# Patient Record
Sex: Female | Born: 1950 | Race: White | Hispanic: No | Smoking: Former smoker
Health system: Southern US, Community
[De-identification: ages and names within clinical notes are randomized; demographics above are authoritative.]

## PROBLEM LIST (undated history)

## (undated) DIAGNOSIS — I10 Essential (primary) hypertension: Secondary | ICD-10-CM

## (undated) DIAGNOSIS — S065XAA Traumatic subdural hemorrhage with loss of consciousness status unknown, initial encounter: Secondary | ICD-10-CM

## (undated) DIAGNOSIS — C3490 Malignant neoplasm of unspecified part of unspecified bronchus or lung: Secondary | ICD-10-CM

## (undated) DIAGNOSIS — F32A Depression, unspecified: Secondary | ICD-10-CM

## (undated) DIAGNOSIS — F0390 Unspecified dementia without behavioral disturbance: Secondary | ICD-10-CM

## (undated) DIAGNOSIS — K219 Gastro-esophageal reflux disease without esophagitis: Secondary | ICD-10-CM

## (undated) DIAGNOSIS — R569 Unspecified convulsions: Secondary | ICD-10-CM

## (undated) DIAGNOSIS — K7460 Unspecified cirrhosis of liver: Secondary | ICD-10-CM

## (undated) DIAGNOSIS — IMO0001 Reserved for inherently not codable concepts without codable children: Secondary | ICD-10-CM

## (undated) DIAGNOSIS — M1990 Unspecified osteoarthritis, unspecified site: Secondary | ICD-10-CM

## (undated) DIAGNOSIS — F329 Major depressive disorder, single episode, unspecified: Secondary | ICD-10-CM

## (undated) DIAGNOSIS — E785 Hyperlipidemia, unspecified: Secondary | ICD-10-CM

## (undated) DIAGNOSIS — C419 Malignant neoplasm of bone and articular cartilage, unspecified: Secondary | ICD-10-CM

## (undated) HISTORY — DX: Malignant neoplasm of bone and articular cartilage, unspecified: C41.9

## (undated) HISTORY — DX: Reserved for inherently not codable concepts without codable children: IMO0001

## (undated) HISTORY — DX: Unspecified convulsions: R56.9

## (undated) HISTORY — DX: Essential (primary) hypertension: I10

## (undated) HISTORY — DX: Malignant neoplasm of unspecified part of unspecified bronchus or lung: C34.90

## (undated) HISTORY — DX: Unspecified osteoarthritis, unspecified site: M19.90

## (undated) HISTORY — DX: Hyperlipidemia, unspecified: E78.5

## (undated) HISTORY — DX: Depression, unspecified: F32.A

## (undated) HISTORY — PX: TONSILLECTOMY: SUR1361

## (undated) HISTORY — DX: Major depressive disorder, single episode, unspecified: F32.9

## (undated) HISTORY — DX: Traumatic subdural hemorrhage with loss of consciousness status unknown, initial encounter: S06.5XAA

---
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
## 1969-02-05 HISTORY — PX: CHOLECYSTECTOMY: SHX55

## 1969-02-05 HISTORY — PX: APPENDECTOMY: SHX54

## 1997-09-17 ENCOUNTER — Ambulatory Visit (HOSPITAL_BASED_OUTPATIENT_CLINIC_OR_DEPARTMENT_OTHER): Admission: RE | Admit: 1997-09-17 | Discharge: 1997-09-17 | Payer: Self-pay | Admitting: Orthopedic Surgery

## 1997-12-23 ENCOUNTER — Inpatient Hospital Stay (HOSPITAL_COMMUNITY): Admission: EM | Admit: 1997-12-23 | Discharge: 1997-12-26 | Payer: Self-pay | Admitting: Emergency Medicine

## 1998-01-26 ENCOUNTER — Other Ambulatory Visit: Admission: RE | Admit: 1998-01-26 | Discharge: 1998-01-26 | Payer: Self-pay

## 1999-04-03 ENCOUNTER — Other Ambulatory Visit: Admission: RE | Admit: 1999-04-03 | Discharge: 1999-04-03 | Payer: Self-pay | Admitting: Internal Medicine

## 1999-05-05 ENCOUNTER — Encounter (INDEPENDENT_AMBULATORY_CARE_PROVIDER_SITE_OTHER): Payer: Self-pay | Admitting: Specialist

## 1999-05-05 ENCOUNTER — Ambulatory Visit (HOSPITAL_COMMUNITY): Admission: RE | Admit: 1999-05-05 | Discharge: 1999-05-05 | Payer: Self-pay | Admitting: *Deleted

## 2004-02-12 ENCOUNTER — Emergency Department: Payer: Self-pay | Admitting: Emergency Medicine

## 2004-06-21 ENCOUNTER — Ambulatory Visit: Payer: Self-pay | Admitting: Internal Medicine

## 2004-06-23 ENCOUNTER — Inpatient Hospital Stay: Payer: Self-pay | Admitting: Surgery

## 2004-06-29 ENCOUNTER — Other Ambulatory Visit: Payer: Self-pay

## 2008-08-18 ENCOUNTER — Emergency Department: Payer: Self-pay | Admitting: Emergency Medicine

## 2009-05-02 ENCOUNTER — Encounter: Payer: Self-pay | Admitting: Sports Medicine

## 2009-05-06 ENCOUNTER — Encounter: Payer: Self-pay | Admitting: Sports Medicine

## 2009-06-05 ENCOUNTER — Encounter: Payer: Self-pay | Admitting: Sports Medicine

## 2009-06-13 ENCOUNTER — Ambulatory Visit: Payer: Self-pay

## 2009-07-06 ENCOUNTER — Encounter: Payer: Self-pay | Admitting: Sports Medicine

## 2009-08-16 ENCOUNTER — Inpatient Hospital Stay (HOSPITAL_COMMUNITY): Admission: EM | Admit: 2009-08-16 | Discharge: 2009-08-17 | Payer: Self-pay | Admitting: Neurosurgery

## 2009-08-16 ENCOUNTER — Ambulatory Visit: Payer: Self-pay | Admitting: Internal Medicine

## 2009-10-07 ENCOUNTER — Encounter: Payer: Self-pay | Admitting: Neurosurgery

## 2009-11-14 ENCOUNTER — Ambulatory Visit: Payer: Self-pay | Admitting: Neurosurgery

## 2009-12-19 ENCOUNTER — Ambulatory Visit (HOSPITAL_COMMUNITY): Admission: RE | Admit: 2009-12-19 | Discharge: 2009-12-19 | Payer: Self-pay | Admitting: Neurosurgery

## 2010-04-18 LAB — SURGICAL PCR SCREEN
MRSA, PCR: NEGATIVE
Staphylococcus aureus: NEGATIVE

## 2010-04-18 LAB — DIFFERENTIAL
Basophils Absolute: 0.1 10*3/uL (ref 0.0–0.1)
Basophils Relative: 1 % (ref 0–1)
Lymphocytes Relative: 32 % (ref 12–46)
Monocytes Absolute: 0.7 10*3/uL (ref 0.1–1.0)
Neutro Abs: 6.4 10*3/uL (ref 1.7–7.7)
Neutrophils Relative %: 57 % (ref 43–77)

## 2010-04-18 LAB — BASIC METABOLIC PANEL
BUN: 11 mg/dL (ref 6–23)
Calcium: 10.1 mg/dL (ref 8.4–10.5)
GFR calc non Af Amer: 53 mL/min — ABNORMAL LOW (ref >60)
Glucose, Bld: 91 mg/dL (ref 70–99)
Potassium: 3.6 mEq/L (ref 3.5–5.1)
Sodium: 137 mEq/L (ref 135–145)

## 2010-04-18 LAB — CBC
HCT: 51.4 % — ABNORMAL HIGH (ref 36.0–46.0)
MCHC: 33.5 g/dL (ref 30.0–36.0)
RDW: 14.9 % (ref 11.5–15.5)
WBC: 11.2 10*3/uL — ABNORMAL HIGH (ref 4.0–10.5)

## 2010-04-23 LAB — BLOOD GAS, ARTERIAL
Acid-base deficit: 2.4 mmol/L — ABNORMAL HIGH (ref 0.0–2.0)
FIO2: 0.5 %
Mode: POSITIVE
O2 Saturation: 93.7 %
TCO2: 25 mmol/L (ref 0–100)
pCO2 arterial: 51.4 mmHg — ABNORMAL HIGH (ref 35.0–45.0)

## 2010-04-23 LAB — CARDIAC PANEL(CRET KIN+CKTOT+MB+TROPI)
Relative Index: INVALID (ref 0.0–2.5)
Troponin I: 0.21 ng/mL — ABNORMAL HIGH (ref 0.00–0.06)
Troponin I: 0.34 ng/mL — ABNORMAL HIGH (ref 0.00–0.06)

## 2010-04-23 LAB — GLUCOSE, CAPILLARY
Glucose-Capillary: 109 mg/dL — ABNORMAL HIGH (ref 70–99)
Glucose-Capillary: 135 mg/dL — ABNORMAL HIGH (ref 70–99)
Glucose-Capillary: 94 mg/dL (ref 70–99)

## 2010-04-23 LAB — CBC
HCT: 39.1 % (ref 36.0–46.0)
Hemoglobin: 13.1 g/dL (ref 12.0–15.0)
MCH: 30.7 pg (ref 26.0–34.0)
MCHC: 33.5 g/dL (ref 30.0–36.0)
MCV: 91.6 fL (ref 78.0–100.0)
RDW: 15.4 % (ref 11.5–15.5)

## 2010-04-23 LAB — COMPREHENSIVE METABOLIC PANEL
ALT: 66 U/L — ABNORMAL HIGH (ref 0–35)
BUN: 13 mg/dL (ref 6–23)
CO2: 26 mEq/L (ref 19–32)
Calcium: 8.4 mg/dL (ref 8.4–10.5)
Creatinine, Ser: 1.19 mg/dL (ref 0.4–1.2)
GFR calc non Af Amer: 46 mL/min — ABNORMAL LOW (ref >60)
Glucose, Bld: 94 mg/dL (ref 70–99)
Sodium: 139 mEq/L (ref 135–145)
Total Protein: 5.8 g/dL — ABNORMAL LOW (ref 6.0–8.3)

## 2010-04-23 LAB — MRSA PCR SCREENING: MRSA by PCR: NEGATIVE

## 2010-04-23 LAB — TSH: TSH: 2.721 u[IU]/mL (ref 0.350–4.500)

## 2010-06-09 ENCOUNTER — Ambulatory Visit: Payer: Self-pay | Admitting: Ophthalmology

## 2010-06-09 DIAGNOSIS — I119 Hypertensive heart disease without heart failure: Secondary | ICD-10-CM

## 2010-06-26 ENCOUNTER — Ambulatory Visit: Payer: Self-pay | Admitting: Ophthalmology

## 2010-07-11 ENCOUNTER — Ambulatory Visit: Payer: Self-pay | Admitting: Ophthalmology

## 2010-07-17 ENCOUNTER — Ambulatory Visit: Payer: Self-pay | Admitting: Ophthalmology

## 2011-07-20 ENCOUNTER — Emergency Department: Payer: Self-pay | Admitting: Emergency Medicine

## 2012-02-26 ENCOUNTER — Ambulatory Visit: Payer: Self-pay | Admitting: Neurosurgery

## 2012-02-26 LAB — CREATININE, SERUM
EGFR (African American): >60
EGFR (Non-African Amer.): 54 — ABNORMAL LOW

## 2013-10-11 ENCOUNTER — Emergency Department: Payer: Self-pay | Admitting: Emergency Medicine

## 2015-02-18 ENCOUNTER — Ambulatory Visit: Payer: Self-pay | Admitting: Nurse Practitioner

## 2015-02-18 ENCOUNTER — Ambulatory Visit (INDEPENDENT_AMBULATORY_CARE_PROVIDER_SITE_OTHER): Payer: PPO | Admitting: Nurse Practitioner

## 2015-02-18 ENCOUNTER — Encounter: Payer: Self-pay | Admitting: Nurse Practitioner

## 2015-02-18 VITALS — BP 116/68 | HR 89 | Temp 98.3°F | Resp 16 | Ht 65.0 in | Wt 206.2 lb

## 2015-02-18 DIAGNOSIS — Z13820 Encounter for screening for osteoporosis: Secondary | ICD-10-CM

## 2015-02-18 DIAGNOSIS — F32A Depression, unspecified: Secondary | ICD-10-CM

## 2015-02-18 DIAGNOSIS — Z1239 Encounter for other screening for malignant neoplasm of breast: Secondary | ICD-10-CM

## 2015-02-18 DIAGNOSIS — F329 Major depressive disorder, single episode, unspecified: Secondary | ICD-10-CM

## 2015-02-18 DIAGNOSIS — Z7189 Other specified counseling: Secondary | ICD-10-CM | POA: Diagnosis not present

## 2015-02-18 DIAGNOSIS — I10 Essential (primary) hypertension: Secondary | ICD-10-CM

## 2015-02-18 DIAGNOSIS — E785 Hyperlipidemia, unspecified: Secondary | ICD-10-CM

## 2015-02-18 DIAGNOSIS — Z7689 Persons encountering health services in other specified circumstances: Secondary | ICD-10-CM

## 2015-02-18 NOTE — Patient Instructions (Addendum)
Bone density (every 5 years)  Breast cancer screening every 1-2 years  Please call and set up your appointment- orders are in!  [facility] at [facility]  Address: [address] Madelaine Bhat [place], [place] [address]  Phone: [phone] Hours:  Monday - Thursday: 8 a.m. - 5 p.m. Friday: 8 a.m. - 3 p.m.

## 2015-02-18 NOTE — Progress Notes (Addendum)
Patient ID: Anne [name], female    DOB: [birth year]  Age: 65 y.o. MRN: [id number]  CC: Establish Care   HPI Moca presents for establishing care today.   1) New Pt Info:   Immunizations- Unknown   Mammogram- Will obtain   Bone Density- Will obtain  Colonoscopy- Never had  History Jonda has a past medical history of Hypertension; Hyperlipidemia; Depression; and Arthritis.   She has past surgical history that includes Cholecystectomy (1971); Appendectomy (1971); and Tonsilectomy, adenoidectomy, bilateral myringotomy and tubes (1955).   Her family history includes Cancer in her mother; Heart disease in her father; Hypertension in her mother; Stroke in her father.She reports that she quit smoking about 3 years ago. She does not have any smokeless tobacco history on file. She reports that she drinks alcohol. She reports that she does not use illicit drugs.  No outpatient prescriptions prior to visit.   No facility-administered medications prior to visit.    ROS Review of Systems  Constitutional: Negative for fever, chills, diaphoresis and fatigue.  Respiratory: Negative for chest tightness, shortness of breath and wheezing.   Cardiovascular: Negative for chest pain, palpitations and leg swelling.  Gastrointestinal: Negative for nausea, vomiting and diarrhea.  Skin: Negative for rash.  Neurological: Negative for dizziness, weakness, numbness and headaches.  Psychiatric/Behavioral: The patient is not nervous/anxious.     Objective:  BP 116/68 mmHg  Pulse 89  Temp(Src) 98.3 F (36.8 C) (Oral)  Resp 16  Ht '5\' 5"'$  (1.651 m)  Wt 206 lb 3.2 oz (93.532 kg)  BMI 34.31 kg/m2  SpO2 91%  Physical Exam  Constitutional: She is oriented to person, place, and time. She appears well-developed and well-nourished. No distress.  HENT:  Head: Normocephalic and atraumatic.  Right Ear: External ear normal.  Left Ear: External ear normal.  Cardiovascular: Normal rate, regular rhythm  and normal heart sounds.  Exam reveals no gallop and no friction rub.   No murmur heard. Pulmonary/Chest: Effort normal and breath sounds normal. No respiratory distress. She has no wheezes. She has no rales. She exhibits no tenderness.  Neurological: She is alert and oriented to person, place, and time. No cranial nerve deficit. She exhibits normal muscle tone. Coordination normal.  Skin: Skin is warm and dry. No rash noted. She is not diaphoretic.  Psychiatric: She has a normal mood and affect. Her behavior is normal. Judgment and thought content normal.      Assessment & Plan:   Anne [name] was seen today for establish care.  Diagnoses and all orders for this visit:  Screening for breast cancer -     MM Digital Screening; Future  Screening for osteoporosis -     DG Bone Density; Future  Encounter to establish care  Benign essential HTN  HLD (hyperlipidemia)  Depression   I have discontinued Anne [name]'s Vitamin D (Ergocalciferol). I am also having her maintain her lovastatin, DHA-Vitamin C-Lutein (EYE HEALTH FORMULA PO), triamterene-hydrochlorothiazide, PARoxetine, FISH OIL + D3, St Johns Wort, and Vitamin D3.  Meds ordered this encounter  Medications  . lovastatin (MEVACOR) 20 MG tablet    Sig: Take 20 mg by mouth at bedtime.  Marland Kitchen DHA-Vitamin C-Lutein (EYE HEALTH FORMULA PO)    Sig: Take by mouth.  . triamterene-hydrochlorothiazide (MAXZIDE-25) 37.5-25 MG tablet    Sig: Take 1 tablet by mouth daily.  Marland Kitchen PARoxetine (PAXIL) 20 MG tablet    Sig: Take 20 mg by mouth daily.  Marland Kitchen DISCONTD: Vitamin D, Ergocalciferol, (DRISDOL) 50000 units  CAPS capsule    Sig: Take 50,000 Units by mouth every 7 (seven) days.  . Fish Oil-Cholecalciferol (FISH OIL + D3) 1000-1000 MG-UNIT CAPS    Sig: Take by mouth.  Francella Solian Johns Wort 150 MG CAPS    Sig: Take 1 capsule by mouth daily.  . Cholecalciferol (VITAMIN D3) 1000 units CAPS    Sig: Take 1 capsule by mouth daily.     Follow-up: Return in  about 4 weeks (around 03/18/2015) for Follow up for health maintanence .

## 2015-02-22 ENCOUNTER — Encounter: Payer: Self-pay | Admitting: Nurse Practitioner

## 2015-02-22 DIAGNOSIS — F32A Depression, unspecified: Secondary | ICD-10-CM | POA: Insufficient documentation

## 2015-02-22 DIAGNOSIS — I10 Essential (primary) hypertension: Secondary | ICD-10-CM | POA: Insufficient documentation

## 2015-02-22 DIAGNOSIS — E785 Hyperlipidemia, unspecified: Secondary | ICD-10-CM | POA: Insufficient documentation

## 2015-02-22 DIAGNOSIS — Z13820 Encounter for screening for osteoporosis: Secondary | ICD-10-CM | POA: Insufficient documentation

## 2015-02-22 DIAGNOSIS — F329 Major depressive disorder, single episode, unspecified: Secondary | ICD-10-CM | POA: Insufficient documentation

## 2015-02-22 DIAGNOSIS — Z7689 Persons encountering health services in other specified circumstances: Secondary | ICD-10-CM | POA: Insufficient documentation

## 2015-02-22 DIAGNOSIS — Z1239 Encounter for other screening for malignant neoplasm of breast: Secondary | ICD-10-CM | POA: Insufficient documentation

## 2015-02-22 NOTE — Assessment & Plan Note (Signed)
Ordered Asked pt to call and set up See AVS

## 2015-02-22 NOTE — Assessment & Plan Note (Signed)
Discussed acute and chronic issues. Reviewed health maintenance measures, PFSHx, and immunizations. Obtain records from Dr. Lewie Loron office

## 2015-02-22 NOTE — Assessment & Plan Note (Signed)
Stable. On Mevacor 30 mg Will obtain last labs

## 2015-02-22 NOTE — Assessment & Plan Note (Signed)
BP Readings from Last 3 Encounters:  02/18/15 116/68   Stable. Continue current measures

## 2015-02-22 NOTE — Assessment & Plan Note (Signed)
Stable. On Paxil 20 mg and St. Johns wort Will obtain records

## 2015-02-22 NOTE — Assessment & Plan Note (Signed)
See AVS Ordered Asked pt to set up

## 2015-02-25 ENCOUNTER — Other Ambulatory Visit: Payer: Self-pay

## 2015-02-25 MED ORDER — LOVASTATIN 20 MG PO TABS: 20.0000 mg | ORAL_TABLET | Freq: Every day | ORAL | Status: DC

## 2015-03-03 ENCOUNTER — Ambulatory Visit: Payer: PPO

## 2015-03-09 ENCOUNTER — Ambulatory Visit
Admission: RE | Admit: 2015-03-09 | Discharge: 2015-03-09 | Disposition: A | Discharge status: Home / Self Care | Payer: PPO | Source: Ambulatory Visit | Attending: Nurse Practitioner | Admitting: Nurse Practitioner

## 2015-03-09 DIAGNOSIS — Z1231 Encounter for screening mammogram for malignant neoplasm of breast: Secondary | ICD-10-CM | POA: Insufficient documentation

## 2015-03-09 DIAGNOSIS — M810 Age-related osteoporosis without current pathological fracture: Secondary | ICD-10-CM | POA: Diagnosis not present

## 2015-03-09 DIAGNOSIS — Z1239 Encounter for other screening for malignant neoplasm of breast: Secondary | ICD-10-CM

## 2015-03-09 DIAGNOSIS — Z13820 Encounter for screening for osteoporosis: Secondary | ICD-10-CM | POA: Diagnosis not present

## 2015-03-16 ENCOUNTER — Other Ambulatory Visit: Payer: Self-pay

## 2015-03-16 MED ORDER — TRIAMTERENE-HCTZ 37.5-25 MG PO TABS: 1.0000 | ORAL_TABLET | Freq: Every day | ORAL | Status: DC

## 2015-03-16 NOTE — Telephone Encounter (Signed)
You have not prescribed this for the patient before, please advise.  Last OV was 02/18/2015

## 2015-03-17 ENCOUNTER — Ambulatory Visit (INDEPENDENT_AMBULATORY_CARE_PROVIDER_SITE_OTHER): Payer: PPO | Admitting: Nurse Practitioner

## 2015-03-17 ENCOUNTER — Encounter: Payer: Self-pay | Admitting: Nurse Practitioner

## 2015-03-17 VITALS — BP 138/70 | HR 81 | Temp 98.0°F | Resp 14 | Ht 65.0 in | Wt 210.8 lb

## 2015-03-17 DIAGNOSIS — E785 Hyperlipidemia, unspecified: Secondary | ICD-10-CM

## 2015-03-17 DIAGNOSIS — Z1239 Encounter for other screening for malignant neoplasm of breast: Secondary | ICD-10-CM | POA: Diagnosis not present

## 2015-03-17 DIAGNOSIS — Z13820 Encounter for screening for osteoporosis: Secondary | ICD-10-CM

## 2015-03-17 DIAGNOSIS — I10 Essential (primary) hypertension: Secondary | ICD-10-CM | POA: Diagnosis not present

## 2015-03-17 LAB — CBC WITH DIFFERENTIAL/PLATELET
BASOS ABS: 0 10*3/uL (ref 0.0–0.1)
BASOS PCT: 0.3 % (ref 0.0–3.0)
EOS ABS: 0.2 10*3/uL (ref 0.0–0.7)
Eosinophils Relative: 3 % (ref 0.0–5.0)
HEMATOCRIT: 41.2 % (ref 36.0–46.0)
HEMOGLOBIN: 13.3 g/dL (ref 12.0–15.0)
LYMPHS ABS: 2 10*3/uL (ref 0.7–4.0)
LYMPHS PCT: 33.1 % (ref 12.0–46.0)
MCHC: 32.3 g/dL (ref 30.0–36.0)
MCV: 85.3 fl (ref 78.0–100.0)
MONOS PCT: 5.2 % (ref 3.0–12.0)
Monocytes Absolute: 0.3 10*3/uL (ref 0.1–1.0)
NEUTROS ABS: 3.6 10*3/uL (ref 1.4–7.7)
Neutrophils Relative %: 58.4 % (ref 43.0–77.0)
Platelets: 187 10*3/uL (ref 150.0–400.0)
RBC: 4.83 Mil/uL (ref 3.87–5.11)
RDW: 15.5 % (ref 11.5–15.5)
WBC: 6.2 10*3/uL (ref 4.0–10.5)

## 2015-03-17 LAB — BASIC METABOLIC PANEL
BUN: 15 mg/dL (ref 6–23)
CHLORIDE: 102 meq/L (ref 96–112)
CO2: 29 mEq/L (ref 19–32)
Calcium: 9.8 mg/dL (ref 8.4–10.5)
Creatinine, Ser: 0.86 mg/dL (ref 0.40–1.20)
GFR: 69.95 mL/min (ref >60.00)
GLUCOSE: 98 mg/dL (ref 70–99)
POTASSIUM: 3.6 meq/L (ref 3.5–5.1)
Sodium: 139 mEq/L (ref 135–145)

## 2015-03-17 LAB — LIPID PANEL
CHOL/HDL RATIO: 4
Cholesterol: 205 mg/dL — ABNORMAL HIGH (ref 0–200)
HDL: 47.2 mg/dL (ref >39.00)
NONHDL: 157.66
Triglycerides: 244 mg/dL — ABNORMAL HIGH (ref 0.0–149.0)
VLDL: 48.8 mg/dL — ABNORMAL HIGH (ref 0.0–40.0)

## 2015-03-17 LAB — LDL CHOLESTEROL, DIRECT: LDL DIRECT: 118 mg/dL

## 2015-03-17 LAB — HEMOGLOBIN A1C: Hgb A1c MFr Bld: 6 % (ref 4.6–6.5)

## 2015-03-17 NOTE — Patient Instructions (Addendum)
Please visit the lab today.   Good to see you!   If you need a physical or wellness visit- please set this up before leaving.

## 2015-03-17 NOTE — Progress Notes (Signed)
Pre visit review using our clinic review tool, if applicable. No additional management support is needed unless otherwise documented below in the visit note. 

## 2015-03-17 NOTE — Progress Notes (Signed)
Patient ID: Anne [name], female    DOB: 1950/02/10  Age: 65 y.o. MRN: [id number]  CC: Follow-up   HPI Anne [name] presents for follow up of mammogram and bone density results.   1) Bone Density- normal  ASSESSMENT: The BMD measured at AP Spine L1-L3 is 1.167 g/cm2 with a T-score of -0.1. This patient is considered normal according to [facility]) criteria. L- 4was excluded due to degenerative changes.  AP Spine L1-L3 03/09/2015 66.9 Normal -0.1 1.167 g/cm2 DualFemur Neck Left 03/09/2015 66.9 Normal 0.0 1.035 g/cm2   2) Mammogram- normal results Bi-Rads 1 with fatty tissue  Pt has questions about what this means  3) HTN/HLD- stable on current regimens- needs lab work   History Anne [name] has a past medical history of Hypertension; Hyperlipidemia; Depression; and Arthritis.   Anne [name] has past surgical history that includes Cholecystectomy (1971); Appendectomy (1971); and Tonsilectomy, adenoidectomy, bilateral myringotomy and tubes (1955).   Her family history includes Cancer in her mother; Heart disease in her father; Hypertension in her mother; Ovarian cancer in her sister; Ovarian cancer (age of onset: 68) in her mother; Stroke in her father. There is no history of Breast cancer.Anne [name] reports that Anne [name] quit smoking about 3 years ago. Anne [name] does not have any smokeless tobacco history on file. Anne [name] reports that Anne [name] drinks alcohol. Anne [name] reports that Anne [name] does not use illicit drugs.  Outpatient Prescriptions Prior to Visit  Medication Sig Dispense Refill  . Cholecalciferol (VITAMIN D3) 1000 units CAPS Take 1 capsule by mouth daily.    Marland Kitchen DHA-Vitamin C-Lutein (EYE HEALTH FORMULA PO) Take by mouth.    . Fish Oil-Cholecalciferol (FISH OIL + D3) 1000-1000 MG-UNIT CAPS Take by mouth.    . lovastatin (MEVACOR) 20 MG tablet Take 1 tablet (20 mg total) by mouth at bedtime. 60 tablet 3  . PARoxetine (PAXIL) 20 MG tablet Take 20 mg by mouth daily.    Francella Solian Johns Wort 150 MG CAPS Take 1  capsule by mouth daily.    Marland Kitchen triamterene-hydrochlorothiazide (MAXZIDE-25) 37.5-25 MG tablet Take 1 tablet by mouth daily. 30 tablet 2   No facility-administered medications prior to visit.    ROS Review of Systems  Constitutional: Negative for fever, chills, diaphoresis and fatigue.  Musculoskeletal: Negative for myalgias, back pain, joint swelling, arthralgias, gait problem, neck pain and neck stiffness.    Objective:  BP 138/70 mmHg  Pulse 81  Temp(Src) 98 F (36.7 C) (Oral)  Resp 14  Ht '5\' 5"'$  (1.651 m)  Wt 210 lb 12.8 oz (95.618 kg)  BMI 35.08 kg/m2  SpO2 95%  Physical Exam  Constitutional: Anne [name] is oriented to person, place, and time. Anne [name] appears well-developed and well-nourished. No distress.  Cardiovascular: Normal rate and regular rhythm.   Pulmonary/Chest: Effort normal and breath sounds normal.  Neurological: Anne [name] is alert and oriented to person, place, and time. Coordination normal.  Skin: Skin is warm and dry. No rash noted. Anne [name] is not diaphoretic.  Psychiatric: Anne [name] has a normal mood and affect. Her behavior is normal. Judgment and thought content normal.   Assessment & Plan:   Anne [name] was seen today for follow-up.  Diagnoses and all orders for this visit:  Benign essential HTN -     CBC with Differential/Platelet -     Basic metabolic panel -     Hemoglobin A1c -     Lipid panel  HLD (hyperlipidemia) -     CBC with Differential/Platelet -  Basic metabolic panel -     Hemoglobin A1c -     Lipid panel  Screening for osteoporosis  Screening for breast cancer  Other orders -     LDL cholesterol, direct   I am having Anne [name] maintain her DHA-Vitamin C-Lutein (EYE HEALTH FORMULA PO), PARoxetine, FISH OIL + D3, St Johns Wort, Vitamin D3, lovastatin, triamterene-hydrochlorothiazide, and cyclobenzaprine.  Meds ordered this encounter  Medications  . cyclobenzaprine (FLEXERIL) 10 MG tablet    Sig: Take 10 mg by mouth at bedtime.     Follow-up:  Return if symptoms worsen or fail to improve.

## 2015-03-20 NOTE — Assessment & Plan Note (Addendum)
Discussed results Encouraged pt to continue her vit D and calcium supplementation.  Repeat in 5 yrs

## 2015-03-20 NOTE — Assessment & Plan Note (Signed)
Discussed results with pt She had questions about fatty breast tissue. All questions answered to satisfaction Repeat in 1-2 years per pt preference.

## 2015-03-20 NOTE — Assessment & Plan Note (Signed)
On Lovastatin currently Checking labs today

## 2015-03-20 NOTE — Assessment & Plan Note (Signed)
BP Readings from Last 3 Encounters:  03/17/15 138/70  02/18/15 116/68   Will check labs today

## 2015-04-11 ENCOUNTER — Encounter: Payer: Self-pay | Admitting: Nurse Practitioner

## 2015-04-11 ENCOUNTER — Ambulatory Visit (INDEPENDENT_AMBULATORY_CARE_PROVIDER_SITE_OTHER): Payer: PPO | Admitting: Nurse Practitioner

## 2015-04-11 VITALS — BP 116/70 | HR 78 | Temp 98.0°F | Resp 16 | Ht 65.0 in | Wt 211.4 lb

## 2015-04-11 DIAGNOSIS — E785 Hyperlipidemia, unspecified: Secondary | ICD-10-CM

## 2015-04-11 MED ORDER — LOVASTATIN 40 MG PO TABS: 40.0000 mg | ORAL_TABLET | Freq: Every day | ORAL | Status: DC

## 2015-04-11 NOTE — Progress Notes (Signed)
Patient ID: Anne [name], female    DOB: 1951/01/09  Age: 65 y.o. MRN: [id number]  CC: Follow-up   HPI Chrisoula Zegarra Vader presents for follow up of labs.   1) Pt had labs on 03/17/15  Taking 20 mg only of Lovastatin currently because it was sent in incorrectly. Pt was taken 2 tablets at night previously  History Simi has a past medical history of Hypertension; Hyperlipidemia; Depression; and Arthritis.   She has past surgical history that includes Cholecystectomy (1971); Appendectomy (1971); and Tonsilectomy, adenoidectomy, bilateral myringotomy and tubes (1955).   Her family history includes Cancer in her mother; Heart disease in her father; Hypertension in her mother; Ovarian cancer in her sister; Ovarian cancer (age of onset: 75) in her mother; Stroke in her father. There is no history of Breast cancer.She reports that she quit smoking about 3 years ago. She does not have any smokeless tobacco history on file. She reports that she drinks alcohol. She reports that she does not use illicit drugs.  Outpatient Prescriptions Prior to Visit  Medication Sig Dispense Refill  . Cholecalciferol (VITAMIN D3) 1000 units CAPS Take 1 capsule by mouth daily.    . cyclobenzaprine (FLEXERIL) 10 MG tablet Take 10 mg by mouth at bedtime.    Marland Kitchen DHA-Vitamin C-Lutein (EYE HEALTH FORMULA PO) Take by mouth.    . Fish Oil-Cholecalciferol (FISH OIL + D3) 1000-1000 MG-UNIT CAPS Take by mouth.    Marland Kitchen PARoxetine (PAXIL) 20 MG tablet Take 20 mg by mouth daily.    Francella Solian Johns Wort 150 MG CAPS Take 1 capsule by mouth daily.    Marland Kitchen triamterene-hydrochlorothiazide (MAXZIDE-25) 37.5-25 MG tablet Take 1 tablet by mouth daily. 30 tablet 2  . lovastatin (MEVACOR) 20 MG tablet Take 1 tablet (20 mg total) by mouth at bedtime. 60 tablet 3   No facility-administered medications prior to visit.    ROS Review of Systems  Constitutional: Negative for fever, chills, diaphoresis and fatigue.  Respiratory: Negative for chest tightness,  shortness of breath and wheezing.   Cardiovascular: Negative for chest pain, palpitations and leg swelling.  Gastrointestinal: Negative for nausea, vomiting and diarrhea.  Skin: Negative for rash.  Neurological: Negative for dizziness, weakness, numbness and headaches.  Psychiatric/Behavioral: The patient is not nervous/anxious.     Objective:  BP 116/70 mmHg  Pulse 78  Temp(Src) 98 F (36.7 C) (Oral)  Resp 16  Ht '5\' 5"'$  (1.651 m)  Wt 211 lb 6.4 oz (95.89 kg)  BMI 35.18 kg/m2  SpO2 95%  Physical Exam  Constitutional: She is oriented to person, place, and time. She appears well-developed and well-nourished. No distress.  HENT:  Head: Normocephalic and atraumatic.  Right Ear: External ear normal.  Left Ear: External ear normal.  Eyes: EOM are normal. Pupils are equal, round, and reactive to light. Right eye exhibits no discharge. Left eye exhibits no discharge. No scleral icterus.  Cardiovascular: Normal rate, regular rhythm and normal heart sounds.  Exam reveals no gallop and no friction rub.   No murmur heard. Pulmonary/Chest: Effort normal and breath sounds normal. No respiratory distress. She has no wheezes. She has no rales. She exhibits no tenderness.  Neurological: She is alert and oriented to person, place, and time. No cranial nerve deficit. She exhibits normal muscle tone. Coordination normal.  Skin: Skin is warm and dry. No rash noted. She is not diaphoretic.  Psychiatric: She has a normal mood and affect. Her behavior is normal. Judgment and thought content normal.  Assessment & Plan:   Kendel was seen today for follow-up.  Diagnoses and all orders for this visit:  HLD (hyperlipidemia)  Other orders -     lovastatin (MEVACOR) 40 MG tablet; Take 1 tablet (40 mg total) by mouth at bedtime.  I have discontinued Ms. Eppolito's lovastatin. I am also having her start on lovastatin. Additionally, I am having her maintain her DHA-Vitamin C-Lutein ([place]),  PARoxetine, FISH OIL + D3, St Johns Wort, Vitamin D3, triamterene-hydrochlorothiazide, and cyclobenzaprine.  Meds ordered this encounter  Medications  . lovastatin (MEVACOR) 40 MG tablet    Sig: Take 1 tablet (40 mg total) by mouth at bedtime.    Dispense:  30 tablet    Refill:  5    Order Specific Question:  Supervising Provider    Answer:  Crecencio Mc [[id number]]     Follow-up: Return if symptoms worsen or fail to improve.

## 2015-04-11 NOTE — Assessment & Plan Note (Signed)
Upped to Lovastatin 40 mg once at night  Encouraged healthy diet and exercise Continue Fish oil

## 2015-04-11 NOTE — Patient Instructions (Signed)
Your lovastatin was upped to 40 mg at night 1 tablet   See you when you need me.

## 2015-04-20 ENCOUNTER — Other Ambulatory Visit: Payer: Self-pay

## 2015-04-20 NOTE — Telephone Encounter (Signed)
Pt states the primary doctor retired and now she need a new script, Dr. Elsie Saas is the person who orginally worte this script and pt forgot to mention this at her visit. Please advise if okay to fill, thanks

## 2015-04-21 MED ORDER — MELOXICAM 15 MG PO TABS: 15.0000 mg | ORAL_TABLET | Freq: Every day | ORAL | Status: DC

## 2015-05-04 ENCOUNTER — Encounter: Payer: Self-pay | Admitting: Nurse Practitioner

## 2015-05-04 ENCOUNTER — Other Ambulatory Visit: Payer: Self-pay | Admitting: Nurse Practitioner

## 2015-05-04 MED ORDER — CYCLOBENZAPRINE HCL 10 MG PO TABS: 10.0000 mg | ORAL_TABLET | Freq: Every day | ORAL | Status: DC

## 2015-06-14 ENCOUNTER — Ambulatory Visit
Admission: RE | Admit: 2015-06-14 | Discharge: 2015-06-14 | Disposition: A | Discharge status: Home / Self Care | Payer: PPO | Source: Ambulatory Visit | Attending: Nurse Practitioner | Admitting: Nurse Practitioner

## 2015-06-14 ENCOUNTER — Encounter: Payer: Self-pay | Admitting: Nurse Practitioner

## 2015-06-14 ENCOUNTER — Ambulatory Visit (INDEPENDENT_AMBULATORY_CARE_PROVIDER_SITE_OTHER): Payer: PPO | Admitting: Nurse Practitioner

## 2015-06-14 VITALS — BP 130/80 | HR 81 | Temp 97.8°F | Ht 65.0 in | Wt 214.0 lb

## 2015-06-14 DIAGNOSIS — M25511 Pain in right shoulder: Secondary | ICD-10-CM

## 2015-06-14 NOTE — Patient Instructions (Signed)
Good to see you! Anne [name], [place] [id number] Hours of Operation Monday - Friday, 8 a.m. - 5 p.m. Main: [phone]   Walk- in any time during the hours above   We will see what needs to happen after we get results.

## 2015-06-14 NOTE — Assessment & Plan Note (Signed)
New onset x 2 weeks X-ray, discussed future MRI Referral to ortho since I will be leaving soon Pt husband sees Dr. Caryl Bis and pt will establish with him moving forward for follow up.  Meloxicam, ice, rest, and mild stretching.

## 2015-06-14 NOTE — Progress Notes (Signed)
Patient ID: Anne [name], female    DOB: 1951-01-29  Age: 65 y.o. MRN: [id number]  CC: Shoulder Pain   HPI Anne [name] presents for CC of right shoulder pain.   1) RIGHT shoulder pain  Onset- 2 weeks ago, noticed when she woke up  Location- bicep, trap, and deltoid- joint  Duration- constant  Characteristics- stabbing  Aggravating factors- "all the same"  Relieving factors- sleeping, and meloxicam  Severity- 9/10 worst, best when sleeping mild   Denies trauma, denies changes in position for sleep   Treatment to date: Mobic- 1-2 x a week Flexeril- twice a week  History Anne [name] has a past medical history of Hypertension; Hyperlipidemia; Depression; and Arthritis.   She has past surgical history that includes Cholecystectomy (1971); Appendectomy (1971); and Tonsilectomy, adenoidectomy, bilateral myringotomy and tubes (1955).   Her family history includes Cancer in her mother; Heart disease in her father; Hypertension in her mother; Ovarian cancer in her sister; Ovarian cancer (age of onset: 18) in her mother; Stroke in her father. There is no history of Breast cancer.She reports that she quit smoking about 3 years ago. She does not have any smokeless tobacco history on file. She reports that she drinks alcohol. She reports that she does not use illicit drugs.  Outpatient Prescriptions Prior to Visit  Medication Sig Dispense Refill  . Cholecalciferol (VITAMIN D3) 1000 units CAPS Take 1 capsule by mouth daily.    . cyclobenzaprine (FLEXERIL) 10 MG tablet Take 1 tablet (10 mg total) by mouth at bedtime. 30 tablet 1  . DHA-Vitamin C-Lutein (EYE HEALTH FORMULA PO) Take by mouth.    . Fish Oil-Cholecalciferol (FISH OIL + D3) 1000-1000 MG-UNIT CAPS Take by mouth.    . lovastatin (MEVACOR) 40 MG tablet Take 1 tablet (40 mg total) by mouth at bedtime. 30 tablet 5  . meloxicam (MOBIC) 15 MG tablet Take 1 tablet (15 mg total) by mouth daily. 30 tablet 1  . PARoxetine (PAXIL) 20 MG tablet Take  20 mg by mouth daily.    Anne [name] 150 MG CAPS Take 1 capsule by mouth daily.    Marland Kitchen triamterene-hydrochlorothiazide (MAXZIDE-25) 37.5-25 MG tablet Take 1 tablet by mouth daily. 30 tablet 2   No facility-administered medications prior to visit.    ROS Review of Systems  Constitutional: Negative for fever, chills, diaphoresis and fatigue.  Respiratory: Negative for chest tightness, shortness of breath and wheezing.   Cardiovascular: Negative for chest pain, palpitations and leg swelling.  Gastrointestinal: Negative for nausea, vomiting and diarrhea.  Musculoskeletal: Positive for arthralgias.       Right shoulder pain  Skin: Negative for rash.  Neurological: Negative for dizziness, weakness, numbness and headaches.  Psychiatric/Behavioral: The patient is not nervous/anxious.     Objective:  BP 130/80 mmHg  Pulse 81  Temp(Src) 97.8 F (36.6 C) (Oral)  Ht '5\' 5"'$  (1.651 m)  Wt 214 lb (97.07 kg)  BMI 35.61 kg/m2  SpO2 95%  Physical Exam  Constitutional: She is oriented to person, place, and time. She appears well-developed and well-nourished. No distress.  HENT:  Head: Normocephalic and atraumatic.  Right Ear: External ear normal.  Left Ear: External ear normal.  Cardiovascular: Normal rate, regular rhythm and normal heart sounds.   Pulmonary/Chest: Effort normal and breath sounds normal. No respiratory distress. She has no wheezes. She has no rales. She exhibits no tenderness.  Musculoskeletal: Normal range of motion. She exhibits tenderness. She exhibits no edema.  Arms: Red areas= tender spots Pt reported pain with external rotation against resistance. Full ROM bilaterally Neg. Impingement sign  Neurological: She is alert and oriented to person, place, and time. No cranial nerve deficit. She exhibits normal muscle tone. Coordination normal.  Skin: Skin is warm and dry. No rash noted. She is not diaphoretic.  Psychiatric: She has a normal mood and affect. Her  behavior is normal. Judgment and thought content normal.   Assessment & Plan:   Anne [name] was seen today for shoulder pain.  Diagnoses and all orders for this visit:  Right shoulder pain -     DG Shoulder Right; Future   I am having Anne [name] maintain her DHA-Vitamin C-Lutein (EYE HEALTH FORMULA PO), PARoxetine, FISH OIL + D3, [name], Vitamin D3, triamterene-hydrochlorothiazide, lovastatin, meloxicam, and cyclobenzaprine.  No orders of the defined types were placed in this encounter.     Follow-up: Return if symptoms worsen or fail to improve.

## 2015-06-14 NOTE — Progress Notes (Signed)
Pre visit review using our clinic review tool, if applicable. No additional management support is needed unless otherwise documented below in the visit note. 

## 2015-06-15 ENCOUNTER — Encounter: Payer: Self-pay | Admitting: Nurse Practitioner

## 2015-06-15 ENCOUNTER — Other Ambulatory Visit: Payer: Self-pay | Admitting: Family Medicine

## 2015-06-15 ENCOUNTER — Other Ambulatory Visit: Payer: Self-pay | Admitting: Nurse Practitioner

## 2015-06-15 MED ORDER — PAROXETINE HCL 20 MG PO TABS: 20.0000 mg | ORAL_TABLET | Freq: Every day | ORAL | Status: DC

## 2015-06-15 NOTE — Telephone Encounter (Signed)
You have not prescribed this medication. Last OV 06/14/2015 Please Advise

## 2015-06-17 DIAGNOSIS — M531 Cervicobrachial syndrome: Secondary | ICD-10-CM | POA: Diagnosis not present

## 2015-06-17 DIAGNOSIS — M9901 Segmental and somatic dysfunction of cervical region: Secondary | ICD-10-CM | POA: Diagnosis not present

## 2015-06-24 DIAGNOSIS — M9901 Segmental and somatic dysfunction of cervical region: Secondary | ICD-10-CM | POA: Diagnosis not present

## 2015-06-24 DIAGNOSIS — M531 Cervicobrachial syndrome: Secondary | ICD-10-CM | POA: Diagnosis not present

## 2015-07-08 DIAGNOSIS — M531 Cervicobrachial syndrome: Secondary | ICD-10-CM | POA: Diagnosis not present

## 2015-07-08 DIAGNOSIS — M9901 Segmental and somatic dysfunction of cervical region: Secondary | ICD-10-CM | POA: Diagnosis not present

## 2015-08-15 ENCOUNTER — Ambulatory Visit (INDEPENDENT_AMBULATORY_CARE_PROVIDER_SITE_OTHER): Payer: PPO | Admitting: Family Medicine

## 2015-08-15 ENCOUNTER — Encounter: Payer: Self-pay | Admitting: Family Medicine

## 2015-08-15 VITALS — BP 128/76 | HR 85 | Temp 98.1°F | Ht 65.0 in | Wt 214.8 lb

## 2015-08-15 DIAGNOSIS — F329 Major depressive disorder, single episode, unspecified: Secondary | ICD-10-CM | POA: Diagnosis not present

## 2015-08-15 DIAGNOSIS — M545 Low back pain, unspecified: Secondary | ICD-10-CM | POA: Insufficient documentation

## 2015-08-15 DIAGNOSIS — E785 Hyperlipidemia, unspecified: Secondary | ICD-10-CM

## 2015-08-15 DIAGNOSIS — I10 Essential (primary) hypertension: Secondary | ICD-10-CM | POA: Diagnosis not present

## 2015-08-15 DIAGNOSIS — F32A Depression, unspecified: Secondary | ICD-10-CM

## 2015-08-15 DIAGNOSIS — G8929 Other chronic pain: Secondary | ICD-10-CM | POA: Insufficient documentation

## 2015-08-15 LAB — COMPREHENSIVE METABOLIC PANEL
ALT: 27 U/L (ref 0–35)
AST: 35 U/L (ref 0–37)
Albumin: 4.2 g/dL (ref 3.5–5.2)
Alkaline Phosphatase: 120 U/L — ABNORMAL HIGH (ref 39–117)
BUN: 12 mg/dL (ref 6–23)
CHLORIDE: 101 meq/L (ref 96–112)
CO2: 28 meq/L (ref 19–32)
CREATININE: 0.91 mg/dL (ref 0.40–1.20)
Calcium: 10 mg/dL (ref 8.4–10.5)
GFR: 65.46 mL/min (ref >60.00)
GLUCOSE: 97 mg/dL (ref 70–99)
POTASSIUM: 3.8 meq/L (ref 3.5–5.1)
SODIUM: 138 meq/L (ref 135–145)
Total Bilirubin: 0.5 mg/dL (ref 0.2–1.2)
Total Protein: 7.8 g/dL (ref 6.0–8.3)

## 2015-08-15 LAB — LDL CHOLESTEROL, DIRECT: Direct LDL: 131 mg/dL

## 2015-08-15 MED ORDER — CYCLOBENZAPRINE HCL 10 MG PO TABS: 10.0000 mg | ORAL_TABLET | Freq: Every day | ORAL | Status: DC

## 2015-08-15 NOTE — Assessment & Plan Note (Signed)
At goal. Continue current medication. Check CMP today.

## 2015-08-15 NOTE — Assessment & Plan Note (Signed)
Patient with chronic low back pain with history of several lumbar surgeries. She notes positional numbness in her feet that resolves with change in position though no other neurological symptoms. Neurologically intact today. No red flags. We'll refill her Flexeril. She'll continue to monitor. Given return precautions.

## 2015-08-15 NOTE — Patient Instructions (Signed)
Nice to see you. I refilled your Flexeril. We will check liver function tests and your cholesterol today. If you develop worsening depression please let us know. If you develop numbness, weakness, loss of bowel or bladder function, numbness between her legs, thoughts of harming your self or others, or any new or changing symptoms please seek medical attention.

## 2015-08-15 NOTE — Assessment & Plan Note (Signed)
Well-controlled on Paxil. No SI. Continue Paxil.

## 2015-08-15 NOTE — Progress Notes (Signed)
Pre visit review using our clinic review tool, if applicable. No additional management support is needed unless otherwise documented below in the visit note. 

## 2015-08-15 NOTE — Progress Notes (Signed)
Patient ID: MYRKA SYLVA, female   DOB: 04-10-1950, 65 y.o.   MRN: [id number]  Tommi Rumps, MD Phone: [phone]  Anne [name] is a 65 y.o. female who presents today for f/u.  HYPERTENSION Disease Monitoring: Blood pressure range-130s over 70s Chest pain- no      Dyspnea- no Medications: Compliance- taking triamterene HCTZ   Edema- no  HYPERLIPIDEMIA Disease Monitoring: See symptoms for Hypertension Medications: Compliance- taking lovastatin Right upper quadrant pain- no  Muscle aches- no Does not exercise. Does not eat poorly though does not eat well. Lunch is typically a sandwich. Dinner typically consists of a meat and some vegetables. Hotdogs last night.  Depression: Patient notes this is stable. Has been on Paxil for 10-15 years. No SI. Did have a suicide attempt about 20 years ago. Depression screen PHQ 2/9 08/15/2015  Decreased Interest 1  Down, Depressed, Hopeless 0  PHQ - 2 Score 1  Altered sleeping 1  Tired, decreased energy 1  Change in appetite 1  Feeling bad or failure about yourself  0  Trouble concentrating 0  Moving slowly or fidgety/restless 0  Suicidal thoughts 0  PHQ-9 Score 4  Difficult doing work/chores Somewhat difficult    Low back pain: Patient notes this is a chronic issue. Had lumbar surgery twice for degenerative disc disease. Notes no pain at this time though does have this somewhat frequently. Depending on how she sits she can get some numbness in her feet though no other numbness. Notes this resolves with change in position. No weakness. No loss of bowel or bladder function, saddle anesthesia, fevers, or history of cancer. Taking Flexeril for this with good benefit at night.   PMH: Former smoker   ROS see history of present illness  Objective  Physical Exam Filed Vitals:   08/15/15 1024  BP: 128/76  Pulse: 85  Temp: 98.1 F (36.7 C)    BP Readings from Last 3 Encounters:  08/15/15 128/76  06/14/15 130/80  04/11/15 116/70     Wt Readings from Last 3 Encounters:  08/15/15 214 lb 12.8 oz (97.433 kg)  06/14/15 214 lb (97.07 kg)  04/11/15 211 lb 6.4 oz (95.89 kg)    Physical Exam  Constitutional: No distress.  HENT:  Head: Normocephalic and atraumatic.  Cardiovascular: Normal rate, regular rhythm and normal heart sounds.   Pulmonary/Chest: Effort normal and breath sounds normal.  Musculoskeletal: She exhibits no edema.  Neurological: She is alert. Gait normal.  5 out of 5 strength bilateral quads, hamstrings, plantar flexion, and dorsiflexion, sensation to light touch intact in bilateral lower extremities  Skin: Skin is warm and dry. She is not diaphoretic.  Psychiatric:  Mood depressed, affect normal     Assessment/Plan: Please see individual problem list.  Benign essential HTN At goal. Continue current medication. Check CMP today.  HLD (hyperlipidemia) Tolerating lovastatin. Check direct LDL and CMP today. Discussed diet and exercise.  Depression Well-controlled on Paxil. No SI. Continue Paxil.  Chronic low back pain Patient with chronic low back pain with history of several lumbar surgeries. She notes positional numbness in her feet that resolves with change in position though no other neurological symptoms. Neurologically intact today. No red flags. We'll refill her Flexeril. She'll continue to monitor. Given return precautions.    Orders Placed This Encounter  Procedures  . Comp Met (CMET)  . Direct LDL    Meds ordered this encounter  Medications  . cyclobenzaprine (FLEXERIL) 10 MG tablet    Sig: Take 1  tablet (10 mg total) by mouth at bedtime.    Dispense:  30 tablet    Refill:  3    , MD [facility] [place]   

## 2015-08-15 NOTE — Assessment & Plan Note (Signed)
Tolerating lovastatin. Check direct LDL and CMP today. Discussed diet and exercise.

## 2015-08-17 ENCOUNTER — Telehealth: Payer: Self-pay | Admitting: *Deleted

## 2015-08-17 NOTE — Telephone Encounter (Signed)
Spoke with patient, see result note with details. thanks

## 2015-08-17 NOTE — Telephone Encounter (Signed)
Patient requested lab results from 08/15/15 Pt contact [phone]

## 2015-08-19 ENCOUNTER — Other Ambulatory Visit: Payer: Self-pay | Admitting: Family Medicine

## 2015-08-19 ENCOUNTER — Encounter: Payer: Self-pay | Admitting: Family Medicine

## 2015-08-19 MED ORDER — LOVASTATIN 40 MG PO TABS: 80.0000 mg | ORAL_TABLET | Freq: Every day | ORAL | Status: DC

## 2015-10-07 ENCOUNTER — Encounter: Payer: Self-pay | Admitting: Family Medicine

## 2015-10-07 ENCOUNTER — Ambulatory Visit (INDEPENDENT_AMBULATORY_CARE_PROVIDER_SITE_OTHER): Payer: PPO | Admitting: Family Medicine

## 2015-10-07 ENCOUNTER — Ambulatory Visit: Payer: PPO | Admitting: Family Medicine

## 2015-10-07 VITALS — BP 138/80 | HR 82 | Temp 97.8°F | Resp 14 | Ht 65.0 in | Wt 215.2 lb

## 2015-10-07 DIAGNOSIS — R1110 Vomiting, unspecified: Secondary | ICD-10-CM | POA: Diagnosis not present

## 2015-10-07 DIAGNOSIS — R197 Diarrhea, unspecified: Secondary | ICD-10-CM | POA: Diagnosis not present

## 2015-10-07 LAB — BASIC METABOLIC PANEL
BUN: 9 mg/dL (ref 6–23)
CALCIUM: 9.5 mg/dL (ref 8.4–10.5)
CO2: 31 mEq/L (ref 19–32)
Chloride: 100 mEq/L (ref 96–112)
Creatinine, Ser: 0.86 mg/dL (ref 0.40–1.20)
GFR: 69.84 mL/min (ref >60.00)
Glucose, Bld: 102 mg/dL — ABNORMAL HIGH (ref 70–99)
Potassium: 3.6 mEq/L (ref 3.5–5.1)
SODIUM: 138 meq/L (ref 135–145)

## 2015-10-07 NOTE — Progress Notes (Signed)
Pre visit review using our clinic review tool, if applicable. No additional management support is needed unless otherwise documented below in the visit note. 

## 2015-10-07 NOTE — Patient Instructions (Signed)
Nice to see you. Your symptoms are likely related to a viral illness. We will check your kidney function and electrolytes to ensure that you have remained hydrated and maintained your electrolytes. If your symptoms do not continue to improve next week let us know we will obtain stool studies. If you develop blood in your vomit, blood in your diarrhea, abdominal pain, or fevers please seek medical attention.

## 2015-10-07 NOTE — Assessment & Plan Note (Signed)
Suspect viral illness as cause. Appears to be improving at this time. We will check a BMP given duration of vomiting and diarrhea. She'll continue to stay well hydrated. Discussed hydration strategies. If does not continue to improve with her diarrhea we will check stool studies. Given return precautions.

## 2015-10-07 NOTE — Progress Notes (Signed)
  Tommi Rumps, MD Phone: [phone]  Anne [name] is a 65 y.o. female who presents today for same-day visit.  Patient notes over the last 2 weeks she's had intermittent vomiting and diarrhea. Notes last vomited on Wednesday. Last had diarrhea this morning. Notes her stool is getting more solid. No blood in her vomit or stool. No abdominal pain. No fevers. No new foods. No sick contacts. Drinks city water. Has been trying to stay well hydrated and eats crackers.   ROS see history of present illness  Objective  Physical Exam Vitals:   10/07/15 1425  BP: 138/80  Pulse: 82  Resp: 14  Temp: 97.8 F (36.6 C)    BP Readings from Last 3 Encounters:  10/07/15 138/80  08/15/15 128/76  06/14/15 130/80   Wt Readings from Last 3 Encounters:  10/07/15 215 lb 3.2 oz (97.6 kg)  08/15/15 214 lb 12.8 oz (97.4 kg)  06/14/15 214 lb (97.1 kg)    Physical Exam  Constitutional: No distress.  HENT:  Mouth/Throat: Oropharynx is clear and moist.  Moist mucous membranes  Cardiovascular: Normal rate, regular rhythm and normal heart sounds.   Pulmonary/Chest: Effort normal and breath sounds normal.  Abdominal: Soft. Bowel sounds are normal. She exhibits no distension. There is no tenderness. There is no rebound and no guarding.  Neurological: She is alert. Gait normal.  Skin: Skin is warm and dry. She is not diaphoretic.     Assessment/Plan: Please see individual problem list.  Vomiting and diarrhea Suspect viral illness as cause. Appears to be improving at this time. We will check a BMP given duration of vomiting and diarrhea. She'll continue to stay well hydrated. Discussed hydration strategies. If does not continue to improve with her diarrhea we will check stool studies. Given return precautions.   Orders Placed This Encounter  Procedures  . Basic Metabolic Panel (BMET)    Tommi Rumps, MD [place]

## 2015-10-11 ENCOUNTER — Other Ambulatory Visit: Payer: Self-pay | Admitting: Nurse Practitioner

## 2015-11-18 ENCOUNTER — Ambulatory Visit (INDEPENDENT_AMBULATORY_CARE_PROVIDER_SITE_OTHER): Payer: PPO | Admitting: Family Medicine

## 2015-11-18 ENCOUNTER — Encounter: Payer: Self-pay | Admitting: Family Medicine

## 2015-11-18 DIAGNOSIS — I10 Essential (primary) hypertension: Secondary | ICD-10-CM | POA: Diagnosis not present

## 2015-11-18 DIAGNOSIS — M25552 Pain in left hip: Secondary | ICD-10-CM | POA: Insufficient documentation

## 2015-11-18 DIAGNOSIS — E785 Hyperlipidemia, unspecified: Secondary | ICD-10-CM

## 2015-11-18 NOTE — Assessment & Plan Note (Signed)
I suspect discomfort is related to bursitis though could be related to a tendinitis as well. No injury. Benign exam today. She was advised on ibuprofen use. Advised on stretching for this. Ice as well. She'll call if it does not improve.

## 2015-11-18 NOTE — Assessment & Plan Note (Signed)
Tolerating medication. Continue lovastatin.

## 2015-11-18 NOTE — Patient Instructions (Signed)
Nice to see you. Please continue your lovastatin and triamterene HCTZ. You likely have irritation of the bursa or tendons that insert to your issue him. You can use ibuprofen 600 mg every 8 hours to help with this. You can also do the stretches that we talked about. You could also by a doughnut cushion see if that would be beneficial.

## 2015-11-18 NOTE — Progress Notes (Signed)
  Tommi Rumps, MD Phone: [phone]  Anne [name] is a 65 y.o. female who presents today for follow-up.  HYPERTENSION Disease Monitoring: Blood pressure range-typically less than 130/90 Chest pain, palpitations- no      Dyspnea- no Medications: Compliance- taking triamterene HCTZ   Edema- no  HYPERLIPIDEMIA Disease Monitoring: See symptoms for Hypertension Medications: Compliance- taking lovastatin     Right upper quadrant pain- no  Muscle aches- no Continues to work on diet and exercise. Is down 4 pounds since her last visit.  Left upper hamstring discomfort: Patient notes his been going on for 3 months. Notes it feels like it just below her buttocks. No she cannot sit for a long time before it starts to hurt. Nothing else makes it hurt. No radiation. No numbness or weakness.   PMH: former smoker   ROS see history of present illness  Objective  Physical Exam Vitals:   11/18/15 0900  BP: 120/76  Pulse: 90  Temp: 98.3 F (36.8 C)    BP Readings from Last 3 Encounters:  11/18/15 120/76  10/07/15 138/80  08/15/15 128/76   Wt Readings from Last 3 Encounters:  11/18/15 211 lb 6.4 oz (95.9 kg)  10/07/15 215 lb 3.2 oz (97.6 kg)  08/15/15 214 lb 12.8 oz (97.4 kg)    Physical Exam  Constitutional: She is well-developed, well-nourished, and in no distress.  Cardiovascular: Normal rate, regular rhythm and normal heart sounds.   Pulmonary/Chest: Effort normal and breath sounds normal.  Musculoskeletal: She exhibits no edema.  Bilateral buttocks and ischium with no tenderness, swelling, or erythema, normal internal and external range of motion bilateral hips with no pain  Neurological: She is alert. Gait normal.     Assessment/Plan: Please see individual problem list.  Benign essential HTN At goal. Continue current medication.  HLD (hyperlipidemia) Tolerating medication. Continue lovastatin.  Ischial pain, left I suspect discomfort is related to bursitis  though could be related to a tendinitis as well. No injury. Benign exam today. She was advised on ibuprofen use. Advised on stretching for this. Ice as well. She'll call if it does not improve.   Tommi Rumps, MD [place]

## 2015-11-18 NOTE — Progress Notes (Signed)
Pre visit review using our clinic review tool, if applicable. No additional management support is needed unless otherwise documented below in the visit note. 

## 2015-11-18 NOTE — Assessment & Plan Note (Signed)
At goal. Continue current medication. 

## 2015-11-25 ENCOUNTER — Inpatient Hospital Stay
Admission: EM | Admit: 2015-11-25 | Discharge: 2015-11-27 | DRG: 192 | Disposition: A | Discharge status: Home / Self Care | Payer: PPO | Attending: Internal Medicine | Admitting: Internal Medicine

## 2015-11-25 ENCOUNTER — Emergency Department: Payer: PPO

## 2015-11-25 DIAGNOSIS — F329 Major depressive disorder, single episode, unspecified: Secondary | ICD-10-CM | POA: Diagnosis present

## 2015-11-25 DIAGNOSIS — M25552 Pain in left hip: Secondary | ICD-10-CM | POA: Diagnosis present

## 2015-11-25 DIAGNOSIS — E785 Hyperlipidemia, unspecified: Secondary | ICD-10-CM | POA: Diagnosis present

## 2015-11-25 DIAGNOSIS — J40 Bronchitis, not specified as acute or chronic: Secondary | ICD-10-CM | POA: Diagnosis present

## 2015-11-25 DIAGNOSIS — Z8249 Family history of ischemic heart disease and other diseases of the circulatory system: Secondary | ICD-10-CM

## 2015-11-25 DIAGNOSIS — J441 Chronic obstructive pulmonary disease with (acute) exacerbation: Principal | ICD-10-CM | POA: Diagnosis present

## 2015-11-25 DIAGNOSIS — F32A Depression, unspecified: Secondary | ICD-10-CM | POA: Diagnosis present

## 2015-11-25 DIAGNOSIS — Z87891 Personal history of nicotine dependence: Secondary | ICD-10-CM | POA: Diagnosis not present

## 2015-11-25 DIAGNOSIS — E876 Hypokalemia: Secondary | ICD-10-CM | POA: Diagnosis present

## 2015-11-25 DIAGNOSIS — I10 Essential (primary) hypertension: Secondary | ICD-10-CM | POA: Diagnosis not present

## 2015-11-25 DIAGNOSIS — R05 Cough: Secondary | ICD-10-CM | POA: Diagnosis not present

## 2015-11-25 DIAGNOSIS — Z79899 Other long term (current) drug therapy: Secondary | ICD-10-CM

## 2015-11-25 DIAGNOSIS — J9601 Acute respiratory failure with hypoxia: Secondary | ICD-10-CM | POA: Diagnosis not present

## 2015-11-25 DIAGNOSIS — R0602 Shortness of breath: Secondary | ICD-10-CM | POA: Diagnosis not present

## 2015-11-25 LAB — CBC WITH DIFFERENTIAL/PLATELET
BASOS PCT: 1 %
Basophils Absolute: 0.1 10*3/uL (ref 0–0.1)
EOS ABS: 0.6 10*3/uL (ref 0–0.7)
EOS PCT: 7 %
HCT: 40.3 % (ref 35.0–47.0)
HEMOGLOBIN: 13.4 g/dL (ref 12.0–16.0)
LYMPHS ABS: 1 10*3/uL (ref 1.0–3.6)
Lymphocytes Relative: 11 %
MCH: 28.5 pg (ref 26.0–34.0)
MCHC: 33.2 g/dL (ref 32.0–36.0)
MCV: 85.9 fL (ref 80.0–100.0)
Monocytes Absolute: 0.4 10*3/uL (ref 0.2–0.9)
Monocytes Relative: 5 %
NEUTROS PCT: 76 %
Neutro Abs: 6.7 10*3/uL — ABNORMAL HIGH (ref 1.4–6.5)
PLATELETS: 158 10*3/uL (ref 150–440)
RBC: 4.7 MIL/uL (ref 3.80–5.20)
RDW: 15.9 % — ABNORMAL HIGH (ref 11.5–14.5)
WBC: 8.9 10*3/uL (ref 3.6–11.0)

## 2015-11-25 LAB — BASIC METABOLIC PANEL
Anion gap: 10 (ref 5–15)
BUN: 12 mg/dL (ref 6–20)
CHLORIDE: 100 mmol/L — AB (ref 101–111)
CO2: 31 mmol/L (ref 22–32)
CREATININE: 1.16 mg/dL — AB (ref 0.44–1.00)
Calcium: 9.2 mg/dL (ref 8.9–10.3)
GFR calc Af Amer: 55 mL/min — ABNORMAL LOW (ref >60)
GFR, EST NON AFRICAN AMERICAN: 48 mL/min — AB (ref >60)
Glucose, Bld: 142 mg/dL — ABNORMAL HIGH (ref 65–99)
POTASSIUM: 2.8 mmol/L — AB (ref 3.5–5.1)
SODIUM: 141 mmol/L (ref 135–145)

## 2015-11-25 LAB — HEPATIC FUNCTION PANEL
ALBUMIN: 3.9 g/dL (ref 3.5–5.0)
ALT: 22 U/L (ref 14–54)
AST: 30 U/L (ref 15–41)
Alkaline Phosphatase: 153 U/L — ABNORMAL HIGH (ref 38–126)
Bilirubin, Direct: <0.1 mg/dL — ABNORMAL LOW (ref 0.1–0.5)
TOTAL PROTEIN: 8.6 g/dL — AB (ref 6.5–8.1)
Total Bilirubin: 0.9 mg/dL (ref 0.3–1.2)

## 2015-11-25 LAB — LACTIC ACID, PLASMA
LACTIC ACID, VENOUS: 2.5 mmol/L — AB (ref 0.5–1.9)
LACTIC ACID, VENOUS: 3.1 mmol/L — AB (ref 0.5–1.9)

## 2015-11-25 LAB — MAGNESIUM: Magnesium: 1.7 mg/dL (ref 1.7–2.4)

## 2015-11-25 LAB — TROPONIN I

## 2015-11-25 MED ORDER — SODIUM CHLORIDE 0.9 % IV BOLUS (SEPSIS)
1000.0000 mL | Freq: Once | INTRAVENOUS | Status: AC
  Administered 2015-11-25: 1000 mL via INTRAVENOUS

## 2015-11-25 MED ORDER — CEFTRIAXONE SODIUM-DEXTROSE 1-3.74 GM-% IV SOLR
1.0000 g | INTRAVENOUS | Status: DC
  Filled 2015-11-25 (×3): qty 50

## 2015-11-25 MED ORDER — DEXTROSE 5 % IV SOLN
500.0000 mg | INTRAVENOUS | Status: DC
  Filled 2015-11-25: qty 500

## 2015-11-25 MED ORDER — METHYLPREDNISOLONE SODIUM SUCC 125 MG IJ SOLR
60.0000 mg | Freq: Four times a day (QID) | INTRAMUSCULAR | Status: DC
  Administered 2015-11-26 (×2): 60 mg via INTRAVENOUS
  Filled 2015-11-25 (×2): qty 2

## 2015-11-25 MED ORDER — PRAVASTATIN SODIUM 40 MG PO TABS
40.0000 mg | ORAL_TABLET | Freq: Every day | ORAL | Status: DC
  Administered 2015-11-26: 40 mg via ORAL
  Filled 2015-11-25: qty 1

## 2015-11-25 MED ORDER — ONDANSETRON HCL 4 MG PO TABS: 4.0000 mg | ORAL_TABLET | Freq: Four times a day (QID) | ORAL | Status: DC | PRN

## 2015-11-25 MED ORDER — GUAIFENESIN-DM 100-10 MG/5ML PO SYRP
5.0000 mL | ORAL_SOLUTION | ORAL | Status: DC | PRN
  Administered 2015-11-26: 5 mL via ORAL
  Filled 2015-11-25: qty 5

## 2015-11-25 MED ORDER — MAGNESIUM SULFATE 2 GM/50ML IV SOLN
2.0000 g | Freq: Once | INTRAVENOUS | Status: AC
  Administered 2015-11-25: 2 g via INTRAVENOUS
  Filled 2015-11-25: qty 50

## 2015-11-25 MED ORDER — SODIUM CHLORIDE 0.9 % IV SOLN
INTRAVENOUS | Status: DC
  Administered 2015-11-25: 22:00:00 via INTRAVENOUS

## 2015-11-25 MED ORDER — DEXTROSE 5 % IV SOLN
500.0000 mg | Freq: Once | INTRAVENOUS | Status: AC
  Administered 2015-11-25: 500 mg via INTRAVENOUS
  Filled 2015-11-25: qty 500

## 2015-11-25 MED ORDER — SODIUM CHLORIDE 0.9 % IV BOLUS (SEPSIS)
1000.0000 mL | INTRAVENOUS | Status: DC | PRN
  Administered 2015-11-25: 1000 mL via INTRAVENOUS
  Filled 2015-11-25: qty 1000

## 2015-11-25 MED ORDER — POTASSIUM CHLORIDE CRYS ER 20 MEQ PO TBCR
40.0000 meq | EXTENDED_RELEASE_TABLET | Freq: Once | ORAL | Status: AC
Start: 2015-11-25 — End: 2015-11-25
  Administered 2015-11-25: 40 meq via ORAL
  Filled 2015-11-25: qty 2

## 2015-11-25 MED ORDER — ENOXAPARIN SODIUM 40 MG/0.4ML ~~LOC~~ SOLN
40.0000 mg | SUBCUTANEOUS | Status: DC
  Administered 2015-11-25 – 2015-11-26 (×2): 40 mg via SUBCUTANEOUS
  Filled 2015-11-25 (×2): qty 0.4

## 2015-11-25 MED ORDER — ALBUTEROL SULFATE (2.5 MG/3ML) 0.083% IN NEBU
5.0000 mg | INHALATION_SOLUTION | Freq: Once | RESPIRATORY_TRACT | Status: AC
  Administered 2015-11-25: 5 mg via RESPIRATORY_TRACT
  Filled 2015-11-25: qty 6

## 2015-11-25 MED ORDER — PAROXETINE HCL 20 MG PO TABS
20.0000 mg | ORAL_TABLET | Freq: Every day | ORAL | Status: DC
Start: 2015-11-25 — End: 2015-11-28
  Administered 2015-11-26 – 2015-11-27 (×2): 20 mg via ORAL
  Filled 2015-11-25 (×2): qty 1

## 2015-11-25 MED ORDER — METHYLPREDNISOLONE SODIUM SUCC 125 MG IJ SOLR
125.0000 mg | Freq: Once | INTRAMUSCULAR | Status: AC
  Administered 2015-11-25: 125 mg via INTRAVENOUS
  Filled 2015-11-25: qty 2

## 2015-11-25 MED ORDER — DEXTROSE 5 % IV SOLN: 1.0000 g | Freq: Once | INTRAVENOUS | Status: DC

## 2015-11-25 MED ORDER — ACETAMINOPHEN 325 MG PO TABS: 650.0000 mg | ORAL_TABLET | Freq: Four times a day (QID) | ORAL | Status: DC | PRN

## 2015-11-25 MED ORDER — SODIUM CHLORIDE 0.9% FLUSH
3.0000 mL | Freq: Two times a day (BID) | INTRAVENOUS | Status: DC
  Administered 2015-11-26: 3 mL via INTRAVENOUS

## 2015-11-25 MED ORDER — ACETAMINOPHEN 650 MG RE SUPP: 650.0000 mg | Freq: Four times a day (QID) | RECTAL | Status: DC | PRN

## 2015-11-25 MED ORDER — IPRATROPIUM-ALBUTEROL 0.5-2.5 (3) MG/3ML IN SOLN: 3.0000 mL | RESPIRATORY_TRACT | Status: DC | PRN

## 2015-11-25 MED ORDER — IPRATROPIUM-ALBUTEROL 0.5-2.5 (3) MG/3ML IN SOLN
3.0000 mL | Freq: Once | RESPIRATORY_TRACT | Status: AC
  Administered 2015-11-25: 3 mL via RESPIRATORY_TRACT
  Filled 2015-11-25: qty 3

## 2015-11-25 MED ORDER — ONDANSETRON HCL 4 MG/2ML IJ SOLN: 4.0000 mg | Freq: Four times a day (QID) | INTRAMUSCULAR | Status: DC | PRN

## 2015-11-25 NOTE — ED Provider Notes (Signed)
[facility] Emergency Department Provider Note    First MD Initiated Contact with Patient 11/25/15 1903     (approximate)  I have reviewed the triage vital signs and the nursing notes.   HISTORY  Chief Complaint Shortness of Breath    HPI Anne [name] is a 65 y.o. female with several days of worsening shortness of breath and cough. States that she first started feeling short of breath over the weekend but this is progress particularly the last 2 days. She is a extensive user of the cigarettes. Has never been diagnosed as COPD or asthma. Does not wear home oxygen. Denies any prolonged travel. No orthopnea. Denies any chest pain. Denies any pleuritic pain. No nausea. No jaw pain. No diaphoresis. No recent fevers or chills. Had sudden worsening shortness of breath today and was speaking in short phrases. Called EMS. EMS evaluated her and found her in moderate respiratory distress. They gave her an albuterol nebulizer with improvement in her symptoms. Patient arrives to the ER with acute hypoxic respiratory failure but again denies any chest pain or pressure.  States that she feels that there is phlegm caught in the back or throat.   Past Medical History:  Diagnosis Date  . Arthritis   . Depression   . Hyperlipidemia   . Hypertension     Patient Active Problem List   Diagnosis Date Noted  . Ischial pain, left 11/18/2015  . Chronic low back pain 08/15/2015  . Right shoulder pain 06/14/2015  . Benign essential HTN 02/22/2015  . HLD (hyperlipidemia) 02/22/2015  . Depression 02/22/2015    Past Surgical History:  Procedure Laterality Date  . APPENDECTOMY  1971  . CHOLECYSTECTOMY  1971  . TONSILECTOMY, ADENOIDECTOMY, BILATERAL MYRINGOTOMY AND TUBES  1955    Prior to Admission medications   Medication Sig Start Date End Date Taking? Authorizing Provider  Cholecalciferol (VITAMIN D3) 1000 units CAPS Take 1 capsule by mouth daily.    Historical Provider,  MD  cyclobenzaprine (FLEXERIL) 10 MG tablet Take 1 tablet (10 mg total) by mouth at bedtime. 08/15/15   Leone Haven, MD  DHA-Vitamin C-Lutein (EYE HEALTH FORMULA PO) Take by mouth.    Historical Provider, MD  Fish Oil-Cholecalciferol (FISH OIL + D3) 1000-1000 MG-UNIT CAPS Take by mouth.    Historical Provider, MD  lovastatin (MEVACOR) 40 MG tablet Take 2 tablets (80 mg total) by mouth at bedtime. 08/19/15   Leone Haven, MD  meloxicam (MOBIC) 15 MG tablet Take 1 tablet (15 mg total) by mouth daily. 04/21/15   Rubbie Battiest, NP  PARoxetine (PAXIL) 20 MG tablet TAKE ONE TABLET BY MOUTH ONCE DAILY 10/11/15   Leone Haven, MD  [facility] Wort 150 MG CAPS Take 1 capsule by mouth daily.    Historical Provider, MD  triamterene-hydrochlorothiazide (MAXZIDE-25) 37.5-25 MG tablet TAKE ONE TABLET BY MOUTH ONCE DAILY 06/15/15   Rubbie Battiest, NP    Allergies Review of patient's allergies indicates no known allergies.  Family History  Problem Relation Age of Onset  . Cancer Mother     ovarian  . Hypertension Mother   . Ovarian cancer Mother 49  . Heart disease Father   . Stroke Father   . Ovarian cancer Sister     ? dx cancer had hyst.  . Breast cancer Neg Hx     Social History Social History  Substance Use Topics  . Smoking status: Former Smoker    Quit date:  02/07/2012  . Smokeless tobacco: Never Used  . Alcohol use 0.0 oz/week    Review of Systems Patient denies headaches, rhinorrhea, blurry vision, numbness, shortness of breath, chest pain, edema, cough, abdominal pain, nausea, vomiting, diarrhea, dysuria, fevers, rashes or hallucinations unless otherwise stated above in HPI. ____________________________________________   PHYSICAL EXAM:  VITAL SIGNS: Vitals:   11/25/15 1901  BP: 140/78  Pulse: (!) 112  Temp: 97.7 F (36.5 C)    Constitutional: Alert and oriented. Ill appearing in moderate respiratory distress Eyes: Conjunctivae are normal. PERRL. EOMI. Head:  Atraumatic. Nose: No congestion/rhinnorhea. Mouth/Throat: Mucous membranes are moist.  Oropharynx non-erythematous. Neck: No stridor. Painless ROM. No cervical spine tenderness to palpation Hematological/Lymphatic/Immunilogical: No cervical lymphadenopathy. Cardiovascular: tachycardic, regular rhythm. Grossly normal heart sounds.  Good peripheral circulation. Respiratory: tachypnea, moderate respiratory distress.  No retractions. Lungs CTAB. Gastrointestinal: Soft and nontender. No distention. No abdominal bruits. No CVA tenderness. Musculoskeletal: No lower extremity tenderness nor edema.  No joint effusions. Neurologic:  Normal speech and language. No gross focal neurologic deficits are appreciated. No gait instability. Skin:  Skin is warm, dry and intact. No rash noted. Psychiatric: Mood and affect are normal. Speech and behavior are normal.  ____________________________________________   LABS (all labs ordered are listed, but only abnormal results are displayed)  Results for orders placed or performed during the hospital encounter of 11/25/15 (from the past 24 hour(s))  CBC with Differential/Platelet     Status: Abnormal   Collection Time: 11/25/15  7:05 PM  Result Value Ref Range   WBC 8.9 3.6 - 11.0 K/uL   RBC 4.70 3.80 - 5.20 MIL/uL   Hemoglobin 13.4 12.0 - 16.0 g/dL   HCT 40.3 35.0 - 47.0 %   MCV 85.9 80.0 - 100.0 fL   MCH 28.5 26.0 - 34.0 pg   MCHC 33.2 32.0 - 36.0 g/dL   RDW 15.9 (H) 11.5 - 14.5 %   Platelets 158 150 - 440 K/uL   Neutrophils Relative % 76 %   Neutro Abs 6.7 (H) 1.4 - 6.5 K/uL   Lymphocytes Relative 11 %   Lymphs Abs 1.0 1.0 - 3.6 K/uL   Monocytes Relative 5 %   Monocytes Absolute 0.4 0.2 - 0.9 K/uL   Eosinophils Relative 7 %   Eosinophils Absolute 0.6 0 - 0.7 K/uL   Basophils Relative 1 %   Basophils Absolute 0.1 0 - 0.1 K/uL  Basic metabolic panel     Status: Abnormal   Collection Time: 11/25/15  7:05 PM  Result Value Ref Range   Sodium 141 135  - 145 mmol/L   Potassium 2.8 (L) 3.5 - 5.1 mmol/L   Chloride 100 (L) 101 - 111 mmol/L   CO2 31 22 - 32 mmol/L   Glucose, Bld 142 (H) 65 - 99 mg/dL   BUN 12 6 - 20 mg/dL   Creatinine, Ser 1.16 (H) 0.44 - 1.00 mg/dL   Calcium 9.2 8.9 - 10.3 mg/dL   GFR calc non Af Amer 48 (L) >60 mL/min   GFR calc Af Amer 55 (L) >60 mL/min   Anion gap 10 5 - 15  Hepatic function panel     Status: Abnormal   Collection Time: 11/25/15  7:05 PM  Result Value Ref Range   Total Protein 8.6 (H) 6.5 - 8.1 g/dL   Albumin 3.9 3.5 - 5.0 g/dL   AST 30 15 - 41 U/L   ALT 22 14 - 54 U/L   Alkaline Phosphatase 153 (H) 38 -  126 U/L   Total Bilirubin 0.9 0.3 - 1.2 mg/dL   Bilirubin, Direct <0.1 (L) 0.1 - 0.5 mg/dL   Indirect Bilirubin NOT CALCULATED 0.3 - 0.9 mg/dL  Troponin I     Status: None   Collection Time: 11/25/15  7:05 PM  Result Value Ref Range   Troponin I <0.03 <0.03 ng/mL  Magnesium     Status: None   Collection Time: 11/25/15  7:05 PM  Result Value Ref Range   Magnesium 1.7 1.7 - 2.4 mg/dL  Lactic acid, plasma     Status: Abnormal   Collection Time: 11/25/15  7:06 PM  Result Value Ref Range   Lactic Acid, Venous 2.5 (HH) 0.5 - 1.9 mmol/L   ____________________________________________  EKG My review and personal interpretation at Time: 19:03   Indication: sob  Rate: 110  Rhythm: sinus Axis: noraml Other: nonspecific ST changes diffusely, <31m elevation in AVR ____________________________________________  RADIOLOGY  I personally reviewed all radiographic images ordered to evaluate for the above acute complaints and reviewed radiology reports and findings.  These findings were personally discussed with the patient.  Please see medical record for radiology report.  ____________________________________________   PROCEDURES  Procedure(s) performed: none    Critical Care performed: no ____________________________________________   INITIAL IMPRESSION / ASSESSMENT AND PLAN / ED  COURSE  Pertinent labs & imaging results that were available during my care of the patient were reviewed by me and considered in my medical decision making (see chart for details).  DDX: Asthma, copd, CHF, pna, ptx, malignancy, Pe, anemia   Anne [name] is a 65y.o. who presents to the ED with acute shortness of breath with evidence of acute respiratory failure with hypoxia. Patient is afebrile with mild tachycardia after albuterol treatment. Denies any chest pain or pressure. Does have EKG changes she has no chest pain and given her tachycardia EKG changes likely secondary to heart rate. Troponin is negative. Exam findings most consistent with COPD versus acute bronchitis. Patient treated with multiple nebulizer treatments, IV Solu-Medrol as well as IV magnesium. Patient has a mildly elevated lactic acid to 2.5. Given her productive cough, left shift and severe hypoxia will start a shunt on CA pneumonia treatment.  As patient still with O2 requirement and marked tachypnea patient will require admission for further evaluation management.  Have discussed with the patient and available family all diagnostics and treatments performed thus far and all questions were answered to the best of my ability. The patient demonstrates understanding and agreement with plan.   Clinical Course     ____________________________________________   FINAL CLINICAL IMPRESSION(S) / ED DIAGNOSES  Final diagnoses:  Acute respiratory failure with hypoxia ([place]      NEW MEDICATIONS STARTED DURING THIS VISIT:  New Prescriptions   No medications on file     Note:  This document was prepared using Dragon voice recognition software and may include unintentional dictation errors.    PMerlyn Lot MD 11/26/15 0005

## 2015-11-25 NOTE — H&P (Signed)
[facility] [place] NAME: Anne [name]    MR#:  [id number]  DATE OF BIRTH:  Jul 22, 1950  DATE OF ADMISSION:  11/25/2015  PRIMARY CARE PHYSICIAN: Tommi Rumps, MD   REQUESTING/REFERRING PHYSICIAN: Quentin Cornwall, MD  CHIEF COMPLAINT:   Chief Complaint  Patient presents with  . Shortness of Breath    HISTORY OF PRESENT ILLNESS:  Anne [name]  is a 65 y.o. female who presents with 24 hours of progressive shortness of breath. She states that she woke up "not feeling well." She began having progressive shortness of breath for the day, and came in very short of breath and mildly hypoxic to the ED. Chest x-ray shows bronchitis. She has no formal diagnosis of COPD, but has an extensive smoking history. COPD exacerbation treatment started in the ED, hospitals were called for admission.  PAST MEDICAL HISTORY:   Past Medical History:  Diagnosis Date  . Arthritis   . Depression   . Hyperlipidemia   . Hypertension     PAST SURGICAL HISTORY:   Past Surgical History:  Procedure Laterality Date  . APPENDECTOMY  1971  . CHOLECYSTECTOMY  1971  . TONSILECTOMY, ADENOIDECTOMY, BILATERAL MYRINGOTOMY AND TUBES  1955    SOCIAL HISTORY:   Social History  Substance Use Topics  . Smoking status: Former Smoker    Quit date: 02/07/2012  . Smokeless tobacco: Never Used  . Alcohol use 0.0 oz/week    FAMILY HISTORY:   Family History  Problem Relation Age of Onset  . Cancer Mother     ovarian  . Hypertension Mother   . Ovarian cancer Mother 12  . Heart disease Father   . Stroke Father   . Ovarian cancer Sister     ? dx cancer had hyst.  . Breast cancer Neg Hx     DRUG ALLERGIES:  No Known Allergies  MEDICATIONS AT HOME:   Prior to Admission medications   Medication Sig Start Date End Date Taking? Authorizing Provider  Cholecalciferol (VITAMIN D3) 1000 units CAPS Take 1 capsule by mouth daily.   Yes Historical Provider, MD   cyclobenzaprine (FLEXERIL) 10 MG tablet Take 1 tablet (10 mg total) by mouth at bedtime. 08/15/15  Yes Leone Haven, MD  DHA-Vitamin C-Lutein (EYE HEALTH FORMULA PO) Take by mouth.   Yes Historical Provider, MD  Fish Oil-Cholecalciferol (FISH OIL + D3) 1000-1000 MG-UNIT CAPS Take by mouth.   Yes Historical Provider, MD  lovastatin (MEVACOR) 40 MG tablet Take 2 tablets (80 mg total) by mouth at bedtime. 08/19/15  Yes Leone Haven, MD  meloxicam (MOBIC) 15 MG tablet Take 1 tablet (15 mg total) by mouth daily. 04/21/15  Yes Rubbie Battiest, NP  PARoxetine (PAXIL) 20 MG tablet TAKE ONE TABLET BY MOUTH ONCE DAILY 10/11/15  Yes Leone Haven, MD  [facility] Wort 150 MG CAPS Take 1 capsule by mouth daily.   Yes Historical Provider, MD  triamterene-hydrochlorothiazide (MAXZIDE-25) 37.5-25 MG tablet TAKE ONE TABLET BY MOUTH ONCE DAILY 06/15/15  Yes Rubbie Battiest, NP    REVIEW OF SYSTEMS:  Review of Systems  Constitutional: Negative for chills, fever, malaise/fatigue and weight loss.  HENT: Negative for ear pain, hearing loss and tinnitus.   Eyes: Negative for blurred vision, double vision, pain and redness.  Respiratory: Positive for cough and shortness of breath. Negative for hemoptysis.   Cardiovascular: Negative for chest pain, palpitations, orthopnea and leg swelling.  Gastrointestinal: Negative for abdominal pain,  constipation, diarrhea, nausea and vomiting.  Genitourinary: Negative for dysuria, frequency and hematuria.  Musculoskeletal: Negative for back pain, joint pain and neck pain.  Skin:       No acne, rash, or lesions  Neurological: Negative for dizziness, tremors, focal weakness and weakness.  Endo/Heme/Allergies: Negative for polydipsia. Does not bruise/bleed easily.  Psychiatric/Behavioral: Negative for depression. The patient is not nervous/anxious and does not have insomnia.      VITAL SIGNS:   Vitals:   11/25/15 1900 11/25/15 1901  BP:  140/78  Pulse:  (!) 112  Temp:   97.7 F (36.5 C)  TempSrc:  Oral  SpO2:  90%  Weight: 95.3 kg (210 lb)   Height: '5\' 6"'$  (1.676 m)    Wt Readings from Last 3 Encounters:  11/25/15 95.3 kg (210 lb)  11/18/15 95.9 kg (211 lb 6.4 oz)  10/07/15 97.6 kg (215 lb 3.2 oz)    PHYSICAL EXAMINATION:  Physical Exam  Vitals reviewed. Constitutional: She is oriented to person, place, and time. She appears well-developed and well-nourished. No distress.  HENT:  Head: Normocephalic and atraumatic.  Mouth/Throat: Oropharynx is clear and moist.  Eyes: Conjunctivae and EOM are normal. Pupils are equal, round, and reactive to light. No scleral icterus.  Neck: Normal range of motion. Neck supple. No JVD present. No thyromegaly present.  Cardiovascular: Normal rate, regular rhythm and intact distal pulses.  Exam reveals no gallop and no friction rub.   No murmur heard. Respiratory: Effort normal. No respiratory distress. She has wheezes. She has no rales.  GI: Soft. Bowel sounds are normal. She exhibits no distension. There is no tenderness.  Musculoskeletal: Normal range of motion. She exhibits no edema.  No arthritis, no gout  Lymphadenopathy:    She has no cervical adenopathy.  Neurological: She is alert and oriented to person, place, and time. No cranial nerve deficit.  No dysarthria, no aphasia  Skin: Skin is warm and dry. No rash noted. No erythema.  Psychiatric: She has a normal mood and affect. Her behavior is normal. Judgment and thought content normal.    LABORATORY PANEL:   CBC  Recent Labs Lab 11/25/15 1905  WBC 8.9  HGB 13.4  HCT 40.3  PLT 158   ------------------------------------------------------------------------------------------------------------------  Chemistries   Recent Labs Lab 11/25/15 1905  NA 141  K 2.8*  CL 100*  CO2 31  GLUCOSE 142*  BUN 12  CREATININE 1.16*  CALCIUM 9.2  MG 1.7  AST 30  ALT 22  ALKPHOS 153*  BILITOT 0.9    ------------------------------------------------------------------------------------------------------------------  Cardiac Enzymes  Recent Labs Lab 11/25/15 1905  TROPONINI <0.03   ------------------------------------------------------------------------------------------------------------------  RADIOLOGY:  Dg Chest 2 View  Result Date: 11/25/2015 CLINICAL DATA:  65 year old female with history of shortness of breath for the past 2-3 days after cleaning out her garage. Cough. EXAM: CHEST  2 VIEW COMPARISON:  Chest x-ray 12/15/2009. FINDINGS: Diffuse peribronchial cuffing and mild diffuse interstitial prominence. Lung volumes are normal. No consolidative airspace disease. No pleural effusions. No pneumothorax. No pulmonary nodule or mass noted. Pulmonary vasculature and the cardiomediastinal silhouette are within normal limits. Aortic atherosclerosis. IMPRESSION: 1. Diffuse peribronchial cuffing and interstitial prominence concerning for severe acute bronchitis. 2. Aortic atherosclerosis. Electronically Signed   By: Vinnie Langton M.D.   On: 11/25/2015 19:51    EKG:   Orders placed or performed during the hospital encounter of 11/25/15  . ED EKG  . ED EKG  . EKG 12-Lead  . EKG 12-Lead  IMPRESSION AND PLAN:  Principal Problem:   COPD exacerbation ([place]) - patient was given IV steroids, IV magnesium, duo nebs, and antibiotics in the ED. We will continue IV steroids, antibiotics, and when necessary nebs. Active Problems:   Benign essential HTN - stable, continue home meds   HLD (hyperlipidemia) - continue home meds   Depression - continue home antidepressants  All the records are reviewed and case discussed with ED provider. Management plans discussed with the patient and/or family.  DVT PROPHYLAXIS: SubQ lovenox  GI PROPHYLAXIS: None  ADMISSION STATUS: Inpatient  CODE STATUS: Full Code Status History    This patient does not have a recorded code status. Please  follow your organizational policy for patients in this situation.      TOTAL TIME TAKING CARE OF THIS PATIENT: 45 minutes.    Jenese Mischke FIELDING 11/25/2015, 8:48 PM  [facility]  Office  [phone]  CC: Primary care physician; Tommi Rumps, MD

## 2015-11-25 NOTE — ED Triage Notes (Signed)
Pt came to ED via EMS from home c/o sob for the past 2-3 days. Reports cleaning out garage Sunday and developed a cough a few days later. Received 1 albuterol treatment by EMS.

## 2015-11-25 NOTE — Progress Notes (Signed)
Patient lactic acid 3.7. MD notified. Orders received.

## 2015-11-26 DIAGNOSIS — I10 Essential (primary) hypertension: Secondary | ICD-10-CM | POA: Diagnosis not present

## 2015-11-26 DIAGNOSIS — E785 Hyperlipidemia, unspecified: Secondary | ICD-10-CM | POA: Diagnosis not present

## 2015-11-26 DIAGNOSIS — F329 Major depressive disorder, single episode, unspecified: Secondary | ICD-10-CM | POA: Diagnosis not present

## 2015-11-26 DIAGNOSIS — J441 Chronic obstructive pulmonary disease with (acute) exacerbation: Secondary | ICD-10-CM | POA: Diagnosis not present

## 2015-11-26 LAB — BASIC METABOLIC PANEL
Anion gap: 6 (ref 5–15)
BUN: 11 mg/dL (ref 6–20)
CALCIUM: 8.4 mg/dL — AB (ref 8.9–10.3)
CO2: 26 mmol/L (ref 22–32)
CREATININE: 0.98 mg/dL (ref 0.44–1.00)
Chloride: 107 mmol/L (ref 101–111)
GFR calc Af Amer: >60 mL/min (ref >60)
GFR, EST NON AFRICAN AMERICAN: 58 mL/min — AB (ref >60)
GLUCOSE: 162 mg/dL — AB (ref 65–99)
Potassium: 3 mmol/L — ABNORMAL LOW (ref 3.5–5.1)
Sodium: 139 mmol/L (ref 135–145)

## 2015-11-26 LAB — CBC
HCT: 35.6 % (ref 35.0–47.0)
HEMOGLOBIN: 12.1 g/dL (ref 12.0–16.0)
MCH: 28.4 pg (ref 26.0–34.0)
MCHC: 33.9 g/dL (ref 32.0–36.0)
MCV: 83.7 fL (ref 80.0–100.0)
PLATELETS: 144 10*3/uL — AB (ref 150–440)
RBC: 4.25 MIL/uL (ref 3.80–5.20)
RDW: 15.2 % — AB (ref 11.5–14.5)
WBC: 7.8 10*3/uL (ref 3.6–11.0)

## 2015-11-26 LAB — MAGNESIUM: MAGNESIUM: 2.1 mg/dL (ref 1.7–2.4)

## 2015-11-26 LAB — LACTIC ACID, PLASMA: LACTIC ACID, VENOUS: 2 mmol/L — AB (ref 0.5–1.9)

## 2015-11-26 MED ORDER — AZITHROMYCIN 500 MG PO TABS
500.0000 mg | ORAL_TABLET | Freq: Every day | ORAL | Status: DC
  Administered 2015-11-26 – 2015-11-27 (×2): 500 mg via ORAL
  Filled 2015-11-26 (×2): qty 1

## 2015-11-26 MED ORDER — POTASSIUM CHLORIDE CRYS ER 20 MEQ PO TBCR
40.0000 meq | EXTENDED_RELEASE_TABLET | ORAL | Status: AC
  Administered 2015-11-26 (×2): 40 meq via ORAL
  Filled 2015-11-26 (×2): qty 2

## 2015-11-26 MED ORDER — PREDNISONE 50 MG PO TABS
50.0000 mg | ORAL_TABLET | Freq: Every day | ORAL | Status: DC
Start: 2015-11-27 — End: 2015-11-28
  Administered 2015-11-27: 50 mg via ORAL
  Filled 2015-11-26: qty 1

## 2015-11-26 MED ORDER — POTASSIUM CHLORIDE CRYS ER 20 MEQ PO TBCR: 40.0000 meq | EXTENDED_RELEASE_TABLET | Freq: Two times a day (BID) | ORAL | Status: DC

## 2015-11-26 NOTE — Progress Notes (Signed)
Patient lactic acid 2.0. MD notified. Will place order in EPIC

## 2015-11-26 NOTE — Progress Notes (Signed)
Patient K+ 3.0. MD notified> Acknowledged. States he will put orders in [facility]

## 2015-11-26 NOTE — Progress Notes (Signed)
[place] [facility] [place] NAME: Anne [name]    MR#:  [id number]  DATE OF BIRTH:  [birth year]  SUBJECTIVE:  CHIEF COMPLAINT:   Chief Complaint  Patient presents with  . Shortness of Breath   Cough and SOB. REVIEW OF SYSTEMS:  Review of Systems  Constitutional: Negative for chills, fever and malaise/fatigue.  HENT: Negative for congestion and sore throat.   Eyes: Negative for blurred vision and double vision.  Respiratory: Positive for cough, sputum production, shortness of breath and wheezing. Negative for hemoptysis.   Cardiovascular: Negative for chest pain, palpitations and leg swelling.  Gastrointestinal: Negative for abdominal pain, blood in stool, melena, nausea and vomiting.  Genitourinary: Negative for dysuria, frequency and urgency.  Musculoskeletal: Negative for joint pain.  Skin: Negative for itching and rash.  Neurological: Negative for dizziness, tingling, focal weakness and loss of consciousness.  Psychiatric/Behavioral: Negative for depression. The patient is not nervous/anxious.     DRUG ALLERGIES:  No Known Allergies VITALS:  Blood pressure 103/88, pulse 96, temperature 97.5 F (36.4 C), temperature source Oral, resp. rate 18, height '5\' 6"'$  (1.676 m), weight 214 lb 14.4 oz (97.5 kg), SpO2 94 %. PHYSICAL EXAMINATION:  Physical Exam  Constitutional: She is oriented to person, place, and time and well-developed, well-nourished, and in no distress. No distress.  HENT:  Head: Normocephalic.  Mouth/Throat: Oropharynx is clear and moist.  Eyes: Conjunctivae are normal. No scleral icterus.  Neck: Normal range of motion. Neck supple. No JVD present. No tracheal deviation present. No thyromegaly present.  Cardiovascular: Normal rate, regular rhythm and normal heart sounds.  Exam reveals no gallop.   No murmur heard. Pulmonary/Chest: Effort normal. No respiratory distress. She has no wheezes. She has no rales.  Very diminished  lung sounds  Abdominal: Soft. Bowel sounds are normal. She exhibits no distension. There is no tenderness.  Musculoskeletal: Normal range of motion. She exhibits no edema or tenderness.  Neurological: She is alert and oriented to person, place, and time. No cranial nerve deficit.  Skin: Skin is dry.  Psychiatric: Affect and judgment normal.   LABORATORY PANEL:   CBC  Recent Labs Lab 11/26/15 0143  WBC 7.8  HGB 12.1  HCT 35.6  PLT 144*   ------------------------------------------------------------------------------------------------------------------ Chemistries   Recent Labs Lab 11/25/15 1905 11/26/15 0143 11/26/15 0856  NA 141 139  --   K 2.8* 3.0*  --   CL 100* 107  --   CO2 31 26  --   GLUCOSE 142* 162*  --   BUN 12 11  --   CREATININE 1.16* 0.98  --   CALCIUM 9.2 8.4*  --   MG 1.7  --  2.1  AST 30  --   --   ALT 22  --   --   ALKPHOS 153*  --   --   BILITOT 0.9  --   --    RADIOLOGY:  Dg Chest 2 View  Result Date: 11/25/2015 CLINICAL DATA:  65 year old female with history of shortness of breath for the past 2-3 days after cleaning out her garage. Cough. EXAM: CHEST  2 VIEW COMPARISON:  Chest x-ray 12/15/2009. FINDINGS: Diffuse peribronchial cuffing and mild diffuse interstitial prominence. Lung volumes are normal. No consolidative airspace disease. No pleural effusions. No pneumothorax. No pulmonary nodule or mass noted. Pulmonary vasculature and the cardiomediastinal silhouette are within normal limits. Aortic atherosclerosis. IMPRESSION: 1. Diffuse peribronchial cuffing and interstitial prominence concerning for severe acute bronchitis.  2. Aortic atherosclerosis. Electronically Signed   By: Vinnie Langton M.D.   On: 11/25/2015 19:51   ASSESSMENT AND PLAN:    COPD exacerbation  She has been treated with IV steroids, IV magnesium, duo nebs, and antibiotics. Try to wean off O2NC.  Hypokalemia. Given KCl, f/u K.    Benign essential HTN - stable, continue  home meds   HLD (hyperlipidemia) - continue home meds   Depression - continue home antidepressants   All the records are reviewed and case discussed with Care Management/Social Worker. Management plans discussed with the patient, family and they are in agreement.  CODE STATUS: full code.  TOTAL TIME TAKING CARE OF THIS PATIENT: 35 minutes.   More than 50% of the time was spent in counseling/coordination of care: YES  POSSIBLE D/C IN 1-2 DAYS, DEPENDING ON CLINICAL CONDITION.   Demetrios Loll M.D on 11/26/2015 at 2:23 PM  Between 7am to 6pm - Pager - [phone]  After 6pm go to [email or web address] - Proofreader  [facility]  Office  [phone]  CC: Primary care physician; Tommi Rumps, MD  Note: This dictation was prepared with Dragon dictation along with smaller phrase technology. Any transcriptional errors that result from this process are unintentional.

## 2015-11-27 DIAGNOSIS — F329 Major depressive disorder, single episode, unspecified: Secondary | ICD-10-CM | POA: Diagnosis not present

## 2015-11-27 DIAGNOSIS — E785 Hyperlipidemia, unspecified: Secondary | ICD-10-CM | POA: Diagnosis not present

## 2015-11-27 DIAGNOSIS — J441 Chronic obstructive pulmonary disease with (acute) exacerbation: Secondary | ICD-10-CM | POA: Diagnosis not present

## 2015-11-27 DIAGNOSIS — I10 Essential (primary) hypertension: Secondary | ICD-10-CM | POA: Diagnosis not present

## 2015-11-27 LAB — BASIC METABOLIC PANEL
ANION GAP: 7 (ref 5–15)
BUN: 21 mg/dL — AB (ref 6–20)
CO2: 26 mmol/L (ref 22–32)
Calcium: 9.2 mg/dL (ref 8.9–10.3)
Chloride: 110 mmol/L (ref 101–111)
Creatinine, Ser: 0.94 mg/dL (ref 0.44–1.00)
GFR calc Af Amer: >60 mL/min (ref >60)
GFR calc non Af Amer: >60 mL/min (ref >60)
GLUCOSE: 113 mg/dL — AB (ref 65–99)
POTASSIUM: 4 mmol/L (ref 3.5–5.1)
Sodium: 143 mmol/L (ref 135–145)

## 2015-11-27 MED ORDER — ALBUTEROL SULFATE HFA 108 (90 BASE) MCG/ACT IN AERS: 2.0000 | INHALATION_SPRAY | Freq: Four times a day (QID) | RESPIRATORY_TRACT | 2 refills | Status: DC | PRN

## 2015-11-27 NOTE — Progress Notes (Signed)
DISCHARGE NOTE:  Pt given discharge note and prescription. Pt verbalized understanding. Pt wheeled to car by staff.

## 2015-11-27 NOTE — Discharge Summary (Signed)
[place] at [place] NAME: Anne [name]    MR#:  [id number]  DATE OF BIRTH:  [birth year]  DATE OF ADMISSION:  11/25/2015   ADMITTING PHYSICIAN: Lance Coon, MD  DATE OF DISCHARGE: 11/27/2015 PRIMARY CARE PHYSICIAN: Tommi Rumps, MD   ADMISSION DIAGNOSIS:  Acute respiratory failure with hypoxia ([place]) [J96.01] DISCHARGE DIAGNOSIS:  Principal Problem:   COPD exacerbation ([place]) Active Problems:   Benign essential HTN   HLD (hyperlipidemia)   Depression  SECONDARY DIAGNOSIS:   Past Medical History:  Diagnosis Date  . Arthritis   . Depression   . Hyperlipidemia   . Hypertension    HOSPITAL COURSE:   COPD exacerbation  She has been treated with IV steroids, IV magnesium, duo nebs, and antibiotics. off O2NC. No symptoms. Give albuterol prn.  Hypokalemia. Given KCl, improved. Benign essential HTN - stable, continue home meds HLD (hyperlipidemia) - continue home meds Depression - continue home antidepressants  DISCHARGE CONDITIONS:  Stable, discharge to home today. CONSULTS OBTAINED:   DRUG ALLERGIES:  No Known Allergies DISCHARGE MEDICATIONS:     Medication List    TAKE these medications   albuterol 108 (90 Base) MCG/ACT inhaler Commonly known as:  PROVENTIL HFA;VENTOLIN HFA Inhale 2 puffs into the lungs every 6 (six) hours as needed for wheezing or shortness of breath.   cyclobenzaprine 10 MG tablet Commonly known as:  FLEXERIL Take 1 tablet (10 mg total) by mouth at bedtime.   EYE HEALTH FORMULA PO Take by mouth.   FISH OIL + D3 1000-1000 MG-UNIT Caps Take by mouth.   lovastatin 40 MG tablet Commonly known as:  MEVACOR Take 2 tablets (80 mg total) by mouth at bedtime.   meloxicam 15 MG tablet Commonly known as:  MOBIC Take 1 tablet (15 mg total) by mouth daily.   PARoxetine 20 MG tablet Commonly known as:  PAXIL TAKE ONE TABLET BY MOUTH ONCE DAILY   [facility] Wort 150 MG Caps Take 1  capsule by mouth daily.   triamterene-hydrochlorothiazide 37.5-25 MG tablet Commonly known as:  MAXZIDE-25 TAKE ONE TABLET BY MOUTH ONCE DAILY   Vitamin D3 1000 units Caps Take 1 capsule by mouth daily.        DISCHARGE INSTRUCTIONS:   DIET:  Heart healthy diet. DISCHARGE CONDITION:  Stable. ACTIVITY:  As tolerated.  If you experience worsening of your admission symptoms, develop shortness of breath, life threatening emergency, suicidal or homicidal thoughts you must seek medical attention immediately by calling 911 or calling your MD immediately  if symptoms less severe.  You Must read complete instructions/literature along with all the possible adverse reactions/side effects for all the Medicines you take and that have been prescribed to you. Take any new Medicines after you have completely understood and accpet all the possible adverse reactions/side effects.   Please note  You were cared for by a hospitalist during your hospital stay. If you have any questions about your discharge medications or the care you received while you were in the hospital after you are discharged, you can call the unit and asked to speak with the hospitalist on call if the hospitalist that took care of you is not available. Once you are discharged, your primary care physician will handle any further medical issues. Please note that NO REFILLS for any discharge medications will be authorized once you are discharged, as it is imperative that you return to your primary care physician (or establish a relationship with a  primary care physician if you do not have one) for your aftercare needs so that they can reassess your need for medications and monitor your lab values.    On the day of Discharge:  VITAL SIGNS:  Blood pressure 123/67, pulse 79, temperature 97.5 F (36.4 C), temperature source Oral, resp. rate 18, height '5\' 6"'$  (1.676 m), weight 214 lb 14.4 oz (97.5 kg), SpO2 94 %. PHYSICAL EXAMINATION:    GENERAL:  65 y.o.-year-old patient lying in the bed with no acute distress. Obese. EYES: Pupils equal, round, reactive to light and accommodation. No scleral icterus. Extraocular muscles intact.  HEENT: Head atraumatic, normocephalic. Oropharynx and nasopharynx clear.  NECK:  Supple, no jugular venous distention. No thyroid enlargement, no tenderness.  LUNGS: Normal breath sounds bilaterally, no wheezing, rales,rhonchi or crepitation. No use of accessory muscles of respiration.  CARDIOVASCULAR: S1, S2 normal. No murmurs, rubs, or gallops.  ABDOMEN: Soft, non-tender, non-distended. Bowel sounds present. No organomegaly or mass.  EXTREMITIES: No pedal edema, cyanosis, or clubbing.  NEUROLOGIC: Cranial nerves II through XII are intact. Muscle strength 5/5 in all extremities. Sensation intact. Gait not checked.  PSYCHIATRIC: The patient is alert and oriented x 3.  SKIN: No obvious rash, lesion, or ulcer.  DATA REVIEW:   CBC  Recent Labs Lab 11/26/15 0143  WBC 7.8  HGB 12.1  HCT 35.6  PLT 144*    Chemistries   Recent Labs Lab 11/25/15 1905  11/26/15 0856 11/27/15 0408  NA 141  < >  --  143  K 2.8*  < >  --  4.0  CL 100*  < >  --  110  CO2 31  < >  --  26  GLUCOSE 142*  < >  --  113*  BUN 12  < >  --  21*  CREATININE 1.16*  < >  --  0.94  CALCIUM 9.2  < >  --  9.2  MG 1.7  --  2.1  --   AST 30  --   --   --   ALT 22  --   --   --   ALKPHOS 153*  --   --   --   BILITOT 0.9  --   --   --   < > = values in this interval not displayed.   Microbiology Results  Results for orders placed or performed during the hospital encounter of 11/25/15  Blood culture (routine x 2)     Status: None (Preliminary result)   Collection Time: 11/25/15  7:12 PM  Result Value Ref Range Status   Specimen Description BLOOD LEFT ASSIST CONTROL  Final   Special Requests   Final    BOTTLES DRAWN AEROBIC AND ANAEROBIC 11CCAERO,9CCANA   Culture NO GROWTH 2 DAYS  Final   Report Status PENDING   Incomplete  Blood culture (routine x 2)     Status: None (Preliminary result)   Collection Time: 11/25/15  8:03 PM  Result Value Ref Range Status   Specimen Description BLOOD LEFT FOREARM  Final   Special Requests   Final    BOTTLES DRAWN AEROBIC AND ANAEROBIC 13CCAERO,9CCANA   Culture NO GROWTH 2 DAYS  Final   Report Status PENDING  Incomplete    RADIOLOGY:  No results found.   Management plans discussed with the patient, family and they are in agreement.  CODE STATUS:     Code Status Orders        Start  Ordered   11/25/15 2143  Full code  Continuous     11/25/15 2142    Code Status History    Date Active Date Inactive Code Status Order ID Comments User Context   This patient has a current code status but no historical code status.      TOTAL TIME TAKING CARE OF THIS PATIENT: 32 minutes.    Demetrios Loll M.D on 11/27/2015 at 12:30 PM  Between 7am to 6pm - Pager - [phone]  After 6pm go to [email or web address] - Proofreader  [facility]  Office  [phone]  CC: Primary care physician; Tommi Rumps, MD   Note: This dictation was prepared with Dragon dictation along with smaller phrase technology. Any transcriptional errors that result from this process are unintentional.

## 2015-11-27 NOTE — Discharge Instructions (Signed)
Heart healthy diet. °Activity as tolerated. °

## 2015-11-27 NOTE — Care Management Important Message (Signed)
Important Message  Patient Details  Name: Anne [name] MRN: [id number] Date of Birth: 12-16-1950   Medicare Important Message Given:  Yes    Carles Collet, RN 11/27/2015, 9:34 AM

## 2015-11-28 ENCOUNTER — Telehealth: Payer: Self-pay

## 2015-11-28 NOTE — Telephone Encounter (Signed)
Transition Care Management Follow-up Telephone Call  How have you been since you were released from the hospital? Tired, but feeling good  Do you understand why you were in the hospital? Yes, had a virus per the md and then the nurses told her that she had COPD.    Do you understand the discharge instrcutions?Yes,  No questions  Items Reviewed:  Medications reviewed: added ventolin  Allergies reviewed: no changes  Dietary changes reviewed: heart healthy diet  Referrals reviewed: no referral made   Functional Questionnaire:   Activities of Daily Living (ADLs):   She states they are independent in the following: taking it easy, but able to complete all ADL's States they require assistance with the following: none   Any transportation issues/concerns?: no issues    Any patient concerns? No concerns   Confirmed importance and date/time of follow-up visits scheduled: yes, Thursday the 26th at 1130am   Confirmed with patient if condition begins to worsen call PCP or go to the ER.  Patient was given the Call-a-Nurse line [phone]: yes

## 2015-11-30 LAB — CULTURE, BLOOD (ROUTINE X 2)
CULTURE: NO GROWTH
CULTURE: NO GROWTH

## 2015-12-01 ENCOUNTER — Encounter: Payer: Self-pay | Admitting: Family Medicine

## 2015-12-01 ENCOUNTER — Ambulatory Visit (INDEPENDENT_AMBULATORY_CARE_PROVIDER_SITE_OTHER): Payer: PPO | Admitting: Family Medicine

## 2015-12-01 DIAGNOSIS — Z8709 Personal history of other diseases of the respiratory system: Secondary | ICD-10-CM

## 2015-12-01 DIAGNOSIS — J40 Bronchitis, not specified as acute or chronic: Secondary | ICD-10-CM

## 2015-12-01 DIAGNOSIS — Z09 Encounter for follow-up examination after completed treatment for conditions other than malignant neoplasm: Secondary | ICD-10-CM | POA: Diagnosis not present

## 2015-12-01 NOTE — Patient Instructions (Signed)
Nice to see you. I'm glad you are feeling better. We'll get you set up for lung function testing to evaluate for COPD. You should continue your albuterol inhaler as needed. If you develop trouble breathing, wheezing, fevers, or any new or changing symptoms please seek medical attention medially.

## 2015-12-01 NOTE — Assessment & Plan Note (Signed)
Patient admitted for bronchitis to the hospital. Does have a smoking history and they treated her for presumed COPD exacerbation. She was not discharged on prednisone or antibiotics. She is not having any symptoms now other than feeling tired. I discussed that this may last for some time given her recent illness. We will order PFTs to evaluate for COPD. These will be performed in several weeks when she is feeling better. She will continue albuterol as needed. All of her medications were reviewed with her. She is given return precautions.

## 2015-12-01 NOTE — Progress Notes (Signed)
  Tommi Rumps, MD Phone: [phone]  Anne [name] is a 65 y.o. female who presents today for hospital follow-up.  Bronchitis: patient admitted to the hospital for bronchitis vs COPD exacerbation. No prior diagnosis of COPD. Notes she developed progressive shortness of breath over the period of a day and then went to the fire house to be evaluated. She was then transported to the emergency room. She was placed on IV steroids, IV magnesium, antibiotics (of which there is no documentation in any of the notes which one she was placed on), and duo nebs. She was discharged after several days in the hospital. Her breathing is at baseline now. No shortness of breath. She's not using the inhaler she was discharged on. She just feels tired. Notes no cough. No fevers. No chest pain. She used to smoke and quit 3 years ago. Has smoked for about 48 years. 2-2.5 packs per day at the most. No prior PFTs.  PMH: Former smoker   ROS see history of present illness  Objective  Physical Exam Vitals:   12/01/15 1126  BP: 130/82  Pulse: 87  Temp: 98.2 F (36.8 C)    BP Readings from Last 3 Encounters:  12/01/15 130/82  11/27/15 123/67  11/18/15 120/76   Wt Readings from Last 3 Encounters:  12/01/15 209 lb 6.4 oz (95 kg)  11/26/15 214 lb 14.4 oz (97.5 kg)  11/18/15 211 lb 6.4 oz (95.9 kg)    Physical Exam  Constitutional: She is well-developed, well-nourished, and in no distress.  HENT:  Head: Normocephalic and atraumatic.  Mouth/Throat: Oropharynx is clear and moist. No oropharyngeal exudate.  Eyes: Conjunctivae are normal. Pupils are equal, round, and reactive to light.  Cardiovascular: Normal rate, regular rhythm and normal heart sounds.   Pulmonary/Chest: Effort normal and breath sounds normal.  Musculoskeletal: She exhibits no edema.  Neurological: She is alert. Gait normal.  Skin: Skin is warm and dry.     Assessment/Plan: Please see individual problem  list.  Bronchitis Patient admitted for bronchitis to the hospital. Does have a smoking history and they treated her for presumed COPD exacerbation. She was not discharged on prednisone or antibiotics. She is not having any symptoms now other than feeling tired. I discussed that this may last for some time given her recent illness. We will order PFTs to evaluate for COPD. These will be performed in several weeks when she is feeling better. She will continue albuterol as needed. All of her medications were reviewed with her. She is given return precautions.   Orders Placed This Encounter  Procedures  . Pulmonary function test    Standing Status:   Future    Standing Expiration Date:   11/30/2016    Scheduling Instructions:     Please have a pulmonologist read this. Thanks.          Please schedule at least 2 weeks from now.    Order Specific Question:   Where should this test be performed?    Answer:   [facility]    Order Specific Question:   Full PFT: includes the following: basic spirometry, spirometry pre & post bronchodilator, diffusion capacity (DLCO), lung volumes    Answer:   Full PFT    Tommi Rumps, MD [place]

## 2015-12-01 NOTE — Progress Notes (Signed)
Pre visit review using our clinic review tool, if applicable. No additional management support is needed unless otherwise documented below in the visit note. 

## 2015-12-12 ENCOUNTER — Ambulatory Visit: Payer: PPO | Admitting: Family Medicine

## 2015-12-14 ENCOUNTER — Ambulatory Visit (INDEPENDENT_AMBULATORY_CARE_PROVIDER_SITE_OTHER): Payer: PPO | Admitting: *Deleted

## 2015-12-14 DIAGNOSIS — J40 Bronchitis, not specified as acute or chronic: Secondary | ICD-10-CM

## 2015-12-14 LAB — PULMONARY FUNCTION TEST
DL/VA % pred: 88 %
DL/VA: 4.48 ml/min/mmHg/L
DLCO UNC % PRED: 77 %
DLCO UNC: 20.87 ml/min/mmHg
FEF 25-75 Post: 1.5 L/sec
FEF 25-75 Pre: 1.12 L/sec
FEF2575-%CHANGE-POST: 33 %
FEF2575-%PRED-POST: 70 %
FEF2575-%Pred-Pre: 52 %
FEV1-%CHANGE-POST: 8 %
FEV1-%PRED-POST: 80 %
FEV1-%Pred-Pre: 74 %
FEV1-POST: 2.02 L
FEV1-Pre: 1.87 L
FEV1FVC-%Change-Post: 1 %
FEV1FVC-%Pred-Pre: 89 %
FEV6-%Change-Post: 6 %
FEV6-%PRED-POST: 91 %
FEV6-%PRED-PRE: 85 %
FEV6-PRE: 2.74 L
FEV6-Post: 2.91 L
FEV6FVC-%CHANGE-POST: 0 %
FEV6FVC-%PRED-PRE: 103 %
FEV6FVC-%Pred-Post: 103 %
FVC-%Change-Post: 6 %
FVC-%PRED-POST: 87 %
FVC-%Pred-Pre: 82 %
FVC-Post: 2.92 L
FVC-Pre: 2.74 L
POST FEV1/FVC RATIO: 69 %
PRE FEV6/FVC RATIO: 100 %
Post FEV6/FVC ratio: 100 %
Pre FEV1/FVC ratio: 68 %

## 2015-12-14 NOTE — Progress Notes (Signed)
PFT performed today with Nitrogen washout. 

## 2016-02-08 ENCOUNTER — Other Ambulatory Visit: Payer: Self-pay | Admitting: Family Medicine

## 2016-02-08 NOTE — Telephone Encounter (Signed)
Sent to pharmacy 

## 2016-02-08 NOTE — Telephone Encounter (Signed)
Last filled 10/11/15 30 2rf

## 2016-02-21 ENCOUNTER — Ambulatory Visit (INDEPENDENT_AMBULATORY_CARE_PROVIDER_SITE_OTHER): Payer: PPO | Admitting: Family Medicine

## 2016-02-21 ENCOUNTER — Encounter: Payer: Self-pay | Admitting: Family Medicine

## 2016-02-21 VITALS — BP 128/72 | HR 91 | Temp 98.2°F | Wt 215.2 lb

## 2016-02-21 DIAGNOSIS — E785 Hyperlipidemia, unspecified: Secondary | ICD-10-CM | POA: Diagnosis not present

## 2016-02-21 DIAGNOSIS — I739 Peripheral vascular disease, unspecified: Secondary | ICD-10-CM | POA: Diagnosis not present

## 2016-02-21 DIAGNOSIS — I10 Essential (primary) hypertension: Secondary | ICD-10-CM

## 2016-02-21 DIAGNOSIS — M5440 Lumbago with sciatica, unspecified side: Secondary | ICD-10-CM

## 2016-02-21 DIAGNOSIS — N3941 Urge incontinence: Secondary | ICD-10-CM

## 2016-02-21 DIAGNOSIS — G8929 Other chronic pain: Secondary | ICD-10-CM

## 2016-02-21 LAB — POCT URINALYSIS DIPSTICK
BILIRUBIN UA: NEGATIVE
Blood, UA: NEGATIVE
GLUCOSE UA: NEGATIVE
Ketones, UA: NEGATIVE
LEUKOCYTES UA: NEGATIVE
NITRITE UA: NEGATIVE
Protein, UA: NEGATIVE
Spec Grav, UA: >=1.03
Urobilinogen, UA: 0.2
pH, UA: 6

## 2016-02-21 MED ORDER — MIRABEGRON ER 25 MG PO TB24: 25.0000 mg | ORAL_TABLET | Freq: Every day | ORAL | 3 refills | Status: DC

## 2016-02-21 NOTE — Progress Notes (Signed)
Tommi Rumps, MD Phone: [phone]  Anne [name] is a 66 y.o. female who presents today for f/u.  HYPERTENSION  Disease Monitoring  Home BP Monitoring similar to today Chest pain- no    Dyspnea- no Medications  Compliance-  Taking triamterene/HCTZ.  Edema- no  HYPERLIPIDEMIA Symptoms Chest pain on exertion:  no   Leg claudication:   Yes, possibly in right leg Medications: Compliance-taking lovastatin Right upper quadrant pain- no  Muscle aches- no  Chronic back pain: Patient notes prior history of surgery on her lumbar spine. Does take Flexeril every other night. Notes no worsening of her pain. Occasionally does radiate to her right leg. This is chronic. Notes during the winters for several year she's had numbness in her posterior right leg near her heel and foot intermittently only when she sits down. Does not have this issue in the summer as she is more active. No weakness. No bowel incontinence. No saddle anesthesia.  Urge incontinence: Patient notes for about 4 months she had the urge to go to the bathroom and can't quite make it to the bathroom prior to leaking some urine. Had some dysuria initially though none in the last 3 months. No frequency or stress incontinence. Has not worsened or improved. Not associated with any worsening of her back pain   PMH: Former smoker   ROS see history of present illness  Objective  Physical Exam Vitals:   02/21/16 0909  BP: 128/72  Pulse: 91  Temp: 98.2 F (36.8 C)    BP Readings from Last 3 Encounters:  02/21/16 128/72  12/01/15 130/82  11/27/15 123/67   Wt Readings from Last 3 Encounters:  02/21/16 215 lb 3.2 oz (97.6 kg)  12/01/15 209 lb 6.4 oz (95 kg)  11/26/15 214 lb 14.4 oz (97.5 kg)    Physical Exam  Constitutional: No distress.  Cardiovascular: Normal rate, regular rhythm and normal heart sounds.   2+ DP and PT pulses bilaterally  Pulmonary/Chest: Effort normal and breath sounds normal.  Abdominal: Soft.  Bowel sounds are normal. She exhibits no distension. There is no tenderness.  Musculoskeletal: She exhibits no edema.  No midline spine tenderness, no midline spine step-off, no muscular back tenderness  Neurological: She is alert. Gait normal.  5 out of 5 strength bilateral quads, hamstrings, plantar flexion, and dorsiflexion, sensation to light touch intact bilateral lower extremities  Skin: Skin is warm and dry. She is not diaphoretic.     Assessment/Plan: Please see individual problem list.  Benign essential HTN At goal. Continue current medications.  HLD (hyperlipidemia) Tolerating lovastatin. Continue lovastatin. Patient does complain of some possible claudication symptoms in her right calf. We will obtain ABIs to evaluate this.  Chronic low back pain Chronic issue. Unchanged from previously. Occasional numbness she has could be due to nerve impingement or positionally related. Neurologically intact in her lower extremities at this time. Discussed continuing Flexeril as needed. Continue to monitor. If worsens she'll let us know. Given return precautions.  Urge incontinence of urine Patient with about 4 months of urinary urge incontinence. Suspect related to overactive bladder. I do not believe this is related to her back issues given that she has had stable back symptoms and no new neurological issues with this. Urine was checked today and was unremarkable. We will trial Myrbetriq to see if this is beneficial for her. If it is too expensive she will let us know so that we can trial an additional medication. If medication is not beneficial she  will let us know in the next month or so for reevaluation.   Orders Placed This Encounter  Procedures  . [place] ABI W/WO TBI    Standing Status:   Future    Standing Expiration Date:   04/20/2017    Order Specific Question:   Reason for Exam (SYMPTOM  OR DIAGNOSIS REQUIRED)    Answer:   possible claudication in right leg, intact pulses    Order  Specific Question:   Preferred imaging location?    Answer:   [facility]  . POCT Urinalysis Dipstick    Meds ordered this encounter  Medications  . mirabegron ER (MYRBETRIQ) 25 MG TB24 tablet    Sig: Take 1 tablet (25 mg total) by mouth daily.    Dispense:  30 tablet    Refill:  [place], MD [place]

## 2016-02-21 NOTE — Assessment & Plan Note (Signed)
Tolerating lovastatin. Continue lovastatin. Patient does complain of some possible claudication symptoms in her right calf. We will obtain ABIs to evaluate this.

## 2016-02-21 NOTE — Progress Notes (Signed)
Pre visit review using our clinic review tool, if applicable. No additional management support is needed unless otherwise documented below in the visit note. 

## 2016-02-21 NOTE — Patient Instructions (Signed)
Nice to see you.  We will try you on Myrbetriq to see if this will help with your urgency. Please see how much this costs and if it is too expensive please let us know. We will get set up for ABIs to evaluate your calf pain. If you develop numbness, weakness, change in bowel or bladder function, numbness between her legs, or any new or changing symptoms please seek medical attention immediately.

## 2016-02-21 NOTE — Assessment & Plan Note (Signed)
Patient with about 4 months of urinary urge incontinence. Suspect related to overactive bladder. I do not believe this is related to her back issues given that she has had stable back symptoms and no new neurological issues with this. Urine was checked today and was unremarkable. We will trial Myrbetriq to see if this is beneficial for her. If it is too expensive she will let us know so that we can trial an additional medication. If medication is not beneficial she will let us know in the next month or so for reevaluation.

## 2016-02-21 NOTE — Assessment & Plan Note (Signed)
At goal. Continue current medications. 

## 2016-02-21 NOTE — Assessment & Plan Note (Signed)
Chronic issue. Unchanged from previously. Occasional numbness she has could be due to nerve impingement or positionally related. Neurologically intact in her lower extremities at this time. Discussed continuing Flexeril as needed. Continue to monitor. If worsens she'll let us know. Given return precautions.

## 2016-03-09 ENCOUNTER — Encounter: Payer: Self-pay | Admitting: Family Medicine

## 2016-03-09 ENCOUNTER — Telehealth: Payer: Self-pay | Admitting: Family Medicine

## 2016-03-09 ENCOUNTER — Ambulatory Visit (INDEPENDENT_AMBULATORY_CARE_PROVIDER_SITE_OTHER): Payer: PPO | Admitting: Family Medicine

## 2016-03-09 ENCOUNTER — Other Ambulatory Visit: Payer: Self-pay | Admitting: Family Medicine

## 2016-03-09 DIAGNOSIS — A084 Viral intestinal infection, unspecified: Secondary | ICD-10-CM | POA: Diagnosis not present

## 2016-03-09 MED ORDER — ONDANSETRON 4 MG PO TBDP: 4.0000 mg | ORAL_TABLET | Freq: Three times a day (TID) | ORAL | 0 refills | Status: DC | PRN

## 2016-03-09 MED ORDER — PROMETHAZINE HCL 12.5 MG PO TABS: 12.5000 mg | ORAL_TABLET | Freq: Three times a day (TID) | ORAL | 0 refills | Status: DC | PRN

## 2016-03-09 NOTE — Patient Instructions (Signed)
Zofran as needed for nausea.  Aggressive fluids.  Slow increase in diet.  If you were unable to keep things down, he will need to go to the ER for IV fluids.  Take care  Dr. Lacinda Axon   Viral Gastroenteritis, Adult Viral gastroenteritis is also known as the stomach flu. This condition is caused by various viruses. These viruses can be passed from person to person very easily (are very contagious). This condition may affect your stomach, small intestine, and large intestine. It can cause sudden watery diarrhea, fever, and vomiting. Diarrhea and vomiting can make you feel weak and cause you to become dehydrated. You may not be able to keep fluids down. Dehydration can make you tired and thirsty, cause you to have a dry mouth, and decrease how often you urinate. Older adults and people with other diseases or a weak immune system are at higher risk for dehydration. It is important to replace the fluids that you lose from diarrhea and vomiting. If you become severely dehydrated, you may need to get fluids through an IV tube. What are the causes? Gastroenteritis is caused by various viruses, including rotavirus and norovirus. Norovirus is the most common cause in adults. You can get sick by eating food, drinking water, or touching a surface contaminated with one of these viruses. You can also get sick from sharing utensils or other personal items with an infected person. What increases the risk? This condition is more likely to develop in people:  Who have a weak defense system (immune system).  Who live with one or more children who are younger than 52 years old.  Who live in a nursing home.  Who go on cruise ships. What are the signs or symptoms? Symptoms of this condition start suddenly 1-2 days after exposure to a virus. Symptoms may last a few days or as long as a week. The most common symptoms are watery diarrhea and vomiting. Other symptoms  include:  Fever.  Headache.  Fatigue.  Pain in the abdomen.  Chills.  Weakness.  Nausea.  Muscle aches.  Loss of appetite. How is this diagnosed? This condition is diagnosed with a medical history and physical exam. You may also have a stool test to check for viruses or other infections. How is this treated? This condition typically goes away on its own. The focus of treatment is to restore lost fluids (rehydration). Your health care provider may recommend that you take an oral rehydration solution (ORS) to replace important salts and minerals (electrolytes) in your body. Severe cases of this condition may require giving fluids through an IV tube. Treatment may also include medicine to help with your symptoms. Follow these instructions at home: Follow instructions from your health care provider about how to care for yourself at home. Eating and drinking Follow these recommendations as told by your health care provider:  Take an ORS. This is a drink that is sold at pharmacies and retail stores.  Drink clear fluids in small amounts as you are able. Clear fluids include water, ice chips, diluted fruit juice, and low-calorie sports drinks.  Eat bland, easy-to-digest foods in small amounts as you are able. These foods include bananas, applesauce, rice, lean meats, toast, and crackers.  Avoid fluids that contain a lot of sugar or caffeine, such as energy drinks, sports drinks, and soda.  Avoid alcohol.  Avoid spicy or fatty foods. General instructions  Drink enough fluid to keep your urine clear or pale yellow.  Wash your hands often.  If soap and water are not available, use hand sanitizer.  Make sure that all people in your household wash their hands well and often.  Take over-the-counter and prescription medicines only as told by your health care provider.  Rest at home while you recover.  Watch your condition for any changes.  Take a warm bath to relieve any burning  or pain from frequent diarrhea episodes.  Keep all follow-up visits as told by your health care provider. This is important. Contact a health care provider if:  You cannot keep fluids down.  Your symptoms get worse.  You have new symptoms.  You feel light-headed or dizzy.  You have muscle cramps. Get help right away if:  You have chest pain.  You feel extremely weak or you faint.  You see blood in your vomit.  Your vomit looks like coffee grounds.  You have bloody or black stools or stools that look like tar.  You have a severe headache, a stiff neck, or both.  You have a rash.  You have severe pain, cramping, or bloating in your abdomen.  You have trouble breathing or you are breathing very quickly.  Your heart is beating very quickly.  Your skin feels cold and clammy.  You feel confused.  You have pain when you urinate.  You have signs of dehydration, such as:  Dark urine, very little urine, or no urine.  Cracked lips.  Dry mouth.  Sunken eyes.  Sleepiness.  Weakness. This information is not intended to replace advice given to you by your health care provider. Make sure you discuss any questions you have with your health care provider. Document Released: 01/22/2005 Document Revised: 07/06/2015 Document Reviewed: 09/28/2014 Elsevier Interactive Patient Education  2017 [place] Choices to Help Relieve Diarrhea, Adult When you have diarrhea, the foods you eat and your eating habits are very important. Choosing the right foods and drinks can help relieve diarrhea. Also, because diarrhea can last up to 7 days, you need to replace lost fluids and electrolytes (such as sodium, potassium, and chloride) in order to help prevent dehydration. What general guidelines do I need to follow?  Slowly drink 1 cup (8 oz) of fluid for each episode of diarrhea. If you are getting enough fluid, your urine will be clear or pale yellow.  Eat starchy foods.  Some good choices include white rice, white toast, pasta, low-fiber cereal, baked potatoes (without the skin), saltine crackers, and bagels.  Avoid large servings of any cooked vegetables.  Limit fruit to two servings per day. A serving is  cup or 1 small piece.  Choose foods with less than 2 g of fiber per serving.  Limit fats to less than 8 tsp (38 g) per day.  Avoid fried foods.  Eat foods that have probiotics in them. Probiotics can be found in certain dairy products.  Avoid foods and beverages that may increase the speed at which food moves through the stomach and intestines (gastrointestinal tract). Things to avoid include:  High-fiber foods, such as dried fruit, raw fruits and vegetables, nuts, seeds, and whole grain foods.  Spicy foods and high-fat foods.  Foods and beverages sweetened with high-fructose corn syrup, honey, or sugar alcohols such as xylitol, sorbitol, and mannitol. What foods are recommended? Grains  White rice. White, [place], or pita breads (fresh or toasted), including plain rolls, buns, or bagels. White pasta. Saltine, soda, or graham crackers. Pretzels. Low-fiber cereal. Cooked cereals made with water (such  as cornmeal, farina, or cream cereals). Plain muffins. Matzo. Melba toast. Zwieback. Vegetables  Potatoes (without the skin). Strained tomato and vegetable juices. Most well-cooked and canned vegetables without seeds. Tender lettuce. Fruits  Cooked or canned applesauce, apricots, cherries, fruit cocktail, grapefruit, peaches, pears, or plums. Fresh bananas, apples without skin, cherries, grapes, cantaloupe, grapefruit, peaches, oranges, or plums. Meat and Other Protein Products  Baked or boiled chicken. Eggs. Tofu. Fish. Seafood. Smooth peanut butter. Ground or well-cooked tender beef, ham, veal, lamb, pork, or poultry. Dairy  Plain yogurt, kefir, and unsweetened liquid yogurt. Lactose-free milk, buttermilk, or soy milk. Plain hard cheese. Beverages   Sport drinks. Clear broths. Diluted fruit juices (except prune). Regular, caffeine-free sodas such as ginger ale. Water. Decaffeinated teas. Oral rehydration solutions. Sugar-free beverages not sweetened with sugar alcohols. Other  Bouillon, broth, or soups made from recommended foods. The items listed above may not be a complete list of recommended foods or beverages. Contact your dietitian for more options.  What foods are not recommended? Grains  Whole grain, whole wheat, bran, or rye breads, rolls, pastas, crackers, and cereals. Wild or brown rice. Cereals that contain more than 2 g of fiber per serving. Corn tortillas or taco shells. Cooked or dry oatmeal. Granola. Popcorn. Vegetables  Raw vegetables. Cabbage, broccoli, Brussels sprouts, artichokes, baked beans, beet greens, corn, kale, legumes, peas, sweet potatoes, and yams. Potato skins. Cooked spinach and cabbage. Fruits  Dried fruit, including raisins and dates. Raw fruits. Stewed or dried prunes. Fresh apples with skin, apricots, mangoes, pears, raspberries, and strawberries. Meat and Other Protein Products  Chunky peanut butter. Nuts and seeds. Beans and lentils. Berniece Salines. Dairy  High-fat cheeses. Milk, chocolate milk, and beverages made with milk, such as milk shakes. Cream. Ice cream. Sweets and Desserts  Sweet rolls, doughnuts, and sweet breads. Pancakes and waffles. Fats and Oils  Butter. Cream sauces. Margarine. Salad oils. Plain salad dressings. Olives. Avocados. Beverages  Caffeinated beverages (such as coffee, tea, soda, or energy drinks). Alcoholic beverages. Fruit juices with pulp. Prune juice. Soft drinks sweetened with high-fructose corn syrup or sugar alcohols. Other  Coconut. Hot sauce. Chili powder. Mayonnaise. Gravy. Cream-based or milk-based soups. The items listed above may not be a complete list of foods and beverages to avoid. Contact your dietitian for more information.  What should I do if I become  dehydrated? Diarrhea can sometimes lead to dehydration. Signs of dehydration include dark urine and dry mouth and skin. If you think you are dehydrated, you should rehydrate with an oral rehydration solution. These solutions can be purchased at pharmacies, retail stores, or online. Drink -1 cup (120-240 mL) of oral rehydration solution each time you have an episode of diarrhea. If drinking this amount makes your diarrhea worse, try drinking smaller amounts more often. For example, drink 1-3 tsp (5-15 mL) every 5-10 minutes. A general rule for staying hydrated is to drink 1-2 L of fluid per day. Talk to your health care provider about the specific amount you should be drinking each day. Drink enough fluids to keep your urine clear or pale yellow. This information is not intended to replace advice given to you by your health care provider. Make sure you discuss any questions you have with your health care provider. Document Released: 04/14/2003 Document Revised: 06/30/2015 Document Reviewed: 12/15/2012 Elsevier Interactive Patient Education  2017 [employer].

## 2016-03-09 NOTE — Telephone Encounter (Signed)
Please advise 

## 2016-03-09 NOTE — Telephone Encounter (Signed)
Rx sent 

## 2016-03-09 NOTE — Telephone Encounter (Signed)
Pharmacy called and stated that the Zofran is not covered by insurance it will be $95.00. Can we send over promethazine? Please advise, thank you!  [facility] [address] [place]

## 2016-03-09 NOTE — Progress Notes (Addendum)
Subjective:  Patient ID: Anne [name], female    DOB: 1950/03/16  Age: 66 y.o. MRN: [id number]  CC: N/V/D  HPI:  66 year old femalePresents with complaints of nausea, vomiting, diarrhea.  Patient states she's been sick since Wednesday morning. Developed suddenly. She's had continued nausea, vomiting, diarrhea since that time. She's had marked decrease in her PO intake. She has been able to tolerate some sips of Coca[employer]a. No associated fever. She does report chills. No associated abdominal pain. No known exacerbating or relieving factors. No reported sick contacts. No other complaints or concerns at this time.  Social Hx   Social History   Social History  . Marital status: Married    Spouse name: N/A  . Number of children: N/A  . Years of education: N/A   Social History Main Topics  . Smoking status: Former Smoker    Quit date: 02/07/2012  . Smokeless tobacco: Never Used  . Alcohol use 0.0 oz/week  . Drug use: No  . Sexual activity: No   Other Topics Concern  . None   Social History Narrative   Married   Retired   Clinical cytogeneticist level of education    No children    1 cup of coffee   Review of Systems  Constitutional: Positive for chills.  Gastrointestinal: Positive for diarrhea, nausea and vomiting. Negative for abdominal pain.   Objective:  BP 140/84   Pulse 81   Temp 97.4 F (36.3 C) (Oral)   Ht '5\' 6"'$  (1.676 m)   Wt 214 lb 6.4 oz (97.3 kg)   SpO2 93%   BMI 34.61 kg/m   BP/Weight 03/09/2016 02/21/2016 38/25/0539  Systolic BP 767 341 937  Diastolic BP 84 72 82  Wt. (Lbs) 214.4 215.2 209.4  BMI 34.61 34.73 33.8   Physical Exam  Constitutional: She is oriented to person, place, and time. She appears well-developed. No distress.  HENT:  Slightly dry mucous membranes.  Cardiovascular: Normal rate and regular rhythm.   2/6 systolic murmur.  Pulmonary/Chest: Effort normal and breath sounds normal.  Abdominal: She exhibits no distension.  Neurological:  She is alert and oriented to person, place, and time.  Psychiatric: She has a normal mood and affect.  Vitals reviewed.  Lab Results  Component Value Date   WBC 7.8 11/26/2015   HGB 12.1 11/26/2015   HCT 35.6 11/26/2015   PLT 144 (L) 11/26/2015   GLUCOSE 113 (H) 11/27/2015   CHOL 205 (H) 03/17/2015   TRIG 244.0 (H) 03/17/2015   HDL 47.20 03/17/2015   LDLDIRECT 131.0 08/15/2015   ALT 22 11/25/2015   AST 30 11/25/2015   NA 143 11/27/2015   K 4.0 11/27/2015   CL 110 11/27/2015   CREATININE 0.94 11/27/2015   BUN 21 (H) 11/27/2015   CO2 26 11/27/2015   TSH 2.721 08/16/2009   HGBA1C 6.0 03/17/2015   Assessment & Plan:   Problem List Items Addressed This Visit    Viral gastroenteritis    New acute problem. History consistent with viral gastritis. Treating with Zofran. If unable to keep down liquids, will need to go to the ER for IV fluids.        Meds ordered this encounter  Medications  . ondansetron (ZOFRAN ODT) 4 MG disintegrating tablet    Sig: Take 1 tablet (4 mg total) by mouth every 8 (eight) hours as needed for nausea or vomiting.    Dispense:  20 tablet    Refill:  0    Follow-up:  PRN  [facility] [place]

## 2016-03-09 NOTE — Assessment & Plan Note (Signed)
New acute problem. History consistent with viral gastritis. Treating with Zofran. If unable to keep down liquids, will need to go to the ER for IV fluids.

## 2016-03-09 NOTE — Progress Notes (Signed)
Pre visit review using our clinic review tool, if applicable. No additional management support is needed unless otherwise documented below in the visit note. 

## 2016-03-09 NOTE — Telephone Encounter (Signed)
Please call pt once filled. Please advise, thank you!  Call pt @ [phone]

## 2016-03-19 ENCOUNTER — Ambulatory Visit: Payer: PPO | Admitting: Family Medicine

## 2016-03-22 ENCOUNTER — Other Ambulatory Visit: Payer: Self-pay | Admitting: Family Medicine

## 2016-03-23 NOTE — Telephone Encounter (Signed)
Last OV 02/21/16 last filled  Flexeril 08/15/15 30 3rf Lovastatin 08/19/15 60 5rf

## 2016-04-12 ENCOUNTER — Telehealth: Payer: Self-pay | Admitting: Family Medicine

## 2016-04-12 NOTE — Telephone Encounter (Signed)
Left pt message asking to call Allison back directly at [phone] to schedule AWV. Thanks! °

## 2016-04-20 ENCOUNTER — Ambulatory Visit (INDEPENDENT_AMBULATORY_CARE_PROVIDER_SITE_OTHER): Payer: PPO | Admitting: Family Medicine

## 2016-04-20 ENCOUNTER — Encounter: Payer: Self-pay | Admitting: Family Medicine

## 2016-04-20 DIAGNOSIS — N3941 Urge incontinence: Secondary | ICD-10-CM

## 2016-04-20 DIAGNOSIS — F3341 Major depressive disorder, recurrent, in partial remission: Secondary | ICD-10-CM

## 2016-04-20 DIAGNOSIS — M79661 Pain in right lower leg: Secondary | ICD-10-CM | POA: Diagnosis not present

## 2016-04-20 MED ORDER — OXYBUTYNIN CHLORIDE ER 5 MG PO TB24: 5.0000 mg | ORAL_TABLET | Freq: Every day | ORAL | 1 refills | Status: DC

## 2016-04-20 NOTE — Progress Notes (Signed)
Pre visit review using our clinic review tool, if applicable. No additional management support is needed unless otherwise documented below in the visit note. 

## 2016-04-20 NOTE — Progress Notes (Signed)
  Tommi Rumps, MD Phone: [phone]  Anne [name] is a 66 y.o. female who presents today for follow-up.  Depression: Patient's currently on Paxil. She notes this is about the same as usual. Does have more difficult time during the winter. No SI or HI.  Urinary urge incontinence: Patient has benefited by the Myrbetriq though it is unaffordable. She urinates 3-4 times a day. The urge to urinate has decreased significantly. She notes no dysuria. She would like to try oxybutynin as it will be more affordable.  Patient notes continued issues with her right calf cramping up and having discomfort when walking. She was supposed to have ABIs previously though these apparently were not scheduled.  PMH: Former smoker   ROS see history of present illness  Objective  Physical Exam Vitals:   04/20/16 0949  BP: 138/86  Pulse: 96  Temp: 97.9 F (36.6 C)    BP Readings from Last 3 Encounters:  04/20/16 138/86  03/09/16 140/84  02/21/16 128/72   Wt Readings from Last 3 Encounters:  04/20/16 214 lb (97.1 kg)  03/09/16 214 lb 6.4 oz (97.3 kg)  02/21/16 215 lb 3.2 oz (97.6 kg)    Physical Exam  Constitutional: No distress.  Cardiovascular: Normal rate, regular rhythm and normal heart sounds.   2+ DP pulses bilaterally, bilateral feet are warm and well-perfused  Pulmonary/Chest: Effort normal and breath sounds normal.  Musculoskeletal: She exhibits no edema.  No calf tenderness bilaterally  Neurological: She is alert. Gait normal.  Skin: She is not diaphoretic.  Psychiatric:  Mood normal today, affect normal     Assessment/Plan: Please see individual problem list.  Urge incontinence of urine Unable to afford Myrbetriq long-term. We'll switch to oxybutynin at her request. I did discuss the potential risks and side effects of this medication with her. She'll let us know if these occur.  Depression Stable. She would like to continue on Paxil. No changes made.  Right calf  pain Only occurs when walking. Concerning for claudication versus muscle cramps. ABIs previously ordered though did not get scheduled. We'll check with our referral coordinator to see if they can get them scheduled. She was advised to contact the office if she has not heard about the ABIs in the next week.  No orders of the defined types were placed in this encounter.   Meds ordered this encounter  Medications  . oxybutynin (DITROPAN-XL) 5 MG 24 hr tablet    Sig: Take 1 tablet (5 mg total) by mouth at bedtime.    Dispense:  90 tablet    Refill:  1   Tommi Rumps, MD [place]

## 2016-04-20 NOTE — Assessment & Plan Note (Signed)
Stable. She would like to continue on Paxil. No changes made.

## 2016-04-20 NOTE — Assessment & Plan Note (Signed)
Only occurs when walking. Concerning for claudication versus muscle cramps. ABIs previously ordered though did not get scheduled. We'll check with our referral coordinator to see if they can get them scheduled.

## 2016-04-20 NOTE — Patient Instructions (Signed)
Nice to see you. Please finish the bottle of Myrbetriq and then the next day started on the oxybutynin. Please monitor for dry mouth, constipation, stomach upset, urinary retention, or any new symptoms. We'll get you scheduled for ABIs as well.

## 2016-04-20 NOTE — Assessment & Plan Note (Signed)
Unable to afford Myrbetriq long-term. We'll switch to oxybutynin at her request. I did discuss the potential risks and side effects of this medication with her. She'll let us know if these occur.

## 2016-05-21 NOTE — Progress Notes (Signed)
The referral coordinator will set up.

## 2016-05-23 ENCOUNTER — Other Ambulatory Visit: Payer: Self-pay | Admitting: Family Medicine

## 2016-05-23 ENCOUNTER — Ambulatory Visit (INDEPENDENT_AMBULATORY_CARE_PROVIDER_SITE_OTHER): Payer: PPO

## 2016-05-23 DIAGNOSIS — M79661 Pain in right lower leg: Secondary | ICD-10-CM | POA: Diagnosis not present

## 2016-05-23 DIAGNOSIS — I739 Peripheral vascular disease, unspecified: Secondary | ICD-10-CM

## 2016-05-30 NOTE — Telephone Encounter (Signed)
Scheduled 06/01/16

## 2016-06-01 ENCOUNTER — Ambulatory Visit (INDEPENDENT_AMBULATORY_CARE_PROVIDER_SITE_OTHER): Payer: PPO

## 2016-06-01 VITALS — BP 130/78 | HR 76 | Temp 97.6°F | Resp 14 | Ht 66.0 in | Wt 214.0 lb

## 2016-06-01 DIAGNOSIS — Z0000 Encounter for general adult medical examination without abnormal findings: Secondary | ICD-10-CM

## 2016-06-01 NOTE — Progress Notes (Signed)
Subjective:   Anne [name] is a 66 y.o. female who presents for an Initial Medicare Annual Wellness Visit.  Review of Systems    No ROS.  Medicare Wellness Visit.  Cardiac Risk Factors include: advanced age (>59mn, >>83women);obesity (BMI >30kg/m2);sedentary lifestyle     Objective:    Today's Vitals   06/01/16 0853  BP: 130/78  Pulse: 76  Resp: 14  Temp: 97.6 F (36.4 C)  TempSrc: Oral  SpO2: 96%  Weight: 214 lb (97.1 kg)  Height: '5\' 6"'$  (1.676 m)   Body mass index is 34.54 kg/m.   Current Medications (verified) Outpatient Encounter Prescriptions as of 06/01/2016  Medication Sig  . Cholecalciferol (VITAMIN D3) 1000 units CAPS Take 1 capsule by mouth daily.  . cyclobenzaprine (FLEXERIL) 10 MG tablet TAKE ONE TABLET BY MOUTH AT BEDTIME  . DHA-Vitamin C-Lutein (EYE HEALTH FORMULA PO) Take by mouth.  . Fish Oil-Cholecalciferol (FISH OIL + D3) 1000-1000 MG-UNIT CAPS Take by mouth.  . lovastatin (MEVACOR) 40 MG tablet TAKE TWO TABLETS BY MOUTH AT BEDTIME  . meloxicam (MOBIC) 15 MG tablet Take 1 tablet (15 mg total) by mouth daily.  .Marland Kitchenoxybutynin (DITROPAN-XL) 5 MG 24 hr tablet Take 1 tablet (5 mg total) by mouth at bedtime.  .Marland KitchenPARoxetine (PAXIL) 20 MG tablet TAKE ONE TABLET BY MOUTH ONCE DAILY  . [place] Johns Wort 150 MG CAPS Take 1 capsule by mouth daily.  .Marland Kitchentriamterene-hydrochlorothiazide (MAXZIDE-25) 37.5-25 MG tablet TAKE ONE TABLET BY MOUTH ONCE DAILY  . albuterol (PROVENTIL HFA;VENTOLIN HFA) 108 (90 Base) MCG/ACT inhaler Inhale 2 puffs into the lungs every 6 (six) hours as needed for wheezing or shortness of breath. (Patient not taking: Reported on 06/01/2016)  . promethazine (PHENERGAN) 12.5 MG tablet Take 1 tablet (12.5 mg total) by mouth every 8 (eight) hours as needed for nausea or vomiting. (Patient not taking: Reported on 06/01/2016)   No facility-administered encounter medications on file as of 06/01/2016.     Allergies (verified) Patient has no known allergies.    History: Past Medical History:  Diagnosis Date  . Arthritis   . Depression   . Hyperlipidemia   . Hypertension    Past Surgical History:  Procedure Laterality Date  . APPENDECTOMY  1971  . CHOLECYSTECTOMY  1971  . TONSILECTOMY, ADENOIDECTOMY, BILATERAL MYRINGOTOMY AND TUBES  1955   Family History  Problem Relation Age of Onset  . Cancer Mother     ovarian  . Hypertension Mother   . Ovarian cancer Mother [phone] . Heart disease Father   . Stroke Father   . Ovarian cancer Sister     ? dx cancer had hyst.  . Breast cancer Neg Hx    Social History   Occupational History  . Not on file.   Social History Main Topics  . Smoking status: Former Smoker    Quit date: 02/07/2012  . Smokeless tobacco: Never Used  . Alcohol use No  . Drug use: No  . Sexual activity: Yes    Tobacco Counseling Counseling given: Not Answered   Activities of Daily Living In your present state of health, do you have any difficulty performing the following activities: 06/01/2016 11/25/2015  Hearing? Y N  Vision? N N  Difficulty concentrating or making decisions? N N  Walking or climbing stairs? Y N  Dressing or bathing? N N  Doing errands, shopping? N N  Preparing Food and eating ? N -  Using the Toilet? N -  In  the past six months, have you accidently leaked urine? N -  Do you have problems with loss of bowel control? N -  Managing your Medications? N -  Managing your Finances? N -  Housekeeping or managing your Housekeeping? N -  Some recent data might be hidden    Immunizations and Health Maintenance Immunization History  Administered Date(s) Administered  . Influenza-Unspecified 10/15/2014, 10/29/2015  . Pneumococcal Conjugate-13 11/13/2013  . Pneumococcal Polysaccharide-23 11/30/2014  . Zoster 11/15/2014   Health Maintenance Due  Topic Date Due  . Hepatitis C Screening  12-12-1950  . COLONOSCOPY  02/17/2010    Patient Care Team: Leone Haven, MD as PCP - General (Family  Medicine)  Indicate any recent Medical Services you may have received from other than [facility] providers in the past year (date may be approximate).     Assessment:   This is a routine wellness examination for Anne [name]. The goal of the wellness visit is to assist the patient how to close the gaps in care and create a preventative care plan for the patient.   Taking calcium VIT D as appropriate/Osteoporosis risk reviewed.  Medications reviewed; taking without issues or barriers.  Safety issues reviewed; smoke detectors in the home. No firearms in the home.  Wears seatbelts when driving or riding with others. Patient does wear sunscreen or protective clothing when in direct sunlight. No violence in the home.  Patient is alert, normal appearance, oriented to person/place/and time. Correctly identified the president of the Anne [name], recall of 3/3 words, and performing simple calculations.  Patient displays appropriate judgement and can read correct time from watch face.  No new identified risk were noted.  No failures at ADL's or IADL's.   BMI- discussed the importance of a healthy diet, water intake and exercise. Educational material provided.   HTN- followed by PCP.  Dental- Upper partial.  Eye- Visual acuity not assessed per patient preference.  Wears corrective lenses when reading.  Sleep patterns- Sleeps 8 hours at night.  Wakes feeling rested.   Hepatitis C Screening discussed, educational material provided.  Colonoscopy declined.  Cologuard discussed, deferred per patient preference.  Educational material provided.  Patient Concerns: None at this time. Follow up with PCP as needed.  Hearing/Vision screen Hearing Screening Comments: Followed by [facility] as needed Hearing aid, bilateral R ear worse than L Vision Screening Comments: Followed by [facility] Wears corrective lenses when reading Cataract extraction, bilateral Visual acuity not assessed per patient  preference  Dietary issues and exercise activities discussed: Current Exercise Habits: The patient does not participate in regular exercise at present  Goals    . Increase physical activity          Stay active with chair exercises and walking as tolerated.      Depression Screen PHQ 2/9 Scores 06/01/2016 02/21/2016 08/15/2015  PHQ - 2 Score 0 0 1  PHQ- 9 Score 0 - 4    Fall Risk Fall Risk  06/01/2016 02/21/2016  Falls in the past year? No No    Cognitive Function:     6CIT Screen 06/01/2016  What Year? 0 points  What month? 0 points  What time? 0 points  Count back from 20 0 points  Months in reverse 0 points  Repeat phrase 0 points  Total Score 0    Screening Tests Health Maintenance  Topic Date Due  . Hepatitis C Screening  09/20/1950  . COLONOSCOPY  02/17/2010  . INFLUENZA VACCINE  09/05/2016  .  MAMMOGRAM  03/08/2017  . TETANUS/TDAP  02/18/2019  . DEXA SCAN  Completed  . PNA vac Low Risk Adult  Completed      Plan:    End of life planning; Advance aging; Advanced directives discussed. Copy of current HCPOA/Living Will requested.    Medicare Attestation I have personally reviewed: The patient's medical and social history Their use of alcohol, tobacco or illicit drugs Their current medications and supplements The patient's functional ability including ADLs,fall risks, home safety risks, cognitive, and hearing and visual impairment Diet and physical activities Evidence for depression   The patient's weight, height, BMI, and visual acuity have been recorded in the chart.  I have made referrals and provided education to the patient based on review of the above and I have provided the patient with a written personalized care plan for preventive services.   I have personally reviewed and noted the following in the patient's chart:   . Medical and social history . Use of alcohol, tobacco or illicit drugs  . Current medications and supplements . Functional  ability and status . Nutritional status . Physical activity . Advanced directives . List of other physicians . Hospitalizations, surgeries, and ER visits in previous 12 months . Vitals . Screenings to include cognitive, depression, and falls . Referrals and appointments  In addition, I have reviewed and discussed with patient certain preventive protocols, quality metrics, and best practice recommendations. A written personalized care plan for preventive services as well as general preventive health recommendations were provided to patient.     Varney Biles, LPN   07/29/7626

## 2016-06-01 NOTE — Patient Instructions (Addendum)
  Ms. Purdon , Thank you for taking time to come for your Medicare Wellness Visit. I appreciate your ongoing commitment to your health goals. Please review the following plan we discussed and let me know if I can assist you in the future.   Follow up with Dr. Caryl Bis as needed.    Have a great day!  These are the goals we discussed: Goals    . Increase physical activity          Stay active with chair exercises and walking as tolerated.       This is a list of the screening recommended for you and due dates:  Health Maintenance  Topic Date Due  .  Hepatitis C: One time screening is recommended by Center for Disease Control  (CDC) for  adults born from [birth year] through 1967.   December 16, 1950  . Colon Cancer Screening  02/17/2010  . Flu Shot  09/05/2016  . Mammogram  03/08/2017  . Tetanus Vaccine  02/18/2019  . DEXA scan (bone density measurement)  Completed  . Pneumonia vaccines  Completed

## 2016-06-01 NOTE — Progress Notes (Signed)
I have reviewed the above note and agree.  Lion Fernandez, M.D.  

## 2016-06-07 ENCOUNTER — Other Ambulatory Visit: Payer: Self-pay | Admitting: Family Medicine

## 2016-06-07 DIAGNOSIS — I739 Peripheral vascular disease, unspecified: Secondary | ICD-10-CM

## 2016-07-06 ENCOUNTER — Other Ambulatory Visit: Payer: Self-pay | Admitting: Nurse Practitioner

## 2016-07-06 NOTE — Telephone Encounter (Signed)
Last filled by Lorane Gell 06/15/15 last OV 04/20/16

## 2016-07-18 ENCOUNTER — Ambulatory Visit: Payer: PPO

## 2016-07-27 ENCOUNTER — Encounter: Payer: Self-pay | Admitting: Family Medicine

## 2016-07-27 ENCOUNTER — Ambulatory Visit (INDEPENDENT_AMBULATORY_CARE_PROVIDER_SITE_OTHER): Payer: PPO | Admitting: Family Medicine

## 2016-07-27 VITALS — BP 160/90 | HR 84 | Temp 97.9°F | Wt 218.4 lb

## 2016-07-27 DIAGNOSIS — I10 Essential (primary) hypertension: Secondary | ICD-10-CM | POA: Diagnosis not present

## 2016-07-27 DIAGNOSIS — L84 Corns and callosities: Secondary | ICD-10-CM | POA: Diagnosis not present

## 2016-07-27 DIAGNOSIS — R011 Cardiac murmur, unspecified: Secondary | ICD-10-CM

## 2016-07-27 DIAGNOSIS — E785 Hyperlipidemia, unspecified: Secondary | ICD-10-CM

## 2016-07-27 MED ORDER — AMLODIPINE BESYLATE 5 MG PO TABS: 5.0000 mg | ORAL_TABLET | Freq: Every day | ORAL | 3 refills | Status: DC

## 2016-07-27 NOTE — Assessment & Plan Note (Signed)
None at goal. We'll start on amlodipine. We'll have her return in one month for recheck and obtain lab work at that time.

## 2016-07-27 NOTE — Assessment & Plan Note (Signed)
Callus noted. Suspect this is causing her discomfort. We'll refer to podiatry for evaluation and treatment.

## 2016-07-27 NOTE — Progress Notes (Signed)
  Tommi Rumps, MD Phone: [phone]  Anne [name] is a 66 y.o. female who presents today for f/u.  HYPERTENSION  Disease Monitoring  Home BP Monitoring elevated  Chest pain- no    Dyspnea- no Medications  Compliance-  Taking maxzide. Lightheadedness-  no  Edema- no  Hyperlipidemia: Taking lovastatin. No right upper quadrant pain or myalgias.  Does note left posterior heel pain. Has been bothering her most of the week. She feels as though it's a callus. She's not done anything for it. Pressing on it and walking hurts.  Notes no history of heart murmur. No lightheadedness.  PMH: former smoker   ROS see history of present illness  Objective  Physical Exam Vitals:   07/27/16 0905  BP: (!) 160/90  Pulse: 84  Temp: 97.9 F (36.6 C)    BP Readings from Last 3 Encounters:  07/27/16 (!) 160/90  06/01/16 130/78  04/20/16 138/86   Wt Readings from Last 3 Encounters:  07/27/16 218 lb 6.4 oz (99.1 kg)  06/01/16 214 lb (97.1 kg)  04/20/16 214 lb (97.1 kg)    Physical Exam  Constitutional: No distress.  Cardiovascular: Normal rate and regular rhythm.  Exam reveals no gallop and no friction rub.   Murmur (2/6 systolic) heard. Pulmonary/Chest: Effort normal and breath sounds normal.  Neurological: She is alert. Gait normal.  Skin: Skin is warm and dry. She is not diaphoretic.  Left posterior medial heel with callus that is tender, no signs of infection     Assessment/Plan: Please see individual problem list.  Benign essential HTN None at goal. We'll start on amlodipine. We'll have her return in one month for recheck and obtain lab work at that time.  HLD (hyperlipidemia) Tolerating lovastatin. We'll plan on checking fasting cholesterol panel in 1 month.  Callus of foot Callus noted. Suspect this is causing her discomfort. We'll refer to podiatry for evaluation and treatment.  Heart murmur Systolic heart murmur noted incidentally on exam. Asymptomatic.  Discussed monitoring for symptoms.   Orders Placed This Encounter  Procedures  . Ambulatory referral to Podiatry    Referral Priority:   Routine    Referral Type:   Consultation    Referral Reason:   Specialty Services Required    Requested Specialty:   Podiatry    Number of Visits Requested:   1    Meds ordered this encounter  Medications  . amLODipine (NORVASC) 5 MG tablet    Sig: Take 1 tablet (5 mg total) by mouth daily.    Dispense:  90 tablet    Refill:  [place], MD [place]

## 2016-07-27 NOTE — Assessment & Plan Note (Signed)
Tolerating lovastatin. We'll plan on checking fasting cholesterol panel in 1 month.

## 2016-07-27 NOTE — Assessment & Plan Note (Signed)
Systolic heart murmur noted incidentally on exam. Asymptomatic. Discussed monitoring for symptoms.

## 2016-07-27 NOTE — Patient Instructions (Addendum)
Nice to see you. We will refer you to podiatry for your foot. We'll start you on amlodipine for your blood pressure. We'll have you return in one month for recheck of blood pressure and to complete fasting lab work.

## 2016-08-02 ENCOUNTER — Other Ambulatory Visit: Payer: Self-pay

## 2016-08-02 MED ORDER — LOVASTATIN 40 MG PO TABS: 80.0000 mg | ORAL_TABLET | Freq: Every day | ORAL | 5 refills | Status: DC

## 2016-08-27 ENCOUNTER — Ambulatory Visit (INDEPENDENT_AMBULATORY_CARE_PROVIDER_SITE_OTHER): Payer: PPO | Admitting: Podiatry

## 2016-08-27 DIAGNOSIS — Q828 Other specified congenital malformations of skin: Secondary | ICD-10-CM | POA: Diagnosis not present

## 2016-08-27 NOTE — Progress Notes (Signed)
Subjective:     Patient ID: Anne [name], female   DOB: Jun 20, 1950, 66 y.o.   MRN: [id number]  HPI this patient presents the office with chief complaint of painful callus on her left heel.  She says the calluses are  painful, causing her to limp and is very painful to walk.. She has applied medicated pads to her heel, but she still experiences  pain.  She is presents the office today for an evaluation and treatment of her painful left heel    Review of Systems  Musculoskeletal: Positive for arthralgias, back pain and gait problem.  Hematological: Bruises/bleeds easily.  All other systems reviewed and are negative.      Objective:   Physical Exam GENERAL APPEARANCE: Alert, conversant. Appropriately groomed. No acute distress.  VASCULAR: Pedal pulses are  palpable at  [facility] and PT bilateral.  Capillary refill time is immediate to all digits,  Normal temperature gradient.  Digital hair growth is present bilateral  NEUROLOGIC: sensation is normal to 5.07 monofilament at 5/5 sites bilateral.  Light touch is intact bilateral, Muscle strength normal.  MUSCULOSKELETAL: acceptable muscle strength, tone and stability bilateral.  Intrinsic muscluature intact bilateral.  Rectus appearance of foot and digits noted bilateral.   DERMATOLOGIC: skin color, texture, and turgor are within normal limits.  No preulcerative lesions or ulcers  are seen, no interdigital maceration noted.  No open lesions present.  Digital nails are asymptomatic. No drainage noted. Multiple porokeratosis noted on the left heel with 1 enlarged callus in the center of the left heel      Assessment:     Porokeratosis  Left heel      Plan:     IE  Debride callus.  RTC 12 weeks. Discussed padding and lotions with patient.   Gardiner Barefoot DPM

## 2016-09-12 ENCOUNTER — Ambulatory Visit (INDEPENDENT_AMBULATORY_CARE_PROVIDER_SITE_OTHER): Payer: PPO | Admitting: Family Medicine

## 2016-09-12 ENCOUNTER — Encounter: Payer: Self-pay | Admitting: Family Medicine

## 2016-09-12 VITALS — BP 138/80 | HR 80 | Temp 97.4°F | Resp 16 | Wt 213.2 lb

## 2016-09-12 DIAGNOSIS — I10 Essential (primary) hypertension: Secondary | ICD-10-CM | POA: Diagnosis not present

## 2016-09-12 DIAGNOSIS — Z1211 Encounter for screening for malignant neoplasm of colon: Secondary | ICD-10-CM | POA: Diagnosis not present

## 2016-09-12 DIAGNOSIS — F3341 Major depressive disorder, recurrent, in partial remission: Secondary | ICD-10-CM

## 2016-09-12 DIAGNOSIS — Z1159 Encounter for screening for other viral diseases: Secondary | ICD-10-CM | POA: Diagnosis not present

## 2016-09-12 DIAGNOSIS — N3941 Urge incontinence: Secondary | ICD-10-CM

## 2016-09-12 LAB — LDL CHOLESTEROL, DIRECT: LDL DIRECT: 114 mg/dL

## 2016-09-12 LAB — COMPREHENSIVE METABOLIC PANEL
ALBUMIN: 4.3 g/dL (ref 3.5–5.2)
ALT: 19 U/L (ref 0–35)
AST: 26 U/L (ref 0–37)
Alkaline Phosphatase: 137 U/L — ABNORMAL HIGH (ref 39–117)
BILIRUBIN TOTAL: 0.6 mg/dL (ref 0.2–1.2)
BUN: 17 mg/dL (ref 6–23)
CALCIUM: 9.7 mg/dL (ref 8.4–10.5)
CO2: 30 meq/L (ref 19–32)
CREATININE: 0.98 mg/dL (ref 0.40–1.20)
Chloride: 99 mEq/L (ref 96–112)
GFR: 59.9 mL/min — ABNORMAL LOW (ref >60.00)
Glucose, Bld: 124 mg/dL — ABNORMAL HIGH (ref 70–99)
Potassium: 3.2 mEq/L — ABNORMAL LOW (ref 3.5–5.1)
SODIUM: 137 meq/L (ref 135–145)
Total Protein: 8.2 g/dL (ref 6.0–8.3)

## 2016-09-12 LAB — LIPID PANEL
CHOLESTEROL: 193 mg/dL (ref 0–200)
HDL: 39.6 mg/dL (ref >39.00)
NONHDL: 153.04
TRIGLYCERIDES: 267 mg/dL — AB (ref 0.0–149.0)
Total CHOL/HDL Ratio: 5
VLDL: 53.4 mg/dL — ABNORMAL HIGH (ref 0.0–40.0)

## 2016-09-12 LAB — HEMOGLOBIN A1C: Hgb A1c MFr Bld: 6.1 % (ref 4.6–6.5)

## 2016-09-12 MED ORDER — AMLODIPINE BESYLATE 10 MG PO TABS: 10.0000 mg | ORAL_TABLET | Freq: Every day | ORAL | 3 refills | Status: DC

## 2016-09-12 NOTE — Assessment & Plan Note (Addendum)
Patient with some depression. Does note some thoughts of being better off not being around though has no plan or intent to harm herself. We discussed that if she were ever to develop a plan or intent to harm herself she needs to go the emergency room. She voiced understanding and advised that she would do this if needed. She will continue Paxil as she does not want to make any changes.

## 2016-09-12 NOTE — Assessment & Plan Note (Addendum)
Not quite at goal. We will increase amlodipine. Continue Maxzide. Lab work as outlined below. She'll contact us in 3-4 weeks if her blood pressure is not consistently less than 130/80.

## 2016-09-12 NOTE — Progress Notes (Signed)
  Tommi Rumps, MD Phone: [phone]  Anne [name] is a 66 y.o. female who presents today for f/u.  HYPERTENSION  Disease Monitoring  Home BP Monitoring typically in the 130s over 80s Chest pain- no    Dyspnea- no Medications  Compliance-  taking amlodipine, Maxzide.   Edema- no  Urge incontinence: Notes this is really well controlled now on oxybutynin. She notes no urgency or leakage.  Depression: Notes some depression. Some days not good. Other days are fine. Is currently on Paxil. She does note there are some days where she feels as though she would be better off not being alive though she has no plan or intent to harm herself. She notes she has her husband to live for and her dogs. She's not interested in changing her medication.  PMH: Former smoker   ROS see history of present illness  Objective  Physical Exam Vitals:   09/12/16 0805  BP: 138/80  Pulse: 80  Resp: 16  Temp: (!) 97.4 F (36.3 C)    BP Readings from Last 3 Encounters:  09/12/16 138/80  07/27/16 (!) 160/90  06/01/16 130/78   Wt Readings from Last 3 Encounters:  09/12/16 213 lb 3.2 oz (96.7 kg)  07/27/16 218 lb 6.4 oz (99.1 kg)  06/01/16 214 lb (97.1 kg)    Physical Exam  Constitutional: No distress.  Cardiovascular: Normal rate, regular rhythm and normal heart sounds.   Pulmonary/Chest: Effort normal and breath sounds normal.  Abdominal: Soft. Bowel sounds are normal. She exhibits no distension. There is no tenderness.  Musculoskeletal: She exhibits no edema.  Neurological: She is alert. Gait normal.  Skin: She is not diaphoretic.     Assessment/Plan: Please see individual problem list.  Benign essential HTN Not quite at goal. We will increase amlodipine. Continue Maxzide. Lab work as outlined below. She'll contact us in 3-4 weeks if her blood pressure is not consistently less than 130/80.  Depression Patient with some depression. Does note some thoughts of being better off not  being around though has no plan or intent to harm herself. We discussed that if she were ever to develop a plan or intent to harm herself she needs to go the emergency room. She voiced understanding and advised that she would do this if needed. She will continue Paxil as she does not want to make any changes.  Urge incontinence of urine Doing well on oxybutynin.   Orders Placed This Encounter  Procedures  . Comp Met (CMET)  . Lipid panel  . HgB A1c  . Hepatitis C Antibody  . Ambulatory referral to Gastroenterology    Referral Priority:   Routine    Referral Type:   Consultation    Referral Reason:   Specialty Services Required    Number of Visits Requested:   1    Meds ordered this encounter  Medications  . amLODipine (NORVASC) 10 MG tablet    Sig: Take 1 tablet (10 mg total) by mouth daily.    Dispense:  90 tablet    Refill:  3    # Healthcare maintenance: Refer to GI for colonoscopy. Check hepatitis C antibody.   Tommi Rumps, MD [place]

## 2016-09-12 NOTE — Assessment & Plan Note (Signed)
Doing well on oxybutynin.

## 2016-09-12 NOTE — Patient Instructions (Signed)
Nice to see you. We are going to increase your amlodipine to 10 mg daily. You may take two 5 mg tablets until you run out of your current prescription. We'll check lab work today and contact you with results. We'll get you set up for colonoscopy.  Please call us in 3-4 weeks if your blood pressure is not consistently less than 130/80.

## 2016-09-13 ENCOUNTER — Other Ambulatory Visit: Payer: Self-pay | Admitting: Family Medicine

## 2016-09-13 LAB — HEPATITIS C ANTIBODY: HCV Ab: NONREACTIVE

## 2016-09-13 MED ORDER — POTASSIUM CHLORIDE CRYS ER 20 MEQ PO TBCR: 40.0000 meq | EXTENDED_RELEASE_TABLET | Freq: Every day | ORAL | 0 refills | Status: DC

## 2016-09-14 ENCOUNTER — Other Ambulatory Visit: Payer: Self-pay | Admitting: Family Medicine

## 2016-09-14 DIAGNOSIS — E785 Hyperlipidemia, unspecified: Secondary | ICD-10-CM

## 2016-09-14 MED ORDER — ROSUVASTATIN CALCIUM 20 MG PO TABS: 20.0000 mg | ORAL_TABLET | Freq: Every day | ORAL | 3 refills | Status: DC

## 2016-09-17 ENCOUNTER — Telehealth: Payer: Self-pay | Admitting: Family Medicine

## 2016-09-17 NOTE — Telephone Encounter (Signed)
Patient notified to only take the new rx for cholesterol and to discontinue the old

## 2016-09-17 NOTE — Telephone Encounter (Signed)
Pt called about medication rosuvastatin (CRESTOR) 20 MG tablet and lovastatin wants to know should she take both?  Call pt @ [phone]. Thank you!

## 2016-10-12 ENCOUNTER — Other Ambulatory Visit (INDEPENDENT_AMBULATORY_CARE_PROVIDER_SITE_OTHER): Payer: PPO

## 2016-10-12 DIAGNOSIS — E785 Hyperlipidemia, unspecified: Secondary | ICD-10-CM | POA: Diagnosis not present

## 2016-10-12 LAB — HEPATIC FUNCTION PANEL
ALBUMIN: 4.4 g/dL (ref 3.5–5.2)
ALT: 14 U/L (ref 0–35)
AST: 23 U/L (ref 0–37)
Alkaline Phosphatase: 145 U/L — ABNORMAL HIGH (ref 39–117)
BILIRUBIN TOTAL: 0.8 mg/dL (ref 0.2–1.2)
Bilirubin, Direct: 0.2 mg/dL (ref 0.0–0.3)
Total Protein: 8 g/dL (ref 6.0–8.3)

## 2016-10-12 LAB — LDL CHOLESTEROL, DIRECT: LDL DIRECT: 69 mg/dL

## 2016-10-16 ENCOUNTER — Other Ambulatory Visit: Payer: Self-pay | Admitting: Family Medicine

## 2016-10-16 DIAGNOSIS — R748 Abnormal levels of other serum enzymes: Secondary | ICD-10-CM

## 2016-10-17 ENCOUNTER — Telehealth: Payer: Self-pay | Admitting: Family

## 2016-10-17 ENCOUNTER — Ambulatory Visit (INDEPENDENT_AMBULATORY_CARE_PROVIDER_SITE_OTHER): Payer: PPO

## 2016-10-17 ENCOUNTER — Ambulatory Visit (INDEPENDENT_AMBULATORY_CARE_PROVIDER_SITE_OTHER): Payer: PPO | Admitting: Family

## 2016-10-17 ENCOUNTER — Other Ambulatory Visit (INDEPENDENT_AMBULATORY_CARE_PROVIDER_SITE_OTHER): Payer: PPO

## 2016-10-17 ENCOUNTER — Encounter: Payer: Self-pay | Admitting: Family

## 2016-10-17 VITALS — BP 144/66 | HR 86 | Temp 98.4°F | Ht 66.0 in | Wt 204.0 lb

## 2016-10-17 DIAGNOSIS — M25571 Pain in right ankle and joints of right foot: Secondary | ICD-10-CM | POA: Diagnosis not present

## 2016-10-17 DIAGNOSIS — M7989 Other specified soft tissue disorders: Secondary | ICD-10-CM | POA: Diagnosis not present

## 2016-10-17 DIAGNOSIS — M79671 Pain in right foot: Secondary | ICD-10-CM | POA: Diagnosis not present

## 2016-10-17 LAB — URIC ACID: Uric Acid, Serum: 7.4 mg/dL — ABNORMAL HIGH (ref 2.4–7.0)

## 2016-10-17 LAB — CBC WITH DIFFERENTIAL/PLATELET
BASOS PCT: 1 % (ref 0.0–3.0)
Basophils Absolute: 0.1 10*3/uL (ref 0.0–0.1)
EOS ABS: 0.2 10*3/uL (ref 0.0–0.7)
EOS PCT: 1.9 % (ref 0.0–5.0)
HEMATOCRIT: 40 % (ref 36.0–46.0)
HEMOGLOBIN: 13.3 g/dL (ref 12.0–15.0)
Lymphocytes Relative: 18.4 % (ref 12.0–46.0)
Lymphs Abs: 1.9 10*3/uL (ref 0.7–4.0)
MCHC: 33.2 g/dL (ref 30.0–36.0)
MCV: 84.7 fl (ref 78.0–100.0)
Monocytes Absolute: 0.7 10*3/uL (ref 0.1–1.0)
Monocytes Relative: 7.4 % (ref 3.0–12.0)
Neutro Abs: 7.2 10*3/uL (ref 1.4–7.7)
Neutrophils Relative %: 71.3 % (ref 43.0–77.0)
Platelets: 250 10*3/uL (ref 150.0–400.0)
RBC: 4.72 Mil/uL (ref 3.87–5.11)
RDW: 15.2 % (ref 11.5–15.5)
WBC: 10.1 10*3/uL (ref 4.0–10.5)

## 2016-10-17 LAB — SEDIMENTATION RATE: Sed Rate: 59 mm/hr — ABNORMAL HIGH (ref 0–30)

## 2016-10-17 LAB — C-REACTIVE PROTEIN: CRP: 5.3 mg/dL (ref 0.5–20.0)

## 2016-10-17 MED ORDER — NAPROXEN 500 MG PO TABS: 500.0000 mg | ORAL_TABLET | Freq: Two times a day (BID) | ORAL | 0 refills | Status: DC

## 2016-10-17 NOTE — Progress Notes (Signed)
Subjective:    Patient ID: Anne [name], female    DOB: [birth year], 66 y.o.   MRN: [id number]  CC: [name] is a 66 y.o. female who presents today for an acute visit.    HPI: Right ankle pain started 3 days ago, worsening.   No falls. No tick bites, insects.   Uses cane at home. Worse when stands. Okay when sitting.  Tried mobic without relief.   No fever, chills, N, v    No DM.  No ckd, no h/o gout.  HISTORY:  Past Medical History:  Diagnosis Date  . Arthritis   . Depression   . Hyperlipidemia   . Hypertension    Past Surgical History:  Procedure Laterality Date  . APPENDECTOMY  1971  . CHOLECYSTECTOMY  1971  . TONSILECTOMY, ADENOIDECTOMY, BILATERAL MYRINGOTOMY AND TUBES  1955   Family History  Problem Relation Age of Onset  . Cancer Mother        ovarian  . Hypertension Mother   . Ovarian cancer Mother 37  . Heart disease Father   . Stroke Father   . Ovarian cancer Sister        ? dx cancer had hyst.  . Breast cancer Neg Hx     Allergies: Patient has no known allergies. Current Outpatient Prescriptions on File Prior to Visit  Medication Sig Dispense Refill  . albuterol (PROVENTIL HFA;VENTOLIN HFA) 108 (90 Base) MCG/ACT inhaler Inhale 2 puffs into the lungs every 6 (six) hours as needed for wheezing or shortness of breath. 1 Inhaler 2  . amLODipine (NORVASC) 10 MG tablet Take 1 tablet (10 mg total) by mouth daily. 90 tablet 3  . Cholecalciferol (VITAMIN D3) 1000 units CAPS Take 1 capsule by mouth daily.    . cyclobenzaprine (FLEXERIL) 10 MG tablet TAKE ONE TABLET BY MOUTH AT BEDTIME 30 tablet 3  . DHA-Vitamin C-Lutein (EYE HEALTH FORMULA PO) Take by mouth.    . Fish Oil-Cholecalciferol (FISH OIL + D3) 1000-1000 MG-UNIT CAPS Take by mouth.    . meloxicam (MOBIC) 15 MG tablet Take 1 tablet (15 mg total) by mouth daily. 30 tablet 1  . oxybutynin (DITROPAN-XL) 5 MG 24 hr tablet Take 1 tablet (5 mg total) by mouth at bedtime. 90 tablet 1  . PARoxetine  (PAXIL) 20 MG tablet TAKE ONE TABLET BY MOUTH ONCE DAILY 90 tablet 2  . potassium chloride SA (K-DUR,KLOR-CON) 20 MEQ tablet Take 2 tablets (40 mEq total) by mouth daily. 6 tablet 0  . promethazine (PHENERGAN) 12.5 MG tablet Take 1 tablet (12.5 mg total) by mouth every 8 (eight) hours as needed for nausea or vomiting. 20 tablet 0  . rosuvastatin (CRESTOR) 20 MG tablet Take 1 tablet (20 mg total) by mouth daily. 90 tablet 3  . St Johns Wort 150 MG CAPS Take 1 capsule by mouth daily.    Marland Kitchen triamterene-hydrochlorothiazide (MAXZIDE-25) 37.5-25 MG tablet TAKE ONE TABLET BY MOUTH ONCE DAILY 30 tablet 11   No current facility-administered medications on file prior to visit.     Social History  Substance Use Topics  . Smoking status: Former Smoker    Quit date: 02/07/2012  . Smokeless tobacco: Never Used  . Alcohol use No    Review of Systems  Constitutional: Negative for chills and fever.  Respiratory: Negative for cough.   Cardiovascular: Negative for chest pain and palpitations.  Gastrointestinal: Negative for nausea and vomiting.  Skin: Negative for wound.      Objective:  BP (!) 144/66   Pulse 86   Temp 98.4 F (36.9 C) (Oral)   Ht 5\' 6"  (1.676 m)   Wt 204 lb (92.5 kg)   SpO2 94%   BMI 32.93 kg/m    Physical Exam  Constitutional: She appears well-developed and well-nourished.  Eyes: Conjunctivae are normal.  Cardiovascular: Normal rate, regular rhythm, normal heart sounds and normal pulses.   Pulmonary/Chest: Effort normal and breath sounds normal. She has no wheezes. She has no rhonchi. She has no rales.  Musculoskeletal:       Right ankle: She exhibits swelling. She exhibits normal range of motion and no ecchymosis. No tenderness. No lateral malleolus and no medial malleolus tenderness found.       Left foot: There is tenderness and swelling. There is normal range of motion, no bony tenderness, no deformity and no laceration.       Feet:  Edema is a per diagram.  Nonpitting. Mild erythema across dorsal aspect of right foot. No wound, laceration.Slightly increase in warmth. Diffuse tenderness. Mild podagra  Neurological: She is alert.  Skin: Skin is warm and dry.  Psychiatric: She has a normal mood and affect. Her speech is normal and behavior is normal. Thought content normal.  Vitals reviewed.      Assessment & Plan:  1. Acute right ankle pain Patient is well-appearing with no systemic features. Working diagnoses include cellulitis versus gouty arthritis. X-ray raises suspicion for gouty arthritis however due to diffuse ( v local) presentation of symptoms, will treat empirically for both infection, and gout. Patient will start doxycycline and will also take NSAID. Pending lab studies , including uric acid today and also when no having acute symptoms.    - C-reactive protein; Future - Sedimentation rate; Future - CBC with Differential/Platelet; Future - DG Ankle Complete Right - DG Foot Complete Right - C-reactive protein - Sedimentation rate - CBC with Differential/Platelet     I am having Ms. Lennon maintain her DHA-Vitamin C-Lutein (EYE HEALTH FORMULA PO), FISH OIL + D3, St Johns Wort, Vitamin D3, meloxicam, albuterol, PARoxetine, promethazine, cyclobenzaprine, oxybutynin, triamterene-hydrochlorothiazide, amLODipine, potassium chloride SA, and rosuvastatin.   No orders of the defined types were placed in this encounter.   Return precautions given.   Risks, benefits, and alternatives of the medications and treatment plan prescribed today were discussed, and patient expressed understanding.   Education regarding symptom management and diagnosis given to patient on AVS.  Continue to follow with Leone Haven, MD for routine health maintenance.   Vear Clock and I agreed with plan.   Mable Paris, FNP

## 2016-10-17 NOTE — Telephone Encounter (Signed)
Please call pt  Concern for gout based on xrays of right great toe and erosions seen. Also notes heel spurs.  Start naprosyn as prescribed to [facility]. STOP mobic at this time.  Lets continue doxycycline to cover for any skin infection. As symptoms hopefully improve, may be able to stop antibiotic. Have her call us and let us know how she is doing.  Ensure to take probiotics while on antibiotics and also for 2 weeks after completion. It is important to re-colonize the gut with good bacteria and also to prevent any diarrheal infections associated with antibiotic use.   Also ask her to come back in a couple of weeks for uric acid lab. Uric acid isnt really accurate during acute flare.

## 2016-10-17 NOTE — Patient Instructions (Signed)
Will treat empirically for infection. Please watch for any new or worsening signs including redness, fever, swelling, pain. may use ibuprofen as needed for pain. Elevate foot and apply ice.  as discussed, it also may be gout. Gout is treated with ibuprofen and sometimes prednisone. After an acute gout flare, you may come back and have labs drawn to look for uric acid;  if this elevated which may indicate that this flare was in fact gout.  X-rays today.  If there is no improvement in your symptoms, or if there is any worsening of symptoms, or if you have any additional concerns, please return for re-evaluation as infection may be worsening; or, if we are closed, consider going to the Emergency Room for evaluation if symptoms urgent.   Cellulitis, Adult Cellulitis is a skin infection. The infected area is usually red and sore. This condition occurs most often in the arms and lower legs. It is very important to get treated for this condition. Follow these instructions at home:  Take over-the-counter and prescription medicines only as told by your doctor.  If you were prescribed an antibiotic medicine, take it as told by your doctor. Do not stop taking the antibiotic even if you start to feel better.  Drink enough fluid to keep your pee (urine) clear or pale yellow.  Do not touch or rub the infected area.  Raise (elevate) the infected area above the level of your heart while you are sitting or lying down.  Place warm or cold wet cloths (warm or cold compresses) on the infected area. Do this as told by your doctor.  Keep all follow-up visits as told by your doctor. This is important. These visits let your doctor make sure your infection is not getting worse. Contact a doctor if:  You have a fever.  Your symptoms do not get better after 1-2 days of treatment.  Your bone or joint under the infected area starts to hurt after the skin has healed.  Your infection comes back. This can happen  in the same area or another area.  You have a swollen bump in the infected area.  You have new symptoms.  You feel ill and also have muscle aches and pains. Get help right away if:  Your symptoms get worse.  You feel very sleepy.  You throw up (vomit) or have watery poop (diarrhea) for a long time.  There are red streaks coming from the infected area.  Your red area gets larger.  Your red area turns darker. This information is not intended to replace advice given to you by your health care provider. Make sure you discuss any questions you have with your health care provider. Document Released: 07/11/2007 Document Revised: 06/30/2015 Document Reviewed: 12/01/2014 Elsevier Interactive Patient Education  2018 [employer].

## 2016-10-17 NOTE — Addendum Note (Signed)
Addended by: Leeanne Rio on: 10/17/2016 12:41 PM   Modules accepted: Orders

## 2016-10-17 NOTE — Telephone Encounter (Signed)
Patient was informed of results.  Patient understood and no questions, comments, or concerns at this time.  

## 2016-10-30 ENCOUNTER — Other Ambulatory Visit: Payer: Self-pay

## 2016-10-30 DIAGNOSIS — Z1211 Encounter for screening for malignant neoplasm of colon: Secondary | ICD-10-CM

## 2016-10-30 DIAGNOSIS — Z1212 Encounter for screening for malignant neoplasm of rectum: Principal | ICD-10-CM

## 2016-11-02 ENCOUNTER — Other Ambulatory Visit: Payer: Self-pay | Admitting: Family Medicine

## 2016-11-06 ENCOUNTER — Other Ambulatory Visit: Payer: Self-pay

## 2016-11-06 MED ORDER — PEG 3350-KCL-NA BICARB-NACL 420 G PO SOLR: 4000.0000 mL | Freq: Once | ORAL | 0 refills | Status: AC

## 2016-11-15 ENCOUNTER — Ambulatory Visit: Payer: PPO | Admitting: Anesthesiology

## 2016-11-15 ENCOUNTER — Encounter
Admission: RE | Disposition: A | Discharge status: Home / Self Care | Payer: Self-pay | Source: Ambulatory Visit | Attending: Gastroenterology

## 2016-11-15 ENCOUNTER — Ambulatory Visit
Admission: RE | Admit: 2016-11-15 | Discharge: 2016-11-15 | Disposition: A | Discharge status: Home / Self Care | Payer: PPO | Source: Ambulatory Visit | Attending: Gastroenterology | Admitting: Gastroenterology

## 2016-11-15 DIAGNOSIS — Z1211 Encounter for screening for malignant neoplasm of colon: Secondary | ICD-10-CM | POA: Insufficient documentation

## 2016-11-15 DIAGNOSIS — Z86010 Personal history of colonic polyps: Secondary | ICD-10-CM | POA: Insufficient documentation

## 2016-11-15 DIAGNOSIS — K640 First degree hemorrhoids: Secondary | ICD-10-CM | POA: Diagnosis not present

## 2016-11-15 DIAGNOSIS — Z1212 Encounter for screening for malignant neoplasm of rectum: Secondary | ICD-10-CM

## 2016-11-15 DIAGNOSIS — D122 Benign neoplasm of ascending colon: Secondary | ICD-10-CM | POA: Diagnosis not present

## 2016-11-15 DIAGNOSIS — F329 Major depressive disorder, single episode, unspecified: Secondary | ICD-10-CM | POA: Insufficient documentation

## 2016-11-15 DIAGNOSIS — I10 Essential (primary) hypertension: Secondary | ICD-10-CM | POA: Insufficient documentation

## 2016-11-15 DIAGNOSIS — Z79899 Other long term (current) drug therapy: Secondary | ICD-10-CM | POA: Insufficient documentation

## 2016-11-15 DIAGNOSIS — D123 Benign neoplasm of transverse colon: Secondary | ICD-10-CM | POA: Diagnosis not present

## 2016-11-15 DIAGNOSIS — E785 Hyperlipidemia, unspecified: Secondary | ICD-10-CM | POA: Diagnosis not present

## 2016-11-15 DIAGNOSIS — Z791 Long term (current) use of non-steroidal anti-inflammatories (NSAID): Secondary | ICD-10-CM | POA: Diagnosis not present

## 2016-11-15 DIAGNOSIS — K648 Other hemorrhoids: Secondary | ICD-10-CM | POA: Diagnosis not present

## 2016-11-15 DIAGNOSIS — Z87891 Personal history of nicotine dependence: Secondary | ICD-10-CM | POA: Diagnosis not present

## 2016-11-15 DIAGNOSIS — K635 Polyp of colon: Secondary | ICD-10-CM | POA: Diagnosis not present

## 2016-11-15 HISTORY — PX: COLONOSCOPY WITH PROPOFOL: SHX5780

## 2016-11-15 SURGERY — COLONOSCOPY WITH PROPOFOL
Anesthesia: General

## 2016-11-15 MED ORDER — PROPOFOL 500 MG/50ML IV EMUL
INTRAVENOUS | Status: DC | PRN
  Administered 2016-11-15: 200 ug/kg/min via INTRAVENOUS

## 2016-11-15 MED ORDER — PHENYLEPHRINE HCL 10 MG/ML IJ SOLN
INTRAMUSCULAR | Status: DC | PRN
  Administered 2016-11-15: 50 ug via INTRAVENOUS

## 2016-11-15 MED ORDER — PROPOFOL 10 MG/ML IV BOLUS
INTRAVENOUS | Status: DC | PRN
  Administered 2016-11-15: 70 mg via INTRAVENOUS
  Administered 2016-11-15: 30 mg via INTRAVENOUS

## 2016-11-15 MED ORDER — SODIUM CHLORIDE 0.9 % IV SOLN
INTRAVENOUS | Status: DC
  Administered 2016-11-15: 1000 mL via INTRAVENOUS

## 2016-11-15 MED ORDER — PROPOFOL 500 MG/50ML IV EMUL
INTRAVENOUS | Status: AC
  Filled 2016-11-15: qty 50

## 2016-11-15 MED ORDER — LIDOCAINE 2% (20 MG/ML) 5 ML SYRINGE
INTRAMUSCULAR | Status: DC | PRN
  Administered 2016-11-15: 50 mg via INTRAVENOUS

## 2016-11-15 NOTE — Op Note (Signed)
[facility] Patient Name: Anne [name] Procedure Date: 11/15/2016 9:57 AM MRN: [id number] Account #: [id number] Date of Birth: 1950-06-10 Admit Type: Outpatient Age: 66 Room: [facility] ENDO ROOM 1 Gender: Female Note Status: Finalized Procedure:            Colonoscopy Indications:          High risk colon cancer surveillance: Personal history                        of colonic polyps Providers:            Jonathon Bellows MD, MD Medicines:            Monitored Anesthesia Care Complications:        No immediate complications. Procedure:            Pre-Anesthesia Assessment:                       - Prior to the procedure, a History and Physical was                        performed, and patient medications, allergies and                        sensitivities were reviewed. The patient's tolerance of                        previous anesthesia was reviewed.                       - The risks and benefits of the procedure and the                        sedation options and risks were discussed with the                        patient. All questions were answered and informed                        consent was obtained.                       - ASA Grade Assessment: III - A patient with severe                        systemic disease.                       After obtaining informed consent, the colonoscope was                        passed under direct vision. Throughout the procedure,                        the patient's blood pressure, pulse, and oxygen                        saturations were monitored continuously. The                        Colonoscope was introduced through the anus and  advanced to the the cecum, identified by the                        appendiceal orifice, IC valve and transillumination.                        The colonoscopy was performed with ease. The                        colonoscopy was performed with moderate  difficulty due                        to a tortuous colon. Findings:      A 3 mm polyp was found in the ascending colon. The polyp was sessile.       The polyp was removed with a cold biopsy forceps. Resection and       retrieval were complete.      A 7 mm polyp was found in the transverse colon. The polyp was sessile.       The polyp was removed with a cold snare. Resection and retrieval were       complete.      Non-bleeding internal hemorrhoids were found during retroflexion. The       hemorrhoids were medium-sized and Grade I (internal hemorrhoids that do       not prolapse).      A 5 mm polyp was found in the descending colon. The polyp was sessile.       The polyp was removed with a cold biopsy forceps. Resection and       retrieval were complete. Impression:           - One 3 mm polyp in the ascending colon, removed with a                        cold biopsy forceps. Resected and retrieved.                       - One 7 mm polyp in the transverse colon, removed with                        a cold snare. Resected and retrieved.                       - Non-bleeding internal hemorrhoids.                       - One 5 mm polyp in the descending colon, removed with                        a cold biopsy forceps. Resected and retrieved. Recommendation:       - Discharge patient to home (with escort).                       - Resume previous diet.                       - Continue present medications.                       - Await pathology results.                       -  Repeat colonoscopy in 3 - 5 years for surveillance                        based on pathology results. Procedure Code(s):    --- Professional ---                       [id number], Colonoscopy, flexible; with removal of tumor(s),                        polyp(s), or other lesion(s) by snare technique                       45380, 1, Colonoscopy, flexible; with biopsy, single                        or multiple Diagnosis Code(s):     --- Professional ---                       Z86.010, Personal history of colonic polyps                       D12.2, Benign neoplasm of ascending colon                       D12.3, Benign neoplasm of transverse colon (hepatic                        flexure or splenic flexure)                       D12.4, Benign neoplasm of descending colon                       K64.0, First degree hemorrhoids CPT copyright 2016 American Medical Association. All rights reserved. The codes documented in this report are preliminary and upon coder review may  be revised to meet current compliance requirements. Jonathon Bellows, MD Jonathon Bellows MD, MD 11/15/2016 10:47:30 AM This report has been signed electronically. Number of Addenda: 0 Note Initiated On: 11/15/2016 9:57 AM Scope Withdrawal Time: 0 hours 11 minutes 48 seconds  Total Procedure Duration: 0 hours 37 minutes 12 seconds       [facility]

## 2016-11-15 NOTE — Transfer of Care (Signed)
Immediate Anesthesia Transfer of Care Note  Patient: Anne [name]  Procedure(s) Performed: COLONOSCOPY WITH PROPOFOL (N/A )  Patient Location: Endoscopy Unit  Anesthesia Type:General  Level of Consciousness: sedated  Airway & Oxygen Therapy: Patient connected to nasal cannula oxygen  Post-op Assessment: Post -op Vital signs reviewed and stable  Post vital signs: stable  Last Vitals:  Vitals:   11/15/16 0908 11/15/16 1048  BP: 122/70 110/60  Pulse: 73 65  Resp: 16   Temp: (!) 35.8 C (!) 36 C  SpO2: 100% 98%    Last Pain:  Vitals:   11/15/16 1048  TempSrc: Tympanic         Complications: No apparent anesthesia complications

## 2016-11-15 NOTE — Anesthesia Postprocedure Evaluation (Signed)
Anesthesia Post Note  Patient: Anne [name]  Procedure(s) Performed: COLONOSCOPY WITH PROPOFOL (N/A )  Patient location during evaluation: PACU Anesthesia Type: General Level of consciousness: awake and alert Pain management: pain level controlled Vital Signs Assessment: post-procedure vital signs reviewed and stable Respiratory status: spontaneous breathing, nonlabored ventilation, respiratory function stable and patient connected to nasal cannula oxygen Cardiovascular status: blood pressure returned to baseline and stable Postop Assessment: no apparent nausea or vomiting Anesthetic complications: no     Last Vitals:  Vitals:   11/15/16 1100 11/15/16 1110  BP: 110/72 110/72  Pulse: 66 65  Resp: 15 17  Temp:    SpO2: 100% 100%    Last Pain:  Vitals:   11/15/16 1048  TempSrc: Tympanic                 [name]

## 2016-11-15 NOTE — Anesthesia Preprocedure Evaluation (Signed)
Anesthesia Evaluation  Patient identified by MRN, date of birth, ID band Patient awake    Reviewed: Allergy & Precautions, H&P , NPO status , Patient's Chart, lab work & pertinent test results, reviewed documented beta blocker date and time   Airway Mallampati: II   Neck ROM: full    Dental  (+) Poor Dentition   Pulmonary neg pulmonary ROS, former smoker,    Pulmonary exam normal        Cardiovascular Exercise Tolerance: Poor hypertension, On Medications negative cardio ROS Normal cardiovascular exam+ Valvular Problems/Murmurs  Rhythm:regular Rate:Normal     Neuro/Psych PSYCHIATRIC DISORDERS negative neurological ROS  negative psych ROS   GI/Hepatic negative GI ROS, Neg liver ROS,   Endo/Other  negative endocrine ROS  Renal/GU negative Renal ROS  negative genitourinary   Musculoskeletal   Abdominal   Peds  Hematology negative hematology ROS (+)   Anesthesia Other Findings Past Medical History: No date: Arthritis No date: Depression No date: Hyperlipidemia No date: Hypertension Past Surgical History: 1971: APPENDECTOMY 1971: CHOLECYSTECTOMY 1955: TONSILECTOMY, ADENOIDECTOMY, BILATERAL MYRINGOTOMY AND TUBES BMI    Body Mass Index:  33.89 kg/m     Reproductive/Obstetrics negative OB ROS                             Anesthesia Physical Anesthesia Plan  ASA: III  Anesthesia Plan: General   Post-op Pain Management:    Induction:   PONV Risk Score and Plan:   Airway Management Planned:   Additional Equipment:   Intra-op Plan:   Post-operative Plan:   Informed Consent: I have reviewed the patients History and Physical, chart, labs and discussed the procedure including the risks, benefits and alternatives for the proposed anesthesia with the patient or authorized representative who has indicated his/her understanding and acceptance.   Dental Advisory Given  Plan  Discussed with: CRNA  Anesthesia Plan Comments:         Anesthesia Quick Evaluation

## 2016-11-15 NOTE — H&P (Signed)
Jonathon Bellows MD [address] [place], [place] [address] Phone: [phone] Fax : [phone]  Primary Care Physician:  Leone Haven, MD Primary Gastroenterologist:  Dr. Jonathon Bellows   Pre-Procedure History & Physical: HPI:  Anne [name] is a 66 y.o. female is here for an colonoscopy.   Past Medical History:  Diagnosis Date  . Arthritis   . Depression   . Hyperlipidemia   . Hypertension     Past Surgical History:  Procedure Laterality Date  . APPENDECTOMY  1971  . CHOLECYSTECTOMY  1971  . TONSILECTOMY, ADENOIDECTOMY, BILATERAL MYRINGOTOMY AND TUBES  1955    Prior to Admission medications   Medication Sig Start Date End Date Taking? Authorizing Provider  albuterol (PROVENTIL HFA;VENTOLIN HFA) 108 (90 Base) MCG/ACT inhaler Inhale 2 puffs into the lungs every 6 (six) hours as needed for wheezing or shortness of breath. 11/27/15  Yes Demetrios Loll, MD  amLODipine (NORVASC) 10 MG tablet Take 1 tablet (10 mg total) by mouth daily. 09/12/16  Yes Leone Haven, MD  cyclobenzaprine (FLEXERIL) 10 MG tablet TAKE ONE TABLET BY MOUTH AT BEDTIME 03/23/16  Yes Leone Haven, MD  DHA-Vitamin C-Lutein (EYE HEALTH FORMULA PO) Take by mouth.   Yes [provider]  Fish Oil-Cholecalciferol (FISH OIL + D3) 1000-1000 MG-UNIT CAPS Take by mouth.   Yes [provider]  KLOR-CON M20 20 MEQ tablet  09/13/16  Yes [provider]  meloxicam (MOBIC) 15 MG tablet Take 1 tablet (15 mg total) by mouth daily. 04/21/15  Yes Doss, Velora Heckler, RN  naproxen (NAPROSYN) 500 MG tablet Take 1 tablet (500 mg total) by mouth 2 (two) times daily with a meal. 10/17/16  Yes Burnard Hawthorne, FNP  oxybutynin (DITROPAN-XL) 5 MG 24 hr tablet TAKE 1 TABLET BY MOUTH AT BEDTIME 11/02/16  Yes Leone Haven, MD  PARoxetine (PAXIL) 20 MG tablet TAKE ONE TABLET BY MOUTH ONCE DAILY 02/08/16  Yes Leone Haven, MD  promethazine (PHENERGAN) 12.5 MG tablet Take 1 tablet (12.5 mg total)  by mouth every 8 (eight) hours as needed for nausea or vomiting. 03/09/16  Yes Cook, Jayce G, DO  rosuvastatin (CRESTOR) 20 MG tablet Take 1 tablet (20 mg total) by mouth daily. 09/14/16  Yes Leone Haven, MD  [facility] Wort 150 MG CAPS Take 1 capsule by mouth daily.   Yes [provider]  triamterene-hydrochlorothiazide (MAXZIDE-25) 37.5-25 MG tablet TAKE ONE TABLET BY MOUTH ONCE DAILY Patient not taking: Reported on 11/15/2016 07/06/16   Leone Haven, MD    Allergies as of 10/30/2016  . (No Known Allergies)    Family History  Problem Relation Age of Onset  . Cancer Mother        ovarian  . Hypertension Mother   . Ovarian cancer Mother 28  . Heart disease Father   . Stroke Father   . Ovarian cancer Sister        ? dx cancer had hyst.  . Breast cancer Neg Hx     Social History   Social History  . Marital status: Married    Spouse name: N/A  . Number of children: N/A  . Years of education: N/A   Occupational History  . Not on file.   Social History Main Topics  . Smoking status: Former Smoker    Quit date: 02/07/2012  . Smokeless tobacco: Never Used  . Alcohol use No  . Drug use: No  . Sexual activity: Yes  Other Topics Concern  . Not on file   Social History Narrative   Married   Retired   Clinical cytogeneticist level of education    No children    1 cup of coffee    Review of Systems: See HPI, otherwise negative ROS  Physical Exam: BP 122/70   Pulse 73   Temp (!) 96.4 F (35.8 C) (Tympanic)   Resp 16   Ht 5\' 6"  (1.676 m)   Wt 210 lb (95.3 kg)   SpO2 100%   BMI 33.89 kg/m  General:   Alert,  pleasant and cooperative in NAD Head:  Normocephalic and atraumatic. Neck:  Supple; no masses or thyromegaly. Lungs:  Clear throughout to auscultation.    Heart:  Regular rate and rhythm. Abdomen:  Soft, nontender and nondistended. Normal bowel sounds, without guarding, and without rebound.   Neurologic:  Alert and  oriented x4;  grossly normal  neurologically.  Impression/Plan: [name] is here for an colonoscopy to be performed for surveillance due to prior history of colon polyps   Risks, benefits, limitations, and alternatives regarding  colonoscopy have been reviewed with the patient.  Questions have been answered.  All parties agreeable.   Jonathon Bellows, MD  11/15/2016, 10:01 AM

## 2016-11-15 NOTE — Anesthesia Post-op Follow-up Note (Signed)
Anesthesia QCDR form completed.        

## 2016-11-16 ENCOUNTER — Encounter: Payer: Self-pay | Admitting: Gastroenterology

## 2016-11-16 LAB — SURGICAL PATHOLOGY

## 2016-11-20 ENCOUNTER — Encounter: Payer: Self-pay | Admitting: Gastroenterology

## 2016-11-26 ENCOUNTER — Ambulatory Visit: Payer: PPO | Admitting: Podiatry

## 2016-11-29 ENCOUNTER — Ambulatory Visit (INDEPENDENT_AMBULATORY_CARE_PROVIDER_SITE_OTHER): Payer: Self-pay | Admitting: Podiatry

## 2016-11-29 DIAGNOSIS — Q828 Other specified congenital malformations of skin: Secondary | ICD-10-CM

## 2016-11-29 NOTE — Progress Notes (Signed)
This patient presents the office for continued evaluation of callus on her left foot.  She says that she has experienced no pain or discomfort since her last visit.  She said  I treated her foot as well as recommended soft insoles be worn in her shoes.  She is very pleased not to have any pain or discomfort.  She presents the office today for continued evaluation and treatment of her painful left foot callus. Patient says that she worked on her nails herself last night.     General Appearance  Alert, conversant and in no acute stress.  Vascular  Dorsalis pedis and posterior pulses are palpable  bilaterally.  Capillary return is within normal limits  Bilaterally. Temperature is within normal limits  Bilaterally  Neurologic  Senn-Weinstein monofilament wire test within normal limits  bilaterally. Muscle power  Within normal limits bilaterally.  Nails Thick disfigured discolored nails with subungual debride bilaterally from hallux to fifth toes bilaterally. No evidence of bacterial infection or drainage bilaterally.  Orthopedic  No limitations of motion of motion feet bilaterally.  No crepitus or effusions noted.  No bony pathology or digital deformities noted.  Skin    No signs of infections or ulcers noted.  Porokeratosis noted on the medial and lateral aspect of the left heel.  Palpable callus noted sub-fifth metatarsal left foot.  Porokeratosis  Left foot  Debride porokeratosis left foot.   Gardiner Barefoot DPM

## 2016-12-02 ENCOUNTER — Other Ambulatory Visit: Payer: Self-pay | Admitting: Family Medicine

## 2016-12-13 ENCOUNTER — Telehealth: Payer: Self-pay | Admitting: Family Medicine

## 2016-12-13 ENCOUNTER — Ambulatory Visit: Payer: PPO | Admitting: Family Medicine

## 2016-12-13 ENCOUNTER — Encounter: Payer: Self-pay | Admitting: Family Medicine

## 2016-12-13 VITALS — BP 130/80 | Temp 97.6°F | Resp 12 | Wt 184.0 lb

## 2016-12-13 DIAGNOSIS — R634 Abnormal weight loss: Secondary | ICD-10-CM | POA: Insufficient documentation

## 2016-12-13 DIAGNOSIS — E876 Hypokalemia: Secondary | ICD-10-CM

## 2016-12-13 DIAGNOSIS — F3341 Major depressive disorder, recurrent, in partial remission: Secondary | ICD-10-CM

## 2016-12-13 LAB — COMPREHENSIVE METABOLIC PANEL
ALBUMIN: 4.4 g/dL (ref 3.5–5.2)
ALK PHOS: 98 U/L (ref 39–117)
ALT: 11 U/L (ref 0–35)
AST: 19 U/L (ref 0–37)
BILIRUBIN TOTAL: 1 mg/dL (ref 0.2–1.2)
BUN: 15 mg/dL (ref 6–23)
CALCIUM: 10.2 mg/dL (ref 8.4–10.5)
CO2: 30 mEq/L (ref 19–32)
CREATININE: 1.35 mg/dL — AB (ref 0.40–1.20)
Chloride: 92 mEq/L — ABNORMAL LOW (ref 96–112)
GFR: 41.36 mL/min — ABNORMAL LOW (ref >60.00)
Glucose, Bld: 125 mg/dL — ABNORMAL HIGH (ref 70–99)
Potassium: 2.5 mEq/L — CL (ref 3.5–5.1)
SODIUM: 134 meq/L — AB (ref 135–145)
TOTAL PROTEIN: 8.5 g/dL — AB (ref 6.0–8.3)

## 2016-12-13 LAB — CBC WITH DIFFERENTIAL/PLATELET
BASOS ABS: 0 10*3/uL (ref 0.0–0.1)
BASOS PCT: 0.4 % (ref 0.0–3.0)
Eosinophils Absolute: 0.1 10*3/uL (ref 0.0–0.7)
Eosinophils Relative: 1 % (ref 0.0–5.0)
HEMATOCRIT: 39.9 % (ref 36.0–46.0)
HEMOGLOBIN: 12.9 g/dL (ref 12.0–15.0)
LYMPHS PCT: 20.5 % (ref 12.0–46.0)
Lymphs Abs: 2.3 10*3/uL (ref 0.7–4.0)
MCHC: 32.4 g/dL (ref 30.0–36.0)
MCV: 83.1 fl (ref 78.0–100.0)
Monocytes Absolute: 0.7 10*3/uL (ref 0.1–1.0)
Monocytes Relative: 6 % (ref 3.0–12.0)
Neutro Abs: 8 10*3/uL — ABNORMAL HIGH (ref 1.4–7.7)
Neutrophils Relative %: 72.1 % (ref 43.0–77.0)
Platelets: 324 10*3/uL (ref 150.0–400.0)
RBC: 4.8 Mil/uL (ref 3.87–5.11)
RDW: 14.9 % (ref 11.5–15.5)
WBC: 11.1 10*3/uL — AB (ref 4.0–10.5)

## 2016-12-13 LAB — TSH: TSH: 2.42 u[IU]/mL (ref 0.35–4.50)

## 2016-12-13 LAB — SEDIMENTATION RATE: SED RATE: 70 mm/h — AB (ref 0–30)

## 2016-12-13 MED ORDER — POTASSIUM CHLORIDE 40 MEQ/15ML (20%) PO SOLN: 40.0000 meq | Freq: Two times a day (BID) | ORAL | 0 refills | Status: DC

## 2016-12-13 MED ORDER — POTASSIUM CHLORIDE CRYS ER 20 MEQ PO TBCR: 40.0000 meq | EXTENDED_RELEASE_TABLET | Freq: Two times a day (BID) | ORAL | 0 refills | Status: DC

## 2016-12-13 NOTE — Assessment & Plan Note (Signed)
Currently stable.  On Paxil.  No SI.  She will continue current regimen.

## 2016-12-13 NOTE — Telephone Encounter (Signed)
Critical lab Potassium of 2.5

## 2016-12-13 NOTE — Telephone Encounter (Signed)
Patient still refused evaluation.

## 2016-12-13 NOTE — Assessment & Plan Note (Signed)
Patient with fairly significant weight last month related to solid food dysphasia.  Intermittently feels as though it sticks in her throat.  Does not have much of an appetite.  Some early satiety.  No abdominal pain.  Her amount of weight loss over the last month is fairly concerning.  I suspect the patient needs an EGD to evaluate for stricture or other cause of symptoms.  I placed a call to [facility] to get their input.  Message was left and I am awaiting a call back.  We will check lab work as outlined below.  We will contact the patient once we hear back from GI.  She is given return precautions.

## 2016-12-13 NOTE — Telephone Encounter (Signed)
Noted.  Please encourage her to be evaluated given her palpitations.  Thanks.

## 2016-12-13 NOTE — Patient Instructions (Signed)
Nice to see you. We will get lab work today.  We will try to get you into see GI as well.  We will contact you later today regarding this. If your symptoms worsen or you develop any new symptoms please be evaluated again.

## 2016-12-13 NOTE — Progress Notes (Signed)
Patient advised of below and verbalized understanding.  

## 2016-12-13 NOTE — Telephone Encounter (Signed)
See phone note PCP aware and patient has received orders.

## 2016-12-13 NOTE — Progress Notes (Signed)
Tommi Rumps, MD Phone: [phone]  Anne [name] is a 66 y.o. female who presents today for follow-up.  Patient notes over the last month she has had intermittent sensation of food getting stuck in her lower throat.  She does note some early satiety as well when she is able to swallow easily.  The food does always go down.  She has no liquid dysphagia.  No regurgitation.  No blood in her stool.  Some nausea.  No abdominal pain.  No vomiting.  No sweats.  No reflux symptoms.  She just does not feel well overall.  She does note EGD in the past that revealed polyps though she is unable to tell me where.  She did recently have a colonoscopy that revealed polyps as well.  She does feel depressed though it is not any worse than usual.  She is current on Paxil.  She notes no SI.  She notes no other symptoms.  Social History   Tobacco Use  Smoking Status Former Smoker  . Last attempt to quit: 02/07/2012  . Years since quitting: 4.8  Smokeless Tobacco Never Used     ROS see history of present illness  Objective  Physical Exam Vitals:   12/13/16 0803  BP: 130/80  Resp: 12  Temp: 97.6 F (36.4 C)  SpO2: 97%    BP Readings from Last 3 Encounters:  12/13/16 130/80  11/15/16 110/72  10/17/16 (!) 144/66   Wt Readings from Last 3 Encounters:  12/13/16 184 lb (83.5 kg)  11/15/16 210 lb (95.3 kg)  10/17/16 204 lb (92.5 kg)    Physical Exam  Constitutional: No distress.  HENT:  Head: Normocephalic and atraumatic.  Mouth/Throat: Oropharynx is clear and moist. No oropharyngeal exudate.  Cardiovascular: Normal rate, regular rhythm and normal heart sounds.  Pulmonary/Chest: Effort normal and breath sounds normal.  Abdominal: Soft. Bowel sounds are normal. She exhibits no distension. There is no tenderness. There is no rebound and no guarding.  Musculoskeletal: She exhibits no edema.  Neurological: She is alert. Gait normal.  Skin: Skin is warm and dry. She is not diaphoretic.      Assessment/Plan: Please see individual problem list.  Weight loss Patient with fairly significant weight last month related to solid food dysphasia.  Intermittently feels as though it sticks in her throat.  Does not have much of an appetite.  Some early satiety.  No abdominal pain.  Her amount of weight loss over the last month is fairly concerning.  I suspect the patient needs an EGD to evaluate for stricture or other cause of symptoms.  I placed a call to [facility] to get their input.  Message was left and I am awaiting a call back.  We will check lab work as outlined below.  We will contact the patient once we hear back from GI.  She is given return precautions.  Depression Currently stable.  On Paxil.  No SI.  She will continue current regimen.   Anne [name] was seen today for follow-up.  Diagnoses and all orders for this visit:  Weight loss -     CBC w/Diff -     Comp Met (CMET) -     TSH -     Sedimentation rate  Recurrent major depressive disorder, in partial remission ([place])    Orders Placed This Encounter  Procedures  . CBC w/Diff  . Comp Met (CMET)  . TSH  . Sedimentation rate    No orders of the defined  types were placed in this encounter.    Tommi Rumps, MD [place]

## 2016-12-13 NOTE — Telephone Encounter (Signed)
Prescription for potassium sent to pharmacy.

## 2016-12-13 NOTE — Telephone Encounter (Signed)
Received a critical value Potassium 2.5 Critical value called to Kerin Salen LPN at [facility] at [facility]

## 2016-12-13 NOTE — Telephone Encounter (Signed)
Please let the patient know that her potassium is low.  She has mild dehydration.  If she has muscle weakness or palpitations she should be evaluated in the emergency room to consider IV potassium.  If she does not have these things we will need to replete her potassium with oral supplementation.  I have sent this to her pharmacy.  She will need recheck of her labs tomorrow.  She will need to discontinue her triamterene HCTZ as this is likely contributing.  If she is unable to tolerate the oral potassium she will need to be evaluated in the emergency room for IV potassium.

## 2016-12-13 NOTE — Telephone Encounter (Signed)
Copied from [facility] [id number]. Topic: Quick Communication - See Telephone Encounter >> Dec 13, 2016  1:13 PM Cleaster Corin, [place] wrote: CRM for notification. See Telephone encounter for:   12/13/16. Albina Billet from [facility] called and said they are out of potassium chloride 40 MEQ  )also called around to other pharmacies and they didn't have any) wanted to see if Dr. Caryl Bis would change the RX. To tablets 20MEQ please call  krystal back at [place] at [phone]

## 2016-12-13 NOTE — Progress Notes (Signed)
Received a call back from Dr Nolon Rod from [facility]. I relayed the patient history to her and the patient name and she advised that an EGD would be warranted. She noted she would discuss with Dr Vicente Males and review the patient chart and they would get in touch with the patient to get her set up for evaluation. I will have CMA contact the patient to let her know she should be receiving a call from GI soon to set this up.

## 2016-12-13 NOTE — Telephone Encounter (Signed)
Patient is picking p the oral refused ER due to having small children in the home she cannot leave alone . Patient staed she would pick up the potassium and start and come in for lab tomorrow , patient feels weak and having some palpitations at times , advised patient symptoms worsen she will have to go to ER. Lab scheduled.

## 2016-12-14 ENCOUNTER — Other Ambulatory Visit: Payer: Self-pay | Admitting: Family Medicine

## 2016-12-14 ENCOUNTER — Other Ambulatory Visit (INDEPENDENT_AMBULATORY_CARE_PROVIDER_SITE_OTHER): Payer: PPO

## 2016-12-14 ENCOUNTER — Telehealth: Payer: Self-pay | Admitting: Family Medicine

## 2016-12-14 DIAGNOSIS — E876 Hypokalemia: Secondary | ICD-10-CM

## 2016-12-14 LAB — BASIC METABOLIC PANEL
BUN: 15 mg/dL (ref 6–23)
CALCIUM: 10 mg/dL (ref 8.4–10.5)
CO2: 30 mEq/L (ref 19–32)
CREATININE: 1.27 mg/dL — AB (ref 0.40–1.20)
Chloride: 92 mEq/L — ABNORMAL LOW (ref 96–112)
GFR: 44.38 mL/min — AB (ref >60.00)
Glucose, Bld: 125 mg/dL — ABNORMAL HIGH (ref 70–99)
Potassium: 3.1 mEq/L — ABNORMAL LOW (ref 3.5–5.1)
Sodium: 134 mEq/L — ABNORMAL LOW (ref 135–145)

## 2016-12-14 MED ORDER — POTASSIUM CHLORIDE CRYS ER 20 MEQ PO TBCR: 40.0000 meq | EXTENDED_RELEASE_TABLET | Freq: Two times a day (BID) | ORAL | 0 refills | Status: DC

## 2016-12-14 NOTE — Telephone Encounter (Signed)
FYI

## 2016-12-14 NOTE — Telephone Encounter (Signed)
Pt given results. Pt informed to stop triamterene-HCTZ  as per result note. Pt has taken 6 potassium pills so far.

## 2016-12-14 NOTE — Telephone Encounter (Signed)
Patient notified and scheduled for lab please place order

## 2016-12-14 NOTE — Telephone Encounter (Signed)
Order placed

## 2016-12-14 NOTE — Telephone Encounter (Signed)
Additional potassium sent to pharmacy.  Patient needs BMP on Monday.

## 2016-12-14 NOTE — Telephone Encounter (Signed)
Please advise 

## 2016-12-17 ENCOUNTER — Other Ambulatory Visit (INDEPENDENT_AMBULATORY_CARE_PROVIDER_SITE_OTHER): Payer: PPO

## 2016-12-17 DIAGNOSIS — E876 Hypokalemia: Secondary | ICD-10-CM

## 2016-12-17 DIAGNOSIS — R748 Abnormal levels of other serum enzymes: Secondary | ICD-10-CM

## 2016-12-17 LAB — BASIC METABOLIC PANEL
BUN: 12 mg/dL (ref 6–23)
CALCIUM: 10.3 mg/dL (ref 8.4–10.5)
CO2: 29 mEq/L (ref 19–32)
Chloride: 93 mEq/L — ABNORMAL LOW (ref 96–112)
Creatinine, Ser: 1.02 mg/dL (ref 0.40–1.20)
GFR: 57.15 mL/min — AB (ref >60.00)
Glucose, Bld: 110 mg/dL — ABNORMAL HIGH (ref 70–99)
Potassium: 2.9 mEq/L — ABNORMAL LOW (ref 3.5–5.1)
Sodium: 134 mEq/L — ABNORMAL LOW (ref 135–145)

## 2016-12-17 LAB — GAMMA GT: GGT: 97 U/L — AB (ref 7–51)

## 2016-12-17 LAB — ALKALINE PHOSPHATASE: Alkaline Phosphatase: 96 U/L (ref 39–117)

## 2016-12-17 NOTE — Telephone Encounter (Signed)
Pt came in for repeat labs this morning. She states that she took 2 Potassium pills on Friday & 2 more on Sunday, but she refuses to take anymore due to the awful taste & other side effects such as sore throat & loss of appetite. I informed her that I sent another refill in this morning & she said that she will NOT pick it up.

## 2016-12-18 NOTE — Telephone Encounter (Signed)
Anne [name]- does patient have planned follow up for blood pressure given the change? Does she have a planned bmet follow up under hypokalemia? Or would that be scheduled at next visit?

## 2016-12-19 ENCOUNTER — Emergency Department
Admission: EM | Admit: 2016-12-19 | Discharge: 2016-12-19 | Disposition: A | Discharge status: Home / Self Care | Payer: PPO | Attending: Emergency Medicine | Admitting: Emergency Medicine

## 2016-12-19 ENCOUNTER — Encounter: Payer: Self-pay | Admitting: Intensive Care

## 2016-12-19 ENCOUNTER — Ambulatory Visit: Payer: Self-pay | Admitting: *Deleted

## 2016-12-19 DIAGNOSIS — Z5321 Procedure and treatment not carried out due to patient leaving prior to being seen by health care provider: Secondary | ICD-10-CM | POA: Diagnosis not present

## 2016-12-19 DIAGNOSIS — R1110 Vomiting, unspecified: Secondary | ICD-10-CM | POA: Insufficient documentation

## 2016-12-19 LAB — COMPREHENSIVE METABOLIC PANEL
ALBUMIN: 4.2 g/dL (ref 3.5–5.0)
ALT: 12 U/L — AB (ref 14–54)
AST: 25 U/L (ref 15–41)
Alkaline Phosphatase: 100 U/L (ref 38–126)
Anion gap: 15 (ref 5–15)
BUN: 14 mg/dL (ref 6–20)
CALCIUM: 10.1 mg/dL (ref 8.9–10.3)
CHLORIDE: 95 mmol/L — AB (ref 101–111)
CO2: 26 mmol/L (ref 22–32)
CREATININE: 1.05 mg/dL — AB (ref 0.44–1.00)
GFR calc Af Amer: >60 mL/min (ref >60)
GFR, EST NON AFRICAN AMERICAN: 53 mL/min — AB (ref >60)
GLUCOSE: 111 mg/dL — AB (ref 65–99)
Potassium: 3.1 mmol/L — ABNORMAL LOW (ref 3.5–5.1)
Sodium: 136 mmol/L (ref 135–145)
TOTAL PROTEIN: 8.9 g/dL — AB (ref 6.5–8.1)
Total Bilirubin: 1.5 mg/dL — ABNORMAL HIGH (ref 0.3–1.2)

## 2016-12-19 LAB — CBC
HCT: 40.4 % (ref 35.0–47.0)
Hemoglobin: 13.6 g/dL (ref 12.0–16.0)
MCH: 27.3 pg (ref 26.0–34.0)
MCHC: 33.6 g/dL (ref 32.0–36.0)
MCV: 81.4 fL (ref 80.0–100.0)
PLATELETS: 256 10*3/uL (ref 150–440)
RBC: 4.97 MIL/uL (ref 3.80–5.20)
RDW: 15 % — AB (ref 11.5–14.5)
WBC: 11 10*3/uL (ref 3.6–11.0)

## 2016-12-19 LAB — LIPASE, BLOOD: LIPASE: 48 U/L (ref 11–51)

## 2016-12-19 NOTE — Telephone Encounter (Signed)
Have her take potassium for next 2 days daily then follow up at [place] for discussion about repeat bmet and changes in potassium prescription  Route to [facility] basket please

## 2016-12-19 NOTE — ED Triage Notes (Signed)
Patient reports being placed on potassium pills X1 week ago for low levels. C/o emesis for a week which brought her into hospital today and reports she cannot eat or drink anything to keep it down. A&O x4. Ambulatory in triage with no problems

## 2016-12-19 NOTE — ED Notes (Signed)
Pt left after triage

## 2016-12-19 NOTE — Telephone Encounter (Signed)
Spoke with you about this earlier. Please advise

## 2016-12-19 NOTE — Telephone Encounter (Signed)
Patient states she will take the potassium prescription that was sent in for two days. Patient states if she need potassium more then two days then she would like an rx for the potassium powder instead of tablets. Does patient need to come in for recheck labs or office visit sooner then her follow up appointment that is scheduled 02/08/16? Patient is scheduled with GI as well on 12/24/16. Please advise

## 2016-12-19 NOTE — Telephone Encounter (Signed)
Please see if she can come in today for Anne [name] if possible

## 2016-12-19 NOTE — Telephone Encounter (Signed)
I am not able to get an appt with any of the providers for the next 2 weeks.  I routed a note to the nurse pool at [facility] to see if they could work her in or further advise her. I suggested she try taking the potassium tablets broken in half in apple sauce or pudding to see if that would help her take the pills.   I also suggested eating bananas and drinking OJ to get in some potassium.   She stated,  "She would try that"

## 2016-12-19 NOTE — Telephone Encounter (Signed)
Patient states she will just go to the ER she feels exhausted and has not been able to keep the potassium tablet down.

## 2016-12-19 NOTE — Telephone Encounter (Signed)
Called in regarding a low potassium level and not being able to take the Rx potassium that was prescribed.   It's so big and it tastes awful!  I'm not going to take it.

## 2016-12-20 ENCOUNTER — Other Ambulatory Visit: Payer: Self-pay | Admitting: Internal Medicine

## 2016-12-20 ENCOUNTER — Ambulatory Visit (INDEPENDENT_AMBULATORY_CARE_PROVIDER_SITE_OTHER)
Admission: RE | Admit: 2016-12-20 | Discharge: 2016-12-20 | Disposition: A | Discharge status: Home / Self Care | Payer: PPO | Source: Ambulatory Visit | Attending: Internal Medicine | Admitting: Internal Medicine

## 2016-12-20 ENCOUNTER — Encounter: Payer: Self-pay | Admitting: Internal Medicine

## 2016-12-20 ENCOUNTER — Ambulatory Visit: Payer: PPO | Admitting: Internal Medicine

## 2016-12-20 VITALS — BP 122/80 | HR 82 | Temp 97.8°F | Wt 181.0 lb

## 2016-12-20 DIAGNOSIS — R634 Abnormal weight loss: Secondary | ICD-10-CM | POA: Diagnosis not present

## 2016-12-20 DIAGNOSIS — R112 Nausea with vomiting, unspecified: Secondary | ICD-10-CM | POA: Diagnosis not present

## 2016-12-20 DIAGNOSIS — R5383 Other fatigue: Secondary | ICD-10-CM

## 2016-12-20 DIAGNOSIS — Z87891 Personal history of nicotine dependence: Secondary | ICD-10-CM

## 2016-12-20 DIAGNOSIS — R531 Weakness: Secondary | ICD-10-CM | POA: Diagnosis not present

## 2016-12-20 DIAGNOSIS — J9811 Atelectasis: Secondary | ICD-10-CM | POA: Diagnosis not present

## 2016-12-20 LAB — COMPREHENSIVE METABOLIC PANEL
ALT: 10 U/L (ref 0–35)
AST: 19 U/L (ref 0–37)
Albumin: 4.4 g/dL (ref 3.5–5.2)
Alkaline Phosphatase: 93 U/L (ref 39–117)
BUN: 15 mg/dL (ref 6–23)
CALCIUM: 10.4 mg/dL (ref 8.4–10.5)
CHLORIDE: 93 meq/L — AB (ref 96–112)
CO2: 31 meq/L (ref 19–32)
Creatinine, Ser: 1.05 mg/dL (ref 0.40–1.20)
GFR: 55.27 mL/min — AB (ref >60.00)
GLUCOSE: 109 mg/dL — AB (ref 70–99)
Potassium: 2.9 mEq/L — ABNORMAL LOW (ref 3.5–5.1)
Sodium: 136 mEq/L (ref 135–145)
Total Bilirubin: 1.2 mg/dL (ref 0.2–1.2)
Total Protein: 8.4 g/dL — ABNORMAL HIGH (ref 6.0–8.3)

## 2016-12-20 LAB — CBC
HCT: 40 % (ref 36.0–46.0)
Hemoglobin: 13 g/dL (ref 12.0–15.0)
MCHC: 32.6 g/dL (ref 30.0–36.0)
MCV: 83.9 fl (ref 78.0–100.0)
PLATELETS: 263 10*3/uL (ref 150.0–400.0)
RBC: 4.77 Mil/uL (ref 3.87–5.11)
RDW: 15.4 % (ref 11.5–15.5)
WBC: 10.8 10*3/uL — ABNORMAL HIGH (ref 4.0–10.5)

## 2016-12-20 MED ORDER — POTASSIUM CHLORIDE 20 MEQ PO PACK: 20.0000 meq | PACK | Freq: Two times a day (BID) | ORAL | 0 refills | Status: DC

## 2016-12-20 NOTE — Telephone Encounter (Signed)
Patient was seen today at [facility], no office visits available this week in our office

## 2016-12-20 NOTE — Telephone Encounter (Signed)
Would have been more ideal to have office visit- if you guys have opening see if she can come in

## 2016-12-20 NOTE — Progress Notes (Signed)
Subjective:    Patient ID: Anne [name], female    DOB: [birth year], 66 y.o.   MRN: [id number]  HPI  Pt presents to the clinic today with c/o "not feeling well", nausea, vomiting, poor appetite and 40 lb weight loss in the last 5 months. She reports she feels like her food gets stuck in her throat, but she denies choking. She reports she was also recently started on potassium supplements, but vomits up the pills right after she takes them. She denies abdominal pain, difficulty breathing, shortness of breath or blood in her stool. She reports she saw her PCP on 11/8 for the same. She had some labs drawn which were significant for hypokalemia. Dr. Caryl Bis stopped her Triamterene HCT. She reports she felt so bad, she went to the ER yesterday, but left after triage. They did recheck her potassium which was slightly higher at 3.1. Her creatinine was mildly elevated and sodium was marginally low. She reports she had a colonoscopy 11/2016, she did have polyps but none were cancerous. She is a former smoker. She has no family history of cancer that she is aware of.  Review of Systems  Past Medical History:  Diagnosis Date  . Arthritis   . Depression   . Hyperlipidemia   . Hypertension     Current Outpatient Medications  Medication Sig Dispense Refill  . amLODipine (NORVASC) 10 MG tablet Take 1 tablet (10 mg total) by mouth daily. 90 tablet 3  . cyclobenzaprine (FLEXERIL) 10 MG tablet TAKE 1 TABLET BY MOUTH AT BEDTIME 30 tablet 3  . DHA-Vitamin C-Lutein (EYE HEALTH FORMULA PO) Take by mouth.    . Fish Oil-Cholecalciferol (FISH OIL + D3) 1000-1000 MG-UNIT CAPS Take by mouth.    . meloxicam (MOBIC) 15 MG tablet Take 1 tablet (15 mg total) by mouth daily. 30 tablet 1  . naproxen (NAPROSYN) 500 MG tablet Take 1 tablet (500 mg total) by mouth 2 (two) times daily with a meal. 30 tablet 0  . oxybutynin (DITROPAN-XL) 5 MG 24 hr tablet TAKE 1 TABLET BY MOUTH AT BEDTIME 90 tablet 1  . PARoxetine  (PAXIL) 20 MG tablet TAKE ONE TABLET BY MOUTH ONCE DAILY 90 tablet 2  . promethazine (PHENERGAN) 12.5 MG tablet Take 1 tablet (12.5 mg total) by mouth every 8 (eight) hours as needed for nausea or vomiting. 20 tablet 0  . St Johns Wort 150 MG CAPS Take 1 capsule by mouth daily.    . rosuvastatin (CRESTOR) 20 MG tablet Take 1 tablet (20 mg total) by mouth daily. (Patient not taking: Reported on 12/20/2016) 90 tablet 3   No current facility-administered medications for this visit.     No Known Allergies  Family History  Problem Relation Age of Onset  . Cancer Mother        ovarian  . Hypertension Mother   . Ovarian cancer Mother 29  . Heart disease Father   . Stroke Father   . Ovarian cancer Sister        ? dx cancer had hyst.  . Breast cancer Neg Hx     Social History   Socioeconomic History  . Marital status: Married    Spouse name: Not on file  . Number of children: Not on file  . Years of education: Not on file  . Highest education level: Not on file  Social Needs  . Financial resource strain: Not on file  . Food insecurity - worry: Not on file  .  Food insecurity - inability: Not on file  . Transportation needs - medical: Not on file  . Transportation needs - non-medical: Not on file  Occupational History  . Not on file  Tobacco Use  . Smoking status: Former Smoker    Last attempt to quit: 02/07/2012    Years since quitting: 4.8  . Smokeless tobacco: Never Used  Substance and Sexual Activity  . Alcohol use: No    Alcohol/week: 0.0 oz  . Drug use: No  . Sexual activity: Yes  Other Topics Concern  . Not on file  Social History Narrative   Married   Retired   Clinical cytogeneticist level of education    No children    1 cup of coffee     Constitutional: Pt reports fatigue and weight loss. Denies fever, malaise, headache.  HEENT: Denies eye pain, eye redness, ear pain, ringing in the ears, wax buildup, runny nose, nasal congestion, bloody nose, or sore  throat. Respiratory: Denies difficulty breathing, shortness of breath, cough or sputum production.   Cardiovascular: Denies chest pain, chest tightness, palpitations or swelling in the hands or feet.  Gastrointestinal: Pt reports difficulty swallowing, poor appetite, nausea and vomiting. Denies abdominal pain, bloating, constipation, diarrhea or blood in the stool.  GU: Denies urgency, frequency, pain with urination, burning sensation, blood in urine, odor or discharge.  No other specific complaints in a complete review of systems (except as listed in HPI above).     Objective:   Physical Exam   BP 122/80   Pulse 82   Temp 97.8 F (36.6 C) (Oral)   Wt 181 lb (82.1 kg)   SpO2 98%   BMI 29.66 kg/m  Wt Readings from Last 3 Encounters:  12/20/16 181 lb (82.1 kg)  12/19/16 175 lb (79.4 kg)  12/13/16 184 lb (83.5 kg)    General: Appears her stated age, in NAD. Neck:  Neck supple, trachea midline. No masses, lumps or thyromegaly present.  Cardiovascular: Normal rate and rhythm. S1,S2 noted.  No murmur, rubs or gallops noted. Pulmonary/Chest: Normal effort and positive vesicular breath sounds. No respiratory distress. No wheezes, rales or ronchi noted.  Abdomen: Soft and nontender. Hypoactive bowel sounds. No distention or masses noted.  Neurological: Alert and oriented.    BMET    Component Value Date/Time   NA 136 12/19/2016 1513   K 3.1 (L) 12/19/2016 1513   CL 95 (L) 12/19/2016 1513   CO2 26 12/19/2016 1513   GLUCOSE 111 (H) 12/19/2016 1513   BUN 14 12/19/2016 1513   CREATININE 1.05 (H) 12/19/2016 1513   CREATININE 1.09 02/26/2012 0925   CALCIUM 10.1 12/19/2016 1513   GFRNONAA 53 (L) 12/19/2016 1513   GFRNONAA 54 (L) 02/26/2012 0925   GFRAA >60 12/19/2016 1513   GFRAA >60 02/26/2012 0925    Lipid Panel     Component Value Date/Time   CHOL 193 09/12/2016 0826   TRIG 267.0 (H) 09/12/2016 0826   HDL 39.60 09/12/2016 0826   CHOLHDL 5 09/12/2016 0826   VLDL 53.4 (H)  09/12/2016 0826    CBC    Component Value Date/Time   WBC 11.0 12/19/2016 1513   RBC 4.97 12/19/2016 1513   HGB 13.6 12/19/2016 1513   HCT 40.4 12/19/2016 1513   PLT 256 12/19/2016 1513   MCV 81.4 12/19/2016 1513   MCH 27.3 12/19/2016 1513   MCHC 33.6 12/19/2016 1513   RDW 15.0 (H) 12/19/2016 1513   LYMPHSABS 2.3 12/13/2016 0831   MONOABS  0.7 12/13/2016 0831   EOSABS 0.1 12/13/2016 0831   BASOSABS 0.0 12/13/2016 0831    Hgb A1C Lab Results  Component Value Date   HGBA1C 6.1 09/12/2016           Assessment & Plan:   Fatigue, Weakness, Weight Loss, Nausea and Vomiting:  Will repeat CBC and CMET today Will obtain chest xray as well given her smoking history and recent weight loss She may benefit from an upper GI and has an appt with GI on 12/24/16 She has promethazine to take for nausea as needed Encouraged her to consume a clear liquid diet, advancing to bland foods as tolerated Consider PPI but will defer to GI on this  Hypokalemia:  CMET today If potassium low, change tabs to powder  Will follow up after labs and xrays, return precautions discussed Webb Silversmith, NP

## 2016-12-20 NOTE — Patient Instructions (Signed)
Dysphagia Diet Level 2, Mechanically Altered °The dysphagia level 2 diet includes foods that are blended, chopped, ground, or mashed so they are easier to chew and swallow. The foods are soft, moist, and can be chopped into ¼-inch chunks (such as pancakes, pasta, and bananas). °In order to be on this diet, you must be able to chew. This diet helps you transition between the pureed textures of the dysphagia level 1 diet to more solid textures. This diet is helpful for people with mild to moderate swallowing difficulties. It reduces the risk of food getting caught in the windpipe, trachea, or lungs. °You may need help or supervision during meals while following this diet so that you eat safely. You will be on this diet until your health care provider advances the texture of your diet. °What do I need to know about this diet? °Foods °· You may eat foods that are soft and moist. °? You may need to use a blender, whisk, or masher to soften some of your foods. °? You can moisten foods with gravies, sauces, vegetable or fruit juice, milk, half and half, or water when blending, mashing, or grinding your foods to the right consistency. °· If you were on the dysphagia level 1 diet, you may still eat any of the foods included in that diet. °· Avoid foods that are dry, hard, sticky, chewy, coarse, and crunchy. Also avoid large cuts of food. °· Take small bites. Each bite should contain ¼ inch or less of food. °Liquids °· Avoid liquids with seeds and chunks. °· Thicken liquids, if instructed by your health care provider. Your health care provider will tell you the consistency to which you should thicken your liquids for safe swallowing. To thicken a liquid, use a commercial thickener or a thickening food (such as rice cereal or potato flakes). Ask your health care provider for specific recommendations on thickeners. °See your dietitian or health care provider regularly for help with your dietary changes. °What foods can I  eat? °Grains °Store-bought soft breads that do not have nuts or seeds. Pancakes, sweet rolls, Danish pastries, and French toast that have been moistened with syrup or sauce to form a slurry when blended. Well-cooked pasta, noodles, and bread dressing. Well-cooked noodles and pasta in sauce. Moist macaroni and cheese. Soft dumplings or spaetzle with gravy or butter. Cooked cereals (including oatmeal). Low-texture dry cereals, such as rice puff, corn, or wheat-flake cereals, with milk (if thin liquids are not allowed, make sure all of the milk is absorbed by the cereal before eating it). °Vegetables °Very soft, well-cooked vegetables in pieces less than ½ inch in size. Cooked potatoes that are moist, not crispy, and with sauce. °Fruits °Canned or cooked fruits that are soft or moist and do not have skin or seeds. Fresh, soft bananas. Fruit juices with a small amount of pulp (if thin liquids are allowed). Gelatin or plain gelatin with canned fruit, except pineapple. °Meat and Other Protein Sources °Tender, moist meats, poultry, or fish cooked with gravy or sauce and cubed to ¼-inch bites or smaller. Ground meat. Moist meatball or meatloaf. Fish without bones. Moist casseroles without rice. Tuna, egg, or meat salad without chunks or hard-to-chew vegetables, such as celery and onions. Smooth quiche without large chunks. Scrambled, poached, or soft-cooked eggs with butter, margarine, sauce, or gravy. Tofu. Well-cooked, moistened and mashed beans, peas, baked beans, and other legumes. Casseroles without rice (such as tuna noodle casserole or soft moist meat lasagna). °Dairy °Cream cheese. Yogurt. Cottage   cheese. Ask your health care provider if milk is allowed. °Sweets/Desserts °Pudding. Custard. Soft fruit pies with crust on the bottom only. Crisps and cobblers without seeds or nuts and with soft crusts. Soft, moist cakes. Icing. Pre-gelled cookies. Soft, moist cookies dunked in milk, coffee, or another liquid. Jelly.  Soft, smooth chocolate bars that are easily chewed. Jams and preserves without seeds. Ask your health care provider whether you can have frozen desserts. °Fats and Oils °Butter. Margarine. Cream for cereal, depending on liquid consistency allowed. Gravy. Cream sauces. Mayonnaise. Salad dressings. Cream cheese. Cheese spreads, plain or with soft fruits or vegetables added. Sour cream. Sour cream dips with soft fruits or vegetables added. Whipped toppings. °Other °Sauces and salsas that have soft chunks that are about ½ inch or smaller. °The items listed above may not be a complete list of recommended foods or beverages. Contact your dietitian for more options. °What foods are not recommended? °Grains °All breads not listed in the recommended list. Breads that are hard or have nuts or seeds. Coarse cereals. Cereals that have nuts, seeds, dried fruits, or coconut. Rice. Corn. °Vegetables °Whole, raw, frozen, or dried vegetables. Tough, fibrous, chewy, or stringy cooked vegetables, such as celery, peas, broccoli, cabbage, Brussels sprouts, and asparagus. Potato skins. Potato and other vegetable chips. Fried or French-fried potatoes. Cooked corn and peas. °Fruits °Whole raw, frozen, or dried fruits, including coconut. Pineapple. Fruits with seeds. °Meat and Other Protein Sources °Dry, tough meats, such as bacon, sausage, and hot dogs. Cheese slices and cubes. Peanut butter. Hard boiled or fried eggs. Nuts. Seeds. Pizza. Sandwiches. Dry casseroles or casseroles with rice or large chunks. °Dairy °Yogurt with nuts, seeds, or large chunks. °Sweets/Desserts °Coarse, hard, chewy, or sticky desserts. Any dessert with nuts, seeds, coconut, pineapple, or dried fruit. Ask your health care provider whether you can have frozen desserts. °Fats and Oils °Avoid fats with chunky, large textures, such as those with nuts or fruits. °Other °Soups and casseroles with large chunks. °The items listed above may not be a complete list of foods  and beverages to avoid. Contact your dietitian for more information. °This information is not intended to replace advice given to you by your health care provider. Make sure you discuss any questions you have with your health care provider. °Document Released: 01/22/2005 Document Revised: 06/30/2015 Document Reviewed: 01/05/2013 °Elsevier Interactive Patient Education © 2018 [employer] ° ° ° °

## 2016-12-24 ENCOUNTER — Other Ambulatory Visit
Admission: RE | Admit: 2016-12-24 | Discharge: 2016-12-24 | Disposition: A | Discharge status: Home / Self Care | Payer: PPO | Source: Ambulatory Visit | Attending: Gastroenterology | Admitting: Gastroenterology

## 2016-12-24 ENCOUNTER — Other Ambulatory Visit: Payer: Self-pay

## 2016-12-24 ENCOUNTER — Encounter: Payer: Self-pay | Admitting: Gastroenterology

## 2016-12-24 ENCOUNTER — Ambulatory Visit (INDEPENDENT_AMBULATORY_CARE_PROVIDER_SITE_OTHER): Payer: PPO | Admitting: Gastroenterology

## 2016-12-24 ENCOUNTER — Telehealth: Payer: Self-pay

## 2016-12-24 VITALS — BP 122/66 | HR 91 | Ht 66.0 in | Wt 177.2 lb

## 2016-12-24 DIAGNOSIS — R634 Abnormal weight loss: Secondary | ICD-10-CM

## 2016-12-24 DIAGNOSIS — E876 Hypokalemia: Secondary | ICD-10-CM

## 2016-12-24 LAB — COMPREHENSIVE METABOLIC PANEL
ALBUMIN: 4.1 g/dL (ref 3.5–5.0)
ALT: 13 U/L — ABNORMAL LOW (ref 14–54)
ANION GAP: 15 (ref 5–15)
AST: 28 U/L (ref 15–41)
Alkaline Phosphatase: 93 U/L (ref 38–126)
BILIRUBIN TOTAL: 1.4 mg/dL — AB (ref 0.3–1.2)
BUN: 16 mg/dL (ref 6–20)
CO2: 27 mmol/L (ref 22–32)
Calcium: 9.8 mg/dL (ref 8.9–10.3)
Chloride: 91 mmol/L — ABNORMAL LOW (ref 101–111)
Creatinine, Ser: 0.99 mg/dL (ref 0.44–1.00)
GFR calc Af Amer: >60 mL/min (ref >60)
GFR calc non Af Amer: 57 mL/min — ABNORMAL LOW (ref >60)
GLUCOSE: 114 mg/dL — AB (ref 65–99)
POTASSIUM: 2.8 mmol/L — AB (ref 3.5–5.1)
SODIUM: 133 mmol/L — AB (ref 135–145)
Total Protein: 8.3 g/dL — ABNORMAL HIGH (ref 6.5–8.1)

## 2016-12-24 LAB — MAGNESIUM: Magnesium: 1.8 mg/dL (ref 1.7–2.4)

## 2016-12-24 NOTE — Addendum Note (Signed)
Addended by: Vonda Antigua on: 12/24/2016 04:13 PM   Modules accepted: Orders

## 2016-12-24 NOTE — Telephone Encounter (Signed)
Pharmacy has been asked to fill rx for liquid potassium 50 MeQ today then 20 mEq daily enough for 2 weeks.  Pt  Has been advised to continue 50meq of potassium until we notify her to stop.  Go to lab on Monday for recheck.  Thanks [employer]

## 2016-12-24 NOTE — Patient Instructions (Addendum)
--  You are scheduled for a CT Chest and CT abdomen and Pelvis at [facility] outpatient imaging, [facility] location on Wednesday, Nov 28th at 9:30am. Please arrrive at 9:15am. You cannot have anything to eat or drink 4 hours prior. You will need to pick up a contrast prep kit prior to scan.   --If you need to reschedule this appointment for any reason, please contact central scheduling at [phone].  --Labs have been ordered today. Please go to [facility] and check in at the [facility] registration desk.   --If you have any questions or concerns, please contact our office at [phone].  --Purchase some Boost or Ensure to help with weight loss.

## 2016-12-24 NOTE — Progress Notes (Signed)
Vonda Antigua, MD [address] [place], [place], [place], [address] [address], [place], [place], [place], [address] Phone: [phone]  Fax: [phone]  Consultation  Referring Provider:     Leone Haven, MD Primary Care Physician:  Leone Haven, MD Primary Gastroenterologist:  Virgel Manifold, MD        Reason for Consultation:     Weight loss, dysphagia  Date of Admission:  Date of Consultation:  12/24/2016         HPI:   Anne [name] is a 66 y.o. female referred for dysphasia and weight loss.  Patient states symptoms of dysphagia began a year ago with solids and have worsened over the last month.  She is a documented weight loss of 40 pounds over the last year.  She is unable to swallow her pills, and has been avoiding taking them due to the dysphagia.  She is only in taking liquids at this time.  She denies any abdominal pain.  She also reports episodes of emesis, a few hours after in taking any liquids or solids.  She also reports altered bowel habits with constipation over the past year.  Goes 1 week without a bowel movement at times.  No family history of colon cancer.  Had a colonoscopy in October 2019 by Dr. Vicente Males with 3 polyps less than 10 mm removed with 2 showing adenoma in 1 showing food debris only.  No blood in bowel movements.  No previous EGDs.  No previous CT scans available.  Has a history of tobacco use.  Recent labs showed hypokalemia, patient is on potassium supplements but is not taking them due to dysphagia.  NSAIDs are also listed on her medication list but patient states she is not taking them due to dysphagia.   Past Medical History:  Diagnosis Date  . Arthritis   . Depression   . Hyperlipidemia   . Hypertension     Past Surgical History:  Procedure Laterality Date  . APPENDECTOMY  1971  . CHOLECYSTECTOMY  1971  . COLONOSCOPY WITH PROPOFOL N/A 11/15/2016   Performed by Jonathon Bellows, MD at [facility]  . TONSILECTOMY,  ADENOIDECTOMY, BILATERAL MYRINGOTOMY AND TUBES  1955    Prior to Admission medications   Medication Sig Start Date End Date Taking? Authorizing Provider  cyclobenzaprine (FLEXERIL) 10 MG tablet TAKE 1 TABLET BY MOUTH AT BEDTIME 12/03/16  Yes Leone Haven, MD  DHA-Vitamin C-Lutein (EYE HEALTH FORMULA PO) Take by mouth.   Yes [provider]  Fish Oil-Cholecalciferol (FISH OIL + D3) 1000-1000 MG-UNIT CAPS Take by mouth.   Yes [provider]  oxybutynin (DITROPAN-XL) 5 MG 24 hr tablet TAKE 1 TABLET BY MOUTH AT BEDTIME 11/02/16  Yes Leone Haven, MD  PARoxetine (PAXIL) 20 MG tablet TAKE ONE TABLET BY MOUTH ONCE DAILY 02/08/16  Yes Leone Haven, MD  rosuvastatin (CRESTOR) 20 MG tablet Take 1 tablet (20 mg total) by mouth daily. 09/14/16  Yes Leone Haven, MD  [facility] Wort 150 MG CAPS Take 1 capsule by mouth daily.   Yes [provider]  amLODipine (NORVASC) 10 MG tablet Take 1 tablet (10 mg total) by mouth daily. Patient not taking: Reported on 12/24/2016 09/12/16   Leone Haven, MD  FLUZONE HIGH-DOSE 0.5 ML injection  11/02/16   [provider]  KLOR-CON M20 20 MEQ tablet  12/19/16   [provider]  lovastatin (MEVACOR) 40 MG tablet  11/10/16  [provider]  meloxicam (MOBIC) 15 MG tablet Take 1 tablet (15 mg total) by mouth daily. Patient not taking: Reported on 12/24/2016 04/21/15   Rubbie Battiest, RN  naproxen (NAPROSYN) 500 MG tablet Take 1 tablet (500 mg total) by mouth 2 (two) times daily with a meal. Patient not taking: Reported on 12/24/2016 10/17/16   Burnard Hawthorne, FNP  potassium chloride (KLOR-CON) 20 MEQ packet Take 20 mEq 2 (two) times daily by mouth. Patient not taking: Reported on 12/24/2016 12/20/16   Jearld Fenton, NP  promethazine (PHENERGAN) 12.5 MG tablet Take 1 tablet (12.5 mg total) by mouth every 8 (eight) hours as needed for nausea or vomiting. Patient not taking: Reported on 12/24/2016  03/09/16   Coral Spikes, DO  triamterene-hydrochlorothiazide [facility]) 37.5-25 MG tablet  12/02/16   [provider]    Family History  Problem Relation Age of Onset  . Cancer Mother        ovarian  . Hypertension Mother   . Ovarian cancer Mother 59  . Heart disease Father   . Stroke Father   . Ovarian cancer Sister        ? dx cancer had hyst.  . Breast cancer Neg Hx      Social History   Tobacco Use  . Smoking status: Former Smoker    Last attempt to quit: 02/07/2012    Years since quitting: 4.8  . Smokeless tobacco: Never Used  Substance Use Topics  . Alcohol use: No    Alcohol/week: 0.0 oz  . Drug use: No    Allergies as of 12/24/2016  . (No Known Allergies)    Review of Systems:    All systems reviewed and negative except where noted in HPI.   Physical Exam:  Vital signs in last 24 hours: @VSRANGES @  Vitals reviewed  General:   Pleasant, cooperative in NAD Head:  Normocephalic and atraumatic. Eyes:   No icterus.   Conjunctiva pink. PERRLA. Ears:  Normal auditory acuity. Neck:  Supple; no masses or thyroidomegaly Lungs: Respirations even and unlabored. Lungs clear to auscultation bilaterally.   No wheezes, crackles, or rhonchi.  Heart:  Regular rate and rhythm;  Without murmur, clicks, rubs or gallops Abdomen:  Soft, nondistended, nontender. Normal bowel sounds. No appreciable masses or hepatomegaly.  No rebound or guarding.  Chest x-ray did not show any Neurologic:  Alert and oriented x3;  grossly normal neurologically. Skin:  Intact without significant lesions or rashes. Cervical Nodes:  No significant cervical adenopathy. Psych:  Alert and cooperative. Normal affect.  LAB RESULTS: No results for input(s): WBC, HGB, HCT, PLT in the last 72 hours. BMET No results for input(s): NA, K, CL, CO2, GLUCOSE, BUN, CREATININE, CALCIUM in the last 72 hours. LFT No results for input(s): PROT, ALBUMIN, AST, ALT, ALKPHOS, BILITOT, BILIDIR, IBILI in the  last 72 hours. PT/INR No results for input(s): LABPROT, INR in the last 72 hours. Recent labs reviewed from November 15 STUDIES: No results found. Chest x-ray does not show any masses.   Impression / Plan:   Anne [name] is a 66 y.o. y/o female with 40 pound weight loss over the last year with dysphagia and altered bowel habits  Patient symptoms are concerning for malignancy given her weight loss and dysphagia Constipation altered bowel habits and nausea vomiting are also concerning We will start with imaging of the chest and abdomen to evaluate her esophagus for any masses or thickening and evaluate for any gastric  outlet obstruction given her above symptoms. Depending on the results of the above studies can schedule EGD We will also obtain repeat blood work given her hypokalemia, will also check magnesium level If potassium is extremely low, we discussed with the patient that she might need to go to the ER for IV potassium supplementation.  As she is unable to take her pills Otherwise can talk to pharmacy about the availability of a crush form of potassium supplements. I encouraged patient to take Ensure or boost since she is not willing to take any solids or putting more mashed potato consistency foods No symptoms of food impaction at this time, would recommend soft diet until further evaluation with imaging and endoscopy  Thank you for involving me in the care of this patient.     Virgel Manifold, MD  12/24/2016, 9:49 AM

## 2016-12-25 ENCOUNTER — Telehealth: Payer: Self-pay

## 2016-12-25 NOTE — Telephone Encounter (Signed)
Received fax from pharmacy stating they will not cover the packets of potassium will cost pt over $100--please advise

## 2016-12-26 NOTE — Telephone Encounter (Signed)
Pt should follow up with her PCP to see what he would like to do since she is unable to tolerate the tablets.

## 2016-12-28 ENCOUNTER — Emergency Department
Admission: EM | Admit: 2016-12-28 | Discharge: 2016-12-28 | Disposition: A | Discharge status: Home / Self Care | Payer: PPO | Attending: Emergency Medicine | Admitting: Emergency Medicine

## 2016-12-28 ENCOUNTER — Other Ambulatory Visit: Payer: Self-pay

## 2016-12-28 ENCOUNTER — Encounter: Payer: Self-pay | Admitting: Emergency Medicine

## 2016-12-28 DIAGNOSIS — Z79899 Other long term (current) drug therapy: Secondary | ICD-10-CM | POA: Insufficient documentation

## 2016-12-28 DIAGNOSIS — K229 Disease of esophagus, unspecified: Secondary | ICD-10-CM | POA: Insufficient documentation

## 2016-12-28 DIAGNOSIS — R112 Nausea with vomiting, unspecified: Secondary | ICD-10-CM | POA: Diagnosis present

## 2016-12-28 DIAGNOSIS — I10 Essential (primary) hypertension: Secondary | ICD-10-CM | POA: Diagnosis not present

## 2016-12-28 DIAGNOSIS — Z87891 Personal history of nicotine dependence: Secondary | ICD-10-CM | POA: Insufficient documentation

## 2016-12-28 LAB — CBC
HEMATOCRIT: 40.1 % (ref 35.0–47.0)
HEMOGLOBIN: 13.3 g/dL (ref 12.0–16.0)
MCH: 26.8 pg (ref 26.0–34.0)
MCHC: 33.2 g/dL (ref 32.0–36.0)
MCV: 80.7 fL (ref 80.0–100.0)
Platelets: 227 10*3/uL (ref 150–440)
RBC: 4.97 MIL/uL (ref 3.80–5.20)
RDW: 15.3 % — ABNORMAL HIGH (ref 11.5–14.5)
WBC: 9 10*3/uL (ref 3.6–11.0)

## 2016-12-28 LAB — URINALYSIS, COMPLETE (UACMP) WITH MICROSCOPIC
BACTERIA UA: NONE SEEN
BILIRUBIN URINE: NEGATIVE
Glucose, UA: NEGATIVE mg/dL
HGB URINE DIPSTICK: NEGATIVE
Ketones, ur: NEGATIVE mg/dL
Nitrite: NEGATIVE
PROTEIN: 30 mg/dL — AB
SPECIFIC GRAVITY, URINE: 1.023 (ref 1.005–1.030)
pH: 5 (ref 5.0–8.0)

## 2016-12-28 LAB — COMPREHENSIVE METABOLIC PANEL
ALT: 17 U/L (ref 14–54)
AST: 31 U/L (ref 15–41)
Albumin: 4.1 g/dL (ref 3.5–5.0)
Alkaline Phosphatase: 93 U/L (ref 38–126)
Anion gap: 15 (ref 5–15)
BUN: 16 mg/dL (ref 6–20)
CHLORIDE: 90 mmol/L — AB (ref 101–111)
CO2: 27 mmol/L (ref 22–32)
Calcium: 10.2 mg/dL (ref 8.9–10.3)
Creatinine, Ser: 1.05 mg/dL — ABNORMAL HIGH (ref 0.44–1.00)
GFR, EST NON AFRICAN AMERICAN: 53 mL/min — AB (ref >60)
Glucose, Bld: 120 mg/dL — ABNORMAL HIGH (ref 65–99)
POTASSIUM: 2.9 mmol/L — AB (ref 3.5–5.1)
Sodium: 132 mmol/L — ABNORMAL LOW (ref 135–145)
Total Bilirubin: 1.4 mg/dL — ABNORMAL HIGH (ref 0.3–1.2)
Total Protein: 8.8 g/dL — ABNORMAL HIGH (ref 6.5–8.1)

## 2016-12-28 LAB — LIPASE, BLOOD: LIPASE: 52 U/L — AB (ref 11–51)

## 2016-12-28 MED ORDER — ONDANSETRON 4 MG PO TBDP
4.0000 mg | ORAL_TABLET | Freq: Once | ORAL | Status: AC | PRN
  Administered 2016-12-28: 4 mg via ORAL

## 2016-12-28 MED ORDER — ONDANSETRON 4 MG PO TBDP
ORAL_TABLET | ORAL | Status: AC
  Filled 2016-12-28: qty 1

## 2016-12-28 MED ORDER — SODIUM CHLORIDE 0.9 % IV SOLN
1000.0000 mL | Freq: Once | INTRAVENOUS | Status: AC
  Administered 2016-12-28: 1000 mL via INTRAVENOUS

## 2016-12-28 MED ORDER — POTASSIUM CHLORIDE 20 MEQ/15ML (10%) PO SOLN
20.0000 meq | Freq: Once | ORAL | Status: AC
  Administered 2016-12-28: 20 meq via ORAL
  Filled 2016-12-28: qty 15

## 2016-12-28 NOTE — ED Notes (Signed)
AAOx3.  Skin warm and dry.

## 2016-12-28 NOTE — ED Triage Notes (Addendum)
Arrives with c/o vomiting and diarrhea x 1 week.  Patient states she is only able to tolerate soda PO.  Patient states symptoms began about one month ago.  Is currently being seen by GI for same symptoms.  Has Upper GI scheduled for next Wednesday

## 2016-12-28 NOTE — ED Provider Notes (Signed)
[facility] Emergency Department Provider Note   ____________________________________________    I have reviewed the triage vital signs and the nursing notes.   HISTORY  Chief Complaint Emesis     HPI Anne [name] is a 66 y.o. female who presents with complaints of nausea and vomiting.  Patient reports over the last month she has felt a sensation of food getting "stuck "in the middle of her chest.  She is unable to get meats through, she has been able to tolerate some liquids.  She does have endoscopy scheduled for this upcoming week.  She presents today because she is frustrated at not being able to take in p.o.'s.  No significant diarrhea reported to me.  No abdominal pain.  Otherwise she feels well.   Past Medical History:  Diagnosis Date  . Arthritis   . Depression   . Hyperlipidemia   . Hypertension     Patient Active Problem List   Diagnosis Date Noted  . Weight loss 12/13/2016  . Callus of foot 07/27/2016  . Heart murmur 07/27/2016  . Right calf pain 04/20/2016  . Urge incontinence of urine 02/21/2016  . Chronic low back pain 08/15/2015  . Right shoulder pain 06/14/2015  . Benign essential HTN 02/22/2015  . HLD (hyperlipidemia) 02/22/2015  . Depression 02/22/2015    Past Surgical History:  Procedure Laterality Date  . APPENDECTOMY  1971  . CHOLECYSTECTOMY  1971  . COLONOSCOPY WITH PROPOFOL N/A 11/15/2016   Procedure: COLONOSCOPY WITH PROPOFOL;  Surgeon: Jonathon Bellows, MD;  Location: [facility] ENDOSCOPY;  Service: Gastroenterology;  Laterality: N/A;  . TONSILECTOMY, ADENOIDECTOMY, BILATERAL MYRINGOTOMY [place]    Prior to Admission medications   Medication Sig Start Date End Date Taking? Authorizing Provider  amLODipine (NORVASC) 10 MG tablet Take 1 tablet (10 mg total) by mouth daily. Patient not taking: Reported on 12/24/2016 09/12/16   Leone Haven, MD  cyclobenzaprine (FLEXERIL) 10 MG tablet TAKE 1 TABLET BY MOUTH  AT BEDTIME 12/03/16   Leone Haven, MD  DHA-Vitamin C-Lutein (EYE HEALTH FORMULA PO) Take by mouth.    [provider]  Fish Oil-Cholecalciferol (FISH OIL + D3) 1000-1000 MG-UNIT CAPS Take by mouth.    [provider]  FLUZONE HIGH-DOSE 0.5 ML injection  11/02/16   [provider]  KLOR-CON M20 20 MEQ tablet  12/19/16   [provider]  lovastatin (MEVACOR) 40 MG tablet  11/10/16   [provider]  meloxicam (MOBIC) 15 MG tablet Take 1 tablet (15 mg total) by mouth daily. Patient not taking: Reported on 12/24/2016 04/21/15   Rubbie Battiest, RN  naproxen (NAPROSYN) 500 MG tablet Take 1 tablet (500 mg total) by mouth 2 (two) times daily with a meal. Patient not taking: Reported on 12/24/2016 10/17/16   Burnard Hawthorne, FNP  oxybutynin (DITROPAN-XL) 5 MG 24 hr tablet TAKE 1 TABLET BY MOUTH AT BEDTIME 11/02/16   Leone Haven, MD  PARoxetine (PAXIL) 20 MG tablet TAKE ONE TABLET BY MOUTH ONCE DAILY 02/08/16   Leone Haven, MD  potassium chloride (KLOR-CON) 20 MEQ packet Take 20 mEq 2 (two) times daily by mouth. Patient not taking: Reported on 12/24/2016 12/20/16   Jearld Fenton, NP  promethazine (PHENERGAN) 12.5 MG tablet Take 1 tablet (12.5 mg total) by mouth every 8 (eight) hours as needed for nausea or vomiting. Patient not taking: Reported on 12/24/2016 03/09/16   Coral Spikes, DO  rosuvastatin (CRESTOR)  20 MG tablet Take 1 tablet (20 mg total) by mouth daily. 09/14/16   Leone Haven, MD  [facility] Wort 150 MG CAPS Take 1 capsule by mouth daily.    [provider]  triamterene-hydrochlorothiazide [facility]) 37.5-25 MG tablet  12/02/16   [provider]     Allergies Patient has no known allergies.  Family History  Problem Relation Age of Onset  . Cancer Mother        ovarian  . Hypertension Mother   . Ovarian cancer Mother 27  . Heart disease Father   . Stroke Father   . Ovarian cancer Sister        ?  dx cancer had hyst.  . Breast cancer Neg Hx     Social History Social History   Tobacco Use  . Smoking status: Former Smoker    Last attempt to quit: 02/07/2012    Years since quitting: 4.8  . Smokeless tobacco: Never Used  Substance Use Topics  . Alcohol use: No    Alcohol/week: 0.0 oz  . Drug use: No    Review of Systems  Constitutional: No fever/chills Eyes: No visual changes.  ENT: No sore throat. Cardiovascular: Denies chest pain. Respiratory: Denies shortness of breath. Gastrointestinal: As above Genitourinary: Negative for dysuria. Musculoskeletal: Negative for back pain. Skin: Negative for rash. Neurological: Negative for headaches   ____________________________________________   PHYSICAL EXAM:  VITAL SIGNS: ED Triage Vitals  Enc Vitals Group     BP 12/28/16 1238 118/69     Pulse Rate 12/28/16 1238 81     Resp 12/28/16 1238 16     Temp 12/28/16 1238 97.8 F (36.6 C)     Temp Source 12/28/16 1238 Oral     SpO2 12/28/16 1442 99 %     Weight 12/28/16 1236 80.3 kg (177 lb)     Height 12/28/16 1236 1.676 m (5\' 6" )     Head Circumference --      Peak Flow --      Pain Score 12/28/16 1234 0     Pain Loc --      Pain Edu? --      Excl. in [place]? --     Constitutional: Alert and oriented. No acute distress. Pleasant and interactive Eyes: Conjunctivae are normal.   Nose: No congestion/rhinnorhea. Mouth/Throat: Mucous membranes are moist.    Cardiovascular: Normal rate, regular rhythm.   Good peripheral circulation. Respiratory: Normal respiratory effort.  No retractions. . Gastrointestinal: Soft and nontender. No distention.   Genitourinary: deferred Musculoskeletal:  Warm and well perfused Neurologic:  Normal speech and language. No gross focal neurologic deficits are appreciated.  Skin:  Skin is warm, dry and intact. No rash noted. Psychiatric: Mood and affect are normal. Speech and behavior are normal.  ____________________________________________    LABS (all labs ordered are listed, but only abnormal results are displayed)  Labs Reviewed  LIPASE, BLOOD - Abnormal; Notable for the following components:      Result Value   Lipase 52 (*)    All other components within normal limits  COMPREHENSIVE METABOLIC PANEL - Abnormal; Notable for the following components:   Sodium 132 (*)    Potassium 2.9 (*)    Chloride 90 (*)    Glucose, Bld 120 (*)    Creatinine, Ser 1.05 (*)    Total Protein 8.8 (*)    Total Bilirubin 1.4 (*)    GFR calc non Af Amer 53 (*)    All other  components within normal limits  CBC - Abnormal; Notable for the following components:   RDW 15.3 (*)    All other components within normal limits  URINALYSIS, COMPLETE (UACMP) WITH MICROSCOPIC - Abnormal; Notable for the following components:   Color, Urine AMBER (*)    APPearance CLOUDY (*)    Protein, ur 30 (*)    Leukocytes, UA MODERATE (*)    Squamous Epithelial / LPF 6-30 (*)    All other components within normal limits   ____________________________________________  EKG  None ____________________________________________  RADIOLOGY  None ____________________________________________   PROCEDURES  Procedure(s) performed: No  Procedures   Critical Care performed: No ____________________________________________   INITIAL IMPRESSION / ASSESSMENT AND PLAN / ED COURSE  Pertinent labs & imaging results that were available during my care of the patient were reviewed by me and considered in my medical decision making (see chart for details).  Patient well-appearing and in no acute distress.  Description of symptoms is most consistent with likely esophageal ring.  Differential also includes achalasia or gastritis/GERD.  She has appropriate test scheduled as an outpatient this week.  She is mildly hyponatremic and hypokalemic, we will give IV fluids p.o. Potassium.  She is able to tolerate Ensure     ____________________________________________   FINAL CLINICAL IMPRESSION(S) / ED DIAGNOSES  Final diagnoses:  Esophageal abnormality        Note:  This document was prepared using Dragon voice recognition software and may include unintentional dictation errors.    Lavonia Drafts, MD 12/28/16 [phone]

## 2016-12-28 NOTE — ED Notes (Signed)
Says vomiting for about a month.  40 pound weight loss.  No abdominal pain.  Took zofran in lobby after vomiting, and has not vomited since but nausea continues.  Having runny bms when she goes.  No fever.

## 2017-01-01 NOTE — Telephone Encounter (Signed)
Please follow-up with patient to see how she is doing and see if she has been taking any potassium supplements since her ED visit last week.  Thanks.

## 2017-01-02 ENCOUNTER — Ambulatory Visit
Admission: RE | Admit: 2017-01-02 | Discharge: 2017-01-02 | Disposition: A | Discharge status: Home / Self Care | Payer: PPO | Source: Ambulatory Visit | Attending: Gastroenterology | Admitting: Gastroenterology

## 2017-01-02 ENCOUNTER — Ambulatory Visit: Admission: RE | Admit: 2017-01-02 | Payer: PPO | Source: Ambulatory Visit

## 2017-01-02 DIAGNOSIS — K2960 Other gastritis without bleeding: Secondary | ICD-10-CM | POA: Diagnosis not present

## 2017-01-02 DIAGNOSIS — K439 Ventral hernia without obstruction or gangrene: Secondary | ICD-10-CM | POA: Diagnosis present

## 2017-01-02 DIAGNOSIS — K222 Esophageal obstruction: Secondary | ICD-10-CM | POA: Diagnosis present

## 2017-01-02 DIAGNOSIS — Z9049 Acquired absence of other specified parts of digestive tract: Secondary | ICD-10-CM | POA: Diagnosis not present

## 2017-01-02 DIAGNOSIS — R634 Abnormal weight loss: Secondary | ICD-10-CM

## 2017-01-02 DIAGNOSIS — E860 Dehydration: Secondary | ICD-10-CM | POA: Diagnosis present

## 2017-01-02 DIAGNOSIS — K2970 Gastritis, unspecified, without bleeding: Secondary | ICD-10-CM | POA: Diagnosis not present

## 2017-01-02 DIAGNOSIS — K449 Diaphragmatic hernia without obstruction or gangrene: Secondary | ICD-10-CM | POA: Diagnosis present

## 2017-01-02 DIAGNOSIS — K7460 Unspecified cirrhosis of liver: Secondary | ICD-10-CM | POA: Diagnosis present

## 2017-01-02 DIAGNOSIS — R079 Chest pain, unspecified: Secondary | ICD-10-CM | POA: Diagnosis not present

## 2017-01-02 DIAGNOSIS — I700 Atherosclerosis of aorta: Secondary | ICD-10-CM | POA: Diagnosis present

## 2017-01-02 DIAGNOSIS — K209 Esophagitis, unspecified: Secondary | ICD-10-CM | POA: Diagnosis present

## 2017-01-02 DIAGNOSIS — I10 Essential (primary) hypertension: Secondary | ICD-10-CM | POA: Diagnosis present

## 2017-01-02 DIAGNOSIS — K469 Unspecified abdominal hernia without obstruction or gangrene: Secondary | ICD-10-CM | POA: Diagnosis present

## 2017-01-02 DIAGNOSIS — R197 Diarrhea, unspecified: Secondary | ICD-10-CM | POA: Diagnosis not present

## 2017-01-02 DIAGNOSIS — E785 Hyperlipidemia, unspecified: Secondary | ICD-10-CM | POA: Diagnosis present

## 2017-01-02 DIAGNOSIS — K2980 Duodenitis without bleeding: Secondary | ICD-10-CM | POA: Diagnosis present

## 2017-01-02 DIAGNOSIS — R1310 Dysphagia, unspecified: Secondary | ICD-10-CM | POA: Diagnosis not present

## 2017-01-02 DIAGNOSIS — Z87891 Personal history of nicotine dependence: Secondary | ICD-10-CM | POA: Diagnosis not present

## 2017-01-02 DIAGNOSIS — K2900 Acute gastritis without bleeding: Secondary | ICD-10-CM | POA: Diagnosis present

## 2017-01-02 DIAGNOSIS — R911 Solitary pulmonary nodule: Secondary | ICD-10-CM | POA: Diagnosis not present

## 2017-01-02 DIAGNOSIS — R918 Other nonspecific abnormal finding of lung field: Secondary | ICD-10-CM | POA: Diagnosis present

## 2017-01-02 DIAGNOSIS — R112 Nausea with vomiting, unspecified: Secondary | ICD-10-CM | POA: Diagnosis not present

## 2017-01-02 DIAGNOSIS — R1110 Vomiting, unspecified: Secondary | ICD-10-CM | POA: Diagnosis not present

## 2017-01-02 DIAGNOSIS — Z6828 Body mass index (BMI) 28.0-28.9, adult: Secondary | ICD-10-CM | POA: Diagnosis not present

## 2017-01-02 DIAGNOSIS — R4702 Dysphasia: Secondary | ICD-10-CM | POA: Diagnosis not present

## 2017-01-02 DIAGNOSIS — E43 Unspecified severe protein-calorie malnutrition: Secondary | ICD-10-CM | POA: Diagnosis present

## 2017-01-02 DIAGNOSIS — F329 Major depressive disorder, single episode, unspecified: Secondary | ICD-10-CM | POA: Diagnosis present

## 2017-01-02 DIAGNOSIS — E876 Hypokalemia: Secondary | ICD-10-CM | POA: Diagnosis present

## 2017-01-02 DIAGNOSIS — K2990 Gastroduodenitis, unspecified, without bleeding: Secondary | ICD-10-CM | POA: Diagnosis not present

## 2017-01-02 DIAGNOSIS — Z79899 Other long term (current) drug therapy: Secondary | ICD-10-CM | POA: Diagnosis not present

## 2017-01-02 MED ORDER — IOPAMIDOL (ISOVUE-300) INJECTION 61%
100.0000 mL | Freq: Once | INTRAVENOUS | Status: AC | PRN
  Administered 2017-01-02: 100 mL via INTRAVENOUS

## 2017-01-02 NOTE — Telephone Encounter (Signed)
Called pt. No answer. Did not leave message.

## 2017-01-02 NOTE — Telephone Encounter (Signed)
Left message to return call, ok for [facility] to speak to patient and see how she is doing

## 2017-01-03 ENCOUNTER — Inpatient Hospital Stay
Admission: EM | Admit: 2017-01-03 | Discharge: 2017-01-08 | DRG: 391 | Disposition: A | Discharge status: Home / Self Care | Payer: PPO | Attending: Internal Medicine | Admitting: Internal Medicine

## 2017-01-03 ENCOUNTER — Other Ambulatory Visit: Payer: Self-pay

## 2017-01-03 ENCOUNTER — Ambulatory Visit: Payer: PPO | Admitting: Family Medicine

## 2017-01-03 VITALS — Temp 97.4°F | Resp 20 | Wt 175.0 lb

## 2017-01-03 DIAGNOSIS — I10 Essential (primary) hypertension: Secondary | ICD-10-CM | POA: Diagnosis present

## 2017-01-03 DIAGNOSIS — Z87891 Personal history of nicotine dependence: Secondary | ICD-10-CM | POA: Diagnosis not present

## 2017-01-03 DIAGNOSIS — E43 Unspecified severe protein-calorie malnutrition: Secondary | ICD-10-CM | POA: Diagnosis present

## 2017-01-03 DIAGNOSIS — I700 Atherosclerosis of aorta: Secondary | ICD-10-CM | POA: Diagnosis present

## 2017-01-03 DIAGNOSIS — R112 Nausea with vomiting, unspecified: Secondary | ICD-10-CM | POA: Diagnosis not present

## 2017-01-03 DIAGNOSIS — E860 Dehydration: Secondary | ICD-10-CM | POA: Diagnosis present

## 2017-01-03 DIAGNOSIS — R4702 Dysphasia: Secondary | ICD-10-CM

## 2017-01-03 DIAGNOSIS — K2900 Acute gastritis without bleeding: Secondary | ICD-10-CM | POA: Diagnosis present

## 2017-01-03 DIAGNOSIS — Z79899 Other long term (current) drug therapy: Secondary | ICD-10-CM | POA: Diagnosis not present

## 2017-01-03 DIAGNOSIS — R918 Other nonspecific abnormal finding of lung field: Secondary | ICD-10-CM | POA: Diagnosis present

## 2017-01-03 DIAGNOSIS — K222 Esophageal obstruction: Secondary | ICD-10-CM | POA: Diagnosis present

## 2017-01-03 DIAGNOSIS — K2980 Duodenitis without bleeding: Secondary | ICD-10-CM | POA: Diagnosis present

## 2017-01-03 DIAGNOSIS — K469 Unspecified abdominal hernia without obstruction or gangrene: Secondary | ICD-10-CM | POA: Diagnosis present

## 2017-01-03 DIAGNOSIS — F329 Major depressive disorder, single episode, unspecified: Secondary | ICD-10-CM | POA: Diagnosis present

## 2017-01-03 DIAGNOSIS — K449 Diaphragmatic hernia without obstruction or gangrene: Secondary | ICD-10-CM | POA: Diagnosis present

## 2017-01-03 DIAGNOSIS — Z6828 Body mass index (BMI) 28.0-28.9, adult: Secondary | ICD-10-CM | POA: Diagnosis not present

## 2017-01-03 DIAGNOSIS — Z9049 Acquired absence of other specified parts of digestive tract: Secondary | ICD-10-CM | POA: Diagnosis not present

## 2017-01-03 DIAGNOSIS — R079 Chest pain, unspecified: Secondary | ICD-10-CM | POA: Insufficient documentation

## 2017-01-03 DIAGNOSIS — K2970 Gastritis, unspecified, without bleeding: Secondary | ICD-10-CM | POA: Diagnosis not present

## 2017-01-03 DIAGNOSIS — K439 Ventral hernia without obstruction or gangrene: Secondary | ICD-10-CM | POA: Diagnosis present

## 2017-01-03 DIAGNOSIS — K209 Esophagitis, unspecified: Secondary | ICD-10-CM | POA: Diagnosis present

## 2017-01-03 DIAGNOSIS — E785 Hyperlipidemia, unspecified: Secondary | ICD-10-CM | POA: Diagnosis present

## 2017-01-03 DIAGNOSIS — R634 Abnormal weight loss: Secondary | ICD-10-CM | POA: Diagnosis present

## 2017-01-03 DIAGNOSIS — K7460 Unspecified cirrhosis of liver: Secondary | ICD-10-CM | POA: Diagnosis present

## 2017-01-03 DIAGNOSIS — R1310 Dysphagia, unspecified: Secondary | ICD-10-CM | POA: Diagnosis not present

## 2017-01-03 DIAGNOSIS — E876 Hypokalemia: Secondary | ICD-10-CM

## 2017-01-03 DIAGNOSIS — K2990 Gastroduodenitis, unspecified, without bleeding: Secondary | ICD-10-CM | POA: Diagnosis not present

## 2017-01-03 DIAGNOSIS — R197 Diarrhea, unspecified: Secondary | ICD-10-CM | POA: Diagnosis not present

## 2017-01-03 LAB — CBC
HCT: 37.6 % (ref 35.0–47.0)
Hemoglobin: 12.7 g/dL (ref 12.0–16.0)
MCH: 27.2 pg (ref 26.0–34.0)
MCHC: 33.8 g/dL (ref 32.0–36.0)
MCV: 80.6 fL (ref 80.0–100.0)
PLATELETS: 225 10*3/uL (ref 150–440)
RBC: 4.66 MIL/uL (ref 3.80–5.20)
RDW: 15.2 % — AB (ref 11.5–14.5)
WBC: 8.5 10*3/uL (ref 3.6–11.0)

## 2017-01-03 LAB — COMPREHENSIVE METABOLIC PANEL
ALK PHOS: 81 U/L (ref 38–126)
ALT: 15 U/L (ref 14–54)
AST: 25 U/L (ref 15–41)
Albumin: 3.9 g/dL (ref 3.5–5.0)
Anion gap: 17 — ABNORMAL HIGH (ref 5–15)
BUN: 13 mg/dL (ref 6–20)
CHLORIDE: 90 mmol/L — AB (ref 101–111)
CO2: 27 mmol/L (ref 22–32)
CREATININE: 0.91 mg/dL (ref 0.44–1.00)
Calcium: 9.9 mg/dL (ref 8.9–10.3)
GFR calc Af Amer: >60 mL/min (ref >60)
Glucose, Bld: 103 mg/dL — ABNORMAL HIGH (ref 65–99)
Potassium: 2.4 mmol/L — CL (ref 3.5–5.1)
Sodium: 134 mmol/L — ABNORMAL LOW (ref 135–145)
Total Bilirubin: 1.2 mg/dL (ref 0.3–1.2)
Total Protein: 8 g/dL (ref 6.5–8.1)

## 2017-01-03 LAB — URINALYSIS, COMPLETE (UACMP) WITH MICROSCOPIC
Bacteria, UA: NONE SEEN
Bilirubin Urine: NEGATIVE
Glucose, UA: NEGATIVE mg/dL
Hgb urine dipstick: NEGATIVE
Ketones, ur: 5 mg/dL — AB
Nitrite: NEGATIVE
PH: 5 (ref 5.0–8.0)
Protein, ur: 100 mg/dL — AB
SPECIFIC GRAVITY, URINE: 1.039 — AB (ref 1.005–1.030)

## 2017-01-03 LAB — MAGNESIUM: MAGNESIUM: 1.7 mg/dL (ref 1.7–2.4)

## 2017-01-03 LAB — LIPASE, BLOOD: LIPASE: 44 U/L (ref 11–51)

## 2017-01-03 MED ORDER — DOCUSATE SODIUM 100 MG PO CAPS: 100.0000 mg | ORAL_CAPSULE | Freq: Two times a day (BID) | ORAL | Status: DC | PRN

## 2017-01-03 MED ORDER — ONDANSETRON HCL 4 MG/2ML IJ SOLN
4.0000 mg | Freq: Four times a day (QID) | INTRAMUSCULAR | Status: DC | PRN
  Administered 2017-01-03 – 2017-01-06 (×2): 4 mg via INTRAVENOUS
  Filled 2017-01-03 (×2): qty 2

## 2017-01-03 MED ORDER — ONDANSETRON HCL 4 MG/2ML IJ SOLN
4.0000 mg | Freq: Once | INTRAMUSCULAR | Status: AC
  Administered 2017-01-03: 4 mg via INTRAVENOUS
  Filled 2017-01-03: qty 2

## 2017-01-03 MED ORDER — AMLODIPINE BESYLATE 10 MG PO TABS
10.0000 mg | ORAL_TABLET | Freq: Every day | ORAL | Status: DC
  Administered 2017-01-03 – 2017-01-07 (×5): 10 mg via ORAL
  Filled 2017-01-03 (×6): qty 1

## 2017-01-03 MED ORDER — OXYBUTYNIN CHLORIDE ER 5 MG PO TB24
5.0000 mg | ORAL_TABLET | Freq: Every day | ORAL | Status: DC
  Administered 2017-01-04 – 2017-01-07 (×4): 5 mg via ORAL
  Filled 2017-01-03 (×5): qty 1

## 2017-01-03 MED ORDER — PAROXETINE HCL 20 MG PO TABS
20.0000 mg | ORAL_TABLET | Freq: Every day | ORAL | Status: DC
  Administered 2017-01-03 – 2017-01-08 (×6): 20 mg via ORAL
  Filled 2017-01-03 (×6): qty 1

## 2017-01-03 MED ORDER — ROSUVASTATIN CALCIUM 10 MG PO TABS
20.0000 mg | ORAL_TABLET | Freq: Every day | ORAL | Status: DC
  Administered 2017-01-03 – 2017-01-08 (×6): 20 mg via ORAL
  Filled 2017-01-03 (×6): qty 2

## 2017-01-03 MED ORDER — HEPARIN SODIUM (PORCINE) 5000 UNIT/ML IJ SOLN
5000.0000 [IU] | Freq: Three times a day (TID) | INTRAMUSCULAR | Status: DC
  Administered 2017-01-03 – 2017-01-08 (×12): 5000 [IU] via SUBCUTANEOUS
  Filled 2017-01-03 (×12): qty 1

## 2017-01-03 MED ORDER — SODIUM CHLORIDE 0.9 % IV SOLN
1000.0000 mL | Freq: Once | INTRAVENOUS | Status: AC
  Administered 2017-01-03: 1000 mL via INTRAVENOUS

## 2017-01-03 MED ORDER — POTASSIUM CHLORIDE 10 MEQ/100ML IV SOLN
10.0000 meq | INTRAVENOUS | Status: AC
  Administered 2017-01-03 (×2): 10 meq via INTRAVENOUS
  Filled 2017-01-03 (×2): qty 100

## 2017-01-03 MED ORDER — POTASSIUM CHLORIDE IN NACL 40-0.9 MEQ/L-% IV SOLN
INTRAVENOUS | Status: AC
  Administered 2017-01-03: 75 mL/h via INTRAVENOUS
  Filled 2017-01-03: qty 1000

## 2017-01-03 NOTE — Patient Instructions (Signed)
Please go to the emergency room for evaluation.

## 2017-01-03 NOTE — Progress Notes (Signed)
Tommi Rumps, MD Phone: [phone]  Anne [name] is a 66 y.o. female who presents today for a same-day visit.  Patient notes onset of vomiting yesterday.  She vomited 15 times.  Has vomited 3 times today.  She has had some centralized chest pressure following vomiting.  Emesis is nonbloody.  She notes no shortness of breath or radiation of the chest pressure.  No exertional component.  Describes it as feeling like somebody hit her in the chest.  She continues to have chest pain.  She does have some abdominal pain in the epigastric region.  She had CT imaging done yesterday that revealed a fairly large hernia in her right upper quadrant that contained part of her transverse colon though had no signs of incarceration or obstruction.  She feels ill.  She appears ill.  She has not been able to keep anything down.  She has not been able to take the potassium supplements previously prescribed.  Social History   Tobacco Use  Smoking Status Former Smoker  . Last attempt to quit: 02/07/2012  . Years since quitting: 4.9  Smokeless Tobacco Never Used     ROS see history of present illness  Objective  Physical Exam Vitals:   01/03/17 1211  Resp: 20  Temp: (!) 97.4 F (36.3 C)  SpO2: 99%    BP Readings from Last 3 Encounters:  12/28/16 128/89  12/24/16 122/66  12/20/16 122/80   Wt Readings from Last 3 Encounters:  01/03/17 175 lb (79.4 kg)  12/28/16 177 lb (80.3 kg)  12/24/16 177 lb 3.2 oz (80.4 kg)    Physical Exam  Constitutional: No distress.  Ill-appearing  HENT:  Head: Normocephalic and atraumatic.  Mucous membranes dry  Cardiovascular: Normal rate, regular rhythm and normal heart sounds.  Pulmonary/Chest: Effort normal and breath sounds normal.  Abdominal: Soft. She exhibits no distension. There is tenderness (epigastric tenderness). There is no rebound and no guarding.  Bowel sounds are difficult to appreciate  Neurological: She is alert. Gait normal.  Skin: Skin  is warm and dry. She is not diaphoretic.   EKG: Normal sinus rhythm, rate 77, no ischemic changes  Assessment/Plan: Please see individual problem list.  Chest pain Patient with relatively atypical chest pain.  Not exertional.  Potentially related to vomiting.  She has not been able to keep anything down recently.  Appears dehydrated.  She is orthostatic.  She had a CT scan yesterday that did reveal a hernia containing large bowel though at that time no signs of incarceration or obstruction.  Given the persistent emesis I am worried at this time more about possible obstruction.  EKG reassuring.  I am less concerned about cardiac cause.  Given chest pain, apparent dehydration, prior low potassium, and concern for obstruction we will have the patient evaluated in the emergency room for further fluids and lab work.  I did discuss EMS transport though patient wanted her husband to transport her.  LPN will call charge nurse and let them know the patient is on her way.   Diagnoses and all orders for this visit:  Chest pain, unspecified type -     EKG 12-Lead    Orders Placed This Encounter  Procedures  . EKG 12-Lead    Order Specific Question:   Where should this test be performed    Answer:   [facility]    No orders of the defined types were placed in this encounter.    Tommi Rumps, MD [facility] -  [employer]

## 2017-01-03 NOTE — Assessment & Plan Note (Addendum)
Patient with relatively atypical chest pain.  Not exertional.  Potentially related to vomiting.  She has not been able to keep anything down recently.  Appears dehydrated.  She is orthostatic.  She had a CT scan yesterday that did reveal a hernia containing large bowel though at that time no signs of incarceration or obstruction.  Given the persistent emesis I am worried at this time more about possible obstruction.  EKG reassuring.  I am less concerned about cardiac cause.  Given chest pain, apparent dehydration, prior low potassium, and concern for obstruction we will have the patient evaluated in the emergency room for further fluids and lab work.  I did discuss EMS transport though patient wanted her husband to transport her.  LPN will call charge nurse and let them know the patient is on her way.

## 2017-01-03 NOTE — Telephone Encounter (Signed)
Patient advised see office note dated 01/03/17.

## 2017-01-03 NOTE — Progress Notes (Signed)
Patient escorted to car by wheelchair/nurse/ notified ED charge patient vitals , statistics and orthostatic pressures.

## 2017-01-03 NOTE — H&P (Signed)
[facility] [place] NAME: Anne [name]    MR#:  [id number]  DATE OF BIRTH:  April 25, 1950  DATE OF ADMISSION:  01/03/2017  PRIMARY CARE PHYSICIAN: Leone Haven, MD   REQUESTING/REFERRING PHYSICIAN:   CHIEF COMPLAINT:   Chief Complaint  Patient presents with  . Emesis  . Chest Pain    HISTORY OF PRESENT ILLNESS: Anne [name]  is a 66 y.o. female with a known history of Arthritis, Htn, HLD- have persistent vomitign or 1 mth, multiple work up done by PMD, yesterday ordered CT abd- which showed a large hernea. Pt's K is also low. So PMD suggested to go to ER> She denies blood in vomit. No abd pain. No diarrhea. Not able to eat any or her meds for few weeks. Feels very weak.  PAST MEDICAL HISTORY:   Past Medical History:  Diagnosis Date  . Arthritis   . Depression   . Hyperlipidemia   . Hypertension     PAST SURGICAL HISTORY:  Past Surgical History:  Procedure Laterality Date  . APPENDECTOMY  1971  . CHOLECYSTECTOMY  1971  . COLONOSCOPY WITH PROPOFOL N/A 11/15/2016   Procedure: COLONOSCOPY WITH PROPOFOL;  Surgeon: Jonathon Bellows, MD;  Location: [facility] ENDOSCOPY;  Service: Gastroenterology;  Laterality: N/A;  . TONSILECTOMY, ADENOIDECTOMY, BILATERAL MYRINGOTOMY AND TUBES  1955    SOCIAL HISTORY:  Social History   Tobacco Use  . Smoking status: Former Smoker    Last attempt to quit: 02/07/2012    Years since quitting: 4.9  . Smokeless tobacco: Never Used  Substance Use Topics  . Alcohol use: No    Alcohol/week: 0.0 oz    FAMILY HISTORY:  Family History  Problem Relation Age of Onset  . Cancer Mother        ovarian  . Hypertension Mother   . Ovarian cancer Mother 80  . Heart disease Father   . Stroke Father   . Ovarian cancer Sister        ? dx cancer had hyst.  . Breast cancer Neg Hx     DRUG ALLERGIES: No Known Allergies  REVIEW OF SYSTEMS:   CONSTITUTIONAL: No fever, positive for fatigue or weakness.  EYES: No  blurred or double vision.  EARS, NOSE, AND THROAT: No tinnitus or ear pain.  RESPIRATORY: No cough, shortness of breath, wheezing or hemoptysis.  CARDIOVASCULAR: No chest pain, orthopnea, edema.  GASTROINTESTINAL: have nausea, vomiting,no diarrhea or abdominal pain.  GENITOURINARY: No dysuria, hematuria.  ENDOCRINE: No polyuria, nocturia,  HEMATOLOGY: No anemia, easy bruising or bleeding SKIN: No rash or lesion. MUSCULOSKELETAL: No joint pain or arthritis.   NEUROLOGIC: No tingling, numbness, weakness.  PSYCHIATRY: No anxiety or depression.   MEDICATIONS AT HOME:  Prior to Admission medications   Medication Sig Start Date End Date Taking? Authorizing Provider  amLODipine (NORVASC) 10 MG tablet Take 1 tablet (10 mg total) by mouth daily. 09/12/16  Yes Leone Haven, MD  DHA-Vitamin C-Lutein (EYE HEALTH FORMULA PO) Take by mouth.   Yes [provider]  Fish Oil-Cholecalciferol (FISH OIL + D3) 1000-1000 MG-UNIT CAPS Take by mouth.   Yes [provider]  meloxicam (MOBIC) 15 MG tablet Take 1 tablet (15 mg total) by mouth daily. 04/21/15  Yes Doss, Velora Heckler, RN  naproxen (NAPROSYN) 500 MG tablet Take 1 tablet (500 mg total) by mouth 2 (two) times daily with a meal. 10/17/16  Yes Arnett, Yvetta Coder, FNP  oxybutynin (DITROPAN-XL) 5 MG  24 hr tablet TAKE 1 TABLET BY MOUTH AT BEDTIME 11/02/16  Yes Leone Haven, MD  PARoxetine (PAXIL) 20 MG tablet TAKE ONE TABLET BY MOUTH ONCE DAILY 02/08/16  Yes Leone Haven, MD  potassium chloride (KLOR-CON) 20 MEQ packet Take 20 mEq 2 (two) times daily by mouth. 12/20/16  Yes Baity, Coralie Keens, NP  promethazine (PHENERGAN) 12.5 MG tablet Take 1 tablet (12.5 mg total) by mouth every 8 (eight) hours as needed for nausea or vomiting. 03/09/16  Yes Cook, Jayce G, DO  rosuvastatin (CRESTOR) 20 MG tablet Take 1 tablet (20 mg total) by mouth daily. 09/14/16  Yes Leone Haven, MD  [facility] Wort 150 MG CAPS Take 1 capsule by mouth daily.   Yes  [provider]  triamterene-hydrochlorothiazide (MAXZIDE-25) 37.5-25 MG tablet  12/02/16  Yes [provider]      PHYSICAL EXAMINATION:   VITAL SIGNS: Blood pressure 123/72, pulse 74, temperature 98.4 F (36.9 C), temperature source Oral, resp. rate 14, height 5\' 6"  (1.676 m), weight 79.4 kg (175 lb), SpO2 99 %.  GENERAL:  66 y.o.-year-old patient lying in the bed with no acute distress.  EYES: Pupils equal, round, reactive to light and accommodation. No scleral icterus. Extraocular muscles intact.  HEENT: Head atraumatic, normocephalic. Oropharynx and nasopharynx clear.  NECK:  Supple, no jugular venous distention. No thyroid enlargement, no tenderness.  LUNGS: Normal breath sounds bilaterally, no wheezing, rales,rhonchi or crepitation. No use of accessory muscles of respiration.  CARDIOVASCULAR: S1, S2 normal. No murmurs, rubs, or gallops.  ABDOMEN: Soft, nontender, nondistended. Bowel sounds present. No organomegaly or mass.  EXTREMITIES: No pedal edema, cyanosis, or clubbing.  NEUROLOGIC: Cranial nerves II through XII are intact. Muscle strength 5/5 in all extremities. Sensation intact. Gait not checked.  PSYCHIATRIC: The patient is alert and oriented x 3.  SKIN: No obvious rash, lesion, or ulcer.   LABORATORY PANEL:   CBC Recent Labs  Lab 12/28/16 1236 01/03/17 1242  WBC 9.0 8.5  HGB 13.3 12.7  HCT 40.1 37.6  PLT 227 225  MCV 80.7 80.6  MCH 26.8 27.2  MCHC 33.2 33.8  RDW 15.3* 15.2*   ------------------------------------------------------------------------------------------------------------------  Chemistries  Recent Labs  Lab 12/28/16 1236 01/03/17 1242  NA 132* 134*  K 2.9* 2.4*  CL 90* 90*  CO2 27 27  GLUCOSE 120* 103*  BUN 16 13  CREATININE 1.05* 0.91  CALCIUM 10.2 9.9  AST 31 25  ALT 17 15  ALKPHOS 93 81  BILITOT 1.4* 1.2    ------------------------------------------------------------------------------------------------------------------ estimated creatinine clearance is 62.9 mL/min (by C-G formula based on SCr of 0.91 mg/dL). ------------------------------------------------------------------------------------------------------------------ No results for input(s): TSH, T4TOTAL, T3FREE, THYROIDAB in the last 72 hours.  Invalid input(s): FREET3   Coagulation profile No results for input(s): INR, PROTIME in the last 168 hours. ------------------------------------------------------------------------------------------------------------------- No results for input(s): DDIMER in the last 72 hours. -------------------------------------------------------------------------------------------------------------------  Cardiac Enzymes No results for input(s): CKMB, TROPONINI, MYOGLOBIN in the last 168 hours.  Invalid input(s): CK ------------------------------------------------------------------------------------------------------------------ Invalid input(s): POCBNP  ---------------------------------------------------------------------------------------------------------------  Urinalysis    Component Value Date/Time   COLORURINE AMBER (A) 01/03/2017 1242   APPEARANCEUR CLOUDY (A) 01/03/2017 1242   LABSPEC 1.039 (H) 01/03/2017 1242   PHURINE 5.0 01/03/2017 1242   GLUCOSEU NEGATIVE 01/03/2017 1242   HGBUR NEGATIVE 01/03/2017 1242   BILIRUBINUR NEGATIVE 01/03/2017 1242   BILIRUBINUR negative 02/21/2016 0933   KETONESUR 5 (A) 01/03/2017 1242   PROTEINUR 100 (A) 01/03/2017 1242   UROBILINOGEN 0.2 02/21/2016  [place] 01/03/2017 1242   LEUKOCYTESUR MODERATE (A) 01/03/2017 1242     RADIOLOGY: Ct Chest W Contrast  Result Date: 01/02/2017 CLINICAL DATA:  Nausea, vomiting, weight loss. EXAM: CT CHEST, ABDOMEN, AND PELVIS WITH CONTRAST TECHNIQUE: Multidetector CT imaging of the chest, abdomen and  pelvis was performed following the standard protocol during bolus administration of intravenous contrast. CONTRAST:  159mL ISOVUE-300 IOPAMIDOL (ISOVUE-300) INJECTION 61% COMPARISON:  None. FINDINGS: CT CHEST FINDINGS Cardiovascular: Atherosclerosis of thoracic aorta is noted without aneurysm or dissection. No pericardial effusion is noted. Normal cardiac size. Coronary artery calcifications are noted. Mediastinum/Nodes: No enlarged mediastinal, hilar, or axillary lymph nodes. Thyroid gland, trachea, and esophagus demonstrate no significant findings. Lungs/Pleura: No pneumothorax or pleural effusion is noted. Right lung is clear. 5 mm nodule is noted in superior segment of left lower lobe best seen on image number 71 of series 4. Musculoskeletal: No chest wall mass or suspicious bone lesions identified. CT ABDOMEN PELVIS FINDINGS Hepatobiliary: Status post cholecystectomy. Mildly nodular hepatic margins are noted concerning for hepatic cirrhosis. No focal abnormality is noted. Pancreas: Unremarkable. No pancreatic ductal dilatation or surrounding inflammatory changes. Spleen: Normal in size without focal abnormality. Adrenals/Urinary Tract: Adrenal glands are unremarkable. Kidneys are normal, without renal calculi, focal lesion, or hydronephrosis. Bladder is unremarkable. Stomach/Bowel: The stomach appears normal. Status post appendectomy. There is no evidence of bowel obstruction or inflammation. There is noted a large hernia in the right upper quadrant of the abdomen which contains a loop of transverse colon, but does not result in incarceration or obstruction. Vascular/Lymphatic: Aortic atherosclerosis. No enlarged abdominal or pelvic lymph nodes. Reproductive: Uterus and bilateral adnexa are unremarkable. Other: No abnormal fluid collection is noted. Musculoskeletal: No acute or significant osseous findings. IMPRESSION: Atherosclerosis of thoracic and abdominal aorta is noted without aneurysm or dissection.  Large hernia is noted in right upper quadrant of abdomen which contains a loop of transverse colon, but does not result in incarceration or obstruction. 5 mm nodule is noted in superior segment of left lower lobe. No follow-up needed if patient is low-risk. Non-contrast chest CT can be considered in 12 months if patient is high-risk. This recommendation follows the consensus statement: Guidelines for Management of Incidental Pulmonary Nodules Detected on CT Images: From the Fleischner Society 2017; Radiology 2017; 284:228-243. Findings are concerning for hepatic cirrhosis. No other abnormality is noted. Electronically Signed   By: Marijo Conception, M.D.   On: 01/02/2017 14:54   Ct Abdomen Pelvis W Contrast  Result Date: 01/02/2017 CLINICAL DATA:  Nausea, vomiting, weight loss. EXAM: CT CHEST, ABDOMEN, AND PELVIS WITH CONTRAST TECHNIQUE: Multidetector CT imaging of the chest, abdomen and pelvis was performed following the standard protocol during bolus administration of intravenous contrast. CONTRAST:  142mL ISOVUE-300 IOPAMIDOL (ISOVUE-300) INJECTION 61% COMPARISON:  None. FINDINGS: CT CHEST FINDINGS Cardiovascular: Atherosclerosis of thoracic aorta is noted without aneurysm or dissection. No pericardial effusion is noted. Normal cardiac size. Coronary artery calcifications are noted. Mediastinum/Nodes: No enlarged mediastinal, hilar, or axillary lymph nodes. Thyroid gland, trachea, and esophagus demonstrate no significant findings. Lungs/Pleura: No pneumothorax or pleural effusion is noted. Right lung is clear. 5 mm nodule is noted in superior segment of left lower lobe best seen on image number 71 of series 4. Musculoskeletal: No chest wall mass or suspicious bone lesions identified. CT ABDOMEN PELVIS FINDINGS Hepatobiliary: Status post cholecystectomy. Mildly nodular hepatic margins are noted concerning for hepatic cirrhosis. No focal abnormality is noted. Pancreas: Unremarkable. No pancreatic ductal  dilatation or surrounding inflammatory changes. Spleen: Normal in size without focal abnormality. Adrenals/Urinary Tract: Adrenal glands are unremarkable. Kidneys are normal, without renal calculi, focal lesion, or hydronephrosis. Bladder is unremarkable. Stomach/Bowel: The stomach appears normal. Status post appendectomy. There is no evidence of bowel obstruction or inflammation. There is noted a large hernia in the right upper quadrant of the abdomen which contains a loop of transverse colon, but does not result in incarceration or obstruction. Vascular/Lymphatic: Aortic atherosclerosis. No enlarged abdominal or pelvic lymph nodes. Reproductive: Uterus and bilateral adnexa are unremarkable. Other: No abnormal fluid collection is noted. Musculoskeletal: No acute or significant osseous findings. IMPRESSION: Atherosclerosis of thoracic and abdominal aorta is noted without aneurysm or dissection. Large hernia is noted in right upper quadrant of abdomen which contains a loop of transverse colon, but does not result in incarceration or obstruction. 5 mm nodule is noted in superior segment of left lower lobe. No follow-up needed if patient is low-risk. Non-contrast chest CT can be considered in 12 months if patient is high-risk. This recommendation follows the consensus statement: Guidelines for Management of Incidental Pulmonary Nodules Detected on CT Images: From the Fleischner Society 2017; Radiology 2017; 284:228-243. Findings are concerning for hepatic cirrhosis. No other abnormality is noted. Electronically Signed   By: Marijo Conception, M.D.   On: 01/02/2017 14:54    EKG: Orders placed or performed in visit on 01/03/17  . EKG 12-Lead    IMPRESSION AND PLAN: * Severe hypokalemia   Due to GI loss, Vomiting   Check Mg   repalce IV   Monitor on tele.  * Intractable vomiting   GI consult.  * Large abd hernea   Surgery consult.  * Htn   Cont meds  * Hyperlipidemia    Cont statin.  All the  records are reviewed and case discussed with ED provider. Management plans discussed with the patient, family and they are in agreement.  CODE STATUS: Full. Code Status History    Date Active Date Inactive Code Status Order ID Comments User Context   11/25/2015 21:42 11/28/2015 09:32 Full Code [id number]  Lance Coon, MD Inpatient     Husband in room during my visit.  TOTAL TIME TAKING CARE OF THIS PATIENT: 45 minutes.    Vaughan Basta M.D on 01/03/2017   Between 7am to 6pm - Pager - [phone]  After 6pm go to [email or web address] - password [email or web address] [facility]  Office  [phone]  CC: Primary care physician; Leone Haven, MD   Note: This dictation was prepared with Dragon dictation along with smaller phrase technology. Any transcriptional errors that result from this process are unintentional.

## 2017-01-03 NOTE — Telephone Encounter (Signed)
Please advisek

## 2017-01-03 NOTE — ED Triage Notes (Signed)
Pt c/o vomiting for the past month , denies diarrhea, states she has not had a BM in 2 weeks. States she was seen here last week for same sx.. Pt was sent from PCP today for worsening sx, states she had vomiting x15 today.Marland Kitchen

## 2017-01-03 NOTE — Consult Note (Signed)
SURGICAL CONSULTATION NOTE (initial) - cpt: 99371  HISTORY OF PRESENT ILLNESS (HPI):  66 y.o. female presented to [facility] ED upon referral from her PMD for persistent hypokalemia associated with chronic dysphasia. She complains food she's been experiencing food ggetting stuck in her throat at the level of her mid-chest >several years that has worsened over the past 6 weeks to include liquids to the point she's been unable to tolerate much solid food or liquids by mouth. Patient reports she similarly presented to [facility] ED 1 week ago and was told she needed an endoscopy, but instead she underwent CT abdominal imaging, which demonstrated a large RUQ abdominal wall hernia containing transverse colon with no evidence of bowel obstruction. She denies any abdominal pain, though says she has not passed a BM or flatus x 1 month. Just prior to assessment, patient ate and tolerated some liquids and a flavored shaved ice, but now feels it stuck at her mid-chest. She otherwise denies fever/chills, CP, or SOB.  Surgery is consulted by medical physician Dr. Anselm Jungling in this context for evaluation and management of ventral hernia.  PAST MEDICAL HISTORY (PMH):  Past Medical History:  Diagnosis Date  . Arthritis   . Depression   . Hyperlipidemia   . Hypertension      PAST SURGICAL HISTORY ([place]):  Past Surgical History:  Procedure Laterality Date  . APPENDECTOMY  1971  . CHOLECYSTECTOMY  1971  . COLONOSCOPY WITH PROPOFOL N/A 11/15/2016   Procedure: COLONOSCOPY WITH PROPOFOL;  Surgeon: Jonathon Bellows, MD;  Location: [facility] ENDOSCOPY;  Service: Gastroenterology;  Laterality: N/A;  . TONSILECTOMY, ADENOIDECTOMY, BILATERAL MYRINGOTOMY AND TUBES  1955     MEDICATIONS:  Prior to Admission medications   Medication Sig Start Date End Date Taking? Authorizing Provider  amLODipine (NORVASC) 10 MG tablet Take 1 tablet (10 mg total) by mouth daily. 09/12/16  Yes Leone Haven, MD  DHA-Vitamin C-Lutein (EYE HEALTH FORMULA  PO) Take by mouth.   Yes [provider]  Fish Oil-Cholecalciferol (FISH OIL + D3) 1000-1000 MG-UNIT CAPS Take by mouth.   Yes [provider]  meloxicam (MOBIC) 15 MG tablet Take 1 tablet (15 mg total) by mouth daily. 04/21/15  Yes Doss, Velora Heckler, RN  naproxen (NAPROSYN) 500 MG tablet Take 1 tablet (500 mg total) by mouth 2 (two) times daily with a meal. 10/17/16  Yes Burnard Hawthorne, FNP  oxybutynin (DITROPAN-XL) 5 MG 24 hr tablet TAKE 1 TABLET BY MOUTH AT BEDTIME 11/02/16  Yes Leone Haven, MD  PARoxetine (PAXIL) 20 MG tablet TAKE ONE TABLET BY MOUTH ONCE DAILY 02/08/16  Yes Leone Haven, MD  potassium chloride (KLOR-CON) 20 MEQ packet Take 20 mEq 2 (two) times daily by mouth. 12/20/16  Yes Baity, Coralie Keens, NP  promethazine (PHENERGAN) 12.5 MG tablet Take 1 tablet (12.5 mg total) by mouth every 8 (eight) hours as needed for nausea or vomiting. 03/09/16  Yes Cook, Jayce G, DO  rosuvastatin (CRESTOR) 20 MG tablet Take 1 tablet (20 mg total) by mouth daily. 09/14/16  Yes Leone Haven, MD  [facility] Wort 150 MG CAPS Take 1 capsule by mouth daily.   Yes [provider]  triamterene-hydrochlorothiazide (MAXZIDE-25) 37.5-25 MG tablet  12/02/16  Yes [provider]     ALLERGIES:  No Known Allergies   SOCIAL HISTORY:  Social History   Socioeconomic History  . Marital status: Married    Spouse name: Not on file  . Number of children: Not on file  .  Years of education: Not on file  . Highest education level: Not on file  Social Needs  . Financial resource strain: Not on file  . Food insecurity - worry: Not on file  . Food insecurity - inability: Not on file  . Transportation needs - medical: Not on file  . Transportation needs - non-medical: Not on file  Occupational History  . Not on file  Tobacco Use  . Smoking status: Former Smoker    Last attempt to quit: 02/07/2012    Years since quitting: 4.9  . Smokeless tobacco: Never Used   Substance and Sexual Activity  . Alcohol use: No    Alcohol/week: 0.0 oz  . Drug use: No  . Sexual activity: Yes  Other Topics Concern  . Not on file  Social History Narrative   Married   Retired   Clinical cytogeneticist level of education    No children    1 cup of coffee    The patient currently resides (home / rehab facility / nursing home): Home The patient normally is (ambulatory / bedbound): Ambulatory   FAMILY HISTORY:  Family History  Problem Relation Age of Onset  . Cancer Mother        ovarian  . Hypertension Mother   . Ovarian cancer Mother 85  . Heart disease Father   . Stroke Father   . Ovarian cancer Sister        ? dx cancer had hyst.  . Breast cancer Neg Hx      REVIEW OF SYSTEMS:  Constitutional: denies weight loss, fever, chills, or sweats  Eyes: denies any other vision changes, history of eye injury  ENT: denies sore throat, hearing problems  Respiratory: denies shortness of breath, wheezing  Cardiovascular: denies chest pain, palpitations  Gastrointestinal: abdominal pain, N/V, and bowel function as per HPI Genitourinary: denies burning with urination or urinary frequency Musculoskeletal: denies any other joint pains or cramps  Skin: denies any other rashes or skin discolorations  Neurological: denies any other headache, dizziness, weakness  Psychiatric: denies any other depression, anxiety   All other review of systems were negative   VITAL SIGNS:  Temp:  [97.4 F (36.3 C)-98.4 F (36.9 C)] 97.5 F (36.4 C) (11/29 1700) Pulse Rate:  [73-88] 78 (11/29 1700) Resp:  [12-20] 16 (11/29 1700) BP: (119-128)/(71-79) 128/71 (11/29 1700) SpO2:  [97 %-100 %] 100 % (11/29 1700) Weight:  [175 lb (79.4 kg)] 175 lb (79.4 kg) (11/29 1239)     Height: 5\' 6"  (167.6 cm) Weight: 175 lb (79.4 kg) BMI (Calculated): 28.26   INTAKE/OUTPUT:  This shift: Total I/O In: 1000 [I.V.:1000] Out: -   Last 2 shifts: @IOLAST2SHIFTS @   PHYSICAL EXAM:  Constitutional:   -- Obese body habitus  -- Awake, alert, and oriented x3, no apparent distress Eyes:  -- Pupils equally round and reactive to light  -- No scleral icterus, B/L no occular discharge Ear, nose, throat: -- Neck is FROM WNL -- No jugular venous distension  Pulmonary:  -- No wheezes or rhales -- Equal breath sounds bilaterally -- Breathing non-labored at rest Cardiovascular:  -- S1, S2 present  -- No pericardial rubs  Gastrointestinal:  -- Abdomen obese, soft, completely nontender, and non-distended with no guarding or rebound tenderness -- Colon can be palpated subcutaneously over the patient's liver in the RUQ (also completely NT and ND) -- No abdominal masses appreciated, pulsatile or otherwise Musculoskeletal and Integumentary:  -- Wounds or skin discoloration: None appreciated -- Extremities: B/L  UE and LE FROM, hands and feet warm  Neurologic:  -- Motor function: Intact and symmetric -- Sensation: Intact and symmetric Psychiatric:  -- Mood and affect WNL  Labs:  CBC Latest Ref Rng & Units 01/03/2017 12/28/2016 12/20/2016  WBC 3.6 - 11.0 K/uL 8.5 9.0 10.8(H)  Hemoglobin 12.0 - 16.0 g/dL 12.7 13.3 13.0  Hematocrit 35.0 - 47.0 % 37.6 40.1 40.0  Platelets 150 - 440 K/uL 225 227 263.0   CMP Latest Ref Rng & Units 01/03/2017 12/28/2016 12/24/2016  Glucose 65 - 99 mg/dL 103(H) 120(H) 114(H)  BUN 6 - 20 mg/dL 13 16 16   Creatinine 0.44 - 1.00 mg/dL 0.91 1.05(H) 0.99  Sodium 135 - 145 mmol/L 134(L) 132(L) 133(L)  Potassium 3.5 - 5.1 mmol/L 2.4(LL) 2.9(L) 2.8(L)  Chloride 101 - 111 mmol/L 90(L) 90(L) 91(L)  CO2 22 - 32 mmol/L 27 27 27   Calcium 8.9 - 10.3 mg/dL 9.9 10.2 9.8  Total Protein 6.5 - 8.1 g/dL 8.0 8.8(H) 8.3(H)  Total Bilirubin 0.3 - 1.2 mg/dL 1.2 1.4(H) 1.4(H)  Alkaline Phos 38 - 126 U/L 81 93 93  AST 15 - 41 U/L 25 31 28   ALT 14 - 54 U/L 15 17 13(L)   Imaging studies:  CT Chest, Abdomen, and Pelvis with Contrast (01/02/2017) - personally reviewed at bedside with  patient Large hernia is noted in right upper quadrant of abdomen which contains a loop of transverse colon, but does not result in incarceration or obstruction.  5 mm nodule is noted in superior segment of left lower lobe. No follow-up needed if patient is low-risk. Non-contrast chest CT can be considered in 12 months if patient is high-risk. This recommendation follows the consensus statement: Guidelines for Management of Incidental Pulmonary Nodules Detected on CT Images: From the Fleischner Society 2017; Radiology 2017; 284:228-243.  Atherosclerosis of thoracic and abdominal aorta is noted without aneurysm or dissection.  Findings are concerning for hepatic cirrhosis.  No other abnormality is noted.  Assessment/Plan: (ICD-10's: K17.3) 66 y.o. female with dysphasia concerning for esophageal diverticulum vs achalasia and an asymptomatic large RUQ ventral hernia containing transverse colon without evidence of incarceration or obstruction, complicated by pertinent comorbidities including obesity, HTN, HLD, osteoarthritis, history of tobacco abuse (smoking), and major depression disorder.   - recommend GI constultation  - anticipate contrast esophogram and likely subsequent EGD   - outpatient surgical follow-up if ventral hernia becomes symptomatic  - medical management of comorbidities   - ambulation encouraged  - DVT prophylaxis  All of the above findings and recommendations were discussed with the patient and her husband, and all of patient's and her family's questions were answered to their expressed satisfaction.  Thank you for the opportunity to participate in this patient's care.   -- Marilynne Drivers Rosana Hoes, MD, [place]: [facility] - Partnering for exceptional care. Office: [phone]

## 2017-01-03 NOTE — ED Provider Notes (Signed)
[facility] Emergency Department Provider Note   ____________________________________________    I have reviewed the triage vital signs and the nursing notes.   HISTORY  Chief Complaint Emesis and Chest Pain     HPI Anne [name] is a 66 y.o. female who presents with nausea and vomiting.  Patient reports this has been going on for several weeks now.  She is unable to tolerate p.o.'s.  She rapidly gets nausea and vomiting if she tries to eat anything.  Saw her PCP today after a CT scan who referred her to the emergency department.  She denies abdominal pain to me.  I saw this patient in the emergency department approximately 1 week ago for similar complaints, I thought she was having an endoscopy afterwards but apparently was a CT scan.  Patient denies chest pain to me   Past Medical History:  Diagnosis Date  . Arthritis   . Depression   . Hyperlipidemia   . Hypertension     Patient Active Problem List   Diagnosis Date Noted  . Chest pain 01/03/2017  . Weight loss 12/13/2016  . Callus of foot 07/27/2016  . Heart murmur 07/27/2016  . Right calf pain 04/20/2016  . Urge incontinence of urine 02/21/2016  . Chronic low back pain 08/15/2015  . Right shoulder pain 06/14/2015  . Benign essential HTN 02/22/2015  . HLD (hyperlipidemia) 02/22/2015  . Depression 02/22/2015    Past Surgical History:  Procedure Laterality Date  . APPENDECTOMY  1971  . CHOLECYSTECTOMY  1971  . COLONOSCOPY WITH PROPOFOL N/A 11/15/2016   Procedure: COLONOSCOPY WITH PROPOFOL;  Surgeon: Jonathon Bellows, MD;  Location: [facility] ENDOSCOPY;  Service: Gastroenterology;  Laterality: N/A;  . TONSILECTOMY, ADENOIDECTOMY, BILATERAL MYRINGOTOMY [place]    Prior to Admission medications   Medication Sig Start Date End Date Taking? Authorizing Provider  amLODipine (NORVASC) 10 MG tablet Take 1 tablet (10 mg total) by mouth daily. 09/12/16  Yes Leone Haven, MD  DHA-Vitamin  C-Lutein (EYE HEALTH FORMULA PO) Take by mouth.   Yes [provider]  Fish Oil-Cholecalciferol (FISH OIL + D3) 1000-1000 MG-UNIT CAPS Take by mouth.   Yes [provider]  meloxicam (MOBIC) 15 MG tablet Take 1 tablet (15 mg total) by mouth daily. 04/21/15  Yes Doss, Velora Heckler, RN  naproxen (NAPROSYN) 500 MG tablet Take 1 tablet (500 mg total) by mouth 2 (two) times daily with a meal. 10/17/16  Yes Burnard Hawthorne, FNP  oxybutynin (DITROPAN-XL) 5 MG 24 hr tablet TAKE 1 TABLET BY MOUTH AT BEDTIME 11/02/16  Yes Leone Haven, MD  PARoxetine (PAXIL) 20 MG tablet TAKE ONE TABLET BY MOUTH ONCE DAILY 02/08/16  Yes Leone Haven, MD  potassium chloride (KLOR-CON) 20 MEQ packet Take 20 mEq 2 (two) times daily by mouth. 12/20/16  Yes Baity, Coralie Keens, NP  promethazine (PHENERGAN) 12.5 MG tablet Take 1 tablet (12.5 mg total) by mouth every 8 (eight) hours as needed for nausea or vomiting. 03/09/16  Yes Cook, Jayce G, DO  rosuvastatin (CRESTOR) 20 MG tablet Take 1 tablet (20 mg total) by mouth daily. 09/14/16  Yes Leone Haven, MD  [facility] Wort 150 MG CAPS Take 1 capsule by mouth daily.   Yes [provider]  triamterene-hydrochlorothiazide (MAXZIDE-25) 37.5-25 MG tablet  12/02/16  Yes [provider]     Allergies Patient has no known allergies.  Family History  Problem Relation Age of Onset  .  Cancer Mother        ovarian  . Hypertension Mother   . Ovarian cancer Mother 63  . Heart disease Father   . Stroke Father   . Ovarian cancer Sister        ? dx cancer had hyst.  . Breast cancer Neg Hx     Social History Social History   Tobacco Use  . Smoking status: Former Smoker    Last attempt to quit: 02/07/2012    Years since quitting: 4.9  . Smokeless tobacco: Never Used  Substance Use Topics  . Alcohol use: No    Alcohol/week: 0.0 oz  . Drug use: No    Review of Systems  Constitutional: No fever/chills Eyes: No visual changes.  ENT: No  sore throat. Cardiovascular: Denies chest pain. Respiratory: Denies shortness of breath. Gastrointestinal: No abdominal pain.  Nausea and vomiting as above Genitourinary: Negative for dysuria. Musculoskeletal: Negative for back pain. Skin: Negative for rash. Neurological: Negative for headaches or weakness   ____________________________________________   PHYSICAL EXAM:  VITAL SIGNS: ED Triage Vitals [01/03/17 1239]  Enc Vitals Group     BP 119/75     Pulse Rate 88     Resp 18     Temp 98.4 F (36.9 C)     Temp Source Oral     SpO2 100 %     Weight 79.4 kg (175 lb)     Height 1.676 m (5\' 6" )     Head Circumference      Peak Flow      Pain Score 0     Pain Loc      Pain Edu?      Excl. in [place]?     Constitutional: Alert and oriented. No acute distress. Pleasant and interactive Eyes: Conjunctivae are normal.   Nose: No congestion/rhinnorhea. Mouth/Throat: Mucous membranes are moist.    Cardiovascular: Normal rate, regular rhythm. Grossly normal heart sounds.  Good peripheral circulation. Respiratory: Normal respiratory effort.  No retractions. Lungs CTAB. Gastrointestinal: Soft and nontender. No distention.  No CVA tenderness.  Musculoskeletal:   Warm and well perfused Neurologic:  Normal speech and language. No gross focal neurologic deficits are appreciated.  Skin:  Skin is warm, dry and intact. No rash noted. Psychiatric: Mood and affect are normal. Speech and behavior are normal.  ____________________________________________   LABS (all labs ordered are listed, but only abnormal results are displayed)  Labs Reviewed  COMPREHENSIVE METABOLIC PANEL - Abnormal; Notable for the following components:      Result Value   Sodium 134 (*)    Potassium 2.4 (*)    Chloride 90 (*)    Glucose, Bld 103 (*)    Anion gap 17 (*)    All other components within normal limits  CBC - Abnormal; Notable for the following components:   RDW 15.2 (*)    All other components  within normal limits  URINALYSIS, COMPLETE (UACMP) WITH MICROSCOPIC - Abnormal; Notable for the following components:   Color, Urine AMBER (*)    APPearance CLOUDY (*)    Specific Gravity, Urine 1.039 (*)    Ketones, ur 5 (*)    Protein, ur 100 (*)    Leukocytes, UA MODERATE (*)    Squamous Epithelial / LPF 6-30 (*)    All other components within normal limits  LIPASE, BLOOD   ____________________________________________  EKG  None ____________________________________________  RADIOLOGY  Reviewed CT scan from several days ago ____________________________________________   PROCEDURES  Procedure(s)  performed: No  Procedures   Critical Care performed: No ____________________________________________   INITIAL IMPRESSION / ASSESSMENT AND PLAN / ED COURSE  Pertinent labs & imaging results that were available during my care of the patient were reviewed by me and considered in my medical decision making (see chart for details).  Patient with continued nausea and vomiting.  Will treat with IV fluids and IV Zofran and will replete potassium as she has Electra light abnormalities.  Given that she will require admission for further management.  Discussed with hospitalist for further treatment    ____________________________________________   FINAL CLINICAL IMPRESSION(S) / ED DIAGNOSES  Final diagnoses:  Dehydration  Acute hypokalemia  Acute gastritis without hemorrhage, unspecified gastritis type        Note:  This document was prepared using Dragon voice recognition software and may include unintentional dictation errors.    Lavonia Drafts, MD 01/03/17 [phone]

## 2017-01-04 DIAGNOSIS — R112 Nausea with vomiting, unspecified: Secondary | ICD-10-CM

## 2017-01-04 LAB — CBC
HCT: 33.9 % — ABNORMAL LOW (ref 35.0–47.0)
Hemoglobin: 11.2 g/dL — ABNORMAL LOW (ref 12.0–16.0)
MCH: 27 pg (ref 26.0–34.0)
MCHC: 33.1 g/dL (ref 32.0–36.0)
MCV: 81.6 fL (ref 80.0–100.0)
Platelets: 170 10*3/uL (ref 150–440)
RBC: 4.15 MIL/uL (ref 3.80–5.20)
RDW: 15.5 % — ABNORMAL HIGH (ref 11.5–14.5)
WBC: 6.5 10*3/uL (ref 3.6–11.0)

## 2017-01-04 LAB — FERRITIN: FERRITIN: 417 ng/mL — AB (ref 11–307)

## 2017-01-04 LAB — BASIC METABOLIC PANEL
Anion gap: 11 (ref 5–15)
BUN: 13 mg/dL (ref 6–20)
CO2: 25 mmol/L (ref 22–32)
Calcium: 8.8 mg/dL — ABNORMAL LOW (ref 8.9–10.3)
Chloride: 100 mmol/L — ABNORMAL LOW (ref 101–111)
Creatinine, Ser: 0.78 mg/dL (ref 0.44–1.00)
GFR calc Af Amer: >60 mL/min (ref >60)
GFR calc non Af Amer: >60 mL/min (ref >60)
Glucose, Bld: 105 mg/dL — ABNORMAL HIGH (ref 65–99)
Potassium: 2.6 mmol/L — CL (ref 3.5–5.1)
Sodium: 136 mmol/L (ref 135–145)

## 2017-01-04 LAB — IRON AND TIBC
Iron: 55 ug/dL (ref 28–170)
Saturation Ratios: 27 % (ref 10.4–31.8)
TIBC: 205 ug/dL — ABNORMAL LOW (ref 250–450)
UIBC: 150 ug/dL

## 2017-01-04 LAB — FOLATE: Folate: 3.5 ng/mL — ABNORMAL LOW (ref >5.9)

## 2017-01-04 LAB — VITAMIN B12: Vitamin B-12: 257 pg/mL (ref 180–914)

## 2017-01-04 MED ORDER — POTASSIUM CHLORIDE CRYS ER 20 MEQ PO TBCR
40.0000 meq | EXTENDED_RELEASE_TABLET | Freq: Once | ORAL | Status: AC
  Administered 2017-01-04: 40 meq via ORAL
  Filled 2017-01-04: qty 2

## 2017-01-04 MED ORDER — POTASSIUM CHLORIDE IN NACL 20-0.9 MEQ/L-% IV SOLN
INTRAVENOUS | Status: DC
  Administered 2017-01-04 – 2017-01-06 (×4): via INTRAVENOUS
  Filled 2017-01-04 (×6): qty 1000

## 2017-01-04 MED ORDER — PROCHLORPERAZINE EDISYLATE 5 MG/ML IJ SOLN
10.0000 mg | Freq: Four times a day (QID) | INTRAMUSCULAR | Status: DC | PRN
  Administered 2017-01-04: 10 mg via INTRAVENOUS
  Filled 2017-01-04 (×3): qty 2

## 2017-01-04 MED ORDER — MAGNESIUM SULFATE 2 GM/50ML IV SOLN
2.0000 g | Freq: Once | INTRAVENOUS | Status: AC
  Administered 2017-01-04: 2 g via INTRAVENOUS
  Filled 2017-01-04: qty 50

## 2017-01-04 NOTE — Progress Notes (Signed)
Patient ID: Anne [name], female   DOB: May 23, 1950, 66 y.o.   MRN: [id number]  [place] PROGRESS NOTE  Anne [name]:[id number] DOB: 11/07/1950 DOA: 01/03/2017 PCP: Leone Haven, MD  HPI/Subjective: Patient has had nausea vomiting for over a month.  Husband thinks it has worsened since the patient had a colonoscopy.  Patient not having any abdominal pain.  Feels weak.  Last night had diarrhea with the liquid diet.  Objective: Vitals:   01/04/17 0942 01/04/17 1207  BP: 114/68 120/67  Pulse:  78  Resp:    Temp:  (!) 97.4 F (36.3 C)  SpO2:  94%    Filed Weights   01/03/17 1239  Weight: 79.4 kg (175 lb)    ROS: Review of Systems  Constitutional: Negative for chills and fever.  Eyes: Negative for blurred vision.  Respiratory: Negative for cough and shortness of breath.   Cardiovascular: Negative for chest pain.  Gastrointestinal: Positive for diarrhea, nausea and vomiting. Negative for abdominal pain and constipation.  Genitourinary: Negative for dysuria.  Musculoskeletal: Negative for joint pain.  Neurological: Negative for dizziness and headaches.   Exam: Physical Exam  Constitutional: She is oriented to person, place, and time.  HENT:  Nose: No mucosal edema.  Mouth/Throat: No oropharyngeal exudate or posterior oropharyngeal edema.  Eyes: Conjunctivae, EOM and lids are normal. Pupils are equal, round, and reactive to light.  Neck: No JVD present. Carotid bruit is not present. No edema present. No thyroid mass and no thyromegaly present.  Cardiovascular: S1 normal and S2 normal. Exam reveals no gallop.  No murmur heard. Pulses:      Dorsalis pedis pulses are 2+ on the right side, and 2+ on the left side.  Respiratory: No respiratory distress. She has no wheezes. She has no rhonchi. She has no rales.  GI: Soft. Bowel sounds are normal. There is no tenderness. A hernia is present. Hernia confirmed positive in the ventral area.  Musculoskeletal:   Right ankle: She exhibits no swelling.       Left ankle: She exhibits no swelling.  Lymphadenopathy:    She has no cervical adenopathy.  Neurological: She is alert and oriented to person, place, and time. No cranial nerve deficit.  Skin: Skin is warm. No rash noted. Nails show no clubbing.  Psychiatric: She has a normal mood and affect.      Data Reviewed: Basic Metabolic Panel: Recent Labs  Lab 01/03/17 1242 01/04/17 0432  NA 134* 136  K 2.4* 2.6*  CL 90* 100*  CO2 27 25  GLUCOSE 103* 105*  BUN 13 13  CREATININE 0.91 0.78  CALCIUM 9.9 8.8*  MG 1.7  --    Liver Function Tests: Recent Labs  Lab 01/03/17 1242  AST 25  ALT 15  ALKPHOS 81  BILITOT 1.2  PROT 8.0  ALBUMIN 3.9   Recent Labs  Lab 01/03/17 1242  LIPASE 44   CBC: Recent Labs  Lab 01/03/17 1242 01/04/17 0432  WBC 8.5 6.5  HGB 12.7 11.2*  HCT 37.6 33.9*  MCV 80.6 81.6  PLT 225 170    Scheduled Meds: . amLODipine  10 mg Oral Daily  . heparin  5,000 Units Subcutaneous Q8H  . oxybutynin  5 mg Oral QHS  . PARoxetine  20 mg Oral Daily  . rosuvastatin  20 mg Oral Daily   Continuous Infusions: . 0.9 % NaCl with KCl 20 mEq / L 75 mL/hr at 01/04/17 0825    Assessment/Plan:  1.  Hypokalemia.  Patient still with low potassium today.  Patient takes Maxide at home which is the likely culprit along with vomiting over the past month.  Stop Maxide.  IV fluids with potassium. 2. Hypomagnesemia replace magnesium also. 3. Nausea vomiting.  Symptomatic management with nausea medications.  Stop medications that can irritate the stomach including Naprosyn and meloxicam.  Gastroenterology commented on endoscopy as outpatient 4. 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  Check hepatitis profiles. 5. Hyperlipidemia unspecified on Crestor 6. Essential hypertension on Norvasc 7. Depression on Paxil 8. Microcytic anemia GI ordered iron studies. 9. Right upper quadrant ventral hernia.  Seen by surgery.  Code Status:     Code  Status Orders  (From admission, onward)        Start     Ordered   01/03/17 1654  Full code  Continuous     01/03/17 1653    Code Status History    Date Active Date Inactive Code Status Order ID Comments User Context   11/25/2015 21:42 11/28/2015 09:32 Full Code [id number]  Lance Coon, MD Inpatient     Family Communication: Husband at the bedside Disposition Plan: Hopefully be able to go home soon  Consultants:  General surgery  Gastroenterology  Time spent: 28 minutes  [place]

## 2017-01-04 NOTE — Progress Notes (Signed)
Dr Vicente Males gave an order for a clear liquid diet that can be advanced as tolerated

## 2017-01-04 NOTE — Consult Note (Signed)
Jonathon Bellows , MD [address], [place], [place], [place], [address] [id number] [place], [place], [place], [address] Phone: [phone]  Fax: [phone]  Consultation  Referring Provider: Dr Leslye Peer  Primary Care Physician:  Leone Haven, MD Primary Gastroenterologist:  Dr. Bonna Gains          Reason for Consultation:     Nausea and vomiting   Date of Admission:  01/03/2017 Date of Consultation:  01/04/2017         HPI:   Anne [name] is a 66 y.o. female who is previously known to our practice and she was seen by my colleague Dr. Bonna Gains on 12/24/2016 for dysphagia and weight loss.  Dysphagia had began a year back and had worsened over the last month.  Weight loss of 40 pounds over the past 1 year.  Colonoscopy in October 2018 by myself showed that she had 3 polyps taken out less than 10 mm in size and 2 of them with adenomatous tissue.  On 01/02/2017 she underwent a CT scan of the abdomen chest and pelvis which showed a large  hernia that contained a loop of the transverse colon with no incarceration or obstruction.  5 mm nodule in the left lobe of the lung.  Features suggestive of liver cirrhosis.  She presented to the hospital yesterday at 3:30 PM with persistent vomiting of one month in duration and felt very weak.  Dr. Rosana Hoes from surgery was consulted for the antral hernia seen on the CT scan suggested further evaluation as an outpatient.  The patient had recently presented to the ER with dysphagia.  On admission she had a low potassium of 2.4 with a sodium of 134 and and a low chloride at 90.  This morning the potassium persistently still at 2.6.  The magnesium was 1.7 yesterday.  Hemoglobin is 11.2 g and check this morning  with MCV of81.6.   She says she has had issues with swallowing for 2 moths, gets stuck in her neck , does not affect liquids, lost weight , recurrent vomiting last two months, nonce since hospital admission.,   Past Medical History:  Diagnosis  Date  . Arthritis   . Depression   . Hyperlipidemia   . Hypertension     Past Surgical History:  Procedure Laterality Date  . APPENDECTOMY  1971  . CHOLECYSTECTOMY  1971  . COLONOSCOPY WITH PROPOFOL N/A 11/15/2016   Procedure: COLONOSCOPY WITH PROPOFOL;  Surgeon: Jonathon Bellows, MD;  Location: [facility] ENDOSCOPY;  Service: Gastroenterology;  Laterality: N/A;  . TONSILECTOMY, ADENOIDECTOMY, BILATERAL MYRINGOTOMY [place]    Prior to Admission medications   Medication Sig Start Date End Date Taking? Authorizing Provider  amLODipine (NORVASC) 10 MG tablet Take 1 tablet (10 mg total) by mouth daily. 09/12/16  Yes Leone Haven, MD  DHA-Vitamin C-Lutein (EYE HEALTH FORMULA PO) Take by mouth.   Yes [provider]  Fish Oil-Cholecalciferol (FISH OIL + D3) 1000-1000 MG-UNIT CAPS Take by mouth.   Yes [provider]  meloxicam (MOBIC) 15 MG tablet Take 1 tablet (15 mg total) by mouth daily. 04/21/15  Yes Doss, Velora Heckler, RN  naproxen (NAPROSYN) 500 MG tablet Take 1 tablet (500 mg total) by mouth 2 (two) times daily with a meal. 10/17/16  Yes Burnard Hawthorne, FNP  oxybutynin (DITROPAN-XL) 5 MG 24 hr tablet TAKE 1 TABLET BY MOUTH AT BEDTIME 11/02/16  Yes Leone Haven, MD  PARoxetine (PAXIL) 20 MG tablet  TAKE ONE TABLET BY MOUTH ONCE DAILY 02/08/16  Yes Leone Haven, MD  potassium chloride (KLOR-CON) 20 MEQ packet Take 20 mEq 2 (two) times daily by mouth. 12/20/16  Yes Baity, Coralie Keens, NP  promethazine (PHENERGAN) 12.5 MG tablet Take 1 tablet (12.5 mg total) by mouth every 8 (eight) hours as needed for nausea or vomiting. 03/09/16  Yes Cook, Jayce G, DO  rosuvastatin (CRESTOR) 20 MG tablet Take 1 tablet (20 mg total) by mouth daily. 09/14/16  Yes Leone Haven, MD  [facility] Wort 150 MG CAPS Take 1 capsule by mouth daily.   Yes [provider]  triamterene-hydrochlorothiazide (MAXZIDE-25) 37.5-25 MG tablet  12/02/16  Yes [provider]    Family  History  Problem Relation Age of Onset  . Cancer Mother        ovarian  . Hypertension Mother   . Ovarian cancer Mother 35  . Heart disease Father   . Stroke Father   . Ovarian cancer Sister        ? dx cancer had hyst.  . Breast cancer Neg Hx      Social History   Tobacco Use  . Smoking status: Former Smoker    Last attempt to quit: 02/07/2012    Years since quitting: 4.9  . Smokeless tobacco: Never Used  Substance Use Topics  . Alcohol use: No    Alcohol/week: 0.0 oz  . Drug use: No    Allergies as of 01/03/2017  . (No Known Allergies)    Review of Systems:    All systems reviewed and negative except where noted in HPI.   Physical Exam:  Vital signs in last 24 hours: Temp:  [97.4 F (36.3 C)-98.4 F (36.9 C)] 97.5 F (36.4 C) (11/30 0444) Pulse Rate:  [73-88] 82 (11/30 0444) Resp:  [12-20] 18 (11/30 0444) BP: (119-128)/(62-79) 122/62 (11/30 0444) SpO2:  [93 %-100 %] 93 % (11/30 0444) Weight:  [175 lb (79.4 kg)] 175 lb (79.4 kg) (11/29 1239) Last BM Date: 01/03/17 General:   Pleasant, cooperative in NAD Head:  Normocephalic and atraumatic. Eyes:   No icterus.   Conjunctiva pink. PERRLA. Ears:  Normal auditory acuity. Neck:  Supple; no masses or thyroidomegaly Lungs: Respirations even and unlabored. Lungs clear to auscultation bilaterally.   No wheezes, crackles, or rhonchi.  Heart:  Regular rate and rhythm;  Without murmur, clicks, rubs or gallops Abdomen:  Soft, nondistended, nontender. Normal bowel sounds. No appreciable masses or hepatomegaly.  No rebound or guarding.  Neurologic:  Alert and oriented x3;  grossly normal neurologically. Skin:  Intact without significant lesions or rashes. Cervical Nodes:  No significant cervical adenopathy. Psych:  Alert and cooperative. Normal affect.  LAB RESULTS: Recent Labs    01/03/17 1242 01/04/17 0432  WBC 8.5 6.5  HGB 12.7 11.2*  HCT 37.6 33.9*  PLT 225 170   BMET Recent Labs    01/03/17 1242  01/04/17 0432  NA 134* 136  K 2.4* 2.6*  CL 90* 100*  CO2 27 25  GLUCOSE 103* 105*  BUN 13 13  CREATININE 0.91 0.78  CALCIUM 9.9 8.8*   LFT Recent Labs    01/03/17 1242  PROT 8.0  ALBUMIN 3.9  AST 25  ALT 15  ALKPHOS 81  BILITOT 1.2   PT/INR No results for input(s): LABPROT, INR in the last 72 hours.  STUDIES: Ct Chest W Contrast  Result Date: 01/02/2017 CLINICAL DATA:  Nausea, vomiting, weight loss. EXAM: CT CHEST,  ABDOMEN, AND PELVIS WITH CONTRAST TECHNIQUE: Multidetector CT imaging of the chest, abdomen and pelvis was performed following the standard protocol during bolus administration of intravenous contrast. CONTRAST:  197mL ISOVUE-300 IOPAMIDOL (ISOVUE-300) INJECTION 61% COMPARISON:  None. FINDINGS: CT CHEST FINDINGS Cardiovascular: Atherosclerosis of thoracic aorta is noted without aneurysm or dissection. No pericardial effusion is noted. Normal cardiac size. Coronary artery calcifications are noted. Mediastinum/Nodes: No enlarged mediastinal, hilar, or axillary lymph nodes. Thyroid gland, trachea, and esophagus demonstrate no significant findings. Lungs/Pleura: No pneumothorax or pleural effusion is noted. Right lung is clear. 5 mm nodule is noted in superior segment of left lower lobe best seen on image number 71 of series 4. Musculoskeletal: No chest wall mass or suspicious bone lesions identified. CT ABDOMEN PELVIS FINDINGS Hepatobiliary: Status post cholecystectomy. Mildly nodular hepatic margins are noted concerning for hepatic cirrhosis. No focal abnormality is noted. Pancreas: Unremarkable. No pancreatic ductal dilatation or surrounding inflammatory changes. Spleen: Normal in size without focal abnormality. Adrenals/Urinary Tract: Adrenal glands are unremarkable. Kidneys are normal, without renal calculi, focal lesion, or hydronephrosis. Bladder is unremarkable. Stomach/Bowel: The stomach appears normal. Status post appendectomy. There is no evidence of bowel obstruction  or inflammation. There is noted a large hernia in the right upper quadrant of the abdomen which contains a loop of transverse colon, but does not result in incarceration or obstruction. Vascular/Lymphatic: Aortic atherosclerosis. No enlarged abdominal or pelvic lymph nodes. Reproductive: Uterus and bilateral adnexa are unremarkable. Other: No abnormal fluid collection is noted. Musculoskeletal: No acute or significant osseous findings. IMPRESSION: Atherosclerosis of thoracic and abdominal aorta is noted without aneurysm or dissection. Large hernia is noted in right upper quadrant of abdomen which contains a loop of transverse colon, but does not result in incarceration or obstruction. 5 mm nodule is noted in superior segment of left lower lobe. No follow-up needed if patient is low-risk. Non-contrast chest CT can be considered in 12 months if patient is high-risk. This recommendation follows the consensus statement: Guidelines for Management of Incidental Pulmonary Nodules Detected on CT Images: From the Fleischner Society 2017; Radiology 2017; 284:228-243. Findings are concerning for hepatic cirrhosis. No other abnormality is noted. Electronically Signed   By: Marijo Conception, M.D.   On: 01/02/2017 14:54   Ct Abdomen Pelvis W Contrast  Result Date: 01/02/2017 CLINICAL DATA:  Nausea, vomiting, weight loss. EXAM: CT CHEST, ABDOMEN, AND PELVIS WITH CONTRAST TECHNIQUE: Multidetector CT imaging of the chest, abdomen and pelvis was performed following the standard protocol during bolus administration of intravenous contrast. CONTRAST:  194mL ISOVUE-300 IOPAMIDOL (ISOVUE-300) INJECTION 61% COMPARISON:  None. FINDINGS: CT CHEST FINDINGS Cardiovascular: Atherosclerosis of thoracic aorta is noted without aneurysm or dissection. No pericardial effusion is noted. Normal cardiac size. Coronary artery calcifications are noted. Mediastinum/Nodes: No enlarged mediastinal, hilar, or axillary lymph nodes. Thyroid gland,  trachea, and esophagus demonstrate no significant findings. Lungs/Pleura: No pneumothorax or pleural effusion is noted. Right lung is clear. 5 mm nodule is noted in superior segment of left lower lobe best seen on image number 71 of series 4. Musculoskeletal: No chest wall mass or suspicious bone lesions identified. CT ABDOMEN PELVIS FINDINGS Hepatobiliary: Status post cholecystectomy. Mildly nodular hepatic margins are noted concerning for hepatic cirrhosis. No focal abnormality is noted. Pancreas: Unremarkable. No pancreatic ductal dilatation or surrounding inflammatory changes. Spleen: Normal in size without focal abnormality. Adrenals/Urinary Tract: Adrenal glands are unremarkable. Kidneys are normal, without renal calculi, focal lesion, or hydronephrosis. Bladder is unremarkable. Stomach/Bowel: The stomach appears normal. Status post  appendectomy. There is no evidence of bowel obstruction or inflammation. There is noted a large hernia in the right upper quadrant of the abdomen which contains a loop of transverse colon, but does not result in incarceration or obstruction. Vascular/Lymphatic: Aortic atherosclerosis. No enlarged abdominal or pelvic lymph nodes. Reproductive: Uterus and bilateral adnexa are unremarkable. Other: No abnormal fluid collection is noted. Musculoskeletal: No acute or significant osseous findings. IMPRESSION: Atherosclerosis of thoracic and abdominal aorta is noted without aneurysm or dissection. Large hernia is noted in right upper quadrant of abdomen which contains a loop of transverse colon, but does not result in incarceration or obstruction. 5 mm nodule is noted in superior segment of left lower lobe. No follow-up needed if patient is low-risk. Non-contrast chest CT can be considered in 12 months if patient is high-risk. This recommendation follows the consensus statement: Guidelines for Management of Incidental Pulmonary Nodules Detected on CT Images: From the Fleischner Society  2017; Radiology 2017; 284:228-243. Findings are concerning for hepatic cirrhosis. No other abnormality is noted. Electronically Signed   By: Marijo Conception, M.D.   On: 01/02/2017 14:54      Impression / Plan:   Anne [name] is a 66 y.o. y/o female with weight loss, dysphagia, recurrent vomiting ongoing for the past two months. Large hiatal hernia on imaging . Differentials include a peptic stricture from GERD vs a neoplasm .     Plan 1.  Replace potassium  2.  Since she has borderline microcytosis suggest checking B12 folate iron studies ferritin. 3.  Antiemetics as needed for nausea along with a PPI 4. Ideally she should have had an EGD tomorrow but the endoscopy unit is shut down for renovation  except for life threatening conditions . IF has no vomiting we can plan for an outpatient EGD Monday or Tuesday. If continues to vomit then will need to be done as inpatient on Monday .   Thank you for involving me in the care of this patient.      LOS: 1 day   Jonathon Bellows, MD  01/04/2017, 8:16 AM

## 2017-01-04 NOTE — Progress Notes (Signed)
Notified Dr. Marcille Blanco of critical lab value potassium of 2.6. Previous potassium 2.4. Awaiting new orders. Will continue to monitor patient.

## 2017-01-04 NOTE — Care Management Important Message (Signed)
Important Message  Patient Details  Name: Anne [name] MRN: [id number] Date of Birth: 06-30-1950   Medicare Important Message Given:  Yes    Beverly Sessions, RN 01/04/2017, 10:12 AM

## 2017-01-04 NOTE — Progress Notes (Signed)
Initial Nutrition Assessment  DOCUMENTATION CODES:   Severe malnutrition in context of acute illness/injury  INTERVENTION:   Pt at high refeeding risk; recommend monitor K, P, and Mg  RD will order Premier Protein (chocolate) when diet advanced  MVI daily  Recommend Omega 3 daily- pt may not be able to swallow this pill  NUTRITION DIAGNOSIS:   Severe Malnutrition related to acute illness, poor appetite(dysphagia ) as evidenced by 18 percent weight loss in < 4 months, moderate fat depletion, moderate muscle depletion.  GOAL:   Patient will meet greater than or equal to 90% of their needs  MONITOR:   PO intake, Supplement acceptance, Labs, Weight trends, Skin, I & O's  REASON FOR ASSESSMENT:   Malnutrition Screening Tool    ASSESSMENT:   66 y.o. female with dysphasia concerning for esophageal diverticulum vs achalasia and an asymptomatic large RUQ ventral hernia containing transverse colon without evidence of incarceration or obstruction, complicated by pertinent comorbidities including obesity, HTN, HLD, osteoarthritis, history of tobacco abuse (smoking), and major depression disorder.   Met with pt in room today. Pt reports poor appetite and oral intake for the past 4 months but reports it has been severely poor for the past 2 months. Pt also reports diarrhea today and intermittent nausea and vomiting for 1 week. Per chart, pt has lost 38lbs(18%) in < 4 months; this is severe. Pt reports dysphagia; describes food getting stuck in her throat (around her chest area). Pt has been able to drink mainly liquids for the past 6 weeks. Pt does not like Ensure; pt is willing to try Premier Protein. Pt noted to have dry, scaly skin; pt reports that her skin is always dry but it is much worse now. RD suspects this is related to dehydration but pt may benefit from Omega 3 daily as she has not likely been meeting her fatty acid requirements. Pt is currently drinking sips of her clear liquid  diet. GI consult pending; pt to possibly have EGD today. RD will order supplements when diet advanced. Pt at high refeeding risk; recommend monitor K, P, and Mg for at least 3 days.      Medications reviewed and include: heparin, NaCl w/ KCl @75ml/hr  Labs reviewed: K 2.6(L), Cl 100(L), Ca 8.8(L), Mg 1.7 wnl  Nutrition-Focused physical exam completed. Findings are moderate fat depletions in buccal and orbital regions, moderate muscle depletions in clavicles, temporal regions, BLE, and no edema. Pt noted to have dry, scaly skin. Pt reports dry mouth.   Diet Order:  Diet NPO time specified  EDUCATION NEEDS:   Education needs have been addressed  Skin:  Reviewed RN Assessment  Last BM:  11/30- type 7  Height:   Ht Readings from Last 1 Encounters:  01/03/17 5' 6" (1.676 m)    Weight:   Wt Readings from Last 1 Encounters:  01/03/17 175 lb (79.4 kg)    Ideal Body Weight:  59 kg  BMI:  Body mass index is 28.25 kg/m.  Estimated Nutritional Needs:   Kcal:  1600-1900kcal/day   Protein:  80-95g/day   Fluid:  >1.6L/day     MS, RD, LDN Pager #- [phone] After Hours Pager: [phone]  

## 2017-01-05 DIAGNOSIS — R197 Diarrhea, unspecified: Secondary | ICD-10-CM

## 2017-01-05 DIAGNOSIS — R1310 Dysphagia, unspecified: Secondary | ICD-10-CM

## 2017-01-05 LAB — BASIC METABOLIC PANEL
Anion gap: 9 (ref 5–15)
BUN: 11 mg/dL (ref 6–20)
CALCIUM: 8.6 mg/dL — AB (ref 8.9–10.3)
CHLORIDE: 104 mmol/L (ref 101–111)
CO2: 24 mmol/L (ref 22–32)
CREATININE: 0.75 mg/dL (ref 0.44–1.00)
GFR calc Af Amer: >60 mL/min (ref >60)
GFR calc non Af Amer: >60 mL/min (ref >60)
GLUCOSE: 94 mg/dL (ref 65–99)
Potassium: 3.1 mmol/L — ABNORMAL LOW (ref 3.5–5.1)
Sodium: 137 mmol/L (ref 135–145)

## 2017-01-05 LAB — MAGNESIUM: MAGNESIUM: 2 mg/dL (ref 1.7–2.4)

## 2017-01-05 LAB — PHOSPHORUS: Phosphorus: 2.3 mg/dL — ABNORMAL LOW (ref 2.5–4.6)

## 2017-01-05 MED ORDER — POTASSIUM CHLORIDE CRYS ER 20 MEQ PO TBCR
40.0000 meq | EXTENDED_RELEASE_TABLET | Freq: Once | ORAL | Status: AC
  Administered 2017-01-05: 40 meq via ORAL
  Filled 2017-01-05: qty 2

## 2017-01-05 NOTE — Progress Notes (Signed)
Jonathon Bellows , MD [address], [place], [place], [place], [address] [address], [place], [place], [place], [address] Phone: [phone]  Fax: [phone]   Anne [name] is being followed for nausea,vomiting and weight loss Day 1 of follow up   Subjective: Was doing better yesterday , when diet was advanced started throwing up yet again. Having diarrhea.  Objective: Vital signs in last 24 hours: Vitals:   01/04/17 0942 01/04/17 1207 01/04/17 1949 01/05/17 0453  BP: 114/68 120/67 123/66 129/71  Pulse:  78 71 81  Resp:   20 20  Temp:  (!) 97.4 F (36.3 C) 98.2 F (36.8 C) 97.8 F (36.6 C)  TempSrc:  Oral Oral Oral  SpO2:  94% 98% 95%  Weight:      Height:       Weight change:   Intake/Output Summary (Last 24 hours) at 01/05/2017 0945 Last data filed at 01/05/2017 0558 Gross per 24 hour  Intake 1837.25 ml  Output 350 ml  Net 1487.25 ml     Exam: Heart:: Regular rate and rhythm, S1S2 present or without murmur or extra heart sounds Lungs: normal, clear to auscultation and clear to auscultation and percussion Abdomen: soft, nontender, normal bowel sounds   Lab Results: CMP Latest Ref Rng & Units 01/05/2017 01/04/2017 01/03/2017  Glucose 65 - 99 mg/dL 94 105(H) 103(H)  BUN 6 - 20 mg/dL 11 13 13   Creatinine 0.44 - 1.00 mg/dL 0.75 0.78 0.91  Sodium 135 - 145 mmol/L 137 136 134(L)  Potassium 3.5 - 5.1 mmol/L 3.1(L) 2.6(LL) 2.4(LL)  Chloride 101 - 111 mmol/L 104 100(L) 90(L)  CO2 22 - 32 mmol/L 24 25 27   Calcium 8.9 - 10.3 mg/dL 8.6(L) 8.8(L) 9.9  Total Protein 6.5 - 8.1 g/dL - - 8.0  Total Bilirubin 0.3 - 1.2 mg/dL - - 1.2  Alkaline Phos 38 - 126 U/L - - 81  AST 15 - 41 U/L - - 25  ALT 14 - 54 U/L - - 15    Micro Results: No results found for this or any previous visit (from the past 240 hour(s)). Studies/Results: No results found. Medications: I have reviewed the patient's current medications. Scheduled Meds: . amLODipine  10 mg Oral Daily  .  heparin  5,000 Units Subcutaneous Q8H  . oxybutynin  5 mg Oral QHS  . PARoxetine  20 mg Oral Daily  . potassium chloride  40 mEq Oral Once  . rosuvastatin  20 mg Oral Daily   Continuous Infusions: . 0.9 % NaCl with KCl 20 mEq / L 75 mL/hr at 01/04/17 2117   PRN Meds:.docusate sodium, ondansetron (ZOFRAN) IV, prochlorperazine   Assessment: Principal Problem:   Hypokalemia  Anne [name] is a 66 y.o. y/o female with weight loss, dysphagia, recurrent vomiting ongoing for the past two months. Large hiatal hernia on imaging . Differentials include a peptic stricture from GERD vs a neoplasm .     Plan 1.  Low folate- suggest replacement  3.  Antiemetics as needed for nausea along with a PPI 4. EGD on Monday - would not discharge her tomorrow- high chance of readmission  5. If continues to have diarrhea check for C diff and stool PCR.   I have discussed alternative options, risks & benefits,  which include, but are not limited to, bleeding, infection, perforation,respiratory complication & drug reaction.  The patient agrees with this plan & written consent will be obtained.  LOS: 2 days   Jonathon Bellows, MD 01/05/2017, 9:45 AM

## 2017-01-05 NOTE — Progress Notes (Signed)
Patient had been cleared for discharge but began vomiting. Per Dr Anselm Jungling, discharge order discontinued. Patient will remain here until EGD is performed on Monday by Dr Vicente Males.

## 2017-01-05 NOTE — Progress Notes (Signed)
Patient ID: Anne [name], female   DOB: [birth year], 66 y.o.   MRN: [id number]  [place] PROGRESS NOTE  Anne [name] MLY:[id number] DOB: 01-May-1950 DOA: 01/03/2017 PCP: Leone Haven, MD  HPI/Subjective: Patient has had nausea vomiting for over a month. Felt little better here, but again now have vomiting,s o initial plan for discharge is cancled and cont monitor on IV fluids. Objective: Vitals:   01/04/17 1949 01/05/17 0453  BP: 123/66 129/71  Pulse: 71 81  Resp: 20 20  Temp: 98.2 F (36.8 C) 97.8 F (36.6 C)  SpO2: 98% 95%    Filed Weights   01/03/17 1239  Weight: 79.4 kg (175 lb)    ROS: Review of Systems  Constitutional: Negative for chills and fever.  Eyes: Negative for blurred vision.  Respiratory: Negative for cough and shortness of breath.   Cardiovascular: Negative for chest pain.  Gastrointestinal: Positive for diarrhea, nausea and vomiting. Negative for abdominal pain and constipation.  Genitourinary: Negative for dysuria.  Musculoskeletal: Negative for joint pain.  Neurological: Negative for dizziness and headaches.   Exam: Physical Exam  Constitutional: She is oriented to person, place, and time.  HENT:  Nose: No mucosal edema.  Mouth/Throat: No oropharyngeal exudate or posterior oropharyngeal edema.  Eyes: Conjunctivae, EOM and lids are normal. Pupils are equal, round, and reactive to light.  Neck: No JVD present. Carotid bruit is not present. No edema present. No thyroid mass and no thyromegaly present.  Cardiovascular: S1 normal and S2 normal. Exam reveals no gallop.  No murmur heard. Pulses:      Dorsalis pedis pulses are 2+ on the right side, and 2+ on the left side.  Respiratory: No respiratory distress. She has no wheezes. She has no rhonchi. She has no rales.  GI: Soft. Bowel sounds are normal. There is no tenderness. A hernia is present. Hernia confirmed positive in the ventral area.  Musculoskeletal:       Right ankle: She  exhibits no swelling.       Left ankle: She exhibits no swelling.  Lymphadenopathy:    She has no cervical adenopathy.  Neurological: She is alert and oriented to person, place, and time. No cranial nerve deficit.  Skin: Skin is warm. No rash noted. Nails show no clubbing.  Psychiatric: She has a normal mood and affect.      Data Reviewed: Basic Metabolic Panel: Recent Labs  Lab 01/03/17 1242 01/04/17 0432 01/05/17 0505  NA 134* 136 137  K 2.4* 2.6* 3.1*  CL 90* 100* 104  CO2 27 25 24   GLUCOSE 103* 105* 94  BUN 13 13 11   CREATININE 0.91 0.78 0.75  CALCIUM 9.9 8.8* 8.6*  MG 1.7  --  2.0  PHOS  --   --  2.3*   Liver Function Tests: Recent Labs  Lab 01/03/17 1242  AST 25  ALT 15  ALKPHOS 81  BILITOT 1.2  PROT 8.0  ALBUMIN 3.9   Recent Labs  Lab 01/03/17 1242  LIPASE 44   CBC: Recent Labs  Lab 01/03/17 1242 01/04/17 0432  WBC 8.5 6.5  HGB 12.7 11.2*  HCT 37.6 33.9*  MCV 80.6 81.6  PLT 225 170    Scheduled Meds: . amLODipine  10 mg Oral Daily  . heparin  5,000 Units Subcutaneous Q8H  . oxybutynin  5 mg Oral QHS  . PARoxetine  20 mg Oral Daily  . rosuvastatin  20 mg Oral Daily   Continuous Infusions: . 0.9 % NaCl  with KCl 20 mEq / L 75 mL/hr at 01/04/17 2117    Assessment/Plan:  1. Hypokalemia.  Patient still with low potassium today.  Patient takes Maxide at home which is the likely culprit along with vomiting over the past month.  Stop Maxide.  IV fluids with potassium. Improved some. 2. Hypomagnesemia replace magnesium also. 3. Nausea vomiting.  Symptomatic management with nausea medications.  Stop medications that can irritate the stomach including Naprosyn and meloxicam.  Gastroenterology commented on endoscopy as outpatient, if she does not improve, then as inpatient on Monday. 4. 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  Check hepatitis profiles. 5. Hyperlipidemia unspecified on Crestor 6. Essential hypertension on Norvasc 7. Depression on  Paxil 8. Microcytic anemia GI ordered iron studies. 9. Right upper quadrant ventral hernia.  Seen by surgery. No intervention this time.  Code Status:     Code Status Orders  (From admission, onward)        Start     Ordered   01/03/17 1654  Full code  Continuous     01/03/17 1653    Code Status History    Date Active Date Inactive Code Status Order ID Comments User Context   11/25/2015 21:42 11/28/2015 09:32 Full Code [id number]  Lance Coon, MD Inpatient     Family Communication: daughter at the bedside Disposition Plan: Hopefully be able to go home soon  Consultants:  General surgery  Gastroenterology  Time spent: 28 minutes  [place]

## 2017-01-06 LAB — GASTROINTESTINAL PANEL BY PCR, STOOL (REPLACES STOOL CULTURE)
Adenovirus F40/41: NOT DETECTED
Astrovirus: NOT DETECTED
CRYPTOSPORIDIUM: NOT DETECTED
CYCLOSPORA CAYETANENSIS: NOT DETECTED
Campylobacter species: NOT DETECTED
ENTAMOEBA HISTOLYTICA: NOT DETECTED
Enteroaggregative E coli (EAEC): NOT DETECTED
Enteropathogenic E coli (EPEC): NOT DETECTED
Enterotoxigenic E coli (ETEC): NOT DETECTED
Giardia lamblia: NOT DETECTED
Norovirus GI/GII: NOT DETECTED
Plesimonas shigelloides: NOT DETECTED
Rotavirus A: NOT DETECTED
SALMONELLA SPECIES: NOT DETECTED
SAPOVIRUS (I, II, IV, AND V): NOT DETECTED
SHIGA LIKE TOXIN PRODUCING E COLI (STEC): NOT DETECTED
SHIGELLA/ENTEROINVASIVE E COLI (EIEC): NOT DETECTED
VIBRIO CHOLERAE: NOT DETECTED
VIBRIO SPECIES: NOT DETECTED
YERSINIA ENTEROCOLITICA: NOT DETECTED

## 2017-01-06 LAB — C DIFFICILE QUICK SCREEN W PCR REFLEX
C Diff antigen: NEGATIVE
C Diff interpretation: NOT DETECTED
C Diff toxin: NEGATIVE

## 2017-01-06 LAB — PHOSPHORUS: Phosphorus: 2.2 mg/dL — ABNORMAL LOW (ref 2.5–4.6)

## 2017-01-06 LAB — MAGNESIUM: Magnesium: 1.8 mg/dL (ref 1.7–2.4)

## 2017-01-06 MED ORDER — FOLIC ACID 1 MG PO TABS
1.0000 mg | ORAL_TABLET | Freq: Every day | ORAL | Status: DC
  Administered 2017-01-06 – 2017-01-08 (×3): 1 mg via ORAL
  Filled 2017-01-06 (×3): qty 1

## 2017-01-06 NOTE — Care Management Note (Signed)
Case Management Note  Patient Details  Name: Anne [name] MRN: [id number] Date of Birth: 05-09-1950  Subjective/Objective:   Anne [name] was admitted with a large abdominal hernia, low potassium and vomiting. She resides at home with her husband. PCP=Dr Sonneberg. Pharmacy=[facility] on [address]. She reports a cane and a RW at home. No home oxygen, no home health providers. Anne [name] drives herself to appointments. Anne [name] was provided with a list of local home health providers, and this writer explained that IF home health services were ordered that she would be asked to choose a provider. She verbalized understanding. Case manager will follow for discharge planning.                 Action/Plan:   Expected Discharge Date:  01/05/17               Expected Discharge Plan:     In-House Referral:     Discharge planning Services     Post Acute Care Choice:    Choice offered to:     DME Arranged:    DME Agency:     HH Arranged:    HH Agency:     Status of Service:     If discussed at [employer], dates discussed:    Additional Comments:  Kinleigh Nault A, RN 01/06/2017, 3:32 PM

## 2017-01-06 NOTE — Progress Notes (Signed)
Patient ID: BRYAR RENNIE, female   DOB: 04-20-1950, 66 y.o.   MRN: [id number]  [place] PROGRESS NOTE  JAQUITA BESSIRE ATF:[id number] DOB: [birth year] DOA: 01/03/2017 PCP: Leone Haven, MD  HPI/Subjective: Patient has had nausea vomiting for over a month. Felt little better here, but again have vomiting and diarrhea, so GI suggest to keep inpatient and plan for EGD Monday.   Objective: Vitals:   01/06/17 0514 01/06/17 1223  BP: 126/68 114/69  Pulse: 78 79  Resp: 20 17  Temp: 97.8 F (36.6 C) 97.8 F (36.6 C)  SpO2: 96% 97%    Filed Weights   01/03/17 1239  Weight: 79.4 kg (175 lb)    ROS: Review of Systems  Constitutional: Negative for chills and fever.  Eyes: Negative for blurred vision.  Respiratory: Negative for cough and shortness of breath.   Cardiovascular: Negative for chest pain.  Gastrointestinal: Positive for diarrhea, nausea and vomiting. Negative for abdominal pain and constipation.  Genitourinary: Negative for dysuria.  Musculoskeletal: Negative for joint pain.  Neurological: Negative for dizziness and headaches.   Exam: Physical Exam  Constitutional: She is oriented to person, place, and time.  HENT:  Nose: No mucosal edema.  Mouth/Throat: No oropharyngeal exudate or posterior oropharyngeal edema.  Eyes: Conjunctivae, EOM and lids are normal. Pupils are equal, round, and reactive to light.  Neck: No JVD present. Carotid bruit is not present. No edema present. No thyroid mass and no thyromegaly present.  Cardiovascular: S1 normal and S2 normal. Exam reveals no gallop.  No murmur heard. Pulses:      Dorsalis pedis pulses are 2+ on the right side, and 2+ on the left side.  Respiratory: No respiratory distress. She has no wheezes. She has no rhonchi. She has no rales.  GI: Soft. Bowel sounds are normal. There is no tenderness. A hernia is present. Hernia confirmed positive in the ventral area.  Musculoskeletal:       Right ankle: She exhibits  no swelling.       Left ankle: She exhibits no swelling.  Lymphadenopathy:    She has no cervical adenopathy.  Neurological: She is alert and oriented to person, place, and time. No cranial nerve deficit.  Skin: Skin is warm. No rash noted. Nails show no clubbing.  Psychiatric: She has a normal mood and affect.      Data Reviewed: Basic Metabolic Panel: Recent Labs  Lab 01/03/17 1242 01/04/17 0432 01/05/17 0505 01/06/17 0430  NA 134* 136 137  --   K 2.4* 2.6* 3.1*  --   CL 90* 100* 104  --   CO2 27 25 24   --   GLUCOSE 103* 105* 94  --   BUN 13 13 11   --   CREATININE 0.91 0.78 0.75  --   CALCIUM 9.9 8.8* 8.6*  --   MG 1.7  --  2.0 1.8  PHOS  --   --  2.3* 2.2*   Liver Function Tests: Recent Labs  Lab 01/03/17 1242  AST 25  ALT 15  ALKPHOS 81  BILITOT 1.2  PROT 8.0  ALBUMIN 3.9   Recent Labs  Lab 01/03/17 1242  LIPASE 44   CBC: Recent Labs  Lab 01/03/17 1242 01/04/17 0432  WBC 8.5 6.5  HGB 12.7 11.2*  HCT 37.6 33.9*  MCV 80.6 81.6  PLT 225 170    Scheduled Meds: . amLODipine  10 mg Oral Daily  . folic acid  1 mg Oral Daily  .  heparin  5,000 Units Subcutaneous Q8H  . oxybutynin  5 mg Oral QHS  . PARoxetine  20 mg Oral Daily  . rosuvastatin  20 mg Oral Daily   Continuous Infusions:   Assessment/Plan:  1. Hypokalemia.  Patient still with low potassium today.  Patient takes Maxide at home which is the likely culprit along with vomiting over the past month.  Stop Maxide.  IV fluids with potassium. Improved some. 2. Hypomagnesemia replace magnesium also. 3. Nausea vomiting.  Symptomatic management with nausea medications.  Stop medications that can irritate the stomach including Naprosyn and meloxicam.  Plan for EGD on Monday. As have diarrhea - check for stool- c diff and GI panel. 4. 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  Check hepatitis profiles. 5. Hyperlipidemia unspecified on Crestor 6. Essential hypertension on Norvasc 7. Depression on  Paxil 8. Microcytic anemia GI ordered iron studies. 9. Right upper quadrant ventral hernia.  Seen by surgery. No intervention this time.  Code Status:     Code Status Orders  (From admission, onward)        Start     Ordered   01/03/17 1654  Full code  Continuous     01/03/17 1653    Code Status History    Date Active Date Inactive Code Status Order ID Comments User Context   11/25/2015 21:42 11/28/2015 09:32 Full Code [id number]  Lance Coon, MD Inpatient     Family Communication: daughter at the bedside Disposition Plan: Hopefully be able to go home soon  Consultants:  General surgery  Gastroenterology  Time spent: 28 minutes  [place]

## 2017-01-07 ENCOUNTER — Inpatient Hospital Stay: Payer: PPO | Admitting: Certified Registered Nurse Anesthetist

## 2017-01-07 ENCOUNTER — Encounter
Admission: EM | Disposition: A | Discharge status: Home / Self Care | Payer: Self-pay | Source: Home / Self Care | Attending: Internal Medicine

## 2017-01-07 ENCOUNTER — Encounter: Payer: Self-pay | Admitting: Certified Registered Nurse Anesthetist

## 2017-01-07 DIAGNOSIS — K2970 Gastritis, unspecified, without bleeding: Secondary | ICD-10-CM

## 2017-01-07 DIAGNOSIS — K449 Diaphragmatic hernia without obstruction or gangrene: Secondary | ICD-10-CM

## 2017-01-07 DIAGNOSIS — K2990 Gastroduodenitis, unspecified, without bleeding: Secondary | ICD-10-CM

## 2017-01-07 HISTORY — PX: ESOPHAGOGASTRODUODENOSCOPY (EGD) WITH PROPOFOL: SHX5813

## 2017-01-07 LAB — BASIC METABOLIC PANEL
ANION GAP: 8 (ref 5–15)
BUN: 9 mg/dL (ref 6–20)
CO2: 22 mmol/L (ref 22–32)
CREATININE: 0.62 mg/dL (ref 0.44–1.00)
Calcium: 8.6 mg/dL — ABNORMAL LOW (ref 8.9–10.3)
Chloride: 106 mmol/L (ref 101–111)
GFR calc non Af Amer: >60 mL/min (ref >60)
GLUCOSE: 83 mg/dL (ref 65–99)
Potassium: 3.2 mmol/L — ABNORMAL LOW (ref 3.5–5.1)
Sodium: 136 mmol/L (ref 135–145)

## 2017-01-07 LAB — HEPATITIS B SURFACE ANTIBODY, QUANTITATIVE: HEPATITIS B-POST: 40.8 m[IU]/mL

## 2017-01-07 LAB — PHOSPHORUS: Phosphorus: 2.2 mg/dL — ABNORMAL LOW (ref 2.5–4.6)

## 2017-01-07 LAB — HEPATITIS B CORE ANTIBODY, TOTAL: HEP B C TOTAL AB: NEGATIVE

## 2017-01-07 LAB — HEPATITIS C ANTIBODY

## 2017-01-07 LAB — MAGNESIUM: MAGNESIUM: 1.6 mg/dL — AB (ref 1.7–2.4)

## 2017-01-07 SURGERY — ESOPHAGOGASTRODUODENOSCOPY (EGD) WITH PROPOFOL
Anesthesia: General

## 2017-01-07 MED ORDER — SODIUM CHLORIDE 0.9 % IV SOLN
INTRAVENOUS | Status: DC | PRN
  Administered 2017-01-07: 11:00:00 via INTRAVENOUS

## 2017-01-07 MED ORDER — POTASSIUM CHLORIDE 10 MEQ/100ML IV SOLN
10.0000 meq | INTRAVENOUS | Status: AC
  Administered 2017-01-07 (×2): 10 meq via INTRAVENOUS
  Filled 2017-01-07 (×2): qty 100

## 2017-01-07 MED ORDER — PROPOFOL 500 MG/50ML IV EMUL
INTRAVENOUS | Status: AC
  Filled 2017-01-07: qty 50

## 2017-01-07 MED ORDER — POTASSIUM CHLORIDE CRYS ER 20 MEQ PO TBCR
40.0000 meq | EXTENDED_RELEASE_TABLET | Freq: Two times a day (BID) | ORAL | Status: DC
  Filled 2017-01-07: qty 2

## 2017-01-07 MED ORDER — LIDOCAINE HCL (PF) 2 % IJ SOLN
INTRAMUSCULAR | Status: AC
  Filled 2017-01-07: qty 10

## 2017-01-07 MED ORDER — LIDOCAINE HCL (CARDIAC) 20 MG/ML IV SOLN
INTRAVENOUS | Status: DC | PRN
  Administered 2017-01-07: 50 mg via INTRAVENOUS

## 2017-01-07 MED ORDER — SODIUM CHLORIDE 0.9 % IV SOLN: INTRAVENOUS | Status: DC

## 2017-01-07 MED ORDER — ADULT MULTIVITAMIN LIQUID CH
15.0000 mL | Freq: Every day | ORAL | Status: DC
  Administered 2017-01-07 – 2017-01-08 (×2): 15 mL via ORAL
  Filled 2017-01-07 (×3): qty 15

## 2017-01-07 MED ORDER — PREMIER PROTEIN SHAKE: 11.0000 [oz_av] | Freq: Two times a day (BID) | ORAL | Status: DC

## 2017-01-07 MED ORDER — SODIUM CHLORIDE 0.9 % IV SOLN: Freq: Once | INTRAVENOUS | Status: DC

## 2017-01-07 MED ORDER — PROPOFOL 500 MG/50ML IV EMUL
INTRAVENOUS | Status: DC | PRN
  Administered 2017-01-07: 140 ug/kg/min via INTRAVENOUS

## 2017-01-07 MED ORDER — PROPOFOL 10 MG/ML IV BOLUS
INTRAVENOUS | Status: DC | PRN
  Administered 2017-01-07 (×3): 30 mg via INTRAVENOUS
  Administered 2017-01-07: 10 mg via INTRAVENOUS

## 2017-01-07 MED ORDER — PANTOPRAZOLE SODIUM 40 MG PO TBEC
40.0000 mg | DELAYED_RELEASE_TABLET | Freq: Two times a day (BID) | ORAL | Status: DC
  Administered 2017-01-07 – 2017-01-08 (×2): 40 mg via ORAL
  Filled 2017-01-07 (×2): qty 1

## 2017-01-07 MED ORDER — MAGNESIUM SULFATE 2 GM/50ML IV SOLN
2.0000 g | Freq: Once | INTRAVENOUS | Status: AC
  Administered 2017-01-07: 2 g via INTRAVENOUS
  Filled 2017-01-07: qty 50

## 2017-01-07 NOTE — H&P (Signed)
Jonathon Bellows, MD [address], [place], [place], [place], [address] [address], [place], [place], [place], [address] Phone: [phone]  Fax: [phone]  Primary Care Physician:  Leone Haven, MD   Pre-Procedure History & Physical: HPI:  Anne [name] is a 66 y.o. female is here for an endoscopy    Past Medical History:  Diagnosis Date  . Arthritis   . Depression   . Hyperlipidemia   . Hypertension     Past Surgical History:  Procedure Laterality Date  . APPENDECTOMY  1971  . CHOLECYSTECTOMY  1971  . COLONOSCOPY WITH PROPOFOL N/A 11/15/2016   Procedure: COLONOSCOPY WITH PROPOFOL;  Surgeon: Jonathon Bellows, MD;  Location: [facility] ENDOSCOPY;  Service: Gastroenterology;  Laterality: N/A;  . TONSILECTOMY, ADENOIDECTOMY, BILATERAL MYRINGOTOMY [place]    Prior to Admission medications   Medication Sig Start Date End Date Taking? Authorizing Provider  amLODipine (NORVASC) 10 MG tablet Take 1 tablet (10 mg total) by mouth daily. 09/12/16  Yes Leone Haven, MD  DHA-Vitamin C-Lutein (EYE HEALTH FORMULA PO) Take by mouth.   Yes [provider]  Fish Oil-Cholecalciferol (FISH OIL + D3) 1000-1000 MG-UNIT CAPS Take by mouth.   Yes [provider]  meloxicam (MOBIC) 15 MG tablet Take 1 tablet (15 mg total) by mouth daily. 04/21/15  Yes Doss, Velora Heckler, RN  naproxen (NAPROSYN) 500 MG tablet Take 1 tablet (500 mg total) by mouth 2 (two) times daily with a meal. 10/17/16  Yes Burnard Hawthorne, FNP  oxybutynin (DITROPAN-XL) 5 MG 24 hr tablet TAKE 1 TABLET BY MOUTH AT BEDTIME 11/02/16  Yes Leone Haven, MD  PARoxetine (PAXIL) 20 MG tablet TAKE ONE TABLET BY MOUTH ONCE DAILY 02/08/16  Yes Leone Haven, MD  potassium chloride (KLOR-CON) 20 MEQ packet Take 20 mEq 2 (two) times daily by mouth. 12/20/16  Yes Baity, Coralie Keens, NP  promethazine (PHENERGAN) 12.5 MG tablet Take 1 tablet (12.5 mg total) by mouth every 8 (eight) hours as needed for nausea or  vomiting. 03/09/16  Yes Cook, Jayce G, DO  rosuvastatin (CRESTOR) 20 MG tablet Take 1 tablet (20 mg total) by mouth daily. 09/14/16  Yes Leone Haven, MD  [facility] Wort 150 MG CAPS Take 1 capsule by mouth daily.   Yes [provider]  triamterene-hydrochlorothiazide (MAXZIDE-25) 37.5-25 MG tablet  12/02/16  Yes [provider]    Allergies as of 01/03/2017  . (No Known Allergies)    Family History  Problem Relation Age of Onset  . Cancer Mother        ovarian  . Hypertension Mother   . Ovarian cancer Mother 54  . Heart disease Father   . Stroke Father   . Ovarian cancer Sister        ? dx cancer had hyst.  . Breast cancer Neg Hx     Social History   Socioeconomic History  . Marital status: Married    Spouse name: Not on file  . Number of children: Not on file  . Years of education: Not on file  . Highest education level: Not on file  Social Needs  . Financial resource strain: Not on file  . Food insecurity - worry: Not on file  . Food insecurity - inability: Not on file  . Transportation needs - medical: Not on file  . Transportation needs - non-medical: Not on file  Occupational History  . Not on file  Tobacco Use  . Smoking status: Former Smoker    Last attempt to quit: 02/07/2012    Years since quitting: 4.9  . Smokeless tobacco: Never Used  Substance and Sexual Activity  . Alcohol use: No    Alcohol/week: 0.0 oz  . Drug use: No  . Sexual activity: Yes  Other Topics Concern  . Not on file  Social History Narrative   Married   Retired   Clinical cytogeneticist level of education    No children    1 cup of coffee    Review of Systems: See HPI, otherwise negative ROS  Physical Exam: BP 128/64   Pulse 77   Temp 97.8 F (36.6 C) (Tympanic)   Resp 20   Ht 5\' 6"  (1.676 m)   Wt 175 lb (79.4 kg)   SpO2 98%   BMI 28.25 kg/m  General:   Alert,  pleasant and cooperative in NAD Head:  Normocephalic and atraumatic. Neck:  Supple; no  masses or thyromegaly. Lungs:  Clear throughout to auscultation, normal respiratory effort.    Heart:  +S1, +S2, Regular rate and rhythm, No edema. Abdomen:  Soft, nontender and nondistended. Normal bowel sounds, without guarding, and without rebound.   Neurologic:  Alert and  oriented x4;  grossly normal neurologically.  Impression/Plan: Anne [name] is here for an endoscopy+/- dilation   to be performed for  evaluation of nausea,vomiting,dysphagia    Risks, benefits, limitations, and alternatives regarding endoscopy have been reviewed with the patient.  Questions have been answered.  All parties agreeable.   Jonathon Bellows, MD  01/07/2017, 10:40 AM

## 2017-01-07 NOTE — Progress Notes (Signed)
See endoscopy note for recommendations.   I will sign off.  Please call me if any further GI concerns or questions.  We would like to thank you for the opportunity to participate in the care of [place].    Dr Jonathon Bellows MD,MRCP [facility]) Gastroenterology/Hepatology Pager: [phone]

## 2017-01-07 NOTE — Progress Notes (Signed)
Nutrition Follow Up Note   DOCUMENTATION CODES:   Severe malnutrition in context of acute illness/injury  INTERVENTION:   Pt at high refeeding risk; continue to monitor K, P, and Mg  Premier Protein BID, each supplement provides 160 kcal and 30 grams of protein.   MVI daily  Recommend Omega 3 daily  NUTRITION DIAGNOSIS:   Severe Malnutrition related to acute illness, poor appetite(dysphagia ) as evidenced by 18 percent weight loss in < 4 months, moderate fat depletion, moderate muscle depletion.  GOAL:   Patient will meet greater than or equal to 90% of their needs  MONITOR:   PO intake, Supplement acceptance, Labs, Weight trends, Skin, I & O's    ASSESSMENT:   66 y.o. female with dysphasia concerning for esophageal diverticulum vs achalasia and an asymptomatic large RUQ ventral hernia containing transverse colon without evidence of incarceration or obstruction, complicated by pertinent comorbidities including obesity, HTN, HLD, osteoarthritis, history of tobacco abuse (smoking), and major depression disorder.   Pt doing better; eating 25-100% of meals. RD will order Premier Protein and MVI. Pt s/p EGD with esophageal dilation today. 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 Pt continues to refeed; recommend monitor K, P, and Mg until labs stabilized. Per chart, pt weight stable since admit.   Medications reviewed and include: folic acid, heparin, protonix, zofran  Labs reviewed: K 3.2(L), Ca 8.6(L), P 2.2(L), Mg 1.6(L) Folate 3.5(L)- 11/30 B12- 257- 11/30  Diet Order:  Diet - low sodium heart healthy Diet Heart Room service appropriate? Yes; Fluid consistency: Thin  EDUCATION NEEDS:   Education needs have been addressed  Skin:  Reviewed RN Assessment  Last BM:  12/2- type 7  Height:   Ht Readings from Last 1 Encounters:  01/07/17 5\' 6"  (1.676 m)    Weight:   Wt Readings from Last 1 Encounters:  01/07/17 175 lb (79.4 kg)     Ideal Body Weight:  59 kg  BMI:  Body mass index is 28.25 kg/m.  Estimated Nutritional Needs:   Kcal:  1600-1900kcal/day   Protein:  80-95g/day   Fluid:  >1.6L/day   Koleen Distance MS, RD, LDN Pager #[phone] After Hours Pager: [phone]

## 2017-01-07 NOTE — Progress Notes (Signed)
Patient ID: Anne [name], female   DOB: 1950/11/17, 66 y.o.   MRN: [id number]  [place] PROGRESS NOTE  Anne [name] JIR:[id number] DOB: [birth year] DOA: 01/03/2017 PCP: Leone Haven, MD  HPI/Subjective: Patient has had nausea vomiting for over a month. Felt little better here, but again have vomiting and diarrhea, so GI suggest to keep inpatient and plan for EGD - which is done- showed gastritis. Pt felt nauseated after EGD still.   Objective: Vitals:   01/07/17 1135 01/07/17 1241  BP: (!) 110/59 131/74  Pulse: 73 74  Resp: 14 16  Temp:  97.9 F (36.6 C)  SpO2: 100% 100%    Filed Weights   01/03/17 1239 01/07/17 1036  Weight: 79.4 kg (175 lb) 79.4 kg (175 lb)    ROS: Review of Systems  Constitutional: Negative for chills and fever.  Eyes: Negative for blurred vision.  Respiratory: Negative for cough and shortness of breath.   Cardiovascular: Negative for chest pain.  Gastrointestinal: Positive for diarrhea, nausea and vomiting. Negative for abdominal pain and constipation.  Genitourinary: Negative for dysuria.  Musculoskeletal: Negative for joint pain.  Neurological: Negative for dizziness and headaches.   Exam: Physical Exam  Constitutional: She is oriented to person, place, and time.  HENT:  Nose: No mucosal edema.  Mouth/Throat: No oropharyngeal exudate or posterior oropharyngeal edema.  Eyes: Conjunctivae, EOM and lids are normal. Pupils are equal, round, and reactive to light.  Neck: No JVD present. Carotid bruit is not present. No edema present. No thyroid mass and no thyromegaly present.  Cardiovascular: S1 normal and S2 normal. Exam reveals no gallop.  No murmur heard. Pulses:      Dorsalis pedis pulses are 2+ on the right side, and 2+ on the left side.  Respiratory: No respiratory distress. She has no wheezes. She has no rhonchi. She has no rales.  GI: Soft. Bowel sounds are normal. There is no tenderness. A hernia is present. Hernia confirmed  positive in the ventral area.  Musculoskeletal:       Right ankle: She exhibits no swelling.       Left ankle: She exhibits no swelling.  Lymphadenopathy:    She has no cervical adenopathy.  Neurological: She is alert and oriented to person, place, and time. No cranial nerve deficit.  Skin: Skin is warm. No rash noted. Nails show no clubbing.  Psychiatric: She has a normal mood and affect.      Data Reviewed: Basic Metabolic Panel: Recent Labs  Lab 01/03/17 1242 01/04/17 0432 01/05/17 0505 01/06/17 0430 01/07/17 0413  NA 134* 136 137  --  136  K 2.4* 2.6* 3.1*  --  3.2*  CL 90* 100* 104  --  106  CO2 27 25 24   --  22  GLUCOSE 103* 105* 94  --  83  BUN 13 13 11   --  9  CREATININE 0.91 0.78 0.75  --  0.62  CALCIUM 9.9 8.8* 8.6*  --  8.6*  MG 1.7  --  2.0 1.8 1.6*  PHOS  --   --  2.3* 2.2* 2.2*   Liver Function Tests: Recent Labs  Lab 01/03/17 1242  AST 25  ALT 15  ALKPHOS 81  BILITOT 1.2  PROT 8.0  ALBUMIN 3.9   Recent Labs  Lab 01/03/17 1242  LIPASE 44   CBC: Recent Labs  Lab 01/03/17 1242 01/04/17 0432  WBC 8.5 6.5  HGB 12.7 11.2*  HCT 37.6 33.9*  MCV 80.6 81.6  PLT 225 170    Scheduled Meds: . amLODipine  10 mg Oral Daily  . folic acid  1 mg Oral Daily  . heparin  5,000 Units Subcutaneous Q8H  . multivitamin  15 mL Oral Daily  . oxybutynin  5 mg Oral QHS  . pantoprazole  40 mg Oral BID AC  . PARoxetine  20 mg Oral Daily  . protein supplement shake  11 oz Oral BID BM  . rosuvastatin  20 mg Oral Daily   Continuous Infusions:   Assessment/Plan:  1. Hypokalemia.  Patient still with low potassium today.  Patient takes Maxide at home which is the likely culprit along with vomiting over the past month.  Stop Maxide.  IV fluids with potassium. Improved some. Cont to check daily. 2. Hypomagnesemia replace magnesium also. 3. Nausea vomiting.  Symptomatic management with nausea medications.  Stop medications that can irritate the stomach including  Naprosyn and meloxicam.          As have diarrhea - checked for stool- c diff and GI panel- negative.      EGD done 01/07/17- have gastritis. GIve PPI BID. 4. 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  Checked hepatitis profiles- negative. 5. Hyperlipidemia unspecified on Crestor 6. Essential hypertension on Norvasc 7. Depression on Paxil 8. Microcytic anemia GI ordered iron studies. 9. Right upper quadrant ventral hernia.  Seen by surgery. No intervention this time.  Code Status:     Code Status Orders  (From admission, onward)        Start     Ordered   01/03/17 1654  Full code  Continuous     01/03/17 1653    Code Status History    Date Active Date Inactive Code Status Order ID Comments User Context   11/25/2015 21:42 11/28/2015 09:32 Full Code [id number]  Lance Coon, MD Inpatient     Family Communication: daughter at the bedside Disposition Plan: Hopefully be able to go home soon  Consultants:  General surgery  Gastroenterology  Time spent: 35 minutes  [place]

## 2017-01-07 NOTE — Transfer of Care (Signed)
Immediate Anesthesia Transfer of Care Note  Patient: Anne [name]  Procedure(s) Performed: ESOPHAGOGASTRODUODENOSCOPY (EGD) WITH PROPOFOL (N/A )  Patient Location: PACU  Anesthesia Type:General  Level of Consciousness: sedated  Airway & Oxygen Therapy: Patient Spontanous Breathing and Patient connected to nasal cannula oxygen  Post-op Assessment: Report given to RN and Post -op Vital signs reviewed and stable  Post vital signs: Reviewed and stable  Last Vitals:  Vitals:   01/07/17 0853 01/07/17 1036  BP: 122/62 128/64  Pulse: 73 77  Resp:  20  Temp:  36.6 C  SpO2:  98%    Last Pain:  Vitals:   01/07/17 1036  TempSrc: Tympanic  PainSc:       Patients Stated Pain Goal: 0 (16/07/37 1062)  Complications: No apparent anesthesia complications

## 2017-01-07 NOTE — Anesthesia Procedure Notes (Signed)
Date/Time: 01/07/2017 10:54 AM Performed by: Johnna Acosta, CRNA Pre-anesthesia Checklist: Patient identified, Emergency Drugs available, Suction available, Patient being monitored and Timeout performed Patient Re-evaluated:Patient Re-evaluated prior to induction Oxygen Delivery Method: Nasal cannula

## 2017-01-07 NOTE — Op Note (Signed)
[facility] Gastroenterology Patient Name: Anne [name] Procedure Date: 01/07/2017 10:50 AM MRN: [id number] Account #: [id number] Date of Birth: 1950/03/25 Admit Type: Inpatient Age: 66 Room: [facility] ENDO ROOM 4 Gender: Female Note Status: Finalized Procedure:            Upper GI endoscopy Indications:          Dysphagia Providers:            Jonathon Bellows MD, MD Referring MD:         Angela Adam. Caryl Bis (Referring MD) Medicines:            Monitored Anesthesia Care Complications:        No immediate complications. Procedure:            Pre-Anesthesia Assessment:                       - Prior to the procedure, a History and Physical was                        performed, and patient medications, allergies and                        sensitivities were reviewed. The patient's tolerance of                        previous anesthesia was reviewed.                       - The risks and benefits of the procedure and the                        sedation options and risks were discussed with the                        patient. All questions were answered and informed                        consent was obtained.                       - ASA Grade Assessment: III - A patient with severe                        systemic disease.                       After obtaining informed consent, the endoscope was                        passed under direct vision. Throughout the procedure,                        the patient's blood pressure, pulse, and oxygen                        saturations were monitored continuously. The Endoscope                        was introduced through the mouth, and advanced to the  third part of duodenum. The upper GI endoscopy was                        accomplished with ease. The patient tolerated the                        procedure well. Findings:      Localized moderate inflammation characterized by congestion (edema),       erosions and  erythema was found in the duodenal bulb. Biopsies were       taken with a cold forceps for histology.      Patchy mild inflammation characterized by congestion (edema), erosions       and erythema was found in the gastric antrum. Biopsies were taken with a       cold forceps for histology.      One mild benign-appearing, intrinsic stenosis was found at the       gastroesophageal junction. This measured 1.6 cm (inner diameter) x less       than one cm (in length) and was traversed. A TTS dilator was passed       through the scope. Dilation with a 15-16.5-18 mm balloon dilator was       performed to 18 mm. The dilation site was examined following endoscope       reinsertion and showed mild improvement in luminal narrowing.      A 6 cm hiatal hernia was present.      LA Grade A (one or more mucosal breaks less than 5 mm, not extending       between tops of 2 mucosal folds) esophagitis with no bleeding was found       in the lower third of the esophagus. Biopsies were taken with a cold       forceps for histology.      Normal mucosa was found in the upper third of the esophagus and in the       middle third of the esophagus. Biopsies were taken with a cold forceps       for histology. Impression:           - Duodenitis. Biopsied.                       - Gastritis. Biopsied.                       - Benign-appearing esophageal stenosis. Dilated.                       - 6 cm hiatal hernia.                       - LA Grade A esophagitis. Biopsied.                       - Normal mucosa was found in the upper third of the                        esophagus and in the middle third of the esophagus.                        Biopsied. Recommendation:       - Return patient to hospital ward for ongoing care.                       -  Advance diet as tolerated.                       - 1. F/u pathology                       2. Continue prilosec 40 mg BID                       3. F/u with Dr Bonna Gains as an  outpatient                       4. IF dysphagia persists obtain esophageal manometry                       5. Keep head end of the bed always at 45 degrees due to                        large hernia and reflux.                       6. Avoid NSAID's Procedure Code(s):    --- Professional ---                       [phone], Esophagogastroduodenoscopy, flexible, transoral;                        with transendoscopic balloon dilation of esophagus                        (less than 30 mm diameter)                       43239, Esophagogastroduodenoscopy, flexible, transoral;                        with biopsy, single or multiple Diagnosis Code(s):    --- Professional ---                       K29.80, Duodenitis without bleeding                       K29.70, Gastritis, unspecified, without bleeding                       K22.2, Esophageal obstruction                       K20.9, Esophagitis, unspecified                       K44.9, Diaphragmatic hernia without obstruction or                        gangrene                       R13.10, Dysphagia, unspecified CPT copyright 2016 [employer] Medical Association. All rights reserved. The codes documented in this report are preliminary and upon coder review may  be revised to meet current compliance requirements. Jonathon Bellows, MD Jonathon Bellows MD, MD 01/07/2017 11:14:52 AM This report has been signed electronically. Number of Addenda: 0 Note Initiated On: 01/07/2017 10:50 AM      [place]  [facility]

## 2017-01-07 NOTE — Anesthesia Post-op Follow-up Note (Signed)
Anesthesia QCDR form completed.        

## 2017-01-08 ENCOUNTER — Encounter: Payer: Self-pay | Admitting: Gastroenterology

## 2017-01-08 LAB — BASIC METABOLIC PANEL
Anion gap: 9 (ref 5–15)
BUN: 11 mg/dL (ref 6–20)
CHLORIDE: 104 mmol/L (ref 101–111)
CO2: 21 mmol/L — ABNORMAL LOW (ref 22–32)
Calcium: 8.5 mg/dL — ABNORMAL LOW (ref 8.9–10.3)
Creatinine, Ser: 0.78 mg/dL (ref 0.44–1.00)
GFR calc Af Amer: >60 mL/min (ref >60)
GFR calc non Af Amer: >60 mL/min (ref >60)
GLUCOSE: 78 mg/dL (ref 65–99)
POTASSIUM: 3.4 mmol/L — AB (ref 3.5–5.1)
SODIUM: 134 mmol/L — AB (ref 135–145)

## 2017-01-08 LAB — MAGNESIUM: MAGNESIUM: 1.9 mg/dL (ref 1.7–2.4)

## 2017-01-08 LAB — PHOSPHORUS: Phosphorus: 2.4 mg/dL — ABNORMAL LOW (ref 2.5–4.6)

## 2017-01-08 MED ORDER — FOLIC ACID 1 MG PO TABS: 1.0000 mg | ORAL_TABLET | Freq: Every day | ORAL | 0 refills | Status: DC

## 2017-01-08 MED ORDER — ONDANSETRON HCL 4 MG PO TABS: 4.0000 mg | ORAL_TABLET | Freq: Three times a day (TID) | ORAL | 1 refills | Status: DC | PRN

## 2017-01-08 MED ORDER — PANTOPRAZOLE SODIUM 40 MG PO TBEC: 40.0000 mg | DELAYED_RELEASE_TABLET | Freq: Two times a day (BID) | ORAL | 0 refills | Status: DC

## 2017-01-08 NOTE — Progress Notes (Signed)
Per MD okay for RN no to give amlopidine. BP 90/46

## 2017-01-08 NOTE — Progress Notes (Signed)
Anne Godfrey Areola  [name] and O x 4. VSS. Pt tolerating diet well. No complaints of pain or nausea. IV removed intact, prescriptions given. Pt voiced understanding of discharge instructions with no further questions. Pt discharged via wheelchair with axillary.    Allergies as of 01/08/2017   No Known Allergies     Medication List    STOP taking these medications   amLODipine 10 MG tablet Commonly known as:  NORVASC   triamterene-hydrochlorothiazide 37.5-25 MG tablet Commonly known as:  MAXZIDE-25     TAKE these medications   EYE HEALTH FORMULA PO Take by mouth.   FISH OIL + D3 1000-1000 MG-UNIT Caps Take by mouth.   folic acid 1 MG tablet Commonly known as:  FOLVITE Take 1 tablet (1 mg total) by mouth daily. Start taking on:  01/09/2017   meloxicam 15 MG tablet Commonly known as:  MOBIC Take 1 tablet (15 mg total) by mouth daily.   naproxen 500 MG tablet Commonly known as:  NAPROSYN Take 1 tablet (500 mg total) by mouth 2 (two) times daily with [name] meal.   ondansetron 4 MG tablet Commonly known as:  ZOFRAN Take 1 tablet (4 mg total) by mouth every 8 (eight) hours as needed for nausea or vomiting.   oxybutynin 5 MG 24 hr tablet Commonly known as:  DITROPAN-XL TAKE 1 TABLET BY MOUTH AT BEDTIME   pantoprazole 40 MG tablet Commonly known as:  PROTONIX Take 1 tablet (40 mg total) by mouth 2 (two) times daily before [name] meal.   PARoxetine 20 MG tablet Commonly known as:  PAXIL TAKE ONE TABLET BY MOUTH ONCE DAILY   potassium chloride 20 MEQ packet Commonly known as:  KLOR-CON Take 20 mEq 2 (two) times daily by mouth.   promethazine 12.5 MG tablet Commonly known as:  PHENERGAN Take 1 tablet (12.5 mg total) by mouth every 8 (eight) hours as needed for nausea or vomiting.   rosuvastatin 20 MG tablet Commonly known as:  CRESTOR Take 1 tablet (20 mg total) by mouth daily.   St Johns Wort 150 MG Caps Take 1 capsule by mouth daily.       Vitals:   01/08/17 1026 01/08/17  1239  BP: (!) 90/46 (!) 108/54  Pulse: 74 74  Resp:    Temp:  (!) 97.5 F (36.4 C)  SpO2:  100%    Francesco Sor

## 2017-01-08 NOTE — Discharge Summary (Signed)
Anne [name] at [place] NAME: Anne [name]    MR#:  [id number]  DATE OF BIRTH:  1950/02/28  DATE OF ADMISSION:  01/03/2017 ADMITTING PHYSICIAN: Vaughan Basta, MD  DATE OF DISCHARGE: 01/08/2017  PRIMARY CARE PHYSICIAN: Leone Haven, MD    ADMISSION DIAGNOSIS:  Dehydration [E86.0] Acute hypokalemia [E87.6] Acute gastritis without hemorrhage, unspecified gastritis type [K29.00]  DISCHARGE DIAGNOSIS:  Principal Problem:   Hypokalemia    Gastritis  SECONDARY DIAGNOSIS:   Past Medical History:  Diagnosis Date  . Arthritis   . Depression   . Hyperlipidemia   . Hypertension     HOSPITAL COURSE:   1. Hypokalemia.  Patient still with low potassium today.  Patient takes Maxide at home which is the likely culprit along with vomiting over the past month.  Stop Maxide.  IV fluids with potassium. Improved some. Cont to check daily. 2. Hypomagnesemia replace magnesium also. 3. Gastritis and duodenitis- Nausea vomiting.  Symptomatic management with nausea medications.  Stop medications that can irritate the stomach including Naprosyn and meloxicam.          As have diarrhea - checked for stool- c diff and GI panel- negative.      EGD done 01/07/17- have gastritis. GIve PPI BID. Follow in GI clinic. 4. 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  Checked hepatitis profiles- negative. 5. Hyperlipidemia unspecified on Crestor 6. Essential hypertension on Norvasc- hold on d/c as BP running low normal. 7. Depression on Paxil 8. Microcytic anemia GI ordered iron studies. 9. Right upper quadrant ventral hernia.  Seen by surgery. No intervention this time.    DISCHARGE CONDITIONS:   Stable.  CONSULTS OBTAINED:  Treatment Team:  Vickie Epley, MD Jules Husbands, MD Lucilla Lame, MD  DRUG ALLERGIES:  No Known Allergies  DISCHARGE MEDICATIONS:   Allergies as of 01/08/2017   No Known Allergies     Medication List    STOP taking  these medications   amLODipine 10 MG tablet Commonly known as:  NORVASC   triamterene-hydrochlorothiazide 37.5-25 MG tablet Commonly known as:  MAXZIDE-25     TAKE these medications   EYE HEALTH FORMULA PO Take by mouth.   FISH OIL + D3 1000-1000 MG-UNIT Caps Take by mouth.   folic acid 1 MG tablet Commonly known as:  FOLVITE Take 1 tablet (1 mg total) by mouth daily. Start taking on:  01/09/2017   meloxicam 15 MG tablet Commonly known as:  MOBIC Take 1 tablet (15 mg total) by mouth daily.   naproxen 500 MG tablet Commonly known as:  NAPROSYN Take 1 tablet (500 mg total) by mouth 2 (two) times daily with a meal.   ondansetron 4 MG tablet Commonly known as:  ZOFRAN Take 1 tablet (4 mg total) by mouth every 8 (eight) hours as needed for nausea or vomiting.   oxybutynin 5 MG 24 hr tablet Commonly known as:  DITROPAN-XL TAKE 1 TABLET BY MOUTH AT BEDTIME   pantoprazole 40 MG tablet Commonly known as:  PROTONIX Take 1 tablet (40 mg total) by mouth 2 (two) times daily before a meal.   PARoxetine 20 MG tablet Commonly known as:  PAXIL TAKE ONE TABLET BY MOUTH ONCE DAILY   potassium chloride 20 MEQ packet Commonly known as:  KLOR-CON Take 20 mEq 2 (two) times daily by mouth.   promethazine 12.5 MG tablet Commonly known as:  PHENERGAN Take 1 tablet (12.5 mg total) by mouth every 8 (eight) hours  as needed for nausea or vomiting.   rosuvastatin 20 MG tablet Commonly known as:  CRESTOR Take 1 tablet (20 mg total) by mouth daily.   St Johns Wort 150 MG Caps Take 1 capsule by mouth daily.        DISCHARGE INSTRUCTIONS:    Follow with GI clinic in 2 weeks.  If you experience worsening of your admission symptoms, develop shortness of breath, life threatening emergency, suicidal or homicidal thoughts you must seek medical attention immediately by calling 911 or calling your MD immediately  if symptoms less severe.  You Must read complete instructions/literature  along with all the possible adverse reactions/side effects for all the Medicines you take and that have been prescribed to you. Take any new Medicines after you have completely understood and accept all the possible adverse reactions/side effects.   Please note  You were cared for by a hospitalist during your hospital stay. If you have any questions about your discharge medications or the care you received while you were in the hospital after you are discharged, you can call the unit and asked to speak with the hospitalist on call if the hospitalist that took care of you is not available. Once you are discharged, your primary care physician will handle any further medical issues. Please note that NO REFILLS for any discharge medications will be authorized once you are discharged, as it is imperative that you return to your primary care physician (or establish a relationship with a primary care physician if you do not have one) for your aftercare needs so that they can reassess your need for medications and monitor your lab values.    Today   CHIEF COMPLAINT:   Chief Complaint  Patient presents with  . Emesis  . Chest Pain    HISTORY OF PRESENT ILLNESS:  Anne [name]  is a 66 y.o. female with a known history of Arthritis, Htn, HLD- have persistent vomitign or 1 mth, multiple work up done by PMD, yesterday ordered CT abd- which showed a large hernea. Pt's K is also low. So PMD suggested to go to ER> She denies blood in vomit. No abd pain. No diarrhea. Not able to eat any or her meds for few weeks. Feels very weak.   VITAL SIGNS:  Blood pressure (!) 90/46, pulse 74, temperature 97.9 F (36.6 C), temperature source Oral, resp. rate 19, height 5\' 6"  (1.676 m), weight 79.4 kg (175 lb), SpO2 97 %.  I/O:    Intake/Output Summary (Last 24 hours) at 01/08/2017 1058 Last data filed at 01/08/2017 0946 Gross per 24 hour  Intake 480 ml  Output 700 ml  Net -220 ml    PHYSICAL EXAMINATION:   GENERAL:  66 y.o.-year-old patient lying in the bed with no acute distress.  EYES: Pupils equal, round, reactive to light and accommodation. No scleral icterus. Extraocular muscles intact.  HEENT: Head atraumatic, normocephalic. Oropharynx and nasopharynx clear.  NECK:  Supple, no jugular venous distention. No thyroid enlargement, no tenderness.  LUNGS: Normal breath sounds bilaterally, no wheezing, rales,rhonchi or crepitation. No use of accessory muscles of respiration.  CARDIOVASCULAR: S1, S2 normal. No murmurs, rubs, or gallops.  ABDOMEN: Soft, non-tender, non-distended. Bowel sounds present. No organomegaly or mass.  EXTREMITIES: No pedal edema, cyanosis, or clubbing.  NEUROLOGIC: Cranial nerves II through XII are intact. Muscle strength 5/5 in all extremities. Sensation intact. Gait not checked.  PSYCHIATRIC: The patient is alert and oriented x 3.  SKIN: No obvious rash, lesion, or  ulcer.   DATA REVIEW:   CBC Recent Labs  Lab 01/04/17 0432  WBC 6.5  HGB 11.2*  HCT 33.9*  PLT 170    Chemistries  Recent Labs  Lab 01/03/17 1242  01/08/17 0516  NA 134*   < > 134*  K 2.4*   < > 3.4*  CL 90*   < > 104  CO2 27   < > 21*  GLUCOSE 103*   < > 78  BUN 13   < > 11  CREATININE 0.91   < > 0.78  CALCIUM 9.9   < > 8.5*  MG 1.7   < > 1.9  AST 25  --   --   ALT 15  --   --   ALKPHOS 81  --   --   BILITOT 1.2  --   --    < > = values in this interval not displayed.    Cardiac Enzymes No results for input(s): TROPONINI in the last 168 hours.  Microbiology Results  Results for orders placed or performed during the hospital encounter of 01/03/17  C difficile quick scan w PCR reflex     Status: None   Collection Time: 01/06/17  3:11 PM  Result Value Ref Range Status   C Diff antigen NEGATIVE NEGATIVE Final   C Diff toxin NEGATIVE NEGATIVE Final   C Diff interpretation No C. difficile detected.  Final  Gastrointestinal Panel by PCR , Stool     Status: None   Collection Time:  01/06/17  7:39 PM  Result Value Ref Range Status   Campylobacter species NOT DETECTED NOT DETECTED Final   Plesimonas shigelloides NOT DETECTED NOT DETECTED Final   Salmonella species NOT DETECTED NOT DETECTED Final   Yersinia enterocolitica NOT DETECTED NOT DETECTED Final   Vibrio species NOT DETECTED NOT DETECTED Final   Vibrio cholerae NOT DETECTED NOT DETECTED Final   Enteroaggregative E coli (EAEC) NOT DETECTED NOT DETECTED Final   Enteropathogenic E coli (EPEC) NOT DETECTED NOT DETECTED Final   Enterotoxigenic E coli (ETEC) NOT DETECTED NOT DETECTED Final   Shiga like toxin producing E coli (STEC) NOT DETECTED NOT DETECTED Final   Shigella/Enteroinvasive E coli (EIEC) NOT DETECTED NOT DETECTED Final   Cryptosporidium NOT DETECTED NOT DETECTED Final   Cyclospora cayetanensis NOT DETECTED NOT DETECTED Final   Entamoeba histolytica NOT DETECTED NOT DETECTED Final   Giardia lamblia NOT DETECTED NOT DETECTED Final   Adenovirus F40/41 NOT DETECTED NOT DETECTED Final   Astrovirus NOT DETECTED NOT DETECTED Final   Norovirus GI/GII NOT DETECTED NOT DETECTED Final   Rotavirus A NOT DETECTED NOT DETECTED Final   Sapovirus (I, II, IV, and V) NOT DETECTED NOT DETECTED Final    RADIOLOGY:  No results found.  EKG:   Orders placed or performed in visit on 01/03/17  . EKG 12-Lead      Management plans discussed with the patient, family and they are in agreement.  CODE STATUS:     Code Status Orders  (From admission, onward)        Start     Ordered   01/03/17 1654  Full code  Continuous     01/03/17 1653    Code Status History    Date Active Date Inactive Code Status Order ID Comments User Context   11/25/2015 21:42 11/28/2015 09:32 Full Code [id number]  Lance Coon, MD Inpatient      TOTAL TIME TAKING CARE OF THIS PATIENT: 35 minutes.  Vaughan Basta M.D on 01/08/2017 at 10:58 AM  Between 7am to 6pm - Pager - [phone]  After 6pm go to [email or web address] -  password [email or web address] [facility]  Office  [phone]  CC: Primary care physician; Leone Haven, MD   Note: This dictation was prepared with Dragon dictation along with smaller phrase technology. Any transcriptional errors that result from this process are unintentional.

## 2017-01-09 LAB — SURGICAL PATHOLOGY

## 2017-01-10 ENCOUNTER — Telehealth: Payer: Self-pay | Admitting: Family Medicine

## 2017-01-10 NOTE — Telephone Encounter (Signed)
Transition Care Management Follow-up Telephone Call How have you been since you were released from the hospital? Patient stated she is feeling much better and able to eat.  Do you understand why you were in the hospital? yes   Do you understand the discharge instrcutions? yes  Items Reviewed:  Medications reviewed: yes  Allergies reviewed: yes  Dietary changes reviewed: yes  Referrals reviewed: yes   Functional Questionnaire:   Activities of Daily Living (ADLs):   She states they are independent in the following: ambulation, bathing and hygiene, feeding, continence, grooming, toileting and dressing States they require assistance with the following:no assistance needed.   Any transportation issues/concerns?: no   Any patient concerns? no   Confirmed importance and date/time of follow-up visits scheduled: yes   Confirmed with patient if condition begins to worsen call PCP or go to the ER.  Patient was given the Call-a-Nurse line [phone]: yes

## 2017-01-11 ENCOUNTER — Telehealth: Payer: Self-pay | Admitting: Family Medicine

## 2017-01-11 NOTE — Telephone Encounter (Signed)
Pt is scheduled °

## 2017-01-11 NOTE — Telephone Encounter (Signed)
fyi

## 2017-01-11 NOTE — Telephone Encounter (Signed)
Please schedule

## 2017-01-11 NOTE — Anesthesia Preprocedure Evaluation (Signed)
Anesthesia Evaluation  Patient identified by MRN, date of birth, ID band Patient awake    Reviewed: Allergy & Precautions, H&P , NPO status , Patient's Chart, lab work & pertinent test results, reviewed documented beta blocker date and time   Airway Mallampati: II   Neck ROM: full    Dental  (+) Poor Dentition   Pulmonary neg pulmonary ROS, former smoker,    Pulmonary exam normal        Cardiovascular hypertension, negative cardio ROS Normal cardiovascular exam Rhythm:regular Rate:Normal     Neuro/Psych negative neurological ROS  negative psych ROS   GI/Hepatic negative GI ROS, Neg liver ROS,   Endo/Other  negative endocrine ROS  Renal/GU negative Renal ROS  negative genitourinary   Musculoskeletal   Abdominal   Peds  Hematology negative hematology ROS (+)   Anesthesia Other Findings Past Medical History: No date: Arthritis No date: Depression No date: Hyperlipidemia No date: Hypertension Past Surgical History: 1971: APPENDECTOMY 1971: CHOLECYSTECTOMY 11/15/2016: COLONOSCOPY WITH PROPOFOL; N/A     Comment:  Procedure: COLONOSCOPY WITH PROPOFOL;  Surgeon: Jonathon Bellows, MD;  Location: [facility] ENDOSCOPY;  Service:               Gastroenterology;  Laterality: N/A; 01/07/2017: ESOPHAGOGASTRODUODENOSCOPY (EGD) WITH PROPOFOL; N/A     Comment:  Procedure: ESOPHAGOGASTRODUODENOSCOPY (EGD) WITH               PROPOFOL;  Surgeon: Jonathon Bellows, MD;  Location: [facility]               ENDOSCOPY;  Service: Gastroenterology;  Laterality: N/A; 1955: TONSILECTOMY, ADENOIDECTOMY, BILATERAL MYRINGOTOMY AND TUBES BMI    Body Mass Index:  28.25 kg/m     Reproductive/Obstetrics negative OB ROS                             Anesthesia Physical Anesthesia Plan  ASA: II  Anesthesia Plan: General   Post-op Pain Management:    Induction:   PONV Risk Score and Plan:   Airway Management Planned:    Additional Equipment:   Intra-op Plan:   Post-operative Plan:   Informed Consent: I have reviewed the patients History and Physical, chart, labs and discussed the procedure including the risks, benefits and alternatives for the proposed anesthesia with the patient or authorized representative who has indicated his/her understanding and acceptance.   Dental Advisory Given  Plan Discussed with: CRNA  Anesthesia Plan Comments:         Anesthesia Quick Evaluation

## 2017-01-11 NOTE — Anesthesia Postprocedure Evaluation (Signed)
Anesthesia Post Note  Patient: Anne [name]  Procedure(s) Performed: ESOPHAGOGASTRODUODENOSCOPY (EGD) WITH PROPOFOL (N/A )  Patient location during evaluation: PACU Anesthesia Type: General Level of consciousness: awake and alert Pain management: pain level controlled Vital Signs Assessment: post-procedure vital signs reviewed and stable Respiratory status: spontaneous breathing, nonlabored ventilation, respiratory function stable and patient connected to nasal cannula oxygen Cardiovascular status: blood pressure returned to baseline and stable Postop Assessment: no apparent nausea or vomiting Anesthetic complications: no     Last Vitals:  Vitals:   01/08/17 1026 01/08/17 1239  BP: (!) 90/46 (!) 108/54  Pulse: 74 74  Resp:    Temp:  (!) 36.4 C  SpO2:  100%    Last Pain:  Vitals:   01/08/17 1239  TempSrc: Oral  PainSc:                  [name]

## 2017-01-11 NOTE — Telephone Encounter (Signed)
You could do next Thursday at 12:00 pm.

## 2017-01-11 NOTE — Telephone Encounter (Signed)
Pt HFU was resch to 02/07/2017 I know the appts have to be scheduled within 14 days. No appt avail to sch closer. Please advise. Thank you!

## 2017-01-14 ENCOUNTER — Ambulatory Visit: Payer: PPO | Admitting: Gastroenterology

## 2017-01-14 ENCOUNTER — Inpatient Hospital Stay: Payer: PPO | Admitting: Family Medicine

## 2017-01-17 ENCOUNTER — Ambulatory Visit (INDEPENDENT_AMBULATORY_CARE_PROVIDER_SITE_OTHER): Payer: PPO | Admitting: Gastroenterology

## 2017-01-17 ENCOUNTER — Encounter: Payer: Self-pay | Admitting: Gastroenterology

## 2017-01-17 VITALS — BP 112/65 | HR 82 | Temp 97.8°F | Ht 66.0 in | Wt 177.0 lb

## 2017-01-17 DIAGNOSIS — K219 Gastro-esophageal reflux disease without esophagitis: Secondary | ICD-10-CM | POA: Diagnosis not present

## 2017-01-17 DIAGNOSIS — R4702 Dysphasia: Secondary | ICD-10-CM

## 2017-01-17 DIAGNOSIS — K7460 Unspecified cirrhosis of liver: Secondary | ICD-10-CM

## 2017-01-17 DIAGNOSIS — K3189 Other diseases of stomach and duodenum: Secondary | ICD-10-CM | POA: Diagnosis not present

## 2017-01-17 DIAGNOSIS — K31A0 Gastric intestinal metaplasia, unspecified: Secondary | ICD-10-CM

## 2017-01-17 NOTE — Progress Notes (Signed)
Jonathon Bellows MD, MRCP(U.K) [address]  [place]  [place], [place] [address]  Main: [phone]  Fax: [phone]   Primary Care Physician: Leone Haven, MD  Primary Gastroenterologist:  Dr. Jonathon Bellows   No chief complaint on file.   HPI: Anne [name] is a 66 y.o. female   Summary of history :  She was seen by my colleague Dr. Bonna Gains on 12/24/2016 for dysphagia and weight loss.  Dysphagia had began a year back and had worsened over the last month.  Weight loss of 40 pounds over the past 1 year.  Colonoscopy in October 2018 by myself showed that she had 3 polyps taken out less than 10 mm in size and 2 of them with adenomatous tissue.  On 01/02/2017 she underwent a CT scan of the abdomen chest and pelvis which showed a large  hernia that contained a loop of the transverse colon with no incarceration or obstruction.  5 mm nodule in the left lobe of the lung.  Features suggestive of liver cirrhosis.  He presented to the hospital on 01/03/2017 with vomiting severely low electrolytes.  There was a history of dysphagia.  Evaluation was noted to have a low folate.  An upper endoscopy was performed on 01/07/2017 information was seen in the duodenal bulb as well as the gastric antrum.  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  Centimeter hiatal hernia was noted.  LA grade B for vaginitis was seen in the lower end of the esophagus and biopsies were taken for histology.  The normal mucosal seen in the upper and lower to middle third of the esophagus and biopsies were taken to rule out eosinophilic esophagitis.  Biopsies of the duodenum showed mild chronic active duodenitis.  Biopsies of the stomach showed moderate chronic gastritis which were negative H. pylori.  Biopsies of the GE junction showed moderate chronic active gastritis with focus in the time of metaplasia.  Random esophageal biopsies showed no eosinophils negative for fungus or yeast showed  nonspecific esophagitis.   Interval history 12/2016  to 01/17/2017  1.  Dysphagia- no issues with swallowing . Taking Prilosec 40 mg daily.  2.  Weight: stable since 1 month , weighed 230 lbs in the past. Did not drink much alcohol in the past  . Has a tatoo on her back .No military service.,    BP 112/65 (BP Location: Left Arm, Patient Position: Sitting, Cuff Size: Normal)   Pulse 82   Temp 97.8 F (36.6 C) (Oral)   Ht 5\' 6"  (1.676 m)   Wt 177 lb (80.3 kg)   BMI 28.57 kg/m    Current Outpatient Medications  Medication Sig Dispense Refill  . DHA-Vitamin C-Lutein (EYE HEALTH FORMULA PO) Take by mouth.    . Fish Oil-Cholecalciferol (FISH OIL + D3) 1000-1000 MG-UNIT CAPS Take by mouth.    . folic acid (FOLVITE) 1 MG tablet Take 1 tablet (1 mg total) by mouth daily. 30 tablet 0  . meloxicam (MOBIC) 15 MG tablet Take 1 tablet (15 mg total) by mouth daily. 30 tablet 1  . naproxen (NAPROSYN) 500 MG tablet Take 1 tablet (500 mg total) by mouth 2 (two) times daily with a meal. 30 tablet 0  . ondansetron (ZOFRAN) 4 MG tablet Take 1 tablet (4 mg total) by mouth every 8 (eight) hours as needed for nausea or vomiting. 30 tablet 1  . oxybutynin (DITROPAN-XL) 5 MG 24 hr tablet TAKE  1 TABLET BY MOUTH AT BEDTIME 90 tablet 1  . pantoprazole (PROTONIX) 40 MG tablet Take 1 tablet (40 mg total) by mouth 2 (two) times daily before a meal. 60 tablet 0  . PARoxetine (PAXIL) 20 MG tablet TAKE ONE TABLET BY MOUTH ONCE DAILY 90 tablet 2  . potassium chloride (KLOR-CON) 20 MEQ packet Take 20 mEq 2 (two) times daily by mouth. 14 packet 0  . promethazine (PHENERGAN) 12.5 MG tablet Take 1 tablet (12.5 mg total) by mouth every 8 (eight) hours as needed for nausea or vomiting. 20 tablet 0  . rosuvastatin (CRESTOR) 20 MG tablet Take 1 tablet (20 mg total) by mouth daily. 90 tablet 3  . St Johns Wort 150 MG CAPS Take 1 capsule by mouth daily.     No current facility-administered medications for this visit.      Allergies as of 01/17/2017  . (No Known Allergies)    ROS:  General: Negative for anorexia, weight loss, fever, chills, fatigue, weakness. ENT: Negative for hoarseness, difficulty swallowing , nasal congestion. CV: Negative for chest pain, angina, palpitations, dyspnea on exertion, peripheral edema.  Respiratory: Negative for dyspnea at rest, dyspnea on exertion, cough, sputum, wheezing.  GI: See history of present illness. GU:  Negative for dysuria, hematuria, urinary incontinence, urinary frequency, nocturnal urination.  Endo: Negative for unusual weight change.    Physical Examination:   There were no vitals taken for this visit.  General: Well-nourished, well-developed in no acute distress.  Eyes: No icterus. Conjunctivae pink. Mouth: Oropharyngeal mucosa moist and pink , no lesions erythema or exudate. Lungs: Clear to auscultation bilaterally. Non-labored. Heart: Regular rate and rhythm, no murmurs rubs or gallops.  Abdomen: Bowel sounds are normal, nontender, nondistended, no hepatosplenomegaly or masses, no abdominal bruits or hernia , no rebound or guarding.   Extremities: No lower extremity edema. No clubbing or deformities. Neuro: Alert and oriented x 3.  Grossly intact. Skin: Warm and dry, no jaundice.   Psych: Alert and cooperative, normal mood and affect.   Imaging Studies: Dg Chest 2 View  Result Date: 12/20/2016 CLINICAL DATA:  40 pound weight loss over 5 months. Fatigue. Former smoker. EXAM: CHEST  2 VIEW COMPARISON:  None. FINDINGS: Cardiomediastinal silhouette is normal. Calcified aortic knob. No pleural effusions or focal consolidations. Strandy densities LEFT lung base. Trachea projects midline and there is no pneumothorax. Soft tissue planes and included osseous structures are non-suspicious. Surgical clips in the included right abdomen compatible with cholecystectomy. IMPRESSION: LEFT lung base atelectasis/scarring. Electronically Signed   By: Elon Alas M.D.   On: 12/20/2016 15:48   Ct Chest W Contrast  Result Date: 01/02/2017 CLINICAL DATA:  Nausea, vomiting, weight loss. EXAM: CT CHEST, ABDOMEN, AND PELVIS WITH CONTRAST TECHNIQUE: Multidetector CT imaging of the chest, abdomen and pelvis was performed following the standard protocol during bolus administration of intravenous contrast. CONTRAST:  152mL ISOVUE-300 IOPAMIDOL (ISOVUE-300) INJECTION 61% COMPARISON:  None. FINDINGS: CT CHEST FINDINGS Cardiovascular: Atherosclerosis of thoracic aorta is noted without aneurysm or dissection. No pericardial effusion is noted. Normal cardiac size. Coronary artery calcifications are noted. Mediastinum/Nodes: No enlarged mediastinal, hilar, or axillary lymph nodes. Thyroid gland, trachea, and esophagus demonstrate no significant findings. Lungs/Pleura: No pneumothorax or pleural effusion is noted. Right lung is clear. 5 mm nodule is noted in superior segment of left lower lobe best seen on image number 71 of series 4. Musculoskeletal: No chest wall mass or suspicious bone lesions identified. CT ABDOMEN PELVIS FINDINGS Hepatobiliary: Status post  cholecystectomy. Mildly nodular hepatic margins are noted concerning for hepatic cirrhosis. No focal abnormality is noted. Pancreas: Unremarkable. No pancreatic ductal dilatation or surrounding inflammatory changes. Spleen: Normal in size without focal abnormality. Adrenals/Urinary Tract: Adrenal glands are unremarkable. Kidneys are normal, without renal calculi, focal lesion, or hydronephrosis. Bladder is unremarkable. Stomach/Bowel: The stomach appears normal. Status post appendectomy. There is no evidence of bowel obstruction or inflammation. There is noted a large hernia in the right upper quadrant of the abdomen which contains a loop of transverse colon, but does not result in incarceration or obstruction. Vascular/Lymphatic: Aortic atherosclerosis. No enlarged abdominal or pelvic lymph nodes. Reproductive: Uterus  and bilateral adnexa are unremarkable. Other: No abnormal fluid collection is noted. Musculoskeletal: No acute or significant osseous findings. IMPRESSION: Atherosclerosis of thoracic and abdominal aorta is noted without aneurysm or dissection. Large hernia is noted in right upper quadrant of abdomen which contains a loop of transverse colon, but does not result in incarceration or obstruction. 5 mm nodule is noted in superior segment of left lower lobe. No follow-up needed if patient is low-risk. Non-contrast chest CT can be considered in 12 months if patient is high-risk. This recommendation follows the consensus statement: Guidelines for Management of Incidental Pulmonary Nodules Detected on CT Images: From the Fleischner Society 2017; Radiology 2017; 284:228-243. Findings are concerning for hepatic cirrhosis. No other abnormality is noted. Electronically Signed   By: Marijo Conception, M.D.   On: 01/02/2017 14:54   Ct Abdomen Pelvis W Contrast  Result Date: 01/02/2017 CLINICAL DATA:  Nausea, vomiting, weight loss. EXAM: CT CHEST, ABDOMEN, AND PELVIS WITH CONTRAST TECHNIQUE: Multidetector CT imaging of the chest, abdomen and pelvis was performed following the standard protocol during bolus administration of intravenous contrast. CONTRAST:  18mL ISOVUE-300 IOPAMIDOL (ISOVUE-300) INJECTION 61% COMPARISON:  None. FINDINGS: CT CHEST FINDINGS Cardiovascular: Atherosclerosis of thoracic aorta is noted without aneurysm or dissection. No pericardial effusion is noted. Normal cardiac size. Coronary artery calcifications are noted. Mediastinum/Nodes: No enlarged mediastinal, hilar, or axillary lymph nodes. Thyroid gland, trachea, and esophagus demonstrate no significant findings. Lungs/Pleura: No pneumothorax or pleural effusion is noted. Right lung is clear. 5 mm nodule is noted in superior segment of left lower lobe best seen on image number 71 of series 4. Musculoskeletal: No chest wall mass or suspicious bone  lesions identified. CT ABDOMEN PELVIS FINDINGS Hepatobiliary: Status post cholecystectomy. Mildly nodular hepatic margins are noted concerning for hepatic cirrhosis. No focal abnormality is noted. Pancreas: Unremarkable. No pancreatic ductal dilatation or surrounding inflammatory changes. Spleen: Normal in size without focal abnormality. Adrenals/Urinary Tract: Adrenal glands are unremarkable. Kidneys are normal, without renal calculi, focal lesion, or hydronephrosis. Bladder is unremarkable. Stomach/Bowel: The stomach appears normal. Status post appendectomy. There is no evidence of bowel obstruction or inflammation. There is noted a large hernia in the right upper quadrant of the abdomen which contains a loop of transverse colon, but does not result in incarceration or obstruction. Vascular/Lymphatic: Aortic atherosclerosis. No enlarged abdominal or pelvic lymph nodes. Reproductive: Uterus and bilateral adnexa are unremarkable. Other: No abnormal fluid collection is noted. Musculoskeletal: No acute or significant osseous findings. IMPRESSION: Atherosclerosis of thoracic and abdominal aorta is noted without aneurysm or dissection. Large hernia is noted in right upper quadrant of abdomen which contains a loop of transverse colon, but does not result in incarceration or obstruction. 5 mm nodule is noted in superior segment of left lower lobe. No follow-up needed if patient is low-risk. Non-contrast chest CT can be considered in  12 months if patient is high-risk. This recommendation follows the consensus statement: Guidelines for Management of Incidental Pulmonary Nodules Detected on CT Images: From the Fleischner Society 2017; Radiology 2017; 284:228-243. Findings are concerning for hepatic cirrhosis. No other abnormality is noted. Electronically Signed   By: Marijo Conception, M.D.   On: 01/02/2017 14:54    Assessment and Plan:   Anne [name] is a 66 y.o. y/o female here to follow up for dysphagia likely  seocndary to stricture from GERD and incidental finding of liver cirrhosis on CT abdomen . He also has a large hiatal hernia.   Plan  1.  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  2.  For her reflux suggest to continue lifestyle changes, patient information provided, keep head end of the bed elevated at 45 degrees.  Continues to take proton pump inhibitor first thing in the morning on an empty stomach and 30 minutes after. 3.  For the incidental finding of cirrhosis, I would rule out viral hepatitis and autoimmune hepatitis .  She will need imaging of the liver every 6 months to screen for hepatocellular carcinoma.  Repeat upper endoscopy in 3 years to screen for esophageal varices.  I will check hepatitis a and B serology and advised on vaccinations subsequently.Presently labs suggest good liver function   I have discussed risks & benefits of the procedure  which include, but are not limited to, bleeding, infection, perforation,respiratory compromise & drug reaction.  The patient agrees with this plan & written consent will be obtained.     Dr Jonathon Bellows  MD,MRCP [facility]) Follow up in 6 months

## 2017-01-18 LAB — ANTI-MICROSOMAL ANTIBODY LIVER / KIDNEY: LKM1 AB: 0.4 U (ref 0.0–20.0)

## 2017-01-18 LAB — PROTIME-INR
INR: 1.1 (ref 0.8–1.2)
PROTHROMBIN TIME: 10.9 s (ref 9.1–12.0)

## 2017-01-20 LAB — ALPHA-1-ANTITRYPSIN: A-1 Antitrypsin: 198 mg/dL (ref 90–200)

## 2017-01-20 LAB — HEPATITIS A ANTIBODY, TOTAL: Hep A Total Ab: POSITIVE — AB

## 2017-01-20 LAB — MITOCHONDRIAL/SMOOTH MUSCLE AB PNL
Mitochondrial Ab: 7.7 Units (ref 0.0–20.0)
Smooth Muscle Ab: 15 Units (ref 0–19)

## 2017-01-20 LAB — IMMUNOGLOBULINS A/E/G/M, SERUM
IGG (IMMUNOGLOBIN G), SERUM: 1003 mg/dL (ref 700–1600)
IgA/Immunoglobulin A, Serum: 176 mg/dL (ref 87–352)
IgE (Immunoglobulin E), Serum: 385 IU/mL — ABNORMAL HIGH (ref 0–100)
IgM (Immunoglobulin M), Srm: 401 mg/dL — ABNORMAL HIGH (ref 26–217)

## 2017-01-20 LAB — HIV ANTIBODY (ROUTINE TESTING W REFLEX): HIV SCREEN 4TH GENERATION: NONREACTIVE

## 2017-01-20 LAB — ANTI-MICROSOMAL ANTIBODY LIVER / KIDNEY: LKM1 AB: 0.7 U (ref 0.0–20.0)

## 2017-01-20 LAB — CERULOPLASMIN: Ceruloplasmin: 21.7 mg/dL (ref 19.0–39.0)

## 2017-01-20 LAB — HEPATITIS B E ANTIGEN: HEP B E AG: NEGATIVE

## 2017-01-20 LAB — HEPATITIS B E ANTIBODY: HEP B E AB: NEGATIVE

## 2017-01-22 ENCOUNTER — Other Ambulatory Visit: Payer: Self-pay | Admitting: Family Medicine

## 2017-01-23 ENCOUNTER — Encounter: Payer: Self-pay | Admitting: Family Medicine

## 2017-01-23 ENCOUNTER — Telehealth: Payer: Self-pay | Admitting: Internal Medicine

## 2017-01-23 ENCOUNTER — Ambulatory Visit: Payer: PPO | Admitting: Family Medicine

## 2017-01-23 VITALS — BP 128/72 | HR 83 | Temp 97.8°F | Wt 171.4 lb

## 2017-01-23 DIAGNOSIS — D649 Anemia, unspecified: Secondary | ICD-10-CM

## 2017-01-23 DIAGNOSIS — K7460 Unspecified cirrhosis of liver: Secondary | ICD-10-CM | POA: Diagnosis not present

## 2017-01-23 DIAGNOSIS — F3341 Major depressive disorder, recurrent, in partial remission: Secondary | ICD-10-CM

## 2017-01-23 DIAGNOSIS — J069 Acute upper respiratory infection, unspecified: Secondary | ICD-10-CM | POA: Diagnosis not present

## 2017-01-23 DIAGNOSIS — I10 Essential (primary) hypertension: Secondary | ICD-10-CM

## 2017-01-23 DIAGNOSIS — E876 Hypokalemia: Secondary | ICD-10-CM

## 2017-01-23 DIAGNOSIS — K2970 Gastritis, unspecified, without bleeding: Secondary | ICD-10-CM | POA: Diagnosis not present

## 2017-01-23 LAB — CBC
HCT: 35.5 % — ABNORMAL LOW (ref 36.0–46.0)
HEMOGLOBIN: 11.4 g/dL — AB (ref 12.0–15.0)
MCHC: 32.1 g/dL (ref 30.0–36.0)
MCV: 83.6 fl (ref 78.0–100.0)
PLATELETS: 272 10*3/uL (ref 150.0–400.0)
RBC: 4.25 Mil/uL (ref 3.87–5.11)
RDW: 16.5 % — ABNORMAL HIGH (ref 11.5–15.5)
WBC: 9 10*3/uL (ref 4.0–10.5)

## 2017-01-23 LAB — BASIC METABOLIC PANEL
BUN: 8 mg/dL (ref 6–23)
CHLORIDE: 100 meq/L (ref 96–112)
CO2: 31 meq/L (ref 19–32)
CREATININE: 0.91 mg/dL (ref 0.40–1.20)
Calcium: 9.3 mg/dL (ref 8.4–10.5)
GFR: 65.18 mL/min (ref >60.00)
Glucose, Bld: 95 mg/dL (ref 70–99)
Potassium: 2.8 mEq/L — CL (ref 3.5–5.1)
Sodium: 139 mEq/L (ref 135–145)

## 2017-01-23 MED ORDER — POTASSIUM CHLORIDE ER 10 MEQ PO TBCR: EXTENDED_RELEASE_TABLET | ORAL | 0 refills | Status: DC

## 2017-01-23 NOTE — Telephone Encounter (Signed)
Spoke with patient regarding low potassium. She was previously low in setting of low oral intake and diuretic use. Oral intake has improved to some degree and she reported she discontinued the diuretic during her recent hospitalization. She previously had difficulty taking in 20 meq potassium tablets. She notes she will pick up the potassium supplement tomorrow as she does not drive after dark. I advised to start supplement and we would plan on checking on Friday to determine if she needs more supplementation through the weekend. Discussed reasons to seek medical care. Potassium ordered. Will have Jessica set the patient up for labs on Friday afternoon prior to 3 pm.

## 2017-01-23 NOTE — Patient Instructions (Signed)
Nice to see you. I am glad you are doing better. Please continue your Protonix. Please follow-up with GI as scheduled. Please avoid alcohol intake and Tylenol intake. Please continue to monitor your blood pressure and if this worsens please let us know. Please monitor your upper respiratory symptoms and if they do not improve over the next several days or they significantly worsen or you develop fevers, trouble swallowing, or any new or changing symptoms please be reevaluated.

## 2017-01-23 NOTE — Assessment & Plan Note (Signed)
Suspect upper respiratory symptoms likely related to viral illness.  She does have a small ulcer on her soft palate that appears to be an aphthous ulcer.  We will plan on rechecking this in 2 weeks and if not improving consider referral to ENT for possible biopsy.  She will monitor her upper respiratory symptoms and if not improving she will let us know.  Given return precautions.

## 2017-01-23 NOTE — Assessment & Plan Note (Signed)
Patient with gastritis and duodenitis found on EGD.  Currently on PPI.  Swallowing symptoms have improved significantly with dilatation of prior stricture.  She will continue her current regimen and follow-up with GI in 6 months for EGD.  If swallowing issues return she will contact us or GI.

## 2017-01-23 NOTE — Telephone Encounter (Signed)
Received call about critical low K 2.8, not on diuretic  I sent rx for kdur 10 - 2 per day for 3 days only  I suggested to call nurse to let pt be aware to recheck the K on Monday  I will forward to PCP, as I am not able to order the lab at the PCP's lab

## 2017-01-23 NOTE — Addendum Note (Signed)
Addended by: Leone Haven on: 01/23/2017 06:25 PM   Modules accepted: Orders

## 2017-01-23 NOTE — Progress Notes (Signed)
Anne Rumps, MD Phone: [phone]  Anne [name] is a 66 y.o. female who presents today for follow-up.  Patient follows up for hospital admission from 01/03/17-12/09/16 for dehydration, gastritis, and hypokalemia.  Patient was treated with IV fluids with potassium.  Maxide was discontinued.  Potassium improved to near normal by discharge.  Additionally noted to have gastritis and duodenitis.  She was started on Protonix and continues to take this.  She followed up with GI last week for this and for cirrhosis seen on CT scan.  Lab work was done through them.  Her blood pressure has remained well controlled since discontinuing her blood pressure medications while in the hospital.  She does note some intermittent depression and continues on Paxil.  Notes no SI.  She was noted to be anemic as well.  She is due for recheck.  Medications reviewed.  She has not been taking meloxicam or Naprosyn.  Discharge summary reviewed.  She does note she feels as though she might be getting a viral illness.  She notes some nasal congestion as well as postnasal drip and a slight discomfort in her throat.  This started in the last couple of days.  No fevers.  Social History   Tobacco Use  Smoking Status Former Smoker  . Last attempt to quit: 02/07/2012  . Years since quitting: 4.9  Smokeless Tobacco Never Used     ROS see history of present illness  Objective  Physical Exam Vitals:   01/23/17 1430  BP: 128/72  Pulse: 83  Temp: 97.8 F (36.6 C)  SpO2: 98%    BP Readings from Last 3 Encounters:  01/23/17 128/72  01/17/17 112/65  01/08/17 (!) 108/54   Wt Readings from Last 3 Encounters:  01/23/17 171 lb 6.4 oz (77.7 kg)  01/17/17 177 lb (80.3 kg)  01/07/17 175 lb (79.4 kg)    Physical Exam  Constitutional: No distress.  HENT:  Head: Normocephalic and atraumatic.  Mouth/Throat:    Normal TMs  Eyes: Conjunctivae are normal. Pupils are equal, round, and reactive to light.  Neck: Neck  supple.  Cardiovascular: Normal rate, regular rhythm and normal heart sounds.  Pulmonary/Chest: Effort normal and breath sounds normal.  Abdominal: Soft. Bowel sounds are normal. She exhibits no distension. There is no tenderness. There is no rebound and no guarding.  Musculoskeletal: She exhibits no edema.  Lymphadenopathy:    She has no cervical adenopathy.  Neurological: She is alert. Gait normal.  Skin: Skin is warm and dry. She is not diaphoretic.     Assessment/Plan: Please see individual problem list.  Benign essential HTN Well-controlled off medication.  She will continue to monitor at home and if it trends up she will let us know.  Anemia Noted on labs from the hospital.  We will recheck today.  Iron studies revealed an elevated ferritin.  Suspect related to chronic disease.  Depression Stable.  Continue Paxil.  I offered referral to therapist though she declined.  Hypokalemia Improved with improved oral intake as well as stopping Maxide.  We will recheck today.  Cirrhosis of liver ([place]) She is following with GI for this.  Recent lab work completed.  She is to follow-up with them for repeat imaging in 6 months.  Advised to avoid alcohol and Tylenol.  Gastritis Patient with gastritis and duodenitis found on EGD.  Currently on PPI.  Swallowing symptoms have improved significantly with dilatation of prior stricture.  She will continue her current regimen and follow-up with GI in  6 months for EGD.  If swallowing issues return she will contact us or GI.  Upper respiratory infection Suspect upper respiratory symptoms likely related to viral illness.  She does have a small ulcer on her soft palate that appears to be an aphthous ulcer.  We will plan on rechecking this in 2 weeks and if not improving consider referral to ENT for possible biopsy.  She will monitor her upper respiratory symptoms and if not improving she will let us know.  Given return precautions.   Anne [name] was seen  today for hospitalization follow-up.  Diagnoses and all orders for this visit:  Hypokalemia -     Basic Metabolic Panel (BMET)  Anemia, unspecified type -     CBC  Benign essential HTN  Recurrent major depressive disorder, in partial remission ([place])  Cirrhosis of liver without ascites, unspecified hepatic cirrhosis type ([place])  Gastritis without bleeding, unspecified chronicity, unspecified gastritis type  Viral upper respiratory tract infection    Orders Placed This Encounter  Procedures  . Basic Metabolic Panel (BMET)  . CBC    No orders of the defined types were placed in this encounter.    Anne Rumps, MD [place]

## 2017-01-23 NOTE — Assessment & Plan Note (Signed)
Improved with improved oral intake as well as stopping Maxide.  We will recheck today.

## 2017-01-23 NOTE — Assessment & Plan Note (Signed)
Well-controlled off medication.  She will continue to monitor at home and if it trends up she will let us know.

## 2017-01-23 NOTE — Assessment & Plan Note (Signed)
She is following with GI for this.  Recent lab work completed.  She is to follow-up with them for repeat imaging in 6 months.  Advised to avoid alcohol and Tylenol.

## 2017-01-23 NOTE — Assessment & Plan Note (Signed)
Noted on labs from the hospital.  We will recheck today.  Iron studies revealed an elevated ferritin.  Suspect related to chronic disease.

## 2017-01-23 NOTE — Assessment & Plan Note (Signed)
Stable.  Continue Paxil.  I offered referral to therapist though she declined.

## 2017-01-24 ENCOUNTER — Ambulatory Visit: Payer: PPO | Admitting: Family Medicine

## 2017-01-24 NOTE — Telephone Encounter (Signed)
Patient is scheduled   

## 2017-01-24 NOTE — Telephone Encounter (Signed)
Left message to return call to schedule lab appointment for Friday dec 21st before 3pm,ok for pec to schedule

## 2017-01-25 ENCOUNTER — Other Ambulatory Visit (INDEPENDENT_AMBULATORY_CARE_PROVIDER_SITE_OTHER): Payer: PPO

## 2017-01-25 ENCOUNTER — Other Ambulatory Visit: Payer: Self-pay | Admitting: Family Medicine

## 2017-01-25 DIAGNOSIS — E876 Hypokalemia: Secondary | ICD-10-CM

## 2017-01-25 LAB — POTASSIUM: POTASSIUM: 3.1 meq/L — AB (ref 3.5–5.1)

## 2017-01-25 MED ORDER — POTASSIUM CHLORIDE CRYS ER 20 MEQ PO TBCR
40.0000 meq | EXTENDED_RELEASE_TABLET | Freq: Every day | ORAL | 0 refills | Status: DC
Start: 2017-01-25 — End: 2017-02-12

## 2017-01-28 ENCOUNTER — Telehealth: Payer: Self-pay | Admitting: Radiology

## 2017-01-28 DIAGNOSIS — E876 Hypokalemia: Secondary | ICD-10-CM

## 2017-01-28 NOTE — Telephone Encounter (Signed)
PT coming in for labs Wednesday, please place future orders. Thank you.

## 2017-01-28 NOTE — Addendum Note (Signed)
Addended by: Caryl Bis, Shawntavia Saunders G on: 01/28/2017 10:03 AM   Modules accepted: Orders

## 2017-01-30 ENCOUNTER — Telehealth: Payer: Self-pay

## 2017-01-30 ENCOUNTER — Other Ambulatory Visit (INDEPENDENT_AMBULATORY_CARE_PROVIDER_SITE_OTHER): Payer: PPO

## 2017-01-30 ENCOUNTER — Other Ambulatory Visit: Payer: Self-pay | Admitting: Family Medicine

## 2017-01-30 DIAGNOSIS — E876 Hypokalemia: Secondary | ICD-10-CM

## 2017-01-30 LAB — POTASSIUM: POTASSIUM: 3.8 meq/L (ref 3.5–5.1)

## 2017-01-30 NOTE — Telephone Encounter (Signed)
Pt has been informed labs are normal except mildly elevated IGM and IGE- suggest repeating in 4 months.   Thanks [employer]

## 2017-01-30 NOTE — Telephone Encounter (Signed)
Pt has been informed she had gastroduodenitis and esophagitis.  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  Has been asked to follow up with Dr. Bonna Gains.  Thanks [employer]

## 2017-01-30 NOTE — Telephone Encounter (Signed)
-----   Message from Jonathon Bellows, MD sent at 01/29/2017  5:12 PM EST ----- Sharyn Lull inform patient had gastroduodenitis and esophagitis. 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

## 2017-01-30 NOTE — Telephone Encounter (Signed)
-----   Message from Jonathon Bellows, MD sent at 01/29/2017  5:14 PM EST ----- Inform labs are normal except mildly elevated IGM and IGE- suggest repeating in 4 months   C/c Caryl Bis, Angela Adam, MD

## 2017-01-31 ENCOUNTER — Telehealth: Payer: Self-pay | Admitting: Gastroenterology

## 2017-01-31 NOTE — Telephone Encounter (Signed)
Left voice message for patient to call and schedule a 2 week follow up with Dr. Bonna Gains

## 2017-02-07 ENCOUNTER — Ambulatory Visit: Payer: PPO | Admitting: Family Medicine

## 2017-02-08 ENCOUNTER — Ambulatory Visit: Payer: Self-pay | Admitting: *Deleted

## 2017-02-08 ENCOUNTER — Telehealth: Payer: Self-pay | Admitting: Family Medicine

## 2017-02-08 MED ORDER — PANTOPRAZOLE SODIUM 40 MG PO TBEC: 40.0000 mg | DELAYED_RELEASE_TABLET | Freq: Two times a day (BID) | ORAL | 2 refills | Status: DC

## 2017-02-08 NOTE — Telephone Encounter (Signed)
Pt states medication was last filled when she was in the hospital. When the pt was asked who filled the medication previously she states she does not know. Pt states she does go back to gastroenterologist until 6 months. Pt asking if Dr. Caryl Bis could fill Protonix.

## 2017-02-08 NOTE — Telephone Encounter (Signed)
Please advise 

## 2017-02-08 NOTE — Telephone Encounter (Signed)
Copied from [place] [id number]. Topic: Quick Communication - See Telephone Encounter >> Feb 08, 2017  3:44 PM Cleaster Corin, NT wrote: CRM for notification. See Telephone encounter for:   02/08/17. Pt calling to get refill on med. Protonix pt. Can be reached at [facility] [address], [place] [address] [place] [address] Phone: [phone] Fax: [phone]

## 2017-02-08 NOTE — Telephone Encounter (Signed)
Pt called with complaints vomiting for several days; she states that she was recently hospitalized for this; nurse triage initiated and recommendation made for pt to [facility] ED but she refuses; she would like to be seen by a physician in the office; conference call iinitiated with Larena Glassman, and pt encouraged to go to urgent care [facility]) for evaluation; pt verbalizes understanding and will proceed there.   Reason for Disposition . [1] SEVERE vomiting (e.g., 6 or more times/day) AND [2] present > 8 hours  Answer Assessment - Initial Assessment Questions 1. VOMITING SEVERITY: "How many times have you vomited in the past 24 hours?"     - MILD:  1 - 2 times/day    - MODERATE: 3 - 5 times/day, decreased oral intake without significant weight loss or symptoms of dehydration    - SEVERE: 6 or more times/day, vomits everything or nearly everything, with significant weight loss, symptoms of dehydration      severe 2. ONSET: "When did the vomiting begin?"      02/01/17 3. FLUIDS: "What fluids or food have you vomited up today?" "Have you been able to keep any fluids down?"     No episodes today but can not keep coke down  4. ABDOMINAL PAIN: "Are your having any abdominal pain?" If yes : "How bad is it and what does it feel like?" (e.g., crampy, dull, intermittent, constant)      No; pain only when throwing up 5. DIARRHEA: "Is there any diarrhea?" If so, ask: "How many times today?"      no 6. CONTACTS: "Is there anyone else in the family with the same symptoms?"      no 7. CAUSE: "What do you think is causing your vomiting?"     unsure 8. HYDRATION STATUS: "Any signs of dehydration?" (e.g., dry mouth [not only dry lips], too weak to stand) "When did you last urinate?"     Loosing weight, dry lips and mouth; urinated only a small amount yesterday 9. OTHER SYMPTOMS: "Do you have any other symptoms?" (e.g., fever, headache, vertigo, vomiting blood or coffee grounds, recent head injury)     no 10.  PREGNANCY: "Is there any chance you are pregnant?" "When was your last menstrual period?"       no  Protocols used: [facility]

## 2017-02-08 NOTE — Telephone Encounter (Signed)
Sent to pharmacy 

## 2017-02-12 ENCOUNTER — Ambulatory Visit (INDEPENDENT_AMBULATORY_CARE_PROVIDER_SITE_OTHER): Payer: PPO

## 2017-02-12 ENCOUNTER — Ambulatory Visit (INDEPENDENT_AMBULATORY_CARE_PROVIDER_SITE_OTHER): Payer: PPO | Admitting: Family Medicine

## 2017-02-12 ENCOUNTER — Encounter: Payer: Self-pay | Admitting: Family Medicine

## 2017-02-12 ENCOUNTER — Telehealth: Payer: Self-pay | Admitting: Family Medicine

## 2017-02-12 ENCOUNTER — Other Ambulatory Visit: Payer: Self-pay

## 2017-02-12 VITALS — BP 152/90 | HR 79 | Temp 97.6°F | Wt 161.2 lb

## 2017-02-12 DIAGNOSIS — K2970 Gastritis, unspecified, without bleeding: Secondary | ICD-10-CM | POA: Diagnosis not present

## 2017-02-12 DIAGNOSIS — K7460 Unspecified cirrhosis of liver: Secondary | ICD-10-CM

## 2017-02-12 DIAGNOSIS — F3341 Major depressive disorder, recurrent, in partial remission: Secondary | ICD-10-CM | POA: Diagnosis not present

## 2017-02-12 DIAGNOSIS — R634 Abnormal weight loss: Secondary | ICD-10-CM

## 2017-02-12 DIAGNOSIS — R911 Solitary pulmonary nodule: Secondary | ICD-10-CM

## 2017-02-12 DIAGNOSIS — K469 Unspecified abdominal hernia without obstruction or gangrene: Secondary | ICD-10-CM

## 2017-02-12 DIAGNOSIS — E876 Hypokalemia: Secondary | ICD-10-CM

## 2017-02-12 DIAGNOSIS — I10 Essential (primary) hypertension: Secondary | ICD-10-CM

## 2017-02-12 DIAGNOSIS — IMO0001 Reserved for inherently not codable concepts without codable children: Secondary | ICD-10-CM

## 2017-02-12 DIAGNOSIS — K5900 Constipation, unspecified: Secondary | ICD-10-CM

## 2017-02-12 DIAGNOSIS — R918 Other nonspecific abnormal finding of lung field: Secondary | ICD-10-CM | POA: Diagnosis not present

## 2017-02-12 LAB — BASIC METABOLIC PANEL
BUN: 12 mg/dL (ref 6–23)
CO2: 30 mEq/L (ref 19–32)
Calcium: 9.6 mg/dL (ref 8.4–10.5)
Chloride: 99 mEq/L (ref 96–112)
Creatinine, Ser: 0.82 mg/dL (ref 0.40–1.20)
GFR: 73.49 mL/min (ref >60.00)
Glucose, Bld: 95 mg/dL (ref 70–99)
POTASSIUM: 2.7 meq/L — AB (ref 3.5–5.1)
SODIUM: 138 meq/L (ref 135–145)

## 2017-02-12 MED ORDER — POTASSIUM CHLORIDE ER 10 MEQ PO TBCR: 10.0000 meq | EXTENDED_RELEASE_TABLET | Freq: Every day | ORAL | 1 refills | Status: DC

## 2017-02-12 NOTE — Telephone Encounter (Signed)
SPoke with the patient regarding lab results. K is low again.  A prescription for potassium was sent in by the will plan on rechecking on Friday.  Suspect patient will need daily potassium supplementation until she is eating better.  I will have the front office staff contact the patient to set up a lab appointment for Friday.

## 2017-02-12 NOTE — Progress Notes (Signed)
Tommi Rumps, MD Phone: [phone]  Anne [name] is a 67 y.o. female who presents today for f/u.  Patient continues to have issues with weight loss.  She notes anytime she swallows she vomits food up pretty quickly though it can occur up to 2 hours later.  She does not have much of an appetite.  No blood in her stool.  Notes every 2-3 days she will have a runny bowel movement.  Last was 3 days ago.  She notes no abdominal pain though does note her abdomen feels "weird."  No significant nausea.  She underwent an extensive evaluation with EGD revealing findings consistent with short segment Barrett's esophagus as well as gastritis.  She also had stenosis noted in her esophagus.  This was dilated.  She had CT lung, abdomen and pelvis that did reveal a lung nodule as well as cirrhosis.  She is following with GI for her cirrhosis.  There was also a hernia in the right upper quadrant which did contain a loop of transverse bowel though she has not noticed any bulges or discomfort in that area.  She has been off of blood pressure medication since discharge from the hospital.  She has not consistently been checking it at home.  She does feel somewhat more depressed than usual though no SI.  Notes most of her depression is surrounding the issues that she has currently.  She continues on Paxil.  Social History   Tobacco Use  Smoking Status Former Smoker  . Last attempt to quit: 02/07/2012  . Years since quitting: 5.0  Smokeless Tobacco Never Used     ROS see history of present illness  Objective  Physical Exam Vitals:   02/12/17 1315  BP: (!) 152/90  Pulse: 79  Temp: 97.6 F (36.4 C)  SpO2: 97%    BP Readings from Last 3 Encounters:  02/14/17 138/87  02/12/17 (!) 152/90  01/23/17 128/72   Wt Readings from Last 3 Encounters:  02/14/17 159 lb 12.8 oz (72.5 kg)  02/12/17 161 lb 3.2 oz (73.1 kg)  01/23/17 171 lb 6.4 oz (77.7 kg)    Physical Exam  Constitutional: No distress.    Cardiovascular: Normal rate, regular rhythm and normal heart sounds.  Pulmonary/Chest: Effort normal and breath sounds normal.  Abdominal: Soft. Bowel sounds are normal. She exhibits no distension. There is no tenderness. There is no rebound and no guarding.  No palpable hernia  Musculoskeletal: She exhibits no edema.  Neurological: She is alert. Gait normal.  Skin: Skin is warm and dry. She is not diaphoretic.     Assessment/Plan: Please see individual problem list.  Benign essential HTN Elevated today though had been well controlled off of blood pressure medication.  She will monitor it for the next several days and call us on Friday with her numbers.  If still elevated consider adding blood pressure medication.  Cirrhosis of liver ([place]) She will follow with GI.  Gastritis Swallowing improved though continues to have issues with vomiting and nausea.  She has follow-up with GI this week.  She will continue her PPI.  Given decreased food intake will check potassium again and renal function.  Check abdominal x-ray to evaluate bowel gas pattern and for constipation given infrequent bowel movements.  She is given return precautions.  Depression Continued issue.  Suspect worse related to her current health situation.  Offered additional treatment though deferred at this time.  Weight loss Continued weight loss.  Suspect related to GI issues given  poor p.o. intake.  She will follow-up with GI later this week.  Continue to monitor weight.  Hypokalemia Recheck.  Abdominal hernia without obstruction and without gangrene No obvious signs of a hernia on exam.  Benign abdominal exam.  Will refer to general surgery for outpatient follow-up of this.  Lung nodule < 6cm on CT CT scan ordered for follow-up in December 2019.    Orders Placed This Encounter  Procedures  . DG Abd 1 View    Standing Status:   Future    Number of Occurrences:   1    Standing Expiration Date:   04/13/2018     Order Specific Question:   Reason for Exam (SYMPTOM  OR DIAGNOSIS REQUIRED)    Answer:   constipation, intermittent BMs, no abdominal pain, intermittent vomiting    Order Specific Question:   Preferred imaging location?    Answer:   [facility] Specific Question:   Radiology Contrast Protocol - do NOT remove file path    Answer:   [email or web address]  . CT CHEST NODULE FOLLOW UP LOW DOSE W/O    Standing Status:   Future    Standing Expiration Date:   04/16/2018    Scheduling Instructions:     To be scheduled in early December 2019    Order Specific Question:   Preferred imaging location?    Answer:   [facility]    Order Specific Question:   Radiology Contrast Protocol - do NOT remove file path    Answer:   [email or web address].pdf  . Basic Metabolic Panel (BMET)  . Ambulatory referral to General Surgery    Referral Priority:   Routine    Referral Type:   Surgical    Referral Reason:   Specialty Services Required    Requested Specialty:   General Surgery    Number of Visits Requested:   1    No orders of the defined types were placed in this encounter.    Tommi Rumps, MD [place]

## 2017-02-12 NOTE — Assessment & Plan Note (Addendum)
Elevated today though had been well controlled off of blood pressure medication.  She will monitor it for the next several days and call us on Friday with her numbers.  If still elevated consider adding blood pressure medication.

## 2017-02-12 NOTE — Patient Instructions (Signed)
Nice to see you. We will contact you with the x-ray results.  Please see GI as planned.  If you develop abdominal pain, worsening vomiting, lack of BMs, or any new symptoms please seek medical attention.

## 2017-02-13 ENCOUNTER — Other Ambulatory Visit: Payer: PPO

## 2017-02-13 ENCOUNTER — Encounter: Payer: Self-pay | Admitting: *Deleted

## 2017-02-13 NOTE — Telephone Encounter (Signed)
Ok. It's done.

## 2017-02-14 ENCOUNTER — Ambulatory Visit (INDEPENDENT_AMBULATORY_CARE_PROVIDER_SITE_OTHER): Payer: PPO | Admitting: Gastroenterology

## 2017-02-14 ENCOUNTER — Encounter: Payer: Self-pay | Admitting: Gastroenterology

## 2017-02-14 VITALS — BP 138/87 | HR 84 | Temp 97.6°F | Ht 65.0 in | Wt 159.8 lb

## 2017-02-14 DIAGNOSIS — K7469 Other cirrhosis of liver: Secondary | ICD-10-CM | POA: Diagnosis not present

## 2017-02-14 DIAGNOSIS — R1310 Dysphagia, unspecified: Secondary | ICD-10-CM

## 2017-02-14 DIAGNOSIS — R1319 Other dysphagia: Secondary | ICD-10-CM

## 2017-02-14 NOTE — Progress Notes (Signed)
Vonda Antigua, MD [address]  [place]  [place], [place] [address]  Main: [phone]  Fax: [phone]   Primary Care Physician: Leone Haven, MD  Primary Gastroenterologist:  Dr. Vonda Antigua  Chief Complaint  Patient presents with  . Follow-up    GERD, Weight loss (down 40lbs in 4 months)    HPI: Anne [name] is a 67 y.o. female here for follow-up of dysphagia and weight loss.  Patient reports that since her last EGD, her dysphagia has resolved.  She ate a chicken sandwich yesterday without problems.  2 days prior to that she had a home-cooked meal with chicken and peas and corn without any trouble.  She states, at this time her main problem is fear of eating as she does not want to feel the dysphagia again.  She has had nausea vomiting when her symptoms first started, however does not have any at this time.  Reports 2-3 loose bowel movements a day without blood.  Denies abdominal pain.  Patient was initially seen for dysphagia and weight loss starting December 24, 2016.  She has not had a documented 40 pound weight loss over the last year.  She has had a colonoscopy in October 2018 by Dr. Vicente Males that showed 3 polyps, less than 10 mm, 2 of them showed adenoma, repeat recommended in 5 years.  She underwent EGD on January 07, 2017 by Dr. Vicente Males.  Inflammation was seen in the duodenal bulb and gastric antrum.  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  Hiatal hernia was noted.  Grade B esophagitis in the distal esophagus.  Biopsies of the duodenum showed mild chronic active duodenitis.  Stomach biopsies showed moderate chronic gastritis without H. pylori.  GE junction biopsies showed chronic active gastritis, focal intestinal metaplasia was reported, however squamous mucosa was not present.  Random esophageal biopsies did not show any eosinophils, fungus or yeast.  Nonspecific esophagitis was reported. CT chest abdomen pelvis was done on November  28 due to her weight loss.  It showed large hernia in the right upper quadrant containing loop of transverse colon without incarceration or obstruction.  5 mm nodule noted in the superior segment of left lower lobe of the lung.  Unremarkable pancreas.  Status post cholecystectomy.  Nodular hepatic margins concerning for hepatic cirrhosis.  No focal abnormality.    Normal platelets and INR. She underwent workup for the cirrhosis with viral hepatitis serology and autoimmune workup which is all been negative.  Hepatitis A total antibody positive showing immunity.  Hep B surface antibody is positive at 40 showing immunity.  Hep B core antibody is negative.  Hep B E antigen and E antibody were negative as well.  Hep C antibody negative.  Alpha-1 antitrypsin, antimitochondrial, anti-smooth muscle, anti-LK M antibody is negative.  HIV negative.  Ceruloplasmin normal.  Immunoglobulins show elevated IgM and.  IgG and IgA normal.  Ferritin mildly elevated to 417.  Iron level 55.  Patient denies any previous history of heavy alcohol use.  States most she has ever drank is 1 drink every 6 months.  Current Outpatient Medications  Medication Sig Dispense Refill  . DHA-Vitamin C-Lutein (EYE HEALTH FORMULA PO) Take by mouth.    . Fish Oil-Cholecalciferol (FISH OIL + D3) 1000-1000 MG-UNIT CAPS Take by mouth.    . folic acid (FOLVITE) 1 MG tablet Take 1 tablet (1 mg total) by mouth daily. 30 tablet 0  . ondansetron (ZOFRAN) 4 MG  tablet Take 1 tablet (4 mg total) by mouth every 8 (eight) hours as needed for nausea or vomiting. 30 tablet 1  . oxybutynin (DITROPAN-XL) 5 MG 24 hr tablet TAKE 1 TABLET BY MOUTH AT BEDTIME 90 tablet 1  . pantoprazole (PROTONIX) 40 MG tablet Take 1 tablet (40 mg total) by mouth 2 (two) times daily before a meal. 60 tablet 2  . PARoxetine (PAXIL) 20 MG tablet TAKE ONE TABLET BY MOUTH ONCE DAILY 90 tablet 2  . potassium chloride (K-DUR) 10 MEQ tablet Take 1 tablet (10 mEq total) by mouth daily.  Fill on 02/15/17. 30 tablet 1  . rosuvastatin (CRESTOR) 20 MG tablet Take 1 tablet (20 mg total) by mouth daily. 90 tablet 3  . St Johns Wort 150 MG CAPS Take 1 capsule by mouth daily.     No current facility-administered medications for this visit.     Allergies as of 02/14/2017  . (No Known Allergies)    ROS:  General: Negative for anorexia, weight loss, fever, chills, fatigue, weakness. ENT: Negative for hoarseness, difficulty swallowing , nasal congestion. CV: Negative for chest pain, angina, palpitations, dyspnea on exertion, peripheral edema.  Respiratory: Negative for dyspnea at rest, dyspnea on exertion, cough, sputum, wheezing.  GI: See history of present illness. GU:  Negative for dysuria, hematuria, urinary incontinence, urinary frequency, nocturnal urination.  Endo: Negative for unusual weight change.    Physical Examination:   BP 138/87   Pulse 84   Temp 97.6 F (36.4 C)   Ht 5\' 5"  (1.651 m)   Wt 159 lb 12.8 oz (72.5 kg)   BMI 26.59 kg/m   General: Well-nourished, well-developed in no acute distress.  Eyes: No icterus. Conjunctivae pink. Mouth: Oropharyngeal mucosa moist and pink , no lesions erythema or exudate. Lungs: Clear to auscultation bilaterally. Non-labored. Heart: Regular rate and rhythm, no murmurs rubs or gallops.  Abdomen: Bowel sounds are normal, nontender, nondistended, no hepatosplenomegaly or masses, no abdominal bruits or hernia , no rebound or guarding.   Extremities: No lower extremity edema. No clubbing or deformities. Neuro: Alert and oriented x 3.  Grossly intact. Skin: Warm and dry, no jaundice.   Psych: Alert and cooperative, normal mood and affect.   Labs: CMP     Component Value Date/Time   NA 138 02/12/2017 1345   K 2.7 (LL) 02/12/2017 1345   CL 99 02/12/2017 1345   CO2 30 02/12/2017 1345   GLUCOSE 95 02/12/2017 1345   BUN 12 02/12/2017 1345   CREATININE 0.82 02/12/2017 1345   CREATININE 1.09 02/26/2012 0925   CALCIUM 9.6  02/12/2017 1345   PROT 8.0 01/03/2017 1242   ALBUMIN 3.9 01/03/2017 1242   AST 25 01/03/2017 1242   ALT 15 01/03/2017 1242   ALKPHOS 81 01/03/2017 1242   BILITOT 1.2 01/03/2017 1242   GFRNONAA >60 01/08/2017 0516   GFRNONAA 54 (L) 02/26/2012 0925   GFRAA >60 01/08/2017 0516   GFRAA >60 02/26/2012 0925   Lab Results  Component Value Date   WBC 9.0 01/23/2017   HGB 11.4 (L) 01/23/2017   HCT 35.5 (L) 01/23/2017   MCV 83.6 01/23/2017   PLT 272.0 01/23/2017    Imaging Studies: Dg Abd 1 View  Result Date: 02/13/2017 CLINICAL DATA:  Constipation. EXAM: ABDOMEN - 1 VIEW COMPARISON:  CT of the abdomen and pelvis 01/03/2015. FINDINGS: The bowel gas pattern is normal. No radio-opaque calculi or other significant radiographic abnormality are seen in the abdomen. Moderate degenerative changes are again  noted in the lower lumbar spine. Cholecystectomy is noted. IMPRESSION: 1. Normal bowel gas pattern. 2. Moderate degenerative changes in the lower lumbar spine. Electronically Signed   By: San Morelle M.D.   On: 02/13/2017 07:41    Assessment and Plan:   BREELYNN BANKERT is a 67 y.o. y/o female with dysphagia and weight loss  Dysphagia Resolved.  Patient ate a chicken sandwich yesterday without any problems.  Prior to this she ate a whole meal of chicken and peas and corn without any problems.  Reports fear of eating however it is getting better as she is able to tolerate solid foods at this time. 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 Biopsies at the GE junction and random esophageal biopsies did not show any eosinophils or malignancy.  Grade B esophagitis was noted.   We will decrease Protonix to once a day as patient denies any heartburn or dysphagia, and goal is to have on the lowest dose possible of PPIs to prevent adverse events. Patient has to take this 30 minutes before breakfast as was recommended per Dr. Georgeann Oppenheim last  note. Patient asked to maintain a dysphagia diet and if symptoms remain resolved, patient can advance diet as tolerated If patient has any difficulty swallowing food, she was asked to contact us right away as repeat EGD may be needed for repeat dilation.  She verbalizes understanding. If she develops heartburn with the decrease in dose.  She was asked to call us as well.  Possible Barrett's Focal intestinal metaplasia was seen however it is unclear if these biopsies were obtained from the esophagus or the stomach, as squamous mucosa was not present on the biopsies. We will plan on repeat EGD in 6 months for further biopsies of the area as recommended and Dr. Georgeann Oppenheim last note We will continue Protonix for now (Risks of PPI use were discussed with patient including bone loss, C. Diff diarrhea, pneumonia, infections, CKD, electrolyte abnormalities. Pt. Verbalizes understanding and chooses to continue the medication. )  Electrolyte abnormalities Hypokalemia being monitored by primary care physician who is replacing this As she improves her diet but this is expected to improve as well I have encouraged her to eat a better diet and multiple small meals a day.  I have asked her to chop her food well, followed by water liquids as she is continue to eat better.  As this continues to improve she can advance her diet as tolerated as well.  Cirrhosis Reported on CT scan but no clinical evidence of such with normal platelets and INR.  Patient has undergone cirrhosis workup as listed in HPI including autoimmune and viral hepatitis testing Etiology of cirrhosis possibly Karlene Lineman However, in the absence of any clinical evidence of cirrhosis, we can obtain fibrosure with next labs We will repeat CMP for now along with AFP Repeat ultrasound every 6 months for hepatocellular carcinoma surveillance She has to avoid alcohol and hepatotoxic drugs  Loose stools Reports 2-3 loose stools during the day.  No bowel  movements at night Asked to start taking Metamucil to help form more bulk her stool We will hold Metamucil if it causes more loose stools or diarrhea. Colonoscopy up-to-date as listed in HPI.  Next one due in 5 years.  Dr Vonda Antigua

## 2017-02-15 ENCOUNTER — Telehealth: Payer: Self-pay | Admitting: Family Medicine

## 2017-02-15 ENCOUNTER — Other Ambulatory Visit (INDEPENDENT_AMBULATORY_CARE_PROVIDER_SITE_OTHER): Payer: PPO

## 2017-02-15 DIAGNOSIS — K439 Ventral hernia without obstruction or gangrene: Secondary | ICD-10-CM | POA: Insufficient documentation

## 2017-02-15 DIAGNOSIS — K7469 Other cirrhosis of liver: Secondary | ICD-10-CM | POA: Diagnosis not present

## 2017-02-15 DIAGNOSIS — E876 Hypokalemia: Secondary | ICD-10-CM

## 2017-02-15 DIAGNOSIS — R911 Solitary pulmonary nodule: Secondary | ICD-10-CM | POA: Insufficient documentation

## 2017-02-15 DIAGNOSIS — IMO0001 Reserved for inherently not codable concepts without codable children: Secondary | ICD-10-CM | POA: Insufficient documentation

## 2017-02-15 LAB — HEPATIC FUNCTION PANEL
ALT: 7 U/L (ref 0–35)
AST: 13 U/L (ref 0–37)
Albumin: 4.1 g/dL (ref 3.5–5.2)
Alkaline Phosphatase: 89 U/L (ref 39–117)
BILIRUBIN DIRECT: 0.2 mg/dL (ref 0.0–0.3)
BILIRUBIN TOTAL: 0.9 mg/dL (ref 0.2–1.2)
Total Protein: 7.8 g/dL (ref 6.0–8.3)

## 2017-02-15 LAB — POTASSIUM: Potassium: 2.8 mEq/L — CL (ref 3.5–5.1)

## 2017-02-15 MED ORDER — POTASSIUM CHLORIDE ER 10 MEQ PO TBCR: 20.0000 meq | EXTENDED_RELEASE_TABLET | Freq: Every day | ORAL | 1 refills | Status: DC

## 2017-02-15 NOTE — Assessment & Plan Note (Signed)
No obvious signs of a hernia on exam.  Benign abdominal exam.  Will refer to general surgery for outpatient follow-up of this.

## 2017-02-15 NOTE — Addendum Note (Signed)
Addended by: Leeanne Rio on: 02/15/2017 02:17 PM   Modules accepted: Orders

## 2017-02-15 NOTE — Assessment & Plan Note (Signed)
Continued issue.  Suspect worse related to her current health situation.  Offered additional treatment though deferred at this time.

## 2017-02-15 NOTE — Assessment & Plan Note (Signed)
Swallowing improved though continues to have issues with vomiting and nausea.  She has follow-up with GI this week.  She will continue her PPI.  Given decreased food intake will check potassium again and renal function.  Check abdominal x-ray to evaluate bowel gas pattern and for constipation given infrequent bowel movements.  She is given return precautions.

## 2017-02-15 NOTE — Assessment & Plan Note (Signed)
CT scan ordered for follow-up in December 2019.

## 2017-02-15 NOTE — Telephone Encounter (Signed)
Called and spoke with the patient regarding her potassium.  Advised that it was low again.  She has been taking supplementation though she has not picked up the supplement that was sent to her pharmacy previously.  We will have her continue daily potassium supplementation through the weekend and recheck next week.  She will take two 10 mEq tablets daily.  We will have somebody contact her to set up a lab appointment next week.

## 2017-02-15 NOTE — Addendum Note (Signed)
Addended by: Leone Haven on: 02/15/2017 06:18 PM   Modules accepted: Orders

## 2017-02-15 NOTE — Telephone Encounter (Signed)
Patient was given precautions with which to go to the emergency room for.

## 2017-02-15 NOTE — Telephone Encounter (Signed)
Received call from team health care regarding critical potassium level of 2.8.  Level is increased from her value earlier this week.  She has been started on potassium supplementation.  Advised that patient continue daily potassium supplementation for the weekend and return early next week to have level rechecked.  Algis Greenhouse. Jerline Pain, MD 02/15/2017 6:02 PM

## 2017-02-15 NOTE — Assessment & Plan Note (Signed)
Recheck.

## 2017-02-15 NOTE — Assessment & Plan Note (Signed)
Continued weight loss.  Suspect related to GI issues given poor p.o. intake.  She will follow-up with GI later this week.  Continue to monitor weight.

## 2017-02-15 NOTE — Assessment & Plan Note (Signed)
She will follow with GI.

## 2017-02-17 ENCOUNTER — Telehealth: Payer: Self-pay | Admitting: Family Medicine

## 2017-02-17 DIAGNOSIS — I10 Essential (primary) hypertension: Secondary | ICD-10-CM

## 2017-02-17 NOTE — Telephone Encounter (Signed)
Patient brought in blood pressures from home.  They are as follows: 153/94, 140/89, 148/88.  These are uncontrolled.  We will need to restart her on medication.  I would suggest a medication such as ramipril. She will need to have labs checked one week after starting this. I can send it to her pharmacy once you speak with her. It is on the $4 list at [place]. Thanks.

## 2017-02-18 MED ORDER — PROMETHAZINE HCL 12.5 MG PO TABS: 12.5000 mg | ORAL_TABLET | Freq: Three times a day (TID) | ORAL | 0 refills | Status: DC | PRN

## 2017-02-18 MED ORDER — RAMIPRIL 2.5 MG PO CAPS
2.5000 mg | ORAL_CAPSULE | Freq: Every day | ORAL | 3 refills | Status: DC
Start: 2017-02-18 — End: 2017-06-04

## 2017-02-18 NOTE — Telephone Encounter (Signed)
See other message

## 2017-02-18 NOTE — Progress Notes (Signed)
Patient notified

## 2017-02-18 NOTE — Telephone Encounter (Signed)
Left message to return call to schedule patient for a lab appointment today or tomorrow and also to notify her we received her blood pressure readings and they are uncontrolled. DR.Sonnenebrg would like to restart her on medication. He recommends ramipril, this is on the $4 list at [place] if she would like [place] to send it there or if we need to send this to a different pharmacy. She will need labs done 1 week after starting the blood pressure medication. [place] for pec to speak to patient and schedule both lab appointments non fasting.

## 2017-02-18 NOTE — Addendum Note (Signed)
Addended by: Leone Haven on: 02/18/2017 09:10 PM   Modules accepted: Orders

## 2017-02-18 NOTE — Telephone Encounter (Signed)
Ramipril sent to pharmacy.  Patient needs to come in on Tuesday for recheck of potassium.  She will need to return in 1 week for recheck of labs after starting the ramipril.  Labs have already been placed.

## 2017-02-18 NOTE — Telephone Encounter (Signed)
Patient notified and scheduled for both labs, patient would like ramipril sent to [place]. Patient states she is still vomiting and would like a refill on promethazine

## 2017-02-19 ENCOUNTER — Other Ambulatory Visit: Payer: PPO

## 2017-02-19 ENCOUNTER — Encounter: Payer: Self-pay | Admitting: General Surgery

## 2017-02-19 ENCOUNTER — Ambulatory Visit (INDEPENDENT_AMBULATORY_CARE_PROVIDER_SITE_OTHER): Payer: PPO | Admitting: General Surgery

## 2017-02-19 VITALS — BP 142/70 | HR 70 | Resp 16 | Ht 65.0 in | Wt 161.0 lb

## 2017-02-19 DIAGNOSIS — R634 Abnormal weight loss: Secondary | ICD-10-CM

## 2017-02-19 DIAGNOSIS — K439 Ventral hernia without obstruction or gangrene: Secondary | ICD-10-CM

## 2017-02-19 NOTE — Patient Instructions (Addendum)
Patient to have a x-ray done. The patient is aware to call back for any questions or concerns.  The patient is scheduled for a Small Bowel Follow Thru at [facility] on 02/22/17 at 11:00 am. She is to arrive by 10:45 am and have nothing to eat or drink after midnight the night prior. The patient is aware of date, time, and instructions.

## 2017-02-19 NOTE — Progress Notes (Signed)
Patient ID: Anne [name], female   DOB: 09/10/1950, 67 y.o.   MRN: [id number]  Chief Complaint  Patient presents with  . Other    HPI Anne [name] is a 67 y.o. female here today for a evaluation of an abdominal hernia. Patient did not noticed this area it showed up on her ct scan done 02/13/2017.She went to the ER for not eating. In the last six months she has lost her appetite.Moves her bowels daily. No pain in her groin area.  She states she can drink anything but when she eats may vomit. She has lost 45 pound in the last four months.   HPI  Past Medical History:  Diagnosis Date  . Arthritis   . Depression   . Hyperlipidemia   . Hypertension     Past Surgical History:  Procedure Laterality Date  . APPENDECTOMY  1971  . CHOLECYSTECTOMY  1971  . COLONOSCOPY WITH PROPOFOL N/A 11/15/2016   Procedure: COLONOSCOPY WITH PROPOFOL;  Surgeon: Jonathon Bellows, MD;  Location: [facility] ENDOSCOPY;  Service: Gastroenterology;  Laterality: N/A;  . ESOPHAGOGASTRODUODENOSCOPY (EGD) WITH PROPOFOL N/A 01/07/2017   Procedure: ESOPHAGOGASTRODUODENOSCOPY (EGD) WITH PROPOFOL;  Surgeon: Jonathon Bellows, MD;  Location: [facility] ENDOSCOPY;  Service: Gastroenterology;  Laterality: N/A;  . TONSILECTOMY, ADENOIDECTOMY, BILATERAL MYRINGOTOMY AND TUBES  1955    Family History  Problem Relation Age of Onset  . Cancer Mother        ovarian  . Hypertension Mother   . Ovarian cancer Mother 46  . Heart disease Father   . Stroke Father   . Ovarian cancer Sister        ? dx cancer had hyst.  . Breast cancer Neg Hx     Social History Social History   Tobacco Use  . Smoking status: Former Smoker    Last attempt to quit: 02/07/2012    Years since quitting: 5.0  . Smokeless tobacco: Never Used  Substance Use Topics  . Alcohol use: No    Alcohol/week: 0.0 oz  . Drug use: No    No Known Allergies  Current Outpatient Medications  Medication Sig Dispense Refill  . DHA-Vitamin C-Lutein (EYE HEALTH FORMULA PO) Take by  mouth.    . Fish Oil-Cholecalciferol (FISH OIL + D3) 1000-1000 MG-UNIT CAPS Take by mouth.    . folic acid (FOLVITE) 1 MG tablet Take 1 tablet (1 mg total) by mouth daily. 30 tablet 0  . ondansetron (ZOFRAN) 4 MG tablet Take 1 tablet (4 mg total) by mouth every 8 (eight) hours as needed for nausea or vomiting. 30 tablet 1  . oxybutynin (DITROPAN-XL) 5 MG 24 hr tablet TAKE 1 TABLET BY MOUTH AT BEDTIME 90 tablet 1  . pantoprazole (PROTONIX) 40 MG tablet Take 1 tablet (40 mg total) by mouth 2 (two) times daily before a meal. 60 tablet 2  . PARoxetine (PAXIL) 20 MG tablet TAKE ONE TABLET BY MOUTH ONCE DAILY 90 tablet 2  . potassium chloride (K-DUR) 10 MEQ tablet Take 2 tablets (20 mEq total) by mouth daily. Fill on 02/15/17. 30 tablet 1  . promethazine (PHENERGAN) 12.5 MG tablet Take 1 tablet (12.5 mg total) by mouth every 8 (eight) hours as needed for nausea or vomiting. 20 tablet 0  . ramipril (ALTACE) 2.5 MG capsule Take 1 capsule (2.5 mg total) by mouth daily. 90 capsule 3  . rosuvastatin (CRESTOR) 20 MG tablet Take 1 tablet (20 mg total) by mouth daily. 90 tablet 3  . [place]  150 MG CAPS Take 1 capsule by mouth daily.     No current facility-administered medications for this visit.     Review of Systems Review of Systems  Constitutional: Positive for unexpected weight change.  Respiratory: Negative.   Cardiovascular: Negative.   Gastrointestinal: Positive for vomiting ( after she eats).    Blood pressure (!) 142/70, pulse 70, resp. rate 16, height 5\' 5"  (1.651 m), weight 161 lb (73 kg).  Physical Exam  Physical Exam  Constitutional: She is oriented to person, place, and time. She appears well-developed and well-nourished.  Eyes: Conjunctivae are normal. No scleral icterus.  Neck: Neck supple.  Cardiovascular: Normal rate and regular rhythm.  Murmur heard.  Systolic murmur is present with a grade of 2/6. Pulmonary/Chest: Effort normal and breath sounds normal.    Abdominal:  Soft. Normal appearance and bowel sounds are normal. There is no tenderness. A hernia is present.  Lymphadenopathy:    She has no cervical adenopathy.  Neurological: She is alert and oriented to person, place, and time.  Skin: Skin is warm and dry.    Data Reviewed CT of the chest, abdomen and pelvis dated January 02, 2017 showed mild nodular hepatic margin suggestive of cirrhosis.  Pancreas normal.  Post cholecystectomy.  Large hernia in the right upper quadrant containing a nonobstructed loop of transverse colon. These films were reviewed.  These images have been personally reviewed.  Bowel is to the level of the pulmonary vessels on the right anterior chest wall.  Liver function studies dated February 15, 2017 were notable for an elevated GGT at 68 (0-60), other values were normal.  (Patient denies alcohol use).  Elevated serum haptoglobin level. Alpha-fetoprotein level is normal at 3.2.  Basic metabolic panel of February 12, 2017 is notable for a serum potassium of 2.7, creatinine 0.8, sodium 138.  CBC of January 23, 2017 is notable for a white blood cell count 9000, hemoglobin of 11.4 and MCV of 83.6.  Elevated RDW at 16.5.  Platelet count 272,000.  Upper endoscopy of January 07, 2017 showed some irritation and erosions in the duodenal bulb and gastric antrum.  Mild intrinsic stricture of the distal esophagus was dilated.  A. DUODENUM BULB; COLD BIOPSY:  - EDEMATOUS DUODENAL MUCOSA WITH MILD CHRONIC ACTIVE DUODENITIS.  - NEGATIVE FOR VIRUS CYTOPATHIC EFFECT, PARASITE, DYSPLASIA, AND  MALIGNANCY.   B. STOMACH, ANTRUM; COLD BIOPSY:  - MODERATE CHRONIC GASTRITIS.  - IMMUNOHISTOCHEMICAL STAIN FOR H. PYLORI IS NEGATIVE.  - NEGATIVE FOR DYSPLASIA AND MALIGNANCY.   C. GEJ; COLD BIOPSY:  - EDEMATOUS GASTRIC CARDIAC TYPE MUCOSA WITH MODERATE CHRONIC ACTIVE  GASTRITIS.  - FOCAL INTESTINAL METAPLASIA, SEE COMMENT.  - SQUAMOUS MUCOSA IS NOT PRESENT FOR EVALUATION.  - IMMUNOHISTOCHEMICAL  STAIN FOR H. PYLORI IS NEGATIVE.  - NEGATIVE FOR DYSPLASIA AND MALIGNANCY.   D. ESOPHAGUS, RANDOM; COLD BIOPSY:  - SQUAMOUS MUCOSA WITH MILD NONSPECIFIC ESOPHAGITIS.  - PAS F STAIN IS NEGATIVE FOR FUNGUS / YEAST.  - NEGATIVE FOR INTRAEPITHELIAL EOSINOPHILS, DYSPLASIA, AND MALIGNANCY.    Colonoscopy dated November 15, 2016 showed 3 small polyps A. COLON POLYP, ASCENDING; COLD BIOPSY:  - FOCAL ADENOMATOUS CHANGE.  - NEGATIVE FOR DYSPLASIA AND MALIGNANCY.   B. COLON POLYP, TRANSVERSE; COLD SNARE:  - TUBULAR ADENOMA.  - NEGATIVE FOR HIGH-GRADE DYSPLASIA AND MALIGNANCY.   C. COLON POLYP, DESCENDING; COLD SNARE:  - FOOD DEBRIS ONLY.  . February 14, 2017 GI progress note reviewed.  Negative evaluation for biochemical evidence of cirrhosis.  Assessment  Profound weight loss with negative evaluation to date, with the exception of a large hernia sac involving the right lower chest and including the transverse colon.    Plan    While there was no evidence of obstruction on the CT, the patient may have loops of small bowel that are entering this area when she is upright and with solid foods prompting postprandial pain, nausea and vomiting.  She will likely benefit from elective repair of this hernia.  This may require component separation.    Patient to have a small bowel follow-through x-ray done. The patient is aware to call back for any questions or concerns. HPI, Physical Exam, Assessment and Plan have been scribed under the direction and in the presence of Hervey Ard, MD.  Gaspar Cola, CMA  I have completed the exam and reviewed the above documentation for accuracy and completeness.  I agree with the above.  Haematologist has been used and any errors in dictation or transcription are unintentional.  Hervey Ard, M.D., F.A.C.S.  The patient is scheduled for a Small Bowel Follow Thru at [facility] on 02/22/17 at 11:00 am. She is to arrive by 10:45 am and have nothing to  eat or drink after midnight the night prior. The patient is aware of date, time, and instructions.  Documented by Caryl-Lyn Otis Brace LPN  Forest Gleason Byrnett 02/20/2017, 8:42 PM

## 2017-02-19 NOTE — Telephone Encounter (Signed)
Patient notiifed

## 2017-02-20 ENCOUNTER — Other Ambulatory Visit (INDEPENDENT_AMBULATORY_CARE_PROVIDER_SITE_OTHER): Payer: PPO

## 2017-02-20 DIAGNOSIS — I10 Essential (primary) hypertension: Secondary | ICD-10-CM | POA: Diagnosis not present

## 2017-02-20 LAB — BASIC METABOLIC PANEL
BUN: 7 mg/dL (ref 6–23)
CALCIUM: 9.6 mg/dL (ref 8.4–10.5)
CHLORIDE: 102 meq/L (ref 96–112)
CO2: 28 meq/L (ref 19–32)
Creatinine, Ser: 0.77 mg/dL (ref 0.40–1.20)
GFR: 79.01 mL/min (ref >60.00)
GLUCOSE: 102 mg/dL — AB (ref 70–99)
Potassium: 3.5 mEq/L (ref 3.5–5.1)
SODIUM: 139 meq/L (ref 135–145)

## 2017-02-21 ENCOUNTER — Other Ambulatory Visit: Payer: Self-pay | Admitting: Family Medicine

## 2017-02-21 DIAGNOSIS — E876 Hypokalemia: Secondary | ICD-10-CM

## 2017-02-22 ENCOUNTER — Ambulatory Visit
Admission: RE | Admit: 2017-02-22 | Discharge: 2017-02-22 | Disposition: A | Discharge status: Home / Self Care | Payer: PPO | Source: Ambulatory Visit | Attending: General Surgery | Admitting: General Surgery

## 2017-02-22 DIAGNOSIS — K439 Ventral hernia without obstruction or gangrene: Secondary | ICD-10-CM | POA: Diagnosis not present

## 2017-02-22 DIAGNOSIS — R1110 Vomiting, unspecified: Secondary | ICD-10-CM | POA: Diagnosis not present

## 2017-02-22 DIAGNOSIS — R634 Abnormal weight loss: Secondary | ICD-10-CM | POA: Insufficient documentation

## 2017-02-25 ENCOUNTER — Other Ambulatory Visit: Payer: Self-pay | Admitting: General Surgery

## 2017-02-25 ENCOUNTER — Telehealth: Payer: Self-pay | Admitting: *Deleted

## 2017-02-25 ENCOUNTER — Encounter
Admission: RE | Admit: 2017-02-25 | Discharge: 2017-02-25 | Disposition: A | Discharge status: Home / Self Care | Payer: PPO | Source: Ambulatory Visit | Attending: General Surgery | Admitting: General Surgery

## 2017-02-25 ENCOUNTER — Telehealth: Payer: Self-pay

## 2017-02-25 ENCOUNTER — Other Ambulatory Visit: Payer: Self-pay

## 2017-02-25 DIAGNOSIS — K439 Ventral hernia without obstruction or gangrene: Secondary | ICD-10-CM

## 2017-02-25 DIAGNOSIS — Z01818 Encounter for other preprocedural examination: Secondary | ICD-10-CM | POA: Insufficient documentation

## 2017-02-25 HISTORY — DX: Gastro-esophageal reflux disease without esophagitis: K21.9

## 2017-02-25 LAB — CBC
HEMATOCRIT: 37 % (ref 35.0–47.0)
Hemoglobin: 12.1 g/dL (ref 12.0–16.0)
MCH: 26.9 pg (ref 26.0–34.0)
MCHC: 32.7 g/dL (ref 32.0–36.0)
MCV: 82.2 fL (ref 80.0–100.0)
PLATELETS: 195 10*3/uL (ref 150–440)
RBC: 4.5 MIL/uL (ref 3.80–5.20)
RDW: 16.7 % — AB (ref 11.5–14.5)
WBC: 7.8 10*3/uL (ref 3.6–11.0)

## 2017-02-25 LAB — POTASSIUM: POTASSIUM: 2.5 mmol/L — AB (ref 3.5–5.1)

## 2017-02-25 NOTE — Telephone Encounter (Signed)
Fyi.

## 2017-02-25 NOTE — Telephone Encounter (Signed)
Per Judeen Hammans with Pre-admission Testing, patient's potassium was 2.5 mmol/L today.   Dr. Bary Castilla notfied.   Patient states she does not have any more potassium pills at home.   A prescription was called in to [facility] on [address] for potassium chloride 20 meq tablets take 4 times per day #40 no refills.   Patient aware to begin prescription today and that labs will be rechecked the morning of surgery.

## 2017-02-25 NOTE — Patient Instructions (Signed)
Your procedure is scheduled on: Friday 03/01/17 Report to [facility]. 2ND FLOOR MEDICAL MALL ENTRANCE. To find out your arrival time please call [phone] between 1PM - 3PM on Thursday 02/28/17.  Remember: Instructions that are not followed completely may result in serious medical risk, up to and including death, or upon the discretion of your surgeon and anesthesiologist your surgery may need to be rescheduled.    __X__ 1. Do not eat anything after midnight the night before your    procedure.  No gum chewing or hard candies.  You may drink clear   liquids up to 2 hours before you are scheduled to arrive at the   hospital for your procedure. Do not drink clear liquids within 2   hours of scheduled arrival to the hospital as this may lead to your   procedure being delayed or rescheduled.       Clear liquids include:   Water or Apple juice without pulp   Clear carbohydrate beverage such as Clearfast or Gatorade   Black coffee or Clear Tea (no milk, no creamer, do not add anything   to the coffee or tea)    Diabetics should only drink water   __X__ 2. No Alcohol for 24 hours before or after surgery.   ____ 3. Bring all medications with you on the day of surgery if instructed.    __X__ 4. Notify your doctor if there is any change in your medical condition     (cold, fever, infections).             __X___5. No smoking within 24 hours of your surgery.     Do not wear jewelry, make-up, hairpins, clips or nail polish.  Do not wear lotions, powders, or perfumes.   Do not shave 48 hours prior to surgery. Men may shave face and neck.  Do not bring valuables to the hospital.    [facility] [address] is not responsible for any belongings or valuables.               Contacts, dentures or bridgework may not be worn into surgery.  Leave your suitcase in the car. After surgery it may be brought to your room.  For patients admitted to the hospital, discharge time is determined by your                 treatment team.   Patients discharged the day of surgery will not be allowed to drive home.   Please read over the following fact sheets that you were given:   MRSA Information   __X__ Take these medicines the morning of surgery with A SIP OF WATER:    1. PANTOPRAZOLE/PROTONIX  2.   3.   4.  5.  6.  ____ Fleet Enema (as directed)   ____ Use CHG Soap/SAGE wipes as directed  __X__ Use inhalers on the day of surgery  ____ Stop metformin 2 days prior to surgery    ____ Take 1/2 of usual insulin dose the night before surgery and none on the morning of surgery.   ____ Stop Coumadin/Plavix/aspirin on   __X__ Stop Anti-inflammatories such as Advil, Aleve, Ibuprofen, Motrin, Naproxen, Naprosyn, Goodies,powder, or aspirin products.  OK to take Tylenol.   __X__ Stop supplements, Vitamin E, Fish Oil until after surgery.  (STOP [place] JOHNS WART, FISH OIL UNTIL AFTER SURGERY)  ____ Bring C-Pap to the hospital.

## 2017-02-25 NOTE — Pre-Procedure Instructions (Addendum)
Sharyn Lull at [facility] notified patient has K+ of 2.5. Copy of results faxed to [facility] and [facility] office. Will recheck DOS.

## 2017-02-26 ENCOUNTER — Ambulatory Visit (INDEPENDENT_AMBULATORY_CARE_PROVIDER_SITE_OTHER): Payer: PPO | Admitting: General Surgery

## 2017-02-26 ENCOUNTER — Inpatient Hospital Stay: Admission: RE | Admit: 2017-02-26 | Payer: PPO | Source: Ambulatory Visit

## 2017-02-26 VITALS — BP 142/88 | HR 68 | Resp 16 | Ht 65.0 in | Wt 156.0 lb

## 2017-02-26 DIAGNOSIS — K439 Ventral hernia without obstruction or gangrene: Secondary | ICD-10-CM

## 2017-02-26 DIAGNOSIS — H353131 Nonexudative age-related macular degeneration, bilateral, early dry stage: Secondary | ICD-10-CM | POA: Diagnosis not present

## 2017-02-26 NOTE — Patient Instructions (Addendum)
The patient is aware to call back for any questions or concerns. Stop taking the [place] drinking nutritional supplements this week Hernia, Adult A hernia is the bulging of an organ or tissue through a weak spot in the muscles of the abdomen (abdominal wall). Hernias develop most often near the navel or groin. There are many kinds of hernias. Common kinds include:  Femoral hernia. This kind of hernia develops under the groin in the upper thigh area.  Inguinal hernia. This kind of hernia develops in the groin or scrotum.  Umbilical hernia. This kind of hernia develops near the navel.  Hiatal hernia. This kind of hernia causes part of the stomach to be pushed up into the chest.  Incisional hernia. This kind of hernia bulges through a scar from an abdominal surgery.  What are the causes? This condition may be caused by:  Heavy lifting.  Coughing over a long period of time.  Straining to have a bowel movement.  An incision made during an abdominal surgery.  A birth defect (congenital defect).  Excess weight or obesity.  Smoking.  Poor nutrition.  Cystic fibrosis.  Excess fluid in the abdomen.  Undescended testicles.  What are the signs or symptoms? Symptoms of a hernia include:  A lump on the abdomen. This is the first sign of a hernia. The lump may become more obvious with standing, straining, or coughing. It may get bigger over time if it is not treated or if the condition causing it is not treated.  Pain. A hernia is usually painless, but it may become painful over time if treatment is delayed. The pain is usually dull and may get worse with standing or lifting heavy objects.  Sometimes a hernia gets tightly squeezed in the weak spot (strangulated) or stuck there (incarcerated) and causes additional symptoms. These symptoms may include:  Vomiting.  Nausea.  Constipation.  Irritability.  How is this diagnosed? A hernia may be diagnosed  with:  A physical exam. During the exam your health care provider may ask you to cough or to make a specific movement, because a hernia is usually more visible when you move.  Imaging tests. These can include: ? X-rays. ? Ultrasound. ? CT scan.  How is this treated? A hernia that is small and painless may not need to be treated. A hernia that is large or painful may be treated with surgery. Inguinal hernias may be treated with surgery to prevent incarceration or strangulation. Strangulated hernias are always treated with surgery, because lack of blood to the trapped organ or tissue can cause it to die. Surgery to treat a hernia involves pushing the bulge back into place and repairing the weak part of the abdomen. Follow these instructions at home:  Avoid straining.  Do not lift anything heavier than 10 lb (4.5 kg).  Lift with your leg muscles, not your back muscles. This helps avoid strain.  When coughing, try to cough gently.  Prevent constipation. Constipation leads to straining with bowel movements, which can make a hernia worse or cause a hernia repair to break down. You can prevent constipation by: ? Eating a high-fiber diet that includes plenty of fruits and vegetables. ? Drinking enough fluids to keep your urine clear or pale yellow. Aim to drink 6-8 glasses of water per day. ? Using a stool softener as directed by your health care provider.  Lose weight, if you are overweight.  Do not use any tobacco products, including cigarettes, chewing tobacco,  or electronic cigarettes. If you need help quitting, ask your health care provider.  Keep all follow-up visits as directed by your health care provider. This is important. Your health care provider may need to monitor your condition. Contact a health care provider if:  You have swelling, redness, and pain in the affected area.  Your bowel habits change. Get help right away if:  You have a fever.  You have abdominal pain  that is getting worse.  You feel nauseous or you vomit.  You cannot push the hernia back in place by gently pressing on it while you are lying down.  The hernia: ? Changes in shape or size. ? Is stuck outside the abdomen. ? Becomes discolored. ? Feels hard or tender. This information is not intended to replace advice given to you by your health care provider. Make sure you discuss any questions you have with your health care provider. Document Released: 01/22/2005 Document Revised: 06/22/2015 Document Reviewed: 12/02/2013 Elsevier Interactive Patient Education  2017 [employer].

## 2017-02-26 NOTE — Progress Notes (Signed)
Patient ID: Anne [name], female   DOB: 1950/10/19, 67 y.o.   MRN: [id number]  Chief Complaint  Patient presents with  . Pre-op Exam    HPI Anne [name] is a 67 y.o. female here today for her pre op ventral hernia repair scheduled on 03/01/2017. The patient reported that it has been so long since she was able to eat without discomfort that she is really lost her appetite. She has been at her eye appointment this morning. She states she has no appetite.  She is here with her husband, [name].   HPI  Past Medical History:  Diagnosis Date  . Arthritis   . Depression   . GERD (gastroesophageal reflux disease)   . Hyperlipidemia   . Hypertension     Past Surgical History:  Procedure Laterality Date  . APPENDECTOMY  1971  . CHOLECYSTECTOMY  1971  . COLONOSCOPY WITH PROPOFOL N/A 11/15/2016   Procedure: COLONOSCOPY WITH PROPOFOL;  Surgeon: Jonathon Bellows, MD;  Location: [facility] ENDOSCOPY;  Service: Gastroenterology;  Laterality: N/A;  . ESOPHAGOGASTRODUODENOSCOPY (EGD) WITH PROPOFOL N/A 01/07/2017   Procedure: ESOPHAGOGASTRODUODENOSCOPY (EGD) WITH PROPOFOL;  Surgeon: Jonathon Bellows, MD;  Location: [facility] ENDOSCOPY;  Service: Gastroenterology;  Laterality: N/A;  . TONSILECTOMY, ADENOIDECTOMY, BILATERAL MYRINGOTOMY AND TUBES  1955  . TONSILLECTOMY      Family History  Problem Relation Age of Onset  . Cancer Mother        ovarian  . Hypertension Mother   . Ovarian cancer Mother 29  . Heart disease Father   . Stroke Father   . Ovarian cancer Sister        ? dx cancer had hyst.  . Breast cancer Neg Hx     Social History Social History   Tobacco Use  . Smoking status: Former Smoker    Last attempt to quit: 02/07/2012    Years since quitting: 5.0  . Smokeless tobacco: Never Used  Substance Use Topics  . Alcohol use: No    Alcohol/week: 0.0 oz  . Drug use: No    No Known Allergies  Current Outpatient Medications  Medication Sig Dispense Refill  . cholecalciferol (VITAMIN D) 1000  units tablet Take 1,000 Units by mouth daily.    . cyclobenzaprine (FLEXERIL) 10 MG tablet Take 10 mg by mouth at bedtime as needed.     . folic acid (FOLVITE) 1 MG tablet Take 1 tablet (1 mg total) by mouth daily. 30 tablet 0  . ibuprofen (ADVIL,MOTRIN) 200 MG tablet Take 400 mg by mouth every 8 (eight) hours as needed (for headaches).    . Multiple Vitamins-Minerals (EQ VISION FORMULA 50+ PO) Take 1 capsule by mouth daily.    Marland Kitchen omega-3 fish oil (MAXEPA) 1000 MG CAPS capsule Take 1,000 mg by mouth daily.    . ondansetron (ZOFRAN) 4 MG tablet Take 1 tablet (4 mg total) by mouth every 8 (eight) hours as needed for nausea or vomiting. 30 tablet 1  . oxybutynin (DITROPAN-XL) 5 MG 24 hr tablet TAKE 1 TABLET BY MOUTH AT BEDTIME 90 tablet 1  . pantoprazole (PROTONIX) 40 MG tablet Take 1 tablet (40 mg total) by mouth 2 (two) times daily before a meal. (Patient taking differently: Take 40 mg by mouth at bedtime. ) 60 tablet 2  . PARoxetine (PAXIL) 20 MG tablet TAKE ONE TABLET BY MOUTH ONCE DAILY (Patient taking differently: TAKE ONE TABLET BY MOUTH ONCE DAILY AT NIGHT) 90 tablet 2  . potassium chloride (K-DUR) 10 MEQ tablet Take  2 tablets (20 mEq total) by mouth daily. Fill on 02/15/17. (Patient taking differently: Take 10 mEq by mouth daily. Fill on 02/15/17.) 30 tablet 1  . promethazine (PHENERGAN) 12.5 MG tablet Take 1 tablet (12.5 mg total) by mouth every 8 (eight) hours as needed for nausea or vomiting. 20 tablet 0  . ramipril (ALTACE) 2.5 MG capsule Take 1 capsule (2.5 mg total) by mouth daily. 90 capsule 3  . rosuvastatin (CRESTOR) 20 MG tablet Take 1 tablet (20 mg total) by mouth daily. (Patient taking differently: Take 20 mg by mouth at bedtime. ) 90 tablet 3  . [place] Wort 300 MG CAPS Take 300 mg by mouth daily.     No current facility-administered medications for this visit.     Review of Systems Review of Systems  Constitutional: Negative.   Respiratory: Negative.   Cardiovascular:  Negative.     Blood pressure (!) 142/88, pulse 68, resp. rate 16, height 5\' 5"  (1.651 m), weight 156 lb (70.8 kg).  Physical Exam Physical Exam  Constitutional: She is oriented to person, place, and time. She appears well-developed and well-nourished.  HENT:  Mouth/Throat: Oropharynx is clear and moist.  Eyes: Conjunctivae are normal. No scleral icterus.  Neck: Neck supple.  Cardiovascular: Normal rate, regular rhythm and normal heart sounds.  No lower leg edema  Pulmonary/Chest: Effort normal and breath sounds normal.  Abdominal: Soft. Normal appearance. A hernia is present. Hernia confirmed positive in the ventral area.    Lymphadenopathy:    She has no cervical adenopathy.  Neurological: She is alert and oriented to person, place, and time.  Skin: Skin is warm and dry.  Psychiatric: Her behavior is normal.    Data Reviewed The patient was unable to drink barium for the planned small bowel follow-through.  I spoke with her primary care physician who was unaware of any cardiac problems that would preclude safe general anesthesia.  Assessment    Ventral hernia at the site of her previous cholecystectomy incision with a large amount of contained colon.  Marked weight loss.    Plan    No cause for her inability to eat has been detected on her upper endoscopy, colonoscopy (remarkably completed in light of the amount of colon within the hernia sac) or other laboratory/imaging studies. The patient has been found to be profoundly hypokalemic on multiple determinations in spite of no potassium wasting medications.  She should stop taking the [facility].   Recommend drinking nutritional supplements this week to better prepare herself for surgery.  Plans for a diagnostic laparoscopy to assess for any other reasons for her weight loss and hopefully to be able to reduce and repair her hernia in this manner, both strong possibility for an open repair based on the duration of her  symptoms was reviewed.  The role of prosthetic mesh was discussed.  She may require overnight or longer hospitalization, especially if a open procedure is required.  Hernia precautions and incarceration were discussed with the patient. If they develop symptoms of an incarcerated hernia, they were encouraged to seek prompt medical attention.  I have recommended repair of the hernia using mesh on an outpatient basis in the near future. The risk of infection was reviewed. The role of prosthetic mesh to minimize the risk of recurrence was reviewed.  HPI, Physical Exam, Assessment and Plan have been scribed under the direction and in the presence of Robert Bellow, MD. Karie Fetch, RN  I have completed the exam  and reviewed the above documentation for accuracy and completeness.  I agree with the above.  Haematologist has been used and any errors in dictation or transcription are unintentional.  Hervey Ard, M.D., F.A.C.S.  Forest Gleason Issaac Shipper 02/28/2017, 6:24 AM

## 2017-02-27 ENCOUNTER — Telehealth: Payer: Self-pay

## 2017-02-27 ENCOUNTER — Other Ambulatory Visit: Payer: PPO

## 2017-02-27 NOTE — Telephone Encounter (Signed)
Called patient to verify if she is taking potassium, we have received a fax from [place] stating her potassium is low. Elizabeth for pec to speak with patient

## 2017-02-28 ENCOUNTER — Ambulatory Visit: Payer: PPO | Admitting: Podiatry

## 2017-02-28 MED ORDER — CEFAZOLIN SODIUM-DEXTROSE 2-4 GM/100ML-% IV SOLN
2.0000 g | INTRAVENOUS | Status: AC
  Administered 2017-03-01: 2 g via INTRAVENOUS

## 2017-03-01 ENCOUNTER — Encounter
Admission: RE | Disposition: A | Discharge status: Home / Self Care | Payer: Self-pay | Source: Ambulatory Visit | Attending: General Surgery

## 2017-03-01 ENCOUNTER — Ambulatory Visit: Payer: PPO | Admitting: Anesthesiology

## 2017-03-01 ENCOUNTER — Observation Stay
Admission: RE | Admit: 2017-03-01 | Discharge: 2017-03-02 | Disposition: A | Discharge status: Home / Self Care | Payer: PPO | Source: Ambulatory Visit | Attending: General Surgery | Admitting: General Surgery

## 2017-03-01 ENCOUNTER — Other Ambulatory Visit: Payer: Self-pay

## 2017-03-01 DIAGNOSIS — K219 Gastro-esophageal reflux disease without esophagitis: Secondary | ICD-10-CM | POA: Diagnosis not present

## 2017-03-01 DIAGNOSIS — K7460 Unspecified cirrhosis of liver: Secondary | ICD-10-CM | POA: Insufficient documentation

## 2017-03-01 DIAGNOSIS — Z87891 Personal history of nicotine dependence: Secondary | ICD-10-CM | POA: Diagnosis not present

## 2017-03-01 DIAGNOSIS — R634 Abnormal weight loss: Secondary | ICD-10-CM | POA: Diagnosis not present

## 2017-03-01 DIAGNOSIS — F329 Major depressive disorder, single episode, unspecified: Secondary | ICD-10-CM | POA: Diagnosis not present

## 2017-03-01 DIAGNOSIS — E785 Hyperlipidemia, unspecified: Secondary | ICD-10-CM | POA: Diagnosis not present

## 2017-03-01 DIAGNOSIS — I10 Essential (primary) hypertension: Secondary | ICD-10-CM | POA: Insufficient documentation

## 2017-03-01 DIAGNOSIS — K432 Incisional hernia without obstruction or gangrene: Secondary | ICD-10-CM | POA: Diagnosis not present

## 2017-03-01 DIAGNOSIS — K439 Ventral hernia without obstruction or gangrene: Secondary | ICD-10-CM | POA: Diagnosis not present

## 2017-03-01 DIAGNOSIS — M1990 Unspecified osteoarthritis, unspecified site: Secondary | ICD-10-CM | POA: Diagnosis not present

## 2017-03-01 DIAGNOSIS — Z79899 Other long term (current) drug therapy: Secondary | ICD-10-CM | POA: Diagnosis not present

## 2017-03-01 HISTORY — PX: VENTRAL HERNIA REPAIR: SHX424

## 2017-03-01 HISTORY — DX: Unspecified cirrhosis of liver: K74.60

## 2017-03-01 HISTORY — PX: LAPAROSCOPY: SHX197

## 2017-03-01 LAB — POCT I-STAT 4, (NA,K, GLUC, HGB,HCT)
Glucose, Bld: 91 mg/dL (ref 65–99)
HCT: 32 % — ABNORMAL LOW (ref 36.0–46.0)
Hemoglobin: 10.9 g/dL — ABNORMAL LOW (ref 12.0–15.0)
POTASSIUM: 4.1 mmol/L (ref 3.5–5.1)
SODIUM: 138 mmol/L (ref 135–145)

## 2017-03-01 LAB — NASH FIBROSURE
ALPHA 2-MACROGLOBULINS, QN: 230 mg/dL (ref 110–276)
ALT (SGPT) P5P: 10 IU/L (ref 0–40)
APOLIPOPROTEIN A I: 144 mg/dL (ref 116–209)
AST (SGOT) P5P: 26 IU/L (ref 0–40)
Bilirubin, Total: 0.7 mg/dL (ref 0.0–1.2)
Cholesterol, Total: 133 mg/dL (ref 100–199)
Fibrosis Score: 0.39 — ABNORMAL HIGH (ref 0.00–0.21)
GGT: 68 IU/L — ABNORMAL HIGH (ref 0–60)
Glucose: 93 mg/dL (ref 65–99)
HAPTOGLOBIN: 312 mg/dL — AB (ref 34–200)
Height: 65 in
NASH SCORE: 0.5 — AB
STEATOSIS SCORE: 0.53 — AB (ref 0.00–0.30)
TRIGLYCERIDES: 146 mg/dL (ref 0–149)
Weight: 156 [lb_av]

## 2017-03-01 LAB — AFP TUMOR MARKER: AFP, SERUM, TUMOR MARKER: 3.2 ng/mL (ref 0.0–8.3)

## 2017-03-01 SURGERY — LAPAROSCOPY, DIAGNOSTIC
Anesthesia: General | Wound class: Clean

## 2017-03-01 MED ORDER — LACTATED RINGERS IV SOLN
INTRAVENOUS | Status: DC
  Administered 2017-03-01: 13:00:00 via INTRAVENOUS

## 2017-03-01 MED ORDER — PANTOPRAZOLE SODIUM 40 MG PO TBEC
40.0000 mg | DELAYED_RELEASE_TABLET | Freq: Every day | ORAL | Status: DC
  Administered 2017-03-01: 40 mg via ORAL
  Filled 2017-03-01: qty 1

## 2017-03-01 MED ORDER — GABAPENTIN 300 MG PO CAPS
300.0000 mg | ORAL_CAPSULE | ORAL | Status: AC
  Administered 2017-03-01: 300 mg via ORAL

## 2017-03-01 MED ORDER — ACETAMINOPHEN 650 MG RE SUPP: 650.0000 mg | Freq: Four times a day (QID) | RECTAL | Status: DC | PRN

## 2017-03-01 MED ORDER — PROMETHAZINE HCL 25 MG/ML IJ SOLN: 6.2500 mg | INTRAMUSCULAR | Status: DC | PRN

## 2017-03-01 MED ORDER — GABAPENTIN 300 MG PO CAPS
ORAL_CAPSULE | ORAL | Status: AC
  Administered 2017-03-01: 300 mg via ORAL
  Filled 2017-03-01: qty 1

## 2017-03-01 MED ORDER — SUGAMMADEX SODIUM 200 MG/2ML IV SOLN
INTRAVENOUS | Status: DC | PRN
  Administered 2017-03-01: 150 mg via INTRAVENOUS

## 2017-03-01 MED ORDER — ONDANSETRON HCL 4 MG PO TABS: 4.0000 mg | ORAL_TABLET | Freq: Three times a day (TID) | ORAL | Status: DC | PRN

## 2017-03-01 MED ORDER — GLYCOPYRROLATE 0.2 MG/ML IJ SOLN
INTRAMUSCULAR | Status: AC
  Filled 2017-03-01: qty 1

## 2017-03-01 MED ORDER — PROMETHAZINE HCL 25 MG PO TABS: 12.5000 mg | ORAL_TABLET | Freq: Three times a day (TID) | ORAL | Status: DC | PRN

## 2017-03-01 MED ORDER — PHENYLEPHRINE HCL 10 MG/ML IJ SOLN
INTRAMUSCULAR | Status: AC
  Filled 2017-03-01: qty 1

## 2017-03-01 MED ORDER — HYDROCODONE-ACETAMINOPHEN 5-325 MG PO TABS: 1.0000 | ORAL_TABLET | ORAL | 0 refills | Status: DC | PRN

## 2017-03-01 MED ORDER — CEFAZOLIN SODIUM-DEXTROSE 2-4 GM/100ML-% IV SOLN
INTRAVENOUS | Status: AC
  Filled 2017-03-01: qty 100

## 2017-03-01 MED ORDER — ONDANSETRON HCL 4 MG/2ML IJ SOLN
INTRAMUSCULAR | Status: DC | PRN
  Administered 2017-03-01: 4 mg via INTRAVENOUS

## 2017-03-01 MED ORDER — PROPOFOL 10 MG/ML IV BOLUS
INTRAVENOUS | Status: DC | PRN
  Administered 2017-03-01: 120 mg via INTRAVENOUS

## 2017-03-01 MED ORDER — PHENYLEPHRINE HCL 10 MG/ML IJ SOLN
INTRAMUSCULAR | Status: DC | PRN
  Administered 2017-03-01 (×3): 200 ug via INTRAVENOUS

## 2017-03-01 MED ORDER — LACTATED RINGERS IV SOLN
INTRAVENOUS | Status: DC
  Administered 2017-03-01: 07:00:00 via INTRAVENOUS

## 2017-03-01 MED ORDER — MEPERIDINE HCL 50 MG/ML IJ SOLN: 6.2500 mg | INTRAMUSCULAR | Status: DC | PRN

## 2017-03-01 MED ORDER — OXYBUTYNIN CHLORIDE ER 5 MG PO TB24
5.0000 mg | ORAL_TABLET | Freq: Every day | ORAL | Status: DC
  Administered 2017-03-01: 5 mg via ORAL
  Filled 2017-03-01: qty 1

## 2017-03-01 MED ORDER — ROCURONIUM BROMIDE 100 MG/10ML IV SOLN
INTRAVENOUS | Status: DC | PRN
  Administered 2017-03-01: 40 mg via INTRAVENOUS
  Administered 2017-03-01: 30 mg via INTRAVENOUS
  Administered 2017-03-01: 10 mg via INTRAVENOUS

## 2017-03-01 MED ORDER — FENTANYL CITRATE (PF) 100 MCG/2ML IJ SOLN
25.0000 ug | INTRAMUSCULAR | Status: AC | PRN
  Administered 2017-03-01 (×6): 25 ug via INTRAVENOUS

## 2017-03-01 MED ORDER — CELECOXIB 200 MG PO CAPS
200.0000 mg | ORAL_CAPSULE | ORAL | Status: AC
  Administered 2017-03-01: 200 mg via ORAL

## 2017-03-01 MED ORDER — MORPHINE SULFATE (PF) 2 MG/ML IV SOLN: 2.0000 mg | INTRAVENOUS | Status: DC | PRN

## 2017-03-01 MED ORDER — SEVOFLURANE IN SOLN
RESPIRATORY_TRACT | Status: AC
  Filled 2017-03-01: qty 250

## 2017-03-01 MED ORDER — CELECOXIB 200 MG PO CAPS
ORAL_CAPSULE | ORAL | Status: AC
Start: 2017-03-01 — End: 2017-03-01
  Administered 2017-03-01: 200 mg via ORAL
  Filled 2017-03-01: qty 1

## 2017-03-01 MED ORDER — SUGAMMADEX SODIUM 200 MG/2ML IV SOLN
INTRAVENOUS | Status: AC
  Filled 2017-03-01: qty 2

## 2017-03-01 MED ORDER — LIDOCAINE HCL (CARDIAC) 20 MG/ML IV SOLN
INTRAVENOUS | Status: DC | PRN
  Administered 2017-03-01: 80 mg via INTRAVENOUS

## 2017-03-01 MED ORDER — ACETAMINOPHEN 10 MG/ML IV SOLN
INTRAVENOUS | Status: DC | PRN
  Administered 2017-03-01: 1000 mg via INTRAVENOUS

## 2017-03-01 MED ORDER — BUPIVACAINE-EPINEPHRINE (PF) 0.5% -1:200000 IJ SOLN
INTRAMUSCULAR | Status: AC
  Filled 2017-03-01: qty 30

## 2017-03-01 MED ORDER — ROCURONIUM BROMIDE 50 MG/5ML IV SOLN
INTRAVENOUS | Status: AC
  Filled 2017-03-01: qty 1

## 2017-03-01 MED ORDER — MIDAZOLAM HCL 2 MG/2ML IJ SOLN
INTRAMUSCULAR | Status: DC | PRN
  Administered 2017-03-01 (×2): 2 mg via INTRAVENOUS

## 2017-03-01 MED ORDER — DEXAMETHASONE SODIUM PHOSPHATE 10 MG/ML IJ SOLN
INTRAMUSCULAR | Status: DC | PRN
  Administered 2017-03-01: 10 mg via INTRAVENOUS

## 2017-03-01 MED ORDER — FENTANYL CITRATE (PF) 100 MCG/2ML IJ SOLN
INTRAMUSCULAR | Status: AC
  Filled 2017-03-01: qty 2

## 2017-03-01 MED ORDER — EPHEDRINE SULFATE 50 MG/ML IJ SOLN
INTRAMUSCULAR | Status: AC
  Filled 2017-03-01: qty 1

## 2017-03-01 MED ORDER — ACETAMINOPHEN 10 MG/ML IV SOLN
INTRAVENOUS | Status: AC
  Filled 2017-03-01: qty 100

## 2017-03-01 MED ORDER — PROPOFOL 10 MG/ML IV BOLUS
INTRAVENOUS | Status: AC
  Filled 2017-03-01: qty 20

## 2017-03-01 MED ORDER — OXYCODONE HCL 5 MG PO TABS: 5.0000 mg | ORAL_TABLET | Freq: Once | ORAL | Status: DC | PRN

## 2017-03-01 MED ORDER — MIDAZOLAM HCL 2 MG/2ML IJ SOLN
INTRAMUSCULAR | Status: AC
  Filled 2017-03-01: qty 2

## 2017-03-01 MED ORDER — ONDANSETRON HCL 4 MG/2ML IJ SOLN
INTRAMUSCULAR | Status: AC
  Filled 2017-03-01: qty 2

## 2017-03-01 MED ORDER — HYDROCODONE-ACETAMINOPHEN 5-325 MG PO TABS
1.0000 | ORAL_TABLET | ORAL | Status: DC | PRN
  Administered 2017-03-01: 1 via ORAL
  Filled 2017-03-01: qty 1

## 2017-03-01 MED ORDER — LIDOCAINE HCL (PF) 2 % IJ SOLN
INTRAMUSCULAR | Status: AC
  Filled 2017-03-01: qty 10

## 2017-03-01 MED ORDER — DEXAMETHASONE SODIUM PHOSPHATE 10 MG/ML IJ SOLN
INTRAMUSCULAR | Status: AC
  Filled 2017-03-01: qty 1

## 2017-03-01 MED ORDER — FENTANYL CITRATE (PF) 100 MCG/2ML IJ SOLN
INTRAMUSCULAR | Status: DC | PRN
  Administered 2017-03-01 (×4): 50 ug via INTRAVENOUS

## 2017-03-01 MED ORDER — IBUPROFEN 400 MG PO TABS: 400.0000 mg | ORAL_TABLET | Freq: Three times a day (TID) | ORAL | Status: DC | PRN

## 2017-03-01 MED ORDER — ACETAMINOPHEN 325 MG PO TABS: 650.0000 mg | ORAL_TABLET | Freq: Four times a day (QID) | ORAL | Status: DC | PRN

## 2017-03-01 MED ORDER — FENTANYL CITRATE (PF) 100 MCG/2ML IJ SOLN
INTRAMUSCULAR | Status: AC
  Administered 2017-03-01: 25 ug via INTRAVENOUS
  Filled 2017-03-01: qty 2

## 2017-03-01 MED ORDER — SUCCINYLCHOLINE CHLORIDE 20 MG/ML IJ SOLN
INTRAMUSCULAR | Status: AC
  Filled 2017-03-01: qty 1

## 2017-03-01 MED ORDER — OXYCODONE HCL 5 MG/5ML PO SOLN: 5.0000 mg | Freq: Once | ORAL | Status: DC | PRN

## 2017-03-01 MED ORDER — PAROXETINE HCL 20 MG PO TABS
20.0000 mg | ORAL_TABLET | Freq: Every day | ORAL | Status: DC
  Administered 2017-03-01 – 2017-03-02 (×2): 20 mg via ORAL
  Filled 2017-03-01 (×2): qty 1

## 2017-03-01 SURGICAL SUPPLY — 69 items
BAG COUNTER SPONGE EZ (MISCELLANEOUS) IMPLANT
BAG SPNG 4X4 CLR HAZ (MISCELLANEOUS)
BLADE SURG 10 STRL SS SAFETY (BLADE) ×2 IMPLANT
BLADE SURG 11 STRL SS SAFETY (MISCELLANEOUS) ×3 IMPLANT
BLADE SURG 15 STRL SS SAFETY (BLADE) ×3 IMPLANT
CANISTER SUCT 1200ML W/VALVE (MISCELLANEOUS) ×3 IMPLANT
CANNULA DILATOR  5MM W/SLV (CANNULA) ×1
CANNULA DILATOR 10 W/SLV (CANNULA) ×4 IMPLANT
CANNULA DILATOR 10MM W/SLV (CANNULA) ×2
CANNULA DILATOR 5 W/SLV (CANNULA) ×2 IMPLANT
CHLORAPREP W/TINT 26ML (MISCELLANEOUS) ×3 IMPLANT
CLOSURE WOUND 1/2 X4 (GAUZE/BANDAGES/DRESSINGS) ×1
COUNTER SPONGE BAG EZ (MISCELLANEOUS)
DEVICE SECURE STRAP 25 ABSORB (INSTRUMENTS) ×5 IMPLANT
DISSECTOR KITTNER STICK (MISCELLANEOUS) IMPLANT
DISSECTORS/KITTNER STICK (MISCELLANEOUS)
DRAIN CHANNEL JP 15F RND 16 (MISCELLANEOUS) ×6 IMPLANT
DRAPE CHEST BREAST 77X106 FENE (MISCELLANEOUS) ×3 IMPLANT
DRAPE LAPAROTOMY 100X77 ABD (DRAPES) ×3 IMPLANT
DRSG TEGADERM 2-3/8X2-3/4 SM (GAUZE/BANDAGES/DRESSINGS) ×9 IMPLANT
DRSG TEGADERM 4X4.75 (GAUZE/BANDAGES/DRESSINGS) ×6 IMPLANT
DRSG TELFA 3X8 NADH (GAUZE/BANDAGES/DRESSINGS) ×3 IMPLANT
DRSG TELFA 4X3 1S NADH ST (GAUZE/BANDAGES/DRESSINGS) ×3 IMPLANT
ELECT REM PT RETURN 9FT ADLT (ELECTROSURGICAL) ×3
ELECTRODE REM PT RTRN 9FT ADLT (ELECTROSURGICAL) ×1 IMPLANT
GAUZE SPONGE 4X4 12PLY STRL (GAUZE/BANDAGES/DRESSINGS) ×3 IMPLANT
GLOVE BIO SURGEON STRL SZ7.5 (GLOVE) ×3 IMPLANT
GLOVE INDICATOR 8.0 STRL GRN (GLOVE) ×3 IMPLANT
GOWN STRL REUS W/ TWL LRG LVL3 (GOWN DISPOSABLE) ×2 IMPLANT
GOWN STRL REUS W/TWL LRG LVL3 (GOWN DISPOSABLE) ×6
GRASPER SUT TROCAR 14GX15 (MISCELLANEOUS) ×3 IMPLANT
IRRIGATION STRYKERFLOW (MISCELLANEOUS) IMPLANT
IRRIGATOR STRYKERFLOW (MISCELLANEOUS)
IV LACTATED RINGERS 1000ML (IV SOLUTION) ×3 IMPLANT
KIT RM TURNOVER STRD PROC AR (KITS) ×3 IMPLANT
LABEL OR SOLS (LABEL) ×3 IMPLANT
LIGASURE LAP MARYLAND 5MM 37CM (ELECTROSURGICAL) ×2 IMPLANT
MESH VENT LT ST 10.2X15.2CM EL (Mesh General) ×2 IMPLANT
NDL HYPO 25X1 1.5 SAFETY (NEEDLE) ×1 IMPLANT
NDL INSUFF ACCESS 14 VERSASTEP (NEEDLE) ×3 IMPLANT
NEEDLE HYPO 22GX1.5 SAFETY (NEEDLE) ×3 IMPLANT
NEEDLE HYPO 25X1 1.5 SAFETY (NEEDLE) ×3 IMPLANT
NS IRRIG 500ML POUR BTL (IV SOLUTION) ×3 IMPLANT
PACK BASIN MINOR ARMC (MISCELLANEOUS) ×3 IMPLANT
PACK LAP CHOLECYSTECTOMY (MISCELLANEOUS) ×6 IMPLANT
PAD DRESSING TELFA 3X8 NADH (GAUZE/BANDAGES/DRESSINGS) ×1 IMPLANT
POUCH ENDO CATCH 10MM SPEC (MISCELLANEOUS) IMPLANT
SCISSORS METZENBAUM CVD 33 (INSTRUMENTS) ×3 IMPLANT
SEAL FOR SCOPE WARMER C3101 (MISCELLANEOUS) ×3 IMPLANT
SPONGE LAP 18X18 5 PK (GAUZE/BANDAGES/DRESSINGS) ×3 IMPLANT
STAPLER SKIN PROX 35W (STAPLE) ×3 IMPLANT
STRIP CLOSURE SKIN 1/2X4 (GAUZE/BANDAGES/DRESSINGS) ×2 IMPLANT
SUT MAXON 0 T 12 3 (SUTURE) ×4 IMPLANT
SUT PDS PLUS 0 (SUTURE)
SUT PDS PLUS AB 0 CT-2 (SUTURE) IMPLANT
SUT SURGILON 0 BLK (SUTURE) ×6 IMPLANT
SUT VIC AB 0 SH 27 (SUTURE) ×3 IMPLANT
SUT VIC AB 2-0 BRD 54 (SUTURE) ×3 IMPLANT
SUT VIC AB 3-0 SH 27 (SUTURE) ×3
SUT VIC AB 3-0 SH 27X BRD (SUTURE) ×1 IMPLANT
SUT VIC AB 4-0 FS2 27 (SUTURE) ×3 IMPLANT
SUT VICRYL+ 3-0 144IN (SUTURE) ×3 IMPLANT
SWABSTK COMLB BENZOIN TINCTURE (MISCELLANEOUS) ×3 IMPLANT
SYR 3ML LL SCALE MARK (SYRINGE) ×3 IMPLANT
SYR CONTROL 10ML (SYRINGE) ×3 IMPLANT
TRAY FOLEY W/METER SILVER 16FR (SET/KITS/TRAYS/PACK) ×3 IMPLANT
TROCAR ENDO BLADELESS 11MM (ENDOMECHANICALS) ×2 IMPLANT
TROCAR XCEL UNIV SLVE 11M 100M (ENDOMECHANICALS) ×5 IMPLANT
TUBING INSUFFLATION (TUBING) ×3 IMPLANT

## 2017-03-01 NOTE — Addendum Note (Signed)
Addendum  created 03/01/17 1043 by Doreen Salvage, CRNA   Intraprocedure Meds edited

## 2017-03-01 NOTE — Transfer of Care (Signed)
Immediate Anesthesia Transfer of Care Note  Patient: Anne [name]  Procedure(s) Performed: Procedure(s): LAPAROSCOPY DIAGNOSTIC (N/A) LAPAROSCOPIC VENTRAL HERNIA (N/A) HERNIA REPAIR VENTRAL ADULT (N/A)  Patient Location: PACU  Anesthesia Type:General  Level of Consciousness: sedated  Airway & Oxygen Therapy: Patient Spontanous Breathing and Patient connected to face mask oxygen  Post-op Assessment: Report given to RN and Post -op Vital signs reviewed and stable  Post vital signs: Reviewed and stable  Last Vitals:  Vitals:   03/01/17 0630 03/01/17 0936  BP: 131/83 (!) 141/55  Pulse: 94 (!) 109  Resp: 18   Temp: (!) 35.9 C 36.8 C  SpO2: 340% 37%    Complications: No apparent anesthesia complications

## 2017-03-01 NOTE — H&P (Signed)
No change in clinical condition or exam.

## 2017-03-01 NOTE — Progress Notes (Signed)
The patient has been very slow to wake up, and when awakened reporting significant pain.  Will place in observation to see her progress prior to discharge home.

## 2017-03-01 NOTE — Anesthesia Postprocedure Evaluation (Signed)
Anesthesia Post Note  Patient: Anne [name]  Procedure(s) Performed: LAPAROSCOPY DIAGNOSTIC (N/A ) LAPAROSCOPIC VENTRAL HERNIA (N/A ) HERNIA REPAIR VENTRAL ADULT (N/A )  Patient location during evaluation: PACU Anesthesia Type: General Level of consciousness: awake and alert and oriented Pain management: pain level controlled Vital Signs Assessment: post-procedure vital signs reviewed and stable Respiratory status: spontaneous breathing, nonlabored ventilation and respiratory function stable Cardiovascular status: blood pressure returned to baseline and stable Postop Assessment: no signs of nausea or vomiting Anesthetic complications: no     Last Vitals:  Vitals:   03/01/17 0949 03/01/17 1003  BP: (!) 143/77 (!) 153/90  Pulse: 100 (!) 102  Resp: 18 16  Temp:    SpO2: 100% 100%    Last Pain:  Vitals:   03/01/17 0630  TempSrc: Tympanic                 Isaish Alemu

## 2017-03-01 NOTE — Progress Notes (Signed)
Initial Nutrition Assessment  DOCUMENTATION CODES:   Severe malnutrition in context of acute illness/injury  INTERVENTION:   Recommend Premier Protein BID, each supplement provides 160 kcal and 30 grams of protein.   Recommend daily MVI   Recommend check Mg and P labs   NUTRITION DIAGNOSIS:   Severe Malnutrition related to acute illness as evidenced by 9 percent weight loss in 1 month, moderate fat depletion, moderate muscle depletion.  GOAL:   Patient will meet greater than or equal to 90% of their needs  MONITOR:   PO intake, Supplement acceptance, Weight trends, Labs, Skin, I & O's  REASON FOR ASSESSMENT:   Malnutrition Screening Tool    ASSESSMENT:   67 year old woman has had postprandial abdominal discomfort and loss of appetite. Upper endoscopy, lower endoscopy and diagnostic imaging has been notable only for a hernia in the medial aspect of her prior open cholecystectomy wound with the proximal transverse colon extending up to the lateral level of the heart on the lateral views.  Pt s/p diagnostic laparoscopy and repair of her ventral hernia today    RD familiar with this pt from previous admit. Pt with poor appetite and oral intake for the past 4-5 months r/t ventral hernia. Pt able to eat 25-50% of meals prior to her discharge in December. Per chart, pt has lost over 40lbs since October of last year. Most recently, pt has lost 15lbs(9%) over the past month; this is severe weight loss. Pt drinking Premier protein on last admit; recommend continue supplements to help pt meet her estimated protein needs. Also recommend MVI as pt with poor oral intake. Pt s/p hernia repair today. Recommend monitor K, Mg, and P labs as pt experienced severe refeeding on last admit and likely has not had a huge improvement in her oral intake given her continued weight loss.   Medications reviewed and include: protonix, paxil, LRS @50ml /hr  Labs reviewed: Na 138 wnl, K 4.1  wnl  Nutrition-Focused physical exam completed. Findings are moderate fat and muscle depletions over entire body, and no edema.   Diet Order:  Diet - low sodium heart healthy DIET SOFT Room service appropriate? Yes; Fluid consistency: Thin  EDUCATION NEEDS:   No education needs have been identified at this time  Skin:  Skin Assessment: (incision abdomen )  Last BM:  pta  Height:   Ht Readings from Last 1 Encounters:  02/26/17 5\' 5"  (1.651 m)    Weight:   Wt Readings from Last 1 Encounters:  02/26/17 156 lb (70.8 kg)    Ideal Body Weight:  56.8 kg  BMI:  There is no height or weight on file to calculate BMI.  Estimated Nutritional Needs:   Kcal:  1500-1700kcal/day   Protein:  78-92g/day   Fluid:  >1.5L/day   Koleen Distance MS, RD, LDN Pager #[phone] After Hours Pager: [phone]

## 2017-03-01 NOTE — Care Management Obs Status (Signed)
[place] NOTIFICATION   Patient Details  Name: Anne [name] MRN: [id number] Date of Birth: 1950-03-09   Medicare Observation Status Notification Given:  Yes    Beverly Sessions, RN 03/01/2017, 4:10 PM

## 2017-03-01 NOTE — Anesthesia Procedure Notes (Addendum)
Procedure Name: Intubation Date/Time: 03/01/2017 7:36 AM Performed by: Doreen Salvage, CRNA Pre-anesthesia Checklist: Patient identified, Patient being monitored, Timeout performed, Emergency Drugs available and Suction available Patient Re-evaluated:Patient Re-evaluated prior to induction Oxygen Delivery Method: Circle system utilized Preoxygenation: Pre-oxygenation with 100% oxygen Induction Type: IV induction Ventilation: Mask ventilation without difficulty Laryngoscope Size: Mac and 3 Grade View: Grade I Tube type: Oral Tube size: 7.0 mm Number of attempts: 2 Airway Equipment and Method: Stylet Placement Confirmation: ETT inserted through vocal cords under direct vision,  positive ETCO2 and breath sounds checked- equal and bilateral Secured at: 21 cm Tube secured with: Tape Dental Injury: Teeth and Oropharynx as per pre-operative assessment  Comments: Patient is a little anterior

## 2017-03-01 NOTE — Op Note (Signed)
Preoperative diagnosis: Ventral hernia.  Postoperative diagnosis: Same.  Operative procedure: Diagnostic laparoscopy, repair of speed Gaylene hernia at the prior open cholecystectomy site.  Operating Surgeon: Hervey Ard, MD.  Anesthesia: General endotracheal.  Estimated blood loss: Less than 5 cc.  Clinical note: This 67 year old woman has had postprandial abdominal discomfort and loss of appetite.  Upper endoscopy, lower endoscopy and diagnostic imaging is been notable only for a hernia in the medial aspect of her prior open cholecystectomy wound with the proximal transverse colon extending up to the lateral level of the heart on the lateral views.  She is felt to be a candidate for diagnostic laparoscopy and repair of her ventral hernia.  The patient received Kefzol prior to the procedure.  She received oral Celebrex and gabapentin.  Operative note: SCD stockings for DVT prevention.  After the induction of general endotracheal anesthesia the patient was placed in Trendelenburg position.  A Veress needle was placed through a trans-umbilical incision.  After assuring intra-abdominal location with a hanging drop test the abdomen was insufflated with CO2 of 10 mmHg pressure.  A 10 mm Step port was expanded.  Inspection showed no evidence of injury from initial port placement.  Inspection of the lower abdomen was unremarkable.  The right upper quadrant in the medial aspect of the left upper quadrant showed an extensive band of adhesions from her prior surgery.  An 11 mm XL port was placed into the left lateral mid abdomen and a 5 mm Step port was placed above this just below the costal margin.  Make use of the LigaSure device the adhesions were taken down.  The colon was visualized and protected.  The liver was noted to be markedly cirrhotic.  The hernia defect measured 4 x 7 cm.  After the colonic contents had been removed the scope was advanced up in the hernia sac with no obvious findings.   Inspection of the small bowel as permitted by the overlying omentum was unremarkable.  No peritoneal masses or free fluid.  It was elected to make use of a 10 x 14 cm Ventralight ST mesh with the Echo positioning device.  This was placed through the trans-umbilical incision and 4 corner sutures of 0 Maxon with fixated with trans-fascial the 4 corners of the mesh were fascial fixation with trans-fascial sutures of 0 Maxon wound completed in 4 quadrants.  The secure strap tach device was then used to fully anchor the mesh into position including into the overlying internal oblique muscle.  Inspection showed no evidence of injury of bowel injury.  The abdomen was desufflated.  The fascia at the umbilicus and the 11 mm port site laterally were closed with 2-0 Vicryl sutures.  Skin incisions were closed with 4-0 Vicryl subarticular sutures.  Benzoin Steri-Strips followed by Telfa and Tegaderm dressings were applied to the port sites.  Benzoin Steri-Strips applied to the trans-fascial suture sites.  Abdominal pad to the trans-fascial suture site.  The patient was extubated and taken to the recovery room in stable condition.

## 2017-03-01 NOTE — Anesthesia Preprocedure Evaluation (Signed)
Anesthesia Evaluation  Patient identified by MRN, date of birth, ID band Patient awake    Reviewed: Allergy & Precautions, NPO status , Patient's Chart, lab work & pertinent test results  History of Anesthesia Complications Negative for: history of anesthetic complications  Airway Mallampati: III  TM Distance: >3 FB Neck ROM: Full    Dental  (+) Partial Upper, Missing, Poor Dentition   Pulmonary neg sleep apnea, neg COPD, former smoker,    breath sounds clear to auscultation- rhonchi (-) wheezing      Cardiovascular hypertension, Pt. on medications (-) CAD, (-) Past MI, (-) Cardiac Stents and (-) CABG  Rhythm:Regular Rate:Normal - Systolic murmurs and - Diastolic murmurs    Neuro/Psych PSYCHIATRIC DISORDERS Depression negative neurological ROS     GI/Hepatic Neg liver ROS, GERD  ,  Endo/Other  negative endocrine ROSneg diabetes  Renal/GU negative Renal ROS     Musculoskeletal  (+) Arthritis ,   Abdominal (+) - obese,   Peds  Hematology  (+) anemia ,   Anesthesia Other Findings Past Medical History: No date: Arthritis No date: Depression No date: GERD (gastroesophageal reflux disease) No date: Hyperlipidemia No date: Hypertension   Reproductive/Obstetrics                             Anesthesia Physical Anesthesia Plan  ASA: II  Anesthesia Plan: General   Post-op Pain Management:    Induction: Intravenous  PONV Risk Score and Plan: 2 and Dexamethasone and Ondansetron  Airway Management Planned: Oral ETT  Additional Equipment:   Intra-op Plan:   Post-operative Plan: Extubation in OR  Informed Consent: I have reviewed the patients History and Physical, chart, labs and discussed the procedure including the risks, benefits and alternatives for the proposed anesthesia with the patient or authorized representative who has indicated his/her understanding and acceptance.    Dental advisory given  Plan Discussed with: CRNA and Anesthesiologist  Anesthesia Plan Comments:         Anesthesia Quick Evaluation

## 2017-03-01 NOTE — Discharge Instructions (Signed)

## 2017-03-01 NOTE — Anesthesia Post-op Follow-up Note (Signed)
Anesthesia QCDR form completed.        

## 2017-03-02 DIAGNOSIS — K432 Incisional hernia without obstruction or gangrene: Secondary | ICD-10-CM | POA: Diagnosis not present

## 2017-03-02 MED ORDER — HYDROCODONE-ACETAMINOPHEN 5-325 MG PO TABS: 1.0000 | ORAL_TABLET | ORAL | 0 refills | Status: DC | PRN

## 2017-03-02 NOTE — Final Progress Note (Signed)
AVSS.Feeling well. Minimal pain. Reports tolerating diet without nausea. Lungs: Clear. ABD: Scaphoid, soft. Dressings: Dry. Encouraged to use nutritional supplements at home. Follow up 1/31 as scheduled.

## 2017-03-04 NOTE — Telephone Encounter (Signed)
Yes.  She needs this rechecked.  Please get her set up for a lab check.  Thanks.

## 2017-03-04 NOTE — Telephone Encounter (Signed)
Does she need a lab appointment since she has already had the procedure?

## 2017-03-05 NOTE — Telephone Encounter (Signed)
scheduled

## 2017-03-07 ENCOUNTER — Other Ambulatory Visit (INDEPENDENT_AMBULATORY_CARE_PROVIDER_SITE_OTHER): Payer: PPO

## 2017-03-07 ENCOUNTER — Ambulatory Visit: Payer: PPO | Admitting: General Surgery

## 2017-03-07 ENCOUNTER — Ambulatory Visit (INDEPENDENT_AMBULATORY_CARE_PROVIDER_SITE_OTHER): Payer: PPO | Admitting: General Surgery

## 2017-03-07 ENCOUNTER — Other Ambulatory Visit: Payer: Self-pay | Admitting: Family Medicine

## 2017-03-07 VITALS — BP 140/80 | HR 97 | Resp 16 | Ht 62.0 in | Wt 155.0 lb

## 2017-03-07 DIAGNOSIS — E876 Hypokalemia: Secondary | ICD-10-CM

## 2017-03-07 DIAGNOSIS — K439 Ventral hernia without obstruction or gangrene: Secondary | ICD-10-CM

## 2017-03-07 LAB — BASIC METABOLIC PANEL
BUN: 14 mg/dL (ref 6–23)
CALCIUM: 9.4 mg/dL (ref 8.4–10.5)
CO2: 30 mEq/L (ref 19–32)
CREATININE: 0.73 mg/dL (ref 0.40–1.20)
Chloride: 100 mEq/L (ref 96–112)
GFR: 84.02 mL/min (ref >60.00)
GLUCOSE: 129 mg/dL — AB (ref 70–99)
Potassium: 3.6 mEq/L (ref 3.5–5.1)
Sodium: 138 mEq/L (ref 135–145)

## 2017-03-07 NOTE — Progress Notes (Signed)
Patient ID: Anne [name], female   DOB: 1950-02-26, 67 y.o.   MRN: [id number]  Chief Complaint  Patient presents with  . Routine Post Op    HPI Anne [name] is a 67 y.o. female here today for her post op ventral hernia repair done on 03/01/2017.Patient states she is having pain.Patient has not vomited since surgery. Daughter, Albina Billet is present at visit.  The patient was found to have a defect of the medial lateral suspect of her previous open cholecystectomy incision to contain the medial aspect of the transverse colon as suggested on CT.  No small bowel content.  There was evidence of cirrhosis of the liver.  The patient reports that she has had no vomiting since surgery.  Her appetite is ever so slowly getting better.  The importance of using nutritional supplements was discussed.  She asked about driving, this is discouraged until she is comfortable and confident with her strength. HPI  Past Medical History:  Diagnosis Date  . Arthritis   . Cirrhosis ([place])   . Depression   . GERD (gastroesophageal reflux disease)   . Hyperlipidemia   . Hypertension     Past Surgical History:  Procedure Laterality Date  . APPENDECTOMY  1971  . CHOLECYSTECTOMY  1971  . COLONOSCOPY WITH PROPOFOL N/A 11/15/2016   Procedure: COLONOSCOPY WITH PROPOFOL;  Surgeon: Jonathon Bellows, MD;  Location: [facility] ENDOSCOPY;  Service: Gastroenterology;  Laterality: N/A;  . ESOPHAGOGASTRODUODENOSCOPY (EGD) WITH PROPOFOL N/A 01/07/2017   Procedure: ESOPHAGOGASTRODUODENOSCOPY (EGD) WITH PROPOFOL;  Surgeon: Jonathon Bellows, MD;  Location: [facility] ENDOSCOPY;  Service: Gastroenterology;  Laterality: N/A;  . LAPAROSCOPY N/A 03/01/2017   Procedure: LAPAROSCOPY DIAGNOSTIC;  Surgeon: Robert Bellow, MD;  Location: [facility] ORS;  Service: General;  Laterality: N/A;  . TONSILECTOMY, ADENOIDECTOMY, BILATERAL MYRINGOTOMY [place]  . TONSILLECTOMY    . VENTRAL HERNIA REPAIR N/A 03/01/2017   Procedure: LAPAROSCOPIC VENTRAL HERNIA;   Surgeon: Robert Bellow, MD;  Location: [facility] ORS;  Service: General;  Laterality: N/A;  . VENTRAL HERNIA REPAIR N/A 03/01/2017   Procedure: HERNIA REPAIR VENTRAL ADULT;  Surgeon: Robert Bellow, MD;  Location: [facility] ORS;  Service: General;  Laterality: N/A;    Family History  Problem Relation Age of Onset  . Cancer Mother        ovarian  . Hypertension Mother   . Ovarian cancer Mother 58  . Heart disease Father   . Stroke Father   . Ovarian cancer Sister        ? dx cancer had hyst.  . Breast cancer Neg Hx     Social History Social History   Tobacco Use  . Smoking status: Former Smoker    Last attempt to quit: 02/07/2012    Years since quitting: 5.0  . Smokeless tobacco: Never Used  Substance Use Topics  . Alcohol use: No    Alcohol/week: 0.0 oz  . Drug use: No    Allergies  Allergen Reactions  . Bee Venom Swelling    Current Outpatient Medications  Medication Sig Dispense Refill  . cholecalciferol (VITAMIN D) 1000 units tablet Take 1,000 Units by mouth daily.    . cyclobenzaprine (FLEXERIL) 10 MG tablet Take 10 mg by mouth at bedtime as needed.     . folic acid (FOLVITE) 1 MG tablet Take 1 tablet (1 mg total) by mouth daily. 30 tablet 0  . HYDROcodone-acetaminophen (NORCO/VICODIN) 5-325 MG tablet Take 1 tablet by mouth every 4 (four) hours as needed  for moderate pain. 20 tablet 0  . HYDROcodone-acetaminophen (NORCO/VICODIN) 5-325 MG tablet Take 1 tablet by mouth every 4 (four) hours as needed for moderate pain. 20 tablet 0  . ibuprofen (ADVIL,MOTRIN) 200 MG tablet Take 400 mg by mouth every 8 (eight) hours as needed (for headaches).    . Multiple Vitamins-Minerals (EQ VISION FORMULA 50+ PO) Take 1 capsule by mouth daily.    Marland Kitchen omega-3 fish oil (MAXEPA) 1000 MG CAPS capsule Take 1,000 mg by mouth daily.    . ondansetron (ZOFRAN) 4 MG tablet Take 1 tablet (4 mg total) by mouth every 8 (eight) hours as needed for nausea or vomiting. 30 tablet 1  . oxybutynin  (DITROPAN-XL) 5 MG 24 hr tablet TAKE 1 TABLET BY MOUTH AT BEDTIME 90 tablet 1  . pantoprazole (PROTONIX) 40 MG tablet Take 1 tablet (40 mg total) by mouth 2 (two) times daily before a meal. (Patient taking differently: Take 40 mg by mouth at bedtime. ) 60 tablet 2  . PARoxetine (PAXIL) 20 MG tablet TAKE ONE TABLET BY MOUTH ONCE DAILY (Patient taking differently: TAKE ONE TABLET BY MOUTH ONCE DAILY AT NIGHT) 90 tablet 2  . potassium chloride (K-DUR) 10 MEQ tablet Take 2 tablets (20 mEq total) by mouth daily. Fill on 02/15/17. (Patient taking differently: Take 10 mEq by mouth daily. Fill on 02/15/17.) 30 tablet 1  . promethazine (PHENERGAN) 12.5 MG tablet Take 1 tablet (12.5 mg total) by mouth every 8 (eight) hours as needed for nausea or vomiting. 20 tablet 0  . ramipril (ALTACE) 2.5 MG capsule Take 1 capsule (2.5 mg total) by mouth daily. 90 capsule 3  . rosuvastatin (CRESTOR) 20 MG tablet Take 1 tablet (20 mg total) by mouth daily. (Patient taking differently: Take 20 mg by mouth at bedtime. ) 90 tablet 3   No current facility-administered medications for this visit.     Review of Systems Review of Systems  Constitutional: Negative.   Respiratory: Negative.   Cardiovascular: Negative.     Blood pressure 140/80, pulse 97, resp. rate 16, height 5\' 2"  (1.575 m), weight 155 lb (70.3 kg).  Physical Exam Physical Exam  Constitutional: She is oriented to person, place, and time. She appears well-developed and well-nourished.  Abdominal: Soft.  Port sites are clean and healing well.  Neurological: She is alert and oriented to person, place, and time.  Skin: Skin is warm and dry.      Assessment    Doing well post laparoscopic ventral hernia repair.    Plan  The patient is aware to use a heating pad as needed for comfort. Proper lifting techniques reviewed. Return in one month. The patient is aware to call back for any questions or concerns.  She is aware that she should make use of a  lower total daily dose of Tylenol than average based on her liver findings.  HPI, Physical Exam, Assessment and Plan have been scribed under the direction and in the presence of Hervey Ard, MD.  Gaspar Cola, CMA     I have completed the exam and reviewed the above documentation for accuracy and completeness.  I agree with the above.  Haematologist has been used and any errors in dictation or transcription are unintentional.  Hervey Ard, M.D., F.A.C.S.   Forest Gleason Jahna Liebert 03/07/2017, 10:20 AM

## 2017-03-07 NOTE — Patient Instructions (Addendum)
The patient is aware to use a heating pad as needed for comfort. Proper lifting techniques reviewed. Return in one month. The patient is aware to call back for any questions or concerns. Needs protein.

## 2017-03-10 NOTE — Discharge Summary (Signed)
Physician Discharge Summary  Patient ID: Anne [name] MRN: [id number] DOB/AGE: February 29, 1950 67 y.o.  Admit date: 03/01/2017 Discharge date: 03/10/2017  Admission Diagnoses:  Discharge Diagnoses:  Active Problems:   Ventral hernia   Discharged Condition: good  Hospital Course: Significant pain after surgery making overnight observation necessary   Consults: None  Significant Diagnostic Studies: None  Treatments: analgesia:    Discharge Exam: Blood pressure 115/79, pulse 91, temperature 98.2 F (36.8 C), temperature source Oral, resp. rate 16, SpO2 95 %. General appearance: alert and cooperative Resp: clear to auscultation bilaterally Cardio: regular rate and rhythm, S1, S2 normal, no murmur, click, rub or gallop GI: soft, non-tender; bowel sounds normal; no masses,  no organomegaly Incision/Wound: Port sites clean and dry.   Disposition: 01-Home or Self Care  Discharge Instructions    Diet - low sodium heart healthy   Complete by:  As directed    Diet - low sodium heart healthy   Complete by:  As directed    Discharge instructions   Complete by:  As directed    Heating pad to abdomen for comfort.  Tylenol: If needed for soreness.  Norco (hydrocodone): If needed for pain.  This medication may constipate.  Laxative of choice if needed.  Diet as tolerated.  You may shower starting tomorrow.  Outer plastic dressings may be removed in 3 days if desired.  No lifting over 10 pounds.  No driving until pain-free.   Discharge instructions   Complete by:  As directed    No lifting over 10 pounds.  No driving until pain free.  Diet as tolerated.  Tylenol: If needed for soreness.  Do not take more than 6 extra strength or 9 regular strength tablets daily.  Norco (hydrocodone): If needed for pain. This medication may constipate.  Laxative of choice if needed.  OK to shower at any time.   Increase activity slowly   Complete by:  As directed    Increase  activity slowly   Complete by:  As directed      Allergies as of 03/02/2017      Reactions   Bee Venom Swelling      Medication List    STOP taking these medications   [place] Wort 300 MG Caps     TAKE these medications   cholecalciferol 1000 units tablet Commonly known as:  VITAMIN D Take 1,000 Units by mouth daily.   cyclobenzaprine 10 MG tablet Commonly known as:  FLEXERIL Take 10 mg by mouth at bedtime as needed.   EQ VISION FORMULA 50+ PO Take 1 capsule by mouth daily.   folic acid 1 MG tablet Commonly known as:  FOLVITE Take 1 tablet (1 mg total) by mouth daily.   HYDROcodone-acetaminophen 5-325 MG tablet Commonly known as:  NORCO/VICODIN Take 1 tablet by mouth every 4 (four) hours as needed for moderate pain.   HYDROcodone-acetaminophen 5-325 MG tablet Commonly known as:  NORCO/VICODIN Take 1 tablet by mouth every 4 (four) hours as needed for moderate pain.   ibuprofen 200 MG tablet Commonly known as:  ADVIL,MOTRIN Take 400 mg by mouth every 8 (eight) hours as needed (for headaches).   omega-3 fish oil 1000 MG Caps capsule Commonly known as:  MAXEPA Take 1,000 mg by mouth daily.   ondansetron 4 MG tablet Commonly known as:  ZOFRAN Take 1 tablet (4 mg total) by mouth every 8 (eight) hours as needed for nausea or vomiting.   oxybutynin 5 MG 24 hr tablet  Commonly known as:  DITROPAN-XL TAKE 1 TABLET BY MOUTH AT BEDTIME   pantoprazole 40 MG tablet Commonly known as:  PROTONIX Take 1 tablet (40 mg total) by mouth 2 (two) times daily before a meal. What changed:  when to take this   PARoxetine 20 MG tablet Commonly known as:  PAXIL TAKE ONE TABLET BY MOUTH ONCE DAILY What changed:    how much to take  how to take this  when to take this   potassium chloride 10 MEQ tablet Commonly known as:  K-DUR Take 2 tablets (20 mEq total) by mouth daily. Fill on 02/15/17. What changed:    how much to take  additional instructions   promethazine 12.5  MG tablet Commonly known as:  PHENERGAN Take 1 tablet (12.5 mg total) by mouth every 8 (eight) hours as needed for nausea or vomiting.   ramipril 2.5 MG capsule Commonly known as:  ALTACE Take 1 capsule (2.5 mg total) by mouth daily.   rosuvastatin 20 MG tablet Commonly known as:  CRESTOR Take 1 tablet (20 mg total) by mouth daily. What changed:  when to take this      Follow-up Information     [facility]. Go on 03/07/2017.   Specialty:  General Surgery Why:  at 1:30 pm for post operative appointment Contact information: [place] [phone]          Signed: Robert Bellow 03/10/2017, 1:19 PM

## 2017-03-11 ENCOUNTER — Telehealth: Payer: Self-pay | Admitting: Gastroenterology

## 2017-03-11 NOTE — Telephone Encounter (Signed)
LVM for patient to call and reschedule. °

## 2017-03-13 ENCOUNTER — Encounter: Payer: Self-pay | Admitting: Gastroenterology

## 2017-03-13 ENCOUNTER — Telehealth: Payer: Self-pay | Admitting: Gastroenterology

## 2017-03-13 ENCOUNTER — Other Ambulatory Visit: Payer: Self-pay | Admitting: Ophthalmology

## 2017-03-13 DIAGNOSIS — H547 Unspecified visual loss: Secondary | ICD-10-CM | POA: Diagnosis not present

## 2017-03-13 NOTE — Telephone Encounter (Signed)
LVM for patient to call and reschedule. Letter sent °

## 2017-03-14 ENCOUNTER — Other Ambulatory Visit: Payer: PPO

## 2017-03-20 ENCOUNTER — Ambulatory Visit
Admission: RE | Admit: 2017-03-20 | Discharge: 2017-03-20 | Disposition: A | Discharge status: Home / Self Care | Payer: PPO | Source: Ambulatory Visit | Attending: Ophthalmology | Admitting: Ophthalmology

## 2017-03-20 DIAGNOSIS — H539 Unspecified visual disturbance: Secondary | ICD-10-CM | POA: Diagnosis not present

## 2017-03-20 DIAGNOSIS — H547 Unspecified visual loss: Secondary | ICD-10-CM | POA: Insufficient documentation

## 2017-03-20 MED ORDER — GADOBENATE DIMEGLUMINE 529 MG/ML IV SOLN
15.0000 mL | Freq: Once | INTRAVENOUS | Status: AC | PRN
  Administered 2017-03-20: 14 mL via INTRAVENOUS

## 2017-03-28 DIAGNOSIS — H547 Unspecified visual loss: Secondary | ICD-10-CM | POA: Diagnosis not present

## 2017-04-01 ENCOUNTER — Ambulatory Visit: Payer: PPO | Admitting: Family Medicine

## 2017-04-04 ENCOUNTER — Ambulatory Visit (INDEPENDENT_AMBULATORY_CARE_PROVIDER_SITE_OTHER): Payer: PPO | Admitting: General Surgery

## 2017-04-04 ENCOUNTER — Encounter: Payer: Self-pay | Admitting: General Surgery

## 2017-04-04 VITALS — BP 132/72 | HR 88 | Resp 14 | Ht 65.0 in | Wt 161.0 lb

## 2017-04-04 DIAGNOSIS — K439 Ventral hernia without obstruction or gangrene: Secondary | ICD-10-CM

## 2017-04-04 NOTE — Progress Notes (Signed)
Patient ID: Anne [name], female   DOB: Feb 05, 1951, 67 y.o.   MRN: [id number]  Chief Complaint  Patient presents with  . Routine Post Op    HPI Anne [name] is a 67 y.o. female.  Here for postoperative visit, ventral hernia on 03-01-17. She states she is doing well, occasionally using pain medication. Bowels area loose, which is her normal.  HPI  Past Medical History:  Diagnosis Date  . Arthritis   . Cirrhosis ([place])   . Depression   . GERD (gastroesophageal reflux disease)   . Hyperlipidemia   . Hypertension     Past Surgical History:  Procedure Laterality Date  . APPENDECTOMY  1971  . CHOLECYSTECTOMY  1971  . COLONOSCOPY WITH PROPOFOL N/A 11/15/2016   Procedure: COLONOSCOPY WITH PROPOFOL;  Surgeon: Jonathon Bellows, MD;  Location: [facility] ENDOSCOPY;  Service: Gastroenterology;  Laterality: N/A;  . ESOPHAGOGASTRODUODENOSCOPY (EGD) WITH PROPOFOL N/A 01/07/2017   Procedure: ESOPHAGOGASTRODUODENOSCOPY (EGD) WITH PROPOFOL;  Surgeon: Jonathon Bellows, MD;  Location: [facility] ENDOSCOPY;  Service: Gastroenterology;  Laterality: N/A;  . LAPAROSCOPY N/A 03/01/2017   Procedure: LAPAROSCOPY DIAGNOSTIC;  Surgeon: Robert Bellow, MD;  Location: [facility] ORS;  Service: General;  Laterality: N/A;  . TONSILECTOMY, ADENOIDECTOMY, BILATERAL MYRINGOTOMY [place]  . TONSILLECTOMY    . VENTRAL HERNIA REPAIR N/A 03/01/2017   Procedure: LAPAROSCOPIC VENTRAL HERNIA;  Surgeon: Robert Bellow, MD;  Location: [facility] ORS;  Service: General;  Laterality: N/A;  . VENTRAL HERNIA REPAIR N/A 03/01/2017   Procedure: HERNIA REPAIR VENTRAL ADULT;  Surgeon: Robert Bellow, MD;  Location: [facility] ORS;  Service: General;  Laterality: N/A;    Family History  Problem Relation Age of Onset  . Cancer Mother        ovarian  . Hypertension Mother   . Ovarian cancer Mother 32  . Heart disease Father   . Stroke Father   . Ovarian cancer Sister        ? dx cancer had hyst.  . Breast cancer Neg Hx     Social  History Social History   Tobacco Use  . Smoking status: Former Smoker    Last attempt to quit: 02/07/2012    Years since quitting: 5.1  . Smokeless tobacco: Never Used  Substance Use Topics  . Alcohol use: No    Alcohol/week: 0.0 oz  . Drug use: No    Allergies  Allergen Reactions  . Bee Venom Swelling    Current Outpatient Medications  Medication Sig Dispense Refill  . cholecalciferol (VITAMIN D) 1000 units tablet Take 1,000 Units by mouth daily.    . cyclobenzaprine (FLEXERIL) 10 MG tablet Take 10 mg by mouth at bedtime as needed.     . folic acid (FOLVITE) 1 MG tablet Take 1 tablet (1 mg total) by mouth daily. 30 tablet 0  . HYDROcodone-acetaminophen (NORCO/VICODIN) 5-325 MG tablet Take 1 tablet by mouth every 4 (four) hours as needed for moderate pain. 20 tablet 0  . ibuprofen (ADVIL,MOTRIN) 200 MG tablet Take 400 mg by mouth every 8 (eight) hours as needed (for headaches).    . Multiple Vitamins-Minerals (EQ VISION FORMULA 50+ PO) Take 1 capsule by mouth daily.    Marland Kitchen omega-3 fish oil (MAXEPA) 1000 MG CAPS capsule Take 1,000 mg by mouth daily.    . ondansetron (ZOFRAN) 4 MG tablet Take 1 tablet (4 mg total) by mouth every 8 (eight) hours as needed for nausea or vomiting. 30 tablet 1  . oxybutynin (DITROPAN-XL)  5 MG 24 hr tablet TAKE 1 TABLET BY MOUTH AT BEDTIME 90 tablet 1  . pantoprazole (PROTONIX) 40 MG tablet Take 1 tablet (40 mg total) by mouth 2 (two) times daily before a meal. (Patient taking differently: Take 40 mg by mouth at bedtime. ) 60 tablet 2  . PARoxetine (PAXIL) 20 MG tablet TAKE ONE TABLET BY MOUTH ONCE DAILY (Patient taking differently: TAKE ONE TABLET BY MOUTH ONCE DAILY AT NIGHT) 90 tablet 2  . potassium chloride (K-DUR) 10 MEQ tablet Take 2 tablets (20 mEq total) by mouth daily. Fill on 02/15/17. (Patient taking differently: Take 10 mEq by mouth daily. Fill on 02/15/17.) 30 tablet 1  . promethazine (PHENERGAN) 12.5 MG tablet Take 1 tablet (12.5 mg total) by mouth  every 8 (eight) hours as needed for nausea or vomiting. 20 tablet 0  . ramipril (ALTACE) 2.5 MG capsule Take 1 capsule (2.5 mg total) by mouth daily. 90 capsule 3  . rosuvastatin (CRESTOR) 20 MG tablet Take 1 tablet (20 mg total) by mouth daily. (Patient taking differently: Take 20 mg by mouth at bedtime. ) 90 tablet 3   No current facility-administered medications for this visit.     Review of Systems Review of Systems  Constitutional: Negative.   Respiratory: Negative.   Cardiovascular: Negative.   Gastrointestinal: Negative for constipation and diarrhea.    Blood pressure 132/72, pulse 88, resp. rate 14, height 5\' 5"  (1.651 m), weight 161 lb (73 kg), SpO2 97 %.  Physical Exam Physical Exam  Constitutional: She is oriented to person, place, and time. She appears well-developed and well-nourished.  Abdominal: Soft. There is no tenderness.    Neurological: She is alert and oriented to person, place, and time.  Skin: Skin is warm and dry.  Psychiatric: Her behavior is normal.     Assessment    Doing well post laparoscopic ventral hernia repair.    Plan    Proper lifting techniques reviewed. Resume activities as tolerated. Follow  up as needed.    HPI, Physical Exam, Assessment and Plan have been scribed under the direction and in the presence of Robert Bellow, MD. Karie Fetch, RN   I have completed the exam and reviewed the above documentation for accuracy and completeness.  I agree with the above.  Haematologist has been used and any errors in dictation or transcription are unintentional.  Hervey Ard, M.D., F.A.C.S.  Forest Gleason Zorah Backes 04/04/2017, 1:10 PM

## 2017-04-04 NOTE — Patient Instructions (Addendum)
The patient is aware to call back for any questions or new concerns. Proper lifting techniques reviewed. Resume activities as tolerated. Follow  up as needed.

## 2017-05-06 ENCOUNTER — Telehealth: Payer: Self-pay | Admitting: Family Medicine

## 2017-05-06 ENCOUNTER — Ambulatory Visit (INDEPENDENT_AMBULATORY_CARE_PROVIDER_SITE_OTHER): Payer: PPO | Admitting: Family Medicine

## 2017-05-06 ENCOUNTER — Other Ambulatory Visit: Payer: Self-pay

## 2017-05-06 ENCOUNTER — Encounter: Payer: Self-pay | Admitting: Family Medicine

## 2017-05-06 VITALS — BP 146/84 | HR 81 | Temp 98.3°F | Wt 169.8 lb

## 2017-05-06 DIAGNOSIS — E785 Hyperlipidemia, unspecified: Secondary | ICD-10-CM | POA: Diagnosis not present

## 2017-05-06 DIAGNOSIS — F331 Major depressive disorder, recurrent, moderate: Secondary | ICD-10-CM | POA: Diagnosis not present

## 2017-05-06 DIAGNOSIS — I10 Essential (primary) hypertension: Secondary | ICD-10-CM

## 2017-05-06 DIAGNOSIS — R634 Abnormal weight loss: Secondary | ICD-10-CM | POA: Diagnosis not present

## 2017-05-06 DIAGNOSIS — J069 Acute upper respiratory infection, unspecified: Secondary | ICD-10-CM | POA: Diagnosis not present

## 2017-05-06 DIAGNOSIS — K439 Ventral hernia without obstruction or gangrene: Secondary | ICD-10-CM

## 2017-05-06 LAB — COMPREHENSIVE METABOLIC PANEL
ALBUMIN: 3.5 g/dL (ref 3.5–5.2)
ALT: 15 U/L (ref 0–35)
AST: 23 U/L (ref 0–37)
Alkaline Phosphatase: 64 U/L (ref 39–117)
BILIRUBIN TOTAL: 0.4 mg/dL (ref 0.2–1.2)
BUN: 14 mg/dL (ref 6–23)
CHLORIDE: 104 meq/L (ref 96–112)
CO2: 31 meq/L (ref 19–32)
CREATININE: 0.75 mg/dL (ref 0.40–1.20)
Calcium: 9.1 mg/dL (ref 8.4–10.5)
GFR: 81.4 mL/min (ref >60.00)
Glucose, Bld: 94 mg/dL (ref 70–99)
Potassium: 4 mEq/L (ref 3.5–5.1)
SODIUM: 141 meq/L (ref 135–145)
Total Protein: 7.1 g/dL (ref 6.0–8.3)

## 2017-05-06 LAB — LIPID PANEL
CHOL/HDL RATIO: 3
CHOLESTEROL: 185 mg/dL (ref 0–200)
HDL: 54.9 mg/dL (ref >39.00)
LDL CALC: 106 mg/dL — AB (ref 0–99)
NonHDL: 130.07
Triglycerides: 120 mg/dL (ref 0.0–149.0)
VLDL: 24 mg/dL (ref 0.0–40.0)

## 2017-05-06 MED ORDER — ESCITALOPRAM OXALATE 10 MG PO TABS: 10.0000 mg | ORAL_TABLET | Freq: Every day | ORAL | 3 refills | Status: DC

## 2017-05-06 NOTE — Assessment & Plan Note (Signed)
Symptoms are consistent with viral URI.  She will monitor and if not improving in the next week follow-up with [place].

## 2017-05-06 NOTE — Assessment & Plan Note (Signed)
Has had more issues with this since coming off Paxil.  No SI.  Discussed changing to Lexapro given that she did not think the Paxil was beneficial.  She would like to do this and we will see her back in 3 months.  Given return precautions.

## 2017-05-06 NOTE — Assessment & Plan Note (Signed)
Above goal.  She is off medication currently.  We will check a CMP and then likely place her back on a ramipril.

## 2017-05-06 NOTE — Assessment & Plan Note (Signed)
Weight has improved after hernia repair.  I suspect there was some issue with bowel getting trapped intermittently causing her nausea which led to her weight loss.  She notes those symptoms have improved significantly.  We will continue to monitor her weight.

## 2017-05-06 NOTE — Progress Notes (Signed)
Tommi Rumps, MD Phone: [phone]  Anne [name] is a 67 y.o. female who presents today for f/u.  HYPERTENSION  Disease Monitoring  Home BP Monitoring [id number] systolically Chest pain- no    Dyspnea- no Medications  Compliance-  Stopped her meds 2 months ago.  Edema- no She also stopped her cholesterol medication.  Depression: No she has days where she just cries.  She came off Paxil about 2 months ago.  Depression worsened about a month ago.  Just feels depressed.  No anxiety.  No SI.  She is interested in going back on medication though she does not think the Paxil is helpful.  She underwent ventral hernia repair and notes her appetite has improved significantly.  Her nausea has resolved.  Her weight has started to increase.  She notes since the repair she has had a odd sensation in her abdomen and feels as though there is a hollow area.  Occasionally has some discomfort.  Does have bowel movements daily with no blood.  They are runny which is normal for her.  Describes the discomfort as somebody poking her in the right upper quadrant area.  Lasts for about 10 minutes and goes away on its own.  No exacerbating factors.  She reports a couple of days of nasal and sinus congestion with postnasal drip.  Small amount of cough.  No fevers or shortness of breath.  Social History   Tobacco Use  Smoking Status Former Smoker  . Last attempt to quit: 02/07/2012  . Years since quitting: 5.2  Smokeless Tobacco Never Used     ROS see history of present illness  Objective  Physical Exam Vitals:   05/06/17 0916  BP: (!) 146/84  Pulse: 81  Temp: 98.3 F (36.8 C)  SpO2: 96%    BP Readings from Last 3 Encounters:  05/06/17 (!) 146/84  04/04/17 132/72  03/07/17 140/80   Wt Readings from Last 3 Encounters:  05/06/17 169 lb 12.8 oz (77 kg)  04/04/17 161 lb (73 kg)  03/07/17 155 lb (70.3 kg)    Physical Exam  Constitutional: No distress.  HENT:  Head: Normocephalic and  atraumatic.  Mouth/Throat: Oropharynx is clear and moist. No oropharyngeal exudate.  Eyes: Pupils are equal, round, and reactive to light. Conjunctivae are normal.  Cardiovascular: Normal rate, regular rhythm and normal heart sounds.  Pulmonary/Chest: Effort normal and breath sounds normal.  Abdominal: Soft. Bowel sounds are normal. She exhibits no distension.  Very slight tenderness on palpation in the right upper quadrant, no rebound or guarding  Musculoskeletal: She exhibits no edema.  Neurological: She is alert. Gait normal.  Skin: Skin is warm and dry. She is not diaphoretic.     Assessment/Plan: Please see individual problem list.  Benign essential HTN Above goal.  She is off medication currently.  We will check a CMP and then likely place her back on a ramipril.  Upper respiratory infection Symptoms are consistent with viral URI.  She will monitor and if not improving in the next week follow-up with [place].  Weight loss Weight has improved after hernia repair.  I suspect there was some issue with bowel getting trapped intermittently causing her nausea which led to her weight loss.  She notes those symptoms have improved significantly.  We will continue to monitor her weight.  Ventral hernia Status post repair.  She is done relatively well since surgery though does note having an odd sensation with a hollow feeling and occasional poking type pain in  her right upper quadrant.  This may be postsurgical healing or related to the mesh.  We will send a message to her surgeon to get his input.  Discussed return precautions with the patient.  HLD (hyperlipidemia) We will check a lipid panel.  She will likely need to go back on a statin.  Depression Has had more issues with this since coming off Paxil.  No SI.  Discussed changing to Lexapro given that she did not think the Paxil was beneficial.  She would like to do this and we will see her back in 3 months.  Given return  precautions.  Orders Placed This Encounter  Procedures  . Comp Met (CMET)  . Lipid panel    Meds ordered this encounter  Medications  . escitalopram (LEXAPRO) 10 MG tablet    Sig: Take 1 tablet (10 mg total) by mouth daily.    Dispense:  30 tablet    Refill:  [facility], MD [place]

## 2017-05-06 NOTE — Assessment & Plan Note (Signed)
Status post repair.  She is done relatively well since surgery though does note having an odd sensation with a hollow feeling and occasional poking type pain in her right upper quadrant.  This may be postsurgical healing or related to the mesh.  We will send a message to her surgeon to get his input.  Discussed return precautions with the patient.

## 2017-05-06 NOTE — Assessment & Plan Note (Signed)
We will check a lipid panel.  She will likely need to go back on a statin.

## 2017-05-06 NOTE — Telephone Encounter (Signed)
Please let the patient know that I heard back from her surgeon.  He felt like her discomfort was likely due to healing and that the hollow sensation may be related to a small pocket of fluid covered by the mesh that can be uncomfortable.  He advised local heat, over-the-counter medications, and time as treatment.  These things should improve with time and if not she should let us know.  Thanks.

## 2017-05-06 NOTE — Telephone Encounter (Signed)
-----   Message from Robert Bellow, MD sent at 05/06/2017 10:41 AM EDT ----- Exactly correct. There is likely a little fluid pocket covered by the mesh that can be uncomfortable. Local heat, time and OTC meds are correct treatment.  ----- Message ----- From: Leone Haven, MD Sent: 05/06/2017   9:57 AM To: Robert Bellow, MD  Hey Dr Bary Castilla,   I saw Anne [name] in follow-up today. Thanks for repairing her hernia. She seems to have done quite well following this and her weight has trended up. She did mention a small amount of intermittent RUQ discomfort described as someone poking her lasting for 10 minutes at a time. It is not precipitated by anything and is not alleviated by anything. She also reports a "hollow" sensation in the area of the repair. I discussed with her that this could be continued healing and the hollow sensation may be because the contents of the hernia are no longer there. I wanted to see what you thought about these symptoms. Thanks for your help.  Randall Hiss

## 2017-05-06 NOTE — Patient Instructions (Signed)
Nice to see you. Your upper respiratory symptoms are likely related to a virus.  If they do not start to improve over the next week please let us know. We will start on Lexapro for your depression.  If you develop thoughts of harming yourself please go to the emergency department. We will get lab work today and contact you with the results. We will send a message to your surgeon to let him know about some of your symptoms.  If you do not hear anything in the next week or so you can call us or call them.

## 2017-05-07 NOTE — Telephone Encounter (Signed)
Tried calling, no voicemail. [place] for pec to speak to patient and give message below

## 2017-05-13 ENCOUNTER — Encounter: Payer: Self-pay | Admitting: Family Medicine

## 2017-05-13 DIAGNOSIS — E785 Hyperlipidemia, unspecified: Secondary | ICD-10-CM

## 2017-05-14 NOTE — Telephone Encounter (Addendum)
Spoke with pt. re: note/ recommendations given from surgeon to Dr. Caryl Bis.  Advised need to apply local heat, OTC medications for discomfort, and give more time to heal.  Verbalized understanding.      Scheduled the repeat lab work for Hepatic Panel and LDL Cholesterol recheck in 4-6 weeks, as recommended per Dr. Caryl Bis.

## 2017-05-14 NOTE — Telephone Encounter (Signed)
Please contact the patient to get her set up for a repeat LDL check in 4-6 weeks. Thanks.

## 2017-05-14 NOTE — Telephone Encounter (Signed)
Spoke with pt. Re: recommendations per Dr. Caryl Bis pt's surgeon.  Advised to apply local heat, OTC medications for discomfort, and give it time to heal.  Verb. Understanding.

## 2017-05-14 NOTE — Telephone Encounter (Signed)
Left message to return call, ok for pec to speak to patient to notify of message below and also schedule patient for 4-6 week lab appointment fasting

## 2017-05-14 NOTE — Telephone Encounter (Signed)
Tried calling, left voicemail

## 2017-05-15 ENCOUNTER — Ambulatory Visit: Payer: PPO | Admitting: Gastroenterology

## 2017-06-04 ENCOUNTER — Ambulatory Visit (INDEPENDENT_AMBULATORY_CARE_PROVIDER_SITE_OTHER): Payer: PPO

## 2017-06-04 VITALS — BP 108/68 | HR 70 | Temp 98.5°F | Resp 15 | Ht 65.0 in | Wt 174.0 lb

## 2017-06-04 DIAGNOSIS — Z0000 Encounter for general adult medical examination without abnormal findings: Secondary | ICD-10-CM

## 2017-06-04 NOTE — Patient Instructions (Addendum)
  Ms. Anne [name] , Thank you for taking time to come for your Medicare Wellness Visit. I appreciate your ongoing commitment to your health goals. Please review the following plan we discussed and let me know if I can assist you in the future.   These are the goals we discussed: Goals    . Increase physical activity       This is a list of the screening recommended for you and due dates:  Health Maintenance  Topic Date Due  . Mammogram  05/07/2018*  . Flu Shot  09/05/2017  . Tetanus Vaccine  02/18/2019  . Colon Cancer Screening  11/15/2021  . DEXA scan (bone density measurement)  Completed  .  Hepatitis C: One time screening is recommended by Center for Disease Control  (CDC) for  adults born from [birth year] through 1967.   Completed  . Pneumonia vaccines  Completed  *Topic was postponed. The date shown is not the original due date.

## 2017-06-04 NOTE — Progress Notes (Signed)
Subjective:   Anne [name] is a 67 y.o. female who presents for Medicare Annual (Subsequent) preventive examination.  Review of Systems:  No ROS.  Medicare Wellness Visit. Additional risk factors are reflected in the social history. Cardiac Risk Factors include: hypertension     Objective:     Vitals: BP 108/68 (BP Location: Left Arm, Patient Position: Sitting, Cuff Size: Normal)   Pulse 70   Temp 98.5 F (36.9 C) (Oral)   Resp 15   Ht 5\' 5"  (1.651 m)   Wt 174 lb (78.9 kg)   SpO2 97%   BMI 28.96 kg/m   Body mass index is 28.96 kg/m.  Advanced Directives 06/04/2017 03/01/2017 02/25/2017 01/07/2017 01/03/2017 01/03/2017 12/28/2016  Does Patient Have a Medical Advance Directive? No No No No - No No  Would patient like information on creating a medical advance directive? No - Patient declined No - Patient declined No - Patient declined - No - Patient declined No - Patient declined No - Patient declined    Tobacco Social History   Tobacco Use  Smoking Status Former Smoker  . Last attempt to quit: 02/07/2012  . Years since quitting: 5.3  Smokeless Tobacco Never Used     Counseling given: Not Answered   Clinical Intake:  Pre-visit preparation completed: Yes  Pain : No/denies pain     Nutritional Status: BMI 25 -29 Overweight Diabetes: No  How often do you need to have someone help you when you read instructions, pamphlets, or other written materials from your doctor or pharmacy?: 1 - Never  Interpreter Needed?: No     Past Medical History:  Diagnosis Date  . Arthritis   . Cirrhosis ([place])   . Depression   . GERD (gastroesophageal reflux disease)   . Hyperlipidemia   . Hypertension    Past Surgical History:  Procedure Laterality Date  . APPENDECTOMY  1971  . CHOLECYSTECTOMY  1971  . COLONOSCOPY WITH PROPOFOL N/A 11/15/2016   Procedure: COLONOSCOPY WITH PROPOFOL;  Surgeon: Jonathon Bellows, MD;  Location: [facility] ENDOSCOPY;  Service: Gastroenterology;   Laterality: N/A;  . ESOPHAGOGASTRODUODENOSCOPY (EGD) WITH PROPOFOL N/A 01/07/2017   Procedure: ESOPHAGOGASTRODUODENOSCOPY (EGD) WITH PROPOFOL;  Surgeon: Jonathon Bellows, MD;  Location: [facility] ENDOSCOPY;  Service: Gastroenterology;  Laterality: N/A;  . LAPAROSCOPY N/A 03/01/2017   Procedure: LAPAROSCOPY DIAGNOSTIC;  Surgeon: Robert Bellow, MD;  Location: [facility] ORS;  Service: General;  Laterality: N/A;  . TONSILECTOMY, ADENOIDECTOMY, BILATERAL MYRINGOTOMY [place]  . TONSILLECTOMY    . VENTRAL HERNIA REPAIR N/A 03/01/2017   10 x 14 CM Ventralight ST mesh, intraperitoneal location.   . VENTRAL HERNIA REPAIR N/A 03/01/2017   Procedure: HERNIA REPAIR VENTRAL ADULT;  Surgeon: Robert Bellow, MD;  Location: [facility] ORS;  Service: General;  Laterality: N/A;   Family History  Problem Relation Age of Onset  . Cancer Mother        ovarian  . Hypertension Mother   . Ovarian cancer Mother 32  . Heart disease Father   . Stroke Father   . Ovarian cancer Sister        ? dx cancer had hyst.  . Breast cancer Neg Hx    Social History   Socioeconomic History  . Marital status: Married    Spouse name: Not on file  . Number of children: Not on file  . Years of education: Not on file  . Highest education level: Not on file  Occupational History  . Not on  file  Social Needs  . Financial resource strain: Not hard at all  . Food insecurity:    Worry: Never true    Inability: Never true  . Transportation needs:    Medical: No    Non-medical: No  Tobacco Use  . Smoking status: Former Smoker    Last attempt to quit: 02/07/2012    Years since quitting: 5.3  . Smokeless tobacco: Never Used  Substance and Sexual Activity  . Alcohol use: No    Alcohol/week: 0.0 oz  . Drug use: No  . Sexual activity: Yes  Lifestyle  . Physical activity:    Days per week: Not on file    Minutes per session: Not on file  . Stress: Not at all  Relationships  . Social connections:    Talks on phone: Not on file      Gets together: Not on file    Attends religious service: Not on file    Active member of club or organization: Not on file    Attends meetings of clubs or organizations: Not on file    Relationship status: Not on file  Other Topics Concern  . Not on file  Social History Narrative   Married   Retired   Clinical cytogeneticist level of education    No children    1 cup of coffee    Outpatient Encounter Medications as of 06/04/2017  Medication Sig  . escitalopram (LEXAPRO) 10 MG tablet Take 1 tablet (10 mg total) by mouth daily.  Marland Kitchen ibuprofen (ADVIL,MOTRIN) 200 MG tablet Take 400 mg by mouth every 8 (eight) hours as needed (for headaches).  . rosuvastatin (CRESTOR) 20 MG tablet Take 1 tablet (20 mg total) by mouth daily.  . [DISCONTINUED] cyclobenzaprine (FLEXERIL) 10 MG tablet Take 10 mg by mouth at bedtime as needed.   . [DISCONTINUED] folic acid (FOLVITE) 1 MG tablet Take 1 tablet (1 mg total) by mouth daily. (Patient not taking: Reported on 05/06/2017)  . [DISCONTINUED] HYDROcodone-acetaminophen (NORCO/VICODIN) 5-325 MG tablet Take 1 tablet by mouth every 4 (four) hours as needed for moderate pain. (Patient not taking: Reported on 05/06/2017)  . [DISCONTINUED] Multiple Vitamins-Minerals (EQ VISION FORMULA 50+ PO) Take 1 capsule by mouth daily.  . [DISCONTINUED] omega-3 fish oil (MAXEPA) 1000 MG CAPS capsule Take 1,000 mg by mouth daily.  . [DISCONTINUED] ondansetron (ZOFRAN) 4 MG tablet Take 1 tablet (4 mg total) by mouth every 8 (eight) hours as needed for nausea or vomiting. (Patient not taking: Reported on 05/06/2017)  . [DISCONTINUED] oxybutynin (DITROPAN-XL) 5 MG 24 hr tablet TAKE 1 TABLET BY MOUTH AT BEDTIME (Patient not taking: Reported on 05/06/2017)  . [DISCONTINUED] pantoprazole (PROTONIX) 40 MG tablet Take 1 tablet (40 mg total) by mouth 2 (two) times daily before a meal. (Patient not taking: Reported on 05/06/2017)  . [DISCONTINUED] PARoxetine (PAXIL) 20 MG tablet TAKE ONE TABLET BY  MOUTH ONCE DAILY (Patient not taking: Reported on 05/06/2017)  . [DISCONTINUED] potassium chloride (K-DUR) 10 MEQ tablet Take 2 tablets (20 mEq total) by mouth daily. Fill on 02/15/17. (Patient not taking: Reported on 05/06/2017)  . [DISCONTINUED] promethazine (PHENERGAN) 12.5 MG tablet Take 1 tablet (12.5 mg total) by mouth every 8 (eight) hours as needed for nausea or vomiting. (Patient not taking: Reported on 05/06/2017)  . [DISCONTINUED] ramipril (ALTACE) 2.5 MG capsule Take 1 capsule (2.5 mg total) by mouth daily. (Patient not taking: Reported on 05/06/2017)   No facility-administered encounter medications on file as of 06/04/2017.  Activities of Daily Living In your present state of health, do you have any difficulty performing the following activities: 06/04/2017 03/01/2017  Hearing? N N  Vision? N N  Difficulty concentrating or making decisions? Y N  Walking or climbing stairs? Y N  Comment Unsteady gait -  Dressing or bathing? N N  Doing errands, shopping? N N  Preparing Food and eating ? N -  Using the Toilet? N -  In the past six months, have you accidently leaked urine? N -  Do you have problems with loss of bowel control? N -  Managing your Medications? N -  Managing your Finances? N -  Housekeeping or managing your Housekeeping? N -  Some recent data might be hidden    Patient Care Team: Leone Haven, MD as PCP - General (Family Medicine) Caryl Bis Angela Adam, MD as Consulting Physician (Family Medicine) Bary Castilla Forest Gleason, MD (General Surgery)    Assessment:   This is a routine wellness examination for [place].  The goal of the wellness visit is to assist the patient how to close the gaps in care and create a preventative care plan for the patient.   The roster of all physicians providing medical care to patient is listed in the Snapshot section of the chart.  Osteoporosis risk reviewed.    Safety issues reviewed; Smoke and carbon monoxide detectors in the home. No  firearms or firearms locked in a safe within the home. Wears seatbelts when driving or riding with others. No violence in the home.  They do not have excessive sun exposure.  Discussed the need for sun protection: hats, long sleeves and the use of sunscreen if there is significant sun exposure.  Patient is alert, normal appearance, oriented to person/place/and time.  Correctly identified the president of the [place] and recalls of 0/3 words. Performs simple calculations and can read correct time from watch face. Displays appropriate judgement.  No new identified risk were noted.  No failures at ADL's or IADL's.    BMI- discussed the importance of a healthy diet, water intake and the benefits of aerobic exercise. Educational material provided.   24 hour diet recall: Regular diet  Dental- 2 partials  Eye- Visual acuity not assessed per patient preference since they have regular follow up with the ophthalmologist.  Wears corrective lenses.  Sleep patterns- Sleeps through the night without issues.  Health maintenance gaps- closed.  Patient Concerns: None at this time. Follow up with PCP as needed.  Exercise Activities and Dietary recommendations Current Exercise Habits: The patient does not participate in regular exercise at present  Goals    . Increase physical activity       Fall Risk Fall Risk  06/04/2017 06/01/2016 02/21/2016  Falls in the past year? No No No   Depression Screen PHQ 2/9 Scores 06/04/2017 09/12/2016 06/01/2016 02/21/2016  PHQ - 2 Score 0 2 0 0  PHQ- 9 Score - 7 0 -     Cognitive Function MMSE - Mini Mental State Exam 06/04/2017  Orientation to time 5  Orientation to Place 5  Registration 3  Attention/ Calculation 5  Recall 0  Language- name 2 objects 2  Language- repeat 1  Language- follow 3 step command 3  Language- read & follow direction 1  Write a sentence 1  Copy design 1  Total score 27     6CIT Screen 06/01/2016  What Year? 0 points  What  month? 0 points  What time? 0 points  Count back from 20 0 points  Months in reverse 0 points  Repeat phrase 0 points  Total Score 0    Immunization History  Administered Date(s) Administered  . Influenza-Unspecified 10/15/2014, 10/29/2015, 11/01/2016  . Pneumococcal Conjugate-13 11/13/2013  . Pneumococcal Polysaccharide-23 11/30/2014  . Zoster 11/15/2014   Screening Tests Health Maintenance  Topic Date Due  . MAMMOGRAM  05/07/2018 (Originally 03/08/2017)  . INFLUENZA VACCINE  09/05/2017  . TETANUS/TDAP  02/18/2019  . COLONOSCOPY  11/15/2021  . DEXA SCAN  Completed  . Hepatitis C Screening  Completed  . PNA vac Low Risk Adult  Completed       Plan:   End of life planning; Advanced aging; Advanced directives discussed.  No HCPOA/Living Will.  Additional information declined at this time.  I have personally reviewed and noted the following in the patient's chart:   . Medical and social history . Use of alcohol, tobacco or illicit drugs  . Current medications and supplements . Functional ability and status . Nutritional status . Physical activity . Advanced directives . List of other physicians . Hospitalizations, surgeries, and ER visits in previous 12 months . Vitals . Screenings to include cognitive, depression, and falls . Referrals and appointments  In addition, I have reviewed and discussed with patient certain preventive protocols, quality metrics, and best practice recommendations. A written personalized care plan for preventive services as well as general preventive health recommendations were provided to patient.     Varney Biles, LPN  [id number]

## 2017-06-20 ENCOUNTER — Other Ambulatory Visit (INDEPENDENT_AMBULATORY_CARE_PROVIDER_SITE_OTHER): Payer: PPO

## 2017-06-20 DIAGNOSIS — E785 Hyperlipidemia, unspecified: Secondary | ICD-10-CM | POA: Diagnosis not present

## 2017-06-20 DIAGNOSIS — E876 Hypokalemia: Secondary | ICD-10-CM | POA: Diagnosis not present

## 2017-06-20 LAB — HEPATIC FUNCTION PANEL
ALBUMIN: 4.1 g/dL (ref 3.5–5.2)
ALT: 16 U/L (ref 0–35)
AST: 22 U/L (ref 0–37)
Alkaline Phosphatase: 69 U/L (ref 39–117)
Bilirubin, Direct: 0.1 mg/dL (ref 0.0–0.3)
Total Bilirubin: 0.5 mg/dL (ref 0.2–1.2)
Total Protein: 7.5 g/dL (ref 6.0–8.3)

## 2017-06-20 LAB — LDL CHOLESTEROL, DIRECT: Direct LDL: 78 mg/dL

## 2017-06-20 LAB — POTASSIUM: POTASSIUM: 4.2 meq/L (ref 3.5–5.1)

## 2017-07-31 DIAGNOSIS — H353131 Nonexudative age-related macular degeneration, bilateral, early dry stage: Secondary | ICD-10-CM | POA: Diagnosis not present

## 2017-08-05 ENCOUNTER — Encounter: Payer: Self-pay | Admitting: Family Medicine

## 2017-08-05 ENCOUNTER — Ambulatory Visit (INDEPENDENT_AMBULATORY_CARE_PROVIDER_SITE_OTHER): Payer: PPO | Admitting: Family Medicine

## 2017-08-05 DIAGNOSIS — K439 Ventral hernia without obstruction or gangrene: Secondary | ICD-10-CM | POA: Diagnosis not present

## 2017-08-05 DIAGNOSIS — I10 Essential (primary) hypertension: Secondary | ICD-10-CM | POA: Diagnosis not present

## 2017-08-05 DIAGNOSIS — E669 Obesity, unspecified: Secondary | ICD-10-CM | POA: Insufficient documentation

## 2017-08-05 DIAGNOSIS — F331 Major depressive disorder, recurrent, moderate: Secondary | ICD-10-CM

## 2017-08-05 DIAGNOSIS — E663 Overweight: Secondary | ICD-10-CM | POA: Insufficient documentation

## 2017-08-05 DIAGNOSIS — E66811 Obesity, class 1: Secondary | ICD-10-CM

## 2017-08-05 MED ORDER — BUPROPION HCL ER (XL) 150 MG PO TB24: 150.0000 mg | ORAL_TABLET | Freq: Every day | ORAL | 2 refills | Status: DC

## 2017-08-05 NOTE — Assessment & Plan Note (Signed)
Discussed dietary changes and exercise.  Weight gain possibly related to Lexapro.  This medication will be changed.

## 2017-08-05 NOTE — Assessment & Plan Note (Signed)
Patient has had continued discomfort following her hernia repair surgery 6 months ago.  The pain has been unchanged.  We will get her to see the surgeon again to discuss.  She is given return precautions.

## 2017-08-05 NOTE — Assessment & Plan Note (Signed)
Well-controlled on no medication.  She will continue to monitor.

## 2017-08-05 NOTE — Assessment & Plan Note (Signed)
This has been relatively well controlled until recently with her weight gain.  This has caused some increase in her depression.  She reports thoughts of harming herself only when she looks at the scale though has no plan or intent to harm herself.  Reports she has her significant other to live for.  We will have her stop the Lexapro given it can be associated with weight gain.  We will start her on Wellbutrin.  She will monitor her symptoms.  She is given return precautions.

## 2017-08-05 NOTE — Patient Instructions (Addendum)
Nice to see you. We will get you set up to see your surgeon again. We will switch you to Wellbutrin.  Please stop the Lexapro.  You can start on the Wellbutrin at this time. If you develop thoughts of harming yourself please go to the emergency room immediately.

## 2017-08-05 NOTE — Progress Notes (Signed)
Tommi Rumps, MD Phone: [phone]  Anne [name] is a 67 y.o. female who presents today for f/u.  CC: htn, abdominal pain, depression  Hypertension: Typically 118-125/70-75.  Over the last several months.  No chest pain, shortness breath, or edema.  Patient notes some weight gain peanuts and cracker jacks and popcorn.  Not eating terribly healthfully.  Not exercising much though does get on a bike for 10 to 15 minutes.  Depression: She notes she was doing well until recently with the noted weight gain.  She has been on Lexapro.  Some thoughts of harming herself very occasionally depending on what the scale says though she has no plan or intent to harm herself.  She verbalizes that she has her significant other to live for.  The patient has continued to have some discomfort in the area of her prior hernia repair.  Sometimes it is intense and sometimes it is mild.  Notes it is over her upper abdomen.  Its there all the time.  It has been there since the surgery and has not improved or worsened.  She has had no nausea or vomiting.  She has chronic loose stools though no liquid stools.  No blood in her stool.  Social History   Tobacco Use  Smoking Status Former Smoker  . Last attempt to quit: 02/07/2012  . Years since quitting: 5.4  Smokeless Tobacco Never Used     ROS see history of present illness  Objective  Physical Exam Vitals:   08/05/17 0942  BP: 138/78  Pulse: 76  Temp: 97.8 F (36.6 C)  SpO2: 93%    BP Readings from Last 3 Encounters:  08/05/17 138/78  06/04/17 108/68  05/06/17 (!) 146/84   Wt Readings from Last 3 Encounters:  08/05/17 199 lb 3.2 oz (90.4 kg)  06/04/17 174 lb (78.9 kg)  05/06/17 169 lb 12.8 oz (77 kg)    Physical Exam  Constitutional: No distress.  Cardiovascular: Normal rate, regular rhythm and normal heart sounds.  Pulmonary/Chest: Effort normal and breath sounds normal.  Abdominal: Soft. Bowel sounds are normal.  Slight tenderness  over mid upper and mid left abdomen, no guarding, no rebound, no palpable hernia  Musculoskeletal: She exhibits no edema.  Neurological: She is alert.  Skin: Skin is warm and dry. She is not diaphoretic.     Assessment/Plan: Please see individual problem list.  Benign essential HTN Well-controlled on no medication.  She will continue to monitor.  Depression This has been relatively well controlled until recently with her weight gain.  This has caused some increase in her depression.  She reports thoughts of harming herself only when she looks at the scale though has no plan or intent to harm herself.  Reports she has her significant other to live for.  We will have her stop the Lexapro given it can be associated with weight gain.  We will start her on Wellbutrin.  She will monitor her symptoms.  She is given return precautions.  Obesity (BMI 30.0-34.9) Discussed dietary changes and exercise.  Weight gain possibly related to Lexapro.  This medication will be changed.  Abdominal wall hernia Patient has had continued discomfort following her hernia repair surgery 6 months ago.  The pain has been unchanged.  We will get her to see the surgeon again to discuss.  She is given return precautions.   No orders of the defined types were placed in this encounter.   Meds ordered this encounter  Medications  .  buPROPion (WELLBUTRIN XL) 150 MG 24 hr tablet    Sig: Take 1 tablet (150 mg total) by mouth daily.    Dispense:  30 tablet    Refill:  2     Tommi Rumps, MD [place]

## 2017-09-05 ENCOUNTER — Ambulatory Visit: Payer: PPO | Admitting: General Surgery

## 2017-11-14 ENCOUNTER — Other Ambulatory Visit: Payer: Self-pay | Admitting: Family Medicine

## 2017-11-20 ENCOUNTER — Ambulatory Visit (INDEPENDENT_AMBULATORY_CARE_PROVIDER_SITE_OTHER): Payer: PPO | Admitting: Family Medicine

## 2017-11-20 ENCOUNTER — Encounter: Payer: Self-pay | Admitting: Family Medicine

## 2017-11-20 DIAGNOSIS — IMO0001 Reserved for inherently not codable concepts without codable children: Secondary | ICD-10-CM

## 2017-11-20 DIAGNOSIS — F331 Major depressive disorder, recurrent, moderate: Secondary | ICD-10-CM

## 2017-11-20 DIAGNOSIS — R911 Solitary pulmonary nodule: Secondary | ICD-10-CM | POA: Diagnosis not present

## 2017-11-20 DIAGNOSIS — I10 Essential (primary) hypertension: Secondary | ICD-10-CM

## 2017-11-20 MED ORDER — BUPROPION HCL ER (XL) 300 MG PO TB24: 300.0000 mg | ORAL_TABLET | Freq: Every day | ORAL | 1 refills | Status: DC

## 2017-11-20 NOTE — Assessment & Plan Note (Signed)
Has improved somewhat.  We will increase her Wellbutrin to 300 mg daily.  She will monitor her blood pressure with this.  She will monitor for worsening depression.  Given return precautions.

## 2017-11-20 NOTE — Assessment & Plan Note (Signed)
Patient needs follow-up imaging in December.  Order previously placed.  I have sent a message to our referral coordinator to get this scheduled.

## 2017-11-20 NOTE — Progress Notes (Signed)
  Tommi Rumps, MD Phone: [phone]  Anne [name] is a 67 y.o. female who presents today for f/u.  CC: depression, htn, lung nodule  Depression: Patient notes she still has some depression.  Notes the weather makes it worse when it has been outside.  She does feel improved on the Wellbutrin.  No SI.  No HI.  She declines seeing a therapist.  Hypertension: Not on medication.  Typically runs 665-993 at home systolically.  No chest pain or shortness of breath.  Lung nodule: Noted on prior imaging.  She is due for repeat in December.  She notes no hemoptysis.  She did smoke up to 2 packs/day since age 65.  Quit 5 years ago.  Social History   Tobacco Use  Smoking Status Former Smoker  . Last attempt to quit: 02/07/2012  . Years since quitting: 5.7  Smokeless Tobacco Never Used     ROS see history of present illness  Objective  Physical Exam Vitals:   11/20/17 1056  BP: 132/70  Pulse: 82  Temp: 97.7 F (36.5 C)  SpO2: 96%    BP Readings from Last 3 Encounters:  11/20/17 132/70  08/05/17 138/78  06/04/17 108/68   Wt Readings from Last 3 Encounters:  11/20/17 199 lb 6.4 oz (90.4 kg)  08/05/17 199 lb 3.2 oz (90.4 kg)  06/04/17 174 lb (78.9 kg)    Physical Exam  Constitutional: No distress.  Cardiovascular: Normal rate, regular rhythm and normal heart sounds.  Pulmonary/Chest: Effort normal and breath sounds normal.  Musculoskeletal: She exhibits no edema.  Neurological: She is alert.  Skin: Skin is warm and dry. She is not diaphoretic.     Assessment/Plan: Please see individual problem list.  Lung nodule < 6cm on CT Patient needs follow-up imaging in December.  Order previously placed.  I have sent a message to our referral coordinator to get this scheduled.  Benign essential HTN Well-controlled.  Not on any medications.  She will continue to monitor at home.  Depression Has improved somewhat.  We will increase her Wellbutrin to 300 mg daily.  She will  monitor her blood pressure with this.  She will monitor for worsening depression.  Given return precautions.    No orders of the defined types were placed in this encounter.   Meds ordered this encounter  Medications  . buPROPion (WELLBUTRIN XL) 300 MG 24 hr tablet    Sig: Take 1 tablet (300 mg total) by mouth daily.    Dispense:  90 tablet    Refill:  1    Please consider 90 day supplies to promote better adherence     Tommi Rumps, MD [name]

## 2017-11-20 NOTE — Patient Instructions (Signed)
Nice to see you. We will increase your Wellbutrin to 300 mg daily.  I sent a new prescription to your pharmacy.  Please monitor your depression and if it worsens please let us know.  If you develop thoughts of harming yourself or others please go to the emergency room. Please continue to periodically monitor your blood pressure.  If it starts to go up above 140/90 consistently please let us know. We will get you set up for CT scan to follow-up on your lung nodule.

## 2017-11-20 NOTE — Assessment & Plan Note (Signed)
Well-controlled.  Not on any medications.  She will continue to monitor at home.

## 2017-11-21 ENCOUNTER — Encounter: Payer: Self-pay | Admitting: Family Medicine

## 2017-11-21 DIAGNOSIS — R040 Epistaxis: Secondary | ICD-10-CM

## 2017-12-02 ENCOUNTER — Other Ambulatory Visit (INDEPENDENT_AMBULATORY_CARE_PROVIDER_SITE_OTHER): Payer: PPO

## 2017-12-02 DIAGNOSIS — R040 Epistaxis: Secondary | ICD-10-CM

## 2017-12-02 LAB — CBC WITH DIFFERENTIAL/PLATELET
BASOS ABS: 0 10*3/uL (ref 0.0–0.1)
Basophils Relative: 0.7 % (ref 0.0–3.0)
EOS ABS: 0.1 10*3/uL (ref 0.0–0.7)
Eosinophils Relative: 1.4 % (ref 0.0–5.0)
HEMATOCRIT: 39.4 % (ref 36.0–46.0)
Hemoglobin: 12.8 g/dL (ref 12.0–15.0)
Lymphocytes Relative: 26.3 % (ref 12.0–46.0)
Lymphs Abs: 1.8 10*3/uL (ref 0.7–4.0)
MCHC: 32.4 g/dL (ref 30.0–36.0)
MCV: 85.8 fl (ref 78.0–100.0)
Monocytes Absolute: 0.3 10*3/uL (ref 0.1–1.0)
Monocytes Relative: 4.6 % (ref 3.0–12.0)
NEUTROS ABS: 4.6 10*3/uL (ref 1.4–7.7)
NEUTROS PCT: 67 % (ref 43.0–77.0)
PLATELETS: 190 10*3/uL (ref 150.0–400.0)
RBC: 4.6 Mil/uL (ref 3.87–5.11)
RDW: 15.9 % — ABNORMAL HIGH (ref 11.5–15.5)
WBC: 6.9 10*3/uL (ref 4.0–10.5)

## 2017-12-06 ENCOUNTER — Encounter: Payer: Self-pay | Admitting: Family Medicine

## 2017-12-06 ENCOUNTER — Ambulatory Visit (INDEPENDENT_AMBULATORY_CARE_PROVIDER_SITE_OTHER): Payer: PPO | Admitting: Family Medicine

## 2017-12-06 VITALS — BP 120/80 | HR 71 | Temp 97.8°F | Ht 65.0 in

## 2017-12-06 DIAGNOSIS — Z23 Encounter for immunization: Secondary | ICD-10-CM | POA: Diagnosis not present

## 2017-12-06 DIAGNOSIS — T148XXA Other injury of unspecified body region, initial encounter: Secondary | ICD-10-CM | POA: Insufficient documentation

## 2017-12-06 DIAGNOSIS — S51819A Laceration without foreign body of unspecified forearm, initial encounter: Secondary | ICD-10-CM

## 2017-12-06 LAB — PROTIME-INR
INR: 1 ratio (ref 0.8–1.0)
PROTHROMBIN TIME: 11.6 s (ref 9.6–13.1)

## 2017-12-06 NOTE — Patient Instructions (Signed)
Nice to see you. We will update your tetanus shot today. Please monitor the area and cleanse with soap and water. If the area is not healing please let us know. If you develop erythema, drainage, swelling, pain, or any new symptoms please seek medical attention.

## 2017-12-06 NOTE — Progress Notes (Signed)
  Tommi Rumps, MD Phone: [phone]  Anne [name] is a 67 y.o. female who presents today for same-day visit.  CC: Skin tear, bruising  Patient notes the skin tear occurred 3 days ago when her dog got excited and ran towards her significant other and ended up scratching her right forearm.  She notes it has been oozing blood since then.  It was not a bite wound.  There has been no fever or pain.  No erythema.  Her last tetanus vaccination was in 2011.  She has cleansed it with soap and water.  She does note intermittent issues with bruising just on her arms.  No bruising on her legs or the rest of her body.  Recent CBC with normal platelet count.  Social History   Tobacco Use  Smoking Status Former Smoker  . Last attempt to quit: 02/07/2012  . Years since quitting: 5.8  Smokeless Tobacco Never Used     ROS see history of present illness  Objective  Physical Exam Vitals:   12/06/17 0838  BP: 120/80  Pulse: 71  Temp: 97.8 F (36.6 C)  SpO2: 96%    BP Readings from Last 3 Encounters:  12/06/17 120/80  11/20/17 132/70  08/05/17 138/78   Wt Readings from Last 3 Encounters:  11/20/17 199 lb 6.4 oz (90.4 kg)  08/05/17 199 lb 3.2 oz (90.4 kg)  06/04/17 174 lb (78.9 kg)    Physical Exam  Constitutional: She appears well-developed and well-nourished.  Musculoskeletal:       Arms: There are a couple of bruises noted on bilateral forearms   Assessment/Plan: Please see individual problem list.  Skin tear of forearm without complication, initial encounter Skin tear related to dog scratch.  There was no dog bite.  Patient's dog is vaccinated.  There are no signs of infection.  It was oozing blood initially though with pressure this stopped.  The area was cleansed with saline.  The area was dressed.  Discussed cleansing with soap and water.  If not healing in the next 1 to 2 weeks she will let us know.  If she develops signs of infection she will be evaluated.  Td  updated.  Bruising Just on forearms.  Suspect this is related to skin thinning out as she ages and bumping into things though we will check an INR to ensure that is not abnormal.  Recent CBC was acceptable.   Orders Placed This Encounter  Procedures  . Protime-INR    No orders of the defined types were placed in this encounter.    Tommi Rumps, MD [place]

## 2017-12-06 NOTE — Assessment & Plan Note (Signed)
Just on forearms.  Suspect this is related to skin thinning out as she ages and bumping into things though we will check an INR to ensure that is not abnormal.  Recent CBC was acceptable.

## 2017-12-06 NOTE — Addendum Note (Signed)
Addended by: Myriam Forehand on: 12/06/2017 10:08 AM   Modules accepted: Orders

## 2017-12-06 NOTE — Assessment & Plan Note (Addendum)
Skin tear related to dog scratch.  There was no dog bite.  Patient's dog is vaccinated.  There are no signs of infection.  It was oozing blood initially though with pressure this stopped.  The area was cleansed with saline.  The area was dressed.  Discussed cleansing with soap and water.  If not healing in the next 1 to 2 weeks she will let us know.  If she develops signs of infection she will be evaluated.  Td updated.

## 2017-12-11 ENCOUNTER — Other Ambulatory Visit: Payer: Self-pay | Admitting: Family Medicine

## 2018-01-09 ENCOUNTER — Ambulatory Visit: Admission: RE | Admit: 2018-01-09 | Payer: PPO | Source: Ambulatory Visit

## 2018-02-12 DIAGNOSIS — H353131 Nonexudative age-related macular degeneration, bilateral, early dry stage: Secondary | ICD-10-CM | POA: Diagnosis not present

## 2018-02-26 ENCOUNTER — Other Ambulatory Visit: Payer: Self-pay | Admitting: Family Medicine

## 2018-02-26 ENCOUNTER — Ambulatory Visit (INDEPENDENT_AMBULATORY_CARE_PROVIDER_SITE_OTHER): Payer: PPO | Admitting: Family Medicine

## 2018-02-26 ENCOUNTER — Encounter: Payer: Self-pay | Admitting: Family Medicine

## 2018-02-26 VITALS — BP 130/82 | HR 79 | Temp 98.1°F | Ht 65.0 in | Wt 195.4 lb

## 2018-02-26 DIAGNOSIS — T148XXA Other injury of unspecified body region, initial encounter: Secondary | ICD-10-CM

## 2018-02-26 DIAGNOSIS — IMO0001 Reserved for inherently not codable concepts without codable children: Secondary | ICD-10-CM

## 2018-02-26 DIAGNOSIS — H539 Unspecified visual disturbance: Secondary | ICD-10-CM | POA: Diagnosis not present

## 2018-02-26 DIAGNOSIS — R040 Epistaxis: Secondary | ICD-10-CM | POA: Diagnosis not present

## 2018-02-26 DIAGNOSIS — R911 Solitary pulmonary nodule: Secondary | ICD-10-CM | POA: Diagnosis not present

## 2018-02-26 DIAGNOSIS — E785 Hyperlipidemia, unspecified: Secondary | ICD-10-CM | POA: Diagnosis not present

## 2018-02-26 DIAGNOSIS — I10 Essential (primary) hypertension: Secondary | ICD-10-CM

## 2018-02-26 DIAGNOSIS — F331 Major depressive disorder, recurrent, moderate: Secondary | ICD-10-CM

## 2018-02-26 DIAGNOSIS — N179 Acute kidney failure, unspecified: Secondary | ICD-10-CM

## 2018-02-26 DIAGNOSIS — K7460 Unspecified cirrhosis of liver: Secondary | ICD-10-CM

## 2018-02-26 LAB — LDL CHOLESTEROL, DIRECT: LDL DIRECT: 48 mg/dL

## 2018-02-26 LAB — CBC
HCT: 39.8 % (ref 36.0–46.0)
HEMOGLOBIN: 12.7 g/dL (ref 12.0–15.0)
MCHC: 32 g/dL (ref 30.0–36.0)
MCV: 84.3 fl (ref 78.0–100.0)
PLATELETS: 199 10*3/uL (ref 150.0–400.0)
RBC: 4.72 Mil/uL (ref 3.87–5.11)
RDW: 15.6 % — AB (ref 11.5–15.5)
WBC: 6.3 10*3/uL (ref 4.0–10.5)

## 2018-02-26 LAB — COMPREHENSIVE METABOLIC PANEL
ALBUMIN: 4 g/dL (ref 3.5–5.2)
ALT: 11 U/L (ref 0–35)
AST: 17 U/L (ref 0–37)
Alkaline Phosphatase: 105 U/L (ref 39–117)
BUN: 11 mg/dL (ref 6–23)
CHLORIDE: 104 meq/L (ref 96–112)
CO2: 32 meq/L (ref 19–32)
Calcium: 9.3 mg/dL (ref 8.4–10.5)
Creatinine, Ser: 0.98 mg/dL (ref 0.40–1.20)
GFR: 56.11 mL/min — AB (ref >60.00)
GLUCOSE: 95 mg/dL (ref 70–99)
POTASSIUM: 3.5 meq/L (ref 3.5–5.1)
Sodium: 143 mEq/L (ref 135–145)
Total Bilirubin: 0.5 mg/dL (ref 0.2–1.2)
Total Protein: 6.8 g/dL (ref 6.0–8.3)

## 2018-02-26 MED ORDER — ESCITALOPRAM OXALATE 10 MG PO TABS: 10.0000 mg | ORAL_TABLET | Freq: Every day | ORAL | 1 refills | Status: DC

## 2018-02-26 NOTE — Assessment & Plan Note (Signed)
This has improved.  She will monitor.  I suspect this is related to trauma.

## 2018-02-26 NOTE — Assessment & Plan Note (Signed)
We will get this rescheduled.  I discussed this with our referral coordinator.  She will get the patient rescheduled.

## 2018-02-26 NOTE — Assessment & Plan Note (Addendum)
Slight worsening.  She does have thoughts that she would be better off dead though no active suicidal thoughts or intent or plan.  We will add Lexapro.  Discussed that it may take up to 2 months for this to be beneficial.  She will continue Wellbutrin.  Discussed therapy referral though she deferred.  She is given return precautions.  Follow-up in 6 to 8 weeks.

## 2018-02-26 NOTE — Patient Instructions (Addendum)
Nice to see you. We will start you on lexapro.  This may take up to 2 months to become beneficial.  You will continue your Wellbutrin.  If you develop thoughts of harming yourself or plan or intent to harm yourself please go to the emergency room or call 911. We will get you to see ENT and GI. If you develop a nosebleed that she cannot get to stop within 15 minutes please be evaluated at urgent care or the emergency room. We will get you set up for your CT scan of your chest. Please try using nasal saline gel to moisturize your nose.

## 2018-02-26 NOTE — Assessment & Plan Note (Signed)
She has been evaluated by ophthalmology.  No recent changes.  She will monitor and follow-up with ophthalmology if she notices any vision changes.

## 2018-02-26 NOTE — Assessment & Plan Note (Signed)
Well-controlled.  She will monitor.

## 2018-02-26 NOTE — Progress Notes (Signed)
Tommi Rumps, MD Phone: [phone]  Anne [name] is a 68 y.o. female who presents today for follow-up.  CC: Depression, hyperlipidemia, cirrhosis, lung nodule, vision difficulty, nosebleeds  Depression: Patient notes recently she has felt like this has worsened.  She notes the cold weather gets her down.  She is tired of being alone at home.  Her husband works as a Administrator.  She notes she feels like she is a burden to her husband.  She at times thinks that she would be better off dead though has no thoughts of harming herself or plan or intent to harm herself.  She has her husband to live for.  She is taking Wellbutrin.  Hyperlipidemia: Taking Crestor.  No chest pain, right upper quadrant pain, or myalgias.  Cirrhosis: She has not followed up with GI.  She would prefer to see a different GI physician.  She does not use Tylenol.  No alcohol use.  Lung nodule: She was supposed to have a follow-up CT scan.  This was not completed.  She does have a smoking history.  No hemoptysis.  Vision difficulty: She notes she has had to go back into glasses.  She is having issues with her distance vision.  She was just evaluated by ophthalmology and has not noticed any worsening since she saw them 3 weeks ago.  She denies history of glaucoma or macular degeneration.  Nosebleeds: She notes over the last month or so she has developed nosebleeds out of her right nostril that occurs when she bends over.  She is not bleeding from anywhere else.  She does note occasional bruises on her arms though this is related to bumping into things or her dogs jumping on her.  No bruising elsewhere.  Social History   Tobacco Use  Smoking Status Former Smoker  . Last attempt to quit: 02/07/2012  . Years since quitting: 6.0  Smokeless Tobacco Never Used     ROS see history of present illness  Objective  Physical Exam Vitals:   02/26/18 1257  BP: 130/82  Pulse: 79  Temp: 98.1 F (36.7 C)  SpO2: 94%     BP Readings from Last 3 Encounters:  02/26/18 130/82  12/06/17 120/80  11/20/17 132/70   Wt Readings from Last 3 Encounters:  02/26/18 195 lb 6.4 oz (88.6 kg)  11/20/17 199 lb 6.4 oz (90.4 kg)  08/05/17 199 lb 3.2 oz (90.4 kg)    Physical Exam Constitutional:      General: She is not in acute distress.    Appearance: She is not diaphoretic.  HENT:     Nose:     Comments: Bilateral nasal mucosa with erythema and slight edema, no obvious signs of bleeding Cardiovascular:     Rate and Rhythm: Normal rate and regular rhythm.     Heart sounds: Normal heart sounds.  Pulmonary:     Effort: Pulmonary effort is normal.     Breath sounds: Normal breath sounds.  Abdominal:     General: Bowel sounds are normal. There is no distension.     Palpations: Abdomen is soft.     Tenderness: There is no abdominal tenderness. There is no guarding or rebound.  Skin:    General: Skin is warm and dry.  Neurological:     Mental Status: She is alert.      Assessment/Plan: Please see individual problem list.  Benign essential HTN Well-controlled.  She will monitor.  Cirrhosis of liver ([place]) Discussed the importance of GI  follow-up.  We will refer to [facility].  Bruising This has improved.  She will monitor.  I suspect this is related to trauma.  Bleeding nose No active bleeding.  Discussed nasal saline gel.  Will refer to ENT.  Discussed that if she has no additional issues with nosebleeds prior to her ENT appointment she could cancel this.  I did discuss the importance of canceling it if she was not going to go given that she may be charged for a no-show.  Discussed return precautions.  Check CBC.  Depression Slight worsening.  She does have thoughts that she would be better off dead though no active suicidal thoughts or intent or plan.  We will add Lexapro.  Discussed that it may take up to 2 months for this to be beneficial.  She will continue Wellbutrin.  Discussed therapy referral  though she deferred.  She is given return precautions.  Follow-up in 6 to 8 weeks.  Lung nodule < 6cm on CT We will get this rescheduled.  I discussed this with our referral coordinator.  She will get the patient rescheduled.  Vision changes She has been evaluated by ophthalmology.  No recent changes.  She will monitor and follow-up with ophthalmology if she notices any vision changes.   Orders Placed This Encounter  Procedures  . CBC  . Comp Met (CMET)  . Direct LDL  . Ambulatory referral to Gastroenterology    Referral Priority:   Routine    Referral Type:   Consultation    Referral Reason:   Specialty Services Required    Number of Visits Requested:   1  . Ambulatory referral to ENT    Referral Priority:   Routine    Referral Type:   Consultation    Referral Reason:   Specialty Services Required    Requested Specialty:   Otolaryngology    Number of Visits Requested:   1    Meds ordered this encounter  Medications  . escitalopram (LEXAPRO) 10 MG tablet    Sig: Take 1 tablet (10 mg total) by mouth daily.    Dispense:  90 tablet    Refill:  1     Tommi Rumps, MD [place]

## 2018-02-26 NOTE — Assessment & Plan Note (Addendum)
No active bleeding.  Discussed nasal saline gel.  Will refer to ENT.  Discussed that if she has no additional issues with nosebleeds prior to her ENT appointment she could cancel this.  I did discuss the importance of canceling it if she was not going to go given that she may be charged for a no-show.  Discussed return precautions.  Check CBC.

## 2018-02-26 NOTE — Assessment & Plan Note (Signed)
Discussed the importance of GI follow-up.  We will refer to [facility].

## 2018-03-06 ENCOUNTER — Ambulatory Visit: Payer: PPO

## 2018-03-07 ENCOUNTER — Other Ambulatory Visit: Payer: PPO

## 2018-04-11 ENCOUNTER — Other Ambulatory Visit: Payer: Self-pay | Admitting: Family Medicine

## 2018-04-17 ENCOUNTER — Ambulatory Visit: Payer: PPO | Admitting: Family Medicine

## 2018-04-18 ENCOUNTER — Ambulatory Visit: Payer: PPO | Admitting: Family Medicine

## 2018-04-28 DIAGNOSIS — M9903 Segmental and somatic dysfunction of lumbar region: Secondary | ICD-10-CM | POA: Diagnosis not present

## 2018-04-28 DIAGNOSIS — M5136 Other intervertebral disc degeneration, lumbar region: Secondary | ICD-10-CM | POA: Diagnosis not present

## 2018-05-21 DIAGNOSIS — M9903 Segmental and somatic dysfunction of lumbar region: Secondary | ICD-10-CM | POA: Diagnosis not present

## 2018-05-21 DIAGNOSIS — M5136 Other intervertebral disc degeneration, lumbar region: Secondary | ICD-10-CM | POA: Diagnosis not present

## 2018-05-23 DIAGNOSIS — M5136 Other intervertebral disc degeneration, lumbar region: Secondary | ICD-10-CM | POA: Diagnosis not present

## 2018-05-23 DIAGNOSIS — M9903 Segmental and somatic dysfunction of lumbar region: Secondary | ICD-10-CM | POA: Diagnosis not present

## 2018-05-26 DIAGNOSIS — M5136 Other intervertebral disc degeneration, lumbar region: Secondary | ICD-10-CM | POA: Diagnosis not present

## 2018-05-26 DIAGNOSIS — M9903 Segmental and somatic dysfunction of lumbar region: Secondary | ICD-10-CM | POA: Diagnosis not present

## 2018-06-02 DIAGNOSIS — M5441 Lumbago with sciatica, right side: Secondary | ICD-10-CM | POA: Diagnosis not present

## 2018-06-02 DIAGNOSIS — M9903 Segmental and somatic dysfunction of lumbar region: Secondary | ICD-10-CM | POA: Diagnosis not present

## 2018-06-02 DIAGNOSIS — M7611 Psoas tendinitis, right hip: Secondary | ICD-10-CM | POA: Diagnosis not present

## 2018-06-02 DIAGNOSIS — M5136 Other intervertebral disc degeneration, lumbar region: Secondary | ICD-10-CM | POA: Diagnosis not present

## 2018-06-06 ENCOUNTER — Ambulatory Visit (INDEPENDENT_AMBULATORY_CARE_PROVIDER_SITE_OTHER): Payer: PPO | Admitting: Family Medicine

## 2018-06-06 ENCOUNTER — Other Ambulatory Visit: Payer: Self-pay

## 2018-06-06 ENCOUNTER — Telehealth: Payer: Self-pay | Admitting: Family Medicine

## 2018-06-06 DIAGNOSIS — M5441 Lumbago with sciatica, right side: Secondary | ICD-10-CM | POA: Diagnosis not present

## 2018-06-06 DIAGNOSIS — M7611 Psoas tendinitis, right hip: Secondary | ICD-10-CM | POA: Diagnosis not present

## 2018-06-06 DIAGNOSIS — M5136 Other intervertebral disc degeneration, lumbar region: Secondary | ICD-10-CM | POA: Diagnosis not present

## 2018-06-06 DIAGNOSIS — M25551 Pain in right hip: Secondary | ICD-10-CM

## 2018-06-06 DIAGNOSIS — M9903 Segmental and somatic dysfunction of lumbar region: Secondary | ICD-10-CM | POA: Diagnosis not present

## 2018-06-06 NOTE — Progress Notes (Addendum)
Patient ID: Anne [name], female   DOB: Aug 22, 1950, 68 y.o.   MRN: [id number]  Virtual Visit via phone Note  This visit type was conducted due to national recommendations for restrictions regarding the COVID-19 pandemic (e.g. social distancing).  This format is felt to be most appropriate for this patient at this time.  All issues noted in this document were discussed and addressed.  No physical exam was performed (except for noted visual exam findings with Video Visits).   I connected with Anne [name] on 06/06/18 at  1:00 PM EDT by a video enabled telemedicine application or telephone and verified that I am speaking with the correct person using two identifiers. Location patient: home Location provider: [place] Persons participating in the virtual visit: patient, provider  I discussed the limitations, risks, security and privacy concerns of performing an evaluation and management service by telephone and the availability of in person appointments. I also discussed with the patient that there may be a patient responsible charge related to this service. The patient expressed understanding and agreed to proceed.  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   HPI:  Patient and I connected via phone due to complaint of right hip pain.  Denies any fall or injury to the right hip.  States just begun to ache more and more over the past few weeks.  Patient does not take anything over-the-counter for the pain due to history of liver cirrhosis and kidney issues, so is unsure of what she could take.  Has not tried any over-the-counter rubs or heating pad on the area either.  Denies any past injuries to the hip.  Otherwise denies fever or chills.  Denies body aches.  Denies shortness of breath or wheezing.  Denies GI or GU  issues.   ROS: See pertinent positives and negatives per HPI.  Past Medical History:  Diagnosis Date  . Arthritis   . Cirrhosis ([place])   . Depression   . GERD (gastroesophageal reflux disease)   . Hyperlipidemia   . Hypertension     Past Surgical History:  Procedure Laterality Date  . APPENDECTOMY  1971  . CHOLECYSTECTOMY  1971  . COLONOSCOPY WITH PROPOFOL N/A 11/15/2016   Procedure: COLONOSCOPY WITH PROPOFOL;  Surgeon: Jonathon Bellows, MD;  [facility] ENDOSCOPY;  Service: Gastroenterology;  Laterality: N/A;  . ESOPHAGOGASTRODUODENOSCOPY (EGD) WITH PROPOFOL N/A 01/07/2017   Procedure: ESOPHAGOGASTRODUODENOSCOPY (EGD) WITH PROPOFOL;  Surgeon: Jonathon Bellows, MD;  Location: [facility] ENDOSCOPY;  Service: Gastroenterology;  Laterality: N/A;  . LAPAROSCOPY N/A 03/01/2017   Procedure: LAPAROSCOPY DIAGNOSTIC;  Surgeon: Robert Bellow, MD;  Location: [facility] ORS;  Service: General;  Laterality: N/A;  . TONSILECTOMY, ADENOIDECTOMY, BILATERAL MYRINGOTOMY [place]  . TONSILLECTOMY    . VENTRAL HERNIA REPAIR N/A 03/01/2017   10 x 14 CM Ventralight ST mesh, intraperitoneal location.   . VENTRAL HERNIA REPAIR N/A 03/01/2017   Procedure: HERNIA REPAIR VENTRAL ADULT;  Surgeon: Robert Bellow, MD;  Location: [facility] ORS;  Service: General;  Laterality: N/A;    Family History  Problem Relation Age of Onset  . Cancer Mother        ovarian  . Hypertension Mother   . Ovarian cancer Mother 36  . Heart disease Father   . Stroke Father   . Ovarian cancer Sister        ?  dx cancer had hyst.  . Breast cancer Neg Hx    Social History   Tobacco Use  . Smoking status: Former Smoker    Last attempt to quit: 02/07/2012    Years since quitting: 6.3  . Smokeless tobacco: Never Used  Substance Use Topics  . Alcohol use: No    Alcohol/week: 0.0 standard drinks      Current Outpatient Medications:  .  buPROPion (WELLBUTRIN XL) 300 MG 24 hr tablet, Take 1 tablet (300 mg total) by mouth daily.,  Disp: 90 tablet, Rfl: 1 .  escitalopram (LEXAPRO) 10 MG tablet, Take 1 tablet (10 mg total) by mouth daily., Disp: 90 tablet, Rfl: 1 .  ibuprofen (ADVIL,MOTRIN) 200 MG tablet, Take 400 mg by mouth every 8 (eight) hours as needed (for headaches)., Disp: , Rfl:  .  Multiple Vitamins-Minerals (PRESERVISION AREDS 2) CAPS, Take by mouth., Disp: , Rfl:  .  pantoprazole (PROTONIX) 40 MG tablet, TAKE 1 TABLET BY MOUTH TWICE DAILY BEFORE A MEAL, Disp: 180 tablet, Rfl: 0 .  rosuvastatin (CRESTOR) 20 MG tablet, TAKE 1 TABLET BY MOUTH ONCE DAILY, Disp: 90 tablet, Rfl: 3  EXAM:  GENERAL: alert, oriented, sounds well and in no acute distress  LUNGS: speaking in full sentences, no signs of respiratory distress, breathing rate appears normal, no gasping or wheezing  PSYCH/NEURO: pleasant and cooperative, no obvious depression or anxiety, speech and thought processing grossly intact  ASSESSMENT AND PLAN:  Discussed the following assessment and plan:  Right hip pain - Plan: DG HIP UNILAT WITH PELVIS 2-3 VIEWS RIGHT  We will do a hip x-ray to get a better idea of what the hip joint is looking like - she will come in next week for this.  Advised patient she can try low-dose Tylenol on occasion if needed, but I prefer she do something like a topical rub such as BenGay or Biofreeze on the hip joint or use a heating pad. I will send in some tramadol to try PRN more moderate pain if OTC options not helping.  Depending on what the x-ray shows, we can consider referring her to orthopedics for evaluation to see if something like a joint injection would be beneficial.   I discussed the assessment and treatment plan with the patient. The patient was provided an opportunity to ask questions and all were answered. The patient agreed with the plan and demonstrated an understanding of the instructions.   The patient was advised to call back or seek an in-person evaluation if the symptoms worsen or if the condition fails to  improve as anticipated.  Jodelle Green, FNP

## 2018-06-09 ENCOUNTER — Telehealth: Payer: Self-pay | Admitting: Family Medicine

## 2018-06-09 ENCOUNTER — Ambulatory Visit: Payer: PPO

## 2018-06-09 ENCOUNTER — Ambulatory Visit: Payer: PPO | Admitting: Family Medicine

## 2018-06-09 NOTE — Telephone Encounter (Signed)
Copied from [place] [id number]. Topic: Quick Communication - Rx Refill/Question >> Jun 09, 2018  4:17 PM Nils Flack, Marland Kitchen wrote: Medication: unknown  Has the patient contacted their pharmacy? Yes.   (Agent: If no, request that the patient contact the pharmacy for the refill.) (Agent: If yes, when and what did the pharmacy advise?)  Preferred Pharmacy (with phone number or street name): [facility] [place] - [phone] Pt told pharmacy that something was supposed to have been called in, and the pharm does not have it.  Pt had appt on 05/01  Agent: Please be advised that RX refills may take up to 3 business days. We ask that you follow-up with your pharmacy.

## 2018-06-10 ENCOUNTER — Encounter: Payer: Self-pay | Admitting: Family Medicine

## 2018-06-10 MED ORDER — TRAMADOL HCL 50 MG PO TABS
50.0000 mg | ORAL_TABLET | Freq: Three times a day (TID) | ORAL | 0 refills | Status: AC | PRN
Start: 2018-06-10 — End: 2018-06-15

## 2018-06-10 NOTE — Telephone Encounter (Signed)
Spoke with pt to let her know that the tramadol has been sent in. Pt stated that she is coming in tomorrow to have the xray done.

## 2018-06-10 NOTE — Addendum Note (Signed)
Addended by: Philis Nettle on: 06/10/2018 09:56 AM   Modules accepted: Orders

## 2018-06-10 NOTE — Telephone Encounter (Signed)
Yes, I meant to send in some tramadol. I will send in and she will come in for hip xray

## 2018-06-10 NOTE — Telephone Encounter (Signed)
Last OV was 06/06/2018 with Philis Nettle. Lauren was something suppose to be sent in for the patient?

## 2018-06-10 NOTE — Telephone Encounter (Signed)
Per your notes   We will do a hip x-ray to get a better idea of what the hip joint is looking like - she will come in next week for this.  Advised patient she can try low-dose Tylenol on occasion if needed, but I prefer she do something like a topical rub such as BenGay or Biofreeze on the hip joint or use a heating pad.  Depending on what the x-ray shows, we can consider referring her to orthopedics for evaluation to see if something like a joint injection would be beneficial.  I discussed the assessment and treatment plan with the patient. The patient was provided an opportunity to ask questions and all were answered. The patient agreed with the plan and demonstrated an understanding of the instructions.  The patient was advised to call back or seek an in-person evaluation if the symptoms worsen or if the condition fails to improve as anticipated.

## 2018-06-11 ENCOUNTER — Ambulatory Visit (INDEPENDENT_AMBULATORY_CARE_PROVIDER_SITE_OTHER): Payer: PPO

## 2018-06-11 ENCOUNTER — Other Ambulatory Visit: Payer: Self-pay

## 2018-06-11 DIAGNOSIS — M25551 Pain in right hip: Secondary | ICD-10-CM | POA: Diagnosis not present

## 2018-06-18 DIAGNOSIS — M533 Sacrococcygeal disorders, not elsewhere classified: Secondary | ICD-10-CM | POA: Diagnosis not present

## 2018-06-18 DIAGNOSIS — M47816 Spondylosis without myelopathy or radiculopathy, lumbar region: Secondary | ICD-10-CM | POA: Diagnosis not present

## 2018-06-18 DIAGNOSIS — M25551 Pain in right hip: Secondary | ICD-10-CM | POA: Diagnosis not present

## 2018-06-24 ENCOUNTER — Other Ambulatory Visit: Payer: Self-pay | Admitting: Sports Medicine

## 2018-06-24 DIAGNOSIS — M533 Sacrococcygeal disorders, not elsewhere classified: Secondary | ICD-10-CM

## 2018-06-24 DIAGNOSIS — M47816 Spondylosis without myelopathy or radiculopathy, lumbar region: Secondary | ICD-10-CM

## 2018-06-24 DIAGNOSIS — M545 Low back pain, unspecified: Secondary | ICD-10-CM

## 2018-06-24 DIAGNOSIS — G8929 Other chronic pain: Secondary | ICD-10-CM

## 2018-06-25 ENCOUNTER — Encounter: Payer: Self-pay | Admitting: Lab

## 2018-07-04 ENCOUNTER — Other Ambulatory Visit: Payer: Self-pay | Admitting: Sports Medicine

## 2018-07-04 ENCOUNTER — Ambulatory Visit
Admission: RE | Admit: 2018-07-04 | Discharge: 2018-07-04 | Disposition: A | Discharge status: Home / Self Care | Payer: PPO | Source: Ambulatory Visit | Attending: Sports Medicine | Admitting: Sports Medicine

## 2018-07-04 ENCOUNTER — Other Ambulatory Visit: Payer: Self-pay

## 2018-07-04 DIAGNOSIS — G8929 Other chronic pain: Secondary | ICD-10-CM | POA: Insufficient documentation

## 2018-07-04 DIAGNOSIS — M545 Low back pain, unspecified: Secondary | ICD-10-CM

## 2018-07-04 DIAGNOSIS — M47816 Spondylosis without myelopathy or radiculopathy, lumbar region: Secondary | ICD-10-CM

## 2018-07-04 DIAGNOSIS — M533 Sacrococcygeal disorders, not elsewhere classified: Secondary | ICD-10-CM | POA: Diagnosis not present

## 2018-07-04 DIAGNOSIS — M8448XA Pathological fracture, other site, initial encounter for fracture: Secondary | ICD-10-CM | POA: Diagnosis not present

## 2018-07-04 LAB — POCT I-STAT CREATININE: Creatinine, Ser: 0.8 mg/dL (ref 0.44–1.00)

## 2018-07-04 MED ORDER — GADOBUTROL 1 MMOL/ML IV SOLN
7.5000 mL | Freq: Once | INTRAVENOUS | Status: AC | PRN
  Administered 2018-07-04: 7.5 mL via INTRAVENOUS

## 2018-07-08 ENCOUNTER — Ambulatory Visit: Payer: PPO

## 2018-07-08 ENCOUNTER — Other Ambulatory Visit: Payer: Self-pay

## 2018-07-08 ENCOUNTER — Encounter: Payer: Self-pay | Admitting: Oncology

## 2018-07-08 ENCOUNTER — Inpatient Hospital Stay: Payer: PPO | Attending: Oncology | Admitting: Oncology

## 2018-07-08 ENCOUNTER — Other Ambulatory Visit: Payer: Self-pay | Admitting: *Deleted

## 2018-07-08 VITALS — BP 152/90 | HR 94 | Temp 96.2°F | Resp 18 | Ht 65.0 in | Wt 186.8 lb

## 2018-07-08 DIAGNOSIS — M545 Low back pain: Secondary | ICD-10-CM | POA: Insufficient documentation

## 2018-07-08 DIAGNOSIS — K5900 Constipation, unspecified: Secondary | ICD-10-CM | POA: Diagnosis not present

## 2018-07-08 DIAGNOSIS — F329 Major depressive disorder, single episode, unspecified: Secondary | ICD-10-CM | POA: Insufficient documentation

## 2018-07-08 DIAGNOSIS — D649 Anemia, unspecified: Secondary | ICD-10-CM | POA: Diagnosis not present

## 2018-07-08 DIAGNOSIS — I10 Essential (primary) hypertension: Secondary | ICD-10-CM | POA: Diagnosis not present

## 2018-07-08 DIAGNOSIS — J439 Emphysema, unspecified: Secondary | ICD-10-CM | POA: Diagnosis not present

## 2018-07-08 DIAGNOSIS — Z8041 Family history of malignant neoplasm of ovary: Secondary | ICD-10-CM | POA: Insufficient documentation

## 2018-07-08 DIAGNOSIS — Z87891 Personal history of nicotine dependence: Secondary | ICD-10-CM | POA: Diagnosis not present

## 2018-07-08 DIAGNOSIS — C3411 Malignant neoplasm of upper lobe, right bronchus or lung: Secondary | ICD-10-CM | POA: Insufficient documentation

## 2018-07-08 DIAGNOSIS — M47816 Spondylosis without myelopathy or radiculopathy, lumbar region: Secondary | ICD-10-CM | POA: Insufficient documentation

## 2018-07-08 DIAGNOSIS — E785 Hyperlipidemia, unspecified: Secondary | ICD-10-CM | POA: Diagnosis not present

## 2018-07-08 DIAGNOSIS — I700 Atherosclerosis of aorta: Secondary | ICD-10-CM | POA: Diagnosis not present

## 2018-07-08 DIAGNOSIS — Z791 Long term (current) use of non-steroidal anti-inflammatories (NSAID): Secondary | ICD-10-CM | POA: Diagnosis not present

## 2018-07-08 DIAGNOSIS — Z79899 Other long term (current) drug therapy: Secondary | ICD-10-CM | POA: Diagnosis not present

## 2018-07-08 DIAGNOSIS — M8448XA Pathological fracture, other site, initial encounter for fracture: Secondary | ICD-10-CM | POA: Insufficient documentation

## 2018-07-08 DIAGNOSIS — I2510 Atherosclerotic heart disease of native coronary artery without angina pectoris: Secondary | ICD-10-CM | POA: Insufficient documentation

## 2018-07-08 DIAGNOSIS — M25551 Pain in right hip: Secondary | ICD-10-CM | POA: Diagnosis not present

## 2018-07-08 DIAGNOSIS — R5383 Other fatigue: Secondary | ICD-10-CM | POA: Insufficient documentation

## 2018-07-08 DIAGNOSIS — M48061 Spinal stenosis, lumbar region without neurogenic claudication: Secondary | ICD-10-CM | POA: Diagnosis not present

## 2018-07-08 DIAGNOSIS — R590 Localized enlarged lymph nodes: Secondary | ICD-10-CM | POA: Diagnosis not present

## 2018-07-08 DIAGNOSIS — R911 Solitary pulmonary nodule: Secondary | ICD-10-CM | POA: Diagnosis not present

## 2018-07-08 DIAGNOSIS — K7460 Unspecified cirrhosis of liver: Secondary | ICD-10-CM

## 2018-07-08 DIAGNOSIS — Z8249 Family history of ischemic heart disease and other diseases of the circulatory system: Secondary | ICD-10-CM | POA: Insufficient documentation

## 2018-07-08 DIAGNOSIS — C7951 Secondary malignant neoplasm of bone: Secondary | ICD-10-CM

## 2018-07-08 DIAGNOSIS — M533 Sacrococcygeal disorders, not elsewhere classified: Secondary | ICD-10-CM | POA: Diagnosis not present

## 2018-07-08 DIAGNOSIS — Z823 Family history of stroke: Secondary | ICD-10-CM | POA: Insufficient documentation

## 2018-07-08 DIAGNOSIS — G893 Neoplasm related pain (acute) (chronic): Secondary | ICD-10-CM | POA: Diagnosis not present

## 2018-07-08 LAB — CBC WITH DIFFERENTIAL/PLATELET
Abs Immature Granulocytes: 0.02 10*3/uL (ref 0.00–0.07)
Basophils Absolute: 0.1 10*3/uL (ref 0.0–0.1)
Basophils Relative: 1 %
Eosinophils Absolute: 0.2 10*3/uL (ref 0.0–0.5)
Eosinophils Relative: 4 %
HCT: 36.3 % (ref 36.0–46.0)
Hemoglobin: 11.1 g/dL — ABNORMAL LOW (ref 12.0–15.0)
Immature Granulocytes: 0 %
Lymphocytes Relative: 23 %
Lymphs Abs: 1.2 10*3/uL (ref 0.7–4.0)
MCH: 26.1 pg (ref 26.0–34.0)
MCHC: 30.6 g/dL (ref 30.0–36.0)
MCV: 85.4 fL (ref 80.0–100.0)
Monocytes Absolute: 0.3 10*3/uL (ref 0.1–1.0)
Monocytes Relative: 5 %
Neutro Abs: 3.4 10*3/uL (ref 1.7–7.7)
Neutrophils Relative %: 67 %
Platelets: 180 10*3/uL (ref 150–400)
RBC: 4.25 MIL/uL (ref 3.87–5.11)
RDW: 15.7 % — ABNORMAL HIGH (ref 11.5–15.5)
WBC: 5.1 10*3/uL (ref 4.0–10.5)
nRBC: 0 % (ref 0.0–0.2)

## 2018-07-08 LAB — COMPREHENSIVE METABOLIC PANEL
ALT: 14 U/L (ref 0–44)
AST: 21 U/L (ref 15–41)
Albumin: 3.7 g/dL (ref 3.5–5.0)
Alkaline Phosphatase: 154 U/L — ABNORMAL HIGH (ref 38–126)
Anion gap: 10 (ref 5–15)
BUN: 20 mg/dL (ref 8–23)
CO2: 26 mmol/L (ref 22–32)
Calcium: 8.9 mg/dL (ref 8.9–10.3)
Chloride: 106 mmol/L (ref 98–111)
Creatinine, Ser: 0.96 mg/dL (ref 0.44–1.00)
GFR calc Af Amer: >60 mL/min (ref >60)
GFR calc non Af Amer: 60 mL/min — ABNORMAL LOW (ref >60)
Glucose, Bld: 91 mg/dL (ref 70–99)
Potassium: 3.6 mmol/L (ref 3.5–5.1)
Sodium: 142 mmol/L (ref 135–145)
Total Bilirubin: 0.7 mg/dL (ref 0.3–1.2)
Total Protein: 7.3 g/dL (ref 6.5–8.1)

## 2018-07-08 LAB — LACTATE DEHYDROGENASE: LDH: 138 U/L (ref 98–192)

## 2018-07-08 MED ORDER — OXYCODONE HCL 5 MG PO TABS: 5.0000 mg | ORAL_TABLET | Freq: Four times a day (QID) | ORAL | 0 refills | Status: DC | PRN

## 2018-07-08 NOTE — Progress Notes (Signed)
Hematology/Oncology Consult note [facility] Telephone:([phone] [phone]  Patient Care Team: Leone Haven, MD as PCP - General (Family Medicine) Leone Haven, MD as Consulting Physician (Family Medicine) Bary Castilla Forest Gleason, MD (General Surgery)   Name of the patient: Anne [name]  [id number]  1950/03/04    Reason for referral- bone metastases   Referring physician- Dorise Hiss PA  Date of visit: 07/08/18   History of presenting illness-patient is a 68 year old female with a past medical history significant for hypertension hyperlipidemia obesity and cirrhosis of the liver among other medical problems.  She has been referred to [place] for findings of bone metastases and her recent MRI.  She has a prior history of 3 packs/day day smoking for over 45 years and quit smoking 5 years ago.She had a CT chest abdomen pelvis in 2018 which showed a 5 mm lung nodule in the left lower lobe.  Recently over the last 2 months patient has been having worsening back pain and was referred to orthopedics.  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  Pathologic fracture of S1.  Patient lives with her husband and is independent of her ADLs and IADLs.  Appetite is fair and she denies any unintentional weight loss.  Denies any night sweats shortness of breath or fatigue.  Denies any bowel bladder incontinence although her bowel habits have been and she sometimes has diarrhea alternating with constipation.  Denies any numbness or weakness in her extremities.  She has been using ibuprofen for her back pain but it has not been helping her much.  Her last screening mammogram was back in 2017.  ECOG PS- 1  Pain scale- 3   Review of systems- Review of Systems  Constitutional: Negative for chills, fever, malaise/fatigue and weight  loss.  HENT: Negative for congestion, ear discharge and nosebleeds.   Eyes: Negative for blurred vision.  Respiratory: Negative for cough, hemoptysis, sputum production, shortness of breath and wheezing.   Cardiovascular: Negative for chest pain, palpitations, orthopnea and claudication.  Gastrointestinal: Negative for abdominal pain, blood in stool, constipation, diarrhea, heartburn, melena, nausea and vomiting.  Genitourinary: Negative for dysuria, flank pain, frequency, hematuria and urgency.  Musculoskeletal: Positive for back pain. Negative for joint pain and myalgias.  Skin: Negative for rash.  Neurological: Negative for dizziness, tingling, focal weakness, seizures, weakness and headaches.  Endo/Heme/Allergies: Does not bruise/bleed easily.  Psychiatric/Behavioral: Negative for depression and suicidal ideas. The patient does not have insomnia.     Allergies  Allergen Reactions  . Bee Venom Swelling    Patient Active Problem List   Diagnosis Date Noted  . Bleeding nose 02/26/2018  . Vision changes 02/26/2018  . Skin tear of forearm without complication, initial encounter 12/06/2017  . Bruising 12/06/2017  . Obesity (BMI 30.0-34.9) 08/05/2017  . Ventral hernia 03/01/2017  . Abdominal wall hernia 02/15/2017  . Lung nodule < 6cm on CT 02/15/2017  . Anemia 01/23/2017  . Cirrhosis of liver ([place]) 01/23/2017  . Gastritis 01/23/2017  . Chest pain 01/03/2017  . Hypokalemia 01/03/2017  . Weight loss 12/13/2016  . Callus of foot 07/27/2016  . Heart murmur 07/27/2016  . Right calf pain 04/20/2016  . Chronic low back pain 08/15/2015  . Right shoulder pain 06/14/2015  . Benign essential HTN 02/22/2015  . HLD (hyperlipidemia) 02/22/2015  . Depression 02/22/2015  Past Medical History:  Diagnosis Date  . Arthritis   . Cirrhosis ([employer])   . Depression   . GERD (gastroesophageal reflux disease)   . Hyperlipidemia   . Hypertension   . Metastatic bone cancer [facility])       Past Surgical History:  Procedure Laterality Date  . APPENDECTOMY  1971  . CHOLECYSTECTOMY  1971  . COLONOSCOPY WITH PROPOFOL N/A 11/15/2016   Procedure: COLONOSCOPY WITH PROPOFOL;  Surgeon: Jonathon Bellows, MD;  Location: [facility] ENDOSCOPY;  Service: Gastroenterology;  Laterality: N/A;  . ESOPHAGOGASTRODUODENOSCOPY (EGD) WITH PROPOFOL N/A 01/07/2017   Procedure: ESOPHAGOGASTRODUODENOSCOPY (EGD) WITH PROPOFOL;  Surgeon: Jonathon Bellows, MD;  Location: [facility] ENDOSCOPY;  Service: Gastroenterology;  Laterality: N/A;  . LAPAROSCOPY N/A 03/01/2017   Procedure: LAPAROSCOPY DIAGNOSTIC;  Surgeon: Robert Bellow, MD;  Location: [facility] ORS;  Service: General;  Laterality: N/A;  . TONSILECTOMY, ADENOIDECTOMY, BILATERAL MYRINGOTOMY [place]  . TONSILLECTOMY    . VENTRAL HERNIA REPAIR N/A 03/01/2017   10 x 14 CM Ventralight ST mesh, intraperitoneal location.   . VENTRAL HERNIA REPAIR N/A 03/01/2017   Procedure: HERNIA REPAIR VENTRAL ADULT;  Surgeon: Robert Bellow, MD;  Location: [facility] ORS;  Service: General;  Laterality: N/A;    Social History   Socioeconomic History  . Marital status: Married    Spouse name: Not on file  . Number of children: Not on file  . Years of education: Not on file  . Highest education level: Not on file  Occupational History  . Not on file  Social Needs  . Financial resource strain: Not hard at all  . Food insecurity:    Worry: Never true    Inability: Never true  . Transportation needs:    Medical: No    Non-medical: No  Tobacco Use  . Smoking status: Former Smoker    Last attempt to quit: 02/07/2012    Years since quitting: 6.4  . Smokeless tobacco: Never Used  Substance and Sexual Activity  . Alcohol use: No    Alcohol/week: 0.0 standard drinks  . Drug use: No  . Sexual activity: Yes  Lifestyle  . Physical activity:    Days per week: Not on file    Minutes per session: Not on file  . Stress: Not at all  Relationships  . Social connections:    Talks on  phone: Not on file    Gets together: Not on file    Attends religious service: Not on file    Active member of club or organization: Not on file    Attends meetings of clubs or organizations: Not on file    Relationship status: Not on file  . Intimate partner violence:    Fear of current or ex partner: No    Emotionally abused: No    Physically abused: No    Forced sexual activity: No  Other Topics Concern  . Not on file  Social History Narrative   Married   Retired   Clinical cytogeneticist level of education    No children    1 cup of coffee     Family History  Problem Relation Age of Onset  . Hypertension Mother   . Ovarian cancer Mother 46  . Heart disease Father   . Stroke Father   . Ovarian cancer Sister        ? dx cancer had hyst.  . Breast cancer Neg Hx      Current Outpatient Medications:  .  buPROPion (  WELLBUTRIN XL) 300 MG 24 hr tablet, Take 1 tablet (300 mg total) by mouth daily., Disp: 90 tablet, Rfl: 1 .  escitalopram (LEXAPRO) 10 MG tablet, Take 1 tablet (10 mg total) by mouth daily., Disp: 90 tablet, Rfl: 1 .  ibuprofen (ADVIL,MOTRIN) 200 MG tablet, Take 400 mg by mouth every 8 (eight) hours as needed (for headaches)., Disp: , Rfl:  .  Multiple Vitamins-Minerals (PRESERVISION AREDS 2) CAPS, Take by mouth., Disp: , Rfl:  .  pantoprazole (PROTONIX) 40 MG tablet, TAKE 1 TABLET BY MOUTH TWICE DAILY BEFORE A MEAL, Disp: 180 tablet, Rfl: 0 .  rosuvastatin (CRESTOR) 20 MG tablet, TAKE 1 TABLET BY MOUTH ONCE DAILY, Disp: 90 tablet, Rfl: 3 .  oxyCODONE (OXY IR/ROXICODONE) 5 MG immediate release tablet, Take 1 tablet (5 mg total) by mouth every 6 (six) hours as needed for severe pain., Disp: 120 tablet, Rfl: 0   Physical exam:  Vitals:   07/08/18 1001  BP: (!) 152/90  Pulse: 94  Resp: 18  Temp: (!) 96.2 F (35.7 C)  TempSrc: Tympanic  Weight: 186 lb 12.8 oz (84.7 kg)  Height: 5\' 5"  (1.651 m)   Physical Exam Constitutional:      General: She is not in acute  distress. HENT:     Head: Normocephalic and atraumatic.  Eyes:     Pupils: Pupils are equal, round, and reactive to light.  Neck:     Musculoskeletal: Normal range of motion.  Cardiovascular:     Rate and Rhythm: Normal rate and regular rhythm.     Heart sounds: Normal heart sounds.  Pulmonary:     Effort: Pulmonary effort is normal.     Breath sounds: Normal breath sounds.  Abdominal:     General: Bowel sounds are normal.     Palpations: Abdomen is soft.  Lymphadenopathy:     Comments: No palpable cervical, supraclavicular, axillary or inguinal adenopathy   Skin:    General: Skin is warm and dry.  Neurological:     General: No focal deficit present.     Mental Status: She is alert and oriented to person, place, and time.     Sensory: No sensory deficit.     Motor: No weakness.     No palpable bilateral axillary adenopathy.  No palpable bilateral breast masses   CMP Latest Ref Rng & Units 07/04/2018  Glucose 70 - 99 mg/dL -  BUN 6 - 23 mg/dL -  Creatinine 0.44 - 1.00 mg/dL 0.80  Sodium 135 - 145 mEq/L -  Potassium 3.5 - 5.1 mEq/L -  Chloride 96 - 112 mEq/L -  CO2 19 - 32 mEq/L -  Calcium 8.4 - 10.5 mg/dL -  Total Protein 6.0 - 8.3 g/dL -  Total Bilirubin 0.2 - 1.2 mg/dL -  Alkaline Phos 39 - 117 U/L -  AST 0 - 37 U/L -  ALT 0 - 35 U/L -   CBC Latest Ref Rng & Units 07/08/2018  WBC 4.0 - 10.5 K/uL 5.1  Hemoglobin 12.0 - 15.0 g/dL 11.1(L)  Hematocrit 36.0 - 46.0 % 36.3  Platelets 150 - 400 K/uL 180    No images are attached to the encounter.  Mr Lumbar Spine W Wo Contrast  Result Date: 07/04/2018 CLINICAL DATA:  Chronic right-sided low back pain without sciatica. Lumbar spondylosis. Pain right SI joint. No history of cancer. EXAM: MRI LUMBAR SPINE WITHOUT AND WITH CONTRAST TECHNIQUE: Multiplanar and multiecho pulse sequences of the lumbar spine were obtained without and  with intravenous contrast. CONTRAST:  7.5 mL Gadovist IV COMPARISON:  CT abdomen pelvis  01/02/2017, lumbar MRI 11/14/2009 FINDINGS: Segmentation:  Normal Alignment:  7 mm anterolisthesis L4-5.  Remaining alignment normal. Vertebrae: Multiple bone marrow lesions are seen compatible with metastatic disease. Pathologic fractures L2 and S1. Left pedicle lesion T11 Diffuse bone marrow lesion L1 with mild ventral epidural tumor Extensive tumor throughout the L2 vertebral body extending into the right pedicle. Moderate amount of ventral epidural tumor on the right with mild spinal stenosis. Tumor throughout the left L3 vertebral body without epidural tumor Tumor in the L4 vertebral body without epidural tumor Tumor throughout S1 vertebral body with pathologic fracture. No epidural tumor. Tumor in the iliac bone adjacent to the SI joint bilaterally. Conus medullaris and cauda equina: Conus extends to the L1-2 level. Conus and cauda equina appear normal. Paraspinal and other soft tissues: No paraspinous mass or adenopathy. Disc levels: L1-2: Ventral epidural tumor on the right with mild spinal stenosis. L2-3: Mild facet degeneration.  Negative for stenosis L3-4: Small central disc protrusion and facet degeneration. Negative for spinal stenosis L4-5: Postop laminectomy on the left. 7 mm anterolisthesis. Negative for stenosis L5-S1: Moderate facet degeneration bilaterally. Negative for stenosis. IMPRESSION: Widespread metastatic disease to bone. Pathologic fracture L2 with ventral epidural tumor on the right. Pathologic fracture S1 Lumbar degenerative changes above. Prior laminectomy on the left L4-5 with 7 mm anterolisthesis L4-5. These results were called by telephone at the time of interpretation on 07/04/2018 at 2:22 pm to [facility] PA , who verbally acknowledged these results. Electronically Signed   By: Franchot Gallo M.D.   On: 07/04/2018 14:22   Dg Hip Unilat With Pelvis 2-3 Views Right  Result Date: 06/11/2018 CLINICAL DATA:  RIGHT hip pain EXAM: DG HIP (WITH OR WITHOUT PELVIS) 2-3V RIGHT  COMPARISON:  None Correlation: CT chest abdomen and pelvis 01/02/2017 FINDINGS: Osseous mineralization normal. Hip joint spaces preserved. Minimal sclerosis adjacent to the SI joints bilaterally, unchanged from prior CT, SI joints preserved. No fracture, dislocation, or bone destruction. IMPRESSION: No acute RIGHT hip abnormalities. Electronically Signed   By: Lavonia Dana M.D.   On: 06/11/2018 18:13    Assessment and plan- Patient is a 68 y.o. female referred for abnormal MRI of the lumbar spine which shows diffuse bone metastases.  Unknown primary  I have reviewed MRI spine images independently and have also shown the images to the patient and discussed findings with the patient. Patient has evidence of metastatic disease especially involving the lumbar vertebra as well as S1 and the iliac bone.  I have discussed her case with Dr. Kathlene Cote and given that there is some concern involving the spinal cord at the level of L2 kyphoplasty would not be a good option.  I will therefore refer her to radiation oncology for palliative radiation to her spine.  Clinically she does not have any symptoms of cord compression.  Breast exam does not reveal any palpable breast mass.  She did have a left upper lobe lung nodule and given her strong history of smoking, we may be dealing with a lung primary.  I will proceed with a PET CT scan at this time to a certain the primary site as well as to determine what would be a feasible area to biopsy.  I will check a CBC with differential, CMP, myeloma panel and serum free light chains today.  I will tentatively see her back in 2 weeks time to discuss the results of the  biopsy and further management  Neoplasm related pain: I have prescribed her oxycodone 5 mg every 6 hours as needed for pain.  Patient knows to call us if her pain gets worse or if she develops any new signs and symptoms of bilateral lower extremity weakness or numbness.   Thank you for this kind referral and the  opportunity to participate in the care of this patient   Visit Diagnosis 1. Bone metastases ([place])   2. Neoplasm related pain     Dr. Randa Evens, MD, MPH [facility] [phone] 07/08/2018 1:45 PM

## 2018-07-08 NOTE — Progress Notes (Signed)
Pt here as new patient with bone met and no primary source. Patient is in no pain

## 2018-07-09 LAB — MULTIPLE MYELOMA PANEL, SERUM
Albumin SerPl Elph-Mcnc: 3.3 g/dL (ref 2.9–4.4)
Albumin/Glob SerPl: 1 (ref 0.7–1.7)
Alpha 1: 0.3 g/dL (ref 0.0–0.4)
Alpha2 Glob SerPl Elph-Mcnc: 1 g/dL (ref 0.4–1.0)
B-Globulin SerPl Elph-Mcnc: 0.9 g/dL (ref 0.7–1.3)
Gamma Glob SerPl Elph-Mcnc: 1.3 g/dL (ref 0.4–1.8)
Globulin, Total: 3.4 g/dL (ref 2.2–3.9)
IgA: 179 mg/dL (ref 87–352)
IgG (Immunoglobin G), Serum: 1138 mg/dL (ref 586–1602)
IgM (Immunoglobulin M), Srm: 470 mg/dL — ABNORMAL HIGH (ref 26–217)
Total Protein ELP: 6.7 g/dL (ref 6.0–8.5)

## 2018-07-09 LAB — KAPPA/LAMBDA LIGHT CHAINS
Kappa free light chain: 40.2 mg/L — ABNORMAL HIGH (ref 3.3–19.4)
Kappa, lambda light chain ratio: 1.27 (ref 0.26–1.65)
Lambda free light chains: 31.7 mg/L — ABNORMAL HIGH (ref 5.7–26.3)

## 2018-07-10 ENCOUNTER — Other Ambulatory Visit: Payer: Self-pay

## 2018-07-10 ENCOUNTER — Ambulatory Visit
Admission: RE | Admit: 2018-07-10 | Discharge: 2018-07-10 | Disposition: A | Discharge status: Home / Self Care | Payer: PPO | Source: Ambulatory Visit | Attending: Radiation Oncology | Admitting: Radiation Oncology

## 2018-07-10 ENCOUNTER — Encounter: Payer: Self-pay | Admitting: Radiation Oncology

## 2018-07-10 VITALS — Temp 97.4°F | Resp 16 | Wt 184.7 lb

## 2018-07-10 DIAGNOSIS — Z87891 Personal history of nicotine dependence: Secondary | ICD-10-CM | POA: Diagnosis not present

## 2018-07-10 DIAGNOSIS — F329 Major depressive disorder, single episode, unspecified: Secondary | ICD-10-CM | POA: Diagnosis not present

## 2018-07-10 DIAGNOSIS — E785 Hyperlipidemia, unspecified: Secondary | ICD-10-CM | POA: Insufficient documentation

## 2018-07-10 DIAGNOSIS — K219 Gastro-esophageal reflux disease without esophagitis: Secondary | ICD-10-CM | POA: Diagnosis not present

## 2018-07-10 DIAGNOSIS — C7951 Secondary malignant neoplasm of bone: Secondary | ICD-10-CM | POA: Diagnosis not present

## 2018-07-10 DIAGNOSIS — M4856XD Collapsed vertebra, not elsewhere classified, lumbar region, subsequent encounter for fracture with routine healing: Secondary | ICD-10-CM | POA: Diagnosis not present

## 2018-07-10 DIAGNOSIS — M129 Arthropathy, unspecified: Secondary | ICD-10-CM | POA: Diagnosis not present

## 2018-07-10 DIAGNOSIS — I10 Essential (primary) hypertension: Secondary | ICD-10-CM | POA: Diagnosis not present

## 2018-07-10 DIAGNOSIS — Z8041 Family history of malignant neoplasm of ovary: Secondary | ICD-10-CM | POA: Insufficient documentation

## 2018-07-10 DIAGNOSIS — C801 Malignant (primary) neoplasm, unspecified: Secondary | ICD-10-CM | POA: Insufficient documentation

## 2018-07-10 DIAGNOSIS — E669 Obesity, unspecified: Secondary | ICD-10-CM | POA: Diagnosis not present

## 2018-07-10 DIAGNOSIS — K7460 Unspecified cirrhosis of liver: Secondary | ICD-10-CM | POA: Insufficient documentation

## 2018-07-10 DIAGNOSIS — Z79899 Other long term (current) drug therapy: Secondary | ICD-10-CM | POA: Diagnosis not present

## 2018-07-10 NOTE — Consult Note (Deleted)
NEW PATIENT EVALUATION  Name: Anne [name]  MRN: [id number]  Date:   07/10/2018     DOB: Dec 12, 1950   This 68 y.o. female patient presents to the clinic for initial evaluation of palliative radiation therapy to lumbar spine and patient with known metastatic disease as yet no known primary probably lung cancer.  REFERRING PHYSICIAN: Leone Haven, MD  CHIEF COMPLAINT:  Chief Complaint  Patient presents with  . Cancer    Initial consult bone mets    DIAGNOSIS: The encounter diagnosis was Bone metastases ([place]).   PREVIOUS INVESTIGATIONS:  MRI scans reviewed Clinical notes reviewed PET CT scan ordered  HPI: Patient is a 68 year old female who has presented with a several month history of increasing lower back pain.  She underwent an MRI scan of her lumbar spine on May 29 showing widespread metastatic disease with a pathologic fracture of L2 and ventral epidural tumor on the right there is also a pathologic fracture of S1.  She is having considerable lower back pain some difficulty ambulating there is no focal neurologic deficit.  Patient has multiple medical comorbidities including hypertension hyperlipidemia obesity and cirrhosis of the liver.  She has 150-pack-year smoking history quit smoking 5 years prior.  A previous CT scan back in 2018 showed a 5 mm lung nodule in the left lower lobe.  Plan is to have a PET/CT to try to identify area of primary tumor or area viable for tissue biopsy.  She is seen today for radiation oncology opinion regarding palliation.  PLANNED TREATMENT REGIMEN: Palliative radiation therapy to lumbar spine down to including SI joints  PAST MEDICAL HISTORY:  has a past medical history of Arthritis, Cirrhosis ([place]), Depression, GERD (gastroesophageal reflux disease), Hyperlipidemia, Hypertension, and Metastatic bone cancer ([place]).    PAST SURGICAL HISTORY:  Past Surgical History:  Procedure Laterality Date  . APPENDECTOMY  1971  . CHOLECYSTECTOMY  1971  .  COLONOSCOPY WITH PROPOFOL N/A 11/15/2016   Procedure: COLONOSCOPY WITH PROPOFOL;  Surgeon: Jonathon Bellows, MD;  Location: [facility] ENDOSCOPY;  Service: Gastroenterology;  Laterality: N/A;  . ESOPHAGOGASTRODUODENOSCOPY (EGD) WITH PROPOFOL N/A 01/07/2017   Procedure: ESOPHAGOGASTRODUODENOSCOPY (EGD) WITH PROPOFOL;  Surgeon: Jonathon Bellows, MD;  Location: [facility] ENDOSCOPY;  Service: Gastroenterology;  Laterality: N/A;  . LAPAROSCOPY N/A 03/01/2017   Procedure: LAPAROSCOPY DIAGNOSTIC;  Surgeon: Robert Bellow, MD;  Location: [facility] ORS;  Service: General;  Laterality: N/A;  . TONSILECTOMY, ADENOIDECTOMY, BILATERAL MYRINGOTOMY [place]  . TONSILLECTOMY    . VENTRAL HERNIA REPAIR N/A 03/01/2017   10 x 14 CM Ventralight ST mesh, intraperitoneal location.   . VENTRAL HERNIA REPAIR N/A 03/01/2017   Procedure: HERNIA REPAIR VENTRAL ADULT;  Surgeon: Robert Bellow, MD;  Location: [facility] ORS;  Service: General;  Laterality: N/A;    FAMILY HISTORY: family history includes Heart disease in her father; Hypertension in her mother; Ovarian cancer in her sister; Ovarian cancer (age of onset: 34) in her mother; Stroke in her father.  SOCIAL HISTORY:  reports that she quit smoking about 6 years ago. She has never used smokeless tobacco. She reports that she does not drink alcohol or use drugs.  ALLERGIES: Bee venom  MEDICATIONS:  Current Outpatient Medications  Medication Sig Dispense Refill  . buPROPion (WELLBUTRIN XL) 300 MG 24 hr tablet Take 1 tablet (300 mg total) by mouth daily. 90 tablet 1  . escitalopram (LEXAPRO) 10 MG tablet Take 1 tablet (10 mg total) by mouth daily. 90 tablet 1  . ibuprofen (  ADVIL,MOTRIN) 200 MG tablet Take 400 mg by mouth every 8 (eight) hours as needed (for headaches).    . Multiple Vitamins-Minerals (PRESERVISION AREDS 2) CAPS Take by mouth.    . oxyCODONE (OXY IR/ROXICODONE) 5 MG immediate release tablet Take 1 tablet (5 mg total) by mouth every 6 (six) hours as needed for severe  pain. 120 tablet 0  . pantoprazole (PROTONIX) 40 MG tablet TAKE 1 TABLET BY MOUTH TWICE DAILY BEFORE A MEAL 180 tablet 0  . rosuvastatin (CRESTOR) 20 MG tablet TAKE 1 TABLET BY MOUTH ONCE DAILY 90 tablet 3   No current facility-administered medications for this encounter.     ECOG PERFORMANCE STATUS:  1 - Symptomatic but completely ambulatory  REVIEW OF SYSTEMS: Patient denies any weight loss, fatigue, weakness, fever, chills or night sweats. Patient denies any loss of vision, blurred vision. Patient denies any ringing  of the ears or hearing loss. No irregular heartbeat. Patient denies heart murmur or history of fainting. Patient denies any chest pain or pain radiating to her upper extremities. Patient denies any shortness of breath, difficulty breathing at night, cough or hemoptysis. Patient denies any swelling in the lower legs. Patient denies any nausea vomiting, vomiting of blood, or coffee ground material in the vomitus. Patient denies any stomach pain. Patient states has had normal bowel movements no significant constipation or diarrhea. Patient denies any dysuria, hematuria or significant nocturia. Patient denies any problems walking, swelling in the joints or loss of balance. Patient denies any skin changes, loss of hair or loss of weight. Patient denies any excessive worrying or anxiety or significant depression. Patient denies any problems with insomnia. Patient denies excessive thirst, polyuria, polydipsia. Patient denies any swollen glands, patient denies easy bruising or easy bleeding. Patient denies any recent infections, allergies or URI. Patient "s visual fields have not changed significantly in recent time.   PHYSICAL EXAM: Temp (!) 97.4 F (36.3 C) (Tympanic)   Resp 16   Wt 184 lb 11.9 oz (83.8 kg)   BMI 30.74 kg/m  Motor sensory and DTR levels are equal and symmetric in lower extremities palpation of the lumbar spine does elicit pain range of motion of her lower extremities  does not elicit pain.  Proprioception is intact.  Well-developed well-nourished patient in NAD. HEENT reveals PERLA, EOMI, discs not visualized.  Oral cavity is clear. No oral mucosal lesions are identified. Neck is clear without evidence of cervical or supraclavicular adenopathy. Lungs are clear to A&P. Cardiac examination is essentially unremarkable with regular rate and rhythm without murmur rub or thrill. Abdomen is benign with no organomegaly or masses noted. Motor sensory and DTR levels are equal and symmetric in the upper and lower extremities. Cranial nerves II through XII are grossly intact. Proprioception is intact. No peripheral adenopathy or edema is identified. No motor or sensory levels are noted. Crude visual fields are within normal range.  LABORATORY DATA: No current biopsy for review    RADIOLOGY RESULTS: MRI scan reviewed PET CT scan has been ordered   IMPRESSION: Widespread metastatic bone disease in patient of yet unknown primary probably primary bronchogenic carcinoma in 53-year-old female  PLAN: At this time like to go ahead with palliative radiation therapy to the lumbar spine.  I would plan on delivering 3000 cGy in 10 fractions including her lumbar spine and SI joints.  Based on the size of the field I might have to go to 15 fractions to avoid toxicity to her small bowel.  Risks and benefits  of treatment including diarrhea fatigue alteration of blood count skin reaction all were discussed in detail.  I have personally set up and ordered CT simulation.  Patient comprehends my treatment plan well.  I would like to take this opportunity to thank you for allowing me to participate in the care of your patient.Noreene Filbert, MD

## 2018-07-10 NOTE — Progress Notes (Addendum)
NEW PATIENT EVALUATION  Name: Anne [name]   MRN: [id number]        Date:   07/10/2018     DOB: [birth year]   This 68 y.o. female patient presents to the clinic for initial evaluation of palliative radiation therapy to lumbar spine and patient with known metastatic disease as yet no known primary probably lung cancer.  REFERRING PHYSICIAN: Leone Haven, MD  CHIEF COMPLAINT:      Chief Complaint  Patient presents with  . Cancer    Initial consult bone mets    DIAGNOSIS: The encounter diagnosis was Bone metastases ([place]).   PREVIOUS INVESTIGATIONS:  MRI scans reviewed Clinical notes reviewed PET CT scan ordered  HPI: Patient is a 68 year old female who has presented with a several month history of increasing lower back pain.  She underwent an MRI scan of her lumbar spine on May 29 showing widespread metastatic disease with a pathologic fracture of L2 and ventral epidural tumor on the right there is also a pathologic fracture of S1.  She is having considerable lower back pain some difficulty ambulating there is no focal neurologic deficit.  Patient has multiple medical comorbidities including hypertension hyperlipidemia obesity and cirrhosis of the liver.  She has 150-pack-year smoking history quit smoking 5 years prior.  A previous CT scan back in 2018 showed a 5 mm lung nodule in the left lower lobe.  Plan is to have a PET/CT to try to identify area of primary tumor or area viable for tissue biopsy.  She is seen today for radiation oncology opinion regarding palliation.  PLANNED TREATMENT REGIMEN: Palliative radiation therapy to lumbar spine down to including SI joints  PAST MEDICAL HISTORY:  has a past medical history of Arthritis, Cirrhosis ([place]), Depression, GERD (gastroesophageal reflux disease), Hyperlipidemia, Hypertension, and Metastatic bone cancer ([place]).    PAST SURGICAL HISTORY:       Past Surgical History:  Procedure Laterality Date  . APPENDECTOMY   1971  . CHOLECYSTECTOMY  1971  . COLONOSCOPY WITH PROPOFOL N/A 11/15/2016   Procedure: COLONOSCOPY WITH PROPOFOL;  Surgeon: Jonathon Bellows, MD;  Location: [facility] ENDOSCOPY;  Service: Gastroenterology;  Laterality: N/A;  . ESOPHAGOGASTRODUODENOSCOPY (EGD) WITH PROPOFOL N/A 01/07/2017   Procedure: ESOPHAGOGASTRODUODENOSCOPY (EGD) WITH PROPOFOL;  Surgeon: Jonathon Bellows, MD;  Location: [facility] ENDOSCOPY;  Service: Gastroenterology;  Laterality: N/A;  . LAPAROSCOPY N/A 03/01/2017   Procedure: LAPAROSCOPY DIAGNOSTIC;  Surgeon: Robert Bellow, MD;  Location: [facility] ORS;  Service: General;  Laterality: N/A;  . TONSILECTOMY, ADENOIDECTOMY, BILATERAL MYRINGOTOMY [place]  . TONSILLECTOMY    . VENTRAL HERNIA REPAIR N/A 03/01/2017   10 x 14 CM Ventralight ST mesh, intraperitoneal location.   . VENTRAL HERNIA REPAIR N/A 03/01/2017   Procedure: HERNIA REPAIR VENTRAL ADULT;  Surgeon: Robert Bellow, MD;  Location: [facility] ORS;  Service: General;  Laterality: N/A;    FAMILY HISTORY: family history includes Heart disease in her father; Hypertension in her mother; Ovarian cancer in her sister; Ovarian cancer (age of onset: 51) in her mother; Stroke in her father.  SOCIAL HISTORY:  reports that she quit smoking about 6 years ago. She has never used smokeless tobacco. She reports that she does not drink alcohol or use drugs.  ALLERGIES: Bee venom  MEDICATIONS:        Current Outpatient Medications  Medication Sig Dispense Refill  . buPROPion (WELLBUTRIN XL) 300 MG 24 hr tablet Take 1 tablet (300 mg total) by mouth daily. 90 tablet 1  .  escitalopram (LEXAPRO) 10 MG tablet Take 1 tablet (10 mg total) by mouth daily. 90 tablet 1  . ibuprofen (ADVIL,MOTRIN) 200 MG tablet Take 400 mg by mouth every 8 (eight) hours as needed (for headaches).    . Multiple Vitamins-Minerals (PRESERVISION AREDS 2) CAPS Take by mouth.    . oxyCODONE (OXY IR/ROXICODONE) 5 MG immediate release tablet Take 1 tablet (5 mg  total) by mouth every 6 (six) hours as needed for severe pain. 120 tablet 0  . pantoprazole (PROTONIX) 40 MG tablet TAKE 1 TABLET BY MOUTH TWICE DAILY BEFORE A MEAL 180 tablet 0  . rosuvastatin (CRESTOR) 20 MG tablet TAKE 1 TABLET BY MOUTH ONCE DAILY 90 tablet 3   No current facility-administered medications for this encounter.     ECOG PERFORMANCE STATUS:  1 - Symptomatic but completely ambulatory  REVIEW OF SYSTEMS: Patient denies any weight loss, fatigue, weakness, fever, chills or night sweats. Patient denies any loss of vision, blurred vision. Patient denies any ringing  of the ears or hearing loss. No irregular heartbeat. Patient denies heart murmur or history of fainting. Patient denies any chest pain or pain radiating to her upper extremities. Patient denies any shortness of breath, difficulty breathing at night, cough or hemoptysis. Patient denies any swelling in the lower legs. Patient denies any nausea vomiting, vomiting of blood, or coffee ground material in the vomitus. Patient denies any stomach pain. Patient states has had normal bowel movements no significant constipation or diarrhea. Patient denies any dysuria, hematuria or significant nocturia. Patient denies any problems walking, swelling in the joints or loss of balance. Patient denies any skin changes, loss of hair or loss of weight. Patient denies any excessive worrying or anxiety or significant depression. Patient denies any problems with insomnia. Patient denies excessive thirst, polyuria, polydipsia. Patient denies any swollen glands, patient denies easy bruising or easy bleeding. Patient denies any recent infections, allergies or URI. Patient "s visual fields have not changed significantly in recent time.   PHYSICAL EXAM: Temp (!) 97.4 F (36.3 C) (Tympanic)   Resp 16   Wt 184 lb 11.9 oz (83.8 kg)   BMI 30.74 kg/m  Motor sensory and DTR levels are equal and symmetric in lower extremities palpation of the lumbar spine  does elicit pain range of motion of her lower extremities does not elicit pain.  Proprioception is intact.  Well-developed well-nourished patient in NAD. HEENT reveals PERLA, EOMI, discs not visualized.  Oral cavity is clear. No oral mucosal lesions are identified. Neck is clear without evidence of cervical or supraclavicular adenopathy. Lungs are clear to A&P. Cardiac examination is essentially unremarkable with regular rate and rhythm without murmur rub or thrill. Abdomen is benign with no organomegaly or masses noted. Motor sensory and DTR levels are equal and symmetric in the upper and lower extremities. Cranial nerves II through XII are grossly intact. Proprioception is intact. No peripheral adenopathy or edema is identified. No motor or sensory levels are noted. Crude visual fields are within normal range.  LABORATORY DATA: No current biopsy for review    RADIOLOGY RESULTS: MRI scan reviewed PET CT scan has been ordered   IMPRESSION: Widespread metastatic bone disease in patient of yet unknown primary probably primary bronchogenic carcinoma in 43-year-old female  PLAN: At this time like to go ahead with palliative radiation therapy to the lumbar spine.  I would plan on delivering 3000 cGy in 10 fractions including her lumbar spine and SI joints.  Based on the size of the  field I might have to go to 15 fractions to avoid toxicity to her small bowel.  Risks and benefits of treatment including diarrhea fatigue alteration of blood count skin reaction all were discussed in detail.  I have personally set up and ordered CT simulation.  Patient comprehends my treatment plan well.  I would like to take this opportunity to thank you for allowing me to participate in the care of your patient.Noreene Filbert, MD     NEW PATIENT EVALUATION  Name: Anne [name]   MRN: [id number]        Date:   07/10/2018     DOB: 09-14-1950   This 68 y.o. female patient presents to the clinic for  initial evaluation of palliative radiation therapy to lumbar spine and patient with known metastatic disease as yet no known primary probably lung cancer.  REFERRING PHYSICIAN: Leone Haven, MD  CHIEF COMPLAINT:      Chief Complaint  Patient presents with  . Cancer    Initial consult bone mets    DIAGNOSIS: The encounter diagnosis was Bone metastases ([place]).   PREVIOUS INVESTIGATIONS:  MRI scans reviewed Clinical notes reviewed PET CT scan ordered  HPI: Patient is a 68 year old female who has presented with a several month history of increasing lower back pain.  She underwent an MRI scan of her lumbar spine on May 29 showing widespread metastatic disease with a pathologic fracture of L2 and ventral epidural tumor on the right there is also a pathologic fracture of S1.  She is having considerable lower back pain some difficulty ambulating there is no focal neurologic deficit.  Patient has multiple medical comorbidities including hypertension hyperlipidemia obesity and cirrhosis of the liver.  She has 150-pack-year smoking history quit smoking 5 years prior.  A previous CT scan back in 2018 showed a 5 mm lung nodule in the left lower lobe.  Plan is to have a PET/CT to try to identify area of primary tumor or area viable for tissue biopsy.  She is seen today for radiation oncology opinion regarding palliation.  PLANNED TREATMENT REGIMEN: Palliative radiation therapy to lumbar spine down to including SI joints  PAST MEDICAL HISTORY:  has a past medical history of Arthritis, Cirrhosis ([place]), Depression, GERD (gastroesophageal reflux disease), Hyperlipidemia, Hypertension, and Metastatic bone cancer ([place]).    PAST SURGICAL HISTORY:       Past Surgical History:  Procedure Laterality Date  . APPENDECTOMY  1971  . CHOLECYSTECTOMY  1971  . COLONOSCOPY WITH PROPOFOL N/A 11/15/2016   Procedure: COLONOSCOPY WITH PROPOFOL;  Surgeon: Jonathon Bellows, MD;  Location: [facility] ENDOSCOPY;   Service: Gastroenterology;  Laterality: N/A;  . ESOPHAGOGASTRODUODENOSCOPY (EGD) WITH PROPOFOL N/A 01/07/2017   Procedure: ESOPHAGOGASTRODUODENOSCOPY (EGD) WITH PROPOFOL;  Surgeon: Jonathon Bellows, MD;  Location: [facility] ENDOSCOPY;  Service: Gastroenterology;  Laterality: N/A;  . LAPAROSCOPY N/A 03/01/2017   Procedure: LAPAROSCOPY DIAGNOSTIC;  Surgeon: Robert Bellow, MD;  Location: [facility] ORS;  Service: General;  Laterality: N/A;  . TONSILECTOMY, ADENOIDECTOMY, BILATERAL MYRINGOTOMY [place]  . TONSILLECTOMY    . VENTRAL HERNIA REPAIR N/A 03/01/2017   10 x 14 CM Ventralight ST mesh, intraperitoneal location.   . VENTRAL HERNIA REPAIR N/A 03/01/2017   Procedure: HERNIA REPAIR VENTRAL ADULT;  Surgeon: Robert Bellow, MD;  Location: [facility] ORS;  Service: General;  Laterality: N/A;    FAMILY HISTORY: family history includes Heart disease in her father; Hypertension in her mother; Ovarian cancer in her sister; Ovarian cancer (  age of onset: 58) in her mother; Stroke in her father.  SOCIAL HISTORY:  reports that she quit smoking about 6 years ago. She has never used smokeless tobacco. She reports that she does not drink alcohol or use drugs.  ALLERGIES: Bee venom  MEDICATIONS:        Current Outpatient Medications  Medication Sig Dispense Refill  . buPROPion (WELLBUTRIN XL) 300 MG 24 hr tablet Take 1 tablet (300 mg total) by mouth daily. 90 tablet 1  . escitalopram (LEXAPRO) 10 MG tablet Take 1 tablet (10 mg total) by mouth daily. 90 tablet 1  . ibuprofen (ADVIL,MOTRIN) 200 MG tablet Take 400 mg by mouth every 8 (eight) hours as needed (for headaches).    . Multiple Vitamins-Minerals (PRESERVISION AREDS 2) CAPS Take by mouth.    . oxyCODONE (OXY IR/ROXICODONE) 5 MG immediate release tablet Take 1 tablet (5 mg total) by mouth every 6 (six) hours as needed for severe pain. 120 tablet 0  . pantoprazole (PROTONIX) 40 MG tablet TAKE 1 TABLET BY MOUTH TWICE DAILY BEFORE A MEAL 180  tablet 0  . rosuvastatin (CRESTOR) 20 MG tablet TAKE 1 TABLET BY MOUTH ONCE DAILY 90 tablet 3   No current facility-administered medications for this encounter.     ECOG PERFORMANCE STATUS:  1 - Symptomatic but completely ambulatory  REVIEW OF SYSTEMS: Patient denies any weight loss, fatigue, weakness, fever, chills or night sweats. Patient denies any loss of vision, blurred vision. Patient denies any ringing  of the ears or hearing loss. No irregular heartbeat. Patient denies heart murmur or history of fainting. Patient denies any chest pain or pain radiating to her upper extremities. Patient denies any shortness of breath, difficulty breathing at night, cough or hemoptysis. Patient denies any swelling in the lower legs. Patient denies any nausea vomiting, vomiting of blood, or coffee ground material in the vomitus. Patient denies any stomach pain. Patient states has had normal bowel movements no significant constipation or diarrhea. Patient denies any dysuria, hematuria or significant nocturia. Patient denies any problems walking, swelling in the joints or loss of balance. Patient denies any skin changes, loss of hair or loss of weight. Patient denies any excessive worrying or anxiety or significant depression. Patient denies any problems with insomnia. Patient denies excessive thirst, polyuria, polydipsia. Patient denies any swollen glands, patient denies easy bruising or easy bleeding. Patient denies any recent infections, allergies or URI. Patient "s visual fields have not changed significantly in recent time.   PHYSICAL EXAM: Temp (!) 97.4 F (36.3 C) (Tympanic)   Resp 16   Wt 184 lb 11.9 oz (83.8 kg)   BMI 30.74 kg/m  Motor sensory and DTR levels are equal and symmetric in lower extremities palpation of the lumbar spine does elicit pain range of motion of her lower extremities does not elicit pain.  Proprioception is intact.  Well-developed well-nourished patient in NAD. HEENT reveals  PERLA, EOMI, discs not visualized.  Oral cavity is clear. No oral mucosal lesions are identified. Neck is clear without evidence of cervical or supraclavicular adenopathy. Lungs are clear to A&P. Cardiac examination is essentially unremarkable with regular rate and rhythm without murmur rub or thrill. Abdomen is benign with no organomegaly or masses noted. Motor sensory and DTR levels are equal and symmetric in the upper and lower extremities. Cranial nerves II through XII are grossly intact. Proprioception is intact. No peripheral adenopathy or edema is identified. No motor or sensory levels are noted. Crude visual fields are within  normal range.  LABORATORY DATA: No current biopsy for review    RADIOLOGY RESULTS: MRI scan reviewed PET CT scan has been ordered   IMPRESSION: Widespread metastatic bone disease in patient of yet unknown primary probably primary bronchogenic carcinoma in 32-year-old female  PLAN: At this time like to go ahead with palliative radiation therapy to the lumbar spine.  I would plan on delivering 3000 cGy in 10 fractions including her lumbar spine and SI joints.  Based on the size of the field I might have to go to 15 fractions to avoid toxicity to her small bowel.  Risks and benefits of treatment including diarrhea fatigue alteration of blood count skin reaction all were discussed in detail.  I have personally set up and ordered CT simulation.  Patient comprehends my treatment plan well.  I would like to take this opportunity to thank you for allowing me to participate in the care of your patient.Noreene Filbert, MD

## 2018-07-10 NOTE — Consult Note (Signed)
NEW PATIENT EVALUATION  Name: Anne [name]   MRN: [id number]        Date:   07/10/2018     DOB: January 22, 1951   This 68 y.o. female patient presents to the clinic for initial evaluation of palliative radiation therapy to lumbar spine and patient with known metastatic disease as yet no known primary probably lung cancer.  REFERRING PHYSICIAN: Leone Haven, MD  CHIEF COMPLAINT:      Chief Complaint  Patient presents with  . Cancer    Initial consult bone mets    DIAGNOSIS: The encounter diagnosis was Bone metastases ([place]).   PREVIOUS INVESTIGATIONS:  MRI scans reviewed Clinical notes reviewed PET CT scan ordered  HPI: Patient is a 68 year old female who has presented with a several month history of increasing lower back pain.  She underwent an MRI scan of her lumbar spine on May 29 showing widespread metastatic disease with a pathologic fracture of L2 and ventral epidural tumor on the right there is also a pathologic fracture of S1.  She is having considerable lower back pain some difficulty ambulating there is no focal neurologic deficit.  Patient has multiple medical comorbidities including hypertension hyperlipidemia obesity and cirrhosis of the liver.  She has 150-pack-year smoking history quit smoking 5 years prior.  A previous CT scan back in 2018 showed a 5 mm lung nodule in the left lower lobe.  Plan is to have a PET/CT to try to identify area of primary tumor or area viable for tissue biopsy.  She is seen today for radiation oncology opinion regarding palliation.  PLANNED TREATMENT REGIMEN: Palliative radiation therapy to lumbar spine down to including SI joints  PAST MEDICAL HISTORY:  has a past medical history of Arthritis, Cirrhosis ([place]), Depression, GERD (gastroesophageal reflux disease), Hyperlipidemia, Hypertension, and Metastatic bone cancer ([place]).    PAST SURGICAL HISTORY:       Past Surgical History:  Procedure Laterality Date  . APPENDECTOMY  1971   . CHOLECYSTECTOMY  1971  . COLONOSCOPY WITH PROPOFOL N/A 11/15/2016   Procedure: COLONOSCOPY WITH PROPOFOL;  Surgeon: Jonathon Bellows, MD;  Location[facility] ENDOSCOPY;  Service: Gastroenterology;  Laterality: N/A;  . ESOPHAGOGASTRODUODENOSCOPY (EGD) WITH PROPOFOL N/A 01/07/2017   Procedure: ESOPHAGOGASTRODUODENOSCOPY (EGD) WITH PROPOFOL;  Surgeon: Jonathon Bellows, MD;  Location: [facility] ENDOSCOPY;  Service: Gastroenterology;  Laterality: N/A;  . LAPAROSCOPY N/A 03/01/2017   Procedure: LAPAROSCOPY DIAGNOSTIC;  Surgeon: Robert Bellow, MD;  Location: [facility] ORS;  Service: General;  Laterality: N/A;  . TONSILECTOMY, ADENOIDECTOMY, BILATERAL MYRINGOTOMY [place]  . TONSILLECTOMY    . VENTRAL HERNIA REPAIR N/A 03/01/2017   10 x 14 CM Ventralight ST mesh, intraperitoneal location.   . VENTRAL HERNIA REPAIR N/A 03/01/2017   Procedure: HERNIA REPAIR VENTRAL ADULT;  Surgeon: Robert Bellow, MD;  Location: [facility] ORS;  Service: General;  Laterality: N/A;    FAMILY HISTORY: family history includes Heart disease in her father; Hypertension in her mother; Ovarian cancer in her sister; Ovarian cancer (age of onset: 89) in her mother; Stroke in her father.  SOCIAL HISTORY:  reports that she quit smoking about 6 years ago. She has never used smokeless tobacco. She reports that she does not drink alcohol or use drugs.  ALLERGIES: Bee venom  MEDICATIONS:        Current Outpatient Medications  Medication Sig Dispense Refill  . buPROPion (WELLBUTRIN XL) 300 MG 24 hr tablet Take 1 tablet (300 mg total) by mouth daily. 90 tablet 1  .  escitalopram (LEXAPRO) 10 MG tablet Take 1 tablet (10 mg total) by mouth daily. 90 tablet 1  . ibuprofen (ADVIL,MOTRIN) 200 MG tablet Take 400 mg by mouth every 8 (eight) hours as needed (for headaches).    . Multiple Vitamins-Minerals (PRESERVISION AREDS 2) CAPS Take by mouth.    . oxyCODONE (OXY IR/ROXICODONE) 5 MG immediate release tablet Take 1 tablet (5 mg  total) by mouth every 6 (six) hours as needed for severe pain. 120 tablet 0  . pantoprazole (PROTONIX) 40 MG tablet TAKE 1 TABLET BY MOUTH TWICE DAILY BEFORE A MEAL 180 tablet 0  . rosuvastatin (CRESTOR) 20 MG tablet TAKE 1 TABLET BY MOUTH ONCE DAILY 90 tablet 3   No current facility-administered medications for this encounter.     ECOG PERFORMANCE STATUS:  1 - Symptomatic but completely ambulatory  REVIEW OF SYSTEMS: Patient denies any weight loss, fatigue, weakness, fever, chills or night sweats. Patient denies any loss of vision, blurred vision. Patient denies any ringing  of the ears or hearing loss. No irregular heartbeat. Patient denies heart murmur or history of fainting. Patient denies any chest pain or pain radiating to her upper extremities. Patient denies any shortness of breath, difficulty breathing at night, cough or hemoptysis. Patient denies any swelling in the lower legs. Patient denies any nausea vomiting, vomiting of blood, or coffee ground material in the vomitus. Patient denies any stomach pain. Patient states has had normal bowel movements no significant constipation or diarrhea. Patient denies any dysuria, hematuria or significant nocturia. Patient denies any problems walking, swelling in the joints or loss of balance. Patient denies any skin changes, loss of hair or loss of weight. Patient denies any excessive worrying or anxiety or significant depression. Patient denies any problems with insomnia. Patient denies excessive thirst, polyuria, polydipsia. Patient denies any swollen glands, patient denies easy bruising or easy bleeding. Patient denies any recent infections, allergies or URI. Patient "s visual fields have not changed significantly in recent time.   PHYSICAL EXAM: Temp (!) 97.4 F (36.3 C) (Tympanic)   Resp 16   Wt 184 lb 11.9 oz (83.8 kg)   BMI 30.74 kg/m  Motor sensory and DTR levels are equal and symmetric in lower extremities palpation of the lumbar spine  does elicit pain range of motion of her lower extremities does not elicit pain.  Proprioception is intact.  Well-developed well-nourished patient in NAD. HEENT reveals PERLA, EOMI, discs not visualized.  Oral cavity is clear. No oral mucosal lesions are identified. Neck is clear without evidence of cervical or supraclavicular adenopathy. Lungs are clear to A&P. Cardiac examination is essentially unremarkable with regular rate and rhythm without murmur rub or thrill. Abdomen is benign with no organomegaly or masses noted. Motor sensory and DTR levels are equal and symmetric in the upper and lower extremities. Cranial nerves II through XII are grossly intact. Proprioception is intact. No peripheral adenopathy or edema is identified. No motor or sensory levels are noted. Crude visual fields are within normal range.  LABORATORY DATA: No current biopsy for review    RADIOLOGY RESULTS: MRI scan reviewed PET CT scan has been ordered   IMPRESSION: Widespread metastatic bone disease in patient of yet unknown primary probably primary bronchogenic carcinoma in 52-year-old female  PLAN: At this time like to go ahead with palliative radiation therapy to the lumbar spine.  I would plan on delivering 3000 cGy in 10 fractions including her lumbar spine and SI joints.  Based on the size of the  field I might have to go to 15 fractions to avoid toxicity to her small bowel.  Risks and benefits of treatment including diarrhea fatigue alteration of blood count skin reaction all were discussed in detail.  I have personally set up and ordered CT simulation.  Patient comprehends my treatment plan well.  I would like to take this opportunity to thank you for allowing me to participate in the care of your patient.Noreene Filbert, MD     NEW PATIENT EVALUATION  Name: Anne [name]   MRN: [id number]        Date:   07/10/2018     DOB: 23-Feb-1950   This 68 y.o. female patient presents to the clinic for  initial evaluation of palliative radiation therapy to lumbar spine and patient with known metastatic disease as yet no known primary probably lung cancer.  REFERRING PHYSICIAN: Leone Haven, MD  CHIEF COMPLAINT:      Chief Complaint  Patient presents with  . Cancer    Initial consult bone mets    DIAGNOSIS: The encounter diagnosis was Bone metastases ([place]).   PREVIOUS INVESTIGATIONS:  MRI scans reviewed Clinical notes reviewed PET CT scan ordered  HPI: Patient is a 68 year old female who has presented with a several month history of increasing lower back pain.  She underwent an MRI scan of her lumbar spine on May 29 showing widespread metastatic disease with a pathologic fracture of L2 and ventral epidural tumor on the right there is also a pathologic fracture of S1.  She is having considerable lower back pain some difficulty ambulating there is no focal neurologic deficit.  Patient has multiple medical comorbidities including hypertension hyperlipidemia obesity and cirrhosis of the liver.  She has 150-pack-year smoking history quit smoking 5 years prior.  A previous CT scan back in 2018 showed a 5 mm lung nodule in the left lower lobe.  Plan is to have a PET/CT to try to identify area of primary tumor or area viable for tissue biopsy.  She is seen today for radiation oncology opinion regarding palliation.  PLANNED TREATMENT REGIMEN: Palliative radiation therapy to lumbar spine down to including SI joints  PAST MEDICAL HISTORY:  has a past medical history of Arthritis, Cirrhosis ([place]), Depression, GERD (gastroesophageal reflux disease), Hyperlipidemia, Hypertension, and Metastatic bone cancer ([place]).    PAST SURGICAL HISTORY:       Past Surgical History:  Procedure Laterality Date  . APPENDECTOMY  1971  . CHOLECYSTECTOMY  1971  . COLONOSCOPY WITH PROPOFOL N/A 11/15/2016   Procedure: COLONOSCOPY WITH PROPOFOL;  Surgeon: Jonathon Bellows, MD;  Location: [facility] ENDOSCOPY;   Service: Gastroenterology;  Laterality: N/A;  . ESOPHAGOGASTRODUODENOSCOPY (EGD) WITH PROPOFOL N/A 01/07/2017   Procedure: ESOPHAGOGASTRODUODENOSCOPY (EGD) WITH PROPOFOL;  Surgeon: Jonathon Bellows, MD;  Location: [facility] ENDOSCOPY;  Service: Gastroenterology;  Laterality: N/A;  . LAPAROSCOPY N/A 03/01/2017   Procedure: LAPAROSCOPY DIAGNOSTIC;  Surgeon: Robert Bellow, MD;  Location: [facility] ORS;  Service: General;  Laterality: N/A;  . TONSILECTOMY, ADENOIDECTOMY, BILATERAL MYRINGOTOMY [place]  . TONSILLECTOMY    . VENTRAL HERNIA REPAIR N/A 03/01/2017   10 x 14 CM Ventralight ST mesh, intraperitoneal location.   . VENTRAL HERNIA REPAIR N/A 03/01/2017   Procedure: HERNIA REPAIR VENTRAL ADULT;  Surgeon: Robert Bellow, MD;  Location: [facility] ORS;  Service: General;  Laterality: N/A;    FAMILY HISTORY: family history includes Heart disease in her father; Hypertension in her mother; Ovarian cancer in her sister; Ovarian cancer (  age of onset: 30) in her mother; Stroke in her father.  SOCIAL HISTORY:  reports that she quit smoking about 6 years ago. She has never used smokeless tobacco. She reports that she does not drink alcohol or use drugs.  ALLERGIES: Bee venom  MEDICATIONS:        Current Outpatient Medications  Medication Sig Dispense Refill  . buPROPion (WELLBUTRIN XL) 300 MG 24 hr tablet Take 1 tablet (300 mg total) by mouth daily. 90 tablet 1  . escitalopram (LEXAPRO) 10 MG tablet Take 1 tablet (10 mg total) by mouth daily. 90 tablet 1  . ibuprofen (ADVIL,MOTRIN) 200 MG tablet Take 400 mg by mouth every 8 (eight) hours as needed (for headaches).    . Multiple Vitamins-Minerals (PRESERVISION AREDS 2) CAPS Take by mouth.    . oxyCODONE (OXY IR/ROXICODONE) 5 MG immediate release tablet Take 1 tablet (5 mg total) by mouth every 6 (six) hours as needed for severe pain. 120 tablet 0  . pantoprazole (PROTONIX) 40 MG tablet TAKE 1 TABLET BY MOUTH TWICE DAILY BEFORE A MEAL 180  tablet 0  . rosuvastatin (CRESTOR) 20 MG tablet TAKE 1 TABLET BY MOUTH ONCE DAILY 90 tablet 3   No current facility-administered medications for this encounter.     ECOG PERFORMANCE STATUS:  1 - Symptomatic but completely ambulatory  REVIEW OF SYSTEMS: Patient denies any weight loss, fatigue, weakness, fever, chills or night sweats. Patient denies any loss of vision, blurred vision. Patient denies any ringing  of the ears or hearing loss. No irregular heartbeat. Patient denies heart murmur or history of fainting. Patient denies any chest pain or pain radiating to her upper extremities. Patient denies any shortness of breath, difficulty breathing at night, cough or hemoptysis. Patient denies any swelling in the lower legs. Patient denies any nausea vomiting, vomiting of blood, or coffee ground material in the vomitus. Patient denies any stomach pain. Patient states has had normal bowel movements no significant constipation or diarrhea. Patient denies any dysuria, hematuria or significant nocturia. Patient denies any problems walking, swelling in the joints or loss of balance. Patient denies any skin changes, loss of hair or loss of weight. Patient denies any excessive worrying or anxiety or significant depression. Patient denies any problems with insomnia. Patient denies excessive thirst, polyuria, polydipsia. Patient denies any swollen glands, patient denies easy bruising or easy bleeding. Patient denies any recent infections, allergies or URI. Patient "s visual fields have not changed significantly in recent time.   PHYSICAL EXAM: Temp (!) 97.4 F (36.3 C) (Tympanic)   Resp 16   Wt 184 lb 11.9 oz (83.8 kg)   BMI 30.74 kg/m  Motor sensory and DTR levels are equal and symmetric in lower extremities palpation of the lumbar spine does elicit pain range of motion of her lower extremities does not elicit pain.  Proprioception is intact.  Well-developed well-nourished patient in NAD. HEENT reveals  PERLA, EOMI, discs not visualized.  Oral cavity is clear. No oral mucosal lesions are identified. Neck is clear without evidence of cervical or supraclavicular adenopathy. Lungs are clear to A&P. Cardiac examination is essentially unremarkable with regular rate and rhythm without murmur rub or thrill. Abdomen is benign with no organomegaly or masses noted. Motor sensory and DTR levels are equal and symmetric in the upper and lower extremities. Cranial nerves II through XII are grossly intact. Proprioception is intact. No peripheral adenopathy or edema is identified. No motor or sensory levels are noted. Crude visual fields are within  normal range.  LABORATORY DATA: No current biopsy for review    RADIOLOGY RESULTS: MRI scan reviewed PET CT scan has been ordered   IMPRESSION: Widespread metastatic bone disease in patient of yet unknown primary probably primary bronchogenic carcinoma in 70-year-old female  PLAN: At this time like to go ahead with palliative radiation therapy to the lumbar spine.  I would plan on delivering 3000 cGy in 10 fractions including her lumbar spine and SI joints.  Based on the size of the field I might have to go to 15 fractions to avoid toxicity to her small bowel.  Risks and benefits of treatment including diarrhea fatigue alteration of blood count skin reaction all were discussed in detail.  I have personally set up and ordered CT simulation.  Patient comprehends my treatment plan well.  I would like to take this opportunity to thank you for allowing me to participate in the care of your patient.Noreene Filbert, MD                                Noreene Filbert, MD

## 2018-07-14 ENCOUNTER — Other Ambulatory Visit: Payer: Self-pay

## 2018-07-15 ENCOUNTER — Other Ambulatory Visit: Payer: Self-pay

## 2018-07-15 ENCOUNTER — Ambulatory Visit
Admission: RE | Admit: 2018-07-15 | Discharge: 2018-07-15 | Disposition: A | Discharge status: Home / Self Care | Payer: PPO | Source: Ambulatory Visit | Attending: Radiation Oncology | Admitting: Radiation Oncology

## 2018-07-15 ENCOUNTER — Ambulatory Visit
Admission: RE | Admit: 2018-07-15 | Discharge: 2018-07-15 | Disposition: A | Discharge status: Home / Self Care | Payer: PPO | Source: Ambulatory Visit | Attending: Oncology | Admitting: Oncology

## 2018-07-15 DIAGNOSIS — I700 Atherosclerosis of aorta: Secondary | ICD-10-CM | POA: Diagnosis not present

## 2018-07-15 DIAGNOSIS — I2510 Atherosclerotic heart disease of native coronary artery without angina pectoris: Secondary | ICD-10-CM | POA: Insufficient documentation

## 2018-07-15 DIAGNOSIS — Z87891 Personal history of nicotine dependence: Secondary | ICD-10-CM | POA: Insufficient documentation

## 2018-07-15 DIAGNOSIS — C801 Malignant (primary) neoplasm, unspecified: Secondary | ICD-10-CM | POA: Insufficient documentation

## 2018-07-15 DIAGNOSIS — Z510 Encounter for antineoplastic radiation therapy: Secondary | ICD-10-CM | POA: Insufficient documentation

## 2018-07-15 DIAGNOSIS — R590 Localized enlarged lymph nodes: Secondary | ICD-10-CM | POA: Insufficient documentation

## 2018-07-15 DIAGNOSIS — C7951 Secondary malignant neoplasm of bone: Secondary | ICD-10-CM | POA: Diagnosis not present

## 2018-07-15 DIAGNOSIS — K7460 Unspecified cirrhosis of liver: Secondary | ICD-10-CM | POA: Insufficient documentation

## 2018-07-15 DIAGNOSIS — J439 Emphysema, unspecified: Secondary | ICD-10-CM | POA: Insufficient documentation

## 2018-07-15 DIAGNOSIS — R918 Other nonspecific abnormal finding of lung field: Secondary | ICD-10-CM | POA: Insufficient documentation

## 2018-07-15 LAB — GLUCOSE, CAPILLARY: Glucose-Capillary: 85 mg/dL (ref 70–99)

## 2018-07-15 MED ORDER — FLUDEOXYGLUCOSE F - 18 (FDG) INJECTION
9.6000 | Freq: Once | INTRAVENOUS | Status: AC | PRN
  Administered 2018-07-15: 9.92 via INTRAVENOUS

## 2018-07-16 ENCOUNTER — Encounter: Payer: Self-pay | Admitting: *Deleted

## 2018-07-16 DIAGNOSIS — R911 Solitary pulmonary nodule: Secondary | ICD-10-CM

## 2018-07-16 NOTE — Progress Notes (Signed)
  Oncology Nurse Navigator Documentation  Navigator Location: [facility]Med [facility]c (07/16/18 1000) Referral date to RadOnc/MedOnc: 07/07/18 (07/16/18 1000) )Navigator Encounter Type: Other (07/16/18 1000)   Abnormal Finding Date: 07/04/18 (07/16/18 1000)                     Barriers/Navigation Needs: Coordination of Care (07/16/18 1000)   Interventions: Coordination of Care (07/16/18 1000)   Coordination of Care: Appts;Broncoscopy (07/16/18 1000)        Acuity: Level 2 (07/16/18 1000)   Acuity Level 2: Initial guidance, education and coordination as needed;Educational needs;Assistance expediting appointments (07/16/18 1000)  Referral to pulmonary for bronchoscopy. Pt will be notified with appt. Nothing further needed at this time. Will follow up at next clinic visit.    Time Spent with Patient: 30 (07/16/18 1000)

## 2018-07-16 NOTE — Addendum Note (Signed)
Addended by: Telford Nab on: 07/16/2018 10:43 AM   Modules accepted: Orders

## 2018-07-18 ENCOUNTER — Other Ambulatory Visit: Payer: Self-pay | Admitting: *Deleted

## 2018-07-18 ENCOUNTER — Telehealth: Payer: Self-pay | Admitting: Pulmonary Disease

## 2018-07-18 DIAGNOSIS — C7951 Secondary malignant neoplasm of bone: Secondary | ICD-10-CM

## 2018-07-18 NOTE — Telephone Encounter (Signed)
Called patient for COVID-19 pre-screening for in office visit.  Have you recently traveled any where out of the local area in the last 2 weeks? No Have you been in close contact with a person diagnosed with COVID-19 within the last 2 weeks? No  Do you currently have any of the following symptoms? If so, when did they start? Cough     Diarrhea  Joint Pain Fever      Muscle Pain  Red eyes Shortness of breath   Abdominal pain Vomiting Loss of smell    Rash   Sore Throat Headache    Weakness     Okay to proceed with visit 07/21/2018

## 2018-07-21 ENCOUNTER — Other Ambulatory Visit: Payer: Self-pay

## 2018-07-21 ENCOUNTER — Encounter: Payer: Self-pay | Admitting: *Deleted

## 2018-07-21 ENCOUNTER — Ambulatory Visit: Payer: PPO | Admitting: Pulmonary Disease

## 2018-07-21 ENCOUNTER — Encounter: Payer: Self-pay | Admitting: Pulmonary Disease

## 2018-07-21 ENCOUNTER — Telehealth: Payer: Self-pay | Admitting: Internal Medicine

## 2018-07-21 VITALS — BP 160/90 | HR 87 | Temp 97.8°F | Ht 66.0 in | Wt 184.6 lb

## 2018-07-21 DIAGNOSIS — R918 Other nonspecific abnormal finding of lung field: Secondary | ICD-10-CM

## 2018-07-21 DIAGNOSIS — Z87891 Personal history of nicotine dependence: Secondary | ICD-10-CM

## 2018-07-21 DIAGNOSIS — R590 Localized enlarged lymph nodes: Secondary | ICD-10-CM

## 2018-07-21 NOTE — Progress Notes (Signed)
PULMONARY CONSULT NOTE  Requesting MD/Service: Janese Banks Date of initial consultation: 07/21/18 Reason for consultation: Lung mass with evidence of metastatic disease  PT PROFILE: 68 y.o. female former smoker (3 PPD x 45 yrs, quit 2015) initially presented to orthopedic surgery with progressive back pain. MRI of spine revealed evidence of widespread metastatic disease. PET scan revealed paratracheal lung mass. Referred for diagnostic procedure  DATA: 01/02/17 CT chest: 5 mm nodule is noted in superior segment of left lower lobe. No follow-up needed if patient is low-risk. Non-contrast chest CT can be considered in 12 months if patient is high-risk 07/04/18 MRI spine: Widespread metastatic disease to bone. Pathologic fracture L2 with ventral epidural tumor on the right. Pathologic fracture S1 07/15/18 PET: Right upper lobe pulmonary nodule or nodules with hypermetabolic thoracic adenopathy. 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 Widespread osseous metastasis  INTERVAL:  HPI:  As described in the patient profile section.  She initially developed back pain approximately 2 weeks ago.  It has been progressive.  She underwent an MRI of her L-spine with results as documented above.  Findings raise concern for metastatic cancer.  Whole-body PET scan performed with findings as documented above.  She is therefore referred for diagnostic bronchoscopy.  She also notes anorexia and a 10 pound weight loss over the past several months.  She has no significant respiratory symptoms.  She denies chest pain, cough, sputum production, hemoptysis, orthopnea, paroxysmal nocturnal dyspnea, lower extremity edema, calf tenderness.  Past Medical History:  Diagnosis Date  . Arthritis   . Cirrhosis ([place])   . Depression   . GERD (gastroesophageal reflux disease)   . Hyperlipidemia   . Hypertension   . Metastatic bone cancer [facility])     Past Surgical History:  Procedure Laterality Date  . APPENDECTOMY   1971  . CHOLECYSTECTOMY  1971  . COLONOSCOPY WITH PROPOFOL N/A 11/15/2016   Procedure: COLONOSCOPY WITH PROPOFOL;  Surgeon: Jonathon Bellows, MD;  Location: [facility] ENDOSCOPY;  Service: Gastroenterology;  Laterality: N/A;  . ESOPHAGOGASTRODUODENOSCOPY (EGD) WITH PROPOFOL N/A 01/07/2017   Procedure: ESOPHAGOGASTRODUODENOSCOPY (EGD) WITH PROPOFOL;  Surgeon: Jonathon Bellows, MD;  [facility] ENDOSCOPY;  Service: Gastroenterology;  Laterality: N/A;  . LAPAROSCOPY N/A 03/01/2017   Procedure: LAPAROSCOPY DIAGNOSTIC;  Surgeon: Robert Bellow, MD;  Location: [facility] ORS;  Service: General;  Laterality: N/A;  . TONSILECTOMY, ADENOIDECTOMY, BILATERAL MYRINGOTOMY [place]  . TONSILLECTOMY    . VENTRAL HERNIA REPAIR N/A 03/01/2017   10 x 14 CM Ventralight ST mesh, intraperitoneal location.   . VENTRAL HERNIA REPAIR N/A 03/01/2017   Procedure: HERNIA REPAIR VENTRAL ADULT;  Surgeon: Robert Bellow, MD;  Location: [facility] ORS;  Service: General;  Laterality: N/A;    MEDICATIONS: I have reviewed all medications and confirmed regimen as documented  Social History   Socioeconomic History  . Marital status: Married    Spouse name: Not on file  . Number of children: Not on file  . Years of education: Not on file  . Highest education level: Not on file  Occupational History  . Not on file  Social Needs  . Financial resource strain: Not hard at all  . Food insecurity    Worry: Never true    Inability: Never true  . Transportation needs    Medical: No    Non-medical: No  Tobacco Use  . Smoking status: Former Smoker    Packs/day: 2.00    Years: 50.00    Pack years: 100.00    Types: Cigarettes  Quit date: 02/07/2012    Years since quitting: 6.4  . Smokeless tobacco: Never Used  . Tobacco comment: uses vape without nicotine, one capsule every 3 months.  Substance and Sexual Activity  . Alcohol use: No    Alcohol/week: 0.0 standard drinks  . Drug use: No  . Sexual activity: Yes  Lifestyle  .  Physical activity    Days per week: Not on file    Minutes per session: Not on file  . Stress: Not at all  Relationships  . Social Herbalist on phone: Not on file    Gets together: Not on file    Attends religious service: Not on file    Active member of club or organization: Not on file    Attends meetings of clubs or organizations: Not on file    Relationship status: Not on file  . Intimate partner violence    Fear of current or ex partner: No    Emotionally abused: No    Physically abused: No    Forced sexual activity: No  Other Topics Concern  . Not on file  Social History Narrative   Married   Retired   Clinical cytogeneticist level of education    No children    1 cup of coffee    Family History  Problem Relation Age of Onset  . Hypertension Mother   . Ovarian cancer Mother 70  . Heart disease Father   . Stroke Father   . Ovarian cancer Sister        ? dx cancer had hyst.  . Breast cancer Neg Hx     ROS: No fever, myalgias/arthralgias, unexplained weight loss or weight gain No new focal weakness or sensory deficits No otalgia, hearing loss, visual changes, nasal and sinus symptoms, mouth and throat problems No neck pain or adenopathy No abdominal pain, N/V/D, diarrhea, change in bowel pattern No dysuria, change in urinary pattern   Vitals:   07/21/18 0844 07/21/18 0847  BP:  (!) 160/90  Pulse:  87  Temp:  97.8 F (36.6 C)  TempSrc:  Temporal  SpO2:  92%  Weight: 184 lb 9.6 oz (83.7 kg)   Height: 5\' 6"  (1.676 m)      EXAM:  Gen: Centripetal obesity, No overt respiratory distress HEENT: NCAT, sclera white, oropharynx normal Neck: Supple without LAN, thyromegaly, JVD Lungs: breath sounds full without wheezes or other adventitious sounds Cardiovascular: RRR, no murmurs noted Abdomen: Soft, nontender, normal BS Ext: without clubbing, cyanosis, edema Neuro: CNs grossly intact, motor and sensory intact Skin: Limited exam, no lesions  noted  DATA:   BMP Latest Ref Rng & Units 07/08/2018 07/04/2018 02/26/2018  Glucose 70 - 99 mg/dL 91 - 95  BUN 8 - 23 mg/dL 20 - 11  Creatinine 0.44 - 1.00 mg/dL 0.96 0.80 0.98  Sodium 135 - 145 mmol/L 142 - 143  Potassium 3.5 - 5.1 mmol/L 3.6 - 3.5  Chloride 98 - 111 mmol/L 106 - 104  CO2 22 - 32 mmol/L 26 - 32  Calcium 8.9 - 10.3 mg/dL 8.9 - 9.3    CBC Latest Ref Rng & Units 07/08/2018 02/26/2018 12/02/2017  WBC 4.0 - 10.5 K/uL 5.1 6.3 6.9  Hemoglobin 12.0 - 15.0 g/dL 11.1(L) 12.7 12.8  Hematocrit 36.0 - 46.0 % 36.3 39.8 39.4  Platelets 150 - 400 K/uL 180 199.0 190.0    CXR:  No recent film available  I have personally reviewed all chest radiographs reported above  including CXRs and CT chest unless otherwise indicated  IMPRESSION:     ICD-10-CM   1. Lung mass  R91.8   2. Mediastinal lymphadenopathy  R59.0 lidocaine (XYLOCAINE) 2 % jelly 1 application    phenylephrine (NEO-SYNEPHRINE) 0.25 % nasal spray 1 spray    butamben-tetracaine-benzocaine (CETACAINE) spray 1 spray  3. Former smoker  Z87.891    We reviewed the PET scan findings together in the office.  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  She is already scheduled to initiate radiation therapy to her L-spine this week.  The best option for tissue diagnosis would be EBUS.  This would increase the diagnostic yield over a "blind" transtracheal aspirate.  PLAN:  Discussed with Dr. Ashby Dawes.  We have arranged for her to undergo EBUS next week.  The procedure was described in detail including its indication, risks and alternatives.  She was offered the opportunity to ask any questions and all questions were answered.  She knows to contact us if any further questions arise between now and the time of the procedure.  Assuming a diagnosis of malignancy is made, further management will be per Dr. Janese Banks and Dr. Donella Stade.  Follow-up in this office as needed.   Merton Border, MD PCCM service Mobile [phone] Pager [phone] 07/21/2018 11:38 AM

## 2018-07-21 NOTE — Progress Notes (Signed)
Entered in error

## 2018-07-21 NOTE — Telephone Encounter (Signed)
EBUS is scheduled for 07/30/2018 at 1:00. Pre op and covid testing is 07/25/2018 at 1:30. Pt is aware of date/time and location.  Dx code: R91.1  Rhonda please see bronch information.

## 2018-07-21 NOTE — H&P (View-Only) (Signed)
PULMONARY CONSULT NOTE  Requesting MD/Service: Anne [name] Date of initial consultation: 07/21/18 Reason for consultation: Lung mass with evidence of metastatic disease  PT PROFILE: 68 y.o. female former smoker (3 PPD x 45 yrs, quit 2015) initially presented to orthopedic surgery with progressive back pain. MRI of spine revealed evidence of widespread metastatic disease. PET scan revealed paratracheal lung mass. Referred for diagnostic procedure  DATA: 01/02/17 CT chest: 5 mm nodule is noted in superior segment of left lower lobe. No follow-up needed if patient is low-risk. Non-contrast chest CT can be considered in 12 months if patient is high-risk 07/04/18 MRI spine: Widespread metastatic disease to bone. Pathologic fracture L2 with ventral epidural tumor on the right. Pathologic fracture S1 07/15/18 PET: Right upper lobe pulmonary nodule or nodules with hypermetabolic thoracic adenopathy. 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 Widespread osseous metastasis  INTERVAL:  HPI:  As described in the patient profile section.  She initially developed back pain approximately 2 weeks ago.  It has been progressive.  She underwent an MRI of her L-spine with results as documented above.  Findings raise concern for metastatic cancer.  Whole-body PET scan performed with findings as documented above.  She is therefore referred for diagnostic bronchoscopy.  She also notes anorexia and a 10 pound weight loss over the past several months.  She has no significant respiratory symptoms.  She denies chest pain, cough, sputum production, hemoptysis, orthopnea, paroxysmal nocturnal dyspnea, lower extremity edema, calf tenderness.  Past Medical History:  Diagnosis Date  . Arthritis   . Cirrhosis ([place])   . Depression   . GERD (gastroesophageal reflux disease)   . Hyperlipidemia   . Hypertension   . Metastatic bone cancer [facility])     Past Surgical History:  Procedure Laterality Date  . APPENDECTOMY   1971  . CHOLECYSTECTOMY  1971  . COLONOSCOPY WITH PROPOFOL N/A 11/15/2016   Procedure: COLONOSCOPY WITH PROPOFOL;  Surgeon: Jonathon Bellows, MD;  Location[facility] ENDOSCOPY;  Service: Gastroenterology;  Laterality: N/A;  . ESOPHAGOGASTRODUODENOSCOPY (EGD) WITH PROPOFOL N/A 01/07/2017   Procedure: ESOPHAGOGASTRODUODENOSCOPY (EGD) WITH PROPOFOL;  Surgeon: Jonathon Bellows, MD;  Location: [facility] ENDOSCOPY;  Service: Gastroenterology;  Laterality: N/A;  . LAPAROSCOPY N/A 03/01/2017   Procedure: LAPAROSCOPY DIAGNOSTIC;  Surgeon: Robert Bellow, MD;  Location: [facility] ORS;  Service: General;  Laterality: N/A;  . TONSILECTOMY, ADENOIDECTOMY, BILATERAL MYRINGOTOMY [place]  . TONSILLECTOMY    . VENTRAL HERNIA REPAIR N/A 03/01/2017   10 x 14 CM Ventralight ST mesh, intraperitoneal location.   . VENTRAL HERNIA REPAIR N/A 03/01/2017   Procedure: HERNIA REPAIR VENTRAL ADULT;  Surgeon: Robert Bellow, MD;  Location: [facility] ORS;  Service: General;  Laterality: N/A;    MEDICATIONS: I have reviewed all medications and confirmed regimen as documented  Social History   Socioeconomic History  . Marital status: Married    Spouse name: Not on file  . Number of children: Not on file  . Years of education: Not on file  . Highest education level: Not on file  Occupational History  . Not on file  Social Needs  . Financial resource strain: Not hard at all  . Food insecurity    Worry: Never true    Inability: Never true  . Transportation needs    Medical: No    Non-medical: No  Tobacco Use  . Smoking status: Former Smoker    Packs/day: 2.00    Years: 50.00    Pack years: 100.00    Types: Cigarettes  Quit date: 02/07/2012    Years since quitting: 6.4  . Smokeless tobacco: Never Used  . Tobacco comment: uses vape without nicotine, one capsule every 3 months.  Substance and Sexual Activity  . Alcohol use: No    Alcohol/week: 0.0 standard drinks  . Drug use: No  . Sexual activity: Yes  Lifestyle  .  Physical activity    Days per week: Not on file    Minutes per session: Not on file  . Stress: Not at all  Relationships  . Social Herbalist on phone: Not on file    Gets together: Not on file    Attends religious service: Not on file    Active member of club or organization: Not on file    Attends meetings of clubs or organizations: Not on file    Relationship status: Not on file  . Intimate partner violence    Fear of current or ex partner: No    Emotionally abused: No    Physically abused: No    Forced sexual activity: No  Other Topics Concern  . Not on file  Social History Narrative   Married   Retired   Clinical cytogeneticist level of education    No children    1 cup of coffee    Family History  Problem Relation Age of Onset  . Hypertension Mother   . Ovarian cancer Mother 19  . Heart disease Father   . Stroke Father   . Ovarian cancer Sister        ? dx cancer had hyst.  . Breast cancer Neg Hx     ROS: No fever, myalgias/arthralgias, unexplained weight loss or weight gain No new focal weakness or sensory deficits No otalgia, hearing loss, visual changes, nasal and sinus symptoms, mouth and throat problems No neck pain or adenopathy No abdominal pain, N/V/D, diarrhea, change in bowel pattern No dysuria, change in urinary pattern   Vitals:   07/21/18 0844 07/21/18 0847  BP:  (!) 160/90  Pulse:  87  Temp:  97.8 F (36.6 C)  TempSrc:  Temporal  SpO2:  92%  Weight: 184 lb 9.6 oz (83.7 kg)   Height: 5\' 6"  (1.676 m)      EXAM:  Gen: Centripetal obesity, No overt respiratory distress HEENT: NCAT, sclera white, oropharynx normal Neck: Supple without LAN, thyromegaly, JVD Lungs: breath sounds full without wheezes or other adventitious sounds Cardiovascular: RRR, no murmurs noted Abdomen: Soft, nontender, normal BS Ext: without clubbing, cyanosis, edema Neuro: CNs grossly intact, motor and sensory intact Skin: Limited exam, no lesions  noted  DATA:   BMP Latest Ref Rng & Units 07/08/2018 07/04/2018 02/26/2018  Glucose 70 - 99 mg/dL 91 - 95  BUN 8 - 23 mg/dL 20 - 11  Creatinine 0.44 - 1.00 mg/dL 0.96 0.80 0.98  Sodium 135 - 145 mmol/L 142 - 143  Potassium 3.5 - 5.1 mmol/L 3.6 - 3.5  Chloride 98 - 111 mmol/L 106 - 104  CO2 22 - 32 mmol/L 26 - 32  Calcium 8.9 - 10.3 mg/dL 8.9 - 9.3    CBC Latest Ref Rng & Units 07/08/2018 02/26/2018 12/02/2017  WBC 4.0 - 10.5 K/uL 5.1 6.3 6.9  Hemoglobin 12.0 - 15.0 g/dL 11.1(L) 12.7 12.8  Hematocrit 36.0 - 46.0 % 36.3 39.8 39.4  Platelets 150 - 400 K/uL 180 199.0 190.0    CXR:  No recent film available  I have personally reviewed all chest radiographs reported above  including CXRs and CT chest unless otherwise indicated  IMPRESSION:     ICD-10-CM   1. Lung mass  R91.8   2. Mediastinal lymphadenopathy  R59.0 lidocaine (XYLOCAINE) 2 % jelly 1 application    phenylephrine (NEO-SYNEPHRINE) 0.25 % nasal spray 1 spray    butamben-tetracaine-benzocaine (CETACAINE) spray 1 spray  3. Former smoker  Z87.891    We reviewed the PET scan findings together in the office.  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  She is already scheduled to initiate radiation therapy to her L-spine this week.  The best option for tissue diagnosis would be EBUS.  This would increase the diagnostic yield over a "blind" transtracheal aspirate.  PLAN:  Discussed with Dr. Ashby Dawes.  We have arranged for her to undergo EBUS next week.  The procedure was described in detail including its indication, risks and alternatives.  She was offered the opportunity to ask any questions and all questions were answered.  She knows to contact us if any further questions arise between now and the time of the procedure.  Assuming a diagnosis of malignancy is made, further management will be per Dr. Janese [name] and Dr. Donella Stade.  Follow-up in this office as needed.   Merton Border, MD PCCM service Mobile [phone] Pager [phone] 07/21/2018 11:38 AM

## 2018-07-22 ENCOUNTER — Encounter: Payer: Self-pay | Admitting: *Deleted

## 2018-07-22 ENCOUNTER — Ambulatory Visit: Payer: PPO | Admitting: Oncology

## 2018-07-22 ENCOUNTER — Ambulatory Visit
Admission: RE | Admit: 2018-07-22 | Discharge: 2018-07-22 | Disposition: A | Discharge status: Home / Self Care | Payer: PPO | Source: Ambulatory Visit | Attending: Radiation Oncology | Admitting: Radiation Oncology

## 2018-07-22 ENCOUNTER — Other Ambulatory Visit: Payer: Self-pay

## 2018-07-22 DIAGNOSIS — C801 Malignant (primary) neoplasm, unspecified: Secondary | ICD-10-CM | POA: Diagnosis not present

## 2018-07-22 DIAGNOSIS — C7951 Secondary malignant neoplasm of bone: Secondary | ICD-10-CM | POA: Diagnosis not present

## 2018-07-22 DIAGNOSIS — Z510 Encounter for antineoplastic radiation therapy: Secondary | ICD-10-CM | POA: Diagnosis not present

## 2018-07-22 DIAGNOSIS — Z87891 Personal history of nicotine dependence: Secondary | ICD-10-CM | POA: Diagnosis not present

## 2018-07-22 NOTE — Progress Notes (Signed)
  Oncology Nurse Navigator Documentation  Navigator Location: [facility] (07/22/18 1100)   )Navigator Encounter Type: Lobby (07/22/18 1100)                     Patient Visit Type: QKMMNO (07/22/18 1100) Treatment Phase: First Radiation Tx (07/22/18 1100) Barriers/Navigation Needs: Coordination of Care (07/22/18 1100)   Interventions: Coordination of Care (07/22/18 1100)   Coordination of Care: Appts;Radiology (07/22/18 1100)       met with patient prior to radiation appointment today. Introduced to navigator services. Reviewed upcoming appts for brain MRI and follow up with Dr. Janese Banks after her bronchoscopy. All questions answered during visit. Contact info given and instructed to call with any further questions or needs. Pt verbalized understanding.           Time Spent with Patient: 30 (07/22/18 1100)

## 2018-07-23 ENCOUNTER — Ambulatory Visit
Admission: RE | Admit: 2018-07-23 | Discharge: 2018-07-23 | Disposition: A | Discharge status: Home / Self Care | Payer: PPO | Source: Ambulatory Visit | Attending: Radiation Oncology | Admitting: Radiation Oncology

## 2018-07-23 ENCOUNTER — Encounter (INDEPENDENT_AMBULATORY_CARE_PROVIDER_SITE_OTHER): Payer: Self-pay

## 2018-07-23 ENCOUNTER — Other Ambulatory Visit: Payer: Self-pay

## 2018-07-23 DIAGNOSIS — Z510 Encounter for antineoplastic radiation therapy: Secondary | ICD-10-CM | POA: Diagnosis not present

## 2018-07-24 ENCOUNTER — Institutional Professional Consult (permissible substitution): Payer: PPO | Admitting: Radiation Oncology

## 2018-07-24 ENCOUNTER — Ambulatory Visit
Admission: RE | Admit: 2018-07-24 | Discharge: 2018-07-24 | Disposition: A | Discharge status: Home / Self Care | Payer: PPO | Source: Ambulatory Visit | Attending: Radiation Oncology | Admitting: Radiation Oncology

## 2018-07-24 ENCOUNTER — Other Ambulatory Visit: Payer: Self-pay

## 2018-07-24 ENCOUNTER — Ambulatory Visit: Payer: PPO | Admitting: Oncology

## 2018-07-24 DIAGNOSIS — Z510 Encounter for antineoplastic radiation therapy: Secondary | ICD-10-CM | POA: Diagnosis not present

## 2018-07-24 NOTE — Telephone Encounter (Signed)
HTA form completed and sent to [phone] due to the web site not having our physicians loaded into system yet. Waiting on decision. Rhonda J Cobb

## 2018-07-25 ENCOUNTER — Ambulatory Visit
Admission: RE | Admit: 2018-07-25 | Discharge: 2018-07-25 | Disposition: A | Discharge status: Home / Self Care | Payer: PPO | Source: Ambulatory Visit | Attending: Radiation Oncology | Admitting: Radiation Oncology

## 2018-07-25 ENCOUNTER — Other Ambulatory Visit: Payer: Self-pay

## 2018-07-25 ENCOUNTER — Encounter
Admission: RE | Admit: 2018-07-25 | Discharge: 2018-07-25 | Disposition: A | Discharge status: Home / Self Care | Payer: PPO | Source: Ambulatory Visit | Attending: Internal Medicine | Admitting: Internal Medicine

## 2018-07-25 DIAGNOSIS — I10 Essential (primary) hypertension: Secondary | ICD-10-CM | POA: Insufficient documentation

## 2018-07-25 DIAGNOSIS — Z01818 Encounter for other preprocedural examination: Secondary | ICD-10-CM | POA: Insufficient documentation

## 2018-07-25 DIAGNOSIS — Z1159 Encounter for screening for other viral diseases: Secondary | ICD-10-CM | POA: Insufficient documentation

## 2018-07-25 DIAGNOSIS — Z510 Encounter for antineoplastic radiation therapy: Secondary | ICD-10-CM | POA: Diagnosis not present

## 2018-07-25 LAB — APTT: aPTT: 32 seconds (ref 24–36)

## 2018-07-25 LAB — PROTIME-INR
INR: 1 (ref 0.8–1.2)
Prothrombin Time: 13.3 s (ref 11.4–15.2)

## 2018-07-25 NOTE — Patient Instructions (Signed)
  Your procedure is scheduled on: Wednesday July 30, 2018 Report to Same Day Surgery 2nd floor [facility] Entrance-take elevator on left to 2nd floor.  Check in with surgery information desk.) To find out your arrival time, call [phone] 1:00-3:00 PM on Tuesday July 29, 2018  Remember: Instructions that are not followed completely may result in serious medical risk, up to and including death, or upon the discretion of your surgeon and anesthesiologist your surgery may need to be rescheduled.    __x__ 1. Do not eat food (including mints, candies, chewing gum) after midnight the night before your procedure. You may drink clear liquids up to 2 hours before you are scheduled to arrive at the hospital for your procedure.  Do not drink anything within 2 hours of your scheduled arrival to the hospital.  Approved clear liquids:  --Water or Apple juice without pulp  --Clear carbohydrate beverage such as Gatorade or Powerade  --Black Coffee or Clear Tea (No milk, no creamers, do not add anything to the coffee or tea)    __x__ 2. No Alcohol for 24 hours before or after surgery.   __x__ 3. No Smoking or e-cigarettes for 24 hours before surgery.  Do not use any chewable tobacco products for at least 6 hours before surgery.   __x__ 4. Notify your doctor if there is any change in your medical condition (cold, fever, infections).   __x__ 5. On the morning of surgery brush your teeth with toothpaste and water.  You may rinse your mouth with mouthwash if you wish.  Do not swallow any toothpaste or mouthwash.  Please read over the following fact sheets that you were given:   [facility] Preparing for Surgery and/or MRSA Information    __x__  Use antibacterial soap such as Dial to shower/bathe on the day of your procedure.   Do not wear jewelry, make-up, hairpins, clips or nail polish on the day of your procedure.  Do not wear lotions, powders, deodorant, or perfumes.   Do not shave  below the face/neck 48 hours prior to surgery.   Do not bring valuables to the hospital.    [facility] is not responsible for any belongings or valuables.               For patients admitted to the hospital, discharge time is determined by your treatment team.  For patients discharged on the day of surgery, you will NOT be permitted to drive yourself home.  You must have a responsible adult with you for 24 hours after surgery.  __x__ Take these medicines on the morning of surgery with a SMALL SIP OF WATER:  1. Escitalopram/Lexapro  2. Bupropion/Wellbutrin  3. Pantoprazole/Protonix  4. Oxycodone if needed for pain  ____ Follow recommendations from Cardiologist, Pulmonologist or PCP regarding stopping blood thinners such as Aspirin, Coumadin, Plavix, Eliquis, Effient, Pradaxa, and Pletal.  __x__ TODAY: Do not take any Anti-inflammatories such as Advil, Ibuprofen, Motrin, Aleve, Naproxen, Naprosyn, BC/Goodies powders or aspirin products. You may continue to take Tylenol and Celebrex.   __x__ TODAY: Stop supplements until after your procedure. You may continue to take Vitamin D, Vitamin B, and multivitamin.

## 2018-07-25 NOTE — Telephone Encounter (Signed)
HTA Authorization # [id number] Valid and Billable Code Valid from 07/30/2018 - 10/28/2018 for [place] and [id number]. Authorization sent to [facility] in the [place]. Rhonda J Cobb

## 2018-07-26 LAB — NOVEL CORONAVIRUS, NAA (HOSP ORDER, SEND-OUT TO REF LAB; TAT 18-24 HRS): SARS-CoV-2, NAA: NOT DETECTED

## 2018-07-28 ENCOUNTER — Other Ambulatory Visit: Payer: Self-pay

## 2018-07-28 ENCOUNTER — Ambulatory Visit
Admission: RE | Admit: 2018-07-28 | Discharge: 2018-07-28 | Disposition: A | Discharge status: Home / Self Care | Payer: PPO | Source: Ambulatory Visit | Attending: Radiation Oncology | Admitting: Radiation Oncology

## 2018-07-28 DIAGNOSIS — Z510 Encounter for antineoplastic radiation therapy: Secondary | ICD-10-CM | POA: Diagnosis not present

## 2018-07-29 ENCOUNTER — Other Ambulatory Visit: Payer: Self-pay

## 2018-07-29 ENCOUNTER — Ambulatory Visit
Admission: RE | Admit: 2018-07-29 | Discharge: 2018-07-29 | Disposition: A | Discharge status: Home / Self Care | Payer: PPO | Source: Ambulatory Visit | Attending: Oncology | Admitting: Oncology

## 2018-07-29 ENCOUNTER — Ambulatory Visit
Admission: RE | Admit: 2018-07-29 | Discharge: 2018-07-29 | Disposition: A | Discharge status: Home / Self Care | Payer: PPO | Source: Ambulatory Visit | Attending: Radiation Oncology | Admitting: Radiation Oncology

## 2018-07-29 ENCOUNTER — Ambulatory Visit: Payer: PPO

## 2018-07-29 DIAGNOSIS — Z87891 Personal history of nicotine dependence: Secondary | ICD-10-CM | POA: Diagnosis not present

## 2018-07-29 DIAGNOSIS — C7951 Secondary malignant neoplasm of bone: Secondary | ICD-10-CM | POA: Diagnosis not present

## 2018-07-29 DIAGNOSIS — R911 Solitary pulmonary nodule: Secondary | ICD-10-CM | POA: Diagnosis not present

## 2018-07-29 DIAGNOSIS — Z510 Encounter for antineoplastic radiation therapy: Secondary | ICD-10-CM | POA: Diagnosis not present

## 2018-07-29 DIAGNOSIS — R9082 White matter disease, unspecified: Secondary | ICD-10-CM | POA: Diagnosis not present

## 2018-07-29 DIAGNOSIS — C801 Malignant (primary) neoplasm, unspecified: Secondary | ICD-10-CM | POA: Diagnosis not present

## 2018-07-29 MED ORDER — GADOBUTROL 1 MMOL/ML IV SOLN
7.5000 mL | Freq: Once | INTRAVENOUS | Status: AC | PRN
  Administered 2018-07-29: 7.5 mL via INTRAVENOUS

## 2018-07-30 ENCOUNTER — Ambulatory Visit: Payer: PPO | Admitting: Anesthesiology

## 2018-07-30 ENCOUNTER — Encounter
Admission: RE | Disposition: A | Discharge status: Home / Self Care | Payer: Self-pay | Source: Home / Self Care | Attending: Internal Medicine

## 2018-07-30 ENCOUNTER — Other Ambulatory Visit: Payer: Self-pay

## 2018-07-30 ENCOUNTER — Encounter: Payer: Self-pay | Admitting: *Deleted

## 2018-07-30 ENCOUNTER — Ambulatory Visit
Admission: RE | Admit: 2018-07-30 | Discharge: 2018-07-30 | Disposition: A | Discharge status: Home / Self Care | Payer: PPO | Source: Ambulatory Visit | Attending: Radiation Oncology | Admitting: Radiation Oncology

## 2018-07-30 ENCOUNTER — Ambulatory Visit
Admission: RE | Admit: 2018-07-30 | Discharge: 2018-07-30 | Disposition: A | Discharge status: Home / Self Care | Payer: PPO | Attending: Internal Medicine | Admitting: Internal Medicine

## 2018-07-30 ENCOUNTER — Inpatient Hospital Stay: Payer: PPO

## 2018-07-30 ENCOUNTER — Ambulatory Visit: Payer: PPO

## 2018-07-30 DIAGNOSIS — K7460 Unspecified cirrhosis of liver: Secondary | ICD-10-CM | POA: Insufficient documentation

## 2018-07-30 DIAGNOSIS — Z8041 Family history of malignant neoplasm of ovary: Secondary | ICD-10-CM | POA: Insufficient documentation

## 2018-07-30 DIAGNOSIS — I10 Essential (primary) hypertension: Secondary | ICD-10-CM | POA: Insufficient documentation

## 2018-07-30 DIAGNOSIS — Z823 Family history of stroke: Secondary | ICD-10-CM | POA: Diagnosis not present

## 2018-07-30 DIAGNOSIS — R911 Solitary pulmonary nodule: Secondary | ICD-10-CM | POA: Insufficient documentation

## 2018-07-30 DIAGNOSIS — C801 Malignant (primary) neoplasm, unspecified: Secondary | ICD-10-CM | POA: Diagnosis not present

## 2018-07-30 DIAGNOSIS — C7951 Secondary malignant neoplasm of bone: Secondary | ICD-10-CM | POA: Insufficient documentation

## 2018-07-30 DIAGNOSIS — Z9049 Acquired absence of other specified parts of digestive tract: Secondary | ICD-10-CM | POA: Insufficient documentation

## 2018-07-30 DIAGNOSIS — F329 Major depressive disorder, single episode, unspecified: Secondary | ICD-10-CM | POA: Insufficient documentation

## 2018-07-30 DIAGNOSIS — C771 Secondary and unspecified malignant neoplasm of intrathoracic lymph nodes: Secondary | ICD-10-CM | POA: Diagnosis not present

## 2018-07-30 DIAGNOSIS — D649 Anemia, unspecified: Secondary | ICD-10-CM | POA: Insufficient documentation

## 2018-07-30 DIAGNOSIS — Z8249 Family history of ischemic heart disease and other diseases of the circulatory system: Secondary | ICD-10-CM | POA: Insufficient documentation

## 2018-07-30 DIAGNOSIS — R918 Other nonspecific abnormal finding of lung field: Secondary | ICD-10-CM

## 2018-07-30 DIAGNOSIS — K219 Gastro-esophageal reflux disease without esophagitis: Secondary | ICD-10-CM | POA: Diagnosis not present

## 2018-07-30 DIAGNOSIS — F17290 Nicotine dependence, other tobacco product, uncomplicated: Secondary | ICD-10-CM | POA: Insufficient documentation

## 2018-07-30 DIAGNOSIS — E785 Hyperlipidemia, unspecified: Secondary | ICD-10-CM | POA: Insufficient documentation

## 2018-07-30 DIAGNOSIS — R590 Localized enlarged lymph nodes: Secondary | ICD-10-CM

## 2018-07-30 DIAGNOSIS — Z9889 Other specified postprocedural states: Secondary | ICD-10-CM

## 2018-07-30 DIAGNOSIS — M1990 Unspecified osteoarthritis, unspecified site: Secondary | ICD-10-CM | POA: Diagnosis not present

## 2018-07-30 DIAGNOSIS — Z87891 Personal history of nicotine dependence: Secondary | ICD-10-CM | POA: Insufficient documentation

## 2018-07-30 HISTORY — PX: ENDOBRONCHIAL ULTRASOUND: SHX5096

## 2018-07-30 LAB — CBC
HCT: 37.2 % (ref 36.0–46.0)
Hemoglobin: 11.3 g/dL — ABNORMAL LOW (ref 12.0–15.0)
MCH: 26.3 pg (ref 26.0–34.0)
MCHC: 30.4 g/dL (ref 30.0–36.0)
MCV: 86.7 fL (ref 80.0–100.0)
Platelets: 161 10*3/uL (ref 150–400)
RBC: 4.29 MIL/uL (ref 3.87–5.11)
RDW: 16.5 % — ABNORMAL HIGH (ref 11.5–15.5)
WBC: 4.2 10*3/uL (ref 4.0–10.5)
nRBC: 0 % (ref 0.0–0.2)

## 2018-07-30 SURGERY — ENDOBRONCHIAL ULTRASOUND (EBUS)
Anesthesia: General | Laterality: Right

## 2018-07-30 MED ORDER — ONDANSETRON HCL 4 MG/2ML IJ SOLN
INTRAMUSCULAR | Status: DC | PRN
  Administered 2018-07-30: 4 mg via INTRAVENOUS

## 2018-07-30 MED ORDER — FENTANYL CITRATE (PF) 100 MCG/2ML IJ SOLN: 25.0000 ug | INTRAMUSCULAR | Status: DC | PRN

## 2018-07-30 MED ORDER — EPHEDRINE SULFATE 50 MG/ML IJ SOLN
INTRAMUSCULAR | Status: DC | PRN
  Administered 2018-07-30: 10 mg via INTRAVENOUS

## 2018-07-30 MED ORDER — PHENYLEPHRINE HCL (PRESSORS) 10 MG/ML IV SOLN: INTRAVENOUS | Status: DC | PRN

## 2018-07-30 MED ORDER — MIDAZOLAM HCL 2 MG/2ML IJ SOLN
INTRAMUSCULAR | Status: AC
  Filled 2018-07-30: qty 2

## 2018-07-30 MED ORDER — OXYCODONE HCL 5 MG/5ML PO SOLN: 5.0000 mg | Freq: Once | ORAL | Status: DC | PRN

## 2018-07-30 MED ORDER — FENTANYL CITRATE (PF) 100 MCG/2ML IJ SOLN
INTRAMUSCULAR | Status: DC | PRN
  Administered 2018-07-30: 50 ug via INTRAVENOUS

## 2018-07-30 MED ORDER — ROCURONIUM BROMIDE 100 MG/10ML IV SOLN
INTRAVENOUS | Status: DC | PRN
  Administered 2018-07-30: 30 mg via INTRAVENOUS

## 2018-07-30 MED ORDER — OXYCODONE HCL 5 MG PO TABS: 5.0000 mg | ORAL_TABLET | Freq: Once | ORAL | Status: DC | PRN

## 2018-07-30 MED ORDER — MIDAZOLAM HCL 2 MG/2ML IJ SOLN
INTRAMUSCULAR | Status: DC | PRN
  Administered 2018-07-30: 1 mg via INTRAVENOUS

## 2018-07-30 MED ORDER — LIDOCAINE HCL 2 % EX GEL
1.0000 "application " | Freq: Once | CUTANEOUS | Status: DC
  Filled 2018-07-30: qty 4250

## 2018-07-30 MED ORDER — FENTANYL CITRATE (PF) 100 MCG/2ML IJ SOLN
INTRAMUSCULAR | Status: AC
  Filled 2018-07-30: qty 2

## 2018-07-30 MED ORDER — PROPOFOL 500 MG/50ML IV EMUL
INTRAVENOUS | Status: AC
  Filled 2018-07-30: qty 50

## 2018-07-30 MED ORDER — SEVOFLURANE IN SOLN
RESPIRATORY_TRACT | Status: AC
  Filled 2018-07-30: qty 250

## 2018-07-30 MED ORDER — BUTAMBEN-TETRACAINE-BENZOCAINE 2-2-14 % EX AERO
1.0000 | INHALATION_SPRAY | Freq: Once | CUTANEOUS | Status: DC
  Filled 2018-07-30: qty 20

## 2018-07-30 MED ORDER — LACTATED RINGERS IV SOLN
INTRAVENOUS | Status: DC
  Administered 2018-07-30 (×2): via INTRAVENOUS

## 2018-07-30 MED ORDER — PHENYLEPHRINE HCL (PRESSORS) 10 MG/ML IV SOLN
INTRAVENOUS | Status: DC | PRN
  Administered 2018-07-30: 100 ug via INTRAVENOUS

## 2018-07-30 MED ORDER — PROPOFOL 10 MG/ML IV BOLUS
INTRAVENOUS | Status: DC | PRN
  Administered 2018-07-30 (×2): 30 mg via INTRAVENOUS
  Administered 2018-07-30: 120 mg via INTRAVENOUS
  Administered 2018-07-30: 50 mg via INTRAVENOUS

## 2018-07-30 MED ORDER — PROPOFOL 10 MG/ML IV BOLUS
INTRAVENOUS | Status: AC
  Filled 2018-07-30: qty 20

## 2018-07-30 MED ORDER — SUGAMMADEX SODIUM 200 MG/2ML IV SOLN
INTRAVENOUS | Status: DC | PRN
  Administered 2018-07-30: 200 mg via INTRAVENOUS

## 2018-07-30 MED ORDER — PHENYLEPHRINE HCL 0.25 % NA SOLN
1.0000 | Freq: Four times a day (QID) | NASAL | Status: DC | PRN
  Filled 2018-07-30: qty 15

## 2018-07-30 NOTE — Discharge Instructions (Signed)

## 2018-07-30 NOTE — Interval H&P Note (Signed)
History and Physical Interval Note:  07/30/2018 12:54 PM  Anne [name]  has presented today for surgery, with the diagnosis of RIGHT UPPER LOBE NODULE.  The various methods of treatment have been discussed with the patient and family. After consideration of risks, benefits and other options for treatment, the patient has consented to  Procedure(s): ENDOBRONCHIAL ULTRASOUND (Right) as a surgical intervention.  The patient's history has been reviewed, patient examined, no change in status, stable for surgery.  I have reviewed the patient's chart and labs.  Questions were answered to the patient's satisfaction.     Laverle Hobby

## 2018-07-30 NOTE — Transfer of Care (Signed)
Immediate Anesthesia Transfer of Care Note  Patient: Anne [name]  Procedure(s) Performed: ENDOBRONCHIAL ULTRASOUND (Right )  Patient Location: PACU  Anesthesia Type:General  Level of Consciousness: awake and alert   Airway & Oxygen Therapy: Patient Spontanous Breathing and Patient connected to face mask oxygen  Post-op Assessment: Report given to RN and Post -op Vital signs reviewed and stable  Post vital signs: Reviewed and stable  Last Vitals:  Vitals Value Taken Time  BP 143/71 07/30/18 1419  Temp 36.8 C 07/30/18 1418  Pulse 91 07/30/18 1422  Resp 19 07/30/18 1422  SpO2 100 % 07/30/18 1422  Vitals shown include unvalidated device data.  Last Pain:  Vitals:   07/30/18 1418  TempSrc:   PainSc: Asleep      Patients Stated Pain Goal: 0 (48/47/20 7218)  Complications: No apparent anesthesia complications

## 2018-07-30 NOTE — Anesthesia Preprocedure Evaluation (Addendum)
Anesthesia Evaluation  Patient identified by MRN, date of birth, ID band Patient awake    Reviewed: Allergy & Precautions, H&P , NPO status , Patient's Chart, lab work & pertinent test results  Airway Mallampati: II  TM Distance: >3 FB Neck ROM: full    Dental  (+) Partial Upper, Partial Lower   Pulmonary neg shortness of breath, neg COPD, neg recent URI, former smoker,  Lung cancer          Cardiovascular hypertension, (-) angina(-) Past MI, (-) Cardiac Stents, (-) CABG and (-) CHF (-) dysrhythmias      Neuro/Psych PSYCHIATRIC DISORDERS Depression negative neurological ROS     GI/Hepatic GERD  Controlled,(+) Cirrhosis  (per chart review)      ,   Endo/Other  negative endocrine ROS  Renal/GU      Musculoskeletal   Abdominal   Peds  Hematology  (+) Blood dyscrasia, anemia ,   Anesthesia Other Findings Past Medical History: No date: Arthritis No date: Cirrhosis (HCC) No date: Depression No date: GERD (gastroesophageal reflux disease) No date: Hyperlipidemia No date: Hypertension No date: Metastatic bone cancer [facility])  Past Surgical History: 1971: APPENDECTOMY 1971: CHOLECYSTECTOMY 11/15/2016: COLONOSCOPY WITH PROPOFOL; N/A     Comment:  Procedure: COLONOSCOPY WITH PROPOFOL;  Surgeon: Jonathon Bellows, MD;  Location: [facility] ENDOSCOPY;  Service:               Gastroenterology;  Laterality: N/A; 01/07/2017: ESOPHAGOGASTRODUODENOSCOPY (EGD) WITH PROPOFOL; N/A     Comment:  Procedure: ESOPHAGOGASTRODUODENOSCOPY (EGD) WITH               PROPOFOL;  Surgeon: Jonathon Bellows, MD;  Location: [facility]               ENDOSCOPY;  Service: Gastroenterology;  Laterality: N/A; 03/01/2017: LAPAROSCOPY; N/A     Comment:  Procedure: LAPAROSCOPY DIAGNOSTIC;  Surgeon: Robert Bellow, MD;  Location: [facility] ORS;  Service: General;                Laterality: N/A; 1955: TONSILECTOMY, ADENOIDECTOMY, BILATERAL  MYRINGOTOMY AND TUBES No date: TONSILLECTOMY 03/01/2017: VENTRAL HERNIA REPAIR; N/A     Comment:  10 x 14 CM Ventralight ST mesh, intraperitoneal               location.  03/01/2017: VENTRAL HERNIA REPAIR; N/A     Comment:  Procedure: HERNIA REPAIR VENTRAL ADULT;  Surgeon:               Robert Bellow, MD;  Location: [facility] ORS;  Service:               General;  Laterality: N/A;     Reproductive/Obstetrics negative OB ROS                            Anesthesia Physical Anesthesia Plan  ASA: III  Anesthesia Plan: General ETT   Post-op Pain Management:    Induction:   PONV Risk Score and Plan: Ondansetron, Dexamethasone, Midazolam and Treatment may vary due to age or medical condition  Airway Management Planned:   Additional Equipment:   Intra-op Plan:   Post-operative Plan:   Informed Consent: I have reviewed the patients History and Physical, chart, labs and discussed the procedure including the risks, benefits and alternatives for the  proposed anesthesia with the patient or authorized representative who has indicated his/her understanding and acceptance.     Dental Advisory Given  Plan Discussed with: Anesthesiologist and CRNA  Anesthesia Plan Comments:         Anesthesia Quick Evaluation

## 2018-07-30 NOTE — Op Note (Signed)
  [facility]            Bronchoscopy Note   FINDINGS/SUMMARY:   - Enlarged right paratracheal lymph node, EBUS guided lymph node sampling completed. - Enlarged right hilar lymph node with a narrow sampling window. EBUS guided lymph node sampling completed. - Transbronchial cytology brushing taken of the right upper lobe, bronchoalveolar lavage taken of the right upper lobe. -No endobronchial lesions noted.  Indication: Mediastinal lymphadenopathy.  Lung mass. The patient (or their representative) was informed of the risks (including but not limited to bleeding, infection, respiratory failure, lung injury, tooth/oral injury) and benefits of the procedure and gave consent, see chart.   Pre-op diagnosis: Mediastinal lymphadenopathy Post-op diagnosis: Same Estimated blood loss: 15 cc  Medications for procedure: Patient was sedated under general anesthesia for this procedure, please see anesthesia note for further details.  Procedure description: After obtaining informed consent, the time was called to confirm the patient and procedure.  Patient was intubated by anesthesia services, the EVIS bronchoscope was passed via the endotracheal tube to the right hilar node station.  This was a narrow node station window with a width of approximately 1 to 1.5 cm.  3 EBUS guided needle passes were made with the balloon inflated.  The balloon was then deflated, the EBUS scope was taken to the right paratracheal area, a large multi lobar lymph node was seen here, 5 passes were taken under EBUS guidance with good returns.  The scope was then removed. The white light bronchoscope was then advanced, an anatomical tour was undertaken, there was moderate mucosal secretions and edema throughout both lungs which were suctioned.  There were no endobronchial lesion seen. Bronchus was taken to the right upper lobe, transbronchial brushings were taken with cytology brush x3 for each of the segments  of the right upper lobe apical, anterior, posterior.  Bronchodilator lavage was then performed here of the right upper lobe with good returns, results sent for cytology and microbiology.    Condition post procedure: Stable   Complications: None noted   Deep Ashby Dawes, M.D., F.C.C.P. Board Certified in Internal Medicine, Pulmonary Medicine, [place], and Sleep Medicine.  [facility] Office Number: [phone]  07/30/2018

## 2018-07-30 NOTE — Anesthesia Post-op Follow-up Note (Signed)
Anesthesia QCDR form completed.        

## 2018-07-30 NOTE — Anesthesia Procedure Notes (Signed)
Procedure Name: Intubation Date/Time: 07/30/2018 1:25 PM Performed by: Allean Found, CRNA Pre-anesthesia Checklist: Patient identified, Patient being monitored, Timeout performed, Emergency Drugs available and Suction available Patient Re-evaluated:Patient Re-evaluated prior to induction Oxygen Delivery Method: Circle system utilized Preoxygenation: Pre-oxygenation with 100% oxygen Induction Type: IV induction Ventilation: Mask ventilation without difficulty Laryngoscope Size: Mac and 3 Grade View: Grade I Tube type: Oral Tube size: 8.0 mm Number of attempts: 1 Airway Equipment and Method: Stylet Placement Confirmation: ETT inserted through vocal cords under direct vision,  positive ETCO2 and breath sounds checked- equal and bilateral Secured at: 21 cm Tube secured with: Tape Dental Injury: Teeth and Oropharynx as per pre-operative assessment

## 2018-07-30 NOTE — Anesthesia Postprocedure Evaluation (Signed)
Anesthesia Post Note  Patient: Anne [name]  Procedure(s) Performed: ENDOBRONCHIAL ULTRASOUND (Right )  Patient location during evaluation: PACU Anesthesia Type: General Level of consciousness: awake and alert Pain management: pain level controlled Vital Signs Assessment: post-procedure vital signs reviewed and stable Respiratory status: spontaneous breathing, nonlabored ventilation, respiratory function stable and patient connected to nasal cannula oxygen Cardiovascular status: blood pressure returned to baseline and stable Postop Assessment: no apparent nausea or vomiting Anesthetic complications: no     Last Vitals:  Vitals:   07/30/18 1504 07/30/18 1513  BP: 136/72 140/66  Pulse: 81 82  Resp: 16 16  Temp: 36.4 C 36.4 C  SpO2: 99% 99%    Last Pain:  Vitals:   07/30/18 1513  TempSrc: Temporal  PainSc: 0-No pain                 Clorissa Gruenberg S

## 2018-07-31 ENCOUNTER — Other Ambulatory Visit: Payer: Self-pay

## 2018-07-31 ENCOUNTER — Ambulatory Visit
Admission: RE | Admit: 2018-07-31 | Discharge: 2018-07-31 | Disposition: A | Discharge status: Home / Self Care | Payer: PPO | Source: Ambulatory Visit | Attending: Radiation Oncology | Admitting: Radiation Oncology

## 2018-07-31 ENCOUNTER — Encounter: Payer: Self-pay | Admitting: Internal Medicine

## 2018-07-31 DIAGNOSIS — Z510 Encounter for antineoplastic radiation therapy: Secondary | ICD-10-CM | POA: Diagnosis not present

## 2018-07-31 LAB — ACID FAST SMEAR (AFB, MYCOBACTERIA): Acid Fast Smear: NEGATIVE

## 2018-08-01 ENCOUNTER — Ambulatory Visit: Payer: PPO | Admitting: Family Medicine

## 2018-08-01 ENCOUNTER — Other Ambulatory Visit: Payer: Self-pay

## 2018-08-01 ENCOUNTER — Ambulatory Visit
Admission: RE | Admit: 2018-08-01 | Discharge: 2018-08-01 | Disposition: A | Discharge status: Home / Self Care | Payer: PPO | Source: Ambulatory Visit | Attending: Radiation Oncology | Admitting: Radiation Oncology

## 2018-08-01 DIAGNOSIS — Z510 Encounter for antineoplastic radiation therapy: Secondary | ICD-10-CM | POA: Diagnosis not present

## 2018-08-01 LAB — CULTURE, BAL-QUANTITATIVE W GRAM STAIN
Culture: 5000 — AB
Special Requests: NORMAL

## 2018-08-01 LAB — CYTOLOGY - NON PAP

## 2018-08-03 ENCOUNTER — Other Ambulatory Visit: Payer: Self-pay | Admitting: Family Medicine

## 2018-08-04 ENCOUNTER — Ambulatory Visit
Admission: RE | Admit: 2018-08-04 | Discharge: 2018-08-04 | Disposition: A | Discharge status: Home / Self Care | Payer: PPO | Source: Ambulatory Visit | Attending: Radiation Oncology | Admitting: Radiation Oncology

## 2018-08-04 ENCOUNTER — Ambulatory Visit: Payer: PPO | Admitting: Oncology

## 2018-08-04 ENCOUNTER — Encounter: Payer: Self-pay | Admitting: *Deleted

## 2018-08-04 ENCOUNTER — Other Ambulatory Visit: Payer: Self-pay

## 2018-08-04 DIAGNOSIS — Z510 Encounter for antineoplastic radiation therapy: Secondary | ICD-10-CM | POA: Diagnosis not present

## 2018-08-04 NOTE — Progress Notes (Signed)
Oncology Nurse Navigator Documentation  Navigator Location: [facility] (08/04/18 0800)   )Navigator Encounter Type: Follow-up Appt (08/04/18 0800)     Confirmed Diagnosis Date: 08/01/18 (08/04/18 0800)               Patient Visit Type: MedOnc (08/04/18 0800) Treatment Phase: Pre-Tx/Tx Discussion (08/04/18 0800) Barriers/Navigation Needs: Coordination of Care;Education (08/04/18 0800) Education: Newly Diagnosed Cancer Education;Understanding Cancer/ Treatment Options (08/04/18 0800) Interventions: Coordination of Care;Education (08/04/18 0800)   Coordination of Care: Appts;Chemo (08/04/18 0800) Education Method: Verbal;Written (08/04/18 0800)       met with patient during follow up visit with Dr. Janese Banks to discuss biopsy results and treatment options. All questions answered during visit. Pt given resources regarding diagnosis and supportive services available. Pt left prior to reviewing upcoming appts stating that she will look on her MyChart for the appts. Will follow up with pt tomorrow morning before chemo class to further review and answer any further questions regarding her appts. Nothing further needed at this time.          Time Spent with Patient: 45 (08/04/18 0800)

## 2018-08-05 ENCOUNTER — Encounter: Payer: Self-pay | Admitting: Oncology

## 2018-08-05 ENCOUNTER — Other Ambulatory Visit: Payer: Self-pay

## 2018-08-05 ENCOUNTER — Inpatient Hospital Stay (HOSPITAL_BASED_OUTPATIENT_CLINIC_OR_DEPARTMENT_OTHER): Payer: PPO | Admitting: Oncology

## 2018-08-05 ENCOUNTER — Ambulatory Visit
Admission: RE | Admit: 2018-08-05 | Discharge: 2018-08-05 | Disposition: A | Discharge status: Home / Self Care | Payer: PPO | Source: Ambulatory Visit | Attending: Radiation Oncology | Admitting: Radiation Oncology

## 2018-08-05 VITALS — BP 161/93 | HR 94 | Temp 97.3°F | Resp 18 | Wt 180.1 lb

## 2018-08-05 DIAGNOSIS — R590 Localized enlarged lymph nodes: Secondary | ICD-10-CM

## 2018-08-05 DIAGNOSIS — Z8041 Family history of malignant neoplasm of ovary: Secondary | ICD-10-CM

## 2018-08-05 DIAGNOSIS — M545 Low back pain: Secondary | ICD-10-CM | POA: Diagnosis not present

## 2018-08-05 DIAGNOSIS — I700 Atherosclerosis of aorta: Secondary | ICD-10-CM

## 2018-08-05 DIAGNOSIS — Z87891 Personal history of nicotine dependence: Secondary | ICD-10-CM | POA: Diagnosis not present

## 2018-08-05 DIAGNOSIS — C801 Malignant (primary) neoplasm, unspecified: Secondary | ICD-10-CM | POA: Diagnosis not present

## 2018-08-05 DIAGNOSIS — M48061 Spinal stenosis, lumbar region without neurogenic claudication: Secondary | ICD-10-CM | POA: Diagnosis not present

## 2018-08-05 DIAGNOSIS — M533 Sacrococcygeal disorders, not elsewhere classified: Secondary | ICD-10-CM

## 2018-08-05 DIAGNOSIS — R5383 Other fatigue: Secondary | ICD-10-CM | POA: Diagnosis not present

## 2018-08-05 DIAGNOSIS — Z8249 Family history of ischemic heart disease and other diseases of the circulatory system: Secondary | ICD-10-CM

## 2018-08-05 DIAGNOSIS — E785 Hyperlipidemia, unspecified: Secondary | ICD-10-CM

## 2018-08-05 DIAGNOSIS — C7951 Secondary malignant neoplasm of bone: Secondary | ICD-10-CM | POA: Diagnosis not present

## 2018-08-05 DIAGNOSIS — M47816 Spondylosis without myelopathy or radiculopathy, lumbar region: Secondary | ICD-10-CM | POA: Diagnosis not present

## 2018-08-05 DIAGNOSIS — I2510 Atherosclerotic heart disease of native coronary artery without angina pectoris: Secondary | ICD-10-CM

## 2018-08-05 DIAGNOSIS — G893 Neoplasm related pain (acute) (chronic): Secondary | ICD-10-CM

## 2018-08-05 DIAGNOSIS — M8448XA Pathological fracture, other site, initial encounter for fracture: Secondary | ICD-10-CM

## 2018-08-05 DIAGNOSIS — K7460 Unspecified cirrhosis of liver: Secondary | ICD-10-CM

## 2018-08-05 DIAGNOSIS — M25551 Pain in right hip: Secondary | ICD-10-CM | POA: Diagnosis not present

## 2018-08-05 DIAGNOSIS — C3411 Malignant neoplasm of upper lobe, right bronchus or lung: Secondary | ICD-10-CM

## 2018-08-05 DIAGNOSIS — J439 Emphysema, unspecified: Secondary | ICD-10-CM

## 2018-08-05 DIAGNOSIS — Z7189 Other specified counseling: Secondary | ICD-10-CM

## 2018-08-05 DIAGNOSIS — F329 Major depressive disorder, single episode, unspecified: Secondary | ICD-10-CM

## 2018-08-05 DIAGNOSIS — Z510 Encounter for antineoplastic radiation therapy: Secondary | ICD-10-CM | POA: Diagnosis not present

## 2018-08-05 DIAGNOSIS — Z79899 Other long term (current) drug therapy: Secondary | ICD-10-CM

## 2018-08-05 DIAGNOSIS — D649 Anemia, unspecified: Secondary | ICD-10-CM

## 2018-08-05 DIAGNOSIS — K5900 Constipation, unspecified: Secondary | ICD-10-CM

## 2018-08-05 DIAGNOSIS — Z791 Long term (current) use of non-steroidal anti-inflammatories (NSAID): Secondary | ICD-10-CM

## 2018-08-05 DIAGNOSIS — I10 Essential (primary) hypertension: Secondary | ICD-10-CM

## 2018-08-05 DIAGNOSIS — R911 Solitary pulmonary nodule: Secondary | ICD-10-CM

## 2018-08-05 DIAGNOSIS — Z823 Family history of stroke: Secondary | ICD-10-CM

## 2018-08-05 NOTE — Progress Notes (Signed)
Patient here for follow up. Pt concerned about back pain, states its 5-6 when walking.

## 2018-08-06 ENCOUNTER — Other Ambulatory Visit: Payer: Self-pay | Admitting: Oncology

## 2018-08-06 ENCOUNTER — Inpatient Hospital Stay: Payer: PPO | Attending: Oncology

## 2018-08-06 ENCOUNTER — Other Ambulatory Visit: Payer: Self-pay

## 2018-08-06 ENCOUNTER — Telehealth (INDEPENDENT_AMBULATORY_CARE_PROVIDER_SITE_OTHER): Payer: Self-pay

## 2018-08-06 ENCOUNTER — Encounter (INDEPENDENT_AMBULATORY_CARE_PROVIDER_SITE_OTHER): Payer: Self-pay

## 2018-08-06 ENCOUNTER — Inpatient Hospital Stay (HOSPITAL_BASED_OUTPATIENT_CLINIC_OR_DEPARTMENT_OTHER): Payer: PPO | Admitting: Oncology

## 2018-08-06 ENCOUNTER — Inpatient Hospital Stay: Payer: PPO

## 2018-08-06 ENCOUNTER — Ambulatory Visit
Admission: RE | Admit: 2018-08-06 | Discharge: 2018-08-06 | Disposition: A | Discharge status: Home / Self Care | Payer: PPO | Source: Ambulatory Visit | Attending: Radiation Oncology | Admitting: Radiation Oncology

## 2018-08-06 DIAGNOSIS — R197 Diarrhea, unspecified: Secondary | ICD-10-CM | POA: Diagnosis not present

## 2018-08-06 DIAGNOSIS — C3490 Malignant neoplasm of unspecified part of unspecified bronchus or lung: Secondary | ICD-10-CM | POA: Insufficient documentation

## 2018-08-06 DIAGNOSIS — I700 Atherosclerosis of aorta: Secondary | ICD-10-CM

## 2018-08-06 DIAGNOSIS — Z79899 Other long term (current) drug therapy: Secondary | ICD-10-CM | POA: Insufficient documentation

## 2018-08-06 DIAGNOSIS — K7460 Unspecified cirrhosis of liver: Secondary | ICD-10-CM

## 2018-08-06 DIAGNOSIS — Z87891 Personal history of nicotine dependence: Secondary | ICD-10-CM | POA: Insufficient documentation

## 2018-08-06 DIAGNOSIS — R5383 Other fatigue: Secondary | ICD-10-CM | POA: Insufficient documentation

## 2018-08-06 DIAGNOSIS — D649 Anemia, unspecified: Secondary | ICD-10-CM

## 2018-08-06 DIAGNOSIS — J449 Chronic obstructive pulmonary disease, unspecified: Secondary | ICD-10-CM

## 2018-08-06 DIAGNOSIS — E876 Hypokalemia: Secondary | ICD-10-CM | POA: Diagnosis not present

## 2018-08-06 DIAGNOSIS — M549 Dorsalgia, unspecified: Secondary | ICD-10-CM | POA: Insufficient documentation

## 2018-08-06 DIAGNOSIS — I2510 Atherosclerotic heart disease of native coronary artery without angina pectoris: Secondary | ICD-10-CM | POA: Insufficient documentation

## 2018-08-06 DIAGNOSIS — Z7189 Other specified counseling: Secondary | ICD-10-CM | POA: Insufficient documentation

## 2018-08-06 DIAGNOSIS — C7951 Secondary malignant neoplasm of bone: Secondary | ICD-10-CM | POA: Diagnosis not present

## 2018-08-06 DIAGNOSIS — I6782 Cerebral ischemia: Secondary | ICD-10-CM

## 2018-08-06 DIAGNOSIS — Z8249 Family history of ischemic heart disease and other diseases of the circulatory system: Secondary | ICD-10-CM

## 2018-08-06 DIAGNOSIS — Z5111 Encounter for antineoplastic chemotherapy: Secondary | ICD-10-CM | POA: Diagnosis not present

## 2018-08-06 DIAGNOSIS — C801 Malignant (primary) neoplasm, unspecified: Secondary | ICD-10-CM | POA: Insufficient documentation

## 2018-08-06 DIAGNOSIS — Z923 Personal history of irradiation: Secondary | ICD-10-CM | POA: Diagnosis not present

## 2018-08-06 DIAGNOSIS — F329 Major depressive disorder, single episode, unspecified: Secondary | ICD-10-CM

## 2018-08-06 DIAGNOSIS — T451X5A Adverse effect of antineoplastic and immunosuppressive drugs, initial encounter: Secondary | ICD-10-CM | POA: Insufficient documentation

## 2018-08-06 DIAGNOSIS — E785 Hyperlipidemia, unspecified: Secondary | ICD-10-CM | POA: Diagnosis not present

## 2018-08-06 DIAGNOSIS — Z510 Encounter for antineoplastic radiation therapy: Secondary | ICD-10-CM | POA: Diagnosis not present

## 2018-08-06 DIAGNOSIS — Z8041 Family history of malignant neoplasm of ovary: Secondary | ICD-10-CM

## 2018-08-06 DIAGNOSIS — Z5189 Encounter for other specified aftercare: Secondary | ICD-10-CM | POA: Diagnosis not present

## 2018-08-06 DIAGNOSIS — G893 Neoplasm related pain (acute) (chronic): Secondary | ICD-10-CM | POA: Insufficient documentation

## 2018-08-06 DIAGNOSIS — C3411 Malignant neoplasm of upper lobe, right bronchus or lung: Secondary | ICD-10-CM | POA: Insufficient documentation

## 2018-08-06 DIAGNOSIS — Z823 Family history of stroke: Secondary | ICD-10-CM

## 2018-08-06 DIAGNOSIS — Z5112 Encounter for antineoplastic immunotherapy: Secondary | ICD-10-CM | POA: Diagnosis not present

## 2018-08-06 DIAGNOSIS — I10 Essential (primary) hypertension: Secondary | ICD-10-CM | POA: Insufficient documentation

## 2018-08-06 DIAGNOSIS — D696 Thrombocytopenia, unspecified: Secondary | ICD-10-CM | POA: Insufficient documentation

## 2018-08-06 HISTORY — DX: Malignant neoplasm of unspecified part of unspecified bronchus or lung: C34.90

## 2018-08-06 LAB — BASIC METABOLIC PANEL
Anion gap: 9 (ref 5–15)
BUN: 12 mg/dL (ref 8–23)
CO2: 30 mmol/L (ref 22–32)
Calcium: 8.7 mg/dL — ABNORMAL LOW (ref 8.9–10.3)
Chloride: 102 mmol/L (ref 98–111)
Creatinine, Ser: 0.64 mg/dL (ref 0.44–1.00)
GFR calc Af Amer: >60 mL/min (ref >60)
GFR calc non Af Amer: >60 mL/min (ref >60)
Glucose, Bld: 104 mg/dL — ABNORMAL HIGH (ref 70–99)
Potassium: 2.7 mmol/L — CL (ref 3.5–5.1)
Sodium: 141 mmol/L (ref 135–145)

## 2018-08-06 MED ORDER — PROCHLORPERAZINE MALEATE 10 MG PO TABS: 10.0000 mg | ORAL_TABLET | Freq: Four times a day (QID) | ORAL | 1 refills | Status: DC | PRN

## 2018-08-06 MED ORDER — LORAZEPAM 0.5 MG PO TABS: 0.5000 mg | ORAL_TABLET | Freq: Four times a day (QID) | ORAL | 0 refills | Status: DC | PRN

## 2018-08-06 MED ORDER — LIDOCAINE-PRILOCAINE 2.5-2.5 % EX CREA: TOPICAL_CREAM | CUTANEOUS | 3 refills | Status: DC

## 2018-08-06 MED ORDER — SODIUM CHLORIDE 0.9 % IV SOLN
20.0000 meq | Freq: Once | INTRAVENOUS | Status: AC
  Administered 2018-08-06: 13:00:00 20 meq via INTRAVENOUS
  Filled 2018-08-06: qty 10

## 2018-08-06 MED ORDER — ONDANSETRON HCL 8 MG PO TABS: 8.0000 mg | ORAL_TABLET | Freq: Two times a day (BID) | ORAL | 1 refills | Status: DC | PRN

## 2018-08-06 MED ORDER — POTASSIUM CHLORIDE CRYS ER 20 MEQ PO TBCR: 20.0000 meq | EXTENDED_RELEASE_TABLET | Freq: Every day | ORAL | 0 refills | Status: DC

## 2018-08-06 NOTE — Progress Notes (Signed)
START ON PATHWAY REGIMEN - Small Cell Lung     Cycles 1 through 4, every 21 days:     Atezolizumab      Carboplatin      Etoposide    Cycles 5 and beyond, every 21 days:     Atezolizumab   **Always confirm dose/schedule in your pharmacy ordering system**  Patient Characteristics: Newly Diagnosed, Preoperative or Nonsurgical Candidate (Clinical Staging), First Line, Extensive Stage Therapeutic Status: Newly Diagnosed, Preoperative or Nonsurgical Candidate (Clinical Staging) AJCC T Category: cT3 AJCC N Category: cN2 AJCC M Category: cM1c AJCC 8 Stage Grouping: IVB Stage Classification: Extensive Intent of Therapy: Non-Curative / Palliative Intent, Discussed with Patient

## 2018-08-06 NOTE — Telephone Encounter (Signed)
Patient is scheduled for a procedure with Dr. Lucky Cowboy for 08/14/2018 with an arrival time of 6:45 am at the [facility]. Patient will need be NPO after midnight and can take all medications with small sips of water. She will need to have Covid-19 testing on 08/08/2018 at the [facility] between 12:30-2:30 pm. It is drive up. This information will be mailed out to the patient as well. I was told she doesn't  remember well so I will just mail the information to her and not call to keep confusion down. If you have any questions please call our office at [phone] to speak with me Mickel Baas.     Thank you

## 2018-08-06 NOTE — Progress Notes (Signed)
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  She has been referred to [place] for findings of bone metastases and her recent MRI.  She has a prior history of 3 packs/day day smoking for over 45 years and quit smoking 5 years ago.She had a CT chest abdomen pelvis in 2018 which showed a 5 mm lung nodule in the left lower lobe.  Recently over the last 2 months patient has been having worsening back pain and was referred to orthopedics.  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  Pathologic fracture of S1.  PET scan showed 2 RUL lung nodules, hilar and mediastinal adenopathy and widespread bone mets. MRI brain negative.  Patient is receiving palliative RT to her spine. Bronchoscopy showed small cell lung cancer  Interval history- she will be completing RT next week. She still has some back pain which is relatively well controlled on oxycodone. Denies other complaints at this time  ECOG PS- 1 Pain scale- 4 Opioid associated constipation- no  Review of systems- Review of Systems  Constitutional: Positive for malaise/fatigue. Negative for chills,  fever and weight loss.  HENT: Negative for congestion, ear discharge and nosebleeds.   Eyes: Negative for blurred vision.  Respiratory: Negative for cough, hemoptysis, sputum production, shortness of breath and wheezing.   Cardiovascular: Negative for chest pain, palpitations, orthopnea and claudication.  Gastrointestinal: Negative for abdominal pain, blood in stool, constipation, diarrhea, heartburn, melena, nausea and vomiting.  Genitourinary: Negative for dysuria, flank pain, frequency, hematuria and urgency.  Musculoskeletal: Positive for back pain. Negative for joint pain and myalgias.  Skin: Negative for rash.  Neurological: Negative for dizziness, tingling, focal weakness, seizures, weakness and headaches.  Endo/Heme/Allergies: Does not bruise/bleed easily.  Psychiatric/Behavioral: Negative for depression and suicidal ideas. The patient does not have insomnia.        Allergies  Allergen Reactions   Bee Venom Swelling     Past Medical History:  Diagnosis Date   Arthritis    Cirrhosis ([place])    Depression    GERD (gastroesophageal reflux disease)    Hyperlipidemia    Hypertension    Metastatic bone cancer [facility])      Past Surgical History:  Procedure Laterality Date   APPENDECTOMY  1971   CHOLECYSTECTOMY  1971   COLONOSCOPY WITH PROPOFOL N/A 11/15/2016   Procedure: COLONOSCOPY WITH PROPOFOL;  Surgeon: Jonathon Bellows, MD;  Location: [facility] ENDOSCOPY;  Service: Gastroenterology;  Laterality: N/A;   ENDOBRONCHIAL ULTRASOUND Right 07/30/2018   Procedure: ENDOBRONCHIAL ULTRASOUND;  Surgeon: Laverle Hobby, MD;  Location: [facility] ORS;  Service: Pulmonary;  Laterality: Right;   ESOPHAGOGASTRODUODENOSCOPY (EGD) WITH PROPOFOL N/A 01/07/2017   Procedure: ESOPHAGOGASTRODUODENOSCOPY (EGD) WITH PROPOFOL;  Surgeon: Jonathon Bellows, MD;  Location: [facility] ENDOSCOPY;  Service: Gastroenterology;  Laterality: N/A;   LAPAROSCOPY N/A 03/01/2017   Procedure: LAPAROSCOPY DIAGNOSTIC;   Surgeon: Robert Bellow, MD;  Location: [facility] ORS;  Service: General;  Laterality: N/A;   TONSILECTOMY, ADENOIDECTOMY, BILATERAL MYRINGOTOMY AND [place] N/A 03/01/2017   10 x 14 CM Ventralight ST mesh, intraperitoneal location.    VENTRAL HERNIA REPAIR N/A 03/01/2017   Procedure: HERNIA REPAIR VENTRAL ADULT;  Surgeon: Robert Bellow, MD;  Location: [facility] ORS;  Service: General;  Laterality: N/A;    Social History   Socioeconomic History   Marital status: Married    Spouse name: Not on file   Number of children: Not on file   Years of education: Not on file   Highest education level: Not on file  Occupational History   Not on file  Social Needs   Financial resource strain: Not hard at all   Food insecurity    Worry: Never true    Inability: Never true   Transportation needs    Medical: No    Non-medical: No  Tobacco Use   Smoking status: Former Smoker    Packs/day: 2.00    Years: 50.00    Pack years: 100.00    Types: Cigarettes    Quit date: 02/07/2012    Years since quitting: 6.4   Smokeless tobacco: Never Used   Tobacco comment: uses vape without nicotine, one capsule every 3 months.  Substance and Sexual Activity   Alcohol use: No    Alcohol/week: 0.0 standard drinks   Drug use: No   Sexual activity: Yes  Lifestyle   Physical activity    Days per week: Not on file    Minutes per session: Not on file   Stress: Not at all  Relationships   Social connections    Talks on phone: Not on file    Gets together: Not on file    Attends religious service: Not on file    Active member of club or organization: Not on file    Attends meetings of clubs or organizations: Not on file    Relationship status: Not on file   Intimate partner violence    Fear of current or ex partner: No    Emotionally abused: No    Physically abused: No    Forced sexual activity: No  Other Topics Concern   Not on file    Social History Narrative   Married   Retired   Clinical cytogeneticist level of education    No children    1 cup of coffee    Family History  Problem Relation Age of Onset   Hypertension Mother    Ovarian cancer Mother 60   Heart disease Father    Stroke Father    Ovarian cancer Sister        ? dx cancer had hyst.   Breast cancer Neg Hx      Current Outpatient Medications:    atorvastatin (LIPITOR) 10 MG tablet, Take 10 mg by mouth daily., Disp: , Rfl:    buPROPion (WELLBUTRIN XL) 300 MG 24 hr tablet, Take 1 tablet (300 mg total) by mouth daily., Disp: 90 tablet, Rfl: 1   escitalopram (LEXAPRO) 10 MG tablet, Take 1 tablet (10 mg total) by mouth daily., Disp: 90 tablet, Rfl: 1   ibuprofen (ADVIL,MOTRIN) 200 MG tablet, Take 400 mg by mouth every 8 (eight) hours as needed for headache or moderate pain. ,  Disp: , Rfl:    Multiple Vitamins-Minerals (PRESERVISION AREDS 2) CAPS, Take 1 capsule by mouth 2 (two) times a day. , Disp: , Rfl:    oxyCODONE (OXY IR/ROXICODONE) 5 MG immediate release tablet, Take 1 tablet (5 mg total) by mouth every 6 (six) hours as needed for severe pain., Disp: 120 tablet, Rfl: 0   pantoprazole (PROTONIX) 40 MG tablet, TAKE 1 TABLET BY MOUTH TWICE DAILY BEFORE A MEAL (Patient taking differently: Take 40 mg by mouth 2 (two) times daily before a meal. ), Disp: 180 tablet, Rfl: 0   rosuvastatin (CRESTOR) 20 MG tablet, TAKE 1 TABLET BY MOUTH ONCE DAILY, Disp: 90 tablet, Rfl: 3   lidocaine-prilocaine (EMLA) cream, Apply to affected area once, Disp: 30 g, Rfl: 3   LORazepam (ATIVAN) 0.5 MG tablet, Take 1 tablet (0.5 mg total) by mouth every 6 (six) hours as needed (Nausea or vomiting)., Disp: 30 tablet, Rfl: 0   ondansetron (ZOFRAN) 8 MG tablet, Take 1 tablet (8 mg total) by mouth 2 (two) times daily as needed for refractory nausea / vomiting. Start on day 3 after carboplatin chemo., Disp: 30 tablet, Rfl: 1   potassium chloride SA (K-DUR) 20 MEQ tablet,  Take 1 tablet (20 mEq total) by mouth daily., Disp: 10 tablet, Rfl: 0   prochlorperazine (COMPAZINE) 10 MG tablet, Take 1 tablet (10 mg total) by mouth every 6 (six) hours as needed (Nausea or vomiting)., Disp: 30 tablet, Rfl: 1 No current facility-administered medications for this visit.   Facility-Administered Medications Ordered in Other Visits:    potassium chloride 20 mEq in sodium chloride 0.9 % 510 mL (0.0392 mEq/mL) infusion - Peripheral Line, 20 mEq, Intravenous, Once, Jacquelin Hawking, NP  Physical exam:  Vitals:   08/05/18 1409  BP: (!) 161/93  Pulse: 94  Resp: 18  Temp: (!) 97.3 F (36.3 C)  Weight: 180 lb 1.9 oz (81.7 kg)   Physical Exam HENT:     Head: Normocephalic and atraumatic.  Eyes:     Pupils: Pupils are equal, round, and reactive to light.  Neck:     Musculoskeletal: Normal range of motion.  Cardiovascular:     Rate and Rhythm: Normal rate and regular rhythm.     Heart sounds: Normal heart sounds.  Pulmonary:     Effort: Pulmonary effort is normal.     Breath sounds: Normal breath sounds.  Abdominal:     General: Bowel sounds are normal.     Palpations: Abdomen is soft.  Skin:    General: Skin is warm and dry.  Neurological:     Mental Status: She is alert and oriented to person, place, and time.      CMP Latest Ref Rng & Units 08/06/2018  Glucose 70 - 99 mg/dL 104(H)  BUN 8 - 23 mg/dL 12  Creatinine 0.44 - 1.00 mg/dL 0.64  Sodium 135 - 145 mmol/L 141  Potassium 3.5 - 5.1 mmol/L 2.7(LL)  Chloride 98 - 111 mmol/L 102  CO2 22 - 32 mmol/L 30  Calcium 8.9 - 10.3 mg/dL 8.7(L)  Total Protein 6.5 - 8.1 g/dL -  Total Bilirubin 0.3 - 1.2 mg/dL -  Alkaline Phos 38 - 126 U/L -  AST 15 - 41 U/L -  ALT 0 - 44 U/L -   CBC Latest Ref Rng & Units 07/30/2018  WBC 4.0 - 10.5 K/uL 4.2  Hemoglobin 12.0 - 15.0 g/dL 11.3(L)  Hematocrit 36.0 - 46.0 % 37.2  Platelets 150 - 400 K/uL  161       Dg Chest 1 View  Result Date: 07/30/2018 CLINICAL DATA:   Metastatic cancer. Status post bronchoscopy with biopsies. EXAM: CHEST  1 VIEW COMPARISON:  PET-CT dated 07/15/2018 FINDINGS: There is no evidence of pneumothorax or pulmonary hemorrhage or other acute abnormality. No infiltrates or effusions. Mediastinal adenopathy as noted on the prior PET scan. The pulmonary nodule in the right upper lobe posterior medially is obscured by the mediastinal enlargement. No discrete metastatic disease to the skeleton. Old deformity of the posterior aspect of the left seventh rib consistent with an old fracture present on the prior PET-CT. IMPRESSION: No pneumothorax or pulmonary hemorrhage after bronchoscopy with biopsies. Mediastinal adenopathy. Electronically Signed   By: Lorriane Shire M.D.   On: 07/30/2018 15:16   Mr Jeri Cos IO Contrast  Result Date: 07/29/2018 CLINICAL DATA:  Abnormal PET, lung mass. Presumed lung cancer. Staging EXAM: MRI HEAD WITHOUT AND WITH CONTRAST TECHNIQUE: Multiplanar, multiecho pulse sequences of the brain and surrounding structures were obtained without and with intravenous contrast. CONTRAST:  7.5 mL Gadovist IV COMPARISON:  MRI head 03/20/2017 FINDINGS: Brain: Ventricle size and cerebral volume normal. Negative for acute infarct. Scattered small white matter hyperintensities most consistent with chronic microvascular ischemia. Negative for hemorrhage mass or edema Normal enhancement.  No enhancing metastatic deposits identified. Vascular: Normal arterial flow voids. Skull and upper cervical spine: No worrisome skull lesions identified. Sinuses/Orbits: Paranasal sinuses clear.  Bilateral cataract surgery Other: None IMPRESSION: No acute abnormality and  negative for metastatic disease. Mild chronic microvascular ischemia in the white matter. Electronically Signed   By: Franchot Gallo M.D.   On: 07/29/2018 11:13   Nm Pet Image Initial (pi) Skull Base To Thigh  Result Date: 07/15/2018 CLINICAL DATA:  Initial treatment strategy for bone metastasis  on lumbar spine MRI. EXAM: NUCLEAR MEDICINE PET SKULL BASE TO THIGH TECHNIQUE: 9.9 mCi F-18 FDG was injected intravenously. Full-ring PET imaging was performed from the skull base to thigh after the radiotracer. CT data was obtained and used for attenuation correction and anatomic localization. Fasting blood glucose: 85 mg/dl COMPARISON:  Lumbar spine MRI of 07/04/2018. 01/02/2017 chest abdomen and pelvic CTs. FINDINGS: Mediastinal blood pool activity: SUV max 2.6 Liver activity: SUV max NA NECK: No areas of abnormal hypermetabolism. Incidental CT findings: No cervical adenopathy. CHEST: Prevascular nodal mass at 2.7 cm and a S.U.V. max of 12.5 on image 76/3. Right paratracheal nodal mass measures 3.6 cm and a S.U.V. max of 13.1 on image 82/3. Two adjacent versus 1 bilobed posterior right upper lobe pulmonary nodule/nodules. Example posteriorly at 1.5 cm and a S.U.V. max of 10.1 on image 79/3. Anteriorly and inferiorly at 1.5 cm and a S.U.V. max of 9.5 on image 83/3. Right hilar nodal hypermetabolism at a S.U.V. max of 11.7. Incidental CT findings: Aortic and multivessel coronary artery atherosclerosis. Pulmonary artery enlargement, including a 2.9 cm right pulmonary artery. Mild centrilobular emphysema. Superior segment left lower lobe pulmonary nodule of 5 mm on image 92/3, present on 01/02/2017. ABDOMEN/PELVIS: No abdominopelvic parenchymal or nodal hypermetabolism. Probable postsurgical hypermetabolism about the anterior abdominal wall, including at a S.U.V. max of 5.8 on image 167/3. Incidental CT findings: Moderate cirrhosis. Cholecystectomy. 6 mm complex lower pole right renal lesion is of doubtful clinical significance. Abdominal aortic atherosclerosis with nonaneurysmal dilatation of the infrarenal segment at 2.2 cm. Large colonic stool burden. Pelvic floor laxity. SKELETON: Multifocal osseous metastasis. L2 hypermetabolic metastasis with pathologic fracture. This measures a S.U.V. max of 12.3.  An index  right iliac lytic lesion measures 2.7 cm and a S.U.V. max of 14.1 on 205/3. Incidental CT findings: Healing posterolateral left seventh rib fracture. IMPRESSION: 1. Right upper lobe pulmonary nodule or nodules with hypermetabolic thoracic adenopathy. 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 2. Widespread osseous metastasis, as on prior MRI. 3. Aortic atherosclerosis (ICD10-I70.0), coronary artery atherosclerosis and emphysema (ICD10-J43.9). 4. Pulmonary artery enlargement suggests pulmonary arterial hypertension. 5. Cirrhosis. Electronically Signed   By: Abigail Miyamoto M.D.   On: 07/15/2018 16:47     Assessment and plan- Patient is a 68 y.o. female with new diagnosis of extensive stage small cell lung cancer  I discussed the results of pathology with the patient in detail as well as pet ct. I have reviewed pet ct images independently. Patient has 2 separate RUL lung nodules (T3), mediastinal adenopathy (N2) and multiple bone mets (M1). She therefore has stage IV extensive stage SCLC.  I recommend 4 cycles of carboplatin AUC 5 IV on day 1, Etoposide 100 mg/ meter square D1-D3 along with tecentriq IV D1 Q3 weeks for 4 cycles followed by maintenance tecntriq aftet scans until progression or toxicity.   I explained to the patient the risks and benefits of chemotherapy including all but not limited to nausea, vomiting, low blood counts and risk of infection and hospitalization. Risk of peripheral neuropathy associated with carboplatin. Patient understands and agrees to proceed as planned. Also discussed risks and benefits of immunotherapy including all but not limited to autoimmune colitis, pneumonitis, need to monitor kidney and liver functions. Treatment will be given with a palliative intent. Patient understands and agrees to proceed as planned.   She will need port placement, chemo teach and we will plan to start chemo on 08/18/18 and I will see her on that day  Consider wbrt down the  line if she has good response to Rx.  Will discuss bisphosphonate use at next visit for her bone mets.  Cancer Staging Small cell lung cancer, right upper lobe [facility]) Staging form: Lung, AJCC 8th Edition - Clinical stage from 08/05/2018: Stage IVB (cT3, cN2, cM1c) - Signed by Sindy Guadeloupe, MD on 08/06/2018     Visit Diagnosis 1. Small cell lung cancer, right upper lobe ([place])   2. Bone metastases ([place])   3. Goals of care, counseling/discussion      Dr. Randa Evens, MD, MPH [facility] [phone] 08/06/2018 12:48 PM

## 2018-08-06 NOTE — Progress Notes (Signed)
RX potassium 20 mEq daily X 10 days sent to pharmacy.  Patient coming to clinic this afternoon for IV potassium.  Patient does not have a port.   Patient notified.  Faythe Casa, NP 08/06/2018 12:03 PM

## 2018-08-06 NOTE — Progress Notes (Signed)
Symptom Management Consult note [facility]  [phone] [phone]) [phone]  Patient Care Team: Leone Haven, MD as PCP - General (Family Medicine) Leone Haven, MD as Consulting Physician (Family Medicine) Bary Castilla, Forest Gleason, MD (General Surgery) Telford Nab, RN as Registered Nurse   Name of the patient: Anne [name]  [id number]  08/26/1950   Date of visit: 08/06/2018   Diagnosis-new diagnosis of lung cancer with mets to the bone  Chief complaint/ Reason for visit-diarrhea  Heme/Onc history:  Oncology History  Small cell lung cancer, right upper lobe ([place])  08/05/2018 Cancer Staging   Staging form: Lung, AJCC 8th Edition - Clinical stage from 08/05/2018: Stage IVB (cT3, cN2, cM1c) - Signed by Sindy Guadeloupe, MD on 08/06/2018   08/06/2018 Initial Diagnosis   Small cell lung cancer, right upper lobe ([place])   08/18/2018 -  Chemotherapy   The patient had palonosetron (ALOXI) injection 0.25 mg, 0.25 mg, Intravenous,  Once, 0 of 4 cycles pegfilgrastim (NEULASTA ONPRO KIT) injection 6 mg, 6 mg, Subcutaneous, Once, 0 of 4 cycles CARBOplatin (PARAPLATIN) 460 mg in sodium chloride 0.9 % 250 mL chemo infusion, 460 mg (original dose ), Intravenous,  Once, 0 of 4 cycles Dose modification:   (Cycle 1) etoposide (VEPESID) 190 mg in sodium chloride 0.9 % 500 mL chemo infusion, 100 mg/m2, Intravenous,  Once, 0 of 4 cycles fosaprepitant (EMEND) 150 mg, dexamethasone (DECADRON) 12 mg in sodium chloride 0.9 % 145 mL IVPB, , Intravenous,  Once, 0 of 4 cycles atezolizumab (TECENTRIQ) 1,200 mg in sodium chloride 0.9 % 250 mL chemo infusion, 1,200 mg, Intravenous, Once, 0 of 8 cycles  for chemotherapy treatment.       Interval history-patient presents to [facility] for complaints of diarrhea.  Patient had lab work collected this morning which revealed a potassium of 2.7.  Diarrhea started approximately 3 days ago.  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 Janese Banks.  She is currently receiving palliative radiation to her lumbar spine.  Plan is for 4 cycles of carbo on day 1, etoposide days 1 through day 3 along with Tecentriq IV day 1 every 3 weeks for 4 cycles followed by maintenance Tecentriq until progression or toxicity beginning on 08/18/2018.  She denies any new medications. she denies any abdominal pain.  Currently having 4-5 loose bowel movements daily.  They are foul-smelling.  Patient states "I wonder if I ate something bad".  Otherwise denies fever or illness, bleeding or bruising, weight loss, chest pain, nausea, vomiting or urinary complaints.  She drinks plenty of water and does not feel dehydrated.   ECOG FS:1 - Symptomatic but completely ambulatory  Review of systems- Review of Systems  Constitutional: Positive for malaise/fatigue. Negative for chills, fever and weight loss.  HENT: Negative for congestion, ear pain and tinnitus.   Eyes: Negative.  Negative for blurred vision and double vision.  Respiratory: Negative.  Negative for cough, sputum production and shortness of breath.   Cardiovascular: Negative.  Negative for chest pain, palpitations and leg swelling.  Gastrointestinal: Positive for diarrhea. Negative for abdominal pain, constipation, nausea and vomiting.  Genitourinary: Negative for dysuria, frequency and urgency.  Musculoskeletal: Negative for back pain and falls.  Skin: Negative.  Negative for rash.  Neurological: Positive for weakness. Negative for headaches.  Endo/Heme/Allergies: Negative.  Does not bruise/bleed easily.  Psychiatric/Behavioral: Positive for depression. The patient is nervous/anxious. The patient  does not have insomnia.      Current treatment-palliative radiation to the spine.  Scheduled to begin carbo/Tecentriq/etoposide on 08/18/2018.  Allergies  Allergen Reactions   Bee Venom Swelling     Past Medical History:    Diagnosis Date   Arthritis    Cirrhosis ([place])    Depression    GERD (gastroesophageal reflux disease)    Hyperlipidemia    Hypertension    Metastatic bone cancer [facility])      Past Surgical History:  Procedure Laterality Date   APPENDECTOMY  1971   CHOLECYSTECTOMY  1971   COLONOSCOPY WITH PROPOFOL N/A 11/15/2016   Procedure: COLONOSCOPY WITH PROPOFOL;  Surgeon: Jonathon Bellows, MD;  Location: [facility] ENDOSCOPY;  Service: Gastroenterology;  Laterality: N/A;   ENDOBRONCHIAL ULTRASOUND Right 07/30/2018   Procedure: ENDOBRONCHIAL ULTRASOUND;  Surgeon: Laverle Hobby, MD;  Location: [facility] ORS;  Service: Pulmonary;  Laterality: Right;   ESOPHAGOGASTRODUODENOSCOPY (EGD) WITH PROPOFOL N/A 01/07/2017   Procedure: ESOPHAGOGASTRODUODENOSCOPY (EGD) WITH PROPOFOL;  Surgeon: Jonathon Bellows, MD;  Location: [facility] ENDOSCOPY;  Service: Gastroenterology;  Laterality: N/A;   LAPAROSCOPY N/A 03/01/2017   Procedure: LAPAROSCOPY DIAGNOSTIC;  Surgeon: Robert Bellow, MD;  Location: [facility] ORS;  Service: General;  Laterality: N/A;   TONSILECTOMY, ADENOIDECTOMY, BILATERAL MYRINGOTOMY AND [place] N/A 03/01/2017   10 x 14 CM Ventralight ST mesh, intraperitoneal location.    VENTRAL HERNIA REPAIR N/A 03/01/2017   Procedure: HERNIA REPAIR VENTRAL ADULT;  Surgeon: Robert Bellow, MD;  Location: [facility] ORS;  Service: General;  Laterality: N/A;    Social History   Socioeconomic History   Marital status: Married    Spouse name: Not on file   Number of children: Not on file   Years of education: Not on file   Highest education level: Not on file  Occupational History   Not on file  Social Needs   Financial resource strain: Not hard at all   Food insecurity    Worry: Never true    Inability: Never true   Transportation needs    Medical: No    Non-medical: No  Tobacco Use   Smoking status: Former Smoker    Packs/day: 2.00    Years: 50.00     Pack years: 100.00    Types: Cigarettes    Quit date: 02/07/2012    Years since quitting: 6.4   Smokeless tobacco: Never Used   Tobacco comment: uses vape without nicotine, one capsule every 3 months.  Substance and Sexual Activity   Alcohol use: No    Alcohol/week: 0.0 standard drinks   Drug use: No   Sexual activity: Yes  Lifestyle   Physical activity    Days per week: Not on file    Minutes per session: Not on file   Stress: Not at all  Relationships   Social connections    Talks on phone: Not on file    Gets together: Not on file    Attends religious service: Not on file    Active member of club or organization: Not on file    Attends meetings of clubs or organizations: Not on file    Relationship status: Not on file   Intimate partner violence    Fear of current or ex partner: No    Emotionally abused: No    Physically abused: No    Forced sexual activity: No  Other Topics Concern   Not on file  Social History  Narrative   Married   Retired   Clinical cytogeneticist level of education    No children    1 cup of coffee    Family History  Problem Relation Age of Onset   Hypertension Mother    Ovarian cancer Mother 54   Heart disease Father    Stroke Father    Ovarian cancer Sister        ? dx cancer had hyst.   Breast cancer Neg Hx      Current Outpatient Medications:    atorvastatin (LIPITOR) 10 MG tablet, Take 10 mg by mouth daily., Disp: , Rfl:    buPROPion (WELLBUTRIN XL) 300 MG 24 hr tablet, Take 1 tablet (300 mg total) by mouth daily., Disp: 90 tablet, Rfl: 1   escitalopram (LEXAPRO) 10 MG tablet, Take 1 tablet (10 mg total) by mouth daily., Disp: 90 tablet, Rfl: 1   ibuprofen (ADVIL,MOTRIN) 200 MG tablet, Take 400 mg by mouth every 8 (eight) hours as needed for headache or moderate pain. , Disp: , Rfl:    lidocaine-prilocaine (EMLA) cream, Apply to affected area once, Disp: 30 g, Rfl: 3   LORazepam (ATIVAN) 0.5 MG tablet, Take 1  tablet (0.5 mg total) by mouth every 6 (six) hours as needed (Nausea or vomiting)., Disp: 30 tablet, Rfl: 0   Multiple Vitamins-Minerals (PRESERVISION AREDS 2) CAPS, Take 1 capsule by mouth 2 (two) times a day. , Disp: , Rfl:    ondansetron (ZOFRAN) 8 MG tablet, Take 1 tablet (8 mg total) by mouth 2 (two) times daily as needed for refractory nausea / vomiting. Start on day 3 after carboplatin chemo., Disp: 30 tablet, Rfl: 1   oxyCODONE (OXY IR/ROXICODONE) 5 MG immediate release tablet, Take 1 tablet (5 mg total) by mouth every 6 (six) hours as needed for severe pain., Disp: 120 tablet, Rfl: 0   pantoprazole (PROTONIX) 40 MG tablet, TAKE 1 TABLET BY MOUTH TWICE DAILY BEFORE A MEAL (Patient taking differently: Take 40 mg by mouth 2 (two) times daily before a meal. ), Disp: 180 tablet, Rfl: 0   potassium chloride SA (K-DUR) 20 MEQ tablet, Take 1 tablet (20 mEq total) by mouth daily., Disp: 10 tablet, Rfl: 0   prochlorperazine (COMPAZINE) 10 MG tablet, Take 1 tablet (10 mg total) by mouth every 6 (six) hours as needed (Nausea or vomiting)., Disp: 30 tablet, Rfl: 1   rosuvastatin (CRESTOR) 20 MG tablet, TAKE 1 TABLET BY MOUTH ONCE DAILY, Disp: 90 tablet, Rfl: 3 No current facility-administered medications for this visit.   Facility-Administered Medications Ordered in Other Visits:    potassium chloride 20 mEq in sodium chloride 0.9 % 510 mL (0.0392 mEq/mL) infusion - Peripheral Line, 20 mEq, Intravenous, Once, Faythe Casa E, NP, Last Rate: 255 mL/hr at 08/06/18 1307, 20 mEq at 08/06/18 1307  Physical exam: There were no vitals filed for this visit. Physical Exam Constitutional:      Appearance: Normal appearance.  HENT:     Head: Normocephalic and atraumatic.  Eyes:     Pupils: Pupils are equal, round, and reactive to light.  Neck:     Musculoskeletal: Normal range of motion.  Cardiovascular:     Rate and Rhythm: Normal rate and regular rhythm.     Heart sounds: Normal heart sounds.  No murmur.  Pulmonary:     Effort: Pulmonary effort is normal.     Breath sounds: Normal breath sounds. No wheezing.  Abdominal:     General: Bowel sounds are normal.  There is no distension.     Palpations: Abdomen is soft.     Tenderness: There is no abdominal tenderness.  Musculoskeletal: Normal range of motion.  Skin:    General: Skin is warm and dry.     Findings: No rash.  Neurological:     Mental Status: She is alert and oriented to person, place, and time.  Psychiatric:        Judgment: Judgment normal.      CMP Latest Ref Rng & Units 08/06/2018  Glucose 70 - 99 mg/dL 104(H)  BUN 8 - 23 mg/dL 12  Creatinine 0.44 - 1.00 mg/dL 0.64  Sodium 135 - 145 mmol/L 141  Potassium 3.5 - 5.1 mmol/L 2.7(LL)  Chloride 98 - 111 mmol/L 102  CO2 22 - 32 mmol/L 30  Calcium 8.9 - 10.3 mg/dL 8.7(L)  Total Protein 6.5 - 8.1 g/dL -  Total Bilirubin 0.3 - 1.2 mg/dL -  Alkaline Phos 38 - 126 U/L -  AST 15 - 41 U/L -  ALT 0 - 44 U/L -   CBC Latest Ref Rng & Units 07/30/2018  WBC 4.0 - 10.5 K/uL 4.2  Hemoglobin 12.0 - 15.0 g/dL 11.3(L)  Hematocrit 36.0 - 46.0 % 37.2  Platelets 150 - 400 K/uL 161    No images are attached to the encounter.  Dg Chest 1 View  Result Date: 07/30/2018 CLINICAL DATA:  Metastatic cancer. Status post bronchoscopy with biopsies. EXAM: CHEST  1 VIEW COMPARISON:  PET-CT dated 07/15/2018 FINDINGS: There is no evidence of pneumothorax or pulmonary hemorrhage or other acute abnormality. No infiltrates or effusions. Mediastinal adenopathy as noted on the prior PET scan. The pulmonary nodule in the right upper lobe posterior medially is obscured by the mediastinal enlargement. No discrete metastatic disease to the skeleton. Old deformity of the posterior aspect of the left seventh rib consistent with an old fracture present on the prior PET-CT. IMPRESSION: No pneumothorax or pulmonary hemorrhage after bronchoscopy with biopsies. Mediastinal adenopathy. Electronically Signed    By: Lorriane Shire M.D.   On: 07/30/2018 15:16   Mr Jeri Cos [place] Contrast  Result Date: 07/29/2018 CLINICAL DATA:  Abnormal PET, lung mass. Presumed lung cancer. Staging EXAM: MRI HEAD WITHOUT AND WITH CONTRAST TECHNIQUE: Multiplanar, multiecho pulse sequences of the brain and surrounding structures were obtained without and with intravenous contrast. CONTRAST:  7.5 mL Gadovist IV COMPARISON:  MRI head 03/20/2017 FINDINGS: Brain: Ventricle size and cerebral volume normal. Negative for acute infarct. Scattered small white matter hyperintensities most consistent with chronic microvascular ischemia. Negative for hemorrhage mass or edema Normal enhancement.  No enhancing metastatic deposits identified. Vascular: Normal arterial flow voids. Skull and upper cervical spine: No worrisome skull lesions identified. Sinuses/Orbits: Paranasal sinuses clear.  Bilateral cataract surgery Other: None IMPRESSION: No acute abnormality and  negative for metastatic disease. Mild chronic microvascular ischemia in the white matter. Electronically Signed   By: Franchot Gallo M.D.   On: 07/29/2018 11:13   Nm Pet Image Initial (pi) Skull Base To Thigh  Result Date: 07/15/2018 CLINICAL DATA:  Initial treatment strategy for bone metastasis on lumbar spine MRI. EXAM: NUCLEAR MEDICINE PET SKULL BASE TO THIGH TECHNIQUE: 9.9 mCi F-18 FDG was injected intravenously. Full-ring PET imaging was performed from the skull base to thigh after the radiotracer. CT data was obtained and used for attenuation correction and anatomic localization. Fasting blood glucose: 85 mg/dl COMPARISON:  Lumbar spine MRI of 07/04/2018. 01/02/2017 chest abdomen and pelvic CTs. FINDINGS: Mediastinal blood pool  activity: SUV max 2.6 Liver activity: SUV max NA NECK: No areas of abnormal hypermetabolism. Incidental CT findings: No cervical adenopathy. CHEST: Prevascular nodal mass at 2.7 cm and a S.U.V. max of 12.5 on image 76/3. Right paratracheal nodal mass measures 3.6  cm and a S.U.V. max of 13.1 on image 82/3. Two adjacent versus 1 bilobed posterior right upper lobe pulmonary nodule/nodules. Example posteriorly at 1.5 cm and a S.U.V. max of 10.1 on image 79/3. Anteriorly and inferiorly at 1.5 cm and a S.U.V. max of 9.5 on image 83/3. Right hilar nodal hypermetabolism at a S.U.V. max of 11.7. Incidental CT findings: Aortic and multivessel coronary artery atherosclerosis. Pulmonary artery enlargement, including a 2.9 cm right pulmonary artery. Mild centrilobular emphysema. Superior segment left lower lobe pulmonary nodule of 5 mm on image 92/3, present on 01/02/2017. ABDOMEN/PELVIS: No abdominopelvic parenchymal or nodal hypermetabolism. Probable postsurgical hypermetabolism about the anterior abdominal wall, including at a S.U.V. max of 5.8 on image 167/3. Incidental CT findings: Moderate cirrhosis. Cholecystectomy. 6 mm complex lower pole right renal lesion is of doubtful clinical significance. Abdominal aortic atherosclerosis with nonaneurysmal dilatation of the infrarenal segment at 2.2 cm. Large colonic stool burden. Pelvic floor laxity. SKELETON: Multifocal osseous metastasis. L2 hypermetabolic metastasis with pathologic fracture. This measures a S.U.V. max of 12.3. An index right iliac lytic lesion measures 2.7 cm and a S.U.V. max of 14.1 on 205/3. Incidental CT findings: Healing posterolateral left seventh rib fracture. IMPRESSION: 1. Right upper lobe pulmonary nodule or nodules with hypermetabolic thoracic adenopathy. 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 2. Widespread osseous metastasis, as on prior MRI. 3. Aortic atherosclerosis (ICD10-I70.0), coronary artery atherosclerosis and emphysema (ICD10-J43.9). 4. Pulmonary artery enlargement suggests pulmonary arterial hypertension. 5. Cirrhosis. Electronically Signed   By: Abigail Miyamoto M.D.   On: 07/15/2018 16:47     Assessment and plan- Patient is a 68 y.o. female who presents to [facility] for diarrhea  resulting in hypokalemia.  Extensive small cell lung cancer with bone mets: Currently receiving palliative radiation to the spine.  Scheduled to start chemotherapy with carbo/etoposide/Tecentriq on 08/18/2018.   Hypokalemia: Due to diarrhea.  Likely related to radiation to spine.  She has not started chemo and or had a change in  medications.  Denies any suspicious foods/drinks that could have been contributory. No abdominal pain, nausea or vomiting.  Plan: 20 mEq potassium IV in clinic today Rx 20 mEq  potassium tablets daily X 10 days. Collect a stool sample in clinic to r/o infection.  Patient can start Imodium after we receive stool results.  Disposition: Return to clinic daily for radiation. Return to clinic on 08/07/2018 for chemo education and chemo care. Return to clinic on 08/18/2018 for md assessment and chemotherapy start.   Visit Diagnosis 1. Diarrhea, unspecified type     Patient expressed understanding and was in agreement with this plan. She also understands that She can call clinic at any time with any questions, concerns, or complaints.   Greater than 50% was spent in counseling and coordination of care with this patient including but not limited to discussion of the relevant topics above (See A&P) including, but not limited to diagnosis and management of acute and chronic medical conditions.   Thank you for allowing me to participate in the care of this very pleasant patient.   Jacquelin Hawking, NP [place] [facility] Cell - [phone] Pager- [phone] 08/06/2018 2:42 PM

## 2018-08-06 NOTE — Telephone Encounter (Signed)
ERROR

## 2018-08-07 ENCOUNTER — Inpatient Hospital Stay: Payer: PPO

## 2018-08-07 ENCOUNTER — Ambulatory Visit
Admission: RE | Admit: 2018-08-07 | Discharge: 2018-08-07 | Disposition: A | Discharge status: Home / Self Care | Payer: PPO | Source: Ambulatory Visit | Attending: Radiation Oncology | Admitting: Radiation Oncology

## 2018-08-07 ENCOUNTER — Other Ambulatory Visit: Payer: Self-pay

## 2018-08-07 ENCOUNTER — Inpatient Hospital Stay: Payer: PPO | Admitting: Oncology

## 2018-08-07 DIAGNOSIS — Z510 Encounter for antineoplastic radiation therapy: Secondary | ICD-10-CM | POA: Diagnosis not present

## 2018-08-07 NOTE — Progress Notes (Deleted)
[place]  [phone] [phone]  Patient Care Team: Leone Haven, MD as PCP - General (Family Medicine) Leone Haven, MD as Consulting Physician (Family Medicine) Bary Castilla, Forest Gleason, MD (General Surgery) Telford Nab, RN as Registered Nurse   Name of the patient: Anne [name]  [id number]  [birth year]   Date of visit: 08/07/18  Diagnosis- ***  Chief complaint/Reason for visit- Initial Meeting for [facility], preparing for starting chemotherapy  Heme/Onc history:  Oncology History  Small cell lung cancer, right upper lobe ([place])  08/05/2018 Cancer Staging   Staging form: Lung, AJCC 8th Edition - Clinical stage from 08/05/2018: Stage IVB (cT3, cN2, cM1c) - Signed by Sindy Guadeloupe, MD on 08/06/2018   08/06/2018 Initial Diagnosis   Small cell lung cancer, right upper lobe ([place])   08/18/2018 -  Chemotherapy   The patient had palonosetron (ALOXI) injection 0.25 mg, 0.25 mg, Intravenous,  Once, 0 of 4 cycles pegfilgrastim (NEULASTA ONPRO KIT) injection 6 mg, 6 mg, Subcutaneous, Once, 0 of 4 cycles CARBOplatin (PARAPLATIN) 460 mg in sodium chloride 0.9 % 250 mL chemo infusion, 460 mg (original dose ), Intravenous,  Once, 0 of 4 cycles Dose modification:   (Cycle 1) etoposide (VEPESID) 190 mg in sodium chloride 0.9 % 500 mL chemo infusion, 100 mg/m2, Intravenous,  Once, 0 of 4 cycles fosaprepitant (EMEND) 150 mg, dexamethasone (DECADRON) 12 mg in sodium chloride 0.9 % 145 mL IVPB, , Intravenous,  Once, 0 of 4 cycles atezolizumab (TECENTRIQ) 1,200 mg in sodium chloride 0.9 % 250 mL chemo infusion, 1,200 mg, Intravenous, Once, 0 of 8 cycles  for chemotherapy treatment.      Interval history-  *** who presents to chemo care clinic today for initial meeting in preparation for starting chemotherapy. I introduced the chemo care clinic and we discussed that the role of the clinic is to assist those who  are at an increased risk of emergency room visits and/or complications during the course of chemotherapy treatment. We discussed that the increased risk takes into account factors such as age, performance status, and co-morbidities. We also discussed that for some, this might include barriers to care such as not having a primary care provider, lack of insurance/transportation, or not being able to afford medications. We discussed that the goal of the program is to help prevent unplanned ER visits and help reduce complications during chemotherapy. We do this by discussing specific risk factors to each individual and identifying ways that we can help improve these risk factors and reduce barriers to care.   ECOG FS:{CHL ONC [id number]}  Review of systems- ROS   Current treatment- ***  Allergies  Allergen Reactions  . Bee Venom Swelling    Past Medical History:  Diagnosis Date  . Arthritis   . Cirrhosis ([place])   . Depression   . GERD (gastroesophageal reflux disease)   . Hyperlipidemia   . Hypertension   . Metastatic bone cancer [facility])     Past Surgical History:  Procedure Laterality Date  . APPENDECTOMY  1971  . CHOLECYSTECTOMY  1971  . COLONOSCOPY WITH PROPOFOL N/A 11/15/2016   Procedure: COLONOSCOPY WITH PROPOFOL;  Surgeon: Jonathon Bellows, MD;  Location: [facility] ENDOSCOPY;  Service: Gastroenterology;  Laterality: N/A;  . ENDOBRONCHIAL ULTRASOUND Right 07/30/2018   Procedure: ENDOBRONCHIAL ULTRASOUND;  Surgeon: Laverle Hobby, MD;  Location: [facility] ORS;  Service: Pulmonary;  Laterality: Right;  . ESOPHAGOGASTRODUODENOSCOPY (EGD) WITH PROPOFOL N/A 01/07/2017  Procedure: ESOPHAGOGASTRODUODENOSCOPY (EGD) WITH PROPOFOL;  Surgeon: Jonathon Bellows, MD;  Location: [facility] ENDOSCOPY;  Service: Gastroenterology;  Laterality: N/A;  . LAPAROSCOPY N/A 03/01/2017   Procedure: LAPAROSCOPY DIAGNOSTIC;  Surgeon: Robert Bellow, MD;  Location: [facility] ORS;  Service: General;  Laterality: N/A;  .  TONSILECTOMY, ADENOIDECTOMY, BILATERAL MYRINGOTOMY [place]  . TONSILLECTOMY    . VENTRAL HERNIA REPAIR N/A 03/01/2017   10 x 14 CM Ventralight ST mesh, intraperitoneal location.   . VENTRAL HERNIA REPAIR N/A 03/01/2017   Procedure: HERNIA REPAIR VENTRAL ADULT;  Surgeon: Robert Bellow, MD;  Location: [facility] ORS;  Service: General;  Laterality: N/A;    Social History   Socioeconomic History  . Marital status: Married    Spouse name: Not on file  . Number of children: Not on file  . Years of education: Not on file  . Highest education level: Not on file  Occupational History  . Not on file  Social Needs  . Financial resource strain: Not hard at all  . Food insecurity    Worry: Never true    Inability: Never true  . Transportation needs    Medical: No    Non-medical: No  Tobacco Use  . Smoking status: Former Smoker    Packs/day: 2.00    Years: 50.00    Pack years: 100.00    Types: Cigarettes    Quit date: 02/07/2012    Years since quitting: 6.5  . Smokeless tobacco: Never Used  . Tobacco comment: uses vape without nicotine, one capsule every 3 months.  Substance and Sexual Activity  . Alcohol use: No    Alcohol/week: 0.0 standard drinks  . Drug use: No  . Sexual activity: Yes  Lifestyle  . Physical activity    Days per week: Not on file    Minutes per session: Not on file  . Stress: Not at all  Relationships  . Social Herbalist on phone: Not on file    Gets together: Not on file    Attends religious service: Not on file    Active member of club or organization: Not on file    Attends meetings of clubs or organizations: Not on file    Relationship status: Not on file  . Intimate partner violence    Fear of current or ex partner: No    Emotionally abused: No    Physically abused: No    Forced sexual activity: No  Other Topics Concern  . Not on file  Social History Narrative   Married   Retired   Clinical cytogeneticist level of education    No  children    1 cup of coffee    Family History  Problem Relation Age of Onset  . Hypertension Mother   . Ovarian cancer Mother 48  . Heart disease Father   . Stroke Father   . Ovarian cancer Sister        ? dx cancer had hyst.  . Breast cancer Neg Hx      Current Outpatient Medications:  .  atorvastatin (LIPITOR) 10 MG tablet, Take 10 mg by mouth daily., Disp: , Rfl:  .  buPROPion (WELLBUTRIN XL) 300 MG 24 hr tablet, Take 1 tablet (300 mg total) by mouth daily., Disp: 90 tablet, Rfl: 1 .  escitalopram (LEXAPRO) 10 MG tablet, Take 1 tablet (10 mg total) by mouth daily., Disp: 90 tablet, Rfl: 1 .  ibuprofen (ADVIL,MOTRIN) 200 MG tablet, Take 400 mg  by mouth every 8 (eight) hours as needed for headache or moderate pain. , Disp: , Rfl:  .  lidocaine-prilocaine (EMLA) cream, Apply to affected area once, Disp: 30 g, Rfl: 3 .  LORazepam (ATIVAN) 0.5 MG tablet, Take 1 tablet (0.5 mg total) by mouth every 6 (six) hours as needed (Nausea or vomiting)., Disp: 30 tablet, Rfl: 0 .  Multiple Vitamins-Minerals (PRESERVISION AREDS 2) CAPS, Take 1 capsule by mouth 2 (two) times a day. , Disp: , Rfl:  .  ondansetron (ZOFRAN) 8 MG tablet, Take 1 tablet (8 mg total) by mouth 2 (two) times daily as needed for refractory nausea / vomiting. Start on day 3 after carboplatin chemo., Disp: 30 tablet, Rfl: 1 .  oxyCODONE (OXY IR/ROXICODONE) 5 MG immediate release tablet, Take 1 tablet (5 mg total) by mouth every 6 (six) hours as needed for severe pain., Disp: 120 tablet, Rfl: 0 .  pantoprazole (PROTONIX) 40 MG tablet, TAKE 1 TABLET BY MOUTH TWICE DAILY BEFORE A MEAL (Patient taking differently: Take 40 mg by mouth 2 (two) times daily before a meal. ), Disp: 180 tablet, Rfl: 0 .  potassium chloride SA (K-DUR) 20 MEQ tablet, Take 1 tablet (20 mEq total) by mouth daily., Disp: 10 tablet, Rfl: 0 .  prochlorperazine (COMPAZINE) 10 MG tablet, Take 1 tablet (10 mg total) by mouth every 6 (six) hours as needed (Nausea or  vomiting)., Disp: 30 tablet, Rfl: 1 .  rosuvastatin (CRESTOR) 20 MG tablet, TAKE 1 TABLET BY MOUTH ONCE DAILY, Disp: 90 tablet, Rfl: 3  Physical exam: There were no vitals filed for this visit. Physical Exam   CMP Latest Ref Rng & Units 08/06/2018  Glucose 70 - 99 mg/dL 104(H)  BUN 8 - 23 mg/dL 12  Creatinine 0.44 - 1.00 mg/dL 0.64  Sodium 135 - 145 mmol/L 141  Potassium 3.5 - 5.1 mmol/L 2.7(LL)  Chloride 98 - 111 mmol/L 102  CO2 22 - 32 mmol/L 30  Calcium 8.9 - 10.3 mg/dL 8.7(L)  Total Protein 6.5 - 8.1 g/dL -  Total Bilirubin 0.3 - 1.2 mg/dL -  Alkaline Phos 38 - 126 U/L -  AST 15 - 41 U/L -  ALT 0 - 44 U/L -   CBC Latest Ref Rng & Units 07/30/2018  WBC 4.0 - 10.5 K/uL 4.2  Hemoglobin 12.0 - 15.0 g/dL 11.3(L)  Hematocrit 36.0 - 46.0 % 37.2  Platelets 150 - 400 K/uL 161    No images are attached to the encounter.  Dg Chest 1 View  Result Date: 07/30/2018 CLINICAL DATA:  Metastatic cancer. Status post bronchoscopy with biopsies. EXAM: CHEST  1 VIEW COMPARISON:  PET-CT dated 07/15/2018 FINDINGS: There is no evidence of pneumothorax or pulmonary hemorrhage or other acute abnormality. No infiltrates or effusions. Mediastinal adenopathy as noted on the prior PET scan. The pulmonary nodule in the right upper lobe posterior medially is obscured by the mediastinal enlargement. No discrete metastatic disease to the skeleton. Old deformity of the posterior aspect of the left seventh rib consistent with an old fracture present on the prior PET-CT. IMPRESSION: No pneumothorax or pulmonary hemorrhage after bronchoscopy with biopsies. Mediastinal adenopathy. Electronically Signed   By: Lorriane Shire M.D.   On: 07/30/2018 15:16   Mr Jeri Cos PT Contrast  Result Date: 07/29/2018 CLINICAL DATA:  Abnormal PET, lung mass. Presumed lung cancer. Staging EXAM: MRI HEAD WITHOUT AND WITH CONTRAST TECHNIQUE: Multiplanar, multiecho pulse sequences of the brain and surrounding structures were obtained without  and with  intravenous contrast. CONTRAST:  7.5 mL Gadovist IV COMPARISON:  MRI head 03/20/2017 FINDINGS: Brain: Ventricle size and cerebral volume normal. Negative for acute infarct. Scattered small white matter hyperintensities most consistent with chronic microvascular ischemia. Negative for hemorrhage mass or edema Normal enhancement.  No enhancing metastatic deposits identified. Vascular: Normal arterial flow voids. Skull and upper cervical spine: No worrisome skull lesions identified. Sinuses/Orbits: Paranasal sinuses clear.  Bilateral cataract surgery Other: None IMPRESSION: No acute abnormality and  negative for metastatic disease. Mild chronic microvascular ischemia in the white matter. Electronically Signed   By: Franchot Gallo M.D.   On: 07/29/2018 11:13   Nm Pet Image Initial (pi) Skull Base To Thigh  Result Date: 07/15/2018 CLINICAL DATA:  Initial treatment strategy for bone metastasis on lumbar spine MRI. EXAM: NUCLEAR MEDICINE PET SKULL BASE TO THIGH TECHNIQUE: 9.9 mCi F-18 FDG was injected intravenously. Full-ring PET imaging was performed from the skull base to thigh after the radiotracer. CT data was obtained and used for attenuation correction and anatomic localization. Fasting blood glucose: 85 mg/dl COMPARISON:  Lumbar spine MRI of 07/04/2018. 01/02/2017 chest abdomen and pelvic CTs. FINDINGS: Mediastinal blood pool activity: SUV max 2.6 Liver activity: SUV max NA NECK: No areas of abnormal hypermetabolism. Incidental CT findings: No cervical adenopathy. CHEST: Prevascular nodal mass at 2.7 cm and a S.U.V. max of 12.5 on image 76/3. Right paratracheal nodal mass measures 3.6 cm and a S.U.V. max of 13.1 on image 82/3. Two adjacent versus 1 bilobed posterior right upper lobe pulmonary nodule/nodules. Example posteriorly at 1.5 cm and a S.U.V. max of 10.1 on image 79/3. Anteriorly and inferiorly at 1.5 cm and a S.U.V. max of 9.5 on image 83/3. Right hilar nodal hypermetabolism at a S.U.V. max of  11.7. Incidental CT findings: Aortic and multivessel coronary artery atherosclerosis. Pulmonary artery enlargement, including a 2.9 cm right pulmonary artery. Mild centrilobular emphysema. Superior segment left lower lobe pulmonary nodule of 5 mm on image 92/3, present on 01/02/2017. ABDOMEN/PELVIS: No abdominopelvic parenchymal or nodal hypermetabolism. Probable postsurgical hypermetabolism about the anterior abdominal wall, including at a S.U.V. max of 5.8 on image 167/3. Incidental CT findings: Moderate cirrhosis. Cholecystectomy. 6 mm complex lower pole right renal lesion is of doubtful clinical significance. Abdominal aortic atherosclerosis with nonaneurysmal dilatation of the infrarenal segment at 2.2 cm. Large colonic stool burden. Pelvic floor laxity. SKELETON: Multifocal osseous metastasis. L2 hypermetabolic metastasis with pathologic fracture. This measures a S.U.V. max of 12.3. An index right iliac lytic lesion measures 2.7 cm and a S.U.V. max of 14.1 on 205/3. Incidental CT findings: Healing posterolateral left seventh rib fracture. IMPRESSION: 1. Right upper lobe pulmonary nodule or nodules with hypermetabolic thoracic adenopathy. 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 2. Widespread osseous metastasis, as on prior MRI. 3. Aortic atherosclerosis (ICD10-I70.0), coronary artery atherosclerosis and emphysema (ICD10-J43.9). 4. Pulmonary artery enlargement suggests pulmonary arterial hypertension. 5. Cirrhosis. Electronically Signed   By: Abigail Miyamoto M.D.   On: 07/15/2018 16:47     Assessment and plan- Patient is a 68 y.o. female who presents to [facility] for initial meeting in preparation for starting chemotherapy for the treatment of ***.    1. Cancer-   2. Chemo Care Clinic/High Risk for ER/Hospitalization during chemotherapy- We discussed the role of the chemo care clinic and identified patient specific risk factors. I discussed that patient was identified as high risk  primarily based on: ***  Current PCP-  Hospital Admissions-  ED Visits-  Medicaid:  Medicare:  In relationship:  Has anemia: Has asthma:  Has atrial fibrillation Has cvd: Has ckd:  Has copd: Has chf: Has connective tissue disorder: Has depression:  Has diabetes:  Has liver disease:  Has PVD:   3. Social Determinants of Health- we discussed that social determinants of health may have significant impacts on health and outcomes for cancer patients.  Today we discussed specific social determinants of performance status, alcohol use, depression, financial needs, food insecurity, housing, interpersonal violence, social connections, stress, tobacco use, and transportation.  After lengthy discussion the following were identified as areas of need:   Based on performance status/activity level we discussed options including home based and outpatient services, DME, and CARE program. We discusssed that patients who participate in regular physical activity report fewer negative impacts of cancer and treatments and report less fatigue.   Based on depression: we discussed self-referral to [facility] for counseling services, psychiatry for medication management, or palliative care/symptom management as well as primary care providers.   Based on financial insecurity: We discussed that living with cancer can create tremendous financial burden.  We discussed options for assistance. I asked that if assistance is needed in affording medications or paying bills to please let us know so that we can provide assistance.   Based on food insecurity-we discussed options for food including social services.  We will also notify [facility] to see if cancer center can provide support.  Patient informed of food pantry at cancer center and was provided with care package today.  Please notify nursing if un-met needs.  Interpersonal Violence-   Based on alcohol use-   Based on housing insecurity: we discussed  referral to social work. Will also discuss with Elease Etienne to see if [facility] can provide support of utility bills, etc.   Based on lack of social connections-we discussed options for support groups at the cancer center. If interested, please notify nurse navigator to enroll.   Based on concern of stress-we discussed options for managing stress including healthy eating, exercise as well as participating in no charge counseling services at the cancer center and support groups.  If these are of interest, patient can notify either myself or primary nursing team.  Based on tobacco use-smoking cessation was encouraged.  We discussed options for management including medications and referral to quit Smart program  Based on transportation need: we discussed options for transportation including [employer], paratransit, bus routes, [employer] transit, taxi/uber/lyft, and cancer center van.  I have notified primary oncology team who will help assist with arranging Lucianne Lei transportation for appointments when needed. We also discussed options for transportation on short notice/acute visits.   4. Co-morbidities Complicating Care:   5. Palliative Care- based on stage of cancer and/or identified needs today, I will refer patient to palliative care for goals of care and advanced care planning.  We also discussed the role of the Symptom Management Clinic at [facility] for acute issues and methods of contacting clinic/provider. She denies needing specific assistance at this time and She will be followed by ***, RN (Nurse Navigator).    Visit Diagnosis No diagnosis found.   Patient expressed understanding and was in agreement with this plan. She also understands that She can call clinic at any time with any questions, concerns, or complaints.   A total of (25) minutes of face-to-face time was spent with this patient with greater than 50% of that time in counseling and care-coordination.  Beckey Rutter, DNP, AGNP-C Cancer  Center at [facility] [phone] (work cell) [phone] (office)  CC:

## 2018-08-10 ENCOUNTER — Other Ambulatory Visit (INDEPENDENT_AMBULATORY_CARE_PROVIDER_SITE_OTHER): Payer: Self-pay | Admitting: Nurse Practitioner

## 2018-08-11 ENCOUNTER — Ambulatory Visit
Admission: RE | Admit: 2018-08-11 | Discharge: 2018-08-11 | Disposition: A | Discharge status: Home / Self Care | Payer: PPO | Source: Ambulatory Visit | Attending: Radiation Oncology | Admitting: Radiation Oncology

## 2018-08-11 ENCOUNTER — Other Ambulatory Visit
Admission: RE | Admit: 2018-08-11 | Discharge: 2018-08-11 | Disposition: A | Discharge status: Home / Self Care | Payer: PPO | Source: Ambulatory Visit | Attending: Vascular Surgery | Admitting: Vascular Surgery

## 2018-08-11 ENCOUNTER — Other Ambulatory Visit: Payer: Self-pay

## 2018-08-11 DIAGNOSIS — Z510 Encounter for antineoplastic radiation therapy: Secondary | ICD-10-CM | POA: Diagnosis not present

## 2018-08-11 DIAGNOSIS — Z1159 Encounter for screening for other viral diseases: Secondary | ICD-10-CM | POA: Diagnosis not present

## 2018-08-11 DIAGNOSIS — Z01812 Encounter for preprocedural laboratory examination: Secondary | ICD-10-CM | POA: Diagnosis not present

## 2018-08-11 LAB — SARS CORONAVIRUS 2 (TAT 6-24 HRS): SARS Coronavirus 2: NEGATIVE

## 2018-08-11 NOTE — Progress Notes (Unsigned)
PSN spoke with patient today about financial concerns.  Patient not sure she wants to pursue any assistance at this time, but will call PSN if needs arise.

## 2018-08-12 ENCOUNTER — Ambulatory Visit
Admission: RE | Admit: 2018-08-12 | Discharge: 2018-08-12 | Disposition: A | Discharge status: Home / Self Care | Payer: PPO | Source: Ambulatory Visit | Attending: Radiation Oncology | Admitting: Radiation Oncology

## 2018-08-12 ENCOUNTER — Other Ambulatory Visit: Payer: Self-pay

## 2018-08-12 ENCOUNTER — Other Ambulatory Visit: Payer: PPO

## 2018-08-12 DIAGNOSIS — Z510 Encounter for antineoplastic radiation therapy: Secondary | ICD-10-CM | POA: Diagnosis not present

## 2018-08-13 ENCOUNTER — Ambulatory Visit
Admission: RE | Admit: 2018-08-13 | Discharge: 2018-08-13 | Disposition: A | Discharge status: Home / Self Care | Payer: PPO | Source: Ambulatory Visit | Attending: Radiation Oncology | Admitting: Radiation Oncology

## 2018-08-13 ENCOUNTER — Ambulatory Visit: Payer: PPO

## 2018-08-13 ENCOUNTER — Other Ambulatory Visit: Payer: Self-pay

## 2018-08-13 DIAGNOSIS — Z87891 Personal history of nicotine dependence: Secondary | ICD-10-CM | POA: Diagnosis not present

## 2018-08-13 DIAGNOSIS — Z510 Encounter for antineoplastic radiation therapy: Secondary | ICD-10-CM | POA: Diagnosis not present

## 2018-08-13 DIAGNOSIS — C7951 Secondary malignant neoplasm of bone: Secondary | ICD-10-CM | POA: Diagnosis not present

## 2018-08-13 DIAGNOSIS — C801 Malignant (primary) neoplasm, unspecified: Secondary | ICD-10-CM | POA: Diagnosis not present

## 2018-08-13 MED ORDER — CEFAZOLIN SODIUM-DEXTROSE 2-4 GM/100ML-% IV SOLN
2.0000 g | Freq: Once | INTRAVENOUS | Status: AC
  Administered 2018-08-14: 2 g via INTRAVENOUS

## 2018-08-14 ENCOUNTER — Ambulatory Visit: Payer: PPO

## 2018-08-14 ENCOUNTER — Encounter
Admission: RE | Disposition: A | Discharge status: Home / Self Care | Payer: Self-pay | Source: Home / Self Care | Attending: Vascular Surgery

## 2018-08-14 ENCOUNTER — Ambulatory Visit
Admission: RE | Admit: 2018-08-14 | Discharge: 2018-08-14 | Disposition: A | Discharge status: Home / Self Care | Payer: PPO | Attending: Vascular Surgery | Admitting: Vascular Surgery

## 2018-08-14 ENCOUNTER — Other Ambulatory Visit: Payer: Self-pay

## 2018-08-14 DIAGNOSIS — C7951 Secondary malignant neoplasm of bone: Secondary | ICD-10-CM | POA: Insufficient documentation

## 2018-08-14 DIAGNOSIS — I10 Essential (primary) hypertension: Secondary | ICD-10-CM | POA: Diagnosis not present

## 2018-08-14 DIAGNOSIS — E785 Hyperlipidemia, unspecified: Secondary | ICD-10-CM | POA: Diagnosis not present

## 2018-08-14 DIAGNOSIS — Z823 Family history of stroke: Secondary | ICD-10-CM | POA: Diagnosis not present

## 2018-08-14 DIAGNOSIS — Z8249 Family history of ischemic heart disease and other diseases of the circulatory system: Secondary | ICD-10-CM | POA: Insufficient documentation

## 2018-08-14 DIAGNOSIS — Z79899 Other long term (current) drug therapy: Secondary | ICD-10-CM | POA: Diagnosis not present

## 2018-08-14 DIAGNOSIS — K7460 Unspecified cirrhosis of liver: Secondary | ICD-10-CM | POA: Insufficient documentation

## 2018-08-14 DIAGNOSIS — Z87891 Personal history of nicotine dependence: Secondary | ICD-10-CM | POA: Insufficient documentation

## 2018-08-14 DIAGNOSIS — K219 Gastro-esophageal reflux disease without esophagitis: Secondary | ICD-10-CM | POA: Insufficient documentation

## 2018-08-14 DIAGNOSIS — Z8041 Family history of malignant neoplasm of ovary: Secondary | ICD-10-CM | POA: Diagnosis not present

## 2018-08-14 DIAGNOSIS — C3411 Malignant neoplasm of upper lobe, right bronchus or lung: Secondary | ICD-10-CM | POA: Diagnosis not present

## 2018-08-14 DIAGNOSIS — M1990 Unspecified osteoarthritis, unspecified site: Secondary | ICD-10-CM | POA: Diagnosis not present

## 2018-08-14 DIAGNOSIS — C3490 Malignant neoplasm of unspecified part of unspecified bronchus or lung: Secondary | ICD-10-CM

## 2018-08-14 HISTORY — PX: PORTA CATH INSERTION: CATH118285

## 2018-08-14 SURGERY — PORTA CATH INSERTION
Anesthesia: Moderate Sedation

## 2018-08-14 MED ORDER — FAMOTIDINE 20 MG PO TABS: 40.0000 mg | ORAL_TABLET | Freq: Once | ORAL | Status: DC | PRN

## 2018-08-14 MED ORDER — MIDAZOLAM HCL 2 MG/2ML IJ SOLN
INTRAMUSCULAR | Status: DC | PRN
  Administered 2018-08-14: 2 mg via INTRAVENOUS

## 2018-08-14 MED ORDER — SODIUM CHLORIDE 0.9 % IV SOLN
Freq: Once | INTRAVENOUS | Status: AC
  Administered 2018-08-14: 09:00:00
  Filled 2018-08-14: qty 80

## 2018-08-14 MED ORDER — ONDANSETRON HCL 4 MG/2ML IJ SOLN: 4.0000 mg | Freq: Four times a day (QID) | INTRAMUSCULAR | Status: DC | PRN

## 2018-08-14 MED ORDER — MIDAZOLAM HCL 5 MG/5ML IJ SOLN
INTRAMUSCULAR | Status: AC
  Filled 2018-08-14: qty 5

## 2018-08-14 MED ORDER — FENTANYL CITRATE (PF) 100 MCG/2ML IJ SOLN
INTRAMUSCULAR | Status: AC
  Filled 2018-08-14: qty 2

## 2018-08-14 MED ORDER — FENTANYL CITRATE (PF) 100 MCG/2ML IJ SOLN
INTRAMUSCULAR | Status: DC | PRN
  Administered 2018-08-14: 50 ug via INTRAVENOUS

## 2018-08-14 MED ORDER — MIDAZOLAM HCL 2 MG/ML PO SYRP: 8.0000 mg | ORAL_SOLUTION | Freq: Once | ORAL | Status: DC | PRN

## 2018-08-14 MED ORDER — HYDROMORPHONE HCL 1 MG/ML IJ SOLN: 1.0000 mg | Freq: Once | INTRAMUSCULAR | Status: DC | PRN

## 2018-08-14 MED ORDER — SODIUM CHLORIDE 0.9 % IV SOLN
INTRAVENOUS | Status: DC
  Administered 2018-08-14: 07:00:00 via INTRAVENOUS

## 2018-08-14 MED ORDER — METHYLPREDNISOLONE SODIUM SUCC 125 MG IJ SOLR: 125.0000 mg | Freq: Once | INTRAMUSCULAR | Status: DC | PRN

## 2018-08-14 MED ORDER — DIPHENHYDRAMINE HCL 50 MG/ML IJ SOLN: 50.0000 mg | Freq: Once | INTRAMUSCULAR | Status: DC | PRN

## 2018-08-14 SURGICAL SUPPLY — 15 items
ADH SKN CLS APL DERMABOND .7 (GAUZE/BANDAGES/DRESSINGS) ×1
CANNULA 5F STIFF (CANNULA) ×2 IMPLANT
COVER PROBE U/S 5X48 (MISCELLANEOUS) ×2 IMPLANT
DERMABOND ADVANCED (GAUZE/BANDAGES/DRESSINGS) ×2
DERMABOND ADVANCED .7 DNX12 (GAUZE/BANDAGES/DRESSINGS) IMPLANT
KIT PORT POWER 8FR ISP CVUE (Port) ×2 IMPLANT
PACK ANGIOGRAPHY (CUSTOM PROCEDURE TRAY) ×3 IMPLANT
PAD GROUND ADULT SPLIT (MISCELLANEOUS) ×2 IMPLANT
PENCIL ELECTRO HAND CTR (MISCELLANEOUS) ×3 IMPLANT
SPONGE XRAY 4X4 16PLY STRL (MISCELLANEOUS) ×2 IMPLANT
SUT MNCRL AB 4-0 PS2 18 (SUTURE) ×3 IMPLANT
SUT PROLENE 0 CT 1 30 (SUTURE) ×3 IMPLANT
SUT VIC AB 3-0 SH 27 (SUTURE) ×3
SUT VIC AB 3-0 SH 27X BRD (SUTURE) IMPLANT
WIRE NITINOL .018 (WIRE) ×2 IMPLANT

## 2018-08-14 NOTE — Progress Notes (Signed)
Patient clinically stable post port placement per Dr Lucky Cowboy. Denies complaints. Alert and oriented post procedure. Denies complaints. Vitals stable. Dr Lucky Cowboy out to speak with patient with questions answered. Discharge instructions given to Anne [name]/husband and patient over phone with questions answered.

## 2018-08-14 NOTE — Discharge Instructions (Signed)
Moderate Conscious Sedation, Adult, Care After °These instructions provide you with information about caring for yourself after your procedure. Your health care provider may also give you more specific instructions. Your treatment has been planned according to current medical practices, but problems sometimes occur. Call your health care provider if you have any problems or questions after your procedure. °What can I expect after the procedure? °After your procedure, it is common: °· To feel sleepy for several hours. °· To feel clumsy and have poor balance for several hours. °· To have poor judgment for several hours. °· To vomit if you eat too soon. °Follow these instructions at home: °For at least 24 hours after the procedure: ° °· Do not: °? Participate in activities where you could fall or become injured. °? Drive. °? Use heavy machinery. °? Drink alcohol. °? Take sleeping pills or medicines that cause drowsiness. °? Make important decisions or sign legal documents. °? Take care of children on your own. °· Rest. °Eating and drinking °· Follow the diet recommended by your health care provider. °· If you vomit: °? Drink water, juice, or soup when you can drink without vomiting. °? Make sure you have little or no nausea before eating solid foods. °General instructions °· Have a responsible adult stay with you until you are awake and alert. °· Take over-the-counter and prescription medicines only as told by your health care provider. °· If you smoke, do not smoke without supervision. °· Keep all follow-up visits as told by your health care provider. This is important. °Contact a health care provider if: °· You keep feeling nauseous or you keep vomiting. °· You feel light-headed. °· You develop a rash. °· You have a fever. °Get help right away if: °· You have trouble breathing. °This information is not intended to replace advice given to you by your health care provider. Make sure you discuss any questions you have  with your health care provider. °Document Released: 11/12/2012 Document Revised: 01/04/2017 Document Reviewed: 05/14/2015 °Elsevier Patient Education © 2020 [employer] °Implanted Port Home Guide °An implanted port is a device that is placed under the skin. It is usually placed in the chest. The device can be used to give IV medicine, to take blood, or for dialysis. You may have an implanted port if: °· You need IV medicine that would be irritating to the small veins in your hands or arms. °· You need IV medicines, such as antibiotics, for a long period of time. °· You need IV nutrition for a long period of time. °· You need dialysis. °Having a port means that your health care provider will not need to use the veins in your arms for these procedures. You may have fewer limitations when using a port than you would if you used other types of long-term IVs, and you will likely be able to return to normal activities after your incision heals. °An implanted port has two main parts: °· Reservoir. The reservoir is the part where a needle is inserted to give medicines or draw blood. The reservoir is round. After it is placed, it appears as a small, raised area under your skin. °· Catheter. The catheter is a thin, flexible tube that connects the reservoir to a vein. Medicine that is inserted into the reservoir goes into the catheter and then into the vein. °How is my port accessed? °To access your port: °· A numbing cream may be placed on the skin over the port site. °· Your   health care provider will put on a mask and sterile gloves. °· The skin over your port will be cleaned carefully with a germ-killing soap and allowed to dry. °· Your health care provider will gently pinch the port and insert a needle into it. °· Your health care provider will check for a blood return to make sure the port is in the vein and is not clogged. °· If your port needs to remain accessed to get medicine continuously (constant infusion), your  health care provider will place a clear bandage (dressing) over the needle site. The dressing and needle will need to be changed every week, or as told by your health care provider. °What is flushing? °Flushing helps keep the port from getting clogged. Follow instructions from your health care provider about how and when to flush the port. Ports are usually flushed with saline solution or a medicine called heparin. The need for flushing will depend on how the port is used: °· If the port is only used from time to time to give medicines or draw blood, the port may need to be flushed: °? Before and after medicines have been given. °? Before and after blood has been drawn. °? As part of routine maintenance. Flushing may be recommended every 4-6 weeks. °· If a constant infusion is running, the port may not need to be flushed. °· Throw away any syringes in a disposal container that is meant for sharp items (sharps container). You can buy a sharps container from a pharmacy, or you can make one by using an empty hard plastic bottle with a cover. °How long will my port stay implanted? °The port can stay in for as long as your health care provider thinks it is needed. When it is time for the port to come out, a surgery will be done to remove it. The surgery will be similar to the procedure that was done to put the port in. °Follow these instructions at home: ° °· Flush your port as told by your health care provider. °· If you need an infusion over several days, follow instructions from your health care provider about how to take care of your port site. Make sure you: °? Wash your hands with soap and water before you change your dressing. If soap and water are not available, use alcohol-based hand sanitizer. °? Change your dressing as told by your health care provider. °? Place any used dressings or infusion bags into a plastic bag. Throw that bag in the trash. °? Keep the dressing that covers the needle clean and dry. Do not  get it wet. °? Do not use scissors or sharp objects near the tube. °? Keep the tube clamped, unless it is being used. °· Check your port site every day for signs of infection. Check for: °? Redness, swelling, or pain. °? Fluid or blood. °? Pus or a bad smell. °· Protect the skin around the port site. °? Avoid wearing bra straps that rub or irritate the site. °? Protect the skin around your port from seat belts. Place a soft pad over your chest if needed. °· Bathe or shower as told by your health care provider. The site may get wet as long as you are not actively receiving an infusion. °· Return to your normal activities as told by your health care provider. Ask your health care provider what activities are safe for you. °· Carry a medical alert card or wear a medical alert bracelet at all   times. This will let health care providers know that you have an implanted port in case of an emergency. °Get help right away if: °· You have redness, swelling, or pain at the port site. °· You have fluid or blood coming from your port site. °· You have pus or a bad smell coming from the port site. °· You have a fever. °Summary °· Implanted ports are usually placed in the chest for long-term IV access. °· Follow instructions from your health care provider about flushing the port and changing bandages (dressings). °· Take care of the area around your port by avoiding clothing that puts pressure on the area, and by watching for signs of infection. °· Protect the skin around your port from seat belts. Place a soft pad over your chest if needed. °· Get help right away if you have a fever or you have redness, swelling, pain, drainage, or a bad smell at the port site. °This information is not intended to replace advice given to you by your health care provider. Make sure you discuss any questions you have with your health care provider. °Document Released: 01/22/2005 Document Revised: 05/16/2018 Document Reviewed: 02/25/2016 °Elsevier  Patient Education © 2020 [employer] ° °

## 2018-08-14 NOTE — H&P (Signed)
[facility] History & Physical Update  The patient was interviewed and re-examined.  The patient's previous History and Physical has been reviewed and is unchanged.  There is no change in the plan of care. We plan to proceed with the scheduled procedure.  Leotis Pain, MD  08/14/2018, 8:14 AM

## 2018-08-14 NOTE — Op Note (Signed)
      [facility]       Operative Note  Date: 08/14/2018  Preoperative diagnosis:  1. Lung cancer  Postoperative diagnosis:  Same as above  Procedures: #1. Ultrasound guidance for vascular access to the right internal jugular vein. #2. Fluoroscopic guidance for placement of catheter. #3. Placement of CT compatible Port-A-Cath, right internal jugular vein.  Surgeon: Leotis Pain, MD.   Anesthesia: Local with moderate conscious sedation for approximately 20  minutes using 2 mg of Versed and 50 mcg of Fentanyl  Fluoroscopy time: less than 1 minute  Contrast used: 0  Estimated blood loss: 5 cc  Indication for the procedure:  The patient is a 68 y.o.female with lung cancer.  The patient needs a Port-A-Cath for durable venous access, chemotherapy, lab draws, and CT scans. We are asked to place this. Risks and benefits were discussed and informed consent was obtained.  Description of procedure: The patient was brought to the vascular and interventional radiology suite.  Moderate conscious sedation was administered throughout the procedure during a face to face encounter with the patient with my supervision of the RN administering medicines and monitoring the patient's vital signs, pulse oximetry, telemetry and mental status throughout from the start of the procedure until the patient was taken to the recovery room. The right neck chest and shoulder were sterilely prepped and draped, and a sterile surgical field was created. Ultrasound was used to help visualize a patent right internal jugular vein. This was then accessed under direct ultrasound guidance without difficulty with the Seldinger needle and a permanent image was recorded. A J-wire was placed. After skin nick and dilatation, the peel-away sheath was then placed over the wire. I then anesthetized an area under the clavicle approximately 1-2 fingerbreadths. A transverse incision was created and an inferior pocket was  created with electrocautery and blunt dissection. The port was then brought onto the field, placed into the pocket and secured to the chest wall with 2 Prolene sutures. The catheter was connected to the port and tunneled from the subclavicular incision to the access site. Fluoroscopic guidance was then used to cut the catheter to an appropriate length. The catheter was then placed through the peel-away sheath and the peel-away sheath was removed. The catheter tip was parked in excellent location under fluorocoscopic guidance in the cavoatrial junction. The pocket was then irrigated with antibiotic impregnated saline and the wound was closed with a running 3-0 Vicryl and a 4-0 Monocryl. The access incision was closed with a single 4-0 Monocryl. The Huber needle was used to withdraw blood and flush the port with heparinized saline. Dermabond was then placed as a dressing. The patient tolerated the procedure well and was taken to the recovery room in stable condition.   Leotis Pain 08/14/2018 8:58 AM   This note was created with Dragon Medical transcription system. Any errors in dictation are purely unintentional.

## 2018-08-15 ENCOUNTER — Encounter: Payer: Self-pay | Admitting: Vascular Surgery

## 2018-08-18 ENCOUNTER — Inpatient Hospital Stay: Payer: PPO

## 2018-08-18 ENCOUNTER — Other Ambulatory Visit: Payer: Self-pay

## 2018-08-18 ENCOUNTER — Encounter: Payer: Self-pay | Admitting: *Deleted

## 2018-08-18 ENCOUNTER — Inpatient Hospital Stay (HOSPITAL_BASED_OUTPATIENT_CLINIC_OR_DEPARTMENT_OTHER): Payer: PPO | Admitting: Hospice and Palliative Medicine

## 2018-08-18 ENCOUNTER — Encounter: Payer: Self-pay | Admitting: Oncology

## 2018-08-18 ENCOUNTER — Other Ambulatory Visit: Payer: Self-pay | Admitting: Family Medicine

## 2018-08-18 ENCOUNTER — Inpatient Hospital Stay (HOSPITAL_BASED_OUTPATIENT_CLINIC_OR_DEPARTMENT_OTHER): Payer: PPO | Admitting: Oncology

## 2018-08-18 VITALS — BP 147/89 | Temp 96.1°F | Resp 18 | Wt 174.9 lb

## 2018-08-18 DIAGNOSIS — I10 Essential (primary) hypertension: Secondary | ICD-10-CM

## 2018-08-18 DIAGNOSIS — C7951 Secondary malignant neoplasm of bone: Secondary | ICD-10-CM | POA: Diagnosis not present

## 2018-08-18 DIAGNOSIS — Z823 Family history of stroke: Secondary | ICD-10-CM

## 2018-08-18 DIAGNOSIS — F329 Major depressive disorder, single episode, unspecified: Secondary | ICD-10-CM

## 2018-08-18 DIAGNOSIS — Z8249 Family history of ischemic heart disease and other diseases of the circulatory system: Secondary | ICD-10-CM

## 2018-08-18 DIAGNOSIS — I2510 Atherosclerotic heart disease of native coronary artery without angina pectoris: Secondary | ICD-10-CM

## 2018-08-18 DIAGNOSIS — K7460 Unspecified cirrhosis of liver: Secondary | ICD-10-CM

## 2018-08-18 DIAGNOSIS — Z923 Personal history of irradiation: Secondary | ICD-10-CM

## 2018-08-18 DIAGNOSIS — C3411 Malignant neoplasm of upper lobe, right bronchus or lung: Secondary | ICD-10-CM

## 2018-08-18 DIAGNOSIS — E785 Hyperlipidemia, unspecified: Secondary | ICD-10-CM

## 2018-08-18 DIAGNOSIS — M549 Dorsalgia, unspecified: Secondary | ICD-10-CM

## 2018-08-18 DIAGNOSIS — Z5112 Encounter for antineoplastic immunotherapy: Secondary | ICD-10-CM

## 2018-08-18 DIAGNOSIS — Z5111 Encounter for antineoplastic chemotherapy: Secondary | ICD-10-CM

## 2018-08-18 DIAGNOSIS — Z87891 Personal history of nicotine dependence: Secondary | ICD-10-CM

## 2018-08-18 DIAGNOSIS — I6782 Cerebral ischemia: Secondary | ICD-10-CM

## 2018-08-18 DIAGNOSIS — G893 Neoplasm related pain (acute) (chronic): Secondary | ICD-10-CM

## 2018-08-18 DIAGNOSIS — R197 Diarrhea, unspecified: Secondary | ICD-10-CM

## 2018-08-18 DIAGNOSIS — Z8041 Family history of malignant neoplasm of ovary: Secondary | ICD-10-CM

## 2018-08-18 DIAGNOSIS — Z79899 Other long term (current) drug therapy: Secondary | ICD-10-CM

## 2018-08-18 DIAGNOSIS — D649 Anemia, unspecified: Secondary | ICD-10-CM

## 2018-08-18 DIAGNOSIS — E876 Hypokalemia: Secondary | ICD-10-CM

## 2018-08-18 DIAGNOSIS — J449 Chronic obstructive pulmonary disease, unspecified: Secondary | ICD-10-CM

## 2018-08-18 DIAGNOSIS — R5383 Other fatigue: Secondary | ICD-10-CM

## 2018-08-18 DIAGNOSIS — I700 Atherosclerosis of aorta: Secondary | ICD-10-CM

## 2018-08-18 DIAGNOSIS — D638 Anemia in other chronic diseases classified elsewhere: Secondary | ICD-10-CM

## 2018-08-18 LAB — CBC WITH DIFFERENTIAL/PLATELET
Abs Immature Granulocytes: 0.02 10*3/uL (ref 0.00–0.07)
Basophils Absolute: 0 10*3/uL (ref 0.0–0.1)
Basophils Relative: 1 %
Eosinophils Absolute: 0.2 10*3/uL (ref 0.0–0.5)
Eosinophils Relative: 5 %
HCT: 34.8 % — ABNORMAL LOW (ref 36.0–46.0)
Hemoglobin: 10.7 g/dL — ABNORMAL LOW (ref 12.0–15.0)
Immature Granulocytes: 1 %
Lymphocytes Relative: 13 %
Lymphs Abs: 0.5 10*3/uL — ABNORMAL LOW (ref 0.7–4.0)
MCH: 26.3 pg (ref 26.0–34.0)
MCHC: 30.7 g/dL (ref 30.0–36.0)
MCV: 85.5 fL (ref 80.0–100.0)
Monocytes Absolute: 0.3 10*3/uL (ref 0.1–1.0)
Monocytes Relative: 8 %
Neutro Abs: 2.6 10*3/uL (ref 1.7–7.7)
Neutrophils Relative %: 72 %
Platelets: 110 10*3/uL — ABNORMAL LOW (ref 150–400)
RBC: 4.07 MIL/uL (ref 3.87–5.11)
RDW: 16.9 % — ABNORMAL HIGH (ref 11.5–15.5)
WBC: 3.6 10*3/uL — ABNORMAL LOW (ref 4.0–10.5)
nRBC: 0 % (ref 0.0–0.2)

## 2018-08-18 LAB — COMPREHENSIVE METABOLIC PANEL
ALT: 12 U/L (ref 0–44)
AST: 20 U/L (ref 15–41)
Albumin: 3.4 g/dL — ABNORMAL LOW (ref 3.5–5.0)
Alkaline Phosphatase: 147 U/L — ABNORMAL HIGH (ref 38–126)
Anion gap: 6 (ref 5–15)
BUN: 9 mg/dL (ref 8–23)
CO2: 29 mmol/L (ref 22–32)
Calcium: 8.6 mg/dL — ABNORMAL LOW (ref 8.9–10.3)
Chloride: 106 mmol/L (ref 98–111)
Creatinine, Ser: 0.67 mg/dL (ref 0.44–1.00)
GFR calc Af Amer: >60 mL/min (ref >60)
GFR calc non Af Amer: >60 mL/min (ref >60)
Glucose, Bld: 101 mg/dL — ABNORMAL HIGH (ref 70–99)
Potassium: 3 mmol/L — ABNORMAL LOW (ref 3.5–5.1)
Sodium: 141 mmol/L (ref 135–145)
Total Bilirubin: 0.6 mg/dL (ref 0.3–1.2)
Total Protein: 6.8 g/dL (ref 6.5–8.1)

## 2018-08-18 LAB — TSH: TSH: 3.236 u[IU]/mL (ref 0.350–4.500)

## 2018-08-18 MED ORDER — SODIUM CHLORIDE 0.9 % IV SOLN
1200.0000 mg | Freq: Once | INTRAVENOUS | Status: AC
  Administered 2018-08-18: 1200 mg via INTRAVENOUS
  Filled 2018-08-18: qty 20

## 2018-08-18 MED ORDER — POTASSIUM CHLORIDE CRYS ER 20 MEQ PO TBCR: 20.0000 meq | EXTENDED_RELEASE_TABLET | Freq: Every day | ORAL | 0 refills | Status: DC

## 2018-08-18 MED ORDER — SODIUM CHLORIDE 0.9 % IV SOLN
100.0000 mg/m2 | Freq: Once | INTRAVENOUS | Status: AC
  Administered 2018-08-18: 190 mg via INTRAVENOUS
  Filled 2018-08-18: qty 9.5

## 2018-08-18 MED ORDER — SODIUM CHLORIDE 0.9 % IV SOLN
Freq: Once | INTRAVENOUS | Status: AC
  Administered 2018-08-18: 10:00:00 via INTRAVENOUS
  Filled 2018-08-18: qty 5

## 2018-08-18 MED ORDER — SODIUM CHLORIDE 0.9 % IV SOLN
462.5000 mg | Freq: Once | INTRAVENOUS | Status: AC
  Administered 2018-08-18: 460 mg via INTRAVENOUS
  Filled 2018-08-18: qty 46

## 2018-08-18 MED ORDER — HEPARIN SOD (PORK) LOCK FLUSH 100 UNIT/ML IV SOLN
500.0000 [IU] | Freq: Once | INTRAVENOUS | Status: AC | PRN
  Administered 2018-08-18: 500 [IU]
  Filled 2018-08-18: qty 5

## 2018-08-18 MED ORDER — PALONOSETRON HCL INJECTION 0.25 MG/5ML
0.2500 mg | Freq: Once | INTRAVENOUS | Status: AC
  Administered 2018-08-18: 0.25 mg via INTRAVENOUS
  Filled 2018-08-18: qty 5

## 2018-08-18 MED ORDER — SODIUM CHLORIDE 0.9 % IV SOLN
Freq: Once | INTRAVENOUS | Status: AC
  Administered 2018-08-18: 10:00:00 via INTRAVENOUS
  Filled 2018-08-18: qty 250

## 2018-08-18 NOTE — Progress Notes (Signed)
Hematology/Oncology Consult note 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 She has been referred to [place] for findings of bone metastases and her recent MRI. She has a prior history of 3 packs/day day smoking for over 45 years and quit smoking 5 years ago.She had a CT chest abdomen pelvis in 2018 which showed a 5 mm lung nodule in the left lower lobe. Recently over the last 2 months patient has been having worsening back pain and was referred to orthopedics. 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 Pathologic fracture of S1.  PET scan showed 2 RUL lung nodules, hilar and mediastinal adenopathy and widespread bone mets. MRI brain negative.  Patient completed palliative RT to her spine. Bronchoscopy showed small cell lung cancer  Interval history-she still has ongoing back pain but she is hardly using her oxycodone and is only used 1 or 2 doses per week.  Reports no difficulty ambulating.  Denies any fevers chills cough or shortness of breath.  ECOG PS- 1 Pain scale- 0 Opioid associated constipation- no  Review of systems- Review of  Systems  Constitutional: Negative for chills, fever, malaise/fatigue and weight loss.  HENT: Negative for congestion, ear discharge and nosebleeds.   Eyes: Negative for blurred vision.  Respiratory: Negative for cough, hemoptysis, sputum production, shortness of breath and wheezing.   Cardiovascular: Negative for chest pain, palpitations, orthopnea and claudication.  Gastrointestinal: Negative for abdominal pain, blood in stool, constipation, diarrhea, heartburn, melena, nausea and vomiting.  Genitourinary: Negative for dysuria, flank pain, frequency, hematuria and urgency.  Musculoskeletal: Negative for back pain, joint pain and myalgias.  Skin: Negative for rash.  Neurological: Negative for dizziness, tingling, focal weakness, seizures, weakness and headaches.  Endo/Heme/Allergies: Does not bruise/bleed easily.  Psychiatric/Behavioral: Negative for depression and suicidal ideas. The patient does not have insomnia.       Allergies  Allergen Reactions   Bee Venom Swelling     Past Medical History:  Diagnosis Date   Arthritis    Cirrhosis ([place])    Depression    GERD (gastroesophageal reflux disease)    Hyperlipidemia    Hypertension    Metastatic bone cancer [facility])      Past Surgical History:  Procedure Laterality Date   APPENDECTOMY  1971   CHOLECYSTECTOMY  1971   COLONOSCOPY WITH PROPOFOL N/A 11/15/2016   Procedure: COLONOSCOPY WITH PROPOFOL;  Surgeon: Jonathon Bellows, MD;  Location: [facility] ENDOSCOPY;  Service: Gastroenterology;  Laterality: N/A;   ENDOBRONCHIAL ULTRASOUND Right 07/30/2018   Procedure: ENDOBRONCHIAL ULTRASOUND;  Surgeon: Laverle Hobby, MD;  Location: [facility] ORS;  Service: Pulmonary;  Laterality: Right;   ESOPHAGOGASTRODUODENOSCOPY (EGD) WITH PROPOFOL N/A 01/07/2017   Procedure: ESOPHAGOGASTRODUODENOSCOPY (EGD) WITH PROPOFOL;  Surgeon: Jonathon Bellows,  MD;  Location: [facility] ENDOSCOPY;  Service: Gastroenterology;  Laterality: N/A;   LAPAROSCOPY N/A  03/01/2017   Procedure: LAPAROSCOPY DIAGNOSTIC;  Surgeon: Robert Bellow, MD;  Location: [facility] ORS;  Service: General;  Laterality: N/A;   PORTA CATH INSERTION N/A 08/14/2018   Procedure: PORTA CATH INSERTION;  Surgeon: Algernon Huxley, MD;  Location: [place] [facility] CV LAB;  Service: Cardiovascular;  Laterality: N/A;   TONSILECTOMY, ADENOIDECTOMY, BILATERAL MYRINGOTOMY AND TUBES  1955   TONSILLECTOMY     VENTRAL HERNIA REPAIR N/A 03/01/2017   10 x 14 CM Ventralight ST mesh, intraperitoneal location.    VENTRAL HERNIA REPAIR N/A 03/01/2017   Procedure: HERNIA REPAIR VENTRAL ADULT;  Surgeon: Robert Bellow, MD;  Location: [facility] ORS;  Service: General;  Laterality: N/A;    Social History   Socioeconomic History   Marital status: Married    Spouse name: Johnny   Number of children: Not on file   Years of education: Not on file   Highest education level: Not on file  Occupational History   Not on file  Social Needs   Financial resource strain: Not hard at all   Food insecurity    Worry: Never true    Inability: Never true   Transportation needs    Medical: No    Non-medical: No  Tobacco Use   Smoking status: Former Smoker    Packs/day: 2.00    Years: 50.00    Pack years: 100.00    Types: Cigarettes    Quit date: 02/07/2012    Years since quitting: 6.5   Smokeless tobacco: Never Used   Tobacco comment: uses vape without nicotine, one capsule every 3 months.  Substance and Sexual Activity   Alcohol use: No    Alcohol/week: 0.0 standard drinks   Drug use: No   Sexual activity: Yes  Lifestyle   Physical activity    Days per week: Not on file    Minutes per session: Not on file   Stress: Not at all  Relationships   Social connections    Talks on phone: Not on file    Gets together: Not on file    Attends religious service: Not on file    Active member of club or organization: Not on file    Attends meetings of clubs or organizations: Not on file     Relationship status: Not on file   Intimate partner violence    Fear of current or ex partner: No    Emotionally abused: No    Physically abused: No    Forced sexual activity: No  Other Topics Concern   Not on file  Social History Narrative   Married   Retired   Clinical cytogeneticist level of education    No children    1 cup of coffee    Family History  Problem Relation Age of Onset   Hypertension Mother    Ovarian cancer Mother 24   Heart disease Father    Stroke Father    Ovarian cancer Sister        ? dx cancer had hyst.   Breast cancer Neg Hx      Current Outpatient Medications:    atorvastatin (LIPITOR) 10 MG tablet, Take 10 mg by mouth daily., Disp: , Rfl:    buPROPion (WELLBUTRIN XL) 300 MG 24 hr tablet, Take 1 tablet (300 mg total) by mouth daily., Disp: 90 tablet, Rfl: 1   escitalopram (LEXAPRO) 10 MG tablet, Take 1 tablet (10  mg total) by mouth daily., Disp: 90 tablet, Rfl: 1   ibuprofen (ADVIL,MOTRIN) 200 MG tablet, Take 400 mg by mouth every 8 (eight) hours as needed for headache or moderate pain. , Disp: , Rfl:    lidocaine-prilocaine (EMLA) cream, Apply to affected area once, Disp: 30 g, Rfl: 3   LORazepam (ATIVAN) 0.5 MG tablet, Take 1 tablet (0.5 mg total) by mouth every 6 (six) hours as needed (Nausea or vomiting)., Disp: 30 tablet, Rfl: 0   Multiple Vitamins-Minerals (PRESERVISION AREDS 2) CAPS, Take 1 capsule by mouth 2 (two) times a day. , Disp: , Rfl:    ondansetron (ZOFRAN) 8 MG tablet, Take 1 tablet (8 mg total) by mouth 2 (two) times daily as needed for refractory nausea / vomiting. Start on day 3 after carboplatin chemo., Disp: 30 tablet, Rfl: 1   oxyCODONE (OXY IR/ROXICODONE) 5 MG immediate release tablet, Take 1 tablet (5 mg total) by mouth every 6 (six) hours as needed for severe pain., Disp: 120 tablet, Rfl: 0   pantoprazole (PROTONIX) 40 MG tablet, TAKE 1 TABLET BY MOUTH TWICE DAILY BEFORE A MEAL (Patient taking differently: Take 40  mg by mouth 2 (two) times daily before a meal. ), Disp: 180 tablet, Rfl: 0   potassium chloride SA (K-DUR) 20 MEQ tablet, Take 1 tablet (20 mEq total) by mouth daily., Disp: 10 tablet, Rfl: 0   prochlorperazine (COMPAZINE) 10 MG tablet, Take 1 tablet (10 mg total) by mouth every 6 (six) hours as needed (Nausea or vomiting)., Disp: 30 tablet, Rfl: 1   rosuvastatin (CRESTOR) 20 MG tablet, TAKE 1 TABLET BY MOUTH ONCE DAILY, Disp: 90 tablet, Rfl: 3  Physical exam:  Vitals:   08/18/18 0856  BP: (!) 147/89  Resp: 18  Temp: (!) 96.1 F (35.6 C)  Weight: 174 lb 14.4 oz (79.3 kg)   Physical Exam Constitutional:      General: She is not in acute distress. HENT:     Head: Normocephalic and atraumatic.  Eyes:     Pupils: Pupils are equal, round, and reactive to light.  Neck:     Musculoskeletal: Normal range of motion.  Cardiovascular:     Rate and Rhythm: Normal rate and regular rhythm.     Heart sounds: Normal heart sounds.  Pulmonary:     Effort: Pulmonary effort is normal.     Breath sounds: Normal breath sounds.  Abdominal:     General: Bowel sounds are normal.     Palpations: Abdomen is soft.  Skin:    General: Skin is warm and dry.  Neurological:     Mental Status: She is alert and oriented to person, place, and time.      CMP Latest Ref Rng & Units 08/18/2018  Glucose 70 - 99 mg/dL 101(H)  BUN 8 - 23 mg/dL 9  Creatinine 0.44 - 1.00 mg/dL 0.67  Sodium 135 - 145 mmol/L 141  Potassium 3.5 - 5.1 mmol/L 3.0(L)  Chloride 98 - 111 mmol/L 106  CO2 22 - 32 mmol/L 29  Calcium 8.9 - 10.3 mg/dL 8.6(L)  Total Protein 6.5 - 8.1 g/dL 6.8  Total Bilirubin 0.3 - 1.2 mg/dL 0.6  Alkaline Phos 38 - 126 U/L 147(H)  AST 15 - 41 U/L 20  ALT 0 - 44 U/L 12   CBC Latest Ref Rng & Units 08/18/2018  WBC 4.0 - 10.5 K/uL 3.6(L)  Hemoglobin 12.0 - 15.0 g/dL 10.7(L)  Hematocrit 36.0 - 46.0 % 34.8(L)  Platelets 150 - 400  K/uL 110(L)    No images are attached to the encounter.  Dg Chest 1  View  Result Date: 07/30/2018 CLINICAL DATA:  Metastatic cancer. Status post bronchoscopy with biopsies. EXAM: CHEST  1 VIEW COMPARISON:  PET-CT dated 07/15/2018 FINDINGS: There is no evidence of pneumothorax or pulmonary hemorrhage or other acute abnormality. No infiltrates or effusions. Mediastinal adenopathy as noted on the prior PET scan. The pulmonary nodule in the right upper lobe posterior medially is obscured by the mediastinal enlargement. No discrete metastatic disease to the skeleton. Old deformity of the posterior aspect of the left seventh rib consistent with an old fracture present on the prior PET-CT. IMPRESSION: No pneumothorax or pulmonary hemorrhage after bronchoscopy with biopsies. Mediastinal adenopathy. Electronically Signed   By: Lorriane Shire M.D.   On: 07/30/2018 15:16   Mr Jeri Cos DU Contrast  Result Date: 07/29/2018 CLINICAL DATA:  Abnormal PET, lung mass. Presumed lung cancer. Staging EXAM: MRI HEAD WITHOUT AND WITH CONTRAST TECHNIQUE: Multiplanar, multiecho pulse sequences of the brain and surrounding structures were obtained without and with intravenous contrast. CONTRAST:  7.5 mL Gadovist IV COMPARISON:  MRI head 03/20/2017 FINDINGS: Brain: Ventricle size and cerebral volume normal. Negative for acute infarct. Scattered small white matter hyperintensities most consistent with chronic microvascular ischemia. Negative for hemorrhage mass or edema Normal enhancement.  No enhancing metastatic deposits identified. Vascular: Normal arterial flow voids. Skull and upper cervical spine: No worrisome skull lesions identified. Sinuses/Orbits: Paranasal sinuses clear.  Bilateral cataract surgery Other: None IMPRESSION: No acute abnormality and  negative for metastatic disease. Mild chronic microvascular ischemia in the white matter. Electronically Signed   By: Franchot Gallo M.D.   On: 07/29/2018 11:13     Assessment and plan- Patient is a 68 y.o. female with extensive stage small cell  lung cancer with bone metastases stage IV cT3 N2 M1.  She is here for on treatment assessment prior to cycle 1 of carboplatin etoposide and Tecentriq.   Counts okay to proceed with cycle 1 of carboplatin etoposide and Tecentriq today.  She will receive etoposide on day 2 and day 3 and receive on pro-Neulasta on day 3.  I will see her back in 10 days time to see how she tolerated her chemotherapy and if she needs any extra IV fluids.  Again discussed risks and benefits of chemotherapy including all but not limited to nausea, vomiting, low blood counts, risk of infections and hospitalizations.  Risk of autoimmune side effects associated with Tecentriq.  Patient understands and agrees to proceed as planned.  Hypokalemia: We will give her 20 mEq of oral potassium for the next 10 days and repeat BMP in 10 days.  Neoplasm related pain: Continue PRN oxycodone.  Anemia of chronic disease.  Continue to monitor.  I will add ferritin and iron studies as well as B12 and folate to the next set of labs.   Visit Diagnosis 1. Small cell lung cancer, right upper lobe ([place])   2. Encounter for antineoplastic chemotherapy and immunotherapy   3. Anemia of chronic disease   4. Hypokalemia      Dr. Randa Evens, MD, MPH [facility] [phone] 08/18/2018 9:23 AM

## 2018-08-18 NOTE — Progress Notes (Signed)
  Oncology Nurse Navigator Documentation  Navigator Location: [facility]Med Onc (08/18/18 1100)   )Navigator Encounter Type: Treatment (08/18/18 1100)                   Treatment Initiated Date: 08/18/18 (08/18/18 1100) Patient Visit Type: MedOnc (08/18/18 1100) Treatment Phase: First Chemo Tx (08/18/18 1100) Barriers/Navigation Needs: No Barriers At This Time (08/18/18 1100)   Interventions: None Required (08/18/18 1100)                      Time Spent with Patient: 30 (08/18/18 1100)

## 2018-08-18 NOTE — Addendum Note (Signed)
Addended by: Luella Cook on: 08/18/2018 12:12 PM   Modules accepted: Orders

## 2018-08-18 NOTE — Progress Notes (Signed)
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 female who presents to chemo care clinic today for initial meeting in preparation for starting chemotherapy. I introduced the chemo care clinic and we discussed that the role of the clinic is to assist those who are at an increased risk of emergency room visits and/or complications during the course of chemotherapy treatment. We discussed that the increased risk takes into account factors such as age, performance status, and co-morbidities. We also discussed that for some, this might include barriers to care such as not having a primary care provider, lack of insurance/transportation, or not being able to afford medications. We discussed that the goal of the program is to help prevent unplanned ER visits and help reduce complications during chemotherapy. We do this by discussing specific risk factors to each individual and identifying ways that we can help improve these risk factors and reduce barriers to care.   Hematology/Oncology History:  Oncology History  Small cell lung cancer, right upper lobe ([place])  08/05/2018 Cancer Staging   Staging form: Lung, AJCC 8th Edition - Clinical stage from 08/05/2018: Stage IVB (cT3, cN2, cM1c) - Signed by Sindy Guadeloupe, MD on 08/06/2018   08/06/2018 Initial Diagnosis   Small cell lung cancer, right upper lobe ([place])   08/18/2018 -  Chemotherapy   The patient had palonosetron (ALOXI)  injection 0.25 mg, 0.25 mg, Intravenous,  Once, 1 of 4 cycles pegfilgrastim (NEULASTA ONPRO KIT) injection 6 mg, 6 mg, Subcutaneous, Once, 1 of 4 cycles CARBOplatin (PARAPLATIN) 460 mg in sodium chloride 0.9 % 250 mL chemo infusion, 460 mg (100 % of original dose 462.5 mg), Intravenous,  Once, 1 of 4 cycles Dose modification:   (original dose 462.5 mg, Cycle 1) etoposide (VEPESID) 190 mg in sodium chloride 0.9 % 500 mL chemo infusion, 100 mg/m2 = 190 mg, Intravenous,  Once, 1 of 4 cycles fosaprepitant (EMEND) 150 mg, dexamethasone (DECADRON) 12 mg in sodium chloride 0.9 % 145 mL IVPB, , Intravenous,  Once, 1 of 4 cycles atezolizumab (TECENTRIQ) 1,200 mg in sodium chloride 0.9 % 250 mL chemo infusion, 1,200 mg, Intravenous, Once, 1 of 8 cycles  for chemotherapy treatment.       Allergies  Allergen Reactions  . Bee Venom Swelling     Past Medical History:  Diagnosis Date  . Arthritis   . Cirrhosis ([place])   . Depression   . GERD (gastroesophageal reflux disease)   . Hyperlipidemia   . Hypertension   . Metastatic bone cancer [facility])      Past Surgical History:  Procedure Laterality Date  . APPENDECTOMY  1971  . CHOLECYSTECTOMY  1971  . COLONOSCOPY WITH PROPOFOL N/A 11/15/2016   Procedure: COLONOSCOPY WITH PROPOFOL;  Surgeon: Jonathon Bellows, MD;  Location: [facility] ENDOSCOPY;  Service: Gastroenterology;  Laterality: N/A;  . ENDOBRONCHIAL ULTRASOUND Right 07/30/2018   Procedure: ENDOBRONCHIAL ULTRASOUND;  Surgeon: Laverle Hobby, MD;  Location: [facility] ORS;  Service: Pulmonary;  Laterality: Right;  . ESOPHAGOGASTRODUODENOSCOPY (EGD) WITH  PROPOFOL N/A 01/07/2017   Procedure: ESOPHAGOGASTRODUODENOSCOPY (EGD) WITH PROPOFOL;  Surgeon: Jonathon Bellows, MD;  Location: [facility] ENDOSCOPY;  Service: Gastroenterology;  Laterality: N/A;  . LAPAROSCOPY N/A 03/01/2017   Procedure: LAPAROSCOPY DIAGNOSTIC;  Surgeon: Robert Bellow, MD;  Location: [facility] ORS;  Service: General;  Laterality: N/A;  . PORTA CATH  INSERTION N/A 08/14/2018   Procedure: PORTA CATH INSERTION;  Surgeon: Algernon Huxley, MD;  Location: [place] CV LAB;  Service: Cardiovascular;  Laterality: N/A;  . TONSILECTOMY, ADENOIDECTOMY, BILATERAL MYRINGOTOMY [place]  . TONSILLECTOMY    . VENTRAL HERNIA REPAIR N/A 03/01/2017   10 x 14 CM Ventralight ST mesh, intraperitoneal location.   . VENTRAL HERNIA REPAIR N/A 03/01/2017   Procedure: HERNIA REPAIR VENTRAL ADULT;  Surgeon: Robert Bellow, MD;  Location: [facility] ORS;  Service: General;  Laterality: N/A;    Social History   Socioeconomic History  . Marital status: Married    Spouse name: [name]  . Number of children: Not on file  . Years of education: Not on file  . Highest education level: Not on file  Occupational History  . Not on file  Social Needs  . Financial resource strain: Not hard at all  . Food insecurity    Worry: Never true    Inability: Never true  . Transportation needs    Medical: No    Non-medical: No  Tobacco Use  . Smoking status: Former Smoker    Packs/day: 2.00    Years: 50.00    Pack years: 100.00    Types: Cigarettes    Quit date: 02/07/2012    Years since quitting: 6.5  . Smokeless tobacco: Never Used  . Tobacco comment: uses vape without nicotine, one capsule every 3 months.  Substance and Sexual Activity  . Alcohol use: No    Alcohol/week: 0.0 standard drinks  . Drug use: No  . Sexual activity: Yes  Lifestyle  . Physical activity    Days per week: Not on file    Minutes per session: Not on file  . Stress: Not at all  Relationships  . Social Herbalist on phone: Not on file    Gets together: Not on file    Attends religious service: Not on file    Active member of club or organization: Not on file    Attends meetings of clubs or organizations: Not on file    Relationship status: Not on file  . Intimate partner violence    Fear of current or ex partner: No    Emotionally abused: No    Physically abused: No     Forced sexual activity: No  Other Topics Concern  . Not on file  Social History Narrative   Married   Retired   Clinical cytogeneticist level of education    No children    1 cup of coffee    Family History  Problem Relation Age of Onset  . Hypertension Mother   . Ovarian cancer Mother 4  . Heart disease Father   . Stroke Father   . Ovarian cancer Sister        ? dx cancer had hyst.  . Breast cancer Neg Hx     Current Outpatient Medications  Medication Sig Dispense Refill  . atorvastatin (LIPITOR) 10 MG tablet Take 10 mg by mouth daily.    Marland Kitchen buPROPion (WELLBUTRIN XL) 300 MG 24 hr tablet Take 1 tablet (300 mg total) by mouth daily. [place]  tablet 1  . escitalopram (LEXAPRO) 10 MG tablet Take 1 tablet (10 mg total) by mouth daily. 90 tablet 1  . ibuprofen (ADVIL,MOTRIN) 200 MG tablet Take 400 mg by mouth every 8 (eight) hours as needed for headache or moderate pain.     Marland Kitchen lidocaine-prilocaine (EMLA) cream Apply to affected area once 30 g 3  . LORazepam (ATIVAN) 0.5 MG tablet Take 1 tablet (0.5 mg total) by mouth every 6 (six) hours as needed (Nausea or vomiting). 30 tablet 0  . Multiple Vitamins-Minerals (PRESERVISION AREDS 2) CAPS Take 1 capsule by mouth 2 (two) times a day.     . ondansetron (ZOFRAN) 8 MG tablet Take 1 tablet (8 mg total) by mouth 2 (two) times daily as needed for refractory nausea / vomiting. Start on day 3 after carboplatin chemo. 30 tablet 1  . oxyCODONE (OXY IR/ROXICODONE) 5 MG immediate release tablet Take 1 tablet (5 mg total) by mouth every 6 (six) hours as needed for severe pain. 120 tablet 0  . pantoprazole (PROTONIX) 40 MG tablet TAKE 1 TABLET BY MOUTH TWICE DAILY BEFORE A MEAL (Patient taking differently: Take 40 mg by mouth 2 (two) times daily before a meal. ) 180 tablet 0  . potassium chloride SA (K-DUR) 20 MEQ tablet Take 1 tablet (20 mEq total) by mouth daily. 10 tablet 0  . prochlorperazine (COMPAZINE) 10 MG tablet Take 1 tablet (10 mg total) by mouth every  6 (six) hours as needed (Nausea or vomiting). 30 tablet 1  . rosuvastatin (CRESTOR) 20 MG tablet TAKE 1 TABLET BY MOUTH ONCE DAILY 90 tablet 3   No current facility-administered medications for this visit.    Facility-Administered Medications Ordered in Other Visits  Medication Dose Route Frequency Provider Last Rate Last Dose  . etoposide (VEPESID) 190 mg in sodium chloride 0.9 % 500 mL chemo infusion  100 mg/m2 (Treatment Plan Recorded) Intravenous Once Sindy Guadeloupe, MD 510 mL/hr at 08/18/18 1239 190 mg at 08/18/18 1239  . heparin lock flush 100 unit/mL  500 Units Intracatheter Once PRN Sindy Guadeloupe, MD         PERFORMANCE STATUS (ECOG) : 1 - Symptomatic but completely ambulatory  Review of Systems As noted above. Otherwise, a complete review of systems is negative.  Physical Exam General: NAD, frail appearing, thin Pulmonary: unlabored Extremities: no edema Skin: no rashes Neurological: Weakness but otherwise nonfocal   Assessment and Plan:    1. 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  Followed by Dr. Janese Banks.  2. High Risk for ER/Hospitalization during Chemotherapy: We discussed the role of the chemo care clinic and identified patient specific risk factors. We also discussed the role of the Symptom Management and Palliative Care Clinics at [facility] and methods of contacting clinic/provider. She denies needing specific assistance at this time.  Current PCP: Leone Haven, MD  Hospital Admissions: 2   ED Visits: 0  Has Medicaid: No  Has Medicare: Yes  In relationship: Yes  Has Anemia: Yes  Has asthma: No  Has atrial fibrillation: No  Has CVD: No  Has chronic kidney disease: No  Has Chronic Obstructive Pulmonary Disease: Yes   Has Congestive Heart Failure: No  Has Connective Tissue Disorder: No  Has Depression: Yes  Has Diabetes: No  Has liver disease: Yes  Has  Peripheral Vascular Disease: Yes     3. Social Determinants of Health:   Housing -denies any  concerns.  Does have thousand dollar per month house payment and is considering moving into a smaller home.  Food -denies any concerns  Transportation -denies any concerns.  She still drives but her daughters have been transporting her to and from her medical appointments  Utilities -denies any concerns  Safety -denies any concerns  Financial Strain -patient has met with [facility] regarding concerns about paying healthcare costs  Employment -she is retired.  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  Family/Community Support -patient is married.  She has 2 daughters.  Patient feels she has good family support  Physical Activity -patient reports that she is functionally independent.  She is no longer able to do many household chores and relies on her husband to help with those.    Patient expressed understanding and was in agreement with this plan. She also understands that She can call clinic at any time with any questions, concerns, or complaints.   A total of (15) minutes of face-to-face time was spent with this patient with greater than 50% of that time in counseling and care-coordination.   Signed by: Altha Harm, PhD, DNP, NP-C, [facility] [phone] (Work Cell)

## 2018-08-18 NOTE — Progress Notes (Signed)
Patient is here today for follow up, she mentions back pain and its about a 5 1/2.

## 2018-08-19 ENCOUNTER — Encounter: Payer: Self-pay | Admitting: Oncology

## 2018-08-19 ENCOUNTER — Inpatient Hospital Stay: Payer: PPO

## 2018-08-19 ENCOUNTER — Other Ambulatory Visit: Payer: Self-pay

## 2018-08-19 VITALS — BP 149/78 | HR 90 | Temp 98.0°F | Resp 18

## 2018-08-19 DIAGNOSIS — C3411 Malignant neoplasm of upper lobe, right bronchus or lung: Secondary | ICD-10-CM

## 2018-08-19 DIAGNOSIS — Z5112 Encounter for antineoplastic immunotherapy: Secondary | ICD-10-CM | POA: Diagnosis not present

## 2018-08-19 LAB — T4: T4, Total: 9.2 ug/dL (ref 4.5–12.0)

## 2018-08-19 MED ORDER — DEXAMETHASONE SODIUM PHOSPHATE 10 MG/ML IJ SOLN
10.0000 mg | Freq: Once | INTRAMUSCULAR | Status: AC
  Administered 2018-08-19: 10 mg via INTRAVENOUS
  Filled 2018-08-19: qty 1

## 2018-08-19 MED ORDER — SODIUM CHLORIDE 0.9 % IV SOLN
Freq: Once | INTRAVENOUS | Status: AC
  Administered 2018-08-19: 09:00:00 via INTRAVENOUS
  Filled 2018-08-19: qty 250

## 2018-08-19 MED ORDER — HEPARIN SOD (PORK) LOCK FLUSH 100 UNIT/ML IV SOLN
500.0000 [IU] | Freq: Once | INTRAVENOUS | Status: AC | PRN
  Administered 2018-08-19: 500 [IU]
  Filled 2018-08-19: qty 5

## 2018-08-19 MED ORDER — SODIUM CHLORIDE 0.9 % IV SOLN
100.0000 mg/m2 | Freq: Once | INTRAVENOUS | Status: AC
  Administered 2018-08-19: 190 mg via INTRAVENOUS
  Filled 2018-08-19: qty 9.5

## 2018-08-19 MED ORDER — SODIUM CHLORIDE 0.9% FLUSH
10.0000 mL | INTRAVENOUS | Status: DC | PRN
  Filled 2018-08-19: qty 10

## 2018-08-20 ENCOUNTER — Other Ambulatory Visit: Payer: Self-pay

## 2018-08-20 ENCOUNTER — Inpatient Hospital Stay: Payer: PPO

## 2018-08-20 VITALS — BP 152/72 | HR 88 | Temp 97.0°F | Resp 18

## 2018-08-20 DIAGNOSIS — C3411 Malignant neoplasm of upper lobe, right bronchus or lung: Secondary | ICD-10-CM

## 2018-08-20 DIAGNOSIS — Z5112 Encounter for antineoplastic immunotherapy: Secondary | ICD-10-CM | POA: Diagnosis not present

## 2018-08-20 MED ORDER — DEXAMETHASONE SODIUM PHOSPHATE 10 MG/ML IJ SOLN
10.0000 mg | Freq: Once | INTRAMUSCULAR | Status: AC
  Administered 2018-08-20: 10 mg via INTRAVENOUS
  Filled 2018-08-20: qty 1

## 2018-08-20 MED ORDER — HEPARIN SOD (PORK) LOCK FLUSH 100 UNIT/ML IV SOLN
500.0000 [IU] | Freq: Once | INTRAVENOUS | Status: AC | PRN
  Administered 2018-08-20: 500 [IU]
  Filled 2018-08-20: qty 5

## 2018-08-20 MED ORDER — SODIUM CHLORIDE 0.9 % IV SOLN
Freq: Once | INTRAVENOUS | Status: AC
  Administered 2018-08-20: 09:00:00 via INTRAVENOUS
  Filled 2018-08-20: qty 250

## 2018-08-20 MED ORDER — PEGFILGRASTIM 6 MG/0.6ML ~~LOC~~ PSKT
6.0000 mg | PREFILLED_SYRINGE | Freq: Once | SUBCUTANEOUS | Status: AC
  Administered 2018-08-20: 6 mg via SUBCUTANEOUS
  Filled 2018-08-20: qty 0.6

## 2018-08-20 MED ORDER — SODIUM CHLORIDE 0.9 % IV SOLN
100.0000 mg/m2 | Freq: Once | INTRAVENOUS | Status: AC
  Administered 2018-08-20: 190 mg via INTRAVENOUS
  Filled 2018-08-20: qty 9.5

## 2018-08-27 ENCOUNTER — Other Ambulatory Visit: Payer: Self-pay

## 2018-08-28 ENCOUNTER — Inpatient Hospital Stay: Payer: PPO

## 2018-08-28 ENCOUNTER — Inpatient Hospital Stay (HOSPITAL_BASED_OUTPATIENT_CLINIC_OR_DEPARTMENT_OTHER): Payer: PPO | Admitting: Oncology

## 2018-08-28 ENCOUNTER — Other Ambulatory Visit: Payer: Self-pay

## 2018-08-28 ENCOUNTER — Encounter: Payer: Self-pay | Admitting: Oncology

## 2018-08-28 ENCOUNTER — Inpatient Hospital Stay (HOSPITAL_BASED_OUTPATIENT_CLINIC_OR_DEPARTMENT_OTHER): Payer: PPO | Admitting: Hospice and Palliative Medicine

## 2018-08-28 VITALS — BP 110/52 | HR 93 | Temp 97.4°F | Resp 14 | Wt 169.3 lb

## 2018-08-28 DIAGNOSIS — E876 Hypokalemia: Secondary | ICD-10-CM

## 2018-08-28 DIAGNOSIS — Z8041 Family history of malignant neoplasm of ovary: Secondary | ICD-10-CM

## 2018-08-28 DIAGNOSIS — D696 Thrombocytopenia, unspecified: Secondary | ICD-10-CM

## 2018-08-28 DIAGNOSIS — K7460 Unspecified cirrhosis of liver: Secondary | ICD-10-CM

## 2018-08-28 DIAGNOSIS — I2510 Atherosclerotic heart disease of native coronary artery without angina pectoris: Secondary | ICD-10-CM

## 2018-08-28 DIAGNOSIS — I10 Essential (primary) hypertension: Secondary | ICD-10-CM

## 2018-08-28 DIAGNOSIS — E785 Hyperlipidemia, unspecified: Secondary | ICD-10-CM | POA: Diagnosis not present

## 2018-08-28 DIAGNOSIS — Z95828 Presence of other vascular implants and grafts: Secondary | ICD-10-CM

## 2018-08-28 DIAGNOSIS — J449 Chronic obstructive pulmonary disease, unspecified: Secondary | ICD-10-CM

## 2018-08-28 DIAGNOSIS — Z823 Family history of stroke: Secondary | ICD-10-CM

## 2018-08-28 DIAGNOSIS — M549 Dorsalgia, unspecified: Secondary | ICD-10-CM

## 2018-08-28 DIAGNOSIS — R197 Diarrhea, unspecified: Secondary | ICD-10-CM | POA: Diagnosis not present

## 2018-08-28 DIAGNOSIS — C7951 Secondary malignant neoplasm of bone: Secondary | ICD-10-CM

## 2018-08-28 DIAGNOSIS — R5383 Other fatigue: Secondary | ICD-10-CM | POA: Diagnosis not present

## 2018-08-28 DIAGNOSIS — T451X5A Adverse effect of antineoplastic and immunosuppressive drugs, initial encounter: Secondary | ICD-10-CM

## 2018-08-28 DIAGNOSIS — Z79899 Other long term (current) drug therapy: Secondary | ICD-10-CM

## 2018-08-28 DIAGNOSIS — F329 Major depressive disorder, single episode, unspecified: Secondary | ICD-10-CM

## 2018-08-28 DIAGNOSIS — D701 Agranulocytosis secondary to cancer chemotherapy: Secondary | ICD-10-CM

## 2018-08-28 DIAGNOSIS — Z8249 Family history of ischemic heart disease and other diseases of the circulatory system: Secondary | ICD-10-CM

## 2018-08-28 DIAGNOSIS — Z923 Personal history of irradiation: Secondary | ICD-10-CM

## 2018-08-28 DIAGNOSIS — G893 Neoplasm related pain (acute) (chronic): Secondary | ICD-10-CM

## 2018-08-28 DIAGNOSIS — C3411 Malignant neoplasm of upper lobe, right bronchus or lung: Secondary | ICD-10-CM

## 2018-08-28 DIAGNOSIS — I700 Atherosclerosis of aorta: Secondary | ICD-10-CM

## 2018-08-28 DIAGNOSIS — Z5112 Encounter for antineoplastic immunotherapy: Secondary | ICD-10-CM | POA: Diagnosis not present

## 2018-08-28 DIAGNOSIS — Z87891 Personal history of nicotine dependence: Secondary | ICD-10-CM

## 2018-08-28 DIAGNOSIS — D649 Anemia, unspecified: Secondary | ICD-10-CM

## 2018-08-28 DIAGNOSIS — Z515 Encounter for palliative care: Secondary | ICD-10-CM

## 2018-08-28 DIAGNOSIS — I6782 Cerebral ischemia: Secondary | ICD-10-CM

## 2018-08-28 LAB — CBC WITH DIFFERENTIAL/PLATELET
Abs Immature Granulocytes: 0.02 10*3/uL (ref 0.00–0.07)
Basophils Absolute: 0 10*3/uL (ref 0.0–0.1)
Basophils Relative: 1 %
Eosinophils Absolute: 0.1 10*3/uL (ref 0.0–0.5)
Eosinophils Relative: 7 %
HCT: 30.7 % — ABNORMAL LOW (ref 36.0–46.0)
Hemoglobin: 9.6 g/dL — ABNORMAL LOW (ref 12.0–15.0)
Immature Granulocytes: 3 %
Lymphocytes Relative: 42 %
Lymphs Abs: 0.3 10*3/uL — ABNORMAL LOW (ref 0.7–4.0)
MCH: 26.2 pg (ref 26.0–34.0)
MCHC: 31.3 g/dL (ref 30.0–36.0)
MCV: 83.7 fL (ref 80.0–100.0)
Monocytes Absolute: 0.2 10*3/uL (ref 0.1–1.0)
Monocytes Relative: 22 %
Neutro Abs: 0.2 10*3/uL — ABNORMAL LOW (ref 1.7–7.7)
Neutrophils Relative %: 25 %
Platelets: 71 10*3/uL — ABNORMAL LOW (ref 150–400)
RBC: 3.67 MIL/uL — ABNORMAL LOW (ref 3.87–5.11)
RDW: 16.4 % — ABNORMAL HIGH (ref 11.5–15.5)
Smear Review: NORMAL
WBC: 0.7 10*3/uL — CL (ref 4.0–10.5)
nRBC: 0 % (ref 0.0–0.2)

## 2018-08-28 LAB — COMPREHENSIVE METABOLIC PANEL
ALT: 27 U/L (ref 0–44)
AST: 18 U/L (ref 15–41)
Albumin: 3.5 g/dL (ref 3.5–5.0)
Alkaline Phosphatase: 219 U/L — ABNORMAL HIGH (ref 38–126)
Anion gap: 8 (ref 5–15)
BUN: 14 mg/dL (ref 8–23)
CO2: 26 mmol/L (ref 22–32)
Calcium: 8.8 mg/dL — ABNORMAL LOW (ref 8.9–10.3)
Chloride: 103 mmol/L (ref 98–111)
Creatinine, Ser: 0.95 mg/dL (ref 0.44–1.00)
GFR calc Af Amer: >60 mL/min (ref >60)
GFR calc non Af Amer: >60 mL/min (ref >60)
Glucose, Bld: 107 mg/dL — ABNORMAL HIGH (ref 70–99)
Potassium: 3.6 mmol/L (ref 3.5–5.1)
Sodium: 137 mmol/L (ref 135–145)
Total Bilirubin: 0.8 mg/dL (ref 0.3–1.2)
Total Protein: 6.9 g/dL (ref 6.5–8.1)

## 2018-08-28 LAB — FUNGAL ORGANISM REFLEX

## 2018-08-28 LAB — FUNGUS CULTURE RESULT

## 2018-08-28 LAB — TSH: TSH: 1.955 u[IU]/mL (ref 0.350–4.500)

## 2018-08-28 LAB — FUNGUS CULTURE WITH STAIN

## 2018-08-28 MED ORDER — HEPARIN SOD (PORK) LOCK FLUSH 100 UNIT/ML IV SOLN
500.0000 [IU] | Freq: Once | INTRAVENOUS | Status: AC
  Administered 2018-08-28: 11:00:00 500 [IU] via INTRAVENOUS

## 2018-08-28 MED ORDER — SODIUM CHLORIDE 0.9% FLUSH
10.0000 mL | Freq: Once | INTRAVENOUS | Status: AC
  Administered 2018-08-28: 10:00:00 10 mL via INTRAVENOUS
  Filled 2018-08-28: qty 10

## 2018-08-28 MED ORDER — LOPERAMIDE HCL 2 MG PO CAPS: 2.0000 mg | ORAL_CAPSULE | ORAL | 1 refills | Status: DC | PRN

## 2018-08-28 NOTE — Progress Notes (Signed)
[place]  [phone] [phone]   Name: Anne [name] Date: 08/28/2018 MRN: [id number]  DOB: 1950-05-18  Patient Care Team: Leone Haven, MD as PCP - General (Family Medicine) Caryl Bis Angela Adam, MD as Consulting Physician (Family Medicine) Bary Castilla, Forest Gleason, MD (General Surgery) Telford Nab, RN as Registered Nurse    REASON FOR CONSULTATION: Palliative Care consult requested for this 68 y.o. 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  PMH also notable for hypertension, hyperlipidemia, obesity, cirrhosis and history of tobacco abuse.  MRI of the lumbar spine 07/04/2018 revealed widespread metastatic disease with pathologic fracture of L2 and S1 and a ventral epidural tumor.  Patient is status post palliative XRT to spine.  She is currently on treatment with carboplatin, etoposide, and Tecentriq. She was referred to palliative care to help address goals and manage ongoing symptoms.   SOCIAL HISTORY:     reports that she quit smoking about 6 years ago. Her smoking use included cigarettes. She has a 100.00 pack-year smoking history. She has never used smokeless tobacco. She reports that she does not drink alcohol or use drugs.   Patient is married and lives at home with her husband.  She has two daughters, one of whom lives in [place] and the other in [place], [place].  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  ADVANCE DIRECTIVES:  Does not have  CODE STATUS:   PAST MEDICAL HISTORY: Past Medical History:  Diagnosis Date  . Arthritis   . Cirrhosis ([place])   . Depression   . GERD (gastroesophageal reflux disease)   . Hyperlipidemia   . Hypertension   . Metastatic bone cancer ([place])     PAST SURGICAL HISTORY:  Past Surgical History:  Procedure Laterality Date  . APPENDECTOMY  1971  .  CHOLECYSTECTOMY  1971  . COLONOSCOPY WITH PROPOFOL N/A 11/15/2016   Procedure: COLONOSCOPY WITH PROPOFOL;  Surgeon: Jonathon Bellows, MD;  Location: [facility] ENDOSCOPY;  Service: Gastroenterology;  Laterality: N/A;  . ENDOBRONCHIAL ULTRASOUND Right 07/30/2018   Procedure: ENDOBRONCHIAL ULTRASOUND;  Surgeon: Laverle Hobby, MD;  Location: [facility] ORS;  Service: Pulmonary;  Laterality: Right;  . ESOPHAGOGASTRODUODENOSCOPY (EGD) WITH PROPOFOL N/A 01/07/2017   Procedure: ESOPHAGOGASTRODUODENOSCOPY (EGD) WITH PROPOFOL;  Surgeon: Jonathon Bellows, MD;  Location: [facility] ENDOSCOPY;  Service: Gastroenterology;  Laterality: N/A;  . LAPAROSCOPY N/A 03/01/2017   Procedure: LAPAROSCOPY DIAGNOSTIC;  Surgeon: Robert Bellow, MD;  Location: [facility] ORS;  Service: General;  Laterality: N/A;  . PORTA CATH INSERTION N/A 08/14/2018   Procedure: PORTA CATH INSERTION;  Surgeon: Algernon Huxley, MD;  Location: [place] CV LAB;  Service: Cardiovascular;  Laterality: N/A;  . TONSILECTOMY, ADENOIDECTOMY, BILATERAL MYRINGOTOMY [place]  . TONSILLECTOMY    . VENTRAL HERNIA REPAIR N/A 03/01/2017   10 x 14 CM Ventralight ST mesh, intraperitoneal location.   . VENTRAL HERNIA REPAIR N/A 03/01/2017   Procedure: HERNIA REPAIR VENTRAL ADULT;  Surgeon: Robert Bellow, MD;  Location: [facility] ORS;  Service: General;  Laterality: N/A;    HEMATOLOGY/ONCOLOGY HISTORY:  Oncology History  Small cell lung cancer, right upper lobe ([place])  08/05/2018 Cancer Staging   Staging form: Lung, AJCC 8th Edition - Clinical stage from 08/05/2018: Stage IVB (cT3, cN2, cM1c) - Signed by Sindy Guadeloupe, MD on 08/06/2018   08/06/2018 Initial Diagnosis   Small  cell lung cancer, right upper lobe ([place])   08/18/2018 -  Chemotherapy   The patient had palonosetron (ALOXI) injection 0.25 mg, 0.25 mg, Intravenous,  Once, 1 of 4 cycles Administration: 0.25 mg (08/18/2018) pegfilgrastim (NEULASTA ONPRO KIT) injection 6 mg, 6 mg, Subcutaneous, Once, 1 of 4 cycles  Administration: 6 mg (08/20/2018) CARBOplatin (PARAPLATIN) 460 mg in sodium chloride 0.9 % 250 mL chemo infusion, 460 mg (100 % of original dose 462.5 mg), Intravenous,  Once, 1 of 4 cycles Dose modification:   (original dose 462.5 mg, Cycle 1) Administration: 460 mg (08/18/2018) etoposide (VEPESID) 190 mg in sodium chloride 0.9 % 500 mL chemo infusion, 100 mg/m2 = 190 mg, Intravenous,  Once, 1 of 4 cycles Administration: 190 mg (08/18/2018), 190 mg (08/19/2018), 190 mg (08/20/2018) fosaprepitant (EMEND) 150 mg, dexamethasone (DECADRON) 12 mg in sodium chloride 0.9 % 145 mL IVPB, , Intravenous,  Once, 1 of 4 cycles Administration:  (08/18/2018) atezolizumab (TECENTRIQ) 1,200 mg in sodium chloride 0.9 % 250 mL chemo infusion, 1,200 mg, Intravenous, Once, 1 of 8 cycles Administration: 1,200 mg (08/18/2018)  for chemotherapy treatment.      ALLERGIES:  is allergic to bee venom.  MEDICATIONS:  Current Outpatient Medications  Medication Sig Dispense Refill  . atorvastatin (LIPITOR) 10 MG tablet Take 10 mg by mouth daily.    Marland Kitchen buPROPion (WELLBUTRIN XL) 300 MG 24 hr tablet Take 1 tablet (300 mg total) by mouth daily. 90 tablet 1  . escitalopram (LEXAPRO) 10 MG tablet Take 1 tablet (10 mg total) by mouth daily. 90 tablet 1  . ibuprofen (ADVIL,MOTRIN) 200 MG tablet Take 400 mg by mouth every 8 (eight) hours as needed for headache or moderate pain.     Marland Kitchen lidocaine-prilocaine (EMLA) cream Apply to affected area once 30 g 3  . loperamide (IMODIUM) 2 MG capsule Take 1 capsule (2 mg total) by mouth every 2 (two) hours as needed for diarrhea or loose stools. Do not exceed 67m daily. 30 capsule 1  . LORazepam (ATIVAN) 0.5 MG tablet Take 1 tablet (0.5 mg total) by mouth every 6 (six) hours as needed (Nausea or vomiting). 30 tablet 0  . Multiple Vitamins-Minerals (PRESERVISION AREDS 2) CAPS Take 1 capsule by mouth 2 (two) times a day.     . ondansetron (ZOFRAN) 8 MG tablet Take 1 tablet (8 mg total) by mouth 2  (two) times daily as needed for refractory nausea / vomiting. Start on day 3 after carboplatin chemo. 30 tablet 1  . oxyCODONE (OXY IR/ROXICODONE) 5 MG immediate release tablet Take 1 tablet (5 mg total) by mouth every 6 (six) hours as needed for severe pain. 120 tablet 0  . pantoprazole (PROTONIX) 40 MG tablet TAKE 1 TABLET BY MOUTH TWICE DAILY BEFORE A MEAL 180 tablet 0  . potassium chloride SA (K-DUR) 20 MEQ tablet Take 1 tablet (20 mEq total) by mouth daily. 10 tablet 0  . prochlorperazine (COMPAZINE) 10 MG tablet Take 1 tablet (10 mg total) by mouth every 6 (six) hours as needed (Nausea or vomiting). 30 tablet 1  . rosuvastatin (CRESTOR) 20 MG tablet TAKE 1 TABLET BY MOUTH ONCE DAILY 90 tablet 3   No current facility-administered medications for this visit.     VITAL SIGNS: There were no vitals taken for this visit. There were no vitals filed for this visit.  Estimated body mass index is 28.17 kg/m as calculated from the following:   Height as of 08/14/18: 5' 5"  (1.651 m).   Weight as  of an earlier encounter on 08/28/18: 169 lb 4.8 oz (76.8 kg).  LABS: CBC:    Component Value Date/Time   WBC 0.7 (LL) 08/28/2018 1011   HGB 9.6 (L) 08/28/2018 1011   HCT 30.7 (L) 08/28/2018 1011   PLT 71 (L) 08/28/2018 1011   MCV 83.7 08/28/2018 1011   NEUTROABS 0.2 (L) 08/28/2018 1011   LYMPHSABS 0.3 (L) 08/28/2018 1011   MONOABS 0.2 08/28/2018 1011   EOSABS 0.1 08/28/2018 1011   BASOSABS 0.0 08/28/2018 1011   Comprehensive Metabolic Panel:    Component Value Date/Time   NA 137 08/28/2018 1011   K 3.6 08/28/2018 1011   CL 103 08/28/2018 1011   CO2 26 08/28/2018 1011   BUN 14 08/28/2018 1011   CREATININE 0.95 08/28/2018 1011   CREATININE 1.09 02/26/2012 0925   GLUCOSE 107 (H) 08/28/2018 1011   CALCIUM 8.8 (L) 08/28/2018 1011   AST 18 08/28/2018 1011   ALT 27 08/28/2018 1011   ALKPHOS 219 (H) 08/28/2018 1011   BILITOT 0.8 08/28/2018 1011   PROT 6.9 08/28/2018 1011   ALBUMIN 3.5  08/28/2018 1011    RADIOGRAPHIC STUDIES: Dg Chest 1 View  Result Date: 07/30/2018 CLINICAL DATA:  Metastatic cancer. Status post bronchoscopy with biopsies. EXAM: CHEST  1 VIEW COMPARISON:  PET-CT dated 07/15/2018 FINDINGS: There is no evidence of pneumothorax or pulmonary hemorrhage or other acute abnormality. No infiltrates or effusions. Mediastinal adenopathy as noted on the prior PET scan. The pulmonary nodule in the right upper lobe posterior medially is obscured by the mediastinal enlargement. No discrete metastatic disease to the skeleton. Old deformity of the posterior aspect of the left seventh rib consistent with an old fracture present on the prior PET-CT. IMPRESSION: No pneumothorax or pulmonary hemorrhage after bronchoscopy with biopsies. Mediastinal adenopathy. Electronically Signed   By: Lorriane Shire M.D.   On: 07/30/2018 15:16    PERFORMANCE STATUS (ECOG) : 1 - Symptomatic but completely ambulatory  Review of Systems Unless otherwise noted, a complete review of systems is negative.  Physical Exam General: NAD, frail appearing Pulmonary: Unlabored Extremities: no edema Skin: no rashes Neurological: Weakness but otherwise nonfocal  IMPRESSION: I met with patient today to discuss goals.  I introduced palliative care services and attempted establish therapeutic rapport.  Symptomatically, patient reports doing reasonably well.  She denies pain or other distressing symptoms.  She has had some diarrhea but feels it is slowly improving.  Patient plans to bring a stool sample in for testing.  Patient recognizes that her cancer is advanced but remains optimistic regarding her treatment plan and her chances for positive outcome.  Patient describes having good support at home.  Her husband is involved and she has 2 daughters who help when needed.  Patient reports being functionally independent.  She has some difficulty with household chores relies on her husband to help when  needed.  We discussed her emotional coping.  She describes some periods of feeling down but say they are short-lived.  She enjoys engaging in arts and crafts to help cope.  She is currently working on [employer] presents for her family.  We discussed advance care plans.  Patient is thinking about establishing a financial will.  I reviewed with her ACP documents and a MOST Form today, which she took home to review with her husband.  PLAN: -Continue current scope of treatment -Discussed ACP/MOST form -RTC in 3 weeks   Patient expressed understanding and was in agreement with this plan. She also  understands that She can call the clinic at any time with any questions, concerns, or complaints.     Time Total: 30 minutes  Visit consisted of counseling and education dealing with the complex and emotionally intense issues of symptom management and palliative care in the setting of serious and potentially life-threatening illness.Greater than 50%  of this time was spent counseling and coordinating care related to the above assessment and plan.  Signed by: Altha Harm, PhD, NP-C [phone] (Work Cell)

## 2018-08-29 LAB — T4: T4, Total: 8.4 ug/dL (ref 4.5–12.0)

## 2018-08-29 NOTE — Progress Notes (Signed)
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 She has been referred to [place] for findings of bone metastases and her recent MRI. She has a prior history of 3 packs/day day smoking for over 45 years and quit smoking 5 years ago.She had a CT chest abdomen pelvis in 2018 which showed a 5 mm lung nodule in the left lower lobe. Recently over the last 2 months patient has been having worsening back pain and was referred to orthopedics. 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 Pathologic fracture of S1.  PET scan showed 2 RUL lung nodules, hilar and mediastinal adenopathy and widespread bone mets. MRI brain negative.  Patient completed palliative RT to her spine. Bronchoscopy showed small cell lung cancer.  Palliative carboplatin, etoposide and Tecentriq started on 08/18/2018  Interval history: Reports 4-5 episodes of watery diarrhea over the last couple of days.  Reports that she has been drinking plenty of decaffeinated tea and fluids.  Denies any fatigue nausea or vomiting.  Denies any back pain at this time  ECOG PS- 1  Pain scale- 0   Review of systems- Review of Systems  Constitutional: Negative for chills, fever, malaise/fatigue and weight loss.  HENT: Negative for congestion, ear discharge and nosebleeds.   Eyes: Negative for blurred vision.  Respiratory: Negative for cough, hemoptysis, sputum production, shortness of breath and wheezing.   Cardiovascular: Negative for chest pain, palpitations, orthopnea and claudication.  Gastrointestinal: Positive for diarrhea. Negative for abdominal pain, blood in stool, constipation, heartburn, melena, nausea and vomiting.  Genitourinary: Negative for dysuria, flank pain, frequency, hematuria and urgency.  Musculoskeletal: Negative for back pain, joint pain and myalgias.  Skin: Negative for rash.  Neurological: Negative for dizziness, tingling, focal weakness, seizures, weakness and headaches.  Endo/Heme/Allergies: Does not bruise/bleed easily.  Psychiatric/Behavioral: Negative for depression and suicidal ideas. The patient does not have insomnia.       Allergies  Allergen Reactions  . Bee Venom Swelling     Past Medical History:  Diagnosis Date  . Arthritis   . Cirrhosis ([place])   . Depression   . GERD (gastroesophageal reflux disease)   . Hyperlipidemia   . Hypertension   . Metastatic bone cancer [facility])      Past Surgical History:  Procedure Laterality Date  . APPENDECTOMY  1971  . CHOLECYSTECTOMY  1971  . COLONOSCOPY WITH PROPOFOL N/A 11/15/2016   Procedure: COLONOSCOPY WITH PROPOFOL;  Surgeon: Jonathon Bellows, MD;  Location: [facility] ENDOSCOPY;  Service: Gastroenterology;  Laterality: N/A;  . ENDOBRONCHIAL ULTRASOUND Right 07/30/2018   Procedure: ENDOBRONCHIAL ULTRASOUND;  Surgeon: Laverle Hobby, MD;  Location: [facility] ORS;  Service: Pulmonary;  Laterality: Right;  . ESOPHAGOGASTRODUODENOSCOPY (EGD) WITH PROPOFOL N/A 01/07/2017  Procedure: ESOPHAGOGASTRODUODENOSCOPY (EGD) WITH PROPOFOL;  Surgeon: Jonathon Bellows, MD;  Location: [facility] ENDOSCOPY;   Service: Gastroenterology;  Laterality: N/A;  . LAPAROSCOPY N/A 03/01/2017   Procedure: LAPAROSCOPY DIAGNOSTIC;  Surgeon: Robert Bellow, MD;  Location: [facility] ORS;  Service: General;  Laterality: N/A;  . PORTA CATH INSERTION N/A 08/14/2018   Procedure: PORTA CATH INSERTION;  Surgeon: Algernon Huxley, MD;  Location: [place] [facility] CV LAB;  Service: Cardiovascular;  Laterality: N/A;  . TONSILECTOMY, ADENOIDECTOMY, BILATERAL MYRINGOTOMY [place]  . TONSILLECTOMY    . VENTRAL HERNIA REPAIR N/A 03/01/2017   10 x 14 CM Ventralight ST mesh, intraperitoneal location.   . VENTRAL HERNIA REPAIR N/A 03/01/2017   Procedure: HERNIA REPAIR VENTRAL ADULT;  Surgeon: Robert Bellow, MD;  Location: [facility] ORS;  Service: General;  Laterality: N/A;    Social History   Socioeconomic History  . Marital status: Married    Spouse name: [name]  . Number of children: Not on file  . Years of education: Not on file  . Highest education level: Not on file  Occupational History  . Not on file  Social Needs  . Financial resource strain: Not hard at all  . Food insecurity    Worry: Never true    Inability: Never true  . Transportation needs    Medical: No    Non-medical: No  Tobacco Use  . Smoking status: Former Smoker    Packs/day: 2.00    Years: 50.00    Pack years: 100.00    Types: Cigarettes    Quit date: 02/07/2012    Years since quitting: 6.5  . Smokeless tobacco: Never Used  . Tobacco comment: uses vape without nicotine, one capsule every 3 months.  Substance and Sexual Activity  . Alcohol use: No    Alcohol/week: 0.0 standard drinks  . Drug use: No  . Sexual activity: Yes  Lifestyle  . Physical activity    Days per week: Not on file    Minutes per session: Not on file  . Stress: Not at all  Relationships  . Social Herbalist on phone: Not on file    Gets together: Not on file    Attends religious service: Not on file    Active member of club or organization: Not on file     Attends meetings of clubs or organizations: Not on file    Relationship status: Not on file  . Intimate partner violence    Fear of current or ex partner: No    Emotionally abused: No    Physically abused: No    Forced sexual activity: No  Other Topics Concern  . Not on file  Social History Narrative   Married   Retired   Clinical cytogeneticist level of education    No children    1 cup of coffee    Family History  Problem Relation Age of Onset  . Hypertension Mother   . Ovarian cancer Mother 37  . Heart disease Father   . Stroke Father   . Ovarian cancer Sister        ? dx cancer had hyst.  . Breast cancer Neg Hx      Current Outpatient Medications:  .  atorvastatin (LIPITOR) 10 MG tablet, Take 10 mg by mouth daily., Disp: , Rfl:  .  buPROPion (WELLBUTRIN XL) 300 MG 24 hr tablet, Take 1 tablet (300 mg total) by mouth daily., Disp: 90 tablet, Rfl: 1 .  escitalopram (LEXAPRO) 10 MG tablet, Take 1 tablet (10 mg total) by mouth daily., Disp: 90 tablet, Rfl: 1 .  ibuprofen (ADVIL,MOTRIN) 200 MG tablet, Take 400 mg by mouth every 8 (eight) hours as needed for headache or moderate pain. , Disp: , Rfl:  .  lidocaine-prilocaine (EMLA) cream, Apply to affected area once, Disp: 30 g, Rfl: 3 .  LORazepam (ATIVAN) 0.5 MG tablet, Take 1 tablet (0.5 mg total) by mouth every 6 (six) hours as needed (Nausea or vomiting)., Disp: 30 tablet, Rfl: 0 .  Multiple Vitamins-Minerals (PRESERVISION AREDS 2) CAPS, Take 1 capsule by mouth 2 (two) times a day. , Disp: , Rfl:  .  ondansetron (ZOFRAN) 8 MG tablet, Take 1 tablet (8 mg total) by mouth 2 (two) times daily as needed for refractory nausea / vomiting. Start on day 3 after carboplatin chemo., Disp: 30 tablet, Rfl: 1 .  oxyCODONE (OXY IR/ROXICODONE) 5 MG immediate release tablet, Take 1 tablet (5 mg total) by mouth every 6 (six) hours as needed for severe pain., Disp: 120 tablet, Rfl: 0 .  pantoprazole (PROTONIX) 40 MG tablet, TAKE 1 TABLET BY MOUTH  TWICE DAILY BEFORE A MEAL, Disp: 180 tablet, Rfl: 0 .  potassium chloride SA (K-DUR) 20 MEQ tablet, Take 1 tablet (20 mEq total) by mouth daily., Disp: 10 tablet, Rfl: 0 .  prochlorperazine (COMPAZINE) 10 MG tablet, Take 1 tablet (10 mg total) by mouth every 6 (six) hours as needed (Nausea or vomiting)., Disp: 30 tablet, Rfl: 1 .  rosuvastatin (CRESTOR) 20 MG tablet, TAKE 1 TABLET BY MOUTH ONCE DAILY, Disp: 90 tablet, Rfl: 3 .  loperamide (IMODIUM) 2 MG capsule, Take 1 capsule (2 mg total) by mouth every 2 (two) hours as needed for diarrhea or loose stools. Do not exceed 16mg  daily., Disp: 30 capsule, Rfl: 1  Physical exam:  Vitals:   08/28/18 1025  BP: (!) 110/52  Pulse: 93  Resp: 14  Temp: (!) 97.4 F (36.3 C)  TempSrc: Tympanic  Weight: 169 lb 4.8 oz (76.8 kg)   Physical Exam Constitutional:      General: She is not in acute distress. HENT:     Head: Normocephalic and atraumatic.  Eyes:     Pupils: Pupils are equal, round, and reactive to light.  Neck:     Musculoskeletal: Normal range of motion.  Cardiovascular:     Rate and Rhythm: Normal rate and regular rhythm.     Heart sounds: Normal heart sounds.  Pulmonary:     Effort: Pulmonary effort is normal.     Breath sounds: Normal breath sounds.  Abdominal:     General: Bowel sounds are normal.     Palpations: Abdomen is soft.  Skin:    General: Skin is warm and dry.  Neurological:     Mental Status: She is alert and oriented to person, place, and time.      CMP Latest Ref Rng & Units 08/28/2018  Glucose 70 - 99 mg/dL 107(H)  BUN 8 - 23 mg/dL 14  Creatinine 0.44 - 1.00 mg/dL 0.95  Sodium 135 - 145 mmol/L 137  Potassium 3.5 - 5.1 mmol/L 3.6  Chloride 98 - 111 mmol/L 103  CO2 22 - 32 mmol/L 26  Calcium 8.9 - 10.3 mg/dL 8.8(L)  Total Protein 6.5 - 8.1 g/dL 6.9  Total Bilirubin 0.3 - 1.2 mg/dL 0.8  Alkaline Phos 38 - 126 U/L 219(H)  AST 15 - 41 U/L 18  ALT 0 - 44 U/L 27  CBC Latest Ref Rng & Units 08/28/2018   WBC 4.0 - 10.5 K/uL 0.7(LL)  Hemoglobin 12.0 - 15.0 g/dL 9.6(L)  Hematocrit 36.0 - 46.0 % 30.7(L)  Platelets 150 - 400 K/uL 71(L)    No images are attached to the encounter.  Dg Chest 1 View  Result Date: 07/30/2018 CLINICAL DATA:  Metastatic cancer. Status post bronchoscopy with biopsies. EXAM: CHEST  1 VIEW COMPARISON:  PET-CT dated 07/15/2018 FINDINGS: There is no evidence of pneumothorax or pulmonary hemorrhage or other acute abnormality. No infiltrates or effusions. Mediastinal adenopathy as noted on the prior PET scan. The pulmonary nodule in the right upper lobe posterior medially is obscured by the mediastinal enlargement. No discrete metastatic disease to the skeleton. Old deformity of the posterior aspect of the left seventh rib consistent with an old fracture present on the prior PET-CT. IMPRESSION: No pneumothorax or pulmonary hemorrhage after bronchoscopy with biopsies. Mediastinal adenopathy. Electronically Signed   By: Lorriane Shire M.D.   On: 07/30/2018 15:16     Assessment and plan- Patient is a 68 y.o. 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  She is here for toxicity check status post 1 cycle of carboplatin etoposide and Tecentriq  Patient is roughly 10 days post chemotherapy.  She did receive Neulasta.  Her white count is 0.7 today with an ANC of 0.2 which is not unexpected [place] at this time and likely to improve over the next several days.  Thrombocytopenia also likely secondary to chemotherapy and likely to improve.  Continue to monitor  Overall patient seems to have tolerated chemotherapy well without any significant side effects.  We will hold off on giving her any IV fluids at this time.  She knows to call if she has any questions or concerns  Diarrhea: I will check stool for C. difficile and stool intestinal panel PCR  I will see him back in 10 days time with CBC with differential, CMP for cycle 2 of carbo  etoposide and Tecentriq  Will discuss bisphosphonate use at next visit   Visit Diagnosis 1. Diarrhea, unspecified type   2. Chemotherapy induced neutropenia (HCC)   3. Small cell lung cancer, right upper lobe ([place])      Dr. Randa Evens, MD, MPH [facility] [phone] 08/29/2018 10:01 AM

## 2018-09-01 ENCOUNTER — Other Ambulatory Visit: Payer: Self-pay | Admitting: Family Medicine

## 2018-09-05 ENCOUNTER — Ambulatory Visit (INDEPENDENT_AMBULATORY_CARE_PROVIDER_SITE_OTHER): Payer: PPO

## 2018-09-05 ENCOUNTER — Encounter: Payer: Self-pay | Admitting: Family Medicine

## 2018-09-05 ENCOUNTER — Other Ambulatory Visit: Payer: Self-pay

## 2018-09-05 ENCOUNTER — Ambulatory Visit (INDEPENDENT_AMBULATORY_CARE_PROVIDER_SITE_OTHER): Payer: PPO | Admitting: Family Medicine

## 2018-09-05 DIAGNOSIS — Z0000 Encounter for general adult medical examination without abnormal findings: Secondary | ICD-10-CM | POA: Diagnosis not present

## 2018-09-05 DIAGNOSIS — F331 Major depressive disorder, recurrent, moderate: Secondary | ICD-10-CM | POA: Diagnosis not present

## 2018-09-05 DIAGNOSIS — E785 Hyperlipidemia, unspecified: Secondary | ICD-10-CM | POA: Diagnosis not present

## 2018-09-05 DIAGNOSIS — C3411 Malignant neoplasm of upper lobe, right bronchus or lung: Secondary | ICD-10-CM

## 2018-09-05 NOTE — Progress Notes (Signed)
Subjective:   Anne [name] is a 67 y.o. female who presents for Medicare Annual (Subsequent) preventive examination.  Review of Systems:  No ROS.  Medicare Wellness Virtual Visit.  Visual/audio telehealth visit, UTA vital signs.   See social history for additional risk factors.   Cardiac Risk Factors include: advanced age (>70men, >70 women);hypertension     Objective:     Vitals: There were no vitals taken for this visit.  There is no height or weight on file to calculate BMI.  Advanced Directives 09/05/2018 08/28/2018 08/18/2018 08/14/2018 08/05/2018 07/30/2018 07/25/2018  Does Patient Have a Medical Advance Directive? No No No No No No No  Would patient like information on creating a medical advance directive? No - Patient declined - - No - Patient declined - No - Patient declined No - Patient declined    Tobacco Social History   Tobacco Use  Smoking Status Former Smoker  . Packs/day: 2.00  . Years: 50.00  . Pack years: 100.00  . Types: Cigarettes  . Quit date: 02/07/2012  . Years since quitting: 6.5  Smokeless Tobacco Never Used  Tobacco Comment   uses vape without nicotine, one capsule every 3 months.     Counseling given: Not Answered Comment: uses vape without nicotine, one capsule every 3 months.   Clinical Intake:  Pre-visit preparation completed: Yes        Diabetes: No  How often do you need to have someone help you when you read instructions, pamphlets, or other written materials from your doctor or pharmacy?: 1 - Never  Interpreter Needed?: No     Past Medical History:  Diagnosis Date  . Arthritis   . Cirrhosis ([place])   . Depression   . GERD (gastroesophageal reflux disease)   . Hyperlipidemia   . Hypertension   . Metastatic bone cancer [facility])    Past Surgical History:  Procedure Laterality Date  . APPENDECTOMY  1971  . CHOLECYSTECTOMY  1971  . COLONOSCOPY WITH PROPOFOL N/A 11/15/2016   Procedure: COLONOSCOPY WITH PROPOFOL;  Surgeon:  Jonathon Bellows, MD;  Location: [facility] ENDOSCOPY;  Service: Gastroenterology;  Laterality: N/A;  . ENDOBRONCHIAL ULTRASOUND Right 07/30/2018   Procedure: ENDOBRONCHIAL ULTRASOUND;  Surgeon: Laverle Hobby, MD;  Location: [facility] ORS;  Service: Pulmonary;  Laterality: Right;  . ESOPHAGOGASTRODUODENOSCOPY (EGD) WITH PROPOFOL N/A 01/07/2017   Procedure: ESOPHAGOGASTRODUODENOSCOPY (EGD) WITH PROPOFOL;  Surgeon: Jonathon Bellows, MD;  Location: [facility] ENDOSCOPY;  Service: Gastroenterology;  Laterality: N/A;  . LAPAROSCOPY N/A 03/01/2017   Procedure: LAPAROSCOPY DIAGNOSTIC;  Surgeon: Robert Bellow, MD;  Location: [facility] ORS;  Service: General;  Laterality: N/A;  . PORTA CATH INSERTION N/A 08/14/2018   Procedure: PORTA CATH INSERTION;  Surgeon: Algernon Huxley, MD;  Location: [place] CV LAB;  Service: Cardiovascular;  Laterality: N/A;  . TONSILECTOMY, ADENOIDECTOMY, BILATERAL MYRINGOTOMY [place]  . TONSILLECTOMY    . VENTRAL HERNIA REPAIR N/A 03/01/2017   10 x 14 CM Ventralight ST mesh, intraperitoneal location.   . VENTRAL HERNIA REPAIR N/A 03/01/2017   Procedure: HERNIA REPAIR VENTRAL ADULT;  Surgeon: Robert Bellow, MD;  Location: [facility] ORS;  Service: General;  Laterality: N/A;   Family History  Problem Relation Age of Onset  . Hypertension Mother   . Ovarian cancer Mother 65  . Heart disease Father   . Stroke Father   . Ovarian cancer Sister        ? dx cancer had hyst.  . Breast cancer Neg Hx  Social History   Socioeconomic History  . Marital status: Married    Spouse name: [name]  . Number of children: Not on file  . Years of education: Not on file  . Highest education level: Not on file  Occupational History  . Not on file  Social Needs  . Financial resource strain: Not hard at all  . Food insecurity    Worry: Never true    Inability: Never true  . Transportation needs    Medical: No    Non-medical: No  Tobacco Use  . Smoking status: Former Smoker    Packs/day: 2.00     Years: 50.00    Pack years: 100.00    Types: Cigarettes    Quit date: 02/07/2012    Years since quitting: 6.5  . Smokeless tobacco: Never Used  . Tobacco comment: uses vape without nicotine, one capsule every 3 months.  Substance and Sexual Activity  . Alcohol use: No    Alcohol/week: 0.0 standard drinks  . Drug use: No  . Sexual activity: Yes  Lifestyle  . Physical activity    Days per week: Not on file    Minutes per session: Not on file  . Stress: Not at all  Relationships  . Social Herbalist on phone: Not on file    Gets together: Not on file    Attends religious service: Not on file    Active member of club or organization: Not on file    Attends meetings of clubs or organizations: Not on file    Relationship status: Not on file  Other Topics Concern  . Not on file  Social History Narrative   Married   Retired   Clinical cytogeneticist level of education    No children    1 cup of coffee    Outpatient Encounter Medications as of 09/05/2018  Medication Sig  . buPROPion (WELLBUTRIN XL) 300 MG 24 hr tablet Take 1 tablet by mouth once daily  . escitalopram (LEXAPRO) 10 MG tablet Take 1 tablet (10 mg total) by mouth daily.  Marland Kitchen ibuprofen (ADVIL,MOTRIN) 200 MG tablet Take 400 mg by mouth every 8 (eight) hours as needed for headache or moderate pain.   Marland Kitchen lidocaine-prilocaine (EMLA) cream Apply to affected area once  . loperamide (IMODIUM) 2 MG capsule Take 1 capsule (2 mg total) by mouth every 2 (two) hours as needed for diarrhea or loose stools. Do not exceed 16mg  daily.  Marland Kitchen LORazepam (ATIVAN) 0.5 MG tablet Take 1 tablet (0.5 mg total) by mouth every 6 (six) hours as needed (Nausea or vomiting).  . Multiple Vitamins-Minerals (PRESERVISION AREDS 2) CAPS Take 1 capsule by mouth 2 (two) times a day.   . ondansetron (ZOFRAN) 8 MG tablet Take 1 tablet (8 mg total) by mouth 2 (two) times daily as needed for refractory nausea / vomiting. Start on day 3 after carboplatin chemo.   Marland Kitchen oxyCODONE (OXY IR/ROXICODONE) 5 MG immediate release tablet Take 1 tablet (5 mg total) by mouth every 6 (six) hours as needed for severe pain.  . pantoprazole (PROTONIX) 40 MG tablet TAKE 1 TABLET BY MOUTH TWICE DAILY BEFORE A MEAL  . potassium chloride SA (K-DUR) 20 MEQ tablet Take 1 tablet (20 mEq total) by mouth daily.  . prochlorperazine (COMPAZINE) 10 MG tablet Take 1 tablet (10 mg total) by mouth every 6 (six) hours as needed (Nausea or vomiting).  . rosuvastatin (CRESTOR) 20 MG tablet TAKE 1 TABLET BY MOUTH ONCE DAILY  No facility-administered encounter medications on file as of 09/05/2018.     Activities of Daily Living In your present state of health, do you have any difficulty performing the following activities: 09/05/2018 08/14/2018  Hearing? Y N  Comment Hearing aids -  Vision? N N  Difficulty concentrating or making decisions? N N  Walking or climbing stairs? Y N  Comment Unsteady gait. Walker in use. -  Dressing or bathing? N N  Doing errands, shopping? N -  Preparing Food and eating ? N -  Using the Toilet? N -  In the past six months, have you accidently leaked urine? N -  Do you have problems with loss of bowel control? N -  Managing your Medications? N -  Managing your Finances? N -  Housekeeping or managing your Housekeeping? Y -  Comment Husband assists as needed -  Some recent data might be hidden    Patient Care Team: Leone Haven, MD as PCP - General (Family Medicine) Caryl Bis, Angela Adam, MD as Consulting Physician (Family Medicine) Byrnett, Forest Gleason, MD (General Surgery) Telford Nab, RN as Registered Nurse    Assessment:   This is a routine wellness examination for [place].  I connected with patient 09/05/18 at 12:30 PM EDT by an audio enabled telemedicine application and verified that I am speaking with the correct person using two identifiers. Patient stated full name and DOB. Patient gave permission to continue with virtual visit. Patient's  location was at home and Nurse's location was at [place] office.   Health Screenings  Mammogram -03/2015. Discussed. Agreeable if directed by pcp.  Colonoscopy - 11/2016 Bone Density - 03/2015 Glaucoma -none Hearing - hearing aids Hepatitis C screening- 01/2017 Labs followed by pcp Dental- dentures Vision- visits within the last 12 months. Wears glasses.  Social  Alcohol intake - no          Smoking history- never   former   current Smokers in home? none Illicit drug use? none Exercise - no routine Diet - regular Sexually Active -not currently BMI- discussed the importance of a healthy diet, water intake and the benefits of aerobic exercise.  Educational material provided.   Safety  Patient feels safe at home- yes Patient does have smoke detectors at home- yes Patient does wear sunscreen or protective clothing when in direct sunlight -yes Patient does wear seat belt when in a moving vehicle -yes Patient drives- yes  [id number] precautions and sickness symptoms discussed.   Activities of Daily Living Patient denies needing assistance with: driving, household chores, feeding themselves, getting from bed to chair, getting to the toilet, bathing/showering, dressing, managing money, or preparing meals.  No new identified risk were noted.    Depression Screen Declined discussion. Discussed with her doctor in visit today.   Medication-taking as directed and without issues.   Fall Screen Patient denies being afraid of falling or falling in the last year. Ambulates with walker.   Memory Screen Patient is alert.  Correctly identified the president of the [place], season and recall. Patient likes to make crafts for brain stimulation.  Immunizations The following Immunizations were discussed: Influenza, shingles, pneumonia, and tetanus.   Other Providers Patient Care Team: Leone Haven, MD as PCP - General (Family Medicine) Leone Haven, MD as Consulting Physician  (Family Medicine) Bary Castilla, Forest Gleason, MD (General Surgery) Telford Nab, RN as Registered Nurse  Exercise Activities and Dietary recommendations Current Exercise Habits: The patient does not participate in regular exercise at present  Goals      Patient Stated   . DIET - Healthy diet (pt-stated)     Add Ensure/Boost supplemental drink as a meal or snack on days you are not hungry instead of skipping meals.       Fall Risk Fall Risk  09/05/2018 02/26/2018 11/20/2017 06/04/2017 06/01/2016  Falls in the past year? 0 0 No No No  Number falls in past yr: 0 0 - - -  Injury with Fall? - 0 - - -   Is the patient's home free of loose throw rugs in walkways, pet beds, electrical cords, etc? yes      Grab bars in the bathroom? yes      Handrails on the stairs? yes      Adequate lighting? yes  Depression Screen PHQ 2/9 Scores 09/05/2018 02/26/2018 02/26/2018 11/20/2017  PHQ - 2 Score 0 2 2 1   PHQ- 9 Score - 6 - -     Cognitive Function MMSE - Mini Mental State Exam 06/04/2017  Orientation to time 5  Orientation to Place 5  Registration 3  Attention/ Calculation 5  Recall 0  Language- name 2 objects 2  Language- repeat 1  Language- follow 3 step command 3  Language- read & follow direction 1  Write a sentence 1  Copy design 1  Total score 27     6CIT Screen 09/05/2018 06/01/2016  What Year? 0 points 0 points  What month? 0 points 0 points  What time? 0 points 0 points  Count back from 20 0 points 0 points  Months in reverse 0 points 0 points  Repeat phrase - 0 points  Total Score - 0    Immunization History  Administered Date(s) Administered  . Influenza, High Dose Seasonal PF 09/22/2017  . Influenza-Unspecified 10/15/2014, 10/29/2015, 11/01/2016  . Pneumococcal Conjugate-13 11/13/2013  . Pneumococcal Polysaccharide-23 11/30/2014  . Td 12/06/2017  . Zoster 11/15/2014   Screening Tests Health Maintenance  Topic Date Due  . MAMMOGRAM  03/08/2017  . INFLUENZA VACCINE   09/06/2018  . COLONOSCOPY  11/15/2021  . TETANUS/TDAP  12/07/2027  . DEXA SCAN  Completed  . Hepatitis C Screening  Completed  . PNA vac Low Risk Adult  Completed      Plan:   End of life planning; Advanced aging; Advanced directives discussed.  No HCPOA/Living Will.  Additional information declined at this time.  I have personally reviewed and noted the following in the patient's chart:   . Medical and social history . Use of alcohol, tobacco or illicit drugs  . Current medications and supplements . Functional ability and status . Nutritional status . Physical activity . Advanced directives . List of other physicians . Hospitalizations, surgeries, and ER visits in previous 12 months . Vitals . Screenings to include cognitive, depression, and falls . Referrals and appointments  In addition, I have reviewed and discussed with patient certain preventive protocols, quality metrics, and best practice recommendations. A written personalized care plan for preventive services as well as general preventive health recommendations were provided to patient.     Varney Biles, LPN  7/68/1157

## 2018-09-05 NOTE — Assessment & Plan Note (Signed)
Asymptomatic.  Continue current regimen.

## 2018-09-05 NOTE — Assessment & Plan Note (Signed)
Continue Crestor 

## 2018-09-05 NOTE — Patient Instructions (Addendum)
  Ms. Juste , Thank you for taking time to come for your Medicare Wellness Visit. I appreciate your ongoing commitment to your health goals. Please review the following plan we discussed and let me know if I can assist you in the future.   These are the goals we discussed: Goals      Patient Stated   . DIET - Healthy diet (pt-stated)     Add Ensure/Boost supplemental drink as a meal or snack on days you are not hungry instead of skipping meals.       This is a list of the screening recommended for you and due dates:  Health Maintenance  Topic Date Due  . Mammogram  03/08/2017  . Flu Shot  09/06/2018  . Colon Cancer Screening  11/15/2021  . Tetanus Vaccine  12/07/2027  . DEXA scan (bone density measurement)  Completed  .  Hepatitis C: One time screening is recommended by Center for Disease Control  (CDC) for  adults born from [birth year] through 1967.   Completed  . Pneumonia vaccines  Completed

## 2018-09-05 NOTE — Assessment & Plan Note (Signed)
The patient is doing quite well with regards to this and her chemotherapy.  She will continue to see oncology.

## 2018-09-05 NOTE — Progress Notes (Signed)
Virtual Visit via telephone Note  This visit type was conducted due to national recommendations for restrictions regarding the COVID-19 pandemic (e.g. social distancing).  This format is felt to be most appropriate for this patient at this time.  All issues noted in this document were discussed and addressed.  No physical exam was performed (except for noted visual exam findings with Video Visits).   I connected with Anne [name] today at 11:30 AM EDT by telephone and verified that I am speaking with the correct person using two identifiers. Location patient: home Location provider: work or home office Persons participating in the virtual visit: patient, provider  I discussed the limitations, risks, security and privacy concerns of performing an evaluation and management service by telephone and the availability of in person appointments. I also discussed with the patient that there may be a patient responsible charge related to this service. The patient expressed understanding and agreed to proceed.  Interactive audio and video telecommunications were attempted between this provider and patient, however failed, due to patient having technical difficulties OR patient did not have access to video capability.  We continued and completed visit with audio only.   Reason for visit: follow-up   HPI: HYPERLIPIDEMIA Symptoms Chest pain on exertion:  no   Dyspnea:  no Medications: Compliance- taking crestor Right upper quadrant pain- no  Muscle aches- no  Small cell lung cancer: Patient reports she is doing quite well after starting chemotherapy.  Her back pain has resolved completely after starting chemo.  She notes the radiation did not help much.  She has had no sickness related to the chemo.  She has had diarrhea off and on.  Oncology ordered a C. difficile stool studies and GI PCR panel though she has not completed these yet.  She denies blood in her stool or abdominal pain.  No nausea or  vomiting.  Depression: She denies depression.  She is taking Lexapro and Wellbutrin.  These have been beneficial.   ROS: See pertinent positives and negatives per HPI.  Past Medical History:  Diagnosis Date  . Arthritis   . Cirrhosis ([place])   . Depression   . GERD (gastroesophageal reflux disease)   . Hyperlipidemia   . Hypertension   . Metastatic bone cancer [facility])     Past Surgical History:  Procedure Laterality Date  . APPENDECTOMY  1971  . CHOLECYSTECTOMY  1971  . COLONOSCOPY WITH PROPOFOL N/A 11/15/2016   Procedure: COLONOSCOPY WITH PROPOFOL;  Surgeon: Jonathon Bellows, MD;  Location: [facility] ENDOSCOPY;  Service: Gastroenterology;  Laterality: N/A;  . ENDOBRONCHIAL ULTRASOUND Right 07/30/2018   Procedure: ENDOBRONCHIAL ULTRASOUND;  Surgeon: Laverle Hobby, MD;  Location: [facility] ORS;  Service: Pulmonary;  Laterality: Right;  . ESOPHAGOGASTRODUODENOSCOPY (EGD) WITH PROPOFOL N/A 01/07/2017   Procedure: ESOPHAGOGASTRODUODENOSCOPY (EGD) WITH PROPOFOL;  Surgeon: Jonathon Bellows, MD;  Location: [facility] ENDOSCOPY;  Service: Gastroenterology;  Laterality: N/A;  . LAPAROSCOPY N/A 03/01/2017   Procedure: LAPAROSCOPY DIAGNOSTIC;  Surgeon: Robert Bellow, MD;  Location: [facility] ORS;  Service: General;  Laterality: N/A;  . PORTA CATH INSERTION N/A 08/14/2018   Procedure: PORTA CATH INSERTION;  Surgeon: Algernon Huxley, MD;  Location: [place] CV LAB;  Service: Cardiovascular;  Laterality: N/A;  . TONSILECTOMY, ADENOIDECTOMY, BILATERAL MYRINGOTOMY [place]  . TONSILLECTOMY    . VENTRAL HERNIA REPAIR N/A 03/01/2017   10 x 14 CM Ventralight ST mesh, intraperitoneal location.   . VENTRAL HERNIA REPAIR N/A 03/01/2017   Procedure: HERNIA REPAIR VENTRAL  ADULT;  Surgeon: Robert Bellow, MD;  Location: [facility] ORS;  Service: General;  Laterality: N/A;    Family History  Problem Relation Age of Onset  . Hypertension Mother   . Ovarian cancer Mother 1  . Heart disease Father   . Stroke Father   .  Ovarian cancer Sister        ? dx cancer had hyst.  . Breast cancer Neg Hx     SOCIAL HX: Former smoker   Current Outpatient Medications:  .  buPROPion (WELLBUTRIN XL) 300 MG 24 hr tablet, Take 1 tablet by mouth once daily, Disp: 90 tablet, Rfl: 0 .  escitalopram (LEXAPRO) 10 MG tablet, Take 1 tablet (10 mg total) by mouth daily., Disp: 90 tablet, Rfl: 1 .  ibuprofen (ADVIL,MOTRIN) 200 MG tablet, Take 400 mg by mouth every 8 (eight) hours as needed for headache or moderate pain. , Disp: , Rfl:  .  lidocaine-prilocaine (EMLA) cream, Apply to affected area once, Disp: 30 g, Rfl: 3 .  loperamide (IMODIUM) 2 MG capsule, Take 1 capsule (2 mg total) by mouth every 2 (two) hours as needed for diarrhea or loose stools. Do not exceed 16mg  daily., Disp: 30 capsule, Rfl: 1 .  LORazepam (ATIVAN) 0.5 MG tablet, Take 1 tablet (0.5 mg total) by mouth every 6 (six) hours as needed (Nausea or vomiting)., Disp: 30 tablet, Rfl: 0 .  Multiple Vitamins-Minerals (PRESERVISION AREDS 2) CAPS, Take 1 capsule by mouth 2 (two) times a day. , Disp: , Rfl:  .  ondansetron (ZOFRAN) 8 MG tablet, Take 1 tablet (8 mg total) by mouth 2 (two) times daily as needed for refractory nausea / vomiting. Start on day 3 after carboplatin chemo., Disp: 30 tablet, Rfl: 1 .  oxyCODONE (OXY IR/ROXICODONE) 5 MG immediate release tablet, Take 1 tablet (5 mg total) by mouth every 6 (six) hours as needed for severe pain., Disp: 120 tablet, Rfl: 0 .  pantoprazole (PROTONIX) 40 MG tablet, TAKE 1 TABLET BY MOUTH TWICE DAILY BEFORE A MEAL, Disp: 180 tablet, Rfl: 0 .  potassium chloride SA (K-DUR) 20 MEQ tablet, Take 1 tablet (20 mEq total) by mouth daily., Disp: 10 tablet, Rfl: 0 .  prochlorperazine (COMPAZINE) 10 MG tablet, Take 1 tablet (10 mg total) by mouth every 6 (six) hours as needed (Nausea or vomiting)., Disp: 30 tablet, Rfl: 1 .  rosuvastatin (CRESTOR) 20 MG tablet, TAKE 1 TABLET BY MOUTH ONCE DAILY, Disp: 90 tablet, Rfl: 3  EXAM: This  was a telehealth telephone visit notes no physical exam was completed.  ASSESSMENT AND PLAN:  Discussed the following assessment and plan:  Small cell lung cancer, right upper lobe [facility]) The patient is doing quite well with regards to this and her chemotherapy.  She will continue to see oncology.  Depression Asymptomatic.  Continue current regimen.  HLD (hyperlipidemia) Continue Crestor.    Social distancing precautions and sick precautions given regarding COVID-19.  I discussed the assessment and treatment plan with the patient. The patient was provided an opportunity to ask questions and all were answered. The patient agreed with the plan and demonstrated an understanding of the instructions.   The patient was advised to call back or seek an in-person evaluation if the symptoms worsen or if the condition fails to improve as anticipated.  I provided 9 minutes of non-face-to-face time during this encounter.   Tommi Rumps, MD

## 2018-09-07 NOTE — Progress Notes (Signed)
I have reviewed the above note and agree. Message sent to patients oncologist to see if mammogram is indicated given extensive stage lung cancer.   Tommi Rumps, M.D.

## 2018-09-08 ENCOUNTER — Other Ambulatory Visit: Payer: Self-pay

## 2018-09-09 ENCOUNTER — Inpatient Hospital Stay: Payer: PPO

## 2018-09-09 ENCOUNTER — Inpatient Hospital Stay: Payer: PPO | Attending: Oncology

## 2018-09-09 ENCOUNTER — Inpatient Hospital Stay: Payer: PPO | Admitting: Nurse Practitioner

## 2018-09-09 ENCOUNTER — Other Ambulatory Visit: Payer: Self-pay

## 2018-09-09 ENCOUNTER — Inpatient Hospital Stay (HOSPITAL_BASED_OUTPATIENT_CLINIC_OR_DEPARTMENT_OTHER): Payer: PPO | Admitting: Oncology

## 2018-09-09 ENCOUNTER — Encounter: Payer: Self-pay | Admitting: Oncology

## 2018-09-09 VITALS — BP 131/80 | HR 96 | Temp 99.8°F | Resp 18 | Wt 171.8 lb

## 2018-09-09 DIAGNOSIS — C3411 Malignant neoplasm of upper lobe, right bronchus or lung: Secondary | ICD-10-CM

## 2018-09-09 DIAGNOSIS — K219 Gastro-esophageal reflux disease without esophagitis: Secondary | ICD-10-CM | POA: Diagnosis not present

## 2018-09-09 DIAGNOSIS — R748 Abnormal levels of other serum enzymes: Secondary | ICD-10-CM | POA: Insufficient documentation

## 2018-09-09 DIAGNOSIS — D6481 Anemia due to antineoplastic chemotherapy: Secondary | ICD-10-CM | POA: Diagnosis not present

## 2018-09-09 DIAGNOSIS — C7951 Secondary malignant neoplasm of bone: Secondary | ICD-10-CM | POA: Insufficient documentation

## 2018-09-09 DIAGNOSIS — Z5112 Encounter for antineoplastic immunotherapy: Secondary | ICD-10-CM | POA: Insufficient documentation

## 2018-09-09 DIAGNOSIS — Z87891 Personal history of nicotine dependence: Secondary | ICD-10-CM | POA: Insufficient documentation

## 2018-09-09 DIAGNOSIS — Z5111 Encounter for antineoplastic chemotherapy: Secondary | ICD-10-CM | POA: Insufficient documentation

## 2018-09-09 DIAGNOSIS — M549 Dorsalgia, unspecified: Secondary | ICD-10-CM | POA: Diagnosis not present

## 2018-09-09 DIAGNOSIS — Z79899 Other long term (current) drug therapy: Secondary | ICD-10-CM | POA: Diagnosis not present

## 2018-09-09 DIAGNOSIS — K7460 Unspecified cirrhosis of liver: Secondary | ICD-10-CM | POA: Diagnosis not present

## 2018-09-09 DIAGNOSIS — Z791 Long term (current) use of non-steroidal anti-inflammatories (NSAID): Secondary | ICD-10-CM | POA: Diagnosis not present

## 2018-09-09 DIAGNOSIS — Z95828 Presence of other vascular implants and grafts: Secondary | ICD-10-CM

## 2018-09-09 DIAGNOSIS — E785 Hyperlipidemia, unspecified: Secondary | ICD-10-CM | POA: Diagnosis not present

## 2018-09-09 DIAGNOSIS — I10 Essential (primary) hypertension: Secondary | ICD-10-CM | POA: Diagnosis not present

## 2018-09-09 DIAGNOSIS — Z515 Encounter for palliative care: Secondary | ICD-10-CM | POA: Diagnosis not present

## 2018-09-09 DIAGNOSIS — Z5189 Encounter for other specified aftercare: Secondary | ICD-10-CM | POA: Insufficient documentation

## 2018-09-09 DIAGNOSIS — T451X5A Adverse effect of antineoplastic and immunosuppressive drugs, initial encounter: Secondary | ICD-10-CM | POA: Diagnosis not present

## 2018-09-09 LAB — COMPREHENSIVE METABOLIC PANEL
ALT: 14 U/L (ref 0–44)
AST: 20 U/L (ref 15–41)
Albumin: 3.3 g/dL — ABNORMAL LOW (ref 3.5–5.0)
Alkaline Phosphatase: 418 U/L — ABNORMAL HIGH (ref 38–126)
Anion gap: 8 (ref 5–15)
BUN: 13 mg/dL (ref 8–23)
CO2: 27 mmol/L (ref 22–32)
Calcium: 8.2 mg/dL — ABNORMAL LOW (ref 8.9–10.3)
Chloride: 107 mmol/L (ref 98–111)
Creatinine, Ser: 0.79 mg/dL (ref 0.44–1.00)
GFR calc Af Amer: >60 mL/min (ref >60)
GFR calc non Af Amer: >60 mL/min (ref >60)
Glucose, Bld: 102 mg/dL — ABNORMAL HIGH (ref 70–99)
Potassium: 3.9 mmol/L (ref 3.5–5.1)
Sodium: 142 mmol/L (ref 135–145)
Total Bilirubin: 0.4 mg/dL (ref 0.3–1.2)
Total Protein: 6.5 g/dL (ref 6.5–8.1)

## 2018-09-09 LAB — CBC WITH DIFFERENTIAL/PLATELET
Abs Immature Granulocytes: 0.55 10*3/uL — ABNORMAL HIGH (ref 0.00–0.07)
Basophils Absolute: 0.1 10*3/uL (ref 0.0–0.1)
Basophils Relative: 1 %
Eosinophils Absolute: 0 10*3/uL (ref 0.0–0.5)
Eosinophils Relative: 0 %
HCT: 30.7 % — ABNORMAL LOW (ref 36.0–46.0)
Hemoglobin: 9.5 g/dL — ABNORMAL LOW (ref 12.0–15.0)
Immature Granulocytes: 6 %
Lymphocytes Relative: 7 %
Lymphs Abs: 0.7 10*3/uL (ref 0.7–4.0)
MCH: 26.5 pg (ref 26.0–34.0)
MCHC: 30.9 g/dL (ref 30.0–36.0)
MCV: 85.5 fL (ref 80.0–100.0)
Monocytes Absolute: 0.5 10*3/uL (ref 0.1–1.0)
Monocytes Relative: 5 %
Neutro Abs: 7.7 10*3/uL (ref 1.7–7.7)
Neutrophils Relative %: 81 %
Platelets: 240 10*3/uL (ref 150–400)
RBC: 3.59 MIL/uL — ABNORMAL LOW (ref 3.87–5.11)
RDW: 18.6 % — ABNORMAL HIGH (ref 11.5–15.5)
WBC: 9.6 10*3/uL (ref 4.0–10.5)
nRBC: 0.3 % — ABNORMAL HIGH (ref 0.0–0.2)

## 2018-09-09 MED ORDER — SODIUM CHLORIDE 0.9% FLUSH
10.0000 mL | Freq: Once | INTRAVENOUS | Status: AC
  Administered 2018-09-09: 10 mL via INTRAVENOUS
  Filled 2018-09-09: qty 10

## 2018-09-09 MED ORDER — SODIUM CHLORIDE 0.9 % IV SOLN
100.0000 mg/m2 | Freq: Once | INTRAVENOUS | Status: AC
  Administered 2018-09-09: 190 mg via INTRAVENOUS
  Filled 2018-09-09: qty 9.5

## 2018-09-09 MED ORDER — SODIUM CHLORIDE 0.9 % IV SOLN
462.5000 mg | Freq: Once | INTRAVENOUS | Status: AC
  Administered 2018-09-09: 460 mg via INTRAVENOUS
  Filled 2018-09-09: qty 46

## 2018-09-09 MED ORDER — SODIUM CHLORIDE 0.9 % IV SOLN
1200.0000 mg | Freq: Once | INTRAVENOUS | Status: AC
  Administered 2018-09-09: 1200 mg via INTRAVENOUS
  Filled 2018-09-09: qty 20

## 2018-09-09 MED ORDER — SODIUM CHLORIDE 0.9 % IV SOLN
Freq: Once | INTRAVENOUS | Status: AC
  Administered 2018-09-09: 09:00:00 via INTRAVENOUS
  Filled 2018-09-09: qty 250

## 2018-09-09 MED ORDER — PEGFILGRASTIM 6 MG/0.6ML ~~LOC~~ PSKT: 6.0000 mg | PREFILLED_SYRINGE | Freq: Once | SUBCUTANEOUS | Status: DC

## 2018-09-09 MED ORDER — PALONOSETRON HCL INJECTION 0.25 MG/5ML
0.2500 mg | Freq: Once | INTRAVENOUS | Status: AC
  Administered 2018-09-09: 0.25 mg via INTRAVENOUS
  Filled 2018-09-09: qty 5

## 2018-09-09 MED ORDER — SODIUM CHLORIDE 0.9 % IV SOLN
1200.0000 mg | Freq: Once | INTRAVENOUS | Status: DC
  Filled 2018-09-09: qty 20

## 2018-09-09 MED ORDER — HEPARIN SOD (PORK) LOCK FLUSH 100 UNIT/ML IV SOLN
500.0000 [IU] | Freq: Once | INTRAVENOUS | Status: AC | PRN
  Administered 2018-09-09: 500 [IU]
  Filled 2018-09-09: qty 5

## 2018-09-09 MED ORDER — SODIUM CHLORIDE 0.9 % IV SOLN
Freq: Once | INTRAVENOUS | Status: AC
  Administered 2018-09-09: 09:00:00 via INTRAVENOUS
  Filled 2018-09-09: qty 5

## 2018-09-09 NOTE — Progress Notes (Addendum)
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 She has been referred to [place] for findings of bone metastases and her recent MRI. She has a prior history of 3 packs/day day smoking for over 45 years and quit smoking 5 years ago.She had a CT chest abdomen pelvis in 2018 which showed a 5 mm lung nodule in the left lower lobe. Recently over the last 2 months patient has been having worsening back pain and was referred to orthopedics. 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 Pathologic fracture of S1.  PET scan showed 2 RUL lung nodules, hilar and mediastinal adenopathy and widespread bone mets. MRI brain negative.  Patientcompletedpalliative RT to her spine. Bronchoscopy showed small cell lung cancer.  Palliative carboplatin, etoposide and Tecentriq started on 08/18/2018   Interval history-she feels well today.  Reports her appetite is stable.  Denies any nausea or vomiting.  She does have mild low back pain which is intermittent and she does not use opioid pain meds as much.  ECOG PS- 1 Pain scale- 0  Opioid associated constipation- no  Review of systems- Review of Systems  Constitutional: Negative for chills, fever, malaise/fatigue and weight loss.  HENT: Negative for congestion, ear discharge and nosebleeds.   Eyes: Negative for blurred vision.  Respiratory: Negative for cough, hemoptysis, sputum production, shortness of breath and wheezing.   Cardiovascular: Negative for chest pain, palpitations, orthopnea and claudication.  Gastrointestinal: Negative for abdominal pain, blood in stool, constipation, diarrhea, heartburn, melena, nausea and vomiting.  Genitourinary: Negative for dysuria, flank pain, frequency, hematuria and urgency.  Musculoskeletal: Positive for back pain. Negative for joint pain and myalgias.  Skin: Negative for rash.  Neurological: Negative for dizziness, tingling, focal weakness, seizures, weakness and headaches.  Endo/Heme/Allergies: Does not bruise/bleed easily.  Psychiatric/Behavioral: Negative for depression and suicidal ideas. The patient does not have insomnia.       Allergies  Allergen Reactions  . Bee Venom Swelling     Past Medical History:  Diagnosis Date  . Arthritis   . Cirrhosis ([place])   . Depression   . GERD (gastroesophageal reflux disease)   . Hyperlipidemia   . Hypertension   . Metastatic bone cancer [facility])      Past Surgical History:  Procedure Laterality Date  . APPENDECTOMY  1971  . CHOLECYSTECTOMY  1971  . COLONOSCOPY WITH PROPOFOL N/A 11/15/2016   Procedure: COLONOSCOPY WITH PROPOFOL;  Surgeon: Jonathon Bellows, MD;  Location: [facility] ENDOSCOPY;  Service: Gastroenterology;  Laterality: N/A;  . ENDOBRONCHIAL ULTRASOUND Right 07/30/2018   Procedure: ENDOBRONCHIAL ULTRASOUND;  Surgeon: Laverle Hobby, MD;  Location: [facility] ORS;  Service: Pulmonary;  Laterality: Right;  . ESOPHAGOGASTRODUODENOSCOPY (EGD) WITH PROPOFOL N/A 01/07/2017  Procedure: ESOPHAGOGASTRODUODENOSCOPY (EGD) WITH PROPOFOL;  Surgeon: Jonathon Bellows, MD;  Location: [facility]  ENDOSCOPY;  Service: Gastroenterology;  Laterality: N/A;  . LAPAROSCOPY N/A 03/01/2017   Procedure: LAPAROSCOPY DIAGNOSTIC;  Surgeon: Robert Bellow, MD;  Location: [facility] ORS;  Service: General;  Laterality: N/A;  . PORTA CATH INSERTION N/A 08/14/2018   Procedure: PORTA CATH INSERTION;  Surgeon: Algernon Huxley, MD;  Location: [place] CV LAB;  Service: Cardiovascular;  Laterality: N/A;  . TONSILECTOMY, ADENOIDECTOMY, BILATERAL MYRINGOTOMY [place]  . TONSILLECTOMY    . VENTRAL HERNIA REPAIR N/A 03/01/2017   10 x 14 CM Ventralight ST mesh, intraperitoneal location.   . VENTRAL HERNIA REPAIR N/A 03/01/2017   Procedure: HERNIA REPAIR VENTRAL ADULT;  Surgeon: Robert Bellow, MD;  Location: [facility] ORS;  Service: General;  Laterality: N/A;    Social History   Socioeconomic History  . Marital status: Married    Spouse name: [name]  . Number of children: Not on file  . Years of education: Not on file  . Highest education level: Not on file  Occupational History  . Not on file  Social Needs  . Financial resource strain: Not hard at all  . Food insecurity    Worry: Never true    Inability: Never true  . Transportation needs    Medical: No    Non-medical: No  Tobacco Use  . Smoking status: Former Smoker    Packs/day: 2.00    Years: 50.00    Pack years: 100.00    Types: Cigarettes    Quit date: 02/07/2012    Years since quitting: 6.5  . Smokeless tobacco: Never Used  . Tobacco comment: uses vape without nicotine, one capsule every 3 months.  Substance and Sexual Activity  . Alcohol use: No    Alcohol/week: 0.0 standard drinks  . Drug use: No  . Sexual activity: Yes  Lifestyle  . Physical activity    Days per week: Not on file    Minutes per session: Not on file  . Stress: Not at all  Relationships  . Social Herbalist on phone: Not on file    Gets together: Not on file    Attends religious service: Not on file    Active member of club or organization: Not  on file    Attends meetings of clubs or organizations: Not on file    Relationship status: Not on file  . Intimate partner violence    Fear of current or ex partner: No    Emotionally abused: No    Physically abused: No    Forced sexual activity: No  Other Topics Concern  . Not on file  Social History Narrative   Married   Retired   Clinical cytogeneticist level of education    No children    1 cup of coffee    Family History  Problem Relation Age of Onset  . Hypertension Mother   . Ovarian cancer Mother 79  . Heart disease Father   . Stroke Father   . Ovarian cancer Sister        ? dx cancer had hyst.  . Breast cancer Neg Hx      Current Outpatient Medications:  .  buPROPion (WELLBUTRIN XL) 300 MG 24 hr tablet, Take 1 tablet by mouth once daily, Disp: 90 tablet, Rfl: 0 .  escitalopram (LEXAPRO) 10 MG tablet, Take 1 tablet (10 mg total) by mouth daily., Disp: 90 tablet, Rfl: 1 .  ibuprofen (ADVIL,MOTRIN) 200 MG tablet, Take 400 mg by mouth every 8 (eight) hours as needed for headache or moderate pain. , Disp: , Rfl:  .  lidocaine-prilocaine (EMLA) cream, Apply to affected area once, Disp: 30 g, Rfl: 3 .  loperamide (IMODIUM) 2 MG capsule, Take 1 capsule (2 mg total) by mouth every 2 (two) hours as needed for diarrhea or loose stools. Do not exceed 16mg  daily., Disp: 30 capsule, Rfl: 1 .  LORazepam (ATIVAN) 0.5 MG tablet, Take 1 tablet (0.5 mg total) by mouth every 6 (six) hours as needed (Nausea or vomiting)., Disp: 30 tablet, Rfl: 0 .  Multiple Vitamins-Minerals (PRESERVISION AREDS 2) CAPS, Take 1 capsule by mouth 2 (two) times a day. , Disp: , Rfl:  .  ondansetron (ZOFRAN) 8 MG tablet, Take 1 tablet (8 mg total) by mouth 2 (two) times daily as needed for refractory nausea / vomiting. Start on day 3 after carboplatin chemo., Disp: 30 tablet, Rfl: 1 .  oxyCODONE (OXY IR/ROXICODONE) 5 MG immediate release tablet, Take 1 tablet (5 mg total) by mouth every 6 (six) hours as needed for  severe pain., Disp: 120 tablet, Rfl: 0 .  pantoprazole (PROTONIX) 40 MG tablet, TAKE 1 TABLET BY MOUTH TWICE DAILY BEFORE A MEAL, Disp: 180 tablet, Rfl: 0 .  potassium chloride SA (K-DUR) 20 MEQ tablet, Take 1 tablet (20 mEq total) by mouth daily., Disp: 10 tablet, Rfl: 0 .  prochlorperazine (COMPAZINE) 10 MG tablet, Take 1 tablet (10 mg total) by mouth every 6 (six) hours as needed (Nausea or vomiting)., Disp: 30 tablet, Rfl: 1 .  rosuvastatin (CRESTOR) 20 MG tablet, TAKE 1 TABLET BY MOUTH ONCE DAILY, Disp: 90 tablet, Rfl: 3  Physical exam:  Vitals:   09/09/18 0831  BP: 131/80  Pulse: 96  Resp: 18  Temp: 99.8 F (37.7 C)  TempSrc: Tympanic  SpO2: 97%  Weight: 171 lb 12.8 oz (77.9 kg)   Physical Exam Constitutional:      General: She is not in acute distress. HENT:     Head: Normocephalic and atraumatic.  Eyes:     Pupils: Pupils are equal, round, and reactive to light.  Neck:     Musculoskeletal: Normal range of motion.  Cardiovascular:     Rate and Rhythm: Normal rate and regular rhythm.     Heart sounds: Normal heart sounds.  Pulmonary:     Effort: Pulmonary effort is normal.     Breath sounds: Normal breath sounds.  Abdominal:     General: Bowel sounds are normal.     Palpations: Abdomen is soft.  Skin:    General: Skin is warm and dry.  Neurological:     Mental Status: She is alert and oriented to person, place, and time.      CMP Latest Ref Rng & Units 09/09/2018  Glucose 70 - 99 mg/dL 102(H)  BUN 8 - 23 mg/dL 13  Creatinine 0.44 - 1.00 mg/dL 0.79  Sodium 135 - 145 mmol/L 142  Potassium 3.5 - 5.1 mmol/L 3.9  Chloride 98 - 111 mmol/L 107  CO2 22 - 32 mmol/L 27  Calcium 8.9 - 10.3 mg/dL 8.2(L)  Total Protein 6.5 - 8.1 g/dL 6.5  Total Bilirubin 0.3 - 1.2 mg/dL 0.4  Alkaline Phos 38 - 126 U/L 418(H)  AST 15 - 41 U/L 20  ALT 0 - 44 U/L 14   CBC Latest Ref Rng & Units 09/09/2018  WBC 4.0 - 10.5 K/uL 9.6  Hemoglobin 12.0 -  15.0 g/dL 9.5(L)  Hematocrit 36.0 -  46.0 % 30.7(L)  Platelets 150 - 400 K/uL 240     Assessment and plan- Patient is a 68 y.o. female with extensive stage small cell lung cancer with bone metastases stage IV cT3 N2 M1.    She is here for on treatment assessment prior to cycle 2-day 1 of carboplatin etoposide and Tecentriq  Counts okay to proceed with cycle 2 of carboplatin etoposide and Tecentriq today.  She will receive etoposide tomorrow and day after and receive onpro Neulasta on day 3.  Chemo-induced anemia: Stable continue to monitor  Plan to get repeat CT chest abdomen and pelvis with contrast and bone scan in 2 weeks time.  I will see her back in 3 weeks.  Port labs: CBC with differential, CMP for cycle 3 of carboplatin etoposide and Tecentriq with onpro neulasta support  Patient does have an elevated alkaline phosphatase level of 418. Probably secondary to bone metastases.  AST ALT and total bilirubin are normal.  I will obtain a GGT and fractionated alkaline phosphatase at next visit.   Visit Diagnosis 1. Small cell lung cancer, right upper lobe ([place])   2. Alkaline phosphatase elevation   3. Bone metastases ([place])   4. Encounter for antineoplastic chemotherapy      Dr. Randa Evens, MD, MPH [facility] [phone] 09/09/2018 1:12 PM

## 2018-09-09 NOTE — Progress Notes (Signed)
Patient is here for follow up, she is doing well no complaints.

## 2018-09-10 ENCOUNTER — Inpatient Hospital Stay: Payer: PPO | Admitting: Hospice and Palliative Medicine

## 2018-09-10 ENCOUNTER — Inpatient Hospital Stay: Payer: PPO

## 2018-09-10 ENCOUNTER — Other Ambulatory Visit: Payer: Self-pay

## 2018-09-10 VITALS — BP 154/81 | HR 94 | Temp 97.5°F | Resp 18

## 2018-09-10 DIAGNOSIS — Z5111 Encounter for antineoplastic chemotherapy: Secondary | ICD-10-CM | POA: Diagnosis not present

## 2018-09-10 DIAGNOSIS — C3411 Malignant neoplasm of upper lobe, right bronchus or lung: Secondary | ICD-10-CM

## 2018-09-10 MED ORDER — SODIUM CHLORIDE 0.9 % IV SOLN
100.0000 mg/m2 | Freq: Once | INTRAVENOUS | Status: AC
  Administered 2018-09-10: 190 mg via INTRAVENOUS
  Filled 2018-09-10: qty 9.5

## 2018-09-10 MED ORDER — SODIUM CHLORIDE 0.9% FLUSH
10.0000 mL | INTRAVENOUS | Status: DC | PRN
  Filled 2018-09-10: qty 10

## 2018-09-10 MED ORDER — SODIUM CHLORIDE 0.9 % IV SOLN
Freq: Once | INTRAVENOUS | Status: AC
  Administered 2018-09-10: 09:00:00 via INTRAVENOUS
  Filled 2018-09-10: qty 250

## 2018-09-10 MED ORDER — HEPARIN SOD (PORK) LOCK FLUSH 100 UNIT/ML IV SOLN
500.0000 [IU] | Freq: Once | INTRAVENOUS | Status: AC | PRN
  Administered 2018-09-10: 500 [IU]
  Filled 2018-09-10: qty 5

## 2018-09-10 MED ORDER — DEXAMETHASONE SODIUM PHOSPHATE 10 MG/ML IJ SOLN
10.0000 mg | Freq: Once | INTRAMUSCULAR | Status: AC
  Administered 2018-09-10: 10 mg via INTRAVENOUS
  Filled 2018-09-10: qty 1

## 2018-09-11 ENCOUNTER — Other Ambulatory Visit: Payer: Self-pay

## 2018-09-11 ENCOUNTER — Inpatient Hospital Stay: Payer: PPO

## 2018-09-11 ENCOUNTER — Other Ambulatory Visit: Payer: Self-pay | Admitting: Oncology

## 2018-09-11 VITALS — BP 142/68 | HR 87 | Temp 97.8°F | Resp 18

## 2018-09-11 DIAGNOSIS — Z5111 Encounter for antineoplastic chemotherapy: Secondary | ICD-10-CM | POA: Diagnosis not present

## 2018-09-11 DIAGNOSIS — C3411 Malignant neoplasm of upper lobe, right bronchus or lung: Secondary | ICD-10-CM

## 2018-09-11 LAB — ACID FAST CULTURE WITH REFLEXED SENSITIVITIES (MYCOBACTERIA): Acid Fast Culture: NEGATIVE

## 2018-09-11 MED ORDER — DEXAMETHASONE SODIUM PHOSPHATE 10 MG/ML IJ SOLN
10.0000 mg | Freq: Once | INTRAMUSCULAR | Status: AC
  Administered 2018-09-11: 10 mg via INTRAVENOUS
  Filled 2018-09-11: qty 1

## 2018-09-11 MED ORDER — HEPARIN SOD (PORK) LOCK FLUSH 100 UNIT/ML IV SOLN
500.0000 [IU] | Freq: Once | INTRAVENOUS | Status: DC | PRN
  Filled 2018-09-11: qty 5

## 2018-09-11 MED ORDER — SODIUM CHLORIDE 0.9 % IV SOLN
100.0000 mg/m2 | Freq: Once | INTRAVENOUS | Status: AC
  Administered 2018-09-11: 190 mg via INTRAVENOUS
  Filled 2018-09-11: qty 9.5

## 2018-09-11 MED ORDER — SODIUM CHLORIDE 0.9 % IV SOLN
Freq: Once | INTRAVENOUS | Status: AC
  Administered 2018-09-11: 09:00:00 via INTRAVENOUS
  Filled 2018-09-11: qty 250

## 2018-09-11 MED ORDER — PEGFILGRASTIM 6 MG/0.6ML ~~LOC~~ PSKT
6.0000 mg | PREFILLED_SYRINGE | Freq: Once | SUBCUTANEOUS | Status: AC
  Administered 2018-09-11: 6 mg via SUBCUTANEOUS
  Filled 2018-09-11: qty 0.6

## 2018-09-15 ENCOUNTER — Encounter: Payer: Self-pay | Admitting: Radiation Oncology

## 2018-09-15 ENCOUNTER — Ambulatory Visit
Admission: RE | Admit: 2018-09-15 | Discharge: 2018-09-15 | Disposition: A | Discharge status: Home / Self Care | Payer: PPO | Source: Ambulatory Visit | Attending: Radiation Oncology | Admitting: Radiation Oncology

## 2018-09-15 ENCOUNTER — Other Ambulatory Visit: Payer: Self-pay

## 2018-09-15 VITALS — BP 161/74 | HR 96 | Temp 97.8°F | Resp 18 | Wt 170.0 lb

## 2018-09-15 DIAGNOSIS — C3490 Malignant neoplasm of unspecified part of unspecified bronchus or lung: Secondary | ICD-10-CM | POA: Diagnosis not present

## 2018-09-15 DIAGNOSIS — C7951 Secondary malignant neoplasm of bone: Secondary | ICD-10-CM

## 2018-09-15 DIAGNOSIS — Z923 Personal history of irradiation: Secondary | ICD-10-CM | POA: Insufficient documentation

## 2018-09-15 NOTE — Progress Notes (Signed)
Radiation Oncology Follow up Note  Name: Anne [name]   Date:   09/15/2018 MRN:  [id number] DOB: February 21, 1950    This 68 y.o. female presents to the clinic today for 1 month follow-up status post palliative radiation therapy to her lumbar spine.  In patient with known extensive stage small cell lung cancer  REFERRING PROVIDER: Leone Haven, MD  HPI: Patient is a 68 year old female with known extensive stage small cell lung cancer now 1 month out from palliative radiation therapy to her lumbar spine for metastatic disease.  She is seen today in routine follow-up and is doing well.  She states the pain is markedly improved.  She is having no problems with ambulation.  She is having no focal neurologic deficits.  She currently is on.cycle 3 of carboplatin etoposide and Tecentriq with onpro neulasta support  COMPLICATIONS OF TREATMENT: none  FOLLOW UP COMPLIANCE: keeps appointments   PHYSICAL EXAM:  BP (!) 161/74 (BP Location: Left Arm, Patient Position: Sitting)   Pulse 96   Temp 97.8 F (36.6 C) (Tympanic)   Resp 18   Wt 170 lb (77.1 kg)   BMI 30.11 kg/m  The palpation of her spine does not elicit pain.  Motor or sensory and DTR levels are equal symmetric in the lower extremities bilaterally.  Well-developed well-nourished patient in NAD. HEENT reveals PERLA, EOMI, discs not visualized.  Oral cavity is clear. No oral mucosal lesions are identified. Neck is clear without evidence of cervical or supraclavicular adenopathy. Lungs are clear to A&P. Cardiac examination is essentially unremarkable with regular rate and rhythm without murmur rub or thrill. Abdomen is benign with no organomegaly or masses noted. Motor sensory and DTR levels are equal and symmetric in the upper and lower extremities. Cranial nerves II through XII are grossly intact. Proprioception is intact. No peripheral adenopathy or edema is identified. No motor or sensory levels are noted. Crude visual fields are within  normal range.  RADIOLOGY RESULTS: No current films for review  PLAN: Present time she is achieved excellent palliation from her lower back pain.  I am turning follow-up care over to medical oncology she continues on chemotherapy and immunotherapy as described above.  I would be happy to reevaluate the patient anytime should further palliative treatment be indicated.  I would like to take this opportunity to thank you for allowing me to participate in the care of your patient.Noreene Filbert, MD

## 2018-09-22 ENCOUNTER — Other Ambulatory Visit: Payer: Self-pay

## 2018-09-22 ENCOUNTER — Encounter
Admission: RE | Admit: 2018-09-22 | Discharge: 2018-09-22 | Disposition: A | Discharge status: Home / Self Care | Payer: PPO | Source: Ambulatory Visit | Attending: Oncology | Admitting: Oncology

## 2018-09-22 ENCOUNTER — Ambulatory Visit
Admission: RE | Admit: 2018-09-22 | Discharge: 2018-09-22 | Disposition: A | Discharge status: Home / Self Care | Payer: PPO | Source: Ambulatory Visit | Attending: Oncology | Admitting: Oncology

## 2018-09-22 DIAGNOSIS — C3411 Malignant neoplasm of upper lobe, right bronchus or lung: Secondary | ICD-10-CM | POA: Diagnosis not present

## 2018-09-22 DIAGNOSIS — C3490 Malignant neoplasm of unspecified part of unspecified bronchus or lung: Secondary | ICD-10-CM | POA: Diagnosis not present

## 2018-09-22 DIAGNOSIS — C7951 Secondary malignant neoplasm of bone: Secondary | ICD-10-CM | POA: Diagnosis not present

## 2018-09-22 DIAGNOSIS — C3491 Malignant neoplasm of unspecified part of right bronchus or lung: Secondary | ICD-10-CM | POA: Diagnosis not present

## 2018-09-22 DIAGNOSIS — R918 Other nonspecific abnormal finding of lung field: Secondary | ICD-10-CM | POA: Diagnosis not present

## 2018-09-22 DIAGNOSIS — J432 Centrilobular emphysema: Secondary | ICD-10-CM | POA: Diagnosis not present

## 2018-09-22 MED ORDER — IOHEXOL 300 MG/ML  SOLN
100.0000 mL | Freq: Once | INTRAMUSCULAR | Status: AC | PRN
  Administered 2018-09-22: 100 mL via INTRAVENOUS

## 2018-09-22 MED ORDER — TECHNETIUM TC 99M MEDRONATE IV KIT
20.0000 | PACK | Freq: Once | INTRAVENOUS | Status: AC | PRN
  Administered 2018-09-22: 23.45 via INTRAVENOUS

## 2018-09-29 ENCOUNTER — Other Ambulatory Visit: Payer: Self-pay

## 2018-09-30 ENCOUNTER — Inpatient Hospital Stay: Payer: PPO

## 2018-09-30 ENCOUNTER — Other Ambulatory Visit: Payer: Self-pay | Admitting: *Deleted

## 2018-09-30 ENCOUNTER — Inpatient Hospital Stay (HOSPITAL_BASED_OUTPATIENT_CLINIC_OR_DEPARTMENT_OTHER): Payer: PPO | Admitting: Hospice and Palliative Medicine

## 2018-09-30 ENCOUNTER — Other Ambulatory Visit: Payer: Self-pay

## 2018-09-30 ENCOUNTER — Inpatient Hospital Stay (HOSPITAL_BASED_OUTPATIENT_CLINIC_OR_DEPARTMENT_OTHER): Payer: PPO | Admitting: Oncology

## 2018-09-30 ENCOUNTER — Encounter: Payer: Self-pay | Admitting: Oncology

## 2018-09-30 VITALS — BP 122/83 | HR 84 | Temp 98.0°F | Resp 16 | Ht 63.0 in | Wt 167.7 lb

## 2018-09-30 DIAGNOSIS — Z515 Encounter for palliative care: Secondary | ICD-10-CM | POA: Diagnosis not present

## 2018-09-30 DIAGNOSIS — C3411 Malignant neoplasm of upper lobe, right bronchus or lung: Secondary | ICD-10-CM

## 2018-09-30 DIAGNOSIS — R748 Abnormal levels of other serum enzymes: Secondary | ICD-10-CM | POA: Diagnosis not present

## 2018-09-30 DIAGNOSIS — Z5112 Encounter for antineoplastic immunotherapy: Secondary | ICD-10-CM

## 2018-09-30 DIAGNOSIS — Z5111 Encounter for antineoplastic chemotherapy: Secondary | ICD-10-CM

## 2018-09-30 DIAGNOSIS — D6481 Anemia due to antineoplastic chemotherapy: Secondary | ICD-10-CM

## 2018-09-30 DIAGNOSIS — I10 Essential (primary) hypertension: Secondary | ICD-10-CM | POA: Diagnosis not present

## 2018-09-30 DIAGNOSIS — Z79899 Other long term (current) drug therapy: Secondary | ICD-10-CM

## 2018-09-30 DIAGNOSIS — K7460 Unspecified cirrhosis of liver: Secondary | ICD-10-CM | POA: Diagnosis not present

## 2018-09-30 DIAGNOSIS — Z87891 Personal history of nicotine dependence: Secondary | ICD-10-CM

## 2018-09-30 DIAGNOSIS — D649 Anemia, unspecified: Secondary | ICD-10-CM

## 2018-09-30 DIAGNOSIS — R634 Abnormal weight loss: Secondary | ICD-10-CM

## 2018-09-30 DIAGNOSIS — C7951 Secondary malignant neoplasm of bone: Secondary | ICD-10-CM

## 2018-09-30 DIAGNOSIS — T451X5A Adverse effect of antineoplastic and immunosuppressive drugs, initial encounter: Secondary | ICD-10-CM

## 2018-09-30 DIAGNOSIS — Z791 Long term (current) use of non-steroidal anti-inflammatories (NSAID): Secondary | ICD-10-CM | POA: Diagnosis not present

## 2018-09-30 DIAGNOSIS — E785 Hyperlipidemia, unspecified: Secondary | ICD-10-CM

## 2018-09-30 DIAGNOSIS — M549 Dorsalgia, unspecified: Secondary | ICD-10-CM

## 2018-09-30 DIAGNOSIS — K219 Gastro-esophageal reflux disease without esophagitis: Secondary | ICD-10-CM | POA: Diagnosis not present

## 2018-09-30 DIAGNOSIS — Z95828 Presence of other vascular implants and grafts: Secondary | ICD-10-CM

## 2018-09-30 LAB — COMPREHENSIVE METABOLIC PANEL
ALT: 18 U/L (ref 0–44)
AST: 20 U/L (ref 15–41)
Albumin: 3.6 g/dL (ref 3.5–5.0)
Alkaline Phosphatase: 229 U/L — ABNORMAL HIGH (ref 38–126)
Anion gap: 8 (ref 5–15)
BUN: 12 mg/dL (ref 8–23)
CO2: 26 mmol/L (ref 22–32)
Calcium: 8.5 mg/dL — ABNORMAL LOW (ref 8.9–10.3)
Chloride: 110 mmol/L (ref 98–111)
Creatinine, Ser: 0.87 mg/dL (ref 0.44–1.00)
GFR calc Af Amer: >60 mL/min (ref >60)
GFR calc non Af Amer: >60 mL/min (ref >60)
Glucose, Bld: 110 mg/dL — ABNORMAL HIGH (ref 70–99)
Potassium: 3.4 mmol/L — ABNORMAL LOW (ref 3.5–5.1)
Sodium: 144 mmol/L (ref 135–145)
Total Bilirubin: 0.6 mg/dL (ref 0.3–1.2)
Total Protein: 6.6 g/dL (ref 6.5–8.1)

## 2018-09-30 LAB — GAMMA GT: GGT: 60 U/L — ABNORMAL HIGH (ref 7–50)

## 2018-09-30 LAB — CBC WITH DIFFERENTIAL/PLATELET
Abs Immature Granulocytes: 0.9 10*3/uL — ABNORMAL HIGH (ref 0.00–0.07)
Basophils Absolute: 0.1 10*3/uL (ref 0.0–0.1)
Basophils Relative: 1 %
Eosinophils Absolute: 0 10*3/uL (ref 0.0–0.5)
Eosinophils Relative: 0 %
HCT: 27.7 % — ABNORMAL LOW (ref 36.0–46.0)
Hemoglobin: 8.6 g/dL — ABNORMAL LOW (ref 12.0–15.0)
Immature Granulocytes: 8 %
Lymphocytes Relative: 9 %
Lymphs Abs: 1 10*3/uL (ref 0.7–4.0)
MCH: 27.4 pg (ref 26.0–34.0)
MCHC: 31 g/dL (ref 30.0–36.0)
MCV: 88.2 fL (ref 80.0–100.0)
Monocytes Absolute: 0.9 10*3/uL (ref 0.1–1.0)
Monocytes Relative: 8 %
Neutro Abs: 8.7 10*3/uL — ABNORMAL HIGH (ref 1.7–7.7)
Neutrophils Relative %: 74 %
Platelets: 199 10*3/uL (ref 150–400)
RBC: 3.14 MIL/uL — ABNORMAL LOW (ref 3.87–5.11)
RDW: 21.4 % — ABNORMAL HIGH (ref 11.5–15.5)
WBC: 11.5 10*3/uL — ABNORMAL HIGH (ref 4.0–10.5)
nRBC: 1.1 % — ABNORMAL HIGH (ref 0.0–0.2)

## 2018-09-30 MED ORDER — SODIUM CHLORIDE 0.9% FLUSH
10.0000 mL | Freq: Once | INTRAVENOUS | Status: AC
  Administered 2018-09-30: 08:00:00 10 mL via INTRAVENOUS
  Filled 2018-09-30: qty 10

## 2018-09-30 MED ORDER — SODIUM CHLORIDE 0.9 % IV SOLN
1200.0000 mg | Freq: Once | INTRAVENOUS | Status: AC
  Administered 2018-09-30: 1200 mg via INTRAVENOUS
  Filled 2018-09-30: qty 20

## 2018-09-30 MED ORDER — HEPARIN SOD (PORK) LOCK FLUSH 100 UNIT/ML IV SOLN
500.0000 [IU] | Freq: Once | INTRAVENOUS | Status: AC | PRN
  Administered 2018-09-30: 500 [IU]
  Filled 2018-09-30: qty 5

## 2018-09-30 MED ORDER — SODIUM CHLORIDE 0.9 % IV SOLN
Freq: Once | INTRAVENOUS | Status: AC
  Administered 2018-09-30: 10:00:00 via INTRAVENOUS
  Filled 2018-09-30: qty 5

## 2018-09-30 MED ORDER — PALONOSETRON HCL INJECTION 0.25 MG/5ML
0.2500 mg | Freq: Once | INTRAVENOUS | Status: AC
  Administered 2018-09-30: 0.25 mg via INTRAVENOUS
  Filled 2018-09-30: qty 5

## 2018-09-30 MED ORDER — SODIUM CHLORIDE 0.9 % IV SOLN
Freq: Once | INTRAVENOUS | Status: AC
  Administered 2018-09-30: 09:00:00 via INTRAVENOUS
  Filled 2018-09-30: qty 250

## 2018-09-30 MED ORDER — SODIUM CHLORIDE 0.9 % IV SOLN
462.5000 mg | Freq: Once | INTRAVENOUS | Status: AC
  Administered 2018-09-30: 460 mg via INTRAVENOUS
  Filled 2018-09-30: qty 46

## 2018-09-30 MED ORDER — SODIUM CHLORIDE 0.9 % IV SOLN
100.0000 mg/m2 | Freq: Once | INTRAVENOUS | Status: AC
  Administered 2018-09-30: 190 mg via INTRAVENOUS
  Filled 2018-09-30: qty 9.5

## 2018-09-30 NOTE — Progress Notes (Signed)
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  Discuss CT scan results  Heme/Onc history: patient is a 68 year old female with a past medical history significant for hypertension hyperlipidemia obesity and cirrhosis of the liver among other medical problems. She has been referred to [place] for findings of bone metastases and her recent MRI. She has a prior history of 3 packs/day day smoking for over 45 years and quit smoking 5 years ago.She had a CT chest abdomen pelvis in 2018 which showed a 5 mm lung nodule in the left lower lobe. Recently over the last 2 months patient has been having worsening back pain and was referred to orthopedics. 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 Pathologic fracture of S1.  PET scan showed 2 RUL lung nodules, hilar and mediastinal adenopathy and widespread bone mets. MRI brain negative.  Patientcompletedpalliative RT to her spine. Bronchoscopy showed small cell lung cancer. Palliative carboplatin, etoposide and Tecentriq started on 08/18/2018  Interval history-he reports feeling well and denies any pain at this time.  Denies any nausea or vomiting.  She has mild chronic fatigue which has remained stable essentially  ECOG PS- 1 Pain scale- 0  Review  of systems- Review of Systems  Constitutional: Positive for malaise/fatigue. Negative for chills, fever and weight loss.  HENT: Negative for congestion, ear discharge and nosebleeds.   Eyes: Negative for blurred vision.  Respiratory: Negative for cough, hemoptysis, sputum production, shortness of breath and wheezing.   Cardiovascular: Negative for chest pain, palpitations, orthopnea and claudication.  Gastrointestinal: Negative for abdominal pain, blood in stool, constipation, diarrhea, heartburn, melena, nausea and vomiting.  Genitourinary: Negative for dysuria, flank pain, frequency, hematuria and urgency.  Musculoskeletal: Negative for back pain, joint pain and myalgias.  Skin: Negative for rash.  Neurological: Negative for dizziness, tingling, focal weakness, seizures, weakness and headaches.  Endo/Heme/Allergies: Does not bruise/bleed easily.  Psychiatric/Behavioral: Negative for depression and suicidal ideas. The patient does not have insomnia.       Allergies  Allergen Reactions   Bee Venom Swelling     Past Medical History:  Diagnosis Date   Arthritis    Cirrhosis ([place])    Depression    GERD (gastroesophageal reflux disease)    Hyperlipidemia    Hypertension    Lung cancer ([place])    Metastatic bone cancer ([place])      Past Surgical History:  Procedure Laterality Date   APPENDECTOMY  1971   CHOLECYSTECTOMY  1971   COLONOSCOPY WITH PROPOFOL N/A 11/15/2016   Procedure: COLONOSCOPY WITH PROPOFOL;  Surgeon: Jonathon Bellows, MD;  Location: [facility] ENDOSCOPY;  Service: Gastroenterology;  Laterality: N/A;   ENDOBRONCHIAL ULTRASOUND Right 07/30/2018   Procedure: ENDOBRONCHIAL ULTRASOUND;  Surgeon: Laverle Hobby, MD;  Location: [facility] ORS;  Service: Pulmonary;  Laterality: Right;   ESOPHAGOGASTRODUODENOSCOPY (EGD) WITH PROPOFOL N/A 01/07/2017   Procedure: ESOPHAGOGASTRODUODENOSCOPY (EGD)  WITH PROPOFOL;  Surgeon: Jonathon Bellows, MD;  Location: [facility] ENDOSCOPY;  Service:  Gastroenterology;  Laterality: N/A;   LAPAROSCOPY N/A 03/01/2017   Procedure: LAPAROSCOPY DIAGNOSTIC;  Surgeon: Robert Bellow, MD;  Location: [facility] ORS;  Service: General;  Laterality: N/A;   PORTA CATH INSERTION N/A 08/14/2018   Procedure: PORTA CATH INSERTION;  Surgeon: Algernon Huxley, MD;  Location: [place] [facility];  Service: Cardiovascular;  Laterality: N/A;   TONSILECTOMY, ADENOIDECTOMY, BILATERAL MYRINGOTOMY AND TUBES  1955   TONSILLECTOMY     VENTRAL HERNIA REPAIR N/A 03/01/2017   10 x 14 CM Ventralight ST mesh, intraperitoneal location.    VENTRAL HERNIA REPAIR N/A 03/01/2017   Procedure: HERNIA REPAIR VENTRAL ADULT;  Surgeon: Robert Bellow, MD;  Location: [facility] ORS;  Service: General;  Laterality: N/A;    Social History   Socioeconomic History   Marital status: Married    Spouse name: Johnny   Number of children: Not on file   Years of education: Not on file   Highest education level: Not on file  Occupational History   Not on file  Social Needs   Financial resource strain: Not hard at all   Food insecurity    Worry: Never true    Inability: Never true   Transportation needs    Medical: No    Non-medical: No  Tobacco Use   Smoking status: Former Smoker    Packs/day: 2.00    Years: 50.00    Pack years: 100.00    Types: Cigarettes    Quit date: 02/07/2012    Years since quitting: 6.6   Smokeless tobacco: Never Used   Tobacco comment: uses vape without nicotine, one capsule every 3 months.  Substance and Sexual Activity   Alcohol use: No    Alcohol/week: 0.0 standard drinks   Drug use: No   Sexual activity: Yes  Lifestyle   Physical activity    Days per week: Not on file    Minutes per session: Not on file   Stress: Not at all  Relationships   Social connections    Talks on phone: Not on file    Gets together: Not on file    Attends religious service: Not on file    Active member of club or organization: Not on file    Attends  meetings of clubs or organizations: Not on file    Relationship status: Not on file   Intimate partner violence    Fear of current or ex partner: No    Emotionally abused: No    Physically abused: No    Forced sexual activity: No  Other Topics Concern   Not on file  Social History Narrative   Married   Retired   Clinical cytogeneticist level of education    No children    1 cup of coffee    Family History  Problem Relation Age of Onset   Hypertension Mother    Ovarian cancer Mother 62   Heart disease Father    Stroke Father    Ovarian cancer Sister        ? dx cancer had hyst.   Breast cancer Neg Hx      Current Outpatient Medications:    buPROPion (WELLBUTRIN XL) 300 MG 24 hr tablet, Take 1 tablet by mouth once daily, Disp: 90 tablet, Rfl: 0   escitalopram (LEXAPRO) 10 MG tablet, Take 1 tablet (10 mg total) by mouth daily., Disp: 90 tablet, Rfl: 1   ibuprofen (  ADVIL,MOTRIN) 200 MG tablet, Take 400 mg by mouth every 8 (eight) hours as needed for headache or moderate pain. , Disp: , Rfl:    lidocaine-prilocaine (EMLA) cream, Apply to affected area once, Disp: 30 g, Rfl: 3   loperamide (IMODIUM) 2 MG capsule, Take 1 capsule (2 mg total) by mouth every 2 (two) hours as needed for diarrhea or loose stools. Do not exceed 16mg  daily., Disp: 30 capsule, Rfl: 1   LORazepam (ATIVAN) 0.5 MG tablet, Take 1 tablet (0.5 mg total) by mouth every 6 (six) hours as needed (Nausea or vomiting)., Disp: 30 tablet, Rfl: 0   Multiple Vitamins-Minerals (PRESERVISION AREDS 2) CAPS, Take 1 capsule by mouth 2 (two) times a day. , Disp: , Rfl:    ondansetron (ZOFRAN) 8 MG tablet, Take 1 tablet (8 mg total) by mouth 2 (two) times daily as needed for refractory nausea / vomiting. Start on day 3 after carboplatin chemo., Disp: 30 tablet, Rfl: 1   oxyCODONE (OXY IR/ROXICODONE) 5 MG immediate release tablet, Take 1 tablet (5 mg total) by mouth every 6 (six) hours as needed for severe pain., Disp:  120 tablet, Rfl: 0   pantoprazole (PROTONIX) 40 MG tablet, TAKE 1 TABLET BY MOUTH TWICE DAILY BEFORE A MEAL, Disp: 180 tablet, Rfl: 0   potassium chloride SA (K-DUR) 20 MEQ tablet, Take 1 tablet (20 mEq total) by mouth daily., Disp: 10 tablet, Rfl: 0   prochlorperazine (COMPAZINE) 10 MG tablet, Take 1 tablet (10 mg total) by mouth every 6 (six) hours as needed (Nausea or vomiting)., Disp: 30 tablet, Rfl: 1   rosuvastatin (CRESTOR) 20 MG tablet, TAKE 1 TABLET BY MOUTH ONCE DAILY, Disp: 90 tablet, Rfl: 3  Physical exam:  Vitals:   09/30/18 0846  BP: 122/83  Pulse: 84  Resp: 16  Temp: 98 F (36.7 C)  TempSrc: Tympanic  Weight: 167 lb 11.2 oz (76.1 kg)  Height: 5\' 3"  (1.6 m)   Physical Exam HENT:     Head: Normocephalic and atraumatic.     Mouth/Throat:     Comments: Missing teeth Eyes:     Pupils: Pupils are equal, round, and reactive to light.  Neck:     Musculoskeletal: Normal range of motion.  Cardiovascular:     Rate and Rhythm: Normal rate and regular rhythm.     Heart sounds: Normal heart sounds.  Pulmonary:     Effort: Pulmonary effort is normal.     Breath sounds: Normal breath sounds.  Abdominal:     General: Bowel sounds are normal.     Palpations: Abdomen is soft.  Skin:    General: Skin is warm and dry.  Neurological:     Mental Status: She is alert and oriented to person, place, and time.      CMP Latest Ref Rng & Units 09/30/2018  Glucose 70 - 99 mg/dL 110(H)  BUN 8 - 23 mg/dL 12  Creatinine 0.44 - 1.00 mg/dL 0.87  Sodium 135 - 145 mmol/L 144  Potassium 3.5 - 5.1 mmol/L 3.4(L)  Chloride 98 - 111 mmol/L 110  CO2 22 - 32 mmol/L 26  Calcium 8.9 - 10.3 mg/dL 8.5(L)  Total Protein 6.5 - 8.1 g/dL 6.6  Total Bilirubin 0.3 - 1.2 mg/dL 0.6  Alkaline Phos 38 - 126 U/L 229(H)  AST 15 - 41 U/L 20  ALT 0 - 44 U/L 18   CBC Latest Ref Rng & Units 09/30/2018  WBC 4.0 - 10.5 K/uL 11.5(H)  Hemoglobin 12.0 -  15.0 g/dL 8.6(L)  Hematocrit 36.0 - 46.0 % 27.7(L)    Platelets 150 - 400 K/uL 199    No images are attached to the encounter.  Ct Chest W Contrast  Result Date: 09/22/2018 CLINICAL DATA:  Metastatic lung cancer EXAM: CT CHEST, ABDOMEN, AND PELVIS WITH CONTRAST TECHNIQUE: Multidetector CT imaging of the chest, abdomen and pelvis was performed following the standard protocol during bolus administration of intravenous contrast. CONTRAST:  137mL OMNIPAQUE IOHEXOL 300 MG/ML SOLN, additional oral enteric contrast COMPARISON:  PET-CT, 07/15/2018, MR lumbar spine, 07/04/2018 FINDINGS: CT CHEST FINDINGS Cardiovascular: Right chest port catheter. Aortic atherosclerosis. Normal heart size. Three-vessel coronary artery calcifications. No pericardial effusion. Mediastinum/Nodes: There has been marked interval reduction in size of previously seen bulky paratracheal and AP window mediastinal lymph nodes, largest node now measuring 1.5 x 1.0 cm, previously 3.5 x 2.6 cm. Thyroid gland, trachea, and esophagus demonstrate no significant findings. Lungs/Pleura: Interval reduction in size of a primary right upper lobe lung mass, now with a residual measuring 6 mm in size and resolution of adjacent small soft tissue nodules (series 4, image 39). The dominant component on this examination previously measured 1.5 x 1.2 cm on prior PET-CT. Mild centrilobular emphysema. No pleural effusion or pneumothorax. Musculoskeletal: No chest wall mass. CT ABDOMEN PELVIS FINDINGS Hepatobiliary: Coarse contour of the liver. Status post cholecystectomy. Pancreas: Unremarkable. No pancreatic ductal dilatation or surrounding inflammatory changes. Spleen: Splenomegaly, maximum coronal span 14.2 cm. Adrenals/Urinary Tract: Adrenal glands are unremarkable. Kidneys are normal, without renal calculi, solid lesion, or hydronephrosis. Bladder is unremarkable. Stomach/Bowel: Stomach is within normal limits. Appendix is not clearly visualized. No evidence of bowel wall thickening, distention, or inflammatory  changes. Vascular/Lymphatic: Aortic atherosclerosis with focal ectasia of the infrarenal abdominal aorta measuring up to 2.4 cm. No enlarged abdominal or pelvic lymph nodes. Reproductive: No mass or other abnormality. Other: No abdominal wall hernia or abnormality. No abdominopelvic ascites. Musculoskeletal: Redemonstrated extensive sclerotic osseous metastatic disease including involvement of the majority of the vertebral bodies, notable for compression and endplate deformities at T2, L1, L2, and S1, these lesions not significantly changed compared to prior examination. There is new sclerosis of other vertebral bodies, likely reflecting post treatment change. There is extensive osseous metastatic disease elsewhere, including involvement of the bilateral acetabula and proximal femurs as seen on prior PET-CT. There is a new sclerotic lesion of the intratrochanteric left femoral neck (series 2, image 111). IMPRESSION: 1. Interval reduction in size of a primary right upper lobe lung mass, now with a residual measuring 6 mm in size and resolution of adjacent small soft tissue nodules (series 4, image 39). The dominant component on this examination previously measured 1.5 x 1.2 cm on prior PET-CT. There has been marked interval reduction in size of previously seen bulky paratracheal and AP window mediastinal lymph nodes, largest node now measuring 1.5 x 1.0 cm, previously 3.5 x 2.6 cm. Findings are consistent with treatment response. 2. Redemonstrated extensive sclerotic osseous metastatic disease including involvement of the majority of the vertebral bodies, notable for compression and endplate deformities at T2, L1, L2, and S1, these lesions not significantly changed compared to prior examination. There is new sclerosis of other vertebral bodies, likely reflecting post treatment change. There is extensive osseous metastatic disease elsewhere, including involvement of the bilateral acetabula and proximal femurs as seen  on prior PET-CT. There is a new sclerotic lesion of the intratrochanteric left femoral neck (series 2, image 111), which may reflect post treatment change of an  existing lesion although this location is notable for risk of pathologic fracture. Attention on follow-up. 3. Other chronic, incidental, and postoperative non oncologic findings as detailed above. Electronically Signed   By: Eddie Candle M.D.   On: 09/22/2018 08:44   Nm Bone Scan Whole Body  Result Date: 09/22/2018 CLINICAL DATA:  Advanced stage small cell lung cancer of RIGHT upper lobe with bone metastasis, fell 2 weeks ago, BILATERAL knee wounds, LEFT elbow wound and bruising, history cirrhosis, hypertension EXAM: NUCLEAR MEDICINE WHOLE BODY BONE SCAN TECHNIQUE: Whole body anterior and posterior images were obtained approximately 3 hours after intravenous injection of radiopharmaceutical. RADIOPHARMACEUTICALS:  23.454 mCi Technetium-79m MDP IV COMPARISON:  None Imaging correlation: PET-CT 07/15/2018 FINDINGS: Widespread osseous metastases identified. Numerous sites of abnormal tracer accumulation are seen throughout the calvaria, ribs, spine, pelvis, humeri, and femora consistent with osseous metastases. Paucity of urinary tract and soft tissue tracer suggesting super scan appearance. IMPRESSION: Widespread osseous metastatic disease. Sites of abnormal tracer accumulation/metastases are seen within the humeri and femora bilaterally; recommend radiographic correlation to exclude sites at risk for pathologic fracture. Electronically Signed   By: Lavonia Dana M.D.   On: 09/22/2018 12:52   Ct Abdomen Pelvis W Contrast  Result Date: 09/22/2018 CLINICAL DATA:  Metastatic lung cancer EXAM: CT CHEST, ABDOMEN, AND PELVIS WITH CONTRAST TECHNIQUE: Multidetector CT imaging of the chest, abdomen and pelvis was performed following the standard protocol during bolus administration of intravenous contrast. CONTRAST:  149mL OMNIPAQUE IOHEXOL 300 MG/ML SOLN,  additional oral enteric contrast COMPARISON:  PET-CT, 07/15/2018, MR lumbar spine, 07/04/2018 FINDINGS: CT CHEST FINDINGS Cardiovascular: Right chest port catheter. Aortic atherosclerosis. Normal heart size. Three-vessel coronary artery calcifications. No pericardial effusion. Mediastinum/Nodes: There has been marked interval reduction in size of previously seen bulky paratracheal and AP window mediastinal lymph nodes, largest node now measuring 1.5 x 1.0 cm, previously 3.5 x 2.6 cm. Thyroid gland, trachea, and esophagus demonstrate no significant findings. Lungs/Pleura: Interval reduction in size of a primary right upper lobe lung mass, now with a residual measuring 6 mm in size and resolution of adjacent small soft tissue nodules (series 4, image 39). The dominant component on this examination previously measured 1.5 x 1.2 cm on prior PET-CT. Mild centrilobular emphysema. No pleural effusion or pneumothorax. Musculoskeletal: No chest wall mass. CT ABDOMEN PELVIS FINDINGS Hepatobiliary: Coarse contour of the liver. Status post cholecystectomy. Pancreas: Unremarkable. No pancreatic ductal dilatation or surrounding inflammatory changes. Spleen: Splenomegaly, maximum coronal span 14.2 cm. Adrenals/Urinary Tract: Adrenal glands are unremarkable. Kidneys are normal, without renal calculi, solid lesion, or hydronephrosis. Bladder is unremarkable. Stomach/Bowel: Stomach is within normal limits. Appendix is not clearly visualized. No evidence of bowel wall thickening, distention, or inflammatory changes. Vascular/Lymphatic: Aortic atherosclerosis with focal ectasia of the infrarenal abdominal aorta measuring up to 2.4 cm. No enlarged abdominal or pelvic lymph nodes. Reproductive: No mass or other abnormality. Other: No abdominal wall hernia or abnormality. No abdominopelvic ascites. Musculoskeletal: Redemonstrated extensive sclerotic osseous metastatic disease including involvement of the majority of the vertebral bodies,  notable for compression and endplate deformities at T2, L1, L2, and S1, these lesions not significantly changed compared to prior examination. There is new sclerosis of other vertebral bodies, likely reflecting post treatment change. There is extensive osseous metastatic disease elsewhere, including involvement of the bilateral acetabula and proximal femurs as seen on prior PET-CT. There is a new sclerotic lesion of the intratrochanteric left femoral neck (series 2, image 111). IMPRESSION: 1. Interval reduction in size of a  primary right upper lobe lung mass, now with a residual measuring 6 mm in size and resolution of adjacent small soft tissue nodules (series 4, image 39). The dominant component on this examination previously measured 1.5 x 1.2 cm on prior PET-CT. There has been marked interval reduction in size of previously seen bulky paratracheal and AP window mediastinal lymph nodes, largest node now measuring 1.5 x 1.0 cm, previously 3.5 x 2.6 cm. Findings are consistent with treatment response. 2. Redemonstrated extensive sclerotic osseous metastatic disease including involvement of the majority of the vertebral bodies, notable for compression and endplate deformities at T2, L1, L2, and S1, these lesions not significantly changed compared to prior examination. There is new sclerosis of other vertebral bodies, likely reflecting post treatment change. There is extensive osseous metastatic disease elsewhere, including involvement of the bilateral acetabula and proximal femurs as seen on prior PET-CT. There is a new sclerotic lesion of the intratrochanteric left femoral neck (series 2, image 111), which may reflect post treatment change of an existing lesion although this location is notable for risk of pathologic fracture. Attention on follow-up. 3. Other chronic, incidental, and postoperative non oncologic findings as detailed above. Electronically Signed   By: Eddie Candle M.D.   On: 09/22/2018 08:44      Assessment and plan- Patient is a 68 y.o. 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  She will receive etoposide on day 2 and day 3 and on pro Neulasta support on day 3.  I have reviewed CT chest abdomen and pelvis images independently and discussed findings with the patient.  Overall there has been interval treatment response after 2 cycles and reduction in the size of her mediastinal adenopathy as well as Primary lung mass.  Bone lesions are essentially stable.  Plan is to give her 2 more treatments of carboplatin etoposide and Tecentriq followed by repeat scan  Bone metastases: Given involvement of her acetabulum as well as femur and bilateral humerus I will proceed with getting an x-ray of her pelvis/hip as well as bilateral femurs and bilateral humerus at this time.  I will refer her to radiation oncology for palliative radiation thereafter.  Discussed the role of Xgeva for bone metastases and possible risk of osteonecrosis of the jaw.  Patient has few remaining teeth and has not seen a dentist for several years.  He understands the risks versus benefits of proceeding with Delton See and wishes to forego dental evaluation at this time and proceed with Xgeva.  Once it is approved by her insurance we will proceed with Xgeva in the next day or so.  Normocytic anemia: Likely secondary to chemotherapy.  We will check iron studies B12 and folate today.  Repeat CBC with differential in 10 days and hold tube for possible transfusion   Visit Diagnosis 1. Normocytic anemia   2. Encounter for antineoplastic chemotherapy and immunotherapy   3. Small cell lung cancer, right upper lobe ([place])   4. Bone metastases ([place])   5. Anemia due to antineoplastic chemotherapy       Dr. Randa Evens, MD, MPH [facility] at [facility] [phone] 09/30/2018 9:27 AM

## 2018-09-30 NOTE — Progress Notes (Signed)
Appetite not good, things do not taste good, lost wt

## 2018-09-30 NOTE — Progress Notes (Signed)
[place]  Telephone:([phone] [phone]   Name: Anne [name] Date: 09/30/2018 MRN: [id number]  DOB: 12-05-1950  Patient Care Team: Leone Haven, MD as PCP - General (Family Medicine) Caryl Bis Angela Adam, MD as Consulting Physician (Family Medicine) Bary Castilla, Forest Gleason, MD (General Surgery) Telford Nab, RN as Registered Nurse    REASON FOR CONSULTATION: Palliative Care consult requested for this 68 y.o. 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  PMH also notable for hypertension, hyperlipidemia, obesity, cirrhosis and history of tobacco abuse.  MRI of the lumbar spine 07/04/2018 revealed widespread metastatic disease with pathologic fracture of L2 and S1 and a ventral epidural tumor.  Patient is status post palliative XRT to spine.  She is currently on treatment with carboplatin, etoposide, and Tecentriq. She was referred to palliative care to help address goals and manage ongoing symptoms.   SOCIAL HISTORY:     reports that she quit smoking about 6 years ago. Her smoking use included cigarettes. She has a 100.00 pack-year smoking history. She has never used smokeless tobacco. She reports that she does not drink alcohol or use drugs.   Patient is married and lives at home with her husband.  She has two daughters, one of whom lives in [place] and the other in [place], [place].  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  ADVANCE DIRECTIVES:  Does not have  CODE STATUS:   PAST MEDICAL HISTORY: Past Medical History:  Diagnosis Date   Arthritis    Cirrhosis ([place])    Depression    GERD (gastroesophageal reflux disease)    Hyperlipidemia    Hypertension    Lung cancer ([place])    Metastatic bone cancer ([place])     PAST SURGICAL HISTORY:  Past Surgical History:  Procedure Laterality Date    APPENDECTOMY  1971   CHOLECYSTECTOMY  1971   COLONOSCOPY WITH PROPOFOL N/A 11/15/2016   Procedure: COLONOSCOPY WITH PROPOFOL;  Surgeon: Jonathon Bellows, MD;  Location: [facility] ENDOSCOPY;  Service: Gastroenterology;  Laterality: N/A;   ENDOBRONCHIAL ULTRASOUND Right 07/30/2018   Procedure: ENDOBRONCHIAL ULTRASOUND;  Surgeon: Laverle Hobby, MD;  Location: [facility] ORS;  Service: Pulmonary;  Laterality: Right;   ESOPHAGOGASTRODUODENOSCOPY (EGD) WITH PROPOFOL N/A 01/07/2017   Procedure: ESOPHAGOGASTRODUODENOSCOPY (EGD) WITH PROPOFOL;  Surgeon: Jonathon Bellows, MD;  Location: [facility] ENDOSCOPY;  Service: Gastroenterology;  Laterality: N/A;   LAPAROSCOPY N/A 03/01/2017   Procedure: LAPAROSCOPY DIAGNOSTIC;  Surgeon: Robert Bellow, MD;  Location: [facility] ORS;  Service: General;  Laterality: N/A;   PORTA CATH INSERTION N/A 08/14/2018   Procedure: PORTA CATH INSERTION;  Surgeon: Algernon Huxley, MD;  Location: [place] CV LAB;  Service: Cardiovascular;  Laterality: N/A;   TONSILECTOMY, ADENOIDECTOMY, BILATERAL MYRINGOTOMY AND TUBES  1955   TONSILLECTOMY     VENTRAL HERNIA REPAIR N/A 03/01/2017   10 x 14 CM Ventralight ST mesh, intraperitoneal location.    VENTRAL HERNIA REPAIR N/A 03/01/2017   Procedure: HERNIA REPAIR VENTRAL ADULT;  Surgeon: Robert Bellow, MD;  Location: [facility] ORS;  Service: General;  Laterality: N/A;    HEMATOLOGY/ONCOLOGY HISTORY:  Oncology History  Small cell lung cancer, right upper lobe ([place])  08/05/2018 Cancer Staging   Staging form: Lung, AJCC 8th Edition - Clinical stage from 08/05/2018: Stage IVB (cT3, cN2, cM1c) - Signed by Sindy Guadeloupe, MD on 08/06/2018  08/06/2018 Initial Diagnosis   Small cell lung cancer, right upper lobe ([place])   08/18/2018 -  Chemotherapy   The patient had palonosetron (ALOXI) injection 0.25 mg, 0.25 mg, Intravenous,  Once, 3 of 4 cycles Administration: 0.25 mg (08/18/2018), 0.25 mg (09/09/2018) pegfilgrastim (NEULASTA ONPRO KIT) injection 6 mg, 6  mg, Subcutaneous, Once, 3 of 4 cycles Administration: 6 mg (08/20/2018), 6 mg (09/11/2018) CARBOplatin (PARAPLATIN) 460 mg in sodium chloride 0.9 % 250 mL chemo infusion, 460 mg (100 % of original dose 462.5 mg), Intravenous,  Once, 3 of 4 cycles Dose modification:   (original dose 462.5 mg, Cycle 1) Administration: 460 mg (08/18/2018), 460 mg (09/09/2018) etoposide (VEPESID) 190 mg in sodium chloride 0.9 % 500 mL chemo infusion, 100 mg/m2 = 190 mg, Intravenous,  Once, 3 of 4 cycles Administration: 190 mg (08/18/2018), 190 mg (08/19/2018), 190 mg (08/20/2018), 190 mg (09/09/2018), 190 mg (09/10/2018), 190 mg (09/11/2018) fosaprepitant (EMEND) 150 mg, dexamethasone (DECADRON) 12 mg in sodium chloride 0.9 % 145 mL IVPB, , Intravenous,  Once, 3 of 4 cycles Administration:  (08/18/2018),  (09/09/2018) atezolizumab (TECENTRIQ) 1,200 mg in sodium chloride 0.9 % 250 mL chemo infusion, 1,200 mg, Intravenous, Once, 3 of 8 cycles Administration: 1,200 mg (08/18/2018), 1,200 mg (09/09/2018)  for chemotherapy treatment.      ALLERGIES:  is allergic to bee venom.  MEDICATIONS:  Current Outpatient Medications  Medication Sig Dispense Refill   buPROPion (WELLBUTRIN XL) 300 MG 24 hr tablet Take 1 tablet by mouth once daily 90 tablet 0   escitalopram (LEXAPRO) 10 MG tablet Take 1 tablet (10 mg total) by mouth daily. 90 tablet 1   ibuprofen (ADVIL,MOTRIN) 200 MG tablet Take 400 mg by mouth every 8 (eight) hours as needed for headache or moderate pain.      lidocaine-prilocaine (EMLA) cream Apply to affected area once 30 g 3   loperamide (IMODIUM) 2 MG capsule Take 1 capsule (2 mg total) by mouth every 2 (two) hours as needed for diarrhea or loose stools. Do not exceed 56m daily. 30 capsule 1   LORazepam (ATIVAN) 0.5 MG tablet Take 1 tablet (0.5 mg total) by mouth every 6 (six) hours as needed (Nausea or vomiting). 30 tablet 0   Multiple Vitamins-Minerals (PRESERVISION AREDS 2) CAPS Take 1 capsule by mouth 2 (two) times a  day.      ondansetron (ZOFRAN) 8 MG tablet Take 1 tablet (8 mg total) by mouth 2 (two) times daily as needed for refractory nausea / vomiting. Start on day 3 after carboplatin chemo. 30 tablet 1   oxyCODONE (OXY IR/ROXICODONE) 5 MG immediate release tablet Take 1 tablet (5 mg total) by mouth every 6 (six) hours as needed for severe pain. 120 tablet 0   pantoprazole (PROTONIX) 40 MG tablet TAKE 1 TABLET BY MOUTH TWICE DAILY BEFORE A MEAL 180 tablet 0   potassium chloride SA (K-DUR) 20 MEQ tablet Take 1 tablet (20 mEq total) by mouth daily. 10 tablet 0   prochlorperazine (COMPAZINE) 10 MG tablet Take 1 tablet (10 mg total) by mouth every 6 (six) hours as needed (Nausea or vomiting). 30 tablet 1   rosuvastatin (CRESTOR) 20 MG tablet TAKE 1 TABLET BY MOUTH ONCE DAILY 90 tablet 3   No current facility-administered medications for this visit.    Facility-Administered Medications Ordered in Other Visits  Medication Dose Route Frequency Provider Last Rate Last Dose   atezolizumab (TECENTRIQ) 1,200 mg in sodium chloride 0.9 % 250 mL chemo infusion  1,200 mg  Intravenous Once Sindy Guadeloupe, MD 540 mL/hr at 09/30/18 1036 1,200 mg at 09/30/18 1036   CARBOplatin (PARAPLATIN) 460 mg in sodium chloride 0.9 % 250 mL chemo infusion  460 mg Intravenous Once Sindy Guadeloupe, MD       etoposide (VEPESID) 190 mg in sodium chloride 0.9 % 500 mL chemo infusion  100 mg/m2 (Treatment Plan Recorded) Intravenous Once Sindy Guadeloupe, MD       heparin lock flush 100 unit/mL  500 Units Intracatheter Once PRN Sindy Guadeloupe, MD        VITAL SIGNS: There were no vitals taken for this visit. There were no vitals filed for this visit.  Estimated body mass index is 29.71 kg/m as calculated from the following:   Height as of an earlier encounter on 09/30/18: _0  (1.6 m).   Weight as of an earlier encounter on 09/30/18: 167 lb 11.2 oz (76.1 kg).  LABS: CBC:    Component Value Date/Time   WBC 11.5 (H) 09/30/2018  0803   HGB 8.6 (L) 09/30/2018 0803   HCT 27.7 (L) 09/30/2018 0803   PLT 199 09/30/2018 0803   MCV 88.2 09/30/2018 0803   NEUTROABS 8.7 (H) 09/30/2018 0803   LYMPHSABS 1.0 09/30/2018 0803   MONOABS 0.9 09/30/2018 0803   EOSABS 0.0 09/30/2018 0803   BASOSABS 0.1 09/30/2018 0803   Comprehensive Metabolic Panel:    Component Value Date/Time   NA 144 09/30/2018 0803   K 3.4 (L) 09/30/2018 0803   CL 110 09/30/2018 0803   CO2 26 09/30/2018 0803   BUN 12 09/30/2018 0803   CREATININE 0.87 09/30/2018 0803   CREATININE 1.09 02/26/2012 0925   GLUCOSE 110 (H) 09/30/2018 0803   CALCIUM 8.5 (L) 09/30/2018 0803   AST 20 09/30/2018 0803   ALT 18 09/30/2018 0803   ALKPHOS 229 (H) 09/30/2018 0803   BILITOT 0.6 09/30/2018 0803   PROT 6.6 09/30/2018 0803   ALBUMIN 3.6 09/30/2018 0803    RADIOGRAPHIC STUDIES: Ct Chest W Contrast  Result Date: 09/22/2018 CLINICAL DATA:  Metastatic lung cancer EXAM: CT CHEST, ABDOMEN, AND PELVIS WITH CONTRAST TECHNIQUE: Multidetector CT imaging of the chest, abdomen and pelvis was performed following the standard protocol during bolus administration of intravenous contrast. CONTRAST:  168m OMNIPAQUE IOHEXOL 300 MG/ML SOLN, additional oral enteric contrast COMPARISON:  PET-CT, 07/15/2018, MR lumbar spine, 07/04/2018 FINDINGS: CT CHEST FINDINGS Cardiovascular: Right chest port catheter. Aortic atherosclerosis. Normal heart size. Three-vessel coronary artery calcifications. No pericardial effusion. Mediastinum/Nodes: There has been marked interval reduction in size of previously seen bulky paratracheal and AP window mediastinal lymph nodes, largest node now measuring 1.5 x 1.0 cm, previously 3.5 x 2.6 cm. Thyroid gland, trachea, and esophagus demonstrate no significant findings. Lungs/Pleura: Interval reduction in size of a primary right upper lobe lung mass, now with a residual measuring 6 mm in size and resolution of adjacent small soft tissue nodules (series 4, image 39).  The dominant component on this examination previously measured 1.5 x 1.2 cm on prior PET-CT. Mild centrilobular emphysema. No pleural effusion or pneumothorax. Musculoskeletal: No chest wall mass. CT ABDOMEN PELVIS FINDINGS Hepatobiliary: Coarse contour of the liver. Status post cholecystectomy. Pancreas: Unremarkable. No pancreatic ductal dilatation or surrounding inflammatory changes. Spleen: Splenomegaly, maximum coronal span 14.2 cm. Adrenals/Urinary Tract: Adrenal glands are unremarkable. Kidneys are normal, without renal calculi, solid lesion, or hydronephrosis. Bladder is unremarkable. Stomach/Bowel: Stomach is within normal limits. Appendix is not clearly visualized. No evidence of bowel wall thickening,  distention, or inflammatory changes. Vascular/Lymphatic: Aortic atherosclerosis with focal ectasia of the infrarenal abdominal aorta measuring up to 2.4 cm. No enlarged abdominal or pelvic lymph nodes. Reproductive: No mass or other abnormality. Other: No abdominal wall hernia or abnormality. No abdominopelvic ascites. Musculoskeletal: Redemonstrated extensive sclerotic osseous metastatic disease including involvement of the majority of the vertebral bodies, notable for compression and endplate deformities at T2, L1, L2, and S1, these lesions not significantly changed compared to prior examination. There is new sclerosis of other vertebral bodies, likely reflecting post treatment change. There is extensive osseous metastatic disease elsewhere, including involvement of the bilateral acetabula and proximal femurs as seen on prior PET-CT. There is a new sclerotic lesion of the intratrochanteric left femoral neck (series 2, image 111). IMPRESSION: 1. Interval reduction in size of a primary right upper lobe lung mass, now with a residual measuring 6 mm in size and resolution of adjacent small soft tissue nodules (series 4, image 39). The dominant component on this examination previously measured 1.5 x 1.2 cm on  prior PET-CT. There has been marked interval reduction in size of previously seen bulky paratracheal and AP window mediastinal lymph nodes, largest node now measuring 1.5 x 1.0 cm, previously 3.5 x 2.6 cm. Findings are consistent with treatment response. 2. Redemonstrated extensive sclerotic osseous metastatic disease including involvement of the majority of the vertebral bodies, notable for compression and endplate deformities at T2, L1, L2, and S1, these lesions not significantly changed compared to prior examination. There is new sclerosis of other vertebral bodies, likely reflecting post treatment change. There is extensive osseous metastatic disease elsewhere, including involvement of the bilateral acetabula and proximal femurs as seen on prior PET-CT. There is a new sclerotic lesion of the intratrochanteric left femoral neck (series 2, image 111), which may reflect post treatment change of an existing lesion although this location is notable for risk of pathologic fracture. Attention on follow-up. 3. Other chronic, incidental, and postoperative non oncologic findings as detailed above. Electronically Signed   By: Eddie Candle M.D.   On: 09/22/2018 08:44   Nm Bone Scan Whole Body  Result Date: 09/22/2018 CLINICAL DATA:  Advanced stage small cell lung cancer of RIGHT upper lobe with bone metastasis, fell 2 weeks ago, BILATERAL knee wounds, LEFT elbow wound and bruising, history cirrhosis, hypertension EXAM: NUCLEAR MEDICINE WHOLE BODY BONE SCAN TECHNIQUE: Whole body anterior and posterior images were obtained approximately 3 hours after intravenous injection of radiopharmaceutical. RADIOPHARMACEUTICALS:  23.454 mCi Technetium-52mMDP IV COMPARISON:  None Imaging correlation: PET-CT 07/15/2018 FINDINGS: Widespread osseous metastases identified. Numerous sites of abnormal tracer accumulation are seen throughout the calvaria, ribs, spine, pelvis, humeri, and femora consistent with osseous metastases. Paucity of  urinary tract and soft tissue tracer suggesting super scan appearance. IMPRESSION: Widespread osseous metastatic disease. Sites of abnormal tracer accumulation/metastases are seen within the humeri and femora bilaterally; recommend radiographic correlation to exclude sites at risk for pathologic fracture. Electronically Signed   By: MLavonia DanaM.D.   On: 09/22/2018 12:52   Ct Abdomen Pelvis W Contrast  Result Date: 09/22/2018 CLINICAL DATA:  Metastatic lung cancer EXAM: CT CHEST, ABDOMEN, AND PELVIS WITH CONTRAST TECHNIQUE: Multidetector CT imaging of the chest, abdomen and pelvis was performed following the standard protocol during bolus administration of intravenous contrast. CONTRAST:  1050mOMNIPAQUE IOHEXOL 300 MG/ML SOLN, additional oral enteric contrast COMPARISON:  PET-CT, 07/15/2018, MR lumbar spine, 07/04/2018 FINDINGS: CT CHEST FINDINGS Cardiovascular: Right chest port catheter. Aortic atherosclerosis. Normal heart size. Three-vessel coronary  artery calcifications. No pericardial effusion. Mediastinum/Nodes: There has been marked interval reduction in size of previously seen bulky paratracheal and AP window mediastinal lymph nodes, largest node now measuring 1.5 x 1.0 cm, previously 3.5 x 2.6 cm. Thyroid gland, trachea, and esophagus demonstrate no significant findings. Lungs/Pleura: Interval reduction in size of a primary right upper lobe lung mass, now with a residual measuring 6 mm in size and resolution of adjacent small soft tissue nodules (series 4, image 39). The dominant component on this examination previously measured 1.5 x 1.2 cm on prior PET-CT. Mild centrilobular emphysema. No pleural effusion or pneumothorax. Musculoskeletal: No chest wall mass. CT ABDOMEN PELVIS FINDINGS Hepatobiliary: Coarse contour of the liver. Status post cholecystectomy. Pancreas: Unremarkable. No pancreatic ductal dilatation or surrounding inflammatory changes. Spleen: Splenomegaly, maximum coronal span 14.2 cm.  Adrenals/Urinary Tract: Adrenal glands are unremarkable. Kidneys are normal, without renal calculi, solid lesion, or hydronephrosis. Bladder is unremarkable. Stomach/Bowel: Stomach is within normal limits. Appendix is not clearly visualized. No evidence of bowel wall thickening, distention, or inflammatory changes. Vascular/Lymphatic: Aortic atherosclerosis with focal ectasia of the infrarenal abdominal aorta measuring up to 2.4 cm. No enlarged abdominal or pelvic lymph nodes. Reproductive: No mass or other abnormality. Other: No abdominal wall hernia or abnormality. No abdominopelvic ascites. Musculoskeletal: Redemonstrated extensive sclerotic osseous metastatic disease including involvement of the majority of the vertebral bodies, notable for compression and endplate deformities at T2, L1, L2, and S1, these lesions not significantly changed compared to prior examination. There is new sclerosis of other vertebral bodies, likely reflecting post treatment change. There is extensive osseous metastatic disease elsewhere, including involvement of the bilateral acetabula and proximal femurs as seen on prior PET-CT. There is a new sclerotic lesion of the intratrochanteric left femoral neck (series 2, image 111). IMPRESSION: 1. Interval reduction in size of a primary right upper lobe lung mass, now with a residual measuring 6 mm in size and resolution of adjacent small soft tissue nodules (series 4, image 39). The dominant component on this examination previously measured 1.5 x 1.2 cm on prior PET-CT. There has been marked interval reduction in size of previously seen bulky paratracheal and AP window mediastinal lymph nodes, largest node now measuring 1.5 x 1.0 cm, previously 3.5 x 2.6 cm. Findings are consistent with treatment response. 2. Redemonstrated extensive sclerotic osseous metastatic disease including involvement of the majority of the vertebral bodies, notable for compression and endplate deformities at T2, L1,  L2, and S1, these lesions not significantly changed compared to prior examination. There is new sclerosis of other vertebral bodies, likely reflecting post treatment change. There is extensive osseous metastatic disease elsewhere, including involvement of the bilateral acetabula and proximal femurs as seen on prior PET-CT. There is a new sclerotic lesion of the intratrochanteric left femoral neck (series 2, image 111), which may reflect post treatment change of an existing lesion although this location is notable for risk of pathologic fracture. Attention on follow-up. 3. Other chronic, incidental, and postoperative non oncologic findings as detailed above. Electronically Signed   By: Eddie Candle M.D.   On: 09/22/2018 08:44    PERFORMANCE STATUS (ECOG) : 1 - Symptomatic but completely ambulatory  Review of Systems Unless otherwise noted, a complete review of systems is negative.  Physical Exam General: NAD, frail appearing Pulmonary: Unlabored Extremities: no edema Skin: no rashes Neurological: Weakness but otherwise nonfocal  IMPRESSION: Routine follow-up visit today.  Patient was seen in the infusion area.  Patient denies any distressing symptoms.  She  denies changes in performance status.  She says that her oral intake has been chronically poor.  Patient does have evidence of weight loss and is down to 167 pounds.  Discussed initiation of oral supplements.  Will refer to RD.  Patient has financial concerns and would like to speak to a Education officer, museum.  Will refer to Barnabas Lister, SW.  Patient says that she received "bad news" from Dr. Janese Banks today regarding the cancer but did not elucidate.  Patient says she does not feel like talking about it and has a lot on her mind.  Patient tells me she is "not done fighting" and says she needs to live for her grandchildren.  I had previously sent her home with a MOST Form and patient says that she has not yet had a chance to discuss it with her  husband.   PLAN: -Continue current scope of treatment -Referral RD -Referral FW -Discussed ACP/MOST form -RTC in 3 weeks   Patient expressed understanding and was in agreement with this plan. She also understands that She can call the clinic at any time with any questions, concerns, or complaints.     Time Total: 20 minutes  Visit consisted of counseling and education dealing with the complex and emotionally intense issues of symptom management and palliative care in the setting of serious and potentially life-threatening illness.Greater than 50%  of this time was spent counseling and coordinating care related to the above assessment and plan.  Signed by: Altha Harm, PhD, NP-C [phone] (Work Cell)

## 2018-10-01 ENCOUNTER — Inpatient Hospital Stay: Payer: PPO

## 2018-10-01 ENCOUNTER — Other Ambulatory Visit: Payer: Self-pay

## 2018-10-01 VITALS — BP 137/74 | HR 79 | Temp 97.9°F | Resp 18

## 2018-10-01 DIAGNOSIS — Z5111 Encounter for antineoplastic chemotherapy: Secondary | ICD-10-CM | POA: Diagnosis not present

## 2018-10-01 DIAGNOSIS — C3411 Malignant neoplasm of upper lobe, right bronchus or lung: Secondary | ICD-10-CM

## 2018-10-01 DIAGNOSIS — C7951 Secondary malignant neoplasm of bone: Secondary | ICD-10-CM

## 2018-10-01 DIAGNOSIS — D649 Anemia, unspecified: Secondary | ICD-10-CM

## 2018-10-01 LAB — IRON AND TIBC
Iron: 179 ug/dL — ABNORMAL HIGH (ref 28–170)
Saturation Ratios: 78 % — ABNORMAL HIGH (ref 10.4–31.8)
TIBC: 230 ug/dL — ABNORMAL LOW (ref 250–450)
UIBC: 51 ug/dL

## 2018-10-01 LAB — FERRITIN: Ferritin: 194 ng/mL (ref 11–307)

## 2018-10-01 LAB — VITAMIN B12: Vitamin B-12: 1235 pg/mL — ABNORMAL HIGH (ref 180–914)

## 2018-10-01 LAB — FOLATE: Folate: 3.4 ng/mL — ABNORMAL LOW (ref >5.9)

## 2018-10-01 MED ORDER — DENOSUMAB 120 MG/1.7ML ~~LOC~~ SOLN
120.0000 mg | SUBCUTANEOUS | Status: DC
  Administered 2018-10-01: 120 mg via SUBCUTANEOUS
  Filled 2018-10-01: qty 1.7

## 2018-10-01 MED ORDER — SODIUM CHLORIDE 0.9 % IV SOLN
100.0000 mg/m2 | Freq: Once | INTRAVENOUS | Status: AC
  Administered 2018-10-01: 190 mg via INTRAVENOUS
  Filled 2018-10-01: qty 9.5

## 2018-10-01 MED ORDER — HEPARIN SOD (PORK) LOCK FLUSH 100 UNIT/ML IV SOLN
500.0000 [IU] | Freq: Once | INTRAVENOUS | Status: AC | PRN
  Administered 2018-10-01: 500 [IU]
  Filled 2018-10-01 (×2): qty 5

## 2018-10-01 MED ORDER — DEXAMETHASONE SODIUM PHOSPHATE 10 MG/ML IJ SOLN
10.0000 mg | Freq: Once | INTRAMUSCULAR | Status: AC
  Administered 2018-10-01: 10 mg via INTRAVENOUS
  Filled 2018-10-01: qty 1

## 2018-10-01 MED ORDER — SODIUM CHLORIDE 0.9 % IV SOLN
Freq: Once | INTRAVENOUS | Status: AC
  Administered 2018-10-01: 13:00:00 via INTRAVENOUS
  Filled 2018-10-01: qty 250

## 2018-10-01 NOTE — Progress Notes (Signed)
PSN had spoken with patient about financial assistance on August 11, 2018.  At that time, PSN spoke with her about financial assistance through [facility] and assistance through the cancer center funds.  At that time, patient did not want any assistance.  PSN spoke with patient today and went over financial assistance options, again.  Patient stated that she would call PSN if needs arose.

## 2018-10-02 ENCOUNTER — Telehealth: Payer: Self-pay | Admitting: *Deleted

## 2018-10-02 ENCOUNTER — Inpatient Hospital Stay: Payer: PPO

## 2018-10-02 ENCOUNTER — Other Ambulatory Visit: Payer: Self-pay

## 2018-10-02 VITALS — BP 111/60 | HR 73 | Temp 97.8°F | Resp 20

## 2018-10-02 DIAGNOSIS — C3411 Malignant neoplasm of upper lobe, right bronchus or lung: Secondary | ICD-10-CM

## 2018-10-02 DIAGNOSIS — Z5111 Encounter for antineoplastic chemotherapy: Secondary | ICD-10-CM | POA: Diagnosis not present

## 2018-10-02 LAB — ALKALINE PHOSPHATASE, ISOENZYMES
Alk Phos Bone Fract: 74 % — ABNORMAL HIGH (ref 14–68)
Alk Phos Liver Fract: 20 % (ref 18–85)
Alk Phos: 257 IU/L — ABNORMAL HIGH (ref 39–117)
Intestinal %: 6 % (ref 0–18)

## 2018-10-02 MED ORDER — SODIUM CHLORIDE 0.9 % IV SOLN
100.0000 mg/m2 | Freq: Once | INTRAVENOUS | Status: AC
  Administered 2018-10-02: 190 mg via INTRAVENOUS
  Filled 2018-10-02: qty 9.5

## 2018-10-02 MED ORDER — HEPARIN SOD (PORK) LOCK FLUSH 100 UNIT/ML IV SOLN
500.0000 [IU] | Freq: Once | INTRAVENOUS | Status: AC | PRN
  Administered 2018-10-02: 500 [IU]
  Filled 2018-10-02: qty 5

## 2018-10-02 MED ORDER — DEXAMETHASONE SODIUM PHOSPHATE 10 MG/ML IJ SOLN
10.0000 mg | Freq: Once | INTRAMUSCULAR | Status: AC
  Administered 2018-10-02: 10 mg via INTRAVENOUS
  Filled 2018-10-02: qty 1

## 2018-10-02 MED ORDER — FOLIC ACID 1 MG PO TABS: 1.0000 mg | ORAL_TABLET | Freq: Every day | ORAL | 3 refills | Status: DC

## 2018-10-02 MED ORDER — SODIUM CHLORIDE 0.9 % IV SOLN
Freq: Once | INTRAVENOUS | Status: AC
  Administered 2018-10-02: 13:00:00 via INTRAVENOUS
  Filled 2018-10-02: qty 250

## 2018-10-02 MED ORDER — PEGFILGRASTIM 6 MG/0.6ML ~~LOC~~ PSKT
6.0000 mg | PREFILLED_SYRINGE | Freq: Once | SUBCUTANEOUS | Status: AC
  Administered 2018-10-02: 6 mg via SUBCUTANEOUS
  Filled 2018-10-02: qty 0.6

## 2018-10-02 MED ORDER — SODIUM CHLORIDE 0.9% FLUSH
10.0000 mL | INTRAVENOUS | Status: DC | PRN
  Administered 2018-10-02: 10 mL
  Filled 2018-10-02: qty 10

## 2018-10-02 NOTE — Telephone Encounter (Signed)
Called home and cell phone of pt and no answer and no voicemail to leave a message. I called her daughter.-Krystal. She gets her mom's meds because she works in the pharmacy. She saw it came through and she will let her mom know. She also states that I can leave my chart  Messages. She checks the messages and tells her mom

## 2018-10-02 NOTE — Telephone Encounter (Signed)
-----   Message from Sindy Guadeloupe, MD sent at 10/02/2018  8:19 AM EDT ----- Please send her a prescription for folate 1 mg daily. Thanks, Astrid Divine

## 2018-10-06 ENCOUNTER — Encounter: Payer: Self-pay | Admitting: Oncology

## 2018-10-10 ENCOUNTER — Other Ambulatory Visit: Payer: Self-pay

## 2018-10-10 ENCOUNTER — Inpatient Hospital Stay: Payer: PPO | Attending: Oncology

## 2018-10-10 ENCOUNTER — Inpatient Hospital Stay: Payer: PPO

## 2018-10-10 ENCOUNTER — Other Ambulatory Visit: Payer: Self-pay | Admitting: *Deleted

## 2018-10-10 ENCOUNTER — Encounter: Payer: Self-pay | Admitting: *Deleted

## 2018-10-10 DIAGNOSIS — D6481 Anemia due to antineoplastic chemotherapy: Secondary | ICD-10-CM | POA: Insufficient documentation

## 2018-10-10 DIAGNOSIS — C3411 Malignant neoplasm of upper lobe, right bronchus or lung: Secondary | ICD-10-CM | POA: Insufficient documentation

## 2018-10-10 DIAGNOSIS — Z5111 Encounter for antineoplastic chemotherapy: Secondary | ICD-10-CM | POA: Insufficient documentation

## 2018-10-10 DIAGNOSIS — Z79899 Other long term (current) drug therapy: Secondary | ICD-10-CM | POA: Insufficient documentation

## 2018-10-10 DIAGNOSIS — I10 Essential (primary) hypertension: Secondary | ICD-10-CM | POA: Diagnosis not present

## 2018-10-10 DIAGNOSIS — T451X5A Adverse effect of antineoplastic and immunosuppressive drugs, initial encounter: Secondary | ICD-10-CM | POA: Insufficient documentation

## 2018-10-10 DIAGNOSIS — R531 Weakness: Secondary | ICD-10-CM | POA: Insufficient documentation

## 2018-10-10 DIAGNOSIS — Z5112 Encounter for antineoplastic immunotherapy: Secondary | ICD-10-CM | POA: Insufficient documentation

## 2018-10-10 DIAGNOSIS — C7951 Secondary malignant neoplasm of bone: Secondary | ICD-10-CM | POA: Diagnosis not present

## 2018-10-10 DIAGNOSIS — R109 Unspecified abdominal pain: Secondary | ICD-10-CM | POA: Insufficient documentation

## 2018-10-10 DIAGNOSIS — R5383 Other fatigue: Secondary | ICD-10-CM | POA: Insufficient documentation

## 2018-10-10 DIAGNOSIS — Z87891 Personal history of nicotine dependence: Secondary | ICD-10-CM | POA: Diagnosis not present

## 2018-10-10 DIAGNOSIS — K219 Gastro-esophageal reflux disease without esophagitis: Secondary | ICD-10-CM | POA: Insufficient documentation

## 2018-10-10 DIAGNOSIS — E875 Hyperkalemia: Secondary | ICD-10-CM | POA: Diagnosis not present

## 2018-10-10 DIAGNOSIS — R7989 Other specified abnormal findings of blood chemistry: Secondary | ICD-10-CM | POA: Diagnosis not present

## 2018-10-10 DIAGNOSIS — R112 Nausea with vomiting, unspecified: Secondary | ICD-10-CM | POA: Diagnosis not present

## 2018-10-10 DIAGNOSIS — Z8249 Family history of ischemic heart disease and other diseases of the circulatory system: Secondary | ICD-10-CM | POA: Diagnosis not present

## 2018-10-10 DIAGNOSIS — R000 Tachycardia, unspecified: Secondary | ICD-10-CM | POA: Insufficient documentation

## 2018-10-10 DIAGNOSIS — Z452 Encounter for adjustment and management of vascular access device: Secondary | ICD-10-CM | POA: Insufficient documentation

## 2018-10-10 DIAGNOSIS — E785 Hyperlipidemia, unspecified: Secondary | ICD-10-CM | POA: Diagnosis not present

## 2018-10-10 DIAGNOSIS — Z5189 Encounter for other specified aftercare: Secondary | ICD-10-CM | POA: Diagnosis not present

## 2018-10-10 LAB — COMPREHENSIVE METABOLIC PANEL
ALT: 26 U/L (ref 0–44)
AST: 24 U/L (ref 15–41)
Albumin: 3.9 g/dL (ref 3.5–5.0)
Alkaline Phosphatase: 152 U/L — ABNORMAL HIGH (ref 38–126)
Anion gap: 8 (ref 5–15)
BUN: 7 mg/dL — ABNORMAL LOW (ref 8–23)
CO2: 24 mmol/L (ref 22–32)
Calcium: 6.6 mg/dL — ABNORMAL LOW (ref 8.9–10.3)
Chloride: 106 mmol/L (ref 98–111)
Creatinine, Ser: 0.57 mg/dL (ref 0.44–1.00)
GFR calc Af Amer: >60 mL/min (ref >60)
GFR calc non Af Amer: >60 mL/min (ref >60)
Glucose, Bld: 100 mg/dL — ABNORMAL HIGH (ref 70–99)
Potassium: 3.3 mmol/L — ABNORMAL LOW (ref 3.5–5.1)
Sodium: 138 mmol/L (ref 135–145)
Total Bilirubin: 0.9 mg/dL (ref 0.3–1.2)
Total Protein: 6.8 g/dL (ref 6.5–8.1)

## 2018-10-10 LAB — CBC WITH DIFFERENTIAL/PLATELET
Abs Immature Granulocytes: 0.06 10*3/uL (ref 0.00–0.07)
Basophils Absolute: 0 10*3/uL (ref 0.0–0.1)
Basophils Relative: 2 %
Eosinophils Absolute: 0 10*3/uL (ref 0.0–0.5)
Eosinophils Relative: 0 %
HCT: 23.6 % — ABNORMAL LOW (ref 36.0–46.0)
Hemoglobin: 7.4 g/dL — ABNORMAL LOW (ref 12.0–15.0)
Immature Granulocytes: 5 %
Lymphocytes Relative: 27 %
Lymphs Abs: 0.4 10*3/uL — ABNORMAL LOW (ref 0.7–4.0)
MCH: 26.9 pg (ref 26.0–34.0)
MCHC: 31.4 g/dL (ref 30.0–36.0)
MCV: 85.8 fL (ref 80.0–100.0)
Monocytes Absolute: 0.2 10*3/uL (ref 0.1–1.0)
Monocytes Relative: 12 %
Neutro Abs: 0.7 10*3/uL — ABNORMAL LOW (ref 1.7–7.7)
Neutrophils Relative %: 54 %
Platelets: 52 10*3/uL — ABNORMAL LOW (ref 150–400)
RBC: 2.75 MIL/uL — ABNORMAL LOW (ref 3.87–5.11)
RDW: 20.5 % — ABNORMAL HIGH (ref 11.5–15.5)
Smear Review: NORMAL
WBC: 1.3 10*3/uL — CL (ref 4.0–10.5)
nRBC: 0 % (ref 0.0–0.2)

## 2018-10-10 LAB — SAMPLE TO BLOOD BANK

## 2018-10-10 MED ORDER — POTASSIUM CHLORIDE CRYS ER 20 MEQ PO TBCR: 20.0000 meq | EXTENDED_RELEASE_TABLET | Freq: Every day | ORAL | 0 refills | Status: DC

## 2018-10-10 MED ORDER — HEPARIN SOD (PORK) LOCK FLUSH 100 UNIT/ML IV SOLN
500.0000 [IU] | Freq: Once | INTRAVENOUS | Status: AC
  Administered 2018-10-10: 10:00:00 500 [IU] via INTRAVENOUS
  Filled 2018-10-10: qty 5

## 2018-10-10 NOTE — Progress Notes (Signed)
Per Dr. Janese Banks no blood transfusion is needed today. Patient verbalizes understanding. Port deaccessed.

## 2018-10-15 ENCOUNTER — Other Ambulatory Visit: Payer: Self-pay

## 2018-10-15 ENCOUNTER — Emergency Department
Admission: EM | Admit: 2018-10-15 | Discharge: 2018-10-15 | Disposition: A | Discharge status: Home / Self Care | Payer: PPO | Attending: Emergency Medicine | Admitting: Emergency Medicine

## 2018-10-15 ENCOUNTER — Ambulatory Visit: Payer: Self-pay | Admitting: *Deleted

## 2018-10-15 ENCOUNTER — Emergency Department: Payer: PPO

## 2018-10-15 DIAGNOSIS — E876 Hypokalemia: Secondary | ICD-10-CM | POA: Insufficient documentation

## 2018-10-15 DIAGNOSIS — I10 Essential (primary) hypertension: Secondary | ICD-10-CM | POA: Diagnosis not present

## 2018-10-15 DIAGNOSIS — C7951 Secondary malignant neoplasm of bone: Secondary | ICD-10-CM | POA: Insufficient documentation

## 2018-10-15 DIAGNOSIS — C3400 Malignant neoplasm of unspecified main bronchus: Secondary | ICD-10-CM | POA: Diagnosis not present

## 2018-10-15 DIAGNOSIS — R531 Weakness: Secondary | ICD-10-CM | POA: Diagnosis not present

## 2018-10-15 DIAGNOSIS — R29810 Facial weakness: Secondary | ICD-10-CM | POA: Diagnosis not present

## 2018-10-15 DIAGNOSIS — Z87891 Personal history of nicotine dependence: Secondary | ICD-10-CM | POA: Diagnosis not present

## 2018-10-15 DIAGNOSIS — R4020 Unspecified coma: Secondary | ICD-10-CM | POA: Diagnosis not present

## 2018-10-15 LAB — URINALYSIS, COMPLETE (UACMP) WITH MICROSCOPIC
Bilirubin Urine: NEGATIVE
Glucose, UA: NEGATIVE mg/dL
Hgb urine dipstick: NEGATIVE
Ketones, ur: NEGATIVE mg/dL
Nitrite: NEGATIVE
Protein, ur: NEGATIVE mg/dL
Specific Gravity, Urine: 1.011 (ref 1.005–1.030)
pH: 8 (ref 5.0–8.0)

## 2018-10-15 LAB — TROPONIN I (HIGH SENSITIVITY): Troponin I (High Sensitivity): 5 ng/L

## 2018-10-15 LAB — CBC WITH DIFFERENTIAL/PLATELET
Abs Immature Granulocytes: 4.33 10*3/uL — ABNORMAL HIGH (ref 0.00–0.07)
Basophils Absolute: 0.2 10*3/uL — ABNORMAL HIGH (ref 0.0–0.1)
Basophils Relative: 1 %
Eosinophils Absolute: 0 10*3/uL (ref 0.0–0.5)
Eosinophils Relative: 0 %
HCT: 26.8 % — ABNORMAL LOW (ref 36.0–46.0)
Hemoglobin: 8.5 g/dL — ABNORMAL LOW (ref 12.0–15.0)
Immature Granulocytes: 26 %
Lymphocytes Relative: 6 %
Lymphs Abs: 1 10*3/uL (ref 0.7–4.0)
MCH: 27.6 pg (ref 26.0–34.0)
MCHC: 31.7 g/dL (ref 30.0–36.0)
MCV: 87 fL (ref 80.0–100.0)
Monocytes Absolute: 1.2 10*3/uL — ABNORMAL HIGH (ref 0.1–1.0)
Monocytes Relative: 7 %
Neutro Abs: 9.7 10*3/uL — ABNORMAL HIGH (ref 1.7–7.7)
Neutrophils Relative %: 60 %
Platelets: UNDETERMINED 10*3/uL (ref 150–400)
RBC: 3.08 MIL/uL — ABNORMAL LOW (ref 3.87–5.11)
RDW: 22.9 % — ABNORMAL HIGH (ref 11.5–15.5)
Smear Review: UNDETERMINED
WBC: 16.4 10*3/uL — ABNORMAL HIGH (ref 4.0–10.5)
nRBC: 0.8 % — ABNORMAL HIGH (ref 0.0–0.2)

## 2018-10-15 LAB — COMPREHENSIVE METABOLIC PANEL
ALT: 16 U/L (ref 0–44)
AST: 18 U/L (ref 15–41)
Albumin: 3.9 g/dL (ref 3.5–5.0)
Alkaline Phosphatase: 149 U/L — ABNORMAL HIGH (ref 38–126)
Anion gap: 13 (ref 5–15)
BUN: 8 mg/dL (ref 8–23)
CO2: 24 mmol/L (ref 22–32)
Calcium: 6.7 mg/dL — ABNORMAL LOW (ref 8.9–10.3)
Chloride: 106 mmol/L (ref 98–111)
Creatinine, Ser: 0.79 mg/dL (ref 0.44–1.00)
GFR calc Af Amer: >60 mL/min (ref >60)
GFR calc non Af Amer: >60 mL/min (ref >60)
Glucose, Bld: 95 mg/dL (ref 70–99)
Potassium: 3 mmol/L — ABNORMAL LOW (ref 3.5–5.1)
Sodium: 143 mmol/L (ref 135–145)
Total Bilirubin: 0.9 mg/dL (ref 0.3–1.2)
Total Protein: 7 g/dL (ref 6.5–8.1)

## 2018-10-15 LAB — PATHOLOGIST SMEAR REVIEW

## 2018-10-15 LAB — MAGNESIUM: Magnesium: 1.4 mg/dL — ABNORMAL LOW (ref 1.7–2.4)

## 2018-10-15 MED ORDER — KCL IN DEXTROSE-NACL 20-5-0.45 MEQ/L-%-% IV SOLN
INTRAVENOUS | Status: DC
  Administered 2018-10-15: 12:00:00 via INTRAVENOUS
  Filled 2018-10-15: qty 1000

## 2018-10-15 MED ORDER — SODIUM CHLORIDE 0.9 % IV SOLN
1.0000 g | Freq: Once | INTRAVENOUS | Status: AC
  Administered 2018-10-15: 12:00:00 1 g via INTRAVENOUS
  Filled 2018-10-15: qty 10

## 2018-10-15 MED ORDER — POTASSIUM CHLORIDE CRYS ER 20 MEQ PO TBCR
40.0000 meq | EXTENDED_RELEASE_TABLET | Freq: Once | ORAL | Status: AC
  Administered 2018-10-15: 12:00:00 40 meq via ORAL
  Filled 2018-10-15: qty 2

## 2018-10-15 MED ORDER — SODIUM CHLORIDE 0.9 % IV SOLN
1.0000 g | Freq: Once | INTRAVENOUS | Status: AC
  Administered 2018-10-15: 1 g via INTRAVENOUS
  Filled 2018-10-15: qty 10

## 2018-10-15 MED ORDER — MAGNESIUM SULFATE 2 GM/50ML IV SOLN
2.0000 g | Freq: Once | INTRAVENOUS | Status: AC
  Administered 2018-10-15: 14:00:00 2 g via INTRAVENOUS
  Filled 2018-10-15: qty 50

## 2018-10-15 NOTE — ED Notes (Signed)
Pt assisted to bathroom.  Pt missed hat with urine.  Had bowel movement.

## 2018-10-15 NOTE — Telephone Encounter (Signed)
Pt's daughter called stating that the pt is having numbness all over her body, her hands have palsy, and speech is mumbled and she can not move her lips; this started about 20 min ago; both sides of her mouth are drooping; recommendations made per nurse triage protocol; call transferred to [place], operator # [phone]; the pt sees Dr Caryl Bis, [facility]; will route to office for notification.  Reason for Disposition . [1] Numbness (i.e., loss of sensation) of the face, arm / hand, or leg / foot on one side of the body AND [2] sudden onset AND [3] present now  Answer Assessment - Initial Assessment Questions 1. SYMPTOM: "What is the main symptom you are concerned about?" (e.g., weakness, numbness)     Facial droop 2. ONSET: "When did this start?" (minutes, hours, days; while sleeping)     10/15/2018 at 0950 3. LAST NORMAL: "When was the last time you were normal (no symptoms)?"    0950 4. PATTERN "Does this come and go, or has it been constant since it started?"  "Is it present now?"    constant 5. CARDIAC SYMPTOMS: "Have you had any of the following symptoms: chest pain, difficulty breathing, palpitations?"    no 6. NEUROLOGIC SYMPTOMS: "Have you had any of the following symptoms: headache, dizziness, vision loss, double vision, changes in speech, unsteady on your feet?"     Tingling and numbness all over body 7. OTHER SYMPTOMS: "Do you have any other symptoms?"    no 8. PREGNANCY: "Is there any chance you are pregnant?" "When was your last menstrual period?"     no  Protocols used: NEUROLOGIC DEFICIT-A-AH

## 2018-10-15 NOTE — ED Notes (Signed)
Assisted pt to bathroom. NAD. Family remains at bedside.

## 2018-10-15 NOTE — ED Triage Notes (Signed)
Pt arrives via EMS, was driving and had to pull over due to weakness in both lower and upper extremities. Pt reports tingling in BUE now.

## 2018-10-15 NOTE — ED Notes (Addendum)
Pt aware of need for urine specimen. Pt given water, ok per dr Jimmye Norman

## 2018-10-15 NOTE — ED Notes (Signed)
Patient transported to CT 

## 2018-10-15 NOTE — Telephone Encounter (Signed)
Currently in the ED.

## 2018-10-15 NOTE — Telephone Encounter (Signed)
FYI

## 2018-10-15 NOTE — ED Provider Notes (Signed)
[facility] Emergency Department Provider Note       Time seen: ----------------------------------------- 10:45 AM on 10/15/2018 -----------------------------------------   I have reviewed the triage vital signs and the nursing notes.  HISTORY   Chief Complaint Weakness    HPI Anne [name] is a 68 y.o. female with a history of arthritis, cirrhosis, depression, GERD, hyperlipidemia, hypertension, lung cancer, metastatic bone cancer who presents to the ER for numbness and tingling in her hands and feet.  Patient reports she was driving and she had to pull over due to the weakness.  She was not having any pain.  She does have a history of metastatic cancer and is on chemotherapy.  Past Medical History:  Diagnosis Date  . Arthritis   . Cirrhosis ([place])   . Depression   . GERD (gastroesophageal reflux disease)   . Hyperlipidemia   . Hypertension   . Lung cancer ([place])   . Metastatic bone cancer [facility])     Patient Active Problem List   Diagnosis Date Noted  . Goals of care, counseling/discussion 08/06/2018  . Small cell lung cancer, right upper lobe ([place]) 08/06/2018  . Bone metastases ([place]) 08/06/2018  . Bleeding nose 02/26/2018  . Vision changes 02/26/2018  . Skin tear of forearm without complication, initial encounter 12/06/2017  . Bruising 12/06/2017  . Obesity (BMI 30.0-34.9) 08/05/2017  . Ventral hernia 03/01/2017  . Abdominal wall hernia 02/15/2017  . Lung nodule < 6cm on CT 02/15/2017  . Anemia 01/23/2017  . Cirrhosis of liver ([place]) 01/23/2017  . Gastritis 01/23/2017  . Chest pain 01/03/2017  . Hypokalemia 01/03/2017  . Weight loss 12/13/2016  . Callus of foot 07/27/2016  . Heart murmur 07/27/2016  . Right calf pain 04/20/2016  . Chronic low back pain 08/15/2015  . Right shoulder pain 06/14/2015  . Benign essential HTN 02/22/2015  . HLD (hyperlipidemia) 02/22/2015  . Depression 02/22/2015    Past Surgical History:  Procedure  Laterality Date  . APPENDECTOMY  1971  . CHOLECYSTECTOMY  1971  . COLONOSCOPY WITH PROPOFOL N/A 11/15/2016   Procedure: COLONOSCOPY WITH PROPOFOL;  Surgeon: Jonathon Bellows, MD;  Location: [facility] ENDOSCOPY;  Service: Gastroenterology;  Laterality: N/A;  . ENDOBRONCHIAL ULTRASOUND Right 07/30/2018   Procedure: ENDOBRONCHIAL ULTRASOUND;  Surgeon: Laverle Hobby, MD;  Location: [facility] ORS;  Service: Pulmonary;  Laterality: Right;  . ESOPHAGOGASTRODUODENOSCOPY (EGD) WITH PROPOFOL N/A 01/07/2017   Procedure: ESOPHAGOGASTRODUODENOSCOPY (EGD) WITH PROPOFOL;  Surgeon: Jonathon Bellows, MD;  Location: [facility] ENDOSCOPY;  Service: Gastroenterology;  Laterality: N/A;  . LAPAROSCOPY N/A 03/01/2017   Procedure: LAPAROSCOPY DIAGNOSTIC;  Surgeon: Robert Bellow, MD;  Location: [facility] ORS;  Service: General;  Laterality: N/A;  . PORTA CATH INSERTION N/A 08/14/2018   Procedure: PORTA CATH INSERTION;  Surgeon: Algernon Huxley, MD;  Location: [place] CV LAB;  Service: Cardiovascular;  Laterality: N/A;  . TONSILECTOMY, ADENOIDECTOMY, BILATERAL MYRINGOTOMY [place]  . TONSILLECTOMY    . VENTRAL HERNIA REPAIR N/A 03/01/2017   10 x 14 CM Ventralight ST mesh, intraperitoneal location.   . VENTRAL HERNIA REPAIR N/A 03/01/2017   Procedure: HERNIA REPAIR VENTRAL ADULT;  Surgeon: Robert Bellow, MD;  Location: [facility] ORS;  Service: General;  Laterality: N/A;    Allergies Bee venom  Social History Social History   Tobacco Use  . Smoking status: Former Smoker    Packs/day: 2.00    Years: 50.00    Pack years: 100.00    Types: Cigarettes    Quit  date: 02/07/2012    Years since quitting: 6.6  . Smokeless tobacco: Never Used  . Tobacco comment: uses vape without nicotine, one capsule every 3 months.  Substance Use Topics  . Alcohol use: No    Alcohol/week: 0.0 standard drinks  . Drug use: No   Review of Systems Constitutional: Negative for fever. Cardiovascular: Negative for chest pain. Respiratory: Negative  for shortness of breath. Gastrointestinal: Negative for abdominal pain, vomiting and diarrhea. Musculoskeletal: Negative for back pain. Skin: Negative for rash. Neurological: Positive for numbness and weakness  All systems negative/normal/unremarkable except as stated in the HPI  ____________________________________________   PHYSICAL EXAM:  VITAL SIGNS: ED Triage Vitals  Enc Vitals Group     BP 10/15/18 1042 123/70     Pulse Rate 10/15/18 1042 90     Resp 10/15/18 1042 15     Temp 10/15/18 1042 98.1 F (36.7 C)     Temp Source 10/15/18 1042 Oral     SpO2 10/15/18 1042 100 %     Weight 10/15/18 1040 162 lb (73.5 kg)     Height 10/15/18 1040 5\' 4"  (1.626 m)     Head Circumference --      Peak Flow --      Pain Score 10/15/18 1039 0     Pain Loc --      Pain Edu? --      Excl. in [place]? --    Constitutional: Alert and oriented. Well appearing and in no distress. Eyes: Conjunctivae are normal. Normal extraocular movements. ENT      Head: Normocephalic and atraumatic.      Nose: No congestion/rhinnorhea.      Mouth/Throat: Mucous membranes are moist.      Neck: No stridor. Cardiovascular: Normal rate, regular rhythm. No murmurs, rubs, or gallops. Respiratory: Normal respiratory effort without tachypnea nor retractions. Breath sounds are clear and equal bilaterally. No wheezes/rales/rhonchi. Gastrointestinal: Soft and nontender. Normal bowel sounds Musculoskeletal: Nontender with normal range of motion in extremities. No lower extremity tenderness nor edema. Neurologic:  Normal speech and language. No gross focal neurologic deficits are appreciated.  Paresthesias are noted in the hands and feet, otherwise neurologically intact Skin:  Skin is warm, dry and intact. No rash noted. Psychiatric: Mood and affect are normal. Speech and behavior are normal.  ____________________________________________  EKG: Interpreted by me.  Sinus rhythm with rate of 92 bpm, normal axis, long QT,  no ST elevation  ____________________________________________  ED COURSE:  As part of my medical decision making, I reviewed the following data within the [place] History obtained from family if available, nursing notes, old chart and ekg, as well as notes from prior ED visits. Patient presented for paresthesias and weakness, we will assess with labs and imaging as indicated at this time. Clinical Course as of Oct 15 1447  Wed Oct 15, 2018  1308 Potassium(!): 3.0 [JW]  1308 These will be repleted via IV.  Calcium(!): 6.7 [JW]  3474 NEUT#: PENDING [JW]    Clinical Course User Index [JW] Earleen Newport, MD   Procedures  Acelyn Basham Smaltz was evaluated in Emergency Department on 10/15/2018 for the symptoms described in the history of present illness. She was evaluated in the context of the global COVID-19 pandemic, which necessitated consideration that the patient might be at risk for infection with the SARS-CoV-2 virus that causes COVID-19. Institutional protocols and algorithms that pertain to the evaluation of patients at risk for COVID-19 are in a state  of rapid change based on information released by regulatory bodies including the CDC and federal and state organizations. These policies and algorithms were followed during the patient's care in the ED.  ____________________________________________   LABS (pertinent positives/negatives)  Labs Reviewed  COMPREHENSIVE METABOLIC PANEL - Abnormal; Notable for the following components:      Result Value   Potassium 3.0 (*)    Calcium 6.7 (*)    Alkaline Phosphatase 149 (*)    All other components within normal limits  CBC WITH DIFFERENTIAL/PLATELET - Abnormal; Notable for the following components:   WBC 16.4 (*)    RBC 3.08 (*)    Hemoglobin 8.5 (*)    HCT 26.8 (*)    RDW 22.9 (*)    nRBC 0.8 (*)    Neutro Abs 9.7 (*)    Monocytes Absolute 1.2 (*)    Basophils Absolute 0.2 (*)    Abs Immature Granulocytes  4.33 (*)    All other components within normal limits  MAGNESIUM - Abnormal; Notable for the following components:   Magnesium 1.4 (*)    All other components within normal limits  URINALYSIS, COMPLETE (UACMP) WITH MICROSCOPIC  PATHOLOGIST SMEAR REVIEW  TROPONIN I (HIGH SENSITIVITY)    RADIOLOGY Images were viewed by me  CT head IMPRESSION: No acute intracranial pathology. ____________________________________________   DIFFERENTIAL DIAGNOSIS   Dehydration, electrolyte abnormality, metastatic cancer, anxiety  FINAL ASSESSMENT AND PLAN  Paresthesias, hypocalcemia, hypokalemia, hypomagnesemia, thrombocytopenia   Plan: The patient had presented for paresthesias and weakness. Patient's labs revealed numerous electrolyte abnormalities including low potassium, low magnesium and low calcium.  These were all repleted mostly IV but some orally as well.  She was given 2 g of calcium, 2 g of magnesium and IV and oral potassium. Patient's imaging not reveal any acute process.  I have discussed with oncology who will see her this week for recheck.  Patient is agreeable to plan.   Laurence Aly, MD    Note: This note was generated in part or whole with voice recognition software. Voice recognition is usually quite accurate but there are transcription errors that can and very often do occur. I apologize for any typographical errors that were not detected and corrected.     Earleen Newport, MD 10/15/18 1450

## 2018-10-16 ENCOUNTER — Other Ambulatory Visit: Payer: Self-pay

## 2018-10-16 ENCOUNTER — Inpatient Hospital Stay: Payer: PPO

## 2018-10-16 ENCOUNTER — Other Ambulatory Visit: Payer: Self-pay | Admitting: *Deleted

## 2018-10-16 ENCOUNTER — Encounter: Payer: Self-pay | Admitting: Oncology

## 2018-10-16 NOTE — Progress Notes (Signed)
Called patient no answer unable to leave message 

## 2018-10-16 NOTE — Progress Notes (Signed)
Nutrition Assessment   Reason for Assessment:  Referral from [place], NP, weight loss   ASSESSMENT:  68 year old female with stage IV lung cancer with metastatic disease to bone. S/p palliative radiation to spine.  Past medical history of arthritis, cirrhosis, depression, GERD, HLD, HTN.  Patient currently receiving carboplatin, etoposide and tecentriq.  Noted visit to ED yesterday.   Spoke with patient via phone this am for nutrition assessment.  Patient reports that she has no appetite and taste alterations.  "Everything taste like cardboard." Reports that she can taste sweeter foods. Patient reports she has never liked breakfast or eaten breakfast. Sometimes snacks during the day, on chips per patient but does not even want those at this time.  Reports ate good supper last night (chicken sandwich and few waffle fries).  Can't really describe a typical day for RD.  Reports that she has tried boost/ensure shakes and is able to drink them (sometimes 1 a day).      Nutrition Focused Physical Exam: deferred   Medications: folic acid, ativan, MVI, zofran, protonix, KCL, compazine   Labs: K 3.0, calcium 6.7, Mag 1.4 (supplemented in ED)   Anthropometrics:   Height: 64 inches Weight: 162 lb 9/9 ED weight UBW: > 200 lb per patient before she found out she had cancer. (May 2020) Per chart weight 02/26/2018 195 lb BMI: 27  17% weight loss in the last 7 months   Estimated Energy Needs  Kcals: 2200-2500 Protein: 110-125 g Fluid: > 2.2L   NUTRITION DIAGNOSIS: Inadequate oral intake related to cancer related treatment side effects as evidenced by 17% weight loss in 7 months   INTERVENTION:  Discussed strategies to help with taste change and will mail handout.   Encouraged high calorie oral nutrition supplement shakes at least 2 times per day.   Encouraged small frequent snacks during the day. Patient may be a candidate for appetite stimulant Contact information mailed to patient as  well.     MONITORING, EVALUATION, GOAL: Patient will consume adequate calories to prevent weight from decreasing and maintain lean muscle mass   Next Visit: Sept 17 during infusion  Rhenda [place] B. Zenia Resides, [place], [place] Registered Dietitian [phone] (pager)

## 2018-10-17 ENCOUNTER — Encounter: Payer: Self-pay | Admitting: *Deleted

## 2018-10-17 ENCOUNTER — Inpatient Hospital Stay: Payer: PPO

## 2018-10-17 ENCOUNTER — Other Ambulatory Visit: Payer: Self-pay

## 2018-10-17 ENCOUNTER — Inpatient Hospital Stay (HOSPITAL_BASED_OUTPATIENT_CLINIC_OR_DEPARTMENT_OTHER): Payer: PPO | Admitting: Oncology

## 2018-10-17 DIAGNOSIS — E876 Hypokalemia: Secondary | ICD-10-CM

## 2018-10-17 DIAGNOSIS — Z5111 Encounter for antineoplastic chemotherapy: Secondary | ICD-10-CM | POA: Diagnosis not present

## 2018-10-17 DIAGNOSIS — R7989 Other specified abnormal findings of blood chemistry: Secondary | ICD-10-CM | POA: Diagnosis not present

## 2018-10-17 DIAGNOSIS — E785 Hyperlipidemia, unspecified: Secondary | ICD-10-CM | POA: Diagnosis not present

## 2018-10-17 DIAGNOSIS — Z452 Encounter for adjustment and management of vascular access device: Secondary | ICD-10-CM | POA: Diagnosis not present

## 2018-10-17 DIAGNOSIS — C3411 Malignant neoplasm of upper lobe, right bronchus or lung: Secondary | ICD-10-CM

## 2018-10-17 DIAGNOSIS — Z5112 Encounter for antineoplastic immunotherapy: Secondary | ICD-10-CM | POA: Diagnosis not present

## 2018-10-17 DIAGNOSIS — R000 Tachycardia, unspecified: Secondary | ICD-10-CM | POA: Diagnosis not present

## 2018-10-17 DIAGNOSIS — K219 Gastro-esophageal reflux disease without esophagitis: Secondary | ICD-10-CM | POA: Diagnosis not present

## 2018-10-17 DIAGNOSIS — E875 Hyperkalemia: Secondary | ICD-10-CM | POA: Diagnosis not present

## 2018-10-17 DIAGNOSIS — R531 Weakness: Secondary | ICD-10-CM | POA: Diagnosis not present

## 2018-10-17 DIAGNOSIS — D6481 Anemia due to antineoplastic chemotherapy: Secondary | ICD-10-CM | POA: Diagnosis not present

## 2018-10-17 DIAGNOSIS — Z8249 Family history of ischemic heart disease and other diseases of the circulatory system: Secondary | ICD-10-CM | POA: Diagnosis not present

## 2018-10-17 DIAGNOSIS — Z5189 Encounter for other specified aftercare: Secondary | ICD-10-CM | POA: Diagnosis not present

## 2018-10-17 DIAGNOSIS — R109 Unspecified abdominal pain: Secondary | ICD-10-CM | POA: Diagnosis not present

## 2018-10-17 DIAGNOSIS — Z79899 Other long term (current) drug therapy: Secondary | ICD-10-CM | POA: Diagnosis not present

## 2018-10-17 DIAGNOSIS — R5383 Other fatigue: Secondary | ICD-10-CM | POA: Diagnosis not present

## 2018-10-17 DIAGNOSIS — C7951 Secondary malignant neoplasm of bone: Secondary | ICD-10-CM | POA: Diagnosis not present

## 2018-10-17 DIAGNOSIS — Z87891 Personal history of nicotine dependence: Secondary | ICD-10-CM | POA: Diagnosis not present

## 2018-10-17 DIAGNOSIS — R112 Nausea with vomiting, unspecified: Secondary | ICD-10-CM | POA: Diagnosis not present

## 2018-10-17 DIAGNOSIS — I10 Essential (primary) hypertension: Secondary | ICD-10-CM | POA: Diagnosis not present

## 2018-10-17 DIAGNOSIS — T451X5A Adverse effect of antineoplastic and immunosuppressive drugs, initial encounter: Secondary | ICD-10-CM | POA: Diagnosis not present

## 2018-10-17 LAB — COMPREHENSIVE METABOLIC PANEL
ALT: 14 U/L (ref 0–44)
AST: 20 U/L (ref 15–41)
Albumin: 3.9 g/dL (ref 3.5–5.0)
Alkaline Phosphatase: 139 U/L — ABNORMAL HIGH (ref 38–126)
Anion gap: 10 (ref 5–15)
BUN: 8 mg/dL (ref 8–23)
CO2: 24 mmol/L (ref 22–32)
Calcium: 6.4 mg/dL — CL (ref 8.9–10.3)
Chloride: 104 mmol/L (ref 98–111)
Creatinine, Ser: 0.66 mg/dL (ref 0.44–1.00)
GFR calc Af Amer: >60 mL/min (ref >60)
GFR calc non Af Amer: >60 mL/min (ref >60)
Glucose, Bld: 126 mg/dL — ABNORMAL HIGH (ref 70–99)
Potassium: 2.8 mmol/L — ABNORMAL LOW (ref 3.5–5.1)
Sodium: 138 mmol/L (ref 135–145)
Total Bilirubin: 0.8 mg/dL (ref 0.3–1.2)
Total Protein: 6.5 g/dL (ref 6.5–8.1)

## 2018-10-17 LAB — URINE CULTURE
Culture: <10000 — AB
Special Requests: NORMAL

## 2018-10-17 LAB — MAGNESIUM: Magnesium: 1.4 mg/dL — ABNORMAL LOW (ref 1.7–2.4)

## 2018-10-17 MED ORDER — SODIUM CHLORIDE 0.9 % IV SOLN
INTRAVENOUS | Status: DC
  Administered 2018-10-17: 11:00:00 via INTRAVENOUS
  Filled 2018-10-17 (×2): qty 1000

## 2018-10-17 MED ORDER — SODIUM CHLORIDE 0.9 % IV SOLN: 10.0000 mg | Freq: Once | INTRAVENOUS | Status: DC

## 2018-10-17 MED ORDER — SODIUM CHLORIDE 0.9 % IV SOLN: Freq: Once | INTRAVENOUS | Status: DC

## 2018-10-17 MED ORDER — DEXAMETHASONE SODIUM PHOSPHATE 10 MG/ML IJ SOLN
10.0000 mg | Freq: Once | INTRAMUSCULAR | Status: AC
  Administered 2018-10-17: 10 mg via INTRAVENOUS
  Filled 2018-10-17: qty 1

## 2018-10-17 MED ORDER — POTASSIUM CHLORIDE CRYS ER 20 MEQ PO TBCR: 20.0000 meq | EXTENDED_RELEASE_TABLET | Freq: Two times a day (BID) | ORAL | 0 refills | Status: DC

## 2018-10-17 MED ORDER — SODIUM CHLORIDE 0.9 % IV SOLN
Freq: Once | INTRAVENOUS | Status: AC
  Administered 2018-10-17: 09:00:00 via INTRAVENOUS
  Filled 2018-10-17: qty 250

## 2018-10-17 MED ORDER — HEPARIN SOD (PORK) LOCK FLUSH 100 UNIT/ML IV SOLN
500.0000 [IU] | Freq: Once | INTRAVENOUS | Status: AC
  Administered 2018-10-17: 500 [IU]
  Filled 2018-10-17: qty 5

## 2018-10-17 NOTE — Progress Notes (Signed)
No appetite, eats a little something. Has a coke a day, then drinks 4-5 glasses of tea. She has not ate this morning.she says they gave her fluids in ER but did not make her feels better

## 2018-10-19 NOTE — Progress Notes (Signed)
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 She has been referred to [place] for findings of bone metastases and her recent MRI. She has a prior history of 3 packs/day day smoking for over 45 years and quit smoking 5 years ago.She had a CT chest abdomen pelvis in 2018 which showed a 5 mm lung nodule in the left lower lobe. Recently over the last 2 months patient has been having worsening back pain and was referred to orthopedics. 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 Pathologic fracture of S1.  PET scan showed 2 RUL lung nodules, hilar and mediastinal adenopathy and widespread bone mets. MRI brain negative.  Patientcompletedpalliative RT to her spine. Bronchoscopy showed small cell lung cancer. Palliative carboplatin, etoposide and Tecentriq started on 08/18/2018  Interval history-patient went to the ER for muscle cramps and was noted to have significant hypokalemia and hypocalcemia for which she received IV infusion and was discharged the same day.  Currently patient reports feeling well.  Her appetite is poor but denies any pain anywhere.  Denies any muscle cramps  at this time  ECOG PS- 1 Pain scale- 0  Review of systems- Review of Systems  Constitutional: Positive for malaise/fatigue. Negative for chills, fever and weight loss.       Lack of appetite  HENT: Negative for congestion, ear discharge and nosebleeds.   Eyes: Negative for blurred vision.  Respiratory: Negative for cough, hemoptysis, sputum production, shortness of breath and wheezing.   Cardiovascular: Negative for chest pain, palpitations, orthopnea and claudication.  Gastrointestinal: Negative for abdominal pain, blood in stool, constipation, diarrhea, heartburn, melena, nausea and vomiting.  Genitourinary: Negative for dysuria, flank pain, frequency, hematuria and urgency.  Musculoskeletal: Negative for back pain, joint pain and myalgias.  Skin: Negative for rash.  Neurological: Negative for dizziness, tingling, focal weakness, seizures, weakness and headaches.  Endo/Heme/Allergies: Does not bruise/bleed easily.  Psychiatric/Behavioral: Negative for depression and suicidal ideas. The patient does not have insomnia.       Allergies  Allergen Reactions   Bee Venom Swelling     Past Medical History:  Diagnosis Date   Arthritis    Cirrhosis ([place])    Depression    GERD (gastroesophageal reflux disease)    Hyperlipidemia    Hypertension    Lung cancer ([place])    Metastatic bone cancer ([place])      Past Surgical History:  Procedure Laterality Date   APPENDECTOMY  1971   CHOLECYSTECTOMY  1971   COLONOSCOPY WITH PROPOFOL N/A 11/15/2016   Procedure: COLONOSCOPY WITH PROPOFOL;  Surgeon: Jonathon Bellows, MD;  Location: [facility] ENDOSCOPY;  Service: Gastroenterology;  Laterality: N/A;   ENDOBRONCHIAL ULTRASOUND Right 07/30/2018   Procedure: ENDOBRONCHIAL ULTRASOUND;  Surgeon: Laverle Hobby, MD;  Location: [facility] ORS;  Service: Pulmonary;  Laterality: Right;   ESOPHAGOGASTRODUODENOSCOPY (EGD) WITH PROPOFOL N/A 01/07/2017   Procedure: ESOPHAGOGASTRODUODENOSCOPY (EGD) WITH  PROPOFOL;  Surgeon: Jonathon Bellows, MD;  Location: [facility] ENDOSCOPY;  Service: Gastroenterology;  Laterality: N/A;   LAPAROSCOPY N/A 03/01/2017   Procedure: LAPAROSCOPY DIAGNOSTIC;  Surgeon: Robert Bellow, MD;  Location: [facility] ORS;  Service: General;  Laterality: N/A;   PORTA CATH INSERTION N/A 08/14/2018   Procedure: PORTA CATH INSERTION;  Surgeon: Algernon Huxley, MD;  Location: [place] CV LAB;  Service: Cardiovascular;  Laterality: N/A;   TONSILECTOMY, ADENOIDECTOMY, BILATERAL MYRINGOTOMY AND TUBES  1955   TONSILLECTOMY     VENTRAL HERNIA REPAIR N/A 03/01/2017   10 x 14 CM Ventralight ST mesh, intraperitoneal location.    VENTRAL HERNIA REPAIR N/A 03/01/2017   Procedure: HERNIA REPAIR VENTRAL ADULT;  Surgeon: Robert Bellow, MD;  Location: [facility] ORS;  Service: General;  Laterality: N/A;    Social History   Socioeconomic History   Marital status: Married    Spouse name: Johnny   Number of children: Not on file   Years of education: Not on file   Highest education level: Not on file  Occupational History   Not on file  Social Needs   Financial resource strain: Not hard at all   Food insecurity    Worry: Never true    Inability: Never true   Transportation needs    Medical: No    Non-medical: No  Tobacco Use   Smoking status: Former Smoker    Packs/day: 2.00    Years: 50.00    Pack years: 100.00    Types: Cigarettes    Quit date: 02/07/2012    Years since quitting: 6.7   Smokeless tobacco: Never Used   Tobacco comment: uses vape without nicotine, one capsule every 3 months.  Substance and Sexual Activity   Alcohol use: No    Alcohol/week: 0.0 standard drinks   Drug use: No   Sexual activity: Yes  Lifestyle   Physical activity    Days per week: Not on file    Minutes per session: Not on file   Stress: Not at all  Relationships   Social connections    Talks on phone: Not on file    Gets together: Not on file    Attends religious service: Not  on file    Active member of club or organization: Not on file    Attends meetings of clubs or organizations: Not on file    Relationship status: Not on file   Intimate partner violence    Fear of current or ex partner: No    Emotionally abused: No    Physically abused: No    Forced sexual activity: No  Other Topics Concern   Not on file  Social History Narrative   Married   Retired   Clinical cytogeneticist level of education    No children    1 cup of coffee    Family History  Problem Relation Age of Onset   Hypertension Mother    Ovarian cancer Mother 13   Heart disease Father    Stroke Father    Ovarian cancer Sister        ? dx cancer had hyst.   Breast cancer Neg Hx      Current Outpatient Medications:    buPROPion (WELLBUTRIN XL) 300 MG 24 hr tablet, Take 1 tablet by mouth once daily, Disp: 90 tablet, Rfl: 0   escitalopram (LEXAPRO) 10 MG  tablet, Take 1 tablet (10 mg total) by mouth daily., Disp: 90 tablet, Rfl: 1   folic acid (FOLVITE) 1 MG tablet, Take 1 tablet (1 mg total) by mouth daily., Disp: 30 tablet, Rfl: 3   ibuprofen (ADVIL,MOTRIN) 200 MG tablet, Take 400 mg by mouth every 8 (eight) hours as needed for headache or moderate pain. , Disp: , Rfl:    lidocaine-prilocaine (EMLA) cream, Apply to affected area once, Disp: 30 g, Rfl: 3   loperamide (IMODIUM) 2 MG capsule, Take 1 capsule (2 mg total) by mouth every 2 (two) hours as needed for diarrhea or loose stools. Do not exceed 16mg  daily., Disp: 30 capsule, Rfl: 1   LORazepam (ATIVAN) 0.5 MG tablet, Take 1 tablet (0.5 mg total) by mouth every 6 (six) hours as needed (Nausea or vomiting)., Disp: 30 tablet, Rfl: 0   Multiple Vitamins-Minerals (PRESERVISION AREDS 2) CAPS, Take 1 capsule by mouth 2 (two) times a day. , Disp: , Rfl:    ondansetron (ZOFRAN) 8 MG tablet, Take 1 tablet (8 mg total) by mouth 2 (two) times daily as needed for refractory nausea / vomiting. Start on day 3 after carboplatin  chemo., Disp: 30 tablet, Rfl: 1   oxyCODONE (OXY IR/ROXICODONE) 5 MG immediate release tablet, Take 1 tablet (5 mg total) by mouth every 6 (six) hours as needed for severe pain., Disp: 120 tablet, Rfl: 0   pantoprazole (PROTONIX) 40 MG tablet, TAKE 1 TABLET BY MOUTH TWICE DAILY BEFORE A MEAL, Disp: 180 tablet, Rfl: 0   potassium chloride SA (K-DUR) 20 MEQ tablet, Take 1 tablet (20 mEq total) by mouth 2 (two) times daily., Disp: 20 tablet, Rfl: 0   prochlorperazine (COMPAZINE) 10 MG tablet, Take 1 tablet (10 mg total) by mouth every 6 (six) hours as needed (Nausea or vomiting)., Disp: 30 tablet, Rfl: 1   rosuvastatin (CRESTOR) 20 MG tablet, TAKE 1 TABLET BY MOUTH ONCE DAILY, Disp: 90 tablet, Rfl: 3 No current facility-administered medications for this visit.   Facility-Administered Medications Ordered in Other Visits:    sodium chloride 0.9 % 1,000 mL with potassium chloride 20 mEq, magnesium sulfate 2 g, calcium gluconate 2 g infusion, , Intravenous, Continuous, Verlon Au, NP, Stopped at 10/17/18 1201  Physical exam:  Vitals:   10/17/18 0837  BP: 104/60  Pulse: 91  Resp: 16  Temp: 99 F (37.2 C)  TempSrc: Tympanic  Weight: 164 lb (74.4 kg)  Height: 5\' 4"  (1.626 m)   Physical Exam HENT:     Head: Normocephalic and atraumatic.  Eyes:     Pupils: Pupils are equal, round, and reactive to light.  Neck:     Musculoskeletal: Normal range of motion.  Cardiovascular:     Rate and Rhythm: Normal rate and regular rhythm.     Heart sounds: Normal heart sounds.  Pulmonary:     Effort: Pulmonary effort is normal.     Breath sounds: Normal breath sounds.  Abdominal:     General: Bowel sounds are normal.     Palpations: Abdomen is soft.  Skin:    General: Skin is warm and dry.  Neurological:     Mental Status: She is alert and oriented to person, place, and time.      CMP Latest Ref Rng & Units 10/17/2018  Glucose 70 - 99 mg/dL 126(H)  BUN 8 - 23 mg/dL 8  Creatinine 0.44 -  1.00 mg/dL 0.66  Sodium 135 - 145 mmol/L 138  Potassium 3.5 - 5.1 mmol/L  2.8(L)  Chloride 98 - 111 mmol/L 104  CO2 22 - 32 mmol/L 24  Calcium 8.9 - 10.3 mg/dL 6.4(LL)  Total Protein 6.5 - 8.1 g/dL 6.5  Total Bilirubin 0.3 - 1.2 mg/dL 0.8  Alkaline Phos 38 - 126 U/L 139(H)  AST 15 - 41 U/L 20  ALT 0 - 44 U/L 14   CBC Latest Ref Rng & Units 10/15/2018  WBC 4.0 - 10.5 K/uL 16.4(H)  Hemoglobin 12.0 - 15.0 g/dL 8.5(L)  Hematocrit 36.0 - 46.0 % 26.8(L)  Platelets 150 - 400 K/uL PLATELET CLUMPS NOTED ON SMEAR, UNABLE TO ESTIMATE    No images are attached to the encounter.  Ct Head Wo Contrast  Result Date: 10/15/2018 CLINICAL DATA:  Altered level of consciousness EXAM: CT HEAD WITHOUT CONTRAST TECHNIQUE: Contiguous axial images were obtained from the base of the skull through the vertex without intravenous contrast. COMPARISON:  None. FINDINGS: Brain: No evidence of acute infarction, hemorrhage, extra-axial collection, ventriculomegaly, or mass effect. Generalized cerebral atrophy. Periventricular white matter low attenuation likely secondary to microangiopathy. Vascular: Cerebrovascular atherosclerotic calcifications are noted. Skull: Negative for fracture or focal lesion. Sinuses/Orbits: Visualized portions of the orbits are unremarkable. Visualized portions of the paranasal sinuses and mastoid air cells are unremarkable. Other: None. IMPRESSION: No acute intracranial pathology. Electronically Signed   By: Kathreen Devoid   On: 10/15/2018 11:28   Ct Chest W Contrast  Result Date: 09/22/2018 CLINICAL DATA:  Metastatic lung cancer EXAM: CT CHEST, ABDOMEN, AND PELVIS WITH CONTRAST TECHNIQUE: Multidetector CT imaging of the chest, abdomen and pelvis was performed following the standard protocol during bolus administration of intravenous contrast. CONTRAST:  17mL OMNIPAQUE IOHEXOL 300 MG/ML SOLN, additional oral enteric contrast COMPARISON:  PET-CT, 07/15/2018, MR lumbar spine, 07/04/2018 FINDINGS: CT  CHEST FINDINGS Cardiovascular: Right chest port catheter. Aortic atherosclerosis. Normal heart size. Three-vessel coronary artery calcifications. No pericardial effusion. Mediastinum/Nodes: There has been marked interval reduction in size of previously seen bulky paratracheal and AP window mediastinal lymph nodes, largest node now measuring 1.5 x 1.0 cm, previously 3.5 x 2.6 cm. Thyroid gland, trachea, and esophagus demonstrate no significant findings. Lungs/Pleura: Interval reduction in size of a primary right upper lobe lung mass, now with a residual measuring 6 mm in size and resolution of adjacent small soft tissue nodules (series 4, image 39). The dominant component on this examination previously measured 1.5 x 1.2 cm on prior PET-CT. Mild centrilobular emphysema. No pleural effusion or pneumothorax. Musculoskeletal: No chest wall mass. CT ABDOMEN PELVIS FINDINGS Hepatobiliary: Coarse contour of the liver. Status post cholecystectomy. Pancreas: Unremarkable. No pancreatic ductal dilatation or surrounding inflammatory changes. Spleen: Splenomegaly, maximum coronal span 14.2 cm. Adrenals/Urinary Tract: Adrenal glands are unremarkable. Kidneys are normal, without renal calculi, solid lesion, or hydronephrosis. Bladder is unremarkable. Stomach/Bowel: Stomach is within normal limits. Appendix is not clearly visualized. No evidence of bowel wall thickening, distention, or inflammatory changes. Vascular/Lymphatic: Aortic atherosclerosis with focal ectasia of the infrarenal abdominal aorta measuring up to 2.4 cm. No enlarged abdominal or pelvic lymph nodes. Reproductive: No mass or other abnormality. Other: No abdominal wall hernia or abnormality. No abdominopelvic ascites. Musculoskeletal: Redemonstrated extensive sclerotic osseous metastatic disease including involvement of the majority of the vertebral bodies, notable for compression and endplate deformities at T2, L1, L2, and S1, these lesions not significantly  changed compared to prior examination. There is new sclerosis of other vertebral bodies, likely reflecting post treatment change. There is extensive osseous metastatic disease elsewhere, including involvement of the bilateral acetabula  and proximal femurs as seen on prior PET-CT. There is a new sclerotic lesion of the intratrochanteric left femoral neck (series 2, image 111). IMPRESSION: 1. Interval reduction in size of a primary right upper lobe lung mass, now with a residual measuring 6 mm in size and resolution of adjacent small soft tissue nodules (series 4, image 39). The dominant component on this examination previously measured 1.5 x 1.2 cm on prior PET-CT. There has been marked interval reduction in size of previously seen bulky paratracheal and AP window mediastinal lymph nodes, largest node now measuring 1.5 x 1.0 cm, previously 3.5 x 2.6 cm. Findings are consistent with treatment response. 2. Redemonstrated extensive sclerotic osseous metastatic disease including involvement of the majority of the vertebral bodies, notable for compression and endplate deformities at T2, L1, L2, and S1, these lesions not significantly changed compared to prior examination. There is new sclerosis of other vertebral bodies, likely reflecting post treatment change. There is extensive osseous metastatic disease elsewhere, including involvement of the bilateral acetabula and proximal femurs as seen on prior PET-CT. There is a new sclerotic lesion of the intratrochanteric left femoral neck (series 2, image 111), which may reflect post treatment change of an existing lesion although this location is notable for risk of pathologic fracture. Attention on follow-up. 3. Other chronic, incidental, and postoperative non oncologic findings as detailed above. Electronically Signed   By: Eddie Candle M.D.   On: 09/22/2018 08:44   Nm Bone Scan Whole Body  Result Date: 09/22/2018 CLINICAL DATA:  Advanced stage small cell lung cancer of  RIGHT upper lobe with bone metastasis, fell 2 weeks ago, BILATERAL knee wounds, LEFT elbow wound and bruising, history cirrhosis, hypertension EXAM: NUCLEAR MEDICINE WHOLE BODY BONE SCAN TECHNIQUE: Whole body anterior and posterior images were obtained approximately 3 hours after intravenous injection of radiopharmaceutical. RADIOPHARMACEUTICALS:  23.454 mCi Technetium-55m MDP IV COMPARISON:  None Imaging correlation: PET-CT 07/15/2018 FINDINGS: Widespread osseous metastases identified. Numerous sites of abnormal tracer accumulation are seen throughout the calvaria, ribs, spine, pelvis, humeri, and femora consistent with osseous metastases. Paucity of urinary tract and soft tissue tracer suggesting super scan appearance. IMPRESSION: Widespread osseous metastatic disease. Sites of abnormal tracer accumulation/metastases are seen within the humeri and femora bilaterally; recommend radiographic correlation to exclude sites at risk for pathologic fracture. Electronically Signed   By: Lavonia Dana M.D.   On: 09/22/2018 12:52   Ct Abdomen Pelvis W Contrast  Result Date: 09/22/2018 CLINICAL DATA:  Metastatic lung cancer EXAM: CT CHEST, ABDOMEN, AND PELVIS WITH CONTRAST TECHNIQUE: Multidetector CT imaging of the chest, abdomen and pelvis was performed following the standard protocol during bolus administration of intravenous contrast. CONTRAST:  145mL OMNIPAQUE IOHEXOL 300 MG/ML SOLN, additional oral enteric contrast COMPARISON:  PET-CT, 07/15/2018, MR lumbar spine, 07/04/2018 FINDINGS: CT CHEST FINDINGS Cardiovascular: Right chest port catheter. Aortic atherosclerosis. Normal heart size. Three-vessel coronary artery calcifications. No pericardial effusion. Mediastinum/Nodes: There has been marked interval reduction in size of previously seen bulky paratracheal and AP window mediastinal lymph nodes, largest node now measuring 1.5 x 1.0 cm, previously 3.5 x 2.6 cm. Thyroid gland, trachea, and esophagus demonstrate no  significant findings. Lungs/Pleura: Interval reduction in size of a primary right upper lobe lung mass, now with a residual measuring 6 mm in size and resolution of adjacent small soft tissue nodules (series 4, image 39). The dominant component on this examination previously measured 1.5 x 1.2 cm on prior PET-CT. Mild centrilobular emphysema. No pleural effusion or pneumothorax. Musculoskeletal: No  chest wall mass. CT ABDOMEN PELVIS FINDINGS Hepatobiliary: Coarse contour of the liver. Status post cholecystectomy. Pancreas: Unremarkable. No pancreatic ductal dilatation or surrounding inflammatory changes. Spleen: Splenomegaly, maximum coronal span 14.2 cm. Adrenals/Urinary Tract: Adrenal glands are unremarkable. Kidneys are normal, without renal calculi, solid lesion, or hydronephrosis. Bladder is unremarkable. Stomach/Bowel: Stomach is within normal limits. Appendix is not clearly visualized. No evidence of bowel wall thickening, distention, or inflammatory changes. Vascular/Lymphatic: Aortic atherosclerosis with focal ectasia of the infrarenal abdominal aorta measuring up to 2.4 cm. No enlarged abdominal or pelvic lymph nodes. Reproductive: No mass or other abnormality. Other: No abdominal wall hernia or abnormality. No abdominopelvic ascites. Musculoskeletal: Redemonstrated extensive sclerotic osseous metastatic disease including involvement of the majority of the vertebral bodies, notable for compression and endplate deformities at T2, L1, L2, and S1, these lesions not significantly changed compared to prior examination. There is new sclerosis of other vertebral bodies, likely reflecting post treatment change. There is extensive osseous metastatic disease elsewhere, including involvement of the bilateral acetabula and proximal femurs as seen on prior PET-CT. There is a new sclerotic lesion of the intratrochanteric left femoral neck (series 2, image 111). IMPRESSION: 1. Interval reduction in size of a primary  right upper lobe lung mass, now with a residual measuring 6 mm in size and resolution of adjacent small soft tissue nodules (series 4, image 39). The dominant component on this examination previously measured 1.5 x 1.2 cm on prior PET-CT. There has been marked interval reduction in size of previously seen bulky paratracheal and AP window mediastinal lymph nodes, largest node now measuring 1.5 x 1.0 cm, previously 3.5 x 2.6 cm. Findings are consistent with treatment response. 2. Redemonstrated extensive sclerotic osseous metastatic disease including involvement of the majority of the vertebral bodies, notable for compression and endplate deformities at T2, L1, L2, and S1, these lesions not significantly changed compared to prior examination. There is new sclerosis of other vertebral bodies, likely reflecting post treatment change. There is extensive osseous metastatic disease elsewhere, including involvement of the bilateral acetabula and proximal femurs as seen on prior PET-CT. There is a new sclerotic lesion of the intratrochanteric left femoral neck (series 2, image 111), which may reflect post treatment change of an existing lesion although this location is notable for risk of pathologic fracture. Attention on follow-up. 3. Other chronic, incidental, and postoperative non oncologic findings as detailed above. Electronically Signed   By: Eddie Candle M.D.   On: 09/22/2018 08:44     Assessment and plan- Patient is a 68 y.o. female with extensive stage small cell lung cancer and bone metastases status post 3 cycles of carboplatin etoposide and Tecentriq here for a post ER visit for hypokalemia and hypocalcemia  1.  Hypokalemia: Her potassium is down to 2.8 again today and we will give her 40 mEq of IV potassium with IV fluids.  2.  Hypocalcemia: She did receive Xgeva on 10/01/2018 and this may be a delayed hypocalcemia secondary to that.  We will give her IV calcium replacement today as well.  She will  return to clinic in 5 days time on 10/21/2018.  She is scheduled for her fourth cycle of chemotherapy on that day and may require additional potassium and calcium replacements.   Visit Diagnosis 1. Hypocalcemia   2. Hypomagnesemia   3. Small cell lung cancer, right upper lobe ([place])      Dr. Randa Evens, MD, MPH [facility] [phone] 10/19/2018 6:25 PM

## 2018-10-20 ENCOUNTER — Other Ambulatory Visit: Payer: Self-pay | Admitting: *Deleted

## 2018-10-21 ENCOUNTER — Other Ambulatory Visit: Payer: Self-pay

## 2018-10-21 ENCOUNTER — Inpatient Hospital Stay: Payer: PPO

## 2018-10-21 ENCOUNTER — Encounter: Payer: Self-pay | Admitting: Oncology

## 2018-10-21 ENCOUNTER — Inpatient Hospital Stay (HOSPITAL_BASED_OUTPATIENT_CLINIC_OR_DEPARTMENT_OTHER): Payer: PPO | Admitting: Oncology

## 2018-10-21 ENCOUNTER — Inpatient Hospital Stay (HOSPITAL_BASED_OUTPATIENT_CLINIC_OR_DEPARTMENT_OTHER): Payer: PPO | Admitting: Hospice and Palliative Medicine

## 2018-10-21 VITALS — BP 126/72 | HR 79 | Temp 97.8°F | Resp 16 | Ht 64.0 in | Wt 163.4 lb

## 2018-10-21 DIAGNOSIS — K219 Gastro-esophageal reflux disease without esophagitis: Secondary | ICD-10-CM | POA: Diagnosis not present

## 2018-10-21 DIAGNOSIS — Z515 Encounter for palliative care: Secondary | ICD-10-CM

## 2018-10-21 DIAGNOSIS — C3411 Malignant neoplasm of upper lobe, right bronchus or lung: Secondary | ICD-10-CM

## 2018-10-21 DIAGNOSIS — C7951 Secondary malignant neoplasm of bone: Secondary | ICD-10-CM | POA: Diagnosis not present

## 2018-10-21 DIAGNOSIS — D6481 Anemia due to antineoplastic chemotherapy: Secondary | ICD-10-CM | POA: Diagnosis not present

## 2018-10-21 DIAGNOSIS — Z95828 Presence of other vascular implants and grafts: Secondary | ICD-10-CM

## 2018-10-21 DIAGNOSIS — E875 Hyperkalemia: Secondary | ICD-10-CM | POA: Diagnosis not present

## 2018-10-21 DIAGNOSIS — Z87891 Personal history of nicotine dependence: Secondary | ICD-10-CM | POA: Diagnosis not present

## 2018-10-21 DIAGNOSIS — R5383 Other fatigue: Secondary | ICD-10-CM

## 2018-10-21 DIAGNOSIS — T451X5A Adverse effect of antineoplastic and immunosuppressive drugs, initial encounter: Secondary | ICD-10-CM

## 2018-10-21 DIAGNOSIS — Z5112 Encounter for antineoplastic immunotherapy: Secondary | ICD-10-CM

## 2018-10-21 DIAGNOSIS — Z79899 Other long term (current) drug therapy: Secondary | ICD-10-CM

## 2018-10-21 DIAGNOSIS — Z8249 Family history of ischemic heart disease and other diseases of the circulatory system: Secondary | ICD-10-CM

## 2018-10-21 DIAGNOSIS — R112 Nausea with vomiting, unspecified: Secondary | ICD-10-CM | POA: Diagnosis not present

## 2018-10-21 DIAGNOSIS — Z5111 Encounter for antineoplastic chemotherapy: Secondary | ICD-10-CM

## 2018-10-21 LAB — COMPREHENSIVE METABOLIC PANEL
ALT: 13 U/L (ref 0–44)
AST: 16 U/L (ref 15–41)
Albumin: 3.8 g/dL (ref 3.5–5.0)
Alkaline Phosphatase: 132 U/L — ABNORMAL HIGH (ref 38–126)
Anion gap: 8 (ref 5–15)
BUN: 10 mg/dL (ref 8–23)
CO2: 26 mmol/L (ref 22–32)
Calcium: 6.9 mg/dL — ABNORMAL LOW (ref 8.9–10.3)
Chloride: 106 mmol/L (ref 98–111)
Creatinine, Ser: 0.55 mg/dL (ref 0.44–1.00)
GFR calc Af Amer: >60 mL/min (ref >60)
GFR calc non Af Amer: >60 mL/min (ref >60)
Glucose, Bld: 100 mg/dL — ABNORMAL HIGH (ref 70–99)
Potassium: 3.6 mmol/L (ref 3.5–5.1)
Sodium: 140 mmol/L (ref 135–145)
Total Bilirubin: 0.6 mg/dL (ref 0.3–1.2)
Total Protein: 6.8 g/dL (ref 6.5–8.1)

## 2018-10-21 LAB — CBC WITH DIFFERENTIAL/PLATELET
Abs Immature Granulocytes: 0.27 10*3/uL — ABNORMAL HIGH (ref 0.00–0.07)
Basophils Absolute: 0 10*3/uL (ref 0.0–0.1)
Basophils Relative: 0 %
Eosinophils Absolute: 0 10*3/uL (ref 0.0–0.5)
Eosinophils Relative: 0 %
HCT: 26.6 % — ABNORMAL LOW (ref 36.0–46.0)
Hemoglobin: 8.2 g/dL — ABNORMAL LOW (ref 12.0–15.0)
Immature Granulocytes: 4 %
Lymphocytes Relative: 12 %
Lymphs Abs: 0.7 10*3/uL (ref 0.7–4.0)
MCH: 28 pg (ref 26.0–34.0)
MCHC: 30.8 g/dL (ref 30.0–36.0)
MCV: 90.8 fL (ref 80.0–100.0)
Monocytes Absolute: 0.4 10*3/uL (ref 0.1–1.0)
Monocytes Relative: 7 %
Neutro Abs: 4.6 10*3/uL (ref 1.7–7.7)
Neutrophils Relative %: 77 %
Platelets: 143 10*3/uL — ABNORMAL LOW (ref 150–400)
RBC: 2.93 MIL/uL — ABNORMAL LOW (ref 3.87–5.11)
RDW: 24.2 % — ABNORMAL HIGH (ref 11.5–15.5)
WBC: 6.1 10*3/uL (ref 4.0–10.5)
nRBC: 0.7 % — ABNORMAL HIGH (ref 0.0–0.2)

## 2018-10-21 MED ORDER — PALONOSETRON HCL INJECTION 0.25 MG/5ML
0.2500 mg | Freq: Once | INTRAVENOUS | Status: AC
  Administered 2018-10-21: 11:00:00 0.25 mg via INTRAVENOUS
  Filled 2018-10-21: qty 5

## 2018-10-21 MED ORDER — SODIUM CHLORIDE 0.9 % IV SOLN
462.5000 mg | Freq: Once | INTRAVENOUS | Status: AC
  Administered 2018-10-21: 460 mg via INTRAVENOUS
  Filled 2018-10-21: qty 46

## 2018-10-21 MED ORDER — SODIUM CHLORIDE 0.9 % IV SOLN
Freq: Once | INTRAVENOUS | Status: AC
  Administered 2018-10-21: 09:00:00 via INTRAVENOUS
  Filled 2018-10-21: qty 250

## 2018-10-21 MED ORDER — SODIUM CHLORIDE 0.9% FLUSH
10.0000 mL | Freq: Once | INTRAVENOUS | Status: AC
  Administered 2018-10-21: 08:00:00 10 mL via INTRAVENOUS
  Filled 2018-10-21: qty 10

## 2018-10-21 MED ORDER — HEPARIN SOD (PORK) LOCK FLUSH 100 UNIT/ML IV SOLN
500.0000 [IU] | Freq: Once | INTRAVENOUS | Status: AC | PRN
  Administered 2018-10-21: 15:00:00 500 [IU]
  Filled 2018-10-21: qty 5

## 2018-10-21 MED ORDER — SODIUM CHLORIDE 0.9 % IV SOLN
Freq: Once | INTRAVENOUS | Status: AC
  Administered 2018-10-21: 10:00:00 via INTRAVENOUS
  Filled 2018-10-21: qty 1000

## 2018-10-21 MED ORDER — SODIUM CHLORIDE 0.9 % IV SOLN
1200.0000 mg | Freq: Once | INTRAVENOUS | Status: AC
  Administered 2018-10-21: 1200 mg via INTRAVENOUS
  Filled 2018-10-21: qty 20

## 2018-10-21 MED ORDER — SODIUM CHLORIDE 0.9 % IV SOLN
100.0000 mg/m2 | Freq: Once | INTRAVENOUS | Status: AC
  Administered 2018-10-21: 14:00:00 190 mg via INTRAVENOUS
  Filled 2018-10-21: qty 9.5

## 2018-10-21 MED ORDER — SODIUM CHLORIDE 0.9 % IV SOLN
Freq: Once | INTRAVENOUS | Status: AC
  Administered 2018-10-21: 12:00:00 via INTRAVENOUS
  Filled 2018-10-21: qty 5

## 2018-10-21 NOTE — Progress Notes (Signed)
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 She has been referred to [place] for findings of bone metastases and her recent MRI. She has a prior history of 3 packs/day day smoking for over 45 years and quit smoking 5 years ago.She had a CT chest abdomen pelvis in 2018 which showed a 5 mm lung nodule in the left lower lobe. Recently over the last 2 months patient has been having worsening back pain and was referred to orthopedics. 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 Pathologic fracture of S1.  PET scan showed 2 RUL lung nodules, hilar and mediastinal adenopathy and widespread bone mets. MRI brain negative.  Patientcompletedpalliative RT to her spine. Bronchoscopy showed small cell lung cancer. Palliative carboplatin, etoposide and Tecentriq started on 08/18/2018   Interval history-appetite is improving she states.  Denies any pain.  Felt better after she received IV fluids last week  ECOG PS- 1 Pain scale- 0   Review of systems- Review of Systems  Constitutional: Positive for malaise/fatigue.  Negative for chills, fever and weight loss.  HENT: Negative for congestion, ear discharge and nosebleeds.   Eyes: Negative for blurred vision.  Respiratory: Negative for cough, hemoptysis, sputum production, shortness of breath and wheezing.   Cardiovascular: Negative for chest pain, palpitations, orthopnea and claudication.  Gastrointestinal: Negative for abdominal pain, blood in stool, constipation, diarrhea, heartburn, melena, nausea and vomiting.  Genitourinary: Negative for dysuria, flank pain, frequency, hematuria and urgency.  Musculoskeletal: Negative for back pain, joint pain and myalgias.  Skin: Negative for rash.  Neurological: Negative for dizziness, tingling, focal weakness, seizures, weakness and headaches.  Endo/Heme/Allergies: Does not bruise/bleed easily.  Psychiatric/Behavioral: Negative for depression and suicidal ideas. The patient does not have insomnia.       Allergies  Allergen Reactions   Bee Venom Swelling     Past Medical History:  Diagnosis Date   Arthritis    Cirrhosis ([place])    Depression    GERD (gastroesophageal reflux disease)    Hyperlipidemia    Hypertension    Lung cancer ([place])    Metastatic bone cancer ([place])      Past Surgical History:  Procedure Laterality Date   APPENDECTOMY  1971   CHOLECYSTECTOMY  1971   COLONOSCOPY WITH PROPOFOL N/A 11/15/2016   Procedure: COLONOSCOPY WITH PROPOFOL;  Surgeon: Jonathon Bellows, MD;  Location: [facility] ENDOSCOPY;  Service: Gastroenterology;  Laterality: N/A;   ENDOBRONCHIAL ULTRASOUND Right 07/30/2018   Procedure: ENDOBRONCHIAL ULTRASOUND;  Surgeon: Laverle Hobby, MD;  Location: [facility] ORS;  Service: Pulmonary;  Laterality: Right;   ESOPHAGOGASTRODUODENOSCOPY (EGD) WITH PROPOFOL N/A 01/07/2017   Procedure: ESOPHAGOGASTRODUODENOSCOPY (EGD) WITH PROPOFOL;  Surgeon: Jonathon Bellows, MD;  Location: [facility] ENDOSCOPY;  Service:  Gastroenterology;  Laterality: N/A;   LAPAROSCOPY N/A 03/01/2017   Procedure:  LAPAROSCOPY DIAGNOSTIC;  Surgeon: Robert Bellow, MD;  Location: [facility] ORS;  Service: General;  Laterality: N/A;   PORTA CATH INSERTION N/A 08/14/2018   Procedure: PORTA CATH INSERTION;  Surgeon: Algernon Huxley, MD;  Location: [place] LAB;  Service: Cardiovascular;  Laterality: N/A;   TONSILECTOMY, ADENOIDECTOMY, BILATERAL MYRINGOTOMY AND TUBES  1955   TONSILLECTOMY     VENTRAL HERNIA REPAIR N/A 03/01/2017   10 x 14 CM Ventralight ST mesh, intraperitoneal location.    VENTRAL HERNIA REPAIR N/A 03/01/2017   Procedure: HERNIA REPAIR VENTRAL ADULT;  Surgeon: Robert Bellow, MD;  Location: [facility] ORS;  Service: General;  Laterality: N/A;    Social History   Socioeconomic History   Marital status: Married    Spouse name: Johnny   Number of children: Not on file   Years of education: Not on file   Highest education level: Not on file  Occupational History   Not on file  Social Needs   Financial resource strain: Not hard at all   Food insecurity    Worry: Never true    Inability: Never true   Transportation needs    Medical: No    Non-medical: No  Tobacco Use   Smoking status: Former Smoker    Packs/day: 2.00    Years: 50.00    Pack years: 100.00    Types: Cigarettes    Quit date: 02/07/2012    Years since quitting: 6.7   Smokeless tobacco: Never Used   Tobacco comment: uses vape without nicotine, one capsule every 3 months.  Substance and Sexual Activity   Alcohol use: No    Alcohol/week: 0.0 standard drinks   Drug use: No   Sexual activity: Yes  Lifestyle   Physical activity    Days per week: Not on file    Minutes per session: Not on file   Stress: Not at all  Relationships   Social connections    Talks on phone: Not on file    Gets together: Not on file    Attends religious service: Not on file    Active member of club or organization: Not on file    Attends meetings of clubs or organizations: Not on file    Relationship status: Not on  file   Intimate partner violence    Fear of current or ex partner: No    Emotionally abused: No    Physically abused: No    Forced sexual activity: No  Other Topics Concern   Not on file  Social History Narrative   Married   Retired   Clinical cytogeneticist level of education    No children    1 cup of coffee    Family History  Problem Relation Age of Onset   Hypertension Mother    Ovarian cancer Mother 71   Heart disease Father    Stroke Father    Ovarian cancer Sister        ? dx cancer had hyst.   Breast cancer Neg Hx      Current Outpatient Medications:    buPROPion (WELLBUTRIN XL) 300 MG 24 hr tablet, Take 1 tablet by mouth once daily, Disp: 90 tablet, Rfl: 0   escitalopram (LEXAPRO) 10 MG tablet, Take 1 tablet (10 mg total) by mouth daily., Disp: 90 tablet, Rfl: 1   folic acid (FOLVITE) 1 MG tablet, Take 1 tablet (1 mg total) by mouth  daily., Disp: 30 tablet, Rfl: 3   ibuprofen (ADVIL,MOTRIN) 200 MG tablet, Take 400 mg by mouth every 8 (eight) hours as needed for headache or moderate pain. , Disp: , Rfl:    lidocaine-prilocaine (EMLA) cream, Apply to affected area once, Disp: 30 g, Rfl: 3   loperamide (IMODIUM) 2 MG capsule, Take 1 capsule (2 mg total) by mouth every 2 (two) hours as needed for diarrhea or loose stools. Do not exceed 16mg  daily., Disp: 30 capsule, Rfl: 1   LORazepam (ATIVAN) 0.5 MG tablet, Take 1 tablet (0.5 mg total) by mouth every 6 (six) hours as needed (Nausea or vomiting)., Disp: 30 tablet, Rfl: 0   Multiple Vitamins-Minerals (PRESERVISION AREDS 2) CAPS, Take 1 capsule by mouth 2 (two) times a day. , Disp: , Rfl:    ondansetron (ZOFRAN) 8 MG tablet, Take 1 tablet (8 mg total) by mouth 2 (two) times daily as needed for refractory nausea / vomiting. Start on day 3 after carboplatin chemo., Disp: 30 tablet, Rfl: 1   oxyCODONE (OXY IR/ROXICODONE) 5 MG immediate release tablet, Take 1 tablet (5 mg total) by mouth every 6 (six) hours as  needed for severe pain., Disp: 120 tablet, Rfl: 0   pantoprazole (PROTONIX) 40 MG tablet, TAKE 1 TABLET BY MOUTH TWICE DAILY BEFORE A MEAL, Disp: 180 tablet, Rfl: 0   potassium chloride SA (K-DUR) 20 MEQ tablet, Take 1 tablet (20 mEq total) by mouth 2 (two) times daily., Disp: 20 tablet, Rfl: 0   prochlorperazine (COMPAZINE) 10 MG tablet, Take 1 tablet (10 mg total) by mouth every 6 (six) hours as needed (Nausea or vomiting)., Disp: 30 tablet, Rfl: 1   rosuvastatin (CRESTOR) 20 MG tablet, TAKE 1 TABLET BY MOUTH ONCE DAILY, Disp: 90 tablet, Rfl: 3 No current facility-administered medications for this visit.   Facility-Administered Medications Ordered in Other Visits:    sodium chloride 0.9 % 1,000 mL with potassium chloride 20 mEq, magnesium sulfate 2 g, calcium gluconate 2 g infusion, , Intravenous, Continuous, Verlon Au, NP, Stopped at 10/17/18 1201  Physical exam:  Vitals:   10/21/18 0841  BP: 126/72  Pulse: 79  Resp: 16  Temp: 97.8 F (36.6 C)  TempSrc: Tympanic  Weight: 163 lb 6.4 oz (74.1 kg)  Height: 5\' 4"  (1.626 m)   Physical Exam Constitutional:      General: She is not in acute distress. HENT:     Head: Normocephalic and atraumatic.  Eyes:     Pupils: Pupils are equal, round, and reactive to light.  Neck:     Musculoskeletal: Normal range of motion.  Cardiovascular:     Rate and Rhythm: Normal rate and regular rhythm.     Heart sounds: Normal heart sounds.  Pulmonary:     Effort: Pulmonary effort is normal.     Breath sounds: Normal breath sounds.  Abdominal:     General: Bowel sounds are normal.     Palpations: Abdomen is soft.  Skin:    General: Skin is warm and dry.  Neurological:     Mental Status: She is alert and oriented to person, place, and time.      CMP Latest Ref Rng & Units 10/21/2018  Glucose 70 - 99 mg/dL 100(H)  BUN 8 - 23 mg/dL 10  Creatinine 0.44 - 1.00 mg/dL 0.55  Sodium 135 - 145 mmol/L 140  Potassium 3.5 - 5.1 mmol/L 3.6    Chloride 98 - 111 mmol/L 106  CO2 22 - 32 mmol/L 26  Calcium 8.9 - 10.3 mg/dL 6.9(L)  Total Protein 6.5 - 8.1 g/dL 6.8  Total Bilirubin 0.3 - 1.2 mg/dL 0.6  Alkaline Phos 38 - 126 U/L 132(H)  AST 15 - 41 U/L 16  ALT 0 - 44 U/L 13   CBC Latest Ref Rng & Units 10/21/2018  WBC 4.0 - 10.5 K/uL 6.1  Hemoglobin 12.0 - 15.0 g/dL 8.2(L)  Hematocrit 36.0 - 46.0 % 26.6(L)  Platelets 150 - 400 K/uL 143(L)    No images are attached to the encounter.  Ct Head Wo Contrast  Result Date: 10/15/2018 CLINICAL DATA:  Altered level of consciousness EXAM: CT HEAD WITHOUT CONTRAST TECHNIQUE: Contiguous axial images were obtained from the base of the skull through the vertex without intravenous contrast. COMPARISON:  None. FINDINGS: Brain: No evidence of acute infarction, hemorrhage, extra-axial collection, ventriculomegaly, or mass effect. Generalized cerebral atrophy. Periventricular white matter low attenuation likely secondary to microangiopathy. Vascular: Cerebrovascular atherosclerotic calcifications are noted. Skull: Negative for fracture or focal lesion. Sinuses/Orbits: Visualized portions of the orbits are unremarkable. Visualized portions of the paranasal sinuses and mastoid air cells are unremarkable. Other: None. IMPRESSION: No acute intracranial pathology. Electronically Signed   By: Kathreen Devoid   On: 10/15/2018 11:28   Ct Chest W Contrast  Result Date: 09/22/2018 CLINICAL DATA:  Metastatic lung cancer EXAM: CT CHEST, ABDOMEN, AND PELVIS WITH CONTRAST TECHNIQUE: Multidetector CT imaging of the chest, abdomen and pelvis was performed following the standard protocol during bolus administration of intravenous contrast. CONTRAST:  133mL OMNIPAQUE IOHEXOL 300 MG/ML SOLN, additional oral enteric contrast COMPARISON:  PET-CT, 07/15/2018, MR lumbar spine, 07/04/2018 FINDINGS: CT CHEST FINDINGS Cardiovascular: Right chest port catheter. Aortic atherosclerosis. Normal heart size. Three-vessel coronary artery  calcifications. No pericardial effusion. Mediastinum/Nodes: There has been marked interval reduction in size of previously seen bulky paratracheal and AP window mediastinal lymph nodes, largest node now measuring 1.5 x 1.0 cm, previously 3.5 x 2.6 cm. Thyroid gland, trachea, and esophagus demonstrate no significant findings. Lungs/Pleura: Interval reduction in size of a primary right upper lobe lung mass, now with a residual measuring 6 mm in size and resolution of adjacent small soft tissue nodules (series 4, image 39). The dominant component on this examination previously measured 1.5 x 1.2 cm on prior PET-CT. Mild centrilobular emphysema. No pleural effusion or pneumothorax. Musculoskeletal: No chest wall mass. CT ABDOMEN PELVIS FINDINGS Hepatobiliary: Coarse contour of the liver. Status post cholecystectomy. Pancreas: Unremarkable. No pancreatic ductal dilatation or surrounding inflammatory changes. Spleen: Splenomegaly, maximum coronal span 14.2 cm. Adrenals/Urinary Tract: Adrenal glands are unremarkable. Kidneys are normal, without renal calculi, solid lesion, or hydronephrosis. Bladder is unremarkable. Stomach/Bowel: Stomach is within normal limits. Appendix is not clearly visualized. No evidence of bowel wall thickening, distention, or inflammatory changes. Vascular/Lymphatic: Aortic atherosclerosis with focal ectasia of the infrarenal abdominal aorta measuring up to 2.4 cm. No enlarged abdominal or pelvic lymph nodes. Reproductive: No mass or other abnormality. Other: No abdominal wall hernia or abnormality. No abdominopelvic ascites. Musculoskeletal: Redemonstrated extensive sclerotic osseous metastatic disease including involvement of the majority of the vertebral bodies, notable for compression and endplate deformities at T2, L1, L2, and S1, these lesions not significantly changed compared to prior examination. There is new sclerosis of other vertebral bodies, likely reflecting post treatment change.  There is extensive osseous metastatic disease elsewhere, including involvement of the bilateral acetabula and proximal femurs as seen on prior PET-CT. There is a new sclerotic lesion of the intratrochanteric left femoral neck (series 2, image  111). IMPRESSION: 1. Interval reduction in size of a primary right upper lobe lung mass, now with a residual measuring 6 mm in size and resolution of adjacent small soft tissue nodules (series 4, image 39). The dominant component on this examination previously measured 1.5 x 1.2 cm on prior PET-CT. There has been marked interval reduction in size of previously seen bulky paratracheal and AP window mediastinal lymph nodes, largest node now measuring 1.5 x 1.0 cm, previously 3.5 x 2.6 cm. Findings are consistent with treatment response. 2. Redemonstrated extensive sclerotic osseous metastatic disease including involvement of the majority of the vertebral bodies, notable for compression and endplate deformities at T2, L1, L2, and S1, these lesions not significantly changed compared to prior examination. There is new sclerosis of other vertebral bodies, likely reflecting post treatment change. There is extensive osseous metastatic disease elsewhere, including involvement of the bilateral acetabula and proximal femurs as seen on prior PET-CT. There is a new sclerotic lesion of the intratrochanteric left femoral neck (series 2, image 111), which may reflect post treatment change of an existing lesion although this location is notable for risk of pathologic fracture. Attention on follow-up. 3. Other chronic, incidental, and postoperative non oncologic findings as detailed above. Electronically Signed   By: Eddie Candle M.D.   On: 09/22/2018 08:44   Nm Bone Scan Whole Body  Result Date: 09/22/2018 CLINICAL DATA:  Advanced stage small cell lung cancer of RIGHT upper lobe with bone metastasis, fell 2 weeks ago, BILATERAL knee wounds, LEFT elbow wound and bruising, history cirrhosis,  hypertension EXAM: NUCLEAR MEDICINE WHOLE BODY BONE SCAN TECHNIQUE: Whole body anterior and posterior images were obtained approximately 3 hours after intravenous injection of radiopharmaceutical. RADIOPHARMACEUTICALS:  23.454 mCi Technetium-28m MDP IV COMPARISON:  None Imaging correlation: PET-CT 07/15/2018 FINDINGS: Widespread osseous metastases identified. Numerous sites of abnormal tracer accumulation are seen throughout the calvaria, ribs, spine, pelvis, humeri, and femora consistent with osseous metastases. Paucity of urinary tract and soft tissue tracer suggesting super scan appearance. IMPRESSION: Widespread osseous metastatic disease. Sites of abnormal tracer accumulation/metastases are seen within the humeri and femora bilaterally; recommend radiographic correlation to exclude sites at risk for pathologic fracture. Electronically Signed   By: Lavonia Dana M.D.   On: 09/22/2018 12:52   Ct Abdomen Pelvis W Contrast  Result Date: 09/22/2018 CLINICAL DATA:  Metastatic lung cancer EXAM: CT CHEST, ABDOMEN, AND PELVIS WITH CONTRAST TECHNIQUE: Multidetector CT imaging of the chest, abdomen and pelvis was performed following the standard protocol during bolus administration of intravenous contrast. CONTRAST:  165mL OMNIPAQUE IOHEXOL 300 MG/ML SOLN, additional oral enteric contrast COMPARISON:  PET-CT, 07/15/2018, MR lumbar spine, 07/04/2018 FINDINGS: CT CHEST FINDINGS Cardiovascular: Right chest port catheter. Aortic atherosclerosis. Normal heart size. Three-vessel coronary artery calcifications. No pericardial effusion. Mediastinum/Nodes: There has been marked interval reduction in size of previously seen bulky paratracheal and AP window mediastinal lymph nodes, largest node now measuring 1.5 x 1.0 cm, previously 3.5 x 2.6 cm. Thyroid gland, trachea, and esophagus demonstrate no significant findings. Lungs/Pleura: Interval reduction in size of a primary right upper lobe lung mass, now with a residual measuring  6 mm in size and resolution of adjacent small soft tissue nodules (series 4, image 39). The dominant component on this examination previously measured 1.5 x 1.2 cm on prior PET-CT. Mild centrilobular emphysema. No pleural effusion or pneumothorax. Musculoskeletal: No chest wall mass. CT ABDOMEN PELVIS FINDINGS Hepatobiliary: Coarse contour of the liver. Status post cholecystectomy. Pancreas: Unremarkable. No pancreatic ductal dilatation or  surrounding inflammatory changes. Spleen: Splenomegaly, maximum coronal span 14.2 cm. Adrenals/Urinary Tract: Adrenal glands are unremarkable. Kidneys are normal, without renal calculi, solid lesion, or hydronephrosis. Bladder is unremarkable. Stomach/Bowel: Stomach is within normal limits. Appendix is not clearly visualized. No evidence of bowel wall thickening, distention, or inflammatory changes. Vascular/Lymphatic: Aortic atherosclerosis with focal ectasia of the infrarenal abdominal aorta measuring up to 2.4 cm. No enlarged abdominal or pelvic lymph nodes. Reproductive: No mass or other abnormality. Other: No abdominal wall hernia or abnormality. No abdominopelvic ascites. Musculoskeletal: Redemonstrated extensive sclerotic osseous metastatic disease including involvement of the majority of the vertebral bodies, notable for compression and endplate deformities at T2, L1, L2, and S1, these lesions not significantly changed compared to prior examination. There is new sclerosis of other vertebral bodies, likely reflecting post treatment change. There is extensive osseous metastatic disease elsewhere, including involvement of the bilateral acetabula and proximal femurs as seen on prior PET-CT. There is a new sclerotic lesion of the intratrochanteric left femoral neck (series 2, image 111). IMPRESSION: 1. Interval reduction in size of a primary right upper lobe lung mass, now with a residual measuring 6 mm in size and resolution of adjacent small soft tissue nodules (series 4,  image 39). The dominant component on this examination previously measured 1.5 x 1.2 cm on prior PET-CT. There has been marked interval reduction in size of previously seen bulky paratracheal and AP window mediastinal lymph nodes, largest node now measuring 1.5 x 1.0 cm, previously 3.5 x 2.6 cm. Findings are consistent with treatment response. 2. Redemonstrated extensive sclerotic osseous metastatic disease including involvement of the majority of the vertebral bodies, notable for compression and endplate deformities at T2, L1, L2, and S1, these lesions not significantly changed compared to prior examination. There is new sclerosis of other vertebral bodies, likely reflecting post treatment change. There is extensive osseous metastatic disease elsewhere, including involvement of the bilateral acetabula and proximal femurs as seen on prior PET-CT. There is a new sclerotic lesion of the intratrochanteric left femoral neck (series 2, image 111), which may reflect post treatment change of an existing lesion although this location is notable for risk of pathologic fracture. Attention on follow-up. 3. Other chronic, incidental, and postoperative non oncologic findings as detailed above. Electronically Signed   By: Eddie Candle M.D.   On: 09/22/2018 08:44     Assessment and plan- Patient is a 68 y.o. female with extensive stage lung cancer and bone metastases here for on treatment assessment prior to cycle 4 of carboplatin etoposide and Tecentriq chemotherapy  1.  Patient has baseline anemia which is stable and counts are otherwise okay to proceed with cycle 4 of carboplatin etoposide and Tecentriq chemotherapy today.  I will plan to obtain CT chest abdomen pelvis in about 2 weeks.  2.  Hypocalcemia: She remains asymptomatic.  Probably related to side effect of Xgeva which she received on 10/01/2018.  She received IV calcium last week and she will receive 2 g of IV calcium today and we will repeat her calcium in 2  days to see if she needs more  3.  Anemia likely secondary to chemotherapy.  This is her last cycle of chemotherapy and hopefully should improve once we move onto maintenance Tecentriq only.  She remains requiring blood transfusion after cycle 4 of chemotherapy.  Plan as below  4. Calcium gluconate 2 g IV today.  Carboplatin and etoposide and tecentriq chemotherapy today.  Etoposide tomorrow and day after.  On proneulasta on day  3.  Check CMP on 10/23/2018 for possible IV calcium Return to clinic in 1 week: labs: CBC, hold tube for possible transfusion, CMP for possible IV calcium  Return to clinic in 2 weeks:labs: CBC, hold tube for possible transfusion, CMP for possible IV calcium.  See Dr. Janese Banks.  Needs CT chest abdomen pelvis with contrast and bone scan prior    Visit Diagnosis 1. Small cell lung cancer, right upper lobe ([place])   2. Bone metastases ([place])   3. Encounter for antineoplastic chemotherapy   4. Encounter for antineoplastic immunotherapy   5. Anemia due to antineoplastic chemotherapy   6. Hypocalcemia      Dr. Randa Evens, MD, MPH [facility] [phone] 10/21/2018 1:52 PM

## 2018-10-21 NOTE — Progress Notes (Signed)
Per MD order to infuse 2g of Calcium gluconate over 1 hr. RN verified ordered dose and administration with pharmacist and MD treatment team. MD order to continue with chemotherapy treatment plan today also. Pt updated and educated on the importance of making RN aware if she feels any different or is having any complications during  infusion. Pt verbalizes understanding. RN will continue to monitor pt closely during infusion. VSS.  At 1114 Calcium gluconate infusion was completed, pt shows no signs of distress and no signs of complications.   Sebastien Jackson [employer]

## 2018-10-21 NOTE — Progress Notes (Signed)
[facility]  [phone] [phone]   Name: Anne [name] Date: 10/21/2018 MRN: [id number]  DOB: [birth year]  Patient Care Team: Leone Haven, MD as PCP - General (Family Medicine) Caryl Bis Angela Adam, MD as Consulting Physician (Family Medicine) Bary Castilla, Forest Gleason, MD (General Surgery) Telford Nab, RN as Registered Nurse    REASON FOR CONSULTATION: Palliative Care consult requested for this 68 y.o. 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  PMH also notable for hypertension, hyperlipidemia, obesity, cirrhosis and history of tobacco abuse.  MRI of the lumbar spine 07/04/2018 revealed widespread metastatic disease with pathologic fracture of L2 and S1 and a ventral epidural tumor.  Patient is status post palliative XRT to spine.  She is currently on treatment with carboplatin, etoposide, and Tecentriq. She was referred to palliative care to help address goals and manage ongoing symptoms.   SOCIAL HISTORY:     reports that she quit smoking about 6 years ago. Her smoking use included cigarettes. She has a 100.00 pack-year smoking history. She has never used smokeless tobacco. She reports that she does not drink alcohol or use drugs.   Patient is married and lives at home with her husband.  She has two daughters, one of whom lives in [place] and the other in [place], [place].  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  ADVANCE DIRECTIVES:  Does not have  CODE STATUS:   PAST MEDICAL HISTORY: Past Medical History:  Diagnosis Date   Arthritis    Cirrhosis ([place])    Depression    GERD (gastroesophageal reflux disease)    Hyperlipidemia    Hypertension    Lung cancer ([place])    Metastatic bone cancer ([place])     PAST SURGICAL HISTORY:  Past Surgical History:  Procedure Laterality Date    APPENDECTOMY  1971   CHOLECYSTECTOMY  1971   COLONOSCOPY WITH PROPOFOL N/A 11/15/2016   Procedure: COLONOSCOPY WITH PROPOFOL;  Surgeon: Jonathon Bellows, MD;  Location: [facility] ENDOSCOPY;  Service: Gastroenterology;  Laterality: N/A;   ENDOBRONCHIAL ULTRASOUND Right 07/30/2018   Procedure: ENDOBRONCHIAL ULTRASOUND;  Surgeon: Laverle Hobby, MD;  Location: [facility] ORS;  Service: Pulmonary;  Laterality: Right;   ESOPHAGOGASTRODUODENOSCOPY (EGD) WITH PROPOFOL N/A 01/07/2017   Procedure: ESOPHAGOGASTRODUODENOSCOPY (EGD) WITH PROPOFOL;  Surgeon: Jonathon Bellows, MD;  Location: [facility] ENDOSCOPY;  Service: Gastroenterology;  Laterality: N/A;   LAPAROSCOPY N/A 03/01/2017   Procedure: LAPAROSCOPY DIAGNOSTIC;  Surgeon: Robert Bellow, MD;  Location: [facility] ORS;  Service: General;  Laterality: N/A;   PORTA CATH INSERTION N/A 08/14/2018   Procedure: PORTA CATH INSERTION;  Surgeon: Algernon Huxley, MD;  Location: [facility];  Service: Cardiovascular;  Laterality: N/A;   TONSILECTOMY, ADENOIDECTOMY, BILATERAL MYRINGOTOMY AND TUBES  1955   TONSILLECTOMY     VENTRAL HERNIA REPAIR N/A 03/01/2017   10 x 14 CM Ventralight ST mesh, intraperitoneal location.    VENTRAL HERNIA REPAIR N/A 03/01/2017   Procedure: HERNIA REPAIR VENTRAL ADULT;  Surgeon: Robert Bellow, MD;  Location: [facility] ORS;  Service: General;  Laterality: N/A;    HEMATOLOGY/ONCOLOGY HISTORY:  Oncology History  Small cell lung cancer, right upper lobe ([place])  08/05/2018 Cancer Staging   Staging form: Lung, AJCC 8th Edition - Clinical stage from 08/05/2018: Stage IVB (cT3, cN2, cM1c) - Signed by Sindy Guadeloupe, MD on 08/06/2018  08/06/2018 Initial Diagnosis   Small cell lung cancer, right upper lobe ([place])   08/18/2018 -  Chemotherapy   The patient had palonosetron (ALOXI) injection 0.25 mg, 0.25 mg, Intravenous,  Once, 4 of 4 cycles Administration: 0.25 mg (08/18/2018), 0.25 mg (09/09/2018), 0.25 mg (09/30/2018) pegfilgrastim (NEULASTA ONPRO KIT)  injection 6 mg, 6 mg, Subcutaneous, Once, 4 of 4 cycles Administration: 6 mg (08/20/2018), 6 mg (09/11/2018), 6 mg (10/02/2018) CARBOplatin (PARAPLATIN) 460 mg in sodium chloride 0.9 % 250 mL chemo infusion, 460 mg (100 % of original dose 462.5 mg), Intravenous,  Once, 4 of 4 cycles Dose modification:   (original dose 462.5 mg, Cycle 1) Administration: 460 mg (08/18/2018), 460 mg (09/09/2018), 460 mg (09/30/2018) etoposide (VEPESID) 190 mg in sodium chloride 0.9 % 500 mL chemo infusion, 100 mg/m2 = 190 mg, Intravenous,  Once, 4 of 4 cycles Administration: 190 mg (08/18/2018), 190 mg (08/19/2018), 190 mg (08/20/2018), 190 mg (09/09/2018), 190 mg (09/10/2018), 190 mg (09/11/2018), 190 mg (09/30/2018), 190 mg (10/01/2018), 190 mg (10/02/2018) fosaprepitant (EMEND) 150 mg, dexamethasone (DECADRON) 12 mg in sodium chloride 0.9 % 145 mL IVPB, , Intravenous,  Once, 4 of 4 cycles Administration:  (08/18/2018),  (09/09/2018),  (09/30/2018) atezolizumab (TECENTRIQ) 1,200 mg in sodium chloride 0.9 % 250 mL chemo infusion, 1,200 mg, Intravenous, Once, 4 of 8 cycles Administration: 1,200 mg (08/18/2018), 1,200 mg (09/09/2018), 1,200 mg (09/30/2018)  for chemotherapy treatment.      ALLERGIES:  is allergic to bee venom.  MEDICATIONS:  Current Outpatient Medications  Medication Sig Dispense Refill   buPROPion (WELLBUTRIN XL) 300 MG 24 hr tablet Take 1 tablet by mouth once daily 90 tablet 0   escitalopram (LEXAPRO) 10 MG tablet Take 1 tablet (10 mg total) by mouth daily. 90 tablet 1   folic acid (FOLVITE) 1 MG tablet Take 1 tablet (1 mg total) by mouth daily. 30 tablet 3   ibuprofen (ADVIL,MOTRIN) 200 MG tablet Take 400 mg by mouth every 8 (eight) hours as needed for headache or moderate pain.      lidocaine-prilocaine (EMLA) cream Apply to affected area once 30 g 3   loperamide (IMODIUM) 2 MG capsule Take 1 capsule (2 mg total) by mouth every 2 (two) hours as needed for diarrhea or loose stools. Do not exceed 32m daily. 30  capsule 1   LORazepam (ATIVAN) 0.5 MG tablet Take 1 tablet (0.5 mg total) by mouth every 6 (six) hours as needed (Nausea or vomiting). 30 tablet 0   Multiple Vitamins-Minerals (PRESERVISION AREDS 2) CAPS Take 1 capsule by mouth 2 (two) times a day.      ondansetron (ZOFRAN) 8 MG tablet Take 1 tablet (8 mg total) by mouth 2 (two) times daily as needed for refractory nausea / vomiting. Start on day 3 after carboplatin chemo. 30 tablet 1   oxyCODONE (OXY IR/ROXICODONE) 5 MG immediate release tablet Take 1 tablet (5 mg total) by mouth every 6 (six) hours as needed for severe pain. 120 tablet 0   pantoprazole (PROTONIX) 40 MG tablet TAKE 1 TABLET BY MOUTH TWICE DAILY BEFORE A MEAL 180 tablet 0   potassium chloride SA (K-DUR) 20 MEQ tablet Take 1 tablet (20 mEq total) by mouth 2 (two) times daily. 20 tablet 0   prochlorperazine (COMPAZINE) 10 MG tablet Take 1 tablet (10 mg total) by mouth every 6 (six) hours as needed (Nausea or vomiting). 30 tablet 1   rosuvastatin (CRESTOR) 20 MG tablet TAKE 1 TABLET BY MOUTH ONCE DAILY 90 tablet 3  No current facility-administered medications for this visit.    Facility-Administered Medications Ordered in Other Visits  Medication Dose Route Frequency Provider Last Rate Last Dose   atezolizumab (TECENTRIQ) 1,200 mg in sodium chloride 0.9 % 250 mL chemo infusion  1,200 mg Intravenous Once Sindy Guadeloupe, MD       CARBOplatin (PARAPLATIN) 460 mg in sodium chloride 0.9 % 250 mL chemo infusion  460 mg Intravenous Once Sindy Guadeloupe, MD       etoposide (VEPESID) 190 mg in sodium chloride 0.9 % 500 mL chemo infusion  100 mg/m2 (Treatment Plan Recorded) Intravenous Once Sindy Guadeloupe, MD       fosaprepitant (EMEND) 150 mg, dexamethasone (DECADRON) 12 mg in sodium chloride 0.9 % 145 mL IVPB   Intravenous Once Sindy Guadeloupe, MD 454 mL/hr at 10/21/18 1134      VITAL SIGNS: There were no vitals taken for this visit. There were no vitals filed for this visit.    Estimated body mass index is 28.05 kg/m as calculated from the following:   Height as of an earlier encounter on 10/21/18: 5' 4"  (1.626 m).   Weight as of an earlier encounter on 10/21/18: 163 lb 6.4 oz (74.1 kg).  LABS: CBC:    Component Value Date/Time   WBC 6.1 10/21/2018 0810   HGB 8.2 (L) 10/21/2018 0810   HCT 26.6 (L) 10/21/2018 0810   PLT 143 (L) 10/21/2018 0810   MCV 90.8 10/21/2018 0810   NEUTROABS 4.6 10/21/2018 0810   LYMPHSABS 0.7 10/21/2018 0810   MONOABS 0.4 10/21/2018 0810   EOSABS 0.0 10/21/2018 0810   BASOSABS 0.0 10/21/2018 0810   Comprehensive Metabolic Panel:    Component Value Date/Time   NA 140 10/21/2018 0810   K 3.6 10/21/2018 0810   CL 106 10/21/2018 0810   CO2 26 10/21/2018 0810   BUN 10 10/21/2018 0810   CREATININE 0.55 10/21/2018 0810   CREATININE 1.09 02/26/2012 0925   GLUCOSE 100 (H) 10/21/2018 0810   CALCIUM 6.9 (L) 10/21/2018 0810   AST 16 10/21/2018 0810   ALT 13 10/21/2018 0810   ALKPHOS 132 (H) 10/21/2018 0810   BILITOT 0.6 10/21/2018 0810   PROT 6.8 10/21/2018 0810   ALBUMIN 3.8 10/21/2018 0810    RADIOGRAPHIC STUDIES: Ct Head Wo Contrast  Result Date: 10/15/2018 CLINICAL DATA:  Altered level of consciousness EXAM: CT HEAD WITHOUT CONTRAST TECHNIQUE: Contiguous axial images were obtained from the base of the skull through the vertex without intravenous contrast. COMPARISON:  None. FINDINGS: Brain: No evidence of acute infarction, hemorrhage, extra-axial collection, ventriculomegaly, or mass effect. Generalized cerebral atrophy. Periventricular white matter low attenuation likely secondary to microangiopathy. Vascular: Cerebrovascular atherosclerotic calcifications are noted. Skull: Negative for fracture or focal lesion. Sinuses/Orbits: Visualized portions of the orbits are unremarkable. Visualized portions of the paranasal sinuses and mastoid air cells are unremarkable. Other: None. IMPRESSION: No acute intracranial pathology.  Electronically Signed   By: Kathreen Devoid   On: 10/15/2018 11:28   Ct Chest W Contrast  Result Date: 09/22/2018 CLINICAL DATA:  Metastatic lung cancer EXAM: CT CHEST, ABDOMEN, AND PELVIS WITH CONTRAST TECHNIQUE: Multidetector CT imaging of the chest, abdomen and pelvis was performed following the standard protocol during bolus administration of intravenous contrast. CONTRAST:  141m OMNIPAQUE IOHEXOL 300 MG/ML SOLN, additional oral enteric contrast COMPARISON:  PET-CT, 07/15/2018, MR lumbar spine, 07/04/2018 FINDINGS: CT CHEST FINDINGS Cardiovascular: Right chest port catheter. Aortic atherosclerosis. Normal heart size. Three-vessel coronary artery calcifications. No pericardial  effusion. Mediastinum/Nodes: There has been marked interval reduction in size of previously seen bulky paratracheal and AP window mediastinal lymph nodes, largest node now measuring 1.5 x 1.0 cm, previously 3.5 x 2.6 cm. Thyroid gland, trachea, and esophagus demonstrate no significant findings. Lungs/Pleura: Interval reduction in size of a primary right upper lobe lung mass, now with a residual measuring 6 mm in size and resolution of adjacent small soft tissue nodules (series 4, image 39). The dominant component on this examination previously measured 1.5 x 1.2 cm on prior PET-CT. Mild centrilobular emphysema. No pleural effusion or pneumothorax. Musculoskeletal: No chest wall mass. CT ABDOMEN PELVIS FINDINGS Hepatobiliary: Coarse contour of the liver. Status post cholecystectomy. Pancreas: Unremarkable. No pancreatic ductal dilatation or surrounding inflammatory changes. Spleen: Splenomegaly, maximum coronal span 14.2 cm. Adrenals/Urinary Tract: Adrenal glands are unremarkable. Kidneys are normal, without renal calculi, solid lesion, or hydronephrosis. Bladder is unremarkable. Stomach/Bowel: Stomach is within normal limits. Appendix is not clearly visualized. No evidence of bowel wall thickening, distention, or inflammatory changes.  Vascular/Lymphatic: Aortic atherosclerosis with focal ectasia of the infrarenal abdominal aorta measuring up to 2.4 cm. No enlarged abdominal or pelvic lymph nodes. Reproductive: No mass or other abnormality. Other: No abdominal wall hernia or abnormality. No abdominopelvic ascites. Musculoskeletal: Redemonstrated extensive sclerotic osseous metastatic disease including involvement of the majority of the vertebral bodies, notable for compression and endplate deformities at T2, L1, L2, and S1, these lesions not significantly changed compared to prior examination. There is new sclerosis of other vertebral bodies, likely reflecting post treatment change. There is extensive osseous metastatic disease elsewhere, including involvement of the bilateral acetabula and proximal femurs as seen on prior PET-CT. There is a new sclerotic lesion of the intratrochanteric left femoral neck (series 2, image 111). IMPRESSION: 1. Interval reduction in size of a primary right upper lobe lung mass, now with a residual measuring 6 mm in size and resolution of adjacent small soft tissue nodules (series 4, image 39). The dominant component on this examination previously measured 1.5 x 1.2 cm on prior PET-CT. There has been marked interval reduction in size of previously seen bulky paratracheal and AP window mediastinal lymph nodes, largest node now measuring 1.5 x 1.0 cm, previously 3.5 x 2.6 cm. Findings are consistent with treatment response. 2. Redemonstrated extensive sclerotic osseous metastatic disease including involvement of the majority of the vertebral bodies, notable for compression and endplate deformities at T2, L1, L2, and S1, these lesions not significantly changed compared to prior examination. There is new sclerosis of other vertebral bodies, likely reflecting post treatment change. There is extensive osseous metastatic disease elsewhere, including involvement of the bilateral acetabula and proximal femurs as seen on prior  PET-CT. There is a new sclerotic lesion of the intratrochanteric left femoral neck (series 2, image 111), which may reflect post treatment change of an existing lesion although this location is notable for risk of pathologic fracture. Attention on follow-up. 3. Other chronic, incidental, and postoperative non oncologic findings as detailed above. Electronically Signed   By: Eddie Candle M.D.   On: 09/22/2018 08:44   Nm Bone Scan Whole Body  Result Date: 09/22/2018 CLINICAL DATA:  Advanced stage small cell lung cancer of RIGHT upper lobe with bone metastasis, fell 2 weeks ago, BILATERAL knee wounds, LEFT elbow wound and bruising, history cirrhosis, hypertension EXAM: NUCLEAR MEDICINE WHOLE BODY BONE SCAN TECHNIQUE: Whole body anterior and posterior images were obtained approximately 3 hours after intravenous injection of radiopharmaceutical. RADIOPHARMACEUTICALS:  23.454 mCi Technetium-50mMDP IV  COMPARISON:  None Imaging correlation: PET-CT 07/15/2018 FINDINGS: Widespread osseous metastases identified. Numerous sites of abnormal tracer accumulation are seen throughout the calvaria, ribs, spine, pelvis, humeri, and femora consistent with osseous metastases. Paucity of urinary tract and soft tissue tracer suggesting super scan appearance. IMPRESSION: Widespread osseous metastatic disease. Sites of abnormal tracer accumulation/metastases are seen within the humeri and femora bilaterally; recommend radiographic correlation to exclude sites at risk for pathologic fracture. Electronically Signed   By: Lavonia Dana M.D.   On: 09/22/2018 12:52   Ct Abdomen Pelvis W Contrast  Result Date: 09/22/2018 CLINICAL DATA:  Metastatic lung cancer EXAM: CT CHEST, ABDOMEN, AND PELVIS WITH CONTRAST TECHNIQUE: Multidetector CT imaging of the chest, abdomen and pelvis was performed following the standard protocol during bolus administration of intravenous contrast. CONTRAST:  145m OMNIPAQUE IOHEXOL 300 MG/ML SOLN, additional oral  enteric contrast COMPARISON:  PET-CT, 07/15/2018, MR lumbar spine, 07/04/2018 FINDINGS: CT CHEST FINDINGS Cardiovascular: Right chest port catheter. Aortic atherosclerosis. Normal heart size. Three-vessel coronary artery calcifications. No pericardial effusion. Mediastinum/Nodes: There has been marked interval reduction in size of previously seen bulky paratracheal and AP window mediastinal lymph nodes, largest node now measuring 1.5 x 1.0 cm, previously 3.5 x 2.6 cm. Thyroid gland, trachea, and esophagus demonstrate no significant findings. Lungs/Pleura: Interval reduction in size of a primary right upper lobe lung mass, now with a residual measuring 6 mm in size and resolution of adjacent small soft tissue nodules (series 4, image 39). The dominant component on this examination previously measured 1.5 x 1.2 cm on prior PET-CT. Mild centrilobular emphysema. No pleural effusion or pneumothorax. Musculoskeletal: No chest wall mass. CT ABDOMEN PELVIS FINDINGS Hepatobiliary: Coarse contour of the liver. Status post cholecystectomy. Pancreas: Unremarkable. No pancreatic ductal dilatation or surrounding inflammatory changes. Spleen: Splenomegaly, maximum coronal span 14.2 cm. Adrenals/Urinary Tract: Adrenal glands are unremarkable. Kidneys are normal, without renal calculi, solid lesion, or hydronephrosis. Bladder is unremarkable. Stomach/Bowel: Stomach is within normal limits. Appendix is not clearly visualized. No evidence of bowel wall thickening, distention, or inflammatory changes. Vascular/Lymphatic: Aortic atherosclerosis with focal ectasia of the infrarenal abdominal aorta measuring up to 2.4 cm. No enlarged abdominal or pelvic lymph nodes. Reproductive: No mass or other abnormality. Other: No abdominal wall hernia or abnormality. No abdominopelvic ascites. Musculoskeletal: Redemonstrated extensive sclerotic osseous metastatic disease including involvement of the majority of the vertebral bodies, notable for  compression and endplate deformities at T2, L1, L2, and S1, these lesions not significantly changed compared to prior examination. There is new sclerosis of other vertebral bodies, likely reflecting post treatment change. There is extensive osseous metastatic disease elsewhere, including involvement of the bilateral acetabula and proximal femurs as seen on prior PET-CT. There is a new sclerotic lesion of the intratrochanteric left femoral neck (series 2, image 111). IMPRESSION: 1. Interval reduction in size of a primary right upper lobe lung mass, now with a residual measuring 6 mm in size and resolution of adjacent small soft tissue nodules (series 4, image 39). The dominant component on this examination previously measured 1.5 x 1.2 cm on prior PET-CT. There has been marked interval reduction in size of previously seen bulky paratracheal and AP window mediastinal lymph nodes, largest node now measuring 1.5 x 1.0 cm, previously 3.5 x 2.6 cm. Findings are consistent with treatment response. 2. Redemonstrated extensive sclerotic osseous metastatic disease including involvement of the majority of the vertebral bodies, notable for compression and endplate deformities at T2, L1, L2, and S1, these lesions not significantly changed compared  to prior examination. There is new sclerosis of other vertebral bodies, likely reflecting post treatment change. There is extensive osseous metastatic disease elsewhere, including involvement of the bilateral acetabula and proximal femurs as seen on prior PET-CT. There is a new sclerotic lesion of the intratrochanteric left femoral neck (series 2, image 111), which may reflect post treatment change of an existing lesion although this location is notable for risk of pathologic fracture. Attention on follow-up. 3. Other chronic, incidental, and postoperative non oncologic findings as detailed above. Electronically Signed   By: Eddie Candle M.D.   On: 09/22/2018 08:44    PERFORMANCE  STATUS (ECOG) : 1 - Symptomatic but completely ambulatory  Review of Systems Unless otherwise noted, a complete review of systems is negative.  Physical Exam General: NAD, frail appearing Pulmonary: Unlabored Extremities: no edema Skin: no rashes Neurological: Weakness but otherwise nonfocal  IMPRESSION: Routine follow-up visit today.  Patient was seen in the infusion area.  Patient reports that overall she feels she is doing better.  She denies any distressing symptoms today.  She feels like her oral intake has improved.  Her weight is down 4 pounds over the past month to current weight of 163 pounds.  She is being followed by our dietitian.  Patient says that she is emotionally coping better with her illness.  She spoke of her family as being incredibly supportive.  We will need to have a more in-depth conversation with her regarding ACP and I would like to complete a MOST Form when patient can be seen in a private exam room.   PLAN: -Continue current scope of treatment -Complete ACP/MOST form when patient can be seen in private -RTC in 3-4 weeks   Patient expressed understanding and was in agreement with this plan. She also understands that She can call the clinic at any time with any questions, concerns, or complaints.     Time Total: 15 minutes  Visit consisted of counseling and education dealing with the complex and emotionally intense issues of symptom management and palliative care in the setting of serious and potentially life-threatening illness.Greater than 50%  of this time was spent counseling and coordinating care related to the above assessment and plan.  Signed by: Altha Harm, PhD, NP-C [phone] (Work Cell)

## 2018-10-21 NOTE — Progress Notes (Signed)
Pt feels better from the fluids she was given last week

## 2018-10-22 ENCOUNTER — Other Ambulatory Visit: Payer: Self-pay

## 2018-10-22 ENCOUNTER — Other Ambulatory Visit: Payer: Self-pay | Admitting: *Deleted

## 2018-10-22 ENCOUNTER — Inpatient Hospital Stay: Payer: PPO

## 2018-10-22 VITALS — BP 118/71 | HR 83 | Temp 96.0°F | Resp 18

## 2018-10-22 DIAGNOSIS — R112 Nausea with vomiting, unspecified: Secondary | ICD-10-CM | POA: Diagnosis not present

## 2018-10-22 DIAGNOSIS — C3411 Malignant neoplasm of upper lobe, right bronchus or lung: Secondary | ICD-10-CM

## 2018-10-22 MED ORDER — SODIUM CHLORIDE 0.9 % IV SOLN
100.0000 mg/m2 | Freq: Once | INTRAVENOUS | Status: AC
  Administered 2018-10-22: 190 mg via INTRAVENOUS
  Filled 2018-10-22: qty 9.5

## 2018-10-22 MED ORDER — SODIUM CHLORIDE 0.9 % IV SOLN
Freq: Once | INTRAVENOUS | Status: AC
  Administered 2018-10-22: 14:00:00 via INTRAVENOUS
  Filled 2018-10-22: qty 250

## 2018-10-22 MED ORDER — HEPARIN SOD (PORK) LOCK FLUSH 100 UNIT/ML IV SOLN
500.0000 [IU] | Freq: Once | INTRAVENOUS | Status: AC
  Administered 2018-10-22: 500 [IU] via INTRAVENOUS

## 2018-10-22 MED ORDER — DEXAMETHASONE SODIUM PHOSPHATE 10 MG/ML IJ SOLN
10.0000 mg | Freq: Once | INTRAMUSCULAR | Status: AC
  Administered 2018-10-22: 10 mg via INTRAVENOUS
  Filled 2018-10-22: qty 1

## 2018-10-23 ENCOUNTER — Inpatient Hospital Stay: Payer: PPO

## 2018-10-23 ENCOUNTER — Other Ambulatory Visit: Payer: Self-pay

## 2018-10-23 VITALS — BP 120/69 | HR 73 | Temp 97.0°F | Resp 20

## 2018-10-23 DIAGNOSIS — R112 Nausea with vomiting, unspecified: Secondary | ICD-10-CM | POA: Diagnosis not present

## 2018-10-23 DIAGNOSIS — C3411 Malignant neoplasm of upper lobe, right bronchus or lung: Secondary | ICD-10-CM

## 2018-10-23 DIAGNOSIS — Z95828 Presence of other vascular implants and grafts: Secondary | ICD-10-CM

## 2018-10-23 LAB — COMPREHENSIVE METABOLIC PANEL
ALT: 13 U/L (ref 0–44)
AST: 16 U/L (ref 15–41)
Albumin: 3.6 g/dL (ref 3.5–5.0)
Alkaline Phosphatase: 105 U/L (ref 38–126)
Anion gap: 6 (ref 5–15)
BUN: 17 mg/dL (ref 8–23)
CO2: 24 mmol/L (ref 22–32)
Calcium: 7.5 mg/dL — ABNORMAL LOW (ref 8.9–10.3)
Chloride: 111 mmol/L (ref 98–111)
Creatinine, Ser: 0.62 mg/dL (ref 0.44–1.00)
GFR calc Af Amer: >60 mL/min (ref >60)
GFR calc non Af Amer: >60 mL/min (ref >60)
Glucose, Bld: 94 mg/dL (ref 70–99)
Potassium: 4.1 mmol/L (ref 3.5–5.1)
Sodium: 141 mmol/L (ref 135–145)
Total Bilirubin: 0.6 mg/dL (ref 0.3–1.2)
Total Protein: 6.7 g/dL (ref 6.5–8.1)

## 2018-10-23 MED ORDER — PEGFILGRASTIM 6 MG/0.6ML ~~LOC~~ PSKT
6.0000 mg | PREFILLED_SYRINGE | Freq: Once | SUBCUTANEOUS | Status: AC
  Administered 2018-10-23: 6 mg via SUBCUTANEOUS
  Filled 2018-10-23: qty 0.6

## 2018-10-23 MED ORDER — HEPARIN SOD (PORK) LOCK FLUSH 100 UNIT/ML IV SOLN
500.0000 [IU] | Freq: Once | INTRAVENOUS | Status: AC | PRN
  Administered 2018-10-23: 500 [IU]
  Filled 2018-10-23: qty 5

## 2018-10-23 MED ORDER — SODIUM CHLORIDE 0.9% FLUSH
10.0000 mL | Freq: Once | INTRAVENOUS | Status: AC
  Administered 2018-10-23: 13:00:00 10 mL via INTRAVENOUS
  Filled 2018-10-23: qty 10

## 2018-10-23 MED ORDER — DEXAMETHASONE SODIUM PHOSPHATE 10 MG/ML IJ SOLN
10.0000 mg | Freq: Once | INTRAMUSCULAR | Status: AC
  Administered 2018-10-23: 10 mg via INTRAVENOUS
  Filled 2018-10-23: qty 1

## 2018-10-23 MED ORDER — SODIUM CHLORIDE 0.9 % IV SOLN
100.0000 mg/m2 | Freq: Once | INTRAVENOUS | Status: AC
  Administered 2018-10-23: 190 mg via INTRAVENOUS
  Filled 2018-10-23: qty 9.5

## 2018-10-23 MED ORDER — SODIUM CHLORIDE 0.9 % IV SOLN
Freq: Once | INTRAVENOUS | Status: AC
  Administered 2018-10-23: 14:00:00 via INTRAVENOUS
  Filled 2018-10-23: qty 250

## 2018-10-23 MED ORDER — SODIUM CHLORIDE 0.9% FLUSH
10.0000 mL | INTRAVENOUS | Status: DC | PRN
  Administered 2018-10-23: 10 mL
  Filled 2018-10-23: qty 10

## 2018-10-23 NOTE — Progress Notes (Signed)
Nutrition Follow-up:  Patient with stage IV lung cancer with metastatic disease to bone.    Met with patient during infusion today.  Reports that her appetite is better and that she is eating more.  Reports husband recently brought home cookies from work luncheon and she has been enjoying those.  Loves peanut butter sandwiches.  Ate ham sandwich today for lunch before coming to treatment.    Denies any nutrition impact symptoms.   Medications: reviewed  Labs: reviewed  Anthropometrics:   Weight is 163 lb 6.4 oz noted on 9/15.  164 lb on 9/11 170 lb on 8/10 Patient reports that she does not want to gain any more weight.  "My clothes fit better at this weight."  NUTRITION DIAGNOSIS: Inadequate oral intake stable   INTERVENTION:  Discussed importance of good nutrition and weight maintenance.   Encouraged good sources of protein at every meal/snack.  Continue oral nutrition supplements  Contact information provided and patient will reach out to RD with further questions or concerns    MONITORING, EVALUATION, GOAL: Patient will consume adequate calories to maintain muscle mass and weight   NEXT VISIT: as needed  Anne [name], [place], [place] Registered Dietitian [phone] (pager)

## 2018-10-27 ENCOUNTER — Other Ambulatory Visit: Payer: Self-pay

## 2018-10-28 ENCOUNTER — Inpatient Hospital Stay: Payer: PPO

## 2018-10-28 ENCOUNTER — Other Ambulatory Visit: Payer: Self-pay | Admitting: Oncology

## 2018-10-28 ENCOUNTER — Other Ambulatory Visit: Payer: Self-pay

## 2018-10-28 DIAGNOSIS — R000 Tachycardia, unspecified: Secondary | ICD-10-CM | POA: Diagnosis not present

## 2018-10-28 DIAGNOSIS — I340 Nonrheumatic mitral (valve) insufficiency: Secondary | ICD-10-CM | POA: Diagnosis not present

## 2018-10-28 DIAGNOSIS — R5383 Other fatigue: Secondary | ICD-10-CM | POA: Diagnosis not present

## 2018-10-28 DIAGNOSIS — C7800 Secondary malignant neoplasm of unspecified lung: Secondary | ICD-10-CM | POA: Diagnosis present

## 2018-10-28 DIAGNOSIS — N764 Abscess of vulva: Secondary | ICD-10-CM | POA: Diagnosis present

## 2018-10-28 DIAGNOSIS — B955 Unspecified streptococcus as the cause of diseases classified elsewhere: Secondary | ICD-10-CM | POA: Diagnosis not present

## 2018-10-28 DIAGNOSIS — K0889 Other specified disorders of teeth and supporting structures: Secondary | ICD-10-CM | POA: Diagnosis not present

## 2018-10-28 DIAGNOSIS — C3411 Malignant neoplasm of upper lobe, right bronchus or lung: Secondary | ICD-10-CM

## 2018-10-28 DIAGNOSIS — M1990 Unspecified osteoarthritis, unspecified site: Secondary | ICD-10-CM | POA: Diagnosis present

## 2018-10-28 DIAGNOSIS — I10 Essential (primary) hypertension: Secondary | ICD-10-CM | POA: Diagnosis present

## 2018-10-28 DIAGNOSIS — T451X5A Adverse effect of antineoplastic and immunosuppressive drugs, initial encounter: Secondary | ICD-10-CM | POA: Diagnosis present

## 2018-10-28 DIAGNOSIS — K7460 Unspecified cirrhosis of liver: Secondary | ICD-10-CM | POA: Diagnosis present

## 2018-10-28 DIAGNOSIS — F329 Major depressive disorder, single episode, unspecified: Secondary | ICD-10-CM | POA: Diagnosis present

## 2018-10-28 DIAGNOSIS — D6481 Anemia due to antineoplastic chemotherapy: Secondary | ICD-10-CM | POA: Diagnosis not present

## 2018-10-28 DIAGNOSIS — R7881 Bacteremia: Secondary | ICD-10-CM | POA: Diagnosis not present

## 2018-10-28 DIAGNOSIS — C3490 Malignant neoplasm of unspecified part of unspecified bronchus or lung: Secondary | ICD-10-CM | POA: Diagnosis not present

## 2018-10-28 DIAGNOSIS — R109 Unspecified abdominal pain: Secondary | ICD-10-CM | POA: Diagnosis not present

## 2018-10-28 DIAGNOSIS — N159 Renal tubulo-interstitial disease, unspecified: Secondary | ICD-10-CM | POA: Diagnosis present

## 2018-10-28 DIAGNOSIS — D630 Anemia in neoplastic disease: Secondary | ICD-10-CM | POA: Diagnosis present

## 2018-10-28 DIAGNOSIS — L02215 Cutaneous abscess of perineum: Secondary | ICD-10-CM | POA: Diagnosis present

## 2018-10-28 DIAGNOSIS — D61818 Other pancytopenia: Secondary | ICD-10-CM | POA: Diagnosis not present

## 2018-10-28 DIAGNOSIS — Z79899 Other long term (current) drug therapy: Secondary | ICD-10-CM | POA: Diagnosis not present

## 2018-10-28 DIAGNOSIS — R112 Nausea with vomiting, unspecified: Secondary | ICD-10-CM | POA: Diagnosis not present

## 2018-10-28 DIAGNOSIS — D649 Anemia, unspecified: Secondary | ICD-10-CM

## 2018-10-28 DIAGNOSIS — R5081 Fever presenting with conditions classified elsewhere: Secondary | ICD-10-CM | POA: Diagnosis present

## 2018-10-28 DIAGNOSIS — Z87891 Personal history of nicotine dependence: Secondary | ICD-10-CM | POA: Diagnosis not present

## 2018-10-28 DIAGNOSIS — E785 Hyperlipidemia, unspecified: Secondary | ICD-10-CM | POA: Diagnosis not present

## 2018-10-28 DIAGNOSIS — C7951 Secondary malignant neoplasm of bone: Secondary | ICD-10-CM | POA: Diagnosis not present

## 2018-10-28 DIAGNOSIS — R748 Abnormal levels of other serum enzymes: Secondary | ICD-10-CM | POA: Diagnosis not present

## 2018-10-28 DIAGNOSIS — I361 Nonrheumatic tricuspid (valve) insufficiency: Secondary | ICD-10-CM | POA: Diagnosis not present

## 2018-10-28 DIAGNOSIS — A408 Other streptococcal sepsis: Secondary | ICD-10-CM | POA: Diagnosis present

## 2018-10-28 DIAGNOSIS — K219 Gastro-esophageal reflux disease without esophagitis: Secondary | ICD-10-CM | POA: Diagnosis present

## 2018-10-28 DIAGNOSIS — Z20828 Contact with and (suspected) exposure to other viral communicable diseases: Secondary | ICD-10-CM | POA: Diagnosis present

## 2018-10-28 DIAGNOSIS — E876 Hypokalemia: Secondary | ICD-10-CM | POA: Diagnosis not present

## 2018-10-28 DIAGNOSIS — R531 Weakness: Secondary | ICD-10-CM | POA: Diagnosis not present

## 2018-10-28 DIAGNOSIS — K521 Toxic gastroenteritis and colitis: Secondary | ICD-10-CM | POA: Diagnosis present

## 2018-10-28 DIAGNOSIS — L98419 Non-pressure chronic ulcer of buttock with unspecified severity: Secondary | ICD-10-CM | POA: Diagnosis present

## 2018-10-28 DIAGNOSIS — Z95828 Presence of other vascular implants and grafts: Secondary | ICD-10-CM

## 2018-10-28 DIAGNOSIS — A419 Sepsis, unspecified organism: Secondary | ICD-10-CM | POA: Diagnosis not present

## 2018-10-28 DIAGNOSIS — R7989 Other specified abnormal findings of blood chemistry: Secondary | ICD-10-CM | POA: Diagnosis not present

## 2018-10-28 DIAGNOSIS — D61810 Antineoplastic chemotherapy induced pancytopenia: Secondary | ICD-10-CM | POA: Diagnosis present

## 2018-10-28 DIAGNOSIS — D709 Neutropenia, unspecified: Secondary | ICD-10-CM | POA: Diagnosis not present

## 2018-10-28 LAB — COMPREHENSIVE METABOLIC PANEL
ALT: 14 U/L (ref 0–44)
AST: 14 U/L — ABNORMAL LOW (ref 15–41)
Albumin: 3.7 g/dL (ref 3.5–5.0)
Alkaline Phosphatase: 137 U/L — ABNORMAL HIGH (ref 38–126)
Anion gap: 8 (ref 5–15)
BUN: 17 mg/dL (ref 8–23)
CO2: 22 mmol/L (ref 22–32)
Calcium: 6.9 mg/dL — ABNORMAL LOW (ref 8.9–10.3)
Chloride: 108 mmol/L (ref 98–111)
Creatinine, Ser: 0.73 mg/dL (ref 0.44–1.00)
GFR calc Af Amer: >60 mL/min (ref >60)
GFR calc non Af Amer: >60 mL/min (ref >60)
Glucose, Bld: 120 mg/dL — ABNORMAL HIGH (ref 70–99)
Potassium: 3.6 mmol/L (ref 3.5–5.1)
Sodium: 138 mmol/L (ref 135–145)
Total Bilirubin: 0.8 mg/dL (ref 0.3–1.2)
Total Protein: 6.7 g/dL (ref 6.5–8.1)

## 2018-10-28 LAB — CBC WITH DIFFERENTIAL/PLATELET
Abs Immature Granulocytes: 0.1 10*3/uL — ABNORMAL HIGH (ref 0.00–0.07)
Band Neutrophils: 8 %
Basophils Absolute: 0 10*3/uL (ref 0.0–0.1)
Basophils Relative: 0 %
Eosinophils Absolute: 0 10*3/uL (ref 0.0–0.5)
Eosinophils Relative: 0 %
HCT: 22 % — ABNORMAL LOW (ref 36.0–46.0)
Hemoglobin: 7 g/dL — ABNORMAL LOW (ref 12.0–15.0)
Lymphocytes Relative: 9 %
Lymphs Abs: 0.9 10*3/uL (ref 0.7–4.0)
MCH: 28.9 pg (ref 26.0–34.0)
MCHC: 31.8 g/dL (ref 30.0–36.0)
MCV: 90.9 fL (ref 80.0–100.0)
Metamyelocytes Relative: 1 %
Monocytes Absolute: 0.2 10*3/uL (ref 0.1–1.0)
Monocytes Relative: 2 %
Neutro Abs: 9.2 10*3/uL — ABNORMAL HIGH (ref 1.7–7.7)
Neutrophils Relative %: 80 %
Platelets: 57 10*3/uL — ABNORMAL LOW (ref 150–400)
RBC: 2.42 MIL/uL — ABNORMAL LOW (ref 3.87–5.11)
RDW: 22.9 % — ABNORMAL HIGH (ref 11.5–15.5)
Smear Review: NORMAL
WBC: 10.4 10*3/uL (ref 4.0–10.5)
nRBC: 0 % (ref 0.0–0.2)

## 2018-10-28 LAB — ABO/RH: ABO/RH(D): A POS

## 2018-10-28 MED ORDER — SODIUM CHLORIDE 0.9% FLUSH
10.0000 mL | Freq: Once | INTRAVENOUS | Status: AC
  Administered 2018-10-28: 11:00:00 10 mL via INTRAVENOUS
  Filled 2018-10-28: qty 10

## 2018-10-28 MED ORDER — SODIUM CHLORIDE 0.9 % IV SOLN
Freq: Once | INTRAVENOUS | Status: AC
  Administered 2018-10-28: 12:00:00 via INTRAVENOUS
  Filled 2018-10-28: qty 250

## 2018-10-28 MED ORDER — HEPARIN SOD (PORK) LOCK FLUSH 100 UNIT/ML IV SOLN
500.0000 [IU] | Freq: Once | INTRAVENOUS | Status: AC
  Administered 2018-10-28: 500 [IU] via INTRAVENOUS

## 2018-10-28 MED ORDER — SODIUM CHLORIDE 0.9 % IV SOLN: Freq: Once | INTRAVENOUS | Status: DC

## 2018-10-28 MED ORDER — CALCIUM GLUCONATE-NACL 2-0.675 GM/100ML-% IV SOLN
2.0000 g | INTRAVENOUS | Status: AC
  Administered 2018-10-28 (×2): 2000 mg via INTRAVENOUS
  Filled 2018-10-28: qty 100

## 2018-10-28 NOTE — Progress Notes (Signed)
1211: Per Dr. Janese Banks pt to receive 4g IV Calcium today, and Pt to return to clinic for PRBCs 10/29/2018 (Pt aware and agrees to plan). Type and screen and ABO/RH ordered, collected, and sent to lab. Pt educated to keep blue band on for blood transfusion on 10/29/2018. Pt verbalizes understanding.   1538: Pt stable at discharge.

## 2018-10-29 ENCOUNTER — Other Ambulatory Visit: Payer: Self-pay

## 2018-10-29 ENCOUNTER — Inpatient Hospital Stay
Admission: EM | Admit: 2018-10-29 | Discharge: 2018-11-03 | DRG: 871 | Disposition: A | Discharge status: Home Health | Payer: PPO | Source: Ambulatory Visit | Attending: Internal Medicine | Admitting: Internal Medicine

## 2018-10-29 ENCOUNTER — Inpatient Hospital Stay: Payer: PPO

## 2018-10-29 ENCOUNTER — Emergency Department: Payer: PPO

## 2018-10-29 ENCOUNTER — Encounter: Payer: Self-pay | Admitting: Emergency Medicine

## 2018-10-29 ENCOUNTER — Inpatient Hospital Stay (HOSPITAL_BASED_OUTPATIENT_CLINIC_OR_DEPARTMENT_OTHER): Payer: PPO | Admitting: Oncology

## 2018-10-29 DIAGNOSIS — D649 Anemia, unspecified: Secondary | ICD-10-CM

## 2018-10-29 DIAGNOSIS — R7989 Other specified abnormal findings of blood chemistry: Secondary | ICD-10-CM | POA: Diagnosis not present

## 2018-10-29 DIAGNOSIS — L02215 Cutaneous abscess of perineum: Secondary | ICD-10-CM | POA: Diagnosis present

## 2018-10-29 DIAGNOSIS — R748 Abnormal levels of other serum enzymes: Secondary | ICD-10-CM | POA: Diagnosis not present

## 2018-10-29 DIAGNOSIS — A419 Sepsis, unspecified organism: Secondary | ICD-10-CM | POA: Diagnosis present

## 2018-10-29 DIAGNOSIS — Z87891 Personal history of nicotine dependence: Secondary | ICD-10-CM

## 2018-10-29 DIAGNOSIS — C3411 Malignant neoplasm of upper lobe, right bronchus or lung: Secondary | ICD-10-CM | POA: Diagnosis present

## 2018-10-29 DIAGNOSIS — D630 Anemia in neoplastic disease: Secondary | ICD-10-CM | POA: Diagnosis present

## 2018-10-29 DIAGNOSIS — K219 Gastro-esophageal reflux disease without esophagitis: Secondary | ICD-10-CM | POA: Diagnosis present

## 2018-10-29 DIAGNOSIS — Z823 Family history of stroke: Secondary | ICD-10-CM

## 2018-10-29 DIAGNOSIS — M1990 Unspecified osteoarthritis, unspecified site: Secondary | ICD-10-CM | POA: Diagnosis present

## 2018-10-29 DIAGNOSIS — R109 Unspecified abdominal pain: Secondary | ICD-10-CM

## 2018-10-29 DIAGNOSIS — D6481 Anemia due to antineoplastic chemotherapy: Secondary | ICD-10-CM

## 2018-10-29 DIAGNOSIS — R5383 Other fatigue: Secondary | ICD-10-CM | POA: Diagnosis not present

## 2018-10-29 DIAGNOSIS — E785 Hyperlipidemia, unspecified: Secondary | ICD-10-CM | POA: Diagnosis not present

## 2018-10-29 DIAGNOSIS — R5081 Fever presenting with conditions classified elsewhere: Secondary | ICD-10-CM | POA: Diagnosis present

## 2018-10-29 DIAGNOSIS — R000 Tachycardia, unspecified: Secondary | ICD-10-CM

## 2018-10-29 DIAGNOSIS — F329 Major depressive disorder, single episode, unspecified: Secondary | ICD-10-CM | POA: Diagnosis present

## 2018-10-29 DIAGNOSIS — Z20828 Contact with and (suspected) exposure to other viral communicable diseases: Secondary | ICD-10-CM | POA: Diagnosis present

## 2018-10-29 DIAGNOSIS — N764 Abscess of vulva: Secondary | ICD-10-CM | POA: Diagnosis present

## 2018-10-29 DIAGNOSIS — I10 Essential (primary) hypertension: Secondary | ICD-10-CM

## 2018-10-29 DIAGNOSIS — N159 Renal tubulo-interstitial disease, unspecified: Secondary | ICD-10-CM | POA: Diagnosis present

## 2018-10-29 DIAGNOSIS — T451X5A Adverse effect of antineoplastic and immunosuppressive drugs, initial encounter: Secondary | ICD-10-CM | POA: Diagnosis present

## 2018-10-29 DIAGNOSIS — D61810 Antineoplastic chemotherapy induced pancytopenia: Secondary | ICD-10-CM | POA: Diagnosis present

## 2018-10-29 DIAGNOSIS — D61818 Other pancytopenia: Secondary | ICD-10-CM | POA: Diagnosis not present

## 2018-10-29 DIAGNOSIS — K521 Toxic gastroenteritis and colitis: Secondary | ICD-10-CM | POA: Diagnosis present

## 2018-10-29 DIAGNOSIS — Z79899 Other long term (current) drug therapy: Secondary | ICD-10-CM

## 2018-10-29 DIAGNOSIS — Z8249 Family history of ischemic heart disease and other diseases of the circulatory system: Secondary | ICD-10-CM

## 2018-10-29 DIAGNOSIS — K7460 Unspecified cirrhosis of liver: Secondary | ICD-10-CM | POA: Diagnosis present

## 2018-10-29 DIAGNOSIS — K0889 Other specified disorders of teeth and supporting structures: Secondary | ICD-10-CM | POA: Diagnosis not present

## 2018-10-29 DIAGNOSIS — B952 Enterococcus as the cause of diseases classified elsewhere: Secondary | ICD-10-CM | POA: Diagnosis present

## 2018-10-29 DIAGNOSIS — C7951 Secondary malignant neoplasm of bone: Secondary | ICD-10-CM | POA: Diagnosis present

## 2018-10-29 DIAGNOSIS — E876 Hypokalemia: Secondary | ICD-10-CM | POA: Diagnosis not present

## 2018-10-29 DIAGNOSIS — B955 Unspecified streptococcus as the cause of diseases classified elsewhere: Secondary | ICD-10-CM | POA: Diagnosis not present

## 2018-10-29 DIAGNOSIS — R531 Weakness: Secondary | ICD-10-CM

## 2018-10-29 DIAGNOSIS — C7800 Secondary malignant neoplasm of unspecified lung: Secondary | ICD-10-CM | POA: Diagnosis present

## 2018-10-29 DIAGNOSIS — C3490 Malignant neoplasm of unspecified part of unspecified bronchus or lung: Secondary | ICD-10-CM | POA: Diagnosis not present

## 2018-10-29 DIAGNOSIS — Z9221 Personal history of antineoplastic chemotherapy: Secondary | ICD-10-CM

## 2018-10-29 DIAGNOSIS — Z9049 Acquired absence of other specified parts of digestive tract: Secondary | ICD-10-CM

## 2018-10-29 DIAGNOSIS — A408 Other streptococcal sepsis: Secondary | ICD-10-CM | POA: Diagnosis present

## 2018-10-29 DIAGNOSIS — R112 Nausea with vomiting, unspecified: Secondary | ICD-10-CM

## 2018-10-29 DIAGNOSIS — L98419 Non-pressure chronic ulcer of buttock with unspecified severity: Secondary | ICD-10-CM | POA: Diagnosis present

## 2018-10-29 DIAGNOSIS — D709 Neutropenia, unspecified: Secondary | ICD-10-CM | POA: Diagnosis not present

## 2018-10-29 DIAGNOSIS — I340 Nonrheumatic mitral (valve) insufficiency: Secondary | ICD-10-CM | POA: Diagnosis not present

## 2018-10-29 DIAGNOSIS — I361 Nonrheumatic tricuspid (valve) insufficiency: Secondary | ICD-10-CM | POA: Diagnosis not present

## 2018-10-29 DIAGNOSIS — Z8041 Family history of malignant neoplasm of ovary: Secondary | ICD-10-CM

## 2018-10-29 DIAGNOSIS — Z923 Personal history of irradiation: Secondary | ICD-10-CM

## 2018-10-29 DIAGNOSIS — R7881 Bacteremia: Secondary | ICD-10-CM | POA: Diagnosis not present

## 2018-10-29 LAB — CBC WITH DIFFERENTIAL/PLATELET
Abs Immature Granulocytes: 0.01 10*3/uL (ref 0.00–0.07)
Abs Immature Granulocytes: 0.16 10*3/uL — ABNORMAL HIGH (ref 0.00–0.07)
Basophils Absolute: 0 10*3/uL (ref 0.0–0.1)
Basophils Absolute: 0 10*3/uL (ref 0.0–0.1)
Basophils Relative: 3 %
Basophils Relative: 3 %
Eosinophils Absolute: 0 10*3/uL (ref 0.0–0.5)
Eosinophils Absolute: 0 10*3/uL (ref 0.0–0.5)
Eosinophils Relative: 0 %
Eosinophils Relative: 0 %
HCT: 21.4 % — ABNORMAL LOW (ref 36.0–46.0)
HCT: 19.6 % — ABNORMAL LOW (ref 36.0–46.0)
Hemoglobin: 6.6 g/dL — ABNORMAL LOW (ref 12.0–15.0)
Hemoglobin: 6.2 g/dL — ABNORMAL LOW (ref 12.0–15.0)
Immature Granulocytes: 2 %
Immature Granulocytes: 21 %
Lymphocytes Relative: 26 %
Lymphocytes Relative: 4 %
Lymphs Abs: 0.2 10*3/uL — ABNORMAL LOW (ref 0.7–4.0)
Lymphs Abs: 0 10*3/uL — ABNORMAL LOW (ref 0.7–4.0)
MCH: 28.6 pg (ref 26.0–34.0)
MCH: 28.7 pg (ref 26.0–34.0)
MCHC: 30.8 g/dL (ref 30.0–36.0)
MCHC: 31.6 g/dL (ref 30.0–36.0)
MCV: 92.6 fL (ref 80.0–100.0)
MCV: 90.7 fL (ref 80.0–100.0)
Monocytes Absolute: 0 10*3/uL — ABNORMAL LOW (ref 0.1–1.0)
Monocytes Absolute: 0 10*3/uL — ABNORMAL LOW (ref 0.1–1.0)
Monocytes Relative: 3 %
Monocytes Relative: 1 %
Neutro Abs: 0.4 10*3/uL — ABNORMAL LOW (ref 1.7–7.7)
Neutro Abs: 0.6 10*3/uL — ABNORMAL LOW (ref 1.7–7.7)
Neutrophils Relative %: 66 %
Neutrophils Relative %: 71 %
Platelets: 42 10*3/uL — ABNORMAL LOW (ref 150–400)
Platelets: 28 10*3/uL — CL (ref 150–400)
RBC: 2.31 MIL/uL — ABNORMAL LOW (ref 3.87–5.11)
RBC: 2.16 MIL/uL — ABNORMAL LOW (ref 3.87–5.11)
RDW: 22.6 % — ABNORMAL HIGH (ref 11.5–15.5)
RDW: 22.4 % — ABNORMAL HIGH (ref 11.5–15.5)
Smear Review: DECREASED
Smear Review: DECREASED
WBC: 0.7 10*3/uL — CL (ref 4.0–10.5)
WBC: 0.8 10*3/uL — CL (ref 4.0–10.5)
nRBC: 0 % (ref 0.0–0.2)
nRBC: 0 % (ref 0.0–0.2)

## 2018-10-29 LAB — URINALYSIS, COMPLETE (UACMP) WITH MICROSCOPIC
Bilirubin Urine: NEGATIVE
Glucose, UA: NEGATIVE mg/dL
Ketones, ur: NEGATIVE mg/dL
Nitrite: NEGATIVE
Protein, ur: 100 mg/dL — AB
RBC / HPF: >50 RBC/hpf — ABNORMAL HIGH (ref 0–5)
Specific Gravity, Urine: 1.018 (ref 1.005–1.030)
Squamous Epithelial / HPF: NONE SEEN (ref 0–5)
WBC, UA: >50 WBC/hpf — ABNORMAL HIGH (ref 0–5)
pH: 6 (ref 5.0–8.0)

## 2018-10-29 LAB — COMPREHENSIVE METABOLIC PANEL
ALT: 44 U/L (ref 0–44)
ALT: 47 U/L — ABNORMAL HIGH (ref 0–44)
AST: 61 U/L — ABNORMAL HIGH (ref 15–41)
AST: 60 U/L — ABNORMAL HIGH (ref 15–41)
Albumin: 3.7 g/dL (ref 3.5–5.0)
Albumin: 3.4 g/dL — ABNORMAL LOW (ref 3.5–5.0)
Alkaline Phosphatase: 174 U/L — ABNORMAL HIGH (ref 38–126)
Alkaline Phosphatase: 158 U/L — ABNORMAL HIGH (ref 38–126)
Anion gap: 15 (ref 5–15)
Anion gap: 9 (ref 5–15)
BUN: 17 mg/dL (ref 8–23)
BUN: 18 mg/dL (ref 8–23)
CO2: 16 mmol/L — ABNORMAL LOW (ref 22–32)
CO2: 20 mmol/L — ABNORMAL LOW (ref 22–32)
Calcium: 8 mg/dL — ABNORMAL LOW (ref 8.9–10.3)
Calcium: 7.8 mg/dL — ABNORMAL LOW (ref 8.9–10.3)
Chloride: 106 mmol/L (ref 98–111)
Chloride: 109 mmol/L (ref 98–111)
Creatinine, Ser: 0.81 mg/dL (ref 0.44–1.00)
Creatinine, Ser: 0.71 mg/dL (ref 0.44–1.00)
GFR calc Af Amer: >60 mL/min (ref >60)
GFR calc Af Amer: >60 mL/min (ref >60)
GFR calc non Af Amer: >60 mL/min (ref >60)
GFR calc non Af Amer: >60 mL/min (ref >60)
Glucose, Bld: 142 mg/dL — ABNORMAL HIGH (ref 70–99)
Glucose, Bld: 94 mg/dL (ref 70–99)
Potassium: 3.9 mmol/L (ref 3.5–5.1)
Potassium: 3.5 mmol/L (ref 3.5–5.1)
Sodium: 137 mmol/L (ref 135–145)
Sodium: 138 mmol/L (ref 135–145)
Total Bilirubin: 2.1 mg/dL — ABNORMAL HIGH (ref 0.3–1.2)
Total Bilirubin: 2.3 mg/dL — ABNORMAL HIGH (ref 0.3–1.2)
Total Protein: 6.9 g/dL (ref 6.5–8.1)
Total Protein: 6.4 g/dL — ABNORMAL LOW (ref 6.5–8.1)

## 2018-10-29 LAB — TYPE AND SCREEN
ABO/RH(D): A POS
Antibody Screen: NEGATIVE
Unit division: 0
Unit division: 0

## 2018-10-29 LAB — PROTIME-INR
INR: 1.3 — ABNORMAL HIGH (ref 0.8–1.2)
Prothrombin Time: 15.7 seconds — ABNORMAL HIGH (ref 11.4–15.2)

## 2018-10-29 LAB — BPAM RBC
Blood Product Expiration Date: 202010012359
Blood Product Expiration Date: 202010202359
Unit Type and Rh: 600
Unit Type and Rh: 6200

## 2018-10-29 LAB — PATHOLOGIST SMEAR REVIEW

## 2018-10-29 LAB — LACTIC ACID, PLASMA
Lactic Acid, Venous: 3 mmol/L (ref 0.5–1.9)
Lactic Acid, Venous: 4.1 mmol/L (ref 0.5–1.9)
Lactic Acid, Venous: 1.3 mmol/L (ref 0.5–1.9)

## 2018-10-29 LAB — SARS CORONAVIRUS 2 BY RT PCR (HOSPITAL ORDER, PERFORMED IN ~~LOC~~ HOSPITAL LAB): SARS Coronavirus 2: NEGATIVE

## 2018-10-29 LAB — PROCALCITONIN: Procalcitonin: 23.07 ng/mL

## 2018-10-29 LAB — LACTATE DEHYDROGENASE: LDH: 102 U/L (ref 98–192)

## 2018-10-29 LAB — BILIRUBIN, FRACTIONATED(TOT/DIR/INDIR)
Bilirubin, Direct: 0.5 mg/dL — ABNORMAL HIGH (ref 0.0–0.2)
Indirect Bilirubin: 1 mg/dL — ABNORMAL HIGH (ref 0.3–0.9)
Total Bilirubin: 1.5 mg/dL — ABNORMAL HIGH (ref 0.3–1.2)

## 2018-10-29 LAB — PREPARE RBC (CROSSMATCH)

## 2018-10-29 LAB — APTT: aPTT: 29 seconds (ref 24–36)

## 2018-10-29 LAB — LIPASE, BLOOD: Lipase: 29 U/L (ref 11–51)

## 2018-10-29 MED ORDER — FOLIC ACID 1 MG PO TABS
1.0000 mg | ORAL_TABLET | Freq: Every day | ORAL | Status: DC
  Administered 2018-10-30 – 2018-11-03 (×5): 1 mg via ORAL
  Filled 2018-10-29 (×5): qty 1

## 2018-10-29 MED ORDER — ONDANSETRON HCL 4 MG/2ML IJ SOLN
8.0000 mg | Freq: Once | INTRAMUSCULAR | Status: AC
  Administered 2018-10-29: 8 mg via INTRAVENOUS
  Filled 2018-10-29: qty 4

## 2018-10-29 MED ORDER — SODIUM CHLORIDE 0.9 % IV BOLUS
1000.0000 mL | Freq: Once | INTRAVENOUS | Status: AC
  Administered 2018-10-29: 1000 mL via INTRAVENOUS

## 2018-10-29 MED ORDER — OXYCODONE HCL 5 MG PO TABS: 5.0000 mg | ORAL_TABLET | ORAL | Status: DC | PRN

## 2018-10-29 MED ORDER — ONDANSETRON HCL 4 MG/2ML IJ SOLN
INTRAMUSCULAR | Status: AC
  Administered 2018-10-29: 4 mg
  Filled 2018-10-29: qty 2

## 2018-10-29 MED ORDER — DEXAMETHASONE SODIUM PHOSPHATE 10 MG/ML IJ SOLN
INTRAMUSCULAR | Status: AC
  Filled 2018-10-29: qty 1

## 2018-10-29 MED ORDER — SODIUM CHLORIDE 0.9% IV SOLUTION
250.0000 mL | Freq: Once | INTRAVENOUS | Status: AC
  Administered 2018-10-29: 250 mL via INTRAVENOUS
  Filled 2018-10-29: qty 250

## 2018-10-29 MED ORDER — SODIUM CHLORIDE 0.9 % IV SOLN
2.0000 g | Freq: Once | INTRAVENOUS | Status: AC
  Administered 2018-10-29: 2 g via INTRAVENOUS
  Filled 2018-10-29: qty 2

## 2018-10-29 MED ORDER — LOPERAMIDE HCL 2 MG PO CAPS
2.0000 mg | ORAL_CAPSULE | ORAL | Status: DC | PRN
  Administered 2018-11-02: 09:00:00 2 mg via ORAL
  Filled 2018-10-29: qty 1

## 2018-10-29 MED ORDER — DEXAMETHASONE SODIUM PHOSPHATE 10 MG/ML IJ SOLN
10.0000 mg | Freq: Once | INTRAMUSCULAR | Status: AC
  Administered 2018-10-29: 10 mg via INTRAVENOUS
  Filled 2018-10-29: qty 1

## 2018-10-29 MED ORDER — POLYETHYLENE GLYCOL 3350 17 G PO PACK: 17.0000 g | PACK | Freq: Every day | ORAL | Status: DC | PRN

## 2018-10-29 MED ORDER — CHLORHEXIDINE GLUCONATE CLOTH 2 % EX PADS
6.0000 | MEDICATED_PAD | Freq: Every day | CUTANEOUS | Status: DC
  Administered 2018-10-30 – 2018-11-03 (×4): 6 via TOPICAL

## 2018-10-29 MED ORDER — VANCOMYCIN HCL 1.5 G IV SOLR
1500.0000 mg | Freq: Once | INTRAVENOUS | Status: AC
  Administered 2018-10-29: 1500 mg via INTRAVENOUS
  Filled 2018-10-29: qty 1500

## 2018-10-29 MED ORDER — VANCOMYCIN HCL IN DEXTROSE 1-5 GM/200ML-% IV SOLN: 1000.0000 mg | Freq: Once | INTRAVENOUS | Status: DC

## 2018-10-29 MED ORDER — SODIUM CHLORIDE 0.9 % IV SOLN
2.0000 g | Freq: Three times a day (TID) | INTRAVENOUS | Status: DC
  Administered 2018-10-29: 2 g via INTRAVENOUS
  Filled 2018-10-29 (×3): qty 2

## 2018-10-29 MED ORDER — OCUVITE-LUTEIN PO CAPS
1.0000 | ORAL_CAPSULE | Freq: Every day | ORAL | Status: DC
  Administered 2018-10-30 – 2018-11-03 (×5): 1 via ORAL
  Filled 2018-10-29 (×6): qty 1

## 2018-10-29 MED ORDER — VANCOMYCIN HCL 1.5 G IV SOLR
1500.0000 mg | INTRAVENOUS | Status: DC
  Filled 2018-10-29: qty 1500

## 2018-10-29 MED ORDER — SODIUM CHLORIDE 0.9 % IV SOLN: 10.0000 mL/h | Freq: Once | INTRAVENOUS | Status: DC

## 2018-10-29 MED ORDER — ONDANSETRON HCL 4 MG PO TABS
4.0000 mg | ORAL_TABLET | Freq: Four times a day (QID) | ORAL | Status: DC | PRN
  Administered 2018-10-30: 4 mg via ORAL
  Filled 2018-10-29: qty 1

## 2018-10-29 MED ORDER — SODIUM CHLORIDE 0.9 % IV SOLN
INTRAVENOUS | Status: DC
  Administered 2018-10-29 – 2018-11-01 (×5): via INTRAVENOUS

## 2018-10-29 MED ORDER — BUPROPION HCL ER (XL) 150 MG PO TB24
300.0000 mg | ORAL_TABLET | Freq: Every day | ORAL | Status: DC
  Administered 2018-10-30 – 2018-11-03 (×5): 300 mg via ORAL
  Filled 2018-10-29 (×6): qty 2

## 2018-10-29 MED ORDER — PANTOPRAZOLE SODIUM 40 MG PO TBEC
40.0000 mg | DELAYED_RELEASE_TABLET | Freq: Two times a day (BID) | ORAL | Status: DC
  Administered 2018-10-30 – 2018-11-03 (×8): 40 mg via ORAL
  Filled 2018-10-29 (×8): qty 1

## 2018-10-29 MED ORDER — ONDANSETRON HCL 4 MG/2ML IJ SOLN: 4.0000 mg | Freq: Four times a day (QID) | INTRAMUSCULAR | Status: DC | PRN

## 2018-10-29 MED ORDER — ROSUVASTATIN CALCIUM 20 MG PO TABS
20.0000 mg | ORAL_TABLET | Freq: Every day | ORAL | Status: DC
  Administered 2018-10-29 – 2018-11-03 (×6): 20 mg via ORAL
  Filled 2018-10-29 (×6): qty 1

## 2018-10-29 MED ORDER — ACETAMINOPHEN 650 MG RE SUPP: 650.0000 mg | Freq: Four times a day (QID) | RECTAL | Status: DC | PRN

## 2018-10-29 MED ORDER — METRONIDAZOLE IN NACL 5-0.79 MG/ML-% IV SOLN
500.0000 mg | Freq: Three times a day (TID) | INTRAVENOUS | Status: DC
  Administered 2018-10-29 – 2018-10-30 (×2): 500 mg via INTRAVENOUS
  Filled 2018-10-29 (×3): qty 100

## 2018-10-29 MED ORDER — SODIUM CHLORIDE 0.9% FLUSH
3.0000 mL | INTRAVENOUS | Status: DC | PRN
  Filled 2018-10-29: qty 3

## 2018-10-29 MED ORDER — SODIUM CHLORIDE 0.9 % IV BOLUS
1000.0000 mL | Freq: Once | INTRAVENOUS | Status: AC
  Administered 2018-10-29: 22:00:00 1000 mL via INTRAVENOUS

## 2018-10-29 MED ORDER — HEPARIN SOD (PORK) LOCK FLUSH 100 UNIT/ML IV SOLN
500.0000 [IU] | Freq: Every day | INTRAVENOUS | Status: DC | PRN
  Filled 2018-10-29: qty 5

## 2018-10-29 MED ORDER — ONDANSETRON HCL 4 MG/2ML IJ SOLN
INTRAMUSCULAR | Status: AC
  Filled 2018-10-29: qty 4

## 2018-10-29 MED ORDER — METRONIDAZOLE IN NACL 5-0.79 MG/ML-% IV SOLN
500.0000 mg | Freq: Once | INTRAVENOUS | Status: AC
  Administered 2018-10-29: 500 mg via INTRAVENOUS
  Filled 2018-10-29: qty 100

## 2018-10-29 MED ORDER — LORAZEPAM 0.5 MG PO TABS: 0.5000 mg | ORAL_TABLET | Freq: Four times a day (QID) | ORAL | Status: DC | PRN

## 2018-10-29 MED ORDER — ACETAMINOPHEN 325 MG PO TABS
650.0000 mg | ORAL_TABLET | Freq: Four times a day (QID) | ORAL | Status: DC | PRN
  Administered 2018-10-30: 650 mg via ORAL
  Filled 2018-10-29: qty 2

## 2018-10-29 NOTE — H&P (Addendum)
[facility] [place] NAME: Anne [name]    MR#:  [id number]  DATE OF BIRTH:  1950/05/21  DATE OF ADMISSION:  10/29/2018  PRIMARY CARE PHYSICIAN: Leone Haven, MD   REQUESTING/REFERRING PHYSICIAN: Carrie Mew, MD  CHIEF COMPLAINT:   Chief Complaint  Patient presents with  . Emesis    HISTORY OF PRESENT ILLNESS:  Anne [name]  is a 68 y.o. 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  Patient has had a couple episodes of vomiting.  No hematemesis.  She was just received chemotherapy yesterday, but her calcium was too low.  She received 4 g IV calcium and was told to return to the cancer center today for a blood transfusion.  When she arrived at the cancer center today, she was feeling sick and shaky.  She was also vomiting and said she was not feeling well, so she was sent to the ED for further evaluation.  Patient denies any abdominal pain, diarrhea, or constipation.  She denies any fevers or chills.  She denies any dysuria, urinary frequency, or urinary urgency.  She does state that she has a left labial boil that has been there for the last 3 days.  The boil is decreasing in size.  She denies any shortness of breath or cough.  In the ED, patient was meeting sepsis criteria with tachycardia, tachypnea, leukopenia, and lactic acidosis. Labs were significant for alkaline phosphatase 158, AST 60, ALT 47, total bilirubin 2.3, lactic acodi 3.0, procalcitonin 23, WBC 0.8, hemoglobin 6.2, platelets 28. UA with moderate LE, negative nitrites, many bacteria. Blood and urine cultures were drawn. Patient was started on broad spectrum antibiotics. Hospitalists were called for admission.  PAST MEDICAL HISTORY:   Past Medical History:  Diagnosis Date  . Arthritis   . Cirrhosis ([place])   .  Depression   . GERD (gastroesophageal reflux disease)   . Hyperlipidemia   . Hypertension   . Lung cancer ([place])   . Metastatic bone cancer ([place])     PAST SURGICAL HISTORY:   Past Surgical History:  Procedure Laterality Date  . APPENDECTOMY  1971  . CHOLECYSTECTOMY  1971  . COLONOSCOPY WITH PROPOFOL N/A 11/15/2016   Procedure: COLONOSCOPY WITH PROPOFOL;  Surgeon: Jonathon Bellows, MD;  Location[facility] ENDOSCOPY;  Service: Gastroenterology;  Laterality: N/A;  . ENDOBRONCHIAL ULTRASOUND Right 07/30/2018   Procedure: ENDOBRONCHIAL ULTRASOUND;  Surgeon: Laverle Hobby, MD;  Location: [facility] ORS;  Service: Pulmonary;  Laterality: Right;  . ESOPHAGOGASTRODUODENOSCOPY (EGD) WITH PROPOFOL N/A 01/07/2017   Procedure: ESOPHAGOGASTRODUODENOSCOPY (EGD) WITH PROPOFOL;  Surgeon: Jonathon Bellows, MD;  Location: [facility] ENDOSCOPY;  Service: Gastroenterology;  Laterality: N/A;  . LAPAROSCOPY N/A 03/01/2017   Procedure: LAPAROSCOPY DIAGNOSTIC;  Surgeon: Robert Bellow, MD;  Location: [facility] ORS;  Service: General;  Laterality: N/A;  . PORTA CATH INSERTION N/A 08/14/2018   Procedure: PORTA CATH INSERTION;  Surgeon: Algernon Huxley, MD;  Location: [place] [facility] LAB;  Service: Cardiovascular;  Laterality: N/A;  . TONSILECTOMY, ADENOIDECTOMY, BILATERAL MYRINGOTOMY [place]  . TONSILLECTOMY    . VENTRAL HERNIA REPAIR N/A 03/01/2017   10 x 14 CM Ventralight ST mesh, intraperitoneal location.   . VENTRAL HERNIA REPAIR N/A 03/01/2017   Procedure: HERNIA REPAIR VENTRAL ADULT;  Surgeon: Robert Bellow, MD;  Location: [facility] ORS;  Service:  General;  Laterality: N/A;    SOCIAL HISTORY:   Social History   Tobacco Use  . Smoking status: Former Smoker    Packs/day: 2.00    Years: 50.00    Pack years: 100.00    Types: Cigarettes, E-cigarettes    Quit date: 02/07/2012    Years since quitting: 6.7  . Smokeless tobacco: Never Used  Substance Use Topics  . Alcohol use: No    Alcohol/week: 0.0 standard drinks     FAMILY HISTORY:   Family History  Problem Relation Age of Onset  . Hypertension Mother   . Ovarian cancer Mother 58  . Heart disease Father   . Stroke Father   . Ovarian cancer Sister        ? dx cancer had hyst.  . Breast cancer Neg Hx     DRUG ALLERGIES:   Allergies  Allergen Reactions  . Bee Venom Swelling    REVIEW OF SYSTEMS:   Review of Systems  Constitutional: Negative for chills and fever.  HENT: Negative for congestion and sore throat.   Eyes: Negative for blurred vision and double vision.  Respiratory: Negative for cough and shortness of breath.   Cardiovascular: Negative for chest pain and palpitations.  Gastrointestinal: Positive for nausea and vomiting. Negative for abdominal pain, blood in stool, constipation, diarrhea and melena.  Genitourinary: Negative for dysuria, frequency and urgency.  Musculoskeletal: Negative for back pain and neck pain.  Neurological: Negative for dizziness and headaches.  Psychiatric/Behavioral: The patient is not nervous/anxious.     MEDICATIONS AT HOME:   Prior to Admission medications   Medication Sig Start Date End Date Taking? Authorizing Provider  buPROPion (WELLBUTRIN XL) 300 MG 24 hr tablet Take 1 tablet by mouth once daily Patient taking differently: Take 300 mg by mouth daily.  09/01/18  Yes Leone Haven, MD  folic acid (FOLVITE) 1 MG tablet Take 1 tablet (1 mg total) by mouth daily. 10/02/18  Yes Sindy Guadeloupe, MD  Multiple Vitamins-Minerals (PRESERVISION AREDS 2) CAPS Take 1 capsule by mouth 2 (two) times a day.    Yes [provider]  pantoprazole (PROTONIX) 40 MG tablet TAKE 1 TABLET BY MOUTH TWICE DAILY BEFORE A MEAL Patient taking differently: Take 40 mg by mouth 2 (two) times daily before a meal.  08/20/18  Yes Leone Haven, MD  potassium chloride SA (K-DUR) 20 MEQ tablet Take 1 tablet (20 mEq total) by mouth 2 (two) times daily. 10/17/18  Yes Sindy Guadeloupe, MD  rosuvastatin (CRESTOR) 20 MG  tablet TAKE 1 TABLET BY MOUTH ONCE DAILY Patient taking differently: Take 20 mg by mouth daily.  12/11/17  Yes Leone Haven, MD  escitalopram (LEXAPRO) 10 MG tablet Take 1 tablet (10 mg total) by mouth daily. Patient not taking: Reported on 10/29/2018 02/26/18   Leone Haven, MD  ibuprofen (ADVIL,MOTRIN) 200 MG tablet Take 400 mg by mouth every 8 (eight) hours as needed for headache or moderate pain.     [provider]  lidocaine-prilocaine (EMLA) cream Apply to affected area once 08/06/18   Sindy Guadeloupe, MD  loperamide (IMODIUM) 2 MG capsule Take 1 capsule (2 mg total) by mouth every 2 (two) hours as needed for diarrhea or loose stools. Do not exceed 16mg  daily. 08/28/18   Sindy Guadeloupe, MD  LORazepam (ATIVAN) 0.5 MG tablet Take 1 tablet (0.5 mg total) by mouth every 6 (six) hours as needed (Nausea or vomiting). 08/06/18   Randa Evens  C, MD  ondansetron (ZOFRAN) 8 MG tablet Take 1 tablet (8 mg total) by mouth 2 (two) times daily as needed for refractory nausea / vomiting. Start on day 3 after carboplatin chemo. 08/06/18   Sindy Guadeloupe, MD  oxyCODONE (OXY IR/ROXICODONE) 5 MG immediate release tablet Take 1 tablet (5 mg total) by mouth every 6 (six) hours as needed for severe pain. Patient not taking: Reported on 10/29/2018 07/08/18   Sindy Guadeloupe, MD  prochlorperazine (COMPAZINE) 10 MG tablet Take 1 tablet (10 mg total) by mouth every 6 (six) hours as needed (Nausea or vomiting). 08/06/18   Sindy Guadeloupe, MD      VITAL SIGNS:  Blood pressure 103/74, pulse 83, temperature 100 F (37.8 C), temperature source Oral, resp. rate 15, height 5\' 5"  (1.651 m), weight 73.5 kg, SpO2 (!) 64 %.  PHYSICAL EXAMINATION:  Physical Exam  GENERAL:  68 y.o.-year-old patient lying in the bed with no acute distress. + Chronically ill-appearing. EYES: Pupils equal, round, reactive to light and accommodation. No scleral icterus. Extraocular muscles intact.  HEENT: Head atraumatic, normocephalic.  Oropharynx and nasopharynx clear.  NECK:  Supple, no jugular venous distention. No thyroid enlargement, no tenderness.  LUNGS: Normal breath sounds bilaterally, no wheezing, rales,rhonchi or crepitation. No use of accessory muscles of respiration.  Nasal cannula in place.  Able to speak in full sentences. CARDIOVASCULAR: RRR, S1, S2 normal. No murmurs, rubs, or gallops.  ABDOMEN: Soft, nontender, nondistended. Bowel sounds present. No organomegaly or mass.  GENITOURINARY: +small abscess present on the left inner labia, no drainage. Abscess is mildly tender to palpation. EXTREMITIES: No pedal edema, cyanosis, or clubbing.  NEUROLOGIC: Cranial nerves II through XII are intact. + Global weakness. Sensation intact. Gait not checked.  PSYCHIATRIC: The patient is alert and oriented x 3.  SKIN: No obvious rash, lesion, or ulcer.   LABORATORY PANEL:   CBC Recent Labs  Lab 10/29/18 1056  WBC 0.8*  HGB 6.2*  HCT 19.6*  PLT 28*   ------------------------------------------------------------------------------------------------------------------  Chemistries  Recent Labs  Lab 10/29/18 1056  NA 138  K 3.5  CL 109  CO2 20*  GLUCOSE 94  BUN 18  CREATININE 0.71  CALCIUM 7.8*  AST 60*  ALT 47*  ALKPHOS 158*  BILITOT 2.3*   ------------------------------------------------------------------------------------------------------------------  Cardiac Enzymes No results for input(s): TROPONINI in the last 168 hours. ------------------------------------------------------------------------------------------------------------------  RADIOLOGY:  No results found.    IMPRESSION AND PLAN:   Sepsis- possibly due to UTI (although patient denies any urinary symptoms) versus left labial abscess (although abscess seems to be resolving).  Meeting sepsis criteria on admission with tachycardia, tachypnea, leukopenia, and lactic acidosis. -COVID test was negative -Continue broad-spectrum antibiotics  -Follow-up blood and urine cultures -CXR is pending, but patient denies respiratory symptoms -Check RVP -Will check a RUQ [place], given her elevated liver enzymes -Continue IVFs -Trend lactic acid  Pancytopenia- likely due to chemotherapy -1 unit PRBC ordered -Oncology consulted- Dr. 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 Janese Banks as an outpatient -Oncology consult  Elevated liver enzymes / hyperbilirubinemia- may be due to sepsis -Check RUQ [place] -Check fractionated bilirubin, LDH, haptoglobin  Hyperlipidemia-stable -Continue home Crestor  Depression-stable -Continue home Wellbutrin  GERD- stable -Continue home PPI  DVT prophylaxis- SCDs due to thrombocytopenia  All the records are reviewed and case discussed with ED provider. Management plans discussed with the patient, family and they are in  agreement.  CODE STATUS: Full  TOTAL TIME TAKING CARE OF THIS PATIENT: 45 minutes.    Berna Spare Mayo M.D on 10/29/2018 at 3:12 PM  Between 7am to 6pm - Pager [phone]  After 6pm go to [email or web address] - [employer] [facility]  Office  [phone]  CC: Primary care physician; Leone Haven, MD   Note: This dictation was prepared with Dragon dictation along with smaller phrase technology. Any transcriptional errors that result from this process are unintentional.

## 2018-10-29 NOTE — ED Provider Notes (Signed)
[facility] Emergency Department Provider Note  ____________________________________________  Time seen: Approximately 11:59 AM  I have reviewed the triage vital signs and the nursing notes.   HISTORY  Chief Complaint Emesis    HPI TYSHAWN KEEL is a 68 y.o. female with a history of cirrhosis, GERD, hypertension, lung cancer who comes to the ED due to vomiting that started while over in the infusion center.  Review of electronic medical record shows that she had been scheduled to come for a red blood cell transfusion due to anemia.  She reports that since yesterday she has been having malaise and chills.  Denies chest pain shortness of breath body aches abdominal pain.  No fevers, no sweats, no dizziness.  Symptoms are waxing and waning no aggravating or alleviating factors.      Past Medical History:  Diagnosis Date  . Arthritis   . Cirrhosis ([place])   . Depression   . GERD (gastroesophageal reflux disease)   . Hyperlipidemia   . Hypertension   . Lung cancer ([place])   . Metastatic bone cancer [facility])      Patient Active Problem List   Diagnosis Date Noted  . Goals of care, counseling/discussion 08/06/2018  . Small cell lung cancer, right upper lobe ([place]) 08/06/2018  . Bone metastases ([place]) 08/06/2018  . Bleeding nose 02/26/2018  . Vision changes 02/26/2018  . Skin tear of forearm without complication, initial encounter 12/06/2017  . Bruising 12/06/2017  . Obesity (BMI 30.0-34.9) 08/05/2017  . Ventral hernia 03/01/2017  . Abdominal wall hernia 02/15/2017  . Lung nodule < 6cm on CT 02/15/2017  . Anemia 01/23/2017  . Cirrhosis of liver ([place]) 01/23/2017  . Gastritis 01/23/2017  . Chest pain 01/03/2017  . Hypokalemia 01/03/2017  . Weight loss 12/13/2016  . Callus of foot 07/27/2016  . Heart murmur 07/27/2016  . Right calf pain 04/20/2016  . Chronic low back pain 08/15/2015  . Right shoulder pain 06/14/2015  . Benign essential HTN 02/22/2015  .  HLD (hyperlipidemia) 02/22/2015  . Depression 02/22/2015     Past Surgical History:  Procedure Laterality Date  . APPENDECTOMY  1971  . CHOLECYSTECTOMY  1971  . COLONOSCOPY WITH PROPOFOL N/A 11/15/2016   Procedure: COLONOSCOPY WITH PROPOFOL;  Surgeon: Jonathon Bellows, MD;  Location: [facility] ENDOSCOPY;  Service: Gastroenterology;  Laterality: N/A;  . ENDOBRONCHIAL ULTRASOUND Right 07/30/2018   Procedure: ENDOBRONCHIAL ULTRASOUND;  Surgeon: Laverle Hobby, MD;  Location: [facility] ORS;  Service: Pulmonary;  Laterality: Right;  . ESOPHAGOGASTRODUODENOSCOPY (EGD) WITH PROPOFOL N/A 01/07/2017   Procedure: ESOPHAGOGASTRODUODENOSCOPY (EGD) WITH PROPOFOL;  Surgeon: Jonathon Bellows, MD;  Location: [facility] ENDOSCOPY;  Service: Gastroenterology;  Laterality: N/A;  . LAPAROSCOPY N/A 03/01/2017   Procedure: LAPAROSCOPY DIAGNOSTIC;  Surgeon: Robert Bellow, MD;  Location: [facility] ORS;  Service: General;  Laterality: N/A;  . PORTA CATH INSERTION N/A 08/14/2018   Procedure: PORTA CATH INSERTION;  Surgeon: Algernon Huxley, MD;  Location: [place] CV LAB;  Service: Cardiovascular;  Laterality: N/A;  . TONSILECTOMY, ADENOIDECTOMY, BILATERAL MYRINGOTOMY [place]  . TONSILLECTOMY    . VENTRAL HERNIA REPAIR N/A 03/01/2017   10 x 14 CM Ventralight ST mesh, intraperitoneal location.   . VENTRAL HERNIA REPAIR N/A 03/01/2017   Procedure: HERNIA REPAIR VENTRAL ADULT;  Surgeon: Robert Bellow, MD;  Location: [facility] ORS;  Service: General;  Laterality: N/A;     Prior to Admission medications   Medication Sig Start Date End Date Taking? Authorizing Provider  buPROPion [facility]  XL) 300 MG 24 hr tablet Take 1 tablet by mouth once daily Patient taking differently: Take 300 mg by mouth daily.  09/01/18  Yes Leone Haven, MD  folic acid (FOLVITE) 1 MG tablet Take 1 tablet (1 mg total) by mouth daily. 10/02/18  Yes Sindy Guadeloupe, MD  Multiple Vitamins-Minerals (PRESERVISION AREDS 2) CAPS Take 1 capsule by mouth 2  (two) times a day.    Yes [provider]  pantoprazole (PROTONIX) 40 MG tablet TAKE 1 TABLET BY MOUTH TWICE DAILY BEFORE A MEAL Patient taking differently: Take 40 mg by mouth 2 (two) times daily before a meal.  08/20/18  Yes Leone Haven, MD  potassium chloride SA (K-DUR) 20 MEQ tablet Take 1 tablet (20 mEq total) by mouth 2 (two) times daily. 10/17/18  Yes Sindy Guadeloupe, MD  rosuvastatin (CRESTOR) 20 MG tablet TAKE 1 TABLET BY MOUTH ONCE DAILY Patient taking differently: Take 20 mg by mouth daily.  12/11/17  Yes Leone Haven, MD  escitalopram (LEXAPRO) 10 MG tablet Take 1 tablet (10 mg total) by mouth daily. Patient not taking: Reported on 10/29/2018 02/26/18   Leone Haven, MD  ibuprofen (ADVIL,MOTRIN) 200 MG tablet Take 400 mg by mouth every 8 (eight) hours as needed for headache or moderate pain.     [provider]  lidocaine-prilocaine (EMLA) cream Apply to affected area once 08/06/18   Sindy Guadeloupe, MD  loperamide (IMODIUM) 2 MG capsule Take 1 capsule (2 mg total) by mouth every 2 (two) hours as needed for diarrhea or loose stools. Do not exceed 16mg  daily. 08/28/18   Sindy Guadeloupe, MD  LORazepam (ATIVAN) 0.5 MG tablet Take 1 tablet (0.5 mg total) by mouth every 6 (six) hours as needed (Nausea or vomiting). 08/06/18   Sindy Guadeloupe, MD  ondansetron (ZOFRAN) 8 MG tablet Take 1 tablet (8 mg total) by mouth 2 (two) times daily as needed for refractory nausea / vomiting. Start on day 3 after carboplatin chemo. 08/06/18   Sindy Guadeloupe, MD  oxyCODONE (OXY IR/ROXICODONE) 5 MG immediate release tablet Take 1 tablet (5 mg total) by mouth every 6 (six) hours as needed for severe pain. Patient not taking: Reported on 10/29/2018 07/08/18   Sindy Guadeloupe, MD  prochlorperazine (COMPAZINE) 10 MG tablet Take 1 tablet (10 mg total) by mouth every 6 (six) hours as needed (Nausea or vomiting). 08/06/18   Sindy Guadeloupe, MD     Allergies Bee venom   Family History  Problem  Relation Age of Onset  . Hypertension Mother   . Ovarian cancer Mother 81  . Heart disease Father   . Stroke Father   . Ovarian cancer Sister        ? dx cancer had hyst.  . Breast cancer Neg Hx     Social History Social History   Tobacco Use  . Smoking status: Former Smoker    Packs/day: 2.00    Years: 50.00    Pack years: 100.00    Types: Cigarettes, E-cigarettes    Quit date: 02/07/2012    Years since quitting: 6.7  . Smokeless tobacco: Never Used  Substance Use Topics  . Alcohol use: No    Alcohol/week: 0.0 standard drinks  . Drug use: No    Review of Systems  Constitutional:   No fever positive chills.  ENT:   No sore throat. No rhinorrhea. Cardiovascular:   No chest pain or syncope. Respiratory:   No  dyspnea or cough. Gastrointestinal:   Negative for abdominal pain, positive vomiting Musculoskeletal:   Negative for focal pain or swelling All other systems reviewed and are negative except as documented above in ROS and HPI.  ____________________________________________   PHYSICAL EXAM:  VITAL SIGNS: ED Triage Vitals  Enc Vitals Group     BP 10/29/18 1015 (!) 112/52     Pulse Rate 10/29/18 1015 (!) 126     Resp 10/29/18 1015 (!) 24     Temp 10/29/18 1015 100 F (37.8 C)     Temp Source 10/29/18 1015 Oral     SpO2 10/29/18 1015 96 %     Weight 10/29/18 1017 162 lb (73.5 kg)     Height 10/29/18 1017 5\' 5"  (1.651 m)     Head Circumference --      Peak Flow --      Pain Score 10/29/18 1016 0     Pain Loc --      Pain Edu? --      Excl. in [place]? --     Vital signs reviewed, nursing assessments reviewed.   Constitutional:   Alert and oriented.  Ill-appearing. Eyes:   Conjunctivae are pale. EOMI. PERRL. ENT      Head:   Normocephalic and atraumatic.      Nose:   Wearing a mask.      Mouth/Throat:   Dry mucous membranes.  Wearing a mask.      Neck:   No meningismus. Full ROM. Hematological/Lymphatic/Immunilogical:   No cervical  lymphadenopathy. Cardiovascular:   Tachycardia heart rate 120. Symmetric bilateral radial and DP pulses.  No murmurs. Cap refill less than 2 seconds. Respiratory:   Tachypnea, normal work of breathing.  No focal wheezing or crackles Gastrointestinal:   Soft and nontender. Non distended. There is no CVA tenderness.  No rebound, rigidity, or guarding.  Musculoskeletal:   Normal range of motion in all extremities. No joint effusions.  No lower extremity tenderness.  No edema. Neurologic:   Normal speech and language.  Motor grossly intact. No acute focal neurologic deficits are appreciated.  Skin:    Skin is warm, dry and intact. No rash noted.  No petechiae, purpura, or bullae.  ____________________________________________    LABS (pertinent positives/negatives) (all labs ordered are listed, but only abnormal results are displayed) Labs Reviewed  LACTIC ACID, PLASMA - Abnormal; Notable for the following components:      Result Value   Lactic Acid, Venous 3.0 (*)    All other components within normal limits  COMPREHENSIVE METABOLIC PANEL - Abnormal; Notable for the following components:   CO2 20 (*)    Calcium 7.8 (*)    Total Protein 6.4 (*)    Albumin 3.4 (*)    AST 60 (*)    ALT 47 (*)    Alkaline Phosphatase 158 (*)    Total Bilirubin 2.3 (*)    All other components within normal limits  CBC WITH DIFFERENTIAL/PLATELET - Abnormal; Notable for the following components:   WBC 0.8 (*)    RBC 2.16 (*)    Hemoglobin 6.2 (*)    HCT 19.6 (*)    RDW 22.4 (*)    Platelets 28 (*)    Neutro Abs 0.6 (*)    Lymphs Abs 0.0 (*)    Monocytes Absolute 0.0 (*)    Abs Immature Granulocytes 0.16 (*)    All other components within normal limits  PROTIME-INR - Abnormal; Notable for the following components:  Prothrombin Time 15.7 (*)    INR 1.3 (*)    All other components within normal limits  SARS CORONAVIRUS 2 (HOSPITAL ORDER, [place] LAB)  CULTURE, BLOOD  (ROUTINE X 2)  CULTURE, BLOOD (ROUTINE X 2)  URINE CULTURE  LIPASE, BLOOD  APTT  PROCALCITONIN  URINALYSIS, COMPLETE (UACMP) WITH MICROSCOPIC  LACTIC ACID, PLASMA  PATHOLOGIST SMEAR REVIEW  PREPARE RBC (CROSSMATCH)  TYPE AND SCREEN   ____________________________________________   EKG  Interpreted by me Sinus tachycardia rate 129, normal axis intervals QRS ST segments and T waves.  No evidence of right heart strain.  ____________________________________________    RADIOLOGY  No results found.  ____________________________________________   PROCEDURES .Critical Care Performed by: Carrie Mew, MD Authorized by: Carrie Mew, MD   Critical care provider statement:    Critical care time (minutes):  35   Critical care time was exclusive of:  Separately billable procedures and treating other patients   Critical care was necessary to treat or prevent imminent or life-threatening deterioration of the following conditions:  Circulatory failure, sepsis, shock and dehydration   Critical care was time spent personally by me on the following activities:  Development of treatment plan with patient or surrogate, discussions with consultants, evaluation of patient's response to treatment, examination of patient, obtaining history from patient or surrogate, ordering and performing treatments and interventions, ordering and review of laboratory studies, ordering and review of radiographic studies, pulse oximetry, re-evaluation of patient's condition and review of old charts    ____________________________________________  DIFFERENTIAL DIAGNOSIS   Symptomatic anemia, intra-abdominal infectious process, pneumonia, UTI, sepsis, COVID-19  CLINICAL IMPRESSION / ASSESSMENT AND PLAN / ED COURSE  Medications ordered in the ED: Medications  0.9 %  sodium chloride infusion (has no administration in time range)  ceFEPIme (MAXIPIME) 2 g in sodium chloride 0.9 % 100 mL IVPB (0 g  Intravenous Stopped 10/29/18 1206)  metroNIDAZOLE (FLAGYL) IVPB 500 mg (0 mg Intravenous Stopped 10/29/18 1216)  vancomycin (VANCOCIN) 1,500 mg in sodium chloride 0.9 % 500 mL IVPB (0 mg Intravenous Stopped 10/29/18 1424)  ondansetron (ZOFRAN) 4 MG/2ML injection (4 mg  Given 10/29/18 1055)  sodium chloride 0.9 % bolus 1,000 mL (0 mLs Intravenous Stopped 10/29/18 1206)    Pertinent labs & imaging results that were available during my care of the patient were reviewed by me and considered in my medical decision making (see chart for details).  Acelynn Dejonge Essner was evaluated in Emergency Department on 10/29/2018 for the symptoms described in the history of present illness. She was evaluated in the context of the global COVID-19 pandemic, which necessitated consideration that the patient might be at risk for infection with the SARS-CoV-2 virus that causes COVID-19. Institutional protocols and algorithms that pertain to the evaluation of patients at risk for COVID-19 are in a state of rapid change based on information released by regulatory bodies including the CDC and federal and state organizations. These policies and algorithms were followed during the patient's care in the ED.   Patient presents with low-grade fever, tachycardia, tachypnea.  Code sepsis initiated on arrival.  Will check labs, give empiric cefepime and vancomycin, IV fluid bolus for resuscitation.  COVID test.  Clinical Course as of Oct 28 1433  Wed Oct 29, 2018  1156 Labs revealed pancytopenia, elevated lactic acid.  Patient will need to be hospitalized for further management.  I will start a blood transfusion for hemodynamic support.   [PS]    Clinical Course User Index [  PS] Carrie Mew, MD     ----------------------------------------- 2:34 PM on 10/29/2018 -----------------------------------------  Tachycardia resolved after IVF.  D/w hospitalist for further management. covid  negative  ____________________________________________   FINAL CLINICAL IMPRESSION(S) / ED DIAGNOSES    Final diagnoses:  Sepsis, due to unspecified organism, unspecified whether acute organ dysfunction present ([place])  Pancytopenia ([place])  Symptomatic anemia     ED Discharge Orders    None      Portions of this note were generated with dragon dictation software. Dictation errors may occur despite best attempts at proofreading.   Carrie Mew, MD 10/29/18 1435

## 2018-10-29 NOTE — Progress Notes (Signed)
CODE SEPSIS - PHARMACY COMMUNICATION  **Broad Spectrum Antibiotics should be administered within 1 hour of Sepsis diagnosis**  Time Code Sepsis Called/Page Received: @ 3546  Antibiotics Ordered: Vancomycin, Cefepime, Flagyl   Time of 1st antibiotic administration: 1121  Additional action taken by pharmacy: Spoke with RN @ 1105 about time left to start antibiotics for Code Sepsis   If necessary, Name of Provider/Nurse Contacted: Mickel Baas, RN   Pernell Dupre, PharmD, BCPS Clinical Pharmacist 10/29/2018 11:10 AM

## 2018-10-29 NOTE — Consult Note (Signed)
PHARMACY -  BRIEF ANTIBIOTIC NOTE   Pharmacy has received consult(s) for Cefepime and Vancomycin from an ED provider.  The patient's profile has been reviewed for ht/wt/allergies/indication/available labs.    One time order(s) placed for Vancomycin 1500mg  and Cefepime 2g IV   Further antibiotics/pharmacy consults should be ordered by admitting physician if indicated.                       Thank you, Pernell Dupre, PharmD, BCPS Clinical Pharmacist 10/29/2018 10:36 AM

## 2018-10-29 NOTE — ED Notes (Signed)
Kim RN, aware of bed assigned

## 2018-10-29 NOTE — ED Notes (Signed)
Portacath is accessed upon arrival.

## 2018-10-29 NOTE — Consult Note (Signed)
Pharmacy Antibiotic Note  Anne [name] is a 68 y.o. female admitted on 10/29/2018 with sepsis.  Pharmacy has been consulted for Vancomycin and Cefepime dosing.  Plan: 1) Vancomycin 1500 mg IV Q 24 hrs. Goal AUC 400-550. Expected AUC: 532.1 Expected Css: 11.8 SCr used: 0.8(actual 0.71)  2) Cefepime 2g Q8 hours   Height: 5\' 5"  (165.1 cm) Weight: 162 lb (73.5 kg) IBW/kg (Calculated) : 57  Temp (24hrs), Avg:99.1 F (37.3 C), Min:98.5 F (36.9 C), Max:100 F (37.8 C)  Recent Labs  Lab 10/23/18 1323 10/28/18 1045 10/29/18 0931 10/29/18 1056 10/29/18 1354  WBC  --  10.4 0.7* 0.8*  --   CREATININE 0.62 0.73 0.81 0.71  --   LATICACIDVEN  --   --   --  3.0* 4.1*    Estimated Creatinine Clearance: 65.7 mL/min (by C-G formula based on SCr of 0.71 mg/dL).    Allergies  Allergen Reactions  . Bee Venom Swelling    Antimicrobials this admission: Vancomycin 9/23 >>  Cefepime 9/23 >>    Microbiology results: 9/23 BCx: pending 9/23 UCx: pending    Thank you for allowing pharmacy to be a part of this patient's care.  Lanessa Shill A Jerrion Tabbert 10/29/2018 5:04 PM

## 2018-10-29 NOTE — ED Triage Notes (Signed)
Pt in via POV, sent over from cancer center, states she was here for chemo and got sick.  Reports she was sent here due to emesis.  Pt denies being sick prior to today.  Denies any other complaints.

## 2018-10-29 NOTE — Progress Notes (Signed)
Family Meeting Note  Advance Directive:no  Today a meeting took place with the Patient.  Patient is uable to participate.  The following clinical team members were present during this meeting:MD  The following were discussed:Patient's diagnosis: sepsis, Patient's progosis: Unable to determine and Goals for treatment: Full Code  Additional follow-up to be provided: prn  Time spent during discussion:20 minutes  Evette Doffing, MD

## 2018-10-29 NOTE — Progress Notes (Signed)
10:05 a.m.: Patient here at the [facility] for a unit of blood this a.m., feeling sick on arrival, shaking, cold, vomiting, said she woke up not feeling well.  Patient taken to the ER per Dr. Norberta Keens, NP, Sonia Baller to call report.  Patient handed off to ER staff.  Patient's port still accessed on arrival to the ER.

## 2018-10-29 NOTE — ED Notes (Signed)
Per Lab type and screen delayed due to coagulation. Type and Screen recollected by this RN,.

## 2018-10-29 NOTE — Progress Notes (Signed)
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  Chief complaint/ Reason for visit-vomiting/weakness  Heme/Onc history:  Oncology History  Small cell lung cancer, right upper lobe ([place])  08/05/2018 Cancer Staging   Staging form: Lung, AJCC 8th Edition - Clinical stage from 08/05/2018: Stage IVB (cT3, cN2, cM1c) - Signed by Sindy Guadeloupe, MD on 08/06/2018   08/06/2018 Initial Diagnosis   Small cell lung cancer, right upper lobe ([place])   08/18/2018 -  Chemotherapy   The patient had palonosetron (ALOXI) injection 0.25 mg, 0.25 mg, Intravenous,  Once, 4 of 4 cycles Administration: 0.25 mg (08/18/2018), 0.25 mg (09/09/2018), 0.25 mg (09/30/2018), 0.25 mg (10/21/2018) pegfilgrastim (NEULASTA ONPRO KIT) injection 6 mg, 6 mg, Subcutaneous, Once, 4 of 4 cycles Administration: 6 mg (08/20/2018), 6 mg (09/11/2018), 6 mg (10/02/2018), 6 mg (10/23/2018) CARBOplatin (PARAPLATIN) 460 mg in sodium chloride 0.9 % 250 mL chemo infusion, 460 mg (100 % of original dose 462.5 mg), Intravenous,  Once, 4 of 4 cycles Dose modification:   (original dose 462.5 mg, Cycle 1) Administration: 460 mg (08/18/2018), 460 mg (09/09/2018), 460 mg (09/30/2018), 460 mg (10/21/2018) etoposide (VEPESID) 190 mg in sodium chloride 0.9 % 500 mL chemo infusion, 100 mg/m2 = 190 mg, Intravenous,  Once, 4 of 4 cycles Administration: 190 mg (08/18/2018), 190 mg (08/19/2018), 190 mg (08/20/2018), 190 mg (09/09/2018), 190 mg (09/10/2018), 190 mg (09/11/2018), 190 mg (09/30/2018), 190 mg (10/01/2018), 190 mg (10/02/2018), 190 mg  (10/21/2018), 190 mg (10/22/2018), 190 mg (10/23/2018) fosaprepitant (EMEND) 150 mg, dexamethasone (DECADRON) 12 mg in sodium chloride 0.9 % 145 mL IVPB, , Intravenous,  Once, 4 of 4 cycles Administration:  (08/18/2018),  (09/09/2018),  (09/30/2018),  (10/21/2018) atezolizumab (TECENTRIQ) 1,200 mg in sodium chloride 0.9 % 250 mL chemo infusion, 1,200 mg, Intravenous, Once, 4 of 8 cycles Administration: 1,200 mg (08/18/2018), 1,200 mg (09/09/2018), 1,200 mg (09/30/2018), 1,200 mg (10/21/2018)  for chemotherapy treatment.      Interval history-patient evaluated in infusion prior to blood transfusion.  Infusion RN stated as soon as they brought her back to infusion she began profusely vomiting and had several bowel/bladder incontinent episodes.  She appeared really weak and pale.  Infusion RN called NP for evaluation.  She is currently being treated for small cell lung cancer with brain metastasis.  She is status post 4 cycles of carbo etoposide and Tecentriq chemotherapy.  She has asymptomatic hypocalcemia and she is receiving IV calcium weekly.  She received 4 g of IV calcium yesterday.  She is receiving Neulasta with treatments.  Requiring blood transfusions for chemotherapy-induced anemia.  Scheduled for 1 unit today.  Today, patient states she woke up feeling poor.  She had severe abdominal pain and was extremely nauseated but did not vomit.  She was able to make it to our clinic but as soon as she was wheeled back to infusion she began to have non- intractable vomiting.  She did not have anything to eat or drink this morning.  She was able to consume dinner last night.  She is afebrile.  She continues to feel weak and  fatigued.  She denies any chest pain or shortness of breath.  She just feels "bad".  ECOG FS:2 - Symptomatic, <50% confined to bed  Review of systems- Review of Systems  Constitutional: Positive for diaphoresis and malaise/fatigue.  Gastrointestinal: Positive for abdominal pain, diarrhea,  nausea and vomiting.  Neurological: Positive for dizziness, weakness and headaches.     Current treatment- s/p cycle 4 of carbo etoposide and Tecentriq.  Last given on 10/23/2018.  Allergies  Allergen Reactions  . Bee Venom Swelling     Past Medical History:  Diagnosis Date  . Arthritis   . Cirrhosis ([place])   . Depression   . GERD (gastroesophageal reflux disease)   . Hyperlipidemia   . Hypertension   . Lung cancer ([place])   . Metastatic bone cancer [facility])      Past Surgical History:  Procedure Laterality Date  . APPENDECTOMY  1971  . CHOLECYSTECTOMY  1971  . COLONOSCOPY WITH PROPOFOL N/A 11/15/2016   Procedure: COLONOSCOPY WITH PROPOFOL;  Surgeon: Jonathon Bellows, MD;  Location: [facility] ENDOSCOPY;  Service: Gastroenterology;  Laterality: N/A;  . ENDOBRONCHIAL ULTRASOUND Right 07/30/2018   Procedure: ENDOBRONCHIAL ULTRASOUND;  Surgeon: Laverle Hobby, MD;  Location: [facility] ORS;  Service: Pulmonary;  Laterality: Right;  . ESOPHAGOGASTRODUODENOSCOPY (EGD) WITH PROPOFOL N/A 01/07/2017   Procedure: ESOPHAGOGASTRODUODENOSCOPY (EGD) WITH PROPOFOL;  Surgeon: Jonathon Bellows, MD;  Location: [facility] ENDOSCOPY;  Service: Gastroenterology;  Laterality: N/A;  . LAPAROSCOPY N/A 03/01/2017   Procedure: LAPAROSCOPY DIAGNOSTIC;  Surgeon: Robert Bellow, MD;  Location: [facility] ORS;  Service: General;  Laterality: N/A;  . PORTA CATH INSERTION N/A 08/14/2018   Procedure: PORTA CATH INSERTION;  Surgeon: Algernon Huxley, MD;  Location: [place] CV LAB;  Service: Cardiovascular;  Laterality: N/A;  . TONSILECTOMY, ADENOIDECTOMY, BILATERAL MYRINGOTOMY [place]  . TONSILLECTOMY    . VENTRAL HERNIA REPAIR N/A 03/01/2017   10 x 14 CM Ventralight ST mesh, intraperitoneal location.   . VENTRAL HERNIA REPAIR N/A 03/01/2017   Procedure: HERNIA REPAIR VENTRAL ADULT;  Surgeon: Robert Bellow, MD;  Location: [facility] ORS;  Service: General;  Laterality: N/A;    Social History   Socioeconomic History  . Marital  status: Married    Spouse name: [name]  . Number of children: Not on file  . Years of education: Not on file  . Highest education level: Not on file  Occupational History  . Not on file  Social Needs  . Financial resource strain: Not hard at all  . Food insecurity    Worry: Never true    Inability: Never true  . Transportation needs    Medical: No    Non-medical: No  Tobacco Use  . Smoking status: Former Smoker    Packs/day: 2.00    Years: 50.00    Pack years: 100.00    Types: Cigarettes, E-cigarettes    Quit date: 02/07/2012    Years since quitting: 6.7  . Smokeless tobacco: Never Used  Substance and Sexual Activity  . Alcohol use: No    Alcohol/week: 0.0 standard drinks  . Drug use: No  . Sexual activity: Yes  Lifestyle  . Physical activity    Days per week: Patient refused    Minutes per session: Patient refused  . Stress: Not at all  Relationships  . Social connections    Talks on phone: Patient refused    Gets together: Patient refused    Attends religious service: Patient refused    Active member of club  or organization: Patient refused    Attends meetings of clubs or organizations: Patient refused    Relationship status: Patient refused  . Intimate partner violence    Fear of current or ex partner: No    Emotionally abused: No    Physically abused: No    Forced sexual activity: No  Other Topics Concern  . Not on file  Social History Narrative   Married   Retired   Clinical cytogeneticist level of education    No children    1 cup of coffee    Family History  Problem Relation Age of Onset  . Hypertension Mother   . Ovarian cancer Mother 45  . Heart disease Father   . Stroke Father   . Ovarian cancer Sister        ? dx cancer had hyst.  . Breast cancer Neg Hx     No current facility-administered medications for this visit.  No current outpatient medications on file.  Facility-Administered Medications Ordered in Other Visits:  .  0.9 %  sodium  chloride infusion, , Intravenous, Continuous, Mayo, Pete Pelt, MD, Last Rate: 75 mL/hr at 10/29/18 2211 .  acetaminophen (TYLENOL) tablet 650 mg, 650 mg, Oral, Q6H PRN **OR** acetaminophen (TYLENOL) suppository 650 mg, 650 mg, Rectal, Q6H PRN, Mayo, Pete Pelt, MD .  buPROPion (WELLBUTRIN XL) 24 hr tablet 300 mg, 300 mg, Oral, Daily, Mayo, Pete Pelt, MD, 300 mg at 10/30/18 0916 .  cefTRIAXone (ROCEPHIN) 2 g in sodium chloride 0.9 % 100 mL IVPB, 2 g, Intravenous, Q24H, [place], Bing Neighbors, NP, Last Rate: 200 mL/hr at 10/30/18 0453, 2 g at 10/30/18 0453 .  Chlorhexidine Gluconate Cloth 2 % PADS 6 each, 6 each, Topical, Daily, Demetrios Loll, MD, 6 each at 10/30/18 1525 .  feeding supplement (ENSURE ENLIVE) (ENSURE ENLIVE) liquid 237 mL, 237 mL, Oral, BID BM, Demetrios Loll, MD .  folic acid (FOLVITE) tablet 1 mg, 1 mg, Oral, Daily, Mayo, Pete Pelt, MD, 1 mg at 10/30/18 0916 .  loperamide (IMODIUM) capsule 2 mg, 2 mg, Oral, Q2H PRN, Mayo, Pete Pelt, MD .  LORazepam (ATIVAN) tablet 0.5 mg, 0.5 mg, Oral, Q6H PRN, Mayo, Pete Pelt, MD .  magnesium sulfate IVPB 4 g 100 mL, 4 g, Intravenous, Once, Demetrios Loll, MD .  Derrill Memo ON 10/31/2018] multivitamin with minerals tablet 1 tablet, 1 tablet, Oral, Daily, Demetrios Loll, MD .  multivitamin-lutein (OCUVITE-LUTEIN) capsule 1 capsule, 1 capsule, Oral, Daily, Mayo, Pete Pelt, MD, 1 capsule at 10/30/18 [phone] .  ondansetron (ZOFRAN) tablet 4 mg, 4 mg, Oral, Q6H PRN, 4 mg at 10/30/18 1252 **OR** ondansetron (ZOFRAN) injection 4 mg, 4 mg, Intravenous, Q6H PRN, Mayo, Pete Pelt, MD .  oxyCODONE (Oxy IR/ROXICODONE) immediate release tablet 5 mg, 5 mg, Oral, Q4H PRN, Mayo, Pete Pelt, MD .  pantoprazole (PROTONIX) EC tablet 40 mg, 40 mg, Oral, BID AC, Mayo, Pete Pelt, MD, 40 mg at 10/30/18 0916 .  polyethylene glycol (MIRALAX / GLYCOLAX) packet 17 g, 17 g, Oral, Daily PRN, Mayo, Pete Pelt, MD .  rosuvastatin (CRESTOR) tablet 20 mg, 20 mg, Oral, Daily, Mayo, Pete Pelt, MD, 20 mg at  10/30/18 0916 .  vancomycin (VANCOCIN) 1,500 mg in sodium chloride 0.9 % 500 mL IVPB, 1,500 mg, Intravenous, Q24H, Demetrios Loll, MD  Physical exam: There were no vitals filed for this visit. Physical Exam Constitutional:      General: She is in acute distress.     Appearance: She is ill-appearing.  Skin:  Coloration: Skin is pale.  Neurological:     Mental Status: She is lethargic.     Motor: Weakness present.      CMP Latest Ref Rng & Units 10/30/2018  Glucose 70 - 99 mg/dL 92  BUN 8 - 23 mg/dL 15  Creatinine 0.44 - 1.00 mg/dL 0.69  Sodium 135 - 145 mmol/L 136  Potassium 3.5 - 5.1 mmol/L 3.4(L)  Chloride 98 - 111 mmol/L 103  CO2 22 - 32 mmol/L 20(L)  Calcium 8.9 - 10.3 mg/dL 6.6(L)  Total Protein 6.5 - 8.1 g/dL 5.4(L)  Total Bilirubin 0.3 - 1.2 mg/dL 1.0  Alkaline Phos 38 - 126 U/L 120  AST 15 - 41 U/L 25  ALT 0 - 44 U/L 31   CBC Latest Ref Rng & Units 10/30/2018  WBC 4.0 - 10.5 K/uL 0.4(LL)  Hemoglobin 12.0 - 15.0 g/dL 5.1(L)  Hematocrit 36.0 - 46.0 % 15.6(L)  Platelets 150 - 400 K/uL 20(LL)    No images are attached to the encounter.  Ct Head Wo Contrast  Result Date: 10/15/2018 CLINICAL DATA:  Altered level of consciousness EXAM: CT HEAD WITHOUT CONTRAST TECHNIQUE: Contiguous axial images were obtained from the base of the skull through the vertex without intravenous contrast. COMPARISON:  None. FINDINGS: Brain: No evidence of acute infarction, hemorrhage, extra-axial collection, ventriculomegaly, or mass effect. Generalized cerebral atrophy. Periventricular white matter low attenuation likely secondary to microangiopathy. Vascular: Cerebrovascular atherosclerotic calcifications are noted. Skull: Negative for fracture or focal lesion. Sinuses/Orbits: Visualized portions of the orbits are unremarkable. Visualized portions of the paranasal sinuses and mastoid air cells are unremarkable. Other: None. IMPRESSION: No acute intracranial pathology. Electronically Signed   By:  Kathreen Devoid   On: 10/15/2018 11:28   US Abdomen Limited Ruq  Result Date: 10/30/2018 CLINICAL DATA:  Elevated LFTs EXAM: ULTRASOUND ABDOMEN LIMITED RIGHT UPPER QUADRANT COMPARISON:  CT abdomen and pelvis 09/22/2018 FINDINGS: Gallbladder: Surgically absent Common bile duct: Diameter: 4 mm diameter, normal Liver: Mildly heterogeneous upper normal echogenicity of liver with nodular contours consistent with cirrhosis. No discrete hepatic mass lesion identified, though assessment of intrahepatic detail is slightly limited due to sound attenuation. Portal vein is patent on color Doppler imaging with normal direction of blood flow towards the liver. Other: No RIGHT upper quadrant free fluid. IMPRESSION: Post cholecystectomy. Cirrhotic appearing liver without definite mass. Electronically Signed   By: Lavonia Dana M.D.   On: 10/30/2018 09:57     Assessment and plan- Patient is a 68 y.o. female who is evaluated in infusion for non intractable nausea and vomiting, weakness and tachycardia.  Stage IV lung cancer with bony metastasis: s/p 4 cycles of carbo etoposide and Tecentriq.  Tolerating fairly well except for chemotherapy-induced anemia and asymptomatic hypocalcemia.  Plan is for repeat CT chest abdomen in the next couple weeks.  She was scheduled to return to clinic on Friday for imaging and on Tuesday for MD assessment and lab work.  Tachycardia: Likely due to anemia and dehydration but cannot rule out infection.  Unfortunately, she continues to vomit uncontrollably even after receiving antiemetics and steroids.  Acuity too high for infusion.  Patient will likely need emergency room evaluation.  Nausea/vomiting: Ordered IV Zofran and dexamethasone.   Plan: Vital signs: Tachycardia. Give 10 mg Decadron. Give 8 mg Zofran. EKG Labs.  Elevated bili, hemoglobin 6.6, ANC 0.4, elevated liver enzymes  Disposition: Patient taken directly to the emergency room for further work-up and evaluation.   Visit  Diagnosis 1. Non-intractable vomiting  with nausea, unspecified vomiting type   2. Small cell lung cancer, right upper lobe ([place])   3. Anemia due to antineoplastic chemotherapy     Patient expressed understanding and was in agreement with this plan. She also understands that She can call clinic at any time with any questions, concerns, or complaints.   Greater than 50% was spent in counseling and coordination of care with this patient including but not limited to discussion of the relevant topics above (See A&P) including, but not limited to diagnosis and management of acute and chronic medical conditions.   Thank you for allowing me to participate in the care of this very pleasant patient.    Jacquelin Hawking, NP [place] [facility] Cell - [phone] Pager- [phone] 10/30/2018 3:47 PM

## 2018-10-30 ENCOUNTER — Inpatient Hospital Stay: Payer: PPO

## 2018-10-30 ENCOUNTER — Encounter: Payer: Self-pay | Admitting: Obstetrics and Gynecology

## 2018-10-30 DIAGNOSIS — R748 Abnormal levels of other serum enzymes: Secondary | ICD-10-CM

## 2018-10-30 DIAGNOSIS — Z87891 Personal history of nicotine dependence: Secondary | ICD-10-CM

## 2018-10-30 DIAGNOSIS — D61818 Other pancytopenia: Secondary | ICD-10-CM

## 2018-10-30 DIAGNOSIS — N764 Abscess of vulva: Secondary | ICD-10-CM

## 2018-10-30 DIAGNOSIS — D649 Anemia, unspecified: Secondary | ICD-10-CM

## 2018-10-30 DIAGNOSIS — C3490 Malignant neoplasm of unspecified part of unspecified bronchus or lung: Secondary | ICD-10-CM

## 2018-10-30 DIAGNOSIS — C7951 Secondary malignant neoplasm of bone: Secondary | ICD-10-CM

## 2018-10-30 DIAGNOSIS — A419 Sepsis, unspecified organism: Secondary | ICD-10-CM

## 2018-10-30 DIAGNOSIS — B955 Unspecified streptococcus as the cause of diseases classified elsewhere: Secondary | ICD-10-CM

## 2018-10-30 DIAGNOSIS — C3411 Malignant neoplasm of upper lobe, right bronchus or lung: Secondary | ICD-10-CM

## 2018-10-30 DIAGNOSIS — K0889 Other specified disorders of teeth and supporting structures: Secondary | ICD-10-CM

## 2018-10-30 DIAGNOSIS — I10 Essential (primary) hypertension: Secondary | ICD-10-CM

## 2018-10-30 DIAGNOSIS — E785 Hyperlipidemia, unspecified: Secondary | ICD-10-CM

## 2018-10-30 DIAGNOSIS — R7881 Bacteremia: Secondary | ICD-10-CM

## 2018-10-30 DIAGNOSIS — Z91030 Bee allergy status: Secondary | ICD-10-CM

## 2018-10-30 DIAGNOSIS — K7460 Unspecified cirrhosis of liver: Secondary | ICD-10-CM

## 2018-10-30 LAB — RESPIRATORY PANEL BY PCR

## 2018-10-30 LAB — BLOOD CULTURE ID PANEL (REFLEXED)

## 2018-10-30 LAB — PREPARE RBC (CROSSMATCH)

## 2018-10-30 LAB — COMPREHENSIVE METABOLIC PANEL
ALT: 31 U/L (ref 0–44)
AST: 25 U/L (ref 15–41)
Albumin: 2.9 g/dL — ABNORMAL LOW (ref 3.5–5.0)
Alkaline Phosphatase: 120 U/L (ref 38–126)
Anion gap: 13 (ref 5–15)
BUN: 15 mg/dL (ref 8–23)
CO2: 20 mmol/L — ABNORMAL LOW (ref 22–32)
Calcium: 6.6 mg/dL — ABNORMAL LOW (ref 8.9–10.3)
Chloride: 103 mmol/L (ref 98–111)
Creatinine, Ser: 0.69 mg/dL (ref 0.44–1.00)
GFR calc Af Amer: >60 mL/min (ref >60)
GFR calc non Af Amer: >60 mL/min (ref >60)
Glucose, Bld: 92 mg/dL (ref 70–99)
Potassium: 3.4 mmol/L — ABNORMAL LOW (ref 3.5–5.1)
Sodium: 136 mmol/L (ref 135–145)
Total Bilirubin: 1 mg/dL (ref 0.3–1.2)
Total Protein: 5.4 g/dL — ABNORMAL LOW (ref 6.5–8.1)

## 2018-10-30 LAB — CBC
HCT: 15.6 % — ABNORMAL LOW (ref 36.0–46.0)
Hemoglobin: 5.1 g/dL — ABNORMAL LOW (ref 12.0–15.0)
MCH: 28.8 pg (ref 26.0–34.0)
MCHC: 32.7 g/dL (ref 30.0–36.0)
MCV: 88.1 fL (ref 80.0–100.0)
Platelets: 20 10*3/uL — CL (ref 150–400)
RBC: 1.77 MIL/uL — ABNORMAL LOW (ref 3.87–5.11)
RDW: 20.8 % — ABNORMAL HIGH (ref 11.5–15.5)
WBC: 0.4 10*3/uL — CL (ref 4.0–10.5)
nRBC: 0 % (ref 0.0–0.2)

## 2018-10-30 LAB — MAGNESIUM: Magnesium: 1.3 mg/dL — ABNORMAL LOW (ref 1.7–2.4)

## 2018-10-30 LAB — URINE CULTURE: Culture: NO GROWTH

## 2018-10-30 MED ORDER — ENSURE ENLIVE PO LIQD
237.0000 mL | Freq: Two times a day (BID) | ORAL | Status: DC
  Administered 2018-10-31 – 2018-11-03 (×6): 237 mL via ORAL

## 2018-10-30 MED ORDER — POTASSIUM CHLORIDE CRYS ER 20 MEQ PO TBCR
40.0000 meq | EXTENDED_RELEASE_TABLET | Freq: Once | ORAL | Status: AC
  Administered 2018-10-30: 40 meq via ORAL
  Filled 2018-10-30: qty 2

## 2018-10-30 MED ORDER — VANCOMYCIN HCL 1.5 G IV SOLR
1500.0000 mg | INTRAVENOUS | Status: DC
  Administered 2018-10-30: 17:00:00 1500 mg via INTRAVENOUS
  Filled 2018-10-30: qty 1500

## 2018-10-30 MED ORDER — SODIUM CHLORIDE 0.9% IV SOLUTION
Freq: Once | INTRAVENOUS | Status: AC
  Administered 2018-10-30: 15:00:00 via INTRAVENOUS

## 2018-10-30 MED ORDER — SODIUM CHLORIDE 0.9 % IV SOLN
2.0000 g | INTRAVENOUS | Status: DC
  Administered 2018-10-30 – 2018-11-03 (×5): 2 g via INTRAVENOUS
  Filled 2018-10-30 (×2): qty 2
  Filled 2018-10-30: qty 20
  Filled 2018-10-30 (×2): qty 2

## 2018-10-30 MED ORDER — MAGNESIUM SULFATE 4 GM/100ML IV SOLN
4.0000 g | Freq: Once | INTRAVENOUS | Status: AC
  Administered 2018-10-30: 4 g via INTRAVENOUS
  Filled 2018-10-30: qty 100

## 2018-10-30 MED ORDER — SILVER SULFADIAZINE 1 % EX CREA
TOPICAL_CREAM | Freq: Two times a day (BID) | CUTANEOUS | Status: DC
  Administered 2018-10-30 – 2018-11-01 (×5): via TOPICAL
  Administered 2018-11-02: 1 via TOPICAL
  Administered 2018-11-02 – 2018-11-03 (×2): via TOPICAL
  Filled 2018-10-30: qty 85
  Filled 2018-10-30: qty 20

## 2018-10-30 MED ORDER — ADULT MULTIVITAMIN W/MINERALS CH
1.0000 | ORAL_TABLET | Freq: Every day | ORAL | Status: DC
  Administered 2018-10-31 – 2018-11-03 (×4): 1 via ORAL
  Filled 2018-10-30 (×4): qty 1

## 2018-10-30 MED ORDER — ACETAMINOPHEN 325 MG PO TABS
650.0000 mg | ORAL_TABLET | Freq: Once | ORAL | Status: AC
  Administered 2018-10-30: 12:00:00 650 mg via ORAL
  Filled 2018-10-30: qty 2

## 2018-10-30 NOTE — Consult Note (Signed)
Reason for Consult:Labial abscess Referring Physian: Anne [name] is an 68 y.o. female.  HPI: She reports that for the last week she has had a bump on her bottom. It was painful and made sitting uncomfortable. She says that since she started antibiotics it has been feeling better. She denis vaginal discharge.  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  She denies any postmenopausal vaginal bleeding She reports she never had abnormal pap smear in the past.  She denies a history of vulvar or vaginal lesions or wart.   Obstetrical History She reports a history of 2 vaginal deliveries 10 years apart. She can not recall the years.  The EMR indicates she also had a miscarriage, but she did not report this to me.    Past Medical History:  Diagnosis Date  . Arthritis   . Cirrhosis ([place])   . Depression   . GERD (gastroesophageal reflux disease)   . Hyperlipidemia   . Hypertension   . Lung cancer ([place])   . Metastatic bone cancer [facility])     Past Surgical History:  Procedure Laterality Date  . APPENDECTOMY  1971  . CHOLECYSTECTOMY  1971  . COLONOSCOPY WITH PROPOFOL N/A 11/15/2016   Procedure: COLONOSCOPY WITH PROPOFOL;  Surgeon: Jonathon Bellows, MD;  Location: [facility] ENDOSCOPY;  Service: Gastroenterology;  Laterality: N/A;  . ENDOBRONCHIAL ULTRASOUND Right 07/30/2018   Procedure: ENDOBRONCHIAL ULTRASOUND;  Surgeon: Laverle Hobby, MD;  Location: [facility] ORS;  Service: Pulmonary;  Laterality: Right;  . ESOPHAGOGASTRODUODENOSCOPY (EGD) WITH PROPOFOL N/A 01/07/2017   Procedure: ESOPHAGOGASTRODUODENOSCOPY (EGD) WITH PROPOFOL;  Surgeon: Jonathon Bellows, MD;  Location: [facility] ENDOSCOPY;  Service: Gastroenterology;  Laterality: N/A;  . LAPAROSCOPY N/A 03/01/2017   Procedure: LAPAROSCOPY DIAGNOSTIC;  Surgeon: Robert Bellow, MD;  Location: [facility] ORS;  Service: General;  Laterality:  N/A;  . PORTA CATH INSERTION N/A 08/14/2018   Procedure: PORTA CATH INSERTION;  Surgeon: Algernon Huxley, MD;  Location: [facility] LAB;  Service: Cardiovascular;  Laterality: N/A;  . TONSILECTOMY, ADENOIDECTOMY, BILATERAL MYRINGOTOMY [place]  . TONSILLECTOMY    . VENTRAL HERNIA REPAIR N/A 03/01/2017   10 x 14 CM Ventralight ST mesh, intraperitoneal location.   . VENTRAL HERNIA REPAIR N/A 03/01/2017   Procedure: HERNIA REPAIR VENTRAL ADULT;  Surgeon: Robert Bellow, MD;  Location: [facility] ORS;  Service: General;  Laterality: N/A;    Family History  Problem Relation Age of Onset  . Hypertension Mother   . Ovarian cancer Mother 17  . Heart disease Father   . Stroke Father   . Ovarian cancer Sister        ? dx cancer had hyst.  . Breast cancer Neg Hx     Social History:  reports that she quit smoking about 6 years ago. Her smoking use included cigarettes and e-cigarettes. She has a 100.00 pack-year smoking history. She has never used smokeless tobacco. She reports that she does not drink alcohol or use drugs.  Allergies:  Allergies  Allergen Reactions  . Bee Venom Swelling    Medications: I have reviewed the patient's current medications.  Results for orders placed or performed during the hospital encounter of 10/29/18 (from the past 48 hour(s))  Respiratory Panel by PCR     Status: None   Collection Time: 10/29/18  6:43 AM   Specimen: Nasopharyngeal Swab; Respiratory  Result Value Ref Range  Adenovirus NOT DETECTED NOT DETECTED   Coronavirus 229E NOT DETECTED NOT DETECTED    Comment: (NOTE) The Coronavirus on the Respiratory Panel, DOES NOT test for the novel  Coronavirus (2019 nCoV)    Coronavirus HKU1 NOT DETECTED NOT DETECTED   Coronavirus NL63 NOT DETECTED NOT DETECTED   Coronavirus OC43 NOT DETECTED NOT DETECTED   Metapneumovirus NOT DETECTED NOT DETECTED   Rhinovirus / Enterovirus NOT DETECTED NOT DETECTED   Influenza A NOT DETECTED NOT DETECTED    Influenza B NOT DETECTED NOT DETECTED   Parainfluenza Virus 1 NOT DETECTED NOT DETECTED   Parainfluenza Virus 2 NOT DETECTED NOT DETECTED   Parainfluenza Virus 3 NOT DETECTED NOT DETECTED   Parainfluenza Virus 4 NOT DETECTED NOT DETECTED   Respiratory Syncytial Virus NOT DETECTED NOT DETECTED   Bordetella pertussis NOT DETECTED NOT DETECTED   Chlamydophila pneumoniae NOT DETECTED NOT DETECTED   Mycoplasma pneumoniae NOT DETECTED NOT DETECTED    Comment: Performed at [facility], [place] [address], [place], [place] [address]  Lipase, blood     Status: None   Collection Time: 10/29/18 10:56 AM  Result Value Ref Range   Lipase 29 11 - 51 U/L    Comment: Performed at [facility] [address] [place], [place] [address]  Lactic acid, plasma     Status: Abnormal   Collection Time: 10/29/18 10:56 AM  Result Value Ref Range   Lactic Acid, Venous 3.0 (HH) 0.5 - 1.9 mmol/L    Comment: CRITICAL RESULT CALLED TO, READ BACK BY AND VERIFIED WITH KIM MAIN AT 1133 ON 10/29/2018 JJB Performed at [facility], [address] [place], [place] [address]   Comprehensive metabolic panel     Status: Abnormal   Collection Time: 10/29/18 10:56 AM  Result Value Ref Range   Sodium 138 135 - 145 mmol/L   Potassium 3.5 3.5 - 5.1 mmol/L   Chloride 109 98 - 111 mmol/L   CO2 20 (L) 22 - 32 mmol/L   Glucose, Bld 94 70 - 99 mg/dL   BUN 18 8 - 23 mg/dL   Creatinine, Ser 0.71 0.44 - 1.00 mg/dL   Calcium 7.8 (L) 8.9 - 10.3 mg/dL   Total Protein 6.4 (L) 6.5 - 8.1 g/dL   Albumin 3.4 (L) 3.5 - 5.0 g/dL   AST 60 (H) 15 - 41 U/L   ALT 47 (H) 0 - 44 U/L   Alkaline Phosphatase 158 (H) 38 - 126 U/L   Total Bilirubin 2.3 (H) 0.3 - 1.2 mg/dL   GFR calc non Af Amer >60 >60 mL/min   GFR calc Af Amer >60 >60 mL/min   Anion gap 9 5 - 15    Comment: Performed at [facility], [address] [place],  [address]  CBC WITH DIFFERENTIAL     Status: Abnormal   Collection Time:  10/29/18 10:56 AM  Result Value Ref Range   WBC 0.8 (LL) 4.0 - 10.5 K/uL    Comment: CRITICAL VALUE NOTED.  VALUE IS CONSISTENT WITH PREVIOUSLY REPORTED AND CALLED VALUE. THIS CRITICAL RESULT HAS VERIFIED AND BEEN CALLED TO LAURA CATES BY HINA PATEL ON 09 23 2020 AT 1146, AND HAS BEEN READ BACK.     RBC 2.16 (L) 3.87 - 5.11 MIL/uL   Hemoglobin 6.2 (L) 12.0 - 15.0 g/dL   HCT 19.6 (L) 36.0 - 46.0 %   MCV 90.7 80.0 - 100.0 fL   MCH 28.7 26.0 - 34.0 pg  MCHC 31.6 30.0 - 36.0 g/dL   RDW 22.4 (H) 11.5 - 15.5 %   Platelets 28 (LL) 150 - 400 K/uL    Comment: PLATELET COUNT CONFIRMED BY SMEAR Immature Platelet Fraction may be clinically indicated, consider ordering this additional test [id number] THIS CRITICAL RESULT HAS VERIFIED AND BEEN CALLED TO LAURA CATES BY HINA PATEL ON 09 23 2020 AT 1146, AND HAS BEEN READ BACK.     nRBC 0.0 0.0 - 0.2 %   Neutrophils Relative % 71 %   Neutro Abs 0.6 (L) 1.7 - 7.7 K/uL   Lymphocytes Relative 4 %   Lymphs Abs 0.0 (L) 0.7 - 4.0 K/uL   Monocytes Relative 1 %   Monocytes Absolute 0.0 (L) 0.1 - 1.0 K/uL   Eosinophils Relative 0 %   Eosinophils Absolute 0.0 0.0 - 0.5 K/uL   Basophils Relative 3 %   Basophils Absolute 0.0 0.0 - 0.1 K/uL   Smear Review PLATELETS APPEAR DECREASED    Immature Granulocytes 21 %   Abs Immature Granulocytes 0.16 (H) 0.00 - 0.07 K/uL   Rouleaux PRESENT     Comment: Performed at [facility], [address] [place], [place] [address]  APTT     Status: None   Collection Time: 10/29/18 10:56 AM  Result Value Ref Range   aPTT 29 24 - 36 seconds    Comment: Performed at [facility], [address] [place], [place] [address]  Protime-INR     Status: Abnormal   Collection Time: 10/29/18 10:56 AM  Result Value Ref Range   Prothrombin Time 15.7 (H) 11.4 - 15.2 seconds   INR 1.3 (H) 0.8 - 1.2    Comment: (NOTE) INR goal varies based on device and disease states. Performed at [facility], [address] [place], [place] [address]   Blood Culture (routine x 2)     Status: None (Preliminary result)   Collection Time: 10/29/18 10:56 AM   Specimen: BLOOD  Result Value Ref Range   Specimen Description BLOOD BLOOD LEFT FOREARM    Special Requests      BOTTLES DRAWN AEROBIC AND ANAEROBIC Blood Culture adequate volume   Culture  Setup Time      Organism ID to follow GRAM POSITIVE COCCI IN BOTH AEROBIC AND ANAEROBIC BOTTLES CRITICAL RESULT CALLED TO, READ BACK BY AND VERIFIED WITH: [place] ON 10/30/2018 SNG Performed at [facility], [address], [place], [place] [address]    Culture GRAM POSITIVE COCCI    Report Status PENDING   Procalcitonin     Status: None   Collection Time: 10/29/18 10:56 AM  Result Value Ref Range   Procalcitonin 23.07 ng/mL    Comment:        Interpretation: PCT >= 10 ng/mL: Important systemic inflammatory response, almost exclusively due to severe bacterial sepsis or septic shock. (NOTE)       Sepsis PCT Algorithm           Lower Respiratory Tract                                      Infection PCT Algorithm    ----------------------------     ----------------------------         PCT < 0.25 ng/mL                PCT < 0.10 ng/mL  Strongly encourage             Strongly discourage   discontinuation of antibiotics    initiation of antibiotics    ----------------------------     -----------------------------       PCT 0.25 - 0.50 ng/mL            PCT 0.10 - 0.25 ng/mL               OR       >80% decrease in PCT            Discourage initiation of                                            antibiotics      Encourage discontinuation           of antibiotics    ----------------------------     -----------------------------         PCT >= 0.50 ng/mL              PCT 0.26 - 0.50 ng/mL                AND       <80% decrease in PCT             Encourage initiation of                                              antibiotics       Encourage continuation           of antibiotics    ----------------------------     -----------------------------        PCT >= 0.50 ng/mL                  PCT > 0.50 ng/mL               AND         increase in PCT                  Strongly encourage                                      initiation of antibiotics    Strongly encourage escalation           of antibiotics                                     -----------------------------                                           PCT <= 0.25 ng/mL                                                 OR                                        >  80% decrease in PCT                                     Discontinue / Do not initiate                                             antibiotics Performed at [facility], [address] [place], [place] [address]   Pathologist smear review     Status: None   Collection Time: 10/29/18 10:56 AM  Result Value Ref Range   Path Review Peripheral blood smear is reviewed.     Comment: Pancytopenia. Absolute leukopenia with neutropenia and ANC of 600/uL. No circulating blasts. Severe normocytic anemia with moderate anisocytosis. Thombocytopenia, without platelet clumping. The cause of the patient's pancytopenia is not entirely clear from morphologic evalution. Cytopenias are likely secondary to the patient's history of lung malignancy and recent chemotherapy are noted. Close clinical followup recommended. Reviewed by Kathi Simpers, M.D. Performed at [facility], [address] [place], [place] [address]   Blood Culture ID Panel (Reflexed)     Status: Abnormal   Collection Time: 10/29/18 10:56 AM  Result Value Ref Range   Enterococcus species NOT DETECTED NOT DETECTED   Listeria monocytogenes NOT DETECTED NOT DETECTED   Staphylococcus species NOT DETECTED NOT DETECTED   Staphylococcus aureus (BCID) NOT DETECTED NOT DETECTED   Streptococcus species DETECTED (A) NOT  DETECTED    Comment: Not Enterococcus species, Streptococcus agalactiae, Streptococcus pyogenes, or Streptococcus pneumoniae. CRITICAL RESULT CALLED TO, READ BACK BY AND VERIFIED WITH: DAVID BESANTI AT 0207 ON 10/30/2018 SNG    Streptococcus agalactiae NOT DETECTED NOT DETECTED   Streptococcus pneumoniae NOT DETECTED NOT DETECTED   Streptococcus pyogenes NOT DETECTED NOT DETECTED   Acinetobacter baumannii NOT DETECTED NOT DETECTED   Enterobacteriaceae species NOT DETECTED NOT DETECTED   Enterobacter cloacae complex NOT DETECTED NOT DETECTED   Escherichia coli NOT DETECTED NOT DETECTED   Klebsiella oxytoca NOT DETECTED NOT DETECTED   Klebsiella pneumoniae NOT DETECTED NOT DETECTED   Proteus species NOT DETECTED NOT DETECTED   Serratia marcescens NOT DETECTED NOT DETECTED   Haemophilus influenzae NOT DETECTED NOT DETECTED   Neisseria meningitidis NOT DETECTED NOT DETECTED   Pseudomonas aeruginosa NOT DETECTED NOT DETECTED   Candida albicans NOT DETECTED NOT DETECTED   Candida glabrata NOT DETECTED NOT DETECTED   Candida krusei NOT DETECTED NOT DETECTED   Candida parapsilosis NOT DETECTED NOT DETECTED   Candida tropicalis NOT DETECTED NOT DETECTED    Comment: Performed at [facility], [address] [place], [place] [address]  Blood Culture (routine x 2)     Status: None (Preliminary result)   Collection Time: 10/29/18 10:59 AM   Specimen: BLOOD  Result Value Ref Range   Specimen Description BLOOD LEFT ANTECUBITAL    Special Requests      BOTTLES DRAWN AEROBIC AND ANAEROBIC Blood Culture adequate volume   Culture  Setup Time      GRAM POSITIVE COCCI IN BOTH AEROBIC AND ANAEROBIC BOTTLES CRITICAL VALUE NOTED.  VALUE IS CONSISTENT WITH PREVIOUSLY REPORTED AND CALLED VALUE. Performed at [facility], [address], [place],  [address]    Culture GRAM POSITIVE COCCI    Report Status PENDING   Prepare RBC  Status: None   Collection Time: 10/29/18 12:04  PM  Result Value Ref Range   Order Confirmation      ORDER PROCESSED BY BLOOD BANK Performed at [facility], [address] [place], [place] [address]   SARS Coronavirus 2 [facility] order, Performed in [facility] hospital lab) Nasopharyngeal Nasopharyngeal Swab     Status: None   Collection Time: 10/29/18 12:08 PM   Specimen: Nasopharyngeal Swab  Result Value Ref Range   SARS Coronavirus 2 NEGATIVE NEGATIVE    Comment: (NOTE) If result is NEGATIVE SARS-CoV-2 target nucleic acids are NOT DETECTED. The SARS-CoV-2 RNA is generally detectable in upper and lower  respiratory specimens during the acute phase of infection. The lowest  concentration of SARS-CoV-2 viral copies this assay can detect is 250  copies / mL. A negative result does not preclude SARS-CoV-2 infection  and should not be used as the sole basis for treatment or other  patient management decisions.  A negative result may occur with  improper specimen collection / handling, submission of specimen other  than nasopharyngeal swab, presence of viral mutation(s) within the  areas targeted by this assay, and inadequate number of viral copies  (<250 copies / mL). A negative result must be combined with clinical  observations, patient history, and epidemiological information. If result is POSITIVE SARS-CoV-2 target nucleic acids are DETECTED. The SARS-CoV-2 RNA is generally detectable in upper and lower  respiratory specimens dur ing the acute phase of infection.  Positive  results are indicative of active infection with SARS-CoV-2.  Clinical  correlation with patient history and other diagnostic information is  necessary to determine patient infection status.  Positive results do  not rule out bacterial infection or co-infection with other viruses. If result is PRESUMPTIVE POSTIVE SARS-CoV-2 nucleic acids MAY BE PRESENT.   A presumptive positive result was obtained on the submitted specimen  and confirmed on  repeat testing.  While 2019 novel coronavirus  (SARS-CoV-2) nucleic acids may be present in the submitted sample  additional confirmatory testing may be necessary for epidemiological  and / or clinical management purposes  to differentiate between  SARS-CoV-2 and other Sarbecovirus currently known to infect humans.  If clinically indicated additional testing with an alternate test  methodology [id number]) is advised. The SARS-CoV-2 RNA is generally  detectable in upper and lower respiratory sp ecimens during the acute  phase of infection. The expected result is Negative. Fact Sheet for Patients:  StrictlyIdeas.no Fact Sheet for Healthcare Providers: BankingDealers.co.za This test is not yet approved or cleared by the [place] FDA and has been authorized for detection and/or diagnosis of SARS-CoV-2 by FDA under an Emergency Use Authorization (EUA).  This EUA will remain in effect (meaning this test can be used) for the duration of the COVID-19 declaration under Section 564(b)(1) of the Act, 21 U.S.C. section 360bbb-3(b)(1), unless the authorization is terminated or revoked sooner. Performed at [facility] [address] [place], [place] [address]   Urinalysis, Complete w Microscopic     Status: Abnormal   Collection Time: 10/29/18  1:54 PM  Result Value Ref Range   Color, Urine AMBER (A) YELLOW    Comment: BIOCHEMICALS MAY BE AFFECTED BY COLOR   APPearance CLOUDY (A) CLEAR   Specific Gravity, Urine 1.018 1.005 - 1.030   pH 6.0 5.0 - 8.0   Glucose, UA NEGATIVE NEGATIVE mg/dL   Hgb urine dipstick MODERATE (A) NEGATIVE   Bilirubin Urine NEGATIVE NEGATIVE   Ketones, ur NEGATIVE  NEGATIVE mg/dL   Protein, ur 100 (A) NEGATIVE mg/dL   Nitrite NEGATIVE NEGATIVE   Leukocytes,Ua MODERATE (A) NEGATIVE   RBC / HPF >50 (H) 0 - 5 RBC/hpf   WBC, UA >50 (H) 0 - 5 WBC/hpf   Bacteria, UA MANY (A) NONE SEEN   Squamous Epithelial / LPF  NONE SEEN 0 - 5   WBC Clumps PRESENT    Mucus PRESENT    Hyaline Casts, UA PRESENT    Non Squamous Epithelial PRESENT (A) NONE SEEN    Comment: Performed at [facility], [address] [place], [place] [address]  Lactic acid, plasma     Status: Abnormal   Collection Time: 10/29/18  1:54 PM  Result Value Ref Range   Lactic Acid, Venous 4.1 (HH) 0.5 - 1.9 mmol/L    Comment: CRITICAL RESULT CALLED TO, READ BACK BY AND VERIFIED WITH KIM MAIN AT 1444 10/29/2018 KMP/MLK Performed at [facility], [address] [place], [place] [address]   Type and screen Ordered by PROVIDER DEFAULT     Status: None (Preliminary result)   Collection Time: 10/29/18  2:43 PM  Result Value Ref Range   ABO/RH(D) A POS    Antibody Screen NEG    Sample Expiration 11/01/2018,2359    Unit Number [id number]    Blood Component Type RBC, LR IRR    Unit division 00    Status of Unit ISSUED,FINAL    Transfusion Status OK TO TRANSFUSE    Crossmatch Result Compatible    Unit Number [id number]    Blood Component Type RBC, LR IRR    Unit division 00    Status of Unit ISSUED    Unit tag comment IRRADIATED PRODUCT    Transfusion Status OK TO TRANSFUSE    Crossmatch Result      Compatible Performed at [facility], [address] [place], [place] [address]   Lactic acid, plasma     Status: None   Collection Time: 10/29/18  8:38 PM  Result Value Ref Range   Lactic Acid, Venous 1.3 0.5 - 1.9 mmol/L    Comment: Performed at [facility], [address] [place], [place] [address]  Lactate dehydrogenase     Status: None   Collection Time: 10/29/18  8:38 PM  Result Value Ref Range   LDH 102 98 - 192 U/L    Comment: Performed at [facility], [address] [place], [place] [address]  Bilirubin, fractionated(tot/dir/indir)     Status: Abnormal   Collection Time: 10/29/18  8:38 PM  Result Value Ref Range   Total Bilirubin 1.5 (H) 0.3 - 1.2 mg/dL    Bilirubin, Direct 0.5 (H) 0.0 - 0.2 mg/dL   Indirect Bilirubin 1.0 (H) 0.3 - 0.9 mg/dL    Comment: Performed at [facility], [address] [place], [place] [address]  CBC     Status: Abnormal   Collection Time: 10/30/18  5:48 AM  Result Value Ref Range   WBC 0.4 (LL) 4.0 - 10.5 K/uL    Comment: CRITICAL VALUE NOTED.  VALUE IS CONSISTENT WITH PREVIOUSLY REPORTED AND CALLED VALUE.   RBC 1.77 (L) 3.87 - 5.11 MIL/uL   Hemoglobin 5.1 (L) 12.0 - 15.0 g/dL   HCT 15.6 (L) 36.0 - 46.0 %   MCV 88.1 80.0 - 100.0 fL   MCH 28.8 26.0 - 34.0 pg   MCHC 32.7 30.0 - 36.0 g/dL   RDW 20.8 (H) 11.5 - 15.5 %  Platelets 20 (LL) 150 - 400 K/uL    Comment: Immature Platelet Fraction may be clinically indicated, consider ordering this additional test [id number] CRITICAL VALUE NOTED.  VALUE IS CONSISTENT WITH PREVIOUSLY REPORTED AND CALLED VALUE.    nRBC 0.0 0.0 - 0.2 %    Comment: Performed at [facility], [address] [place], [place] [address]  Comprehensive metabolic panel     Status: Abnormal   Collection Time: 10/30/18  5:48 AM  Result Value Ref Range   Sodium 136 135 - 145 mmol/L   Potassium 3.4 (L) 3.5 - 5.1 mmol/L   Chloride 103 98 - 111 mmol/L   CO2 20 (L) 22 - 32 mmol/L   Glucose, Bld 92 70 - 99 mg/dL   BUN 15 8 - 23 mg/dL   Creatinine, Ser 0.69 0.44 - 1.00 mg/dL   Calcium 6.6 (L) 8.9 - 10.3 mg/dL   Total Protein 5.4 (L) 6.5 - 8.1 g/dL   Albumin 2.9 (L) 3.5 - 5.0 g/dL   AST 25 15 - 41 U/L   ALT 31 0 - 44 U/L   Alkaline Phosphatase 120 38 - 126 U/L   Total Bilirubin 1.0 0.3 - 1.2 mg/dL   GFR calc non Af Amer >60 >60 mL/min   GFR calc Af Amer >60 >60 mL/min   Anion gap 13 5 - 15    Comment: Performed at [facility], [address] [place], [place] [address]  Magnesium     Status: Abnormal   Collection Time: 10/30/18  5:48 AM  Result Value Ref Range   Magnesium 1.3 (L) 1.7 - 2.4 mg/dL    Comment: Performed at [facility], [address] [place], [place] [address]  Prepare RBC     Status: None   Collection Time: 10/30/18  7:39 AM  Result Value Ref Range   Order Confirmation      ORDER PROCESSED BY BLOOD BANK Performed at [facility], [address], [place], [place] [address]     US Abdomen Limited Ruq  Result Date: 10/30/2018 CLINICAL DATA:  Elevated LFTs EXAM: ULTRASOUND ABDOMEN LIMITED RIGHT UPPER QUADRANT COMPARISON:  CT abdomen and pelvis 09/22/2018 FINDINGS: Gallbladder: Surgically absent Common bile duct: Diameter: 4 mm diameter, normal Liver: Mildly heterogeneous upper normal echogenicity of liver with nodular contours consistent with cirrhosis. No discrete hepatic mass lesion identified, though assessment of intrahepatic detail is slightly limited due to sound attenuation. Portal vein is patent on color Doppler imaging with normal direction of blood flow towards the liver. Other: No RIGHT upper quadrant free fluid. IMPRESSION: Post cholecystectomy. Cirrhotic appearing liver without definite mass. Electronically Signed   By: Lavonia Dana M.D.   On: 10/30/2018 09:57    Review of Systems  Constitutional: Positive for fever and malaise/fatigue. Negative for chills and weight loss.  HENT: Negative for congestion, hearing loss and sinus pain.   Eyes: Negative for blurred vision and double vision.  Respiratory: Negative for cough, sputum production, shortness of breath and wheezing.   Cardiovascular: Negative for chest pain, palpitations, orthopnea and leg swelling.  Gastrointestinal: Negative for abdominal pain, constipation, diarrhea, nausea and vomiting.  Genitourinary: Negative for dysuria, flank pain, frequency, hematuria and urgency.  Musculoskeletal: Negative for back pain, falls and joint pain.  Skin: Negative for itching and rash.  Neurological: Positive for headaches. Negative for dizziness.  Psychiatric/Behavioral: Negative for depression, substance abuse and suicidal ideas. The patient is not  nervous/anxious.    Blood pressure 135/71, pulse  99, temperature 98 F (36.7 C), resp. rate 18, height 5\' 5"  (1.651 m), weight 73.5 kg, SpO2 100 %. Physical Exam  Nursing note and vitals reviewed. Constitutional: She is oriented to person, place, and time. She appears well-developed and well-nourished.  HENT:  Head: Normocephalic and atraumatic.  Cardiovascular: Normal rate and regular rhythm.  Respiratory: Effort normal and breath sounds normal.  GI: Soft. Bowel sounds are normal.  Genitourinary:       Genitourinary Comments: 3 cm x 2 cm ulcerated, superficial wound, red base. Small area of possible necrosis at the center of ulcer. Appears to of been a boil which ruptured. Area of surrounding induration represented by dotted red line on drawling. No purulent drainage present.   Musculoskeletal: Normal range of motion.  Neurological: She is alert and oriented to person, place, and time.  Skin: Skin is warm and dry.  Psychiatric: She has a normal mood and affect. Her behavior is normal. Judgment and thought content normal.    Assessment/Plan: 68 yo with perineal abscess.  Mostly indurated skin with an open superficial ulcerated patch of skin. Would recommend twice daily application of silvadene cream. Continue antibiotics until resolution. Infectious disease is following and will make recommendations. No surgical drainage needed. Would not be possible at this time given her low platelet count.   Thank you for this consultation. Please call with any questions or if further care is needed.  Efrata Brunner R Brenan Modesto 10/30/2018, 4:32 PM

## 2018-10-30 NOTE — Progress Notes (Signed)
[employer] and stated patient is to be NPO starting now 0015 due to Abdominal US in the morning. Patient made aware. Will continue to monitor.

## 2018-10-30 NOTE — Consult Note (Signed)
Hematology/Oncology Consult note [facility] Telephone:([phone] [phone]  Patient Care Team: Leone Haven, MD as PCP - General (Family Medicine) Leone Haven, MD as Consulting Physician (Family Medicine) Bary Castilla, Forest Gleason, MD (General Surgery) Telford Nab, RN as Registered Nurse   Name of the patient: Anne [name]  [id number]  [birth year]    Reason for consult: extensive stage small cell lung cancer   Requesting physician: Dr. 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 She is s/p 4 cycles of carbo/etoposide/tecentriq. Last cycle on 9/15 with onpro neulasta support. She has been admitted for severe sepsis possibly due to UTI and pancytopenia. She reports having possible labial abscess which was making it difficult for her to sit but feels like it is improving. She feels better since her admission. Denies any sob or diarrhea  ECOG PS- 2  Pain scale- 0   Review of systems- Review of Systems  Constitutional: Negative for chills, fever, malaise/fatigue and weight loss.  HENT: Negative for congestion, ear discharge and nosebleeds.   Eyes: Negative for blurred vision.  Respiratory: Negative for cough, hemoptysis, sputum production, shortness of breath and wheezing.   Cardiovascular: Negative for chest pain, palpitations, orthopnea and claudication.  Gastrointestinal: Negative for abdominal pain, blood in stool, constipation, diarrhea, heartburn, melena, nausea and vomiting.  Genitourinary: Negative for dysuria, flank pain, frequency, hematuria and urgency.  Musculoskeletal: Negative for back pain, joint pain and myalgias.  Skin: Negative for rash.  Neurological: Negative for dizziness, tingling, focal weakness, seizures, weakness and headaches.  Endo/Heme/Allergies: Does not bruise/bleed easily.    Psychiatric/Behavioral: Negative for depression and suicidal ideas. The patient does not have insomnia.     Allergies  Allergen Reactions   Bee Venom Swelling    Patient Active Problem List   Diagnosis Date Noted   Sepsis ([place]) 10/29/2018   Goals of care, counseling/discussion 08/06/2018   Small cell lung cancer, right upper lobe ([place]) 08/06/2018   Bone metastases ([place]) 08/06/2018   Bleeding nose 02/26/2018   Vision changes 02/26/2018   Skin tear of forearm without complication, initial encounter 12/06/2017   Bruising 12/06/2017   Obesity (BMI 30.0-34.9) 08/05/2017   Ventral hernia 03/01/2017   Abdominal wall hernia 02/15/2017   Lung nodule < 6cm on CT 02/15/2017   Anemia 01/23/2017   Cirrhosis of liver ([place]) 01/23/2017   Gastritis 01/23/2017   Chest pain 01/03/2017   Hypokalemia 01/03/2017   Weight loss 12/13/2016   Callus of foot 07/27/2016   Heart murmur 07/27/2016   Right calf pain 04/20/2016   Chronic low back pain 08/15/2015   Right shoulder pain 06/14/2015   Benign essential HTN 02/22/2015   HLD (hyperlipidemia) 02/22/2015   Depression 02/22/2015     Past Medical History:  Diagnosis Date   Arthritis    Cirrhosis ([place])    Depression    GERD (gastroesophageal reflux disease)    Hyperlipidemia    Hypertension    Lung cancer ([place])    Metastatic bone cancer ([place])      Past Surgical History:  Procedure Laterality Date   APPENDECTOMY  1971   CHOLECYSTECTOMY  1971   COLONOSCOPY WITH PROPOFOL N/A 11/15/2016   Procedure: COLONOSCOPY WITH PROPOFOL;  Surgeon: Jonathon Bellows, MD;  Location: [facility] ENDOSCOPY;  Service: Gastroenterology;  Laterality: N/A;   ENDOBRONCHIAL ULTRASOUND Right 07/30/2018   Procedure: ENDOBRONCHIAL ULTRASOUND;  Surgeon: Laverle Hobby, MD;  Location: [facility] ORS;  Service: Pulmonary;  Laterality: Right;   ESOPHAGOGASTRODUODENOSCOPY (EGD) WITH PROPOFOL N/A 01/07/2017   Procedure:  ESOPHAGOGASTRODUODENOSCOPY (EGD) WITH PROPOFOL;  Surgeon: Jonathon Bellows, MD;  Location: [facility] ENDOSCOPY;  Service: Gastroenterology;  Laterality: N/A;   LAPAROSCOPY N/A 03/01/2017   Procedure: LAPAROSCOPY DIAGNOSTIC;  Surgeon: Robert Bellow, MD;  Location: [facility] ORS;  Service: General;  Laterality: N/A;   PORTA CATH INSERTION N/A 08/14/2018   Procedure: PORTA CATH INSERTION;  Surgeon: Algernon Huxley, MD;  Location: [facility];  Service: Cardiovascular;  Laterality: N/A;   TONSILECTOMY, ADENOIDECTOMY, BILATERAL MYRINGOTOMY AND TUBES  1955   TONSILLECTOMY     VENTRAL HERNIA REPAIR N/A 03/01/2017   10 x 14 CM Ventralight ST mesh, intraperitoneal location.    VENTRAL HERNIA REPAIR N/A 03/01/2017   Procedure: HERNIA REPAIR VENTRAL ADULT;  Surgeon: Robert Bellow, MD;  Location: [facility] ORS;  Service: General;  Laterality: N/A;    Social History   Socioeconomic History   Marital status: Married    Spouse name: Johnny   Number of children: Not on file   Years of education: Not on file   Highest education level: Not on file  Occupational History   Not on file  Social Needs   Financial resource strain: Not hard at all   Food insecurity    Worry: Never true    Inability: Never true   Transportation needs    Medical: No    Non-medical: No  Tobacco Use   Smoking status: Former Smoker    Packs/day: 2.00    Years: 50.00    Pack years: 100.00    Types: Cigarettes, E-cigarettes    Quit date: 02/07/2012    Years since quitting: 6.7   Smokeless tobacco: Never Used  Substance and Sexual Activity   Alcohol use: No    Alcohol/week: 0.0 standard drinks   Drug use: No   Sexual activity: Yes  Lifestyle   Physical activity    Days per week: Patient refused    Minutes per session: Patient refused   Stress: Not at all  Relationships   Social connections    Talks on phone: Patient refused    Gets together: Patient refused    Attends religious service: Patient  refused    Active member of club or organization: Patient refused    Attends meetings of clubs or organizations: Patient refused    Relationship status: Patient refused   Intimate partner violence    Fear of current or ex partner: No    Emotionally abused: No    Physically abused: No    Forced sexual activity: No  Other Topics Concern   Not on file  Social History Narrative   Married   Retired   Clinical cytogeneticist level of education    No children    1 cup of coffee     Family History  Problem Relation Age of Onset   Hypertension Mother    Ovarian cancer Mother 69   Heart disease Father    Stroke Father    Ovarian cancer Sister        ? dx cancer had hyst.   Breast cancer Neg Hx      Current Facility-Administered Medications:    0.9 %  sodium chloride infusion (Manually program via Guardrails IV Fluids), , Intravenous, Once, Demetrios Loll, MD   0.9 %  sodium chloride infusion, , Intravenous, Continuous, Mayo, Pete Pelt, MD, Last Rate: 75  mL/hr at 10/29/18 2211   acetaminophen (TYLENOL) tablet 650 mg, 650 mg, Oral, Q6H PRN **OR** acetaminophen (TYLENOL) suppository 650 mg, 650 mg, Rectal, Q6H PRN, Mayo, Pete Pelt, MD   buPROPion (WELLBUTRIN XL) 24 hr tablet 300 mg, 300 mg, Oral, Daily, Mayo, Pete Pelt, MD, 300 mg at 10/30/18 0916   cefTRIAXone (ROCEPHIN) 2 g in sodium chloride 0.9 % 100 mL IVPB, 2 g, Intravenous, Q24H, [place], Bing Neighbors, NP, Last Rate: 200 mL/hr at 10/30/18 0453, 2 g at 10/30/18 0453   Chlorhexidine Gluconate Cloth 2 % PADS 6 each, 6 each, Topical, Daily, Demetrios Loll, MD   folic acid (FOLVITE) tablet 1 mg, 1 mg, Oral, Daily, Mayo, Pete Pelt, MD, 1 mg at 10/30/18 9702   loperamide (IMODIUM) capsule 2 mg, 2 mg, Oral, Q2H PRN, Mayo, Pete Pelt, MD   LORazepam (ATIVAN) tablet 0.5 mg, 0.5 mg, Oral, Q6H PRN, Mayo, Pete Pelt, MD   multivitamin-lutein (OCUVITE-LUTEIN) capsule 1 capsule, 1 capsule, Oral, Daily, Mayo, Pete Pelt, MD, 1 capsule at  10/30/18 0917   ondansetron (ZOFRAN) tablet 4 mg, 4 mg, Oral, Q6H PRN, 4 mg at 10/30/18 1252 **OR** ondansetron (ZOFRAN) injection 4 mg, 4 mg, Intravenous, Q6H PRN, Mayo, Pete Pelt, MD   oxyCODONE (Oxy IR/ROXICODONE) immediate release tablet 5 mg, 5 mg, Oral, Q4H PRN, Mayo, Pete Pelt, MD   pantoprazole (PROTONIX) EC tablet 40 mg, 40 mg, Oral, BID AC, Mayo, Pete Pelt, MD, 40 mg at 10/30/18 0916   polyethylene glycol (MIRALAX / GLYCOLAX) packet 17 g, 17 g, Oral, Daily PRN, Mayo, Pete Pelt, MD   rosuvastatin (CRESTOR) tablet 20 mg, 20 mg, Oral, Daily, Mayo, Pete Pelt, MD, 20 mg at 10/30/18 0916   Physical exam:  Vitals:   10/29/18 1953 10/29/18 2044 10/30/18 0414 10/30/18 0745  BP: 108/67 101/62 117/60 111/63  Pulse: 97 89 (!) 101 90  Resp: 17 (!) 22 18 20   Temp: 98.2 F (36.8 C) 99.2 F (37.3 C) 99.2 F (37.3 C) 99 F (37.2 C)  TempSrc: Oral Oral Oral Oral  SpO2: 100% 100% 100% 95%  Weight:      Height:       Physical Exam Constitutional:      General: She is not in acute distress.    Comments: Appears pale.  HENT:     Head: Normocephalic and atraumatic.  Eyes:     Pupils: Pupils are equal, round, and reactive to light.  Neck:     Musculoskeletal: Normal range of motion.  Cardiovascular:     Rate and Rhythm: Normal rate and regular rhythm.     Heart sounds: Normal heart sounds.     Comments: Port site does not appear infected Pulmonary:     Effort: Pulmonary effort is normal.     Breath sounds: Normal breath sounds.  Abdominal:     General: Bowel sounds are normal.     Palpations: Abdomen is soft.  Skin:    General: Skin is warm and dry.  Neurological:     Mental Status: She is alert and oriented to person, place, and time.        CMP Latest Ref Rng & Units 10/30/2018  Glucose 70 - 99 mg/dL 92  BUN 8 - 23 mg/dL 15  Creatinine 0.44 - 1.00 mg/dL 0.69  Sodium 135 - 145 mmol/L 136  Potassium 3.5 - 5.1 mmol/L 3.4(L)  Chloride 98 - 111 mmol/L 103  CO2 22 - 32  mmol/L 20(L)  Calcium 8.9 - 10.3 mg/dL 6.6(L)  Total Protein 6.5 - 8.1 g/dL 5.4(L)  Total Bilirubin 0.3 - 1.2 mg/dL 1.0  Alkaline Phos 38 - 126 U/L 120  AST 15 - 41 U/L 25  ALT 0 - 44 U/L 31   CBC Latest Ref Rng & Units 10/30/2018  WBC 4.0 - 10.5 K/uL 0.4(LL)  Hemoglobin 12.0 - 15.0 g/dL 5.1(L)  Hematocrit 36.0 - 46.0 % 15.6(L)  Platelets 150 - 400 K/uL 20(LL)    @IMAGES @  Ct Head Wo Contrast  Result Date: 10/15/2018 CLINICAL DATA:  Altered level of consciousness EXAM: CT HEAD WITHOUT CONTRAST TECHNIQUE: Contiguous axial images were obtained from the base of the skull through the vertex without intravenous contrast. COMPARISON:  None. FINDINGS: Brain: No evidence of acute infarction, hemorrhage, extra-axial collection, ventriculomegaly, or mass effect. Generalized cerebral atrophy. Periventricular white matter low attenuation likely secondary to microangiopathy. Vascular: Cerebrovascular atherosclerotic calcifications are noted. Skull: Negative for fracture or focal lesion. Sinuses/Orbits: Visualized portions of the orbits are unremarkable. Visualized portions of the paranasal sinuses and mastoid air cells are unremarkable. Other: None. IMPRESSION: No acute intracranial pathology. Electronically Signed   By: Kathreen Devoid   On: 10/15/2018 11:28   US Abdomen Limited Ruq  Result Date: 10/30/2018 CLINICAL DATA:  Elevated LFTs EXAM: ULTRASOUND ABDOMEN LIMITED RIGHT UPPER QUADRANT COMPARISON:  CT abdomen and pelvis 09/22/2018 FINDINGS: Gallbladder: Surgically absent Common bile duct: Diameter: 4 mm diameter, normal Liver: Mildly heterogeneous upper normal echogenicity of liver with nodular contours consistent with cirrhosis. No discrete hepatic mass lesion identified, though assessment of intrahepatic detail is slightly limited due to sound attenuation. Portal vein is patent on color Doppler imaging with normal direction of blood flow towards the liver. Other: No RIGHT upper quadrant free fluid.  IMPRESSION: Post cholecystectomy. Cirrhotic appearing liver without definite mass. Electronically Signed   By: Lavonia Dana M.D.   On: 10/30/2018 09:57    Assessment and plan- Patient is a 68 y.o. female with extensive stage small cell lung cancer and bone metastases admitted for severe sepsis secondary to gram positive bacteremia and possible UTI  1. Pancytopenia: likely combination of chemotherapy and ongoing severe sepsis. 2/2 blood cx shows streptococcus. UA positive. Urine cx pending. covid testing was unremarkable. She is hemodynamically stable and afebrile. She had elevated LFT's on admission which have normalized. On ceftriaxone and vancomycin. Consider TEE given 2/2 blood cx positive for gram positive strep. Consider ID consult for antibiotic recommendations. Prefer that port is not taken out unless deemed necessary. She needs venous access for outpatient treatment  She already received neulasta with 4th cycle of chemo. No need to give neupogen Transfuse irradiated blood products to keep hb >7 and platelets >10.  2. She does have baseline hypocalcemia- please monitor serum calcium as an inpatient.   3. Left labial abscess- does not appear significant on external exam. No pus pointing or local tenderness. GYN to see the patient  Will follow. Treatment on hold until acute issues resolve     Visit Diagnosis 1. Sepsis, due to unspecified organism, unspecified whether acute organ dysfunction present ([place])   2. Pancytopenia ([place])   3. Symptomatic anemia   4. Elevated liver enzymes     Dr. Randa Evens, MD, MPH [facility] [phone] 10/30/2018 3:05 PM

## 2018-10-30 NOTE — Care Management Important Message (Signed)
Important Message  Patient Details  Name: KIERRIA FEIGENBAUM MRN: [id number] Date of Birth: [birth year]   Medicare Important Message Given:  Yes  Initial Medicare IM given by Patient Access Associate on 10/30/2018 at 10:54am.    Dannette Barbara 10/30/2018, 12:43 PM

## 2018-10-30 NOTE — Consult Note (Signed)
Pharmacy Antibiotic Note  Anne [name] is a 68 y.o. female admitted on 10/29/2018 with sepsis/bacteremia/labial abscess.  Pharmacy has been consulted for Vancomycin  Dosing. Patient also on Ceftriaxone 2 gm q24h eval for Endocarditis  Plan: 1) Vancomycin 1500 mg IV Q 24 hrs. Goal AUC 400-550. Expected AUC: 532.1 Expected Css: 11.8 SCr used: 0.8(actual 0.69)  F/u Bcx   Height: 5\' 5"  (165.1 cm) Weight: 162 lb (73.5 kg) IBW/kg (Calculated) : 57  Temp (24hrs), Avg:98.7 F (37.1 C), Min:98.2 F (36.8 C), Max:99.2 F (37.3 C)  Recent Labs  Lab 10/28/18 1045 10/29/18 0931 10/29/18 1056 10/29/18 1354 10/29/18 2038 10/30/18 0548  WBC 10.4 0.7* 0.8*  --   --  0.4*  CREATININE 0.73 0.81 0.71  --   --  0.69  LATICACIDVEN  --   --  3.0* 4.1* 1.3  --     Estimated Creatinine Clearance: 65.7 mL/min (by C-G formula based on SCr of 0.69 mg/dL).    Allergies  Allergen Reactions  . Bee Venom Swelling    Antimicrobials this admission: Vancomycin 9/23 >>  Cefepime 9/23 >> 9/24 CTX 9/24 >>   Microbiology results: 9/23 BCx: [facility]PC in 4/4 9/23 UCx: pending  REsp panel: neg   Thank you for allowing pharmacy to be a part of this patient's care.  Rosemary Mossbarger A 10/30/2018 2:24 PM

## 2018-10-30 NOTE — Progress Notes (Signed)
PHARMACY - PHYSICIAN COMMUNICATION CRITICAL VALUE ALERT - BLOOD CULTURE IDENTIFICATION (BCID)  Anne [name] is an 68 y.o. female who presented to [facility] on 10/29/2018 with a chief complaint of Emesis w/ h/o lung CA w/ mets to bone on chemo and neutropenic.  Assessment:  HR 100 - 120's, RR 22- 26, WBC 0.7 >> 0.8, LA 3.0 >> 4.1 >> 1.3, UA leuk +, many bacteria, WBC UA > 50, patient also has a labial abscess that is resolving, and an implanted port on R chest placed 07/09, 3/4 GPC BCID Streptococcus species.  Name of physician (or Provider) Contacted: Rufina Falco; Jan Mansy  Current antibiotics: Vanc/cefepime/flagyl  Changes to prescribed antibiotics recommended:  Recommendations accepted by provider -- Will discontinue all abx and will start ceftriaxone 2g IV daily starting now for strep bacteremia, will continue to monitor s/sx infx.  Results for orders placed or performed during the hospital encounter of 10/29/18  Blood Culture ID Panel (Reflexed) (Collected: 10/29/2018 10:56 AM)  Result Value Ref Range   Enterococcus species NOT DETECTED NOT DETECTED   Listeria monocytogenes NOT DETECTED NOT DETECTED   Staphylococcus species NOT DETECTED NOT DETECTED   Staphylococcus aureus (BCID) NOT DETECTED NOT DETECTED   Streptococcus species DETECTED (A) NOT DETECTED   Streptococcus agalactiae NOT DETECTED NOT DETECTED   Streptococcus pneumoniae NOT DETECTED NOT DETECTED   Streptococcus pyogenes NOT DETECTED NOT DETECTED   Acinetobacter baumannii NOT DETECTED NOT DETECTED   Enterobacteriaceae species NOT DETECTED NOT DETECTED   Enterobacter cloacae complex NOT DETECTED NOT DETECTED   Escherichia coli NOT DETECTED NOT DETECTED   Klebsiella oxytoca NOT DETECTED NOT DETECTED   Klebsiella pneumoniae NOT DETECTED NOT DETECTED   Proteus species NOT DETECTED NOT DETECTED   Serratia marcescens NOT DETECTED NOT DETECTED   Haemophilus influenzae NOT DETECTED NOT DETECTED   Neisseria meningitidis  NOT DETECTED NOT DETECTED   Pseudomonas aeruginosa NOT DETECTED NOT DETECTED   Candida albicans NOT DETECTED NOT DETECTED   Candida glabrata NOT DETECTED NOT DETECTED   Candida krusei NOT DETECTED NOT DETECTED   Candida parapsilosis NOT DETECTED NOT DETECTED   Candida tropicalis NOT DETECTED NOT DETECTED   Tobie Lords, PharmD, BCPS Clinical Pharmacist 10/30/2018  4:22 AM

## 2018-10-30 NOTE — Progress Notes (Signed)
[place] [facility] [place] NAME: Anne [name]    MR#:  [id number]  DATE OF BIRTH:  1950-03-01  SUBJECTIVE:  CHIEF COMPLAINT:   Chief Complaint  Patient presents with   Emesis   The patient has no complaints. REVIEW OF SYSTEMS:  Review of Systems  Constitutional: Negative for chills, fever and malaise/fatigue.  HENT: Negative for sore throat.   Eyes: Negative for blurred vision and double vision.  Respiratory: Negative for cough, hemoptysis, shortness of breath, wheezing and stridor.   Cardiovascular: Negative for chest pain, palpitations, orthopnea and leg swelling.  Gastrointestinal: Negative for abdominal pain, blood in stool, diarrhea, melena, nausea and vomiting.  Genitourinary: Negative for dysuria, flank pain and hematuria.  Musculoskeletal: Negative for back pain and joint pain.  Skin: Negative for rash.  Neurological: Negative for dizziness, sensory change, focal weakness, seizures, loss of consciousness, weakness and headaches.  Endo/Heme/Allergies: Negative for polydipsia.  Psychiatric/Behavioral: Negative for depression. The patient is not nervous/anxious.     DRUG ALLERGIES:   Allergies  Allergen Reactions   Bee Venom Swelling   VITALS:  Blood pressure 111/63, pulse 90, temperature 99 F (37.2 C), temperature source Oral, resp. rate 20, height 5\' 5"  (1.651 m), weight 73.5 kg, SpO2 95 %. PHYSICAL EXAMINATION:  Physical Exam Constitutional:      General: She is not in acute distress.    Appearance: Normal appearance.  HENT:     Head: Normocephalic.     Mouth/Throat:     Mouth: Mucous membranes are moist.  Eyes:     General: No scleral icterus.    Conjunctiva/sclera: Conjunctivae normal.     Pupils: Pupils are equal, round, and reactive to light.  Neck:     Musculoskeletal: Normal range of motion and neck supple.     Vascular: No JVD.     Trachea: No tracheal deviation.  Cardiovascular:     Rate and Rhythm:  Normal rate and regular rhythm.     Heart sounds: Normal heart sounds. No murmur. No gallop.   Pulmonary:     Effort: Pulmonary effort is normal. No respiratory distress.     Breath sounds: Normal breath sounds. No wheezing or rales.  Abdominal:     General: Bowel sounds are normal. There is no distension.     Palpations: Abdomen is soft.     Tenderness: There is no abdominal tenderness. There is no rebound.  Musculoskeletal: Normal range of motion.        General: No tenderness.     Right lower leg: No edema.     Left lower leg: No edema.  Skin:    Findings: No erythema or rash.  Neurological:     General: No focal deficit present.     Mental Status: She is alert and oriented to person, place, and time.     Cranial Nerves: No cranial nerve deficit.  Psychiatric:        Mood and Affect: Mood normal.    LABORATORY PANEL:  Female CBC Recent Labs  Lab 10/30/18 0548  WBC 0.4*  HGB 5.1*  HCT 15.6*  PLT 20*   ------------------------------------------------------------------------------------------------------------------ Chemistries  Recent Labs  Lab 10/30/18 0548  NA 136  K 3.4*  CL 103  CO2 20*  GLUCOSE 92  BUN 15  CREATININE 0.69  CALCIUM 6.6*  MG 1.3*  AST 25  ALT 31  ALKPHOS 120  BILITOT 1.0   RADIOLOGY:  US Abdomen Limited Ruq  Result Date:  10/30/2018 CLINICAL DATA:  Elevated LFTs EXAM: ULTRASOUND ABDOMEN LIMITED RIGHT UPPER QUADRANT COMPARISON:  CT abdomen and pelvis 09/22/2018 FINDINGS: Gallbladder: Surgically absent Common bile duct: Diameter: 4 mm diameter, normal Liver: Mildly heterogeneous upper normal echogenicity of liver with nodular contours consistent with cirrhosis. No discrete hepatic mass lesion identified, though assessment of intrahepatic detail is slightly limited due to sound attenuation. Portal vein is patent on color Doppler imaging with normal direction of blood flow towards the liver. Other: No RIGHT upper quadrant free fluid. IMPRESSION:  Post cholecystectomy. Cirrhotic appearing liver without definite mass. Electronically Signed   By: Lavonia Dana M.D.   On: 10/30/2018 09:57   ASSESSMENT AND PLAN:   Sepsis- possibly due to UTI (although patient denies any urinary symptoms) versus left labial abscess, gram-positive cocci bacteremia. -Continue Rocephin and vancomycin pharmacy to dose. -Follow-up blood and urine cultures -CXR is is unremarkable. RUQ [place]: Post cholecystectomy. ID consult and OB/GYN consult.  Lactic acidosis.  Improved.  Pancytopenia- likely due to chemotherapy -1 unit PRBC given.  Worsening neutropenia. Hemoglobin decreased to 5.1.  1 more unit irradiated PRBC transfusion, follow-up CBC.  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 Janese Banks as an outpatient -Oncology consult  Elevated liver enzymes / hyperbilirubinemia- may be due to sepsis, improved.  Hyperlipidemia-stable -Continue home Crestor  Depression-stable -Continue home Wellbutrin  GERD- stable -Continue home PPI  Hypokalemia.  Potassium supplement. Hypomagnesemia.  IV magnesium.  Discussed with Dr. Janese Banks. I called the patient daughter, nobody answered the phone. All the records are reviewed and case discussed with Care Management/Social Worker. Management plans discussed with the patient, family and they are in agreement.  CODE STATUS: Full Code  TOTAL TIME TAKING CARE OF THIS PATIENT: 38 minutes.   More than 50% of the time was spent in counseling/coordination of care: YES  POSSIBLE D/C IN 3 DAYS, DEPENDING ON CLINICAL CONDITION.   Demetrios Loll M.D on 10/30/2018 at 1:24 PM  Between 7am to 6pm - Pager - [phone]  After 6pm go to [email or web address] - [facility]

## 2018-10-30 NOTE — Consult Note (Signed)
NAME: Anne [name]  DOB: 07/08/1950  MRN: [id number]  Date/Time: 10/30/2018 7:58 PM  REQUESTING PROVIDER: Dr. Bridgett Larsson Subjective:  REASON FOR CONSULT: Streptococcus bacteremia ? Anne [name] is a 68 y.o. female with a history of metastatic lung cancer, cirrhosis of the liver, hyperlipidemia and hypertension presented to the ED on 10/29/2018 after being sent from the cancer center because of feeling sick chills cold and vomiting.  Patient went to the cancer center on 10/29/2018 to get blood transfusion but on arrival was found to be unwell and was taken to the emergency department.  In the ED her blood pressure was 103/74 , heart rate of 83, temperature of 100,  Labs revealed WBC of 0.7, hemoglobin of 6.6, platelet of 42, pro calcitonin of 23, creatinine of 0.81, total bilirubin of 2.1, and lactate of 3.  Blood cultures were sent.  Urine showed more than 50 RBCs and 50 WBC.  She fits the criteria of sepsis and she was started on vancomycin and cefepime.  She also was noted to have a tender nodule on the left labia. I am asked to see the patient as blood culture is positive for Streptococcus. Patient says she is doing better today. She had fever and chills. She had nausea and vomiting but not anymore. She has no diarrhea She has no abdominal pain She does not have dysuria. She has no shortness of breath or chest pain.  Has baseline cough   .  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  Also has a ventral epidural tumor.  She received palliative radiation therapy to the spine.  And she is currently on treatment with carboplatin, etoposide and Tecentriq.  She also has unexplained hypocalcemia and is getting IV calcium every week. Past Medical History:  Diagnosis Date  . Arthritis   . Cirrhosis ([place])   . Depression   . GERD (gastroesophageal reflux disease)   . Hyperlipidemia   . Hypertension   . Lung cancer ([place])   . Metastatic bone cancer [facility])      Past Surgical History:  Procedure Laterality Date  . APPENDECTOMY  1971  . CHOLECYSTECTOMY  1971  . COLONOSCOPY WITH PROPOFOL N/A 11/15/2016   Procedure: COLONOSCOPY WITH PROPOFOL;  Surgeon: Jonathon Bellows, MD;  Location: [facility] ENDOSCOPY;  Service: Gastroenterology;  Laterality: N/A;  . ENDOBRONCHIAL ULTRASOUND Right 07/30/2018   Procedure: ENDOBRONCHIAL ULTRASOUND;  Surgeon: Laverle Hobby, MD;  Location: [facility] ORS;  Service: Pulmonary;  Laterality: Right;  . ESOPHAGOGASTRODUODENOSCOPY (EGD) WITH PROPOFOL N/A 01/07/2017   Procedure: ESOPHAGOGASTRODUODENOSCOPY (EGD) WITH PROPOFOL;  Surgeon: Jonathon Bellows, MD;  Location: [facility] ENDOSCOPY;  Service: Gastroenterology;  Laterality: N/A;  . LAPAROSCOPY N/A 03/01/2017   Procedure: LAPAROSCOPY DIAGNOSTIC;  Surgeon: Robert Bellow, MD;  Location: [facility] ORS;  Service: General;  Laterality: N/A;  . PORTA CATH INSERTION N/A 08/14/2018   Procedure: PORTA CATH INSERTION;  Surgeon: Algernon Huxley, MD;  Location: [facility] [facility] LAB;  Service: Cardiovascular;  Laterality: N/A;  . TONSILECTOMY, ADENOIDECTOMY, BILATERAL MYRINGOTOMY [place]  . TONSILLECTOMY    . VENTRAL HERNIA REPAIR N/A 03/01/2017   10 x 14 CM Ventralight ST mesh, intraperitoneal location.   . VENTRAL HERNIA REPAIR N/A 03/01/2017   Procedure: HERNIA REPAIR VENTRAL ADULT;  Surgeon: Robert Bellow, MD;  Location: [facility] ORS;  Service: General;  Laterality: N/A;    Social History   Socioeconomic History  . Marital status: Married    Spouse name:  Johnny  . Number of children: Not on file  . Years of education: Not on file  . Highest education level: Not on file  Occupational History  . Not on file  Social Needs  . Financial resource strain: Not hard at all  . Food insecurity    Worry: Never true    Inability: Never true  . Transportation needs    Medical: No    Non-medical: No  Tobacco Use  . Smoking status: Former Smoker    Packs/day: 2.00    Years: 50.00    Pack years:  100.00    Types: Cigarettes, E-cigarettes    Quit date: 02/07/2012    Years since quitting: 6.7  . Smokeless tobacco: Never Used  Substance and Sexual Activity  . Alcohol use: No    Alcohol/week: 0.0 standard drinks  . Drug use: No  . Sexual activity: Yes  Lifestyle  . Physical activity    Days per week: Patient refused    Minutes per session: Patient refused  . Stress: Not at all  Relationships  . Social Herbalist on phone: Patient refused    Gets together: Patient refused    Attends religious service: Patient refused    Active member of club or organization: Patient refused    Attends meetings of clubs or organizations: Patient refused    Relationship status: Patient refused  . Intimate partner violence    Fear of current or ex partner: No    Emotionally abused: No    Physically abused: No    Forced sexual activity: No  Other Topics Concern  . Not on file  Social History Narrative   Married   Retired   Clinical cytogeneticist level of education    No children    1 cup of coffee    Family History  Problem Relation Age of Onset  . Hypertension Mother   . Ovarian cancer Mother 75  . Heart disease Father   . Stroke Father   . Ovarian cancer Sister        ? dx cancer had hyst.  . Breast cancer Neg Hx    Allergies  Allergen Reactions  . Bee Venom Swelling    ? Current Facility-Administered Medications  Medication Dose Route Frequency Provider Last Rate Last Dose  . 0.9 %  sodium chloride infusion   Intravenous Continuous Mayo, Pete Pelt, MD 75 mL/hr at 10/30/18 1630    . acetaminophen (TYLENOL) tablet 650 mg  650 mg Oral Q6H PRN Mayo, Pete Pelt, MD       Or  . acetaminophen (TYLENOL) suppository 650 mg  650 mg Rectal Q6H PRN Mayo, Pete Pelt, MD      . buPROPion (WELLBUTRIN XL) 24 hr tablet 300 mg  300 mg Oral Daily Mayo, Pete Pelt, MD   300 mg at 10/30/18 0916  . cefTRIAXone (ROCEPHIN) 2 g in sodium chloride 0.9 % 100 mL IVPB  2 g Intravenous Q24H Lang Snow, NP 200 mL/hr at 10/30/18 0453 2 g at 10/30/18 0453  . Chlorhexidine Gluconate Cloth 2 % PADS 6 each  6 each Topical Daily Demetrios Loll, MD   6 each at 10/30/18 1525  . feeding supplement (ENSURE ENLIVE) (ENSURE ENLIVE) liquid 237 mL  237 mL Oral BID BM Demetrios Loll, MD      . folic acid (FOLVITE) tablet 1 mg  1 mg Oral Daily Mayo, Pete Pelt, MD   1 mg at 10/30/18 0916  . loperamide (  IMODIUM) capsule 2 mg  2 mg Oral Q2H PRN Mayo, Pete Pelt, MD      . LORazepam (ATIVAN) tablet 0.5 mg  0.5 mg Oral Q6H PRN Mayo, Pete Pelt, MD      . Derrill Memo ON 10/31/2018] multivitamin with minerals tablet 1 tablet  1 tablet Oral Daily Demetrios Loll, MD      . multivitamin-lutein (OCUVITE-LUTEIN) capsule 1 capsule  1 capsule Oral Daily Mayo, Pete Pelt, MD   1 capsule at 10/30/18 0917  . ondansetron (ZOFRAN) tablet 4 mg  4 mg Oral Q6H PRN Mayo, Pete Pelt, MD   4 mg at 10/30/18 1252   Or  . ondansetron (ZOFRAN) injection 4 mg  4 mg Intravenous Q6H PRN Mayo, Pete Pelt, MD      . oxyCODONE (Oxy IR/ROXICODONE) immediate release tablet 5 mg  5 mg Oral Q4H PRN Mayo, Pete Pelt, MD      . pantoprazole (PROTONIX) EC tablet 40 mg  40 mg Oral BID AC Mayo, Pete Pelt, MD   40 mg at 10/30/18 0916  . polyethylene glycol (MIRALAX / GLYCOLAX) packet 17 g  17 g Oral Daily PRN Mayo, Pete Pelt, MD      . rosuvastatin (CRESTOR) tablet 20 mg  20 mg Oral Daily Mayo, Pete Pelt, MD   20 mg at 10/30/18 0916  . silver sulfADIAZINE (SILVADENE) 1 % cream   Topical BID Schuman, Christanna R, MD      . vancomycin (VANCOCIN) 1,500 mg in sodium chloride 0.9 % 500 mL IVPB  1,500 mg Intravenous Q24H Demetrios Loll, MD 250 mL/hr at 10/30/18 1643 1,500 mg at 10/30/18 1643     Abtx:  Anti-infectives (From admission, onward)   Start     Dose/Rate Route Frequency Ordered Stop   10/30/18 1500  vancomycin (VANCOCIN) 1,500 mg in sodium chloride 0.9 % 500 mL IVPB     1,500 mg 250 mL/hr over 120 Minutes Intravenous Every 24 hours 10/30/18 1423      10/30/18 1200  vancomycin (VANCOCIN) 1,500 mg in sodium chloride 0.9 % 500 mL IVPB  Status:  Discontinued     1,500 mg 250 mL/hr over 120 Minutes Intravenous Every 24 hours 10/29/18 1702 10/30/18 0422   10/30/18 0430  cefTRIAXone (ROCEPHIN) 2 g in sodium chloride 0.9 % 100 mL IVPB     2 g 200 mL/hr over 30 Minutes Intravenous Every 24 hours 10/30/18 0422     10/29/18 2000  metroNIDAZOLE (FLAGYL) IVPB 500 mg  Status:  Discontinued     500 mg 100 mL/hr over 60 Minutes Intravenous Every 8 hours 10/29/18 1959 10/30/18 0422   10/29/18 1800  ceFEPIme (MAXIPIME) 2 g in sodium chloride 0.9 % 100 mL IVPB  Status:  Discontinued     2 g 200 mL/hr over 30 Minutes Intravenous Every 8 hours 10/29/18 1702 10/30/18 0422   10/29/18 1045  ceFEPIme (MAXIPIME) 2 g in sodium chloride 0.9 % 100 mL IVPB     2 g 200 mL/hr over 30 Minutes Intravenous  Once 10/29/18 1033 10/29/18 1206   10/29/18 1045  metroNIDAZOLE (FLAGYL) IVPB 500 mg     500 mg 100 mL/hr over 60 Minutes Intravenous  Once 10/29/18 1033 10/29/18 1216   10/29/18 1045  vancomycin (VANCOCIN) IVPB 1000 mg/200 mL premix  Status:  Discontinued     1,000 mg 200 mL/hr over 60 Minutes Intravenous  Once 10/29/18 1033 10/29/18 1034   10/29/18 1045  vancomycin (VANCOCIN) 1,500 mg in sodium chloride 0.9 %  500 mL IVPB     1,500 mg 250 mL/hr over 120 Minutes Intravenous  Once 10/29/18 1034 10/29/18 1424      REVIEW OF SYSTEMS:  Const:  fever,  chills, negative weight loss Eyes: negative diplopia or visual changes, negative eye pain ENT: negative coryza, negative sore throat Resp: negative cough, hemoptysis, dyspnea Cards: negative for chest pain, palpitations, lower extremity edema GU: negative for frequency, dysuria and hematuria GI: Negative for abdominal pain, one episode of diarrhea no bleeding, constipation Skin: negative for rash and pruritus Heme: negative for easy bruising and gum/nose bleeding MS: negative for myalgias, arthralgias, back pain  and muscle weakness Neurolo:negative for headaches, dizziness, vertigo, memory problems  Psych: negative for feelings of anxiety, depression  Endocrine: No polyuria or polydipsia Allergy/Immunology- negative for any medication or food allergies ? : Objective:  VITALS:  BP (!) 116/56 (BP Location: Left Arm)   Pulse (!) 106   Temp (!) 101.9 F (38.8 C)   Resp 19   Ht 5\' 5"  (1.651 m)   Wt 73.5 kg   SpO2 100%   BMI 26.96 kg/m  PHYSICAL EXAM:  General: Alert, cooperative, no distress, appears stated age.  Head: Normocephalic, without obvious abnormality, atraumatic. Eyes: Conjunctivae clear, anicteric sclerae. Pupils are equal ENT Nares normal. No drainage or sinus tenderness. Poor dentition Neck: Supple, symmetrical, no adenopathy, thyroid: non tender For chest wall no carotid bruit and no JVD. Back: No CVA tenderness. Lungs: Bilateral air entry Heart: Regular rate and rhythm, no murmur, rub or gallop. Abdomen: Soft, non-tender,not distended. Bowel sounds normal. No masses Left labia tender nodule on the exterior Extremities: atraumatic, no cyanosis. No edema. No clubbing Skin: No rashes or lesions. Or bruising Lymph: Cervical, supraclavicular normal. Neurologic: Grossly non-focal Pertinent Labs Lab Results CBC    Component Value Date/Time   WBC 0.4 (LL) 10/30/2018 0548   RBC 1.77 (L) 10/30/2018 0548   HGB 5.1 (L) 10/30/2018 0548   HCT 15.6 (L) 10/30/2018 0548   PLT 20 (LL) 10/30/2018 0548   MCV 88.1 10/30/2018 0548   MCH 28.8 10/30/2018 0548   MCHC 32.7 10/30/2018 0548   RDW 20.8 (H) 10/30/2018 0548   LYMPHSABS 0.0 (L) 10/29/2018 1056   MONOABS 0.0 (L) 10/29/2018 1056   EOSABS 0.0 10/29/2018 1056   BASOSABS 0.0 10/29/2018 1056    CMP Latest Ref Rng & Units 10/30/2018 10/29/2018 10/29/2018  Glucose 70 - 99 mg/dL 92 - 94  BUN 8 - 23 mg/dL 15 - 18  Creatinine 0.44 - 1.00 mg/dL 0.69 - 0.71  Sodium 135 - 145 mmol/L 136 - 138  Potassium 3.5 - 5.1 mmol/L 3.4(L) - 3.5   Chloride 98 - 111 mmol/L 103 - 109  CO2 22 - 32 mmol/L 20(L) - 20(L)  Calcium 8.9 - 10.3 mg/dL 6.6(L) - 7.8(L)  Total Protein 6.5 - 8.1 g/dL 5.4(L) - 6.4(L)  Total Bilirubin 0.3 - 1.2 mg/dL 1.0 1.5(H) 2.3(H)  Alkaline Phos 38 - 126 U/L 120 - 158(H)  AST 15 - 41 U/L 25 - 60(H)  ALT 0 - 44 U/L 31 - 47(H)      Microbiology: Recent Results (from the past 240 hour(s))  Respiratory Panel by PCR     Status: None   Collection Time: 10/29/18  6:43 AM   Specimen: Nasopharyngeal Swab; Respiratory  Result Value Ref Range Status   Adenovirus NOT DETECTED NOT DETECTED Final   Coronavirus 229E NOT DETECTED NOT DETECTED Final    Comment: (NOTE) The Coronavirus on  the Respiratory Panel, DOES NOT test for the novel  Coronavirus (2019 nCoV)    Coronavirus HKU1 NOT DETECTED NOT DETECTED Final   Coronavirus NL63 NOT DETECTED NOT DETECTED Final   Coronavirus OC43 NOT DETECTED NOT DETECTED Final   Metapneumovirus NOT DETECTED NOT DETECTED Final   Rhinovirus / Enterovirus NOT DETECTED NOT DETECTED Final   Influenza A NOT DETECTED NOT DETECTED Final   Influenza B NOT DETECTED NOT DETECTED Final   Parainfluenza Virus 1 NOT DETECTED NOT DETECTED Final   Parainfluenza Virus 2 NOT DETECTED NOT DETECTED Final   Parainfluenza Virus 3 NOT DETECTED NOT DETECTED Final   Parainfluenza Virus 4 NOT DETECTED NOT DETECTED Final   Respiratory Syncytial Virus NOT DETECTED NOT DETECTED Final   Bordetella pertussis NOT DETECTED NOT DETECTED Final   Chlamydophila pneumoniae NOT DETECTED NOT DETECTED Final   Mycoplasma pneumoniae NOT DETECTED NOT DETECTED Final    Comment: Performed at [facility], [place] [address] [place], [place] [address]  Blood Culture (routine x 2)     Status: None (Preliminary result)   Collection Time: 10/29/18 10:56 AM   Specimen: BLOOD  Result Value Ref Range Status   Specimen Description BLOOD BLOOD LEFT FOREARM  Final   Special Requests   Final    BOTTLES DRAWN AEROBIC AND  ANAEROBIC Blood Culture adequate volume   Culture  Setup Time   Final    Organism ID to follow GRAM POSITIVE COCCI IN BOTH AEROBIC AND ANAEROBIC BOTTLES CRITICAL RESULT CALLED TO, READ BACK BY AND VERIFIED WITH: DAVID BESANTI AT 0207 ON 10/30/2018 SNG Performed at [facility], [address], [place], [place] [address]    Culture GRAM POSITIVE COCCI  Final   Report Status PENDING  Incomplete  Blood Culture ID Panel (Reflexed)     Status: Abnormal   Collection Time: 10/29/18 10:56 AM  Result Value Ref Range Status   Enterococcus species NOT DETECTED NOT DETECTED Final   Listeria monocytogenes NOT DETECTED NOT DETECTED Final   Staphylococcus species NOT DETECTED NOT DETECTED Final   Staphylococcus aureus (BCID) NOT DETECTED NOT DETECTED Final   Streptococcus species DETECTED (A) NOT DETECTED Final    Comment: Not Enterococcus species, Streptococcus agalactiae, Streptococcus pyogenes, or Streptococcus pneumoniae. CRITICAL RESULT CALLED TO, READ BACK BY AND VERIFIED WITH: DAVID BESANTI AT 0207 ON 10/30/2018 SNG    Streptococcus agalactiae NOT DETECTED NOT DETECTED Final   Streptococcus pneumoniae NOT DETECTED NOT DETECTED Final   Streptococcus pyogenes NOT DETECTED NOT DETECTED Final   Acinetobacter baumannii NOT DETECTED NOT DETECTED Final   Enterobacteriaceae species NOT DETECTED NOT DETECTED Final   Enterobacter cloacae complex NOT DETECTED NOT DETECTED Final   Escherichia coli NOT DETECTED NOT DETECTED Final   Klebsiella oxytoca NOT DETECTED NOT DETECTED Final   Klebsiella pneumoniae NOT DETECTED NOT DETECTED Final   Proteus species NOT DETECTED NOT DETECTED Final   Serratia marcescens NOT DETECTED NOT DETECTED Final   Haemophilus influenzae NOT DETECTED NOT DETECTED Final   Neisseria meningitidis NOT DETECTED NOT DETECTED Final   Pseudomonas aeruginosa NOT DETECTED NOT DETECTED Final   Candida albicans NOT DETECTED NOT DETECTED Final   Candida glabrata NOT DETECTED NOT  DETECTED Final   Candida krusei NOT DETECTED NOT DETECTED Final   Candida parapsilosis NOT DETECTED NOT DETECTED Final   Candida tropicalis NOT DETECTED NOT DETECTED Final    Comment: Performed at [facility], [address], [place],  [address]  Blood Culture (routine x 2)  Status: None (Preliminary result)   Collection Time: 10/29/18 10:59 AM   Specimen: BLOOD  Result Value Ref Range Status   Specimen Description BLOOD LEFT ANTECUBITAL  Final   Special Requests   Final    BOTTLES DRAWN AEROBIC AND ANAEROBIC Blood Culture adequate volume   Culture  Setup Time   Final    GRAM POSITIVE COCCI IN BOTH AEROBIC AND ANAEROBIC BOTTLES CRITICAL VALUE NOTED.  VALUE IS CONSISTENT WITH PREVIOUSLY REPORTED AND CALLED VALUE. Performed at [facility], [address], [place], [place] [address]    Culture [facility] POSITIVE COCCI  Final   Report Status PENDING  Incomplete  SARS Coronavirus 2 [facility] order, Performed in [facility] hospital lab) Nasopharyngeal Nasopharyngeal Swab     Status: None   Collection Time: 10/29/18 12:08 PM   Specimen: Nasopharyngeal Swab  Result Value Ref Range Status   SARS Coronavirus 2 NEGATIVE NEGATIVE Final    Comment: (NOTE) If result is NEGATIVE SARS-CoV-2 target nucleic acids are NOT DETECTED. The SARS-CoV-2 RNA is generally detectable in upper and lower  respiratory specimens during the acute phase of infection. The lowest  concentration of SARS-CoV-2 viral copies this assay can detect is 250  copies / mL. A negative result does not preclude SARS-CoV-2 infection  and should not be used as the sole basis for treatment or other  patient management decisions.  A negative result may occur with  improper specimen collection / handling, submission of specimen other  than nasopharyngeal swab, presence of viral mutation(s) within the  areas targeted by this assay, and inadequate number of viral copies  (<250 copies / mL). A negative result  must be combined with clinical  observations, patient history, and epidemiological information. If result is POSITIVE SARS-CoV-2 target nucleic acids are DETECTED. The SARS-CoV-2 RNA is generally detectable in upper and lower  respiratory specimens dur ing the acute phase of infection.  Positive  results are indicative of active infection with SARS-CoV-2.  Clinical  correlation with patient history and other diagnostic information is  necessary to determine patient infection status.  Positive results do  not rule out bacterial infection or co-infection with other viruses. If result is PRESUMPTIVE POSTIVE SARS-CoV-2 nucleic acids MAY BE PRESENT.   A presumptive positive result was obtained on the submitted specimen  and confirmed on repeat testing.  While 2019 novel coronavirus  (SARS-CoV-2) nucleic acids may be present in the submitted sample  additional confirmatory testing may be necessary for epidemiological  and / or clinical management purposes  to differentiate between  SARS-CoV-2 and other Sarbecovirus currently known to infect humans.  If clinically indicated additional testing with an alternate test  methodology [phone]) is advised. The SARS-CoV-2 RNA is generally  detectable in upper and lower respiratory sp ecimens during the acute  phase of infection. The expected result is Negative. Fact Sheet for Patients:  StrictlyIdeas.no Fact Sheet for Healthcare Providers: BankingDealers.co.za This test is not yet approved or cleared by the [place] FDA and has been authorized for detection and/or diagnosis of SARS-CoV-2 by FDA under an Emergency Use Authorization (EUA).  This EUA will remain in effect (meaning this test can be used) for the duration of the COVID-19 declaration under Section 564(b)(1) of the Act, 21 U.S.C. section 360bbb-3(b)(1), unless the authorization is terminated or revoked sooner. Performed at [facility], [address] [place], [place] [address]     IMAGING RESULTS: Chest x-ray no obvious infiltrate I have personally reviewed the films ?  Impression/Recommendation ? ?Streptococcus bacteremia 4 out of 4.  Source could be oral cavity or GI tract.  Or genital area.  She does have a painful nodule on the left labial area. We will await speciation.  We will get 2D echo. Currently on Vanco and ceftriaxone.  Will DC vancomycin.  Currently there is no need to remove the port.  Poor dentition  Left labial tender nodule an evolving abscess.  Observe  Pancytopenia following chemotherapy.  Metastatic lung carcinoma on palliative chemotherapy status post palliative radiation to the spine.  Hypercalcemia get supplement regularly ? ___________________________________________________ Discussed the management with the patient. Note:  This document was prepared using Dragon voice recognition software and may include unintentional dictation errors.

## 2018-10-30 NOTE — Progress Notes (Signed)
Initial Nutrition Assessment  DOCUMENTATION CODES:   Not applicable  INTERVENTION:   Ensure Enlive po BID, each supplement provides 350 kcal and 20 grams of protein  MVI daily   Liberalize diet   NUTRITION DIAGNOSIS:   Increased nutrient needs related to cancer and cancer related treatments as evidenced by increased estimated needs.  GOAL:   Patient will meet greater than or equal to 90% of their needs  MONITOR:   PO intake, Supplement acceptance, Labs, Weight trends, Skin, I & O's  REASON FOR ASSESSMENT:   Malnutrition Screening Tool    ASSESSMENT:   68 y.o. female with a known history of cirrhosis, hypertension, hyperlipidemia, lung cancer with metastasis to bone, liver cirrhosis who presented to the ED or vomiting and found to have UTI with sepsis  RD working remotely.  RD familiar with this patent from multiple previous admits. Pt with intermittent poor appetite and oral intake but also eats well at times. Pt reports poor appetite and oral intake for 1 day pta r/t nausea and vomiting. Pt met with RD at cancer center on 9/17 and reported appetite and oral intake was improving and pt's had had some weight gain. Pt currently eating ~60% of meals in hospital. RD will add supplements and MVI to help pt meet her estimated needs. Pt does like Ensure/Boost supplements. RD will also liberalize pt's diet.   Per chart, pt with 37lb(19%) weight loss over the past year and 13lb(7%) weight loss over the past 2 months; this is not significant given the time frames.   Medications reviewed and include: ocuvite, protonix, NaCl @75ml /hr, ceftriaxone, Mg sulfate   Labs reviewed: K 3.4(L), Mg 1.3(L) Wbc- 0.4(L), Hgb 5.1(L), Hct 15.6(L)  Unable to complete Nutrition-Focused physical exam at this time.   Diet Order:   Diet Order            Diet regular Room service appropriate? Yes; Fluid consistency: Thin  Diet effective now             EDUCATION NEEDS:   Education needs have  been addressed  Skin:  Skin Assessment: Reviewed RN Assessment(ecchymosis)  Last BM:  9/23  Height:   Ht Readings from Last 1 Encounters:  10/29/18 5' 5"  (1.651 m)    Weight:   Wt Readings from Last 1 Encounters:  10/29/18 73.5 kg    Ideal Body Weight:  56.8 kg  BMI:  Body mass index is 26.96 kg/m.  Estimated Nutritional Needs:   Kcal:  1700-1900kcal/day  Protein:  85-95g/day  Fluid:  >1.5L/day  Koleen Distance MS, RD, LDN Pager #- [phone] Office#- [phone] After Hours Pager: [phone]

## 2018-10-31 ENCOUNTER — Other Ambulatory Visit: Payer: PPO

## 2018-10-31 DIAGNOSIS — Z79899 Other long term (current) drug therapy: Secondary | ICD-10-CM

## 2018-10-31 DIAGNOSIS — R5081 Fever presenting with conditions classified elsewhere: Secondary | ICD-10-CM

## 2018-10-31 DIAGNOSIS — D709 Neutropenia, unspecified: Secondary | ICD-10-CM

## 2018-10-31 LAB — BASIC METABOLIC PANEL
Anion gap: 5 (ref 5–15)
BUN: 9 mg/dL (ref 8–23)
CO2: 22 mmol/L (ref 22–32)
Calcium: 6.8 mg/dL — ABNORMAL LOW (ref 8.9–10.3)
Chloride: 109 mmol/L (ref 98–111)
Creatinine, Ser: 0.58 mg/dL (ref 0.44–1.00)
GFR calc Af Amer: >60 mL/min (ref >60)
GFR calc non Af Amer: >60 mL/min (ref >60)
Glucose, Bld: 103 mg/dL — ABNORMAL HIGH (ref 70–99)
Potassium: 3.6 mmol/L (ref 3.5–5.1)
Sodium: 136 mmol/L (ref 135–145)

## 2018-10-31 LAB — HAPTOGLOBIN: Haptoglobin: 279 mg/dL (ref 37–355)

## 2018-10-31 LAB — CBC
HCT: 18.7 % — ABNORMAL LOW (ref 36.0–46.0)
Hemoglobin: 6.2 g/dL — ABNORMAL LOW (ref 12.0–15.0)
MCH: 28.8 pg (ref 26.0–34.0)
MCHC: 33.2 g/dL (ref 30.0–36.0)
MCV: 87 fL (ref 80.0–100.0)
Platelets: 16 10*3/uL — CL (ref 150–400)
RBC: 2.15 MIL/uL — ABNORMAL LOW (ref 3.87–5.11)
RDW: 19.9 % — ABNORMAL HIGH (ref 11.5–15.5)
WBC: 0.2 10*3/uL — CL (ref 4.0–10.5)
nRBC: 0 % (ref 0.0–0.2)

## 2018-10-31 LAB — PREPARE RBC (CROSSMATCH)

## 2018-10-31 LAB — MAGNESIUM: Magnesium: 2.1 mg/dL (ref 1.7–2.4)

## 2018-10-31 LAB — HIV ANTIBODY (ROUTINE TESTING W REFLEX): HIV Screen 4th Generation wRfx: NONREACTIVE

## 2018-10-31 MED ORDER — CLINDAMYCIN PHOSPHATE 600 MG/50ML IV SOLN
600.0000 mg | Freq: Three times a day (TID) | INTRAVENOUS | Status: DC
  Administered 2018-10-31 – 2018-11-01 (×2): 600 mg via INTRAVENOUS
  Filled 2018-10-31 (×5): qty 50

## 2018-10-31 MED ORDER — SODIUM CHLORIDE 0.9% IV SOLUTION
Freq: Once | INTRAVENOUS | Status: AC
  Administered 2018-10-31: 14:00:00 via INTRAVENOUS

## 2018-10-31 MED ORDER — ACETAMINOPHEN 325 MG PO TABS
650.0000 mg | ORAL_TABLET | Freq: Once | ORAL | Status: AC
  Administered 2018-10-31: 11:00:00 650 mg via ORAL

## 2018-10-31 NOTE — Progress Notes (Signed)
ID Pt says she feels better Last temp yesterday No nausea or vomiting  Patient Vitals for the past 24 hrs:  BP Temp Temp src Pulse Resp SpO2  10/31/18 1656 118/60 98.6 F (37 C) Oral 90 18 96 %  10/31/18 1411 111/64 98.3 F (36.8 C) Oral 98 18 96 %  10/31/18 1346 107/63 98.2 F (36.8 C) - 91 16 99 %  10/31/18 1000 118/63 98.4 F (36.9 C) Oral 98 18 98 %  10/31/18 0549 (!) 113/59 98.6 F (37 C) - (!) 102 16 96 %  10/30/18 2147 - 98.9 F (37.2 C) Oral - - -  10/30/18 1932 (!) 116/56 (!) 101.9 F (38.8 C) - (!) 106 19 100 %  10/30/18 1859 - 100.1 F (37.8 C) Oral - - -   Chest CTA HS s1s2 abd soft  Left labial /perineal area- ulcerating nodule with surrounding erythema    CBC Latest Ref Rng & Units 10/31/2018 10/30/2018 10/29/2018  WBC 4.0 - 10.5 K/uL 0.2(LL) 0.4(LL) 0.8(LL)  Hemoglobin 12.0 - 15.0 g/dL 6.2(L) 5.1(L) 6.2(L)  Hematocrit 36.0 - 46.0 % 18.7(L) 15.6(L) 19.6(L)  Platelets 150 - 400 K/uL 16(LL) 20(LL) 28(LL)    CMP Latest Ref Rng & Units 10/31/2018 10/30/2018 10/29/2018  Glucose 70 - 99 mg/dL 103(H) 92 -  BUN 8 - 23 mg/dL 9 15 -  Creatinine 0.44 - 1.00 mg/dL 0.58 0.69 -  Sodium 135 - 145 mmol/L 136 136 -  Potassium 3.5 - 5.1 mmol/L 3.6 3.4(L) -  Chloride 98 - 111 mmol/L 109 103 -  CO2 22 - 32 mmol/L 22 20(L) -  Calcium 8.9 - 10.3 mg/dL 6.8(L) 6.6(L) -  Total Protein 6.5 - 8.1 g/dL - 5.4(L) -  Total Bilirubin 0.3 - 1.2 mg/dL - 1.0 1.5(H)  Alkaline Phos 38 - 126 U/L - 120 -  AST 15 - 41 U/L - 25 -  ALT 0 - 44 U/L - 31 -    Impression/recommendation Skin and soft tissue infection over the perineum/left labial area- send culture from the wound-  On ceftriaxone- will add clindamycin  Febrile neutropenia  Strep bacteremia- source could be from the soft tissue infection VS mucositis from chemo On ceftriaxone   Pancytopenia - worse today Chemo related  Metastatic lung carcinoma on palliative chemo Carboplatin/etoposide and anti PDl1 monoclonal antibody  Hypocalcemia -gets calcium infusion  Discussed the management with patient and her nurse

## 2018-10-31 NOTE — Progress Notes (Signed)
[facility] [place] NAME: Anne [name]    MR#:  [id number]  DATE OF BIRTH:  1950/03/07  SUBJECTIVE:  CHIEF COMPLAINT:   Chief Complaint  Patient presents with  . Emesis   The patient has no complaints. REVIEW OF SYSTEMS:  Review of Systems  Constitutional: Negative for chills, fever and malaise/fatigue.  HENT: Negative for sore throat.   Eyes: Negative for blurred vision and double vision.  Respiratory: Negative for cough, hemoptysis, shortness of breath, wheezing and stridor.   Cardiovascular: Negative for chest pain, palpitations, orthopnea and leg swelling.  Gastrointestinal: Negative for abdominal pain, blood in stool, diarrhea, melena, nausea and vomiting.  Genitourinary: Negative for dysuria, flank pain and hematuria.  Musculoskeletal: Negative for back pain and joint pain.  Skin: Negative for rash.  Neurological: Negative for dizziness, sensory change, focal weakness, seizures, loss of consciousness, weakness and headaches.  Endo/Heme/Allergies: Negative for polydipsia.  Psychiatric/Behavioral: Negative for depression. The patient is not nervous/anxious.     DRUG ALLERGIES:   Allergies  Allergen Reactions  . Bee Venom Swelling   VITALS:  Blood pressure 118/63, pulse 98, temperature 98.4 F (36.9 C), temperature source Oral, resp. rate 18, height 5\' 5"  (1.651 m), weight 73.5 kg, SpO2 98 %. PHYSICAL EXAMINATION:  Physical Exam Constitutional:      General: She is not in acute distress.    Appearance: Normal appearance.  HENT:     Head: Normocephalic.     Mouth/Throat:     Mouth: Mucous membranes are moist.  Eyes:     General: No scleral icterus.    Conjunctiva/sclera: Conjunctivae normal.     Pupils: Pupils are equal, round, and reactive to light.  Neck:     Musculoskeletal: Normal range of motion and neck supple.     Vascular: No JVD.     Trachea: No tracheal deviation.  Cardiovascular:     Rate and Rhythm:  Normal rate and regular rhythm.     Heart sounds: Normal heart sounds. No murmur. No gallop.   Pulmonary:     Effort: Pulmonary effort is normal. No respiratory distress.     Breath sounds: Normal breath sounds. No wheezing or rales.  Abdominal:     General: Bowel sounds are normal. There is no distension.     Palpations: Abdomen is soft.     Tenderness: There is no abdominal tenderness. There is no rebound.  Musculoskeletal: Normal range of motion.        General: No tenderness.     Right lower leg: No edema.     Left lower leg: No edema.  Skin:    Findings: No erythema or rash.  Neurological:     General: No focal deficit present.     Mental Status: She is alert and oriented to person, place, and time.     Cranial Nerves: No cranial nerve deficit.  Psychiatric:        Mood and Affect: Mood normal.    LABORATORY PANEL:  Female CBC Recent Labs  Lab 10/31/18 0411  WBC 0.2*  HGB 6.2*  HCT 18.7*  PLT 16*   ------------------------------------------------------------------------------------------------------------------ Chemistries  Recent Labs  Lab 10/30/18 0548 10/31/18 0411  NA 136 136  K 3.4* 3.6  CL 103 109  CO2 20* 22  GLUCOSE 92 103*  BUN 15 9  CREATININE 0.69 0.58  CALCIUM 6.6* 6.8*  MG 1.3* 2.1  AST 25  --   ALT 31  --  ALKPHOS 120  --   BILITOT 1.0  --    RADIOLOGY:  No results found. ASSESSMENT AND PLAN:   Sepsis- possibly due to UTI (although patient denies any urinary symptoms) versus left labial abscess, gram-positive cocci bacteremia. -Continue Rocephin and vancomycin pharmacy to dose. -Follow-up blood and urine cultures -CXR is is unremarkable. RUQ [place]: Post cholecystectomy. OB/GYN physician recommend twice daily application of silvadene cream. Continue antibiotics until resolution Per Dr. Delaine Lame, continue Rocephin, discontinue vancomycin, echocardiograph.  Lactic acidosis.  Improved.  Pancytopenia- likely due to chemotherapy -1 unit  PRBC given.  Worsening neutropenia.  The patient already received Neulasta as outpatient. Hemoglobin decreased to 5.1. Gien 1 more unit irradiated PRBC transfusion, Hb up to 6.2, will give 1 more unit irradiated PRBC transfusion today, follow-up CBC in a.m.   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 Janese Banks as an outpatient -Oncology consult  Elevated liver enzymes / hyperbilirubinemia- may be due to sepsis, improved.  Hyperlipidemia-stable -Continue home Crestor  Depression-stable -Continue home Wellbutrin  GERD- stable -Continue home PPI  Hypokalemia.  Improved with potassium supplement. Hypomagnesemia.  Improved with IV magnesium.  I called the patient daughter, nobody answered the phone. All the records are reviewed and case discussed with Care Management/Social Worker. Management plans discussed with the patient, family and they are in agreement.  CODE STATUS: Full Code  TOTAL TIME TAKING CARE OF THIS PATIENT: 36 minutes.   More than 50% of the time was spent in counseling/coordination of care: YES  POSSIBLE D/C IN 3 DAYS, DEPENDING ON CLINICAL CONDITION.   Demetrios Loll M.D on 10/31/2018 at 1:02 PM  Between 7am to 6pm - Pager - [phone]  After 6pm go to [email or web address] - [facility]

## 2018-10-31 NOTE — Plan of Care (Signed)

## 2018-10-31 NOTE — Progress Notes (Signed)
Hematology/Oncology Consult note [facility]  [phone] [phone]  Patient Care Team: Leone Haven, MD as PCP - General (Family Medicine) Leone Haven, MD as Consulting Physician (Family Medicine) Bary Castilla, Forest Gleason, MD (General Surgery) Telford Nab, RN as Registered Nurse   Name of the patient: Anne [name]  [id number]  01-09-1951   Date of visit: 10/31/2018   Interval history- feels well. Denies any complaints  ECOG PS- 1 Pain scale- 0   Review of systems- Review of Systems  Constitutional: Positive for malaise/fatigue. Negative for chills, fever and weight loss.  HENT: Negative for congestion, ear discharge and nosebleeds.   Eyes: Negative for blurred vision.  Respiratory: Negative for cough, hemoptysis, sputum production, shortness of breath and wheezing.   Cardiovascular: Negative for chest pain, palpitations, orthopnea and claudication.  Gastrointestinal: Negative for abdominal pain, blood in stool, constipation, diarrhea, heartburn, melena, nausea and vomiting.  Genitourinary: Negative for dysuria, flank pain, frequency, hematuria and urgency.  Musculoskeletal: Negative for back pain, joint pain and myalgias.  Skin: Negative for rash.  Neurological: Negative for dizziness, tingling, focal weakness, seizures, weakness and headaches.  Endo/Heme/Allergies: Does not bruise/bleed easily.  Psychiatric/Behavioral: Negative for depression and suicidal ideas. The patient does not have insomnia.       Allergies  Allergen Reactions  . Bee Venom Swelling     Past Medical History:  Diagnosis Date  . Arthritis   . Cirrhosis ([place])   . Depression   . GERD (gastroesophageal reflux disease)   . Hyperlipidemia   . Hypertension   . Lung cancer ([place])   . Metastatic bone cancer [facility])      Past Surgical History:  Procedure Laterality Date  . APPENDECTOMY  1971  . CHOLECYSTECTOMY  1971  . COLONOSCOPY WITH  PROPOFOL N/A 11/15/2016   Procedure: COLONOSCOPY WITH PROPOFOL;  Surgeon: Jonathon Bellows, MD;  Location: [facility] ENDOSCOPY;  Service: Gastroenterology;  Laterality: N/A;  . ENDOBRONCHIAL ULTRASOUND Right 07/30/2018   Procedure: ENDOBRONCHIAL ULTRASOUND;  Surgeon: Laverle Hobby, MD;  Location: [facility] ORS;  Service: Pulmonary;  Laterality: Right;  . ESOPHAGOGASTRODUODENOSCOPY (EGD) WITH PROPOFOL N/A 01/07/2017   Procedure: ESOPHAGOGASTRODUODENOSCOPY (EGD) WITH PROPOFOL;  Surgeon: Jonathon Bellows, MD;  [facility] END[facility];  Service: Gastroenterology;  Laterality: N/A;  . LAPAROSCOPY N/A 03/01/2017   Procedure: LAPAROSCOPY DIAGNOSTIC;  Surgeon: Robert Bellow, MD;  Location: [facility] ORS;  Service: General;  Laterality: N/A;  . PORTA CATH INSERTION N/A 08/14/2018   Procedure: PORTA CATH INSERTION;  Surgeon: Algernon Huxley, MD;  Location: [place] CV LAB;  Service: Cardiovascular;  Laterality: N/A;  . TONSILECTOMY, ADENOIDECTOMY, BILATERAL MYRINGOTOMY [place]  . TONSILLECTOMY    . VENTRAL HERNIA REPAIR N/A 03/01/2017   10 x 14 CM Ventralight ST mesh, intraperitoneal location.   . VENTRAL HERNIA REPAIR N/A 03/01/2017   Procedure: HERNIA REPAIR VENTRAL ADULT;  Surgeon: Robert Bellow, MD;  Location: [facility] ORS;  Service: General;  Laterality: N/A;    Social History   Socioeconomic History  . Marital status: Married    Spouse name: [name]  . Number of children: Not on file  . Years of education: Not on file  . Highest education level: Not on file  Occupational History  . Not on file  Social Needs  . Financial resource strain: Not hard at all  . Food insecurity    Worry: Never true    Inability: Never true  . Transportation needs    Medical: No  Non-medical: No  Tobacco Use  . Smoking status: Former Smoker    Packs/day: 2.00    Years: 50.00    Pack years: 100.00    Types: Cigarettes, E-cigarettes    Quit date: 02/07/2012    Years since quitting: 6.7  . Smokeless tobacco:  Never Used  Substance and Sexual Activity  . Alcohol use: No    Alcohol/week: 0.0 standard drinks  . Drug use: No  . Sexual activity: Yes  Lifestyle  . Physical activity    Days per week: Patient refused    Minutes per session: Patient refused  . Stress: Not at all  Relationships  . Social Herbalist on phone: Patient refused    Gets together: Patient refused    Attends religious service: Patient refused    Active member of club or organization: Patient refused    Attends meetings of clubs or organizations: Patient refused    Relationship status: Patient refused  . Intimate partner violence    Fear of current or ex partner: No    Emotionally abused: No    Physically abused: No    Forced sexual activity: No  Other Topics Concern  . Not on file  Social History Narrative   Married   Retired   Clinical cytogeneticist level of education    No children    1 cup of coffee    Family History  Problem Relation Age of Onset  . Hypertension Mother   . Ovarian cancer Mother 89  . Heart disease Father   . Stroke Father   . Ovarian cancer Sister        ? dx cancer had hyst.  . Breast cancer Neg Hx      Current Facility-Administered Medications:  .  0.9 %  sodium chloride infusion, , Intravenous, Continuous, Mayo, Pete Pelt, MD, Last Rate: 75 mL/hr at 10/30/18 1630 .  acetaminophen (TYLENOL) tablet 650 mg, 650 mg, Oral, Q6H PRN, 650 mg at 10/30/18 2020 **OR** acetaminophen (TYLENOL) suppository 650 mg, 650 mg, Rectal, Q6H PRN, Mayo, Pete Pelt, MD .  buPROPion (WELLBUTRIN XL) 24 hr tablet 300 mg, 300 mg, Oral, Daily, Mayo, Pete Pelt, MD, 300 mg at 10/31/18 0945 .  cefTRIAXone (ROCEPHIN) 2 g in sodium chloride 0.9 % 100 mL IVPB, 2 g, Intravenous, Q24H, [place], Bing Neighbors, NP, Last Rate: 200 mL/hr at 10/31/18 0420, 2 g at 10/31/18 0420 .  Chlorhexidine Gluconate Cloth 2 % PADS 6 each, 6 each, Topical, Daily, Demetrios Loll, MD, 6 each at 10/30/18 1525 .  feeding supplement  (ENSURE ENLIVE) (ENSURE ENLIVE) liquid 237 mL, 237 mL, Oral, BID BM, Demetrios Loll, MD, 237 mL at 10/31/18 0945 .  folic acid (FOLVITE) tablet 1 mg, 1 mg, Oral, Daily, Mayo, Pete Pelt, MD, 1 mg at 10/31/18 0944 .  loperamide (IMODIUM) capsule 2 mg, 2 mg, Oral, Q2H PRN, Mayo, Pete Pelt, MD .  LORazepam (ATIVAN) tablet 0.5 mg, 0.5 mg, Oral, Q6H PRN, Mayo, Pete Pelt, MD .  multivitamin with minerals tablet 1 tablet, 1 tablet, Oral, Daily, Demetrios Loll, MD, 1 tablet at 10/31/18 0945 .  multivitamin-lutein (OCUVITE-LUTEIN) capsule 1 capsule, 1 capsule, Oral, Daily, Mayo, Pete Pelt, MD, 1 capsule at 10/31/18 0944 .  ondansetron (ZOFRAN) tablet 4 mg, 4 mg, Oral, Q6H PRN, 4 mg at 10/30/18 1252 **OR** ondansetron (ZOFRAN) injection 4 mg, 4 mg, Intravenous, Q6H PRN, Mayo, Pete Pelt, MD .  oxyCODONE (Oxy IR/ROXICODONE) immediate release tablet 5 mg, 5 mg, Oral, Q4H  PRN, Mayo, Pete Pelt, MD .  pantoprazole (PROTONIX) EC tablet 40 mg, 40 mg, Oral, BID AC, Mayo, Pete Pelt, MD, 40 mg at 10/31/18 0944 .  polyethylene glycol (MIRALAX / GLYCOLAX) packet 17 g, 17 g, Oral, Daily PRN, Mayo, Pete Pelt, MD .  rosuvastatin (CRESTOR) tablet 20 mg, 20 mg, Oral, Daily, Mayo, Pete Pelt, MD, 20 mg at 10/31/18 0945 .  silver sulfADIAZINE (SILVADENE) 1 % cream, , Topical, BID, Homero Fellers, MD  Physical exam:  Vitals:   10/31/18 0549 10/31/18 1000 10/31/18 1346 10/31/18 1411  BP: (!) 113/59 118/63 107/63 111/64  Pulse: (!) 102 98 91 98  Resp: 16 18 16 18   Temp: 98.6 F (37 C) 98.4 F (36.9 C) 98.2 F (36.8 C) 98.3 F (36.8 C)  TempSrc:  Oral  Oral  SpO2: 96% 98% 99% 96%  Weight:      Height:       Physical Exam Constitutional:      General: She is not in acute distress. HENT:     Head: Normocephalic and atraumatic.  Eyes:     Pupils: Pupils are equal, round, and reactive to light.  Neck:     Musculoskeletal: Normal range of motion.  Cardiovascular:     Rate and Rhythm: Normal rate and regular rhythm.      Heart sounds: Normal heart sounds.  Pulmonary:     Effort: Pulmonary effort is normal.     Breath sounds: Normal breath sounds.  Abdominal:     General: Bowel sounds are normal.     Palpations: Abdomen is soft.  Skin:    General: Skin is warm and dry.  Neurological:     Mental Status: She is alert and oriented to person, place, and time.      CMP Latest Ref Rng & Units 10/31/2018  Glucose 70 - 99 mg/dL 103(H)  BUN 8 - 23 mg/dL 9  Creatinine 0.44 - 1.00 mg/dL 0.58  Sodium 135 - 145 mmol/L 136  Potassium 3.5 - 5.1 mmol/L 3.6  Chloride 98 - 111 mmol/L 109  CO2 22 - 32 mmol/L 22  Calcium 8.9 - 10.3 mg/dL 6.8(L)  Total Protein 6.5 - 8.1 g/dL -  Total Bilirubin 0.3 - 1.2 mg/dL -  Alkaline Phos 38 - 126 U/L -  AST 15 - 41 U/L -  ALT 0 - 44 U/L -   CBC Latest Ref Rng & Units 10/31/2018  WBC 4.0 - 10.5 K/uL 0.2(LL)  Hemoglobin 12.0 - 15.0 g/dL 6.2(L)  Hematocrit 36.0 - 46.0 % 18.7(L)  Platelets 150 - 400 K/uL 16(LL)    @IMAGES @  Ct Head Wo Contrast  Result Date: 10/15/2018 CLINICAL DATA:  Altered level of consciousness EXAM: CT HEAD WITHOUT CONTRAST TECHNIQUE: Contiguous axial images were obtained from the base of the skull through the vertex without intravenous contrast. COMPARISON:  None. FINDINGS: Brain: No evidence of acute infarction, hemorrhage, extra-axial collection, ventriculomegaly, or mass effect. Generalized cerebral atrophy. Periventricular white matter low attenuation likely secondary to microangiopathy. Vascular: Cerebrovascular atherosclerotic calcifications are noted. Skull: Negative for fracture or focal lesion. Sinuses/Orbits: Visualized portions of the orbits are unremarkable. Visualized portions of the paranasal sinuses and mastoid air cells are unremarkable. Other: None. IMPRESSION: No acute intracranial pathology. Electronically Signed   By: Kathreen Devoid   On: 10/15/2018 11:28   US Abdomen Limited Ruq  Result Date: 10/30/2018 CLINICAL DATA:  Elevated LFTs EXAM:  ULTRASOUND ABDOMEN LIMITED RIGHT UPPER QUADRANT COMPARISON:  CT abdomen and pelvis 09/22/2018  FINDINGS: Gallbladder: Surgically absent Common bile duct: Diameter: 4 mm diameter, normal Liver: Mildly heterogeneous upper normal echogenicity of liver with nodular contours consistent with cirrhosis. No discrete hepatic mass lesion identified, though assessment of intrahepatic detail is slightly limited due to sound attenuation. Portal vein is patent on color Doppler imaging with normal direction of blood flow towards the liver. Other: No RIGHT upper quadrant free fluid. IMPRESSION: Post cholecystectomy. Cirrhotic appearing liver without definite mass. Electronically Signed   By: Lavonia Dana M.D.   On: 10/30/2018 09:57     Assessment and plan- Patient is a 68 y.o. female with extensive stage small cell lung cancer and bone metastases status post 4 cycles of carboplatin etoposide and Tecentriq admitted for pancytopenia and streptococcal bacteremia  1.  4 out of 4 blood cultures are growing Streptococcus.  Further characterization is pending.  She is currently on IV ceftriaxone and ID is on board.  UA was positive but urine culture showed no growth.  COVID testing was negative.  Echocardiogram was done today.  Results are pending.  2.  Pancytopenia: Secondary to chemotherapy likely made worse due to ongoing bacteremia.` Transfuse irradiated PRBC for hemoglobin less than 7 and platelets less than 10.  He is likely going to have prolonged bone marrow suppression but would recover her counts over the next couple of weeks.    3.  Hypocalcemia/hypomagnesemia: Please replace  4.  Left labial abscess: Continue conservative management.  GYN has seen the patient as well.   Visit Diagnosis 1. Sepsis, due to unspecified organism, unspecified whether acute organ dysfunction present ([place])   2. Pancytopenia ([place])   3. Symptomatic anemia   4. Elevated liver enzymes      Dr. Randa Evens, MD, MPH [facility] [phone] 10/31/2018 2:21 PM

## 2018-11-01 ENCOUNTER — Inpatient Hospital Stay (HOSPITAL_COMMUNITY)
Admit: 2018-11-01 | Discharge: 2018-11-01 | Disposition: A | Discharge status: Home / Self Care | Payer: PPO | Attending: Internal Medicine | Admitting: Internal Medicine

## 2018-11-01 DIAGNOSIS — I361 Nonrheumatic tricuspid (valve) insufficiency: Secondary | ICD-10-CM

## 2018-11-01 DIAGNOSIS — I340 Nonrheumatic mitral (valve) insufficiency: Secondary | ICD-10-CM

## 2018-11-01 LAB — BPAM RBC
Blood Product Expiration Date: 202010012359
Blood Product Expiration Date: 202010062359
Blood Product Expiration Date: 202010142359
Blood Product Expiration Date: 202010202359
ISSUE DATE / TIME: 202009231626
ISSUE DATE / TIME: 202009241340
ISSUE DATE / TIME: 202009251347
Unit Type and Rh: 600
Unit Type and Rh: 6200
Unit Type and Rh: 6200
Unit Type and Rh: 9500

## 2018-11-01 LAB — TYPE AND SCREEN
ABO/RH(D): A POS
Antibody Screen: NEGATIVE
Unit division: 0
Unit division: 0
Unit division: 0
Unit division: 0

## 2018-11-01 LAB — HEMOGLOBIN AND HEMATOCRIT, BLOOD
HCT: 21.3 % — ABNORMAL LOW (ref 36.0–46.0)
HCT: 25.1 % — ABNORMAL LOW (ref 36.0–46.0)
Hemoglobin: 7 g/dL — ABNORMAL LOW (ref 12.0–15.0)
Hemoglobin: 8.4 g/dL — ABNORMAL LOW (ref 12.0–15.0)

## 2018-11-01 LAB — CULTURE, BLOOD (ROUTINE X 2)
Special Requests: ADEQUATE
Special Requests: ADEQUATE

## 2018-11-01 LAB — CBC
HCT: 20.2 % — ABNORMAL LOW (ref 36.0–46.0)
Hemoglobin: 6.7 g/dL — ABNORMAL LOW (ref 12.0–15.0)
MCH: 28.9 pg (ref 26.0–34.0)
MCHC: 33.2 g/dL (ref 30.0–36.0)
MCV: 87.1 fL (ref 80.0–100.0)
Platelets: 14 10*3/uL — CL (ref 150–400)
RBC: 2.32 MIL/uL — ABNORMAL LOW (ref 3.87–5.11)
RDW: 19 % — ABNORMAL HIGH (ref 11.5–15.5)
WBC: 0.4 10*3/uL — CL (ref 4.0–10.5)
nRBC: 0 % (ref 0.0–0.2)

## 2018-11-01 LAB — PREPARE RBC (CROSSMATCH)

## 2018-11-01 MED ORDER — CLINDAMYCIN PHOSPHATE 600 MG/50ML IV SOLN
600.0000 mg | Freq: Three times a day (TID) | INTRAVENOUS | Status: DC
  Administered 2018-11-01 – 2018-11-02 (×3): 600 mg via INTRAVENOUS
  Filled 2018-11-01 (×5): qty 50

## 2018-11-01 MED ORDER — SODIUM CHLORIDE 0.9% IV SOLUTION
Freq: Once | INTRAVENOUS | Status: AC
  Administered 2018-11-01: 14:00:00 via INTRAVENOUS

## 2018-11-01 NOTE — Progress Notes (Signed)
Pt just reported she has had 4 diarrhea stools today- she relates to her chemo with the same time frame that she has before after chemo. Advised pt to notify staff and let check each stool and will consult with provider where testing is indicated. She agreed with this plan. Pt and family member updated on current HGB after PRBC's.

## 2018-11-01 NOTE — Progress Notes (Signed)
Hgb 7.0 Gm prior to PRBC transfusion with one unit transfusing readily with pt tolerating well. No sign/symptom bleeding. Clindamycin IV continued. Has red, firm, elevated skin lesion on left labial area with no active drainage. VSS. Denies co's.

## 2018-11-01 NOTE — Progress Notes (Signed)
[name] NAME: Anne [name]    MR#:  [id number]  DATE OF BIRTH:  12-Oct-1950  SUBJECTIVE: Patient denies any complaints, received 1 unit of blood transfusion yesterday hemoglobin is still 6.7 today.  Patient denies any complaints, aerobic cultures are still pending from left labial wound.  CHIEF COMPLAINT:   Chief Complaint  Patient presents with  . Emesis   The patient has no complaints. REVIEW OF SYSTEMS:  Review of Systems  Constitutional: Negative for chills, fever and malaise/fatigue.  HENT: Negative for sore throat.   Eyes: Negative for blurred vision and double vision.  Respiratory: Negative for cough, hemoptysis, shortness of breath, wheezing and stridor.   Cardiovascular: Negative for chest pain, palpitations, orthopnea and leg swelling.  Gastrointestinal: Negative for abdominal pain, blood in stool, diarrhea, melena, nausea and vomiting.  Genitourinary: Negative for dysuria, flank pain and hematuria.  Musculoskeletal: Negative for back pain and joint pain.  Skin: Negative for rash.  Neurological: Negative for dizziness, sensory change, focal weakness, seizures, loss of consciousness, weakness and headaches.  Endo/Heme/Allergies: Negative for polydipsia.  Psychiatric/Behavioral: Negative for depression. The patient is not nervous/anxious.     DRUG ALLERGIES:   Allergies  Allergen Reactions  . Bee Venom Swelling   VITALS:  Blood pressure 95/79, pulse 80, temperature 97.7 F (36.5 C), temperature source Oral, resp. rate 20, height 5\' 5"  (1.651 m), weight 73.5 kg, SpO2 99 %. PHYSICAL EXAMINATION:  Physical Exam Constitutional:      General: She is not in acute distress.    Appearance: Normal appearance.  HENT:     Head: Normocephalic.     Mouth/Throat:     Mouth: Mucous membranes are moist.  Eyes:     General: No scleral icterus.    Conjunctiva/sclera: Conjunctivae normal.     Pupils: Pupils are equal,  round, and reactive to light.  Neck:     Musculoskeletal: Normal range of motion and neck supple.     Vascular: No JVD.     Trachea: No tracheal deviation.  Cardiovascular:     Rate and Rhythm: Normal rate and regular rhythm.     Heart sounds: Normal heart sounds. No murmur. No gallop.   Pulmonary:     Effort: Pulmonary effort is normal. No respiratory distress.     Breath sounds: Normal breath sounds. No wheezing or rales.  Abdominal:     General: Bowel sounds are normal. There is no distension.     Palpations: Abdomen is soft.     Tenderness: There is no abdominal tenderness. There is no rebound.  Musculoskeletal: Normal range of motion.        General: No tenderness.     Right lower leg: No edema.     Left lower leg: No edema.  Skin:    Findings: No erythema or rash.  Neurological:     General: No focal deficit present.     Mental Status: She is alert and oriented to person, place, and time.     Cranial Nerves: No cranial nerve deficit.  Psychiatric:        Mood and Affect: Mood normal.    LABORATORY PANEL:  Female CBC Recent Labs  Lab 11/01/18 0518  WBC 0.4*  HGB 6.7*  HCT 20.2*  PLT 14*   ------------------------------------------------------------------------------------------------------------------ Chemistries  Recent Labs  Lab 10/30/18 0548 10/31/18 0411  NA 136 136  K 3.4* 3.6  CL 103 109  CO2 20* 22  GLUCOSE  92 103*  BUN 15 9  CREATININE 0.69 0.58  CALCIUM 6.6* 6.8*  MG 1.3* 2.1  AST 25  --   ALT 31  --   ALKPHOS 120  --   BILITOT 1.0  --    RADIOLOGY:  No results found. ASSESSMENT AND PLAN:  Skin and soft tissue infection over perineum, left lower leg area, cultures were sent from the wound, waiting for the results, on ceftriaxone, added clindamycin by ID yesterday.  Patient does have febrile neutropenia, strep bacteremia, on Rocephin at this time.  Wait for full cultures.  Afebrile for the past 24 hours. .  Lactic acidosis.  Improved.   Pancytopenia- likely due to chemotherapy -1 unit PRBC given.  Worsening neutropenia.  The patient already received Neulasta as outpatient. Received 2 units of packed RBC so far, still anemic, will order 1 more unit of packed RBC to keep hemoglobin more than 7, seen by Dr. Janese Banks from oncology.  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 Janese Banks as an outpatient -Oncology consult  Elevated liver enzymes / hyperbilirubinemia- may be due to sepsis, improved.  Hyperlipidemia-stable -Continue home Crestor  Depression-stable -Continue home Wellbutrin  GERD- stable -Continue home PPI  Hypokalemia.  Improved with potassium supplement. Hypomagnesemia.  Improved with IV magnesium.   All the records are reviewed and case discussed with Care Management/Social Worker. Management plans discussed with the patient, family and they are in agreement.  CODE STATUS: Full Code  TOTAL TIME TAKING CARE OF THIS PATIENT: 36 minutes.   More than 50% of the time was spent in counseling/coordination of care: YES  POSSIBLE D/C IN 3 DAYS, DEPENDING ON CLINICAL CONDITION.   Epifanio Lesches M.D on 11/01/2018 at 10:12 AM  Between 7am to 6pm - Pager - [phone]  After 6pm go to [email or web address] - [facility]

## 2018-11-02 LAB — TYPE AND SCREEN
ABO/RH(D): A POS
Antibody Screen: NEGATIVE
Unit division: 0

## 2018-11-02 LAB — BPAM RBC
Blood Product Expiration Date: 202010202359
ISSUE DATE / TIME: 202009261345
Unit Type and Rh: 6200

## 2018-11-02 LAB — ECHOCARDIOGRAM COMPLETE
Height: 65 in
Weight: 2592 oz

## 2018-11-02 MED ORDER — HEPARIN SOD (PORK) LOCK FLUSH 100 UNIT/ML IV SOLN: 500.0000 [IU] | INTRAVENOUS | Status: DC | PRN

## 2018-11-02 MED ORDER — SODIUM CHLORIDE 0.9% FLUSH
10.0000 mL | INTRAVENOUS | Status: AC | PRN
  Administered 2018-11-02: 10 mL

## 2018-11-02 MED ORDER — SODIUM CHLORIDE 0.9% FLUSH
10.0000 mL | Freq: Every day | INTRAVENOUS | Status: DC
  Administered 2018-11-02 – 2018-11-03 (×2): 10 mL via INTRAVENOUS

## 2018-11-02 NOTE — Progress Notes (Addendum)
[facility] at [place] NAME: Anne [name]    MR#:  [id number]  DATE OF BIRTH:  23-Nov-1950  Sitting in the chair, doing sudoku puzzles, says that she feels better and wants to go home.  Because of positive blood cultures, immunosuppressed state patient needs IV antibiotics, discussed with Dr. Levester Fresh.  CHIEF COMPLAINT:   Chief Complaint  Patient presents with  . Emesis   Hemoglobin up at 8.4.. REVIEW OF SYSTEMS:  Review of Systems  Constitutional: Negative for chills, fever and malaise/fatigue.  HENT: Negative for sore throat.   Eyes: Negative for blurred vision and double vision.  Respiratory: Negative for cough, hemoptysis, shortness of breath, wheezing and stridor.   Cardiovascular: Negative for chest pain, palpitations, orthopnea and leg swelling.  Gastrointestinal: Negative for abdominal pain, blood in stool, diarrhea, melena, nausea and vomiting.  Genitourinary: Negative for dysuria, flank pain and hematuria.  Musculoskeletal: Negative for back pain and joint pain.  Skin: Negative for rash.       Left labial infection  Neurological: Negative for dizziness, sensory change, focal weakness, seizures, loss of consciousness, weakness and headaches.  Endo/Heme/Allergies: Negative for polydipsia.  Psychiatric/Behavioral: Negative for depression. The patient is not nervous/anxious.     DRUG ALLERGIES:   Allergies  Allergen Reactions  . Bee Venom Swelling   VITALS:  Blood pressure 135/64, pulse 85, temperature (!) 97.4 F (36.3 C), resp. rate 20, height 5\' 5"  (1.651 m), weight 73.5 kg, SpO2 100 %. PHYSICAL EXAMINATION:  Physical Exam Constitutional:      General: She is not in acute distress.    Appearance: Normal appearance.  HENT:     Head: Normocephalic.     Mouth/Throat:     Mouth: Mucous membranes are moist.  Eyes:     General: No scleral icterus.    Conjunctiva/sclera: Conjunctivae normal.     Pupils: Pupils are  equal, round, and reactive to light.  Neck:     Musculoskeletal: Normal range of motion and neck supple.     Vascular: No JVD.     Trachea: No tracheal deviation.  Cardiovascular:     Rate and Rhythm: Normal rate and regular rhythm.     Heart sounds: Normal heart sounds. No murmur. No gallop.   Pulmonary:     Effort: Pulmonary effort is normal. No respiratory distress.     Breath sounds: Normal breath sounds. No wheezing or rales.  Abdominal:     General: Bowel sounds are normal. There is no distension.     Palpations: Abdomen is soft.     Tenderness: There is no abdominal tenderness. There is no rebound.  Genitourinary:    Comments: Left perineal area head area of ulcerated, erythematous, indurated area. Musculoskeletal: Normal range of motion.        General: No tenderness.     Right lower leg: No edema.     Left lower leg: No edema.  Skin:    Findings: No erythema or rash.  Neurological:     General: No focal deficit present.     Mental Status: She is alert and oriented to person, place, and time.     Cranial Nerves: No cranial nerve deficit.  Psychiatric:        Mood and Affect: Mood normal.    LABORATORY PANEL:  Female CBC Recent Labs  Lab 11/01/18 0518  11/01/18 1801  WBC 0.4*  --   --   HGB 6.7*   < >  8.4*  HCT 20.2*   < > 25.1*  PLT 14*  --   --    < > = values in this interval not displayed.   ------------------------------------------------------------------------------------------------------------------ Chemistries  Recent Labs  Lab 10/30/18 0548 10/31/18 0411  NA 136 136  K 3.4* 3.6  CL 103 109  CO2 20* 22  GLUCOSE 92 103*  BUN 15 9  CREATININE 0.69 0.58  CALCIUM 6.6* 6.8*  MG 1.3* 2.1  AST 25  --   ALT 31  --   ALKPHOS 120  --   BILITOT 1.0  --    RADIOLOGY:  No results found. ASSESSMENT AND PLAN:  Skin and soft tissue infection over perineum, left labia infection cultures were sent from the wound, waiting for the results, on ceftriaxone,  added clindamycin by ID yesterday.  Patient does have febrile neutropenia, strep bacteremia, on Rocephin at this time.  Spoke with ID Dr. Levester Fresh, because of strep bacteremia, pancytopenia and immunosuppressed state patient is to be on IV Rocephin, wait for repeat blood cultures.  Pancytopenia- likely due to chemotherapy  Strep bacteremia likely source is skin infection, on IV Rocephin, spoke with Dr. Levester Fresh today.  Echocardiogram is done but results are pending.  Afebrile for 24 hours. 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 Janese Banks as an outpatient -Oncology consult  Elevated liver enzymes / hyperbilirubinemia- may be due to sepsis, improved.  Hyperlipidemia-stable -Continue home Crestor  Depression-stable -Continue home Wellbutrin  GERD- stable -Continue home PPI  Hypokalemia.  Improved with potassium supplement. Hypomagnesemia.  Improved with IV magnesium.  Diarrhea secondary to chemotherapy, stable, patient states that she always has diarrhea with chemo and it goes away with time.  She seems to be stable and not bothered about it. All the records are reviewed and case discussed with Care Management/Social Worker. Management plans discussed with the patient, family and they are in agreement.  CODE STATUS: Full Code  TOTAL TIME TAKING CARE OF THIS PATIENT: 36 minutes.   More than 50% of the time was spent in counseling/coordination of care: YES  POSSIBLE D/C IN 3 DAYS, DEPENDING ON CLINICAL CONDITION.   Epifanio Lesches M.D on 11/02/2018 at 9:53 AM  Between 7am to 6pm - Pager - [phone]  After 6pm go to [email or web address] - [facility]

## 2018-11-02 NOTE — Progress Notes (Addendum)
Afebrile;VSS. Rocephin IV continued. Staffed with Dr. Daria Pastures regarding lesion on left labia- care provided with MD to followup. Imodium effective for diarrhea this a.m.  Blood cultures obtained and pending. Pt and husband upset over not being discharged today as hoped; educated on needs including blood counts and protective isolation with chemotherapy; recent infection with appropriate treatment. Husband and pt became upset when clarified COVID visitation with oral and written education done with husband per his clarification-not able to come in and out during the day multiple times. Garth Bigness, charge nurse, in to speak to pt and husband to discuss concerns.

## 2018-11-02 NOTE — Progress Notes (Signed)
Spoke with Dr. Levester Fresh over the phone, recommends repeat blood cultures, continuing the Rocephin, stopping the clindamycin, will wait for the echocardiogram results to see the need for TEE.  So patient is to stay today to get IV Rocephin.

## 2018-11-02 NOTE — Progress Notes (Signed)
Spoke to patient, patient need to stay for IV antibiotic, repeat blood cultures ordered, echocardiogram results pending, very angry that she is not going home, went to take pictures of the buttock wound but patient is very angry at this time and even not looking at me when I am talking.  We will take pictures of the left buttock from tomorrow.

## 2018-11-03 ENCOUNTER — Other Ambulatory Visit: Payer: Self-pay | Admitting: *Deleted

## 2018-11-03 LAB — AEROBIC CULTURE W GRAM STAIN (SUPERFICIAL SPECIMEN): Gram Stain: NONE SEEN

## 2018-11-03 MED ORDER — OCUVITE-LUTEIN PO CAPS: 1.0000 | ORAL_CAPSULE | Freq: Every day | ORAL | 0 refills | Status: DC

## 2018-11-03 MED ORDER — SILVER SULFADIAZINE 1 % EX CREA: TOPICAL_CREAM | Freq: Two times a day (BID) | CUTANEOUS | 0 refills | Status: DC

## 2018-11-03 MED ORDER — METRONIDAZOLE 500 MG PO TABS
500.0000 mg | ORAL_TABLET | Freq: Two times a day (BID) | ORAL | Status: DC
  Administered 2018-11-03: 500 mg via ORAL
  Filled 2018-11-03: qty 1

## 2018-11-03 MED ORDER — POLYETHYLENE GLYCOL 3350 17 G PO PACK: 17.0000 g | PACK | Freq: Every day | ORAL | 0 refills | Status: DC | PRN

## 2018-11-03 MED ORDER — METRONIDAZOLE 500 MG PO TABS: 500.0000 mg | ORAL_TABLET | Freq: Three times a day (TID) | ORAL | 0 refills | Status: DC

## 2018-11-03 MED ORDER — CEFTRIAXONE IV (FOR PTA / DISCHARGE USE ONLY): 2.0000 g | INTRAVENOUS | 0 refills | Status: DC

## 2018-11-03 MED ORDER — SODIUM CHLORIDE 0.9 % IV SOLN: 2.0000 g | INTRAVENOUS | 0 refills | Status: AC

## 2018-11-03 MED ORDER — SODIUM CHLORIDE 0.9 % IV SOLN: 2.0000 g | INTRAVENOUS | 0 refills | Status: DC

## 2018-11-03 MED ORDER — METRONIDAZOLE 500 MG PO TABS: 500.0000 mg | ORAL_TABLET | Freq: Two times a day (BID) | ORAL | Status: DC

## 2018-11-03 MED ORDER — METRONIDAZOLE 500 MG PO TABS: 500.0000 mg | ORAL_TABLET | Freq: Two times a day (BID) | ORAL | 0 refills | Status: AC

## 2018-11-03 MED ORDER — ENSURE ENLIVE PO LIQD: 237.0000 mL | Freq: Two times a day (BID) | ORAL | 12 refills | Status: DC

## 2018-11-03 NOTE — Progress Notes (Signed)
Dr. Janese Banks wants CBC before discharging to monitor her platelet count.

## 2018-11-03 NOTE — Consult Note (Signed)
PHARMACY CONSULT NOTE FOR:  OUTPATIENT  PARENTERAL ANTIBIOTIC THERAPY (OPAT)  Indication:Strep anginosus bacteremia  Regimen: Ceftriaxone 2 grams Iv every 24 hours until 11/12/18 End date: 11/12/18   IV antibiotic discharge orders are pended. To discharging provider:  please sign these orders via discharge navigator,  Select New Orders & click on the button choice - Manage This Unsigned Work.     Thank you for allowing pharmacy to be a part of this patient's care.  Anne [name] 11/03/2018, 12:07 PM

## 2018-11-03 NOTE — Care Management Important Message (Signed)
Important Message  Patient Details  Name: Anne [name] MRN: [id number] Date of Birth: 09/30/1950   Medicare Important Message Given:  Yes     Dannette Barbara 11/03/2018, 10:16 AM

## 2018-11-03 NOTE — Progress Notes (Signed)
Pt d/c'd home with husband..Pt talked with [facility] and verbalized understanding of care at home. Pt reviewed meds including Flagyl. Taken via WC to POV.

## 2018-11-03 NOTE — TOC Initial Note (Addendum)
Transition of Care [facility]) - Initial/Assessment Note    Patient Details  Name: Anne [name] MRN: [id number] Date of Birth: 1950/07/10  Transition of Care [facility]) CM/SW Contact:    Candie Chroman, LCSW Phone Number: 11/03/2018, 10:52 AM  Clinical Narrative: CSW met with patient, introduced role, and explained that discharge planning would be discussed. Patient will discharge on IV abx until 10/7. Referral made to Carolynn Sayers with [facility] for infusion management. Patient will also need home health RN. She does not feel like she needs an aide at this time. [facility], [facility], [facility], and [facility] have declined. Waiting on response from [facility] before moving on to other agencies as these are the highest rated. Patient's husband will take her home today.       11:30 am: [facility] and [facility] declined. [facility] accepted referral. Left message for Carolynn Sayers to notify.        Expected Discharge Plan: [place] Barriers to Discharge: Barriers Resolved   Patient Goals and CMS Choice   CMS Medicare.gov Compare Post Acute Care list provided to:: Patient    Expected Discharge Plan and Services Expected Discharge Plan: [place]     Post Acute Care Choice: Home Health   Expected Discharge Date: 11/03/18                         HH Arranged: IV Antibiotics, RN          Prior Living Arrangements/Services   Lives with:: Spouse Patient language and need for interpreter reviewed:: Yes Do you feel safe going back to the place where you live?: Yes      Need for Family Participation in Patient Care: Yes (Comment) Care giver support system in place?: Yes (comment)   Criminal Activity/Legal Involvement Pertinent to Current Situation/Hospitalization: No - Comment as needed  Activities of Daily Living Home Assistive Devices/Equipment: None ADL Screening (condition at time of admission) Patient's cognitive ability adequate to  safely complete daily activities?: Yes Is the patient deaf or have difficulty hearing?: No Does the patient have difficulty seeing, even when wearing glasses/contacts?: No Does the patient have difficulty concentrating, remembering, or making decisions?: No Patient able to express need for assistance with ADLs?: Yes Does the patient have difficulty dressing or bathing?: No Independently performs ADLs?: Yes (appropriate for developmental age) Does the patient have difficulty walking or climbing stairs?: No Weakness of Legs: None Weakness of Arms/Hands: None  Permission Sought/Granted Permission sought to share information with : Facility Art therapist granted to share information with : Yes, Verbal Permission Granted     Permission granted to share info w AGENCY: Home Health Agencies        Emotional Assessment Appearance:: Appears stated age Attitude/Demeanor/Rapport: Engaged, Gracious Affect (typically observed): Accepting, Appropriate, Calm, Pleasant Orientation: : Oriented to Self, Oriented to Place, Oriented to  Time, Oriented to Situation Alcohol / Substance Use: Never Used Psych Involvement: No (comment)  Admission diagnosis:  Elevated liver enzymes [R74.8] Pancytopenia (HCC) [D61.818] Symptomatic anemia [D64.9] Sepsis, due to unspecified organism, unspecified whether acute organ dysfunction present [facility]) [A41.9] Patient Active Problem List   Diagnosis Date Noted  . Sepsis ([place]) 10/29/2018  . Goals of care, counseling/discussion 08/06/2018  . Small cell lung cancer, right upper lobe ([place]) 08/06/2018  . Bone metastases ([place]) 08/06/2018  . Bleeding nose 02/26/2018  . Vision changes 02/26/2018  . Skin tear of forearm without complication, initial encounter  12/06/2017  . Bruising 12/06/2017  . Obesity (BMI 30.0-34.9) 08/05/2017  . Ventral hernia 03/01/2017  . Abdominal wall hernia 02/15/2017  . Lung nodule < 6cm on CT 02/15/2017  . Anemia 01/23/2017   . Cirrhosis of liver ([place]) 01/23/2017  . Gastritis 01/23/2017  . Chest pain 01/03/2017  . Hypokalemia 01/03/2017  . Weight loss 12/13/2016  . Callus of foot 07/27/2016  . Heart murmur 07/27/2016  . Right calf pain 04/20/2016  . Chronic low back pain 08/15/2015  . Right shoulder pain 06/14/2015  . Benign essential HTN 02/22/2015  . HLD (hyperlipidemia) 02/22/2015  . Depression 02/22/2015   PCP:  Leone Haven, MD Pharmacy:   [facility] [address], [place] - [place] [address] [place] [address] Phone: [phone] Fax: [phone]     Social Determinants of Health (SDOH) Interventions    Readmission Risk Interventions No flowsheet data found.

## 2018-11-03 NOTE — Progress Notes (Signed)
ID Pt doing well Says the ball on her bottom is better  Patient Vitals for the past 24 hrs:  BP Temp Temp src Pulse Resp SpO2  11/03/18 0341 127/79 97.8 F (36.6 C) Oral 78 16 98 %  11/02/18 1910 127/70 97.7 F (36.5 C) Oral 84 15 100 %  11/02/18 1409 137/70 (!) 97.4 F (36.3 C) - 87 17 98 %   Awake and alert Chest b/l air entry HS s1s2 Abd soft Perineum- left side near the laba a 3cm indurated area with superfical ulceration discharging purulent fluid Induration and erythema is better Port chest wall 11/03/18    Last week    CBC Latest Ref Rng & Units 11/01/2018 11/01/2018 11/01/2018  WBC 4.0 - 10.5 K/uL - - 0.4(LL)  Hemoglobin 12.0 - 15.0 g/dL 8.4(L) 7.0(L) 6.7(L)  Hematocrit 36.0 - 46.0 % 25.1(L) 21.3(L) 20.2(L)  Platelets 150 - 400 K/uL - - 14(LL)   CMP Latest Ref Rng & Units 10/31/2018 10/30/2018 10/29/2018  Glucose 70 - 99 mg/dL 103(H) 92 -  BUN 8 - 23 mg/dL 9 15 -  Creatinine 0.44 - 1.00 mg/dL 0.58 0.69 -  Sodium 135 - 145 mmol/L 136 136 -  Potassium 3.5 - 5.1 mmol/L 3.6 3.4(L) -  Chloride 98 - 111 mmol/L 109 103 -  CO2 22 - 32 mmol/L 22 20(L) -  Calcium 8.9 - 10.3 mg/dL 6.8(L) 6.6(L) -  Total Protein 6.5 - 8.1 g/dL - 5.4(L) -  Total Bilirubin 0.3 - 1.2 mg/dL - 1.0 1.5(H)  Alkaline Phos 38 - 126 U/L - 120 -  AST 15 - 41 U/L - 25 -  ALT 0 - 44 U/L - 31 -    Micro BC from 9/23 strep anginosus WC from 9/25 efecium-  9/27 BC -NG  ECHO no vegetation on heart valve  Impression/Recommendation  Soft tissue infection with abscess over the perineum- Is improving, draining as well Seen by GYN- no surgery was recommended because of low platelet and now the area is improving efecium is a stool contaminant Will give flagyl 500mg  PO BID for 7 days along with IV ceftriaxone ( the last date for Iv ceftriaxone is 10/7)  Febrile neutropenia- fever has resolved Neutropenia persisit- no recent wbc  Strep anginosis bacteremia a GI pathogen Could be from mucositis or  abscess 2 d echo no vegetation- will avoid TEE because of severe thrombocytopenia . Will give 2 weeks of IV and then repeat a blood culture a week after antibiotic  Metastatic lung carcinoma on palliative chemo- hold chemo until the abscess has completely resolved and she has completed IV antibiotic  Explained in detail the plan with Patient. Discussed with Dr.Konidena Will follow her in 1 week

## 2018-11-03 NOTE — TOC Transition Note (Signed)
Transition of Care [facility]) - CM/SW Discharge Note   Patient Details  Name: Anne [name] MRN: [id number] Date of Birth: 01/21/1951  Transition of Care [facility]) CM/SW Contact:  Candie Chroman, LCSW Phone Number: 11/03/2018, 11:53 AM   Clinical Narrative: [facility]ynn Sayers with [facility] will call patient in the room to explain their services before patient leaves. Husband is at bedside to take her home. Patient is aware that [facility] will follow up with her tomorrow to start home health RN services. No further concerns. CSW signing off.    Final next level of care: Home w Home Health Services Barriers to Discharge: Barriers Resolved   Patient Goals and CMS Choice   CMS Medicare.gov Compare Post Acute Care list provided to:: Patient Choice offered to / list presented to : Patient, Spouse  Discharge Placement                Patient to be transferred to facility by: Husband will transport Name of family member notified: [name] Chrisley Patient and family notified of of transfer: 11/03/18  Discharge Plan and Services     Post Acute Care Choice: Home Health          DME Arranged: N/A         HH Arranged: IV Antibiotics, RN HH Agency: [facility] Date [place]: 11/03/18   Representative spoke with at [place]: [place] ([place]) Interventions     Readmission Risk Interventions No flowsheet data found.

## 2018-11-03 NOTE — Progress Notes (Signed)
Diagnosis: Strep anginosus bacteremia Baseline Creatinine <1    Allergies  Allergen Reactions  . Bee Venom Swelling    OPAT Orders Discharge antibiotics: Ceftriaxone 2 grams Iv every 24 hours until 11/12/18 PORT Care Per Protocol:  Labs weekly while on IV antibiotics: _X_ CBC with differential  X__ CMP    Fax weekly labs to [phone]   Call  Dr.Giulianna Rocha [phone] with any questions

## 2018-11-03 NOTE — Discharge Summary (Addendum)
Leimomi Zervas Joens, is a 68 y.o. female  DOB 1950/03/19  MRN [id number].  Admission date:  10/29/2018  Admitting Physician  Sela Hua, MD  Discharge Date:  11/03/2018   Primary MD  Leone Haven, MD  Recommendations for primary care physician for things to follow:   Follow with PCP in 1 week Follow-up with Dr. Levester Fresh in 1 week Follow-up with Dr Randa Evens from cancer center as a scheduled.   Admission Diagnosis  Elevated liver enzymes [R74.8] Pancytopenia (HCC) [D61.818] Symptomatic anemia [D64.9] Sepsis, due to unspecified organism, unspecified whether acute organ dysfunction present [facility]) [A41.9]   Discharge Diagnosis  Elevated liver enzymes [R74.8] Pancytopenia ([place]) [D61.818] Symptomatic anemia [D64.9] Sepsis, due to unspecified organism, unspecified whether acute organ dysfunction present [facility]) [A41.9]    Active Problems:   Sepsis [facility])      Past Medical History:  Diagnosis Date  . Arthritis   . Cirrhosis ([place])   . Depression   . GERD (gastroesophageal reflux disease)   . Hyperlipidemia   . Hypertension   . Lung cancer ([place])   . Metastatic bone cancer [facility])     Past Surgical History:  Procedure Laterality Date  . APPENDECTOMY  1971  . CHOLECYSTECTOMY  1971  . COLONOSCOPY WITH PROPOFOL N/A 11/15/2016   Procedure: COLONOSCOPY WITH PROPOFOL;  Surgeon: Jonathon Bellows, MD;  Location: [facility] ENDOSCOPY;  Service: Gastroenterology;  Laterality: N/A;  . ENDOBRONCHIAL ULTRASOUND Right 07/30/2018   Procedure: ENDOBRONCHIAL ULTRASOUND;  Surgeon: Laverle Hobby, MD;  Location: [facility] ORS;  Service: Pulmonary;  Laterality: Right;  . ESOPHAGOGASTRODUODENOSCOPY (EGD) WITH PROPOFOL N/A 01/07/2017   Procedure: ESOPHAGOGASTRODUODENOSCOPY (EGD) WITH PROPOFOL;  Surgeon: Jonathon Bellows, MD;  Location: [facility]  ENDOSCOPY;  Service: Gastroenterology;  Laterality: N/A;  . LAPAROSCOPY N/A 03/01/2017   Procedure: LAPAROSCOPY DIAGNOSTIC;  Surgeon: Robert Bellow, MD;  Location: [facility] ORS;  Service: General;  Laterality: N/A;  . PORTA CATH INSERTION N/A 08/14/2018   Procedure: PORTA CATH INSERTION;  Surgeon: Algernon Huxley, MD;  Location: [place] CV LAB;  Service: Cardiovascular;  Laterality: N/A;  . TONSILECTOMY, ADENOIDECTOMY, BILATERAL MYRINGOTOMY [place]  . TONSILLECTOMY    . VENTRAL HERNIA REPAIR N/A 03/01/2017   10 x 14 CM Ventralight ST mesh, intraperitoneal location.   . VENTRAL HERNIA REPAIR N/A 03/01/2017   Procedure: HERNIA REPAIR VENTRAL ADULT;  Surgeon: Robert Bellow, MD;  Location: [facility] ORS;  Service: General;  Laterality: N/A;       History of present illness and  Hospital Course:     Kindly see H&P for history of present illness and admission details, please review complete Labs, Consult reports and Test reports for all details in brief  HPI  from the history and physical done on the day of admission    Hospital Course  Sepsis present on admission but resolved, secondary to strep Anginosis bacteremia source is kidney infection from the left buttock, seen by Dr. Levester Fresh, recommended discharging today with IV Rocephin 2 g every 24 hours daily till October 7.  Echocardiogram was done and they did not show any vegetations, patient may need TEE if patient continues to have recurrent symptoms.  At this time patient is very adamant she wants to go home.  Enterococci from the left buttock wound likely still contaminant .id recommends to give Flagyl 500 mg p.o. twice daily for 7 days. 2.  Febrile neutropenia secondary to skin and soft tissue infection, improved, afebrile for the last 3  days, received IV Rocephin, clindamycin. 3.  Pancytopenia, secondary to cancer, anemia secondary to cancer as well, required 3 units of blood transfusion, usually irradiated PRBC.  4.  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 Bridgett Larsson neurology outpatient. Elevated liver enzymes secondary to sepsis improving, 5.  Depression, continue Wellbutrin  Neutropenia secondary to lung cancer, chemotherapy, received Neupogen.  Seen by Dr. Randa Evens in the hospital.  Discharge Condition: Stable   Follow UP Follow-up with PCP in 1 week Follow-up with Dr. Tama High in 1 week Follow-up with Dr. Randa Evens as scheduled.Marland Kitchen     Discharge Instructions  and  Discharge Medications   Patient needs IV antibiotics with Rocephin 2 g IV every 24 hours until October 7, supportive care as per protocol, labs weekly while on IV antibiotics with CBC With differential, CMP and fax weekly labs to [phone]. Fall precautions due to thrombocytopenia  Allergies as of 11/03/2018      Reactions   Bee Venom Swelling      Medication List    STOP taking these medications   oxyCODONE 5 MG immediate release tablet Commonly known as: Oxy IR/ROXICODONE     TAKE these medications   buPROPion 300 MG 24 hr tablet Commonly known as: WELLBUTRIN XL Take 1 tablet by mouth once daily   cefTRIAXone 2 g in sodium chloride 0.9 % 100 mL Inject 2 g into the vein daily. Start taking on: November 04, 2018   escitalopram 10 MG tablet Commonly known as: Lexapro Take 1 tablet (10 mg total) by mouth daily.   feeding supplement (ENSURE ENLIVE) Liqd Take 237 mLs by mouth 2 (two) times daily between meals.   folic acid 1 MG tablet Commonly known as: FOLVITE Take 1 tablet (1 mg total) by mouth daily.   ibuprofen 200 MG tablet Commonly known as: ADVIL Take 400 mg by mouth every 8 (eight) hours as needed for headache or moderate pain.   lidocaine-prilocaine cream Commonly known as: EMLA Apply to affected area once   loperamide 2 MG capsule Commonly known as: IMODIUM Take 1 capsule (2 mg total) by mouth every 2 (two) hours as needed for diarrhea or loose stools. Do not exceed 16mg  daily.    LORazepam 0.5 MG tablet Commonly known as: Ativan Take 1 tablet (0.5 mg total) by mouth every 6 (six) hours as needed (Nausea or vomiting).   metroNIDAZOLE 500 MG tablet Commonly known as: Flagyl Take 1 tablet (500 mg total) by mouth 3 (three) times daily for 7 days.   ondansetron 8 MG tablet Commonly known as: Zofran Take 1 tablet (8 mg total) by mouth 2 (two) times daily as needed for refractory nausea / vomiting. Start on day 3 after carboplatin chemo.   pantoprazole 40 MG tablet Commonly known as: PROTONIX TAKE 1 TABLET BY MOUTH TWICE DAILY BEFORE A MEAL What changed: See the new instructions.   polyethylene glycol 17 g packet Commonly known as: MIRALAX / GLYCOLAX Take 17 g by mouth daily as needed for mild constipation.   potassium chloride SA 20 MEQ tablet Commonly known as: K-DUR Take 1 tablet (20 mEq total) by mouth 2 (two) times daily.   PreserVision AREDS 2 Caps Take 1 capsule by mouth 2 (two) times a day. What changed: Another medication with the same name was added. Make sure you understand how and when to take each.   multivitamin-lutein Caps capsule Take 1 capsule by mouth daily. Start taking on: November 04, 2018 What  changed: You were already taking a medication with the same name, and this prescription was added. Make sure you understand how and when to take each.   prochlorperazine 10 MG tablet Commonly known as: COMPAZINE Take 1 tablet (10 mg total) by mouth every 6 (six) hours as needed (Nausea or vomiting).   rosuvastatin 20 MG tablet Commonly known as: CRESTOR TAKE 1 TABLET BY MOUTH ONCE DAILY   silver sulfADIAZINE 1 % cream Commonly known as: SILVADENE Apply topically 2 (two) times daily.         Diet and Activity recommendation: See Discharge Instructions above   Consults obtained -oncology, ID   Major procedures and Radiology Reports - PLEASE review detailed and final reports for all details, in brief -     Ct Head Wo  Contrast  Result Date: 10/15/2018 CLINICAL DATA:  Altered level of consciousness EXAM: CT HEAD WITHOUT CONTRAST TECHNIQUE: Contiguous axial images were obtained from the base of the skull through the vertex without intravenous contrast. COMPARISON:  None. FINDINGS: Brain: No evidence of acute infarction, hemorrhage, extra-axial collection, ventriculomegaly, or mass effect. Generalized cerebral atrophy. Periventricular white matter low attenuation likely secondary to microangiopathy. Vascular: Cerebrovascular atherosclerotic calcifications are noted. Skull: Negative for fracture or focal lesion. Sinuses/Orbits: Visualized portions of the orbits are unremarkable. Visualized portions of the paranasal sinuses and mastoid air cells are unremarkable. Other: None. IMPRESSION: No acute intracranial pathology. Electronically Signed   By: Kathreen Devoid   On: 10/15/2018 11:28   US Abdomen Limited Ruq  Result Date: 10/30/2018 CLINICAL DATA:  Elevated LFTs EXAM: ULTRASOUND ABDOMEN LIMITED RIGHT UPPER QUADRANT COMPARISON:  CT abdomen and pelvis 09/22/2018 FINDINGS: Gallbladder: Surgically absent Common bile duct: Diameter: 4 mm diameter, normal Liver: Mildly heterogeneous upper normal echogenicity of liver with nodular contours consistent with cirrhosis. No discrete hepatic mass lesion identified, though assessment of intrahepatic detail is slightly limited due to sound attenuation. Portal vein is patent on color Doppler imaging with normal direction of blood flow towards the liver. Other: No RIGHT upper quadrant free fluid. IMPRESSION: Post cholecystectomy. Cirrhotic appearing liver without definite mass. Electronically Signed   By: Lavonia Dana M.D.   On: 10/30/2018 09:57    Micro Results    Recent Results (from the past 240 hour(s))  Respiratory Panel by PCR     Status: None   Collection Time: 10/29/18  6:43 AM   Specimen: Nasopharyngeal Swab; Respiratory  Result Value Ref Range Status   Adenovirus NOT DETECTED  NOT DETECTED Final   Coronavirus 229E NOT DETECTED NOT DETECTED Final    Comment: (NOTE) The Coronavirus on the Respiratory Panel, DOES NOT test for the novel  Coronavirus (2019 nCoV)    Coronavirus HKU1 NOT DETECTED NOT DETECTED Final   Coronavirus NL63 NOT DETECTED NOT DETECTED Final   Coronavirus OC43 NOT DETECTED NOT DETECTED Final   Metapneumovirus NOT DETECTED NOT DETECTED Final   Rhinovirus / Enterovirus NOT DETECTED NOT DETECTED Final   Influenza A NOT DETECTED NOT DETECTED Final   Influenza B NOT DETECTED NOT DETECTED Final   Parainfluenza Virus 1 NOT DETECTED NOT DETECTED Final   Parainfluenza Virus 2 NOT DETECTED NOT DETECTED Final   Parainfluenza Virus 3 NOT DETECTED NOT DETECTED Final   Parainfluenza Virus 4 NOT DETECTED NOT DETECTED Final   Respiratory Syncytial Virus NOT DETECTED NOT DETECTED Final   Bordetella pertussis NOT DETECTED NOT DETECTED Final   Chlamydophila pneumoniae NOT DETECTED NOT DETECTED Final   Mycoplasma pneumoniae NOT DETECTED NOT DETECTED Final  Comment: Performed at [facility], [place] [address], [place], [place] [address]  Blood Culture (routine x 2)     Status: Abnormal   Collection Time: 10/29/18 10:56 AM   Specimen: BLOOD  Result Value Ref Range Status   Specimen Description   Final    BLOOD BLOOD LEFT FOREARM Performed at [facility], [address] [place], [place] [address]    Special Requests   Final    BOTTLES DRAWN AEROBIC AND ANAEROBIC Blood Culture adequate volume Performed at [facility], [address], [place], [place] [address]    Culture  Setup Time   Final    GRAM POSITIVE COCCI IN BOTH AEROBIC AND ANAEROBIC BOTTLES CRITICAL RESULT CALLED TO, READ BACK BY AND VERIFIED WITH: [place] ON 10/30/2018 SNG Performed at [facility], [place] [address], [place], [place] [address]    Culture STREPTOCOCCUS ANGINOSIS (A)  Final   Report Status 11/01/2018 FINAL  Final   Organism ID,  Bacteria STREPTOCOCCUS ANGINOSIS  Final      Susceptibility   Streptococcus anginosis - MIC*    PENICILLIN 0.12 SENSITIVE Sensitive     CEFTRIAXONE 1 SENSITIVE Sensitive     ERYTHROMYCIN <=0.12 SENSITIVE Sensitive     LEVOFLOXACIN 0.5 SENSITIVE Sensitive     VANCOMYCIN 0.5 SENSITIVE Sensitive     * STREPTOCOCCUS ANGINOSIS  Blood Culture ID Panel (Reflexed)     Status: Abnormal   Collection Time: 10/29/18 10:56 AM  Result Value Ref Range Status   Enterococcus species NOT DETECTED NOT DETECTED Final   Listeria monocytogenes NOT DETECTED NOT DETECTED Final   Staphylococcus species NOT DETECTED NOT DETECTED Final   Staphylococcus aureus (BCID) NOT DETECTED NOT DETECTED Final   Streptococcus species DETECTED (A) NOT DETECTED Final    Comment: Not Enterococcus species, Streptococcus agalactiae, Streptococcus pyogenes, or Streptococcus pneumoniae. CRITICAL RESULT CALLED TO, READ BACK BY AND VERIFIED WITH: DAVID BESANTI AT 0207 ON 10/30/2018 SNG    Streptococcus agalactiae NOT DETECTED NOT DETECTED Final   Streptococcus pneumoniae NOT DETECTED NOT DETECTED Final   Streptococcus pyogenes NOT DETECTED NOT DETECTED Final   Acinetobacter baumannii NOT DETECTED NOT DETECTED Final   Enterobacteriaceae species NOT DETECTED NOT DETECTED Final   Enterobacter cloacae complex NOT DETECTED NOT DETECTED Final   Escherichia coli NOT DETECTED NOT DETECTED Final   Klebsiella oxytoca NOT DETECTED NOT DETECTED Final   Klebsiella pneumoniae NOT DETECTED NOT DETECTED Final   Proteus species NOT DETECTED NOT DETECTED Final   Serratia marcescens NOT DETECTED NOT DETECTED Final   Haemophilus influenzae NOT DETECTED NOT DETECTED Final   Neisseria meningitidis NOT DETECTED NOT DETECTED Final   Pseudomonas aeruginosa NOT DETECTED NOT DETECTED Final   Candida albicans NOT DETECTED NOT DETECTED Final   Candida glabrata NOT DETECTED NOT DETECTED Final   Candida krusei NOT DETECTED NOT DETECTED Final   Candida  parapsilosis NOT DETECTED NOT DETECTED Final   Candida tropicalis NOT DETECTED NOT DETECTED Final    Comment: Performed at [facility], [address] [place], [place] [address]  Blood Culture (routine x 2)     Status: Abnormal   Collection Time: 10/29/18 10:59 AM   Specimen: BLOOD  Result Value Ref Range Status   Specimen Description   Final    BLOOD LEFT ANTECUBITAL Performed at [facility], [address] [place], [place] [address]    Special Requests   Final    BOTTLES DRAWN AEROBIC AND ANAEROBIC  Blood Culture adequate volume Performed at [facility], [address] [place], [place] [address]    Culture  Setup Time   Final    GRAM POSITIVE COCCI IN BOTH AEROBIC AND ANAEROBIC BOTTLES CRITICAL VALUE NOTED.  VALUE IS CONSISTENT WITH PREVIOUSLY REPORTED AND CALLED VALUE. Performed at [facility], [address] [place], [place] [address]    Culture (A)  Final    STREPTOCOCCUS ANGINOSIS SUSCEPTIBILITIES PERFORMED ON PREVIOUS CULTURE WITHIN THE LAST 5 DAYS. Performed at [facility], [place] [address] [place], [place] [address]    Report Status 11/01/2018 FINAL  Final  SARS Coronavirus 2 [facility] order, Performed in [facility] hospital lab) Nasopharyngeal Nasopharyngeal Swab     Status: None   Collection Time: 10/29/18 12:08 PM   Specimen: Nasopharyngeal Swab  Result Value Ref Range Status   SARS Coronavirus 2 NEGATIVE NEGATIVE Final    Comment: (NOTE) If result is NEGATIVE SARS-CoV-2 target nucleic acids are NOT DETECTED. The SARS-CoV-2 RNA is generally detectable in upper and lower  respiratory specimens during the acute phase of infection. The lowest  concentration of SARS-CoV-2 viral copies this assay can detect is 250  copies / mL. A negative result does not preclude SARS-CoV-2 infection  and should not be used as the sole basis for treatment or other  patient management decisions.  A negative result may occur with   improper specimen collection / handling, submission of specimen other  than nasopharyngeal swab, presence of viral mutation(s) within the  areas targeted by this assay, and inadequate number of viral copies  (<250 copies / mL). A negative result must be combined with clinical  observations, patient history, and epidemiological information. If result is POSITIVE SARS-CoV-2 target nucleic acids are DETECTED. The SARS-CoV-2 RNA is generally detectable in upper and lower  respiratory specimens dur ing the acute phase of infection.  Positive  results are indicative of active infection with SARS-CoV-2.  Clinical  correlation with patient history and other diagnostic information is  necessary to determine patient infection status.  Positive results do  not rule out bacterial infection or co-infection with other viruses. If result is PRESUMPTIVE POSTIVE SARS-CoV-2 nucleic acids MAY BE PRESENT.   A presumptive positive result was obtained on the submitted specimen  and confirmed on repeat testing.  While 2019 novel coronavirus  (SARS-CoV-2) nucleic acids may be present in the submitted sample  additional confirmatory testing may be necessary for epidemiological  and / or clinical management purposes  to differentiate between  SARS-CoV-2 and other Sarbecovirus currently known to infect humans.  If clinically indicated additional testing with an alternate test  methodology [phone]) is advised. The SARS-CoV-2 RNA is generally  detectable in upper and lower respiratory sp ecimens during the acute  phase of infection. The expected result is Negative. Fact Sheet for Patients:  StrictlyIdeas.no Fact Sheet for Healthcare Providers: BankingDealers.co.za This test is not yet approved or cleared by the [place] FDA and has been authorized for detection and/or diagnosis of SARS-CoV-2 by FDA under an Emergency Use Authorization (EUA).  This EUA will  remain in effect (meaning this test can be used) for the duration of the COVID-19 declaration under Section 564(b)(1) of the Act, 21 U.S.C. section 360bbb-3(b)(1), unless the authorization is terminated or revoked sooner. Performed at [facility], [address], [place], [place] [address]   Urine culture     Status: None   Collection Time: 10/29/18  1:54 PM   Specimen: In/Out  Cath Urine  Result Value Ref Range Status   Specimen Description   Final    IN/OUT CATH URINE Performed at [facility], [address] [place], [place] [address]    Special Requests   Final    NONE Performed at [facility], [address], [place], [place] [address]    Culture   Final    NO GROWTH Performed at [facility], [place] [address], [place], [place] [address]    Report Status 10/30/2018 FINAL  Final  Aerobic Culture (superficial specimen)     Status: None (Preliminary result)   Collection Time: 10/31/18  6:34 PM   Specimen: Wound  Result Value Ref Range Status   Specimen Description   Final    WOUND LEFT PERINEAL Performed at [facility], [place] [address], [place], [place] [address]    Special Requests   Final    NONE Performed at [facility], [place], [place] [address]    Gram Stain NO WBC SEEN NO ORGANISMS SEEN   Final   Culture   Final    FEW ENTEROCOCCUS FAECIUM SUSCEPTIBILITIES TO FOLLOW Performed at [facility], [place] [address] [place], [place] [address]    Report Status PENDING  Incomplete  CULTURE, BLOOD (ROUTINE X 2) w Reflex to ID Panel     Status: None (Preliminary result)   Collection Time: 11/02/18 11:00 AM   Specimen: BLOOD  Result Value Ref Range Status   Specimen Description BLOOD LEFT ANTECUBITAL  Final   Special Requests   Final    BOTTLES DRAWN AEROBIC AND ANAEROBIC Blood Culture adequate volume   Culture   Final    NO GROWTH < 24 HOURS Performed at [facility], [address] [place], [place] [address]    Report Status PENDING  Incomplete  CULTURE, BLOOD (ROUTINE X 2) w Reflex to ID Panel     Status: None (Preliminary result)   Collection Time: 11/02/18 11:00 AM   Specimen: BLOOD  Result Value Ref Range Status   Specimen Description BLOOD RIGHT ANTECUBITAL  Final   Special Requests   Final    BOTTLES DRAWN AEROBIC AND ANAEROBIC Blood Culture results may not be optimal due to an excessive volume of blood received in culture bottles   Culture   Final    NO GROWTH < 24 HOURS Performed at [facility], [address] [place], [place] [address]    Report Status PENDING  Incomplete       Today   Subjective:   Desirae Mancusi today has no headache,no chest abdominal pain,no new weakness tingling or numbness, feels much better wants to go home today.  Objective:   Blood pressure 127/79, pulse 78, temperature 97.8 F (36.6 C), temperature source Oral, resp. rate 16, height 5\' 5"  (1.651 m), weight 73.5 kg, SpO2 98 %.   Intake/Output Summary (Last 24 hours) at 11/03/2018 1040 Last data filed at 11/02/2018 1700 Gross per 24 hour  Intake 498.75 ml  Output -  Net 498.75 ml    Exam Awake Alert, Oriented x 3, No new F.N deficits, Normal affect .AT,PERRAL Supple Neck,No JVD, No cervical lymphadenopathy appriciated.  Symmetrical Chest wall movement, Good air movement bilaterally, CTAB RRR,No Gallops,Rubs or new Murmurs, No Parasternal Heave +ve B.Sounds, Abd Soft, Non tender, No organomegaly appriciated, No rebound -guarding or rigidity. Patient has left buttock ulcer, less erythematous today.  Data Review   CBC w Diff:  Lab Results  Component Value Date   WBC 0.4 (LL) 11/01/2018   HGB 8.4 (L) 11/01/2018   HCT 25.1 (L) 11/01/2018   PLT 14 (LL) 11/01/2018   LYMPHOPCT 4 10/29/2018   BANDSPCT 8 10/28/2018   MONOPCT 1 10/29/2018   EOSPCT 0 10/29/2018   BASOPCT 3 10/29/2018    CMP:  Lab Results  Component Value Date   NA 136  10/31/2018   K 3.6 10/31/2018   CL 109 10/31/2018   CO2 22 10/31/2018   BUN 9 10/31/2018   CREATININE 0.58 10/31/2018   CREATININE 1.09 02/26/2012   GLU 93 02/15/2017   PROT 5.4 (L) 10/30/2018   ALBUMIN 2.9 (L) 10/30/2018   BILITOT 1.0 10/30/2018   ALKPHOS 120 10/30/2018   AST 25 10/30/2018   ALT 31 10/30/2018  .   Total Time in preparing paper work, data evaluation and todays exam - 35 minutes  Epifanio Lesches M.D on 11/03/2018 at 10:40 AM    Note: This dictation was prepared with Dragon dictation along with smaller phrase technology. Any transcriptional errors that result from this process are unintentional.

## 2018-11-04 ENCOUNTER — Inpatient Hospital Stay: Payer: PPO

## 2018-11-04 ENCOUNTER — Inpatient Hospital Stay: Payer: PPO | Admitting: Hospice and Palliative Medicine

## 2018-11-04 ENCOUNTER — Inpatient Hospital Stay: Payer: PPO | Admitting: Oncology

## 2018-11-04 ENCOUNTER — Telehealth: Payer: Self-pay

## 2018-11-04 DIAGNOSIS — E785 Hyperlipidemia, unspecified: Secondary | ICD-10-CM | POA: Diagnosis not present

## 2018-11-04 DIAGNOSIS — E669 Obesity, unspecified: Secondary | ICD-10-CM | POA: Diagnosis not present

## 2018-11-04 DIAGNOSIS — Z9181 History of falling: Secondary | ICD-10-CM | POA: Diagnosis not present

## 2018-11-04 DIAGNOSIS — M545 Low back pain: Secondary | ICD-10-CM | POA: Diagnosis not present

## 2018-11-04 DIAGNOSIS — G8929 Other chronic pain: Secondary | ICD-10-CM | POA: Diagnosis not present

## 2018-11-04 DIAGNOSIS — A419 Sepsis, unspecified organism: Secondary | ICD-10-CM | POA: Diagnosis not present

## 2018-11-04 DIAGNOSIS — Z452 Encounter for adjustment and management of vascular access device: Secondary | ICD-10-CM | POA: Diagnosis not present

## 2018-11-04 DIAGNOSIS — D61818 Other pancytopenia: Secondary | ICD-10-CM | POA: Diagnosis not present

## 2018-11-04 DIAGNOSIS — T451X5D Adverse effect of antineoplastic and immunosuppressive drugs, subsequent encounter: Secondary | ICD-10-CM | POA: Diagnosis not present

## 2018-11-04 DIAGNOSIS — A408 Other streptococcal sepsis: Secondary | ICD-10-CM | POA: Diagnosis not present

## 2018-11-04 DIAGNOSIS — C3411 Malignant neoplasm of upper lobe, right bronchus or lung: Secondary | ICD-10-CM | POA: Diagnosis not present

## 2018-11-04 DIAGNOSIS — Z87891 Personal history of nicotine dependence: Secondary | ICD-10-CM | POA: Diagnosis not present

## 2018-11-04 DIAGNOSIS — K7460 Unspecified cirrhosis of liver: Secondary | ICD-10-CM | POA: Diagnosis not present

## 2018-11-04 DIAGNOSIS — M1990 Unspecified osteoarthritis, unspecified site: Secondary | ICD-10-CM | POA: Diagnosis not present

## 2018-11-04 DIAGNOSIS — R6889 Other general symptoms and signs: Secondary | ICD-10-CM | POA: Diagnosis not present

## 2018-11-04 DIAGNOSIS — B952 Enterococcus as the cause of diseases classified elsewhere: Secondary | ICD-10-CM | POA: Diagnosis not present

## 2018-11-04 DIAGNOSIS — L02215 Cutaneous abscess of perineum: Secondary | ICD-10-CM | POA: Diagnosis not present

## 2018-11-04 DIAGNOSIS — F329 Major depressive disorder, single episode, unspecified: Secondary | ICD-10-CM | POA: Diagnosis not present

## 2018-11-04 DIAGNOSIS — Z792 Long term (current) use of antibiotics: Secondary | ICD-10-CM | POA: Diagnosis not present

## 2018-11-04 DIAGNOSIS — D6481 Anemia due to antineoplastic chemotherapy: Secondary | ICD-10-CM | POA: Diagnosis not present

## 2018-11-04 DIAGNOSIS — K439 Ventral hernia without obstruction or gangrene: Secondary | ICD-10-CM | POA: Diagnosis not present

## 2018-11-04 DIAGNOSIS — I10 Essential (primary) hypertension: Secondary | ICD-10-CM | POA: Diagnosis not present

## 2018-11-04 DIAGNOSIS — C7951 Secondary malignant neoplasm of bone: Secondary | ICD-10-CM | POA: Diagnosis not present

## 2018-11-04 NOTE — Telephone Encounter (Signed)
Unable to reach patient for transitional care management. No answer. Will follow as appropriate.

## 2018-11-05 ENCOUNTER — Other Ambulatory Visit: Payer: Self-pay

## 2018-11-05 NOTE — Telephone Encounter (Signed)
Contacted patient for transitional care management. States she is doing well overall and has declined hospital follow up with primary care provider. She plans to follow up with Dr. Evalee Mutton (infection diseases) in 1 week post discharge and cancer center as scheduled.

## 2018-11-06 ENCOUNTER — Inpatient Hospital Stay: Payer: PPO | Attending: Oncology

## 2018-11-06 ENCOUNTER — Inpatient Hospital Stay (HOSPITAL_BASED_OUTPATIENT_CLINIC_OR_DEPARTMENT_OTHER): Payer: PPO | Admitting: Nurse Practitioner

## 2018-11-06 ENCOUNTER — Other Ambulatory Visit: Payer: Self-pay

## 2018-11-06 ENCOUNTER — Encounter: Payer: Self-pay | Admitting: *Deleted

## 2018-11-06 ENCOUNTER — Inpatient Hospital Stay: Payer: PPO

## 2018-11-06 VITALS — BP 149/79 | HR 83 | Temp 96.0°F | Resp 20 | Wt 164.7 lb

## 2018-11-06 DIAGNOSIS — D61818 Other pancytopenia: Secondary | ICD-10-CM

## 2018-11-06 DIAGNOSIS — C7951 Secondary malignant neoplasm of bone: Secondary | ICD-10-CM | POA: Diagnosis not present

## 2018-11-06 DIAGNOSIS — R7881 Bacteremia: Secondary | ICD-10-CM | POA: Diagnosis not present

## 2018-11-06 DIAGNOSIS — Z79899 Other long term (current) drug therapy: Secondary | ICD-10-CM | POA: Insufficient documentation

## 2018-11-06 DIAGNOSIS — K7460 Unspecified cirrhosis of liver: Secondary | ICD-10-CM | POA: Insufficient documentation

## 2018-11-06 DIAGNOSIS — Z95828 Presence of other vascular implants and grafts: Secondary | ICD-10-CM

## 2018-11-06 DIAGNOSIS — Z5112 Encounter for antineoplastic immunotherapy: Secondary | ICD-10-CM | POA: Diagnosis not present

## 2018-11-06 DIAGNOSIS — Z8249 Family history of ischemic heart disease and other diseases of the circulatory system: Secondary | ICD-10-CM | POA: Insufficient documentation

## 2018-11-06 DIAGNOSIS — E876 Hypokalemia: Secondary | ICD-10-CM | POA: Diagnosis not present

## 2018-11-06 DIAGNOSIS — C3411 Malignant neoplasm of upper lobe, right bronchus or lung: Secondary | ICD-10-CM | POA: Insufficient documentation

## 2018-11-06 DIAGNOSIS — Z87891 Personal history of nicotine dependence: Secondary | ICD-10-CM | POA: Diagnosis not present

## 2018-11-06 DIAGNOSIS — I10 Essential (primary) hypertension: Secondary | ICD-10-CM | POA: Insufficient documentation

## 2018-11-06 DIAGNOSIS — D6481 Anemia due to antineoplastic chemotherapy: Secondary | ICD-10-CM

## 2018-11-06 DIAGNOSIS — B955 Unspecified streptococcus as the cause of diseases classified elsewhere: Secondary | ICD-10-CM

## 2018-11-06 DIAGNOSIS — E785 Hyperlipidemia, unspecified: Secondary | ICD-10-CM | POA: Insufficient documentation

## 2018-11-06 DIAGNOSIS — Z791 Long term (current) use of non-steroidal anti-inflammatories (NSAID): Secondary | ICD-10-CM | POA: Insufficient documentation

## 2018-11-06 DIAGNOSIS — E669 Obesity, unspecified: Secondary | ICD-10-CM | POA: Diagnosis not present

## 2018-11-06 DIAGNOSIS — K219 Gastro-esophageal reflux disease without esophagitis: Secondary | ICD-10-CM | POA: Insufficient documentation

## 2018-11-06 DIAGNOSIS — F329 Major depressive disorder, single episode, unspecified: Secondary | ICD-10-CM | POA: Insufficient documentation

## 2018-11-06 LAB — COMPREHENSIVE METABOLIC PANEL
ALT: 19 U/L (ref 0–44)
AST: 19 U/L (ref 15–41)
Albumin: 3.6 g/dL (ref 3.5–5.0)
Alkaline Phosphatase: 126 U/L (ref 38–126)
Anion gap: 8 (ref 5–15)
BUN: 6 mg/dL — ABNORMAL LOW (ref 8–23)
CO2: 25 mmol/L (ref 22–32)
Calcium: 7.2 mg/dL — ABNORMAL LOW (ref 8.9–10.3)
Chloride: 109 mmol/L (ref 98–111)
Creatinine, Ser: 0.44 mg/dL (ref 0.44–1.00)
GFR calc Af Amer: >60 mL/min (ref >60)
GFR calc non Af Amer: >60 mL/min (ref >60)
Glucose, Bld: 97 mg/dL (ref 70–99)
Potassium: 3.1 mmol/L — ABNORMAL LOW (ref 3.5–5.1)
Sodium: 142 mmol/L (ref 135–145)
Total Bilirubin: 0.6 mg/dL (ref 0.3–1.2)
Total Protein: 6.9 g/dL (ref 6.5–8.1)

## 2018-11-06 LAB — CBC WITH DIFFERENTIAL/PLATELET
Abs Immature Granulocytes: 0.92 10*3/uL — ABNORMAL HIGH (ref 0.00–0.07)
Basophils Absolute: 0 10*3/uL (ref 0.0–0.1)
Basophils Relative: 1 %
Eosinophils Absolute: 0 10*3/uL (ref 0.0–0.5)
Eosinophils Relative: 0 %
HCT: 29.6 % — ABNORMAL LOW (ref 36.0–46.0)
Hemoglobin: 9.6 g/dL — ABNORMAL LOW (ref 12.0–15.0)
Immature Granulocytes: 15 %
Lymphocytes Relative: 10 %
Lymphs Abs: 0.6 10*3/uL — ABNORMAL LOW (ref 0.7–4.0)
MCH: 29.5 pg (ref 26.0–34.0)
MCHC: 32.4 g/dL (ref 30.0–36.0)
MCV: 91.1 fL (ref 80.0–100.0)
Monocytes Absolute: 0.7 10*3/uL (ref 0.1–1.0)
Monocytes Relative: 11 %
Neutro Abs: 4 10*3/uL (ref 1.7–7.7)
Neutrophils Relative %: 63 %
Platelets: 95 10*3/uL — ABNORMAL LOW (ref 150–400)
RBC: 3.25 MIL/uL — ABNORMAL LOW (ref 3.87–5.11)
RDW: 20.3 % — ABNORMAL HIGH (ref 11.5–15.5)
Smear Review: NORMAL
WBC: 6.3 10*3/uL (ref 4.0–10.5)
nRBC: 0.5 % — ABNORMAL HIGH (ref 0.0–0.2)

## 2018-11-06 LAB — MAGNESIUM: Magnesium: 1.5 mg/dL — ABNORMAL LOW (ref 1.7–2.4)

## 2018-11-06 LAB — SAMPLE TO BLOOD BANK

## 2018-11-06 MED ORDER — HEPARIN SOD (PORK) LOCK FLUSH 100 UNIT/ML IV SOLN
INTRAVENOUS | Status: AC
  Filled 2018-11-06: qty 5

## 2018-11-06 MED ORDER — HEPARIN SOD (PORK) LOCK FLUSH 100 UNIT/ML IV SOLN: 500.0000 [IU] | Freq: Once | INTRAVENOUS | Status: DC

## 2018-11-06 MED ORDER — SODIUM CHLORIDE 0.9 % IV SOLN
INTRAVENOUS | Status: DC
  Administered 2018-11-06: 11:00:00 via INTRAVENOUS
  Filled 2018-11-06: qty 250

## 2018-11-06 MED ORDER — SODIUM CHLORIDE 0.9% FLUSH
10.0000 mL | Freq: Once | INTRAVENOUS | Status: AC
  Administered 2018-11-06: 10 mL via INTRAVENOUS
  Filled 2018-11-06: qty 10

## 2018-11-06 MED ORDER — SODIUM CHLORIDE 0.9 % IV SOLN
Freq: Once | INTRAVENOUS | Status: AC
  Administered 2018-11-06: 11:00:00 via INTRAVENOUS
  Filled 2018-11-06: qty 1000

## 2018-11-06 MED ORDER — MAGNESIUM SULFATE 2 GM/50ML IV SOLN
2.0000 g | Freq: Once | INTRAVENOUS | Status: AC
  Administered 2018-11-06: 12:00:00 2 g via INTRAVENOUS
  Filled 2018-11-06: qty 50

## 2018-11-06 MED ORDER — SODIUM CHLORIDE 0.9 % IV SOLN: 2.0000 g | Freq: Once | INTRAVENOUS | Status: DC

## 2018-11-06 NOTE — Progress Notes (Signed)
Symptom Management [place]  [phone] [phone]  Patient Care Team: Leone Haven, MD as PCP - General (Family Medicine) Leone Haven, MD as Consulting Physician (Family Medicine) Bary Castilla, Forest Gleason, MD (General Surgery) Telford Nab, RN as Registered Nurse   Name of the patient: Anne [name]  [id number]  1950-06-02   Date of visit: 11/06/18  Diagnosis- small cell lung cancer  Chief complaint/ Reason for visit- hospital f/u for streptococcal bacteremia  Heme/Onc history:  Oncology History  Small cell lung cancer, right upper lobe ([place])  08/05/2018 Cancer Staging   Staging form: Lung, AJCC 8th Edition - Clinical stage from 08/05/2018: Stage IVB (cT3, cN2, cM1c) - Signed by Sindy Guadeloupe, MD on 08/06/2018   08/06/2018 Initial Diagnosis   Small cell lung cancer, right upper lobe ([place])   08/18/2018 -  Chemotherapy   The patient had palonosetron (ALOXI) injection 0.25 mg, 0.25 mg, Intravenous,  Once, 4 of 4 cycles Administration: 0.25 mg (08/18/2018), 0.25 mg (09/09/2018), 0.25 mg (09/30/2018), 0.25 mg (10/21/2018) pegfilgrastim (NEULASTA ONPRO KIT) injection 6 mg, 6 mg, Subcutaneous, Once, 4 of 4 cycles Administration: 6 mg (08/20/2018), 6 mg (09/11/2018), 6 mg (10/02/2018), 6 mg (10/23/2018) CARBOplatin (PARAPLATIN) 460 mg in sodium chloride 0.9 % 250 mL chemo infusion, 460 mg (100 % of original dose 462.5 mg), Intravenous,  Once, 4 of 4 cycles Dose modification:   (original dose 462.5 mg, Cycle 1) Administration: 460 mg (08/18/2018), 460 mg (09/09/2018), 460 mg (09/30/2018), 460 mg (10/21/2018) etoposide (VEPESID) 190 mg in sodium chloride 0.9 % 500 mL chemo infusion, 100 mg/m2 = 190 mg, Intravenous,  Once, 4 of 4 cycles Administration: 190 mg (08/18/2018), 190 mg (08/19/2018), 190 mg (08/20/2018), 190 mg (09/09/2018), 190 mg (09/10/2018), 190 mg (09/11/2018), 190 mg (09/30/2018), 190 mg (10/01/2018), 190 mg (10/02/2018), 190 mg (10/21/2018),  190 mg (10/22/2018), 190 mg (10/23/2018) fosaprepitant (EMEND) 150 mg, dexamethasone (DECADRON) 12 mg in sodium chloride 0.9 % 145 mL IVPB, , Intravenous,  Once, 4 of 4 cycles Administration:  (08/18/2018),  (09/09/2018),  (09/30/2018),  (10/21/2018) atezolizumab (TECENTRIQ) 1,200 mg in sodium chloride 0.9 % 250 mL chemo infusion, 1,200 mg, Intravenous, Once, 4 of 8 cycles Administration: 1,200 mg (08/18/2018), 1,200 mg (09/09/2018), 1,200 mg (09/30/2018), 1,200 mg (10/21/2018)  for chemotherapy treatment.      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  She was found to have a left perineal abscess.  That was thought to be source of infection.  In ER she met sepsis criteria with tachycardia, tachypnea, leukopenia, and lactic acidosis.  Labs were significant for alkaline phosphatase 158, AST 60, ALT 47, total bilirubin 2.3, lactic acid 3.0, procalcitonin 23, WBC 0.8, hemoglobin 6.2, platelets 28.  UA with moderate LE, negative nitrites, many bacteria.  She was started on vancomycin and cefepime.  Blood culture positive for Streptococcus. She was started on Silvadene cream to wound. She was discharged on 11/03/2018.    Today, she says that the wound feels better and is less painful. She feels less weak than previously and no nausea or vomiting. She is receiving home health services with [facility] and [facility] for IV cefin.  She is scheduled to receive daily infusions through 11/12/2018.  She continues oral Flagyl 500 mg p.o. twice daily for 7  days.  She says she was asked to give the antibiotics to herself yesterday which she was not comfortable doing but she did administer the infusion.  Says overall she is feeling better since her hospitalization.  No  dizziness or weakness.  No fever or chills.  Appetite is good and denies weight loss.  No chest pain.  No constipation, diarrhea.  No urinary complaints.    ECOG FS:1 - Symptomatic but completely ambulatory  Review of systems- Review of Systems  Constitutional: Negative for chills, fever, malaise/fatigue and weight loss.  HENT: Negative for hearing loss, nosebleeds, sore throat and tinnitus.   Eyes: Negative for blurred vision and double vision.  Respiratory: Negative for cough, hemoptysis, shortness of breath and wheezing.   Cardiovascular: Negative for chest pain, palpitations and leg swelling.  Gastrointestinal: Negative for abdominal pain, blood in stool, constipation, diarrhea, melena, nausea and vomiting.  Genitourinary: Negative for dysuria and urgency.  Musculoskeletal: Negative for back pain, falls, joint pain and myalgias.  Skin: Negative for itching and rash.  Neurological: Negative for dizziness, tingling, sensory change, loss of consciousness, weakness and headaches.  Endo/Heme/Allergies: Negative for environmental allergies. Does not bruise/bleed easily.  Psychiatric/Behavioral: Negative for depression. The patient is not nervous/anxious and does not have insomnia.      Current treatment- carbo-etoposide-tecentriq (last 10/21/2018- 10/23/2018)  Allergies  Allergen Reactions   Bee Venom Swelling    Past Medical History:  Diagnosis Date   Arthritis    Cirrhosis ([place])    Depression    GERD (gastroesophageal reflux disease)    Hyperlipidemia    Hypertension    Lung cancer ([place])    Metastatic bone cancer ([place])     Past Surgical History:  Procedure Laterality Date   APPENDECTOMY  1971   CHOLECYSTECTOMY  1971   COLONOSCOPY WITH PROPOFOL N/A 11/15/2016   Procedure: COLONOSCOPY WITH PROPOFOL;  Surgeon: Jonathon Bellows, MD;  Location: [facility] ENDOSCOPY;  Service: Gastroenterology;  Laterality: N/A;   ENDOBRONCHIAL ULTRASOUND Right 07/30/2018   Procedure:  ENDOBRONCHIAL ULTRASOUND;  Surgeon: Laverle Hobby, MD;  Location: [facility] ORS;  Service: Pulmonary;  Laterality: Right;   ESOPHAGOGASTRODUODENOSCOPY (EGD) WITH PROPOFOL N/A 01/07/2017   Procedure: ESOPHAGOGASTRODUODENOSCOPY (EGD) WITH PROPOFOL;  Surgeon: Jonathon Bellows, MD;  Location: [facility] ENDOSCOPY;  Service: Gastroenterology;  Laterality: N/A;   LAPAROSCOPY N/A 03/01/2017   Procedure: LAPAROSCOPY DIAGNOSTIC;  Surgeon: Robert Bellow, MD;  Location: [facility] ORS;  Service: General;  Laterality: N/A;   PORTA CATH INSERTION N/A 08/14/2018   Procedure: PORTA CATH INSERTION;  Surgeon: Algernon Huxley, MD;  Location: [facility] LAB;  Service: Cardiovascular;  Laterality: N/A;   TONSILECTOMY, ADENOIDECTOMY, BILATERAL MYRINGOTOMY AND TUBES  1955   TONSILLECTOMY     VENTRAL HERNIA REPAIR N/A 03/01/2017   10 x 14 CM Ventralight ST mesh, intraperitoneal location.    VENTRAL HERNIA REPAIR N/A 03/01/2017   Procedure: HERNIA REPAIR VENTRAL ADULT;  Surgeon: Robert Bellow, MD;  Location: [facility] ORS;  Service: General;  Laterality: N/A;    Social History   Socioeconomic History   Marital status: Married    Spouse name: Johnny   Number of children: Not on file   Years of education: Not on file   Highest education level: Not on file  Occupational History   Not on file  Social Needs   Financial resource strain: Not hard at all   Food insecurity    Worry: Never true    Inability: Never true   Transportation needs  Medical: No    Non-medical: No  Tobacco Use   Smoking status: Former Smoker    Packs/day: 2.00    Years: 50.00    Pack years: 100.00    Types: Cigarettes, E-cigarettes    Quit date: 02/07/2012    Years since quitting: 6.7   Smokeless tobacco: Never Used  Substance and Sexual Activity   Alcohol use: No    Alcohol/week: 0.0 standard drinks   Drug use: No   Sexual activity: Yes  Lifestyle   Physical activity    Days per week: Patient refused    Minutes per  session: Patient refused   Stress: Not at all  Relationships   Social connections    Talks on phone: Patient refused    Gets together: Patient refused    Attends religious service: Patient refused    Active member of club or organization: Patient refused    Attends meetings of clubs or organizations: Patient refused    Relationship status: Patient refused   Intimate partner violence    Fear of current or ex partner: No    Emotionally abused: No    Physically abused: No    Forced sexual activity: No  Other Topics Concern   Not on file  Social History Narrative   Married   Retired   Clinical cytogeneticist level of education    No children    1 cup of coffee    Family History  Problem Relation Age of Onset   Hypertension Mother    Ovarian cancer Mother 58   Heart disease Father    Stroke Father    Ovarian cancer Sister        ? dx cancer had hyst.   Breast cancer Neg Hx      Current Outpatient Medications:    buPROPion (WELLBUTRIN XL) 300 MG 24 hr tablet, Take 1 tablet by mouth once daily (Patient taking differently: Take 300 mg by mouth daily. ), Disp: 90 tablet, Rfl: 0   cefTRIAXone 2 g in sodium chloride 0.9 % 100 mL, Inject 2 g into the vein daily for 9 days., Disp: 9 Doses/Fill, Rfl: 0   escitalopram (LEXAPRO) 10 MG tablet, Take 1 tablet (10 mg total) by mouth daily., Disp: 90 tablet, Rfl: 1   feeding supplement, ENSURE ENLIVE, (ENSURE ENLIVE) LIQD, Take 237 mLs by mouth 2 (two) times daily between meals., Disp: 237 mL, Rfl: 12   folic acid (FOLVITE) 1 MG tablet, Take 1 tablet (1 mg total) by mouth daily., Disp: 30 tablet, Rfl: 3   ibuprofen (ADVIL,MOTRIN) 200 MG tablet, Take 400 mg by mouth every 8 (eight) hours as needed for headache or moderate pain. , Disp: , Rfl:    lidocaine-prilocaine (EMLA) cream, Apply to affected area once, Disp: 30 g, Rfl: 3   loperamide (IMODIUM) 2 MG capsule, Take 1 capsule (2 mg total) by mouth every 2 (two) hours as needed  for diarrhea or loose stools. Do not exceed 29m daily., Disp: 30 capsule, Rfl: 1   LORazepam (ATIVAN) 0.5 MG tablet, Take 1 tablet (0.5 mg total) by mouth every 6 (six) hours as needed (Nausea or vomiting)., Disp: 30 tablet, Rfl: 0   metroNIDAZOLE (FLAGYL) 500 MG tablet, Take 1 tablet (500 mg total) by mouth every 12 (twelve) hours for 14 doses., Disp: 14 tablet, Rfl: 0   Multiple Vitamins-Minerals (PRESERVISION AREDS 2) CAPS, Take 1 capsule by mouth 2 (two) times a day. , Disp: , Rfl:    multivitamin-lutein (OCUVITE-LUTEIN) CAPS capsule,  Take 1 capsule by mouth daily., Disp: 30 capsule, Rfl: 0   ondansetron (ZOFRAN) 8 MG tablet, Take 1 tablet (8 mg total) by mouth 2 (two) times daily as needed for refractory nausea / vomiting. Start on day 3 after carboplatin chemo., Disp: 30 tablet, Rfl: 1   pantoprazole (PROTONIX) 40 MG tablet, TAKE 1 TABLET BY MOUTH TWICE DAILY BEFORE A MEAL (Patient taking differently: Take 40 mg by mouth 2 (two) times daily before a meal. ), Disp: 180 tablet, Rfl: 0   polyethylene glycol (MIRALAX / GLYCOLAX) 17 g packet, Take 17 g by mouth daily as needed for mild constipation., Disp: 14 each, Rfl: 0   potassium chloride SA (K-DUR) 20 MEQ tablet, Take 1 tablet (20 mEq total) by mouth 2 (two) times daily., Disp: 20 tablet, Rfl: 0   prochlorperazine (COMPAZINE) 10 MG tablet, Take 1 tablet (10 mg total) by mouth every 6 (six) hours as needed (Nausea or vomiting)., Disp: 30 tablet, Rfl: 1   rosuvastatin (CRESTOR) 20 MG tablet, TAKE 1 TABLET BY MOUTH ONCE DAILY (Patient taking differently: Take 20 mg by mouth daily. ), Disp: 90 tablet, Rfl: 3   silver sulfADIAZINE (SILVADENE) 1 % cream, Apply topically 2 (two) times daily., Disp: 50 g, Rfl: 0  Physical exam:  Vitals:   11/06/18 0849  BP: (!) 149/79  Pulse: 83  Resp: 20  Temp: (!) 96 F (35.6 C)  TempSrc: Tympanic  SpO2: 100%  Weight: 164 lb 11.2 oz (74.7 kg)   Physical Exam Constitutional:      General: She is  not in acute distress.    Appearance: She is well-developed.  HENT:     Head: Atraumatic.     Nose: Nose normal.     Mouth/Throat:     Pharynx: No oropharyngeal exudate.  Eyes:     General: No scleral icterus.    Conjunctiva/sclera: Conjunctivae normal.     Pupils: Pupils are equal, round, and reactive to light.  Neck:     Musculoskeletal: Normal range of motion and neck supple.  Cardiovascular:     Rate and Rhythm: Normal rate and regular rhythm.  Pulmonary:     Effort: Pulmonary effort is normal.     Breath sounds: Normal breath sounds.  Abdominal:     General: Bowel sounds are normal. There is no distension.     Palpations: Abdomen is soft.  Musculoskeletal: Normal range of motion.  Skin:    General: Skin is warm and dry.  Neurological:     Mental Status: She is alert and oriented to person, place, and time.  Psychiatric:        Mood and Affect: Mood normal.        Behavior: Behavior normal.      CMP Latest Ref Rng & Units 11/06/2018  Glucose 70 - 99 mg/dL 97  BUN 8 - 23 mg/dL 6(L)  Creatinine 0.44 - 1.00 mg/dL 0.44  Sodium 135 - 145 mmol/L 142  Potassium 3.5 - 5.1 mmol/L 3.1(L)  Chloride 98 - 111 mmol/L 109  CO2 22 - 32 mmol/L 25  Calcium 8.9 - 10.3 mg/dL 7.2(L)  Total Protein 6.5 - 8.1 g/dL 6.9  Total Bilirubin 0.3 - 1.2 mg/dL 0.6  Alkaline Phos 38 - 126 U/L 126  AST 15 - 41 U/L 19  ALT 0 - 44 U/L 19   CBC Latest Ref Rng & Units 11/06/2018  WBC 4.0 - 10.5 K/uL 6.3  Hemoglobin 12.0 - 15.0 g/dL 9.6(L)  Hematocrit 36.0 -  46.0 % 29.6(L)  Platelets 150 - 400 K/uL 95(L)    No images are attached to the encounter.  Ct Head Wo Contrast  Result Date: 10/15/2018 CLINICAL DATA:  Altered level of consciousness EXAM: CT HEAD WITHOUT CONTRAST TECHNIQUE: Contiguous axial images were obtained from the base of the skull through the vertex without intravenous contrast. COMPARISON:  None. FINDINGS: Brain: No evidence of acute infarction, hemorrhage, extra-axial collection,  ventriculomegaly, or mass effect. Generalized cerebral atrophy. Periventricular white matter low attenuation likely secondary to microangiopathy. Vascular: Cerebrovascular atherosclerotic calcifications are noted. Skull: Negative for fracture or focal lesion. Sinuses/Orbits: Visualized portions of the orbits are unremarkable. Visualized portions of the paranasal sinuses and mastoid air cells are unremarkable. Other: None. IMPRESSION: No acute intracranial pathology. Electronically Signed   By: Kathreen Devoid   On: 10/15/2018 11:28   US Abdomen Limited Ruq  Result Date: 10/30/2018 CLINICAL DATA:  Elevated LFTs EXAM: ULTRASOUND ABDOMEN LIMITED RIGHT UPPER QUADRANT COMPARISON:  CT abdomen and pelvis 09/22/2018 FINDINGS: Gallbladder: Surgically absent Common bile duct: Diameter: 4 mm diameter, normal Liver: Mildly heterogeneous upper normal echogenicity of liver with nodular contours consistent with cirrhosis. No discrete hepatic mass lesion identified, though assessment of intrahepatic detail is slightly limited due to sound attenuation. Portal vein is patent on color Doppler imaging with normal direction of blood flow towards the liver. Other: No RIGHT upper quadrant free fluid. IMPRESSION: Post cholecystectomy. Cirrhotic appearing liver without definite mass. Electronically Signed   By: Lavonia Dana M.D.   On: 10/30/2018 09:57    Assessment and plan- Patient is a 68 y.o. female with extensive stage lung cancer and bone metastasis who presents to symptom management clinic for follow-up for hospitalization for bacteremia.  1. Streptococcal bacteremia with enterococci contaminant-4 out of 4 blood cultures grew Streptococcus.  Source is wound on left buttock/labia s/p hospitalization, discharged on 11/03/2018. Plan to continue IV rochephin till 11/12/2018 and flagyl 500 mg PO BID x 7 days. Improving.  Afebrile.  Labs in clinic today reviewed with patient.  Mild left shift CBC today.  IV fluids in clinic today  continue antibiotics as prescribed.  2. Central Line infusions- I spoke to home health nurse who says they typically provide education to patient and allow patient to self administer medications. Patient was not comfortable with teaching she had received. Provided 40 minutes of education regarding proper administration and safety of infusing ceftin in clinic today. Discussed with home health nurse, Brittany/[facility] who will reach out to patient.   3.  Pancytopenia-hemoglobin 9.6, platelets 95, WBCs 6.3, ANC 4.0.  Likely secondary to chemotherapy worsened by bacteremia..  Received blood transfusions while hospitalized.   4. Extensive stage lung cancer with bone metastases-status post 4 cycles of carboplatin-etoposide-Tecentriq.  Managed by Dr. Janese Banks  5. Hypomagnesemia-low magnesium today likely secondary to diarrhea.  2 g IV magnesium sulfate in clinic today.  6.  Hypocalcemia-calcium 7.2 today.  IV supplementation in clinic today.  7.  Hypokalemia-potassium 3.1 today. IV supplementation in clinic today.   Disposition: Follow-up with Dr. Janese Banks on 11/17/2018 for labs, further evaluation with Dr. Janese Banks, and consideration of IV fluids & electrolytes Infusions as prescribed. Return to clinic as needed or if symptoms worsen or do not improve.  Visit Diagnosis 1. Streptococcal bacteremia   2. S/P PICC central line placement   3. Other pancytopenia ([place])   4. Hypomagnesemia   5. Hypocalcemia   6. Hypokalemia     Patient expressed understanding and was in agreement with this plan. She  also understands that She can call clinic at any time with any questions, concerns, or complaints.   Thank you for allowing me to participate in the care of this very pleasant patient.   Beckey Rutter, DNP, AGNP-C [place] at [facility] [phone] (work cell) [phone] (office)

## 2018-11-07 LAB — CULTURE, BLOOD (ROUTINE X 2)
Culture: NO GROWTH
Culture: NO GROWTH
Special Requests: ADEQUATE

## 2018-11-10 ENCOUNTER — Encounter
Admission: RE | Admit: 2018-11-10 | Discharge: 2018-11-10 | Disposition: A | Discharge status: Home / Self Care | Payer: PPO | Source: Ambulatory Visit | Attending: Oncology | Admitting: Oncology

## 2018-11-10 ENCOUNTER — Ambulatory Visit
Admission: RE | Admit: 2018-11-10 | Discharge: 2018-11-10 | Disposition: A | Discharge status: Home / Self Care | Payer: PPO | Source: Ambulatory Visit | Attending: Oncology | Admitting: Oncology

## 2018-11-10 ENCOUNTER — Other Ambulatory Visit: Payer: Self-pay

## 2018-11-10 DIAGNOSIS — C3411 Malignant neoplasm of upper lobe, right bronchus or lung: Secondary | ICD-10-CM

## 2018-11-10 DIAGNOSIS — C3490 Malignant neoplasm of unspecified part of unspecified bronchus or lung: Secondary | ICD-10-CM | POA: Diagnosis not present

## 2018-11-10 DIAGNOSIS — C7951 Secondary malignant neoplasm of bone: Secondary | ICD-10-CM | POA: Diagnosis not present

## 2018-11-10 DIAGNOSIS — C7931 Secondary malignant neoplasm of brain: Secondary | ICD-10-CM | POA: Diagnosis not present

## 2018-11-10 MED ORDER — TECHNETIUM TC 99M MEDRONATE IV KIT
20.0000 | PACK | Freq: Once | INTRAVENOUS | Status: AC | PRN
  Administered 2018-11-10: 23.733 via INTRAVENOUS

## 2018-11-10 MED ORDER — IOHEXOL 300 MG/ML  SOLN
100.0000 mL | Freq: Once | INTRAMUSCULAR | Status: AC | PRN
  Administered 2018-11-10: 11:00:00 100 mL via INTRAVENOUS

## 2018-11-11 DIAGNOSIS — T451X5D Adverse effect of antineoplastic and immunosuppressive drugs, subsequent encounter: Secondary | ICD-10-CM | POA: Diagnosis not present

## 2018-11-11 DIAGNOSIS — Z792 Long term (current) use of antibiotics: Secondary | ICD-10-CM | POA: Diagnosis not present

## 2018-11-11 DIAGNOSIS — E785 Hyperlipidemia, unspecified: Secondary | ICD-10-CM | POA: Diagnosis not present

## 2018-11-11 DIAGNOSIS — Z9181 History of falling: Secondary | ICD-10-CM | POA: Diagnosis not present

## 2018-11-11 DIAGNOSIS — I10 Essential (primary) hypertension: Secondary | ICD-10-CM | POA: Diagnosis not present

## 2018-11-11 DIAGNOSIS — M1990 Unspecified osteoarthritis, unspecified site: Secondary | ICD-10-CM | POA: Diagnosis not present

## 2018-11-11 DIAGNOSIS — F329 Major depressive disorder, single episode, unspecified: Secondary | ICD-10-CM | POA: Diagnosis not present

## 2018-11-11 DIAGNOSIS — K7460 Unspecified cirrhosis of liver: Secondary | ICD-10-CM | POA: Diagnosis not present

## 2018-11-11 DIAGNOSIS — M545 Low back pain: Secondary | ICD-10-CM | POA: Diagnosis not present

## 2018-11-11 DIAGNOSIS — L02215 Cutaneous abscess of perineum: Secondary | ICD-10-CM | POA: Diagnosis not present

## 2018-11-11 DIAGNOSIS — B952 Enterococcus as the cause of diseases classified elsewhere: Secondary | ICD-10-CM | POA: Diagnosis not present

## 2018-11-11 DIAGNOSIS — K439 Ventral hernia without obstruction or gangrene: Secondary | ICD-10-CM | POA: Diagnosis not present

## 2018-11-11 DIAGNOSIS — A408 Other streptococcal sepsis: Secondary | ICD-10-CM | POA: Diagnosis not present

## 2018-11-11 DIAGNOSIS — D61818 Other pancytopenia: Secondary | ICD-10-CM | POA: Diagnosis not present

## 2018-11-11 DIAGNOSIS — Z87891 Personal history of nicotine dependence: Secondary | ICD-10-CM | POA: Diagnosis not present

## 2018-11-11 DIAGNOSIS — E669 Obesity, unspecified: Secondary | ICD-10-CM | POA: Diagnosis not present

## 2018-11-11 DIAGNOSIS — C3411 Malignant neoplasm of upper lobe, right bronchus or lung: Secondary | ICD-10-CM | POA: Diagnosis not present

## 2018-11-11 DIAGNOSIS — D6481 Anemia due to antineoplastic chemotherapy: Secondary | ICD-10-CM | POA: Diagnosis not present

## 2018-11-11 DIAGNOSIS — Z452 Encounter for adjustment and management of vascular access device: Secondary | ICD-10-CM | POA: Diagnosis not present

## 2018-11-11 DIAGNOSIS — C7951 Secondary malignant neoplasm of bone: Secondary | ICD-10-CM | POA: Diagnosis not present

## 2018-11-11 DIAGNOSIS — G8929 Other chronic pain: Secondary | ICD-10-CM | POA: Diagnosis not present

## 2018-11-13 ENCOUNTER — Other Ambulatory Visit: Payer: Self-pay | Admitting: Infectious Diseases

## 2018-11-14 ENCOUNTER — Telehealth: Payer: Self-pay | Admitting: Licensed Clinical Social Worker

## 2018-11-14 ENCOUNTER — Encounter: Payer: Self-pay | Admitting: Oncology

## 2018-11-14 NOTE — Telephone Encounter (Signed)
I spoke with the patient and she states that she is feeling "wonderful" and that she is done with her antibiotics. She was getting antibiotics through her port that she already had. She did not schedule a follow up for next week because she feels that she does not need one at this time. She says she has a lot of appointments at the [facility] and would like a phone call instead of a follow up visit in the office.

## 2018-11-14 NOTE — Progress Notes (Signed)
Patient reports 1-2 episodes of diarrhea a day.

## 2018-11-17 ENCOUNTER — Inpatient Hospital Stay: Payer: PPO

## 2018-11-17 ENCOUNTER — Other Ambulatory Visit: Payer: Self-pay

## 2018-11-17 ENCOUNTER — Encounter: Payer: Self-pay | Admitting: *Deleted

## 2018-11-17 ENCOUNTER — Inpatient Hospital Stay (HOSPITAL_BASED_OUTPATIENT_CLINIC_OR_DEPARTMENT_OTHER): Payer: PPO | Admitting: Oncology

## 2018-11-17 VITALS — BP 115/78 | HR 89 | Temp 98.7°F | Wt 162.0 lb

## 2018-11-17 DIAGNOSIS — Z5112 Encounter for antineoplastic immunotherapy: Secondary | ICD-10-CM

## 2018-11-17 DIAGNOSIS — C7951 Secondary malignant neoplasm of bone: Secondary | ICD-10-CM | POA: Diagnosis not present

## 2018-11-17 DIAGNOSIS — C3411 Malignant neoplasm of upper lobe, right bronchus or lung: Secondary | ICD-10-CM | POA: Diagnosis not present

## 2018-11-17 LAB — COMPREHENSIVE METABOLIC PANEL
ALT: 10 U/L (ref 0–44)
AST: 18 U/L (ref 15–41)
Albumin: 3.8 g/dL (ref 3.5–5.0)
Alkaline Phosphatase: 122 U/L (ref 38–126)
Anion gap: 6 (ref 5–15)
BUN: 7 mg/dL — ABNORMAL LOW (ref 8–23)
CO2: 24 mmol/L (ref 22–32)
Calcium: 8 mg/dL — ABNORMAL LOW (ref 8.9–10.3)
Chloride: 107 mmol/L (ref 98–111)
Creatinine, Ser: 0.63 mg/dL (ref 0.44–1.00)
GFR calc Af Amer: >60 mL/min (ref >60)
GFR calc non Af Amer: >60 mL/min (ref >60)
Glucose, Bld: 93 mg/dL (ref 70–99)
Potassium: 4.2 mmol/L (ref 3.5–5.1)
Sodium: 137 mmol/L (ref 135–145)
Total Bilirubin: 0.6 mg/dL (ref 0.3–1.2)
Total Protein: 7.1 g/dL (ref 6.5–8.1)

## 2018-11-17 LAB — CBC WITH DIFFERENTIAL/PLATELET
Abs Immature Granulocytes: 0.02 10*3/uL (ref 0.00–0.07)
Basophils Absolute: 0.1 10*3/uL (ref 0.0–0.1)
Basophils Relative: 2 %
Eosinophils Absolute: 0 10*3/uL (ref 0.0–0.5)
Eosinophils Relative: 0 %
HCT: 31.9 % — ABNORMAL LOW (ref 36.0–46.0)
Hemoglobin: 9.9 g/dL — ABNORMAL LOW (ref 12.0–15.0)
Immature Granulocytes: 1 %
Lymphocytes Relative: 20 %
Lymphs Abs: 0.7 10*3/uL (ref 0.7–4.0)
MCH: 29.9 pg (ref 26.0–34.0)
MCHC: 31 g/dL (ref 30.0–36.0)
MCV: 96.4 fL (ref 80.0–100.0)
Monocytes Absolute: 0.3 10*3/uL (ref 0.1–1.0)
Monocytes Relative: 10 %
Neutro Abs: 2.4 10*3/uL (ref 1.7–7.7)
Neutrophils Relative %: 67 %
Platelets: 166 10*3/uL (ref 150–400)
RBC: 3.31 MIL/uL — ABNORMAL LOW (ref 3.87–5.11)
RDW: 21.3 % — ABNORMAL HIGH (ref 11.5–15.5)
WBC: 3.5 10*3/uL — ABNORMAL LOW (ref 4.0–10.5)
nRBC: 0 % (ref 0.0–0.2)

## 2018-11-17 MED ORDER — SODIUM CHLORIDE 0.9 % IV SOLN
Freq: Once | INTRAVENOUS | Status: AC
  Administered 2018-11-17: 10:00:00 via INTRAVENOUS
  Filled 2018-11-17: qty 250

## 2018-11-17 MED ORDER — HEPARIN SOD (PORK) LOCK FLUSH 100 UNIT/ML IV SOLN
500.0000 [IU] | Freq: Once | INTRAVENOUS | Status: AC
  Administered 2018-11-17: 500 [IU] via INTRAVENOUS
  Filled 2018-11-17: qty 5

## 2018-11-17 MED ORDER — SODIUM CHLORIDE 0.9% FLUSH
10.0000 mL | Freq: Once | INTRAVENOUS | Status: AC
  Administered 2018-11-17: 10 mL via INTRAVENOUS
  Filled 2018-11-17: qty 10

## 2018-11-17 MED ORDER — SODIUM CHLORIDE 0.9 % IV SOLN
1200.0000 mg | Freq: Once | INTRAVENOUS | Status: AC
  Administered 2018-11-17: 1200 mg via INTRAVENOUS
  Filled 2018-11-17: qty 20

## 2018-11-17 NOTE — Progress Notes (Signed)
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 She has been referred to [place] for findings of bone metastases and her recent MRI. She has a prior history of 3 packs/day day smoking for over 45 years and quit smoking 5 years ago.She had a CT chest abdomen pelvis in 2018 which showed a 5 mm lung nodule in the left lower lobe. Recently over the last 2 months patient has been having worsening back pain and was referred to orthopedics. 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 Pathologic fracture of S1.  PET scan showed 2 RUL lung nodules, hilar and mediastinal adenopathy and widespread bone mets. MRI brain negative.  Patientcompletedpalliative RT to her spine. Bronchoscopy showed small cell lung cancer. Palliative carboplatin, etoposide and Tecentriq started on 08/18/2018   Interval history-overall patient feels much better since her hospitalization.  She has completed her course of IV antibiotics.  Appetite is fair and weight is overall stable.  She denies any particular complaints at this time  ECOG PS- 1 Pain scale-  0   Review of systems- Review of Systems  Constitutional: Negative for chills, fever, malaise/fatigue and weight loss.  HENT: Negative for congestion, ear discharge and nosebleeds.   Eyes: Negative for blurred vision.  Respiratory: Negative for cough, hemoptysis, sputum production, shortness of breath and wheezing.   Cardiovascular: Negative for chest pain, palpitations, orthopnea and claudication.  Gastrointestinal: Negative for abdominal pain, blood in stool, constipation, diarrhea, heartburn, melena, nausea and vomiting.  Genitourinary: Negative for dysuria, flank pain, frequency, hematuria and urgency.  Musculoskeletal: Negative for back pain, joint pain and myalgias.  Skin: Negative for rash.  Neurological: Negative for dizziness, tingling, focal weakness, seizures, weakness and headaches.  Endo/Heme/Allergies: Does not bruise/bleed easily.  Psychiatric/Behavioral: Negative for depression and suicidal ideas. The patient does not have insomnia.       Allergies  Allergen Reactions   Bee Venom Swelling     Past Medical History:  Diagnosis Date   Arthritis    Cirrhosis ([place])    Depression    GERD (gastroesophageal reflux disease)    Hyperlipidemia    Hypertension    Lung cancer ([place])    Metastatic bone cancer ([place])      Past Surgical History:  Procedure Laterality Date   APPENDECTOMY  1971   CHOLECYSTECTOMY  1971   COLONOSCOPY WITH PROPOFOL N/A 11/15/2016   Procedure: COLONOSCOPY WITH PROPOFOL;  Surgeon: Jonathon Bellows, MD;  Location: [facility] ENDOSCOPY;  Service: Gastroenterology;  Laterality: N/A;   ENDOBRONCHIAL ULTRASOUND Right 07/30/2018   Procedure: ENDOBRONCHIAL ULTRASOUND;  Surgeon: Laverle Hobby, MD;  Location: [facility] ORS;  Service: Pulmonary;  Laterality: Right;   ESOPHAGOGASTRODUODENOSCOPY (EGD) WITH PROPOFOL N/A 01/07/2017   Procedure: ESOPHAGOGASTRODUODENOSCOPY (EGD)  WITH PROPOFOL;  Surgeon: Jonathon Bellows, MD;  Location: [facility] ENDOSCOPY;  Service:  Gastroenterology;  Laterality: N/A;   LAPAROSCOPY N/A 03/01/2017   Procedure: LAPAROSCOPY DIAGNOSTIC;  Surgeon: Robert Bellow, MD;  Location: [facility] ORS;  Service: General;  Laterality: N/A;   PORTA CATH INSERTION N/A 08/14/2018   Procedure: PORTA CATH INSERTION;  Surgeon: Algernon Huxley, MD;  Location: [place] [facility] LAB;  Service: Cardiovascular;  Laterality: N/A;   TONSILECTOMY, ADENOIDECTOMY, BILATERAL MYRINGOTOMY AND TUBES  1955   TONSILLECTOMY     VENTRAL HERNIA REPAIR N/A 03/01/2017   10 x 14 CM Ventralight ST mesh, intraperitoneal location.    VENTRAL HERNIA REPAIR N/A 03/01/2017   Procedure: HERNIA REPAIR VENTRAL ADULT;  Surgeon: Robert Bellow, MD;  Location: [facility] ORS;  Service: General;  Laterality: N/A;    Social History   Socioeconomic History   Marital status: Married    Spouse name: Johnny   Number of children: Not on file   Years of education: Not on file   Highest education level: Not on file  Occupational History   Not on file  Social Needs   Financial resource strain: Not hard at all   Food insecurity    Worry: Never true    Inability: Never true   Transportation needs    Medical: No    Non-medical: No  Tobacco Use   Smoking status: Former Smoker    Packs/day: 2.00    Years: 50.00    Pack years: 100.00    Types: Cigarettes, E-cigarettes    Quit date: 02/07/2012    Years since quitting: 6.7   Smokeless tobacco: Never Used  Substance and Sexual Activity   Alcohol use: No    Alcohol/week: 0.0 standard drinks   Drug use: No   Sexual activity: Yes  Lifestyle   Physical activity    Days per week: Patient refused    Minutes per session: Patient refused   Stress: Not at all  Relationships   Social connections    Talks on phone: Patient refused    Gets together: Patient refused    Attends religious service: Patient refused    Active member of club or organization: Patient refused    Attends meetings of clubs or organizations:  Patient refused    Relationship status: Patient refused   Intimate partner violence    Fear of current or ex partner: No    Emotionally abused: No    Physically abused: No    Forced sexual activity: No  Other Topics Concern   Not on file  Social History Narrative   Married   Retired   Clinical cytogeneticist level of education    No children    1 cup of coffee    Family History  Problem Relation Age of Onset   Hypertension Mother    Ovarian cancer Mother 57   Heart disease Father    Stroke Father    Ovarian cancer Sister        ? dx cancer had hyst.   Breast cancer Neg Hx      Current Outpatient Medications:    buPROPion (WELLBUTRIN XL) 300 MG 24 hr tablet, Take 1 tablet by mouth once daily (Patient taking differently: Take 300 mg by mouth daily. ), Disp: 90 tablet, Rfl: 0   feeding supplement, ENSURE ENLIVE, (ENSURE ENLIVE) LIQD, Take 237 mLs by mouth 2 (two) times daily between meals., Disp: 237 mL, Rfl: 12   folic acid (FOLVITE) 1 MG tablet, Take  1 tablet (1 mg total) by mouth daily., Disp: 30 tablet, Rfl: 3   ibuprofen (ADVIL,MOTRIN) 200 MG tablet, Take 400 mg by mouth every 8 (eight) hours as needed for headache or moderate pain. , Disp: , Rfl:    lidocaine-prilocaine (EMLA) cream, Apply to affected area once, Disp: 30 g, Rfl: 3   loperamide (IMODIUM) 2 MG capsule, Take 1 capsule (2 mg total) by mouth every 2 (two) hours as needed for diarrhea or loose stools. Do not exceed 16mg  daily., Disp: 30 capsule, Rfl: 1   LORazepam (ATIVAN) 0.5 MG tablet, Take 1 tablet (0.5 mg total) by mouth every 6 (six) hours as needed (Nausea or vomiting)., Disp: 30 tablet, Rfl: 0   Multiple Vitamins-Minerals (PRESERVISION AREDS 2) CAPS, Take 1 capsule by mouth 2 (two) times a day. , Disp: , Rfl:    multivitamin-lutein (OCUVITE-LUTEIN) CAPS capsule, Take 1 capsule by mouth daily., Disp: 30 capsule, Rfl: 0   ondansetron (ZOFRAN) 8 MG tablet, Take 1 tablet (8 mg total) by mouth 2  (two) times daily as needed for refractory nausea / vomiting. Start on day 3 after carboplatin chemo., Disp: 30 tablet, Rfl: 1   pantoprazole (PROTONIX) 40 MG tablet, TAKE 1 TABLET BY MOUTH TWICE DAILY BEFORE A MEAL (Patient taking differently: Take 40 mg by mouth 2 (two) times daily before a meal. ), Disp: 180 tablet, Rfl: 0   polyethylene glycol (MIRALAX / GLYCOLAX) 17 g packet, Take 17 g by mouth daily as needed for mild constipation., Disp: 14 each, Rfl: 0   potassium chloride SA (K-DUR) 20 MEQ tablet, Take 1 tablet (20 mEq total) by mouth 2 (two) times daily., Disp: 20 tablet, Rfl: 0   prochlorperazine (COMPAZINE) 10 MG tablet, Take 1 tablet (10 mg total) by mouth every 6 (six) hours as needed (Nausea or vomiting)., Disp: 30 tablet, Rfl: 1   rosuvastatin (CRESTOR) 20 MG tablet, TAKE 1 TABLET BY MOUTH ONCE DAILY (Patient taking differently: Take 20 mg by mouth daily. ), Disp: 90 tablet, Rfl: 3   silver sulfADIAZINE (SILVADENE) 1 % cream, Apply topically 2 (two) times daily., Disp: 50 g, Rfl: 0   escitalopram (LEXAPRO) 10 MG tablet, Take 1 tablet (10 mg total) by mouth daily. (Patient not taking: Reported on 11/14/2018), Disp: 90 tablet, Rfl: 1  Physical exam:  Vitals:   11/17/18 0857  BP: 115/78  Pulse: 89  Temp: 98.7 F (37.1 C)  TempSrc: Tympanic  Weight: 162 lb (73.5 kg)   Physical Exam HENT:     Head: Normocephalic and atraumatic.  Eyes:     Pupils: Pupils are equal, round, and reactive to light.  Neck:     Musculoskeletal: Normal range of motion.  Cardiovascular:     Rate and Rhythm: Normal rate and regular rhythm.     Heart sounds: Normal heart sounds.  Pulmonary:     Effort: Pulmonary effort is normal.     Breath sounds: Normal breath sounds.  Abdominal:     General: Bowel sounds are normal.     Palpations: Abdomen is soft.  Skin:    General: Skin is warm and dry.  Neurological:     Mental Status: She is alert and oriented to person, place, and time.       CMP Latest Ref Rng & Units 11/06/2018  Glucose 70 - 99 mg/dL 97  BUN 8 - 23 mg/dL 6(L)  Creatinine 0.44 - 1.00 mg/dL 0.44  Sodium 135 - 145 mmol/L 142  Potassium 3.5 - 5.1  mmol/L 3.1(L)  Chloride 98 - 111 mmol/L 109  CO2 22 - 32 mmol/L 25  Calcium 8.9 - 10.3 mg/dL 7.2(L)  Total Protein 6.5 - 8.1 g/dL 6.9  Total Bilirubin 0.3 - 1.2 mg/dL 0.6  Alkaline Phos 38 - 126 U/L 126  AST 15 - 41 U/L 19  ALT 0 - 44 U/L 19   CBC Latest Ref Rng & Units 11/06/2018  WBC 4.0 - 10.5 K/uL 6.3  Hemoglobin 12.0 - 15.0 g/dL 9.6(L)  Hematocrit 36.0 - 46.0 % 29.6(L)  Platelets 150 - 400 K/uL 95(L)    No images are attached to the encounter.  Ct Chest W Contrast  Result Date: 11/10/2018 CLINICAL DATA:  Restaging of small-cell lung cancer with brain metastasis. History of cirrhosis. Appendectomy. Known bone metastasis. EXAM: CT CHEST, ABDOMEN, AND PELVIS WITH CONTRAST TECHNIQUE: Multidetector CT imaging of the chest, abdomen and pelvis was performed following the standard protocol during bolus administration of intravenous contrast. CONTRAST:  130mL OMNIPAQUE IOHEXOL 300 MG/ML  SOLN COMPARISON:  09/22/2018 FINDINGS: CT CHEST FINDINGS Cardiovascular: Aortic atherosclerosis. Normal heart size, without pericardial effusion. Lipomatous hypertrophy of the interatrial septum. Multivessel coronary artery atherosclerosis. No central pulmonary embolism, on this non-dedicated study. Mediastinum/Nodes: No supraclavicular adenopathy. Right paratracheal node is decreased at 8 mm on 20/2 versus 10 mm on the prior exam. No hilar adenopathy. Lungs/Pleura: No pleural fluid.  Mild centrilobular emphysema. Posterior right upper lobe pulmonary nodule measures 4 mm on 42/4 and is similar to on the prior exam (when remeasured). Musculoskeletal: Redemonstration of sclerotic and less so lytic osseous metastasis. Healing seventh posterolateral left rib fracture. Compression deformity at T2 is mild-to-moderate and not significantly changed  CT ABDOMEN PELVIS FINDINGS Hepatobiliary: Moderate cirrhosis. No focal liver lesion. Cholecystectomy, without biliary ductal dilatation. Pancreas: Normal, without mass or ductal dilatation. Spleen: Mild splenomegaly, 13.2 cm craniocaudal. Adrenals/Urinary Tract: Mild bilateral adrenal nodularity is unchanged. Interpolar left renal too small to characterize lesion. Normal right kidney, without hydronephrosis. Normal urinary bladder. Stomach/Bowel: Normal stomach, without wall thickening. Normal colon and terminal ileum. Normal small bowel. Vascular/Lymphatic: Nonaneurysmal dilatation of the infrarenal aorta at 2.1 cm. Aortic and branch vessel atherosclerosis. Patent portal and splenic veins. Reproductive: Normal uterus and adnexa. Other: No significant free fluid. Musculoskeletal: Similar widespread sclerotic and less so lytic osseous metastasis. Moderate L2 compression deformity is not significantly changed. A mild superior endplate L1 compression deformity is also similar. Compression fracture at the superior aspect of the sacrum is also unchanged. IMPRESSION: 1. Similar size of a right upper lobe pulmonary nodule. Resolution of previously described borderline mediastinal adenopathy. 2. Widespread osseous metastasis, similar. 3. Cirrhosis and splenomegaly. 4. Aortic atherosclerosis (ICD10-I70.0), coronary artery atherosclerosis and emphysema (ICD10-J43.9). Electronically Signed   By: Abigail Miyamoto M.D.   On: 11/10/2018 13:08   Nm Bone Scan Whole Body  Result Date: 11/10/2018 CLINICAL DATA:  Small cell lung cancer RIGHT upper lobe, osseous metastasis EXAM: NUCLEAR MEDICINE WHOLE BODY BONE SCAN TECHNIQUE: Whole body anterior and posterior images were obtained approximately 3 hours after intravenous injection of radiopharmaceutical. RADIOPHARMACEUTICALS:  23.773 mCi Technetium-74m MDP IV COMPARISON:  09/22/2018 FINDINGS: Numerous sites of abnormal osseous tracer accumulation consistent with widespread osseous  metastatic disease. These include calvaria, ribs, thoracic and lumbar spine, pelvis, femora, humeri. Many of these observed sites demonstrate less uptake of tracer than on the previous exam. Single new focus of tracer accumulation is identified at the mid LEFT femoral diaphysis, subtle. Paucity of soft tissue and urinary tract distribution of tracer indicating  super scan appearance. IMPRESSION: Scattered osseous metastases, with the majority of the lesions demonstrating a lower degree of tracer accumulation on the current exam than was seen previously. Single tiny focus of uptake at the mid LEFT femoral diaphysis likely represents a new metastasis since the prior exam. Electronically Signed   By: Lavonia Dana M.D.   On: 11/10/2018 17:03   Ct Abdomen Pelvis W Contrast  Result Date: 11/10/2018 CLINICAL DATA:  Restaging of small-cell lung cancer with brain metastasis. History of cirrhosis. Appendectomy. Known bone metastasis. EXAM: CT CHEST, ABDOMEN, AND PELVIS WITH CONTRAST TECHNIQUE: Multidetector CT imaging of the chest, abdomen and pelvis was performed following the standard protocol during bolus administration of intravenous contrast. CONTRAST:  189mL OMNIPAQUE IOHEXOL 300 MG/ML  SOLN COMPARISON:  09/22/2018 FINDINGS: CT CHEST FINDINGS Cardiovascular: Aortic atherosclerosis. Normal heart size, without pericardial effusion. Lipomatous hypertrophy of the interatrial septum. Multivessel coronary artery atherosclerosis. No central pulmonary embolism, on this non-dedicated study. Mediastinum/Nodes: No supraclavicular adenopathy. Right paratracheal node is decreased at 8 mm on 20/2 versus 10 mm on the prior exam. No hilar adenopathy. Lungs/Pleura: No pleural fluid.  Mild centrilobular emphysema. Posterior right upper lobe pulmonary nodule measures 4 mm on 42/4 and is similar to on the prior exam (when remeasured). Musculoskeletal: Redemonstration of sclerotic and less so lytic osseous metastasis. Healing seventh  posterolateral left rib fracture. Compression deformity at T2 is mild-to-moderate and not significantly changed CT ABDOMEN PELVIS FINDINGS Hepatobiliary: Moderate cirrhosis. No focal liver lesion. Cholecystectomy, without biliary ductal dilatation. Pancreas: Normal, without mass or ductal dilatation. Spleen: Mild splenomegaly, 13.2 cm craniocaudal. Adrenals/Urinary Tract: Mild bilateral adrenal nodularity is unchanged. Interpolar left renal too small to characterize lesion. Normal right kidney, without hydronephrosis. Normal urinary bladder. Stomach/Bowel: Normal stomach, without wall thickening. Normal colon and terminal ileum. Normal small bowel. Vascular/Lymphatic: Nonaneurysmal dilatation of the infrarenal aorta at 2.1 cm. Aortic and branch vessel atherosclerosis. Patent portal and splenic veins. Reproductive: Normal uterus and adnexa. Other: No significant free fluid. Musculoskeletal: Similar widespread sclerotic and less so lytic osseous metastasis. Moderate L2 compression deformity is not significantly changed. A mild superior endplate L1 compression deformity is also similar. Compression fracture at the superior aspect of the sacrum is also unchanged. IMPRESSION: 1. Similar size of a right upper lobe pulmonary nodule. Resolution of previously described borderline mediastinal adenopathy. 2. Widespread osseous metastasis, similar. 3. Cirrhosis and splenomegaly. 4. Aortic atherosclerosis (ICD10-I70.0), coronary artery atherosclerosis and emphysema (ICD10-J43.9). Electronically Signed   By: Abigail Miyamoto M.D.   On: 11/10/2018 13:08   US Abdomen Limited Ruq  Result Date: 10/30/2018 CLINICAL DATA:  Elevated LFTs EXAM: ULTRASOUND ABDOMEN LIMITED RIGHT UPPER QUADRANT COMPARISON:  CT abdomen and pelvis 09/22/2018 FINDINGS: Gallbladder: Surgically absent Common bile duct: Diameter: 4 mm diameter, normal Liver: Mildly heterogeneous upper normal echogenicity of liver with nodular contours consistent with cirrhosis.  No discrete hepatic mass lesion identified, though assessment of intrahepatic detail is slightly limited due to sound attenuation. Portal vein is patent on color Doppler imaging with normal direction of blood flow towards the liver. Other: No RIGHT upper quadrant free fluid. IMPRESSION: Post cholecystectomy. Cirrhotic appearing liver without definite mass. Electronically Signed   By: Lavonia Dana M.D.   On: 10/30/2018 09:57     Assessment and plan- Patient is a 68 y.o. female with extensive stage lung cancer and bone metastases. He is here for on treatment assessment prior to cycle 1 of maintenance tecentriq  Symptomatically patient feels better today.  She has completed her IV  antibiotics and her counts have improved.  She will proceed with cycle 1 of maintenance Tecentriq today.  Repeat CT chest abdomen pelvis as well as bone scan after 4 cycles of treatment showed improvement in mediastinal adenopathy and overall uptake in the bone areas was also lesser.  She has therefore achieved partial response to chemoimmunotherapy and will remain on maintenance immunotherapy until progression or toxicity.  I will see her back in 3 weeks time with CBC with differential, CMP for cycle 2 of Tecentriq.  I will see her back in 1 week for possible fluids and repeat her blood culture that time as well per ID recommendations.  Bone metastases: Patient was given 1 dose of Xgeva but developed severe hypocalcemia following it.  I will hold off on giving her Xgeva at this time.   Visit Diagnosis 1. Bone metastases ([place])   2. Small cell lung cancer, right upper lobe ([place])   3. Encounter for antineoplastic immunotherapy      Dr. Randa Evens, MD, MPH [facility] at [facility] [phone] 11/17/2018 12:02 PM

## 2018-11-18 ENCOUNTER — Encounter: Payer: PPO | Admitting: Hospice and Palliative Medicine

## 2018-11-19 ENCOUNTER — Encounter: Payer: Self-pay | Admitting: Oncology

## 2018-11-20 NOTE — Telephone Encounter (Signed)
I called patients daughter

## 2018-11-24 ENCOUNTER — Ambulatory Visit: Payer: PPO | Admitting: Radiation Oncology

## 2018-11-24 NOTE — Progress Notes (Signed)
Patient is coming in for follow up, she is doing ok, she fell twice in the last week.

## 2018-11-25 ENCOUNTER — Encounter: Payer: Self-pay | Admitting: Radiation Oncology

## 2018-11-25 ENCOUNTER — Ambulatory Visit
Admission: RE | Admit: 2018-11-25 | Discharge: 2018-11-25 | Disposition: A | Discharge status: Home / Self Care | Payer: PPO | Source: Ambulatory Visit | Attending: Radiation Oncology | Admitting: Radiation Oncology

## 2018-11-25 ENCOUNTER — Inpatient Hospital Stay: Payer: PPO

## 2018-11-25 ENCOUNTER — Other Ambulatory Visit: Payer: Self-pay

## 2018-11-25 ENCOUNTER — Inpatient Hospital Stay (HOSPITAL_BASED_OUTPATIENT_CLINIC_OR_DEPARTMENT_OTHER): Payer: PPO | Admitting: Oncology

## 2018-11-25 VITALS — BP 143/79 | HR 91 | Temp 96.8°F | Resp 16 | Wt 161.6 lb

## 2018-11-25 DIAGNOSIS — Z79899 Other long term (current) drug therapy: Secondary | ICD-10-CM | POA: Insufficient documentation

## 2018-11-25 DIAGNOSIS — Z87891 Personal history of nicotine dependence: Secondary | ICD-10-CM | POA: Insufficient documentation

## 2018-11-25 DIAGNOSIS — C3490 Malignant neoplasm of unspecified part of unspecified bronchus or lung: Secondary | ICD-10-CM | POA: Insufficient documentation

## 2018-11-25 DIAGNOSIS — C7951 Secondary malignant neoplasm of bone: Secondary | ICD-10-CM | POA: Diagnosis not present

## 2018-11-25 DIAGNOSIS — C3411 Malignant neoplasm of upper lobe, right bronchus or lung: Secondary | ICD-10-CM

## 2018-11-25 LAB — CBC WITH DIFFERENTIAL/PLATELET
Abs Immature Granulocytes: 0.02 10*3/uL (ref 0.00–0.07)
Basophils Absolute: 0.1 10*3/uL (ref 0.0–0.1)
Basophils Relative: 2 %
Eosinophils Absolute: 0.1 10*3/uL (ref 0.0–0.5)
Eosinophils Relative: 2 %
HCT: 34 % — ABNORMAL LOW (ref 36.0–46.0)
Hemoglobin: 10.6 g/dL — ABNORMAL LOW (ref 12.0–15.0)
Immature Granulocytes: 1 %
Lymphocytes Relative: 17 %
Lymphs Abs: 0.6 10*3/uL — ABNORMAL LOW (ref 0.7–4.0)
MCH: 30.6 pg (ref 26.0–34.0)
MCHC: 31.2 g/dL (ref 30.0–36.0)
MCV: 98.3 fL (ref 80.0–100.0)
Monocytes Absolute: 0.3 10*3/uL (ref 0.1–1.0)
Monocytes Relative: 8 %
Neutro Abs: 2.3 10*3/uL (ref 1.7–7.7)
Neutrophils Relative %: 70 %
Platelets: 136 10*3/uL — ABNORMAL LOW (ref 150–400)
RBC: 3.46 MIL/uL — ABNORMAL LOW (ref 3.87–5.11)
RDW: 19.7 % — ABNORMAL HIGH (ref 11.5–15.5)
WBC: 3.2 10*3/uL — ABNORMAL LOW (ref 4.0–10.5)
nRBC: 0 % (ref 0.0–0.2)

## 2018-11-25 LAB — COMPREHENSIVE METABOLIC PANEL
ALT: 13 U/L (ref 0–44)
AST: 19 U/L (ref 15–41)
Albumin: 4 g/dL (ref 3.5–5.0)
Alkaline Phosphatase: 148 U/L — ABNORMAL HIGH (ref 38–126)
Anion gap: 7 (ref 5–15)
BUN: 15 mg/dL (ref 8–23)
CO2: 26 mmol/L (ref 22–32)
Calcium: 8.7 mg/dL — ABNORMAL LOW (ref 8.9–10.3)
Chloride: 107 mmol/L (ref 98–111)
Creatinine, Ser: 0.66 mg/dL (ref 0.44–1.00)
GFR calc Af Amer: >60 mL/min (ref >60)
GFR calc non Af Amer: >60 mL/min (ref >60)
Glucose, Bld: 96 mg/dL (ref 70–99)
Potassium: 3.7 mmol/L (ref 3.5–5.1)
Sodium: 140 mmol/L (ref 135–145)
Total Bilirubin: 0.9 mg/dL (ref 0.3–1.2)
Total Protein: 7.2 g/dL (ref 6.5–8.1)

## 2018-11-25 NOTE — Progress Notes (Signed)
Radiation Oncology Follow up Note old patient new area left femoral bone met  Name: Anne [name]   Date:   11/25/2018 MRN:  [id number] DOB: 1950-07-11    This 68 y.o. female presents to the clinic today for reevaluation of bone metastasis and patient with known.  Extensive stage small cell lung cancer  REFERRING PROVIDER: Leone Haven, MD  HPI: Patient is a 68 year old female well-known to our department having previously been treated to her lumbar spine for metastatic involvement of known extensive stage small cell lung cancer.  She has done well with good palliation of pain in that region..  Patient has been treated with palliative carboplatin etoposide and Tecentriq which started in July 2020.  She was recently seen for continuation of maintenance Tecentriq.  Recent repeat CT scans of chest abdomen pelvis showed stable right upper lobe pulmonary nodule with resolution of previous described borderline mediastinal adenopathy.  She does have a new area of uptake on bone scan in the left mid femoral shaft.  She also has a bone scan increased uptake in the left left acetabular region.  I have been asked to evaluate the patient for palliative radiation therapy to that area.  She is fairly asymptomatic specifically denies any problems with ambulation any motor or sensory levels.  COMPLICATIONS OF TREATMENT: none  FOLLOW UP COMPLIANCE: keeps appointments   PHYSICAL EXAM:  BP (!) 143/79 (BP Location: Left Arm, Patient Position: Sitting)   Pulse 91   Temp (!) 96.8 F (36 C) (Tympanic)   Resp 16   Wt 161 lb 9.6 oz (73.3 kg)   BMI 26.89 kg/m  Range of motion of her lower extremities does not elicit pain to palpation of the lumbar spine does not elicit pain.  Motor or sensory and DTR levels are equal and symmetric in the upper lower extremities.  Well-developed well-nourished patient in NAD. HEENT reveals PERLA, EOMI, discs not visualized.  Oral cavity is clear. No oral mucosal lesions are  identified. Neck is clear without evidence of cervical or supraclavicular adenopathy. Lungs are clear to A&P. Cardiac examination is essentially unremarkable with regular rate and rhythm without murmur rub or thrill. Abdomen is benign with no organomegaly or masses noted. Motor sensory and DTR levels are equal and symmetric in the upper and lower extremities. Cranial nerves II through XII are grossly intact. Proprioception is intact. No peripheral adenopathy or edema is identified. No motor or sensory levels are noted. Crude visual fields are within normal range.  RADIOLOGY RESULTS: Bone scan and CT scans of abdomen chest and pelvis all reviewed and compatible with above-stated findings  PLAN: This time like to go ahead with a single fraction of 800 cGy to her left femoral shaft.  I will include also the left acetabular area since this is hot on bone scan and can be easily included in my treatment field.  Risks and benefits of treatment including possible fatigue skin reaction all were discussed in detail with the patient.  I personally set up and ordered CT simulation for later this week.  Patient comprehends my treatment plan well.  I would like to take this opportunity to thank you for allowing me to participate in the care of your patient.Noreene Filbert, MD

## 2018-11-26 ENCOUNTER — Other Ambulatory Visit: Payer: Self-pay

## 2018-11-26 NOTE — Progress Notes (Signed)
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 She has been referred to [place] for findings of bone metastases and her recent MRI. She has a prior history of 3 packs/day day smoking for over 45 years and quit smoking 5 years ago.She had a CT chest abdomen pelvis in 2018 which showed a 5 mm lung nodule in the left lower lobe. Recently over the last 2 months patient has been having worsening back pain and was referred to orthopedics. 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 Pathologic fracture of S1.  PET scan showed 2 RUL lung nodules, hilar and mediastinal adenopathy and widespread bone mets. MRI brain negative.  Patientcompletedpalliative RT to her spine. Bronchoscopy showed small cell lung cancer. Palliative carboplatin, etoposide and Tecentriq started on 08/18/2018   Interval history-patient currently feels well and denies any complaints at this time.  She has completed her IV antibiotics and reports no rectal pain.  Denies any fever.  Her oral intake has been good  ECOG PS- 1 Pain scale- 0   Review of systems- Review  of Systems  Constitutional: Negative for chills, fever, malaise/fatigue and weight loss.  HENT: Negative for congestion, ear discharge and nosebleeds.   Eyes: Negative for blurred vision.  Respiratory: Negative for cough, hemoptysis, sputum production, shortness of breath and wheezing.   Cardiovascular: Negative for chest pain, palpitations, orthopnea and claudication.  Gastrointestinal: Negative for abdominal pain, blood in stool, constipation, diarrhea, heartburn, melena, nausea and vomiting.  Genitourinary: Negative for dysuria, flank pain, frequency, hematuria and urgency.  Musculoskeletal: Negative for back pain, joint pain and myalgias.  Skin: Negative for rash.  Neurological: Negative for dizziness, tingling, focal weakness, seizures, weakness and headaches.  Endo/Heme/Allergies: Does not bruise/bleed easily.  Psychiatric/Behavioral: Negative for depression and suicidal ideas. The patient does not have insomnia.       Allergies  Allergen Reactions   Bee Venom Swelling     Past Medical History:  Diagnosis Date   Arthritis    Cirrhosis ([place])    Depression    GERD (gastroesophageal reflux disease)    Hyperlipidemia    Hypertension    Lung cancer ([place])    Metastatic bone cancer ([place])      Past Surgical History:  Procedure Laterality Date   APPENDECTOMY  1971   CHOLECYSTECTOMY  1971   COLONOSCOPY WITH PROPOFOL N/A 11/15/2016   Procedure: COLONOSCOPY WITH PROPOFOL;  Surgeon: Anne Bellows, MD;  Location: [facility] ENDOSCOPY;  Service: Gastroenterology;  Laterality: N/A;   ENDOBRONCHIAL ULTRASOUND Right 07/30/2018   Procedure: ENDOBRONCHIAL ULTRASOUND;  Surgeon: Anne Hobby, MD;  Location: [facility] ORS;  Service: Pulmonary;  Laterality: Right;   ESOPHAGOGASTRODUODENOSCOPY (EGD) WITH PROPOFOL N/A 01/07/2017   Procedure: ESOPHAGOGASTRODUODENOSCOPY (EGD) WITH PROPOFOL;  Surgeon: Anne Bellows, MD;  Location: [facility] ENDOSCOPY;  Service: Gastroenterology;  Laterality:  N/A;   LAPAROSCOPY N/A 03/01/2017   Procedure: LAPAROSCOPY DIAGNOSTIC;  Surgeon: Anne Bellow, MD;  Location: [facility] ORS;  Service: General;  Laterality: N/A;   PORTA CATH INSERTION N/A 08/14/2018   Procedure: PORTA CATH INSERTION;  Surgeon: Anne Huxley, MD;  Location: [facility];  Service: Cardiovascular;  Laterality: N/A;   TONSILECTOMY, ADENOIDECTOMY, BILATERAL MYRINGOTOMY AND TUBES  1955   TONSILLECTOMY     VENTRAL HERNIA REPAIR N/A 03/01/2017   10 x 14 CM Ventralight ST mesh, intraperitoneal location.    VENTRAL HERNIA REPAIR N/A 03/01/2017   Procedure: HERNIA REPAIR VENTRAL ADULT;  Surgeon: Anne Bellow, MD;  Location: [facility] ORS;  Service: General;  Laterality: N/A;    Social History   Socioeconomic History   Marital status: Married    Spouse name: Johnny   Number of children: Not on file   Years of education: Not on file   Highest education level: Not on file  Occupational History   Not on file  Social Needs   Financial resource strain: Not hard at all   Food insecurity    Worry: Never true    Inability: Never true   Transportation needs    Medical: No    Non-medical: No  Tobacco Use   Smoking status: Former Smoker    Packs/day: 2.00    Years: 50.00    Pack years: 100.00    Types: Cigarettes, E-cigarettes    Quit date: 02/07/2012    Years since quitting: 6.8   Smokeless tobacco: Never Used  Substance and Sexual Activity   Alcohol use: No    Alcohol/week: 0.0 standard drinks   Drug use: No   Sexual activity: Yes  Lifestyle   Physical activity    Days per week: Patient refused    Minutes per session: Patient refused   Stress: Not at all  Relationships   Social connections    Talks on phone: Patient refused    Gets together: Patient refused    Attends religious service: Patient refused    Active member of club or organization: Patient refused    Attends meetings of clubs or organizations: Patient refused    Relationship  status: Patient refused   Intimate partner violence    Fear of current or ex partner: No    Emotionally abused: No    Physically abused: No    Forced sexual activity: No  Other Topics Concern   Not on file  Social History Narrative   Married   Retired   Clinical cytogeneticist level of education    No children    1 cup of coffee    Family History  Problem Relation Age of Onset   Hypertension Mother    Ovarian cancer Mother 2   Heart disease Father    Stroke Father    Ovarian cancer Sister        ? dx cancer had hyst.   Breast cancer Neg Hx      Current Outpatient Medications:    buPROPion (WELLBUTRIN XL) 300 MG 24 hr tablet, Take 1 tablet by mouth once daily (Patient taking differently: Take 300 mg by mouth daily. ), Disp: 90 tablet, Rfl: 0   escitalopram (LEXAPRO) 10 MG tablet, Take 1 tablet (10 mg total) by mouth daily., Disp: 90 tablet, Rfl: 1   feeding supplement, ENSURE ENLIVE, (ENSURE ENLIVE) LIQD, Take 237 mLs by mouth 2 (two)  times daily between meals., Disp: 237 mL, Rfl: 12   folic acid (FOLVITE) 1 MG tablet, Take 1 tablet (1 mg total) by mouth daily., Disp: 30 tablet, Rfl: 3   ibuprofen (ADVIL,MOTRIN) 200 MG tablet, Take 400 mg by mouth every 8 (eight) hours as needed for headache or moderate pain. , Disp: , Rfl:    lidocaine-prilocaine (EMLA) cream, Apply to affected area once, Disp: 30 g, Rfl: 3   loperamide (IMODIUM) 2 MG capsule, Take 1 capsule (2 mg total) by mouth every 2 (two) hours as needed for diarrhea or loose stools. Do not exceed 16mg  daily., Disp: 30 capsule, Rfl: 1   LORazepam (ATIVAN) 0.5 MG tablet, Take 1 tablet (0.5 mg total) by mouth every 6 (six) hours as needed (Nausea or vomiting)., Disp: 30 tablet, Rfl: 0   Multiple Vitamins-Minerals (PRESERVISION AREDS 2) CAPS, Take 1 capsule by mouth 2 (two) times a day. , Disp: , Rfl:    multivitamin-lutein (OCUVITE-LUTEIN) CAPS capsule, Take 1 capsule by mouth daily., Disp: 30 capsule, Rfl:  0   ondansetron (ZOFRAN) 8 MG tablet, Take 1 tablet (8 mg total) by mouth 2 (two) times daily as needed for refractory nausea / vomiting. Start on day 3 after carboplatin chemo., Disp: 30 tablet, Rfl: 1   pantoprazole (PROTONIX) 40 MG tablet, TAKE 1 TABLET BY MOUTH TWICE DAILY BEFORE A MEAL (Patient taking differently: Take 40 mg by mouth 2 (two) times daily before a meal. ), Disp: 180 tablet, Rfl: 0   polyethylene glycol (MIRALAX / GLYCOLAX) 17 g packet, Take 17 g by mouth daily as needed for mild constipation., Disp: 14 each, Rfl: 0   potassium chloride SA (K-DUR) 20 MEQ tablet, Take 1 tablet (20 mEq total) by mouth 2 (two) times daily., Disp: 20 tablet, Rfl: 0   prochlorperazine (COMPAZINE) 10 MG tablet, Take 1 tablet (10 mg total) by mouth every 6 (six) hours as needed (Nausea or vomiting)., Disp: 30 tablet, Rfl: 1   rosuvastatin (CRESTOR) 20 MG tablet, TAKE 1 TABLET BY MOUTH ONCE DAILY (Patient taking differently: Take 20 mg by mouth daily. ), Disp: 90 tablet, Rfl: 3   silver sulfADIAZINE (SILVADENE) 1 % cream, Apply topically 2 (two) times daily., Disp: 50 g, Rfl: 0  Physical exam: There were no vitals filed for this visit. Physical Exam Constitutional:      General: She is not in acute distress. HENT:     Head: Normocephalic and atraumatic.  Eyes:     Pupils: Pupils are equal, round, and reactive to light.  Neck:     Musculoskeletal: Normal range of motion.  Cardiovascular:     Rate and Rhythm: Normal rate and regular rhythm.     Heart sounds: Normal heart sounds.  Pulmonary:     Effort: Pulmonary effort is normal.     Breath sounds: Normal breath sounds.  Abdominal:     General: Bowel sounds are normal.     Palpations: Abdomen is soft.     Comments: External sore which was seen in the perianal region previously has healed well.  No evidence of active infection  Skin:    General: Skin is warm and dry.  Neurological:     Mental Status: She is alert and oriented to person,  place, and time.      CMP Latest Ref Rng & Units 11/25/2018  Glucose 70 - 99 mg/dL 96  BUN 8 - 23 mg/dL 15  Creatinine 0.44 - 1.00 mg/dL 0.66  Sodium 135 - 145  mmol/L 140  Potassium 3.5 - 5.1 mmol/L 3.7  Chloride 98 - 111 mmol/L 107  CO2 22 - 32 mmol/L 26  Calcium 8.9 - 10.3 mg/dL 8.7(L)  Total Protein 6.5 - 8.1 g/dL 7.2  Total Bilirubin 0.3 - 1.2 mg/dL 0.9  Alkaline Phos 38 - 126 U/L 148(H)  AST 15 - 41 U/L 19  ALT 0 - 44 U/L 13   CBC Latest Ref Rng & Units 11/25/2018  WBC 4.0 - 10.5 K/uL 3.2(L)  Hemoglobin 12.0 - 15.0 g/dL 10.6(L)  Hematocrit 36.0 - 46.0 % 34.0(L)  Platelets 150 - 400 K/uL 136(L)    No images are attached to the encounter.  Ct Chest W Contrast  Result Date: 11/10/2018 CLINICAL DATA:  Restaging of small-cell lung cancer with brain metastasis. History of cirrhosis. Appendectomy. Known bone metastasis. EXAM: CT CHEST, ABDOMEN, AND PELVIS WITH CONTRAST TECHNIQUE: Multidetector CT imaging of the chest, abdomen and pelvis was performed following the standard protocol during bolus administration of intravenous contrast. CONTRAST:  158mL OMNIPAQUE IOHEXOL 300 MG/ML  SOLN COMPARISON:  09/22/2018 FINDINGS: CT CHEST FINDINGS Cardiovascular: Aortic atherosclerosis. Normal heart size, without pericardial effusion. Lipomatous hypertrophy of the interatrial septum. Multivessel coronary artery atherosclerosis. No central pulmonary embolism, on this non-dedicated study. Mediastinum/Nodes: No supraclavicular adenopathy. Right paratracheal node is decreased at 8 mm on 20/2 versus 10 mm on the prior exam. No hilar adenopathy. Lungs/Pleura: No pleural fluid.  Mild centrilobular emphysema. Posterior right upper lobe pulmonary nodule measures 4 mm on 42/4 and is similar to on the prior exam (when remeasured). Musculoskeletal: Redemonstration of sclerotic and less so lytic osseous metastasis. Healing seventh posterolateral left rib fracture. Compression deformity at T2 is mild-to-moderate  and not significantly changed CT ABDOMEN PELVIS FINDINGS Hepatobiliary: Moderate cirrhosis. No focal liver lesion. Cholecystectomy, without biliary ductal dilatation. Pancreas: Normal, without mass or ductal dilatation. Spleen: Mild splenomegaly, 13.2 cm craniocaudal. Adrenals/Urinary Tract: Mild bilateral adrenal nodularity is unchanged. Interpolar left renal too small to characterize lesion. Normal right kidney, without hydronephrosis. Normal urinary bladder. Stomach/Bowel: Normal stomach, without wall thickening. Normal colon and terminal ileum. Normal small bowel. Vascular/Lymphatic: Nonaneurysmal dilatation of the infrarenal aorta at 2.1 cm. Aortic and branch vessel atherosclerosis. Patent portal and splenic veins. Reproductive: Normal uterus and adnexa. Other: No significant free fluid. Musculoskeletal: Similar widespread sclerotic and less so lytic osseous metastasis. Moderate L2 compression deformity is not significantly changed. A mild superior endplate L1 compression deformity is also similar. Compression fracture at the superior aspect of the sacrum is also unchanged. IMPRESSION: 1. Similar size of a right upper lobe pulmonary nodule. Resolution of previously described borderline mediastinal adenopathy. 2. Widespread osseous metastasis, similar. 3. Cirrhosis and splenomegaly. 4. Aortic atherosclerosis (ICD10-I70.0), coronary artery atherosclerosis and emphysema (ICD10-J43.9). Electronically Signed   By: Abigail Miyamoto M.D.   On: 11/10/2018 13:08   Nm Bone Scan Whole Body  Result Date: 11/10/2018 CLINICAL DATA:  Small cell lung cancer RIGHT upper lobe, osseous metastasis EXAM: NUCLEAR MEDICINE WHOLE BODY BONE SCAN TECHNIQUE: Whole body anterior and posterior images were obtained approximately 3 hours after intravenous injection of radiopharmaceutical. RADIOPHARMACEUTICALS:  23.773 mCi Technetium-16m MDP IV COMPARISON:  09/22/2018 FINDINGS: Numerous sites of abnormal osseous tracer accumulation consistent  with widespread osseous metastatic disease. These include calvaria, ribs, thoracic and lumbar spine, pelvis, femora, humeri. Many of these observed sites demonstrate less uptake of tracer than on the previous exam. Single new focus of tracer accumulation is identified at the mid LEFT femoral diaphysis, subtle. Paucity of soft tissue  and urinary tract distribution of tracer indicating super scan appearance. IMPRESSION: Scattered osseous metastases, with the majority of the lesions demonstrating a lower degree of tracer accumulation on the current exam than was seen previously. Single tiny focus of uptake at the mid LEFT femoral diaphysis likely represents a new metastasis since the prior exam. Electronically Signed   By: Lavonia Dana M.D.   On: 11/10/2018 17:03   Ct Abdomen Pelvis W Contrast  Result Date: 11/10/2018 CLINICAL DATA:  Restaging of small-cell lung cancer with brain metastasis. History of cirrhosis. Appendectomy. Known bone metastasis. EXAM: CT CHEST, ABDOMEN, AND PELVIS WITH CONTRAST TECHNIQUE: Multidetector CT imaging of the chest, abdomen and pelvis was performed following the standard protocol during bolus administration of intravenous contrast. CONTRAST:  138mL OMNIPAQUE IOHEXOL 300 MG/ML  SOLN COMPARISON:  09/22/2018 FINDINGS: CT CHEST FINDINGS Cardiovascular: Aortic atherosclerosis. Normal heart size, without pericardial effusion. Lipomatous hypertrophy of the interatrial septum. Multivessel coronary artery atherosclerosis. No central pulmonary embolism, on this non-dedicated study. Mediastinum/Nodes: No supraclavicular adenopathy. Right paratracheal node is decreased at 8 mm on 20/2 versus 10 mm on the prior exam. No hilar adenopathy. Lungs/Pleura: No pleural fluid.  Mild centrilobular emphysema. Posterior right upper lobe pulmonary nodule measures 4 mm on 42/4 and is similar to on the prior exam (when remeasured). Musculoskeletal: Redemonstration of sclerotic and less so lytic osseous  metastasis. Healing seventh posterolateral left rib fracture. Compression deformity at T2 is mild-to-moderate and not significantly changed CT ABDOMEN PELVIS FINDINGS Hepatobiliary: Moderate cirrhosis. No focal liver lesion. Cholecystectomy, without biliary ductal dilatation. Pancreas: Normal, without mass or ductal dilatation. Spleen: Mild splenomegaly, 13.2 cm craniocaudal. Adrenals/Urinary Tract: Mild bilateral adrenal nodularity is unchanged. Interpolar left renal too small to characterize lesion. Normal right kidney, without hydronephrosis. Normal urinary bladder. Stomach/Bowel: Normal stomach, without wall thickening. Normal colon and terminal ileum. Normal small bowel. Vascular/Lymphatic: Nonaneurysmal dilatation of the infrarenal aorta at 2.1 cm. Aortic and branch vessel atherosclerosis. Patent portal and splenic veins. Reproductive: Normal uterus and adnexa. Other: No significant free fluid. Musculoskeletal: Similar widespread sclerotic and less so lytic osseous metastasis. Moderate L2 compression deformity is not significantly changed. A mild superior endplate L1 compression deformity is also similar. Compression fracture at the superior aspect of the sacrum is also unchanged. IMPRESSION: 1. Similar size of a right upper lobe pulmonary nodule. Resolution of previously described borderline mediastinal adenopathy. 2. Widespread osseous metastasis, similar. 3. Cirrhosis and splenomegaly. 4. Aortic atherosclerosis (ICD10-I70.0), coronary artery atherosclerosis and emphysema (ICD10-J43.9). Electronically Signed   By: Abigail Miyamoto M.D.   On: 11/10/2018 13:08   US Abdomen Limited Ruq  Result Date: 10/30/2018 CLINICAL DATA:  Elevated LFTs EXAM: ULTRASOUND ABDOMEN LIMITED RIGHT UPPER QUADRANT COMPARISON:  CT abdomen and pelvis 09/22/2018 FINDINGS: Gallbladder: Surgically absent Common bile duct: Diameter: 4 mm diameter, normal Liver: Mildly heterogeneous upper normal echogenicity of liver with nodular contours  consistent with cirrhosis. No discrete hepatic mass lesion identified, though assessment of intrahepatic detail is slightly limited due to sound attenuation. Portal vein is patent on color Doppler imaging with normal direction of blood flow towards the liver. Other: No RIGHT upper quadrant free fluid. IMPRESSION: Post cholecystectomy. Cirrhotic appearing liver without definite mass. Electronically Signed   By: Lavonia Dana M.D.   On: 10/30/2018 09:57     Assessment and plan- Patient is a 68 y.o. female with extensive stage small cell lung cancer and bone metastases.  This is a follow-up visit after first cycle of maintenance Tecentriq  Patient has tolerated maintenance  Tecentriq 1 well.  She was recently hospitalized for streptococcal bacteremia after 4 cycles of chemotherapy and is doing well since her hospitalization.  Her oral intake is good and she does not require any IV fluids at this time.  She has also completed her IV antibiotics and her repeat blood cultures which were drawn today have not shown any growth so far.  Perianal solitary ulcer has healed well.  Patient will also be seen by Dr. Donella Stade for possible uptake in the left humeral diaphysis for palliative radiation.  I will see her back in 2 weeks time with CBC with differential, CMP for next cycle of maintenance Tecentriq   Visit Diagnosis 1. Small cell lung cancer, right upper lobe ([place])      Dr. Randa Evens, MD, MPH [facility] [phone] 11/26/2018 11:42 AM

## 2018-11-27 ENCOUNTER — Other Ambulatory Visit: Payer: Self-pay

## 2018-11-27 ENCOUNTER — Ambulatory Visit
Admission: RE | Admit: 2018-11-27 | Discharge: 2018-11-27 | Disposition: A | Discharge status: Home / Self Care | Payer: PPO | Source: Ambulatory Visit | Attending: Radiation Oncology | Admitting: Radiation Oncology

## 2018-11-27 DIAGNOSIS — Z87891 Personal history of nicotine dependence: Secondary | ICD-10-CM | POA: Diagnosis not present

## 2018-11-27 DIAGNOSIS — C3490 Malignant neoplasm of unspecified part of unspecified bronchus or lung: Secondary | ICD-10-CM | POA: Diagnosis not present

## 2018-11-27 DIAGNOSIS — C7951 Secondary malignant neoplasm of bone: Secondary | ICD-10-CM | POA: Insufficient documentation

## 2018-11-27 DIAGNOSIS — Z510 Encounter for antineoplastic radiation therapy: Secondary | ICD-10-CM | POA: Insufficient documentation

## 2018-11-30 LAB — CULTURE, BLOOD (ROUTINE X 2)
Culture: NO GROWTH
Culture: NO GROWTH

## 2018-12-01 DIAGNOSIS — Z510 Encounter for antineoplastic radiation therapy: Secondary | ICD-10-CM | POA: Diagnosis not present

## 2018-12-01 DIAGNOSIS — C7951 Secondary malignant neoplasm of bone: Secondary | ICD-10-CM | POA: Diagnosis not present

## 2018-12-01 DIAGNOSIS — C3490 Malignant neoplasm of unspecified part of unspecified bronchus or lung: Secondary | ICD-10-CM | POA: Diagnosis not present

## 2018-12-01 DIAGNOSIS — Z87891 Personal history of nicotine dependence: Secondary | ICD-10-CM | POA: Diagnosis not present

## 2018-12-02 ENCOUNTER — Other Ambulatory Visit: Payer: Self-pay

## 2018-12-02 ENCOUNTER — Ambulatory Visit
Admission: RE | Admit: 2018-12-02 | Discharge: 2018-12-02 | Disposition: A | Discharge status: Home / Self Care | Payer: PPO | Source: Ambulatory Visit | Attending: Radiation Oncology | Admitting: Radiation Oncology

## 2018-12-02 DIAGNOSIS — Z510 Encounter for antineoplastic radiation therapy: Secondary | ICD-10-CM | POA: Diagnosis not present

## 2018-12-02 DIAGNOSIS — C7951 Secondary malignant neoplasm of bone: Secondary | ICD-10-CM | POA: Diagnosis not present

## 2018-12-02 DIAGNOSIS — C3490 Malignant neoplasm of unspecified part of unspecified bronchus or lung: Secondary | ICD-10-CM | POA: Diagnosis not present

## 2018-12-02 DIAGNOSIS — Z87891 Personal history of nicotine dependence: Secondary | ICD-10-CM | POA: Diagnosis not present

## 2018-12-05 ENCOUNTER — Other Ambulatory Visit: Payer: Self-pay

## 2018-12-05 ENCOUNTER — Encounter: Payer: Self-pay | Admitting: Oncology

## 2018-12-05 NOTE — Progress Notes (Signed)
Patient stated that she had been constipated but took Miralax and helped her out. Patient stated that she had been doing well after that episode.

## 2018-12-08 ENCOUNTER — Other Ambulatory Visit: Payer: Self-pay

## 2018-12-08 ENCOUNTER — Ambulatory Visit: Payer: PPO | Admitting: Oncology

## 2018-12-08 ENCOUNTER — Inpatient Hospital Stay: Payer: PPO | Attending: Oncology

## 2018-12-08 ENCOUNTER — Ambulatory Visit: Payer: PPO

## 2018-12-08 ENCOUNTER — Inpatient Hospital Stay: Payer: PPO

## 2018-12-08 ENCOUNTER — Other Ambulatory Visit: Payer: PPO

## 2018-12-08 ENCOUNTER — Inpatient Hospital Stay (HOSPITAL_BASED_OUTPATIENT_CLINIC_OR_DEPARTMENT_OTHER): Payer: PPO | Admitting: Oncology

## 2018-12-08 VITALS — BP 127/77 | HR 97 | Temp 96.5°F | Resp 16 | Wt 162.9 lb

## 2018-12-08 DIAGNOSIS — C3411 Malignant neoplasm of upper lobe, right bronchus or lung: Secondary | ICD-10-CM

## 2018-12-08 DIAGNOSIS — D649 Anemia, unspecified: Secondary | ICD-10-CM | POA: Diagnosis not present

## 2018-12-08 DIAGNOSIS — M545 Low back pain: Secondary | ICD-10-CM | POA: Diagnosis not present

## 2018-12-08 DIAGNOSIS — Z5112 Encounter for antineoplastic immunotherapy: Secondary | ICD-10-CM | POA: Diagnosis not present

## 2018-12-08 DIAGNOSIS — K7460 Unspecified cirrhosis of liver: Secondary | ICD-10-CM | POA: Insufficient documentation

## 2018-12-08 DIAGNOSIS — C7931 Secondary malignant neoplasm of brain: Secondary | ICD-10-CM | POA: Insufficient documentation

## 2018-12-08 DIAGNOSIS — Z791 Long term (current) use of non-steroidal anti-inflammatories (NSAID): Secondary | ICD-10-CM | POA: Insufficient documentation

## 2018-12-08 DIAGNOSIS — E785 Hyperlipidemia, unspecified: Secondary | ICD-10-CM | POA: Diagnosis not present

## 2018-12-08 DIAGNOSIS — C7951 Secondary malignant neoplasm of bone: Secondary | ICD-10-CM | POA: Diagnosis not present

## 2018-12-08 DIAGNOSIS — D696 Thrombocytopenia, unspecified: Secondary | ICD-10-CM | POA: Diagnosis not present

## 2018-12-08 DIAGNOSIS — Z8249 Family history of ischemic heart disease and other diseases of the circulatory system: Secondary | ICD-10-CM | POA: Diagnosis not present

## 2018-12-08 DIAGNOSIS — K219 Gastro-esophageal reflux disease without esophagitis: Secondary | ICD-10-CM | POA: Diagnosis not present

## 2018-12-08 DIAGNOSIS — Z87891 Personal history of nicotine dependence: Secondary | ICD-10-CM | POA: Insufficient documentation

## 2018-12-08 DIAGNOSIS — I10 Essential (primary) hypertension: Secondary | ICD-10-CM | POA: Diagnosis not present

## 2018-12-08 DIAGNOSIS — F329 Major depressive disorder, single episode, unspecified: Secondary | ICD-10-CM | POA: Diagnosis not present

## 2018-12-08 DIAGNOSIS — D72819 Decreased white blood cell count, unspecified: Secondary | ICD-10-CM | POA: Diagnosis not present

## 2018-12-08 DIAGNOSIS — Z79899 Other long term (current) drug therapy: Secondary | ICD-10-CM | POA: Insufficient documentation

## 2018-12-08 DIAGNOSIS — Z95828 Presence of other vascular implants and grafts: Secondary | ICD-10-CM

## 2018-12-08 LAB — COMPREHENSIVE METABOLIC PANEL
ALT: 16 U/L (ref 0–44)
AST: 19 U/L (ref 15–41)
Albumin: 3.8 g/dL (ref 3.5–5.0)
Alkaline Phosphatase: 142 U/L — ABNORMAL HIGH (ref 38–126)
Anion gap: 10 (ref 5–15)
BUN: 14 mg/dL (ref 8–23)
CO2: 24 mmol/L (ref 22–32)
Calcium: 8.4 mg/dL — ABNORMAL LOW (ref 8.9–10.3)
Chloride: 107 mmol/L (ref 98–111)
Creatinine, Ser: 0.6 mg/dL (ref 0.44–1.00)
GFR calc Af Amer: >60 mL/min (ref >60)
GFR calc non Af Amer: >60 mL/min (ref >60)
Glucose, Bld: 94 mg/dL (ref 70–99)
Potassium: 3.6 mmol/L (ref 3.5–5.1)
Sodium: 141 mmol/L (ref 135–145)
Total Bilirubin: 0.4 mg/dL (ref 0.3–1.2)
Total Protein: 6.7 g/dL (ref 6.5–8.1)

## 2018-12-08 LAB — CBC WITH DIFFERENTIAL/PLATELET
Abs Immature Granulocytes: 0.01 10*3/uL (ref 0.00–0.07)
Basophils Absolute: 0 10*3/uL (ref 0.0–0.1)
Basophils Relative: 1 %
Eosinophils Absolute: 0.1 10*3/uL (ref 0.0–0.5)
Eosinophils Relative: 3 %
HCT: 33.8 % — ABNORMAL LOW (ref 36.0–46.0)
Hemoglobin: 10.5 g/dL — ABNORMAL LOW (ref 12.0–15.0)
Immature Granulocytes: 0 %
Lymphocytes Relative: 19 %
Lymphs Abs: 0.6 10*3/uL — ABNORMAL LOW (ref 0.7–4.0)
MCH: 30.6 pg (ref 26.0–34.0)
MCHC: 31.1 g/dL (ref 30.0–36.0)
MCV: 98.5 fL (ref 80.0–100.0)
Monocytes Absolute: 0.2 10*3/uL (ref 0.1–1.0)
Monocytes Relative: 8 %
Neutro Abs: 2.3 10*3/uL (ref 1.7–7.7)
Neutrophils Relative %: 69 %
Platelets: 109 10*3/uL — ABNORMAL LOW (ref 150–400)
RBC: 3.43 MIL/uL — ABNORMAL LOW (ref 3.87–5.11)
RDW: 15.9 % — ABNORMAL HIGH (ref 11.5–15.5)
WBC: 3.2 10*3/uL — ABNORMAL LOW (ref 4.0–10.5)
nRBC: 0 % (ref 0.0–0.2)

## 2018-12-08 MED ORDER — SODIUM CHLORIDE 0.9 % IV SOLN
1200.0000 mg | Freq: Once | INTRAVENOUS | Status: AC
  Administered 2018-12-08: 1200 mg via INTRAVENOUS
  Filled 2018-12-08: qty 20

## 2018-12-08 MED ORDER — HEPARIN SOD (PORK) LOCK FLUSH 100 UNIT/ML IV SOLN
500.0000 [IU] | Freq: Once | INTRAVENOUS | Status: AC | PRN
  Administered 2018-12-08: 500 [IU]
  Filled 2018-12-08: qty 5

## 2018-12-08 MED ORDER — SODIUM CHLORIDE 0.9 % IV SOLN
Freq: Once | INTRAVENOUS | Status: AC
  Administered 2018-12-08: 11:00:00 via INTRAVENOUS
  Filled 2018-12-08: qty 250

## 2018-12-08 MED ORDER — SODIUM CHLORIDE 0.9% FLUSH
10.0000 mL | Freq: Once | INTRAVENOUS | Status: AC
  Administered 2018-12-08: 10 mL via INTRAVENOUS
  Filled 2018-12-08: qty 10

## 2018-12-09 NOTE — Progress Notes (Signed)
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 She has been referred to [place] for findings of bone metastases and her recent MRI. She has a prior history of 3 packs/day day smoking for over 45 years and quit smoking 5 years ago.She had a CT chest abdomen pelvis in 2018 which showed a 5 mm lung nodule in the left lower lobe. Recently over the last 2 months patient has been having worsening back pain and was referred to orthopedics. 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 Pathologic fracture of S1.  PET scan showed 2 RUL lung nodules, hilar and mediastinal adenopathy and widespread bone mets. MRI brain negative.  Patientcompletedpalliative RT to her spine. Bronchoscopy showed small cell lung cancer. Palliative carboplatin, etoposide and Tecentriq started on 08/18/2018   Interval history-she feels well and denies any complaints at this time.  Back pain is well controlled.  ECOG PS- 1 Pain scale- 0 Opioid associated constipation- no  Review of systems- Review of Systems  Constitutional: Negative for chills, fever,  malaise/fatigue and weight loss.  HENT: Negative for congestion, ear discharge and nosebleeds.   Eyes: Negative for blurred vision.  Respiratory: Negative for cough, hemoptysis, sputum production, shortness of breath and wheezing.   Cardiovascular: Negative for chest pain, palpitations, orthopnea and claudication.  Gastrointestinal: Negative for abdominal pain, blood in stool, constipation, diarrhea, heartburn, melena, nausea and vomiting.  Genitourinary: Negative for dysuria, flank pain, frequency, hematuria and urgency.  Musculoskeletal: Negative for back pain, joint pain and myalgias.  Skin: Negative for rash.  Neurological: Negative for dizziness, tingling, focal weakness, seizures, weakness and headaches.  Endo/Heme/Allergies: Does not bruise/bleed easily.  Psychiatric/Behavioral: Negative for depression and suicidal ideas. The patient does not have insomnia.        Allergies  Allergen Reactions   Bee Venom Swelling     Past Medical History:  Diagnosis Date   Arthritis    Cirrhosis ([place])    Depression    GERD (gastroesophageal reflux disease)    Hyperlipidemia    Hypertension    Lung cancer ([place])    Metastatic bone cancer ([place])      Past Surgical History:  Procedure Laterality Date   APPENDECTOMY  1971   CHOLECYSTECTOMY  1971   COLONOSCOPY WITH PROPOFOL N/A 11/15/2016   Procedure: COLONOSCOPY WITH PROPOFOL;  Surgeon: Jonathon Bellows, MD;  Location: [facility] ENDOSCOPY;  Service: Gastroenterology;  Laterality: N/A;   ENDOBRONCHIAL ULTRASOUND Right 07/30/2018   Procedure: ENDOBRONCHIAL ULTRASOUND;  Surgeon: Laverle Hobby, MD;  Location: [facility] ORS;  Service: Pulmonary;  Laterality: Right;   ESOPHAGOGASTRODUODENOSCOPY (EGD) WITH PROPOFOL N/A 01/07/2017   Procedure: ESOPHAGOGASTRODUODENOSCOPY (EGD) WITH PROPOFOL;  Surgeon: Jonathon Bellows, MD;  Location: [facility]COPY;  Service: Gastroenterology;  Laterality:  N/A;   LAPAROSCOPY N/A 03/01/2017   Procedure:  LAPAROSCOPY DIAGNOSTIC;  Surgeon: Robert Bellow, MD;  Location: [facility] ORS;  Service: General;  Laterality: N/A;   PORTA CATH INSERTION N/A 08/14/2018   Procedure: PORTA CATH INSERTION;  Surgeon: Algernon Huxley, MD;  Location: [place] CV LAB;  Service: Cardiovascular;  Laterality: N/A;   TONSILECTOMY, ADENOIDECTOMY, BILATERAL MYRINGOTOMY AND TUBES  1955   TONSILLECTOMY     VENTRAL HERNIA REPAIR N/A 03/01/2017   10 x 14 CM Ventralight ST mesh, intraperitoneal location.    VENTRAL HERNIA REPAIR N/A 03/01/2017   Procedure: HERNIA REPAIR VENTRAL ADULT;  Surgeon: Robert Bellow, MD;  Location: [facility] ORS;  Service: General;  Laterality: N/A;    Social History   Socioeconomic History   Marital status: Married    Spouse name: Johnny   Number of children: Not on file   Years of education: Not on file   Highest education level: Not on file  Occupational History   Not on file  Social Needs   Financial resource strain: Not hard at all   Food insecurity    Worry: Never true    Inability: Never true   Transportation needs    Medical: No    Non-medical: No  Tobacco Use   Smoking status: Former Smoker    Packs/day: 2.00    Years: 50.00    Pack years: 100.00    Types: Cigarettes, E-cigarettes    Quit date: 02/07/2012    Years since quitting: 6.8   Smokeless tobacco: Never Used  Substance and Sexual Activity   Alcohol use: No    Alcohol/week: 0.0 standard drinks   Drug use: No   Sexual activity: Yes  Lifestyle   Physical activity    Days per week: Patient refused    Minutes per session: Patient refused   Stress: Not at all  Relationships   Social connections    Talks on phone: Patient refused    Gets together: Patient refused    Attends religious service: Patient refused    Active member of club or organization: Patient refused    Attends meetings of clubs or organizations: Patient refused    Relationship status: Patient refused   Intimate partner  violence    Fear of current or ex partner: No    Emotionally abused: No    Physically abused: No    Forced sexual activity: No  Other Topics Concern   Not on file  Social History Narrative   Married   Retired   Clinical cytogeneticist level of education    No children    1 cup of coffee    Family History  Problem Relation Age of Onset   Hypertension Mother    Ovarian cancer Mother 72   Heart disease Father    Stroke Father    Ovarian cancer Sister        ? dx cancer had hyst.   Breast cancer Neg Hx      Current Outpatient Medications:    escitalopram (LEXAPRO) 10 MG tablet, Take 1 tablet (10 mg total) by mouth daily., Disp: 90 tablet, Rfl: 1   feeding supplement, ENSURE ENLIVE, (ENSURE ENLIVE) LIQD, Take 237 mLs by mouth 2 (two) times daily between meals., Disp: 237 mL, Rfl: 12   folic acid (FOLVITE) 1 MG tablet, Take 1 tablet (1 mg total) by mouth daily., Disp: 30 tablet, Rfl: 3   ibuprofen (ADVIL,MOTRIN) 200 MG tablet, Take 400 mg by mouth every 8 (  eight) hours as needed for headache or moderate pain. , Disp: , Rfl:    lidocaine-prilocaine (EMLA) cream, Apply to affected area once, Disp: 30 g, Rfl: 3   loperamide (IMODIUM) 2 MG capsule, Take 1 capsule (2 mg total) by mouth every 2 (two) hours as needed for diarrhea or loose stools. Do not exceed 16mg  daily., Disp: 30 capsule, Rfl: 1   Multiple Vitamins-Minerals (PRESERVISION AREDS 2) CAPS, Take 1 capsule by mouth 2 (two) times a day. , Disp: , Rfl:    ondansetron (ZOFRAN) 8 MG tablet, Take 1 tablet (8 mg total) by mouth 2 (two) times daily as needed for refractory nausea / vomiting. Start on day 3 after carboplatin chemo., Disp: 30 tablet, Rfl: 1   polyethylene glycol (MIRALAX / GLYCOLAX) 17 g packet, Take 17 g by mouth daily as needed for mild constipation., Disp: 14 each, Rfl: 0   rosuvastatin (CRESTOR) 20 MG tablet, TAKE 1 TABLET BY MOUTH ONCE DAILY (Patient taking differently: Take 20 mg by mouth daily. ),  Disp: 90 tablet, Rfl: 3   silver sulfADIAZINE (SILVADENE) 1 % cream, Apply topically 2 (two) times daily., Disp: 50 g, Rfl: 0   prochlorperazine (COMPAZINE) 10 MG tablet, Take 1 tablet (10 mg total) by mouth every 6 (six) hours as needed (Nausea or vomiting). (Patient not taking: Reported on 12/05/2018), Disp: 30 tablet, Rfl: 1  Physical exam:  Vitals:   12/08/18 1006 12/08/18 1033  BP: 127/77   Pulse: 82 97  Resp: 16   Temp:  (!) 96.5 F (35.8 C)  TempSrc:  Tympanic  Weight: 162 lb 14.4 oz (73.9 kg)    Physical Exam HENT:     Head: Normocephalic and atraumatic.  Eyes:     Pupils: Pupils are equal, round, and reactive to light.  Neck:     Musculoskeletal: Normal range of motion.  Cardiovascular:     Rate and Rhythm: Normal rate and regular rhythm.     Heart sounds: Normal heart sounds.  Pulmonary:     Effort: Pulmonary effort is normal.     Breath sounds: Normal breath sounds.  Abdominal:     General: Bowel sounds are normal.     Palpations: Abdomen is soft.  Skin:    General: Skin is warm and dry.  Neurological:     Mental Status: She is alert and oriented to person, place, and time.      CMP Latest Ref Rng & Units 12/08/2018  Glucose 70 - 99 mg/dL 94  BUN 8 - 23 mg/dL 14  Creatinine 0.44 - 1.00 mg/dL 0.60  Sodium 135 - 145 mmol/L 141  Potassium 3.5 - 5.1 mmol/L 3.6  Chloride 98 - 111 mmol/L 107  CO2 22 - 32 mmol/L 24  Calcium 8.9 - 10.3 mg/dL 8.4(L)  Total Protein 6.5 - 8.1 g/dL 6.7  Total Bilirubin 0.3 - 1.2 mg/dL 0.4  Alkaline Phos 38 - 126 U/L 142(H)  AST 15 - 41 U/L 19  ALT 0 - 44 U/L 16   CBC Latest Ref Rng & Units 12/08/2018  WBC 4.0 - 10.5 K/uL 3.2(L)  Hemoglobin 12.0 - 15.0 g/dL 10.5(L)  Hematocrit 36.0 - 46.0 % 33.8(L)  Platelets 150 - 400 K/uL 109(L)    No images are attached to the encounter.  Ct Chest W Contrast  Result Date: 11/10/2018 CLINICAL DATA:  Restaging of small-cell lung cancer with brain metastasis. History of cirrhosis.  Appendectomy. Known bone metastasis. EXAM: CT CHEST, ABDOMEN, AND PELVIS WITH CONTRAST TECHNIQUE:  Multidetector CT imaging of the chest, abdomen and pelvis was performed following the standard protocol during bolus administration of intravenous contrast. CONTRAST:  164mL OMNIPAQUE IOHEXOL 300 MG/ML  SOLN COMPARISON:  09/22/2018 FINDINGS: CT CHEST FINDINGS Cardiovascular: Aortic atherosclerosis. Normal heart size, without pericardial effusion. Lipomatous hypertrophy of the interatrial septum. Multivessel coronary artery atherosclerosis. No central pulmonary embolism, on this non-dedicated study. Mediastinum/Nodes: No supraclavicular adenopathy. Right paratracheal node is decreased at 8 mm on 20/2 versus 10 mm on the prior exam. No hilar adenopathy. Lungs/Pleura: No pleural fluid.  Mild centrilobular emphysema. Posterior right upper lobe pulmonary nodule measures 4 mm on 42/4 and is similar to on the prior exam (when remeasured). Musculoskeletal: Redemonstration of sclerotic and less so lytic osseous metastasis. Healing seventh posterolateral left rib fracture. Compression deformity at T2 is mild-to-moderate and not significantly changed CT ABDOMEN PELVIS FINDINGS Hepatobiliary: Moderate cirrhosis. No focal liver lesion. Cholecystectomy, without biliary ductal dilatation. Pancreas: Normal, without mass or ductal dilatation. Spleen: Mild splenomegaly, 13.2 cm craniocaudal. Adrenals/Urinary Tract: Mild bilateral adrenal nodularity is unchanged. Interpolar left renal too small to characterize lesion. Normal right kidney, without hydronephrosis. Normal urinary bladder. Stomach/Bowel: Normal stomach, without wall thickening. Normal colon and terminal ileum. Normal small bowel. Vascular/Lymphatic: Nonaneurysmal dilatation of the infrarenal aorta at 2.1 cm. Aortic and branch vessel atherosclerosis. Patent portal and splenic veins. Reproductive: Normal uterus and adnexa. Other: No significant free fluid. Musculoskeletal:  Similar widespread sclerotic and less so lytic osseous metastasis. Moderate L2 compression deformity is not significantly changed. A mild superior endplate L1 compression deformity is also similar. Compression fracture at the superior aspect of the sacrum is also unchanged. IMPRESSION: 1. Similar size of a right upper lobe pulmonary nodule. Resolution of previously described borderline mediastinal adenopathy. 2. Widespread osseous metastasis, similar. 3. Cirrhosis and splenomegaly. 4. Aortic atherosclerosis (ICD10-I70.0), coronary artery atherosclerosis and emphysema (ICD10-J43.9). Electronically Signed   By: Abigail Miyamoto M.D.   On: 11/10/2018 13:08   Nm Bone Scan Whole Body  Result Date: 11/10/2018 CLINICAL DATA:  Small cell lung cancer RIGHT upper lobe, osseous metastasis EXAM: NUCLEAR MEDICINE WHOLE BODY BONE SCAN TECHNIQUE: Whole body anterior and posterior images were obtained approximately 3 hours after intravenous injection of radiopharmaceutical. RADIOPHARMACEUTICALS:  23.773 mCi Technetium-66m MDP IV COMPARISON:  09/22/2018 FINDINGS: Numerous sites of abnormal osseous tracer accumulation consistent with widespread osseous metastatic disease. These include calvaria, ribs, thoracic and lumbar spine, pelvis, femora, humeri. Many of these observed sites demonstrate less uptake of tracer than on the previous exam. Single new focus of tracer accumulation is identified at the mid LEFT femoral diaphysis, subtle. Paucity of soft tissue and urinary tract distribution of tracer indicating super scan appearance. IMPRESSION: Scattered osseous metastases, with the majority of the lesions demonstrating a lower degree of tracer accumulation on the current exam than was seen previously. Single tiny focus of uptake at the mid LEFT femoral diaphysis likely represents a new metastasis since the prior exam. Electronically Signed   By: Lavonia Dana M.D.   On: 11/10/2018 17:03   Ct Abdomen Pelvis W Contrast  Result Date:  11/10/2018 CLINICAL DATA:  Restaging of small-cell lung cancer with brain metastasis. History of cirrhosis. Appendectomy. Known bone metastasis. EXAM: CT CHEST, ABDOMEN, AND PELVIS WITH CONTRAST TECHNIQUE: Multidetector CT imaging of the chest, abdomen and pelvis was performed following the standard protocol during bolus administration of intravenous contrast. CONTRAST:  177mL OMNIPAQUE IOHEXOL 300 MG/ML  SOLN COMPARISON:  09/22/2018 FINDINGS: CT CHEST FINDINGS Cardiovascular: Aortic atherosclerosis. Normal heart size, without  pericardial effusion. Lipomatous hypertrophy of the interatrial septum. Multivessel coronary artery atherosclerosis. No central pulmonary embolism, on this non-dedicated study. Mediastinum/Nodes: No supraclavicular adenopathy. Right paratracheal node is decreased at 8 mm on 20/2 versus 10 mm on the prior exam. No hilar adenopathy. Lungs/Pleura: No pleural fluid.  Mild centrilobular emphysema. Posterior right upper lobe pulmonary nodule measures 4 mm on 42/4 and is similar to on the prior exam (when remeasured). Musculoskeletal: Redemonstration of sclerotic and less so lytic osseous metastasis. Healing seventh posterolateral left rib fracture. Compression deformity at T2 is mild-to-moderate and not significantly changed CT ABDOMEN PELVIS FINDINGS Hepatobiliary: Moderate cirrhosis. No focal liver lesion. Cholecystectomy, without biliary ductal dilatation. Pancreas: Normal, without mass or ductal dilatation. Spleen: Mild splenomegaly, 13.2 cm craniocaudal. Adrenals/Urinary Tract: Mild bilateral adrenal nodularity is unchanged. Interpolar left renal too small to characterize lesion. Normal right kidney, without hydronephrosis. Normal urinary bladder. Stomach/Bowel: Normal stomach, without wall thickening. Normal colon and terminal ileum. Normal small bowel. Vascular/Lymphatic: Nonaneurysmal dilatation of the infrarenal aorta at 2.1 cm. Aortic and branch vessel atherosclerosis. Patent portal and  splenic veins. Reproductive: Normal uterus and adnexa. Other: No significant free fluid. Musculoskeletal: Similar widespread sclerotic and less so lytic osseous metastasis. Moderate L2 compression deformity is not significantly changed. A mild superior endplate L1 compression deformity is also similar. Compression fracture at the superior aspect of the sacrum is also unchanged. IMPRESSION: 1. Similar size of a right upper lobe pulmonary nodule. Resolution of previously described borderline mediastinal adenopathy. 2. Widespread osseous metastasis, similar. 3. Cirrhosis and splenomegaly. 4. Aortic atherosclerosis (ICD10-I70.0), coronary artery atherosclerosis and emphysema (ICD10-J43.9). Electronically Signed   By: Abigail Miyamoto M.D.   On: 11/10/2018 13:08     Assessment and plan- Patient is a 68 y.o. female with extensive stage small cell lung cancer and bone metastases here for on treatment assessment prior to cycle 2 of maintenance Tecentriq  Comes to me to proceed with cycle 2 of maintenance Tecentriq today.  She does have moderate anemia and thrombocytopenia likely secondary to prior chemotherapy..  Continue to monitor  I will see her back in 3 weeks time for cycle 3 with CBC with differential, CMP  Bone metastases: Xgeva on hold since she had significant hypocalcemia for several weeks following that   Visit Diagnosis 1. Encounter for antineoplastic immunotherapy   2. Small cell lung cancer, right upper lobe ([place])      Dr. Randa Evens, MD, MPH [facility] [phone] 12/09/2018 10:58 AM

## 2018-12-26 ENCOUNTER — Other Ambulatory Visit: Payer: Self-pay

## 2018-12-26 NOTE — Progress Notes (Signed)
Patient pre screened for office appointment, no questions or concerns today. Patient reminded of upcoming appointment time and date. 

## 2018-12-29 ENCOUNTER — Other Ambulatory Visit: Payer: Self-pay

## 2018-12-29 ENCOUNTER — Inpatient Hospital Stay: Payer: PPO

## 2018-12-29 ENCOUNTER — Inpatient Hospital Stay (HOSPITAL_BASED_OUTPATIENT_CLINIC_OR_DEPARTMENT_OTHER): Payer: PPO | Admitting: Oncology

## 2018-12-29 VITALS — BP 146/72 | HR 78 | Temp 98.1°F | Wt 166.0 lb

## 2018-12-29 DIAGNOSIS — C3411 Malignant neoplasm of upper lobe, right bronchus or lung: Secondary | ICD-10-CM

## 2018-12-29 DIAGNOSIS — C7951 Secondary malignant neoplasm of bone: Secondary | ICD-10-CM | POA: Diagnosis not present

## 2018-12-29 DIAGNOSIS — Z95828 Presence of other vascular implants and grafts: Secondary | ICD-10-CM

## 2018-12-29 DIAGNOSIS — Z5112 Encounter for antineoplastic immunotherapy: Secondary | ICD-10-CM

## 2018-12-29 LAB — COMPREHENSIVE METABOLIC PANEL
ALT: 14 U/L (ref 0–44)
AST: 17 U/L (ref 15–41)
Albumin: 3.7 g/dL (ref 3.5–5.0)
Alkaline Phosphatase: 108 U/L (ref 38–126)
Anion gap: 5 (ref 5–15)
BUN: 10 mg/dL (ref 8–23)
CO2: 27 mmol/L (ref 22–32)
Calcium: 7.9 mg/dL — ABNORMAL LOW (ref 8.9–10.3)
Chloride: 107 mmol/L (ref 98–111)
Creatinine, Ser: 0.56 mg/dL (ref 0.44–1.00)
GFR calc Af Amer: >60 mL/min (ref >60)
GFR calc non Af Amer: >60 mL/min (ref >60)
Glucose, Bld: 86 mg/dL (ref 70–99)
Potassium: 3.7 mmol/L (ref 3.5–5.1)
Sodium: 139 mmol/L (ref 135–145)
Total Bilirubin: 0.6 mg/dL (ref 0.3–1.2)
Total Protein: 6.3 g/dL — ABNORMAL LOW (ref 6.5–8.1)

## 2018-12-29 LAB — CBC WITH DIFFERENTIAL/PLATELET
Abs Immature Granulocytes: 0.01 10*3/uL (ref 0.00–0.07)
Basophils Absolute: 0 10*3/uL (ref 0.0–0.1)
Basophils Relative: 1 %
Eosinophils Absolute: 0.1 10*3/uL (ref 0.0–0.5)
Eosinophils Relative: 2 %
HCT: 36.1 % (ref 36.0–46.0)
Hemoglobin: 11.2 g/dL — ABNORMAL LOW (ref 12.0–15.0)
Immature Granulocytes: 0 %
Lymphocytes Relative: 18 %
Lymphs Abs: 0.6 10*3/uL — ABNORMAL LOW (ref 0.7–4.0)
MCH: 30.2 pg (ref 26.0–34.0)
MCHC: 31 g/dL (ref 30.0–36.0)
MCV: 97.3 fL (ref 80.0–100.0)
Monocytes Absolute: 0.2 10*3/uL (ref 0.1–1.0)
Monocytes Relative: 6 %
Neutro Abs: 2.4 10*3/uL (ref 1.7–7.7)
Neutrophils Relative %: 73 %
Platelets: 110 10*3/uL — ABNORMAL LOW (ref 150–400)
RBC: 3.71 MIL/uL — ABNORMAL LOW (ref 3.87–5.11)
RDW: 14.3 % (ref 11.5–15.5)
WBC: 3.3 10*3/uL — ABNORMAL LOW (ref 4.0–10.5)
nRBC: 0 % (ref 0.0–0.2)

## 2018-12-29 MED ORDER — HEPARIN SOD (PORK) LOCK FLUSH 100 UNIT/ML IV SOLN
500.0000 [IU] | Freq: Once | INTRAVENOUS | Status: AC | PRN
  Administered 2018-12-29: 500 [IU]
  Filled 2018-12-29: qty 5

## 2018-12-29 MED ORDER — SODIUM CHLORIDE 0.9 % IV SOLN
Freq: Once | INTRAVENOUS | Status: AC
  Administered 2018-12-29: 09:00:00 via INTRAVENOUS
  Filled 2018-12-29: qty 250

## 2018-12-29 MED ORDER — SODIUM CHLORIDE 0.9 % IV SOLN
1200.0000 mg | Freq: Once | INTRAVENOUS | Status: AC
  Administered 2018-12-29: 1200 mg via INTRAVENOUS
  Filled 2018-12-29: qty 20

## 2018-12-29 MED ORDER — SODIUM CHLORIDE 0.9% FLUSH
10.0000 mL | Freq: Once | INTRAVENOUS | Status: AC
  Administered 2018-12-29: 10 mL via INTRAVENOUS
  Filled 2018-12-29: qty 10

## 2018-12-29 NOTE — Progress Notes (Signed)
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 She has been referred to [place] for findings of bone metastases and her recent MRI. She has a prior history of 3 packs/day day smoking for over 45 years and quit smoking 5 years ago.She had a CT chest abdomen pelvis in 2018 which showed a 5 mm lung nodule in the left lower lobe. Recently over the last 2 months patient has been having worsening back pain and was referred to orthopedics. 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 Pathologic fracture of S1.  PET scan showed 2 RUL lung nodules, hilar and mediastinal adenopathy and widespread bone mets. MRI brain negative.  Patientcompletedpalliative RT to her spine. Bronchoscopy showed small cell lung cancer. Palliative carboplatin, etoposide and Tecentriq started on 08/18/2018   Interval history-feels fatigued but denies other complaints.  She still has ongoing low back pain for which she takes as needed oxycodone.  ECOG PS- 1 Pain scale- 0 Opioid associated constipation- no  Review of systems- Review of Systems   Constitutional: Positive for malaise/fatigue. Negative for chills, fever and weight loss.  HENT: Negative for congestion, ear discharge and nosebleeds.   Eyes: Negative for blurred vision.  Respiratory: Negative for cough, hemoptysis, sputum production, shortness of breath and wheezing.   Cardiovascular: Negative for chest pain, palpitations, orthopnea and claudication.  Gastrointestinal: Negative for abdominal pain, blood in stool, constipation, diarrhea, heartburn, melena, nausea and vomiting.  Genitourinary: Negative for dysuria, flank pain, frequency, hematuria and urgency.  Musculoskeletal: Negative for back pain, joint pain and myalgias.  Skin: Negative for rash.  Neurological: Negative for dizziness, tingling, focal weakness, seizures, weakness and headaches.  Endo/Heme/Allergies: Does not bruise/bleed easily.  Psychiatric/Behavioral: Negative for depression and suicidal ideas. The patient does not have insomnia.       Allergies  Allergen Reactions  . Bee Venom Swelling     Past Medical History:  Diagnosis Date  . Arthritis   . Cirrhosis ([place])   . Depression   . GERD (gastroesophageal reflux disease)   . Hyperlipidemia   . Hypertension   . Lung cancer ([place])   . Metastatic bone cancer [facility])      Past Surgical History:  Procedure Laterality Date  . APPENDECTOMY  1971  . CHOLECYSTECTOMY  1971  . COLONOSCOPY WITH PROPOFOL N/A 11/15/2016   Procedure: COLONOSCOPY WITH PROPOFOL;  Surgeon: Jonathon Bellows, MD;  Location[facility] ENDOSCOPY;  Service: Gastroenterology;  Laterality: N/A;  . ENDOBRONCHIAL ULTRASOUND Right 07/30/2018   Procedure: ENDOBRONCHIAL ULTRASOUND;  Surgeon: Laverle Hobby, MD;  Location: [facility] ORS;  Service: Pulmonary;  Laterality: Right;  . ESOPHAGOGASTRODUODENOSCOPY (EGD) WITH PROPOFOL N/A 01/07/2017   Procedure: ESOPHAGOGASTRODUODENOSCOPY (EGD) WITH PROPOFOL;  Surgeon: Jonathon Bellows, MD;  Location: [facility] ENDOSCOPY;  Service: Gastroenterology;  Laterality:  N/A;  . LAPAROSCOPY N/A 03/01/2017   Procedure: LAPAROSCOPY DIAGNOSTIC;  Surgeon: Robert Bellow, MD;  Location: [facility] ORS;  Service: General;  Laterality: N/A;  . PORTA CATH INSERTION N/A 08/14/2018   Procedure: PORTA CATH INSERTION;  Surgeon: Algernon Huxley, MD;  Location: [place] CV LAB;  Service: Cardiovascular;  Laterality: N/A;  . TONSILECTOMY, ADENOIDECTOMY, BILATERAL MYRINGOTOMY [place]  . TONSILLECTOMY    . VENTRAL HERNIA REPAIR N/A 03/01/2017   10 x 14 CM Ventralight ST mesh, intraperitoneal location.   . VENTRAL HERNIA REPAIR N/A 03/01/2017   Procedure: HERNIA REPAIR VENTRAL ADULT;  Surgeon: Robert Bellow, MD;  Location: [facility] ORS;  Service: General;  Laterality: N/A;    Social History   Socioeconomic History  . Marital status: Married    Spouse name: [name]  . Number of children: Not on file  . Years of education: Not on file  . Highest education level: Not on file  Occupational History  . Not on file  Social Needs  . Financial resource strain: Not hard at all  . Food insecurity    Worry: Never true    Inability: Never true  . Transportation needs    Medical: No    Non-medical: No  Tobacco Use  . Smoking status: Former Smoker    Packs/day: 2.00    Years: 50.00    Pack years: 100.00    Types: Cigarettes, E-cigarettes    Quit date: 02/07/2012    Years since quitting: 6.8  . Smokeless tobacco: Never Used  Substance and Sexual Activity  . Alcohol use: No    Alcohol/week: 0.0 standard drinks  . Drug use: No  . Sexual activity: Yes  Lifestyle  . Physical activity    Days per week: Patient refused    Minutes per session: Patient refused  . Stress: Not at all  Relationships  . Social Herbalist on phone: Patient refused    Gets together: Patient refused    Attends religious service: Patient refused    Active member of club or organization: Patient refused    Attends meetings of clubs or organizations: Patient refused    Relationship  status: Patient refused  . Intimate partner violence    Fear of current or ex partner: No    Emotionally abused: No    Physically abused: No    Forced sexual activity: No  Other Topics Concern  . Not on file  Social History Narrative   Married   Retired   Clinical cytogeneticist level of education    No children    1 cup of coffee    Family History  Problem Relation Age of Onset  . Hypertension Mother   . Ovarian cancer Mother 55  . Heart disease Father   . Stroke Father   . Ovarian cancer Sister        ? dx cancer had hyst.  . Breast cancer Neg Hx      Current Outpatient Medications:  .  escitalopram (LEXAPRO) 10 MG tablet, Take 1 tablet (10 mg total) by mouth daily., Disp: 90 tablet, Rfl: 1 .  feeding supplement, ENSURE ENLIVE, (ENSURE ENLIVE) LIQD, Take 237 mLs by mouth 2 (two) times daily between meals., Disp: 237 mL, Rfl: 12 .  folic acid (FOLVITE) 1 MG tablet, Take 1 tablet (1 mg total) by mouth daily., Disp: 30 tablet, Rfl: 3 .  ibuprofen (ADVIL,MOTRIN) 200 MG tablet, Take 400 mg  by mouth every 8 (eight) hours as needed for headache or moderate pain. , Disp: , Rfl:  .  lidocaine-prilocaine (EMLA) cream, Apply to affected area once, Disp: 30 g, Rfl: 3 .  loperamide (IMODIUM) 2 MG capsule, Take 1 capsule (2 mg total) by mouth every 2 (two) hours as needed for diarrhea or loose stools. Do not exceed 16mg  daily., Disp: 30 capsule, Rfl: 1 .  Multiple Vitamins-Minerals (PRESERVISION AREDS 2) CAPS, Take 1 capsule by mouth 2 (two) times a day. , Disp: , Rfl:  .  ondansetron (ZOFRAN) 8 MG tablet, Take 1 tablet (8 mg total) by mouth 2 (two) times daily as needed for refractory nausea / vomiting. Start on day 3 after carboplatin chemo., Disp: 30 tablet, Rfl: 1 .  polyethylene glycol (MIRALAX / GLYCOLAX) 17 g packet, Take 17 g by mouth daily as needed for mild constipation., Disp: 14 each, Rfl: 0 .  prochlorperazine (COMPAZINE) 10 MG tablet, Take 1 tablet (10 mg total) by mouth every 6  (six) hours as needed (Nausea or vomiting). (Patient not taking: Reported on 12/05/2018), Disp: 30 tablet, Rfl: 1 .  rosuvastatin (CRESTOR) 20 MG tablet, TAKE 1 TABLET BY MOUTH ONCE DAILY (Patient taking differently: Take 20 mg by mouth daily. ), Disp: 90 tablet, Rfl: 3 .  silver sulfADIAZINE (SILVADENE) 1 % cream, Apply topically 2 (two) times daily., Disp: 50 g, Rfl: 0  Physical exam:  Vitals:   12/29/18 0847  BP: (!) 146/72  Pulse: 78  Temp: 98.1 F (36.7 C)  TempSrc: Tympanic  SpO2: 98%  Weight: 166 lb (75.3 kg)   Physical Exam Constitutional:      General: She is not in acute distress. HENT:     Head: Normocephalic and atraumatic.  Eyes:     Pupils: Pupils are equal, round, and reactive to light.  Neck:     Musculoskeletal: Normal range of motion.  Cardiovascular:     Rate and Rhythm: Normal rate and regular rhythm.     Heart sounds: Normal heart sounds.  Pulmonary:     Effort: Pulmonary effort is normal.     Breath sounds: Normal breath sounds.  Abdominal:     General: Bowel sounds are normal.     Palpations: Abdomen is soft.  Skin:    General: Skin is warm and dry.  Neurological:     Mental Status: She is alert and oriented to person, place, and time.      CMP Latest Ref Rng & Units 12/29/2018  Glucose 70 - 99 mg/dL 86  BUN 8 - 23 mg/dL 10  Creatinine 0.44 - 1.00 mg/dL 0.56  Sodium 135 - 145 mmol/L 139  Potassium 3.5 - 5.1 mmol/L 3.7  Chloride 98 - 111 mmol/L 107  CO2 22 - 32 mmol/L 27  Calcium 8.9 - 10.3 mg/dL 7.9(L)  Total Protein 6.5 - 8.1 g/dL 6.3(L)  Total Bilirubin 0.3 - 1.2 mg/dL 0.6  Alkaline Phos 38 - 126 U/L 108  AST 15 - 41 U/L 17  ALT 0 - 44 U/L 14   CBC Latest Ref Rng & Units 12/29/2018  WBC 4.0 - 10.5 K/uL 3.3(L)  Hemoglobin 12.0 - 15.0 g/dL 11.2(L)  Hematocrit 36.0 - 46.0 % 36.1  Platelets 150 - 400 K/uL 110(L)      Assessment and plan- Patient is a 68 y.o. female with extensive stage small cell lung cancer and bone metastases here  for on treatment assessment prior to cycle 3 of maintenance Tecentriq  Patient has  mild persistent leukopenia and thrombocytopenia likely secondary to prior chemotherapy which is overall stable.  Counts are otherwise okay to proceed with cycle 3 of maintenance Tecentriq today.  I will see him back in 3 weeks time with CBC with differential, CMP for cycle 4.  Plan to get scans after cycle 4  Bone metastases: Xgeva on hold as patient developed severe hypocalcemia for several weeks following that   Visit Diagnosis 1. Small cell lung cancer, right upper lobe ([place])   2. Bone metastases ([place])   3. Encounter for antineoplastic immunotherapy      Dr. Randa Evens, MD, MPH [facility] [phone] 12/30/2018 8:27 AM

## 2018-12-29 NOTE — Progress Notes (Signed)
Patient stated that she had been doing well with no complaints. 

## 2018-12-30 ENCOUNTER — Encounter: Payer: Self-pay | Admitting: Oncology

## 2019-01-06 ENCOUNTER — Other Ambulatory Visit: Payer: Self-pay

## 2019-01-07 ENCOUNTER — Encounter: Payer: Self-pay | Admitting: Radiation Oncology

## 2019-01-07 ENCOUNTER — Ambulatory Visit
Admission: RE | Admit: 2019-01-07 | Discharge: 2019-01-07 | Disposition: A | Discharge status: Home / Self Care | Payer: PPO | Source: Ambulatory Visit | Attending: Radiation Oncology | Admitting: Radiation Oncology

## 2019-01-07 ENCOUNTER — Other Ambulatory Visit: Payer: Self-pay

## 2019-01-07 VITALS — BP 134/83 | HR 75 | Temp 95.5°F | Resp 16 | Wt 170.7 lb

## 2019-01-07 DIAGNOSIS — Z87891 Personal history of nicotine dependence: Secondary | ICD-10-CM | POA: Insufficient documentation

## 2019-01-07 DIAGNOSIS — C3491 Malignant neoplasm of unspecified part of right bronchus or lung: Secondary | ICD-10-CM | POA: Insufficient documentation

## 2019-01-07 DIAGNOSIS — C7951 Secondary malignant neoplasm of bone: Secondary | ICD-10-CM

## 2019-01-07 NOTE — Progress Notes (Signed)
Radiation Oncology Follow up Note  Name: Anne [name]   Date:   01/07/2019 MRN:  [id number] DOB: [birth year]    This 68 y.o. female presents to the clinic today for 1 month follow-up status post palliative radiation therapy to.  Left hip for stage IV small cell lung cancer  REFERRING PROVIDER: Leone Haven, MD  HPI: Patient is a 68 year old female now seen out 1 month having completed single fraction palliative radiation therapy to her life left femur for metastatic involvement of extensive stage small cell lung cancer.  Seen today in routine follow-up she is doing well she specifically denies any evidence of hip pain.  She is ambulating well without assistance.  She is having no other areas of bony pain..  She is currently on cycle 3 of maintenance Tecentriq which she is tolerating well.  COMPLICATIONS OF TREATMENT: none  FOLLOW UP COMPLIANCE: keeps appointments   PHYSICAL EXAM:  BP 134/83 (BP Location: Left Arm, Patient Position: Sitting)   Pulse 75   Temp (!) 95.5 F (35.3 C) (Tympanic)   Resp 16   Wt 170 lb 11.2 oz (77.4 kg)   BMI 28.41 kg/m  Range of motion of her lower extremities does not elicit pain.  Motor or sensory and DTR levels are equal symmetric in lower extremities.  Proprioception is intact.  Well-developed well-nourished patient in NAD. HEENT reveals PERLA, EOMI, discs not visualized.  Oral cavity is clear. No oral mucosal lesions are identified. Neck is clear without evidence of cervical or supraclavicular adenopathy. Lungs are clear to A&P. Cardiac examination is essentially unremarkable with regular rate and rhythm without murmur rub or thrill. Abdomen is benign with no organomegaly or masses noted. Motor sensory and DTR levels are equal and symmetric in the upper and lower extremities. Cranial nerves II through XII are grossly intact. Proprioception is intact. No peripheral adenopathy or edema is identified. No motor or sensory levels are noted. Crude visual  fields are within normal range. RADIOLOGY RESULTS: No current films for review  PLAN: Present time she is achieved good palliation with single fraction palliative treatment to her left femur.  At this time I am going to turn follow-up care over to medical oncology.  I be happy to reevaluate the patient anytime should further palliative treatment be indicated.  I would like to take this opportunity to thank you for allowing me to participate in the care of your patient.Noreene Filbert, MD

## 2019-01-14 ENCOUNTER — Ambulatory Visit: Payer: PPO | Admitting: Family Medicine

## 2019-01-16 ENCOUNTER — Encounter: Payer: Self-pay | Admitting: Oncology

## 2019-01-16 ENCOUNTER — Other Ambulatory Visit: Payer: Self-pay

## 2019-01-16 NOTE — Progress Notes (Signed)
Patient stated that she had been doing well with no concerns. 

## 2019-01-18 ENCOUNTER — Other Ambulatory Visit: Payer: Self-pay | Admitting: *Deleted

## 2019-01-18 DIAGNOSIS — Z2989 Encounter for other specified prophylactic measures: Secondary | ICD-10-CM

## 2019-01-18 DIAGNOSIS — C3411 Malignant neoplasm of upper lobe, right bronchus or lung: Secondary | ICD-10-CM

## 2019-01-18 DIAGNOSIS — Z298 Encounter for other specified prophylactic measures: Secondary | ICD-10-CM

## 2019-01-19 ENCOUNTER — Encounter: Payer: Self-pay | Admitting: *Deleted

## 2019-01-19 ENCOUNTER — Other Ambulatory Visit: Payer: Self-pay

## 2019-01-19 ENCOUNTER — Other Ambulatory Visit: Payer: Self-pay | Admitting: *Deleted

## 2019-01-19 ENCOUNTER — Inpatient Hospital Stay (HOSPITAL_BASED_OUTPATIENT_CLINIC_OR_DEPARTMENT_OTHER): Payer: PPO | Admitting: Oncology

## 2019-01-19 ENCOUNTER — Inpatient Hospital Stay: Payer: PPO

## 2019-01-19 ENCOUNTER — Other Ambulatory Visit: Payer: Self-pay | Admitting: Family Medicine

## 2019-01-19 ENCOUNTER — Inpatient Hospital Stay: Payer: PPO | Attending: Oncology

## 2019-01-19 VITALS — BP 147/95 | HR 74 | Temp 97.8°F | Wt 170.0 lb

## 2019-01-19 DIAGNOSIS — Z8249 Family history of ischemic heart disease and other diseases of the circulatory system: Secondary | ICD-10-CM | POA: Diagnosis not present

## 2019-01-19 DIAGNOSIS — Z791 Long term (current) use of non-steroidal anti-inflammatories (NSAID): Secondary | ICD-10-CM | POA: Insufficient documentation

## 2019-01-19 DIAGNOSIS — E785 Hyperlipidemia, unspecified: Secondary | ICD-10-CM | POA: Insufficient documentation

## 2019-01-19 DIAGNOSIS — E063 Autoimmune thyroiditis: Secondary | ICD-10-CM

## 2019-01-19 DIAGNOSIS — M549 Dorsalgia, unspecified: Secondary | ICD-10-CM | POA: Diagnosis not present

## 2019-01-19 DIAGNOSIS — C3411 Malignant neoplasm of upper lobe, right bronchus or lung: Secondary | ICD-10-CM

## 2019-01-19 DIAGNOSIS — Z298 Encounter for other specified prophylactic measures: Secondary | ICD-10-CM

## 2019-01-19 DIAGNOSIS — E039 Hypothyroidism, unspecified: Secondary | ICD-10-CM | POA: Insufficient documentation

## 2019-01-19 DIAGNOSIS — Z87891 Personal history of nicotine dependence: Secondary | ICD-10-CM | POA: Diagnosis not present

## 2019-01-19 DIAGNOSIS — K219 Gastro-esophageal reflux disease without esophagitis: Secondary | ICD-10-CM | POA: Insufficient documentation

## 2019-01-19 DIAGNOSIS — K7460 Unspecified cirrhosis of liver: Secondary | ICD-10-CM | POA: Diagnosis not present

## 2019-01-19 DIAGNOSIS — Z5112 Encounter for antineoplastic immunotherapy: Secondary | ICD-10-CM | POA: Insufficient documentation

## 2019-01-19 DIAGNOSIS — Z79899 Other long term (current) drug therapy: Secondary | ICD-10-CM | POA: Diagnosis not present

## 2019-01-19 DIAGNOSIS — I10 Essential (primary) hypertension: Secondary | ICD-10-CM | POA: Diagnosis not present

## 2019-01-19 DIAGNOSIS — R7989 Other specified abnormal findings of blood chemistry: Secondary | ICD-10-CM

## 2019-01-19 DIAGNOSIS — Z923 Personal history of irradiation: Secondary | ICD-10-CM | POA: Insufficient documentation

## 2019-01-19 DIAGNOSIS — Z95828 Presence of other vascular implants and grafts: Secondary | ICD-10-CM

## 2019-01-19 DIAGNOSIS — F329 Major depressive disorder, single episode, unspecified: Secondary | ICD-10-CM | POA: Insufficient documentation

## 2019-01-19 DIAGNOSIS — C7951 Secondary malignant neoplasm of bone: Secondary | ICD-10-CM | POA: Diagnosis not present

## 2019-01-19 LAB — CBC WITH DIFFERENTIAL/PLATELET
Abs Immature Granulocytes: 0.01 10*3/uL (ref 0.00–0.07)
Basophils Absolute: 0 10*3/uL (ref 0.0–0.1)
Basophils Relative: 1 %
Eosinophils Absolute: 0.1 10*3/uL (ref 0.0–0.5)
Eosinophils Relative: 3 %
HCT: 38.9 % (ref 36.0–46.0)
Hemoglobin: 11.9 g/dL — ABNORMAL LOW (ref 12.0–15.0)
Immature Granulocytes: 0 %
Lymphocytes Relative: 22 %
Lymphs Abs: 0.7 10*3/uL (ref 0.7–4.0)
MCH: 29.5 pg (ref 26.0–34.0)
MCHC: 30.6 g/dL (ref 30.0–36.0)
MCV: 96.3 fL (ref 80.0–100.0)
Monocytes Absolute: 0.2 10*3/uL (ref 0.1–1.0)
Monocytes Relative: 5 %
Neutro Abs: 2.2 10*3/uL (ref 1.7–7.7)
Neutrophils Relative %: 69 %
Platelets: 118 10*3/uL — ABNORMAL LOW (ref 150–400)
RBC: 4.04 MIL/uL (ref 3.87–5.11)
RDW: 14.4 % (ref 11.5–15.5)
WBC: 3.2 10*3/uL — ABNORMAL LOW (ref 4.0–10.5)
nRBC: 0 % (ref 0.0–0.2)

## 2019-01-19 LAB — COMPREHENSIVE METABOLIC PANEL
ALT: 21 U/L (ref 0–44)
AST: 25 U/L (ref 15–41)
Albumin: 4 g/dL (ref 3.5–5.0)
Alkaline Phosphatase: 110 U/L (ref 38–126)
Anion gap: 8 (ref 5–15)
BUN: 11 mg/dL (ref 8–23)
CO2: 27 mmol/L (ref 22–32)
Calcium: 8.6 mg/dL — ABNORMAL LOW (ref 8.9–10.3)
Chloride: 107 mmol/L (ref 98–111)
Creatinine, Ser: 0.71 mg/dL (ref 0.44–1.00)
GFR calc Af Amer: >60 mL/min (ref >60)
GFR calc non Af Amer: >60 mL/min (ref >60)
Glucose, Bld: 90 mg/dL (ref 70–99)
Potassium: 3.6 mmol/L (ref 3.5–5.1)
Sodium: 142 mmol/L (ref 135–145)
Total Bilirubin: 0.6 mg/dL (ref 0.3–1.2)
Total Protein: 7.2 g/dL (ref 6.5–8.1)

## 2019-01-19 LAB — T4, FREE: Free T4: 0.32 ng/dL — ABNORMAL LOW (ref 0.61–1.12)

## 2019-01-19 LAB — TSH: TSH: 40.35 u[IU]/mL — ABNORMAL HIGH (ref 0.350–4.500)

## 2019-01-19 MED ORDER — HEPARIN SOD (PORK) LOCK FLUSH 100 UNIT/ML IV SOLN
500.0000 [IU] | Freq: Once | INTRAVENOUS | Status: AC | PRN
  Administered 2019-01-19: 500 [IU]

## 2019-01-19 MED ORDER — LEVOTHYROXINE SODIUM 50 MCG PO TABS: 50.0000 ug | ORAL_TABLET | Freq: Every day | ORAL | 1 refills | Status: DC

## 2019-01-19 MED ORDER — LEVOTHYROXINE SODIUM 25 MCG PO TABS: 25.0000 ug | ORAL_TABLET | Freq: Every day | ORAL | 1 refills | Status: DC

## 2019-01-19 MED ORDER — SODIUM CHLORIDE 0.9 % IV SOLN
Freq: Once | INTRAVENOUS | Status: AC
  Administered 2019-01-19: 10:00:00 via INTRAVENOUS
  Filled 2019-01-19: qty 250

## 2019-01-19 MED ORDER — SODIUM CHLORIDE 0.9 % IV SOLN
1200.0000 mg | Freq: Once | INTRAVENOUS | Status: AC
  Administered 2019-01-19: 10:00:00 1200 mg via INTRAVENOUS
  Filled 2019-01-19: qty 20

## 2019-01-19 MED ORDER — SODIUM CHLORIDE 0.9% FLUSH
10.0000 mL | Freq: Once | INTRAVENOUS | Status: AC
  Administered 2019-01-19: 10 mL via INTRAVENOUS
  Filled 2019-01-19: qty 10

## 2019-01-19 NOTE — Progress Notes (Signed)
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 She has been referred to [place] for findings of bone metastases and her recent MRI. She has a prior history of 3 packs/day day smoking for over 45 years and quit smoking 5 years ago.She had a CT chest abdomen pelvis in 2018 which showed a 5 mm lung nodule in the left lower lobe. Recently over the last 2 months patient has been having worsening back pain and was referred to orthopedics. 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 Pathologic fracture of S1.  PET scan showed 2 RUL lung nodules, hilar and mediastinal adenopathy and widespread bone mets. MRI brain negative.  Patientcompletedpalliative RT to her spine. Bronchoscopy showed small cell lung cancer. Palliative carboplatin, etoposide and Tecentriq started on 08/18/2018   Interval history-patient feels well and denies any complaints overall.  Back pain has remained stable.  Bowel movements are regular.  ECOG PS- 1 Pain scale- 0   Review of systems- Review of Systems  Constitutional: Negative for chills, fever,  malaise/fatigue and weight loss.  HENT: Negative for congestion, ear discharge and nosebleeds.   Eyes: Negative for blurred vision.  Respiratory: Negative for cough, hemoptysis, sputum production, shortness of breath and wheezing.   Cardiovascular: Negative for chest pain, palpitations, orthopnea and claudication.  Gastrointestinal: Negative for abdominal pain, blood in stool, constipation, diarrhea, heartburn, melena, nausea and vomiting.  Genitourinary: Negative for dysuria, flank pain, frequency, hematuria and urgency.  Musculoskeletal: Negative for back pain, joint pain and myalgias.  Skin: Negative for rash.  Neurological: Negative for dizziness, tingling, focal weakness, seizures, weakness and headaches.  Endo/Heme/Allergies: Does not bruise/bleed easily.  Psychiatric/Behavioral: Negative for depression and suicidal ideas. The patient does not have insomnia.       Allergies  Allergen Reactions  . Bee Venom Swelling     Past Medical History:  Diagnosis Date  . Arthritis   . Cirrhosis ([place])   . Depression   . GERD (gastroesophageal reflux disease)   . Hyperlipidemia   . Hypertension   . Lung cancer ([place])   . Metastatic bone cancer [facility])      Past Surgical History:  Procedure Laterality Date  . APPENDECTOMY  1971  . CHOLECYSTECTOMY  1971  . COLONOSCOPY WITH PROPOFOL N/A 11/15/2016   Procedure: COLONOSCOPY WITH PROPOFOL;  Surgeon: Jonathon Bellows, MD;  Location: [facility] ENDOSCOPY;  Service: Gastroenterology;  Laterality: N/A;  . ENDOBRONCHIAL ULTRASOUND Right 07/30/2018   Procedure: ENDOBRONCHIAL ULTRASOUND;  Surgeon: Laverle Hobby, MD;  Location: [facility] ORS;  Service: Pulmonary;  Laterality: Right;  . ESOPHAGOGASTRODUODENOSCOPY (EGD) WITH PROPOFOL N/A 01/07/2017   Procedure: ESOPHAGOGASTRODUODENOSCOPY (EGD) WITH PROPOFOL;  Surgeon: Jonathon Bellows, MD;  Location: [facility] ENDOSCOPY;  Service: Gastroenterology;  Laterality: N/A;  .  LAPAROSCOPY N/A 03/01/2017   Procedure:  LAPAROSCOPY DIAGNOSTIC;  Surgeon: Robert Bellow, MD;  Location: [facility] ORS;  Service: General;  Laterality: N/A;  . PORTA CATH INSERTION N/A 08/14/2018   Procedure: PORTA CATH INSERTION;  Surgeon: Algernon Huxley, MD;  Location: [place] CV LAB;  Service: Cardiovascular;  Laterality: N/A;  . TONSILECTOMY, ADENOIDECTOMY, BILATERAL MYRINGOTOMY [place]  . TONSILLECTOMY    . VENTRAL HERNIA REPAIR N/A 03/01/2017   10 x 14 CM Ventralight ST mesh, intraperitoneal location.   . VENTRAL HERNIA REPAIR N/A 03/01/2017   Procedure: HERNIA REPAIR VENTRAL ADULT;  Surgeon: Robert Bellow, MD;  Location: [facility] ORS;  Service: General;  Laterality: N/A;    Social History   Socioeconomic History  . Marital status: Married    Spouse name: [name]  . Number of children: Not on file  . Years of education: Not on file  . Highest education level: Not on file  Occupational History  . Not on file  Tobacco Use  . Smoking status: Former Smoker    Packs/day: 2.00    Years: 50.00    Pack years: 100.00    Types: Cigarettes, E-cigarettes    Quit date: 02/07/2012    Years since quitting: 6.9  . Smokeless tobacco: Never Used  Substance and Sexual Activity  . Alcohol use: No    Alcohol/week: 0.0 standard drinks  . Drug use: No  . Sexual activity: Yes  Other Topics Concern  . Not on file  Social History Narrative   Married   Retired   Clinical cytogeneticist level of education    No children    1 cup of coffee   Social Determinants of Radio broadcast assistant Strain:   . Difficulty of Paying Living Expenses: Not on file  Food Insecurity:   . Worried About Charity fundraiser in the Last Year: Not on file  . Ran Out of Food in the Last Year: Not on file  Transportation Needs:   . Lack of Transportation (Medical): Not on file  . Lack of Transportation (Non-Medical): Not on file  Physical Activity: Unknown  . Days of Exercise per Week: Patient refused  . Minutes of Exercise per Session:  Patient refused  Stress:   . Feeling of Stress : Not on file  Social Connections: Unknown  . Frequency of Communication with Friends and Family: Patient refused  . Frequency of Social Gatherings with Friends and Family: Patient refused  . Attends Religious Services: Patient refused  . Active Member of Clubs or Organizations: Patient refused  . Attends Archivist Meetings: Patient refused  . Marital Status: Patient refused  Intimate Partner Violence:   . Fear of Current or Ex-Partner: Not on file  . Emotionally Abused: Not on file  . Physically Abused: Not on file  . Sexually Abused: Not on file    Family History  Problem Relation Age of Onset  . Hypertension Mother   . Ovarian cancer Mother 31  . Heart disease Father   . Stroke Father   . Ovarian cancer Sister        ? dx cancer had hyst.  . Breast cancer Neg Hx      Current Outpatient Medications:  .  escitalopram (LEXAPRO) 10 MG tablet, Take 1 tablet (10 mg total) by mouth daily., Disp: 90 tablet, Rfl: 1 .  feeding supplement, ENSURE ENLIVE, (ENSURE ENLIVE) LIQD, Take 237 mLs by mouth 2 (two) times daily between meals., Disp:  237 mL, Rfl: 12 .  folic acid (FOLVITE) 1 MG tablet, Take 1 tablet (1 mg total) by mouth daily., Disp: 30 tablet, Rfl: 3 .  ibuprofen (ADVIL,MOTRIN) 200 MG tablet, Take 400 mg by mouth every 8 (eight) hours as needed for headache or moderate pain. , Disp: , Rfl:  .  lidocaine-prilocaine (EMLA) cream, Apply to affected area once, Disp: 30 g, Rfl: 3 .  loperamide (IMODIUM) 2 MG capsule, Take 1 capsule (2 mg total) by mouth every 2 (two) hours as needed for diarrhea or loose stools. Do not exceed 16mg  daily., Disp: 30 capsule, Rfl: 1 .  Multiple Vitamins-Minerals (PRESERVISION AREDS 2) CAPS, Take 1 capsule by mouth 2 (two) times a day. , Disp: , Rfl:  .  polyethylene glycol (MIRALAX / GLYCOLAX) 17 g packet, Take 17 g by mouth daily as needed for mild constipation., Disp: 14 each, Rfl: 0 .   levothyroxine (SYNTHROID) 50 MCG tablet, Take 1 tablet (50 mcg total) by mouth daily before breakfast., Disp: 30 tablet, Rfl: 1 .  ondansetron (ZOFRAN) 8 MG tablet, Take 1 tablet (8 mg total) by mouth 2 (two) times daily as needed for refractory nausea / vomiting. Start on day 3 after carboplatin chemo. (Patient not taking: Reported on 01/16/2019), Disp: 30 tablet, Rfl: 1 .  prochlorperazine (COMPAZINE) 10 MG tablet, Take 1 tablet (10 mg total) by mouth every 6 (six) hours as needed (Nausea or vomiting). (Patient not taking: Reported on 12/05/2018), Disp: 30 tablet, Rfl: 1 .  rosuvastatin (CRESTOR) 20 MG tablet, Take 1 tablet by mouth once daily, Disp: 90 tablet, Rfl: 0 .  silver sulfADIAZINE (SILVADENE) 1 % cream, Apply topically 2 (two) times daily. (Patient not taking: Reported on 01/16/2019), Disp: 50 g, Rfl: 0  Physical exam:  Vitals:   01/19/19 0929  BP: (!) 147/95  Pulse: 74  Temp: 97.8 F (36.6 C)  TempSrc: Tympanic  Weight: 170 lb (77.1 kg)   Physical Exam Constitutional:      General: She is not in acute distress. HENT:     Head: Normocephalic and atraumatic.  Eyes:     Pupils: Pupils are equal, round, and reactive to light.  Cardiovascular:     Rate and Rhythm: Normal rate and regular rhythm.     Heart sounds: Normal heart sounds.  Pulmonary:     Effort: Pulmonary effort is normal.     Breath sounds: Normal breath sounds.  Abdominal:     General: Bowel sounds are normal.     Palpations: Abdomen is soft.  Musculoskeletal:     Cervical back: Normal range of motion.  Skin:    General: Skin is warm and dry.  Neurological:     Mental Status: She is alert and oriented to person, place, and time.      CMP Latest Ref Rng & Units 01/19/2019  Glucose 70 - 99 mg/dL 90  BUN 8 - 23 mg/dL 11  Creatinine 0.44 - 1.00 mg/dL 0.71  Sodium 135 - 145 mmol/L 142  Potassium 3.5 - 5.1 mmol/L 3.6  Chloride 98 - 111 mmol/L 107  CO2 22 - 32 mmol/L 27  Calcium 8.9 - 10.3 mg/dL 8.6(L)    Total Protein 6.5 - 8.1 g/dL 7.2  Total Bilirubin 0.3 - 1.2 mg/dL 0.6  Alkaline Phos 38 - 126 U/L 110  AST 15 - 41 U/L 25  ALT 0 - 44 U/L 21   CBC Latest Ref Rng & Units 01/19/2019  WBC 4.0 - 10.5 K/uL 3.2(L)  Hemoglobin 12.0 - 15.0 g/dL 11.9(L)  Hematocrit 36.0 - 46.0 % 38.9  Platelets 150 - 400 K/uL 118(L)     Assessment and plan- Patient is a 68 y.o. female with extensive stage small cell lung cancer and bone metastases.  She is here for on treatment assessment prior to cycle 4 of maintenance Tecentriq  Counts okay to proceed with cycle 4 of maintenance Tecentriq today.  I will obtain repeat CT chest abdomen pelvis with contrast and bone scan in 2 weeks and see her back in 3 weeks time for possible Tecentriq.  After patient received her infusion today her TSH came back elevated at 40.  Her prior TSH was normal.  Free T3 is pending but free T4 was low. 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  I will start her on 50 mcg of levothyroxine today and repeat her TSH in 6 weeks time.  Immunotherapy associated hypothyroidism is usually permanent and does not necessarily warrant cessation of treatment.   Visit Diagnosis 1. Small cell lung cancer, right upper lobe ([place])   2. Encounter for antineoplastic immunotherapy   3. Autoimmune hypothyroidism      Dr. Randa Evens, MD, MPH [facility] at [facility] [phone] 01/19/2019 1:13 PM

## 2019-01-20 LAB — T3, FREE: T3, Free: 1.1 pg/mL — ABNORMAL LOW (ref 2.0–4.4)

## 2019-02-09 ENCOUNTER — Ambulatory Visit: Payer: PPO

## 2019-02-09 ENCOUNTER — Other Ambulatory Visit: Payer: PPO

## 2019-02-09 ENCOUNTER — Ambulatory Visit: Payer: PPO | Admitting: Oncology

## 2019-02-10 ENCOUNTER — Ambulatory Visit
Admission: RE | Admit: 2019-02-10 | Discharge: 2019-02-10 | Disposition: A | Discharge status: Home / Self Care | Payer: PPO | Source: Ambulatory Visit | Attending: Oncology | Admitting: Oncology

## 2019-02-10 ENCOUNTER — Other Ambulatory Visit: Payer: Self-pay

## 2019-02-10 ENCOUNTER — Encounter (HOSPITAL_BASED_OUTPATIENT_CLINIC_OR_DEPARTMENT_OTHER)
Admission: RE | Admit: 2019-02-10 | Discharge: 2019-02-10 | Disposition: A | Discharge status: Home / Self Care | Payer: PPO | Source: Ambulatory Visit | Attending: Oncology | Admitting: Oncology

## 2019-02-10 DIAGNOSIS — C7951 Secondary malignant neoplasm of bone: Secondary | ICD-10-CM | POA: Diagnosis not present

## 2019-02-10 DIAGNOSIS — C3490 Malignant neoplasm of unspecified part of unspecified bronchus or lung: Secondary | ICD-10-CM | POA: Diagnosis not present

## 2019-02-10 DIAGNOSIS — C3411 Malignant neoplasm of upper lobe, right bronchus or lung: Secondary | ICD-10-CM | POA: Diagnosis not present

## 2019-02-10 MED ORDER — IOHEXOL 300 MG/ML  SOLN
100.0000 mL | Freq: Once | INTRAMUSCULAR | Status: AC | PRN
  Administered 2019-02-10: 100 mL via INTRAVENOUS

## 2019-02-10 MED ORDER — TECHNETIUM TC 99M MEDRONATE IV KIT: 20.0000 | PACK | Freq: Once | INTRAVENOUS | Status: DC | PRN

## 2019-02-10 MED ORDER — TECHNETIUM TC 99M MEDRONATE IV KIT
22.0000 | PACK | Freq: Once | INTRAVENOUS | Status: AC | PRN
  Administered 2019-02-10: 09:00:00 24.184 via INTRAVENOUS

## 2019-02-11 ENCOUNTER — Other Ambulatory Visit: Payer: Self-pay

## 2019-02-11 ENCOUNTER — Encounter: Payer: Self-pay | Admitting: Oncology

## 2019-02-11 NOTE — Progress Notes (Signed)
Patient stated that she had been doing well with no complaints. Patient is anxious to know her CT Scans and Bone Scan results.

## 2019-02-12 ENCOUNTER — Inpatient Hospital Stay: Payer: PPO | Attending: Oncology

## 2019-02-12 ENCOUNTER — Other Ambulatory Visit: Payer: Self-pay

## 2019-02-12 ENCOUNTER — Inpatient Hospital Stay: Payer: PPO

## 2019-02-12 ENCOUNTER — Inpatient Hospital Stay (HOSPITAL_BASED_OUTPATIENT_CLINIC_OR_DEPARTMENT_OTHER): Payer: PPO | Admitting: Oncology

## 2019-02-12 VITALS — BP 145/90 | HR 73 | Temp 95.9°F | Ht 66.0 in | Wt 180.0 lb

## 2019-02-12 DIAGNOSIS — Z791 Long term (current) use of non-steroidal anti-inflammatories (NSAID): Secondary | ICD-10-CM | POA: Diagnosis not present

## 2019-02-12 DIAGNOSIS — R5383 Other fatigue: Secondary | ICD-10-CM | POA: Diagnosis not present

## 2019-02-12 DIAGNOSIS — E063 Autoimmune thyroiditis: Secondary | ICD-10-CM

## 2019-02-12 DIAGNOSIS — Z8249 Family history of ischemic heart disease and other diseases of the circulatory system: Secondary | ICD-10-CM | POA: Insufficient documentation

## 2019-02-12 DIAGNOSIS — Z79899 Other long term (current) drug therapy: Secondary | ICD-10-CM | POA: Diagnosis not present

## 2019-02-12 DIAGNOSIS — Z5112 Encounter for antineoplastic immunotherapy: Secondary | ICD-10-CM | POA: Insufficient documentation

## 2019-02-12 DIAGNOSIS — F329 Major depressive disorder, single episode, unspecified: Secondary | ICD-10-CM | POA: Insufficient documentation

## 2019-02-12 DIAGNOSIS — E785 Hyperlipidemia, unspecified: Secondary | ICD-10-CM | POA: Diagnosis not present

## 2019-02-12 DIAGNOSIS — C7951 Secondary malignant neoplasm of bone: Secondary | ICD-10-CM | POA: Insufficient documentation

## 2019-02-12 DIAGNOSIS — M549 Dorsalgia, unspecified: Secondary | ICD-10-CM | POA: Diagnosis not present

## 2019-02-12 DIAGNOSIS — I10 Essential (primary) hypertension: Secondary | ICD-10-CM | POA: Insufficient documentation

## 2019-02-12 DIAGNOSIS — K219 Gastro-esophageal reflux disease without esophagitis: Secondary | ICD-10-CM | POA: Diagnosis not present

## 2019-02-12 DIAGNOSIS — C3411 Malignant neoplasm of upper lobe, right bronchus or lung: Secondary | ICD-10-CM

## 2019-02-12 DIAGNOSIS — Z95828 Presence of other vascular implants and grafts: Secondary | ICD-10-CM

## 2019-02-12 DIAGNOSIS — J439 Emphysema, unspecified: Secondary | ICD-10-CM | POA: Diagnosis not present

## 2019-02-12 DIAGNOSIS — R590 Localized enlarged lymph nodes: Secondary | ICD-10-CM | POA: Diagnosis not present

## 2019-02-12 DIAGNOSIS — Z87891 Personal history of nicotine dependence: Secondary | ICD-10-CM | POA: Diagnosis not present

## 2019-02-12 LAB — CBC WITH DIFFERENTIAL/PLATELET
Abs Immature Granulocytes: 0.01 10*3/uL (ref 0.00–0.07)
Basophils Absolute: 0 10*3/uL (ref 0.0–0.1)
Basophils Relative: 1 %
Eosinophils Absolute: 0.2 10*3/uL (ref 0.0–0.5)
Eosinophils Relative: 4 %
HCT: 39.8 % (ref 36.0–46.0)
Hemoglobin: 12.2 g/dL (ref 12.0–15.0)
Immature Granulocytes: 0 %
Lymphocytes Relative: 20 %
Lymphs Abs: 0.8 10*3/uL (ref 0.7–4.0)
MCH: 29.5 pg (ref 26.0–34.0)
MCHC: 30.7 g/dL (ref 30.0–36.0)
MCV: 96.1 fL (ref 80.0–100.0)
Monocytes Absolute: 0.3 10*3/uL (ref 0.1–1.0)
Monocytes Relative: 7 %
Neutro Abs: 2.8 10*3/uL (ref 1.7–7.7)
Neutrophils Relative %: 68 %
Platelets: 137 10*3/uL — ABNORMAL LOW (ref 150–400)
RBC: 4.14 MIL/uL (ref 3.87–5.11)
RDW: 14.9 % (ref 11.5–15.5)
WBC: 4.2 10*3/uL (ref 4.0–10.5)
nRBC: 0 % (ref 0.0–0.2)

## 2019-02-12 LAB — COMPREHENSIVE METABOLIC PANEL
ALT: 26 U/L (ref 0–44)
AST: 35 U/L (ref 15–41)
Albumin: 4.3 g/dL (ref 3.5–5.0)
Alkaline Phosphatase: 112 U/L (ref 38–126)
Anion gap: 9 (ref 5–15)
BUN: 21 mg/dL (ref 8–23)
CO2: 29 mmol/L (ref 22–32)
Calcium: 9.3 mg/dL (ref 8.9–10.3)
Chloride: 102 mmol/L (ref 98–111)
Creatinine, Ser: 0.84 mg/dL (ref 0.44–1.00)
GFR calc Af Amer: >60 mL/min (ref >60)
GFR calc non Af Amer: >60 mL/min (ref >60)
Glucose, Bld: 87 mg/dL (ref 70–99)
Potassium: 4.5 mmol/L (ref 3.5–5.1)
Sodium: 140 mmol/L (ref 135–145)
Total Bilirubin: 0.5 mg/dL (ref 0.3–1.2)
Total Protein: 7.4 g/dL (ref 6.5–8.1)

## 2019-02-12 MED ORDER — SODIUM CHLORIDE 0.9% FLUSH
10.0000 mL | Freq: Once | INTRAVENOUS | Status: AC
  Administered 2019-02-12: 09:00:00 10 mL via INTRAVENOUS
  Filled 2019-02-12: qty 10

## 2019-02-12 MED ORDER — SODIUM CHLORIDE 0.9 % IV SOLN
1200.0000 mg | Freq: Once | INTRAVENOUS | Status: AC
  Administered 2019-02-12: 1200 mg via INTRAVENOUS
  Filled 2019-02-12: qty 20

## 2019-02-12 MED ORDER — SODIUM CHLORIDE 0.9 % IV SOLN
Freq: Once | INTRAVENOUS | Status: AC
  Filled 2019-02-12: qty 250

## 2019-02-12 MED ORDER — HEPARIN SOD (PORK) LOCK FLUSH 100 UNIT/ML IV SOLN
500.0000 [IU] | Freq: Once | INTRAVENOUS | Status: AC | PRN
  Administered 2019-02-12: 500 [IU]
  Filled 2019-02-12: qty 5

## 2019-02-12 MED ORDER — HEPARIN SOD (PORK) LOCK FLUSH 100 UNIT/ML IV SOLN
INTRAVENOUS | Status: AC
  Filled 2019-02-12: qty 5

## 2019-02-13 ENCOUNTER — Telehealth: Payer: Self-pay | Admitting: *Deleted

## 2019-02-13 NOTE — Telephone Encounter (Signed)
Called the daughter to talk to her about xgeva for her mom. Dr. Janese Banks talked to her about xgeva and she knows what happened that sent her to hospital . Because she had been getting calicum before she got so sick it could be chemo side effects  Or  xgeva or combination. Dr. Janese Banks feels that the benefits of xgeva outweighs the risk to keep her bones safe as can be.  Dr. Janese Banks felt that pt was not sure about it. She wanted me to call daughter. Crystal said that she would like her to start it and she will get her calcium and make her start taking it. I will arrange injections next week . Will send it on my chart per daughter request

## 2019-02-13 NOTE — Progress Notes (Signed)
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 She has been referred to [place] for findings of bone metastases and her recent MRI. She has a prior history of 3 packs/day day smoking for over 45 years and quit smoking 5 years ago.She had a CT chest abdomen pelvis in 2018 which showed a 5 mm lung nodule in the left lower lobe. Recently over the last 2 months patient has been having worsening back pain and was referred to orthopedics. 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 Pathologic fracture of S1.  PET scan showed 2 RUL lung nodules, hilar and mediastinal adenopathy and widespread bone mets. MRI brain negative.  Patientcompletedpalliative RT to her spine. Bronchoscopy showed small cell lung cancer. Palliative carboplatin, etoposide and Tecentriq started on 08/18/2018. Scans after 4 cycles showed stable disease. She is on maintenance tecentriq   Interval history- she feels well. Denies any pain. Has mild fatigue.   ECOG PS- 1 Pain scale- 0   Review of systems- Review of Systems  Constitutional: Negative for chills,  fever, malaise/fatigue and weight loss.  HENT: Negative for congestion, ear discharge and nosebleeds.   Eyes: Negative for blurred vision.  Respiratory: Negative for cough, hemoptysis, sputum production, shortness of breath and wheezing.   Cardiovascular: Negative for chest pain, palpitations, orthopnea and claudication.  Gastrointestinal: Negative for abdominal pain, blood in stool, constipation, diarrhea, heartburn, melena, nausea and vomiting.  Genitourinary: Negative for dysuria, flank pain, frequency, hematuria and urgency.  Musculoskeletal: Negative for back pain, joint pain and myalgias.  Skin: Negative for rash.  Neurological: Negative for dizziness, tingling, focal weakness, seizures, weakness and headaches.  Endo/Heme/Allergies: Does not bruise/bleed easily.  Psychiatric/Behavioral: Negative for depression and suicidal ideas. The patient does not have insomnia.       Allergies  Allergen Reactions  . Bee Venom Swelling     Past Medical History:  Diagnosis Date  . Arthritis   . Cirrhosis ([place])   . Depression   . GERD (gastroesophageal reflux disease)   . Hyperlipidemia   . Hypertension   . Lung cancer ([place])   . Metastatic bone cancer [facility])      Past Surgical History:  Procedure Laterality Date  . APPENDECTOMY  1971  . CHOLECYSTECTOMY  1971  . COLONOSCOPY WITH PROPOFOL N/A 11/15/2016   Procedure: COLONOSCOPY WITH PROPOFOL;  Surgeon: Jonathon Bellows, MD;  Location: [facility] ENDOSCOPY;  Service: Gastroenterology;  Laterality: N/A;  . ENDOBRONCHIAL ULTRASOUND Right 07/30/2018   Procedure: ENDOBRONCHIAL ULTRASOUND;  Surgeon: Laverle Hobby, MD;  Location: [facility] ORS;  Service: Pulmonary;  Laterality: Right;  . ESOPHAGOGASTRODUODENOSCOPY (EGD) WITH PROPOFOL N/A 01/07/2017   Procedure: ESOPHAGOGASTRODUODENOSCOPY (EGD) WITH PROPOFOL;  Surgeon: Jonathon Bellows, MD;  Location: [facility] ENDOSCOPY;  Service:  Gastroenterology;  Laterality: N/A;  . LAPAROSCOPY N/A 03/01/2017   Procedure:  LAPAROSCOPY DIAGNOSTIC;  Surgeon: Robert Bellow, MD;  Location: [facility] ORS;  Service: General;  Laterality: N/A;  . PORTA CATH INSERTION N/A 08/14/2018   Procedure: PORTA CATH INSERTION;  Surgeon: Algernon Huxley, MD;  Location: [facility];  Service: Cardiovascular;  Laterality: N/A;  . TONSILECTOMY, ADENOIDECTOMY, BILATERAL MYRINGOTOMY [place]  . TONSILLECTOMY    . VENTRAL HERNIA REPAIR N/A 03/01/2017   10 x 14 CM Ventralight ST mesh, intraperitoneal location.   . VENTRAL HERNIA REPAIR N/A 03/01/2017   Procedure: HERNIA REPAIR VENTRAL ADULT;  Surgeon: Robert Bellow, MD;  Location: [facility] ORS;  Service: General;  Laterality: N/A;    Social History   Socioeconomic History  . Marital status: Married    Spouse name: [name]  . Number of children: Not on file  . Years of education: Not on file  . Highest education level: Not on file  Occupational History  . Not on file  Tobacco Use  . Smoking status: Former Smoker    Packs/day: 2.00    Years: 50.00    Pack years: 100.00    Types: Cigarettes, E-cigarettes    Quit date: 02/07/2012    Years since quitting: 7.0  . Smokeless tobacco: Never Used  Substance and Sexual Activity  . Alcohol use: No    Alcohol/week: 0.0 standard drinks  . Drug use: No  . Sexual activity: Yes  Other Topics Concern  . Not on file  Social History Narrative   Married   Retired   Clinical cytogeneticist level of education    No children    1 cup of coffee   Social Determinants of Radio broadcast assistant Strain:   . Difficulty of Paying Living Expenses: Not on file  Food Insecurity:   . Worried About Charity fundraiser in the Last Year: Not on file  . Ran Out of Food in the Last Year: Not on file  Transportation Needs:   . Lack of Transportation (Medical): Not on file  . Lack of Transportation (Non-Medical): Not on file  Physical Activity: Unknown  . Days of Exercise per Week: Patient refused  . Minutes of Exercise per Session:  Patient refused  Stress:   . Feeling of Stress : Not on file  Social Connections: Unknown  . Frequency of Communication with Friends and Family: Patient refused  . Frequency of Social Gatherings with Friends and Family: Patient refused  . Attends Religious Services: Patient refused  . Active Member of Clubs or Organizations: Patient refused  . Attends Archivist Meetings: Patient refused  . Marital Status: Patient refused  Intimate Partner Violence:   . Fear of Current or Ex-Partner: Not on file  . Emotionally Abused: Not on file  . Physically Abused: Not on file  . Sexually Abused: Not on file    Family History  Problem Relation Age of Onset  . Hypertension Mother   . Ovarian cancer Mother 50  . Heart disease Father   . Stroke Father   . Ovarian cancer Sister        ? dx cancer had hyst.  . Breast cancer Neg Hx      Current Outpatient Medications:  .  escitalopram (LEXAPRO) 10 MG tablet, Take 1 tablet (10 mg total) by mouth daily., Disp: 90 tablet, Rfl: 1 .  feeding supplement, ENSURE ENLIVE, (ENSURE ENLIVE) LIQD, Take 237 mLs by mouth 2 (  two) times daily between meals., Disp: 237 mL, Rfl: 12 .  folic acid (FOLVITE) 1 MG tablet, Take 1 tablet (1 mg total) by mouth daily., Disp: 30 tablet, Rfl: 3 .  ibuprofen (ADVIL,MOTRIN) 200 MG tablet, Take 400 mg by mouth every 8 (eight) hours as needed for headache or moderate pain. , Disp: , Rfl:  .  levothyroxine (SYNTHROID) 50 MCG tablet, Take 1 tablet (50 mcg total) by mouth daily before breakfast., Disp: 30 tablet, Rfl: 1 .  lidocaine-prilocaine (EMLA) cream, Apply to affected area once, Disp: 30 g, Rfl: 3 .  loperamide (IMODIUM) 2 MG capsule, Take 1 capsule (2 mg total) by mouth every 2 (two) hours as needed for diarrhea or loose stools. Do not exceed 16mg  daily., Disp: 30 capsule, Rfl: 1 .  Multiple Vitamins-Minerals (PRESERVISION AREDS 2) CAPS, Take 1 capsule by mouth 2 (two) times a day. , Disp: , Rfl:  .  rosuvastatin  (CRESTOR) 20 MG tablet, Take 1 tablet by mouth once daily, Disp: 90 tablet, Rfl: 0 .  ondansetron (ZOFRAN) 8 MG tablet, Take 1 tablet (8 mg total) by mouth 2 (two) times daily as needed for refractory nausea / vomiting. Start on day 3 after carboplatin chemo. (Patient not taking: Reported on 01/16/2019), Disp: 30 tablet, Rfl: 1 .  polyethylene glycol (MIRALAX / GLYCOLAX) 17 g packet, Take 17 g by mouth daily as needed for mild constipation. (Patient not taking: Reported on 02/11/2019), Disp: 14 each, Rfl: 0 .  prochlorperazine (COMPAZINE) 10 MG tablet, Take 1 tablet (10 mg total) by mouth every 6 (six) hours as needed (Nausea or vomiting). (Patient not taking: Reported on 12/05/2018), Disp: 30 tablet, Rfl: 1 .  silver sulfADIAZINE (SILVADENE) 1 % cream, Apply topically 2 (two) times daily. (Patient not taking: Reported on 01/16/2019), Disp: 50 g, Rfl: 0  Physical exam:  Vitals:   02/12/19 0916  BP: (!) 145/90  Pulse: 73  Temp: (!) 95.9 F (35.5 C)  TempSrc: Tympanic  Weight: 180 lb (81.6 kg)  Height: 5\' 6"  (1.676 m)   Physical Exam Constitutional:      General: She is not in acute distress. HENT:     Head: Normocephalic and atraumatic.  Eyes:     Pupils: Pupils are equal, round, and reactive to light.  Cardiovascular:     Rate and Rhythm: Normal rate and regular rhythm.     Heart sounds: Normal heart sounds.  Pulmonary:     Effort: Pulmonary effort is normal.     Breath sounds: Normal breath sounds.  Abdominal:     General: Bowel sounds are normal.     Palpations: Abdomen is soft.  Musculoskeletal:     Cervical back: Normal range of motion.  Skin:    General: Skin is warm and dry.  Neurological:     Mental Status: She is alert and oriented to person, place, and time.      CMP Latest Ref Rng & Units 02/12/2019  Glucose 70 - 99 mg/dL 87  BUN 8 - 23 mg/dL 21  Creatinine 0.44 - 1.00 mg/dL 0.84  Sodium 135 - 145 mmol/L 140  Potassium 3.5 - 5.1 mmol/L 4.5  Chloride 98 - 111  mmol/L 102  CO2 22 - 32 mmol/L 29  Calcium 8.9 - 10.3 mg/dL 9.3  Total Protein 6.5 - 8.1 g/dL 7.4  Total Bilirubin 0.3 - 1.2 mg/dL 0.5  Alkaline Phos 38 - 126 U/L 112  AST 15 - 41 U/L 35  ALT 0 - 44  U/L 26   CBC Latest Ref Rng & Units 02/12/2019  WBC 4.0 - 10.5 K/uL 4.2  Hemoglobin 12.0 - 15.0 g/dL 12.2  Hematocrit 36.0 - 46.0 % 39.8  Platelets 150 - 400 K/uL 137(L)    No images are attached to the encounter.  CT Chest W Contrast  Result Date: 02/10/2019 CLINICAL DATA:  Small-cell lung cancer with osseous metastases. EXAM: CT CHEST, ABDOMEN, AND PELVIS WITH CONTRAST TECHNIQUE: Multidetector CT imaging of the chest, abdomen and pelvis was performed following the standard protocol during bolus administration of intravenous contrast. CONTRAST:  155mL OMNIPAQUE IOHEXOL 300 MG/ML  SOLN COMPARISON:  11/10/2018 FINDINGS: CT CHEST FINDINGS Cardiovascular: The heart size is normal. No substantial pericardial effusion. Coronary artery calcification is evident. Atherosclerotic calcification is noted in the wall of the thoracic aorta. Right Port-A-Cath tip is positioned in the distal SVC. Mediastinum/Nodes: No mediastinal lymphadenopathy. There is no hilar lymphadenopathy. The esophagus has normal imaging features. There is no axillary lymphadenopathy. Lungs/Pleura: Centrilobular emphsyema noted. 4-5 mm paraspinal right upper lobe pulmonary nodule is stable in the interval. No focal airspace consolidation. No pleural effusion. Musculoskeletal: Similar appearance of the relatively diffuse sclerotic bone metastases. Nonacute posterior left rib fracture again noted. Stable compression deformity at T12. CT ABDOMEN PELVIS FINDINGS Hepatobiliary: Liver morphology compatible with cirrhosis. No focal abnormality within the liver parenchyma. Gallbladder is surgically absent. No intrahepatic or extrahepatic biliary dilation. Pancreas: No focal mass lesion. No dilatation of the main duct. No intraparenchymal cyst. No  peripancreatic edema. Spleen: No splenomegaly. No focal mass lesion. Adrenals/Urinary Tract: No adrenal nodule or mass. Tiny hypodense cortical lesions in both kidneys are too small to characterize but similar to prior. No evidence for hydroureter. Bladder is decompressed. Stomach/Bowel: Stomach is unremarkable. No gastric wall thickening. No evidence of outlet obstruction. Duodenum is normally positioned as is the ligament of Treitz. Prominent duodenal diverticulum noted. No small bowel wall thickening. No small bowel dilatation. The terminal ileum is normal. The appendix is not visualized, but there is no edema or inflammation in the region of the cecum. No gross colonic mass. No colonic wall thickening. Vascular/Lymphatic: There is abdominal aortic atherosclerosis without aneurysm. The uterus is unremarkable. There is no adnexal mass. There is no gastrohepatic or hepatoduodenal ligament lymphadenopathy. No retroperitoneal or mesenteric lymphadenopathy. No pelvic sidewall lymphadenopathy. Reproductive: The uterus is unremarkable.  There is no adnexal mass. Other: No intraperitoneal free fluid. Musculoskeletal: Diffuse sclerotic bone metastases again noted. Stable appearance L2 compression fracture and L1 superior endplate compression deformity. IMPRESSION: 1. Stable exam.  No new or progressive interval findings. 2. Right upper lobe pulmonary nodule is stable. 3. Similar appearance of the diffuse bony metastatic involvement. 4. Cirrhotic liver morphology. 5.  Aortic Atherosclerois (ICD10-170.0) 6.  Emphysema. ([id number]) Electronically Signed   By: Misty Stanley M.D.   On: 02/10/2019 09:21   NM Bone Scan Whole Body  Result Date: 02/11/2019 CLINICAL DATA:  69 year old female with small cell lung cancer, osseous metastases. Restaging. EXAM: NUCLEAR MEDICINE WHOLE BODY BONE SCAN TECHNIQUE: Whole body anterior and posterior images were obtained approximately 3 hours after intravenous injection of  radiopharmaceutical. RADIOPHARMACEUTICALS:  24.2 mCi Technetium-82m MDP IV COMPARISON:  CT Chest, Abdomen, and Pelvis from the same day reported separately. Whole-body bone scan 11/10/2018. FINDINGS: Radiotracer activity visible in both kidneys and diminutive urinary bladder. Interval increased heterogeneity of radiotracer activity throughout the thoracolumbar spine, correlating with mixed lytic and sclerotic osseous metastases on the contemporary CT. Stable heterogeneous radiotracer activity in the  pelvis. Stable scattered abnormal foci in the ribs. Stable heterogeneous radiotracer activity in the bilateral humeri and femurs. Stable appearance of the skull, no definite abnormal radiotracer activity in the skull or cervical spine. IMPRESSION: 1. Increased heterogeneous radiotracer activity throughout the thoracolumbar spine compared to October. 2. Otherwise stable scintigraphic appearance of widely scattered osseous metastatic disease. Electronically Signed   By: Genevie Ann M.D.   On: 02/11/2019 07:03   CT ABDOMEN PELVIS W CONTRAST  Result Date: 02/10/2019 CLINICAL DATA:  Small-cell lung cancer with osseous metastases. EXAM: CT CHEST, ABDOMEN, AND PELVIS WITH CONTRAST TECHNIQUE: Multidetector CT imaging of the chest, abdomen and pelvis was performed following the standard protocol during bolus administration of intravenous contrast. CONTRAST:  177mL OMNIPAQUE IOHEXOL 300 MG/ML  SOLN COMPARISON:  11/10/2018 FINDINGS: CT CHEST FINDINGS Cardiovascular: The heart size is normal. No substantial pericardial effusion. Coronary artery calcification is evident. Atherosclerotic calcification is noted in the wall of the thoracic aorta. Right Port-A-Cath tip is positioned in the distal SVC. Mediastinum/Nodes: No mediastinal lymphadenopathy. There is no hilar lymphadenopathy. The esophagus has normal imaging features. There is no axillary lymphadenopathy. Lungs/Pleura: Centrilobular emphsyema noted. 4-5 mm paraspinal right upper  lobe pulmonary nodule is stable in the interval. No focal airspace consolidation. No pleural effusion. Musculoskeletal: Similar appearance of the relatively diffuse sclerotic bone metastases. Nonacute posterior left rib fracture again noted. Stable compression deformity at T12. CT ABDOMEN PELVIS FINDINGS Hepatobiliary: Liver morphology compatible with cirrhosis. No focal abnormality within the liver parenchyma. Gallbladder is surgically absent. No intrahepatic or extrahepatic biliary dilation. Pancreas: No focal mass lesion. No dilatation of the main duct. No intraparenchymal cyst. No peripancreatic edema. Spleen: No splenomegaly. No focal mass lesion. Adrenals/Urinary Tract: No adrenal nodule or mass. Tiny hypodense cortical lesions in both kidneys are too small to characterize but similar to prior. No evidence for hydroureter. Bladder is decompressed. Stomach/Bowel: Stomach is unremarkable. No gastric wall thickening. No evidence of outlet obstruction. Duodenum is normally positioned as is the ligament of Treitz. Prominent duodenal diverticulum noted. No small bowel wall thickening. No small bowel dilatation. The terminal ileum is normal. The appendix is not visualized, but there is no edema or inflammation in the region of the cecum. No gross colonic mass. No colonic wall thickening. Vascular/Lymphatic: There is abdominal aortic atherosclerosis without aneurysm. The uterus is unremarkable. There is no adnexal mass. There is no gastrohepatic or hepatoduodenal ligament lymphadenopathy. No retroperitoneal or mesenteric lymphadenopathy. No pelvic sidewall lymphadenopathy. Reproductive: The uterus is unremarkable.  There is no adnexal mass. Other: No intraperitoneal free fluid. Musculoskeletal: Diffuse sclerotic bone metastases again noted. Stable appearance L2 compression fracture and L1 superior endplate compression deformity. IMPRESSION: 1. Stable exam.  No new or progressive interval findings. 2. Right upper lobe  pulmonary nodule is stable. 3. Similar appearance of the diffuse bony metastatic involvement. 4. Cirrhotic liver morphology. 5.  Aortic Atherosclerois (ICD10-170.0) 6.  Emphysema. ([id number]) Electronically Signed   By: Misty Stanley M.D.   On: 02/10/2019 09:21     Assessment and plan- Patient is a 69 y.o. female with extensive stage small cell lung cancer and bone metastases.   He is here for on treatment assessment prior to cycle 5 of maintenance Tecentriq  I have personally reviewed CT chest abdomen and pelvis images independently and discussed findings with the patient. Bone scan shows increased radio tracer activity throughout the thoracolumbar spine however no new bone lesions are noted.  CT abdomen and pelvis also did not show any findings of new  or progressive disease.  Patient will continue maintenance Tecentriq at this time and counts are otherwise okay to proceed with cycle 5 today.  I personally reviewed labs from today as well.  Patient did have Tecentriq induced hypothyroidism and currently on 50 mcg of levothyroxine.  I will repeat TSH free T3 and free T4 in 3 weeks time.  She will likely need further titration of her levothyroxine dose.  Bone mets: Patient received 1 dose of Xgeva following which she had significant hypocalcemia and since then Delton See has been.  Her calcium levels have improved to 9.3 and I will restart Xgeva at her next visit.     Visit Diagnosis 1. Encounter for antineoplastic immunotherapy   2. Autoimmune hypothyroidism   3. Small cell lung cancer, right upper lobe ([place])      Dr. Randa Evens, MD, MPH [facility] at [facility] [phone] 02/13/2019 7:19 PM

## 2019-02-16 ENCOUNTER — Ambulatory Visit (INDEPENDENT_AMBULATORY_CARE_PROVIDER_SITE_OTHER): Payer: PPO | Admitting: Family Medicine

## 2019-02-16 ENCOUNTER — Other Ambulatory Visit: Payer: Self-pay

## 2019-02-16 ENCOUNTER — Encounter: Payer: Self-pay | Admitting: Family Medicine

## 2019-02-16 DIAGNOSIS — F331 Major depressive disorder, recurrent, moderate: Secondary | ICD-10-CM | POA: Diagnosis not present

## 2019-02-16 DIAGNOSIS — C3411 Malignant neoplasm of upper lobe, right bronchus or lung: Secondary | ICD-10-CM | POA: Diagnosis not present

## 2019-02-16 DIAGNOSIS — E785 Hyperlipidemia, unspecified: Secondary | ICD-10-CM | POA: Diagnosis not present

## 2019-02-16 DIAGNOSIS — E032 Hypothyroidism due to medicaments and other exogenous substances: Secondary | ICD-10-CM | POA: Diagnosis not present

## 2019-02-16 DIAGNOSIS — E039 Hypothyroidism, unspecified: Secondary | ICD-10-CM | POA: Insufficient documentation

## 2019-02-16 MED ORDER — ESCITALOPRAM OXALATE 10 MG PO TABS: 10.0000 mg | ORAL_TABLET | Freq: Every day | ORAL | 1 refills | Status: DC

## 2019-02-16 MED ORDER — LEVOTHYROXINE SODIUM 50 MCG PO TABS: 50.0000 ug | ORAL_TABLET | Freq: Every day | ORAL | 1 refills | Status: DC

## 2019-02-16 NOTE — Assessment & Plan Note (Signed)
Likely related to medication.  I sent her Synthroid in.  She will have lab work rechecked through oncology.

## 2019-02-16 NOTE — Progress Notes (Signed)
Virtual Visit via telephone Note  This visit type was conducted due to national recommendations for restrictions regarding the COVID-19 pandemic (e.g. social distancing).  This format is felt to be most appropriate for this patient at this time.  All issues noted in this document were discussed and addressed.  No physical exam was performed (except for noted visual exam findings with Video Visits).   I connected with Anne [name] today at  3:30 PM EST by telephone and verified that I am speaking with the correct person using two identifiers. Location patient: home Location provider: work Persons participating in the virtual visit: patient, provider  I discussed the limitations, risks, security and privacy concerns of performing an evaluation and management service by telephone and the availability of in person appointments. I also discussed with the patient that there may be a patient responsible charge related to this service. The patient expressed understanding and agreed to proceed.  Interactive audio and video telecommunications were attempted between this provider and patient, however failed, due to patient having technical difficulties OR patient did not have access to video capability.  We continued and completed visit with audio only.   Reason for visit: follow-up  HPI: Small cell lung cancer: Patient reports she is doing well with this.  She reports this is dormant currently and she is on a preventative medication to help with this.  She is following with oncology.  Doing well overall.  Hypothyroidism: Diagnosed through oncology.  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  They had planned to start her on 50 mcg of levothyroxine and repeat her TSH in 6 weeks though the patient notes she has not started on the levothyroxine.  She does note very dry skin.  No heat or cold intolerance.  Hyperlipidemia: Taking Crestor.  No  chest pain, right upper quadrant pain, or myalgias.  Depression: Patient notes some days are better than others.  She is not taking Lexapro currently.  No SI.   ROS: See pertinent positives and negatives per HPI.  Past Medical History:  Diagnosis Date  . Arthritis   . Cirrhosis ([place])   . Depression   . GERD (gastroesophageal reflux disease)   . Hyperlipidemia   . Hypertension   . Lung cancer ([name])   . Metastatic bone cancer [facility])     Past Surgical History:  Procedure Laterality Date  . APPENDECTOMY  1971  . CHOLECYSTECTOMY  1971  . COLONOSCOPY WITH PROPOFOL N/A 11/15/2016   Procedure: COLONOSCOPY WITH PROPOFOL;  Surgeon: Jonathon Bellows, MD;  Location: [facility] ENDOSCOPY;  Service: Gastroenterology;  Laterality: N/A;  . ENDOBRONCHIAL ULTRASOUND Right 07/30/2018   Procedure: ENDOBRONCHIAL ULTRASOUND;  Surgeon: Laverle Hobby, MD;  Location: [facility] ORS;  Service: Pulmonary;  Laterality: Right;  . ESOPHAGOGASTRODUODENOSCOPY (EGD) WITH PROPOFOL N/A 01/07/2017   Procedure: ESOPHAGOGASTRODUODENOSCOPY (EGD) WITH PROPOFOL;  Surgeon: Jonathon Bellows, MD;  Location: [facility] ENDOSCOPY;  Service: Gastroenterology;  Laterality: N/A;  . LAPAROSCOPY N/A 03/01/2017   Procedure: LAPAROSCOPY DIAGNOSTIC;  Surgeon: Robert Bellow, MD;  Location: [facility] ORS;  Service: General;  Laterality: N/A;  . PORTA CATH INSERTION N/A 08/14/2018   Procedure: PORTA CATH INSERTION;  Surgeon: Algernon Huxley, MD;  Location: [place] CV LAB;  Service: Cardiovascular;  Laterality: N/A;  . TONSILECTOMY, ADENOIDECTOMY, BILATERAL MYRINGOTOMY [place]  . TONSILLECTOMY    . VENTRAL HERNIA REPAIR N/A 03/01/2017   10 x 14 CM Ventralight ST mesh, intraperitoneal location.   Marland Kitchen  VENTRAL HERNIA REPAIR N/A 03/01/2017   Procedure: HERNIA REPAIR VENTRAL ADULT;  Surgeon: Robert Bellow, MD;  Location: [facility] ORS;  Service: General;  Laterality: N/A;    Family History  Problem Relation Age of Onset  . Hypertension Mother   .  Ovarian cancer Mother 63  . Heart disease Father   . Stroke Father   . Ovarian cancer Sister        ? dx cancer had hyst.  . Breast cancer Neg Hx     SOCIAL HX: Former smoker   Current Outpatient Medications:  .  escitalopram (LEXAPRO) 10 MG tablet, Take 1 tablet (10 mg total) by mouth daily., Disp: 90 tablet, Rfl: 1 .  feeding supplement, ENSURE ENLIVE, (ENSURE ENLIVE) LIQD, Take 237 mLs by mouth 2 (two) times daily between meals., Disp: 237 mL, Rfl: 12 .  folic acid (FOLVITE) 1 MG tablet, Take 1 tablet (1 mg total) by mouth daily., Disp: 30 tablet, Rfl: 3 .  ibuprofen (ADVIL,MOTRIN) 200 MG tablet, Take 400 mg by mouth every 8 (eight) hours as needed for headache or moderate pain. , Disp: , Rfl:  .  levothyroxine (SYNTHROID) 50 MCG tablet, Take 1 tablet (50 mcg total) by mouth daily before breakfast., Disp: 30 tablet, Rfl: 1 .  lidocaine-prilocaine (EMLA) cream, Apply to affected area once, Disp: 30 g, Rfl: 3 .  loperamide (IMODIUM) 2 MG capsule, Take 1 capsule (2 mg total) by mouth every 2 (two) hours as needed for diarrhea or loose stools. Do not exceed 16mg  daily., Disp: 30 capsule, Rfl: 1 .  Multiple Vitamins-Minerals (PRESERVISION AREDS 2) CAPS, Take 1 capsule by mouth 2 (two) times a day. , Disp: , Rfl:  .  ondansetron (ZOFRAN) 8 MG tablet, Take 1 tablet (8 mg total) by mouth 2 (two) times daily as needed for refractory nausea / vomiting. Start on day 3 after carboplatin chemo., Disp: 30 tablet, Rfl: 1 .  polyethylene glycol (MIRALAX / GLYCOLAX) 17 g packet, Take 17 g by mouth daily as needed for mild constipation., Disp: 14 each, Rfl: 0 .  prochlorperazine (COMPAZINE) 10 MG tablet, Take 1 tablet (10 mg total) by mouth every 6 (six) hours as needed (Nausea or vomiting)., Disp: 30 tablet, Rfl: 1 .  rosuvastatin (CRESTOR) 20 MG tablet, Take 1 tablet by mouth once daily, Disp: 90 tablet, Rfl: 0 .  silver sulfADIAZINE (SILVADENE) 1 % cream, Apply topically 2 (two) times daily., Disp: 50 g,  Rfl: 0  EXAM: This is a telehealth telephone visit and thus no physical exam was completed.  ASSESSMENT AND PLAN:  Discussed the following assessment and plan:  No problem-specific Assessment & Plan notes found for this encounter.   No orders of the defined types were placed in this encounter.   No orders of the defined types were placed in this encounter.    I discussed the assessment and treatment plan with the patient. The patient was provided an opportunity to ask questions and all were answered. The patient agreed with the plan and demonstrated an understanding of the instructions.   The patient was advised to call back or seek an in-person evaluation if the symptoms worsen or if the condition fails to improve as anticipated.  I provided 14 minutes of non-face-to-face time during this encounter.   Tommi Rumps, MD

## 2019-02-16 NOTE — Assessment & Plan Note (Signed)
Intermittent symptoms.  We will restart Lexapro.

## 2019-02-16 NOTE — Assessment & Plan Note (Signed)
-  Continue Lipitor °

## 2019-02-16 NOTE — Assessment & Plan Note (Signed)
Overall physically seems to be doing well.  She will continue to see oncology.

## 2019-02-23 ENCOUNTER — Other Ambulatory Visit: Payer: Self-pay | Admitting: Family Medicine

## 2019-02-23 DIAGNOSIS — F331 Major depressive disorder, recurrent, moderate: Secondary | ICD-10-CM

## 2019-02-25 ENCOUNTER — Other Ambulatory Visit: Payer: Self-pay | Admitting: Family Medicine

## 2019-03-04 ENCOUNTER — Other Ambulatory Visit: Payer: Self-pay

## 2019-03-04 ENCOUNTER — Encounter: Payer: Self-pay | Admitting: Oncology

## 2019-03-04 NOTE — Progress Notes (Signed)
Patient stated that she always has back pain but nothing new. Patient stated that she does not get SOB.

## 2019-03-05 ENCOUNTER — Inpatient Hospital Stay (HOSPITAL_BASED_OUTPATIENT_CLINIC_OR_DEPARTMENT_OTHER): Payer: PPO | Admitting: Oncology

## 2019-03-05 ENCOUNTER — Other Ambulatory Visit: Payer: Self-pay

## 2019-03-05 ENCOUNTER — Inpatient Hospital Stay: Payer: PPO

## 2019-03-05 VITALS — BP 146/82 | HR 76 | Temp 96.5°F | Ht 66.0 in | Wt 181.0 lb

## 2019-03-05 DIAGNOSIS — Z5112 Encounter for antineoplastic immunotherapy: Secondary | ICD-10-CM

## 2019-03-05 DIAGNOSIS — C3411 Malignant neoplasm of upper lobe, right bronchus or lung: Secondary | ICD-10-CM | POA: Diagnosis not present

## 2019-03-05 DIAGNOSIS — Z95828 Presence of other vascular implants and grafts: Secondary | ICD-10-CM

## 2019-03-05 DIAGNOSIS — E063 Autoimmune thyroiditis: Secondary | ICD-10-CM

## 2019-03-05 DIAGNOSIS — C7951 Secondary malignant neoplasm of bone: Secondary | ICD-10-CM

## 2019-03-05 LAB — CBC WITH DIFFERENTIAL/PLATELET
Abs Immature Granulocytes: 0.01 10*3/uL (ref 0.00–0.07)
Basophils Absolute: 0.1 10*3/uL (ref 0.0–0.1)
Basophils Relative: 2 %
Eosinophils Absolute: 0.2 10*3/uL (ref 0.0–0.5)
Eosinophils Relative: 4 %
HCT: 38.9 % (ref 36.0–46.0)
Hemoglobin: 11.9 g/dL — ABNORMAL LOW (ref 12.0–15.0)
Immature Granulocytes: 0 %
Lymphocytes Relative: 20 %
Lymphs Abs: 0.8 10*3/uL (ref 0.7–4.0)
MCH: 29.2 pg (ref 26.0–34.0)
MCHC: 30.6 g/dL (ref 30.0–36.0)
MCV: 95.3 fL (ref 80.0–100.0)
Monocytes Absolute: 0.3 10*3/uL (ref 0.1–1.0)
Monocytes Relative: 7 %
Neutro Abs: 2.8 10*3/uL (ref 1.7–7.7)
Neutrophils Relative %: 67 %
Platelets: 125 10*3/uL — ABNORMAL LOW (ref 150–400)
RBC: 4.08 MIL/uL (ref 3.87–5.11)
RDW: 15.4 % (ref 11.5–15.5)
WBC: 4.1 10*3/uL (ref 4.0–10.5)
nRBC: 0 % (ref 0.0–0.2)

## 2019-03-05 LAB — COMPREHENSIVE METABOLIC PANEL
ALT: 27 U/L (ref 0–44)
AST: 36 U/L (ref 15–41)
Albumin: 4.3 g/dL (ref 3.5–5.0)
Alkaline Phosphatase: 93 U/L (ref 38–126)
Anion gap: 7 (ref 5–15)
BUN: 27 mg/dL — ABNORMAL HIGH (ref 8–23)
CO2: 29 mmol/L (ref 22–32)
Calcium: 9 mg/dL (ref 8.9–10.3)
Chloride: 102 mmol/L (ref 98–111)
Creatinine, Ser: 0.99 mg/dL (ref 0.44–1.00)
GFR calc Af Amer: >60 mL/min (ref >60)
GFR calc non Af Amer: 58 mL/min — ABNORMAL LOW (ref >60)
Glucose, Bld: 86 mg/dL (ref 70–99)
Potassium: 3.9 mmol/L (ref 3.5–5.1)
Sodium: 138 mmol/L (ref 135–145)
Total Bilirubin: 0.6 mg/dL (ref 0.3–1.2)
Total Protein: 7.5 g/dL (ref 6.5–8.1)

## 2019-03-05 LAB — TSH: TSH: 55 u[IU]/mL — ABNORMAL HIGH (ref 0.350–4.500)

## 2019-03-05 MED ORDER — HEPARIN SOD (PORK) LOCK FLUSH 100 UNIT/ML IV SOLN
500.0000 [IU] | Freq: Once | INTRAVENOUS | Status: AC | PRN
  Administered 2019-03-05: 500 [IU]
  Filled 2019-03-05: qty 5

## 2019-03-05 MED ORDER — SODIUM CHLORIDE 0.9% FLUSH
10.0000 mL | Freq: Once | INTRAVENOUS | Status: AC
  Administered 2019-03-05: 10 mL via INTRAVENOUS
  Filled 2019-03-05: qty 10

## 2019-03-05 MED ORDER — SODIUM CHLORIDE 0.9 % IV SOLN
1200.0000 mg | Freq: Once | INTRAVENOUS | Status: AC
  Administered 2019-03-05: 15:00:00 1200 mg via INTRAVENOUS
  Filled 2019-03-05: qty 20

## 2019-03-05 MED ORDER — SODIUM CHLORIDE 0.9 % IV SOLN
Freq: Once | INTRAVENOUS | Status: AC
  Filled 2019-03-05: qty 250

## 2019-03-05 MED ORDER — DENOSUMAB 120 MG/1.7ML ~~LOC~~ SOLN
120.0000 mg | SUBCUTANEOUS | Status: AC
  Administered 2019-03-05: 15:00:00 120 mg via SUBCUTANEOUS
  Filled 2019-03-05: qty 1.7

## 2019-03-06 MED ORDER — LEVOTHYROXINE SODIUM 100 MCG PO TABS: 100.0000 ug | ORAL_TABLET | Freq: Every day | ORAL | 3 refills | Status: DC

## 2019-03-06 NOTE — Progress Notes (Signed)
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 She has been referred to [place] for findings of bone metastases and her recent MRI. She has a prior history of 3 packs/day day smoking for over 45 years and quit smoking 5 years ago.She had a CT chest abdomen pelvis in 2018 which showed a 5 mm lung nodule in the left lower lobe. Recently over the last 2 months patient has been having worsening back pain and was referred to orthopedics. 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 Pathologic fracture of S1.  PET scan showed 2 RUL lung nodules, hilar and mediastinal adenopathy and widespread bone mets. MRI brain negative.  Patientcompletedpalliative RT to her spine. Bronchoscopy showed small cell lung cancer. Palliative carboplatin, etoposide and Tecentriq started on 08/18/2018. Scans after 4 cycles showed stable disease. She is on maintenance tecentriq    Interval history-reports ongoing weight gain but denies other complaints  ECOG PS- 1 Pain scale- 0  Review of systems- Review of Systems  Constitutional: Positive for  malaise/fatigue. Negative for chills, fever and weight loss.       Weight gain  HENT: Negative for congestion, ear discharge and nosebleeds.   Eyes: Negative for blurred vision.  Respiratory: Negative for cough, hemoptysis, sputum production, shortness of breath and wheezing.   Cardiovascular: Negative for chest pain, palpitations, orthopnea and claudication.  Gastrointestinal: Negative for abdominal pain, blood in stool, constipation, diarrhea, heartburn, melena, nausea and vomiting.  Genitourinary: Negative for dysuria, flank pain, frequency, hematuria and urgency.  Musculoskeletal: Negative for back pain, joint pain and myalgias.  Skin: Negative for rash.  Neurological: Negative for dizziness, tingling, focal weakness, seizures, weakness and headaches.  Endo/Heme/Allergies: Does not bruise/bleed easily.  Psychiatric/Behavioral: Negative for depression and suicidal ideas. The patient does not have insomnia.      Allergies  Allergen Reactions  . Bee Venom Swelling     Past Medical History:  Diagnosis Date  . Arthritis   . Cirrhosis ([place])   . Depression   . GERD (gastroesophageal reflux disease)   . Hyperlipidemia   . Hypertension   . Lung cancer ([place])   . Metastatic bone cancer [facility])      Past Surgical History:  Procedure Laterality Date  . APPENDECTOMY  1971  . CHOLECYSTECTOMY  1971  . COLONOSCOPY WITH PROPOFOL N/A 11/15/2016   Procedure: COLONOSCOPY WITH PROPOFOL;  Surgeon: Jonathon Bellows, MD;  Location: [facility] ENDOSCOPY;  Service: Gastroenterology;  Laterality: N/A;  . ENDOBRONCHIAL ULTRASOUND Right 07/30/2018   Procedure: ENDOBRONCHIAL ULTRASOUND;  Surgeon: Laverle Hobby, MD;  Location: [facility] ORS;  Service: Pulmonary;  Laterality: Right;  . ESOPHAGOGASTRODUODENOSCOPY (EGD) WITH PROPOFOL N/A 01/07/2017   Procedure: ESOPHAGOGASTRODUODENOSCOPY (EGD) WITH PROPOFOL;  Surgeon: Jonathon Bellows, MD;  Location: [facility] ENDOSCOPY;  Service: Gastroenterology;  Laterality: N/A;  .  LAPAROSCOPY N/A 03/01/2017   Procedure: LAPAROSCOPY DIAGNOSTIC;  Surgeon: Robert Bellow, MD;  Location: [facility] ORS;  Service: General;  Laterality: N/A;  . PORTA CATH INSERTION N/A 08/14/2018   Procedure: PORTA CATH INSERTION;  Surgeon: Algernon Huxley, MD;  Location: [place] CV LAB;  Service: Cardiovascular;  Laterality: N/A;  . TONSILECTOMY, ADENOIDECTOMY, BILATERAL MYRINGOTOMY [place]  . TONSILLECTOMY    . VENTRAL HERNIA REPAIR N/A 03/01/2017   10 x 14 CM Ventralight ST mesh, intraperitoneal location.   . VENTRAL HERNIA REPAIR N/A 03/01/2017   Procedure: HERNIA REPAIR VENTRAL ADULT;  Surgeon: Robert Bellow, MD;  Location: [facility] ORS;  Service: General;  Laterality: N/A;    Social History   Socioeconomic History  . Marital status: Married    Spouse name: [name]  . Number of children: Not on file  . Years of education: Not on file  . Highest education level: Not on file  Occupational History  . Not on file  Tobacco Use  . Smoking status: Former Smoker    Packs/day: 2.00    Years: 50.00    Pack years: 100.00    Types: Cigarettes, E-cigarettes    Quit date: 02/07/2012    Years since quitting: 7.0  . Smokeless tobacco: Never Used  Substance and Sexual Activity  . Alcohol use: No    Alcohol/week: 0.0 standard drinks  . Drug use: No  . Sexual activity: Yes  Other Topics Concern  . Not on file  Social History Narrative   Married   Retired   Clinical cytogeneticist level of education    No children    1 cup of coffee   Social Determinants of Radio broadcast assistant Strain:   . Difficulty of Paying Living Expenses: Not on file  Food Insecurity:   . Worried About Charity fundraiser in the Last Year: Not on file  . Ran Out of Food in the Last Year: Not on file  Transportation Needs:   . Lack of Transportation (Medical): Not on file  . Lack of Transportation (Non-Medical): Not on file  Physical Activity: Unknown  . Days of Exercise per Week: Patient refused    . Minutes of Exercise per Session: Patient refused  Stress:   . Feeling of Stress : Not on file  Social Connections: Unknown  . Frequency of Communication with Friends and Family: Patient refused  . Frequency of Social Gatherings with Friends and Family: Patient refused  . Attends Religious Services: Patient refused  . Active Member of Clubs or Organizations: Patient refused  . Attends Archivist Meetings: Patient refused  . Marital Status: Patient refused  Intimate Partner Violence:   . Fear of Current or Ex-Partner: Not on file  . Emotionally Abused: Not on file  . Physically Abused: Not on file  . Sexually Abused: Not on file    Family History  Problem Relation Age of Onset  . Hypertension Mother   . Ovarian cancer Mother 73  . Heart disease Father   . Stroke Father   . Ovarian cancer Sister        ? dx cancer had hyst.  . Breast cancer Neg Hx      Current Outpatient Medications:  .  escitalopram (LEXAPRO) 10 MG tablet, Take 1 tablet by mouth once daily, Disp: 90 tablet, Rfl: 0 .  feeding supplement, ENSURE ENLIVE, (ENSURE ENLIVE) LIQD, Take 237  mLs by mouth 2 (two) times daily between meals., Disp: 237 mL, Rfl: 12 .  folic acid (FOLVITE) 1 MG tablet, Take 1 tablet (1 mg total) by mouth daily., Disp: 30 tablet, Rfl: 3 .  ibuprofen (ADVIL,MOTRIN) 200 MG tablet, Take 400 mg by mouth every 8 (eight) hours as needed for headache or moderate pain. , Disp: , Rfl:  .  lidocaine-prilocaine (EMLA) cream, Apply to affected area once, Disp: 30 g, Rfl: 3 .  loperamide (IMODIUM) 2 MG capsule, Take 1 capsule (2 mg total) by mouth every 2 (two) hours as needed for diarrhea or loose stools. Do not exceed 16mg  daily., Disp: 30 capsule, Rfl: 1 .  Multiple Vitamins-Minerals (PRESERVISION AREDS 2) CAPS, Take 1 capsule by mouth 2 (two) times a day. , Disp: , Rfl:  .  pantoprazole (PROTONIX) 40 MG tablet, TAKE 1 TABLET BY MOUTH TWICE DAILY BEFORE A MEAL, Disp: 180 tablet, Rfl: 0 .   polyethylene glycol (MIRALAX / GLYCOLAX) 17 g packet, Take 17 g by mouth daily as needed for mild constipation., Disp: 14 each, Rfl: 0 .  rosuvastatin (CRESTOR) 20 MG tablet, Take 1 tablet by mouth once daily, Disp: 90 tablet, Rfl: 0 .  levothyroxine (SYNTHROID) 100 MCG tablet, Take 1 tablet (100 mcg total) by mouth daily before breakfast., Disp: 30 tablet, Rfl: 3 .  ondansetron (ZOFRAN) 8 MG tablet, Take 1 tablet (8 mg total) by mouth 2 (two) times daily as needed for refractory nausea / vomiting. Start on day 3 after carboplatin chemo. (Patient not taking: Reported on 03/04/2019), Disp: 30 tablet, Rfl: 1 .  prochlorperazine (COMPAZINE) 10 MG tablet, Take 1 tablet (10 mg total) by mouth every 6 (six) hours as needed (Nausea or vomiting). (Patient not taking: Reported on 03/04/2019), Disp: 30 tablet, Rfl: 1 No current facility-administered medications for this visit.  Facility-Administered Medications Ordered in Other Visits:  .  denosumab (XGEVA) injection 120 mg, 120 mg, Subcutaneous, Q30 days, Sindy Guadeloupe, MD, 120 mg at 03/05/19 1525  Physical exam:  Vitals:   03/05/19 1326  BP: (!) 146/82  Pulse: 76  Temp: (!) 96.5 F (35.8 C)  TempSrc: Tympanic  Weight: 181 lb (82.1 kg)  Height: 5\' 6"  (1.676 m)   Physical Exam Constitutional:      General: She is not in acute distress. HENT:     Head: Normocephalic and atraumatic.  Eyes:     Pupils: Pupils are equal, round, and reactive to light.  Cardiovascular:     Rate and Rhythm: Normal rate and regular rhythm.     Heart sounds: Normal heart sounds.  Pulmonary:     Effort: Pulmonary effort is normal.     Breath sounds: Normal breath sounds.  Abdominal:     General: Bowel sounds are normal.     Palpations: Abdomen is soft.  Musculoskeletal:     Cervical back: Normal range of motion.  Skin:    General: Skin is warm and dry.  Neurological:     Mental Status: She is alert and oriented to person, place, and time.      CMP Latest Ref  Rng & Units 03/05/2019  Glucose 70 - 99 mg/dL 86  BUN 8 - 23 mg/dL 27(H)  Creatinine 0.44 - 1.00 mg/dL 0.99  Sodium 135 - 145 mmol/L 138  Potassium 3.5 - 5.1 mmol/L 3.9  Chloride 98 - 111 mmol/L 102  CO2 22 - 32 mmol/L 29  Calcium 8.9 - 10.3 mg/dL 9.0  Total Protein 6.5 -  8.1 g/dL 7.5  Total Bilirubin 0.3 - 1.2 mg/dL 0.6  Alkaline Phos 38 - 126 U/L 93  AST 15 - 41 U/L 36  ALT 0 - 44 U/L 27   CBC Latest Ref Rng & Units 03/05/2019  WBC 4.0 - 10.5 K/uL 4.1  Hemoglobin 12.0 - 15.0 g/dL 11.9(L)  Hematocrit 36.0 - 46.0 % 38.9  Platelets 150 - 400 K/uL 125(L)    No images are attached to the encounter.  CT Chest W Contrast  Result Date: 02/10/2019 CLINICAL DATA:  Small-cell lung cancer with osseous metastases. EXAM: CT CHEST, ABDOMEN, AND PELVIS WITH CONTRAST TECHNIQUE: Multidetector CT imaging of the chest, abdomen and pelvis was performed following the standard protocol during bolus administration of intravenous contrast. CONTRAST:  141mL OMNIPAQUE IOHEXOL 300 MG/ML  SOLN COMPARISON:  11/10/2018 FINDINGS: CT CHEST FINDINGS Cardiovascular: The heart size is normal. No substantial pericardial effusion. Coronary artery calcification is evident. Atherosclerotic calcification is noted in the wall of the thoracic aorta. Right Port-A-Cath tip is positioned in the distal SVC. Mediastinum/Nodes: No mediastinal lymphadenopathy. There is no hilar lymphadenopathy. The esophagus has normal imaging features. There is no axillary lymphadenopathy. Lungs/Pleura: Centrilobular emphsyema noted. 4-5 mm paraspinal right upper lobe pulmonary nodule is stable in the interval. No focal airspace consolidation. No pleural effusion. Musculoskeletal: Similar appearance of the relatively diffuse sclerotic bone metastases. Nonacute posterior left rib fracture again noted. Stable compression deformity at T12. CT ABDOMEN PELVIS FINDINGS Hepatobiliary: Liver morphology compatible with cirrhosis. No focal abnormality within the  liver parenchyma. Gallbladder is surgically absent. No intrahepatic or extrahepatic biliary dilation. Pancreas: No focal mass lesion. No dilatation of the main duct. No intraparenchymal cyst. No peripancreatic edema. Spleen: No splenomegaly. No focal mass lesion. Adrenals/Urinary Tract: No adrenal nodule or mass. Tiny hypodense cortical lesions in both kidneys are too small to characterize but similar to prior. No evidence for hydroureter. Bladder is decompressed. Stomach/Bowel: Stomach is unremarkable. No gastric wall thickening. No evidence of outlet obstruction. Duodenum is normally positioned as is the ligament of Treitz. Prominent duodenal diverticulum noted. No small bowel wall thickening. No small bowel dilatation. The terminal ileum is normal. The appendix is not visualized, but there is no edema or inflammation in the region of the cecum. No gross colonic mass. No colonic wall thickening. Vascular/Lymphatic: There is abdominal aortic atherosclerosis without aneurysm. The uterus is unremarkable. There is no adnexal mass. There is no gastrohepatic or hepatoduodenal ligament lymphadenopathy. No retroperitoneal or mesenteric lymphadenopathy. No pelvic sidewall lymphadenopathy. Reproductive: The uterus is unremarkable.  There is no adnexal mass. Other: No intraperitoneal free fluid. Musculoskeletal: Diffuse sclerotic bone metastases again noted. Stable appearance L2 compression fracture and L1 superior endplate compression deformity. IMPRESSION: 1. Stable exam.  No new or progressive interval findings. 2. Right upper lobe pulmonary nodule is stable. 3. Similar appearance of the diffuse bony metastatic involvement. 4. Cirrhotic liver morphology. 5.  Aortic Atherosclerois (ICD10-170.0) 6.  Emphysema. ([id number]) Electronically Signed   By: Misty Stanley M.D.   On: 02/10/2019 09:21   NM Bone Scan Whole Body  Result Date: 02/11/2019 CLINICAL DATA:  69 year old female with small cell lung cancer, osseous  metastases. Restaging. EXAM: NUCLEAR MEDICINE WHOLE BODY BONE SCAN TECHNIQUE: Whole body anterior and posterior images were obtained approximately 3 hours after intravenous injection of radiopharmaceutical. RADIOPHARMACEUTICALS:  24.2 mCi Technetium-19m MDP IV COMPARISON:  CT Chest, Abdomen, and Pelvis from the same day reported separately. Whole-body bone scan 11/10/2018. FINDINGS: Radiotracer activity visible in both kidneys and  diminutive urinary bladder. Interval increased heterogeneity of radiotracer activity throughout the thoracolumbar spine, correlating with mixed lytic and sclerotic osseous metastases on the contemporary CT. Stable heterogeneous radiotracer activity in the pelvis. Stable scattered abnormal foci in the ribs. Stable heterogeneous radiotracer activity in the bilateral humeri and femurs. Stable appearance of the skull, no definite abnormal radiotracer activity in the skull or cervical spine. IMPRESSION: 1. Increased heterogeneous radiotracer activity throughout the thoracolumbar spine compared to October. 2. Otherwise stable scintigraphic appearance of widely scattered osseous metastatic disease. Electronically Signed   By: Genevie Ann M.D.   On: 02/11/2019 07:03   CT ABDOMEN PELVIS W CONTRAST  Result Date: 02/10/2019 CLINICAL DATA:  Small-cell lung cancer with osseous metastases. EXAM: CT CHEST, ABDOMEN, AND PELVIS WITH CONTRAST TECHNIQUE: Multidetector CT imaging of the chest, abdomen and pelvis was performed following the standard protocol during bolus administration of intravenous contrast. CONTRAST:  137mL OMNIPAQUE IOHEXOL 300 MG/ML  SOLN COMPARISON:  11/10/2018 FINDINGS: CT CHEST FINDINGS Cardiovascular: The heart size is normal. No substantial pericardial effusion. Coronary artery calcification is evident. Atherosclerotic calcification is noted in the wall of the thoracic aorta. Right Port-A-Cath tip is positioned in the distal SVC. Mediastinum/Nodes: No mediastinal lymphadenopathy.  There is no hilar lymphadenopathy. The esophagus has normal imaging features. There is no axillary lymphadenopathy. Lungs/Pleura: Centrilobular emphsyema noted. 4-5 mm paraspinal right upper lobe pulmonary nodule is stable in the interval. No focal airspace consolidation. No pleural effusion. Musculoskeletal: Similar appearance of the relatively diffuse sclerotic bone metastases. Nonacute posterior left rib fracture again noted. Stable compression deformity at T12. CT ABDOMEN PELVIS FINDINGS Hepatobiliary: Liver morphology compatible with cirrhosis. No focal abnormality within the liver parenchyma. Gallbladder is surgically absent. No intrahepatic or extrahepatic biliary dilation. Pancreas: No focal mass lesion. No dilatation of the main duct. No intraparenchymal cyst. No peripancreatic edema. Spleen: No splenomegaly. No focal mass lesion. Adrenals/Urinary Tract: No adrenal nodule or mass. Tiny hypodense cortical lesions in both kidneys are too small to characterize but similar to prior. No evidence for hydroureter. Bladder is decompressed. Stomach/Bowel: Stomach is unremarkable. No gastric wall thickening. No evidence of outlet obstruction. Duodenum is normally positioned as is the ligament of Treitz. Prominent duodenal diverticulum noted. No small bowel wall thickening. No small bowel dilatation. The terminal ileum is normal. The appendix is not visualized, but there is no edema or inflammation in the region of the cecum. No gross colonic mass. No colonic wall thickening. Vascular/Lymphatic: There is abdominal aortic atherosclerosis without aneurysm. The uterus is unremarkable. There is no adnexal mass. There is no gastrohepatic or hepatoduodenal ligament lymphadenopathy. No retroperitoneal or mesenteric lymphadenopathy. No pelvic sidewall lymphadenopathy. Reproductive: The uterus is unremarkable.  There is no adnexal mass. Other: No intraperitoneal free fluid. Musculoskeletal: Diffuse sclerotic bone metastases  again noted. Stable appearance L2 compression fracture and L1 superior endplate compression deformity. IMPRESSION: 1. Stable exam.  No new or progressive interval findings. 2. Right upper lobe pulmonary nodule is stable. 3. Similar appearance of the diffuse bony metastatic involvement. 4. Cirrhotic liver morphology. 5.  Aortic Atherosclerois (ICD10-170.0) 6.  Emphysema. ([id number]) Electronically Signed   By: Misty Stanley M.D.   On: 02/10/2019 09:21     Assessment and plan- Patient is a 69 y.o. female with extensive stage small cell lung cancer and bone metastases.   She is here for on treatment assessment prior to cycle 6 of maintenance Tecentriq  Counts okay to proceed with cycle 6 of maintenance Tecentriq today.  I will see her back  in 3 weeks for cycle 7.  Plan is to continue Tecentriq until progression or toxicity.  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  TSH is higher at 55 today.  I will increase her dose of levothyroxine from 50 to 100 mcg and repeat thyroid function tests in 6 weeks.  If she continues to remain significantly hypothyroid despite that I will refer her to endocrinology at that time  Bone metastases: We are restarting Xgeva at this time.  We had given her 1 dose of Xgeva in the past and following that she had significant hypocalcemia requiring IV calcium.  Currently her calcium levels have improved to 9.  I will repeat her calcium levels in 1 week to see if she needs possible IV calcium.  I have asked her to start taking oral calcium 1200 mg daily   Visit Diagnosis 1. Small cell lung cancer, right upper lobe ([place])   2. Encounter for antineoplastic immunotherapy   3. Autoimmune hypothyroidism      Dr. Randa Evens, MD, MPH [facility] [phone] 03/06/2019 12:30 PM

## 2019-03-10 LAB — T3, FREE: T3, Free: UNDETERMINED pg/mL

## 2019-03-10 LAB — T4: T4, Total: UNDETERMINED ug/dL

## 2019-03-11 ENCOUNTER — Other Ambulatory Visit: Payer: Self-pay

## 2019-03-12 ENCOUNTER — Other Ambulatory Visit: Payer: Self-pay

## 2019-03-12 ENCOUNTER — Inpatient Hospital Stay: Payer: PPO

## 2019-03-12 ENCOUNTER — Inpatient Hospital Stay: Payer: PPO | Attending: Oncology | Admitting: *Deleted

## 2019-03-12 DIAGNOSIS — R946 Abnormal results of thyroid function studies: Secondary | ICD-10-CM | POA: Insufficient documentation

## 2019-03-12 DIAGNOSIS — C7951 Secondary malignant neoplasm of bone: Secondary | ICD-10-CM | POA: Insufficient documentation

## 2019-03-12 DIAGNOSIS — Z791 Long term (current) use of non-steroidal anti-inflammatories (NSAID): Secondary | ICD-10-CM | POA: Insufficient documentation

## 2019-03-12 DIAGNOSIS — K219 Gastro-esophageal reflux disease without esophagitis: Secondary | ICD-10-CM | POA: Diagnosis not present

## 2019-03-12 DIAGNOSIS — E063 Autoimmune thyroiditis: Secondary | ICD-10-CM | POA: Diagnosis not present

## 2019-03-12 DIAGNOSIS — E785 Hyperlipidemia, unspecified: Secondary | ICD-10-CM | POA: Insufficient documentation

## 2019-03-12 DIAGNOSIS — Z87891 Personal history of nicotine dependence: Secondary | ICD-10-CM | POA: Diagnosis not present

## 2019-03-12 DIAGNOSIS — Z452 Encounter for adjustment and management of vascular access device: Secondary | ICD-10-CM | POA: Diagnosis not present

## 2019-03-12 DIAGNOSIS — C3411 Malignant neoplasm of upper lobe, right bronchus or lung: Secondary | ICD-10-CM | POA: Diagnosis not present

## 2019-03-12 DIAGNOSIS — Z79899 Other long term (current) drug therapy: Secondary | ICD-10-CM | POA: Insufficient documentation

## 2019-03-12 DIAGNOSIS — I10 Essential (primary) hypertension: Secondary | ICD-10-CM | POA: Diagnosis not present

## 2019-03-12 DIAGNOSIS — Z5112 Encounter for antineoplastic immunotherapy: Secondary | ICD-10-CM | POA: Diagnosis not present

## 2019-03-12 DIAGNOSIS — F329 Major depressive disorder, single episode, unspecified: Secondary | ICD-10-CM | POA: Insufficient documentation

## 2019-03-12 DIAGNOSIS — Z8249 Family history of ischemic heart disease and other diseases of the circulatory system: Secondary | ICD-10-CM | POA: Diagnosis not present

## 2019-03-12 DIAGNOSIS — Z803 Family history of malignant neoplasm of breast: Secondary | ICD-10-CM | POA: Insufficient documentation

## 2019-03-12 DIAGNOSIS — Z95828 Presence of other vascular implants and grafts: Secondary | ICD-10-CM

## 2019-03-12 DIAGNOSIS — M549 Dorsalgia, unspecified: Secondary | ICD-10-CM | POA: Insufficient documentation

## 2019-03-12 LAB — COMPREHENSIVE METABOLIC PANEL
ALT: 29 U/L (ref 0–44)
AST: 37 U/L (ref 15–41)
Albumin: 4.3 g/dL (ref 3.5–5.0)
Alkaline Phosphatase: 85 U/L (ref 38–126)
Anion gap: 9 (ref 5–15)
BUN: 17 mg/dL (ref 8–23)
CO2: 25 mmol/L (ref 22–32)
Calcium: 8.2 mg/dL — ABNORMAL LOW (ref 8.9–10.3)
Chloride: 104 mmol/L (ref 98–111)
Creatinine, Ser: 1.01 mg/dL — ABNORMAL HIGH (ref 0.44–1.00)
GFR calc Af Amer: >60 mL/min (ref >60)
GFR calc non Af Amer: 56 mL/min — ABNORMAL LOW (ref >60)
Glucose, Bld: 104 mg/dL — ABNORMAL HIGH (ref 70–99)
Potassium: 3.8 mmol/L (ref 3.5–5.1)
Sodium: 138 mmol/L (ref 135–145)
Total Bilirubin: 0.8 mg/dL (ref 0.3–1.2)
Total Protein: 7.4 g/dL (ref 6.5–8.1)

## 2019-03-12 MED ORDER — HEPARIN SOD (PORK) LOCK FLUSH 100 UNIT/ML IV SOLN
500.0000 [IU] | Freq: Once | INTRAVENOUS | Status: AC
  Administered 2019-03-12: 500 [IU] via INTRAVENOUS
  Filled 2019-03-12: qty 5

## 2019-03-12 MED ORDER — HEPARIN SOD (PORK) LOCK FLUSH 100 UNIT/ML IV SOLN
INTRAVENOUS | Status: AC
  Filled 2019-03-12: qty 5

## 2019-03-12 MED ORDER — SODIUM CHLORIDE 0.9% FLUSH
10.0000 mL | Freq: Once | INTRAVENOUS | Status: AC
  Administered 2019-03-12: 10 mL via INTRAVENOUS
  Filled 2019-03-12: qty 10

## 2019-03-12 NOTE — Progress Notes (Signed)
Patient scheduled for possible IV calcium.  Calcium level today 8.2.  Per Dr. Janese Banks no IV needed today, continue po calcium.

## 2019-03-13 ENCOUNTER — Other Ambulatory Visit: Payer: Self-pay | Admitting: Family Medicine

## 2019-03-23 ENCOUNTER — Other Ambulatory Visit: Payer: Self-pay | Admitting: Family Medicine

## 2019-03-25 ENCOUNTER — Telehealth: Payer: Self-pay | Admitting: Oncology

## 2019-03-25 NOTE — Telephone Encounter (Signed)
Sorrel will be operating on a 2 hour delay on 03-26-19. Writer phoned patient and rescheduled appts for 04-02-19.

## 2019-03-26 ENCOUNTER — Inpatient Hospital Stay: Payer: PPO | Admitting: Oncology

## 2019-03-26 ENCOUNTER — Inpatient Hospital Stay: Payer: PPO

## 2019-04-01 ENCOUNTER — Other Ambulatory Visit: Payer: Self-pay

## 2019-04-01 ENCOUNTER — Encounter: Payer: Self-pay | Admitting: Oncology

## 2019-04-01 NOTE — Progress Notes (Signed)
Patient stated that she had been doing well with no concerns. 

## 2019-04-02 ENCOUNTER — Other Ambulatory Visit: Payer: Self-pay

## 2019-04-02 ENCOUNTER — Inpatient Hospital Stay: Payer: PPO

## 2019-04-02 ENCOUNTER — Encounter: Payer: Self-pay | Admitting: Oncology

## 2019-04-02 ENCOUNTER — Telehealth: Payer: Self-pay | Admitting: *Deleted

## 2019-04-02 ENCOUNTER — Inpatient Hospital Stay (HOSPITAL_BASED_OUTPATIENT_CLINIC_OR_DEPARTMENT_OTHER): Payer: PPO | Admitting: Oncology

## 2019-04-02 VITALS — BP 129/83 | HR 77 | Temp 95.9°F | Ht 66.0 in | Wt 182.0 lb

## 2019-04-02 DIAGNOSIS — R7989 Other specified abnormal findings of blood chemistry: Secondary | ICD-10-CM | POA: Diagnosis not present

## 2019-04-02 DIAGNOSIS — C3411 Malignant neoplasm of upper lobe, right bronchus or lung: Secondary | ICD-10-CM

## 2019-04-02 DIAGNOSIS — E063 Autoimmune thyroiditis: Secondary | ICD-10-CM

## 2019-04-02 DIAGNOSIS — C7951 Secondary malignant neoplasm of bone: Secondary | ICD-10-CM

## 2019-04-02 DIAGNOSIS — Z7983 Long term (current) use of bisphosphonates: Secondary | ICD-10-CM

## 2019-04-02 DIAGNOSIS — Z5112 Encounter for antineoplastic immunotherapy: Secondary | ICD-10-CM

## 2019-04-02 LAB — CBC WITH DIFFERENTIAL/PLATELET
Abs Immature Granulocytes: 0.01 10*3/uL (ref 0.00–0.07)
Basophils Absolute: 0.1 10*3/uL (ref 0.0–0.1)
Basophils Relative: 2 %
Eosinophils Absolute: 0.2 10*3/uL (ref 0.0–0.5)
Eosinophils Relative: 4 %
HCT: 38.1 % (ref 36.0–46.0)
Hemoglobin: 12 g/dL (ref 12.0–15.0)
Immature Granulocytes: 0 %
Lymphocytes Relative: 21 %
Lymphs Abs: 0.9 10*3/uL (ref 0.7–4.0)
MCH: 30.2 pg (ref 26.0–34.0)
MCHC: 31.5 g/dL (ref 30.0–36.0)
MCV: 96 fL (ref 80.0–100.0)
Monocytes Absolute: 0.3 10*3/uL (ref 0.1–1.0)
Monocytes Relative: 6 %
Neutro Abs: 2.7 10*3/uL (ref 1.7–7.7)
Neutrophils Relative %: 67 %
Platelets: 138 10*3/uL — ABNORMAL LOW (ref 150–400)
RBC: 3.97 MIL/uL (ref 3.87–5.11)
RDW: 16.8 % — ABNORMAL HIGH (ref 11.5–15.5)
WBC: 4 10*3/uL (ref 4.0–10.5)
nRBC: 0 % (ref 0.0–0.2)

## 2019-04-02 LAB — COMPREHENSIVE METABOLIC PANEL
ALT: 34 U/L (ref 0–44)
AST: 41 U/L (ref 15–41)
Albumin: 4.5 g/dL (ref 3.5–5.0)
Alkaline Phosphatase: 82 U/L (ref 38–126)
Anion gap: 8 (ref 5–15)
BUN: 22 mg/dL (ref 8–23)
CO2: 28 mmol/L (ref 22–32)
Calcium: 8.2 mg/dL — ABNORMAL LOW (ref 8.9–10.3)
Chloride: 106 mmol/L (ref 98–111)
Creatinine, Ser: 0.87 mg/dL (ref 0.44–1.00)
GFR calc Af Amer: >60 mL/min (ref >60)
GFR calc non Af Amer: >60 mL/min (ref >60)
Glucose, Bld: 95 mg/dL (ref 70–99)
Potassium: 4 mmol/L (ref 3.5–5.1)
Sodium: 142 mmol/L (ref 135–145)
Total Bilirubin: 0.8 mg/dL (ref 0.3–1.2)
Total Protein: 7.8 g/dL (ref 6.5–8.1)

## 2019-04-02 MED ORDER — HEPARIN SOD (PORK) LOCK FLUSH 100 UNIT/ML IV SOLN
500.0000 [IU] | Freq: Once | INTRAVENOUS | Status: DC
  Filled 2019-04-02: qty 5

## 2019-04-02 MED ORDER — HEPARIN SOD (PORK) LOCK FLUSH 100 UNIT/ML IV SOLN
500.0000 [IU] | Freq: Once | INTRAVENOUS | Status: AC | PRN
  Administered 2019-04-02: 500 [IU]
  Filled 2019-04-02: qty 5

## 2019-04-02 MED ORDER — SODIUM CHLORIDE 0.9% FLUSH
10.0000 mL | Freq: Once | INTRAVENOUS | Status: AC
  Administered 2019-04-02: 10 mL via INTRAVENOUS
  Filled 2019-04-02: qty 10

## 2019-04-02 MED ORDER — SODIUM CHLORIDE 0.9 % IV SOLN
Freq: Once | INTRAVENOUS | Status: AC
  Filled 2019-04-02: qty 250

## 2019-04-02 MED ORDER — SODIUM CHLORIDE 0.9 % IV SOLN
1200.0000 mg | Freq: Once | INTRAVENOUS | Status: AC
  Administered 2019-04-02: 1200 mg via INTRAVENOUS
  Filled 2019-04-02: qty 20

## 2019-04-04 NOTE — Progress Notes (Signed)
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 She has been referred to [place] for findings of bone metastases and her recent MRI. She has a prior history of 3 packs/day day smoking for over 45 years and quit smoking 5 years ago.She had a CT chest abdomen pelvis in 2018 which showed a 5 mm lung nodule in the left lower lobe. Recently over the last 2 months patient has been having worsening back pain and was referred to orthopedics. 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 Pathologic fracture of S1.  PET scan showed 2 RUL lung nodules, hilar and mediastinal adenopathy and widespread bone mets. MRI brain negative.  Patientcompletedpalliative RT to her spine. Bronchoscopy showed small cell lung cancer. Palliative carboplatin, etoposide and Tecentriq started on 08/18/2018. Scans after 4 cycles showed stable disease. She is on maintenance tecentriq  Interval history-overall patient feels well and denies any complaints at this time  ECOG PS- 1 Pain scale- 0   Review of systems- Review of Systems  Constitutional:  Negative for chills, fever, malaise/fatigue and weight loss.  HENT: Negative for congestion, ear discharge and nosebleeds.   Eyes: Negative for blurred vision.  Respiratory: Negative for cough, hemoptysis, sputum production, shortness of breath and wheezing.   Cardiovascular: Negative for chest pain, palpitations, orthopnea and claudication.  Gastrointestinal: Negative for abdominal pain, blood in stool, constipation, diarrhea, heartburn, melena, nausea and vomiting.  Genitourinary: Negative for dysuria, flank pain, frequency, hematuria and urgency.  Musculoskeletal: Negative for back pain, joint pain and myalgias.  Skin: Negative for rash.  Neurological: Negative for dizziness, tingling, focal weakness, seizures, weakness and headaches.  Endo/Heme/Allergies: Does not bruise/bleed easily.  Psychiatric/Behavioral: Negative for depression and suicidal ideas. The patient does not have insomnia.       Allergies  Allergen Reactions  . Bee Venom Swelling     Past Medical History:  Diagnosis Date  . Arthritis   . Cirrhosis ([place])   . Depression   . GERD (gastroesophageal reflux disease)   . Hyperlipidemia   . Hypertension   . Lung cancer ([place])   . Metastatic bone cancer [facility])      Past Surgical History:  Procedure Laterality Date  . APPENDECTOMY  1971  . CHOLECYSTECTOMY  1971  . COLONOSCOPY WITH PROPOFOL N/A 11/15/2016   Procedure: COLONOSCOPY WITH PROPOFOL;  Surgeon: Jonathon Bellows, MD;  Location: [facility] ENDOSCOPY;  Service: Gastroenterology;  Laterality: N/A;  . ENDOBRONCHIAL ULTRASOUND Right 07/30/2018   Procedure: ENDOBRONCHIAL ULTRASOUND;  Surgeon: Laverle Hobby, MD;  Location: [facility] ORS;  Service: Pulmonary;  Laterality: Right;  . ESOPHAGOGASTRODUODENOSCOPY (EGD) WITH PROPOFOL N/A 01/07/2017   Procedure: ESOPHAGOGASTRODUODENOSCOPY (EGD) WITH PROPOFOL;  Surgeon: Jonathon Bellows, MD;  Location: [facility] ENDOSCOPY;  Service: Gastroenterology;  Laterality: N/A;  . LAPAROSCOPY N/A  03/01/2017   Procedure: LAPAROSCOPY DIAGNOSTIC;  Surgeon: Robert Bellow, MD;  Location: [facility] ORS;  Service: General;  Laterality: N/A;  . PORTA CATH INSERTION N/A 08/14/2018   Procedure: PORTA CATH INSERTION;  Surgeon: Algernon Huxley, MD;  Location: [facility] CV LAB;  Service: Cardiovascular;  Laterality: N/A;  . TONSILECTOMY, ADENOIDECTOMY, BILATERAL MYRINGOTOMY [place]  . TONSILLECTOMY    . VENTRAL HERNIA REPAIR N/A 03/01/2017   10 x 14 CM Ventralight ST mesh, intraperitoneal location.   . VENTRAL HERNIA REPAIR N/A 03/01/2017   Procedure: HERNIA REPAIR VENTRAL ADULT;  Surgeon: Robert Bellow, MD;  Location: [facility] ORS;  Service: General;  Laterality: N/A;    Social History   Socioeconomic History  . Marital status: Married    Spouse name: [name]  . Number of children: Not on file  . Years of education: Not on file  . Highest education level: Not on file  Occupational History  . Not on file  Tobacco Use  . Smoking status: Former Smoker    Packs/day: 2.00    Years: 50.00    Pack years: 100.00    Types: Cigarettes, E-cigarettes    Quit date: 02/07/2012    Years since quitting: 7.1  . Smokeless tobacco: Never Used  Substance and Sexual Activity  . Alcohol use: No    Alcohol/week: 0.0 standard drinks  . Drug use: No  . Sexual activity: Yes  Other Topics Concern  . Not on file  Social History Narrative   Married   Retired   Clinical cytogeneticist level of education    No children    1 cup of coffee   Social Determinants of Radio broadcast assistant Strain:   . Difficulty of Paying Living Expenses: Not on file  Food Insecurity:   . Worried About Charity fundraiser in the Last Year: Not on file  . Ran Out of Food in the Last Year: Not on file  Transportation Needs:   . Lack of Transportation (Medical): Not on file  . Lack of Transportation (Non-Medical): Not on file  Physical Activity: Unknown  . Days of Exercise per Week: Patient refused  . Minutes of  Exercise per Session: Patient refused  Stress:   . Feeling of Stress : Not on file  Social Connections: Unknown  . Frequency of Communication with Friends and Family: Patient refused  . Frequency of Social Gatherings with Friends and Family: Patient refused  . Attends Religious Services: Patient refused  . Active Member of Clubs or Organizations: Patient refused  . Attends Archivist Meetings: Patient refused  . Marital Status: Patient refused  Intimate Partner Violence:   . Fear of Current or Ex-Partner: Not on file  . Emotionally Abused: Not on file  . Physically Abused: Not on file  . Sexually Abused: Not on file    Family History  Problem Relation Age of Onset  . Hypertension Mother   . Ovarian cancer Mother 4  . Heart disease Father   . Stroke Father   . Ovarian cancer Sister        ? dx cancer had hyst.  . Breast cancer Neg Hx      Current Outpatient Medications:  .  buPROPion (WELLBUTRIN XL) 300 MG 24 hr tablet, Take 1 tablet by mouth once daily, Disp: 90 tablet, Rfl: 0 .  escitalopram (LEXAPRO) 10 MG tablet, Take 1 tablet by mouth once daily, Disp: 90  tablet, Rfl: 0 .  folic acid (FOLVITE) 1 MG tablet, Take 1 tablet (1 mg total) by mouth daily., Disp: 30 tablet, Rfl: 3 .  Multiple Vitamins-Minerals (PRESERVISION AREDS 2) CAPS, Take 1 capsule by mouth 2 (two) times a day. , Disp: , Rfl:  .  polyethylene glycol (MIRALAX / GLYCOLAX) 17 g packet, Take 17 g by mouth daily as needed for mild constipation., Disp: 14 each, Rfl: 0 .  rosuvastatin (CRESTOR) 20 MG tablet, Take 1 tablet by mouth once daily, Disp: 90 tablet, Rfl: 0 .  ibuprofen (ADVIL,MOTRIN) 200 MG tablet, Take 400 mg by mouth every 8 (eight) hours as needed for headache or moderate pain. , Disp: , Rfl:  .  levothyroxine (SYNTHROID) 100 MCG tablet, Take 1 tablet (100 mcg total) by mouth daily before breakfast. (Patient not taking: Reported on 04/01/2019), Disp: 30 tablet, Rfl: 3 .  lidocaine-prilocaine  (EMLA) cream, Apply to affected area once (Patient not taking: Reported on 04/01/2019), Disp: 30 g, Rfl: 3 .  loperamide (IMODIUM) 2 MG capsule, Take 1 capsule (2 mg total) by mouth every 2 (two) hours as needed for diarrhea or loose stools. Do not exceed 16mg  daily. (Patient not taking: Reported on 04/01/2019), Disp: 30 capsule, Rfl: 1 No current facility-administered medications for this visit.  Facility-Administered Medications Ordered in Other Visits:  .  denosumab (XGEVA) injection 120 mg, 120 mg, Subcutaneous, Q30 days, Sindy Guadeloupe, MD, 120 mg at 03/05/19 1525  Physical exam:  Vitals:   04/02/19 0937  BP: 129/83  Pulse: 77  Temp: (!) 95.9 F (35.5 C)  TempSrc: Tympanic  Weight: 182 lb (82.6 kg)  Height: 5\' 6"  (1.676 m)   Physical Exam Constitutional:      General: She is not in acute distress. HENT:     Head: Normocephalic and atraumatic.  Eyes:     Pupils: Pupils are equal, round, and reactive to light.  Cardiovascular:     Rate and Rhythm: Normal rate and regular rhythm.     Heart sounds: Normal heart sounds.  Pulmonary:     Effort: Pulmonary effort is normal.     Breath sounds: Normal breath sounds.  Abdominal:     General: Bowel sounds are normal.     Palpations: Abdomen is soft.  Musculoskeletal:     Cervical back: Normal range of motion.  Skin:    General: Skin is warm and dry.  Neurological:     Mental Status: She is alert and oriented to person, place, and time.      CMP Latest Ref Rng & Units 04/02/2019  Glucose 70 - 99 mg/dL 95  BUN 8 - 23 mg/dL 22  Creatinine 0.44 - 1.00 mg/dL 0.87  Sodium 135 - 145 mmol/L 142  Potassium 3.5 - 5.1 mmol/L 4.0  Chloride 98 - 111 mmol/L 106  CO2 22 - 32 mmol/L 28  Calcium 8.9 - 10.3 mg/dL 8.2(L)  Total Protein 6.5 - 8.1 g/dL 7.8  Total Bilirubin 0.3 - 1.2 mg/dL 0.8  Alkaline Phos 38 - 126 U/L 82  AST 15 - 41 U/L 41  ALT 0 - 44 U/L 34   CBC Latest Ref Rng & Units 04/02/2019  WBC 4.0 - 10.5 K/uL 4.0  Hemoglobin  12.0 - 15.0 g/dL 12.0  Hematocrit 36.0 - 46.0 % 38.1  Platelets 150 - 400 K/uL 138(L)    Assessment and plan- Patient is a 69 y.o. female with extensive stage small cell lung cancer and bone metastases.   She  is here for on treatment assessment prior to cycle seven of maintenance Tecentriq  Counts okay to proceed with cycle seven of maintenance Tecentriq today.  I will see her back in 3 weeks for cycle eight.  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  Repeat TSH free T3 and T4 in 3 weeks time which would be 6 weeks from her last check.  If TSH remains significantly elevated I will refer her to endocrinology.  Bone metastases: Patient's calcium is 8.2 today.  I will therefore hold her Delton See as she has developed significant hypocalcemia requiring IV calcium in the past.  Reconsider starting Xgeva when calcium is back up to nine.  Continue oral calcium at home 1200 mg daily.   Visit Diagnosis 1. Abnormal TSH   2. Small cell lung cancer, right upper lobe ([place])   3. Encounter for antineoplastic immunotherapy   4. Bone metastases ([place])   5. Long term (current) use of bisphosphonates      Dr. Randa Evens, MD, MPH [facility] [phone] 04/04/2019 1:18 PM

## 2019-04-08 ENCOUNTER — Other Ambulatory Visit: Payer: Self-pay | Admitting: *Deleted

## 2019-04-08 MED ORDER — FOLIC ACID 1 MG PO TABS: 1.0000 mg | ORAL_TABLET | Freq: Every day | ORAL | 3 refills | Status: DC

## 2019-04-20 ENCOUNTER — Ambulatory Visit (INDEPENDENT_AMBULATORY_CARE_PROVIDER_SITE_OTHER): Payer: PPO | Admitting: Family Medicine

## 2019-04-20 ENCOUNTER — Encounter: Payer: Self-pay | Admitting: Family Medicine

## 2019-04-20 ENCOUNTER — Other Ambulatory Visit: Payer: Self-pay

## 2019-04-20 DIAGNOSIS — C3411 Malignant neoplasm of upper lobe, right bronchus or lung: Secondary | ICD-10-CM

## 2019-04-20 DIAGNOSIS — C7951 Secondary malignant neoplasm of bone: Secondary | ICD-10-CM

## 2019-04-20 DIAGNOSIS — E032 Hypothyroidism due to medicaments and other exogenous substances: Secondary | ICD-10-CM | POA: Diagnosis not present

## 2019-04-20 DIAGNOSIS — F331 Major depressive disorder, recurrent, moderate: Secondary | ICD-10-CM | POA: Diagnosis not present

## 2019-04-20 NOTE — Assessment & Plan Note (Signed)
She will continue to see oncology.

## 2019-04-20 NOTE — Assessment & Plan Note (Signed)
Improved.  She will continue her current regimen.  Monitor for worsening.

## 2019-04-20 NOTE — Progress Notes (Signed)
Virtual Visit via telephone Note  This visit type was conducted due to national recommendations for restrictions regarding the COVID-19 pandemic (e.g. social distancing).  This format is felt to be most appropriate for this patient at this time.  All issues noted in this document were discussed and addressed.  No physical exam was performed (except for noted visual exam findings with Video Visits).   I connected with Anne [name] today at  1:15 PM EDT by telephone and verified that I am speaking with the correct person using two identifiers. Location patient: home Location provider: home office Persons participating in the virtual visit: patient, provider  I discussed the limitations, risks, security and privacy concerns of performing an evaluation and management service by telephone and the availability of in person appointments. I also discussed with the patient that there may be a patient responsible charge related to this service. The patient expressed understanding and agreed to proceed.  Interactive audio and video telecommunications were attempted between this provider and patient, however failed, due to patient having technical difficulties OR patient did not have access to video capability.  We continued and completed visit with audio only.   Reason for visit: follow-up  HPI: Depression: Doing well.  She is trying not to let anything bother her.  She focuses on her puzzles.  She feels this is adequately controlled.  She is on Wellbutrin and Lexapro.  No SI.  HYPOTHYROIDISM Disease Monitoring Weight changes: Some weight gain.  She reports that she was advised this was related to her Tecentriq for her small cell lung cancer.  Skin Changes: No. Heat/Cold intolerance: No.  Medication Monitoring Compliance: On Synthroid 100 mcg once daily.  This was recently increased to this dose. Last TSH:   Lab Results  Component Value Date   TSH 55.000 (H) 03/05/2019   Small cell lung cancer:  She is doing fairly well.  She is currently on maintenance treatment with Tecentriq.  She is tolerating this well with no adverse effects.  No shortness of breath.   ROS: See pertinent positives and negatives per HPI.  Past Medical History:  Diagnosis Date  . Arthritis   . Cirrhosis ([place])   . Depression   . GERD (gastroesophageal reflux disease)   . Hyperlipidemia   . Hypertension   . Lung cancer ([place])   . Metastatic bone cancer [facility])     Past Surgical History:  Procedure Laterality Date  . APPENDECTOMY  1971  . CHOLECYSTECTOMY  1971  . COLONOSCOPY WITH PROPOFOL N/A 11/15/2016   Procedure: COLONOSCOPY WITH PROPOFOL;  Surgeon: Jonathon Bellows, MD;  Location: [facility] ENDOSCOPY;  Service: Gastroenterology;  Laterality: N/A;  . ENDOBRONCHIAL ULTRASOUND Right 07/30/2018   Procedure: ENDOBRONCHIAL ULTRASOUND;  Surgeon: Laverle Hobby, MD;  Location: [facility] ORS;  Service: Pulmonary;  Laterality: Right;  . ESOPHAGOGASTRODUODENOSCOPY (EGD) WITH PROPOFOL N/A 01/07/2017   Procedure: ESOPHAGOGASTRODUODENOSCOPY (EGD) WITH PROPOFOL;  Surgeon: Jonathon Bellows, MD;  Location: [facility] ENDOSCOPY;  Service: Gastroenterology;  Laterality: N/A;  . LAPAROSCOPY N/A 03/01/2017   Procedure: LAPAROSCOPY DIAGNOSTIC;  Surgeon: Robert Bellow, MD;  Location: [facility] ORS;  Service: General;  Laterality: N/A;  . PORTA CATH INSERTION N/A 08/14/2018   Procedure: PORTA CATH INSERTION;  Surgeon: Algernon Huxley, MD;  Location: [facility];  Service: Cardiovascular;  Laterality: N/A;  . TONSILECTOMY, ADENOIDECTOMY, BILATERAL MYRINGOTOMY [place]  . TONSILLECTOMY    . VENTRAL HERNIA REPAIR N/A 03/01/2017   10 x 14 CM Ventralight ST mesh, intraperitoneal  location.   . VENTRAL HERNIA REPAIR N/A 03/01/2017   Procedure: HERNIA REPAIR VENTRAL ADULT;  Surgeon: Robert Bellow, MD;  Location: [facility] ORS;  Service: General;  Laterality: N/A;    Family History  Problem Relation Age of Onset  . Hypertension Mother   .  Ovarian cancer Mother 92  . Heart disease Father   . Stroke Father   . Ovarian cancer Sister        ? dx cancer had hyst.  . Breast cancer Neg Hx     SOCIAL HX: Former smoker   Current Outpatient Medications:  .  buPROPion (WELLBUTRIN XL) 300 MG 24 hr tablet, Take 1 tablet by mouth once daily, Disp: 90 tablet, Rfl: 0 .  escitalopram (LEXAPRO) 10 MG tablet, Take 1 tablet by mouth once daily, Disp: 90 tablet, Rfl: 0 .  folic acid (FOLVITE) 1 MG tablet, Take 1 tablet (1 mg total) by mouth daily., Disp: 30 tablet, Rfl: 3 .  ibuprofen (ADVIL,MOTRIN) 200 MG tablet, Take 400 mg by mouth every 8 (eight) hours as needed for headache or moderate pain. , Disp: , Rfl:  .  levothyroxine (SYNTHROID) 100 MCG tablet, Take 1 tablet (100 mcg total) by mouth daily before breakfast., Disp: 30 tablet, Rfl: 3 .  lidocaine-prilocaine (EMLA) cream, Apply to affected area once, Disp: 30 g, Rfl: 3 .  loperamide (IMODIUM) 2 MG capsule, Take 1 capsule (2 mg total) by mouth every 2 (two) hours as needed for diarrhea or loose stools. Do not exceed 16mg  daily., Disp: 30 capsule, Rfl: 1 .  Multiple Vitamins-Minerals (PRESERVISION AREDS 2) CAPS, Take 1 capsule by mouth 2 (two) times a day. , Disp: , Rfl:  .  polyethylene glycol (MIRALAX / GLYCOLAX) 17 g packet, Take 17 g by mouth daily as needed for mild constipation., Disp: 14 each, Rfl: 0 .  rosuvastatin (CRESTOR) 20 MG tablet, Take 1 tablet by mouth once daily, Disp: 90 tablet, Rfl: 0 No current facility-administered medications for this visit.  Facility-Administered Medications Ordered in Other Visits:  .  denosumab (XGEVA) injection 120 mg, 120 mg, Subcutaneous, Q30 days, Sindy Guadeloupe, MD, 120 mg at 03/05/19 1525  EXAM: This was a telehealth telephone visit and thus no physical exam was completed.  ASSESSMENT AND PLAN:  Discussed the following assessment and plan:  Small cell lung cancer, right upper lobe ([place]) Overall physically doing well.  She will  continue to see oncology.  I did not see weight gain as a side effect of the Tecentriq.  I suspect that is more likely related to the hypothyroidism.  Hypothyroidism She will continue with her current regimen.  She has labs to be completed with her oncology visit.  I agree with their assessment that she should see endocrinology if her TSH has not trended down significantly.  Bone metastases ([place]) She will continue to see oncology.  Depression Improved.  She will continue her current regimen.  Monitor for worsening.   No orders of the defined types were placed in this encounter.   No orders of the defined types were placed in this encounter.    I discussed the assessment and treatment plan with the patient. The patient was provided an opportunity to ask questions and all were answered. The patient agreed with the plan and demonstrated an understanding of the instructions.   The patient was advised to call back or seek an in-person evaluation if the symptoms worsen or if the condition fails to improve as anticipated.  I provided 8 minutes of non-face-to-face time during this encounter.   Tommi Rumps, MD

## 2019-04-20 NOTE — Assessment & Plan Note (Signed)
She will continue with her current regimen.  She has labs to be completed with her oncology visit.  I agree with their assessment that she should see endocrinology if her TSH has not trended down significantly.

## 2019-04-20 NOTE — Assessment & Plan Note (Signed)
Overall physically doing well.  She will continue to see oncology.  I did not see weight gain as a side effect of the Tecentriq.  I suspect that is more likely related to the hypothyroidism.

## 2019-04-22 ENCOUNTER — Other Ambulatory Visit: Payer: Self-pay

## 2019-04-22 ENCOUNTER — Encounter: Payer: Self-pay | Admitting: Oncology

## 2019-04-22 NOTE — Progress Notes (Signed)
Patient stated that she had been doing well with no complaints. 

## 2019-04-23 ENCOUNTER — Inpatient Hospital Stay: Payer: PPO

## 2019-04-23 ENCOUNTER — Telehealth: Payer: Self-pay | Admitting: *Deleted

## 2019-04-23 ENCOUNTER — Inpatient Hospital Stay (HOSPITAL_BASED_OUTPATIENT_CLINIC_OR_DEPARTMENT_OTHER): Payer: PPO | Admitting: Oncology

## 2019-04-23 ENCOUNTER — Inpatient Hospital Stay: Payer: PPO | Attending: Oncology

## 2019-04-23 ENCOUNTER — Other Ambulatory Visit: Payer: Self-pay | Admitting: *Deleted

## 2019-04-23 VITALS — BP 155/84 | HR 68 | Temp 98.0°F | Resp 20 | Wt 189.0 lb

## 2019-04-23 DIAGNOSIS — F329 Major depressive disorder, single episode, unspecified: Secondary | ICD-10-CM | POA: Insufficient documentation

## 2019-04-23 DIAGNOSIS — E669 Obesity, unspecified: Secondary | ICD-10-CM | POA: Diagnosis not present

## 2019-04-23 DIAGNOSIS — Z87891 Personal history of nicotine dependence: Secondary | ICD-10-CM | POA: Diagnosis not present

## 2019-04-23 DIAGNOSIS — I10 Essential (primary) hypertension: Secondary | ICD-10-CM | POA: Diagnosis not present

## 2019-04-23 DIAGNOSIS — E785 Hyperlipidemia, unspecified: Secondary | ICD-10-CM | POA: Diagnosis not present

## 2019-04-23 DIAGNOSIS — C3411 Malignant neoplasm of upper lobe, right bronchus or lung: Secondary | ICD-10-CM | POA: Diagnosis not present

## 2019-04-23 DIAGNOSIS — Z79899 Other long term (current) drug therapy: Secondary | ICD-10-CM | POA: Diagnosis not present

## 2019-04-23 DIAGNOSIS — Z791 Long term (current) use of non-steroidal anti-inflammatories (NSAID): Secondary | ICD-10-CM | POA: Insufficient documentation

## 2019-04-23 DIAGNOSIS — M1990 Unspecified osteoarthritis, unspecified site: Secondary | ICD-10-CM | POA: Insufficient documentation

## 2019-04-23 DIAGNOSIS — M549 Dorsalgia, unspecified: Secondary | ICD-10-CM | POA: Insufficient documentation

## 2019-04-23 DIAGNOSIS — E063 Autoimmune thyroiditis: Secondary | ICD-10-CM | POA: Insufficient documentation

## 2019-04-23 DIAGNOSIS — Z8249 Family history of ischemic heart disease and other diseases of the circulatory system: Secondary | ICD-10-CM | POA: Insufficient documentation

## 2019-04-23 DIAGNOSIS — R7989 Other specified abnormal findings of blood chemistry: Secondary | ICD-10-CM

## 2019-04-23 DIAGNOSIS — K7460 Unspecified cirrhosis of liver: Secondary | ICD-10-CM | POA: Insufficient documentation

## 2019-04-23 DIAGNOSIS — Z5112 Encounter for antineoplastic immunotherapy: Secondary | ICD-10-CM

## 2019-04-23 DIAGNOSIS — C7951 Secondary malignant neoplasm of bone: Secondary | ICD-10-CM | POA: Diagnosis not present

## 2019-04-23 DIAGNOSIS — K219 Gastro-esophageal reflux disease without esophagitis: Secondary | ICD-10-CM | POA: Diagnosis not present

## 2019-04-23 LAB — CBC WITH DIFFERENTIAL/PLATELET
Abs Immature Granulocytes: 0.01 10*3/uL (ref 0.00–0.07)
Basophils Absolute: 0 10*3/uL (ref 0.0–0.1)
Basophils Relative: 1 %
Eosinophils Absolute: 0.1 10*3/uL (ref 0.0–0.5)
Eosinophils Relative: 3 %
HCT: 35.8 % — ABNORMAL LOW (ref 36.0–46.0)
Hemoglobin: 11.3 g/dL — ABNORMAL LOW (ref 12.0–15.0)
Immature Granulocytes: 0 %
Lymphocytes Relative: 23 %
Lymphs Abs: 0.9 10*3/uL (ref 0.7–4.0)
MCH: 30.1 pg (ref 26.0–34.0)
MCHC: 31.6 g/dL (ref 30.0–36.0)
MCV: 95.2 fL (ref 80.0–100.0)
Monocytes Absolute: 0.3 10*3/uL (ref 0.1–1.0)
Monocytes Relative: 7 %
Neutro Abs: 2.5 10*3/uL (ref 1.7–7.7)
Neutrophils Relative %: 66 %
Platelets: 129 10*3/uL — ABNORMAL LOW (ref 150–400)
RBC: 3.76 MIL/uL — ABNORMAL LOW (ref 3.87–5.11)
RDW: 16.4 % — ABNORMAL HIGH (ref 11.5–15.5)
WBC: 3.9 10*3/uL — ABNORMAL LOW (ref 4.0–10.5)
nRBC: 0 % (ref 0.0–0.2)

## 2019-04-23 LAB — TSH: TSH: 54 u[IU]/mL — ABNORMAL HIGH (ref 0.350–4.500)

## 2019-04-23 LAB — COMPREHENSIVE METABOLIC PANEL
ALT: 34 U/L (ref 0–44)
AST: 36 U/L (ref 15–41)
Albumin: 4.3 g/dL (ref 3.5–5.0)
Alkaline Phosphatase: 67 U/L (ref 38–126)
Anion gap: 9 (ref 5–15)
BUN: 16 mg/dL (ref 8–23)
CO2: 26 mmol/L (ref 22–32)
Calcium: 8.4 mg/dL — ABNORMAL LOW (ref 8.9–10.3)
Chloride: 108 mmol/L (ref 98–111)
Creatinine, Ser: 0.84 mg/dL (ref 0.44–1.00)
GFR calc Af Amer: >60 mL/min (ref >60)
GFR calc non Af Amer: >60 mL/min (ref >60)
Glucose, Bld: 90 mg/dL (ref 70–99)
Potassium: 3.9 mmol/L (ref 3.5–5.1)
Sodium: 143 mmol/L (ref 135–145)
Total Bilirubin: 0.6 mg/dL (ref 0.3–1.2)
Total Protein: 7.2 g/dL (ref 6.5–8.1)

## 2019-04-23 MED ORDER — SODIUM CHLORIDE 0.9% FLUSH
10.0000 mL | Freq: Once | INTRAVENOUS | Status: AC
  Administered 2019-04-23: 10 mL via INTRAVENOUS
  Filled 2019-04-23: qty 10

## 2019-04-23 MED ORDER — HEPARIN SOD (PORK) LOCK FLUSH 100 UNIT/ML IV SOLN
500.0000 [IU] | Freq: Once | INTRAVENOUS | Status: AC | PRN
  Administered 2019-04-23: 500 [IU]
  Filled 2019-04-23: qty 5

## 2019-04-23 MED ORDER — LEVOTHYROXINE SODIUM 150 MCG PO TABS: 150.0000 ug | ORAL_TABLET | Freq: Every day | ORAL | 3 refills | Status: DC

## 2019-04-23 MED ORDER — SODIUM CHLORIDE 0.9 % IV SOLN
1200.0000 mg | Freq: Once | INTRAVENOUS | Status: AC
  Administered 2019-04-23: 1200 mg via INTRAVENOUS
  Filled 2019-04-23: qty 20

## 2019-04-23 MED ORDER — SODIUM CHLORIDE 0.9 % IV SOLN
Freq: Once | INTRAVENOUS | Status: AC
  Filled 2019-04-23: qty 250

## 2019-04-23 NOTE — Telephone Encounter (Signed)
Called pt and let her know that her tsh was elevated and we need to inc. The levothyroxine to 150 mcg. I called the pharmacy and she can break the med in 1/2 so she can take 1 and 1 /2 tablets a day. I called back to pt and told her she can break the tablets in 1/2. She states that she has 100 mcg and 50 mcg. So I told her she can take 1 of each every day so she will have a total of 150 mcg a day. I also spoke to her daughter who works at pharmacy that pt gets her prescriptions at and told her the above info. I will send referral to endocrinology but I will wait til I get an appt and call pt.

## 2019-04-23 NOTE — Telephone Encounter (Signed)
-----   Message from Wayna Chalet, [place] sent at 04/23/2019 11:53 AM EDT -----  ----- Message ----- From: Sindy Guadeloupe, MD Sent: 04/23/2019  11:41 AM EDT To: Leone Haven, MD, Luella Cook, RN, #  [place]/ [place]- please increase her levothyroxine to 150 mcg. And refer her to endcrinology please.  Dr. Caryl Bis- Jul[place]

## 2019-04-24 LAB — T3, FREE: T3, Free: 1.1 pg/mL — ABNORMAL LOW (ref 2.0–4.4)

## 2019-04-24 LAB — T4: T4, Total: 2.1 ug/dL — ABNORMAL LOW (ref 4.5–12.0)

## 2019-04-25 NOTE — Progress Notes (Signed)
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 She has been referred to [place] for findings of bone metastases and her recent MRI. She has a prior history of 3 packs/day day smoking for over 45 years and quit smoking 5 years ago.She had a CT chest abdomen pelvis in 2018 which showed a 5 mm lung nodule in the left lower lobe. Recently over the last 2 months patient has been having worsening back pain and was referred to orthopedics. 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 Pathologic fracture of S1.  PET scan showed 2 RUL lung nodules, hilar and mediastinal adenopathy and widespread bone mets. MRI brain negative.  Patientcompletedpalliative RT to her spine. Bronchoscopy showed small cell lung cancer. Palliative carboplatin, etoposide and Tecentriq started on 08/18/2018. Scans after 4 cycles showed stable disease. She is on maintenance tecentriq  Interval history-patient feels well denies any complaints at this time.  Reports that she is taking her levothyroxine regularly.  ECOG PS- 1 Pain scale- 0   Review of systems-  Review of Systems  Constitutional: Negative for chills, fever, malaise/fatigue and weight loss.  HENT: Negative for congestion, ear discharge and nosebleeds.   Eyes: Negative for blurred vision.  Respiratory: Negative for cough, hemoptysis, sputum production, shortness of breath and wheezing.   Cardiovascular: Negative for chest pain, palpitations, orthopnea and claudication.  Gastrointestinal: Negative for abdominal pain, blood in stool, constipation, diarrhea, heartburn, melena, nausea and vomiting.  Genitourinary: Negative for dysuria, flank pain, frequency, hematuria and urgency.  Musculoskeletal: Negative for back pain, joint pain and myalgias.  Skin: Negative for rash.  Neurological: Negative for dizziness, tingling, focal weakness, seizures, weakness and headaches.  Endo/Heme/Allergies: Does not bruise/bleed easily.  Psychiatric/Behavioral: Negative for depression and suicidal ideas. The patient does not have insomnia.       Allergies  Allergen Reactions  . Bee Venom Swelling     Past Medical History:  Diagnosis Date  . Arthritis   . Cirrhosis ([facility])   . Depression   . GERD (gastroesophageal reflux disease)   . Hyperlipidemia   . Hypertension   . Lung cancer ([place])   . Metastatic bone cancer [facility])      Past Surgical History:  Procedure Laterality Date  . APPENDECTOMY  1971  . CHOLECYSTECTOMY  1971  . COLONOSCOPY WITH PROPOFOL N/A 11/15/2016   Procedure: COLONOSCOPY WITH PROPOFOL;  Surgeon: Jonathon Bellows, MD;  Location: [facility] ENDOSCOPY;  Service: Gastroenterology;  Laterality: N/A;  . ENDOBRONCHIAL ULTRASOUND Right 07/30/2018   Procedure: ENDOBRONCHIAL ULTRASOUND;  Surgeon: Laverle Hobby, MD;  Location: [facility] ORS;  Service: Pulmonary;  Laterality: Right;  . ESOPHAGOGASTRODUODENOSCOPY (EGD) WITH PROPOFOL N/A 01/07/2017   Procedure: ESOPHAGOGASTRODUODENOSCOPY (EGD) WITH PROPOFOL;  Surgeon: Jonathon Bellows, MD;  Location: [facility] ENDOSCOPY;  Service: Gastroenterology;   Laterality: N/A;  . LAPAROSCOPY N/A 03/01/2017   Procedure: LAPAROSCOPY DIAGNOSTIC;  Surgeon: Robert Bellow, MD;  Location: [facility] ORS;  Service: General;  Laterality: N/A;  . PORTA CATH INSERTION N/A 08/14/2018   Procedure: PORTA CATH INSERTION;  Surgeon: Algernon Huxley, MD;  Location: [place] CV LAB;  Service: Cardiovascular;  Laterality: N/A;  . TONSILECTOMY, ADENOIDECTOMY, BILATERAL MYRINGOTOMY [place]  . TONSILLECTOMY    . VENTRAL HERNIA REPAIR N/A 03/01/2017   10 x 14 CM Ventralight ST mesh, intraperitoneal location.   . VENTRAL HERNIA REPAIR N/A 03/01/2017   Procedure: HERNIA REPAIR VENTRAL ADULT;  Surgeon: Robert Bellow, MD;  Location: [facility] ORS;  Service: General;  Laterality: N/A;    Social History   Socioeconomic History  . Marital status: Married    Spouse name: [name]  . Number of children: Not on file  . Years of education: Not on file  . Highest education level: Not on file  Occupational History  . Not on file  Tobacco Use  . Smoking status: Former Smoker    Packs/day: 2.00    Years: 50.00    Pack years: 100.00    Types: Cigarettes, E-cigarettes    Quit date: 02/07/2012    Years since quitting: 7.2  . Smokeless tobacco: Never Used  Substance and Sexual Activity  . Alcohol use: No    Alcohol/week: 0.0 standard drinks  . Drug use: No  . Sexual activity: Yes  Other Topics Concern  . Not on file  Social History Narrative   Married   Retired   Clinical cytogeneticist level of education    No children    1 cup of coffee   Social Determinants of Radio broadcast assistant Strain:   . Difficulty of Paying Living Expenses:   Food Insecurity:   . Worried About Charity fundraiser in the Last Year:   . Arboriculturist in the Last Year:   Transportation Needs:   . Film/video editor (Medical):   Marland Kitchen Lack of Transportation (Non-Medical):   Physical Activity: Unknown  . Days of Exercise per Week: Patient refused  . Minutes of Exercise per Session:  Patient refused  Stress:   . Feeling of Stress :   Social Connections: Unknown  . Frequency of Communication with Friends and Family: Patient refused  . Frequency of Social Gatherings with Friends and Family: Patient refused  . Attends Religious Services: Patient refused  . Active Member of Clubs or Organizations: Patient refused  . Attends Archivist Meetings: Patient refused  . Marital Status: Patient refused  Intimate Partner Violence:   . Fear of Current or Ex-Partner:   . Emotionally Abused:   Marland Kitchen Physically Abused:   . Sexually Abused:     Family History  Problem Relation Age of Onset  . Hypertension Mother   . Ovarian cancer Mother 56  . Heart disease Father   . Stroke Father   . Ovarian cancer Sister        ? dx cancer had hyst.  . Breast cancer Neg Hx      Current Outpatient Medications:  .  escitalopram (LEXAPRO) 10 MG tablet, Take 1 tablet by mouth once daily, Disp: 90 tablet, Rfl: 0 .  loperamide (IMODIUM) 2 MG capsule, Take 1 capsule (2 mg total) by mouth every 2 (two) hours as needed for diarrhea or loose stools. Do not exceed 16mg  daily., Disp:  30 capsule, Rfl: 1 .  Multiple Vitamins-Minerals (PRESERVISION AREDS 2) CAPS, Take 1 capsule by mouth 2 (two) times a day. , Disp: , Rfl:  .  polyethylene glycol (MIRALAX / GLYCOLAX) 17 g packet, Take 17 g by mouth daily as needed for mild constipation., Disp: 14 each, Rfl: 0 .  rosuvastatin (CRESTOR) 20 MG tablet, Take 1 tablet by mouth once daily, Disp: 90 tablet, Rfl: 0 .  buPROPion (WELLBUTRIN XL) 300 MG 24 hr tablet, Take 1 tablet by mouth once daily (Patient not taking: Reported on 04/22/2019), Disp: 90 tablet, Rfl: 0 .  folic acid (FOLVITE) 1 MG tablet, Take 1 tablet (1 mg total) by mouth daily. (Patient not taking: Reported on 04/22/2019), Disp: 30 tablet, Rfl: 3 .  ibuprofen (ADVIL,MOTRIN) 200 MG tablet, Take 400 mg by mouth every 8 (eight) hours as needed for headache or moderate pain. , Disp: , Rfl:  .   levothyroxine (SYNTHROID) 150 MCG tablet, Take 1 tablet (150 mcg total) by mouth daily before breakfast., Disp: 30 tablet, Rfl: 3 .  lidocaine-prilocaine (EMLA) cream, Apply to affected area once (Patient not taking: Reported on 04/22/2019), Disp: 30 g, Rfl: 3 No current facility-administered medications for this visit.  Facility-Administered Medications Ordered in Other Visits:  .  denosumab (XGEVA) injection 120 mg, 120 mg, Subcutaneous, Q30 days, Sindy Guadeloupe, MD, 120 mg at 03/05/19 1525  Physical exam:  Vitals:   04/23/19 0851  BP: (!) 155/84  Pulse: 68  Resp: 20  Temp: 98 F (36.7 C)  TempSrc: Tympanic  SpO2: 100%  Weight: 189 lb (85.7 kg)   Physical Exam HENT:     Head: Normocephalic and atraumatic.  Eyes:     Pupils: Pupils are equal, round, and reactive to light.  Cardiovascular:     Rate and Rhythm: Normal rate and regular rhythm.     Heart sounds: Normal heart sounds.  Pulmonary:     Effort: Pulmonary effort is normal.     Breath sounds: Normal breath sounds.  Abdominal:     General: Bowel sounds are normal.     Palpations: Abdomen is soft.  Musculoskeletal:     Cervical back: Normal range of motion.  Skin:    General: Skin is warm and dry.  Neurological:     Mental Status: She is alert and oriented to person, place, and time.      CMP Latest Ref Rng & Units 04/23/2019  Glucose 70 - 99 mg/dL 90  BUN 8 - 23 mg/dL 16  Creatinine 0.44 - 1.00 mg/dL 0.84  Sodium 135 - 145 mmol/L 143  Potassium 3.5 - 5.1 mmol/L 3.9  Chloride 98 - 111 mmol/L 108  CO2 22 - 32 mmol/L 26  Calcium 8.9 - 10.3 mg/dL 8.4(L)  Total Protein 6.5 - 8.1 g/dL 7.2  Total Bilirubin 0.3 - 1.2 mg/dL 0.6  Alkaline Phos 38 - 126 U/L 67  AST 15 - 41 U/L 36  ALT 0 - 44 U/L 34   CBC Latest Ref Rng & Units 04/23/2019  WBC 4.0 - 10.5 K/uL 3.9(L)  Hemoglobin 12.0 - 15.0 g/dL 11.3(L)  Hematocrit 36.0 - 46.0 % 35.8(L)  Platelets 150 - 400 K/uL 129(L)      Assessment and plan- Patient is a 69  y.o. female with extensive stage small cell lung cancer and bone metastases.  She is here for on treatment assessment prior to cycle 8 of maintenance Tecentriq  Counts okay to proceed with cycle 8 of maintenance Tecentriq today.  I will see her back in 3 weeks for cycle 9.  She will be due for repeat scan after 9 cycles.  Autoimmune hypothyroidism: I have been consistently going up on the dose of her levothyroxine and yet her TSH remains elevated at 54.  We will reach out to patient's daughter as well to make sure that she is taking levothyroxine daily and not combining it with other medications.  I will refer her to endocrinology at this time  Bone metastases: She still has mild hypocalcemia and has had issues with severe hypocalcemia following Zometa in the past.  We will hold her Zometa dose today   Visit Diagnosis 1. Encounter for antineoplastic immunotherapy   2. Autoimmune hypothyroidism   3. Small cell lung cancer, right upper lobe ([place])      Dr. Randa Evens, MD, MPH [facility] [phone] 04/25/2019 11:54 AM

## 2019-05-06 ENCOUNTER — Telehealth: Payer: Self-pay | Admitting: Family Medicine

## 2019-05-06 NOTE — Progress Notes (Signed)
Pharmacist Chemotherapy Monitoring - Follow Up Assessment    I verify that I have reviewed each item in the below checklist:  . Regimen for the patient is scheduled for the appropriate day and plan matches scheduled date. Marland Kitchen Appropriate non-routine labs are ordered dependent on drug ordered. . If applicable, additional medications reviewed and ordered per protocol based on lifetime cumulative doses and/or treatment regimen.   Plan for follow-up and/or issues identified: No . I-vent associated with next due treatment: No . MD and/or nursing notified: No  Payal Stanforth K 05/06/2019 8:01 AM

## 2019-05-06 NOTE — Telephone Encounter (Signed)
Patient dropped off application for a disability license plate renewal. Form is up front in Dr. Ellen Henri color folder.

## 2019-05-09 ENCOUNTER — Telehealth: Payer: Self-pay | Admitting: Family Medicine

## 2019-05-09 NOTE — Telephone Encounter (Signed)
The patient brought in a handicap placard form.  Please contact her and see what exactly she needs this for.  Is she able to walk 200 feet without stopping to rest?  Does she walk with a cane or walker?  Is she wearing oxygen?

## 2019-05-12 ENCOUNTER — Telehealth: Payer: Self-pay

## 2019-05-12 NOTE — Telephone Encounter (Signed)
Picked up the form and placed in signed basket.  Jaymon Dudek,cma

## 2019-05-12 NOTE — Telephone Encounter (Signed)
Called Dr. Vicente Males Solum's office to make sure that patient had an appointment. They stated that they had not received a referral from [place]. However, I told her that we had faxed the referral on 05/06/19 and we had received a confirmation. I told them that I would fax again the referral and confirmed the fax number and it was correct what we had. Now awaiting on appointment. I will check back again later to make sure it was scheduled.

## 2019-05-12 NOTE — Telephone Encounter (Signed)
I called the patient and she stated she cannot walk 200 ft without resting, she is walking with a cane and no she is not on oxygen.  Hodges Treiber,cma

## 2019-05-13 ENCOUNTER — Telehealth: Payer: Self-pay

## 2019-05-13 ENCOUNTER — Encounter: Payer: Self-pay | Admitting: Oncology

## 2019-05-13 NOTE — Telephone Encounter (Signed)
Completed. Given to Gae Bon to provide to the patient.

## 2019-05-13 NOTE — Telephone Encounter (Signed)
I called the patient and informed her the form was ready for pickup and it is at the front desk, she understood.  Anne [name],cma

## 2019-05-13 NOTE — Telephone Encounter (Signed)
I called the patient and informed her that her license form was ready to be picked up at the front desk.  Rian Koon,cma

## 2019-05-13 NOTE — Progress Notes (Signed)
Patient is coming in for follow up she is doing well no complaints  

## 2019-05-14 ENCOUNTER — Other Ambulatory Visit: Payer: Self-pay

## 2019-05-14 ENCOUNTER — Inpatient Hospital Stay: Payer: PPO

## 2019-05-14 ENCOUNTER — Inpatient Hospital Stay (HOSPITAL_BASED_OUTPATIENT_CLINIC_OR_DEPARTMENT_OTHER): Payer: PPO | Admitting: Oncology

## 2019-05-14 ENCOUNTER — Encounter: Payer: Self-pay | Admitting: Oncology

## 2019-05-14 ENCOUNTER — Inpatient Hospital Stay: Payer: PPO | Attending: Oncology

## 2019-05-14 VITALS — BP 140/80 | HR 74 | Temp 95.9°F | Resp 20 | Wt 189.0 lb

## 2019-05-14 DIAGNOSIS — Z5112 Encounter for antineoplastic immunotherapy: Secondary | ICD-10-CM | POA: Insufficient documentation

## 2019-05-14 DIAGNOSIS — Z79899 Other long term (current) drug therapy: Secondary | ICD-10-CM | POA: Insufficient documentation

## 2019-05-14 DIAGNOSIS — C7951 Secondary malignant neoplasm of bone: Secondary | ICD-10-CM

## 2019-05-14 DIAGNOSIS — Z9221 Personal history of antineoplastic chemotherapy: Secondary | ICD-10-CM | POA: Diagnosis not present

## 2019-05-14 DIAGNOSIS — E063 Autoimmune thyroiditis: Secondary | ICD-10-CM | POA: Diagnosis not present

## 2019-05-14 DIAGNOSIS — C3411 Malignant neoplasm of upper lobe, right bronchus or lung: Secondary | ICD-10-CM | POA: Diagnosis not present

## 2019-05-14 DIAGNOSIS — Z923 Personal history of irradiation: Secondary | ICD-10-CM | POA: Insufficient documentation

## 2019-05-14 LAB — CBC WITH DIFFERENTIAL/PLATELET
Abs Immature Granulocytes: 0.02 10*3/uL (ref 0.00–0.07)
Basophils Absolute: 0 10*3/uL (ref 0.0–0.1)
Basophils Relative: 1 %
Eosinophils Absolute: 0.2 10*3/uL (ref 0.0–0.5)
Eosinophils Relative: 5 %
HCT: 35.9 % — ABNORMAL LOW (ref 36.0–46.0)
Hemoglobin: 11.2 g/dL — ABNORMAL LOW (ref 12.0–15.0)
Immature Granulocytes: 1 %
Lymphocytes Relative: 20 %
Lymphs Abs: 0.7 10*3/uL (ref 0.7–4.0)
MCH: 30.4 pg (ref 26.0–34.0)
MCHC: 31.2 g/dL (ref 30.0–36.0)
MCV: 97.3 fL (ref 80.0–100.0)
Monocytes Absolute: 0.2 10*3/uL (ref 0.1–1.0)
Monocytes Relative: 7 %
Neutro Abs: 2.4 10*3/uL (ref 1.7–7.7)
Neutrophils Relative %: 66 %
Platelets: 118 10*3/uL — ABNORMAL LOW (ref 150–400)
RBC: 3.69 MIL/uL — ABNORMAL LOW (ref 3.87–5.11)
RDW: 15.5 % (ref 11.5–15.5)
WBC: 3.6 10*3/uL — ABNORMAL LOW (ref 4.0–10.5)
nRBC: 0 % (ref 0.0–0.2)

## 2019-05-14 LAB — COMPREHENSIVE METABOLIC PANEL
ALT: 21 U/L (ref 0–44)
AST: 30 U/L (ref 15–41)
Albumin: 3.9 g/dL (ref 3.5–5.0)
Alkaline Phosphatase: 57 U/L (ref 38–126)
Anion gap: 7 (ref 5–15)
BUN: 19 mg/dL (ref 8–23)
CO2: 29 mmol/L (ref 22–32)
Calcium: 8.3 mg/dL — ABNORMAL LOW (ref 8.9–10.3)
Chloride: 106 mmol/L (ref 98–111)
Creatinine, Ser: 0.9 mg/dL (ref 0.44–1.00)
GFR calc Af Amer: >60 mL/min (ref >60)
GFR calc non Af Amer: >60 mL/min (ref >60)
Glucose, Bld: 85 mg/dL (ref 70–99)
Potassium: 4.1 mmol/L (ref 3.5–5.1)
Sodium: 142 mmol/L (ref 135–145)
Total Bilirubin: 0.7 mg/dL (ref 0.3–1.2)
Total Protein: 7.3 g/dL (ref 6.5–8.1)

## 2019-05-14 MED ORDER — SODIUM CHLORIDE 0.9% FLUSH
10.0000 mL | Freq: Once | INTRAVENOUS | Status: AC
  Administered 2019-05-14: 10 mL via INTRAVENOUS
  Filled 2019-05-14: qty 10

## 2019-05-14 MED ORDER — SODIUM CHLORIDE 0.9 % IV SOLN
1200.0000 mg | Freq: Once | INTRAVENOUS | Status: AC
  Administered 2019-05-14: 1200 mg via INTRAVENOUS
  Filled 2019-05-14: qty 20

## 2019-05-14 MED ORDER — HEPARIN SOD (PORK) LOCK FLUSH 100 UNIT/ML IV SOLN
500.0000 [IU] | Freq: Once | INTRAVENOUS | Status: DC | PRN
  Filled 2019-05-14: qty 5

## 2019-05-14 MED ORDER — SODIUM CHLORIDE 0.9 % IV SOLN
Freq: Once | INTRAVENOUS | Status: AC
  Filled 2019-05-14: qty 250

## 2019-05-14 MED ORDER — HEPARIN SOD (PORK) LOCK FLUSH 100 UNIT/ML IV SOLN
500.0000 [IU] | Freq: Once | INTRAVENOUS | Status: AC
  Administered 2019-05-14: 500 [IU] via INTRAVENOUS
  Filled 2019-05-14: qty 5

## 2019-05-14 NOTE — Telephone Encounter (Signed)
[facility] again in reference to referral and they stated that they did receive the referral and it is under review with the provider and once they have the okay to schedule, they will contact the patient to schedule appointment.

## 2019-05-14 NOTE — Progress Notes (Signed)
Per Dr. Janese Banks pt to receive Tecentriq only today, no Zometa.

## 2019-05-15 NOTE — Progress Notes (Signed)
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 She has been referred to [place] for findings of bone metastases and her recent MRI. She has a prior history of 3 packs/day day smoking for over 45 years and quit smoking 5 years ago.She had a CT chest abdomen pelvis in 2018 which showed a 5 mm lung nodule in the left lower lobe. Recently over the last 2 months patient has been having worsening back pain and was referred to orthopedics. 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 Pathologic fracture of S1.  PET scan showed 2 RUL lung nodules, hilar and mediastinal adenopathy and widespread bone mets. MRI brain negative.  Patientcompletedpalliative RT to her spine. Bronchoscopy showed small cell lung cancer. Palliative carboplatin, etoposide and Tecentriq started on 08/18/2018. Scans after 4 cycles showed stable disease. She is on maintenance tecentriq   Interval history-patient is doing well and denies any complaints at this time.  Denies any back pain.  Reports taking her levothyroxine every morning on an empty stomach and  does not mix it with other medications.  ECOG PS- 1 Pain scale- 0   Review of systems- Review of Systems  Constitutional: Negative for chills, fever, malaise/fatigue and weight loss.  HENT: Negative for congestion, ear discharge and nosebleeds.   Eyes: Negative for blurred vision.  Respiratory: Negative for cough, hemoptysis, sputum production, shortness of breath and wheezing.   Cardiovascular: Negative for chest pain, palpitations, orthopnea and claudication.  Gastrointestinal: Negative for abdominal pain, blood in stool, constipation, diarrhea, heartburn, melena, nausea and vomiting.  Genitourinary: Negative for dysuria, flank pain, frequency, hematuria and urgency.  Musculoskeletal: Negative for back pain, joint pain and myalgias.  Skin: Negative for rash.  Neurological: Negative for dizziness, tingling, focal weakness, seizures, weakness and headaches.  Endo/Heme/Allergies: Does not bruise/bleed easily.  Psychiatric/Behavioral: Negative for depression and suicidal ideas. The patient does not have insomnia.       Allergies  Allergen Reactions  . Bee Venom Swelling     Past Medical History:  Diagnosis Date  . Arthritis   . Cirrhosis ([place])   . Depression   . GERD (gastroesophageal reflux disease)   . Hyperlipidemia   . Hypertension   . Lung cancer ([place])   . Metastatic bone cancer [facility])      Past Surgical History:  Procedure Laterality Date  . APPENDECTOMY  1971  . CHOLECYSTECTOMY  1971  . COLONOSCOPY WITH PROPOFOL N/A 11/15/2016   Procedure: COLONOSCOPY WITH PROPOFOL;  Surgeon: Jonathon Bellows, MD;  Location: [facility] ENDOSCOPY;  Service: Gastroenterology;  Laterality: N/A;  . ENDOBRONCHIAL ULTRASOUND Right 07/30/2018   Procedure: ENDOBRONCHIAL ULTRASOUND;  Surgeon: Laverle Hobby, MD;  Location: [facility] ORS;  Service: Pulmonary;  Laterality: Right;  .  ESOPHAGOGASTRODUODENOSCOPY (EGD) WITH PROPOFOL N/A 01/07/2017   Procedure: ESOPHAGOGASTRODUODENOSCOPY (EGD) WITH  PROPOFOL;  Surgeon: Jonathon Bellows, MD;  Location: [facility] ENDOSCOPY;  Service: Gastroenterology;  Laterality: N/A;  . LAPAROSCOPY N/A 03/01/2017   Procedure: LAPAROSCOPY DIAGNOSTIC;  Surgeon: Robert Bellow, MD;  Location: [facility] ORS;  Service: General;  Laterality: N/A;  . PORTA CATH INSERTION N/A 08/14/2018   Procedure: PORTA CATH INSERTION;  Surgeon: Algernon Huxley, MD;  Location: [facility];  Service: Cardiovascular;  Laterality: N/A;  . TONSILECTOMY, ADENOIDECTOMY, BILATERAL MYRINGOTOMY Max  . TONSILLECTOMY    . VENTRAL HERNIA REPAIR N/A 03/01/2017   10 x 14 CM Ventralight ST mesh, intraperitoneal location.   . VENTRAL HERNIA REPAIR N/A 03/01/2017   Procedure: HERNIA REPAIR VENTRAL ADULT;  Surgeon: Robert Bellow, MD;  Location: [facility] ORS;  Service: General;  Laterality: N/A;    Social History   Socioeconomic History  . Marital status: Married    Spouse name: [name]  . Number of children: Not on file  . Years of education: Not on file  . Highest education level: Not on file  Occupational History  . Not on file  Tobacco Use  . Smoking status: Former Smoker    Packs/day: 2.00    Years: 50.00    Pack years: 100.00    Types: Cigarettes, E-cigarettes    Quit date: 02/07/2012    Years since quitting: 7.2  . Smokeless tobacco: Never Used  Substance and Sexual Activity  . Alcohol use: No    Alcohol/week: 0.0 standard drinks  . Drug use: No  . Sexual activity: Yes  Other Topics Concern  . Not on file  Social History Narrative   Married   Retired   Clinical cytogeneticist level of education    No children    1 cup of coffee   Social Determinants of Radio broadcast assistant Strain:   . Difficulty of Paying Living Expenses:   Food Insecurity:   . Worried About Charity fundraiser in the Last Year:   . Arboriculturist in the Last Year:   Transportation Needs:   . Film/video editor (Medical):   Marland Kitchen Lack of Transportation (Non-Medical):   Physical  Activity: Unknown  . Days of Exercise per Week: Patient refused  . Minutes of Exercise per Session: Patient refused  Stress:   . Feeling of Stress :   Social Connections: Unknown  . Frequency of Communication with Friends and Family: Patient refused  . Frequency of Social Gatherings with Friends and Family: Patient refused  . Attends Religious Services: Patient refused  . Active Member of Clubs or Organizations: Patient refused  . Attends Archivist Meetings: Patient refused  . Marital Status: Patient refused  Intimate Partner Violence:   . Fear of Current or Ex-Partner:   . Emotionally Abused:   Marland Kitchen Physically Abused:   . Sexually Abused:     Family History  Problem Relation Age of Onset  . Hypertension Mother   . Ovarian cancer Mother 101  . Heart disease Father   . Stroke Father   . Ovarian cancer Sister        ? dx cancer had hyst.  . Breast cancer Neg Hx      Current Outpatient Medications:  .  escitalopram (LEXAPRO) 10 MG tablet, Take 1 tablet by mouth once daily, Disp: 90 tablet, Rfl: 0 .  ibuprofen (ADVIL,MOTRIN) 200 MG tablet, Take 400 mg by mouth every  8 (eight) hours as needed for headache or moderate pain. , Disp: , Rfl:  .  levothyroxine (SYNTHROID) 150 MCG tablet, Take 1 tablet (150 mcg total) by mouth daily before breakfast., Disp: 30 tablet, Rfl: 3 .  lidocaine-prilocaine (EMLA) cream, Apply to affected area once, Disp: 30 g, Rfl: 3 .  loperamide (IMODIUM) 2 MG capsule, Take 1 capsule (2 mg total) by mouth every 2 (two) hours as needed for diarrhea or loose stools. Do not exceed 16mg  daily., Disp: 30 capsule, Rfl: 1 .  Multiple Vitamins-Minerals (PRESERVISION AREDS 2) CAPS, Take 1 capsule by mouth 2 (two) times a day. , Disp: , Rfl:  .  polyethylene glycol (MIRALAX / GLYCOLAX) 17 g packet, Take 17 g by mouth daily as needed for mild constipation., Disp: 14 each, Rfl: 0 .  rosuvastatin (CRESTOR) 20 MG tablet, Take 1 tablet by mouth once daily, Disp: 90  tablet, Rfl: 0 .  buPROPion (WELLBUTRIN XL) 300 MG 24 hr tablet, Take 1 tablet by mouth once daily (Patient not taking: Reported on 04/22/2019), Disp: 90 tablet, Rfl: 0 .  folic acid (FOLVITE) 1 MG tablet, Take 1 tablet (1 mg total) by mouth daily. (Patient not taking: Reported on 04/22/2019), Disp: 30 tablet, Rfl: 3 No current facility-administered medications for this visit.  Facility-Administered Medications Ordered in Other Visits:  .  denosumab (XGEVA) injection 120 mg, 120 mg, Subcutaneous, Q30 days, Sindy Guadeloupe, MD, 120 mg at 03/05/19 1525  Physical exam:  Vitals:   05/14/19 0855  BP: 140/80  Pulse: 74  Resp: 20  Temp: (!) 95.9 F (35.5 C)  TempSrc: Tympanic  SpO2: 97%  Weight: 189 lb (85.7 kg)   Physical Exam Cardiovascular:     Rate and Rhythm: Normal rate and regular rhythm.     Heart sounds: Normal heart sounds.  Pulmonary:     Effort: Pulmonary effort is normal.     Breath sounds: Normal breath sounds.  Abdominal:     General: Bowel sounds are normal.     Palpations: Abdomen is soft.  Skin:    General: Skin is warm and dry.  Neurological:     Mental Status: She is alert and oriented to person, place, and time.      CMP Latest Ref Rng & Units 05/14/2019  Glucose 70 - 99 mg/dL 85  BUN 8 - 23 mg/dL 19  Creatinine 0.44 - 1.00 mg/dL 0.90  Sodium 135 - 145 mmol/L 142  Potassium 3.5 - 5.1 mmol/L 4.1  Chloride 98 - 111 mmol/L 106  CO2 22 - 32 mmol/L 29  Calcium 8.9 - 10.3 mg/dL 8.3(L)  Total Protein 6.5 - 8.1 g/dL 7.3  Total Bilirubin 0.3 - 1.2 mg/dL 0.7  Alkaline Phos 38 - 126 U/L 57  AST 15 - 41 U/L 30  ALT 0 - 44 U/L 21   CBC Latest Ref Rng & Units 05/14/2019  WBC 4.0 - 10.5 K/uL 3.6(L)  Hemoglobin 12.0 - 15.0 g/dL 11.2(L)  Hematocrit 36.0 - 46.0 % 35.9(L)  Platelets 150 - 400 K/uL 118(L)      Assessment and plan- Patient is a 69 y.o. female with extensive stage small cell lung cancer and bone metastases.    She is here for on treatment assessment  prior to cycle 9 of maintenance Tecentriq  Counts okay to proceed with cycle 9 of maintenance Tecentriq today.  I will see her back in 3 weeks for cycle 10.  Plan to get repeat CT chest abdomen and pelvis  with contrast and bone scan within the next 3 weeks.  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  Reports taking it every day without missing any doses.  We have sent endocrinology referral and we are still waiting to hear back from them  Bone metastases: She has not been able to get Xgeva due to her low calcium.  In the past when we have given her Zometa with a calcium level of more than 8 she had developed significant hypocalcemia requiring IV calcium infusions.   Visit Diagnosis 1. Small cell lung cancer, right upper lobe ([place])   2. Encounter for antineoplastic immunotherapy   3. Bone metastases ([place])   4. Autoimmune hypothyroidism      Dr. Randa Evens, MD, MPH [facility] [phone] 05/15/2019 12:28 PM

## 2019-05-18 DIAGNOSIS — H353131 Nonexudative age-related macular degeneration, bilateral, early dry stage: Secondary | ICD-10-CM | POA: Diagnosis not present

## 2019-05-19 NOTE — Telephone Encounter (Signed)
Patient was seen by Dr. Mee Hives today at [facility].

## 2019-05-27 ENCOUNTER — Ambulatory Visit: Admission: RE | Admit: 2019-05-27 | Payer: PPO | Source: Ambulatory Visit

## 2019-06-01 ENCOUNTER — Ambulatory Visit
Admission: RE | Admit: 2019-06-01 | Discharge: 2019-06-01 | Disposition: A | Discharge status: Home / Self Care | Payer: PPO | Source: Ambulatory Visit | Attending: Oncology | Admitting: Oncology

## 2019-06-01 ENCOUNTER — Other Ambulatory Visit: Payer: Self-pay

## 2019-06-01 DIAGNOSIS — C3411 Malignant neoplasm of upper lobe, right bronchus or lung: Secondary | ICD-10-CM | POA: Insufficient documentation

## 2019-06-01 DIAGNOSIS — C7951 Secondary malignant neoplasm of bone: Secondary | ICD-10-CM | POA: Diagnosis not present

## 2019-06-01 MED ORDER — IOHEXOL 300 MG/ML  SOLN
100.0000 mL | Freq: Once | INTRAMUSCULAR | Status: AC | PRN
  Administered 2019-06-01: 11:00:00 100 mL via INTRAVENOUS

## 2019-06-03 ENCOUNTER — Other Ambulatory Visit: Payer: Self-pay

## 2019-06-03 ENCOUNTER — Encounter
Admission: RE | Admit: 2019-06-03 | Discharge: 2019-06-03 | Disposition: A | Discharge status: Home / Self Care | Payer: PPO | Source: Ambulatory Visit | Attending: Oncology | Admitting: Oncology

## 2019-06-03 DIAGNOSIS — C3411 Malignant neoplasm of upper lobe, right bronchus or lung: Secondary | ICD-10-CM

## 2019-06-03 DIAGNOSIS — C3490 Malignant neoplasm of unspecified part of unspecified bronchus or lung: Secondary | ICD-10-CM | POA: Diagnosis not present

## 2019-06-03 MED ORDER — TECHNETIUM TC 99M MEDRONATE IV KIT
20.0000 | PACK | Freq: Once | INTRAVENOUS | Status: AC | PRN
  Administered 2019-06-03: 09:00:00 20.19 via INTRAVENOUS

## 2019-06-04 ENCOUNTER — Ambulatory Visit: Payer: PPO

## 2019-06-04 ENCOUNTER — Inpatient Hospital Stay: Payer: PPO

## 2019-06-04 ENCOUNTER — Encounter: Payer: Self-pay | Admitting: Oncology

## 2019-06-04 ENCOUNTER — Inpatient Hospital Stay (HOSPITAL_BASED_OUTPATIENT_CLINIC_OR_DEPARTMENT_OTHER): Payer: PPO | Admitting: Oncology

## 2019-06-04 ENCOUNTER — Telehealth: Payer: Self-pay

## 2019-06-04 VITALS — BP 122/77 | HR 80 | Temp 95.5°F | Resp 16 | Wt 184.3 lb

## 2019-06-04 DIAGNOSIS — C3411 Malignant neoplasm of upper lobe, right bronchus or lung: Secondary | ICD-10-CM | POA: Diagnosis not present

## 2019-06-04 DIAGNOSIS — C7951 Secondary malignant neoplasm of bone: Secondary | ICD-10-CM

## 2019-06-04 DIAGNOSIS — E063 Autoimmune thyroiditis: Secondary | ICD-10-CM

## 2019-06-04 DIAGNOSIS — Z5112 Encounter for antineoplastic immunotherapy: Secondary | ICD-10-CM

## 2019-06-04 LAB — CBC WITH DIFFERENTIAL/PLATELET
Abs Immature Granulocytes: 0.02 10*3/uL (ref 0.00–0.07)
Basophils Absolute: 0.1 10*3/uL (ref 0.0–0.1)
Basophils Relative: 1 %
Eosinophils Absolute: 0.3 10*3/uL (ref 0.0–0.5)
Eosinophils Relative: 8 %
HCT: 38 % (ref 36.0–46.0)
Hemoglobin: 12 g/dL (ref 12.0–15.0)
Immature Granulocytes: 1 %
Lymphocytes Relative: 21 %
Lymphs Abs: 0.8 10*3/uL (ref 0.7–4.0)
MCH: 29.9 pg (ref 26.0–34.0)
MCHC: 31.6 g/dL (ref 30.0–36.0)
MCV: 94.8 fL (ref 80.0–100.0)
Monocytes Absolute: 0.3 10*3/uL (ref 0.1–1.0)
Monocytes Relative: 8 %
Neutro Abs: 2.5 10*3/uL (ref 1.7–7.7)
Neutrophils Relative %: 61 %
Platelets: 136 10*3/uL — ABNORMAL LOW (ref 150–400)
RBC: 4.01 MIL/uL (ref 3.87–5.11)
RDW: 14.3 % (ref 11.5–15.5)
WBC: 4 10*3/uL (ref 4.0–10.5)
nRBC: 0 % (ref 0.0–0.2)

## 2019-06-04 LAB — COMPREHENSIVE METABOLIC PANEL
ALT: 22 U/L (ref 0–44)
AST: 25 U/L (ref 15–41)
Albumin: 3.9 g/dL (ref 3.5–5.0)
Alkaline Phosphatase: 60 U/L (ref 38–126)
Anion gap: 9 (ref 5–15)
BUN: 19 mg/dL (ref 8–23)
CO2: 28 mmol/L (ref 22–32)
Calcium: 8.7 mg/dL — ABNORMAL LOW (ref 8.9–10.3)
Chloride: 104 mmol/L (ref 98–111)
Creatinine, Ser: 0.73 mg/dL (ref 0.44–1.00)
GFR calc Af Amer: >60 mL/min (ref >60)
GFR calc non Af Amer: >60 mL/min (ref >60)
Glucose, Bld: 103 mg/dL — ABNORMAL HIGH (ref 70–99)
Potassium: 4 mmol/L (ref 3.5–5.1)
Sodium: 141 mmol/L (ref 135–145)
Total Bilirubin: 0.6 mg/dL (ref 0.3–1.2)
Total Protein: 7.2 g/dL (ref 6.5–8.1)

## 2019-06-04 LAB — TSH: TSH: 5.333 u[IU]/mL — ABNORMAL HIGH (ref 0.350–4.500)

## 2019-06-04 MED ORDER — SODIUM CHLORIDE 0.9 % IV SOLN
1200.0000 mg | Freq: Once | INTRAVENOUS | Status: AC
  Administered 2019-06-04: 10:00:00 1200 mg via INTRAVENOUS
  Filled 2019-06-04: qty 20

## 2019-06-04 MED ORDER — SODIUM CHLORIDE 0.9 % IV SOLN
Freq: Once | INTRAVENOUS | Status: AC
  Filled 2019-06-04: qty 250

## 2019-06-04 MED ORDER — HEPARIN SOD (PORK) LOCK FLUSH 100 UNIT/ML IV SOLN
500.0000 [IU] | Freq: Once | INTRAVENOUS | Status: AC | PRN
  Administered 2019-06-04: 11:00:00 500 [IU]
  Filled 2019-06-04: qty 5

## 2019-06-04 MED ORDER — SODIUM CHLORIDE 0.9% FLUSH
10.0000 mL | Freq: Once | INTRAVENOUS | Status: AC
  Administered 2019-06-04: 09:00:00 10 mL via INTRAVENOUS
  Filled 2019-06-04: qty 10

## 2019-06-04 NOTE — Progress Notes (Signed)
Patient here for oncology follow-up appointment, expresses no complaints or concerns at this time.    

## 2019-06-06 NOTE — Progress Notes (Signed)
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 She has been referred to [place] for findings of bone metastases and her recent MRI. She has a prior history of 3 packs/day day smoking for over 45 years and quit smoking 5 years ago.She had a CT chest abdomen pelvis in 2018 which showed a 5 mm lung nodule in the left lower lobe. Recently over the last 2 months patient has been having worsening back pain and was referred to orthopedics. 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 Pathologic fracture of S1.  PET scan showed 2 RUL lung nodules, hilar and mediastinal adenopathy and widespread bone mets. MRI brain negative.  Patientcompletedpalliative RT to her spine. Bronchoscopy showed small cell lung cancer. Palliative carboplatin, etoposide and Tecentriq started on 08/18/2018. Scans after 4 cycles showed stable disease. She is on maintenance tecentriq  Interval history-patient reports doing well and currently denies any complaints.  Denies anyBack pain.  She has lost 5 pounds in the last few weeks.  ECOG PS- 1 Pain scale-  0   Review of systems- Review of Systems  Constitutional: Negative for chills, fever, malaise/fatigue and weight loss.  HENT: Negative for congestion, ear discharge and nosebleeds.   Eyes: Negative for blurred vision.  Respiratory: Negative for cough, hemoptysis, sputum production, shortness of breath and wheezing.   Cardiovascular: Negative for chest pain, palpitations, orthopnea and claudication.  Gastrointestinal: Negative for abdominal pain, blood in stool, constipation, diarrhea, heartburn, melena, nausea and vomiting.  Genitourinary: Negative for dysuria, flank pain, frequency, hematuria and urgency.  Musculoskeletal: Negative for back pain, joint pain and myalgias.  Skin: Negative for rash.  Neurological: Negative for dizziness, tingling, focal weakness, seizures, weakness and headaches.  Endo/Heme/Allergies: Does not bruise/bleed easily.  Psychiatric/Behavioral: Negative for depression and suicidal ideas. The patient does not have insomnia.       Allergies  Allergen Reactions  . Bee Venom Swelling     Past Medical History:  Diagnosis Date  . Arthritis   . Cirrhosis ([place])   . Depression   . GERD (gastroesophageal reflux disease)   . Hyperlipidemia   . Hypertension   . Lung cancer ([place])   . Metastatic bone cancer [facility])      Past Surgical History:  Procedure Laterality Date  . APPENDECTOMY  1971  . CHOLECYSTECTOMY  1971  . COLONOSCOPY WITH PROPOFOL N/A 11/15/2016   Procedure: COLONOSCOPY WITH PROPOFOL;  Surgeon: Jonathon Bellows, MD;  Location: [facility] ENDOSCOPY;  Service: Gastroenterology;  Laterality: N/A;  . ENDOBRONCHIAL ULTRASOUND Right 07/30/2018   Procedure: ENDOBRONCHIAL ULTRASOUND;  Surgeon: Laverle Hobby, MD;  Location: [facility] ORS;  Service: Pulmonary;  Laterality: Right;  . ESOPHAGOGASTRODUODENOSCOPY (EGD) WITH PROPOFOL N/A 01/07/2017   Procedure: ESOPHAGOGASTRODUODENOSCOPY (EGD)  WITH PROPOFOL;  Surgeon: Jonathon Bellows, MD;  Location: [facility] ENDOSCOPY;  Service:  Gastroenterology;  Laterality: N/A;  . LAPAROSCOPY N/A 03/01/2017   Procedure: LAPAROSCOPY DIAGNOSTIC;  Surgeon: Robert Bellow, MD;  Location: [facility] ORS;  Service: General;  Laterality: N/A;  . PORTA CATH INSERTION N/A 08/14/2018   Procedure: PORTA CATH INSERTION;  Surgeon: Algernon Huxley, MD;  Location: [facility] CV LAB;  Service: Cardiovascular;  Laterality: N/A;  . TONSILECTOMY, ADENOIDECTOMY, BILATERAL MYRINGOTOMY [place]  . TONSILLECTOMY    . VENTRAL HERNIA REPAIR N/A 03/01/2017   10 x 14 CM Ventralight ST mesh, intraperitoneal location.   . VENTRAL HERNIA REPAIR N/A 03/01/2017   Procedure: HERNIA REPAIR VENTRAL ADULT;  Surgeon: Robert Bellow, MD;  Location: [facility] ORS;  Service: General;  Laterality: N/A;    Social History   Socioeconomic History  . Marital status: Married    Spouse name: [name]  . Number of children: Not on file  . Years of education: Not on file  . Highest education level: Not on file  Occupational History  . Not on file  Tobacco Use  . Smoking status: Former Smoker    Packs/day: 2.00    Years: 50.00    Pack years: 100.00    Types: Cigarettes, E-cigarettes    Quit date: 02/07/2012    Years since quitting: 7.3  . Smokeless tobacco: Never Used  Substance and Sexual Activity  . Alcohol use: No    Alcohol/week: 0.0 standard drinks  . Drug use: No  . Sexual activity: Yes  Other Topics Concern  . Not on file  Social History Narrative   Married   Retired   Clinical cytogeneticist level of education    No children    1 cup of coffee   Social Determinants of Radio broadcast assistant Strain:   . Difficulty of Paying Living Expenses:   Food Insecurity:   . Worried About Charity fundraiser in the Last Year:   . Arboriculturist in the Last Year:   Transportation Needs:   . Film/video editor (Medical):   Marland Kitchen Lack of Transportation (Non-Medical):   Physical Activity: Unknown  . Days of Exercise per Week: Patient refused  . Minutes of  Exercise per Session: Patient refused  Stress:   . Feeling of Stress :   Social Connections: Unknown  . Frequency of Communication with Friends and Family: Patient refused  . Frequency of Social Gatherings with Friends and Family: Patient refused  . Attends Religious Services: Patient refused  . Active Member of Clubs or Organizations: Patient refused  . Attends Archivist Meetings: Patient refused  . Marital Status: Patient refused  Intimate Partner Violence:   . Fear of Current or Ex-Partner:   . Emotionally Abused:   Marland Kitchen Physically Abused:   . Sexually Abused:     Family History  Problem Relation Age of Onset  . Hypertension Mother   . Ovarian cancer Mother 85  . Heart disease Father   . Stroke Father   . Ovarian cancer Sister        ? dx cancer had hyst.  . Breast cancer Neg Hx      Current Outpatient Medications:  .  escitalopram (LEXAPRO) 10 MG tablet, Take 1 tablet by mouth once daily, Disp: 90 tablet, Rfl: 0 .  ibuprofen (ADVIL,MOTRIN) 200 MG tablet, Take 400 mg by mouth every 8 (eight) hours as needed for headache or moderate pain. ,  Disp: , Rfl:  .  levothyroxine (SYNTHROID) 150 MCG tablet, Take 1 tablet (150 mcg total) by mouth daily before breakfast., Disp: 30 tablet, Rfl: 3 .  lidocaine-prilocaine (EMLA) cream, Apply to affected area once, Disp: 30 g, Rfl: 3 .  loperamide (IMODIUM) 2 MG capsule, Take 1 capsule (2 mg total) by mouth every 2 (two) hours as needed for diarrhea or loose stools. Do not exceed 16mg  daily., Disp: 30 capsule, Rfl: 1 .  Multiple Vitamins-Minerals (PRESERVISION AREDS 2) CAPS, Take 1 capsule by mouth 2 (two) times a day. , Disp: , Rfl:  .  rosuvastatin (CRESTOR) 20 MG tablet, Take 1 tablet by mouth once daily, Disp: 90 tablet, Rfl: 0 .  buPROPion (WELLBUTRIN XL) 300 MG 24 hr tablet, Take 1 tablet by mouth once daily (Patient not taking: Reported on 04/22/2019), Disp: 90 tablet, Rfl: 0 .  folic acid (FOLVITE) 1 MG tablet, Take 1 tablet  (1 mg total) by mouth daily. (Patient not taking: Reported on 04/22/2019), Disp: 30 tablet, Rfl: 3 .  polyethylene glycol (MIRALAX / GLYCOLAX) 17 g packet, Take 17 g by mouth daily as needed for mild constipation. (Patient not taking: Reported on 06/04/2019), Disp: 14 each, Rfl: 0 No current facility-administered medications for this visit.  Facility-Administered Medications Ordered in Other Visits:  .  denosumab (XGEVA) injection 120 mg, 120 mg, Subcutaneous, Q30 days, Sindy Guadeloupe, MD, 120 mg at 03/05/19 1525  Physical exam:  Vitals:   06/04/19 0914  BP: 122/77  Pulse: 80  Resp: 16  Temp: (!) 95.5 F (35.3 C)  TempSrc: Tympanic  SpO2: 95%  Weight: 184 lb 4.8 oz (83.6 kg)   Physical Exam Constitutional:      General: She is not in acute distress. Cardiovascular:     Rate and Rhythm: Normal rate and regular rhythm.     Heart sounds: Normal heart sounds.  Pulmonary:     Effort: Pulmonary effort is normal.     Breath sounds: Normal breath sounds.  Abdominal:     General: Bowel sounds are normal.     Palpations: Abdomen is soft.  Skin:    General: Skin is warm and dry.  Neurological:     Mental Status: She is alert and oriented to person, place, and time.      CMP Latest Ref Rng & Units 06/04/2019  Glucose 70 - 99 mg/dL 103(H)  BUN 8 - 23 mg/dL 19  Creatinine 0.44 - 1.00 mg/dL 0.73  Sodium 135 - 145 mmol/L 141  Potassium 3.5 - 5.1 mmol/L 4.0  Chloride 98 - 111 mmol/L 104  CO2 22 - 32 mmol/L 28  Calcium 8.9 - 10.3 mg/dL 8.7(L)  Total Protein 6.5 - 8.1 g/dL 7.2  Total Bilirubin 0.3 - 1.2 mg/dL 0.6  Alkaline Phos 38 - 126 U/L 60  AST 15 - 41 U/L 25  ALT 0 - 44 U/L 22   CBC Latest Ref Rng & Units 06/04/2019  WBC 4.0 - 10.5 K/uL 4.0  Hemoglobin 12.0 - 15.0 g/dL 12.0  Hematocrit 36.0 - 46.0 % 38.0  Platelets 150 - 400 K/uL 136(L)    No images are attached to the encounter.  CT Chest W Contrast  Result Date: 06/01/2019 CLINICAL DATA:  Metastatic right upper lobe  small cell lung cancer restaging EXAM: CT CHEST, ABDOMEN, AND PELVIS WITH CONTRAST TECHNIQUE: Multidetector CT imaging of the chest, abdomen and pelvis was performed following the standard protocol during bolus administration of intravenous contrast. CONTRAST:  157mL OMNIPAQUE  IOHEXOL 300 MG/ML  SOLN COMPARISON:  02/10/2019 FINDINGS: CT CHEST FINDINGS Cardiovascular: Right Port-A-Cath tip: SVC. Coronary, aortic arch, and branch vessel atherosclerotic vascular disease. Mediastinum/Nodes: Unremarkable Lungs/Pleura: Right upper lobe nodule posteriorly measures 0.5 by 0.3 cm on image 42/4, stable by my measurements. Minimal adjacent subpleural nodularity. Musculoskeletal: Stable distribution of osseous metastatic disease in the thorax, notable in the ribs and thoracic spine. Stable superior and inferior endplate compressions at the T2 level. CT ABDOMEN PELVIS FINDINGS Hepatobiliary: Cirrhosis. Cholecystectomy. No significant focal liver lesion identified. Pancreas: Unremarkable Spleen: Unremarkable Adrenals/Urinary Tract: Small hypodense left renal lesions are technically too small to characterize but statistically likely to be cysts, and unchanged from prior. The adrenal glands appear normal. Stomach/Bowel: Transverse duodenal diverticulum. Otherwise unremarkable. Vascular/Lymphatic: Aortoiliac atherosclerotic vascular disease. 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 No pathologic adenopathy. Reproductive: Unremarkable Other: No supplemental non-categorized findings. Musculoskeletal: Stable sclerotic lesions in the bony pelvis and lumbar spine, with similar compressions noted at L1, L2, L3, L4, and S1. Stable grade 1 anterolisthesis at L4-5 with stable grade 1 retrolisthesis at L2-3 and L3-4. Stable posterior bony retropulsion related to the compression fracture at L2. IMPRESSION: 1. Stable distribution of osseous metastatic disease in the thorax, abdomen, and pelvis. 2. Stable 5 by 3 mm right  upper lobe pulmonary nodule. 3. Other imaging findings of potential clinical significance: Coronary atherosclerosis. Cirrhosis. 4. 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 Ectatic abdominal aorta at risk for aneurysm development. Recommend followup by ultrasound in 5 years. This recommendation follows ACR consensus guidelines: White Paper of the ACR Incidental Findings Committee II on Vascular Findings. J Am Coll Radiol 2013; 86:578-469. 5. Aortic atherosclerosis. Aortic Atherosclerosis (ICD10-I70.0). Electronically Signed   By: Van Clines M.D.   On: 06/01/2019 14:06   NM Bone Scan Whole Body  Result Date: 06/03/2019 CLINICAL DATA:  Small cell lung cancer. EXAM: NUCLEAR MEDICINE WHOLE BODY BONE SCAN TECHNIQUE: Whole body anterior and posterior images were obtained approximately 3 hours after intravenous injection of radiopharmaceutical. RADIOPHARMACEUTICALS:  20.19 mCi Technetium-52m MDP IV COMPARISON:  February 10, 2019 FINDINGS: Multiple small foci of increased tracer uptake are seen throughout the thoracolumbar spine. The degree of tracer uptake seen within these regions is decreased in severity when compared to the prior study. Multiple small focal areas of increased uptake are again seen involving the bilateral humerus, bilateral femurs and multiple bilateral ribs. These areas are predominantly stable in appearance. Increased tracer uptake within the regions near the bilateral sacroiliac joints is again seen. Physiologic tracer uptake is seen within the bilateral kidneys with suspected tracer activity noted within the urinary bladder. IMPRESSION: Predominant stable scintigraphic appearance consistent with diffuse osseous metastasis. Electronically Signed   By: Virgina Norfolk M.D.   On: 06/03/2019 20:23   CT ABDOMEN PELVIS W CONTRAST  Result Date: 06/01/2019 CLINICAL DATA:  Metastatic right upper lobe small cell lung cancer restaging EXAM: CT CHEST, ABDOMEN, AND PELVIS WITH  CONTRAST TECHNIQUE: Multidetector CT imaging of the chest, abdomen and pelvis was performed following the standard protocol during bolus administration of intravenous contrast. CONTRAST:  169mL OMNIPAQUE IOHEXOL 300 MG/ML  SOLN COMPARISON:  02/10/2019 FINDINGS: CT CHEST FINDINGS Cardiovascular: Right Port-A-Cath tip: SVC. Coronary, aortic arch, and branch vessel atherosclerotic vascular disease. Mediastinum/Nodes: Unremarkable Lungs/Pleura: Right upper lobe nodule posteriorly measures 0.5 by 0.3 cm on image 42/4, stable by my measurements. Minimal adjacent subpleural nodularity. Musculoskeletal: Stable distribution of osseous metastatic disease in the thorax, notable in the ribs  and thoracic spine. Stable superior and inferior endplate compressions at the T2 level. CT ABDOMEN PELVIS FINDINGS Hepatobiliary: Cirrhosis. Cholecystectomy. No significant focal liver lesion identified. Pancreas: Unremarkable Spleen: Unremarkable Adrenals/Urinary Tract: Small hypodense left renal lesions are technically too small to characterize but statistically likely to be cysts, and unchanged from prior. The adrenal glands appear normal. Stomach/Bowel: Transverse duodenal diverticulum. Otherwise unremarkable. Vascular/Lymphatic: Aortoiliac atherosclerotic vascular disease. 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 No pathologic adenopathy. Reproductive: Unremarkable Other: No supplemental non-categorized findings. Musculoskeletal: Stable sclerotic lesions in the bony pelvis and lumbar spine, with similar compressions noted at L1, L2, L3, L4, and S1. Stable grade 1 anterolisthesis at L4-5 with stable grade 1 retrolisthesis at L2-3 and L3-4. Stable posterior bony retropulsion related to the compression fracture at L2. IMPRESSION: 1. Stable distribution of osseous metastatic disease in the thorax, abdomen, and pelvis. 2. Stable 5 by 3 mm right upper lobe pulmonary nodule. 3. Other imaging findings of potential clinical  significance: Coronary atherosclerosis. Cirrhosis. 4. 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 Ectatic abdominal aorta at risk for aneurysm development. Recommend followup by ultrasound in 5 years. This recommendation follows ACR consensus guidelines: White Paper of the ACR Incidental Findings Committee II on Vascular Findings. J Am Coll Radiol 2013; 15:176-160. 5. Aortic atherosclerosis. Aortic Atherosclerosis (ICD10-I70.0). Electronically Signed   By: Van Clines M.D.   On: 06/01/2019 14:06     Assessment and plan- Patient is a 69 y.o. female with extensive stage small cell lung cancer and bone metastases.  She is here for on treatment assessment prior to cycle 10 of maintenance Tecentriq.   Counts okay to proceed with cycle 10 of maintenance Tecentriq today.  I have reviewed her CT chest abdomen and pelvis with contrast images as well as bone scan independently and discussed findings with her.  Overall her bone metastases appear stable.  Right upper lobe lung nodule is also stable.  No mediastinal adenopathy or new areas of metastatic disease to suggest disease progression.  She will therefore continue Tecentriq until progression or toxicity.  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  Her TSH was consistently going up despite increasing her dose of levothyroxine when the value steadily rose from 40 January 24, 2053 in March 2021.  Finally has come down to 5.3 today.  She is currently taking 150 mcg of levothyroxine and I will defer further dose adjustment to endocrinology.  I will continue to monitor TSH every 6 weeks.  Bone metastases: Corrected calcium today is 8.7 I will continue to hold her Zometa as she has significant apnea after she receives bisphosphonates.  We will consider restarting it when calcium levels are 9 or higher. Visit Diagnosis 1. Encounter for antineoplastic immunotherapy   2. Autoimmune hypothyroidism   3.  Small cell lung cancer, right upper lobe ([place])   4. Bone metastases ([place])      Dr. Randa Evens, MD, MPH [facility] [phone] 06/06/2019 4:07 PM

## 2019-06-18 NOTE — Progress Notes (Signed)
Pharmacist Chemotherapy Monitoring - Follow Up Assessment    I verify that I have reviewed each item in the below checklist:  . Regimen for the patient is scheduled for the appropriate day and plan matches scheduled date. Marland Kitchen Appropriate non-routine labs are ordered dependent on drug ordered. . If applicable, additional medications reviewed and ordered per protocol based on lifetime cumulative doses and/or treatment regimen.   Plan for follow-up and/or issues identified: Yes -need orders . I-vent associated with next due treatment: No . MD and/or nursing notified: No  Adelina Mings 06/18/2019 8:38 AM

## 2019-06-25 ENCOUNTER — Inpatient Hospital Stay: Payer: PPO

## 2019-06-25 ENCOUNTER — Other Ambulatory Visit: Payer: Self-pay

## 2019-06-25 ENCOUNTER — Inpatient Hospital Stay (HOSPITAL_BASED_OUTPATIENT_CLINIC_OR_DEPARTMENT_OTHER): Payer: PPO | Admitting: Oncology

## 2019-06-25 ENCOUNTER — Encounter: Payer: Self-pay | Admitting: Oncology

## 2019-06-25 ENCOUNTER — Inpatient Hospital Stay: Payer: PPO | Attending: Oncology

## 2019-06-25 VITALS — BP 134/74 | HR 75 | Temp 98.2°F | Resp 16 | Ht 66.0 in | Wt 189.0 lb

## 2019-06-25 VITALS — BP 129/77 | HR 68 | Resp 16

## 2019-06-25 DIAGNOSIS — D696 Thrombocytopenia, unspecified: Secondary | ICD-10-CM

## 2019-06-25 DIAGNOSIS — C3411 Malignant neoplasm of upper lobe, right bronchus or lung: Secondary | ICD-10-CM | POA: Diagnosis not present

## 2019-06-25 DIAGNOSIS — R918 Other nonspecific abnormal finding of lung field: Secondary | ICD-10-CM | POA: Insufficient documentation

## 2019-06-25 DIAGNOSIS — C3432 Malignant neoplasm of lower lobe, left bronchus or lung: Secondary | ICD-10-CM | POA: Insufficient documentation

## 2019-06-25 DIAGNOSIS — Z87891 Personal history of nicotine dependence: Secondary | ICD-10-CM | POA: Diagnosis not present

## 2019-06-25 DIAGNOSIS — C7951 Secondary malignant neoplasm of bone: Secondary | ICD-10-CM | POA: Diagnosis not present

## 2019-06-25 DIAGNOSIS — Z5112 Encounter for antineoplastic immunotherapy: Secondary | ICD-10-CM | POA: Insufficient documentation

## 2019-06-25 DIAGNOSIS — Z8041 Family history of malignant neoplasm of ovary: Secondary | ICD-10-CM | POA: Diagnosis not present

## 2019-06-25 DIAGNOSIS — E063 Autoimmune thyroiditis: Secondary | ICD-10-CM | POA: Diagnosis not present

## 2019-06-25 DIAGNOSIS — Z923 Personal history of irradiation: Secondary | ICD-10-CM | POA: Insufficient documentation

## 2019-06-25 DIAGNOSIS — Z95828 Presence of other vascular implants and grafts: Secondary | ICD-10-CM

## 2019-06-25 LAB — CBC WITH DIFFERENTIAL/PLATELET
Abs Immature Granulocytes: 0.01 10*3/uL (ref 0.00–0.07)
Basophils Absolute: 0 10*3/uL (ref 0.0–0.1)
Basophils Relative: 1 %
Eosinophils Absolute: 0.2 10*3/uL (ref 0.0–0.5)
Eosinophils Relative: 5 %
HCT: 34.2 % — ABNORMAL LOW (ref 36.0–46.0)
Hemoglobin: 11 g/dL — ABNORMAL LOW (ref 12.0–15.0)
Immature Granulocytes: 0 %
Lymphocytes Relative: 16 %
Lymphs Abs: 0.7 10*3/uL (ref 0.7–4.0)
MCH: 29.9 pg (ref 26.0–34.0)
MCHC: 32.2 g/dL (ref 30.0–36.0)
MCV: 92.9 fL (ref 80.0–100.0)
Monocytes Absolute: 0.4 10*3/uL (ref 0.1–1.0)
Monocytes Relative: 8 %
Neutro Abs: 2.9 10*3/uL (ref 1.7–7.7)
Neutrophils Relative %: 70 %
Platelets: 106 10*3/uL — ABNORMAL LOW (ref 150–400)
RBC: 3.68 MIL/uL — ABNORMAL LOW (ref 3.87–5.11)
RDW: 13.9 % (ref 11.5–15.5)
WBC: 4.2 10*3/uL (ref 4.0–10.5)
nRBC: 0 % (ref 0.0–0.2)

## 2019-06-25 LAB — COMPREHENSIVE METABOLIC PANEL
ALT: 18 U/L (ref 0–44)
AST: 27 U/L (ref 15–41)
Albumin: 3.7 g/dL (ref 3.5–5.0)
Alkaline Phosphatase: 57 U/L (ref 38–126)
Anion gap: 8 (ref 5–15)
BUN: 15 mg/dL (ref 8–23)
CO2: 30 mmol/L (ref 22–32)
Calcium: 8.5 mg/dL — ABNORMAL LOW (ref 8.9–10.3)
Chloride: 104 mmol/L (ref 98–111)
Creatinine, Ser: 0.87 mg/dL (ref 0.44–1.00)
GFR calc Af Amer: >60 mL/min (ref >60)
GFR calc non Af Amer: >60 mL/min (ref >60)
Glucose, Bld: 96 mg/dL (ref 70–99)
Potassium: 3.5 mmol/L (ref 3.5–5.1)
Sodium: 142 mmol/L (ref 135–145)
Total Bilirubin: 0.5 mg/dL (ref 0.3–1.2)
Total Protein: 6.8 g/dL (ref 6.5–8.1)

## 2019-06-25 MED ORDER — SODIUM CHLORIDE 0.9% FLUSH
10.0000 mL | INTRAVENOUS | Status: DC | PRN
  Administered 2019-06-25: 10 mL via INTRAVENOUS
  Filled 2019-06-25: qty 10

## 2019-06-25 MED ORDER — SODIUM CHLORIDE 0.9% FLUSH
10.0000 mL | INTRAVENOUS | Status: DC | PRN
  Filled 2019-06-25: qty 10

## 2019-06-25 MED ORDER — SODIUM CHLORIDE 0.9 % IV SOLN
1200.0000 mg | Freq: Once | INTRAVENOUS | Status: AC
  Administered 2019-06-25: 1200 mg via INTRAVENOUS
  Filled 2019-06-25: qty 20

## 2019-06-25 MED ORDER — HEPARIN SOD (PORK) LOCK FLUSH 100 UNIT/ML IV SOLN
INTRAVENOUS | Status: AC
  Filled 2019-06-25: qty 5

## 2019-06-25 MED ORDER — HEPARIN SOD (PORK) LOCK FLUSH 100 UNIT/ML IV SOLN
500.0000 [IU] | Freq: Once | INTRAVENOUS | Status: AC | PRN
  Administered 2019-06-25: 500 [IU]
  Filled 2019-06-25: qty 5

## 2019-06-25 MED ORDER — SODIUM CHLORIDE 0.9 % IV SOLN
Freq: Once | INTRAVENOUS | Status: AC
  Filled 2019-06-25: qty 250

## 2019-06-25 NOTE — Progress Notes (Signed)
Pt doing ok, her concerns are dry mouth and dry and thin skin.

## 2019-06-25 NOTE — Progress Notes (Signed)
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 She has been referred to [place] for findings of bone metastases and her recent MRI. She has a prior history of 3 packs/day day smoking for over 45 years and quit smoking 5 years ago.She had a CT chest abdomen pelvis in 2018 which showed a 5 mm lung nodule in the left lower lobe. Recently over the last 2 months patient has been having worsening back pain and was referred to orthopedics. 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 Pathologic fracture of S1.  PET scan showed 2 RUL lung nodules, hilar and mediastinal adenopathy and widespread bone mets. MRI brain negative.  Patientcompletedpalliative RT to her spine. Bronchoscopy showed small cell lung cancer. Palliative carboplatin, etoposide and Tecentriq started on 08/18/2018. Scans after 4 cycles showed stable disease. She is on maintenance tecentriq   Interval history-patient reports some symptoms of dry skin and dry mouth.  Denies other complaints at this time.  She has not mentioned about this before but states that this  has been going on for many months  ECOG PS- 1 Pain scale- 0   Review of systems- Review of Systems  Constitutional: Negative for chills, fever, malaise/fatigue and weight loss.  HENT: Negative for congestion, ear discharge and nosebleeds.   Eyes: Negative for blurred vision.  Respiratory: Negative for cough, hemoptysis, sputum production, shortness of breath and wheezing.   Cardiovascular: Negative for chest pain, palpitations, orthopnea and claudication.  Gastrointestinal: Negative for abdominal pain, blood in stool, constipation, diarrhea, heartburn, melena, nausea and vomiting.  Genitourinary: Negative for dysuria, flank pain, frequency, hematuria and urgency.  Musculoskeletal: Negative for back pain, joint pain and myalgias.  Skin: Negative for rash.  Neurological: Negative for dizziness, tingling, focal weakness, seizures, weakness and headaches.  Endo/Heme/Allergies: Does not bruise/bleed easily.  Psychiatric/Behavioral: Negative for depression and suicidal ideas. The patient does not have insomnia.       Allergies  Allergen Reactions  . Bee Venom Swelling     Past Medical History:  Diagnosis Date  . Arthritis   . Cirrhosis ([place])   . Depression   . GERD (gastroesophageal reflux disease)   . Hyperlipidemia   . Hypertension   . Lung cancer ([place])   . Metastatic bone cancer [facility])      Past Surgical History:  Procedure Laterality Date  . APPENDECTOMY  1971  . CHOLECYSTECTOMY  1971  . COLONOSCOPY WITH PROPOFOL N/A 11/15/2016   Procedure: COLONOSCOPY WITH PROPOFOL;  Surgeon: Jonathon Bellows, MD;  Location: [facility] ENDOSCOPY;  Service: Gastroenterology;  Laterality: N/A;  . ENDOBRONCHIAL ULTRASOUND Right 07/30/2018   Procedure: ENDOBRONCHIAL ULTRASOUND;  Surgeon: Laverle Hobby, MD;  Location: [facility] ORS;  Service: Pulmonary;  Laterality: Right;  .  ESOPHAGOGASTRODUODENOSCOPY (EGD) WITH PROPOFOL N/A 01/07/2017   Procedure: ESOPHAGOGASTRODUODENOSCOPY (EGD) WITH PROPOFOL;   Surgeon: Jonathon Bellows, MD;  Location: [facility] ENDOSCOPY;  Service: Gastroenterology;  Laterality: N/A;  . LAPAROSCOPY N/A 03/01/2017   Procedure: LAPAROSCOPY DIAGNOSTIC;  Surgeon: Robert Bellow, MD;  Location: [facility] ORS;  Service: General;  Laterality: N/A;  . PORTA CATH INSERTION N/A 08/14/2018   Procedure: PORTA CATH INSERTION;  Surgeon: Algernon Huxley, MD;  Location: [place] CV LAB;  Service: Cardiovascular;  Laterality: N/A;  . TONSILECTOMY, ADENOIDECTOMY, BILATERAL MYRINGOTOMY [place]  . TONSILLECTOMY    . VENTRAL HERNIA REPAIR N/A 03/01/2017   10 x 14 CM Ventralight ST mesh, intraperitoneal location.   . VENTRAL HERNIA REPAIR N/A 03/01/2017   Procedure: HERNIA REPAIR VENTRAL ADULT;  Surgeon: Robert Bellow, MD;  Location: [facility] ORS;  Service: General;  Laterality: N/A;    Social History   Socioeconomic History  . Marital status: Married    Spouse name: [name]  . Number of children: Not on file  . Years of education: Not on file  . Highest education level: Not on file  Occupational History  . Not on file  Tobacco Use  . Smoking status: Former Smoker    Packs/day: 2.00    Years: 50.00    Pack years: 100.00    Types: Cigarettes, E-cigarettes    Quit date: 02/07/2012    Years since quitting: 7.3  . Smokeless tobacco: Never Used  Substance and Sexual Activity  . Alcohol use: No    Alcohol/week: 0.0 standard drinks  . Drug use: No  . Sexual activity: Yes  Other Topics Concern  . Not on file  Social History Narrative   Married   Retired   Clinical cytogeneticist level of education    No children    1 cup of coffee   Social Determinants of Radio broadcast assistant Strain:   . Difficulty of Paying Living Expenses:   Food Insecurity:   . Worried About Charity fundraiser in the Last Year:   . Arboriculturist in the Last Year:   Transportation Needs:   . Film/video editor (Medical):   Marland Kitchen Lack of Transportation (Non-Medical):   Physical Activity: Unknown    . Days of Exercise per Week: Patient refused  . Minutes of Exercise per Session: Patient refused  Stress:   . Feeling of Stress :   Social Connections: Unknown  . Frequency of Communication with Friends and Family: Patient refused  . Frequency of Social Gatherings with Friends and Family: Patient refused  . Attends Religious Services: Patient refused  . Active Member of Clubs or Organizations: Patient refused  . Attends Archivist Meetings: Patient refused  . Marital Status: Patient refused  Intimate Partner Violence:   . Fear of Current or Ex-Partner:   . Emotionally Abused:   Marland Kitchen Physically Abused:   . Sexually Abused:     Family History  Problem Relation Age of Onset  . Hypertension Mother   . Ovarian cancer Mother 53  . Heart disease Father   . Stroke Father   . Ovarian cancer Sister        ? dx cancer had hyst.  . Breast cancer Neg Hx      Current Outpatient Medications:  .  escitalopram (LEXAPRO) 10 MG tablet, Take 1 tablet by mouth once daily, Disp: 90 tablet, Rfl: 0 .  folic acid (FOLVITE) 1 MG tablet, Take 1 tablet (1  mg total) by mouth daily., Disp: 30 tablet, Rfl: 3 .  ibuprofen (ADVIL,MOTRIN) 200 MG tablet, Take 400 mg by mouth every 8 (eight) hours as needed for headache or moderate pain. , Disp: , Rfl:  .  levothyroxine (SYNTHROID) 150 MCG tablet, Take 1 tablet (150 mcg total) by mouth daily before breakfast., Disp: 30 tablet, Rfl: 3 .  loperamide (IMODIUM) 2 MG capsule, Take 1 capsule (2 mg total) by mouth every 2 (two) hours as needed for diarrhea or loose stools. Do not exceed 16mg  daily., Disp: 30 capsule, Rfl: 1 .  Multiple Vitamins-Minerals (PRESERVISION AREDS 2) CAPS, Take 1 capsule by mouth 2 (two) times a day. , Disp: , Rfl:  .  polyethylene glycol (MIRALAX / GLYCOLAX) 17 g packet, Take 17 g by mouth daily as needed for mild constipation., Disp: 14 each, Rfl: 0 .  rosuvastatin (CRESTOR) 20 MG tablet, Take 1 tablet by mouth once daily, Disp: 90  tablet, Rfl: 0 .  buPROPion (WELLBUTRIN XL) 300 MG 24 hr tablet, Take 1 tablet by mouth once daily (Patient not taking: Reported on 04/22/2019), Disp: 90 tablet, Rfl: 0 .  lidocaine-prilocaine (EMLA) cream, Apply to affected area once (Patient not taking: Reported on 06/25/2019), Disp: 30 g, Rfl: 3 No current facility-administered medications for this visit.  Facility-Administered Medications Ordered in Other Visits:  .  denosumab (XGEVA) injection 120 mg, 120 mg, Subcutaneous, Q30 days, Sindy Guadeloupe, MD, 120 mg at 03/05/19 1525 .  sodium chloride flush (NS) 0.9 % injection 10 mL, 10 mL, Intracatheter, PRN, Sindy Guadeloupe, MD  Physical exam:  Vitals:   06/25/19 0926  BP: 134/74  Pulse: 75  Resp: 16  Temp: 98.2 F (36.8 C)  TempSrc: Oral  Weight: 189 lb (85.7 kg)  Height: 5\' 6"  (1.676 m)   Physical Exam Constitutional:      General: She is not in acute distress. Cardiovascular:     Rate and Rhythm: Normal rate and regular rhythm.     Heart sounds: Normal heart sounds.  Pulmonary:     Effort: Pulmonary effort is normal.     Breath sounds: Normal breath sounds.  Abdominal:     General: Bowel sounds are normal.     Palpations: Abdomen is soft.  Skin:    General: Skin is warm and dry.  Neurological:     Mental Status: She is alert and oriented to person, place, and time.      CMP Latest Ref Rng & Units 06/25/2019  Glucose 70 - 99 mg/dL 96  BUN 8 - 23 mg/dL 15  Creatinine 0.44 - 1.00 mg/dL 0.87  Sodium 135 - 145 mmol/L 142  Potassium 3.5 - 5.1 mmol/L 3.5  Chloride 98 - 111 mmol/L 104  CO2 22 - 32 mmol/L 30  Calcium 8.9 - 10.3 mg/dL 8.5(L)  Total Protein 6.5 - 8.1 g/dL 6.8  Total Bilirubin 0.3 - 1.2 mg/dL 0.5  Alkaline Phos 38 - 126 U/L 57  AST 15 - 41 U/L 27  ALT 0 - 44 U/L 18   CBC Latest Ref Rng & Units 06/25/2019  WBC 4.0 - 10.5 K/uL 4.2  Hemoglobin 12.0 - 15.0 g/dL 11.0(L)  Hematocrit 36.0 - 46.0 % 34.2(L)  Platelets 150 - 400 K/uL 106(L)    No images are  attached to the encounter.  CT Chest W Contrast  Result Date: 06/01/2019 CLINICAL DATA:  Metastatic right upper lobe small cell lung cancer restaging EXAM: CT CHEST, ABDOMEN, AND PELVIS WITH CONTRAST TECHNIQUE:  Multidetector CT imaging of the chest, abdomen and pelvis was performed following the standard protocol during bolus administration of intravenous contrast. CONTRAST:  139mL OMNIPAQUE IOHEXOL 300 MG/ML  SOLN COMPARISON:  02/10/2019 FINDINGS: CT CHEST FINDINGS Cardiovascular: Right Port-A-Cath tip: SVC. Coronary, aortic arch, and branch vessel atherosclerotic vascular disease. Mediastinum/Nodes: Unremarkable Lungs/Pleura: Right upper lobe nodule posteriorly measures 0.5 by 0.3 cm on image 42/4, stable by my measurements. Minimal adjacent subpleural nodularity. Musculoskeletal: Stable distribution of osseous metastatic disease in the thorax, notable in the ribs and thoracic spine. Stable superior and inferior endplate compressions at the T2 level. CT ABDOMEN PELVIS FINDINGS Hepatobiliary: Cirrhosis. Cholecystectomy. No significant focal liver lesion identified. Pancreas: Unremarkable Spleen: Unremarkable Adrenals/Urinary Tract: Small hypodense left renal lesions are technically too small to characterize but statistically likely to be cysts, and unchanged from prior. The adrenal glands appear normal. Stomach/Bowel: Transverse duodenal diverticulum. Otherwise unremarkable. Vascular/Lymphatic: Aortoiliac atherosclerotic vascular disease. 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 No pathologic adenopathy. Reproductive: Unremarkable Other: No supplemental non-categorized findings. Musculoskeletal: Stable sclerotic lesions in the bony pelvis and lumbar spine, with similar compressions noted at L1, L2, L3, L4, and S1. Stable grade 1 anterolisthesis at L4-5 with stable grade 1 retrolisthesis at L2-3 and L3-4. Stable posterior bony retropulsion related to the compression fracture at L2.  IMPRESSION: 1. Stable distribution of osseous metastatic disease in the thorax, abdomen, and pelvis. 2. Stable 5 by 3 mm right upper lobe pulmonary nodule. 3. Other imaging findings of potential clinical significance: Coronary atherosclerosis. Cirrhosis. 4. 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 Ectatic abdominal aorta at risk for aneurysm development. Recommend followup by ultrasound in 5 years. This recommendation follows ACR consensus guidelines: White Paper of the ACR Incidental Findings Committee II on Vascular Findings. J Am Coll Radiol 2013; 48:546-270. 5. Aortic atherosclerosis. Aortic Atherosclerosis (ICD10-I70.0). Electronically Signed   By: Van Clines M.D.   On: 06/01/2019 14:06   NM Bone Scan Whole Body  Result Date: 06/03/2019 CLINICAL DATA:  Small cell lung cancer. EXAM: NUCLEAR MEDICINE WHOLE BODY BONE SCAN TECHNIQUE: Whole body anterior and posterior images were obtained approximately 3 hours after intravenous injection of radiopharmaceutical. RADIOPHARMACEUTICALS:  20.19 mCi Technetium-54m MDP IV COMPARISON:  February 10, 2019 FINDINGS: Multiple small foci of increased tracer uptake are seen throughout the thoracolumbar spine. The degree of tracer uptake seen within these regions is decreased in severity when compared to the prior study. Multiple small focal areas of increased uptake are again seen involving the bilateral humerus, bilateral femurs and multiple bilateral ribs. These areas are predominantly stable in appearance. Increased tracer uptake within the regions near the bilateral sacroiliac joints is again seen. Physiologic tracer uptake is seen within the bilateral kidneys with suspected tracer activity noted within the urinary bladder. IMPRESSION: Predominant stable scintigraphic appearance consistent with diffuse osseous metastasis. Electronically Signed   By: Virgina Norfolk M.D.   On: 06/03/2019 20:23   CT ABDOMEN PELVIS W CONTRAST  Result Date:  06/01/2019 CLINICAL DATA:  Metastatic right upper lobe small cell lung cancer restaging EXAM: CT CHEST, ABDOMEN, AND PELVIS WITH CONTRAST TECHNIQUE: Multidetector CT imaging of the chest, abdomen and pelvis was performed following the standard protocol during bolus administration of intravenous contrast. CONTRAST:  12mL OMNIPAQUE IOHEXOL 300 MG/ML  SOLN COMPARISON:  02/10/2019 FINDINGS: CT CHEST FINDINGS Cardiovascular: Right Port-A-Cath tip: SVC. Coronary, aortic arch, and branch vessel atherosclerotic vascular disease. Mediastinum/Nodes: Unremarkable Lungs/Pleura: Right upper lobe nodule posteriorly measures 0.5 by 0.3 cm  on image 42/4, stable by my measurements. Minimal adjacent subpleural nodularity. Musculoskeletal: Stable distribution of osseous metastatic disease in the thorax, notable in the ribs and thoracic spine. Stable superior and inferior endplate compressions at the T2 level. CT ABDOMEN PELVIS FINDINGS Hepatobiliary: Cirrhosis. Cholecystectomy. No significant focal liver lesion identified. Pancreas: Unremarkable Spleen: Unremarkable Adrenals/Urinary Tract: Small hypodense left renal lesions are technically too small to characterize but statistically likely to be cysts, and unchanged from prior. The adrenal glands appear normal. Stomach/Bowel: Transverse duodenal diverticulum. Otherwise unremarkable. Vascular/Lymphatic: Aortoiliac atherosclerotic vascular disease. 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 No pathologic adenopathy. Reproductive: Unremarkable Other: No supplemental non-categorized findings. Musculoskeletal: Stable sclerotic lesions in the bony pelvis and lumbar spine, with similar compressions noted at L1, L2, L3, L4, and S1. Stable grade 1 anterolisthesis at L4-5 with stable grade 1 retrolisthesis at L2-3 and L3-4. Stable posterior bony retropulsion related to the compression fracture at L2. IMPRESSION: 1. Stable distribution of osseous metastatic disease in the  thorax, abdomen, and pelvis. 2. Stable 5 by 3 mm right upper lobe pulmonary nodule. 3. Other imaging findings of potential clinical significance: Coronary atherosclerosis. Cirrhosis. 4. 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 Ectatic abdominal aorta at risk for aneurysm development. Recommend followup by ultrasound in 5 years. This recommendation follows ACR consensus guidelines: White Paper of the ACR Incidental Findings Committee II on Vascular Findings. J Am Coll Radiol 2013; 84:696-295. 5. Aortic atherosclerosis. Aortic Atherosclerosis (ICD10-I70.0). Electronically Signed   By: Van Clines M.D.   On: 06/01/2019 14:06     Assessment and plan- Patient is a 69 y.o. female with extensive stage small cell lung cancer and bone metastases.    She is here for on treatment assessment prior to cycle 11 of maintenance Tecentriq    Counts okay to proceed with cycle 11 of maintenance Tecentriq today.  I will see her back in 3 weeks for cycle 12.  Plans to continue Tecentriq until progression or toxicity.   She has had ongoing thrombocytopenia at least since October 2020.  It is possible to get autoimmune thrombocytopenia with Tecentriq.  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  She is currently taking 150 mcg of levothyroxine and her last TSH Was improved at 5 was compared to a prior value of 54.  Bone metastases: Xgeva/Zometa is on hold.  She did develop significant hypocalcemia after receiving Xgeva in the past.  .Visit Diagnosis 1. Encounter for antineoplastic immunotherapy   2. Small cell lung cancer, right upper lobe ([place])   3. Bone metastases ([place])   4. Thrombocytopenia ([place])      Dr. Randa Evens, MD, MPH [facility] [phone] 06/25/2019 2:59 PM

## 2019-07-09 NOTE — Progress Notes (Signed)

## 2019-07-15 ENCOUNTER — Other Ambulatory Visit: Payer: Self-pay

## 2019-07-15 DIAGNOSIS — E039 Hypothyroidism, unspecified: Secondary | ICD-10-CM | POA: Diagnosis not present

## 2019-07-16 ENCOUNTER — Inpatient Hospital Stay: Payer: PPO | Attending: Oncology

## 2019-07-16 ENCOUNTER — Inpatient Hospital Stay: Payer: PPO

## 2019-07-16 ENCOUNTER — Inpatient Hospital Stay (HOSPITAL_BASED_OUTPATIENT_CLINIC_OR_DEPARTMENT_OTHER): Payer: PPO | Admitting: Oncology

## 2019-07-16 ENCOUNTER — Other Ambulatory Visit: Payer: Self-pay

## 2019-07-16 ENCOUNTER — Encounter: Payer: Self-pay | Admitting: Oncology

## 2019-07-16 VITALS — BP 153/93 | HR 78 | Temp 97.0°F | Wt 186.4 lb

## 2019-07-16 VITALS — BP 141/72 | HR 72 | Resp 16

## 2019-07-16 DIAGNOSIS — Z5112 Encounter for antineoplastic immunotherapy: Secondary | ICD-10-CM | POA: Diagnosis not present

## 2019-07-16 DIAGNOSIS — E063 Autoimmune thyroiditis: Secondary | ICD-10-CM | POA: Diagnosis not present

## 2019-07-16 DIAGNOSIS — Z803 Family history of malignant neoplasm of breast: Secondary | ICD-10-CM | POA: Diagnosis not present

## 2019-07-16 DIAGNOSIS — C3411 Malignant neoplasm of upper lobe, right bronchus or lung: Secondary | ICD-10-CM

## 2019-07-16 DIAGNOSIS — Z87891 Personal history of nicotine dependence: Secondary | ICD-10-CM | POA: Diagnosis not present

## 2019-07-16 DIAGNOSIS — C3432 Malignant neoplasm of lower lobe, left bronchus or lung: Secondary | ICD-10-CM | POA: Diagnosis not present

## 2019-07-16 DIAGNOSIS — Z8041 Family history of malignant neoplasm of ovary: Secondary | ICD-10-CM | POA: Insufficient documentation

## 2019-07-16 DIAGNOSIS — C7951 Secondary malignant neoplasm of bone: Secondary | ICD-10-CM

## 2019-07-16 DIAGNOSIS — Z923 Personal history of irradiation: Secondary | ICD-10-CM | POA: Diagnosis not present

## 2019-07-16 LAB — COMPREHENSIVE METABOLIC PANEL
ALT: 19 U/L (ref 0–44)
AST: 26 U/L (ref 15–41)
Albumin: 3.9 g/dL (ref 3.5–5.0)
Alkaline Phosphatase: 53 U/L (ref 38–126)
Anion gap: 10 (ref 5–15)
BUN: 20 mg/dL (ref 8–23)
CO2: 29 mmol/L (ref 22–32)
Calcium: 8.9 mg/dL (ref 8.9–10.3)
Chloride: 102 mmol/L (ref 98–111)
Creatinine, Ser: 0.85 mg/dL (ref 0.44–1.00)
GFR calc Af Amer: >60 mL/min (ref >60)
GFR calc non Af Amer: >60 mL/min (ref >60)
Glucose, Bld: 92 mg/dL (ref 70–99)
Potassium: 3.8 mmol/L (ref 3.5–5.1)
Sodium: 141 mmol/L (ref 135–145)
Total Bilirubin: 0.6 mg/dL (ref 0.3–1.2)
Total Protein: 7.3 g/dL (ref 6.5–8.1)

## 2019-07-16 LAB — CBC WITH DIFFERENTIAL/PLATELET
Abs Immature Granulocytes: 0.01 10*3/uL (ref 0.00–0.07)
Basophils Absolute: 0 10*3/uL (ref 0.0–0.1)
Basophils Relative: 1 %
Eosinophils Absolute: 0.2 10*3/uL (ref 0.0–0.5)
Eosinophils Relative: 5 %
HCT: 37.2 % (ref 36.0–46.0)
Hemoglobin: 11.9 g/dL — ABNORMAL LOW (ref 12.0–15.0)
Immature Granulocytes: 0 %
Lymphocytes Relative: 18 %
Lymphs Abs: 0.8 10*3/uL (ref 0.7–4.0)
MCH: 29.1 pg (ref 26.0–34.0)
MCHC: 32 g/dL (ref 30.0–36.0)
MCV: 91 fL (ref 80.0–100.0)
Monocytes Absolute: 0.4 10*3/uL (ref 0.1–1.0)
Monocytes Relative: 8 %
Neutro Abs: 3.1 10*3/uL (ref 1.7–7.7)
Neutrophils Relative %: 68 %
Platelets: 135 10*3/uL — ABNORMAL LOW (ref 150–400)
RBC: 4.09 MIL/uL (ref 3.87–5.11)
RDW: 14.4 % (ref 11.5–15.5)
WBC: 4.5 10*3/uL (ref 4.0–10.5)
nRBC: 0 % (ref 0.0–0.2)

## 2019-07-16 MED ORDER — SODIUM CHLORIDE 0.9 % IV SOLN
1200.0000 mg | Freq: Once | INTRAVENOUS | Status: AC
  Administered 2019-07-16: 1200 mg via INTRAVENOUS
  Filled 2019-07-16: qty 20

## 2019-07-16 MED ORDER — HEPARIN SOD (PORK) LOCK FLUSH 100 UNIT/ML IV SOLN
500.0000 [IU] | Freq: Once | INTRAVENOUS | Status: AC
  Administered 2019-07-16: 500 [IU] via INTRAVENOUS
  Filled 2019-07-16: qty 5

## 2019-07-16 MED ORDER — SODIUM CHLORIDE 0.9% FLUSH
10.0000 mL | Freq: Once | INTRAVENOUS | Status: AC
  Administered 2019-07-16: 10 mL via INTRAVENOUS
  Filled 2019-07-16: qty 10

## 2019-07-16 MED ORDER — SODIUM CHLORIDE 0.9 % IV SOLN
Freq: Once | INTRAVENOUS | Status: AC
  Filled 2019-07-16: qty 250

## 2019-07-16 MED ORDER — HEPARIN SOD (PORK) LOCK FLUSH 100 UNIT/ML IV SOLN
INTRAVENOUS | Status: AC
  Filled 2019-07-16: qty 5

## 2019-07-16 NOTE — Progress Notes (Signed)
Patient denies any concerns today. States she is having a little pain in left arm. Rates pain at 2

## 2019-07-17 NOTE — Progress Notes (Signed)
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 She has been referred to [place] for findings of bone metastases and her recent MRI. She has a prior history of 3 packs/day day smoking for over 45 years and quit smoking 5 years ago.She had a CT chest abdomen pelvis in 2018 which showed a 5 mm lung nodule in the left lower lobe. Recently over the last 2 months patient has been having worsening back pain and was referred to orthopedics. 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 Pathologic fracture of S1.  PET scan showed 2 RUL lung nodules, hilar and mediastinal adenopathy and widespread bone mets. MRI brain negative.  Patientcompletedpalliative RT to her spine. Bronchoscopy showed small cell lung cancer. Palliative carboplatin, etoposide and Tecentriq started on 08/18/2018. Scans after 4 cycles showed stable disease. She is on maintenance tecentriq  Interval history-patient reports doing well.  Low back pain is well controlled.  Denies any complaints at this time  ECOG PS- 1 Pain scale- 0   Review of systems- Review of  Systems  Constitutional: Negative for chills, fever, malaise/fatigue and weight loss.  HENT: Negative for congestion, ear discharge and nosebleeds.   Eyes: Negative for blurred vision.  Respiratory: Negative for cough, hemoptysis, sputum production, shortness of breath and wheezing.   Cardiovascular: Negative for chest pain, palpitations, orthopnea and claudication.  Gastrointestinal: Negative for abdominal pain, blood in stool, constipation, diarrhea, heartburn, melena, nausea and vomiting.  Genitourinary: Negative for dysuria, flank pain, frequency, hematuria and urgency.  Musculoskeletal: Negative for back pain, joint pain and myalgias.  Skin: Negative for rash.  Neurological: Negative for dizziness, tingling, focal weakness, seizures, weakness and headaches.  Endo/Heme/Allergies: Does not bruise/bleed easily.  Psychiatric/Behavioral: Negative for depression and suicidal ideas. The patient does not have insomnia.       Allergies  Allergen Reactions  . Bee Venom Swelling     Past Medical History:  Diagnosis Date  . Arthritis   . Cirrhosis ([place])   . Depression   . GERD (gastroesophageal reflux disease)   . Hyperlipidemia   . Hypertension   . Lung cancer ([place])   . Metastatic bone cancer [facility])      Past Surgical History:  Procedure Laterality Date  . APPENDECTOMY  1971  . CHOLECYSTECTOMY  1971  . COLONOSCOPY WITH PROPOFOL N/A 11/15/2016   Procedure: COLONOSCOPY WITH PROPOFOL;  Surgeon: Jonathon Bellows, MD;  Location: [facility] ENDOSCOPY;  Service: Gastroenterology;  Laterality: N/A;  . ENDOBRONCHIAL ULTRASOUND Right 07/30/2018   Procedure: ENDOBRONCHIAL ULTRASOUND;  Surgeon: Laverle Hobby, MD;  Location: [facility] ORS;  Service: Pulmonary;  Laterality: Right;  . ESOPHAGOGASTRODUODENOSCOPY (EGD) WITH PROPOFOL N/A 01/07/2017   Procedure: ESOPHAGOGASTRODUODENOSCOPY (EGD) WITH PROPOFOL;  Surgeon: Jonathon Bellows, MD;  Location: [facility] ENDOSCOPY;  Service: Gastroenterology;  Laterality: N/A;    . LAPAROSCOPY N/A 03/01/2017   Procedure: LAPAROSCOPY DIAGNOSTIC;  Surgeon: Robert Bellow, MD;  Location: [facility] ORS;  Service: General;  Laterality: N/A;  . PORTA CATH INSERTION N/A 08/14/2018   Procedure: PORTA CATH INSERTION;  Surgeon: Algernon Huxley, MD;  Location: [place] CV LAB;  Service: Cardiovascular;  Laterality: N/A;  . TONSILECTOMY, ADENOIDECTOMY, BILATERAL MYRINGOTOMY [place]  . TONSILLECTOMY    . VENTRAL HERNIA REPAIR N/A 03/01/2017   10 x 14 CM Ventralight ST mesh, intraperitoneal location.   . VENTRAL HERNIA REPAIR N/A 03/01/2017   Procedure: HERNIA REPAIR VENTRAL ADULT;  Surgeon: Robert Bellow, MD;  Location: [facility] ORS;  Service: General;  Laterality: N/A;    Social History   Socioeconomic History  . Marital status: Married    Spouse name: [name]  . Number of children: Not on file  . Years of education: Not on file  . Highest education level: Not on file  Occupational History  . Not on file  Tobacco Use  . Smoking status: Former Smoker    Packs/day: 2.00    Years: 50.00    Pack years: 100.00    Types: Cigarettes, E-cigarettes    Quit date: 02/07/2012    Years since quitting: 7.4  . Smokeless tobacco: Never Used  Vaping Use  . Vaping Use: Every day  . Start date: 10/06/2013  . Devices: uses no liquid  Substance and Sexual Activity  . Alcohol use: No    Alcohol/week: 0.0 standard drinks  . Drug use: No  . Sexual activity: Yes  Other Topics Concern  . Not on file  Social History Narrative   Married   Retired   Clinical cytogeneticist level of education    No children    1 cup of coffee   Social Determinants of Radio broadcast assistant Strain:   . Difficulty of Paying Living Expenses:   Food Insecurity:   . Worried About Charity fundraiser in the Last Year:   . Arboriculturist in the Last Year:   Transportation Needs:   . Film/video editor (Medical):   Marland Kitchen Lack of Transportation (Non-Medical):   Physical Activity: Unknown  . Days  of Exercise per Week: Patient refused  . Minutes of Exercise per Session: Patient refused  Stress:   . Feeling of Stress :   Social Connections: Unknown  . Frequency of Communication with Friends and Family: Patient refused  . Frequency of Social Gatherings with Friends and Family: Patient refused  . Attends Religious Services: Patient refused  . Active Member of Clubs or Organizations: Patient refused  . Attends Archivist Meetings: Patient refused  . Marital Status: Patient refused  Intimate Partner Violence:   . Fear of Current or Ex-Partner:   . Emotionally Abused:   Marland Kitchen Physically Abused:   . Sexually Abused:     Family History  Problem Relation Age of Onset  . Hypertension Mother   . Ovarian cancer Mother 7  . Heart disease Father   . Stroke Father   . Ovarian cancer Sister        ? dx cancer had hyst.  . Breast cancer Neg Hx      Current Outpatient Medications:  .  escitalopram (LEXAPRO) 10 MG tablet, Take 1 tablet by mouth once daily, Disp: 90 tablet, Rfl: 0 .  folic acid (FOLVITE) 1 MG tablet, Take 1 tablet (  1 mg total) by mouth daily., Disp: 30 tablet, Rfl: 3 .  ibuprofen (ADVIL,MOTRIN) 200 MG tablet, Take 400 mg by mouth every 8 (eight) hours as needed for headache or moderate pain. , Disp: , Rfl:  .  levothyroxine (SYNTHROID) 150 MCG tablet, Take 1 tablet (150 mcg total) by mouth daily before breakfast., Disp: 30 tablet, Rfl: 3 .  loperamide (IMODIUM) 2 MG capsule, Take 1 capsule (2 mg total) by mouth every 2 (two) hours as needed for diarrhea or loose stools. Do not exceed 16mg  daily., Disp: 30 capsule, Rfl: 1 .  Multiple Vitamins-Minerals (PRESERVISION AREDS 2) CAPS, Take 1 capsule by mouth 2 (two) times a day. , Disp: , Rfl:  .  polyethylene glycol (MIRALAX / GLYCOLAX) 17 g packet, Take 17 g by mouth daily as needed for mild constipation., Disp: 14 each, Rfl: 0 .  rosuvastatin (CRESTOR) 20 MG tablet, Take 1 tablet by mouth once daily, Disp: 90 tablet,  Rfl: 0 .  buPROPion (WELLBUTRIN XL) 300 MG 24 hr tablet, Take 1 tablet by mouth once daily (Patient not taking: Reported on 04/22/2019), Disp: 90 tablet, Rfl: 0 .  lidocaine-prilocaine (EMLA) cream, Apply to affected area once (Patient not taking: Reported on 06/25/2019), Disp: 30 g, Rfl: 3 No current facility-administered medications for this visit.  Facility-Administered Medications Ordered in Other Visits:  .  denosumab (XGEVA) injection 120 mg, 120 mg, Subcutaneous, Q30 days, Sindy Guadeloupe, MD, 120 mg at 03/05/19 1525  Physical exam:  Vitals:   07/16/19 0905  BP: (!) 153/93  Pulse: 78  Temp: (!) 97 F (36.1 C)  TempSrc: Tympanic  SpO2: 95%  Weight: 186 lb 6.4 oz (84.6 kg)   Physical Exam Constitutional:      General: She is not in acute distress. Cardiovascular:     Rate and Rhythm: Normal rate and regular rhythm.     Heart sounds: Normal heart sounds.  Pulmonary:     Effort: Pulmonary effort is normal.     Breath sounds: Normal breath sounds.  Abdominal:     General: Bowel sounds are normal.     Palpations: Abdomen is soft.  Skin:    General: Skin is warm and dry.  Neurological:     Mental Status: She is alert and oriented to person, place, and time.      CMP Latest Ref Rng & Units 07/16/2019  Glucose 70 - 99 mg/dL 92  BUN 8 - 23 mg/dL 20  Creatinine 0.44 - 1.00 mg/dL 0.85  Sodium 135 - 145 mmol/L 141  Potassium 3.5 - 5.1 mmol/L 3.8  Chloride 98 - 111 mmol/L 102  CO2 22 - 32 mmol/L 29  Calcium 8.9 - 10.3 mg/dL 8.9  Total Protein 6.5 - 8.1 g/dL 7.3  Total Bilirubin 0.3 - 1.2 mg/dL 0.6  Alkaline Phos 38 - 126 U/L 53  AST 15 - 41 U/L 26  ALT 0 - 44 U/L 19   CBC Latest Ref Rng & Units 07/16/2019  WBC 4.0 - 10.5 K/uL 4.5  Hemoglobin 12.0 - 15.0 g/dL 11.9(L)  Hematocrit 36 - 46 % 37.2  Platelets 150 - 400 K/uL 135(L)     Assessment and plan- Patient is a 69 y.o. female with extensive stage small cell lung cancer and bone metastases.   She is here for on  treatment assessment prior to cycle 12 of maintenance Tecentriq  Counts okay to proceed with cycle 12 of maintenance Tecentriq today.  I will see her back in 3 weeks  for cycle 13.She will be due for repeat scans end of next month.  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  She was recently seen by Dr. Elisabeth Cara had a repeat TSH done which was still elevated at 15.  Dr. Gabriel Carina is going to confirm medication compliance with her before making any further changes  Bone metastases: Xgeva/Zometa on hold given significant hypocalcemia that she developed following the infusion.  Will await for her calcium levels to go about 9 before considering bisphosphonates again   Visit Diagnosis 1. Encounter for antineoplastic immunotherapy   2. Small cell lung cancer, right upper lobe ([place])   3. Bone metastases ([place])      Dr. Randa Evens, MD, MPH [facility] [phone] 07/17/2019 2:39 PM

## 2019-07-22 ENCOUNTER — Other Ambulatory Visit: Payer: Self-pay

## 2019-07-22 ENCOUNTER — Ambulatory Visit (INDEPENDENT_AMBULATORY_CARE_PROVIDER_SITE_OTHER): Payer: PPO | Admitting: Family Medicine

## 2019-07-22 ENCOUNTER — Encounter: Payer: Self-pay | Admitting: Family Medicine

## 2019-07-22 DIAGNOSIS — E032 Hypothyroidism due to medicaments and other exogenous substances: Secondary | ICD-10-CM

## 2019-07-22 DIAGNOSIS — E785 Hyperlipidemia, unspecified: Secondary | ICD-10-CM

## 2019-07-22 DIAGNOSIS — F331 Major depressive disorder, recurrent, moderate: Secondary | ICD-10-CM

## 2019-07-22 DIAGNOSIS — T148XXA Other injury of unspecified body region, initial encounter: Secondary | ICD-10-CM

## 2019-07-22 DIAGNOSIS — M25512 Pain in left shoulder: Secondary | ICD-10-CM

## 2019-07-22 LAB — LIPID PANEL
Cholesterol: 171 mg/dL (ref 0–200)
HDL: 45.4 mg/dL (ref >39.00)
NonHDL: 126.06
Total CHOL/HDL Ratio: 4
Triglycerides: 281 mg/dL — ABNORMAL HIGH (ref 0.0–149.0)
VLDL: 56.2 mg/dL — ABNORMAL HIGH (ref 0.0–40.0)

## 2019-07-22 LAB — LDL CHOLESTEROL, DIRECT: Direct LDL: 95 mg/dL

## 2019-07-22 NOTE — Assessment & Plan Note (Signed)
Stable. continue lexapro.

## 2019-07-22 NOTE — Assessment & Plan Note (Signed)
Continue Crestor. Check lipid panel.

## 2019-07-22 NOTE — Assessment & Plan Note (Signed)
Rotator cuff impingement versus arthritis.  Discussed that we could do an x-ray now though she opted to defer and try exercises first.  She will let us know if not improving in the next 2 weeks and then we would get an x-ray.

## 2019-07-22 NOTE — Assessment & Plan Note (Signed)
Likely related to slightly low platelets vs trauma to her limbs. She will monitor. Discussed if she started bruising elsewhere to let us know right away.

## 2019-07-22 NOTE — Patient Instructions (Addendum)
Nice to see you. Please take your Synthroid 30 minutes prior to eating in the morning.  Please also take it at least 4 hours prior to taking your multivitamin.  Please contact your endocrinology office to follow-up with them on this so they can schedule labs for 4-5 weeks.   Shoulder Impingement Syndrome Rehab Ask your health care provider which exercises are safe for you. Do exercises exactly as told by your health care provider and adjust them as directed. It is normal to feel mild stretching, pulling, tightness, or discomfort as you do these exercises. Stop right away if you feel sudden pain or your pain gets worse. Do not begin these exercises until told by your health care provider. Stretching and range-of-motion exercise This exercise warms up your muscles and joints and improves the movement and flexibility of your shoulder. This exercise also helps to relieve pain and stiffness. Passive horizontal adduction In passive adduction, you use your other hand to move the injured arm toward your body. The injured arm does not move on its own. In this movement, your arm is moved across your body in the horizontal plane (horizontal adduction). 1. Sit or stand and pull your left / right elbow across your chest, toward your other shoulder. Stop when you feel a gentle stretch in the back of your shoulder and upper arm. ? Keep your arm at shoulder height. ? Keep your arm as close to your body as you comfortably can. 2. Hold for __________ seconds. 3. Slowly return to the starting position. Repeat __________ times. Complete this exercise __________ times a day. Strengthening exercises These exercises build strength and endurance in your shoulder. Endurance is the ability to use your muscles for a long time, even after they get tired. External rotation, isometric This is an exercise in which you press the back of your wrist against a door frame without moving your shoulder joint (isometric). 1. Stand or  sit in a doorway, facing the door frame. 2. Bend your left / right elbow and place the back of your wrist against the door frame. Only the back of your wrist should be touching the frame. Keep your upper arm at your side. 3. Gently press your wrist against the door frame, as if you are trying to push your arm away from your abdomen (external rotation). Press as hard as you are able without pain. ? Avoid shrugging your shoulder while you press your wrist against the door frame. Keep your shoulder blade tucked down toward the middle of your back. 4. Hold for __________ seconds. 5. Slowly release the tension, and relax your muscles completely before you repeat the exercise. Repeat __________ times. Complete this exercise __________ times a day. Internal rotation, isometric This is an exercise in which you press your palm against a door frame without moving your shoulder joint (isometric). 1. Stand or sit in a doorway, facing the door frame. 2. Bend your left / right elbow and place the palm of your hand against the door frame. Only your palm should be touching the frame. Keep your upper arm at your side. 3. Gently press your hand against the door frame, as if you are trying to push your arm toward your abdomen (internal rotation). Press as hard as you are able without pain. ? Avoid shrugging your shoulder while you press your hand against the door frame. Keep your shoulder blade tucked down toward the middle of your back. 4. Hold for __________ seconds. 5. Slowly release the tension, and  relax your muscles completely before you repeat the exercise. Repeat __________ times. Complete this exercise __________ times a day. Scapular protraction, supine  1. Lie on your back on a firm surface (supine position). Hold a __________ weight in your left / right hand. 2. Raise your left / right arm straight into the air so your hand is directly above your shoulder joint. 3. Push the weight into the air so your  shoulder (scapula) lifts off the surface that you are lying on. The scapula will push up or forward (protraction). Do not move your head, neck, or back. 4. Hold for __________ seconds. 5. Slowly return to the starting position. Let your muscles relax completely before you repeat this exercise. Repeat __________ times. Complete this exercise __________ times a day. Scapular retraction  1. Sit in a stable chair without armrests, or stand up. 2. Secure an exercise band to a stable object in front of you so the band is at shoulder height. 3. Hold one end of the exercise band in each hand. Your palms should face down. 4. Squeeze your shoulder blades together (retraction) and move your elbows slightly behind you. Do not shrug your shoulders upward while you do this. 5. Hold for __________ seconds. 6. Slowly return to the starting position. Repeat __________ times. Complete this exercise __________ times a day. Shoulder extension  1. Sit in a stable chair without armrests, or stand up. 2. Secure an exercise band to a stable object in front of you so the band is above shoulder height. 3. Hold one end of the exercise band in each hand. 4. Straighten your elbows and lift your hands up to shoulder height. 5. Squeeze your shoulder blades together and pull your hands down to the sides of your thighs (extension). Stop when your hands are straight down by your sides. Do not let your hands go behind your body. 6. Hold for __________ seconds. 7. Slowly return to the starting position. Repeat __________ times. Complete this exercise __________ times a day. This information is not intended to replace advice given to you by your health care provider. Make sure you discuss any questions you have with your health care provider. Document Revised: 05/16/2018 Document Reviewed: 02/17/2018 Elsevier Patient Education  [place].

## 2019-07-22 NOTE — Progress Notes (Signed)
Tommi Rumps, MD Phone: [phone]  Anne [name] is a 69 y.o. female who presents today for f/u.  HYPERLIPIDEMIA Symptoms Chest pain on exertion:  no   Medications: Compliance- taking crestor Right upper quadrant pain- no  Muscle aches- no  HYPOTHYROIDISM Disease Monitoring Weight changes: no  Skin Changes: no Heat/Cold intolerance: no  Medication Monitoring Compliance:  Taking synthroid   Last TSH:   Lab Results  Component Value Date   TSH 5.333 (H) 06/04/2019   Bruising: Patient notes occasional bruises on her arms and legs.  No bruising over her thorax or abdomen.  No bleeding issues.  Recent platelets were minimally low.  Depression: Notes she does get somewhat depressed when she is sitting at home though otherwise is doing okay.  Taking Lexapro.  No SI.  Left shoulder pain: This is been going on for a month or 1.5 months.  No specific injury.  Hurts with movements or when she lays on it.  She wonders if it is arthritis.  Social History   Tobacco Use  Smoking Status Former Smoker  . Packs/day: 2.00  . Years: 50.00  . Pack years: 100.00  . Types: Cigarettes, E-cigarettes  . Quit date: 02/07/2012  . Years since quitting: 7.4  Smokeless Tobacco Never Used     ROS see history of present illness  Objective  Physical Exam Vitals:   07/22/19 1325  BP: 125/70  Pulse: 79  Temp: 98.7 F (37.1 C)  SpO2: 93%    BP Readings from Last 3 Encounters:  07/22/19 125/70  07/16/19 (!) 141/72  07/16/19 (!) 153/93   Wt Readings from Last 3 Encounters:  07/22/19 189 lb 9.6 oz (86 kg)  07/16/19 186 lb 6.4 oz (84.6 kg)  06/25/19 189 lb (85.7 kg)    Physical Exam Constitutional:      General: She is not in acute distress.    Appearance: She is not diaphoretic.  Cardiovascular:     Rate and Rhythm: Normal rate and regular rhythm.     Heart sounds: Normal heart sounds.  Pulmonary:     Effort: Pulmonary effort is normal.     Breath sounds: Normal breath  sounds.  Musculoskeletal:     Right lower leg: No edema.     Left lower leg: No edema.     Comments: Left shoulder with no tenderness, full range of motion, there is discomfort on active and passive internal range of motion, some discomfort on empty can testing on the left, right shoulder with full range of motion with no discomfort actively  Skin:    General: Skin is warm and dry.  Neurological:     Mental Status: She is alert.      Assessment/Plan: Please see individual problem list.  Hypothyroidism Recently above goal.  She will confirm that she is taking 150 mcg of Synthroid daily.  Discussed taking this 30 minutes before eating anything in the morning.  Discussed separating her multivitamin from her Synthroid by 4 hours.  She will have repeat testing through endocrinology in 4 to 5 weeks.  HLD (hyperlipidemia) Continue Crestor. Check lipid panel.   Depression Stable. continue lexapro.   Bruising Likely related to slightly low platelets vs trauma to her limbs. She will monitor. Discussed if she started bruising elsewhere to let us know right away.    Orders Placed This Encounter  Procedures  . Lipid panel    No orders of the defined types were placed in this encounter.   This visit  occurred during the SARS-CoV-2 public health emergency.  Safety protocols were in place, including screening questions prior to the visit, additional usage of staff PPE, and extensive cleaning of exam room while observing appropriate contact time as indicated for disinfecting solutions.    Tommi Rumps, MD [place]

## 2019-07-22 NOTE — Assessment & Plan Note (Signed)
Recently above goal.  She will confirm that she is taking 150 mcg of Synthroid daily.  Discussed taking this 30 minutes before eating anything in the morning.  Discussed separating her multivitamin from her Synthroid by 4 hours.  She will have repeat testing through endocrinology in 4 to 5 weeks.

## 2019-07-29 ENCOUNTER — Other Ambulatory Visit: Payer: Self-pay | Admitting: Family Medicine

## 2019-08-06 ENCOUNTER — Telehealth: Payer: Self-pay | Admitting: Oncology

## 2019-08-06 ENCOUNTER — Inpatient Hospital Stay: Payer: PPO

## 2019-08-06 ENCOUNTER — Inpatient Hospital Stay: Payer: PPO | Admitting: Oncology

## 2019-08-06 NOTE — Telephone Encounter (Signed)
Patient missed appt on this date. Patient later phoned and rescheduled appts for 08-07-19.

## 2019-08-06 NOTE — Progress Notes (Deleted)
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 She has been referred to [place] for findings of bone metastases and her recent MRI. She has a prior history of 3 packs/day day smoking for over 45 years and quit smoking 5 years ago.She had a CT chest abdomen pelvis in 2018 which showed a 5 mm lung nodule in the left lower lobe. Recently over the last 2 months patient has been having worsening back pain and was referred to orthopedics. 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 Pathologic fracture of S1.  PET scan showed 2 RUL lung nodules, hilar and mediastinal adenopathy and widespread bone mets. MRI brain negative.  Patientcompletedpalliative RT to her spine. Bronchoscopy showed small cell lung cancer. Palliative carboplatin, etoposide and Tecentriq started on 08/18/2018. Scans after 4 cycles showed stable disease. She is on maintenance tecentriq  Interval history- ***  ECOG PS- *** Pain scale- *** Opioid associated constipation- ***  Review of systems- Review of Systems  Constitutional: Negative for chills, fever,  malaise/fatigue and weight loss.  HENT: Negative for congestion, ear discharge and nosebleeds.   Eyes: Negative for blurred vision.  Respiratory: Negative for cough, hemoptysis, sputum production, shortness of breath and wheezing.   Cardiovascular: Negative for chest pain, palpitations, orthopnea and claudication.  Gastrointestinal: Negative for abdominal pain, blood in stool, constipation, diarrhea, heartburn, melena, nausea and vomiting.  Genitourinary: Negative for dysuria, flank pain, frequency, hematuria and urgency.  Musculoskeletal: Negative for back pain, joint pain and myalgias.  Skin: Negative for rash.  Neurological: Negative for dizziness, tingling, focal weakness, seizures, weakness and headaches.  Endo/Heme/Allergies: Does not bruise/bleed easily.  Psychiatric/Behavioral: Negative for depression and suicidal ideas. The patient does not have insomnia.       Allergies  Allergen Reactions  . Bee Venom Swelling     Past Medical History:  Diagnosis Date  . Arthritis   . Cirrhosis ([place])   . Depression   . GERD (gastroesophageal reflux disease)   . Hyperlipidemia   . Hypertension   . Lung cancer ([place])   . Metastatic bone cancer [facility])      Past Surgical History:  Procedure Laterality Date  . APPENDECTOMY  1971  . CHOLECYSTECTOMY  1971  . COLONOSCOPY WITH PROPOFOL N/A 11/15/2016   Procedure: COLONOSCOPY WITH PROPOFOL;  Surgeon: Jonathon Bellows, MD;  Location: [facility] ENDOSCOPY;  Service: Gastroenterology;  Laterality: N/A;  . ENDOBRONCHIAL ULTRASOUND Right 07/30/2018   Procedure: ENDOBRONCHIAL ULTRASOUND;  Surgeon: Laverle Hobby, MD;  Location: [facility] ORS;  Service: Pulmonary;  Laterality: Right;  . ESOPHAGOGASTRODUODENOSCOPY (EGD) WITH PROPOFOL N/A 01/07/2017   Procedure: ESOPHAGOGASTRODUODENOSCOPY (EGD) WITH PROPOFOL;  Surgeon: Jonathon Bellows, MD;  Location: [facility] ENDOSCOPY;  Service: Gastroenterology;  Laterality: N/A;  . LAPAROSCOPY  N/A 03/01/2017   Procedure:  LAPAROSCOPY DIAGNOSTIC;  Surgeon: Robert Bellow, MD;  Location: [facility] ORS;  Service: General;  Laterality: N/A;  . PORTA CATH INSERTION N/A 08/14/2018   Procedure: PORTA CATH INSERTION;  Surgeon: Algernon Huxley, MD;  Location: [place] CV LAB;  Service: Cardiovascular;  Laterality: N/A;  . TONSILECTOMY, ADENOIDECTOMY, BILATERAL MYRINGOTOMY [place]  . TONSILLECTOMY    . VENTRAL HERNIA REPAIR N/A 03/01/2017   10 x 14 CM Ventralight ST mesh, intraperitoneal location.   . VENTRAL HERNIA REPAIR N/A 03/01/2017   Procedure: HERNIA REPAIR VENTRAL ADULT;  Surgeon: Robert Bellow, MD;  Location: [facility] ORS;  Service: General;  Laterality: N/A;    Social History   Socioeconomic History  . Marital status: Married    Spouse name: [name]  . Number of children: Not on file  . Years of education: Not on file  . Highest education level: Not on file  Occupational History  . Not on file  Tobacco Use  . Smoking status: Former Smoker    Packs/day: 2.00    Years: 50.00    Pack years: 100.00    Types: Cigarettes, E-cigarettes    Quit date: 02/07/2012    Years since quitting: 7.4  . Smokeless tobacco: Never Used  Vaping Use  . Vaping Use: Every day  . Start date: 10/06/2013  . Devices: uses no liquid  Substance and Sexual Activity  . Alcohol use: No    Alcohol/week: 0.0 standard drinks  . Drug use: No  . Sexual activity: Yes  Other Topics Concern  . Not on file  Social History Narrative   Married   Retired   Clinical cytogeneticist level of education    No children    1 cup of coffee   Social Determinants of Radio broadcast assistant Strain:   . Difficulty of Paying Living Expenses:   Food Insecurity:   . Worried About Charity fundraiser in the Last Year:   . Arboriculturist in the Last Year:   Transportation Needs:   . Film/video editor (Medical):   Marland Kitchen Lack of Transportation (Non-Medical):   Physical Activity: Unknown  . Days of Exercise per Week: Patient refused  .  Minutes of Exercise per Session: Patient refused  Stress:   . Feeling of Stress :   Social Connections: Unknown  . Frequency of Communication with Friends and Family: Patient refused  . Frequency of Social Gatherings with Friends and Family: Patient refused  . Attends Religious Services: Patient refused  . Active Member of Clubs or Organizations: Patient refused  . Attends Archivist Meetings: Patient refused  . Marital Status: Patient refused  Intimate Partner Violence:   . Fear of Current or Ex-Partner:   . Emotionally Abused:   Marland Kitchen Physically Abused:   . Sexually Abused:     Family History  Problem Relation Age of Onset  . Hypertension Mother   . Ovarian cancer Mother 58  . Heart disease Father   . Stroke Father   . Ovarian cancer Sister        ? dx cancer had hyst.  . Breast cancer Neg Hx      Current Outpatient Medications:  .  buPROPion (WELLBUTRIN XL) 300 MG 24 hr tablet, Take 1 tablet by mouth once daily, Disp: 90 tablet, Rfl: 0 .  escitalopram (LEXAPRO) 10 MG tablet, Take 1 tablet by mouth once daily, Disp: 90 tablet, Rfl: 0 .  folic acid (FOLVITE) 1 MG tablet, Take 1 tablet (1 mg total) by mouth daily., Disp: 30 tablet, Rfl: 3 .  ibuprofen (ADVIL,MOTRIN) 200 MG tablet, Take 400 mg by mouth every 8 (eight) hours as needed for headache or moderate pain. , Disp: , Rfl:  .  levothyroxine (SYNTHROID) 150 MCG tablet, Take 1 tablet (150 mcg total) by mouth daily before breakfast., Disp: 30 tablet, Rfl: 3 .  lidocaine-prilocaine (EMLA) cream, Apply to affected area once, Disp: 30 g, Rfl: 3 .  loperamide (IMODIUM) 2 MG capsule, Take 1 capsule (2 mg total) by mouth every 2 (two) hours as needed for diarrhea or loose stools. Do not exceed 16mg  daily., Disp: 30 capsule, Rfl: 1 .  Multiple Vitamins-Minerals (PRESERVISION AREDS 2) CAPS, Take 1 capsule by mouth 2 (two) times a day. , Disp: , Rfl:  .  polyethylene glycol (MIRALAX / GLYCOLAX) 17 g packet, Take 17 g by mouth  daily as needed for mild constipation., Disp: 14 each, Rfl: 0 .  rosuvastatin (CRESTOR) 20 MG tablet, Take 1 tablet by mouth once daily, Disp: 90 tablet, Rfl: 0 No current facility-administered medications for this visit.  Facility-Administered Medications Ordered in Other Visits:  .  denosumab (XGEVA) injection 120 mg, 120 mg, Subcutaneous, Q30 days, Sindy Guadeloupe, MD, 120 mg at 03/05/19 1525  Physical exam: There were no vitals filed for this visit. Physical Exam HENT:     Head: Normocephalic and atraumatic.  Eyes:     Pupils: Pupils are equal, round, and reactive to light.  Cardiovascular:     Rate and Rhythm: Normal rate and regular rhythm.     Heart sounds: Normal heart sounds.  Pulmonary:     Effort: Pulmonary effort is normal.     Breath sounds: Normal breath sounds.  Abdominal:     General: Bowel sounds are normal.     Palpations: Abdomen is soft.  Musculoskeletal:     Cervical back: Normal range of motion.  Skin:    General: Skin is warm and dry.  Neurological:     Mental Status: She is alert and oriented to person, place, and time.      CMP Latest Ref Rng & Units 07/16/2019  Glucose 70 - 99 mg/dL 92  BUN 8 - 23 mg/dL 20  Creatinine 0.44 - 1.00 mg/dL 0.85  Sodium 135 - 145 mmol/L 141  Potassium 3.5 - 5.1 mmol/L 3.8  Chloride 98 - 111 mmol/L 102  CO2 22 - 32 mmol/L 29  Calcium 8.9 - 10.3 mg/dL 8.9  Total Protein 6.5 - 8.1 g/dL 7.3  Total Bilirubin 0.3 - 1.2 mg/dL 0.6  Alkaline Phos 38 - 126 U/L 53  AST 15 - 41 U/L 26  ALT 0 - 44 U/L 19   CBC Latest Ref Rng & Units 07/16/2019  WBC 4.0 - 10.5 K/uL 4.5  Hemoglobin 12.0 - 15.0 g/dL 11.9(L)  Hematocrit 36 - 46 % 37.2  Platelets 150 - 400 K/uL 135(L)     Assessment and plan- Patient is a 69 y.o. female with extensive stage small cell lung cancer and bone metastases.  She is here for on treatment assessment prior to cycle 14 of maintenance Tecentriq  Counts okay to proceed with cycle 14 of maintenance Tecentriq  today.  I will see her back in 3 weeks for cycle 15.  Repeat CT chest abdomen pelvis with contrast and bone scan after next cycle.  Immunotherapy induced hypothyroidism:She is currently on levothyroxine 150 mcg daily and follows  up with endocrinology as well.  I will check thyroid function tests at next visit  Bone metastases: We have been unable to give her Delton See unless her calcium levels are more than 9 given her delayed hypocalcemia requiring IV calcium Visit Diagnosis 1. Encounter for antineoplastic immunotherapy   2. Small cell lung cancer, right upper lobe ([place])      Dr. Randa Evens, MD, MPH [facility] [phone] 08/06/2019 8:52 AM

## 2019-08-07 ENCOUNTER — Other Ambulatory Visit: Payer: Self-pay

## 2019-08-07 ENCOUNTER — Encounter: Payer: Self-pay | Admitting: Oncology

## 2019-08-07 ENCOUNTER — Inpatient Hospital Stay: Payer: PPO

## 2019-08-07 ENCOUNTER — Inpatient Hospital Stay: Payer: PPO | Attending: Oncology

## 2019-08-07 ENCOUNTER — Inpatient Hospital Stay (HOSPITAL_BASED_OUTPATIENT_CLINIC_OR_DEPARTMENT_OTHER): Payer: PPO | Admitting: Oncology

## 2019-08-07 VITALS — BP 138/80 | HR 77 | Temp 97.2°F | Resp 16 | Ht 65.0 in | Wt 185.0 lb

## 2019-08-07 DIAGNOSIS — K7460 Unspecified cirrhosis of liver: Secondary | ICD-10-CM | POA: Diagnosis not present

## 2019-08-07 DIAGNOSIS — D696 Thrombocytopenia, unspecified: Secondary | ICD-10-CM | POA: Insufficient documentation

## 2019-08-07 DIAGNOSIS — E063 Autoimmune thyroiditis: Secondary | ICD-10-CM

## 2019-08-07 DIAGNOSIS — Z923 Personal history of irradiation: Secondary | ICD-10-CM | POA: Diagnosis not present

## 2019-08-07 DIAGNOSIS — C3411 Malignant neoplasm of upper lobe, right bronchus or lung: Secondary | ICD-10-CM | POA: Diagnosis not present

## 2019-08-07 DIAGNOSIS — C7951 Secondary malignant neoplasm of bone: Secondary | ICD-10-CM | POA: Diagnosis not present

## 2019-08-07 DIAGNOSIS — E669 Obesity, unspecified: Secondary | ICD-10-CM | POA: Insufficient documentation

## 2019-08-07 DIAGNOSIS — M25551 Pain in right hip: Secondary | ICD-10-CM | POA: Insufficient documentation

## 2019-08-07 DIAGNOSIS — Z8041 Family history of malignant neoplasm of ovary: Secondary | ICD-10-CM | POA: Diagnosis not present

## 2019-08-07 DIAGNOSIS — E785 Hyperlipidemia, unspecified: Secondary | ICD-10-CM | POA: Insufficient documentation

## 2019-08-07 DIAGNOSIS — Z87891 Personal history of nicotine dependence: Secondary | ICD-10-CM | POA: Insufficient documentation

## 2019-08-07 DIAGNOSIS — Z5112 Encounter for antineoplastic immunotherapy: Secondary | ICD-10-CM | POA: Diagnosis not present

## 2019-08-07 LAB — CBC WITH DIFFERENTIAL/PLATELET
Abs Immature Granulocytes: 0.02 10*3/uL (ref 0.00–0.07)
Basophils Absolute: 0.1 10*3/uL (ref 0.0–0.1)
Basophils Relative: 1 %
Eosinophils Absolute: 0.5 10*3/uL (ref 0.0–0.5)
Eosinophils Relative: 8 %
HCT: 37.8 % (ref 36.0–46.0)
Hemoglobin: 12.1 g/dL (ref 12.0–15.0)
Immature Granulocytes: 0 %
Lymphocytes Relative: 15 %
Lymphs Abs: 1 10*3/uL (ref 0.7–4.0)
MCH: 28.8 pg (ref 26.0–34.0)
MCHC: 32 g/dL (ref 30.0–36.0)
MCV: 90 fL (ref 80.0–100.0)
Monocytes Absolute: 0.4 10*3/uL (ref 0.1–1.0)
Monocytes Relative: 7 %
Neutro Abs: 4.4 10*3/uL (ref 1.7–7.7)
Neutrophils Relative %: 69 %
Platelets: 160 10*3/uL (ref 150–400)
RBC: 4.2 MIL/uL (ref 3.87–5.11)
RDW: 14.7 % (ref 11.5–15.5)
WBC: 6.5 10*3/uL (ref 4.0–10.5)
nRBC: 0 % (ref 0.0–0.2)

## 2019-08-07 LAB — COMPREHENSIVE METABOLIC PANEL
ALT: 17 U/L (ref 0–44)
AST: 28 U/L (ref 15–41)
Albumin: 4.2 g/dL (ref 3.5–5.0)
Alkaline Phosphatase: 75 U/L (ref 38–126)
Anion gap: 11 (ref 5–15)
BUN: 26 mg/dL — ABNORMAL HIGH (ref 8–23)
CO2: 29 mmol/L (ref 22–32)
Calcium: 8.9 mg/dL (ref 8.9–10.3)
Chloride: 100 mmol/L (ref 98–111)
Creatinine, Ser: 0.96 mg/dL (ref 0.44–1.00)
GFR calc Af Amer: >60 mL/min (ref >60)
GFR calc non Af Amer: 60 mL/min — ABNORMAL LOW (ref >60)
Glucose, Bld: 113 mg/dL — ABNORMAL HIGH (ref 70–99)
Potassium: 4.1 mmol/L (ref 3.5–5.1)
Sodium: 140 mmol/L (ref 135–145)
Total Bilirubin: 0.9 mg/dL (ref 0.3–1.2)
Total Protein: 7.6 g/dL (ref 6.5–8.1)

## 2019-08-07 MED ORDER — SODIUM CHLORIDE 0.9 % IV SOLN
Freq: Once | INTRAVENOUS | Status: AC
  Filled 2019-08-07: qty 250

## 2019-08-07 MED ORDER — SODIUM CHLORIDE 0.9 % IV SOLN
1200.0000 mg | Freq: Once | INTRAVENOUS | Status: AC
  Administered 2019-08-07: 1200 mg via INTRAVENOUS
  Filled 2019-08-07: qty 20

## 2019-08-07 MED ORDER — HEPARIN SOD (PORK) LOCK FLUSH 100 UNIT/ML IV SOLN
500.0000 [IU] | Freq: Once | INTRAVENOUS | Status: AC
  Administered 2019-08-07: 500 [IU] via INTRAVENOUS
  Filled 2019-08-07: qty 5

## 2019-08-07 MED ORDER — HEPARIN SOD (PORK) LOCK FLUSH 100 UNIT/ML IV SOLN
INTRAVENOUS | Status: AC
  Filled 2019-08-07: qty 5

## 2019-08-07 MED ORDER — SODIUM CHLORIDE 0.9% FLUSH
10.0000 mL | INTRAVENOUS | Status: DC | PRN
  Administered 2019-08-07: 10 mL via INTRAVENOUS
  Filled 2019-08-07: qty 10

## 2019-08-07 NOTE — Progress Notes (Signed)
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 She has been referred to [place] for findings of bone metastases and her recent MRI. She has a prior history of 3 packs/day day smoking for over 45 years and quit smoking 5 years ago.She had a CT chest abdomen pelvis in 2018 which showed a 5 mm lung nodule in the left lower lobe. Recently over the last 2 months patient has been having worsening back pain and was referred to orthopedics. 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 Pathologic fracture of S1.  PET scan showed 2 RUL lung nodules, hilar and mediastinal adenopathy and widespread bone mets. MRI brain negative.  Patientcompletedpalliative RT to her spine. Bronchoscopy showed small cell lung cancer. Palliative carboplatin, etoposide and Tecentriq started on 08/18/2018. Scans after 4 cycles showed stable disease. She is on maintenance tecentriq  Interval history-patient reports she had a fall when getting out of her bed and walking to the bathroom yesterday night when she was wearing her socks and slipped.  She reports  she hurt her right hip.  He did not hit her head.  Reports some pain in the right hip but is able to ambulate without any difficulties  ECOG PS- 1 Pain scale- 5   Review of systems- Review of Systems  Constitutional: Negative for chills, fever, malaise/fatigue and weight loss.  HENT: Negative for congestion, ear discharge and nosebleeds.   Eyes: Negative for blurred vision.  Respiratory: Negative for cough, hemoptysis, sputum production, shortness of breath and wheezing.   Cardiovascular: Negative for chest pain, palpitations, orthopnea and claudication.  Gastrointestinal: Negative for abdominal pain, blood in stool, constipation, diarrhea, heartburn, melena, nausea and vomiting.  Genitourinary: Negative for dysuria, flank pain, frequency, hematuria and urgency.  Musculoskeletal: Negative for back pain, joint pain and myalgias.       Pain over right buttock  Skin: Negative for rash.  Neurological: Negative for dizziness, tingling, focal weakness, seizures, weakness and headaches.  Endo/Heme/Allergies: Does not bruise/bleed easily.  Psychiatric/Behavioral: Negative for depression and suicidal ideas. The patient does not have insomnia.       Allergies  Allergen Reactions   Bee Venom Swelling     Past Medical History:  Diagnosis Date   Arthritis    Cirrhosis ([place])    Depression    GERD (gastroesophageal reflux disease)    Hyperlipidemia    Hypertension    Lung cancer ([place])    Metastatic bone cancer [facility])      Past Surgical History:  Procedure Laterality Date   APPENDECTOMY  1971   CHOLECYSTECTOMY  1971   COLONOSCOPY WITH PROPOFOL N/A 11/15/2016   Procedure: COLONOSCOPY WITH PROPOFOL;  Surgeon: Jonathon Bellows, MD;  Location: [facility] ENDOSCOPY;  Service: Gastroenterology;  Laterality: N/A;   ENDOBRONCHIAL ULTRASOUND Right 07/30/2018   Procedure: ENDOBRONCHIAL ULTRASOUND;  Surgeon: Laverle Hobby, MD;  Location: [facility] ORS;  Service: Pulmonary;  Laterality:  Right;   ESOPHAGOGASTRODUODENOSCOPY (EGD) WITH PROPOFOL N/A 01/07/2017   Procedure: ESOPHAGOGASTRODUODENOSCOPY (EGD) WITH PROPOFOL;  Surgeon: Jonathon Bellows, MD;  Location: [facility] ENDOSCOPY;  Service: Gastroenterology;  Laterality: N/A;   LAPAROSCOPY N/A 03/01/2017   Procedure: LAPAROSCOPY DIAGNOSTIC;  Surgeon: Robert Bellow, MD;  Location: [facility] ORS;  Service: General;  Laterality: N/A;   PORTA CATH INSERTION N/A 08/14/2018   Procedure: PORTA CATH INSERTION;  Surgeon: Algernon Huxley, MD;  Location: [place] CV LAB;  Service: Cardiovascular;  Laterality: N/A;   TONSILECTOMY, ADENOIDECTOMY, BILATERAL MYRINGOTOMY AND TUBES  1955   TONSILLECTOMY     VENTRAL HERNIA REPAIR N/A 03/01/2017   10 x 14 CM Ventralight ST mesh, intraperitoneal location.    VENTRAL HERNIA REPAIR N/A 03/01/2017   Procedure: HERNIA REPAIR VENTRAL ADULT;  Surgeon: Robert Bellow, MD;  Location: [facility] ORS;  Service: General;  Laterality: N/A;    Social History   Socioeconomic History   Marital status: Married    Spouse name: Johnny   Number of children: Not on file   Years of education: Not on file   Highest education level: Not on file  Occupational History   Not on file  Tobacco Use   Smoking status: Former Smoker    Packs/day: 2.00    Years: 50.00    Pack years: 100.00    Types: Cigarettes, E-cigarettes    Quit date: 02/07/2012    Years since quitting: 7.5   Smokeless tobacco: Never Used  Vaping Use   Vaping Use: Every day   Start date: 10/06/2013   Devices: uses no liquid  Substance and Sexual Activity   Alcohol use: No    Alcohol/week: 0.0 standard drinks   Drug use: No   Sexual activity: Yes  Other Topics Concern   Not on file  Social History Narrative   Married   Retired   Clinical cytogeneticist level of education    No children    1 cup of coffee   Social Determinants of Radio broadcast assistant Strain:    Difficulty of Paying Living Expenses:   Food Insecurity:     Worried About Charity fundraiser in the Last Year:    Arboriculturist in the Last Year:   Transportation Needs:    Film/video editor (Medical):    Lack of Transportation (Non-Medical):   Physical Activity: Unknown   Days of Exercise per Week: Patient refused   Minutes of Exercise per Session: Patient refused  Stress:    Feeling of Stress :   Social Connections: Unknown   Frequency of Communication with Friends and Family: Patient refused   Frequency of Social Gatherings with Friends and Family: Patient refused   Attends Religious Services: Patient refused   Marine scientist or Organizations: Patient refused   Attends Music therapist: Patient refused   Marital Status: Patient refused  Intimate Production manager Violence:    Fear of Current or Ex-Partner:    Emotionally Abused:    Physically Abused:    Sexually Abused:     Family History  Problem Relation Age of Onset   Hypertension Mother    Ovarian cancer Mother 64   Heart disease Father    Stroke Father    Ovarian cancer Sister        ?  dx cancer had hyst.   Breast cancer Neg Hx      Current Outpatient Medications:    buPROPion (WELLBUTRIN XL) 300 MG 24 hr tablet, Take 1 tablet by mouth once daily, Disp: 90 tablet, Rfl: 0   escitalopram (LEXAPRO) 10 MG tablet, Take 1 tablet by mouth once daily, Disp: 90 tablet, Rfl: 0   folic acid (FOLVITE) 1 MG tablet, Take 1 tablet (1 mg total) by mouth daily., Disp: 30 tablet, Rfl: 3   ibuprofen (ADVIL,MOTRIN) 200 MG tablet, Take 400 mg by mouth every 8 (eight) hours as needed for headache or moderate pain. , Disp: , Rfl:    levothyroxine (SYNTHROID) 150 MCG tablet, Take 1 tablet (150 mcg total) by mouth daily before breakfast., Disp: 30 tablet, Rfl: 3   lidocaine-prilocaine (EMLA) cream, Apply to affected area once, Disp: 30 g, Rfl: 3   loperamide (IMODIUM) 2 MG capsule, Take 1 capsule (2 mg total) by mouth every 2 (two) hours as needed for  diarrhea or loose stools. Do not exceed 16mg  daily., Disp: 30 capsule, Rfl: 1   Multiple Vitamins-Minerals (PRESERVISION AREDS 2) CAPS, Take 1 capsule by mouth 2 (two) times a day. , Disp: , Rfl:    polyethylene glycol (MIRALAX / GLYCOLAX) 17 g packet, Take 17 g by mouth daily as needed for mild constipation., Disp: 14 each, Rfl: 0   rosuvastatin (CRESTOR) 20 MG tablet, Take 1 tablet by mouth once daily, Disp: 90 tablet, Rfl: 0 No current facility-administered medications for this visit.  Facility-Administered Medications Ordered in Other Visits:    denosumab (XGEVA) injection 120 mg, 120 mg, Subcutaneous, Q30 days, Sindy Guadeloupe, MD, 120 mg at 03/05/19 1525   heparin lock flush 100 unit/mL, 500 Units, Intravenous, Once, Sindy Guadeloupe, MD   sodium chloride flush (NS) 0.9 % injection 10 mL, 10 mL, Intravenous, PRN, Sindy Guadeloupe, MD, 10 mL at 08/07/19 0818  Physical exam:  Vitals:   08/07/19 0831  BP: 138/80  Pulse: 77  Resp: 16  Temp: (!) 97.2 F (36.2 C)  TempSrc: Tympanic  SpO2: 98%  Weight: 185 lb (83.9 kg)  Height: 5\' 5"  (1.651 m)   Physical Exam Constitutional:      General: She is not in acute distress. Cardiovascular:     Rate and Rhythm: Normal rate and regular rhythm.     Heart sounds: Normal heart sounds.  Pulmonary:     Effort: Pulmonary effort is normal.     Breath sounds: Normal breath sounds.  Abdominal:     General: Bowel sounds are normal.     Palpations: Abdomen is soft.  Musculoskeletal:        General: No swelling or deformity.  Skin:    General: Skin is warm and dry.  Neurological:     Mental Status: She is alert and oriented to person, place, and time.      CMP Latest Ref Rng & Units 08/07/2019  Glucose 70 - 99 mg/dL 113(H)  BUN 8 - 23 mg/dL 26(H)  Creatinine 0.44 - 1.00 mg/dL 0.96  Sodium 135 - 145 mmol/L 140  Potassium 3.5 - 5.1 mmol/L 4.1  Chloride 98 - 111 mmol/L 100  CO2 22 - 32 mmol/L 29  Calcium 8.9 - 10.3 mg/dL 8.9  Total  Protein 6.5 - 8.1 g/dL 7.6  Total Bilirubin 0.3 - 1.2 mg/dL 0.9  Alkaline Phos 38 - 126 U/L 75  AST 15 - 41 U/L 28  ALT 0 - 44 U/L 17  CBC Latest Ref Rng & Units 07/16/2019  WBC 4.0 - 10.5 K/uL 4.5  Hemoglobin 12.0 - 15.0 g/dL 11.9(L)  Hematocrit 36 - 46 % 37.2  Platelets 150 - 400 K/uL 135(L)      Assessment and plan- Patient is a 69 y.o. female with extensive stage small cell lung cancer and bone metastases.  She is here for on treatment assessment prior to cycle 13 of maintenance Tecentriq  Counts okay to proceed with cycle 13 of maintenance Tecentriq today.  I will see her back in 3 weeks for cycle 14.  Overall she is tolerating treatment well without any significant side effects.Plan to repeat CT chest abdomen and pelvis with contrast as well as bone scan in 4 weeks.  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  Also sees endocrinology.  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  It is mild and intermittent.  Today it is normal.  Continue to monitor   Bone metastases: Patient developed a delayed hypocalcemia after prior doses of Xgeva.  Unless her calcium level is more than 9 I am holding her Xgeva at this time   Visit Diagnosis 1. Encounter for antineoplastic immunotherapy   2. Small cell lung cancer, right upper lobe ([place])   3. Autoimmune hypothyroidism      Dr. Randa Evens, MD, MPH [facility] [phone] 08/07/2019 8:56 AM

## 2019-08-21 ENCOUNTER — Other Ambulatory Visit: Payer: Self-pay

## 2019-08-21 MED ORDER — LEVOTHYROXINE SODIUM 150 MCG PO TABS: 150.0000 ug | ORAL_TABLET | Freq: Every day | ORAL | 3 refills | Status: DC

## 2019-08-28 ENCOUNTER — Inpatient Hospital Stay: Payer: PPO

## 2019-08-28 ENCOUNTER — Encounter: Payer: Self-pay | Admitting: Oncology

## 2019-08-28 ENCOUNTER — Inpatient Hospital Stay (HOSPITAL_BASED_OUTPATIENT_CLINIC_OR_DEPARTMENT_OTHER): Payer: PPO | Admitting: Oncology

## 2019-08-28 ENCOUNTER — Other Ambulatory Visit: Payer: Self-pay

## 2019-08-28 VITALS — BP 149/89 | HR 72 | Temp 97.1°F | Resp 18 | Wt 188.4 lb

## 2019-08-28 DIAGNOSIS — Z5112 Encounter for antineoplastic immunotherapy: Secondary | ICD-10-CM

## 2019-08-28 DIAGNOSIS — E063 Autoimmune thyroiditis: Secondary | ICD-10-CM

## 2019-08-28 DIAGNOSIS — C3411 Malignant neoplasm of upper lobe, right bronchus or lung: Secondary | ICD-10-CM

## 2019-08-28 DIAGNOSIS — Z95828 Presence of other vascular implants and grafts: Secondary | ICD-10-CM

## 2019-08-28 LAB — CBC WITH DIFFERENTIAL/PLATELET
Abs Immature Granulocytes: 0.02 10*3/uL (ref 0.00–0.07)
Basophils Absolute: 0 10*3/uL (ref 0.0–0.1)
Basophils Relative: 1 %
Eosinophils Absolute: 0.2 10*3/uL (ref 0.0–0.5)
Eosinophils Relative: 5 %
HCT: 35.5 % — ABNORMAL LOW (ref 36.0–46.0)
Hemoglobin: 11.3 g/dL — ABNORMAL LOW (ref 12.0–15.0)
Immature Granulocytes: 0 %
Lymphocytes Relative: 18 %
Lymphs Abs: 0.8 10*3/uL (ref 0.7–4.0)
MCH: 28.6 pg (ref 26.0–34.0)
MCHC: 31.8 g/dL (ref 30.0–36.0)
MCV: 89.9 fL (ref 80.0–100.0)
Monocytes Absolute: 0.3 10*3/uL (ref 0.1–1.0)
Monocytes Relative: 7 %
Neutro Abs: 3.1 10*3/uL (ref 1.7–7.7)
Neutrophils Relative %: 69 %
Platelets: 141 10*3/uL — ABNORMAL LOW (ref 150–400)
RBC: 3.95 MIL/uL (ref 3.87–5.11)
RDW: 15.8 % — ABNORMAL HIGH (ref 11.5–15.5)
WBC: 4.5 10*3/uL (ref 4.0–10.5)
nRBC: 0 % (ref 0.0–0.2)

## 2019-08-28 LAB — COMPREHENSIVE METABOLIC PANEL
ALT: 18 U/L (ref 0–44)
AST: 30 U/L (ref 15–41)
Albumin: 3.8 g/dL (ref 3.5–5.0)
Alkaline Phosphatase: 69 U/L (ref 38–126)
Anion gap: 9 (ref 5–15)
BUN: 18 mg/dL (ref 8–23)
CO2: 31 mmol/L (ref 22–32)
Calcium: 9 mg/dL (ref 8.9–10.3)
Chloride: 101 mmol/L (ref 98–111)
Creatinine, Ser: 1.07 mg/dL — ABNORMAL HIGH (ref 0.44–1.00)
GFR calc Af Amer: >60 mL/min (ref >60)
GFR calc non Af Amer: 52 mL/min — ABNORMAL LOW (ref >60)
Glucose, Bld: 93 mg/dL (ref 70–99)
Potassium: 3.9 mmol/L (ref 3.5–5.1)
Sodium: 141 mmol/L (ref 135–145)
Total Bilirubin: 0.6 mg/dL (ref 0.3–1.2)
Total Protein: 7.2 g/dL (ref 6.5–8.1)

## 2019-08-28 LAB — TSH: TSH: 18.369 u[IU]/mL — ABNORMAL HIGH (ref 0.350–4.500)

## 2019-08-28 MED ORDER — SODIUM CHLORIDE 0.9 % IV SOLN
1200.0000 mg | Freq: Once | INTRAVENOUS | Status: AC
  Administered 2019-08-28: 1200 mg via INTRAVENOUS
  Filled 2019-08-28: qty 20

## 2019-08-28 MED ORDER — SODIUM CHLORIDE 0.9 % IV SOLN
Freq: Once | INTRAVENOUS | Status: AC
  Filled 2019-08-28: qty 250

## 2019-08-28 MED ORDER — SODIUM CHLORIDE 0.9% FLUSH
10.0000 mL | INTRAVENOUS | Status: DC | PRN
  Administered 2019-08-28: 10 mL via INTRAVENOUS
  Filled 2019-08-28: qty 10

## 2019-08-28 MED ORDER — HEPARIN SOD (PORK) LOCK FLUSH 100 UNIT/ML IV SOLN
500.0000 [IU] | Freq: Once | INTRAVENOUS | Status: AC | PRN
  Administered 2019-08-28: 500 [IU]
  Filled 2019-08-28: qty 5

## 2019-08-28 NOTE — Progress Notes (Signed)
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 She has been referred to [place] for findings of bone metastases and her recent MRI. She has a prior history of 3 packs/day day smoking for over 45 years and quit smoking 5 years ago.She had a CT chest abdomen pelvis in 2018 which showed a 5 mm lung nodule in the left lower lobe. Recently over the last 2 months patient has been having worsening back pain and was referred to orthopedics. 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 Pathologic fracture of S1.  PET scan showed 2 RUL lung nodules, hilar and mediastinal adenopathy and widespread bone mets. MRI brain negative.  Patientcompletedpalliative RT to her spine. Bronchoscopy showed small cell lung cancer. Palliative carboplatin, etoposide and Tecentriq started on 08/18/2018. Scans after 4 cycles showed stable disease. She is on maintenance tecentriq  Interval history-patient currently feels well and denies any complaints at this time  ECOG PS- 1 Pain scale- 0   Review of systems- Review of Systems  Constitutional: Negative for  chills, fever, malaise/fatigue and weight loss.  HENT: Negative for congestion, ear discharge and nosebleeds.   Eyes: Negative for blurred vision.  Respiratory: Negative for cough, hemoptysis, sputum production, shortness of breath and wheezing.   Cardiovascular: Negative for chest pain, palpitations, orthopnea and claudication.  Gastrointestinal: Negative for abdominal pain, blood in stool, constipation, diarrhea, heartburn, melena, nausea and vomiting.  Genitourinary: Negative for dysuria, flank pain, frequency, hematuria and urgency.  Musculoskeletal: Negative for back pain, joint pain and myalgias.  Skin: Negative for rash.  Neurological: Negative for dizziness, tingling, focal weakness, seizures, weakness and headaches.  Endo/Heme/Allergies: Does not bruise/bleed easily.  Psychiatric/Behavioral: Negative for depression and suicidal ideas. The patient does not have insomnia.        Allergies  Allergen Reactions  . Bee Venom Swelling     Past Medical History:  Diagnosis Date  . Arthritis   . Cirrhosis ([place])   . Depression   . GERD (gastroesophageal reflux disease)   . Hyperlipidemia   . Hypertension   . Lung cancer ([place])   . Metastatic bone cancer [facility])      Past Surgical History:  Procedure Laterality Date  . APPENDECTOMY  1971  . CHOLECYSTECTOMY  1971  . COLONOSCOPY WITH PROPOFOL N/A 11/15/2016   Procedure: COLONOSCOPY WITH PROPOFOL;  Surgeon: Jonathon Bellows, MD;  Location: [facility] ENDOSCOPY;  Service: Gastroenterology;  Laterality: N/A;  . ENDOBRONCHIAL ULTRASOUND Right 07/30/2018   Procedure: ENDOBRONCHIAL ULTRASOUND;  Surgeon: Laverle Hobby, MD;  Location: [facility] ORS;  Service: Pulmonary;  Laterality: Right;  . ESOPHAGOGASTRODUODENOSCOPY (EGD) WITH PROPOFOL N/A 01/07/2017   Procedure: ESOPHAGOGASTRODUODENOSCOPY (EGD) WITH PROPOFOL;  Surgeon: Jonathon Bellows, MD;  Location: [facility] ENDOSCOPY;  Service: Gastroenterology;  Laterality: N/A;  . LAPAROSCOPY N/A 03/01/2017    Procedure: LAPAROSCOPY DIAGNOSTIC;  Surgeon: Robert Bellow, MD;  Location: [facility] ORS;  Service: General;  Laterality: N/A;  . PORTA CATH INSERTION N/A 08/14/2018   Procedure: PORTA CATH INSERTION;  Surgeon: Algernon Huxley, MD;  Location: [place] CV LAB;  Service: Cardiovascular;  Laterality: N/A;  . TONSILECTOMY, ADENOIDECTOMY, BILATERAL MYRINGOTOMY [place]  . TONSILLECTOMY    . VENTRAL HERNIA REPAIR N/A 03/01/2017   10 x 14 CM Ventralight ST mesh, intraperitoneal location.   . VENTRAL HERNIA REPAIR N/A 03/01/2017   Procedure: HERNIA REPAIR VENTRAL ADULT;  Surgeon: Robert Bellow, MD;  Location: [facility] ORS;  Service: General;  Laterality: N/A;    Social History   Socioeconomic History  . Marital status: Married    Spouse name: [name]  . Number of children: Not on file  . Years of education: Not on file  . Highest education level: Not on file  Occupational History  . Not on file  Tobacco Use  . Smoking status: Former Smoker    Packs/day: 2.00    Years: 50.00    Pack years: 100.00    Types: Cigarettes, E-cigarettes    Quit date: 02/07/2012    Years since quitting: 7.5  . Smokeless tobacco: Never Used  Vaping Use  . Vaping Use: Every day  . Start date: 10/06/2013  . Devices: uses no liquid  Substance and Sexual Activity  . Alcohol use: No    Alcohol/week: 0.0 standard drinks  . Drug use: No  . Sexual activity: Yes  Other Topics Concern  . Not on file  Social History Narrative   Married   Retired   Clinical cytogeneticist level of education    No children    1 cup of coffee   Social Determinants of Radio broadcast assistant Strain:   . Difficulty of Paying Living Expenses:   Food Insecurity:   . Worried About Charity fundraiser in the Last Year:   . Arboriculturist in the Last Year:   Transportation Needs:   . Film/video editor (Medical):   Marland Kitchen Lack of Transportation (Non-Medical):   Physical Activity: Unknown  . Days of Exercise per Week: Patient  refused  . Minutes of Exercise per Session: Patient refused  Stress:   . Feeling of Stress :   Social Connections: Unknown  . Frequency of Communication with Friends and Family: Patient refused  . Frequency of Social Gatherings with Friends and Family: Patient refused  . Attends Religious Services: Patient refused  . Active Member of Clubs or Organizations: Patient refused  . Attends Archivist Meetings: Patient refused  . Marital Status: Patient refused  Intimate Partner Violence:   . Fear of Current or Ex-Partner:   . Emotionally Abused:   Marland Kitchen Physically Abused:   . Sexually Abused:     Family History  Problem Relation Age of Onset  . Hypertension Mother   . Ovarian cancer Mother 2  . Heart disease Father   . Stroke Father   . Ovarian cancer Sister        ? dx cancer had hyst.  . Breast cancer Neg Hx      Current Outpatient Medications:  .  buPROPion (WELLBUTRIN XL) 300 MG 24 hr tablet, Take 1 tablet by mouth once daily, Disp: 90 tablet, Rfl: 0 .  escitalopram (LEXAPRO) 10 MG tablet, Take 1 tablet by mouth once daily, Disp: 90  tablet, Rfl: 0 .  folic acid (FOLVITE) 1 MG tablet, Take 1 tablet (1 mg total) by mouth daily., Disp: 30 tablet, Rfl: 3 .  ibuprofen (ADVIL,MOTRIN) 200 MG tablet, Take 400 mg by mouth every 8 (eight) hours as needed for headache or moderate pain. , Disp: , Rfl:  .  levothyroxine (SYNTHROID) 150 MCG tablet, Take 1 tablet (150 mcg total) by mouth daily before breakfast., Disp: 30 tablet, Rfl: 3 .  lidocaine-prilocaine (EMLA) cream, Apply to affected area once, Disp: 30 g, Rfl: 3 .  loperamide (IMODIUM) 2 MG capsule, Take 1 capsule (2 mg total) by mouth every 2 (two) hours as needed for diarrhea or loose stools. Do not exceed 16mg  daily., Disp: 30 capsule, Rfl: 1 .  Multiple Vitamins-Minerals (PRESERVISION AREDS 2) CAPS, Take 1 capsule by mouth 2 (two) times a day. , Disp: , Rfl:  .  polyethylene glycol (MIRALAX / GLYCOLAX) 17 g packet, Take 17 g  by mouth daily as needed for mild constipation., Disp: 14 each, Rfl: 0 .  rosuvastatin (CRESTOR) 20 MG tablet, Take 1 tablet by mouth once daily, Disp: 90 tablet, Rfl: 0 No current facility-administered medications for this visit.  Facility-Administered Medications Ordered in Other Visits:  .  denosumab (XGEVA) injection 120 mg, 120 mg, Subcutaneous, Q30 days, Sindy Guadeloupe, MD, 120 mg at 03/05/19 1525  Physical exam:  Vitals:   08/28/19 1012  BP: (!) 149/89  Pulse: 72  Resp: 18  Temp: (!) 97.1 F (36.2 C)  TempSrc: Tympanic  SpO2: 96%  Weight: 188 lb 6.4 oz (85.5 kg)   Physical Exam Constitutional:      General: She is not in acute distress. Cardiovascular:     Rate and Rhythm: Normal rate and regular rhythm.     Heart sounds: Normal heart sounds.  Pulmonary:     Effort: Pulmonary effort is normal.     Breath sounds: Normal breath sounds.  Abdominal:     General: Bowel sounds are normal.     Palpations: Abdomen is soft.  Skin:    General: Skin is warm and dry.  Neurological:     Mental Status: She is alert and oriented to person, place, and time.      CMP Latest Ref Rng & Units 08/28/2019  Glucose 70 - 99 mg/dL 93  BUN 8 - 23 mg/dL 18  Creatinine 0.44 - 1.00 mg/dL 1.07(H)  Sodium 135 - 145 mmol/L 141  Potassium 3.5 - 5.1 mmol/L 3.9  Chloride 98 - 111 mmol/L 101  CO2 22 - 32 mmol/L 31  Calcium 8.9 - 10.3 mg/dL 9.0  Total Protein 6.5 - 8.1 g/dL 7.2  Total Bilirubin 0.3 - 1.2 mg/dL 0.6  Alkaline Phos 38 - 126 U/L 69  AST 15 - 41 U/L 30  ALT 0 - 44 U/L 18   CBC Latest Ref Rng & Units 08/28/2019  WBC 4.0 - 10.5 K/uL 4.5  Hemoglobin 12.0 - 15.0 g/dL 11.3(L)  Hematocrit 36 - 46 % 35.5(L)  Platelets 150 - 400 K/uL 141(L)    Assessment and plan- Patient is a 69 y.o. female with extensive stage small cell lung cancer and bone metastases.  she is here for on treatment assessment prior to cycle 14 of maintenance tecentriq  Counts ok to proceed with cycle 14 of  maintenance tecentriq today. I will see her back in 3 weeks for cycle 15. 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 TSH went down and  then came back to 18. Will repeat in 3 weeks.  She also follows up with endocrinology   Visit Diagnosis 1. Small cell lung cancer, right upper lobe ([place])   2. Autoimmune hypothyroidism   3. Encounter for antineoplastic immunotherapy      Dr. Randa Evens, MD, MPH [facility] at [facility] [phone] 08/28/2019 1:05 PM

## 2019-09-07 ENCOUNTER — Encounter
Admission: RE | Admit: 2019-09-07 | Discharge: 2019-09-07 | Disposition: A | Discharge status: Home / Self Care | Payer: PPO | Source: Ambulatory Visit | Attending: Oncology | Admitting: Oncology

## 2019-09-07 ENCOUNTER — Other Ambulatory Visit: Payer: Self-pay

## 2019-09-07 ENCOUNTER — Encounter (HOSPITAL_BASED_OUTPATIENT_CLINIC_OR_DEPARTMENT_OTHER)
Admission: RE | Admit: 2019-09-07 | Discharge: 2019-09-07 | Disposition: A | Discharge status: Home / Self Care | Payer: PPO | Source: Ambulatory Visit | Attending: Oncology | Admitting: Oncology

## 2019-09-07 ENCOUNTER — Ambulatory Visit
Admission: RE | Admit: 2019-09-07 | Discharge: 2019-09-07 | Disposition: A | Discharge status: Home / Self Care | Payer: PPO | Source: Ambulatory Visit | Attending: Oncology | Admitting: Oncology

## 2019-09-07 DIAGNOSIS — C3411 Malignant neoplasm of upper lobe, right bronchus or lung: Secondary | ICD-10-CM | POA: Insufficient documentation

## 2019-09-07 DIAGNOSIS — Z85118 Personal history of other malignant neoplasm of bronchus and lung: Secondary | ICD-10-CM | POA: Diagnosis not present

## 2019-09-07 DIAGNOSIS — C7951 Secondary malignant neoplasm of bone: Secondary | ICD-10-CM | POA: Diagnosis not present

## 2019-09-07 DIAGNOSIS — C3490 Malignant neoplasm of unspecified part of unspecified bronchus or lung: Secondary | ICD-10-CM | POA: Diagnosis not present

## 2019-09-07 MED ORDER — TECHNETIUM TC 99M MEDRONATE IV KIT
22.3000 | PACK | Freq: Once | INTRAVENOUS | Status: AC | PRN
  Administered 2019-09-07: 22.3 via INTRAVENOUS

## 2019-09-07 MED ORDER — IOHEXOL 300 MG/ML  SOLN
100.0000 mL | Freq: Once | INTRAMUSCULAR | Status: AC | PRN
  Administered 2019-09-07: 100 mL via INTRAVENOUS

## 2019-09-08 ENCOUNTER — Ambulatory Visit (INDEPENDENT_AMBULATORY_CARE_PROVIDER_SITE_OTHER): Payer: PPO

## 2019-09-08 VITALS — Ht 65.0 in | Wt 188.0 lb

## 2019-09-08 DIAGNOSIS — Z0000 Encounter for general adult medical examination without abnormal findings: Secondary | ICD-10-CM | POA: Diagnosis not present

## 2019-09-08 NOTE — Patient Instructions (Addendum)
Anne [name] , Thank you for taking time to come for your Medicare Wellness Visit. I appreciate your ongoing commitment to your health goals. Please review the following plan we discussed and let me know if I can assist you in the future.   These are the goals we discussed: Goals      Patient Stated   .  DIET - Healthy diet (pt-stated)       This is a list of the screening recommended for you and due dates:  Health Maintenance  Topic Date Due  . COVID-19 Vaccine (1) Never done  . Flu Shot  09/06/2019  . Mammogram  09/07/2020*  . Colon Cancer Screening  11/15/2021  . Tetanus Vaccine  12/07/2027  . DEXA scan (bone density measurement)  Completed  .  Hepatitis C: One time screening is recommended by Center for Disease Control  (CDC) for  adults born from [birth year] through 1967.   Completed  . Pneumonia vaccines  Completed  *Topic was postponed. The date shown is not the original due date.    Immunizations Immunization History  Administered Date(s) Administered  . Influenza, High Dose Seasonal PF 11/02/2016, 09/22/2017, 10/26/2017, 10/16/2018  . Influenza-Unspecified 10/15/2014, 10/29/2015, 11/01/2016  . Pneumococcal Conjugate-13 11/13/2013  . Pneumococcal Polysaccharide-23 11/30/2014  . Td 12/06/2017  . Zoster 11/15/2014   Keep all routine maintenance appointments.   Follow up 01/26/20 @ 12:00  Advanced directives: declined  Conditions/risks identified: none new.  Follow up in one year for your annual wellness visit.   Preventive Care 53 Years and Older, Female Preventive care refers to lifestyle choices and visits with your health care provider that can promote health and wellness. What does preventive care include?  A yearly physical exam. This is also called an annual well check.  Dental exams once or twice a year.  Routine eye exams. Ask your health care provider how often you should have your eyes checked.  Personal lifestyle choices, including:  Daily care of  your teeth and gums.  Regular physical activity.  Eating a healthy diet.  Avoiding tobacco and drug use.  Limiting alcohol use.  Practicing safe sex.  Taking low-dose aspirin every day.  Taking vitamin and mineral supplements as recommended by your health care provider. What happens during an annual well check? The services and screenings done by your health care provider during your annual well check will depend on your age, overall health, lifestyle risk factors, and family history of disease. Counseling  Your health care provider may ask you questions about your:  Alcohol use.  Tobacco use.  Drug use.  Emotional well-being.  Home and relationship well-being.  Sexual activity.  Eating habits.  History of falls.  Memory and ability to understand (cognition).  Work and work Statistician.  Reproductive health. Screening  You may have the following tests or measurements:  Height, weight, and BMI.  Blood pressure.  Lipid and cholesterol levels. These may be checked every 5 years, or more frequently if you are over 60 years old.  Skin check.  Lung cancer screening. You may have this screening every year starting at age 71 if you have a 30-pack-year history of smoking and currently smoke or have quit within the past 15 years.  Fecal occult blood test (FOBT) of the stool. You may have this test every year starting at age 71.  Flexible sigmoidoscopy or colonoscopy. You may have a sigmoidoscopy every 5 years or a colonoscopy every 10 years starting at age 71.  Hepatitis  C blood test.  Hepatitis B blood test.  Sexually transmitted disease (STD) testing.  Diabetes screening. This is done by checking your blood sugar (glucose) after you have not eaten for a while (fasting). You may have this done every 1-3 years.  Bone density scan. This is done to screen for osteoporosis. You may have this done starting at age 71.  Mammogram. This may be done every 1-2 years.  Talk to your health care provider about how often you should have regular mammograms. Talk with your health care provider about your test results, treatment options, and if necessary, the need for more tests. Vaccines  Your health care provider may recommend certain vaccines, such as:  Influenza vaccine. This is recommended every year.  Tetanus, diphtheria, and acellular pertussis (Tdap, Td) vaccine. You may need a Td booster every 10 years.  Zoster vaccine. You may need this after age 70.  Pneumococcal 13-valent conjugate (PCV13) vaccine. One dose is recommended after age 70.  Pneumococcal polysaccharide (PPSV23) vaccine. One dose is recommended after age 71. Talk to your health care provider about which screenings and vaccines you need and how often you need them. This information is not intended to replace advice given to you by your health care provider. Make sure you discuss any questions you have with your health care provider. Document Released: 02/18/2015 Document Revised: 10/12/2015 Document Reviewed: 11/23/2014 Elsevier Interactive Patient Education  2017 [place] Prevention in the Home Falls can cause injuries. They can happen to people of all ages. There are many things you can do to make your home safe and to help prevent falls. What can I do on the outside of my home?  Regularly fix the edges of walkways and driveways and fix any cracks.  Remove anything that might make you trip as you walk through a door, such as a raised step or threshold.  Trim any bushes or trees on the path to your home.  Use bright outdoor lighting.  Clear any walking paths of anything that might make someone trip, such as rocks or tools.  Regularly check to see if handrails are loose or broken. Make sure that both sides of any steps have handrails.  Any raised decks and porches should have guardrails on the edges.  Have any leaves, snow, or ice cleared regularly.  Use sand or  salt on walking paths during winter.  Clean up any spills in your garage right away. This includes oil or grease spills. What can I do in the bathroom?  Use night lights.  Install grab bars by the toilet and in the tub and shower. Do not use towel bars as grab bars.  Use non-skid mats or decals in the tub or shower.  If you need to sit down in the shower, use a plastic, non-slip stool.  Keep the floor dry. Clean up any water that spills on the floor as soon as it happens.  Remove soap buildup in the tub or shower regularly.  Attach bath mats securely with double-sided non-slip rug tape.  Do not have throw rugs and other things on the floor that can make you trip. What can I do in the bedroom?  Use night lights.  Make sure that you have a light by your bed that is easy to reach.  Do not use any sheets or blankets that are too big for your bed. They should not hang down onto the floor.  Have a firm chair that has side arms.  You can use this for support while you get dressed.  Do not have throw rugs and other things on the floor that can make you trip. What can I do in the kitchen?  Clean up any spills right away.  Avoid walking on wet floors.  Keep items that you use a lot in easy-to-reach places.  If you need to reach something above you, use a strong step stool that has a grab bar.  Keep electrical cords out of the way.  Do not use floor polish or wax that makes floors slippery. If you must use wax, use non-skid floor wax.  Do not have throw rugs and other things on the floor that can make you trip. What can I do with my stairs?  Do not leave any items on the stairs.  Make sure that there are handrails on both sides of the stairs and use them. Fix handrails that are broken or loose. Make sure that handrails are as long as the stairways.  Check any carpeting to make sure that it is firmly attached to the stairs. Fix any carpet that is loose or worn.  Avoid having  throw rugs at the top or bottom of the stairs. If you do have throw rugs, attach them to the floor with carpet tape.  Make sure that you have a light switch at the top of the stairs and the bottom of the stairs. If you do not have them, ask someone to add them for you. What else can I do to help prevent falls?  Wear shoes that:  Do not have high heels.  Have rubber bottoms.  Are comfortable and fit you well.  Are closed at the toe. Do not wear sandals.  If you use a stepladder:  Make sure that it is fully opened. Do not climb a closed stepladder.  Make sure that both sides of the stepladder are locked into place.  Ask someone to hold it for you, if possible.  Clearly mark and make sure that you can see:  Any grab bars or handrails.  First and last steps.  Where the edge of each step is.  Use tools that help you move around (mobility aids) if they are needed. These include:  Canes.  Walkers.  Scooters.  Crutches.  Turn on the lights when you go into a dark area. Replace any light bulbs as soon as they burn out.  Set up your furniture so you have a clear path. Avoid moving your furniture around.  If any of your floors are uneven, fix them.  If there are any pets around you, be aware of where they are.  Review your medicines with your doctor. Some medicines can make you feel dizzy. This can increase your chance of falling. Ask your doctor what other things that you can do to help prevent falls. This information is not intended to replace advice given to you by your health care provider. Make sure you discuss any questions you have with your health care provider. Document Released: 11/18/2008 Document Revised: 06/30/2015 Document Reviewed: 02/26/2014 Elsevier Interactive Patient Education  2017 [employer].

## 2019-09-08 NOTE — Progress Notes (Signed)
Subjective:   Anne [name] is a 69 y.o. female who presents for Medicare Annual (Subsequent) preventive examination.  Review of Systems    No ROS.  Medicare Wellness Virtual Visit.   Cardiac Risk Factors include: advanced age (>12men, >24 women);hypertension     Objective:    Today's Vitals   09/08/19 1231  Weight: 188 lb (85.3 kg)  Height: 5\' 5"  (1.651 m)   Body mass index is 31.28 kg/m.  Advanced Directives 09/08/2019 08/28/2019 07/16/2019 06/25/2019 06/04/2019 05/13/2019 04/22/2019  Does Patient Have a Medical Advance Directive? No No No No No No No  Would patient like information on creating a medical advance directive? No - Patient declined - No - Patient declined No - Patient declined No - Patient declined - No - Patient declined    Current Medications (verified) Outpatient Encounter Medications as of 09/08/2019  Medication Sig  . buPROPion (WELLBUTRIN XL) 300 MG 24 hr tablet Take 1 tablet by mouth once daily  . escitalopram (LEXAPRO) 10 MG tablet Take 1 tablet by mouth once daily  . folic acid (FOLVITE) 1 MG tablet Take 1 tablet (1 mg total) by mouth daily.  Marland Kitchen ibuprofen (ADVIL,MOTRIN) 200 MG tablet Take 400 mg by mouth every 8 (eight) hours as needed for headache or moderate pain.   Marland Kitchen levothyroxine (SYNTHROID) 150 MCG tablet Take 1 tablet (150 mcg total) by mouth daily before breakfast.  . lidocaine-prilocaine (EMLA) cream Apply to affected area once  . loperamide (IMODIUM) 2 MG capsule Take 1 capsule (2 mg total) by mouth every 2 (two) hours as needed for diarrhea or loose stools. Do not exceed 16mg  daily.  . Multiple Vitamins-Minerals (PRESERVISION AREDS 2) CAPS Take 1 capsule by mouth 2 (two) times a day.   . polyethylene glycol (MIRALAX / GLYCOLAX) 17 g packet Take 17 g by mouth daily as needed for mild constipation.  . rosuvastatin (CRESTOR) 20 MG tablet Take 1 tablet by mouth once daily   Facility-Administered Encounter Medications as of 09/08/2019  Medication  .  denosumab (XGEVA) injection 120 mg    Allergies (verified) Bee venom   History: Past Medical History:  Diagnosis Date  . Arthritis   . Cirrhosis ([place])   . Depression   . GERD (gastroesophageal reflux disease)   . Hyperlipidemia   . Hypertension   . Lung cancer ([place])   . Metastatic bone cancer [facility])    Past Surgical History:  Procedure Laterality Date  . APPENDECTOMY  1971  . CHOLECYSTECTOMY  1971  . COLONOSCOPY WITH PROPOFOL N/A 11/15/2016   Procedure: COLONOSCOPY WITH PROPOFOL;  Surgeon: Jonathon Bellows, MD;  Location[facility] ENDOSCOPY;  Service: Gastroenterology;  Laterality: N/A;  . ENDOBRONCHIAL ULTRASOUND Right 07/30/2018   Procedure: ENDOBRONCHIAL ULTRASOUND;  Surgeon: Laverle Hobby, MD;  Location: [facility] ORS;  Service: Pulmonary;  Laterality: Right;  . ESOPHAGOGASTRODUODENOSCOPY (EGD) WITH PROPOFOL N/A 01/07/2017   Procedure: ESOPHAGOGASTRODUODENOSCOPY (EGD) WITH PROPOFOL;  Surgeon: Jonathon Bellows, MD;  Location: [facility] ENDOSCOPY;  Service: Gastroenterology;  Laterality: N/A;  . LAPAROSCOPY N/A 03/01/2017   Procedure: LAPAROSCOPY DIAGNOSTIC;  Surgeon: Robert Bellow, MD;  Location: [facility] ORS;  Service: General;  Laterality: N/A;  . PORTA CATH INSERTION N/A 08/14/2018   Procedure: PORTA CATH INSERTION;  Surgeon: Algernon Huxley, MD;  Location: [place] CV LAB;  Service: Cardiovascular;  Laterality: N/A;  . TONSILECTOMY, ADENOIDECTOMY, BILATERAL MYRINGOTOMY [place]  . TONSILLECTOMY    . VENTRAL HERNIA REPAIR N/A 03/01/2017   10 x 14 CM  Ventralight ST mesh, intraperitoneal location.   . VENTRAL HERNIA REPAIR N/A 03/01/2017   Procedure: HERNIA REPAIR VENTRAL ADULT;  Surgeon: Robert Bellow, MD;  Location: [facility] ORS;  Service: General;  Laterality: N/A;   Family History  Problem Relation Age of Onset  . Hypertension Mother   . Ovarian cancer Mother 50  . Heart disease Father   . Stroke Father   . Ovarian cancer Sister        ? dx cancer had hyst.  . Breast cancer  Neg Hx    Social History   Socioeconomic History  . Marital status: Married    Spouse name: [name]  . Number of children: Not on file  . Years of education: Not on file  . Highest education level: Not on file  Occupational History  . Not on file  Tobacco Use  . Smoking status: Former Smoker    Packs/day: 2.00    Years: 50.00    Pack years: 100.00    Types: Cigarettes, E-cigarettes    Quit date: 02/07/2012    Years since quitting: 7.5  . Smokeless tobacco: Never Used  Vaping Use  . Vaping Use: Every day  . Start date: 10/06/2013  . Devices: uses no liquid  Substance and Sexual Activity  . Alcohol use: No    Alcohol/week: 0.0 standard drinks  . Drug use: No  . Sexual activity: Yes  Other Topics Concern  . Not on file  Social History Narrative   Married   Retired   Clinical cytogeneticist level of education    No children    1 cup of coffee   Social Determinants of Radio broadcast assistant Strain: Low Risk   . Difficulty of Paying Living Expenses: Not hard at all  Food Insecurity: No Food Insecurity  . Worried About Charity fundraiser in the Last Year: Never true  . Ran Out of Food in the Last Year: Never true  Transportation Needs: No Transportation Needs  . Lack of Transportation (Medical): No  . Lack of Transportation (Non-Medical): No  Physical Activity: Unknown  . Days of Exercise per Week: Patient refused  . Minutes of Exercise per Session: Patient refused  Stress: No Stress Concern Present  . Feeling of Stress : Not at all  Social Connections: Unknown  . Frequency of Communication with Friends and Family: Patient refused  . Frequency of Social Gatherings with Friends and Family: Patient refused  . Attends Religious Services: Patient refused  . Active Member of Clubs or Organizations: Patient refused  . Attends Archivist Meetings: Patient refused  . Marital Status: Patient refused    Tobacco Counseling Counseling given: Not  Answered   Clinical Intake:  Pre-visit preparation completed: Yes        Diabetes: No  How often do you need to have someone help you when you read instructions, pamphlets, or other written materials from your doctor or pharmacy?: 1 - Never Interpreter Needed?: No      Activities of Daily Living In your present state of health, do you have any difficulty performing the following activities: 09/08/2019  Hearing? N  Vision? N  Difficulty concentrating or making decisions? N  Walking or climbing stairs? N  Dressing or bathing? N  Doing errands, shopping? N  Preparing Food and eating ? N  Using the Toilet? N  In the past six months, have you accidently leaked urine? N  Do you have problems with loss of bowel control? N  Managing your Medications? N  Managing your Finances? N  Housekeeping or managing your Housekeeping? N  Some recent data might be hidden    Patient Care Team: Leone Haven, MD as PCP - General (Family Medicine) Leone Haven, MD as Consulting Physician (Family Medicine) Bary Castilla, Forest Gleason, MD (General Surgery) Telford Nab, RN as Registered Nurse  Indicate any recent [place] you may have received from other than [facility] providers in the past year (date may be approximate).     Assessment:   This is a routine wellness examination for [place].  I connected with [place] today by telephone and verified that I am speaking with the correct person using two identifiers. Location patient: home Location provider: work Persons participating in the virtual visit: patient, Marine scientist.    I discussed the limitations, risks, security and privacy concerns of performing an evaluation and management service by telephone and the availability of in person appointments. The patient expressed understanding and verbally consented to this telephonic visit.    Interactive audio and video telecommunications were attempted between this provider and patient, however  failed, due to patient having technical difficulties OR patient did not have access to video capability.  We continued and completed visit with audio only.  Some vital signs may be absent or patient reported.   Hearing/Vision screen  Hearing Screening   125Hz  250Hz  500Hz  1000Hz  2000Hz  3000Hz  4000Hz  6000Hz  8000Hz   Right ear:           Left ear:           Comments: Difficulty hearing some conversational tones.  Hearing aids not worn all the time.   Vision Screening Comments: Wears corrective lenses Visual acuity not assessed, virtual visit.  They have seen their ophthalmologist in the last 12 months.     Dietary issues and exercise activities discussed: Current Exercise Habits: The patient does not participate in regular exercise at present, Intensity: Mild  Low cholesterol diet Good fluid intake  Goals      Patient Stated   .  DIET - Healthy diet (pt-stated)      Depression Screen PHQ 2/9 Scores 09/08/2019 04/20/2019 02/16/2019 09/05/2018 02/26/2018 02/26/2018 11/20/2017  PHQ - 2 Score 0 0 0 0 2 2 1   PHQ- 9 Score - - - - 6 - -    Fall Risk Fall Risk  09/08/2019 02/12/2019 10/10/2018 09/10/2018 09/05/2018  Falls in the past year? 0 1 1 1  0  Number falls in past yr: 0 0 0 0 0  Injury with Fall? - 0 0 0 -  Follow up Falls evaluation completed - - - -   Handrails in use when climbing stairs? Yes  Home free of loose throw rugs in walkways, pet beds, electrical cords, etc? Yes  Adequate lighting in your home to reduce risk of falls? Yes   ASSISTIVE DEVICES UTILIZED TO PREVENT FALLS:  Life alert? No  Use of a cane, walker or w/c? No  Grab bars in the bathroom? Yes  Shower chair or bench in shower? No  Elevated toilet seat or a handicapped toilet? No   TIMED UP AND GO:  Was the test performed? No . Virtual visit.  Cognitive Function: MMSE - Mini Mental State Exam 06/04/2017  Orientation to time 5  Orientation to Place 5  Registration 3  Attention/ Calculation 5  Recall 0   Language- name 2 objects 2  Language- repeat 1  Language- follow 3 step command 3  Language- read & follow direction  1  Write a sentence 1  Copy design 1  Total score 27     6CIT Screen 09/08/2019 09/05/2018 06/01/2016  What Year? 0 points 0 points 0 points  What month? 0 points 0 points 0 points  What time? - 0 points 0 points  Count back from 20 0 points 0 points 0 points  Months in reverse - 0 points 0 points  Repeat phrase - - 0 points  Total Score - - 0    Immunizations Immunization History  Administered Date(s) Administered  . Influenza, High Dose Seasonal PF 11/02/2016, 09/22/2017, 10/26/2017, 10/16/2018  . Influenza-Unspecified 10/15/2014, 10/29/2015, 11/01/2016  . Pneumococcal Conjugate-13 11/13/2013  . Pneumococcal Polysaccharide-23 11/30/2014  . Td 12/06/2017  . Zoster 11/15/2014    Health Maintenance Health Maintenance  Topic Date Due  . COVID-19 Vaccine (1) Never done  . INFLUENZA VACCINE  09/06/2019  . MAMMOGRAM  09/07/2020 (Originally 03/08/2017)  . COLONOSCOPY  11/15/2021  . TETANUS/TDAP  12/07/2027  . DEXA SCAN  Completed  . Hepatitis C Screening  Completed  . PNA vac Low Risk Adult  Completed   Covid vaccine -completed. Agrees to update immunization record later next office visit.   Mammogram- declined.  Dental Screening: Recommended annual dental exams for proper oral hygiene  Community Resource Referral / Chronic Care Management: CRR required this visit?  No   CCM required this visit?  No      Plan:   Keep all routine maintenance appointments.   Follow up 01/26/20 @ 12:00  I have personally reviewed and noted the following in the patient's chart:   . Medical and social history . Use of alcohol, tobacco or illicit drugs  . Current medications and supplements . Functional ability and status . Nutritional status . Physical activity . Advanced directives . List of other physicians . Hospitalizations, surgeries, and ER visits in  previous 12 months . Vitals . Screenings to include cognitive, depression, and falls . Referrals and appointments  In addition, I have reviewed and discussed with patient certain preventive protocols, quality metrics, and best practice recommendations. A written personalized care plan for preventive services as well as general preventive health recommendations were provided to patient via mychart.     Varney Biles, LPN   08/11/8240

## 2019-09-10 ENCOUNTER — Other Ambulatory Visit: Payer: Self-pay | Admitting: Oncology

## 2019-09-17 ENCOUNTER — Other Ambulatory Visit: Payer: Self-pay | Admitting: Family Medicine

## 2019-09-18 ENCOUNTER — Inpatient Hospital Stay (HOSPITAL_BASED_OUTPATIENT_CLINIC_OR_DEPARTMENT_OTHER): Payer: PPO | Admitting: Oncology

## 2019-09-18 ENCOUNTER — Encounter: Payer: Self-pay | Admitting: Oncology

## 2019-09-18 ENCOUNTER — Inpatient Hospital Stay: Payer: PPO | Attending: Oncology

## 2019-09-18 ENCOUNTER — Other Ambulatory Visit: Payer: Self-pay

## 2019-09-18 ENCOUNTER — Inpatient Hospital Stay: Payer: PPO

## 2019-09-18 VITALS — BP 137/76 | HR 72 | Temp 98.3°F | Resp 16 | Ht 65.0 in | Wt 188.7 lb

## 2019-09-18 DIAGNOSIS — C3411 Malignant neoplasm of upper lobe, right bronchus or lung: Secondary | ICD-10-CM | POA: Insufficient documentation

## 2019-09-18 DIAGNOSIS — C7951 Secondary malignant neoplasm of bone: Secondary | ICD-10-CM | POA: Insufficient documentation

## 2019-09-18 DIAGNOSIS — Z923 Personal history of irradiation: Secondary | ICD-10-CM | POA: Diagnosis not present

## 2019-09-18 DIAGNOSIS — Z8041 Family history of malignant neoplasm of ovary: Secondary | ICD-10-CM | POA: Diagnosis not present

## 2019-09-18 DIAGNOSIS — E669 Obesity, unspecified: Secondary | ICD-10-CM | POA: Diagnosis not present

## 2019-09-18 DIAGNOSIS — Z5112 Encounter for antineoplastic immunotherapy: Secondary | ICD-10-CM | POA: Diagnosis not present

## 2019-09-18 DIAGNOSIS — K7460 Unspecified cirrhosis of liver: Secondary | ICD-10-CM | POA: Diagnosis not present

## 2019-09-18 DIAGNOSIS — E063 Autoimmune thyroiditis: Secondary | ICD-10-CM

## 2019-09-18 DIAGNOSIS — I10 Essential (primary) hypertension: Secondary | ICD-10-CM | POA: Diagnosis not present

## 2019-09-18 DIAGNOSIS — Z87891 Personal history of nicotine dependence: Secondary | ICD-10-CM | POA: Diagnosis not present

## 2019-09-18 DIAGNOSIS — Z9049 Acquired absence of other specified parts of digestive tract: Secondary | ICD-10-CM | POA: Insufficient documentation

## 2019-09-18 LAB — CBC WITH DIFFERENTIAL/PLATELET
Abs Immature Granulocytes: 0.02 10*3/uL (ref 0.00–0.07)
Basophils Absolute: 0 10*3/uL (ref 0.0–0.1)
Basophils Relative: 1 %
Eosinophils Absolute: 0.1 10*3/uL (ref 0.0–0.5)
Eosinophils Relative: 3 %
HCT: 34.8 % — ABNORMAL LOW (ref 36.0–46.0)
Hemoglobin: 11.3 g/dL — ABNORMAL LOW (ref 12.0–15.0)
Immature Granulocytes: 1 %
Lymphocytes Relative: 17 %
Lymphs Abs: 0.7 10*3/uL (ref 0.7–4.0)
MCH: 28.9 pg (ref 26.0–34.0)
MCHC: 32.5 g/dL (ref 30.0–36.0)
MCV: 89 fL (ref 80.0–100.0)
Monocytes Absolute: 0.3 10*3/uL (ref 0.1–1.0)
Monocytes Relative: 7 %
Neutro Abs: 3 10*3/uL (ref 1.7–7.7)
Neutrophils Relative %: 71 %
Platelets: 135 10*3/uL — ABNORMAL LOW (ref 150–400)
RBC: 3.91 MIL/uL (ref 3.87–5.11)
RDW: 15.9 % — ABNORMAL HIGH (ref 11.5–15.5)
WBC: 4.2 10*3/uL (ref 4.0–10.5)
nRBC: 0 % (ref 0.0–0.2)

## 2019-09-18 LAB — COMPREHENSIVE METABOLIC PANEL
ALT: 18 U/L (ref 0–44)
AST: 28 U/L (ref 15–41)
Albumin: 4 g/dL (ref 3.5–5.0)
Alkaline Phosphatase: 59 U/L (ref 38–126)
Anion gap: 13 (ref 5–15)
BUN: 20 mg/dL (ref 8–23)
CO2: 29 mmol/L (ref 22–32)
Calcium: 9 mg/dL (ref 8.9–10.3)
Chloride: 100 mmol/L (ref 98–111)
Creatinine, Ser: 0.96 mg/dL (ref 0.44–1.00)
GFR calc Af Amer: >60 mL/min (ref >60)
GFR calc non Af Amer: 60 mL/min — ABNORMAL LOW (ref >60)
Glucose, Bld: 92 mg/dL (ref 70–99)
Potassium: 3.7 mmol/L (ref 3.5–5.1)
Sodium: 142 mmol/L (ref 135–145)
Total Bilirubin: 0.7 mg/dL (ref 0.3–1.2)
Total Protein: 7.1 g/dL (ref 6.5–8.1)

## 2019-09-18 LAB — TSH: TSH: 4.06 u[IU]/mL (ref 0.350–4.500)

## 2019-09-18 MED ORDER — SODIUM CHLORIDE 0.9 % IV SOLN
1200.0000 mg | Freq: Once | INTRAVENOUS | Status: AC
  Administered 2019-09-18: 1200 mg via INTRAVENOUS
  Filled 2019-09-18: qty 20

## 2019-09-18 MED ORDER — SODIUM CHLORIDE 0.9% FLUSH
10.0000 mL | Freq: Once | INTRAVENOUS | Status: AC
  Administered 2019-09-18: 10 mL via INTRAVENOUS
  Filled 2019-09-18: qty 10

## 2019-09-18 MED ORDER — SODIUM CHLORIDE 0.9 % IV SOLN
Freq: Once | INTRAVENOUS | Status: AC
  Filled 2019-09-18: qty 250

## 2019-09-18 MED ORDER — HEPARIN SOD (PORK) LOCK FLUSH 100 UNIT/ML IV SOLN
500.0000 [IU] | Freq: Once | INTRAVENOUS | Status: AC
  Administered 2019-09-18: 500 [IU] via INTRAVENOUS
  Filled 2019-09-18: qty 5

## 2019-09-18 MED ORDER — HEPARIN SOD (PORK) LOCK FLUSH 100 UNIT/ML IV SOLN
INTRAVENOUS | Status: AC
  Filled 2019-09-18: qty 5

## 2019-09-18 NOTE — Progress Notes (Signed)
Pt doing well, no c/o she drinks lots of tea, eats good. No pain, sleeps good. Bowels work good every day

## 2019-09-20 NOTE — Progress Notes (Signed)
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 She has been referred to [place] for findings of bone metastases and her recent MRI. She has a prior history of 3 packs/day day smoking for over 45 years and quit smoking 5 years ago.She had a CT chest abdomen pelvis in 2018 which showed a 5 mm lung nodule in the left lower lobe. Recently over the last 2 months patient has been having worsening back pain and was referred to orthopedics. 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 Pathologic fracture of S1.  PET scan showed 2 RUL lung nodules, hilar and mediastinal adenopathy and widespread bone mets. MRI brain negative.  Patientcompletedpalliative RT to her spine. Bronchoscopy showed small cell lung cancer. Palliative carboplatin, etoposide and Tecentriq started on 08/18/2018. Scans after 4 cycles showed stable disease. She is on maintenance tecentriq  Interval history-patient reports doing well and denies any complaints at this time.  Appetite and weight have remained stable.  Denies any back pain.  Reports she is compliant with  levothyroxine.  ECOG PS- 1 Pain scale- 0   Review of systems- Review of Systems  Constitutional: Negative for chills, fever, malaise/fatigue and weight loss.  HENT: Negative for congestion, ear discharge and nosebleeds.   Eyes: Negative for blurred vision.  Respiratory: Negative for cough, hemoptysis, sputum production, shortness of breath and wheezing.   Cardiovascular: Negative for chest pain, palpitations, orthopnea and claudication.  Gastrointestinal: Negative for abdominal pain, blood in stool, constipation, diarrhea, heartburn, melena, nausea and vomiting.  Genitourinary: Negative for dysuria, flank pain, frequency, hematuria and urgency.  Musculoskeletal: Negative for back pain, joint pain and myalgias.  Skin: Negative for rash.  Neurological: Negative for dizziness, tingling, focal weakness, seizures, weakness and headaches.  Endo/Heme/Allergies: Does not bruise/bleed easily.  Psychiatric/Behavioral: Negative for depression and suicidal ideas. The patient does not have insomnia.       Allergies  Allergen Reactions  . Bee Venom Swelling     Past Medical History:  Diagnosis Date  . Arthritis   . Cirrhosis ([place])   . Depression   . GERD (gastroesophageal reflux disease)   . Hyperlipidemia   . Hypertension   . Lung cancer ([place])   . Metastatic bone cancer [facility])      Past Surgical History:  Procedure Laterality Date  . APPENDECTOMY  1971  . CHOLECYSTECTOMY  1971  . COLONOSCOPY WITH PROPOFOL N/A 11/15/2016   Procedure: COLONOSCOPY WITH PROPOFOL;  Surgeon: Jonathon Bellows, MD;  Location: [facility] ENDOSCOPY;  Service: Gastroenterology;  Laterality: N/A;  . ENDOBRONCHIAL ULTRASOUND Right 07/30/2018   Procedure: ENDOBRONCHIAL ULTRASOUND;  Surgeon: Laverle Hobby, MD;  Location: [facility] ORS;  Service: Pulmonary;  Laterality: Right;  . ESOPHAGOGASTRODUODENOSCOPY (EGD) WITH PROPOFOL N/A 01/07/2017  Procedure: ESOPHAGOGASTRODUODENOSCOPY (EGD) WITH PROPOFOL;  Surgeon: Jonathon Bellows,  MD;  Location: [facility] ENDOSCOPY;  Service: Gastroenterology;  Laterality: N/A;  . LAPAROSCOPY N/A 03/01/2017   Procedure: LAPAROSCOPY DIAGNOSTIC;  Surgeon: Robert Bellow, MD;  Location: [facility] ORS;  Service: General;  Laterality: N/A;  . PORTA CATH INSERTION N/A 08/14/2018   Procedure: PORTA CATH INSERTION;  Surgeon: Algernon Huxley, MD;  Location: [place] CV LAB;  Service: Cardiovascular;  Laterality: N/A;  . TONSILECTOMY, ADENOIDECTOMY, BILATERAL MYRINGOTOMY Chloride  . TONSILLECTOMY    . VENTRAL HERNIA REPAIR N/A 03/01/2017   10 x 14 CM Ventralight ST mesh, intraperitoneal location.   . VENTRAL HERNIA REPAIR N/A 03/01/2017   Procedure: HERNIA REPAIR VENTRAL ADULT;  Surgeon: Robert Bellow, MD;  Location: [facility] ORS;  Service: General;  Laterality: N/A;    Social History   Socioeconomic History  . Marital status: Married    Spouse name: [name]  . Number of children: Not on file  . Years of education: Not on file  . Highest education level: Not on file  Occupational History  . Not on file  Tobacco Use  . Smoking status: Former Smoker    Packs/day: 2.00    Years: 50.00    Pack years: 100.00    Types: Cigarettes, E-cigarettes    Quit date: 02/07/2012    Years since quitting: 7.6  . Smokeless tobacco: Never Used  Vaping Use  . Vaping Use: Some days  . Start date: 10/06/2013  . Devices: uses no liquid  Substance and Sexual Activity  . Alcohol use: No    Alcohol/week: 0.0 standard drinks  . Drug use: No  . Sexual activity: Yes  Other Topics Concern  . Not on file  Social History Narrative   Married   Retired   Clinical cytogeneticist level of education    No children    1 cup of coffee   Social Determinants of Radio broadcast assistant Strain: Low Risk   . Difficulty of Paying Living Expenses: Not hard at all  Food Insecurity: No Food Insecurity  . Worried About Charity fundraiser in the Last Year: Never true  . Ran Out of Food in the Last Year: Never true    Transportation Needs: No Transportation Needs  . Lack of Transportation (Medical): No  . Lack of Transportation (Non-Medical): No  Physical Activity: Unknown  . Days of Exercise per Week: Patient refused  . Minutes of Exercise per Session: Patient refused  Stress: No Stress Concern Present  . Feeling of Stress : Not at all  Social Connections: Unknown  . Frequency of Communication with Friends and Family: Patient refused  . Frequency of Social Gatherings with Friends and Family: Patient refused  . Attends Religious Services: Patient refused  . Active Member of Clubs or Organizations: Patient refused  . Attends Archivist Meetings: Patient refused  . Marital Status: Patient refused  Intimate Partner Violence: Not At Risk  . Fear of Current or Ex-Partner: No  . Emotionally Abused: No  . Physically Abused: No  . Sexually Abused: No    Family History  Problem Relation Age of Onset  . Hypertension Mother   . Ovarian cancer Mother 81  . Heart disease Father   . Stroke Father   . Ovarian cancer Sister        ? dx cancer had hyst.  . Breast cancer Neg Hx      Current Outpatient Medications:  .  buPROPion (WELLBUTRIN XL) 300 MG 24 hr tablet, Take 1 tablet by mouth once daily, Disp: 90 tablet, Rfl: 0 .  escitalopram (LEXAPRO) 10 MG tablet, Take 1 tablet by mouth once daily, Disp: 90 tablet, Rfl: 0 .  folic acid (FOLVITE) 1 MG tablet, Take 1 tablet (1 mg total) by mouth daily., Disp: 90 tablet, Rfl: 1 .  ibuprofen (ADVIL,MOTRIN) 200 MG tablet, Take 400 mg by mouth every 8 (eight) hours as needed for headache or moderate pain. , Disp: , Rfl:  .  levothyroxine (SYNTHROID) 150 MCG tablet, Take 1 tablet (150 mcg total) by mouth daily before breakfast., Disp: 30 tablet, Rfl: 3 .  lidocaine-prilocaine (EMLA) cream, Apply to affected area once, Disp: 30 g, Rfl: 3 .  loperamide (IMODIUM) 2 MG capsule, Take 1 capsule (2 mg total) by mouth every 2 (two) hours as needed for diarrhea or  loose stools. Do not exceed 16mg  daily., Disp: 30 capsule, Rfl: 1 .  Multiple Vitamins-Minerals (PRESERVISION AREDS 2) CAPS, Take 1 capsule by mouth 2 (two) times a day. , Disp: , Rfl:  .  polyethylene glycol (MIRALAX / GLYCOLAX) 17 g packet, Take 17 g by mouth daily as needed for mild constipation., Disp: 14 each, Rfl: 0 .  rosuvastatin (CRESTOR) 20 MG tablet, Take 1 tablet by mouth once daily, Disp: 90 tablet, Rfl: 0 No current facility-administered medications for this visit.  Facility-Administered Medications Ordered in Other Visits:  .  denosumab (XGEVA) injection 120 mg, 120 mg, Subcutaneous, Q30 days, Sindy Guadeloupe, MD, 120 mg at 03/05/19 1525  Physical exam:  Vitals:   09/18/19 1037  BP: 137/76  Pulse: 72  Resp: 16  Temp: 98.3 F (36.8 C)  TempSrc: Oral  Weight: 188 lb 11.2 oz (85.6 kg)  Height: 5\' 5"  (1.651 m)   Physical Exam Constitutional:      General: She is not in acute distress. Cardiovascular:     Rate and Rhythm: Normal rate and regular rhythm.     Heart sounds: Normal heart sounds.  Pulmonary:     Effort: Pulmonary effort is normal.     Breath sounds: Normal breath sounds.  Abdominal:     General: Bowel sounds are normal.     Palpations: Abdomen is soft.  Skin:    General: Skin is warm and dry.  Neurological:     Mental Status: She is alert and oriented to person, place, and time.      CMP Latest Ref Rng & Units 09/18/2019  Glucose 70 - 99 mg/dL 92  BUN 8 - 23 mg/dL 20  Creatinine 0.44 - 1.00 mg/dL 0.96  Sodium 135 - 145 mmol/L 142  Potassium 3.5 - 5.1 mmol/L 3.7  Chloride 98 - 111 mmol/L 100  CO2 22 - 32 mmol/L 29  Calcium 8.9 - 10.3 mg/dL 9.0  Total Protein 6.5 - 8.1 g/dL 7.1  Total Bilirubin 0.3 - 1.2 mg/dL 0.7  Alkaline Phos 38 - 126 U/L 59  AST 15 - 41 U/L 28  ALT 0 - 44 U/L 18   CBC Latest Ref Rng & Units 09/18/2019  WBC 4.0 - 10.5 K/uL 4.2  Hemoglobin 12.0 - 15.0 g/dL 11.3(L)  Hematocrit 36 - 46 % 34.8(L)  Platelets 150 - 400 K/uL  135(L)    No images are attached to the encounter.  CT Chest W Contrast  Result Date: 09/07/2019 CLINICAL DATA:  Small cell lung cancer with bone metastases. EXAM: CT CHEST, ABDOMEN, AND PELVIS WITH CONTRAST TECHNIQUE: Multidetector CT  imaging of the chest, abdomen and pelvis was performed following the standard protocol during bolus administration of intravenous contrast. CONTRAST:  167mL OMNIPAQUE IOHEXOL 300 MG/ML  SOLN COMPARISON:  June 01, 2019 FINDINGS: CT CHEST FINDINGS Cardiovascular: Calcified and noncalcified atheromatous plaque in the thoracic aorta without aneurysmal dilation. Three-vessel coronary artery disease. Heart size is normal without pericardial effusion. Central pulmonary vasculature on venous phase assessment is unremarkable. Mediastinum/Nodes: RIGHT IJ Port-A-Cath terminates at the caval to atrial junction. No thoracic inlet adenopathy. No axillary lymphadenopathy. No mediastinal lymphadenopathy. Scattered small lymph nodes are unchanged compared to the prior study. No hilar adenopathy. Lungs/Pleura: No consolidation. No pleural effusion. No new suspicious mass or nodule. 5 mm RIGHT upper lobe pulmonary nodule is unchanged. (Image 45 series 506 airways are patent. Musculoskeletal: See below for full musculoskeletal details. No chest wall mass. CT ABDOMEN PELVIS FINDINGS Hepatobiliary: Lobular hepatic contours compatible with cirrhotic morphology with hepatic enlargement and likely with some background steatosis with similar appearance. Portal vein is patent. Post cholecystectomy without significant biliary duct dilation. Pancreas: No pancreatic ductal dilation, inflammation or focal lesion. Spleen: Spleen mildly enlarged approximately 12 cm greatest craniocaudal span. Adrenals/Urinary Tract: Adrenal glands are normal. Kidneys enhance symmetrically. No signs of hydronephrosis. Signs of pelvic floor dysfunction with saddle type appearance of the urinary bladder. Stomach/Bowel: No acute  gastrointestinal process. Vascular/Lymphatic: Calcified atheromatous plaque of the abdominal aorta. No aneurysmal dilation. Splenic and hepatic arteries at the celiac origin. Reproductive: Post hysterectomy.  No pelvic lymphadenopathy. Other: Thinning of RIGHT rectus musculature likely related to prior cholecystectomy. Musculoskeletal: Signs of multifocal osseous metastatic disease with spine pelvic and rib involvement without change. Loss of height of the T2, L1 and L2 vertebral bodies show a similar appearance. Destructive changes in the sacrum are also similar. Grade 1/2 anterolisthesis of L4 on L5 is unchanged. IMPRESSION: Unchanged appearance of multifocal bony metastatic disease with areas of bone destruction and sclerosis. Stable small RIGHT upper lobe pulmonary nodule. No signs of new disease in the chest, abdomen or pelvis. Ectatic infrarenal abdominal aorta at 2.5 x 2.5 cm. Ectatic abdominal aorta at risk for aneurysm development. Recommend followup by ultrasound in 5 years. This recommendation follows ACR consensus guidelines: White Paper of the ACR Incidental Findings Committee II on Vascular Findings. J Am Coll Radiol 2013; 10:789-794. Aortic aneurysm NOS (ICD10-I71.9) Hepatic cirrhosis and findings of mild portal hypertension similar to the prior exam. Electronically Signed   By: Zetta Bills M.D.   On: 09/07/2019 16:15   NM Bone Scan Whole Body  Result Date: 09/08/2019 CLINICAL DATA:  Small-cell lung cancer with bone metastasis. EXAM: NUCLEAR MEDICINE WHOLE BODY BONE SCAN TECHNIQUE: Whole body anterior and posterior images were obtained approximately 3 hours after intravenous injection of radiopharmaceutical. RADIOPHARMACEUTICALS:  22.3 mCi Technetium-75m MDP IV COMPARISON:  CT 09/07/2019.  Bone scan 06/03/2019. FINDINGS: Bilateral renal function excretion. Bilateral humeral and femoral areas of increased activity again noted. Bilateral multifocal rib increased activity. Diffuse thoracolumbar  focal areas of increased activity again noted. Right ischial increased activity again noted. These findings are again consistent with multifocal metastatic disease and are stable in appearance from prior exam. IMPRESSION: Stable changes of diffuse multifocal metastatic disease. Electronically Signed   By: Marcello Moores  Register   On: 09/08/2019 05:28   CT ABDOMEN PELVIS W CONTRAST  Result Date: 09/07/2019 CLINICAL DATA:  Small cell lung cancer with bone metastases. EXAM: CT CHEST, ABDOMEN, AND PELVIS WITH CONTRAST TECHNIQUE: Multidetector CT imaging of the chest, abdomen and pelvis was performed  following the standard protocol during bolus administration of intravenous contrast. CONTRAST:  154mL OMNIPAQUE IOHEXOL 300 MG/ML  SOLN COMPARISON:  June 01, 2019 FINDINGS: CT CHEST FINDINGS Cardiovascular: Calcified and noncalcified atheromatous plaque in the thoracic aorta without aneurysmal dilation. Three-vessel coronary artery disease. Heart size is normal without pericardial effusion. Central pulmonary vasculature on venous phase assessment is unremarkable. Mediastinum/Nodes: RIGHT IJ Port-A-Cath terminates at the caval to atrial junction. No thoracic inlet adenopathy. No axillary lymphadenopathy. No mediastinal lymphadenopathy. Scattered small lymph nodes are unchanged compared to the prior study. No hilar adenopathy. Lungs/Pleura: No consolidation. No pleural effusion. No new suspicious mass or nodule. 5 mm RIGHT upper lobe pulmonary nodule is unchanged. (Image 45 series 506 airways are patent. Musculoskeletal: See below for full musculoskeletal details. No chest wall mass. CT ABDOMEN PELVIS FINDINGS Hepatobiliary: Lobular hepatic contours compatible with cirrhotic morphology with hepatic enlargement and likely with some background steatosis with similar appearance. Portal vein is patent. Post cholecystectomy without significant biliary duct dilation. Pancreas: No pancreatic ductal dilation, inflammation or focal  lesion. Spleen: Spleen mildly enlarged approximately 12 cm greatest craniocaudal span. Adrenals/Urinary Tract: Adrenal glands are normal. Kidneys enhance symmetrically. No signs of hydronephrosis. Signs of pelvic floor dysfunction with saddle type appearance of the urinary bladder. Stomach/Bowel: No acute gastrointestinal process. Vascular/Lymphatic: Calcified atheromatous plaque of the abdominal aorta. No aneurysmal dilation. Splenic and hepatic arteries at the celiac origin. Reproductive: Post hysterectomy.  No pelvic lymphadenopathy. Other: Thinning of RIGHT rectus musculature likely related to prior cholecystectomy. Musculoskeletal: Signs of multifocal osseous metastatic disease with spine pelvic and rib involvement without change. Loss of height of the T2, L1 and L2 vertebral bodies show a similar appearance. Destructive changes in the sacrum are also similar. Grade 1/2 anterolisthesis of L4 on L5 is unchanged. IMPRESSION: Unchanged appearance of multifocal bony metastatic disease with areas of bone destruction and sclerosis. Stable small RIGHT upper lobe pulmonary nodule. No signs of new disease in the chest, abdomen or pelvis. Ectatic infrarenal abdominal aorta at 2.5 x 2.5 cm. Ectatic abdominal aorta at risk for aneurysm development. Recommend followup by ultrasound in 5 years. This recommendation follows ACR consensus guidelines: White Paper of the ACR Incidental Findings Committee II on Vascular Findings. J Am Coll Radiol 2013; 10:789-794. Aortic aneurysm NOS (ICD10-I71.9) Hepatic cirrhosis and findings of mild portal hypertension similar to the prior exam. Electronically Signed   By: Zetta Bills M.D.   On: 09/07/2019 16:15     Assessment and plan- Patient is a 68 y.o. female with extensive stage small cell lung cancer and bone metastases.She is here for next cycle of maintenance Tecentriq  Counts okay to proceed with cycle 13 of maintenance Tecentriq today.  She will continue this until  progression or toxicity and I will see her back in 3 weeks for cycle 16.  I reviewed CT chest abdomen pelvis as well as bone scan images independently.Overall scans show stable disease and no evidence of progression.  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 TSH was elevated 3 weeks ago at 18 but on repeat check today it is again normal at 4.06.  Bone metastases: Delton See is on hold as patient developed significant hypocalcemia following that requiring IV calcium for a prolonged period of time   Visit Diagnosis 1. Small cell lung cancer, right upper lobe ([place])   2. Encounter for antineoplastic immunotherapy   3. Bone metastases ([facility])      Dr. Randa Evens, MD, MPH [place] [facility]  [facility] [phone] 09/20/2019 3:37 PM

## 2019-10-09 ENCOUNTER — Inpatient Hospital Stay: Payer: PPO | Attending: Oncology

## 2019-10-09 ENCOUNTER — Other Ambulatory Visit: Payer: Self-pay

## 2019-10-09 ENCOUNTER — Encounter: Payer: Self-pay | Admitting: Oncology

## 2019-10-09 ENCOUNTER — Inpatient Hospital Stay (HOSPITAL_BASED_OUTPATIENT_CLINIC_OR_DEPARTMENT_OTHER): Payer: PPO | Admitting: Oncology

## 2019-10-09 ENCOUNTER — Inpatient Hospital Stay: Payer: PPO

## 2019-10-09 VITALS — BP 129/73 | HR 72 | Temp 97.4°F | Resp 18 | Ht 65.0 in | Wt 188.9 lb

## 2019-10-09 DIAGNOSIS — Z923 Personal history of irradiation: Secondary | ICD-10-CM | POA: Diagnosis not present

## 2019-10-09 DIAGNOSIS — C3411 Malignant neoplasm of upper lobe, right bronchus or lung: Secondary | ICD-10-CM | POA: Diagnosis not present

## 2019-10-09 DIAGNOSIS — Z8041 Family history of malignant neoplasm of ovary: Secondary | ICD-10-CM | POA: Diagnosis not present

## 2019-10-09 DIAGNOSIS — E669 Obesity, unspecified: Secondary | ICD-10-CM | POA: Diagnosis not present

## 2019-10-09 DIAGNOSIS — E063 Autoimmune thyroiditis: Secondary | ICD-10-CM | POA: Diagnosis not present

## 2019-10-09 DIAGNOSIS — K7460 Unspecified cirrhosis of liver: Secondary | ICD-10-CM | POA: Insufficient documentation

## 2019-10-09 DIAGNOSIS — Z9049 Acquired absence of other specified parts of digestive tract: Secondary | ICD-10-CM | POA: Insufficient documentation

## 2019-10-09 DIAGNOSIS — Z87891 Personal history of nicotine dependence: Secondary | ICD-10-CM | POA: Diagnosis not present

## 2019-10-09 DIAGNOSIS — Z5112 Encounter for antineoplastic immunotherapy: Secondary | ICD-10-CM | POA: Diagnosis not present

## 2019-10-09 DIAGNOSIS — C7951 Secondary malignant neoplasm of bone: Secondary | ICD-10-CM | POA: Insufficient documentation

## 2019-10-09 LAB — CBC WITH DIFFERENTIAL/PLATELET
Abs Immature Granulocytes: 0.01 10*3/uL (ref 0.00–0.07)
Basophils Absolute: 0 10*3/uL (ref 0.0–0.1)
Basophils Relative: 1 %
Eosinophils Absolute: 0.2 10*3/uL (ref 0.0–0.5)
Eosinophils Relative: 4 %
HCT: 34 % — ABNORMAL LOW (ref 36.0–46.0)
Hemoglobin: 11.1 g/dL — ABNORMAL LOW (ref 12.0–15.0)
Immature Granulocytes: 0 %
Lymphocytes Relative: 16 %
Lymphs Abs: 0.7 10*3/uL (ref 0.7–4.0)
MCH: 28.4 pg (ref 26.0–34.0)
MCHC: 32.6 g/dL (ref 30.0–36.0)
MCV: 87 fL (ref 80.0–100.0)
Monocytes Absolute: 0.3 10*3/uL (ref 0.1–1.0)
Monocytes Relative: 7 %
Neutro Abs: 3.1 10*3/uL (ref 1.7–7.7)
Neutrophils Relative %: 72 %
Platelets: 151 10*3/uL (ref 150–400)
RBC: 3.91 MIL/uL (ref 3.87–5.11)
RDW: 14.9 % (ref 11.5–15.5)
WBC: 4.4 10*3/uL (ref 4.0–10.5)
nRBC: 0 % (ref 0.0–0.2)

## 2019-10-09 LAB — COMPREHENSIVE METABOLIC PANEL
ALT: 14 U/L (ref 0–44)
AST: 22 U/L (ref 15–41)
Albumin: 3.6 g/dL (ref 3.5–5.0)
Alkaline Phosphatase: 66 U/L (ref 38–126)
Anion gap: 11 (ref 5–15)
BUN: 15 mg/dL (ref 8–23)
CO2: 30 mmol/L (ref 22–32)
Calcium: 9 mg/dL (ref 8.9–10.3)
Chloride: 100 mmol/L (ref 98–111)
Creatinine, Ser: 0.86 mg/dL (ref 0.44–1.00)
GFR calc Af Amer: >60 mL/min (ref >60)
GFR calc non Af Amer: >60 mL/min (ref >60)
Glucose, Bld: 94 mg/dL (ref 70–99)
Potassium: 3.7 mmol/L (ref 3.5–5.1)
Sodium: 141 mmol/L (ref 135–145)
Total Bilirubin: 0.5 mg/dL (ref 0.3–1.2)
Total Protein: 6.8 g/dL (ref 6.5–8.1)

## 2019-10-09 MED ORDER — SODIUM CHLORIDE 0.9 % IV SOLN
Freq: Once | INTRAVENOUS | Status: AC
  Filled 2019-10-09: qty 250

## 2019-10-09 MED ORDER — SODIUM CHLORIDE 0.9 % IV SOLN
1200.0000 mg | Freq: Once | INTRAVENOUS | Status: AC
  Administered 2019-10-09: 1200 mg via INTRAVENOUS
  Filled 2019-10-09: qty 20

## 2019-10-09 MED ORDER — HEPARIN SOD (PORK) LOCK FLUSH 100 UNIT/ML IV SOLN
500.0000 [IU] | Freq: Once | INTRAVENOUS | Status: AC | PRN
  Administered 2019-10-09: 500 [IU]
  Filled 2019-10-09: qty 5

## 2019-10-09 MED ORDER — SODIUM CHLORIDE 0.9% FLUSH
10.0000 mL | Freq: Once | INTRAVENOUS | Status: AC
  Administered 2019-10-09: 10 mL
  Filled 2019-10-09: qty 10

## 2019-10-09 NOTE — Progress Notes (Signed)
No new changes noted today 

## 2019-10-09 NOTE — Addendum Note (Signed)
Addended by: Vito Berger on: 10/09/2019 09:47 AM   Modules accepted: Orders

## 2019-10-09 NOTE — Progress Notes (Signed)
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 She has been referred to [place] for findings of bone metastases and her recent MRI. She has a prior history of 3 packs/day day smoking for over 45 years and quit smoking 5 years ago.She had a CT chest abdomen pelvis in 2018 which showed a 5 mm lung nodule in the left lower lobe. Recently over the last 2 months patient has been having worsening back pain and was referred to orthopedics. 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 Pathologic fracture of S1.  PET scan showed 2 RUL lung nodules, hilar and mediastinal adenopathy and widespread bone mets. MRI brain negative.  Patientcompletedpalliative RT to her spine. Bronchoscopy showed small cell lung cancer. Palliative carboplatin, etoposide and Tecentriq started on 08/18/2018. Scans after 4 cycles showed stable disease. She is on maintenance tecentriq  Interval history-she reports doing well and denies any complaints at this time.  Appetite and weight are stable.  Denies any back pain  ECOG PS- 1 Pain scale- 0   Review of  systems- Review of Systems  Constitutional: Negative for chills, fever, malaise/fatigue and weight loss.  HENT: Negative for congestion, ear discharge and nosebleeds.   Eyes: Negative for blurred vision.  Respiratory: Negative for cough, hemoptysis, sputum production, shortness of breath and wheezing.   Cardiovascular: Negative for chest pain, palpitations, orthopnea and claudication.  Gastrointestinal: Negative for abdominal pain, blood in stool, constipation, diarrhea, heartburn, melena, nausea and vomiting.  Genitourinary: Negative for dysuria, flank pain, frequency, hematuria and urgency.  Musculoskeletal: Negative for back pain, joint pain and myalgias.  Skin: Negative for rash.  Neurological: Negative for dizziness, tingling, focal weakness, seizures, weakness and headaches.  Endo/Heme/Allergies: Does not bruise/bleed easily.  Psychiatric/Behavioral: Negative for depression and suicidal ideas. The patient does not have insomnia.       Allergies  Allergen Reactions  . Bee Venom Swelling     Past Medical History:  Diagnosis Date  . Arthritis   . Cirrhosis ([place])   . Depression   . GERD (gastroesophageal reflux disease)   . Hyperlipidemia   . Hypertension   . Lung cancer ([place])   . Metastatic bone cancer [facility])      Past Surgical History:  Procedure Laterality Date  . APPENDECTOMY  1971  . CHOLECYSTECTOMY  1971  . COLONOSCOPY WITH PROPOFOL N/A 11/15/2016   Procedure: COLONOSCOPY WITH PROPOFOL;  Surgeon: Jonathon Bellows, MD;  Location: [facility] ENDOSCOPY;  Service: Gastroenterology;  Laterality: N/A;  . ENDOBRONCHIAL ULTRASOUND Right 07/30/2018   Procedure: ENDOBRONCHIAL ULTRASOUND;  Surgeon: Laverle Hobby, MD;  Location: [facility] ORS;  Service: Pulmonary;  Laterality: Right;  . ESOPHAGOGASTRODUODENOSCOPY (EGD) WITH PROPOFOL N/A 01/07/2017   Procedure: ESOPHAGOGASTRODUODENOSCOPY (EGD) WITH PROPOFOL;  Surgeon: Jonathon Bellows, MD;  Location: [facility] ENDOSCOPY;  Service: Gastroenterology;   Laterality: N/A;  . LAPAROSCOPY N/A 03/01/2017   Procedure: LAPAROSCOPY DIAGNOSTIC;  Surgeon: Robert Bellow, MD;  Location: [facility] ORS;  Service: General;  Laterality: N/A;  . PORTA CATH INSERTION N/A 08/14/2018   Procedure: PORTA CATH INSERTION;  Surgeon: Algernon Huxley, MD;  Location: [place] CV LAB;  Service: Cardiovascular;  Laterality: N/A;  . TONSILECTOMY, ADENOIDECTOMY, BILATERAL MYRINGOTOMY [place]  . TONSILLECTOMY    . VENTRAL HERNIA REPAIR N/A 03/01/2017   10 x 14 CM Ventralight ST mesh, intraperitoneal location.   . VENTRAL HERNIA REPAIR N/A 03/01/2017   Procedure: HERNIA REPAIR VENTRAL ADULT;  Surgeon: Robert Bellow, MD;  Location: [facility] ORS;  Service: General;  Laterality: N/A;    Social History   Socioeconomic History  . Marital status: Married    Spouse name: [name]  . Number of children: Not on file  . Years of education: Not on file  . Highest education level: Not on file  Occupational History  . Not on file  Tobacco Use  . Smoking status: Former Smoker    Packs/day: 2.00    Years: 50.00    Pack years: 100.00    Types: Cigarettes, E-cigarettes    Quit date: 02/07/2012    Years since quitting: 7.6  . Smokeless tobacco: Never Used  Vaping Use  . Vaping Use: Some days  . Start date: 10/06/2013  . Devices: uses no liquid  Substance and Sexual Activity  . Alcohol use: No    Alcohol/week: 0.0 standard drinks  . Drug use: No  . Sexual activity: Yes  Other Topics Concern  . Not on file  Social History Narrative   Married   Retired   Clinical cytogeneticist level of education    No children    1 cup of coffee   Social Determinants of Radio broadcast assistant Strain: Low Risk   . Difficulty of Paying Living Expenses: Not hard at all  Food Insecurity: No Food Insecurity  . Worried About Charity fundraiser in the Last Year: Never true  . Ran Out of Food in the Last Year: Never true  Transportation Needs: No Transportation Needs  . Lack of  Transportation (Medical): No  . Lack of Transportation (Non-Medical): No  Physical Activity: Unknown  . Days of Exercise per Week: Patient refused  . Minutes of Exercise per Session: Patient refused  Stress: No Stress Concern Present  . Feeling of Stress : Not at all  Social Connections: Unknown  . Frequency of Communication with Friends and Family: Patient refused  . Frequency of Social Gatherings with Friends and Family: Patient refused  . Attends Religious Services: Patient refused  . Active Member of Clubs or Organizations: Patient refused  . Attends Archivist Meetings: Patient refused  . Marital Status: Patient refused  Intimate Partner Violence: Not At Risk  . Fear of Current or Ex-Partner: No  . Emotionally Abused: No  . Physically Abused: No  . Sexually Abused: No    Family History  Problem Relation Age of Onset  . Hypertension Mother   . Ovarian cancer Mother 54  . Heart disease Father   . Stroke Father   . Ovarian cancer Sister        ? dx cancer had hyst.  . Breast cancer Neg Hx      Current Outpatient Medications:  .  buPROPion (WELLBUTRIN XL) 300 MG 24  hr tablet, Take 1 tablet by mouth once daily, Disp: 90 tablet, Rfl: 0 .  escitalopram (LEXAPRO) 10 MG tablet, Take 1 tablet by mouth once daily, Disp: 90 tablet, Rfl: 0 .  folic acid (FOLVITE) 1 MG tablet, Take 1 tablet (1 mg total) by mouth daily., Disp: 90 tablet, Rfl: 1 .  ibuprofen (ADVIL,MOTRIN) 200 MG tablet, Take 400 mg by mouth every 8 (eight) hours as needed for headache or moderate pain. , Disp: , Rfl:  .  levothyroxine (SYNTHROID) 150 MCG tablet, Take 1 tablet (150 mcg total) by mouth daily before breakfast., Disp: 30 tablet, Rfl: 3 .  lidocaine-prilocaine (EMLA) cream, Apply to affected area once, Disp: 30 g, Rfl: 3 .  Multiple Vitamins-Minerals (PRESERVISION AREDS 2) CAPS, Take 1 capsule by mouth 2 (two) times a day. , Disp: , Rfl:  .  polyethylene glycol (MIRALAX / GLYCOLAX) 17 g packet,  Take 17 g by mouth daily as needed for mild constipation., Disp: 14 each, Rfl: 0 .  rosuvastatin (CRESTOR) 20 MG tablet, Take 1 tablet by mouth once daily, Disp: 90 tablet, Rfl: 0 .  loperamide (IMODIUM) 2 MG capsule, Take 1 capsule (2 mg total) by mouth every 2 (two) hours as needed for diarrhea or loose stools. Do not exceed 16mg  daily. (Patient not taking: Reported on 10/09/2019), Disp: 30 capsule, Rfl: 1 No current facility-administered medications for this visit.  Facility-Administered Medications Ordered in Other Visits:  .  denosumab (XGEVA) injection 120 mg, 120 mg, Subcutaneous, Q30 days, Sindy Guadeloupe, MD, 120 mg at 03/05/19 1525  Physical exam:  Vitals:   10/09/19 0911  BP: 129/73  Pulse: 72  Resp: 18  Temp: (!) 97.4 F (36.3 C)  TempSrc: Tympanic  SpO2: 96%  Weight: 188 lb 14.4 oz (85.7 kg)  Height: 5\' 5"  (1.651 m)   Physical Exam Constitutional:      General: She is not in acute distress. Cardiovascular:     Rate and Rhythm: Normal rate and regular rhythm.     Heart sounds: Normal heart sounds.  Pulmonary:     Effort: Pulmonary effort is normal.     Breath sounds: Normal breath sounds.  Abdominal:     General: Bowel sounds are normal.     Palpations: Abdomen is soft.  Skin:    General: Skin is warm and dry.  Neurological:     Mental Status: She is alert and oriented to person, place, and time.      CMP Latest Ref Rng & Units 10/09/2019  Glucose 70 - 99 mg/dL 94  BUN 8 - 23 mg/dL 15  Creatinine 0.44 - 1.00 mg/dL 0.86  Sodium 135 - 145 mmol/L 141  Potassium 3.5 - 5.1 mmol/L 3.7  Chloride 98 - 111 mmol/L 100  CO2 22 - 32 mmol/L 30  Calcium 8.9 - 10.3 mg/dL 9.0  Total Protein 6.5 - 8.1 g/dL 6.8  Total Bilirubin 0.3 - 1.2 mg/dL 0.5  Alkaline Phos 38 - 126 U/L 66  AST 15 - 41 U/L 22  ALT 0 - 44 U/L 14   CBC Latest Ref Rng & Units 10/09/2019  WBC 4.0 - 10.5 K/uL 4.4  Hemoglobin 12.0 - 15.0 g/dL 11.1(L)  Hematocrit 36 - 46 % 34.0(L)  Platelets 150 - 400 K/uL  151      Assessment and plan- Patient is a 69 y.o. female with extensive stage small cell lung cancer and bone metastases.  She is here for on treatment assessment prior to cycle 16  of Tecentriq  Counts okay to proceed with cycle 16 of maintenance Tecentriq today.  I will see her back in 3 weeks for cycle 17.  Recent scans showed stable disease.  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  Last TSH was normal.  Bone metastases: Xgeva on hold as patient developed significant hypocalcemia in the past Visit Diagnosis 1. Encounter for antineoplastic immunotherapy   2. Small cell lung cancer, right upper lobe ([place])   3. Autoimmune hypothyroidism      Dr. Randa Evens, MD, MPH [facility] [phone] 10/09/2019 9:42 AM

## 2019-10-30 ENCOUNTER — Other Ambulatory Visit: Payer: Self-pay

## 2019-10-30 ENCOUNTER — Inpatient Hospital Stay: Payer: PPO

## 2019-10-30 ENCOUNTER — Inpatient Hospital Stay (HOSPITAL_BASED_OUTPATIENT_CLINIC_OR_DEPARTMENT_OTHER): Payer: PPO | Admitting: Oncology

## 2019-10-30 VITALS — BP 152/89 | HR 75 | Temp 96.3°F | Resp 18 | Wt 187.7 lb

## 2019-10-30 VITALS — BP 152/89 | HR 81 | Resp 18

## 2019-10-30 DIAGNOSIS — C3411 Malignant neoplasm of upper lobe, right bronchus or lung: Secondary | ICD-10-CM

## 2019-10-30 DIAGNOSIS — Z5112 Encounter for antineoplastic immunotherapy: Secondary | ICD-10-CM | POA: Diagnosis not present

## 2019-10-30 DIAGNOSIS — E063 Autoimmune thyroiditis: Secondary | ICD-10-CM

## 2019-10-30 LAB — COMPREHENSIVE METABOLIC PANEL
ALT: 14 U/L (ref 0–44)
AST: 20 U/L (ref 15–41)
Albumin: 3.5 g/dL (ref 3.5–5.0)
Alkaline Phosphatase: 80 U/L (ref 38–126)
Anion gap: 9 (ref 5–15)
BUN: 17 mg/dL (ref 8–23)
CO2: 31 mmol/L (ref 22–32)
Calcium: 8.8 mg/dL — ABNORMAL LOW (ref 8.9–10.3)
Chloride: 104 mmol/L (ref 98–111)
Creatinine, Ser: 0.84 mg/dL (ref 0.44–1.00)
GFR calc Af Amer: >60 mL/min (ref >60)
GFR calc non Af Amer: >60 mL/min (ref >60)
Glucose, Bld: 96 mg/dL (ref 70–99)
Potassium: 3.6 mmol/L (ref 3.5–5.1)
Sodium: 144 mmol/L (ref 135–145)
Total Bilirubin: 0.5 mg/dL (ref 0.3–1.2)
Total Protein: 6.9 g/dL (ref 6.5–8.1)

## 2019-10-30 LAB — CBC WITH DIFFERENTIAL/PLATELET
Abs Immature Granulocytes: 0.02 10*3/uL (ref 0.00–0.07)
Basophils Absolute: 0.1 10*3/uL (ref 0.0–0.1)
Basophils Relative: 1 %
Eosinophils Absolute: 0.9 10*3/uL — ABNORMAL HIGH (ref 0.0–0.5)
Eosinophils Relative: 19 %
HCT: 34.5 % — ABNORMAL LOW (ref 36.0–46.0)
Hemoglobin: 11 g/dL — ABNORMAL LOW (ref 12.0–15.0)
Immature Granulocytes: 0 %
Lymphocytes Relative: 11 %
Lymphs Abs: 0.5 10*3/uL — ABNORMAL LOW (ref 0.7–4.0)
MCH: 28.1 pg (ref 26.0–34.0)
MCHC: 31.9 g/dL (ref 30.0–36.0)
MCV: 88.2 fL (ref 80.0–100.0)
Monocytes Absolute: 0.3 10*3/uL (ref 0.1–1.0)
Monocytes Relative: 7 %
Neutro Abs: 3.1 10*3/uL (ref 1.7–7.7)
Neutrophils Relative %: 62 %
Platelets: 134 10*3/uL — ABNORMAL LOW (ref 150–400)
RBC: 3.91 MIL/uL (ref 3.87–5.11)
RDW: 15.6 % — ABNORMAL HIGH (ref 11.5–15.5)
WBC: 5 10*3/uL (ref 4.0–10.5)
nRBC: 0 % (ref 0.0–0.2)

## 2019-10-30 MED ORDER — SODIUM CHLORIDE 0.9 % IV SOLN
Freq: Once | INTRAVENOUS | Status: AC
  Filled 2019-10-30: qty 250

## 2019-10-30 MED ORDER — SODIUM CHLORIDE 0.9 % IV SOLN
1200.0000 mg | Freq: Once | INTRAVENOUS | Status: AC
  Administered 2019-10-30: 1200 mg via INTRAVENOUS
  Filled 2019-10-30: qty 20

## 2019-10-30 MED ORDER — HEPARIN SOD (PORK) LOCK FLUSH 100 UNIT/ML IV SOLN
500.0000 [IU] | Freq: Once | INTRAVENOUS | Status: DC | PRN
  Filled 2019-10-30: qty 5

## 2019-10-30 MED ORDER — SODIUM CHLORIDE 0.9% FLUSH
10.0000 mL | INTRAVENOUS | Status: DC | PRN
  Administered 2019-10-30: 10 mL via INTRAVENOUS
  Filled 2019-10-30: qty 10

## 2019-10-30 MED ORDER — HEPARIN SOD (PORK) LOCK FLUSH 100 UNIT/ML IV SOLN
500.0000 [IU] | Freq: Once | INTRAVENOUS | Status: AC
  Administered 2019-10-30: 500 [IU] via INTRAVENOUS
  Filled 2019-10-30: qty 5

## 2019-11-02 ENCOUNTER — Encounter: Payer: Self-pay | Admitting: Oncology

## 2019-11-02 DIAGNOSIS — H353210 Exudative age-related macular degeneration, right eye, stage unspecified: Secondary | ICD-10-CM | POA: Diagnosis not present

## 2019-11-02 NOTE — Progress Notes (Signed)
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 She has been referred to [place] for findings of bone metastases and her recent MRI. She has a prior history of 3 packs/day day smoking for over 45 years and quit smoking 5 years ago.She had a CT chest abdomen pelvis in 2018 which showed a 5 mm lung nodule in the left lower lobe. Recently over the last 2 months patient has been having worsening back pain and was referred to orthopedics. 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 Pathologic fracture of S1.  PET scan showed 2 RUL lung nodules, hilar and mediastinal adenopathy and widespread bone mets. MRI brain negative.  Patientcompletedpalliative RT to her spine. Bronchoscopy showed small cell lung cancer. Palliative carboplatin, etoposide and Tecentriq started on 08/18/2018. Scans after 4 cycles showed stable disease. She is on maintenance tecentriq  Interval history-patient is doing well and denies any complaints at this time.  Denies any skin rash diarrhea.  Appetite and weight have remained stable  ECOG PS- 1 Pain scale-  0  Review of systems- Review of Systems  Constitutional: Negative for chills, fever, malaise/fatigue and weight loss.  HENT: Negative for congestion, ear discharge and nosebleeds.   Eyes: Negative for blurred vision.  Respiratory: Negative for cough, hemoptysis, sputum production, shortness of breath and wheezing.   Cardiovascular: Negative for chest pain, palpitations, orthopnea and claudication.  Gastrointestinal: Negative for abdominal pain, blood in stool, constipation, diarrhea, heartburn, melena, nausea and vomiting.  Genitourinary: Negative for dysuria, flank pain, frequency, hematuria and urgency.  Musculoskeletal: Negative for back pain, joint pain and myalgias.  Skin: Negative for rash.  Neurological: Negative for dizziness, tingling, focal weakness, seizures, weakness and headaches.  Endo/Heme/Allergies: Does not bruise/bleed easily.  Psychiatric/Behavioral: Negative for depression and suicidal ideas. The patient does not have insomnia.        Allergies  Allergen Reactions  . Bee Venom Swelling     Past Medical History:  Diagnosis Date  . Arthritis   . Cirrhosis ([place])   . Depression   . GERD (gastroesophageal reflux disease)   . Hyperlipidemia   . Hypertension   . Lung cancer ([place])   . Metastatic bone cancer [facility])      Past Surgical History:  Procedure Laterality Date  . APPENDECTOMY  1971  . CHOLECYSTECTOMY  1971  . COLONOSCOPY WITH PROPOFOL N/A 11/15/2016   Procedure: COLONOSCOPY WITH PROPOFOL;  Surgeon: Jonathon Bellows, MD;  Location: [facility] ENDOSCOPY;  Service: Gastroenterology;  Laterality: N/A;  . ENDOBRONCHIAL ULTRASOUND Right 07/30/2018   Procedure: ENDOBRONCHIAL ULTRASOUND;  Surgeon: Laverle Hobby, MD;  Location: [facility] ORS;  Service: Pulmonary;  Laterality: Right;  . ESOPHAGOGASTRODUODENOSCOPY (EGD) WITH PROPOFOL N/A 01/07/2017   Procedure: ESOPHAGOGASTRODUODENOSCOPY (EGD) WITH  PROPOFOL;  Surgeon: Jonathon Bellows, MD;  Location: [facility] ENDOSCOPY;  Service:  Gastroenterology;  Laterality: N/A;  . LAPAROSCOPY N/A 03/01/2017   Procedure: LAPAROSCOPY DIAGNOSTIC;  Surgeon: Robert Bellow, MD;  Location: [facility] ORS;  Service: General;  Laterality: N/A;  . PORTA CATH INSERTION N/A 08/14/2018   Procedure: PORTA CATH INSERTION;  Surgeon: Algernon Huxley, MD;  Location: [place] CV LAB;  Service: Cardiovascular;  Laterality: N/A;  . TONSILECTOMY, ADENOIDECTOMY, BILATERAL MYRINGOTOMY [place]  . TONSILLECTOMY    . VENTRAL HERNIA REPAIR N/A 03/01/2017   10 x 14 CM Ventralight ST mesh, intraperitoneal location.   . VENTRAL HERNIA REPAIR N/A 03/01/2017   Procedure: HERNIA REPAIR VENTRAL ADULT;  Surgeon: Robert Bellow, MD;  Location: [facility] ORS;  Service: General;  Laterality: N/A;    Social History   Socioeconomic History  . Marital status: Married    Spouse name: [name]  . Number of children: Not on file  . Years of education: Not on file  . Highest education level: Not on file  Occupational History  . Not on file  Tobacco Use  . Smoking status: Former Smoker    Packs/day: 2.00    Years: 50.00    Pack years: 100.00    Types: Cigarettes, E-cigarettes    Quit date: 02/07/2012    Years since quitting: 7.7  . Smokeless tobacco: Never Used  Vaping Use  . Vaping Use: Some days  . Start date: 10/06/2013  . Devices: uses no liquid  Substance and Sexual Activity  . Alcohol use: No    Alcohol/week: 0.0 standard drinks  . Drug use: No  . Sexual activity: Yes  Other Topics Concern  . Not on file  Social History Narrative   Married   Retired   Clinical cytogeneticist level of education    No children    1 cup of coffee   Social Determinants of Radio broadcast assistant Strain: Low Risk   . Difficulty of Paying Living Expenses: Not hard at all  Food Insecurity: No Food Insecurity  . Worried About Charity fundraiser in the Last Year: Never true  . Ran Out of Food in the Last Year: Never true  Transportation Needs: No Transportation  Needs  . Lack of Transportation (Medical): No  . Lack of Transportation (Non-Medical): No  Physical Activity:   . Days of Exercise per Week: Not on file  . Minutes of Exercise per Session: Not on file  Stress: No Stress Concern Present  . Feeling of Stress : Not at all  Social Connections:   . Frequency of Communication with Friends and Family: Not on file  . Frequency of Social Gatherings with Friends and Family: Not on file  . Attends Religious Services: Not on file  . Active Member of Clubs or Organizations: Not on file  . Attends Archivist Meetings: Not on file  . Marital Status: Not on file  Intimate Partner Violence: Not At Risk  . Fear of Current or Ex-Partner: No  . Emotionally Abused: No  . Physically Abused: No  . Sexually Abused: No    Family History  Problem Relation Age of Onset  . Hypertension Mother   . Ovarian cancer Mother 13  . Heart disease Father   . Stroke Father   . Ovarian cancer Sister        ? dx cancer had hyst.  . Breast cancer Neg Hx      Current Outpatient  Medications:  .  buPROPion (WELLBUTRIN XL) 300 MG 24 hr tablet, Take 1 tablet by mouth once daily, Disp: 90 tablet, Rfl: 0 .  escitalopram (LEXAPRO) 10 MG tablet, Take 1 tablet by mouth once daily, Disp: 90 tablet, Rfl: 0 .  folic acid (FOLVITE) 1 MG tablet, Take 1 tablet (1 mg total) by mouth daily., Disp: 90 tablet, Rfl: 1 .  ibuprofen (ADVIL,MOTRIN) 200 MG tablet, Take 400 mg by mouth every 8 (eight) hours as needed for headache or moderate pain. , Disp: , Rfl:  .  levothyroxine (SYNTHROID) 150 MCG tablet, Take 1 tablet (150 mcg total) by mouth daily before breakfast., Disp: 30 tablet, Rfl: 3 .  lidocaine-prilocaine (EMLA) cream, Apply to affected area once, Disp: 30 g, Rfl: 3 .  Multiple Vitamins-Minerals (PRESERVISION AREDS 2) CAPS, Take 1 capsule by mouth 2 (two) times a day. , Disp: , Rfl:  .  polyethylene glycol (MIRALAX / GLYCOLAX) 17 g packet, Take 17 g by mouth daily as  needed for mild constipation., Disp: 14 each, Rfl: 0 .  rosuvastatin (CRESTOR) 20 MG tablet, Take 1 tablet by mouth once daily, Disp: 90 tablet, Rfl: 0 .  loperamide (IMODIUM) 2 MG capsule, Take 1 capsule (2 mg total) by mouth every 2 (two) hours as needed for diarrhea or loose stools. Do not exceed 16mg  daily. (Patient not taking: Reported on 10/09/2019), Disp: 30 capsule, Rfl: 1 No current facility-administered medications for this visit.  Facility-Administered Medications Ordered in Other Visits:  .  denosumab (XGEVA) injection 120 mg, 120 mg, Subcutaneous, Q30 days, Sindy Guadeloupe, MD, 120 mg at 03/05/19 1525  Physical exam:  Vitals:   10/30/19 0859  BP: (!) 152/89  Pulse: 75  Resp: 18  Temp: (!) 96.3 F (35.7 C)  TempSrc: Tympanic  SpO2: 98%  Weight: 187 lb 11.2 oz (85.1 kg)   Physical Exam Constitutional:      General: She is not in acute distress. Cardiovascular:     Rate and Rhythm: Normal rate and regular rhythm.     Heart sounds: Normal heart sounds.  Pulmonary:     Effort: Pulmonary effort is normal.     Breath sounds: Normal breath sounds.  Abdominal:     General: Bowel sounds are normal.     Palpations: Abdomen is soft.  Skin:    General: Skin is warm and dry.  Neurological:     Mental Status: She is alert and oriented to person, place, and time.      CMP Latest Ref Rng & Units 10/30/2019  Glucose 70 - 99 mg/dL 96  BUN 8 - 23 mg/dL 17  Creatinine 0.44 - 1.00 mg/dL 0.84  Sodium 135 - 145 mmol/L 144  Potassium 3.5 - 5.1 mmol/L 3.6  Chloride 98 - 111 mmol/L 104  CO2 22 - 32 mmol/L 31  Calcium 8.9 - 10.3 mg/dL 8.8(L)  Total Protein 6.5 - 8.1 g/dL 6.9  Total Bilirubin 0.3 - 1.2 mg/dL 0.5  Alkaline Phos 38 - 126 U/L 80  AST 15 - 41 U/L 20  ALT 0 - 44 U/L 14   CBC Latest Ref Rng & Units 10/30/2019  WBC 4.0 - 10.5 K/uL 5.0  Hemoglobin 12.0 - 15.0 g/dL 11.0(L)  Hematocrit 36 - 46 % 34.5(L)  Platelets 150 - 400 K/uL 134(L)     Assessment and plan- Patient  is a 69 y.o. female with extensive stage small cell lung cancer and bone metastases.  She is here for on treatment assessment  prior to next cycle of maintenance Tecentriq  Counts okay to proceed with cycle 17 of maintenance Tecentriq today.  I will see her back in 3 weeks for cycle 18.  Last TSH from 09/18/2019 was normal.  Autoimmune hypothyroidism: On 150 mcg of levothyroxine Visit Diagnosis 1. Small cell lung cancer, right upper lobe ([place])   2. Encounter for antineoplastic immunotherapy   3. Autoimmune hypothyroidism      Dr. Randa Evens, MD, MPH [facility] [phone] 11/02/2019 8:25 AM

## 2019-11-05 ENCOUNTER — Other Ambulatory Visit: Payer: Self-pay

## 2019-11-05 ENCOUNTER — Emergency Department: Payer: PPO

## 2019-11-05 ENCOUNTER — Emergency Department
Admission: EM | Admit: 2019-11-05 | Discharge: 2019-11-05 | Disposition: A | Discharge status: Home / Self Care | Payer: PPO | Attending: Emergency Medicine | Admitting: Emergency Medicine

## 2019-11-05 DIAGNOSIS — Z5321 Procedure and treatment not carried out due to patient leaving prior to being seen by health care provider: Secondary | ICD-10-CM | POA: Insufficient documentation

## 2019-11-05 DIAGNOSIS — R110 Nausea: Secondary | ICD-10-CM | POA: Insufficient documentation

## 2019-11-05 LAB — COMPREHENSIVE METABOLIC PANEL
ALT: 15 U/L (ref 0–44)
AST: 21 U/L (ref 15–41)
Albumin: 4.1 g/dL (ref 3.5–5.0)
Alkaline Phosphatase: 97 U/L (ref 38–126)
Anion gap: 12 (ref 5–15)
BUN: 23 mg/dL (ref 8–23)
CO2: 27 mmol/L (ref 22–32)
Calcium: 9.7 mg/dL (ref 8.9–10.3)
Chloride: 100 mmol/L (ref 98–111)
Creatinine, Ser: 0.93 mg/dL (ref 0.44–1.00)
GFR calc Af Amer: >60 mL/min (ref >60)
GFR calc non Af Amer: >60 mL/min (ref >60)
Glucose, Bld: 114 mg/dL — ABNORMAL HIGH (ref 70–99)
Potassium: 3.9 mmol/L (ref 3.5–5.1)
Sodium: 139 mmol/L (ref 135–145)
Total Bilirubin: 0.9 mg/dL (ref 0.3–1.2)
Total Protein: 7.9 g/dL (ref 6.5–8.1)

## 2019-11-05 LAB — CBC
HCT: 38.5 % (ref 36.0–46.0)
Hemoglobin: 11.9 g/dL — ABNORMAL LOW (ref 12.0–15.0)
MCH: 27.7 pg (ref 26.0–34.0)
MCHC: 30.9 g/dL (ref 30.0–36.0)
MCV: 89.5 fL (ref 80.0–100.0)
Platelets: 128 K/uL — ABNORMAL LOW (ref 150–400)
RBC: 4.3 MIL/uL (ref 3.87–5.11)
RDW: 15.9 % — ABNORMAL HIGH (ref 11.5–15.5)
WBC: 6.4 K/uL (ref 4.0–10.5)
nRBC: 0 % (ref 0.0–0.2)

## 2019-11-05 LAB — TROPONIN I (HIGH SENSITIVITY): Troponin I (High Sensitivity): 3 ng/L (ref <18)

## 2019-11-05 NOTE — ED Notes (Signed)
Pt placed on o2 at 2l per [place] while waiting.

## 2019-11-05 NOTE — ED Triage Notes (Signed)
Pt in with co nausea since this am, no vomiting. No diarrhea, no abd pain, no dysuria or fever. Pt denies any cough or shob recently. O2 sats noted to be 89% in RA in triage, pt denies any hx of lung disease or hx of the same.

## 2019-11-12 ENCOUNTER — Encounter: Payer: Self-pay | Admitting: Family Medicine

## 2019-11-19 ENCOUNTER — Other Ambulatory Visit: Payer: Self-pay | Admitting: *Deleted

## 2019-11-19 DIAGNOSIS — C3411 Malignant neoplasm of upper lobe, right bronchus or lung: Secondary | ICD-10-CM

## 2019-11-19 DIAGNOSIS — R7989 Other specified abnormal findings of blood chemistry: Secondary | ICD-10-CM

## 2019-11-20 ENCOUNTER — Inpatient Hospital Stay (HOSPITAL_BASED_OUTPATIENT_CLINIC_OR_DEPARTMENT_OTHER): Payer: PPO | Admitting: Oncology

## 2019-11-20 ENCOUNTER — Inpatient Hospital Stay: Payer: PPO | Attending: Oncology

## 2019-11-20 ENCOUNTER — Inpatient Hospital Stay: Payer: PPO

## 2019-11-20 ENCOUNTER — Other Ambulatory Visit: Payer: Self-pay

## 2019-11-20 ENCOUNTER — Encounter: Payer: Self-pay | Admitting: Oncology

## 2019-11-20 VITALS — BP 128/85 | HR 76 | Temp 98.4°F | Resp 16 | Ht 65.0 in | Wt 183.7 lb

## 2019-11-20 DIAGNOSIS — E063 Autoimmune thyroiditis: Secondary | ICD-10-CM | POA: Insufficient documentation

## 2019-11-20 DIAGNOSIS — Z923 Personal history of irradiation: Secondary | ICD-10-CM | POA: Diagnosis not present

## 2019-11-20 DIAGNOSIS — K7460 Unspecified cirrhosis of liver: Secondary | ICD-10-CM | POA: Insufficient documentation

## 2019-11-20 DIAGNOSIS — Z8041 Family history of malignant neoplasm of ovary: Secondary | ICD-10-CM | POA: Insufficient documentation

## 2019-11-20 DIAGNOSIS — Z9049 Acquired absence of other specified parts of digestive tract: Secondary | ICD-10-CM | POA: Diagnosis not present

## 2019-11-20 DIAGNOSIS — Z5112 Encounter for antineoplastic immunotherapy: Secondary | ICD-10-CM | POA: Insufficient documentation

## 2019-11-20 DIAGNOSIS — C3411 Malignant neoplasm of upper lobe, right bronchus or lung: Secondary | ICD-10-CM | POA: Diagnosis not present

## 2019-11-20 DIAGNOSIS — E669 Obesity, unspecified: Secondary | ICD-10-CM | POA: Insufficient documentation

## 2019-11-20 DIAGNOSIS — E785 Hyperlipidemia, unspecified: Secondary | ICD-10-CM | POA: Diagnosis not present

## 2019-11-20 DIAGNOSIS — C7951 Secondary malignant neoplasm of bone: Secondary | ICD-10-CM | POA: Insufficient documentation

## 2019-11-20 DIAGNOSIS — R7989 Other specified abnormal findings of blood chemistry: Secondary | ICD-10-CM

## 2019-11-20 DIAGNOSIS — M25511 Pain in right shoulder: Secondary | ICD-10-CM | POA: Insufficient documentation

## 2019-11-20 DIAGNOSIS — Z79899 Other long term (current) drug therapy: Secondary | ICD-10-CM | POA: Diagnosis not present

## 2019-11-20 DIAGNOSIS — Z87891 Personal history of nicotine dependence: Secondary | ICD-10-CM | POA: Insufficient documentation

## 2019-11-20 DIAGNOSIS — I10 Essential (primary) hypertension: Secondary | ICD-10-CM | POA: Diagnosis not present

## 2019-11-20 LAB — CBC WITH DIFFERENTIAL/PLATELET
Abs Immature Granulocytes: 0.02 10*3/uL (ref 0.00–0.07)
Basophils Absolute: 0 10*3/uL (ref 0.0–0.1)
Basophils Relative: 1 %
Eosinophils Absolute: 0.1 10*3/uL (ref 0.0–0.5)
Eosinophils Relative: 3 %
HCT: 37.5 % (ref 36.0–46.0)
Hemoglobin: 11.9 g/dL — ABNORMAL LOW (ref 12.0–15.0)
Immature Granulocytes: 1 %
Lymphocytes Relative: 18 %
Lymphs Abs: 0.8 10*3/uL (ref 0.7–4.0)
MCH: 27.5 pg (ref 26.0–34.0)
MCHC: 31.7 g/dL (ref 30.0–36.0)
MCV: 86.8 fL (ref 80.0–100.0)
Monocytes Absolute: 0.3 10*3/uL (ref 0.1–1.0)
Monocytes Relative: 7 %
Neutro Abs: 3.2 10*3/uL (ref 1.7–7.7)
Neutrophils Relative %: 70 %
Platelets: 159 10*3/uL (ref 150–400)
RBC: 4.32 MIL/uL (ref 3.87–5.11)
RDW: 15.5 % (ref 11.5–15.5)
WBC: 4.4 10*3/uL (ref 4.0–10.5)
nRBC: 0 % (ref 0.0–0.2)

## 2019-11-20 LAB — COMPREHENSIVE METABOLIC PANEL
ALT: 17 U/L (ref 0–44)
AST: 22 U/L (ref 15–41)
Albumin: 3.9 g/dL (ref 3.5–5.0)
Alkaline Phosphatase: 94 U/L (ref 38–126)
Anion gap: 11 (ref 5–15)
BUN: 19 mg/dL (ref 8–23)
CO2: 29 mmol/L (ref 22–32)
Calcium: 9.1 mg/dL (ref 8.9–10.3)
Chloride: 103 mmol/L (ref 98–111)
Creatinine, Ser: 0.99 mg/dL (ref 0.44–1.00)
GFR, Estimated: 57 mL/min — ABNORMAL LOW (ref >60)
Glucose, Bld: 98 mg/dL (ref 70–99)
Potassium: 3.9 mmol/L (ref 3.5–5.1)
Sodium: 143 mmol/L (ref 135–145)
Total Bilirubin: 0.6 mg/dL (ref 0.3–1.2)
Total Protein: 7.5 g/dL (ref 6.5–8.1)

## 2019-11-20 LAB — TSH: TSH: 6.115 u[IU]/mL — ABNORMAL HIGH (ref 0.350–4.500)

## 2019-11-20 MED ORDER — SODIUM CHLORIDE 0.9% FLUSH
10.0000 mL | INTRAVENOUS | Status: DC | PRN
  Administered 2019-11-20: 10 mL via INTRAVENOUS
  Filled 2019-11-20: qty 10

## 2019-11-20 MED ORDER — HEPARIN SOD (PORK) LOCK FLUSH 100 UNIT/ML IV SOLN
500.0000 [IU] | Freq: Once | INTRAVENOUS | Status: AC
  Administered 2019-11-20: 500 [IU] via INTRAVENOUS
  Filled 2019-11-20: qty 5

## 2019-11-20 MED ORDER — SODIUM CHLORIDE 0.9 % IV SOLN
1200.0000 mg | Freq: Once | INTRAVENOUS | Status: AC
  Administered 2019-11-20: 1200 mg via INTRAVENOUS
  Filled 2019-11-20: qty 20

## 2019-11-20 MED ORDER — HEPARIN SOD (PORK) LOCK FLUSH 100 UNIT/ML IV SOLN
500.0000 [IU] | Freq: Once | INTRAVENOUS | Status: DC | PRN
  Filled 2019-11-20: qty 5

## 2019-11-20 MED ORDER — SODIUM CHLORIDE 0.9 % IV SOLN
Freq: Once | INTRAVENOUS | Status: AC
  Filled 2019-11-20: qty 250

## 2019-11-20 NOTE — Progress Notes (Signed)
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 She has been referred to [place] for findings of bone metastases and her recent MRI. She has a prior history of 3 packs/day day smoking for over 45 years and quit smoking 5 years ago.She had a CT chest abdomen pelvis in 2018 which showed a 5 mm lung nodule in the left lower lobe. Recently over the last 2 months patient has been having worsening back pain and was referred to orthopedics. 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 Pathologic fracture of S1.  PET scan showed 2 RUL lung nodules, hilar and mediastinal adenopathy and widespread bone mets. MRI brain negative.  Patientcompletedpalliative RT to her spine. Bronchoscopy showed small cell lung cancer. Palliative carboplatin, etoposide and Tecentriq started on 08/18/2018. Scans after 4 cycles showed stable disease. She is on maintenance tecentriq  Interval history-reports doing well and denies any complaints at this time other than mild right shoulder pain.  No limitation in her joint movements  ECOG PS- 1 Pain scale-  0   Review of systems- Review of Systems  Constitutional: Negative for chills, fever, malaise/fatigue and weight loss.  HENT: Negative for congestion, ear discharge and nosebleeds.   Eyes: Negative for blurred vision.  Respiratory: Negative for cough, hemoptysis, sputum production, shortness of breath and wheezing.   Cardiovascular: Negative for chest pain, palpitations, orthopnea and claudication.  Gastrointestinal: Negative for abdominal pain, blood in stool, constipation, diarrhea, heartburn, melena, nausea and vomiting.  Genitourinary: Negative for dysuria, flank pain, frequency, hematuria and urgency.  Musculoskeletal: Negative for back pain, joint pain and myalgias.       Right shoulder pain  Skin: Negative for rash.  Neurological: Negative for dizziness, tingling, focal weakness, seizures, weakness and headaches.  Endo/Heme/Allergies: Does not bruise/bleed easily.  Psychiatric/Behavioral: Negative for depression and suicidal ideas. The patient does not have insomnia.        Allergies  Allergen Reactions  . Bee Venom Swelling     Past Medical History:  Diagnosis Date  . Arthritis   . Cirrhosis ([place])   . Depression   . GERD (gastroesophageal reflux disease)   . Hyperlipidemia   . Hypertension   . Lung cancer ([place])   . Metastatic bone cancer [facility])      Past Surgical History:  Procedure Laterality Date  . APPENDECTOMY  1971  . CHOLECYSTECTOMY  1971  . COLONOSCOPY WITH PROPOFOL N/A 11/15/2016   Procedure: COLONOSCOPY WITH PROPOFOL;  Surgeon: Jonathon Bellows, MD;  Location: [facility] ENDOSCOPY;  Service: Gastroenterology;  Laterality: N/A;  . ENDOBRONCHIAL ULTRASOUND Right 07/30/2018   Procedure: ENDOBRONCHIAL ULTRASOUND;  Surgeon: Laverle Hobby, MD;  Location: [facility] ORS;  Service: Pulmonary;  Laterality: Right;  . ESOPHAGOGASTRODUODENOSCOPY (EGD) WITH  PROPOFOL N/A 01/07/2017   Procedure: ESOPHAGOGASTRODUODENOSCOPY (EGD) WITH PROPOFOL;  Surgeon: Jonathon Bellows, MD;   Location: [facility] ENDOSCOPY;  Service: Gastroenterology;  Laterality: N/A;  . LAPAROSCOPY N/A 03/01/2017   Procedure: LAPAROSCOPY DIAGNOSTIC;  Surgeon: Robert Bellow, MD;  Location: [facility] ORS;  Service: General;  Laterality: N/A;  . PORTA CATH INSERTION N/A 08/14/2018   Procedure: PORTA CATH INSERTION;  Surgeon: Algernon Huxley, MD;  Location: [place] CV LAB;  Service: Cardiovascular;  Laterality: N/A;  . TONSILECTOMY, ADENOIDECTOMY, BILATERAL MYRINGOTOMY [place]  . TONSILLECTOMY    . VENTRAL HERNIA REPAIR N/A 03/01/2017   10 x 14 CM Ventralight ST mesh, intraperitoneal location.   . VENTRAL HERNIA REPAIR N/A 03/01/2017   Procedure: HERNIA REPAIR VENTRAL ADULT;  Surgeon: Robert Bellow, MD;  Location: [facility] ORS;  Service: General;  Laterality: N/A;    Social History   Socioeconomic History  . Marital status: Married    Spouse name: [name]  . Number of children: Not on file  . Years of education: Not on file  . Highest education level: Not on file  Occupational History  . Not on file  Tobacco Use  . Smoking status: Former Smoker    Packs/day: 2.00    Years: 50.00    Pack years: 100.00    Types: Cigarettes, E-cigarettes    Quit date: 02/07/2012    Years since quitting: 7.7  . Smokeless tobacco: Never Used  Vaping Use  . Vaping Use: Some days  . Start date: 10/06/2013  . Devices: uses no liquid  Substance and Sexual Activity  . Alcohol use: No    Alcohol/week: 0.0 standard drinks  . Drug use: No  . Sexual activity: Yes  Other Topics Concern  . Not on file  Social History Narrative   Married   Retired   Clinical cytogeneticist level of education    No children    1 cup of coffee   Social Determinants of Radio broadcast assistant Strain: Low Risk   . Difficulty of Paying Living Expenses: Not hard at all  Food Insecurity: No Food Insecurity  . Worried About Charity fundraiser in the Last Year: Never true  . Ran Out of Food in the Last Year: Never true   Transportation Needs: No Transportation Needs  . Lack of Transportation (Medical): No  . Lack of Transportation (Non-Medical): No  Physical Activity:   . Days of Exercise per Week: Not on file  . Minutes of Exercise per Session: Not on file  Stress: No Stress Concern Present  . Feeling of Stress : Not at all  Social Connections:   . Frequency of Communication with Friends and Family: Not on file  . Frequency of Social Gatherings with Friends and Family: Not on file  . Attends Religious Services: Not on file  . Active Member of Clubs or Organizations: Not on file  . Attends Archivist Meetings: Not on file  . Marital Status: Not on file  Intimate Partner Violence: Not At Risk  . Fear of Current or Ex-Partner: No  . Emotionally Abused: No  . Physically Abused: No  . Sexually Abused: No    Family History  Problem Relation Age of Onset  . Hypertension Mother   . Ovarian cancer Mother 63  . Heart disease Father   . Stroke Father   . Ovarian cancer Sister        ? dx cancer had hyst.  . Breast cancer  Neg Hx      Current Outpatient Medications:  .  buPROPion (WELLBUTRIN XL) 300 MG 24 hr tablet, Take 1 tablet by mouth once daily, Disp: 90 tablet, Rfl: 0 .  escitalopram (LEXAPRO) 10 MG tablet, Take 1 tablet by mouth once daily, Disp: 90 tablet, Rfl: 0 .  folic acid (FOLVITE) 1 MG tablet, Take 1 tablet (1 mg total) by mouth daily., Disp: 90 tablet, Rfl: 1 .  ibuprofen (ADVIL,MOTRIN) 200 MG tablet, Take 400 mg by mouth every 8 (eight) hours as needed for headache or moderate pain. , Disp: , Rfl:  .  levothyroxine (SYNTHROID) 150 MCG tablet, Take 1 tablet (150 mcg total) by mouth daily before breakfast., Disp: 30 tablet, Rfl: 3 .  lidocaine-prilocaine (EMLA) cream, Apply to affected area once, Disp: 30 g, Rfl: 3 .  loperamide (IMODIUM) 2 MG capsule, Take 1 capsule (2 mg total) by mouth every 2 (two) hours as needed for diarrhea or loose stools. Do not exceed 16mg  daily.  (Patient not taking: Reported on 10/09/2019), Disp: 30 capsule, Rfl: 1 .  Multiple Vitamins-Minerals (PRESERVISION AREDS 2) CAPS, Take 1 capsule by mouth 2 (two) times a day. , Disp: , Rfl:  .  polyethylene glycol (MIRALAX / GLYCOLAX) 17 g packet, Take 17 g by mouth daily as needed for mild constipation., Disp: 14 each, Rfl: 0 .  rosuvastatin (CRESTOR) 20 MG tablet, Take 1 tablet by mouth once daily, Disp: 90 tablet, Rfl: 0 No current facility-administered medications for this visit.  Facility-Administered Medications Ordered in Other Visits:  .  denosumab (XGEVA) injection 120 mg, 120 mg, Subcutaneous, Q30 days, Sindy Guadeloupe, MD, 120 mg at 03/05/19 1525 .  heparin lock flush 100 unit/mL, 500 Units, Intravenous, Once, Sindy Guadeloupe, MD .  sodium chloride flush (NS) 0.9 % injection 10 mL, 10 mL, Intravenous, PRN, Sindy Guadeloupe, MD, 10 mL at 11/20/19 0825  Physical exam:  Vitals:   11/20/19 0852  BP: 128/85  Pulse: 76  Resp: 16  Temp: 98.4 F (36.9 C)  TempSrc: Oral  Height: 5\' 5"  (1.651 m)   Physical Exam Cardiovascular:     Rate and Rhythm: Normal rate and regular rhythm.     Heart sounds: Normal heart sounds.  Pulmonary:     Effort: Pulmonary effort is normal.     Breath sounds: Normal breath sounds.  Abdominal:     General: Bowel sounds are normal.     Palpations: Abdomen is soft.  Musculoskeletal:     Comments: Range of motion normal at the right shoulder joint  Skin:    General: Skin is warm and dry.  Neurological:     Mental Status: She is alert and oriented to person, place, and time.      CMP Latest Ref Rng & Units 11/05/2019  Glucose 70 - 99 mg/dL 114(H)  BUN 8 - 23 mg/dL 23  Creatinine 0.44 - 1.00 mg/dL 0.93  Sodium 135 - 145 mmol/L 139  Potassium 3.5 - 5.1 mmol/L 3.9  Chloride 98 - 111 mmol/L 100  CO2 22 - 32 mmol/L 27  Calcium 8.9 - 10.3 mg/dL 9.7  Total Protein 6.5 - 8.1 g/dL 7.9  Total Bilirubin 0.3 - 1.2 mg/dL 0.9  Alkaline Phos 38 - 126 U/L 97  AST  15 - 41 U/L 21  ALT 0 - 44 U/L 15   CBC Latest Ref Rng & Units 11/20/2019  WBC 4.0 - 10.5 K/uL 4.4  Hemoglobin 12.0 - 15.0 g/dL 11.9(L)  Hematocrit 36 - 46 % 37.5  Platelets 150 - 400 K/uL 159     Assessment and plan- Patient is a 69 y.o. female   with extensive stage small cell lung cancer and bone metastases.   She is here for on treatment assessment prior to next cycle of maintenance Tecentriq  Counts therapy to proceed with cycle 18 of maintenance Tecentriq today.  I will see her back in 3 weeks for cycle 19.  We will get CBC with differential CMP.  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  Right shoulder pain: Likely musculoskeletal.  She will be getting CT chest abdomen pelvis as well as bone scan 4 to 5 weeks from now  Visit Diagnosis 1. Encounter for antineoplastic immunotherapy   2. Small cell lung cancer, right upper lobe ([place])   3. Autoimmune hypothyroidism      Dr. Randa Evens, MD, MPH [facility] [phone] 11/20/2019 8:51 AM

## 2019-11-20 NOTE — Progress Notes (Signed)
Pt eating and drinking her regular amounts. She drinks tea a lot, and some water. Eating good she states. Good BM. No concerns

## 2019-12-01 ENCOUNTER — Telehealth: Payer: Self-pay | Admitting: Oncology

## 2019-12-01 NOTE — Telephone Encounter (Signed)
.  12/01/19 Called informing pt that their appts on 12/11/19 have been moved to 12/07/19 @ 9:30 instead of 9:15 per Dr. Janese Banks. Left call back number and informed them to reach out if they had any questions or concerns  SRW

## 2019-12-07 DIAGNOSIS — H353210 Exudative age-related macular degeneration, right eye, stage unspecified: Secondary | ICD-10-CM | POA: Diagnosis not present

## 2019-12-10 ENCOUNTER — Other Ambulatory Visit: Payer: Self-pay

## 2019-12-10 ENCOUNTER — Inpatient Hospital Stay: Payer: PPO

## 2019-12-10 ENCOUNTER — Encounter: Payer: Self-pay | Admitting: Oncology

## 2019-12-10 ENCOUNTER — Inpatient Hospital Stay: Payer: PPO | Attending: Oncology

## 2019-12-10 ENCOUNTER — Inpatient Hospital Stay (HOSPITAL_BASED_OUTPATIENT_CLINIC_OR_DEPARTMENT_OTHER): Payer: PPO | Admitting: Oncology

## 2019-12-10 VITALS — BP 131/80 | HR 74 | Temp 97.8°F | Resp 16 | Ht 65.0 in | Wt 182.1 lb

## 2019-12-10 DIAGNOSIS — Z79899 Other long term (current) drug therapy: Secondary | ICD-10-CM | POA: Diagnosis not present

## 2019-12-10 DIAGNOSIS — Z5111 Encounter for antineoplastic chemotherapy: Secondary | ICD-10-CM | POA: Diagnosis not present

## 2019-12-10 DIAGNOSIS — E669 Obesity, unspecified: Secondary | ICD-10-CM | POA: Insufficient documentation

## 2019-12-10 DIAGNOSIS — Z5112 Encounter for antineoplastic immunotherapy: Secondary | ICD-10-CM | POA: Insufficient documentation

## 2019-12-10 DIAGNOSIS — Z923 Personal history of irradiation: Secondary | ICD-10-CM | POA: Insufficient documentation

## 2019-12-10 DIAGNOSIS — Z9049 Acquired absence of other specified parts of digestive tract: Secondary | ICD-10-CM | POA: Insufficient documentation

## 2019-12-10 DIAGNOSIS — I10 Essential (primary) hypertension: Secondary | ICD-10-CM | POA: Diagnosis not present

## 2019-12-10 DIAGNOSIS — Z87891 Personal history of nicotine dependence: Secondary | ICD-10-CM | POA: Diagnosis not present

## 2019-12-10 DIAGNOSIS — M25511 Pain in right shoulder: Secondary | ICD-10-CM | POA: Diagnosis not present

## 2019-12-10 DIAGNOSIS — C3411 Malignant neoplasm of upper lobe, right bronchus or lung: Secondary | ICD-10-CM | POA: Diagnosis not present

## 2019-12-10 DIAGNOSIS — E063 Autoimmune thyroiditis: Secondary | ICD-10-CM | POA: Insufficient documentation

## 2019-12-10 DIAGNOSIS — K7460 Unspecified cirrhosis of liver: Secondary | ICD-10-CM | POA: Diagnosis not present

## 2019-12-10 DIAGNOSIS — E785 Hyperlipidemia, unspecified: Secondary | ICD-10-CM | POA: Diagnosis not present

## 2019-12-10 DIAGNOSIS — C7951 Secondary malignant neoplasm of bone: Secondary | ICD-10-CM | POA: Insufficient documentation

## 2019-12-10 DIAGNOSIS — Z8041 Family history of malignant neoplasm of ovary: Secondary | ICD-10-CM | POA: Diagnosis not present

## 2019-12-10 LAB — COMPREHENSIVE METABOLIC PANEL
ALT: 16 U/L (ref 0–44)
AST: 24 U/L (ref 15–41)
Albumin: 3.8 g/dL (ref 3.5–5.0)
Alkaline Phosphatase: 88 U/L (ref 38–126)
Anion gap: 10 (ref 5–15)
BUN: 25 mg/dL — ABNORMAL HIGH (ref 8–23)
CO2: 29 mmol/L (ref 22–32)
Calcium: 9.3 mg/dL (ref 8.9–10.3)
Chloride: 102 mmol/L (ref 98–111)
Creatinine, Ser: 0.9 mg/dL (ref 0.44–1.00)
GFR, Estimated: >60 mL/min (ref >60)
Glucose, Bld: 101 mg/dL — ABNORMAL HIGH (ref 70–99)
Potassium: 3.9 mmol/L (ref 3.5–5.1)
Sodium: 141 mmol/L (ref 135–145)
Total Bilirubin: 0.6 mg/dL (ref 0.3–1.2)
Total Protein: 7.6 g/dL (ref 6.5–8.1)

## 2019-12-10 LAB — CBC WITH DIFFERENTIAL/PLATELET
Abs Immature Granulocytes: 0.02 10*3/uL (ref 0.00–0.07)
Basophils Absolute: 0 10*3/uL (ref 0.0–0.1)
Basophils Relative: 1 %
Eosinophils Absolute: 0 10*3/uL (ref 0.0–0.5)
Eosinophils Relative: 0 %
HCT: 35.8 % — ABNORMAL LOW (ref 36.0–46.0)
Hemoglobin: 11.3 g/dL — ABNORMAL LOW (ref 12.0–15.0)
Immature Granulocytes: 1 %
Lymphocytes Relative: 18 %
Lymphs Abs: 0.7 10*3/uL (ref 0.7–4.0)
MCH: 27.6 pg (ref 26.0–34.0)
MCHC: 31.6 g/dL (ref 30.0–36.0)
MCV: 87.3 fL (ref 80.0–100.0)
Monocytes Absolute: 0.3 10*3/uL (ref 0.1–1.0)
Monocytes Relative: 8 %
Neutro Abs: 2.9 10*3/uL (ref 1.7–7.7)
Neutrophils Relative %: 72 %
Platelets: 159 10*3/uL (ref 150–400)
RBC: 4.1 MIL/uL (ref 3.87–5.11)
RDW: 15.9 % — ABNORMAL HIGH (ref 11.5–15.5)
WBC: 4 10*3/uL (ref 4.0–10.5)
nRBC: 0 % (ref 0.0–0.2)

## 2019-12-10 MED ORDER — SODIUM CHLORIDE 0.9% FLUSH
10.0000 mL | Freq: Once | INTRAVENOUS | Status: AC
  Administered 2019-12-10: 10 mL via INTRAVENOUS
  Filled 2019-12-10: qty 10

## 2019-12-10 MED ORDER — HEPARIN SOD (PORK) LOCK FLUSH 100 UNIT/ML IV SOLN
500.0000 [IU] | Freq: Once | INTRAVENOUS | Status: DC | PRN
  Filled 2019-12-10: qty 5

## 2019-12-10 MED ORDER — SODIUM CHLORIDE 0.9 % IV SOLN
1200.0000 mg | Freq: Once | INTRAVENOUS | Status: AC
  Administered 2019-12-10: 1200 mg via INTRAVENOUS
  Filled 2019-12-10: qty 20

## 2019-12-10 MED ORDER — SODIUM CHLORIDE 0.9 % IV SOLN
Freq: Once | INTRAVENOUS | Status: AC
  Filled 2019-12-10: qty 250

## 2019-12-10 MED ORDER — HEPARIN SOD (PORK) LOCK FLUSH 100 UNIT/ML IV SOLN
INTRAVENOUS | Status: AC
  Filled 2019-12-10: qty 5

## 2019-12-10 MED ORDER — HEPARIN SOD (PORK) LOCK FLUSH 100 UNIT/ML IV SOLN
500.0000 [IU] | Freq: Once | INTRAVENOUS | Status: AC
  Administered 2019-12-10: 500 [IU] via INTRAVENOUS
  Filled 2019-12-10: qty 5

## 2019-12-10 NOTE — Progress Notes (Signed)
Patient tolerated treatment today without any complications.

## 2019-12-10 NOTE — Progress Notes (Signed)
Pt doing ok she says, she eats and drinks like usual which is 1 meal a day and drinks fluids. This is what she has always done. Her weight has been slowing coming down. She says she needs to loose weight because her clothes are getting tight. Sleeps good, no issues at all

## 2019-12-11 ENCOUNTER — Other Ambulatory Visit: Payer: PPO

## 2019-12-11 ENCOUNTER — Ambulatory Visit: Payer: PPO

## 2019-12-11 ENCOUNTER — Ambulatory Visit: Payer: PPO | Admitting: Oncology

## 2019-12-13 NOTE — Progress Notes (Signed)
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 She has been referred to [place] for findings of bone metastases and her recent MRI. She has a prior history of 3 packs/day day smoking for over 45 years and quit smoking 5 years ago.She had a CT chest abdomen pelvis in 2018 which showed a 5 mm lung nodule in the left lower lobe. Recently over the last 2 months patient has been having worsening back pain and was referred to orthopedics. 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 Pathologic fracture of S1.  PET scan showed 2 RUL lung nodules, hilar and mediastinal adenopathy and widespread bone mets. MRI brain negative.  Patientcompletedpalliative RT to her spine. Bronchoscopy showed small cell lung cancer. Palliative carboplatin, etoposide and Tecentriq started on 08/18/2018. Scans after 4 cycles showed stable disease. She is on maintenance tecentriq   Interval history- Patient is doing well. Denies any complaints at this time. Denies any pain.   ECOG PS- 1 Pain scale- 0 Opioid associated constipation- no  Review of  systems- Review of Systems  Constitutional: Negative for chills, fever, malaise/fatigue and weight loss.  HENT: Negative for congestion, ear discharge and nosebleeds.   Eyes: Negative for blurred vision.  Respiratory: Negative for cough, hemoptysis, sputum production, shortness of breath and wheezing.   Cardiovascular: Negative for chest pain, palpitations, orthopnea and claudication.  Gastrointestinal: Negative for abdominal pain, blood in stool, constipation, diarrhea, heartburn, melena, nausea and vomiting.  Genitourinary: Negative for dysuria, flank pain, frequency, hematuria and urgency.  Musculoskeletal: Negative for back pain, joint pain and myalgias.  Skin: Negative for rash.  Neurological: Negative for dizziness, tingling, focal weakness, seizures, weakness and headaches.  Endo/Heme/Allergies: Does not bruise/bleed easily.  Psychiatric/Behavioral: Negative for depression and suicidal ideas. The patient does not have insomnia.        Allergies  Allergen Reactions  . Bee Venom Swelling     Past Medical History:  Diagnosis Date  . Arthritis   . Cirrhosis ([place])   . Depression   . GERD (gastroesophageal reflux disease)   . Hyperlipidemia   . Hypertension   . Lung cancer ([place])   . Metastatic bone cancer [facility])      Past Surgical History:  Procedure Laterality Date  . APPENDECTOMY  1971  . CHOLECYSTECTOMY  1971  . COLONOSCOPY WITH PROPOFOL N/A 11/15/2016   Procedure: COLONOSCOPY WITH PROPOFOL;  Surgeon: Jonathon Bellows, MD;  Location: [facility] ENDOSCOPY;  Service: Gastroenterology;  Laterality: N/A;  . ENDOBRONCHIAL ULTRASOUND Right 07/30/2018   Procedure: ENDOBRONCHIAL ULTRASOUND;  Surgeon: Laverle Hobby, MD;  Location: [facility] ORS;  Service: Pulmonary;  Laterality: Right;  . ESOPHAGOGASTRODUODENOSCOPY (EGD) WITH PROPOFOL N/A 01/07/2017   Procedure: ESOPHAGOGASTRODUODENOSCOPY (EGD) WITH PROPOFOL;  Surgeon:  Jonathon Bellows, MD;  Location: [facility] ENDOSCOPY;  Service:  Gastroenterology;  Laterality: N/A;  . LAPAROSCOPY N/A 03/01/2017   Procedure: LAPAROSCOPY DIAGNOSTIC;  Surgeon: Robert Bellow, MD;  Location: [facility] ORS;  Service: General;  Laterality: N/A;  . PORTA CATH INSERTION N/A 08/14/2018   Procedure: PORTA CATH INSERTION;  Surgeon: Algernon Huxley, MD;  Location: [place] CV LAB;  Service: Cardiovascular;  Laterality: N/A;  . TONSILECTOMY, ADENOIDECTOMY, BILATERAL MYRINGOTOMY [place]  . TONSILLECTOMY    . VENTRAL HERNIA REPAIR N/A 03/01/2017   10 x 14 CM Ventralight ST mesh, intraperitoneal location.   . VENTRAL HERNIA REPAIR N/A 03/01/2017   Procedure: HERNIA REPAIR VENTRAL ADULT;  Surgeon: Robert Bellow, MD;  Location: [facility] ORS;  Service: General;  Laterality: N/A;    Social History   Socioeconomic History  . Marital status: Married    Spouse name: [name]  . Number of children: Not on file  . Years of education: Not on file  . Highest education level: Not on file  Occupational History  . Not on file  Tobacco Use  . Smoking status: Former Smoker    Packs/day: 2.00    Years: 50.00    Pack years: 100.00    Types: Cigarettes, E-cigarettes    Quit date: 02/07/2012    Years since quitting: 7.8  . Smokeless tobacco: Never Used  Vaping Use  . Vaping Use: Some days  . Start date: 10/06/2013  . Devices: uses no liquid  Substance and Sexual Activity  . Alcohol use: No    Alcohol/week: 0.0 standard drinks  . Drug use: No  . Sexual activity: Yes  Other Topics Concern  . Not on file  Social History Narrative   Married   Retired   Clinical cytogeneticist level of education    No children    1 cup of coffee   Social Determinants of Radio broadcast assistant Strain: Low Risk   . Difficulty of Paying Living Expenses: Not hard at all  Food Insecurity: No Food Insecurity  . Worried About Charity fundraiser in the Last Year: Never true  . Ran Out of Food in the Last Year: Never true  Transportation Needs: No Transportation  Needs  . Lack of Transportation (Medical): No  . Lack of Transportation (Non-Medical): No  Physical Activity:   . Days of Exercise per Week: Not on file  . Minutes of Exercise per Session: Not on file  Stress: No Stress Concern Present  . Feeling of Stress : Not at all  Social Connections:   . Frequency of Communication with Friends and Family: Not on file  . Frequency of Social Gatherings with Friends and Family: Not on file  . Attends Religious Services: Not on file  . Active Member of Clubs or Organizations: Not on file  . Attends Archivist Meetings: Not on file  . Marital Status: Not on file  Intimate Partner Violence: Not At Risk  . Fear of Current or Ex-Partner: No  . Emotionally Abused: No  . Physically Abused: No  . Sexually Abused: No    Family History  Problem Relation Age of Onset  . Hypertension Mother   . Ovarian cancer Mother 68  . Heart disease Father   . Stroke Father   . Ovarian cancer Sister        ? dx cancer had hyst.  . Breast cancer Neg Hx      Current Outpatient Medications:  .  buPROPion (WELLBUTRIN XL) 300 MG 24 hr tablet, Take 1 tablet by mouth once daily, Disp: 90 tablet, Rfl: 0 .  escitalopram (LEXAPRO) 10 MG tablet, Take 1 tablet by mouth once daily, Disp: 90 tablet, Rfl: 0 .  folic acid (FOLVITE) 1 MG tablet, Take 1 tablet (1 mg total) by mouth daily., Disp: 90 tablet, Rfl: 1 .  ibuprofen (ADVIL,MOTRIN) 200 MG tablet, Take 400 mg by mouth every 8 (eight) hours as needed for headache or moderate pain. , Disp: , Rfl:  .  levothyroxine (SYNTHROID) 150 MCG tablet, Take 1 tablet (150 mcg total) by mouth daily before breakfast., Disp: 30 tablet, Rfl: 3 .  lidocaine-prilocaine (EMLA) cream, Apply to affected area once, Disp: 30 g, Rfl: 3 .  Multiple Vitamins-Minerals (PRESERVISION AREDS 2) CAPS, Take 1 capsule by mouth 2 (two) times a day. , Disp: , Rfl:  .  rosuvastatin (CRESTOR) 20 MG tablet, Take 1 tablet by mouth once daily, Disp: 90  tablet, Rfl: 0 .  loperamide (IMODIUM) 2 MG capsule, Take 1 capsule (2 mg total) by mouth every 2 (two) hours as needed for diarrhea or loose stools. Do not exceed 16mg  daily. (Patient not taking: Reported on 12/10/2019), Disp: 30 capsule, Rfl: 1 .  polyethylene glycol (MIRALAX / GLYCOLAX) 17 g packet, Take 17 g by mouth daily as needed for mild constipation. (Patient not taking: Reported on 12/10/2019), Disp: 14 each, Rfl: 0 No current facility-administered medications for this visit.  Facility-Administered Medications Ordered in Other Visits:  .  denosumab (XGEVA) injection 120 mg, 120 mg, Subcutaneous, Q30 days, Sindy Guadeloupe, MD, 120 mg at 03/05/19 1525  Physical exam:  Vitals:   12/10/19 1022  BP: 131/80  Pulse: 74  Resp: 16  Temp: 97.8 F (36.6 C)  TempSrc: Oral  Weight: 182 lb 1.6 oz (82.6 kg)  Height: 5\' 5"  (1.651 m)   Physical Exam Constitutional:      General: She is not in acute distress. Eyes:     Pupils: Pupils are equal, round, and reactive to light.  Cardiovascular:     Rate and Rhythm: Normal rate and regular rhythm.     Heart sounds: Normal heart sounds.  Pulmonary:     Effort: Pulmonary effort is normal.     Breath sounds: Normal breath sounds.  Abdominal:     General: Bowel sounds are normal.     Palpations: Abdomen is soft.  Skin:    General: Skin is warm and dry.  Neurological:     Mental Status: She is alert and oriented to person, place, and time.      CMP Latest Ref Rng & Units 12/10/2019  Glucose 70 - 99 mg/dL 101(H)  BUN 8 - 23 mg/dL 25(H)  Creatinine 0.44 - 1.00 mg/dL 0.90  Sodium 135 - 145 mmol/L 141  Potassium 3.5 - 5.1 mmol/L 3.9  Chloride 98 - 111 mmol/L 102  CO2 22 - 32 mmol/L 29  Calcium 8.9 - 10.3 mg/dL 9.3  Total Protein 6.5 - 8.1 g/dL 7.6  Total Bilirubin 0.3 - 1.2 mg/dL 0.6  Alkaline Phos 38 - 126 U/L 88  AST 15 - 41 U/L 24  ALT 0 - 44 U/L 16   CBC Latest Ref Rng & Units 12/10/2019  WBC 4.0 - 10.5 K/uL 4.0  Hemoglobin 12.0 -  15.0 g/dL 11.3(L)  Hematocrit 36 - 46 % 35.8(L)  Platelets 150 - 400 K/uL 159     Assessment and plan- Patient is a 69 y.o. female  with extensive stage small cell lung cancer and bone metastases. She is here for on treatment assessment prior  Cycle 19 of tecentriq  Counts okay to proceed with cycle 19 of Tecentriq today.  I will see her back in 3 weeks for cycle 20.  She already has a CT chest abdomen and pelvis with contrast and bone scan scheduled in1 week's time.  Autoimmune hypothyroidism: Currently patient is on 150 mcg of levothyroxine.  Last TSH was 6.1.  Will repeat again in 6 weeks time       Visit Diagnosis 1. Encounter for antineoplastic chemotherapy   2. Small cell lung cancer, right upper lobe ([place])   3. Autoimmune hypothyroidism      Dr. Randa Evens, MD, MPH [facility] [phone] 12/13/2019 5:40 PM

## 2019-12-14 ENCOUNTER — Other Ambulatory Visit: Payer: Self-pay

## 2019-12-14 ENCOUNTER — Encounter
Admission: RE | Admit: 2019-12-14 | Discharge: 2019-12-14 | Disposition: A | Discharge status: Home / Self Care | Payer: PPO | Source: Ambulatory Visit | Attending: Oncology | Admitting: Oncology

## 2019-12-14 DIAGNOSIS — C7951 Secondary malignant neoplasm of bone: Secondary | ICD-10-CM | POA: Diagnosis not present

## 2019-12-14 DIAGNOSIS — E063 Autoimmune thyroiditis: Secondary | ICD-10-CM

## 2019-12-14 DIAGNOSIS — C3411 Malignant neoplasm of upper lobe, right bronchus or lung: Secondary | ICD-10-CM | POA: Diagnosis not present

## 2019-12-14 DIAGNOSIS — C3490 Malignant neoplasm of unspecified part of unspecified bronchus or lung: Secondary | ICD-10-CM | POA: Diagnosis not present

## 2019-12-14 MED ORDER — TECHNETIUM TC 99M MEDRONATE IV KIT
20.0000 | PACK | Freq: Once | INTRAVENOUS | Status: AC | PRN
  Administered 2019-12-14: 21.114 via INTRAVENOUS

## 2019-12-15 ENCOUNTER — Ambulatory Visit
Admission: RE | Admit: 2019-12-15 | Discharge: 2019-12-15 | Disposition: A | Discharge status: Home / Self Care | Payer: PPO | Source: Ambulatory Visit | Attending: Oncology | Admitting: Oncology

## 2019-12-15 DIAGNOSIS — M4316 Spondylolisthesis, lumbar region: Secondary | ICD-10-CM | POA: Diagnosis not present

## 2019-12-15 DIAGNOSIS — K7460 Unspecified cirrhosis of liver: Secondary | ICD-10-CM | POA: Diagnosis not present

## 2019-12-15 DIAGNOSIS — C7951 Secondary malignant neoplasm of bone: Secondary | ICD-10-CM | POA: Diagnosis not present

## 2019-12-15 DIAGNOSIS — M4856XA Collapsed vertebra, not elsewhere classified, lumbar region, initial encounter for fracture: Secondary | ICD-10-CM | POA: Diagnosis not present

## 2019-12-15 DIAGNOSIS — I2510 Atherosclerotic heart disease of native coronary artery without angina pectoris: Secondary | ICD-10-CM | POA: Diagnosis not present

## 2019-12-15 DIAGNOSIS — C3490 Malignant neoplasm of unspecified part of unspecified bronchus or lung: Secondary | ICD-10-CM | POA: Diagnosis not present

## 2019-12-15 DIAGNOSIS — C3411 Malignant neoplasm of upper lobe, right bronchus or lung: Secondary | ICD-10-CM | POA: Insufficient documentation

## 2019-12-15 MED ORDER — IOHEXOL 300 MG/ML  SOLN
100.0000 mL | Freq: Once | INTRAMUSCULAR | Status: AC | PRN
  Administered 2019-12-15: 100 mL via INTRAVENOUS

## 2019-12-30 ENCOUNTER — Other Ambulatory Visit: Payer: Self-pay | Admitting: Family Medicine

## 2019-12-30 DIAGNOSIS — F331 Major depressive disorder, recurrent, moderate: Secondary | ICD-10-CM

## 2020-01-07 ENCOUNTER — Other Ambulatory Visit: Payer: Self-pay | Admitting: *Deleted

## 2020-01-07 DIAGNOSIS — C3411 Malignant neoplasm of upper lobe, right bronchus or lung: Secondary | ICD-10-CM

## 2020-01-08 ENCOUNTER — Inpatient Hospital Stay (HOSPITAL_BASED_OUTPATIENT_CLINIC_OR_DEPARTMENT_OTHER): Payer: PPO | Admitting: Oncology

## 2020-01-08 ENCOUNTER — Encounter: Payer: Self-pay | Admitting: Oncology

## 2020-01-08 ENCOUNTER — Inpatient Hospital Stay: Payer: PPO | Attending: Oncology

## 2020-01-08 ENCOUNTER — Inpatient Hospital Stay: Payer: PPO

## 2020-01-08 VITALS — BP 140/78 | HR 85 | Temp 97.6°F | Resp 16 | Wt 174.5 lb

## 2020-01-08 DIAGNOSIS — E785 Hyperlipidemia, unspecified: Secondary | ICD-10-CM | POA: Insufficient documentation

## 2020-01-08 DIAGNOSIS — C7951 Secondary malignant neoplasm of bone: Secondary | ICD-10-CM | POA: Insufficient documentation

## 2020-01-08 DIAGNOSIS — Z87891 Personal history of nicotine dependence: Secondary | ICD-10-CM | POA: Diagnosis not present

## 2020-01-08 DIAGNOSIS — E669 Obesity, unspecified: Secondary | ICD-10-CM | POA: Diagnosis not present

## 2020-01-08 DIAGNOSIS — C3411 Malignant neoplasm of upper lobe, right bronchus or lung: Secondary | ICD-10-CM | POA: Insufficient documentation

## 2020-01-08 DIAGNOSIS — K7460 Unspecified cirrhosis of liver: Secondary | ICD-10-CM | POA: Insufficient documentation

## 2020-01-08 DIAGNOSIS — Z79899 Other long term (current) drug therapy: Secondary | ICD-10-CM | POA: Diagnosis not present

## 2020-01-08 DIAGNOSIS — E063 Autoimmune thyroiditis: Secondary | ICD-10-CM

## 2020-01-08 DIAGNOSIS — I10 Essential (primary) hypertension: Secondary | ICD-10-CM | POA: Insufficient documentation

## 2020-01-08 DIAGNOSIS — Z8041 Family history of malignant neoplasm of ovary: Secondary | ICD-10-CM | POA: Insufficient documentation

## 2020-01-08 DIAGNOSIS — Z923 Personal history of irradiation: Secondary | ICD-10-CM | POA: Diagnosis not present

## 2020-01-08 DIAGNOSIS — Z9049 Acquired absence of other specified parts of digestive tract: Secondary | ICD-10-CM | POA: Insufficient documentation

## 2020-01-08 DIAGNOSIS — Z79890 Hormone replacement therapy (postmenopausal): Secondary | ICD-10-CM | POA: Diagnosis not present

## 2020-01-08 DIAGNOSIS — Z5112 Encounter for antineoplastic immunotherapy: Secondary | ICD-10-CM

## 2020-01-08 LAB — CBC WITH DIFFERENTIAL/PLATELET
Abs Immature Granulocytes: 0.02 10*3/uL (ref 0.00–0.07)
Basophils Absolute: 0 10*3/uL (ref 0.0–0.1)
Basophils Relative: 1 %
Eosinophils Absolute: 0 10*3/uL (ref 0.0–0.5)
Eosinophils Relative: 0 %
HCT: 40.7 % (ref 36.0–46.0)
Hemoglobin: 12.7 g/dL (ref 12.0–15.0)
Immature Granulocytes: 0 %
Lymphocytes Relative: 16 %
Lymphs Abs: 0.9 10*3/uL (ref 0.7–4.0)
MCH: 27 pg (ref 26.0–34.0)
MCHC: 31.2 g/dL (ref 30.0–36.0)
MCV: 86.4 fL (ref 80.0–100.0)
Monocytes Absolute: 0.4 10*3/uL (ref 0.1–1.0)
Monocytes Relative: 8 %
Neutro Abs: 3.9 10*3/uL (ref 1.7–7.7)
Neutrophils Relative %: 75 %
Platelets: 171 10*3/uL (ref 150–400)
RBC: 4.71 MIL/uL (ref 3.87–5.11)
RDW: 15.9 % — ABNORMAL HIGH (ref 11.5–15.5)
WBC: 5.3 10*3/uL (ref 4.0–10.5)
nRBC: 0 % (ref 0.0–0.2)

## 2020-01-08 LAB — COMPREHENSIVE METABOLIC PANEL
ALT: 17 U/L (ref 0–44)
AST: 27 U/L (ref 15–41)
Albumin: 4.1 g/dL (ref 3.5–5.0)
Alkaline Phosphatase: 69 U/L (ref 38–126)
Anion gap: 12 (ref 5–15)
BUN: 20 mg/dL (ref 8–23)
CO2: 28 mmol/L (ref 22–32)
Calcium: 9.6 mg/dL (ref 8.9–10.3)
Chloride: 99 mmol/L (ref 98–111)
Creatinine, Ser: 0.77 mg/dL (ref 0.44–1.00)
GFR, Estimated: >60 mL/min (ref >60)
Glucose, Bld: 120 mg/dL — ABNORMAL HIGH (ref 70–99)
Potassium: 3.6 mmol/L (ref 3.5–5.1)
Sodium: 139 mmol/L (ref 135–145)
Total Bilirubin: 1 mg/dL (ref 0.3–1.2)
Total Protein: 7.9 g/dL (ref 6.5–8.1)

## 2020-01-08 MED ORDER — SODIUM CHLORIDE 0.9% FLUSH
10.0000 mL | Freq: Once | INTRAVENOUS | Status: AC
  Administered 2020-01-08: 10 mL via INTRAVENOUS
  Filled 2020-01-08: qty 10

## 2020-01-08 MED ORDER — HEPARIN SOD (PORK) LOCK FLUSH 100 UNIT/ML IV SOLN
500.0000 [IU] | Freq: Once | INTRAVENOUS | Status: AC | PRN
  Administered 2020-01-08: 500 [IU]
  Filled 2020-01-08: qty 5

## 2020-01-08 MED ORDER — SODIUM CHLORIDE 0.9 % IV SOLN
1200.0000 mg | Freq: Once | INTRAVENOUS | Status: AC
  Administered 2020-01-08: 1200 mg via INTRAVENOUS
  Filled 2020-01-08: qty 20

## 2020-01-08 MED ORDER — HEPARIN SOD (PORK) LOCK FLUSH 100 UNIT/ML IV SOLN
INTRAVENOUS | Status: AC
  Filled 2020-01-08: qty 5

## 2020-01-08 MED ORDER — SODIUM CHLORIDE 0.9 % IV SOLN
Freq: Once | INTRAVENOUS | Status: AC
  Filled 2020-01-08: qty 250

## 2020-01-08 NOTE — Progress Notes (Signed)
Pt tolerated infusion well. Pt stable at discharge. 

## 2020-01-08 NOTE — Progress Notes (Signed)
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 She has been referred to [place] for findings of bone metastases and her recent MRI. She has a prior history of 3 packs/day day smoking for over 45 years and quit smoking 5 years ago.She had a CT chest abdomen pelvis in 2018 which showed a 5 mm lung nodule in the left lower lobe. Recently over the last 2 months patient has been having worsening back pain and was referred to orthopedics. 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 Pathologic fracture of S1.  PET scan showed 2 RUL lung nodules, hilar and mediastinal adenopathy and widespread bone mets. MRI brain negative.  Patientcompletedpalliative RT to her spine. Bronchoscopy showed small cell lung cancer. Palliative carboplatin, etoposide and Tecentriq started on 08/18/2018. Scans after 4 cycles showed stable disease. She is on maintenance tecentriq   Interval history-patient reports doing well.  Denies any pain.  Appetite and weight have remained stable.  She is remaining compliant with her levothyroxine  ECOG PS- 1 Pain  scale- 0   Review of systems- Review of Systems  Constitutional: Negative for chills, fever, malaise/fatigue and weight loss.  HENT: Negative for congestion, ear discharge and nosebleeds.   Eyes: Negative for blurred vision.  Respiratory: Negative for cough, hemoptysis, sputum production, shortness of breath and wheezing.   Cardiovascular: Negative for chest pain, palpitations, orthopnea and claudication.  Gastrointestinal: Negative for abdominal pain, blood in stool, constipation, diarrhea, heartburn, melena, nausea and vomiting.  Genitourinary: Negative for dysuria, flank pain, frequency, hematuria and urgency.  Musculoskeletal: Negative for back pain, joint pain and myalgias.  Skin: Negative for rash.  Neurological: Negative for dizziness, tingling, focal weakness, seizures, weakness and headaches.  Endo/Heme/Allergies: Does not bruise/bleed easily.  Psychiatric/Behavioral: Negative for depression and suicidal ideas. The patient does not have insomnia.       Allergies  Allergen Reactions  . Bee Venom Swelling     Past Medical History:  Diagnosis Date  . Arthritis   . Cirrhosis ([place])   . Depression   . GERD (gastroesophageal reflux disease)   . Hyperlipidemia   . Hypertension   . Lung cancer ([place])   . Metastatic bone cancer [facility])      Past Surgical History:  Procedure Laterality Date  . APPENDECTOMY  1971  . CHOLECYSTECTOMY  1971  . COLONOSCOPY WITH PROPOFOL N/A 11/15/2016   Procedure: COLONOSCOPY WITH PROPOFOL;  Surgeon: Jonathon Bellows, MD;  Location: [facility] ENDOSCOPY;  Service: Gastroenterology;  Laterality: N/A;  . ENDOBRONCHIAL ULTRASOUND Right 07/30/2018   Procedure: ENDOBRONCHIAL ULTRASOUND;  Surgeon: Laverle Hobby, MD;  Location: [facility] ORS;  Service: Pulmonary;  Laterality: Right;  . ESOPHAGOGASTRODUODENOSCOPY (EGD) WITH PROPOFOL N/A 01/07/2017   Procedure: ESOPHAGOGASTRODUODENOSCOPY (EGD) WITH  PROPOFOL;  Surgeon: Jonathon Bellows, MD;  Location: [facility] ENDOSCOPY;   Service: Gastroenterology;  Laterality: N/A;  . LAPAROSCOPY N/A 03/01/2017   Procedure: LAPAROSCOPY DIAGNOSTIC;  Surgeon: Robert Bellow, MD;  Location: [facility] ORS;  Service: General;  Laterality: N/A;  . PORTA CATH INSERTION N/A 08/14/2018   Procedure: PORTA CATH INSERTION;  Surgeon: Algernon Huxley, MD;  Location: [place] CV LAB;  Service: Cardiovascular;  Laterality: N/A;  . TONSILECTOMY, ADENOIDECTOMY, BILATERAL MYRINGOTOMY [place]  . TONSILLECTOMY    . VENTRAL HERNIA REPAIR N/A 03/01/2017   10 x 14 CM Ventralight ST mesh, intraperitoneal location.   . VENTRAL HERNIA REPAIR N/A 03/01/2017   Procedure: HERNIA REPAIR VENTRAL ADULT;  Surgeon: Robert Bellow, MD;  Location: [facility] ORS;  Service: General;  Laterality: N/A;    Social History   Socioeconomic History  . Marital status: Married    Spouse name: [name]  . Number of children: Not on file  . Years of education: Not on file  . Highest education level: Not on file  Occupational History  . Not on file  Tobacco Use  . Smoking status: Former Smoker    Packs/day: 2.00    Years: 50.00    Pack years: 100.00    Types: Cigarettes, E-cigarettes    Quit date: 02/07/2012    Years since quitting: 7.9  . Smokeless tobacco: Never Used  Vaping Use  . Vaping Use: Some days  . Start date: 10/06/2013  . Devices: uses no liquid  Substance and Sexual Activity  . Alcohol use: No    Alcohol/week: 0.0 standard drinks  . Drug use: No  . Sexual activity: Yes  Other Topics Concern  . Not on file  Social History Narrative   Married   Retired   Clinical cytogeneticist level of education    No children    1 cup of coffee   Social Determinants of Radio broadcast assistant Strain: Low Risk   . Difficulty of Paying Living Expenses: Not hard at all  Food Insecurity: No Food Insecurity  . Worried About Charity fundraiser in the Last Year: Never true  . Ran Out of Food in the Last Year: Never true  Transportation Needs: No  Transportation Needs  . Lack of Transportation (Medical): No  . Lack of Transportation (Non-Medical): No  Physical Activity:   . Days of Exercise per Week: Not on file  . Minutes of Exercise per Session: Not on file  Stress: No Stress Concern Present  . Feeling of Stress : Not at all  Social Connections:   . Frequency of Communication with Friends and Family: Not on file  . Frequency of Social Gatherings with Friends and Family: Not on file  . Attends Religious Services: Not on file  . Active Member of Clubs or Organizations: Not on file  . Attends Archivist Meetings: Not on file  . Marital Status: Not on file  Intimate Partner Violence: Not At Risk  . Fear of Current or Ex-Partner: No  . Emotionally Abused: No  . Physically Abused: No  . Sexually Abused: No    Family History  Problem Relation Age of Onset  . Hypertension Mother   . Ovarian cancer Mother 47  . Heart disease Father   . Stroke Father   . Ovarian cancer Sister        ? dx cancer had hyst.  . Breast cancer Neg Hx      Current Outpatient  Medications:  .  buPROPion (WELLBUTRIN XL) 300 MG 24 hr tablet, Take 1 tablet by mouth once daily, Disp: 90 tablet, Rfl: 0 .  escitalopram (LEXAPRO) 10 MG tablet, Take 1 tablet by mouth once daily, Disp: 90 tablet, Rfl: 0 .  folic acid (FOLVITE) 1 MG tablet, Take 1 tablet (1 mg total) by mouth daily., Disp: 90 tablet, Rfl: 1 .  ibuprofen (ADVIL,MOTRIN) 200 MG tablet, Take 400 mg by mouth every 8 (eight) hours as needed for headache or moderate pain. , Disp: , Rfl:  .  levothyroxine (SYNTHROID) 150 MCG tablet, Take 1 tablet (150 mcg total) by mouth daily before breakfast., Disp: 30 tablet, Rfl: 3 .  lidocaine-prilocaine (EMLA) cream, Apply to affected area once, Disp: 30 g, Rfl: 3 .  Multiple Vitamins-Minerals (PRESERVISION AREDS 2) CAPS, Take 1 capsule by mouth 2 (two) times a day. , Disp: , Rfl:  .  rosuvastatin (CRESTOR) 20 MG tablet, Take 1 tablet by mouth once  daily, Disp: 90 tablet, Rfl: 0 .  loperamide (IMODIUM) 2 MG capsule, Take 1 capsule (2 mg total) by mouth every 2 (two) hours as needed for diarrhea or loose stools. Do not exceed 16mg  daily. (Patient not taking: Reported on 12/10/2019), Disp: 30 capsule, Rfl: 1 .  polyethylene glycol (MIRALAX / GLYCOLAX) 17 g packet, Take 17 g by mouth daily as needed for mild constipation. (Patient not taking: Reported on 12/10/2019), Disp: 14 each, Rfl: 0 No current facility-administered medications for this visit.  Facility-Administered Medications Ordered in Other Visits:  .  denosumab (XGEVA) injection 120 mg, 120 mg, Subcutaneous, Q30 days, Sindy Guadeloupe, MD, 120 mg at 03/05/19 1525  Physical exam:  Vitals:   01/08/20 0915  BP: 140/78  Pulse: 85  Resp: 16  Temp: 97.6 F (36.4 C)  TempSrc: Tympanic  SpO2: 92%  Weight: 174 lb 8 oz (79.2 kg)   Physical Exam Constitutional:      General: She is not in acute distress. Cardiovascular:     Rate and Rhythm: Normal rate and regular rhythm.     Heart sounds: Normal heart sounds.  Pulmonary:     Effort: Pulmonary effort is normal.     Breath sounds: Normal breath sounds.  Abdominal:     General: Bowel sounds are normal.     Palpations: Abdomen is soft.  Skin:    General: Skin is warm and dry.  Neurological:     Mental Status: She is alert and oriented to person, place, and time.      CMP Latest Ref Rng & Units 01/08/2020  Glucose 70 - 99 mg/dL 120(H)  BUN 8 - 23 mg/dL 20  Creatinine 0.44 - 1.00 mg/dL 0.77  Sodium 135 - 145 mmol/L 139  Potassium 3.5 - 5.1 mmol/L 3.6  Chloride 98 - 111 mmol/L 99  CO2 22 - 32 mmol/L 28  Calcium 8.9 - 10.3 mg/dL 9.6  Total Protein 6.5 - 8.1 g/dL 7.9  Total Bilirubin 0.3 - 1.2 mg/dL 1.0  Alkaline Phos 38 - 126 U/L 69  AST 15 - 41 U/L 27  ALT 0 - 44 U/L 17   CBC Latest Ref Rng & Units 01/08/2020  WBC 4.0 - 10.5 K/uL 5.3  Hemoglobin 12.0 - 15.0 g/dL 12.7  Hematocrit 36 - 46 % 40.7  Platelets 150 - 400 K/uL  171    No images are attached to the encounter.  NM Bone Scan Whole Body  Result Date: 12/15/2019 CLINICAL DATA:  Lung cancer with osseous  metastatic disease, restaging EXAM: NUCLEAR MEDICINE WHOLE BODY BONE SCAN TECHNIQUE: Whole body anterior and posterior images were obtained approximately 3 hours after intravenous injection of radiopharmaceutical. RADIOPHARMACEUTICALS:  21.1 mCi Technetium-25m MDP IV COMPARISON:  09/07/2019 FINDINGS: Stable accentuated activity along the ribs (left greater than right), scattered throughout the spine and bony pelvis, and in the calvarium, compatible with known osseous metastatic disease. Distribution unchanged. Activity along the ankles, knees, and shoulders is likely degenerative. Accentuated activity in the shaft of the left and right humerus as well as in the midshaft of the femurs suspicious for probable metastatic involvement. IMPRESSION: 1. Unchanged appearance of bilateral widespread osseous metastatic disease involving the axial and appendicular skeleton. Electronically Signed   By: Van Clines M.D.   On: 12/15/2019 13:21   CT CHEST ABDOMEN PELVIS W CONTRAST  Result Date: 12/15/2019 CLINICAL DATA:  Metastatic small cell lung cancer restaging EXAM: CT CHEST, ABDOMEN, AND PELVIS WITH CONTRAST TECHNIQUE: Multidetector CT imaging of the chest, abdomen and pelvis was performed following the standard protocol during bolus administration of intravenous contrast. CONTRAST:  126mL OMNIPAQUE IOHEXOL 300 MG/ML  SOLN COMPARISON:  Multiple exams, including 09/07/2019 FINDINGS: CT CHEST FINDINGS Cardiovascular: Right Port-A-Cath tip: SVC. Coronary, aortic arch, and branch vessel atherosclerotic vascular disease. Mediastinum/Nodes: Diminutive or absent thyroid tissue, as before. No pathologic adenopathy. Lungs/Pleura: Stable 5 by 3 mm right upper lobe pulmonary nodule on image 39 of series 4. This is stable from 09/07/2019 but measured 0.6 by 0.5 cm back on 09/22/2018.  Chronically stable 3 mm subpleural nodule in the right upper lobe on image 30 of series 4, no change from 01/02/2017. Musculoskeletal: Stable scattered osseous metastatic lesions throughout the thoracic spine. Chronic endplate compressions at T2. Chronic mildly sclerotic rib lesions more notable on the left than the right. CT ABDOMEN PELVIS FINDINGS Hepatobiliary: Hepatic cirrhosis. No discrete hepatic mass is identified. Patent portal vein. Cholecystectomy. No biliary dilatation. Pancreas: Unremarkable Spleen: Unremarkable Adrenals/Urinary Tract: 0.9 cm hypodense lesion of the left mid kidney on image 63 of series 2 similar to prior and probably a cyst. Adrenal glands normal. Stomach/Bowel: Circumferential wall thickening in the distal gastric body and antrum on the portal venous phase images appears improved on delayed phase images and hence is most compatible with peristaltic activity. Transverse duodenal diverticula. Vascular/Lymphatic: Aortoiliac atherosclerotic vascular disease. No pathologic adenopathy identified. Reproductive: Unremarkable Other: No supplemental non-categorized findings. Musculoskeletal: Stable scattered sclerotic metastatic lesions in the lumbar spine and bony pelvis. Stable lumbar endplate compression fractures most notable at the L2 level and along the superior endplate of S1. Grade 1 anterolisthesis at L4-5 is unchanged. IMPRESSION: 1. Stable scattered sclerotic metastatic lesions throughout the skeleton. 2. Stable small right upper lobe pulmonary nodules. One of these is unchanged back from 2018 and likely benign the 5 by 3 mm right upper lobe pulmonary nodule is stable from 09/07/2019, but was larger back on 09/22/2018. 3. Other imaging findings of potential clinical significance: Coronary atherosclerosis. Hepatic cirrhosis. Stable multilevel endplate compression fractures in the thoracolumbar spine. 4. Aortic atherosclerosis. Aortic Atherosclerosis (ICD10-I70.0). Electronically Signed    By: Van Clines M.D.   On: 12/15/2019 13:18     Assessment and plan- Patient is a 69 y.o. female with extensive stage small cell lung cancer and bone metastases.   She is here for on treatment assessment prior to cycle 20 of maintenance Tecentriq  Counts okay to proceed with cycle 20 of maintenance Tecentriq today.  I will see her back in 3 weeks for cycle  21.Patient had a recent CT chest abdomen pelvis with contrast as well as a bone scan which overall shows stable disease and stable bone metastases.  Bone metastases: Patient developed significant hypocalcemia after receiving Xgeva and therefore has not been getting it since then.  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  Will check levels again after 6 weeks   Visit Diagnosis 1. Small cell lung cancer, right upper lobe ([place])   2. Encounter for antineoplastic immunotherapy   3. Autoimmune hypothyroidism      Dr. Randa Evens, MD, MPH [facility] at [facility] [phone] 01/08/2020 12:21 PM

## 2020-01-11 DIAGNOSIS — H353210 Exudative age-related macular degeneration, right eye, stage unspecified: Secondary | ICD-10-CM | POA: Diagnosis not present

## 2020-01-12 ENCOUNTER — Other Ambulatory Visit: Payer: Self-pay | Admitting: Family Medicine

## 2020-01-12 DIAGNOSIS — F331 Major depressive disorder, recurrent, moderate: Secondary | ICD-10-CM

## 2020-01-19 ENCOUNTER — Other Ambulatory Visit: Payer: Self-pay | Admitting: Family Medicine

## 2020-01-22 ENCOUNTER — Other Ambulatory Visit: Payer: Self-pay | Admitting: Family Medicine

## 2020-01-22 ENCOUNTER — Other Ambulatory Visit: Payer: Self-pay

## 2020-01-26 ENCOUNTER — Other Ambulatory Visit: Payer: Self-pay

## 2020-01-26 ENCOUNTER — Encounter: Payer: Self-pay | Admitting: Family Medicine

## 2020-01-26 ENCOUNTER — Ambulatory Visit (INDEPENDENT_AMBULATORY_CARE_PROVIDER_SITE_OTHER): Payer: PPO | Admitting: Family Medicine

## 2020-01-26 DIAGNOSIS — C3411 Malignant neoplasm of upper lobe, right bronchus or lung: Secondary | ICD-10-CM

## 2020-01-26 DIAGNOSIS — F331 Major depressive disorder, recurrent, moderate: Secondary | ICD-10-CM | POA: Diagnosis not present

## 2020-01-26 DIAGNOSIS — E032 Hypothyroidism due to medicaments and other exogenous substances: Secondary | ICD-10-CM | POA: Diagnosis not present

## 2020-01-26 DIAGNOSIS — I10 Essential (primary) hypertension: Secondary | ICD-10-CM

## 2020-01-26 MED ORDER — AMLODIPINE BESYLATE 5 MG PO TABS: 5.0000 mg | ORAL_TABLET | Freq: Every day | ORAL | 3 refills | Status: DC

## 2020-01-26 NOTE — Assessment & Plan Note (Signed)
We will start back on amlodipine 5 mg once daily.  Follow-up in 1 month.

## 2020-01-26 NOTE — Progress Notes (Signed)
  Tommi Rumps, MD Phone: [phone]  Anne [name] is a 69 y.o. female who presents today for follow-up.  Depression: Patient notes no depression.  Lexapro and Wellbutrin have been beneficial.  No SI.  Lung cancer: She has metastatic lung cancer to the bones.  She notes no bone pain.  Feels well overall.  Continues treatments every 3 weeks with oncology.  Hypothyroidism: Continuing on Synthroid.  No skin changes.  No heat or cold intolerance.  Most recent TSH was mildly elevated.  Her oncologist noted that would be checking her TSH in 6 weeks.  Hypertension: Blood pressure has been elevated to the 140/80 range at home.  Not on medication.  Social History   Tobacco Use  Smoking Status Former Smoker  . Packs/day: 2.00  . Years: 50.00  . Pack years: 100.00  . Types: Cigarettes, E-cigarettes  . Quit date: 02/07/2012  . Years since quitting: 7.9  Smokeless Tobacco Never Used     ROS see history of present illness  Objective  Physical Exam Vitals:   01/26/20 1158  BP: 140/80  Pulse: 90  Temp: 98.7 F (37.1 C)  SpO2: 96%    BP Readings from Last 3 Encounters:  01/26/20 140/80  01/08/20 140/78  12/10/19 131/80   Wt Readings from Last 3 Encounters:  01/26/20 177 lb 12.8 oz (80.6 kg)  01/08/20 174 lb 8 oz (79.2 kg)  12/10/19 182 lb 1.6 oz (82.6 kg)    Physical Exam Constitutional:      General: She is not in acute distress.    Appearance: She is not diaphoretic.  Cardiovascular:     Rate and Rhythm: Normal rate and regular rhythm.     Heart sounds: Normal heart sounds.  Pulmonary:     Effort: Pulmonary effort is normal.     Breath sounds: Normal breath sounds.  Musculoskeletal:        General: No edema.  Skin:    General: Skin is warm and dry.  Neurological:     Mental Status: She is alert.      Assessment/Plan: Please see individual problem list.  Problem List Items Addressed This Visit    Benign essential HTN    We will start back on amlodipine  5 mg once daily.  Follow-up in 1 month.      Relevant Medications   amLODipine (NORVASC) 5 MG tablet   Depression    Well-controlled.  Continue Lexapro 10 mg once daily and bupropion 300 mg once daily.      Hypothyroidism    Continue Synthroid 150 mcg daily.  Plan for TSH with next oncology lab check.      Small cell lung cancer, right upper lobe ([place])    Overall doing physically well.  She will continue to see oncology.         This visit occurred during the SARS-CoV-2 public health emergency.  Safety protocols were in place, including screening questions prior to the visit, additional usage of staff PPE, and extensive cleaning of exam room while observing appropriate contact time as indicated for disinfecting solutions.    Tommi Rumps, MD [place]

## 2020-01-26 NOTE — Patient Instructions (Signed)
Nice to see you. Please have your TSH checked with your next oncology labs. We will start you on amlodipine for your blood pressure.  I will see you back in a month.

## 2020-01-26 NOTE — Assessment & Plan Note (Signed)
Continue Synthroid 150 mcg daily.  Plan for TSH with next oncology lab check.

## 2020-01-26 NOTE — Assessment & Plan Note (Signed)
Well-controlled.  Continue Lexapro 10 mg once daily and bupropion 300 mg once daily.

## 2020-01-26 NOTE — Assessment & Plan Note (Signed)
Overall doing physically well.  She will continue to see oncology.

## 2020-01-27 ENCOUNTER — Telehealth: Payer: Self-pay | Admitting: Family Medicine

## 2020-01-27 NOTE — Telephone Encounter (Signed)
Pt does not remember which covid vaccine she received however she has an appt tomorrow and will bring her card

## 2020-01-28 ENCOUNTER — Inpatient Hospital Stay: Payer: PPO

## 2020-01-28 ENCOUNTER — Encounter: Payer: Self-pay | Admitting: Oncology

## 2020-01-28 ENCOUNTER — Inpatient Hospital Stay (HOSPITAL_BASED_OUTPATIENT_CLINIC_OR_DEPARTMENT_OTHER): Payer: PPO | Admitting: Oncology

## 2020-01-28 ENCOUNTER — Other Ambulatory Visit: Payer: Self-pay

## 2020-01-28 VITALS — BP 140/82 | HR 72 | Temp 97.8°F | Resp 18 | Wt 177.2 lb

## 2020-01-28 DIAGNOSIS — Z5112 Encounter for antineoplastic immunotherapy: Secondary | ICD-10-CM | POA: Diagnosis not present

## 2020-01-28 DIAGNOSIS — C3411 Malignant neoplasm of upper lobe, right bronchus or lung: Secondary | ICD-10-CM

## 2020-01-28 DIAGNOSIS — E063 Autoimmune thyroiditis: Secondary | ICD-10-CM | POA: Diagnosis not present

## 2020-01-28 LAB — COMPREHENSIVE METABOLIC PANEL
ALT: 15 U/L (ref 0–44)
AST: 21 U/L (ref 15–41)
Albumin: 3.8 g/dL (ref 3.5–5.0)
Alkaline Phosphatase: 67 U/L (ref 38–126)
Anion gap: 10 (ref 5–15)
BUN: 19 mg/dL (ref 8–23)
CO2: 29 mmol/L (ref 22–32)
Calcium: 9 mg/dL (ref 8.9–10.3)
Chloride: 99 mmol/L (ref 98–111)
Creatinine, Ser: 0.83 mg/dL (ref 0.44–1.00)
GFR, Estimated: >60 mL/min (ref >60)
Glucose, Bld: 97 mg/dL (ref 70–99)
Potassium: 3.5 mmol/L (ref 3.5–5.1)
Sodium: 138 mmol/L (ref 135–145)
Total Bilirubin: 0.4 mg/dL (ref 0.3–1.2)
Total Protein: 7.1 g/dL (ref 6.5–8.1)

## 2020-01-28 LAB — CBC WITH DIFFERENTIAL/PLATELET
Abs Immature Granulocytes: 0.01 10*3/uL (ref 0.00–0.07)
Basophils Absolute: 0 10*3/uL (ref 0.0–0.1)
Basophils Relative: 1 %
Eosinophils Absolute: 0 10*3/uL (ref 0.0–0.5)
Eosinophils Relative: 0 %
HCT: 37.3 % (ref 36.0–46.0)
Hemoglobin: 11.8 g/dL — ABNORMAL LOW (ref 12.0–15.0)
Immature Granulocytes: 0 %
Lymphocytes Relative: 21 %
Lymphs Abs: 0.9 10*3/uL (ref 0.7–4.0)
MCH: 27 pg (ref 26.0–34.0)
MCHC: 31.6 g/dL (ref 30.0–36.0)
MCV: 85.4 fL (ref 80.0–100.0)
Monocytes Absolute: 0.3 10*3/uL (ref 0.1–1.0)
Monocytes Relative: 7 %
Neutro Abs: 3.1 10*3/uL (ref 1.7–7.7)
Neutrophils Relative %: 71 %
Platelets: 143 10*3/uL — ABNORMAL LOW (ref 150–400)
RBC: 4.37 MIL/uL (ref 3.87–5.11)
RDW: 15.5 % (ref 11.5–15.5)
WBC: 4.4 10*3/uL (ref 4.0–10.5)
nRBC: 0 % (ref 0.0–0.2)

## 2020-01-28 MED ORDER — SODIUM CHLORIDE 0.9 % IV SOLN
Freq: Once | INTRAVENOUS | Status: AC
  Filled 2020-01-28: qty 250

## 2020-01-28 MED ORDER — HEPARIN SOD (PORK) LOCK FLUSH 100 UNIT/ML IV SOLN
500.0000 [IU] | Freq: Once | INTRAVENOUS | Status: AC
  Administered 2020-01-28: 11:00:00 500 [IU] via INTRAVENOUS
  Filled 2020-01-28: qty 5

## 2020-01-28 MED ORDER — SODIUM CHLORIDE 0.9% FLUSH
10.0000 mL | INTRAVENOUS | Status: DC | PRN
  Administered 2020-01-28: 09:00:00 10 mL via INTRAVENOUS
  Filled 2020-01-28: qty 10

## 2020-01-28 MED ORDER — SODIUM CHLORIDE 0.9 % IV SOLN
1200.0000 mg | Freq: Once | INTRAVENOUS | Status: AC
  Administered 2020-01-28: 10:00:00 1200 mg via INTRAVENOUS
  Filled 2020-01-28: qty 20

## 2020-01-28 MED ORDER — HEPARIN SOD (PORK) LOCK FLUSH 100 UNIT/ML IV SOLN
INTRAVENOUS | Status: AC
  Filled 2020-01-28: qty 5

## 2020-01-28 NOTE — Progress Notes (Signed)
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 She has been referred to [place] for findings of bone metastases and her recent MRI. She has a prior history of 3 packs/day day smoking for over 45 years and quit smoking 5 years ago.She had a CT chest abdomen pelvis in 2018 which showed a 5 mm lung nodule in the left lower lobe. Recently over the last 2 months patient has been having worsening back pain and was referred to orthopedics. 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 Pathologic fracture of S1.  PET scan showed 2 RUL lung nodules, hilar and mediastinal adenopathy and widespread bone mets. MRI brain negative.  Patientcompletedpalliative RT to her spine. Bronchoscopy showed small cell lung cancer. Palliative carboplatin, etoposide and Tecentriq started on 08/18/2018. Scans after 4 cycles showed stable disease. She is on maintenance tecentriq   Interval history-patient reports doing well and denies any complaints at this time.  Appetite and weight is good.  Denies any back pain ECOG PS- 1 Pain scale- 0   Review of  systems- Review of Systems  Constitutional: Negative for chills, fever, malaise/fatigue and weight loss.  HENT: Negative for congestion, ear discharge and nosebleeds.   Eyes: Negative for blurred vision.  Respiratory: Negative for cough, hemoptysis, sputum production, shortness of breath and wheezing.   Cardiovascular: Negative for chest pain, palpitations, orthopnea and claudication.  Gastrointestinal: Negative for abdominal pain, blood in stool, constipation, diarrhea, heartburn, melena, nausea and vomiting.  Genitourinary: Negative for dysuria, flank pain, frequency, hematuria and urgency.  Musculoskeletal: Negative for back pain, joint pain and myalgias.  Skin: Negative for rash.  Neurological: Negative for dizziness, tingling, focal weakness, seizures, weakness and headaches.  Endo/Heme/Allergies: Does not bruise/bleed easily.  Psychiatric/Behavioral: Negative for depression and suicidal ideas. The patient does not have insomnia.       Allergies  Allergen Reactions  . Bee Venom Swelling     Past Medical History:  Diagnosis Date  . Arthritis   . Cirrhosis ([place])   . Depression   . GERD (gastroesophageal reflux disease)   . Hyperlipidemia   . Hypertension   . Lung cancer ([place])   . Metastatic bone cancer [facility])      Past Surgical History:  Procedure Laterality Date  . APPENDECTOMY  1971  . CHOLECYSTECTOMY  1971  . COLONOSCOPY WITH PROPOFOL N/A 11/15/2016   Procedure: COLONOSCOPY WITH PROPOFOL;  Surgeon: Jonathon Bellows, MD;  Location: [facility] ENDOSCOPY;  Service: Gastroenterology;  Laterality: N/A;  . ENDOBRONCHIAL ULTRASOUND Right 07/30/2018   Procedure: ENDOBRONCHIAL ULTRASOUND;  Surgeon: Laverle Hobby, MD;  Location: [facility] ORS;  Service: Pulmonary;  Laterality: Right;  . ESOPHAGOGASTRODUODENOSCOPY (EGD) WITH PROPOFOL N/A 01/07/2017   Procedure: ESOPHAGOGASTRODUODENOSCOPY (EGD) WITH PROPOFOL;  Surgeon: Jonathon Bellows, MD;  Location: [facility] ENDOSCOPY;  Service: Gastroenterology;   Laterality: N/A;  . LAPAROSCOPY N/A 03/01/2017   Procedure: LAPAROSCOPY DIAGNOSTIC;  Surgeon: Robert Bellow, MD;  Location: [facility] ORS;  Service: General;  Laterality: N/A;  . PORTA CATH INSERTION N/A 08/14/2018   Procedure: PORTA CATH INSERTION;  Surgeon: Algernon Huxley, MD;  Location: [place] CV LAB;  Service: Cardiovascular;  Laterality: N/A;  . TONSILECTOMY, ADENOIDECTOMY, BILATERAL MYRINGOTOMY [place]  . TONSILLECTOMY    . VENTRAL HERNIA REPAIR N/A 03/01/2017   10 x 14 CM Ventralight ST mesh, intraperitoneal location.   . VENTRAL HERNIA REPAIR N/A 03/01/2017   Procedure: HERNIA REPAIR VENTRAL ADULT;  Surgeon: Robert Bellow, MD;  Location: [facility] ORS;  Service: General;  Laterality: N/A;    Social History   Socioeconomic History  . Marital status: Married    Spouse name: [name]  . Number of children: Not on file  . Years of education: Not on file  . Highest education level: Not on file  Occupational History  . Not on file  Tobacco Use  . Smoking status: Former Smoker    Packs/day: 2.00    Years: 50.00    Pack years: 100.00    Types: Cigarettes, E-cigarettes    Quit date: 02/07/2012    Years since quitting: 7.9  . Smokeless tobacco: Never Used  Vaping Use  . Vaping Use: Some days  . Start date: 10/06/2013  . Devices: uses no liquid  Substance and Sexual Activity  . Alcohol use: No    Alcohol/week: 0.0 standard drinks  . Drug use: No  . Sexual activity: Yes  Other Topics Concern  . Not on file  Social History Narrative   Married   Retired   Clinical cytogeneticist level of education    No children    1 cup of coffee   Social Determinants of Radio broadcast assistant Strain: Low Risk   . Difficulty of Paying Living Expenses: Not hard at all  Food Insecurity: No Food Insecurity  . Worried About Charity fundraiser in the Last Year: Never true  . Ran Out of Food in the Last Year: Never true  Transportation Needs: No Transportation Needs  . Lack of  Transportation (Medical): No  . Lack of Transportation (Non-Medical): No  Physical Activity: Not on file  Stress: No Stress Concern Present  . Feeling of Stress : Not at all  Social Connections: Not on file  Intimate Partner Violence: Not At Risk  . Fear of Current or Ex-Partner: No  . Emotionally Abused: No  . Physically Abused: No  . Sexually Abused: No    Family History  Problem Relation Age of Onset  . Hypertension Mother   . Ovarian cancer Mother 29  . Heart disease Father   . Stroke Father   . Ovarian cancer Sister        ? dx cancer had hyst.  . Breast cancer Neg Hx      Current Outpatient Medications:  .  amLODipine (NORVASC) 5 MG tablet, Take 1 tablet (5 mg total) by mouth daily., Disp: 90 tablet, Rfl: 3 .  buPROPion (WELLBUTRIN XL) 300 MG 24 hr tablet, Take 1 tablet by mouth once daily, Disp: 90 tablet, Rfl: 0 .  erythromycin ophthalmic ointment, SMARTSIG:In Eye(s), Disp: , Rfl:  .  escitalopram (LEXAPRO) 10 MG tablet, Take 1 tablet by mouth once daily, Disp: 90 tablet, Rfl: 0 .  folic acid (  FOLVITE) 1 MG tablet, Take 1 tablet (1 mg total) by mouth daily., Disp: 90 tablet, Rfl: 1 .  ibuprofen (ADVIL,MOTRIN) 200 MG tablet, Take 400 mg by mouth every 8 (eight) hours as needed for headache or moderate pain. , Disp: , Rfl:  .  levothyroxine (SYNTHROID) 150 MCG tablet, Take 1 tablet (150 mcg total) by mouth daily before breakfast., Disp: 30 tablet, Rfl: 3 .  lidocaine-prilocaine (EMLA) cream, Apply to affected area once, Disp: 30 g, Rfl: 3 .  loperamide (IMODIUM) 2 MG capsule, Take 1 capsule (2 mg total) by mouth every 2 (two) hours as needed for diarrhea or loose stools. Do not exceed 16mg  daily., Disp: 30 capsule, Rfl: 1 .  Multiple Vitamins-Minerals (PRESERVISION AREDS 2) CAPS, Take 1 capsule by mouth 2 (two) times a day. , Disp: , Rfl:  .  polyethylene glycol (MIRALAX / GLYCOLAX) 17 g packet, Take 17 g by mouth daily as needed for mild constipation., Disp: 14 each, Rfl:  0 .  rosuvastatin (CRESTOR) 20 MG tablet, Take 1 tablet by mouth once daily, Disp: 90 tablet, Rfl: 0 No current facility-administered medications for this visit.  Facility-Administered Medications Ordered in Other Visits:  .  denosumab (XGEVA) injection 120 mg, 120 mg, Subcutaneous, Q30 days, Sindy Guadeloupe, MD, 120 mg at 03/05/19 1525 .  heparin lock flush 100 unit/mL, 500 Units, Intravenous, Once, Sindy Guadeloupe, MD .  sodium chloride flush (NS) 0.9 % injection 10 mL, 10 mL, Intravenous, PRN, Sindy Guadeloupe, MD, 10 mL at 01/28/20 0843  Physical exam:  Vitals:   01/28/20 0902  BP: 140/82  Pulse: 72  Resp: 18  Temp: 97.8 F (36.6 C)  TempSrc: Tympanic  SpO2: 97%  Weight: 177 lb 3.2 oz (80.4 kg)   Physical Exam Eyes:     Extraocular Movements: EOM normal.  Cardiovascular:     Rate and Rhythm: Normal rate and regular rhythm.     Heart sounds: Normal heart sounds.  Pulmonary:     Effort: Pulmonary effort is normal.     Breath sounds: Normal breath sounds.  Abdominal:     General: Bowel sounds are normal.     Palpations: Abdomen is soft.  Skin:    General: Skin is warm and dry.  Neurological:     Mental Status: She is alert and oriented to person, place, and time.      CMP Latest Ref Rng & Units 01/28/2020  Glucose 70 - 99 mg/dL 97  BUN 8 - 23 mg/dL 19  Creatinine 0.44 - 1.00 mg/dL 0.83  Sodium 135 - 145 mmol/L 138  Potassium 3.5 - 5.1 mmol/L 3.5  Chloride 98 - 111 mmol/L 99  CO2 22 - 32 mmol/L 29  Calcium 8.9 - 10.3 mg/dL 9.0  Total Protein 6.5 - 8.1 g/dL 7.1  Total Bilirubin 0.3 - 1.2 mg/dL 0.4  Alkaline Phos 38 - 126 U/L 67  AST 15 - 41 U/L 21  ALT 0 - 44 U/L 15   CBC Latest Ref Rng & Units 01/28/2020  WBC 4.0 - 10.5 K/uL 4.4  Hemoglobin 12.0 - 15.0 g/dL 11.8(L)  Hematocrit 36.0 - 46.0 % 37.3  Platelets 150 - 400 K/uL 143(L)      Assessment and plan- Patient is a 69 y.o. female with extensive stage small cell lung cancer and bone metastases.   She is  here for on treatment assessment prior to cycle 21 of maintenance Tecentriq  Counts okay to proceed with cycle 21 of maintenance  Tecentriq today.  I will see her back in 3 weeks with CBC with differential CMP and TSH for cycle 22.  Plan to repeat scans in February 2022.  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  Repeat levels due at next visit   Visit Diagnosis 1. Encounter for antineoplastic immunotherapy   2. Autoimmune hypothyroidism   3. Small cell lung cancer, right upper lobe ([place])      Dr. Randa Evens, MD, MPH [facility] [phone] 01/28/2020 11:53 AM

## 2020-01-31 ENCOUNTER — Other Ambulatory Visit: Payer: Self-pay | Admitting: Oncology

## 2020-02-18 ENCOUNTER — Encounter: Payer: Self-pay | Admitting: Oncology

## 2020-02-18 ENCOUNTER — Inpatient Hospital Stay: Payer: PPO

## 2020-02-18 ENCOUNTER — Inpatient Hospital Stay: Payer: PPO | Attending: Oncology

## 2020-02-18 ENCOUNTER — Inpatient Hospital Stay (HOSPITAL_BASED_OUTPATIENT_CLINIC_OR_DEPARTMENT_OTHER): Payer: PPO | Admitting: Oncology

## 2020-02-18 VITALS — BP 116/72 | HR 84 | Temp 97.3°F | Resp 18 | Wt 178.3 lb

## 2020-02-18 DIAGNOSIS — Z79899 Other long term (current) drug therapy: Secondary | ICD-10-CM | POA: Insufficient documentation

## 2020-02-18 DIAGNOSIS — E785 Hyperlipidemia, unspecified: Secondary | ICD-10-CM | POA: Insufficient documentation

## 2020-02-18 DIAGNOSIS — Z5112 Encounter for antineoplastic immunotherapy: Secondary | ICD-10-CM

## 2020-02-18 DIAGNOSIS — Z9049 Acquired absence of other specified parts of digestive tract: Secondary | ICD-10-CM | POA: Insufficient documentation

## 2020-02-18 DIAGNOSIS — Z923 Personal history of irradiation: Secondary | ICD-10-CM | POA: Insufficient documentation

## 2020-02-18 DIAGNOSIS — Z79890 Hormone replacement therapy (postmenopausal): Secondary | ICD-10-CM | POA: Insufficient documentation

## 2020-02-18 DIAGNOSIS — C3411 Malignant neoplasm of upper lobe, right bronchus or lung: Secondary | ICD-10-CM | POA: Diagnosis not present

## 2020-02-18 DIAGNOSIS — C7951 Secondary malignant neoplasm of bone: Secondary | ICD-10-CM | POA: Insufficient documentation

## 2020-02-18 DIAGNOSIS — E669 Obesity, unspecified: Secondary | ICD-10-CM | POA: Diagnosis not present

## 2020-02-18 DIAGNOSIS — Z87891 Personal history of nicotine dependence: Secondary | ICD-10-CM | POA: Diagnosis not present

## 2020-02-18 DIAGNOSIS — I10 Essential (primary) hypertension: Secondary | ICD-10-CM | POA: Diagnosis not present

## 2020-02-18 DIAGNOSIS — K7460 Unspecified cirrhosis of liver: Secondary | ICD-10-CM | POA: Insufficient documentation

## 2020-02-18 DIAGNOSIS — Z8041 Family history of malignant neoplasm of ovary: Secondary | ICD-10-CM | POA: Diagnosis not present

## 2020-02-18 DIAGNOSIS — E063 Autoimmune thyroiditis: Secondary | ICD-10-CM | POA: Diagnosis not present

## 2020-02-18 LAB — TSH: TSH: 0.139 u[IU]/mL — ABNORMAL LOW (ref 0.350–4.500)

## 2020-02-18 LAB — CBC WITH DIFFERENTIAL/PLATELET
Abs Immature Granulocytes: 0.01 10*3/uL (ref 0.00–0.07)
Basophils Absolute: 0.1 10*3/uL (ref 0.0–0.1)
Basophils Relative: 1 %
Eosinophils Absolute: 0 10*3/uL (ref 0.0–0.5)
Eosinophils Relative: 0 %
HCT: 36.5 % (ref 36.0–46.0)
Hemoglobin: 11.7 g/dL — ABNORMAL LOW (ref 12.0–15.0)
Immature Granulocytes: 0 %
Lymphocytes Relative: 10 %
Lymphs Abs: 0.5 10*3/uL — ABNORMAL LOW (ref 0.7–4.0)
MCH: 27.1 pg (ref 26.0–34.0)
MCHC: 32.1 g/dL (ref 30.0–36.0)
MCV: 84.7 fL (ref 80.0–100.0)
Monocytes Absolute: 0.4 10*3/uL (ref 0.1–1.0)
Monocytes Relative: 8 %
Neutro Abs: 4.1 10*3/uL (ref 1.7–7.7)
Neutrophils Relative %: 81 %
Platelets: 139 10*3/uL — ABNORMAL LOW (ref 150–400)
RBC: 4.31 MIL/uL (ref 3.87–5.11)
RDW: 15.9 % — ABNORMAL HIGH (ref 11.5–15.5)
WBC: 5.1 10*3/uL (ref 4.0–10.5)
nRBC: 0 % (ref 0.0–0.2)

## 2020-02-18 LAB — COMPREHENSIVE METABOLIC PANEL
ALT: 17 U/L (ref 0–44)
AST: 24 U/L (ref 15–41)
Albumin: 3.8 g/dL (ref 3.5–5.0)
Alkaline Phosphatase: 73 U/L (ref 38–126)
Anion gap: 10 (ref 5–15)
BUN: 16 mg/dL (ref 8–23)
CO2: 29 mmol/L (ref 22–32)
Calcium: 9.2 mg/dL (ref 8.9–10.3)
Chloride: 99 mmol/L (ref 98–111)
Creatinine, Ser: 0.95 mg/dL (ref 0.44–1.00)
GFR, Estimated: >60 mL/min (ref >60)
Glucose, Bld: 95 mg/dL (ref 70–99)
Potassium: 3.6 mmol/L (ref 3.5–5.1)
Sodium: 138 mmol/L (ref 135–145)
Total Bilirubin: 0.3 mg/dL (ref 0.3–1.2)
Total Protein: 7 g/dL (ref 6.5–8.1)

## 2020-02-18 MED ORDER — ACETAMINOPHEN 325 MG PO TABS
650.0000 mg | ORAL_TABLET | Freq: Once | ORAL | Status: AC
  Administered 2020-02-18: 650 mg via ORAL

## 2020-02-18 MED ORDER — ACETAMINOPHEN 325 MG PO TABS
ORAL_TABLET | ORAL | Status: AC
  Filled 2020-02-18: qty 2

## 2020-02-18 MED ORDER — SODIUM CHLORIDE 0.9 % IV SOLN
Freq: Once | INTRAVENOUS | Status: AC
  Filled 2020-02-18: qty 250

## 2020-02-18 MED ORDER — SODIUM CHLORIDE 0.9 % IV SOLN
1200.0000 mg | Freq: Once | INTRAVENOUS | Status: AC
  Administered 2020-02-18: 1200 mg via INTRAVENOUS
  Filled 2020-02-18: qty 20

## 2020-02-18 MED ORDER — HEPARIN SOD (PORK) LOCK FLUSH 100 UNIT/ML IV SOLN
500.0000 [IU] | Freq: Once | INTRAVENOUS | Status: AC | PRN
  Administered 2020-02-18: 500 [IU]
  Filled 2020-02-18: qty 5

## 2020-02-18 NOTE — Progress Notes (Signed)
1030: Pt reports that she fell out of bed head first last night. she is currently having a headache.  Per DR. Janese Banks pt to "watch symptoms for next 24 hours. call us if symptoms worse by tomorrow".  Okay to give pt 650 MG PO tylenol.  Pt aware and verbalizes understanding and agrees with plan.   1040: Pt tolerated infusion well. Pt stable at discharge.

## 2020-02-18 NOTE — Progress Notes (Signed)
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 She has been referred to [place] for findings of bone metastases and her recent MRI. She has a prior history of 3 packs/day day smoking for over 45 years and quit smoking 5 years ago.She had a CT chest abdomen pelvis in 2018 which showed a 5 mm lung nodule in the left lower lobe. Recently over the last 2 months patient has been having worsening back pain and was referred to orthopedics. 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 Pathologic fracture of S1.  PET scan showed 2 RUL lung nodules, hilar and mediastinal adenopathy and widespread bone mets. MRI brain negative.  Patientcompletedpalliative RT to her spine. Bronchoscopy showed small cell lung cancer. Palliative carboplatin, etoposide and Tecentriq started on 08/18/2018. Scans after 4 cycles showed stable disease. She is on maintenance tecentriq   Interval history-patient is doing well and denies any complaints at this time.  Appetite and weight have remained stable.  Denies any nausea vomiting or diarrhea.  Denies any  back pain or shortness of breath  ECOG PS- 1 Pain scale- 0 Opioid associated constipation-no  Review of systems- Review of Systems  Constitutional: Negative for chills, fever, malaise/fatigue and weight loss.  HENT: Negative for congestion, ear discharge and nosebleeds.   Eyes: Negative for blurred vision.  Respiratory: Negative for cough, hemoptysis, sputum production, shortness of breath and wheezing.   Cardiovascular: Negative for chest pain, palpitations, orthopnea and claudication.  Gastrointestinal: Negative for abdominal pain, blood in stool, constipation, diarrhea, heartburn, melena, nausea and vomiting.  Genitourinary: Negative for dysuria, flank pain, frequency, hematuria and urgency.  Musculoskeletal: Negative for back pain, joint pain and myalgias.  Skin: Negative for rash.  Neurological: Negative for dizziness, tingling, focal weakness, seizures, weakness and headaches.  Endo/Heme/Allergies: Does not bruise/bleed easily.  Psychiatric/Behavioral: Negative for depression and suicidal ideas. The patient does not have insomnia.       Allergies  Allergen Reactions  . Bee Venom Swelling     Past Medical History:  Diagnosis Date  . Arthritis   . Cirrhosis ([place])   . Depression   . GERD (gastroesophageal reflux disease)   . Hyperlipidemia   . Hypertension   . Lung cancer ([place])   . Metastatic bone cancer [facility])      Past Surgical History:  Procedure Laterality Date  . APPENDECTOMY  1971  . CHOLECYSTECTOMY  1971  . COLONOSCOPY WITH PROPOFOL N/A 11/15/2016   Procedure: COLONOSCOPY WITH PROPOFOL;  Surgeon: Jonathon Bellows, MD;  Location: [facility] ENDOSCOPY;  Service: Gastroenterology;  Laterality: N/A;  . ENDOBRONCHIAL ULTRASOUND Right 07/30/2018   Procedure: ENDOBRONCHIAL ULTRASOUND;  Surgeon: Laverle Hobby, MD;  Location: [facility] ORS;  Service: Pulmonary;  Laterality: Right;  .  ESOPHAGOGASTRODUODENOSCOPY (EGD) WITH PROPOFOL N/A 01/07/2017   Procedure:  ESOPHAGOGASTRODUODENOSCOPY (EGD) WITH PROPOFOL;  Surgeon: Jonathon Bellows, MD;  Location: [facility] ENDOSCOPY;  Service: Gastroenterology;  Laterality: N/A;  . LAPAROSCOPY N/A 03/01/2017   Procedure: LAPAROSCOPY DIAGNOSTIC;  Surgeon: Robert Bellow, MD;  Location: [facility] ORS;  Service: General;  Laterality: N/A;  . PORTA CATH INSERTION N/A 08/14/2018   Procedure: PORTA CATH INSERTION;  Surgeon: Algernon Huxley, MD;  Location: [place] CV LAB;  Service: Cardiovascular;  Laterality: N/A;  . TONSILECTOMY, ADENOIDECTOMY, BILATERAL MYRINGOTOMY [place]  . TONSILLECTOMY    . VENTRAL HERNIA REPAIR N/A 03/01/2017   10 x 14 CM Ventralight ST mesh, intraperitoneal location.   . VENTRAL HERNIA REPAIR N/A 03/01/2017   Procedure: HERNIA REPAIR VENTRAL ADULT;  Surgeon: Robert Bellow, MD;  Location: [facility] ORS;  Service: General;  Laterality: N/A;    Social History   Socioeconomic History  . Marital status: Married    Spouse name: [name]  . Number of children: Not on file  . Years of education: Not on file  . Highest education level: Not on file  Occupational History  . Not on file  Tobacco Use  . Smoking status: Former Smoker    Packs/day: 2.00    Years: 50.00    Pack years: 100.00    Types: Cigarettes, E-cigarettes    Quit date: 02/07/2012    Years since quitting: 8.0  . Smokeless tobacco: Never Used  Vaping Use  . Vaping Use: Some days  . Start date: 10/06/2013  . Devices: uses no liquid  Substance and Sexual Activity  . Alcohol use: No    Alcohol/week: 0.0 standard drinks  . Drug use: No  . Sexual activity: Yes  Other Topics Concern  . Not on file  Social History Narrative   Married   Retired   Clinical cytogeneticist level of education    No children    1 cup of coffee   Social Determinants of Radio broadcast assistant Strain: Low Risk   . Difficulty of Paying Living Expenses: Not hard at all  Food Insecurity: No Food Insecurity  . Worried About Charity fundraiser in the Last  Year: Never true  . Ran Out of Food in the Last Year: Never true  Transportation Needs: No Transportation Needs  . Lack of Transportation (Medical): No  . Lack of Transportation (Non-Medical): No  Physical Activity: Not on file  Stress: No Stress Concern Present  . Feeling of Stress : Not at all  Social Connections: Not on file  Intimate Partner Violence: Not At Risk  . Fear of Current or Ex-Partner: No  . Emotionally Abused: No  . Physically Abused: No  . Sexually Abused: No    Family History  Problem Relation Age of Onset  . Hypertension Mother   . Ovarian cancer Mother 64  . Heart disease Father   . Stroke Father   . Ovarian cancer Sister        ? dx cancer had hyst.  . Breast cancer Neg Hx      Current Outpatient Medications:  .  folic acid (FOLVITE) 1 MG tablet, Take 1 tablet by mouth once daily, Disp: 90 tablet, Rfl: 0 .  amLODipine (NORVASC) 5 MG tablet, Take 1 tablet (5 mg total) by mouth daily., Disp: 90 tablet, Rfl: 3 .  buPROPion (WELLBUTRIN XL) 300 MG 24 hr tablet, Take 1 tablet by mouth once daily, Disp: 90 tablet, Rfl: 0 .  erythromycin ophthalmic ointment, SMARTSIG:In Eye(s), Disp: , Rfl:  .  escitalopram (LEXAPRO) 10 MG tablet, Take 1 tablet by mouth once daily, Disp: 90 tablet, Rfl: 0 .  ibuprofen (ADVIL,MOTRIN) 200 MG tablet, Take 400 mg by mouth every 8 (eight) hours as needed for headache or moderate pain. , Disp: , Rfl:  .  levothyroxine (SYNTHROID) 150 MCG tablet, Take 1 tablet (150 mcg total) by mouth daily before breakfast., Disp: 30 tablet, Rfl: 3 .  lidocaine-prilocaine (EMLA) cream, Apply to affected area once, Disp: 30 g, Rfl: 3 .  loperamide (IMODIUM) 2 MG capsule, Take 1 capsule (2 mg total) by mouth every 2 (two) hours as needed for diarrhea or loose stools. Do not exceed 16mg  daily., Disp: 30 capsule, Rfl: 1 .  Multiple Vitamins-Minerals (PRESERVISION AREDS 2) CAPS, Take 1 capsule by mouth 2 (two) times a day. , Disp: , Rfl:  .  polyethylene  glycol (MIRALAX / GLYCOLAX) 17 g packet, Take 17 g by mouth daily as needed for mild constipation., Disp: 14 each, Rfl: 0 .  rosuvastatin (CRESTOR) 20 MG tablet, Take 1 tablet by mouth once daily, Disp: 90 tablet, Rfl: 0 No current facility-administered medications for this visit.  Facility-Administered Medications Ordered in Other Visits:  .  denosumab (XGEVA) injection 120 mg, 120 mg, Subcutaneous, Q30 days, Sindy Guadeloupe, MD, 120 mg at 03/05/19 1525  Physical exam:  Vitals:   02/18/20 0847  BP: 116/72  Pulse: 84  Resp: 18  Temp: (!) 97.3 F (36.3 C)  TempSrc: Tympanic  SpO2: 92%  Weight: 178 lb 4.8 oz (80.9 kg)   Physical Exam Eyes:     Extraocular Movements: EOM normal.  Cardiovascular:     Rate and Rhythm: Normal rate and regular rhythm.     Heart sounds: Normal heart sounds.  Pulmonary:     Effort: Pulmonary effort is normal.     Breath sounds: Normal breath sounds.  Skin:    General: Skin is warm and dry.  Neurological:     Mental Status: She is alert and oriented to person, place, and time.      CMP Latest Ref Rng & Units 02/18/2020  Glucose 70 - 99 mg/dL 95  BUN 8 - 23 mg/dL 16  Creatinine 0.44 - 1.00 mg/dL 0.95  Sodium 135 - 145 mmol/L 138  Potassium 3.5 - 5.1 mmol/L 3.6  Chloride 98 - 111 mmol/L 99  CO2 22 - 32 mmol/L 29  Calcium 8.9 - 10.3 mg/dL 9.2  Total Protein 6.5 - 8.1 g/dL 7.0  Total Bilirubin 0.3 - 1.2 mg/dL 0.3  Alkaline Phos 38 - 126 U/L 73  AST 15 - 41 U/L 24  ALT 0 - 44 U/L 17   CBC Latest Ref Rng & Units 02/18/2020  WBC 4.0 - 10.5 K/uL 5.1  Hemoglobin 12.0 - 15.0 g/dL 11.7(L)  Hematocrit 36.0 - 46.0 % 36.5  Platelets 150 - 400 K/uL 139(L)      Assessment and plan- Patient is a 70 y.o. female with extensive stage small cell lung cancer and bone metastases. She is here for on treatment assessment prior to cycle 22 of maintenance Tecentriq  Counts okay to proceed with cycle 22 of maintenance Tecentriq today.  I will see her back in 3  weeks for cycle 23.  Plan to repeat scans next month.  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  TSH from today is pending  Visit Diagnosis 1. Small cell lung cancer, right upper lobe ([place])  2. Encounter for antineoplastic immunotherapy      Dr. Randa Evens, MD, MPH [facility] [phone] 02/18/2020 9:09 AM

## 2020-02-18 NOTE — Addendum Note (Signed)
Addended by: Kern Alberta on: 02/18/2020 09:32 AM   Modules accepted: Orders

## 2020-02-19 ENCOUNTER — Encounter: Payer: Self-pay | Admitting: Family Medicine

## 2020-02-19 ENCOUNTER — Telehealth: Payer: Self-pay | Admitting: Family Medicine

## 2020-02-19 ENCOUNTER — Ambulatory Visit
Admission: EM | Admit: 2020-02-19 | Discharge: 2020-02-19 | Disposition: A | Discharge status: Home / Self Care | Payer: PPO | Attending: Family Medicine | Admitting: Family Medicine

## 2020-02-19 DIAGNOSIS — R110 Nausea: Secondary | ICD-10-CM

## 2020-02-19 MED ORDER — ONDANSETRON 4 MG PO TBDP: 4.0000 mg | ORAL_TABLET | Freq: Three times a day (TID) | ORAL | 0 refills | Status: DC | PRN

## 2020-02-19 NOTE — ED Triage Notes (Signed)
Pt presents with c/o nausea and head ache that began this morning, pt denies abdominal pain or sob , o2 sat 89%

## 2020-02-19 NOTE — ED Provider Notes (Signed)
Anne [name]    CSN: [id number] Arrival date & time: 02/19/20  5284      History   Chief Complaint Chief Complaint  Patient presents with  . Nausea  . Headache    HPI Anne [name] is a 70 y.o. female.   Patient is a 70 year old female with past medical history of arthritis, cirrhosis, depression, GERD, hyperlipidemia, hypertension, lung cancer, metastatic bone cancer.  She presents today with complaints of mild headache, nausea.  This started when she woke up this morning.  The headache has somewhat decreased.  Has not take any medication for her symptoms.  Did receive treatment at the cancer center yesterday.  Denies any other symptoms to include nasal congestion, rhinorrhea, sore throat, ear pain, dizziness, chest pain, shortness of breath, cough, fevers, abdominal pain, nausea, vomiting, diarrhea, urinary issues.     Past Medical History:  Diagnosis Date  . Arthritis   . Cirrhosis ([place])   . Depression   . GERD (gastroesophageal reflux disease)   . Hyperlipidemia   . Hypertension   . Lung cancer ([place])   . Metastatic bone cancer [facility])     Patient Active Problem List   Diagnosis Date Noted  . Left shoulder pain 07/22/2019  . Hypothyroidism 02/16/2019  . Goals of care, counseling/discussion 08/06/2018  . Small cell lung cancer, right upper lobe ([place]) 08/06/2018  . Bone metastases ([place]) 08/06/2018  . Bleeding nose 02/26/2018  . Vision changes 02/26/2018  . Skin tear of forearm without complication, initial encounter 12/06/2017  . Bruising 12/06/2017  . Obesity (BMI 30.0-34.9) 08/05/2017  . Abdominal wall hernia 02/15/2017  . Lung nodule < 6cm on CT 02/15/2017  . Anemia 01/23/2017  . Cirrhosis of liver ([place]) 01/23/2017  . Gastritis 01/23/2017  . Chest pain 01/03/2017  . Hypokalemia 01/03/2017  . Weight loss 12/13/2016  . Callus of foot 07/27/2016  . Heart murmur 07/27/2016  . Right calf pain 04/20/2016  . Chronic low back pain 08/15/2015  . Benign  essential HTN 02/22/2015  . HLD (hyperlipidemia) 02/22/2015  . Depression 02/22/2015    Past Surgical History:  Procedure Laterality Date  . APPENDECTOMY  1971  . CHOLECYSTECTOMY  1971  . COLONOSCOPY WITH PROPOFOL N/A 11/15/2016   Procedure: COLONOSCOPY WITH PROPOFOL;  Surgeon: Jonathon Bellows, MD;  Location: [facility] ENDOSCOPY;  Service: Gastroenterology;  Laterality: N/A;  . ENDOBRONCHIAL ULTRASOUND Right 07/30/2018   Procedure: ENDOBRONCHIAL ULTRASOUND;  Surgeon: Laverle Hobby, MD;  Location: [facility] ORS;  Service: Pulmonary;  Laterality: Right;  . ESOPHAGOGASTRODUODENOSCOPY (EGD) WITH PROPOFOL N/A 01/07/2017   Procedure: ESOPHAGOGASTRODUODENOSCOPY (EGD) WITH PROPOFOL;  Surgeon: Jonathon Bellows, MD;  Location: [facility] ENDOSCOPY;  Service: Gastroenterology;  Laterality: N/A;  . LAPAROSCOPY N/A 03/01/2017   Procedure: LAPAROSCOPY DIAGNOSTIC;  Surgeon: Robert Bellow, MD;  Location: [facility] ORS;  Service: General;  Laterality: N/A;  . PORTA CATH INSERTION N/A 08/14/2018   Procedure: PORTA CATH INSERTION;  Surgeon: Algernon Huxley, MD;  Location: [place] CV LAB;  Service: Cardiovascular;  Laterality: N/A;  . TONSILECTOMY, ADENOIDECTOMY, BILATERAL MYRINGOTOMY [place]  . TONSILLECTOMY    . VENTRAL HERNIA REPAIR N/A 03/01/2017   10 x 14 CM Ventralight ST mesh, intraperitoneal location.   . VENTRAL HERNIA REPAIR N/A 03/01/2017   Procedure: HERNIA REPAIR VENTRAL ADULT;  Surgeon: Robert Bellow, MD;  Location: [facility] ORS;  Service: General;  Laterality: N/A;    OB History    Gravida  3   Para  2   Term  0   Preterm      AB  1   Living  2     SAB  1   IAB      Ectopic  0   Multiple      Live Births               Home Medications    Prior to Admission medications   Medication Sig Start Date End Date Taking? Authorizing Provider  ondansetron (ZOFRAN ODT) 4 MG disintegrating tablet Take 1 tablet (4 mg total) by mouth every 8 (eight) hours as needed for nausea or  vomiting. 02/19/20  Yes Jori Frerichs A, NP  amLODipine (NORVASC) 5 MG tablet Take 1 tablet (5 mg total) by mouth daily. 01/26/20   Leone Haven, MD  buPROPion (WELLBUTRIN XL) 300 MG 24 hr tablet Take 1 tablet by mouth once daily 01/22/20   Leone Haven, MD  erythromycin ophthalmic ointment SMARTSIG:In Eye(s) 01/11/20   [provider]  escitalopram (LEXAPRO) 10 MG tablet Take 1 tablet by mouth once daily 01/13/20   Leone Haven, MD  folic acid (FOLVITE) 1 MG tablet Take 1 tablet by mouth once daily 02/01/20   Sindy Guadeloupe, MD  ibuprofen (ADVIL,MOTRIN) 200 MG tablet Take 400 mg by mouth every 8 (eight) hours as needed for headache or moderate pain.     [provider]  levothyroxine (SYNTHROID) 150 MCG tablet Take 1 tablet (150 mcg total) by mouth daily before breakfast. 08/21/19   Sindy Guadeloupe, MD  lidocaine-prilocaine (EMLA) cream Apply to affected area once 08/06/18   Sindy Guadeloupe, MD  loperamide (IMODIUM) 2 MG capsule Take 1 capsule (2 mg total) by mouth every 2 (two) hours as needed for diarrhea or loose stools. Do not exceed 16mg  daily. 08/28/18   Sindy Guadeloupe, MD  Multiple Vitamins-Minerals (PRESERVISION AREDS 2) CAPS Take 1 capsule by mouth 2 (two) times a day.     [provider]  polyethylene glycol (MIRALAX / GLYCOLAX) 17 g packet Take 17 g by mouth daily as needed for mild constipation. 11/03/18   Epifanio Lesches, MD  rosuvastatin (CRESTOR) 20 MG tablet Take 1 tablet by mouth once daily 01/13/20   Leone Haven, MD    Family History Family History  Problem Relation Age of Onset  . Hypertension Mother   . Ovarian cancer Mother 45  . Heart disease Father   . Stroke Father   . Ovarian cancer Sister        ? dx cancer had hyst.  . Breast cancer Neg Hx     Social History Social History   Tobacco Use  . Smoking status: Former Smoker    Packs/day: 2.00    Years: 50.00    Pack years: 100.00    Types: Cigarettes, E-cigarettes     Quit date: 02/07/2012    Years since quitting: 8.0  . Smokeless tobacco: Never Used  Vaping Use  . Vaping Use: Some days  . Start date: 10/06/2013  . Devices: uses no liquid  Substance Use Topics  . Alcohol use: No    Alcohol/week: 0.0 standard drinks  . Drug use: No     Allergies   Bee venom   Review of Systems Review of Systems   Physical Exam Triage Vital Signs ED Triage Vitals  Enc Vitals Group     BP 02/19/20 1048 (!) 155/81     Pulse Rate 02/19/20 1048 97     Resp  02/19/20 1048 18     Temp 02/19/20 1048 98.3 F (36.8 C)     Temp src --      SpO2 02/19/20 1048 (!) 89 %     Weight --      Height --      Head Circumference --      Peak Flow --      Pain Score 02/19/20 1046 5     Pain Loc --      Pain Edu? --      Excl. in [name]? --    No data found.  Updated Vital Signs BP (!) 155/81   Pulse 97   Temp 98.3 F (36.8 C)   Resp 18   SpO2 (!) 89%   Visual Acuity Right Eye Distance:   Left Eye Distance:   Bilateral Distance:    Right Eye Near:   Left Eye Near:    Bilateral Near:     Physical Exam Vitals and nursing note reviewed.  Constitutional:      General: She is not in acute distress.    Appearance: Normal appearance. She is not ill-appearing, toxic-appearing or diaphoretic.  HENT:     Head: Normocephalic.     Nose: Nose normal.  Eyes:     Conjunctiva/sclera: Conjunctivae normal.  Pulmonary:     Effort: Pulmonary effort is normal.  Musculoskeletal:        General: Normal range of motion.     Cervical back: Normal range of motion.  Skin:    General: Skin is warm and dry.     Findings: No rash.  Neurological:     Mental Status: She is alert.  Psychiatric:        Mood and Affect: Mood normal.      UC Treatments / Results  Labs (all labs ordered are listed, but only abnormal results are displayed) Labs Reviewed - No data to display  EKG   Radiology No results found.  Procedures Procedures (including critical care  time)  Medications Ordered in UC Medications - No data to display  Initial Impression / Assessment and Plan / UC Course  I have reviewed the triage vital signs and the nursing notes.  Pertinent labs & imaging results that were available during my care of the patient were reviewed by me and considered in my medical decision making (see chart for details).     Nausea without vomiting and mild headache. Most likely something viral.  Patient having no other concerning signs or symptoms today. No red flags. Sats low today but did get to 92% which is her norm.  not a very good reading on the pulse ox machine. Not a good wave form. Hands cold. She is not having any shortness of breath, dyspnea or distress.  No cough. COVID test pending Zofran prescribed for nausea, vomiting as needed.  Recommended rest, Tylenol for headache as needed If symptoms do not improve or worsen she will need to go to the ER  Final Clinical Impressions(s) / UC Diagnoses   Final diagnoses:  Nausea without vomiting     Discharge Instructions     COVID test pending.  You can take Zofran for nausea as needed.  Rest, Tylenol for headache as needed Follow up as needed for continued or worsening symptoms     ED Prescriptions    Medication Sig Dispense Auth. Provider   ondansetron (ZOFRAN ODT) 4 MG disintegrating tablet Take 1 tablet (4 mg total) by mouth every 8 (eight) hours  as needed for nausea or vomiting. 20 tablet Loura Halt A, NP     PDMP not reviewed this encounter.   Orvan July, NP 02/19/20 1220

## 2020-02-19 NOTE — Telephone Encounter (Signed)
Pt called she is having nausea, vomiting and other GI issues  No appts available

## 2020-02-19 NOTE — Telephone Encounter (Signed)
I called and spoke with the patient and she stated she has not vomited just has nausea. I looked to see if there were any appointments available for other providers and there was none, I inforemed the patient to go to the urgent care across the street to be evaluated and she agreed that she would.  Anne [name],cma

## 2020-02-19 NOTE — Discharge Instructions (Addendum)
COVID test pending.  You can take Zofran for nausea as needed.  Rest, Tylenol for headache as needed Follow up as needed for continued or worsening symptoms

## 2020-02-29 DIAGNOSIS — H353211 Exudative age-related macular degeneration, right eye, with active choroidal neovascularization: Secondary | ICD-10-CM | POA: Diagnosis not present

## 2020-03-01 ENCOUNTER — Ambulatory Visit: Payer: PPO | Admitting: Family Medicine

## 2020-03-03 ENCOUNTER — Ambulatory Visit: Payer: PPO | Admitting: Family Medicine

## 2020-03-10 ENCOUNTER — Encounter: Payer: Self-pay | Admitting: Oncology

## 2020-03-10 ENCOUNTER — Inpatient Hospital Stay: Payer: PPO | Attending: Oncology

## 2020-03-10 ENCOUNTER — Inpatient Hospital Stay (HOSPITAL_BASED_OUTPATIENT_CLINIC_OR_DEPARTMENT_OTHER): Payer: PPO | Admitting: Oncology

## 2020-03-10 ENCOUNTER — Inpatient Hospital Stay: Payer: PPO

## 2020-03-10 ENCOUNTER — Other Ambulatory Visit: Payer: Self-pay | Admitting: *Deleted

## 2020-03-10 VITALS — BP 122/82 | HR 76 | Temp 98.0°F | Resp 16 | Ht 65.0 in | Wt 172.0 lb

## 2020-03-10 DIAGNOSIS — C3411 Malignant neoplasm of upper lobe, right bronchus or lung: Secondary | ICD-10-CM

## 2020-03-10 DIAGNOSIS — Z79899 Other long term (current) drug therapy: Secondary | ICD-10-CM | POA: Diagnosis not present

## 2020-03-10 DIAGNOSIS — C7951 Secondary malignant neoplasm of bone: Secondary | ICD-10-CM | POA: Insufficient documentation

## 2020-03-10 DIAGNOSIS — Z87891 Personal history of nicotine dependence: Secondary | ICD-10-CM | POA: Insufficient documentation

## 2020-03-10 DIAGNOSIS — Z79890 Hormone replacement therapy (postmenopausal): Secondary | ICD-10-CM | POA: Diagnosis not present

## 2020-03-10 DIAGNOSIS — Z8041 Family history of malignant neoplasm of ovary: Secondary | ICD-10-CM | POA: Diagnosis not present

## 2020-03-10 DIAGNOSIS — E785 Hyperlipidemia, unspecified: Secondary | ICD-10-CM | POA: Insufficient documentation

## 2020-03-10 DIAGNOSIS — Z923 Personal history of irradiation: Secondary | ICD-10-CM | POA: Insufficient documentation

## 2020-03-10 DIAGNOSIS — E669 Obesity, unspecified: Secondary | ICD-10-CM | POA: Insufficient documentation

## 2020-03-10 DIAGNOSIS — K7460 Unspecified cirrhosis of liver: Secondary | ICD-10-CM | POA: Insufficient documentation

## 2020-03-10 DIAGNOSIS — Z5112 Encounter for antineoplastic immunotherapy: Secondary | ICD-10-CM

## 2020-03-10 DIAGNOSIS — I10 Essential (primary) hypertension: Secondary | ICD-10-CM | POA: Insufficient documentation

## 2020-03-10 DIAGNOSIS — Z9049 Acquired absence of other specified parts of digestive tract: Secondary | ICD-10-CM | POA: Insufficient documentation

## 2020-03-10 DIAGNOSIS — R7989 Other specified abnormal findings of blood chemistry: Secondary | ICD-10-CM

## 2020-03-10 LAB — COMPREHENSIVE METABOLIC PANEL
ALT: 19 U/L (ref 0–44)
AST: 25 U/L (ref 15–41)
Albumin: 3.5 g/dL (ref 3.5–5.0)
Alkaline Phosphatase: 77 U/L (ref 38–126)
Anion gap: 9 (ref 5–15)
BUN: 14 mg/dL (ref 8–23)
CO2: 28 mmol/L (ref 22–32)
Calcium: 8.7 mg/dL — ABNORMAL LOW (ref 8.9–10.3)
Chloride: 103 mmol/L (ref 98–111)
Creatinine, Ser: 0.76 mg/dL (ref 0.44–1.00)
GFR, Estimated: >60 mL/min (ref >60)
Glucose, Bld: 95 mg/dL (ref 70–99)
Potassium: 3.7 mmol/L (ref 3.5–5.1)
Sodium: 140 mmol/L (ref 135–145)
Total Bilirubin: 0.5 mg/dL (ref 0.3–1.2)
Total Protein: 6.9 g/dL (ref 6.5–8.1)

## 2020-03-10 LAB — CBC WITH DIFFERENTIAL/PLATELET
Abs Immature Granulocytes: 0.02 10*3/uL (ref 0.00–0.07)
Basophils Absolute: 0 10*3/uL (ref 0.0–0.1)
Basophils Relative: 1 %
Eosinophils Absolute: 0 10*3/uL (ref 0.0–0.5)
Eosinophils Relative: 0 %
HCT: 35.9 % — ABNORMAL LOW (ref 36.0–46.0)
Hemoglobin: 11.3 g/dL — ABNORMAL LOW (ref 12.0–15.0)
Immature Granulocytes: 0 %
Lymphocytes Relative: 15 %
Lymphs Abs: 0.8 10*3/uL (ref 0.7–4.0)
MCH: 26.6 pg (ref 26.0–34.0)
MCHC: 31.5 g/dL (ref 30.0–36.0)
MCV: 84.5 fL (ref 80.0–100.0)
Monocytes Absolute: 0.4 10*3/uL (ref 0.1–1.0)
Monocytes Relative: 8 %
Neutro Abs: 4 10*3/uL (ref 1.7–7.7)
Neutrophils Relative %: 76 %
Platelets: 239 10*3/uL (ref 150–400)
RBC: 4.25 MIL/uL (ref 3.87–5.11)
RDW: 16.5 % — ABNORMAL HIGH (ref 11.5–15.5)
WBC: 5.3 10*3/uL (ref 4.0–10.5)
nRBC: 0 % (ref 0.0–0.2)

## 2020-03-10 MED ORDER — HEPARIN SOD (PORK) LOCK FLUSH 100 UNIT/ML IV SOLN
500.0000 [IU] | Freq: Once | INTRAVENOUS | Status: AC
  Administered 2020-03-10: 500 [IU] via INTRAVENOUS
  Filled 2020-03-10: qty 5

## 2020-03-10 MED ORDER — HEPARIN SOD (PORK) LOCK FLUSH 100 UNIT/ML IV SOLN
INTRAVENOUS | Status: AC
  Filled 2020-03-10: qty 5

## 2020-03-10 MED ORDER — SODIUM CHLORIDE 0.9 % IV SOLN
Freq: Once | INTRAVENOUS | Status: AC
  Filled 2020-03-10: qty 250

## 2020-03-10 MED ORDER — SODIUM CHLORIDE 0.9 % IV SOLN
1200.0000 mg | Freq: Once | INTRAVENOUS | Status: AC
  Administered 2020-03-10: 1200 mg via INTRAVENOUS
  Filled 2020-03-10: qty 20

## 2020-03-10 MED ORDER — SODIUM CHLORIDE 0.9% FLUSH
10.0000 mL | INTRAVENOUS | Status: DC | PRN
  Administered 2020-03-10: 10 mL via INTRAVENOUS
  Filled 2020-03-10: qty 10

## 2020-03-10 NOTE — Progress Notes (Signed)
Pt doing good, no concerns per pt today

## 2020-03-10 NOTE — Progress Notes (Signed)
Patient tolerated treatment well. Discharged home.

## 2020-03-10 NOTE — Progress Notes (Signed)
Hematology/Oncology Consult note [facility]  [phone] [phone]  Patient Care Team: Leone Haven, MD as PCP - General (Family Medicine) Leone Haven, MD as Consulting Physician (Family Medicine) Bary Castilla, Forest Gleason, MD (General Surgery) Telford Nab, RN as Registered Nurse   Name of the patient: Anne [name]  [id number]  04-Nov-1950   Date of visit: 03/10/20  Diagnosis- extensive stage small cell lung cancer with bone metastases  Chief complaint/ Reason for visit-on treatment assessment prior to next cycle of maintenance Tecentriq  Heme/Onc history: patient is a 70 year old female with a past medical history significant for hypertension hyperlipidemia obesity and cirrhosis of the liver among other medical problems. She has been referred to [place] for findings of bone metastases and her recent MRI. She has a prior history of 3 packs/day day smoking for over 45 years and quit smoking 5 years ago.She had a CT chest abdomen pelvis in 2018 which showed a 5 mm lung nodule in the left lower lobe. Recently over the last 2 months patient has been having worsening back pain and was referred to orthopedics. 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 Pathologic fracture of S1.  PET scan showed 2 RUL lung nodules, hilar and mediastinal adenopathy and widespread bone mets. MRI brain negative.  Patientcompletedpalliative RT to her spine. Bronchoscopy showed small cell lung cancer. Palliative carboplatin, etoposide and Tecentriq started on 08/18/2018. Scans after 4 cycles showed stable disease. She is on maintenance tecentriq  Interval history-patient is doing well and denies any complaints at this time.  Appetite and weight have remained stable.  Denies any new aches and pains anywhere  ECOG PS-  1 Pain scale- 0 Opioid associated constipation- no  Review of systems- Review of Systems  Constitutional: Negative for chills, fever, malaise/fatigue and weight loss.  HENT: Negative for congestion, ear discharge and nosebleeds.   Eyes: Negative for blurred vision.  Respiratory: Negative for cough, hemoptysis, sputum production, shortness of breath and wheezing.   Cardiovascular: Negative for chest pain, palpitations, orthopnea and claudication.  Gastrointestinal: Negative for abdominal pain, blood in stool, constipation, diarrhea, heartburn, melena, nausea and vomiting.  Genitourinary: Negative for dysuria, flank pain, frequency, hematuria and urgency.  Musculoskeletal: Negative for back pain, joint pain and myalgias.  Skin: Negative for rash.  Neurological: Negative for dizziness, tingling, focal weakness, seizures, weakness and headaches.  Endo/Heme/Allergies: Does not bruise/bleed easily.  Psychiatric/Behavioral: Negative for depression and suicidal ideas. The patient does not have insomnia.       Allergies  Allergen Reactions  . Bee Venom Swelling     Past Medical History:  Diagnosis Date  . Arthritis   . Cirrhosis ([place])   . Depression   . GERD (gastroesophageal reflux disease)   . Hyperlipidemia   . Hypertension   . Lung cancer ([place])   . Metastatic bone cancer [facility])      Past Surgical History:  Procedure Laterality Date  . APPENDECTOMY  1971  . CHOLECYSTECTOMY  1971  . COLONOSCOPY WITH PROPOFOL N/A 11/15/2016   Procedure: COLONOSCOPY WITH PROPOFOL;  Surgeon: Jonathon Bellows, MD;  Location[facility] ENDOSCOPY;  Service: Gastroenterology;  Laterality: N/A;  . ENDOBRONCHIAL ULTRASOUND Right 07/30/2018   Procedure: ENDOBRONCHIAL ULTRASOUND;  Surgeon: Laverle Hobby, MD;  Location: [facility] ORS;  Service: Pulmonary;  Laterality: Right;  . ESOPHAGOGASTRODUODENOSCOPY (EGD) WITH PROPOFOL N/A 01/07/2017  Procedure: ESOPHAGOGASTRODUODENOSCOPY (EGD) WITH PROPOFOL;  Surgeon: Jonathon Bellows, MD;  Location: [facility] ENDOSCOPY;  Service: Gastroenterology;  Laterality: N/A;  . LAPAROSCOPY N/A 03/01/2017   Procedure: LAPAROSCOPY DIAGNOSTIC;  Surgeon: Robert Bellow, MD;  Location: [facility] ORS;  Service: General;  Laterality: N/A;  . PORTA CATH INSERTION N/A 08/14/2018   Procedure: PORTA CATH INSERTION;  Surgeon: Algernon Huxley, MD;  Location: [place] CV LAB;  Service: Cardiovascular;  Laterality: N/A;  . TONSILECTOMY, ADENOIDECTOMY, BILATERAL MYRINGOTOMY [place]  . TONSILLECTOMY    . VENTRAL HERNIA REPAIR N/A 03/01/2017   10 x 14 CM Ventralight ST mesh, intraperitoneal location.   . VENTRAL HERNIA REPAIR N/A 03/01/2017   Procedure: HERNIA REPAIR VENTRAL ADULT;  Surgeon: Robert Bellow, MD;  Location: [facility] ORS;  Service: General;  Laterality: N/A;    Social History   Socioeconomic History  . Marital status: Married    Spouse name: [name]  . Number of children: Not on file  . Years of education: Not on file  . Highest education level: Not on file  Occupational History  . Not on file  Tobacco Use  . Smoking status: Former Smoker    Packs/day: 2.00    Years: 50.00    Pack years: 100.00    Types: Cigarettes, E-cigarettes    Quit date: 02/07/2012    Years since quitting: 8.0  . Smokeless tobacco: Never Used  Vaping Use  . Vaping Use: Some days  . Start date: 10/06/2013  . Devices: uses no liquid  Substance and Sexual Activity  . Alcohol use: No    Alcohol/week: 0.0 standard drinks  . Drug use: No  . Sexual activity: Yes  Other Topics Concern  . Not on file  Social History Narrative   Married   Retired   Clinical cytogeneticist level of education    No children    1 cup of coffee   Social Determinants of Radio broadcast assistant Strain: Low Risk   . Difficulty of Paying Living Expenses: Not hard at all  Food Insecurity: No Food Insecurity  . Worried About Charity fundraiser in the Last Year: Never true  . Ran Out of Food in the Last Year: Never  true  Transportation Needs: No Transportation Needs  . Lack of Transportation (Medical): No  . Lack of Transportation (Non-Medical): No  Physical Activity: Not on file  Stress: No Stress Concern Present  . Feeling of Stress : Not at all  Social Connections: Not on file  Intimate Partner Violence: Not At Risk  . Fear of Current or Ex-Partner: No  . Emotionally Abused: No  . Physically Abused: No  . Sexually Abused: No    Family History  Problem Relation Age of Onset  . Hypertension Mother   . Ovarian cancer Mother 27  . Heart disease Father   . Stroke Father   . Ovarian cancer Sister        ? dx cancer had hyst.  . Breast cancer Neg Hx      Current Outpatient Medications:  .  amLODipine (NORVASC) 5 MG tablet, Take 1 tablet (5 mg total) by mouth daily., Disp: 90 tablet, Rfl: 3 .  buPROPion (WELLBUTRIN XL) 300 MG 24 hr tablet, Take 1 tablet by mouth once daily, Disp: 90 tablet, Rfl: 0 .  erythromycin ophthalmic ointment, SMARTSIG:In Eye(s), Disp: , Rfl:  .  escitalopram (LEXAPRO) 10 MG tablet, Take 1 tablet by mouth once daily, Disp: 90 tablet,  Rfl: 0 .  folic acid (FOLVITE) 1 MG tablet, Take 1 tablet by mouth once daily, Disp: 90 tablet, Rfl: 0 .  ibuprofen (ADVIL,MOTRIN) 200 MG tablet, Take 400 mg by mouth every 8 (eight) hours as needed for headache or moderate pain. , Disp: , Rfl:  .  levothyroxine (SYNTHROID) 150 MCG tablet, Take 1 tablet (150 mcg total) by mouth daily before breakfast., Disp: 30 tablet, Rfl: 3 .  lidocaine-prilocaine (EMLA) cream, Apply to affected area once, Disp: 30 g, Rfl: 3 .  loperamide (IMODIUM) 2 MG capsule, Take 1 capsule (2 mg total) by mouth every 2 (two) hours as needed for diarrhea or loose stools. Do not exceed 16mg  daily., Disp: 30 capsule, Rfl: 1 .  Multiple Vitamins-Minerals (PRESERVISION AREDS 2) CAPS, Take 1 capsule by mouth 2 (two) times a day. , Disp: , Rfl:  .  ondansetron (ZOFRAN ODT) 4 MG disintegrating tablet, Take 1 tablet (4 mg  total) by mouth every 8 (eight) hours as needed for nausea or vomiting., Disp: 20 tablet, Rfl: 0 .  polyethylene glycol (MIRALAX / GLYCOLAX) 17 g packet, Take 17 g by mouth daily as needed for mild constipation., Disp: 14 each, Rfl: 0 .  rosuvastatin (CRESTOR) 20 MG tablet, Take 1 tablet by mouth once daily, Disp: 90 tablet, Rfl: 0 No current facility-administered medications for this visit.  Facility-Administered Medications Ordered in Other Visits:  .  denosumab (XGEVA) injection 120 mg, 120 mg, Subcutaneous, Q30 days, Sindy Guadeloupe, MD, 120 mg at 03/05/19 1525 .  heparin lock flush 100 unit/mL, 500 Units, Intravenous, Once, Sindy Guadeloupe, MD .  sodium chloride flush (NS) 0.9 % injection 10 mL, 10 mL, Intravenous, PRN, Sindy Guadeloupe, MD, 10 mL at 03/10/20 0818  Physical exam:  Vitals:   03/10/20 0856  BP: 122/82  Pulse: 76  Resp: 16  Temp: 98 F (36.7 C)  TempSrc: Oral  Weight: 172 lb (78 kg)  Height: 5\' 5"  (1.651 m)   Physical Exam Eyes:     Extraocular Movements: EOM normal.  Cardiovascular:     Rate and Rhythm: Normal rate and regular rhythm.     Heart sounds: Normal heart sounds.  Pulmonary:     Effort: Pulmonary effort is normal.     Breath sounds: Normal breath sounds.  Abdominal:     General: Bowel sounds are normal.     Palpations: Abdomen is soft.  Skin:    General: Skin is warm and dry.  Neurological:     Mental Status: She is alert and oriented to person, place, and time.      CMP Latest Ref Rng & Units 02/18/2020  Glucose 70 - 99 mg/dL 95  BUN 8 - 23 mg/dL 16  Creatinine 0.44 - 1.00 mg/dL 0.95  Sodium 135 - 145 mmol/L 138  Potassium 3.5 - 5.1 mmol/L 3.6  Chloride 98 - 111 mmol/L 99  CO2 22 - 32 mmol/L 29  Calcium 8.9 - 10.3 mg/dL 9.2  Total Protein 6.5 - 8.1 g/dL 7.0  Total Bilirubin 0.3 - 1.2 mg/dL 0.3  Alkaline Phos 38 - 126 U/L 73  AST 15 - 41 U/L 24  ALT 0 - 44 U/L 17   CBC Latest Ref Rng & Units 03/10/2020  WBC 4.0 - 10.5 K/uL 5.3   Hemoglobin 12.0 - 15.0 g/dL 11.3(L)  Hematocrit 36.0 - 46.0 % 35.9(L)  Platelets 150 - 400 K/uL 239      Assessment and plan- Patient is a 70 y.o.  female with extensive stage metastatic small cell lung cancer with bone metastases currently on maintenance Tecentriq.  She is here for on treatment assessment prior to next cycle  Counts okay to proceed with maintenance Tecentriq today.  She is due for CT chest abdomen pelvis and bone scan on 03/31/2020.  I will see her back in 3 weeks for cycle 27  Patient did develop significant hypocalcemia after Xgeva and therefore she has not been receiving any bisphosphonates for her bone metastases   Visit Diagnosis 1. Encounter for antineoplastic immunotherapy   2. Small cell lung cancer, right upper lobe ([place])      Dr. Randa Evens, MD, MPH [facility] [phone] 03/10/2020 9:03 AM

## 2020-03-14 ENCOUNTER — Encounter: Payer: Self-pay | Admitting: Family Medicine

## 2020-03-14 ENCOUNTER — Other Ambulatory Visit: Payer: Self-pay

## 2020-03-14 ENCOUNTER — Ambulatory Visit (INDEPENDENT_AMBULATORY_CARE_PROVIDER_SITE_OTHER): Payer: PPO | Admitting: Family Medicine

## 2020-03-14 DIAGNOSIS — E663 Overweight: Secondary | ICD-10-CM

## 2020-03-14 DIAGNOSIS — F0390 Unspecified dementia without behavioral disturbance: Secondary | ICD-10-CM | POA: Insufficient documentation

## 2020-03-14 DIAGNOSIS — I10 Essential (primary) hypertension: Secondary | ICD-10-CM

## 2020-03-14 DIAGNOSIS — E785 Hyperlipidemia, unspecified: Secondary | ICD-10-CM

## 2020-03-14 DIAGNOSIS — R413 Other amnesia: Secondary | ICD-10-CM | POA: Diagnosis not present

## 2020-03-14 DIAGNOSIS — F03918 Unspecified dementia, unspecified severity, with other behavioral disturbance: Secondary | ICD-10-CM | POA: Insufficient documentation

## 2020-03-14 DIAGNOSIS — R4189 Other symptoms and signs involving cognitive functions and awareness: Secondary | ICD-10-CM | POA: Insufficient documentation

## 2020-03-14 NOTE — Assessment & Plan Note (Signed)
Likely with mild memory issues.  MMSE is in the normal range though has declined slightly from previously.  At this time she will monitor.  Discussed continuing with activities that stimulate her brain.  If she notices any worsening she will let us know.

## 2020-03-14 NOTE — Assessment & Plan Note (Signed)
Adequately controlled on last lipid panel.  She will continue Crestor 20 mg once daily.

## 2020-03-14 NOTE — Assessment & Plan Note (Signed)
Weight has trended down.  Encouraged continued dietary changes.  She will monitor her weight.

## 2020-03-14 NOTE — Progress Notes (Signed)
Tommi Rumps, MD Phone: [phone]  Anne [name] is a 70 y.o. female who presents today for f/u.  HYPERTENSION  Disease Monitoring  Home BP Monitoring not checking Chest pain- no    Dyspnea- no Medications  Compliance-  Taking amlodipine.   Edema- no  HYPERLIPIDEMIA Symptoms Chest pain on exertion:  no   Medications: Compliance- taking crestor Right upper quadrant pain- no  Muscle aches- no  Memory difficulty: Patient has over the last 6 months or so she is realized that she is started to forget some things.  She is forgotten to pay bills on occasion.  No issues remembering names.  She notes no ADL issues.  MMSE - Mini Mental State Exam 03/14/2020 06/04/2017  Orientation to time 4 5  Orientation to Place 5 5  Registration 3 3  Attention/ Calculation 5 5  Recall 0 0  Language- name 2 objects 2 2  Language- repeat 1 1  Language- follow 3 step command 2 3  Language- read & follow direction 1 1  Write a sentence 1 1  Copy design 1 1  Total score 25 27     Overweight: Patient has been working on losing weight.  She has cut down on portion sizes.  Social History   Tobacco Use  Smoking Status Former Smoker  . Packs/day: 2.00  . Years: 50.00  . Pack years: 100.00  . Types: Cigarettes, E-cigarettes  . Quit date: 02/07/2012  . Years since quitting: 8.1  Smokeless Tobacco Never Used    Current Outpatient Medications on File Prior to Visit  Medication Sig Dispense Refill  . amLODipine (NORVASC) 5 MG tablet Take 1 tablet (5 mg total) by mouth daily. 90 tablet 3  . buPROPion (WELLBUTRIN XL) 300 MG 24 hr tablet Take 1 tablet by mouth once daily 90 tablet 0  . erythromycin ophthalmic ointment Place 1 application into the right eye daily as needed.    Marland Kitchen escitalopram (LEXAPRO) 10 MG tablet Take 1 tablet by mouth once daily 90 tablet 0  . folic acid (FOLVITE) 1 MG tablet Take 1 tablet by mouth once daily 90 tablet 0  . ibuprofen (ADVIL,MOTRIN) 200 MG tablet Take 400 mg by  mouth every 8 (eight) hours as needed for headache or moderate pain.     Marland Kitchen levothyroxine (SYNTHROID) 150 MCG tablet Take 1 tablet (150 mcg total) by mouth daily before breakfast. 30 tablet 3  . lidocaine-prilocaine (EMLA) cream Apply to affected area once 30 g 3  . loperamide (IMODIUM) 2 MG capsule Take 1 capsule (2 mg total) by mouth every 2 (two) hours as needed for diarrhea or loose stools. Do not exceed 16mg  daily. 30 capsule 1  . Multiple Vitamins-Minerals (PRESERVISION AREDS 2) CAPS Take 1 capsule by mouth 2 (two) times a day.     . ondansetron (ZOFRAN ODT) 4 MG disintegrating tablet Take 1 tablet (4 mg total) by mouth every 8 (eight) hours as needed for nausea or vomiting. 20 tablet 0  . polyethylene glycol (MIRALAX / GLYCOLAX) 17 g packet Take 17 g by mouth daily as needed for mild constipation. 14 each 0  . rosuvastatin (CRESTOR) 20 MG tablet Take 1 tablet by mouth once daily 90 tablet 0   Current Facility-Administered Medications on File Prior to Visit  Medication Dose Route Frequency Provider Last Rate Last Admin  . denosumab (XGEVA) injection 120 mg  120 mg Subcutaneous Q30 days Sindy Guadeloupe, MD   120 mg at 03/05/19 1525  ROS see history of present illness  Objective  Physical Exam Vitals:   03/14/20 1011  BP: 120/70  Pulse: 77  Resp: 18  Temp: 98.2 F (36.8 C)  SpO2: 94%    BP Readings from Last 3 Encounters:  03/14/20 120/70  03/10/20 122/82  02/19/20 (!) 155/81   Wt Readings from Last 3 Encounters:  03/14/20 172 lb 2 oz (78.1 kg)  03/10/20 172 lb (78 kg)  02/18/20 178 lb 4.8 oz (80.9 kg)    Physical Exam Constitutional:      General: She is not in acute distress.    Appearance: She is not diaphoretic.  Cardiovascular:     Rate and Rhythm: Normal rate and regular rhythm.     Heart sounds: Normal heart sounds.  Pulmonary:     Effort: Pulmonary effort is normal.     Breath sounds: Normal breath sounds.  Musculoskeletal:        General: No edema.      Right lower leg: No edema.     Left lower leg: No edema.  Skin:    General: Skin is warm and dry.  Neurological:     Mental Status: She is alert.      Assessment/Plan: Please see individual problem list.  Problem List Items Addressed This Visit    Benign essential HTN    Well-controlled.  She will continue amlodipine 5 mg once daily.      HLD (hyperlipidemia)    Adequately controlled on last lipid panel.  She will continue Crestor 20 mg once daily.      Memory difficulty    Likely with mild memory issues.  MMSE is in the normal range though has declined slightly from previously.  At this time she will monitor.  Discussed continuing with activities that stimulate her brain.  If she notices any worsening she will let us know.      Overweight    Weight has trended down.  Encouraged continued dietary changes.  She will monitor her weight.         This visit occurred during the SARS-CoV-2 public health emergency.  Safety protocols were in place, including screening questions prior to the visit, additional usage of staff PPE, and extensive cleaning of exam room while observing appropriate contact time as indicated for disinfecting solutions.    Tommi Rumps, MD [place]

## 2020-03-14 NOTE — Patient Instructions (Signed)
Nice to see you. Please continue with activities that stimulate your brain. Please monitor his memory and if he notices worsening please let us know.

## 2020-03-14 NOTE — Assessment & Plan Note (Signed)
Well-controlled.  She will continue amlodipine 5 mg once daily. 

## 2020-03-19 ENCOUNTER — Other Ambulatory Visit: Payer: Self-pay | Admitting: Family Medicine

## 2020-03-31 ENCOUNTER — Other Ambulatory Visit: Payer: Self-pay

## 2020-03-31 ENCOUNTER — Ambulatory Visit
Admission: RE | Admit: 2020-03-31 | Discharge: 2020-03-31 | Disposition: A | Discharge status: Home / Self Care | Payer: PPO | Source: Ambulatory Visit | Attending: Oncology | Admitting: Oncology

## 2020-03-31 DIAGNOSIS — I700 Atherosclerosis of aorta: Secondary | ICD-10-CM | POA: Diagnosis not present

## 2020-03-31 DIAGNOSIS — Z5112 Encounter for antineoplastic immunotherapy: Secondary | ICD-10-CM | POA: Diagnosis not present

## 2020-03-31 DIAGNOSIS — K7460 Unspecified cirrhosis of liver: Secondary | ICD-10-CM | POA: Diagnosis not present

## 2020-03-31 DIAGNOSIS — C3411 Malignant neoplasm of upper lobe, right bronchus or lung: Secondary | ICD-10-CM

## 2020-03-31 DIAGNOSIS — C7951 Secondary malignant neoplasm of bone: Secondary | ICD-10-CM | POA: Diagnosis not present

## 2020-03-31 DIAGNOSIS — J432 Centrilobular emphysema: Secondary | ICD-10-CM | POA: Diagnosis not present

## 2020-03-31 DIAGNOSIS — C3490 Malignant neoplasm of unspecified part of unspecified bronchus or lung: Secondary | ICD-10-CM | POA: Diagnosis not present

## 2020-03-31 DIAGNOSIS — I2510 Atherosclerotic heart disease of native coronary artery without angina pectoris: Secondary | ICD-10-CM | POA: Diagnosis not present

## 2020-03-31 MED ORDER — TECHNETIUM TC 99M MEDRONATE IV KIT
20.0000 | PACK | Freq: Once | INTRAVENOUS | Status: AC | PRN
  Administered 2020-03-31: 21.534 via INTRAVENOUS

## 2020-03-31 MED ORDER — IOHEXOL 300 MG/ML  SOLN
100.0000 mL | Freq: Once | INTRAMUSCULAR | Status: AC | PRN
  Administered 2020-03-31: 100 mL via INTRAVENOUS

## 2020-04-01 ENCOUNTER — Telehealth: Payer: Self-pay | Admitting: Oncology

## 2020-04-01 ENCOUNTER — Inpatient Hospital Stay: Payer: PPO

## 2020-04-01 ENCOUNTER — Inpatient Hospital Stay: Payer: PPO | Admitting: Oncology

## 2020-04-01 NOTE — Telephone Encounter (Signed)
Attempted to call pt to notify her of upcoming appts--r/s from today's missed appts 04/01/20. Mailing AVS.

## 2020-04-06 ENCOUNTER — Inpatient Hospital Stay: Payer: PPO

## 2020-04-06 ENCOUNTER — Inpatient Hospital Stay (HOSPITAL_BASED_OUTPATIENT_CLINIC_OR_DEPARTMENT_OTHER): Payer: PPO | Admitting: Oncology

## 2020-04-06 ENCOUNTER — Inpatient Hospital Stay: Payer: PPO | Attending: Oncology

## 2020-04-06 VITALS — BP 124/77 | HR 81 | Temp 97.9°F | Resp 18 | Wt 171.0 lb

## 2020-04-06 DIAGNOSIS — C3411 Malignant neoplasm of upper lobe, right bronchus or lung: Secondary | ICD-10-CM | POA: Diagnosis not present

## 2020-04-06 DIAGNOSIS — Z5112 Encounter for antineoplastic immunotherapy: Secondary | ICD-10-CM | POA: Diagnosis not present

## 2020-04-06 DIAGNOSIS — Z79890 Hormone replacement therapy (postmenopausal): Secondary | ICD-10-CM | POA: Insufficient documentation

## 2020-04-06 DIAGNOSIS — E785 Hyperlipidemia, unspecified: Secondary | ICD-10-CM | POA: Insufficient documentation

## 2020-04-06 DIAGNOSIS — Z8041 Family history of malignant neoplasm of ovary: Secondary | ICD-10-CM | POA: Insufficient documentation

## 2020-04-06 DIAGNOSIS — Z87891 Personal history of nicotine dependence: Secondary | ICD-10-CM | POA: Diagnosis not present

## 2020-04-06 DIAGNOSIS — I10 Essential (primary) hypertension: Secondary | ICD-10-CM | POA: Diagnosis not present

## 2020-04-06 DIAGNOSIS — C7951 Secondary malignant neoplasm of bone: Secondary | ICD-10-CM | POA: Insufficient documentation

## 2020-04-06 DIAGNOSIS — Z923 Personal history of irradiation: Secondary | ICD-10-CM | POA: Insufficient documentation

## 2020-04-06 DIAGNOSIS — K7460 Unspecified cirrhosis of liver: Secondary | ICD-10-CM | POA: Insufficient documentation

## 2020-04-06 DIAGNOSIS — E669 Obesity, unspecified: Secondary | ICD-10-CM | POA: Diagnosis not present

## 2020-04-06 DIAGNOSIS — R7989 Other specified abnormal findings of blood chemistry: Secondary | ICD-10-CM

## 2020-04-06 DIAGNOSIS — Z9049 Acquired absence of other specified parts of digestive tract: Secondary | ICD-10-CM | POA: Diagnosis not present

## 2020-04-06 DIAGNOSIS — Z79899 Other long term (current) drug therapy: Secondary | ICD-10-CM | POA: Diagnosis not present

## 2020-04-06 LAB — COMPREHENSIVE METABOLIC PANEL
ALT: 15 U/L (ref 0–44)
AST: 22 U/L (ref 15–41)
Albumin: 3.9 g/dL (ref 3.5–5.0)
Alkaline Phosphatase: 73 U/L (ref 38–126)
Anion gap: 10 (ref 5–15)
BUN: 14 mg/dL (ref 8–23)
CO2: 30 mmol/L (ref 22–32)
Calcium: 9.3 mg/dL (ref 8.9–10.3)
Chloride: 101 mmol/L (ref 98–111)
Creatinine, Ser: 0.77 mg/dL (ref 0.44–1.00)
GFR, Estimated: >60 mL/min (ref >60)
Glucose, Bld: 98 mg/dL (ref 70–99)
Potassium: 4.1 mmol/L (ref 3.5–5.1)
Sodium: 141 mmol/L (ref 135–145)
Total Bilirubin: 0.7 mg/dL (ref 0.3–1.2)
Total Protein: 7.2 g/dL (ref 6.5–8.1)

## 2020-04-06 LAB — CBC WITH DIFFERENTIAL/PLATELET
Abs Immature Granulocytes: 0.01 10*3/uL (ref 0.00–0.07)
Basophils Absolute: 0 10*3/uL (ref 0.0–0.1)
Basophils Relative: 1 %
Eosinophils Absolute: 0.2 10*3/uL (ref 0.0–0.5)
Eosinophils Relative: 3 %
HCT: 37.6 % (ref 36.0–46.0)
Hemoglobin: 11.7 g/dL — ABNORMAL LOW (ref 12.0–15.0)
Immature Granulocytes: 0 %
Lymphocytes Relative: 18 %
Lymphs Abs: 1 10*3/uL (ref 0.7–4.0)
MCH: 27.3 pg (ref 26.0–34.0)
MCHC: 31.1 g/dL (ref 30.0–36.0)
MCV: 87.6 fL (ref 80.0–100.0)
Monocytes Absolute: 0.4 10*3/uL (ref 0.1–1.0)
Monocytes Relative: 7 %
Neutro Abs: 3.9 10*3/uL (ref 1.7–7.7)
Neutrophils Relative %: 71 %
Platelets: 172 10*3/uL (ref 150–400)
RBC: 4.29 MIL/uL (ref 3.87–5.11)
RDW: 17.6 % — ABNORMAL HIGH (ref 11.5–15.5)
WBC: 5.4 10*3/uL (ref 4.0–10.5)
nRBC: 0 % (ref 0.0–0.2)

## 2020-04-06 LAB — TSH: TSH: 0.218 u[IU]/mL — ABNORMAL LOW (ref 0.350–4.500)

## 2020-04-06 MED ORDER — SODIUM CHLORIDE 0.9 % IV SOLN
Freq: Once | INTRAVENOUS | Status: AC
  Filled 2020-04-06: qty 250

## 2020-04-06 MED ORDER — SODIUM CHLORIDE 0.9 % IV SOLN
1200.0000 mg | Freq: Once | INTRAVENOUS | Status: AC
  Administered 2020-04-06: 1200 mg via INTRAVENOUS
  Filled 2020-04-06: qty 20

## 2020-04-06 MED ORDER — HEPARIN SOD (PORK) LOCK FLUSH 100 UNIT/ML IV SOLN
500.0000 [IU] | Freq: Once | INTRAVENOUS | Status: AC | PRN
  Administered 2020-04-06: 500 [IU]
  Filled 2020-04-06: qty 5

## 2020-04-06 MED ORDER — HEPARIN SOD (PORK) LOCK FLUSH 100 UNIT/ML IV SOLN
INTRAVENOUS | Status: AC
  Filled 2020-04-06: qty 5

## 2020-04-06 NOTE — Progress Notes (Signed)
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 She has been referred to [place] for findings of bone metastases and her recent MRI. She has a prior history of 3 packs/day day smoking for over 45 years and quit smoking 5 years ago.She had a CT chest abdomen pelvis in 2018 which showed a 5 mm lung nodule in the left lower lobe. Recently over the last 2 months patient has been having worsening back pain and was referred to orthopedics. 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 Pathologic fracture of S1.  PET scan showed 2 RUL lung nodules, hilar and mediastinal adenopathy and widespread bone mets. MRI brain negative.  Patientcompletedpalliative RT to her spine. Bronchoscopy showed small cell lung cancer. Palliative carboplatin, etoposide and Tecentriq started on 08/18/2018. Scans after 4 cycles showed stable disease. She is on maintenance tecentriq  Interval history-Mrs. Cammarata was last seen in clinic on 03/10/2020.  She received Tecentriq.  She had a CT chest abdomen pelvis and bone scan on 04/01/2020.  Today, she denies  any new complaints.  Her appetite and weight are stable.  Denies any new aches or pains.  Denies any respiratory concerns.  ECOG PS- 1 Pain scale- 0 Opioid associated constipation- no  Review of systems- Review of Systems  Constitutional: Negative for chills, fever, malaise/fatigue and weight loss.  HENT: Negative for congestion, ear discharge and nosebleeds.   Eyes: Negative for blurred vision.  Respiratory: Negative for cough, hemoptysis, sputum production, shortness of breath and wheezing.   Cardiovascular: Negative for chest pain, palpitations, orthopnea and claudication.  Gastrointestinal: Negative for abdominal pain, blood in stool, constipation, diarrhea, heartburn, melena, nausea and vomiting.  Genitourinary: Negative for dysuria, flank pain, frequency, hematuria and urgency.  Musculoskeletal: Negative for back pain, joint pain and myalgias.  Skin: Negative for rash.  Neurological: Negative for dizziness, tingling, focal weakness, seizures, weakness and headaches.  Endo/Heme/Allergies: Does not bruise/bleed easily.  Psychiatric/Behavioral: Negative for depression and suicidal ideas. The patient does not have insomnia.       Allergies  Allergen Reactions  . Bee Venom Swelling     Past Medical History:  Diagnosis Date  . Arthritis   . Cirrhosis ([place])   . Depression   . GERD (gastroesophageal reflux disease)   . Hyperlipidemia   . Hypertension   . Lung cancer ([place])   . Metastatic bone cancer [facility])      Past Surgical History:  Procedure Laterality Date  . APPENDECTOMY  1971  . CHOLECYSTECTOMY  1971  . COLONOSCOPY WITH PROPOFOL N/A 11/15/2016   Procedure: COLONOSCOPY WITH PROPOFOL;  Surgeon: Jonathon Bellows, MD;  Location: [facility] ENDOSCOPY;  Service: Gastroenterology;  Laterality: N/A;  . ENDOBRONCHIAL ULTRASOUND Right 07/30/2018   Procedure:  ENDOBRONCHIAL ULTRASOUND;  Surgeon: Laverle Hobby, MD;  Location: [facility] ORS;  Service: Pulmonary;  Laterality: Right;  .  ESOPHAGOGASTRODUODENOSCOPY (EGD) WITH PROPOFOL N/A 01/07/2017   Procedure: ESOPHAGOGASTRODUODENOSCOPY (EGD) WITH PROPOFOL;  Surgeon: Jonathon Bellows, MD;  Location: [facility] ENDOSCOPY;  Service: Gastroenterology;  Laterality: N/A;  . LAPAROSCOPY N/A 03/01/2017   Procedure: LAPAROSCOPY DIAGNOSTIC;  Surgeon: Robert Bellow, MD;  Location: [facility] ORS;  Service: General;  Laterality: N/A;  . PORTA CATH INSERTION N/A 08/14/2018   Procedure: PORTA CATH INSERTION;  Surgeon: Algernon Huxley, MD;  Location: [place] CV LAB;  Service: Cardiovascular;  Laterality: N/A;  . TONSILECTOMY, ADENOIDECTOMY, BILATERAL MYRINGOTOMY [place]  . TONSILLECTOMY    . VENTRAL HERNIA REPAIR N/A 03/01/2017   10 x 14 CM Ventralight ST mesh, intraperitoneal location.   . VENTRAL HERNIA REPAIR N/A 03/01/2017   Procedure: HERNIA REPAIR VENTRAL ADULT;  Surgeon: Robert Bellow, MD;  Location: [facility] ORS;  Service: General;  Laterality: N/A;    Social History   Socioeconomic History  . Marital status: Married    Spouse name: [name]  . Number of children: Not on file  . Years of education: Not on file  . Highest education level: Not on file  Occupational History  . Not on file  Tobacco Use  . Smoking status: Former Smoker    Packs/day: 2.00    Years: 50.00    Pack years: 100.00    Types: Cigarettes, E-cigarettes    Quit date: 02/07/2012    Years since quitting: 8.1  . Smokeless tobacco: Never Used  Vaping Use  . Vaping Use: Some days  . Start date: 10/06/2013  . Devices: uses no liquid  Substance and Sexual Activity  . Alcohol use: No    Alcohol/week: 0.0 standard drinks  . Drug use: No  . Sexual activity: Yes  Other Topics Concern  . Not on file  Social History Narrative   Married   Retired   Clinical cytogeneticist level of education    No children    1 cup of coffee   Social Determinants of Radio broadcast assistant Strain: Low Risk   . Difficulty of Paying Living Expenses: Not hard at all  Food  Insecurity: No Food Insecurity  . Worried About Charity fundraiser in the Last Year: Never true  . Ran Out of Food in the Last Year: Never true  Transportation Needs: No Transportation Needs  . Lack of Transportation (Medical): No  . Lack of Transportation (Non-Medical): No  Physical Activity: Not on file  Stress: No Stress Concern Present  . Feeling of Stress : Not at all  Social Connections: Not on file  Intimate Partner Violence: Not At Risk  . Fear of Current or Ex-Partner: No  . Emotionally Abused: No  . Physically Abused: No  . Sexually Abused: No    Family History  Problem Relation Age of Onset  . Hypertension Mother   . Ovarian cancer Mother 31  . Heart disease Father   . Stroke Father   . Ovarian cancer Sister        ? dx cancer had hyst.  . Breast cancer Neg Hx      Current Outpatient Medications:  .  amLODipine (NORVASC) 5 MG tablet, Take 1 tablet (5 mg total) by mouth daily., Disp: 90 tablet, Rfl: 3 .  buPROPion (WELLBUTRIN XL) 300 MG 24 hr tablet, Take 1 tablet by mouth once daily, Disp: 90 tablet, Rfl: 0 .  erythromycin ophthalmic ointment, Place 1 application into the right eye daily as needed., Disp: , Rfl:  .  escitalopram (LEXAPRO) 10 MG tablet, Take 1 tablet by mouth once daily, Disp: 90 tablet, Rfl: 0 .  folic acid (FOLVITE) 1 MG tablet, Take 1 tablet by mouth once daily, Disp: 90 tablet, Rfl: 0 .  ibuprofen (ADVIL,MOTRIN) 200 MG tablet, Take 400 mg by mouth every 8 (eight) hours as needed for headache or moderate pain. , Disp: , Rfl:  .  levothyroxine (SYNTHROID) 150 MCG tablet, Take 1 tablet (150 mcg total) by mouth daily before breakfast., Disp: 30 tablet, Rfl: 3 .  lidocaine-prilocaine (EMLA) cream, Apply to affected area once, Disp: 30 g, Rfl: 3 .  loperamide (IMODIUM) 2 MG capsule, Take 1 capsule (2 mg total) by mouth every 2 (two) hours as needed for diarrhea or loose stools. Do not exceed 16mg  daily., Disp: 30 capsule, Rfl: 1 .  Multiple  Vitamins-Minerals (PRESERVISION AREDS 2) CAPS, Take 1 capsule by mouth 2 (two) times a day. , Disp: , Rfl:  .  ondansetron (ZOFRAN ODT) 4 MG disintegrating tablet, Take 1 tablet (4 mg total) by mouth every 8 (eight) hours as needed for nausea or vomiting., Disp: 20 tablet, Rfl: 0 .  pantoprazole (PROTONIX) 40 MG tablet, TAKE 1 TABLET BY MOUTH TWICE DAILY BEFORE A MEAL, Disp: 180 tablet, Rfl: 0 .  polyethylene glycol (MIRALAX / GLYCOLAX) 17 g packet, Take 17 g by mouth daily as needed for mild constipation., Disp: 14 each, Rfl: 0 .  rosuvastatin (CRESTOR) 20 MG tablet, Take 1 tablet by mouth once daily, Disp: 90 tablet, Rfl: 0 No current facility-administered medications for this visit.  Facility-Administered Medications Ordered in Other Visits:  .  denosumab (XGEVA) injection 120 mg, 120 mg, Subcutaneous, Q30 days, Sindy Guadeloupe, MD, 120 mg at 03/05/19 1525  Physical exam:  Vitals:   04/06/20 0933  BP: 124/77  Pulse: 81  Resp: 18  Temp: 97.9 F (36.6 C)  TempSrc: Tympanic  SpO2: 96%  Weight: 171 lb (77.6 kg)   Physical Exam Cardiovascular:     Rate and Rhythm: Normal rate and regular rhythm.     Heart sounds: Normal heart sounds.  Pulmonary:     Effort: Pulmonary effort is normal.     Breath sounds: Normal breath sounds.  Abdominal:     General: Bowel sounds are normal.     Palpations: Abdomen is soft.  Skin:    General: Skin is warm and dry.  Neurological:     Mental Status: She is alert and oriented to person, place, and time.      CMP Latest Ref Rng & Units 04/06/2020  Glucose 70 - 99 mg/dL 98  BUN 8 - 23 mg/dL 14  Creatinine 0.44 - 1.00 mg/dL 0.77  Sodium 135 - 145 mmol/L 141  Potassium 3.5 - 5.1 mmol/L 4.1  Chloride 98 - 111 mmol/L 101  CO2 22 - 32 mmol/L 30  Calcium 8.9 - 10.3 mg/dL 9.3  Total Protein 6.5 - 8.1 g/dL 7.2  Total Bilirubin 0.3 - 1.2 mg/dL 0.7  Alkaline Phos 38 - 126 U/L 73  AST 15 - 41 U/L 22  ALT 0 - 44 U/L 15   CBC Latest Ref Rng & Units  04/06/2020  WBC 4.0 - 10.5 K/uL 5.4  Hemoglobin 12.0 - 15.0 g/dL 11.7(L)  Hematocrit 36.0 - 46.0 % 37.6  Platelets 150 - 400 K/uL 172      Assessment and plan- Patient  is a 70 y.o. female with extensive stage metastatic small cell lung cancer with bone metastases currently on maintenance Tecentriq.  She is here for on treatment assessment prior to next cycle  We reviewed her CT CAP and Bone scan from 03/31/2020.  CT CAP showed unchanged appearance of sclerotic osseous metastasis throughout the axial skeleton.  Stable small pulmonary nodules in right upper lobe.  No evidence of new metastatic disease.  Bone scan showed overall decrease in activity of the multiple lesions in the axillary and appendicular skeleton.  No new lesions were identified.  Counts okay to proceed with maintenance Tecentriq today.    TSH 0.218 (0.139).  She is currently on 150 mcg of Synthroid. Monitor.   Disposition: RTC in 3 weeks for next cycle of Tecentriq and MD assessment.  Visit Diagnosis 1. Small cell lung cancer, right upper lobe (HCC)    Greater than 50% was spent in counseling and coordination of care with this patient including but not limited to discussion of the relevant topics above (See A&P) including, but not limited to diagnosis and management of acute and chronic medical conditions.    Faythe Casa, NP 04/06/2020 2:50 PM  [facility] [phone] 04/06/2020 2:50 PM

## 2020-04-12 DIAGNOSIS — H353290 Exudative age-related macular degeneration, unspecified eye, stage unspecified: Secondary | ICD-10-CM | POA: Diagnosis not present

## 2020-04-12 DIAGNOSIS — K766 Portal hypertension: Secondary | ICD-10-CM | POA: Diagnosis not present

## 2020-04-12 DIAGNOSIS — K7460 Unspecified cirrhosis of liver: Secondary | ICD-10-CM | POA: Diagnosis not present

## 2020-04-12 DIAGNOSIS — M059 Rheumatoid arthritis with rheumatoid factor, unspecified: Secondary | ICD-10-CM | POA: Diagnosis not present

## 2020-04-12 DIAGNOSIS — J449 Chronic obstructive pulmonary disease, unspecified: Secondary | ICD-10-CM | POA: Diagnosis not present

## 2020-04-12 DIAGNOSIS — D692 Other nonthrombocytopenic purpura: Secondary | ICD-10-CM | POA: Diagnosis not present

## 2020-04-12 DIAGNOSIS — E441 Mild protein-calorie malnutrition: Secondary | ICD-10-CM | POA: Diagnosis not present

## 2020-04-12 DIAGNOSIS — T451X5D Adverse effect of antineoplastic and immunosuppressive drugs, subsequent encounter: Secondary | ICD-10-CM | POA: Diagnosis not present

## 2020-04-12 DIAGNOSIS — C3490 Malignant neoplasm of unspecified part of unspecified bronchus or lung: Secondary | ICD-10-CM | POA: Diagnosis not present

## 2020-04-12 DIAGNOSIS — C7951 Secondary malignant neoplasm of bone: Secondary | ICD-10-CM | POA: Diagnosis not present

## 2020-04-12 DIAGNOSIS — D61810 Antineoplastic chemotherapy induced pancytopenia: Secondary | ICD-10-CM | POA: Diagnosis not present

## 2020-04-12 DIAGNOSIS — I739 Peripheral vascular disease, unspecified: Secondary | ICD-10-CM | POA: Diagnosis not present

## 2020-04-15 ENCOUNTER — Other Ambulatory Visit: Payer: Self-pay | Admitting: Oncology

## 2020-04-21 DIAGNOSIS — D692 Other nonthrombocytopenic purpura: Secondary | ICD-10-CM | POA: Diagnosis not present

## 2020-04-21 DIAGNOSIS — F3342 Major depressive disorder, recurrent, in full remission: Secondary | ICD-10-CM | POA: Diagnosis not present

## 2020-04-21 DIAGNOSIS — H353290 Exudative age-related macular degeneration, unspecified eye, stage unspecified: Secondary | ICD-10-CM | POA: Diagnosis not present

## 2020-04-21 DIAGNOSIS — E785 Hyperlipidemia, unspecified: Secondary | ICD-10-CM | POA: Diagnosis not present

## 2020-04-21 DIAGNOSIS — I739 Peripheral vascular disease, unspecified: Secondary | ICD-10-CM | POA: Diagnosis not present

## 2020-04-21 DIAGNOSIS — C7951 Secondary malignant neoplasm of bone: Secondary | ICD-10-CM | POA: Diagnosis not present

## 2020-04-21 DIAGNOSIS — K766 Portal hypertension: Secondary | ICD-10-CM | POA: Diagnosis not present

## 2020-04-21 DIAGNOSIS — D61810 Antineoplastic chemotherapy induced pancytopenia: Secondary | ICD-10-CM | POA: Diagnosis not present

## 2020-04-21 DIAGNOSIS — J449 Chronic obstructive pulmonary disease, unspecified: Secondary | ICD-10-CM | POA: Diagnosis not present

## 2020-04-21 DIAGNOSIS — C3490 Malignant neoplasm of unspecified part of unspecified bronchus or lung: Secondary | ICD-10-CM | POA: Diagnosis not present

## 2020-04-21 DIAGNOSIS — K219 Gastro-esophageal reflux disease without esophagitis: Secondary | ICD-10-CM | POA: Diagnosis not present

## 2020-04-21 DIAGNOSIS — K7460 Unspecified cirrhosis of liver: Secondary | ICD-10-CM | POA: Diagnosis not present

## 2020-04-22 ENCOUNTER — Other Ambulatory Visit: Payer: Self-pay

## 2020-04-26 ENCOUNTER — Encounter: Payer: Self-pay | Admitting: Family Medicine

## 2020-04-26 ENCOUNTER — Other Ambulatory Visit: Payer: Self-pay

## 2020-04-26 ENCOUNTER — Ambulatory Visit (INDEPENDENT_AMBULATORY_CARE_PROVIDER_SITE_OTHER): Payer: PPO | Admitting: Family Medicine

## 2020-04-26 VITALS — BP 125/80 | HR 84 | Temp 98.5°F | Ht 66.0 in | Wt 169.2 lb

## 2020-04-26 DIAGNOSIS — H938X9 Other specified disorders of ear, unspecified ear: Secondary | ICD-10-CM | POA: Insufficient documentation

## 2020-04-26 DIAGNOSIS — H938X3 Other specified disorders of ear, bilateral: Secondary | ICD-10-CM | POA: Diagnosis not present

## 2020-04-26 MED ORDER — FLUTICASONE PROPIONATE 50 MCG/ACT NA SUSP: 2.0000 | Freq: Every day | NASAL | 6 refills | Status: DC

## 2020-04-26 NOTE — Patient Instructions (Signed)
Nice to see you. Please try the Flonase. I referred you to ENT.  If the Flonase resolves her symptoms she can cancel ENT appointment.

## 2020-04-26 NOTE — Progress Notes (Signed)
Tommi Rumps, MD Phone: [phone]  Anne [name] is a 70 y.o. female who presents today for same day visit.   Ear fullness: Patient notes this has been going on for a while.  She has tinnitus and fullness in her bilateral ears.  Notes that her hearing has decreased.  She very occasionally has some congestion though nothing consistent.  No sneezing.  No pain in her ears.  Social History   Tobacco Use  Smoking Status Former Smoker  . Packs/day: 2.00  . Years: 50.00  . Pack years: 100.00  . Types: Cigarettes, E-cigarettes  . Quit date: 02/07/2012  . Years since quitting: 8.2  Smokeless Tobacco Never Used    Current Outpatient Medications on File Prior to Visit  Medication Sig Dispense Refill  . amLODipine (NORVASC) 5 MG tablet Take 1 tablet (5 mg total) by mouth daily. 90 tablet 3  . buPROPion (WELLBUTRIN XL) 300 MG 24 hr tablet Take 1 tablet by mouth once daily 90 tablet 0  . erythromycin ophthalmic ointment Place 1 application into the right eye daily as needed.    Marland Kitchen escitalopram (LEXAPRO) 10 MG tablet Take 1 tablet by mouth once daily 90 tablet 0  . EUTHYROX 150 MCG tablet TAKE 1 TABLET BY MOUTH ONCE DAILY BEFORE BREAKFAST 90 tablet 0  . folic acid (FOLVITE) 1 MG tablet Take 1 tablet by mouth once daily 90 tablet 0  . ibuprofen (ADVIL,MOTRIN) 200 MG tablet Take 400 mg by mouth every 8 (eight) hours as needed for headache or moderate pain.     Marland Kitchen lidocaine-prilocaine (EMLA) cream Apply to affected area once 30 g 3  . loperamide (IMODIUM) 2 MG capsule Take 1 capsule (2 mg total) by mouth every 2 (two) hours as needed for diarrhea or loose stools. Do not exceed 16mg  daily. 30 capsule 1  . Multiple Vitamins-Minerals (PRESERVISION AREDS 2) CAPS Take 1 capsule by mouth 2 (two) times a day.     . ondansetron (ZOFRAN ODT) 4 MG disintegrating tablet Take 1 tablet (4 mg total) by mouth every 8 (eight) hours as needed for nausea or vomiting. 20 tablet 0  . pantoprazole (PROTONIX) 40 MG  tablet TAKE 1 TABLET BY MOUTH TWICE DAILY BEFORE A MEAL 180 tablet 0  . polyethylene glycol (MIRALAX / GLYCOLAX) 17 g packet Take 17 g by mouth daily as needed for mild constipation. 14 each 0  . rosuvastatin (CRESTOR) 20 MG tablet Take 1 tablet by mouth once daily 90 tablet 0   Current Facility-Administered Medications on File Prior to Visit  Medication Dose Route Frequency Provider Last Rate Last Admin  . denosumab (XGEVA) injection 120 mg  120 mg Subcutaneous Q30 days Sindy Guadeloupe, MD   120 mg at 03/05/19 1525     ROS see history of present illness  Objective  Physical Exam Vitals:   04/26/20 0912 04/26/20 0931  BP: 125/80   Pulse: 84   Temp: 98.5 F (36.9 C)   SpO2: (!) 89% 93%    BP Readings from Last 3 Encounters:  04/26/20 125/80  04/06/20 124/77  03/14/20 120/70   Wt Readings from Last 3 Encounters:  04/26/20 169 lb 3.2 oz (76.7 kg)  04/06/20 171 lb (77.6 kg)  03/14/20 172 lb 2 oz (78.1 kg)    Physical Exam HENT:     Right Ear: Ear canal normal.     Left Ear: Tympanic membrane and ear canal normal.     Ears:     Comments: Right  TM with apparent clear fluid, no erythema or inflammation.     Assessment/Plan: Please see individual problem list.  Problem List Items Addressed This Visit    Ear fullness - Primary    Possible eustachian tube dysfunction.  Start on Flonase.  Refer to ENT.  She can cancel ENT appointment if the Flonase resolves her symptoms.      Relevant Medications   fluticasone (FLONASE) 50 MCG/ACT nasal spray   Other Relevant Orders   Ambulatory referral to ENT     Oxygen was noted to be minimally low initially while the patient was wearing her mask.  She denies any issues with shortness of breath.  She took her mask off and breathed normally.  Her oxygen rose to 93% on room air.  I suspect this is related to her wearing a mask.  She will monitor for any shortness of breath.    This visit occurred during the SARS-CoV-2 public health  emergency.  Safety protocols were in place, including screening questions prior to the visit, additional usage of staff PPE, and extensive cleaning of exam room while observing appropriate contact time as indicated for disinfecting solutions.    Tommi Rumps, MD [place]

## 2020-04-26 NOTE — Assessment & Plan Note (Signed)
Possible eustachian tube dysfunction.  Start on Flonase.  Refer to ENT.  She can cancel ENT appointment if the Flonase resolves her symptoms.

## 2020-04-28 ENCOUNTER — Inpatient Hospital Stay: Payer: PPO

## 2020-04-28 ENCOUNTER — Inpatient Hospital Stay: Payer: PPO | Admitting: Oncology

## 2020-04-28 ENCOUNTER — Telehealth: Payer: Self-pay | Admitting: Oncology

## 2020-04-28 NOTE — Telephone Encounter (Signed)
Spoke with pt's daughter to check in on patient as she hasn't shown up to her appt today and this is unlike the patient. Daughter stated that she hasn't spoken to her in a few days and that she would attempt to get in contact with her today.

## 2020-05-02 ENCOUNTER — Other Ambulatory Visit: Payer: Self-pay

## 2020-05-02 ENCOUNTER — Inpatient Hospital Stay: Payer: PPO

## 2020-05-02 ENCOUNTER — Encounter: Payer: Self-pay | Admitting: Oncology

## 2020-05-02 ENCOUNTER — Inpatient Hospital Stay (HOSPITAL_BASED_OUTPATIENT_CLINIC_OR_DEPARTMENT_OTHER): Payer: PPO | Admitting: Oncology

## 2020-05-02 VITALS — BP 132/88 | HR 78 | Temp 96.5°F | Resp 20 | Wt 170.5 lb

## 2020-05-02 DIAGNOSIS — C3411 Malignant neoplasm of upper lobe, right bronchus or lung: Secondary | ICD-10-CM | POA: Diagnosis not present

## 2020-05-02 DIAGNOSIS — Z5112 Encounter for antineoplastic immunotherapy: Secondary | ICD-10-CM | POA: Diagnosis not present

## 2020-05-02 LAB — COMPREHENSIVE METABOLIC PANEL
ALT: 14 U/L (ref 0–44)
AST: 22 U/L (ref 15–41)
Albumin: 3.8 g/dL (ref 3.5–5.0)
Alkaline Phosphatase: 106 U/L (ref 38–126)
Anion gap: 9 (ref 5–15)
BUN: 22 mg/dL (ref 8–23)
CO2: 31 mmol/L (ref 22–32)
Calcium: 8.9 mg/dL (ref 8.9–10.3)
Chloride: 102 mmol/L (ref 98–111)
Creatinine, Ser: 0.96 mg/dL (ref 0.44–1.00)
GFR, Estimated: >60 mL/min (ref >60)
Glucose, Bld: 95 mg/dL (ref 70–99)
Potassium: 4.1 mmol/L (ref 3.5–5.1)
Sodium: 142 mmol/L (ref 135–145)
Total Bilirubin: 0.8 mg/dL (ref 0.3–1.2)
Total Protein: 7.1 g/dL (ref 6.5–8.1)

## 2020-05-02 LAB — CBC WITH DIFFERENTIAL/PLATELET
Abs Immature Granulocytes: 0.01 10*3/uL (ref 0.00–0.07)
Basophils Absolute: 0.1 10*3/uL (ref 0.0–0.1)
Basophils Relative: 1 %
Eosinophils Absolute: 1.4 10*3/uL — ABNORMAL HIGH (ref 0.0–0.5)
Eosinophils Relative: 25 %
HCT: 38.9 % (ref 36.0–46.0)
Hemoglobin: 12 g/dL (ref 12.0–15.0)
Immature Granulocytes: 0 %
Lymphocytes Relative: 15 %
Lymphs Abs: 0.8 10*3/uL (ref 0.7–4.0)
MCH: 27.1 pg (ref 26.0–34.0)
MCHC: 30.8 g/dL (ref 30.0–36.0)
MCV: 88 fL (ref 80.0–100.0)
Monocytes Absolute: 0.4 10*3/uL (ref 0.1–1.0)
Monocytes Relative: 7 %
Neutro Abs: 2.9 10*3/uL (ref 1.7–7.7)
Neutrophils Relative %: 52 %
Platelets: 155 10*3/uL (ref 150–400)
RBC: 4.42 MIL/uL (ref 3.87–5.11)
RDW: 18 % — ABNORMAL HIGH (ref 11.5–15.5)
WBC: 5.6 10*3/uL (ref 4.0–10.5)
nRBC: 0 % (ref 0.0–0.2)

## 2020-05-02 MED ORDER — HEPARIN SOD (PORK) LOCK FLUSH 100 UNIT/ML IV SOLN
500.0000 [IU] | Freq: Once | INTRAVENOUS | Status: AC | PRN
  Administered 2020-05-02: 500 [IU]
  Filled 2020-05-02: qty 5

## 2020-05-02 MED ORDER — SODIUM CHLORIDE 0.9 % IV SOLN
1200.0000 mg | Freq: Once | INTRAVENOUS | Status: AC
  Administered 2020-05-02: 1200 mg via INTRAVENOUS
  Filled 2020-05-02: qty 20

## 2020-05-02 MED ORDER — SODIUM CHLORIDE 0.9% FLUSH
10.0000 mL | INTRAVENOUS | Status: DC | PRN
  Filled 2020-05-02: qty 10

## 2020-05-02 MED ORDER — HEPARIN SOD (PORK) LOCK FLUSH 100 UNIT/ML IV SOLN
INTRAVENOUS | Status: AC
  Filled 2020-05-02: qty 5

## 2020-05-02 MED ORDER — SODIUM CHLORIDE 0.9 % IV SOLN
Freq: Once | INTRAVENOUS | Status: AC
Start: 2020-05-02 — End: 2020-05-02
  Filled 2020-05-02: qty 250

## 2020-05-02 NOTE — Progress Notes (Signed)
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 She has been referred to [place] for findings of bone metastases and her recent MRI. She has a prior history of 3 packs/day day smoking for over 45 years and quit smoking 5 years ago.She had a CT chest abdomen pelvis in 2018 which showed a 5 mm lung nodule in the left lower lobe. Recently over the last 2 months patient has been having worsening back pain and was referred to orthopedics. 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 Pathologic fracture of S1.  PET scan showed 2 RUL lung nodules, hilar and mediastinal adenopathy and widespread bone mets. MRI brain negative.  Patientcompletedpalliative RT to her spine. Bronchoscopy showed small cell lung cancer. Palliative carboplatin, etoposide and Tecentriq started on 08/18/2018. Scans after 4 cycles showed stable disease. She is on maintenance tecentriq   Interval history-patient is doing well and denies any complaints at this time.  Denies any nausea vomiting diarrhea.  Denies any pain.  Appetite and weight have remained  stable  ECOG PS- 1 Pain scale- 0 Opioid associated constipation- no  Review of systems- Review of Systems  Constitutional: Negative for chills, fever, malaise/fatigue and weight loss.  HENT: Negative for congestion, ear discharge and nosebleeds.   Eyes: Negative for blurred vision.  Respiratory: Negative for cough, hemoptysis, sputum production, shortness of breath and wheezing.   Cardiovascular: Negative for chest pain, palpitations, orthopnea and claudication.  Gastrointestinal: Negative for abdominal pain, blood in stool, constipation, diarrhea, heartburn, melena, nausea and vomiting.  Genitourinary: Negative for dysuria, flank pain, frequency, hematuria and urgency.  Musculoskeletal: Negative for back pain, joint pain and myalgias.  Skin: Negative for rash.  Neurological: Negative for dizziness, tingling, focal weakness, seizures, weakness and headaches.  Endo/Heme/Allergies: Does not bruise/bleed easily.  Psychiatric/Behavioral: Negative for depression and suicidal ideas. The patient does not have insomnia.        Allergies  Allergen Reactions  . Bee Venom Swelling     Past Medical History:  Diagnosis Date  . Arthritis   . Cirrhosis ([place])   . Depression   . GERD (gastroesophageal reflux disease)   . Hyperlipidemia   . Hypertension   . Lung cancer ([place])   . Metastatic bone cancer [facility])      Past Surgical History:  Procedure Laterality Date  . APPENDECTOMY  1971  . CHOLECYSTECTOMY  1971  . COLONOSCOPY WITH PROPOFOL N/A 11/15/2016   Procedure: COLONOSCOPY WITH PROPOFOL;  Surgeon: Jonathon Bellows, MD;  Location: [facility] ENDOSCOPY;  Service: Gastroenterology;  Laterality: N/A;  . ENDOBRONCHIAL ULTRASOUND Right 07/30/2018   Procedure: ENDOBRONCHIAL ULTRASOUND;  Surgeon: Laverle Hobby, MD;  Location: [facility] ORS;  Service: Pulmonary;  Laterality: Right;  . ESOPHAGOGASTRODUODENOSCOPY (EGD)  WITH PROPOFOL N/A 01/07/2017   Procedure: ESOPHAGOGASTRODUODENOSCOPY (EGD) WITH  PROPOFOL;  Surgeon: Jonathon Bellows, MD;  Location: [facility] ENDOSCOPY;  Service: Gastroenterology;  Laterality: N/A;  . LAPAROSCOPY N/A 03/01/2017   Procedure: LAPAROSCOPY DIAGNOSTIC;  Surgeon: Robert Bellow, MD;  Location: [facility] ORS;  Service: General;  Laterality: N/A;  . PORTA CATH INSERTION N/A 08/14/2018   Procedure: PORTA CATH INSERTION;  Surgeon: Algernon Huxley, MD;  Location: [place] CV LAB;  Service: Cardiovascular;  Laterality: N/A;  . TONSILECTOMY, ADENOIDECTOMY, BILATERAL MYRINGOTOMY [place]  . TONSILLECTOMY    . VENTRAL HERNIA REPAIR N/A 03/01/2017   10 x 14 CM Ventralight ST mesh, intraperitoneal location.   . VENTRAL HERNIA REPAIR N/A 03/01/2017   Procedure: HERNIA REPAIR VENTRAL ADULT;  Surgeon: Robert Bellow, MD;  Location: [facility] ORS;  Service: General;  Laterality: N/A;    Social History   Socioeconomic History  . Marital status: Married    Spouse name: [name]  . Number of children: Not on file  . Years of education: Not on file  . Highest education level: Not on file  Occupational History  . Not on file  Tobacco Use  . Smoking status: Former Smoker    Packs/day: 2.00    Years: 50.00    Pack years: 100.00    Types: Cigarettes, E-cigarettes    Quit date: 02/07/2012    Years since quitting: 8.2  . Smokeless tobacco: Never Used  Vaping Use  . Vaping Use: Some days  . Start date: 10/06/2013  . Devices: uses no liquid  Substance and Sexual Activity  . Alcohol use: No    Alcohol/week: 0.0 standard drinks  . Drug use: No  . Sexual activity: Yes  Other Topics Concern  . Not on file  Social History Narrative   Married   Retired   Clinical cytogeneticist level of education    No children    1 cup of coffee   Social Determinants of Radio broadcast assistant Strain: Low Risk   . Difficulty of Paying Living Expenses: Not hard at all  Food Insecurity: No Food Insecurity  . Worried About Charity fundraiser in the Last Year: Never true  . Ran Out of Food  in the Last Year: Never true  Transportation Needs: No Transportation Needs  . Lack of Transportation (Medical): No  . Lack of Transportation (Non-Medical): No  Physical Activity: Not on file  Stress: No Stress Concern Present  . Feeling of Stress : Not at all  Social Connections: Not on file  Intimate Partner Violence: Not At Risk  . Fear of Current or Ex-Partner: No  . Emotionally Abused: No  . Physically Abused: No  . Sexually Abused: No    Family History  Problem Relation Age of Onset  . Hypertension Mother   . Ovarian cancer Mother 84  . Heart disease Father   . Stroke Father   . Ovarian cancer Sister        ? dx cancer had hyst.  . Breast cancer Neg Hx      Current Outpatient Medications:  .  amLODipine (NORVASC) 5 MG tablet, Take 1 tablet (5 mg total) by mouth daily., Disp: 90 tablet, Rfl: 3 .  buPROPion (WELLBUTRIN XL) 300 MG 24 hr tablet, Take 1 tablet by mouth once daily, Disp: 90 tablet, Rfl: 0 .  erythromycin ophthalmic ointment, Place 1 application into the right eye daily as needed., Disp: , Rfl:  .  escitalopram (  LEXAPRO) 10 MG tablet, Take 1 tablet by mouth once daily, Disp: 90 tablet, Rfl: 0 .  EUTHYROX 150 MCG tablet, TAKE 1 TABLET BY MOUTH ONCE DAILY BEFORE BREAKFAST, Disp: 90 tablet, Rfl: 0 .  fluticasone (FLONASE) 50 MCG/ACT nasal spray, Place 2 sprays into both nostrils daily., Disp: 16 g, Rfl: 6 .  folic acid (FOLVITE) 1 MG tablet, Take 1 tablet by mouth once daily, Disp: 90 tablet, Rfl: 0 .  ibuprofen (ADVIL,MOTRIN) 200 MG tablet, Take 400 mg by mouth every 8 (eight) hours as needed for headache or moderate pain. , Disp: , Rfl:  .  lidocaine-prilocaine (EMLA) cream, Apply to affected area once, Disp: 30 g, Rfl: 3 .  loperamide (IMODIUM) 2 MG capsule, Take 1 capsule (2 mg total) by mouth every 2 (two) hours as needed for diarrhea or loose stools. Do not exceed 16mg  daily., Disp: 30 capsule, Rfl: 1 .  Multiple Vitamins-Minerals (PRESERVISION AREDS 2) CAPS,  Take 1 capsule by mouth 2 (two) times a day. , Disp: , Rfl:  .  ondansetron (ZOFRAN ODT) 4 MG disintegrating tablet, Take 1 tablet (4 mg total) by mouth every 8 (eight) hours as needed for nausea or vomiting., Disp: 20 tablet, Rfl: 0 .  pantoprazole (PROTONIX) 40 MG tablet, TAKE 1 TABLET BY MOUTH TWICE DAILY BEFORE A MEAL, Disp: 180 tablet, Rfl: 0 .  polyethylene glycol (MIRALAX / GLYCOLAX) 17 g packet, Take 17 g by mouth daily as needed for mild constipation., Disp: 14 each, Rfl: 0 .  rosuvastatin (CRESTOR) 20 MG tablet, Take 1 tablet by mouth once daily, Disp: 90 tablet, Rfl: 0  Current Facility-Administered Medications:  .  sodium chloride flush (NS) 0.9 % injection 10 mL, 10 mL, Intracatheter, PRN, Sindy Guadeloupe, MD  Facility-Administered Medications Ordered in Other Visits:  .  denosumab (XGEVA) injection 120 mg, 120 mg, Subcutaneous, Q30 days, Sindy Guadeloupe, MD, 120 mg at 03/05/19 1525  Physical exam:  Vitals:   05/02/20 0946  BP: 132/88  Pulse: 78  Resp: 20  Temp: (!) 96.5 F (35.8 C)  SpO2: 92%  Weight: 170 lb 8 oz (77.3 kg)   Physical Exam Constitutional:      General: She is not in acute distress. Cardiovascular:     Rate and Rhythm: Normal rate and regular rhythm.     Heart sounds: Normal heart sounds.  Pulmonary:     Effort: Pulmonary effort is normal.     Breath sounds: Normal breath sounds.  Abdominal:     General: Bowel sounds are normal.     Palpations: Abdomen is soft.  Skin:    General: Skin is warm and dry.  Neurological:     Mental Status: She is alert and oriented to person, place, and time.      CMP Latest Ref Rng & Units 05/02/2020  Glucose 70 - 99 mg/dL 95  BUN 8 - 23 mg/dL 22  Creatinine 0.44 - 1.00 mg/dL 0.96  Sodium 135 - 145 mmol/L 142  Potassium 3.5 - 5.1 mmol/L 4.1  Chloride 98 - 111 mmol/L 102  CO2 22 - 32 mmol/L 31  Calcium 8.9 - 10.3 mg/dL 8.9  Total Protein 6.5 - 8.1 g/dL 7.1  Total Bilirubin 0.3 - 1.2 mg/dL 0.8  Alkaline Phos 38  - 126 U/L 106  AST 15 - 41 U/L 22  ALT 0 - 44 U/L 14   CBC Latest Ref Rng & Units 05/02/2020  WBC 4.0 - 10.5 K/uL 5.6  Hemoglobin 12.0 -  15.0 g/dL 12.0  Hematocrit 36.0 - 46.0 % 38.9  Platelets 150 - 400 K/uL 155      Assessment and plan- Patient is a 70 y.o. female with extensive stage metastatic small cell lung cancer with bone metastases currently on maintenance Tecentriq.  He is here for on treatment assessment prior to next cycle of maintenance Tecentriq  Tolerating treatment well so far and will proceed with next cycle of maintenance Tecentriq today.  I will see her back in 3 weeks for cycle 28.  Bone metastases: Delton See is not presently being given as she was significantly hypocalcemic following Xgeva previously.  Bone metastases are stable based on recent scans    Visit Diagnosis 1. Small cell lung cancer, right upper lobe ([place])   2. Encounter for antineoplastic immunotherapy      Dr. Randa Evens, MD, MPH [facility] [phone] 05/02/2020 1:21 PM

## 2020-05-02 NOTE — Progress Notes (Signed)
Pt tolerated all infusions well today with no complaints.  Pt left chemo suite stable and ambulatory.

## 2020-05-05 ENCOUNTER — Other Ambulatory Visit: Payer: Self-pay | Admitting: Family Medicine

## 2020-05-05 DIAGNOSIS — F331 Major depressive disorder, recurrent, moderate: Secondary | ICD-10-CM

## 2020-05-10 ENCOUNTER — Other Ambulatory Visit: Payer: Self-pay | Admitting: Oncology

## 2020-05-10 ENCOUNTER — Other Ambulatory Visit: Payer: Self-pay | Admitting: Family Medicine

## 2020-05-10 DIAGNOSIS — H353211 Exudative age-related macular degeneration, right eye, with active choroidal neovascularization: Secondary | ICD-10-CM | POA: Diagnosis not present

## 2020-05-20 ENCOUNTER — Inpatient Hospital Stay: Payer: PPO | Attending: Oncology

## 2020-05-20 ENCOUNTER — Inpatient Hospital Stay: Payer: PPO

## 2020-05-20 ENCOUNTER — Inpatient Hospital Stay (HOSPITAL_BASED_OUTPATIENT_CLINIC_OR_DEPARTMENT_OTHER): Payer: PPO | Admitting: Oncology

## 2020-05-20 ENCOUNTER — Encounter: Payer: Self-pay | Admitting: Oncology

## 2020-05-20 VITALS — BP 134/75 | HR 78 | Temp 97.1°F | Wt 164.4 lb

## 2020-05-20 DIAGNOSIS — C3411 Malignant neoplasm of upper lobe, right bronchus or lung: Secondary | ICD-10-CM | POA: Insufficient documentation

## 2020-05-20 DIAGNOSIS — Z79890 Hormone replacement therapy (postmenopausal): Secondary | ICD-10-CM | POA: Diagnosis not present

## 2020-05-20 DIAGNOSIS — E669 Obesity, unspecified: Secondary | ICD-10-CM | POA: Diagnosis not present

## 2020-05-20 DIAGNOSIS — C7951 Secondary malignant neoplasm of bone: Secondary | ICD-10-CM | POA: Insufficient documentation

## 2020-05-20 DIAGNOSIS — Z87891 Personal history of nicotine dependence: Secondary | ICD-10-CM | POA: Insufficient documentation

## 2020-05-20 DIAGNOSIS — E063 Autoimmune thyroiditis: Secondary | ICD-10-CM

## 2020-05-20 DIAGNOSIS — Z9049 Acquired absence of other specified parts of digestive tract: Secondary | ICD-10-CM | POA: Diagnosis not present

## 2020-05-20 DIAGNOSIS — Z8041 Family history of malignant neoplasm of ovary: Secondary | ICD-10-CM | POA: Insufficient documentation

## 2020-05-20 DIAGNOSIS — E785 Hyperlipidemia, unspecified: Secondary | ICD-10-CM | POA: Diagnosis not present

## 2020-05-20 DIAGNOSIS — I10 Essential (primary) hypertension: Secondary | ICD-10-CM | POA: Diagnosis not present

## 2020-05-20 DIAGNOSIS — Z5112 Encounter for antineoplastic immunotherapy: Secondary | ICD-10-CM | POA: Insufficient documentation

## 2020-05-20 DIAGNOSIS — K7460 Unspecified cirrhosis of liver: Secondary | ICD-10-CM | POA: Insufficient documentation

## 2020-05-20 DIAGNOSIS — Z923 Personal history of irradiation: Secondary | ICD-10-CM | POA: Insufficient documentation

## 2020-05-20 LAB — CBC WITH DIFFERENTIAL/PLATELET
Abs Immature Granulocytes: 0.01 10*3/uL (ref 0.00–0.07)
Basophils Absolute: 0 10*3/uL (ref 0.0–0.1)
Basophils Relative: 1 %
Eosinophils Absolute: 0.1 10*3/uL (ref 0.0–0.5)
Eosinophils Relative: 3 %
HCT: 40.5 % (ref 36.0–46.0)
Hemoglobin: 12.7 g/dL (ref 12.0–15.0)
Immature Granulocytes: 0 %
Lymphocytes Relative: 16 %
Lymphs Abs: 0.7 10*3/uL (ref 0.7–4.0)
MCH: 27.5 pg (ref 26.0–34.0)
MCHC: 31.4 g/dL (ref 30.0–36.0)
MCV: 87.9 fL (ref 80.0–100.0)
Monocytes Absolute: 0.4 10*3/uL (ref 0.1–1.0)
Monocytes Relative: 9 %
Neutro Abs: 3.1 10*3/uL (ref 1.7–7.7)
Neutrophils Relative %: 71 %
Platelets: 173 10*3/uL (ref 150–400)
RBC: 4.61 MIL/uL (ref 3.87–5.11)
RDW: 17.2 % — ABNORMAL HIGH (ref 11.5–15.5)
WBC: 4.4 10*3/uL (ref 4.0–10.5)
nRBC: 0 % (ref 0.0–0.2)

## 2020-05-20 LAB — COMPREHENSIVE METABOLIC PANEL
ALT: 15 U/L (ref 0–44)
AST: 26 U/L (ref 15–41)
Albumin: 4.1 g/dL (ref 3.5–5.0)
Alkaline Phosphatase: 114 U/L (ref 38–126)
Anion gap: 11 (ref 5–15)
BUN: 20 mg/dL (ref 8–23)
CO2: 27 mmol/L (ref 22–32)
Calcium: 9.3 mg/dL (ref 8.9–10.3)
Chloride: 103 mmol/L (ref 98–111)
Creatinine, Ser: 0.94 mg/dL (ref 0.44–1.00)
GFR, Estimated: >60 mL/min (ref >60)
Glucose, Bld: 114 mg/dL — ABNORMAL HIGH (ref 70–99)
Potassium: 3.3 mmol/L — ABNORMAL LOW (ref 3.5–5.1)
Sodium: 141 mmol/L (ref 135–145)
Total Bilirubin: 0.5 mg/dL (ref 0.3–1.2)
Total Protein: 7.6 g/dL (ref 6.5–8.1)

## 2020-05-20 MED ORDER — HEPARIN SOD (PORK) LOCK FLUSH 100 UNIT/ML IV SOLN
INTRAVENOUS | Status: AC
  Filled 2020-05-20: qty 5

## 2020-05-20 MED ORDER — SODIUM CHLORIDE 0.9 % IV SOLN
1200.0000 mg | Freq: Once | INTRAVENOUS | Status: AC
  Administered 2020-05-20: 1200 mg via INTRAVENOUS
  Filled 2020-05-20: qty 20

## 2020-05-20 MED ORDER — SODIUM CHLORIDE 0.9 % IV SOLN
Freq: Once | INTRAVENOUS | Status: AC
  Filled 2020-05-20: qty 250

## 2020-05-20 MED ORDER — HEPARIN SOD (PORK) LOCK FLUSH 100 UNIT/ML IV SOLN
500.0000 [IU] | Freq: Once | INTRAVENOUS | Status: AC | PRN
  Administered 2020-05-20: 500 [IU]
  Filled 2020-05-20: qty 5

## 2020-05-20 NOTE — Progress Notes (Signed)
Pt doing good, not much appetite. She makes herself to eat

## 2020-05-20 NOTE — Progress Notes (Signed)
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 She has been referred to [place] for findings of bone metastases and her recent MRI. She has a prior history of 3 packs/day day smoking for over 45 years and quit smoking 5 years ago.She had a CT chest abdomen pelvis in 2018 which showed a 5 mm lung nodule in the left lower lobe. Recently over the last 2 months patient has been having worsening back pain and was referred to orthopedics. 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 Pathologic fracture of S1.  PET scan showed 2 RUL lung nodules, hilar and mediastinal adenopathy and widespread bone mets. MRI brain negative.  Patientcompletedpalliative RT to her spine. Bronchoscopy showed small cell lung cancer. Palliative carboplatin, etoposide and Tecentriq started on 08/18/2018. Scans after 4 cycles showed stable disease. She is on maintenance tecentriq  Interval history-patient reports doing well and denies any specific complaints at this time.  Appetite and weight have remained stable.  Denies any pain shortness of breath or  cough  ECOG PS- 1 Pain scale- 0   Review of systems- Review of Systems  Constitutional: Negative for chills, fever, malaise/fatigue and weight loss.  HENT: Negative for congestion, ear discharge and nosebleeds.   Eyes: Negative for blurred vision.  Respiratory: Negative for cough, hemoptysis, sputum production, shortness of breath and wheezing.   Cardiovascular: Negative for chest pain, palpitations, orthopnea and claudication.  Gastrointestinal: Negative for abdominal pain, blood in stool, constipation, diarrhea, heartburn, melena, nausea and vomiting.  Genitourinary: Negative for dysuria, flank pain, frequency, hematuria and urgency.  Musculoskeletal: Negative for back pain, joint pain and myalgias.  Skin: Negative for rash.  Neurological: Negative for dizziness, tingling, focal weakness, seizures, weakness and headaches.  Endo/Heme/Allergies: Does not bruise/bleed easily.  Psychiatric/Behavioral: Negative for depression and suicidal ideas. The patient does not have insomnia.      Allergies  Allergen Reactions  . Bee Venom Swelling     Past Medical History:  Diagnosis Date  . Arthritis   . Cirrhosis ([place])   . Depression   . GERD (gastroesophageal reflux disease)   . Hyperlipidemia   . Hypertension   . Lung cancer ([place])   . Metastatic bone cancer [facility])      Past Surgical History:  Procedure Laterality Date  . APPENDECTOMY  1971  . CHOLECYSTECTOMY  1971  . COLONOSCOPY WITH PROPOFOL N/A 11/15/2016   Procedure: COLONOSCOPY WITH PROPOFOL;  Surgeon: Jonathon Bellows, MD;  Location: [facility] ENDOSCOPY;  Service: Gastroenterology;  Laterality: N/A;  . ENDOBRONCHIAL ULTRASOUND Right 07/30/2018   Procedure: ENDOBRONCHIAL ULTRASOUND;  Surgeon: Laverle Hobby, MD;  Location: [facility] ORS;  Service: Pulmonary;  Laterality: Right;  . ESOPHAGOGASTRODUODENOSCOPY (EGD) WITH PROPOFOL N/A 01/07/2017   Procedure:  ESOPHAGOGASTRODUODENOSCOPY (EGD) WITH PROPOFOL;  Surgeon: Jonathon Bellows, MD;   Location: [facility] ENDOSCOPY;  Service: Gastroenterology;  Laterality: N/A;  . LAPAROSCOPY N/A 03/01/2017   Procedure: LAPAROSCOPY DIAGNOSTIC;  Surgeon: Robert Bellow, MD;  Location: [facility] ORS;  Service: General;  Laterality: N/A;  . PORTA CATH INSERTION N/A 08/14/2018   Procedure: PORTA CATH INSERTION;  Surgeon: Algernon Huxley, MD;  Location: [place] CV LAB;  Service: Cardiovascular;  Laterality: N/A;  . TONSILECTOMY, ADENOIDECTOMY, BILATERAL MYRINGOTOMY [place]  . TONSILLECTOMY    . VENTRAL HERNIA REPAIR N/A 03/01/2017   10 x 14 CM Ventralight ST mesh, intraperitoneal location.   . VENTRAL HERNIA REPAIR N/A 03/01/2017   Procedure: HERNIA REPAIR VENTRAL ADULT;  Surgeon: Robert Bellow, MD;  Location: [facility] ORS;  Service: General;  Laterality: N/A;    Social History   Socioeconomic History  . Marital status: Married    Spouse name: [name]  . Number of children: Not on file  . Years of education: Not on file  . Highest education level: Not on file  Occupational History  . Not on file  Tobacco Use  . Smoking status: Former Smoker    Packs/day: 2.00    Years: 50.00    Pack years: 100.00    Types: Cigarettes, E-cigarettes    Quit date: 02/07/2012    Years since quitting: 8.2  . Smokeless tobacco: Never Used  Vaping Use  . Vaping Use: Some days  . Start date: 10/06/2013  . Devices: uses no liquid  Substance and Sexual Activity  . Alcohol use: No    Alcohol/week: 0.0 standard drinks  . Drug use: No  . Sexual activity: Yes  Other Topics Concern  . Not on file  Social History Narrative   Married   Retired   Clinical cytogeneticist level of education    No children    1 cup of coffee   Social Determinants of Radio broadcast assistant Strain: Low Risk   . Difficulty of Paying Living Expenses: Not hard at all  Food Insecurity: No Food Insecurity  . Worried About Charity fundraiser in the Last Year: Never true  . Ran Out of Food in the Last Year: Never true   Transportation Needs: No Transportation Needs  . Lack of Transportation (Medical): No  . Lack of Transportation (Non-Medical): No  Physical Activity: Not on file  Stress: No Stress Concern Present  . Feeling of Stress : Not at all  Social Connections: Not on file  Intimate Partner Violence: Not At Risk  . Fear of Current or Ex-Partner: No  . Emotionally Abused: No  . Physically Abused: No  . Sexually Abused: No    Family History  Problem Relation Age of Onset  . Hypertension Mother   . Ovarian cancer Mother 80  . Heart disease Father   . Stroke Father   . Ovarian cancer Sister        ? dx cancer had hyst.  . Breast cancer Neg Hx      Current Outpatient Medications:  .  amLODipine (NORVASC) 5 MG tablet, Take 1 tablet (5 mg total) by mouth daily., Disp: 90 tablet, Rfl: 3 .  buPROPion (WELLBUTRIN XL) 300 MG 24 hr tablet, Take 1 tablet by mouth once daily, Disp: 90 tablet, Rfl: 0 .  erythromycin ophthalmic ointment, Place 1 application into the right eye daily as needed., Disp: , Rfl:  .  escitalopram (LEXAPRO) 10 MG tablet, Take 1 tablet  by mouth once daily, Disp: 90 tablet, Rfl: 0 .  EUTHYROX 150 MCG tablet, TAKE 1 TABLET BY MOUTH ONCE DAILY BEFORE BREAKFAST, Disp: 90 tablet, Rfl: 0 .  fluticasone (FLONASE) 50 MCG/ACT nasal spray, Place 2 sprays into both nostrils daily. (Patient taking differently: Place 2 sprays into both nostrils daily as needed.), Disp: 16 g, Rfl: 6 .  folic acid (FOLVITE) 1 MG tablet, Take 1 tablet by mouth once daily, Disp: 90 tablet, Rfl: 0 .  ibuprofen (ADVIL,MOTRIN) 200 MG tablet, Take 400 mg by mouth every 8 (eight) hours as needed for headache or moderate pain. , Disp: , Rfl:  .  Multiple Vitamins-Minerals (PRESERVISION AREDS 2) CAPS, Take 1 capsule by mouth 2 (two) times a day. , Disp: , Rfl:  .  pantoprazole (PROTONIX) 40 MG tablet, TAKE 1 TABLET BY MOUTH TWICE DAILY BEFORE A MEAL, Disp: 180 tablet, Rfl: 0 .  polyethylene glycol (MIRALAX / GLYCOLAX)  17 g packet, Take 17 g by mouth daily as needed for mild constipation., Disp: 14 each, Rfl: 0 .  rosuvastatin (CRESTOR) 20 MG tablet, Take 1 tablet by mouth once daily, Disp: 90 tablet, Rfl: 0 .  lidocaine-prilocaine (EMLA) cream, Apply to affected area once (Patient not taking: Reported on 05/20/2020), Disp: 30 g, Rfl: 3 .  loperamide (IMODIUM) 2 MG capsule, Take 1 capsule (2 mg total) by mouth every 2 (two) hours as needed for diarrhea or loose stools. Do not exceed 16mg  daily. (Patient not taking: Reported on 05/20/2020), Disp: 30 capsule, Rfl: 1 .  ondansetron (ZOFRAN ODT) 4 MG disintegrating tablet, Take 1 tablet (4 mg total) by mouth every 8 (eight) hours as needed for nausea or vomiting. (Patient not taking: Reported on 05/20/2020), Disp: 20 tablet, Rfl: 0 No current facility-administered medications for this visit.  Facility-Administered Medications Ordered in Other Visits:  .  denosumab (XGEVA) injection 120 mg, 120 mg, Subcutaneous, Q30 days, Sindy Guadeloupe, MD, 120 mg at 03/05/19 1525  Physical exam:  Vitals:   05/20/20 0938  BP: 134/75  Pulse: 78  Temp: (!) 97.1 F (36.2 C)  TempSrc: Tympanic  SpO2: 100%  Weight: 164 lb 6.4 oz (74.6 kg)   Physical Exam Constitutional:      General: She is not in acute distress. Cardiovascular:     Rate and Rhythm: Normal rate and regular rhythm.     Heart sounds: Normal heart sounds.  Pulmonary:     Effort: Pulmonary effort is normal.     Breath sounds: Normal breath sounds.  Skin:    General: Skin is warm and dry.  Neurological:     Mental Status: She is alert and oriented to person, place, and time.      CMP Latest Ref Rng & Units 05/20/2020  Glucose 70 - 99 mg/dL 114(H)  BUN 8 - 23 mg/dL 20  Creatinine 0.44 - 1.00 mg/dL 0.94  Sodium 135 - 145 mmol/L 141  Potassium 3.5 - 5.1 mmol/L 3.3(L)  Chloride 98 - 111 mmol/L 103  CO2 22 - 32 mmol/L 27  Calcium 8.9 - 10.3 mg/dL 9.3  Total Protein 6.5 - 8.1 g/dL 7.6  Total Bilirubin 0.3 -  1.2 mg/dL 0.5  Alkaline Phos 38 - 126 U/L 114  AST 15 - 41 U/L 26  ALT 0 - 44 U/L 15   CBC Latest Ref Rng & Units 05/20/2020  WBC 4.0 - 10.5 K/uL 4.4  Hemoglobin 12.0 - 15.0 g/dL 12.7  Hematocrit 36.0 - 46.0 % 40.5  Platelets  150 - 400 K/uL 173     Assessment and plan- Patient is a 70 y.o. female  with extensive stage metastatic small cell lung cancer with bone metastases currently on maintenance Tecentriq.   She is here for on treatment assessment prior to next cycle of maintenance Tecentriq  Counts okay to proceed with cycle 28 of maintenance Tecentriq today.  I will see her back in 3 weeks for cycle 29.  Overall tolerating treatment well without any significant side effects.  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  She did have a low TSH a month ago.  We will want to make sure that the trends persist before reducing her levothyroxine dose.  She is presently on 150 mcg daily   Visit Diagnosis 1. Encounter for antineoplastic immunotherapy   2. Small cell lung cancer, right upper lobe ([place])   3. Autoimmune hypothyroidism      Dr. Randa Evens, MD, MPH [facility] [phone] 05/20/2020 9:52 AM

## 2020-06-10 ENCOUNTER — Encounter: Payer: Self-pay | Admitting: Oncology

## 2020-06-10 ENCOUNTER — Inpatient Hospital Stay: Payer: PPO

## 2020-06-10 ENCOUNTER — Inpatient Hospital Stay (HOSPITAL_BASED_OUTPATIENT_CLINIC_OR_DEPARTMENT_OTHER): Payer: PPO | Admitting: Oncology

## 2020-06-10 ENCOUNTER — Inpatient Hospital Stay: Payer: PPO | Attending: Oncology

## 2020-06-10 VITALS — BP 128/83 | HR 79 | Temp 96.3°F | Resp 16 | Wt 168.9 lb

## 2020-06-10 DIAGNOSIS — Z79890 Hormone replacement therapy (postmenopausal): Secondary | ICD-10-CM | POA: Diagnosis not present

## 2020-06-10 DIAGNOSIS — E785 Hyperlipidemia, unspecified: Secondary | ICD-10-CM | POA: Diagnosis not present

## 2020-06-10 DIAGNOSIS — E669 Obesity, unspecified: Secondary | ICD-10-CM | POA: Diagnosis not present

## 2020-06-10 DIAGNOSIS — K7460 Unspecified cirrhosis of liver: Secondary | ICD-10-CM | POA: Insufficient documentation

## 2020-06-10 DIAGNOSIS — I10 Essential (primary) hypertension: Secondary | ICD-10-CM | POA: Insufficient documentation

## 2020-06-10 DIAGNOSIS — Z8041 Family history of malignant neoplasm of ovary: Secondary | ICD-10-CM | POA: Insufficient documentation

## 2020-06-10 DIAGNOSIS — Z5112 Encounter for antineoplastic immunotherapy: Secondary | ICD-10-CM

## 2020-06-10 DIAGNOSIS — Z923 Personal history of irradiation: Secondary | ICD-10-CM | POA: Insufficient documentation

## 2020-06-10 DIAGNOSIS — C3411 Malignant neoplasm of upper lobe, right bronchus or lung: Secondary | ICD-10-CM

## 2020-06-10 DIAGNOSIS — Z9049 Acquired absence of other specified parts of digestive tract: Secondary | ICD-10-CM | POA: Insufficient documentation

## 2020-06-10 DIAGNOSIS — C7951 Secondary malignant neoplasm of bone: Secondary | ICD-10-CM | POA: Insufficient documentation

## 2020-06-10 DIAGNOSIS — Z87891 Personal history of nicotine dependence: Secondary | ICD-10-CM | POA: Diagnosis not present

## 2020-06-10 DIAGNOSIS — E063 Autoimmune thyroiditis: Secondary | ICD-10-CM

## 2020-06-10 LAB — COMPREHENSIVE METABOLIC PANEL
ALT: 16 U/L (ref 0–44)
AST: 25 U/L (ref 15–41)
Albumin: 4.1 g/dL (ref 3.5–5.0)
Alkaline Phosphatase: 122 U/L (ref 38–126)
Anion gap: 11 (ref 5–15)
BUN: 20 mg/dL (ref 8–23)
CO2: 27 mmol/L (ref 22–32)
Calcium: 9.2 mg/dL (ref 8.9–10.3)
Chloride: 99 mmol/L (ref 98–111)
Creatinine, Ser: 0.92 mg/dL (ref 0.44–1.00)
GFR, Estimated: >60 mL/min (ref >60)
Glucose, Bld: 89 mg/dL (ref 70–99)
Potassium: 3.8 mmol/L (ref 3.5–5.1)
Sodium: 137 mmol/L (ref 135–145)
Total Bilirubin: 0.8 mg/dL (ref 0.3–1.2)
Total Protein: 7.6 g/dL (ref 6.5–8.1)

## 2020-06-10 LAB — CBC WITH DIFFERENTIAL/PLATELET
Abs Immature Granulocytes: 0.01 K/uL (ref 0.00–0.07)
Basophils Absolute: 0 K/uL (ref 0.0–0.1)
Basophils Relative: 1 %
Eosinophils Absolute: 0 K/uL (ref 0.0–0.5)
Eosinophils Relative: 1 %
HCT: 38.4 % (ref 36.0–46.0)
Hemoglobin: 12.4 g/dL (ref 12.0–15.0)
Immature Granulocytes: 0 %
Lymphocytes Relative: 18 %
Lymphs Abs: 0.8 K/uL (ref 0.7–4.0)
MCH: 28.1 pg (ref 26.0–34.0)
MCHC: 32.3 g/dL (ref 30.0–36.0)
MCV: 87.1 fL (ref 80.0–100.0)
Monocytes Absolute: 0.3 K/uL (ref 0.1–1.0)
Monocytes Relative: 7 %
Neutro Abs: 3.2 K/uL (ref 1.7–7.7)
Neutrophils Relative %: 73 %
Platelets: 160 K/uL (ref 150–400)
RBC: 4.41 MIL/uL (ref 3.87–5.11)
RDW: 16.1 % — ABNORMAL HIGH (ref 11.5–15.5)
WBC: 4.4 K/uL (ref 4.0–10.5)
nRBC: 0 % (ref 0.0–0.2)

## 2020-06-10 LAB — T4, FREE: Free T4: 0.86 ng/dL (ref 0.61–1.12)

## 2020-06-10 LAB — TSH: TSH: 13.263 u[IU]/mL — ABNORMAL HIGH (ref 0.350–4.500)

## 2020-06-10 MED ORDER — HEPARIN SOD (PORK) LOCK FLUSH 100 UNIT/ML IV SOLN
INTRAVENOUS | Status: AC
  Filled 2020-06-10: qty 5

## 2020-06-10 MED ORDER — SODIUM CHLORIDE 0.9 % IV SOLN
Freq: Once | INTRAVENOUS | Status: AC
  Filled 2020-06-10: qty 250

## 2020-06-10 MED ORDER — HEPARIN SOD (PORK) LOCK FLUSH 100 UNIT/ML IV SOLN
500.0000 [IU] | Freq: Once | INTRAVENOUS | Status: AC | PRN
  Administered 2020-06-10: 500 [IU]
  Filled 2020-06-10: qty 5

## 2020-06-10 MED ORDER — SODIUM CHLORIDE 0.9 % IV SOLN
1200.0000 mg | Freq: Once | INTRAVENOUS | Status: AC
  Administered 2020-06-10: 1200 mg via INTRAVENOUS
  Filled 2020-06-10: qty 20

## 2020-06-10 NOTE — Progress Notes (Signed)
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 She has been referred to [place] for findings of bone metastases and her recent MRI. She has a prior history of 3 packs/day day smoking for over 45 years and quit smoking 5 years ago.She had a CT chest abdomen pelvis in 2018 which showed a 5 mm lung nodule in the left lower lobe. Recently over the last 2 months patient has been having worsening back pain and was referred to orthopedics. 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 Pathologic fracture of S1.  PET scan showed 2 RUL lung nodules, hilar and mediastinal adenopathy and widespread bone mets. MRI brain negative.  Patientcompletedpalliative RT to her spine. Bronchoscopy showed small cell lung cancer. Palliative carboplatin, etoposide and Tecentriq started on 08/18/2018. Scans after 4 cycles showed stable disease. She is on maintenance tecentriq  Interval history-patient reports doing well overall and denies any specific complaints at this time.  Denies any new aches and pains.  Appetite and weight have remained  stable  ECOG PS- 1 Pain scale- 0   Review of systems- Review of Systems  Constitutional: Negative for chills, fever, malaise/fatigue and weight loss.  HENT: Negative for congestion, ear discharge and nosebleeds.   Eyes: Negative for blurred vision.  Respiratory: Negative for cough, hemoptysis, sputum production, shortness of breath and wheezing.   Cardiovascular: Negative for chest pain, palpitations, orthopnea and claudication.  Gastrointestinal: Negative for abdominal pain, blood in stool, constipation, diarrhea, heartburn, melena, nausea and vomiting.  Genitourinary: Negative for dysuria, flank pain, frequency, hematuria and urgency.  Musculoskeletal: Negative for back pain, joint pain and myalgias.  Skin: Negative for rash.  Neurological: Negative for dizziness, tingling, focal weakness, seizures, weakness and headaches.  Endo/Heme/Allergies: Does not bruise/bleed easily.  Psychiatric/Behavioral: Negative for depression and suicidal ideas. The patient does not have insomnia.       Allergies  Allergen Reactions  . Bee Venom Swelling     Past Medical History:  Diagnosis Date  . Arthritis   . Cirrhosis ([place])   . Depression   . GERD (gastroesophageal reflux disease)   . Hyperlipidemia   . Hypertension   . Lung cancer ([place])   . Metastatic bone cancer [facility])      Past Surgical History:  Procedure Laterality Date  . APPENDECTOMY  1971  . CHOLECYSTECTOMY  1971  . COLONOSCOPY WITH PROPOFOL N/A 11/15/2016   Procedure: COLONOSCOPY WITH PROPOFOL;  Surgeon: Jonathon Bellows, MD;  Location: [facility] ENDOSCOPY;  Service: Gastroenterology;  Laterality: N/A;  . ENDOBRONCHIAL ULTRASOUND Right 07/30/2018   Procedure: ENDOBRONCHIAL ULTRASOUND;  Surgeon: Laverle Hobby, MD;  Location: [facility] ORS;  Service: Pulmonary;  Laterality: Right;  . ESOPHAGOGASTRODUODENOSCOPY (EGD) WITH PROPOFOL N/A 01/07/2017   Procedure:  ESOPHAGOGASTRODUODENOSCOPY (EGD) WITH PROPOFOL;  Surgeon: Jonathon Bellows, MD;   Location: [facility] ENDOSCOPY;  Service: Gastroenterology;  Laterality: N/A;  . LAPAROSCOPY N/A 03/01/2017   Procedure: LAPAROSCOPY DIAGNOSTIC;  Surgeon: Robert Bellow, MD;  Location: [facility] ORS;  Service: General;  Laterality: N/A;  . PORTA CATH INSERTION N/A 08/14/2018   Procedure: PORTA CATH INSERTION;  Surgeon: Algernon Huxley, MD;  Location: [place] CV LAB;  Service: Cardiovascular;  Laterality: N/A;  . TONSILECTOMY, ADENOIDECTOMY, BILATERAL MYRINGOTOMY [place]  . TONSILLECTOMY    . VENTRAL HERNIA REPAIR N/A 03/01/2017   10 x 14 CM Ventralight ST mesh, intraperitoneal location.   . VENTRAL HERNIA REPAIR N/A 03/01/2017   Procedure: HERNIA REPAIR VENTRAL ADULT;  Surgeon: Robert Bellow, MD;  Location: [facility] ORS;  Service: General;  Laterality: N/A;    Social History   Socioeconomic History  . Marital status: Married    Spouse name: [name]  . Number of children: Not on file  . Years of education: Not on file  . Highest education level: Not on file  Occupational History  . Not on file  Tobacco Use  . Smoking status: Former Smoker    Packs/day: 2.00    Years: 50.00    Pack years: 100.00    Types: Cigarettes, E-cigarettes    Quit date: 02/07/2012    Years since quitting: 8.3  . Smokeless tobacco: Never Used  Vaping Use  . Vaping Use: Some days  . Start date: 10/06/2013  . Devices: uses no liquid  Substance and Sexual Activity  . Alcohol use: No    Alcohol/week: 0.0 standard drinks  . Drug use: No  . Sexual activity: Yes  Other Topics Concern  . Not on file  Social History Narrative   Married   Retired   Clinical cytogeneticist level of education    No children    1 cup of coffee   Social Determinants of Radio broadcast assistant Strain: Low Risk   . Difficulty of Paying Living Expenses: Not hard at all  Food Insecurity: No Food Insecurity  . Worried About Charity fundraiser in the Last Year: Never true  . Ran Out of Food in the Last Year: Never true   Transportation Needs: No Transportation Needs  . Lack of Transportation (Medical): No  . Lack of Transportation (Non-Medical): No  Physical Activity: Not on file  Stress: No Stress Concern Present  . Feeling of Stress : Not at all  Social Connections: Not on file  Intimate Partner Violence: Not At Risk  . Fear of Current or Ex-Partner: No  . Emotionally Abused: No  . Physically Abused: No  . Sexually Abused: No    Family History  Problem Relation Age of Onset  . Hypertension Mother   . Ovarian cancer Mother 92  . Heart disease Father   . Stroke Father   . Ovarian cancer Sister        ? dx cancer had hyst.  . Breast cancer Neg Hx      Current Outpatient Medications:  .  amLODipine (NORVASC) 5 MG tablet, Take 1 tablet (5 mg total) by mouth daily., Disp: 90 tablet, Rfl: 3 .  buPROPion (WELLBUTRIN XL) 300 MG 24 hr tablet, Take 1 tablet by mouth once daily, Disp: 90 tablet, Rfl: 0 .  escitalopram (LEXAPRO) 10 MG tablet, Take 1 tablet by mouth once daily, Disp: 90 tablet, Rfl: 0 .  EUTHYROX 150 MCG tablet, TAKE 1 TABLET BY  MOUTH ONCE DAILY BEFORE BREAKFAST, Disp: 90 tablet, Rfl: 0 .  folic acid (FOLVITE) 1 MG tablet, Take 1 tablet by mouth once daily, Disp: 90 tablet, Rfl: 0 .  ibuprofen (ADVIL,MOTRIN) 200 MG tablet, Take 400 mg by mouth every 8 (eight) hours as needed for headache or moderate pain. , Disp: , Rfl:  .  Multiple Vitamins-Minerals (PRESERVISION AREDS 2) CAPS, Take 1 capsule by mouth 2 (two) times a day. , Disp: , Rfl:  .  rosuvastatin (CRESTOR) 20 MG tablet, Take 1 tablet by mouth once daily, Disp: 90 tablet, Rfl: 0 .  BINAXNOW COVID-19 AG HOME TEST KIT, See admin instructions., Disp: , Rfl:  .  erythromycin ophthalmic ointment, Place 1 application into the right eye daily as needed. (Patient not taking: Reported on 06/10/2020), Disp: , Rfl:  .  fluticasone (FLONASE) 50 MCG/ACT nasal spray, Place 2 sprays into both nostrils daily. (Patient not taking: Reported on  06/10/2020), Disp: 16 g, Rfl: 6 .  lidocaine-prilocaine (EMLA) cream, Apply to affected area once (Patient not taking: No sig reported), Disp: 30 g, Rfl: 3 .  loperamide (IMODIUM) 2 MG capsule, Take 1 capsule (2 mg total) by mouth every 2 (two) hours as needed for diarrhea or loose stools. Do not exceed 17m daily. (Patient not taking: No sig reported), Disp: 30 capsule, Rfl: 1 .  ondansetron (ZOFRAN ODT) 4 MG disintegrating tablet, Take 1 tablet (4 mg total) by mouth every 8 (eight) hours as needed for nausea or vomiting. (Patient not taking: No sig reported), Disp: 20 tablet, Rfl: 0 .  pantoprazole (PROTONIX) 40 MG tablet, TAKE 1 TABLET BY MOUTH TWICE DAILY BEFORE A MEAL (Patient not taking: Reported on 06/10/2020), Disp: 180 tablet, Rfl: 0 .  polyethylene glycol (MIRALAX / GLYCOLAX) 17 g packet, Take 17 g by mouth daily as needed for mild constipation. (Patient not taking: Reported on 06/10/2020), Disp: 14 each, Rfl: 0 No current facility-administered medications for this visit.  Facility-Administered Medications Ordered in Other Visits:  .  denosumab (XGEVA) injection 120 mg, 120 mg, Subcutaneous, Q30 days, RSindy Guadeloupe MD, 120 mg at 03/05/19 1525  Physical exam:  Vitals:   06/10/20 0910  BP: 128/83  Pulse: 79  Resp: 16  Temp: (!) 96.3 F (35.7 C)  SpO2: 95%  Weight: 168 lb 14.4 oz (76.6 kg)   Physical Exam Constitutional:      General: She is not in acute distress. Cardiovascular:     Rate and Rhythm: Normal rate and regular rhythm.     Heart sounds: Normal heart sounds.  Pulmonary:     Effort: Pulmonary effort is normal.     Breath sounds: Normal breath sounds.  Abdominal:     General: Bowel sounds are normal.     Palpations: Abdomen is soft.  Skin:    General: Skin is warm and dry.  Neurological:     Mental Status: She is alert and oriented to person, place, and time.      CMP Latest Ref Rng & Units 06/10/2020  Glucose 70 - 99 mg/dL 89  BUN 8 - 23 mg/dL 20  Creatinine  0.44 - 1.00 mg/dL 0.92  Sodium 135 - 145 mmol/L 137  Potassium 3.5 - 5.1 mmol/L 3.8  Chloride 98 - 111 mmol/L 99  CO2 22 - 32 mmol/L 27  Calcium 8.9 - 10.3 mg/dL 9.2  Total Protein 6.5 - 8.1 g/dL 7.6  Total Bilirubin 0.3 - 1.2 mg/dL 0.8  Alkaline Phos 38 - 126 U/L 122  AST 15 - 41 U/L 25  ALT 0 - 44 U/L 16   CBC Latest Ref Rng & Units 06/10/2020  WBC 4.0 - 10.5 K/uL 4.4  Hemoglobin 12.0 - 15.0 g/dL 12.4  Hematocrit 36.0 - 46.0 % 38.4  Platelets 150 - 400 K/uL 160     Assessment and plan- Patient is a 70 y.o. 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  I will see her back in 3 weeks for cycle 30.  Autoimmune hypothyroidism: Patient maintains that she is compliant with her dose of levothyroxine.  Her TSH was Low on 2 occasions in the past antedates higher 13.  I would like to repeat it again in 6 weeks before changing her dose.  Free T4 is normal.  She is currently on 150 mcg of levothyroxine Visit Diagnosis 1. Encounter for antineoplastic immunotherapy   2. Small cell lung cancer, right upper lobe ([place])      Dr. Randa Evens, MD, MPH [facility] [phone] 06/10/2020 2:52 PM

## 2020-06-10 NOTE — Progress Notes (Signed)
Pt received tecentriq infusion in clinic today. Tolerated well.

## 2020-06-10 NOTE — Patient Instructions (Signed)
[facility]COL[facility]    Discharge Instructions: Thank you for choosing [facility] to provide your oncology and hematology care.  If you have a lab appointment with the [facility], please go directly to the [facility] and check in at the registration area.  Wear comfortable clothing and clothing appropriate for easy access to any Portacath or PICC line.   We strive to give you quality time with your provider. You may need to reschedule your appointment if you arrive late (15 or more minutes).  Arriving late affects you and other patients whose appointments are after yours.  Also, if you miss three or more appointments without notifying the office, you may be dismissed from the clinic at the provider's discretion.      For prescription refill requests, have your pharmacy contact our office and allow 72 hours for refills to be completed.    Today you received the following chemotherapy and/or immunotherapy agents - tecentriq    To help prevent nausea and vomiting after your treatment, we encourage you to take your nausea medication as directed.  BELOW ARE SYMPTOMS THAT SHOULD BE REPORTED IMMEDIATELY: . *FEVER GREATER THAN 100.4 F (38 C) OR HIGHER . *CHILLS OR SWEATING . *NAUSEA AND VOMITING THAT IS NOT CONTROLLED WITH YOUR NAUSEA MEDICATION . *UNUSUAL SHORTNESS OF BREATH . *UNUSUAL BRUISING OR BLEEDING . *URINARY PROBLEMS (pain or burning when urinating, or frequent urination) . *BOWEL PROBLEMS (unusual diarrhea, constipation, pain near the anus) . TENDERNESS IN MOUTH AND THROAT WITH OR WITHOUT PRESENCE OF ULCERS (sore throat, sores in mouth, or a toothache) . UNUSUAL RASH, SWELLING OR PAIN  . UNUSUAL VAGINAL DISCHARGE OR ITCHING   Items with * indicate a potential emergency and should be followed up as soon as possible or go to the Emergency Department if any problems should occur.  Please show the CHEMOTHERAPY ALERT CARD or IMMUNOTHERAPY  ALERT CARD at check-in to the Emergency Department and triage nurse.  Should you have questions after your visit or need to cancel or reschedule your appointment, please contact [facility]  [phone] and follow the prompts.  Office hours are 8:00 a.m. to 4:30 p.m. Monday - Friday. Please note that voicemails left after 4:00 p.m. may not be returned until the following business day.  We are closed weekends and major holidays. You have access to a nurse at all times for urgent questions. Please call the main number to the clinic [phone] and follow the prompts.  For any non-urgent questions, you may also contact your provider using MyChart. We now offer e-Visits for anyone 57 and older to request care online for non-urgent symptoms. For details visit [email or web address].   Also download the MyChart app! Go to the app store, search "MyChart", open the app, select [facility], and log in with your MyChart username and password.  Due to Covid, a mask is required upon entering the hospital/clinic. If you do not have a mask, one will be given to you upon arrival. For doctor visits, patients may have 1 support person aged 69 or older with them. For treatment visits, patients cannot have anyone with them due to current Covid guidelines and our immunocompromised population.   Atezolizumab injection What is this medicine? ATEZOLIZUMAB (a te zoe LIZ ue mab) is a monoclonal antibody. It is used to treat bladder cancer (urothelial cancer), liver cancer, lung cancer, and melanoma. This medicine may be used for other purposes; ask your health care  provider or pharmacist if you have questions. COMMON BRAND NAME(S): Tecentriq What should I tell my health care provider before I take this medicine? They need to know if you have any of these conditions:  autoimmune diseases like Crohn's disease, ulcerative colitis, or lupus  have had or planning to have an allogeneic  stem cell transplant (uses someone else's stem cells)  history of organ transplant  history of radiation to the chest  nervous system problems like myasthenia gravis or Guillain-Barre syndrome  an unusual or allergic reaction to atezolizumab, other medicines, foods, dyes, or preservatives  pregnant or trying to get pregnant  breast-feeding How should I use this medicine? This medicine is for infusion into a vein. It is given by a health care professional in a hospital or clinic setting. A special MedGuide will be given to you before each treatment. Be sure to read this information carefully each time. Talk to your pediatrician regarding the use of this medicine in children. Special care may be needed. Overdosage: If you think you have taken too much of this medicine contact a poison control center or emergency room at once. NOTE: This medicine is only for you. Do not share this medicine with others. What if I miss a dose? It is important not to miss your dose. Call your doctor or health care professional if you are unable to keep an appointment. What may interact with this medicine? Interactions have not been studied. This list may not describe all possible interactions. Give your health care provider a list of all the medicines, herbs, non-prescription drugs, or dietary supplements you use. Also tell them if you smoke, drink alcohol, or use illegal drugs. Some items may interact with your medicine. What should I watch for while using this medicine? Your condition will be monitored carefully while you are receiving this medicine. You may need blood work done while you are taking this medicine. Do not become pregnant while taking this medicine or for at least 5 months after stopping it. Women should inform their doctor if they wish to become pregnant or think they might be pregnant. There is a potential for serious side effects to an unborn child. Talk to your health care professional or  pharmacist for more information. Do not breast-feed an infant while taking this medicine or for at least 5 months after the last dose. What side effects may I notice from receiving this medicine? Side effects that you should report to your doctor or health care professional as soon as possible:  allergic reactions like skin rash, itching or hives, swelling of the face, lips, or tongue  black, tarry stools  bloody or watery diarrhea  breathing problems  changes in vision  chest pain or chest tightness  chills  facial flushing  fever  headache  signs and symptoms of high blood sugar such as dizziness; dry mouth; dry skin; fruity breath; nausea; stomach pain; increased hunger or thirst; increased urination  signs and symptoms of liver injury like dark yellow or brown urine; general ill feeling or flu-like symptoms; light-colored stools; loss of appetite; nausea; right upper belly pain; unusually weak or tired; yellowing of the eyes or skin  stomach pain  trouble passing urine or change in the amount of urine Side effects that usually do not require medical attention (report to your doctor or health care professional if they continue or are bothersome):  bone pain  cough  diarrhea  joint pain  muscle pain  muscle weakness  swelling of  arms or legs  tiredness  weight loss This list may not describe all possible side effects. Call your doctor for medical advice about side effects. You may report side effects to FDA at 1-800-FDA-1088. Where should I keep my medicine? This drug is given in a hospital or clinic and will not be stored at home. NOTE: This sheet is a summary. It may not cover all possible information. If you have questions about this medicine, talk to your doctor, pharmacist, or health care provider.  2021 Elsevier/Gold Standard (2019-10-22 13:59:34)

## 2020-06-14 ENCOUNTER — Ambulatory Visit: Payer: PPO | Admitting: Family Medicine

## 2020-06-16 ENCOUNTER — Other Ambulatory Visit: Payer: Self-pay | Admitting: Family Medicine

## 2020-07-01 ENCOUNTER — Inpatient Hospital Stay: Payer: PPO

## 2020-07-01 ENCOUNTER — Inpatient Hospital Stay (HOSPITAL_BASED_OUTPATIENT_CLINIC_OR_DEPARTMENT_OTHER): Payer: PPO | Admitting: Oncology

## 2020-07-01 ENCOUNTER — Encounter: Payer: Self-pay | Admitting: Oncology

## 2020-07-01 VITALS — BP 121/76 | HR 69 | Temp 96.7°F | Resp 20 | Wt 170.6 lb

## 2020-07-01 DIAGNOSIS — C3411 Malignant neoplasm of upper lobe, right bronchus or lung: Secondary | ICD-10-CM | POA: Diagnosis not present

## 2020-07-01 DIAGNOSIS — E063 Autoimmune thyroiditis: Secondary | ICD-10-CM

## 2020-07-01 DIAGNOSIS — Z95828 Presence of other vascular implants and grafts: Secondary | ICD-10-CM

## 2020-07-01 DIAGNOSIS — Z5112 Encounter for antineoplastic immunotherapy: Secondary | ICD-10-CM

## 2020-07-01 LAB — COMPREHENSIVE METABOLIC PANEL WITH GFR
ALT: 16 U/L (ref 0–44)
AST: 20 U/L (ref 15–41)
Albumin: 3.8 g/dL (ref 3.5–5.0)
Alkaline Phosphatase: 119 U/L (ref 38–126)
Anion gap: 11 (ref 5–15)
BUN: 19 mg/dL (ref 8–23)
CO2: 29 mmol/L (ref 22–32)
Calcium: 9.2 mg/dL (ref 8.9–10.3)
Chloride: 100 mmol/L (ref 98–111)
Creatinine, Ser: 0.92 mg/dL (ref 0.44–1.00)
GFR, Estimated: >60 mL/min
Glucose, Bld: 92 mg/dL (ref 70–99)
Potassium: 3.9 mmol/L (ref 3.5–5.1)
Sodium: 140 mmol/L (ref 135–145)
Total Bilirubin: 0.5 mg/dL (ref 0.3–1.2)
Total Protein: 7.5 g/dL (ref 6.5–8.1)

## 2020-07-01 LAB — CBC WITH DIFFERENTIAL/PLATELET
Abs Immature Granulocytes: 0.01 K/uL (ref 0.00–0.07)
Basophils Absolute: 0.1 K/uL (ref 0.0–0.1)
Basophils Relative: 2 %
Eosinophils Absolute: 0.1 K/uL (ref 0.0–0.5)
Eosinophils Relative: 1 %
HCT: 38.3 % (ref 36.0–46.0)
Hemoglobin: 11.9 g/dL — ABNORMAL LOW (ref 12.0–15.0)
Immature Granulocytes: 0 %
Lymphocytes Relative: 23 %
Lymphs Abs: 0.9 K/uL (ref 0.7–4.0)
MCH: 27.3 pg (ref 26.0–34.0)
MCHC: 31.1 g/dL (ref 30.0–36.0)
MCV: 87.8 fL (ref 80.0–100.0)
Monocytes Absolute: 0.3 K/uL (ref 0.1–1.0)
Monocytes Relative: 8 %
Neutro Abs: 2.6 K/uL (ref 1.7–7.7)
Neutrophils Relative %: 66 %
Platelets: 153 K/uL (ref 150–400)
RBC: 4.36 MIL/uL (ref 3.87–5.11)
RDW: 16 % — ABNORMAL HIGH (ref 11.5–15.5)
WBC: 3.9 K/uL — ABNORMAL LOW (ref 4.0–10.5)
nRBC: 0 % (ref 0.0–0.2)

## 2020-07-01 MED ORDER — SODIUM CHLORIDE 0.9 % IV SOLN
Freq: Once | INTRAVENOUS | Status: AC
  Filled 2020-07-01: qty 250

## 2020-07-01 MED ORDER — HEPARIN SOD (PORK) LOCK FLUSH 100 UNIT/ML IV SOLN
500.0000 [IU] | Freq: Once | INTRAVENOUS | Status: AC | PRN
  Administered 2020-07-01: 500 [IU]
  Filled 2020-07-01: qty 5

## 2020-07-01 MED ORDER — SODIUM CHLORIDE 0.9 % IV SOLN
1200.0000 mg | Freq: Once | INTRAVENOUS | Status: AC
  Administered 2020-07-01: 1200 mg via INTRAVENOUS
  Filled 2020-07-01: qty 20

## 2020-07-01 MED ORDER — SODIUM CHLORIDE 0.9% FLUSH
10.0000 mL | Freq: Once | INTRAVENOUS | Status: DC
  Filled 2020-07-01: qty 10

## 2020-07-01 MED ORDER — HEPARIN SOD (PORK) LOCK FLUSH 100 UNIT/ML IV SOLN
500.0000 [IU] | Freq: Once | INTRAVENOUS | Status: DC
  Filled 2020-07-01: qty 5

## 2020-07-01 MED ORDER — HEPARIN SOD (PORK) LOCK FLUSH 100 UNIT/ML IV SOLN
INTRAVENOUS | Status: AC
  Filled 2020-07-01: qty 5

## 2020-07-01 NOTE — Patient Instructions (Signed)
[facility]COL[facility]  Discharge Instructions: Thank you for choosing [facility] to provide your oncology and hematology care.  If you have a lab appointment with the [facility], please go directly to the [facility] and check in at the registration area.  Wear comfortable clothing and clothing appropriate for easy access to any Portacath or PICC line.   We strive to give you quality time with your provider. You may need to reschedule your appointment if you arrive late (15 or more minutes).  Arriving late affects you and other patients whose appointments are after yours.  Also, if you miss three or more appointments without notifying the office, you may be dismissed from the clinic at the provider's discretion.      For prescription refill requests, have your pharmacy contact our office and allow 72 hours for refills to be completed.    Today you received the following chemotherapy and/or immunotherapy agents Tecentriq      To help prevent nausea and vomiting after your treatment, we encourage you to take your nausea medication as directed.  BELOW ARE SYMPTOMS THAT SHOULD BE REPORTED IMMEDIATELY: . *FEVER GREATER THAN 100.4 F (38 C) OR HIGHER . *CHILLS OR SWEATING . *NAUSEA AND VOMITING THAT IS NOT CONTROLLED WITH YOUR NAUSEA MEDICATION . *UNUSUAL SHORTNESS OF BREATH . *UNUSUAL BRUISING OR BLEEDING . *URINARY PROBLEMS (pain or burning when urinating, or frequent urination) . *BOWEL PROBLEMS (unusual diarrhea, constipation, pain near the anus) . TENDERNESS IN MOUTH AND THROAT WITH OR WITHOUT PRESENCE OF ULCERS (sore throat, sores in mouth, or a toothache) . UNUSUAL RASH, SWELLING OR PAIN  . UNUSUAL VAGINAL DISCHARGE OR ITCHING   Items with * indicate a potential emergency and should be followed up as soon as possible or go to the Emergency Department if any problems should occur.  Please show the CHEMOTHERAPY ALERT CARD or IMMUNOTHERAPY  ALERT CARD at check-in to the Emergency Department and triage nurse.  Should you have questions after your visit or need to cancel or reschedule your appointment, please contact [facility]  [phone] and follow the prompts.  Office hours are 8:00 a.m. to 4:30 p.m. Monday - Friday. Please note that voicemails left after 4:00 p.m. may not be returned until the following business day.  We are closed weekends and major holidays. You have access to a nurse at all times for urgent questions. Please call the main number to the clinic [phone] and follow the prompts.  For any non-urgent questions, you may also contact your provider using MyChart. We now offer e-Visits for anyone 35 and older to request care online for non-urgent symptoms. For details visit [email or web address].   Also download the MyChart app! Go to the app store, search "MyChart", open the app, select [facility], and log in with your MyChart username and password.  Due to Covid, a mask is required upon entering the hospital/clinic. If you do not have a mask, one will be given to you upon arrival. For doctor visits, patients may have 1 support person aged 72 or older with them. For treatment visits, patients cannot have anyone with them due to current Covid guidelines and our immunocompromised population.

## 2020-07-01 NOTE — Progress Notes (Signed)
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 She has been referred to [place] for findings of bone metastases and her recent MRI. She has a prior history of 3 packs/day day smoking for over 45 years and quit smoking 5 years ago.She had a CT chest abdomen pelvis in 2018 which showed a 5 mm lung nodule in the left lower lobe. Recently over the last 2 months patient has been having worsening back pain and was referred to orthopedics. 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 Pathologic fracture of S1.  PET scan showed 2 RUL lung nodules, hilar and mediastinal adenopathy and widespread bone mets. MRI brain negative.  Patientcompletedpalliative RT to her spine. Bronchoscopy showed small cell lung cancer. Palliative carboplatin, etoposide and Tecentriq started on 08/18/2018. Scans after 4 cycles showed stable disease. She is on maintenance tecentriq  Interval history-patient is doing well and denies any specific complaints at this time.  Appetite and weight have been stable.  She reports taking levothyroxine every day but often  combines it with other medications  ECOG PS- 1 Pain scale- 0   Review of systems- Review of Systems  Constitutional: Negative for chills, fever, malaise/fatigue and weight loss.  HENT: Negative for congestion, ear discharge and nosebleeds.   Eyes: Negative for blurred vision.  Respiratory: Negative for cough, hemoptysis, sputum production, shortness of breath and wheezing.   Cardiovascular: Negative for chest pain, palpitations, orthopnea and claudication.  Gastrointestinal: Negative for abdominal pain, blood in stool, constipation, diarrhea, heartburn, melena, nausea and vomiting.  Genitourinary: Negative for dysuria, flank pain, frequency, hematuria and urgency.  Musculoskeletal: Negative for back pain, joint pain and myalgias.  Skin: Negative for rash.  Neurological: Negative for dizziness, tingling, focal weakness, seizures, weakness and headaches.  Endo/Heme/Allergies: Does not bruise/bleed easily.  Psychiatric/Behavioral: Negative for depression and suicidal ideas. The patient does not have insomnia.        Allergies  Allergen Reactions  . Bee Venom Swelling     Past Medical History:  Diagnosis Date  . Arthritis   . Cirrhosis ([place])   . Depression   . GERD (gastroesophageal reflux disease)   . Hyperlipidemia   . Hypertension   . Lung cancer ([place])   . Metastatic bone cancer [facility])      Past Surgical History:  Procedure Laterality Date  . APPENDECTOMY  1971  . CHOLECYSTECTOMY  1971  . COLONOSCOPY WITH PROPOFOL N/A 11/15/2016   Procedure: COLONOSCOPY WITH PROPOFOL;  Surgeon: Jonathon Bellows, MD;  Location: [facility] ENDOSCOPY;  Service: Gastroenterology;  Laterality: N/A;  . ENDOBRONCHIAL ULTRASOUND Right 07/30/2018   Procedure: ENDOBRONCHIAL ULTRASOUND;  Surgeon: Laverle Hobby, MD;  Location: [facility] ORS;  Service: Pulmonary;  Laterality: Right;  . ESOPHAGOGASTRODUODENOSCOPY (  EGD) WITH PROPOFOL N/A 01/07/2017   Procedure: ESOPHAGOGASTRODUODENOSCOPY (EGD) WITH PROPOFOL;   Surgeon: Jonathon Bellows, MD;  Location: [facility] ENDOSCOPY;  Service: Gastroenterology;  Laterality: N/A;  . LAPAROSCOPY N/A 03/01/2017   Procedure: LAPAROSCOPY DIAGNOSTIC;  Surgeon: Robert Bellow, MD;  Location: [facility] ORS;  Service: General;  Laterality: N/A;  . PORTA CATH INSERTION N/A 08/14/2018   Procedure: PORTA CATH INSERTION;  Surgeon: Algernon Huxley, MD;  Location: [facility];  Service: Cardiovascular;  Laterality: N/A;  . TONSILECTOMY, ADENOIDECTOMY, BILATERAL MYRINGOTOMY [place]  . TONSILLECTOMY    . VENTRAL HERNIA REPAIR N/A 03/01/2017   10 x 14 CM Ventralight ST mesh, intraperitoneal location.   . VENTRAL HERNIA REPAIR N/A 03/01/2017   Procedure: HERNIA REPAIR VENTRAL ADULT;  Surgeon: Robert Bellow, MD;  Location: [facility] ORS;  Service: General;  Laterality: N/A;    Social History   Socioeconomic History  . Marital status: Married    Spouse name: [name]  . Number of children: Not on file  . Years of education: Not on file  . Highest education level: Not on file  Occupational History  . Not on file  Tobacco Use  . Smoking status: Former Smoker    Packs/day: 2.00    Years: 50.00    Pack years: 100.00    Types: Cigarettes, E-cigarettes    Quit date: 02/07/2012    Years since quitting: 8.4  . Smokeless tobacco: Never Used  Vaping Use  . Vaping Use: Some days  . Start date: 10/06/2013  . Devices: uses no liquid  Substance and Sexual Activity  . Alcohol use: No    Alcohol/week: 0.0 standard drinks  . Drug use: No  . Sexual activity: Yes  Other Topics Concern  . Not on file  Social History Narrative   Married   Retired   Clinical cytogeneticist level of education    No children    1 cup of coffee   Social Determinants of Radio broadcast assistant Strain: Low Risk   . Difficulty of Paying Living Expenses: Not hard at all  Food Insecurity: No Food Insecurity  . Worried About Charity fundraiser in the Last Year: Never true  . Ran Out of Food in the Last  Year: Never true  Transportation Needs: No Transportation Needs  . Lack of Transportation (Medical): No  . Lack of Transportation (Non-Medical): No  Physical Activity: Not on file  Stress: No Stress Concern Present  . Feeling of Stress : Not at all  Social Connections: Not on file  Intimate Partner Violence: Not At Risk  . Fear of Current or Ex-Partner: No  . Emotionally Abused: No  . Physically Abused: No  . Sexually Abused: No    Family History  Problem Relation Age of Onset  . Hypertension Mother   . Ovarian cancer Mother 64  . Heart disease Father   . Stroke Father   . Ovarian cancer Sister        ? dx cancer had hyst.  . Breast cancer Neg Hx      Current Outpatient Medications:  .  amLODipine (NORVASC) 5 MG tablet, Take 1 tablet (5 mg total) by mouth daily., Disp: 90 tablet, Rfl: 3 .  BINAXNOW COVID-19 AG HOME TEST KIT, See admin instructions., Disp: , Rfl:  .  buPROPion (WELLBUTRIN XL) 300 MG 24 hr tablet, Take 1 tablet by mouth once daily, Disp: 90 tablet, Rfl: 0 .  erythromycin ophthalmic ointment, Place  1 application into the right eye daily as needed. (Patient not taking: Reported on 06/10/2020), Disp: , Rfl:  .  escitalopram (LEXAPRO) 10 MG tablet, Take 1 tablet by mouth once daily, Disp: 90 tablet, Rfl: 0 .  EUTHYROX 150 MCG tablet, TAKE 1 TABLET BY MOUTH ONCE DAILY BEFORE BREAKFAST, Disp: 90 tablet, Rfl: 0 .  fluticasone (FLONASE) 50 MCG/ACT nasal spray, Place 2 sprays into both nostrils daily. (Patient not taking: Reported on 06/10/2020), Disp: 16 g, Rfl: 6 .  folic acid (FOLVITE) 1 MG tablet, Take 1 tablet by mouth once daily, Disp: 90 tablet, Rfl: 0 .  ibuprofen (ADVIL,MOTRIN) 200 MG tablet, Take 400 mg by mouth every 8 (eight) hours as needed for headache or moderate pain. , Disp: , Rfl:  .  lidocaine-prilocaine (EMLA) cream, Apply to affected area once (Patient not taking: No sig reported), Disp: 30 g, Rfl: 3 .  loperamide (IMODIUM) 2 MG capsule, Take 1 capsule (2  mg total) by mouth every 2 (two) hours as needed for diarrhea or loose stools. Do not exceed 54m daily. (Patient not taking: No sig reported), Disp: 30 capsule, Rfl: 1 .  Multiple Vitamins-Minerals (PRESERVISION AREDS 2) CAPS, Take 1 capsule by mouth 2 (two) times a day. , Disp: , Rfl:  .  ondansetron (ZOFRAN ODT) 4 MG disintegrating tablet, Take 1 tablet (4 mg total) by mouth every 8 (eight) hours as needed for nausea or vomiting. (Patient not taking: No sig reported), Disp: 20 tablet, Rfl: 0 .  pantoprazole (PROTONIX) 40 MG tablet, TAKE 1 TABLET BY MOUTH TWICE DAILY BEFORE A MEAL, Disp: 84 tablet, Rfl: 0 .  polyethylene glycol (MIRALAX / GLYCOLAX) 17 g packet, Take 17 g by mouth daily as needed for mild constipation. (Patient not taking: Reported on 06/10/2020), Disp: 14 each, Rfl: 0 .  rosuvastatin (CRESTOR) 20 MG tablet, Take 1 tablet by mouth once daily, Disp: 90 tablet, Rfl: 0 No current facility-administered medications for this visit.  Facility-Administered Medications Ordered in Other Visits:  .  denosumab (XGEVA) injection 120 mg, 120 mg, Subcutaneous, Q30 days, RSindy Guadeloupe MD, 120 mg at 03/05/19 1525  Physical exam:  Vitals:   07/01/20 0947  BP: 121/76  Pulse: 69  Resp: 20  Temp: (!) 96.7 F (35.9 C)  TempSrc: Tympanic  SpO2: 94%  Weight: 170 lb 9.6 oz (77.4 kg)   Physical Exam Constitutional:      General: She is not in acute distress. Cardiovascular:     Rate and Rhythm: Normal rate and regular rhythm.     Heart sounds: Normal heart sounds.  Pulmonary:     Effort: Pulmonary effort is normal.     Breath sounds: Normal breath sounds.  Abdominal:     General: Bowel sounds are normal.     Palpations: Abdomen is soft.  Skin:    General: Skin is warm and dry.  Neurological:     Mental Status: She is alert and oriented to person, place, and time.      CMP Latest Ref Rng & Units 07/01/2020  Glucose 70 - 99 mg/dL 92  BUN 8 - 23 mg/dL 19  Creatinine 0.44 - 1.00 mg/dL  0.92  Sodium 135 - 145 mmol/L 140  Potassium 3.5 - 5.1 mmol/L 3.9  Chloride 98 - 111 mmol/L 100  CO2 22 - 32 mmol/L 29  Calcium 8.9 - 10.3 mg/dL 9.2  Total Protein 6.5 - 8.1 g/dL 7.5  Total Bilirubin 0.3 - 1.2 mg/dL 0.5  Alkaline Phos  38 - 126 U/L 119  AST 15 - 41 U/L 20  ALT 0 - 44 U/L 16   CBC Latest Ref Rng & Units 07/01/2020  WBC 4.0 - 10.5 K/uL 3.9(L)  Hemoglobin 12.0 - 15.0 g/dL 11.9(L)  Hematocrit 36.0 - 46.0 % 38.3  Platelets 150 - 400 K/uL 153      Assessment and plan- Patient is a 70 y.o. 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  I will see her back in 3 weeks for cycle 31 and plan to repeat scans following that  Autoimmune hypothyroidism: Repeat TSH and free T4 in 3 weeks.  She is currently on 150 mcg of levothyroxine   Visit Diagnosis 1. Encounter for antineoplastic immunotherapy   2. Autoimmune hypothyroidism   3. Small cell lung cancer, right upper lobe ([place])      Dr. Randa Evens, MD, MPH [facility] [phone] 07/01/2020 1:32 PM

## 2020-07-01 NOTE — Telephone Encounter (Signed)
I called the daughter Albina Billet the daughter of the pt. I had met the pt. On her way out from chemo. And she was saying that someone told her that she had breast cancer. I told her that she does not and it is lung cancer like she has always had since she has been going here at cancer center. Then she asked how long she is going to live. I told her that would be something I would have to ask Janese Banks and she said that how long do I keep getting treatment. I told her that we usually do it until it does not work to keep the cancer from spreading. I told her that she was one of the pt's that seems like she is doing good, drives herself, not having any problems and gets along very well. Then she seemed ok and went home . Albina Billet will check on her this evening

## 2020-07-02 ENCOUNTER — Other Ambulatory Visit: Payer: Self-pay | Admitting: Family Medicine

## 2020-07-02 ENCOUNTER — Other Ambulatory Visit: Payer: Self-pay | Admitting: Oncology

## 2020-07-09 IMAGING — DX DG HIP (WITH OR WITHOUT PELVIS) 2-3V RIGHT
3 series · 3 of 3 positions shown · non-contrast
Comparison: None

Correlation: CT chest abdomen and pelvis 01/02/2017

CLINICAL DATA: RIGHT hip pain

EXAM:
DG HIP (WITH OR WITHOUT PELVIS) 2-3V RIGHT

[pelvis ap]
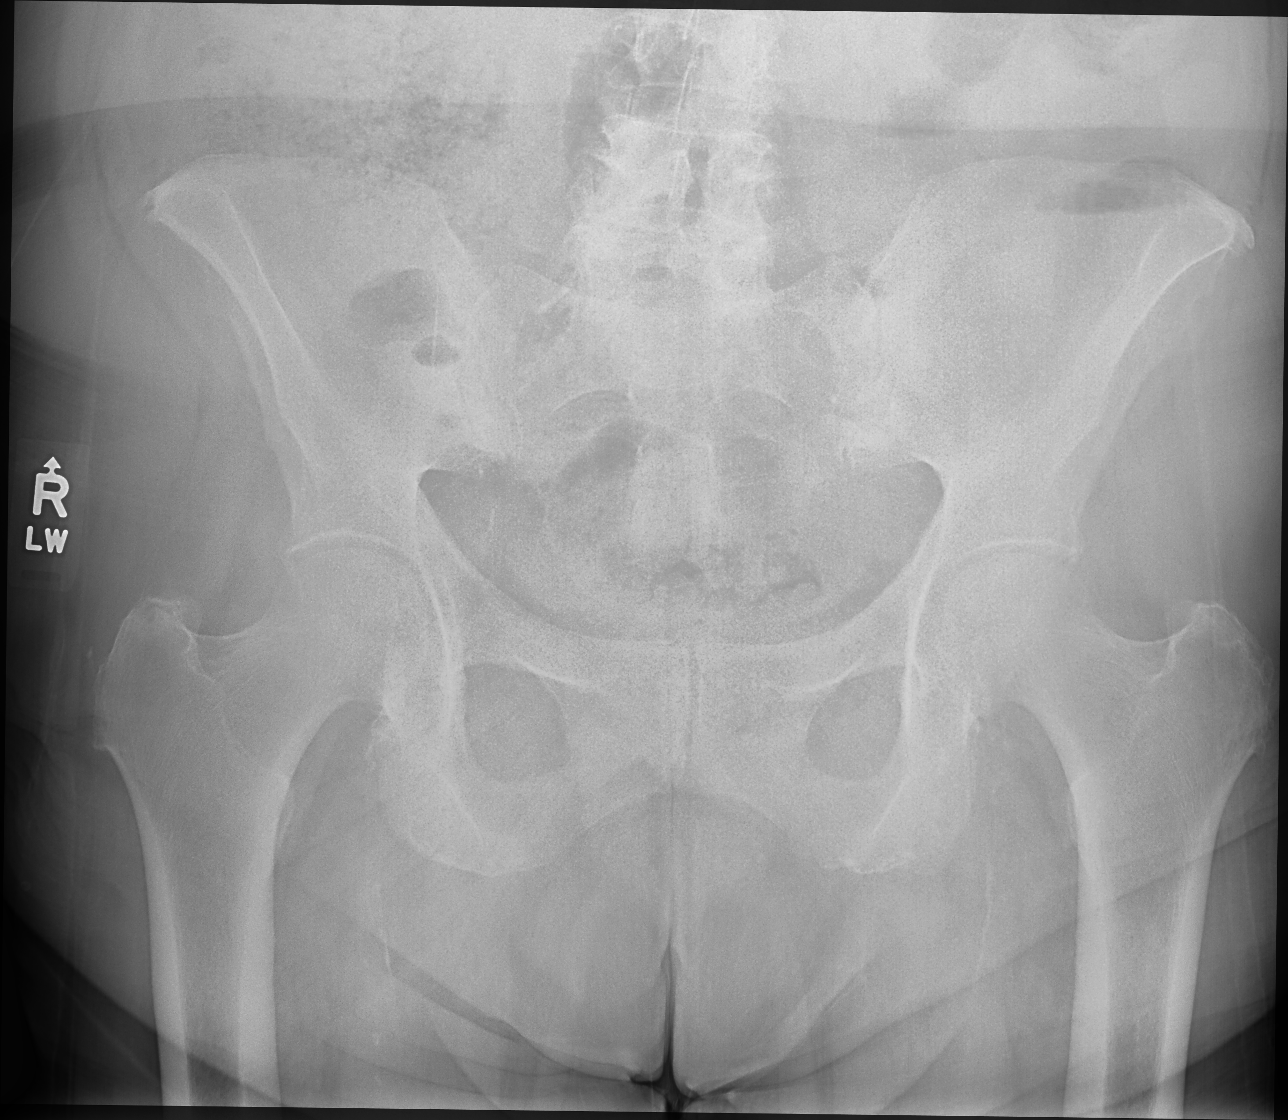

[hip joint ap]
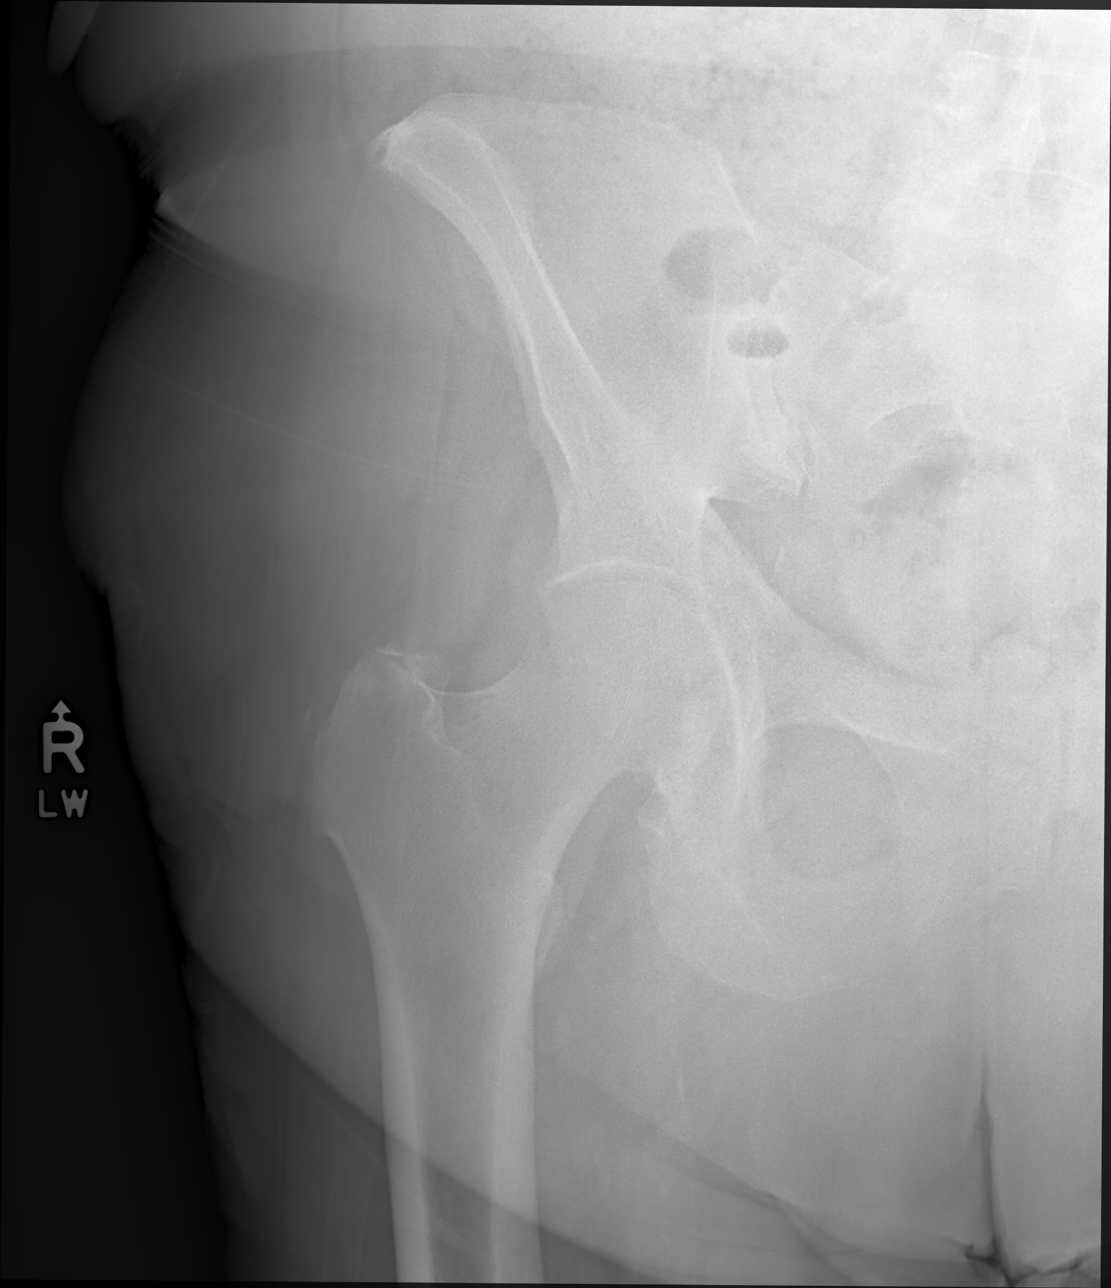

[ap frog obl (oblique)]
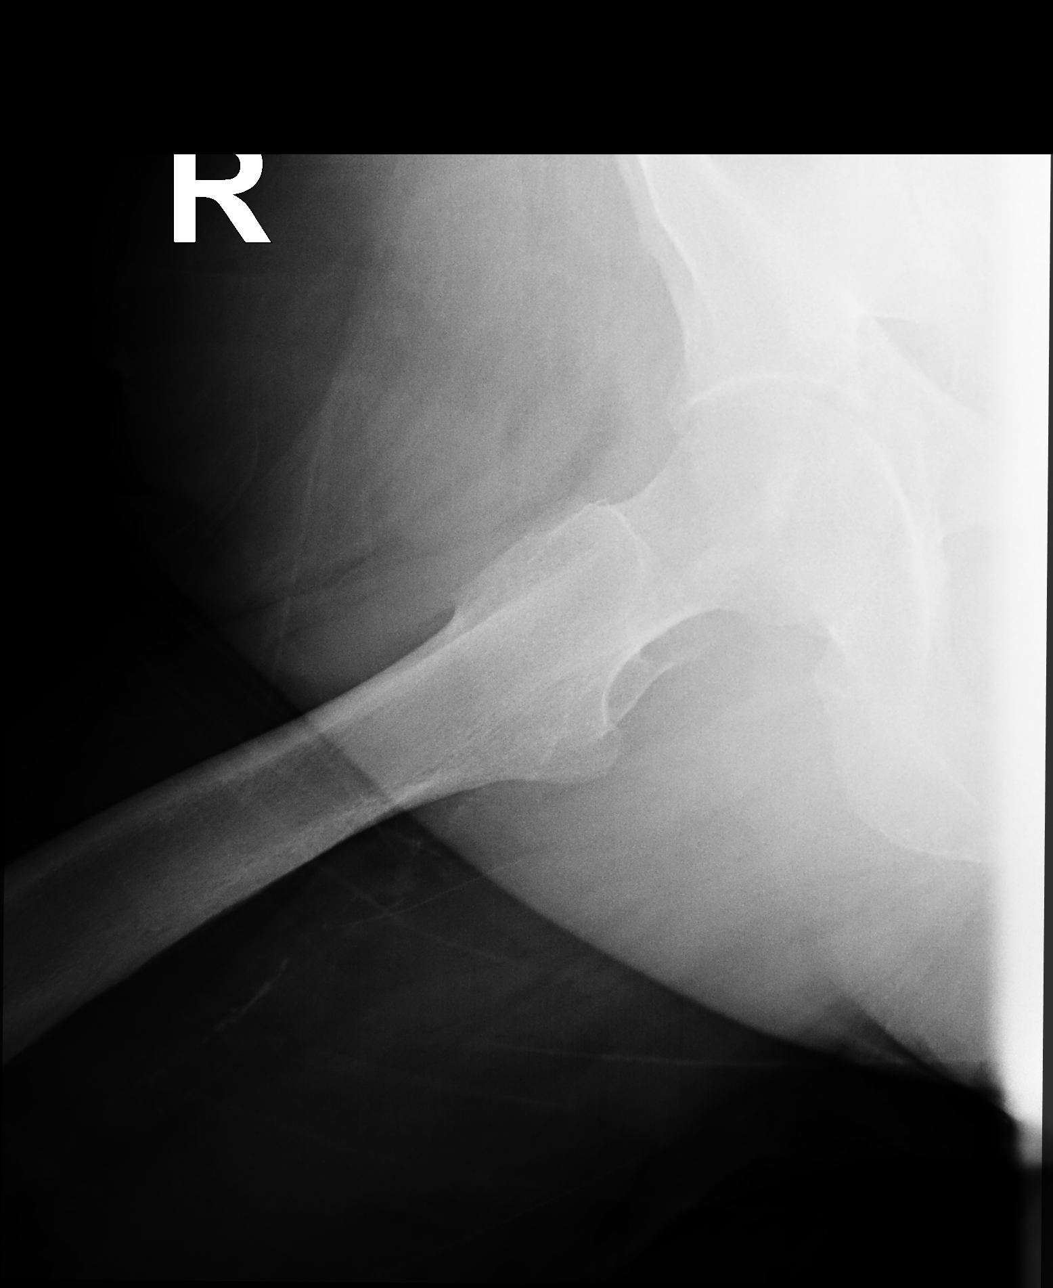

[3 of 3 positions shown; findings below may reference images not displayed]

FINDINGS: Osseous mineralization normal.

Hip joint spaces preserved.

Minimal sclerosis adjacent to the SI joints bilaterally, unchanged
from prior CT, SI joints preserved.

No fracture, dislocation, or bone destruction.
IMPRESSION: No acute RIGHT hip abnormalities.

## 2020-07-14 ENCOUNTER — Telehealth: Payer: Self-pay | Admitting: Family Medicine

## 2020-07-14 NOTE — Telephone Encounter (Signed)
Noted  

## 2020-07-14 NOTE — Telephone Encounter (Signed)
PT called to schedule a apt with Dr.Sonnenberg for a rash that broke out across their back. Got them schedule with Dr.TMS since Dr.Sonnenberg had nothing. Its for 9:00 tomorrow.

## 2020-07-15 ENCOUNTER — Ambulatory Visit (INDEPENDENT_AMBULATORY_CARE_PROVIDER_SITE_OTHER): Payer: PPO | Admitting: Internal Medicine

## 2020-07-15 ENCOUNTER — Other Ambulatory Visit: Payer: Self-pay

## 2020-07-15 ENCOUNTER — Encounter: Payer: Self-pay | Admitting: Internal Medicine

## 2020-07-15 VITALS — BP 110/62 | HR 84 | Temp 97.7°F | Ht 66.0 in | Wt 166.0 lb

## 2020-07-15 DIAGNOSIS — L309 Dermatitis, unspecified: Secondary | ICD-10-CM

## 2020-07-15 DIAGNOSIS — E032 Hypothyroidism due to medicaments and other exogenous substances: Secondary | ICD-10-CM

## 2020-07-15 DIAGNOSIS — B029 Zoster without complications: Secondary | ICD-10-CM

## 2020-07-15 MED ORDER — VALACYCLOVIR HCL 1 G PO TABS
1000.0000 mg | ORAL_TABLET | Freq: Two times a day (BID) | ORAL | 0 refills | Status: DC
Start: 2020-07-15 — End: 2020-07-15

## 2020-07-15 MED ORDER — VALACYCLOVIR HCL 1 G PO TABS: 1000.0000 mg | ORAL_TABLET | Freq: Three times a day (TID) | ORAL | 0 refills | Status: DC

## 2020-07-15 MED ORDER — CLOBETASOL PROPIONATE 0.05 % EX CREA: 1.0000 "application " | TOPICAL_CREAM | Freq: Two times a day (BID) | CUTANEOUS | 1 refills | Status: DC

## 2020-07-15 MED ORDER — VALACYCLOVIR HCL 1 G PO TABS: 1000.0000 mg | ORAL_TABLET | Freq: Two times a day (BID) | ORAL | 0 refills | Status: DC

## 2020-07-15 NOTE — Progress Notes (Signed)
Chief Complaint  Patient presents with   Rash   F/u  Flat  red itching rash to mid back x 2 days no blisters not outside 2 dogs but they wear flea collar otc hc not helping  Using dial soap no new products    Review of Systems  Constitutional:  Negative for weight loss.  HENT:  Negative for hearing loss.   Eyes:  Negative for blurred vision.  Respiratory:  Negative for shortness of breath.   Cardiovascular:  Negative for chest pain.  Skin:  Positive for itching and rash.  Past Medical History:  Diagnosis Date   Arthritis    Cirrhosis ([place])    Depression    GERD (gastroesophageal reflux disease)    Hyperlipidemia    Hypertension    Lung cancer ([place])    Metastatic bone cancer [facility])    Past Surgical History:  Procedure Laterality Date   APPENDECTOMY  1971   CHOLECYSTECTOMY  1971   COLONOSCOPY WITH PROPOFOL N/A 11/15/2016   Procedure: COLONOSCOPY WITH PROPOFOL;  Surgeon: Jonathon Bellows, MD;  Location: [facility] ENDOSCOPY;  Service: Gastroenterology;  Laterality: N/A;   ENDOBRONCHIAL ULTRASOUND Right 07/30/2018   Procedure: ENDOBRONCHIAL ULTRASOUND;  Surgeon: Laverle Hobby, MD;  Location: [facility] ORS;  Service: Pulmonary;  Laterality: Right;   ESOPHAGOGASTRODUODENOSCOPY (EGD) WITH PROPOFOL N/A 01/07/2017   Procedure: ESOPHAGOGASTRODUODENOSCOPY (EGD) WITH PROPOFOL;  Surgeon: Jonathon Bellows, MD;  Location: [facility] ENDOSCOPY;  Service: Gastroenterology;  Laterality: N/A;   LAPAROSCOPY N/A 03/01/2017   Procedure: LAPAROSCOPY DIAGNOSTIC;  Surgeon: Robert Bellow, MD;  Location: [facility] ORS;  Service: General;  Laterality: N/A;   PORTA CATH INSERTION N/A 08/14/2018   Procedure: PORTA CATH INSERTION;  Surgeon: Algernon Huxley, MD;  Location: [place] CV LAB;  Service: Cardiovascular;  Laterality: N/A;   TONSILECTOMY, ADENOIDECTOMY, BILATERAL MYRINGOTOMY AND TUBES  1955   TONSILLECTOMY     VENTRAL HERNIA REPAIR N/A 03/01/2017   10 x 14 CM Ventralight ST mesh, intraperitoneal location.    VENTRAL  HERNIA REPAIR N/A 03/01/2017   Procedure: HERNIA REPAIR VENTRAL ADULT;  Surgeon: Robert Bellow, MD;  Location: [facility] ORS;  Service: General;  Laterality: N/A;   Family History  Problem Relation Age of Onset   Hypertension Mother    Ovarian cancer Mother 52   Heart disease Father    Stroke Father    Ovarian cancer Sister        ? dx cancer had hyst.   Breast cancer Neg Hx    Social History   Socioeconomic History   Marital status: Married    Spouse name: Johnny   Number of children: Not on file   Years of education: Not on file   Highest education level: Not on file  Occupational History   Not on file  Tobacco Use   Smoking status: Former    Packs/day: 2.00    Years: 50.00    Pack years: 100.00    Types: Cigarettes, E-cigarettes    Quit date: 02/07/2012    Years since quitting: 8.4   Smokeless tobacco: Never  Vaping Use   Vaping Use: Some days   Start date: 10/06/2013   Devices: uses no liquid  Substance and Sexual Activity   Alcohol use: No    Alcohol/week: 0.0 standard drinks   Drug use: No   Sexual activity: Yes  Other Topics Concern   Not on file  Social History Narrative   Married   Retired   Clinical cytogeneticist level of education  No children    1 cup of coffee   Social Determinants of Health   Financial Resource Strain: Low Risk    Difficulty of Paying Living Expenses: Not hard at all  Food Insecurity: No Food Insecurity   Worried About Charity fundraiser in the Last Year: Never true   Ran Out of Food in the Last Year: Never true  Transportation Needs: No Transportation Needs   Lack of Transportation (Medical): No   Lack of Transportation (Non-Medical): No  Physical Activity: Not on file  Stress: No Stress Concern Present   Feeling of Stress : Not at all  Social Connections: Not on file  Intimate Partner Violence: Not At Risk   Fear of Current or Ex-Partner: No   Emotionally Abused: No   Physically Abused: No   Sexually Abused: No    Current Meds  Medication Sig   amLODipine (NORVASC) 5 MG tablet Take 1 tablet (5 mg total) by mouth daily.   BINAXNOW COVID-19 AG HOME TEST KIT See admin instructions.   buPROPion (WELLBUTRIN XL) 300 MG 24 hr tablet Take 1 tablet by mouth once daily   clobetasol cream (TEMOVATE) 1.93 % Apply 1 application topically 2 (two) times daily. Lower back   escitalopram (LEXAPRO) 10 MG tablet Take 1 tablet by mouth once daily   folic acid (FOLVITE) 1 MG tablet Take 1 tablet by mouth once daily   ibuprofen (ADVIL,MOTRIN) 200 MG tablet Take 400 mg by mouth every 8 (eight) hours as needed for headache or moderate pain.    levothyroxine (SYNTHROID) 150 MCG tablet TAKE 1 TABLET BY MOUTH ONCE DAILY BEFORE BREAKFAST   Multiple Vitamins-Minerals (PRESERVISION AREDS 2) CAPS Take 1 capsule by mouth 2 (two) times a day.    polyethylene glycol (MIRALAX / GLYCOLAX) 17 g packet Take 17 g by mouth daily as needed for mild constipation.   rosuvastatin (CRESTOR) 20 MG tablet Take 1 tablet by mouth once daily   [DISCONTINUED] valACYclovir (VALTREX) 1000 MG tablet Take 1 tablet (1,000 mg total) by mouth 2 (two) times daily. With food x 7-10 days   Allergies  Allergen Reactions   Bee Venom Swelling   Recent Results (from the past 2160 hour(s))  Comprehensive metabolic panel     Status: None   Collection Time: 05/02/20  8:29 AM  Result Value Ref Range   Sodium 142 135 - 145 mmol/L   Potassium 4.1 3.5 - 5.1 mmol/L   Chloride 102 98 - 111 mmol/L   CO2 31 22 - 32 mmol/L   Glucose, Bld 95 70 - 99 mg/dL    Comment: Glucose reference range applies only to samples taken after fasting for at least 8 hours.   BUN 22 8 - 23 mg/dL   Creatinine, Ser 0.96 0.44 - 1.00 mg/dL   Calcium 8.9 8.9 - 10.3 mg/dL   Total Protein 7.1 6.5 - 8.1 g/dL   Albumin 3.8 3.5 - 5.0 g/dL   AST 22 15 - 41 U/L   ALT 14 0 - 44 U/L   Alkaline Phosphatase 106 38 - 126 U/L   Total Bilirubin 0.8 0.3 - 1.2 mg/dL   GFR, Estimated >60 >60 mL/min     Comment: (NOTE) Calculated using the CKD-EPI Creatinine Equation (2021)    Anion gap 9 5 - 15    Comment: Performed at [facility], [address] [place], [place] [address]  CBC with Differential     Status: Abnormal   Collection Time: 05/02/20  8:29 AM  Result Value Ref Range   WBC 5.6 4.0 - 10.5 K/uL   RBC 4.42 3.87 - 5.11 MIL/uL   Hemoglobin 12.0 12.0 - 15.0 g/dL   HCT 38.9 36.0 - 46.0 %   MCV 88.0 80.0 - 100.0 fL   MCH 27.1 26.0 - 34.0 pg   MCHC 30.8 30.0 - 36.0 g/dL   RDW 18.0 (H) 11.5 - 15.5 %   Platelets 155 150 - 400 K/uL   nRBC 0.0 0.0 - 0.2 %   Neutrophils Relative % 52 %   Neutro Abs 2.9 1.7 - 7.7 K/uL   Lymphocytes Relative 15 %   Lymphs Abs 0.8 0.7 - 4.0 K/uL   Monocytes Relative 7 %   Monocytes Absolute 0.4 0.1 - 1.0 K/uL   Eosinophils Relative 25 %   Eosinophils Absolute 1.4 (H) 0.0 - 0.5 K/uL   Basophils Relative 1 %   Basophils Absolute 0.1 0.0 - 0.1 K/uL   Immature Granulocytes 0 %   Abs Immature Granulocytes 0.01 0.00 - 0.07 K/uL    Comment: Performed at [facility], [address] [place], [place] [address]  CBC with Differential     Status: Abnormal   Collection Time: 05/20/20  9:19 AM  Result Value Ref Range   WBC 4.4 4.0 - 10.5 K/uL   RBC 4.61 3.87 - 5.11 MIL/uL   Hemoglobin 12.7 12.0 - 15.0 g/dL   HCT 40.5 36.0 - 46.0 %   MCV 87.9 80.0 - 100.0 fL   MCH 27.5 26.0 - 34.0 pg   MCHC 31.4 30.0 - 36.0 g/dL   RDW 17.2 (H) 11.5 - 15.5 %   Platelets 173 150 - 400 K/uL   nRBC 0.0 0.0 - 0.2 %   Neutrophils Relative % 71 %   Neutro Abs 3.1 1.7 - 7.7 K/uL   Lymphocytes Relative 16 %   Lymphs Abs 0.7 0.7 - 4.0 K/uL   Monocytes Relative 9 %   Monocytes Absolute 0.4 0.1 - 1.0 K/uL   Eosinophils Relative 3 %   Eosinophils Absolute 0.1 0.0 - 0.5 K/uL   Basophils Relative 1 %   Basophils Absolute 0.0 0.0 - 0.1 K/uL   Immature Granulocytes 0 %   Abs Immature Granulocytes 0.01 0.00 - 0.07 K/uL    Comment: Performed at [facility],  [address] [place], [place] [address]  Comprehensive metabolic panel     Status: Abnormal   Collection Time: 05/20/20  9:19 AM  Result Value Ref Range   Sodium 141 135 - 145 mmol/L   Potassium 3.3 (L) 3.5 - 5.1 mmol/L   Chloride 103 98 - 111 mmol/L   CO2 27 22 - 32 mmol/L   Glucose, Bld 114 (H) 70 - 99 mg/dL    Comment: Glucose reference range applies only to samples taken after fasting for at least 8 hours.   BUN 20 8 - 23 mg/dL   Creatinine, Ser 0.94 0.44 - 1.00 mg/dL   Calcium 9.3 8.9 - 10.3 mg/dL   Total Protein 7.6 6.5 - 8.1 g/dL   Albumin 4.1 3.5 - 5.0 g/dL   AST 26 15 - 41 U/L   ALT 15 0 - 44 U/L   Alkaline Phosphatase 114 38 - 126 U/L   Total Bilirubin 0.5 0.3 - 1.2 mg/dL   GFR, Estimated >60 >60 mL/min    Comment: (NOTE) Calculated using the CKD-EPI Creatinine Equation (2021)    Anion gap 11 5 - 15  Comment: Performed at [facility], [address] [place], [place] [address]  Comprehensive metabolic panel     Status: None   Collection Time: 06/10/20  9:10 AM  Result Value Ref Range   Sodium 137 135 - 145 mmol/L   Potassium 3.8 3.5 - 5.1 mmol/L   Chloride 99 98 - 111 mmol/L   CO2 27 22 - 32 mmol/L   Glucose, Bld 89 70 - 99 mg/dL    Comment: Glucose reference range applies only to samples taken after fasting for at least 8 hours.   BUN 20 8 - 23 mg/dL   Creatinine, Ser 0.92 0.44 - 1.00 mg/dL   Calcium 9.2 8.9 - 10.3 mg/dL   Total Protein 7.6 6.5 - 8.1 g/dL   Albumin 4.1 3.5 - 5.0 g/dL   AST 25 15 - 41 U/L   ALT 16 0 - 44 U/L   Alkaline Phosphatase 122 38 - 126 U/L   Total Bilirubin 0.8 0.3 - 1.2 mg/dL   GFR, Estimated >60 >60 mL/min    Comment: (NOTE) Calculated using the CKD-EPI Creatinine Equation (2021)    Anion gap 11 5 - 15    Comment: Performed at [facility], [address] [place], [place] [address]  T4, free     Status: None   Collection Time: 06/10/20  9:10 AM  Result Value Ref Range   Free T4 0.86 0.61 - 1.12 ng/dL     Comment: (NOTE) Biotin ingestion may interfere with free T4 tests. If the results are inconsistent with the TSH level, previous test results, or the clinical presentation, then consider biotin interference. If needed, order repeat testing after stopping biotin. Performed at [facility], [address] [place], [place] [address]   TSH     Status: Abnormal   Collection Time: 06/10/20  9:10 AM  Result Value Ref Range   TSH 13.263 (H) 0.350 - 4.500 uIU/mL    Comment: Performed by a 3rd Generation assay with a functional sensitivity of <=0.01 uIU/mL. Performed at [facility], [address] [place], [place] [address]   CBC with Differential     Status: Abnormal   Collection Time: 06/10/20  9:10 AM  Result Value Ref Range   WBC 4.4 4.0 - 10.5 K/uL   RBC 4.41 3.87 - 5.11 MIL/uL   Hemoglobin 12.4 12.0 - 15.0 g/dL   HCT 38.4 36.0 - 46.0 %   MCV 87.1 80.0 - 100.0 fL   MCH 28.1 26.0 - 34.0 pg   MCHC 32.3 30.0 - 36.0 g/dL   RDW 16.1 (H) 11.5 - 15.5 %   Platelets 160 150 - 400 K/uL   nRBC 0.0 0.0 - 0.2 %   Neutrophils Relative % 73 %   Neutro Abs 3.2 1.7 - 7.7 K/uL   Lymphocytes Relative 18 %   Lymphs Abs 0.8 0.7 - 4.0 K/uL   Monocytes Relative 7 %   Monocytes Absolute 0.3 0.1 - 1.0 K/uL   Eosinophils Relative 1 %   Eosinophils Absolute 0.0 0.0 - 0.5 K/uL   Basophils Relative 1 %   Basophils Absolute 0.0 0.0 - 0.1 K/uL   Immature Granulocytes 0 %   Abs Immature Granulocytes 0.01 0.00 - 0.07 K/uL    Comment: Performed at [facility], [address] [place], [place] [address]  CBC with Differential     Status: Abnormal   Collection Time: 07/01/20  8:49 AM  Result Value Ref Range   WBC  3.9 (L) 4.0 - 10.5 K/uL   RBC 4.36 3.87 - 5.11 MIL/uL   Hemoglobin 11.9 (L) 12.0 - 15.0 g/dL   HCT 38.3 36.0 - 46.0 %   MCV 87.8 80.0 - 100.0 fL   MCH 27.3 26.0 - 34.0 pg   MCHC 31.1 30.0 - 36.0 g/dL   RDW 16.0 (H) 11.5 - 15.5 %   Platelets 153 150 - 400 K/uL   nRBC  0.0 0.0 - 0.2 %   Neutrophils Relative % 66 %   Neutro Abs 2.6 1.7 - 7.7 K/uL   Lymphocytes Relative 23 %   Lymphs Abs 0.9 0.7 - 4.0 K/uL   Monocytes Relative 8 %   Monocytes Absolute 0.3 0.1 - 1.0 K/uL   Eosinophils Relative 1 %   Eosinophils Absolute 0.1 0.0 - 0.5 K/uL   Basophils Relative 2 %   Basophils Absolute 0.1 0.0 - 0.1 K/uL   Immature Granulocytes 0 %   Abs Immature Granulocytes 0.01 0.00 - 0.07 K/uL    Comment: Performed at [facility], [address] [place], [place] [address]  Comprehensive metabolic panel     Status: None   Collection Time: 07/01/20  8:49 AM  Result Value Ref Range   Sodium 140 135 - 145 mmol/L   Potassium 3.9 3.5 - 5.1 mmol/L   Chloride 100 98 - 111 mmol/L   CO2 29 22 - 32 mmol/L   Glucose, Bld 92 70 - 99 mg/dL    Comment: Glucose reference range applies only to samples taken after fasting for at least 8 hours.   BUN 19 8 - 23 mg/dL   Creatinine, Ser 0.92 0.44 - 1.00 mg/dL   Calcium 9.2 8.9 - 10.3 mg/dL   Total Protein 7.5 6.5 - 8.1 g/dL   Albumin 3.8 3.5 - 5.0 g/dL   AST 20 15 - 41 U/L   ALT 16 0 - 44 U/L   Alkaline Phosphatase 119 38 - 126 U/L   Total Bilirubin 0.5 0.3 - 1.2 mg/dL   GFR, Estimated >60 >60 mL/min    Comment: (NOTE) Calculated using the CKD-EPI Creatinine Equation (2021)    Anion gap 11 5 - 15    Comment: Performed at [facility], [address] [place],  [address]   Objective  Body mass index is 26.79 kg/m. Wt Readings from Last 3 Encounters:  07/15/20 166 lb (75.3 kg)  07/01/20 170 lb 9.6 oz (77.4 kg)  06/10/20 168 lb 14.4 oz (76.6 kg)   Temp Readings from Last 3 Encounters:  07/15/20 97.7 F (36.5 C) (Oral)  07/01/20 (!) 96.7 F (35.9 C) (Tympanic)  06/10/20 (!) 96.3 F (35.7 C)   BP Readings from Last 3 Encounters:  07/15/20 110/62  07/01/20 121/76  06/10/20 128/83   Pulse Readings from Last 3 Encounters:  07/15/20 84  07/01/20 69  06/10/20 79    Physical Exam Vitals and  nursing note reviewed.  Constitutional:      Appearance: Normal appearance. She is well-developed, well-groomed and overweight.  HENT:     Head: Normocephalic and atraumatic.  Cardiovascular:     Rate and Rhythm: Normal rate and regular rhythm.     Heart sounds: Normal heart sounds. No murmur heard. Pulmonary:     Effort: Pulmonary effort is normal.     Breath sounds: Normal breath sounds.  Skin:    General: Skin is warm and dry.       Neurological:     General: No  focal deficit present.     Mental Status: She is alert and oriented to person, place, and time. Mental status is at baseline.     Gait: Gait normal.  Psychiatric:        Attention and Perception: Attention and perception normal.        Mood and Affect: Mood and affect normal.        Speech: Speech normal.        Behavior: Behavior normal. Behavior is cooperative.        Thought Content: Thought content normal.        Cognition and Memory: Cognition and memory normal.        Judgment: Judgment normal.    Assessment  Plan  Dermatitis ? Shingles vs fungal- Plan: clobetasol cream (TEMOVATE) 0.05 % Call back if worse and will tx for fungus   Herpes zoster without complication Rec get valtrex 1 gram tid if rash gets worse   Hypothyroidism due to medication  Repeat TSH with PCP due 08/19/20 dose was adjusted by Dr. Janese Banks On levo 150 mcg qd   H/o breast cancer on radiation  F/u Dr. Janese Banks   Provider: Dr. Olivia Mackie McLean-Scocuzza-Internal Medicine

## 2020-07-15 NOTE — Patient Instructions (Signed)
Shingles  Shingles, which is also known as herpes zoster, is an infection that causes apainful skin rash and fluid-filled blisters. It is caused by a virus. Shingles only develops in people who: Have had chickenpox. Have been vaccinated against chickenpox. Shingles is rare in this group. What are the causes? Shingles is caused by varicella-zoster virus. This is the same virus that causes chickenpox. After a person is exposed to the virus, it stays in the body in an inactive (dormant) state. Shingles develops if the virus is reactivated. This can happen many years after the first (initial) exposure to the virus. It is not known what causes this virus to bereactivated. What increases the risk? People who have had chickenpox or received the chickenpox vaccine are at risk for shingles. Shingles infection is more common in people who: Are older than 72 years of age. Have a weakened disease-fighting system (immune system), such as people with: HIV (human immunodeficiency virus). AIDS (acquired immunodeficiency syndrome). Cancer. Are taking medicines that weaken the immune system, such as organ transplant medicines. Are experiencing a lot of stress. What are the signs or symptoms? Early symptoms of this condition include itching, tingling, and pain in an areaon your skin. Pain may be described as burning, stabbing, or throbbing. A few days or weeks after early symptoms start, a painful red rash appears. The rash is usually on one side of the body and has a band-like or belt-like pattern. The rash eventually turns into fluid-filled blisters that break open,change into scabs, and dry up in about 2-3 weeks. At any time during the infection, you may also develop: A fever. Chills. A headache. Nausea. How is this diagnosed? This condition is diagnosed with a skin exam. Skin or fluid samples (a culture) may be taken from the blisters before a diagnosis is made. How is this treated? The rash may last  for several weeks. There is not a specific cure for this condition. Your health care provider may prescribe medicines to help you manage pain, recover more quickly, and avoid long-term problems. Medicines may include: Antiviral medicines. Anti-inflammatory medicines. Pain medicines. Anti-itching medicines (antihistamines). If the area involved is on your face, you may be referred to a specialist, such as an eye doctor (ophthalmologist) or an ear, nose, and throat (ENT) doctor (otorhinolaryngologist) to help you avoid eye problems, chronic pain, or disability. Follow these instructions at home: Medicines Take over-the-counter and prescription medicines only as told by your health care provider. Apply an anti-itch cream or numbing cream to the affected area as told by your health care provider. Relieving itching and discomfort  Apply cold, wet cloths (cold compresses) to the area of the rash or blisters as told by your health care provider. Cool baths can be soothing. Try adding baking soda or dry oatmeal to the water to reduce itching. Do not bathe in hot water. Use calamine lotion as recommended by your health care provider. This is an over-the-counter lotion that helps to relieve itchiness.  Blister and rash care Keep your rash covered with a loose bandage (dressing). Wear loose-fitting clothing to help ease the pain of material rubbing against the rash. Wash your hands with soap and water for at least 20 seconds before and after you change your dressing. If soap and water are not available, use hand sanitizer. Change your dressing as told by your health care provider. Keep your rash and blisters clean by washing the area with mild soap and cool water as told by your health care provider.  Check your rash every day for signs of infection. Check for: More redness, swelling, or pain. Fluid or blood. Warmth. Pus or a bad smell. Do not scratch your rash or pick at your blisters. To help avoid  scratching: Keep your fingernails clean and cut short. Wear gloves or mittens while you sleep, if scratching is a problem. General instructions Rest as told by your health care provider. Wash your hands often with soap and water for at least 20 seconds. If soap and water are not available, use hand sanitizer. Doing this lowers your chance of getting a bacterial skin infection. Before your blisters change into scabs, your shingles infection can cause chickenpox in people who have never had it or have never been vaccinated against it. To prevent this from happening, avoid contact with other people, especially: Babies. Pregnant women. Children who have eczema. Older people who have transplants. People who have chronic illnesses, such as cancer or AIDS. Keep all follow-up visits. This is important. How is this prevented? Getting vaccinated is the best way to prevent shingles and protect against shingles complications. If you have not been vaccinated, talk with your healthcare provider about getting the vaccine. Where to find more information Centers for Disease Control and Prevention: http://www.wolf.info/ Contact a health care provider if: Your pain is not relieved with prescribed medicines. Your pain does not get better after the rash heals. You have any of these signs of infection: More redness, swelling, or pain around the rash. Fluid or blood coming from the rash. Warmth coming from your rash. Pus or a bad smell coming from the rash. A fever. Get help right away if: The rash is on your face or nose. You have facial pain, pain around your eye area, or loss of feeling on one side of your face. You have difficulty seeing. You have ear pain or have ringing in your ear. You have a loss of taste. Your condition gets worse. Summary Shingles, also known as herpes zoster, is an infection that causes a painful skin rash and fluid-filled blisters. This condition is diagnosed with a skin exam. Skin or  fluid samples (a culture) may be taken from the blisters. Keep your rash covered with a loose bandage (dressing). Wear loose-fitting clothing to help ease the pain of material rubbing against the rash. Before your blisters change into scabs, your shingles infection can cause chickenpox in people who have never had it or have never been vaccinated against it. This information is not intended to replace advice given to you by your health care provider. Make sure you discuss any questions you have with your healthcare provider. Document Revised: 01/18/2020 Document Reviewed: 01/18/2020 Elsevier Patient Education  [place].  Rash, Adult A rash is a change in the color of your skin. A rash can also change the way your skin feels. There are many different conditions and factors that can cause a rash. Some rashes may disappear after a few days, but some may last for a few weeks. Common causes of rashes include: Viral infections, such as: Colds. Measles. Hand, foot, and mouth disease. Bacterial infections, such as: Scarlet fever. Impetigo. Fungal infections, such as Candida. Allergic reactions to food, medicines, or skin care products. Follow these instructions at home: The goal of treatment is to stop the itching and keep the rash from spreading. Pay attention to any changes in your symptoms. Follow these instructions tohelp with your condition: Medicine Take or apply over-the-counter and prescription medicines only as told by your  health care provider. These may include: Corticosteroid creams to treat red or swollen skin. Anti-itch lotions. Oral allergy medicines (antihistamines). Oral corticosteroids for severe symptoms.  Skin care Apply cool compresses to the affected areas. Do not scratch or rub your skin. Avoid covering the rash. Make sure the rash is exposed to air as much as possible. Managing itching and discomfort Avoid hot showers or baths, which can make itching worse. A  cold shower may help. Try taking a bath with: Epsom salts. Follow manufacturer instructions on the packaging. You can get these at your local pharmacy or grocery store. Baking soda. Pour a small amount into the bath as told by your health care provider. Colloidal oatmeal. Follow manufacturer instructions on the packaging. You can get this at your local pharmacy or grocery store. Try applying baking soda paste to your skin. Stir water into baking soda until it reaches a paste-like consistency. Try applying calamine lotion. This is an over-the-counter lotion that helps to relieve itchiness. Keep cool and out of the sun. Sweating and being hot can make itching worse. General instructions  Rest as needed. Drink enough fluid to keep your urine pale yellow. Wear loose-fitting clothing. Avoid scented soaps, detergents, and perfumes. Use gentle soaps, detergents, perfumes, and other cosmetic products. Avoid any substance that causes your rash. Keep a journal to help track what causes your rash. Write down: What you eat. What cosmetic products you use. What you drink. What you wear. This includes jewelry. Keep all follow-up visits as told by your health care provider. This is important.  Contact a health care provider if: You sweat at night. You lose weight. You urinate more than normal. You urinate less than normal, or you notice that your urine is a darker color than usual. You feel weak. You vomit. Your skin or the whites of your eyes look yellow (jaundice). Your skin: Tingles. Is numb. Your rash: Does not go away after several days. Gets worse. You are: Unusually thirsty. More tired than normal. You have: New symptoms. Pain in your abdomen. A fever. Diarrhea. Get help right away if you: Have a fever and your symptoms suddenly get worse. Develop confusion. Have a severe headache or a stiff neck. Have severe joint pains or stiffness. Have a seizure. Develop a rash that  covers all or most of your body. The rash may or may not be painful. Develop blisters that: Are on top of the rash. Grow larger or grow together. Are painful. Are inside your nose or mouth. Develop a rash that: Looks like purple pinprick-sized spots all over your body. Has a "bull's eye" or looks like a target. Is not related to sun exposure, is red and painful, and causes your skin to peel. Summary A rash is a change in the color of your skin. Some rashes disappear after a few days, but some may last for a few weeks. The goal of treatment is to stop the itching and keep the rash from spreading. Take or apply over-the-counter and prescription medicines only as told by your health care provider. Contact a health care provider if you have new or worsening symptoms. Keep all follow-up visits as told by your health care provider. This is important. This information is not intended to replace advice given to you by your health care provider. Make sure you discuss any questions you have with your healthcare provider. Document Revised: 05/16/2018 Document Reviewed: 08/26/2017 Elsevier Patient Education  2022 [employer].

## 2020-07-22 ENCOUNTER — Encounter: Payer: Self-pay | Admitting: Oncology

## 2020-07-22 ENCOUNTER — Inpatient Hospital Stay: Payer: PPO

## 2020-07-22 ENCOUNTER — Inpatient Hospital Stay: Payer: PPO | Attending: Oncology

## 2020-07-22 ENCOUNTER — Inpatient Hospital Stay (HOSPITAL_BASED_OUTPATIENT_CLINIC_OR_DEPARTMENT_OTHER): Payer: PPO | Admitting: Oncology

## 2020-07-22 VITALS — BP 113/68 | HR 95 | Temp 97.7°F | Resp 16 | Ht 66.0 in | Wt 163.8 lb

## 2020-07-22 DIAGNOSIS — E785 Hyperlipidemia, unspecified: Secondary | ICD-10-CM | POA: Insufficient documentation

## 2020-07-22 DIAGNOSIS — Z8041 Family history of malignant neoplasm of ovary: Secondary | ICD-10-CM | POA: Insufficient documentation

## 2020-07-22 DIAGNOSIS — E063 Autoimmune thyroiditis: Secondary | ICD-10-CM | POA: Insufficient documentation

## 2020-07-22 DIAGNOSIS — Z87891 Personal history of nicotine dependence: Secondary | ICD-10-CM | POA: Diagnosis not present

## 2020-07-22 DIAGNOSIS — Z5111 Encounter for antineoplastic chemotherapy: Secondary | ICD-10-CM

## 2020-07-22 DIAGNOSIS — E669 Obesity, unspecified: Secondary | ICD-10-CM | POA: Diagnosis not present

## 2020-07-22 DIAGNOSIS — R630 Anorexia: Secondary | ICD-10-CM | POA: Insufficient documentation

## 2020-07-22 DIAGNOSIS — C3411 Malignant neoplasm of upper lobe, right bronchus or lung: Secondary | ICD-10-CM

## 2020-07-22 DIAGNOSIS — Z9049 Acquired absence of other specified parts of digestive tract: Secondary | ICD-10-CM | POA: Diagnosis not present

## 2020-07-22 DIAGNOSIS — K7460 Unspecified cirrhosis of liver: Secondary | ICD-10-CM | POA: Diagnosis not present

## 2020-07-22 DIAGNOSIS — Z5112 Encounter for antineoplastic immunotherapy: Secondary | ICD-10-CM | POA: Diagnosis not present

## 2020-07-22 DIAGNOSIS — Z79890 Hormone replacement therapy (postmenopausal): Secondary | ICD-10-CM | POA: Insufficient documentation

## 2020-07-22 DIAGNOSIS — Z79899 Other long term (current) drug therapy: Secondary | ICD-10-CM | POA: Diagnosis not present

## 2020-07-22 DIAGNOSIS — R634 Abnormal weight loss: Secondary | ICD-10-CM | POA: Insufficient documentation

## 2020-07-22 DIAGNOSIS — I10 Essential (primary) hypertension: Secondary | ICD-10-CM | POA: Diagnosis not present

## 2020-07-22 DIAGNOSIS — C7951 Secondary malignant neoplasm of bone: Secondary | ICD-10-CM | POA: Insufficient documentation

## 2020-07-22 DIAGNOSIS — Z923 Personal history of irradiation: Secondary | ICD-10-CM | POA: Insufficient documentation

## 2020-07-22 LAB — CBC WITH DIFFERENTIAL/PLATELET
Abs Immature Granulocytes: 0.01 10*3/uL (ref 0.00–0.07)
Basophils Absolute: 0.1 10*3/uL (ref 0.0–0.1)
Basophils Relative: 1 %
Eosinophils Absolute: 0.2 10*3/uL (ref 0.0–0.5)
Eosinophils Relative: 4 %
HCT: 38.7 % (ref 36.0–46.0)
Hemoglobin: 12.3 g/dL (ref 12.0–15.0)
Immature Granulocytes: 0 %
Lymphocytes Relative: 20 %
Lymphs Abs: 1.1 10*3/uL (ref 0.7–4.0)
MCH: 28 pg (ref 26.0–34.0)
MCHC: 31.8 g/dL (ref 30.0–36.0)
MCV: 88 fL (ref 80.0–100.0)
Monocytes Absolute: 0.5 10*3/uL (ref 0.1–1.0)
Monocytes Relative: 9 %
Neutro Abs: 3.6 10*3/uL (ref 1.7–7.7)
Neutrophils Relative %: 66 %
Platelets: 150 10*3/uL (ref 150–400)
RBC: 4.4 MIL/uL (ref 3.87–5.11)
RDW: 15.7 % — ABNORMAL HIGH (ref 11.5–15.5)
WBC: 5.4 10*3/uL (ref 4.0–10.5)
nRBC: 0 % (ref 0.0–0.2)

## 2020-07-22 LAB — COMPREHENSIVE METABOLIC PANEL
ALT: 16 U/L (ref 0–44)
AST: 21 U/L (ref 15–41)
Albumin: 3.9 g/dL (ref 3.5–5.0)
Alkaline Phosphatase: 119 U/L (ref 38–126)
Anion gap: 10 (ref 5–15)
BUN: 23 mg/dL (ref 8–23)
CO2: 29 mmol/L (ref 22–32)
Calcium: 9.4 mg/dL (ref 8.9–10.3)
Chloride: 99 mmol/L (ref 98–111)
Creatinine, Ser: 0.9 mg/dL (ref 0.44–1.00)
GFR, Estimated: >60 mL/min (ref >60)
Glucose, Bld: 95 mg/dL (ref 70–99)
Potassium: 3.7 mmol/L (ref 3.5–5.1)
Sodium: 138 mmol/L (ref 135–145)
Total Bilirubin: 0.5 mg/dL (ref 0.3–1.2)
Total Protein: 7.5 g/dL (ref 6.5–8.1)

## 2020-07-22 LAB — TSH: TSH: 0.025 u[IU]/mL — ABNORMAL LOW (ref 0.350–4.500)

## 2020-07-22 MED ORDER — SODIUM CHLORIDE 0.9 % IV SOLN
1200.0000 mg | Freq: Once | INTRAVENOUS | Status: AC
  Administered 2020-07-22: 1200 mg via INTRAVENOUS
  Filled 2020-07-22: qty 20

## 2020-07-22 MED ORDER — HEPARIN SOD (PORK) LOCK FLUSH 100 UNIT/ML IV SOLN
INTRAVENOUS | Status: AC
  Filled 2020-07-22: qty 5

## 2020-07-22 MED ORDER — SODIUM CHLORIDE 0.9 % IV SOLN
Freq: Once | INTRAVENOUS | Status: AC
Start: 2020-07-22 — End: 2020-07-22
  Filled 2020-07-22: qty 250

## 2020-07-22 MED ORDER — HEPARIN SOD (PORK) LOCK FLUSH 100 UNIT/ML IV SOLN
500.0000 [IU] | Freq: Once | INTRAVENOUS | Status: AC | PRN
  Administered 2020-07-22: 500 [IU]
  Filled 2020-07-22: qty 5

## 2020-07-22 NOTE — Patient Instructions (Signed)
[facility]  Discharge Instructions: Thank you for choosing [facility] to provide your oncology and hematology care.  If you have a lab appointment with the [facility], please go directly to the [facility] and check in at the registration area.  Wear comfortable clothing and clothing appropriate for easy access to any Portacath or PICC line.   We strive to give you quality time with your provider. You may need to reschedule your appointment if you arrive late (15 or more minutes).  Arriving late affects you and other patients whose appointments are after yours.  Also, if you miss three or more appointments without notifying the office, you may be dismissed from the clinic at the provider's discretion.      For prescription refill requests, have your pharmacy contact our office and allow 72 hours for refills to be completed.    Today you received the following chemotherapy and/or immunotherapy agents Tecentriq       To help prevent nausea and vomiting after your treatment, we encourage you to take your nausea medication as directed.  BELOW ARE SYMPTOMS THAT SHOULD BE REPORTED IMMEDIATELY: *FEVER GREATER THAN 100.4 F (38 C) OR HIGHER *CHILLS OR SWEATING *NAUSEA AND VOMITING THAT IS NOT CONTROLLED WITH YOUR NAUSEA MEDICATION *UNUSUAL SHORTNESS OF BREATH *UNUSUAL BRUISING OR BLEEDING *URINARY PROBLEMS (pain or burning when urinating, or frequent urination) *BOWEL PROBLEMS (unusual diarrhea, constipation, pain near the anus) TENDERNESS IN MOUTH AND THROAT WITH OR WITHOUT PRESENCE OF ULCERS (sore throat, sores in mouth, or a toothache) UNUSUAL RASH, SWELLING OR PAIN  UNUSUAL VAGINAL DISCHARGE OR ITCHING   Items with * indicate a potential emergency and should be followed up as soon as possible or go to the Emergency Department if any problems should occur.  Please show the CHEMOTHERAPY ALERT CARD or IMMUNOTHERAPY ALERT CARD at check-in  to the Emergency Department and triage nurse.  Should you have questions after your visit or need to cancel or reschedule your appointment, please contact [facility]  [phone] and follow the prompts.  Office hours are 8:00 a.m. to 4:30 p.m. Monday - Friday. Please note that voicemails left after 4:00 p.m. may not be returned until the following business day.  We are closed weekends and major holidays. You have access to a nurse at all times for urgent questions. Please call the main number to the clinic [phone] and follow the prompts.  For any non-urgent questions, you may also contact your provider using MyChart. We now offer e-Visits for anyone 3 and older to request care online for non-urgent symptoms. For details visit [email or web address].   Also download the MyChart app! Go to the app store, search "MyChart", open the app, select [facility], and log in with your MyChart username and password.  Due to Covid, a mask is required upon entering the hospital/clinic. If you do not have a mask, one will be given to you upon arrival. For doctor visits, patients may have 1 support person aged 48 or older with them. For treatment visits, patients cannot have anyone with them due to current Covid guidelines and our immunocompromised population.

## 2020-07-22 NOTE — Progress Notes (Signed)
Pt not eating very much. Does not get hungry. She snacks. Today she had a 1/2 pimento cheese sandwich

## 2020-07-24 ENCOUNTER — Encounter: Payer: Self-pay | Admitting: Oncology

## 2020-07-24 NOTE — Progress Notes (Signed)
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  She has been referred to [place] for findings of bone metastases and her recent MRI.  She has a prior history of 3 packs/day day smoking for over 45 years and quit smoking 5 years ago.She had a CT chest abdomen pelvis in 2018 which showed a 5 mm lung nodule in the left lower lobe.  Recently over the last 2 months patient has been having worsening back pain and was referred to orthopedics.  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  Pathologic fracture of S1.   PET scan showed 2 RUL lung nodules, hilar and mediastinal adenopathy and widespread bone mets. MRI brain negative.   Patient completed palliative RT to her spine. Bronchoscopy showed small cell lung cancer.  Palliative carboplatin, etoposide and Tecentriq started on 08/18/2018. Scans after 4 cycles showed stable disease. She is on maintenance tecentriq  Interval history-patient reports that her appetite has been poor mostly when she tries to eat when she can.  She has been gradually losing weight and her weight is down to 163 pounds  today as compared to 170 pounds a month ago.  ECOG PS- 1 Pain scale- 0   Review of systems- Review of Systems  Constitutional:  Positive for weight loss. Negative for chills, fever and malaise/fatigue.       Poor appetite  HENT:  Negative for congestion, ear discharge and nosebleeds.   Eyes:  Negative for blurred vision.  Respiratory:  Negative for cough, hemoptysis, sputum production, shortness of breath and wheezing.   Cardiovascular:  Negative for chest pain, palpitations, orthopnea and claudication.  Gastrointestinal:  Negative for abdominal pain, blood in stool, constipation, diarrhea, heartburn, melena, nausea and vomiting.  Genitourinary:  Negative for dysuria, flank pain, frequency, hematuria and urgency.  Musculoskeletal:  Negative for back pain, joint pain and myalgias.  Skin:  Negative for rash.  Neurological:  Negative for dizziness, tingling, focal weakness, seizures, weakness and headaches.  Endo/Heme/Allergies:  Does not bruise/bleed easily.  Psychiatric/Behavioral:  Negative for depression and suicidal ideas. The patient does not have insomnia.      Allergies  Allergen Reactions   Bee Venom Swelling     Past Medical History:  Diagnosis Date   Arthritis    Cirrhosis ([place])    Depression    GERD (gastroesophageal reflux disease)    Hyperlipidemia    Hypertension    Lung cancer ([place])    Metastatic bone cancer [facility])      Past Surgical History:  Procedure Laterality Date   APPENDECTOMY  1971   CHOLECYSTECTOMY  1971   COLONOSCOPY WITH PROPOFOL N/A 11/15/2016   Procedure: COLONOSCOPY WITH PROPOFOL;  Surgeon: Jonathon Bellows, MD;  Location:  [facility] ENDOSCOPY;  Service: Gastroenterology;  Laterality: N/A;   ENDOBRONCHIAL ULTRASOUND Right 07/30/2018   Procedure: ENDOBRONCHIAL ULTRASOUND;  Surgeon: Laverle Hobby, MD;  Location: [facility] ORS;  Service: Pulmonary;  Laterality: Right;   ESOPHAGOGASTRODUODENOSCOPY (EGD) WITH PROPOFOL N/A 01/07/2017   Procedure:  ESOPHAGOGASTRODUODENOSCOPY (EGD) WITH PROPOFOL;  Surgeon: Jonathon Bellows, MD;  Location: [facility] ENDOSCOPY;  Service: Gastroenterology;  Laterality: N/A;   LAPAROSCOPY N/A 03/01/2017   Procedure: LAPAROSCOPY DIAGNOSTIC;  Surgeon: Robert Bellow, MD;  Location: [facility] ORS;  Service: General;  Laterality: N/A;   PORTA CATH INSERTION N/A 08/14/2018   Procedure: PORTA CATH INSERTION;  Surgeon: Algernon Huxley, MD;  Location: [facility];  Service: Cardiovascular;  Laterality: N/A;   TONSILECTOMY, ADENOIDECTOMY, BILATERAL MYRINGOTOMY AND TUBES  1955   TONSILLECTOMY     VENTRAL HERNIA REPAIR N/A 03/01/2017   10 x 14 CM Ventralight ST mesh, intraperitoneal location.    VENTRAL HERNIA REPAIR N/A 03/01/2017   Procedure: HERNIA REPAIR VENTRAL ADULT;  Surgeon: Robert Bellow, MD;  Location: [facility] ORS;  Service: General;  Laterality: N/A;    Social History   Socioeconomic History   Marital status: Married    Spouse name: Johnny   Number of children: Not on file   Years of education: Not on file   Highest education level: Not on file  Occupational History   Not on file  Tobacco Use   Smoking status: Former    Packs/day: 2.00    Years: 50.00    Pack years: 100.00    Types: Cigarettes, E-cigarettes    Quit date: 02/07/2012    Years since quitting: 8.4   Smokeless tobacco: Never  Vaping Use   Vaping Use: Some days   Start date: 10/06/2013   Devices: uses no liquid  Substance and Sexual Activity   Alcohol use: No    Alcohol/week: 0.0 standard drinks   Drug use: No   Sexual activity: Yes  Other Topics Concern   Not on file  Social History Narrative   Married   Retired   Clinical cytogeneticist level of education    No children    1 cup of coffee   Social Determinants of Radio broadcast assistant Strain: Low Risk    Difficulty of Paying Living Expenses: Not hard at all  Food Insecurity: No Food Insecurity   Worried About Charity fundraiser in the Last Year: Never true   Academic librarian in the Last Year: Never true  Transportation Needs: No Transportation Needs   Lack of Transportation (Medical): No   Lack of Transportation (Non-Medical): No  Physical Activity: Not on file  Stress: No Stress Concern Present   Feeling of Stress : Not at all  Social Connections: Not on file  Intimate Partner Violence: Not At Risk   Fear of Current or Ex-Partner: No   Emotionally Abused: No   Physically Abused: No   Sexually Abused: No    Family History  Problem Relation Age of Onset   Hypertension Mother    Ovarian cancer Mother 6   Heart disease Father    Stroke Father    Ovarian cancer Sister        ? dx cancer had hyst.   Breast cancer Neg Hx      Current Outpatient Medications:    amLODipine (NORVASC) 5 MG tablet, Take 1 tablet (5 mg total) by mouth daily., Disp: 90 tablet, Rfl: 3   buPROPion (WELLBUTRIN XL) 300 MG 24  hr tablet, Take 1 tablet by mouth once daily, Disp: 90 tablet, Rfl: 0   clobetasol cream (TEMOVATE) 5.17 %, Apply 1 application topically 2 (two) times daily. Lower back, Disp: 60 g, Rfl: 1   erythromycin ophthalmic ointment, Place 1 application into the right eye daily as needed., Disp: , Rfl:    escitalopram (LEXAPRO) 10 MG tablet, Take 1 tablet by mouth once daily, Disp: 90 tablet, Rfl: 0   folic acid (FOLVITE) 1 MG tablet, Take 1 tablet by mouth once daily, Disp: 90 tablet, Rfl: 0   ibuprofen (ADVIL,MOTRIN) 200 MG tablet, Take 400 mg by mouth every 8 (eight) hours as needed for headache or moderate pain. , Disp: , Rfl:    levothyroxine (SYNTHROID) 150 MCG tablet, TAKE 1 TABLET BY MOUTH ONCE DAILY BEFORE BREAKFAST, Disp: 90 tablet, Rfl: 0   lidocaine-prilocaine (EMLA) cream, Apply to affected area once, Disp: 30 g, Rfl: 3   Multiple Vitamins-Minerals (PRESERVISION AREDS 2) CAPS, Take 1 capsule by mouth 2 (two) times a day. , Disp: , Rfl:    polyethylene glycol (MIRALAX / GLYCOLAX) 17 g packet, Take 17 g by mouth daily as needed for mild constipation.,  Disp: 14 each, Rfl: 0   rosuvastatin (CRESTOR) 20 MG tablet, Take 1 tablet by mouth once daily, Disp: 90 tablet, Rfl: 0   valACYclovir (VALTREX) 1000 MG tablet, Take 1 tablet (1,000 mg total) by mouth 3 (three) times daily. With food x 7-10 days, Disp: 30 tablet, Rfl: 0   BINAXNOW COVID-19 AG HOME TEST KIT, See admin instructions., Disp: , Rfl:    fluticasone (FLONASE) 50 MCG/ACT nasal spray, Place 2 sprays into both nostrils daily. (Patient not taking: No sig reported), Disp: 16 g, Rfl: 6   loperamide (IMODIUM) 2 MG capsule, Take 1 capsule (2 mg total) by mouth every 2 (two) hours as needed for diarrhea or loose stools. Do not exceed $RemoveBe'16mg'HstYxMAaT$  daily. (Patient not taking: No sig reported), Disp: 30 capsule, Rfl: 1   ondansetron (ZOFRAN ODT) 4 MG disintegrating tablet, Take 1 tablet (4 mg total) by mouth every 8 (eight) hours as needed for nausea or vomiting. (Patient not taking: No sig reported), Disp: 20 tablet, Rfl: 0   pantoprazole (PROTONIX) 40 MG tablet, TAKE 1 TABLET BY MOUTH TWICE DAILY BEFORE A MEAL (Patient not taking: No sig reported), Disp: 180 tablet, Rfl: 0 No current facility-administered medications for this visit.  Facility-Administered Medications Ordered in Other Visits:    denosumab (XGEVA) injection 120 mg, 120 mg, Subcutaneous, Q30 days, Sindy Guadeloupe, MD, 120 mg at 03/05/19 1525  Physical exam:  Vitals:   07/22/20 1352 07/22/20 1355  BP:  113/68  Pulse:  95  Resp:  16  Temp:  97.7 F (36.5 C)  TempSrc:  Tympanic  Weight:  163 lb 12.8 oz (74.3 kg)  Height: $Remove'5\' 6"'KJTOFYl$  (1.676 m) $RemoveB'5\' 6"'uyMROCPP$  (1.676 m)   Physical Exam Constitutional:      General: She is not in acute distress. Cardiovascular:     Rate and Rhythm: Normal rate and regular rhythm.     Heart sounds: Normal heart sounds.  Pulmonary:     Effort: Pulmonary effort is normal.     Breath sounds: Normal breath sounds.  Skin:    General: Skin is warm and dry.  Neurological:     Mental Status: She is alert and oriented to  person, place, and time.     CMP Latest Ref Rng & Units 07/22/2020  Glucose 70 - 99 mg/dL  95  BUN 8 - 23 mg/dL 23  Creatinine 0.44 - 1.00 mg/dL 0.90  Sodium 135 - 145 mmol/L 138  Potassium 3.5 - 5.1 mmol/L 3.7  Chloride 98 - 111 mmol/L 99  CO2 22 - 32 mmol/L 29  Calcium 8.9 - 10.3 mg/dL 9.4  Total Protein 6.5 - 8.1 g/dL 7.5  Total Bilirubin 0.3 - 1.2 mg/dL 0.5  Alkaline Phos 38 - 126 U/L 119  AST 15 - 41 U/L 21  ALT 0 - 44 U/L 16   CBC Latest Ref Rng & Units 07/22/2020  WBC 4.0 - 10.5 K/uL 5.4  Hemoglobin 12.0 - 15.0 g/dL 12.3  Hematocrit 36.0 - 46.0 % 38.7  Platelets 150 - 400 K/uL 150     Assessment and plan- Patient is a 70 y.o. 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  I will see her back in 3 weeks for cycle 32.  Plan to get repeat CT chest abdomen pelvis without contrast and bone scan prior.  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  Her TSH has been up and down sometimes high sometimes low [place] unclear of the patient is taking levothyroxine the way she showed before I can adjust her dosing.  I will plan to repeat it in 3 weeks and if it continues to be uncontrolled I will refer her back to endocrinology who she has seen before  Weight loss: Secondary to poor appetite.  Patient encouraged to tries protein drinks in addition to having small frequent meals.  Unclear if thyroid is playing a role in her weight loss since her most recent TSH was low but the previous value was 13    Visit Diagnosis 1. Small cell lung cancer, right upper lobe ([place])   2. Encounter for antineoplastic chemotherapy   3. Autoimmune hypothyroidism      Dr. Randa Evens, MD, MPH [facility] [phone] 07/24/2020 7:02 PM

## 2020-07-28 ENCOUNTER — Encounter: Payer: Self-pay | Admitting: Oncology

## 2020-07-29 ENCOUNTER — Telehealth: Payer: Self-pay | Admitting: *Deleted

## 2020-07-29 ENCOUNTER — Telehealth: Payer: Self-pay | Admitting: Oncology

## 2020-07-29 DIAGNOSIS — C3490 Malignant neoplasm of unspecified part of unspecified bronchus or lung: Secondary | ICD-10-CM

## 2020-07-29 NOTE — Telephone Encounter (Signed)
Spoke with pt's daughter, Levada Dy, and made aware of Dr. Elroy Channel recommendations. Levada Dy requested that I call pt and let her know as well. Attempted to call pt without any answer. Will try again.

## 2020-07-29 NOTE — Telephone Encounter (Signed)
Pt made aware of brain MRI.

## 2020-07-29 NOTE — Telephone Encounter (Signed)
Left VM with patient about changes to appt schedule. Mailing AVS.

## 2020-08-01 ENCOUNTER — Encounter
Admission: RE | Admit: 2020-08-01 | Discharge: 2020-08-01 | Disposition: A | Discharge status: Home / Self Care | Payer: PPO | Source: Ambulatory Visit | Attending: Oncology | Admitting: Oncology

## 2020-08-01 ENCOUNTER — Other Ambulatory Visit: Payer: Self-pay

## 2020-08-01 ENCOUNTER — Ambulatory Visit
Admission: RE | Admit: 2020-08-01 | Discharge: 2020-08-01 | Disposition: A | Discharge status: Home / Self Care | Payer: PPO | Source: Ambulatory Visit | Attending: Oncology | Admitting: Oncology

## 2020-08-01 DIAGNOSIS — C3490 Malignant neoplasm of unspecified part of unspecified bronchus or lung: Secondary | ICD-10-CM | POA: Diagnosis not present

## 2020-08-01 DIAGNOSIS — C3411 Malignant neoplasm of upper lobe, right bronchus or lung: Secondary | ICD-10-CM | POA: Insufficient documentation

## 2020-08-01 DIAGNOSIS — C7951 Secondary malignant neoplasm of bone: Secondary | ICD-10-CM | POA: Diagnosis not present

## 2020-08-01 DIAGNOSIS — J432 Centrilobular emphysema: Secondary | ICD-10-CM | POA: Diagnosis not present

## 2020-08-01 DIAGNOSIS — I2510 Atherosclerotic heart disease of native coronary artery without angina pectoris: Secondary | ICD-10-CM | POA: Diagnosis not present

## 2020-08-01 DIAGNOSIS — M4854XA Collapsed vertebra, not elsewhere classified, thoracic region, initial encounter for fracture: Secondary | ICD-10-CM | POA: Diagnosis not present

## 2020-08-01 DIAGNOSIS — K7460 Unspecified cirrhosis of liver: Secondary | ICD-10-CM | POA: Diagnosis not present

## 2020-08-01 DIAGNOSIS — M4316 Spondylolisthesis, lumbar region: Secondary | ICD-10-CM | POA: Diagnosis not present

## 2020-08-01 MED ORDER — TECHNETIUM TC 99M MEDRONATE IV KIT
20.0000 | PACK | Freq: Once | INTRAVENOUS | Status: AC | PRN
  Administered 2020-08-01: 21.08 via INTRAVENOUS

## 2020-08-02 DIAGNOSIS — H353210 Exudative age-related macular degeneration, right eye, stage unspecified: Secondary | ICD-10-CM | POA: Diagnosis not present

## 2020-08-03 ENCOUNTER — Ambulatory Visit: Admission: RE | Admit: 2020-08-03 | Payer: PPO | Source: Ambulatory Visit

## 2020-08-04 ENCOUNTER — Telehealth: Payer: Self-pay | Admitting: Oncology

## 2020-08-04 NOTE — Telephone Encounter (Signed)
Called patient's daughter to make her aware of reschedule brain MRI (patient missed appt). Daughter stated she will assist in reminding her mom closer to the date.   Also called patient to give her an update and she was agreeable with the day and time.

## 2020-08-09 ENCOUNTER — Ambulatory Visit
Admission: RE | Admit: 2020-08-09 | Discharge: 2020-08-09 | Disposition: A | Discharge status: Home / Self Care | Payer: PPO | Source: Ambulatory Visit | Attending: Oncology | Admitting: Oncology

## 2020-08-09 ENCOUNTER — Other Ambulatory Visit: Payer: Self-pay

## 2020-08-09 DIAGNOSIS — C3490 Malignant neoplasm of unspecified part of unspecified bronchus or lung: Secondary | ICD-10-CM | POA: Insufficient documentation

## 2020-08-09 DIAGNOSIS — C7951 Secondary malignant neoplasm of bone: Secondary | ICD-10-CM | POA: Diagnosis not present

## 2020-08-09 MED ORDER — GADOBUTROL 1 MMOL/ML IV SOLN
7.5000 mL | Freq: Once | INTRAVENOUS | Status: AC | PRN
  Administered 2020-08-09: 7.5 mL via INTRAVENOUS

## 2020-08-12 ENCOUNTER — Inpatient Hospital Stay: Payer: PPO

## 2020-08-12 ENCOUNTER — Inpatient Hospital Stay: Payer: PPO | Attending: Oncology

## 2020-08-12 ENCOUNTER — Inpatient Hospital Stay (HOSPITAL_BASED_OUTPATIENT_CLINIC_OR_DEPARTMENT_OTHER): Payer: PPO | Admitting: Oncology

## 2020-08-12 ENCOUNTER — Encounter: Payer: Self-pay | Admitting: Oncology

## 2020-08-12 VITALS — BP 135/95 | HR 80 | Temp 97.2°F | Resp 16 | Wt 167.0 lb

## 2020-08-12 DIAGNOSIS — E785 Hyperlipidemia, unspecified: Secondary | ICD-10-CM | POA: Diagnosis not present

## 2020-08-12 DIAGNOSIS — Z5112 Encounter for antineoplastic immunotherapy: Secondary | ICD-10-CM | POA: Diagnosis not present

## 2020-08-12 DIAGNOSIS — Z79890 Hormone replacement therapy (postmenopausal): Secondary | ICD-10-CM | POA: Insufficient documentation

## 2020-08-12 DIAGNOSIS — Z9049 Acquired absence of other specified parts of digestive tract: Secondary | ICD-10-CM | POA: Diagnosis not present

## 2020-08-12 DIAGNOSIS — Z8041 Family history of malignant neoplasm of ovary: Secondary | ICD-10-CM | POA: Diagnosis not present

## 2020-08-12 DIAGNOSIS — Z87891 Personal history of nicotine dependence: Secondary | ICD-10-CM | POA: Insufficient documentation

## 2020-08-12 DIAGNOSIS — I10 Essential (primary) hypertension: Secondary | ICD-10-CM | POA: Insufficient documentation

## 2020-08-12 DIAGNOSIS — Z79899 Other long term (current) drug therapy: Secondary | ICD-10-CM | POA: Diagnosis not present

## 2020-08-12 DIAGNOSIS — C3490 Malignant neoplasm of unspecified part of unspecified bronchus or lung: Secondary | ICD-10-CM | POA: Diagnosis not present

## 2020-08-12 DIAGNOSIS — C3411 Malignant neoplasm of upper lobe, right bronchus or lung: Secondary | ICD-10-CM | POA: Diagnosis not present

## 2020-08-12 DIAGNOSIS — R5383 Other fatigue: Secondary | ICD-10-CM | POA: Diagnosis not present

## 2020-08-12 DIAGNOSIS — E669 Obesity, unspecified: Secondary | ICD-10-CM | POA: Diagnosis not present

## 2020-08-12 DIAGNOSIS — E063 Autoimmune thyroiditis: Secondary | ICD-10-CM | POA: Diagnosis not present

## 2020-08-12 DIAGNOSIS — K7460 Unspecified cirrhosis of liver: Secondary | ICD-10-CM | POA: Diagnosis not present

## 2020-08-12 DIAGNOSIS — Z923 Personal history of irradiation: Secondary | ICD-10-CM | POA: Insufficient documentation

## 2020-08-12 DIAGNOSIS — C7951 Secondary malignant neoplasm of bone: Secondary | ICD-10-CM | POA: Diagnosis not present

## 2020-08-12 LAB — CBC WITH DIFFERENTIAL/PLATELET
Abs Immature Granulocytes: 0.01 10*3/uL (ref 0.00–0.07)
Basophils Absolute: 0 10*3/uL (ref 0.0–0.1)
Basophils Relative: 1 %
Eosinophils Absolute: 0.1 10*3/uL (ref 0.0–0.5)
Eosinophils Relative: 1 %
HCT: 38 % (ref 36.0–46.0)
Hemoglobin: 12.2 g/dL (ref 12.0–15.0)
Immature Granulocytes: 0 %
Lymphocytes Relative: 21 %
Lymphs Abs: 1 10*3/uL (ref 0.7–4.0)
MCH: 28.2 pg (ref 26.0–34.0)
MCHC: 32.1 g/dL (ref 30.0–36.0)
MCV: 88 fL (ref 80.0–100.0)
Monocytes Absolute: 0.4 10*3/uL (ref 0.1–1.0)
Monocytes Relative: 9 %
Neutro Abs: 3 10*3/uL (ref 1.7–7.7)
Neutrophils Relative %: 68 %
Platelets: 155 10*3/uL (ref 150–400)
RBC: 4.32 MIL/uL (ref 3.87–5.11)
RDW: 15.5 % (ref 11.5–15.5)
WBC: 4.4 10*3/uL (ref 4.0–10.5)
nRBC: 0 % (ref 0.0–0.2)

## 2020-08-12 LAB — COMPREHENSIVE METABOLIC PANEL
ALT: 22 U/L (ref 0–44)
AST: 27 U/L (ref 15–41)
Albumin: 3.5 g/dL (ref 3.5–5.0)
Alkaline Phosphatase: 106 U/L (ref 38–126)
Anion gap: 8 (ref 5–15)
BUN: 17 mg/dL (ref 8–23)
CO2: 30 mmol/L (ref 22–32)
Calcium: 9.2 mg/dL (ref 8.9–10.3)
Chloride: 102 mmol/L (ref 98–111)
Creatinine, Ser: 0.87 mg/dL (ref 0.44–1.00)
GFR, Estimated: >60 mL/min (ref >60)
Glucose, Bld: 101 mg/dL — ABNORMAL HIGH (ref 70–99)
Potassium: 3.9 mmol/L (ref 3.5–5.1)
Sodium: 140 mmol/L (ref 135–145)
Total Bilirubin: 0.5 mg/dL (ref 0.3–1.2)
Total Protein: 6.9 g/dL (ref 6.5–8.1)

## 2020-08-12 MED ORDER — SODIUM CHLORIDE 0.9% FLUSH
10.0000 mL | Freq: Once | INTRAVENOUS | Status: AC
  Administered 2020-08-12: 10 mL via INTRAVENOUS
  Filled 2020-08-12: qty 10

## 2020-08-12 MED ORDER — SODIUM CHLORIDE 0.9 % IV SOLN
Freq: Once | INTRAVENOUS | Status: AC
Start: 2020-08-12 — End: 2020-08-12
  Filled 2020-08-12: qty 250

## 2020-08-12 MED ORDER — SODIUM CHLORIDE 0.9 % IV SOLN
1200.0000 mg | Freq: Once | INTRAVENOUS | Status: AC
  Administered 2020-08-12: 1200 mg via INTRAVENOUS
  Filled 2020-08-12: qty 20

## 2020-08-12 MED ORDER — HEPARIN SOD (PORK) LOCK FLUSH 100 UNIT/ML IV SOLN
500.0000 [IU] | Freq: Once | INTRAVENOUS | Status: AC
  Administered 2020-08-12: 500 [IU] via INTRAVENOUS
  Filled 2020-08-12: qty 5

## 2020-08-12 NOTE — Progress Notes (Signed)
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  She has been referred to [place] for findings of bone metastases and her recent MRI.  She has a prior history of 3 packs/day day smoking for over 45 years and quit smoking 5 years ago.She had a CT chest abdomen pelvis in 2018 which showed a 5 mm lung nodule in the left lower lobe.  Recently over the last 2 months patient has been having worsening back pain and was referred to orthopedics.  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  Pathologic fracture of S1.   PET scan showed 2 RUL lung nodules, hilar and mediastinal adenopathy and widespread bone mets. MRI brain negative.   Patient completed palliative RT to her spine. Bronchoscopy showed small cell lung cancer.  Palliative carboplatin, etoposide and Tecentriq started on 08/18/2018. Scans after 4 cycles showed stable disease. She is on maintenance tecentriq  Interval history-appetite is fair and patient states that she does not have much desire to eat.  Her weight was down to 163 pounds last time but is up to 167 pounds today.  Reports  being compliant with her thyroid medications  ECOG PS- 1 Pain scale- 0   Review of systems- Review of Systems  Constitutional:  Positive for malaise/fatigue. Negative for chills, fever and weight loss.  HENT:  Negative for congestion, ear discharge and nosebleeds.   Eyes:  Negative for blurred vision.  Respiratory:  Negative for cough, hemoptysis, sputum production, shortness of breath and wheezing.   Cardiovascular:  Negative for chest pain, palpitations, orthopnea and claudication.  Gastrointestinal:  Negative for abdominal pain, blood in stool, constipation, diarrhea, heartburn, melena, nausea and vomiting.  Genitourinary:  Negative for dysuria, flank pain, frequency, hematuria and urgency.  Musculoskeletal:  Negative for back pain, joint pain and myalgias.  Skin:  Negative for rash.  Neurological:  Negative for dizziness, tingling, focal weakness, seizures, weakness and headaches.  Endo/Heme/Allergies:  Does not bruise/bleed easily.  Psychiatric/Behavioral:  Negative for depression and suicidal ideas. The patient does not have insomnia.       Allergies  Allergen Reactions   Bee Venom Swelling     Past Medical History:  Diagnosis Date   Arthritis    Cirrhosis ([place])    Depression    GERD (gastroesophageal reflux disease)    Hyperlipidemia    Hypertension    Lung cancer ([place])    Metastatic bone cancer ([place])      Past Surgical History:  Procedure Laterality Date   APPENDECTOMY  1971   CHOLECYSTECTOMY  1971   COLONOSCOPY WITH PROPOFOL N/A 11/15/2016   Procedure: COLONOSCOPY WITH PROPOFOL;  Surgeon: Jonathon Bellows, MD;  Location: [facility] ENDOSCOPY;  Service: Gastroenterology;  Laterality: N/A;  ENDOBRONCHIAL ULTRASOUND Right 07/30/2018   Procedure: ENDOBRONCHIAL ULTRASOUND;  Surgeon: Laverle Hobby, MD;  Location: [facility] ORS;  Service: Pulmonary;  Laterality: Right;   ESOPHAGOGASTRODUODENOSCOPY (EGD) WITH PROPOFOL N/A 01/07/2017   Procedure: ESOPHAGOGASTRODUODENOSCOPY  (EGD) WITH PROPOFOL;  Surgeon: Jonathon Bellows, MD;  Location: [facility] ENDOSCOPY;  Service: Gastroenterology;  Laterality: N/A;   LAPAROSCOPY N/A 03/01/2017   Procedure: LAPAROSCOPY DIAGNOSTIC;  Surgeon: Robert Bellow, MD;  Location: [facility] ORS;  Service: General;  Laterality: N/A;   PORTA CATH INSERTION N/A 08/14/2018   Procedure: PORTA CATH INSERTION;  Surgeon: Algernon Huxley, MD;  Location: [place] CV LAB;  Service: Cardiovascular;  Laterality: N/A;   TONSILECTOMY, ADENOIDECTOMY, BILATERAL MYRINGOTOMY AND TUBES  1955   TONSILLECTOMY     VENTRAL HERNIA REPAIR N/A 03/01/2017   10 x 14 CM Ventralight ST mesh, intraperitoneal location.    VENTRAL HERNIA REPAIR N/A 03/01/2017   Procedure: HERNIA REPAIR VENTRAL ADULT;  Surgeon: Robert Bellow, MD;  Location: [facility] ORS;  Service: General;  Laterality: N/A;    Social History   Socioeconomic History   Marital status: Married    Spouse name: Johnny   Number of children: Not on file   Years of education: Not on file   Highest education level: Not on file  Occupational History   Not on file  Tobacco Use   Smoking status: Former    Packs/day: 2.00    Years: 50.00    Pack years: 100.00    Types: Cigarettes, E-cigarettes    Quit date: 02/07/2012    Years since quitting: 8.5   Smokeless tobacco: Never  Vaping Use   Vaping Use: Some days   Start date: 10/06/2013   Devices: uses no liquid  Substance and Sexual Activity   Alcohol use: No    Alcohol/week: 0.0 standard drinks   Drug use: No   Sexual activity: Yes  Other Topics Concern   Not on file  Social History Narrative   Married   Retired   Clinical cytogeneticist level of education    No children    1 cup of coffee   Social Determinants of Radio broadcast assistant Strain: Low Risk    Difficulty of Paying Living Expenses: Not hard at all  Food Insecurity: No Food Insecurity   Worried About Charity fundraiser in the Last Year: Never true   Arboriculturist in the Last Year: Never  true  Transportation Needs: No Transportation Needs   Lack of Transportation (Medical): No   Lack of Transportation (Non-Medical): No  Physical Activity: Not on file  Stress: No Stress Concern Present   Feeling of Stress : Not at all  Social Connections: Not on file  Intimate Partner Violence: Not At Risk   Fear of Current or Ex-Partner: No   Emotionally Abused: No   Physically Abused: No   Sexually Abused: No    Family History  Problem Relation Age of Onset   Hypertension Mother    Ovarian cancer Mother 75   Heart disease Father    Stroke Father    Ovarian cancer Sister        ? dx cancer had hyst.   Breast cancer Neg Hx      Current Outpatient Medications:    amLODipine (NORVASC) 5 MG tablet, Take 1 tablet (5 mg total) by mouth daily., Disp: 90 tablet, Rfl: 3   BINAXNOW COVID-19 AG HOME TEST KIT, See admin instructions., Disp: , Rfl:    buPROPion (  WELLBUTRIN XL) 300 MG 24 hr tablet, Take 1 tablet by mouth once daily, Disp: 90 tablet, Rfl: 0   clobetasol cream (TEMOVATE) 9.52 %, Apply 1 application topically 2 (two) times daily. Lower back, Disp: 60 g, Rfl: 1   erythromycin ophthalmic ointment, Place 1 application into the right eye daily as needed., Disp: , Rfl:    escitalopram (LEXAPRO) 10 MG tablet, Take 1 tablet by mouth once daily, Disp: 90 tablet, Rfl: 0   folic acid (FOLVITE) 1 MG tablet, Take 1 tablet by mouth once daily, Disp: 90 tablet, Rfl: 0   ibuprofen (ADVIL,MOTRIN) 200 MG tablet, Take 400 mg by mouth every 8 (eight) hours as needed for headache or moderate pain. , Disp: , Rfl:    levothyroxine (SYNTHROID) 150 MCG tablet, TAKE 1 TABLET BY MOUTH ONCE DAILY BEFORE BREAKFAST, Disp: 90 tablet, Rfl: 0   lidocaine-prilocaine (EMLA) cream, Apply to affected area once, Disp: 30 g, Rfl: 3   Multiple Vitamins-Minerals (PRESERVISION AREDS 2) CAPS, Take 1 capsule by mouth 2 (two) times a day. , Disp: , Rfl:    polyethylene glycol (MIRALAX / GLYCOLAX) 17 g packet, Take 17 g  by mouth daily as needed for mild constipation., Disp: 14 each, Rfl: 0   rosuvastatin (CRESTOR) 20 MG tablet, Take 1 tablet by mouth once daily, Disp: 90 tablet, Rfl: 0   valACYclovir (VALTREX) 1000 MG tablet, Take 1 tablet (1,000 mg total) by mouth 3 (three) times daily. With food x 7-10 days, Disp: 30 tablet, Rfl: 0   fluticasone (FLONASE) 50 MCG/ACT nasal spray, Place 2 sprays into both nostrils daily. (Patient not taking: No sig reported), Disp: 16 g, Rfl: 6   loperamide (IMODIUM) 2 MG capsule, Take 1 capsule (2 mg total) by mouth every 2 (two) hours as needed for diarrhea or loose stools. Do not exceed 64m daily. (Patient not taking: No sig reported), Disp: 30 capsule, Rfl: 1   ondansetron (ZOFRAN ODT) 4 MG disintegrating tablet, Take 1 tablet (4 mg total) by mouth every 8 (eight) hours as needed for nausea or vomiting. (Patient not taking: No sig reported), Disp: 20 tablet, Rfl: 0   pantoprazole (PROTONIX) 40 MG tablet, TAKE 1 TABLET BY MOUTH TWICE DAILY BEFORE A MEAL (Patient not taking: No sig reported), Disp: 180 tablet, Rfl: 0 No current facility-administered medications for this visit.  Facility-Administered Medications Ordered in Other Visits:    atezolizumab (TECENTRIQ) 1,200 mg in sodium chloride 0.9 % 250 mL chemo infusion, 1,200 mg, Intravenous, Once, RSindy Guadeloupe MD   denosumab (XGEVA) injection 120 mg, 120 mg, Subcutaneous, Q30 days, RSindy Guadeloupe MD, 120 mg at 03/05/19 1525   heparin lock flush 100 unit/mL, 500 Units, Intravenous, Once, RSindy Guadeloupe MD  Physical exam:  Vitals:   08/12/20 0906  BP: (!) 135/95  Pulse: 80  Resp: 16  Temp: (!) 97.2 F (36.2 C)  TempSrc: Tympanic  SpO2: 97%  Weight: 167 lb (75.8 kg)   Physical Exam Constitutional:      General: She is not in acute distress. Cardiovascular:     Rate and Rhythm: Normal rate and regular rhythm.     Heart sounds: Normal heart sounds.  Pulmonary:     Effort: Pulmonary effort is normal.     Breath  sounds: Normal breath sounds.  Abdominal:     General: Bowel sounds are normal.     Palpations: Abdomen is soft.  Skin:    General: Skin is warm and dry.  Neurological:  Mental Status: She is alert and oriented to person, place, and time.     CMP Latest Ref Rng & Units 08/12/2020  Glucose 70 - 99 mg/dL 101(H)  BUN 8 - 23 mg/dL 17  Creatinine 0.44 - 1.00 mg/dL 0.87  Sodium 135 - 145 mmol/L 140  Potassium 3.5 - 5.1 mmol/L 3.9  Chloride 98 - 111 mmol/L 102  CO2 22 - 32 mmol/L 30  Calcium 8.9 - 10.3 mg/dL 9.2  Total Protein 6.5 - 8.1 g/dL 6.9  Total Bilirubin 0.3 - 1.2 mg/dL 0.5  Alkaline Phos 38 - 126 U/L 106  AST 15 - 41 U/L 27  ALT 0 - 44 U/L 22   CBC Latest Ref Rng & Units 08/12/2020  WBC 4.0 - 10.5 K/uL 4.4  Hemoglobin 12.0 - 15.0 g/dL 12.2  Hematocrit 36.0 - 46.0 % 38.0  Platelets 150 - 400 K/uL 155    No images are attached to the encounter.  MR Brain W Wo Contrast  Result Date: 08/10/2020 CLINICAL DATA:  Small cell lung cancer staging EXAM: MRI HEAD WITHOUT AND WITH CONTRAST TECHNIQUE: Multiplanar, multiecho pulse sequences of the brain and surrounding structures were obtained without and with intravenous contrast. CONTRAST:  7.81m GADAVIST GADOBUTROL 1 MMOL/ML IV SOLN COMPARISON:  07/29/2018 FINDINGS: Brain: No enhancement or swelling to suggest metastatic disease. Age congruent brain volume and white matter appearance. No infarct, hemorrhage, hydrocephalus, or collection. Vascular: Preserved flow voids and vascular enhancements Skull and upper cervical spine: New marrow signal abnormality at the right mandibular head with enhancement of the marrow and adjacent soft tissues. There is accentuated activity in this area by bone scan which has likely developed between February and June 2022. Sinuses/Orbits: No orbital mass or acute finding. Bilateral cataract resection. IMPRESSION: 1. No evidence of intracranial metastasis. 2. Evidence of right mandibular head metastasis which is  new from 2020 brain MRI and possibly new from a February 2022 bone scan. Electronically Signed   By: JMonte FantasiaM.D.   On: 08/10/2020 07:25   NM Bone Scan Whole Body  Result Date: 08/02/2020 CLINICAL DATA:  Small-cell lung cancer restaging. EXAM: NUCLEAR MEDICINE WHOLE BODY BONE SCAN TECHNIQUE: Whole body anterior and posterior images were obtained approximately 3 hours after intravenous injection of radiopharmaceutical. RADIOPHARMACEUTICALS:  21.1 mCi Technetium-971mDP IV COMPARISON:  CT 08/01/2020.  Bone scan 03/31/2020, 01/03/2020. FINDINGS: Bilateral renal function excretion. Multifocal mild areas of increased activity noted throughout the spine, ribs, left humerus, right femur and pelvis again noted. Similar findings noted on prior exam. Findings consistent with stable metastatic disease. No new abnormalities identified. IMPRESSION: Multifocal areas of increased activity as described above. These appears stable inter consistent with stable metastatic disease. No new bony abnormalities identified. Electronically Signed   By: ThMarcello MooresRegister   On: 08/02/2020 10:10   CT CHEST ABDOMEN PELVIS WO CONTRAST  Result Date: 08/01/2020 CLINICAL DATA:  Small cell lung cancer within the right upper lobe. Restaging. EXAM: CT CHEST, ABDOMEN AND PELVIS WITHOUT CONTRAST TECHNIQUE: Multidetector CT imaging of the chest, abdomen and pelvis was performed following the standard protocol without IV contrast. COMPARISON:  03/31/2020 FINDINGS: CT CHEST FINDINGS Cardiovascular: Heart size appears normal. No pericardial effusion. Aortic atherosclerosis. Coronary artery atherosclerotic calcifications.Right chest wall port a catheter is noted with tip at the cavoatrial junction. Mediastinum/Nodes: No enlarged mediastinal, hilar, or axillary lymph nodes. Thyroid gland, trachea, and esophagus demonstrate no significant findings. Lungs/Pleura: No pleural effusion. Centrilobular emphysema. No airspace consolidation, atelectasis  or pneumothorax. 4 mm  nodule within the posteromedial right upper lobe is stable, image 43/4. Subpleural nodule in the posterior right upper lobe measures 3 mm, image 33/4. No new or enlarging lung nodules identified at this time. Musculoskeletal: Sclerotic bone metastases are again noted. This appears unchanged from previous exam. There is a chronic compression fracture involving the T2 vertebral body which appears unchanged with only mild central endplate depression. CT ABDOMEN PELVIS FINDINGS Hepatobiliary: Cirrhotic morphology of the liver. No focal liver abnormality identified. Previous cholecystectomy. No bile duct dilatation. Pancreas: Unremarkable. No pancreatic ductal dilatation or surrounding inflammatory changes. Spleen: Normal in size without focal abnormality. Adrenals/Urinary Tract: Adrenal glands are unremarkable. Kidneys are normal, without renal calculi, focal lesion, or hydronephrosis. Bladder is unremarkable. Stomach/Bowel: Stomach is within normal limits. No evidence of bowel wall thickening, distention, or inflammatory changes. Vascular/Lymphatic: Aortic atherosclerosis. No aneurysm. No abdominopelvic adenopathy. Reproductive: Uterus and bilateral adnexa are unremarkable. Other: No free fluid or fluid collections. Musculoskeletal: Unchanged appearance of diffuse sclerotic bone metastases. Compression deformities involving L2 stable multi level endplate deformities within the lumbar spine. This is most notable at L2. Superior endplate fracture is noted at S1. Anterolisthesis of L4 on L5 is unchanged. IMPRESSION: 1. Stable appearance of diffuse sclerotic bone metastases. 2. Small nonspecific pulmonary nodules are unchanged from previous exam. 3. Morphologic morphology of the liver compatible with cirrhosis. 4. Coronary artery calcifications noted. 5. Emphysema and aortic atherosclerosis. Aortic Atherosclerosis (ICD10-I70.0) and Emphysema (ICD10-J43.9). Electronically Signed   By: Kerby Moors  M.D.   On: 08/01/2020 16:30     Assessment and plan- Patient is a 70 y.o. female with history of extensive stage small cell lung cancer with bone metastases here for on treatment assessment prior to cycle 32 of maintenance Tecentriq  I discussed the results of MRI brain with the patient which did not show any evidence of intracranial metastatic disease but did show Possible new site of metastases involving her right mandibular head.  Her recent bone scan otherwise showed stable areas of disease.  I am therefore not inclined to change her treatment at this time and she will continue Tecentriq until progression or toxicity.  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  I will see her in 3 weeks for cycle 33 of maintenance Tecentriq   Visit Diagnosis 1. Encounter for antineoplastic immunotherapy   2. Malignant neoplasm of unspecified part of unspecified bronchus or lung ([place])   3. Small cell lung cancer, right upper lobe ([place])      Dr. Randa Evens, MD, MPH [facility] at [facility] [phone] 08/12/2020 10:46 AM

## 2020-08-12 NOTE — Patient Instructions (Signed)
[facility]  Discharge Instructions: Thank you for choosing [facility] to provide your oncology and hematology care.  If you have a lab appointment with the [facility], please go directly to the [facility] and check in at the registration area.  Wear comfortable clothing and clothing appropriate for easy access to any Portacath or PICC line.   We strive to give you quality time with your provider. You may need to reschedule your appointment if you arrive late (15 or more minutes).  Arriving late affects you and other patients whose appointments are after yours.  Also, if you miss three or more appointments without notifying the office, you may be dismissed from the clinic at the provider's discretion.      For prescription refill requests, have your pharmacy contact our office and allow 72 hours for refills to be completed.    Today you received the following chemotherapy and/or immunotherapy agents: Tecentriq      To help prevent nausea and vomiting after your treatment, we encourage you to take your nausea medication as directed.  BELOW ARE SYMPTOMS THAT SHOULD BE REPORTED IMMEDIATELY: *FEVER GREATER THAN 100.4 F (38 C) OR HIGHER *CHILLS OR SWEATING *NAUSEA AND VOMITING THAT IS NOT CONTROLLED WITH YOUR NAUSEA MEDICATION *UNUSUAL SHORTNESS OF BREATH *UNUSUAL BRUISING OR BLEEDING *URINARY PROBLEMS (pain or burning when urinating, or frequent urination) *BOWEL PROBLEMS (unusual diarrhea, constipation, pain near the anus) TENDERNESS IN MOUTH AND THROAT WITH OR WITHOUT PRESENCE OF ULCERS (sore throat, sores in mouth, or a toothache) UNUSUAL RASH, SWELLING OR PAIN  UNUSUAL VAGINAL DISCHARGE OR ITCHING   Items with * indicate a potential emergency and should be followed up as soon as possible or go to the Emergency Department if any problems should occur.  Please show the CHEMOTHERAPY ALERT CARD or IMMUNOTHERAPY ALERT CARD at check-in  to the Emergency Department and triage nurse.  Should you have questions after your visit or need to cancel or reschedule your appointment, please contact [facility]  [phone] and follow the prompts.  Office hours are 8:00 a.m. to 4:30 p.m. Monday - Friday. Please note that voicemails left after 4:00 p.m. may not be returned until the following business day.  We are closed weekends and major holidays. You have access to a nurse at all times for urgent questions. Please call the main number to the clinic [phone] and follow the prompts.  For any non-urgent questions, you may also contact your provider using MyChart. We now offer e-Visits for anyone 96 and older to request care online for non-urgent symptoms. For details visit [email or web address].   Also download the MyChart app! Go to the app store, search "MyChart", open the app, select [facility], and log in with your MyChart username and password.  Due to Covid, a mask is required upon entering the hospital/clinic. If you do not have a mask, one will be given to you upon arrival. For doctor visits, patients may have 1 support person aged 10 or older with them. For treatment visits, patients cannot have anyone with them due to current Covid guidelines and our immunocompromised population. Atezolizumab injection What is this medication? ATEZOLIZUMAB (a te zoe LIZ ue mab) is a monoclonal antibody. It is used to treat bladder cancer (urothelial cancer), liver cancer, lung cancer, andmelanoma. This medicine may be used for other purposes; ask your health care provider orpharmacist if you have questions. COMMON BRAND NAME(S): Tecentriq What should I tell  my care team before I take this medication? They need to know if you have any of these conditions: autoimmune diseases like Crohn's disease, ulcerative colitis, or lupus have had or planning to have an allogeneic stem cell transplant (uses someone else's  stem cells) history of organ transplant history of radiation to the chest nervous system problems like myasthenia gravis or Guillain-Barre syndrome an unusual or allergic reaction to atezolizumab, other medicines, foods, dyes, or preservatives pregnant or trying to get pregnant breast-feeding How should I use this medication? This medicine is for infusion into a vein. It is given by a health careprofessional in a hospital or clinic setting. A special MedGuide will be given to you before each treatment. Be sure to readthis information carefully each time. Talk to your pediatrician regarding the use of this medicine in children.Special care may be needed. Overdosage: If you think you have taken too much of this medicine contact apoison control center or emergency room at once. NOTE: This medicine is only for you. Do not share this medicine with others. What if I miss a dose? It is important not to miss your dose. Call your doctor or health careprofessional if you are unable to keep an appointment. What may interact with this medication? Interactions have not been studied. This list may not describe all possible interactions. Give your health care provider a list of all the medicines, herbs, non-prescription drugs, or dietary supplements you use. Also tell them if you smoke, drink alcohol, or use illegaldrugs. Some items may interact with your medicine. What should I watch for while using this medication? Your condition will be monitored carefully while you are receiving thismedicine. You may need blood work done while you are taking this medicine. Do not become pregnant while taking this medicine or for at least 5 months after stopping it. Women should inform their doctor if they wish to become pregnant or think they might be pregnant. There is a potential for serious side effects to an unborn child. Talk to your health care professional or pharmacist for more information. Do not breast-feed an  infant while taking this medicineor for at least 5 months after the last dose. What side effects may I notice from receiving this medication? Side effects that you should report to your doctor or health care professionalas soon as possible: allergic reactions like skin rash, itching or hives, swelling of the face, lips, or tongue black, tarry stools bloody or watery diarrhea breathing problems changes in vision chest pain or chest tightness chills facial flushing fever headache signs and symptoms of high blood sugar such as dizziness; dry mouth; dry skin; fruity breath; nausea; stomach pain; increased hunger or thirst; increased urination signs and symptoms of liver injury like dark yellow or brown urine; general ill feeling or flu-like symptoms; light-colored stools; loss of appetite; nausea; right upper belly pain; unusually weak or tired; yellowing of the eyes or skin stomach pain trouble passing urine or change in the amount of urine Side effects that usually do not require medical attention (report to yourdoctor or health care professional if they continue or are bothersome): bone pain cough diarrhea joint pain muscle pain muscle weakness swelling of arms or legs tiredness weight loss This list may not describe all possible side effects. Call your doctor for medical advice about side effects. You may report side effects to FDA at1-800-FDA-1088. Where should I keep my medication? This drug is given in a hospital or clinic and will not be stored at home. NOTE:  This sheet is a summary. It may not cover all possible information. If you have questions about this medicine, talk to your doctor, pharmacist, orhealth care provider.  2022 Elsevier/Gold Standard (2019-10-22 13:59:34)

## 2020-08-12 NOTE — Progress Notes (Signed)
No new concerns today

## 2020-08-15 ENCOUNTER — Other Ambulatory Visit: Payer: Self-pay | Admitting: Oncology

## 2020-08-16 ENCOUNTER — Other Ambulatory Visit: Payer: PPO

## 2020-08-16 ENCOUNTER — Ambulatory Visit: Payer: PPO

## 2020-08-16 ENCOUNTER — Ambulatory Visit: Payer: PPO | Admitting: Oncology

## 2020-08-22 ENCOUNTER — Other Ambulatory Visit: Payer: Self-pay | Admitting: *Deleted

## 2020-08-24 ENCOUNTER — Other Ambulatory Visit: Payer: Self-pay

## 2020-08-24 ENCOUNTER — Ambulatory Visit (INDEPENDENT_AMBULATORY_CARE_PROVIDER_SITE_OTHER): Payer: PPO | Admitting: Family Medicine

## 2020-08-24 VITALS — BP 120/80 | HR 79 | Temp 98.2°F | Ht 66.0 in | Wt 170.6 lb

## 2020-08-24 DIAGNOSIS — I10 Essential (primary) hypertension: Secondary | ICD-10-CM

## 2020-08-24 DIAGNOSIS — E785 Hyperlipidemia, unspecified: Secondary | ICD-10-CM

## 2020-08-24 DIAGNOSIS — F331 Major depressive disorder, recurrent, moderate: Secondary | ICD-10-CM | POA: Diagnosis not present

## 2020-08-24 DIAGNOSIS — I700 Atherosclerosis of aorta: Secondary | ICD-10-CM | POA: Insufficient documentation

## 2020-08-24 DIAGNOSIS — C3411 Malignant neoplasm of upper lobe, right bronchus or lung: Secondary | ICD-10-CM | POA: Diagnosis not present

## 2020-08-24 DIAGNOSIS — R413 Other amnesia: Secondary | ICD-10-CM | POA: Diagnosis not present

## 2020-08-24 LAB — LIPID PANEL
Cholesterol: 177 mg/dL (ref 0–200)
HDL: 54.9 mg/dL (ref >39.00)
LDL Cholesterol: 82 mg/dL (ref 0–99)
NonHDL: 121.64
Total CHOL/HDL Ratio: 3
Triglycerides: 198 mg/dL — ABNORMAL HIGH (ref 0.0–149.0)
VLDL: 39.6 mg/dL (ref 0.0–40.0)

## 2020-08-24 MED ORDER — ESCITALOPRAM OXALATE 20 MG PO TABS: 20.0000 mg | ORAL_TABLET | Freq: Every day | ORAL | 1 refills | Status: DC

## 2020-08-24 NOTE — Assessment & Plan Note (Signed)
Continues to have some symptoms.  We will increase her Lexapro to 20 mg once daily.  She will monitor for worsening depression.  Advised to seek medical attention for suicidal thoughts.

## 2020-08-24 NOTE — Assessment & Plan Note (Signed)
Continue risk factor management.

## 2020-08-24 NOTE — Assessment & Plan Note (Signed)
Well-controlled.  She will continue amlodipine 5 mg once daily. 

## 2020-08-24 NOTE — Assessment & Plan Note (Signed)
Relatively stable based on recent evaluation with oncology.  She will continue to follow with them for treatment.  I advised her decreased appetite is likely related to the medication used to treat her cancer.

## 2020-08-24 NOTE — Patient Instructions (Signed)
Nice to see you. We are going to increase your Lexapro to 20 mg once daily. Depression worsens please let us know.

## 2020-08-24 NOTE — Assessment & Plan Note (Signed)
Check lipid panel.  Continue Crestor 20 mg once daily.

## 2020-08-24 NOTE — Progress Notes (Signed)
Tommi Rumps, MD Phone: [phone]  Anne [name] is a 70 y.o. female who presents today for f/u.  HYPERTENSION Disease Monitoring Home BP Monitoring not checking Chest pain- no    Dyspnea- no Medications Compliance-  taking amlodipine.   Edema- no  Small cell lung cancer: This has generally been stable under treatment with oncology.  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  She notes some decreased appetite though otherwise no significant side effects from the treatment.  Depression: Patient notes she does feel depressed at times.  She is tired of being home by herself.  She notes the Lexapro has helped some.  No SI.  Memory difficulty: Patient feels as though this is stable.  Sometimes she forgets what she does during the day.  She notes no ADL issues.  She does still manage the finances at home.  Social History   Tobacco Use  Smoking Status Former   Packs/day: 2.00   Years: 50.00   Pack years: 100.00   Types: Cigarettes, E-cigarettes   Quit date: 02/07/2012   Years since quitting: 8.5  Smokeless Tobacco Never    Current Outpatient Medications on File Prior to Visit  Medication Sig Dispense Refill   amLODipine (NORVASC) 5 MG tablet Take 1 tablet (5 mg total) by mouth daily. 90 tablet 3   buPROPion (WELLBUTRIN XL) 300 MG 24 hr tablet Take 1 tablet by mouth once daily 90 tablet 0   clobetasol cream (TEMOVATE) 7.03 % Apply 1 application topically 2 (two) times daily. Lower back 60 g 1   erythromycin ophthalmic ointment Place 1 application into the right eye daily as needed.     fluticasone (FLONASE) 50 MCG/ACT nasal spray Place 2 sprays into both nostrils daily. 16 g 6   folic acid (FOLVITE) 1 MG tablet Take 1 tablet by mouth once daily 90 tablet 0   ibuprofen (ADVIL,MOTRIN) 200 MG tablet Take 400 mg by mouth every 8 (eight) hours as needed for  headache or moderate pain.      levothyroxine (SYNTHROID) 150 MCG tablet TAKE 1 TABLET BY MOUTH ONCE DAILY BEFORE BREAKFAST 90 tablet 0   lidocaine-prilocaine (EMLA) cream Apply to affected area once 30 g 3   loperamide (IMODIUM) 2 MG capsule Take 1 capsule (2 mg total) by mouth every 2 (two) hours as needed for diarrhea or loose stools. Do not exceed 16mg  daily. 30 capsule 1   Multiple Vitamins-Minerals (PRESERVISION AREDS 2) CAPS Take 1 capsule by mouth 2 (two) times a day.      ondansetron (ZOFRAN ODT) 4 MG disintegrating tablet Take 1 tablet (4 mg total) by mouth every 8 (eight) hours as needed for nausea or vomiting. 20 tablet 0   pantoprazole (PROTONIX) 40 MG tablet TAKE 1 TABLET BY MOUTH TWICE DAILY BEFORE A MEAL 180 tablet 0   polyethylene glycol (MIRALAX / GLYCOLAX) 17 g packet Take 17 g by mouth daily as needed for mild constipation. 14 each 0   rosuvastatin (CRESTOR) 20 MG tablet Take 1 tablet by mouth once daily 90 tablet 0   valACYclovir (VALTREX) 1000 MG tablet Take 1 tablet (1,000 mg total) by mouth 3 (three) times daily. With food x 7-10 days 30 tablet 0   Current Facility-Administered Medications on File Prior to Visit  Medication Dose Route Frequency Provider Last Rate Last Admin   denosumab (XGEVA) injection 120 mg  120 mg Subcutaneous Q30 days Sindy Guadeloupe, MD   120 mg at 03/05/19 1525     ROS see history of present illness  Objective  Physical Exam Vitals:   08/24/20 0947  BP: 120/80  Pulse: 79  Temp: 98.2 F (36.8 C)  SpO2: 97%    BP Readings from Last 3 Encounters:  08/24/20 120/80  08/12/20 (!) 135/95  07/22/20 113/68   Wt Readings from Last 3 Encounters:  08/24/20 170 lb 9.6 oz (77.4 kg)  08/12/20 167 lb (75.8 kg)  07/22/20 163 lb 12.8 oz (74.3 kg)    Physical Exam Constitutional:      General: She is not in acute distress.    Appearance: She is not diaphoretic.  Cardiovascular:     Rate and Rhythm: Normal rate and regular rhythm.     Heart  sounds: Normal heart sounds.  Pulmonary:     Effort: Pulmonary effort is normal.     Breath sounds: Normal breath sounds.  Skin:    General: Skin is warm and dry.  Neurological:     Mental Status: She is alert.     Assessment/Plan: Please see individual problem list.  Problem List Items Addressed This Visit     Aortic atherosclerosis ([place])    Continue risk factor management.       Benign essential HTN - Primary    Well-controlled.  She will continue amlodipine 5 mg once daily.       Depression    Continues to have some symptoms.  We will increase her Lexapro to 20 mg once daily.  She will monitor for worsening depression.  Advised to seek medical attention for suicidal thoughts.       Relevant Medications   escitalopram (LEXAPRO) 20 MG tablet   HLD (hyperlipidemia)    Check lipid panel.  Continue Crestor 20 mg once daily.       Relevant Orders   Lipid panel   Memory difficulty    Generally stable.  No ADL issues.  Depression may be playing a role.  We will treat that and see how she progresses.       Small cell lung cancer, right upper lobe ([place])    Relatively stable based on recent evaluation with oncology.  She will continue to follow with them for treatment.  I advised her decreased appetite is likely related to the medication used to treat her cancer.         Health Maintenance: I encouraged the patient to get the Shingrix vaccine at the pharmacy.  Return in about 2 months (around 10/25/2020) for Depression.  This visit occurred during the SARS-CoV-2 public health emergency.  Safety protocols were in place, including screening questions prior to the visit, additional usage of staff PPE, and extensive cleaning of exam room while observing appropriate contact time as indicated for disinfecting solutions.    Tommi Rumps, MD [facility]

## 2020-08-24 NOTE — Assessment & Plan Note (Signed)
Generally stable.  No ADL issues.  Depression may be playing a role.  We will treat that and see how she progresses.

## 2020-09-02 ENCOUNTER — Ambulatory Visit: Payer: PPO | Admitting: Oncology

## 2020-09-02 ENCOUNTER — Ambulatory Visit: Payer: PPO

## 2020-09-02 ENCOUNTER — Other Ambulatory Visit: Payer: PPO

## 2020-09-05 ENCOUNTER — Encounter: Payer: Self-pay | Admitting: Oncology

## 2020-09-05 ENCOUNTER — Other Ambulatory Visit: Payer: Self-pay

## 2020-09-05 ENCOUNTER — Inpatient Hospital Stay: Payer: PPO | Attending: Oncology | Admitting: Oncology

## 2020-09-05 ENCOUNTER — Inpatient Hospital Stay: Payer: PPO

## 2020-09-05 VITALS — BP 119/78 | HR 80 | Temp 96.5°F | Resp 20 | Wt 173.8 lb

## 2020-09-05 VITALS — BP 134/78 | HR 81

## 2020-09-05 DIAGNOSIS — Z79890 Hormone replacement therapy (postmenopausal): Secondary | ICD-10-CM | POA: Insufficient documentation

## 2020-09-05 DIAGNOSIS — Z9049 Acquired absence of other specified parts of digestive tract: Secondary | ICD-10-CM | POA: Diagnosis not present

## 2020-09-05 DIAGNOSIS — Z923 Personal history of irradiation: Secondary | ICD-10-CM | POA: Diagnosis not present

## 2020-09-05 DIAGNOSIS — E063 Autoimmune thyroiditis: Secondary | ICD-10-CM | POA: Diagnosis not present

## 2020-09-05 DIAGNOSIS — Z5112 Encounter for antineoplastic immunotherapy: Secondary | ICD-10-CM | POA: Insufficient documentation

## 2020-09-05 DIAGNOSIS — Z8041 Family history of malignant neoplasm of ovary: Secondary | ICD-10-CM | POA: Insufficient documentation

## 2020-09-05 DIAGNOSIS — Z79899 Other long term (current) drug therapy: Secondary | ICD-10-CM | POA: Insufficient documentation

## 2020-09-05 DIAGNOSIS — E785 Hyperlipidemia, unspecified: Secondary | ICD-10-CM | POA: Insufficient documentation

## 2020-09-05 DIAGNOSIS — I10 Essential (primary) hypertension: Secondary | ICD-10-CM | POA: Insufficient documentation

## 2020-09-05 DIAGNOSIS — E669 Obesity, unspecified: Secondary | ICD-10-CM | POA: Diagnosis not present

## 2020-09-05 DIAGNOSIS — C3490 Malignant neoplasm of unspecified part of unspecified bronchus or lung: Secondary | ICD-10-CM

## 2020-09-05 DIAGNOSIS — C3411 Malignant neoplasm of upper lobe, right bronchus or lung: Secondary | ICD-10-CM

## 2020-09-05 DIAGNOSIS — Z87891 Personal history of nicotine dependence: Secondary | ICD-10-CM | POA: Diagnosis not present

## 2020-09-05 DIAGNOSIS — R5383 Other fatigue: Secondary | ICD-10-CM | POA: Insufficient documentation

## 2020-09-05 DIAGNOSIS — K7460 Unspecified cirrhosis of liver: Secondary | ICD-10-CM | POA: Diagnosis not present

## 2020-09-05 DIAGNOSIS — C7951 Secondary malignant neoplasm of bone: Secondary | ICD-10-CM | POA: Insufficient documentation

## 2020-09-05 LAB — CBC WITH DIFFERENTIAL/PLATELET
Abs Immature Granulocytes: 0.01 10*3/uL (ref 0.00–0.07)
Basophils Absolute: 0 10*3/uL (ref 0.0–0.1)
Basophils Relative: 1 %
Eosinophils Absolute: 0 10*3/uL (ref 0.0–0.5)
Eosinophils Relative: 0 %
HCT: 37.6 % (ref 36.0–46.0)
Hemoglobin: 11.7 g/dL — ABNORMAL LOW (ref 12.0–15.0)
Immature Granulocytes: 0 %
Lymphocytes Relative: 23 %
Lymphs Abs: 0.9 10*3/uL (ref 0.7–4.0)
MCH: 27.6 pg (ref 26.0–34.0)
MCHC: 31.1 g/dL (ref 30.0–36.0)
MCV: 88.7 fL (ref 80.0–100.0)
Monocytes Absolute: 0.3 10*3/uL (ref 0.1–1.0)
Monocytes Relative: 9 %
Neutro Abs: 2.5 10*3/uL (ref 1.7–7.7)
Neutrophils Relative %: 67 %
Platelets: 154 10*3/uL (ref 150–400)
RBC: 4.24 MIL/uL (ref 3.87–5.11)
RDW: 15.4 % (ref 11.5–15.5)
WBC: 3.7 10*3/uL — ABNORMAL LOW (ref 4.0–10.5)
nRBC: 0 % (ref 0.0–0.2)

## 2020-09-05 LAB — COMPREHENSIVE METABOLIC PANEL
ALT: 20 U/L (ref 0–44)
AST: 27 U/L (ref 15–41)
Albumin: 3.6 g/dL (ref 3.5–5.0)
Alkaline Phosphatase: 97 U/L (ref 38–126)
Anion gap: 10 (ref 5–15)
BUN: 16 mg/dL (ref 8–23)
CO2: 30 mmol/L (ref 22–32)
Calcium: 9 mg/dL (ref 8.9–10.3)
Chloride: 101 mmol/L (ref 98–111)
Creatinine, Ser: 0.83 mg/dL (ref 0.44–1.00)
GFR, Estimated: >60 mL/min (ref >60)
Glucose, Bld: 106 mg/dL — ABNORMAL HIGH (ref 70–99)
Potassium: 4.1 mmol/L (ref 3.5–5.1)
Sodium: 141 mmol/L (ref 135–145)
Total Bilirubin: 0.2 mg/dL — ABNORMAL LOW (ref 0.3–1.2)
Total Protein: 7 g/dL (ref 6.5–8.1)

## 2020-09-05 LAB — TSH: TSH: <0.01 u[IU]/mL — ABNORMAL LOW (ref 0.350–4.500)

## 2020-09-05 MED ORDER — HEPARIN SOD (PORK) LOCK FLUSH 100 UNIT/ML IV SOLN
500.0000 [IU] | Freq: Once | INTRAVENOUS | Status: AC
  Administered 2020-09-05: 500 [IU] via INTRAVENOUS
  Filled 2020-09-05: qty 5

## 2020-09-05 MED ORDER — SODIUM CHLORIDE 0.9% FLUSH
10.0000 mL | Freq: Once | INTRAVENOUS | Status: AC
  Administered 2020-09-05: 10 mL via INTRAVENOUS
  Filled 2020-09-05: qty 10

## 2020-09-05 MED ORDER — SODIUM CHLORIDE 0.9 % IV SOLN
Freq: Once | INTRAVENOUS | Status: AC
  Filled 2020-09-05: qty 250

## 2020-09-05 MED ORDER — SODIUM CHLORIDE 0.9 % IV SOLN
1200.0000 mg | Freq: Once | INTRAVENOUS | Status: AC
  Administered 2020-09-05: 1200 mg via INTRAVENOUS
  Filled 2020-09-05: qty 20

## 2020-09-05 NOTE — Progress Notes (Signed)
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  She has been referred to [place] for findings of bone metastases and her recent MRI.  She has a prior history of 3 packs/day day smoking for over 45 years and quit smoking 5 years ago.She had a CT chest abdomen pelvis in 2018 which showed a 5 mm lung nodule in the left lower lobe.  Recently over the last 2 months patient has been having worsening back pain and was referred to orthopedics.  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  Pathologic fracture of S1.   PET scan showed 2 RUL lung nodules, hilar and mediastinal adenopathy and widespread bone mets. MRI brain negative.   Patient completed palliative RT to her spine. Bronchoscopy showed small cell lung cancer.  Palliative carboplatin, etoposide and Tecentriq started on 08/18/2018. Scans after 4 cycles showed stable disease. She is on maintenance tecentriq    Interval history-patient reports doing well overall.  She is trying to improve her oral intake and has put on 3 pounds.  Denies any pain presently  ECOG PS- 1 Pain scale-  0  Review of systems- Review of Systems  Constitutional:  Negative for chills, fever, malaise/fatigue and weight loss.  HENT:  Negative for congestion, ear discharge and nosebleeds.   Eyes:  Negative for blurred vision.  Respiratory:  Negative for cough, hemoptysis, sputum production, shortness of breath and wheezing.   Cardiovascular:  Negative for chest pain, palpitations, orthopnea and claudication.  Gastrointestinal:  Negative for abdominal pain, blood in stool, constipation, diarrhea, heartburn, melena, nausea and vomiting.  Genitourinary:  Negative for dysuria, flank pain, frequency, hematuria and urgency.  Musculoskeletal:  Negative for back pain, joint pain and myalgias.  Skin:  Negative for rash.  Neurological:  Negative for dizziness, tingling, focal weakness, seizures, weakness and headaches.  Endo/Heme/Allergies:  Does not bruise/bleed easily.  Psychiatric/Behavioral:  Negative for depression and suicidal ideas. The patient does not have insomnia.      Allergies  Allergen Reactions   Bee Venom Swelling     Past Medical History:  Diagnosis Date   Arthritis    Cirrhosis ([place])    Depression    GERD (gastroesophageal reflux disease)    Hyperlipidemia    Hypertension    Lung cancer ([place])    Metastatic bone cancer ([place])      Past Surgical History:  Procedure Laterality Date   APPENDECTOMY  1971   CHOLECYSTECTOMY  1971   COLONOSCOPY WITH PROPOFOL N/A 11/15/2016   Procedure: COLONOSCOPY WITH PROPOFOL;  Surgeon: Jonathon Bellows, MD;  Location[facility] ENDOSCOPY;  Service: Gastroenterology;  Laterality: N/A;   ENDOBRONCHIAL ULTRASOUND Right 07/30/2018   Procedure: ENDOBRONCHIAL ULTRASOUND;  Surgeon: Laverle Hobby, MD;  Location: [facility] ORS;  Service: Pulmonary;  Laterality: Right;   ESOPHAGOGASTRODUODENOSCOPY (EGD) WITH PROPOFOL N/A 01/07/2017   Procedure: ESOPHAGOGASTRODUODENOSCOPY (EGD) WITH PROPOFOL;  Surgeon: Jonathon Bellows, MD;  Location: [facility] ENDOSCOPY;  Service:  Gastroenterology;  Laterality: N/A;   LAPAROSCOPY N/A 03/01/2017   Procedure: LAPAROSCOPY DIAGNOSTIC;  Surgeon: Robert Bellow, MD;  Location: [facility] ORS;  Service: General;  Laterality: N/A;   PORTA CATH INSERTION N/A 08/14/2018   Procedure: PORTA CATH INSERTION;  Surgeon: Algernon Huxley, MD;  Location: [place] CV LAB;  Service: Cardiovascular;  Laterality: N/A;   TONSILECTOMY, ADENOIDECTOMY, BILATERAL MYRINGOTOMY AND TUBES  1955   TONSILLECTOMY     VENTRAL HERNIA REPAIR N/A 03/01/2017   10 x 14 CM Ventralight ST mesh, intraperitoneal location.    VENTRAL HERNIA REPAIR N/A 03/01/2017   Procedure: HERNIA REPAIR VENTRAL ADULT;  Surgeon: Robert Bellow, MD;  Location: [facility] ORS;  Service: General;  Laterality: N/A;    Social History   Socioeconomic History   Marital status: Married    Spouse name: Johnny   Number of children: Not on file   Years of education: Not on file   Highest education level: Not on file  Occupational History   Not on file  Tobacco Use   Smoking status: Former    Packs/day: 2.00    Years: 50.00    Pack years: 100.00    Types: Cigarettes, E-cigarettes    Quit date: 02/07/2012    Years since quitting: 8.5   Smokeless tobacco: Never  Vaping Use   Vaping Use: Some days   Start date: 10/06/2013   Devices: uses no liquid  Substance and Sexual Activity   Alcohol use: No    Alcohol/week: 0.0 standard drinks   Drug use: No   Sexual activity: Yes  Other Topics Concern   Not on file  Social History Narrative   Married   Retired   Clinical cytogeneticist level of education    No children    1 cup of coffee   Social Determinants of Radio broadcast assistant Strain: Low Risk    Difficulty of Paying Living Expenses: Not hard at all  Food Insecurity: No Food Insecurity   Worried About Charity fundraiser in the Last Year: Never true   Arboriculturist in the Last Year: Never true  Transportation Needs: No Transportation Needs   Lack of Transportation  (Medical): No   Lack of Transportation (Non-Medical): No  Physical Activity: Not on file  Stress: No Stress Concern Present   Feeling of Stress : Not at all  Social Connections: Not on file  Intimate Partner Violence: Not At Risk   Fear of Current or Ex-Partner: No   Emotionally Abused: No   Physically Abused: No   Sexually Abused: No    Family History  Problem Relation Age of Onset   Hypertension Mother    Ovarian cancer Mother 56   Heart disease Father    Stroke Father    Ovarian cancer Sister        ? dx cancer had hyst.   Breast cancer Neg Hx      Current Outpatient Medications:    amLODipine (NORVASC) 5 MG tablet, Take 1 tablet (5 mg total) by mouth daily., Disp: 90 tablet, Rfl: 3   buPROPion (WELLBUTRIN XL) 300 MG 24 hr tablet, Take 1 tablet by mouth once daily, Disp: 90 tablet, Rfl: 0   clobetasol cream (TEMOVATE) 3.01 %, Apply 1 application topically  2 (two) times daily. Lower back, Disp: 60 g, Rfl: 1   erythromycin ophthalmic ointment, Place 1 application into the right eye daily as needed., Disp: , Rfl:    escitalopram (LEXAPRO) 20 MG tablet, Take 1 tablet (20 mg total) by mouth daily., Disp: 90 tablet, Rfl: 1   fluticasone (FLONASE) 50 MCG/ACT nasal spray, Place 2 sprays into both nostrils daily., Disp: 16 g, Rfl: 6   folic acid (FOLVITE) 1 MG tablet, Take 1 tablet by mouth once daily, Disp: 90 tablet, Rfl: 0   ibuprofen (ADVIL,MOTRIN) 200 MG tablet, Take 400 mg by mouth every 8 (eight) hours as needed for headache or moderate pain. , Disp: , Rfl:    levothyroxine (SYNTHROID) 150 MCG tablet, TAKE 1 TABLET BY MOUTH ONCE DAILY BEFORE BREAKFAST, Disp: 90 tablet, Rfl: 0   lidocaine-prilocaine (EMLA) cream, Apply to affected area once, Disp: 30 g, Rfl: 3   loperamide (IMODIUM) 2 MG capsule, Take 1 capsule (2 mg total) by mouth every 2 (two) hours as needed for diarrhea or loose stools. Do not exceed 16mg  daily., Disp: 30 capsule, Rfl: 1   Multiple Vitamins-Minerals  (PRESERVISION AREDS 2) CAPS, Take 1 capsule by mouth 2 (two) times a day. , Disp: , Rfl:    ondansetron (ZOFRAN ODT) 4 MG disintegrating tablet, Take 1 tablet (4 mg total) by mouth every 8 (eight) hours as needed for nausea or vomiting., Disp: 20 tablet, Rfl: 0   pantoprazole (PROTONIX) 40 MG tablet, TAKE 1 TABLET BY MOUTH TWICE DAILY BEFORE A MEAL, Disp: 180 tablet, Rfl: 0   polyethylene glycol (MIRALAX / GLYCOLAX) 17 g packet, Take 17 g by mouth daily as needed for mild constipation., Disp: 14 each, Rfl: 0   rosuvastatin (CRESTOR) 20 MG tablet, Take 1 tablet by mouth once daily, Disp: 90 tablet, Rfl: 0   valACYclovir (VALTREX) 1000 MG tablet, Take 1 tablet (1,000 mg total) by mouth 3 (three) times daily. With food x 7-10 days, Disp: 30 tablet, Rfl: 0 No current facility-administered medications for this visit.  Facility-Administered Medications Ordered in Other Visits:    atezolizumab (TECENTRIQ) 1,200 mg in sodium chloride 0.9 % 250 mL chemo infusion, 1,200 mg, Intravenous, Once, Sindy Guadeloupe, MD, Last Rate: 540 mL/hr at 09/05/20 1037, 1,200 mg at 09/05/20 1037   denosumab (XGEVA) injection 120 mg, 120 mg, Subcutaneous, Q30 days, Sindy Guadeloupe, MD, 120 mg at 03/05/19 1525   heparin lock flush 100 unit/mL, 500 Units, Intravenous, Once, Sindy Guadeloupe, MD  Physical exam:  Vitals:   09/05/20 0943  BP: 119/78  Pulse: 80  Resp: 20  Temp: (!) 96.5 F (35.8 C)  TempSrc: Tympanic  SpO2: 98%  Weight: 173 lb 12.8 oz (78.8 kg)   Physical Exam Constitutional:      General: She is not in acute distress. Cardiovascular:     Rate and Rhythm: Normal rate and regular rhythm.     Heart sounds: Normal heart sounds.  Pulmonary:     Effort: Pulmonary effort is normal.     Breath sounds: Normal breath sounds.  Abdominal:     General: Bowel sounds are normal.     Palpations: Abdomen is soft.  Skin:    General: Skin is warm and dry.  Neurological:     Mental Status: She is alert and oriented to  person, place, and time.     CMP Latest Ref Rng & Units 09/05/2020  Glucose 70 - 99 mg/dL 106(H)  BUN 8 - 23 mg/dL  16  Creatinine 0.44 - 1.00 mg/dL 0.83  Sodium 135 - 145 mmol/L 141  Potassium 3.5 - 5.1 mmol/L 4.1  Chloride 98 - 111 mmol/L 101  CO2 22 - 32 mmol/L 30  Calcium 8.9 - 10.3 mg/dL 9.0  Total Protein 6.5 - 8.1 g/dL 7.0  Total Bilirubin 0.3 - 1.2 mg/dL 0.2(L)  Alkaline Phos 38 - 126 U/L 97  AST 15 - 41 U/L 27  ALT 0 - 44 U/L 20   CBC Latest Ref Rng & Units 09/05/2020  WBC 4.0 - 10.5 K/uL 3.7(L)  Hemoglobin 12.0 - 15.0 g/dL 11.7(L)  Hematocrit 36.0 - 46.0 % 37.6  Platelets 150 - 400 K/uL 154    No images are attached to the encounter.  MR Brain W Wo Contrast  Result Date: 08/10/2020 CLINICAL DATA:  Small cell lung cancer staging EXAM: MRI HEAD WITHOUT AND WITH CONTRAST TECHNIQUE: Multiplanar, multiecho pulse sequences of the brain and surrounding structures were obtained without and with intravenous contrast. CONTRAST:  7.35mL GADAVIST GADOBUTROL 1 MMOL/ML IV SOLN COMPARISON:  07/29/2018 FINDINGS: Brain: No enhancement or swelling to suggest metastatic disease. Age congruent brain volume and white matter appearance. No infarct, hemorrhage, hydrocephalus, or collection. Vascular: Preserved flow voids and vascular enhancements Skull and upper cervical spine: New marrow signal abnormality at the right mandibular head with enhancement of the marrow and adjacent soft tissues. There is accentuated activity in this area by bone scan which has likely developed between February and June 2022. Sinuses/Orbits: No orbital mass or acute finding. Bilateral cataract resection. IMPRESSION: 1. No evidence of intracranial metastasis. 2. Evidence of right mandibular head metastasis which is new from 2020 brain MRI and possibly new from a February 2022 bone scan. Electronically Signed   By: Monte Fantasia M.D.   On: 08/10/2020 07:25     Assessment and plan- Patient is a 70 y.o. female with history of  extensive stage small cell lung cancer and bone metastases here for on treatment assessment prior to cycle 33 of maintenance Tecentriq  Counts okay to proceed with cycle 23 of maintenance Tecentriq today.  I will see her back in 3 weeks for cycle 24.  Plan is to continue Tecentriq until progression or toxicity.  She has been on maintenance Tecentriq for over 2 years now with stable disease and has done remarkably well.  Bone metastases: She had received bisphosphonates in the past but developed severe hypocalcemia requiring IV calcium and therefore has been on hold.  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  TSH currently pending   Visit Diagnosis 1. Encounter for antineoplastic immunotherapy   2. Small cell lung cancer, right upper lobe ([place])   3. Bone metastases ([place])   4. Autoimmune hypothyroidism      Dr. Randa Evens, MD, MPH [facility] [phone] 09/05/2020 10:52 AM

## 2020-09-05 NOTE — Patient Instructions (Signed)
[facility] [place] [facility]  Discharge Instructions: Thank you for choosing [facility] to provide your oncology and hematology care.  If you have a lab appointment with the Cancer Center, please go directly to the Cancer Center and check in at the registration area.  Wear comfortable clothing and clothing appropriate for easy access to any Portacath or PICC line.   We strive to give you quality time with your provider. You may need to reschedule your appointment if you arrive late (15 or more minutes).  Arriving late affects you and other patients whose appointments are after yours.  Also, if you miss three or more appointments without notifying the office, you may be dismissed from the clinic at the provider's discretion.      For prescription refill requests, have your pharmacy contact our office and allow 72 hours for refills to be completed.      To help prevent nausea and vomiting after your treatment, we encourage you to take your nausea medication as directed.  BELOW ARE SYMPTOMS THAT SHOULD BE REPORTED IMMEDIATELY: *FEVER GREATER THAN 100.4 F (38 C) OR HIGHER *CHILLS OR SWEATING *NAUSEA AND VOMITING THAT IS NOT CONTROLLED WITH YOUR NAUSEA MEDICATION *UNUSUAL SHORTNESS OF BREATH *UNUSUAL BRUISING OR BLEEDING *URINARY PROBLEMS (pain or burning when urinating, or frequent urination) *BOWEL PROBLEMS (unusual diarrhea, constipation, pain near the anus) TENDERNESS IN MOUTH AND THROAT WITH OR WITHOUT PRESENCE OF ULCERS (sore throat, sores in mouth, or a toothache) UNUSUAL RASH, SWELLING OR PAIN  UNUSUAL VAGINAL DISCHARGE OR ITCHING   Items with * indicate a potential emergency and should be followed up as soon as possible or go to the Emergency Department if any problems should occur.  Please show the CHEMOTHERAPY ALERT CARD or IMMUNOTHERAPY ALERT CARD at check-in to the Emergency Department and triage nurse.  Should you have questions after your  visit or need to cancel or reschedule your appointment, please contact [facility] [place] [facility]  [phone] and follow the prompts.  Office hours are 8:00 a.m. to 4:30 p.m. Monday - Friday. Please note that voicemails left after 4:00 p.m. may not be returned until the following business day.  We are closed weekends and major holidays. You have access to a nurse at all times for urgent questions. Please call the main number to the clinic [phone] and follow the prompts.  For any non-urgent questions, you may also contact your provider using MyChart. We now offer e-Visits for anyone 18 and older to request care online for non-urgent symptoms. For details visit [email or web address].   Also download the MyChart app! Go to the app store, search "MyChart", open the app, select [place], and log in with your MyChart username and password.  Due to Covid, a mask is required upon entering the hospital/clinic. If you do not have a mask, one will be given to you upon arrival. For doctor visits, patients may have 1 support person aged 18 or older with them. For treatment visits, patients cannot have anyone with them due to current Covid guidelines and our immunocompromised population.  

## 2020-09-06 ENCOUNTER — Encounter: Payer: Self-pay | Admitting: Oncology

## 2020-09-06 DIAGNOSIS — E039 Hypothyroidism, unspecified: Secondary | ICD-10-CM | POA: Diagnosis not present

## 2020-09-08 ENCOUNTER — Ambulatory Visit (INDEPENDENT_AMBULATORY_CARE_PROVIDER_SITE_OTHER): Payer: PPO

## 2020-09-08 VITALS — Ht 66.0 in | Wt 173.0 lb

## 2020-09-08 DIAGNOSIS — Z0000 Encounter for general adult medical examination without abnormal findings: Secondary | ICD-10-CM | POA: Diagnosis not present

## 2020-09-08 NOTE — Patient Instructions (Addendum)
  Anne [name] , Thank you for taking time to come for your Medicare Wellness Visit. I appreciate your ongoing commitment to your health goals. Please review the following plan we discussed and let me know if I can assist you in the future.   These are the goals we discussed:  Goals       Patient Stated     Maintain Healthy Lifestyle (pt-stated)      Stay active Healthy diet        This is a list of the screening recommended for you and due dates:  Health Maintenance  Topic Date Due   Mammogram  03/08/2017   Flu Shot  09/05/2020   COVID-19 Vaccine (4 - Booster for Pfizer series) 09/24/2020*   Colon Cancer Screening  11/15/2021   Tetanus Vaccine  12/07/2027   DEXA scan (bone density measurement)  Completed   Hepatitis C Screening: USPSTF Recommendation to screen - Ages 47-79 yo.  Completed   Pneumonia vaccines  Completed   HPV Vaccine  Aged Out   Zoster (Shingles) Vaccine  Discontinued  *Topic was postponed. The date shown is not the original due date.

## 2020-09-08 NOTE — Progress Notes (Signed)
Subjective:   Anne [name] is a 70 y.o. female who presents for Medicare Annual (Subsequent) preventive examination.  Review of Systems    No ROS.  Medicare Wellness Virtual Visit.  Visual/audio telehealth visit, UTA vital signs.   See social history for additional risk factors.   Cardiac Risk Factors include: advanced age (>28men, >39 women);hypertension     Objective:    Today's Vitals   09/08/20 1233  Weight: 173 lb (78.5 kg)  Height: 5\' 6"  (1.676 m)   Body mass index is 27.92 kg/m.  Advanced Directives 09/08/2020 07/22/2020 07/01/2020 06/10/2020 05/20/2020 05/02/2020 03/10/2020  Does Patient Have a Medical Advance Directive? No No No No No No -  Would patient like information on creating a medical advance directive? No - Patient declined No - Patient declined - No - Patient declined No - Patient declined - No - Patient declined    Current Medications (verified) Outpatient Encounter Medications as of 09/08/2020  Medication Sig   amLODipine (NORVASC) 5 MG tablet Take 1 tablet (5 mg total) by mouth daily.   buPROPion (WELLBUTRIN XL) 300 MG 24 hr tablet Take 1 tablet by mouth once daily   clobetasol cream (TEMOVATE) 5.40 % Apply 1 application topically 2 (two) times daily. Lower back   erythromycin ophthalmic ointment Place 1 application into the right eye daily as needed.   escitalopram (LEXAPRO) 20 MG tablet Take 1 tablet (20 mg total) by mouth daily.   fluticasone (FLONASE) 50 MCG/ACT nasal spray Place 2 sprays into both nostrils daily.   folic acid (FOLVITE) 1 MG tablet Take 1 tablet by mouth once daily   ibuprofen (ADVIL,MOTRIN) 200 MG tablet Take 400 mg by mouth every 8 (eight) hours as needed for headache or moderate pain.    levothyroxine (SYNTHROID) 150 MCG tablet TAKE 1 TABLET BY MOUTH ONCE DAILY BEFORE BREAKFAST   lidocaine-prilocaine (EMLA) cream Apply to affected area once   loperamide (IMODIUM) 2 MG capsule Take 1 capsule (2 mg total) by mouth every 2 (two) hours as  needed for diarrhea or loose stools. Do not exceed 16mg  daily.   Multiple Vitamins-Minerals (PRESERVISION AREDS 2) CAPS Take 1 capsule by mouth 2 (two) times a day.    ondansetron (ZOFRAN ODT) 4 MG disintegrating tablet Take 1 tablet (4 mg total) by mouth every 8 (eight) hours as needed for nausea or vomiting.   pantoprazole (PROTONIX) 40 MG tablet TAKE 1 TABLET BY MOUTH TWICE DAILY BEFORE A MEAL   polyethylene glycol (MIRALAX / GLYCOLAX) 17 g packet Take 17 g by mouth daily as needed for mild constipation.   rosuvastatin (CRESTOR) 20 MG tablet Take 1 tablet by mouth once daily   valACYclovir (VALTREX) 1000 MG tablet Take 1 tablet (1,000 mg total) by mouth 3 (three) times daily. With food x 7-10 days   Facility-Administered Encounter Medications as of 09/08/2020  Medication   denosumab (XGEVA) injection 120 mg    Allergies (verified) Bee venom   History: Past Medical History:  Diagnosis Date   Arthritis    Cirrhosis ([place])    Depression    GERD (gastroesophageal reflux disease)    Hyperlipidemia    Hypertension    Lung cancer ([place])    Metastatic bone cancer ([place])    Past Surgical History:  Procedure Laterality Date   APPENDECTOMY  1971   CHOLECYSTECTOMY  1971   COLONOSCOPY WITH PROPOFOL N/A 11/15/2016   Procedure: COLONOSCOPY WITH PROPOFOL;  Surgeon: Jonathon Bellows, MD;  Location: [facility] ENDOSCOPY;  Service:  Gastroenterology;  Laterality: N/A;   ENDOBRONCHIAL ULTRASOUND Right 07/30/2018   Procedure: ENDOBRONCHIAL ULTRASOUND;  Surgeon: Laverle Hobby, MD;  Location: [facility] ORS;  Service: Pulmonary;  Laterality: Right;   ESOPHAGOGASTRODUODENOSCOPY (EGD) WITH PROPOFOL N/A 01/07/2017   Procedure: ESOPHAGOGASTRODUODENOSCOPY (EGD) WITH PROPOFOL;  Surgeon: Jonathon Bellows, MD;  Location: [facility] ENDOSCOPY;  Service: Gastroenterology;  Laterality: N/A;   LAPAROSCOPY N/A 03/01/2017   Procedure: LAPAROSCOPY DIAGNOSTIC;  Surgeon: Robert Bellow, MD;  Location: [facility] ORS;  Service: General;   Laterality: N/A;   PORTA CATH INSERTION N/A 08/14/2018   Procedure: PORTA CATH INSERTION;  Surgeon: Algernon Huxley, MD;  Location: [facility];  Service: Cardiovascular;  Laterality: N/A;   TONSILECTOMY, ADENOIDECTOMY, BILATERAL MYRINGOTOMY AND TUBES  1955   TONSILLECTOMY     VENTRAL HERNIA REPAIR N/A 03/01/2017   10 x 14 CM Ventralight ST mesh, intraperitoneal location.    VENTRAL HERNIA REPAIR N/A 03/01/2017   Procedure: HERNIA REPAIR VENTRAL ADULT;  Surgeon: Robert Bellow, MD;  Location: [facility] ORS;  Service: General;  Laterality: N/A;   Family History  Problem Relation Age of Onset   Hypertension Mother    Ovarian cancer Mother 75   Heart disease Father    Stroke Father    Ovarian cancer Sister        ? dx cancer had hyst.   Breast cancer Neg Hx    Social History   Socioeconomic History   Marital status: Married    Spouse name: Johnny   Number of children: Not on file   Years of education: Not on file   Highest education level: Not on file  Occupational History   Not on file  Tobacco Use   Smoking status: Former    Packs/day: 2.00    Years: 50.00    Pack years: 100.00    Types: Cigarettes, E-cigarettes    Quit date: 02/07/2012    Years since quitting: 8.5   Smokeless tobacco: Never  Vaping Use   Vaping Use: Some days   Start date: 10/06/2013   Devices: uses no liquid  Substance and Sexual Activity   Alcohol use: No    Alcohol/week: 0.0 standard drinks   Drug use: No   Sexual activity: Yes  Other Topics Concern   Not on file  Social History Narrative   Married   Retired   Clinical cytogeneticist level of education    No children    1 cup of coffee   Social Determinants of Radio broadcast assistant Strain: Low Risk    Difficulty of Paying Living Expenses: Not hard at all  Food Insecurity: No Food Insecurity   Worried About Charity fundraiser in the Last Year: Never true   Arboriculturist in the Last Year: Never true  Transportation Needs: No  Transportation Needs   Lack of Transportation (Medical): No   Lack of Transportation (Non-Medical): No  Physical Activity: Not on file  Stress: No Stress Concern Present   Feeling of Stress : Not at all  Social Connections: Unknown   Frequency of Communication with Friends and Family: More than three times a week   Frequency of Social Gatherings with Friends and Family: Not on file   Attends Religious Services: Not on file   Active Member of Clubs or Organizations: Not on file   Attends Archivist Meetings: Not on file   Marital Status: Married    Tobacco Counseling Counseling given: Not Answered   Clinical Intake:  Pre-visit preparation completed: Yes        Diabetes: No  How often do you need to have someone help you when you read instructions, pamphlets, or other written materials from your doctor or pharmacy?: 1 - Never    Interpreter Needed?: No      Activities of Daily Living In your present state of health, do you have any difficulty performing the following activities: 09/08/2020  Hearing? Y  Vision? N  Difficulty concentrating or making decisions? (No Data)  Comment Hx Memory difficulty  Walking or climbing stairs? N  Dressing or bathing? N  Doing errands, shopping? N  Preparing Food and eating ? N  Using the Toilet? N  In the past six months, have you accidently leaked urine? N  Do you have problems with loss of bowel control? N  Managing your Medications? N  Managing your Finances? N  Housekeeping or managing your Housekeeping? N  Some recent data might be hidden    Patient Care Team: Leone Haven, MD as PCP - General (Family Medicine) Leone Haven, MD as Consulting Physician (Family Medicine) Bary Castilla, Forest Gleason, MD (General Surgery) Telford Nab, RN as Registered Nurse  Indicate any recent [place] you may have received from other than [facility] providers in the past year (date may be approximate).     Assessment:    This is a routine wellness examination for [place].  I connected with Ciera today by telephone and verified that I am speaking with the correct person using two identifiers. Location patient: home Location provider: work Persons participating in the virtual visit: patient, Marine scientist.    I discussed the limitations, risks, security and privacy concerns of performing an evaluation and management service by telephone and the availability of in person appointments. The patient expressed understanding and verbally consented to this telephonic visit.    Interactive audio and video telecommunications were attempted between this provider and patient, however failed, due to patient having technical difficulties OR patient did not have access to video capability.  We continued and completed visit with audio only.  Some vital signs may be absent or patient reported.   Hearing/Vision screen Hearing Screening - Comments:: Difficulty hearing some conversational tones. Hearing aids not worn all the time. Vision Screening - Comments:: Wears corrective lenses  They have seen their ophthalmologist in the last 12 months.   Dietary issues and exercise activities discussed: Current Exercise Habits: Home exercise routine, Intensity: Mild Regular diet Good fluid intake   Goals Addressed               This Visit's Progress     Patient Stated     COMPLETED: DIET - Healthy diet (pt-stated)        Maintain Healthy Lifestyle (pt-stated)        Stay active Healthy diet       Depression Screen PHQ 2/9 Scores 09/08/2020 08/24/2020 01/26/2020 09/08/2019 04/20/2019 02/16/2019 09/05/2018  PHQ - 2 Score 0 0 0 0 0 0 0  PHQ- 9 Score - - - - - - -    Fall Risk Fall Risk  09/08/2020 08/24/2020 09/08/2019 02/12/2019 10/10/2018  Falls in the past year? 0 0 0 1 1  Number falls in past yr: - 0 0 0 0  Injury with Fall? - - - 0 0  Follow up Falls evaluation completed Falls evaluation completed Falls evaluation completed - -     FALL RISK PREVENTION PERTAINING TO THE HOME: Adequate lighting in your home  to reduce risk of falls? Yes   ASSISTIVE DEVICES UTILIZED TO PREVENT FALLS: Life alert? No  Use of a cane, walker or w/c? No   TIMED UP AND GO: Was the test performed? No .   Cognitive Function: MMSE - Mini Mental State Exam 03/14/2020 06/04/2017  Orientation to time 4 5  Orientation to Place 5 5  Registration 3 3  Attention/ Calculation 5 5  Recall 0 0  Language- name 2 objects 2 2  Language- repeat 1 1  Language- follow 3 step command 2 3  Language- read & follow direction 1 1  Write a sentence 1 1  Copy design 1 1  Total score 25 27     6CIT Screen 09/08/2020 09/08/2019 09/05/2018 06/01/2016  What Year? 0 points 0 points 0 points 0 points  What month? 0 points 0 points 0 points 0 points  What time? 0 points - 0 points 0 points  Count back from 20 0 points 0 points 0 points 0 points  Months in reverse 0 points - 0 points 0 points  Repeat phrase 4 points - - 0 points  Total Score 4 - - 0    Immunizations Immunization History  Administered Date(s) Administered   Influenza, High Dose Seasonal PF 11/02/2016, 09/22/2017, 10/26/2017, 10/16/2018, 11/12/2019   Influenza-Unspecified 10/15/2014, 10/29/2015, 11/01/2016   PFIZER(Purple Top)SARS-COV-2 Vaccination 03/27/2019, 04/17/2019, 10/05/2019   Pneumococcal Conjugate-13 11/13/2013   Pneumococcal Polysaccharide-23 11/30/2014   Td 12/06/2017   Zoster, Live 11/15/2014    Health Maintenance Health Maintenance  Topic Date Due   MAMMOGRAM  03/08/2017   INFLUENZA VACCINE  09/05/2020   COVID-19 Vaccine (4 - Booster for [place] series) 09/24/2020 (Originally 01/05/2020)   COLONOSCOPY (Pts 45-83yrs Insurance coverage will need to be confirmed)  11/15/2021   TETANUS/TDAP  12/07/2027   DEXA SCAN  Completed   Hepatitis C Screening  Completed   PNA vac Low Risk Adult  Completed   HPV VACCINES  Aged Out   Zoster Vaccines- Shingrix  Discontinued    Colonoscopy-declined per patient.  Mammogram- last 2019. Ordered per consent.   Vision Screening: Recommended annual ophthalmology exams for early detection of glaucoma and other disorders of the eye.  Dental Screening: Recommended annual dental exams for proper oral hygiene  Community Resource Referral / Chronic Care Management: CRR required this visit?  No   CCM required this visit?  No      Plan:   Keep all routine maintenance appointments.   I have personally reviewed and noted the following in the patient's chart:   Medical and social history Use of alcohol, tobacco or illicit drugs  Current medications and supplements including opioid prescriptions. Patient is not currently taking opioid.  Functional ability and status Nutritional status Physical activity Advanced directives List of other physicians Hospitalizations, surgeries, and ER visits in previous 12 months Vitals Screenings to include cognitive, depression, and falls Referrals and appointments  In addition, I have reviewed and discussed with patient certain preventive protocols, quality metrics, and best practice recommendations. A written personalized care plan for preventive services as well as general preventive health recommendations were provided to patient.     Varney Biles, LPN   03/11/4008

## 2020-09-26 ENCOUNTER — Inpatient Hospital Stay: Payer: PPO

## 2020-09-26 ENCOUNTER — Other Ambulatory Visit: Payer: Self-pay | Admitting: Pharmacist

## 2020-09-26 ENCOUNTER — Inpatient Hospital Stay (HOSPITAL_BASED_OUTPATIENT_CLINIC_OR_DEPARTMENT_OTHER): Payer: PPO | Admitting: Oncology

## 2020-09-26 ENCOUNTER — Encounter: Payer: Self-pay | Admitting: Oncology

## 2020-09-26 VITALS — BP 124/77 | HR 70 | Temp 97.0°F | Resp 18 | Wt 174.0 lb

## 2020-09-26 DIAGNOSIS — C3411 Malignant neoplasm of upper lobe, right bronchus or lung: Secondary | ICD-10-CM

## 2020-09-26 DIAGNOSIS — Z5112 Encounter for antineoplastic immunotherapy: Secondary | ICD-10-CM

## 2020-09-26 DIAGNOSIS — E063 Autoimmune thyroiditis: Secondary | ICD-10-CM | POA: Diagnosis not present

## 2020-09-26 DIAGNOSIS — Z87891 Personal history of nicotine dependence: Secondary | ICD-10-CM

## 2020-09-26 DIAGNOSIS — C7951 Secondary malignant neoplasm of bone: Secondary | ICD-10-CM | POA: Diagnosis not present

## 2020-09-26 LAB — CBC WITH DIFFERENTIAL/PLATELET
Abs Immature Granulocytes: 0.01 10*3/uL (ref 0.00–0.07)
Basophils Absolute: 0 10*3/uL (ref 0.0–0.1)
Basophils Relative: 1 %
Eosinophils Absolute: 0 10*3/uL (ref 0.0–0.5)
Eosinophils Relative: 0 %
HCT: 37.8 % (ref 36.0–46.0)
Hemoglobin: 11.8 g/dL — ABNORMAL LOW (ref 12.0–15.0)
Immature Granulocytes: 0 %
Lymphocytes Relative: 19 %
Lymphs Abs: 0.8 10*3/uL (ref 0.7–4.0)
MCH: 27.4 pg (ref 26.0–34.0)
MCHC: 31.2 g/dL (ref 30.0–36.0)
MCV: 87.7 fL (ref 80.0–100.0)
Monocytes Absolute: 0.4 10*3/uL (ref 0.1–1.0)
Monocytes Relative: 8 %
Neutro Abs: 3.1 10*3/uL (ref 1.7–7.7)
Neutrophils Relative %: 72 %
Platelets: 162 10*3/uL (ref 150–400)
RBC: 4.31 MIL/uL (ref 3.87–5.11)
RDW: 15.1 % (ref 11.5–15.5)
WBC: 4.4 10*3/uL (ref 4.0–10.5)
nRBC: 0 % (ref 0.0–0.2)

## 2020-09-26 LAB — COMPREHENSIVE METABOLIC PANEL
ALT: 18 U/L (ref 0–44)
AST: 26 U/L (ref 15–41)
Albumin: 3.7 g/dL (ref 3.5–5.0)
Alkaline Phosphatase: 94 U/L (ref 38–126)
Anion gap: 6 (ref 5–15)
BUN: 17 mg/dL (ref 8–23)
CO2: 32 mmol/L (ref 22–32)
Calcium: 9.2 mg/dL (ref 8.9–10.3)
Chloride: 101 mmol/L (ref 98–111)
Creatinine, Ser: 0.75 mg/dL (ref 0.44–1.00)
GFR, Estimated: >60 mL/min (ref >60)
Glucose, Bld: 103 mg/dL — ABNORMAL HIGH (ref 70–99)
Potassium: 3.9 mmol/L (ref 3.5–5.1)
Sodium: 139 mmol/L (ref 135–145)
Total Bilirubin: 0.8 mg/dL (ref 0.3–1.2)
Total Protein: 7.2 g/dL (ref 6.5–8.1)

## 2020-09-26 MED ORDER — SODIUM CHLORIDE 0.9% FLUSH
10.0000 mL | INTRAVENOUS | Status: DC | PRN
  Administered 2020-09-26: 10 mL via INTRAVENOUS
  Filled 2020-09-26: qty 10

## 2020-09-26 MED ORDER — SODIUM CHLORIDE 0.9 % IV SOLN
Freq: Once | INTRAVENOUS | Status: AC
  Filled 2020-09-26: qty 250

## 2020-09-26 MED ORDER — HEPARIN SOD (PORK) LOCK FLUSH 100 UNIT/ML IV SOLN
INTRAVENOUS | Status: AC
  Filled 2020-09-26: qty 5

## 2020-09-26 MED ORDER — HEPARIN SOD (PORK) LOCK FLUSH 100 UNIT/ML IV SOLN
500.0000 [IU] | Freq: Once | INTRAVENOUS | Status: AC
  Administered 2020-09-26: 500 [IU] via INTRAVENOUS
  Filled 2020-09-26: qty 5

## 2020-09-26 MED ORDER — HEPARIN SOD (PORK) LOCK FLUSH 100 UNIT/ML IV SOLN
500.0000 [IU] | Freq: Once | INTRAVENOUS | Status: DC | PRN
  Filled 2020-09-26: qty 5

## 2020-09-26 MED ORDER — SODIUM CHLORIDE 0.9 % IV SOLN
1200.0000 mg | Freq: Once | INTRAVENOUS | Status: AC
  Administered 2020-09-26: 1200 mg via INTRAVENOUS
  Filled 2020-09-26: qty 20

## 2020-09-26 NOTE — Progress Notes (Signed)
I connected with Anne [name] on 09/26/20 at 10:00 AM EDT by video enabled telemedicine visit and verified that I am speaking with the correct person using two identifiers.   I discussed the limitations, risks, security and privacy concerns of performing an evaluation and management service by telemedicine and the availability of in-person appointments. I also discussed with the patient that there may be a patient responsible charge related to this service. The patient expressed understanding and agreed to proceed.  Other persons participating in the visit and their role in the encounter:  none  Patient's location:  cancer center Provider's location:  home  Chief Complaint: On treatment assessment prior to cycle 34 of maintenance Tecentriq  History of present illness: patient is a 71 year old female with a past medical history significant for hypertension hyperlipidemia obesity and cirrhosis of the liver among other medical problems.  She has been referred to [place] for findings of bone metastases and her recent MRI.  She has a prior history of 3 packs/day day smoking for over 45 years and quit smoking 5 years ago.She had a CT chest abdomen pelvis in 2018 which showed a 5 mm lung nodule in the left lower lobe.  Recently over the last 2 months patient has been having worsening back pain and was referred to orthopedics.  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  Pathologic fracture of S1.   PET scan showed 2 RUL lung nodules, hilar and mediastinal adenopathy and widespread bone mets. MRI brain negative.   Patient completed palliative RT to her spine. Bronchoscopy showed small cell lung cancer.  Palliative carboplatin, etoposide and Tecentriq started on 08/18/2018. Scans after 4 cycles showed stable disease. She is on maintenance tecentriq  Interval history she is doing  well overall. She has gained some weight back. Denies any specific complaints at this time   Review of Systems  Constitutional:  Negative for chills, fever, malaise/fatigue and weight loss.  HENT:  Negative for congestion, ear discharge and nosebleeds.   Eyes:  Negative for blurred vision.  Respiratory:  Negative for cough, hemoptysis, sputum production, shortness of breath and wheezing.   Cardiovascular:  Negative for chest pain, palpitations, orthopnea and claudication.  Gastrointestinal:  Negative for abdominal pain, blood in stool, constipation, diarrhea, heartburn, melena, nausea and vomiting.  Genitourinary:  Negative for dysuria, flank pain, frequency, hematuria and urgency.  Musculoskeletal:  Negative for back pain, joint pain and myalgias.  Skin:  Negative for rash.  Neurological:  Negative for dizziness, tingling, focal weakness, seizures, weakness and headaches.  Endo/Heme/Allergies:  Does not bruise/bleed easily.  Psychiatric/Behavioral:  Negative for depression and suicidal ideas. The patient does not have insomnia.    Allergies  Allergen Reactions   Bee Venom Swelling    Past Medical History:  Diagnosis Date   Arthritis    Cirrhosis ([place])    Depression    GERD (gastroesophageal reflux disease)    Hyperlipidemia    Hypertension    Lung cancer ([place])    Metastatic bone cancer ([place])     Past Surgical History:  Procedure Laterality Date   APPENDECTOMY  1971   CHOLECYSTECTOMY  1971   COLONOSCOPY WITH PROPOFOL N/A 11/15/2016   Procedure: COLONOSCOPY WITH PROPOFOL;  Surgeon: Jonathon Bellows, MD;  Location: [facility] ENDOSCOPY;  Service: Gastroenterology;  Laterality: N/A;   ENDOBRONCHIAL ULTRASOUND Right 07/30/2018   Procedure: ENDOBRONCHIAL ULTRASOUND;  Surgeon: Laverle Hobby, MD;  Location: [facility] ORS;  Service: Pulmonary;  Laterality: Right;   ESOPHAGOGASTRODUODENOSCOPY (EGD) WITH PROPOFOL N/A 01/07/2017   Procedure: ESOPHAGOGASTRODUODENOSCOPY (EGD) WITH PROPOFOL;   Surgeon: Jonathon Bellows, MD;  Location: [facility] ENDOSCOPY;  Service: Gastroenterology;  Laterality: N/A;   LAPAROSCOPY N/A 03/01/2017   Procedure: LAPAROSCOPY DIAGNOSTIC;  Surgeon: Robert Bellow, MD;  Location: [facility] ORS;  Service: General;  Laterality: N/A;   PORTA CATH INSERTION N/A 08/14/2018   Procedure: PORTA CATH INSERTION;  Surgeon: Algernon Huxley, MD;  Location: [place] [facility] LAB;  Service: Cardiovascular;  Laterality: N/A;   TONSILECTOMY, ADENOIDECTOMY, BILATERAL MYRINGOTOMY AND TUBES  1955   TONSILLECTOMY     VENTRAL HERNIA REPAIR N/A 03/01/2017   10 x 14 CM Ventralight ST mesh, intraperitoneal location.    VENTRAL HERNIA REPAIR N/A 03/01/2017   Procedure: HERNIA REPAIR VENTRAL ADULT;  Surgeon: Robert Bellow, MD;  Location: [facility] ORS;  Service: General;  Laterality: N/A;    Social History   Socioeconomic History   Marital status: Married    Spouse name: Johnny   Number of children: Not on file   Years of education: Not on file   Highest education level: Not on file  Occupational History   Not on file  Tobacco Use   Smoking status: Former    Packs/day: 2.00    Years: 50.00    Pack years: 100.00    Types: Cigarettes, E-cigarettes    Quit date: 02/07/2012    Years since quitting: 8.6   Smokeless tobacco: Never  Vaping Use   Vaping Use: Some days   Start date: 10/06/2013   Devices: uses no liquid  Substance and Sexual Activity   Alcohol use: No    Alcohol/week: 0.0 standard drinks   Drug use: No   Sexual activity: Yes  Other Topics Concern   Not on file  Social History Narrative   Married   Retired   Clinical cytogeneticist level of education    No children    1 cup of coffee   Social Determinants of Radio broadcast assistant Strain: Low Risk    Difficulty of Paying Living Expenses: Not hard at all  Food Insecurity: No Food Insecurity   Worried About Charity fundraiser in the Last Year: Never true   Arboriculturist in the Last Year: Never true  Transportation  Needs: No Transportation Needs   Lack of Transportation (Medical): No   Lack of Transportation (Non-Medical): No  Physical Activity: Not on file  Stress: No Stress Concern Present   Feeling of Stress : Not at all  Social Connections: Unknown   Frequency of Communication with Friends and Family: More than three times a week   Frequency of Social Gatherings with Friends and Family: Not on file   Attends Religious Services: Not on Electrical engineer or Organizations: Not on file   Attends Archivist Meetings: Not on file   Marital Status: Married  Human resources officer Violence: Not At Risk   Fear of Current or Ex-Partner: No   Emotionally Abused: No   Physically Abused: No   Sexually Abused: No    Family History  Problem Relation Age of Onset   Hypertension Mother    Ovarian cancer Mother 44   Heart disease Father    Stroke Father    Ovarian cancer Sister        ? dx cancer had hyst.   Breast cancer  Neg Hx      Current Outpatient Medications:    amLODipine (NORVASC) 5 MG tablet, Take 1 tablet (5 mg total) by mouth daily., Disp: 90 tablet, Rfl: 3   buPROPion (WELLBUTRIN XL) 300 MG 24 hr tablet, Take 1 tablet by mouth once daily, Disp: 90 tablet, Rfl: 0   clobetasol cream (TEMOVATE) 4.49 %, Apply 1 application topically 2 (two) times daily. Lower back, Disp: 60 g, Rfl: 1   erythromycin ophthalmic ointment, Place 1 application into the right eye daily as needed., Disp: , Rfl:    escitalopram (LEXAPRO) 20 MG tablet, Take 1 tablet (20 mg total) by mouth daily., Disp: 90 tablet, Rfl: 1   fluticasone (FLONASE) 50 MCG/ACT nasal spray, Place 2 sprays into both nostrils daily., Disp: 16 g, Rfl: 6   folic acid (FOLVITE) 1 MG tablet, Take 1 tablet by mouth once daily, Disp: 90 tablet, Rfl: 0   ibuprofen (ADVIL,MOTRIN) 200 MG tablet, Take 400 mg by mouth every 8 (eight) hours as needed for headache or moderate pain. , Disp: , Rfl:    levothyroxine (SYNTHROID) 150 MCG tablet,  TAKE 1 TABLET BY MOUTH ONCE DAILY BEFORE BREAKFAST, Disp: 90 tablet, Rfl: 0   lidocaine-prilocaine (EMLA) cream, Apply to affected area once, Disp: 30 g, Rfl: 3   loperamide (IMODIUM) 2 MG capsule, Take 1 capsule (2 mg total) by mouth every 2 (two) hours as needed for diarrhea or loose stools. Do not exceed 36m daily., Disp: 30 capsule, Rfl: 1   Multiple Vitamins-Minerals (PRESERVISION AREDS 2) CAPS, Take 1 capsule by mouth 2 (two) times a day. , Disp: , Rfl:    ondansetron (ZOFRAN ODT) 4 MG disintegrating tablet, Take 1 tablet (4 mg total) by mouth every 8 (eight) hours as needed for nausea or vomiting., Disp: 20 tablet, Rfl: 0   pantoprazole (PROTONIX) 40 MG tablet, TAKE 1 TABLET BY MOUTH TWICE DAILY BEFORE A MEAL, Disp: 180 tablet, Rfl: 0   polyethylene glycol (MIRALAX / GLYCOLAX) 17 g packet, Take 17 g by mouth daily as needed for mild constipation., Disp: 14 each, Rfl: 0   rosuvastatin (CRESTOR) 20 MG tablet, Take 1 tablet by mouth once daily, Disp: 90 tablet, Rfl: 0   valACYclovir (VALTREX) 1000 MG tablet, Take 1 tablet (1,000 mg total) by mouth 3 (three) times daily. With food x 7-10 days, Disp: 30 tablet, Rfl: 0 No current facility-administered medications for this visit.  Facility-Administered Medications Ordered in Other Visits:    denosumab (XGEVA) injection 120 mg, 120 mg, Subcutaneous, Q30 days, RSindy Guadeloupe MD, 120 mg at 03/05/19 1525  No results found.  No images are attached to the encounter.   CMP Latest Ref Rng & Units 09/26/2020  Glucose 70 - 99 mg/dL 103(H)  BUN 8 - 23 mg/dL 17  Creatinine 0.44 - 1.00 mg/dL 0.75  Sodium 135 - 145 mmol/L 139  Potassium 3.5 - 5.1 mmol/L 3.9  Chloride 98 - 111 mmol/L 101  CO2 22 - 32 mmol/L 32  Calcium 8.9 - 10.3 mg/dL 9.2  Total Protein 6.5 - 8.1 g/dL 7.2  Total Bilirubin 0.3 - 1.2 mg/dL 0.8  Alkaline Phos 38 - 126 U/L 94  AST 15 - 41 U/L 26  ALT 0 - 44 U/L 18   CBC Latest Ref Rng & Units 09/26/2020  WBC 4.0 - 10.5 K/uL 4.4   Hemoglobin 12.0 - 15.0 g/dL 11.8(L)  Hematocrit 36.0 - 46.0 % 37.8  Platelets 150 - 400 K/uL 162  Observation/objective:appears in no acute distress over video visit today. Breathing is non labored.   Assessment and plan: Patient is a 70 year old female with history of extensive stage small cell lung cancer and bone metastases here for on treatment assessment prior to cycle 35 of maintenance Tecentriq  Counts okay to proceed with cycle 35 of maintenance Tecentriq today and I will see her back in 3 weeks for cycle 36.  Plan is to continue this until progression or toxicity.  She had recentCT chest abdomen pelvis without contrast and a bone scan in June 2022 which overall showed evidence of stable disease.  MRI brain with and without contrast showed no evidence of distant metastatic disease.  Plan is to repeat scans in September 2022.  Autoimmune hypothyroidism- reviewed recs from Dr. Gabriel Carina. Emphasized the importance of taking levothyroxine correctly in the morning on empty stomach without mixing with other meds. Nuala Alpha met with her today from pharmacy as well and pt will bring her pill box again 3 weeks  Follow-up instructions:as above  I discussed the assessment and treatment plan with the patient. The patient was provided an opportunity to ask questions and all were answered. The patient agreed with the plan and demonstrated an understanding of the instructions.   The patient was advised to call back or seek an in-person evaluation if the symptoms worsen or if the condition fails to improve as anticipated.    Visit Diagnosis: 1. Encounter for antineoplastic immunotherapy   2. Autoimmune hypothyroidism   3. Small cell lung cancer, right upper lobe ([place])     Dr. Randa Evens, MD, MPH [facility] Tel- [phone] 09/26/2020 6:01 PM

## 2020-09-26 NOTE — Patient Instructions (Signed)
[facility] ONCOLOGY  Discharge Instructions: Thank you for choosing [facility] to provide your oncology and hematology care.  If you have a lab appointment with the [facility], please go directly to the [facility] and check in at the registration area.  Wear comfortable clothing and clothing appropriate for easy access to any Portacath or PICC line.   We strive to give you quality time with your provider. You may need to reschedule your appointment if you arrive late (15 or more minutes).  Arriving late affects you and other patients whose appointments are after yours.  Also, if you miss three or more appointments without notifying the office, you may be dismissed from the clinic at the provider's discretion.      For prescription refill requests, have your pharmacy contact our office and allow 72 hours for refills to be completed.    Today you received the following chemotherapy and/or immunotherapy agents Tecentriq    To help prevent nausea and vomiting after your treatment, we encourage you to take your nausea medication as directed.  BELOW ARE SYMPTOMS THAT SHOULD BE REPORTED IMMEDIATELY: *FEVER GREATER THAN 100.4 F (38 C) OR HIGHER *CHILLS OR SWEATING *NAUSEA AND VOMITING THAT IS NOT CONTROLLED WITH YOUR NAUSEA MEDICATION *UNUSUAL SHORTNESS OF BREATH *UNUSUAL BRUISING OR BLEEDING *URINARY PROBLEMS (pain or burning when urinating, or frequent urination) *BOWEL PROBLEMS (unusual diarrhea, constipation, pain near the anus) TENDERNESS IN MOUTH AND THROAT WITH OR WITHOUT PRESENCE OF ULCERS (sore throat, sores in mouth, or a toothache) UNUSUAL RASH, SWELLING OR PAIN  UNUSUAL VAGINAL DISCHARGE OR ITCHING   Items with * indicate a potential emergency and should be followed up as soon as possible or go to the Emergency Department if any problems should occur.  Please show the CHEMOTHERAPY ALERT CARD or IMMUNOTHERAPY ALERT CARD at check-in to  the Emergency Department and triage nurse.  Should you have questions after your visit or need to cancel or reschedule your appointment, please contact [facility]  [phone] and follow the prompts.  Office hours are 8:00 a.m. to 4:30 p.m. Monday - Friday. Please note that voicemails left after 4:00 p.m. may not be returned until the following business day.  We are closed weekends and major holidays. You have access to a nurse at all times for urgent questions. Please call the main number to the clinic [phone] and follow the prompts.  For any non-urgent questions, you may also contact your provider using MyChart. We now offer e-Visits for anyone 75 and older to request care online for non-urgent symptoms. For details visit [email or web address].   Also download the MyChart app! Go to the app store, search "MyChart", open the app, select [facility], and log in with your MyChart username and password.  Due to Covid, a mask is required upon entering the hospital/clinic. If you do not have a mask, one will be given to you upon arrival. For doctor visits, patients may have 1 support person aged 2 or older with them. For treatment visits, patients cannot have anyone with them due to current Covid guidelines and our immunocompromised population.

## 2020-10-17 ENCOUNTER — Inpatient Hospital Stay: Payer: PPO | Admitting: Pharmacist

## 2020-10-17 ENCOUNTER — Inpatient Hospital Stay: Payer: PPO | Attending: Oncology

## 2020-10-17 ENCOUNTER — Inpatient Hospital Stay: Payer: PPO

## 2020-10-17 ENCOUNTER — Inpatient Hospital Stay: Payer: PPO | Admitting: Oncology

## 2020-10-28 ENCOUNTER — Other Ambulatory Visit: Payer: Self-pay | Admitting: Family Medicine

## 2020-10-28 DIAGNOSIS — F331 Major depressive disorder, recurrent, moderate: Secondary | ICD-10-CM

## 2020-10-31 ENCOUNTER — Other Ambulatory Visit: Payer: Self-pay

## 2020-10-31 ENCOUNTER — Ambulatory Visit (INDEPENDENT_AMBULATORY_CARE_PROVIDER_SITE_OTHER): Payer: PPO | Admitting: Family Medicine

## 2020-10-31 ENCOUNTER — Telehealth: Payer: Self-pay | Admitting: Family Medicine

## 2020-10-31 DIAGNOSIS — F331 Major depressive disorder, recurrent, moderate: Secondary | ICD-10-CM

## 2020-10-31 DIAGNOSIS — R413 Other amnesia: Secondary | ICD-10-CM

## 2020-10-31 MED ORDER — BUPROPION HCL ER (XL) 150 MG PO TB24: 150.0000 mg | ORAL_TABLET | Freq: Every day | ORAL | 1 refills | Status: DC

## 2020-10-31 NOTE — Patient Instructions (Signed)
Nice to see you. We are going to add Wellbutrin to your medication regimen.  You will continue your Lexapro 20 mg once daily. I have referred you to neurology for evaluation of your memory difficulty.  They should contact you to get this appointment scheduled. Please set an alarm on your phone to remind you when to take your medication each day.

## 2020-10-31 NOTE — Assessment & Plan Note (Signed)
This continues to be an issue.  I discussed that this could be contributing to her memory difficulty.  She will continue Lexapro 20 mg once daily.  We will start on Wellbutrin XL 150 mg daily.  She will follow-up in 2 months.

## 2020-10-31 NOTE — Progress Notes (Signed)
Tommi Rumps, MD Phone: [phone]  Anne [name] is a 70 y.o. female who presents today for f/u.  Depression: Patient continues to have issues with depression.  She does have some thoughts that she would be better off dead though no thoughts of harming herself.  No active suicidal thoughts.  No plan or intent to harm her self.  She has been on Lexapro 20 mg once daily.  No history of seizures.  Memory difficulty: The patient and her husband both note her short-term memory is gone.  She is going to start on Prevagen.  She oftentimes forgets to take her medications.  She does report a family history of dementia.  She got a 0 out of 5 on mini cog testing.  Social History   Tobacco Use  Smoking Status Former   Packs/day: 2.00   Years: 50.00   Pack years: 100.00   Types: Cigarettes, E-cigarettes   Quit date: 02/07/2012   Years since quitting: 8.7  Smokeless Tobacco Never    Current Outpatient Medications on File Prior to Visit  Medication Sig Dispense Refill   amLODipine (NORVASC) 5 MG tablet Take 1 tablet (5 mg total) by mouth daily. 90 tablet 3   clobetasol cream (TEMOVATE) 4.66 % Apply 1 application topically 2 (two) times daily. Lower back 60 g 1   erythromycin ophthalmic ointment Place 1 application into the right eye daily as needed.     escitalopram (LEXAPRO) 20 MG tablet Take 1 tablet (20 mg total) by mouth daily. 90 tablet 1   fluticasone (FLONASE) 50 MCG/ACT nasal spray Place 2 sprays into both nostrils daily. 16 g 6   folic acid (FOLVITE) 1 MG tablet Take 1 tablet by mouth once daily 90 tablet 0   ibuprofen (ADVIL,MOTRIN) 200 MG tablet Take 400 mg by mouth every 8 (eight) hours as needed for headache or moderate pain.      levothyroxine (SYNTHROID) 125 MCG tablet Take 125 mcg by mouth daily before breakfast.     lidocaine-prilocaine (EMLA) cream Apply to affected area once 30 g 3   loperamide (IMODIUM) 2 MG capsule Take 1 capsule (2 mg total) by mouth every 2 (two) hours  as needed for diarrhea or loose stools. Do not exceed 16mg  daily. 30 capsule 1   Multiple Vitamins-Minerals (PRESERVISION AREDS 2) CAPS Take 1 capsule by mouth 2 (two) times a day.      ondansetron (ZOFRAN ODT) 4 MG disintegrating tablet Take 1 tablet (4 mg total) by mouth every 8 (eight) hours as needed for nausea or vomiting. 20 tablet 0   pantoprazole (PROTONIX) 40 MG tablet TAKE 1 TABLET BY MOUTH TWICE DAILY BEFORE A MEAL 180 tablet 0   polyethylene glycol (MIRALAX / GLYCOLAX) 17 g packet Take 17 g by mouth daily as needed for mild constipation. 14 each 0   rosuvastatin (CRESTOR) 20 MG tablet Take 1 tablet by mouth once daily 90 tablet 0   valACYclovir (VALTREX) 1000 MG tablet Take 1 tablet (1,000 mg total) by mouth 3 (three) times daily. With food x 7-10 days 30 tablet 0   Current Facility-Administered Medications on File Prior to Visit  Medication Dose Route Frequency Provider Last Rate Last Admin   denosumab (XGEVA) injection 120 mg  120 mg Subcutaneous Q30 days Sindy Guadeloupe, MD   120 mg at 03/05/19 1525     ROS see history of present illness  Objective  Physical Exam Vitals:   10/31/20 1438  BP: 122/78  Pulse: 93  Temp: 99.3 F (37.4 C)  SpO2: 93%    BP Readings from Last 3 Encounters:  10/31/20 122/78  09/26/20 124/77  09/05/20 134/78   Wt Readings from Last 3 Encounters:  10/31/20 176 lb (79.8 kg)  09/26/20 174 lb (78.9 kg)  09/08/20 173 lb (78.5 kg)    Physical Exam Constitutional:      General: She is not in acute distress.    Appearance: She is not diaphoretic.  Cardiovascular:     Rate and Rhythm: Normal rate and regular rhythm.     Heart sounds: Normal heart sounds.  Pulmonary:     Effort: Pulmonary effort is normal.     Breath sounds: Normal breath sounds.  Skin:    General: Skin is warm and dry.  Neurological:     Mental Status: She is alert.     Assessment/Plan: Please see individual problem list.  Problem List Items Addressed This Visit      Depression    This continues to be an issue.  I discussed that this could be contributing to her memory difficulty.  She will continue Lexapro 20 mg once daily.  We will start on Wellbutrin XL 150 mg daily.  She will follow-up in 2 months.      Relevant Medications   buPROPion (WELLBUTRIN XL) 150 MG 24 hr tablet   Memory difficulty    This continues to be an issue.  She failed her mini cog today.  Depression certainly could be playing a role though there may be some underlying dementia.  We will refer her to neurology for evaluation.      Relevant Orders   Ambulatory referral to Neurology    Return in about 8 weeks (around 12/26/2020).  This visit occurred during the SARS-CoV-2 public health emergency.  Safety protocols were in place, including screening questions prior to the visit, additional usage of staff PPE, and extensive cleaning of exam room while observing appropriate contact time as indicated for disinfecting solutions.    Tommi Rumps, MD [facility]

## 2020-10-31 NOTE — Telephone Encounter (Signed)
Patient calling back in and states that the Wellbutrin that was sent to [address] can not be filled.   He states the pharmacy states they can not fill this but would not tell him why.   Please advise

## 2020-10-31 NOTE — Assessment & Plan Note (Signed)
This continues to be an issue.  She failed her mini cog today.  Depression certainly could be playing a role though there may be some underlying dementia.  We will refer her to neurology for evaluation.

## 2020-11-01 ENCOUNTER — Inpatient Hospital Stay: Payer: PPO

## 2020-11-01 ENCOUNTER — Encounter: Payer: Self-pay | Admitting: Nurse Practitioner

## 2020-11-01 ENCOUNTER — Other Ambulatory Visit: Payer: Self-pay | Admitting: Oncology

## 2020-11-01 ENCOUNTER — Encounter: Payer: Self-pay | Admitting: Oncology

## 2020-11-01 ENCOUNTER — Inpatient Hospital Stay: Payer: PPO | Attending: Oncology | Admitting: Nurse Practitioner

## 2020-11-01 VITALS — BP 138/82 | HR 87 | Temp 99.0°F | Resp 16 | Wt 174.8 lb

## 2020-11-01 DIAGNOSIS — E063 Autoimmune thyroiditis: Secondary | ICD-10-CM | POA: Diagnosis not present

## 2020-11-01 DIAGNOSIS — C3411 Malignant neoplasm of upper lobe, right bronchus or lung: Secondary | ICD-10-CM | POA: Insufficient documentation

## 2020-11-01 DIAGNOSIS — I10 Essential (primary) hypertension: Secondary | ICD-10-CM | POA: Insufficient documentation

## 2020-11-01 DIAGNOSIS — Z79890 Hormone replacement therapy (postmenopausal): Secondary | ICD-10-CM | POA: Insufficient documentation

## 2020-11-01 DIAGNOSIS — C7951 Secondary malignant neoplasm of bone: Secondary | ICD-10-CM | POA: Diagnosis not present

## 2020-11-01 DIAGNOSIS — Z87891 Personal history of nicotine dependence: Secondary | ICD-10-CM

## 2020-11-01 DIAGNOSIS — Z95828 Presence of other vascular implants and grafts: Secondary | ICD-10-CM

## 2020-11-01 DIAGNOSIS — Z9049 Acquired absence of other specified parts of digestive tract: Secondary | ICD-10-CM | POA: Insufficient documentation

## 2020-11-01 DIAGNOSIS — Z5112 Encounter for antineoplastic immunotherapy: Secondary | ICD-10-CM

## 2020-11-01 DIAGNOSIS — E785 Hyperlipidemia, unspecified: Secondary | ICD-10-CM | POA: Insufficient documentation

## 2020-11-01 DIAGNOSIS — Z8041 Family history of malignant neoplasm of ovary: Secondary | ICD-10-CM | POA: Diagnosis not present

## 2020-11-01 DIAGNOSIS — E669 Obesity, unspecified: Secondary | ICD-10-CM | POA: Insufficient documentation

## 2020-11-01 DIAGNOSIS — K7460 Unspecified cirrhosis of liver: Secondary | ICD-10-CM | POA: Diagnosis not present

## 2020-11-01 DIAGNOSIS — Z79899 Other long term (current) drug therapy: Secondary | ICD-10-CM

## 2020-11-01 DIAGNOSIS — Z923 Personal history of irradiation: Secondary | ICD-10-CM | POA: Diagnosis not present

## 2020-11-01 LAB — CBC WITH DIFFERENTIAL/PLATELET
Abs Immature Granulocytes: 0.02 10*3/uL (ref 0.00–0.07)
Basophils Absolute: 0 10*3/uL (ref 0.0–0.1)
Basophils Relative: 1 %
Eosinophils Absolute: 0 10*3/uL (ref 0.0–0.5)
Eosinophils Relative: 0 %
HCT: 38.8 % (ref 36.0–46.0)
Hemoglobin: 12.2 g/dL (ref 12.0–15.0)
Immature Granulocytes: 0 %
Lymphocytes Relative: 9 %
Lymphs Abs: 0.6 10*3/uL — ABNORMAL LOW (ref 0.7–4.0)
MCH: 26.9 pg (ref 26.0–34.0)
MCHC: 31.4 g/dL (ref 30.0–36.0)
MCV: 85.7 fL (ref 80.0–100.0)
Monocytes Absolute: 0.4 10*3/uL (ref 0.1–1.0)
Monocytes Relative: 6 %
Neutro Abs: 6 10*3/uL (ref 1.7–7.7)
Neutrophils Relative %: 84 %
Platelets: 159 10*3/uL (ref 150–400)
RBC: 4.53 MIL/uL (ref 3.87–5.11)
RDW: 14.4 % (ref 11.5–15.5)
WBC: 7.1 10*3/uL (ref 4.0–10.5)
nRBC: 0 % (ref 0.0–0.2)

## 2020-11-01 LAB — COMPREHENSIVE METABOLIC PANEL
ALT: 13 U/L (ref 0–44)
AST: 22 U/L (ref 15–41)
Albumin: 3.8 g/dL (ref 3.5–5.0)
Alkaline Phosphatase: 109 U/L (ref 38–126)
Anion gap: 11 (ref 5–15)
BUN: 15 mg/dL (ref 8–23)
CO2: 29 mmol/L (ref 22–32)
Calcium: 9.1 mg/dL (ref 8.9–10.3)
Chloride: 98 mmol/L (ref 98–111)
Creatinine, Ser: 0.84 mg/dL (ref 0.44–1.00)
GFR, Estimated: >60 mL/min (ref >60)
Glucose, Bld: 110 mg/dL — ABNORMAL HIGH (ref 70–99)
Potassium: 3.8 mmol/L (ref 3.5–5.1)
Sodium: 138 mmol/L (ref 135–145)
Total Bilirubin: 0.6 mg/dL (ref 0.3–1.2)
Total Protein: 7.4 g/dL (ref 6.5–8.1)

## 2020-11-01 MED ORDER — HEPARIN SOD (PORK) LOCK FLUSH 100 UNIT/ML IV SOLN
INTRAVENOUS | Status: AC
  Administered 2020-11-01: 500 [IU]
  Filled 2020-11-01: qty 5

## 2020-11-01 MED ORDER — SODIUM CHLORIDE 0.9 % IV SOLN
1200.0000 mg | Freq: Once | INTRAVENOUS | Status: AC
  Administered 2020-11-01: 1200 mg via INTRAVENOUS
  Filled 2020-11-01: qty 20

## 2020-11-01 MED ORDER — HEPARIN SOD (PORK) LOCK FLUSH 100 UNIT/ML IV SOLN
500.0000 [IU] | Freq: Once | INTRAVENOUS | Status: AC
  Administered 2020-11-01: 500 [IU] via INTRAVENOUS
  Filled 2020-11-01: qty 5

## 2020-11-01 MED ORDER — SODIUM CHLORIDE 0.9% FLUSH
10.0000 mL | Freq: Once | INTRAVENOUS | Status: AC
  Administered 2020-11-01: 10 mL via INTRAVENOUS
  Filled 2020-11-01: qty 10

## 2020-11-01 MED ORDER — SODIUM CHLORIDE 0.9 % IV SOLN
Freq: Once | INTRAVENOUS | Status: AC
  Filled 2020-11-01: qty 250

## 2020-11-01 NOTE — Patient Instructions (Signed)
[facility]  Discharge Instructions: Thank you for choosing [facility] to provide your oncology and hematology care.  If you have a lab appointment with the [facility], please go directly to the [facility] and check in at the registration area.  Wear comfortable clothing and clothing appropriate for easy access to any Portacath or PICC line.   We strive to give you quality time with your provider. You may need to reschedule your appointment if you arrive late (15 or more minutes).  Arriving late affects you and other patients whose appointments are after yours.  Also, if you miss three or more appointments without notifying the office, you may be dismissed from the clinic at the provider's discretion.      For prescription refill requests, have your pharmacy contact our office and allow 72 hours for refills to be completed.    Today you received the following chemotherapy and/or immunotherapy agents TECENTRIQ      To help prevent nausea and vomiting after your treatment, we encourage you to take your nausea medication as directed.  BELOW ARE SYMPTOMS THAT SHOULD BE REPORTED IMMEDIATELY: *FEVER GREATER THAN 100.4 F (38 C) OR HIGHER *CHILLS OR SWEATING *NAUSEA AND VOMITING THAT IS NOT CONTROLLED WITH YOUR NAUSEA MEDICATION *UNUSUAL SHORTNESS OF BREATH *UNUSUAL BRUISING OR BLEEDING *URINARY PROBLEMS (pain or burning when urinating, or frequent urination) *BOWEL PROBLEMS (unusual diarrhea, constipation, pain near the anus) TENDERNESS IN MOUTH AND THROAT WITH OR WITHOUT PRESENCE OF ULCERS (sore throat, sores in mouth, or a toothache) UNUSUAL RASH, SWELLING OR PAIN  UNUSUAL VAGINAL DISCHARGE OR ITCHING   Items with * indicate a potential emergency and should be followed up as soon as possible or go to the Emergency Department if any problems should occur.  Please show the CHEMOTHERAPY ALERT CARD or IMMUNOTHERAPY ALERT CARD at check-in  to the Emergency Department and triage nurse.  Should you have questions after your visit or need to cancel or reschedule your appointment, please contact [facility]  [phone] and follow the prompts.  Office hours are 8:00 a.m. to 4:30 p.m. Monday - Friday. Please note that voicemails left after 4:00 p.m. may not be returned until the following business day.  We are closed weekends and major holidays. You have access to a nurse at all times for urgent questions. Please call the main number to the clinic [phone] and follow the prompts.  For any non-urgent questions, you may also contact your provider using MyChart. We now offer e-Visits for anyone 72 and older to request care online for non-urgent symptoms. For details visit [email or web address].   Also download the MyChart app! Go to the app store, search "MyChart", open the app, select [facility], and log in with your MyChart username and password.  Due to Covid, a mask is required upon entering the hospital/clinic. If you do not have a mask, one will be given to you upon arrival. For doctor visits, patients may have 1 support person aged 72 or older with them. For treatment visits, patients cannot have anyone with them due to current Covid guidelines and our immunocompromised population.   Atezolizumab injection What is this medication? ATEZOLIZUMAB (a te zoe LIZ ue mab) is a monoclonal antibody. It is used to treat bladder cancer (urothelial cancer), liver cancer, lung cancer, and melanoma. This medicine may be used for other purposes; ask your health care provider or pharmacist if you have questions. COMMON BRAND NAME(S): [employer]  What should I tell my care team before I take this medication? They need to know if you have any of these conditions: autoimmune diseases like Crohn's disease, ulcerative colitis, or lupus have had or planning to have an allogeneic stem cell transplant (uses someone  else's stem cells) history of organ transplant history of radiation to the chest nervous system problems like myasthenia gravis or Guillain-Barre syndrome an unusual or allergic reaction to atezolizumab, other medicines, foods, dyes, or preservatives pregnant or trying to get pregnant breast-feeding How should I use this medication? This medicine is for infusion into a vein. It is given by a health care professional in a hospital or clinic setting. A special MedGuide will be given to you before each treatment. Be sure to read this information carefully each time. Talk to your pediatrician regarding the use of this medicine in children. Special care may be needed. Overdosage: If you think you have taken too much of this medicine contact a poison control center or emergency room at once. NOTE: This medicine is only for you. Do not share this medicine with others. What if I miss a dose? It is important not to miss your dose. Call your doctor or health care professional if you are unable to keep an appointment. What may interact with this medication? Interactions have not been studied. This list may not describe all possible interactions. Give your health care provider a list of all the medicines, herbs, non-prescription drugs, or dietary supplements you use. Also tell them if you smoke, drink alcohol, or use illegal drugs. Some items may interact with your medicine. What should I watch for while using this medication? Your condition will be monitored carefully while you are receiving this medicine. You may need blood work done while you are taking this medicine. Do not become pregnant while taking this medicine or for at least 5 months after stopping it. Women should inform their doctor if they wish to become pregnant or think they might be pregnant. There is a potential for serious side effects to an unborn child. Talk to your health care professional or pharmacist for more information. Do not  breast-feed an infant while taking this medicine or for at least 5 months after the last dose. What side effects may I notice from receiving this medication? Side effects that you should report to your doctor or health care professional as soon as possible: allergic reactions like skin rash, itching or hives, swelling of the face, lips, or tongue black, tarry stools bloody or watery diarrhea breathing problems changes in vision chest pain or chest tightness chills facial flushing fever headache signs and symptoms of high blood sugar such as dizziness; dry mouth; dry skin; fruity breath; nausea; stomach pain; increased hunger or thirst; increased urination signs and symptoms of liver injury like dark yellow or brown urine; general ill feeling or flu-like symptoms; light-colored stools; loss of appetite; nausea; right upper belly pain; unusually weak or tired; yellowing of the eyes or skin stomach pain trouble passing urine or change in the amount of urine Side effects that usually do not require medical attention (report to your doctor or health care professional if they continue or are bothersome): bone pain cough diarrhea joint pain muscle pain muscle weakness swelling of arms or legs tiredness weight loss This list may not describe all possible side effects. Call your doctor for medical advice about side effects. You may report side effects to FDA at 1-800-FDA-1088. Where should I keep my medication? This drug  is given in a hospital or clinic and will not be stored at home. NOTE: This sheet is a summary. It may not cover all possible information. If you have questions about this medicine, talk to your doctor, pharmacist, or health care provider.  2022 Elsevier/Gold Standard (2019-10-22 13:59:34)

## 2020-11-01 NOTE — Progress Notes (Signed)
Hematology/Oncology Consult Note [facility]  Telephone:([phone] Fax:([phone]) [place] January 25, 1951 [id number]  Date of Visit: 11/01/20  Chief Complaint: On treatment assessment prior to cycle 37 of maintenance Tecentriq  History of present illness: patient is a 70 year old Anne [name] with a past medical history significant for hypertension hyperlipidemia obesity and cirrhosis of the liver among other medical problems.  She has been referred to [place] for findings of bone metastases and her recent MRI.  She has a prior history of 3 packs/day day smoking for over 45 years and quit smoking 5 years ago.She had a CT chest abdomen pelvis in 2018 which showed a 5 mm lung nodule in the left lower lobe.  Recently over the last 2 months patient has been having worsening back pain and was referred to orthopedics.  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  Pathologic fracture of S1.   PET scan showed 2 RUL lung nodules, hilar and mediastinal adenopathy and widespread bone mets. MRI brain negative.   Patient completed palliative RT to her spine. Bronchoscopy showed small cell lung cancer.  Palliative carboplatin, etoposide and Tecentriq started on 08/18/2018. Scans after 4 cycles showed stable disease. She is on maintenance tecentriq  Interval history: Patient is 70 year old Anne [name] with above history of small cell lung cancer currently receiving maintenance Tecentriq who returns to clinic for labs, further evaluation, and consideration of maintenance Tecentriq.  She continues to feel well.  No cough or shortness of breath.  No fevers or chills.  No unintentional weight loss.  Has been tolerating Tecentriq well without significant side effects.   Review of Systems  Constitutional:  Negative for chills, fever, malaise/fatigue and weight  loss.  HENT:  Negative for congestion, ear discharge and nosebleeds.   Eyes:  Negative for blurred vision.  Respiratory:  Negative for cough, hemoptysis, sputum production, shortness of breath and wheezing.   Cardiovascular:  Negative for chest pain, palpitations, orthopnea and claudication.  Gastrointestinal:  Negative for abdominal pain, blood in stool, constipation, diarrhea, heartburn, melena, nausea and vomiting.  Genitourinary:  Negative for dysuria, flank pain, frequency, hematuria and urgency.  Musculoskeletal:  Negative for back pain, joint pain and myalgias.  Skin:  Negative for rash.  Neurological:  Negative for dizziness, tingling, focal weakness, seizures, weakness and headaches.  Endo/Heme/Allergies:  Does not bruise/bleed easily.  Psychiatric/Behavioral:  Negative for depression and suicidal ideas. The patient does not have insomnia.    Allergies  Allergen Reactions   Bee Venom Swelling    Past Medical History:  Diagnosis Date   Arthritis    Cirrhosis ([place])    Depression    GERD (gastroesophageal reflux disease)    Hyperlipidemia    Hypertension    Lung cancer ([place])    Metastatic bone cancer ([place])     Past Surgical History:  Procedure Laterality Date   APPENDECTOMY  1971   CHOLECYSTECTOMY  1971   COLONOSCOPY WITH PROPOFOL N/A 11/15/2016   Procedure: COLONOSCOPY WITH PROPOFOL;  Surgeon: Jonathon Bellows, MD;  Location: [facility] ENDOSCOPY;  Service: Gastroenterology;  Laterality: N/A;   ENDOBRONCHIAL ULTRASOUND Right 07/30/2018   Procedure: ENDOBRONCHIAL ULTRASOUND;  Surgeon: Laverle Hobby, MD;  Location: [facility] ORS;  Service: Pulmonary;  Laterality: Right;   ESOPHAGOGASTRODUODENOSCOPY (EGD) WITH PROPOFOL N/A 01/07/2017   Procedure: ESOPHAGOGASTRODUODENOSCOPY (EGD) WITH PROPOFOL;  Surgeon: Jonathon Bellows, MD;  Location[facility]  ENDOSCOPY;  Service: Gastroenterology;  Laterality: N/A;   LAPAROSCOPY N/A 03/01/2017   Procedure: LAPAROSCOPY DIAGNOSTIC;  Surgeon: Robert Bellow, MD;  Location: [facility] ORS;  Service: General;  Laterality: N/A;   PORTA CATH INSERTION N/A 08/14/2018   Procedure: PORTA CATH INSERTION;  Surgeon: Algernon Huxley, MD;  Location: [facility] CV LAB;  Service: Cardiovascular;  Laterality: N/A;   TONSILECTOMY, ADENOIDECTOMY, BILATERAL MYRINGOTOMY AND TUBES  1955   TONSILLECTOMY     VENTRAL HERNIA REPAIR N/A 03/01/2017   10 x 14 CM Ventralight ST mesh, intraperitoneal location.    VENTRAL HERNIA REPAIR N/A 03/01/2017   Procedure: HERNIA REPAIR VENTRAL ADULT;  Surgeon: Robert Bellow, MD;  Location: [facility] ORS;  Service: General;  Laterality: N/A;    Social History   Socioeconomic History   Marital status: Married    Spouse name: Johnny   Number of children: Not on file   Years of education: Not on file   Highest education level: Not on file  Occupational History   Not on file  Tobacco Use   Smoking status: Former    Packs/day: 2.00    Years: 50.00    Pack years: 100.00    Types: Cigarettes, E-cigarettes    Quit date: 02/07/2012    Years since quitting: 8.7   Smokeless tobacco: Never  Vaping Use   Vaping Use: Some days   Start date: 10/06/2013   Devices: uses no liquid  Substance and Sexual Activity   Alcohol use: No    Alcohol/week: 0.0 standard drinks   Drug use: No   Sexual activity: Yes  Other Topics Concern   Not on file  Social History Narrative   Married   Retired   Clinical cytogeneticist level of education    No children    1 cup of coffee   Social Determinants of Radio broadcast assistant Strain: Low Risk    Difficulty of Paying Living Expenses: Not hard at all  Food Insecurity: No Food Insecurity   Worried About Charity fundraiser in the Last Year: Never true   Arboriculturist in the Last Year: Never true  Transportation Needs: No Transportation Needs   Lack of Transportation (Medical): No   Lack of Transportation (Non-Medical): No  Physical Activity: Not on file  Stress: No Stress Concern Present   Feeling  of Stress : Not at all  Social Connections: Unknown   Frequency of Communication with Friends and Family: More than three times a week   Frequency of Social Gatherings with Friends and Family: Not on file   Attends Religious Services: Not on Electrical engineer or Organizations: Not on file   Attends Archivist Meetings: Not on file   Marital Status: Married  Human resources officer Violence: Not At Risk   Fear of Current or Ex-Partner: No   Emotionally Abused: No   Physically Abused: No   Sexually Abused: No    Family History  Problem Relation Age of Onset   Hypertension Mother    Ovarian cancer Mother 66   Heart disease Father    Stroke Father    Ovarian cancer Sister        ? dx cancer had hyst.   Breast cancer Neg Hx      Current Outpatient Medications:    amLODipine (NORVASC) 5 MG tablet, Take 1 tablet (5 mg total) by mouth daily., Disp: 90 tablet, Rfl: 3   buPROPion (WELLBUTRIN XL)  150 MG 24 hr tablet, Take 1 tablet (150 mg total) by mouth daily., Disp: 90 tablet, Rfl: 1   clobetasol cream (TEMOVATE) 1.61 %, Apply 1 application topically 2 (two) times daily. Lower back, Disp: 60 g, Rfl: 1   erythromycin ophthalmic ointment, Place 1 application into the right eye daily as needed., Disp: , Rfl:    escitalopram (LEXAPRO) 20 MG tablet, Take 1 tablet (20 mg total) by mouth daily., Disp: 90 tablet, Rfl: 1   fluticasone (FLONASE) 50 MCG/ACT nasal spray, Place 2 sprays into both nostrils daily., Disp: 16 g, Rfl: 6   folic acid (FOLVITE) 1 MG tablet, Take 1 tablet by mouth once daily, Disp: 90 tablet, Rfl: 0   ibuprofen (ADVIL,MOTRIN) 200 MG tablet, Take 400 mg by mouth every 8 (eight) hours as needed for headache or moderate pain. , Disp: , Rfl:    levothyroxine (SYNTHROID) 125 MCG tablet, Take 125 mcg by mouth daily before breakfast., Disp: , Rfl:    lidocaine-prilocaine (EMLA) cream, Apply to affected area once, Disp: 30 g, Rfl: 3   loperamide (IMODIUM) 2 MG  capsule, Take 1 capsule (2 mg total) by mouth every 2 (two) hours as needed for diarrhea or loose stools. Do not exceed 16mg  daily., Disp: 30 capsule, Rfl: 1   Multiple Vitamins-Minerals (PRESERVISION AREDS 2) CAPS, Take 1 capsule by mouth 2 (two) times a day. , Disp: , Rfl:    ondansetron (ZOFRAN ODT) 4 MG disintegrating tablet, Take 1 tablet (4 mg total) by mouth every 8 (eight) hours as needed for nausea or vomiting., Disp: 20 tablet, Rfl: 0   pantoprazole (PROTONIX) 40 MG tablet, TAKE 1 TABLET BY MOUTH TWICE DAILY BEFORE A MEAL, Disp: 180 tablet, Rfl: 0   polyethylene glycol (MIRALAX / GLYCOLAX) 17 g packet, Take 17 g by mouth daily as needed for mild constipation., Disp: 14 each, Rfl: 0   rosuvastatin (CRESTOR) 20 MG tablet, Take 1 tablet by mouth once daily, Disp: 90 tablet, Rfl: 0   valACYclovir (VALTREX) 1000 MG tablet, Take 1 tablet (1,000 mg total) by mouth 3 (three) times daily. With food x 7-10 days, Disp: 30 tablet, Rfl: 0 No current facility-administered medications for this visit.  Facility-Administered Medications Ordered in Other Visits:    denosumab (XGEVA) injection 120 mg, 120 mg, Subcutaneous, Q30 days, Sindy Guadeloupe, MD, 120 mg at 03/05/19 1525  No results found.   Observation/objective: Today's Vitals   11/01/20 0907  BP: 138/82  Pulse: 87  Resp: 16  Temp: 99 F (37.2 C)  SpO2: 98%  Weight: 174 lb 12.8 oz (79.3 kg)  PainSc: 0-No pain   Body mass index is 28.21 kg/m. ECOG: 0  Physical Exam Nursing note reviewed.  Constitutional:      Appearance: She is not ill-appearing.  HENT:     Head: Normocephalic.  Eyes:     General: No scleral icterus.    Conjunctiva/sclera: Conjunctivae normal.  Cardiovascular:     Rate and Rhythm: Normal rate and regular rhythm.     Pulses: Normal pulses.  Pulmonary:     Effort: Pulmonary effort is normal. No respiratory distress.     Breath sounds: Normal breath sounds.  Abdominal:     General: There is no distension.      Tenderness: There is no abdominal tenderness. There is no guarding.  Musculoskeletal:        General: No swelling or deformity.     Cervical back: Neck supple.  Lymphadenopathy:  Cervical: No cervical adenopathy.  Skin:    General: Skin is warm and dry.     Coloration: Skin is not pale.     Findings: No rash.  Neurological:     Mental Status: She is alert and oriented to person, place, and time.  Psychiatric:        Behavior: Behavior normal.        Thought Content: Thought content normal.        Judgment: Judgment normal.    CBC EXTENDED Latest Ref Rng & Units 11/01/2020 09/26/2020 09/05/2020  WBC 4.0 - 10.5 K/uL 7.1 4.4 3.7(L)  RBC 3.87 - 5.11 MIL/uL 4.53 4.31 4.24  HGB 12.0 - 15.0 g/dL 12.2 11.8(L) 11.7(L)  HCT 36.0 - 46.0 % 38.8 37.8 37.6  PLT 150 - 400 K/uL 159 162 154  NEUTROABS 1.7 - 7.7 K/uL 6.0 3.1 2.5  LYMPHSABS 0.7 - 4.0 K/uL 0.6(L) 0.8 0.9    CMP Latest Ref Rng & Units 11/01/2020 09/26/2020 09/05/2020  Glucose 70 - 99 mg/dL 110(H) 103(H) 106(H)  BUN 8 - 23 mg/dL 15 17 16   Creatinine 0.Anne - 1.00 mg/dL 0.84 0.75 0.83  Sodium 135 - 145 mmol/L 138 139 141  Potassium 3.5 - 5.1 mmol/L 3.8 3.9 4.1  Chloride 98 - 111 mmol/L 98 101 101  CO2 22 - 32 mmol/L 29 32 30  Calcium 8.9 - 10.3 mg/dL 9.1 9.2 9.0  Total Protein 6.5 - 8.1 g/dL 7.4 7.2 7.0  Total Bilirubin 0.3 - 1.2 mg/dL 0.6 0.8 0.2(L)  Alkaline Phos 38 - 126 U/L 109 94 97  AST 15 - 41 U/L 22 26 27   ALT 0 - Anne U/L 13 18 20       Assessment and plan: 70 year old Anne [name] with history of extensive stage small cell lung cancer with bone mets, who returns to clinic for treatment assessment prior to cycle 37 of maintenance Tecentriq.  Labs reviewed and counts acceptable to proceed with cycle 37 of maintenance Tecentriq today.  Plan reviewed to continue until progressive disease or toxicity.  Previous imaging including CT chest abdomen pelvis and bone scan showed overall stable disease.  MRI of the brain showed no evidence  of distant metastatic disease.  Plan to repeat scans prior to next cycle.  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  Again encouraged the importance of taking levothyroxine daily on an empty stomach without other medications due to interactions.  Plan to recheck TSH at next visit  Follow-up instructions: CT Chest abdomen pelvis NM Bone Scan 3 weeks- lab (cbc, cmp, tsh), see rao, tecentriq   I discussed the assessment and treatment plan with the patient. The patient was provided an opportunity to ask questions and all were answered. The patient agreed with the plan and demonstrated an understanding of the instructions.   The patient was advised to call back or seek an in-person evaluation if the symptoms worsen or if the condition fails to improve as anticipated.  Visit Diagnosis: 1. Small cell lung cancer, right upper lobe ([place])   2. Encounter for antineoplastic immunotherapy   3. Autoimmune hypothyroidism    Beckey Rutter, DNP, AGNP-C [place] at [facility] [phone] (clinic) 11/01/2020 9:23 AM

## 2020-11-01 NOTE — Telephone Encounter (Signed)
I called the pharmacy and they stated she had picked up the medication.  Anne [name],cma

## 2020-11-05 ENCOUNTER — Other Ambulatory Visit: Payer: Self-pay | Admitting: Oncology

## 2020-11-06 ENCOUNTER — Encounter: Payer: Self-pay | Admitting: Oncology

## 2020-11-07 ENCOUNTER — Encounter: Payer: Self-pay | Admitting: *Deleted

## 2020-11-14 ENCOUNTER — Encounter
Admission: RE | Admit: 2020-11-14 | Discharge: 2020-11-14 | Disposition: A | Discharge status: Home / Self Care | Payer: PPO | Source: Ambulatory Visit | Attending: Nurse Practitioner | Admitting: Nurse Practitioner

## 2020-11-14 ENCOUNTER — Ambulatory Visit
Admission: RE | Admit: 2020-11-14 | Discharge: 2020-11-14 | Disposition: A | Discharge status: Home / Self Care | Payer: PPO | Source: Ambulatory Visit | Attending: Nurse Practitioner | Admitting: Nurse Practitioner

## 2020-11-14 ENCOUNTER — Other Ambulatory Visit: Payer: Self-pay

## 2020-11-14 DIAGNOSIS — K7689 Other specified diseases of liver: Secondary | ICD-10-CM | POA: Diagnosis not present

## 2020-11-14 DIAGNOSIS — C3490 Malignant neoplasm of unspecified part of unspecified bronchus or lung: Secondary | ICD-10-CM | POA: Diagnosis not present

## 2020-11-14 DIAGNOSIS — C3411 Malignant neoplasm of upper lobe, right bronchus or lung: Secondary | ICD-10-CM

## 2020-11-14 DIAGNOSIS — I358 Other nonrheumatic aortic valve disorders: Secondary | ICD-10-CM | POA: Diagnosis not present

## 2020-11-14 DIAGNOSIS — C7951 Secondary malignant neoplasm of bone: Secondary | ICD-10-CM | POA: Diagnosis not present

## 2020-11-14 DIAGNOSIS — I2510 Atherosclerotic heart disease of native coronary artery without angina pectoris: Secondary | ICD-10-CM | POA: Diagnosis not present

## 2020-11-14 MED ORDER — IOHEXOL 350 MG/ML SOLN
75.0000 mL | Freq: Once | INTRAVENOUS | Status: AC | PRN
  Administered 2020-11-14: 75 mL via INTRAVENOUS

## 2020-11-14 MED ORDER — TECHNETIUM TC 99M MEDRONATE IV KIT
20.0000 | PACK | Freq: Once | INTRAVENOUS | Status: AC | PRN
  Administered 2020-11-14: 22 via INTRAVENOUS

## 2020-11-22 ENCOUNTER — Encounter: Payer: Self-pay | Admitting: Oncology

## 2020-11-22 ENCOUNTER — Inpatient Hospital Stay: Payer: PPO | Attending: Oncology

## 2020-11-22 ENCOUNTER — Inpatient Hospital Stay: Payer: PPO

## 2020-11-22 ENCOUNTER — Other Ambulatory Visit: Payer: Self-pay

## 2020-11-22 ENCOUNTER — Inpatient Hospital Stay (HOSPITAL_BASED_OUTPATIENT_CLINIC_OR_DEPARTMENT_OTHER): Payer: PPO | Admitting: Oncology

## 2020-11-22 ENCOUNTER — Other Ambulatory Visit: Payer: Self-pay | Admitting: Oncology

## 2020-11-22 ENCOUNTER — Other Ambulatory Visit: Payer: Self-pay | Admitting: Family Medicine

## 2020-11-22 VITALS — BP 125/72 | HR 73 | Temp 97.4°F | Resp 18 | Wt 176.0 lb

## 2020-11-22 DIAGNOSIS — C3411 Malignant neoplasm of upper lobe, right bronchus or lung: Secondary | ICD-10-CM | POA: Insufficient documentation

## 2020-11-22 DIAGNOSIS — I10 Essential (primary) hypertension: Secondary | ICD-10-CM | POA: Diagnosis not present

## 2020-11-22 DIAGNOSIS — E063 Autoimmune thyroiditis: Secondary | ICD-10-CM

## 2020-11-22 DIAGNOSIS — Z79899 Other long term (current) drug therapy: Secondary | ICD-10-CM | POA: Diagnosis not present

## 2020-11-22 DIAGNOSIS — E785 Hyperlipidemia, unspecified: Secondary | ICD-10-CM | POA: Insufficient documentation

## 2020-11-22 DIAGNOSIS — E669 Obesity, unspecified: Secondary | ICD-10-CM | POA: Insufficient documentation

## 2020-11-22 DIAGNOSIS — Z87891 Personal history of nicotine dependence: Secondary | ICD-10-CM | POA: Insufficient documentation

## 2020-11-22 DIAGNOSIS — Z923 Personal history of irradiation: Secondary | ICD-10-CM | POA: Diagnosis not present

## 2020-11-22 DIAGNOSIS — Z9049 Acquired absence of other specified parts of digestive tract: Secondary | ICD-10-CM | POA: Diagnosis not present

## 2020-11-22 DIAGNOSIS — Z79890 Hormone replacement therapy (postmenopausal): Secondary | ICD-10-CM | POA: Insufficient documentation

## 2020-11-22 DIAGNOSIS — C7951 Secondary malignant neoplasm of bone: Secondary | ICD-10-CM | POA: Diagnosis not present

## 2020-11-22 DIAGNOSIS — Z5112 Encounter for antineoplastic immunotherapy: Secondary | ICD-10-CM

## 2020-11-22 DIAGNOSIS — K7460 Unspecified cirrhosis of liver: Secondary | ICD-10-CM | POA: Insufficient documentation

## 2020-11-22 DIAGNOSIS — Z8041 Family history of malignant neoplasm of ovary: Secondary | ICD-10-CM | POA: Diagnosis not present

## 2020-11-22 LAB — CBC WITH DIFFERENTIAL/PLATELET
Abs Immature Granulocytes: 0.02 10*3/uL (ref 0.00–0.07)
Basophils Absolute: 0.1 10*3/uL (ref 0.0–0.1)
Basophils Relative: 1 %
Eosinophils Absolute: 0 10*3/uL (ref 0.0–0.5)
Eosinophils Relative: 1 %
HCT: 40.4 % (ref 36.0–46.0)
Hemoglobin: 12.4 g/dL (ref 12.0–15.0)
Immature Granulocytes: 1 %
Lymphocytes Relative: 21 %
Lymphs Abs: 0.9 10*3/uL (ref 0.7–4.0)
MCH: 26.6 pg (ref 26.0–34.0)
MCHC: 30.7 g/dL (ref 30.0–36.0)
MCV: 86.5 fL (ref 80.0–100.0)
Monocytes Absolute: 0.3 10*3/uL (ref 0.1–1.0)
Monocytes Relative: 7 %
Neutro Abs: 3 10*3/uL (ref 1.7–7.7)
Neutrophils Relative %: 69 %
Platelets: 145 10*3/uL — ABNORMAL LOW (ref 150–400)
RBC: 4.67 MIL/uL (ref 3.87–5.11)
RDW: 14.8 % (ref 11.5–15.5)
WBC: 4.3 10*3/uL (ref 4.0–10.5)
nRBC: 0 % (ref 0.0–0.2)

## 2020-11-22 LAB — COMPREHENSIVE METABOLIC PANEL
ALT: 14 U/L (ref 0–44)
AST: 22 U/L (ref 15–41)
Albumin: 3.7 g/dL (ref 3.5–5.0)
Alkaline Phosphatase: 148 U/L — ABNORMAL HIGH (ref 38–126)
Anion gap: 6 (ref 5–15)
BUN: 21 mg/dL (ref 8–23)
CO2: 31 mmol/L (ref 22–32)
Calcium: 8.8 mg/dL — ABNORMAL LOW (ref 8.9–10.3)
Chloride: 102 mmol/L (ref 98–111)
Creatinine, Ser: 0.77 mg/dL (ref 0.44–1.00)
GFR, Estimated: >60 mL/min (ref >60)
Glucose, Bld: 97 mg/dL (ref 70–99)
Potassium: 4 mmol/L (ref 3.5–5.1)
Sodium: 139 mmol/L (ref 135–145)
Total Bilirubin: 0.5 mg/dL (ref 0.3–1.2)
Total Protein: 7.5 g/dL (ref 6.5–8.1)

## 2020-11-22 LAB — TSH: TSH: 0.118 u[IU]/mL — ABNORMAL LOW (ref 0.350–4.500)

## 2020-11-22 MED ORDER — HEPARIN SOD (PORK) LOCK FLUSH 100 UNIT/ML IV SOLN
500.0000 [IU] | Freq: Once | INTRAVENOUS | Status: AC | PRN
  Filled 2020-11-22: qty 5

## 2020-11-22 MED ORDER — HEPARIN SOD (PORK) LOCK FLUSH 100 UNIT/ML IV SOLN
INTRAVENOUS | Status: AC
  Administered 2020-11-22: 500 [IU]
  Filled 2020-11-22: qty 5

## 2020-11-22 MED ORDER — SODIUM CHLORIDE 0.9 % IV SOLN
Freq: Once | INTRAVENOUS | Status: AC
  Filled 2020-11-22: qty 250

## 2020-11-22 MED ORDER — SODIUM CHLORIDE 0.9 % IV SOLN
1200.0000 mg | Freq: Once | INTRAVENOUS | Status: AC
  Administered 2020-11-22: 1200 mg via INTRAVENOUS
  Filled 2020-11-22: qty 20

## 2020-11-22 NOTE — Progress Notes (Signed)
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  She has been referred to [place] for findings of bone metastases and her recent MRI.  She has a prior history of 3 packs/day day smoking for over 45 years and quit smoking 5 years ago.She had a CT chest abdomen pelvis in 2018 which showed a 5 mm lung nodule in the left lower lobe.  Recently over the last 2 months patient has been having worsening back pain and was referred to orthopedics.  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  Pathologic fracture of S1.   PET scan showed 2 RUL lung nodules, hilar and mediastinal adenopathy and widespread bone mets. MRI brain negative.   Patient completed palliative RT to her spine. Bronchoscopy showed small cell lung cancer.  Palliative carboplatin, etoposide and Tecentriq started on 08/18/2018. Scans after 4 cycles showed stable disease. She is on maintenance tecentriq Patient has autoimmune hypothyroidism for which she is on levothyroxine but is not compliant with her medication  Interval history-patient states that she has been taking thyroid  medication consistently without missing any doses.  She did not follow-up with Dr. Gabriel Carina recently.  Denies any complaints presently  ECOG PS- 1 Pain scale- 0   Review of systems- Review of Systems  Constitutional:  Negative for chills, fever, malaise/fatigue and weight loss.  HENT:  Negative for congestion, ear discharge and nosebleeds.   Eyes:  Negative for blurred vision.  Respiratory:  Negative for cough, hemoptysis, sputum production, shortness of breath and wheezing.   Cardiovascular:  Negative for chest pain, palpitations, orthopnea and claudication.  Gastrointestinal:  Negative for abdominal pain, blood in stool, constipation, diarrhea, heartburn, melena, nausea and vomiting.  Genitourinary:  Negative for dysuria, flank pain, frequency, hematuria and urgency.  Musculoskeletal:  Negative for back pain, joint pain and myalgias.  Skin:  Negative for rash.  Neurological:  Negative for dizziness, tingling, focal weakness, seizures, weakness and headaches.  Endo/Heme/Allergies:  Does not bruise/bleed easily.  Psychiatric/Behavioral:  Negative for depression and suicidal ideas. The patient does not have insomnia.      Allergies  Allergen Reactions   Bee Venom Swelling     Past Medical History:  Diagnosis Date   Arthritis    Cirrhosis ([place])    Depression    GERD (gastroesophageal reflux disease)    Hyperlipidemia    Hypertension    Lung cancer ([place])    Metastatic bone cancer ([place])      Past Surgical History:  Procedure Laterality Date   APPENDECTOMY  1971   CHOLECYSTECTOMY  1971   COLONOSCOPY WITH PROPOFOL N/A 11/15/2016   Procedure: COLONOSCOPY WITH PROPOFOL;  Surgeon: Jonathon Bellows, MD;  Location: [facility] ENDOSCOPY;  Service: Gastroenterology;  Laterality:  N/A;   ENDOBRONCHIAL ULTRASOUND Right 07/30/2018   Procedure: ENDOBRONCHIAL ULTRASOUND;  Surgeon: Laverle Hobby, MD;  Location: [facility] ORS;  Service: Pulmonary;  Laterality: Right;   ESOPHAGOGASTRODUODENOSCOPY (EGD)  WITH PROPOFOL N/A 01/07/2017   Procedure: ESOPHAGOGASTRODUODENOSCOPY (EGD) WITH PROPOFOL;  Surgeon: Jonathon Bellows, MD;  Location: [facility] ENDOSCOPY;  Service: Gastroenterology;  Laterality: N/A;   LAPAROSCOPY N/A 03/01/2017   Procedure: LAPAROSCOPY DIAGNOSTIC;  Surgeon: Robert Bellow, MD;  Location: [facility] ORS;  Service: General;  Laterality: N/A;   PORTA CATH INSERTION N/A 08/14/2018   Procedure: PORTA CATH INSERTION;  Surgeon: Algernon Huxley, MD;  Location: [facility];  Service: Cardiovascular;  Laterality: N/A;   TONSILECTOMY, ADENOIDECTOMY, BILATERAL MYRINGOTOMY AND TUBES  1955   TONSILLECTOMY     VENTRAL HERNIA REPAIR N/A 03/01/2017   10 x 14 CM Ventralight ST mesh, intraperitoneal location.    VENTRAL HERNIA REPAIR N/A 03/01/2017   Procedure: HERNIA REPAIR VENTRAL ADULT;  Surgeon: Robert Bellow, MD;  Location: [facility] ORS;  Service: General;  Laterality: N/A;    Social History   Socioeconomic History   Marital status: Married    Spouse name: Johnny   Number of children: Not on file   Years of education: Not on file   Highest education level: Not on file  Occupational History   Not on file  Tobacco Use   Smoking status: Former    Packs/day: 2.00    Years: 50.00    Pack years: 100.00    Types: Cigarettes, E-cigarettes    Quit date: 02/07/2012    Years since quitting: 8.7   Smokeless tobacco: Never  Vaping Use   Vaping Use: Some days   Start date: 10/06/2013   Devices: uses no liquid  Substance and Sexual Activity   Alcohol use: No    Alcohol/week: 0.0 standard drinks   Drug use: No   Sexual activity: Yes  Other Topics Concern   Not on file  Social History Narrative   Married   Retired   Clinical cytogeneticist level of education    No children    1 cup of coffee   Social Determinants of Radio broadcast assistant Strain: Low Risk    Difficulty of Paying Living Expenses: Not hard at all  Food Insecurity: No Food Insecurity   Worried About Charity fundraiser in  the Last Year: Never true   Arboriculturist in the Last Year: Never true  Transportation Needs: No Transportation Needs   Lack of Transportation (Medical): No   Lack of Transportation (Non-Medical): No  Physical Activity: Not on file  Stress: No Stress Concern Present   Feeling of Stress : Not at all  Social Connections: Unknown   Frequency of Communication with Friends and Family: More than three times a week   Frequency of Social Gatherings with Friends and Family: Not on file   Attends Religious Services: Not on file   Active Member of Clubs or Organizations: Not on file   Attends Archivist Meetings: Not on file   Marital Status: Married  Human resources officer Violence: Not At Risk   Fear of Current or Ex-Partner: No   Emotionally Abused: No   Physically Abused: No   Sexually Abused: No    Family History  Problem Relation Age of Onset   Hypertension Mother    Ovarian cancer Mother 67   Heart disease Father    Stroke Father    Ovarian cancer Sister        ?  dx cancer had hyst.   Breast cancer Neg Hx      Current Outpatient Medications:    amLODipine (NORVASC) 5 MG tablet, Take 1 tablet (5 mg total) by mouth daily., Disp: 90 tablet, Rfl: 3   buPROPion (WELLBUTRIN XL) 150 MG 24 hr tablet, Take 1 tablet (150 mg total) by mouth daily., Disp: 90 tablet, Rfl: 1   clobetasol cream (TEMOVATE) 7.04 %, Apply 1 application topically 2 (two) times daily. Lower back, Disp: 60 g, Rfl: 1   escitalopram (LEXAPRO) 20 MG tablet, Take 1 tablet (20 mg total) by mouth daily., Disp: 90 tablet, Rfl: 1   folic acid (FOLVITE) 1 MG tablet, Take 1 tablet by mouth once daily, Disp: 90 tablet, Rfl: 0   ibuprofen (ADVIL,MOTRIN) 200 MG tablet, Take 400 mg by mouth every 8 (eight) hours as needed for headache or moderate pain. , Disp: , Rfl:    levothyroxine (SYNTHROID) 125 MCG tablet, Take 125 mcg by mouth daily before breakfast., Disp: , Rfl:    lidocaine-prilocaine (EMLA) cream, Apply to  affected area once, Disp: 30 g, Rfl: 3   loperamide (IMODIUM) 2 MG capsule, Take 1 capsule (2 mg total) by mouth every 2 (two) hours as needed for diarrhea or loose stools. Do not exceed 81m daily., Disp: 30 capsule, Rfl: 1   Multiple Vitamins-Minerals (PRESERVISION AREDS 2) CAPS, Take 1 capsule by mouth 2 (two) times a day. , Disp: , Rfl:    rosuvastatin (CRESTOR) 20 MG tablet, Take 1 tablet by mouth once daily, Disp: 90 tablet, Rfl: 0   BINAXNOW COVID-19 AG HOME TEST KIT, Use as Directed on the Package (Patient not taking: Reported on 11/22/2020), Disp: , Rfl:    erythromycin ophthalmic ointment, Place 1 application into the right eye daily as needed. (Patient not taking: Reported on 11/22/2020), Disp: , Rfl:    fluticasone (FLONASE) 50 MCG/ACT nasal spray, Place 2 sprays into both nostrils daily. (Patient not taking: Reported on 11/22/2020), Disp: 16 g, Rfl: 6   ondansetron (ZOFRAN ODT) 4 MG disintegrating tablet, Take 1 tablet (4 mg total) by mouth every 8 (eight) hours as needed for nausea or vomiting. (Patient not taking: Reported on 11/22/2020), Disp: 20 tablet, Rfl: 0   pantoprazole (PROTONIX) 40 MG tablet, TAKE 1 TABLET BY MOUTH TWICE DAILY BEFORE A MEAL (Patient not taking: Reported on 11/22/2020), Disp: 180 tablet, Rfl: 0   polyethylene glycol (MIRALAX / GLYCOLAX) 17 g packet, Take 17 g by mouth daily as needed for mild constipation. (Patient not taking: Reported on 11/22/2020), Disp: 14 each, Rfl: 0   valACYclovir (VALTREX) 1000 MG tablet, Take 1 tablet (1,000 mg total) by mouth 3 (three) times daily. With food x 7-10 days (Patient not taking: Reported on 11/22/2020), Disp: 30 tablet, Rfl: 0 No current facility-administered medications for this visit.  Facility-Administered Medications Ordered in Other Visits:    denosumab (XGEVA) injection 120 mg, 120 mg, Subcutaneous, Q30 days, RSindy Guadeloupe MD, 120 mg at 03/05/19 1525  Physical exam:  Vitals:   11/22/20 0938  BP: 125/72  Pulse:  73  Resp: 18  Temp: (!) 97.4 F (36.3 C)  SpO2: 97%  Weight: 176 lb (79.8 kg)   Physical Exam Constitutional:      General: She is not in acute distress. Cardiovascular:     Rate and Rhythm: Normal rate and regular rhythm.     Heart sounds: Normal heart sounds.  Pulmonary:     Effort: Pulmonary effort is normal.  Breath sounds: Normal breath sounds.  Abdominal:     General: Bowel sounds are normal.     Palpations: Abdomen is soft.  Skin:    General: Skin is warm and dry.  Neurological:     Mental Status: She is alert and oriented to person, place, and time.     CMP Latest Ref Rng & Units 11/22/2020  Glucose 70 - 99 mg/dL 97  BUN 8 - 23 mg/dL 21  Creatinine 0.44 - 1.00 mg/dL 0.77  Sodium 135 - 145 mmol/L 139  Potassium 3.5 - 5.1 mmol/L 4.0  Chloride 98 - 111 mmol/L 102  CO2 22 - 32 mmol/L 31  Calcium 8.9 - 10.3 mg/dL 8.8(L)  Total Protein 6.5 - 8.1 g/dL 7.5  Total Bilirubin 0.3 - 1.2 mg/dL 0.5  Alkaline Phos 38 - 126 U/L 148(H)  AST 15 - 41 U/L 22  ALT 0 - 44 U/L 14   CBC Latest Ref Rng & Units 11/22/2020  WBC 4.0 - 10.5 K/uL 4.3  Hemoglobin 12.0 - 15.0 g/dL 12.4  Hematocrit 36.0 - 46.0 % 40.4  Platelets 150 - 400 K/uL 145(L)    No images are attached to the encounter.  NM Bone Scan Whole Body  Result Date: 11/16/2020 CLINICAL DATA:  Small cell lung cancer. Osseous metastatic disease. Follow-up examination EXAM: NUCLEAR MEDICINE WHOLE BODY BONE SCAN TECHNIQUE: Whole body anterior and posterior images were obtained approximately 3 hours after intravenous injection of radiopharmaceutical. RADIOPHARMACEUTICALS:  22.0 mCi Technetium-107mMDP IV COMPARISON:  08/01/2020 FINDINGS: Scattered foci of uptake within the spine, left fifth rib and right sixth ribs anterolaterally, left humerus are again noted with interval improvement in uptake within the left humeral lesion. There is, however, a new focus of uptake within the right calvarium likely corresponding to the new  osseous metastasis within the right mandibular head seen on MRI examination of 08/09/2020. Additional uptake within the maxilla and mandible likely relates to periodontal disease. Normal soft tissue distribution. Normal uptake and excretion within the kidneys and bladder. IMPRESSION: Mixed response to therapy. Interval improvement in uptake within the left humerus but development of a new focus of osseous metastasis within the expected location of the right mandibular head. Stable uptake scattered within the thoracolumbar spine and ribs bilaterally. Electronically Signed   By: AFidela SalisburyM.D.   On: 11/16/2020 02:45   CT CHEST ABDOMEN PELVIS W CONTRAST  Result Date: 11/15/2020 CLINICAL DATA:  Metastatic right upper lobe small-cell lung cancer restaging EXAM: CT CHEST, ABDOMEN, AND PELVIS WITH CONTRAST TECHNIQUE: Multidetector CT imaging of the chest, abdomen and pelvis was performed following the standard protocol during bolus administration of intravenous contrast. CONTRAST:  73mOMNIPAQUE IOHEXOL 350 MG/ML SOLN, additional oral enteric contrast COMPARISON:  08/01/2020 FINDINGS: CT CHEST FINDINGS Cardiovascular: Aortic atherosclerosis. Aortic valve calcifications. Normal heart size. Three-vessel coronary artery calcifications. No pericardial effusion. Mediastinum/Nodes: No enlarged mediastinal, hilar, or axillary lymph nodes. Thyroid gland, trachea, and esophagus demonstrate no significant findings. Lungs/Pleura: Diffuse bilateral bronchial wall thickening. Mild centrilobular emphysema. Stable 3 mm nodule of the posterior right upper lobe (series 3, image 38). Background of numerous very tiny centrilobular pulmonary nodules, most concentrated in the lung apices. No pleural effusion or pneumothorax. Musculoskeletal: No chest wall mass. CT ABDOMEN PELVIS FINDINGS Hepatobiliary: No focal liver abnormality is seen. Coarse, nodular contour of the liver. Status post cholecystectomy. No biliary dilatation.  Pancreas: Unremarkable. No pancreatic ductal dilatation or surrounding inflammatory changes. Spleen: Normal in size without significant abnormality. Adrenals/Urinary Tract: Adrenal  glands are unremarkable. Kidneys are normal, without renal calculi, solid lesion, or hydronephrosis. Bladder is unremarkable. Stomach/Bowel: Stomach is within normal limits. Appendix appears normal. No evidence of bowel wall thickening, distention, or inflammatory changes. Vascular/Lymphatic: Aortic atherosclerosis. No enlarged abdominal or pelvic lymph nodes. Reproductive: No mass or other abnormality. Other: No abdominal wall hernia or abnormality. No abdominopelvic ascites. Musculoskeletal: Diffusely sclerotic osseous metastatic disease. Unchanged sclerotic endplate deformities of T2 (series 6, image 105). Unchanged high-grade endplate deformities of L2. Unchanged superior endplate deformity of S1. IMPRESSION: 1. Primary lung malignancy is not identified on this examination. 2. Stable 3 mm nodule of the posterior right upper lobe. 3. Diffusely sclerotic osseous metastatic disease, unchanged, with multiple vertebral endplate deformities as described above. 4. No evidence of new metastatic disease in the chest, abdomen, or pelvis. 5. Emphysema. 6. Coronary artery disease. Aortic Atherosclerosis (ICD10-I70.0) and Emphysema (ICD10-J43.9). Electronically Signed   By: Delanna Ahmadi M.D.   On: 11/15/2020 11:50     Assessment and plan- Patient is a 70 y.o. female with extensive stage small cell lung cancer with bone metastases here for on treatment assessment prior to next cycle of maintenance Tecentriq  Counts okay to proceed with cycle 36 of palliative Tecentriq today and I will see her back in 3 weeks for cycle 37.  Plan is to continue present treatment until progression or toxicity.  I have reviewed bone scan images as well as CT chest abdomen pelvis images independently and discussed findings with the patient which overall shows  findings of stable disease but she was noted to have a new area of uptake in her right mandibular head.  Given that this is her only new area and she is overall doing so well on this treatment I am not inclined to change Tecentriq at this time.  Also she is relatively asymptomatic and I will hold off on giving her any palliative radiation treatment to this area.We have tried giving her bisphosphonates in the past but she does develop significant hypocalcemia and therefore we have not been able to reinitiate bisphosphonate since then  Autoimmune hypothyroidism: Patient's levothyroxine dose was decreased to 125 mcg by endocrinology but patient did not subsequently follow-up with them.  Today her TSH is Low at 0.118 which is again suggestive of hyperthyroidism.  Clinically patient does not have any Hypothyroid features and she is not tachycardic no diarrhea.  It is difficult to titrate her dosing not knowing if she is truly taking her medications consistently as her values range from hypothyroidism to hyperthyroidism. I will keep her at the present dose for now and repeat TSH again in 6 weeks time.  I have asked her to bring her medication and pillbox with her at her next visit.   Visit Diagnosis 1. Encounter for antineoplastic immunotherapy   2. Small cell lung cancer, right upper lobe ([place])   3. Autoimmune hypothyroidism      Dr. Randa Evens, MD, MPH [facility] at [facility] [id number] 11/22/2020 1:26 PM

## 2020-11-22 NOTE — Patient Instructions (Signed)
[facility] ONCOLOGY  Discharge Instructions: Thank you for choosing [facility] to provide your oncology and hematology care.  If you have a lab appointment with the [facility], please go directly to the [facility] and check in at the registration area.  Wear comfortable clothing and clothing appropriate for easy access to any Portacath or PICC line.   We strive to give you quality time with your provider. You may need to reschedule your appointment if you arrive late (15 or more minutes).  Arriving late affects you and other patients whose appointments are after yours.  Also, if you miss three or more appointments without notifying the office, you may be dismissed from the clinic at the provider's discretion.      For prescription refill requests, have your pharmacy contact our office and allow 72 hours for refills to be completed.    Today you received the following chemotherapy and/or immunotherapy agents Tecentriq      To help prevent nausea and vomiting after your treatment, we encourage you to take your nausea medication as directed.  BELOW ARE SYMPTOMS THAT SHOULD BE REPORTED IMMEDIATELY: *FEVER GREATER THAN 100.4 F (38 C) OR HIGHER *CHILLS OR SWEATING *NAUSEA AND VOMITING THAT IS NOT CONTROLLED WITH YOUR NAUSEA MEDICATION *UNUSUAL SHORTNESS OF BREATH *UNUSUAL BRUISING OR BLEEDING *URINARY PROBLEMS (pain or burning when urinating, or frequent urination) *BOWEL PROBLEMS (unusual diarrhea, constipation, pain near the anus) TENDERNESS IN MOUTH AND THROAT WITH OR WITHOUT PRESENCE OF ULCERS (sore throat, sores in mouth, or a toothache) UNUSUAL RASH, SWELLING OR PAIN  UNUSUAL VAGINAL DISCHARGE OR ITCHING   Items with * indicate a potential emergency and should be followed up as soon as possible or go to the Emergency Department if any problems should occur.  Please show the CHEMOTHERAPY ALERT CARD or IMMUNOTHERAPY ALERT CARD at check-in  to the Emergency Department and triage nurse.  Should you have questions after your visit or need to cancel or reschedule your appointment, please contact [facility]  [phone] and follow the prompts.  Office hours are 8:00 a.m. to 4:30 p.m. Monday - Friday. Please note that voicemails left after 4:00 p.m. may not be returned until the following business day.  We are closed weekends and major holidays. You have access to a nurse at all times for urgent questions. Please call the main number to the clinic [phone] and follow the prompts.  For any non-urgent questions, you may also contact your provider using MyChart. We now offer e-Visits for anyone 65 and older to request care online for non-urgent symptoms. For details visit [email or web address].   Also download the MyChart app! Go to the app store, search "MyChart", open the app, select [facility], and log in with your MyChart username and password.  Due to Covid, a mask is required upon entering the hospital/clinic. If you do not have a mask, one will be given to you upon arrival. For doctor visits, patients may have 1 support person aged 66 or older with them. For treatment visits, patients cannot have anyone with them due to current Covid guidelines and our immunocompromised population.

## 2020-11-23 ENCOUNTER — Encounter: Payer: Self-pay | Admitting: Oncology

## 2020-11-24 ENCOUNTER — Other Ambulatory Visit: Payer: Self-pay | Admitting: Family Medicine

## 2020-11-25 ENCOUNTER — Other Ambulatory Visit: Payer: Self-pay

## 2020-11-25 MED ORDER — LEVOTHYROXINE SODIUM 100 MCG PO TABS: 100.0000 ug | ORAL_TABLET | Freq: Every day | ORAL | 1 refills | Status: DC

## 2020-12-13 ENCOUNTER — Inpatient Hospital Stay: Payer: PPO | Attending: Oncology

## 2020-12-13 ENCOUNTER — Other Ambulatory Visit: Payer: Self-pay | Admitting: Oncology

## 2020-12-13 ENCOUNTER — Other Ambulatory Visit: Payer: Self-pay

## 2020-12-13 ENCOUNTER — Inpatient Hospital Stay: Payer: PPO

## 2020-12-13 ENCOUNTER — Encounter: Payer: Self-pay | Admitting: Oncology

## 2020-12-13 ENCOUNTER — Inpatient Hospital Stay (HOSPITAL_BASED_OUTPATIENT_CLINIC_OR_DEPARTMENT_OTHER): Payer: PPO | Admitting: Oncology

## 2020-12-13 VITALS — BP 116/74 | HR 77 | Temp 98.2°F | Resp 16 | Ht 66.0 in | Wt 180.0 lb

## 2020-12-13 DIAGNOSIS — Z87891 Personal history of nicotine dependence: Secondary | ICD-10-CM | POA: Insufficient documentation

## 2020-12-13 DIAGNOSIS — E063 Autoimmune thyroiditis: Secondary | ICD-10-CM | POA: Diagnosis not present

## 2020-12-13 DIAGNOSIS — C3411 Malignant neoplasm of upper lobe, right bronchus or lung: Secondary | ICD-10-CM | POA: Insufficient documentation

## 2020-12-13 DIAGNOSIS — Z79890 Hormone replacement therapy (postmenopausal): Secondary | ICD-10-CM | POA: Diagnosis not present

## 2020-12-13 DIAGNOSIS — K7460 Unspecified cirrhosis of liver: Secondary | ICD-10-CM | POA: Diagnosis not present

## 2020-12-13 DIAGNOSIS — Z923 Personal history of irradiation: Secondary | ICD-10-CM | POA: Diagnosis not present

## 2020-12-13 DIAGNOSIS — Z79899 Other long term (current) drug therapy: Secondary | ICD-10-CM | POA: Diagnosis not present

## 2020-12-13 DIAGNOSIS — I10 Essential (primary) hypertension: Secondary | ICD-10-CM | POA: Insufficient documentation

## 2020-12-13 DIAGNOSIS — E669 Obesity, unspecified: Secondary | ICD-10-CM | POA: Insufficient documentation

## 2020-12-13 DIAGNOSIS — C7951 Secondary malignant neoplasm of bone: Secondary | ICD-10-CM | POA: Insufficient documentation

## 2020-12-13 DIAGNOSIS — E785 Hyperlipidemia, unspecified: Secondary | ICD-10-CM | POA: Insufficient documentation

## 2020-12-13 DIAGNOSIS — Z9049 Acquired absence of other specified parts of digestive tract: Secondary | ICD-10-CM | POA: Insufficient documentation

## 2020-12-13 DIAGNOSIS — Z5112 Encounter for antineoplastic immunotherapy: Secondary | ICD-10-CM

## 2020-12-13 DIAGNOSIS — Z8041 Family history of malignant neoplasm of ovary: Secondary | ICD-10-CM | POA: Insufficient documentation

## 2020-12-13 DIAGNOSIS — Z95828 Presence of other vascular implants and grafts: Secondary | ICD-10-CM

## 2020-12-13 LAB — CBC WITH DIFFERENTIAL/PLATELET
Abs Immature Granulocytes: 0.01 10*3/uL (ref 0.00–0.07)
Basophils Absolute: 0.1 10*3/uL (ref 0.0–0.1)
Basophils Relative: 1 %
Eosinophils Absolute: 0 10*3/uL (ref 0.0–0.5)
Eosinophils Relative: 0 %
HCT: 39.1 % (ref 36.0–46.0)
Hemoglobin: 12.2 g/dL (ref 12.0–15.0)
Immature Granulocytes: 0 %
Lymphocytes Relative: 18 %
Lymphs Abs: 0.9 10*3/uL (ref 0.7–4.0)
MCH: 26.8 pg (ref 26.0–34.0)
MCHC: 31.2 g/dL (ref 30.0–36.0)
MCV: 85.7 fL (ref 80.0–100.0)
Monocytes Absolute: 0.5 10*3/uL (ref 0.1–1.0)
Monocytes Relative: 9 %
Neutro Abs: 3.7 10*3/uL (ref 1.7–7.7)
Neutrophils Relative %: 72 %
Platelets: 159 10*3/uL (ref 150–400)
RBC: 4.56 MIL/uL (ref 3.87–5.11)
RDW: 15.9 % — ABNORMAL HIGH (ref 11.5–15.5)
WBC: 5.1 10*3/uL (ref 4.0–10.5)
nRBC: 0 % (ref 0.0–0.2)

## 2020-12-13 LAB — COMPREHENSIVE METABOLIC PANEL
ALT: 15 U/L (ref 0–44)
AST: 23 U/L (ref 15–41)
Albumin: 4 g/dL (ref 3.5–5.0)
Alkaline Phosphatase: 153 U/L — ABNORMAL HIGH (ref 38–126)
Anion gap: 6 (ref 5–15)
BUN: 19 mg/dL (ref 8–23)
CO2: 32 mmol/L (ref 22–32)
Calcium: 9.1 mg/dL (ref 8.9–10.3)
Chloride: 102 mmol/L (ref 98–111)
Creatinine, Ser: 0.74 mg/dL (ref 0.44–1.00)
GFR, Estimated: >60 mL/min (ref >60)
Glucose, Bld: 105 mg/dL — ABNORMAL HIGH (ref 70–99)
Potassium: 3.5 mmol/L (ref 3.5–5.1)
Sodium: 140 mmol/L (ref 135–145)
Total Bilirubin: 0.6 mg/dL (ref 0.3–1.2)
Total Protein: 7.6 g/dL (ref 6.5–8.1)

## 2020-12-13 MED ORDER — SODIUM CHLORIDE 0.9 % IV SOLN
1200.0000 mg | Freq: Once | INTRAVENOUS | Status: AC
  Administered 2020-12-13: 1200 mg via INTRAVENOUS
  Filled 2020-12-13: qty 20

## 2020-12-13 MED ORDER — SODIUM CHLORIDE 0.9 % IV SOLN
Freq: Once | INTRAVENOUS | Status: AC
  Filled 2020-12-13: qty 250

## 2020-12-13 MED ORDER — HEPARIN SOD (PORK) LOCK FLUSH 100 UNIT/ML IV SOLN
500.0000 [IU] | Freq: Once | INTRAVENOUS | Status: AC | PRN
  Administered 2020-12-13: 500 [IU]
  Filled 2020-12-13: qty 5

## 2020-12-13 MED ORDER — SODIUM CHLORIDE 0.9% FLUSH
10.0000 mL | Freq: Once | INTRAVENOUS | Status: AC
  Administered 2020-12-13: 10 mL via INTRAVENOUS
  Filled 2020-12-13: qty 10

## 2020-12-13 MED ORDER — HEPARIN SOD (PORK) LOCK FLUSH 100 UNIT/ML IV SOLN
500.0000 [IU] | Freq: Once | INTRAVENOUS | Status: DC
  Filled 2020-12-13: qty 5

## 2020-12-13 MED ORDER — SODIUM CHLORIDE 0.9% FLUSH
10.0000 mL | INTRAVENOUS | Status: DC | PRN
  Filled 2020-12-13: qty 10

## 2020-12-13 MED ORDER — HEPARIN SOD (PORK) LOCK FLUSH 100 UNIT/ML IV SOLN
INTRAVENOUS | Status: AC
  Filled 2020-12-13: qty 5

## 2020-12-13 NOTE — Patient Instructions (Addendum)
[place] [facility] ONCOLOGY   Discharge Instructions: Thank you for choosing [facility] to provide your oncology and hematology care.  If you have a lab appointment with the [facility], please go directly to the [facility] and check in at the registration area.  Wear comfortable clothing and clothing appropriate for easy access to any Portacath or PICC line.   We strive to give you quality time with your provider. You may need to reschedule your appointment if you arrive late (15 or more minutes).  Arriving late affects you and other patients whose appointments are after yours.  Also, if you miss three or more appointments without notifying the office, you may be dismissed from the clinic at the provider's discretion.      For prescription refill requests, have your pharmacy contact our office and allow 72 hours for refills to be completed.    Today you received the following chemotherapy and/or immunotherapy agents: Tecentriq.      To help prevent nausea and vomiting after your treatment, we encourage you to take your nausea medication as directed.  BELOW ARE SYMPTOMS THAT SHOULD BE REPORTED IMMEDIATELY: *FEVER GREATER THAN 100.4 F (38 C) OR HIGHER *CHILLS OR SWEATING *NAUSEA AND VOMITING THAT IS NOT CONTROLLED WITH YOUR NAUSEA MEDICATION *UNUSUAL SHORTNESS OF BREATH *UNUSUAL BRUISING OR BLEEDING *URINARY PROBLEMS (pain or burning when urinating, or frequent urination) *BOWEL PROBLEMS (unusual diarrhea, constipation, pain near the anus) TENDERNESS IN MOUTH AND THROAT WITH OR WITHOUT PRESENCE OF ULCERS (sore throat, sores in mouth, or a toothache) UNUSUAL RASH, SWELLING OR PAIN  UNUSUAL VAGINAL DISCHARGE OR ITCHING   Items with * indicate a potential emergency and should be followed up as soon as possible or go to the Emergency Department if any problems should occur.  Please show the CHEMOTHERAPY ALERT CARD or IMMUNOTHERAPY ALERT CARD at  check-in to the Emergency Department and triage nurse.  Should you have questions after your visit or need to cancel or reschedule your appointment, please contact [facility]  [phone] and follow the prompts.  Office hours are 8:00 a.m. to 4:30 p.m. Monday - Friday. Please note that voicemails left after 4:00 p.m. may not be returned until the following business day.  We are closed weekends and major holidays. You have access to a nurse at all times for urgent questions. Please call the main number to the clinic [phone] and follow the prompts.  For any non-urgent questions, you may also contact your provider using MyChart. We now offer e-Visits for anyone 11 and older to request care online for non-urgent symptoms. For details visit [email or web address].   Also download the MyChart app! Go to the app store, search "MyChart", open the app, select [facility], and log in with your MyChart username and password.  Due to Covid, a mask is required upon entering the hospital/clinic. If you do not have a mask, one will be given to you upon arrival. For doctor visits, patients may have 1 support person aged 93 or older with them. For treatment visits, patients cannot have anyone with them due to current Covid guidelines and our immunocompromised population.

## 2020-12-13 NOTE — Progress Notes (Signed)
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  She has been referred to [place] for findings of bone metastases and her recent MRI.  She has a prior history of 3 packs/day day smoking for over 45 years and quit smoking 5 years ago.She had a CT chest abdomen pelvis in 2018 which showed a 5 mm lung nodule in the left lower lobe.  Recently over the last 2 months patient has been having worsening back pain and was referred to orthopedics.  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  Pathologic fracture of S1.   PET scan showed 2 RUL lung nodules, hilar and mediastinal adenopathy and widespread bone mets. MRI brain negative.   Patient completed palliative RT to her spine. Bronchoscopy showed small cell lung cancer.  Palliative carboplatin, etoposide and Tecentriq started on 08/18/2018. Scans after 4 cycles showed stable disease. She is on maintenance tecentriq Patient has autoimmune hypothyroidism for which she is on levothyroxine but is not compliant with her medication    Interval history- States that she has been compliant with her  thyroid medication and she takes it consistently every day.  Denies any new aches and pains anywhere.  ECOG PS- 1 Pain scale- 0   Review of systems- Review of Systems  Constitutional:  Negative for chills, fever, malaise/fatigue and weight loss.  HENT:  Negative for congestion, ear discharge and nosebleeds.   Eyes:  Negative for blurred vision.  Respiratory:  Negative for cough, hemoptysis, sputum production, shortness of breath and wheezing.   Cardiovascular:  Negative for chest pain, palpitations, orthopnea and claudication.  Gastrointestinal:  Negative for abdominal pain, blood in stool, constipation, diarrhea, heartburn, melena, nausea and vomiting.  Genitourinary:  Negative for dysuria, flank pain, frequency, hematuria and urgency.  Musculoskeletal:  Negative for back pain, joint pain and myalgias.  Skin:  Negative for rash.  Neurological:  Negative for dizziness, tingling, focal weakness, seizures, weakness and headaches.  Endo/Heme/Allergies:  Does not bruise/bleed easily.  Psychiatric/Behavioral:  Negative for depression and suicidal ideas. The patient does not have insomnia.       Allergies  Allergen Reactions   Bee Venom Swelling     Past Medical History:  Diagnosis Date   Arthritis    Cirrhosis ([place])    Depression    GERD (gastroesophageal reflux disease)    Hyperlipidemia    Hypertension    Lung cancer ([place])    Metastatic bone cancer [facility])      Past Surgical History:  Procedure Laterality Date   APPENDECTOMY  1971   CHOLECYSTECTOMY  1971   COLONOSCOPY WITH PROPOFOL N/A 11/15/2016   Procedure: COLONOSCOPY WITH PROPOFOL;  Surgeon: Jonathon Bellows, MD;  Location: [facility] ENDOSCOPY;  Service:  Gastroenterology;  Laterality: N/A;   ENDOBRONCHIAL ULTRASOUND Right 07/30/2018   Procedure: ENDOBRONCHIAL ULTRASOUND;  Surgeon: Laverle Hobby, MD;  Location: [facility] ORS;  Service: Pulmonary;  Laterality: Right;   ESOPHAGOGASTRODUODENOSCOPY (EGD) WITH PROPOFOL N/A 01/07/2017    Procedure: ESOPHAGOGASTRODUODENOSCOPY (EGD) WITH PROPOFOL;  Surgeon: Jonathon Bellows, MD;  Location: [facility] ENDOSCOPY;  Service: Gastroenterology;  Laterality: N/A;   LAPAROSCOPY N/A 03/01/2017   Procedure: LAPAROSCOPY DIAGNOSTIC;  Surgeon: Robert Bellow, MD;  Location: [facility] ORS;  Service: General;  Laterality: N/A;   PORTA CATH INSERTION N/A 08/14/2018   Procedure: PORTA CATH INSERTION;  Surgeon: Algernon Huxley, MD;  Location: [place] CV LAB;  Service: Cardiovascular;  Laterality: N/A;   TONSILECTOMY, ADENOIDECTOMY, BILATERAL MYRINGOTOMY AND TUBES  1955   TONSILLECTOMY     VENTRAL HERNIA REPAIR N/A 03/01/2017   10 x 14 CM Ventralight ST mesh, intraperitoneal location.    VENTRAL HERNIA REPAIR N/A 03/01/2017   Procedure: HERNIA REPAIR VENTRAL ADULT;  Surgeon: Robert Bellow, MD;  Location: [facility] ORS;  Service: General;  Laterality: N/A;    Social History   Socioeconomic History   Marital status: Married    Spouse name: Johnny   Number of children: Not on file   Years of education: Not on file   Highest education level: Not on file  Occupational History   Not on file  Tobacco Use   Smoking status: Former    Packs/day: 2.00    Years: 50.00    Pack years: 100.00    Types: Cigarettes, E-cigarettes    Quit date: 02/07/2012    Years since quitting: 8.8   Smokeless tobacco: Never  Vaping Use   Vaping Use: Some days   Start date: 10/06/2013   Devices: uses no liquid  Substance and Sexual Activity   Alcohol use: No    Alcohol/week: 0.0 standard drinks   Drug use: No   Sexual activity: Yes  Other Topics Concern   Not on file  Social History Narrative   Married   Retired   Clinical cytogeneticist level of education    No children    1 cup of coffee   Social Determinants of Radio broadcast assistant Strain: Low Risk    Difficulty of Paying Living Expenses: Not hard at all  Food Insecurity: No Food Insecurity   Worried About Charity fundraiser in the Last Year: Never true    Arboriculturist in the Last Year: Never true  Transportation Needs: No Transportation Needs   Lack of Transportation (Medical): No   Lack of Transportation (Non-Medical): No  Physical Activity: Not on file  Stress: No Stress Concern Present   Feeling of Stress : Not at all  Social Connections: Unknown   Frequency of Communication with Friends and Family: More than three times a week   Frequency of Social Gatherings with Friends and Family: Not on file   Attends Religious Services: Not on file   Active Member of Clubs or Organizations: Not on file   Attends Archivist Meetings: Not on file   Marital Status: Married  Human resources officer Violence: Not At Risk   Fear of Current or Ex-Partner: No   Emotionally Abused: No   Physically Abused: No   Sexually Abused: No    Family History  Problem Relation Age of Onset   Hypertension Mother    Ovarian cancer Mother 31   Heart disease Father    Stroke Father    Ovarian cancer Sister        ?  dx cancer had hyst.   Breast cancer Neg Hx      Current Outpatient Medications:    amLODipine (NORVASC) 5 MG tablet, Take 1 tablet (5 mg total) by mouth daily., Disp: 90 tablet, Rfl: 3   buPROPion (WELLBUTRIN XL) 150 MG 24 hr tablet, Take 1 tablet (150 mg total) by mouth daily., Disp: 90 tablet, Rfl: 1   clobetasol cream (TEMOVATE) 1.24 %, Apply 1 application topically 2 (two) times daily. Lower back, Disp: 60 g, Rfl: 1   erythromycin ophthalmic ointment, Place 1 application into the right eye daily as needed., Disp: , Rfl:    escitalopram (LEXAPRO) 20 MG tablet, Take 1 tablet (20 mg total) by mouth daily., Disp: 90 tablet, Rfl: 1   folic acid (FOLVITE) 1 MG tablet, Take 1 tablet by mouth once daily, Disp: 90 tablet, Rfl: 0   ibuprofen (ADVIL,MOTRIN) 200 MG tablet, Take 400 mg by mouth every 8 (eight) hours as needed for headache or moderate pain. , Disp: , Rfl:    levothyroxine (SYNTHROID) 100 MCG tablet, Take 1 tablet (100 mcg total) by  mouth daily before breakfast., Disp: 30 tablet, Rfl: 1   Multiple Vitamins-Minerals (PRESERVISION AREDS 2) CAPS, Take 1 capsule by mouth 2 (two) times a day. , Disp: , Rfl:    rosuvastatin (CRESTOR) 20 MG tablet, Take 1 tablet by mouth once daily, Disp: 90 tablet, Rfl: 0   BINAXNOW COVID-19 AG HOME TEST KIT, Use as Directed on the Package (Patient not taking: No sig reported), Disp: , Rfl:    fluticasone (FLONASE) 50 MCG/ACT nasal spray, Place 2 sprays into both nostrils daily. (Patient not taking: Reported on 12/13/2020), Disp: 16 g, Rfl: 6   lidocaine-prilocaine (EMLA) cream, Apply to affected area once (Patient not taking: Reported on 12/13/2020), Disp: 30 g, Rfl: 3   loperamide (IMODIUM) 2 MG capsule, Take 1 capsule (2 mg total) by mouth every 2 (two) hours as needed for diarrhea or loose stools. Do not exceed 75m daily. (Patient not taking: Reported on 12/13/2020), Disp: 30 capsule, Rfl: 1   ondansetron (ZOFRAN ODT) 4 MG disintegrating tablet, Take 1 tablet (4 mg total) by mouth every 8 (eight) hours as needed for nausea or vomiting. (Patient not taking: No sig reported), Disp: 20 tablet, Rfl: 0   pantoprazole (PROTONIX) 40 MG tablet, TAKE 1 TABLET BY MOUTH TWICE DAILY BEFORE A MEAL (Patient not taking: No sig reported), Disp: 180 tablet, Rfl: 0   polyethylene glycol (MIRALAX / GLYCOLAX) 17 g packet, Take 17 g by mouth daily as needed for mild constipation. (Patient not taking: No sig reported), Disp: 14 each, Rfl: 0   valACYclovir (VALTREX) 1000 MG tablet, Take 1 tablet (1,000 mg total) by mouth 3 (three) times daily. With food x 7-10 days (Patient not taking: No sig reported), Disp: 30 tablet, Rfl: 0 No current facility-administered medications for this visit.  Facility-Administered Medications Ordered in Other Visits:    denosumab (XGEVA) injection 120 mg, 120 mg, Subcutaneous, Q30 days, RSindy Guadeloupe MD, 120 mg at 03/05/19 1525  Physical exam:  Vitals:   12/13/20 1407  BP: 116/74  Pulse:  77  Resp: 16  Temp: 98.2 F (36.8 C)  TempSrc: Oral  Weight: 180 lb (81.6 kg)  Height: _0  (1.676 m)   Physical Exam Constitutional:      General: She is not in acute distress. Cardiovascular:     Rate and Rhythm: Normal rate and regular rhythm.     Heart sounds: Normal heart  sounds.  Pulmonary:     Effort: Pulmonary effort is normal.     Breath sounds: Normal breath sounds.  Abdominal:     General: Bowel sounds are normal.     Palpations: Abdomen is soft.  Skin:    General: Skin is warm and dry.  Neurological:     Mental Status: She is alert and oriented to person, place, and time.     CMP Latest Ref Rng & Units 11/22/2020  Glucose 70 - 99 mg/dL 97  BUN 8 - 23 mg/dL 21  Creatinine 0.44 - 1.00 mg/dL 0.77  Sodium 135 - 145 mmol/L 139  Potassium 3.5 - 5.1 mmol/L 4.0  Chloride 98 - 111 mmol/L 102  CO2 22 - 32 mmol/L 31  Calcium 8.9 - 10.3 mg/dL 8.8(L)  Total Protein 6.5 - 8.1 g/dL 7.5  Total Bilirubin 0.3 - 1.2 mg/dL 0.5  Alkaline Phos 38 - 126 U/L 148(H)  AST 15 - 41 U/L 22  ALT 0 - 44 U/L 14   CBC Latest Ref Rng & Units 12/13/2020  WBC 4.0 - 10.5 K/uL 5.1  Hemoglobin 12.0 - 15.0 g/dL 12.2  Hematocrit 36.0 - 46.0 % 39.1  Platelets 150 - 400 K/uL 159    No images are attached to the encounter.  NM Bone Scan Whole Body  Result Date: 11/16/2020 CLINICAL DATA:  Small cell lung cancer. Osseous metastatic disease. Follow-up examination EXAM: NUCLEAR MEDICINE WHOLE BODY BONE SCAN TECHNIQUE: Whole body anterior and posterior images were obtained approximately 3 hours after intravenous injection of radiopharmaceutical. RADIOPHARMACEUTICALS:  22.0 mCi Technetium-63mMDP IV COMPARISON:  08/01/2020 FINDINGS: Scattered foci of uptake within the spine, left fifth rib and right sixth ribs anterolaterally, left humerus are again noted with interval improvement in uptake within the left humeral lesion. There is, however, a new focus of uptake within the right calvarium likely  corresponding to the new osseous metastasis within the right mandibular head seen on MRI examination of 08/09/2020. Additional uptake within the maxilla and mandible likely relates to periodontal disease. Normal soft tissue distribution. Normal uptake and excretion within the kidneys and bladder. IMPRESSION: Mixed response to therapy. Interval improvement in uptake within the left humerus but development of a new focus of osseous metastasis within the expected location of the right mandibular head. Stable uptake scattered within the thoracolumbar spine and ribs bilaterally. Electronically Signed   By: AFidela SalisburyM.D.   On: 11/16/2020 02:45   CT CHEST ABDOMEN PELVIS W CONTRAST  Result Date: 11/15/2020 CLINICAL DATA:  Metastatic right upper lobe small-cell lung cancer restaging EXAM: CT CHEST, ABDOMEN, AND PELVIS WITH CONTRAST TECHNIQUE: Multidetector CT imaging of the chest, abdomen and pelvis was performed following the standard protocol during bolus administration of intravenous contrast. CONTRAST:  725mOMNIPAQUE IOHEXOL 350 MG/ML SOLN, additional oral enteric contrast COMPARISON:  08/01/2020 FINDINGS: CT CHEST FINDINGS Cardiovascular: Aortic atherosclerosis. Aortic valve calcifications. Normal heart size. Three-vessel coronary artery calcifications. No pericardial effusion. Mediastinum/Nodes: No enlarged mediastinal, hilar, or axillary lymph nodes. Thyroid gland, trachea, and esophagus demonstrate no significant findings. Lungs/Pleura: Diffuse bilateral bronchial wall thickening. Mild centrilobular emphysema. Stable 3 mm nodule of the posterior right upper lobe (series 3, image 38). Background of numerous very tiny centrilobular pulmonary nodules, most concentrated in the lung apices. No pleural effusion or pneumothorax. Musculoskeletal: No chest wall mass. CT ABDOMEN PELVIS FINDINGS Hepatobiliary: No focal liver abnormality is seen. Coarse, nodular contour of the liver. Status post cholecystectomy. No  biliary dilatation. Pancreas: Unremarkable. No pancreatic  ductal dilatation or surrounding inflammatory changes. Spleen: Normal in size without significant abnormality. Adrenals/Urinary Tract: Adrenal glands are unremarkable. Kidneys are normal, without renal calculi, solid lesion, or hydronephrosis. Bladder is unremarkable. Stomach/Bowel: Stomach is within normal limits. Appendix appears normal. No evidence of bowel wall thickening, distention, or inflammatory changes. Vascular/Lymphatic: Aortic atherosclerosis. No enlarged abdominal or pelvic lymph nodes. Reproductive: No mass or other abnormality. Other: No abdominal wall hernia or abnormality. No abdominopelvic ascites. Musculoskeletal: Diffusely sclerotic osseous metastatic disease. Unchanged sclerotic endplate deformities of T2 (series 6, image 105). Unchanged high-grade endplate deformities of L2. Unchanged superior endplate deformity of S1. IMPRESSION: 1. Primary lung malignancy is not identified on this examination. 2. Stable 3 mm nodule of the posterior right upper lobe. 3. Diffusely sclerotic osseous metastatic disease, unchanged, with multiple vertebral endplate deformities as described above. 4. No evidence of new metastatic disease in the chest, abdomen, or pelvis. 5. Emphysema. 6. Coronary artery disease. Aortic Atherosclerosis (ICD10-I70.0) and Emphysema (ICD10-J43.9). Electronically Signed   By: Delanna Ahmadi M.D.   On: 11/15/2020 11:50     Assessment and plan- Patient is a 70 y.o. female With extensive stage small cell lung cancer and bone metastases here for on treatment assessment prior to cycle 37 of maintenance Tecentriq  Counts okay to proceed with cycle 37 of maintenance Tecentriq today and I will see her back in 3 weeks for cycle 38.  Plan is to continue Tecentriq until progression or toxicity.  Her recent CT scan and bone scan in October 2022 overall showed stable areas of bone metastases but there was a new focus of uptake noted on  the right mandibular head.  I am inclined to monitor this without changing treatment just yet.  Autoimmune hypothyroidism: Her levothyroxine dose has been reduced to 100 mcg.  Repeat TSH in 3 weeks and decide if it needs to be lowered any further.  I have asked her to bring her pillbox with her.  She was referred to endocrinology in the past but subsequently did not follow-up    Visit Diagnosis 1. Encounter for antineoplastic immunotherapy   2. Small cell lung cancer, right upper lobe ([place])   3. Autoimmune hypothyroidism      Dr. Randa Evens, MD, MPH [facility] at [facility] [phone] 12/13/2020 2:03 PM

## 2020-12-21 ENCOUNTER — Ambulatory Visit: Payer: PPO | Admitting: Neurology

## 2020-12-26 ENCOUNTER — Ambulatory Visit: Payer: PPO | Admitting: Family Medicine

## 2020-12-27 DIAGNOSIS — C3490 Malignant neoplasm of unspecified part of unspecified bronchus or lung: Secondary | ICD-10-CM | POA: Diagnosis not present

## 2020-12-27 DIAGNOSIS — C7951 Secondary malignant neoplasm of bone: Secondary | ICD-10-CM | POA: Diagnosis not present

## 2020-12-27 DIAGNOSIS — R413 Other amnesia: Secondary | ICD-10-CM | POA: Diagnosis not present

## 2020-12-27 DIAGNOSIS — J449 Chronic obstructive pulmonary disease, unspecified: Secondary | ICD-10-CM | POA: Diagnosis not present

## 2020-12-27 DIAGNOSIS — Z515 Encounter for palliative care: Secondary | ICD-10-CM | POA: Diagnosis not present

## 2020-12-27 DIAGNOSIS — Z87891 Personal history of nicotine dependence: Secondary | ICD-10-CM | POA: Diagnosis not present

## 2020-12-27 DIAGNOSIS — H353290 Exudative age-related macular degeneration, unspecified eye, stage unspecified: Secondary | ICD-10-CM | POA: Diagnosis not present

## 2020-12-27 DIAGNOSIS — I10 Essential (primary) hypertension: Secondary | ICD-10-CM | POA: Diagnosis not present

## 2020-12-27 DIAGNOSIS — F331 Major depressive disorder, recurrent, moderate: Secondary | ICD-10-CM | POA: Diagnosis not present

## 2021-01-03 ENCOUNTER — Inpatient Hospital Stay: Payer: PPO

## 2021-01-03 ENCOUNTER — Encounter: Payer: Self-pay | Admitting: Oncology

## 2021-01-03 ENCOUNTER — Inpatient Hospital Stay (HOSPITAL_BASED_OUTPATIENT_CLINIC_OR_DEPARTMENT_OTHER): Payer: PPO | Admitting: Oncology

## 2021-01-03 ENCOUNTER — Other Ambulatory Visit: Payer: Self-pay

## 2021-01-03 VITALS — BP 144/80 | HR 75 | Temp 97.5°F | Resp 16 | Wt 180.3 lb

## 2021-01-03 DIAGNOSIS — Z5112 Encounter for antineoplastic immunotherapy: Secondary | ICD-10-CM | POA: Diagnosis not present

## 2021-01-03 DIAGNOSIS — C3411 Malignant neoplasm of upper lobe, right bronchus or lung: Secondary | ICD-10-CM

## 2021-01-03 DIAGNOSIS — E063 Autoimmune thyroiditis: Secondary | ICD-10-CM | POA: Diagnosis not present

## 2021-01-03 LAB — CBC WITH DIFFERENTIAL/PLATELET
Abs Immature Granulocytes: 0.01 10*3/uL (ref 0.00–0.07)
Basophils Absolute: 0 10*3/uL (ref 0.0–0.1)
Basophils Relative: 1 %
Eosinophils Absolute: 0 10*3/uL (ref 0.0–0.5)
Eosinophils Relative: 1 %
HCT: 40.3 % (ref 36.0–46.0)
Hemoglobin: 12.3 g/dL (ref 12.0–15.0)
Immature Granulocytes: 0 %
Lymphocytes Relative: 23 %
Lymphs Abs: 1 10*3/uL (ref 0.7–4.0)
MCH: 26.5 pg (ref 26.0–34.0)
MCHC: 30.5 g/dL (ref 30.0–36.0)
MCV: 86.7 fL (ref 80.0–100.0)
Monocytes Absolute: 0.3 10*3/uL (ref 0.1–1.0)
Monocytes Relative: 6 %
Neutro Abs: 3 10*3/uL (ref 1.7–7.7)
Neutrophils Relative %: 69 %
Platelets: 180 10*3/uL (ref 150–400)
RBC: 4.65 MIL/uL (ref 3.87–5.11)
RDW: 16.4 % — ABNORMAL HIGH (ref 11.5–15.5)
WBC: 4.4 10*3/uL (ref 4.0–10.5)
nRBC: 0 % (ref 0.0–0.2)

## 2021-01-03 LAB — COMPREHENSIVE METABOLIC PANEL
ALT: 16 U/L (ref 0–44)
AST: 23 U/L (ref 15–41)
Albumin: 3.8 g/dL (ref 3.5–5.0)
Alkaline Phosphatase: 119 U/L (ref 38–126)
Anion gap: 8 (ref 5–15)
BUN: 18 mg/dL (ref 8–23)
CO2: 29 mmol/L (ref 22–32)
Calcium: 9.2 mg/dL (ref 8.9–10.3)
Chloride: 101 mmol/L (ref 98–111)
Creatinine, Ser: 0.89 mg/dL (ref 0.44–1.00)
GFR, Estimated: >60 mL/min (ref >60)
Glucose, Bld: 91 mg/dL (ref 70–99)
Potassium: 4 mmol/L (ref 3.5–5.1)
Sodium: 138 mmol/L (ref 135–145)
Total Bilirubin: 0.5 mg/dL (ref 0.3–1.2)
Total Protein: 7.2 g/dL (ref 6.5–8.1)

## 2021-01-03 LAB — TSH: TSH: 1.167 u[IU]/mL (ref 0.350–4.500)

## 2021-01-03 MED ORDER — HEPARIN SOD (PORK) LOCK FLUSH 100 UNIT/ML IV SOLN
500.0000 [IU] | Freq: Once | INTRAVENOUS | Status: AC | PRN
  Administered 2021-01-03: 500 [IU]
  Filled 2021-01-03: qty 5

## 2021-01-03 MED ORDER — SODIUM CHLORIDE 0.9 % IV SOLN
1200.0000 mg | Freq: Once | INTRAVENOUS | Status: AC
  Administered 2021-01-03: 1200 mg via INTRAVENOUS
  Filled 2021-01-03: qty 20

## 2021-01-03 MED ORDER — SODIUM CHLORIDE 0.9 % IV SOLN
Freq: Once | INTRAVENOUS | Status: AC
  Filled 2021-01-03: qty 250

## 2021-01-03 MED ORDER — SODIUM CHLORIDE 0.9% FLUSH
10.0000 mL | Freq: Once | INTRAVENOUS | Status: AC
  Administered 2021-01-03: 10 mL via INTRAVENOUS
  Filled 2021-01-03: qty 10

## 2021-01-03 NOTE — Patient Instructions (Signed)
[facility]  Discharge Instructions: Thank you for choosing [facility] to provide your oncology and hematology care.  If you have a lab appointment with the Cancer Center, please go directly to the Cancer Center and check in at the registration area.  Wear comfortable clothing and clothing appropriate for easy access to any Portacath or PICC line.   We strive to give you quality time with your provider. You may need to reschedule your appointment if you arrive late (15 or more minutes).  Arriving late affects you and other patients whose appointments are after yours.  Also, if you miss three or more appointments without notifying the office, you may be dismissed from the clinic at the provider's discretion.      For prescription refill requests, have your pharmacy contact our office and allow 72 hours for refills to be completed.      To help prevent nausea and vomiting after your treatment, we encourage you to take your nausea medication as directed.  BELOW ARE SYMPTOMS THAT SHOULD BE REPORTED IMMEDIATELY: *FEVER GREATER THAN 100.4 F (38 C) OR HIGHER *CHILLS OR SWEATING *NAUSEA AND VOMITING THAT IS NOT CONTROLLED WITH YOUR NAUSEA MEDICATION *UNUSUAL SHORTNESS OF BREATH *UNUSUAL BRUISING OR BLEEDING *URINARY PROBLEMS (pain or burning when urinating, or frequent urination) *BOWEL PROBLEMS (unusual diarrhea, constipation, pain near the anus) TENDERNESS IN MOUTH AND THROAT WITH OR WITHOUT PRESENCE OF ULCERS (sore throat, sores in mouth, or a toothache) UNUSUAL RASH, SWELLING OR PAIN  UNUSUAL VAGINAL DISCHARGE OR ITCHING   Items with * indicate a potential emergency and should be followed up as soon as possible or go to the Emergency Department if any problems should occur.  Please show the CHEMOTHERAPY ALERT CARD or IMMUNOTHERAPY ALERT CARD at check-in to the Emergency Department and triage nurse.  Should you have questions after your  visit or need to cancel or reschedule your appointment, please contact [facility]  [phone] and follow the prompts.  Office hours are 8:00 a.m. to 4:30 p.m. Monday - Friday. Please note that voicemails left after 4:00 p.m. may not be returned until the following business day.  We are closed weekends and major holidays. You have access to a nurse at all times for urgent questions. Please call the main number to the clinic [phone] and follow the prompts.  For any non-urgent questions, you may also contact your provider using MyChart. We now offer e-Visits for anyone 18 and older to request care online for non-urgent symptoms. For details visit [email or web address].   Also download the MyChart app! Go to the app store, search "MyChart", open the app, select [facility], and log in with your MyChart username and password.  Due to Covid, a mask is required upon entering the hospital/clinic. If you do not have a mask, one will be given to you upon arrival. For doctor visits, patients may have 1 support person aged 18 or older with them. For treatment visits, patients cannot have anyone with them due to current Covid guidelines and our immunocompromised population.  

## 2021-01-03 NOTE — Progress Notes (Signed)
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  She has been referred to [place] for findings of bone metastases and her recent MRI.  She has a prior history of 3 packs/day day smoking for over 45 years and quit smoking 5 years ago.She had a CT chest abdomen pelvis in 2018 which showed a 5 mm lung nodule in the left lower lobe.  Recently over the last 2 months patient has been having worsening back pain and was referred to orthopedics.  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  Pathologic fracture of S1.   PET scan showed 2 RUL lung nodules, hilar and mediastinal adenopathy and widespread bone mets. MRI brain negative.   Patient completed palliative RT to her spine. Bronchoscopy showed small cell lung cancer.  Palliative carboplatin, etoposide and Tecentriq started on 08/18/2018. Scans after 4 cycles showed stable disease. She is on maintenance tecentriq Patient has autoimmune hypothyroidism for which she is on levothyroxine but is not compliant with her medication    Interval history-patient is doing well and denies any specific  complaints at this time.  She reports being compliant with her levothyroxine  ECOG PS- 1 Pain scale- 0  Review of systems- Review of Systems  Constitutional:  Negative for chills, fever, malaise/fatigue and weight loss.  HENT:  Negative for congestion, ear discharge and nosebleeds.   Eyes:  Negative for blurred vision.  Respiratory:  Negative for cough, hemoptysis, sputum production, shortness of breath and wheezing.   Cardiovascular:  Negative for chest pain, palpitations, orthopnea and claudication.  Gastrointestinal:  Negative for abdominal pain, blood in stool, constipation, diarrhea, heartburn, melena, nausea and vomiting.  Genitourinary:  Negative for dysuria, flank pain, frequency, hematuria and urgency.  Musculoskeletal:  Negative for back pain, joint pain and myalgias.  Skin:  Negative for rash.  Neurological:  Negative for dizziness, tingling, focal weakness, seizures, weakness and headaches.  Endo/Heme/Allergies:  Does not bruise/bleed easily.  Psychiatric/Behavioral:  Negative for depression and suicidal ideas. The patient does not have insomnia.      Allergies  Allergen Reactions   Bee Venom Swelling     Past Medical History:  Diagnosis Date   Arthritis    Cirrhosis ([place])    Depression    GERD (gastroesophageal reflux disease)    Hyperlipidemia    Hypertension    Lung cancer ([place])    Metastatic bone cancer ([place])      Past Surgical History:  Procedure Laterality Date   APPENDECTOMY  1971   CHOLECYSTECTOMY  1971   COLONOSCOPY WITH PROPOFOL N/A 11/15/2016   Procedure: COLONOSCOPY WITH PROPOFOL;  Surgeon: Jonathon Bellows, MD;  Location: [facility] ENDOSCOPY;  Service: Gastroenterology;  Laterality: N/A;   ENDOBRONCHIAL ULTRASOUND Right  07/30/2018   Procedure: ENDOBRONCHIAL ULTRASOUND;  Surgeon: Laverle Hobby, MD;  Location: [facility] ORS;  Service: Pulmonary;  Laterality: Right;   ESOPHAGOGASTRODUODENOSCOPY (EGD) WITH PROPOFOL N/A 01/07/2017   Procedure:  ESOPHAGOGASTRODUODENOSCOPY (EGD) WITH PROPOFOL;  Surgeon: Jonathon Bellows, MD;  Location: [facility] ENDOSCOPY;  Service: Gastroenterology;  Laterality: N/A;   LAPAROSCOPY N/A 03/01/2017   Procedure: LAPAROSCOPY DIAGNOSTIC;  Surgeon: Robert Bellow, MD;  Location: [facility] ORS;  Service: General;  Laterality: N/A;   PORTA CATH INSERTION N/A 08/14/2018   Procedure: PORTA CATH INSERTION;  Surgeon: Algernon Huxley, MD;  Location: [place] [facility];  Service: Cardiovascular;  Laterality: N/A;   TONSILECTOMY, ADENOIDECTOMY, BILATERAL MYRINGOTOMY AND TUBES  1955   TONSILLECTOMY     VENTRAL HERNIA REPAIR N/A 03/01/2017   10 x 14 CM Ventralight ST mesh, intraperitoneal location.    VENTRAL HERNIA REPAIR N/A 03/01/2017   Procedure: HERNIA REPAIR VENTRAL ADULT;  Surgeon: Robert Bellow, MD;  Location: [facility] ORS;  Service: General;  Laterality: N/A;    Social History   Socioeconomic History   Marital status: Married    Spouse name: Johnny   Number of children: Not on file   Years of education: Not on file   Highest education level: Not on file  Occupational History   Not on file  Tobacco Use   Smoking status: Former    Packs/day: 2.00    Years: 50.00    Pack years: 100.00    Types: Cigarettes, E-cigarettes    Quit date: 02/07/2012    Years since quitting: 8.9   Smokeless tobacco: Never  Vaping Use   Vaping Use: Some days   Start date: 10/06/2013   Devices: uses no liquid  Substance and Sexual Activity   Alcohol use: No    Alcohol/week: 0.0 standard drinks   Drug use: No   Sexual activity: Yes  Other Topics Concern   Not on file  Social History Narrative   Married   Retired   Clinical cytogeneticist level of education    No children    1 cup of coffee   Social Determinants of Radio broadcast assistant Strain: Low Risk    Difficulty of Paying Living Expenses: Not hard at all  Food Insecurity: No Food Insecurity   Worried About Charity fundraiser in the Last Year: Never true   Academic librarian in the Last Year: Never true  Transportation Needs: No Transportation Needs   Lack of Transportation (Medical): No   Lack of Transportation (Non-Medical): No  Physical Activity: Not on file  Stress: No Stress Concern Present   Feeling of Stress : Not at all  Social Connections: Unknown   Frequency of Communication with Friends and Family: More than three times a week   Frequency of Social Gatherings with Friends and Family: Not on file   Attends Religious Services: Not on Electrical engineer or Organizations: Not on file   Attends Archivist Meetings: Not on file   Marital Status: Married  Human resources officer Violence: Not At Risk   Fear of Current or Ex-Partner: No   Emotionally Abused: No   Physically Abused: No   Sexually Abused: No    Family History  Problem Relation Age of Onset   Hypertension Mother    Ovarian cancer Mother 69   Heart disease Father    Stroke Father    Ovarian cancer Sister        ? dx  cancer had hyst.   Breast cancer Neg Hx      Current Outpatient Medications:    amLODipine (NORVASC) 5 MG tablet, Take 1 tablet (5 mg total) by mouth daily., Disp: 90 tablet, Rfl: 3   buPROPion (WELLBUTRIN XL) 150 MG 24 hr tablet, Take 1 tablet (150 mg total) by mouth daily., Disp: 90 tablet, Rfl: 1   clobetasol cream (TEMOVATE) 1.91 %, Apply 1 application topically 2 (two) times daily. Lower back, Disp: 60 g, Rfl: 1   escitalopram (LEXAPRO) 20 MG tablet, Take 1 tablet (20 mg total) by mouth daily., Disp: 90 tablet, Rfl: 1   folic acid (FOLVITE) 1 MG tablet, Take 1 tablet by mouth once daily, Disp: 90 tablet, Rfl: 0   ibuprofen (ADVIL,MOTRIN) 200 MG tablet, Take 400 mg by mouth every 8 (eight) hours as needed for headache or moderate pain. , Disp: , Rfl:    levothyroxine (SYNTHROID) 100 MCG tablet, Take 1 tablet (100 mcg total) by mouth daily before breakfast., Disp: 30 tablet, Rfl: 1   loperamide (IMODIUM) 2 MG capsule, Take 1 capsule (2 mg total)  by mouth every 2 (two) hours as needed for diarrhea or loose stools. Do not exceed 19m daily., Disp: 30 capsule, Rfl: 1   Multiple Vitamins-Minerals (PRESERVISION AREDS 2) CAPS, Take 1 capsule by mouth 2 (two) times a day. , Disp: , Rfl:    rosuvastatin (CRESTOR) 20 MG tablet, Take 1 tablet by mouth once daily, Disp: 90 tablet, Rfl: 0   BINAXNOW COVID-19 AG HOME TEST KIT, Use as Directed on the Package (Patient not taking: Reported on 11/22/2020), Disp: , Rfl:    erythromycin ophthalmic ointment, Place 1 application into the right eye daily as needed. (Patient not taking: Reported on 01/03/2021), Disp: , Rfl:    fluticasone (FLONASE) 50 MCG/ACT nasal spray, Place 2 sprays into both nostrils daily. (Patient not taking: Reported on 12/13/2020), Disp: 16 g, Rfl: 6   lidocaine-prilocaine (EMLA) cream, Apply to affected area once (Patient not taking: Reported on 12/13/2020), Disp: 30 g, Rfl: 3   ondansetron (ZOFRAN ODT) 4 MG disintegrating tablet, Take 1 tablet (4 mg total) by mouth every 8 (eight) hours as needed for nausea or vomiting. (Patient not taking: Reported on 11/22/2020), Disp: 20 tablet, Rfl: 0   pantoprazole (PROTONIX) 40 MG tablet, TAKE 1 TABLET BY MOUTH TWICE DAILY BEFORE A MEAL (Patient not taking: Reported on 11/22/2020), Disp: 180 tablet, Rfl: 0   polyethylene glycol (MIRALAX / GLYCOLAX) 17 g packet, Take 17 g by mouth daily as needed for mild constipation. (Patient not taking: Reported on 11/22/2020), Disp: 14 each, Rfl: 0   valACYclovir (VALTREX) 1000 MG tablet, Take 1 tablet (1,000 mg total) by mouth 3 (three) times daily. With food x 7-10 days (Patient not taking: Reported on 11/22/2020), Disp: 30 tablet, Rfl: 0 No current facility-administered medications for this visit.  Facility-Administered Medications Ordered in Other Visits:    denosumab (XGEVA) injection 120 mg, 120 mg, Subcutaneous, Q30 days, RSindy Guadeloupe MD, 120 mg at 03/05/19 1525   heparin lock flush 100 unit/mL, 500  Units, Intracatheter, Once PRN, RSindy Guadeloupe MD  Physical exam:  Vitals:   01/03/21 1011  BP: (!) 144/80  Pulse: 75  Resp: 16  Temp: (!) 97.5 F (36.4 C)  SpO2: 93%  Weight: 180 lb 4.8 oz (81.8 kg)   Physical Exam Cardiovascular:     Rate and Rhythm: Normal rate and regular rhythm.     Heart sounds: Normal heart  sounds.  Pulmonary:     Effort: Pulmonary effort is normal.     Breath sounds: Normal breath sounds.  Abdominal:     General: Bowel sounds are normal.     Palpations: Abdomen is soft.  Skin:    General: Skin is warm and dry.  Neurological:     Mental Status: She is alert and oriented to person, place, and time.     CMP Latest Ref Rng & Units 01/03/2021  Glucose 70 - 99 mg/dL 91  BUN 8 - 23 mg/dL 18  Creatinine 0.44 - 1.00 mg/dL 0.89  Sodium 135 - 145 mmol/L 138  Potassium 3.5 - 5.1 mmol/L 4.0  Chloride 98 - 111 mmol/L 101  CO2 22 - 32 mmol/L 29  Calcium 8.9 - 10.3 mg/dL 9.2  Total Protein 6.5 - 8.1 g/dL 7.2  Total Bilirubin 0.3 - 1.2 mg/dL 0.5  Alkaline Phos 38 - 126 U/L 119  AST 15 - 41 U/L 23  ALT 0 - 44 U/L 16   CBC Latest Ref Rng & Units 01/03/2021  WBC 4.0 - 10.5 K/uL 4.4  Hemoglobin 12.0 - 15.0 g/dL 12.3  Hematocrit 36.0 - 46.0 % 40.3  Platelets 150 - 400 K/uL 180   Assessment and plan- Patient is a 70 y.o. female With extensive stage small cell lung cancer and bone metastases.  She is here for on treatment assessment prior to cycle 38 of maintenance Tecentriq  Counts okay to proceed with cycle 38 of maintenance Tecentriq today.  She will directly proceed for cycle 39 in 3 weeks and I will see her back in 6 weeks for cycle 40.  She would be due for repeat scans sometime in January 2023.  Autoimmune hypothyroidism: Patient's TSH has been varying widely from hypo to hyperthyroidism which likely reflects incorrect pill technique for noncompliance.  Her TSH is normal today and previously it was more suggestive of hyperthyroidism.  I am not making any  acute adjustments to her thyroid medications and we will continue to monitor at   Visit Diagnosis 1. Small cell lung cancer, right upper lobe ([place])   2. Encounter for antineoplastic immunotherapy   3. Autoimmune hypothyroidism      Dr. Randa Evens, MD, MPH [facility] [phone] 01/03/2021 12:41 PM

## 2021-01-19 ENCOUNTER — Encounter: Payer: Self-pay | Admitting: Neurology

## 2021-01-19 ENCOUNTER — Other Ambulatory Visit: Payer: Self-pay

## 2021-01-19 ENCOUNTER — Ambulatory Visit: Payer: PPO | Admitting: Neurology

## 2021-01-19 VITALS — BP 146/74 | HR 81

## 2021-01-19 DIAGNOSIS — R4189 Other symptoms and signs involving cognitive functions and awareness: Secondary | ICD-10-CM

## 2021-01-19 DIAGNOSIS — F32A Depression, unspecified: Secondary | ICD-10-CM | POA: Diagnosis not present

## 2021-01-19 DIAGNOSIS — C3490 Malignant neoplasm of unspecified part of unspecified bronchus or lung: Secondary | ICD-10-CM

## 2021-01-19 MED ORDER — RIVASTIGMINE TARTRATE 1.5 MG PO CAPS: 1.5000 mg | ORAL_CAPSULE | Freq: Two times a day (BID) | ORAL | 3 refills | Status: DC

## 2021-01-19 NOTE — Progress Notes (Signed)
[name]  PATIENT: Anne [name] DOB: 1950/09/17  REQUESTING CLINICIAN: Leone Haven, MD HISTORY FROM: Patient and husband  REASON FOR VISIT: Memory decline    HISTORICAL  CHIEF COMPLAINT:  Chief Complaint  Patient presents with   New Patient (Initial Visit)    Rm 77, with husband, c/o memory problems     HISTORY OF PRESENT ILLNESS:  This is a 70 year old woman with past medical history of lung cancer currently getting chemo every 3 to 4 weeks, with bone metastasis, depression (severe during wintertime) hypertension, hypothyroidism, and hyperlipidemia who is presenting with memory decline.  Patient stated that her memory is terrible, she forgets a lot of things.  She has issue with remote memory and also report forgetting recent conversations.  She also misplaces a lot of things.  She currently drives, denies any recent accident, denies being lost in familiar places.  She can handle her financial affairs by herself, denies being late on her bills.  She knows her family members well, do not confuse them with other people.  She has started Prevagen and reports some improvement with the medication.   Husband stated patient is more forgetful than before, and mainly misplace a lot of things.  He has to repeat himself multiple times because patient will keep on asking the same questions over and over.   She has not been wandering outside her house.  She is able to complete all activities of daily living, she is able to cook, clean, bathe and dress herself.     OTHER MEDICAL CONDITIONS: lung cancer chemo in remission but still undergoing treatment every 3 to 4 weeks, Depression (severe during the winter), HTN, Hypothyroidism, HLD   REVIEW OF SYSTEMS: Full 14 system review of systems performed and negative with exception of: as noted in the HPI   ALLERGIES: Allergies  Allergen Reactions   Bee Venom Swelling    HOME MEDICATIONS: Outpatient Medications  Prior to Visit  Medication Sig Dispense Refill   amLODipine (NORVASC) 5 MG tablet Take 1 tablet (5 mg total) by mouth daily. 90 tablet 3   BINAXNOW COVID-19 AG HOME TEST KIT      buPROPion (WELLBUTRIN XL) 150 MG 24 hr tablet Take 1 tablet (150 mg total) by mouth daily. 90 tablet 1   clobetasol cream (TEMOVATE) 3.30 % Apply 1 application topically 2 (two) times daily. Lower back 60 g 1   erythromycin ophthalmic ointment Place 1 application into the right eye daily as needed.     escitalopram (LEXAPRO) 20 MG tablet Take 1 tablet (20 mg total) by mouth daily. 90 tablet 1   fluticasone (FLONASE) 50 MCG/ACT nasal spray Place 2 sprays into both nostrils daily. 16 g 6   folic acid (FOLVITE) 1 MG tablet Take 1 tablet by mouth once daily 90 tablet 0   ibuprofen (ADVIL,MOTRIN) 200 MG tablet Take 400 mg by mouth every 8 (eight) hours as needed for headache or moderate pain.      levothyroxine (SYNTHROID) 100 MCG tablet Take 1 tablet (100 mcg total) by mouth daily before breakfast. 30 tablet 1   lidocaine-prilocaine (EMLA) cream Apply to affected area once 30 g 3   loperamide (IMODIUM) 2 MG capsule Take 1 capsule (2 mg total) by mouth every 2 (two) hours as needed for diarrhea or loose stools. Do not exceed 9m daily. 30 capsule 1   Multiple Vitamins-Minerals (PRESERVISION AREDS 2) CAPS Take 1 capsule by mouth 2 (two) times a day.  ondansetron (ZOFRAN ODT) 4 MG disintegrating tablet Take 1 tablet (4 mg total) by mouth every 8 (eight) hours as needed for nausea or vomiting. 20 tablet 0   pantoprazole (PROTONIX) 40 MG tablet TAKE 1 TABLET BY MOUTH TWICE DAILY BEFORE A MEAL 180 tablet 0   polyethylene glycol (MIRALAX / GLYCOLAX) 17 g packet Take 17 g by mouth daily as needed for mild constipation. 14 each 0   rosuvastatin (CRESTOR) 20 MG tablet Take 1 tablet by mouth once daily 90 tablet 0   valACYclovir (VALTREX) 1000 MG tablet Take 1 tablet (1,000 mg total) by mouth 3 (three) times daily. With food x 7-10  days 30 tablet 0   Facility-Administered Medications Prior to Visit  Medication Dose Route Frequency Provider Last Rate Last Admin   denosumab (XGEVA) injection 120 mg  120 mg Subcutaneous Q30 days Sindy Guadeloupe, MD   120 mg at 03/05/19 1525    PAST MEDICAL HISTORY: Past Medical History:  Diagnosis Date   Arthritis    Cirrhosis ([place])    Depression    GERD (gastroesophageal reflux disease)    Hyperlipidemia    Hypertension    Lung cancer ([place])    Metastatic bone cancer ([place])     PAST SURGICAL HISTORY: Past Surgical History:  Procedure Laterality Date   [place]   COLONOSCOPY WITH PROPOFOL N/A 11/15/2016   Procedure: COLONOSCOPY WITH PROPOFOL;  Surgeon: Jonathon Bellows, MD;  Location: [facility] ENDOSCOPY;  Service: Gastroenterology;  Laterality: N/A;   ENDOBRONCHIAL ULTRASOUND Right 07/30/2018   Procedure: ENDOBRONCHIAL ULTRASOUND;  Surgeon: Laverle Hobby, MD;  Location: [facility] ORS;  Service: Pulmonary;  Laterality: Right;   ESOPHAGOGASTRODUODENOSCOPY (EGD) WITH PROPOFOL N/A 01/07/2017   Procedure: ESOPHAGOGASTRODUODENOSCOPY (EGD) WITH PROPOFOL;  Surgeon: Jonathon Bellows, MD;  Location: [facility] ENDOSCOPY;  Service: Gastroenterology;  Laterality: N/A;   LAPAROSCOPY N/A 03/01/2017   Procedure: LAPAROSCOPY DIAGNOSTIC;  Surgeon: Robert Bellow, MD;  Location: [facility] ORS;  Service: General;  Laterality: N/A;   PORTA CATH INSERTION N/A 08/14/2018   Procedure: PORTA CATH INSERTION;  Surgeon: Algernon Huxley, MD;  Location: [facility];  Service: Cardiovascular;  Laterality: N/A;   TONSILECTOMY, ADENOIDECTOMY, BILATERAL MYRINGOTOMY AND TUBES  1955   TONSILLECTOMY     VENTRAL HERNIA REPAIR N/A 03/01/2017   10 x 14 CM Ventralight ST mesh, intraperitoneal location.    VENTRAL HERNIA REPAIR N/A 03/01/2017   Procedure: HERNIA REPAIR VENTRAL ADULT;  Surgeon: Robert Bellow, MD;  Location: [facility] ORS;  Service: General;  Laterality: N/A;    FAMILY  HISTORY: Family History  Problem Relation Age of Onset   Hypertension Mother    Ovarian cancer Mother 48   Heart disease Father    Stroke Father    Ovarian cancer Sister        ? dx cancer had hyst.   Breast cancer Neg Hx     SOCIAL HISTORY: Social History   Socioeconomic History   Marital status: Married    Spouse name: Johnny   Number of children: Not on file   Years of education: Not on file   Highest education level: Not on file  Occupational History   Not on file  Tobacco Use   Smoking status: Former    Packs/day: 2.00    Years: 50.00    Pack years: 100.00    Types: Cigarettes, E-cigarettes    Quit date: 02/07/2012    Years since quitting: 8.9   Smokeless tobacco:  Never  Vaping Use   Vaping Use: Some days   Start date: 10/06/2013   Devices: uses no liquid  Substance and Sexual Activity   Alcohol use: No    Alcohol/week: 0.0 standard drinks   Drug use: No   Sexual activity: Yes  Other Topics Concern   Not on file  Social History Narrative   Married   Retired   Clinical cytogeneticist level of education    No children    1 cup of coffee   Social Determinants of Radio broadcast assistant Strain: Low Risk    Difficulty of Paying Living Expenses: Not hard at all  Food Insecurity: No Food Insecurity   Worried About Charity fundraiser in the Last Year: Never true   Arboriculturist in the Last Year: Never true  Transportation Needs: No Transportation Needs   Lack of Transportation (Medical): No   Lack of Transportation (Non-Medical): No  Physical Activity: Not on file  Stress: No Stress Concern Present   Feeling of Stress : Not at all  Social Connections: Unknown   Frequency of Communication with Friends and Family: More than three times a week   Frequency of Social Gatherings with Friends and Family: Not on file   Attends Religious Services: Not on Electrical engineer or Organizations: Not on file   Attends Archivist Meetings: Not on  file   Marital Status: Married  Human resources officer Violence: Not At Risk   Fear of Current or Ex-Partner: No   Emotionally Abused: No   Physically Abused: No   Sexually Abused: No     PHYSICAL EXAM  GENERAL EXAM/CONSTITUTIONAL: Vitals:  Vitals:   01/19/21 0831  BP: (!) 146/74  Pulse: 81   There is no height or weight on file to calculate BMI. Wt Readings from Last 3 Encounters:  01/03/21 180 lb 4.8 oz (81.8 kg)  12/13/20 180 lb (81.6 kg)  11/22/20 176 lb (79.8 kg)   Patient is in no distress; well developed, nourished and groomed; neck is supple  CARDIOVASCULAR: Examination of carotid arteries is normal; no carotid bruits Regular rate and rhythm, no murmurs Examination of peripheral vascular system by observation and palpation is normal  EYES: Pupils round and reactive to light, Visual fields full to confrontation, Extraocular movements intacts,   MUSCULOSKELETAL: Gait, strength, tone, movements noted in Neurologic exam below  NEUROLOGIC: MENTAL STATUS:  MMSE - [employer] Exam 01/19/2021 03/14/2020 06/04/2017  Orientation to time 4 4 5   Orientation to Place 5 5 5   Registration 3 3 3   Attention/ Calculation 2 5 5   Recall 0 0 0  Language- name 2 objects 2 2 2   Language- repeat 1 1 1   Language- follow 3 step command 3 2 3   Language- read & follow direction 1 1 1   Write a sentence 1 1 1   Copy design 1 1 1   Total score 23 25 27    awake, alert, oriented to person, place and time language fluent, comprehension intact, naming intact  CRANIAL NERVE:  2nd, 3rd, 4th, 6th - pupils equal and reactive to light, visual fields full to confrontation, extraocular muscles intact, no nystagmus 5th - facial sensation symmetric 7th - facial strength symmetric 8th - hearing intact 9th - palate elevates symmetrically, uvula midline 11th - shoulder shrug symmetric 12th - tongue protrusion midline  MOTOR:  normal bulk and tone, full strength in the BUE, BLE  SENSORY:   normal and  symmetric to light touch, pinprick, temperature, vibration  COORDINATION:  finger-nose-finger, fine finger movements normal  REFLEXES:  deep tendon reflexes present and symmetric  GAIT/STATION:  normal   DIAGNOSTIC DATA (LABS, IMAGING, TESTING) - I reviewed patient records, labs, notes, testing and imaging myself where available.  Lab Results  Component Value Date   WBC 4.4 01/03/2021   HGB 12.3 01/03/2021   HCT 40.3 01/03/2021   MCV 86.7 01/03/2021   PLT 180 01/03/2021      Component Value Date/Time   NA 138 01/03/2021 0950   K 4.0 01/03/2021 0950   CL 101 01/03/2021 0950   CO2 29 01/03/2021 0950   GLUCOSE 91 01/03/2021 0950   BUN 18 01/03/2021 0950   CREATININE 0.89 01/03/2021 0950   CREATININE 1.09 02/26/2012 0925   CALCIUM 9.2 01/03/2021 0950   PROT 7.2 01/03/2021 0950   ALBUMIN 3.8 01/03/2021 0950   AST 23 01/03/2021 0950   ALT 16 01/03/2021 0950   ALKPHOS 119 01/03/2021 0950   BILITOT 0.5 01/03/2021 0950   GFRNONAA >60 01/03/2021 0950   GFRNONAA 54 (L) 02/26/2012 0925   GFRAA >60 11/05/2019 2025   GFRAA >60 02/26/2012 0925   Lab Results  Component Value Date   CHOL 177 08/24/2020   HDL 54.90 08/24/2020   LDLCALC 82 08/24/2020   LDLDIRECT 95.0 07/22/2019   TRIG 198.0 (H) 08/24/2020   CHOLHDL 3 08/24/2020   Lab Results  Component Value Date   HGBA1C 6.1 09/12/2016   Lab Results  Component Value Date   VITAMINB12 1,235 (H) 10/01/2018   Lab Results  Component Value Date   TSH 1.167 01/03/2021    MRI Brain 08/2020 1. No evidence of intracranial metastasis. 2. Evidence of right mandibular head metastasis which is new from 2020 brain MRI and possibly new from a February 2022 bone scan.    ASSESSMENT AND PLAN  70 y.o. year old female with lung cancer currently getting chemo every 3 to 4 weeks, with bone metastasis, depression (severe during wintertime) hypertension, hypothyroidism, and hyperlipidemia who is presenting with memory  decline described as being forgetful, has to repeat herself multiple times and asked the same questions.  She still able to do all activities of daily living.  She currently drives, denies any accident or being lost in familiar places.  She is able to pay her bills on time and knows people who are familiar to her.  Husband reported history of depression which is worse during the winter season, she is currently on Wellbutrin and Lexapro.  She is also getting chemotherapy every 4 weeks which can cause brain fog.  She scored a 23 out of 30 on Mini-Mental status exam.  Her brain MRI does not show any acute intracranial abnormality and her TSH and B12 level are within normal limits.  I do believe patient cognitive impairment/memory problem is worsened by her current depression and also brain fog due to chemotherapy.  I wanted to start her on Aricept but it was not covered by insurance so we will start her on rivastigmine 1.5 mg twice daily.  I advised her to follow-up with her primary care doctor, I also advised them to call me or discussed with their primary care doctor if the rivastigmine is helpful, if it is helpful we can increase it to 3 mg twice daily.  I will see her in 1 year for follow-up.   1. Cognitive impairment   2. Depression, unspecified depression type   3. Malignant neoplasm of  lung, unspecified laterality, unspecified part of lung ([place])     PLAN: Start Rivastigmine 1.5 mg twice daily  Continue all other medications  Follow up in your primary care doctor  Return in 1 year   No orders of the defined types were placed in this encounter.   Meds ordered this encounter  Medications   rivastigmine (EXELON) 1.5 MG capsule    Sig: Take 1 capsule (1.5 mg total) by mouth 2 (two) times daily.    Dispense:  60 capsule    Refill:  3     Return in about 1 year (around 01/19/2022).    Alric Ran, MD 01/19/2021, 11:49 AM  [name] [address] [place], [place] [address] [phone]

## 2021-01-19 NOTE — Patient Instructions (Addendum)
Start Rivastigmine 1.5 mg twice daily  Continue all other medications  Follow up in your primary care doctor  Return in 1 year

## 2021-01-23 ENCOUNTER — Encounter: Payer: Self-pay | Admitting: Family Medicine

## 2021-01-23 ENCOUNTER — Other Ambulatory Visit: Payer: Self-pay

## 2021-01-23 ENCOUNTER — Ambulatory Visit (INDEPENDENT_AMBULATORY_CARE_PROVIDER_SITE_OTHER): Payer: PPO | Admitting: Family Medicine

## 2021-01-23 VITALS — BP 120/80 | HR 73 | Temp 98.5°F | Ht 66.0 in | Wt 182.6 lb

## 2021-01-23 DIAGNOSIS — R4189 Other symptoms and signs involving cognitive functions and awareness: Secondary | ICD-10-CM

## 2021-01-23 DIAGNOSIS — Z23 Encounter for immunization: Secondary | ICD-10-CM

## 2021-01-23 DIAGNOSIS — T148XXA Other injury of unspecified body region, initial encounter: Secondary | ICD-10-CM | POA: Diagnosis not present

## 2021-01-23 DIAGNOSIS — H353211 Exudative age-related macular degeneration, right eye, with active choroidal neovascularization: Secondary | ICD-10-CM | POA: Diagnosis not present

## 2021-01-23 DIAGNOSIS — F331 Major depressive disorder, recurrent, moderate: Secondary | ICD-10-CM

## 2021-01-23 DIAGNOSIS — S065XAA Traumatic subdural hemorrhage with loss of consciousness status unknown, initial encounter: Secondary | ICD-10-CM | POA: Insufficient documentation

## 2021-01-23 MED ORDER — BUPROPION HCL ER (XL) 300 MG PO TB24: 300.0000 mg | ORAL_TABLET | Freq: Every day | ORAL | 1 refills | Status: DC

## 2021-01-23 NOTE — Assessment & Plan Note (Signed)
Neurology felt this was a mix of memory decline, depression, and brain fog from her chemotherapy.  She will continue treatment through neurology.  We will continue to treat depression as outlined below.

## 2021-01-23 NOTE — Patient Instructions (Signed)
Nice to see you. We are increasing your Wellbutrin to 300 mg once daily.  If your depression worsens please let us know. You can take Tylenol over-the-counter for the discomfort where you hurt your leg.  If the area enlarges or becomes significantly painful particularly when you are walking or bearing weight please let us know right away.

## 2021-01-23 NOTE — Assessment & Plan Note (Signed)
Noted on her right shin after an injury to the area.  Discussed this may take weeks to fully resolve.  Discussed Tylenol for discomfort.  Advised she could use a warm compress as well.  If the area worsens or she develops significant pain particularly while ambulating she will let us know immediately.

## 2021-01-23 NOTE — Assessment & Plan Note (Signed)
Continues to have some issues though is somewhat improved.  We will increase her Wellbutrin to 300 mg once daily.  She will continue Lexapro 20 mg once daily.  We will follow-up in 3 months.

## 2021-01-23 NOTE — Progress Notes (Signed)
Tommi Rumps, MD Phone: [phone]  Anne [name] is a 70 y.o. female who presents today for f/u.  Cognitive impairment: Patient saw neurology.  They felt as though she had some cognitive impairment and also had some depression and brain fog related to chemotherapy.  They felt as though these things were contributing to her memory decline.  They started her on rivastigmine.  She is also taking Prevagen over-the-counter.    Depression: Patient notes some days are better than they had been.  She has had some benefit from the Wellbutrin.  She continues on Lexapro.  Sometimes she does forget to take her medications.  She notes no history of seizures.  Her husband notes she does not sleep well though she notes it is because he snores.  No SI.  Right leg injury: Patient notes she bumped into something 2 days ago.  She does not remember exactly when she bumped into.  She did not fall.  She has a bump in her mid right shin.  Notes it hurts with palpation or if she rolls over in bed though does not hurt to ambulate.  She has not taken any medications for this.  Social History   Tobacco Use  Smoking Status Former   Packs/day: 2.00   Years: 50.00   Pack years: 100.00   Types: Cigarettes, E-cigarettes   Quit date: 02/07/2012   Years since quitting: 8.9  Smokeless Tobacco Never    Current Outpatient Medications on File Prior to Visit  Medication Sig Dispense Refill   amLODipine (NORVASC) 5 MG tablet Take 1 tablet (5 mg total) by mouth daily. 90 tablet 3   BINAXNOW COVID-19 AG HOME TEST KIT      clobetasol cream (TEMOVATE) 8.14 % Apply 1 application topically 2 (two) times daily. Lower back 60 g 1   erythromycin ophthalmic ointment Place 1 application into the right eye daily as needed.     escitalopram (LEXAPRO) 20 MG tablet Take 1 tablet (20 mg total) by mouth daily. 90 tablet 1   fluticasone (FLONASE) 50 MCG/ACT nasal spray Place 2 sprays into both nostrils daily. 16 g 6   folic acid  (FOLVITE) 1 MG tablet Take 1 tablet by mouth once daily 90 tablet 0   ibuprofen (ADVIL,MOTRIN) 200 MG tablet Take 400 mg by mouth every 8 (eight) hours as needed for headache or moderate pain.      levothyroxine (SYNTHROID) 100 MCG tablet Take 1 tablet (100 mcg total) by mouth daily before breakfast. 30 tablet 1   lidocaine-prilocaine (EMLA) cream Apply to affected area once 30 g 3   loperamide (IMODIUM) 2 MG capsule Take 1 capsule (2 mg total) by mouth every 2 (two) hours as needed for diarrhea or loose stools. Do not exceed 16m daily. 30 capsule 1   Multiple Vitamins-Minerals (PRESERVISION AREDS 2) CAPS Take 1 capsule by mouth 2 (two) times a day.      ondansetron (ZOFRAN ODT) 4 MG disintegrating tablet Take 1 tablet (4 mg total) by mouth every 8 (eight) hours as needed for nausea or vomiting. 20 tablet 0   pantoprazole (PROTONIX) 40 MG tablet TAKE 1 TABLET BY MOUTH TWICE DAILY BEFORE A MEAL 180 tablet 0   polyethylene glycol (MIRALAX / GLYCOLAX) 17 g packet Take 17 g by mouth daily as needed for mild constipation. 14 each 0   rivastigmine (EXELON) 1.5 MG capsule Take 1 capsule (1.5 mg total) by mouth 2 (two) times daily. 60 capsule 3   rosuvastatin (CRESTOR)  20 MG tablet Take 1 tablet by mouth once daily 90 tablet 0   valACYclovir (VALTREX) 1000 MG tablet Take 1 tablet (1,000 mg total) by mouth 3 (three) times daily. With food x 7-10 days 30 tablet 0   Current Facility-Administered Medications on File Prior to Visit  Medication Dose Route Frequency Provider Last Rate Last Admin   denosumab (XGEVA) injection 120 mg  120 mg Subcutaneous Q30 days Sindy Guadeloupe, MD   120 mg at 03/05/19 1525     ROS see history of present illness  Objective  Physical Exam Vitals:   01/23/21 1015  BP: 120/80  Pulse: 73  Temp: 98.5 F (36.9 C)  SpO2: 94%    BP Readings from Last 3 Encounters:  01/23/21 120/80  01/19/21 (!) 146/74  01/03/21 (!) 144/80   Wt Readings from Last 3 Encounters:   01/23/21 182 lb 9.6 oz (82.8 kg)  01/03/21 180 lb 4.8 oz (81.8 kg)  12/13/20 180 lb (81.6 kg)    Physical Exam Constitutional:      General: She is not in acute distress.    Appearance: She is not diaphoretic.  Cardiovascular:     Rate and Rhythm: Normal rate and regular rhythm.     Heart sounds: Normal heart sounds.  Pulmonary:     Effort: Pulmonary effort is normal.     Breath sounds: Normal breath sounds.  Musculoskeletal:       Legs:  Skin:    General: Skin is warm and dry.  Neurological:     Mental Status: She is alert.     Assessment/Plan: Please see individual problem list.  Problem List Items Addressed This Visit     Cognitive impairment    Neurology felt this was a mix of memory decline, depression, and brain fog from her chemotherapy.  She will continue treatment through neurology.  We will continue to treat depression as outlined below.      Depression    Continues to have some issues though is somewhat improved.  We will increase her Wellbutrin to 300 mg once daily.  She will continue Lexapro 20 mg once daily.  We will follow-up in 3 months.      Relevant Medications   buPROPion (WELLBUTRIN XL) 300 MG 24 hr tablet   Hematoma    Noted on her right shin after an injury to the area.  Discussed this may take weeks to fully resolve.  Discussed Tylenol for discomfort.  Advised she could use a warm compress as well.  If the area worsens or she develops significant pain particularly while ambulating she will let us know immediately.      Other Visit Diagnoses     Need for immunization against influenza    -  Primary   Relevant Orders   Flu Vaccine QUAD High Dose(Fluad) (Completed)       Return in about 3 months (around 04/23/2021) for Depression/memory difficulty.  This visit occurred during the SARS-CoV-2 public health emergency.  Safety protocols were in place, including screening questions prior to the visit, additional usage of staff PPE, and extensive  cleaning of exam room while observing appropriate contact time as indicated for disinfecting solutions.    Tommi Rumps, MD [place]

## 2021-01-24 ENCOUNTER — Inpatient Hospital Stay: Payer: PPO

## 2021-01-24 ENCOUNTER — Inpatient Hospital Stay: Payer: PPO | Attending: Oncology

## 2021-01-24 VITALS — BP 120/62 | HR 78 | Temp 98.9°F | Resp 16

## 2021-01-24 DIAGNOSIS — Z5112 Encounter for antineoplastic immunotherapy: Secondary | ICD-10-CM | POA: Insufficient documentation

## 2021-01-24 DIAGNOSIS — C3411 Malignant neoplasm of upper lobe, right bronchus or lung: Secondary | ICD-10-CM | POA: Diagnosis not present

## 2021-01-24 DIAGNOSIS — R413 Other amnesia: Secondary | ICD-10-CM | POA: Diagnosis not present

## 2021-01-24 DIAGNOSIS — F331 Major depressive disorder, recurrent, moderate: Secondary | ICD-10-CM | POA: Diagnosis not present

## 2021-01-24 DIAGNOSIS — C7951 Secondary malignant neoplasm of bone: Secondary | ICD-10-CM | POA: Insufficient documentation

## 2021-01-24 DIAGNOSIS — J449 Chronic obstructive pulmonary disease, unspecified: Secondary | ICD-10-CM | POA: Diagnosis not present

## 2021-01-24 DIAGNOSIS — Z87891 Personal history of nicotine dependence: Secondary | ICD-10-CM | POA: Diagnosis not present

## 2021-01-24 DIAGNOSIS — Z79899 Other long term (current) drug therapy: Secondary | ICD-10-CM | POA: Diagnosis not present

## 2021-01-24 DIAGNOSIS — Z515 Encounter for palliative care: Secondary | ICD-10-CM | POA: Diagnosis not present

## 2021-01-24 DIAGNOSIS — I10 Essential (primary) hypertension: Secondary | ICD-10-CM | POA: Diagnosis not present

## 2021-01-24 DIAGNOSIS — C3490 Malignant neoplasm of unspecified part of unspecified bronchus or lung: Secondary | ICD-10-CM | POA: Diagnosis not present

## 2021-01-24 LAB — CBC WITH DIFFERENTIAL/PLATELET
Abs Immature Granulocytes: 0.01 10*3/uL (ref 0.00–0.07)
Basophils Absolute: 0 10*3/uL (ref 0.0–0.1)
Basophils Relative: 1 %
Eosinophils Absolute: 0.1 10*3/uL (ref 0.0–0.5)
Eosinophils Relative: 1 %
HCT: 39.1 % (ref 36.0–46.0)
Hemoglobin: 12.2 g/dL (ref 12.0–15.0)
Immature Granulocytes: 0 %
Lymphocytes Relative: 18 %
Lymphs Abs: 0.9 10*3/uL (ref 0.7–4.0)
MCH: 27.1 pg (ref 26.0–34.0)
MCHC: 31.2 g/dL (ref 30.0–36.0)
MCV: 86.9 fL (ref 80.0–100.0)
Monocytes Absolute: 0.4 10*3/uL (ref 0.1–1.0)
Monocytes Relative: 9 %
Neutro Abs: 3.5 10*3/uL (ref 1.7–7.7)
Neutrophils Relative %: 71 %
Platelets: 174 10*3/uL (ref 150–400)
RBC: 4.5 MIL/uL (ref 3.87–5.11)
RDW: 16.6 % — ABNORMAL HIGH (ref 11.5–15.5)
WBC: 4.9 10*3/uL (ref 4.0–10.5)
nRBC: 0 % (ref 0.0–0.2)

## 2021-01-24 LAB — COMPREHENSIVE METABOLIC PANEL
ALT: 18 U/L (ref 0–44)
AST: 27 U/L (ref 15–41)
Albumin: 3.9 g/dL (ref 3.5–5.0)
Alkaline Phosphatase: 106 U/L (ref 38–126)
Anion gap: 8 (ref 5–15)
BUN: 19 mg/dL (ref 8–23)
CO2: 29 mmol/L (ref 22–32)
Calcium: 8.6 mg/dL — ABNORMAL LOW (ref 8.9–10.3)
Chloride: 99 mmol/L (ref 98–111)
Creatinine, Ser: 0.88 mg/dL (ref 0.44–1.00)
GFR, Estimated: >60 mL/min (ref >60)
Glucose, Bld: 101 mg/dL — ABNORMAL HIGH (ref 70–99)
Potassium: 3.4 mmol/L — ABNORMAL LOW (ref 3.5–5.1)
Sodium: 136 mmol/L (ref 135–145)
Total Bilirubin: 0.6 mg/dL (ref 0.3–1.2)
Total Protein: 7.5 g/dL (ref 6.5–8.1)

## 2021-01-24 MED ORDER — SODIUM CHLORIDE 0.9 % IV SOLN
1200.0000 mg | Freq: Once | INTRAVENOUS | Status: AC
  Administered 2021-01-24: 15:00:00 1200 mg via INTRAVENOUS
  Filled 2021-01-24: qty 20

## 2021-01-24 MED ORDER — SODIUM CHLORIDE 0.9% FLUSH
10.0000 mL | INTRAVENOUS | Status: DC | PRN
  Filled 2021-01-24: qty 10

## 2021-01-24 MED ORDER — SODIUM CHLORIDE 0.9 % IV SOLN
Freq: Once | INTRAVENOUS | Status: AC
  Filled 2021-01-24: qty 250

## 2021-01-24 MED ORDER — HEPARIN SOD (PORK) LOCK FLUSH 100 UNIT/ML IV SOLN
500.0000 [IU] | Freq: Once | INTRAVENOUS | Status: AC | PRN
  Administered 2021-01-24: 16:00:00 500 [IU]
  Filled 2021-01-24: qty 5

## 2021-01-25 ENCOUNTER — Telehealth: Payer: Self-pay | Admitting: *Deleted

## 2021-01-25 DIAGNOSIS — R413 Other amnesia: Secondary | ICD-10-CM

## 2021-01-25 NOTE — Telephone Encounter (Signed)
Order placed

## 2021-01-25 NOTE — Telephone Encounter (Signed)
Patient called stating that she thinks she had an appointment yesterday with and she missed it. I checked her chart and informed her that she came for that appointment and got her treatment. She said "I did?!  I must be having a brain fart, I'm getting old" And I told her that yes it is documented that she got it. I also told her her next appointment date 02/14/21 @915 , she said she was writing it down.  I called and spoke with Raul Del, RN who gave her her treatment yesterday and confirmed in fact the patient was there yesterday and seemed a bit off and even recounted their conversations. I called to speak with patient daughter Feliciana Rossetti who guessed that I was calling about patient memory and she informed me that this has been going on for a while and that patient has been seen by a neurologist and has been started on Exelon. She said she speaks with patient daily and usually ends up in tears over her memory loss, stating her mother didn't even remember that she got married 9 years ago. Of note, this patient is driving herself to her appointments.

## 2021-01-31 ENCOUNTER — Telehealth: Payer: Self-pay | Admitting: Nurse Practitioner

## 2021-01-31 NOTE — Telephone Encounter (Signed)
Spoke with patient's daughter Feliciana Rossetti, regarding the Palliative referral and explained what our services were and she wasn't aware of this referral.  Daughter requested that I call the patient back in the morning and speak with her about it to get her verbal okay and then call her back with an update.  If patient is in agreement with our services daughter wanted to be there for our Initial Consult.  Will f/u with patient in AM.

## 2021-02-02 ENCOUNTER — Telehealth: Payer: Self-pay | Admitting: Nurse Practitioner

## 2021-02-02 NOTE — Telephone Encounter (Signed)
Spoke with patient about the Palliative referral and she stated that she wasn't aware of the referral and she wanted to speak with her husband and her daughter Albina Billet about it first and she also wanted to talk with them at the [facility] as well.  I told patient that I would touch base with her next week and f/u with her and she was in agreement with this.

## 2021-02-08 ENCOUNTER — Telehealth: Payer: Self-pay | Admitting: Nurse Practitioner

## 2021-02-08 NOTE — Telephone Encounter (Signed)
Spoke with patient to f/u with her to see if she discussed Palliative services with her husband & daughter and she stated that she forgot all about it, she requested that I call her back in about a week or so - I told her I would call her back the end of next week and she was in agreement with this.

## 2021-02-14 ENCOUNTER — Encounter: Payer: Self-pay | Admitting: Oncology

## 2021-02-14 ENCOUNTER — Other Ambulatory Visit: Payer: PPO

## 2021-02-14 ENCOUNTER — Inpatient Hospital Stay: Payer: PPO

## 2021-02-14 ENCOUNTER — Ambulatory Visit: Payer: PPO | Admitting: Oncology

## 2021-02-14 ENCOUNTER — Other Ambulatory Visit: Payer: Self-pay

## 2021-02-14 ENCOUNTER — Inpatient Hospital Stay: Payer: PPO | Attending: Oncology | Admitting: Oncology

## 2021-02-14 ENCOUNTER — Ambulatory Visit: Payer: PPO

## 2021-02-14 ENCOUNTER — Other Ambulatory Visit: Payer: Self-pay | Admitting: *Deleted

## 2021-02-14 VITALS — BP 105/67 | HR 78 | Temp 98.1°F | Resp 16 | Ht 66.0 in | Wt 182.0 lb

## 2021-02-14 DIAGNOSIS — Z79899 Other long term (current) drug therapy: Secondary | ICD-10-CM | POA: Diagnosis not present

## 2021-02-14 DIAGNOSIS — C3411 Malignant neoplasm of upper lobe, right bronchus or lung: Secondary | ICD-10-CM | POA: Diagnosis not present

## 2021-02-14 DIAGNOSIS — Z87891 Personal history of nicotine dependence: Secondary | ICD-10-CM | POA: Insufficient documentation

## 2021-02-14 DIAGNOSIS — Z5112 Encounter for antineoplastic immunotherapy: Secondary | ICD-10-CM

## 2021-02-14 DIAGNOSIS — E063 Autoimmune thyroiditis: Secondary | ICD-10-CM | POA: Diagnosis not present

## 2021-02-14 DIAGNOSIS — C7951 Secondary malignant neoplasm of bone: Secondary | ICD-10-CM | POA: Insufficient documentation

## 2021-02-14 DIAGNOSIS — Z8041 Family history of malignant neoplasm of ovary: Secondary | ICD-10-CM | POA: Diagnosis not present

## 2021-02-14 DIAGNOSIS — Z79890 Hormone replacement therapy (postmenopausal): Secondary | ICD-10-CM | POA: Insufficient documentation

## 2021-02-14 LAB — CBC WITH DIFFERENTIAL/PLATELET
Abs Immature Granulocytes: 0.02 10*3/uL (ref 0.00–0.07)
Basophils Absolute: 0.1 10*3/uL (ref 0.0–0.1)
Basophils Relative: 1 %
Eosinophils Absolute: 0.1 10*3/uL (ref 0.0–0.5)
Eosinophils Relative: 1 %
HCT: 38.3 % (ref 36.0–46.0)
Hemoglobin: 11.8 g/dL — ABNORMAL LOW (ref 12.0–15.0)
Immature Granulocytes: 0 %
Lymphocytes Relative: 25 %
Lymphs Abs: 1.3 10*3/uL (ref 0.7–4.0)
MCH: 27.2 pg (ref 26.0–34.0)
MCHC: 30.8 g/dL (ref 30.0–36.0)
MCV: 88.2 fL (ref 80.0–100.0)
Monocytes Absolute: 0.4 10*3/uL (ref 0.1–1.0)
Monocytes Relative: 7 %
Neutro Abs: 3.3 10*3/uL (ref 1.7–7.7)
Neutrophils Relative %: 66 %
Platelets: 171 10*3/uL (ref 150–400)
RBC: 4.34 MIL/uL (ref 3.87–5.11)
RDW: 16.9 % — ABNORMAL HIGH (ref 11.5–15.5)
WBC: 5 10*3/uL (ref 4.0–10.5)
nRBC: 0 % (ref 0.0–0.2)

## 2021-02-14 LAB — COMPREHENSIVE METABOLIC PANEL
ALT: 18 U/L (ref 0–44)
AST: 25 U/L (ref 15–41)
Albumin: 3.9 g/dL (ref 3.5–5.0)
Alkaline Phosphatase: 102 U/L (ref 38–126)
Anion gap: 7 (ref 5–15)
BUN: 13 mg/dL (ref 8–23)
CO2: 30 mmol/L (ref 22–32)
Calcium: 8.6 mg/dL — ABNORMAL LOW (ref 8.9–10.3)
Chloride: 101 mmol/L (ref 98–111)
Creatinine, Ser: 0.95 mg/dL (ref 0.44–1.00)
GFR, Estimated: >60 mL/min (ref >60)
Glucose, Bld: 83 mg/dL (ref 70–99)
Potassium: 3.6 mmol/L (ref 3.5–5.1)
Sodium: 138 mmol/L (ref 135–145)
Total Bilirubin: 0.2 mg/dL — ABNORMAL LOW (ref 0.3–1.2)
Total Protein: 7.5 g/dL (ref 6.5–8.1)

## 2021-02-14 LAB — TSH: TSH: 13.346 u[IU]/mL — ABNORMAL HIGH (ref 0.350–4.500)

## 2021-02-14 LAB — T4, FREE: Free T4: 0.6 ng/dL — ABNORMAL LOW (ref 0.61–1.12)

## 2021-02-14 MED ORDER — HEPARIN SOD (PORK) LOCK FLUSH 100 UNIT/ML IV SOLN
500.0000 [IU] | Freq: Once | INTRAVENOUS | Status: AC | PRN
  Administered 2021-02-14: 500 [IU]
  Filled 2021-02-14: qty 5

## 2021-02-14 MED ORDER — SODIUM CHLORIDE 0.9 % IV SOLN
Freq: Once | INTRAVENOUS | Status: AC
  Filled 2021-02-14: qty 250

## 2021-02-14 MED ORDER — SODIUM CHLORIDE 0.9 % IV SOLN
1200.0000 mg | Freq: Once | INTRAVENOUS | Status: AC
  Administered 2021-02-14: 1200 mg via INTRAVENOUS
  Filled 2021-02-14: qty 20

## 2021-02-14 NOTE — Progress Notes (Signed)
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  She has been referred to [place] for findings of bone metastases and her recent MRI.  She has a prior history of 3 packs/day day smoking for over 45 years and quit smoking 5 years ago.She had a CT chest abdomen pelvis in 2018 which showed a 5 mm lung nodule in the left lower lobe.  Recently over the last 2 months patient has been having worsening back pain and was referred to orthopedics.  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  Pathologic fracture of S1.   PET scan showed 2 RUL lung nodules, hilar and mediastinal adenopathy and widespread bone mets. MRI brain negative.   Patient completed palliative RT to her spine. Bronchoscopy showed small cell lung cancer.  Palliative carboplatin, etoposide and Tecentriq started on 08/18/2018. Scans after 4 cycles showed stable disease. She is on maintenance tecentriq Patient has autoimmune hypothyroidism for which she is on levothyroxine but is not compliant with her medication  Interval history-patient reports doing well and denies any specific  complaints at this time.  States that she has been compliant with her levothyroxine  ECOG PS- 1 Pain scale- 0   Review of systems- Review of Systems  Constitutional:  Negative for chills, fever, malaise/fatigue and weight loss.  HENT:  Negative for congestion, ear discharge and nosebleeds.   Eyes:  Negative for blurred vision.  Respiratory:  Negative for cough, hemoptysis, sputum production, shortness of breath and wheezing.   Cardiovascular:  Negative for chest pain, palpitations, orthopnea and claudication.  Gastrointestinal:  Negative for abdominal pain, blood in stool, constipation, diarrhea, heartburn, melena, nausea and vomiting.  Genitourinary:  Negative for dysuria, flank pain, frequency, hematuria and urgency.  Musculoskeletal:  Negative for back pain, joint pain and myalgias.  Skin:  Negative for rash.  Neurological:  Negative for dizziness, tingling, focal weakness, seizures, weakness and headaches.  Endo/Heme/Allergies:  Does not bruise/bleed easily.  Psychiatric/Behavioral:  Negative for depression and suicidal ideas. The patient does not have insomnia.      Allergies  Allergen Reactions   Bee Venom Swelling     Past Medical History:  Diagnosis Date   Arthritis    Cirrhosis ([place])    Depression    GERD (gastroesophageal reflux disease)    Hyperlipidemia    Hypertension    Lung cancer ([place])    Metastatic bone cancer ([place])      Past Surgical History:  Procedure Laterality Date   APPENDECTOMY  1971   CHOLECYSTECTOMY  1971   COLONOSCOPY WITH PROPOFOL N/A 11/15/2016   Procedure: COLONOSCOPY WITH PROPOFOL;  Surgeon: Jonathon Bellows, MD;  Location: [facility] ENDOSCOPY;  Service: Gastroenterology;  Laterality: N/A;  ENDOBRONCHIAL ULTRASOUND Right 07/30/2018   Procedure: ENDOBRONCHIAL ULTRASOUND;  Surgeon: Laverle Hobby, MD;  Location: [facility] ORS;  Service: Pulmonary;  Laterality: Right;   ESOPHAGOGASTRODUODENOSCOPY (EGD) WITH PROPOFOL N/A 01/07/2017   Procedure:  ESOPHAGOGASTRODUODENOSCOPY (EGD) WITH PROPOFOL;  Surgeon: Jonathon Bellows, MD;  Location: [facility] ENDOSCOPY;  Service: Gastroenterology;  Laterality: N/A;   LAPAROSCOPY N/A 03/01/2017   Procedure: LAPAROSCOPY DIAGNOSTIC;  Surgeon: Robert Bellow, MD;  Location: [facility] ORS;  Service: General;  Laterality: N/A;   PORTA CATH INSERTION N/A 08/14/2018   Procedure: PORTA CATH INSERTION;  Surgeon: Algernon Huxley, MD;  Location: [place] CV LAB;  Service: Cardiovascular;  Laterality: N/A;   TONSILECTOMY, ADENOIDECTOMY, BILATERAL MYRINGOTOMY AND TUBES  1955   TONSILLECTOMY     VENTRAL HERNIA REPAIR N/A 03/01/2017   10 x 14 CM Ventralight ST mesh, intraperitoneal location.    VENTRAL HERNIA REPAIR N/A 03/01/2017   Procedure: HERNIA REPAIR VENTRAL ADULT;  Surgeon: Robert Bellow, MD;  Location: [facility] ORS;  Service: General;  Laterality: N/A;    Social History   Socioeconomic History   Marital status: Married    Spouse name: Johnny   Number of children: Not on file   Years of education: Not on file   Highest education level: Not on file  Occupational History   Not on file  Tobacco Use   Smoking status: Former    Packs/day: 2.00    Years: 50.00    Pack years: 100.00    Types: Cigarettes, E-cigarettes    Quit date: 02/07/2012    Years since quitting: 9.0   Smokeless tobacco: Never  Vaping Use   Vaping Use: Some days   Start date: 10/06/2013   Devices: uses no liquid  Substance and Sexual Activity   Alcohol use: No    Alcohol/week: 0.0 standard drinks   Drug use: No   Sexual activity: Yes  Other Topics Concern   Not on file  Social History Narrative   Married   Retired   Clinical cytogeneticist level of education    No children    1 cup of coffee   Social Determinants of Radio broadcast assistant Strain: Low Risk    Difficulty of Paying Living Expenses: Not hard at all  Food Insecurity: No Food Insecurity   Worried About Charity fundraiser in the Last Year: Never true   Academic librarian in the Last Year: Never true  Transportation Needs: No Transportation Needs   Lack of Transportation (Medical): No   Lack of Transportation (Non-Medical): No  Physical Activity: Not on file  Stress: No Stress Concern Present   Feeling of Stress : Not at all  Social Connections: Unknown   Frequency of Communication with Friends and Family: More than three times a week   Frequency of Social Gatherings with Friends and Family: Not on file   Attends Religious Services: Not on file   Active Member of Clubs or Organizations: Not on file   Attends Archivist Meetings: Not on file   Marital Status: Married  Human resources officer Violence: Not At Risk   Fear of Current or Ex-Partner: No   Emotionally Abused: No   Physically Abused: No   Sexually Abused: No    Family History  Problem Relation Age of Onset   Hypertension Mother    Ovarian cancer Mother 70   Heart disease Father    Stroke Father    Ovarian cancer Sister        ?  dx cancer had hyst.   Breast cancer Neg Hx      Current Outpatient Medications:    amLODipine (NORVASC) 5 MG tablet, Take 1 tablet (5 mg total) by mouth daily., Disp: 90 tablet, Rfl: 3   BINAXNOW COVID-19 AG HOME TEST KIT, , Disp: , Rfl:    buPROPion (WELLBUTRIN XL) 300 MG 24 hr tablet, Take 1 tablet (300 mg total) by mouth daily., Disp: 90 tablet, Rfl: 1   clobetasol cream (TEMOVATE) 5.17 %, Apply 1 application topically 2 (two) times daily. Lower back, Disp: 60 g, Rfl: 1   erythromycin ophthalmic ointment, Place 1 application into the right eye daily as needed., Disp: , Rfl:    escitalopram (LEXAPRO) 20 MG tablet, Take 1 tablet (20 mg total) by mouth daily., Disp: 90 tablet, Rfl: 1   fluticasone (FLONASE) 50 MCG/ACT nasal spray, Place 2 sprays into both nostrils daily., Disp: 16 g, Rfl: 6   folic acid (FOLVITE) 1 MG tablet, Take 1 tablet by mouth once daily, Disp: 90 tablet, Rfl: 0   ibuprofen (ADVIL,MOTRIN) 200 MG tablet, Take 400 mg by mouth every  8 (eight) hours as needed for headache or moderate pain. , Disp: , Rfl:    levothyroxine (SYNTHROID) 100 MCG tablet, Take 1 tablet (100 mcg total) by mouth daily before breakfast., Disp: 30 tablet, Rfl: 1   lidocaine-prilocaine (EMLA) cream, Apply to affected area once, Disp: 30 g, Rfl: 3   loperamide (IMODIUM) 2 MG capsule, Take 1 capsule (2 mg total) by mouth every 2 (two) hours as needed for diarrhea or loose stools. Do not exceed 51m daily., Disp: 30 capsule, Rfl: 1   Multiple Vitamins-Minerals (PRESERVISION AREDS 2) CAPS, Take 1 capsule by mouth 2 (two) times a day. , Disp: , Rfl:    ondansetron (ZOFRAN ODT) 4 MG disintegrating tablet, Take 1 tablet (4 mg total) by mouth every 8 (eight) hours as needed for nausea or vomiting., Disp: 20 tablet, Rfl: 0   pantoprazole (PROTONIX) 40 MG tablet, TAKE 1 TABLET BY MOUTH TWICE DAILY BEFORE A MEAL, Disp: 180 tablet, Rfl: 0   polyethylene glycol (MIRALAX / GLYCOLAX) 17 g packet, Take 17 g by mouth daily as needed for mild constipation., Disp: 14 each, Rfl: 0   rivastigmine (EXELON) 1.5 MG capsule, Take 1 capsule (1.5 mg total) by mouth 2 (two) times daily., Disp: 60 capsule, Rfl: 3   rosuvastatin (CRESTOR) 20 MG tablet, Take 1 tablet by mouth once daily, Disp: 90 tablet, Rfl: 0   valACYclovir (VALTREX) 1000 MG tablet, Take 1 tablet (1,000 mg total) by mouth 3 (three) times daily. With food x 7-10 days, Disp: 30 tablet, Rfl: 0 No current facility-administered medications for this visit.  Facility-Administered Medications Ordered in Other Visits:    denosumab (XGEVA) injection 120 mg, 120 mg, Subcutaneous, Q30 days, RSindy Guadeloupe MD, 120 mg at 03/05/19 1525  Physical exam:  Vitals:   02/14/21 1330  BP: 105/67  Pulse: 78  Resp: 16  Temp: 98.1 F (36.7 C)  TempSrc: Tympanic  SpO2: 96%  Weight: 182 lb (82.6 kg)  Height: 5' 6" (1.676 m)   Physical Exam Cardiovascular:     Rate and Rhythm: Normal rate and regular rhythm.     Heart sounds:  Normal heart sounds.  Pulmonary:     Effort: Pulmonary effort is normal.     Breath sounds: Normal breath sounds.  Abdominal:     General: Bowel sounds are normal.     Palpations: Abdomen is  soft.  Skin:    General: Skin is warm and dry.  Neurological:     Mental Status: She is alert and oriented to person, place, and time.     CMP Latest Ref Rng & Units 02/14/2021  Glucose 70 - 99 mg/dL 83  BUN 8 - 23 mg/dL 13  Creatinine 0.44 - 1.00 mg/dL 0.95  Sodium 135 - 145 mmol/L 138  Potassium 3.5 - 5.1 mmol/L 3.6  Chloride 98 - 111 mmol/L 101  CO2 22 - 32 mmol/L 30  Calcium 8.9 - 10.3 mg/dL 8.6(L)  Total Protein 6.5 - 8.1 g/dL 7.5  Total Bilirubin 0.3 - 1.2 mg/dL 0.2(L)  Alkaline Phos 38 - 126 U/L 102  AST 15 - 41 U/L 25  ALT 0 - 44 U/L 18   CBC Latest Ref Rng & Units 02/14/2021  WBC 4.0 - 10.5 K/uL 5.0  Hemoglobin 12.0 - 15.0 g/dL 11.8(L)  Hematocrit 36.0 - 46.0 % 38.3  Platelets 150 - 400 K/uL 171    No images are attached to the encounter.  No results found.   Assessment and plan- Patient is a 71 y.o. female with extensive stage small cell lung cancer on maintenance Tecentriq here for a routine follow-up  Patient has been on treatment for extensive stage small cell lung cancer since July 2020 with stable disease.  She has responded very well to immunotherapy and my plan is to continue Tecentriq until progression or toxicity.  She will proceed with cycle 38 of maintenance Tecentriq today and I will see her back in 3 weeks for cycle 39.  Plan to repeat scans prior to next cycle  Bone metastases: Unable to receive Zometa or Xgeva given significant hypocalcemia following that in the past  Autoimmune hypothyroidism:Patient reports being compliant with her levothyroxine but her numbers have been all over the place from being hypothyroid to hyperthyroid.  Presently she has labs suggestive of hypothyroidism.  I would like to keep an eye on her TSH for the next 2-3 values before dialing  up the dose of levothyroxine further.   Visit Diagnosis 1. Small cell lung cancer, right upper lobe ([place])   2. Encounter for antineoplastic immunotherapy      Dr. Randa Evens, MD, MPH [facility] [phone] 02/14/2021 4:36 PM

## 2021-02-14 NOTE — Progress Notes (Unsigned)
Orders for scan entered

## 2021-02-14 NOTE — Patient Instructions (Signed)
[facility] CANCER CTR AT [place]  Discharge Instructions: Thank you for choosing [facility] to provide your oncology and hematology care.  If you have a lab appointment with the [place], please go directly to the [place] and check in at the registration area.  Wear comfortable clothing and clothing appropriate for easy access to any Portacath or PICC line.   We strive to give you quality time with your provider. You may need to reschedule your appointment if you arrive late (15 or more minutes).  Arriving late affects you and other patients whose appointments are after yours.  Also, if you miss three or more appointments without notifying the office, you may be dismissed from the clinic at the providers discretion.      For prescription refill requests, have your pharmacy contact our office and allow 72 hours for refills to be completed.    Today you received the following chemotherapy and/or immunotherapy agents TECENTRIQ      To help prevent nausea and vomiting after your treatment, we encourage you to take your nausea medication as directed.  BELOW ARE SYMPTOMS THAT SHOULD BE REPORTED IMMEDIATELY: *FEVER GREATER THAN 100.4 F (38 C) OR HIGHER *CHILLS OR SWEATING *NAUSEA AND VOMITING THAT IS NOT CONTROLLED WITH YOUR NAUSEA MEDICATION *UNUSUAL SHORTNESS OF BREATH *UNUSUAL BRUISING OR BLEEDING *URINARY PROBLEMS (pain or burning when urinating, or frequent urination) *BOWEL PROBLEMS (unusual diarrhea, constipation, pain near the anus) TENDERNESS IN MOUTH AND THROAT WITH OR WITHOUT PRESENCE OF ULCERS (sore throat, sores in mouth, or a toothache) UNUSUAL RASH, SWELLING OR PAIN  UNUSUAL VAGINAL DISCHARGE OR ITCHING   Items with * indicate a potential emergency and should be followed up as soon as possible or go to the Emergency Department if any problems should occur.  Please show the CHEMOTHERAPY ALERT CARD or IMMUNOTHERAPY ALERT CARD at check-in to  the Emergency Department and triage nurse.  Should you have questions after your visit or need to cancel or reschedule your appointment, please contact [facility] CANCER [address] AT [place]  [phone] and follow the prompts.  Office hours are 8:00 a.m. to 4:30 p.m. Monday - Friday. Please note that voicemails left after 4:00 p.m. may not be returned until the following business day.  We are closed weekends and major holidays. You have access to a nurse at all times for urgent questions. Please call the main number to the clinic [phone] and follow the prompts.  For any non-urgent questions, you may also contact your provider using MyChart. We now offer e-Visits for anyone 34 and older to request care online for non-urgent symptoms. For details visit [email or web address].   Also download the MyChart app! Go to the app store, search "MyChart", open the app, select Center, and log in with your MyChart username and password.  Due to Covid, a mask is required upon entering the hospital/clinic. If you do not have a mask, one will be given to you upon arrival. For doctor visits, patients may have 1 support person aged 35 or older with them. For treatment visits, patients cannot have anyone with them due to current Covid guidelines and our immunocompromised population.   Atezolizumab injection What is this medication? ATEZOLIZUMAB (a te zoe LIZ ue mab) is a monoclonal antibody. It is used to treat bladder cancer (urothelial cancer), liver cancer, lung cancer, and melanoma. This medicine may be used for other purposes; ask your health care provider or pharmacist if you have questions. COMMON BRAND NAME(S): [employer]  What should I tell my care team before I take this medication? They need to know if you have any of these conditions: autoimmune diseases like Crohn's disease, ulcerative colitis, or lupus have had or planning to have an allogeneic stem cell transplant (uses someone else's  stem cells) history of organ transplant history of radiation to the chest nervous system problems like myasthenia gravis or Guillain-Barre syndrome an unusual or allergic reaction to atezolizumab, other medicines, foods, dyes, or preservatives pregnant or trying to get pregnant breast-feeding How should I use this medication? This medicine is for infusion into a vein. It is given by a health care professional in a hospital or clinic setting. A special MedGuide will be given to you before each treatment. Be sure to read this information carefully each time. Talk to your pediatrician regarding the use of this medicine in children. Special care may be needed. Overdosage: If you think you have taken too much of this medicine contact a poison control center or emergency room at once. NOTE: This medicine is only for you. Do not share this medicine with others. What if I miss a dose? It is important not to miss your dose. Call your doctor or health care professional if you are unable to keep an appointment. What may interact with this medication? Interactions have not been studied. This list may not describe all possible interactions. Give your health care provider a list of all the medicines, herbs, non-prescription drugs, or dietary supplements you use. Also tell them if you smoke, drink alcohol, or use illegal drugs. Some items may interact with your medicine. What should I watch for while using this medication? Your condition will be monitored carefully while you are receiving this medicine. You may need blood work done while you are taking this medicine. Do not become pregnant while taking this medicine or for at least 5 months after stopping it. Women should inform their doctor if they wish to become pregnant or think they might be pregnant. There is a potential for serious side effects to an unborn child. Talk to your health care professional or pharmacist for more information. Do not  breast-feed an infant while taking this medicine or for at least 5 months after the last dose. What side effects may I notice from receiving this medication? Side effects that you should report to your doctor or health care professional as soon as possible: allergic reactions like skin rash, itching or hives, swelling of the face, lips, or tongue black, tarry stools bloody or watery diarrhea breathing problems changes in vision chest pain or chest tightness chills facial flushing fever headache signs and symptoms of high blood sugar such as dizziness; dry mouth; dry skin; fruity breath; nausea; stomach pain; increased hunger or thirst; increased urination signs and symptoms of liver injury like dark yellow or brown urine; general ill feeling or flu-like symptoms; light-colored stools; loss of appetite; nausea; right upper belly pain; unusually weak or tired; yellowing of the eyes or skin stomach pain trouble passing urine or change in the amount of urine Side effects that usually do not require medical attention (report to your doctor or health care professional if they continue or are bothersome): bone pain cough diarrhea joint pain muscle pain muscle weakness swelling of arms or legs tiredness weight loss This list may not describe all possible side effects. Call your doctor for medical advice about side effects. You may report side effects to FDA at 1-800-FDA-1088. Where should I keep my medication? This drug  is given in a hospital or clinic and will not be stored at home. NOTE: This sheet is a summary. It may not cover all possible information. If you have questions about this medicine, talk to your doctor, pharmacist, or health care provider.  2022 Elsevier/Gold Standard (2020-10-11 00:00:00)

## 2021-02-15 ENCOUNTER — Encounter: Payer: Self-pay | Admitting: Oncology

## 2021-03-01 ENCOUNTER — Other Ambulatory Visit: Payer: Self-pay

## 2021-03-01 ENCOUNTER — Encounter
Admission: RE | Admit: 2021-03-01 | Discharge: 2021-03-01 | Disposition: A | Discharge status: Home / Self Care | Payer: PPO | Source: Ambulatory Visit | Attending: Oncology | Admitting: Oncology

## 2021-03-01 DIAGNOSIS — M438X5 Other specified deforming dorsopathies, thoracolumbar region: Secondary | ICD-10-CM | POA: Diagnosis not present

## 2021-03-01 DIAGNOSIS — C3490 Malignant neoplasm of unspecified part of unspecified bronchus or lung: Secondary | ICD-10-CM | POA: Diagnosis not present

## 2021-03-01 DIAGNOSIS — M4316 Spondylolisthesis, lumbar region: Secondary | ICD-10-CM | POA: Diagnosis not present

## 2021-03-01 DIAGNOSIS — J439 Emphysema, unspecified: Secondary | ICD-10-CM | POA: Insufficient documentation

## 2021-03-01 DIAGNOSIS — I700 Atherosclerosis of aorta: Secondary | ICD-10-CM | POA: Insufficient documentation

## 2021-03-01 DIAGNOSIS — I2510 Atherosclerotic heart disease of native coronary artery without angina pectoris: Secondary | ICD-10-CM | POA: Diagnosis not present

## 2021-03-01 DIAGNOSIS — C7951 Secondary malignant neoplasm of bone: Secondary | ICD-10-CM | POA: Insufficient documentation

## 2021-03-01 DIAGNOSIS — K7689 Other specified diseases of liver: Secondary | ICD-10-CM | POA: Diagnosis not present

## 2021-03-01 DIAGNOSIS — C3411 Malignant neoplasm of upper lobe, right bronchus or lung: Secondary | ICD-10-CM | POA: Insufficient documentation

## 2021-03-01 DIAGNOSIS — M438X6 Other specified deforming dorsopathies, lumbar region: Secondary | ICD-10-CM | POA: Diagnosis not present

## 2021-03-01 MED ORDER — TECHNETIUM TC 99M MEDRONATE IV KIT
20.0000 | PACK | Freq: Once | INTRAVENOUS | Status: AC | PRN
  Administered 2021-03-01: 10:00:00 20.8 via INTRAVENOUS

## 2021-03-01 MED ORDER — IOHEXOL 300 MG/ML  SOLN
100.0000 mL | Freq: Once | INTRAMUSCULAR | Status: AC | PRN
  Administered 2021-03-01: 10:00:00 100 mL via INTRAVENOUS

## 2021-03-02 ENCOUNTER — Other Ambulatory Visit: Payer: Self-pay | Admitting: Family Medicine

## 2021-03-02 DIAGNOSIS — I10 Essential (primary) hypertension: Secondary | ICD-10-CM

## 2021-03-07 ENCOUNTER — Inpatient Hospital Stay: Payer: PPO

## 2021-03-07 ENCOUNTER — Telehealth: Payer: Self-pay

## 2021-03-07 ENCOUNTER — Encounter: Payer: Self-pay | Admitting: Oncology

## 2021-03-07 ENCOUNTER — Other Ambulatory Visit: Payer: Self-pay | Admitting: Family Medicine

## 2021-03-07 ENCOUNTER — Other Ambulatory Visit: Payer: Self-pay

## 2021-03-07 ENCOUNTER — Inpatient Hospital Stay (HOSPITAL_BASED_OUTPATIENT_CLINIC_OR_DEPARTMENT_OTHER): Payer: PPO | Admitting: Oncology

## 2021-03-07 VITALS — BP 140/80 | HR 74 | Temp 98.6°F | Resp 16 | Ht 66.0 in | Wt 181.4 lb

## 2021-03-07 DIAGNOSIS — Z5112 Encounter for antineoplastic immunotherapy: Secondary | ICD-10-CM | POA: Diagnosis not present

## 2021-03-07 DIAGNOSIS — E063 Autoimmune thyroiditis: Secondary | ICD-10-CM | POA: Diagnosis not present

## 2021-03-07 DIAGNOSIS — I10 Essential (primary) hypertension: Secondary | ICD-10-CM

## 2021-03-07 DIAGNOSIS — C3411 Malignant neoplasm of upper lobe, right bronchus or lung: Secondary | ICD-10-CM

## 2021-03-07 LAB — CBC WITH DIFFERENTIAL/PLATELET
Abs Immature Granulocytes: 0.01 10*3/uL (ref 0.00–0.07)
Basophils Absolute: 0.1 10*3/uL (ref 0.0–0.1)
Basophils Relative: 1 %
Eosinophils Absolute: 0.2 10*3/uL (ref 0.0–0.5)
Eosinophils Relative: 3 %
HCT: 39.6 % (ref 36.0–46.0)
Hemoglobin: 12.6 g/dL (ref 12.0–15.0)
Immature Granulocytes: 0 %
Lymphocytes Relative: 20 %
Lymphs Abs: 1.1 10*3/uL (ref 0.7–4.0)
MCH: 28.1 pg (ref 26.0–34.0)
MCHC: 31.8 g/dL (ref 30.0–36.0)
MCV: 88.4 fL (ref 80.0–100.0)
Monocytes Absolute: 0.4 10*3/uL (ref 0.1–1.0)
Monocytes Relative: 7 %
Neutro Abs: 3.7 10*3/uL (ref 1.7–7.7)
Neutrophils Relative %: 69 %
Platelets: 185 10*3/uL (ref 150–400)
RBC: 4.48 MIL/uL (ref 3.87–5.11)
RDW: 15.9 % — ABNORMAL HIGH (ref 11.5–15.5)
WBC: 5.3 10*3/uL (ref 4.0–10.5)
nRBC: 0 % (ref 0.0–0.2)

## 2021-03-07 LAB — COMPREHENSIVE METABOLIC PANEL
ALT: 19 U/L (ref 0–44)
AST: 32 U/L (ref 15–41)
Albumin: 4.1 g/dL (ref 3.5–5.0)
Alkaline Phosphatase: 91 U/L (ref 38–126)
Anion gap: 8 (ref 5–15)
BUN: 15 mg/dL (ref 8–23)
CO2: 30 mmol/L (ref 22–32)
Calcium: 9.3 mg/dL (ref 8.9–10.3)
Chloride: 99 mmol/L (ref 98–111)
Creatinine, Ser: 1.12 mg/dL — ABNORMAL HIGH (ref 0.44–1.00)
GFR, Estimated: 52 mL/min — ABNORMAL LOW (ref >60)
Glucose, Bld: 95 mg/dL (ref 70–99)
Potassium: 3.9 mmol/L (ref 3.5–5.1)
Sodium: 137 mmol/L (ref 135–145)
Total Bilirubin: 0.3 mg/dL (ref 0.3–1.2)
Total Protein: 7.6 g/dL (ref 6.5–8.1)

## 2021-03-07 MED ORDER — SODIUM CHLORIDE 0.9% FLUSH
10.0000 mL | INTRAVENOUS | Status: DC | PRN
  Filled 2021-03-07: qty 10

## 2021-03-07 MED ORDER — HEPARIN SOD (PORK) LOCK FLUSH 100 UNIT/ML IV SOLN
INTRAVENOUS | Status: AC
  Administered 2021-03-07: 500 [IU]
  Filled 2021-03-07: qty 5

## 2021-03-07 MED ORDER — SODIUM CHLORIDE 0.9 % IV SOLN
1200.0000 mg | Freq: Once | INTRAVENOUS | Status: AC
  Administered 2021-03-07: 1200 mg via INTRAVENOUS
  Filled 2021-03-07: qty 20

## 2021-03-07 MED ORDER — HEPARIN SOD (PORK) LOCK FLUSH 100 UNIT/ML IV SOLN
500.0000 [IU] | Freq: Once | INTRAVENOUS | Status: AC | PRN
  Filled 2021-03-07: qty 5

## 2021-03-07 MED ORDER — SODIUM CHLORIDE 0.9 % IV SOLN
Freq: Once | INTRAVENOUS | Status: AC
  Filled 2021-03-07: qty 250

## 2021-03-07 NOTE — Progress Notes (Signed)
Pt states she is doing ok, no concerns

## 2021-03-07 NOTE — Patient Instructions (Signed)
[facility] AT [place]  Discharge Instructions: Thank you for choosing [facility] to provide your oncology and hematology care.  If you have a lab appointment with the [facility], please go directly to the [facility] and check in at the registration area.  Wear comfortable clothing and clothing appropriate for easy access to any Portacath or PICC line.   We strive to give you quality time with your provider. You may need to reschedule your appointment if you arrive late (15 or more minutes).  Arriving late affects you and other patients whose appointments are after yours.  Also, if you miss three or more appointments without notifying the office, you may be dismissed from the clinic at the providers discretion.      For prescription refill requests, have your pharmacy contact our office and allow 72 hours for refills to be completed.    Today you received the following chemotherapy and/or immunotherapy agents - atezolizumab      To help prevent nausea and vomiting after your treatment, we encourage you to take your nausea medication as directed.  BELOW ARE SYMPTOMS THAT SHOULD BE REPORTED IMMEDIATELY: *FEVER GREATER THAN 100.4 F (38 C) OR HIGHER *CHILLS OR SWEATING *NAUSEA AND VOMITING THAT IS NOT CONTROLLED WITH YOUR NAUSEA MEDICATION *UNUSUAL SHORTNESS OF BREATH *UNUSUAL BRUISING OR BLEEDING *URINARY PROBLEMS (pain or burning when urinating, or frequent urination) *BOWEL PROBLEMS (unusual diarrhea, constipation, pain near the anus) TENDERNESS IN MOUTH AND THROAT WITH OR WITHOUT PRESENCE OF ULCERS (sore throat, sores in mouth, or a toothache) UNUSUAL RASH, SWELLING OR PAIN  UNUSUAL VAGINAL DISCHARGE OR ITCHING   Items with * indicate a potential emergency and should be followed up as soon as possible or go to the Emergency Department if any problems should occur.  Please show the CHEMOTHERAPY ALERT CARD or IMMUNOTHERAPY ALERT CARD at check-in  to the Emergency Department and triage nurse.  Should you have questions after your visit or need to cancel or reschedule your appointment, please contact [facility] [place] [facility] [place]  [phone] and follow the prompts.  Office hours are 8:00 a.m. to 4:30 p.m. Monday - Friday. Please note that voicemails left after 4:00 p.m. may not be returned until the following business day.  We are closed weekends and major holidays. You have access to a nurse at all times for urgent questions. Please call the main number to the clinic [phone] and follow the prompts.  For any non-urgent questions, you may also contact your provider using MyChart. We now offer e-Visits for anyone 68 and older to request care online for non-urgent symptoms. For details visit [email or web address].   Also download the MyChart app! Go to the app store, search "MyChart", open the app, select [facility], and log in with your MyChart username and password.  Due to Covid, a mask is required upon entering the hospital/clinic. If you do not have a mask, one will be given to you upon arrival. For doctor visits, patients may have 1 support person aged 63 or older with them. For treatment visits, patients cannot have anyone with them due to current Covid guidelines and our immunocompromised population.   Atezolizumab injection What is this medication? ATEZOLIZUMAB (a te zoe LIZ ue mab) is a monoclonal antibody. It is used to treat bladder cancer (urothelial cancer), liver cancer, lung cancer, and melanoma. This medicine may be used for other purposes; ask your health care provider or pharmacist if you have questions. COMMON BRAND NAME(S):  Tecentriq What should I tell my care team before I take this medication? They need to know if you have any of these conditions: autoimmune diseases like Crohn's disease, ulcerative colitis, or lupus have had or planning to have an allogeneic stem cell transplant (uses someone  else's stem cells) history of organ transplant history of radiation to the chest nervous system problems like myasthenia gravis or Guillain-Barre syndrome an unusual or allergic reaction to atezolizumab, other medicines, foods, dyes, or preservatives pregnant or trying to get pregnant breast-feeding How should I use this medication? This medicine is for infusion into a vein. It is given by a health care professional in a hospital or clinic setting. A special MedGuide will be given to you before each treatment. Be sure to read this information carefully each time. Talk to your pediatrician regarding the use of this medicine in children. Special care may be needed. Overdosage: If you think you have taken too much of this medicine contact a poison control center or emergency room at once. NOTE: This medicine is only for you. Do not share this medicine with others. What if I miss a dose? It is important not to miss your dose. Call your doctor or health care professional if you are unable to keep an appointment. What may interact with this medication? Interactions have not been studied. This list may not describe all possible interactions. Give your health care provider a list of all the medicines, herbs, non-prescription drugs, or dietary supplements you use. Also tell them if you smoke, drink alcohol, or use illegal drugs. Some items may interact with your medicine. What should I watch for while using this medication? Your condition will be monitored carefully while you are receiving this medicine. You may need blood work done while you are taking this medicine. Do not become pregnant while taking this medicine or for at least 5 months after stopping it. Women should inform their doctor if they wish to become pregnant or think they might be pregnant. There is a potential for serious side effects to an unborn child. Talk to your health care professional or pharmacist for more information. Do not  breast-feed an infant while taking this medicine or for at least 5 months after the last dose. What side effects may I notice from receiving this medication? Side effects that you should report to your doctor or health care professional as soon as possible: allergic reactions like skin rash, itching or hives, swelling of the face, lips, or tongue black, tarry stools bloody or watery diarrhea breathing problems changes in vision chest pain or chest tightness chills facial flushing fever headache signs and symptoms of high blood sugar such as dizziness; dry mouth; dry skin; fruity breath; nausea; stomach pain; increased hunger or thirst; increased urination signs and symptoms of liver injury like dark yellow or brown urine; general ill feeling or flu-like symptoms; light-colored stools; loss of appetite; nausea; right upper belly pain; unusually weak or tired; yellowing of the eyes or skin stomach pain trouble passing urine or change in the amount of urine Side effects that usually do not require medical attention (report to your doctor or health care professional if they continue or are bothersome): bone pain cough diarrhea joint pain muscle pain muscle weakness swelling of arms or legs tiredness weight loss This list may not describe all possible side effects. Call your doctor for medical advice about side effects. You may report side effects to FDA at 1-800-FDA-1088. Where should I keep my medication? This  drug is given in a hospital or clinic and will not be stored at home. NOTE: This sheet is a summary. It may not cover all possible information. If you have questions about this medicine, talk to your doctor, pharmacist, or health care provider.  2022 [employer] Standard (2020-10-11 00:00:00)

## 2021-03-07 NOTE — Telephone Encounter (Signed)
Follow up call to schedule palliative consult with patient.  Pt declines palliative services at this time. Notified referral source.

## 2021-03-07 NOTE — Progress Notes (Signed)
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  She has been referred to [place] for findings of bone metastases and her recent MRI.  She has a prior history of 3 packs/day day smoking for over 45 years and quit smoking 5 years ago.She had a CT chest abdomen pelvis in 2018 which showed a 5 mm lung nodule in the left lower lobe.  Recently over the last 2 months patient has been having worsening back pain and was referred to orthopedics.  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  Pathologic fracture of S1.   PET scan showed 2 RUL lung nodules, hilar and mediastinal adenopathy and widespread bone mets. MRI brain negative.   Patient completed palliative RT to her spine. Bronchoscopy showed small cell lung cancer.  Palliative carboplatin, etoposide and Tecentriq started on 08/18/2018. Scans after 4 cycles showed stable disease. 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  She reports being compliant but her values fluctuate widely.     Interval history-patient is presently  doing well and denies any specific complaints at this time.  ECOG PS- 1 Pain scale- 0   Review of systems- Review of Systems  Constitutional:  Negative for chills, fever, malaise/fatigue and weight loss.  HENT:  Negative for congestion, ear discharge and nosebleeds.   Eyes:  Negative for blurred vision.  Respiratory:  Negative for cough, hemoptysis, sputum production, shortness of breath and wheezing.   Cardiovascular:  Negative for chest pain, palpitations, orthopnea and claudication.  Gastrointestinal:  Negative for abdominal pain, blood in stool, constipation, diarrhea, heartburn, melena, nausea and vomiting.  Genitourinary:  Negative for dysuria, flank pain, frequency, hematuria and urgency.  Musculoskeletal:  Negative for back pain, joint pain and myalgias.  Skin:  Negative for rash.  Neurological:  Negative for dizziness, tingling, focal weakness, seizures, weakness and headaches.  Endo/Heme/Allergies:  Does not bruise/bleed easily.  Psychiatric/Behavioral:  Negative for depression and suicidal ideas. The patient does not have insomnia.      Allergies  Allergen Reactions   Bee Venom Swelling     Past Medical History:  Diagnosis Date   Arthritis    Cirrhosis ([place])    Depression    GERD (gastroesophageal reflux disease)    Hyperlipidemia    Hypertension    Lung cancer ([place])    Metastatic bone cancer ([place])      Past Surgical History:  Procedure Laterality Date   APPENDECTOMY  1971   CHOLECYSTECTOMY  1971   COLONOSCOPY WITH PROPOFOL N/A 11/15/2016   Procedure: COLONOSCOPY WITH PROPOFOL;  Surgeon: Jonathon Bellows, MD;  Location: [facility] ENDOSCOPY;  Service: Gastroenterology;  Laterality: N/A;   ENDOBRONCHIAL ULTRASOUND Right 07/30/2018  Procedure: ENDOBRONCHIAL ULTRASOUND;  Surgeon: Laverle Hobby, MD;  Location: [facility] ORS;  Service: Pulmonary;  Laterality: Right;   ESOPHAGOGASTRODUODENOSCOPY (EGD) WITH PROPOFOL N/A 01/07/2017   Procedure: ESOPHAGOGASTRODUODENOSCOPY  (EGD) WITH PROPOFOL;  Surgeon: Jonathon Bellows, MD;  Location: [facility] ENDOSCOPY;  Service: Gastroenterology;  Laterality: N/A;   LAPAROSCOPY N/A 03/01/2017   Procedure: LAPAROSCOPY DIAGNOSTIC;  Surgeon: Robert Bellow, MD;  Location: [facility] ORS;  Service: General;  Laterality: N/A;   PORTA CATH INSERTION N/A 08/14/2018   Procedure: PORTA CATH INSERTION;  Surgeon: Algernon Huxley, MD;  Location: [place] [facility];  Service: Cardiovascular;  Laterality: N/A;   TONSILECTOMY, ADENOIDECTOMY, BILATERAL MYRINGOTOMY AND TUBES  1955   TONSILLECTOMY     VENTRAL HERNIA REPAIR N/A 03/01/2017   10 x 14 CM Ventralight ST mesh, intraperitoneal location.    VENTRAL HERNIA REPAIR N/A 03/01/2017   Procedure: HERNIA REPAIR VENTRAL ADULT;  Surgeon: Robert Bellow, MD;  Location: [facility] ORS;  Service: General;  Laterality: N/A;    Social History   Socioeconomic History   Marital status: Married    Spouse name: Johnny   Number of children: Not on file   Years of education: Not on file   Highest education level: Not on file  Occupational History   Not on file  Tobacco Use   Smoking status: Former    Packs/day: 2.00    Years: 50.00    Pack years: 100.00    Types: Cigarettes, E-cigarettes    Quit date: 02/07/2012    Years since quitting: 9.0   Smokeless tobacco: Never  Vaping Use   Vaping Use: Some days   Start date: 10/06/2013   Devices: uses no liquid  Substance and Sexual Activity   Alcohol use: No    Alcohol/week: 0.0 standard drinks   Drug use: No   Sexual activity: Yes  Other Topics Concern   Not on file  Social History Narrative   Married   Retired   Clinical cytogeneticist level of education    No children    1 cup of coffee   Social Determinants of Radio broadcast assistant Strain: Low Risk    Difficulty of Paying Living Expenses: Not hard at all  Food Insecurity: No Food Insecurity   Worried About Charity fundraiser in the Last Year: Never true   Arboriculturist in the Last Year: Never  true  Transportation Needs: No Transportation Needs   Lack of Transportation (Medical): No   Lack of Transportation (Non-Medical): No  Physical Activity: Not on file  Stress: No Stress Concern Present   Feeling of Stress : Not at all  Social Connections: Unknown   Frequency of Communication with Friends and Family: More than three times a week   Frequency of Social Gatherings with Friends and Family: Not on file   Attends Religious Services: Not on Electrical engineer or Organizations: Not on file   Attends Archivist Meetings: Not on file   Marital Status: Married  Human resources officer Violence: Not At Risk   Fear of Current or Ex-Partner: No   Emotionally Abused: No   Physically Abused: No   Sexually Abused: No    Family History  Problem Relation Age of Onset   Hypertension Mother    Ovarian cancer Mother 99   Heart disease Father    Stroke Father    Ovarian cancer Sister        ? dx cancer had hyst.  Breast cancer Neg Hx      Current Outpatient Medications:    amLODipine (NORVASC) 5 MG tablet, Take 1 tablet (5 mg total) by mouth daily., Disp: 90 tablet, Rfl: 3   buPROPion (WELLBUTRIN XL) 300 MG 24 hr tablet, Take 1 tablet (300 mg total) by mouth daily., Disp: 90 tablet, Rfl: 1   clobetasol cream (TEMOVATE) 1.61 %, Apply 1 application topically 2 (two) times daily. Lower back, Disp: 60 g, Rfl: 1   erythromycin ophthalmic ointment, Place 1 application into the right eye daily as needed., Disp: , Rfl:    escitalopram (LEXAPRO) 20 MG tablet, Take 1 tablet (20 mg total) by mouth daily., Disp: 90 tablet, Rfl: 1   fluticasone (FLONASE) 50 MCG/ACT nasal spray, Place 2 sprays into both nostrils daily. (Patient taking differently: Place 2 sprays into both nostrils daily as needed.), Disp: 16 g, Rfl: 6   folic acid (FOLVITE) 1 MG tablet, Take 1 tablet by mouth once daily, Disp: 90 tablet, Rfl: 0   ibuprofen (ADVIL,MOTRIN) 200 MG tablet, Take 400 mg by mouth every 8  (eight) hours as needed for headache or moderate pain. , Disp: , Rfl:    levothyroxine (SYNTHROID) 100 MCG tablet, Take 1 tablet (100 mcg total) by mouth daily before breakfast., Disp: 30 tablet, Rfl: 1   Multiple Vitamins-Minerals (PRESERVISION AREDS 2) CAPS, Take 1 capsule by mouth 2 (two) times a day. , Disp: , Rfl:    rivastigmine (EXELON) 1.5 MG capsule, Take 1 capsule (1.5 mg total) by mouth 2 (two) times daily., Disp: 60 capsule, Rfl: 3   rosuvastatin (CRESTOR) 20 MG tablet, Take 1 tablet by mouth once daily, Disp: 90 tablet, Rfl: 0   BINAXNOW COVID-19 AG HOME TEST KIT, , Disp: , Rfl:    lidocaine-prilocaine (EMLA) cream, Apply to affected area once (Patient not taking: Reported on 03/07/2021), Disp: 30 g, Rfl: 3   loperamide (IMODIUM) 2 MG capsule, Take 1 capsule (2 mg total) by mouth every 2 (two) hours as needed for diarrhea or loose stools. Do not exceed 75m daily. (Patient not taking: Reported on 03/07/2021), Disp: 30 capsule, Rfl: 1   ondansetron (ZOFRAN ODT) 4 MG disintegrating tablet, Take 1 tablet (4 mg total) by mouth every 8 (eight) hours as needed for nausea or vomiting. (Patient not taking: Reported on 03/07/2021), Disp: 20 tablet, Rfl: 0   pantoprazole (PROTONIX) 40 MG tablet, TAKE 1 TABLET BY MOUTH TWICE DAILY BEFORE A MEAL (Patient not taking: Reported on 03/07/2021), Disp: 180 tablet, Rfl: 0   polyethylene glycol (MIRALAX / GLYCOLAX) 17 g packet, Take 17 g by mouth daily as needed for mild constipation. (Patient not taking: Reported on 03/07/2021), Disp: 14 each, Rfl: 0 No current facility-administered medications for this visit.  Facility-Administered Medications Ordered in Other Visits:    denosumab (XGEVA) injection 120 mg, 120 mg, Subcutaneous, Q30 days, RSindy Guadeloupe MD, 120 mg at 03/05/19 1525   sodium chloride flush (NS) 0.9 % injection 10 mL, 10 mL, Intracatheter, PRN, RSindy Guadeloupe MD  Physical exam:  Vitals:   03/07/21 0952  BP: 140/80  Pulse: 74  Resp: 16   Temp: 98.6 F (37 C)  TempSrc: Oral  Weight: 181 lb 6.4 oz (82.3 kg)  Height: 5' 6"  (1.676 m)   Physical Exam Constitutional:      General: She is not in acute distress. Cardiovascular:     Rate and Rhythm: Normal rate and regular rhythm.     Heart sounds: Normal heart sounds.  Pulmonary:     Effort: Pulmonary effort is normal.     Breath sounds: Normal breath sounds.  Abdominal:     General: Bowel sounds are normal.     Palpations: Abdomen is soft.  Skin:    General: Skin is warm and dry.  Neurological:     Mental Status: She is alert and oriented to person, place, and time.     CMP Latest Ref Rng & Units 03/07/2021  Glucose 70 - 99 mg/dL 95  BUN 8 - 23 mg/dL 15  Creatinine 0.44 - 1.00 mg/dL 1.12(H)  Sodium 135 - 145 mmol/L 137  Potassium 3.5 - 5.1 mmol/L 3.9  Chloride 98 - 111 mmol/L 99  CO2 22 - 32 mmol/L 30  Calcium 8.9 - 10.3 mg/dL 9.3  Total Protein 6.5 - 8.1 g/dL 7.6  Total Bilirubin 0.3 - 1.2 mg/dL 0.3  Alkaline Phos 38 - 126 U/L 91  AST 15 - 41 U/L 32  ALT 0 - 44 U/L 19   CBC Latest Ref Rng & Units 03/07/2021  WBC 4.0 - 10.5 K/uL 5.3  Hemoglobin 12.0 - 15.0 g/dL 12.6  Hematocrit 36.0 - 46.0 % 39.6  Platelets 150 - 400 K/uL 185    No images are attached to the encounter.  NM Bone Scan Whole Body  Result Date: 03/03/2021 CLINICAL DATA:  Small cell lung cancer with bone metastases. No current bone pain or recent injury. EXAM: NUCLEAR MEDICINE WHOLE BODY BONE SCAN TECHNIQUE: Whole body anterior and posterior images were obtained approximately 3 hours after intravenous injection of radiopharmaceutical. RADIOPHARMACEUTICALS:  20.8 mCi Technetium-74mMDP IV COMPARISON:  Whole-body bone scan 11/14/2020 and 08/01/2020. CT chest, abdomen and pelvis 03/01/2021 and 11/14/2020. FINDINGS: Scattered activity within the thoracolumbar spine is stable to improved from the previous study and corresponds with spondylosis and chronic compression deformities on CT. There is no  new uptake in the spine to suggest metastatic disease. There is no suspicious focal uptake elsewhere. Focal uptake in the right maxilla is again noted, likely periodontal. No other focal calvarial uptake is seen. There is generalized calvarial uptake consistent with hyperostosis. The soft tissue uptake is within normal limits. IMPRESSION: No osseous uptake suspicious for progressive metastatic disease. There is stable to mildly improved activity in the thoracolumbar spine which is probably degenerative as correlated with recent CTs. Electronically Signed   By: WRichardean SaleM.D.   On: 03/03/2021 09:41   CT CHEST ABDOMEN PELVIS W CONTRAST  Result Date: 03/02/2021 CLINICAL DATA:  Small-cell lung cancer with bone metastases. Restaging. EXAM: CT CHEST, ABDOMEN, AND PELVIS WITH CONTRAST TECHNIQUE: Multidetector CT imaging of the chest, abdomen and pelvis was performed following the standard protocol during bolus administration of intravenous contrast. RADIATION DOSE REDUCTION: This exam was performed according to the departmental dose-optimization program which includes automated exposure control, adjustment of the mA and/or kV according to patient size and/or use of iterative reconstruction technique. CONTRAST:  1019mOMNIPAQUE IOHEXOL 300 MG/ML  SOLN COMPARISON:  11/14/2020 FINDINGS: CT CHEST FINDINGS Cardiovascular: The heart size is normal. No substantial pericardial effusion. Coronary artery calcification is evident. Mild atherosclerotic calcification is noted in the wall of the thoracic aorta. Mediastinum/Nodes: No mediastinal lymphadenopathy. There is no hilar lymphadenopathy. There is no axillary lymphadenopathy. The esophagus has normal imaging features. Lungs/Pleura: Centrilobular emphsyema noted. Tiny right upper lobe pulmonary nodules on images 46 and 57 of series 4 stable. No new suspicious nodule or mass. No focal airspace consolidation. There is no evidence of pleural effusion.  Musculoskeletal:  Stable T2 compression deformity. CT ABDOMEN PELVIS FINDINGS Hepatobiliary: No suspicious focal abnormality within the liver parenchyma. Nodular hepatic contour suggests underlying cirrhosis. Gallbladder surgically absent. No intrahepatic or extrahepatic biliary dilation. Pancreas: No focal mass lesion. No dilatation of the main duct. No intraparenchymal cyst. No peripancreatic edema. Spleen: No splenomegaly. No focal mass lesion. Adrenals/Urinary Tract: No adrenal nodule or mass. Kidneys unremarkable. Tiny low-density subcapsular lesion upper interpolar left kidney is stable, likely a cyst. No evidence for hydroureter. The urinary bladder appears normal for the degree of distention. Stomach/Bowel: Stomach is unremarkable. No gastric wall thickening. No evidence of outlet obstruction. Duodenum is normally positioned as is the ligament of Treitz. No small bowel wall thickening. No small bowel dilatation. The terminal ileum is normal. No gross colonic mass. No colonic wall thickening. Vascular/Lymphatic: There is moderate atherosclerotic calcification of the abdominal aorta without aneurysm. There is no gastrohepatic or hepatoduodenal ligament lymphadenopathy. No retroperitoneal or mesenteric lymphadenopathy. No pelvic sidewall lymphadenopathy. Reproductive: The uterus is unremarkable.  There is no adnexal mass. Other: No intraperitoneal free fluid. Musculoskeletal: Multiple compression deformities again noted, including L1 superior endplate, L2, and S1 superior endplate. Anterolisthesis of L4 on 5 is stable. IMPRESSION: 1. Stable exam. No new or progressive findings to suggest recurrent or metastatic soft tissue disease in the chest, abdomen, or pelvis. 2. Multiple thoracolumbar compression deformities, unchanged in the interval in this patient with known osseous metastatic involvement. 3. Nodular hepatic contour suggests underlying cirrhosis. 4.  Emphysema. (ICD10-J43.9) 5. Aortic Atherosclerosis (ICD10-I70.0).  Electronically Signed   By: Misty Stanley M.D.   On: 03/02/2021 08:44     Assessment and plan- Patient is a 71 y.o. female with extensive stage small cell lung cancer here for on treatment assessment prior to cycle 40 of maintenance Tecentriq  Counts okay to proceed with cycle 40 of maintenance Tecentriq today and I will see her back in 3 weeks for cycle 41.  I reviewed CT chest abdomenPelvis images as well as bone scan images independently and discussed findings with the patient which overall shows stable areas of bone metastases and no evidence of progressive disease.  Plan is to continue Tecentriq until progression or toxicity.  Patient is not receiving bisphosphonates for bone metastases as she developed significant hypocalcemia following 1 dose.  Autoimmune hypothyroidism:Last TSH was 13 but prior to that there were labs suggestive of hyperthyroidism.  Continue to monitor without any dose adjustments presently   Visit Diagnosis 1. Autoimmune hypothyroidism   2. Encounter for antineoplastic immunotherapy   3. Small cell lung cancer, right upper lobe ([place])      Dr. Randa Evens, MD, MPH [facility] at [facility] [phone] 03/07/2021 12:40 PM

## 2021-03-28 ENCOUNTER — Inpatient Hospital Stay: Payer: PPO | Admitting: Oncology

## 2021-03-28 ENCOUNTER — Inpatient Hospital Stay: Payer: PPO

## 2021-03-28 ENCOUNTER — Other Ambulatory Visit: Payer: Self-pay | Admitting: Family Medicine

## 2021-03-28 DIAGNOSIS — F331 Major depressive disorder, recurrent, moderate: Secondary | ICD-10-CM

## 2021-03-29 ENCOUNTER — Other Ambulatory Visit: Payer: Self-pay

## 2021-03-29 ENCOUNTER — Inpatient Hospital Stay: Payer: PPO

## 2021-03-29 ENCOUNTER — Inpatient Hospital Stay: Payer: PPO | Attending: Oncology

## 2021-03-29 ENCOUNTER — Inpatient Hospital Stay (HOSPITAL_BASED_OUTPATIENT_CLINIC_OR_DEPARTMENT_OTHER): Payer: PPO | Admitting: Oncology

## 2021-03-29 ENCOUNTER — Encounter: Payer: Self-pay | Admitting: Oncology

## 2021-03-29 VITALS — BP 118/75 | HR 85 | Temp 96.7°F | Resp 16 | Ht 66.0 in | Wt 194.0 lb

## 2021-03-29 DIAGNOSIS — E063 Autoimmune thyroiditis: Secondary | ICD-10-CM | POA: Diagnosis not present

## 2021-03-29 DIAGNOSIS — Z79899 Other long term (current) drug therapy: Secondary | ICD-10-CM | POA: Diagnosis not present

## 2021-03-29 DIAGNOSIS — Z5112 Encounter for antineoplastic immunotherapy: Secondary | ICD-10-CM

## 2021-03-29 DIAGNOSIS — Z8041 Family history of malignant neoplasm of ovary: Secondary | ICD-10-CM | POA: Insufficient documentation

## 2021-03-29 DIAGNOSIS — C7951 Secondary malignant neoplasm of bone: Secondary | ICD-10-CM | POA: Diagnosis not present

## 2021-03-29 DIAGNOSIS — C3411 Malignant neoplasm of upper lobe, right bronchus or lung: Secondary | ICD-10-CM | POA: Diagnosis not present

## 2021-03-29 DIAGNOSIS — Z87891 Personal history of nicotine dependence: Secondary | ICD-10-CM | POA: Diagnosis not present

## 2021-03-29 LAB — CBC WITH DIFFERENTIAL/PLATELET
Abs Immature Granulocytes: 0.02 10*3/uL (ref 0.00–0.07)
Basophils Absolute: 0.1 10*3/uL (ref 0.0–0.1)
Basophils Relative: 1 %
Eosinophils Absolute: 0.4 10*3/uL (ref 0.0–0.5)
Eosinophils Relative: 6 %
HCT: 40.2 % (ref 36.0–46.0)
Hemoglobin: 12.6 g/dL (ref 12.0–15.0)
Immature Granulocytes: 0 %
Lymphocytes Relative: 23 %
Lymphs Abs: 1.4 10*3/uL (ref 0.7–4.0)
MCH: 28.1 pg (ref 26.0–34.0)
MCHC: 31.3 g/dL (ref 30.0–36.0)
MCV: 89.7 fL (ref 80.0–100.0)
Monocytes Absolute: 0.2 10*3/uL (ref 0.1–1.0)
Monocytes Relative: 4 %
Neutro Abs: 3.9 10*3/uL (ref 1.7–7.7)
Neutrophils Relative %: 66 %
Platelets: 155 10*3/uL (ref 150–400)
RBC: 4.48 MIL/uL (ref 3.87–5.11)
RDW: 16.1 % — ABNORMAL HIGH (ref 11.5–15.5)
WBC: 5.9 10*3/uL (ref 4.0–10.5)
nRBC: 0 % (ref 0.0–0.2)

## 2021-03-29 LAB — COMPREHENSIVE METABOLIC PANEL
ALT: 27 U/L (ref 0–44)
AST: 42 U/L — ABNORMAL HIGH (ref 15–41)
Albumin: 4.1 g/dL (ref 3.5–5.0)
Alkaline Phosphatase: 114 U/L (ref 38–126)
Anion gap: 8 (ref 5–15)
BUN: 16 mg/dL (ref 8–23)
CO2: 33 mmol/L — ABNORMAL HIGH (ref 22–32)
Calcium: 9.4 mg/dL (ref 8.9–10.3)
Chloride: 96 mmol/L — ABNORMAL LOW (ref 98–111)
Creatinine, Ser: 1.18 mg/dL — ABNORMAL HIGH (ref 0.44–1.00)
GFR, Estimated: 49 mL/min — ABNORMAL LOW (ref >60)
Glucose, Bld: 114 mg/dL — ABNORMAL HIGH (ref 70–99)
Potassium: 3.4 mmol/L — ABNORMAL LOW (ref 3.5–5.1)
Sodium: 137 mmol/L (ref 135–145)
Total Bilirubin: 0.3 mg/dL (ref 0.3–1.2)
Total Protein: 7.7 g/dL (ref 6.5–8.1)

## 2021-03-29 MED ORDER — SODIUM CHLORIDE 0.9 % IV SOLN
Freq: Once | INTRAVENOUS | Status: AC
  Filled 2021-03-29: qty 250

## 2021-03-29 MED ORDER — HEPARIN SOD (PORK) LOCK FLUSH 100 UNIT/ML IV SOLN
INTRAVENOUS | Status: AC
  Filled 2021-03-29: qty 5

## 2021-03-29 MED ORDER — SODIUM CHLORIDE 0.9 % IV SOLN
1200.0000 mg | Freq: Once | INTRAVENOUS | Status: AC
  Administered 2021-03-29: 1200 mg via INTRAVENOUS
  Filled 2021-03-29: qty 20

## 2021-03-29 NOTE — Progress Notes (Signed)
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  She has been referred to [place] for findings of bone metastases and her recent MRI.  She has a prior history of 3 packs/day day smoking for over 45 years and quit smoking 5 years ago.She had a CT chest abdomen pelvis in 2018 which showed a 5 mm lung nodule in the left lower lobe.  Recently over the last 2 months patient has been having worsening back pain and was referred to orthopedics.  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  Pathologic fracture of S1.   PET scan showed 2 RUL lung nodules, hilar and mediastinal adenopathy and widespread bone mets. MRI brain negative.   Patient completed palliative RT to her spine. Bronchoscopy showed small cell lung cancer.  Palliative carboplatin, etoposide and Tecentriq started on 08/18/2018. Scans after 4 cycles showed stable disease. 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  She reports being compliant but her  values fluctuate widely.        Interval history-tolerating treatments well without any significant side effects.  Denies any cough, shortness of breath or new aches and pains anywhere.  Reports being compliant with her levothyroxine  ECOG PS- 1 Pain scale- 0   Review of systems- Review of Systems  Constitutional:  Negative for chills, fever, malaise/fatigue and weight loss.  HENT:  Negative for congestion, ear discharge and nosebleeds.   Eyes:  Negative for blurred vision.  Respiratory:  Negative for cough, hemoptysis, sputum production, shortness of breath and wheezing.   Cardiovascular:  Negative for chest pain, palpitations, orthopnea and claudication.  Gastrointestinal:  Negative for abdominal pain, blood in stool, constipation, diarrhea, heartburn, melena, nausea and vomiting.  Genitourinary:  Negative for dysuria, flank pain, frequency, hematuria and urgency.  Musculoskeletal:  Negative for back pain, joint pain and myalgias.  Skin:  Negative for rash.  Neurological:  Negative for dizziness, tingling, focal weakness, seizures, weakness and headaches.  Endo/Heme/Allergies:  Does not bruise/bleed easily.  Psychiatric/Behavioral:  Negative for depression and suicidal ideas. The patient does not have insomnia.      Allergies  Allergen Reactions   Bee Venom Swelling     Past Medical History:  Diagnosis Date   Arthritis    Cirrhosis ([place])    Depression    GERD (gastroesophageal reflux disease)    Hyperlipidemia    Hypertension    Lung cancer ([place])    Metastatic bone cancer [facility])      Past Surgical History:  Procedure Laterality Date   APPENDECTOMY  1971   CHOLECYSTECTOMY  1971   COLONOSCOPY WITH PROPOFOL N/A 11/15/2016  Procedure: COLONOSCOPY WITH PROPOFOL;  Surgeon: Jonathon Bellows, MD;  Location: [facility] ENDOSCOPY;  Service: Gastroenterology;  Laterality: N/A;   ENDOBRONCHIAL ULTRASOUND Right 07/30/2018   Procedure: ENDOBRONCHIAL ULTRASOUND;  Surgeon: Laverle Hobby, MD;  Location: [facility] ORS;  Service: Pulmonary;  Laterality: Right;   ESOPHAGOGASTRODUODENOSCOPY (EGD) WITH PROPOFOL N/A 01/07/2017   Procedure: ESOPHAGOGASTRODUODENOSCOPY (EGD) WITH PROPOFOL;  Surgeon: Jonathon Bellows, MD;  Location: [facility] ENDOSCOPY;  Service: Gastroenterology;  Laterality: N/A;   LAPAROSCOPY N/A 03/01/2017   Procedure: LAPAROSCOPY DIAGNOSTIC;  Surgeon: Robert Bellow, MD;  Location: [facility] ORS;  Service: General;  Laterality: N/A;   PORTA CATH INSERTION N/A 08/14/2018   Procedure: PORTA CATH INSERTION;  Surgeon: Algernon Huxley, MD;  Location: [place] CV LAB;  Service: Cardiovascular;  Laterality: N/A;   TONSILECTOMY, ADENOIDECTOMY, BILATERAL MYRINGOTOMY AND TUBES  1955   TONSILLECTOMY     VENTRAL HERNIA REPAIR N/A 03/01/2017   10 x 14 CM Ventralight ST mesh, intraperitoneal location.    VENTRAL HERNIA REPAIR N/A 03/01/2017   Procedure: HERNIA REPAIR VENTRAL ADULT;  Surgeon: Robert Bellow, MD;  Location: [facility] ORS;  Service: General;  Laterality: N/A;    Social History   Socioeconomic History   Marital status: Married    Spouse name: Johnny   Number of children: Not on file   Years of education: Not on file   Highest education level: Not on file  Occupational History   Not on file  Tobacco Use   Smoking status: Former    Packs/day: 2.00    Years: 50.00    Pack years: 100.00    Types: Cigarettes, E-cigarettes    Quit date: 02/07/2012    Years since quitting: 9.1   Smokeless tobacco: Never  Vaping Use   Vaping Use: Some days   Start date: 10/06/2013   Devices: uses no liquid  Substance and Sexual Activity   Alcohol use: No    Alcohol/week: 0.0 standard drinks   Drug use: No   Sexual activity: Yes  Other Topics Concern   Not on file  Social History Narrative   Married   Retired   Clinical cytogeneticist level of education    No children    1 cup of coffee   Social Determinants of Radio broadcast assistant Strain: Low Risk    Difficulty of Paying  Living Expenses: Not hard at all  Food Insecurity: No Food Insecurity   Worried About Charity fundraiser in the Last Year: Never true   Arboriculturist in the Last Year: Never true  Transportation Needs: No Transportation Needs   Lack of Transportation (Medical): No   Lack of Transportation (Non-Medical): No  Physical Activity: Not on file  Stress: No Stress Concern Present   Feeling of Stress : Not at all  Social Connections: Unknown   Frequency of Communication with Friends and Family: More than three times a week   Frequency of Social Gatherings with Friends and Family: Not on file   Attends Religious Services: Not on file   Active Member of Clubs or Organizations: Not on file   Attends Archivist Meetings: Not on file   Marital Status: Married  Human resources officer Violence: Not At Risk   Fear of Current or Ex-Partner: No   Emotionally Abused: No   Physically Abused: No   Sexually Abused: No    Family History  Problem Relation Age of Onset   Hypertension Mother    Ovarian cancer Mother 12  Heart disease Father    Stroke Father    Ovarian cancer Sister        ? dx cancer had hyst.   Breast cancer Neg Hx      Current Outpatient Medications:    amLODipine (NORVASC) 5 MG tablet, Take 1 tablet (5 mg total) by mouth daily., Disp: 90 tablet, Rfl: 3   buPROPion (WELLBUTRIN XL) 300 MG 24 hr tablet, Take 1 tablet (300 mg total) by mouth daily., Disp: 90 tablet, Rfl: 1   clobetasol cream (TEMOVATE) 3.79 %, Apply 1 application topically 2 (two) times daily. Lower back, Disp: 60 g, Rfl: 1   erythromycin ophthalmic ointment, Place 1 application into the right eye daily as needed., Disp: , Rfl:    escitalopram (LEXAPRO) 20 MG tablet, Take 1 tablet (20 mg total) by mouth daily., Disp: 90 tablet, Rfl: 1   fluticasone (FLONASE) 50 MCG/ACT nasal spray, Place 2 sprays into both nostrils daily. (Patient taking differently: Place 2 sprays into both nostrils daily as needed.), Disp: 16  g, Rfl: 6   folic acid (FOLVITE) 1 MG tablet, Take 1 tablet by mouth once daily, Disp: 90 tablet, Rfl: 0   ibuprofen (ADVIL,MOTRIN) 200 MG tablet, Take 400 mg by mouth every 8 (eight) hours as needed for headache or moderate pain. , Disp: , Rfl:    levothyroxine (SYNTHROID) 100 MCG tablet, Take 1 tablet (100 mcg total) by mouth daily before breakfast., Disp: 30 tablet, Rfl: 1   lidocaine-prilocaine (EMLA) cream, Apply to affected area once, Disp: 30 g, Rfl: 3   Multiple Vitamins-Minerals (PRESERVISION AREDS 2) CAPS, Take 1 capsule by mouth 2 (two) times a day. , Disp: , Rfl:    ondansetron (ZOFRAN ODT) 4 MG disintegrating tablet, Take 1 tablet (4 mg total) by mouth every 8 (eight) hours as needed for nausea or vomiting., Disp: 20 tablet, Rfl: 0   pantoprazole (PROTONIX) 40 MG tablet, TAKE 1 TABLET BY MOUTH TWICE DAILY BEFORE A MEAL, Disp: 180 tablet, Rfl: 0   rosuvastatin (CRESTOR) 20 MG tablet, Take 1 tablet by mouth once daily, Disp: 90 tablet, Rfl: 0   BINAXNOW COVID-19 AG HOME TEST KIT, , Disp: , Rfl:    loperamide (IMODIUM) 2 MG capsule, Take 1 capsule (2 mg total) by mouth every 2 (two) hours as needed for diarrhea or loose stools. Do not exceed 68m daily. (Patient not taking: Reported on 03/07/2021), Disp: 30 capsule, Rfl: 1   polyethylene glycol (MIRALAX / GLYCOLAX) 17 g packet, Take 17 g by mouth daily as needed for mild constipation. (Patient not taking: Reported on 03/29/2021), Disp: 14 each, Rfl: 0   rivastigmine (EXELON) 1.5 MG capsule, Take 1 capsule (1.5 mg total) by mouth 2 (two) times daily., Disp: 60 capsule, Rfl: 3 No current facility-administered medications for this visit.  Facility-Administered Medications Ordered in Other Visits:    denosumab (XGEVA) injection 120 mg, 120 mg, Subcutaneous, Q30 days, RSindy Guadeloupe MD, 120 mg at 03/05/19 1525  Physical exam:  Vitals:   03/29/21 1014  BP: 118/75  Pulse: 85  Resp: 16  Temp: (!) 96.7 F (35.9 C)  TempSrc: Tympanic  SpO2:  93%  Weight: 194 lb (88 kg)  Height: _0  (1.676 m)   Physical Exam Constitutional:      General: She is not in acute distress. Cardiovascular:     Rate and Rhythm: Normal rate and regular rhythm.     Heart sounds: Normal heart sounds.  Pulmonary:     Effort:  Pulmonary effort is normal.     Breath sounds: Normal breath sounds.  Abdominal:     General: Bowel sounds are normal.     Palpations: Abdomen is soft.  Skin:    General: Skin is warm and dry.  Neurological:     Mental Status: She is alert and oriented to person, place, and time.     CMP Latest Ref Rng & Units 03/29/2021  Glucose 70 - 99 mg/dL 114(H)  BUN 8 - 23 mg/dL 16  Creatinine 0.44 - 1.00 mg/dL 1.18(H)  Sodium 135 - 145 mmol/L 137  Potassium 3.5 - 5.1 mmol/L 3.4(L)  Chloride 98 - 111 mmol/L 96(L)  CO2 22 - 32 mmol/L 33(H)  Calcium 8.9 - 10.3 mg/dL 9.4  Total Protein 6.5 - 8.1 g/dL 7.7  Total Bilirubin 0.3 - 1.2 mg/dL 0.3  Alkaline Phos 38 - 126 U/L 114  AST 15 - 41 U/L 42(H)  ALT 0 - 44 U/L 27   CBC Latest Ref Rng & Units 03/29/2021  WBC 4.0 - 10.5 K/uL 5.9  Hemoglobin 12.0 - 15.0 g/dL 12.6  Hematocrit 36.0 - 46.0 % 40.2  Platelets 150 - 400 K/uL 155    No images are attached to the encounter.  NM Bone Scan Whole Body  Result Date: 03/03/2021 CLINICAL DATA:  Small cell lung cancer with bone metastases. No current bone pain or recent injury. EXAM: NUCLEAR MEDICINE WHOLE BODY BONE SCAN TECHNIQUE: Whole body anterior and posterior images were obtained approximately 3 hours after intravenous injection of radiopharmaceutical. RADIOPHARMACEUTICALS:  20.8 mCi Technetium-56mMDP IV COMPARISON:  Whole-body bone scan 11/14/2020 and 08/01/2020. CT chest, abdomen and pelvis 03/01/2021 and 11/14/2020. FINDINGS: Scattered activity within the thoracolumbar spine is stable to improved from the previous study and corresponds with spondylosis and chronic compression deformities on CT. There is no new uptake in the spine to  suggest metastatic disease. There is no suspicious focal uptake elsewhere. Focal uptake in the right maxilla is again noted, likely periodontal. No other focal calvarial uptake is seen. There is generalized calvarial uptake consistent with hyperostosis. The soft tissue uptake is within normal limits. IMPRESSION: No osseous uptake suspicious for progressive metastatic disease. There is stable to mildly improved activity in the thoracolumbar spine which is probably degenerative as correlated with recent CTs. Electronically Signed   By: WRichardean SaleM.D.   On: 03/03/2021 09:41   CT CHEST ABDOMEN PELVIS W CONTRAST  Result Date: 03/02/2021 CLINICAL DATA:  Small-cell lung cancer with bone metastases. Restaging. EXAM: CT CHEST, ABDOMEN, AND PELVIS WITH CONTRAST TECHNIQUE: Multidetector CT imaging of the chest, abdomen and pelvis was performed following the standard protocol during bolus administration of intravenous contrast. RADIATION DOSE REDUCTION: This exam was performed according to the departmental dose-optimization program which includes automated exposure control, adjustment of the mA and/or kV according to patient size and/or use of iterative reconstruction technique. CONTRAST:  1040mOMNIPAQUE IOHEXOL 300 MG/ML  SOLN COMPARISON:  11/14/2020 FINDINGS: CT CHEST FINDINGS Cardiovascular: The heart size is normal. No substantial pericardial effusion. Coronary artery calcification is evident. Mild atherosclerotic calcification is noted in the wall of the thoracic aorta. Mediastinum/Nodes: No mediastinal lymphadenopathy. There is no hilar lymphadenopathy. There is no axillary lymphadenopathy. The esophagus has normal imaging features. Lungs/Pleura: Centrilobular emphsyema noted. Tiny right upper lobe pulmonary nodules on images 46 and 57 of series 4 stable. No new suspicious nodule or mass. No focal airspace consolidation. There is no evidence of pleural effusion. Musculoskeletal: Stable T2 compression deformity.  CT ABDOMEN PELVIS FINDINGS Hepatobiliary: No suspicious focal abnormality within the liver parenchyma. Nodular hepatic contour suggests underlying cirrhosis. Gallbladder surgically absent. No intrahepatic or extrahepatic biliary dilation. Pancreas: No focal mass lesion. No dilatation of the main duct. No intraparenchymal cyst. No peripancreatic edema. Spleen: No splenomegaly. No focal mass lesion. Adrenals/Urinary Tract: No adrenal nodule or mass. Kidneys unremarkable. Tiny low-density subcapsular lesion upper interpolar left kidney is stable, likely a cyst. No evidence for hydroureter. The urinary bladder appears normal for the degree of distention. Stomach/Bowel: Stomach is unremarkable. No gastric wall thickening. No evidence of outlet obstruction. Duodenum is normally positioned as is the ligament of Treitz. No small bowel wall thickening. No small bowel dilatation. The terminal ileum is normal. No gross colonic mass. No colonic wall thickening. Vascular/Lymphatic: There is moderate atherosclerotic calcification of the abdominal aorta without aneurysm. There is no gastrohepatic or hepatoduodenal ligament lymphadenopathy. No retroperitoneal or mesenteric lymphadenopathy. No pelvic sidewall lymphadenopathy. Reproductive: The uterus is unremarkable.  There is no adnexal mass. Other: No intraperitoneal free fluid. Musculoskeletal: Multiple compression deformities again noted, including L1 superior endplate, L2, and S1 superior endplate. Anterolisthesis of L4 on 5 is stable. IMPRESSION: 1. Stable exam. No new or progressive findings to suggest recurrent or metastatic soft tissue disease in the chest, abdomen, or pelvis. 2. Multiple thoracolumbar compression deformities, unchanged in the interval in this patient with known osseous metastatic involvement. 3. Nodular hepatic contour suggests underlying cirrhosis. 4.  Emphysema. (ICD10-J43.9) 5. Aortic Atherosclerosis (ICD10-I70.0). Electronically Signed   By: Misty Stanley M.D.   On: 03/02/2021 08:44     Assessment and plan- Patient is a 71 y.o. female with extensive stage small cell lung cancer here for on treatment assessment prior to cycle 41 of maintenance Tecentriq  Counts okay to proceed with cycle 41 of maintenance Tecentriq today.  I will see her back in 3 weeks for cycle 42.  Plan is to continue immunotherapy until progression or toxicity.  Autoimmune hypothyroidism:Continue levothyroxine 100 mcg daily with repeat TSH in 3 weeks times   Visit Diagnosis 1. Small cell lung cancer, right upper lobe ([place])   2. Encounter for antineoplastic immunotherapy      Dr. Randa Evens, MD, MPH [facility] at [facility] [phone] 03/29/2021 1:34 PM

## 2021-03-29 NOTE — Patient Instructions (Signed)
[facility] CANCER CTR AT [place]  Discharge Instructions: Thank you for choosing [facility] to provide your oncology and hematology care.  If you have a lab appointment with the [facility], please go directly to the [facility] and check in at the registration area.  Wear comfortable clothing and clothing appropriate for easy access to any Portacath or PICC line.   We strive to give you quality time with your provider. You may need to reschedule your appointment if you arrive late (15 or more minutes).  Arriving late affects you and other patients whose appointments are after yours.  Also, if you miss three or more appointments without notifying the office, you may be dismissed from the clinic at the providers discretion.      For prescription refill requests, have your pharmacy contact our office and allow 72 hours for refills to be completed.    Today you received the following chemotherapy and/or immunotherapy agents : Tecentriq    To help prevent nausea and vomiting after your treatment, we encourage you to take your nausea medication as directed.  BELOW ARE SYMPTOMS THAT SHOULD BE REPORTED IMMEDIATELY: *FEVER GREATER THAN 100.4 F (38 C) OR HIGHER *CHILLS OR SWEATING *NAUSEA AND VOMITING THAT IS NOT CONTROLLED WITH YOUR NAUSEA MEDICATION *UNUSUAL SHORTNESS OF BREATH *UNUSUAL BRUISING OR BLEEDING *URINARY PROBLEMS (pain or burning when urinating, or frequent urination) *BOWEL PROBLEMS (unusual diarrhea, constipation, pain near the anus) TENDERNESS IN MOUTH AND THROAT WITH OR WITHOUT PRESENCE OF ULCERS (sore throat, sores in mouth, or a toothache) UNUSUAL RASH, SWELLING OR PAIN  UNUSUAL VAGINAL DISCHARGE OR ITCHING   Items with * indicate a potential emergency and should be followed up as soon as possible or go to the Emergency Department if any problems should occur.  Please show the CHEMOTHERAPY ALERT CARD or IMMUNOTHERAPY ALERT CARD at check-in to  the Emergency Department and triage nurse.  Should you have questions after your visit or need to cancel or reschedule your appointment, please contact [facility] CANCER [place] AT [place]  [phone] and follow the prompts.  Office hours are 8:00 a.m. to 4:30 p.m. Monday - Friday. Please note that voicemails left after 4:00 p.m. may not be returned until the following business day.  We are closed weekends and major holidays. You have access to a nurse at all times for urgent questions. Please call the main number to the clinic [phone] and follow the prompts.  For any non-urgent questions, you may also contact your provider using MyChart. We now offer e-Visits for anyone 14 and older to request care online for non-urgent symptoms. For details visit [email or web address].   Also download the MyChart app! Go to the app store, search "MyChart", open the app, select [place], and log in with your MyChart username and password.  Due to Covid, a mask is required upon entering the hospital/clinic. If you do not have a mask, one will be given to you upon arrival. For doctor visits, patients may have 1 support person aged 85 or older with them. For treatment visits, patients cannot have anyone with them due to current Covid guidelines and our immunocompromised population.

## 2021-04-06 DIAGNOSIS — G3184 Mild cognitive impairment, so stated: Secondary | ICD-10-CM | POA: Diagnosis not present

## 2021-04-06 DIAGNOSIS — Z515 Encounter for palliative care: Secondary | ICD-10-CM | POA: Diagnosis not present

## 2021-04-06 DIAGNOSIS — J449 Chronic obstructive pulmonary disease, unspecified: Secondary | ICD-10-CM | POA: Diagnosis not present

## 2021-04-06 DIAGNOSIS — F331 Major depressive disorder, recurrent, moderate: Secondary | ICD-10-CM | POA: Diagnosis not present

## 2021-04-06 DIAGNOSIS — H353290 Exudative age-related macular degeneration, unspecified eye, stage unspecified: Secondary | ICD-10-CM | POA: Diagnosis not present

## 2021-04-06 DIAGNOSIS — C3490 Malignant neoplasm of unspecified part of unspecified bronchus or lung: Secondary | ICD-10-CM | POA: Diagnosis not present

## 2021-04-06 DIAGNOSIS — Z87891 Personal history of nicotine dependence: Secondary | ICD-10-CM | POA: Diagnosis not present

## 2021-04-06 DIAGNOSIS — I739 Peripheral vascular disease, unspecified: Secondary | ICD-10-CM | POA: Diagnosis not present

## 2021-04-06 DIAGNOSIS — C7951 Secondary malignant neoplasm of bone: Secondary | ICD-10-CM | POA: Diagnosis not present

## 2021-04-19 ENCOUNTER — Inpatient Hospital Stay: Payer: PPO

## 2021-04-19 ENCOUNTER — Inpatient Hospital Stay (HOSPITAL_BASED_OUTPATIENT_CLINIC_OR_DEPARTMENT_OTHER): Payer: PPO | Admitting: Oncology

## 2021-04-19 ENCOUNTER — Encounter: Payer: Self-pay | Admitting: Oncology

## 2021-04-19 ENCOUNTER — Inpatient Hospital Stay: Payer: PPO | Attending: Oncology

## 2021-04-19 ENCOUNTER — Other Ambulatory Visit: Payer: Self-pay

## 2021-04-19 VITALS — BP 151/94 | HR 72 | Temp 98.0°F | Resp 18 | Wt 186.5 lb

## 2021-04-19 DIAGNOSIS — C3411 Malignant neoplasm of upper lobe, right bronchus or lung: Secondary | ICD-10-CM | POA: Insufficient documentation

## 2021-04-19 DIAGNOSIS — Z5112 Encounter for antineoplastic immunotherapy: Secondary | ICD-10-CM | POA: Insufficient documentation

## 2021-04-19 DIAGNOSIS — C7951 Secondary malignant neoplasm of bone: Secondary | ICD-10-CM | POA: Diagnosis not present

## 2021-04-19 DIAGNOSIS — R7989 Other specified abnormal findings of blood chemistry: Secondary | ICD-10-CM

## 2021-04-19 DIAGNOSIS — Z87891 Personal history of nicotine dependence: Secondary | ICD-10-CM | POA: Diagnosis not present

## 2021-04-19 DIAGNOSIS — R945 Abnormal results of liver function studies: Secondary | ICD-10-CM | POA: Insufficient documentation

## 2021-04-19 DIAGNOSIS — Z8041 Family history of malignant neoplasm of ovary: Secondary | ICD-10-CM | POA: Insufficient documentation

## 2021-04-19 DIAGNOSIS — Z95828 Presence of other vascular implants and grafts: Secondary | ICD-10-CM

## 2021-04-19 LAB — CBC WITH DIFFERENTIAL/PLATELET
Abs Immature Granulocytes: 0.02 10*3/uL (ref 0.00–0.07)
Basophils Absolute: 0.1 10*3/uL (ref 0.0–0.1)
Basophils Relative: 1 %
Eosinophils Absolute: 0.2 10*3/uL (ref 0.0–0.5)
Eosinophils Relative: 4 %
HCT: 42.4 % (ref 36.0–46.0)
Hemoglobin: 13.2 g/dL (ref 12.0–15.0)
Immature Granulocytes: 0 %
Lymphocytes Relative: 23 %
Lymphs Abs: 1.4 10*3/uL (ref 0.7–4.0)
MCH: 27.8 pg (ref 26.0–34.0)
MCHC: 31.1 g/dL (ref 30.0–36.0)
MCV: 89.3 fL (ref 80.0–100.0)
Monocytes Absolute: 0.3 10*3/uL (ref 0.1–1.0)
Monocytes Relative: 5 %
Neutro Abs: 3.9 10*3/uL (ref 1.7–7.7)
Neutrophils Relative %: 67 %
Platelets: 182 10*3/uL (ref 150–400)
RBC: 4.75 MIL/uL (ref 3.87–5.11)
RDW: 16.2 % — ABNORMAL HIGH (ref 11.5–15.5)
WBC: 5.8 10*3/uL (ref 4.0–10.5)
nRBC: 0 % (ref 0.0–0.2)

## 2021-04-19 LAB — COMPREHENSIVE METABOLIC PANEL
ALT: 63 U/L — ABNORMAL HIGH (ref 0–44)
AST: 81 U/L — ABNORMAL HIGH (ref 15–41)
Albumin: 4.2 g/dL (ref 3.5–5.0)
Alkaline Phosphatase: 112 U/L (ref 38–126)
Anion gap: 9 (ref 5–15)
BUN: 15 mg/dL (ref 8–23)
CO2: 31 mmol/L (ref 22–32)
Calcium: 9.2 mg/dL (ref 8.9–10.3)
Chloride: 97 mmol/L — ABNORMAL LOW (ref 98–111)
Creatinine, Ser: 1.24 mg/dL — ABNORMAL HIGH (ref 0.44–1.00)
GFR, Estimated: 46 mL/min — ABNORMAL LOW (ref >60)
Glucose, Bld: 95 mg/dL (ref 70–99)
Potassium: 3.2 mmol/L — ABNORMAL LOW (ref 3.5–5.1)
Sodium: 137 mmol/L (ref 135–145)
Total Bilirubin: 0.5 mg/dL (ref 0.3–1.2)
Total Protein: 7.8 g/dL (ref 6.5–8.1)

## 2021-04-19 MED ORDER — HEPARIN SOD (PORK) LOCK FLUSH 100 UNIT/ML IV SOLN
500.0000 [IU] | Freq: Once | INTRAVENOUS | Status: AC
  Administered 2021-04-19: 500 [IU] via INTRAVENOUS
  Filled 2021-04-19: qty 5

## 2021-04-19 MED ORDER — HEPARIN SOD (PORK) LOCK FLUSH 100 UNIT/ML IV SOLN
INTRAVENOUS | Status: AC
  Filled 2021-04-19: qty 5

## 2021-04-19 NOTE — Progress Notes (Signed)
Labs reviewed with Sonia Baller, NP. Per NP to hold treatment today and reschedule pt to next week for labs and possible treatment. Pt updated and all questions answered at this time, agrees with plan. VSS. Pt stable at discharge. ? ?Alanson Puls, RN  ?

## 2021-04-19 NOTE — Progress Notes (Signed)
? ? ? ?Hematology/Oncology Consult note ?[place]  ?[phone] [phone] ? ?Patient Care Team: ?Leone Haven, MD as PCP - General (Family Medicine) ?Leone Haven, MD as Consulting Physician (Family Medicine) ?Robert Bellow, MD (General Surgery) ?Telford Nab, RN as Equities trader ?Sindy Guadeloupe, MD as Consulting Physician (Hematology and Oncology)  ? ?Name of the patient: Anne [name]  ?[id number]  ?Aug 10, 1950  ? ?Date of visit: 04/19/21 ? 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 ?Chief complaint/ Reason for visit-on treatment assessment prior to cycle 41 of maintenance Tecentriq ? ?Heme/Onc history: patient is a 71 year old female with a past medical history significant for hypertension hyperlipidemia obesity and cirrhosis of the liver among other medical problems.  She has been referred to [place] for findings of bone metastases and her recent MRI.  She has a prior history of 3 packs/day day smoking for over 45 years and quit smoking 5 years ago.She had a CT chest abdomen pelvis in 2018 which showed a 5 mm lung nodule in the left lower lobe.  Recently over the last 2 months patient has been having worsening back pain and was referred to orthopedics.  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  Pathologic fracture of S1. ?  ?PET scan showed 2 RUL lung nodules, hilar and mediastinal adenopathy and widespread bone mets. MRI brain negative. ?  ?Patient completed palliative RT to her spine. Bronchoscopy showed small cell lung cancer.  Palliative carboplatin, etoposide and Tecentriq started on 08/18/2018. Scans after 4 cycles showed stable disease. 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  She reports being compliant but her  values fluctuate widely. ? ?  ?Interval history-continues to tolerate treatments well.  Denies any new aches or pains.  Breathing is nonlabored.  Continues levothyroxine.  Reports age-appropriate fatigue. ? ?ECOG PS- 1 ?Pain scale- 0 ? ? ?Review of systems- Review of Systems  ?Constitutional:  Positive for malaise/fatigue. Negative for chills, fever and weight loss.  ?HENT:  Negative for congestion, ear pain and tinnitus.   ?Eyes: Negative.  Negative for blurred vision and double vision.  ?Respiratory: Negative.  Negative for cough, sputum production and shortness of breath.   ?Cardiovascular: Negative.  Negative for chest pain, palpitations and leg swelling.  ?Gastrointestinal: Negative.  Negative for abdominal pain, constipation, diarrhea, nausea and vomiting.  ?Genitourinary:  Negative for dysuria, frequency and urgency.  ?Musculoskeletal:  Negative for back pain and falls.  ?Skin: Negative.  Negative for rash.  ?Neurological: Negative.  Negative for weakness and headaches.  ?Endo/Heme/Allergies: Negative.  Does not bruise/bleed easily.  ?Psychiatric/Behavioral: Negative.  Negative for depression. The patient is not nervous/anxious and does not have insomnia.    ? ? ?Allergies  ?Allergen Reactions  ? Bee Venom Swelling  ? ? ? ?Past Medical History:  ?Diagnosis Date  ? Arthritis   ? Cirrhosis ([place])   ? Depression   ? GERD (gastroesophageal reflux disease)   ? Hyperlipidemia   ? Hypertension   ? Lung cancer ([place])   ? Metastatic bone cancer ([place])   ? ? ? ?Past Surgical History:  ?Procedure Laterality Date  ? APPENDECTOMY  1971  ? CHOLECYSTECTOMY  1971  ? COLONOSCOPY WITH PROPOFOL N/A 11/15/2016  ? Procedure: COLONOSCOPY WITH PROPOFOL;  Surgeon: Jonathon Bellows, MD;  Location: [facility] ENDOSCOPY;  Service: Gastroenterology;  Laterality: N/A;  ? ENDOBRONCHIAL ULTRASOUND Right 07/30/2018  ? Procedure: ENDOBRONCHIAL ULTRASOUND;  Surgeon: Laverle Hobby, MD;  Location: [facility] ORS;  Service: Pulmonary;  Laterality: Right;  ?  ESOPHAGOGASTRODUODENOSCOPY (EGD) WITH PROPOFOL N/A 01/07/2017  ? Procedure: ESOPHAGOGASTRODUODENOSCOPY (EGD) WITH PROPOFOL;  Surgeon: Jonathon Bellows, MD;  Location: [facility] ENDOSCOPY;  Service: Gastroenterology;  Laterality: N/A;  ? LAPAROSCOPY N/A 03/01/2017  ? Procedure: LAPAROSCOPY DIAGNOSTIC;  Surgeon: Robert Bellow, MD;  Location: [facility] ORS;  Service: General;  Laterality: N/A;  ? PORTA CATH INSERTION N/A 08/14/2018  ? Procedure: PORTA CATH INSERTION;  Surgeon: Algernon Huxley, MD;  Location: [place] CV LAB;  Service: Cardiovascular;  Laterality: N/A;  ? TONSILECTOMY, ADENOIDECTOMY, BILATERAL [place]  ? TONSILLECTOMY    ? VENTRAL HERNIA REPAIR N/A 03/01/2017  ? 10 x 14 CM Ventralight ST mesh, intraperitoneal location.   ? VENTRAL HERNIA REPAIR N/A 03/01/2017  ? Procedure: HERNIA REPAIR VENTRAL ADULT;  Surgeon: Robert Bellow, MD;  Location: [facility] ORS;  Service: General;  Laterality: N/A;  ? ? ?Social History  ? ?Socioeconomic History  ? Marital status: Married  ?  Spouse name: [name]  ? Number of children: Not on file  ? Years of education: Not on file  ? Highest education level: Not on file  ?Occupational History  ? Not on file  ?Tobacco Use  ? Smoking status: Former  ?  Packs/day: 2.00  ?  Years: 50.00  ?  Pack years: 100.00  ?  Types: Cigarettes, E-cigarettes  ?  Quit date: 02/07/2012  ?  Years since quitting: 9.2  ? Smokeless tobacco: Never  ?Vaping Use  ? Vaping Use: Some days  ? Start date: 10/06/2013  ? Devices: uses no liquid  ?Substance and Sexual Activity  ? Alcohol use: No  ?  Alcohol/week: 0.0 standard drinks  ? Drug use: No  ? Sexual activity: Yes  ?Other Topics Concern  ? Not on file  ?Social History Narrative  ? Married  ? Retired  ? High School-highest level of education   ? No children   ? 1 cup of coffee  ? ?Social Determinants of Health  ? ?Financial Resource Strain: Low Risk   ? Difficulty of Paying Living Expenses: Not hard at all  ?Food Insecurity: No Food Insecurity  ?  Worried About Charity fundraiser in the Last Year: Never true  ? Ran Out of Food in the Last Year: Never true  ?Transportation Needs: No Transportation Needs  ? Lack of Transportation (Medical): No  ? Lack of Transportation (Non-Medical): No  ?Physical Activity: Not on file  ?Stress: No Stress Concern Present  ? Feeling of Stress : Not at all  ?Social Connections: Unknown  ? Frequency of Communication with Friends and Family: More than three times a week  ? Frequency of Social Gatherings with Friends and Family: Not on file  ? Attends Religious Services: Not on file  ? Active Member of Clubs or Organizations: Not on file  ? Attends Archivist Meetings: Not on file  ? Marital Status: Married  ?Intimate Partner Violence: Not At Risk  ? Fear of Current or Ex-Partner: No  ? Emotionally Abused: No  ? Physically Abused: No  ? Sexually Abused: No  ? ? ?Family History  ?Problem Relation Age of Onset  ? Hypertension Mother   ? Ovarian cancer Mother 76  ? Heart disease Father   ? Stroke Father   ?  Ovarian cancer Sister   ?     ? dx cancer had hyst.  ? Breast cancer Neg Hx   ? ? ? ?Current Outpatient Medications:  ?  amLODipine (NORVASC) 5 MG tablet, Take 1 tablet (5 mg total) by mouth daily., Disp: 90 tablet, Rfl: 3 ?  buPROPion (WELLBUTRIN XL) 300 MG 24 hr tablet, Take 1 tablet (300 mg total) by mouth daily., Disp: 90 tablet, Rfl: 1 ?  clobetasol cream (TEMOVATE) 0.25 %, Apply 1 application topically 2 (two) times daily. Lower back, Disp: 60 g, Rfl: 1 ?  erythromycin ophthalmic ointment, Place 1 application into the right eye daily as needed., Disp: , Rfl:  ?  escitalopram (LEXAPRO) 20 MG tablet, Take 1 tablet by mouth once daily, Disp: 90 tablet, Rfl: 0 ?  folic acid (FOLVITE) 1 MG tablet, Take 1 tablet by mouth once daily, Disp: 90 tablet, Rfl: 0 ?  ibuprofen (ADVIL,MOTRIN) 200 MG tablet, Take 400 mg by mouth every 8 (eight) hours as needed for headache or moderate pain. , Disp: , Rfl:  ?  levothyroxine  (SYNTHROID) 100 MCG tablet, Take 1 tablet (100 mcg total) by mouth daily before breakfast., Disp: 30 tablet, Rfl: 1 ?  lidocaine-prilocaine (EMLA) cream, Apply to affected area once, Disp: 30 g, Rfl: 3 ?  loperamide (

## 2021-04-24 ENCOUNTER — Ambulatory Visit: Payer: PPO | Admitting: Family Medicine

## 2021-04-26 ENCOUNTER — Inpatient Hospital Stay: Payer: PPO

## 2021-04-26 ENCOUNTER — Other Ambulatory Visit: Payer: Self-pay | Admitting: *Deleted

## 2021-04-26 ENCOUNTER — Ambulatory Visit (INDEPENDENT_AMBULATORY_CARE_PROVIDER_SITE_OTHER): Payer: PPO | Admitting: Family Medicine

## 2021-04-26 ENCOUNTER — Other Ambulatory Visit: Payer: Self-pay

## 2021-04-26 ENCOUNTER — Encounter: Payer: Self-pay | Admitting: Family Medicine

## 2021-04-26 VITALS — BP 149/100 | HR 73 | Temp 97.9°F | Resp 18 | Ht 66.0 in | Wt 185.3 lb

## 2021-04-26 DIAGNOSIS — R4189 Other symptoms and signs involving cognitive functions and awareness: Secondary | ICD-10-CM | POA: Diagnosis not present

## 2021-04-26 DIAGNOSIS — Z5112 Encounter for antineoplastic immunotherapy: Secondary | ICD-10-CM | POA: Diagnosis not present

## 2021-04-26 DIAGNOSIS — R7989 Other specified abnormal findings of blood chemistry: Secondary | ICD-10-CM

## 2021-04-26 DIAGNOSIS — E876 Hypokalemia: Secondary | ICD-10-CM

## 2021-04-26 DIAGNOSIS — C3411 Malignant neoplasm of upper lobe, right bronchus or lung: Secondary | ICD-10-CM

## 2021-04-26 DIAGNOSIS — I10 Essential (primary) hypertension: Secondary | ICD-10-CM | POA: Diagnosis not present

## 2021-04-26 DIAGNOSIS — F331 Major depressive disorder, recurrent, moderate: Secondary | ICD-10-CM

## 2021-04-26 LAB — CBC WITH DIFFERENTIAL/PLATELET
Abs Immature Granulocytes: 0.02 10*3/uL (ref 0.00–0.07)
Basophils Absolute: 0.1 10*3/uL (ref 0.0–0.1)
Basophils Relative: 1 %
Eosinophils Absolute: 0.2 10*3/uL (ref 0.0–0.5)
Eosinophils Relative: 4 %
HCT: 42.5 % (ref 36.0–46.0)
Hemoglobin: 13.5 g/dL (ref 12.0–15.0)
Immature Granulocytes: 0 %
Lymphocytes Relative: 28 %
Lymphs Abs: 1.7 10*3/uL (ref 0.7–4.0)
MCH: 28 pg (ref 26.0–34.0)
MCHC: 31.8 g/dL (ref 30.0–36.0)
MCV: 88.2 fL (ref 80.0–100.0)
Monocytes Absolute: 0.3 10*3/uL (ref 0.1–1.0)
Monocytes Relative: 5 %
Neutro Abs: 3.8 10*3/uL (ref 1.7–7.7)
Neutrophils Relative %: 62 %
Platelets: 184 10*3/uL (ref 150–400)
RBC: 4.82 MIL/uL (ref 3.87–5.11)
RDW: 16.6 % — ABNORMAL HIGH (ref 11.5–15.5)
WBC: 6.2 10*3/uL (ref 4.0–10.5)
nRBC: 0 % (ref 0.0–0.2)

## 2021-04-26 LAB — COMPREHENSIVE METABOLIC PANEL
ALT: 71 U/L — ABNORMAL HIGH (ref 0–44)
AST: 94 U/L — ABNORMAL HIGH (ref 15–41)
Albumin: 4.4 g/dL (ref 3.5–5.0)
Alkaline Phosphatase: 122 U/L (ref 38–126)
Anion gap: 10 (ref 5–15)
BUN: 12 mg/dL (ref 8–23)
CO2: 31 mmol/L (ref 22–32)
Calcium: 9.3 mg/dL (ref 8.9–10.3)
Chloride: 94 mmol/L — ABNORMAL LOW (ref 98–111)
Creatinine, Ser: 1.09 mg/dL — ABNORMAL HIGH (ref 0.44–1.00)
GFR, Estimated: 54 mL/min — ABNORMAL LOW (ref >60)
Glucose, Bld: 94 mg/dL (ref 70–99)
Potassium: 3.6 mmol/L (ref 3.5–5.1)
Sodium: 135 mmol/L (ref 135–145)
Total Bilirubin: 0.3 mg/dL (ref 0.3–1.2)
Total Protein: 8.1 g/dL (ref 6.5–8.1)

## 2021-04-26 MED ORDER — SERTRALINE HCL 50 MG PO TABS: ORAL_TABLET | ORAL | 1 refills | Status: DC

## 2021-04-26 MED ORDER — ESCITALOPRAM OXALATE 20 MG PO TABS: 10.0000 mg | ORAL_TABLET | Freq: Every day | ORAL | Status: DC

## 2021-04-26 MED ORDER — SODIUM CHLORIDE 0.9 % IV SOLN
1200.0000 mg | Freq: Once | INTRAVENOUS | Status: AC
  Administered 2021-04-26: 1200 mg via INTRAVENOUS
  Filled 2021-04-26: qty 20

## 2021-04-26 MED ORDER — HEPARIN SOD (PORK) LOCK FLUSH 100 UNIT/ML IV SOLN
500.0000 [IU] | Freq: Once | INTRAVENOUS | Status: AC | PRN
  Administered 2021-04-26: 500 [IU]
  Filled 2021-04-26: qty 5

## 2021-04-26 MED ORDER — SODIUM CHLORIDE 0.9 % IV SOLN
Freq: Once | INTRAVENOUS | Status: AC
  Filled 2021-04-26: qty 250

## 2021-04-26 NOTE — Assessment & Plan Note (Addendum)
Low on recent labs.  She has lab work scheduled for today through the cancer center.  If it remains low she will need potassium supplementation. ?

## 2021-04-26 NOTE — Assessment & Plan Note (Addendum)
The patient continues to have issues with this.  We will continue Wellbutrin 300 mg once daily.  We will transition her from Lexapro to Zoloft.  She will taper down to 10 mg of Lexapro once daily and start on Zoloft 25 mg once daily for 14 days.  At the end of 14 days she will discontinue the Lexapro and increase the Zoloft to 50 mg.  If she notices no benefit after 3 to 4 weeks she will let us know and we can increase the Zoloft dose.  She will monitor for signs of serotonin syndrome. ?

## 2021-04-26 NOTE — Progress Notes (Signed)
Met c ?

## 2021-04-26 NOTE — Assessment & Plan Note (Signed)
She will continue to follow with oncology. ?

## 2021-04-26 NOTE — Patient Instructions (Signed)
[facility] AT [place]  Discharge Instructions: ?Thank you for choosing [facility] to provide your oncology and hematology care.  ?If you have a lab appointment with the [facility], please go directly to the [facility] and check in at the registration area. ? ?Wear comfortable clothing and clothing appropriate for easy access to any Portacath or PICC line.  ? ?We strive to give you quality time with your provider. You may need to reschedule your appointment if you arrive late (15 or more minutes).  Arriving late affects you and other patients whose appointments are after yours.  Also, if you miss three or more appointments without notifying the office, you may be dismissed from the clinic at the provider?s discretion.    ?  ?For prescription refill requests, have your pharmacy contact our office and allow 72 hours for refills to be completed.   ? ?Today you received the following chemotherapy and/or immunotherapy agents TECENTRIQ    ?  ?To help prevent nausea and vomiting after your treatment, we encourage you to take your nausea medication as directed. ? ?BELOW ARE SYMPTOMS THAT SHOULD BE REPORTED IMMEDIATELY: ?*FEVER GREATER THAN 100.4 F (38 ?C) OR HIGHER ?*CHILLS OR SWEATING ?*NAUSEA AND VOMITING THAT IS NOT CONTROLLED WITH YOUR NAUSEA MEDICATION ?*UNUSUAL SHORTNESS OF BREATH ?*UNUSUAL BRUISING OR BLEEDING ?*URINARY PROBLEMS (pain or burning when urinating, or frequent urination) ?*BOWEL PROBLEMS (unusual diarrhea, constipation, pain near the anus) ?TENDERNESS IN MOUTH AND THROAT WITH OR WITHOUT PRESENCE OF ULCERS (sore throat, sores in mouth, or a toothache) ?UNUSUAL RASH, SWELLING OR PAIN  ?UNUSUAL VAGINAL DISCHARGE OR ITCHING  ? ?Items with * indicate a potential emergency and should be followed up as soon as possible or go to the Emergency Department if any problems should occur. ? ?Please show the CHEMOTHERAPY ALERT CARD or IMMUNOTHERAPY ALERT CARD at check-in to  the Emergency Department and triage nurse. ? ?Should you have questions after your visit or need to cancel or reschedule your appointment, please contact [facility] CANC[facility] [place] AT [place]  [phone] and follow the prompts.  Office hours are 8:00 a.m. to 4:30 p.m. Monday - Friday. Please note that voicemails left after 4:00 p.m. may not be returned until the following business day.  We are closed weekends and major holidays. You have access to a nurse at all times for urgent questions. Please call the main number to the clinic [phone] and follow the prompts. ? ?For any non-urgent questions, you may also contact your provider using MyChart. We now offer e-Visits for anyone 27 and older to request care online for non-urgent symptoms. For details visit [email or web address]. ?  ?Also download the MyChart app! Go to the app store, search "MyChart", open the app, select [place], and log in with your MyChart username and password. ? ?Due to Covid, a mask is required upon entering the hospital/clinic. If you do not have a mask, one will be given to you upon arrival. For doctor visits, patients may have 1 support person aged 76 or older with them. For treatment visits, patients cannot have anyone with them due to current Covid guidelines and our immunocompromised population.  ? ?Atezolizumab injection ?What is this medication? ?ATEZOLIZUMAB (a te zoe LIZ ue mab) is a monoclonal antibody. It is used to treat bladder cancer (urothelial cancer), liver cancer, lung cancer, and melanoma. ?This medicine may be used for other purposes; ask your health care provider or pharmacist if you have questions. ?COMMON BRAND NAME(S): Tecentriq ?  What should I tell my care team before I take this medication? ?They need to know if you have any of these conditions: ?autoimmune diseases like Crohn's disease, ulcerative colitis, or lupus ?have had or planning to have an allogeneic stem cell transplant (uses someone else's  stem cells) ?history of organ transplant ?history of radiation to the chest ?nervous system problems like myasthenia gravis or Guillain-Barre syndrome ?an unusual or allergic reaction to atezolizumab, other medicines, foods, dyes, or preservatives ?pregnant or trying to get pregnant ?breast-feeding ?How should I use this medication? ?This medicine is for infusion into a vein. It is given by a health care professional in a hospital or clinic setting. ?A special MedGuide will be given to you before each treatment. Be sure to read this information carefully each time. ?Talk to your pediatrician regarding the use of this medicine in children. Special care may be needed. ?Overdosage: If you think you have taken too much of this medicine contact a poison control center or emergency room at once. ?NOTE: This medicine is only for you. Do not share this medicine with others. ?What if I miss a dose? ?It is important not to miss your dose. Call your doctor or health care professional if you are unable to keep an appointment. ?What may interact with this medication? ?Interactions have not been studied. ?This list may not describe all possible interactions. Give your health care provider a list of all the medicines, herbs, non-prescription drugs, or dietary supplements you use. Also tell them if you smoke, drink alcohol, or use illegal drugs. Some items may interact with your medicine. ?What should I watch for while using this medication? ?Your condition will be monitored carefully while you are receiving this medicine. ?You may need blood work done while you are taking this medicine. ?Do not become pregnant while taking this medicine or for at least 5 months after stopping it. Women should inform their doctor if they wish to become pregnant or think they might be pregnant. There is a potential for serious side effects to an unborn child. Talk to your health care professional or pharmacist for more information. Do not  breast-feed an infant while taking this medicine or for at least 5 months after the last dose. ?What side effects may I notice from receiving this medication? ?Side effects that you should report to your doctor or health care professional as soon as possible: ?allergic reactions like skin rash, itching or hives, swelling of the face, lips, or tongue ?black, tarry stools ?bloody or watery diarrhea ?breathing problems ?changes in vision ?chest pain or chest tightness ?chills ?facial flushing ?fever ?headache ?signs and symptoms of high blood sugar such as dizziness; dry mouth; dry skin; fruity breath; nausea; stomach pain; increased hunger or thirst; increased urination ?signs and symptoms of liver injury like dark yellow or brown urine; general ill feeling or flu-like symptoms; light-colored stools; loss of appetite; nausea; right upper belly pain; unusually weak or tired; yellowing of the eyes or skin ?stomach pain ?trouble passing urine or change in the amount of urine ?Side effects that usually do not require medical attention (report to your doctor or health care professional if they continue or are bothersome): ?bone pain ?cough ?diarrhea ?joint pain ?muscle pain ?muscle weakness ?swelling of arms or legs ?tiredness ?weight loss ?This list may not describe all possible side effects. Call your doctor for medical advice about side effects. You may report side effects to FDA at 1-800-FDA-1088. ?Where should I keep my medication? ?This drug  is given in a hospital or clinic and will not be stored at home. ?NOTE: This sheet is a summary. It may not cover all possible information. If you have questions about this medicine, talk to your doctor, pharmacist, or health care provider. ?? 2022 Elsevier/Gold Standard (2020-10-11 00:00:00) ? ?

## 2021-04-26 NOTE — Progress Notes (Signed)
?Tommi Rumps, MD ?Phone: [phone] ? ?Anne [name] is a 71 y.o. female who presents today for f/u. ? ?HYPERTENSION ?Disease Monitoring ?Home BP Monitoring not checking Chest pain- no    Dyspnea- no ?Medications ?Compliance-  taking amlodipine.  Edema- no ?BMET ?   ?Component Value Date/Time  ? NA 137 04/19/2021 1010  ? K 3.2 (L) 04/19/2021 1010  ? CL 97 (L) 04/19/2021 1010  ? CO2 31 04/19/2021 1010  ? GLUCOSE 95 04/19/2021 1010  ? BUN 15 04/19/2021 1010  ? CREATININE 1.24 (H) 04/19/2021 1010  ? CREATININE 1.09 02/26/2012 0925  ? CALCIUM 9.2 04/19/2021 1010  ? GFRNONAA 46 (L) 04/19/2021 1010  ? GFRNONAA 54 (L) 02/26/2012 0925  ? GFRAA >60 11/05/2019 2025  ? GFRAA >60 02/26/2012 0925  ? ?Depression: Patient notes continued depression.  Some mild anxiety.  Notes being at home by herself all the time contributes to this.  She notes some improvement with increasing the dose of Wellbutrin.  She continues on Wellbutrin and Lexapro.  No SI. ? ?Small cell lung cancer: She recently followed up with oncology.  Her LFTs were mildly elevated likely due to Tecentriq.  They held treatment and plan to recheck in 1 week.  Her potassium was mildly low as well. ? ?Memory difficulty: Patient notes her memory is so-so.  She notes its not really bad at this time. ? ?Social History  ? ?Tobacco Use  ?Smoking Status Former  ? Packs/day: 2.00  ? Years: 50.00  ? Pack years: 100.00  ? Types: Cigarettes, E-cigarettes  ? Quit date: 02/07/2012  ? Years since quitting: 9.2  ?Smokeless Tobacco Never  ? ? ?Current Outpatient Medications on File Prior to Visit  ?Medication Sig Dispense Refill  ? amLODipine (NORVASC) 5 MG tablet Take 1 tablet (5 mg total) by mouth daily. 90 tablet 3  ? buPROPion (WELLBUTRIN XL) 300 MG 24 hr tablet Take 1 tablet (300 mg total) by mouth daily. 90 tablet 1  ? clobetasol cream (TEMOVATE) 0.98 % Apply 1 application topically 2 (two) times daily. Lower back 60 g 1  ? erythromycin ophthalmic ointment Place 1  application into the right eye daily as needed.    ? fluticasone (FLONASE) 50 MCG/ACT nasal spray Place 2 sprays into both nostrils daily. 16 g 6  ? folic acid (FOLVITE) 1 MG tablet Take 1 tablet by mouth once daily 90 tablet 0  ? ibuprofen (ADVIL,MOTRIN) 200 MG tablet Take 400 mg by mouth every 8 (eight) hours as needed for headache or moderate pain.     ? levothyroxine (SYNTHROID) 100 MCG tablet Take 1 tablet (100 mcg total) by mouth daily before breakfast. 30 tablet 1  ? lidocaine-prilocaine (EMLA) cream Apply to affected area once 30 g 3  ? loperamide (IMODIUM) 2 MG capsule Take 1 capsule (2 mg total) by mouth every 2 (two) hours as needed for diarrhea or loose stools. Do not exceed 16mg  daily. 30 capsule 1  ? Multiple Vitamins-Minerals (PRESERVISION AREDS 2) CAPS Take 1 capsule by mouth 2 (two) times a day.     ? ondansetron (ZOFRAN ODT) 4 MG disintegrating tablet Take 1 tablet (4 mg total) by mouth every 8 (eight) hours as needed for nausea or vomiting. 20 tablet 0  ? pantoprazole (PROTONIX) 40 MG tablet TAKE 1 TABLET BY MOUTH TWICE DAILY BEFORE A MEAL 180 tablet 0  ? polyethylene glycol (MIRALAX / GLYCOLAX) 17 g packet Take 17 g by mouth daily as needed for mild constipation. [place]  each 0  ? rosuvastatin (CRESTOR) 20 MG tablet Take 1 tablet by mouth once daily 90 tablet 0  ? rivastigmine (EXELON) 1.5 MG capsule Take 1 capsule (1.5 mg total) by mouth 2 (two) times daily. 60 capsule 3  ? ?Current Facility-Administered Medications on File Prior to Visit  ?Medication Dose Route Frequency Provider Last Rate Last Admin  ? denosumab (XGEVA) injection 120 mg  120 mg Subcutaneous Q30 days Sindy Guadeloupe, MD   120 mg at 03/05/19 1525  ? ? ? ?ROS see history of present illness ? ?Objective ? ?Physical Exam ?Vitals:  ? 04/26/21 0828  ?BP: 140/80  ?Pulse: (!) 56  ?Temp: 98.5 ?F (36.9 ?C)  ?SpO2: 91%  ? ? ?BP Readings from Last 3 Encounters:  ?04/26/21 140/80  ?04/19/21 (!) 151/94  ?03/29/21 118/75  ? ?Wt Readings from Last 3  Encounters:  ?04/26/21 186 lb 12.8 oz (84.7 kg)  ?04/19/21 186 lb 8 oz (84.6 kg)  ?03/29/21 194 lb (88 kg)  ? ? ?Physical Exam ?Constitutional:   ?   General: She is not in acute distress. ?   Appearance: She is not diaphoretic.  ?Cardiovascular:  ?   Rate and Rhythm: Normal rate and regular rhythm.  ?   Heart sounds: Normal heart sounds.  ?Pulmonary:  ?   Effort: Pulmonary effort is normal.  ?   Breath sounds: Normal breath sounds.  ?Musculoskeletal:  ?   Right lower leg: No edema.  ?   Left lower leg: No edema.  ?Skin: ?   General: Skin is warm and dry.  ?Neurological:  ?   Mental Status: She is alert.  ? ? ? ?Assessment/Plan: Please see individual problem list. ? ?Problem List Items Addressed This Visit   ? ? Benign essential HTN  ?  Well-controlled.  She will continue amlodipine 5 mg once daily. ?  ?  ? Cognitive impairment  ?  Ongoing issues.  She will continue to monitor.  We will continue treatment for depression. ?  ?  ? Depression  ?  The patient continues to have issues with this.  We will continue Wellbutrin 300 mg once daily.  We will transition her from Lexapro to Zoloft.  She will taper down to 10 mg of Lexapro once daily and start on Zoloft 25 mg once daily for 14 days.  At the end of 14 days she will discontinue the Lexapro and increase the Zoloft to 50 mg.  If she notices no benefit after 3 to 4 weeks she will let us know and we can increase the Zoloft dose.  She will monitor for signs of serotonin syndrome. ?  ?  ? Relevant Medications  ? sertraline (ZOLOFT) 50 MG tablet  ? escitalopram (LEXAPRO) 20 MG tablet  ? Hypokalemia  ?  Low on recent labs.  She has lab work scheduled for today through the cancer center.  If it remains low she will need potassium supplementation. ?  ?  ? Small cell lung cancer, right upper lobe ([place])  ?  She will continue to follow with oncology. ?  ?  ? ? ? ?Return in about 6 weeks (around 06/07/2021) for Depression/anxiety. ? ?This visit occurred during the SARS-CoV-2 public  health emergency.  Safety protocols were in place, including screening questions prior to the visit, additional usage of staff PPE, and extensive cleaning of exam room while observing appropriate contact time as indicated for disinfecting solutions.  ? ? ?Tommi Rumps, MD ?[facility] -  [place] ? ?

## 2021-04-26 NOTE — Assessment & Plan Note (Signed)
Ongoing issues.  She will continue to monitor.  We will continue treatment for depression. ?

## 2021-04-26 NOTE — Patient Instructions (Signed)
Nice to see you. ?We are going to transition you from Lexapro to Zoloft.  You will decrease your Lexapro dose to 10 mg once daily for 14 days and at the same time started on Zoloft 25 mg once daily for 14 days.  At the end of 14 days you will discontinue the Lexapro and increase the Zoloft to 50 mg.  If you have not noticed any benefit with your depression or anxiety on the 50 mg dose of Zoloft after about 3 to 4 weeks please let us know and we can increase the dose further. ?If your potassium is still low in the cancer center rechecked your labs today you will need to have potassium supplementation. ?

## 2021-04-26 NOTE — Assessment & Plan Note (Signed)
Well-controlled.  She will continue amlodipine 5 mg once daily. 

## 2021-04-27 ENCOUNTER — Telehealth: Payer: Self-pay

## 2021-04-27 ENCOUNTER — Other Ambulatory Visit: Payer: Self-pay

## 2021-04-27 DIAGNOSIS — R7989 Other specified abnormal findings of blood chemistry: Secondary | ICD-10-CM

## 2021-04-27 NOTE — Telephone Encounter (Signed)
Reached out to pt in regards to lab appointment on 05/03/21 at 1:15pm. Pt understands and is aware.  ?

## 2021-05-03 ENCOUNTER — Inpatient Hospital Stay: Payer: PPO

## 2021-05-04 ENCOUNTER — Telehealth: Payer: Self-pay | Admitting: *Deleted

## 2021-05-04 NOTE — Telephone Encounter (Signed)
Patient called. Stating she missed her apt today in the clinic and would like to get the apt r/s. ? ?Anne [name] stated that she spoke with patient and r/s this apt. ?

## 2021-05-05 ENCOUNTER — Inpatient Hospital Stay: Payer: PPO

## 2021-05-05 DIAGNOSIS — R7989 Other specified abnormal findings of blood chemistry: Secondary | ICD-10-CM

## 2021-05-05 DIAGNOSIS — Z5112 Encounter for antineoplastic immunotherapy: Secondary | ICD-10-CM | POA: Diagnosis not present

## 2021-05-05 LAB — COMPREHENSIVE METABOLIC PANEL
ALT: 44 U/L (ref 0–44)
AST: 61 U/L — ABNORMAL HIGH (ref 15–41)
Albumin: 4.3 g/dL (ref 3.5–5.0)
Alkaline Phosphatase: 101 U/L (ref 38–126)
Anion gap: 7 (ref 5–15)
BUN: 10 mg/dL (ref 8–23)
CO2: 33 mmol/L — ABNORMAL HIGH (ref 22–32)
Calcium: 8.9 mg/dL (ref 8.9–10.3)
Chloride: 96 mmol/L — ABNORMAL LOW (ref 98–111)
Creatinine, Ser: 1.22 mg/dL — ABNORMAL HIGH (ref 0.44–1.00)
GFR, Estimated: 47 mL/min — ABNORMAL LOW (ref >60)
Glucose, Bld: 91 mg/dL (ref 70–99)
Potassium: 3.4 mmol/L — ABNORMAL LOW (ref 3.5–5.1)
Sodium: 136 mmol/L (ref 135–145)
Total Bilirubin: 0.3 mg/dL (ref 0.3–1.2)
Total Protein: 7.4 g/dL (ref 6.5–8.1)

## 2021-05-07 ENCOUNTER — Encounter: Payer: Self-pay | Admitting: Oncology

## 2021-05-10 ENCOUNTER — Ambulatory Visit: Payer: PPO | Admitting: Oncology

## 2021-05-10 ENCOUNTER — Other Ambulatory Visit: Payer: PPO

## 2021-05-10 ENCOUNTER — Ambulatory Visit: Payer: PPO

## 2021-05-16 ENCOUNTER — Other Ambulatory Visit: Payer: Self-pay | Admitting: *Deleted

## 2021-05-16 DIAGNOSIS — E063 Autoimmune thyroiditis: Secondary | ICD-10-CM

## 2021-05-16 DIAGNOSIS — C3411 Malignant neoplasm of upper lobe, right bronchus or lung: Secondary | ICD-10-CM

## 2021-05-17 ENCOUNTER — Inpatient Hospital Stay: Payer: PPO

## 2021-05-17 ENCOUNTER — Inpatient Hospital Stay: Payer: PPO | Attending: Oncology | Admitting: Oncology

## 2021-05-17 ENCOUNTER — Other Ambulatory Visit: Payer: Self-pay | Admitting: *Deleted

## 2021-05-17 VITALS — BP 132/86 | HR 71 | Temp 98.2°F | Resp 16 | Ht 66.0 in | Wt 184.3 lb

## 2021-05-17 DIAGNOSIS — E063 Autoimmune thyroiditis: Secondary | ICD-10-CM | POA: Insufficient documentation

## 2021-05-17 DIAGNOSIS — Z79890 Hormone replacement therapy (postmenopausal): Secondary | ICD-10-CM | POA: Diagnosis not present

## 2021-05-17 DIAGNOSIS — Z87891 Personal history of nicotine dependence: Secondary | ICD-10-CM | POA: Insufficient documentation

## 2021-05-17 DIAGNOSIS — C3411 Malignant neoplasm of upper lobe, right bronchus or lung: Secondary | ICD-10-CM | POA: Diagnosis not present

## 2021-05-17 DIAGNOSIS — R7989 Other specified abnormal findings of blood chemistry: Secondary | ICD-10-CM

## 2021-05-17 DIAGNOSIS — Z5112 Encounter for antineoplastic immunotherapy: Secondary | ICD-10-CM | POA: Insufficient documentation

## 2021-05-17 DIAGNOSIS — Z79899 Other long term (current) drug therapy: Secondary | ICD-10-CM | POA: Insufficient documentation

## 2021-05-17 DIAGNOSIS — K7460 Unspecified cirrhosis of liver: Secondary | ICD-10-CM | POA: Diagnosis not present

## 2021-05-17 DIAGNOSIS — Z8041 Family history of malignant neoplasm of ovary: Secondary | ICD-10-CM | POA: Insufficient documentation

## 2021-05-17 DIAGNOSIS — N179 Acute kidney failure, unspecified: Secondary | ICD-10-CM | POA: Insufficient documentation

## 2021-05-17 DIAGNOSIS — C7951 Secondary malignant neoplasm of bone: Secondary | ICD-10-CM | POA: Diagnosis not present

## 2021-05-17 LAB — CBC WITH DIFFERENTIAL/PLATELET
Abs Immature Granulocytes: 0.01 10*3/uL (ref 0.00–0.07)
Basophils Absolute: 0.1 10*3/uL (ref 0.0–0.1)
Basophils Relative: 2 %
Eosinophils Absolute: 0.2 10*3/uL (ref 0.0–0.5)
Eosinophils Relative: 4 %
HCT: 37.8 % (ref 36.0–46.0)
Hemoglobin: 12.1 g/dL (ref 12.0–15.0)
Immature Granulocytes: 0 %
Lymphocytes Relative: 24 %
Lymphs Abs: 1.3 10*3/uL (ref 0.7–4.0)
MCH: 28.5 pg (ref 26.0–34.0)
MCHC: 32 g/dL (ref 30.0–36.0)
MCV: 89.2 fL (ref 80.0–100.0)
Monocytes Absolute: 0.3 10*3/uL (ref 0.1–1.0)
Monocytes Relative: 5 %
Neutro Abs: 3.5 10*3/uL (ref 1.7–7.7)
Neutrophils Relative %: 65 %
Platelets: 173 10*3/uL (ref 150–400)
RBC: 4.24 MIL/uL (ref 3.87–5.11)
RDW: 17.2 % — ABNORMAL HIGH (ref 11.5–15.5)
WBC: 5.3 10*3/uL (ref 4.0–10.5)
nRBC: 0 % (ref 0.0–0.2)

## 2021-05-17 LAB — COMPREHENSIVE METABOLIC PANEL
ALT: 67 U/L — ABNORMAL HIGH (ref 0–44)
AST: 115 U/L — ABNORMAL HIGH (ref 15–41)
Albumin: 4.1 g/dL (ref 3.5–5.0)
Alkaline Phosphatase: 96 U/L (ref 38–126)
Anion gap: 9 (ref 5–15)
BUN: 16 mg/dL (ref 8–23)
CO2: 32 mmol/L (ref 22–32)
Calcium: 9.2 mg/dL (ref 8.9–10.3)
Chloride: 97 mmol/L — ABNORMAL LOW (ref 98–111)
Creatinine, Ser: 1.31 mg/dL — ABNORMAL HIGH (ref 0.44–1.00)
GFR, Estimated: 43 mL/min — ABNORMAL LOW (ref >60)
Glucose, Bld: 84 mg/dL (ref 70–99)
Potassium: 3.6 mmol/L (ref 3.5–5.1)
Sodium: 138 mmol/L (ref 135–145)
Total Bilirubin: 0.6 mg/dL (ref 0.3–1.2)
Total Protein: 7.8 g/dL (ref 6.5–8.1)

## 2021-05-17 LAB — TSH: TSH: 90.657 u[IU]/mL — ABNORMAL HIGH (ref 0.350–4.500)

## 2021-05-17 MED ORDER — SODIUM CHLORIDE 0.9 % IV SOLN
1200.0000 mg | Freq: Once | INTRAVENOUS | Status: AC
  Administered 2021-05-17: 1200 mg via INTRAVENOUS
  Filled 2021-05-17: qty 20

## 2021-05-17 MED ORDER — SODIUM CHLORIDE 0.9 % IV SOLN
Freq: Once | INTRAVENOUS | Status: AC
  Filled 2021-05-17: qty 250

## 2021-05-17 MED ORDER — SODIUM CHLORIDE 0.9 % IV SOLN
INTRAVENOUS | Status: DC
  Filled 2021-05-17 (×2): qty 250

## 2021-05-17 MED ORDER — HEPARIN SOD (PORK) LOCK FLUSH 100 UNIT/ML IV SOLN
500.0000 [IU] | Freq: Once | INTRAVENOUS | Status: AC | PRN
  Administered 2021-05-17: 500 [IU]
  Filled 2021-05-17: qty 5

## 2021-05-17 NOTE — Progress Notes (Signed)
? ? ? ?Hematology/Oncology Consult note ?[place]  ?Telephone:([phone]) [phone] [phone] ? ?Patient Care Team: ?Leone Haven, MD as PCP - General (Family Medicine) ?Leone Haven, MD as Consulting Physician (Family Medicine) ?Robert Bellow, MD (General Surgery) ?Telford Nab, RN as Equities trader ?Sindy Guadeloupe, MD as Consulting Physician (Hematology and Oncology)  ? ?Name of the patient: Anne [name]  ?[id number]  ?10-26-1950  ? ?Date of visit: 05/17/21 ? 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 ?Chief complaint/ Reason for visit-on treatment assessment prior to cycle 42 of maintenance Tecentriq ? ?Heme/Onc history: patient is a 71 year old female with a past medical history significant for hypertension hyperlipidemia obesity and cirrhosis of the liver among other medical problems.  She has been referred to [place] for findings of bone metastases and her recent MRI.  She has a prior history of 3 packs/day day smoking for over 45 years and quit smoking 5 years ago.She had a CT chest abdomen pelvis in 2018 which showed a 5 mm lung nodule in the left lower lobe.  Recently over the last 2 months patient has been having worsening back pain and was referred to orthopedics.  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  Pathologic fracture of S1. ?  ?PET scan showed 2 RUL lung nodules, hilar and mediastinal adenopathy and widespread bone mets. MRI brain negative. ?  ?Patient completed palliative RT to her spine. Bronchoscopy showed small cell lung cancer.  Palliative carboplatin, etoposide and Tecentriq started on 08/18/2018. Scans after 4 cycles showed stable disease. 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  She reports being compliant but her  values fluctuate widely.   ?  ?  ? ?Interval history-she is doing well overall and denies any specific complaints at this time.  Tolerating treatment well without any side effects such as diarrhea nausea vomiting or shortness of breath.  Appetite and weight have remained stable ? ?ECOG PS- 1 ?Pain scale- 0 ? ? ?Review of systems- Review of Systems  ?Constitutional:  Negative for chills, fever, malaise/fatigue and weight loss.  ?HENT:  Negative for congestion, ear discharge and nosebleeds.   ?Eyes:  Negative for blurred vision.  ?Respiratory:  Negative for cough, hemoptysis, sputum production, shortness of breath and wheezing.   ?Cardiovascular:  Negative for chest pain, palpitations, orthopnea and claudication.  ?Gastrointestinal:  Negative for abdominal pain, blood in stool, constipation, diarrhea, heartburn, melena, nausea and vomiting.  ?Genitourinary:  Negative for dysuria, flank pain, frequency, hematuria and urgency.  ?Musculoskeletal:  Negative for back pain, joint pain and myalgias.  ?Skin:  Negative for rash.  ?Neurological:  Negative for dizziness, tingling, focal weakness, seizures, weakness and headaches.  ?Endo/Heme/Allergies:  Does not bruise/bleed easily.  ?Psychiatric/Behavioral:  Negative for depression and suicidal ideas. The patient does not have insomnia.    ? ? ?Allergies  ?Allergen Reactions  ? Bee Venom Swelling  ? ? ? ?Past Medical History:  ?Diagnosis Date  ? Arthritis   ? Cirrhosis ([place])   ? Depression   ? GERD (gastroesophageal reflux disease)   ? Hyperlipidemia   ? Hypertension   ? Lung cancer ([place])   ? Metastatic bone cancer ([place])   ? ? ? ?Past Surgical History:  ?Procedure Laterality Date  ? APPENDECTOMY  1971  ? CHOLECYSTECTOMY  1971  ? COLONOSCOPY WITH PROPOFOL N/A 11/15/2016  ? Procedure: COLONOSCOPY WITH PROPOFOL;  Surgeon: Jonathon Bellows, MD;  Location: [facility] ENDOSCOPY;  Service: Gastroenterology;  Laterality: N/A;  ? ENDOBRONCHIAL ULTRASOUND Right 07/30/2018  ? Procedure: ENDOBRONCHIAL  ULTRASOUND;  Surgeon: Laverle Hobby, MD;  Location: [facility] ORS;  Service: Pulmonary;  Laterality: Right;  ? ESOPHAGOGASTRODUODENOSCOPY (EGD) WITH PROPOFOL N/A 01/07/2017  ? Procedure: ESOPHAGOGASTRODUODENOSCOPY (EGD) WITH PROPOFOL;  Surgeon: Jonathon Bellows, MD;  Location: [facility] ENDOSCOPY;  Service: Gastroenterology;  Laterality: N/A;  ? LAPAROSCOPY N/A 03/01/2017  ? Procedure: LAPAROSCOPY DIAGNOSTIC;  Surgeon: Robert Bellow, MD;  Location: [facility] ORS;  Service: General;  Laterality: N/A;  ? PORTA CATH INSERTION N/A 08/14/2018  ? Procedure: PORTA CATH INSERTION;  Surgeon: Algernon Huxley, MD;  Location: [place] CV LAB;  Service: Cardiovascular;  Laterality: N/A;  ? TONSILECTOMY, ADENOIDECTOMY, BILATERAL [place]  ? TONSILLECTOMY    ? VENTRAL HERNIA REPAIR N/A 03/01/2017  ? 10 x 14 CM Ventralight ST mesh, intraperitoneal location.   ? VENTRAL HERNIA REPAIR N/A 03/01/2017  ? Procedure: HERNIA REPAIR VENTRAL ADULT;  Surgeon: Robert Bellow, MD;  Location: [facility] ORS;  Service: General;  Laterality: N/A;  ? ? ?Social History  ? ?Socioeconomic History  ? Marital status: Married  ?  Spouse name: [name]  ? Number of children: Not on file  ? Years of education: Not on file  ? Highest education level: Not on file  ?Occupational History  ? Not on file  ?Tobacco Use  ? Smoking status: Former  ?  Packs/day: 2.00  ?  Years: 50.00  ?  Pack years: 100.00  ?  Types: Cigarettes, E-cigarettes  ?  Quit date: 02/07/2012  ?  Years since quitting: 9.2  ? Smokeless tobacco: Never  ?Vaping Use  ? Vaping Use: Some days  ? Start date: 10/06/2013  ? Devices: uses no liquid  ?Substance and Sexual Activity  ? Alcohol use: No  ?  Alcohol/week: 0.0 standard drinks  ? Drug use: No  ? Sexual activity: Yes  ?Other Topics Concern  ? Not on file  ?Social History Narrative  ? Married  ? Retired  ? High School-highest level of education   ? No children   ? 1 cup of coffee  ? ?Social Determinants of Health  ? ?Financial Resource Strain:  Low Risk   ? Difficulty of Paying Living Expenses: Not hard at all  ?Food Insecurity: No Food Insecurity  ? Worried About Charity fundraiser in the Last Year: Never true  ? Ran Out of Food in the Last Year: Never true  ?Transportation Needs: No Transportation Needs  ? Lack of Transportation (Medical): No  ? Lack of Transportation (Non-Medical): No  ?Physical Activity: Not on file  ?Stress: No Stress Concern Present  ? Feeling of Stress : Not at all  ?Social Connections: Unknown  ? Frequency of Communication with Friends and Family: More than three times a week  ? Frequency of Social Gatherings with Friends and Family: Not on file  ? Attends Religious Services: Not on file  ? Active Member of Clubs or Organizations: Not on file  ? Attends Archivist Meetings: Not on file  ? Marital Status: Married  ?Intimate Partner Violence: Not At Risk  ? Fear of Current or Ex-Partner: No  ? Emotionally Abused: No  ? Physically Abused: No  ? Sexually Abused: No  ? ? ?Family History  ?Problem Relation Age of Onset  ?  Hypertension Mother   ? Ovarian cancer Mother 8  ? Heart disease Father   ? Stroke Father   ? Ovarian cancer Sister   ?     ? dx cancer had hyst.  ? Breast cancer Neg Hx   ? ? ? ?Current Outpatient Medications:  ?  amLODipine (NORVASC) 5 MG tablet, Take 1 tablet (5 mg total) by mouth daily., Disp: 90 tablet, Rfl: 3 ?  buPROPion (WELLBUTRIN XL) 300 MG 24 hr tablet, Take 1 tablet (300 mg total) by mouth daily., Disp: 90 tablet, Rfl: 1 ?  clobetasol cream (TEMOVATE) 1.88 %, Apply 1 application topically 2 (two) times daily. Lower back, Disp: 60 g, Rfl: 1 ?  erythromycin ophthalmic ointment, Place 1 application into the right eye daily as needed., Disp: , Rfl:  ?  escitalopram (LEXAPRO) 20 MG tablet, Take 0.5 tablets (10 mg total) by mouth daily for 14 days. Then discontinue., Disp: , Rfl:  ?  fluticasone (FLONASE) 50 MCG/ACT nasal spray, Place 2 sprays into both nostrils daily., Disp: 16 g, Rfl: 6 ?  folic acid  (FOLVITE) 1 MG tablet, Take 1 tablet by mouth once daily, Disp: 90 tablet, Rfl: 0 ?  ibuprofen (ADVIL,MOTRIN) 200 MG tablet, Take 400 mg by mouth every 8 (eight) hours as needed for headache or moderat

## 2021-05-17 NOTE — Progress Notes (Signed)
Pt states she has no concerns ?

## 2021-05-17 NOTE — Progress Notes (Signed)
Lab entry

## 2021-05-24 ENCOUNTER — Inpatient Hospital Stay: Payer: PPO

## 2021-05-30 ENCOUNTER — Telehealth: Payer: Self-pay | Admitting: Pharmacist

## 2021-05-30 DIAGNOSIS — H353211 Exudative age-related macular degeneration, right eye, with active choroidal neovascularization: Secondary | ICD-10-CM | POA: Diagnosis not present

## 2021-05-30 DIAGNOSIS — E063 Autoimmune thyroiditis: Secondary | ICD-10-CM

## 2021-05-30 MED ORDER — LEVOTHYROXINE SODIUM 100 MCG PO TABS: 100.0000 ug | ORAL_TABLET | Freq: Every day | ORAL | 1 refills | Status: DC

## 2021-05-30 NOTE — Telephone Encounter (Signed)
Oral Chemotherapy Pharmacist Encounter  ? ?Asked by Dr. Janese Banks to look into patient's levothyroxine adherence.  ? ?Pharmacy student Judeen Hammans called patient's local [facility] who stated they last filled levothyroxine for Anne [name] on 11/25/20 as a 30 day supply.  ? ?Attempted to call patient to discuss taking the medication, no answer and unable to LVM. Will continue to attempt to contact patient. Her next office visit is scheduled for 06/07/21. ? ?Refill sent to her local pharmacy.  ? ? ?Darl Pikes, PharmD, BCPS, BCOP, CPP ?Hematology/Oncology Clinical Pharmacist ?[place]/[facility]/AP Oral Chemotherapy [facility] ?[phone] ? ?05/30/2021 10:02 AM ? ?

## 2021-06-06 ENCOUNTER — Other Ambulatory Visit: Payer: Self-pay | Admitting: Oncology

## 2021-06-07 ENCOUNTER — Encounter: Payer: Self-pay | Admitting: Family Medicine

## 2021-06-07 ENCOUNTER — Inpatient Hospital Stay: Payer: PPO | Attending: Oncology

## 2021-06-07 ENCOUNTER — Ambulatory Visit (INDEPENDENT_AMBULATORY_CARE_PROVIDER_SITE_OTHER): Payer: PPO | Admitting: Family Medicine

## 2021-06-07 ENCOUNTER — Inpatient Hospital Stay: Payer: PPO

## 2021-06-07 ENCOUNTER — Inpatient Hospital Stay (HOSPITAL_BASED_OUTPATIENT_CLINIC_OR_DEPARTMENT_OTHER): Payer: PPO | Admitting: Oncology

## 2021-06-07 ENCOUNTER — Encounter: Payer: Self-pay | Admitting: Oncology

## 2021-06-07 VITALS — BP 156/98 | HR 68 | Temp 96.4°F | Resp 16 | Wt 179.8 lb

## 2021-06-07 VITALS — BP 130/80 | HR 71 | Temp 98.1°F | Ht 66.0 in | Wt 182.8 lb

## 2021-06-07 DIAGNOSIS — C3411 Malignant neoplasm of upper lobe, right bronchus or lung: Secondary | ICD-10-CM

## 2021-06-07 DIAGNOSIS — F331 Major depressive disorder, recurrent, moderate: Secondary | ICD-10-CM | POA: Diagnosis not present

## 2021-06-07 DIAGNOSIS — Z79890 Hormone replacement therapy (postmenopausal): Secondary | ICD-10-CM | POA: Diagnosis not present

## 2021-06-07 DIAGNOSIS — Z5112 Encounter for antineoplastic immunotherapy: Secondary | ICD-10-CM

## 2021-06-07 DIAGNOSIS — R7989 Other specified abnormal findings of blood chemistry: Secondary | ICD-10-CM

## 2021-06-07 DIAGNOSIS — Z8041 Family history of malignant neoplasm of ovary: Secondary | ICD-10-CM | POA: Insufficient documentation

## 2021-06-07 DIAGNOSIS — Z87891 Personal history of nicotine dependence: Secondary | ICD-10-CM | POA: Insufficient documentation

## 2021-06-07 DIAGNOSIS — R945 Abnormal results of liver function studies: Secondary | ICD-10-CM | POA: Insufficient documentation

## 2021-06-07 DIAGNOSIS — C7951 Secondary malignant neoplasm of bone: Secondary | ICD-10-CM | POA: Insufficient documentation

## 2021-06-07 DIAGNOSIS — N179 Acute kidney failure, unspecified: Secondary | ICD-10-CM

## 2021-06-07 DIAGNOSIS — K7460 Unspecified cirrhosis of liver: Secondary | ICD-10-CM | POA: Diagnosis not present

## 2021-06-07 DIAGNOSIS — E063 Autoimmune thyroiditis: Secondary | ICD-10-CM | POA: Insufficient documentation

## 2021-06-07 DIAGNOSIS — Z79899 Other long term (current) drug therapy: Secondary | ICD-10-CM | POA: Insufficient documentation

## 2021-06-07 DIAGNOSIS — Z452 Encounter for adjustment and management of vascular access device: Secondary | ICD-10-CM | POA: Diagnosis not present

## 2021-06-07 DIAGNOSIS — R4781 Slurred speech: Secondary | ICD-10-CM | POA: Insufficient documentation

## 2021-06-07 LAB — CBC WITH DIFFERENTIAL/PLATELET
Abs Immature Granulocytes: 0.02 10*3/uL (ref 0.00–0.07)
Basophils Absolute: 0.1 10*3/uL (ref 0.0–0.1)
Basophils Relative: 1 %
Eosinophils Absolute: 0.1 10*3/uL (ref 0.0–0.5)
Eosinophils Relative: 2 %
HCT: 37.1 % (ref 36.0–46.0)
Hemoglobin: 11.9 g/dL — ABNORMAL LOW (ref 12.0–15.0)
Immature Granulocytes: 0 %
Lymphocytes Relative: 22 %
Lymphs Abs: 1.1 10*3/uL (ref 0.7–4.0)
MCH: 28.7 pg (ref 26.0–34.0)
MCHC: 32.1 g/dL (ref 30.0–36.0)
MCV: 89.6 fL (ref 80.0–100.0)
Monocytes Absolute: 0.3 10*3/uL (ref 0.1–1.0)
Monocytes Relative: 6 %
Neutro Abs: 3.5 10*3/uL (ref 1.7–7.7)
Neutrophils Relative %: 69 %
Platelets: 196 10*3/uL (ref 150–400)
RBC: 4.14 MIL/uL (ref 3.87–5.11)
RDW: 17.4 % — ABNORMAL HIGH (ref 11.5–15.5)
WBC: 5.1 10*3/uL (ref 4.0–10.5)
nRBC: 0 % (ref 0.0–0.2)

## 2021-06-07 LAB — COMPREHENSIVE METABOLIC PANEL
ALT: 65 U/L — ABNORMAL HIGH (ref 0–44)
AST: 97 U/L — ABNORMAL HIGH (ref 15–41)
Albumin: 4.4 g/dL (ref 3.5–5.0)
Alkaline Phosphatase: 96 U/L (ref 38–126)
Anion gap: 10 (ref 5–15)
BUN: 13 mg/dL (ref 8–23)
CO2: 30 mmol/L (ref 22–32)
Calcium: 9.3 mg/dL (ref 8.9–10.3)
Chloride: 97 mmol/L — ABNORMAL LOW (ref 98–111)
Creatinine, Ser: 1.51 mg/dL — ABNORMAL HIGH (ref 0.44–1.00)
GFR, Estimated: 36 mL/min — ABNORMAL LOW (ref >60)
Glucose, Bld: 92 mg/dL (ref 70–99)
Potassium: 3.4 mmol/L — ABNORMAL LOW (ref 3.5–5.1)
Sodium: 137 mmol/L (ref 135–145)
Total Bilirubin: 1.1 mg/dL (ref 0.3–1.2)
Total Protein: 8.2 g/dL — ABNORMAL HIGH (ref 6.5–8.1)

## 2021-06-07 MED ORDER — SERTRALINE HCL 100 MG PO TABS: 100.0000 mg | ORAL_TABLET | Freq: Every day | ORAL | 1 refills | Status: DC

## 2021-06-07 MED ORDER — SODIUM CHLORIDE 0.9 % IV SOLN
Freq: Once | INTRAVENOUS | Status: AC
  Filled 2021-06-07: qty 250

## 2021-06-07 MED ORDER — HEPARIN SOD (PORK) LOCK FLUSH 100 UNIT/ML IV SOLN
INTRAVENOUS | Status: AC
  Filled 2021-06-07: qty 5

## 2021-06-07 MED ORDER — SODIUM CHLORIDE 0.9 % IV SOLN
1200.0000 mg | Freq: Once | INTRAVENOUS | Status: AC
  Administered 2021-06-07: 1200 mg via INTRAVENOUS
  Filled 2021-06-07: qty 20

## 2021-06-07 MED ORDER — HEPARIN SOD (PORK) LOCK FLUSH 100 UNIT/ML IV SOLN
500.0000 [IU] | Freq: Once | INTRAVENOUS | Status: AC | PRN
  Administered 2021-06-07: 500 [IU]
  Filled 2021-06-07: qty 5

## 2021-06-07 NOTE — Patient Instructions (Signed)
[facility] CANCER CTR AT [place]  Discharge Instructions: ?Thank you for choosing [facility] to provide your oncology and hematology care.  ?If you have a lab appointment with the [facility], please go directly to the [facility] and check in at the registration area. ? ?Wear comfortable clothing and clothing appropriate for easy access to any Portacath or PICC line.  ? ?We strive to give you quality time with your provider. You may need to reschedule your appointment if you arrive late (15 or more minutes).  Arriving late affects you and other patients whose appointments are after yours.  Also, if you miss three or more appointments without notifying the office, you may be dismissed from the clinic at the provider?s discretion.    ?  ?For prescription refill requests, have your pharmacy contact our office and allow 72 hours for refills to be completed.   ? ?Today you received the following chemotherapy and/or immunotherapy agents Tecentriq    ?  ?To help prevent nausea and vomiting after your treatment, we encourage you to take your nausea medication as directed. ? ?BELOW ARE SYMPTOMS THAT SHOULD BE REPORTED IMMEDIATELY: ?*FEVER GREATER THAN 100.4 F (38 ?C) OR HIGHER ?*CHILLS OR SWEATING ?*NAUSEA AND VOMITING THAT IS NOT CONTROLLED WITH YOUR NAUSEA MEDICATION ?*UNUSUAL SHORTNESS OF BREATH ?*UNUSUAL BRUISING OR BLEEDING ?*URINARY PROBLEMS (pain or burning when urinating, or frequent urination) ?*BOWEL PROBLEMS (unusual diarrhea, constipation, pain near the anus) ?TENDERNESS IN MOUTH AND THROAT WITH OR WITHOUT PRESENCE OF ULCERS (sore throat, sores in mouth, or a toothache) ?UNUSUAL RASH, SWELLING OR PAIN  ?UNUSUAL VAGINAL DISCHARGE OR ITCHING  ? ?Items with * indicate a potential emergency and should be followed up as soon as possible or go to the Emergency Department if any problems should occur. ? ?Please show the CHEMOTHERAPY ALERT CARD or IMMUNOTHERAPY ALERT CARD at check-in to  the Emergency Department and triage nurse. ? ?Should you have questions after your visit or need to cancel or reschedule your appointment, please contact [facility] CANCER [place] AT [place]  [phone] and follow the prompts.  Office hours are 8:00 a.m. to 4:30 p.m. Monday - Friday. Please note that voicemails left after 4:00 p.m. may not be returned until the following business day.  We are closed weekends and major holidays. You have access to a nurse at all times for urgent questions. Please call the main number to the clinic [phone] and follow the prompts. ? ?For any non-urgent questions, you may also contact your provider using MyChart. We now offer e-Visits for anyone 80 and older to request care online for non-urgent symptoms. For details visit [email or web address]. ?  ?Also download the MyChart app! Go to the app store, search "MyChart", open the app, select [facility], and log in with your MyChart username and password. ? ?Due to Covid, a mask is required upon entering the hospital/clinic. If you do not have a mask, one will be given to you upon arrival. For doctor visits, patients may have 1 support person aged 13 or older with them. For treatment visits, patients cannot have anyone with them due to current Covid guidelines and our immunocompromised population.  ?

## 2021-06-07 NOTE — Progress Notes (Signed)
? ? ? ?Hematology/Oncology Consult note ?[place]  ?Telephone:([phone]) [phone] [phone] ? ?Patient Care Team: ?Leone Haven, MD as PCP - General (Family Medicine) ?Leone [place], MD as Consulting Physician (Family Medicine) ?Robert Bellow, MD (General Surgery) ?Telford Nab, RN as Equities trader ?Sindy Guadeloupe, MD as Consulting Physician (Hematology and Oncology)  ? ?Name of the patient: Anne [name]  ?[id number]  ?12/05/1950  ? ?Date of visit: 06/07/21 ? 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  ? ?Chief complaint/ Reason for visit-on treatment assessment prior to cycle 43 of maintenance Tecentriq ? ?Heme/Onc history: patient is a 71 year old female with a past medical history significant for hypertension hyperlipidemia obesity and cirrhosis of the liver among other medical problems.  She has been referred to [place] for findings of bone metastases and her recent MRI.  She has a prior history of 3 packs/day day smoking for over 45 years and quit smoking 5 years ago.She had a CT chest abdomen pelvis in 2018 which showed a 5 mm lung nodule in the left lower lobe.  Recently over the last 2 months patient has been having worsening back pain and was referred to orthopedics.  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  Pathologic fracture of S1. ?  ?PET scan showed 2 RUL lung nodules, hilar and mediastinal adenopathy and widespread bone mets. MRI brain negative. ?  ?Patient completed palliative RT to her spine. Bronchoscopy showed small cell lung cancer.  Palliative carboplatin, etoposide and Tecentriq started on 08/18/2018. Scans after 4 cycles showed stable disease. 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  She reports being compliant but her  values fluctuate widely.   ?  ? ?Interval history-although patient states that she has been compliant with her medications including levothyroxine when we called her pharmacy she has not picked up any thyroid medicine since October 2022.  She reports no complaints today ? ?ECOG PS- 1 ?Pain scale- 0 ?Opioid associated constipation- no ? ?Review of systems- Review of Systems  ?Constitutional:  Positive for malaise/fatigue. Negative for chills, fever and weight loss.  ?HENT:  Negative for congestion, ear discharge and nosebleeds.   ?Eyes:  Negative for blurred vision.  ?Respiratory:  Negative for cough, hemoptysis, sputum production, shortness of breath and wheezing.   ?Cardiovascular:  Negative for chest pain, palpitations, orthopnea and claudication.  ?Gastrointestinal:  Negative for abdominal pain, blood in stool, constipation, diarrhea, heartburn, melena, nausea and vomiting.  ?Genitourinary:  Negative for dysuria, flank pain, frequency, hematuria and urgency.  ?Musculoskeletal:  Negative for back pain, joint pain and myalgias.  ?Skin:  Negative for rash.  ?Neurological:  Negative for dizziness, tingling, focal weakness, seizures, weakness and headaches.  ?Endo/Heme/Allergies:  Does not bruise/bleed easily.  ?Psychiatric/Behavioral:  Negative for depression and suicidal ideas. The patient does not have insomnia.    ? ? ?Allergies  ?Allergen Reactions  ? Bee Venom Swelling  ? ? ? ?Past Medical History:  ?Diagnosis Date  ? Arthritis   ? Cirrhosis ([place])   ? Depression   ? GERD (gastroesophageal reflux disease)   ? Hyperlipidemia   ? Hypertension   ? Lung cancer ([place])   ? Metastatic bone cancer   ? ? ? ?Past Surgical History:  ?Procedure Laterality Date  ? APPENDECTOMY  1971  ?  CHOLECYSTECTOMY  1971  ? COLONOSCOPY WITH PROPOFOL N/A 11/15/2016  ? Procedure: COLONOSCOPY WITH PROPOFOL;  Surgeon: Jonathon Bellows, MD;  Location: [facility] ENDOSCOPY;  Service: Gastroenterology;  Laterality: N/A;  ? ENDOBRONCHIAL ULTRASOUND Right  07/30/2018  ? Procedure: ENDOBRONCHIAL ULTRASOUND;  Surgeon: Laverle Hobby, MD;  Location: [facility] ORS;  Service: Pulmonary;  Laterality: Right;  ? ESOPHAGOGASTRODUODENOSCOPY (EGD) WITH PROPOFOL N/A 01/07/2017  ? Procedure: ESOPHAGOGASTRODUODENOSCOPY (EGD) WITH PROPOFOL;  Surgeon: Jonathon Bellows, MD;  Location: [facility] ENDOSCOPY;  Service: Gastroenterology;  Laterality: N/A;  ? LAPAROSCOPY N/A 03/01/2017  ? Procedure: LAPAROSCOPY DIAGNOSTIC;  Surgeon: Robert Bellow, MD;  Location: [facility] ORS;  Service: General;  Laterality: N/A;  ? PORTA CATH INSERTION N/A 08/14/2018  ? Procedure: PORTA CATH INSERTION;  Surgeon: Algernon Huxley, MD;  Location: [place] CV LAB;  Service: Cardiovascular;  Laterality: N/A;  ? TONSILECTOMY, ADENOIDECTOMY, BILATERAL [place]  ? TONSILLECTOMY    ? VENTRAL HERNIA REPAIR N/A 03/01/2017  ? 10 x 14 CM Ventralight ST mesh, intraperitoneal location.   ? VENTRAL HERNIA REPAIR N/A 03/01/2017  ? Procedure: HERNIA REPAIR VENTRAL ADULT;  Surgeon: Robert Bellow, MD;  Location: [facility] ORS;  Service: General;  Laterality: N/A;  ? ? ?Social History  ? ?Socioeconomic History  ? Marital status: Married  ?  Spouse name: [name]  ? Number of children: Not on file  ? Years of education: Not on file  ? Highest education level: Not on file  ?Occupational History  ? Not on file  ?Tobacco Use  ? Smoking status: Former  ?  Packs/day: 2.00  ?  Years: 50.00  ?  Pack years: 100.00  ?  Types: Cigarettes, E-cigarettes  ?  Quit date: 02/07/2012  ?  Years since quitting: 9.3  ? Smokeless tobacco: Never  ?Vaping Use  ? Vaping Use: Some days  ? Start date: 10/06/2013  ? Devices: uses no liquid  ?Substance and Sexual Activity  ? Alcohol use: No  ?  Alcohol/week: 0.0 standard drinks  ? Drug use: No  ? Sexual activity: Yes  ?Other Topics Concern  ? Not on file  ?Social History Narrative  ? Married  ? Retired  ? High School-highest level of education   ? No children   ? 1 cup of coffee  ? ?Social Determinants of  Health  ? ?Financial Resource Strain: Low Risk   ? Difficulty of Paying Living Expenses: Not hard at all  ?Food Insecurity: No Food Insecurity  ? Worried About Charity fundraiser in the Last Year: Never true  ? Ran Out of Food in the Last Year: Never true  ?Transportation Needs: No Transportation Needs  ? Lack of Transportation (Medical): No  ? Lack of Transportation (Non-Medical): No  ?Physical Activity: Not on file  ?Stress: No Stress Concern Present  ? Feeling of Stress : Not at all  ?Social Connections: Unknown  ? Frequency of Communication with Friends and Family: More than three times a week  ? Frequency of Social Gatherings with Friends and Family: Not on file  ? Attends Religious Services: Not on file  ? Active Member of Clubs or Organizations: Not on file  ? Attends Archivist Meetings: Not on file  ? Marital Status: Married  ?Intimate Partner Violence: Not At Risk  ? Fear of Current or Ex-Partner: No  ? Emotionally Abused: No  ? Physically Abused: No  ? Sexually Abused: No  ? ? ?Family History  ?Problem Relation Age of Onset  ?  Hypertension Mother   ? Ovarian cancer Mother 56  ? Heart disease Father   ? Stroke Father   ? Ovarian cancer Sister   ?     ? dx cancer had hyst.  ? Breast cancer Neg Hx   ? ? ? ?Current Outpatient Medications:  ?  amLODipine (NORVASC) 5 MG tablet, Take 1 tablet (5 mg total) by mouth daily., Disp: 90 tablet, Rfl: 3 ?  buPROPion (WELLBUTRIN XL) 300 MG 24 hr tablet, Take 1 tablet (300 mg total) by mouth daily., Disp: 90 tablet, Rfl: 1 ?  clobetasol cream (TEMOVATE) 0.29 %, Apply 1 application topically 2 (two) times daily. Lower back, Disp: 60 g, Rfl: 1 ?  folic acid (FOLVITE) 1 MG tablet, Take 1 tablet by mouth once daily, Disp: 90 tablet, Rfl: 0 ?  ibuprofen (ADVIL,MOTRIN) 200 MG tablet, Take 400 mg by mouth every 8 (eight) hours as needed for headache or moderate pain. , Disp: , Rfl:  ?  levothyroxine (SYNTHROID) 100 MCG tablet, Take 1 tablet (100 mcg total) by mouth  daily before breakfast., Disp: 30 tablet, Rfl: 1 ?  lidocaine-prilocaine (EMLA) cream, Apply to affected area once, Disp: 30 g, Rfl: 3 ?  Multiple Vitamins-Minerals (PRESERVISION AREDS 2) CAPS, Take 1 capsu

## 2021-06-07 NOTE — Assessment & Plan Note (Signed)
She continues to have some issues with this.  This has improved slightly.  She will continue Wellbutrin 300 mg once daily.  We will increase her Zoloft to 100 mg daily.  I will follow-up with her in 6 weeks.  Recent sodium level was acceptable. ?

## 2021-06-07 NOTE — Assessment & Plan Note (Addendum)
New issue.  Undetermined cause.  Possibly related to tooth loss though I cannot rule out a stroke or an intracranial lesion given her history of lung cancer.  We will get an MRI brain to evaluate further.  She will seek medical attention immediately if she develops any new neurological symptoms or worsening slurred speech. ?

## 2021-06-07 NOTE — Progress Notes (Signed)
TSH levels increased over past few months.  Says patient on levothyroid at home -per MD patient is non compliant.  Asked patient to pick up meds -no increased in dose at this time.  ?

## 2021-06-07 NOTE — Patient Instructions (Addendum)
Nice to see you. ?We will get a scan of your head to determine if there is a cause for your slurred speech other than your teeth loss. ?We will increase your Zoloft to 100 mg daily.  Hopefully this will help with your depression. ?

## 2021-06-07 NOTE — Progress Notes (Signed)
Creatinine 1.51. Per Dr. Janese Banks okay to proceed with Tecentriq. Pt to receive 1 Liter of NS ? ?

## 2021-06-07 NOTE — Progress Notes (Signed)
?Anne Rumps, MD ?Phone: [phone] ? ?Anne [name] is a 71 y.o. female who presents today for follow-up. ? ?Anxiety/depression: Patient reports anxiety and depression.  She notes it somewhat better with the Zoloft though she still has some symptoms.  Notes its worse when she is at home by herself all day.  She notes no SI. ? ?Slurred speech: This has been going on at least since 05/20/2021.  Her grandson noted change in speech that day and he spoke with his mother who noted it was related to the patient's teeth loss.  She does wear dentures.  Her grandson notes that the speech does seem different than it was previously even with her dentures.  She notes no other neurological issues. ? ?Social History  ? ?Tobacco Use  ?Smoking Status Former  ? Packs/day: 2.00  ? Years: 50.00  ? Pack years: 100.00  ? Types: Cigarettes, E-cigarettes  ? Quit date: 02/07/2012  ? Years since quitting: 9.3  ?Smokeless Tobacco Never  ? ? ?Current Outpatient Medications on File Prior to Visit  ?Medication Sig Dispense Refill  ? amLODipine (NORVASC) 5 MG tablet Take 1 tablet (5 mg total) by mouth daily. 90 tablet 3  ? buPROPion (WELLBUTRIN XL) 300 MG 24 hr tablet Take 1 tablet (300 mg total) by mouth daily. 90 tablet 1  ? clobetasol cream (TEMOVATE) 2.63 % Apply 1 application topically 2 (two) times daily. Lower back 60 g 1  ? erythromycin ophthalmic ointment Place 1 application. into the right eye daily as needed.    ? fluticasone (FLONASE) 50 MCG/ACT nasal spray Place 2 sprays into both nostrils daily. 16 g 6  ? folic acid (FOLVITE) 1 MG tablet Take 1 tablet by mouth once daily 90 tablet 0  ? ibuprofen (ADVIL,MOTRIN) 200 MG tablet Take 400 mg by mouth every 8 (eight) hours as needed for headache or moderate pain.     ? levothyroxine (SYNTHROID) 100 MCG tablet Take 1 tablet (100 mcg total) by mouth daily before breakfast. 30 tablet 1  ? lidocaine-prilocaine (EMLA) cream Apply to affected area once 30 g 3  ? loperamide (IMODIUM) 2 MG  capsule Take 1 capsule (2 mg total) by mouth every 2 (two) hours as needed for diarrhea or loose stools. Do not exceed 16mg  daily. 30 capsule 1  ? Multiple Vitamins-Minerals (PRESERVISION AREDS 2) CAPS Take 1 capsule by mouth 2 (two) times a day.     ? ondansetron (ZOFRAN ODT) 4 MG disintegrating tablet Take 1 tablet (4 mg total) by mouth every 8 (eight) hours as needed for nausea or vomiting. 20 tablet 0  ? pantoprazole (PROTONIX) 40 MG tablet TAKE 1 TABLET BY MOUTH TWICE DAILY BEFORE A MEAL 180 tablet 0  ? polyethylene glycol (MIRALAX / GLYCOLAX) 17 g packet Take 17 g by mouth daily as needed for mild constipation. 14 each 0  ? rivastigmine (EXELON) 1.5 MG capsule Take 1 capsule (1.5 mg total) by mouth 2 (two) times daily. 60 capsule 3  ? rosuvastatin (CRESTOR) 20 MG tablet Take 1 tablet by mouth once daily 90 tablet 0  ? ?Current Facility-Administered Medications on File Prior to Visit  ?Medication Dose Route Frequency Provider Last Rate Last Admin  ? denosumab (XGEVA) injection 120 mg  120 mg Subcutaneous Q30 days Sindy Guadeloupe, MD   120 mg at 03/05/19 1525  ? ? ? ?ROS see history of present illness ? ?Objective ? ?Physical Exam ?Vitals:  ? 06/07/21 1213  ?BP: 130/80  ?Pulse: 71  ?  Temp: 98.1 ?F (36.7 ?C)  ?SpO2: 94%  ? ? ?BP Readings from Last 3 Encounters:  ?06/07/21 130/80  ?06/07/21 (!) 156/98  ?05/17/21 132/86  ? ?Wt Readings from Last 3 Encounters:  ?06/07/21 182 lb 12.8 oz (82.9 kg)  ?06/07/21 179 lb 12.8 oz (81.6 kg)  ?05/17/21 184 lb 4.8 oz (83.6 kg)  ? ? ?Physical Exam ?Constitutional:   ?   General: She is not in acute distress. ?   Appearance: She is not diaphoretic.  ?Cardiovascular:  ?   Rate and Rhythm: Normal rate and regular rhythm.  ?   Heart sounds: Normal heart sounds.  ?Pulmonary:  ?   Effort: Pulmonary effort is normal.  ?   Breath sounds: Normal breath sounds.  ?Skin: ?   General: Skin is warm and dry.  ?Neurological:  ?   Mental Status: She is alert.  ?   Comments: Hearing slightly  diminished to finger rub bilaterally, slurred speech noted, otherwise CN 3-12 intact, 5/5 strength in bilateral biceps, triceps, grip, quads, hamstrings, plantar and dorsiflexion, sensation to light touch intact in bilateral UE and LE  ? ? ? ?Assessment/Plan: Please see individual problem list. ? ?Problem List Items Addressed This Visit   ? ? Depression  ?  She continues to have some issues with this.  This has improved slightly.  She will continue Wellbutrin 300 mg once daily.  We will increase her Zoloft to 100 mg daily.  I will follow-up with her in 6 weeks.  Recent sodium level was acceptable. ? ?  ?  ? Relevant Medications  ? sertraline (ZOLOFT) 100 MG tablet  ? Slurred speech - Primary  ?  New issue.  Undetermined cause.  Possibly related to tooth loss though I cannot rule out a stroke or an intracranial lesion given her history of lung cancer.  We will get an MRI brain to evaluate further.  She will seek medical attention immediately if she develops any new neurological symptoms or worsening slurred speech. ? ?  ?  ? Relevant Orders  ? MR Brain W Wo Contrast  ? ? ?Return in about 6 weeks (around 07/19/2021) for Depression. ? ? ?Anne Rumps, MD ?[facility] ? ?

## 2021-06-13 ENCOUNTER — Encounter: Payer: Self-pay | Admitting: Oncology

## 2021-06-16 ENCOUNTER — Ambulatory Visit (INDEPENDENT_AMBULATORY_CARE_PROVIDER_SITE_OTHER): Payer: PPO | Admitting: Family Medicine

## 2021-06-16 ENCOUNTER — Telehealth: Payer: Self-pay | Admitting: *Deleted

## 2021-06-16 ENCOUNTER — Inpatient Hospital Stay: Payer: PPO

## 2021-06-16 ENCOUNTER — Telehealth: Payer: Self-pay

## 2021-06-16 ENCOUNTER — Encounter: Payer: Self-pay | Admitting: Family Medicine

## 2021-06-16 ENCOUNTER — Other Ambulatory Visit: Payer: Self-pay | Admitting: Family Medicine

## 2021-06-16 ENCOUNTER — Other Ambulatory Visit: Payer: Self-pay | Admitting: Oncology

## 2021-06-16 VITALS — BP 120/80 | HR 68 | Temp 98.8°F | Ht 66.0 in | Wt 182.0 lb

## 2021-06-16 VITALS — BP 136/88 | HR 66 | Temp 97.0°F | Resp 16

## 2021-06-16 DIAGNOSIS — C3411 Malignant neoplasm of upper lobe, right bronchus or lung: Secondary | ICD-10-CM | POA: Diagnosis not present

## 2021-06-16 DIAGNOSIS — M5440 Lumbago with sciatica, unspecified side: Secondary | ICD-10-CM

## 2021-06-16 DIAGNOSIS — Z5112 Encounter for antineoplastic immunotherapy: Secondary | ICD-10-CM | POA: Diagnosis not present

## 2021-06-16 DIAGNOSIS — T148XXA Other injury of unspecified body region, initial encounter: Secondary | ICD-10-CM

## 2021-06-16 DIAGNOSIS — R4189 Other symptoms and signs involving cognitive functions and awareness: Secondary | ICD-10-CM | POA: Diagnosis not present

## 2021-06-16 DIAGNOSIS — F331 Major depressive disorder, recurrent, moderate: Secondary | ICD-10-CM | POA: Diagnosis not present

## 2021-06-16 DIAGNOSIS — Z95828 Presence of other vascular implants and grafts: Secondary | ICD-10-CM

## 2021-06-16 DIAGNOSIS — G8929 Other chronic pain: Secondary | ICD-10-CM

## 2021-06-16 LAB — COMPREHENSIVE METABOLIC PANEL
ALT: 101 U/L — ABNORMAL HIGH (ref 0–44)
AST: 142 U/L — ABNORMAL HIGH (ref 15–41)
Albumin: 4.3 g/dL (ref 3.5–5.0)
Alkaline Phosphatase: 109 U/L (ref 38–126)
Anion gap: 12 (ref 5–15)
BUN: 15 mg/dL (ref 8–23)
CO2: 28 mmol/L (ref 22–32)
Calcium: 9.2 mg/dL (ref 8.9–10.3)
Chloride: 97 mmol/L — ABNORMAL LOW (ref 98–111)
Creatinine, Ser: 1.56 mg/dL — ABNORMAL HIGH (ref 0.44–1.00)
GFR, Estimated: 35 mL/min — ABNORMAL LOW (ref >60)
Glucose, Bld: 86 mg/dL (ref 70–99)
Potassium: 3.4 mmol/L — ABNORMAL LOW (ref 3.5–5.1)
Sodium: 137 mmol/L (ref 135–145)
Total Bilirubin: 0.9 mg/dL (ref 0.3–1.2)
Total Protein: 8 g/dL (ref 6.5–8.1)

## 2021-06-16 MED ORDER — SODIUM CHLORIDE 0.9 % IV SOLN
INTRAVENOUS | Status: AC
  Filled 2021-06-16: qty 250

## 2021-06-16 MED ORDER — HEPARIN SOD (PORK) LOCK FLUSH 100 UNIT/ML IV SOLN
500.0000 [IU] | Freq: Once | INTRAVENOUS | Status: AC
  Administered 2021-06-16: 500 [IU] via INTRAVENOUS
  Filled 2021-06-16: qty 5

## 2021-06-16 MED ORDER — SODIUM CHLORIDE 0.9% FLUSH
10.0000 mL | Freq: Once | INTRAVENOUS | Status: AC
  Administered 2021-06-16: 10 mL via INTRAVENOUS
  Filled 2021-06-16: qty 10

## 2021-06-16 NOTE — Assessment & Plan Note (Signed)
Suspect related to bumping into things intermittently.  Recent CBC was without cause for bruising. ?

## 2021-06-16 NOTE — Progress Notes (Signed)
?Tommi Rumps, MD ?Phone: [phone] ? ?Anne [name] is a 71 y.o. female who presents today for f/u. ? ?Depression/cognitive impairment: Patient presents with her husband today.  They both note that her memory has progressively worsened.  Her depression has worsened as well.  I saw her 2 weeks ago for her depression and we increased her Zoloft.  Her husband reports that the patient may not be taking all of her medications on a daily basis.  He tries to get her to take them though there are days where she misses.  She notes she has wished she would be dead for the past 2 years.  She notes no plan or intent to harm herself.  Her husband notes she is tired of going to the cancer center.  Today she reports she does want to continue with cancer treatments.  She previously saw neurology for her memory issues and they felt as though her symptoms were related to a mix of depression, cognitive impairment, and brain fog from her chemotherapy.  They started her on rivastigmine. ? ?Low back pain: Patient notes this has been going on for a while.  It occurs in her right low back.  They use Biofreeze on it. ? ?Bruising: Patient ends up with a bruise here they are on her arms or legs.  She and her husband both deny any injury.  She notes no bruising over her torso. ? ?Social History  ? ?Tobacco Use  ?Smoking Status Former  ? Packs/day: 2.00  ? Years: 50.00  ? Pack years: 100.00  ? Types: Cigarettes, E-cigarettes  ? Quit date: 02/07/2012  ? Years since quitting: 9.3  ?Smokeless Tobacco Never  ? ? ?Current Outpatient Medications on File Prior to Visit  ?Medication Sig Dispense Refill  ? amLODipine (NORVASC) 5 MG tablet Take 1 tablet (5 mg total) by mouth daily. 90 tablet 3  ? buPROPion (WELLBUTRIN XL) 300 MG 24 hr tablet Take 1 tablet (300 mg total) by mouth daily. 90 tablet 1  ? clobetasol cream (TEMOVATE) 0.98 % Apply 1 application topically 2 (two) times daily. Lower back 60 g 1  ? erythromycin ophthalmic ointment Place 1  application. into the right eye daily as needed.    ? fluticasone (FLONASE) 50 MCG/ACT nasal spray Place 2 sprays into both nostrils daily. 16 g 6  ? folic acid (FOLVITE) 1 MG tablet Take 1 tablet by mouth once daily 90 tablet 0  ? ibuprofen (ADVIL,MOTRIN) 200 MG tablet Take 400 mg by mouth every 8 (eight) hours as needed for headache or moderate pain.     ? levothyroxine (SYNTHROID) 100 MCG tablet Take 1 tablet (100 mcg total) by mouth daily before breakfast. 30 tablet 1  ? lidocaine-prilocaine (EMLA) cream Apply to affected area once 30 g 3  ? loperamide (IMODIUM) 2 MG capsule Take 1 capsule (2 mg total) by mouth every 2 (two) hours as needed for diarrhea or loose stools. Do not exceed 16mg  daily. 30 capsule 1  ? Multiple Vitamins-Minerals (PRESERVISION AREDS 2) CAPS Take 1 capsule by mouth 2 (two) times a day.     ? ondansetron (ZOFRAN ODT) 4 MG disintegrating tablet Take 1 tablet (4 mg total) by mouth every 8 (eight) hours as needed for nausea or vomiting. 20 tablet 0  ? pantoprazole (PROTONIX) 40 MG tablet TAKE 1 TABLET BY MOUTH TWICE DAILY BEFORE A MEAL 180 tablet 0  ? polyethylene glycol (MIRALAX / GLYCOLAX) 17 g packet Take 17 g by mouth daily as  needed for mild constipation. 14 each 0  ? rosuvastatin (CRESTOR) 20 MG tablet Take 1 tablet by mouth once daily 90 tablet 0  ? sertraline (ZOLOFT) 100 MG tablet Take 1 tablet (100 mg total) by mouth daily. 90 tablet 1  ? rivastigmine (EXELON) 1.5 MG capsule Take 1 capsule (1.5 mg total) by mouth 2 (two) times daily. 60 capsule 3  ? ?Current Facility-Administered Medications on File Prior to Visit  ?Medication Dose Route Frequency Provider Last Rate Last Admin  ? denosumab (XGEVA) injection 120 mg  120 mg Subcutaneous Q30 days Sindy Guadeloupe, MD   120 mg at 03/05/19 1525  ? ? ? ?ROS see history of present illness ? ?Objective ? ?Physical Exam ?Vitals:  ? 06/16/21 0847  ?BP: 120/80  ?Pulse: 68  ?Temp: 98.8 ?F (37.1 ?C)  ?SpO2: 90%  ? ? ?BP Readings from Last 3  Encounters:  ?06/16/21 120/80  ?06/07/21 130/80  ?06/07/21 (!) 156/98  ? ?Wt Readings from Last 3 Encounters:  ?06/16/21 182 lb (82.6 kg)  ?06/07/21 182 lb 12.8 oz (82.9 kg)  ?06/07/21 179 lb 12.8 oz (81.6 kg)  ? ? ?Physical Exam ?Constitutional:   ?   General: She is not in acute distress. ?   Appearance: She is not diaphoretic.  ?Cardiovascular:  ?   Rate and Rhythm: Normal rate and regular rhythm.  ?   Heart sounds: Normal heart sounds.  ?Pulmonary:  ?   Effort: Pulmonary effort is normal.  ?   Breath sounds: Normal breath sounds.  ?Musculoskeletal:  ?   Comments: No midline spine tenderness, no midline spine step-off, there is muscular lumbar tenderness  ?Skin: ?   General: Skin is warm and dry.  ?   Comments: Small bruise left lateral upper arm  ?Neurological:  ?   Mental Status: She is alert.  ?   Comments: 5/5 strength bilateral quads, hamstrings, plantarflexion, and dorsiflexion, sensation light touch intact bilateral lower extremities  ? ? ? ?Assessment/Plan: Please see individual problem list. ? ?Problem List Items Addressed This Visit   ? ? Cognitive impairment (Chronic)  ?  Likely related to a mix of depression, cognitive impairment, and brain fog from her chemotherapy.  There may be some issues with her consistently taking her medications.  We will see if the social worker has any resources to help with this.  She will continue her rivastigmine through neurology.  She does have a upcoming MRI brain that will help evaluate for any underlying structural cause of this issue. ? ?  ?  ? Relevant Orders  ? AMB Referral to [facility]  ? Depression - Primary (Chronic)  ?  This continues to be an issue.  Today she endorses some passive suicidal ideation.  This has apparently been an ongoing issue.  Discussed that we just made changes to her regimen and I would not expect them to have made much of a difference yet.  She will continue Wellbutrin 300 mg daily and Zoloft 100 mg daily.  We will try to  get her set up with a social worker to help with resources for home care as well as provide some counseling if they deem appropriate.  Discussed if they are not able to do appropriate counseling we could refer her to a specific therapist.  She was advised to seek medical attention immediately if she developed suicidal plan or intent.  She was advised to contact us if her depression worsens at all.  I will see  her back in 3 weeks. ? ?  ?  ? Relevant Orders  ? AMB Referral to [facility]  ? Bruising  ?  Suspect related to bumping into things intermittently.  Recent CBC was without cause for bruising. ? ?  ?  ? Low back pain  ?  Likely related to muscular strain.  Discussed use of ice or heat for 10 minutes at a time.  Also discussed use of Tylenol for any pain.  She has an upcoming PET scan that will let us know if there is any cancerous process in this area. ? ?  ?  ? Small cell lung cancer, right upper lobe ([place])  ? Relevant Orders  ? AMB Referral to [facility]  ? ? ?Return in about 3 weeks (around 07/07/2021) for Depression, please cancel the appointment for 6/14. ? ? ?Tommi Rumps, MD ?[facility] ? ?

## 2021-06-16 NOTE — Assessment & Plan Note (Addendum)
Likely related to a mix of depression, cognitive impairment, and brain fog from her chemotherapy.  There may be some issues with her consistently taking her medications.  We will see if the social worker has any resources to help with this.  She will continue her rivastigmine through neurology.  She does have a upcoming MRI brain that will help evaluate for any underlying structural cause of this issue. ?

## 2021-06-16 NOTE — Assessment & Plan Note (Signed)
This continues to be an issue.  Today she endorses some passive suicidal ideation.  This has apparently been an ongoing issue.  Discussed that we just made changes to her regimen and I would not expect them to have made much of a difference yet.  She will continue Wellbutrin 300 mg daily and Zoloft 100 mg daily.  We will try to get her set up with a social worker to help with resources for home care as well as provide some counseling if they deem appropriate.  Discussed if they are not able to do appropriate counseling we could refer her to a specific therapist.  She was advised to seek medical attention immediately if she developed suicidal plan or intent.  She was advised to contact us if her depression worsens at all.  I will see her back in 3 weeks. ?

## 2021-06-16 NOTE — Patient Instructions (Addendum)
Nice to see you. ?Please make sure you are taking Wellbutrin 300 mg daily and Zoloft 100 mg daily.  Please make sure you consistently take your medication.  A social worker should contact you for some counseling as well as to go over potential resources that are available. ?You can use heat or ice for your back.  You can also take Tylenol over-the-counter for this. ?If you develop any plan or intent to harm yourself you need to seek medical attention in the emergency department. ?

## 2021-06-16 NOTE — Assessment & Plan Note (Signed)
Likely related to muscular strain.  Discussed use of ice or heat for 10 minutes at a time.  Also discussed use of Tylenol for any pain.  She has an upcoming PET scan that will let us know if there is any cancerous process in this area. ?

## 2021-06-16 NOTE — Chronic Care Management (AMB) (Signed)
  Chronic Care Management   Outreach Note  06/16/2021 Name: Anne [name] MRN: [id number] DOB: [birth year]  Anne [name] is a 71 y.o. year old female who is a primary care patient of Leone Haven, MD. I reached out to [place] by phone today in response to a referral sent by [name]'s primary care provider.  An unsuccessful telephone outreach was attempted today. The patient was referred to the case management team for assistance with care management and care coordination.   Follow Up Plan: A HIPAA compliant phone message was left for the patient providing contact information and requesting a return call.  The care management team will reach out to the patient again over the next 3 days.  If patient returns call to provider office, please advise to call [place] * at [phone]*  Noreene Larsson, [place], [place] [facility]  [place], [place] [address] Direct Dial: [phone] [email or web address] Website: [email or web address]

## 2021-06-16 NOTE — Telephone Encounter (Signed)
Got a message from Dr. Janese Banks that Anne [name]'s liver enzymes are going up.  She is talking to Dr. Vicente Males who had taken care of her before in GI area,.  Dr. Vicente Males suggested that the patient come over and get checked in to see [place] and the appointment has been made for May 17 at 315 at the [facility] specialty building that is on property at [place].  I could not get the patient on the phone nor the husband so I did leave a message with the husband that her liver enzymes were going up and she will be needing to see a GI specialist who can see her on May 17 at 315 as well as until we know what is going on with the liver enzymes we will not be giving her any more treatment at this time.  I have asked for the patient to call us back or her husband call us back to make sure they get this message and do not have any questions or concerns about it ?

## 2021-06-19 ENCOUNTER — Telehealth: Payer: Self-pay

## 2021-06-19 NOTE — Chronic Care Management (AMB) (Signed)
  Chronic Care Management   Outreach Note  06/19/2021 Name: [name] MRN: [id number] DOB: 06/17/1950  Anne [name] is a 71 y.o. year old female who is a primary care patient of Leone Haven, MD. I reached out to [place] by phone today in response to a referral sent by Ms. Carvel Getting [place]'s primary care provider.  A second unsuccessful telephone outreach was attempted today. The patient was referred to the case management team for assistance with care management and care coordination.   Follow Up Plan: A HIPAA compliant phone message was left for the patient providing contact information and requesting a return call.  The care management team will reach out to the patient again over the next 7 days.  If patient returns call to provider office, please advise to call [place] * at [phone]*  Anne [name], [place], [place] [facility]  [place], [place] [address] Direct Dial: [phone] [email or web address] Website: [email or web address]

## 2021-06-19 NOTE — Telephone Encounter (Signed)
Reached out to pts daughter regarding future GI appointment due to not being able to contact pt; pts daughter states she has a MRI that morning but son will be bringing son to appt. Also, made her aware that Noreene Larsson has been trying to contact pt as well since last week with no success. Provided daughter with Amber's contact infomration and she stated she will reach out to her in regards to Care Coordination. Pts daughter understands about future GI appointment along with contacting Amber.  ? ?

## 2021-06-20 ENCOUNTER — Other Ambulatory Visit: Payer: Self-pay

## 2021-06-21 ENCOUNTER — Encounter: Payer: Self-pay | Admitting: Gastroenterology

## 2021-06-21 ENCOUNTER — Other Ambulatory Visit: Payer: Self-pay

## 2021-06-21 ENCOUNTER — Ambulatory Visit (INDEPENDENT_AMBULATORY_CARE_PROVIDER_SITE_OTHER): Payer: PPO | Admitting: Gastroenterology

## 2021-06-21 ENCOUNTER — Ambulatory Visit
Admission: RE | Admit: 2021-06-21 | Discharge: 2021-06-21 | Disposition: A | Discharge status: Home / Self Care | Payer: PPO | Source: Ambulatory Visit | Attending: Family Medicine | Admitting: Family Medicine

## 2021-06-21 DIAGNOSIS — K7460 Unspecified cirrhosis of liver: Secondary | ICD-10-CM | POA: Diagnosis not present

## 2021-06-21 DIAGNOSIS — Z9289 Personal history of other medical treatment: Secondary | ICD-10-CM

## 2021-06-21 DIAGNOSIS — R7989 Other specified abnormal findings of blood chemistry: Secondary | ICD-10-CM

## 2021-06-21 DIAGNOSIS — R4781 Slurred speech: Secondary | ICD-10-CM | POA: Diagnosis not present

## 2021-06-21 MED ORDER — GADOBENATE DIMEGLUMINE 529 MG/ML IV SOLN: 8.0000 mL | Freq: Once | INTRAVENOUS | Status: DC | PRN

## 2021-06-21 MED ORDER — GADOBUTROL 1 MMOL/ML IV SOLN
8.0000 mL | Freq: Once | INTRAVENOUS | Status: AC | PRN
  Administered 2021-06-21: 8 mL via INTRAVENOUS

## 2021-06-21 NOTE — Progress Notes (Addendum)
Anne Bellows MD, MRCP(U.K) [address]  [place], [place] [address]  Main: [phone]  Fax: [phone]   Gastroenterology Consultation  Referring Provider:     Leone Haven, MD Primary Care Physician:  Leone Haven, MD Primary Gastroenterologist:  Dr. Jonathon [name]  Reason for Consultation:    Abnormal LFT's   HPI:   Anne [name] is a 71 y.o. y/o female referred for consultation & management  by Dr. Caryl Bis, Angela Adam, MD.    She is a patient who used see Dr. Bonna Gains last seen back in 2019.  Had previously performed a colonoscopy on him and tubular adenomas were resected and a repeat colonoscopy recommended in 5 years.  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  Did not follow-up subsequently.   I was contacted by Dr. 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  On 06/16/2021 found to have elevation of AST ALT from 9742.  It has been elevated since February 2023.  She denied any knowledge of cirrhosis of the liver.  She denied knowing who her cancer doctor was.  While she said was she had some back pain.  Denies any over-the-counter medications excess Tylenol new medications that she has started.     Latest Ref Rng & Units 06/16/2021    9:52 AM 06/07/2021    8:12 AM 05/17/2021    8:58 AM  Hepatic Function  Total Protein 6.5 - 8.1 g/dL 8.0   8.2   7.8    Albumin 3.5 - 5.0 g/dL 4.3   4.4   4.1    AST 15 - 41 U/L 142   97   115    ALT 0 - 44 U/L 101   65   67    Alk Phosphatase 38 - 126 U/L 109   96   96    Total Bilirubin 0.3 - 1.2 mg/dL 0.9   1.1   0.6      Past Medical History:  Diagnosis Date   Arthritis    Cirrhosis ([place])    Depression    GERD (gastroesophageal reflux disease)    Hyperlipidemia    Hypertension    Lung cancer ([place])     Metastatic bone cancer     Past Surgical History:  Procedure Laterality Date   APPENDECTOMY  1971   CHOLECYSTECTOMY  1971   COLONOSCOPY WITH PROPOFOL N/A 11/15/2016   Procedure: COLONOSCOPY WITH PROPOFOL;  Surgeon: Anne Bellows, MD;  Location: [facility] ENDOSCOPY;  Service: Gastroenterology;  Laterality: N/A;   ENDOBRONCHIAL ULTRASOUND Right 07/30/2018   Procedure: ENDOBRONCHIAL ULTRASOUND;  Surgeon: Laverle Hobby, MD;  Location: [facility] ORS;  Service: Pulmonary;  Laterality: Right;   ESOPHAGOGASTRODUODENOSCOPY (EGD) WITH PROPOFOL N/A 01/07/2017   Procedure: ESOPHAGOGASTRODUODENOSCOPY (EGD) WITH PROPOFOL;  Surgeon: Anne Bellows, MD;  Location: [facility] ENDOSCOPY;  Service: Gastroenterology;  Laterality: N/A;   LAPAROSCOPY N/A 03/01/2017   Procedure: LAPAROSCOPY DIAGNOSTIC;  Surgeon: Robert Bellow, MD;  Location: [facility] ORS;  Service: General;  Laterality: N/A;   PORTA CATH INSERTION N/A 08/14/2018   Procedure: PORTA CATH INSERTION;  Surgeon: Algernon Huxley, MD;  Location: [facility];  Service: Cardiovascular;  Laterality: N/A;   TONSILECTOMY, ADENOIDECTOMY, BILATERAL MYRINGOTOMY [place]  HERNIA REPAIR N/A 03/01/2017   10 x 14 CM Ventralight ST mesh, intraperitoneal location.    VENTRAL HERNIA REPAIR N/A 03/01/2017   Procedure: HERNIA REPAIR VENTRAL ADULT;  Surgeon: Robert Bellow, MD;  Location: [facility] ORS;  Service: General;  Laterality: N/A;    Prior to Admission medications   Medication Sig Start Date End Date Taking? Authorizing Provider  amLODipine (NORVASC) 5 MG tablet Take 1 tablet (5 mg total) by mouth daily. 01/26/20   Leone Haven, MD  buPROPion (WELLBUTRIN XL) 300 MG 24 hr tablet Take 1 tablet (300 mg total) by mouth daily. 01/23/21   Leone Haven, MD  clobetasol cream (TEMOVATE) 2.22 % Apply 1 application topically 2 (two) times daily. Lower back 07/15/20   McLean-Scocuzza, Nino Glow, MD  erythromycin ophthalmic ointment Place 1  application. into the right eye daily as needed. 01/11/20   [provider]  fluticasone (FLONASE) 50 MCG/ACT nasal spray Place 2 sprays into both nostrils daily. 04/26/20   Leone Haven, MD  folic acid (FOLVITE) 1 MG tablet Take 1 tablet by mouth once daily 06/13/21   Sindy Guadeloupe, MD  ibuprofen (ADVIL,MOTRIN) 200 MG tablet Take 400 mg by mouth every 8 (eight) hours as needed for headache or moderate pain.     [provider]  levothyroxine (SYNTHROID) 100 MCG tablet Take 1 tablet (100 mcg total) by mouth daily before breakfast. 05/30/21   Sindy Guadeloupe, MD  lidocaine-prilocaine (EMLA) cream Apply to affected area once 08/06/18   Sindy Guadeloupe, MD  loperamide (IMODIUM) 2 MG capsule Take 1 capsule (2 mg total) by mouth every 2 (two) hours as needed for diarrhea or loose stools. Do not exceed 64m daily. 08/28/18   RSindy Guadeloupe MD  Multiple Vitamins-Minerals (PRESERVISION AREDS 2) CAPS Take 1 capsule by mouth 2 (two) times a day.     [provider]  ondansetron (ZOFRAN ODT) 4 MG disintegrating tablet Take 1 tablet (4 mg total) by mouth every 8 (eight) hours as needed for nausea or vomiting. 02/19/20   BLoura HaltA, NP  pantoprazole (PROTONIX) 40 MG tablet TAKE 1 TABLET BY MOUTH TWICE DAILY BEFORE A MEAL 07/05/20   SLeone Haven MD  polyethylene glycol (MIRALAX / GLYCOLAX) 17 g packet Take 17 g by mouth daily as needed for mild constipation. 11/03/18   KEpifanio Lesches MD  rivastigmine (EXELON) 1.5 MG capsule Take 1 capsule (1.5 mg total) by mouth 2 (two) times daily. 01/19/21 06/07/21  CAlric Ran MD  rosuvastatin (CRESTOR) 20 MG tablet Take 1 tablet by mouth once daily 06/19/21   SLeone Haven MD  sertraline (ZOLOFT) 100 MG tablet Take 1 tablet (100 mg total) by mouth daily. 06/07/21   SLeone Haven MD    Family History  Problem Relation Age of Onset   Hypertension Mother    Ovarian cancer Mother 626  Heart disease Father    Stroke Father     Ovarian cancer Sister        ? dx cancer had hyst.   Breast cancer Neg Hx      Social History   Tobacco Use   Smoking status: Former    Packs/day: 2.00    Years: 50.00    Pack years: 100.00    Types: Cigarettes, E-cigarettes    Quit date: 02/07/2012    Years since quitting: 9.3   Smokeless tobacco: Never  Vaping Use   Vaping Use: Some days   Start  date: 10/06/2013   Devices: uses no liquid  Substance Use Topics   Alcohol use: No    Alcohol/week: 0.0 standard drinks   Drug use: No    Allergies as of 06/21/2021 - Review Complete 06/21/2021  Allergen Reaction Noted   Bee venom Swelling 03/01/2017    Review of Systems:    All systems reviewed and negative except where noted in HPI.   Physical Exam:  There were no vitals taken for this visit. No LMP recorded. Patient is postmenopausal. Psych:  Alert and cooperative. Appears confused General:   Alert,   Head:  Normocephalic and atraumatic. Eyes:  Sclera clear, no icterus.   Conjunctiva pink. Ears:  Normal auditory acuity. Psych:  Alert and cooperative. Normal mood and affect.  Imaging Studies: No results found.  Assessment and Plan:   PERIAN TEDDER is a 71 y.o. 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  Per the criteria by NCI CT CAP version 5.E hepatotoxicity criteria if she would have a classification of grade 2 hepatotoxicity.  Recommendations would be to hold immunotherapy, rule out other causes for liver disease.  Obtain imaging and I will closely monitor the LFTs for a week or 2 if does not improve or continues to rise then will commence on prednisone 1 mg/kg and will require close monitoring.  At some point depending on her overall prognosis would require an upper endoscopy to screen for esophageal varices due to history of cirrhosis of the liver.  We will also discuss health  maintenance issues including vaccination subsequently based on lab results.  Presently she appears a bit confused as per my CMA who has seen her previously at the cancer center she used to be a different person; I am concerned if she has any intracranial lesion contributing to the personality change.  I have discussed with Dr. Janese Banks in oncology to follow-up on the MRI brain and consider holding immunotherapy. 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 My concern would be that if we did have to treat her with steroids if she would be compliant .   Follow up in 2-3 weeks  Dr Anne Bellows MD,MRCP(U.K)

## 2021-06-22 ENCOUNTER — Telehealth: Payer: Self-pay | Admitting: *Deleted

## 2021-06-22 ENCOUNTER — Telehealth: Payer: Self-pay | Admitting: Family Medicine

## 2021-06-22 NOTE — Telephone Encounter (Signed)
Called and spoke to [place] this evening. Told her that she was not doing good last Friday. I spoke to her about I saw her in the  cancer center and I said that I said hello to her and she just looked at me and then  I asked her if she knew who I was. She touched by name badge and then said no. Then she asked me to tell her where the bathroom was. I showed her, she did say someone was with her to take her home.I told dr Janese Banks about it and she wanted me to check if she has a mri of brain and it was already scheduled a few days from the day I spoke to her. Dr. Janese Banks said today that to call [facility] and tell her what is going on. I told Albina Billet that she must take the thyroid pill every day . Dr. Janese Banks says this is bad and it is critical for her to take the medicine. I asked if I can call her husband but I called last week about an appt and left the message on his voicemail and he never called me back. He is truck driver and leaves early am and comes back in evening to night hours. Crystal says that she does not have his cell phone and I told her the number and she said she thinks it is right one. She also says that the husband has to wear hearing aid in order to here the telephone ringing so not sure if we will get him on the phone to tell him how important it is every day to take the thyroid pill. I am going to try tom. Evening. Albina Billet will try also

## 2021-06-22 NOTE — Telephone Encounter (Signed)
Spoke with Dr Janese Banks on this patient. She noted the patient was more confused at her recent infusion visit and she thinks this may be related to her significant hypothyroidism. She noted the patient had not filled her thyroid medication since October of 2022 when they checked with the pharmacy. She is concerned that the hypothyroidism may be contributing to her issues with confusion and is concerned that the patient could go in to myxedema coma if she is not compliant with her medications. They sent refills in for her to start back on. She wonders what we can do to help her with this. I noted that I have referred her for a Education officer, museum though it may be a good idea to get home health involved. She also wondered about palliative care to see if they could be helpful. I advised I thought that would be a good idea. Dr Janese Banks also noted that she would have her nurse contact the patients daughter to get her involved as I noted that the patients husband is not always home as he works as a Administrator.   Gae Bon, can you contact the patient and see how she is doing? Then contact her husband and daughter as listed on her DPR to see how the husband and daughter thinks she is doing with her mental status. Please also let the husband and daughter know that it seems as though the patient has not been compliant with her thyroid medication and that it is extremely important that she take this on a daily basis. Please see if they would be willing to have home health come in to evaluate her and see if they have any resources that may be helpful for her medication compliance.

## 2021-06-22 NOTE — Telephone Encounter (Signed)
I called and spoke with the patient, the daughter nor the husband was home, I asked the patient how she was doing and she stated she was ok, I informed her that I would call back when her daughter is home, which she stated would be after 4 pm.  Anne [name],cma

## 2021-06-22 NOTE — Telephone Encounter (Signed)
I called the patients daughter and LVM for the patient's daughter to call me back. Jasemine Nawaz,cma

## 2021-06-23 ENCOUNTER — Ambulatory Visit: Payer: PPO

## 2021-06-23 NOTE — Chronic Care Management (AMB) (Signed)
  Chronic Care Management   Note  06/23/2021 Name: Anne [name] MRN: [id number] DOB: 06/19/1950  Anne [name] is a 71 y.o. year old female who is a primary care patient of Leone Haven, MD. I reached out to [place] by phone today in response to a referral sent by Ms. [place] PCP.  Anne [name] was given information about Chronic Care Management services today including:  CCM service includes personalized support from designated clinical staff supervised by her physician, including individualized plan of care and coordination with other care providers 24/7 contact phone numbers for assistance for urgent and routine care needs. Service will only be billed when office clinical staff spend 20 minutes or more in a month to coordinate care. Only one practitioner may furnish and bill the service in a calendar month. The patient may stop CCM services at any time (effective at the end of the month) by phone call to the office staff. The patient is responsible for co-pay (up to 20% after annual deductible is met) if co-pay is required by the individual health plan.   Patient did not agree to enrollment in care management services and does not wish to consider at this time.  Follow up plan: Patient declines further follow up and engagement by the care management team. Appropriate care team members and provider have been notified via electronic communication.   Noreene Larsson, [place], [place], [place] [address] Direct Dial: [phone] [email or web address] Website: [email or web address]

## 2021-06-23 NOTE — Chronic Care Management (AMB) (Signed)
  Chronic Care Management   Outreach Note  06/23/2021 Name: Anne [name] MRN: [id number] DOB: 1950/08/23  Anne [name] is a 71 y.o. year old female who is a primary care patient of Leone Haven, MD. I reached out to [place] by phone today in response to a referral sent by [name]'s primary care provider.  Third unsuccessful telephone outreach was attempted today. The patient was referred to the case management team for assistance with care management and care coordination. The patient's primary care provider has been notified of our unsuccessful attempts to make or maintain contact with the patient. The care management team is pleased to engage with this patient at any time in the future should he/she be interested in assistance from the care management team.   Follow Up Plan: We have been unable to make contact with the patient for follow up. The care management team is available to follow up with the patient after provider conversation with the patient regarding recommendation for care management engagement and subsequent re-referral to the care management team.   Noreene Larsson, [place], [place], [place] [address] Direct Dial: [phone] [email or web address] Website: [email or web address]

## 2021-06-23 NOTE — Telephone Encounter (Signed)
Can you please try to call them back sometime this afternoon?  Thanks.

## 2021-06-26 NOTE — Telephone Encounter (Signed)
Noted. I will plan to follow up as scheduled in a couple of weeks.

## 2021-06-26 NOTE — Telephone Encounter (Signed)
I called and spoke with the patients daughter and asked her how the patient was doing with her mental status and she stated not very well, she can't remember anything, she also stated that she would talk with the patients husband about the thyroid medication because he is there with her in the mornings, she stated that she knows her mom will not  want home health because everything they have suggested she has said no to and she knows she would not want to do that. She gave me the patients husbands cell phone number and  he has not answered when I called, I mailed a letter out today for him to call.  Janos Shampine,cma

## 2021-06-27 ENCOUNTER — Ambulatory Visit: Admission: RE | Admit: 2021-06-27 | Payer: PPO | Source: Ambulatory Visit

## 2021-06-28 ENCOUNTER — Inpatient Hospital Stay (HOSPITAL_BASED_OUTPATIENT_CLINIC_OR_DEPARTMENT_OTHER): Payer: PPO | Admitting: Oncology

## 2021-06-28 ENCOUNTER — Inpatient Hospital Stay: Payer: PPO

## 2021-06-28 ENCOUNTER — Ambulatory Visit: Payer: PPO

## 2021-06-28 ENCOUNTER — Encounter: Payer: Self-pay | Admitting: Oncology

## 2021-06-28 VITALS — BP 154/99 | HR 68 | Temp 96.4°F | Resp 16 | Wt 178.2 lb

## 2021-06-28 DIAGNOSIS — R7989 Other specified abnormal findings of blood chemistry: Secondary | ICD-10-CM

## 2021-06-28 DIAGNOSIS — R4182 Altered mental status, unspecified: Secondary | ICD-10-CM

## 2021-06-28 DIAGNOSIS — C3411 Malignant neoplasm of upper lobe, right bronchus or lung: Secondary | ICD-10-CM | POA: Diagnosis not present

## 2021-06-28 DIAGNOSIS — E063 Autoimmune thyroiditis: Secondary | ICD-10-CM

## 2021-06-28 DIAGNOSIS — Z5112 Encounter for antineoplastic immunotherapy: Secondary | ICD-10-CM | POA: Diagnosis not present

## 2021-06-28 LAB — CBC WITH DIFFERENTIAL/PLATELET
Abs Immature Granulocytes: 0.03 10*3/uL (ref 0.00–0.07)
Basophils Absolute: 0.1 10*3/uL (ref 0.0–0.1)
Basophils Relative: 1 %
Eosinophils Absolute: 0.1 10*3/uL (ref 0.0–0.5)
Eosinophils Relative: 2 %
HCT: 35.7 % — ABNORMAL LOW (ref 36.0–46.0)
Hemoglobin: 11.6 g/dL — ABNORMAL LOW (ref 12.0–15.0)
Immature Granulocytes: 1 %
Lymphocytes Relative: 22 %
Lymphs Abs: 1.2 10*3/uL (ref 0.7–4.0)
MCH: 30.1 pg (ref 26.0–34.0)
MCHC: 32.5 g/dL (ref 30.0–36.0)
MCV: 92.5 fL (ref 80.0–100.0)
Monocytes Absolute: 0.2 10*3/uL (ref 0.1–1.0)
Monocytes Relative: 4 %
Neutro Abs: 3.7 10*3/uL (ref 1.7–7.7)
Neutrophils Relative %: 70 %
Platelets: 183 10*3/uL (ref 150–400)
RBC: 3.86 MIL/uL — ABNORMAL LOW (ref 3.87–5.11)
RDW: 17.5 % — ABNORMAL HIGH (ref 11.5–15.5)
WBC: 5.3 10*3/uL (ref 4.0–10.5)
nRBC: 0 % (ref 0.0–0.2)

## 2021-06-28 LAB — COMPREHENSIVE METABOLIC PANEL
ALT: 163 U/L — ABNORMAL HIGH (ref 0–44)
AST: 216 U/L — ABNORMAL HIGH (ref 15–41)
Albumin: 4.3 g/dL (ref 3.5–5.0)
Alkaline Phosphatase: 124 U/L (ref 38–126)
Anion gap: 10 (ref 5–15)
BUN: 14 mg/dL (ref 8–23)
CO2: 32 mmol/L (ref 22–32)
Calcium: 9.2 mg/dL (ref 8.9–10.3)
Chloride: 97 mmol/L — ABNORMAL LOW (ref 98–111)
Creatinine, Ser: 1.47 mg/dL — ABNORMAL HIGH (ref 0.44–1.00)
GFR, Estimated: 37 mL/min — ABNORMAL LOW (ref >60)
Glucose, Bld: 95 mg/dL (ref 70–99)
Potassium: 3.4 mmol/L — ABNORMAL LOW (ref 3.5–5.1)
Sodium: 139 mmol/L (ref 135–145)
Total Bilirubin: 0.8 mg/dL (ref 0.3–1.2)
Total Protein: 7.9 g/dL (ref 6.5–8.1)

## 2021-06-28 LAB — T4, FREE: Free T4: <0.25 ng/dL — ABNORMAL LOW (ref 0.61–1.12)

## 2021-06-28 LAB — TSH: TSH: 84 u[IU]/mL — ABNORMAL HIGH (ref 0.350–4.500)

## 2021-06-28 MED ORDER — LEVOTHYROXINE SODIUM 100 MCG PO TABS: 100.0000 ug | ORAL_TABLET | Freq: Every day | ORAL | 1 refills | Status: DC

## 2021-06-28 NOTE — Progress Notes (Signed)
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  She has been referred to [place] for findings of bone metastases and her recent MRI.  She has a prior history of 3 packs/day day smoking for over 45 years and quit smoking 5 years ago.She had a CT chest abdomen pelvis in 2018 which showed a 5 mm lung nodule in the left lower lobe.  Recently over the last 2 months patient has been having worsening back pain and was referred to orthopedics.  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  Pathologic fracture of S1.   PET scan showed 2 RUL lung nodules, hilar and mediastinal adenopathy and widespread bone mets. MRI brain negative.   Patient completed palliative RT to her spine. Bronchoscopy showed small cell lung cancer.  Palliative carboplatin, etoposide and Tecentriq started on 08/18/2018. Scans after 4 cycles showed stable disease. 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  She reports being compliant but her values fluctuate  widely.    Interval history-patient's confusion has been worsening over the last 2 weeks.  She denies any specific complaints at this time and is here with her husband today  ECOG PS- 1 Pain scale- 0   Review of systems- Review of Systems  Constitutional:  Negative for chills, fever, malaise/fatigue and weight loss.  HENT:  Negative for congestion, ear discharge and nosebleeds.   Eyes:  Negative for blurred vision.  Respiratory:  Negative for cough, hemoptysis, sputum production, shortness of breath and wheezing.   Cardiovascular:  Negative for chest pain, palpitations, orthopnea and claudication.  Gastrointestinal:  Negative for abdominal pain, blood in stool, constipation, diarrhea, heartburn, melena, nausea and vomiting.  Genitourinary:  Negative for dysuria, flank pain, frequency, hematuria and urgency.  Musculoskeletal:  Negative for back pain, joint pain and myalgias.  Skin:  Negative for rash.  Neurological:  Negative for dizziness, tingling, focal weakness, seizures, weakness and headaches.  Endo/Heme/Allergies:  Does not bruise/bleed easily.  Psychiatric/Behavioral:  Negative for depression and suicidal ideas. The patient does not have insomnia.        Confusion     Allergies  Allergen Reactions   Bee Venom Swelling     Past Medical History:  Diagnosis Date   Arthritis    Cirrhosis ([place])    Depression    GERD (gastroesophageal reflux disease)    Hyperlipidemia    Hypertension    Lung cancer ([place])    Metastatic bone cancer      Past Surgical History:  Procedure Laterality Date   APPENDECTOMY  1971   CHOLECYSTECTOMY  1971   COLONOSCOPY WITH PROPOFOL N/A 11/15/2016   Procedure:  COLONOSCOPY WITH PROPOFOL;  Surgeon: Jonathon Bellows, MD;  Location: [facility] ENDOSCOPY;  Service: Gastroenterology;  Laterality: N/A;   ENDOBRONCHIAL ULTRASOUND Right 07/30/2018   Procedure: ENDOBRONCHIAL ULTRASOUND;  Surgeon: Laverle Hobby, MD;  Location: [facility] ORS;  Service: Pulmonary;   Laterality: Right;   ESOPHAGOGASTRODUODENOSCOPY (EGD) WITH PROPOFOL N/A 01/07/2017   Procedure: ESOPHAGOGASTRODUODENOSCOPY (EGD) WITH PROPOFOL;  Surgeon: Jonathon Bellows, MD;  Location: [facility] ENDOSCOPY;  Service: Gastroenterology;  Laterality: N/A;   LAPAROSCOPY N/A 03/01/2017   Procedure: LAPAROSCOPY DIAGNOSTIC;  Surgeon: Robert Bellow, MD;  Location: [facility] ORS;  Service: General;  Laterality: N/A;   PORTA CATH INSERTION N/A 08/14/2018   Procedure: PORTA CATH INSERTION;  Surgeon: Algernon Huxley, MD;  Location: [place] CV LAB;  Service: Cardiovascular;  Laterality: N/A;   TONSILECTOMY, ADENOIDECTOMY, BILATERAL MYRINGOTOMY AND TUBES  1955   TONSILLECTOMY     VENTRAL HERNIA REPAIR N/A 03/01/2017   10 x 14 CM Ventralight ST mesh, intraperitoneal location.    VENTRAL HERNIA REPAIR N/A 03/01/2017   Procedure: HERNIA REPAIR VENTRAL ADULT;  Surgeon: Robert Bellow, MD;  Location: [facility] ORS;  Service: General;  Laterality: N/A;    Social History   Socioeconomic History   Marital status: Married    Spouse name: Johnny   Number of children: Not on file   Years of education: Not on file   Highest education level: Not on file  Occupational History   Not on file  Tobacco Use   Smoking status: Former    Packs/day: 2.00    Years: 50.00    Pack years: 100.00    Types: Cigarettes, E-cigarettes    Quit date: 02/07/2012    Years since quitting: 9.3   Smokeless tobacco: Never  Vaping Use   Vaping Use: Some days   Start date: 10/06/2013   Devices: uses no liquid  Substance and Sexual Activity   Alcohol use: No    Alcohol/week: 0.0 standard drinks   Drug use: No   Sexual activity: Yes  Other Topics Concern   Not on file  Social History Narrative   Married   Retired   Clinical cytogeneticist level of education    No children    1 cup of coffee   Social Determinants of Radio broadcast assistant Strain: Low Risk    Difficulty of Paying Living Expenses: Not hard at all  Food Insecurity: No  Food Insecurity   Worried About Charity fundraiser in the Last Year: Never true   Arboriculturist in the Last Year: Never true  Transportation Needs: No Transportation Needs   Lack of Transportation (Medical): No   Lack of Transportation (Non-Medical): No  Physical Activity: Not on file  Stress: No Stress Concern Present   Feeling of Stress : Not at all  Social Connections: Unknown   Frequency of Communication with Friends and Family: More than three times a week   Frequency of Social Gatherings with Friends and Family: Not on file   Attends Religious Services: Not on file   Active Member of Clubs or Organizations: Not on file   Attends Archivist Meetings: Not on file   Marital Status: Married  Human resources officer Violence: Not At Risk   Fear of Current or Ex-Partner: No   Emotionally Abused: No   Physically Abused: No   Sexually Abused: No    Family History  Problem Relation Age of Onset   Hypertension Mother    Ovarian cancer Mother 52  Heart disease Father    Stroke Father    Ovarian cancer Sister        ? dx cancer had hyst.   Breast cancer Neg Hx      Current Outpatient Medications:    amLODipine (NORVASC) 5 MG tablet, Take 1 tablet (5 mg total) by mouth daily., Disp: 90 tablet, Rfl: 3   buPROPion (WELLBUTRIN XL) 300 MG 24 hr tablet, Take 1 tablet (300 mg total) by mouth daily., Disp: 90 tablet, Rfl: 1   clobetasol cream (TEMOVATE) 1.61 %, Apply 1 application topically 2 (two) times daily. Lower back, Disp: 60 g, Rfl: 1   erythromycin ophthalmic ointment, Place 1 application. into the right eye daily as needed., Disp: , Rfl:    fluticasone (FLONASE) 50 MCG/ACT nasal spray, Place 2 sprays into both nostrils daily., Disp: 16 g, Rfl: 6   folic acid (FOLVITE) 1 MG tablet, Take 1 tablet by mouth once daily, Disp: 90 tablet, Rfl: 0   ibuprofen (ADVIL,MOTRIN) 200 MG tablet, Take 400 mg by mouth every 8 (eight) hours as needed for headache or moderate pain. , Disp: ,  Rfl:    lidocaine-prilocaine (EMLA) cream, Apply to affected area once, Disp: 30 g, Rfl: 3   loperamide (IMODIUM) 2 MG capsule, Take 1 capsule (2 mg total) by mouth every 2 (two) hours as needed for diarrhea or loose stools. Do not exceed 16mg  daily., Disp: 30 capsule, Rfl: 1   Multiple Vitamins-Minerals (PRESERVISION AREDS 2) CAPS, Take 1 capsule by mouth 2 (two) times a day. , Disp: , Rfl:    ondansetron (ZOFRAN ODT) 4 MG disintegrating tablet, Take 1 tablet (4 mg total) by mouth every 8 (eight) hours as needed for nausea or vomiting., Disp: 20 tablet, Rfl: 0   pantoprazole (PROTONIX) 40 MG tablet, TAKE 1 TABLET BY MOUTH TWICE DAILY BEFORE A MEAL, Disp: 180 tablet, Rfl: 0   polyethylene glycol (MIRALAX / GLYCOLAX) 17 g packet, Take 17 g by mouth daily as needed for mild constipation., Disp: 14 each, Rfl: 0   rosuvastatin (CRESTOR) 20 MG tablet, Take 1 tablet by mouth once daily, Disp: 90 tablet, Rfl: 0   sertraline (ZOLOFT) 100 MG tablet, Take 1 tablet (100 mg total) by mouth daily., Disp: 90 tablet, Rfl: 1   levothyroxine (SYNTHROID) 100 MCG tablet, Take 1 tablet (100 mcg total) by mouth daily before breakfast., Disp: 30 tablet, Rfl: 1   rivastigmine (EXELON) 1.5 MG capsule, Take 1 capsule (1.5 mg total) by mouth 2 (two) times daily., Disp: 60 capsule, Rfl: 3 No current facility-administered medications for this visit.  Facility-Administered Medications Ordered in Other Visits:    denosumab (XGEVA) injection 120 mg, 120 mg, Subcutaneous, Q30 days, Sindy Guadeloupe, MD, 120 mg at 03/05/19 1525  Physical exam:  Vitals:   06/28/21 0859  BP: (!) 154/99  Pulse: 68  Resp: 16  Temp: (!) 96.4 F (35.8 C)  SpO2: 98%  Weight: 178 lb 3.2 oz (80.8 kg)   Physical Exam Constitutional:      General: She is not in acute distress. Cardiovascular:     Rate and Rhythm: Normal rate and regular rhythm.     Heart sounds: Normal heart sounds.  Pulmonary:     Effort: Pulmonary effort is normal.      Breath sounds: Normal breath sounds.  Abdominal:     General: Bowel sounds are normal.     Palpations: Abdomen is soft.  Skin:    General: Skin is warm and dry.  Neurological:     Mental Status: She is alert.     Comments: Oriented to self place and person but not to time        Latest Ref Rng & Units 06/28/2021    8:37 AM  CMP  Glucose 70 - 99 mg/dL 95    BUN 8 - 23 mg/dL 14    Creatinine 0.44 - 1.00 mg/dL 1.47    Sodium 135 - 145 mmol/L 139    Potassium 3.5 - 5.1 mmol/L 3.4    Chloride 98 - 111 mmol/L 97    CO2 22 - 32 mmol/L 32    Calcium 8.9 - 10.3 mg/dL 9.2    Total Protein 6.5 - 8.1 g/dL 7.9    Total Bilirubin 0.3 - 1.2 mg/dL 0.8    Alkaline Phos 38 - 126 U/L 124    AST 15 - 41 U/L 216    ALT 0 - 44 U/L 163        Latest Ref Rng & Units 06/28/2021    8:37 AM  CBC  WBC 4.0 - 10.5 K/uL 5.3    Hemoglobin 12.0 - 15.0 g/dL 11.6    Hematocrit 36.0 - 46.0 % 35.7    Platelets 150 - 400 K/uL 183      No images are attached to the encounter.  MR Brain W Wo Contrast  Result Date: 06/21/2021 CLINICAL DATA:  Slurred speech 3 weeks. History of lung cancer. Rule out stroke or metastatic disease. EXAM: MRI HEAD WITHOUT AND WITH CONTRAST TECHNIQUE: Multiplanar, multiecho pulse sequences of the brain and surrounding structures were obtained without and with intravenous contrast. CONTRAST:  19mL GADAVIST GADOBUTROL 1 MMOL/ML IV SOLN COMPARISON:  MRI head 08/09/2020 FINDINGS: Brain: Mild atrophy. Negative for hydrocephalus. Mild white matter changes with scattered small white matter hyperintensities. Negative for acute infarct. Negative for hemorrhage or mass No enhancing lesions in the brain. Negative for metastatic disease to the brain. Vascular: Normal arterial flow voids at the skull base. Skull and upper cervical spine: Interval improvement in enhancing lesion in the right mandibular condyle with surrounding soft tissue enhancement. Sclerotic changes in the right mandibular condyle  likely due to treated tumor. Surrounding soft tissue enhancement has improved in the interval. No new skeletal lesion identified Sinuses/Orbits: Paranasal sinuses clear. Bilateral cataract extraction Other: None IMPRESSION: Negative for acute infarct. Negative for metastatic disease to brain Interval improvement in enhancing lesion the right mandibular condyle and surrounding soft tissues compatible with treated tumor. Electronically Signed   By: Franchot Gallo M.D.   On: 06/21/2021 19:04     Assessment and plan- Patient is a 71 y.o. female with history of extensive stage small cell lung cancer with bone metastases here for further follow-up  Autoimmune hypothyroidism: Hypothyroidism is currently uncontrolled and her TSH is elevated at 84.  Free T4 is undetectable.  Patient is supposed to be taking 100 mcg of levothyroxine daily but her compliance is highly questionable.  I have reiterated to her husband as well that he needs to give her the thyroid medicine every single day and not rely on the patient taking the medication.  We have also spoken to her daughter who is a Occupational psychologist and explained to all the family members importance of taking her thyroid medication  Altered mental status: Patient was able to tell me her name address date of birth.  She was able to recognize me and verbalized that she gets treatment for her lung cancer.  She was disoriented  to time however.  It is unclear if her altered mental status is secondary to underlying dementia versus uncontrolled hypothyroidism.  MRI brain did not show any acute pathology.  I am hoping the patient's husband can give her the heart thyroid medicine diligently every day and we can see some improvement in her TSH.  She has seen endocrinology in the past but subsequently did not follow-up with them.  I am hesitant to go up on the dose of levothyroxine unless we know that patient is compliant with her existing dose.  Abnormal LFTs:Unclear if it is  secondary to Tecentriq versus hypothyroidism which at times can do this.  Her total bilirubin remains normal but AST ALT are trending up.  I am holding off on giving any further Tecentriq at this time.  We will repeat CMP again in 1 week in 3 weeks and I will see her back in 3 weeks  Metastatic small cell lung cancer: Tecentriq currently on hold due to above issues   Visit Diagnosis 1. Small cell lung cancer, right upper lobe ([place])   2. Autoimmune hypothyroidism   3. Abnormal LFTs   4. Altered mental status, unspecified altered mental status type      Dr. Randa Evens, MD, MPH [facility] [phone] 06/28/2021 12:40 PM

## 2021-06-28 NOTE — Progress Notes (Signed)
[place] about a week ago hit her head on the doorframe going to the bathroom; has a knot on her head; did not get checked out per EMS. Per husand. pt balance is off pt seems very confused.  Husband states that last week she filled her own pill box; might have been the problem also her memory is "gone".

## 2021-07-04 ENCOUNTER — Encounter: Payer: PPO | Attending: Oncology

## 2021-07-07 ENCOUNTER — Ambulatory Visit
Admission: RE | Admit: 2021-07-07 | Discharge: 2021-07-07 | Disposition: A | Discharge status: Home / Self Care | Payer: PPO | Source: Ambulatory Visit | Attending: Family Medicine | Admitting: Family Medicine

## 2021-07-07 ENCOUNTER — Ambulatory Visit (INDEPENDENT_AMBULATORY_CARE_PROVIDER_SITE_OTHER): Payer: PPO

## 2021-07-07 ENCOUNTER — Encounter: Payer: Self-pay | Admitting: Family Medicine

## 2021-07-07 ENCOUNTER — Inpatient Hospital Stay: Payer: PPO | Attending: Oncology

## 2021-07-07 ENCOUNTER — Ambulatory Visit (INDEPENDENT_AMBULATORY_CARE_PROVIDER_SITE_OTHER): Payer: PPO | Admitting: Family Medicine

## 2021-07-07 DIAGNOSIS — C3411 Malignant neoplasm of upper lobe, right bronchus or lung: Secondary | ICD-10-CM | POA: Insufficient documentation

## 2021-07-07 DIAGNOSIS — S0003XA Contusion of scalp, initial encounter: Secondary | ICD-10-CM | POA: Diagnosis not present

## 2021-07-07 DIAGNOSIS — R945 Abnormal results of liver function studies: Secondary | ICD-10-CM | POA: Diagnosis not present

## 2021-07-07 DIAGNOSIS — M25552 Pain in left hip: Secondary | ICD-10-CM | POA: Diagnosis not present

## 2021-07-07 DIAGNOSIS — D171 Benign lipomatous neoplasm of skin and subcutaneous tissue of trunk: Secondary | ICD-10-CM

## 2021-07-07 DIAGNOSIS — K7460 Unspecified cirrhosis of liver: Secondary | ICD-10-CM | POA: Insufficient documentation

## 2021-07-07 DIAGNOSIS — Z8041 Family history of malignant neoplasm of ovary: Secondary | ICD-10-CM | POA: Insufficient documentation

## 2021-07-07 DIAGNOSIS — Z79890 Hormone replacement therapy (postmenopausal): Secondary | ICD-10-CM | POA: Insufficient documentation

## 2021-07-07 DIAGNOSIS — E063 Autoimmune thyroiditis: Secondary | ICD-10-CM | POA: Diagnosis not present

## 2021-07-07 DIAGNOSIS — R413 Other amnesia: Secondary | ICD-10-CM | POA: Insufficient documentation

## 2021-07-07 DIAGNOSIS — M852 Hyperostosis of skull: Secondary | ICD-10-CM | POA: Diagnosis not present

## 2021-07-07 DIAGNOSIS — N179 Acute kidney failure, unspecified: Secondary | ICD-10-CM | POA: Insufficient documentation

## 2021-07-07 DIAGNOSIS — S0990XA Unspecified injury of head, initial encounter: Secondary | ICD-10-CM | POA: Insufficient documentation

## 2021-07-07 DIAGNOSIS — S79912A Unspecified injury of left hip, initial encounter: Secondary | ICD-10-CM | POA: Diagnosis not present

## 2021-07-07 DIAGNOSIS — E032 Hypothyroidism due to medicaments and other exogenous substances: Secondary | ICD-10-CM | POA: Diagnosis not present

## 2021-07-07 DIAGNOSIS — Z79899 Other long term (current) drug therapy: Secondary | ICD-10-CM | POA: Diagnosis not present

## 2021-07-07 DIAGNOSIS — C7951 Secondary malignant neoplasm of bone: Secondary | ICD-10-CM | POA: Insufficient documentation

## 2021-07-07 DIAGNOSIS — R4189 Other symptoms and signs involving cognitive functions and awareness: Secondary | ICD-10-CM | POA: Diagnosis not present

## 2021-07-07 DIAGNOSIS — D179 Benign lipomatous neoplasm, unspecified: Secondary | ICD-10-CM | POA: Insufficient documentation

## 2021-07-07 DIAGNOSIS — F331 Major depressive disorder, recurrent, moderate: Secondary | ICD-10-CM | POA: Diagnosis not present

## 2021-07-07 DIAGNOSIS — H903 Sensorineural hearing loss, bilateral: Secondary | ICD-10-CM | POA: Diagnosis not present

## 2021-07-07 DIAGNOSIS — M47816 Spondylosis without myelopathy or radiculopathy, lumbar region: Secondary | ICD-10-CM | POA: Diagnosis not present

## 2021-07-07 LAB — CBC WITH DIFFERENTIAL/PLATELET
Abs Immature Granulocytes: 0.01 10*3/uL (ref 0.00–0.07)
Basophils Absolute: 0 10*3/uL (ref 0.0–0.1)
Basophils Relative: 1 %
Eosinophils Absolute: 0.1 10*3/uL (ref 0.0–0.5)
Eosinophils Relative: 2 %
HCT: 33.6 % — ABNORMAL LOW (ref 36.0–46.0)
Hemoglobin: 10.8 g/dL — ABNORMAL LOW (ref 12.0–15.0)
Immature Granulocytes: 0 %
Lymphocytes Relative: 21 %
Lymphs Abs: 1.1 10*3/uL (ref 0.7–4.0)
MCH: 30.3 pg (ref 26.0–34.0)
MCHC: 32.1 g/dL (ref 30.0–36.0)
MCV: 94.1 fL (ref 80.0–100.0)
Monocytes Absolute: 0.3 10*3/uL (ref 0.1–1.0)
Monocytes Relative: 7 %
Neutro Abs: 3.6 10*3/uL (ref 1.7–7.7)
Neutrophils Relative %: 69 %
Platelets: 165 10*3/uL (ref 150–400)
RBC: 3.57 MIL/uL — ABNORMAL LOW (ref 3.87–5.11)
RDW: 18.1 % — ABNORMAL HIGH (ref 11.5–15.5)
WBC: 5.2 10*3/uL (ref 4.0–10.5)
nRBC: 0 % (ref 0.0–0.2)

## 2021-07-07 LAB — COMPREHENSIVE METABOLIC PANEL
ALT: 98 U/L — ABNORMAL HIGH (ref 0–44)
AST: 113 U/L — ABNORMAL HIGH (ref 15–41)
Albumin: 4 g/dL (ref 3.5–5.0)
Alkaline Phosphatase: 117 U/L (ref 38–126)
Anion gap: 9 (ref 5–15)
BUN: 14 mg/dL (ref 8–23)
CO2: 31 mmol/L (ref 22–32)
Calcium: 8.8 mg/dL — ABNORMAL LOW (ref 8.9–10.3)
Chloride: 100 mmol/L (ref 98–111)
Creatinine, Ser: 1.25 mg/dL — ABNORMAL HIGH (ref 0.44–1.00)
GFR, Estimated: 46 mL/min — ABNORMAL LOW (ref >60)
Glucose, Bld: 84 mg/dL (ref 70–99)
Potassium: 3.4 mmol/L — ABNORMAL LOW (ref 3.5–5.1)
Sodium: 140 mmol/L (ref 135–145)
Total Bilirubin: 0.5 mg/dL (ref 0.3–1.2)
Total Protein: 7.7 g/dL (ref 6.5–8.1)

## 2021-07-07 NOTE — Assessment & Plan Note (Signed)
This is likely multifactorial with dementia, thyroid dysfunction, depression, and her cancer medication playing a role.  Seems to be stable at this time.  We will monitor.

## 2021-07-07 NOTE — Assessment & Plan Note (Signed)
Related to recent fall.  Check x-ray today.

## 2021-07-07 NOTE — Assessment & Plan Note (Signed)
Patient denies depression today.  I encouraged him to consistently have her take her Wellbutrin and Zoloft.

## 2021-07-07 NOTE — Assessment & Plan Note (Signed)
She will continue Synthroid 100 mcg once daily.  We will plan for TSH in about 4 weeks.  Discussed the importance of taking this medication on a daily basis.

## 2021-07-07 NOTE — Patient Instructions (Signed)
Nice to see you. We will get an x-ray of your hip today and get a CT scan of your head as well. If you develop severe headaches or any neurological deficits please seek medical attention.

## 2021-07-07 NOTE — Assessment & Plan Note (Signed)
Likely a lipoma in her right low back.  We will monitor.

## 2021-07-07 NOTE — Progress Notes (Signed)
Tommi Rumps, MD Phone: [phone]  Anne [name] is a 71 y.o. female who presents today for f/u.  Hypothyroidism: Patient is back to taking her Synthroid 100 mcg daily.  Her husband is making sure that she takes this.  Dementia: Patient reports her memory "sucks."  Has not been taking her rivastigmine.  Depression: Patient denies any depression today.  She notes no SI.  She may or may not be taking her depression medications consistently.  Fall: Patient's with husband reports she had a fall 1 week ago.  She fell onto her left hip and also hit her head.  A number of days after that she got lost coming back from the mailbox and her neighbors helped her get back home and noted she had slurred speech.  Her husband also reports a nodule in her right low back.  Social History   Tobacco Use  Smoking Status Former   Packs/day: 2.00   Years: 50.00   Pack years: 100.00   Types: Cigarettes, E-cigarettes   Quit date: 02/07/2012   Years since quitting: 9.4  Smokeless Tobacco Never    Current Outpatient Medications on File Prior to Visit  Medication Sig Dispense Refill   amLODipine (NORVASC) 5 MG tablet Take 1 tablet (5 mg total) by mouth daily. 90 tablet 3   buPROPion (WELLBUTRIN XL) 300 MG 24 hr tablet Take 1 tablet (300 mg total) by mouth daily. 90 tablet 1   clobetasol cream (TEMOVATE) 2.20 % Apply 1 application topically 2 (two) times daily. Lower back 60 g 1   erythromycin ophthalmic ointment Place 1 application. into the right eye daily as needed.     fluticasone (FLONASE) 50 MCG/ACT nasal spray Place 2 sprays into both nostrils daily. 16 g 6   folic acid (FOLVITE) 1 MG tablet Take 1 tablet by mouth once daily 90 tablet 0   ibuprofen (ADVIL,MOTRIN) 200 MG tablet Take 400 mg by mouth every 8 (eight) hours as needed for headache or moderate pain.      levothyroxine (SYNTHROID) 100 MCG tablet Take 1 tablet (100 mcg total) by mouth daily before breakfast. 30 tablet 1    lidocaine-prilocaine (EMLA) cream Apply to affected area once 30 g 3   loperamide (IMODIUM) 2 MG capsule Take 1 capsule (2 mg total) by mouth every 2 (two) hours as needed for diarrhea or loose stools. Do not exceed 16mg  daily. 30 capsule 1   Multiple Vitamins-Minerals (PRESERVISION AREDS 2) CAPS Take 1 capsule by mouth 2 (two) times a day.      ondansetron (ZOFRAN ODT) 4 MG disintegrating tablet Take 1 tablet (4 mg total) by mouth every 8 (eight) hours as needed for nausea or vomiting. 20 tablet 0   pantoprazole (PROTONIX) 40 MG tablet TAKE 1 TABLET BY MOUTH TWICE DAILY BEFORE A MEAL 180 tablet 0   polyethylene glycol (MIRALAX / GLYCOLAX) 17 g packet Take 17 g by mouth daily as needed for mild constipation. 14 each 0   rosuvastatin (CRESTOR) 20 MG tablet Take 1 tablet by mouth once daily 90 tablet 0   sertraline (ZOLOFT) 100 MG tablet Take 1 tablet (100 mg total) by mouth daily. 90 tablet 1   rivastigmine (EXELON) 1.5 MG capsule Take 1 capsule (1.5 mg total) by mouth 2 (two) times daily. 60 capsule 3   Current Facility-Administered Medications on File Prior to Visit  Medication Dose Route Frequency Provider Last Rate Last Admin   denosumab (XGEVA) injection 120 mg  120 mg Subcutaneous Q30 days  Sindy Guadeloupe, MD   120 mg at 03/05/19 1525     ROS see history of present illness  Objective  Physical Exam Vitals:   07/07/21 0956  BP: 140/80  Pulse: 72  Temp: 98.5 F (36.9 C)  SpO2: 92%    BP Readings from Last 3 Encounters:  07/07/21 140/80  06/28/21 (!) 154/99  06/16/21 136/88   Wt Readings from Last 3 Encounters:  07/07/21 178 lb 9.6 oz (81 kg)  06/28/21 178 lb 3.2 oz (80.8 kg)  06/16/21 182 lb (82.6 kg)    Physical Exam Constitutional:      General: She is not in acute distress.    Appearance: She is not diaphoretic.  HENT:     Head: No raccoon eyes or Battle's sign.      Right Ear: No hemotympanum.     Left Ear: No hemotympanum.  Cardiovascular:     Rate and  Rhythm: Normal rate and regular rhythm.     Heart sounds: Normal heart sounds.  Pulmonary:     Effort: Pulmonary effort is normal.     Breath sounds: Normal breath sounds.  Musculoskeletal:       Back:     Comments: Improving bruising over her left hip with tenderness over her left greater trochanter  Skin:    General: Skin is warm and dry.  Neurological:     Mental Status: She is alert.     Comments: Hearing diminished bilaterally otherwise CN 3-12 intact, 5/5 strength in bilateral biceps, triceps, grip, quads, hamstrings, plantar and dorsiflexion, sensation to light touch intact in bilateral UE and LE     Assessment/Plan: Please see individual problem list.  Problem List Items Addressed This Visit     Cognitive impairment (Chronic)    This is likely multifactorial with dementia, thyroid dysfunction, depression, and her cancer medication playing a role.  Seems to be stable at this time.  We will monitor.       Depression (Chronic)    Patient denies depression today.  I encouraged him to consistently have her take her Wellbutrin and Zoloft.       Hypothyroidism (Chronic)    She will continue Synthroid 100 mcg once daily.  We will plan for TSH in about 4 weeks.  Discussed the importance of taking this medication on a daily basis.       Head injury    Patient with a head injury recently related to a fall.  Possibly has had slurred speech after the head injury.  Given her age and possible slurred speech with the head injury we will check a CT head today to evaluate for intracranial bleeding.  Order placed.       Relevant Orders   CT HEAD WO CONTRAST (5MM)   Left hip pain    Related to recent fall.  Check x-ray today.       Relevant Orders   DG HIP UNILAT W OR W/O PELVIS 2-3 VIEWS LEFT   Lipoma    Likely a lipoma in her right low back.  We will monitor.         Return in about 4 weeks (around 08/04/2021) for Follow-up with PCP for recheck of thyroid.   Tommi Rumps, MD [place]

## 2021-07-07 NOTE — Assessment & Plan Note (Signed)
Patient with a head injury recently related to a fall.  Possibly has had slurred speech after the head injury.  Given her age and possible slurred speech with the head injury we will check a CT head today to evaluate for intracranial bleeding.  Order placed.

## 2021-07-08 ENCOUNTER — Other Ambulatory Visit: Payer: Self-pay

## 2021-07-08 ENCOUNTER — Encounter: Payer: Self-pay | Admitting: Emergency Medicine

## 2021-07-08 DIAGNOSIS — R413 Other amnesia: Secondary | ICD-10-CM | POA: Diagnosis not present

## 2021-07-08 DIAGNOSIS — W2209XA Striking against other stationary object, initial encounter: Secondary | ICD-10-CM | POA: Diagnosis not present

## 2021-07-08 DIAGNOSIS — Z5321 Procedure and treatment not carried out due to patient leaving prior to being seen by health care provider: Secondary | ICD-10-CM | POA: Diagnosis not present

## 2021-07-08 LAB — COMPREHENSIVE METABOLIC PANEL
ALT: 111 U/L — ABNORMAL HIGH (ref 0–44)
AST: 121 U/L — ABNORMAL HIGH (ref 15–41)
Albumin: 4.3 g/dL (ref 3.5–5.0)
Alkaline Phosphatase: 129 U/L — ABNORMAL HIGH (ref 38–126)
Anion gap: 7 (ref 5–15)
BUN: 10 mg/dL (ref 8–23)
CO2: 32 mmol/L (ref 22–32)
Calcium: 9.3 mg/dL (ref 8.9–10.3)
Chloride: 103 mmol/L (ref 98–111)
Creatinine, Ser: 1.07 mg/dL — ABNORMAL HIGH (ref 0.44–1.00)
GFR, Estimated: 55 mL/min — ABNORMAL LOW (ref >60)
Glucose, Bld: 100 mg/dL — ABNORMAL HIGH (ref 70–99)
Potassium: 3.2 mmol/L — ABNORMAL LOW (ref 3.5–5.1)
Sodium: 142 mmol/L (ref 135–145)
Total Bilirubin: 0.6 mg/dL (ref 0.3–1.2)
Total Protein: 8.3 g/dL — ABNORMAL HIGH (ref 6.5–8.1)

## 2021-07-08 LAB — URINALYSIS, ROUTINE W REFLEX MICROSCOPIC
Bilirubin Urine: NEGATIVE
Glucose, UA: NEGATIVE mg/dL
Hgb urine dipstick: NEGATIVE
Ketones, ur: NEGATIVE mg/dL
Leukocytes,Ua: NEGATIVE
Nitrite: NEGATIVE
Protein, ur: NEGATIVE mg/dL
Specific Gravity, Urine: 1.002 — ABNORMAL LOW (ref 1.005–1.030)
pH: 8 (ref 5.0–8.0)

## 2021-07-08 LAB — CBC
HCT: 36.1 % (ref 36.0–46.0)
Hemoglobin: 11.1 g/dL — ABNORMAL LOW (ref 12.0–15.0)
MCH: 29.1 pg (ref 26.0–34.0)
MCHC: 30.7 g/dL (ref 30.0–36.0)
MCV: 94.8 fL (ref 80.0–100.0)
Platelets: 173 10*3/uL (ref 150–400)
RBC: 3.81 MIL/uL — ABNORMAL LOW (ref 3.87–5.11)
RDW: 18.1 % — ABNORMAL HIGH (ref 11.5–15.5)
WBC: 5.6 10*3/uL (ref 4.0–10.5)
nRBC: 0 % (ref 0.0–0.2)

## 2021-07-08 NOTE — ED Triage Notes (Signed)
Pt presents to ER with husband who is concern about pt's memory, reports she had a fall about a week ago and hit her head. Per husband pt is more forgetful today.

## 2021-07-09 ENCOUNTER — Emergency Department
Admission: EM | Admit: 2021-07-09 | Discharge: 2021-07-09 | Discharge status: Against Medical Advice | Payer: PPO | Attending: Emergency Medicine | Admitting: Emergency Medicine

## 2021-07-09 HISTORY — DX: Unspecified dementia, unspecified severity, without behavioral disturbance, psychotic disturbance, mood disturbance, and anxiety: F03.90

## 2021-07-10 ENCOUNTER — Telehealth: Payer: Self-pay

## 2021-07-10 NOTE — Telephone Encounter (Signed)
Noted. Please follow-up with them to see how she is doing. If her mental status remains acutely changed she should go to the ED.

## 2021-07-10 NOTE — Telephone Encounter (Signed)
Access nurse called back to let office know that patient refused to go to the Emergency Room.

## 2021-07-10 NOTE — Telephone Encounter (Signed)
Patient's husband, Anne [name], called to state patient had a fall about week ago and saw Dr. Caryl Bis on 07/07/2021.  Anne [name] states patient had an x-ray and CT scan on 07/07/2021, but he hasn't heard the results.  Anne [name] states patient still has a knot on her head.  Anne [name] states patient has dementia but it got really bad this weekend.  Anne [name] states patient asked him to take her to her apartment, but she doesn't have an apartment.  Anne [name] states patient didn't know where she was or who anybody was.  Anne [name] states this level of dementia comes and goes for the patient.  Anne [name] states he took patient to the ED over the weekend, but the wait became too long and they left.  **Transferred call to Access Nurse.

## 2021-07-10 NOTE — Telephone Encounter (Signed)
I called and received no answer and no VM.  Janyra Barillas,cma

## 2021-07-17 ENCOUNTER — Other Ambulatory Visit: Payer: Self-pay | Admitting: Neurology

## 2021-07-17 ENCOUNTER — Telehealth: Payer: Self-pay

## 2021-07-17 ENCOUNTER — Ambulatory Visit: Payer: PPO | Admitting: Gastroenterology

## 2021-07-17 DIAGNOSIS — Z9289 Personal history of other medical treatment: Secondary | ICD-10-CM | POA: Diagnosis not present

## 2021-07-17 DIAGNOSIS — R7989 Other specified abnormal findings of blood chemistry: Secondary | ICD-10-CM | POA: Diagnosis not present

## 2021-07-17 NOTE — Telephone Encounter (Signed)
Rx refilled.

## 2021-07-17 NOTE — Telephone Encounter (Signed)
Called patient to her cell phone and home phone and she did not pick up and I was not able to leave a voicemail. However, Dr. Vicente Males cancelled her appointment for today since she did not have her labs drawn and she did not do the images that Dr. Vicente Males ordered on her last office visit. Patient is to have her labs drawn and images done before she could reschedule her appointment with Dr. Vicente Males. Patient could go to any [facility] location or come here to our office to have them drawn and she is to call [phone] [facility] Scheduling) to schedule her images.

## 2021-07-18 ENCOUNTER — Encounter: Payer: Self-pay | Admitting: Oncology

## 2021-07-18 NOTE — Progress Notes (Signed)
[place]  1. Celiac ab positive- needs EGD to confirm celiac  2. Creatinine elkevated to 1.6 needs to stop any water tablets, nsaids and ideally needs fluids- need to go to urgent care or Er to get some fluids  3. CK elevated recheck in 3 weeks   FYI Dr Caryl Bis   Dr Jonathon Bellows MD,MRCP [facility]) Gastroenterology/Hepatology Pager: [phone]

## 2021-07-19 ENCOUNTER — Inpatient Hospital Stay (HOSPITAL_BASED_OUTPATIENT_CLINIC_OR_DEPARTMENT_OTHER): Payer: PPO | Admitting: Hospice and Palliative Medicine

## 2021-07-19 ENCOUNTER — Other Ambulatory Visit: Payer: Self-pay

## 2021-07-19 ENCOUNTER — Ambulatory Visit: Payer: PPO | Admitting: Family Medicine

## 2021-07-19 ENCOUNTER — Encounter: Payer: Self-pay | Admitting: Licensed Clinical Social Worker

## 2021-07-19 ENCOUNTER — Encounter: Payer: Self-pay | Admitting: Oncology

## 2021-07-19 ENCOUNTER — Inpatient Hospital Stay (HOSPITAL_BASED_OUTPATIENT_CLINIC_OR_DEPARTMENT_OTHER): Payer: PPO | Admitting: Oncology

## 2021-07-19 ENCOUNTER — Inpatient Hospital Stay: Payer: PPO

## 2021-07-19 VITALS — BP 137/93 | HR 76 | Temp 96.8°F | Resp 16 | Ht 65.0 in | Wt 172.7 lb

## 2021-07-19 DIAGNOSIS — C3411 Malignant neoplasm of upper lobe, right bronchus or lung: Secondary | ICD-10-CM | POA: Diagnosis not present

## 2021-07-19 DIAGNOSIS — R413 Other amnesia: Secondary | ICD-10-CM | POA: Diagnosis not present

## 2021-07-19 DIAGNOSIS — R7989 Other specified abnormal findings of blood chemistry: Secondary | ICD-10-CM

## 2021-07-19 DIAGNOSIS — E063 Autoimmune thyroiditis: Secondary | ICD-10-CM | POA: Diagnosis not present

## 2021-07-19 DIAGNOSIS — Z515 Encounter for palliative care: Secondary | ICD-10-CM | POA: Diagnosis not present

## 2021-07-19 LAB — COMPREHENSIVE METABOLIC PANEL
ALT: 43 U/L (ref 0–44)
AST: 45 U/L — ABNORMAL HIGH (ref 15–41)
Albumin: 3.9 g/dL (ref 3.5–5.0)
Alkaline Phosphatase: 99 U/L (ref 38–126)
Anion gap: 9 (ref 5–15)
BUN: 12 mg/dL (ref 8–23)
CO2: 32 mmol/L (ref 22–32)
Calcium: 9.1 mg/dL (ref 8.9–10.3)
Chloride: 100 mmol/L (ref 98–111)
Creatinine, Ser: 1.14 mg/dL — ABNORMAL HIGH (ref 0.44–1.00)
GFR, Estimated: 51 mL/min — ABNORMAL LOW (ref >60)
Glucose, Bld: 88 mg/dL (ref 70–99)
Potassium: 3.1 mmol/L — ABNORMAL LOW (ref 3.5–5.1)
Sodium: 141 mmol/L (ref 135–145)
Total Bilirubin: 0.4 mg/dL (ref 0.3–1.2)
Total Protein: 7.6 g/dL (ref 6.5–8.1)

## 2021-07-19 MED ORDER — POTASSIUM CHLORIDE CRYS ER 20 MEQ PO TBCR
20.0000 meq | EXTENDED_RELEASE_TABLET | Freq: Every day | ORAL | 0 refills | Status: DC
Start: 2021-07-19 — End: 2021-07-31

## 2021-07-19 NOTE — Progress Notes (Signed)
Lab (CK) has been entered for next lab visit.

## 2021-07-19 NOTE — Progress Notes (Signed)
[place] Work  Initial Assessment   Anne [name] is a 71 y.o. year old female contacted caregiver by phone. Clinical Social Work was referred by medical provider for assessment of psychosocial needs.   SDOH (Social Determinants of Health) assessments performed: No   SDOH Screenings   Alcohol Screen: Not on file  Depression (PHQ2-9): Low Risk  (07/07/2021)   Depression (PHQ2-9)    PHQ-2 Score: 0  Financial Resource Strain: Low Risk  (09/08/2020)   Overall Financial Resource Strain (CARDIA)    Difficulty of Paying Living Expenses: Not hard at all  Food Insecurity: No Food Insecurity (09/08/2020)   Hunger Vital Sign    Worried About Running Out of Food in the Last Year: Never true    [place] in the Last Year: Never true  Housing: Low Risk  (09/08/2020)   Housing    Last Housing Risk Score: 0  Physical Activity: Unknown (10/29/2018)   Exercise Vital Sign    Days of Exercise per Week: Patient refused    Minutes of Exercise per Session: Patient refused  Social Connections: Unknown (09/08/2020)   Social Connection and Isolation Panel [NHANES]    Frequency of Communication with Friends and Family: More than three times a week    Frequency of Social Gatherings with Friends and Family: Not on file    Attends Religious Services: Not on file    Active Member of [place] or Organizations: Not on file    Attends Archivist Meetings: Not on file    Marital Status: Married  Stress: No Stress Concern Present (09/08/2020)   [employer] [place] of Stress : Not at all  Tobacco Use: Medium Risk (07/08/2021)   Patient History    Smoking Tobacco Use: Former    Smokeless Tobacco Use: Never    Passive Exposure: Not on file  Transportation Needs: No Transportation Needs (09/08/2020)   PRAPARE - Transportation    Lack of Transportation (Medical): No    Lack of Transportation (Non-Medical): No     Distress Screen  completed: No     No data to display            Family/Social Information:  Housing Arrangement: patient lives with spouse  Anne [name] [phone] or [phone], CSW spoke with patient's daughter Anne [name] [phone] Family members/support persons in your life? Family, Friends, and Geophysical data processor concerns: no  Employment: Retired .Marland [employer]  Income source: Paediatric nurse concerns: No Type of concern: None Food access concerns: no Religious or spiritual practice: Not known Services Currently in place:  [employer]  Coping/ Adjustment to diagnosis: Patient understands treatment plan and what happens next? no, patient has dx of dementia, cognitive function intermittent, patient's spouse or family support accompany patient to appointments Concerns about diagnosis and/or treatment:  Patient's family is concern about patient because she is at home alone during the day, spouse works. Patient reported stressors: Childcare/ elder care and . Hopes and/or priorities: N/A Patient enjoys  N/A Current coping skills/ strengths: Supportive family/friends     SUMMARY: Current SDOH Barriers:  Financial constraints related to fixed income and Limited access to caregiver  Clinical Social Work Clinical Goal(s):  No clinical social work goals at this time  Interventions: Discussed common feeling and emotions when being diagnosed with cancer, and the importance of support during treatment Informed patient of the support team roles and support services at  [facility] Provided CSW contact information and encouraged patient to call with any questions or concerns Provided patient with information about CSW role in patient car and available resources.  CSW sent private duty caregiver list to patient's daughter Ms. Neddham email [email or web address]   Follow Up Plan:  Care giver will contact CSW with additional questions or concerns Patient  verbalizes understanding of plan: Yes    Gerard Cantara, LCSW

## 2021-07-19 NOTE — Progress Notes (Addendum)
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  She has been referred to [place] for findings of bone metastases and her recent MRI.  She has a prior history of 3 packs/day day smoking for over 45 years and quit smoking 5 years ago.She had a CT chest abdomen pelvis in 2018 which showed a 5 mm lung nodule in the left lower lobe.  Recently over the last 2 months patient has been having worsening back pain and was referred to orthopedics.  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  Pathologic fracture of S1.   PET scan showed 2 RUL lung nodules, hilar and mediastinal adenopathy and widespread bone mets. MRI brain negative.   Patient completed palliative RT to her spine. Bronchoscopy showed small cell lung cancer.  Palliative carboplatin, etoposide and Tecentriq started on 08/18/2018. Scans after 4 cycles showed stable disease. 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  She reports being compliant but her values  fluctuate widely.    Interval history-patient is here with her husband today.  She had a fall about a week ago and hit the side of her face causing a black eye.  She did not lose consciousness.  Memory has been waxing and waning and then was the time when she got lost on the road and it took 3 hours for her husband to find her.  Patient does not remember the month or the year.  She knows she is at the cancer center but does not remember my name.  Patient's husband says he has been giving her levothyroxine consistently and she has not missed any doses  ECOG PS- 2 Pain scale- 0   Review of systems- Review of Systems  Constitutional:  Negative for chills, fever, malaise/fatigue and weight loss.  HENT:  Negative for congestion, ear discharge and nosebleeds.   Eyes:  Negative for blurred vision.  Respiratory:  Negative for cough, hemoptysis, sputum production, shortness of breath and wheezing.   Cardiovascular:  Negative for chest pain, palpitations, orthopnea and claudication.  Gastrointestinal:  Negative for abdominal pain, blood in stool, constipation, diarrhea, heartburn, melena, nausea and vomiting.  Genitourinary:  Negative for dysuria, flank pain, frequency, hematuria and urgency.  Musculoskeletal:  Positive for falls. Negative for back pain, joint pain and myalgias.  Skin:  Negative for rash.  Neurological:  Negative for dizziness, tingling, focal weakness, seizures, weakness and headaches.  Endo/Heme/Allergies:  Does not bruise/bleed easily.  Psychiatric/Behavioral:  Positive for memory loss. Negative for depression and suicidal ideas. The patient does not have insomnia.       Allergies  Allergen Reactions   Bee  Venom Swelling     Past Medical History:  Diagnosis Date   Arthritis    Cirrhosis ([place])    Dementia ([place])    Depression    GERD (gastroesophageal reflux disease)    Hyperlipidemia    Hypertension    Lung cancer ([place])    Metastatic bone cancer      Past Surgical  History:  Procedure Laterality Date   APPENDECTOMY  1971   CHOLECYSTECTOMY  1971   COLONOSCOPY WITH PROPOFOL N/A 11/15/2016   Procedure: COLONOSCOPY WITH PROPOFOL;  Surgeon: Jonathon Bellows, MD;  Location[facility] ENDOSCOPY;  Service: Gastroenterology;  Laterality: N/A;   ENDOBRONCHIAL ULTRASOUND Right 07/30/2018   Procedure: ENDOBRONCHIAL ULTRASOUND;  Surgeon: Laverle Hobby, MD;  Location: [facility] ORS;  Service: Pulmonary;  Laterality: Right;   ESOPHAGOGASTRODUODENOSCOPY (EGD) WITH PROPOFOL N/A 01/07/2017   Procedure: ESOPHAGOGASTRODUODENOSCOPY (EGD) WITH PROPOFOL;  Surgeon: Jonathon Bellows, MD;  Location: [facility] ENDOSCOPY;  Service: Gastroenterology;  Laterality: N/A;   LAPAROSCOPY N/A 03/01/2017   Procedure: LAPAROSCOPY DIAGNOSTIC;  Surgeon: Robert Bellow, MD;  Location: [facility] ORS;  Service: General;  Laterality: N/A;   PORTA CATH INSERTION N/A 08/14/2018   Procedure: PORTA CATH INSERTION;  Surgeon: Algernon Huxley, MD;  Location: [facility] CV LAB;  Service: Cardiovascular;  Laterality: N/A;   TONSILECTOMY, ADENOIDECTOMY, BILATERAL MYRINGOTOMY AND TUBES  1955   TONSILLECTOMY     VENTRAL HERNIA REPAIR N/A 03/01/2017   10 x 14 CM Ventralight ST mesh, intraperitoneal location.    VENTRAL HERNIA REPAIR N/A 03/01/2017   Procedure: HERNIA REPAIR VENTRAL ADULT;  Surgeon: Robert Bellow, MD;  Location: [facility] ORS;  Service: General;  Laterality: N/A;    Social History   Socioeconomic History   Marital status: Married    Spouse name: Johnny   Number of children: Not on file   Years of education: Not on file   Highest education level: Not on file  Occupational History   Not on file  Tobacco Use   Smoking status: Former    Packs/day: 2.00    Years: 50.00    Total pack years: 100.00    Types: Cigarettes, E-cigarettes    Quit date: 02/07/2012    Years since quitting: 9.4   Smokeless tobacco: Never  Vaping Use   Vaping Use: Some days   Start date: 10/06/2013   Devices: uses no liquid  Substance  and Sexual Activity   Alcohol use: No    Alcohol/week: 0.0 standard drinks of alcohol   Drug use: No   Sexual activity: Yes  Other Topics Concern   Not on file  Social History Narrative   Married   Retired   Clinical cytogeneticist level of education    No children    1 cup of coffee   Social Determinants of Health   Financial Resource Strain: Low Risk  (09/08/2020)   Overall Financial Resource Strain (CARDIA)    Difficulty of Paying Living Expenses: Not hard at all  Food Insecurity: No Food Insecurity (09/08/2020)   Hunger Vital Sign    Worried About Running Out of Food in the Last Year: Never true    [place] in the Last Year: Never true  Transportation Needs: No Transportation Needs (09/08/2020)   PRAPARE - Hydrologist (Medical): No    Lack of Transportation (Non-Medical): No  Physical Activity: Unknown (10/29/2018)   Exercise Vital Sign    Days of Exercise per Week: Patient refused    Minutes of  Exercise per Session: Patient refused  Stress: No Stress Concern Present (09/08/2020)   [place]    Feeling of Stress : Not at all  Social Connections: Unknown (09/08/2020)   Social Connection and Isolation Panel [NHANES]    Frequency of Communication with Friends and Family: More than three times a week    Frequency of Social Gatherings with Friends and Family: Not on file    Attends Religious Services: Not on file    Active Member of [place] or Organizations: Not on file    Attends Archivist Meetings: Not on file    Marital Status: Married  Intimate Partner Violence: Not At Risk (09/08/2020)   Humiliation, Afraid, Rape, and Kick questionnaire    Fear of Current or Ex-Partner: No    Emotionally Abused: No    Physically Abused: No    Sexually Abused: No    Family History  Problem Relation Age of Onset   Hypertension Mother    Ovarian cancer Mother 56   Heart disease Father     Stroke Father    Ovarian cancer Sister        ? dx cancer had hyst.   Breast cancer Neg Hx      Current Outpatient Medications:    amLODipine (NORVASC) 5 MG tablet, Take 1 tablet (5 mg total) by mouth daily., Disp: 90 tablet, Rfl: 3   folic acid (FOLVITE) 1 MG tablet, Take 1 tablet by mouth once daily, Disp: 90 tablet, Rfl: 0   levothyroxine (SYNTHROID) 100 MCG tablet, Take 1 tablet (100 mcg total) by mouth daily before breakfast., Disp: 30 tablet, Rfl: 1   Multiple Vitamins-Minerals (PRESERVISION AREDS 2) CAPS, Take 1 capsule by mouth 2 (two) times a day. , Disp: , Rfl:    rivastigmine (EXELON) 1.5 MG capsule, Take 1 capsule by mouth twice daily, Disp: 60 capsule, Rfl: 0   rosuvastatin (CRESTOR) 20 MG tablet, Take 1 tablet by mouth once daily, Disp: 90 tablet, Rfl: 0   sertraline (ZOLOFT) 100 MG tablet, Take 1 tablet (100 mg total) by mouth daily., Disp: 90 tablet, Rfl: 1   buPROPion (WELLBUTRIN XL) 300 MG 24 hr tablet, Take 1 tablet (300 mg total) by mouth daily., Disp: 90 tablet, Rfl: 1   clobetasol cream (TEMOVATE) 1.69 %, Apply 1 application topically 2 (two) times daily. Lower back (Patient not taking: Reported on 07/19/2021), Disp: 60 g, Rfl: 1   erythromycin ophthalmic ointment, Place 1 application. into the right eye daily as needed. (Patient not taking: Reported on 07/19/2021), Disp: , Rfl:    fluticasone (FLONASE) 50 MCG/ACT nasal spray, Place 2 sprays into both nostrils daily. (Patient not taking: Reported on 07/19/2021), Disp: 16 g, Rfl: 6   ibuprofen (ADVIL,MOTRIN) 200 MG tablet, Take 400 mg by mouth every 8 (eight) hours as needed for headache or moderate pain.  (Patient not taking: Reported on 07/19/2021), Disp: , Rfl:    lidocaine-prilocaine (EMLA) cream, Apply to affected area once (Patient not taking: Reported on 07/19/2021), Disp: 30 g, Rfl: 3   loperamide (IMODIUM) 2 MG capsule, Take 1 capsule (2 mg total) by mouth every 2 (two) hours as needed for diarrhea or loose stools. Do  not exceed 16mg  daily. (Patient not taking: Reported on 07/19/2021), Disp: 30 capsule, Rfl: 1   ondansetron (ZOFRAN ODT) 4 MG disintegrating tablet, Take 1 tablet (4 mg total) by mouth every 8 (eight) hours as needed for nausea or vomiting. (Patient not taking:  Reported on 07/19/2021), Disp: 20 tablet, Rfl: 0   pantoprazole (PROTONIX) 40 MG tablet, TAKE 1 TABLET BY MOUTH TWICE DAILY BEFORE A MEAL, Disp: 180 tablet, Rfl: 0   polyethylene glycol (MIRALAX / GLYCOLAX) 17 g packet, Take 17 g by mouth daily as needed for mild constipation. (Patient not taking: Reported on 07/19/2021), Disp: 14 each, Rfl: 0 No current facility-administered medications for this visit.  Facility-Administered Medications Ordered in Other Visits:    denosumab (XGEVA) injection 120 mg, 120 mg, Subcutaneous, Q30 days, Sindy Guadeloupe, MD, 120 mg at 03/05/19 1525  Physical exam:  Vitals:   07/19/21 0925  BP: (!) 137/93  Pulse: 76  Resp: 16  Temp: (!) 96.8 F (36 C)  TempSrc: Tympanic  Weight: 172 lb 11.2 oz (78.3 kg)  Height: 5\' 5"  (1.651 m)   Physical Exam Constitutional:      General: She is not in acute distress. Cardiovascular:     Rate and Rhythm: Normal rate and regular rhythm.     Heart sounds: Normal heart sounds.  Pulmonary:     Effort: Pulmonary effort is normal.     Breath sounds: Normal breath sounds.  Abdominal:     General: Bowel sounds are normal.     Palpations: Abdomen is soft.  Skin:    General: Skin is warm and dry.     Comments: Right Perirorbital soft tissue hematoma  Neurological:     General: No focal deficit present.     Mental Status: She is alert.     Comments: Oriented to self and place but not time         Latest Ref Rng & Units 07/19/2021    9:02 AM  CMP  Glucose 70 - 99 mg/dL 88   BUN 8 - 23 mg/dL 12   Creatinine 0.44 - 1.00 mg/dL 1.14   Sodium 135 - 145 mmol/L 141   Potassium 3.5 - 5.1 mmol/L 3.1   Chloride 98 - 111 mmol/L 100   CO2 22 - 32 mmol/L 32   Calcium 8.9 -  10.3 mg/dL 9.1   Total Protein 6.5 - 8.1 g/dL 7.6   Total Bilirubin 0.3 - 1.2 mg/dL 0.4   Alkaline Phos 38 - 126 U/L 99   AST 15 - 41 U/L 45   ALT 0 - 44 U/L 43       Latest Ref Rng & Units 07/08/2021   10:13 PM  CBC  WBC 4.0 - 10.5 K/uL 5.6   Hemoglobin 12.0 - 15.0 g/dL 11.1   Hematocrit 36.0 - 46.0 % 36.1   Platelets 150 - 400 K/uL 173     No images are attached to the encounter.  DG HIP UNILAT W OR W/O PELVIS 2-3 VIEWS LEFT  Result Date: 07/07/2021 CLINICAL DATA:  Trauma, fall EXAM: DG HIP (WITH OR WITHOUT PELVIS) 2-3V LEFT COMPARISON:  None Available. FINDINGS: No fracture or dislocation is seen. Arterial calcifications are seen in the soft tissues. Degenerative changes are noted in the lower lumbar spine. IMPRESSION: No fracture or dislocation is seen in the pelvis and left hip. Electronically Signed   By: Elmer Picker M.D.   On: 07/07/2021 12:16   CT HEAD WO CONTRAST (5MM)  Result Date: 07/07/2021 CLINICAL DATA:  Trauma, fall EXAM: CT HEAD WITHOUT CONTRAST TECHNIQUE: Contiguous axial images were obtained from the base of the skull through the vertex without intravenous contrast. RADIATION DOSE REDUCTION: This exam was performed according to the departmental dose-optimization program which includes automated  exposure control, adjustment of the mA and/or kV according to patient size and/or use of iterative reconstruction technique. COMPARISON:  MR brain done on 06/21/2021, CT brain done on 10/15/2018 FINDINGS: Brain: No acute intracranial findings are seen. There are no signs of bleeding within the cranium. Ventricles are not dilated. Cortical sulci are prominent. Vascular: Scattered arterial calcifications are seen. Skull: No fracture is seen in the calvarium. Hyperostosis frontalis interna is seen. There is subcutaneous hematoma in the left posterior parietal scalp. Sinuses/Orbits: Small osteoma is seen in the left ethmoid sinus. Left mastoid is hypoplastic. Other: None. IMPRESSION:  No acute intracranial findings are seen in noncontrast CT brain. Atrophy. Hematoma is seen in the left parietal scalp. No fracture is seen in the calvarium. Electronically Signed   By: Elmer Picker M.D.   On: 07/07/2021 12:15   MR Brain W Wo Contrast  Result Date: 06/21/2021 CLINICAL DATA:  Slurred speech 3 weeks. History of lung cancer. Rule out stroke or metastatic disease. EXAM: MRI HEAD WITHOUT AND WITH CONTRAST TECHNIQUE: Multiplanar, multiecho pulse sequences of the brain and surrounding structures were obtained without and with intravenous contrast. CONTRAST:  49mL GADAVIST GADOBUTROL 1 MMOL/ML IV SOLN COMPARISON:  MRI head 08/09/2020 FINDINGS: Brain: Mild atrophy. Negative for hydrocephalus. Mild white matter changes with scattered small white matter hyperintensities. Negative for acute infarct. Negative for hemorrhage or mass No enhancing lesions in the brain. Negative for metastatic disease to the brain. Vascular: Normal arterial flow voids at the skull base. Skull and upper cervical spine: Interval improvement in enhancing lesion in the right mandibular condyle with surrounding soft tissue enhancement. Sclerotic changes in the right mandibular condyle likely due to treated tumor. Surrounding soft tissue enhancement has improved in the interval. No new skeletal lesion identified Sinuses/Orbits: Paranasal sinuses clear. Bilateral cataract extraction Other: None IMPRESSION: Negative for acute infarct. Negative for metastatic disease to brain Interval improvement in enhancing lesion the right mandibular condyle and surrounding soft tissues compatible with treated tumor. Electronically Signed   By: Franchot Gallo M.D.   On: 06/21/2021 19:04     Assessment and plan- Patient is a 71 y.o. 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  See below.  Autoimmune hypothyroidism: Patient has been noncompliant with her medications and her last TSH on 06/28/2021 was elevated at 84.  Patient's husband has not been giving her the thyroid medicine which I hope patient is taking it consistently.  I will plan to repeat her TSH again in 4 weeks.  Memory loss: Unclear if it is secondary to underlying hypothyroidism.  MRI brain in May 2023 did not show any evidence of metastatic disease.  I am also referring her to neurology at this time.  I will also have her see palliative care today and I will make a referral to social work  Abnormal LFTs: Etiology unclear and may be secondary to Tecentriq.  Was also seen by GI and underwent autoimmune work-up.  Presently her LFTs are improving and given that she has multiple appointments I will inform GI to hold off on her follow-up at this time and I will continue to monitor her LFTs   Visit Diagnosis 1. Small cell lung cancer, right upper lobe ([place])   2. Memory loss or impairment   3. Autoimmune hypothyroidism   4. Abnormal LFTs  Dr. Randa Evens, MD, MPH [facility] [phone] 07/19/2021 10:31 AM

## 2021-07-19 NOTE — Progress Notes (Signed)
Creatinine better today. I did not give fluids

## 2021-07-19 NOTE — Progress Notes (Signed)
[place] at [facility] [phone] [phone]) [phone]   Name: Anne [name] Date: 07/19/2021 MRN: [id number]  DOB: [birth year]  Patient Care Team: Leone Haven, MD as PCP - General (Family Medicine) Caryl Bis Angela Adam, MD as Consulting Physician (Family Medicine) Bary Castilla, Forest Gleason, MD (General Surgery) Telford Nab, RN as Registered Nurse Sindy Guadeloupe, MD as Consulting Physician (Hematology and Oncology)    REASON FOR CONSULTATION: Anne [name] is a 71 y.o. 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  She is also had memory loss of unclear etiology.  Patient was referred to palliative care to discuss resources and provide ongoing support.  SOCIAL HISTORY:     reports that she quit smoking about 9 years ago. Her smoking use included cigarettes and e-cigarettes. She has a 100.00 pack-year smoking history. She has never used smokeless tobacco. She reports that she does not drink alcohol and does not use drugs.  Patient is married lives at home with her husband.  ADVANCE DIRECTIVES:  Not on file  CODE STATUS:   PAST MEDICAL HISTORY: Past Medical History:  Diagnosis Date   Arthritis    Cirrhosis ([place])    Dementia ([place])    Depression    GERD (gastroesophageal reflux disease)    Hyperlipidemia    Hypertension    Lung cancer ([place])    Metastatic bone cancer     PAST SURGICAL HISTORY:  Past Surgical History:  Procedure Laterality Date   APPENDECTOMY  1971   CHOLECYSTECTOMY  1971   COLONOSCOPY WITH PROPOFOL N/A 11/15/2016   Procedure: COLONOSCOPY WITH PROPOFOL;  Surgeon: Jonathon Bellows, MD;  Location: [facility] ENDOSCOPY;  Service: Gastroenterology;  Laterality: N/A;   ENDOBRONCHIAL ULTRASOUND Right 07/30/2018   Procedure:  ENDOBRONCHIAL ULTRASOUND;  Surgeon: Laverle Hobby, MD;  Location: [facility] ORS;  Service: Pulmonary;  Laterality: Right;   ESOPHAGOGASTRODUODENOSCOPY (EGD) WITH PROPOFOL N/A 01/07/2017   Procedure: ESOPHAGOGASTRODUODENOSCOPY (EGD) WITH PROPOFOL;  Surgeon: Jonathon Bellows, MD;  Location: [facility] ENDOSCOPY;  Service: Gastroenterology;  Laterality: N/A;   LAPAROSCOPY N/A 03/01/2017   Procedure: LAPAROSCOPY DIAGNOSTIC;  Surgeon: Robert Bellow, MD;  Location: [facility] ORS;  Service: General;  Laterality: N/A;   PORTA CATH INSERTION N/A 08/14/2018   Procedure: PORTA CATH INSERTION;  Surgeon: Algernon Huxley, MD;  Location: [place] CV LAB;  Service: Cardiovascular;  Laterality: N/A;   TONSILECTOMY, ADENOIDECTOMY, BILATERAL MYRINGOTOMY AND TUBES  1955   TONSILLECTOMY     VENTRAL HERNIA REPAIR N/A 03/01/2017   10 x 14 CM Ventralight ST mesh, intraperitoneal location.    VENTRAL HERNIA REPAIR N/A 03/01/2017   Procedure: HERNIA REPAIR VENTRAL ADULT;  Surgeon: Robert Bellow, MD;  Location: [facility] ORS;  Service: General;  Laterality: N/A;    HEMATOLOGY/ONCOLOGY HISTORY:  Oncology History  Small cell lung cancer, right upper lobe ([place])  08/05/2018 Cancer Staging   Staging form: Lung, AJCC 8th Edition - Clinical stage from 08/05/2018: Stage IVB (cT3, cN2, cM1c) - Signed by Sindy Guadeloupe, MD on 08/06/2018   08/06/2018 Initial Diagnosis   Small cell lung cancer, right upper lobe ([place])   08/18/2018 -  Chemotherapy   Patient is on Treatment Plan : LUNG SCLC Carboplatin + Etoposide + Atezolizumab Induction q21d / Atezolizumab Maintenance q21d       ALLERGIES:  is allergic to bee venom.  MEDICATIONS:  Current Outpatient Medications  Medication Sig Dispense Refill   amLODipine (NORVASC) 5 MG tablet Take 1 tablet (5 mg total) by mouth daily. 90 tablet 3   buPROPion (WELLBUTRIN XL) 300 MG 24 hr tablet Take 1 tablet (300 mg total) by mouth daily. 90 tablet 1   clobetasol cream (TEMOVATE) 7.67 % Apply 1  application topically 2 (two) times daily. Lower back (Patient not taking: Reported on 07/19/2021) 60 g 1   erythromycin ophthalmic ointment Place 1 application. into the right eye daily as needed. (Patient not taking: Reported on 07/19/2021)     fluticasone (FLONASE) 50 MCG/ACT nasal spray Place 2 sprays into both nostrils daily. (Patient not taking: Reported on 03/17/4707) 16 g 6   folic acid (FOLVITE) 1 MG tablet Take 1 tablet by mouth once daily 90 tablet 0   ibuprofen (ADVIL,MOTRIN) 200 MG tablet Take 400 mg by mouth every 8 (eight) hours as needed for headache or moderate pain.  (Patient not taking: Reported on 07/19/2021)     levothyroxine (SYNTHROID) 100 MCG tablet Take 1 tablet (100 mcg total) by mouth daily before breakfast. 30 tablet 1   lidocaine-prilocaine (EMLA) cream Apply to affected area once (Patient not taking: Reported on 07/19/2021) 30 g 3   loperamide (IMODIUM) 2 MG capsule Take 1 capsule (2 mg total) by mouth every 2 (two) hours as needed for diarrhea or loose stools. Do not exceed 62m daily. (Patient not taking: Reported on 07/19/2021) 30 capsule 1   Multiple Vitamins-Minerals (PRESERVISION AREDS 2) CAPS Take 1 capsule by mouth 2 (two) times a day.      ondansetron (ZOFRAN ODT) 4 MG disintegrating tablet Take 1 tablet (4 mg total) by mouth every 8 (eight) hours as needed for nausea or vomiting. (Patient not taking: Reported on 07/19/2021) 20 tablet 0   pantoprazole (PROTONIX) 40 MG tablet TAKE 1 TABLET BY MOUTH TWICE DAILY BEFORE A MEAL 180 tablet 0   polyethylene glycol (MIRALAX / GLYCOLAX) 17 g packet Take 17 g by mouth daily as needed for mild constipation. (Patient not taking: Reported on 07/19/2021) 14 each 0   rivastigmine (EXELON) 1.5 MG capsule Take 1 capsule by mouth twice daily 60 capsule 0   rosuvastatin (CRESTOR) 20 MG tablet Take 1 tablet by mouth once daily 90 tablet 0   sertraline (ZOLOFT) 100 MG tablet Take 1 tablet (100 mg total) by mouth daily. 90 tablet 1   No  current facility-administered medications for this visit.   Facility-Administered Medications Ordered in Other Visits  Medication Dose Route Frequency Provider Last Rate Last Admin   denosumab (XGEVA) injection 120 mg  120 mg Subcutaneous Q30 days RSindy Guadeloupe MD   120 mg at 03/05/19 1525    VITAL SIGNS: There were no vitals taken for this visit. There were no vitals filed for this visit.  Estimated body mass index is 28.74 kg/m as calculated from the following:   Height as of an earlier encounter on 07/19/21: 5' 5"  (1.651 m).   Weight as of an earlier encounter on 07/19/21: 172 lb 11.2 oz (78.3 kg).  LABS: CBC:    Component Value Date/Time   WBC 5.6 07/08/2021 2213   HGB 11.1 (L) 07/08/2021 2213   HCT 36.1 07/08/2021 2213   PLT 173 07/08/2021 2213   MCV 94.8 07/08/2021 2213   NEUTROABS 3.6 07/07/2021 1128   LYMPHSABS 1.1 07/07/2021 1128   MONOABS 0.3 07/07/2021 1128   EOSABS 0.1 07/07/2021 1128   BASOSABS 0.0 07/07/2021  1128   Comprehensive Metabolic Panel:    Component Value Date/Time   NA 141 07/19/2021 0902   NA 142 07/17/2021 1347   K 3.1 (L) 07/19/2021 0902   CL 100 07/19/2021 0902   CO2 32 07/19/2021 0902   BUN 12 07/19/2021 0902   BUN 12 07/17/2021 1347   CREATININE 1.14 (H) 07/19/2021 0902   CREATININE 1.09 02/26/2012 0925   GLUCOSE 88 07/19/2021 0902   CALCIUM 9.1 07/19/2021 0902   AST 45 (H) 07/19/2021 0902   ALT 43 07/19/2021 0902   ALKPHOS 99 07/19/2021 0902   BILITOT 0.4 07/19/2021 0902   BILITOT 0.4 07/17/2021 1347   PROT 7.6 07/19/2021 0902   PROT 7.1 07/17/2021 1347   ALBUMIN 3.9 07/19/2021 0902   ALBUMIN 4.5 07/17/2021 1347    RADIOGRAPHIC STUDIES: DG HIP UNILAT W OR W/O PELVIS 2-3 VIEWS LEFT  Result Date: 07/07/2021 CLINICAL DATA:  Trauma, fall EXAM: DG HIP (WITH OR WITHOUT PELVIS) 2-3V LEFT COMPARISON:  None Available. FINDINGS: No fracture or dislocation is seen. Arterial calcifications are seen in the soft tissues. Degenerative changes  are noted in the lower lumbar spine. IMPRESSION: No fracture or dislocation is seen in the pelvis and left hip. Electronically Signed   By: Elmer Picker M.D.   On: 07/07/2021 12:16   CT HEAD WO CONTRAST (5MM)  Result Date: 07/07/2021 CLINICAL DATA:  Trauma, fall EXAM: CT HEAD WITHOUT CONTRAST TECHNIQUE: Contiguous axial images were obtained from the base of the skull through the vertex without intravenous contrast. RADIATION DOSE REDUCTION: This exam was performed according to the departmental dose-optimization program which includes automated exposure control, adjustment of the mA and/or kV according to patient size and/or use of iterative reconstruction technique. COMPARISON:  MR brain done on 06/21/2021, CT brain done on 10/15/2018 FINDINGS: Brain: No acute intracranial findings are seen. There are no signs of bleeding within the cranium. Ventricles are not dilated. Cortical sulci are prominent. Vascular: Scattered arterial calcifications are seen. Skull: No fracture is seen in the calvarium. Hyperostosis frontalis interna is seen. There is subcutaneous hematoma in the left posterior parietal scalp. Sinuses/Orbits: Small osteoma is seen in the left ethmoid sinus. Left mastoid is hypoplastic. Other: None. IMPRESSION: No acute intracranial findings are seen in noncontrast CT brain. Atrophy. Hematoma is seen in the left parietal scalp. No fracture is seen in the calvarium. Electronically Signed   By: Elmer Picker M.D.   On: 07/07/2021 12:15   MR Brain W Wo Contrast  Result Date: 06/21/2021 CLINICAL DATA:  Slurred speech 3 weeks. History of lung cancer. Rule out stroke or metastatic disease. EXAM: MRI HEAD WITHOUT AND WITH CONTRAST TECHNIQUE: Multiplanar, multiecho pulse sequences of the brain and surrounding structures were obtained without and with intravenous contrast. CONTRAST:  25m GADAVIST GADOBUTROL 1 MMOL/ML IV SOLN COMPARISON:  MRI head 08/09/2020 FINDINGS: Brain: Mild atrophy. Negative  for hydrocephalus. Mild white matter changes with scattered small white matter hyperintensities. Negative for acute infarct. Negative for hemorrhage or mass No enhancing lesions in the brain. Negative for metastatic disease to the brain. Vascular: Normal arterial flow voids at the skull base. Skull and upper cervical spine: Interval improvement in enhancing lesion in the right mandibular condyle with surrounding soft tissue enhancement. Sclerotic changes in the right mandibular condyle likely due to treated tumor. Surrounding soft tissue enhancement has improved in the interval. No new skeletal lesion identified Sinuses/Orbits: Paranasal sinuses clear. Bilateral cataract extraction Other: None IMPRESSION: Negative for acute infarct. Negative for metastatic disease  to brain Interval improvement in enhancing lesion the right mandibular condyle and surrounding soft tissues compatible with treated tumor. Electronically Signed   By: Franchot Gallo M.D.   On: 06/21/2021 19:04    PERFORMANCE STATUS (ECOG) : 2 - Symptomatic, <50% confined to bed  Review of Systems Unless otherwise noted, a complete review of systems is negative.  Physical Exam General: NAD Pulmonary: Unlabored Extremities: no edema, no joint deformities Skin: no rashes Neurological: Weakness but otherwise nonfocal  IMPRESSION: Met with patient and husband at Dr. Elroy Channel request.  Patient has had worsening memory recently and has been has concerns with patient staying at home alone as he still works during the day.  He asked about options for sitters.  Unfortunately, Medicare and private insurances do not cover sitter services.  He recognizes that he would likely have to pay out-of-pocket.  We will consult SW for more thorough discussion and exploration of resources. Will also consult community palliative care.   Of note, patient is no longer driving as she recently eloped in her car necessitating husband to contact the police and issue a  Silver Alert.  Reportedly, patient was in route to a "wedding."   MRI of the brain in 2023 did not reveal any evidence of metastatic disease.  Patient had an additional CT of the head on 07/07/2021 following a fall.  This also did not reveal any acute intracranial abnormalities.  Patient has also been referred to neurology for further work-up of memory loss.  PLAN: -Continue current scope of treatment -Referrals to community palliative care, social work, and rehab screening -We will benefit from future ACP conversation given extensive stage cancer -Follow-up telephone visit 1 month   Patient expressed understanding and was in agreement with this plan. She also understands that She can call the clinic at any time with any questions, concerns, or complaints.     Time Total: 15 minutes  Visit consisted of counseling and education dealing with the complex and emotionally intense issues of symptom management and palliative care in the setting of serious and potentially life-threatening illness.Greater than 50%  of this time was spent counseling and coordinating care related to the above assessment and plan.  Signed by: Altha Harm, PhD, NP-C

## 2021-07-19 NOTE — Progress Notes (Signed)
Archana  Did you see his BMP today when you saw him Creatinine was elevated , ? Needs fluids?  Bailey Mech

## 2021-07-21 ENCOUNTER — Telehealth: Payer: Self-pay | Admitting: *Deleted

## 2021-07-21 ENCOUNTER — Encounter: Payer: Self-pay | Admitting: Oncology

## 2021-07-21 ENCOUNTER — Telehealth: Payer: Self-pay | Admitting: Nurse Practitioner

## 2021-07-21 LAB — COMPREHENSIVE METABOLIC PANEL
ALT: 59 IU/L — ABNORMAL HIGH (ref 0–32)
AST: 58 IU/L — ABNORMAL HIGH (ref 0–40)
Albumin/Globulin Ratio: 1.7 (ref 1.2–2.2)
Albumin: 4.5 g/dL (ref 3.7–4.7)
Alkaline Phosphatase: 133 IU/L — ABNORMAL HIGH (ref 44–121)
BUN/Creatinine Ratio: 7 — ABNORMAL LOW (ref 12–28)
BUN: 12 mg/dL (ref 8–27)
Bilirubin Total: 0.4 mg/dL (ref 0.0–1.2)
CO2: 26 mmol/L (ref 20–29)
Calcium: 9.9 mg/dL (ref 8.7–10.3)
Chloride: 99 mmol/L (ref 96–106)
Creatinine, Ser: 1.61 mg/dL — ABNORMAL HIGH (ref 0.57–1.00)
Globulin, Total: 2.6 g/dL (ref 1.5–4.5)
Glucose: 98 mg/dL (ref 70–99)
Potassium: 3.2 mmol/L — ABNORMAL LOW (ref 3.5–5.2)
Sodium: 142 mmol/L (ref 134–144)
Total Protein: 7.1 g/dL (ref 6.0–8.5)
eGFR: 34 mL/min/{1.73_m2} — ABNORMAL LOW (ref >59)

## 2021-07-21 LAB — IRON,TIBC AND FERRITIN PANEL
Ferritin: 92 ng/mL (ref 15–150)
Iron Saturation: 15 % (ref 15–55)
Iron: 45 ug/dL (ref 27–139)
Total Iron Binding Capacity: 298 ug/dL (ref 250–450)
UIBC: 253 ug/dL (ref 118–369)

## 2021-07-21 LAB — CERULOPLASMIN: Ceruloplasmin: 29.2 mg/dL (ref 19.0–39.0)

## 2021-07-21 LAB — HEPATITIS B SURFACE ANTIGEN: Hepatitis B Surface Ag: NEGATIVE

## 2021-07-21 LAB — HEPATITIS C ANTIBODY: Hep C Virus Ab: NONREACTIVE

## 2021-07-21 LAB — CELIAC DISEASE AB SCREEN W/RFX
Antigliadin Abs, IgA: 21 units — ABNORMAL HIGH (ref 0–19)
Transglutaminase IgA: <2 U/mL (ref 0–3)

## 2021-07-21 LAB — IMMUNOGLOBULINS A/E/G/M, SERUM
IgA/Immunoglobulin A, Serum: 185 mg/dL (ref 64–422)
IgE (Immunoglobulin E), Serum: 143 IU/mL (ref 6–495)
IgG (Immunoglobin G), Serum: 976 mg/dL (ref 586–1602)
IgM (Immunoglobulin M), Srm: 536 mg/dL — ABNORMAL HIGH (ref 26–217)

## 2021-07-21 LAB — ALPHA-1-ANTITRYPSIN: A-1 Antitrypsin: 173 mg/dL (ref 101–187)

## 2021-07-21 LAB — HEPATITIS B E ANTIBODY: Hep B E Ab: NEGATIVE

## 2021-07-21 LAB — PROTIME-INR
INR: 1 (ref 0.9–1.2)
Prothrombin Time: 10.8 s (ref 9.1–12.0)

## 2021-07-21 LAB — MITOCHONDRIAL/SMOOTH MUSCLE AB PNL
Mitochondrial Ab: <20 Units (ref 0.0–20.0)
Smooth Muscle Ab: 7 Units (ref 0–19)

## 2021-07-21 LAB — HEPATITIS A ANTIBODY, TOTAL: hep A Total Ab: NEGATIVE

## 2021-07-21 LAB — CK: Total CK: 302 U/L — ABNORMAL HIGH (ref 32–182)

## 2021-07-21 LAB — ANTI-MICROSOMAL ANTIBODY LIVER / KIDNEY: LKM1 Ab: 0.8 Units (ref 0.0–20.0)

## 2021-07-21 LAB — HEPATITIS B E ANTIGEN: Hep B E Ag: NEGATIVE

## 2021-07-21 LAB — HEPATITIS B CORE ANTIBODY, TOTAL: Hep B Core Total Ab: NEGATIVE

## 2021-07-21 LAB — HEPATITIS B SURFACE ANTIBODY,QUALITATIVE: Hep B Surface Ab, Qual: REACTIVE

## 2021-07-21 LAB — HIV ANTIBODY (ROUTINE TESTING W REFLEX): HIV Screen 4th Generation wRfx: NONREACTIVE

## 2021-07-21 LAB — ANA: Anti Nuclear Antibody (ANA): NEGATIVE

## 2021-07-21 NOTE — Telephone Encounter (Signed)
I called Anne [name] today . I could not get in touch with pt or husband. Marian Sorrow said that she would bring them to her today and start them

## 2021-07-21 NOTE — Telephone Encounter (Signed)
Attempted to contact patient at her home and cell #, with no answer and no VM set up.  I then attempted to contact her husband [name] on his cell, no answer - left message with reason for call along with my name and call back number requesting a return call to schedule visit and also to answer any questions that they may have regarding Palliative services.

## 2021-07-22 ENCOUNTER — Emergency Department: Payer: PPO

## 2021-07-22 ENCOUNTER — Encounter: Payer: Self-pay | Admitting: Emergency Medicine

## 2021-07-22 ENCOUNTER — Other Ambulatory Visit: Payer: Self-pay

## 2021-07-22 DIAGNOSIS — I10 Essential (primary) hypertension: Secondary | ICD-10-CM | POA: Diagnosis not present

## 2021-07-22 DIAGNOSIS — S3991XA Unspecified injury of abdomen, initial encounter: Secondary | ICD-10-CM | POA: Diagnosis not present

## 2021-07-22 DIAGNOSIS — Y92009 Unspecified place in unspecified non-institutional (private) residence as the place of occurrence of the external cause: Secondary | ICD-10-CM | POA: Insufficient documentation

## 2021-07-22 DIAGNOSIS — Z85118 Personal history of other malignant neoplasm of bronchus and lung: Secondary | ICD-10-CM | POA: Diagnosis not present

## 2021-07-22 DIAGNOSIS — Y9301 Activity, walking, marching and hiking: Secondary | ICD-10-CM | POA: Insufficient documentation

## 2021-07-22 DIAGNOSIS — S022XXA Fracture of nasal bones, initial encounter for closed fracture: Secondary | ICD-10-CM | POA: Diagnosis not present

## 2021-07-22 DIAGNOSIS — S300XXA Contusion of lower back and pelvis, initial encounter: Secondary | ICD-10-CM | POA: Diagnosis not present

## 2021-07-22 DIAGNOSIS — J9811 Atelectasis: Secondary | ICD-10-CM | POA: Diagnosis not present

## 2021-07-22 DIAGNOSIS — Z20822 Contact with and (suspected) exposure to covid-19: Secondary | ICD-10-CM | POA: Diagnosis not present

## 2021-07-22 DIAGNOSIS — S0003XA Contusion of scalp, initial encounter: Secondary | ICD-10-CM | POA: Diagnosis not present

## 2021-07-22 DIAGNOSIS — Z043 Encounter for examination and observation following other accident: Secondary | ICD-10-CM | POA: Diagnosis not present

## 2021-07-22 DIAGNOSIS — F0390 Unspecified dementia without behavioral disturbance: Secondary | ICD-10-CM | POA: Diagnosis not present

## 2021-07-22 DIAGNOSIS — R9431 Abnormal electrocardiogram [ECG] [EKG]: Secondary | ICD-10-CM | POA: Diagnosis not present

## 2021-07-22 DIAGNOSIS — W010XXA Fall on same level from slipping, tripping and stumbling without subsequent striking against object, initial encounter: Secondary | ICD-10-CM | POA: Insufficient documentation

## 2021-07-22 DIAGNOSIS — M5136 Other intervertebral disc degeneration, lumbar region: Secondary | ICD-10-CM | POA: Diagnosis not present

## 2021-07-22 DIAGNOSIS — R0902 Hypoxemia: Secondary | ICD-10-CM | POA: Diagnosis not present

## 2021-07-22 DIAGNOSIS — S20219A Contusion of unspecified front wall of thorax, initial encounter: Secondary | ICD-10-CM | POA: Diagnosis not present

## 2021-07-22 DIAGNOSIS — R079 Chest pain, unspecified: Secondary | ICD-10-CM | POA: Diagnosis not present

## 2021-07-22 DIAGNOSIS — S3992XA Unspecified injury of lower back, initial encounter: Secondary | ICD-10-CM | POA: Diagnosis present

## 2021-07-22 DIAGNOSIS — M545 Low back pain, unspecified: Secondary | ICD-10-CM | POA: Diagnosis not present

## 2021-07-22 DIAGNOSIS — M4126 Other idiopathic scoliosis, lumbar region: Secondary | ICD-10-CM | POA: Diagnosis not present

## 2021-07-22 LAB — CBC WITH DIFFERENTIAL/PLATELET
Abs Immature Granulocytes: 0.01 10*3/uL (ref 0.00–0.07)
Basophils Absolute: 0.1 10*3/uL (ref 0.0–0.1)
Basophils Relative: 1 %
Eosinophils Absolute: 0.1 10*3/uL (ref 0.0–0.5)
Eosinophils Relative: 1 %
HCT: 34.5 % — ABNORMAL LOW (ref 36.0–46.0)
Hemoglobin: 10.7 g/dL — ABNORMAL LOW (ref 12.0–15.0)
Immature Granulocytes: 0 %
Lymphocytes Relative: 22 %
Lymphs Abs: 1.3 10*3/uL (ref 0.7–4.0)
MCH: 29.6 pg (ref 26.0–34.0)
MCHC: 31 g/dL (ref 30.0–36.0)
MCV: 95.3 fL (ref 80.0–100.0)
Monocytes Absolute: 0.4 10*3/uL (ref 0.1–1.0)
Monocytes Relative: 7 %
Neutro Abs: 4.1 10*3/uL (ref 1.7–7.7)
Neutrophils Relative %: 69 %
Platelets: 200 10*3/uL (ref 150–400)
RBC: 3.62 MIL/uL — ABNORMAL LOW (ref 3.87–5.11)
RDW: 17 % — ABNORMAL HIGH (ref 11.5–15.5)
WBC: 6 10*3/uL (ref 4.0–10.5)
nRBC: 0 % (ref 0.0–0.2)

## 2021-07-22 LAB — COMPREHENSIVE METABOLIC PANEL
ALT: 61 U/L — ABNORMAL HIGH (ref 0–44)
AST: 68 U/L — ABNORMAL HIGH (ref 15–41)
Albumin: 4 g/dL (ref 3.5–5.0)
Alkaline Phosphatase: 112 U/L (ref 38–126)
Anion gap: 10 (ref 5–15)
BUN: 16 mg/dL (ref 8–23)
CO2: 30 mmol/L (ref 22–32)
Calcium: 9.7 mg/dL (ref 8.9–10.3)
Chloride: 103 mmol/L (ref 98–111)
Creatinine, Ser: 1.21 mg/dL — ABNORMAL HIGH (ref 0.44–1.00)
GFR, Estimated: 47 mL/min — ABNORMAL LOW (ref >60)
Glucose, Bld: 85 mg/dL (ref 70–99)
Potassium: 3.2 mmol/L — ABNORMAL LOW (ref 3.5–5.1)
Sodium: 143 mmol/L (ref 135–145)
Total Bilirubin: 0.7 mg/dL (ref 0.3–1.2)
Total Protein: 7.9 g/dL (ref 6.5–8.1)

## 2021-07-22 LAB — TROPONIN I (HIGH SENSITIVITY): Troponin I (High Sensitivity): 4 ng/L (ref <18)

## 2021-07-22 NOTE — ED Triage Notes (Signed)
Pt to ED via POV with c/o fall, pt's family member reports that patient slipped on slick flooring. Pt denies LOC, denies hitting head. Pt with abrasion to L forearm, and to posterior back.   Pt's husband reports hx of dementia.   Pt RA in triage noted to be 88-89%, pt's husband denies use of oxygen at home. Pt placed on 2L via [place] in triage.

## 2021-07-23 ENCOUNTER — Emergency Department
Admission: EM | Admit: 2021-07-23 | Discharge: 2021-07-23 | Disposition: A | Discharge status: Home / Self Care | Payer: PPO | Attending: Emergency Medicine | Admitting: Emergency Medicine

## 2021-07-23 ENCOUNTER — Emergency Department: Payer: PPO

## 2021-07-23 DIAGNOSIS — S20219A Contusion of unspecified front wall of thorax, initial encounter: Secondary | ICD-10-CM | POA: Diagnosis not present

## 2021-07-23 DIAGNOSIS — J9811 Atelectasis: Secondary | ICD-10-CM | POA: Diagnosis not present

## 2021-07-23 DIAGNOSIS — W19XXXA Unspecified fall, initial encounter: Secondary | ICD-10-CM

## 2021-07-23 DIAGNOSIS — S300XXA Contusion of lower back and pelvis, initial encounter: Secondary | ICD-10-CM

## 2021-07-23 DIAGNOSIS — S3991XA Unspecified injury of abdomen, initial encounter: Secondary | ICD-10-CM | POA: Diagnosis not present

## 2021-07-23 DIAGNOSIS — M5136 Other intervertebral disc degeneration, lumbar region: Secondary | ICD-10-CM | POA: Diagnosis not present

## 2021-07-23 LAB — TROPONIN I (HIGH SENSITIVITY): Troponin I (High Sensitivity): 3 ng/L (ref <18)

## 2021-07-23 LAB — SARS CORONAVIRUS 2 BY RT PCR: SARS Coronavirus 2 by RT PCR: NEGATIVE

## 2021-07-23 MED ORDER — IOHEXOL 350 MG/ML SOLN
80.0000 mL | Freq: Once | INTRAVENOUS | Status: AC | PRN
  Administered 2021-07-23: 80 mL via INTRAVENOUS

## 2021-07-23 MED ORDER — ACETAMINOPHEN 500 MG PO TABS
1000.0000 mg | ORAL_TABLET | Freq: Once | ORAL | Status: AC
  Administered 2021-07-23: 1000 mg via ORAL
  Filled 2021-07-23: qty 2

## 2021-07-23 NOTE — ED Provider Notes (Signed)
[facility] Provider Note    Event Date/Time   First MD Initiated Contact with Patient 07/23/21 0030     (approximate)   History   Fall   HPI  Anne [name] is a 71 y.o. female with a history of metastatic lung cancer, dementia, cirrhosis, hypertension, hyperlipidemia who presents for evaluation after mechanical fall.  Patient was walking at home with socks on the hardwood floor when she slipped and fell backwards.  Patient is complaining of back pain from the fall.  Also has an abrasion to the right side of her face and an abrasion to the left forearm.  She denies LOC.  She is not on blood thinners.  Patient was found to be hypoxic in the waiting room.  She denies any oxygen requirement at home.  She denies any shortness of breath or chest pain.     Past Medical History:  Diagnosis Date   Arthritis    Cirrhosis ([place])    Dementia ([place])    Depression    GERD (gastroesophageal reflux disease)    Hyperlipidemia    Hypertension    Lung cancer ([place])    Metastatic bone cancer     Past Surgical History:  Procedure Laterality Date   APPENDECTOMY  1971   CHOLECYSTECTOMY  1971   COLONOSCOPY WITH PROPOFOL N/A 11/15/2016   Procedure: COLONOSCOPY WITH PROPOFOL;  Surgeon: Jonathon Bellows, MD;  Location: [facility] ENDOSCOPY;  Service: Gastroenterology;  Laterality: N/A;   ENDOBRONCHIAL ULTRASOUND Right 07/30/2018   Procedure: ENDOBRONCHIAL ULTRASOUND;  Surgeon: Laverle Hobby, MD;  Location: [facility] ORS;  Service: Pulmonary;  Laterality: Right;   ESOPHAGOGASTRODUODENOSCOPY (EGD) WITH PROPOFOL N/A 01/07/2017   Procedure: ESOPHAGOGASTRODUODENOSCOPY (EGD) WITH PROPOFOL;  Surgeon: Jonathon Bellows, MD;  Location: [facility] ENDOSCOPY;  Service: Gastroenterology;  Laterality: N/A;   LAPAROSCOPY N/A 03/01/2017   Procedure: LAPAROSCOPY DIAGNOSTIC;  Surgeon: Robert Bellow, MD;  Location: [facility] ORS;  Service: General;  Laterality: N/A;   PORTA CATH INSERTION N/A 08/14/2018   Procedure:  PORTA CATH INSERTION;  Surgeon: Algernon Huxley, MD;  Location: [facility];  Service: Cardiovascular;  Laterality: N/A;   TONSILECTOMY, ADENOIDECTOMY, BILATERAL MYRINGOTOMY AND TUBES  1955   TONSILLECTOMY     VENTRAL HERNIA REPAIR N/A 03/01/2017   10 x 14 CM Ventralight ST mesh, intraperitoneal location.    VENTRAL HERNIA REPAIR N/A 03/01/2017   Procedure: HERNIA REPAIR VENTRAL ADULT;  Surgeon: Robert Bellow, MD;  Location: [facility] ORS;  Service: General;  Laterality: N/A;     Physical Exam   Triage Vital Signs: ED Triage Vitals [07/22/21 2041]  Enc Vitals Group     BP (!) 158/95     Pulse Rate 78     Resp 20     Temp 98.6 F (37 C)     Temp Source Oral     SpO2 (!) 88 %     Weight 172 lb 11.2 oz (78.3 kg)     Height 5\' 5"  (1.651 m)     Head Circumference      Peak Flow      Pain Score      Pain Loc      Pain Edu?      Excl. in [place]?     Most recent vital signs: Vitals:   07/23/21 0130 07/23/21 0230  BP: 137/81 (!) 124/91  Pulse: 76 73  Resp: 14 (!) 22  Temp:    SpO2: 97% 97%    Full spinal precautions  maintained throughout the trauma exam. Constitutional: Alert and oriented. No acute distress. Does not appear intoxicated. HEENT Head: Normocephalic and atraumatic. Face: Bruising to the R side of the face. No facial bony tenderness. Stable midface Ears: No hemotympanum bilaterally. No Battle sign Eyes: No eye injury. PERRL. No raccoon eyes Nose: Nontender. No epistaxis. No rhinorrhea Mouth/Throat: Mucous membranes are moist. No oropharyngeal blood. No dental injury. Airway patent without stridor. Normal voice. Neck: no C-collar. No midline c-spine tenderness.  Cardiovascular: Normal rate, regular rhythm. Normal and symmetric distal pulses are present in all extremities. Pulmonary/Chest: Chest wall is stable and nontender to palpation/compression. Normal respiratory effort. Breath sounds are normal. No crepitus.  Abdominal: Soft, nontender, non  distended. Musculoskeletal: Large bruise with overlying abrasion on the R mid back. Nontender with normal full range of motion in all extremities. No deformities. No thoracic or lumbar midline spinal tenderness. Pelvis is stable. Skin: Skin is warm, dry and intact. No abrasions or contutions. Psychiatric: Speech and behavior are appropriate. Neurological: Normal speech and language. Moves all extremities to command. No gross focal neurologic deficits are appreciated.  Glascow Coma Score: 4 - Opens eyes on own 6 - Follows simple motor commands 5 - Alert and oriented GCS: 15   ED Results / Procedures / Treatments   Labs (all labs ordered are listed, but only abnormal results are displayed) Labs Reviewed  CBC WITH DIFFERENTIAL/PLATELET - Abnormal; Notable for the following components:      Result Value   RBC 3.62 (*)    Hemoglobin 10.7 (*)    HCT 34.5 (*)    RDW 17.0 (*)    All other components within normal limits  COMPREHENSIVE METABOLIC PANEL - Abnormal; Notable for the following components:   Potassium 3.2 (*)    Creatinine, Ser 1.21 (*)    AST 68 (*)    ALT 61 (*)    GFR, Estimated 47 (*)    All other components within normal limits  SARS CORONAVIRUS 2 BY RT PCR  URINALYSIS, COMPLETE (UACMP) WITH MICROSCOPIC  TROPONIN I (HIGH SENSITIVITY)  TROPONIN I (HIGH SENSITIVITY)     EKG  ED ECG REPORT I, Rudene Re, the attending physician, personally viewed and interpreted this ECG.  Sinus rhythm with a rate of 77, normal intervals, no ST elevations or depressions  RADIOLOGY I, Rudene Re, attending MD, have personally viewed and interpreted the images obtained during this visit as below:  All imaging including CT chest, C-spine, L-spine, abdomen pelvis and x-rays were reviewed by me with no acute traumatic injury other than a subcutaneous hematoma on her back   ___________________________________________________ Interpretation by Radiologist:  CT T-SPINE  NO CHARGE  Result Date: 07/23/2021 CLINICAL DATA:  Fall. EXAM: CT THORACIC AND LUMBAR SPINE WITHOUT CONTRAST TECHNIQUE: Multidetector CT imaging of the thoracic and lumbar spine was performed without contrast. Multiplanar CT image reconstructions were also generated. RADIATION DOSE REDUCTION: This exam was performed according to the departmental dose-optimization program which includes automated exposure control, adjustment of the mA and/or kV according to patient size and/or use of iterative reconstruction technique. COMPARISON:  CT of the chest abdomen pelvis dated 07/23/2021 and 03/01/2021. FINDINGS: CT THORACIC SPINE FINDINGS Alignment: No acute subluxation. Vertebrae: No acute fracture. Old T2 compression fracture. Osteopenia. Paraspinal and other soft tissues: No paraspinal fluid collection. Subcutaneous hematoma in the lower posterior chest wall. Disc levels: No acute findings.  Degenerative changes. CT LUMBAR SPINE FINDINGS Segmentation: 5 lumbar type vertebrae. Alignment: No acute subluxation.  Grade 1 L4-L5  anterolisthesis. Vertebrae: No acute fractures. Multilevel chronic compression fractures most prominent at L2. There is complete loss of vertebral body height centrally at this level. Paraspinal and other soft tissues: No paraspinal fluid collection. Disc levels: Multilevel degenerative changes with disc desiccation and vacuum phenomena. IMPRESSION: 1. No acute/traumatic thoracic or lumbar spine pathology. 2. Multilevel degenerative changes and chronic compression fractures. Electronically Signed   By: Anner Crete M.D.   On: 07/23/2021 02:30   CT L-SPINE NO CHARGE  Result Date: 07/23/2021 CLINICAL DATA:  Fall. EXAM: CT THORACIC AND LUMBAR SPINE WITHOUT CONTRAST TECHNIQUE: Multidetector CT imaging of the thoracic and lumbar spine was performed without contrast. Multiplanar CT image reconstructions were also generated. RADIATION DOSE REDUCTION: This exam was performed according to the  departmental dose-optimization program which includes automated exposure control, adjustment of the mA and/or kV according to patient size and/or use of iterative reconstruction technique. COMPARISON:  CT of the chest abdomen pelvis dated 07/23/2021 and 03/01/2021. FINDINGS: CT THORACIC SPINE FINDINGS Alignment: No acute subluxation. Vertebrae: No acute fracture. Old T2 compression fracture. Osteopenia. Paraspinal and other soft tissues: No paraspinal fluid collection. Subcutaneous hematoma in the lower posterior chest wall. Disc levels: No acute findings.  Degenerative changes. CT LUMBAR SPINE FINDINGS Segmentation: 5 lumbar type vertebrae. Alignment: No acute subluxation.  Grade 1 L4-L5 anterolisthesis. Vertebrae: No acute fractures. Multilevel chronic compression fractures most prominent at L2. There is complete loss of vertebral body height centrally at this level. Paraspinal and other soft tissues: No paraspinal fluid collection. Disc levels: Multilevel degenerative changes with disc desiccation and vacuum phenomena. IMPRESSION: 1. No acute/traumatic thoracic or lumbar spine pathology. 2. Multilevel degenerative changes and chronic compression fractures. Electronically Signed   By: Anner Crete M.D.   On: 07/23/2021 02:30   CT Angio Chest PE W and/or Wo Contrast  Result Date: 07/23/2021 CLINICAL DATA:  Trauma. EXAM: CT ANGIOGRAPHY CHEST CT ABDOMEN AND PELVIS WITH CONTRAST TECHNIQUE: Multidetector CT imaging of the chest was performed using the standard protocol during bolus administration of intravenous contrast. Multiplanar CT image reconstructions and MIPs were obtained to evaluate the vascular anatomy. Multidetector CT imaging of the abdomen and pelvis was performed using the standard protocol during bolus administration of intravenous contrast. RADIATION DOSE REDUCTION: This exam was performed according to the departmental dose-optimization program which includes automated exposure control,  adjustment of the mA and/or kV according to patient size and/or use of iterative reconstruction technique. CONTRAST:  62mL OMNIPAQUE IOHEXOL 350 MG/ML SOLN COMPARISON:  Chest radiograph dated 07/22/2021. FINDINGS: CTA CHEST FINDINGS Cardiovascular: There is no cardiomegaly or pericardial effusion. There is 3 vessel coronary vascular calcification. Moderate atherosclerotic calcification of the thoracic aorta. No aneurysmal dilatation. The origins of the great vessels of the aortic arch appear patent. No pulmonary artery embolus identified. Mediastinum/Nodes: There is no hilar or mediastinal adenopathy. The esophagus is grossly unremarkable. No mediastinal fluid collection. Right-sided Port-A-Cath with tip at the cavoatrial junction. Lungs/Pleura: Minimal bibasilar dependent atelectasis. No focal consolidation, pleural effusion, or pneumothorax. The central airways are patent. Musculoskeletal: No acute osseous pathology. There is contusion of the subcutaneous soft tissues of the posterior lower chest wall with a 2 x 9 cm subcutaneous hematoma. Degenerative changes of the spine. Review of the MIP images confirms the above findings. CT ABDOMEN and PELVIS FINDINGS No intra-abdominal free air or free fluid. Hepatobiliary: There is irregularity of the liver contour consistent with cirrhosis. Cholecystectomy. Mild dilatation of the common bile duct, likely post cholecystectomy. No retained calcified stone noted in  the central CBD. Pancreas: Unremarkable. No pancreatic ductal dilatation or surrounding inflammatory changes. Spleen: Normal in size without focal abnormality. Adrenals/Urinary Tract: The adrenal glands unremarkable. Subcentimeter left renal hypodense lesions are too small to characterize. There is no hydronephrosis on either side. There is symmetric enhancement and excretion of contrast by both kidneys. The visualized ureters and urinary bladder appear unremarkable. Stomach/Bowel: There is no bowel obstruction or  active inflammation. Appendectomy. Vascular/Lymphatic: Advanced aortoiliac atherosclerotic disease. There is a 2.2 cm infrarenal aortic ectasia. The IVC is unremarkable. No portal venous gas. There is no adenopathy. Reproductive: The uterus is anteverted. Probable small uterine fibroids. No adnexal masses. Other: Postsurgical changes of the anterior peritoneal wall, likely hernia repair. Musculoskeletal: Osteopenia with extensive degenerative changes of the spine. Grade 1 L4-L5 anterolisthesis. Chronic appearing L1 and L2 compression fractures. There is complete loss of vertebral body height centrally at L2. Multilevel disc desiccation and vacuum phenomena. No acute osseous pathology. Review of the MIP images confirms the above findings. IMPRESSION: 1. No acute intrathoracic, abdominal, or pelvic pathology. 2. No CT evidence of pulmonary embolism. 3. Contusion of the subcutaneous soft tissues of the posterior lower chest wall with a 2 x 9 cm subcutaneous hematoma. 4. Cirrhosis. 5. Aortic Atherosclerosis (ICD10-I70.0). Electronically Signed   By: Anner Crete M.D.   On: 07/23/2021 02:25   CT ABDOMEN PELVIS W CONTRAST  Result Date: 07/23/2021 CLINICAL DATA:  Trauma. EXAM: CT ANGIOGRAPHY CHEST CT ABDOMEN AND PELVIS WITH CONTRAST TECHNIQUE: Multidetector CT imaging of the chest was performed using the standard protocol during bolus administration of intravenous contrast. Multiplanar CT image reconstructions and MIPs were obtained to evaluate the vascular anatomy. Multidetector CT imaging of the abdomen and pelvis was performed using the standard protocol during bolus administration of intravenous contrast. RADIATION DOSE REDUCTION: This exam was performed according to the departmental dose-optimization program which includes automated exposure control, adjustment of the mA and/or kV according to patient size and/or use of iterative reconstruction technique. CONTRAST:  22mL OMNIPAQUE IOHEXOL 350 MG/ML SOLN  COMPARISON:  Chest radiograph dated 07/22/2021. FINDINGS: CTA CHEST FINDINGS Cardiovascular: There is no cardiomegaly or pericardial effusion. There is 3 vessel coronary vascular calcification. Moderate atherosclerotic calcification of the thoracic aorta. No aneurysmal dilatation. The origins of the great vessels of the aortic arch appear patent. No pulmonary artery embolus identified. Mediastinum/Nodes: There is no hilar or mediastinal adenopathy. The esophagus is grossly unremarkable. No mediastinal fluid collection. Right-sided Port-A-Cath with tip at the cavoatrial junction. Lungs/Pleura: Minimal bibasilar dependent atelectasis. No focal consolidation, pleural effusion, or pneumothorax. The central airways are patent. Musculoskeletal: No acute osseous pathology. There is contusion of the subcutaneous soft tissues of the posterior lower chest wall with a 2 x 9 cm subcutaneous hematoma. Degenerative changes of the spine. Review of the MIP images confirms the above findings. CT ABDOMEN and PELVIS FINDINGS No intra-abdominal free air or free fluid. Hepatobiliary: There is irregularity of the liver contour consistent with cirrhosis. Cholecystectomy. Mild dilatation of the common bile duct, likely post cholecystectomy. No retained calcified stone noted in the central CBD. Pancreas: Unremarkable. No pancreatic ductal dilatation or surrounding inflammatory changes. Spleen: Normal in size without focal abnormality. Adrenals/Urinary Tract: The adrenal glands unremarkable. Subcentimeter left renal hypodense lesions are too small to characterize. There is no hydronephrosis on either side. There is symmetric enhancement and excretion of contrast by both kidneys. The visualized ureters and urinary bladder appear unremarkable. Stomach/Bowel: There is no bowel obstruction or active inflammation. Appendectomy. Vascular/Lymphatic: Advanced aortoiliac atherosclerotic disease.  There is a 2.2 cm infrarenal aortic ectasia. The IVC is  unremarkable. No portal venous gas. There is no adenopathy. Reproductive: The uterus is anteverted. Probable small uterine fibroids. No adnexal masses. Other: Postsurgical changes of the anterior peritoneal wall, likely hernia repair. Musculoskeletal: Osteopenia with extensive degenerative changes of the spine. Grade 1 L4-L5 anterolisthesis. Chronic appearing L1 and L2 compression fractures. There is complete loss of vertebral body height centrally at L2. Multilevel disc desiccation and vacuum phenomena. No acute osseous pathology. Review of the MIP images confirms the above findings. IMPRESSION: 1. No acute intrathoracic, abdominal, or pelvic pathology. 2. No CT evidence of pulmonary embolism. 3. Contusion of the subcutaneous soft tissues of the posterior lower chest wall with a 2 x 9 cm subcutaneous hematoma. 4. Cirrhosis. 5. Aortic Atherosclerosis (ICD10-I70.0). Electronically Signed   By: Anner Crete M.D.   On: 07/23/2021 02:25   DG Lumbar Spine Complete  Result Date: 07/22/2021 CLINICAL DATA:  Low back pain after a fall. EXAM: LUMBAR SPINE - COMPLETE 4+ VIEW COMPARISON:  Flexion and extension lumbar spine 11/14/2009. MRI lumbar spine 07/04/2018 FINDINGS: Five lumbar type vertebral bodies. Mild lumbar scoliosis convex towards the left. Slight anterior subluxation at L4-5 is stable since prior study. There is compression of the superior endplate of L2 with mild retropulsion of fracture fragments. Lucent lesions in the L1, L2 and L3 vertebrae. These changes correspond with metastatic lesions seen on prior MRI. Known metastatic lesion at the sacrum is less well demonstrated on plain radiographs in comparison to the prior MRI. The compression deformity at L2 appears mildly progressed since the prior MRI. Degenerative changes with disc space narrowing and endplate osteophyte formation throughout. Degenerative changes in the posterior facet joints. Vascular calcification in the aorta. IMPRESSION: 1.  Metastatic lesions again demonstrated in the lumbar spine. Progression of compression at L2 since the prior MRI. 2. Mild lumbar scoliosis and moderate degenerative changes. Electronically Signed   By: Lucienne Capers M.D.   On: 07/22/2021 22:20   DG Chest 2 View  Result Date: 07/22/2021 CLINICAL DATA:  Hypoxia.  Low back pain after a fall. EXAM: CHEST - 2 VIEW COMPARISON:  CT 03/01/2021 FINDINGS: Power port type central venous catheter with tip over the cavoatrial junction region. No pneumothoraces. Shallow inspiration with atelectasis in the lung bases. No pleural effusions. No pneumothorax. Heart size and pulmonary vascularity are normal. Calcification of the aorta. Degenerative changes in the spine and shoulders. IMPRESSION: Shallow inspiration with atelectasis in the lung bases. Aortic atherosclerosis. Electronically Signed   By: Lucienne Capers M.D.   On: 07/22/2021 22:15   CT Head Wo Contrast  Result Date: 07/22/2021 CLINICAL DATA:  Fall. EXAM: CT HEAD WITHOUT CONTRAST CT MAXILLOFACIAL WITHOUT CONTRAST CT CERVICAL SPINE WITHOUT CONTRAST TECHNIQUE: Multidetector CT imaging of the head, cervical spine, and maxillofacial structures were performed using the standard protocol without intravenous contrast. Multiplanar CT image reconstructions of the cervical spine and maxillofacial structures were also generated. RADIATION DOSE REDUCTION: This exam was performed according to the departmental dose-optimization program which includes automated exposure control, adjustment of the mA and/or kV according to patient size and/or use of iterative reconstruction technique. COMPARISON:  Head CT dated 07/07/2021. FINDINGS: CT HEAD FINDINGS Brain: Mild age-related atrophy and chronic microvascular ischemic changes. There is no acute intracranial hemorrhage. No mass effect or midline shift. No extra-axial fluid collection. Vascular: No hyperdense vessel or unexpected calcification. Skull: Normal. Negative for fracture  or focal lesion. Other: Small right frontal scalp contusion. CT MAXILLOFACIAL  FINDINGS Osseous: There is an age indeterminate fracture of the nasal bone with mild deviation of the nose to the right. Clinical correlation is recommended. No other acute fracture. No mandibular subluxation. Orbits: Negative. No traumatic or inflammatory finding. Sinuses: Clear. Soft tissues: Negative. CT CERVICAL SPINE FINDINGS Alignment: No acute subluxation. Skull base and vertebrae: No acute fracture. Soft tissues and spinal canal: No prevertebral fluid or swelling. No visible canal hematoma. Disc levels:  Multilevel degenerative changes. Upper chest: Partially visualized right-sided central venous line. Other: Bilateral carotid bulb calcified plaques. IMPRESSION: 1. No acute intracranial pathology. Mild age-related atrophy and chronic microvascular ischemic changes. 2. No acute/traumatic cervical spine pathology. 3. Age indeterminate fracture of the nasal bone with mild deviation of the nose to the right. Electronically Signed   By: Anner Crete M.D.   On: 07/22/2021 21:50   CT Cervical Spine Wo Contrast  Result Date: 07/22/2021 CLINICAL DATA:  Fall. EXAM: CT HEAD WITHOUT CONTRAST CT MAXILLOFACIAL WITHOUT CONTRAST CT CERVICAL SPINE WITHOUT CONTRAST TECHNIQUE: Multidetector CT imaging of the head, cervical spine, and maxillofacial structures were performed using the standard protocol without intravenous contrast. Multiplanar CT image reconstructions of the cervical spine and maxillofacial structures were also generated. RADIATION DOSE REDUCTION: This exam was performed according to the departmental dose-optimization program which includes automated exposure control, adjustment of the mA and/or kV according to patient size and/or use of iterative reconstruction technique. COMPARISON:  Head CT dated 07/07/2021. FINDINGS: CT HEAD FINDINGS Brain: Mild age-related atrophy and chronic microvascular ischemic changes. There is no  acute intracranial hemorrhage. No mass effect or midline shift. No extra-axial fluid collection. Vascular: No hyperdense vessel or unexpected calcification. Skull: Normal. Negative for fracture or focal lesion. Other: Small right frontal scalp contusion. CT MAXILLOFACIAL FINDINGS Osseous: There is an age indeterminate fracture of the nasal bone with mild deviation of the nose to the right. Clinical correlation is recommended. No other acute fracture. No mandibular subluxation. Orbits: Negative. No traumatic or inflammatory finding. Sinuses: Clear. Soft tissues: Negative. CT CERVICAL SPINE FINDINGS Alignment: No acute subluxation. Skull base and vertebrae: No acute fracture. Soft tissues and spinal canal: No prevertebral fluid or swelling. No visible canal hematoma. Disc levels:  Multilevel degenerative changes. Upper chest: Partially visualized right-sided central venous line. Other: Bilateral carotid bulb calcified plaques. IMPRESSION: 1. No acute intracranial pathology. Mild age-related atrophy and chronic microvascular ischemic changes. 2. No acute/traumatic cervical spine pathology. 3. Age indeterminate fracture of the nasal bone with mild deviation of the nose to the right. Electronically Signed   By: Anner Crete M.D.   On: 07/22/2021 21:50   CT MAXILLOFACIAL WO CONTRAST  Result Date: 07/22/2021 CLINICAL DATA:  Fall. EXAM: CT HEAD WITHOUT CONTRAST CT MAXILLOFACIAL WITHOUT CONTRAST CT CERVICAL SPINE WITHOUT CONTRAST TECHNIQUE: Multidetector CT imaging of the head, cervical spine, and maxillofacial structures were performed using the standard protocol without intravenous contrast. Multiplanar CT image reconstructions of the cervical spine and maxillofacial structures were also generated. RADIATION DOSE REDUCTION: This exam was performed according to the departmental dose-optimization program which includes automated exposure control, adjustment of the mA and/or kV according to patient size and/or use of  iterative reconstruction technique. COMPARISON:  Head CT dated 07/07/2021. FINDINGS: CT HEAD FINDINGS Brain: Mild age-related atrophy and chronic microvascular ischemic changes. There is no acute intracranial hemorrhage. No mass effect or midline shift. No extra-axial fluid collection. Vascular: No hyperdense vessel or unexpected calcification. Skull: Normal. Negative for fracture or focal lesion. Other: Small right frontal scalp contusion. CT  MAXILLOFACIAL FINDINGS Osseous: There is an age indeterminate fracture of the nasal bone with mild deviation of the nose to the right. Clinical correlation is recommended. No other acute fracture. No mandibular subluxation. Orbits: Negative. No traumatic or inflammatory finding. Sinuses: Clear. Soft tissues: Negative. CT CERVICAL SPINE FINDINGS Alignment: No acute subluxation. Skull base and vertebrae: No acute fracture. Soft tissues and spinal canal: No prevertebral fluid or swelling. No visible canal hematoma. Disc levels:  Multilevel degenerative changes. Upper chest: Partially visualized right-sided central venous line. Other: Bilateral carotid bulb calcified plaques. IMPRESSION: 1. No acute intracranial pathology. Mild age-related atrophy and chronic microvascular ischemic changes. 2. No acute/traumatic cervical spine pathology. 3. Age indeterminate fracture of the nasal bone with mild deviation of the nose to the right. Electronically Signed   By: Anner Crete M.D.   On: 07/22/2021 21:50       PROCEDURES:  Critical Care performed: No  Procedures    IMPRESSION / MDM / ASSESSMENT AND PLAN / ED COURSE  I reviewed the triage vital signs and the nursing notes.   71 y.o. female with a history of metastatic lung cancer, dementia, cirrhosis, hypertension, hyperlipidemia who presents for evaluation after mechanical fall.  Patient has a large bruise to the right middle back with an overlying abrasion, small skin tear on the left forearm and bruising to the  face.  She is otherwise at baseline per family member who is at bedside.  She is hemodynamically stable but was found to be hypoxic on arrival to the emergency department which is new for her.  Ddx: Traumatic chest injury including rib fracture, pneumothorax versus PE versus pneumonia  Plan: EKG, troponin, CBC, CMP, chest x-ray, lumbar spine, CT head, face and cervical spine.  We will give her Tylenol for pain.  If x-ray does not show any acute abnormality we will pursue a CT angio of the chest to rule out PE or occult fracture.  Patient reports minimal pain at this time.   MEDICATIONS GIVEN IN ED: Medications  acetaminophen (TYLENOL) tablet 1,000 mg (1,000 mg Oral Given 07/23/21 0124)  iohexol (OMNIPAQUE) 350 MG/ML injection 80 mL (80 mLs Intravenous Contrast Given 07/23/21 0141)     ED COURSE: EKG and 2 high-sensitivity troponins were negative.  COVID was negative.  Initial chest x-ray does not show any traumatic injury therefore because patient was hypoxic she was sent for a CT of the chest.  A CT angio was done due to history of cancer to rule out traumatic injury and or PE.  CT of the abdomen pelvis, thoracic and lumbar spine was also added due to patient's large bruising on the back and the fact that patient has been bony metastases to rule out any concerning fractures.  All images were reviewed with no other acute traumatic injuries.  After giving Tylenol for pain patient was able to take nice deep breaths and was no longer hypoxic.  We ambulated her and her sats remained in the upper 90s with no oxygen requirement.  The abrasions were dressed and we discussed pain control and how to avoid falls at home.  Patient stable for discharge to the care of her husband with close follow-up with her primary care doctor.   Consults: None   EMR reviewed including records from her last visit with her oncologist for lung cancer from June 2020    FINAL CLINICAL IMPRESSION(S) / ED DIAGNOSES   Final  diagnoses:  Fall, initial encounter  Traumatic hematoma of lower back, initial encounter  Rx / DC Orders   ED Discharge Orders     None        Note:  This document was prepared using Dragon voice recognition software and may include unintentional dictation errors.   Please note:  Patient was evaluated in Emergency Department today for the symptoms described in the history of present illness. Patient was evaluated in the context of the global COVID-19 pandemic, which necessitated consideration that the patient might be at risk for infection with the SARS-CoV-2 virus that causes COVID-19. Institutional protocols and algorithms that pertain to the evaluation of patients at risk for COVID-19 are in a state of rapid change based on information released by regulatory bodies including the CDC and federal and state organizations. These policies and algorithms were followed during the patient's care in the ED.  Some ED evaluations and interventions may be delayed as a result of limited staffing during the pandemic.       Alfred Levins, [name], MD 07/23/21 [phone]

## 2021-07-23 NOTE — Discharge Instructions (Addendum)
You were seen in the emergency department after a fall. Luckily all of your imaging studies did not show any evidence of injuries. Follow-up with you doctor within the next 2-3 days for further evaluation. Sometimes injuries can present at a later time and therefore it is imperative that you return to the emergency room if you have a severe headache, facial droop, neck pain, numbness or weakness of your extremities, slurred speech, difficulty finding words, chest pain, back pain, abdominal pain, or any other new symptoms that were not present during this visit. You may take Tylenol 1000mg  3 times a day at home for your pain.

## 2021-07-26 ENCOUNTER — Encounter: Payer: Self-pay | Admitting: Family Medicine

## 2021-07-26 ENCOUNTER — Ambulatory Visit (INDEPENDENT_AMBULATORY_CARE_PROVIDER_SITE_OTHER): Payer: PPO | Admitting: Family Medicine

## 2021-07-26 VITALS — BP 130/70 | HR 84 | Temp 97.9°F | Ht 65.0 in | Wt 175.4 lb

## 2021-07-26 DIAGNOSIS — R296 Repeated falls: Secondary | ICD-10-CM | POA: Insufficient documentation

## 2021-07-26 DIAGNOSIS — R4189 Other symptoms and signs involving cognitive functions and awareness: Secondary | ICD-10-CM | POA: Diagnosis not present

## 2021-07-26 DIAGNOSIS — W19XXXA Unspecified fall, initial encounter: Secondary | ICD-10-CM | POA: Diagnosis not present

## 2021-07-26 DIAGNOSIS — S022XXA Fracture of nasal bones, initial encounter for closed fracture: Secondary | ICD-10-CM | POA: Insufficient documentation

## 2021-07-26 NOTE — Assessment & Plan Note (Signed)
The patient's husband reports she continues to have issues with this.  As noted before this is likely multifactorial.  She needs to continue to follow with neurology.

## 2021-07-26 NOTE — Progress Notes (Signed)
Tommi Rumps, MD Phone: [phone]  Twylah Bennetts Booz is a 71 y.o. female who presents today for same day visit.   Follow-up: Patient presents with her husband today.  They both report that she had a fall while walking in socks on hardwood floor at her mom's house.  She fell onto her back.  She was subsequently evaluated in the emergency department and had an extensive evaluation.  She is found to be hypoxic in the waiting room.  She had a CT head, cervical spine, thoracic spine, and lumbar spine without any acute changes.  She had CT abdomen and pelvis without any acute changes.  She had a CT angiogram chest that did not have a PE though did reveal a contusion of the subcutaneous soft tissues of the posterior lower chest wall with a 2 x 9 cm subcutaneous hematoma.  She notes some continued pain.  She has been taking some ibuprofen intermittently for this.  She notes the day prior to this she tripped coming into the house and broke her glasses.  She had bruising over her nose and face from this.  She had a CT maxillofacial in the emergency department that revealed an age-indeterminate fracture of the nasal bone with mild deviation of the nose to the right.  Social History   Tobacco Use  Smoking Status Former   Packs/day: 2.00   Years: 50.00   Total pack years: 100.00   Types: Cigarettes, E-cigarettes   Quit date: 02/07/2012   Years since quitting: 9.4  Smokeless Tobacco Never    Current Outpatient Medications on File Prior to Visit  Medication Sig Dispense Refill   amLODipine (NORVASC) 5 MG tablet Take 1 tablet (5 mg total) by mouth daily. 90 tablet 3   buPROPion (WELLBUTRIN XL) 300 MG 24 hr tablet Take 1 tablet (300 mg total) by mouth daily. 90 tablet 1   clobetasol cream (TEMOVATE) 5.05 % Apply 1 application topically 2 (two) times daily. Lower back 60 g 1   erythromycin ophthalmic ointment Place 1 application  into the right eye daily as needed.     fluticasone (FLONASE) 50 MCG/ACT  nasal spray Place 2 sprays into both nostrils daily. 16 g 6   folic acid (FOLVITE) 1 MG tablet Take 1 tablet by mouth once daily 90 tablet 0   ibuprofen (ADVIL,MOTRIN) 200 MG tablet Take 400 mg by mouth every 8 (eight) hours as needed for headache or moderate pain.     levothyroxine (SYNTHROID) 100 MCG tablet Take 1 tablet (100 mcg total) by mouth daily before breakfast. 30 tablet 1   lidocaine-prilocaine (EMLA) cream Apply to affected area once 30 g 3   loperamide (IMODIUM) 2 MG capsule Take 1 capsule (2 mg total) by mouth every 2 (two) hours as needed for diarrhea or loose stools. Do not exceed 16mg  daily. 30 capsule 1   Multiple Vitamins-Minerals (PRESERVISION AREDS 2) CAPS Take 1 capsule by mouth 2 (two) times a day.      ondansetron (ZOFRAN ODT) 4 MG disintegrating tablet Take 1 tablet (4 mg total) by mouth every 8 (eight) hours as needed for nausea or vomiting. 20 tablet 0   pantoprazole (PROTONIX) 40 MG tablet TAKE 1 TABLET BY MOUTH TWICE DAILY BEFORE A MEAL 180 tablet 0   polyethylene glycol (MIRALAX / GLYCOLAX) 17 g packet Take 17 g by mouth daily as needed for mild constipation. 14 each 0   potassium chloride SA (KLOR-CON M) 20 MEQ tablet Take 1 tablet (20 mEq total)  by mouth daily. 14 tablet 0   rivastigmine (EXELON) 1.5 MG capsule Take 1 capsule by mouth twice daily 60 capsule 0   rosuvastatin (CRESTOR) 20 MG tablet Take 1 tablet by mouth once daily 90 tablet 0   sertraline (ZOLOFT) 100 MG tablet Take 1 tablet (100 mg total) by mouth daily. 90 tablet 1   Current Facility-Administered Medications on File Prior to Visit  Medication Dose Route Frequency Provider Last Rate Last Admin   denosumab (XGEVA) injection 120 mg  120 mg Subcutaneous Q30 days Sindy Guadeloupe, MD   120 mg at 03/05/19 1525     ROS see history of present illness  Objective  Physical Exam Vitals:   07/26/21 1440  BP: 130/70  Pulse: 84  Temp: 97.9 F (36.6 C)  SpO2: 94%    BP Readings from Last 3  Encounters:  07/26/21 130/70  07/23/21 (!) 124/91  07/19/21 (!) 137/93   Wt Readings from Last 3 Encounters:  07/26/21 175 lb 6.4 oz (79.6 kg)  07/22/21 172 lb 11.2 oz (78.3 kg)  07/19/21 172 lb 11.2 oz (78.3 kg)    Physical Exam Constitutional:      General: She is not in acute distress.    Appearance: She is not diaphoretic.  HENT:     Head:     Comments: Some bruising under both eyes and on her nose, slight tenderness over the bridge of her nose, no tenderness of her orbits bilaterally Cardiovascular:     Rate and Rhythm: Normal rate and regular rhythm.     Heart sounds: Normal heart sounds.  Pulmonary:     Effort: Pulmonary effort is normal.     Breath sounds: Normal breath sounds.  Skin:    General: Skin is warm and dry.          Comments: Large bruise over her low and mid back, hematoma as outlined in the picture  Neurological:     Mental Status: She is alert.      Assessment/Plan: Please see individual problem list.  Problem List Items Addressed This Visit     Cognitive impairment (Chronic)    The patient's husband reports she continues to have issues with this.  As noted before this is likely multifactorial.  She needs to continue to follow with neurology.      Closed fracture of nasal bones - Primary    Related to recent fall.  We will refer to ENT though the patient may not require any surgical intervention.      Relevant Orders   Ambulatory referral to ENT   Fall    2 recent falls that have resulted in injury.  Discussed falls precautions including wearing well fitting shoes.  Discussed removal of rugs from the home.  We will have her complete physical therapy to help with balance and leg strengthening to help reduce risk of falling in the future.  Discussed that the hematoma and bruising will take quite some time to resolve.  Discussed cleansing the area with soap and water on a daily basis.  Discussed that he could use antibiotic ointment  over-the-counter on the area of the abrasion.  Discussed if they develop drainage, increasing size of the hematoma, spreading redness, fevers, or any new symptoms that she should seek medical attention.      Relevant Orders   Ambulatory referral to [facility]     Return for As scheduled.   Tommi Rumps, MD [place]

## 2021-07-26 NOTE — Patient Instructions (Signed)
Nice to see you. Home health will contact you to start PT. I did place a referral to ENT given the nose fracture.  They will contact you to set up an appointment. It may take quite some time for the bruising and hematoma to resolve.  If you develop any enlargement of the hematoma, increasing pain, spreading redness, drainage, or any new symptoms please seek medical attention immediately.

## 2021-07-26 NOTE — Assessment & Plan Note (Signed)
2 recent falls that have resulted in injury.  Discussed falls precautions including wearing well fitting shoes.  Discussed removal of rugs from the home.  We will have her complete physical therapy to help with balance and leg strengthening to help reduce risk of falling in the future.  Discussed that the hematoma and bruising will take quite some time to resolve.  Discussed cleansing the area with soap and water on a daily basis.  Discussed that he could use antibiotic ointment over-the-counter on the area of the abrasion.  Discussed if they develop drainage, increasing size of the hematoma, spreading redness, fevers, or any new symptoms that she should seek medical attention.

## 2021-07-26 NOTE — Assessment & Plan Note (Signed)
Related to recent fall.  We will refer to ENT though the patient may not require any surgical intervention.

## 2021-07-29 ENCOUNTER — Other Ambulatory Visit: Payer: Self-pay | Admitting: Oncology

## 2021-07-31 ENCOUNTER — Encounter: Payer: Self-pay | Admitting: Oncology

## 2021-08-02 DIAGNOSIS — Z9181 History of falling: Secondary | ICD-10-CM | POA: Diagnosis not present

## 2021-08-02 DIAGNOSIS — S022XXD Fracture of nasal bones, subsequent encounter for fracture with routine healing: Secondary | ICD-10-CM | POA: Diagnosis not present

## 2021-08-02 DIAGNOSIS — K219 Gastro-esophageal reflux disease without esophagitis: Secondary | ICD-10-CM | POA: Diagnosis not present

## 2021-08-02 DIAGNOSIS — F32A Depression, unspecified: Secondary | ICD-10-CM | POA: Diagnosis not present

## 2021-08-02 DIAGNOSIS — E785 Hyperlipidemia, unspecified: Secondary | ICD-10-CM | POA: Diagnosis not present

## 2021-08-02 DIAGNOSIS — S81812D Laceration without foreign body, left lower leg, subsequent encounter: Secondary | ICD-10-CM | POA: Diagnosis not present

## 2021-08-02 DIAGNOSIS — F419 Anxiety disorder, unspecified: Secondary | ICD-10-CM | POA: Diagnosis not present

## 2021-08-02 DIAGNOSIS — E039 Hypothyroidism, unspecified: Secondary | ICD-10-CM | POA: Diagnosis not present

## 2021-08-02 DIAGNOSIS — S51811D Laceration without foreign body of right forearm, subsequent encounter: Secondary | ICD-10-CM | POA: Diagnosis not present

## 2021-08-02 DIAGNOSIS — G3184 Mild cognitive impairment, so stated: Secondary | ICD-10-CM | POA: Diagnosis not present

## 2021-08-02 DIAGNOSIS — F17210 Nicotine dependence, cigarettes, uncomplicated: Secondary | ICD-10-CM | POA: Diagnosis not present

## 2021-08-02 DIAGNOSIS — I10 Essential (primary) hypertension: Secondary | ICD-10-CM | POA: Diagnosis not present

## 2021-08-02 DIAGNOSIS — H9190 Unspecified hearing loss, unspecified ear: Secondary | ICD-10-CM | POA: Diagnosis not present

## 2021-08-04 ENCOUNTER — Telehealth: Payer: Self-pay | Admitting: Nurse Practitioner

## 2021-08-04 NOTE — Telephone Encounter (Signed)
Spoke with her about the Palliative referral and patient stated that she was in the process of moving right now and requested I email her our contact information and once she gets moved and settled in she would call me back.  Asked patient where she was moving to and she said [place], she said they were moving closer to [place].   I told patient that I would send her the information and that if I hadn't heard from her in a couple of weeks that I would call her back and she was in agreement with this

## 2021-08-07 ENCOUNTER — Encounter: Payer: Self-pay | Admitting: Family Medicine

## 2021-08-07 ENCOUNTER — Ambulatory Visit (INDEPENDENT_AMBULATORY_CARE_PROVIDER_SITE_OTHER): Payer: PPO | Admitting: Family Medicine

## 2021-08-07 DIAGNOSIS — E032 Hypothyroidism due to medicaments and other exogenous substances: Secondary | ICD-10-CM

## 2021-08-07 DIAGNOSIS — S022XXA Fracture of nasal bones, initial encounter for closed fracture: Secondary | ICD-10-CM | POA: Diagnosis not present

## 2021-08-07 DIAGNOSIS — T148XXA Other injury of unspecified body region, initial encounter: Secondary | ICD-10-CM

## 2021-08-07 DIAGNOSIS — W19XXXA Unspecified fall, initial encounter: Secondary | ICD-10-CM | POA: Diagnosis not present

## 2021-08-07 DIAGNOSIS — R4189 Other symptoms and signs involving cognitive functions and awareness: Secondary | ICD-10-CM | POA: Diagnosis not present

## 2021-08-07 DIAGNOSIS — F331 Major depressive disorder, recurrent, moderate: Secondary | ICD-10-CM | POA: Diagnosis not present

## 2021-08-07 LAB — BASIC METABOLIC PANEL
BUN: 15 mg/dL (ref 6–23)
CO2: 29 mEq/L (ref 19–32)
Calcium: 9.3 mg/dL (ref 8.4–10.5)
Chloride: 105 mEq/L (ref 96–112)
Creatinine, Ser: 0.93 mg/dL (ref 0.40–1.20)
GFR: 61.02 mL/min (ref >60.00)
Glucose, Bld: 100 mg/dL — ABNORMAL HIGH (ref 70–99)
Potassium: 4.1 mEq/L (ref 3.5–5.1)
Sodium: 142 mEq/L (ref 135–145)

## 2021-08-07 LAB — CBC
HCT: 33.6 % — ABNORMAL LOW (ref 36.0–46.0)
Hemoglobin: 10.6 g/dL — ABNORMAL LOW (ref 12.0–15.0)
MCHC: 31.6 g/dL (ref 30.0–36.0)
MCV: 97.4 fl (ref 78.0–100.0)
Platelets: 213 10*3/uL (ref 150.0–400.0)
RBC: 3.45 Mil/uL — ABNORMAL LOW (ref 3.87–5.11)
RDW: 19.1 % — ABNORMAL HIGH (ref 11.5–15.5)
WBC: 4.8 10*3/uL (ref 4.0–10.5)

## 2021-08-07 LAB — TSH: TSH: 34.71 u[IU]/mL — ABNORMAL HIGH (ref 0.35–5.50)

## 2021-08-07 MED ORDER — SERTRALINE HCL 100 MG PO TABS: 150.0000 mg | ORAL_TABLET | Freq: Every day | ORAL | 1 refills | Status: DC

## 2021-08-07 NOTE — Assessment & Plan Note (Signed)
Discussed that her thyroid issues or depression could be playing a role as well as underlying dementia.  We will get her scheduled for follow-up with neurology.

## 2021-08-07 NOTE — Assessment & Plan Note (Signed)
Patient has had 1 additional fall.  Discussed that she needs to have PT to help with falls preventions.  I will place another home health order.

## 2021-08-07 NOTE — Patient Instructions (Addendum)
Nice to see you. Home health should contact you to set up physical therapy. We will get lab work today and contact you with results. We will check into the ear nose and throat referral. We will try to get you scheduled with neurology for follow-up. I am increasing your Zoloft to 150 mg daily.  If you have any worsening depression or you have active thoughts of harming yourself please seek medical attention.

## 2021-08-07 NOTE — Assessment & Plan Note (Signed)
Continues to have issues with depression.  We will increase her Zoloft to 150 mg daily.  She will continue Wellbutrin 300 mg daily.  She will monitor for any worsening symptoms.  If she develops active suicidal ideations she will seek medical attention immediately.

## 2021-08-07 NOTE — Assessment & Plan Note (Signed)
I will have somebody contact ENT regarding the patient's prior referral.

## 2021-08-07 NOTE — Progress Notes (Signed)
Anne Rumps, MD Phone: [phone]  Anne [name] is a 71 y.o. female who presents today for follow-up.  Falls: Patient has had 1 additional falls since her last visit.  She scraped up her right forearm.  Notes no pain at this time.  She has full range of motion in her right wrist.  She does still have a hematoma on her back.  The bruising is improving.  She is taking Tylenol for pain.  She has not heard from home health physical therapy.  Nasal fracture: It is unclear if they have seen ENT.  Notes no nose pain.  Hypothyroidism: She is taking her Synthroid daily.  No heat or cold intolerance.  Memory difficulty: Continues to have memory issues that seem to be getting worse.  Her husband notes she is convinced that her mom and dad are alive and at the beach and that they are going to be kicking her out of her house.  Prevagen has not been helpful.  She does note some depression.  She at times wishes she was dead though has no active suicidal thoughts.  She has not seen neurology since her initial evaluation.  She takes Wellbutrin and Zoloft for depression.  Social History   Tobacco Use  Smoking Status Former   Packs/day: 2.00   Years: 50.00   Total pack years: 100.00   Types: Cigarettes, E-cigarettes   Quit date: 02/07/2012   Years since quitting: 9.5  Smokeless Tobacco Never    Current Outpatient Medications on File Prior to Visit  Medication Sig Dispense Refill   amLODipine (NORVASC) 5 MG tablet Take 1 tablet (5 mg total) by mouth daily. 90 tablet 3   buPROPion (WELLBUTRIN XL) 300 MG 24 hr tablet Take 1 tablet (300 mg total) by mouth daily. 90 tablet 1   clobetasol cream (TEMOVATE) 9.98 % Apply 1 application topically 2 (two) times daily. Lower back 60 g 1   erythromycin ophthalmic ointment Place 1 application  into the right eye daily as needed.     fluticasone (FLONASE) 50 MCG/ACT nasal spray Place 2 sprays into both nostrils daily. 16 g 6   folic acid (FOLVITE) 1 MG tablet  Take 1 tablet by mouth once daily 90 tablet 0   ibuprofen (ADVIL,MOTRIN) 200 MG tablet Take 400 mg by mouth every 8 (eight) hours as needed for headache or moderate pain.     levothyroxine (SYNTHROID) 100 MCG tablet Take 1 tablet (100 mcg total) by mouth daily before breakfast. 30 tablet 1   lidocaine-prilocaine (EMLA) cream Apply to affected area once 30 g 3   loperamide (IMODIUM) 2 MG capsule Take 1 capsule (2 mg total) by mouth every 2 (two) hours as needed for diarrhea or loose stools. Do not exceed 16mg  daily. 30 capsule 1   Multiple Vitamins-Minerals (PRESERVISION AREDS 2) CAPS Take 1 capsule by mouth 2 (two) times a day.      ondansetron (ZOFRAN ODT) 4 MG disintegrating tablet Take 1 tablet (4 mg total) by mouth every 8 (eight) hours as needed for nausea or vomiting. 20 tablet 0   pantoprazole (PROTONIX) 40 MG tablet TAKE 1 TABLET BY MOUTH TWICE DAILY BEFORE A MEAL 180 tablet 0   polyethylene glycol (MIRALAX / GLYCOLAX) 17 g packet Take 17 g by mouth daily as needed for mild constipation. 14 each 0   potassium chloride SA (KLOR-CON M) 20 MEQ tablet Take 1 tablet by mouth once daily 14 tablet 0   rivastigmine (EXELON) 1.5 MG capsule Take  1 capsule by mouth twice daily 60 capsule 0   rosuvastatin (CRESTOR) 20 MG tablet Take 1 tablet by mouth once daily 90 tablet 0   Current Facility-Administered Medications on File Prior to Visit  Medication Dose Route Frequency Provider Last Rate Last Admin   denosumab (XGEVA) injection 120 mg  120 mg Subcutaneous Q30 days Sindy Guadeloupe, MD   120 mg at 03/05/19 1525     ROS see history of present illness  Objective  Physical Exam Vitals:   08/07/21 0839  BP: 138/70  Temp: 97.7 F (36.5 C)  SpO2: 94%    BP Readings from Last 3 Encounters:  08/07/21 138/70  07/26/21 130/70  07/23/21 (!) 124/91   Wt Readings from Last 3 Encounters:  08/07/21 171 lb (77.6 kg)  07/26/21 175 lb 6.4 oz (79.6 kg)  07/22/21 172 lb 11.2 oz (78.3 kg)     Physical Exam Constitutional:      General: She is not in acute distress.    Appearance: She is not diaphoretic.  Cardiovascular:     Rate and Rhythm: Normal rate and regular rhythm.     Heart sounds: Normal heart sounds.  Pulmonary:     Effort: Pulmonary effort is normal.     Breath sounds: Normal breath sounds.  Musculoskeletal:       Back:     Comments: Scrapes down the ulnar aspect of her right forearm that appear to be well-healing with no signs of infection, minimal tenderness or defects over the actual scrapes though no bony tenderness in her elbow, forearm, or hand or wrist.  Skin:    General: Skin is warm and dry.  Neurological:     Mental Status: She is alert.      Assessment/Plan: Please see individual problem list.  Problem List Items Addressed This Visit     Cognitive impairment (Chronic)    Discussed that her thyroid issues or depression could be playing a role as well as underlying dementia.  We will get her scheduled for follow-up with neurology.      Depression (Chronic)    Continues to have issues with depression.  We will increase her Zoloft to 150 mg daily.  She will continue Wellbutrin 300 mg daily.  She will monitor for any worsening symptoms.  If she develops active suicidal ideations she will seek medical attention immediately.      Relevant Medications   sertraline (ZOLOFT) 100 MG tablet   Other Relevant Orders   Basic Metabolic Panel (BMET)   Hypothyroidism (Chronic)    Check TSH.  She will continue levothyroxine 100 mcg once daily.      Relevant Orders   TSH   Closed fracture of nasal bones    I will have somebody contact ENT regarding the patient's prior referral.      Fall    Patient has had 1 additional fall.  Discussed that she needs to have PT to help with falls preventions.  I will place another home health order.      Relevant Orders   Ambulatory referral to Home Health   Hematoma    Present on her back.  Discussed this  could take a long time to heal.  If she starts to develop pain or any overlying skin changes they will let us know immediately.      Relevant Orders   CBC    Return in about 6 weeks (around 09/18/2021) for Depression.   Anne Rumps, MD [facility]  [employer]

## 2021-08-07 NOTE — Assessment & Plan Note (Signed)
Check TSH.  She will continue levothyroxine 100 mcg once daily.

## 2021-08-07 NOTE — Assessment & Plan Note (Signed)
Present on her back.  Discussed this could take a long time to heal.  If she starts to develop pain or any overlying skin changes they will let us know immediately.

## 2021-08-10 ENCOUNTER — Ambulatory Visit
Admission: RE | Admit: 2021-08-10 | Discharge: 2021-08-10 | Disposition: A | Discharge status: Home / Self Care | Payer: PPO | Source: Ambulatory Visit | Attending: Oncology | Admitting: Oncology

## 2021-08-10 DIAGNOSIS — C3411 Malignant neoplasm of upper lobe, right bronchus or lung: Secondary | ICD-10-CM | POA: Insufficient documentation

## 2021-08-10 DIAGNOSIS — K760 Fatty (change of) liver, not elsewhere classified: Secondary | ICD-10-CM | POA: Diagnosis not present

## 2021-08-10 DIAGNOSIS — J929 Pleural plaque without asbestos: Secondary | ICD-10-CM | POA: Diagnosis not present

## 2021-08-10 DIAGNOSIS — C3490 Malignant neoplasm of unspecified part of unspecified bronchus or lung: Secondary | ICD-10-CM | POA: Diagnosis not present

## 2021-08-10 DIAGNOSIS — C7951 Secondary malignant neoplasm of bone: Secondary | ICD-10-CM | POA: Diagnosis not present

## 2021-08-10 DIAGNOSIS — J432 Centrilobular emphysema: Secondary | ICD-10-CM | POA: Diagnosis not present

## 2021-08-10 DIAGNOSIS — K7460 Unspecified cirrhosis of liver: Secondary | ICD-10-CM | POA: Diagnosis not present

## 2021-08-10 MED ORDER — IOHEXOL 300 MG/ML  SOLN
100.0000 mL | Freq: Once | INTRAMUSCULAR | Status: AC | PRN
  Administered 2021-08-10: 100 mL via INTRAVENOUS

## 2021-08-10 MED ORDER — TECHNETIUM TC 99M MEDRONATE IV KIT
20.0000 | PACK | Freq: Once | INTRAVENOUS | Status: AC | PRN
  Administered 2021-08-10: 20.56 via INTRAVENOUS

## 2021-08-11 ENCOUNTER — Other Ambulatory Visit: Payer: Self-pay

## 2021-08-11 ENCOUNTER — Telehealth: Payer: Self-pay | Admitting: Family Medicine

## 2021-08-11 DIAGNOSIS — D692 Other nonthrombocytopenic purpura: Secondary | ICD-10-CM | POA: Diagnosis not present

## 2021-08-11 DIAGNOSIS — C7951 Secondary malignant neoplasm of bone: Secondary | ICD-10-CM | POA: Diagnosis not present

## 2021-08-11 DIAGNOSIS — I10 Essential (primary) hypertension: Secondary | ICD-10-CM | POA: Diagnosis not present

## 2021-08-11 DIAGNOSIS — M059 Rheumatoid arthritis with rheumatoid factor, unspecified: Secondary | ICD-10-CM | POA: Diagnosis not present

## 2021-08-11 DIAGNOSIS — Z515 Encounter for palliative care: Secondary | ICD-10-CM | POA: Diagnosis not present

## 2021-08-11 DIAGNOSIS — K766 Portal hypertension: Secondary | ICD-10-CM | POA: Diagnosis not present

## 2021-08-11 DIAGNOSIS — F03911 Unspecified dementia, unspecified severity, with agitation: Secondary | ICD-10-CM | POA: Diagnosis not present

## 2021-08-11 DIAGNOSIS — F331 Major depressive disorder, recurrent, moderate: Secondary | ICD-10-CM

## 2021-08-11 DIAGNOSIS — K7460 Unspecified cirrhosis of liver: Secondary | ICD-10-CM | POA: Diagnosis not present

## 2021-08-11 DIAGNOSIS — C3490 Malignant neoplasm of unspecified part of unspecified bronchus or lung: Secondary | ICD-10-CM | POA: Diagnosis not present

## 2021-08-11 DIAGNOSIS — F330 Major depressive disorder, recurrent, mild: Secondary | ICD-10-CM | POA: Diagnosis not present

## 2021-08-11 MED ORDER — AMLODIPINE BESYLATE 5 MG PO TABS: 5.0000 mg | ORAL_TABLET | Freq: Every day | ORAL | 3 refills | Status: DC

## 2021-08-11 MED ORDER — SERTRALINE HCL 100 MG PO TABS: 150.0000 mg | ORAL_TABLET | Freq: Every day | ORAL | 1 refills | Status: DC

## 2021-08-11 NOTE — Telephone Encounter (Signed)
Kelly from [facility] called stating pt has severe declined; has hid her medicine and phone. They are trying to get social work and other resources involved. Pt need refill on amlodipine and effixor sent [facility] [address]

## 2021-08-11 NOTE — Telephone Encounter (Signed)
The patient is supposed to be on Zoloft.  I sent a refill of that to the pharmacy.  She needs to see her neurologist.  Can we contact them about getting her follow-up scheduled?

## 2021-08-11 NOTE — Telephone Encounter (Signed)
I called to [facility] at [phone] and they are closed.  Will call back on Monday ro schedule a follow up with the patient.  Pennie Vanblarcom,cma

## 2021-08-11 NOTE — Telephone Encounter (Signed)
I sent the medication amlodipine to her pharmacy but I did not see Effexor.  Laikynn Pollio,cma

## 2021-08-14 ENCOUNTER — Telehealth: Payer: Self-pay | Admitting: Neurology

## 2021-08-14 DIAGNOSIS — I10 Essential (primary) hypertension: Secondary | ICD-10-CM | POA: Diagnosis not present

## 2021-08-14 DIAGNOSIS — F419 Anxiety disorder, unspecified: Secondary | ICD-10-CM | POA: Diagnosis not present

## 2021-08-14 DIAGNOSIS — E039 Hypothyroidism, unspecified: Secondary | ICD-10-CM | POA: Diagnosis not present

## 2021-08-14 DIAGNOSIS — Z9181 History of falling: Secondary | ICD-10-CM

## 2021-08-14 DIAGNOSIS — G3184 Mild cognitive impairment, so stated: Secondary | ICD-10-CM | POA: Diagnosis not present

## 2021-08-14 DIAGNOSIS — S81812D Laceration without foreign body, left lower leg, subsequent encounter: Secondary | ICD-10-CM | POA: Diagnosis not present

## 2021-08-14 DIAGNOSIS — S022XXD Fracture of nasal bones, subsequent encounter for fracture with routine healing: Secondary | ICD-10-CM | POA: Diagnosis not present

## 2021-08-14 DIAGNOSIS — K219 Gastro-esophageal reflux disease without esophagitis: Secondary | ICD-10-CM | POA: Diagnosis not present

## 2021-08-14 DIAGNOSIS — F17210 Nicotine dependence, cigarettes, uncomplicated: Secondary | ICD-10-CM | POA: Diagnosis not present

## 2021-08-14 DIAGNOSIS — S51811D Laceration without foreign body of right forearm, subsequent encounter: Secondary | ICD-10-CM | POA: Diagnosis not present

## 2021-08-14 DIAGNOSIS — H9190 Unspecified hearing loss, unspecified ear: Secondary | ICD-10-CM | POA: Diagnosis not present

## 2021-08-14 DIAGNOSIS — F32A Depression, unspecified: Secondary | ICD-10-CM | POA: Diagnosis not present

## 2021-08-14 DIAGNOSIS — E785 Hyperlipidemia, unspecified: Secondary | ICD-10-CM | POA: Diagnosis not present

## 2021-08-14 NOTE — Telephone Encounter (Signed)
I called to [facility] and the patient is scheduled for August 15, 2021 @ 1:45 I called and Informed the patients husband and he asked me to print out the address and he would take her, I printed it out and placed the time of the visit on it and it is up front for him to pickup.  Kellyanne Ellwanger,cma

## 2021-08-14 NOTE — Telephone Encounter (Signed)
Dr. Ellen Henri office [facility]) Memory Loss, pt not taking her medication, pt does know things going on around her.  Scheduled appt with Dr. April Manson 08/15/21

## 2021-08-15 ENCOUNTER — Ambulatory Visit: Payer: PPO | Admitting: Neurology

## 2021-08-15 ENCOUNTER — Encounter: Payer: Self-pay | Admitting: Neurology

## 2021-08-15 ENCOUNTER — Other Ambulatory Visit: Payer: Self-pay | Admitting: Family Medicine

## 2021-08-15 VITALS — BP 130/86 | HR 75 | Ht 67.0 in | Wt 171.0 lb

## 2021-08-15 DIAGNOSIS — E039 Hypothyroidism, unspecified: Secondary | ICD-10-CM

## 2021-08-15 DIAGNOSIS — F331 Major depressive disorder, recurrent, moderate: Secondary | ICD-10-CM

## 2021-08-15 DIAGNOSIS — F02A18 Dementia in other diseases classified elsewhere, mild, with other behavioral disturbance: Secondary | ICD-10-CM

## 2021-08-15 DIAGNOSIS — G301 Alzheimer's disease with late onset: Secondary | ICD-10-CM | POA: Diagnosis not present

## 2021-08-15 MED ORDER — RIVASTIGMINE TARTRATE 1.5 MG PO CAPS: 1.5000 mg | ORAL_CAPSULE | Freq: Two times a day (BID) | ORAL | 6 refills | Status: DC

## 2021-08-15 MED ORDER — QUETIAPINE FUMARATE 25 MG PO TABS: 12.5000 mg | ORAL_TABLET | Freq: Every day | ORAL | 6 refills | Status: DC

## 2021-08-15 NOTE — Progress Notes (Signed)
[name]  PATIENT: Anne [name] DOB: [birth year]  REQUESTING CLINICIAN: Leone Haven, MD HISTORY FROM: Patient and husband  REASON FOR VISIT: Memory decline    HISTORICAL  CHIEF COMPLAINT:  Chief Complaint  Patient presents with   Follow-up    Rm 13. Accompanied by husband. Memory loss, pt not taking her medication. Moca 16/30.    INTERVAL HISTORY 08/15/21 Patient presents today with husband for follow up worsening memory, and additional behavioral changes. She has been non compliant with her medications. Husband has noted that patient has been packing up the car thinking she is about ot move. She has been talking about her mother in [place] when her mother passed away.  Currently she is not  driving, last time she drove to [place] for a weddig when there was no wedding, husband has to call a silver alert on her. At home, she does not cook, use the microwave for frozen dinner. Husband reports that she needs help with showeing and dressing. She misplaces items all the time.  They already have home health services that come once a week and there are complaints of multiple falls.    HISTORY OF PRESENT ILLNESS:  This is a 71 year old woman with past medical history of lung cancer currently getting chemo every 3 to 4 weeks, with bone metastasis, depression (severe during wintertime) hypertension, hypothyroidism, and hyperlipidemia who is presenting with memory decline.  Patient stated that her memory is terrible, she forgets a lot of things.  She has issue with remote memory and also report forgetting recent conversations.  She also misplaces a lot of things.  She currently drives, denies any recent accident, denies being lost in familiar places.  She can handle her financial affairs by herself, denies being late on her bills.  She knows her family members well, do not confuse them with other people.  She has started Prevagen and reports some improvement  with the medication.   Husband stated patient is more forgetful than before, and mainly misplace a lot of things.  He has to repeat himself multiple times because patient will keep on asking the same questions over and over.   She has not been wandering outside her house.  She is able to complete all activities of daily living, she is able to cook, clean, bathe and dress herself.     OTHER MEDICAL CONDITIONS: lung cancer chemo in remission but still undergoing treatment every 3 to 4 weeks, Depression (severe during the winter), HTN, Hypothyroidism, HLD   REVIEW OF SYSTEMS: Full 14 system review of systems performed and negative with exception of: as noted in the HPI   ALLERGIES: Allergies  Allergen Reactions   Bee Venom Swelling    HOME MEDICATIONS: Outpatient Medications Prior to Visit  Medication Sig Dispense Refill   amLODipine (NORVASC) 5 MG tablet Take 1 tablet (5 mg total) by mouth daily. 90 tablet 3   buPROPion (WELLBUTRIN XL) 300 MG 24 hr tablet Take 1 tablet (300 mg total) by mouth daily. 90 tablet 1   clobetasol cream (TEMOVATE) 3.50 % Apply 1 application topically 2 (two) times daily. Lower back 60 g 1   fluticasone (FLONASE) 50 MCG/ACT nasal spray Place 2 sprays into both nostrils daily. 16 g 6   folic acid (FOLVITE) 1 MG tablet Take 1 tablet by mouth once daily 90 tablet 0   ibuprofen (ADVIL,MOTRIN) 200 MG tablet Take 400 mg by mouth every 8 (eight) hours as needed for headache or  moderate pain.     levothyroxine (SYNTHROID) 100 MCG tablet Take 1 tablet (100 mcg total) by mouth daily before breakfast. 30 tablet 1   lidocaine-prilocaine (EMLA) cream Apply to affected area once 30 g 3   Multiple Vitamins-Minerals (PRESERVISION AREDS 2) CAPS Take 1 capsule by mouth 2 (two) times a day.      pantoprazole (PROTONIX) 40 MG tablet TAKE 1 TABLET BY MOUTH TWICE DAILY BEFORE A MEAL 180 tablet 0   potassium chloride SA (KLOR-CON M) 20 MEQ tablet Take 1 tablet by mouth once daily 14  tablet 0   rosuvastatin (CRESTOR) 20 MG tablet Take 1 tablet by mouth once daily 90 tablet 0   sertraline (ZOLOFT) 100 MG tablet Take 1.5 tablets (150 mg total) by mouth daily. (Patient taking differently: Take 100 mg by mouth daily.) 135 tablet 1   rivastigmine (EXELON) 1.5 MG capsule Take 1 capsule by mouth twice daily 60 capsule 0   erythromycin ophthalmic ointment Place 1 application  into the right eye daily as needed.     loperamide (IMODIUM) 2 MG capsule Take 1 capsule (2 mg total) by mouth every 2 (two) hours as needed for diarrhea or loose stools. Do not exceed 16mg  daily. 30 capsule 1   ondansetron (ZOFRAN ODT) 4 MG disintegrating tablet Take 1 tablet (4 mg total) by mouth every 8 (eight) hours as needed for nausea or vomiting. 20 tablet 0   polyethylene glycol (MIRALAX / GLYCOLAX) 17 g packet Take 17 g by mouth daily as needed for mild constipation. 14 each 0   Facility-Administered Medications Prior to Visit  Medication Dose Route Frequency Provider Last Rate Last Admin   denosumab (XGEVA) injection 120 mg  120 mg Subcutaneous Q30 days Sindy Guadeloupe, MD   120 mg at 03/05/19 1525    PAST MEDICAL HISTORY: Past Medical History:  Diagnosis Date   Arthritis    Cirrhosis ([place])    Dementia ([place])    Depression    GERD (gastroesophageal reflux disease)    Hyperlipidemia    Hypertension    Lung cancer ([place])    Metastatic bone cancer     PAST SURGICAL HISTORY: Past Surgical History:  Procedure Laterality Date   [place]   COLONOSCOPY WITH PROPOFOL N/A 11/15/2016   Procedure: COLONOSCOPY WITH PROPOFOL;  Surgeon: Jonathon Bellows, MD;  Location: [facility] ENDOSCOPY;  Service: Gastroenterology;  Laterality: N/A;   ENDOBRONCHIAL ULTRASOUND Right 07/30/2018   Procedure: ENDOBRONCHIAL ULTRASOUND;  Surgeon: Laverle Hobby, MD;  Location: [facility] ORS;  Service: Pulmonary;  Laterality: Right;   ESOPHAGOGASTRODUODENOSCOPY (EGD) WITH PROPOFOL N/A 01/07/2017    Procedure: ESOPHAGOGASTRODUODENOSCOPY (EGD) WITH PROPOFOL;  Surgeon: Jonathon Bellows, MD;  Location: [facility] ENDOSCOPY;  Service: Gastroenterology;  Laterality: N/A;   LAPAROSCOPY N/A 03/01/2017   Procedure: LAPAROSCOPY DIAGNOSTIC;  Surgeon: Robert Bellow, MD;  Location: [facility] ORS;  Service: General;  Laterality: N/A;   PORTA CATH INSERTION N/A 08/14/2018   Procedure: PORTA CATH INSERTION;  Surgeon: Algernon Huxley, MD;  Location: [facility];  Service: Cardiovascular;  Laterality: N/A;   TONSILECTOMY, ADENOIDECTOMY, BILATERAL MYRINGOTOMY AND TUBES  1955   TONSILLECTOMY     VENTRAL HERNIA REPAIR N/A 03/01/2017   10 x 14 CM Ventralight ST mesh, intraperitoneal location.    VENTRAL HERNIA REPAIR N/A 03/01/2017   Procedure: HERNIA REPAIR VENTRAL ADULT;  Surgeon: Robert Bellow, MD;  Location: [facility] ORS;  Service: General;  Laterality: N/A;    FAMILY HISTORY:  Family History  Problem Relation Age of Onset   Hypertension Mother    Ovarian cancer Mother 74   Heart disease Father    Stroke Father    Ovarian cancer Sister        ? dx cancer had hyst.   Breast cancer Neg Hx     SOCIAL HISTORY: Social History   Socioeconomic History   Marital status: Married    Spouse name: Johnny   Number of children: Not on file   Years of education: Not on file   Highest education level: Not on file  Occupational History   Not on file  Tobacco Use   Smoking status: Former    Packs/day: 2.00    Years: 50.00    Total pack years: 100.00    Types: Cigarettes, E-cigarettes    Quit date: 02/07/2012    Years since quitting: 9.5   Smokeless tobacco: Never  Vaping Use   Vaping Use: Some days   Start date: 10/06/2013   Devices: uses no liquid  Substance and Sexual Activity   Alcohol use: No    Alcohol/week: 0.0 standard drinks of alcohol   Drug use: No   Sexual activity: Yes  Other Topics Concern   Not on file  Social History Narrative   Married   Retired   Clinical cytogeneticist level of education     No children    1 cup of coffee   Social Determinants of Health   Financial Resource Strain: Low Risk  (09/08/2020)   Overall Financial Resource Strain (CARDIA)    Difficulty of Paying Living Expenses: Not hard at all  Food Insecurity: No Food Insecurity (09/08/2020)   Hunger Vital Sign    Worried About Running Out of Food in the Last Year: Never true    [place] in the Last Year: Never true  Transportation Needs: No Transportation Needs (09/08/2020)   PRAPARE - Hydrologist (Medical): No    Lack of Transportation (Non-Medical): No  Physical Activity: Unknown (10/29/2018)   Exercise Vital Sign    Days of Exercise per Week: Patient refused    Minutes of Exercise per Session: Patient refused  Stress: No Stress Concern Present (09/08/2020)   [place]    Feeling of Stress : Not at all  Social Connections: Unknown (09/08/2020)   Social Connection and Isolation Panel [NHANES]    Frequency of Communication with Friends and Family: More than three times a week    Frequency of Social Gatherings with Friends and Family: Not on file    Attends Religious Services: Not on file    Active Member of Clubs or Organizations: Not on file    Attends Archivist Meetings: Not on file    Marital Status: Married  Intimate Partner Violence: Not At Risk (09/08/2020)   Humiliation, Afraid, Rape, and Kick questionnaire    Fear of Current or Ex-Partner: No    Emotionally Abused: No    Physically Abused: No    Sexually Abused: No     PHYSICAL EXAM  GENERAL EXAM/CONSTITUTIONAL: Vitals:  Vitals:   08/15/21 1329  BP: 130/86  Pulse: 75  Weight: 171 lb (77.6 kg)  Height: 5\' 7"  (1.702 m)   Body mass index is 26.78 kg/m. Wt Readings from Last 3 Encounters:  08/15/21 171 lb (77.6 kg)  08/07/21 171 lb (77.6 kg)  07/26/21 175 lb 6.4 oz (79.6 kg)   Patient  is in no distress; well developed,  nourished and groomed; neck is supple  CARDIOVASCULAR: Examination of carotid arteries is normal; no carotid bruits Regular rate and rhythm, no murmurs Examination of peripheral vascular system by observation and palpation is normal  EYES: Pupils round and reactive to light, Visual fields full to confrontation, Extraocular movements intacts,   MUSCULOSKELETAL: Gait, strength, tone, movements noted in Neurologic exam below  NEUROLOGIC: MENTAL STATUS:     01/19/2021    8:32 AM 03/14/2020   10:47 AM 06/04/2017    9:34 AM  MMSE - Mini Mental State Exam  Orientation to time 4 4 5   Orientation to Place 5 5 5   Registration 3 3 3   Attention/ Calculation 2 5 5   Recall 0 0 0  Language- name 2 objects 2 2 2   Language- repeat 1 1 1   Language- follow 3 step command 3 2 3   Language- read & follow direction 1 1 1   Write a sentence 1 1 1   Copy design 1 1 1   Total score 23 25 27       08/15/2021    1:32 PM  Montreal Cognitive Assessment   Visuospatial/ Executive (0/5) 2  Naming (0/3) 2  Attention: Read list of digits (0/2) 2  Attention: Read list of letters (0/1) 1  Attention: Serial 7 subtraction starting at 100 (0/3) 3  Language: Repeat phrase (0/2) 2  Language : Fluency (0/1) 0  Abstraction (0/2) 1  Delayed Recall (0/5) 0  Orientation (0/6) 3  Total 16     CRANIAL NERVE:  2nd, 3rd, 4th, 6th - pupils equal and reactive to light, visual fields full to confrontation, extraocular muscles intact, no nystagmus 5th - facial sensation symmetric 7th - facial strength symmetric 8th - hearing intact 9th - palate elevates symmetrically, uvula midline 11th - shoulder shrug symmetric 12th - tongue protrusion midline  MOTOR:  normal bulk and tone, full strength in the BUE, BLE  SENSORY:  normal and symmetric to light touch  COORDINATION:  finger-nose-finger, fine finger movements normal  REFLEXES:  deep tendon reflexes present and symmetric  GAIT/STATION:   normal   DIAGNOSTIC DATA (LABS, IMAGING, TESTING) - I reviewed patient records, labs, notes, testing and imaging myself where available.  Lab Results  Component Value Date   WBC 4.8 08/07/2021   HGB 10.6 (L) 08/07/2021   HCT 33.6 (L) 08/07/2021   MCV 97.4 08/07/2021   PLT 213.0 08/07/2021      Component Value Date/Time   NA 142 08/07/2021 0903   NA 142 07/17/2021 1347   K 4.1 08/07/2021 0903   CL 105 08/07/2021 0903   CO2 29 08/07/2021 0903   GLUCOSE 100 (H) 08/07/2021 0903   BUN 15 08/07/2021 0903   BUN 12 07/17/2021 1347   CREATININE 0.93 08/07/2021 0903   CREATININE 1.09 02/26/2012 0925   CALCIUM 9.3 08/07/2021 0903   PROT 7.9 07/22/2021 2056   PROT 7.1 07/17/2021 1347   ALBUMIN 4.0 07/22/2021 2056   ALBUMIN 4.5 07/17/2021 1347   AST 68 (H) 07/22/2021 2056   ALT 61 (H) 07/22/2021 2056   ALKPHOS 112 07/22/2021 2056   BILITOT 0.7 07/22/2021 2056   BILITOT 0.4 07/17/2021 1347   GFRNONAA 47 (L) 07/22/2021 2056   GFRNONAA 54 (L) 02/26/2012 0925   GFRAA >60 11/05/2019 2025   GFRAA >60 02/26/2012 0925   Lab Results  Component Value Date   CHOL 177 08/24/2020   HDL 54.90 08/24/2020   LDLCALC 82 08/24/2020  LDLDIRECT 95.0 07/22/2019   TRIG 198.0 (H) 08/24/2020   CHOLHDL 3 08/24/2020   Lab Results  Component Value Date   HGBA1C 6.1 09/12/2016   Lab Results  Component Value Date   VITAMINB12 1,235 (H) 10/01/2018   Lab Results  Component Value Date   TSH 34.71 (H) 08/07/2021    MRI Brain 08/2020 1. No evidence of intracranial metastasis. 2. Evidence of right mandibular head metastasis which is new from 2020 brain MRI and possibly new from a February 2022 bone scan.    ASSESSMENT AND PLAN  71 y.o. year old female with lung cancer currently getting chemo every 3 to 4 weeks, with bone metastasis, depression (severe during wintertime) hypertension, hypothyroidism, and hyperlipidemia who is presenting for follow up with memory decline described as being  forgetful, has to repeat herself multiple times and asked the same questions and now with behavioral changes. On exam today, she has worsening Moca score to 16/30. Discussed with patient importance of medication adherence, her last TSH was abnormal at 34.71 may be contributing to her changes in mental status. I will also start her on low dose Seroquel to help with her sleep and abnormal behavior. Follow up in 6 months     1. 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 Hypothyroidism, unspecified type    Patient Instructions  Start with Exelon 1.5 mg twice daily  Start with Seroquel 12.5 mg (1/2 tablet) nightly  Continue your other medications  Return in 6 months or sooner if worse     No orders of the defined types were placed in this encounter.    Meds ordered this encounter  Medications   QUEtiapine (SEROQUEL) 25 MG tablet    Sig: Take 0.5 tablets (12.5 mg total) by mouth at bedtime.    Dispense:  15 tablet    Refill:  6   rivastigmine (EXELON) 1.5 MG capsule    Sig: Take 1 capsule (1.5 mg total) by mouth 2 (two) times daily.    Dispense:  60 capsule    Refill:  6     Return in about 6 months (around 02/15/2022).  I have spent a total of 45 minutes dedicated to this patient today, preparing to see patient, performing a medically appropriate examination and evaluation, ordering tests and/or medications and procedures, and counseling and educating the patient/family/caregiver; independently interpreting result and communicating results to the family/patient/caregiver; and documenting clinical information in the electronic medical record.   Alric Ran, MD 08/15/2021, 6:01 PM  [name] [address], [place], [place] [address] [phone]

## 2021-08-15 NOTE — Patient Instructions (Signed)
Start with Exelon 1.5 mg twice daily  Start with Seroquel 12.5 mg (1/2 tablet) nightly  Continue your other medications  Return in 6 months or sooner if worse

## 2021-08-16 ENCOUNTER — Inpatient Hospital Stay: Payer: PPO | Attending: Oncology

## 2021-08-16 ENCOUNTER — Other Ambulatory Visit: Payer: Self-pay | Admitting: *Deleted

## 2021-08-16 ENCOUNTER — Encounter: Payer: Self-pay | Admitting: Oncology

## 2021-08-16 ENCOUNTER — Inpatient Hospital Stay (HOSPITAL_BASED_OUTPATIENT_CLINIC_OR_DEPARTMENT_OTHER): Payer: PPO | Admitting: Oncology

## 2021-08-16 ENCOUNTER — Inpatient Hospital Stay: Payer: PPO | Admitting: Occupational Therapy

## 2021-08-16 VITALS — BP 141/75 | HR 69 | Temp 98.1°F | Resp 16 | Ht 67.0 in | Wt 169.7 lb

## 2021-08-16 DIAGNOSIS — E063 Autoimmune thyroiditis: Secondary | ICD-10-CM | POA: Insufficient documentation

## 2021-08-16 DIAGNOSIS — F03918 Unspecified dementia, unspecified severity, with other behavioral disturbance: Secondary | ICD-10-CM | POA: Diagnosis not present

## 2021-08-16 DIAGNOSIS — N179 Acute kidney failure, unspecified: Secondary | ICD-10-CM | POA: Insufficient documentation

## 2021-08-16 DIAGNOSIS — K7460 Unspecified cirrhosis of liver: Secondary | ICD-10-CM | POA: Insufficient documentation

## 2021-08-16 DIAGNOSIS — Z79899 Other long term (current) drug therapy: Secondary | ICD-10-CM | POA: Insufficient documentation

## 2021-08-16 DIAGNOSIS — R945 Abnormal results of liver function studies: Secondary | ICD-10-CM | POA: Insufficient documentation

## 2021-08-16 DIAGNOSIS — Z9181 History of falling: Secondary | ICD-10-CM

## 2021-08-16 DIAGNOSIS — R413 Other amnesia: Secondary | ICD-10-CM | POA: Diagnosis not present

## 2021-08-16 DIAGNOSIS — C3411 Malignant neoplasm of upper lobe, right bronchus or lung: Secondary | ICD-10-CM | POA: Diagnosis not present

## 2021-08-16 DIAGNOSIS — F17290 Nicotine dependence, other tobacco product, uncomplicated: Secondary | ICD-10-CM | POA: Insufficient documentation

## 2021-08-16 DIAGNOSIS — C7951 Secondary malignant neoplasm of bone: Secondary | ICD-10-CM | POA: Diagnosis not present

## 2021-08-16 DIAGNOSIS — Z8041 Family history of malignant neoplasm of ovary: Secondary | ICD-10-CM | POA: Insufficient documentation

## 2021-08-16 DIAGNOSIS — Z79890 Hormone replacement therapy (postmenopausal): Secondary | ICD-10-CM | POA: Diagnosis not present

## 2021-08-16 LAB — COMPREHENSIVE METABOLIC PANEL
ALT: 24 U/L (ref 0–44)
AST: 32 U/L (ref 15–41)
Albumin: 3.9 g/dL (ref 3.5–5.0)
Alkaline Phosphatase: 107 U/L (ref 38–126)
Anion gap: 6 (ref 5–15)
BUN: 16 mg/dL (ref 8–23)
CO2: 29 mmol/L (ref 22–32)
Calcium: 8.6 mg/dL — ABNORMAL LOW (ref 8.9–10.3)
Chloride: 104 mmol/L (ref 98–111)
Creatinine, Ser: 1.03 mg/dL — ABNORMAL HIGH (ref 0.44–1.00)
GFR, Estimated: 57 mL/min — ABNORMAL LOW (ref >60)
Glucose, Bld: 96 mg/dL (ref 70–99)
Potassium: 3.7 mmol/L (ref 3.5–5.1)
Sodium: 139 mmol/L (ref 135–145)
Total Bilirubin: 0.5 mg/dL (ref 0.3–1.2)
Total Protein: 7.2 g/dL (ref 6.5–8.1)

## 2021-08-16 LAB — CBC WITH DIFFERENTIAL/PLATELET
Abs Immature Granulocytes: 0.03 10*3/uL (ref 0.00–0.07)
Basophils Absolute: 0.1 10*3/uL (ref 0.0–0.1)
Basophils Relative: 1 %
Eosinophils Absolute: 0.4 10*3/uL (ref 0.0–0.5)
Eosinophils Relative: 6 %
HCT: 36.7 % (ref 36.0–46.0)
Hemoglobin: 11.4 g/dL — ABNORMAL LOW (ref 12.0–15.0)
Immature Granulocytes: 1 %
Lymphocytes Relative: 21 %
Lymphs Abs: 1.2 10*3/uL (ref 0.7–4.0)
MCH: 30.6 pg (ref 26.0–34.0)
MCHC: 31.1 g/dL (ref 30.0–36.0)
MCV: 98.4 fL (ref 80.0–100.0)
Monocytes Absolute: 0.4 10*3/uL (ref 0.1–1.0)
Monocytes Relative: 8 %
Neutro Abs: 3.6 10*3/uL (ref 1.7–7.7)
Neutrophils Relative %: 63 %
Platelets: 189 10*3/uL (ref 150–400)
RBC: 3.73 MIL/uL — ABNORMAL LOW (ref 3.87–5.11)
RDW: 17.2 % — ABNORMAL HIGH (ref 11.5–15.5)
WBC: 5.7 10*3/uL (ref 4.0–10.5)
nRBC: 0 % (ref 0.0–0.2)

## 2021-08-16 LAB — TSH: TSH: 38.463 u[IU]/mL — ABNORMAL HIGH (ref 0.350–4.500)

## 2021-08-16 LAB — CK: Total CK: 52 U/L (ref 38–234)

## 2021-08-16 MED ORDER — LEVOTHYROXINE SODIUM 125 MCG PO TABS
125.0000 ug | ORAL_TABLET | Freq: Every day | ORAL | 3 refills | Status: DC
Start: 2021-08-16 — End: 2021-11-29

## 2021-08-16 NOTE — Progress Notes (Signed)
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  She has been referred to [place] for findings of bone metastases and her recent MRI.  She has a prior history of 3 packs/day day smoking for over 45 years and quit smoking 5 years ago.She had a CT chest abdomen pelvis in 2018 which showed a 5 mm lung nodule in the left lower lobe.  Recently over the last 2 months patient has been having worsening back pain and was referred to orthopedics.  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  Pathologic fracture of S1.   PET scan showed 2 RUL lung nodules, hilar and mediastinal adenopathy and widespread bone mets. MRI brain negative.   Patient completed palliative RT to her spine. Bronchoscopy showed small cell lung cancer.  Palliative carboplatin, etoposide and Tecentriq started on 08/18/2018. Scans after 4 cycles showed stable disease. 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  She reports being compliant but her values fluctuate  widely.      Interval history- she remains confused intermittently. Was seen by neurology yesterday and started on seroquel  ECOG PS- 1 Pain scale- 0   Review of systems- Review of Systems  Constitutional:  Negative for chills, fever, malaise/fatigue and weight loss.  HENT:  Negative for congestion, ear discharge and nosebleeds.   Eyes:  Negative for blurred vision.  Respiratory:  Negative for cough, hemoptysis, sputum production, shortness of breath and wheezing.   Cardiovascular:  Negative for chest pain, palpitations, orthopnea and claudication.  Gastrointestinal:  Negative for abdominal pain, blood in stool, constipation, diarrhea, heartburn, melena, nausea and vomiting.  Genitourinary:  Negative for dysuria, flank pain, frequency, hematuria and urgency.  Musculoskeletal:  Negative for back pain, joint pain and myalgias.  Skin:  Negative for rash.  Neurological:  Negative for dizziness, tingling, focal weakness, seizures, weakness and headaches.  Endo/Heme/Allergies:  Does not bruise/bleed easily.  Psychiatric/Behavioral:  Negative for depression and suicidal ideas. The patient does not have insomnia.       Allergies  Allergen Reactions   Bee Venom Swelling     Past Medical History:  Diagnosis Date   Arthritis    Cirrhosis ([place])    Dementia ([place])    Depression    GERD (gastroesophageal reflux disease)    Hyperlipidemia    Hypertension    Lung cancer ([place])    Metastatic bone cancer      Past Surgical History:  Procedure Laterality Date   APPENDECTOMY  1971   CHOLECYSTECTOMY  1971   COLONOSCOPY WITH PROPOFOL N/A 11/15/2016   Procedure: COLONOSCOPY WITH PROPOFOL;  Surgeon: Jonathon Bellows, MD;  Location:  [facility] ENDOSCOPY;  Service: Gastroenterology;  Laterality: N/A;   ENDOBRONCHIAL ULTRASOUND Right 07/30/2018   Procedure: ENDOBRONCHIAL ULTRASOUND;  Surgeon: Laverle Hobby, MD;  Location: [facility] ORS;  Service: Pulmonary;  Laterality: Right;    ESOPHAGOGASTRODUODENOSCOPY (EGD) WITH PROPOFOL N/A 01/07/2017   Procedure: ESOPHAGOGASTRODUODENOSCOPY (EGD) WITH PROPOFOL;  Surgeon: Jonathon Bellows, MD;  Location: [facility] ENDOSCOPY;  Service: Gastroenterology;  Laterality: N/A;   LAPAROSCOPY N/A 03/01/2017   Procedure: LAPAROSCOPY DIAGNOSTIC;  Surgeon: Robert Bellow, MD;  Location: [facility] ORS;  Service: General;  Laterality: N/A;   PORTA CATH INSERTION N/A 08/14/2018   Procedure: PORTA CATH INSERTION;  Surgeon: Algernon Huxley, MD;  Location: [place] [facility];  Service: Cardiovascular;  Laterality: N/A;   TONSILECTOMY, ADENOIDECTOMY, BILATERAL MYRINGOTOMY AND TUBES  1955   TONSILLECTOMY     VENTRAL HERNIA REPAIR N/A 03/01/2017   10 x 14 CM Ventralight ST mesh, intraperitoneal location.    VENTRAL HERNIA REPAIR N/A 03/01/2017   Procedure: HERNIA REPAIR VENTRAL ADULT;  Surgeon: Robert Bellow, MD;  Location: [facility] ORS;  Service: General;  Laterality: N/A;    Social History   Socioeconomic History   Marital status: Married    Spouse name: Johnny   Number of children: Not on file   Years of education: Not on file   Highest education level: Not on file  Occupational History   Not on file  Tobacco Use   Smoking status: Former    Packs/day: 2.00    Years: 50.00    Total pack years: 100.00    Types: Cigarettes, E-cigarettes    Quit date: 02/07/2012    Years since quitting: 9.5   Smokeless tobacco: Never  Vaping Use   Vaping Use: Some days   Start date: 10/06/2013   Devices: uses no liquid  Substance and Sexual Activity   Alcohol use: No    Alcohol/week: 0.0 standard drinks of alcohol   Drug use: No   Sexual activity: Yes  Other Topics Concern   Not on file  Social History Narrative   Married   Retired   Clinical cytogeneticist level of education    No children    1 cup of coffee   Social Determinants of Health   Financial Resource Strain: Low Risk  (09/08/2020)   Overall Financial Resource Strain (CARDIA)    Difficulty of Paying  Living Expenses: Not hard at all  Food Insecurity: No Food Insecurity (09/08/2020)   Hunger Vital Sign    Worried About Running Out of Food in the Last Year: Never true    [place] in the Last Year: Never true  Transportation Needs: No Transportation Needs (09/08/2020)   PRAPARE - Hydrologist (Medical): No    Lack of Transportation (Non-Medical): No  Physical Activity: Unknown (10/29/2018)   Exercise Vital Sign    Days of Exercise per Week: Patient refused    Minutes of Exercise per Session: Patient refused  Stress: No Stress Concern Present (09/08/2020)   [place]    Feeling of Stress : Not at all  Social Connections: Unknown (09/08/2020)   Social Connection and Isolation Panel [NHANES]    Frequency of Communication with Friends and Family: More than three times a week    Frequency of Social Gatherings with Friends and Family: Not on file    Attends Religious Services: Not on file    Active Member of Clubs or Organizations: Not on file  Attends Archivist Meetings: Not on file    Marital Status: Married  Intimate Partner Violence: Not At Risk (09/08/2020)   Humiliation, Afraid, Rape, and Kick questionnaire    Fear of Current or Ex-Partner: No    Emotionally Abused: No    Physically Abused: No    Sexually Abused: No    Family History  Problem Relation Age of Onset   Hypertension Mother    Ovarian cancer Mother 93   Heart disease Father    Stroke Father    Ovarian cancer Sister        ? dx cancer had hyst.   Breast cancer Neg Hx      Current Outpatient Medications:    amLODipine (NORVASC) 5 MG tablet, Take 1 tablet (5 mg total) by mouth daily., Disp: 90 tablet, Rfl: 3   clobetasol cream (TEMOVATE) 7.42 %, Apply 1 application topically 2 (two) times daily. Lower back, Disp: 60 g, Rfl: 1   fluticasone (FLONASE) 50 MCG/ACT nasal spray, Place 2 sprays into both nostrils  daily., Disp: 16 g, Rfl: 6   folic acid (FOLVITE) 1 MG tablet, Take 1 tablet by mouth once daily, Disp: 90 tablet, Rfl: 0   ibuprofen (ADVIL,MOTRIN) 200 MG tablet, Take 400 mg by mouth every 8 (eight) hours as needed for headache or moderate pain., Disp: , Rfl:    levothyroxine (SYNTHROID) 100 MCG tablet, Take 1 tablet (100 mcg total) by mouth daily before breakfast., Disp: 30 tablet, Rfl: 1   levothyroxine (SYNTHROID) 125 MCG tablet, Take 1 tablet (125 mcg total) by mouth daily before breakfast., Disp: 30 tablet, Rfl: 3   Multiple Vitamins-Minerals (PRESERVISION AREDS 2) CAPS, Take 1 capsule by mouth 2 (two) times a day. , Disp: , Rfl:    pantoprazole (PROTONIX) 40 MG tablet, TAKE 1 TABLET BY MOUTH TWICE DAILY BEFORE A MEAL, Disp: 180 tablet, Rfl: 0   potassium chloride SA (KLOR-CON M) 20 MEQ tablet, Take 1 tablet by mouth once daily, Disp: 14 tablet, Rfl: 0   QUEtiapine (SEROQUEL) 25 MG tablet, Take 0.5 tablets (12.5 mg total) by mouth at bedtime., Disp: 15 tablet, Rfl: 6   rivastigmine (EXELON) 1.5 MG capsule, Take 1 capsule (1.5 mg total) by mouth 2 (two) times daily., Disp: 60 capsule, Rfl: 6   rosuvastatin (CRESTOR) 20 MG tablet, Take 1 tablet by mouth once daily, Disp: 90 tablet, Rfl: 0   sertraline (ZOLOFT) 100 MG tablet, Take 1.5 tablets (150 mg total) by mouth daily. (Patient taking differently: Take 100 mg by mouth daily.), Disp: 135 tablet, Rfl: 1   buPROPion (WELLBUTRIN XL) 300 MG 24 hr tablet, Take 1 tablet by mouth once daily, Disp: 90 tablet, Rfl: 3   lidocaine-prilocaine (EMLA) cream, Apply to affected area once (Patient not taking: Reported on 08/16/2021), Disp: 30 g, Rfl: 3 No current facility-administered medications for this visit.  Facility-Administered Medications Ordered in Other Visits:    denosumab (XGEVA) injection 120 mg, 120 mg, Subcutaneous, Q30 days, Sindy Guadeloupe, MD, 120 mg at 03/05/19 1525  Physical exam:  Vitals:   08/16/21 0934  BP: (!) 141/75  Pulse: 69   Resp: 16  Temp: 98.1 F (36.7 C)  TempSrc: Oral  Weight: 169 lb 11.2 oz (77 kg)  Height: 5\' 7"  (1.702 m)   Physical Exam Cardiovascular:     Rate and Rhythm: Normal rate and regular rhythm.     Heart sounds: Normal heart sounds.  Pulmonary:     Effort: Pulmonary effort is normal.  Breath sounds: Normal breath sounds.  Abdominal:     General: Bowel sounds are normal.     Palpations: Abdomen is soft.  Musculoskeletal:     Cervical back: Normal range of motion.  Skin:    General: Skin is warm and dry.  Neurological:     Mental Status: She is alert.     Comments: Oriented to self and place         Latest Ref Rng & Units 08/16/2021    9:09 AM  CMP  Glucose 70 - 99 mg/dL 96   BUN 8 - 23 mg/dL 16   Creatinine 0.44 - 1.00 mg/dL 1.03   Sodium 135 - 145 mmol/L 139   Potassium 3.5 - 5.1 mmol/L 3.7   Chloride 98 - 111 mmol/L 104   CO2 22 - 32 mmol/L 29   Calcium 8.9 - 10.3 mg/dL 8.6   Total Protein 6.5 - 8.1 g/dL 7.2   Total Bilirubin 0.3 - 1.2 mg/dL 0.5   Alkaline Phos 38 - 126 U/L 107   AST 15 - 41 U/L 32   ALT 0 - 44 U/L 24       Latest Ref Rng & Units 08/16/2021    9:09 AM  CBC  WBC 4.0 - 10.5 K/uL 5.7   Hemoglobin 12.0 - 15.0 g/dL 11.4   Hematocrit 36.0 - 46.0 % 36.7   Platelets 150 - 400 K/uL 189     No images are attached to the encounter.  NM Bone Scan Whole Body  Result Date: 08/11/2021 CLINICAL DATA:  Small cell lung cancer, bone metastases EXAM: NUCLEAR MEDICINE WHOLE BODY BONE SCAN TECHNIQUE: Whole body anterior and posterior images were obtained approximately 3 hours after intravenous injection of radiopharmaceutical. RADIOPHARMACEUTICALS:  20.56 mCi Technetium-33m MDP IV COMPARISON:  CT chest abdomen and pelvis 08/10/2021, bone scan 03/01/2021 FINDINGS: Anterior and posterior whole body planar images as well as spot images of the calvarium and thoracic cage are obtained. Physiologic excretion of radiotracer within the kidneys and bladder. Degenerative  type activity within the bilateral shoulders, right knee, and bilateral feet. Mild rotatory scoliosis of thoracolumbar spine. There is mild low level radiotracer activity within the thoracolumbar spine at the T4 level, as well as along the posterior elements from T11 through L1. These areas correspond to degenerative type changes on recent CT. I do not see any other abnormal areas of radiotracer uptake to suggest an acute or destructive process. IMPRESSION: 1. Multifocal degenerative changes as above. 2. No abnormal radiotracer activity to suggest bony metastatic disease. No significant change since prior bone scan. Electronically Signed   By: Randa Ngo M.D.   On: 08/11/2021 15:39   CT CHEST ABDOMEN PELVIS W CONTRAST  Result Date: 08/10/2021 CLINICAL DATA:  Metastatic lung cancer restaging, osseous metastatic disease * Tracking Code: BO * EXAM: CT CHEST, ABDOMEN, AND PELVIS WITH CONTRAST TECHNIQUE: Multidetector CT imaging of the chest, abdomen and pelvis was performed following the standard protocol during bolus administration of intravenous contrast. RADIATION DOSE REDUCTION: This exam was performed according to the departmental dose-optimization program which includes automated exposure control, adjustment of the mA and/or kV according to patient size and/or use of iterative reconstruction technique. CONTRAST:  126mL OMNIPAQUE IOHEXOL 300 MG/ML SOLN, additional oral enteric contrast COMPARISON:  CT chest angiogram, 07/23/2021, CT abdomen pelvis, 07/23/2021, CT chest abdomen pelvis, 03/02/2021 FINDINGS: CT CHEST FINDINGS Cardiovascular: Right chest port catheter. Aortic atherosclerosis. Aortic valve calcifications. Normal heart size. Three-vessel coronary artery calcifications. No pericardial effusion.  Mediastinum/Nodes: No enlarged mediastinal, hilar, or axillary lymph nodes. Thyroid gland, trachea, and esophagus demonstrate no significant findings. Lungs/Pleura: Mild centrilobular emphysema. Diffuse  bilateral bronchial wall thickening. Tiny, scattered pulmonary nodules of the right upper lobe are unchanged, some of which appear to be partially calcified, for example a 0.4 cm nodule of the posteromedial right upper lobe (series 5, image 44). No pleural effusion or pneumothorax. Musculoskeletal: No chest wall mass. CT ABDOMEN PELVIS FINDINGS Hepatobiliary: No solid liver abnormality is seen. Hepatic steatosis. Nodular contour of the liver. No gallstones, gallbladder wall thickening, or biliary dilatation. Pancreas: Unremarkable. No pancreatic ductal dilatation or surrounding inflammatory changes. Spleen: Normal in size without significant abnormality. Adrenals/Urinary Tract: Adrenal glands are unremarkable. Kidneys are normal, without renal calculi, solid lesion, or hydronephrosis. Bladder is unremarkable. Stomach/Bowel: Stomach is within normal limits. Appendix is not clearly visualized and may be surgically absent. No evidence of bowel wall thickening, distention, or inflammatory changes. Vascular/Lymphatic: Aortic atherosclerosis. No enlarged abdominal or pelvic lymph nodes. Reproductive: No mass or other abnormality. Other: No abdominal wall hernia or abnormality. No ascites. Musculoskeletal: No acute osseous findings. Multiple subtly sclerotic osseous lesions of the vertebral bodies and wedge and endplate deformities throughout the thoracic spine, lumbar spine, and sacrum are unchanged, including of T2, L1, L2, L3, L4, and S1 (series 7, image 120). IMPRESSION: 1. Tiny, scattered pulmonary nodules of the right upper lobe are unchanged, some of which appear to be partially calcified, most likely benign and incidental. 2. Multiple subtly sclerotic osseous lesions of the vertebral bodies and wedge and endplate deformities throughout the thoracic spine, lumbar spine, and sacrum are unchanged, including of T2, L1, L2, L3, L4, and S1. Findings are consistent with known osseous metastatic disease. 3. No evidence of  lymphadenopathy or soft tissue metastatic disease in the chest, abdomen, or pelvis. 4. Emphysema and diffuse bilateral bronchial wall thickening. 5. Coronary artery disease. 6. Hepatic steatosis and cirrhosis. Aortic Atherosclerosis (ICD10-I70.0) and Emphysema (ICD10-J43.9). Electronically Signed   By: Delanna Ahmadi M.D.   On: 08/10/2021 10:01   CT T-SPINE NO CHARGE  Result Date: 07/23/2021 CLINICAL DATA:  Fall. EXAM: CT THORACIC AND LUMBAR SPINE WITHOUT CONTRAST TECHNIQUE: Multidetector CT imaging of the thoracic and lumbar spine was performed without contrast. Multiplanar CT image reconstructions were also generated. RADIATION DOSE REDUCTION: This exam was performed according to the departmental dose-optimization program which includes automated exposure control, adjustment of the mA and/or kV according to patient size and/or use of iterative reconstruction technique. COMPARISON:  CT of the chest abdomen pelvis dated 07/23/2021 and 03/01/2021. FINDINGS: CT THORACIC SPINE FINDINGS Alignment: No acute subluxation. Vertebrae: No acute fracture. Old T2 compression fracture. Osteopenia. Paraspinal and other soft tissues: No paraspinal fluid collection. Subcutaneous hematoma in the lower posterior chest wall. Disc levels: No acute findings.  Degenerative changes. CT LUMBAR SPINE FINDINGS Segmentation: 5 lumbar type vertebrae. Alignment: No acute subluxation.  Grade 1 L4-L5 anterolisthesis. Vertebrae: No acute fractures. Multilevel chronic compression fractures most prominent at L2. There is complete loss of vertebral body height centrally at this level. Paraspinal and other soft tissues: No paraspinal fluid collection. Disc levels: Multilevel degenerative changes with disc desiccation and vacuum phenomena. IMPRESSION: 1. No acute/traumatic thoracic or lumbar spine pathology. 2. Multilevel degenerative changes and chronic compression fractures. Electronically Signed   By: Anner Crete M.D.   On: 07/23/2021 02:30    CT L-SPINE NO CHARGE  Result Date: 07/23/2021 CLINICAL DATA:  Fall. EXAM: CT THORACIC AND LUMBAR SPINE WITHOUT CONTRAST TECHNIQUE: Multidetector  CT imaging of the thoracic and lumbar spine was performed without contrast. Multiplanar CT image reconstructions were also generated. RADIATION DOSE REDUCTION: This exam was performed according to the departmental dose-optimization program which includes automated exposure control, adjustment of the mA and/or kV according to patient size and/or use of iterative reconstruction technique. COMPARISON:  CT of the chest abdomen pelvis dated 07/23/2021 and 03/01/2021. FINDINGS: CT THORACIC SPINE FINDINGS Alignment: No acute subluxation. Vertebrae: No acute fracture. Old T2 compression fracture. Osteopenia. Paraspinal and other soft tissues: No paraspinal fluid collection. Subcutaneous hematoma in the lower posterior chest wall. Disc levels: No acute findings.  Degenerative changes. CT LUMBAR SPINE FINDINGS Segmentation: 5 lumbar type vertebrae. Alignment: No acute subluxation.  Grade 1 L4-L5 anterolisthesis. Vertebrae: No acute fractures. Multilevel chronic compression fractures most prominent at L2. There is complete loss of vertebral body height centrally at this level. Paraspinal and other soft tissues: No paraspinal fluid collection. Disc levels: Multilevel degenerative changes with disc desiccation and vacuum phenomena. IMPRESSION: 1. No acute/traumatic thoracic or lumbar spine pathology. 2. Multilevel degenerative changes and chronic compression fractures. Electronically Signed   By: Anner Crete M.D.   On: 07/23/2021 02:30   CT Angio Chest PE W and/or Wo Contrast  Result Date: 07/23/2021 CLINICAL DATA:  Trauma. EXAM: CT ANGIOGRAPHY CHEST CT ABDOMEN AND PELVIS WITH CONTRAST TECHNIQUE: Multidetector CT imaging of the chest was performed using the standard protocol during bolus administration of intravenous contrast. Multiplanar CT image reconstructions and MIPs  were obtained to evaluate the vascular anatomy. Multidetector CT imaging of the abdomen and pelvis was performed using the standard protocol during bolus administration of intravenous contrast. RADIATION DOSE REDUCTION: This exam was performed according to the departmental dose-optimization program which includes automated exposure control, adjustment of the mA and/or kV according to patient size and/or use of iterative reconstruction technique. CONTRAST:  3mL OMNIPAQUE IOHEXOL 350 MG/ML SOLN COMPARISON:  Chest radiograph dated 07/22/2021. FINDINGS: CTA CHEST FINDINGS Cardiovascular: There is no cardiomegaly or pericardial effusion. There is 3 vessel coronary vascular calcification. Moderate atherosclerotic calcification of the thoracic aorta. No aneurysmal dilatation. The origins of the great vessels of the aortic arch appear patent. No pulmonary artery embolus identified. Mediastinum/Nodes: There is no hilar or mediastinal adenopathy. The esophagus is grossly unremarkable. No mediastinal fluid collection. Right-sided Port-A-Cath with tip at the cavoatrial junction. Lungs/Pleura: Minimal bibasilar dependent atelectasis. No focal consolidation, pleural effusion, or pneumothorax. The central airways are patent. Musculoskeletal: No acute osseous pathology. There is contusion of the subcutaneous soft tissues of the posterior lower chest wall with a 2 x 9 cm subcutaneous hematoma. Degenerative changes of the spine. Review of the MIP images confirms the above findings. CT ABDOMEN and PELVIS FINDINGS No intra-abdominal free air or free fluid. Hepatobiliary: There is irregularity of the liver contour consistent with cirrhosis. Cholecystectomy. Mild dilatation of the common bile duct, likely post cholecystectomy. No retained calcified stone noted in the central CBD. Pancreas: Unremarkable. No pancreatic ductal dilatation or surrounding inflammatory changes. Spleen: Normal in size without focal abnormality. Adrenals/Urinary  Tract: The adrenal glands unremarkable. Subcentimeter left renal hypodense lesions are too small to characterize. There is no hydronephrosis on either side. There is symmetric enhancement and excretion of contrast by both kidneys. The visualized ureters and urinary bladder appear unremarkable. Stomach/Bowel: There is no bowel obstruction or active inflammation. Appendectomy. Vascular/Lymphatic: Advanced aortoiliac atherosclerotic disease. There is a 2.2 cm infrarenal aortic ectasia. The IVC is unremarkable. No portal venous gas. There is no adenopathy. Reproductive: The uterus  is anteverted. Probable small uterine fibroids. No adnexal masses. Other: Postsurgical changes of the anterior peritoneal wall, likely hernia repair. Musculoskeletal: Osteopenia with extensive degenerative changes of the spine. Grade 1 L4-L5 anterolisthesis. Chronic appearing L1 and L2 compression fractures. There is complete loss of vertebral body height centrally at L2. Multilevel disc desiccation and vacuum phenomena. No acute osseous pathology. Review of the MIP images confirms the above findings. IMPRESSION: 1. No acute intrathoracic, abdominal, or pelvic pathology. 2. No CT evidence of pulmonary embolism. 3. Contusion of the subcutaneous soft tissues of the posterior lower chest wall with a 2 x 9 cm subcutaneous hematoma. 4. Cirrhosis. 5. Aortic Atherosclerosis (ICD10-I70.0). Electronically Signed   By: Anner Crete M.D.   On: 07/23/2021 02:25   CT ABDOMEN PELVIS W CONTRAST  Result Date: 07/23/2021 CLINICAL DATA:  Trauma. EXAM: CT ANGIOGRAPHY CHEST CT ABDOMEN AND PELVIS WITH CONTRAST TECHNIQUE: Multidetector CT imaging of the chest was performed using the standard protocol during bolus administration of intravenous contrast. Multiplanar CT image reconstructions and MIPs were obtained to evaluate the vascular anatomy. Multidetector CT imaging of the abdomen and pelvis was performed using the standard protocol during bolus  administration of intravenous contrast. RADIATION DOSE REDUCTION: This exam was performed according to the departmental dose-optimization program which includes automated exposure control, adjustment of the mA and/or kV according to patient size and/or use of iterative reconstruction technique. CONTRAST:  67mL OMNIPAQUE IOHEXOL 350 MG/ML SOLN COMPARISON:  Chest radiograph dated 07/22/2021. FINDINGS: CTA CHEST FINDINGS Cardiovascular: There is no cardiomegaly or pericardial effusion. There is 3 vessel coronary vascular calcification. Moderate atherosclerotic calcification of the thoracic aorta. No aneurysmal dilatation. The origins of the great vessels of the aortic arch appear patent. No pulmonary artery embolus identified. Mediastinum/Nodes: There is no hilar or mediastinal adenopathy. The esophagus is grossly unremarkable. No mediastinal fluid collection. Right-sided Port-A-Cath with tip at the cavoatrial junction. Lungs/Pleura: Minimal bibasilar dependent atelectasis. No focal consolidation, pleural effusion, or pneumothorax. The central airways are patent. Musculoskeletal: No acute osseous pathology. There is contusion of the subcutaneous soft tissues of the posterior lower chest wall with a 2 x 9 cm subcutaneous hematoma. Degenerative changes of the spine. Review of the MIP images confirms the above findings. CT ABDOMEN and PELVIS FINDINGS No intra-abdominal free air or free fluid. Hepatobiliary: There is irregularity of the liver contour consistent with cirrhosis. Cholecystectomy. Mild dilatation of the common bile duct, likely post cholecystectomy. No retained calcified stone noted in the central CBD. Pancreas: Unremarkable. No pancreatic ductal dilatation or surrounding inflammatory changes. Spleen: Normal in size without focal abnormality. Adrenals/Urinary Tract: The adrenal glands unremarkable. Subcentimeter left renal hypodense lesions are too small to characterize. There is no hydronephrosis on either  side. There is symmetric enhancement and excretion of contrast by both kidneys. The visualized ureters and urinary bladder appear unremarkable. Stomach/Bowel: There is no bowel obstruction or active inflammation. Appendectomy. Vascular/Lymphatic: Advanced aortoiliac atherosclerotic disease. There is a 2.2 cm infrarenal aortic ectasia. The IVC is unremarkable. No portal venous gas. There is no adenopathy. Reproductive: The uterus is anteverted. Probable small uterine fibroids. No adnexal masses. Other: Postsurgical changes of the anterior peritoneal wall, likely hernia repair. Musculoskeletal: Osteopenia with extensive degenerative changes of the spine. Grade 1 L4-L5 anterolisthesis. Chronic appearing L1 and L2 compression fractures. There is complete loss of vertebral body height centrally at L2. Multilevel disc desiccation and vacuum phenomena. No acute osseous pathology. Review of the MIP images confirms the above findings. IMPRESSION: 1. No acute intrathoracic, abdominal, or  pelvic pathology. 2. No CT evidence of pulmonary embolism. 3. Contusion of the subcutaneous soft tissues of the posterior lower chest wall with a 2 x 9 cm subcutaneous hematoma. 4. Cirrhosis. 5. Aortic Atherosclerosis (ICD10-I70.0). Electronically Signed   By: Anner Crete M.D.   On: 07/23/2021 02:25   DG Lumbar Spine Complete  Result Date: 07/22/2021 CLINICAL DATA:  Low back pain after a fall. EXAM: LUMBAR SPINE - COMPLETE 4+ VIEW COMPARISON:  Flexion and extension lumbar spine 11/14/2009. MRI lumbar spine 07/04/2018 FINDINGS: Five lumbar type vertebral bodies. Mild lumbar scoliosis convex towards the left. Slight anterior subluxation at L4-5 is stable since prior study. There is compression of the superior endplate of L2 with mild retropulsion of fracture fragments. Lucent lesions in the L1, L2 and L3 vertebrae. These changes correspond with metastatic lesions seen on prior MRI. Known metastatic lesion at the sacrum is less well  demonstrated on plain radiographs in comparison to the prior MRI. The compression deformity at L2 appears mildly progressed since the prior MRI. Degenerative changes with disc space narrowing and endplate osteophyte formation throughout. Degenerative changes in the posterior facet joints. Vascular calcification in the aorta. IMPRESSION: 1. Metastatic lesions again demonstrated in the lumbar spine. Progression of compression at L2 since the prior MRI. 2. Mild lumbar scoliosis and moderate degenerative changes. Electronically Signed   By: Lucienne Capers M.D.   On: 07/22/2021 22:20   DG Chest 2 View  Result Date: 07/22/2021 CLINICAL DATA:  Hypoxia.  Low back pain after a fall. EXAM: CHEST - 2 VIEW COMPARISON:  CT 03/01/2021 FINDINGS: Power port type central venous catheter with tip over the cavoatrial junction region. No pneumothoraces. Shallow inspiration with atelectasis in the lung bases. No pleural effusions. No pneumothorax. Heart size and pulmonary vascularity are normal. Calcification of the aorta. Degenerative changes in the spine and shoulders. IMPRESSION: Shallow inspiration with atelectasis in the lung bases. Aortic atherosclerosis. Electronically Signed   By: Lucienne Capers M.D.   On: 07/22/2021 22:15   CT Head Wo Contrast  Result Date: 07/22/2021 CLINICAL DATA:  Fall. EXAM: CT HEAD WITHOUT CONTRAST CT MAXILLOFACIAL WITHOUT CONTRAST CT CERVICAL SPINE WITHOUT CONTRAST TECHNIQUE: Multidetector CT imaging of the head, cervical spine, and maxillofacial structures were performed using the standard protocol without intravenous contrast. Multiplanar CT image reconstructions of the cervical spine and maxillofacial structures were also generated. RADIATION DOSE REDUCTION: This exam was performed according to the departmental dose-optimization program which includes automated exposure control, adjustment of the mA and/or kV according to patient size and/or use of iterative reconstruction technique.  COMPARISON:  Head CT dated 07/07/2021. FINDINGS: CT HEAD FINDINGS Brain: Mild age-related atrophy and chronic microvascular ischemic changes. There is no acute intracranial hemorrhage. No mass effect or midline shift. No extra-axial fluid collection. Vascular: No hyperdense vessel or unexpected calcification. Skull: Normal. Negative for fracture or focal lesion. Other: Small right frontal scalp contusion. CT MAXILLOFACIAL FINDINGS Osseous: There is an age indeterminate fracture of the nasal bone with mild deviation of the nose to the right. Clinical correlation is recommended. No other acute fracture. No mandibular subluxation. Orbits: Negative. No traumatic or inflammatory finding. Sinuses: Clear. Soft tissues: Negative. CT CERVICAL SPINE FINDINGS Alignment: No acute subluxation. Skull base and vertebrae: No acute fracture. Soft tissues and spinal canal: No prevertebral fluid or swelling. No visible canal hematoma. Disc levels:  Multilevel degenerative changes. Upper chest: Partially visualized right-sided central venous line. Other: Bilateral carotid bulb calcified plaques. IMPRESSION: 1. No acute intracranial pathology. Mild age-related  atrophy and chronic microvascular ischemic changes. 2. No acute/traumatic cervical spine pathology. 3. Age indeterminate fracture of the nasal bone with mild deviation of the nose to the right. Electronically Signed   By: Anner Crete M.D.   On: 07/22/2021 21:50   CT Cervical Spine Wo Contrast  Result Date: 07/22/2021 CLINICAL DATA:  Fall. EXAM: CT HEAD WITHOUT CONTRAST CT MAXILLOFACIAL WITHOUT CONTRAST CT CERVICAL SPINE WITHOUT CONTRAST TECHNIQUE: Multidetector CT imaging of the head, cervical spine, and maxillofacial structures were performed using the standard protocol without intravenous contrast. Multiplanar CT image reconstructions of the cervical spine and maxillofacial structures were also generated. RADIATION DOSE REDUCTION: This exam was performed according to  the departmental dose-optimization program which includes automated exposure control, adjustment of the mA and/or kV according to patient size and/or use of iterative reconstruction technique. COMPARISON:  Head CT dated 07/07/2021. FINDINGS: CT HEAD FINDINGS Brain: Mild age-related atrophy and chronic microvascular ischemic changes. There is no acute intracranial hemorrhage. No mass effect or midline shift. No extra-axial fluid collection. Vascular: No hyperdense vessel or unexpected calcification. Skull: Normal. Negative for fracture or focal lesion. Other: Small right frontal scalp contusion. CT MAXILLOFACIAL FINDINGS Osseous: There is an age indeterminate fracture of the nasal bone with mild deviation of the nose to the right. Clinical correlation is recommended. No other acute fracture. No mandibular subluxation. Orbits: Negative. No traumatic or inflammatory finding. Sinuses: Clear. Soft tissues: Negative. CT CERVICAL SPINE FINDINGS Alignment: No acute subluxation. Skull base and vertebrae: No acute fracture. Soft tissues and spinal canal: No prevertebral fluid or swelling. No visible canal hematoma. Disc levels:  Multilevel degenerative changes. Upper chest: Partially visualized right-sided central venous line. Other: Bilateral carotid bulb calcified plaques. IMPRESSION: 1. No acute intracranial pathology. Mild age-related atrophy and chronic microvascular ischemic changes. 2. No acute/traumatic cervical spine pathology. 3. Age indeterminate fracture of the nasal bone with mild deviation of the nose to the right. Electronically Signed   By: Anner Crete M.D.   On: 07/22/2021 21:50   CT MAXILLOFACIAL WO CONTRAST  Result Date: 07/22/2021 CLINICAL DATA:  Fall. EXAM: CT HEAD WITHOUT CONTRAST CT MAXILLOFACIAL WITHOUT CONTRAST CT CERVICAL SPINE WITHOUT CONTRAST TECHNIQUE: Multidetector CT imaging of the head, cervical spine, and maxillofacial structures were performed using the standard protocol without  intravenous contrast. Multiplanar CT image reconstructions of the cervical spine and maxillofacial structures were also generated. RADIATION DOSE REDUCTION: This exam was performed according to the departmental dose-optimization program which includes automated exposure control, adjustment of the mA and/or kV according to patient size and/or use of iterative reconstruction technique. COMPARISON:  Head CT dated 07/07/2021. FINDINGS: CT HEAD FINDINGS Brain: Mild age-related atrophy and chronic microvascular ischemic changes. There is no acute intracranial hemorrhage. No mass effect or midline shift. No extra-axial fluid collection. Vascular: No hyperdense vessel or unexpected calcification. Skull: Normal. Negative for fracture or focal lesion. Other: Small right frontal scalp contusion. CT MAXILLOFACIAL FINDINGS Osseous: There is an age indeterminate fracture of the nasal bone with mild deviation of the nose to the right. Clinical correlation is recommended. No other acute fracture. No mandibular subluxation. Orbits: Negative. No traumatic or inflammatory finding. Sinuses: Clear. Soft tissues: Negative. CT CERVICAL SPINE FINDINGS Alignment: No acute subluxation. Skull base and vertebrae: No acute fracture. Soft tissues and spinal canal: No prevertebral fluid or swelling. No visible canal hematoma. Disc levels:  Multilevel degenerative changes. Upper chest: Partially visualized right-sided central venous line. Other: Bilateral carotid bulb calcified plaques. IMPRESSION: 1. No acute intracranial pathology. Mild  age-related atrophy and chronic microvascular ischemic changes. 2. No acute/traumatic cervical spine pathology. 3. Age indeterminate fracture of the nasal bone with mild deviation of the nose to the right. Electronically Signed   By: Anner Crete M.D.   On: 07/22/2021 21:50     Assessment and plan- Patient is a 71 y.o. 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 She is here to  discuss ct scan results and further management  I have reviewed CT chest/ abdomen and bone scan images independently and discussed findings with patient which overall shows stable disease. She has been off tecentriq since may 2023 due to worsening confusion and hypothyroidism. 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 TSH better but remains elevated at 38. Will increase dose to 125 mcg  Confusion/ dementia- MRI brain negative. Seen by neurology.  Started on Seroquel.  Continue to monitor   Visit Diagnosis 1. Small cell lung cancer, right upper lobe ([place])   2. Dementia with behavioral disturbance ([place])   3. Autoimmune hypothyroidism      Dr. Randa Evens, MD, MPH [facility] at [facility] [phone] 08/16/2021 12:41 PM

## 2021-08-16 NOTE — Progress Notes (Signed)
error 

## 2021-08-16 NOTE — Therapy (Signed)
[facility] [place], [place], [place], [address] Phone: [phone]   Fax:  [phone]  Occupational Therapy Screen:  Patient Details  Name: Anne [name] MRN: [id number] Date of Birth: Jul 30, 1950 No data recorded  Encounter Date: 08/16/2021   OT End of Session - 08/16/21 1820     Visit Number 0             Past Medical History:  Diagnosis Date   Arthritis    Cirrhosis ([place])    Dementia ([place])    Depression    GERD (gastroesophageal reflux disease)    Hyperlipidemia    Hypertension    Lung cancer ([place])    Metastatic bone cancer     Past Surgical History:  Procedure Laterality Date   [place]   COLONOSCOPY WITH PROPOFOL N/A 11/15/2016   Procedure: COLONOSCOPY WITH PROPOFOL;  Surgeon: Jonathon Bellows, MD;  Location: [facility] ENDOSCOPY;  Service: Gastroenterology;  Laterality: N/A;   ENDOBRONCHIAL ULTRASOUND Right 07/30/2018   Procedure: ENDOBRONCHIAL ULTRASOUND;  Surgeon: Laverle Hobby, MD;  Location: [facility] ORS;  Service: Pulmonary;  Laterality: Right;   ESOPHAGOGASTRODUODENOSCOPY (EGD) WITH PROPOFOL N/A 01/07/2017   Procedure: ESOPHAGOGASTRODUODENOSCOPY (EGD) WITH PROPOFOL;  Surgeon: Jonathon Bellows, MD;  Location: [facility] ENDOSCOPY;  Service: Gastroenterology;  Laterality: N/A;   LAPAROSCOPY N/A 03/01/2017   Procedure: LAPAROSCOPY DIAGNOSTIC;  Surgeon: Robert Bellow, MD;  Location: [facility] ORS;  Service: General;  Laterality: N/A;   PORTA CATH INSERTION N/A 08/14/2018   Procedure: PORTA CATH INSERTION;  Surgeon: Algernon Huxley, MD;  Location: [facility] CV LAB;  Service: Cardiovascular;  Laterality: N/A;   TONSILECTOMY, ADENOIDECTOMY, BILATERAL MYRINGOTOMY AND TUBES  1955   TONSILLECTOMY     VENTRAL HERNIA REPAIR N/A 03/01/2017   10 x 14 CM Ventralight ST mesh, intraperitoneal location.    VENTRAL HERNIA REPAIR N/A 03/01/2017   Procedure: HERNIA REPAIR VENTRAL ADULT;   Surgeon: Robert Bellow, MD;  Location: [facility] ORS;  Service: General;  Laterality: N/A;    There were no vitals filed for this visit.   Subjective Assessment - 08/16/21 1819     Subjective  I am doing okay.  My back hurts at times mostly when I get up in the morning.  I do not drive anymore my husband drives and brings me.  I walk around the house and in the ER with a dog.  I did fall a couple of times.    Pain Score --   No pain in the clinic today              Dr Janese Banks visit 08/16/21: Assessment and plan- Patient is a 71 y.o. 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 She is here to discuss ct scan results and further management   I have reviewed CT chest/ abdomen and bone scan images independently and discussed findings with patient which overall shows stable disease. She has been off tecentriq since may 2023 due to worsening confusion and hypothyroidism. 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 TSH better but remains elevated at 38. Will increase dose to 125 mcg   Confusion/ dementia- MRI brain negative. Seen by neurology.  Started on Seroquel.  Continue to monitor   Visit Diagnosis 1. Small cell lung cancer, right upper lobe ([place])   2. Dementia with behavioral disturbance ([place])  3. Autoimmune hypothyroidism       OT SCREEN 08/16/21:  Patient arrived at OT screen with a history of falls.  Husband report 1 was in the bathroom and 1 she had socks on.  Patient to has a history of low back pain but reported mostly when getting up in the morning.  Patient ambulated in to treatment room with no loss of balance.BERG balance test was done with patient.  She scored a 47/56 putting her at low risk for falling.  Patient did had some trouble standing on 1 leg as well as unsupported heel-to-toe.  Needed assistance.  Was able to alternate placing foot on stool just  needed some time. Did discuss with husband and patient about safety around the house.  [place] to light.  Shower chair.  They report there is no loose carpets in the house. Discussed also not safe to walk on uneven surfaces outside by herself. Provided her with a home program to practice twice a day  -sit to stand out of a firm chair.  Not using hands.  10 reps. -Sidestepping against the wall or at the counter in the kitchen about 1 to 2 minutes. -And heel raises at the kitchen counter 10 reps.  At this time no OT intervention or therapy needed.  Discussed with husband at the end of session home program for patient to do twice a day. To follow-up with me as needed.                                Visit Diagnosis: History of falling    Problem List Patient Active Problem List   Diagnosis Date Noted   Closed fracture of nasal bones 07/26/2021   Fall 07/26/2021   Head injury 07/07/2021   Left hip pain 07/07/2021   Lipoma 07/07/2021   Slurred speech 06/07/2021   Hematoma 01/23/2021   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   Aortic atherosclerosis ([place]) 08/24/2020   Cognitive impairment 03/14/2020   Left shoulder pain 07/22/2019   Hypothyroidism 02/16/2019   Goals of care, counseling/discussion 08/06/2018   Small cell lung cancer, right upper lobe ([place]) 08/06/2018   Malignant neoplasm metastatic to bone ([place]) 08/06/2018   Skin tear of forearm without complication, initial encounter 12/06/2017   Bruising 12/06/2017   Overweight 08/05/2017   Anemia 01/23/2017   Cirrhosis of liver ([place]) 01/23/2017   Gastritis 01/23/2017   Hypokalemia 01/03/2017   Heart murmur 07/27/2016   Low back pain 08/15/2015   Benign essential HTN 02/22/2015   HLD (hyperlipidemia) 02/22/2015   Depression 02/22/2015    Rosalyn Gess, OTR/L,CLT 08/16/2021, 6:22 PM  [place] at [facility] [address] [place], [place],  [address] Phone: [phone]   Fax:  [phone]  Name: Anne [name] MRN: [id number] Date of Birth: [birth year]

## 2021-08-17 DIAGNOSIS — F32A Depression, unspecified: Secondary | ICD-10-CM | POA: Diagnosis not present

## 2021-08-17 DIAGNOSIS — S81812D Laceration without foreign body, left lower leg, subsequent encounter: Secondary | ICD-10-CM | POA: Diagnosis not present

## 2021-08-17 DIAGNOSIS — I10 Essential (primary) hypertension: Secondary | ICD-10-CM | POA: Diagnosis not present

## 2021-08-17 DIAGNOSIS — F419 Anxiety disorder, unspecified: Secondary | ICD-10-CM | POA: Diagnosis not present

## 2021-08-17 DIAGNOSIS — K219 Gastro-esophageal reflux disease without esophagitis: Secondary | ICD-10-CM | POA: Diagnosis not present

## 2021-08-17 DIAGNOSIS — S51811D Laceration without foreign body of right forearm, subsequent encounter: Secondary | ICD-10-CM | POA: Diagnosis not present

## 2021-08-17 DIAGNOSIS — G3184 Mild cognitive impairment, so stated: Secondary | ICD-10-CM | POA: Diagnosis not present

## 2021-08-17 DIAGNOSIS — S022XXD Fracture of nasal bones, subsequent encounter for fracture with routine healing: Secondary | ICD-10-CM | POA: Diagnosis not present

## 2021-08-17 DIAGNOSIS — E785 Hyperlipidemia, unspecified: Secondary | ICD-10-CM | POA: Diagnosis not present

## 2021-08-17 DIAGNOSIS — E039 Hypothyroidism, unspecified: Secondary | ICD-10-CM | POA: Diagnosis not present

## 2021-08-17 DIAGNOSIS — H9190 Unspecified hearing loss, unspecified ear: Secondary | ICD-10-CM | POA: Diagnosis not present

## 2021-08-17 DIAGNOSIS — F17210 Nicotine dependence, cigarettes, uncomplicated: Secondary | ICD-10-CM | POA: Diagnosis not present

## 2021-08-17 DIAGNOSIS — Z9181 History of falling: Secondary | ICD-10-CM | POA: Diagnosis not present

## 2021-08-18 ENCOUNTER — Inpatient Hospital Stay (HOSPITAL_BASED_OUTPATIENT_CLINIC_OR_DEPARTMENT_OTHER): Payer: PPO | Admitting: Hospice and Palliative Medicine

## 2021-08-18 DIAGNOSIS — C3411 Malignant neoplasm of upper lobe, right bronchus or lung: Secondary | ICD-10-CM

## 2021-08-18 NOTE — Progress Notes (Signed)
Unable to reach patient by phone.  Will reschedule.

## 2021-08-19 NOTE — Progress Notes (Unsigned)
Mailed a letter to patient to call the office.  Makana Feigel,cma

## 2021-08-21 ENCOUNTER — Telehealth: Payer: Self-pay

## 2021-08-21 NOTE — Telephone Encounter (Signed)
Initial palliative visit scheduled with Anne [name].  She does not do well with virtual and have scheduled for in person

## 2021-08-28 DIAGNOSIS — H9190 Unspecified hearing loss, unspecified ear: Secondary | ICD-10-CM | POA: Diagnosis not present

## 2021-08-28 DIAGNOSIS — I10 Essential (primary) hypertension: Secondary | ICD-10-CM | POA: Diagnosis not present

## 2021-08-28 DIAGNOSIS — Z9181 History of falling: Secondary | ICD-10-CM | POA: Diagnosis not present

## 2021-08-28 DIAGNOSIS — E785 Hyperlipidemia, unspecified: Secondary | ICD-10-CM | POA: Diagnosis not present

## 2021-08-28 DIAGNOSIS — E039 Hypothyroidism, unspecified: Secondary | ICD-10-CM | POA: Diagnosis not present

## 2021-08-28 DIAGNOSIS — S022XXD Fracture of nasal bones, subsequent encounter for fracture with routine healing: Secondary | ICD-10-CM | POA: Diagnosis not present

## 2021-08-28 DIAGNOSIS — S81812D Laceration without foreign body, left lower leg, subsequent encounter: Secondary | ICD-10-CM | POA: Diagnosis not present

## 2021-08-28 DIAGNOSIS — S51811D Laceration without foreign body of right forearm, subsequent encounter: Secondary | ICD-10-CM | POA: Diagnosis not present

## 2021-08-28 DIAGNOSIS — F17210 Nicotine dependence, cigarettes, uncomplicated: Secondary | ICD-10-CM | POA: Diagnosis not present

## 2021-08-28 DIAGNOSIS — K219 Gastro-esophageal reflux disease without esophagitis: Secondary | ICD-10-CM | POA: Diagnosis not present

## 2021-08-28 DIAGNOSIS — F32A Depression, unspecified: Secondary | ICD-10-CM | POA: Diagnosis not present

## 2021-08-28 DIAGNOSIS — F419 Anxiety disorder, unspecified: Secondary | ICD-10-CM | POA: Diagnosis not present

## 2021-08-28 DIAGNOSIS — G3184 Mild cognitive impairment, so stated: Secondary | ICD-10-CM | POA: Diagnosis not present

## 2021-08-30 NOTE — Progress Notes (Signed)
Could not reach patient, a letter was sent out today.  Bralen Wiltgen,cma

## 2021-09-04 DIAGNOSIS — Z9181 History of falling: Secondary | ICD-10-CM | POA: Diagnosis not present

## 2021-09-04 DIAGNOSIS — F17210 Nicotine dependence, cigarettes, uncomplicated: Secondary | ICD-10-CM | POA: Diagnosis not present

## 2021-09-04 DIAGNOSIS — G3184 Mild cognitive impairment, so stated: Secondary | ICD-10-CM | POA: Diagnosis not present

## 2021-09-04 DIAGNOSIS — E039 Hypothyroidism, unspecified: Secondary | ICD-10-CM | POA: Diagnosis not present

## 2021-09-04 DIAGNOSIS — S022XXD Fracture of nasal bones, subsequent encounter for fracture with routine healing: Secondary | ICD-10-CM | POA: Diagnosis not present

## 2021-09-04 DIAGNOSIS — S81812D Laceration without foreign body, left lower leg, subsequent encounter: Secondary | ICD-10-CM | POA: Diagnosis not present

## 2021-09-04 DIAGNOSIS — F32A Depression, unspecified: Secondary | ICD-10-CM | POA: Diagnosis not present

## 2021-09-04 DIAGNOSIS — F419 Anxiety disorder, unspecified: Secondary | ICD-10-CM | POA: Diagnosis not present

## 2021-09-04 DIAGNOSIS — S51811D Laceration without foreign body of right forearm, subsequent encounter: Secondary | ICD-10-CM | POA: Diagnosis not present

## 2021-09-04 DIAGNOSIS — H9190 Unspecified hearing loss, unspecified ear: Secondary | ICD-10-CM | POA: Diagnosis not present

## 2021-09-04 DIAGNOSIS — E785 Hyperlipidemia, unspecified: Secondary | ICD-10-CM | POA: Diagnosis not present

## 2021-09-04 DIAGNOSIS — I10 Essential (primary) hypertension: Secondary | ICD-10-CM | POA: Diagnosis not present

## 2021-09-04 DIAGNOSIS — K219 Gastro-esophageal reflux disease without esophagitis: Secondary | ICD-10-CM | POA: Diagnosis not present

## 2021-09-07 ENCOUNTER — Telehealth: Payer: Self-pay | Admitting: Family Medicine

## 2021-09-07 NOTE — Telephone Encounter (Signed)
Copied from [place]. Topic: Medicare AWV >> Sep 07, 2021 11:13 AM Devoria Glassing wrote: Reason for CRM: Called patient to schedule Annual Wellness Visit.  Please schedule with Nurse Health Advisor Denisa O'Brien-Blaney, LPN at [facility]. This appt can be telephone or office visit.  Please call [phone] ask for Juliann Pulse.  No voicemail

## 2021-09-11 DIAGNOSIS — I10 Essential (primary) hypertension: Secondary | ICD-10-CM | POA: Diagnosis not present

## 2021-09-11 DIAGNOSIS — S81812D Laceration without foreign body, left lower leg, subsequent encounter: Secondary | ICD-10-CM | POA: Diagnosis not present

## 2021-09-11 DIAGNOSIS — K219 Gastro-esophageal reflux disease without esophagitis: Secondary | ICD-10-CM | POA: Diagnosis not present

## 2021-09-11 DIAGNOSIS — E039 Hypothyroidism, unspecified: Secondary | ICD-10-CM | POA: Diagnosis not present

## 2021-09-11 DIAGNOSIS — F419 Anxiety disorder, unspecified: Secondary | ICD-10-CM | POA: Diagnosis not present

## 2021-09-11 DIAGNOSIS — G3184 Mild cognitive impairment, so stated: Secondary | ICD-10-CM | POA: Diagnosis not present

## 2021-09-11 DIAGNOSIS — H9190 Unspecified hearing loss, unspecified ear: Secondary | ICD-10-CM | POA: Diagnosis not present

## 2021-09-11 DIAGNOSIS — S51811D Laceration without foreign body of right forearm, subsequent encounter: Secondary | ICD-10-CM | POA: Diagnosis not present

## 2021-09-11 DIAGNOSIS — Z9181 History of falling: Secondary | ICD-10-CM | POA: Diagnosis not present

## 2021-09-11 DIAGNOSIS — S022XXD Fracture of nasal bones, subsequent encounter for fracture with routine healing: Secondary | ICD-10-CM | POA: Diagnosis not present

## 2021-09-11 DIAGNOSIS — F32A Depression, unspecified: Secondary | ICD-10-CM | POA: Diagnosis not present

## 2021-09-11 DIAGNOSIS — F17210 Nicotine dependence, cigarettes, uncomplicated: Secondary | ICD-10-CM | POA: Diagnosis not present

## 2021-09-11 DIAGNOSIS — E785 Hyperlipidemia, unspecified: Secondary | ICD-10-CM | POA: Diagnosis not present

## 2021-09-13 ENCOUNTER — Other Ambulatory Visit: Payer: Self-pay | Admitting: Oncology

## 2021-09-18 ENCOUNTER — Inpatient Hospital Stay: Payer: PPO | Attending: Oncology | Admitting: Hospice and Palliative Medicine

## 2021-09-18 DIAGNOSIS — Z79890 Hormone replacement therapy (postmenopausal): Secondary | ICD-10-CM | POA: Insufficient documentation

## 2021-09-18 DIAGNOSIS — E063 Autoimmune thyroiditis: Secondary | ICD-10-CM | POA: Insufficient documentation

## 2021-09-18 DIAGNOSIS — Z8041 Family history of malignant neoplasm of ovary: Secondary | ICD-10-CM | POA: Insufficient documentation

## 2021-09-18 DIAGNOSIS — Z79899 Other long term (current) drug therapy: Secondary | ICD-10-CM | POA: Insufficient documentation

## 2021-09-18 DIAGNOSIS — C7951 Secondary malignant neoplasm of bone: Secondary | ICD-10-CM | POA: Insufficient documentation

## 2021-09-18 DIAGNOSIS — Z5112 Encounter for antineoplastic immunotherapy: Secondary | ICD-10-CM | POA: Insufficient documentation

## 2021-09-18 DIAGNOSIS — K7460 Unspecified cirrhosis of liver: Secondary | ICD-10-CM | POA: Insufficient documentation

## 2021-09-18 DIAGNOSIS — F03918 Unspecified dementia, unspecified severity, with other behavioral disturbance: Secondary | ICD-10-CM | POA: Insufficient documentation

## 2021-09-18 DIAGNOSIS — F17290 Nicotine dependence, other tobacco product, uncomplicated: Secondary | ICD-10-CM | POA: Insufficient documentation

## 2021-09-18 DIAGNOSIS — C3411 Malignant neoplasm of upper lobe, right bronchus or lung: Secondary | ICD-10-CM | POA: Insufficient documentation

## 2021-09-18 NOTE — Progress Notes (Signed)
Did not reach patient by phone for scheduled telephone visit.  Unable to leave voicemail.  Will reschedule.

## 2021-09-20 ENCOUNTER — Ambulatory Visit (INDEPENDENT_AMBULATORY_CARE_PROVIDER_SITE_OTHER): Payer: PPO | Admitting: Family Medicine

## 2021-09-20 ENCOUNTER — Encounter: Payer: Self-pay | Admitting: Family Medicine

## 2021-09-20 VITALS — BP 120/80 | HR 88 | Temp 98.2°F | Ht 67.0 in | Wt 171.2 lb

## 2021-09-20 DIAGNOSIS — F03A18 Unspecified dementia, mild, with other behavioral disturbance: Secondary | ICD-10-CM

## 2021-09-20 DIAGNOSIS — L821 Other seborrheic keratosis: Secondary | ICD-10-CM

## 2021-09-20 DIAGNOSIS — T148XXA Other injury of unspecified body region, initial encounter: Secondary | ICD-10-CM | POA: Diagnosis not present

## 2021-09-20 DIAGNOSIS — F331 Major depressive disorder, recurrent, moderate: Secondary | ICD-10-CM

## 2021-09-20 DIAGNOSIS — E032 Hypothyroidism due to medicaments and other exogenous substances: Secondary | ICD-10-CM

## 2021-09-20 LAB — TSH: TSH: 39.05 u[IU]/mL — ABNORMAL HIGH (ref 0.35–5.50)

## 2021-09-20 MED ORDER — SERTRALINE HCL 100 MG PO TABS: 150.0000 mg | ORAL_TABLET | Freq: Every day | ORAL | 1 refills | Status: DC

## 2021-09-20 NOTE — Assessment & Plan Note (Signed)
Discussed that the lesion appears to be a seborrheic keratosis.  Discussed that this is benign.  They will monitor and if it changes quickly or develops other features they will let us know.

## 2021-09-20 NOTE — Assessment & Plan Note (Signed)
Continues to be an issue.  We will increase her Zoloft to 150 mg daily.  She will continue Wellbutrin 300 mg daily.  Discussed if she develops intent or plan to harm herself she needs to go to the emergency department.

## 2021-09-20 NOTE — Assessment & Plan Note (Signed)
Generally stable.  She can continue Exelon and Seroquel as prescribed by neurology.  She will no longer drive.

## 2021-09-20 NOTE — Assessment & Plan Note (Signed)
Check TSH.  Continue Synthroid 125 mcg daily.

## 2021-09-20 NOTE — Assessment & Plan Note (Signed)
Significantly improved.  Discussed that this should resolve over the next month or so.

## 2021-09-20 NOTE — Progress Notes (Signed)
Tommi Rumps, MD Phone: [phone]  Anne [name] is a 71 y.o. female who presents today for follow-up.  Depression: Patient notes this is so-so.  She notes at times she feels as though she wants to give up though has no plan or intent to harm herself.  She is on Zoloft 100 mg daily and Wellbutrin 300 mg daily.  Dementia: Patient followed up with neurology recently.  Her husband reports her memory is stable.  Neurology started her on Seroquel 12.5 mg nightly and the patient notes that has helped with her sleep.  She is also on Exelon.  She is no longer driving.  She has not had any wandering issues.  Skin lesion: The patient's husband reports she has a skin lesion on her right breast.  He wonders if it is a cyst or if it is dry skin.  There is no pain.  They have noted it for about a week.  Hematoma: They report the hematoma on her back has improved significantly.  Hypothyroidism: Patient is taking Synthroid.  Needs recheck today.  Social History   Tobacco Use  Smoking Status Former   Packs/day: 2.00   Years: 50.00   Total pack years: 100.00   Types: Cigarettes, E-cigarettes   Quit date: 02/07/2012   Years since quitting: 9.6  Smokeless Tobacco Never    Current Outpatient Medications on File Prior to Visit  Medication Sig Dispense Refill   amLODipine (NORVASC) 5 MG tablet Take 1 tablet (5 mg total) by mouth daily. 90 tablet 3   buPROPion (WELLBUTRIN XL) 300 MG 24 hr tablet Take 1 tablet by mouth once daily 90 tablet 3   clobetasol cream (TEMOVATE) 5.05 % Apply 1 application topically 2 (two) times daily. Lower back 60 g 1   fluticasone (FLONASE) 50 MCG/ACT nasal spray Place 2 sprays into both nostrils daily. 16 g 6   folic acid (FOLVITE) 1 MG tablet Take 1 tablet by mouth once daily 90 tablet 0   ibuprofen (ADVIL,MOTRIN) 200 MG tablet Take 400 mg by mouth every 8 (eight) hours as needed for headache or moderate pain.     levothyroxine (SYNTHROID) 125 MCG tablet Take 1 tablet  (125 mcg total) by mouth daily before breakfast. 30 tablet 3   lidocaine-prilocaine (EMLA) cream Apply to affected area once 30 g 3   Multiple Vitamins-Minerals (PRESERVISION AREDS 2) CAPS Take 1 capsule by mouth 2 (two) times a day.      pantoprazole (PROTONIX) 40 MG tablet TAKE 1 TABLET BY MOUTH TWICE DAILY BEFORE A MEAL 180 tablet 0   potassium chloride SA (KLOR-CON M) 20 MEQ tablet Take 1 tablet by mouth once daily 14 tablet 0   QUEtiapine (SEROQUEL) 25 MG tablet Take 0.5 tablets (12.5 mg total) by mouth at bedtime. 15 tablet 6   rivastigmine (EXELON) 1.5 MG capsule Take 1 capsule (1.5 mg total) by mouth 2 (two) times daily. 60 capsule 6   rosuvastatin (CRESTOR) 20 MG tablet Take 1 tablet by mouth once daily 90 tablet 0   Current Facility-Administered Medications on File Prior to Visit  Medication Dose Route Frequency Provider Last Rate Last Admin   denosumab (XGEVA) injection 120 mg  120 mg Subcutaneous Q30 days Sindy Guadeloupe, MD   120 mg at 03/05/19 1525     ROS see history of present illness  Objective  Physical Exam Vitals:   09/20/21 1016  BP: 120/80  Pulse: 88  Temp: 98.2 F (36.8 C)  SpO2: 96%  BP Readings from Last 3 Encounters:  09/20/21 120/80  08/16/21 (!) 141/75  08/15/21 130/86   Wt Readings from Last 3 Encounters:  09/20/21 171 lb 3.2 oz (77.7 kg)  08/16/21 169 lb 11.2 oz (77 kg)  08/15/21 171 lb (77.6 kg)    Physical Exam Constitutional:      General: She is not in acute distress.    Appearance: She is not diaphoretic.  Cardiovascular:     Rate and Rhythm: Normal rate and regular rhythm.     Heart sounds: Normal heart sounds.  Pulmonary:     Effort: Pulmonary effort is normal.     Breath sounds: Normal breath sounds.  Chest:    Musculoskeletal:     Comments: Hematoma on lower back has improved significantly, nontender  Skin:    General: Skin is warm and dry.  Neurological:     Mental Status: She is alert.      Assessment/Plan:  Please see individual problem list.  Problem List Items Addressed This Visit     Dementia ([place]) (Chronic)    Generally stable.  She can continue Exelon and Seroquel as prescribed by neurology.  She will no longer drive.      Relevant Medications   sertraline (ZOLOFT) 100 MG tablet   Depression (Chronic)    Continues to be an issue.  We will increase her Zoloft to 150 mg daily.  She will continue Wellbutrin 300 mg daily.  Discussed if she develops intent or plan to harm herself she needs to go to the emergency department.      Relevant Medications   sertraline (ZOLOFT) 100 MG tablet   Hypothyroidism - Primary (Chronic)    Check TSH.  Continue Synthroid 125 mcg daily.      Relevant Orders   TSH   Hematoma    Significantly improved.  Discussed that this should resolve over the next month or so.      Seborrheic keratosis    Discussed that the lesion appears to be a seborrheic keratosis.  Discussed that this is benign.  They will monitor and if it changes quickly or develops other features they will let us know.       Return in about 3 months (around 12/21/2021).   Tommi Rumps, MD [place]

## 2021-09-20 NOTE — Patient Instructions (Signed)
Nice to see you. We are going to increase your Zoloft to 150 mg daily.  This will hopefully help with your depression. We will check your thyroid function today.

## 2021-09-21 ENCOUNTER — Telehealth: Payer: Self-pay

## 2021-09-21 NOTE — Progress Notes (Signed)
Pt has been notified and is agreeable to increasing the synthroid med to 137 mcg and would like it sent to [facility] in [place]. I didn't see in her chart where he sent that dose its just currently the 125 mcg in her chart.

## 2021-09-21 NOTE — Telephone Encounter (Signed)
Called patient to inform her of her results but was unable to leave a VM

## 2021-09-25 DIAGNOSIS — F419 Anxiety disorder, unspecified: Secondary | ICD-10-CM | POA: Diagnosis not present

## 2021-09-25 DIAGNOSIS — F17210 Nicotine dependence, cigarettes, uncomplicated: Secondary | ICD-10-CM | POA: Diagnosis not present

## 2021-09-25 DIAGNOSIS — I10 Essential (primary) hypertension: Secondary | ICD-10-CM | POA: Diagnosis not present

## 2021-09-25 DIAGNOSIS — E039 Hypothyroidism, unspecified: Secondary | ICD-10-CM | POA: Diagnosis not present

## 2021-09-25 DIAGNOSIS — E785 Hyperlipidemia, unspecified: Secondary | ICD-10-CM | POA: Diagnosis not present

## 2021-09-25 DIAGNOSIS — H9190 Unspecified hearing loss, unspecified ear: Secondary | ICD-10-CM | POA: Diagnosis not present

## 2021-09-25 DIAGNOSIS — S81812D Laceration without foreign body, left lower leg, subsequent encounter: Secondary | ICD-10-CM | POA: Diagnosis not present

## 2021-09-25 DIAGNOSIS — Z9181 History of falling: Secondary | ICD-10-CM | POA: Diagnosis not present

## 2021-09-25 DIAGNOSIS — S022XXD Fracture of nasal bones, subsequent encounter for fracture with routine healing: Secondary | ICD-10-CM | POA: Diagnosis not present

## 2021-09-25 DIAGNOSIS — K219 Gastro-esophageal reflux disease without esophagitis: Secondary | ICD-10-CM | POA: Diagnosis not present

## 2021-09-25 DIAGNOSIS — G3184 Mild cognitive impairment, so stated: Secondary | ICD-10-CM | POA: Diagnosis not present

## 2021-09-25 DIAGNOSIS — F32A Depression, unspecified: Secondary | ICD-10-CM | POA: Diagnosis not present

## 2021-09-25 DIAGNOSIS — S51811D Laceration without foreign body of right forearm, subsequent encounter: Secondary | ICD-10-CM | POA: Diagnosis not present

## 2021-09-27 ENCOUNTER — Inpatient Hospital Stay (HOSPITAL_BASED_OUTPATIENT_CLINIC_OR_DEPARTMENT_OTHER): Payer: PPO | Admitting: Oncology

## 2021-09-27 ENCOUNTER — Inpatient Hospital Stay: Payer: PPO

## 2021-09-27 ENCOUNTER — Encounter: Payer: Self-pay | Admitting: Oncology

## 2021-09-27 VITALS — BP 156/93 | HR 75 | Temp 98.3°F | Resp 16 | Ht 67.0 in | Wt 173.8 lb

## 2021-09-27 DIAGNOSIS — F03918 Unspecified dementia, unspecified severity, with other behavioral disturbance: Secondary | ICD-10-CM | POA: Diagnosis not present

## 2021-09-27 DIAGNOSIS — E063 Autoimmune thyroiditis: Secondary | ICD-10-CM | POA: Diagnosis not present

## 2021-09-27 DIAGNOSIS — C3411 Malignant neoplasm of upper lobe, right bronchus or lung: Secondary | ICD-10-CM | POA: Diagnosis not present

## 2021-09-27 DIAGNOSIS — Z5112 Encounter for antineoplastic immunotherapy: Secondary | ICD-10-CM

## 2021-09-27 DIAGNOSIS — F17290 Nicotine dependence, other tobacco product, uncomplicated: Secondary | ICD-10-CM | POA: Diagnosis not present

## 2021-09-27 DIAGNOSIS — K7460 Unspecified cirrhosis of liver: Secondary | ICD-10-CM | POA: Diagnosis not present

## 2021-09-27 DIAGNOSIS — Z79899 Other long term (current) drug therapy: Secondary | ICD-10-CM | POA: Diagnosis not present

## 2021-09-27 DIAGNOSIS — Z8041 Family history of malignant neoplasm of ovary: Secondary | ICD-10-CM | POA: Diagnosis not present

## 2021-09-27 DIAGNOSIS — Z79890 Hormone replacement therapy (postmenopausal): Secondary | ICD-10-CM | POA: Diagnosis not present

## 2021-09-27 DIAGNOSIS — C7951 Secondary malignant neoplasm of bone: Secondary | ICD-10-CM | POA: Diagnosis not present

## 2021-09-27 LAB — CBC WITH DIFFERENTIAL/PLATELET
Abs Immature Granulocytes: 0.02 10*3/uL (ref 0.00–0.07)
Basophils Absolute: 0.1 10*3/uL (ref 0.0–0.1)
Basophils Relative: 1 %
Eosinophils Absolute: 0.6 10*3/uL — ABNORMAL HIGH (ref 0.0–0.5)
Eosinophils Relative: 9 %
HCT: 38.2 % (ref 36.0–46.0)
Hemoglobin: 12 g/dL (ref 12.0–15.0)
Immature Granulocytes: 0 %
Lymphocytes Relative: 17 %
Lymphs Abs: 1.1 10*3/uL (ref 0.7–4.0)
MCH: 29.3 pg (ref 26.0–34.0)
MCHC: 31.4 g/dL (ref 30.0–36.0)
MCV: 93.4 fL (ref 80.0–100.0)
Monocytes Absolute: 0.3 10*3/uL (ref 0.1–1.0)
Monocytes Relative: 5 %
Neutro Abs: 4.2 10*3/uL (ref 1.7–7.7)
Neutrophils Relative %: 68 %
Platelets: 181 10*3/uL (ref 150–400)
RBC: 4.09 MIL/uL (ref 3.87–5.11)
RDW: 15.1 % (ref 11.5–15.5)
WBC: 6.3 10*3/uL (ref 4.0–10.5)
nRBC: 0 % (ref 0.0–0.2)

## 2021-09-27 LAB — COMPREHENSIVE METABOLIC PANEL
ALT: 18 U/L (ref 0–44)
AST: 28 U/L (ref 15–41)
Albumin: 3.9 g/dL (ref 3.5–5.0)
Alkaline Phosphatase: 91 U/L (ref 38–126)
Anion gap: 8 (ref 5–15)
BUN: 18 mg/dL (ref 8–23)
CO2: 29 mmol/L (ref 22–32)
Calcium: 9.2 mg/dL (ref 8.9–10.3)
Chloride: 101 mmol/L (ref 98–111)
Creatinine, Ser: 1.05 mg/dL — ABNORMAL HIGH (ref 0.44–1.00)
GFR, Estimated: 56 mL/min — ABNORMAL LOW (ref >60)
Glucose, Bld: 93 mg/dL (ref 70–99)
Potassium: 3.9 mmol/L (ref 3.5–5.1)
Sodium: 138 mmol/L (ref 135–145)
Total Bilirubin: 0.6 mg/dL (ref 0.3–1.2)
Total Protein: 7.7 g/dL (ref 6.5–8.1)

## 2021-09-27 LAB — T4, FREE: Free T4: 0.52 ng/dL — ABNORMAL LOW (ref 0.61–1.12)

## 2021-09-27 MED ORDER — SODIUM CHLORIDE 0.9% FLUSH
10.0000 mL | INTRAVENOUS | Status: DC | PRN
  Filled 2021-09-27: qty 10

## 2021-09-27 MED ORDER — SODIUM CHLORIDE 0.9 % IV SOLN
1200.0000 mg | Freq: Once | INTRAVENOUS | Status: AC
  Administered 2021-09-27: 1200 mg via INTRAVENOUS
  Filled 2021-09-27: qty 20

## 2021-09-27 MED ORDER — HEPARIN SOD (PORK) LOCK FLUSH 100 UNIT/ML IV SOLN
500.0000 [IU] | Freq: Once | INTRAVENOUS | Status: AC | PRN
  Administered 2021-09-27: 500 [IU]
  Filled 2021-09-27: qty 5

## 2021-09-27 MED ORDER — SODIUM CHLORIDE 0.9 % IV SOLN
Freq: Once | INTRAVENOUS | Status: AC
  Filled 2021-09-27: qty 250

## 2021-09-27 NOTE — Patient Instructions (Signed)
[facility] AT [place]  Discharge Instructions: Thank you for choosing [facility] to provide your oncology and hematology care.  If you have a lab appointment with the [facility], please go directly to the [facility] and check in at the registration area.  Wear comfortable clothing and clothing appropriate for easy access to any Portacath or PICC line.   We strive to give you quality time with your provider. You may need to reschedule your appointment if you arrive late (15 or more minutes).  Arriving late affects you and other patients whose appointments are after yours.  Also, if you miss three or more appointments without notifying the office, you may be dismissed from the clinic at the provider's discretion.      For prescription refill requests, have your pharmacy contact our office and allow 72 hours for refills to be completed.    Today you received the following chemotherapy and/or immunotherapy agents: Tecentriq      To help prevent nausea and vomiting after your treatment, we encourage you to take your nausea medication as directed.  BELOW ARE SYMPTOMS THAT SHOULD BE REPORTED IMMEDIATELY: *FEVER GREATER THAN 100.4 F (38 C) OR HIGHER *CHILLS OR SWEATING *NAUSEA AND VOMITING THAT IS NOT CONTROLLED WITH YOUR NAUSEA MEDICATION *UNUSUAL SHORTNESS OF BREATH *UNUSUAL BRUISING OR BLEEDING *URINARY PROBLEMS (pain or burning when urinating, or frequent urination) *BOWEL PROBLEMS (unusual diarrhea, constipation, pain near the anus) TENDERNESS IN MOUTH AND THROAT WITH OR WITHOUT PRESENCE OF ULCERS (sore throat, sores in mouth, or a toothache) UNUSUAL RASH, SWELLING OR PAIN  UNUSUAL VAGINAL DISCHARGE OR ITCHING   Items with * indicate a potential emergency and should be followed up as soon as possible or go to the Emergency Department if any problems should occur.  Please show the CHEMOTHERAPY ALERT CARD or IMMUNOTHERAPY ALERT CARD at check-in to  the Emergency Department and triage nurse.  Should you have questions after your visit or need to cancel or reschedule your appointment, please contact [facility] CANCER [place] AT [place]  [phone] and follow the prompts.  Office hours are 8:00 a.m. to 4:30 p.m. Monday - Friday. Please note that voicemails left after 4:00 p.m. may not be returned until the following business day.  We are closed weekends and major holidays. You have access to a nurse at all times for urgent questions. Please call the main number to the clinic [phone] and follow the prompts.  For any non-urgent questions, you may also contact your provider using MyChart. We now offer e-Visits for anyone 54 and older to request care online for non-urgent symptoms. For details visit [email or web address].   Also download the MyChart app! Go to the app store, search "MyChart", open the app, select [facility], and log in with your MyChart username and password.  Masks are optional in the cancer centers. If you would like for your care team to wear a mask while they are taking care of you, please let them know. For doctor visits, patients may have with them one support person who is at least 73 years old. At this time, visitors are not allowed in the infusion area.

## 2021-09-27 NOTE — Progress Notes (Signed)
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  She has been referred to [place] for findings of bone metastases and her recent MRI.  She has a prior history of 3 packs/day day smoking for over 45 years and quit smoking 5 years ago.She had a CT chest abdomen pelvis in 2018 which showed a 5 mm lung nodule in the left lower lobe.  Recently over the last 2 months patient has been having worsening back pain and was referred to orthopedics.  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  Pathologic fracture of S1.   PET scan showed 2 RUL lung nodules, hilar and mediastinal adenopathy and widespread bone mets. MRI brain negative.   Patient completed palliative RT to her spine. Bronchoscopy showed small cell lung cancer.  Palliative carboplatin, etoposide and Tecentriq started on 08/18/2018. Scans after 4 cycles showed stable disease. 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  She reports being compliant but her  values fluctuate widely.    Interval history-patient is here alone today.  She reports doing well but is confused with regards to recent facts.  States that she is living with her boyfriend.  She is not sure if she is taking her thyroid medicine  ECOG PS- 1 Pain scale- 0 Opioid associated constipation- no  Review of systems- Review of Systems  Constitutional:  Negative for chills, fever, malaise/fatigue and weight loss.  HENT:  Negative for congestion, ear discharge and nosebleeds.   Eyes:  Negative for blurred vision.  Respiratory:  Negative for cough, hemoptysis, sputum production, shortness of breath and wheezing.   Cardiovascular:  Negative for chest pain, palpitations, orthopnea and claudication.  Gastrointestinal:  Negative for abdominal pain, blood in stool, constipation, diarrhea, heartburn, melena, nausea and vomiting.  Genitourinary:  Negative for dysuria, flank pain, frequency, hematuria and urgency.  Musculoskeletal:  Negative for back pain, joint pain and myalgias.  Skin:  Negative for rash.  Neurological:  Negative for dizziness, tingling, focal weakness, seizures, weakness and headaches.  Endo/Heme/Allergies:  Does not bruise/bleed easily.  Psychiatric/Behavioral:  Negative for depression and suicidal ideas. The patient does not have insomnia.       Allergies  Allergen Reactions   Bee Venom Swelling     Past Medical History:  Diagnosis Date   Arthritis    Cirrhosis ([place])    Dementia ([place])    Depression    GERD (gastroesophageal reflux disease)    Hyperlipidemia    Hypertension    Lung cancer ([place])    Metastatic bone cancer      Past Surgical History:  Procedure Laterality Date  [place]   COLONOSCOPY WITH PROPOFOL N/A 11/15/2016   Procedure: COLONOSCOPY WITH PROPOFOL;  Surgeon: Jonathon Bellows, MD;  Location: [facility] ENDOSCOPY;  Service: Gastroenterology;  Laterality: N/A;   ENDOBRONCHIAL ULTRASOUND Right 07/30/2018    Procedure: ENDOBRONCHIAL ULTRASOUND;  Surgeon: Laverle Hobby, MD;  Location: [facility] ORS;  Service: Pulmonary;  Laterality: Right;   ESOPHAGOGASTRODUODENOSCOPY (EGD) WITH PROPOFOL N/A 01/07/2017   Procedure: ESOPHAGOGASTRODUODENOSCOPY (EGD) WITH PROPOFOL;  Surgeon: Jonathon Bellows, MD;  Location: [facility] ENDOSCOPY;  Service: Gastroenterology;  Laterality: N/A;   LAPAROSCOPY N/A 03/01/2017   Procedure: LAPAROSCOPY DIAGNOSTIC;  Surgeon: Robert Bellow, MD;  Location: [facility] ORS;  Service: General;  Laterality: N/A;   PORTA CATH INSERTION N/A 08/14/2018   Procedure: PORTA CATH INSERTION;  Surgeon: Algernon Huxley, MD;  Location: [facility] CV LAB;  Service: Cardiovascular;  Laterality: N/A;   TONSILECTOMY, ADENOIDECTOMY, BILATERAL MYRINGOTOMY AND TUBES  1955   TONSILLECTOMY     VENTRAL HERNIA REPAIR N/A 03/01/2017   10 x 14 CM Ventralight ST mesh, intraperitoneal location.    VENTRAL HERNIA REPAIR N/A 03/01/2017   Procedure: HERNIA REPAIR VENTRAL ADULT;  Surgeon: Robert Bellow, MD;  Location: [facility] ORS;  Service: General;  Laterality: N/A;    Social History   Socioeconomic History   Marital status: Married    Spouse name: Johnny   Number of children: Not on file   Years of education: Not on file   Highest education level: Not on file  Occupational History   Not on file  Tobacco Use   Smoking status: Former    Packs/day: 2.00    Years: 50.00    Total pack years: 100.00    Types: Cigarettes, E-cigarettes    Quit date: 02/07/2012    Years since quitting: 9.6   Smokeless tobacco: Never  Vaping Use   Vaping Use: Some days   Start date: 10/06/2013   Devices: uses no liquid  Substance and Sexual Activity   Alcohol use: No    Alcohol/week: 0.0 standard drinks of alcohol   Drug use: No   Sexual activity: Yes  Other Topics Concern   Not on file  Social History Narrative   Married   Retired   Clinical cytogeneticist level of education    No children    1 cup of coffee   Social  Determinants of Health   Financial Resource Strain: Low Risk  (09/08/2020)   Overall Financial Resource Strain (CARDIA)    Difficulty of Paying Living Expenses: Not hard at all  Food Insecurity: No Food Insecurity (09/08/2020)   Hunger Vital Sign    Worried About Running Out of Food in the Last Year: Never true    [place] in the Last Year: Never true  Transportation Needs: No Transportation Needs (09/08/2020)   PRAPARE - Hydrologist (Medical): No    Lack of Transportation (Non-Medical): No  Physical Activity: Unknown (10/29/2018)   Exercise Vital Sign    Days of Exercise per Week: Patient refused    Minutes of Exercise per Session: Patient refused  Stress: No Stress Concern Present (09/08/2020)   [place]    Feeling of Stress : Not at all  Social Connections: Unknown (09/08/2020)   Social Connection and Isolation Panel [NHANES]    Frequency of Communication with Friends and Family: More than three times a week    Frequency of Social Gatherings with  Friends and Family: Not on file    Attends Religious Services: Not on file    Active Member of Clubs or Organizations: Not on file    Attends Club or Organization Meetings: Not on file    Marital Status: Married  Intimate Partner Violence: Not At Risk (09/08/2020)   Humiliation, Afraid, Rape, and Kick questionnaire    Fear of Current or Ex-Partner: No    Emotionally Abused: No    Physically Abused: No    Sexually Abused: No    Family History  Problem Relation Age of Onset   Hypertension Mother    Ovarian cancer Mother 38   Heart disease Father    Stroke Father    Ovarian cancer Sister        ? dx cancer had hyst.   Breast cancer Neg Hx      Current Outpatient Medications:    amLODipine (NORVASC) 5 MG tablet, Take 1 tablet (5 mg total) by mouth daily., Disp: 90 tablet, Rfl: 3   clobetasol cream (TEMOVATE) 8.12 %, Apply 1 application  topically 2 (two) times daily. Lower back, Disp: 60 g, Rfl: 1   folic acid (FOLVITE) 1 MG tablet, Take 1 tablet by mouth once daily, Disp: 90 tablet, Rfl: 0   ibuprofen (ADVIL,MOTRIN) 200 MG tablet, Take 400 mg by mouth every 8 (eight) hours as needed for headache or moderate pain., Disp: , Rfl:    Multiple Vitamins-Minerals (PRESERVISION AREDS 2) CAPS, Take 1 capsule by mouth 2 (two) times a day. , Disp: , Rfl:    pantoprazole (PROTONIX) 40 MG tablet, TAKE 1 TABLET BY MOUTH TWICE DAILY BEFORE A MEAL, Disp: 180 tablet, Rfl: 0   potassium chloride SA (KLOR-CON M) 20 MEQ tablet, Take 1 tablet by mouth once daily, Disp: 14 tablet, Rfl: 0   QUEtiapine (SEROQUEL) 25 MG tablet, Take 0.5 tablets (12.5 mg total) by mouth at bedtime., Disp: 15 tablet, Rfl: 6   rivastigmine (EXELON) 1.5 MG capsule, Take 1 capsule (1.5 mg total) by mouth 2 (two) times daily., Disp: 60 capsule, Rfl: 6   rosuvastatin (CRESTOR) 20 MG tablet, Take 1 tablet by mouth once daily, Disp: 90 tablet, Rfl: 0   sertraline (ZOLOFT) 100 MG tablet, Take 1.5 tablets (150 mg total) by mouth daily., Disp: 135 tablet, Rfl: 1   buPROPion (WELLBUTRIN XL) 300 MG 24 hr tablet, Take 1 tablet by mouth once daily (Patient not taking: Reported on 09/27/2021), Disp: 90 tablet, Rfl: 3   fluticasone (FLONASE) 50 MCG/ACT nasal spray, Place 2 sprays into both nostrils daily. (Patient not taking: Reported on 09/27/2021), Disp: 16 g, Rfl: 6   levothyroxine (SYNTHROID) 125 MCG tablet, Take 1 tablet (125 mcg total) by mouth daily before breakfast. (Patient not taking: Reported on 09/27/2021), Disp: 30 tablet, Rfl: 3 No current facility-administered medications for this visit.  Facility-Administered Medications Ordered in Other Visits:    denosumab (XGEVA) injection 120 mg, 120 mg, Subcutaneous, Q30 days, Sindy Guadeloupe, MD, 120 mg at 03/05/19 1525  Physical exam:  Vitals:   09/27/21 0906  BP: (!) 156/93  Pulse: 75  Resp: 16  Temp: 98.3 F (36.8 C)  TempSrc:  Oral  Weight: 173 lb 12.8 oz (78.8 kg)  Height: 5\' 7"  (1.702 m)   Physical Exam Cardiovascular:     Rate and Rhythm: Normal rate and regular rhythm.     Heart sounds: Normal heart sounds.  Pulmonary:     Effort: Pulmonary effort is normal.     Breath  sounds: Normal breath sounds.  Abdominal:     General: Bowel sounds are normal.     Palpations: Abdomen is soft.  Skin:    General: Skin is warm and dry.  Neurological:     Mental Status: She is alert.     Comments: Oriented to self and place         Latest Ref Rng & Units 09/27/2021    8:36 AM  CMP  Glucose 70 - 99 mg/dL 93   BUN 8 - 23 mg/dL 18   Creatinine 0.44 - 1.00 mg/dL 1.05   Sodium 135 - 145 mmol/L 138   Potassium 3.5 - 5.1 mmol/L 3.9   Chloride 98 - 111 mmol/L 101   CO2 22 - 32 mmol/L 29   Calcium 8.9 - 10.3 mg/dL 9.2   Total Protein 6.5 - 8.1 g/dL 7.7   Total Bilirubin 0.3 - 1.2 mg/dL 0.6   Alkaline Phos 38 - 126 U/L 91   AST 15 - 41 U/L 28   ALT 0 - 44 U/L 18       Latest Ref Rng & Units 09/27/2021    8:36 AM  CBC  WBC 4.0 - 10.5 K/uL 6.3   Hemoglobin 12.0 - 15.0 g/dL 12.0   Hematocrit 36.0 - 46.0 % 38.2   Platelets 150 - 400 K/uL 181     No images are attached to the encounter.  No results found.   Assessment and plan- Patient is a 71 y.o. 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  Her last treatment was given in May 2023 and following that I had positive treatments given her uncontrolled hypothyroidism. presently her TSH is better but is still elevated at 32 with a mildly decreased T4 level.  GERD she was and by Dr. Caryl Bis  for this and the dose was increased from 1 25   micrograms to 137 mcg.  It is however unclear her if patient has picked up this medicine and and is and plan to third thyroid education.  We will reach out to her pharmacy he is  well.  As such I am planning to restart her Tecentriq at this time given better control of her thyroid functions.  I will see her back in 3 weeks for cycle 48   Visit Diagnosis 1. Small cell lung cancer, right upper lobe ([place])   2. Encounter for antineoplastic immunotherapy      Dr. Randa Evens, MD, MPH [facility] [phone] 09/27/2021 9:13 AM

## 2021-09-27 NOTE — Progress Notes (Signed)
ON PATHWAY REGIMEN - Small Cell Lung  No Change  Continue With Treatment as Ordered.  Original Decision Date/Time: 08/06/2018 12:46     Cycles 1 through 4, every 21 days:     Atezolizumab      Carboplatin      Etoposide    Cycles 5 and beyond, every 21 days:     Atezolizumab   **Always confirm dose/schedule in your pharmacy ordering system**  Patient Characteristics: Newly Diagnosed, Preoperative or Nonsurgical Candidate (Clinical Staging), First Line, Extensive Stage Therapeutic Status: Newly Diagnosed, Preoperative or Nonsurgical Candidate (Clinical Staging) AJCC T Category: cT3 AJCC N Category: cN2 AJCC M Category: cM1c AJCC 8 Stage Grouping: IVB Stage Classification: Extensive Intent of Therapy: Non-Curative / Palliative Intent, Discussed with Patient

## 2021-09-28 ENCOUNTER — Other Ambulatory Visit: Payer: PPO | Admitting: Nurse Practitioner

## 2021-10-12 ENCOUNTER — Other Ambulatory Visit: Payer: PPO | Admitting: Nurse Practitioner

## 2021-10-12 DIAGNOSIS — Z515 Encounter for palliative care: Secondary | ICD-10-CM | POA: Diagnosis not present

## 2021-10-12 DIAGNOSIS — C3411 Malignant neoplasm of upper lobe, right bronchus or lung: Secondary | ICD-10-CM

## 2021-10-12 DIAGNOSIS — R630 Anorexia: Secondary | ICD-10-CM

## 2021-10-13 ENCOUNTER — Encounter: Payer: Self-pay | Admitting: Nurse Practitioner

## 2021-10-13 NOTE — Progress Notes (Signed)
[facility] Consult Note Telephone: [phone]  Fax: [phone]   Date of encounter: 10/13/21 10:05 AM PATIENT NAME: Anne [name] [id number]   [phone] (home)  DOB: 07/07/1950 MRN: [id number] PRIMARY CARE PROVIDER:    Leone Haven, MD,  [address] [place] [address] [phone]  REFERRING PROVIDER:   Leone Haven, MD [address] [place],  [place] [address] [phone]  RESPONSIBLE PARTY:    Contact Information     Name Relation Home Work Mobile   Anne [name] Daughter [phone]     Anne [name], Anne [name] [phone]  [phone]   Anne [name] Daughter   [phone]      Due to the COVID-19 crisis, this visit was done via telemedicine from my office and it was initiated and consent by this patient and or family.  I connected with  Anne [name] OR PROXY on 10/13/21 by a telephone as video not available enabled telemedicine application and verified that I am speaking with the correct person using two identifiers.   I discussed the limitations of evaluation and management by telemedicine. The patient expressed understanding and agreed to proceed. Palliative Care was asked to follow this patient by consultation request of  Leone Haven, MD to address advance care planning and complex medical decision making. This is the initial visit.                          ASSESSMENT AND PLAN / RECOMMENDATIONS:  Symptom Management/Plan: 1. Advance Care Planning;  Ongoing discussions  2. Goals of Care: Goals include to maximize quality of life and symptom management. Our advance care planning conversation included a discussion about:    The value and importance of advance care planning  Exploration of personal, cultural or spiritual beliefs that might influence medical decisions  Exploration of goals of care in the event of a sudden injury or  illness  Identification and preparation of a healthcare agent  Review and updating or creation of an advance directive document.  3. Anorexia; secondary to small cell lung cancer, progressive decline; discussed nutrition, supplements, continue to monitor weights, encourage to eat, snacks. Will continue to follow.  04/26/2021 weight 186 lbs 07/19/2021 weight 172 lbs 09/27/2021 weight 173 lbs BMI 28.74 4. Palliative care encounter; Palliative care encounter; Palliative medicine team will continue to support patient, patient's family, and medical team. Visit consisted of counseling and education dealing with the complex and emotionally intense issues of symptom management and palliative care in the setting of serious and potentially life-threatening illness  Follow up Palliative Care Visit: Palliative care will continue to follow for complex medical decision making, advance care planning, and clarification of goals. Return 8 weeks or prn.  I spent 32 minutes providing this consultation. More than 50% of the time in this consultation was spent in counseling and care coordination. PPS: 50% Chief Complaint: Initial palliative consult for complex medical decision making, address goals, manage ongoing symptoms  HISTORY OF PRESENT ILLNESS:  Anne [name] is a 71 y.o. 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 [name] had last Oncology visit with Dr Janese Banks 09/27/2021 with current weigth 173 lbs. PET scan showed 2 RUL lung nodules, hilar and mediastinal adenopathy and widespread bone  mets. MRI brain negative. [name] completed palliative radiation to spine; Palliative carboplatin, etoposide and Tecentriq started on 08/18/2018. Scans after 4 cycles showed stable disease. She is on maintenance tecentriq with autoimmune  hypothyroidism for which she is on levothyroxine  aside from being compliant but her values fluctuate widely.  Dr Janese Banks restarted her on Tecentriq for better control of thyroid functions. I called [name] for initial pc consult by telephone as video not available to establish care, chronic disease, symptom management. [name] and I talked about purpose of PC. [name] in agreement. We talked about how she has been feeling. Ms Gorelick endorses she is doing great. Reviewed [name] is independent, lives with her boyfriend who is her support person. [name] and I talked about past medical history, ros, currently comfortable, asymptomatic, no pain or shortness of breath. [name] endorses she does what she wants to do. We talked about functional abilities. We talked about medical goals, role pc in poc. We talked about f/u pc visit, [name] is currently stable, in agreement, scheduled. Therapeutic listening, emotional support provided. Questions answered. Contact information provided. Will have PC RN make a visit to confirm medication compliance, review symptom to ensure symptom management, monitoring appetite, weights.   She has a prior history of 3 packs/day day smoking for over 45 years and quit smoking 5 years ago.  History obtained from review of EMR, discussion with [name].  I reviewed available labs, medications, imaging, studies and related documents from the EMR.  Records reviewed and summarized above.   ROS 10 point system reviewed all negative except HPI  Physical Exam: Deferred  CURRENT PROBLEM LIST:  Patient Active Problem List   Diagnosis Date Noted   Seborrheic keratosis 09/20/2021   Closed fracture of nasal bones 07/26/2021   Fall 07/26/2021   Head injury 07/07/2021   Left hip pain 07/07/2021   Lipoma 07/07/2021   Slurred speech 06/07/2021   Hematoma 01/23/2021   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   Aortic atherosclerosis ([facility]) 08/24/2020   Dementia  ([facility]) 03/14/2020   Left shoulder pain 07/22/2019   Hypothyroidism 02/16/2019   Goals of care, counseling/discussion 08/06/2018   Small cell lung cancer, right upper lobe ([facility]) 08/06/2018   Malignant neoplasm metastatic to bone ([facility]) 08/06/2018   Skin tear of forearm without complication, initial encounter 12/06/2017   Bruising 12/06/2017   Overweight 08/05/2017   Anemia 01/23/2017   Cirrhosis of liver ([facility]) 01/23/2017   Gastritis 01/23/2017   Hypokalemia 01/03/2017   Heart murmur 07/27/2016   Low back pain 08/15/2015   Benign essential HTN 02/22/2015   HLD (hyperlipidemia) 02/22/2015   Depression 02/22/2015   PAST MEDICAL HISTORY:  Active Ambulatory Problems    Diagnosis Date Noted   Benign essential HTN 02/22/2015   HLD (hyperlipidemia) 02/22/2015   Depression 02/22/2015   Low back pain 08/15/2015   Heart murmur 07/27/2016   Hypokalemia 01/03/2017   Anemia 01/23/2017   Cirrhosis of liver ([facility]) 01/23/2017   Gastritis 01/23/2017   Overweight 08/05/2017   Skin tear of forearm without complication, initial encounter 12/06/2017   Bruising 12/06/2017   Goals of care, counseling/discussion 08/06/2018   Small cell lung cancer, right upper lobe ([facility]) 08/06/2018   Malignant neoplasm metastatic to bone ([facility]) 08/06/2018   Hypothyroidism 02/16/2019   Left shoulder pain 07/22/2019   Dementia ([facility]) 03/14/2020   Aortic atherosclerosis ([facility]) 08/24/2020   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   Hematoma 01/23/2021   Slurred speech 06/07/2021   Head injury  07/07/2021   Left hip pain 07/07/2021   Lipoma 07/07/2021   Closed fracture of nasal bones 07/26/2021   Fall 07/26/2021   Seborrheic keratosis 09/20/2021   Resolved Ambulatory Problems    Diagnosis Date Noted   Encounter to establish care 02/22/2015   Screening for breast cancer 02/22/2015   Screening for osteoporosis 02/22/2015   Right shoulder pain 06/14/2015   Vomiting and diarrhea 10/07/2015   Ischial pain, left  11/18/2015   Bronchitis 11/25/2015   Urge incontinence of urine 02/21/2016   Viral gastroenteritis 03/09/2016   Right calf pain 04/20/2016   Callus of foot 07/27/2016   Weight loss 12/13/2016   Chest pain 01/03/2017   Dysphasia    Upper respiratory infection 01/23/2017   Abdominal wall hernia 02/15/2017   Lung nodule < 6cm on CT 02/15/2017   Ventral hernia 03/01/2017   Bleeding nose 02/26/2018   Vision changes 02/26/2018   Sepsis ([facility]) 10/29/2018   Ear fullness 04/26/2020   Past Medical History:  Diagnosis Date   Arthritis    Cirrhosis ([facility])    GERD (gastroesophageal reflux disease)    Hyperlipidemia    Hypertension    Lung cancer ([facility])    Metastatic bone cancer    SOCIAL HX:  Social History   Tobacco Use   Smoking status: Former    Packs/day: 2.00    Years: 50.00    Total pack years: 100.00    Types: Cigarettes, E-cigarettes    Quit date: 02/07/2012    Years since quitting: 9.6   Smokeless tobacco: Never  Substance Use Topics   Alcohol use: No    Alcohol/week: 0.0 standard drinks of alcohol   FAMILY HX:  Family History  Problem Relation Age of Onset   Hypertension Mother    Ovarian cancer Mother 15   Heart disease Father    Stroke Father    Ovarian cancer Sister        ? dx cancer had hyst.   Breast cancer Neg Hx       ALLERGIES:  Allergies  Allergen Reactions   Bee Venom Swelling     PERTINENT MEDICATIONS:  Outpatient Encounter Medications as of 10/12/2021  Medication Sig   amLODipine (NORVASC) 5 MG tablet Take 1 tablet (5 mg total) by mouth daily.   buPROPion (WELLBUTRIN XL) 300 MG 24 hr tablet Take 1 tablet by mouth once daily (Patient not taking: Reported on 09/27/2021)   clobetasol cream (TEMOVATE) 0.16 % Apply 1 application topically 2 (two) times daily. Lower back   fluticasone (FLONASE) 50 MCG/ACT nasal spray Place 2 sprays into both nostrils daily. (Patient not taking: Reported on 0/11/9321)   folic acid (FOLVITE) 1 MG tablet Take 1 tablet by  mouth once daily   ibuprofen (ADVIL,MOTRIN) 200 MG tablet Take 400 mg by mouth every 8 (eight) hours as needed for headache or moderate pain.   levothyroxine (SYNTHROID) 125 MCG tablet Take 1 tablet (125 mcg total) by mouth daily before breakfast. (Patient not taking: Reported on 09/27/2021)   Multiple Vitamins-Minerals (PRESERVISION AREDS 2) CAPS Take 1 capsule by mouth 2 (two) times a day.    pantoprazole (PROTONIX) 40 MG tablet TAKE 1 TABLET BY MOUTH TWICE DAILY BEFORE A MEAL   potassium chloride SA (KLOR-CON M) 20 MEQ tablet Take 1 tablet by mouth once daily   QUEtiapine (SEROQUEL) 25 MG tablet Take 0.5 tablets (12.5 mg total) by mouth at bedtime.   rivastigmine (EXELON) 1.5 MG capsule Take 1 capsule (1.5 mg total) by  mouth 2 (two) times daily.   rosuvastatin (CRESTOR) 20 MG tablet Take 1 tablet by mouth once daily   sertraline (ZOLOFT) 100 MG tablet Take 1.5 tablets (150 mg total) by mouth daily.   Facility-Administered Encounter Medications as of 10/12/2021  Medication   denosumab (XGEVA) injection 120 mg   Thank you for the opportunity to participate in the care of Ms. Betton.  The palliative care team will continue to follow. Please call our office at [phone] if we can be of additional assistance.   Ibn Stief Z Caytlyn Evers, NP ,

## 2021-10-18 ENCOUNTER — Inpatient Hospital Stay: Payer: PPO

## 2021-10-18 ENCOUNTER — Encounter: Payer: Self-pay | Admitting: Oncology

## 2021-10-18 ENCOUNTER — Inpatient Hospital Stay: Payer: PPO | Attending: Oncology | Admitting: Oncology

## 2021-10-18 VITALS — BP 121/95 | HR 72 | Temp 97.9°F | Resp 16 | Ht 67.0 in | Wt 174.0 lb

## 2021-10-18 DIAGNOSIS — K7460 Unspecified cirrhosis of liver: Secondary | ICD-10-CM | POA: Insufficient documentation

## 2021-10-18 DIAGNOSIS — Z8041 Family history of malignant neoplasm of ovary: Secondary | ICD-10-CM | POA: Diagnosis not present

## 2021-10-18 DIAGNOSIS — Z5112 Encounter for antineoplastic immunotherapy: Secondary | ICD-10-CM | POA: Diagnosis not present

## 2021-10-18 DIAGNOSIS — C7951 Secondary malignant neoplasm of bone: Secondary | ICD-10-CM | POA: Diagnosis not present

## 2021-10-18 DIAGNOSIS — F03918 Unspecified dementia, unspecified severity, with other behavioral disturbance: Secondary | ICD-10-CM | POA: Insufficient documentation

## 2021-10-18 DIAGNOSIS — F17290 Nicotine dependence, other tobacco product, uncomplicated: Secondary | ICD-10-CM | POA: Diagnosis not present

## 2021-10-18 DIAGNOSIS — Z79899 Other long term (current) drug therapy: Secondary | ICD-10-CM | POA: Insufficient documentation

## 2021-10-18 DIAGNOSIS — C3411 Malignant neoplasm of upper lobe, right bronchus or lung: Secondary | ICD-10-CM | POA: Insufficient documentation

## 2021-10-18 DIAGNOSIS — E063 Autoimmune thyroiditis: Secondary | ICD-10-CM | POA: Insufficient documentation

## 2021-10-18 DIAGNOSIS — Z79890 Hormone replacement therapy (postmenopausal): Secondary | ICD-10-CM | POA: Diagnosis not present

## 2021-10-18 LAB — CBC WITH DIFFERENTIAL/PLATELET
Abs Immature Granulocytes: 0.02 10*3/uL (ref 0.00–0.07)
Basophils Absolute: 0 10*3/uL (ref 0.0–0.1)
Basophils Relative: 1 %
Eosinophils Absolute: 0.5 10*3/uL (ref 0.0–0.5)
Eosinophils Relative: 9 %
HCT: 37.3 % (ref 36.0–46.0)
Hemoglobin: 11.8 g/dL — ABNORMAL LOW (ref 12.0–15.0)
Immature Granulocytes: 0 %
Lymphocytes Relative: 18 %
Lymphs Abs: 1 10*3/uL (ref 0.7–4.0)
MCH: 29 pg (ref 26.0–34.0)
MCHC: 31.6 g/dL (ref 30.0–36.0)
MCV: 91.6 fL (ref 80.0–100.0)
Monocytes Absolute: 0.3 10*3/uL (ref 0.1–1.0)
Monocytes Relative: 6 %
Neutro Abs: 3.5 10*3/uL (ref 1.7–7.7)
Neutrophils Relative %: 66 %
Platelets: 167 10*3/uL (ref 150–400)
RBC: 4.07 MIL/uL (ref 3.87–5.11)
RDW: 15.5 % (ref 11.5–15.5)
WBC: 5.4 10*3/uL (ref 4.0–10.5)
nRBC: 0 % (ref 0.0–0.2)

## 2021-10-18 LAB — COMPREHENSIVE METABOLIC PANEL
ALT: 18 U/L (ref 0–44)
AST: 29 U/L (ref 15–41)
Albumin: 3.9 g/dL (ref 3.5–5.0)
Alkaline Phosphatase: 95 U/L (ref 38–126)
Anion gap: 5 (ref 5–15)
BUN: 18 mg/dL (ref 8–23)
CO2: 30 mmol/L (ref 22–32)
Calcium: 9.1 mg/dL (ref 8.9–10.3)
Chloride: 105 mmol/L (ref 98–111)
Creatinine, Ser: 1.04 mg/dL — ABNORMAL HIGH (ref 0.44–1.00)
GFR, Estimated: 57 mL/min — ABNORMAL LOW (ref >60)
Glucose, Bld: 92 mg/dL (ref 70–99)
Potassium: 3.6 mmol/L (ref 3.5–5.1)
Sodium: 140 mmol/L (ref 135–145)
Total Bilirubin: 0.5 mg/dL (ref 0.3–1.2)
Total Protein: 7.4 g/dL (ref 6.5–8.1)

## 2021-10-18 LAB — TSH: TSH: 37.095 u[IU]/mL — ABNORMAL HIGH (ref 0.350–4.500)

## 2021-10-18 MED ORDER — SODIUM CHLORIDE 0.9 % IV SOLN
1200.0000 mg | Freq: Once | INTRAVENOUS | Status: AC
  Administered 2021-10-18: 1200 mg via INTRAVENOUS
  Filled 2021-10-18: qty 20

## 2021-10-18 MED ORDER — HEPARIN SOD (PORK) LOCK FLUSH 100 UNIT/ML IV SOLN
500.0000 [IU] | Freq: Once | INTRAVENOUS | Status: DC | PRN
  Filled 2021-10-18: qty 5

## 2021-10-18 MED ORDER — SODIUM CHLORIDE 0.9 % IV SOLN
Freq: Once | INTRAVENOUS | Status: AC
  Filled 2021-10-18: qty 250

## 2021-10-18 MED ORDER — HEPARIN SOD (PORK) LOCK FLUSH 100 UNIT/ML IV SOLN
INTRAVENOUS | Status: AC
  Administered 2021-10-18: 500 [IU]
  Filled 2021-10-18: qty 5

## 2021-10-18 MED ORDER — SODIUM CHLORIDE 0.9% FLUSH
10.0000 mL | INTRAVENOUS | Status: DC | PRN
  Administered 2021-10-18: 10 mL via INTRAVENOUS
  Filled 2021-10-18: qty 10

## 2021-10-18 NOTE — Patient Instructions (Signed)
[facility] AT [place]  Discharge Instructions: Thank you for choosing [facility] to provide your oncology and hematology care.  If you have a lab appointment with the [facility], please go directly to the [facility] and check in at the registration area.  Wear comfortable clothing and clothing appropriate for easy access to any Portacath or PICC line.   We strive to give you quality time with your provider. You may need to reschedule your appointment if you arrive late (15 or more minutes).  Arriving late affects you and other patients whose appointments are after yours.  Also, if you miss three or more appointments without notifying the office, you may be dismissed from the clinic at the provider's discretion.      For prescription refill requests, have your pharmacy contact our office and allow 72 hours for refills to be completed.    Today you received the following chemotherapy and/or immunotherapy agents Tecentriq        To help prevent nausea and vomiting after your treatment, we encourage you to take your nausea medication as directed.  BELOW ARE SYMPTOMS THAT SHOULD BE REPORTED IMMEDIATELY: *FEVER GREATER THAN 100.4 F (38 C) OR HIGHER *CHILLS OR SWEATING *NAUSEA AND VOMITING THAT IS NOT CONTROLLED WITH YOUR NAUSEA MEDICATION *UNUSUAL SHORTNESS OF BREATH *UNUSUAL BRUISING OR BLEEDING *URINARY PROBLEMS (pain or burning when urinating, or frequent urination) *BOWEL PROBLEMS (unusual diarrhea, constipation, pain near the anus) TENDERNESS IN MOUTH AND THROAT WITH OR WITHOUT PRESENCE OF ULCERS (sore throat, sores in mouth, or a toothache) UNUSUAL RASH, SWELLING OR PAIN  UNUSUAL VAGINAL DISCHARGE OR ITCHING   Items with * indicate a potential emergency and should be followed up as soon as possible or go to the Emergency Department if any problems should occur.  Please show the CHEMOTHERAPY ALERT CARD or IMMUNOTHERAPY ALERT CARD at check-in  to the Emergency Department and triage nurse.  Should you have questions after your visit or need to cancel or reschedule your appointment, please contact [facility] CANC[facility] [place] AT [place]  [phone] and follow the prompts.  Office hours are 8:00 a.m. to 4:30 p.m. Monday - Friday. Please note that voicemails left after 4:00 p.m. may not be returned until the following business day.  We are closed weekends and major holidays. You have access to a nurse at all times for urgent questions. Please call the main number to the clinic [phone] and follow the prompts.  For any non-urgent questions, you may also contact your provider using MyChart. We now offer e-Visits for anyone 50 and older to request care online for non-urgent symptoms. For details visit [email or web address].   Also download the MyChart app! Go to the app store, search "MyChart", open the app, select [facility], and log in with your MyChart username and password.  Masks are optional in the cancer centers. If you would like for your care team to wear a mask while they are taking care of you, please let them know. For doctor visits, patients may have with them one support person who is at least 73 years old. At this time, visitors are not allowed in the infusion area.

## 2021-10-18 NOTE — Progress Notes (Signed)
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  She has been referred to [place] for findings of bone metastases and her recent MRI.  She has a prior history of 3 packs/day day smoking for over 45 years and quit smoking 5 years ago.She had a CT chest abdomen pelvis in 2018 which showed a 5 mm lung nodule in the left lower lobe.  Recently over the last 2 months patient has been having worsening back pain and was referred to orthopedics.  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  Pathologic fracture of S1.   PET scan showed 2 RUL lung nodules, hilar and mediastinal adenopathy and widespread bone mets. MRI brain negative.   Patient completed palliative RT to her spine. Bronchoscopy showed small cell lung cancer.  Palliative carboplatin, etoposide and Tecentriq started on 08/18/2018. Scans after 4 cycles showed stable disease. 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  She reports being compliant but her  values fluctuate widely.      Interval history-tolerating Tecentriq well and denies any specific complaints at this time.  Patient states that she has been taking her own medications and it is unclear if she is taking levothyroxine consistently  ECOG PS- 1 Pain scale- 0   Review of systems- Review of Systems  Constitutional:  Negative for chills, fever, malaise/fatigue and weight loss.  HENT:  Negative for congestion, ear discharge and nosebleeds.   Eyes:  Negative for blurred vision.  Respiratory:  Negative for cough, hemoptysis, sputum production, shortness of breath and wheezing.   Cardiovascular:  Negative for chest pain, palpitations, orthopnea and claudication.  Gastrointestinal:  Negative for abdominal pain, blood in stool, constipation, diarrhea, heartburn, melena, nausea and vomiting.  Genitourinary:  Negative for dysuria, flank pain, frequency, hematuria and urgency.  Musculoskeletal:  Negative for back pain, joint pain and myalgias.  Skin:  Negative for rash.  Neurological:  Negative for dizziness, tingling, focal weakness, seizures, weakness and headaches.  Endo/Heme/Allergies:  Does not bruise/bleed easily.  Psychiatric/Behavioral:  Negative for depression and suicidal ideas. The patient does not have insomnia.       Allergies  Allergen Reactions   Bee Venom Swelling     Past Medical History:  Diagnosis Date   Arthritis    Cirrhosis ([place])    Dementia ([place])    Depression    GERD (gastroesophageal reflux disease)    Hyperlipidemia    Hypertension    Lung cancer ([place])    Metastatic bone cancer      Past Surgical History:  Procedure Laterality Date   APPENDECTOMY  1971   CHOLECYSTECTOMY  1971   COLONOSCOPY WITH PROPOFOL N/A 11/15/2016   Procedure: COLONOSCOPY WITH PROPOFOL;  Surgeon: Jonathon Bellows, MD;  Location: [facility] ENDOSCOPY;  Service: Gastroenterology;  Laterality: N/A;   ENDOBRONCHIAL ULTRASOUND Right 07/30/2018   Procedure: ENDOBRONCHIAL ULTRASOUND;   Surgeon: Laverle Hobby, MD;  Location: [facility] ORS;  Service: Pulmonary;  Laterality: Right;   ESOPHAGOGASTRODUODENOSCOPY (EGD) WITH PROPOFOL N/A 01/07/2017   Procedure: ESOPHAGOGASTRODUODENOSCOPY (EGD) WITH PROPOFOL;  Surgeon: Jonathon Bellows, MD;  Location[facility] ENDOSCOPY;  Service: Gastroenterology;  Laterality: N/A;   LAPAROSCOPY N/A 03/01/2017   Procedure: LAPAROSCOPY DIAGNOSTIC;  Surgeon: Robert Bellow, MD;  Location: [facility] ORS;  Service: General;  Laterality: N/A;   PORTA CATH INSERTION N/A 08/14/2018   Procedure: PORTA CATH INSERTION;  Surgeon: Algernon Huxley, MD;  Location: [place] CV LAB;  Service: Cardiovascular;  Laterality: N/A;   TONSILECTOMY, ADENOIDECTOMY, BILATERAL MYRINGOTOMY AND TUBES  1955   TONSILLECTOMY     VENTRAL HERNIA REPAIR N/A 03/01/2017   10 x 14 CM Ventralight ST mesh, intraperitoneal location.    VENTRAL HERNIA REPAIR N/A 03/01/2017   Procedure: HERNIA REPAIR VENTRAL ADULT;  Surgeon: Robert Bellow, MD;  Location: [facility] ORS;  Service: General;  Laterality: N/A;    Social History   Socioeconomic History   Marital status: Married    Spouse name: Johnny   Number of children: Not on file   Years of education: Not on file   Highest education level: Not on file  Occupational History   Not on file  Tobacco Use   Smoking status: Former    Packs/day: 2.00    Years: 50.00    Total pack years: 100.00    Types: Cigarettes, E-cigarettes    Quit date: 02/07/2012    Years since quitting: 9.7   Smokeless tobacco: Never  Vaping Use   Vaping Use: Former   Start date: 10/06/2013   Devices: uses no liquid  Substance and Sexual Activity   Alcohol use: No    Alcohol/week: 0.0 standard drinks of alcohol   Drug use: No   Sexual activity: Yes  Other Topics Concern   Not on file  Social History Narrative   Married   Retired   Clinical cytogeneticist level of education    No children    1 cup of coffee   Social Determinants of Health   Financial Resource  Strain: Low Risk  (09/08/2020)   Overall Financial Resource Strain (CARDIA)    Difficulty of Paying Living Expenses: Not hard at all  Food Insecurity: No Food Insecurity (09/08/2020)   Hunger Vital Sign    Worried About Running Out of Food in the Last Year: Never true    [place] in the Last Year: Never true  Transportation Needs: No Transportation Needs (09/08/2020)   PRAPARE - Hydrologist (Medical): No    Lack of Transportation (Non-Medical): No  Physical Activity: Unknown (10/29/2018)   Exercise Vital Sign    Days of Exercise per Week: Patient refused    Minutes of Exercise per Session: Patient refused  Stress: No Stress Concern Present (09/08/2020)   [place]    Feeling of Stress : Not at all  Social Connections: Unknown (09/08/2020)   Social Connection and Isolation Panel [NHANES]    Frequency of Communication with Friends and Family: More than three times a week    Frequency of Social Gatherings with Friends and Family: Not on file  Attends Religious Services: Not on file    Active Member of Clubs or Organizations: Not on file    Attends Club or Organization Meetings: Not on file    Marital Status: Married  Intimate Partner Violence: Not At Risk (09/08/2020)   Humiliation, Afraid, Rape, and Kick questionnaire    Fear of Current or Ex-Partner: No    Emotionally Abused: No    Physically Abused: No    Sexually Abused: No    Family History  Problem Relation Age of Onset   Hypertension Mother    Ovarian cancer Mother 39   Heart disease Father    Stroke Father    Ovarian cancer Sister        ? dx cancer had hyst.   Breast cancer Neg Hx      Current Outpatient Medications:    amLODipine (NORVASC) 5 MG tablet, Take 1 tablet (5 mg total) by mouth daily., Disp: 90 tablet, Rfl: 3   buPROPion (WELLBUTRIN XL) 300 MG 24 hr tablet, Take 1 tablet by mouth once daily, Disp: 90 tablet,  Rfl: 3   clobetasol cream (TEMOVATE) 8.41 %, Apply 1 application topically 2 (two) times daily. Lower back, Disp: 60 g, Rfl: 1   folic acid (FOLVITE) 1 MG tablet, Take 1 tablet by mouth once daily, Disp: 90 tablet, Rfl: 0   ibuprofen (ADVIL,MOTRIN) 200 MG tablet, Take 400 mg by mouth every 8 (eight) hours as needed for headache or moderate pain., Disp: , Rfl:    Multiple Vitamins-Minerals (PRESERVISION AREDS 2) CAPS, Take 1 capsule by mouth 2 (two) times a day. , Disp: , Rfl:    fluticasone (FLONASE) 50 MCG/ACT nasal spray, Place 2 sprays into both nostrils daily. (Patient not taking: Reported on 09/27/2021), Disp: 16 g, Rfl: 6   levothyroxine (SYNTHROID) 125 MCG tablet, Take 1 tablet (125 mcg total) by mouth daily before breakfast. (Patient not taking: Reported on 09/27/2021), Disp: 30 tablet, Rfl: 3   pantoprazole (PROTONIX) 40 MG tablet, TAKE 1 TABLET BY MOUTH TWICE DAILY BEFORE A MEAL (Patient not taking: Reported on 10/18/2021), Disp: 180 tablet, Rfl: 0   potassium chloride SA (KLOR-CON M) 20 MEQ tablet, Take 1 tablet by mouth once daily (Patient not taking: Reported on 10/18/2021), Disp: 14 tablet, Rfl: 0   QUEtiapine (SEROQUEL) 25 MG tablet, Take 0.5 tablets (12.5 mg total) by mouth at bedtime. (Patient not taking: Reported on 10/18/2021), Disp: 15 tablet, Rfl: 6   rivastigmine (EXELON) 1.5 MG capsule, Take 1 capsule (1.5 mg total) by mouth 2 (two) times daily. (Patient not taking: Reported on 10/18/2021), Disp: 60 capsule, Rfl: 6   rosuvastatin (CRESTOR) 20 MG tablet, Take 1 tablet by mouth once daily (Patient not taking: Reported on 10/18/2021), Disp: 90 tablet, Rfl: 0   sertraline (ZOLOFT) 100 MG tablet, Take 1.5 tablets (150 mg total) by mouth daily. (Patient not taking: Reported on 10/18/2021), Disp: 135 tablet, Rfl: 1 No current facility-administered medications for this visit.  Facility-Administered Medications Ordered in Other Visits:    denosumab (XGEVA) injection 120 mg, 120 mg,  Subcutaneous, Q30 days, Sindy Guadeloupe, MD, 120 mg at 03/05/19 1525   heparin lock flush 100 unit/mL, 500 Units, Intracatheter, Once PRN, Sindy Guadeloupe, MD  Physical exam:  Vitals:   10/18/21 0936  BP: (!) 121/95  Pulse: 72  Resp: 16  Temp: 97.9 F (36.6 C)  TempSrc: Oral  Weight: 174 lb (78.9 kg)  Height: 5\' 7"  (1.702 m)   Physical Exam Constitutional:  General: She is not in acute distress. Cardiovascular:     Rate and Rhythm: Normal rate and regular rhythm.     Heart sounds: Normal heart sounds.  Pulmonary:     Effort: Pulmonary effort is normal.     Breath sounds: Normal breath sounds.  Abdominal:     General: Bowel sounds are normal.     Palpations: Abdomen is soft.  Skin:    General: Skin is warm and dry.  Neurological:     Mental Status: She is alert and oriented to person, place, and time.         Latest Ref Rng & Units 10/18/2021    8:15 AM  CMP  Glucose 70 - 99 mg/dL 92   BUN 8 - 23 mg/dL 18   Creatinine 0.44 - 1.00 mg/dL 1.04   Sodium 135 - 145 mmol/L 140   Potassium 3.5 - 5.1 mmol/L 3.6   Chloride 98 - 111 mmol/L 105   CO2 22 - 32 mmol/L 30   Calcium 8.9 - 10.3 mg/dL 9.1   Total Protein 6.5 - 8.1 g/dL 7.4   Total Bilirubin 0.3 - 1.2 mg/dL 0.5   Alkaline Phos 38 - 126 U/L 95   AST 15 - 41 U/L 29   ALT 0 - 44 U/L 18       Latest Ref Rng & Units 10/18/2021    8:15 AM  CBC  WBC 4.0 - 10.5 K/uL 5.4   Hemoglobin 12.0 - 15.0 g/dL 11.8   Hematocrit 36.0 - 46.0 % 37.3   Platelets 150 - 400 K/uL 167      Assessment and plan- Patient is a 71 y.o. 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  For cycle 50.  Autoimmune hypothyroidism: TSH remains elevated at 37.  I have asked her to bring her medications next time with her so we can highlight the bottle that  she should not be missing to take daily.  Scans to be done in 2 months   Visit Diagnosis 1. Encounter for antineoplastic immunotherapy   2. Small cell lung cancer, right upper lobe ([place])   3. Autoimmune hypothyroidism      Dr. Randa Evens, MD, MPH [facility] [phone] 10/18/2021 12:48 PM

## 2021-10-19 ENCOUNTER — Emergency Department
Admission: EM | Admit: 2021-10-19 | Discharge: 2021-10-19 | Disposition: A | Discharge status: Home / Self Care | Payer: PPO | Attending: Emergency Medicine | Admitting: Emergency Medicine

## 2021-10-19 ENCOUNTER — Emergency Department: Payer: PPO

## 2021-10-19 ENCOUNTER — Telehealth: Payer: Self-pay | Admitting: Internal Medicine

## 2021-10-19 ENCOUNTER — Telehealth: Payer: Self-pay

## 2021-10-19 ENCOUNTER — Other Ambulatory Visit: Payer: Self-pay

## 2021-10-19 DIAGNOSIS — I10 Essential (primary) hypertension: Secondary | ICD-10-CM | POA: Insufficient documentation

## 2021-10-19 DIAGNOSIS — S0011XA Contusion of right eyelid and periocular area, initial encounter: Secondary | ICD-10-CM | POA: Diagnosis not present

## 2021-10-19 DIAGNOSIS — S0003XA Contusion of scalp, initial encounter: Secondary | ICD-10-CM | POA: Diagnosis not present

## 2021-10-19 DIAGNOSIS — W19XXXA Unspecified fall, initial encounter: Secondary | ICD-10-CM | POA: Insufficient documentation

## 2021-10-19 DIAGNOSIS — E039 Hypothyroidism, unspecified: Secondary | ICD-10-CM | POA: Insufficient documentation

## 2021-10-19 DIAGNOSIS — S0990XA Unspecified injury of head, initial encounter: Secondary | ICD-10-CM | POA: Diagnosis not present

## 2021-10-19 DIAGNOSIS — R531 Weakness: Secondary | ICD-10-CM

## 2021-10-19 DIAGNOSIS — Y92019 Unspecified place in single-family (private) house as the place of occurrence of the external cause: Secondary | ICD-10-CM | POA: Diagnosis not present

## 2021-10-19 DIAGNOSIS — S0591XA Unspecified injury of right eye and orbit, initial encounter: Secondary | ICD-10-CM | POA: Diagnosis present

## 2021-10-19 LAB — BASIC METABOLIC PANEL
Anion gap: 7 (ref 5–15)
BUN: 20 mg/dL (ref 8–23)
CO2: 30 mmol/L (ref 22–32)
Calcium: 9.1 mg/dL (ref 8.9–10.3)
Chloride: 103 mmol/L (ref 98–111)
Creatinine, Ser: 1.03 mg/dL — ABNORMAL HIGH (ref 0.44–1.00)
GFR, Estimated: 57 mL/min — ABNORMAL LOW (ref >60)
Glucose, Bld: 89 mg/dL (ref 70–99)
Potassium: 3.7 mmol/L (ref 3.5–5.1)
Sodium: 140 mmol/L (ref 135–145)

## 2021-10-19 LAB — CBC
HCT: 36.5 % (ref 36.0–46.0)
Hemoglobin: 11.2 g/dL — ABNORMAL LOW (ref 12.0–15.0)
MCH: 27.9 pg (ref 26.0–34.0)
MCHC: 30.7 g/dL (ref 30.0–36.0)
MCV: 91 fL (ref 80.0–100.0)
Platelets: 165 10*3/uL (ref 150–400)
RBC: 4.01 MIL/uL (ref 3.87–5.11)
RDW: 15.6 % — ABNORMAL HIGH (ref 11.5–15.5)
WBC: 7.3 10*3/uL (ref 4.0–10.5)
nRBC: 0 % (ref 0.0–0.2)

## 2021-10-19 LAB — T4: T4, Total: 4.6 ug/dL (ref 4.5–12.0)

## 2021-10-19 NOTE — ED Notes (Signed)
Pt with hx of dementia, unable to state how she fell, no LOC per family. Pt with bruising around right eye from fall yesterday per family and ping pong ball sized knot on back of right head from fall today. No bleeding noted. Right knee swollen and healing scab on right elbow from other falls. Pt with bruising on mid back. NAD at this time.

## 2021-10-19 NOTE — ED Triage Notes (Signed)
Pt has had 3 falls in the last 3 days at home where her legs give out- pt has a swollen R knee, bruising around her R eye, and a knot on the back of her head- pt states she also does not remember falling

## 2021-10-19 NOTE — Telephone Encounter (Signed)
Agree Dr. Olivia Mackie McLean-Scocuzza  Rec f/u with PCP and consider PT/OT home for 3 falls  Sch pt appt with PCP sent to ED for imaging of brain right eye bruised after 3 falls

## 2021-10-19 NOTE — Telephone Encounter (Addendum)
Pt arrived in ofc accompanied by his husband as a walk in. Pt was brought back to NV room for triaging & obtained hx by both pt & husband. Husband stated that pt has fallen in the past 3 days w/most recent fall being last night. Pt states she fell due to floor being wet, husband states he did not see fall he just heard a loud sound & found pt on the bathroom floor. Pt arrives with brusises in her elbows,legs,knees & has a R black eye which occurred from the fall per husband. Pt also c/o having a knot in the back of her head, pt denies any dizziness, HA, blurry vision from eye at this time.   Per Dr.Tracy pt is in need of higher level of care at the emergency dept b/c she needs a CAT scan of her brain to ensure there is no trauma from her recent falls which they are able do perform it at a faster pace than [place] since we don't have the equipment here at our ofc. Pt & husband verbalized understanding to info & husband will transport wife to the ED so that pt can receive the care that she needs.  Vitals were obtained as follows:  L arm:120/70 Temp:98.1 PR:86 O2:93

## 2021-10-19 NOTE — ED Provider Notes (Signed)
[facility] Provider Note   Event Date/Time   First MD Initiated Contact with Patient 10/19/21 [phone]     (approximate) History  Fall  HPI Anne [name] is a 71 y.o. female with a past medical history of hypertension, hyperlipidemia, cirrhosis, and hypothyroidism who presents for 3 falls over the last 3 days in which she states "my legs just gave out".  Patient has swelling to the right knee as well as bruising around her right eye and a knot to the back of her head.  Patient states that she did not lose consciousness and believes that this is secondary to known arthritis that she has in both knees and that the symptoms occur randomly.  Patient denies any exacerbating or relieving factors. ROS: Patient currently denies any vision changes, tinnitus, difficulty speaking, facial droop, sore throat, chest pain, shortness of breath, abdominal pain, nausea/vomiting/diarrhea, dysuria, or numbness/paresthesias in any extremity   Physical Exam  Triage Vital Signs: ED Triage Vitals [10/19/21 1004]  Enc Vitals Group     BP (!) 148/85     Pulse Rate 81     Resp 18     Temp      Temp src      SpO2 94 %     Weight 200 lb (90.7 kg)     Height 5\' 5"  (1.651 m)     Head Circumference      Peak Flow      Pain Score 0     Pain Loc      Pain Edu?      Excl. in [place]?    Most recent vital signs: Vitals:   10/19/21 1053 10/19/21 1220  BP: 128/67 (!) 144/81  Pulse: 73 69  Resp: 14 18  Temp:  98 F (36.7 C)  SpO2: 95% 100%   General: Awake, oriented x4. CV:  Good peripheral perfusion.  Resp:  Normal effort.  Abd:  No distention.  Other:  Elderly Caucasian female laying in bed in no acute distress.  Periorbital ecchymosis appreciated to the right eye as well as swelling over the right knee and mild tenderness to palpation however full range of motion without significant pain ED Results / Procedures / Treatments  Labs (all labs ordered are listed, but only abnormal results  are displayed) Labs Reviewed  BASIC METABOLIC PANEL - Abnormal; Notable for the following components:      Result Value   Creatinine, Ser 1.03 (*)    GFR, Estimated 57 (*)    All other components within normal limits  CBC - Abnormal; Notable for the following components:   Hemoglobin 11.2 (*)    RDW 15.6 (*)    All other components within normal limits   EKG ED ECG REPORT I, Naaman Plummer, the attending physician, personally viewed and interpreted this ECG. Date: 10/19/2021 EKG Time: 1011 Rate: 82 Rhythm: normal sinus rhythm QRS Axis: normal Intervals: normal ST/T Wave abnormalities: normal Narrative Interpretation: no evidence of acute ischemia RADIOLOGY ED MD interpretation: CT of the head without contrast interpreted by me and shows no acute intracranial abnormalities however there is a right occipital scalp hematoma without evidence of calvarial fracture -Agree with radiology assessment Official radiology report(s): CT HEAD WO CONTRAST  Result Date: 10/19/2021 CLINICAL DATA:  Head trauma, minor.  Fall. EXAM: CT HEAD WITHOUT CONTRAST TECHNIQUE: Contiguous axial images were obtained from the base of the skull through the vertex without intravenous contrast. RADIATION DOSE REDUCTION: This exam was performed  according to the departmental dose-optimization program which includes automated exposure control, adjustment of the mA and/or kV according to patient size and/or use of iterative reconstruction technique. COMPARISON:  CT examination dated July 22, 2021. FINDINGS: Brain: No evidence of acute infarction, hemorrhage, hydrocephalus, extra-axial collection or mass lesion/mass effect. Prominence of the ventricles and sulci secondary to mild cerebral volume loss. Mild chronic microvascular ischemic changes of the white matter. Vascular: No hyperdense vessel or unexpected calcification. Skull: Right occipital scalp swelling. Negative for fracture or focal lesion. Sinuses/Orbits: Partial  opacification of the right maxillary sinus. Other: None. IMPRESSION: 1.  No acute intracranial abnormality. 2. Right occipital scalp hematoma without evidence of calvarial fracture. Electronically Signed   By: Keane Police D.O.   On: 10/19/2021 11:14   PROCEDURES: Critical Care performed: No Procedures MEDICATIONS ORDERED IN ED: Medications - No data to display IMPRESSION / MDM / [place] / ED COURSE  I reviewed the triage vital signs and the nursing notes.                             The patient is on the cardiac monitor to evaluate for evidence of arrhythmia and/or significant heart rate changes. Patient's presentation is most consistent with acute presentation with potential threat to life or bodily function. Presenting after a fall that occurred just prior to arrival, resulting in injury to the right eye, occipital scalp, and right knee. The mechanism of injury was a mechanical ground level fall without syncope or near-syncope. The current level of pain is moderate. There was no loss of consciousness, confusion, seizure, or memory impairment. There is not a laceration associated with the injury. Denies neck pain. The patient does not take blood thinner medications. Denies vomiting, numbness/weakness, fever Patient was offered radiography of the right knee however she refused stating "I am walking okay and ready to go home" Dispo: Discharge with PCP follow-up       FINAL CLINICAL IMPRESSION(S) / ED DIAGNOSES   Final diagnoses:  Generalized weakness  Fall, initial encounter  Injury of head, initial encounter  Periorbital ecchymosis of right eye, initial encounter   Rx / DC Orders   ED Discharge Orders     None      Note:  This document was prepared using Dragon voice recognition software and may include unintentional dictation errors.   Naaman Plummer, MD 10/19/21 [phone]

## 2021-10-19 NOTE — ED Notes (Addendum)
Pt denies SOB, O2 sats varying with pt activity, sats dropping to 88% on room air after walking, O2 3 L [place] placed, sats increasing to 94% on 3 L [place]. No SOB noted, pt states she does not wear oxygen at home.

## 2021-10-19 NOTE — ED Notes (Signed)
Pt alert, IV removed, pt given discharge instructions, pt assisted to vehicle by RN.

## 2021-10-24 ENCOUNTER — Ambulatory Visit (INDEPENDENT_AMBULATORY_CARE_PROVIDER_SITE_OTHER): Payer: PPO | Admitting: Family Medicine

## 2021-10-24 ENCOUNTER — Encounter: Payer: Self-pay | Admitting: Family Medicine

## 2021-10-24 VITALS — BP 130/80 | HR 86 | Temp 98.6°F | Ht 67.0 in | Wt 173.8 lb

## 2021-10-24 DIAGNOSIS — S40011A Contusion of right shoulder, initial encounter: Secondary | ICD-10-CM | POA: Diagnosis not present

## 2021-10-24 DIAGNOSIS — W19XXXA Unspecified fall, initial encounter: Secondary | ICD-10-CM

## 2021-10-24 DIAGNOSIS — S0011XA Contusion of right eyelid and periocular area, initial encounter: Secondary | ICD-10-CM | POA: Diagnosis not present

## 2021-10-24 NOTE — Progress Notes (Signed)
Tommi Rumps, MD Phone: [phone]  Anne [name] is a 71 y.o. female who presents today for f/u.  Patient is a poor historian given her dementia.  Husband provides some of the history.  Falls: Patient notes several falls this week.  Her husband notes she fell twice at home and then once in a parking lot.  The patient is unsure exactly how she fell or why she fell.  She did not lose consciousness.  She feels as though her balance is okay.  She notes she did not trip on anything.  Per the ED note she reported that her knees gave out on her because of arthritis.  Today her and her husband both report they are not sure how or why she fell.  She notes her knee does still hurt some though she is walking around on it well.  She had a right shoulder injury as well though she has good movement and notes no pain in her shoulder today.  She has bruising around her right eye.  She has a right occipital scalp hematoma.  She had a CT of her head that did not reveal any acute intracranial processes.  Social History   Tobacco Use  Smoking Status Former   Packs/day: 2.00   Years: 50.00   Total pack years: 100.00   Types: Cigarettes, E-cigarettes   Quit date: 02/07/2012   Years since quitting: 9.7  Smokeless Tobacco Never    Current Outpatient Medications on File Prior to Visit  Medication Sig Dispense Refill   amLODipine (NORVASC) 5 MG tablet Take 1 tablet (5 mg total) by mouth daily. 90 tablet 3   buPROPion (WELLBUTRIN XL) 300 MG 24 hr tablet Take 1 tablet by mouth once daily 90 tablet 3   clobetasol cream (TEMOVATE) 3.00 % Apply 1 application topically 2 (two) times daily. Lower back 60 g 1   fluticasone (FLONASE) 50 MCG/ACT nasal spray Place 2 sprays into both nostrils daily. 16 g 6   folic acid (FOLVITE) 1 MG tablet Take 1 tablet by mouth once daily 90 tablet 0   ibuprofen (ADVIL,MOTRIN) 200 MG tablet Take 400 mg by mouth every 8 (eight) hours as needed for headache or moderate pain.      levothyroxine (SYNTHROID) 125 MCG tablet Take 1 tablet (125 mcg total) by mouth daily before breakfast. 30 tablet 3   Multiple Vitamins-Minerals (PRESERVISION AREDS 2) CAPS Take 1 capsule by mouth 2 (two) times a day.      pantoprazole (PROTONIX) 40 MG tablet TAKE 1 TABLET BY MOUTH TWICE DAILY BEFORE A MEAL 180 tablet 0   potassium chloride SA (KLOR-CON M) 20 MEQ tablet Take 1 tablet by mouth once daily 14 tablet 0   QUEtiapine (SEROQUEL) 25 MG tablet Take 0.5 tablets (12.5 mg total) by mouth at bedtime. 15 tablet 6   rivastigmine (EXELON) 1.5 MG capsule Take 1 capsule (1.5 mg total) by mouth 2 (two) times daily. 60 capsule 6   rosuvastatin (CRESTOR) 20 MG tablet Take 1 tablet by mouth once daily 90 tablet 0   sertraline (ZOLOFT) 100 MG tablet Take 1.5 tablets (150 mg total) by mouth daily. 135 tablet 1   Current Facility-Administered Medications on File Prior to Visit  Medication Dose Route Frequency Provider Last Rate Last Admin   denosumab (XGEVA) injection 120 mg  120 mg Subcutaneous Q30 days Sindy Guadeloupe, MD   120 mg at 03/05/19 1525     ROS see history of present illness  Objective  Physical Exam Vitals:   10/24/21 0936 10/24/21 1002  BP: 130/80   Pulse: 86   Temp: 98.6 F (37 C)   SpO2: 91% 97%    BP Readings from Last 3 Encounters:  10/24/21 130/80  10/19/21 (!) 144/81  10/18/21 (!) 121/95   Wt Readings from Last 3 Encounters:  10/24/21 173 lb 12.8 oz (78.8 kg)  10/19/21 200 lb (90.7 kg)  10/18/21 174 lb (78.9 kg)    Physical Exam Constitutional:      General: She is not in acute distress.    Appearance: She is not diaphoretic.  HENT:     Head:     Comments: Bruising around right eye, no bony tenderness around the right orbit or left orbit or nose Cardiovascular:     Rate and Rhythm: Normal rate and regular rhythm.     Heart sounds: Normal heart sounds.  Pulmonary:     Effort: Pulmonary effort is normal.     Breath sounds: Normal breath sounds.   Musculoskeletal:     Comments: Large bruise over posterior right shoulder, right shoulder is nontender, has good range of motion with no pain, right knee with some swelling and soft tissue tenderness, no bony tenderness noted  Skin:    General: Skin is warm and dry.  Neurological:     Mental Status: She is alert.     Comments: CN 3-12 intact, 5/5 strength in bilateral biceps, triceps, grip, quads, hamstrings, plantar and dorsiflexion, sensation to light touch intact in bilateral UE and LE, normal gait, normal finger-nose, negative Romberg, no pronator drift      Assessment/Plan: Please see individual problem list.  Problem List Items Addressed This Visit     Fall - Primary    Patient with several falls last week with injuries as outlined above all.  She had no acute intracranial processes on CT scan.  She has been able to ambulate well on her right knee and thus it is unlikely that she has a fracture there.  She has good range of motion without any pain in her right shoulder making fracture they are unlikely as well.  Discussed the need for physical therapy to help with balance and strengthening her legs to help prevent future falls.  Patient notes she does not have any area rugs at home and only has 1 very large carpet.  Sodium level was fine while in the hospital.  I will place physical therapy referral for home health to see if they can work with the patient on falls preventions.      Relevant Orders   Ambulatory referral to [facility]     Return in about 6 weeks (around 12/05/2021) for Falls.   Tommi Rumps, MD [facility]

## 2021-10-24 NOTE — Patient Instructions (Signed)
Nice to see you. Physical therapy should contact you to come to the home and work with you.  If you have any further falls please let us know.

## 2021-10-24 NOTE — Assessment & Plan Note (Signed)
Patient with several falls last week with injuries as outlined above all.  She had no acute intracranial processes on CT scan.  She has been able to ambulate well on her right knee and thus it is unlikely that she has a fracture there.  She has good range of motion without any pain in her right shoulder making fracture they are unlikely as well.  Discussed the need for physical therapy to help with balance and strengthening her legs to help prevent future falls.  Patient notes she does not have any area rugs at home and only has 1 very large carpet.  Sodium level was fine while in the hospital.  I will place physical therapy referral for home health to see if they can work with the patient on falls preventions.

## 2021-10-25 ENCOUNTER — Telehealth: Payer: Self-pay

## 2021-10-25 ENCOUNTER — Other Ambulatory Visit: Payer: Self-pay | Admitting: Family Medicine

## 2021-10-25 NOTE — Telephone Encounter (Signed)
245 pm.  Follow up call made at the request of Christin Gusler, NP.  No answer on the home phone.  Unable to connect on cell phone.  Will attempt again at a later time.

## 2021-10-26 ENCOUNTER — Telehealth: Payer: Self-pay | Admitting: Family Medicine

## 2021-10-26 NOTE — Telephone Encounter (Signed)
Merleen Nicely returned my call and I gave her the DX code from the ED when the person went to the hospital about the fall she had, they could not take the diagnosis of fall they wanted a diagnoses of why the patient fell and I informed her it was generalized weakness and she stated if that diagnosis did not work she would call and let us know.  Sedrick Tober,cma

## 2021-10-26 NOTE — Telephone Encounter (Signed)
I called Kelsey and LVM informing her that the provider was out and she could call back in the morning so I could help her. Natalya Domzalski,cma

## 2021-10-26 NOTE — Telephone Encounter (Signed)
Please call Merleen Nicely from [facility], [phone]. Please call her about the diagnosed code on orders, unable to use.

## 2021-10-28 DIAGNOSIS — C7951 Secondary malignant neoplasm of bone: Secondary | ICD-10-CM | POA: Diagnosis not present

## 2021-10-28 DIAGNOSIS — K759 Inflammatory liver disease, unspecified: Secondary | ICD-10-CM | POA: Diagnosis not present

## 2021-10-28 DIAGNOSIS — M170 Bilateral primary osteoarthritis of knee: Secondary | ICD-10-CM | POA: Diagnosis not present

## 2021-10-28 DIAGNOSIS — K219 Gastro-esophageal reflux disease without esophagitis: Secondary | ICD-10-CM | POA: Diagnosis not present

## 2021-10-28 DIAGNOSIS — Z87891 Personal history of nicotine dependence: Secondary | ICD-10-CM | POA: Diagnosis not present

## 2021-10-28 DIAGNOSIS — Z9181 History of falling: Secondary | ICD-10-CM | POA: Diagnosis not present

## 2021-10-28 DIAGNOSIS — K7460 Unspecified cirrhosis of liver: Secondary | ICD-10-CM | POA: Diagnosis not present

## 2021-10-28 DIAGNOSIS — F32A Depression, unspecified: Secondary | ICD-10-CM | POA: Diagnosis not present

## 2021-10-28 DIAGNOSIS — S0003XD Contusion of scalp, subsequent encounter: Secondary | ICD-10-CM | POA: Diagnosis not present

## 2021-10-28 DIAGNOSIS — H9190 Unspecified hearing loss, unspecified ear: Secondary | ICD-10-CM | POA: Diagnosis not present

## 2021-10-28 DIAGNOSIS — F0283 Dementia in other diseases classified elsewhere, unspecified severity, with mood disturbance: Secondary | ICD-10-CM | POA: Diagnosis not present

## 2021-10-28 DIAGNOSIS — C3491 Malignant neoplasm of unspecified part of right bronchus or lung: Secondary | ICD-10-CM | POA: Diagnosis not present

## 2021-10-28 DIAGNOSIS — C3492 Malignant neoplasm of unspecified part of left bronchus or lung: Secondary | ICD-10-CM | POA: Diagnosis not present

## 2021-10-28 DIAGNOSIS — G309 Alzheimer's disease, unspecified: Secondary | ICD-10-CM | POA: Diagnosis not present

## 2021-10-28 DIAGNOSIS — D630 Anemia in neoplastic disease: Secondary | ICD-10-CM | POA: Diagnosis not present

## 2021-10-28 DIAGNOSIS — F0284 Dementia in other diseases classified elsewhere, unspecified severity, with anxiety: Secondary | ICD-10-CM | POA: Diagnosis not present

## 2021-10-28 DIAGNOSIS — Z79899 Other long term (current) drug therapy: Secondary | ICD-10-CM | POA: Diagnosis not present

## 2021-10-28 DIAGNOSIS — I10 Essential (primary) hypertension: Secondary | ICD-10-CM | POA: Diagnosis not present

## 2021-10-28 DIAGNOSIS — E039 Hypothyroidism, unspecified: Secondary | ICD-10-CM | POA: Diagnosis not present

## 2021-11-07 DIAGNOSIS — C3492 Malignant neoplasm of unspecified part of left bronchus or lung: Secondary | ICD-10-CM | POA: Diagnosis not present

## 2021-11-07 DIAGNOSIS — S0003XD Contusion of scalp, subsequent encounter: Secondary | ICD-10-CM | POA: Diagnosis not present

## 2021-11-07 DIAGNOSIS — K7460 Unspecified cirrhosis of liver: Secondary | ICD-10-CM | POA: Diagnosis not present

## 2021-11-07 DIAGNOSIS — Z9181 History of falling: Secondary | ICD-10-CM | POA: Diagnosis not present

## 2021-11-07 DIAGNOSIS — E039 Hypothyroidism, unspecified: Secondary | ICD-10-CM | POA: Diagnosis not present

## 2021-11-07 DIAGNOSIS — K219 Gastro-esophageal reflux disease without esophagitis: Secondary | ICD-10-CM | POA: Diagnosis not present

## 2021-11-07 DIAGNOSIS — Z79899 Other long term (current) drug therapy: Secondary | ICD-10-CM | POA: Diagnosis not present

## 2021-11-07 DIAGNOSIS — H9190 Unspecified hearing loss, unspecified ear: Secondary | ICD-10-CM | POA: Diagnosis not present

## 2021-11-07 DIAGNOSIS — M170 Bilateral primary osteoarthritis of knee: Secondary | ICD-10-CM | POA: Diagnosis not present

## 2021-11-07 DIAGNOSIS — F0283 Dementia in other diseases classified elsewhere, unspecified severity, with mood disturbance: Secondary | ICD-10-CM | POA: Diagnosis not present

## 2021-11-07 DIAGNOSIS — F32A Depression, unspecified: Secondary | ICD-10-CM | POA: Diagnosis not present

## 2021-11-07 DIAGNOSIS — I10 Essential (primary) hypertension: Secondary | ICD-10-CM | POA: Diagnosis not present

## 2021-11-07 DIAGNOSIS — D630 Anemia in neoplastic disease: Secondary | ICD-10-CM | POA: Diagnosis not present

## 2021-11-07 DIAGNOSIS — Z87891 Personal history of nicotine dependence: Secondary | ICD-10-CM | POA: Diagnosis not present

## 2021-11-07 DIAGNOSIS — C3491 Malignant neoplasm of unspecified part of right bronchus or lung: Secondary | ICD-10-CM | POA: Diagnosis not present

## 2021-11-07 DIAGNOSIS — G309 Alzheimer's disease, unspecified: Secondary | ICD-10-CM | POA: Diagnosis not present

## 2021-11-07 DIAGNOSIS — C7951 Secondary malignant neoplasm of bone: Secondary | ICD-10-CM | POA: Diagnosis not present

## 2021-11-07 DIAGNOSIS — K759 Inflammatory liver disease, unspecified: Secondary | ICD-10-CM | POA: Diagnosis not present

## 2021-11-07 DIAGNOSIS — F0284 Dementia in other diseases classified elsewhere, unspecified severity, with anxiety: Secondary | ICD-10-CM | POA: Diagnosis not present

## 2021-11-08 ENCOUNTER — Inpatient Hospital Stay: Payer: PPO

## 2021-11-08 ENCOUNTER — Inpatient Hospital Stay: Payer: PPO | Attending: Oncology | Admitting: Oncology

## 2021-11-08 ENCOUNTER — Encounter: Payer: Self-pay | Admitting: Oncology

## 2021-11-08 VITALS — BP 83/53 | HR 56

## 2021-11-08 VITALS — BP 68/49 | HR 60 | Temp 97.8°F | Resp 16 | Ht 67.0 in | Wt 173.9 lb

## 2021-11-08 DIAGNOSIS — F03918 Unspecified dementia, unspecified severity, with other behavioral disturbance: Secondary | ICD-10-CM | POA: Insufficient documentation

## 2021-11-08 DIAGNOSIS — K7460 Unspecified cirrhosis of liver: Secondary | ICD-10-CM | POA: Insufficient documentation

## 2021-11-08 DIAGNOSIS — Z5112 Encounter for antineoplastic immunotherapy: Secondary | ICD-10-CM | POA: Diagnosis not present

## 2021-11-08 DIAGNOSIS — C7951 Secondary malignant neoplasm of bone: Secondary | ICD-10-CM | POA: Diagnosis not present

## 2021-11-08 DIAGNOSIS — Z79899 Other long term (current) drug therapy: Secondary | ICD-10-CM | POA: Insufficient documentation

## 2021-11-08 DIAGNOSIS — Z8041 Family history of malignant neoplasm of ovary: Secondary | ICD-10-CM | POA: Diagnosis not present

## 2021-11-08 DIAGNOSIS — E063 Autoimmune thyroiditis: Secondary | ICD-10-CM | POA: Insufficient documentation

## 2021-11-08 DIAGNOSIS — Z87891 Personal history of nicotine dependence: Secondary | ICD-10-CM | POA: Insufficient documentation

## 2021-11-08 DIAGNOSIS — I950 Idiopathic hypotension: Secondary | ICD-10-CM

## 2021-11-08 DIAGNOSIS — Z79890 Hormone replacement therapy (postmenopausal): Secondary | ICD-10-CM | POA: Insufficient documentation

## 2021-11-08 DIAGNOSIS — C3411 Malignant neoplasm of upper lobe, right bronchus or lung: Secondary | ICD-10-CM | POA: Diagnosis not present

## 2021-11-08 LAB — CBC WITH DIFFERENTIAL/PLATELET
Abs Immature Granulocytes: 0.02 10*3/uL (ref 0.00–0.07)
Basophils Absolute: 0.1 10*3/uL (ref 0.0–0.1)
Basophils Relative: 1 %
Eosinophils Absolute: 0.1 10*3/uL (ref 0.0–0.5)
Eosinophils Relative: 2 %
HCT: 35.1 % — ABNORMAL LOW (ref 36.0–46.0)
Hemoglobin: 11 g/dL — ABNORMAL LOW (ref 12.0–15.0)
Immature Granulocytes: 0 %
Lymphocytes Relative: 20 %
Lymphs Abs: 1.2 10*3/uL (ref 0.7–4.0)
MCH: 28.6 pg (ref 26.0–34.0)
MCHC: 31.3 g/dL (ref 30.0–36.0)
MCV: 91.2 fL (ref 80.0–100.0)
Monocytes Absolute: 0.4 10*3/uL (ref 0.1–1.0)
Monocytes Relative: 8 %
Neutro Abs: 4 10*3/uL (ref 1.7–7.7)
Neutrophils Relative %: 69 %
Platelets: 180 10*3/uL (ref 150–400)
RBC: 3.85 MIL/uL — ABNORMAL LOW (ref 3.87–5.11)
RDW: 16.4 % — ABNORMAL HIGH (ref 11.5–15.5)
WBC: 5.8 10*3/uL (ref 4.0–10.5)
nRBC: 0 % (ref 0.0–0.2)

## 2021-11-08 LAB — COMPREHENSIVE METABOLIC PANEL
ALT: 21 U/L (ref 0–44)
AST: 30 U/L (ref 15–41)
Albumin: 3.6 g/dL (ref 3.5–5.0)
Alkaline Phosphatase: 91 U/L (ref 38–126)
Anion gap: 7 (ref 5–15)
BUN: 18 mg/dL (ref 8–23)
CO2: 28 mmol/L (ref 22–32)
Calcium: 8.6 mg/dL — ABNORMAL LOW (ref 8.9–10.3)
Chloride: 104 mmol/L (ref 98–111)
Creatinine, Ser: 1.23 mg/dL — ABNORMAL HIGH (ref 0.44–1.00)
GFR, Estimated: 46 mL/min — ABNORMAL LOW (ref >60)
Glucose, Bld: 86 mg/dL (ref 70–99)
Potassium: 3.6 mmol/L (ref 3.5–5.1)
Sodium: 139 mmol/L (ref 135–145)
Total Bilirubin: 0.6 mg/dL (ref 0.3–1.2)
Total Protein: 6.7 g/dL (ref 6.5–8.1)

## 2021-11-08 MED ORDER — SODIUM CHLORIDE 0.9 % IV SOLN
Freq: Once | INTRAVENOUS | Status: AC
  Filled 2021-11-08: qty 250

## 2021-11-08 MED ORDER — HEPARIN SOD (PORK) LOCK FLUSH 100 UNIT/ML IV SOLN
INTRAVENOUS | Status: AC
  Filled 2021-11-08: qty 5

## 2021-11-08 MED ORDER — HEPARIN SOD (PORK) LOCK FLUSH 100 UNIT/ML IV SOLN
500.0000 [IU] | Freq: Once | INTRAVENOUS | Status: AC | PRN
  Administered 2021-11-08: 500 [IU]
  Filled 2021-11-08: qty 5

## 2021-11-08 MED ORDER — SODIUM CHLORIDE 0.9 % IV SOLN
1200.0000 mg | Freq: Once | INTRAVENOUS | Status: AC
  Administered 2021-11-08: 1200 mg via INTRAVENOUS
  Filled 2021-11-08: qty 20

## 2021-11-08 NOTE — Patient Instructions (Signed)
[facility] [place]  Discharge Instructions: Thank you for choosing [facility] to provide your oncology and hematology care.  If you have a lab appointment with the [facility], please go directly to the [facility] and check in at the registration area.  Wear comfortable clothing and clothing appropriate for easy access to any Portacath or PICC line.   We strive to give you quality time with your provider. You may need to reschedule your appointment if you arrive late (15 or more minutes).  Arriving late affects you and other patients whose appointments are after yours.  Also, if you miss three or more appointments without notifying the office, you may be dismissed from the clinic at the provider's discretion.      For prescription refill requests, have your pharmacy contact our office and allow 72 hours for refills to be completed.    Today you received the following chemotherapy and/or immunotherapy agents: Tecentriq      To help prevent nausea and vomiting after your treatment, we encourage you to take your nausea medication as directed.  BELOW ARE SYMPTOMS THAT SHOULD BE REPORTED IMMEDIATELY: *FEVER GREATER THAN 100.4 F (38 C) OR HIGHER *CHILLS OR SWEATING *NAUSEA AND VOMITING THAT IS NOT CONTROLLED WITH YOUR NAUSEA MEDICATION *UNUSUAL SHORTNESS OF BREATH *UNUSUAL BRUISING OR BLEEDING *URINARY PROBLEMS (pain or burning when urinating, or frequent urination) *BOWEL PROBLEMS (unusual diarrhea, constipation, pain near the anus) TENDERNESS IN MOUTH AND THROAT WITH OR WITHOUT PRESENCE OF ULCERS (sore throat, sores in mouth, or a toothache) UNUSUAL RASH, SWELLING OR PAIN  UNUSUAL VAGINAL DISCHARGE OR ITCHING   Items with * indicate a potential emergency and should be followed up as soon as possible or go to the Emergency Department if any problems should occur.  Please show the CHEMOTHERAPY ALERT CARD or IMMUNOTHERAPY ALERT CARD at check-in to  the Emergency Department and triage nurse.  Should you have questions after your visit or need to cancel or reschedule your appointment, please contact [facility] [place] [facility] [place]  [phone] and follow the prompts.  Office hours are 8:00 a.m. to 4:30 p.m. Monday - Friday. Please note that voicemails left after 4:00 p.m. may not be returned until the following business day.  We are closed weekends and major holidays. You have access to a nurse at all times for urgent questions. Please call the main number to the clinic [phone] and follow the prompts.  For any non-urgent questions, you may also contact your provider using MyChart. We now offer e-Visits for anyone 73 and older to request care online for non-urgent symptoms. For details visit [email or web address].   Also download the MyChart app! Go to the app store, search "MyChart", open the app, select [facility], and log in with your MyChart username and password.  Masks are optional in the cancer centers. If you would like for your care team to wear a mask while they are taking care of you, please let them know. For doctor visits, patients may have with them one support person who is at least 74 years old. At this time, visitors are not allowed in the infusion area.

## 2021-11-08 NOTE — Progress Notes (Signed)
BP 83/53 after receiving Tecentriq and one liter of NS bolus. Pt asymptomatic. MD aware and okay to discharge home

## 2021-11-08 NOTE — Progress Notes (Signed)
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  She has been referred to [place] for findings of bone metastases and her recent MRI.  She has a prior history of 3 packs/day day smoking for over 45 years and quit smoking 5 years ago.She had a CT chest abdomen pelvis in 2018 which showed a 5 mm lung nodule in the left lower lobe.  Recently over the last 2 months patient has been having worsening back pain and was referred to orthopedics.  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  Pathologic fracture of S1.   PET scan showed 2 RUL lung nodules, hilar and mediastinal adenopathy and widespread bone mets. MRI brain negative.   Patient completed palliative RT to her spine. Bronchoscopy showed small cell lung cancer.  Palliative carboplatin, etoposide and Tecentriq started on 08/18/2018. Scans after 4 cycles showed stable disease. 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  She reports being compliant but her  values fluctuate widely.    Interval history-she is not sure if she has been taking her thyroid medications regularly or not.  She has baseline dementia and confusion does report falling at home a few times and was in the ER following another fall and CT head showed scalp hematoma but no other acute findings.  ECOG PS- 1 Pain scale- 0  Review of systems- Review of Systems  Constitutional:  Negative for chills, fever, malaise/fatigue and weight loss.  HENT:  Negative for congestion, ear discharge and nosebleeds.   Eyes:  Negative for blurred vision.  Respiratory:  Negative for cough, hemoptysis, sputum production, shortness of breath and wheezing.   Cardiovascular:  Negative for chest pain, palpitations, orthopnea and claudication.  Gastrointestinal:  Negative for abdominal pain, blood in stool, constipation, diarrhea, heartburn, melena, nausea and vomiting.  Genitourinary:  Negative for dysuria, flank pain, frequency, hematuria and urgency.  Musculoskeletal:  Positive for falls. Negative for back pain, joint pain and myalgias.  Skin:  Negative for rash.  Neurological:  Negative for dizziness, tingling, focal weakness, seizures, weakness and headaches.  Endo/Heme/Allergies:  Does not bruise/bleed easily.  Psychiatric/Behavioral:  Negative for depression and suicidal ideas. The patient does not have insomnia.       Allergies  Allergen Reactions   Bee Venom Swelling     Past Medical History:  Diagnosis Date   Arthritis    Cirrhosis ([place])    Dementia ([place])    Depression    GERD (gastroesophageal reflux disease)    Hyperlipidemia    Hypertension    Lung cancer ([place])    Metastatic bone cancer  Past Surgical History:  Procedure Laterality Date   APPENDECTOMY  1971   CHOLECYSTECTOMY  1971   COLONOSCOPY WITH PROPOFOL N/A 11/15/2016   Procedure: COLONOSCOPY WITH PROPOFOL;  Surgeon: Jonathon Bellows, MD;  Location: [facility] ENDOSCOPY;  Service: Gastroenterology;  Laterality: N/A;    ENDOBRONCHIAL ULTRASOUND Right 07/30/2018   Procedure: ENDOBRONCHIAL ULTRASOUND;  Surgeon: Laverle Hobby, MD;  Location: [facility] ORS;  Service: Pulmonary;  Laterality: Right;   ESOPHAGOGASTRODUODENOSCOPY (EGD) WITH PROPOFOL N/A 01/07/2017   Procedure: ESOPHAGOGASTRODUODENOSCOPY (EGD) WITH PROPOFOL;  Surgeon: Jonathon Bellows, MD;  [facility] END[facility]COPY;  Service: Gastroenterology;  Laterality: N/A;   LAPAROSCOPY N/A 03/01/2017   Procedure: LAPAROSCOPY DIAGNOSTIC;  Surgeon: Robert Bellow, MD;  Location: [facility] ORS;  Service: General;  Laterality: N/A;   PORTA CATH INSERTION N/A 08/14/2018   Procedure: PORTA CATH INSERTION;  Surgeon: Algernon Huxley, MD;  Location: [place] CV LAB;  Service: Cardiovascular;  Laterality: N/A;   TONSILECTOMY, ADENOIDECTOMY, BILATERAL MYRINGOTOMY AND TUBES  1955   TONSILLECTOMY     VENTRAL HERNIA REPAIR N/A 03/01/2017   10 x 14 CM Ventralight ST mesh, intraperitoneal location.    VENTRAL HERNIA REPAIR N/A 03/01/2017   Procedure: HERNIA REPAIR VENTRAL ADULT;  Surgeon: Robert Bellow, MD;  Location: [facility] ORS;  Service: General;  Laterality: N/A;    Social History   Socioeconomic History   Marital status: Married    Spouse name: Johnny   Number of children: Not on file   Years of education: Not on file   Highest education level: Not on file  Occupational History   Not on file  Tobacco Use   Smoking status: Former    Packs/day: 2.00    Years: 50.00    Total pack years: 100.00    Types: Cigarettes, E-cigarettes    Quit date: 02/07/2012    Years since quitting: 9.7   Smokeless tobacco: Never  Vaping Use   Vaping Use: Former   Start date: 10/06/2013   Devices: uses no liquid  Substance and Sexual Activity   Alcohol use: No    Alcohol/week: 0.0 standard drinks of alcohol   Drug use: No   Sexual activity: Yes  Other Topics Concern   Not on file  Social History Narrative   Married   Retired   Clinical cytogeneticist level of education    No children     1 cup of coffee   Social Determinants of Health   Financial Resource Strain: Low Risk  (09/08/2020)   Overall Financial Resource Strain (CARDIA)    Difficulty of Paying Living Expenses: Not hard at all  Food Insecurity: No Food Insecurity (09/08/2020)   Hunger Vital Sign    Worried About Running Out of Food in the Last Year: Never true    [place] in the Last Year: Never true  Transportation Needs: No Transportation Needs (09/08/2020)   PRAPARE - Hydrologist (Medical): No    Lack of Transportation (Non-Medical): No  Physical Activity: Unknown (10/29/2018)   Exercise Vital Sign    Days of Exercise per Week: Patient refused    Minutes of Exercise per Session: Patient refused  Stress: No Stress Concern Present (09/08/2020)   [place]    Feeling of Stress : Not at all  Social Connections: Unknown (09/08/2020)   Social Connection and Isolation Panel [NHANES]    Frequency of Communication with Friends and Family: More than three times a week  Frequency of Social Gatherings with Friends and Family: Not on file    Attends Religious Services: Not on file    Active Member of Clubs or Organizations: Not on file    Attends Archivist Meetings: Not on file    Marital Status: Married  Intimate Partner Violence: Not At Risk (09/08/2020)   Humiliation, Afraid, Rape, and Kick questionnaire    Fear of Current or Ex-Partner: No    Emotionally Abused: No    Physically Abused: No    Sexually Abused: No    Family History  Problem Relation Age of Onset   Hypertension Mother    Ovarian cancer Mother 71   Heart disease Father    Stroke Father    Ovarian cancer Sister        ? dx cancer had hyst.   Breast cancer Neg Hx      Current Outpatient Medications:    amLODipine (NORVASC) 5 MG tablet, Take 1 tablet (5 mg total) by mouth daily., Disp: 90 tablet, Rfl: 3   buPROPion (WELLBUTRIN XL)  300 MG 24 hr tablet, Take 1 tablet by mouth once daily, Disp: 90 tablet, Rfl: 3   clobetasol cream (TEMOVATE) 2.13 %, Apply 1 application topically 2 (two) times daily. Lower back, Disp: 60 g, Rfl: 1   fluticasone (FLONASE) 50 MCG/ACT nasal spray, Place 2 sprays into both nostrils daily., Disp: 16 g, Rfl: 6   folic acid (FOLVITE) 1 MG tablet, Take 1 tablet by mouth once daily, Disp: 90 tablet, Rfl: 0   ibuprofen (ADVIL,MOTRIN) 200 MG tablet, Take 400 mg by mouth every 8 (eight) hours as needed for headache or moderate pain., Disp: , Rfl:    levothyroxine (SYNTHROID) 125 MCG tablet, Take 1 tablet (125 mcg total) by mouth daily before breakfast., Disp: 30 tablet, Rfl: 3   Multiple Vitamins-Minerals (PRESERVISION AREDS 2) CAPS, Take 1 capsule by mouth 2 (two) times a day. , Disp: , Rfl:    pantoprazole (PROTONIX) 40 MG tablet, TAKE 1 TABLET BY MOUTH TWICE DAILY BEFORE A MEAL, Disp: 180 tablet, Rfl: 0   potassium chloride SA (KLOR-CON M) 20 MEQ tablet, Take 1 tablet by mouth once daily, Disp: 14 tablet, Rfl: 0   QUEtiapine (SEROQUEL) 25 MG tablet, Take 0.5 tablets (12.5 mg total) by mouth at bedtime., Disp: 15 tablet, Rfl: 6   rosuvastatin (CRESTOR) 20 MG tablet, Take 1 tablet by mouth once daily, Disp: 90 tablet, Rfl: 0   sertraline (ZOLOFT) 100 MG tablet, Take 1.5 tablets (150 mg total) by mouth daily., Disp: 135 tablet, Rfl: 1   rivastigmine (EXELON) 1.5 MG capsule, Take 1 capsule (1.5 mg total) by mouth 2 (two) times daily., Disp: 60 capsule, Rfl: 6 No current facility-administered medications for this visit.  Facility-Administered Medications Ordered in Other Visits:    denosumab (XGEVA) injection 120 mg, 120 mg, Subcutaneous, Q30 days, Sindy Guadeloupe, MD, 120 mg at 03/05/19 1525   heparin lock flush 100 UNIT/ML injection, , , ,   Physical exam:  Vitals:   11/08/21 1359  BP: (!) 68/49  Pulse: 60  Resp: 16  Temp: 97.8 F (36.6 C)  TempSrc: Oral  Weight: 173 lb 14.4 oz (78.9 kg)  Height:  5\' 7"  (1.702 m)   Physical Exam Constitutional:      General: She is not in acute distress. Cardiovascular:     Rate and Rhythm: Normal rate and regular rhythm.     Heart sounds: Normal heart sounds.  Pulmonary:  Effort: Pulmonary effort is normal.     Breath sounds: Normal breath sounds.  Abdominal:     General: Bowel sounds are normal.     Palpations: Abdomen is soft.  Skin:    General: Skin is warm and dry.  Neurological:     Mental Status: She is alert and oriented to person, place, and time.         Latest Ref Rng & Units 11/08/2021    1:32 PM  CMP  Glucose 70 - 99 mg/dL 86   BUN 8 - 23 mg/dL 18   Creatinine 0.44 - 1.00 mg/dL 1.23   Sodium 135 - 145 mmol/L 139   Potassium 3.5 - 5.1 mmol/L 3.6   Chloride 98 - 111 mmol/L 104   CO2 22 - 32 mmol/L 28   Calcium 8.9 - 10.3 mg/dL 8.6   Total Protein 6.5 - 8.1 g/dL 6.7   Total Bilirubin 0.3 - 1.2 mg/dL 0.6   Alkaline Phos 38 - 126 U/L 91   AST 15 - 41 U/L 30   ALT 0 - 44 U/L 21       Latest Ref Rng & Units 11/08/2021    1:32 PM  CBC  WBC 4.0 - 10.5 K/uL 5.8   Hemoglobin 12.0 - 15.0 g/dL 11.0   Hematocrit 36.0 - 46.0 % 35.1   Platelets 150 - 400 K/uL 180     No images are attached to the encounter.  CT HEAD WO CONTRAST  Result Date: 10/19/2021 CLINICAL DATA:  Head trauma, minor.  Fall. EXAM: CT HEAD WITHOUT CONTRAST TECHNIQUE: Contiguous axial images were obtained from the base of the skull through the vertex without intravenous contrast. RADIATION DOSE REDUCTION: This exam was performed according to the departmental dose-optimization program which includes automated exposure control, adjustment of the mA and/or kV according to patient size and/or use of iterative reconstruction technique. COMPARISON:  CT examination dated July 22, 2021. FINDINGS: Brain: No evidence of acute infarction, hemorrhage, hydrocephalus, extra-axial collection or mass lesion/mass effect. Prominence of the ventricles and sulci secondary to  mild cerebral volume loss. Mild chronic microvascular ischemic changes of the white matter. Vascular: No hyperdense vessel or unexpected calcification. Skull: Right occipital scalp swelling. Negative for fracture or focal lesion. Sinuses/Orbits: Partial opacification of the right maxillary sinus. Other: None. IMPRESSION: 1.  No acute intracranial abnormality. 2. Right occipital scalp hematoma without evidence of calvarial fracture. Electronically Signed   By: Keane Police D.O.   On: 10/19/2021 11:14     Assessment and plan- Patient is a 71 y.o. 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  I will see her back in 3 weeks for cycle 51.  I plan to do a repeat scan sometime in end of December 2023 and if she continues to have stable disease I willPlan to stop treatment for her off treatment given that she has been on treatment for herSmall cell lung cancer since 2020  Autoimmune hypothyroidism: We will repeat TSH at next visit.  She is not compliant with her thyroid medication and it is difficult to manage her levothyroxine dosage without knowing if she is taking it regularly or not.  She is hypotensive today with systolic blood pressure in the 70s.  Serum creatinine elevated at 1.23: I will give her 1 L of IV fluids today.  I will reach out  to Dr. Caryl Bis and see if her Norvasc can be stopped if her blood pressures have been running low and if that could be contributing to her falls to some extent Visit Diagnosis 1. Small cell lung cancer, right upper lobe ([place])   2. Encounter for antineoplastic immunotherapy      Dr. Randa Evens, MD, MPH [facility] [phone] 11/08/2021 4:38 PM

## 2021-11-10 DIAGNOSIS — D630 Anemia in neoplastic disease: Secondary | ICD-10-CM | POA: Diagnosis not present

## 2021-11-10 DIAGNOSIS — Z79899 Other long term (current) drug therapy: Secondary | ICD-10-CM

## 2021-11-10 DIAGNOSIS — G309 Alzheimer's disease, unspecified: Secondary | ICD-10-CM | POA: Diagnosis not present

## 2021-11-10 DIAGNOSIS — F0283 Dementia in other diseases classified elsewhere, unspecified severity, with mood disturbance: Secondary | ICD-10-CM | POA: Diagnosis not present

## 2021-11-10 DIAGNOSIS — S0003XD Contusion of scalp, subsequent encounter: Secondary | ICD-10-CM | POA: Diagnosis not present

## 2021-11-10 DIAGNOSIS — Z87891 Personal history of nicotine dependence: Secondary | ICD-10-CM

## 2021-11-10 DIAGNOSIS — E039 Hypothyroidism, unspecified: Secondary | ICD-10-CM

## 2021-11-10 DIAGNOSIS — C3492 Malignant neoplasm of unspecified part of left bronchus or lung: Secondary | ICD-10-CM | POA: Diagnosis not present

## 2021-11-10 DIAGNOSIS — K219 Gastro-esophageal reflux disease without esophagitis: Secondary | ICD-10-CM

## 2021-11-10 DIAGNOSIS — F32A Depression, unspecified: Secondary | ICD-10-CM | POA: Diagnosis not present

## 2021-11-10 DIAGNOSIS — Z9181 History of falling: Secondary | ICD-10-CM

## 2021-11-10 DIAGNOSIS — H9190 Unspecified hearing loss, unspecified ear: Secondary | ICD-10-CM

## 2021-11-10 DIAGNOSIS — K7460 Unspecified cirrhosis of liver: Secondary | ICD-10-CM | POA: Diagnosis not present

## 2021-11-10 DIAGNOSIS — M170 Bilateral primary osteoarthritis of knee: Secondary | ICD-10-CM | POA: Diagnosis not present

## 2021-11-10 DIAGNOSIS — C7951 Secondary malignant neoplasm of bone: Secondary | ICD-10-CM | POA: Diagnosis not present

## 2021-11-10 DIAGNOSIS — K759 Inflammatory liver disease, unspecified: Secondary | ICD-10-CM

## 2021-11-10 DIAGNOSIS — C3491 Malignant neoplasm of unspecified part of right bronchus or lung: Secondary | ICD-10-CM | POA: Diagnosis not present

## 2021-11-10 DIAGNOSIS — F0284 Dementia in other diseases classified elsewhere, unspecified severity, with anxiety: Secondary | ICD-10-CM | POA: Diagnosis not present

## 2021-11-10 DIAGNOSIS — I10 Essential (primary) hypertension: Secondary | ICD-10-CM | POA: Diagnosis not present

## 2021-11-17 ENCOUNTER — Telehealth: Payer: PPO | Admitting: Hospice and Palliative Medicine

## 2021-11-20 ENCOUNTER — Inpatient Hospital Stay (HOSPITAL_BASED_OUTPATIENT_CLINIC_OR_DEPARTMENT_OTHER): Payer: PPO | Admitting: Hospice and Palliative Medicine

## 2021-11-20 DIAGNOSIS — Z515 Encounter for palliative care: Secondary | ICD-10-CM

## 2021-11-20 NOTE — Progress Notes (Signed)
Unable to reach patient by phone.  Unable to voicemail.  Will reschedule

## 2021-11-22 ENCOUNTER — Encounter: Payer: Self-pay | Admitting: Oncology

## 2021-11-22 ENCOUNTER — Telehealth: Payer: Self-pay | Admitting: Family Medicine

## 2021-11-22 NOTE — Telephone Encounter (Signed)
Noted  

## 2021-11-22 NOTE — Telephone Encounter (Signed)
Signing encounter, See previous note 06/03/21

## 2021-11-22 NOTE — Telephone Encounter (Signed)
Pt called stating she fell at the [employer] store a few days ago and now she has a bruise on her hip down the side. Pt did not go anywhere to get evaluated, but she has an appointment with Scott on 10/19 at 7am

## 2021-11-23 ENCOUNTER — Ambulatory Visit (INDEPENDENT_AMBULATORY_CARE_PROVIDER_SITE_OTHER): Payer: PPO

## 2021-11-23 ENCOUNTER — Ambulatory Visit (INDEPENDENT_AMBULATORY_CARE_PROVIDER_SITE_OTHER): Payer: PPO | Admitting: Internal Medicine

## 2021-11-23 ENCOUNTER — Encounter: Payer: Self-pay | Admitting: Internal Medicine

## 2021-11-23 VITALS — BP 140/84 | HR 77 | Temp 97.5°F | Resp 17 | Ht 67.0 in | Wt 172.5 lb

## 2021-11-23 DIAGNOSIS — M25552 Pain in left hip: Secondary | ICD-10-CM

## 2021-11-23 DIAGNOSIS — M545 Low back pain, unspecified: Secondary | ICD-10-CM | POA: Diagnosis not present

## 2021-11-23 DIAGNOSIS — T148XXA Other injury of unspecified body region, initial encounter: Secondary | ICD-10-CM

## 2021-11-23 DIAGNOSIS — M5440 Lumbago with sciatica, unspecified side: Secondary | ICD-10-CM

## 2021-11-23 DIAGNOSIS — I10 Essential (primary) hypertension: Secondary | ICD-10-CM

## 2021-11-23 DIAGNOSIS — S7002XA Contusion of left hip, initial encounter: Secondary | ICD-10-CM

## 2021-11-23 DIAGNOSIS — G8929 Other chronic pain: Secondary | ICD-10-CM | POA: Diagnosis not present

## 2021-11-23 DIAGNOSIS — R296 Repeated falls: Secondary | ICD-10-CM | POA: Diagnosis not present

## 2021-11-23 DIAGNOSIS — M5136 Other intervertebral disc degeneration, lumbar region: Secondary | ICD-10-CM | POA: Diagnosis not present

## 2021-11-23 NOTE — Progress Notes (Signed)
Patient ID: Anne [name], female   DOB: [birth year], 71 y.o.   MRN: [id number]   Subjective:    Patient ID: Anne [name], female    DOB: Jul 18, 1950, 71 y.o.   MRN: [id number]   Patient here for  Chief Complaint  Patient presents with   [place] at [employer] store on Monday and has a bruise on the left hip down to leg. H/O falls. Did not hit head or pass out   .   HPI Anne [name] - here for work in appt - pt of Dr Caryl Bis.  She is accompanied by her husband - Buyer, retail.  History obtained from both of them.  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  Here for work in - recent fall - bruise - hip and side. States Monday 11/20/21 - coming out of [employer] store - step wrong at the curb.  Fell on her side.  No head injury.  Able to get up - walk.  Came in today due to increased bruising left hip.  Denies any syncope or near syncope.  No dizziness or light headedness and no LOC - prior to fall.  No chest pain.  Breathing stable.  In reviewing - has had frequent falls.     Past Medical History:  Diagnosis Date   Arthritis    Cirrhosis ([place])    Dementia ([place])    Depression    GERD (gastroesophageal reflux disease)    Hyperlipidemia    Hypertension    Lung cancer ([place])    Metastatic bone cancer    Past Surgical History:  Procedure Laterality Date   APPENDECTOMY  1971   CHOLECYSTECTOMY  1971   COLONOSCOPY WITH PROPOFOL N/A 11/15/2016   Procedure: COLONOSCOPY WITH PROPOFOL;  Surgeon: Jonathon Bellows, MD;  Location: [facility] ENDOSCOPY;  Service: Gastroenterology;  Laterality: N/A;   ENDOBRONCHIAL ULTRASOUND Right 07/30/2018   Procedure: ENDOBRONCHIAL ULTRASOUND;  Surgeon: Laverle Hobby, MD;  Location: [facility] ORS;  Service: Pulmonary;  Laterality: Right;   ESOPHAGOGASTRODUODENOSCOPY (EGD) WITH PROPOFOL N/A 01/07/2017   Procedure: ESOPHAGOGASTRODUODENOSCOPY (EGD) WITH PROPOFOL;  Surgeon: Jonathon Bellows, MD;  Location: [facility] ENDOSCOPY;  Service: Gastroenterology;  Laterality:  N/A;   LAPAROSCOPY N/A 03/01/2017   Procedure: LAPAROSCOPY DIAGNOSTIC;  Surgeon: Robert Bellow, MD;  Location: [facility] ORS;  Service: General;  Laterality: N/A;   PORTA CATH INSERTION N/A 08/14/2018   Procedure: PORTA CATH INSERTION;  Surgeon: Algernon Huxley, MD;  Location: [facility] LAB;  Service: Cardiovascular;  Laterality: N/A;   TONSILECTOMY, ADENOIDECTOMY, BILATERAL MYRINGOTOMY AND TUBES  1955   TONSILLECTOMY     VENTRAL HERNIA REPAIR N/A 03/01/2017   10 x 14 CM Ventralight ST mesh, intraperitoneal location.    VENTRAL HERNIA REPAIR N/A 03/01/2017   Procedure: HERNIA REPAIR VENTRAL ADULT;  Surgeon: Robert Bellow, MD;  Location: [facility] ORS;  Service: General;  Laterality: N/A;   Family History  Problem Relation Age of Onset   Hypertension Mother    Ovarian cancer Mother 1   Heart disease Father    Stroke Father    Ovarian cancer Sister        ? dx cancer had hyst.   Breast cancer Neg Hx    Social History   Socioeconomic History   Marital status: Married    Spouse name: Johnny   Number of children: Not on file   Years of education: Not on file   Highest education level: Not on file  Occupational History   Not on file  Tobacco Use   Smoking status: Former    Packs/day: 2.00    Years: 50.00    Total pack years: 100.00    Types: Cigarettes, E-cigarettes    Quit date: 02/07/2012    Years since quitting: 9.8   Smokeless tobacco: Never  Vaping Use   Vaping Use: Former   Start date: 10/06/2013   Devices: uses no liquid  Substance and Sexual Activity   Alcohol use: No    Alcohol/week: 0.0 standard drinks of alcohol   Drug use: No   Sexual activity: Yes  Other Topics Concern   Not on file  Social History Narrative   Married   Retired   Clinical cytogeneticist level of education    No children    1 cup of coffee   Social Determinants of Health   Financial Resource Strain: Low Risk  (09/08/2020)   Overall Financial Resource Strain (CARDIA)    Difficulty of Paying  Living Expenses: Not hard at all  Food Insecurity: No Food Insecurity (09/08/2020)   Hunger Vital Sign    Worried About Running Out of Food in the Last Year: Never true    [place] in the Last Year: Never true  Transportation Needs: No Transportation Needs (09/08/2020)   PRAPARE - Hydrologist (Medical): No    Lack of Transportation (Non-Medical): No  Physical Activity: Unknown (10/29/2018)   Exercise Vital Sign    Days of Exercise per Week: Patient refused    Minutes of Exercise per Session: Patient refused  Stress: No Stress Concern Present (09/08/2020)   [place]    Feeling of Stress : Not at all  Social Connections: Unknown (09/08/2020)   Social Connection and Isolation Panel [NHANES]    Frequency of Communication with Friends and Family: More than three times a week    Frequency of Social Gatherings with Friends and Family: Not on file    Attends Religious Services: Not on file    Active Member of Clubs or Organizations: Not on file    Attends Archivist Meetings: Not on file    Marital Status: Married     Review of Systems  Constitutional:  Negative for appetite change and unexpected weight change.  HENT:  Negative for congestion and sinus pressure.   Respiratory:  Negative for cough, chest tightness and shortness of breath.   Cardiovascular:  Negative for chest pain, palpitations and leg swelling.  Gastrointestinal:  Negative for abdominal pain, diarrhea, nausea and vomiting.  Genitourinary:  Negative for difficulty urinating and dysuria.  Musculoskeletal:  Negative for myalgias.       Increased hematoma and bruising - left lateral hip and lateral leg.   Skin:  Negative for rash.       Bruising and hematoma as outlined.   Neurological:  Negative for dizziness and headaches.  Psychiatric/Behavioral:  Negative for agitation and dysphoric mood.        Objective:      BP (!) 140/84 (BP Location: Left Arm, Patient Position: Sitting, Cuff Size: Normal)   Pulse 77   Temp (!) 97.5 F (36.4 C) (Oral)   Resp 17   Ht 5\' 7"  (1.702 m)   Wt 172 lb 8 oz (78.2 kg)   SpO2 97%   BMI 27.02 kg/m  Wt Readings from Last 3 Encounters:  11/23/21 172 lb 8 oz (78.2 kg)  11/08/21  173 lb 14.4 oz (78.9 kg)  10/24/21 173 lb 12.8 oz (78.8 kg)    Physical Exam Vitals reviewed.  Constitutional:      General: She is not in acute distress.    Appearance: Normal appearance.  HENT:     Head: Normocephalic and atraumatic.     Right Ear: External ear normal.     Left Ear: External ear normal.  Eyes:     General: No scleral icterus.       Right eye: No discharge.        Left eye: No discharge.     Conjunctiva/sclera: Conjunctivae normal.  Neck:     Thyroid: No thyromegaly.  Cardiovascular:     Rate and Rhythm: Normal rate and regular rhythm.  Pulmonary:     Effort: No respiratory distress.     Breath sounds: Normal breath sounds. No wheezing.  Abdominal:     General: Bowel sounds are normal.     Palpations: Abdomen is soft.     Tenderness: There is no abdominal tenderness.  Musculoskeletal:     Cervical back: Neck supple. No tenderness.     Comments: Large hematoma with tenderness to palpation - lateral hip.  Bruising extending down left lateral thigh. No significant increased pain with abduction and adduction of left leg.  Able to weight bear and walk - without increased pain.  Pain - lower back - from previous fall.  Back hematoma - resolving.    Lymphadenopathy:     Cervical: No cervical adenopathy.  Skin:    Findings: No erythema or rash.  Neurological:     Mental Status: She is alert.  Psychiatric:        Mood and Affect: Mood normal.        Behavior: Behavior normal.      Outpatient Encounter Medications as of 11/23/2021  Medication Sig   amLODipine (NORVASC) 5 MG tablet Take 1 tablet (5 mg total) by mouth daily.   buPROPion (WELLBUTRIN XL) 300  MG 24 hr tablet Take 1 tablet by mouth once daily   clobetasol cream (TEMOVATE) 6.07 % Apply 1 application topically 2 (two) times daily. Lower back   folic acid (FOLVITE) 1 MG tablet Take 1 tablet by mouth once daily   ibuprofen (ADVIL,MOTRIN) 200 MG tablet Take 400 mg by mouth every 8 (eight) hours as needed for headache or moderate pain.   levothyroxine (SYNTHROID) 125 MCG tablet Take 1 tablet (125 mcg total) by mouth daily before breakfast.   pantoprazole (PROTONIX) 40 MG tablet TAKE 1 TABLET BY MOUTH TWICE DAILY BEFORE A MEAL   potassium chloride SA (KLOR-CON M) 20 MEQ tablet Take 1 tablet by mouth once daily   QUEtiapine (SEROQUEL) 25 MG tablet Take 0.5 tablets (12.5 mg total) by mouth at bedtime.   rivastigmine (EXELON) 1.5 MG capsule Take 1 capsule (1.5 mg total) by mouth 2 (two) times daily.   rosuvastatin (CRESTOR) 20 MG tablet Take 1 tablet by mouth once daily   sertraline (ZOLOFT) 100 MG tablet Take 1.5 tablets (150 mg total) by mouth daily.   [DISCONTINUED] fluticasone (FLONASE) 50 MCG/ACT nasal spray Place 2 sprays into both nostrils daily. (Patient not taking: Reported on 11/23/2021)   [DISCONTINUED] Multiple Vitamins-Minerals (PRESERVISION AREDS 2) CAPS Take 1 capsule by mouth 2 (two) times a day.  (Patient not taking: Reported on 11/23/2021)   Facility-Administered Encounter Medications as of 11/23/2021  Medication   denosumab (XGEVA) injection 120 mg   heparin lock flush 100 UNIT/ML injection  Lab Results  Component Value Date   WBC 5.8 11/08/2021   HGB 11.0 (L) 11/08/2021   HCT 35.1 (L) 11/08/2021   PLT 180 11/08/2021   GLUCOSE 86 11/08/2021   CHOL 177 08/24/2020   TRIG 198.0 (H) 08/24/2020   HDL 54.90 08/24/2020   LDLDIRECT 95.0 07/22/2019   LDLCALC 82 08/24/2020   ALT 21 11/08/2021   AST 30 11/08/2021   NA 139 11/08/2021   K 3.6 11/08/2021   CL 104 11/08/2021   CREATININE 1.23 (H) 11/08/2021   BUN 18 11/08/2021   CO2 28 11/08/2021   TSH 37.095 (H)  10/18/2021   INR 1.0 07/17/2021   HGBA1C 6.1 09/12/2016    CT HEAD WO CONTRAST  Result Date: 10/19/2021 CLINICAL DATA:  Head trauma, minor.  Fall. EXAM: CT HEAD WITHOUT CONTRAST TECHNIQUE: Contiguous axial images were obtained from the base of the skull through the vertex without intravenous contrast. RADIATION DOSE REDUCTION: This exam was performed according to the departmental dose-optimization program which includes automated exposure control, adjustment of the mA and/or kV according to patient size and/or use of iterative reconstruction technique. COMPARISON:  CT examination dated July 22, 2021. FINDINGS: Brain: No evidence of acute infarction, hemorrhage, hydrocephalus, extra-axial collection or mass lesion/mass effect. Prominence of the ventricles and sulci secondary to mild cerebral volume loss. Mild chronic microvascular ischemic changes of the white matter. Vascular: No hyperdense vessel or unexpected calcification. Skull: Right occipital scalp swelling. Negative for fracture or focal lesion. Sinuses/Orbits: Partial opacification of the right maxillary sinus. Other: None. IMPRESSION: 1.  No acute intracranial abnormality. 2. Right occipital scalp hematoma without evidence of calvarial fracture. Electronically Signed   By: Keane Police D.O.   On: 10/19/2021 11:14       Assessment & Plan:   Problem List Items Addressed This Visit     Benign essential HTN    On amlodipine.  Blood pressure rechecked by me 138/78.        Frequent falls    Frequent falls.  xrays today as outlined.  Discussed PT to help with balance, gait and core strengthening.  Can d/w PCP.        Hematoma    Large hematoma - lateral hip. Discussed would eventually reabsorb.  Follow closely.        Left hip pain - Primary    Recent fall as outlined.  Large hematoma with tenderness over the hematoma.  Some bruising extending down leg.  Able to walk and abduct /adduct - left leg without significant pain.  Will xray to  confirm no acute abnormality.       Relevant Orders   DG Hip Unilat W OR W/O Pelvis 2-3 Views Left (Completed)   Low back pain    States back has been sore from previous fall.  Resolving hematoma.  Check xray given history of metastatic cancer to confirm no acute bony abnormality.        Relevant Orders   DG Lumbar Spine 2-3 Views (Completed)     Einar Pheasant, MD

## 2021-11-24 ENCOUNTER — Telehealth: Payer: Self-pay

## 2021-11-24 NOTE — Telephone Encounter (Signed)
I received a fax from [facility] and they need a order for a xray of the patients left hip because of a fall she had.  If you put in the order I will fax to [phone].  Adilynne Fitzwater,cma

## 2021-11-24 NOTE — Telephone Encounter (Signed)
Faxed to [facility] and confirmation given.  Corderius Saraceni,cma

## 2021-11-24 NOTE — Telephone Encounter (Signed)
Written order signed. Please fax.

## 2021-11-27 ENCOUNTER — Encounter: Payer: Self-pay | Admitting: Internal Medicine

## 2021-11-27 DIAGNOSIS — M25552 Pain in left hip: Secondary | ICD-10-CM | POA: Diagnosis not present

## 2021-11-27 NOTE — Assessment & Plan Note (Signed)
States back has been sore from previous fall.  Resolving hematoma.  Check xray given history of metastatic cancer to confirm no acute bony abnormality.

## 2021-11-27 NOTE — Assessment & Plan Note (Signed)
Large hematoma - lateral hip. Discussed would eventually reabsorb.  Follow closely.

## 2021-11-27 NOTE — Assessment & Plan Note (Signed)
Recent fall as outlined.  Large hematoma with tenderness over the hematoma.  Some bruising extending down leg.  Able to walk and abduct /adduct - left leg without significant pain.  Will xray to confirm no acute abnormality.

## 2021-11-27 NOTE — Assessment & Plan Note (Signed)
Frequent falls.  xrays today as outlined.  Discussed PT to help with balance, gait and core strengthening.  Can d/w PCP.

## 2021-11-27 NOTE — Assessment & Plan Note (Signed)
On amlodipine.  Blood pressure rechecked by me 138/78.

## 2021-11-28 ENCOUNTER — Telehealth: Payer: Self-pay

## 2021-11-28 NOTE — Telephone Encounter (Signed)
Tried to contact pt in regards to xray results.  Called both phone numbers neither has a vm to leave a msg.

## 2021-11-29 ENCOUNTER — Inpatient Hospital Stay: Payer: PPO

## 2021-11-29 ENCOUNTER — Inpatient Hospital Stay (HOSPITAL_BASED_OUTPATIENT_CLINIC_OR_DEPARTMENT_OTHER): Payer: PPO | Admitting: Internal Medicine

## 2021-11-29 ENCOUNTER — Encounter: Payer: Self-pay | Admitting: Internal Medicine

## 2021-11-29 DIAGNOSIS — C3411 Malignant neoplasm of upper lobe, right bronchus or lung: Secondary | ICD-10-CM

## 2021-11-29 DIAGNOSIS — Z5112 Encounter for antineoplastic immunotherapy: Secondary | ICD-10-CM | POA: Diagnosis not present

## 2021-11-29 LAB — COMPREHENSIVE METABOLIC PANEL
ALT: 21 U/L (ref 0–44)
AST: 33 U/L (ref 15–41)
Albumin: 3.9 g/dL (ref 3.5–5.0)
Alkaline Phosphatase: 81 U/L (ref 38–126)
Anion gap: 5 (ref 5–15)
BUN: 18 mg/dL (ref 8–23)
CO2: 29 mmol/L (ref 22–32)
Calcium: 8.6 mg/dL — ABNORMAL LOW (ref 8.9–10.3)
Chloride: 106 mmol/L (ref 98–111)
Creatinine, Ser: 1.04 mg/dL — ABNORMAL HIGH (ref 0.44–1.00)
GFR, Estimated: 57 mL/min — ABNORMAL LOW (ref >60)
Glucose, Bld: 94 mg/dL (ref 70–99)
Potassium: 3.5 mmol/L (ref 3.5–5.1)
Sodium: 140 mmol/L (ref 135–145)
Total Bilirubin: 0.4 mg/dL (ref 0.3–1.2)
Total Protein: 7.3 g/dL (ref 6.5–8.1)

## 2021-11-29 LAB — CBC WITH DIFFERENTIAL/PLATELET
Abs Immature Granulocytes: 0.03 10*3/uL (ref 0.00–0.07)
Basophils Absolute: 0 10*3/uL (ref 0.0–0.1)
Basophils Relative: 1 %
Eosinophils Absolute: 0.2 10*3/uL (ref 0.0–0.5)
Eosinophils Relative: 3 %
HCT: 35.9 % — ABNORMAL LOW (ref 36.0–46.0)
Hemoglobin: 11.1 g/dL — ABNORMAL LOW (ref 12.0–15.0)
Immature Granulocytes: 1 %
Lymphocytes Relative: 18 %
Lymphs Abs: 1.1 10*3/uL (ref 0.7–4.0)
MCH: 28.7 pg (ref 26.0–34.0)
MCHC: 30.9 g/dL (ref 30.0–36.0)
MCV: 92.8 fL (ref 80.0–100.0)
Monocytes Absolute: 0.4 10*3/uL (ref 0.1–1.0)
Monocytes Relative: 6 %
Neutro Abs: 4.5 10*3/uL (ref 1.7–7.7)
Neutrophils Relative %: 71 %
Platelets: 170 10*3/uL (ref 150–400)
RBC: 3.87 MIL/uL (ref 3.87–5.11)
RDW: 17 % — ABNORMAL HIGH (ref 11.5–15.5)
WBC: 6.2 10*3/uL (ref 4.0–10.5)
nRBC: 0 % (ref 0.0–0.2)

## 2021-11-29 LAB — T4, FREE: Free T4: 0.67 ng/dL (ref 0.61–1.12)

## 2021-11-29 LAB — TSH: TSH: 18.926 u[IU]/mL — ABNORMAL HIGH (ref 0.350–4.500)

## 2021-11-29 MED ORDER — LEVOTHYROXINE SODIUM 125 MCG PO TABS: 125.0000 ug | ORAL_TABLET | Freq: Every day | ORAL | 3 refills | Status: DC

## 2021-11-29 MED ORDER — SODIUM CHLORIDE 0.9 % IV SOLN
Freq: Once | INTRAVENOUS | Status: AC
  Filled 2021-11-29: qty 250

## 2021-11-29 MED ORDER — SODIUM CHLORIDE 0.9% FLUSH
10.0000 mL | INTRAVENOUS | Status: DC | PRN
  Filled 2021-11-29: qty 10

## 2021-11-29 MED ORDER — SODIUM CHLORIDE 0.9 % IV SOLN
1200.0000 mg | Freq: Once | INTRAVENOUS | Status: AC
  Administered 2021-11-29: 1200 mg via INTRAVENOUS
  Filled 2021-11-29: qty 20

## 2021-11-29 MED ORDER — HEPARIN SOD (PORK) LOCK FLUSH 100 UNIT/ML IV SOLN
500.0000 [IU] | Freq: Once | INTRAVENOUS | Status: AC | PRN
  Administered 2021-11-29: 500 [IU]
  Filled 2021-11-29: qty 5

## 2021-11-29 NOTE — Patient Instructions (Signed)
[facility] CANCER CTR AT [place]  Discharge Instructions: Thank you for choosing [facility] to provide your oncology and hematology care.  If you have a lab appointment with the [facility], please go directly to the [facility] and check in at the registration area.  Wear comfortable clothing and clothing appropriate for easy access to any Portacath or PICC line.   We strive to give you quality time with your provider. You may need to reschedule your appointment if you arrive late (15 or more minutes).  Arriving late affects you and other patients whose appointments are after yours.  Also, if you miss three or more appointments without notifying the office, you may be dismissed from the clinic at the provider's discretion.      For prescription refill requests, have your pharmacy contact our office and allow 72 hours for refills to be completed.    Today you received the following chemotherapy and/or immunotherapy agents- Tecentriq      To help prevent nausea and vomiting after your treatment, we encourage you to take your nausea medication as directed.  BELOW ARE SYMPTOMS THAT SHOULD BE REPORTED IMMEDIATELY: *FEVER GREATER THAN 100.4 F (38 C) OR HIGHER *CHILLS OR SWEATING *NAUSEA AND VOMITING THAT IS NOT CONTROLLED WITH YOUR NAUSEA MEDICATION *UNUSUAL SHORTNESS OF BREATH *UNUSUAL BRUISING OR BLEEDING *URINARY PROBLEMS (pain or burning when urinating, or frequent urination) *BOWEL PROBLEMS (unusual diarrhea, constipation, pain near the anus) TENDERNESS IN MOUTH AND THROAT WITH OR WITHOUT PRESENCE OF ULCERS (sore throat, sores in mouth, or a toothache) UNUSUAL RASH, SWELLING OR PAIN  UNUSUAL VAGINAL DISCHARGE OR ITCHING   Items with * indicate a potential emergency and should be followed up as soon as possible or go to the Emergency Department if any problems should occur.  Please show the CHEMOTHERAPY ALERT CARD or IMMUNOTHERAPY ALERT CARD at check-in to  the Emergency Department and triage nurse.  Should you have questions after your visit or need to cancel or reschedule your appointment, please contact [facility] CANCER [place] AT [place]  [phone] and follow the prompts.  Office hours are 8:00 a.m. to 4:30 p.m. Monday - Friday. Please note that voicemails left after 4:00 p.m. may not be returned until the following business day.  We are closed weekends and major holidays. You have access to a nurse at all times for urgent questions. Please call the main number to the clinic [phone] and follow the prompts.  For any non-urgent questions, you may also contact your provider using MyChart. We now offer e-Visits for anyone 66 and older to request care online for non-urgent symptoms. For details visit [email or web address].   Also download the MyChart app! Go to the app store, search "MyChart", open the app, select [facility], and log in with your MyChart username and password.  Masks are optional in the cancer centers. If you would like for your care team to wear a mask while they are taking care of you, please let them know. For doctor visits, patients may have with them one support person who is at least 73 years old. At this time, visitors are not allowed in the infusion area.

## 2021-11-29 NOTE — Progress Notes (Signed)
[facility] NOTE  Patient Care Team: Leone Haven, MD as PCP - General (Family Medicine) Caryl Bis Angela Adam, MD as Consulting Physician (Family Medicine) Bary Castilla, Forest Gleason, MD (General Surgery) Telford Nab, RN as Registered Nurse Sindy Guadeloupe, MD as Consulting Physician (Hematology and Oncology)  CHIEF COMPLAINTS/PURPOSE OF CONSULTATION: Lung cancer  Oncology History  Small cell lung cancer, right upper lobe ([place])  08/05/2018 Cancer Staging   Staging form: Lung, AJCC 8th Edition - Clinical stage from 08/05/2018: Stage IVB (cT3, cN2, cM1c) - Signed by Sindy Guadeloupe, MD on 08/06/2018   08/06/2018 Initial Diagnosis   Small cell lung cancer, right upper lobe ([place])   08/18/2018 - 06/07/2021 Chemotherapy   Patient is on Treatment Plan : LUNG SCLC Carboplatin + Etoposide + Atezolizumab Induction q21d / Atezolizumab Maintenance q21d     09/27/2021 -  Chemotherapy   Patient is on Treatment Plan : LUNG SCLC Carboplatin + Etoposide + Atezolizumab Induction q21d x 4 cycles / Atezolizumab Maintenance q21d        HISTORY OF PRESENTING ILLNESS: Ambulating independently.  Alone.  Pa Tennant Croswell 71 y.o.  female with extensive stage small cell lung cancer currently on immunotherapy with Atezoluzimab is here for follow-up.  Patient follows up with Dr. Janese Banks; and I am evaluating the patient in Dr. Elroy Channel absence.  Denies any worsening shortness of breath or cough.  No hemoptysis.  Denies any worsening pain.  She is not sure if she is taking her Synthroid.  Review of Systems  Constitutional:  Positive for malaise/fatigue. Negative for chills, diaphoresis, fever and weight loss.  HENT:  Negative for nosebleeds and sore throat.   Eyes:  Negative for double vision.  Respiratory:  Negative for cough, hemoptysis, sputum production, shortness of breath and wheezing.   Cardiovascular:  Negative for chest pain, palpitations, orthopnea and leg swelling.  Gastrointestinal:  Negative  for abdominal pain, blood in stool, constipation, diarrhea, heartburn, melena, nausea and vomiting.  Genitourinary:  Negative for dysuria, frequency and urgency.  Musculoskeletal:  Negative for back pain and joint pain.  Skin: Negative.  Negative for itching and rash.  Neurological:  Negative for dizziness, tingling, focal weakness, weakness and headaches.  Endo/Heme/Allergies:  Does not bruise/bleed easily.  Psychiatric/Behavioral:  Negative for depression. The patient is not nervous/anxious and does not have insomnia.     MEDICAL HISTORY:  Past Medical History:  Diagnosis Date   Arthritis    Cirrhosis ([place])    Dementia ([place])    Depression    GERD (gastroesophageal reflux disease)    Hyperlipidemia    Hypertension    Lung cancer ([place])    Metastatic bone cancer     SURGICAL HISTORY: Past Surgical History:  Procedure Laterality Date   APPENDECTOMY  1971   CHOLECYSTECTOMY  1971   COLONOSCOPY WITH PROPOFOL N/A 11/15/2016   Procedure: COLONOSCOPY WITH PROPOFOL;  Surgeon: Jonathon Bellows, MD;  Location: [facility] ENDOSCOPY;  Service: Gastroenterology;  Laterality: N/A;   ENDOBRONCHIAL ULTRASOUND Right 07/30/2018   Procedure: ENDOBRONCHIAL ULTRASOUND;  Surgeon: Laverle Hobby, MD;  Location: [facility] ORS;  Service: Pulmonary;  Laterality: Right;   ESOPHAGOGASTRODUODENOSCOPY (EGD) WITH PROPOFOL N/A 01/07/2017   Procedure: ESOPHAGOGASTRODUODENOSCOPY (EGD) WITH PROPOFOL;  Surgeon: Jonathon Bellows, MD;  Location: [facility] ENDOSCOPY;  Service: Gastroenterology;  Laterality: N/A;   LAPAROSCOPY N/A 03/01/2017   Procedure: LAPAROSCOPY DIAGNOSTIC;  Surgeon: Robert Bellow, MD;  Location: [facility] ORS;  Service: General;  Laterality: N/A;   PORTA CATH INSERTION N/A 08/14/2018  Procedure: PORTA CATH INSERTION;  Surgeon: Algernon Huxley, MD;  Location: [facility] CV LAB;  Service: Cardiovascular;  Laterality: N/A;   TONSILECTOMY, ADENOIDECTOMY, BILATERAL MYRINGOTOMY AND TUBES  1955   TONSILLECTOMY     VENTRAL  HERNIA REPAIR N/A 03/01/2017   10 x 14 CM Ventralight ST mesh, intraperitoneal location.    VENTRAL HERNIA REPAIR N/A 03/01/2017   Procedure: HERNIA REPAIR VENTRAL ADULT;  Surgeon: Robert Bellow, MD;  Location: [facility] ORS;  Service: General;  Laterality: N/A;    SOCIAL HISTORY: Social History   Socioeconomic History   Marital status: Married    Spouse name: Johnny   Number of children: Not on file   Years of education: Not on file   Highest education level: Not on file  Occupational History   Not on file  Tobacco Use   Smoking status: Former    Packs/day: 2.00    Years: 50.00    Total pack years: 100.00    Types: Cigarettes, E-cigarettes    Quit date: 02/07/2012    Years since quitting: 9.8   Smokeless tobacco: Never  Vaping Use   Vaping Use: Former   Start date: 10/06/2013   Devices: uses no liquid  Substance and Sexual Activity   Alcohol use: No    Alcohol/week: 0.0 standard drinks of alcohol   Drug use: No   Sexual activity: Yes  Other Topics Concern   Not on file  Social History Narrative   Married   Retired   Clinical cytogeneticist level of education    No children    1 cup of coffee   Social Determinants of Health   Financial Resource Strain: Low Risk  (09/08/2020)   Overall Financial Resource Strain (CARDIA)    Difficulty of Paying Living Expenses: Not hard at all  Food Insecurity: No Food Insecurity (09/08/2020)   Hunger Vital Sign    Worried About Running Out of Food in the Last Year: Never true    [place] in the Last Year: Never true  Transportation Needs: No Transportation Needs (09/08/2020)   PRAPARE - Hydrologist (Medical): No    Lack of Transportation (Non-Medical): No  Physical Activity: Unknown (10/29/2018)   Exercise Vital Sign    Days of Exercise per Week: Patient refused    Minutes of Exercise per Session: Patient refused  Stress: No Stress Concern Present (09/08/2020)   [place]    Feeling of Stress : Not at all  Social Connections: Unknown (09/08/2020)   Social Connection and Isolation Panel [NHANES]    Frequency of Communication with Friends and Family: More than three times a week    Frequency of Social Gatherings with Friends and Family: Not on file    Attends Religious Services: Not on file    Active Member of Clubs or Organizations: Not on file    Attends Archivist Meetings: Not on file    Marital Status: Married  Intimate Partner Violence: Not At Risk (09/08/2020)   Humiliation, Afraid, Rape, and Kick questionnaire    Fear of Current or Ex-Partner: No    Emotionally Abused: No    Physically Abused: No    Sexually Abused: No    FAMILY HISTORY: Family History  Problem Relation Age of Onset   Hypertension Mother    Ovarian cancer Mother 20   Heart disease Father    Stroke Father    Ovarian cancer  Sister        ? dx cancer had hyst.   Breast cancer Neg Hx     ALLERGIES:  is allergic to bee venom.  MEDICATIONS:  Current Outpatient Medications  Medication Sig Dispense Refill   amLODipine (NORVASC) 5 MG tablet Take 1 tablet (5 mg total) by mouth daily. 90 tablet 3   buPROPion (WELLBUTRIN XL) 300 MG 24 hr tablet Take 1 tablet by mouth once daily 90 tablet 3   clobetasol cream (TEMOVATE) 5.46 % Apply 1 application topically 2 (two) times daily. Lower back 60 g 1   ibuprofen (ADVIL,MOTRIN) 200 MG tablet Take 400 mg by mouth every 8 (eight) hours as needed for headache or moderate pain.     pantoprazole (PROTONIX) 40 MG tablet TAKE 1 TABLET BY MOUTH TWICE DAILY BEFORE A MEAL 180 tablet 0   rivastigmine (EXELON) 1.5 MG capsule Take 1 capsule (1.5 mg total) by mouth 2 (two) times daily. 60 capsule 6   rosuvastatin (CRESTOR) 20 MG tablet Take 1 tablet by mouth once daily 90 tablet 0   sertraline (ZOLOFT) 100 MG tablet Take 1.5 tablets (150 mg total) by mouth daily. 503 tablet 1   folic acid (FOLVITE) 1 MG tablet  Take 1 tablet by mouth once daily (Patient not taking: Reported on 11/29/2021) 90 tablet 0   levothyroxine (SYNTHROID) 125 MCG tablet Take 1 tablet (125 mcg total) by mouth daily before breakfast. Take the pill first thing in the morning. Do not take other pills at the same time. 30 tablet 3   potassium chloride SA (KLOR-CON M) 20 MEQ tablet Take 1 tablet by mouth once daily (Patient not taking: Reported on 11/29/2021) 14 tablet 0   QUEtiapine (SEROQUEL) 25 MG tablet Take 0.5 tablets (12.5 mg total) by mouth at bedtime. (Patient not taking: Reported on 11/29/2021) 15 tablet 6   No current facility-administered medications for this visit.   Facility-Administered Medications Ordered in Other Visits  Medication Dose Route Frequency Provider Last Rate Last Admin   denosumab (XGEVA) injection 120 mg  120 mg Subcutaneous Q30 days Sindy Guadeloupe, MD   120 mg at 03/05/19 1525   heparin lock flush 100 UNIT/ML injection             PHYSICAL EXAMINATION:   Vitals:   11/29/21 1421  BP: (!) 146/80  Pulse: 77  Resp: 16  Temp: (!) 96.2 F (35.7 C)  SpO2: 95%   Filed Weights   11/29/21 1421  Weight: 170 lb 9.6 oz (77.4 kg)    Physical Exam Vitals and nursing note reviewed.  HENT:     Head: Normocephalic and atraumatic.     Mouth/Throat:     Pharynx: Oropharynx is clear.  Eyes:     Extraocular Movements: Extraocular movements intact.     Pupils: Pupils are equal, round, and reactive to light.  Cardiovascular:     Rate and Rhythm: Normal rate and regular rhythm.  Pulmonary:     Comments: Decreased breath sounds bilaterally.  Abdominal:     Palpations: Abdomen is soft.  Musculoskeletal:        General: Normal range of motion.     Cervical back: Normal range of motion.  Skin:    General: Skin is warm.  Neurological:     General: No focal deficit present.     Mental Status: She is alert and oriented to person, place, and time.  Psychiatric:        Behavior: Behavior normal.  Judgment: Judgment normal.     LABORATORY DATA:  I have reviewed the data as listed Lab Results  Component Value Date   WBC 6.2 11/29/2021   HGB 11.1 (L) 11/29/2021   HCT 35.9 (L) 11/29/2021   MCV 92.8 11/29/2021   PLT 170 11/29/2021   Recent Labs    10/18/21 0815 10/19/21 1005 11/08/21 1332 11/29/21 1340  NA 140 140 139 140  K 3.6 3.7 3.6 3.5  CL 105 103 104 106  CO2 30 30 28 29   GLUCOSE 92 89 86 94  BUN 18 20 18 18   CREATININE 1.04* 1.03* 1.23* 1.04*  CALCIUM 9.1 9.1 8.6* 8.6*  GFRNONAA 57* 57* 46* 57*  PROT 7.4  --  6.7 7.3  ALBUMIN 3.9  --  3.6 3.9  AST 29  --  30 33  ALT 18  --  21 21  ALKPHOS 95  --  91 81  BILITOT 0.5  --  0.6 0.4    RADIOGRAPHIC STUDIES: I have personally reviewed the radiological images as listed and agreed with the findings in the report. DG Hip Unilat W OR W/O Pelvis 2-3 Views Left  Result Date: 11/24/2021 CLINICAL DATA:  Left hip pain post fall EXAM: DG HIP (WITH OR WITHOUT PELVIS) 2-3V LEFT COMPARISON:  07/07/2021 FINDINGS: SI joints are patent. Pubic symphysis and rami appear intact. There are mild degenerative changes. No acute fracture or malalignment IMPRESSION: No acute osseous abnormality Electronically Signed   By: Donavan Foil M.D.   On: 11/24/2021 23:20   DG Lumbar Spine 2-3 Views  Result Date: 11/24/2021 CLINICAL DATA:  Low back pain EXAM: LUMBAR SPINE - 2-3 VIEW COMPARISON:  07/22/2021, CT 03/01/2021 FINDINGS: Levoscoliosis. Grade 1 anterolisthesis L4 on L5. Chronic compression deformity at L2. mild chronic superior endplate deformity at L2. Disc space narrowing and degenerative change L1-L2 and L5-S1. Facet degenerative changes of the lower lumbar spine. Advanced aortic atherosclerosis. IMPRESSION: 1. Scoliosis and degenerative changes. 2. Chronic compression at L2 and superior endplate of L1. Similar grade 1 anterolisthesis L4 on L5 Electronically Signed   By: Donavan Foil M.D.   On: 11/24/2021 23:19     Small cell lung  cancer, right upper lobe ([place]) # Extensive stage small cell lung cancer with bone metastases currently on Tecentriq  # Proceed with Tecentriq today.  Labs today reviewed;  acceptable for treatment today. Defer to Dr.rao for further treatment.    # Autoimmune hypothyroidism: Pt is not compliant with her thyroid medication; SEP TSH- 39; stressed compliance; sent new script.    # CKD stage III- STABLE.   # DISPOSITION: # Atezo today # keep appts as planned with Dr.Rao on Nov 15th- Dr.B..    Above plan of care was discussed with patient/family in detail.  My contact information was given to the patient/family.       Cammie Sickle, MD 11/29/2021 11:14 PM

## 2021-11-29 NOTE — Assessment & Plan Note (Addendum)
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  Labs today reviewed;  acceptable for treatment today. Defer to Dr.rao for further treatment.   # Autoimmune hypothyroidism: Pt is not compliant with her thyroid medication; SEP TSH- 39; stressed compliance; sent new script.   # CKD stage III- STABLE.   # DISPOSITION: # Atezo today # keep appts as planned with Dr.Rao on Nov 15th- Dr.B..

## 2021-11-29 NOTE — Progress Notes (Signed)
Pt of Dr Elroy Channel in for follow up, denies any concerns today.

## 2021-12-01 ENCOUNTER — Telehealth: Payer: Self-pay

## 2021-12-01 ENCOUNTER — Ambulatory Visit: Payer: PPO

## 2021-12-01 NOTE — Telephone Encounter (Signed)
No answer when called for scheduled AWV. No voicemail. Reschedule as appropriate.

## 2021-12-11 ENCOUNTER — Ambulatory Visit: Payer: PPO | Admitting: Family Medicine

## 2021-12-13 DIAGNOSIS — Z6833 Body mass index (BMI) 33.0-33.9, adult: Secondary | ICD-10-CM | POA: Diagnosis not present

## 2021-12-13 DIAGNOSIS — Z515 Encounter for palliative care: Secondary | ICD-10-CM | POA: Diagnosis not present

## 2021-12-13 DIAGNOSIS — I10 Essential (primary) hypertension: Secondary | ICD-10-CM | POA: Diagnosis not present

## 2021-12-15 ENCOUNTER — Ambulatory Visit (INDEPENDENT_AMBULATORY_CARE_PROVIDER_SITE_OTHER): Payer: PPO | Admitting: Family Medicine

## 2021-12-15 ENCOUNTER — Encounter: Payer: Self-pay | Admitting: Family Medicine

## 2021-12-15 ENCOUNTER — Telehealth: Payer: Self-pay | Admitting: Family Medicine

## 2021-12-15 VITALS — BP 130/78 | HR 68 | Temp 98.7°F | Ht 67.0 in | Wt 166.6 lb

## 2021-12-15 DIAGNOSIS — F03A18 Unspecified dementia, mild, with other behavioral disturbance: Secondary | ICD-10-CM

## 2021-12-15 DIAGNOSIS — F331 Major depressive disorder, recurrent, moderate: Secondary | ICD-10-CM

## 2021-12-15 DIAGNOSIS — R296 Repeated falls: Secondary | ICD-10-CM | POA: Diagnosis not present

## 2021-12-15 MED ORDER — SERTRALINE HCL 100 MG PO TABS: 200.0000 mg | ORAL_TABLET | Freq: Every day | ORAL | 1 refills | Status: DC

## 2021-12-15 NOTE — Patient Instructions (Signed)
Nice to see you. We are going to increase her Zoloft to 200 mg once daily to help with your depression.  If you ever have thoughts of harming yourself please seek medical attention immediately. Please use a walker at all times. We will check with home health on your physical therapy.

## 2021-12-15 NOTE — Assessment & Plan Note (Signed)
Generally stable.  I reminded her and her husband of her upcoming neurology visit.

## 2021-12-15 NOTE — Assessment & Plan Note (Signed)
Continues to be an issue.  We will increase her Zoloft to 200 mg daily.  She will continue Wellbutrin 300 mg daily.  She will follow-up in 6 weeks.  Advised to seek medical attention for active suicidal ideation.

## 2021-12-15 NOTE — Telephone Encounter (Signed)
I called and LVM for someone form [facility] home [facility] to call back to see if the patient has been having PT and how she has been doing.  I called [phone].  Jonetta Dagley,cma

## 2021-12-15 NOTE — Assessment & Plan Note (Addendum)
I will check with her home health regarding physical therapy.  Her wound on her right forearm today was cleansed with sterile saline.  Discussed rinsing with soapy water on a daily basis.  Discussed changing bandages twice a day.  Discussed use of Neosporin.  Advised against use of hydrogen peroxide given that it inhibits healing.  They were advised to seek medical attention for spreading redness, purulent drainage, or any worsening symptoms around the site of injury.

## 2021-12-15 NOTE — Telephone Encounter (Signed)
Can you contact this patient's home health company and see if she is currently getting physical therapy for her falls and balance?  If she is not I can place orders for this.  If she is getting physical therapy can you please get an update from them on how she is doing with her physical therapy.

## 2021-12-15 NOTE — Progress Notes (Signed)
Anne Rumps, MD Phone: [phone]  Anne [name] is a 71 y.o. female who presents today for follow-up.  Depression: Patient reports this is so-so though her husband notes it may be a little worse than it was previously.  She has been on Zoloft and Wellbutrin.  No active SI though her husband reports that she does wish she was dead at times given her cancer and memory issues.  Dementia: They report this is stable.  She has been following with neurology and has follow-up with them in December.  Falls: This continues to be an issue.  She had a recent fall where she hit her right forearm and had a skin tear.  She does not remember how she fell or what she was doing when she fell.  This occurred on Monday.  They did not rinse it with soap and water though have been putting Neosporin on it.  They note she has been doing physical therapy through home health.  She has not been using a walker consistently.  Social History   Tobacco Use  Smoking Status Former   Packs/day: 2.00   Years: 50.00   Total pack years: 100.00   Types: Cigarettes, E-cigarettes   Quit date: 02/07/2012   Years since quitting: 9.8  Smokeless Tobacco Never    Current Outpatient Medications on File Prior to Visit  Medication Sig Dispense Refill   amLODipine (NORVASC) 5 MG tablet Take 1 tablet (5 mg total) by mouth daily. 90 tablet 3   buPROPion (WELLBUTRIN XL) 300 MG 24 hr tablet Take 1 tablet by mouth once daily 90 tablet 3   clobetasol cream (TEMOVATE) 2.87 % Apply 1 application topically 2 (two) times daily. Lower back 60 g 1   folic acid (FOLVITE) 1 MG tablet Take 1 tablet by mouth once daily 90 tablet 0   ibuprofen (ADVIL,MOTRIN) 200 MG tablet Take 400 mg by mouth every 8 (eight) hours as needed for headache or moderate pain.     levothyroxine (SYNTHROID) 125 MCG tablet Take 1 tablet (125 mcg total) by mouth daily before breakfast. Take the pill first thing in the morning. Do not take other pills at the same time.  30 tablet 3   pantoprazole (PROTONIX) 40 MG tablet TAKE 1 TABLET BY MOUTH TWICE DAILY BEFORE A MEAL 180 tablet 0   potassium chloride SA (KLOR-CON M) 20 MEQ tablet Take 1 tablet by mouth once daily 14 tablet 0   QUEtiapine (SEROQUEL) 25 MG tablet Take 0.5 tablets (12.5 mg total) by mouth at bedtime. 15 tablet 6   rivastigmine (EXELON) 1.5 MG capsule Take 1 capsule (1.5 mg total) by mouth 2 (two) times daily. 60 capsule 6   rosuvastatin (CRESTOR) 20 MG tablet Take 1 tablet by mouth once daily 90 tablet 0   Current Facility-Administered Medications on File Prior to Visit  Medication Dose Route Frequency Provider Last Rate Last Admin   denosumab (XGEVA) injection 120 mg  120 mg Subcutaneous Q30 days Sindy Guadeloupe, MD   120 mg at 03/05/19 1525   heparin lock flush 100 UNIT/ML injection              ROS see history of present illness  Objective  Physical Exam Vitals:   12/15/21 0806  BP: 130/78  Pulse: 68  Temp: 98.7 F (37.1 C)  SpO2: 92%    BP Readings from Last 3 Encounters:  12/15/21 130/78  11/29/21 (!) 146/80  11/23/21 (!) 140/84   Wt Readings from Last 3  Encounters:  12/15/21 166 lb 9.6 oz (75.6 kg)  11/29/21 170 lb 9.6 oz (77.4 kg)  11/23/21 172 lb 8 oz (78.2 kg)    Physical Exam Constitutional:      General: She is not in acute distress.    Appearance: She is not diaphoretic.  Cardiovascular:     Rate and Rhythm: Normal rate and regular rhythm.     Heart sounds: Normal heart sounds.  Pulmonary:     Effort: Pulmonary effort is normal.     Breath sounds: Normal breath sounds.  Skin:    General: Skin is warm and dry.  Neurological:     Mental Status: She is alert.    Right forearm, no purulent drainage, no surrounding erythema, mildly tender  Assessment/Plan: Please see individual problem list.  Problem List Items Addressed This Visit     Dementia ([place]) - Primary (Chronic)    Generally stable.  I reminded her and her husband of her upcoming neurology  visit.      Relevant Medications   sertraline (ZOLOFT) 100 MG tablet   Depression (Chronic)    Continues to be an issue.  We will increase her Zoloft to 200 mg daily.  She will continue Wellbutrin 300 mg daily.  She will follow-up in 6 weeks.  Advised to seek medical attention for active suicidal ideation.      Relevant Medications   sertraline (ZOLOFT) 100 MG tablet   Frequent falls (Chronic)    I will check with her home health regarding physical therapy.  Her wound on her right forearm today was cleansed with sterile saline.  Discussed rinsing with soapy water on a daily basis.  Discussed changing bandages twice a day.  Discussed use of Neosporin.  Advised against use of hydrogen peroxide given that it inhibits healing.  They were advised to seek medical attention for spreading redness, purulent drainage, or any worsening symptoms around the site of injury.       Return in about 6 weeks (around 01/26/2022) for Falls/depression.   Anne Rumps, MD [place]

## 2021-12-15 NOTE — Telephone Encounter (Signed)
LVM with well care nurse to return a call back to me. Cleona Doubleday,cma

## 2021-12-18 DIAGNOSIS — H353211 Exudative age-related macular degeneration, right eye, with active choroidal neovascularization: Secondary | ICD-10-CM | POA: Diagnosis not present

## 2021-12-18 DIAGNOSIS — H353210 Exudative age-related macular degeneration, right eye, stage unspecified: Secondary | ICD-10-CM | POA: Diagnosis not present

## 2021-12-19 ENCOUNTER — Other Ambulatory Visit: Payer: PPO | Admitting: Nurse Practitioner

## 2021-12-19 ENCOUNTER — Other Ambulatory Visit: Payer: PPO

## 2021-12-19 DIAGNOSIS — Z515 Encounter for palliative care: Secondary | ICD-10-CM

## 2021-12-19 NOTE — Telephone Encounter (Signed)
Lvm for well care nurse to return call. See note

## 2021-12-19 NOTE — Progress Notes (Signed)
COMMUNITY PALLIATIVE CARE SW NOTE  PATIENT NAME: Anne [name] DOB: 12-10-1950 MRN: [id number]  PRIMARY CARE PROVIDER: Leone Haven, MD  RESPONSIBLE PARTY:  Acct ID - Guarantor Home Phone Work Phone Relationship Acct Type  [id number] [phone]  Self P/F     [address], [place],  [address]                                                             Palliative Care Telephone Call    PC SW connected with patient via telephone on behalf of PC NP, C. Gusler. To reschedule PC visit.  Patient shared that she is doing well and is stable and appetite is good. Patient also shared that she recently got married.  PC visit rescheduled for Tue 01/09/22 at 11am with PC RN/SW team.   SOCIAL HX:  Social History   Tobacco Use   Smoking status: Former    Packs/day: 2.00    Years: 50.00    Total pack years: 100.00    Types: Cigarettes, E-cigarettes    Quit date: 02/07/2012    Years since quitting: 9.8   Smokeless tobacco: Never  Substance Use Topics   Alcohol use: No    Alcohol/week: 0.0 standard drinks of alcohol        [place], 

## 2021-12-20 ENCOUNTER — Telehealth: Payer: Self-pay | Admitting: Family Medicine

## 2021-12-20 ENCOUNTER — Inpatient Hospital Stay: Payer: PPO | Attending: Oncology

## 2021-12-20 ENCOUNTER — Inpatient Hospital Stay (HOSPITAL_BASED_OUTPATIENT_CLINIC_OR_DEPARTMENT_OTHER): Payer: PPO | Admitting: Hospice and Palliative Medicine

## 2021-12-20 ENCOUNTER — Encounter: Payer: Self-pay | Admitting: Oncology

## 2021-12-20 ENCOUNTER — Inpatient Hospital Stay (HOSPITAL_BASED_OUTPATIENT_CLINIC_OR_DEPARTMENT_OTHER): Payer: PPO | Admitting: Oncology

## 2021-12-20 ENCOUNTER — Inpatient Hospital Stay: Payer: PPO

## 2021-12-20 VITALS — BP 128/81 | HR 78 | Temp 97.6°F | Resp 16 | Wt 167.9 lb

## 2021-12-20 DIAGNOSIS — C3411 Malignant neoplasm of upper lobe, right bronchus or lung: Secondary | ICD-10-CM | POA: Diagnosis not present

## 2021-12-20 DIAGNOSIS — E063 Autoimmune thyroiditis: Secondary | ICD-10-CM | POA: Diagnosis not present

## 2021-12-20 DIAGNOSIS — C7951 Secondary malignant neoplasm of bone: Secondary | ICD-10-CM | POA: Insufficient documentation

## 2021-12-20 DIAGNOSIS — F0393 Unspecified dementia, unspecified severity, with mood disturbance: Secondary | ICD-10-CM | POA: Diagnosis not present

## 2021-12-20 DIAGNOSIS — Z5112 Encounter for antineoplastic immunotherapy: Secondary | ICD-10-CM | POA: Diagnosis not present

## 2021-12-20 DIAGNOSIS — Z87891 Personal history of nicotine dependence: Secondary | ICD-10-CM | POA: Diagnosis not present

## 2021-12-20 DIAGNOSIS — Z8041 Family history of malignant neoplasm of ovary: Secondary | ICD-10-CM | POA: Insufficient documentation

## 2021-12-20 DIAGNOSIS — Z9221 Personal history of antineoplastic chemotherapy: Secondary | ICD-10-CM | POA: Diagnosis not present

## 2021-12-20 DIAGNOSIS — Z79890 Hormone replacement therapy (postmenopausal): Secondary | ICD-10-CM | POA: Insufficient documentation

## 2021-12-20 LAB — CBC WITH DIFFERENTIAL/PLATELET
Abs Immature Granulocytes: 0.01 10*3/uL (ref 0.00–0.07)
Basophils Absolute: 0.1 10*3/uL (ref 0.0–0.1)
Basophils Relative: 1 %
Eosinophils Absolute: 0.2 10*3/uL (ref 0.0–0.5)
Eosinophils Relative: 3 %
HCT: 41.4 % (ref 36.0–46.0)
Hemoglobin: 12.8 g/dL (ref 12.0–15.0)
Immature Granulocytes: 0 %
Lymphocytes Relative: 21 %
Lymphs Abs: 1.2 10*3/uL (ref 0.7–4.0)
MCH: 28.3 pg (ref 26.0–34.0)
MCHC: 30.9 g/dL (ref 30.0–36.0)
MCV: 91.6 fL (ref 80.0–100.0)
Monocytes Absolute: 0.3 10*3/uL (ref 0.1–1.0)
Monocytes Relative: 5 %
Neutro Abs: 3.9 10*3/uL (ref 1.7–7.7)
Neutrophils Relative %: 70 %
Platelets: 162 10*3/uL (ref 150–400)
RBC: 4.52 MIL/uL (ref 3.87–5.11)
RDW: 15.9 % — ABNORMAL HIGH (ref 11.5–15.5)
WBC: 5.5 10*3/uL (ref 4.0–10.5)
nRBC: 0 % (ref 0.0–0.2)

## 2021-12-20 LAB — COMPREHENSIVE METABOLIC PANEL
ALT: 23 U/L (ref 0–44)
AST: 30 U/L (ref 15–41)
Albumin: 4.1 g/dL (ref 3.5–5.0)
Alkaline Phosphatase: 77 U/L (ref 38–126)
Anion gap: 7 (ref 5–15)
BUN: 18 mg/dL (ref 8–23)
CO2: 30 mmol/L (ref 22–32)
Calcium: 9.1 mg/dL (ref 8.9–10.3)
Chloride: 103 mmol/L (ref 98–111)
Creatinine, Ser: 0.93 mg/dL (ref 0.44–1.00)
GFR, Estimated: >60 mL/min (ref >60)
Glucose, Bld: 93 mg/dL (ref 70–99)
Potassium: 3.7 mmol/L (ref 3.5–5.1)
Sodium: 140 mmol/L (ref 135–145)
Total Bilirubin: 0.4 mg/dL (ref 0.3–1.2)
Total Protein: 7.8 g/dL (ref 6.5–8.1)

## 2021-12-20 MED ORDER — SODIUM CHLORIDE 0.9 % IV SOLN
Freq: Once | INTRAVENOUS | Status: AC
  Filled 2021-12-20: qty 250

## 2021-12-20 MED ORDER — SODIUM CHLORIDE 0.9 % IV SOLN
1200.0000 mg | Freq: Once | INTRAVENOUS | Status: AC
  Administered 2021-12-20: 1200 mg via INTRAVENOUS
  Filled 2021-12-20: qty 20

## 2021-12-20 MED ORDER — HEPARIN SOD (PORK) LOCK FLUSH 100 UNIT/ML IV SOLN
500.0000 [IU] | Freq: Once | INTRAVENOUS | Status: AC | PRN
  Administered 2021-12-20: 500 [IU]
  Filled 2021-12-20: qty 5

## 2021-12-20 NOTE — Progress Notes (Signed)
[place] at [facility] Telephone:(336) [phone] Fax:(336) [phone]   Name: Anne [name] Date: 12/20/2021 MRN: [id number]  DOB: 03-05-1950  Patient Care Team: Leone Haven, MD as PCP - General (Family Medicine) Caryl Bis Angela Adam, MD as Consulting Physician (Family Medicine) Bary Castilla, Forest Gleason, MD (General Surgery) Telford Nab, RN as Registered Nurse Sindy Guadeloupe, MD as Consulting Physician (Hematology and Oncology)    REASON FOR CONSULTATION: Anne [name] is a 71 y.o. 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  She is also had memory loss of unclear etiology.  Patient was referred to palliative care to discuss resources and provide ongoing support.  SOCIAL HISTORY:     reports that she quit smoking about 9 years ago. Her smoking use included cigarettes and e-cigarettes. She has a 100.00 pack-year smoking history. She has never used smokeless tobacco. She reports that she does not drink alcohol and does not use drugs.  Patient is married lives at home with her husband.  ADVANCE DIRECTIVES:  Not on file  CODE STATUS:   PAST MEDICAL HISTORY: Past Medical History:  Diagnosis Date   Arthritis    Cirrhosis ([place])    Dementia ([name])    Depression    GERD (gastroesophageal reflux disease)    Hyperlipidemia    Hypertension    Lung cancer ([place])    Metastatic bone cancer     PAST SURGICAL HISTORY:  Past Surgical History:  Procedure Laterality Date   APPENDECTOMY  1971   CHOLECYSTECTOMY  1971   COLONOSCOPY WITH PROPOFOL N/A 11/15/2016   Procedure: COLONOSCOPY WITH PROPOFOL;  Surgeon: Jonathon Bellows, MD;  Location: [facility] ENDOSCOPY;  Service: Gastroenterology;  Laterality: N/A;   ENDOBRONCHIAL ULTRASOUND Right 07/30/2018   Procedure:  ENDOBRONCHIAL ULTRASOUND;  Surgeon: Laverle Hobby, MD;  Location: [facility] ORS;  Service: Pulmonary;  Laterality: Right;   ESOPHAGOGASTRODUODENOSCOPY (EGD) WITH PROPOFOL N/A 01/07/2017   Procedure: ESOPHAGOGASTRODUODENOSCOPY (EGD) WITH PROPOFOL;  Surgeon: Jonathon Bellows, MD;  Location: [facility] ENDOSCOPY;  Service: Gastroenterology;  Laterality: N/A;   LAPAROSCOPY N/A 03/01/2017   Procedure: LAPAROSCOPY DIAGNOSTIC;  Surgeon: Robert Bellow, MD;  Location: [facility] ORS;  Service: General;  Laterality: N/A;   PORTA CATH INSERTION N/A 08/14/2018   Procedure: PORTA CATH INSERTION;  Surgeon: Algernon Huxley, MD;  Location: [place] CV LAB;  Service: Cardiovascular;  Laterality: N/A;   TONSILECTOMY, ADENOIDECTOMY, BILATERAL MYRINGOTOMY AND TUBES  1955   TONSILLECTOMY     VENTRAL HERNIA REPAIR N/A 03/01/2017   10 x 14 CM Ventralight ST mesh, intraperitoneal location.    VENTRAL HERNIA REPAIR N/A 03/01/2017   Procedure: HERNIA REPAIR VENTRAL ADULT;  Surgeon: Robert Bellow, MD;  Location: [facility] ORS;  Service: General;  Laterality: N/A;    HEMATOLOGY/ONCOLOGY HISTORY:  Oncology History  Small cell lung cancer, right upper lobe ([name])  08/05/2018 Cancer Staging   Staging form: Lung, AJCC 8th Edition - Clinical stage from 08/05/2018: Stage IVB (cT3, cN2, cM1c) - Signed by Sindy Guadeloupe, MD on 08/06/2018   08/06/2018 Initial Diagnosis   Small cell lung cancer, right upper lobe ([name])   08/18/2018 - 06/07/2021 Chemotherapy   Patient is on Treatment Plan : LUNG SCLC Carboplatin + Etoposide + Atezolizumab Induction q21d / Atezolizumab Maintenance q21d     09/27/2021 -  Chemotherapy   Patient is on Treatment  Plan : LUNG SCLC Carboplatin + Etoposide + Atezolizumab Induction q21d x 4 cycles / Atezolizumab Maintenance q21d       ALLERGIES:  is allergic to bee venom.  MEDICATIONS:  Current Outpatient Medications  Medication Sig Dispense Refill   amLODipine (NORVASC) 5 MG tablet Take 1 tablet (5 mg total) by mouth  daily. 90 tablet 3   buPROPion (WELLBUTRIN XL) 300 MG 24 hr tablet Take 1 tablet by mouth once daily 90 tablet 3   clobetasol cream (TEMOVATE) 2.95 % Apply 1 application topically 2 (two) times daily. Lower back 60 g 1   folic acid (FOLVITE) 1 MG tablet Take 1 tablet by mouth once daily 90 tablet 0   ibuprofen (ADVIL,MOTRIN) 200 MG tablet Take 400 mg by mouth every 8 (eight) hours as needed for headache or moderate pain.     levothyroxine (SYNTHROID) 125 MCG tablet Take 1 tablet (125 mcg total) by mouth daily before breakfast. Take the pill first thing in the morning. Do not take other pills at the same time. 30 tablet 3   pantoprazole (PROTONIX) 40 MG tablet TAKE 1 TABLET BY MOUTH TWICE DAILY BEFORE A MEAL 180 tablet 0   potassium chloride SA (KLOR-CON M) 20 MEQ tablet Take 1 tablet by mouth once daily 14 tablet 0   QUEtiapine (SEROQUEL) 25 MG tablet Take 0.5 tablets (12.5 mg total) by mouth at bedtime. 15 tablet 6   rivastigmine (EXELON) 1.5 MG capsule Take 1 capsule (1.5 mg total) by mouth 2 (two) times daily. 60 capsule 6   rosuvastatin (CRESTOR) 20 MG tablet Take 1 tablet by mouth once daily 90 tablet 0   sertraline (ZOLOFT) 100 MG tablet Take 2 tablets (200 mg total) by mouth daily. 180 tablet 1   No current facility-administered medications for this visit.   Facility-Administered Medications Ordered in Other Visits  Medication Dose Route Frequency Provider Last Rate Last Admin   denosumab (XGEVA) injection 120 mg  120 mg Subcutaneous Q30 days Sindy Guadeloupe, MD   120 mg at 03/05/19 1525   heparin lock flush 100 UNIT/ML injection            heparin lock flush 100 unit/mL  500 Units Intracatheter Once PRN Sindy Guadeloupe, MD        VITAL SIGNS: There were no vitals taken for this visit. There were no vitals filed for this visit.  Estimated body mass index is 26.3 kg/m as calculated from the following:   Height as of 12/15/21: 5\' 7"  (1.702 m).   Weight as of an earlier encounter on  12/20/21: 167 lb 14.4 oz (76.2 kg).  LABS: CBC:    Component Value Date/Time   WBC 5.5 12/20/2021 0918   HGB 12.8 12/20/2021 0918   HCT 41.4 12/20/2021 0918   PLT 162 12/20/2021 0918   MCV 91.6 12/20/2021 0918   NEUTROABS 3.9 12/20/2021 0918   LYMPHSABS 1.2 12/20/2021 0918   MONOABS 0.3 12/20/2021 0918   EOSABS 0.2 12/20/2021 0918   BASOSABS 0.1 12/20/2021 0918   Comprehensive Metabolic Panel:    Component Value Date/Time   NA 140 12/20/2021 0918   NA 142 07/17/2021 1347   K 3.7 12/20/2021 0918   CL 103 12/20/2021 0918   CO2 30 12/20/2021 0918   BUN 18 12/20/2021 0918   BUN 12 07/17/2021 1347   CREATININE 0.93 12/20/2021 0918   CREATININE 1.09 02/26/2012 0925   GLUCOSE 93 12/20/2021 0918   CALCIUM 9.1 12/20/2021 0918   AST  30 12/20/2021 0918   ALT 23 12/20/2021 0918   ALKPHOS 77 12/20/2021 0918   BILITOT 0.4 12/20/2021 0918   BILITOT 0.4 07/17/2021 1347   PROT 7.8 12/20/2021 0918   PROT 7.1 07/17/2021 1347   ALBUMIN 4.1 12/20/2021 0918   ALBUMIN 4.5 07/17/2021 1347    RADIOGRAPHIC STUDIES: DG Hip Unilat W OR W/O Pelvis 2-3 Views Left  Result Date: 11/24/2021 CLINICAL DATA:  Left hip pain post fall EXAM: DG HIP (WITH OR WITHOUT PELVIS) 2-3V LEFT COMPARISON:  07/07/2021 FINDINGS: SI joints are patent. Pubic symphysis and rami appear intact. There are mild degenerative changes. No acute fracture or malalignment IMPRESSION: No acute osseous abnormality Electronically Signed   By: Donavan Foil M.D.   On: 11/24/2021 23:20   DG Lumbar Spine 2-3 Views  Result Date: 11/24/2021 CLINICAL DATA:  Low back pain EXAM: LUMBAR SPINE - 2-3 VIEW COMPARISON:  07/22/2021, CT 03/01/2021 FINDINGS: Levoscoliosis. Grade 1 anterolisthesis L4 on L5. Chronic compression deformity at L2. mild chronic superior endplate deformity at L2. Disc space narrowing and degenerative change L1-L2 and L5-S1. Facet degenerative changes of the lower lumbar spine. Advanced aortic atherosclerosis. IMPRESSION:  1. Scoliosis and degenerative changes. 2. Chronic compression at L2 and superior endplate of L1. Similar grade 1 anterolisthesis L4 on L5 Electronically Signed   By: Donavan Foil M.D.   On: 11/24/2021 23:19    PERFORMANCE STATUS (ECOG) : 2 - Symptomatic, <50% confined to bed  Review of Systems Unless otherwise noted, a complete review of systems is negative.  Physical Exam General: NAD Pulmonary: Unlabored Extremities: no edema, no joint deformities Skin: no rashes Neurological: Weakness but otherwise nonfocal  IMPRESSION: Patient seen in infusion.  She has underlying dementia and so is a poor historian.  She does endorse a recent fall but cannot recall any details.  She says that she has both a walker and cane at home but does not use them.  She thinks that home health might be involved but also cannot really recall the details.  I called to speak with her husband but he did not answer.  We will refer back to community palliative care to see if they can help coordinate home resources.  PLAN: -Continue current scope of treatment -Referral to community palliative care -Follow-up telephone visit 1-2 months   Patient expressed understanding and was in agreement with this plan. She also understands that She can call the clinic at any time with any questions, concerns, or complaints.     Time Total: 15 minutes  Visit consisted of counseling and education dealing with the complex and emotionally intense issues of symptom management and palliative care in the setting of serious and potentially life-threatening illness.Greater than 50%  of this time was spent counseling and coordinating care related to the above assessment and plan.  Signed by: Altha Harm, PhD, NP-C

## 2021-12-20 NOTE — Telephone Encounter (Signed)
Copied from [place] [id number]. Topic: Medicare AWV >> Dec 20, 2021 11:21 AM Devoria Glassing wrote: Reason for CRM: Attempted to schedule AWV. Unable to LVM.  Will try at later time.

## 2021-12-20 NOTE — Telephone Encounter (Signed)
Could not reach the home health nurse 4 messages were left on her VM.  Christabelle Hanzlik,cma

## 2021-12-20 NOTE — Progress Notes (Signed)
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  She has been referred to [place] for findings of bone metastases and her recent MRI.  She has a prior history of 3 packs/day day smoking for over 45 years and quit smoking 5 years ago.She had a CT chest abdomen pelvis in 2018 which showed a 5 mm lung nodule in the left lower lobe.  Recently over the last 2 months patient has been having worsening back pain and was referred to orthopedics.  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  Pathologic fracture of S1.   PET scan showed 2 RUL lung nodules, hilar and mediastinal adenopathy and widespread bone mets. MRI brain negative.   Patient completed palliative RT to her spine. Bronchoscopy showed small cell lung cancer.  Palliative carboplatin, etoposide and Tecentriq started on 08/18/2018. Scans after 4 cycles showed stable disease. 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  She reports being compliant but her  values fluctuate widely.    Interval history-reports being compliant with levothyroxine.  States that her husband keeps her medications ready for her.  Denies any recent falls.  Denies any nausea vomiting or diarrhea  ECOG PS- 1 Pain scale- 0   Review of systems- Review of Systems  Constitutional:  Negative for chills, fever, malaise/fatigue and weight loss.  HENT:  Negative for congestion, ear discharge and nosebleeds.   Eyes:  Negative for blurred vision.  Respiratory:  Negative for cough, hemoptysis, sputum production, shortness of breath and wheezing.   Cardiovascular:  Negative for chest pain, palpitations, orthopnea and claudication.  Gastrointestinal:  Negative for abdominal pain, blood in stool, constipation, diarrhea, heartburn, melena, nausea and vomiting.  Genitourinary:  Negative for dysuria, flank pain, frequency, hematuria and urgency.  Musculoskeletal:  Negative for back pain, joint pain and myalgias.  Skin:  Negative for rash.  Neurological:  Negative for dizziness, tingling, focal weakness, seizures, weakness and headaches.  Endo/Heme/Allergies:  Does not bruise/bleed easily.  Psychiatric/Behavioral:  Negative for depression and suicidal ideas. The patient does not have insomnia.       Allergies  Allergen Reactions   Bee Venom Swelling     Past Medical History:  Diagnosis Date   Arthritis    Cirrhosis ([place])    Dementia ([place])    Depression    GERD (gastroesophageal reflux disease)    Hyperlipidemia    Hypertension    Lung cancer ([place])    Metastatic bone cancer      Past Surgical History:  Procedure Laterality Date   APPENDECTOMY  1971   CHOLECYSTECTOMY  1971   COLONOSCOPY WITH PROPOFOL  N/A 11/15/2016   Procedure: COLONOSCOPY WITH PROPOFOL;  Surgeon: Jonathon Bellows, MD;  Location: [facility] ENDOSCOPY;  Service: Gastroenterology;  Laterality: N/A;   ENDOBRONCHIAL ULTRASOUND Right 07/30/2018   Procedure: ENDOBRONCHIAL ULTRASOUND;  Surgeon: Laverle Hobby,  MD;  Location: [facility] ORS;  Service: Pulmonary;  Laterality: Right;   ESOPHAGOGASTRODUODENOSCOPY (EGD) WITH PROPOFOL N/A 01/07/2017   Procedure: ESOPHAGOGASTRODUODENOSCOPY (EGD) WITH PROPOFOL;  Surgeon: Jonathon Bellows, MD;  Location: [facility] ENDOSCOPY;  Service: Gastroenterology;  Laterality: N/A;   LAPAROSCOPY N/A 03/01/2017   Procedure: LAPAROSCOPY DIAGNOSTIC;  Surgeon: Robert Bellow, MD;  Location: [facility] ORS;  Service: General;  Laterality: N/A;   PORTA CATH INSERTION N/A 08/14/2018   Procedure: PORTA CATH INSERTION;  Surgeon: Algernon Huxley, MD;  Location: [place] CV LAB;  Service: Cardiovascular;  Laterality: N/A;   TONSILECTOMY, ADENOIDECTOMY, BILATERAL MYRINGOTOMY AND TUBES  1955   TONSILLECTOMY     VENTRAL HERNIA REPAIR N/A 03/01/2017   10 x 14 CM Ventralight ST mesh, intraperitoneal location.    VENTRAL HERNIA REPAIR N/A 03/01/2017   Procedure: HERNIA REPAIR VENTRAL ADULT;  Surgeon: Robert Bellow, MD;  Location: [facility] ORS;  Service: General;  Laterality: N/A;    Social History   Socioeconomic History   Marital status: Married    Spouse name: Johnny   Number of children: Not on file   Years of education: Not on file   Highest education level: Not on file  Occupational History   Not on file  Tobacco Use   Smoking status: Former    Packs/day: 2.00    Years: 50.00    Total pack years: 100.00    Types: Cigarettes, E-cigarettes    Quit date: 02/07/2012    Years since quitting: 9.8   Smokeless tobacco: Never  Vaping Use   Vaping Use: Former   Start date: 10/06/2013   Devices: uses no liquid  Substance and Sexual Activity   Alcohol use: No    Alcohol/week: 0.0 standard drinks of alcohol   Drug use: No   Sexual activity: Yes  Other Topics Concern   Not on file  Social History Narrative   Married   Retired   Clinical cytogeneticist level of education    No children    1 cup of coffee   Social Determinants of Health   Financial Resource Strain: Low Risk  (09/08/2020)    Overall Financial Resource Strain (CARDIA)    Difficulty of Paying Living Expenses: Not hard at all  Food Insecurity: No Food Insecurity (09/08/2020)   Hunger Vital Sign    Worried About Running Out of Food in the Last Year: Never true    [place] in the Last Year: Never true  Transportation Needs: No Transportation Needs (09/08/2020)   PRAPARE - Hydrologist (Medical): No    Lack of Transportation (Non-Medical): No  Physical Activity: Unknown (10/29/2018)   Exercise Vital Sign    Days of Exercise per Week: Patient refused    Minutes of Exercise per Session: Patient refused  Stress: No Stress Concern Present (09/08/2020)   [place]    Feeling of Stress : Not at all  Social Connections: Unknown (09/08/2020)   Social Connection and Isolation Panel [NHANES]    Frequency of Communication with Friends and Family: More than three times a week    Frequency of Social Gatherings with Friends and Family: Not on file    Attends Religious Services: Not on  file    Active Member of Clubs or Organizations: Not on file    Attends Club or Organization Meetings: Not on file    Marital Status: Married  Intimate Partner Violence: Not At Risk (09/08/2020)   Humiliation, Afraid, Rape, and Kick questionnaire    Fear of Current or Ex-Partner: No    Emotionally Abused: No    Physically Abused: No    Sexually Abused: No    Family History  Problem Relation Age of Onset   Hypertension Mother    Ovarian cancer Mother 78   Heart disease Father    Stroke Father    Ovarian cancer Sister        ? dx cancer had hyst.   Breast cancer Neg Hx      Current Outpatient Medications:    amLODipine (NORVASC) 5 MG tablet, Take 1 tablet (5 mg total) by mouth daily., Disp: 90 tablet, Rfl: 3   buPROPion (WELLBUTRIN XL) 300 MG 24 hr tablet, Take 1 tablet by mouth once daily, Disp: 90 tablet, Rfl: 3   folic acid (FOLVITE) 1 MG  tablet, Take 1 tablet by mouth once daily, Disp: 90 tablet, Rfl: 0   levothyroxine (SYNTHROID) 125 MCG tablet, Take 1 tablet (125 mcg total) by mouth daily before breakfast. Take the pill first thing in the morning. Do not take other pills at the same time., Disp: 30 tablet, Rfl: 3   pantoprazole (PROTONIX) 40 MG tablet, TAKE 1 TABLET BY MOUTH TWICE DAILY BEFORE A MEAL, Disp: 180 tablet, Rfl: 0   potassium chloride SA (KLOR-CON M) 20 MEQ tablet, Take 1 tablet by mouth once daily, Disp: 14 tablet, Rfl: 0   QUEtiapine (SEROQUEL) 25 MG tablet, Take 0.5 tablets (12.5 mg total) by mouth at bedtime., Disp: 15 tablet, Rfl: 6   rivastigmine (EXELON) 1.5 MG capsule, Take 1 capsule (1.5 mg total) by mouth 2 (two) times daily., Disp: 60 capsule, Rfl: 6   rosuvastatin (CRESTOR) 20 MG tablet, Take 1 tablet by mouth once daily, Disp: 90 tablet, Rfl: 0   sertraline (ZOLOFT) 100 MG tablet, Take 2 tablets (200 mg total) by mouth daily., Disp: 180 tablet, Rfl: 1   clobetasol cream (TEMOVATE) 5.78 %, Apply 1 application topically 2 (two) times daily. Lower back, Disp: 60 g, Rfl: 1   ibuprofen (ADVIL,MOTRIN) 200 MG tablet, Take 400 mg by mouth every 8 (eight) hours as needed for headache or moderate pain., Disp: , Rfl:  No current facility-administered medications for this visit.  Facility-Administered Medications Ordered in Other Visits:    denosumab (XGEVA) injection 120 mg, 120 mg, Subcutaneous, Q30 days, Sindy Guadeloupe, MD, 120 mg at 03/05/19 1525   heparin lock flush 100 UNIT/ML injection, , , ,   Physical exam:  Vitals:   12/20/21 0932  BP: 128/81  Pulse: 78  Resp: 16  Temp: 97.6 F (36.4 C)  SpO2: 97%  Weight: 167 lb 14.4 oz (76.2 kg)   Physical Exam Constitutional:      General: She is not in acute distress. Cardiovascular:     Rate and Rhythm: Normal rate and regular rhythm.     Heart sounds: Normal heart sounds.  Pulmonary:     Effort: Pulmonary effort is normal.     Breath sounds: Normal  breath sounds.  Abdominal:     General: Bowel sounds are normal.     Palpations: Abdomen is soft.     Tenderness: There is abdominal tenderness.  Skin:    General: Skin is  warm and dry.  Neurological:     Mental Status: She is alert.     Comments: Oriented to self and place         Latest Ref Rng & Units 12/20/2021    9:18 AM  CMP  Glucose 70 - 99 mg/dL 93   BUN 8 - 23 mg/dL 18   Creatinine 0.44 - 1.00 mg/dL 0.93   Sodium 135 - 145 mmol/L 140   Potassium 3.5 - 5.1 mmol/L 3.7   Chloride 98 - 111 mmol/L 103   CO2 22 - 32 mmol/L 30   Calcium 8.9 - 10.3 mg/dL 9.1   Total Protein 6.5 - 8.1 g/dL 7.8   Total Bilirubin 0.3 - 1.2 mg/dL 0.4   Alkaline Phos 38 - 126 U/L 77   AST 15 - 41 U/L 30   ALT 0 - 44 U/L 23       Latest Ref Rng & Units 12/20/2021    9:18 AM  CBC  WBC 4.0 - 10.5 K/uL 5.5   Hemoglobin 12.0 - 15.0 g/dL 12.8   Hematocrit 36.0 - 46.0 % 41.4   Platelets 150 - 400 K/uL 162     No images are attached to the encounter.  DG Hip Unilat W OR W/O Pelvis 2-3 Views Left  Result Date: 11/24/2021 CLINICAL DATA:  Left hip pain post fall EXAM: DG HIP (WITH OR WITHOUT PELVIS) 2-3V LEFT COMPARISON:  07/07/2021 FINDINGS: SI joints are patent. Pubic symphysis and rami appear intact. There are mild degenerative changes. No acute fracture or malalignment IMPRESSION: No acute osseous abnormality Electronically Signed   By: Donavan Foil M.D.   On: 11/24/2021 23:20   DG Lumbar Spine 2-3 Views  Result Date: 11/24/2021 CLINICAL DATA:  Low back pain EXAM: LUMBAR SPINE - 2-3 VIEW COMPARISON:  07/22/2021, CT 03/01/2021 FINDINGS: Levoscoliosis. Grade 1 anterolisthesis L4 on L5. Chronic compression deformity at L2. mild chronic superior endplate deformity at L2. Disc space narrowing and degenerative change L1-L2 and L5-S1. Facet degenerative changes of the lower lumbar spine. Advanced aortic atherosclerosis. IMPRESSION: 1. Scoliosis and degenerative changes. 2. Chronic compression at L2  and superior endplate of L1. Similar grade 1 anterolisthesis L4 on L5 Electronically Signed   By: Donavan Foil M.D.   On: 11/24/2021 23:19     Assessment and plan- Patient is a 71 y.o. 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  She is here for on treatment assessment prior to next cycle of Tecentriq  Counts okay to proceed with cycle 51 of Tecentriq today.  I will see her back in 3 weeks for the following cycle.  I will plan to get CT chest abdomen pelvis with contrast sometime in 2 weeks time.  Autoimmune hypothyroidism: TSH is improving but is still elevated.  We will check TSH again at next visit.  I am not making any quick changes to her levothyroxine dosing given her questionable compliance with medications.   Visit Diagnosis 1. Small cell lung cancer, right upper lobe ([place])   2. Encounter for antineoplastic immunotherapy   3. Autoimmune hypothyroidism      Dr. Randa Evens, MD, MPH [facility] at [facility] [place] [phone] 12/20/2021 12:11 PM

## 2021-12-21 NOTE — Telephone Encounter (Signed)
Noted. We can wait on a call back at this point.

## 2022-01-01 ENCOUNTER — Ambulatory Visit
Admission: RE | Admit: 2022-01-01 | Discharge: 2022-01-01 | Disposition: A | Discharge status: Home / Self Care | Payer: PPO | Source: Ambulatory Visit | Attending: Oncology | Admitting: Oncology

## 2022-01-01 ENCOUNTER — Ambulatory Visit: Payer: PPO | Admitting: Family Medicine

## 2022-01-01 DIAGNOSIS — C3411 Malignant neoplasm of upper lobe, right bronchus or lung: Secondary | ICD-10-CM | POA: Diagnosis not present

## 2022-01-01 DIAGNOSIS — C3490 Malignant neoplasm of unspecified part of unspecified bronchus or lung: Secondary | ICD-10-CM | POA: Diagnosis not present

## 2022-01-01 DIAGNOSIS — N858 Other specified noninflammatory disorders of uterus: Secondary | ICD-10-CM | POA: Diagnosis not present

## 2022-01-01 DIAGNOSIS — S2241XD Multiple fractures of ribs, right side, subsequent encounter for fracture with routine healing: Secondary | ICD-10-CM | POA: Diagnosis not present

## 2022-01-01 DIAGNOSIS — I77811 Abdominal aortic ectasia: Secondary | ICD-10-CM | POA: Diagnosis not present

## 2022-01-01 DIAGNOSIS — C7951 Secondary malignant neoplasm of bone: Secondary | ICD-10-CM | POA: Diagnosis not present

## 2022-01-01 DIAGNOSIS — J432 Centrilobular emphysema: Secondary | ICD-10-CM | POA: Diagnosis not present

## 2022-01-01 MED ORDER — IOHEXOL 300 MG/ML  SOLN
100.0000 mL | Freq: Once | INTRAMUSCULAR | Status: AC | PRN
  Administered 2022-01-01: 100 mL via INTRAVENOUS

## 2022-01-09 ENCOUNTER — Other Ambulatory Visit: Payer: PPO

## 2022-01-09 VITALS — BP 156/98 | HR 76 | Temp 97.6°F | Ht 67.0 in | Wt 164.6 lb

## 2022-01-09 DIAGNOSIS — Z515 Encounter for palliative care: Secondary | ICD-10-CM

## 2022-01-09 NOTE — Progress Notes (Signed)
PATIENT NAME: Anne [name] DOB: [birth year] MRN: [id number]  PRIMARY CARE PROVIDER: Leone Haven, MD  RESPONSIBLE PARTY:  Acct ID - Guarantor Home Phone Work Phone Relationship Acct Type  [id number] [phone]  Self P/F     [address] [place], [place] [address]    ACP:  Patient endorses having a will in place.   Discussion on code status and patient desires to remain a full code status.   Appetite:  Patient endorses a good appetite.  She is cooking meals at home.  Weight obtained on today's visit  164.6 lbs.  3 lb weight loss noted in the last month.  Dementia:  Patient does not feel there is a decline in her memory.  Sleeping well.  Continues to be fairly independent with activities of daily living.   Answers questions appropriately.  Repetitive in conversation.  Continues with [employer].   Mobility:  Patient has an unsteady gait.  Last fall in the kitchen about 3 weeks ago.  Has a cane but does not utilize this.   Typically spouse will walk with her to ensure safety.  Education provided on safety.   Small Cell Lung Cancer:  Continues with monthly treatments of Tecentriq.  Patient denies any side effects from treatment other than ongoing fatigue.  Patient denies any shortness of breath and no oxygen use in place.   Visit Frequency:   Patient will continue with monthly visits.   CODE STATUS: Full ADVANCED DIRECTIVES: No MOST FORM: No PPS: 50%   PHYSICAL EXAM:   VITALS: Today's Vitals   01/09/22 1123  BP: (!) 156/98  Pulse: 76  Temp: 97.6 F (36.4 C)  SpO2: 99%  PainSc: 0-No pain    LUNGS: clear to auscultation  CARDIAC: Cor RRR}  EXTREMITIES: negative SKIN: Skin color, texture, turgor normal. No rashes or lesions or normal  NEURO: positive for gait problems       Lorenza Burton, RN

## 2022-01-09 NOTE — Progress Notes (Signed)
COMMUNITY PALLIATIVE CARE SW NOTE  PATIENT NAME: Anne [name] DOB: 1950/02/20 MRN: [id number]  PRIMARY CARE PROVIDER: Leone Haven, MD  RESPONSIBLE PARTY:  Acct ID - Guarantor Home Phone Work Phone Relationship Acct Type  [id number] [phone]  Self P/F     [address], [place], [place] [address]     PLAN OF CARE and INTERVENTIONS:             GOALS OF CARE/ ADVANCE CARE PLANNING:  Goals include to maximize quality of life and symptom management. Our advance care planning conversation included a discussion about:    The value and importance of advance care planning  Review and updating or creation of an advance directive document.                          Patient is a FULL CODE. Patient has a will but has no other ACP in place.  2.                Palliative care encounter: SW and RN completed joint in home visit with patient.   Functional changes/updates: patient with small lung cell cancer. Patient receives infusions once a month. Per patient she is doing well. No complaints of pain, no falls reported, appetite is good, per patient she is eating 2 meals a day current weight is 164.6.  Patient ambulates w/o AD, but has a SPC.      Psychosocial assessment: completed.   In home support: patient resides with spouse.  Transportation: no needs.  Food: no food insecurities witnessed.   Safety and long term planning: patient feels safe in his home and desires to remain in her home. Life alert system discussed.   SW discussed goals, reviewed care plan, provided emotional support, used active and reflective listening in the form of reciprocity emotional response. Questions and concerns were addressed. The patient/family was encouraged to call with any additional questions and/or concerns. PC Provided general support and encouragement, no other unmet needs identified. Will continue to follow.   3.         PATIENT/CAREGIVER EDUCATION/ COPING:   Appearance: well  groomed, appropriate given situation  Mental Status: Alert/oriented. Eye Contact: Good. Able to engage in proper eye contact  Thought Process: rational  Thought Content: not assessed  Speech: normal  Mood: Normal and calm Affect: Congruent to endorsed mood, full ranging Insight: normal Judgement: normal  Interaction Style: Cooperative   Patient A&O, patient engaged in fluent conversation and answered all questions appropriately. Cognitive deficits witnessed, as patient has dementia dx. Patient with hearing deficit as well.    4.         PERSONAL EMERGENCY PLAN:  Patient/family will call 9-1-1 for emergencies.    5.         COMMUNITY RESOURCES COORDINATION/ HEALTH CARE NAVIGATION:  patient and spouse manages her care.    6.      FINANCIAL CONCERNS/NEEDS: None. Patient does not qualify for community Medicaid.                         Primary Health Insurance:  HTA Medicare Secondary Health Insurance: none Prescription Coverage: Yes, no history of difficulty obtaining or affording prescriptions reported.     SOCIAL HX:  Social History   Tobacco Use   Smoking status: Former    Packs/day: 2.00    Years: 50.00    Total pack years: 100.00  Types: Cigarettes, E-cigarettes    Quit date: 02/07/2012    Years since quitting: 9.9   Smokeless tobacco: Never  Substance Use Topics   Alcohol use: No    Alcohol/week: 0.0 standard drinks of alcohol    CODE STATUS: FULL CODE ADVANCED DIRECTIVES: N MOST FORM COMPLETE:  N HOSPICE EDUCATION PROVIDED: N  PPS: patient is independent with ADL's.   Time spent: 50 min       [place], [place]

## 2022-01-10 ENCOUNTER — Inpatient Hospital Stay: Payer: PPO

## 2022-01-10 ENCOUNTER — Inpatient Hospital Stay (HOSPITAL_BASED_OUTPATIENT_CLINIC_OR_DEPARTMENT_OTHER): Payer: PPO | Admitting: Oncology

## 2022-01-10 ENCOUNTER — Encounter: Payer: Self-pay | Admitting: Oncology

## 2022-01-10 ENCOUNTER — Inpatient Hospital Stay: Payer: PPO | Attending: Oncology

## 2022-01-10 VITALS — BP 122/84 | HR 82 | Temp 97.5°F | Resp 16 | Wt 166.6 lb

## 2022-01-10 DIAGNOSIS — Z9221 Personal history of antineoplastic chemotherapy: Secondary | ICD-10-CM | POA: Diagnosis not present

## 2022-01-10 DIAGNOSIS — Z8041 Family history of malignant neoplasm of ovary: Secondary | ICD-10-CM | POA: Insufficient documentation

## 2022-01-10 DIAGNOSIS — E063 Autoimmune thyroiditis: Secondary | ICD-10-CM | POA: Diagnosis not present

## 2022-01-10 DIAGNOSIS — C7951 Secondary malignant neoplasm of bone: Secondary | ICD-10-CM | POA: Diagnosis not present

## 2022-01-10 DIAGNOSIS — C3411 Malignant neoplasm of upper lobe, right bronchus or lung: Secondary | ICD-10-CM

## 2022-01-10 DIAGNOSIS — Z79890 Hormone replacement therapy (postmenopausal): Secondary | ICD-10-CM | POA: Diagnosis not present

## 2022-01-10 DIAGNOSIS — F0393 Unspecified dementia, unspecified severity, with mood disturbance: Secondary | ICD-10-CM | POA: Diagnosis not present

## 2022-01-10 DIAGNOSIS — Z5112 Encounter for antineoplastic immunotherapy: Secondary | ICD-10-CM

## 2022-01-10 DIAGNOSIS — Z87891 Personal history of nicotine dependence: Secondary | ICD-10-CM | POA: Diagnosis not present

## 2022-01-10 LAB — CBC WITH DIFFERENTIAL/PLATELET
Abs Immature Granulocytes: 0.01 10*3/uL (ref 0.00–0.07)
Basophils Absolute: 0 10*3/uL (ref 0.0–0.1)
Basophils Relative: 1 %
Eosinophils Absolute: 0.1 10*3/uL (ref 0.0–0.5)
Eosinophils Relative: 2 %
HCT: 40 % (ref 36.0–46.0)
Hemoglobin: 12.5 g/dL (ref 12.0–15.0)
Immature Granulocytes: 0 %
Lymphocytes Relative: 20 %
Lymphs Abs: 1.1 10*3/uL (ref 0.7–4.0)
MCH: 28.2 pg (ref 26.0–34.0)
MCHC: 31.3 g/dL (ref 30.0–36.0)
MCV: 90.1 fL (ref 80.0–100.0)
Monocytes Absolute: 0.4 10*3/uL (ref 0.1–1.0)
Monocytes Relative: 7 %
Neutro Abs: 3.8 10*3/uL (ref 1.7–7.7)
Neutrophils Relative %: 70 %
Platelets: 148 10*3/uL — ABNORMAL LOW (ref 150–400)
RBC: 4.44 MIL/uL (ref 3.87–5.11)
RDW: 15.4 % (ref 11.5–15.5)
WBC: 5.4 10*3/uL (ref 4.0–10.5)
nRBC: 0 % (ref 0.0–0.2)

## 2022-01-10 LAB — COMPREHENSIVE METABOLIC PANEL
ALT: 22 U/L (ref 0–44)
AST: 29 U/L (ref 15–41)
Albumin: 3.9 g/dL (ref 3.5–5.0)
Alkaline Phosphatase: 72 U/L (ref 38–126)
Anion gap: 9 (ref 5–15)
BUN: 15 mg/dL (ref 8–23)
CO2: 29 mmol/L (ref 22–32)
Calcium: 8.9 mg/dL (ref 8.9–10.3)
Chloride: 103 mmol/L (ref 98–111)
Creatinine, Ser: 1.05 mg/dL — ABNORMAL HIGH (ref 0.44–1.00)
GFR, Estimated: 56 mL/min — ABNORMAL LOW (ref >60)
Glucose, Bld: 104 mg/dL — ABNORMAL HIGH (ref 70–99)
Potassium: 3.4 mmol/L — ABNORMAL LOW (ref 3.5–5.1)
Sodium: 141 mmol/L (ref 135–145)
Total Bilirubin: 0.6 mg/dL (ref 0.3–1.2)
Total Protein: 7.2 g/dL (ref 6.5–8.1)

## 2022-01-10 LAB — TSH: TSH: 10.841 u[IU]/mL — ABNORMAL HIGH (ref 0.350–4.500)

## 2022-01-10 MED ORDER — SODIUM CHLORIDE 0.9 % IV SOLN
Freq: Once | INTRAVENOUS | Status: AC
  Filled 2022-01-10: qty 250

## 2022-01-10 MED ORDER — SODIUM CHLORIDE 0.9% FLUSH
10.0000 mL | INTRAVENOUS | Status: DC | PRN
  Administered 2022-01-10: 10 mL
  Filled 2022-01-10: qty 10

## 2022-01-10 MED ORDER — SODIUM CHLORIDE 0.9 % IV SOLN
1200.0000 mg | Freq: Once | INTRAVENOUS | Status: AC
  Administered 2022-01-10: 1200 mg via INTRAVENOUS
  Filled 2022-01-10: qty 20

## 2022-01-10 MED ORDER — HEPARIN SOD (PORK) LOCK FLUSH 100 UNIT/ML IV SOLN
500.0000 [IU] | Freq: Once | INTRAVENOUS | Status: AC | PRN
  Administered 2022-01-10: 500 [IU]
  Filled 2022-01-10: qty 5

## 2022-01-10 MED ORDER — SODIUM CHLORIDE 0.9% FLUSH
10.0000 mL | INTRAVENOUS | Status: DC | PRN
  Administered 2022-01-10: 10 mL via INTRAVENOUS
  Filled 2022-01-10: qty 10

## 2022-01-10 NOTE — Patient Instructions (Signed)
[facility] AT [place]  Discharge Instructions: Thank you for choosing [facility] to provide your oncology and hematology care.  If you have a lab appointment with the [facility], please go directly to the [facility] and check in at the registration area.  Wear comfortable clothing and clothing appropriate for easy access to any Portacath or PICC line.   We strive to give you quality time with your provider. You may need to reschedule your appointment if you arrive late (15 or more minutes).  Arriving late affects you and other patients whose appointments are after yours.  Also, if you miss three or more appointments without notifying the office, you may be dismissed from the clinic at the provider's discretion.      For prescription refill requests, have your pharmacy contact our office and allow 72 hours for refills to be completed.    Today you received the following chemotherapy and/or immunotherapy agents- Tecentriq      To help prevent nausea and vomiting after your treatment, we encourage you to take your nausea medication as directed.  BELOW ARE SYMPTOMS THAT SHOULD BE REPORTED IMMEDIATELY: *FEVER GREATER THAN 100.4 F (38 C) OR HIGHER *CHILLS OR SWEATING *NAUSEA AND VOMITING THAT IS NOT CONTROLLED WITH YOUR NAUSEA MEDICATION *UNUSUAL SHORTNESS OF BREATH *UNUSUAL BRUISING OR BLEEDING *URINARY PROBLEMS (pain or burning when urinating, or frequent urination) *BOWEL PROBLEMS (unusual diarrhea, constipation, pain near the anus) TENDERNESS IN MOUTH AND THROAT WITH OR WITHOUT PRESENCE OF ULCERS (sore throat, sores in mouth, or a toothache) UNUSUAL RASH, SWELLING OR PAIN  UNUSUAL VAGINAL DISCHARGE OR ITCHING   Items with * indicate a potential emergency and should be followed up as soon as possible or go to the Emergency Department if any problems should occur.  Please show the CHEMOTHERAPY ALERT CARD or IMMUNOTHERAPY ALERT CARD at check-in to  the Emergency Department and triage nurse.  Should you have questions after your visit or need to cancel or reschedule your appointment, please contact [facility] CANCER [place] AT [place]  [phone] and follow the prompts.  Office hours are 8:00 a.m. to 4:30 p.m. Monday - Friday. Please note that voicemails left after 4:00 p.m. may not be returned until the following business day.  We are closed weekends and major holidays. You have access to a nurse at all times for urgent questions. Please call the main number to the clinic [phone] and follow the prompts.  For any non-urgent questions, you may also contact your provider using MyChart. We now offer e-Visits for anyone 73 and older to request care online for non-urgent symptoms. For details visit [email or web address].   Also download the MyChart app! Go to the app store, search "MyChart", open the app, select [facility], and log in with your MyChart username and password.  Masks are optional in the cancer centers. If you would like for your care team to wear a mask while they are taking care of you, please let them know. For doctor visits, patients may have with them one support person who is at least 73 years old. At this time, visitors are not allowed in the infusion area.

## 2022-01-10 NOTE — Progress Notes (Signed)
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  She has been referred to [place] for findings of bone metastases and her recent MRI.  She has a prior history of 3 packs/day day smoking for over 45 years and quit smoking 5 years ago.She had a CT chest abdomen pelvis in 2018 which showed a 5 mm lung nodule in the left lower lobe.  Recently over the last 2 months patient has been having worsening back pain and was referred to orthopedics.  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  Pathologic fracture of S1.   PET scan showed 2 RUL lung nodules, hilar and mediastinal adenopathy and widespread bone mets. MRI brain negative.   Patient completed palliative RT to her spine. Bronchoscopy showed small cell lung cancer.  Palliative carboplatin, etoposide and Tecentriq started on 08/18/2018. Scans after 4 cycles showed stable disease. 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  She reports being compliant but her  values fluctuate widely.    Interval history-patient reports doing well presently.  Denies any specific side effects at this time.  ECOG PS- 1 Pain scale- 0   Review of systems- Review of Systems  Constitutional:  Negative for chills, fever, malaise/fatigue and weight loss.  HENT:  Negative for congestion, ear discharge and nosebleeds.   Eyes:  Negative for blurred vision.  Respiratory:  Negative for cough, hemoptysis, sputum production, shortness of breath and wheezing.   Cardiovascular:  Negative for chest pain, palpitations, orthopnea and claudication.  Gastrointestinal:  Negative for abdominal pain, blood in stool, constipation, diarrhea, heartburn, melena, nausea and vomiting.  Genitourinary:  Negative for dysuria, flank pain, frequency, hematuria and urgency.  Musculoskeletal:  Negative for back pain, joint pain and myalgias.  Skin:  Negative for rash.  Neurological:  Negative for dizziness, tingling, focal weakness, seizures, weakness and headaches.  Endo/Heme/Allergies:  Does not bruise/bleed easily.  Psychiatric/Behavioral:  Negative for depression and suicidal ideas. The patient does not have insomnia.       Allergies  Allergen Reactions   Bee Venom Swelling     Past Medical History:  Diagnosis Date   Arthritis    Cirrhosis ([place])    Dementia ([place])    Depression    GERD (gastroesophageal reflux disease)    Hyperlipidemia    Hypertension    Lung cancer ([place])    Metastatic bone cancer      Past Surgical History:  Procedure Laterality Date   APPENDECTOMY  1971   CHOLECYSTECTOMY  1971   COLONOSCOPY WITH PROPOFOL N/A 11/15/2016   Procedure: COLONOSCOPY WITH PROPOFOL;  Surgeon: Jonathon Bellows, MD;  Location: [facility] ENDOSCOPY;  Service: Gastroenterology;  Laterality: N/A;   ENDOBRONCHIAL ULTRASOUND Right 07/30/2018   Procedure: ENDOBRONCHIAL ULTRASOUND;  Surgeon: Laverle Hobby, MD;  Location: [facility] ORS;  Service: Pulmonary;  Laterality: Right;    ESOPHAGOGASTRODUODENOSCOPY (EGD) WITH PROPOFOL N/A 01/07/2017   Procedure: ESOPHAGOGASTRODUODENOSCOPY (EGD) WITH PROPOFOL;  Surgeon: Jonathon Bellows, MD;  Location: [facility] ENDOSCOPY;  Service: Gastroenterology;  Laterality: N/A;   LAPAROSCOPY N/A 03/01/2017   Procedure: LAPAROSCOPY DIAGNOSTIC;  Surgeon: Robert Bellow, MD;  Location: [facility] ORS;  Service: General;  Laterality: N/A;   PORTA CATH INSERTION N/A 08/14/2018   Procedure: PORTA CATH INSERTION;  Surgeon: Algernon Huxley, MD;  Location: [facility];  Service: Cardiovascular;  Laterality: N/A;   TONSILECTOMY, ADENOIDECTOMY, BILATERAL MYRINGOTOMY AND TUBES  1955   TONSILLECTOMY     VENTRAL HERNIA REPAIR N/A 03/01/2017   10 x 14 CM Ventralight ST mesh, intraperitoneal location.    VENTRAL HERNIA REPAIR N/A 03/01/2017   Procedure: HERNIA REPAIR VENTRAL ADULT;  Surgeon: Robert Bellow, MD;  Location: [facility] ORS;  Service: General;  Laterality: N/A;    Social History   Socioeconomic History   Marital status: Married    Spouse name: Anne [name]   Number of children: Not on file   Years of education: Not on file   Highest education level: Not on file  Occupational History   Not on file  Tobacco Use   Smoking status: Former    Packs/day: 2.00    Years: 50.00    Total pack years: 100.00    Types: Cigarettes, E-cigarettes    Quit date: 02/07/2012    Years since quitting: 9.9   Smokeless tobacco: Never  Vaping Use   Vaping Use: Former   Start date: 10/06/2013   Devices: uses no liquid  Substance and Sexual Activity   Alcohol use: No    Alcohol/week: 0.0 standard drinks of alcohol   Drug use: No   Sexual activity: Yes  Other Topics Concern   Not on file  Social History Narrative   Married   Retired   Clinical cytogeneticist level of education    No children    1 cup of coffee   Social Determinants of Health   Financial Resource Strain: Low Risk  (09/08/2020)   Overall Financial Resource Strain (CARDIA)    Difficulty of Paying Living  Expenses: Not hard at all  Food Insecurity: No Food Insecurity (09/08/2020)   Hunger Vital Sign    Worried About Running Out of Food in the Last Year: Never true    [place] in the Last Year: Never true  Transportation Needs: No Transportation Needs (09/08/2020)   PRAPARE - Hydrologist (Medical): No    Lack of Transportation (Non-Medical): No  Physical Activity: Unknown (10/29/2018)   Exercise Vital Sign    Days of Exercise per Week: Patient refused    Minutes of Exercise per Session: Patient refused  Stress: No Stress Concern Present (09/08/2020)   [place]    Feeling of Stress : Not at all  Social Connections: Unknown (09/08/2020)   Social Connection and Isolation Panel [NHANES]    Frequency of Communication with Friends and Family: More than three times a week    Frequency of Social Gatherings with Friends and Family: Not on file    Attends Religious Services: Not on file    Active Member of Clubs or Organizations: Not on file  Attends Archivist Meetings: Not on file    Marital Status: Married  Intimate Partner Violence: Not At Risk (09/08/2020)   Humiliation, Afraid, Rape, and Kick questionnaire    Fear of Current or Ex-Partner: No    Emotionally Abused: No    Physically Abused: No    Sexually Abused: No    Family History  Problem Relation Age of Onset   Hypertension Mother    Ovarian cancer Mother 66   Heart disease Father    Stroke Father    Ovarian cancer Sister        ? dx cancer had hyst.   Breast cancer Neg Hx      Current Outpatient Medications:    amLODipine (NORVASC) 5 MG tablet, Take 1 tablet (5 mg total) by mouth daily., Disp: 90 tablet, Rfl: 3   buPROPion (WELLBUTRIN XL) 300 MG 24 hr tablet, Take 1 tablet by mouth once daily, Disp: 90 tablet, Rfl: 3   clobetasol cream (TEMOVATE) 2.44 %, Apply 1 application topically 2 (two) times daily. Lower back,  Disp: 60 g, Rfl: 1   folic acid (FOLVITE) 1 MG tablet, Take 1 tablet by mouth once daily, Disp: 90 tablet, Rfl: 0   ibuprofen (ADVIL,MOTRIN) 200 MG tablet, Take 400 mg by mouth every 8 (eight) hours as needed for headache or moderate pain., Disp: , Rfl:    levothyroxine (SYNTHROID) 125 MCG tablet, Take 1 tablet (125 mcg total) by mouth daily before breakfast. Take the pill first thing in the morning. Do not take other pills at the same time., Disp: 30 tablet, Rfl: 3   pantoprazole (PROTONIX) 40 MG tablet, TAKE 1 TABLET BY MOUTH TWICE DAILY BEFORE A MEAL, Disp: 180 tablet, Rfl: 0   potassium chloride SA (KLOR-CON M) 20 MEQ tablet, Take 1 tablet by mouth once daily, Disp: 14 tablet, Rfl: 0   QUEtiapine (SEROQUEL) 25 MG tablet, Take 0.5 tablets (12.5 mg total) by mouth at bedtime., Disp: 15 tablet, Rfl: 6   rivastigmine (EXELON) 1.5 MG capsule, Take 1 capsule (1.5 mg total) by mouth 2 (two) times daily., Disp: 60 capsule, Rfl: 6   rosuvastatin (CRESTOR) 20 MG tablet, Take 1 tablet by mouth once daily, Disp: 90 tablet, Rfl: 0   sertraline (ZOLOFT) 100 MG tablet, Take 2 tablets (200 mg total) by mouth daily., Disp: 180 tablet, Rfl: 1 No current facility-administered medications for this visit.  Facility-Administered Medications Ordered in Other Visits:    denosumab (XGEVA) injection 120 mg, 120 mg, Subcutaneous, Q30 days, Sindy Guadeloupe, MD, 120 mg at 03/05/19 1525   heparin lock flush 100 UNIT/ML injection, , , ,    sodium chloride flush (NS) 0.9 % injection 10 mL, 10 mL, Intravenous, PRN, Sindy Guadeloupe, MD, 10 mL at 01/10/22 1307   sodium chloride flush (NS) 0.9 % injection 10 mL, 10 mL, Intracatheter, PRN, Sindy Guadeloupe, MD, 10 mL at 01/10/22 1505  Physical exam:  Vitals:   01/10/22 1332  BP: 122/84  Pulse: 82  Resp: 16  Temp: (!) 97.5 F (36.4 C)  SpO2: 97%  Weight: 166 lb 9.6 oz (75.6 kg)   Physical Exam Cardiovascular:     Rate and Rhythm: Normal rate and regular rhythm.     Heart  sounds: Normal heart sounds.  Pulmonary:     Effort: Pulmonary effort is normal.     Breath sounds: Normal breath sounds.  Abdominal:     General: Bowel sounds are normal.     Palpations:  Abdomen is soft.  Skin:    General: Skin is warm and dry.  Neurological:     Mental Status: She is alert.     Comments: Oriented to self and place         Latest Ref Rng & Units 01/10/2022    1:07 PM  CMP  Glucose 70 - 99 mg/dL 104   BUN 8 - 23 mg/dL 15   Creatinine 0.44 - 1.00 mg/dL 1.05   Sodium 135 - 145 mmol/L 141   Potassium 3.5 - 5.1 mmol/L 3.4   Chloride 98 - 111 mmol/L 103   CO2 22 - 32 mmol/L 29   Calcium 8.9 - 10.3 mg/dL 8.9   Total Protein 6.5 - 8.1 g/dL 7.2   Total Bilirubin 0.3 - 1.2 mg/dL 0.6   Alkaline Phos 38 - 126 U/L 72   AST 15 - 41 U/L 29   ALT 0 - 44 U/L 22       Latest Ref Rng & Units 01/10/2022    1:07 PM  CBC  WBC 4.0 - 10.5 K/uL 5.4   Hemoglobin 12.0 - 15.0 g/dL 12.5   Hematocrit 36.0 - 46.0 % 40.0   Platelets 150 - 400 K/uL 148     No images are attached to the encounter.  CT CHEST ABDOMEN PELVIS W CONTRAST  Result Date: 01/02/2022 CLINICAL DATA:  71 year old female with history of small cell lung cancer with metastatic disease to the bones. Follow-up study. * Tracking Code: BO * EXAM: CT CHEST, ABDOMEN, AND PELVIS WITH CONTRAST TECHNIQUE: Multidetector CT imaging of the chest, abdomen and pelvis was performed following the standard protocol during bolus administration of intravenous contrast. RADIATION DOSE REDUCTION: This exam was performed according to the departmental dose-optimization program which includes automated exposure control, adjustment of the mA and/or kV according to patient size and/or use of iterative reconstruction technique. CONTRAST:  153mL OMNIPAQUE IOHEXOL 300 MG/ML  SOLN COMPARISON:  CT of the chest, abdomen and pelvis 08/10/2021. FINDINGS: CT CHEST FINDINGS Cardiovascular: Heart size is normal. There is no significant pericardial fluid,  thickening or pericardial calcification. There is aortic atherosclerosis, as well as atherosclerosis of the great vessels of the mediastinum and the coronary arteries, including calcified atherosclerotic plaque in the left main, left anterior descending and right coronary arteries. Severe thickening and calcification of the aortic valve. Mediastinum/Nodes: No pathologically enlarged mediastinal or hilar lymph nodes. Please note that accurate exclusion of hilar adenopathy is limited on noncontrast CT scans. Esophagus is unremarkable in appearance. No axillary lymphadenopathy. Lungs/Pleura: Tiny pulmonary nodules in both lungs, stable in number and size compared to the prior study, with the largest of these lesions in the posterior aspect of the right upper lobe (axial image 28 of series 3) measuring 4 x 2 mm (mean diameter of 3 mm). No other larger more suspicious appearing pulmonary nodules or masses are noted. No acute consolidative airspace disease. No pleural effusions. Mild diffuse bronchial wall thickening with mild centrilobular and paraseptal emphysema. Musculoskeletal: Healing nondisplaced fractures of the anterolateral aspects of the right fourth, fifth and sixth ribs, new compared to the prior examination. Chronic compression fracture of L2 with 30% loss of central vertebral body height. CT ABDOMEN PELVIS FINDINGS Hepatobiliary: Liver has a nodular contour, indicative of underlying cirrhosis. No discrete cystic or solid hepatic lesions. No intra or extrahepatic biliary ductal dilatation. Status post cholecystectomy. Pancreas: No pancreatic mass. No pancreatic ductal dilatation. No pancreatic or peripancreatic fluid collections or inflammatory changes. Spleen: Unremarkable. Adrenals/Urinary  Tract: Subcentimeter low-attenuation lesions in the left kidney, too small to definitively characterize, but statistically likely small cysts (no imaging follow-up recommended). Right kidney and bilateral adrenal glands  are normal in appearance. No hydroureteronephrosis. Urinary bladder is largely decompressed, but otherwise unremarkable in appearance. Stomach/Bowel: The appearance of the stomach is normal. No pathologic dilatation of small bowel or colon. The appendix is not confidently identified and may be surgically absent. Regardless, there are no inflammatory changes noted adjacent to the cecum to suggest the presence of an acute appendicitis at this time. Vascular/Lymphatic: Aortic atherosclerosis with fusiform ectasia of the infrarenal abdominal aorta which measures up to 2.4 cm in diameter. No lymphadenopathy noted in the abdomen or pelvis. Reproductive: Uterus and ovaries are atrophic. Other: No significant volume of ascites.  No pneumoperitoneum. Musculoskeletal: Chronic compression fractures of L1, L2, L3 and L4, most severe at L2 where there is 90% loss of central vertebral body height. Mixed lucency and sclerosis with compression of the superior endplate of S1, likely pathologic. IMPRESSION: 1. Widespread skeletal metastatic disease appears grossly similar to the prior examination. No extraskeletal metastatic disease confidently identified in the chest, abdomen or pelvis. 2. Tiny pulmonary nodules in both lungs, stable compared to the prior examination, measuring 3 mm or less in size, nonspecific but statistically likely benign. Attention on follow-up studies is recommended to ensure stability. 3. New but nonacute healing nondisplaced fractures of the anterolateral aspects of the right fourth, fifth and sixth ribs. 4. Aortic atherosclerosis, in addition to left main and 2 vessel coronary artery disease. Assessment for potential risk factor modification, dietary therapy or pharmacologic therapy may be warranted, if clinically indicated. 5. There are calcifications of the aortic valve. Echocardiographic correlation for evaluation of potential valvular dysfunction may be warranted if clinically indicated. 6. Mild diffuse  bronchial wall thickening with mild centrilobular and paraseptal emphysema; imaging findings suggestive of underlying COPD. 7. Morphologic changes in the liver suggestive of underlying cirrhosis. 8. Additional incidental findings, as above. Electronically Signed   By: Vinnie Langton M.D.   On: 01/02/2022 08:01     Assessment and plan- Patient is a 71 y.o. 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  She is here for on treatment assessment prior to cycle 52 of palliative Tecentriq  Counts okay to proceed with cycle 52 of palliative Tecentriq today.  I will see her back in 3 weeks for cycle 53.  I have reviewed CT chest abdomen and pelvis images independently and discussed findings with thePatient which continues to show stable disease and stable areas of bone metastases without any evidence of progression.  She does not have any evidence of visceral metastases.  Patient has been on palliative Tecentriq for about 3 years now.  Autoimmune hypothyroidism: Patient's compliance with levothyroxine is questionable.  TSH is overall trending down but remains elevated.  Continue to monitor   Visit Diagnosis 1. Small cell lung cancer, right upper lobe ([place])   2. Encounter for antineoplastic immunotherapy      Dr. Randa Evens, MD, MPH [facility] [phone] 01/10/2022 3:28 PM

## 2022-01-17 ENCOUNTER — Telehealth: Payer: Self-pay | Admitting: Family Medicine

## 2022-01-17 NOTE — Telephone Encounter (Signed)
Copied from [place] [id number]. Topic: Medicare AWV >> Jan 17, 2022  9:47 AM Devoria Glassing wrote: Reason for CRM: Called patient to schedule Annual Wellness Visit.  Please schedule with Nurse Health Advisor Madelyn Brunner, LPN at [facility]. This appt can be telephone or office visit.  Please call [phone] ask for [facility]

## 2022-01-22 ENCOUNTER — Encounter: Payer: Self-pay | Admitting: Neurology

## 2022-01-22 ENCOUNTER — Ambulatory Visit: Payer: PPO | Admitting: Neurology

## 2022-01-22 DIAGNOSIS — H353131 Nonexudative age-related macular degeneration, bilateral, early dry stage: Secondary | ICD-10-CM | POA: Diagnosis not present

## 2022-01-22 DIAGNOSIS — H353211 Exudative age-related macular degeneration, right eye, with active choroidal neovascularization: Secondary | ICD-10-CM | POA: Diagnosis not present

## 2022-01-22 DIAGNOSIS — Z961 Presence of intraocular lens: Secondary | ICD-10-CM | POA: Diagnosis not present

## 2022-01-30 ENCOUNTER — Other Ambulatory Visit: Payer: Self-pay | Admitting: Family Medicine

## 2022-01-31 ENCOUNTER — Inpatient Hospital Stay: Payer: PPO

## 2022-01-31 ENCOUNTER — Encounter: Payer: Self-pay | Admitting: Oncology

## 2022-01-31 ENCOUNTER — Inpatient Hospital Stay (HOSPITAL_BASED_OUTPATIENT_CLINIC_OR_DEPARTMENT_OTHER): Payer: PPO | Admitting: Oncology

## 2022-01-31 ENCOUNTER — Ambulatory Visit: Payer: PPO | Admitting: Family Medicine

## 2022-01-31 VITALS — BP 144/90 | HR 76 | Temp 97.4°F | Resp 16 | Wt 166.0 lb

## 2022-01-31 DIAGNOSIS — Z5112 Encounter for antineoplastic immunotherapy: Secondary | ICD-10-CM

## 2022-01-31 DIAGNOSIS — C3411 Malignant neoplasm of upper lobe, right bronchus or lung: Secondary | ICD-10-CM | POA: Diagnosis not present

## 2022-01-31 DIAGNOSIS — E063 Autoimmune thyroiditis: Secondary | ICD-10-CM

## 2022-01-31 LAB — CBC WITH DIFFERENTIAL/PLATELET
Abs Immature Granulocytes: 0.01 10*3/uL (ref 0.00–0.07)
Basophils Absolute: 0 10*3/uL (ref 0.0–0.1)
Basophils Relative: 1 %
Eosinophils Absolute: 0.1 10*3/uL (ref 0.0–0.5)
Eosinophils Relative: 3 %
HCT: 38.9 % (ref 36.0–46.0)
Hemoglobin: 12.5 g/dL (ref 12.0–15.0)
Immature Granulocytes: 0 %
Lymphocytes Relative: 21 %
Lymphs Abs: 1 10*3/uL (ref 0.7–4.0)
MCH: 28.5 pg (ref 26.0–34.0)
MCHC: 32.1 g/dL (ref 30.0–36.0)
MCV: 88.6 fL (ref 80.0–100.0)
Monocytes Absolute: 0.4 10*3/uL (ref 0.1–1.0)
Monocytes Relative: 9 %
Neutro Abs: 3 10*3/uL (ref 1.7–7.7)
Neutrophils Relative %: 66 %
Platelets: 159 10*3/uL (ref 150–400)
RBC: 4.39 MIL/uL (ref 3.87–5.11)
RDW: 15 % (ref 11.5–15.5)
WBC: 4.6 10*3/uL (ref 4.0–10.5)
nRBC: 0 % (ref 0.0–0.2)

## 2022-01-31 LAB — COMPREHENSIVE METABOLIC PANEL
ALT: 24 U/L (ref 0–44)
AST: 32 U/L (ref 15–41)
Albumin: 3.9 g/dL (ref 3.5–5.0)
Alkaline Phosphatase: 76 U/L (ref 38–126)
Anion gap: 9 (ref 5–15)
BUN: 19 mg/dL (ref 8–23)
CO2: 29 mmol/L (ref 22–32)
Calcium: 9.1 mg/dL (ref 8.9–10.3)
Chloride: 102 mmol/L (ref 98–111)
Creatinine, Ser: 0.94 mg/dL (ref 0.44–1.00)
GFR, Estimated: >60 mL/min (ref >60)
Glucose, Bld: 92 mg/dL (ref 70–99)
Potassium: 3.7 mmol/L (ref 3.5–5.1)
Sodium: 140 mmol/L (ref 135–145)
Total Bilirubin: 0.5 mg/dL (ref 0.3–1.2)
Total Protein: 7.6 g/dL (ref 6.5–8.1)

## 2022-01-31 MED ORDER — SODIUM CHLORIDE 0.9 % IV SOLN
Freq: Once | INTRAVENOUS | Status: AC
  Filled 2022-01-31: qty 250

## 2022-01-31 MED ORDER — HEPARIN SOD (PORK) LOCK FLUSH 100 UNIT/ML IV SOLN
500.0000 [IU] | Freq: Once | INTRAVENOUS | Status: AC | PRN
  Administered 2022-01-31: 500 [IU]
  Filled 2022-01-31: qty 5

## 2022-01-31 MED ORDER — SODIUM CHLORIDE 0.9 % IV SOLN
1200.0000 mg | Freq: Once | INTRAVENOUS | Status: AC
  Administered 2022-01-31: 1200 mg via INTRAVENOUS
  Filled 2022-01-31: qty 20

## 2022-01-31 NOTE — Progress Notes (Signed)
Pt in for follow up denies any concerns today.  

## 2022-01-31 NOTE — Patient Instructions (Signed)
[facility] CANCER CTR AT [place]  Discharge Instructions: Thank you for choosing [facility] to provide your oncology and hematology care.  If you have a lab appointment with the [place], please go directly to the [facility] [place] and check in at the registration area.  Wear comfortable clothing and clothing appropriate for easy access to any Portacath or PICC line.   We strive to give you quality time with your provider. You may need to reschedule your appointment if you arrive late (15 or more minutes).  Arriving late affects you and other patients whose appointments are after yours.  Also, if you miss three or more appointments without notifying the office, you may be dismissed from the clinic at the provider's discretion.      For prescription refill requests, have your pharmacy contact our office and allow 72 hours for refills to be completed.    Today you received the following chemotherapy and/or immunotherapy agents Tecentriq      To help prevent nausea and vomiting after your treatment, we encourage you to take your nausea medication as directed.  BELOW ARE SYMPTOMS THAT SHOULD BE REPORTED IMMEDIATELY: *FEVER GREATER THAN 100.4 F (38 C) OR HIGHER *CHILLS OR SWEATING *NAUSEA AND VOMITING THAT IS NOT CONTROLLED WITH YOUR NAUSEA MEDICATION *UNUSUAL SHORTNESS OF BREATH *UNUSUAL BRUISING OR BLEEDING *URINARY PROBLEMS (pain or burning when urinating, or frequent urination) *BOWEL PROBLEMS (unusual diarrhea, constipation, pain near the anus) TENDERNESS IN MOUTH AND THROAT WITH OR WITHOUT PRESENCE OF ULCERS (sore throat, sores in mouth, or a toothache) UNUSUAL RASH, SWELLING OR PAIN  UNUSUAL VAGINAL DISCHARGE OR ITCHING   Items with * indicate a potential emergency and should be followed up as soon as possible or go to the Emergency Department if any problems should occur.  Please show the CHEMOTHERAPY ALERT CARD or IMMUNOTHERAPY ALERT CARD at check-in to  the Emergency Department and triage nurse.  Should you have questions after your visit or need to cancel or reschedule your appointment, please contact [facility] CANCER [place] AT [place]  [phone] and follow the prompts.  Office hours are 8:00 a.m. to 4:30 p.m. Monday - Friday. Please note that voicemails left after 4:00 p.m. may not be returned until the following business day.  We are closed weekends and major holidays. You have access to a nurse at all times for urgent questions. Please call the main number to the clinic [phone] and follow the prompts.  For any non-urgent questions, you may also contact your provider using MyChart. We now offer e-Visits for anyone 80 and older to request care online for non-urgent symptoms. For details visit [email or web address].   Also download the MyChart app! Go to the app store, search "MyChart", open the app, select [facility], and log in with your MyChart username and password.

## 2022-02-01 ENCOUNTER — Encounter: Payer: Self-pay | Admitting: Oncology

## 2022-02-01 NOTE — Progress Notes (Signed)
I connected with Anne [name] on 02/01/22 at  2:15 PM EST by video enabled telemedicine visit and verified that I am speaking with the correct person using two identifiers.   I discussed the limitations, risks, security and privacy concerns of performing an evaluation and management service by telemedicine and the availability of in-person appointments. I also discussed with the patient that there may be a patient responsible charge related to this service. The patient expressed understanding and agreed to proceed.  Other persons participating in the visit and their role in the encounter:  none  Patient's location:  cancer center Provider's location:  home  Chief Complaint: On treatment assessment prior to next cycle of Tecentriq  History of present illness: patient is a 71 year old female with a past medical history significant for hypertension hyperlipidemia obesity and cirrhosis of the liver among other medical problems.  She has been referred to [place] for findings of bone metastases and her recent MRI.  She has a prior history of 3 packs/day day smoking for over 45 years and quit smoking 5 years ago.She had a CT chest abdomen pelvis in 2018 which showed a 5 mm lung nodule in the left lower lobe.  Recently over the last 2 months patient has been having worsening back pain and was referred to orthopedics.  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  Pathologic fracture of S1.   PET scan showed 2 RUL lung nodules, hilar and mediastinal adenopathy and widespread bone mets. MRI brain negative.   Patient completed palliative RT to her spine. Bronchoscopy showed small cell lung cancer.  Palliative carboplatin, etoposide and Tecentriq started on 08/18/2018. Scans after 4 cycles showed stable disease. She is on maintenance tecentriq Patient has autoimmune hypothyroidism for  which she is on levothyroxine.  She reports being compliant but her values fluctuate widely.    Interval history denies any recent falls.  She is doing well overall and tolerating treatments well without any significant side effects.   Review of Systems  Constitutional:  Negative for chills, fever, malaise/fatigue and weight loss.  HENT:  Negative for congestion, ear discharge and nosebleeds.   Eyes:  Negative for blurred vision.  Respiratory:  Negative for cough, hemoptysis, sputum production, shortness of breath and wheezing.   Cardiovascular:  Negative for chest pain, palpitations, orthopnea and claudication.  Gastrointestinal:  Negative for abdominal pain, blood in stool, constipation, diarrhea, heartburn, melena, nausea and vomiting.  Genitourinary:  Negative for dysuria, flank pain, frequency, hematuria and urgency.  Musculoskeletal:  Negative for back pain, joint pain and myalgias.  Skin:  Negative for rash.  Neurological:  Negative for dizziness, tingling, focal weakness, seizures, weakness and headaches.  Endo/Heme/Allergies:  Does not bruise/bleed easily.  Psychiatric/Behavioral:  Negative for depression and suicidal ideas. The patient does not have insomnia.     Allergies  Allergen Reactions   Bee Venom Swelling    Past Medical History:  Diagnosis Date   Arthritis    Cirrhosis ([place])    Dementia ([place])    Depression    GERD (gastroesophageal reflux disease)    Hyperlipidemia    Hypertension    Lung cancer ([place])    Metastatic bone cancer     Past Surgical History:  Procedure Laterality Date   APPENDECTOMY  1971   CHOLECYSTECTOMY  1971   COLONOSCOPY WITH PROPOFOL N/A 11/15/2016   Procedure: COLONOSCOPY  WITH PROPOFOL;  Surgeon: Jonathon Bellows, MD;  Location: [facility] ENDOSCOPY;  Service: Gastroenterology;  Laterality: N/A;   ENDOBRONCHIAL ULTRASOUND Right 07/30/2018   Procedure: ENDOBRONCHIAL ULTRASOUND;  Surgeon: Laverle Hobby, MD;  Location: [facility] ORS;  Service:  Pulmonary;  Laterality: Right;   ESOPHAGOGASTRODUODENOSCOPY (EGD) WITH PROPOFOL N/A 01/07/2017   Procedure: ESOPHAGOGASTRODUODENOSCOPY (EGD) WITH PROPOFOL;  Surgeon: Jonathon Bellows, MD;  Location: [facility] ENDOSCOPY;  Service: Gastroenterology;  Laterality: N/A;   LAPAROSCOPY N/A 03/01/2017   Procedure: LAPAROSCOPY DIAGNOSTIC;  Surgeon: Robert Bellow, MD;  Location: [facility] ORS;  Service: General;  Laterality: N/A;   PORTA CATH INSERTION N/A 08/14/2018   Procedure: PORTA CATH INSERTION;  Surgeon: Algernon Huxley, MD;  Location: [facility];  Service: Cardiovascular;  Laterality: N/A;   TONSILECTOMY, ADENOIDECTOMY, BILATERAL MYRINGOTOMY AND TUBES  1955   TONSILLECTOMY     VENTRAL HERNIA REPAIR N/A 03/01/2017   10 x 14 CM Ventralight ST mesh, intraperitoneal location.    VENTRAL HERNIA REPAIR N/A 03/01/2017   Procedure: HERNIA REPAIR VENTRAL ADULT;  Surgeon: Robert Bellow, MD;  Location: [facility] ORS;  Service: General;  Laterality: N/A;    Social History   Socioeconomic History   Marital status: Married    Spouse name: Johnny   Number of children: Not on file   Years of education: Not on file   Highest education level: Not on file  Occupational History   Not on file  Tobacco Use   Smoking status: Former    Packs/day: 2.00    Years: 50.00    Total pack years: 100.00    Types: Cigarettes, E-cigarettes    Quit date: 02/07/2012    Years since quitting: 9.9   Smokeless tobacco: Never  Vaping Use   Vaping Use: Former   Start date: 10/06/2013   Devices: uses no liquid  Substance and Sexual Activity   Alcohol use: No    Alcohol/week: 0.0 standard drinks of alcohol   Drug use: No   Sexual activity: Yes  Other Topics Concern   Not on file  Social History Narrative   Married   Retired   Clinical cytogeneticist level of education    No children    1 cup of coffee   Social Determinants of Health   Financial Resource Strain: Low Risk  (09/08/2020)   Overall Financial Resource Strain  (CARDIA)    Difficulty of Paying Living Expenses: Not hard at all  Food Insecurity: No Food Insecurity (09/08/2020)   Hunger Vital Sign    Worried About Running Out of Food in the Last Year: Never true    [place] in the Last Year: Never true  Transportation Needs: No Transportation Needs (09/08/2020)   PRAPARE - Hydrologist (Medical): No    Lack of Transportation (Non-Medical): No  Physical Activity: Unknown (10/29/2018)   Exercise Vital Sign    Days of Exercise per Week: Patient refused    Minutes of Exercise per Session: Patient refused  Stress: No Stress Concern Present (09/08/2020)   [place]    Feeling of Stress : Not at all  Social Connections: Unknown (09/08/2020)   Social Connection and Isolation Panel [NHANES]    Frequency of Communication with Friends and Family: More than three times a week    Frequency of Social Gatherings with Friends and Family: Not on file    Attends Religious Services: Not on file    Active Member  of Clubs or Organizations: Not on file    Attends Club or Organization Meetings: Not on file    Marital Status: Married  Intimate Partner Violence: Not At Risk (09/08/2020)   Humiliation, Afraid, Rape, and Kick questionnaire    Fear of Current or Ex-Partner: No    Emotionally Abused: No    Physically Abused: No    Sexually Abused: No    Family History  Problem Relation Age of Onset   Hypertension Mother    Ovarian cancer Mother 37   Heart disease Father    Stroke Father    Ovarian cancer Sister        ? dx cancer had hyst.   Breast cancer Neg Hx      Current Outpatient Medications:    amLODipine (NORVASC) 5 MG tablet, Take 1 tablet (5 mg total) by mouth daily., Disp: 90 tablet, Rfl: 3   buPROPion (WELLBUTRIN XL) 300 MG 24 hr tablet, Take 1 tablet by mouth once daily, Disp: 90 tablet, Rfl: 3   clobetasol cream (TEMOVATE) 2.35 %, Apply 1 application  topically 2 (two) times daily. Lower back, Disp: 60 g, Rfl: 1   folic acid (FOLVITE) 1 MG tablet, Take 1 tablet by mouth once daily, Disp: 90 tablet, Rfl: 0   ibuprofen (ADVIL,MOTRIN) 200 MG tablet, Take 400 mg by mouth every 8 (eight) hours as needed for headache or moderate pain., Disp: , Rfl:    levothyroxine (SYNTHROID) 125 MCG tablet, Take 1 tablet (125 mcg total) by mouth daily before breakfast. Take the pill first thing in the morning. Do not take other pills at the same time., Disp: 30 tablet, Rfl: 3   pantoprazole (PROTONIX) 40 MG tablet, TAKE 1 TABLET BY MOUTH TWICE DAILY BEFORE A MEAL, Disp: 180 tablet, Rfl: 0   potassium chloride SA (KLOR-CON M) 20 MEQ tablet, Take 1 tablet by mouth once daily, Disp: 14 tablet, Rfl: 0   QUEtiapine (SEROQUEL) 25 MG tablet, Take 0.5 tablets (12.5 mg total) by mouth at bedtime., Disp: 15 tablet, Rfl: 6   rivastigmine (EXELON) 1.5 MG capsule, Take 1 capsule (1.5 mg total) by mouth 2 (two) times daily., Disp: 60 capsule, Rfl: 6   rosuvastatin (CRESTOR) 20 MG tablet, Take 1 tablet by mouth once daily, Disp: 90 tablet, Rfl: 0   sertraline (ZOLOFT) 100 MG tablet, Take 2 tablets (200 mg total) by mouth daily., Disp: 180 tablet, Rfl: 1 No current facility-administered medications for this visit.  Facility-Administered Medications Ordered in Other Visits:    denosumab (XGEVA) injection 120 mg, 120 mg, Subcutaneous, Q30 days, Sindy Guadeloupe, MD, 120 mg at 03/05/19 1525   heparin lock flush 100 UNIT/ML injection, , , ,   No results found.  No images are attached to the encounter.      Latest Ref Rng & Units 01/31/2022    1:54 PM  CMP  Glucose 70 - 99 mg/dL 92   BUN 8 - 23 mg/dL 19   Creatinine 0.44 - 1.00 mg/dL 0.94   Sodium 135 - 145 mmol/L 140   Potassium 3.5 - 5.1 mmol/L 3.7   Chloride 98 - 111 mmol/L 102   CO2 22 - 32 mmol/L 29   Calcium 8.9 - 10.3 mg/dL 9.1   Total Protein 6.5 - 8.1 g/dL 7.6   Total Bilirubin 0.3 - 1.2 mg/dL 0.5   Alkaline Phos  38 - 126 U/L 76   AST 15 - 41 U/L 32   ALT 0 - 44 U/L 24  Latest Ref Rng & Units 01/31/2022    1:54 PM  CBC  WBC 4.0 - 10.5 K/uL 4.6   Hemoglobin 12.0 - 15.0 g/dL 12.5   Hematocrit 36.0 - 46.0 % 38.9   Platelets 150 - 400 K/uL 159      Observation/objective: Appears in no acute distress over video visit today.  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  I will plan to get repeat scans in April 2023 and if overall if she has stable disease I will plan to stop Tecentriq at that time.  Autoimmune hypothyroidism: TSH is trending down presently and I am not increasing her dose of levothyroxine just yet.  Will repeat TSH again in 3 weeks  Follow-up instructions:  I discussed the assessment and treatment plan with the patient. The patient was provided an opportunity to ask questions and all were answered. The patient agreed with the plan and demonstrated an understanding of the instructions.   The patient was advised to call back or seek an in-person evaluation if the symptoms worsen or if the condition fails to improve as anticipated.  I provided 14 minutes of face-to-face video visit time during this encounter, and > 50% was spent counseling as documented under my assessment & plan.  Visit Diagnosis: 1. Autoimmune hypothyroidism   2. Small cell lung cancer, right upper lobe ([place])   3. Encounter for antineoplastic immunotherapy     Dr. Randa Evens, MD, MPH [facility] at [facility] Tel- [phone] 02/01/2022 8:12 AM

## 2022-02-02 ENCOUNTER — Other Ambulatory Visit: Payer: Self-pay | Admitting: Family Medicine

## 2022-02-02 DIAGNOSIS — Z515 Encounter for palliative care: Secondary | ICD-10-CM | POA: Diagnosis not present

## 2022-02-02 DIAGNOSIS — Z6828 Body mass index (BMI) 28.0-28.9, adult: Secondary | ICD-10-CM | POA: Diagnosis not present

## 2022-02-02 DIAGNOSIS — C3490 Malignant neoplasm of unspecified part of unspecified bronchus or lung: Secondary | ICD-10-CM | POA: Diagnosis not present

## 2022-02-02 DIAGNOSIS — C7951 Secondary malignant neoplasm of bone: Secondary | ICD-10-CM | POA: Diagnosis not present

## 2022-02-02 DIAGNOSIS — I10 Essential (primary) hypertension: Secondary | ICD-10-CM | POA: Diagnosis not present

## 2022-02-06 ENCOUNTER — Other Ambulatory Visit: Payer: Self-pay | Admitting: Oncology

## 2022-02-06 DIAGNOSIS — C3411 Malignant neoplasm of upper lobe, right bronchus or lung: Secondary | ICD-10-CM

## 2022-02-07 ENCOUNTER — Other Ambulatory Visit: Payer: Self-pay

## 2022-02-07 ENCOUNTER — Emergency Department (HOSPITAL_COMMUNITY): Payer: PPO

## 2022-02-07 ENCOUNTER — Encounter (HOSPITAL_COMMUNITY): Payer: Self-pay | Admitting: *Deleted

## 2022-02-07 ENCOUNTER — Emergency Department (HOSPITAL_COMMUNITY)
Admission: EM | Admit: 2022-02-07 | Discharge: 2022-02-07 | Disposition: A | Discharge status: Home / Self Care | Payer: PPO | Attending: Emergency Medicine | Admitting: Emergency Medicine

## 2022-02-07 DIAGNOSIS — I10 Essential (primary) hypertension: Secondary | ICD-10-CM | POA: Diagnosis not present

## 2022-02-07 DIAGNOSIS — R1111 Vomiting without nausea: Secondary | ICD-10-CM | POA: Diagnosis not present

## 2022-02-07 DIAGNOSIS — R519 Headache, unspecified: Secondary | ICD-10-CM | POA: Diagnosis not present

## 2022-02-07 DIAGNOSIS — G4489 Other headache syndrome: Secondary | ICD-10-CM | POA: Diagnosis not present

## 2022-02-07 DIAGNOSIS — Z8505 Personal history of malignant neoplasm of liver: Secondary | ICD-10-CM | POA: Diagnosis not present

## 2022-02-07 DIAGNOSIS — R569 Unspecified convulsions: Secondary | ICD-10-CM | POA: Diagnosis not present

## 2022-02-07 LAB — BASIC METABOLIC PANEL
Anion gap: 12 (ref 5–15)
BUN: 16 mg/dL (ref 8–23)
CO2: 28 mmol/L (ref 22–32)
Calcium: 9.3 mg/dL (ref 8.9–10.3)
Chloride: 98 mmol/L (ref 98–111)
Creatinine, Ser: 0.92 mg/dL (ref 0.44–1.00)
GFR, Estimated: >60 mL/min (ref >60)
Glucose, Bld: 115 mg/dL — ABNORMAL HIGH (ref 70–99)
Potassium: 3.6 mmol/L (ref 3.5–5.1)
Sodium: 138 mmol/L (ref 135–145)

## 2022-02-07 LAB — CBC WITH DIFFERENTIAL/PLATELET
Abs Immature Granulocytes: 0.02 10*3/uL (ref 0.00–0.07)
Basophils Absolute: 0 10*3/uL (ref 0.0–0.1)
Basophils Relative: 1 %
Eosinophils Absolute: 0.1 10*3/uL (ref 0.0–0.5)
Eosinophils Relative: 1 %
HCT: 44.5 % (ref 36.0–46.0)
Hemoglobin: 13.9 g/dL (ref 12.0–15.0)
Immature Granulocytes: 0 %
Lymphocytes Relative: 8 %
Lymphs Abs: 0.4 10*3/uL — ABNORMAL LOW (ref 0.7–4.0)
MCH: 28 pg (ref 26.0–34.0)
MCHC: 31.2 g/dL (ref 30.0–36.0)
MCV: 89.7 fL (ref 80.0–100.0)
Monocytes Absolute: 0.2 10*3/uL (ref 0.1–1.0)
Monocytes Relative: 3 %
Neutro Abs: 4.7 10*3/uL (ref 1.7–7.7)
Neutrophils Relative %: 87 %
Platelets: 147 10*3/uL — ABNORMAL LOW (ref 150–400)
RBC: 4.96 MIL/uL (ref 3.87–5.11)
RDW: 14.9 % (ref 11.5–15.5)
WBC: 5.4 10*3/uL (ref 4.0–10.5)
nRBC: 0 % (ref 0.0–0.2)

## 2022-02-07 LAB — LIPASE, BLOOD: Lipase: 43 U/L (ref 11–51)

## 2022-02-07 MED ORDER — ONDANSETRON HCL 4 MG PO TABS: 4.0000 mg | ORAL_TABLET | Freq: Four times a day (QID) | ORAL | 0 refills | Status: DC

## 2022-02-07 MED ORDER — ONDANSETRON HCL 4 MG/2ML IJ SOLN
4.0000 mg | Freq: Once | INTRAMUSCULAR | Status: AC
  Administered 2022-02-07: 4 mg via INTRAVENOUS
  Filled 2022-02-07: qty 2

## 2022-02-07 NOTE — ED Provider Notes (Signed)
[facility] EMERGENCY DEPARTMENT Provider Note   CSN: [id number] Arrival date & time: 02/07/22  1734     History Chief Complaint  Patient presents with   Seizures    HPI Anne [name] is a 72 y.o. female presenting for seizure considered.  Extensive history hypertension hyperlipidemia obesity and cirrhosis of the liver among other medical problems including metastatic liver cancer. Attempted to call daughter for collateral but no answer. Fortunately, patient's husband arrived to provide collateral history.  She has underlying chronic headaches, dementia, metastatic cancer and has been following up with multiple specialist.  She has she has a follow-up with her neurologist in the a.m. He witnessed an episode where she was trying to eat today when she became blank staring forward and having diffuse shaking episodes.  It lasted approximately 1 minute with return to baseline over the following hour.  EMS arrived. It is been approximately 4 hours since the event and she is back at her baseline with no further symptoms.  She states that she vomited just prior to the seizure episode and she had been nauseous throughout the day with an intermittent headache for which she took a total of 7 medications.  Nausea vomiting and headache are all now resolved. Patient's recorded medical, surgical, social, medication list and allergies were reviewed in the Snapshot window as part of the initial history.   Review of Systems   Review of Systems  Constitutional:  Negative for chills and fever.  HENT:  Negative for ear pain and sore throat.   Eyes:  Negative for pain and visual disturbance.  Respiratory:  Negative for cough and shortness of breath.   Cardiovascular:  Negative for chest pain and palpitations.  Gastrointestinal:  Positive for nausea and vomiting. Negative for abdominal pain.  Genitourinary:  Negative for dysuria and hematuria.  Musculoskeletal:  Negative for arthralgias  and back pain.  Skin:  Negative for color change and rash.  Neurological:  Negative for seizures and syncope.  All other systems reviewed and are negative.   Physical Exam Updated Vital Signs BP (!) 172/91   Pulse 91   Temp 98.2 F (36.8 C)   Resp 18   Ht 5\' 7"  (1.702 m)   Wt 75.3 kg   SpO2 96%   BMI 26.00 kg/m  Physical Exam Vitals and nursing note reviewed.  Constitutional:      General: She is not in acute distress.    Appearance: She is well-developed.  HENT:     Head: Normocephalic and atraumatic.  Eyes:     Conjunctiva/sclera: Conjunctivae normal.  Cardiovascular:     Rate and Rhythm: Normal rate and regular rhythm.     Heart sounds: No murmur heard. Pulmonary:     Effort: Pulmonary effort is normal. No respiratory distress.     Breath sounds: Normal breath sounds.  Abdominal:     Palpations: Abdomen is soft.     Tenderness: There is no abdominal tenderness.  Musculoskeletal:        General: No swelling.     Cervical back: Neck supple.  Skin:    General: Skin is warm and dry.     Capillary Refill: Capillary refill takes less than 2 seconds.  Neurological:     Mental Status: She is alert and oriented to person, place, and time.     Cranial Nerves: No cranial nerve deficit.     Sensory: No sensory deficit.     Motor: No weakness.  Coordination: Coordination normal.  Psychiatric:        Mood and Affect: Mood normal.      ED Course/ Medical Decision Making/ A&P    Procedures Procedures   Medications Ordered in ED Medications  ondansetron (ZOFRAN) injection 4 mg (4 mg Intravenous Given 02/07/22 1815)    Medical Decision Making:    Anne [name] is a 72 y.o. female who presented to the ED today with headache and new seizure episode detailed above.     Patient's presentation is complicated by their history of metastatic cancer.  Patient placed on continuous vitals and telemetry monitoring while in ED which was reviewed periodically.   Complete  initial physical exam performed, notably the patient  was hemodynamically stable in no acute distress.  GCS 15 back to baseline at this time.      Reviewed and confirmed nursing documentation for past medical history, family history, social history.    Initial Assessment:   With the patient's presentation of seizure episode, most likely diagnosis is new metastatic intracranial lesion is suspected versus tension type headache versus new seizure disorder. Other diagnoses were considered including (but not limited to) syncope, cardiogenic etiology,. These are considered less likely due to history of present illness and physical exam findings.   This is most consistent with an acute life/limb threatening illness complicated by underlying chronic conditions.  Initial Plan:  CT head to evaluate for structural intracranial etiology of patient's symptoms Screening labs including CBC and Metabolic panel to evaluate for infectious or metabolic etiology of disease.  Urinalysis with reflex culture ordered to evaluate for UTI or relevant urologic/nephrologic pathology.  CXR to evaluate for structural/infectious intrathoracic pathology.  EKG to evaluate for cardiac pathology. Objective evaluation as below reviewed with plan for close reassessment  Initial Study Results:   Laboratory  All laboratory results reviewed without evidence of clinically relevant pathology.     EKG EKG was reviewed independently. Rate, rhythm, axis, intervals all examined and without medically relevant abnormality. ST segments without concerns for elevations.    Radiology  All images reviewed independently. Agree with radiology report at this time.   CT HEAD WO CONTRAST (5MM)  Result Date: 02/07/2022 CLINICAL DATA:  Head trauma after seizure.  Headache for 3 days. EXAM: CT HEAD WITHOUT CONTRAST TECHNIQUE: Contiguous axial images were obtained from the base of the skull through the vertex without intravenous contrast. RADIATION  DOSE REDUCTION: This exam was performed according to the departmental dose-optimization program which includes automated exposure control, adjustment of the mA and/or kV according to patient size and/or use of iterative reconstruction technique. COMPARISON:  10/19/2021 FINDINGS: Brain: No evidence of acute infarction, hemorrhage, hydrocephalus, extra-axial collection or mass lesion/mass effect. There is mild patchy low-attenuation within the subcortical and periventricular white matter compatible with chronic microvascular disease. Vascular: No hyperdense vessel or unexpected calcification. Skull: Normal. Negative for fracture or focal lesion. Sinuses/Orbits: There is partial opacification of the sphenoid sinus and left posterior ethmoid air cells. Other: None. IMPRESSION: 1. No acute intracranial abnormality. 2. Chronic microvascular disease. 3. Mild sphenoid sinus and left posterior ethmoid air cell inflammation. Electronically Signed   By: Kerby Moors M.D.   On: 02/07/2022 19:02      Final Assessment and Plan:   Patient reassessed after 2 and half hours in the emergency room.  Husband is now at bedside per the above history. Had a long conversation.  She has close follow-up with neurology already scheduled for later this month and sees her  primary care doctor in the morning.  CT does not have any acute pathology tonight and she has returned to her baseline.  Uncertain if patient's seizure episode was prompted by the vomiting, dehydration from poor p.o. intake or a tertiary etiology.  Mass remains on the differential and MRI would likely be warranted in the subacute setting. Unlikely that any further acute intervention in the emergency department would be beneficial. Regardless I had a shared medical conversation where we could consult neurology and get an MRI here in the emergency department, or since she is returned to baseline she could follow-up with her PCP in the morning for symptomatic  reassessment and MRI in the outpatient setting if needed. Patient and husband both requested discharge given her return to baseline and resolution of symptoms.  She states she would like to go home and have dinner and follow-up in the outpatient setting.  She expressed understanding of risk of repeat seizure episode, importance of avoiding water to avoid drowning and importance of obtaining close follow-up or returning if she has any further symptomatic progression.   Disposition:  I have considered need for hospitalization, however, considering all of the above, I believe this patient is stable for discharge at this time.  Patient/family educated about specific return precautions for given chief complaint and symptoms.  Patient/family educated about follow-up with PCP/neurology.     Patient/family expressed understanding of return precautions and need for follow-up. Patient spoken to regarding all imaging and laboratory results and appropriate follow up for these results. All education provided in verbal form with additional information in written form. Time was allowed for answering of patient questions. Patient discharged.    Emergency Department Medication Summary:   Medications  ondansetron [facility]) injection 4 mg (4 mg Intravenous Given 02/07/22 1815)         Clinical Impression:  1. Seizure-like activity [facility])      Discharge   Final Clinical Impression(s) / ED Diagnoses Final diagnoses:  Seizure-like activity ([place])    Rx / DC Orders ED Discharge Orders          Ordered    ondansetron (ZOFRAN) 4 MG tablet  Every 6 hours        02/07/22 1954              Tretha Sciara, MD 02/07/22 1955

## 2022-02-07 NOTE — ED Triage Notes (Signed)
The pt arrived by gems from home  she has had a headache for 3 days and according to family they witnessed a seizure  no description available until the family arrives  no tongue damage no incontinence   alert and oriented she reports that she has headaches   every day

## 2022-02-08 ENCOUNTER — Encounter: Payer: Self-pay | Admitting: Family Medicine

## 2022-02-08 ENCOUNTER — Ambulatory Visit (INDEPENDENT_AMBULATORY_CARE_PROVIDER_SITE_OTHER): Payer: PPO | Admitting: Family Medicine

## 2022-02-08 VITALS — BP 110/70 | HR 91 | Temp 98.7°F | Wt 161.6 lb

## 2022-02-08 DIAGNOSIS — I10 Essential (primary) hypertension: Secondary | ICD-10-CM | POA: Diagnosis not present

## 2022-02-08 DIAGNOSIS — R569 Unspecified convulsions: Secondary | ICD-10-CM | POA: Insufficient documentation

## 2022-02-08 NOTE — Progress Notes (Signed)
Tommi Rumps, MD Phone: [phone]  Anne [name] is a 72 y.o. female who presents today for f/u.  Hypertension: Currently taking amlodipine.  No chest pain or shortness of breath.  Seizure-like activity: Patient's husband reports yesterday she woke up with a headache and they tried to treat with Tylenol, ibuprofen, and Excedrin in succession with little benefit.  She noted the headache was severe at that time.  They also noted some nausea and vomiting yesterday.  He was feeding her some soup and suddenly she developed a blank stare and her eyes got wide.  He notes she locked up and had some shaking.  She did not lose control of her bowels or bladder.  He notes this lasted about a minute and over the next 30 or so minutes she returned to her baseline.  He called EMS and they evaluated her and took her to the emergency department.  In the ED she had a negative CT head.  Her lab work did not reveal a cause for her symptoms.  She notes still having some headache though has had no nausea or vomiting today.  She has had no recurrent episodes.  She notes no recent infections.  She did miss all of her medications yesterday.  She does report her father had seizures.  Patient has follow-up with neurology on 02/19/2022.  Social History   Tobacco Use  Smoking Status Former   Packs/day: 2.00   Years: 50.00   Total pack years: 100.00   Types: Cigarettes, E-cigarettes   Quit date: 02/07/2012   Years since quitting: 10.0  Smokeless Tobacco Never    Current Outpatient Medications on File Prior to Visit  Medication Sig Dispense Refill   amLODipine (NORVASC) 5 MG tablet Take 1 tablet (5 mg total) by mouth daily. 90 tablet 3   buPROPion (WELLBUTRIN XL) 300 MG 24 hr tablet Take 1 tablet by mouth once daily 90 tablet 3   clobetasol cream (TEMOVATE) 1.75 % Apply 1 application topically 2 (two) times daily. Lower back 60 g 1   folic acid (FOLVITE) 1 MG tablet Take 1 tablet by mouth once daily 90 tablet 0    ibuprofen (ADVIL,MOTRIN) 200 MG tablet Take 400 mg by mouth every 8 (eight) hours as needed for headache or moderate pain.     levothyroxine (SYNTHROID) 125 MCG tablet Take 1 tablet (125 mcg total) by mouth daily before breakfast. Take the pill first thing in the morning. Do not take other pills at the same time. 30 tablet 3   ondansetron (ZOFRAN) 4 MG tablet Take 1 tablet (4 mg total) by mouth every 6 (six) hours. 12 tablet 0   pantoprazole (PROTONIX) 40 MG tablet TAKE 1 TABLET BY MOUTH TWICE DAILY BEFORE A MEAL 180 tablet 0   potassium chloride SA (KLOR-CON M) 20 MEQ tablet Take 1 tablet by mouth once daily 14 tablet 0   QUEtiapine (SEROQUEL) 25 MG tablet Take 0.5 tablets (12.5 mg total) by mouth at bedtime. 15 tablet 6   rivastigmine (EXELON) 1.5 MG capsule Take 1 capsule (1.5 mg total) by mouth 2 (two) times daily. 60 capsule 6   rosuvastatin (CRESTOR) 20 MG tablet Take 1 tablet by mouth once daily 90 tablet 0   sertraline (ZOLOFT) 100 MG tablet Take 2 tablets (200 mg total) by mouth daily. 180 tablet 1   Current Facility-Administered Medications on File Prior to Visit  Medication Dose Route Frequency Provider Last Rate Last Admin   denosumab (XGEVA) injection 120 mg  120 mg Subcutaneous Q30 days Sindy Guadeloupe, MD   120 mg at 03/05/19 1525   heparin lock flush 100 UNIT/ML injection              ROS see history of present illness  Objective  Physical Exam Vitals:   02/08/22 0814  BP: 110/70  Pulse: 91  Temp: 98.7 F (37.1 C)  SpO2: 95%    BP Readings from Last 3 Encounters:  02/08/22 110/70  02/07/22 (!) 172/91  01/31/22 (!) 144/90   Wt Readings from Last 3 Encounters:  02/08/22 161 lb 9.6 oz (73.3 kg)  02/07/22 166 lb 0.1 oz (75.3 kg)  01/31/22 166 lb (75.3 kg)    Physical Exam Constitutional:      General: She is not in acute distress.    Appearance: She is not diaphoretic.  Cardiovascular:     Rate and Rhythm: Normal rate and regular rhythm.     Heart sounds:  Normal heart sounds.  Pulmonary:     Effort: Pulmonary effort is normal.     Breath sounds: Normal breath sounds.  Skin:    General: Skin is warm and dry.  Neurological:     Mental Status: She is alert.     Comments: CN 3-12 intact, 5/5 strength in bilateral biceps, triceps, grip, quads, hamstrings, plantar and dorsiflexion, sensation to light touch intact in bilateral UE and LE, normal gait      Assessment/Plan: Please see individual problem list.  Benign essential HTN Assessment & Plan: Chronic issue.  Adequately controlled.  She will continue amlodipine 5 mg daily.   Seizure-like activity [facility]) Assessment & Plan: Episode is concerning for seizure.  Discussed this could be related to an underlying seizure disorder or missed medications.  There is some potential that this could be related to an underlying structural lesion in her brain given her cancer history though CT imaging was negative.  Discussed that she will likely need an MRI.  I will communicate with her neurologist to help determine the next step in management.  Discussed if she had any recurrent episodes of seizure like activity they need to call 911.     Return in about 3 months (around 05/10/2022).   Tommi Rumps, MD [place]

## 2022-02-08 NOTE — Assessment & Plan Note (Signed)
Chronic issue.  Adequately controlled.  She will continue amlodipine 5 mg daily.

## 2022-02-08 NOTE — Patient Instructions (Signed)
If you have more seizure like activity please call 911 and get evaluated.  I will let you know what the neurologist says when I hear back from them.

## 2022-02-08 NOTE — Assessment & Plan Note (Signed)
Episode is concerning for seizure.  Discussed this could be related to an underlying seizure disorder or missed medications.  There is some potential that this could be related to an underlying structural lesion in her brain given her cancer history though CT imaging was negative.  Discussed that she will likely need an MRI.  I will communicate with her neurologist to help determine the next step in management.  Discussed if she had any recurrent episodes of seizure like activity they need to call 911.

## 2022-02-09 ENCOUNTER — Other Ambulatory Visit: Payer: PPO

## 2022-02-09 ENCOUNTER — Encounter: Payer: Self-pay | Admitting: Oncology

## 2022-02-09 ENCOUNTER — Telehealth: Payer: Self-pay

## 2022-02-09 DIAGNOSIS — Z515 Encounter for palliative care: Secondary | ICD-10-CM

## 2022-02-09 NOTE — Telephone Encounter (Signed)
        Patient  visited [place] on 1/3     Telephone encounter attempt :  1st  A HIPAA compliant voice message was left requesting a return call.  Instructed patient to call back .    [place], Care Management  [phone] [address], [place], [place] [address] Phone: [phone] Email: [email or web address]

## 2022-02-09 NOTE — Progress Notes (Signed)
PATIENT NAME: QIANA LANDGREBE DOB: 12-08-1950 MRN: [id number]  PRIMARY CARE PROVIDER: Leone Haven, MD  RESPONSIBLE PARTY:  Acct ID - Guarantor Home Phone Work Phone Relationship Acct Type  [id number] [phone]  Self P/F     [address] [place], [place] [address]   Arrived at patient's home for scheduled visit.  No one answered the door.  Phone call made to patient and spouse and no answer.  Message left requesting a call back.    Lorenza Burton, RN

## 2022-02-12 ENCOUNTER — Telehealth: Payer: Self-pay

## 2022-02-12 NOTE — Telephone Encounter (Signed)
     Patient  visit on 1/3  at [facility]   Have you been able to follow up with your primary care physician? No   The patient was or was not able to obtain any needed medicine or equipment. Yes   Are there diet recommendations that you are having difficulty following?NA  Patient expresses understanding of discharge instructions and education provided has no other needs at this time. Yes        [place], [facility], Care Management  [phone] [address], [place], [place] [address] Phone: [phone] Email: [email or web address]

## 2022-02-19 ENCOUNTER — Inpatient Hospital Stay: Payer: PPO | Attending: Oncology | Admitting: Hospice and Palliative Medicine

## 2022-02-19 ENCOUNTER — Ambulatory Visit (INDEPENDENT_AMBULATORY_CARE_PROVIDER_SITE_OTHER): Payer: PPO | Admitting: Neurology

## 2022-02-19 ENCOUNTER — Encounter: Payer: Self-pay | Admitting: Neurology

## 2022-02-19 VITALS — BP 148/99 | HR 78 | Ht 68.0 in | Wt 166.5 lb

## 2022-02-19 DIAGNOSIS — C7951 Secondary malignant neoplasm of bone: Secondary | ICD-10-CM | POA: Diagnosis not present

## 2022-02-19 DIAGNOSIS — E063 Autoimmune thyroiditis: Secondary | ICD-10-CM | POA: Insufficient documentation

## 2022-02-19 DIAGNOSIS — Z79899 Other long term (current) drug therapy: Secondary | ICD-10-CM | POA: Insufficient documentation

## 2022-02-19 DIAGNOSIS — C3411 Malignant neoplasm of upper lobe, right bronchus or lung: Secondary | ICD-10-CM

## 2022-02-19 DIAGNOSIS — Z9221 Personal history of antineoplastic chemotherapy: Secondary | ICD-10-CM | POA: Insufficient documentation

## 2022-02-19 DIAGNOSIS — E039 Hypothyroidism, unspecified: Secondary | ICD-10-CM | POA: Diagnosis not present

## 2022-02-19 DIAGNOSIS — G301 Alzheimer's disease with late onset: Secondary | ICD-10-CM

## 2022-02-19 DIAGNOSIS — Z79890 Hormone replacement therapy (postmenopausal): Secondary | ICD-10-CM | POA: Insufficient documentation

## 2022-02-19 DIAGNOSIS — Z515 Encounter for palliative care: Secondary | ICD-10-CM

## 2022-02-19 DIAGNOSIS — F0393 Unspecified dementia, unspecified severity, with mood disturbance: Secondary | ICD-10-CM | POA: Insufficient documentation

## 2022-02-19 DIAGNOSIS — R7989 Other specified abnormal findings of blood chemistry: Secondary | ICD-10-CM | POA: Insufficient documentation

## 2022-02-19 DIAGNOSIS — F02A18 Dementia in other diseases classified elsewhere, mild, with other behavioral disturbance: Secondary | ICD-10-CM

## 2022-02-19 DIAGNOSIS — Z5112 Encounter for antineoplastic immunotherapy: Secondary | ICD-10-CM | POA: Insufficient documentation

## 2022-02-19 DIAGNOSIS — Z8041 Family history of malignant neoplasm of ovary: Secondary | ICD-10-CM | POA: Insufficient documentation

## 2022-02-19 MED ORDER — MEMANTINE HCL 10 MG PO TABS: 10.0000 mg | ORAL_TABLET | Freq: Two times a day (BID) | ORAL | 11 refills | Status: DC

## 2022-02-19 MED ORDER — RIVASTIGMINE TARTRATE 3 MG PO CAPS: 3.0000 mg | ORAL_CAPSULE | Freq: Two times a day (BID) | ORAL | 11 refills | Status: DC

## 2022-02-19 MED ORDER — DIVALPROEX SODIUM 250 MG PO DR TAB: 250.0000 mg | DELAYED_RELEASE_TABLET | Freq: Two times a day (BID) | ORAL | 11 refills | Status: DC

## 2022-02-19 NOTE — Progress Notes (Signed)
[name]  PATIENT: Anne [name] DOB: December 07, 1950  REQUESTING CLINICIAN: Leone Haven, MD HISTORY FROM: Patient and husband  REASON FOR VISIT: Memory decline    HISTORICAL  CHIEF COMPLAINT:  Chief Complaint  Patient presents with   Follow-up    Rm 13. Accompanied by [name]. Reports seizure approx one month ago, was admitted to [facility]. Worsening memory. [place] 12/30.   INTERVAL HISTORY 02/19/2021:  Patient presents today for follow-up, she is accompanied by [facility].  He reports that memory is getting poor and worse.  She is compliant with the Exelon 1.5 mg twice daily but has not seen any improvement.  They are time that she still wants to leave the house but has not done so.  She is on Seroquel 12.5 mg nightly.  Johnny reports that sleep is good.  He is the only one who helps her; He does work however 3 times a week and there are times where patient is at home alone. There is also of a seizure a month ago. Seizures described as staring spell and being unresponsive. Johnny reports the day of the headaches, she complained of bad headaches. She has not had any additional events since leaving the hospital.     INTERVAL HISTORY 08/15/21 Patient presents today with husband for follow up worsening memory, and additional behavioral changes. She has been non compliant with her medications. Husband has noted that patient has been packing up the car thinking she is about ot move. She has been talking about her mother in [place] when her mother passed away.  Currently she is not  driving, last time she drove to [place] for a weddig when there was no wedding, husband has to call a silver alert on her. At home, she does not cook, use the microwave for frozen dinner. Husband reports that she needs help with showeing and dressing. She misplaces items all the time.  They already have home health services that come once a week and there are complaints of multiple  falls.    HISTORY OF PRESENT ILLNESS:  This is a 72 year old woman with past medical history of lung cancer currently getting chemo every 3 to 4 weeks, with bone metastasis, depression (severe during wintertime) hypertension, hypothyroidism, and hyperlipidemia who is presenting with memory decline.  Patient stated that her memory is terrible, she forgets a lot of things.  She has issue with remote memory and also report forgetting recent conversations.  She also misplaces a lot of things.  She currently drives, denies any recent accident, denies being lost in familiar places.  She can handle her financial affairs by herself, denies being late on her bills.  She knows her family members well, do not confuse them with other people.  She has started Prevagen and reports some improvement with the medication.   Husband stated patient is more forgetful than before, and mainly misplace a lot of things.  He has to repeat himself multiple times because patient will keep on asking the same questions over and over.   She has not been wandering outside her house.  She is able to complete all activities of daily living, she is able to cook, clean, bathe and dress herself.     OTHER MEDICAL CONDITIONS: lung cancer chemo in remission but still undergoing treatment every 3 to 4 weeks, Depression (severe during the winter), HTN, Hypothyroidism, HLD   REVIEW OF SYSTEMS: Full 14 system review of systems performed and negative with exception of: as  noted in the HPI   ALLERGIES: Allergies  Allergen Reactions   Bee Venom Swelling    HOME MEDICATIONS: Outpatient Medications Prior to Visit  Medication Sig Dispense Refill   amLODipine (NORVASC) 5 MG tablet Take 1 tablet (5 mg total) by mouth daily. 90 tablet 3   buPROPion (WELLBUTRIN XL) 300 MG 24 hr tablet Take 1 tablet by mouth once daily 90 tablet 3   clobetasol cream (TEMOVATE) 0.05 % Apply 1 application topically 2 (two) times daily. Lower back 60 g 1   folic  acid (FOLVITE) 1 MG tablet Take 1 tablet by mouth once daily (Patient not taking: Reported on 02/19/2022) 90 tablet 0   ibuprofen (ADVIL,MOTRIN) 200 MG tablet Take 400 mg by mouth every 8 (eight) hours as needed for headache or moderate pain.     levothyroxine (SYNTHROID) 125 MCG tablet Take 1 tablet (125 mcg total) by mouth daily before breakfast. Take the pill first thing in the morning. Do not take other pills at the same time. 30 tablet 3   ondansetron (ZOFRAN) 4 MG tablet Take 1 tablet (4 mg total) by mouth every 6 (six) hours. 12 tablet 0   pantoprazole (PROTONIX) 40 MG tablet TAKE 1 TABLET BY MOUTH TWICE DAILY BEFORE A MEAL 180 tablet 0   potassium chloride SA (KLOR-CON M) 20 MEQ tablet Take 1 tablet by mouth once daily 14 tablet 0   QUEtiapine (SEROQUEL) 25 MG tablet Take 0.5 tablets (12.5 mg total) by mouth at bedtime. 15 tablet 6   rosuvastatin (CRESTOR) 20 MG tablet Take 1 tablet by mouth once daily 90 tablet 0   sertraline (ZOLOFT) 100 MG tablet Take 2 tablets (200 mg total) by mouth daily. 180 tablet 1   rivastigmine (EXELON) 1.5 MG capsule Take 1 capsule (1.5 mg total) by mouth 2 (two) times daily. 60 capsule 6   Facility-Administered Medications Prior to Visit  Medication Dose Route Frequency Provider Last Rate Last Admin   denosumab (XGEVA) injection 120 mg  120 mg Subcutaneous Q30 days Creig Hines, MD   120 mg at 03/05/19 1525   heparin lock flush 100 UNIT/ML injection             PAST MEDICAL HISTORY: Past Medical History:  Diagnosis Date   Arthritis    Cirrhosis (HCC)    Dementia (HCC)    Depression    GERD (gastroesophageal reflux disease)    Hyperlipidemia    Hypertension    Lung cancer (HCC)    Metastatic bone cancer     PAST SURGICAL HISTORY: Past Surgical History:  Procedure Laterality Date   APPENDECTOMY  1971   CHOLECYSTECTOMY  1971   COLONOSCOPY WITH PROPOFOL N/A 11/15/2016   Procedure: COLONOSCOPY WITH PROPOFOL;  Surgeon: Wyline Mood, MD;  Location:  [facility] ENDOSCOPY;  Service: Gastroenterology;  Laterality: N/A;   ENDOBRONCHIAL ULTRASOUND Right 07/30/2018   Procedure: ENDOBRONCHIAL ULTRASOUND;  Surgeon: Shane Crutch, MD;  Location: [facility] ORS;  Service: Pulmonary;  Laterality: Right;   ESOPHAGOGASTRODUODENOSCOPY (EGD) WITH PROPOFOL N/A 01/07/2017   Procedure: ESOPHAGOGASTRODUODENOSCOPY (EGD) WITH PROPOFOL;  Surgeon: Wyline Mood, MD;  Location: [facility] ENDOSCOPY;  Service: Gastroenterology;  Laterality: N/A;   LAPAROSCOPY N/A 03/01/2017   Procedure: LAPAROSCOPY DIAGNOSTIC;  Surgeon: Earline Mayotte, MD;  Location: [facility] ORS;  Service: General;  Laterality: N/A;   PORTA CATH INSERTION N/A 08/14/2018   Procedure: PORTA CATH INSERTION;  Surgeon: Annice Needy, MD;  Location: [facility] INVASIVE CV LAB;  Service: Cardiovascular;  Laterality: N/A;  TONSILECTOMY, ADENOIDECTOMY, BILATERAL MYRINGOTOMY AND TUBES  1955   TONSILLECTOMY     VENTRAL HERNIA REPAIR N/A 03/01/2017   10 x 14 CM Ventralight ST mesh, intraperitoneal location.    VENTRAL HERNIA REPAIR N/A 03/01/2017   Procedure: HERNIA REPAIR VENTRAL ADULT;  Surgeon: Earline Mayotte, MD;  Location: [facility] ORS;  Service: General;  Laterality: N/A;    FAMILY HISTORY: Family History  Problem Relation Age of Onset   Hypertension Mother    Ovarian cancer Mother 59   Dementia Mother    Heart disease Father    Stroke Father    Seizures Father    Ovarian cancer Sister        ? dx cancer had hyst.   Breast cancer Neg Hx     SOCIAL HISTORY: Social History   Socioeconomic History   Marital status: Married    Spouse name: Johnny   Number of children: Not on file   Years of education: Not on file   Highest education level: Not on file  Occupational History   Not on file  Tobacco Use   Smoking status: Former    Packs/day: 2.00    Years: 50.00    Total pack years: 100.00    Types: Cigarettes, E-cigarettes    Quit date: 02/07/2012    Years since quitting: 10.0   Smokeless tobacco: Never   Vaping Use   Vaping Use: Former   Start date: 10/06/2013   Devices: uses no liquid  Substance and Sexual Activity   Alcohol use: No    Alcohol/week: 0.0 standard drinks of alcohol   Drug use: No   Sexual activity: Yes  Other Topics Concern   Not on file  Social History Narrative   Married   Retired   Engineer, manufacturing systems level of education    No children    1 cup of coffee   Social Determinants of Health   Financial Resource Strain: Low Risk  (09/08/2020)   Overall Financial Resource Strain (CARDIA)    Difficulty of Paying Living Expenses: Not hard at all  Food Insecurity: No Food Insecurity (09/08/2020)   Hunger Vital Sign    Worried About Running Out of Food in the Last Year: Never true    Ran Out of Food in the Last Year: Never true  Transportation Needs: No Transportation Needs (09/08/2020)   PRAPARE - Administrator, Civil Service (Medical): No    Lack of Transportation (Non-Medical): No  Physical Activity: Unknown (10/29/2018)   Exercise Vital Sign    Days of Exercise per Week: Patient refused    Minutes of Exercise per Session: Patient refused  Stress: No Stress Concern Present (09/08/2020)   [employer] of Occupational Health - Occupational Stress Questionnaire    Feeling of Stress : Not at all  Social Connections: Unknown (09/08/2020)   Social Connection and Isolation Panel [NHANES]    Frequency of Communication with Friends and Family: More than three times a week    Frequency of Social Gatherings with Friends and Family: Not on file    Attends Religious Services: Not on file    Active Member of Clubs or Organizations: Not on file    Attends Banker Meetings: Not on file    Marital Status: Married  Intimate Partner Violence: Not At Risk (09/08/2020)   Humiliation, Afraid, Rape, and Kick questionnaire    Fear of Current or Ex-Partner: No    Emotionally Abused: No    Physically Abused: No  Sexually Abused: No     PHYSICAL  EXAM  GENERAL EXAM/CONSTITUTIONAL: Vitals:  Vitals:   02/19/22 0950  BP: (!) 148/99  Pulse: 78  Weight: 166 lb 8 oz (75.5 kg)  Height: 5\' 8"  (1.727 m)   Body mass index is 25.32 kg/m. Wt Readings from Last 3 Encounters:  02/19/22 166 lb 8 oz (75.5 kg)  02/08/22 161 lb 9.6 oz (73.3 kg)  02/07/22 166 lb 0.1 oz (75.3 kg)   Patient is in no distress; well developed, nourished and groomed; neck is supple  EYES: Visual fields full to confrontation, Extraocular movements intacts,   MUSCULOSKELETAL: Gait, strength, tone, movements noted in Neurologic exam below  NEUROLOGIC: MENTAL STATUS:     01/19/2021    8:32 AM 03/14/2020   10:47 AM 06/04/2017    9:34 AM  MMSE - Mini Mental State Exam  Orientation to time 4 4 5   Orientation to Place 5 5 5   Registration 3 3 3   Attention/ Calculation 2 5 5   Recall 0 0 0  Language- name 2 objects 2 2 2   Language- repeat 1 1 1   Language- follow 3 step command 3 2 3   Language- read & follow direction 1 1 1   Write a sentence 1 1 1   Copy design 1 1 1   Total score 23 25 27       02/19/2022    9:52 AM 08/15/2021    1:32 PM  Montreal Cognitive Assessment   Visuospatial/ Executive (0/5) 1 2  Naming (0/3) 1 2  Attention: Read list of digits (0/2) 2 2  Attention: Read list of letters (0/1) 1 1  Attention: Serial 7 subtraction starting at 100 (0/3) 2 3  Language: Repeat phrase (0/2) 2 2  Language : Fluency (0/1) 0 0  Abstraction (0/2) 1 1  Delayed Recall (0/5) 0 0  Orientation (0/6) 2 3  Total 12 16  Adjusted Score (based on education) 12      CRANIAL NERVE:  2nd, 3rd, 4th, 6th - visual fields full to confrontation, extraocular muscles intact, no nystagmus 5th - facial sensation symmetric 7th - facial strength symmetric 8th - hearing intact 9th - palate elevates symmetrically, uvula midline 11th - shoulder shrug symmetric 12th - tongue protrusion midline  MOTOR:  normal bulk and tone, full strength in the BUE, BLE  SENSORY:   normal and symmetric to light touch  COORDINATION:  finger-nose-finger, fine finger movements normal  GAIT/STATION:  Wide base, does not use an assistive device.    DIAGNOSTIC DATA (LABS, IMAGING, TESTING) - I reviewed patient records, labs, notes, testing and imaging myself where available.  Lab Results  Component Value Date   WBC 5.4 02/07/2022   HGB 13.9 02/07/2022   HCT 44.5 02/07/2022   MCV 89.7 02/07/2022   PLT 147 (L) 02/07/2022      Component Value Date/Time   NA 138 02/07/2022 1817   NA 142 07/17/2021 1347   K 3.6 02/07/2022 1817   CL 98 02/07/2022 1817   CO2 28 02/07/2022 1817   GLUCOSE 115 (H) 02/07/2022 1817   BUN 16 02/07/2022 1817   BUN 12 07/17/2021 1347   CREATININE 0.92 02/07/2022 1817   CREATININE 1.09 02/26/2012 0925   CALCIUM 9.3 02/07/2022 1817   PROT 7.6 01/31/2022 1354   PROT 7.1 07/17/2021 1347   ALBUMIN 3.9 01/31/2022 1354   ALBUMIN 4.5 07/17/2021 1347   AST 32 01/31/2022 1354   ALT 24 01/31/2022 1354   ALKPHOS 76 01/31/2022 1354  BILITOT 0.5 01/31/2022 1354   BILITOT 0.4 07/17/2021 1347   GFRNONAA >60 02/07/2022 1817   GFRNONAA 54 (L) 02/26/2012 0925   GFRAA >60 11/05/2019 2025   GFRAA >60 02/26/2012 0925   Lab Results  Component Value Date   CHOL 177 08/24/2020   HDL 54.90 08/24/2020   LDLCALC 82 08/24/2020   LDLDIRECT 95.0 07/22/2019   TRIG 198.0 (H) 08/24/2020   CHOLHDL 3 08/24/2020   Lab Results  Component Value Date   HGBA1C 6.1 09/12/2016   Lab Results  Component Value Date   VITAMINB12 1,235 (H) 10/01/2018   Lab Results  Component Value Date   TSH 10.841 (H) 01/10/2022    MRI Brain 08/2020 1. No evidence of intracranial metastasis. 2. Evidence of right mandibular head metastasis which is new from 2020 brain MRI and possibly new from a February 2022 bone scan.   Head CT 02/07/2022 1. No acute intracranial abnormality. 2. Chronic microvascular disease. 3. Mild sphenoid sinus and left posterior ethmoid air  cell inflammation.    ASSESSMENT AND PLAN  72 y.o. year old female with history lung cancer on chemo with bone metastasis, depression (severe during wintertime) hypertension, hypothyroidism, and hyperlipidemia who is presenting for her dementia and new seizure.  In terms of dementia, son reports her memory is getting worse.  She is currently on Exelon 1.5 mg twice daily, will increase it to 3 mg twice daily and add Namenda.  I will also start her on Depakote low-dose 250 twice daily, this may help with her seizures and also with some of the depression and abnormal behavior.  Continue with Seroquel 12.5 mg nightly and will increase as indicated.   Again, today her thyroid was noted to be at 10.841, her medication list include Synthroid 125 mcg daily, son has reported that he is helping with the medication but I have still recommended them to follow-up with PCP regarding the abnormal thyroid result.  Follow-up in 6 months or sooner if worse.   follow up with memory decline described as being forgetful, has to repeat herself multiple times and asked the same questions and now with behavioral changes. On exam today, she has worsening Moca score to 16/30. Discussed with patient importance of medication adherence, her last TSH was abnormal at 34.71 may be contributing to her changes in mental status. I will also start her on low dose Seroquel to help with her sleep and abnormal behavior. Follow up in 6 months     1. 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 Hypothyroidism, unspecified type     Patient Instructions  Continue current medications  Increase Exelon to 3 mg twice daily  Start Namenda to 10 mg twice daily  Start Depakote 250 mg twice daily  Follow up with PCP Regarding abnormal thyroid results  Return in 6 months    No orders of the defined types were placed in this encounter.    Meds ordered this encounter  Medications   rivastigmine  (EXELON) 3 MG capsule    Sig: Take 1 capsule (3 mg total) by mouth 2 (two) times daily.    Dispense:  60 capsule    Refill:  11   memantine (NAMENDA) 10 MG tablet    Sig: Take 1 tablet (10 mg total) by mouth 2 (two) times daily.    Dispense:  60 tablet    Refill:  11   divalproex (DEPAKOTE) 250 MG DR tablet    Sig: Take  1 tablet (250 mg total) by mouth 2 (two) times daily.    Dispense:  60 tablet    Refill:  11     Return in about 6 months (around 08/20/2022). Windell Norfolk, MD 02/19/2022, 1:53 PM  [name] [address] [place], [name] [address] [phone]

## 2022-02-19 NOTE — Progress Notes (Signed)
Virtual Visit via Telephone Note  I connected with [name] on 02/19/22 at  1:20 PM EST by telephone and verified that I am speaking with the correct person using two identifiers.  Location: Patient: Home Provider: Clinic   I discussed the limitations, risks, security and privacy concerns of performing an evaluation and management service by telephone and the availability of in person appointments. I also discussed with the patient that there may be a patient responsible charge related to this service. The patient expressed understanding and agreed to proceed.   History of Present Illness: EMMANUELLA [name] is a 72 y.o. 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  She is also had memory loss of unclear etiology.  Patient was referred to palliative care to discuss resources and provide ongoing support.    Observations/Objective: I called and spoke with patient by phone.  Patient lamented about her memory and feels like her "dementia is getting worse."  She saw neurology this morning.  We also discussed her concerns that her daughters are not seeing her regularly and she feels like she is losing out on quality of time with them.  Symptomatically, she feels like she is doing okay.  She feels like she is tolerating treatment well.  Assessment and Plan: Extensive stage SCLC -on chemotherapy.  Reviewed follow-up schedule with patient  Follow Up Instructions: Follow up telephone visit 1-2 months   I discussed the assessment and treatment plan with the patient. The patient was provided an opportunity to ask questions and all were answered. The patient agreed with the plan and demonstrated an understanding of the instructions.   The patient was advised to call back or seek an in-person evaluation if the  symptoms worsen or if the condition fails to improve as anticipated.  I provided 5 minutes of non-face-to-face time during this encounter.   Malachy Moan, NP

## 2022-02-19 NOTE — Patient Instructions (Signed)
Continue current medications  Increase Exelon to 3 mg twice daily  Start Namenda to 10 mg twice daily  Start Depakote 250 mg twice daily  Follow up with PCP Regarding abnormal thyroid results  Return in 6 months

## 2022-02-21 ENCOUNTER — Encounter: Payer: Self-pay | Admitting: Medical Oncology

## 2022-02-21 ENCOUNTER — Inpatient Hospital Stay: Payer: PPO

## 2022-02-21 ENCOUNTER — Inpatient Hospital Stay (HOSPITAL_BASED_OUTPATIENT_CLINIC_OR_DEPARTMENT_OTHER): Payer: PPO | Admitting: Medical Oncology

## 2022-02-21 VITALS — BP 130/84 | HR 85 | Temp 98.4°F | Wt 163.0 lb

## 2022-02-21 DIAGNOSIS — C3411 Malignant neoplasm of upper lobe, right bronchus or lung: Secondary | ICD-10-CM

## 2022-02-21 DIAGNOSIS — E063 Autoimmune thyroiditis: Secondary | ICD-10-CM | POA: Diagnosis not present

## 2022-02-21 DIAGNOSIS — Z9221 Personal history of antineoplastic chemotherapy: Secondary | ICD-10-CM | POA: Diagnosis not present

## 2022-02-21 DIAGNOSIS — C7951 Secondary malignant neoplasm of bone: Secondary | ICD-10-CM | POA: Diagnosis not present

## 2022-02-21 DIAGNOSIS — R7989 Other specified abnormal findings of blood chemistry: Secondary | ICD-10-CM

## 2022-02-21 DIAGNOSIS — Z5112 Encounter for antineoplastic immunotherapy: Secondary | ICD-10-CM

## 2022-02-21 DIAGNOSIS — F0393 Unspecified dementia, unspecified severity, with mood disturbance: Secondary | ICD-10-CM | POA: Diagnosis not present

## 2022-02-21 DIAGNOSIS — Z79899 Other long term (current) drug therapy: Secondary | ICD-10-CM | POA: Diagnosis not present

## 2022-02-21 DIAGNOSIS — Z79890 Hormone replacement therapy (postmenopausal): Secondary | ICD-10-CM | POA: Diagnosis not present

## 2022-02-21 DIAGNOSIS — Z8041 Family history of malignant neoplasm of ovary: Secondary | ICD-10-CM | POA: Diagnosis not present

## 2022-02-21 LAB — CBC WITH DIFFERENTIAL/PLATELET
Abs Immature Granulocytes: 0.02 10*3/uL (ref 0.00–0.07)
Basophils Absolute: 0 10*3/uL (ref 0.0–0.1)
Basophils Relative: 1 %
Eosinophils Absolute: 0.3 10*3/uL (ref 0.0–0.5)
Eosinophils Relative: 4 %
HCT: 39.9 % (ref 36.0–46.0)
Hemoglobin: 12.5 g/dL (ref 12.0–15.0)
Immature Granulocytes: 0 %
Lymphocytes Relative: 14 %
Lymphs Abs: 0.9 10*3/uL (ref 0.7–4.0)
MCH: 27.7 pg (ref 26.0–34.0)
MCHC: 31.3 g/dL (ref 30.0–36.0)
MCV: 88.3 fL (ref 80.0–100.0)
Monocytes Absolute: 0.4 10*3/uL (ref 0.1–1.0)
Monocytes Relative: 6 %
Neutro Abs: 4.8 10*3/uL (ref 1.7–7.7)
Neutrophils Relative %: 75 %
Platelets: 178 10*3/uL (ref 150–400)
RBC: 4.52 MIL/uL (ref 3.87–5.11)
RDW: 15.7 % — ABNORMAL HIGH (ref 11.5–15.5)
WBC: 6.4 10*3/uL (ref 4.0–10.5)
nRBC: 0 % (ref 0.0–0.2)

## 2022-02-21 LAB — COMPREHENSIVE METABOLIC PANEL
ALT: 24 U/L (ref 0–44)
AST: 30 U/L (ref 15–41)
Albumin: 3.9 g/dL (ref 3.5–5.0)
Alkaline Phosphatase: 76 U/L (ref 38–126)
Anion gap: 9 (ref 5–15)
BUN: 17 mg/dL (ref 8–23)
CO2: 28 mmol/L (ref 22–32)
Calcium: 9.4 mg/dL (ref 8.9–10.3)
Chloride: 104 mmol/L (ref 98–111)
Creatinine, Ser: 1.06 mg/dL — ABNORMAL HIGH (ref 0.44–1.00)
GFR, Estimated: 55 mL/min — ABNORMAL LOW (ref >60)
Glucose, Bld: 101 mg/dL — ABNORMAL HIGH (ref 70–99)
Potassium: 3.5 mmol/L (ref 3.5–5.1)
Sodium: 141 mmol/L (ref 135–145)
Total Bilirubin: 0.2 mg/dL — ABNORMAL LOW (ref 0.3–1.2)
Total Protein: 7.5 g/dL (ref 6.5–8.1)

## 2022-02-21 LAB — TSH: TSH: 6.461 u[IU]/mL — ABNORMAL HIGH (ref 0.350–4.500)

## 2022-02-21 MED ORDER — SODIUM CHLORIDE 0.9% FLUSH
10.0000 mL | INTRAVENOUS | Status: DC | PRN
  Filled 2022-02-21: qty 10

## 2022-02-21 MED ORDER — SODIUM CHLORIDE 0.9 % IV SOLN
1200.0000 mg | Freq: Once | INTRAVENOUS | Status: AC
  Administered 2022-02-21: 1200 mg via INTRAVENOUS
  Filled 2022-02-21: qty 20

## 2022-02-21 MED ORDER — HEPARIN SOD (PORK) LOCK FLUSH 100 UNIT/ML IV SOLN
500.0000 [IU] | Freq: Once | INTRAVENOUS | Status: AC | PRN
  Administered 2022-02-21: 500 [IU]
  Filled 2022-02-21: qty 5

## 2022-02-21 MED ORDER — SODIUM CHLORIDE 0.9 % IV SOLN
Freq: Once | INTRAVENOUS | Status: AC
  Filled 2022-02-21: qty 250

## 2022-02-21 NOTE — Patient Instructions (Signed)
[facility] [place]  Discharge Instructions: Thank you for choosing [facility] to provide your oncology and hematology care.  If you have a lab appointment with the Cancer Center, please go directly to the Cancer Center and check in at the registration area.  Wear comfortable clothing and clothing appropriate for easy access to any Portacath or PICC line.   We strive to give you quality time with your provider. You may need to reschedule your appointment if you arrive late (15 or more minutes).  Arriving late affects you and other patients whose appointments are after yours.  Also, if you miss three or more appointments without notifying the office, you may be dismissed from the clinic at the provider's discretion.      For prescription refill requests, have your pharmacy contact our office and allow 72 hours for refills to be completed.    Today you received the following chemotherapy and/or immunotherapy agents- Tecentriq      To help prevent nausea and vomiting after your treatment, we encourage you to take your nausea medication as directed.  BELOW ARE SYMPTOMS THAT SHOULD BE REPORTED IMMEDIATELY: *FEVER GREATER THAN 100.4 F (38 C) OR HIGHER *CHILLS OR SWEATING *NAUSEA AND VOMITING THAT IS NOT CONTROLLED WITH YOUR NAUSEA MEDICATION *UNUSUAL SHORTNESS OF BREATH *UNUSUAL BRUISING OR BLEEDING *URINARY PROBLEMS (pain or burning when urinating, or frequent urination) *BOWEL PROBLEMS (unusual diarrhea, constipation, pain near the anus) TENDERNESS IN MOUTH AND THROAT WITH OR WITHOUT PRESENCE OF ULCERS (sore throat, sores in mouth, or a toothache) UNUSUAL RASH, SWELLING OR PAIN  UNUSUAL VAGINAL DISCHARGE OR ITCHING   Items with * indicate a potential emergency and should be followed up as soon as possible or go to the Emergency Department if any problems should occur.  Please show the CHEMOTHERAPY ALERT CARD or IMMUNOTHERAPY ALERT CARD at check-in to  the Emergency Department and triage nurse.  Should you have questions after your visit or need to cancel or reschedule your appointment, please contact [facility] AT [place]  [phone] and follow the prompts.  Office hours are 8:00 a.m. to 4:30 p.m. Monday - Friday. Please note that voicemails left after 4:00 p.m. may not be returned until the following business day.  We are closed weekends and major holidays. You have access to a nurse at all times for urgent questions. Please call the main number to the clinic [phone] and follow the prompts.  For any non-urgent questions, you may also contact your provider using MyChart. We now offer e-Visits for anyone 81 and older to request care online for non-urgent symptoms. For details visit [email or web address].   Also download the MyChart app! Go to the app store, search "MyChart", open the app, select , and log in with your MyChart username and password.

## 2022-02-21 NOTE — Progress Notes (Signed)
Symptom Management Clinic [facility] at [facility] [phone] [phone]  Patient Care Team: Glori Luis, MD as PCP - General (Family Medicine) Birdie Sons Yehuda Mao, MD as Consulting Physician (Family Medicine) Lemar Livings, Merrily Pew, MD (General Surgery) Glory Buff, RN as Registered Nurse Creig Hines, MD as Consulting Physician (Hematology and Oncology)   Name of the patient: Anne [name]  [id number]  10-07-1950   Oncological History: Small Cell Lung Cancer   Current Treatment: Nancie Neas  Date of visit: 02/26/22  Reason for Consult: Anne [name] is a 72 y.o. female who presents today for consideration of Cycle 19 Tecentriq:  Patient has been Tecentriq every 3 weeks for 53 cycles. She has been tolerating this medication well with little to no side effects. She denies any fevers, night sweats, frequent infections, bleeding or bruising episodes. Since her last visit with Dr. Smith Robert she has established with palliative care regarding some dementia like symptoms that she does not think is chemo related. She was also seen in the ER on 02/07/2022 for seizure like activity however CT did not show any acute process and she improved in ER. She has an upcoming appointment with neurology to discuss these concerns.    Denies any new neurologic complaints. Denies recent fevers or illnesses. Denies any easy bleeding or bruising. Reports good appetite and denies weight loss. Denies chest pain. Denies any nausea, vomiting, constipation, or diarrhea. Denies urinary complaints. Patient offers no further specific complaints today.    PAST MEDICAL HISTORY: Past Medical History:  Diagnosis Date   Arthritis    Cirrhosis (HCC)    Dementia (HCC)    Depression    GERD (gastroesophageal reflux disease)    Hyperlipidemia    Hypertension    Lung cancer (HCC)    Metastatic bone cancer     PAST SURGICAL HISTORY:  Past Surgical History:  Procedure  Laterality Date   APPENDECTOMY  1971   CHOLECYSTECTOMY  1971   COLONOSCOPY WITH PROPOFOL N/A 11/15/2016   Procedure: COLONOSCOPY WITH PROPOFOL;  Surgeon: Wyline Mood, MD;  Location: [facility] ENDOSCOPY;  Service: Gastroenterology;  Laterality: N/A;   ENDOBRONCHIAL ULTRASOUND Right 07/30/2018   Procedure: ENDOBRONCHIAL ULTRASOUND;  Surgeon: Shane Crutch, MD;  Location: [facility] ORS;  Service: Pulmonary;  Laterality: Right;   ESOPHAGOGASTRODUODENOSCOPY (EGD) WITH PROPOFOL N/A 01/07/2017   Procedure: ESOPHAGOGASTRODUODENOSCOPY (EGD) WITH PROPOFOL;  Surgeon: Wyline Mood, MD;  Location: [facility] ENDOSCOPY;  Service: Gastroenterology;  Laterality: N/A;   LAPAROSCOPY N/A 03/01/2017   Procedure: LAPAROSCOPY DIAGNOSTIC;  Surgeon: Earline Mayotte, MD;  Location: [facility] ORS;  Service: General;  Laterality: N/A;   PORTA CATH INSERTION N/A 08/14/2018   Procedure: PORTA CATH INSERTION;  Surgeon: Annice Needy, MD;  Location: [facility] INVASIVE CV LAB;  Service: Cardiovascular;  Laterality: N/A;   TONSILECTOMY, ADENOIDECTOMY, BILATERAL MYRINGOTOMY AND TUBES  1955   TONSILLECTOMY     VENTRAL HERNIA REPAIR N/A 03/01/2017   10 x 14 CM Ventralight ST mesh, intraperitoneal location.    VENTRAL HERNIA REPAIR N/A 03/01/2017   Procedure: HERNIA REPAIR VENTRAL ADULT;  Surgeon: Earline Mayotte, MD;  Location: [facility] ORS;  Service: General;  Laterality: N/A;    HEMATOLOGY/ONCOLOGY HISTORY:  Oncology History  Small cell lung cancer, right upper lobe (HCC)  08/05/2018 Cancer Staging   Staging form: Lung, AJCC 8th Edition - Clinical stage from 08/05/2018: Stage IVB (cT3, cN2, cM1c) - Signed by Creig Hines, MD on 08/06/2018   08/06/2018 Initial Diagnosis   Small cell  lung cancer, right upper lobe (HCC)   08/18/2018 - 06/07/2021 Chemotherapy   Patient is on Treatment Plan : LUNG SCLC Carboplatin + Etoposide + Atezolizumab Induction q21d / Atezolizumab Maintenance q21d     09/27/2021 -  Chemotherapy   Patient is on Treatment Plan :  LUNG SCLC Carboplatin + Etoposide + Atezolizumab Induction q21d x 4 cycles / Atezolizumab Maintenance q21d       ALLERGIES:  is allergic to bee venom.  MEDICATIONS:  Current Outpatient Medications  Medication Sig Dispense Refill   amLODipine (NORVASC) 5 MG tablet Take 1 tablet (5 mg total) by mouth daily. 90 tablet 3   buPROPion (WELLBUTRIN XL) 300 MG 24 hr tablet Take 1 tablet by mouth once daily 90 tablet 3   clobetasol cream (TEMOVATE) 0.05 % Apply 1 application topically 2 (two) times daily. Lower back 60 g 1   divalproex (DEPAKOTE) 250 MG DR tablet Take 1 tablet (250 mg total) by mouth 2 (two) times daily. 60 tablet 11   ibuprofen (ADVIL,MOTRIN) 200 MG tablet Take 400 mg by mouth every 8 (eight) hours as needed for headache or moderate pain.     levothyroxine (SYNTHROID) 125 MCG tablet Take 1 tablet (125 mcg total) by mouth daily before breakfast. Take the pill first thing in the morning. Do not take other pills at the same time. 30 tablet 3   memantine (NAMENDA) 10 MG tablet Take 1 tablet (10 mg total) by mouth 2 (two) times daily. 60 tablet 11   ondansetron (ZOFRAN) 4 MG tablet Take 1 tablet (4 mg total) by mouth every 6 (six) hours. 12 tablet 0   pantoprazole (PROTONIX) 40 MG tablet TAKE 1 TABLET BY MOUTH TWICE DAILY BEFORE A MEAL 180 tablet 0   potassium chloride SA (KLOR-CON M) 20 MEQ tablet Take 1 tablet by mouth once daily 14 tablet 0   QUEtiapine (SEROQUEL) 25 MG tablet Take 0.5 tablets (12.5 mg total) by mouth at bedtime. 15 tablet 6   rivastigmine (EXELON) 3 MG capsule Take 1 capsule (3 mg total) by mouth 2 (two) times daily. 60 capsule 11   rosuvastatin (CRESTOR) 20 MG tablet Take 1 tablet by mouth once daily 90 tablet 0   sertraline (ZOLOFT) 100 MG tablet Take 2 tablets (200 mg total) by mouth daily. 180 tablet 1   folic acid (FOLVITE) 1 MG tablet Take 1 tablet by mouth once daily (Patient not taking: Reported on 02/19/2022) 90 tablet 0   No current facility-administered  medications for this visit.   Facility-Administered Medications Ordered in Other Visits  Medication Dose Route Frequency Provider Last Rate Last Admin   denosumab (XGEVA) injection 120 mg  120 mg Subcutaneous Q30 days Creig Hines, MD   120 mg at 03/05/19 1525   heparin lock flush 100 UNIT/ML injection             VITAL SIGNS: BP 130/84 (BP Location: Right Arm, Patient Position: Sitting)   Pulse 85   Temp 98.4 F (36.9 C) (Tympanic)   Wt 163 lb (73.9 kg)   BMI 24.78 kg/m  Filed Weights   02/21/22 1458  Weight: 163 lb (73.9 kg)    Estimated body mass index is 24.78 kg/m as calculated from the following:   Height as of 02/19/22: 5\' 8"  (1.727 m).   Weight as of this encounter: 163 lb (73.9 kg).  LABS: CBC:    Component Value Date/Time   WBC 6.4 02/21/2022 1354   HGB 12.5 02/21/2022 1354  HCT 39.9 02/21/2022 1354   PLT 178 02/21/2022 1354   MCV 88.3 02/21/2022 1354   NEUTROABS 4.8 02/21/2022 1354   LYMPHSABS 0.9 02/21/2022 1354   MONOABS 0.4 02/21/2022 1354   EOSABS 0.3 02/21/2022 1354   BASOSABS 0.0 02/21/2022 1354   Comprehensive Metabolic Panel:    Component Value Date/Time   NA 141 02/21/2022 1354   NA 142 07/17/2021 1347   K 3.5 02/21/2022 1354   CL 104 02/21/2022 1354   CO2 28 02/21/2022 1354   BUN 17 02/21/2022 1354   BUN 12 07/17/2021 1347   CREATININE 1.06 (H) 02/21/2022 1354   CREATININE 1.09 02/26/2012 0925   GLUCOSE 101 (H) 02/21/2022 1354   CALCIUM 9.4 02/21/2022 1354   AST 30 02/21/2022 1354   ALT 24 02/21/2022 1354   ALKPHOS 76 02/21/2022 1354   BILITOT 0.2 (L) 02/21/2022 1354   BILITOT 0.4 07/17/2021 1347   PROT 7.5 02/21/2022 1354   PROT 7.1 07/17/2021 1347   ALBUMIN 3.9 02/21/2022 1354   ALBUMIN 4.5 07/17/2021 1347    RADIOGRAPHIC STUDIES: CT HEAD WO CONTRAST ( )  Result Date: 02/07/2022 CLINICAL DATA:  Head trauma after seizure.  Headache for 3 days. EXAM: CT HEAD WITHOUT CONTRAST TECHNIQUE: Contiguous axial images were obtained  from the base of the skull through the vertex without intravenous contrast. RADIATION DOSE REDUCTION: This exam was performed according to the departmental dose-optimization program which includes automated exposure control, adjustment of the mA and/or kV according to patient size and/or use of iterative reconstruction technique. COMPARISON:  10/19/2021 FINDINGS: Brain: No evidence of acute infarction, hemorrhage, hydrocephalus, extra-axial collection or mass lesion/mass effect. There is mild patchy low-attenuation within the subcortical and periventricular white matter compatible with chronic microvascular disease. Vascular: No hyperdense vessel or unexpected calcification. Skull: Normal. Negative for fracture or focal lesion. Sinuses/Orbits: There is partial opacification of the sphenoid sinus and left posterior ethmoid air cells. Other: None. IMPRESSION: 1. No acute intracranial abnormality. 2. Chronic microvascular disease. 3. Mild sphenoid sinus and left posterior ethmoid air cell inflammation. Electronically Signed   By: Signa Kell M.D.   On: 02/07/2022 19:02    PERFORMANCE STATUS (ECOG) : 1 - Symptomatic but completely ambulatory  Review of Systems Unless otherwise noted, a complete review of systems is negative.  Physical Exam General: NAD Cardiovascular: regular rate and rhythm Pulmonary: clear ant fields Abdomen: soft, nontender, + bowel sounds GU: no suprapubic tenderness Extremities: no edema, no joint deformities Skin: no rashes Neurological: Weakness but otherwise nonfocal  Assessment and Plan- Patient is a 72 y.o. female    Encounter Diagnoses  Name Primary?   Small cell lung cancer, right upper lobe (HCC)    Autoimmune hypothyroidism Yes   Encounter for antineoplastic immunotherapy    Elevated serum creatinine     Patient appears to be doing well. She is tolerating her chemotherapy regimen well. Her BMP shows a very slight creatinine elevation from 0.92 to 1.06. CBC is  unremarkable. TSH pending at time of visit. Plan will be for her to have her 54th cycle of Tecentriq today and return in 3 weeks for her next cycle.    Patient expressed understanding and was in agreement with this plan. She also understands that She can call clinic at any time with any questions, concerns, or complaints.   Thank you for allowing me to participate in the care of this very pleasant patient.   Time Total: 25  Visit consisted of counseling and education dealing with the  complex and emotionally intense issues of symptom management in the setting of serious illness.Greater than 50%  of this time was spent counseling and coordinating care related to the above assessment and plan.  Signed by: Nelwyn Salisbury, PA-C

## 2022-02-26 ENCOUNTER — Encounter: Payer: Self-pay | Admitting: Oncology

## 2022-03-06 NOTE — Progress Notes (Unsigned)
Tomasita Morrow, NP-C Phone: [phone]  Anne [name] is a 72 y.o. female who presents today for sore throat.  Respiratory illness:  Cough- ***  Congestion- ***   Sinus- ***   Chest- ***  Post nasal drip- ***  Sore throat- ***  Shortness of breath- ***  Fever- ***  Fatigue/Myalgia- *** Headache- *** Nausea/Vomiting- *** Taste disturbance- ***  Smell disturbance- ***  Covid exposure- ***  Covid vaccination- ***  Flu vaccination- ***  Medications- ***   Social History   Tobacco Use  Smoking Status Former   Packs/day: 2.00   Years: 50.00   Total pack years: 100.00   Types: Cigarettes, E-cigarettes   Quit date: 02/07/2012   Years since quitting: 10.0  Smokeless Tobacco Never    Current Outpatient Medications on File Prior to Visit  Medication Sig Dispense Refill   amLODipine (NORVASC) 5 MG tablet Take 1 tablet (5 mg total) by mouth daily. 90 tablet 3   buPROPion (WELLBUTRIN XL) 300 MG 24 hr tablet Take 1 tablet by mouth once daily 90 tablet 3   clobetasol cream (TEMOVATE) 5.09 % Apply 1 application topically 2 (two) times daily. Lower back 60 g 1   divalproex (DEPAKOTE) 250 MG DR tablet Take 1 tablet (250 mg total) by mouth 2 (two) times daily. 60 tablet 11   folic acid (FOLVITE) 1 MG tablet Take 1 tablet by mouth once daily (Patient not taking: Reported on 02/19/2022) 90 tablet 0   ibuprofen (ADVIL,MOTRIN) 200 MG tablet Take 400 mg by mouth every 8 (eight) hours as needed for headache or moderate pain.     levothyroxine (SYNTHROID) 125 MCG tablet Take 1 tablet (125 mcg total) by mouth daily before breakfast. Take the pill first thing in the morning. Do not take other pills at the same time. 30 tablet 3   memantine (NAMENDA) 10 MG tablet Take 1 tablet (10 mg total) by mouth 2 (two) times daily. 60 tablet 11   ondansetron (ZOFRAN) 4 MG tablet Take 1 tablet (4 mg total) by mouth every 6 (six) hours. 12 tablet 0   pantoprazole (PROTONIX) 40 MG tablet TAKE 1 TABLET BY MOUTH  TWICE DAILY BEFORE A MEAL 180 tablet 0   potassium chloride SA (KLOR-CON M) 20 MEQ tablet Take 1 tablet by mouth once daily 14 tablet 0   QUEtiapine (SEROQUEL) 25 MG tablet Take 0.5 tablets (12.5 mg total) by mouth at bedtime. 15 tablet 6   rivastigmine (EXELON) 3 MG capsule Take 1 capsule (3 mg total) by mouth 2 (two) times daily. 60 capsule 11   rosuvastatin (CRESTOR) 20 MG tablet Take 1 tablet by mouth once daily 90 tablet 0   sertraline (ZOLOFT) 100 MG tablet Take 2 tablets (200 mg total) by mouth daily. 180 tablet 1   Current Facility-Administered Medications on File Prior to Visit  Medication Dose Route Frequency Provider Last Rate Last Admin   denosumab (XGEVA) injection 120 mg  120 mg Subcutaneous Q30 days Sindy Guadeloupe, MD   120 mg at 03/05/19 1525   heparin lock flush 100 UNIT/ML injection              ROS see history of present illness  Objective  Physical Exam There were no vitals filed for this visit.  BP Readings from Last 3 Encounters:  02/21/22 130/84  02/19/22 (!) 148/99  02/08/22 110/70   Wt Readings from Last 3 Encounters:  02/21/22 163 lb (73.9 kg)  02/19/22 166 lb 8 oz (75.5 kg)  02/08/22 161 lb 9.6 oz (73.3 kg)    Physical Exam   Assessment/Plan: Please see individual problem list.  There are no diagnoses linked to this encounter.   Health Maintenance: ***  No follow-ups on file.   Tomasita Morrow, NP-C [place]

## 2022-03-07 ENCOUNTER — Ambulatory Visit (INDEPENDENT_AMBULATORY_CARE_PROVIDER_SITE_OTHER): Payer: PPO | Admitting: Nurse Practitioner

## 2022-03-07 ENCOUNTER — Encounter: Payer: Self-pay | Admitting: Nurse Practitioner

## 2022-03-07 VITALS — BP 128/70 | HR 80 | Temp 97.9°F | Ht 68.0 in | Wt 171.3 lb

## 2022-03-07 DIAGNOSIS — J029 Acute pharyngitis, unspecified: Secondary | ICD-10-CM | POA: Diagnosis not present

## 2022-03-07 DIAGNOSIS — R6889 Other general symptoms and signs: Secondary | ICD-10-CM

## 2022-03-07 DIAGNOSIS — Z1152 Encounter for screening for COVID-19: Secondary | ICD-10-CM | POA: Diagnosis not present

## 2022-03-07 LAB — POCT INFLUENZA A/B
Influenza A, POC: NEGATIVE
Influenza B, POC: NEGATIVE

## 2022-03-07 LAB — POC COVID19 BINAXNOW: SARS Coronavirus 2 Ag: NEGATIVE

## 2022-03-07 NOTE — Patient Instructions (Signed)
It was nice to meet you.   Please seek care if your symptoms are worsening or changing.

## 2022-03-07 NOTE — Assessment & Plan Note (Signed)
Do not suspect bacterial at this time, likely viral. Exam normal. COVID and Flu in office were negative. Encouraged patient to continue OTC symptomatic treatment. Counseled on adequate fluid intake, warm fluids and throat lozenges. Advised patient to seek care if symptoms are worsening or changing.

## 2022-03-14 ENCOUNTER — Inpatient Hospital Stay: Payer: PPO

## 2022-03-14 ENCOUNTER — Inpatient Hospital Stay: Payer: PPO | Attending: Oncology

## 2022-03-14 ENCOUNTER — Inpatient Hospital Stay (HOSPITAL_BASED_OUTPATIENT_CLINIC_OR_DEPARTMENT_OTHER): Payer: PPO | Admitting: Oncology

## 2022-03-14 ENCOUNTER — Encounter: Payer: Self-pay | Admitting: Oncology

## 2022-03-14 VITALS — BP 133/94 | HR 97 | Temp 97.9°F | Resp 18 | Ht 65.0 in | Wt 158.0 lb

## 2022-03-14 DIAGNOSIS — Z5112 Encounter for antineoplastic immunotherapy: Secondary | ICD-10-CM | POA: Diagnosis not present

## 2022-03-14 DIAGNOSIS — C3411 Malignant neoplasm of upper lobe, right bronchus or lung: Secondary | ICD-10-CM

## 2022-03-14 DIAGNOSIS — F0393 Unspecified dementia, unspecified severity, with mood disturbance: Secondary | ICD-10-CM | POA: Diagnosis not present

## 2022-03-14 DIAGNOSIS — E063 Autoimmune thyroiditis: Secondary | ICD-10-CM | POA: Diagnosis not present

## 2022-03-14 DIAGNOSIS — Z9221 Personal history of antineoplastic chemotherapy: Secondary | ICD-10-CM | POA: Diagnosis not present

## 2022-03-14 DIAGNOSIS — Z79890 Hormone replacement therapy (postmenopausal): Secondary | ICD-10-CM | POA: Insufficient documentation

## 2022-03-14 DIAGNOSIS — Z8041 Family history of malignant neoplasm of ovary: Secondary | ICD-10-CM | POA: Diagnosis not present

## 2022-03-14 DIAGNOSIS — Z79620 Long term (current) use of immunosuppressive biologic: Secondary | ICD-10-CM | POA: Diagnosis not present

## 2022-03-14 DIAGNOSIS — C7951 Secondary malignant neoplasm of bone: Secondary | ICD-10-CM | POA: Diagnosis not present

## 2022-03-14 LAB — CBC WITH DIFFERENTIAL/PLATELET
Abs Immature Granulocytes: 0.04 10*3/uL (ref 0.00–0.07)
Basophils Absolute: 0.1 10*3/uL (ref 0.0–0.1)
Basophils Relative: 1 %
Eosinophils Absolute: 0.1 10*3/uL (ref 0.0–0.5)
Eosinophils Relative: 1 %
HCT: 40.9 % (ref 36.0–46.0)
Hemoglobin: 12.6 g/dL (ref 12.0–15.0)
Immature Granulocytes: 1 %
Lymphocytes Relative: 13 %
Lymphs Abs: 1.2 10*3/uL (ref 0.7–4.0)
MCH: 27.6 pg (ref 26.0–34.0)
MCHC: 30.8 g/dL (ref 30.0–36.0)
MCV: 89.5 fL (ref 80.0–100.0)
Monocytes Absolute: 0.6 10*3/uL (ref 0.1–1.0)
Monocytes Relative: 6 %
Neutro Abs: 7 10*3/uL (ref 1.7–7.7)
Neutrophils Relative %: 78 %
Platelets: 254 10*3/uL (ref 150–400)
RBC: 4.57 MIL/uL (ref 3.87–5.11)
RDW: 16.3 % — ABNORMAL HIGH (ref 11.5–15.5)
WBC: 8.9 10*3/uL (ref 4.0–10.5)
nRBC: 0 % (ref 0.0–0.2)

## 2022-03-14 LAB — COMPREHENSIVE METABOLIC PANEL
ALT: 24 U/L (ref 0–44)
AST: 37 U/L (ref 15–41)
Albumin: 3.5 g/dL (ref 3.5–5.0)
Alkaline Phosphatase: 95 U/L (ref 38–126)
Anion gap: 10 (ref 5–15)
BUN: 15 mg/dL (ref 8–23)
CO2: 30 mmol/L (ref 22–32)
Calcium: 9.1 mg/dL (ref 8.9–10.3)
Chloride: 104 mmol/L (ref 98–111)
Creatinine, Ser: 0.96 mg/dL (ref 0.44–1.00)
GFR, Estimated: >60 mL/min (ref >60)
Glucose, Bld: 102 mg/dL — ABNORMAL HIGH (ref 70–99)
Potassium: 3.3 mmol/L — ABNORMAL LOW (ref 3.5–5.1)
Sodium: 144 mmol/L (ref 135–145)
Total Bilirubin: 0.3 mg/dL (ref 0.3–1.2)
Total Protein: 7.2 g/dL (ref 6.5–8.1)

## 2022-03-14 MED ORDER — HEPARIN SOD (PORK) LOCK FLUSH 100 UNIT/ML IV SOLN
500.0000 [IU] | Freq: Once | INTRAVENOUS | Status: AC | PRN
  Administered 2022-03-14: 500 [IU]
  Filled 2022-03-14: qty 5

## 2022-03-14 MED ORDER — SODIUM CHLORIDE 0.9% FLUSH
10.0000 mL | INTRAVENOUS | Status: DC | PRN
  Administered 2022-03-14: 10 mL
  Filled 2022-03-14: qty 10

## 2022-03-14 MED ORDER — SODIUM CHLORIDE 0.9 % IV SOLN
Freq: Once | INTRAVENOUS | Status: AC
  Filled 2022-03-14: qty 250

## 2022-03-14 MED ORDER — SODIUM CHLORIDE 0.9 % IV SOLN
1200.0000 mg | Freq: Once | INTRAVENOUS | Status: AC
  Administered 2022-03-14: 1200 mg via INTRAVENOUS
  Filled 2022-03-14: qty 20

## 2022-03-14 NOTE — Progress Notes (Signed)
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  She has been referred to [place] for findings of bone metastases and her recent MRI.  She has a prior history of 3 packs/day day smoking for over 45 years and quit smoking 5 years ago.She had a CT chest abdomen pelvis in 2018 which showed a 5 mm lung nodule in the left lower lobe.  Recently over the last 2 months patient has been having worsening back pain and was referred to orthopedics.  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  Pathologic fracture of S1.   PET scan showed 2 RUL lung nodules, hilar and mediastinal adenopathy and widespread bone mets. MRI brain negative.   Patient completed palliative RT to her spine. Bronchoscopy showed small cell lung cancer.  Palliative carboplatin, etoposide and Tecentriq started on 08/18/2018. Scans after 4 cycles showed stable disease. 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  She reports being compliant but her values  fluctuate widely.    Interval history-she denies any complaints at this time and tolerating treatment well.  She was seen by neurology recently for her dementia and Depakote was added to Seroquel.  She is also on Exelon and Namenda.  ECOG PS- 2 Pain scale- 0  Review of systems- Review of Systems  Constitutional:  Negative for chills, fever, malaise/fatigue and weight loss.  HENT:  Negative for congestion, ear discharge and nosebleeds.   Eyes:  Negative for blurred vision.  Respiratory:  Negative for cough, hemoptysis, sputum production, shortness of breath and wheezing.   Cardiovascular:  Negative for chest pain, palpitations, orthopnea and claudication.  Gastrointestinal:  Negative for abdominal pain, blood in stool, constipation, diarrhea, heartburn, melena, nausea and vomiting.  Genitourinary:  Negative for dysuria, flank pain, frequency, hematuria and urgency.  Musculoskeletal:  Negative for back pain, joint pain and myalgias.  Skin:  Negative for rash.  Neurological:  Negative for dizziness, tingling, focal weakness, seizures, weakness and headaches.  Endo/Heme/Allergies:  Does not bruise/bleed easily.  Psychiatric/Behavioral:  Negative for depression and suicidal ideas. The patient does not have insomnia.       Allergies  Allergen Reactions   Bee Venom Swelling     Past Medical History:  Diagnosis Date   Arthritis    Cirrhosis ([place])    Dementia ([place])    Depression    GERD (gastroesophageal reflux disease)    Hyperlipidemia    Hypertension    Lung cancer ([place])    Metastatic bone cancer      Past Surgical History:  Procedure Laterality Date   [place]  COLONOSCOPY WITH PROPOFOL N/A 11/15/2016   Procedure: COLONOSCOPY WITH PROPOFOL;  Surgeon: Jonathon Bellows, MD;  Location: [facility] ENDOSCOPY;  Service: Gastroenterology;  Laterality: N/A;   ENDOBRONCHIAL ULTRASOUND Right 07/30/2018   Procedure: ENDOBRONCHIAL ULTRASOUND;  Surgeon:  Laverle Hobby, MD;  Location: [facility] ORS;  Service: Pulmonary;  Laterality: Right;   ESOPHAGOGASTRODUODENOSCOPY (EGD) WITH PROPOFOL N/A 01/07/2017   Procedure: ESOPHAGOGASTRODUODENOSCOPY (EGD) WITH PROPOFOL;  Surgeon: Jonathon Bellows, MD;  Location: [facility] ENDOSCOPY;  Service: Gastroenterology;  Laterality: N/A;   LAPAROSCOPY N/A 03/01/2017   Procedure: LAPAROSCOPY DIAGNOSTIC;  Surgeon: Robert Bellow, MD;  Location: [facility] ORS;  Service: General;  Laterality: N/A;   PORTA CATH INSERTION N/A 08/14/2018   Procedure: PORTA CATH INSERTION;  Surgeon: Algernon Huxley, MD;  Location: [place] CV LAB;  Service: Cardiovascular;  Laterality: N/A;   TONSILECTOMY, ADENOIDECTOMY, BILATERAL MYRINGOTOMY AND TUBES  1955   TONSILLECTOMY     VENTRAL HERNIA REPAIR N/A 03/01/2017   10 x 14 CM Ventralight ST mesh, intraperitoneal location.    VENTRAL HERNIA REPAIR N/A 03/01/2017   Procedure: HERNIA REPAIR VENTRAL ADULT;  Surgeon: Robert Bellow, MD;  Location: [facility] ORS;  Service: General;  Laterality: N/A;    Social History   Socioeconomic History   Marital status: Married    Spouse name: Johnny   Number of children: Not on file   Years of education: Not on file   Highest education level: Not on file  Occupational History   Not on file  Tobacco Use   Smoking status: Former    Packs/day: 2.00    Years: 50.00    Total pack years: 100.00    Types: Cigarettes, E-cigarettes    Quit date: 02/07/2012    Years since quitting: 10.1   Smokeless tobacco: Never  Vaping Use   Vaping Use: Former   Start date: 10/06/2013   Devices: uses no liquid  Substance and Sexual Activity   Alcohol use: No    Alcohol/week: 0.0 standard drinks of alcohol   Drug use: No   Sexual activity: Yes  Other Topics Concern   Not on file  Social History Narrative   Married   Retired   Clinical cytogeneticist level of education    No children    1 cup of coffee   Social Determinants of Health   Financial Resource Strain: Low  Risk  (09/08/2020)   Overall Financial Resource Strain (CARDIA)    Difficulty of Paying Living Expenses: Not hard at all  Food Insecurity: No Food Insecurity (09/08/2020)   Hunger Vital Sign    Worried About Running Out of Food in the Last Year: Never true    [place] in the Last Year: Never true  Transportation Needs: No Transportation Needs (09/08/2020)   PRAPARE - Hydrologist (Medical): No    Lack of Transportation (Non-Medical): No  Physical Activity: Unknown (10/29/2018)   Exercise Vital Sign    Days of Exercise per Week: Patient refused    Minutes of Exercise per Session: Patient refused  Stress: No Stress Concern Present (09/08/2020)   [place]    Feeling of Stress : Not at all  Social Connections: Unknown (09/08/2020)   Social Connection and Isolation Panel [NHANES]    Frequency of Communication with Friends and Family: More than three times a week    Frequency of Social Gatherings with Friends and Family: Not on file    Attends Religious  Services: Not on file    Active Member of Clubs or Organizations: Not on file    Attends Club or Organization Meetings: Not on file    Marital Status: Married  Intimate Partner Violence: Not At Risk (09/08/2020)   Humiliation, Afraid, Rape, and Kick questionnaire    Fear of Current or Ex-Partner: No    Emotionally Abused: No    Physically Abused: No    Sexually Abused: No    Family History  Problem Relation Age of Onset   Hypertension Mother    Ovarian cancer Mother 41   Dementia Mother    Heart disease Father    Stroke Father    Seizures Father    Ovarian cancer Sister        ? dx cancer had hyst.   Breast cancer Neg Hx      Current Outpatient Medications:    amLODipine (NORVASC) 5 MG tablet, Take 1 tablet (5 mg total) by mouth daily., Disp: 90 tablet, Rfl: 3   buPROPion (WELLBUTRIN XL) 300 MG 24 hr tablet, Take 1 tablet by mouth once  daily, Disp: 90 tablet, Rfl: 3   clobetasol cream (TEMOVATE) 1.50 %, Apply 1 application topically 2 (two) times daily. Lower back, Disp: 60 g, Rfl: 1   folic acid (FOLVITE) 1 MG tablet, Take 1 tablet by mouth once daily, Disp: 90 tablet, Rfl: 0   ibuprofen (ADVIL,MOTRIN) 200 MG tablet, Take 400 mg by mouth every 8 (eight) hours as needed for headache or moderate pain., Disp: , Rfl:    sertraline (ZOLOFT) 100 MG tablet, Take 2 tablets (200 mg total) by mouth daily., Disp: 180 tablet, Rfl: 1   divalproex (DEPAKOTE) 250 MG DR tablet, Take 1 tablet (250 mg total) by mouth 2 (two) times daily. (Patient not taking: Reported on 03/14/2022), Disp: 60 tablet, Rfl: 11   levothyroxine (SYNTHROID) 125 MCG tablet, Take 1 tablet (125 mcg total) by mouth daily before breakfast. Take the pill first thing in the morning. Do not take other pills at the same time. (Patient not taking: Reported on 03/07/2022), Disp: 30 tablet, Rfl: 3   memantine (NAMENDA) 10 MG tablet, Take 1 tablet (10 mg total) by mouth 2 (two) times daily. (Patient not taking: Reported on 03/07/2022), Disp: 60 tablet, Rfl: 11   ondansetron (ZOFRAN) 4 MG tablet, Take 1 tablet (4 mg total) by mouth every 6 (six) hours. (Patient not taking: Reported on 03/07/2022), Disp: 12 tablet, Rfl: 0   pantoprazole (PROTONIX) 40 MG tablet, TAKE 1 TABLET BY MOUTH TWICE DAILY BEFORE A MEAL (Patient not taking: Reported on 03/07/2022), Disp: 180 tablet, Rfl: 0   potassium chloride SA (KLOR-CON M) 20 MEQ tablet, Take 1 tablet by mouth once daily (Patient not taking: Reported on 03/07/2022), Disp: 14 tablet, Rfl: 0   QUEtiapine (SEROQUEL) 25 MG tablet, Take 0.5 tablets (12.5 mg total) by mouth at bedtime. (Patient not taking: Reported on 03/07/2022), Disp: 15 tablet, Rfl: 6   rivastigmine (EXELON) 3 MG capsule, Take 1 capsule (3 mg total) by mouth 2 (two) times daily. (Patient not taking: Reported on 03/07/2022), Disp: 60 capsule, Rfl: 11   rosuvastatin (CRESTOR) 20 MG tablet,  Take 1 tablet by mouth once daily (Patient not taking: Reported on 03/07/2022), Disp: 90 tablet, Rfl: 0 No current facility-administered medications for this visit.  Facility-Administered Medications Ordered in Other Visits:    denosumab (XGEVA) injection 120 mg, 120 mg, Subcutaneous, Q30 days, Sindy Guadeloupe, MD, 120 mg at 03/05/19 1525  heparin lock flush 100 UNIT/ML injection, , , ,    sodium chloride flush (NS) 0.9 % injection 10 mL, 10 mL, Intracatheter, PRN, Sindy Guadeloupe, MD, 10 mL at 03/14/22 1452  Physical exam:  Vitals:   03/14/22 1324  BP: (!) 133/94  Pulse: 97  Resp: 18  Temp: 97.9 F (36.6 C)  TempSrc: Tympanic  SpO2: 92%  Weight: 158 lb (71.7 kg)  Height: 5\' 5"  (1.651 m)   Physical Exam Cardiovascular:     Rate and Rhythm: Normal rate and regular rhythm.     Heart sounds: Normal heart sounds.  Pulmonary:     Effort: Pulmonary effort is normal.     Breath sounds: Normal breath sounds.  Abdominal:     General: Bowel sounds are normal.     Palpations: Abdomen is soft.  Skin:    General: Skin is warm and dry.  Neurological:     Mental Status: She is alert.     Comments: Oriented to self         Latest Ref Rng & Units 03/14/2022    1:10 PM  CMP  Glucose 70 - 99 mg/dL 102   BUN 8 - 23 mg/dL 15   Creatinine 0.44 - 1.00 mg/dL 0.96   Sodium 135 - 145 mmol/L 144   Potassium 3.5 - 5.1 mmol/L 3.3   Chloride 98 - 111 mmol/L 104   CO2 22 - 32 mmol/L 30   Calcium 8.9 - 10.3 mg/dL 9.1   Total Protein 6.5 - 8.1 g/dL 7.2   Total Bilirubin 0.3 - 1.2 mg/dL 0.3   Alkaline Phos 38 - 126 U/L 95   AST 15 - 41 U/L 37   ALT 0 - 44 U/L 24       Latest Ref Rng & Units 03/14/2022    1:10 PM  CBC  WBC 4.0 - 10.5 K/uL 8.9   Hemoglobin 12.0 - 15.0 g/dL 12.6   Hematocrit 36.0 - 46.0 % 40.9   Platelets 150 - 400 K/uL 254      Assessment and plan- Patient is a 72 y.o. female with extensive stage small cell lung cancer with bone metastases here for on treatment assessment  prior to cycle 54 of palliative Tecentriq  Counts okay to proceed with cycle 54 of palliative Tecentriq.  I will see her back in 3 weeks for cycle 55.  We will plan to get repeat CT chest abdomen pelvis with contrast and bone scan in April 2024 and if scans are overall stable I will plan to discontinue Tecentriq at that time and watch her off treatment.  Autoimmune hypothyroidism: Will repeat TSH again in 3 weeks and decide about increasing the dose accordingly   Visit Diagnosis 1. Small cell lung cancer, right upper lobe ([place])   2. Encounter for antineoplastic immunotherapy   3. Autoimmune hypothyroidism      Dr. Randa Evens, MD, MPH [facility] at [facility] [phone] 03/14/2022 3:03 PM

## 2022-03-14 NOTE — Patient Instructions (Signed)
[facility]  Discharge Instructions: Thank you for choosing [facility] to provide your oncology and hematology care.  If you have a lab appointment with the [facility], please go directly to the [facility] and check in at the registration area.  Wear comfortable clothing and clothing appropriate for easy access to any Portacath or PICC line.   We strive to give you quality time with your provider. You may need to reschedule your appointment if you arrive late (15 or more minutes).  Arriving late affects you and other patients whose appointments are after yours.  Also, if you miss three or more appointments without notifying the office, you may be dismissed from the clinic at the provider's discretion.      For prescription refill requests, have your pharmacy contact our office and allow 72 hours for refills to be completed.    Today you received the following chemotherapy and/or immunotherapy agents- Tecentriq      To help prevent nausea and vomiting after your treatment, we encourage you to take your nausea medication as directed.  BELOW ARE SYMPTOMS THAT SHOULD BE REPORTED IMMEDIATELY: *FEVER GREATER THAN 100.4 F (38 C) OR HIGHER *CHILLS OR SWEATING *NAUSEA AND VOMITING THAT IS NOT CONTROLLED WITH YOUR NAUSEA MEDICATION *UNUSUAL SHORTNESS OF BREATH *UNUSUAL BRUISING OR BLEEDING *URINARY PROBLEMS (pain or burning when urinating, or frequent urination) *BOWEL PROBLEMS (unusual diarrhea, constipation, pain near the anus) TENDERNESS IN MOUTH AND THROAT WITH OR WITHOUT PRESENCE OF ULCERS (sore throat, sores in mouth, or a toothache) UNUSUAL RASH, SWELLING OR PAIN  UNUSUAL VAGINAL DISCHARGE OR ITCHING   Items with * indicate a potential emergency and should be followed up as soon as possible or go to the Emergency Department if any problems should occur.  Please show the CHEMOTHERAPY ALERT CARD or IMMUNOTHERAPY ALERT CARD at check-in to  the Emergency Department and triage nurse.  Should you have questions after your visit or need to cancel or reschedule your appointment, please contact [facility]  [phone] and follow the prompts.  Office hours are 8:00 a.m. to 4:30 p.m. Monday - Friday. Please note that voicemails left after 4:00 p.m. may not be returned until the following business day.  We are closed weekends and major holidays. You have access to a nurse at all times for urgent questions. Please call the main number to the clinic [phone] and follow the prompts.  For any non-urgent questions, you may also contact your provider using MyChart. We now offer e-Visits for anyone 70 and older to request care online for non-urgent symptoms. For details visit [email or web address].   Also download the MyChart app! Go to the app store, search "MyChart", open the app, select [facility], and log in with your MyChart username and password.

## 2022-03-14 NOTE — Progress Notes (Signed)
Patient has no concerns for the provider/.

## 2022-03-19 ENCOUNTER — Ambulatory Visit (INDEPENDENT_AMBULATORY_CARE_PROVIDER_SITE_OTHER): Payer: PPO | Admitting: Family Medicine

## 2022-03-19 ENCOUNTER — Telehealth: Payer: Self-pay | Admitting: Family Medicine

## 2022-03-19 VITALS — BP 140/88 | HR 80 | Temp 98.2°F | Ht 65.0 in | Wt 158.0 lb

## 2022-03-19 DIAGNOSIS — F332 Major depressive disorder, recurrent severe without psychotic features: Secondary | ICD-10-CM

## 2022-03-19 DIAGNOSIS — F03A18 Unspecified dementia, mild, with other behavioral disturbance: Secondary | ICD-10-CM | POA: Diagnosis not present

## 2022-03-19 NOTE — Telephone Encounter (Signed)
Opened in error

## 2022-03-19 NOTE — Assessment & Plan Note (Addendum)
Chronic issue.  Worsening.  I discussed that her depression could be contributing to this.  The patient will continue Exelon 3 mg twice daily, Seroquel 12.5 mg nightly, Namenda 10 mg twice daily, and Depakote 250 mg twice daily.  She is going to be evaluated by psychiatry today and we can get their input on this as well.

## 2022-03-19 NOTE — Progress Notes (Signed)
Tommi Rumps, MD Phone: [phone]  Anne [name] is a 72 y.o. female who presents today for follow-up.  Dementia/depression: Patient's husband presents with the patient today noting that she has had increased confusion and has been more argumentative over the last 2 weeks.  He notes she wanted to go to "her" house though there is no "her" house.  She is scared someone will come and take her house if the patient's husband has left the house.  Patient notes depression that has been getting worse.  She has had suicidal thoughts and has thought about shooting herself in the head.  Today she notes if she did have a gun she would shoot herself in the head.  She notes she has had these thoughts for months.  At times during the interview she notes the last time she had the thoughts were couple weeks ago though other times she notes a couple of days ago she had these thoughts.  She continues to intermittently have thoughts of shooting herself.  She notes no suicidal intent at this moment.  Her husband reports there is a shotgun in the home though the patient does not know where it is.  She recently saw neurology and they increased her Exelon to 3 mg twice daily.  They also started her on Namenda and Depakote.  Patient notes no numbness, weakness, chest pain, shortness of breath, or dysuria.  No alcohol use.  Her husband does report that she recently spent a day with her daughters which she had not seen in a while and notes that was helpful for the patient.  Social History   Tobacco Use  Smoking Status Former   Packs/day: 2.00   Years: 50.00   Total pack years: 100.00   Types: Cigarettes, E-cigarettes   Quit date: 02/07/2012   Years since quitting: 10.1  Smokeless Tobacco Never    Current Outpatient Medications on File Prior to Visit  Medication Sig Dispense Refill   amLODipine (NORVASC) 5 MG tablet Take 1 tablet (5 mg total) by mouth daily. 90 tablet 3   buPROPion (WELLBUTRIN XL) 300 MG 24 hr  tablet Take 1 tablet by mouth once daily 90 tablet 3   divalproex (DEPAKOTE) 250 MG DR tablet Take 1 tablet (250 mg total) by mouth 2 (two) times daily. 60 tablet 11   ibuprofen (ADVIL,MOTRIN) 200 MG tablet Take 400 mg by mouth every 8 (eight) hours as needed for headache or moderate pain.     levothyroxine (SYNTHROID) 125 MCG tablet Take 1 tablet (125 mcg total) by mouth daily before breakfast. Take the pill first thing in the morning. Do not take other pills at the same time. 30 tablet 3   memantine (NAMENDA) 10 MG tablet Take 1 tablet (10 mg total) by mouth 2 (two) times daily. 60 tablet 11   Multiple Vitamin (MULTIVITAMIN) tablet Take 1 tablet by mouth daily.     Omega-3 Fatty Acids (FISH OIL) 300 MG CAPS Take 1 capsule by mouth daily.     ondansetron (ZOFRAN) 4 MG tablet Take 1 tablet (4 mg total) by mouth every 6 (six) hours. 12 tablet 0   pantoprazole (PROTONIX) 40 MG tablet TAKE 1 TABLET BY MOUTH TWICE DAILY BEFORE A MEAL 180 tablet 0   potassium chloride SA (KLOR-CON M) 20 MEQ tablet Take 1 tablet by mouth once daily 14 tablet 0   QUEtiapine (SEROQUEL) 25 MG tablet Take 0.5 tablets (12.5 mg total) by mouth at bedtime. 15 tablet 6   rivastigmine (  EXELON) 3 MG capsule Take 1 capsule (3 mg total) by mouth 2 (two) times daily. 60 capsule 11   rosuvastatin (CRESTOR) 20 MG tablet Take 1 tablet by mouth once daily 90 tablet 0   sertraline (ZOLOFT) 100 MG tablet Take 2 tablets (200 mg total) by mouth daily. 180 tablet 1   Current Facility-Administered Medications on File Prior to Visit  Medication Dose Route Frequency Provider Last Rate Last Admin   denosumab (XGEVA) injection 120 mg  120 mg Subcutaneous Q30 days Sindy Guadeloupe, MD   120 mg at 03/05/19 1525   heparin lock flush 100 UNIT/ML injection              ROS see history of present illness  Objective  Physical Exam Vitals:   03/19/22 1350  BP: (!) 140/88  Pulse: 80  Temp: 98.2 F (36.8 C)  SpO2: 94%    BP Readings from  Last 3 Encounters:  03/19/22 (!) 140/88  03/14/22 (!) 133/94  03/07/22 128/70   Wt Readings from Last 3 Encounters:  03/19/22 158 lb (71.7 kg)  03/14/22 158 lb (71.7 kg)  03/07/22 171 lb 4.8 oz (77.7 kg)    Physical Exam Constitutional:      General: She is not in acute distress.    Appearance: She is not diaphoretic.  Cardiovascular:     Rate and Rhythm: Normal rate and regular rhythm.     Heart sounds: Normal heart sounds.  Pulmonary:     Effort: Pulmonary effort is normal.     Breath sounds: Normal breath sounds.  Skin:    General: Skin is warm and dry.  Neurological:     Mental Status: She is alert.     Comments: CN 3-12 intact, 5/5 strength in bilateral biceps, triceps, grip, quads, hamstrings, plantar and dorsiflexion, sensation to light touch intact in bilateral UE and LE, normal gait  Psychiatric:     Comments: Mood depressed, affect flat      Assessment/Plan: Please see individual problem list.  Mild dementia with other behavioral disturbance, unspecified dementia type ([place]) Assessment & Plan: Chronic issue.  Worsening.  I discussed that her depression could be contributing to this.  The patient will continue Exelon 3 mg twice daily, Seroquel 12.5 mg nightly, Namenda 10 mg twice daily, and Depakote 250 mg twice daily.  She is going to be evaluated by psychiatry today and we can get their input on this as well.   Severe episode of recurrent major depressive disorder, without psychotic features ([place]) Assessment & Plan: Chronic issue.  Worsened.  Patient is having active suicidal thoughts with thoughts of shooting herself in the head.  She notes no current intent to do this though notes if she had a gun she would shoot herself in the head.  I had a long discussion with the patient and her husband.  Discussed that I felt like she needed to see a psychiatrist emergently for this issue to determine the next step in management.  Patient was agreeable to going for  evaluation.  [place] Department was contacted to assist in transfer for psychiatric evaluation.  Patient voluntarily went with the police department who noted they were going to transfer her to [facility] for further evaluation.      Return in about 3 weeks (around 04/09/2022) for Depression.   Tommi Rumps, MD [place]

## 2022-03-19 NOTE — Assessment & Plan Note (Signed)
Chronic issue.  Worsened.  Patient is having active suicidal thoughts with thoughts of shooting herself in the head.  She notes no current intent to do this though notes if she had a gun she would shoot herself in the head.  I had a long discussion with the patient and her husband.  Discussed that I felt like she needed to see a psychiatrist emergently for this issue to determine the next step in management.  Patient was agreeable to going for evaluation.  [place] Department was contacted to assist in transfer for psychiatric evaluation.  Patient voluntarily went with the police department who noted they were going to transfer her to [facility] for further evaluation.

## 2022-03-22 ENCOUNTER — Emergency Department
Admission: EM | Admit: 2022-03-22 | Discharge: 2022-03-22 | Disposition: A | Discharge status: Home / Self Care | Payer: PPO | Attending: Emergency Medicine | Admitting: Emergency Medicine

## 2022-03-22 ENCOUNTER — Other Ambulatory Visit: Payer: Self-pay

## 2022-03-22 DIAGNOSIS — F0390 Unspecified dementia without behavioral disturbance: Secondary | ICD-10-CM | POA: Insufficient documentation

## 2022-03-22 DIAGNOSIS — N390 Urinary tract infection, site not specified: Secondary | ICD-10-CM | POA: Diagnosis not present

## 2022-03-22 DIAGNOSIS — Z1152 Encounter for screening for COVID-19: Secondary | ICD-10-CM | POA: Insufficient documentation

## 2022-03-22 LAB — ACETAMINOPHEN LEVEL: Acetaminophen (Tylenol), Serum: <10 ug/mL — ABNORMAL LOW (ref 10–30)

## 2022-03-22 LAB — COMPREHENSIVE METABOLIC PANEL
ALT: 24 U/L (ref 0–44)
AST: 35 U/L (ref 15–41)
Albumin: 3.6 g/dL (ref 3.5–5.0)
Alkaline Phosphatase: 84 U/L (ref 38–126)
Anion gap: 9 (ref 5–15)
BUN: 10 mg/dL (ref 8–23)
CO2: 29 mmol/L (ref 22–32)
Calcium: 8.7 mg/dL — ABNORMAL LOW (ref 8.9–10.3)
Chloride: 99 mmol/L (ref 98–111)
Creatinine, Ser: 0.85 mg/dL (ref 0.44–1.00)
GFR, Estimated: >60 mL/min (ref >60)
Glucose, Bld: 83 mg/dL (ref 70–99)
Potassium: 3.4 mmol/L — ABNORMAL LOW (ref 3.5–5.1)
Sodium: 137 mmol/L (ref 135–145)
Total Bilirubin: 0.5 mg/dL (ref 0.3–1.2)
Total Protein: 7.2 g/dL (ref 6.5–8.1)

## 2022-03-22 LAB — URINALYSIS, ROUTINE W REFLEX MICROSCOPIC
Bilirubin Urine: NEGATIVE
Glucose, UA: NEGATIVE mg/dL
Hgb urine dipstick: NEGATIVE
Ketones, ur: NEGATIVE mg/dL
Nitrite: POSITIVE — AB
Protein, ur: NEGATIVE mg/dL
Specific Gravity, Urine: 1.004 — ABNORMAL LOW (ref 1.005–1.030)
pH: 7 (ref 5.0–8.0)

## 2022-03-22 LAB — URINE DRUG SCREEN, QUALITATIVE (ARMC ONLY)
Amphetamines, Ur Screen: NOT DETECTED
Barbiturates, Ur Screen: NOT DETECTED
Benzodiazepine, Ur Scrn: NOT DETECTED
Cannabinoid 50 Ng, Ur ~~LOC~~: NOT DETECTED
Cocaine Metabolite,Ur ~~LOC~~: NOT DETECTED
MDMA (Ecstasy)Ur Screen: NOT DETECTED
Methadone Scn, Ur: NOT DETECTED
Opiate, Ur Screen: NOT DETECTED
Phencyclidine (PCP) Ur S: NOT DETECTED
Tricyclic, Ur Screen: NOT DETECTED

## 2022-03-22 LAB — RESP PANEL BY RT-PCR (RSV, FLU A&B, COVID)  RVPGX2
Influenza A by PCR: NEGATIVE
Influenza B by PCR: NEGATIVE
Resp Syncytial Virus by PCR: NEGATIVE
SARS Coronavirus 2 by RT PCR: NEGATIVE

## 2022-03-22 LAB — VALPROIC ACID LEVEL: Valproic Acid Lvl: 35 ug/mL — ABNORMAL LOW (ref 50.0–100.0)

## 2022-03-22 LAB — CBC WITH DIFFERENTIAL/PLATELET
Abs Immature Granulocytes: 0 10*3/uL (ref 0.00–0.07)
Basophils Absolute: 0 10*3/uL (ref 0.0–0.1)
Basophils Relative: 1 %
Eosinophils Absolute: 0.1 10*3/uL (ref 0.0–0.5)
Eosinophils Relative: 3 %
HCT: 41.1 % (ref 36.0–46.0)
Hemoglobin: 12.5 g/dL (ref 12.0–15.0)
Immature Granulocytes: 0 %
Lymphocytes Relative: 19 %
Lymphs Abs: 1 10*3/uL (ref 0.7–4.0)
MCH: 27.2 pg (ref 26.0–34.0)
MCHC: 30.4 g/dL (ref 30.0–36.0)
MCV: 89.3 fL (ref 80.0–100.0)
Monocytes Absolute: 0.4 10*3/uL (ref 0.1–1.0)
Monocytes Relative: 7 %
Neutro Abs: 3.7 10*3/uL (ref 1.7–7.7)
Neutrophils Relative %: 70 %
Platelets: 173 10*3/uL (ref 150–400)
RBC: 4.6 MIL/uL (ref 3.87–5.11)
RDW: 17.1 % — ABNORMAL HIGH (ref 11.5–15.5)
WBC: 5.1 10*3/uL (ref 4.0–10.5)
nRBC: 0 % (ref 0.0–0.2)

## 2022-03-22 LAB — SALICYLATE LEVEL: Salicylate Lvl: <7 mg/dL — ABNORMAL LOW (ref 7.0–30.0)

## 2022-03-22 LAB — ETHANOL: Alcohol, Ethyl (B): <10 mg/dL (ref <10)

## 2022-03-22 MED ORDER — CEPHALEXIN 500 MG PO CAPS: 500.0000 mg | ORAL_CAPSULE | Freq: Four times a day (QID) | ORAL | 0 refills | Status: AC

## 2022-03-22 NOTE — ED Notes (Signed)
Pt belongings taken by the family member.

## 2022-03-22 NOTE — Discharge Instructions (Signed)
Please seek medical attention for any high fevers, chest pain, shortness of breath, change in behavior, persistent vomiting, bloody stool or any other new or concerning symptoms.  

## 2022-03-22 NOTE — ED Notes (Addendum)
Pt and visitor upset with wait time. Pt and visitor screaming at this RN that I better "hurry up." Pt and visitor stating "if you're not back in 2 minutes, we are walking out." Pt and visitor educated on ER flow, and that doctor would like to send prescription for abx. Pt and visitor stood in doorway with arms crossed while this RN retrieved DC paperwork from MD.

## 2022-03-22 NOTE — ED Notes (Signed)
[facility] APS contacted by this RN. Requesting information as to what prompted an APS report to be generated. Ex husband (who is with patient) does not know why there is an APS report.

## 2022-03-22 NOTE — ED Triage Notes (Signed)
Patient here with ex husband for concerns of worsening dementia/confusion and behavioral changes; Per EMR, patient has become more argumentative over the last 2 weeks; She has also had multiple falls at her home (last fall was approx. 1 month ago per ex husband)

## 2022-03-22 NOTE — ED Provider Notes (Signed)
[facility] Provider Note    Event Date/Time   First MD Initiated Contact with Patient 03/22/22 1956     (approximate)   History   Evaluation  HPI  Anne [name] is a 72 y.o. female  who presents to the emergency department today companied by husband for an evaluation.  The husband states that they were sent here from [facility] for an evaluation but he is not sure why.  Patient denies any thoughts of SI and patient's husband says he has not heard her say anything that would suggest she wants to hurt herself.  He does state that she has a history of dementia and is been a slightly more confused over the past couple of weeks.  No fevers.     Physical Exam   Triage Vital Signs: ED Triage Vitals  Enc Vitals Group     BP 03/22/22 1811 (!) 148/93     Pulse Rate 03/22/22 1811 75     Resp 03/22/22 1811 18     Temp 03/22/22 1811 98.5 F (36.9 C)     Temp Source 03/22/22 1811 Oral     SpO2 03/22/22 1811 95 %     Weight 03/22/22 1823 158 lb 1.1 oz (71.7 kg)     Height 03/22/22 1823 5\' 5"  (1.651 m)     Head Circumference --      Peak Flow --      Pain Score 03/22/22 1823 0     Pain Loc --      Pain Edu? --      Excl. in [place]? --     Most recent vital signs: Vitals:   03/22/22 1811  BP: (!) 148/93  Pulse: 75  Resp: 18  Temp: 98.5 F (36.9 C)  SpO2: 95%   General: Awake, alert, not completely oriented. CV:  Good peripheral perfusion. Regular rate and rhythm. Resp:  Normal effort. Lungs clear. Abd:  No distention. Non tender.   ED Results / Procedures / Treatments   Labs (all labs ordered are listed, but only abnormal results are displayed) Labs Reviewed  CBC WITH DIFFERENTIAL/PLATELET - Abnormal; Notable for the following components:      Result Value   RDW 17.1 (*)    All other components within normal limits  COMPREHENSIVE METABOLIC PANEL - Abnormal; Notable for the following components:   Potassium 3.4 (*)    Calcium 8.7 (*)    All other  components within normal limits  URINALYSIS, ROUTINE W REFLEX MICROSCOPIC - Abnormal; Notable for the following components:   Color, Urine YELLOW (*)    APPearance CLEAR (*)    Specific Gravity, Urine 1.004 (*)    Nitrite POSITIVE (*)    Leukocytes,Ua SMALL (*)    Bacteria, UA RARE (*)    All other components within normal limits  SALICYLATE LEVEL - Abnormal; Notable for the following components:   Salicylate Lvl <6.8 (*)    All other components within normal limits  ACETAMINOPHEN LEVEL - Abnormal; Notable for the following components:   Acetaminophen (Tylenol), Serum <10 (*)    All other components within normal limits  VALPROIC ACID LEVEL - Abnormal; Notable for the following components:   Valproic Acid Lvl 35 (*)    All other components within normal limits  RESP PANEL BY RT-PCR (RSV, FLU A&B, COVID)  RVPGX2  URINE DRUG SCREEN, QUALITATIVE ([facility] ONLY)  ETHANOL     EKG  None   RADIOLOGY None   PROCEDURES:  Critical Care performed: No  Procedures   MEDICATIONS ORDERED IN ED: Medications - No data to display   IMPRESSION / MDM / [place] / ED COURSE  I reviewed the triage vital signs and the nursing notes.                              Differential diagnosis includes, but is not limited to, dementia, infection, medication side effect  Patient's presentation is most consistent with acute presentation with potential threat to life or bodily function.  Patient presented to the emergency department today for a "evaluation".  It is unclear why she was sent here for an evaluation.  The husband is unsure as well as the patient.  He states that they were supposed to see psychiatry although denies any SI or her stating any thoughts about wanting to hurt herself.  Does states she has been somewhat more confused and she is positive for UTI here which I think could explain some increased confusion.  They then requested discharge.  Will give prescription for  antibiotics.   FINAL CLINICAL IMPRESSION(S) / ED DIAGNOSES   Final diagnoses:  Lower urinary tract infection     Note:  This document was prepared using Dragon voice recognition software and may include unintentional dictation errors.    Nance Pear, MD 03/22/22 [phone]

## 2022-03-23 ENCOUNTER — Telehealth: Payer: Self-pay | Admitting: Family Medicine

## 2022-03-23 ENCOUNTER — Telehealth: Payer: Self-pay

## 2022-03-23 NOTE — Transitions of Care (Post Inpatient/ED Visit) (Signed)
   03/23/2022  Name: Anne [name] MRN: [id number] DOB: [birth year]  Today's TOC FU Call Status: Today's TOC FU Call Status:: Successful TOC FU Call Competed TOC FU Call Complete Date: 03/23/22  Transition Care Management Follow-up Telephone Call Date of Discharge: 03/22/22 Discharge Facility: [facility] Type of Discharge: Emergency Department Reason for ED Visit: Other:, Neurologic (UTI) Neurologic Diagnosis: Alzheimer's Disease and Related Disorders How have you been since you were released from the hospital?: Same Any questions or concerns?: Yes Patient Questions/Concerns:: Anne [name], spouse wants to know where to go from here.  He is trying to get help for spouse. Patient Questions/Concerns Addressed: Notified Provider of Patient Questions/Concerns  Items Reviewed: Did you receive and understand the discharge instructions provided?: Yes Medications obtained and verified?: Yes (Medications Reviewed) Any new allergies since your discharge?: No Dietary orders reviewed?: NA Do you have support at home?: Yes People in Home: spouse Name of Support/Comfort Primary Source: [facility], [place] and Equipment/Supplies: [place] Ordered?: No Any new equipment or medical supplies ordered?: NA  Functional Questionnaire: Do you need assistance with bathing/showering or dressing?: No Do you need assistance with meal preparation?: Yes Do you need assistance with eating?: No Do you have difficulty maintaining continence: No Do you need assistance with getting out of bed/getting out of a chair/moving?: No Do you have difficulty managing or taking your medications?: Yes  Folllow up appointments reviewed: PCP Follow-up appointment confirmed?: No MD Provider Line Number:[phone] Given: No Specialist Hospital Follow-up appointment confirmed?: No Do you need transportation to your follow-up appointment?: No Do you understand care options if your condition(s) worsen?: Yes-patient  verbalized understanding  Spoke with [name] who reported that [facility] would not talk with pt on 03/19/22 due to not accepting pt's insurance.  Later this week he went to [employer] and was encouraged to visit [facility] to see if they had any resources to offer.  He was told that the Intake Coordinator was off until 03/27/22 and to call back and make an appointment. Took pt back to [facility] yesterday 03/22/22 and pt was sent to [facility] ED.  He thought that pt was there to receive a psychiatric evaluation. Pt was not seen by psychiatry.  Had a urine test that showed a UTI.  Pt was discharged with an antibiotic.  Per husband pt had her first dose around lunch time today 03/23/22.  While completing TOC spoke with pt who denies any SI at this time.  Education provided to pt and Anne [name] concerning UTI/dementia and that if the increased agitation was due to UTI that he should see an improvement in her mentation this weekend.  Encouraged Anne [name] to take pt back to ED if agitation worsened or if pt voiced concerns of SI.   Dr. Caryl Bis would a community referral be appropriate?  I think SW could help with finding some resources.    Clide Deutscher, RN

## 2022-03-23 NOTE — Telephone Encounter (Signed)
See TOC telephone note.  Concerns addressed in that note.

## 2022-03-23 NOTE — Telephone Encounter (Signed)
Patient was sent to [place] early this week, by provider.  [facility]  refused to talk to her because of her insurance, she was turned away. [place] police took her back to [facility],a day ago. [facility] sent her to [facility] for a mental health evaluation. [facility] ,only took blood and sent her home. Patient's husband doesn't know what else to do. He states the patient's state of mind is getting worse. Patient wants husband to take her home, patient sometimes doesn't know who husband is. Husband's phone number is [phone], or home phone [phone].

## 2022-03-26 ENCOUNTER — Telehealth: Payer: Self-pay | Admitting: Family Medicine

## 2022-03-26 ENCOUNTER — Ambulatory Visit (HOSPITAL_COMMUNITY)
Admission: EM | Admit: 2022-03-26 | Discharge: 2022-03-26 | Disposition: A | Discharge status: Home / Self Care | Payer: PPO | Attending: Psychiatry | Admitting: Psychiatry

## 2022-03-26 ENCOUNTER — Encounter: Payer: Self-pay | Admitting: Family Medicine

## 2022-03-26 ENCOUNTER — Ambulatory Visit (INDEPENDENT_AMBULATORY_CARE_PROVIDER_SITE_OTHER): Payer: PPO | Admitting: Family Medicine

## 2022-03-26 VITALS — BP 118/74 | HR 79 | Temp 98.2°F | Ht 65.0 in | Wt 157.6 lb

## 2022-03-26 DIAGNOSIS — F0283 Dementia in other diseases classified elsewhere, unspecified severity, with mood disturbance: Secondary | ICD-10-CM | POA: Insufficient documentation

## 2022-03-26 DIAGNOSIS — R45851 Suicidal ideations: Secondary | ICD-10-CM | POA: Diagnosis not present

## 2022-03-26 DIAGNOSIS — F32A Depression, unspecified: Secondary | ICD-10-CM | POA: Insufficient documentation

## 2022-03-26 DIAGNOSIS — F03A18 Unspecified dementia, mild, with other behavioral disturbance: Secondary | ICD-10-CM

## 2022-03-26 DIAGNOSIS — R3915 Urgency of urination: Secondary | ICD-10-CM | POA: Insufficient documentation

## 2022-03-26 DIAGNOSIS — I10 Essential (primary) hypertension: Secondary | ICD-10-CM | POA: Insufficient documentation

## 2022-03-26 DIAGNOSIS — C3410 Malignant neoplasm of upper lobe, unspecified bronchus or lung: Secondary | ICD-10-CM | POA: Insufficient documentation

## 2022-03-26 DIAGNOSIS — R413 Other amnesia: Secondary | ICD-10-CM | POA: Diagnosis not present

## 2022-03-26 DIAGNOSIS — F322 Major depressive disorder, single episode, severe without psychotic features: Secondary | ICD-10-CM

## 2022-03-26 DIAGNOSIS — F332 Major depressive disorder, recurrent severe without psychotic features: Secondary | ICD-10-CM | POA: Diagnosis not present

## 2022-03-26 LAB — POCT URINALYSIS DIPSTICK
Bilirubin, UA: NEGATIVE
Blood, UA: NEGATIVE
Glucose, UA: NEGATIVE
Ketones, UA: NEGATIVE
Nitrite, UA: NEGATIVE
Protein, UA: POSITIVE — AB
Spec Grav, UA: 1.015 (ref 1.010–1.025)
Urobilinogen, UA: NEGATIVE E.U./dL — AB
pH, UA: 6.5 (ref 5.0–8.0)

## 2022-03-26 LAB — URINALYSIS, MICROSCOPIC ONLY: RBC / HPF: NONE SEEN (ref 0–?)

## 2022-03-26 NOTE — Assessment & Plan Note (Signed)
Chronic issue.  Seems to be worsening.  Discussed that depression could be contributing to this.  She has a UTI that could be contributing as well.  The patient will continue Exelon 3 mg twice daily, Seroquel 12.5 mg nightly, Namenda 10 mg twice daily, and Depakote 250 mg twice daily.  I am going to communicate with her neurologist to get their input on the situation and we will let the patient's husband know what we come up with after I hear back from neurology.
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
## 2022-03-26 NOTE — Telephone Encounter (Signed)
Pt husband came into the office stating the pt has a UTI and she is taking antiobitcs 4x a day for it but pt husband believe the UTI is making her dementia worse
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
## 2022-03-26 NOTE — Telephone Encounter (Signed)
Discussed situation with Dr. Biagio Quint. PT was scheduled today at the 3:25 slot, but was worked in this morning inbetween patients. Both patient & husband were made aware that this is a work in and there will be a wait time involoved.

## 2022-03-26 NOTE — Assessment & Plan Note (Addendum)
Chronic issue.  Worsened.  She is having passive suicidal thoughts though no intent or plan to harm herself at this time.  I am going to refer her to psychiatry.  I also gave them information on the [facility] where they could take her as well for evaluation and discussed having her go there today for evaluation.  She will continue Zoloft 200 mg daily and Wellbutrin 300 mg daily.  Advised if she develops intent or plan to harm herself they need to call [phone] for police assistance.

## 2022-03-26 NOTE — Assessment & Plan Note (Signed)
Possibly related to UTI.  We will recheck her urine today as they did not send a urine culture.  If she appears to have continued signs of urine infection on her UA I will switch her antibiotic.  For now she will complete her course of Keflex 500 mg 4 times daily.

## 2022-03-26 NOTE — Care Management (Signed)
.. Dementia and Alzheimer's Resources  Base on the information you have provided and the presenting issue, outpatient services and resources for Dementia and Alzheimer's have been recommended.  It is imperative that you follow through with treatment recommendations within 5-7 days from the of discharge to mitigate further risk to your safety and mental well-being. A list of referrals has been provided below to get you started.  You are not limited to the list provided.  In case of an urgent crisis, you may contact the Mobile Crisis Unit with [facility] at [phone].  [place] for [place]: AARP developed this family caregiver guide with you, the caregiver, in mind as a starting point to help you find the services and supports you might need throughout your journey. Alzheimer's Association: The Alzheimer's Association leads the way to end Alzheimer's and all other dementia -- by accelerating global research, driving risk reduction and early detection, and maximizing quality care and support. Dementia Alliance: Dementia Alliance of [place] seeks to improve the lives of [place] impacted by dementia as well as empower their caregivers through support, education and research. As a result, we are able to provide a high degree of personal support throughout the entire journey with dementia. [facility] Dementia Family Support Program: The [facility] Dementia Family Support Program provides education, support and engagement to people living with dementia and their family care partners. Services are offered free of charge, and no affiliation with [facility] is required for participation. Family Caregiver Support: The [facility] Caregiver Support Program offers a range of services to support family caregivers. Healthy Aging [place] (Powerful Tools for Caregivers): Powerful Tools for Caregivers is an educational program designed to help family caregivers take care of  themselves while caring for someone else. Legal Resources for Power of Attorney: If your loved one is experiencing symptoms of dementia or has been diagnosed with dementia, you may be asking when should you obtain powers of attorney? This document can help guide you.  Lifespan Respite Care Program: The [place] focuses on enhancing respite services for unpaid caregivers and growing a lifespan respite care system in [place].  [place] BOLD Brain Health Toolkit: This toolkit aims to increase awareness about the importance of risk reduction, prevention, early diagnosis, and management of Alzheimer's disease and related dementias (ADRD), and the role of caregiving for persons with dementia. It is developed for local public health agencies, community-based organizations, dementia care programs, dementia-capable services within home and community-based service systems, community health workers, [employer], ADRD advocates and other professionals.  [place] Caregiver Portal: This [place] caregiver portal (powered by [place]) provides educational content and an intuitive learning environment to healthcare providers, insurers, and employers in the [place] and [place] that understand the importance of engaging and educating family members in the informal care of aging loved ones.    Registry for Brain Health: The Registry has two purposes. The first is to get the community involved in brain health research by connecting people with research opportunities across [place]. The second purpose is to share information to help people better understand brain health, learn about ways to keep their brains healthy, and find useful resources to assist people who are living with dementia and their caregivers. Options Counseling: Options Counseling provides guidance to individuals as they make informed choices about long-term services and supports. Project CARE (Caregiver Support):  Project CARE is a coordinated delivery system that responds to the needs, values and preferences of  individuals who directly care for a family member or friend with Alzheimer's disease or related dementia (ADRD).      Adult Day Services What are Adult Day Services? Adult Day Care is a supervised program in a community group setting offered during the day to individuals with cognitive and/or physical impairments to promote social, physical and emotional well-being. These programs offer a variety of activities to meet the needs and interests of each older adult.  Adult Day Health Care is a supervised program in a community group setting offered during the day to individuals with cognitive and/or physical impairments that require health monitoring to promote social, physical and emotional well-being. These programs offer a variety of activities to meet the needs and interests of each older adult.   Who qualifies for Adult Day Services? For Adult Day Care, it is individuals who are 71 years of age or older that meet the definition of "frail" and a) are unable to perform at least two activities of daily living without substantial assistance or b) due to a cognitive or other mental impairment require substantial supervision for their safety and others. For Adult Day Health Care, it is individuals who are 35 years of age or older that meet the definition of "frail" and a) are unable to perform at least two activities of daily living without substantial assistance or b) due to a cognitive or other mental impairment require substantial supervision for their safety and others. Additionally, one of the following is required during attendance at the program: monitoring of a medical condition or administration of medication, special feeding, or the provision of other treatment of services related to health care needs.  How do I access the service? Adult Day Care is offered by local service providers. Click on the  links below to find a program in your area. Adult Day Care Provider Directory Adult Day Health Care Provider Directory Important Considerations Adult Day Care would not be appropriate for people who are independent and can manage their own care needs. Adult Day Health Care would not be appropriate for people who are independent and can manage their own care and medical needs. A medical examination is required for admission into a program. These programs may be housed in the same shared space. How do I get more information about Adult Day Services? For more information email the [place] Division of Aging and Adult Services Adult Day Services Program Consultant or call [phone]. Providers that receive certain funding may have additional certification requirements. Certification is required for Adult Day Services providers regardless of funding. [place] Adult Day Paediatric nurse for Certification If you have questions about adult day services contact: The local department of social services The area agency on aging [place] or Chief Executive Officer at the Division of Aging and Adult Services For information about opening an Adult Day Care or building, renovating, Environmental consultant or buying a building where your organization would like to provide Adult Day Care please contact [facility].       Care Management of Frail Older Adults What is Care Management? Care Management assists older adults who have complex care needs by identifying, accessing, and coordinating services necessary for an individual to remain safely at home. This coordinated care function includes case finding, assessment, care plan development and implementation, negotiation, monitoring, and advocacy. Who qualifies for Care Management? Individuals who are 35 years of age or older with complex care needs that affect their activities of daily living. How do I access the  Service? Care Management is offered by local  service providers supported by the [facility]. A list of local providers can be found by clicking the link below. Care Management Provider Directory  How do I get more information about Care Management? For more information email the [place] Division of Aging and Adult Services Care Management Program Consultant or call [phone].       Home Care Independence What is home care independence? Home care independence is a consumer-directed in-home care program that allows an older adult to hire family, friends, or others as a caregiver or Psychologist, sport and exercise. The personal assistant helps individuals who have functional, physical or mental impairments accomplish daily activities. Individuals may require both personal care (e.g. bathing, dressing, feeding) and home management (e.g. cooking, cleaning) assistance to be able to remain safely in their home settings. A care advisor helps the individual consider options and offers advice on how to self-direct care as a participant in the program. Payroll assistance is provided by a financial management service.  Who qualifies for home care independence? Individuals who are 92 years of age or older, live at home, and have home management and/or personal care needs. Individuals must need the service for the following reasons: Individuals are unable to carry out one or more tasks essential to the activities of daily living (e.g. toileting, ambulation) or instrumental activities of daily living (e.g. laundry, shopping), and need help with these tasks to remain in their own home. Individuals must be able to direct their own care or have someone who does this for them.  How do I access the service? The Division of Aging and Adult Services does not directly provide these services.  The division provides financial support through grant funding for local providers to offer these services and unfortunately this service is not provided in every  county. Click on the link below to find a local provider. Home Care Independence Provider Directory What else should I consider? A person who directs the care for an individual cannot also be the paid caregiver. This service is appropriate for individuals whose needs can be met safely in the home by family members or other informal caregivers. This service does not provide enough assistance to replace facility-based services for individuals who require ongoing care, supervision, or monitoring by a nurse or other health care professional. Community Alternatives Program/DA is a Medicaid Home and [place] (HCBS) waiver program that provides a cost-effective alternative to institutionalization for a Medicaid beneficiary who is medically fragile and at risk for institutionalization. Consumer-direction is a service delivery model that allows a CAP/DA Medicaid beneficiary or designated representative to act in the role of employer of record to direct their personal care services. If you have Medicaid refer to: Community Alternatives Program for Disabled Adults (CAP/DA)  [place] Medicaid (CompanySummit.is)   How do I learn more about home care independence? For more information email the [place] Division of Aging and [place] Consultant or call [phone].              Dementia Alliance Caregiver Assistance Program                     [place] [phone]  [place] [address]                                                             [phone] This program is designed to support family caregivers throughout the state by awarding one-time financial assistance or reimbursement during a time of need, allowing caregivers temporary relief (respite) or resources for their caregiver journey.  The program is for care partners of people diagnosed with dementia and living at home.   Before applying, have you received services from  your local [place] on Aging (AAA) or Project C.A.R.E.? If not, your first step should be to contact those agencies for respite services.  Area Agency on [place]. Click the link below to find the designated AAA for your county. EmployeeVerified.it- services/daas-area-agencies-aging Project C.A.R.E. (Caregiver Alternatives to [place]). Click the link below to find the [employer] for your county. DNSWeekly.fi If you have already consulted with these programs, please continue with the next steps to apply for the [place] Program. SPECIAL NOTE: Our program is funded by private donors and no government funding is collected to administer this program.  Therefore, our respite vouchers are a maximum of $500 and are not designed to provide ongoing means of financial support. Eligibility: A formal diagnosis of dementia (including but not limited to these diseases which cause dementia: Alzheimer's, Lewy Body, Vascular, Frontotemporal degeneration and its variations). The person diagnosed with dementia must be living at home. Caregiver and person living with dementia must reside in the state of [place].  Services requested must be directly related to the person living with dementia. Funds received may be used to hire a Brewing technologist or private caregiver. Services must start with 30 days of approval and be completed within 90 days. Caregiver Assistance Funds are NOT: An ongoing financial means for the caregivers nor the person living with dementia. To pay for vacations or other caregiver expenses. Designed to pay for ongoing care. For those who have been approved for or are receiving Medicaid. For caregivers caring for individuals living in a facility full time. Please also be  aware of the following: Funding is one-time assistance. Funding is awarded on greatest need. Application must be completed by family or with assistance of an Agency.  If you meet all previous guidelines, please complete the application below.  You will be notified of our decision within 10 business days. If you have questions please contact DeeDee Harris at [email or web address] or [phone].

## 2022-03-26 NOTE — Patient Instructions (Signed)
Nice to see you.   I am going to send a message to your neurologist and I will let you know what they say regarding the dementia. I am going to refer you to psychiatry though if you develop a plan or intent to harm yourself please call 911. You could also take her to the [facility] in [place] for evaluation as well.

## 2022-03-26 NOTE — Telephone Encounter (Signed)
Reviewed.  Patient was seen in the office today.

## 2022-03-26 NOTE — Telephone Encounter (Signed)
ROI from [place] received and fax to [facility] records.

## 2022-03-27 ENCOUNTER — Telehealth: Payer: Self-pay | Admitting: Family Medicine

## 2022-03-27 ENCOUNTER — Other Ambulatory Visit: Payer: Self-pay | Admitting: Neurology

## 2022-03-27 ENCOUNTER — Telehealth: Payer: Self-pay

## 2022-03-27 LAB — URINE CULTURE
MICRO NUMBER:: 14583209
Result:: NO GROWTH
SPECIMEN QUALITY:: ADEQUATE

## 2022-03-27 NOTE — Telephone Encounter (Signed)
Error

## 2022-03-27 NOTE — Telephone Encounter (Signed)
Please let the patient's husband know that I heard back from the patient's neurologist.  They noted that there is not a whole lot else they can do other than help try to manage symptoms.  There are a couple other medication options that they could potentially consider though they would need to have discussion during a visit to potentially start those.  I do think it would be a good idea to have home health care come out with nursing and social worker to see what the options are to get them additional help at home.  They may also need to start looking for nursing home placement with memory care as an option.  I can certainly place a referral for home health care if they are willing to let them come into the home.  Please also find out if the husband took her to the [facility] yesterday as we discussed.

## 2022-03-27 NOTE — Telephone Encounter (Signed)
Left message on Patient's husband cell phone to please call the office back

## 2022-03-27 NOTE — Telephone Encounter (Signed)
Attempted to call Patient's husband- no answer or voicemail
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
## 2022-03-28 ENCOUNTER — Telehealth: Payer: Self-pay

## 2022-03-28 NOTE — Telephone Encounter (Signed)
Pt husband returned call. Note below was read to him. He's aware of the results.

## 2022-03-28 NOTE — Telephone Encounter (Signed)
-----   Message from Leone Haven, MD sent at 03/28/2022 12:04 PM EST ----- Please let the patients husband know that her urine culture was negative for infection. This means that the antibiotic she is on should be helping presuming she initially had an infection.

## 2022-03-28 NOTE — Telephone Encounter (Signed)
LMTCB in regards to lab results.  

## 2022-03-30 ENCOUNTER — Telehealth: Payer: Self-pay | Admitting: *Deleted

## 2022-03-30 ENCOUNTER — Telehealth: Payer: Self-pay | Admitting: Pharmacist

## 2022-03-30 DIAGNOSIS — F03A18 Unspecified dementia, mild, with other behavioral disturbance: Secondary | ICD-10-CM

## 2022-03-30 NOTE — Progress Notes (Signed)
  Care Coordination  Outreach Note  03/30/2022 Name: MEKA LEWAN MRN: [id number] DOB: May 10, 1950   Care Coordination Outreach Attempts: An unsuccessful telephone outreach was attempted today to offer the patient information about available care coordination services as a benefit of their health plan.   Referral received   Follow Up Plan:  Additional outreach attempts will be made to offer the patient care coordination information and services.   Encounter Outcome:  No Answer  Julian Hy, [place] Direct Dial: [phone]

## 2022-03-30 NOTE — Progress Notes (Signed)
While on phone visit with patient's husband, Anne [name], he notes significant strain trying to care for [facility] related to worsening dementia. Recounts the recent ED visits, suggestions of [facility] and [facility]. Does not sound like he has taken her to [facility] as recommended by PCP at last visit, unclear if the suggestion regarding Home Health was related to the patient. Notes there is a nurse in their home right now but he doesn't know where she's from.   Referral placed for LCSW for support in evaluating level of care options. Noted in referral that Anne [name] is hard of hearing.   Catie Hedwig Morton, PharmD, [place], [facility] [phone]

## 2022-04-03 ENCOUNTER — Other Ambulatory Visit: Payer: Self-pay

## 2022-04-03 ENCOUNTER — Other Ambulatory Visit: Payer: Self-pay | Admitting: *Deleted

## 2022-04-03 DIAGNOSIS — C3411 Malignant neoplasm of upper lobe, right bronchus or lung: Secondary | ICD-10-CM

## 2022-04-03 DIAGNOSIS — E063 Autoimmune thyroiditis: Secondary | ICD-10-CM

## 2022-04-03 NOTE — Progress Notes (Signed)
  Care Coordination  Outreach Note  04/03/2022 Name: Anne [name] MRN: [id number] DOB: 12-03-1950   Care Coordination Outreach Attempts: A second unsuccessful outreach was attempted today to offer the patient with information about available care coordination services as a benefit of their health plan.     Follow Up Plan:  Additional outreach attempts will be made to offer the patient care coordination information and services.   Referral received   Encounter Outcome:  No Answer  Julian Hy, [place] Direct Dial: [phone]

## 2022-04-04 ENCOUNTER — Encounter: Payer: Self-pay | Admitting: Oncology

## 2022-04-04 ENCOUNTER — Inpatient Hospital Stay: Payer: PPO

## 2022-04-04 ENCOUNTER — Inpatient Hospital Stay (HOSPITAL_BASED_OUTPATIENT_CLINIC_OR_DEPARTMENT_OTHER): Payer: PPO | Admitting: Oncology

## 2022-04-04 VITALS — BP 137/93 | HR 83 | Temp 96.8°F | Resp 18 | Ht 64.0 in | Wt 158.6 lb

## 2022-04-04 DIAGNOSIS — C3411 Malignant neoplasm of upper lobe, right bronchus or lung: Secondary | ICD-10-CM | POA: Diagnosis not present

## 2022-04-04 DIAGNOSIS — Z5112 Encounter for antineoplastic immunotherapy: Secondary | ICD-10-CM | POA: Diagnosis not present

## 2022-04-04 DIAGNOSIS — E063 Autoimmune thyroiditis: Secondary | ICD-10-CM

## 2022-04-04 LAB — COMPREHENSIVE METABOLIC PANEL
ALT: 28 U/L (ref 0–44)
AST: 37 U/L (ref 15–41)
Albumin: 3.8 g/dL (ref 3.5–5.0)
Alkaline Phosphatase: 80 U/L (ref 38–126)
Anion gap: 11 (ref 5–15)
BUN: 14 mg/dL (ref 8–23)
CO2: 28 mmol/L (ref 22–32)
Calcium: 9.2 mg/dL (ref 8.9–10.3)
Chloride: 103 mmol/L (ref 98–111)
Creatinine, Ser: 1 mg/dL (ref 0.44–1.00)
GFR, Estimated: 59 mL/min — ABNORMAL LOW (ref >60)
Glucose, Bld: 113 mg/dL — ABNORMAL HIGH (ref 70–99)
Potassium: 3.8 mmol/L (ref 3.5–5.1)
Sodium: 142 mmol/L (ref 135–145)
Total Bilirubin: 0.5 mg/dL (ref 0.3–1.2)
Total Protein: 7.4 g/dL (ref 6.5–8.1)

## 2022-04-04 LAB — CBC WITH DIFFERENTIAL/PLATELET
Abs Immature Granulocytes: 0.03 10*3/uL (ref 0.00–0.07)
Basophils Absolute: 0.1 10*3/uL (ref 0.0–0.1)
Basophils Relative: 1 %
Eosinophils Absolute: 0.1 10*3/uL (ref 0.0–0.5)
Eosinophils Relative: 3 %
HCT: 44.3 % (ref 36.0–46.0)
Hemoglobin: 13.6 g/dL (ref 12.0–15.0)
Immature Granulocytes: 1 %
Lymphocytes Relative: 19 %
Lymphs Abs: 0.9 10*3/uL (ref 0.7–4.0)
MCH: 27.6 pg (ref 26.0–34.0)
MCHC: 30.7 g/dL (ref 30.0–36.0)
MCV: 90 fL (ref 80.0–100.0)
Monocytes Absolute: 0.4 10*3/uL (ref 0.1–1.0)
Monocytes Relative: 9 %
Neutro Abs: 3.2 10*3/uL (ref 1.7–7.7)
Neutrophils Relative %: 67 %
Platelets: 166 10*3/uL (ref 150–400)
RBC: 4.92 MIL/uL (ref 3.87–5.11)
RDW: 17.7 % — ABNORMAL HIGH (ref 11.5–15.5)
WBC: 4.7 10*3/uL (ref 4.0–10.5)
nRBC: 0 % (ref 0.0–0.2)

## 2022-04-04 LAB — TSH: TSH: 0.459 u[IU]/mL (ref 0.350–4.500)

## 2022-04-04 MED ORDER — SODIUM CHLORIDE 0.9 % IV SOLN
1200.0000 mg | Freq: Once | INTRAVENOUS | Status: AC
  Administered 2022-04-04: 1200 mg via INTRAVENOUS
  Filled 2022-04-04: qty 20

## 2022-04-04 MED ORDER — HEPARIN SOD (PORK) LOCK FLUSH 100 UNIT/ML IV SOLN
500.0000 [IU] | Freq: Once | INTRAVENOUS | Status: AC | PRN
  Administered 2022-04-04: 500 [IU]
  Filled 2022-04-04: qty 5

## 2022-04-04 MED ORDER — SODIUM CHLORIDE 0.9 % IV SOLN
Freq: Once | INTRAVENOUS | Status: AC
  Filled 2022-04-04: qty 250

## 2022-04-04 MED ORDER — SODIUM CHLORIDE 0.9% FLUSH
10.0000 mL | INTRAVENOUS | Status: DC | PRN
  Filled 2022-04-04: qty 10

## 2022-04-04 NOTE — Patient Instructions (Signed)
[facility]  Discharge Instructions: Thank you for choosing [facility] to provide your oncology and hematology care.  If you have a lab appointment with the [facility], please go directly to the [facility] and check in at the registration area.  Wear comfortable clothing and clothing appropriate for easy access to any Portacath or PICC line.   We strive to give you quality time with your provider. You may need to reschedule your appointment if you arrive late (15 or more minutes).  Arriving late affects you and other patients whose appointments are after yours.  Also, if you miss three or more appointments without notifying the office, you may be dismissed from the clinic at the provider's discretion.      For prescription refill requests, have your pharmacy contact our office and allow 72 hours for refills to be completed.    Today you received the following chemotherapy and/or immunotherapy agents- tecentriq      To help prevent nausea and vomiting after your treatment, we encourage you to take your nausea medication as directed.  BELOW ARE SYMPTOMS THAT SHOULD BE REPORTED IMMEDIATELY: *FEVER GREATER THAN 100.4 F (38 C) OR HIGHER *CHILLS OR SWEATING *NAUSEA AND VOMITING THAT IS NOT CONTROLLED WITH YOUR NAUSEA MEDICATION *UNUSUAL SHORTNESS OF BREATH *UNUSUAL BRUISING OR BLEEDING *URINARY PROBLEMS (pain or burning when urinating, or frequent urination) *BOWEL PROBLEMS (unusual diarrhea, constipation, pain near the anus) TENDERNESS IN MOUTH AND THROAT WITH OR WITHOUT PRESENCE OF ULCERS (sore throat, sores in mouth, or a toothache) UNUSUAL RASH, SWELLING OR PAIN  UNUSUAL VAGINAL DISCHARGE OR ITCHING   Items with * indicate a potential emergency and should be followed up as soon as possible or go to the Emergency Department if any problems should occur.  Please show the CHEMOTHERAPY ALERT CARD or IMMUNOTHERAPY ALERT CARD at check-in to  the Emergency Department and triage nurse.  Should you have questions after your visit or need to cancel or reschedule your appointment, please contact [facility]  [phone] and follow the prompts.  Office hours are 8:00 a.m. to 4:30 p.m. Monday - Friday. Please note that voicemails left after 4:00 p.m. may not be returned until the following business day.  We are closed weekends and major holidays. You have access to a nurse at all times for urgent questions. Please call the main number to the clinic [phone] and follow the prompts.  For any non-urgent questions, you may also contact your provider using MyChart. We now offer e-Visits for anyone 34 and older to request care online for non-urgent symptoms. For details visit [email or web address].   Also download the MyChart app! Go to the app store, search "MyChart", open the app, select [facility], and log in with your MyChart username and password.

## 2022-04-04 NOTE — Progress Notes (Signed)
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  She has been referred to [place] for findings of bone metastases and her recent MRI.  She has a prior history of 3 packs/day day smoking for over 45 years and quit smoking 5 years ago.She had a CT chest abdomen pelvis in 2018 which showed a 5 mm lung nodule in the left lower lobe.  Recently over the last 2 months patient has been having worsening back pain and was referred to orthopedics.  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  Pathologic fracture of S1.   PET scan showed 2 RUL lung nodules, hilar and mediastinal adenopathy and widespread bone mets. MRI brain negative.   Patient completed palliative RT to her spine. Bronchoscopy showed small cell lung cancer.  Palliative carboplatin, etoposide and Tecentriq started on 08/18/2018. Scans after 4 cycles showed stable disease. 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  She reports being compliant but her values  fluctuate widely.    Interval history-she remains confused.  She can tell me her name but does not remember where she is.  She does not drive thankfully and was brought here by her significant other.  Denies any specific complaints at this time.  ECOG PS- 1 Pain scale- 0   Review of systems- Review of Systems  Constitutional:  Negative for chills, fever, malaise/fatigue and weight loss.  HENT:  Negative for congestion, ear discharge and nosebleeds.   Eyes:  Negative for blurred vision.  Respiratory:  Negative for cough, hemoptysis, sputum production, shortness of breath and wheezing.   Cardiovascular:  Negative for chest pain, palpitations, orthopnea and claudication.  Gastrointestinal:  Negative for abdominal pain, blood in stool, constipation, diarrhea, heartburn, melena, nausea and vomiting.  Genitourinary:  Negative for dysuria, flank pain, frequency, hematuria and urgency.  Musculoskeletal:  Negative for back pain, joint pain and myalgias.  Skin:  Negative for rash.  Neurological:  Negative for dizziness, tingling, focal weakness, seizures, weakness and headaches.  Endo/Heme/Allergies:  Does not bruise/bleed easily.  Psychiatric/Behavioral:  Negative for depression and suicidal ideas. The patient does not have insomnia.       Allergies  Allergen Reactions   Bee Venom Swelling     Past Medical History:  Diagnosis Date   Arthritis    Cirrhosis ([place])    Dementia ([place])    Depression    GERD (gastroesophageal reflux disease)    Hyperlipidemia    Hypertension    Lung cancer ([place])    Metastatic bone cancer      Past Surgical History:  Procedure Laterality Date   APPENDECTOMY  [place]   COLONOSCOPY WITH PROPOFOL N/A 11/15/2016   Procedure: COLONOSCOPY WITH PROPOFOL;  Surgeon: Jonathon Bellows, MD;  Location: [facility] ENDOSCOPY;  Service: Gastroenterology;  Laterality: N/A;   ENDOBRONCHIAL ULTRASOUND Right 07/30/2018   Procedure: ENDOBRONCHIAL ULTRASOUND;   Surgeon: Laverle Hobby, MD;  Location: [facility] ORS;  Service: Pulmonary;  Laterality: Right;   ESOPHAGOGASTRODUODENOSCOPY (EGD) WITH PROPOFOL N/A 01/07/2017   Procedure: ESOPHAGOGASTRODUODENOSCOPY (EGD) WITH PROPOFOL;  Surgeon: Jonathon Bellows, MD;  Location: [facility] ENDOSCOPY;  Service: Gastroenterology;  Laterality: N/A;   LAPAROSCOPY N/A 03/01/2017   Procedure: LAPAROSCOPY DIAGNOSTIC;  Surgeon: Robert Bellow, MD;  Location: [facility] ORS;  Service: General;  Laterality: N/A;   PORTA CATH INSERTION N/A 08/14/2018   Procedure: PORTA CATH INSERTION;  Surgeon: Algernon Huxley, MD;  Location: [facility] LAB;  Service: Cardiovascular;  Laterality: N/A;   TONSILECTOMY, ADENOIDECTOMY, BILATERAL MYRINGOTOMY AND TUBES  1955   TONSILLECTOMY     VENTRAL HERNIA REPAIR N/A 03/01/2017   10 x 14 CM Ventralight ST mesh, intraperitoneal location.    VENTRAL HERNIA REPAIR N/A 03/01/2017   Procedure: HERNIA REPAIR VENTRAL ADULT;  Surgeon: Robert Bellow, MD;  Location: [facility] ORS;  Service: General;  Laterality: N/A;    Social History   Socioeconomic History   Marital status: Married    Spouse name: Johnny   Number of children: Not on file   Years of education: Not on file   Highest education level: Not on file  Occupational History   Not on file  Tobacco Use   Smoking status: Former    Packs/day: 2.00    Years: 50.00    Total pack years: 100.00    Types: Cigarettes, E-cigarettes    Quit date: 02/07/2012    Years since quitting: 10.1   Smokeless tobacco: Never  Vaping Use   Vaping Use: Former   Start date: 10/06/2013   Devices: uses no liquid  Substance and Sexual Activity   Alcohol use: No    Alcohol/week: 0.0 standard drinks of alcohol   Drug use: No   Sexual activity: Yes  Other Topics Concern   Not on file  Social History Narrative   Married   Retired   Clinical cytogeneticist level of education    No children    1 cup of coffee   Social Determinants of Health   Financial Resource  Strain: Low Risk  (09/08/2020)   Overall Financial Resource Strain (CARDIA)    Difficulty of Paying Living Expenses: Not hard at all  Food Insecurity: No Food Insecurity (09/08/2020)   Hunger Vital Sign    Worried About Running Out of Food in the Last Year: Never true    [place] in the Last Year: Never true  Transportation Needs: No Transportation Needs (09/08/2020)   PRAPARE - Hydrologist (Medical): No    Lack of Transportation (Non-Medical): No  Physical Activity: Unknown (10/29/2018)   Exercise Vital Sign    Days of Exercise per Week: Patient refused    Minutes of Exercise per Session: Patient refused  Stress: No Stress Concern Present (09/08/2020)   [place]    Feeling of Stress : Not at all  Social Connections: Unknown (09/08/2020)   Social Connection and Isolation Panel [NHANES]    Frequency of Communication with Friends and Family: More than three times a week    Frequency of Social Gatherings with Friends and Family:  Not on file    Attends Religious Services: Not on file    Active Member of Clubs or Organizations: Not on file    Attends Club or Organization Meetings: Not on file    Marital Status: Married  Intimate Partner Violence: Not At Risk (09/08/2020)   Humiliation, Afraid, Rape, and Kick questionnaire    Fear of Current or Ex-Partner: No    Emotionally Abused: No    Physically Abused: No    Sexually Abused: No    Family History  Problem Relation Age of Onset   Hypertension Mother    Ovarian cancer Mother 31   Dementia Mother    Heart disease Father    Stroke Father    Seizures Father    Ovarian cancer Sister        ? dx cancer had hyst.   Breast cancer Neg Hx      Current Outpatient Medications:    amLODipine (NORVASC) 5 MG tablet, Take 1 tablet (5 mg total) by mouth daily., Disp: 90 tablet, Rfl: 3   buPROPion (WELLBUTRIN XL) 300 MG 24 hr tablet, Take 1 tablet  by mouth once daily, Disp: 90 tablet, Rfl: 3   divalproex (DEPAKOTE) 250 MG DR tablet, Take 1 tablet (250 mg total) by mouth 2 (two) times daily., Disp: 60 tablet, Rfl: 11   ibuprofen (ADVIL,MOTRIN) 200 MG tablet, Take 400 mg by mouth every 8 (eight) hours as needed for headache or moderate pain., Disp: , Rfl:    levothyroxine (SYNTHROID) 125 MCG tablet, Take 1 tablet (125 mcg total) by mouth daily before breakfast. Take the pill first thing in the morning. Do not take other pills at the same time., Disp: 30 tablet, Rfl: 3   memantine (NAMENDA) 10 MG tablet, Take 1 tablet (10 mg total) by mouth 2 (two) times daily., Disp: 60 tablet, Rfl: 11   Multiple Vitamin (MULTIVITAMIN) tablet, Take 1 tablet by mouth daily., Disp: , Rfl:    Omega-3 Fatty Acids (FISH OIL) 300 MG CAPS, Take 1 capsule by mouth daily., Disp: , Rfl:    ondansetron (ZOFRAN) 4 MG tablet, Take 1 tablet (4 mg total) by mouth every 6 (six) hours., Disp: 12 tablet, Rfl: 0   pantoprazole (PROTONIX) 40 MG tablet, TAKE 1 TABLET BY MOUTH TWICE DAILY BEFORE A MEAL, Disp: 180 tablet, Rfl: 0   potassium chloride SA (KLOR-CON M) 20 MEQ tablet, Take 1 tablet by mouth once daily, Disp: 14 tablet, Rfl: 0   QUEtiapine (SEROQUEL) 25 MG tablet, TAKE 1/2 (ONE-HALF) TABLET BY MOUTH AT BEDTIME, Disp: 15 tablet, Rfl: 0   rivastigmine (EXELON) 3 MG capsule, Take 1 capsule (3 mg total) by mouth 2 (two) times daily., Disp: 60 capsule, Rfl: 11   rosuvastatin (CRESTOR) 20 MG tablet, Take 1 tablet by mouth once daily, Disp: 90 tablet, Rfl: 0   sertraline (ZOLOFT) 100 MG tablet, Take 2 tablets (200 mg total) by mouth daily., Disp: 180 tablet, Rfl: 1 No current facility-administered medications for this visit.  Facility-Administered Medications Ordered in Other Visits:    denosumab (XGEVA) injection 120 mg, 120 mg, Subcutaneous, Q30 days, Sindy Guadeloupe, MD, 120 mg at 03/05/19 1525   heparin lock flush 100 UNIT/ML injection, , , ,    sodium chloride flush (NS)  0.9 % injection 10 mL, 10 mL, Intracatheter, PRN, Sindy Guadeloupe, MD  Physical exam:  Vitals:   04/04/22 1324  BP: (!) 137/93  Pulse: 83  Resp: 18  Temp: (!) 96.8 F (36  C)  TempSrc: Tympanic  SpO2: 97%  Weight: 158 lb 9.6 oz (71.9 kg)  Height: '5\' 4"'$  (1.626 m)   Physical Exam Cardiovascular:     Rate and Rhythm: Normal rate and regular rhythm.     Heart sounds: Normal heart sounds.  Pulmonary:     Effort: Pulmonary effort is normal.     Breath sounds: Normal breath sounds.  Abdominal:     General: Bowel sounds are normal.     Palpations: Abdomen is soft.  Skin:    General: Skin is warm and dry.  Neurological:     Mental Status: She is alert.     Comments: Oriented to self only         Latest Ref Rng & Units 04/04/2022    1:05 PM  CMP  Glucose 70 - 99 mg/dL 113   BUN 8 - 23 mg/dL 14   Creatinine 0.44 - 1.00 mg/dL 1.00   Sodium 135 - 145 mmol/L 142   Potassium 3.5 - 5.1 mmol/L 3.8   Chloride 98 - 111 mmol/L 103   CO2 22 - 32 mmol/L 28   Calcium 8.9 - 10.3 mg/dL 9.2   Total Protein 6.5 - 8.1 g/dL 7.4   Total Bilirubin 0.3 - 1.2 mg/dL 0.5   Alkaline Phos 38 - 126 U/L 80   AST 15 - 41 U/L 37   ALT 0 - 44 U/L 28       Latest Ref Rng & Units 04/04/2022    1:05 PM  CBC  WBC 4.0 - 10.5 K/uL 4.7   Hemoglobin 12.0 - 15.0 g/dL 13.6   Hematocrit 36.0 - 46.0 % 44.3   Platelets 150 - 400 K/uL 166      Assessment and plan- Patient is a 72 y.o. 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  She is here for on treatment assessment prior to cycle 55 of palliative Tecentriq  Counts okay to proceed with Tecentriq today and I will see her back in 3 weeks for her next treatment.  I plan to repeat scans sometime in April 2024 current scan shows stable disease I will get her off Tecentriq and monitor her lung cancer of systemic therapy.  Autoimmune hypothyroidism: Patient is currently on iron 25 mcg of levothyroxine and for the first time in the last  couple of years her TSH is normal.   Visit Diagnosis 1. Small cell lung cancer, right upper lobe ([place])   2. Encounter for antineoplastic immunotherapy   3. Autoimmune hypothyroidism      Dr. Randa Evens, MD, MPH [facility] [id number] 04/04/2022 3:35 PM

## 2022-04-04 NOTE — Progress Notes (Signed)
No concerns for the provider.

## 2022-04-05 ENCOUNTER — Inpatient Hospital Stay (HOSPITAL_BASED_OUTPATIENT_CLINIC_OR_DEPARTMENT_OTHER): Payer: PPO | Admitting: Hospice and Palliative Medicine

## 2022-04-05 ENCOUNTER — Other Ambulatory Visit: Payer: Self-pay

## 2022-04-05 ENCOUNTER — Emergency Department: Payer: PPO

## 2022-04-05 ENCOUNTER — Emergency Department
Admission: EM | Admit: 2022-04-05 | Discharge: 2022-04-06 | Disposition: A | Discharge status: Home / Self Care | Payer: PPO | Attending: Emergency Medicine | Admitting: Emergency Medicine

## 2022-04-05 DIAGNOSIS — I10 Essential (primary) hypertension: Secondary | ICD-10-CM | POA: Insufficient documentation

## 2022-04-05 DIAGNOSIS — Z85118 Personal history of other malignant neoplasm of bronchus and lung: Secondary | ICD-10-CM | POA: Diagnosis not present

## 2022-04-05 DIAGNOSIS — Z87891 Personal history of nicotine dependence: Secondary | ICD-10-CM | POA: Insufficient documentation

## 2022-04-05 DIAGNOSIS — F32A Depression, unspecified: Secondary | ICD-10-CM | POA: Diagnosis present

## 2022-04-05 DIAGNOSIS — R4182 Altered mental status, unspecified: Secondary | ICD-10-CM | POA: Diagnosis not present

## 2022-04-05 DIAGNOSIS — Z515 Encounter for palliative care: Secondary | ICD-10-CM | POA: Diagnosis not present

## 2022-04-05 DIAGNOSIS — Z7189 Other specified counseling: Secondary | ICD-10-CM

## 2022-04-05 DIAGNOSIS — R296 Repeated falls: Secondary | ICD-10-CM

## 2022-04-05 DIAGNOSIS — M25512 Pain in left shoulder: Secondary | ICD-10-CM | POA: Diagnosis present

## 2022-04-05 DIAGNOSIS — R410 Disorientation, unspecified: Secondary | ICD-10-CM | POA: Diagnosis present

## 2022-04-05 DIAGNOSIS — C7951 Secondary malignant neoplasm of bone: Secondary | ICD-10-CM | POA: Diagnosis present

## 2022-04-05 DIAGNOSIS — C3411 Malignant neoplasm of upper lobe, right bronchus or lung: Secondary | ICD-10-CM

## 2022-04-05 DIAGNOSIS — R569 Unspecified convulsions: Secondary | ICD-10-CM

## 2022-04-05 DIAGNOSIS — F03918 Unspecified dementia, unspecified severity, with other behavioral disturbance: Secondary | ICD-10-CM | POA: Diagnosis not present

## 2022-04-05 DIAGNOSIS — F01B4 Vascular dementia, moderate, with anxiety: Secondary | ICD-10-CM | POA: Diagnosis not present

## 2022-04-05 DIAGNOSIS — R258 Other abnormal involuntary movements: Secondary | ICD-10-CM | POA: Insufficient documentation

## 2022-04-05 DIAGNOSIS — C3490 Malignant neoplasm of unspecified part of unspecified bronchus or lung: Secondary | ICD-10-CM | POA: Diagnosis not present

## 2022-04-05 DIAGNOSIS — F0390 Unspecified dementia without behavioral disturbance: Secondary | ICD-10-CM | POA: Diagnosis present

## 2022-04-05 DIAGNOSIS — D649 Anemia, unspecified: Secondary | ICD-10-CM | POA: Diagnosis present

## 2022-04-05 DIAGNOSIS — R3915 Urgency of urination: Secondary | ICD-10-CM | POA: Diagnosis present

## 2022-04-05 LAB — COMPREHENSIVE METABOLIC PANEL
ALT: 33 U/L (ref 0–44)
AST: 42 U/L — ABNORMAL HIGH (ref 15–41)
Albumin: 3.9 g/dL (ref 3.5–5.0)
Alkaline Phosphatase: 81 U/L (ref 38–126)
Anion gap: 10 (ref 5–15)
BUN: 16 mg/dL (ref 8–23)
CO2: 29 mmol/L (ref 22–32)
Calcium: 9.1 mg/dL (ref 8.9–10.3)
Chloride: 101 mmol/L (ref 98–111)
Creatinine, Ser: 0.93 mg/dL (ref 0.44–1.00)
GFR, Estimated: >60 mL/min (ref >60)
Glucose, Bld: 86 mg/dL (ref 70–99)
Potassium: 3.7 mmol/L (ref 3.5–5.1)
Sodium: 140 mmol/L (ref 135–145)
Total Bilirubin: 0.3 mg/dL (ref 0.3–1.2)
Total Protein: 7.4 g/dL (ref 6.5–8.1)

## 2022-04-05 LAB — ACETAMINOPHEN LEVEL: Acetaminophen (Tylenol), Serum: <10 ug/mL — ABNORMAL LOW (ref 10–30)

## 2022-04-05 LAB — CBC
HCT: 43.6 % (ref 36.0–46.0)
Hemoglobin: 13.3 g/dL (ref 12.0–15.0)
MCH: 27.5 pg (ref 26.0–34.0)
MCHC: 30.5 g/dL (ref 30.0–36.0)
MCV: 90.1 fL (ref 80.0–100.0)
Platelets: 168 10*3/uL (ref 150–400)
RBC: 4.84 MIL/uL (ref 3.87–5.11)
RDW: 17.8 % — ABNORMAL HIGH (ref 11.5–15.5)
WBC: 5.5 10*3/uL (ref 4.0–10.5)
nRBC: 0 % (ref 0.0–0.2)

## 2022-04-05 LAB — ETHANOL: Alcohol, Ethyl (B): <10 mg/dL (ref <10)

## 2022-04-05 LAB — VALPROIC ACID LEVEL: Valproic Acid Lvl: 28 ug/mL — ABNORMAL LOW (ref 50.0–100.0)

## 2022-04-05 LAB — SALICYLATE LEVEL: Salicylate Lvl: <7 mg/dL — ABNORMAL LOW (ref 7.0–30.0)

## 2022-04-05 MED ORDER — MEMANTINE HCL 5 MG PO TABS
10.0000 mg | ORAL_TABLET | Freq: Two times a day (BID) | ORAL | Status: DC
  Administered 2022-04-05 – 2022-04-06 (×2): 10 mg via ORAL
  Filled 2022-04-05 (×2): qty 2

## 2022-04-05 MED ORDER — DIVALPROEX SODIUM 250 MG PO DR TAB
250.0000 mg | DELAYED_RELEASE_TABLET | Freq: Two times a day (BID) | ORAL | Status: DC
  Administered 2022-04-06: 250 mg via ORAL
  Filled 2022-04-05: qty 1

## 2022-04-05 MED ORDER — RIVASTIGMINE TARTRATE 3 MG PO CAPS
3.0000 mg | ORAL_CAPSULE | Freq: Two times a day (BID) | ORAL | Status: DC
  Administered 2022-04-06: 3 mg via ORAL
  Filled 2022-04-05: qty 1

## 2022-04-05 MED ORDER — SERTRALINE HCL 50 MG PO TABS
200.0000 mg | ORAL_TABLET | Freq: Every day | ORAL | Status: DC
  Administered 2022-04-05 – 2022-04-06 (×2): 200 mg via ORAL
  Filled 2022-04-05 (×2): qty 4

## 2022-04-05 MED ORDER — AMLODIPINE BESYLATE 5 MG PO TABS
5.0000 mg | ORAL_TABLET | Freq: Every day | ORAL | Status: DC
  Administered 2022-04-06: 5 mg via ORAL
  Filled 2022-04-05: qty 1

## 2022-04-05 MED ORDER — LEVOTHYROXINE SODIUM 50 MCG PO TABS
125.0000 ug | ORAL_TABLET | Freq: Every day | ORAL | Status: DC
  Administered 2022-04-06: 125 ug via ORAL
  Filled 2022-04-05: qty 3

## 2022-04-05 MED ORDER — LEVOTHYROXINE SODIUM 50 MCG PO TABS: 125.0000 ug | ORAL_TABLET | Freq: Every day | ORAL | Status: DC

## 2022-04-05 MED ORDER — QUETIAPINE FUMARATE 25 MG PO TABS
12.5000 mg | ORAL_TABLET | Freq: Every day | ORAL | Status: DC
  Administered 2022-04-05: 12.5 mg via ORAL
  Filled 2022-04-05: qty 1

## 2022-04-05 MED ORDER — PANTOPRAZOLE SODIUM 40 MG PO TBEC
40.0000 mg | DELAYED_RELEASE_TABLET | Freq: Two times a day (BID) | ORAL | Status: DC
  Administered 2022-04-06: 40 mg via ORAL
  Filled 2022-04-05: qty 1

## 2022-04-05 MED ORDER — ALUM & MAG HYDROXIDE-SIMETH 200-200-20 MG/5ML PO SUSP: 30.0000 mL | Freq: Four times a day (QID) | ORAL | Status: DC | PRN

## 2022-04-05 MED ORDER — ROSUVASTATIN CALCIUM 20 MG PO TABS
20.0000 mg | ORAL_TABLET | Freq: Every day | ORAL | Status: DC
  Administered 2022-04-06: 20 mg via ORAL
  Filled 2022-04-05: qty 1

## 2022-04-05 MED ORDER — BUPROPION HCL ER (XL) 150 MG PO TB24
300.0000 mg | ORAL_TABLET | Freq: Every day | ORAL | Status: DC
  Administered 2022-04-06: 300 mg via ORAL
  Filled 2022-04-05: qty 2

## 2022-04-05 MED ORDER — ONDANSETRON HCL 4 MG PO TABS
4.0000 mg | ORAL_TABLET | Freq: Four times a day (QID) | ORAL | Status: DC
  Administered 2022-04-05 – 2022-04-06 (×2): 4 mg via ORAL
  Filled 2022-04-05 (×2): qty 1

## 2022-04-05 NOTE — ED Notes (Signed)
Offered patient toileting at this time, declines. Aware of need for urine sample.

## 2022-04-05 NOTE — Progress Notes (Signed)
Patient did not answer call. Will reschedule.

## 2022-04-05 NOTE — BH Assessment (Signed)
Comprehensive Clinical Assessment (CCA) Screening, Triage and Referral Note  04/05/2022 RAFIA RAZZAK [id number] Recommendations for Services/Supports/Treatments: Consulted with Lynder Parents., NP, who recommended pt. be observed overnight and discharged in the morning.  Caysie E. Sayne is a 72 year old, English speaking, Caucasian female with a hx of dementia and depression. Pt presented to [facility] ED due to endorsing passive SI. On assessment, pt. unable to identify why she was in the ED and express that she was ready to go home. Pt denied having any SI plans or intent. Pt had clear/coherent speech. Pt was not oriented and had significant memory impairments. Pt had a pleasant demeanor throughout the assessment. Pt denied having HII, AV/H, anxiety or depression. Pt did not appear to be responding to internal/external stimuli nor did pt. present with delusional thinking. Pt had an appropriate mood and a congruent affect.   Collateral: Daughter Albina Billet [phone] explained that the pt. is confused and is not certain whether or not pt. would act on SI. Daughter explained that she does not believe the pt. would act on it. Pt reported that the pt. and her husband have had a lot of discord due to pt.'s mental status and feels that the husband is extremely frustrated.  Chief Complaint:  Chief Complaint  Patient presents with   Mental Health Problem   Visit Diagnosis: Dementia  Patient Reported Information How did you hear about [place]? Family/Friend  What Is the Reason for Your Visit/Call Today? LEO brought pt in after completing wellness check at daughters request. Per LEO there have many multiple calls out to pt home this week with concerns for wellness and r/t dementia. Per LEO pt making statements of SI.  How Long Has This Been Causing You Problems? 1 wk - 1 month  What Do You Feel Would Help You the Most Today? Stress Management   Have You Recently Had Any Thoughts About Hurting Yourself? Yes  Are  You Planning to Commit Suicide/Harm Yourself At This time? No   Have you Recently Had Thoughts About [place]? No  Are You Planning to Harm Someone at This Time? No  Explanation: No data recorded  Have You Used Any Alcohol or Drugs in the Past 24 Hours? No  How Long Ago Did You Use Drugs or Alcohol? No data recorded What Did You Use and How Much? No data recorded  Do You Currently Have a Therapist/Psychiatrist? No  Name of Therapist/Psychiatrist: No data recorded  Have You Been Recently Discharged From Any Office Practice or Programs? No data recorded Explanation of Discharge From Practice/Program: Pt discharged [facility] on 03/26/22.    CCA Screening Triage Referral Assessment Type of Contact: Face-to-Face  Telemedicine Service Delivery:   Is this Initial or Reassessment?   Date Telepsych consult ordered in CHL:    Time Telepsych consult ordered in CHL:    Location of Assessment: [facility] ED  Provider Location: [facility] ED    Collateral Involvement: Knightley, Gensel (Spouse) [phone]   Does Patient Have a [place]? No data recorded Name and Contact of Legal Guardian: No data recorded If Minor and Not Living with Parent(s), Who has Custody? n/a  Is CPS involved or ever been involved? Never  Is APS involved or ever been involved? Never   Patient Determined To Be At Risk for Harm To Self or Others Based on Review of Patient Reported Information or Presenting Complaint? No  Method: No Plan  Availability of Means: No access or NA  Intent: Vague intent  or NA  Notification Required: No need or identified person  Additional Information for Danger to Others Potential: No data recorded Additional Comments for Danger to Others Potential: n/a  Are There Guns or Other Weapons in Your Home? No  Types of Guns/Weapons: n/a  Are These Weapons Safely Secured?                            No  Who Could Verify You Are Able To Have  These Secured: n/a  Do You Have any Outstanding Charges, Pending Court Dates, Parole/Probation? None reported  Contacted To Inform of Risk of Harm To Self or Others: Other: Comment   Does Patient Present under Involuntary Commitment? No    [place] of Residence: [name]   Patient Currently Receiving the Following Services: Medication Management   Determination of Need: Urgent (48 hours)   Options For Referral: ED Visit   Discharge Disposition:     Marveline Profeta R Leor Whyte, LCAS

## 2022-04-05 NOTE — ED Notes (Addendum)
Patient changed into hospital scrubs at this time with this RN and Myra, EDT as witness. Patient belongings as follows:  1 pair shoes 1 pair socks 1 pair pants 1 shirt 1 jacket

## 2022-04-05 NOTE — BH Assessment (Incomplete)
Comprehensive Clinical Assessment (CCA) Screening, Triage and Referral Note  04/05/2022 Anne [name] [id number]  Chief Complaint:  Chief Complaint  Patient presents with  . Mental Health Problem   Visit Diagnosis: ***  Patient Reported Information How did you hear about [place]? Family/Friend  What Is the Reason for Your Visit/Call Today? LEO brought pt in after completing wellness check at daughters request. Per LEO there have many multiple calls out to pt home this week with concerns for wellness and r/t dementia. Per LEO pt making statements of SI.  How Long Has This Been Causing You Problems? 1 wk - 1 month  What Do You Feel Would Help You the Most Today? Stress Management   Have You Recently Had Any Thoughts About Hurting Yourself? Yes  Are You Planning to Commit Suicide/Harm Yourself At This time? No   Have you Recently Had Thoughts About [place]? No  Are You Planning to Harm Someone at This Time? No  Explanation: No data recorded  Have You Used Any Alcohol or Drugs in the Past 24 Hours? No  How Long Ago Did You Use Drugs or Alcohol? No data recorded What Did You Use and How Much? No data recorded  Do You Currently Have a Therapist/Psychiatrist? No  Name of Therapist/Psychiatrist: No data recorded  Have You Been Recently Discharged From Any Office Practice or Programs? No data recorded Explanation of Discharge From Practice/Program: Pt discharged from [facility] on 03/26/22.    CCA Screening Triage Referral Assessment Type of Contact: Face-to-Face  Telemedicine Service Delivery:   Is this Initial or Reassessment?   Date Telepsych consult ordered in CHL:    Time Telepsych consult ordered in CHL:    Location of Assessment: [facility] ED  Provider Location: [facility] ED    Collateral Involvement: Retia, Bowlen (Spouse) [phone]   Does Patient Have a [place]? No data recorded Name and Contact of Legal Guardian: No data  recorded If Minor and Not Living with Parent(s), Who has Custody? n/a  Is CPS involved or ever been involved? Never  Is APS involved or ever been involved? Never   Patient Determined To Be At Risk for Harm To Self or Others Based on Review of Patient Reported Information or Presenting Complaint? No  Method: No Plan  Availability of Means: No access or NA  Intent: Vague intent or NA  Notification Required: No need or identified person  Additional Information for Danger to Others Potential: No data recorded Additional Comments for Danger to Others Potential: n/a  Are There Guns or Other Weapons in Your Home? No  Types of Guns/Weapons: n/a  Are These Weapons Safely Secured?                            No  Who Could Verify You Are Able To Have These Secured: n/a  Do You Have any Outstanding Charges, Pending Court Dates, Parole/Probation? None reported  Contacted To Inform of Risk of Harm To Self or Others: Other: Comment   Does Patient Present under Involuntary Commitment? No    [place] of Residence: [name]   Patient Currently Receiving the Following Services: Medication Management   Determination of Need: Urgent (48 hours)   Options For Referral: ED Visit   Discharge Disposition:     Othniel Maret R Ralphine Hinks, LCAS

## 2022-04-05 NOTE — ED Triage Notes (Signed)
LEO brought pt in after completing wellness check at daughters request. Per LEO there have many multiple calls out to pt home this week with concerns for wellness and r/t dementia. Per LEO pt making statements of SI. Pt reports fighting with her daughter tonight but is unable to tell RN if she lives alone or with her daughter. Pt is confused in triage and is unable to relay events of evening to RN. Pt does deny chest pain, SOB, dizziness, vision change, parasthesia. Pt cooperative and following commands. Pt ambulatory in triage. Pt noted to be breathing unlabored speaking in full sentences.

## 2022-04-05 NOTE — ED Notes (Signed)
Transported to CT 

## 2022-04-05 NOTE — ED Notes (Signed)
Pt RA at baseline, while resting dropped to 85% RA, improved to 94% with 2L [place]. EDP notified.  Pt has h/o lung CA

## 2022-04-05 NOTE — ED Notes (Signed)
Daughter Albina Billet  [phone] aware patient was being brought to hospital per [facility] Monday with [facility] PD.

## 2022-04-05 NOTE — ED Notes (Signed)
Pt voided at this time, missed hat placed by staff to collect UA. Will try again at next void. Beverage provided to patient.

## 2022-04-05 NOTE — ED Provider Notes (Signed)
[facility] Provider Note    Event Date/Time   First MD Initiated Contact with Patient 04/05/22 2104     (approximate)   History   Mental Health Problem   HPI  Anne [name] is a 72 y.o. female  here with concern for depression/behavioral issue. Pt reportedly has had progressively worsening dementia and general confusion for weeks to months. Pt reportedly brought in today after daughter expressed concern about pt's safety. Pt reportedly made some passive statements to neighbors about not wanting to be alive. On my assessment, pt is calm, cooperative. She says she just got into an argument with her daughter who got angry and called 911. She reports her daughter does not approve of who she lives with (a female significant other) and is just angry. Denies any SI, HI, AVH. She has been taking her meds as prescribed.      Physical Exam   Triage Vital Signs: ED Triage Vitals  Enc Vitals Group     BP 04/05/22 2037 136/81     Pulse Rate 04/05/22 2037 81     Resp 04/05/22 2037 18     Temp 04/05/22 2037 98 F (36.7 C)     Temp Source 04/05/22 2037 Oral     SpO2 04/05/22 2037 94 %     Weight 04/05/22 2036 140 lb (63.5 kg)     Height 04/05/22 2036 '5\' 6"'$  (1.676 m)     Head Circumference --      Peak Flow --      Pain Score 04/05/22 2036 0     Pain Loc --      Pain Edu? --      Excl. in [place]? --     Most recent vital signs: Vitals:   04/06/22 0030 04/06/22 0045  BP: 123/77   Pulse:  66  Resp:    Temp:    SpO2:  91%     General: Awake, no distress.  CV:  Good peripheral perfusion.  Resp:  Normal work of breathing.  Abd:  No distention.  Other:  Alert, oriented to person and knows she is at [facility] but not time. Face is symmetric. Speech is normal. Tongue protrusion midline. Strength 5/5 bl UE and LE. Normal sensation to light touch.   ED Results / Procedures / Treatments   Labs (all labs ordered are listed, but only abnormal results are  displayed) Labs Reviewed  COMPREHENSIVE METABOLIC PANEL - Abnormal; Notable for the following components:      Result Value   AST 42 (*)    All other components within normal limits  SALICYLATE LEVEL - Abnormal; Notable for the following components:   Salicylate Lvl Q000111Q (*)    All other components within normal limits  ACETAMINOPHEN LEVEL - Abnormal; Notable for the following components:   Acetaminophen (Tylenol), Serum <10 (*)    All other components within normal limits  CBC - Abnormal; Notable for the following components:   RDW 17.8 (*)    All other components within normal limits  VALPROIC ACID LEVEL - Abnormal; Notable for the following components:   Valproic Acid Lvl 28 (*)    All other components within normal limits  ETHANOL  AMMONIA  URINE DRUG SCREEN, QUALITATIVE ([facility] ONLY)  URINALYSIS, ROUTINE W REFLEX MICROSCOPIC     EKG Normal sinus rhythm, VR 77. PR 166, QRS 86, QTc 436. No acute st elevations or depressions. No ischemia or infarct.   RADIOLOGY CT Head: NAICA  I also independently reviewed and agree with radiologist interpretations.   PROCEDURES:  Critical Care performed: No    MEDICATIONS ORDERED IN ED: Medications  alum & mag hydroxide-simeth (MAALOX/MYLANTA) 200-200-20 MG/5ML suspension 30 mL (has no administration in time range)  amLODipine (NORVASC) tablet 5 mg (has no administration in time range)  buPROPion (WELLBUTRIN XL) 24 hr tablet 300 mg (has no administration in time range)  divalproex (DEPAKOTE) DR tablet 250 mg (has no administration in time range)  memantine (NAMENDA) tablet 10 mg (10 mg Oral Given 04/05/22 2225)  ondansetron (ZOFRAN) tablet 4 mg (4 mg Oral Given 04/05/22 2230)  pantoprazole (PROTONIX) EC tablet 40 mg (has no administration in time range)  QUEtiapine (SEROQUEL) tablet 12.5 mg (12.5 mg Oral Given 04/05/22 2226)  rivastigmine (EXELON) capsule 3 mg (has no administration in time range)  sertraline (ZOLOFT) tablet 200 mg  (200 mg Oral Given 04/05/22 2226)  rosuvastatin (CRESTOR) tablet 20 mg (has no administration in time range)  levothyroxine (SYNTHROID) tablet 125 mcg (has no administration in time range)     IMPRESSION / MDM / ASSESSMENT AND PLAN / ED COURSE  I reviewed the triage vital signs and the nursing notes.                              Differential diagnosis includes, but is not limited to, dementia/delirium with behavioral disturbance, polypharmacy, depression with SI, depression due to general medical condition.  Patient's presentation is most consistent with acute presentation with potential threat to life or bodily function.  72 yo F here for evaluation of possible suicidal ideation/depression/behavioral issues. Pt denies complaints on my assessment. She is confused but per review of records this has been an ongoing issue. Will plan to discuss with daughter/significant other re: behavioral concerns. Pt does have lung cancer, receiving tx. Will check CT head, basic labs, and consult psych/tts.  Labs unremarkable. No leukocytosis. CMP at baseline. CT Head is negative. Plan to f/u psych.    FINAL CLINICAL IMPRESSION(S) / ED DIAGNOSES   Final diagnoses:  Dementia with behavioral disturbance ([place])     Rx / DC Orders   ED Discharge Orders     None        Note:  This document was prepared using Dragon voice recognition software and may include unintentional dictation errors.   Duffy Bruce, MD 04/06/22 [phone]

## 2022-04-06 ENCOUNTER — Emergency Department: Payer: PPO

## 2022-04-06 DIAGNOSIS — F01B4 Vascular dementia, moderate, with anxiety: Secondary | ICD-10-CM

## 2022-04-06 DIAGNOSIS — C3490 Malignant neoplasm of unspecified part of unspecified bronchus or lung: Secondary | ICD-10-CM | POA: Diagnosis not present

## 2022-04-06 LAB — AMMONIA: Ammonia: 28 umol/L (ref 9–35)

## 2022-04-06 NOTE — Consult Note (Signed)
[facility] Face-to-Face Psychiatry Consult   Reason for Consult:Psychiatric Evaluation  Referring Physician: Dr. Bland Span Patient Identification: Anne [name] MRN:  [id number] Principal Diagnosis: <principal problem not specified> Diagnosis:  Active Problems:   Depression   Anemia   Goals of care, counseling/discussion   Malignant neoplasm metastatic to bone [facility])   Left shoulder pain   Dementia (Anne [name])   Frequent falls   Seizure-like activity (HCC)   Urinary urgency   Total Time spent with patient: 1 hour  Subjective:   Anne [name] is a 72 y.o. female patient presented to [facility] ED voluntarily. It was reported that the patient was endorsing passive SI. The patient does have a history of dementia and depression. On assessment, pt. Unable to identify why she was in the ED and express that she was ready to go home. The patient denies having any SI plans or intent. The patient had clear/coherent speech. The patient was not oriented and had significant memory impairments. The patient had a pleasant demeanor throughout the assessment.  This provider saw The patient face-to-face; the chart was reviewed, and Dr. Bland Span was consulted on 04/05/2022 due to the patient's care. It was discussed with the EDP that the patient does not meet the criteria to be admitted to the psychiatric inpatient unit.  On evaluation, the patient is alert and oriented x 2, calm, cooperative, and mood-congruent with affect. The patient does not appear to be responding to internal or external stimuli. The patient is presenting with some delusional thinking. The patient denies auditory or visual hallucinations. The patient denies any suicidal, homicidal, or self-harm ideations. The patient is not presenting with any psychotic or paranoid behaviors.  Collateral: Daughter Albina Billet [phone] explained that the pt. is confused and unsure whether or not pt. I would act on SI. The daughter explained that she does not believe the pt. I  would act on it. Pt reported that pt. and her husband have had a lot of discord due to pt.'s mental status and feels that the husband is highly frustrated.    HPI:    Past Psychiatric History:  Dementia ([place]) Depression  Risk to Self:   Risk to Others:   Prior Inpatient Therapy:   Prior Outpatient Therapy:    Past Medical History:  Past Medical History:  Diagnosis Date   Arthritis    Cirrhosis ([place])    Dementia ([place])    Depression    GERD (gastroesophageal reflux disease)    Hyperlipidemia    Hypertension    Lung cancer ([place])    Metastatic bone cancer     Past Surgical History:  Procedure Laterality Date   APPENDECTOMY  1971   CHOLECYSTECTOMY  1971   COLONOSCOPY WITH PROPOFOL N/A 11/15/2016   Procedure: COLONOSCOPY WITH PROPOFOL;  Surgeon: Jonathon Bellows, MD;  Location: [facility] ENDOSCOPY;  Service: Gastroenterology;  Laterality: N/A;   ENDOBRONCHIAL ULTRASOUND Right 07/30/2018   Procedure: ENDOBRONCHIAL ULTRASOUND;  Surgeon: Laverle Hobby, MD;  Location: [facility] ORS;  Service: Pulmonary;  Laterality: Right;   ESOPHAGOGASTRODUODENOSCOPY (EGD) WITH PROPOFOL N/A 01/07/2017   Procedure: ESOPHAGOGASTRODUODENOSCOPY (EGD) WITH PROPOFOL;  Surgeon: Jonathon Bellows, MD;  Location: [facility] ENDOSCOPY;  Service: Gastroenterology;  Laterality: N/A;   LAPAROSCOPY N/A 03/01/2017   Procedure: LAPAROSCOPY DIAGNOSTIC;  Surgeon: Robert Bellow, MD;  Location: [facility] ORS;  Service: General;  Laterality: N/A;   PORTA CATH INSERTION N/A 08/14/2018   Procedure: PORTA CATH INSERTION;  Surgeon: Algernon Huxley, MD;  Location: [facility];  Service: Cardiovascular;  Laterality: N/A;   TONSILECTOMY, ADENOIDECTOMY, BILATERAL MYRINGOTOMY AND TUBES  1955   TONSILLECTOMY     VENTRAL HERNIA REPAIR N/A 03/01/2017   10 x 14 CM Ventralight ST mesh, intraperitoneal location.    VENTRAL HERNIA REPAIR N/A 03/01/2017   Procedure: HERNIA REPAIR VENTRAL ADULT;  Surgeon: Robert Bellow, MD;  Location: [facility] ORS;   Service: General;  Laterality: N/A;   Family History:  Family History  Problem Relation Age of Onset   Hypertension Mother    Ovarian cancer Mother 16   Dementia Mother    Heart disease Father    Stroke Father    Seizures Father    Ovarian cancer Sister        ? dx cancer had hyst.   Breast cancer Neg Hx    Family Psychiatric  History:  Social History:  Social History   Substance and Sexual Activity  Alcohol Use No   Alcohol/week: 0.0 standard drinks of alcohol     Social History   Substance and Sexual Activity  Drug Use No    Social History   Socioeconomic History   Marital status: Married    Spouse name: Johnny   Number of children: Not on file   Years of education: Not on file   Highest education level: Not on file  Occupational History   Not on file  Tobacco Use   Smoking status: Former    Packs/day: 2.00    Years: 50.00    Total pack years: 100.00    Types: Cigarettes, E-cigarettes    Quit date: 02/07/2012    Years since quitting: 10.1   Smokeless tobacco: Never  Vaping Use   Vaping Use: Former   Start date: 10/06/2013   Devices: uses no liquid  Substance and Sexual Activity   Alcohol use: No    Alcohol/week: 0.0 standard drinks of alcohol   Drug use: No   Sexual activity: Yes  Other Topics Concern   Not on file  Social History Narrative   Married   Retired   Clinical cytogeneticist level of education    No children    1 cup of coffee   Social Determinants of Health   Financial Resource Strain: Low Risk  (09/08/2020)   Overall Financial Resource Strain (CARDIA)    Difficulty of Paying Living Expenses: Not hard at all  Food Insecurity: No Food Insecurity (09/08/2020)   Hunger Vital Sign    Worried About Running Out of Food in the Last Year: Never true    [place] in the Last Year: Never true  Transportation Needs: No Transportation Needs (09/08/2020)   PRAPARE - Hydrologist (Medical): No    Lack of Transportation  (Non-Medical): No  Physical Activity: Unknown (10/29/2018)   Exercise Vital Sign    Days of Exercise per Week: Patient refused    Minutes of Exercise per Session: Patient refused  Stress: No Stress Concern Present (09/08/2020)   [place]    Feeling of Stress : Not at all  Social Connections: Unknown (09/08/2020)   Social Connection and Isolation Panel [NHANES]    Frequency of Communication with Friends and Family: More than three times a week    Frequency of Social Gatherings with Friends and Family: Not on file    Attends Religious Services: Not on file    Active Member of Clubs or Organizations: Not on file    Attends Club or  Organization Meetings: Not on file    Marital Status: Married   Additional Social History:    Allergies:   Allergies  Allergen Reactions   Bee Venom Swelling    Labs:  Results for orders placed or performed during the hospital encounter of 04/05/22 (from the past 48 hour(s))  Comprehensive metabolic panel     Status: Abnormal   Collection Time: 04/05/22  8:36 PM  Result Value Ref Range   Sodium 140 135 - 145 mmol/L   Potassium 3.7 3.5 - 5.1 mmol/L   Chloride 101 98 - 111 mmol/L   CO2 29 22 - 32 mmol/L   Glucose, Bld 86 70 - 99 mg/dL    Comment: Glucose reference range applies only to samples taken after fasting for at least 8 hours.   BUN 16 8 - 23 mg/dL   Creatinine, Ser 0.93 0.44 - 1.00 mg/dL   Calcium 9.1 8.9 - 10.3 mg/dL   Total Protein 7.4 6.5 - 8.1 g/dL   Albumin 3.9 3.5 - 5.0 g/dL   AST 42 (H) 15 - 41 U/L   ALT 33 0 - 44 U/L   Alkaline Phosphatase 81 38 - 126 U/L   Total Bilirubin 0.3 0.3 - 1.2 mg/dL   GFR, Estimated >60 >60 mL/min    Comment: (NOTE) Calculated using the CKD-EPI Creatinine Equation (2021)    Anion gap 10 5 - 15    Comment: Performed at [facility], [address] [place], [place] [address]  Ethanol     Status: None   Collection Time: 04/05/22   8:36 PM  Result Value Ref Range   Alcohol, Ethyl (B) <10 <10 mg/dL    Comment: (NOTE) Lowest detectable limit for serum alcohol is 10 mg/dL.  For medical purposes only. Performed at [facility], [address] [place], [place] [id number]   Salicylate level     Status: Abnormal   Collection Time: 04/05/22  8:36 PM  Result Value Ref Range   Salicylate Lvl Q000111Q (L) 7.0 - 30.0 mg/dL    Comment: Performed at [facility], [address] [place], [place] [address]  Acetaminophen level     Status: Abnormal   Collection Time: 04/05/22  8:36 PM  Result Value Ref Range   Acetaminophen (Tylenol), Serum <10 (L) 10 - 30 ug/mL    Comment: (NOTE) Therapeutic concentrations vary significantly. A range of 10-30 ug/mL  may be an effective concentration for many patients. However, some  are best treated at concentrations outside of this range. Acetaminophen concentrations >150 ug/mL at 4 hours after ingestion  and >50 ug/mL at 12 hours after ingestion are often associated with  toxic reactions.  Performed at [facility], [address] [place], [place] [address]   cbc     Status: Abnormal   Collection Time: 04/05/22  8:36 PM  Result Value Ref Range   WBC 5.5 4.0 - 10.5 K/uL   RBC 4.84 3.87 - 5.11 MIL/uL   Hemoglobin 13.3 12.0 - 15.0 g/dL   HCT 43.6 36.0 - 46.0 %   MCV 90.1 80.0 - 100.0 fL   MCH 27.5 26.0 - 34.0 pg   MCHC 30.5 30.0 - 36.0 g/dL   RDW 17.8 (H) 11.5 - 15.5 %   Platelets 168 150 - 400 K/uL   nRBC 0.0 0.0 - 0.2 %    Comment: Performed at [facility], [place], [place] [address]  Valproic acid level  Status: Abnormal   Collection Time: 04/05/22  8:36 PM  Result Value Ref Range   Valproic Acid Lvl 28 (L) 50.0 - 100.0 ug/mL    Comment: Performed at [facility], [address] [place], [place] [address]    Current Facility-Administered Medications  Medication Dose Route Frequency Provider Last Rate Last  Admin   alum & mag hydroxide-simeth (MAALOX/MYLANTA) 200-200-20 MG/5ML suspension 30 mL  30 mL Oral Q6H PRN Duffy Bruce, MD       amLODipine (NORVASC) tablet 5 mg  5 mg Oral Daily Duffy Bruce, MD       buPROPion (WELLBUTRIN XL) 24 hr tablet 300 mg  300 mg Oral Daily Duffy Bruce, MD       divalproex (DEPAKOTE) DR tablet 250 mg  250 mg Oral BID Duffy Bruce, MD       levothyroxine (SYNTHROID) tablet 125 mcg  125 mcg Oral Q0600 Duffy Bruce, MD       memantine [facility]) tablet 10 mg  10 mg Oral BID Duffy Bruce, MD   10 mg at 04/05/22 2225   ondansetron (ZOFRAN) tablet 4 mg  4 mg Oral Q6H Duffy Bruce, MD   4 mg at 04/05/22 2230   pantoprazole (PROTONIX) EC tablet 40 mg  40 mg Oral BID Shirlee Latch, MD       QUEtiapine (SEROQUEL) tablet 12.5 mg  12.5 mg Oral QHS Duffy Bruce, MD   12.5 mg at 04/05/22 2226   rivastigmine (EXELON) capsule 3 mg  3 mg Oral BID Duffy Bruce, MD       rosuvastatin (CRESTOR) tablet 20 mg  20 mg Oral Daily Duffy Bruce, MD       sertraline (ZOLOFT) tablet 200 mg  200 mg Oral Daily Duffy Bruce, MD   200 mg at 04/05/22 2226   Current Outpatient Medications  Medication Sig Dispense Refill   amLODipine (NORVASC) 5 MG tablet Take 1 tablet (5 mg total) by mouth daily. 90 tablet 3   buPROPion (WELLBUTRIN XL) 300 MG 24 hr tablet Take 1 tablet by mouth once daily 90 tablet 3   divalproex (DEPAKOTE) 250 MG DR tablet Take 1 tablet (250 mg total) by mouth 2 (two) times daily. 60 tablet 11   ibuprofen (ADVIL,MOTRIN) 200 MG tablet Take 400 mg by mouth every 8 (eight) hours as needed for headache or moderate pain.     levothyroxine (SYNTHROID) 125 MCG tablet Take 1 tablet (125 mcg total) by mouth daily before breakfast. Take the pill first thing in the morning. Do not take other pills at the same time. 30 tablet 3   memantine (NAMENDA) 10 MG tablet Take 1 tablet (10 mg total) by mouth 2 (two) times daily. 60 tablet 11   Multiple Vitamin  (MULTIVITAMIN) tablet Take 1 tablet by mouth daily.     Omega-3 Fatty Acids (FISH OIL) 300 MG CAPS Take 1 capsule by mouth daily.     ondansetron (ZOFRAN) 4 MG tablet Take 1 tablet (4 mg total) by mouth every 6 (six) hours. 12 tablet 0   pantoprazole (PROTONIX) 40 MG tablet TAKE 1 TABLET BY MOUTH TWICE DAILY BEFORE A MEAL 180 tablet 0   potassium chloride SA (KLOR-CON M) 20 MEQ tablet Take 1 tablet by mouth once daily 14 tablet 0   QUEtiapine (SEROQUEL) 25 MG tablet TAKE 1/2 (ONE-HALF) TABLET BY MOUTH AT BEDTIME 15 tablet 0   rivastigmine (EXELON) 3 MG capsule Take 1 capsule (3 mg total) by mouth 2 (two) times daily.  60 capsule 11   rosuvastatin (CRESTOR) 20 MG tablet Take 1 tablet by mouth once daily 90 tablet 0   sertraline (ZOLOFT) 100 MG tablet Take 2 tablets (200 mg total) by mouth daily. 180 tablet 1   Facility-Administered Medications Ordered in Other Encounters  Medication Dose Route Frequency Provider Last Rate Last Admin   denosumab (XGEVA) injection 120 mg  120 mg Subcutaneous Q30 days Sindy Guadeloupe, MD   120 mg at 03/05/19 1525   heparin lock flush 100 UNIT/ML injection             Musculoskeletal: Strength & Muscle Tone: within normal limits Gait & Station: normal Patient leans: N/A  Psychiatric Specialty Exam:  Presentation  General Appearance:  Disheveled  Eye Contact: Fleeting  Speech: Blocked  Speech Volume: Normal  Handedness: Right   Mood and Affect  Mood: Euthymic  Affect: Congruent   Thought Process  Thought Processes: Disorganized  Descriptions of Associations:Intact  Orientation:Partial  Thought Content:Illogical  History of Schizophrenia/Schizoaffective disorder:No data recorded Duration of Psychotic Symptoms:No data recorded Hallucinations:Hallucinations: None  Ideas of Reference:None  Suicidal Thoughts:Suicidal Thoughts: No  Homicidal Thoughts:Homicidal Thoughts: No   Sensorium  Memory: Immediate Poor; Recent Poor;  Remote Poor  Judgment: Poor  Insight: Poor   Executive Functions  Concentration: Poor  Attention Span: Poor  Recall: Poor  Fund of Knowledge: Poor  Language: Poor   Psychomotor Activity  Psychomotor Activity: Psychomotor Activity: Normal   Assets  Assets: Housing; Leisure Time; Physical Health; Resilience; Communication Skills; Desire for Improvement; Financial Resources/Insurance   Sleep  Sleep: Sleep: Good Number of Hours of Sleep: 8   Physical Exam: Physical Exam Vitals and nursing note reviewed. Exam conducted with a chaperone present.  HENT:     Head: Normocephalic and atraumatic.     Right Ear: External ear normal.     Left Ear: External ear normal.     Nose: Nose normal.     Mouth/Throat:     Mouth: Mucous membranes are moist.  Cardiovascular:     Rate and Rhythm: Normal rate.     Pulses: Normal pulses.  Pulmonary:     Effort: Pulmonary effort is normal.  Musculoskeletal:        General: Normal range of motion.     Cervical back: Normal range of motion.  Neurological:     Mental Status: She is alert. Mental status is at baseline.  Psychiatric:        Attention and Perception: She is inattentive.        Mood and Affect: Mood is depressed. Affect is blunt.        Speech: Speech is delayed.        Behavior: Behavior normal. Behavior is cooperative.        Thought Content: Thought content is paranoid.        Judgment: Judgment is impulsive and inappropriate.    ROS Blood pressure 131/79, pulse 69, temperature 98 F (36.7 C), temperature source Oral, resp. rate (!) 22, height '5\' 6"'$  (1.676 m), weight 63.5 kg, SpO2 94 %. Body mass index is 22.6 kg/m.  Treatment Plan Summary: Daily contact with patient to assess and evaluate symptoms and progress in treatment  Disposition: No evidence of imminent risk to self or others at present.   Patient does not meet criteria for psychiatric inpatient admission. Supportive therapy provided about  ongoing stressors. Discussed crisis plan, support from social network, calling 911, coming to the Emergency Department, and calling Suicide Hotline.  Caroline Sauger, NP 04/06/2022 12:38 AM

## 2022-04-06 NOTE — ED Provider Notes (Signed)
-----------------------------------------   12:24 AM on 04/06/2022 ----------------------------------------- Patient did mildly desat while sleeping into the upper 80s placed on 2 L nasal cannula.  In reviewing the patient's chart and recent oncology note from Dr. 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  I reviewed the patient's pulse oxes from recent visits she appears to maintain a pulse ox in the low 90s 92 to 93% for the most part.  Patient satting in the mid 90s initially here, suspect the mild desat at night is likely her baseline no other findings to suspect an acute issue.  Will obtain a chest x-ray as a precaution.  Chest x-ray read as negative.   Harvest Dark, MD 04/08/22 2337

## 2022-04-06 NOTE — ED Notes (Signed)
Pt declines toileting, states she would like to return to sleep and does not need to use the commode. Will attempt UA and UDS collect at next void.

## 2022-04-06 NOTE — ED Notes (Signed)
Pt given belongings bag back and assisted changing into street clothes. Husband bringing car to ER entrance.

## 2022-04-06 NOTE — ED Notes (Signed)
Spoke to daughter Levada Dy) via phone, shared that next steps for disposition planning would likely occur during the day and that patient has been pleasant, calm, and cooperative with this RN.

## 2022-04-06 NOTE — ED Provider Notes (Signed)
Emergency Medicine Observation Re-evaluation Note  Anne [name] is a 72 y.o. female, seen on rounds today.  Pt initially presented to the ED for complaints of Mental Health Problem  Currently, the patient is calm, no acute complaints.  Physical Exam  Blood pressure (!) 141/72, pulse 69, temperature 97.7 F (36.5 C), temperature source Oral, resp. rate 18, height '5\' 6"'$  (1.676 m), weight 63.5 kg, SpO2 93 %. Physical Exam General: NAD Lungs: CTAB Psych: not agitated  ED Course / MDM  EKG:    I have reviewed the labs performed to date as well as medications administered while in observation.  Recent changes in the last 24 hours include no acute events overnight.  Chest x-ray obtained this morning which is unremarkable.  Cleared by psychiatry for discharge  Plan  Current plan is for discharge. Patient is not under full IVC at this time.   Carrie Mew, MD 04/06/22 1120

## 2022-04-09 NOTE — Progress Notes (Signed)
  Care Coordination  Outreach Note  04/09/2022 Name: Anne [name] MRN: [id number] DOB: [birth year]   Care Coordination Outreach Attempts: A third unsuccessful outreach was attempted today to offer the patient with information about available care coordination services as a benefit of their health plan.   Follow Up Plan:  No further outreach attempts will be made at this time. We have been unable to contact the patient to offer or enroll patient in care coordination services  Encounter Outcome:  No Answer  Julian Hy, [place] Direct Dial: [phone]

## 2022-04-11 ENCOUNTER — Ambulatory Visit (INDEPENDENT_AMBULATORY_CARE_PROVIDER_SITE_OTHER): Payer: PPO | Admitting: Family Medicine

## 2022-04-11 ENCOUNTER — Encounter: Payer: Self-pay | Admitting: Family Medicine

## 2022-04-11 ENCOUNTER — Encounter: Payer: Self-pay | Admitting: Oncology

## 2022-04-11 VITALS — BP 136/84 | HR 78 | Temp 98.5°F | Ht 66.0 in | Wt 156.4 lb

## 2022-04-11 DIAGNOSIS — F332 Major depressive disorder, recurrent severe without psychotic features: Secondary | ICD-10-CM

## 2022-04-11 DIAGNOSIS — F03A18 Unspecified dementia, mild, with other behavioral disturbance: Secondary | ICD-10-CM
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
## 2022-04-11 NOTE — Assessment & Plan Note (Signed)
Chronic issue.  Continues to to have worsened.  She has passive suicidal thoughts then no active suicidal thoughts.  We will refer to a geriatric psychiatrist to get their input.  She will continue Zoloft 200 mg daily and Wellbutrin 300 mg daily.

## 2022-04-11 NOTE — Patient Instructions (Signed)
Nice to see you. Home health should contact you to schedule a nurse and a social worker to come out to discuss options for assistance. Psychiatry should contact you to set up an appointment. We will reach out to your neurologist to see if they can move your appointment up.

## 2022-04-11 NOTE — Assessment & Plan Note (Signed)
Chronic issue.  Continues to be uncontrolled and worsening.  She will continue Exelon 3 mg twice daily, Seroquel 12.5 mg nightly, Namenda 10 mg twice daily, and Depakote 250 mg twice daily as prescribed by her neurologist.  We are going to try to get her neurologist follow-up moved up.  I am also going to place a referral for home health to have a social worker come out and evaluate to help with possible assistance options.  I did also discuss that the patient may need to consider placement in a memory unit in the future.

## 2022-04-11 NOTE — Progress Notes (Signed)
I called and got the patient scheduled for 05/09/2022 with the neurologist and they asked that I send in the last OV note by fax and I faxed it to their office confirmation given.  I also called and informed the husband of the appointment date and time and that she is also on the cancellation list for any cancelled sooner appointment and he understood.  Anne [name],cma

## 2022-04-12 ENCOUNTER — Telehealth: Payer: Self-pay | Admitting: *Deleted

## 2022-04-12 NOTE — Telephone Encounter (Signed)
     Patient  visit on 04/05/2021  at [facility] was for treatment  Have you been able to follow up with your primary care physician?YES  The patient was able to obtain any needed medicine or equipment.  Are there diet recommendations that you are having difficulty following?  Patient expresses understanding of discharge instructions and education provided has no other needs at this time.    [place] [phone] [address] , [place] [address] Email : [email or web address]

## 2022-04-13 ENCOUNTER — Telehealth: Payer: Self-pay | Admitting: *Deleted

## 2022-04-13 NOTE — Progress Notes (Signed)
  Care Coordination   Note   04/13/2022 Name: Anne [name] MRN: [id number] DOB: 1950-04-18  Anne [name] is a 72 y.o. year old female who sees Caryl Bis, Angela Adam, MD for primary care. I reached out to [place] by phone today to offer care coordination services.  Ms. Baksh was given information about Care Coordination services today including:   The Care Coordination services include support from the care team which includes your Nurse Coordinator, Clinical Social Worker, or Pharmacist.  The Care Coordination team is here to help remove barriers to the health concerns and goals most important to you. Care Coordination services are voluntary, and the patient may decline or stop services at any time by request to their care team member.   Care Coordination Consent Status: Patient agreed to services and verbal consent obtained.   Follow up plan:  scheduled 04/20/2022  Encounter Outcome:  Pt. Scheduled  Julian Hy, [place] Direct Dial: [phone]

## 2022-04-16 ENCOUNTER — Ambulatory Visit: Payer: PPO | Admitting: Family Medicine

## 2022-04-18 ENCOUNTER — Other Ambulatory Visit: Payer: PPO

## 2022-04-18 DIAGNOSIS — Z515 Encounter for palliative care: Secondary | ICD-10-CM

## 2022-04-18 NOTE — Progress Notes (Signed)
PATIENT NAME: VICTORIAN SCHINKEL DOB: 12/08/1950 MRN: [id number]  PRIMARY CARE PROVIDER: Leone Haven, MD  RESPONSIBLE PARTY:  Acct ID - Guarantor Home Phone Work Phone Relationship Acct Type  [id number] [phone]  Self P/F     [address], [place], [place] [address]   Arrived at patient's home for a scheduled 2 pm visit.  No answer at the door.  Called patient's home number and spouse's number and no answer.  Message left for spouse to return call and visit can be rescheduled.   Lorenza Burton, RN

## 2022-04-20 ENCOUNTER — Ambulatory Visit: Payer: Self-pay

## 2022-04-20 NOTE — Patient Instructions (Addendum)
Visit Information  Thank you for taking time to visit with me today. Please don't hesitate to contact me if I can be of assistance to you.   Following are the goals we discussed today:  Call 911 if you feel patient is a threat to herself or others.   Keep follow up appointments with provider Follow fall precautions as discussed and review fall precautions education article attached   Our next appointment is by telephone on 05/03/22 at 2 pm  Please call the care guide team at [phone] if you need to cancel or reschedule your appointment.   If you are experiencing a Mental Health or [place] or need someone to talk to, please call the Suicide and Crisis Lifeline: 988 call 1-800-273-TALK (toll free, 24 hour hotline)  The patient verbalized understanding of instructions, educational materials, and care plan provided today and agreed to receive a mailed copy of patient instructions, educational materials, and care plan.   Quinn Plowman RN,BSN,CCM [place] [phone] direct line  Fall Prevention in the Home, Adult Falls can cause injuries and can happen to people of all ages. There are many things you can do to make your home safer and to help prevent falls. What actions can I take to prevent falls? General information Use good lighting in all rooms. Make sure to: Replace any light bulbs that burn out. Turn on the lights in dark areas and use night-lights. Keep items that you use often in easy-to-reach places. Lower the shelves around your home if needed. Move furniture so that there are clear paths around it. Do not use throw rugs or other things on the floor that can make you trip. If any of your floors are uneven, fix them. Add color or contrast paint or tape to clearly mark and help you see: Grab bars or handrails. First and last steps of staircases. Where the edge of each step is. If you use a ladder or stepladder: Make sure that it is fully  opened. Do not climb a closed ladder. Make sure the sides of the ladder are locked in place. Have someone hold the ladder while you use it. Know where your pets are as you move through your home. What can I do in the bathroom?     Keep the floor dry. Clean up any water on the floor right away. Remove soap buildup in the bathtub or shower. Buildup makes bathtubs and showers slippery. Use non-skid mats or decals on the floor of the bathtub or shower. Attach bath mats securely with double-sided, non-slip rug tape. If you need to sit down in the shower, use a non-slip stool. Install grab bars by the toilet and in the bathtub and shower. Do not use towel bars as grab bars. What can I do in the bedroom? Make sure that you have a light by your bed that is easy to reach. Do not use any sheets or blankets on your bed that hang to the floor. Have a firm chair or bench with side arms that you can use for support when you get dressed. What can I do in the kitchen? Clean up any spills right away. If you need to reach something above you, use a step stool with a grab bar. Keep electrical cords out of the way. Do not use floor polish or wax that makes floors slippery. What can I do with my stairs? Do not leave anything on the stairs. Make sure that you have a light switch  at the top and the bottom of the stairs. Make sure that there are handrails on both sides of the stairs. Fix handrails that are broken or loose. Install non-slip stair treads on all your stairs if they do not have carpet. Avoid having throw rugs at the top or bottom of the stairs. Choose a carpet that does not hide the edge of the steps on the stairs. Make sure that the carpet is firmly attached to the stairs. Fix carpet that is loose or worn. What can I do on the outside of my home? Use bright outdoor lighting. Fix the edges of walkways and driveways and fix any cracks. Clear paths of anything that can make you trip, such as tools  or rocks. Add color or contrast paint or tape to clearly mark and help you see anything that might make you trip as you walk through a door, such as a raised step or threshold. Trim any bushes or trees on paths to your home. Check to see if handrails are loose or broken and that both sides of all steps have handrails. Install guardrails along the edges of any raised decks and porches. Have leaves, snow, or ice cleared regularly. Use sand, salt, or ice melter on paths if you live where there is ice and snow during the winter. Clean up any spills in your garage right away. This includes grease or oil spills. What other actions can I take? Review your medicines with your doctor. Some medicines can cause dizziness or changes in blood pressure, which increase your risk of falling. Wear shoes that: Have a low heel. Do not wear high heels. Have rubber bottoms and are closed at the toe. Feel good on your feet and fit well. Use tools that help you move around if needed. These include: Canes. Walkers. Scooters. Crutches. Ask your doctor what else you can do to help prevent falls. This may include seeing a physical therapist to learn to do exercises to move better and get stronger. Where to find more information Centers for Disease Control and Prevention, [employer]: StoreMirror.com.cy [employer] on Aging: AquariamTheater.co.nz National Institute on Aging: AquariamTheater.co.nz Contact a doctor if: You are afraid of falling at home. You feel weak, drowsy, or dizzy at home. You fall at home. Get help right away if you: Lose consciousness or have trouble moving after a fall. Have a fall that causes a head injury. These symptoms may be an emergency. Get help right away. Call 911. Do not wait to see if the symptoms will go away. Do not drive yourself to the hospital. This information is not intended to replace advice given to you by your health care provider. Make sure you discuss any questions you have with your health care  provider. Document Revised: 09/25/2021 Document Reviewed: 09/25/2021 Elsevier Patient Education  [place].

## 2022-04-20 NOTE — Patient Outreach (Signed)
Care Coordination   Initial Visit Note   04/20/2022 Name: Anne [name] MRN: [id number] DOB: 06-24-1950  Anne [name] is a 72 y.o. year old female who sees Caryl Bis, Angela Adam, MD for primary care. I spoke with  Anne [name]  and spouse Anne [name] by phone today.  What matters to the patients health and wellness today?  Husband states he is unsure if  [facility] home health or Palliative care nurse has contacted him.  He states he tried to get patient into the [facility] program but states he does not live in the service area.  Husband states patients memory and depression has progressively gotten worse.  He states patient will occasionally have crying spells.  Husband states patient had recent visit with primary care provider and he is aware of patients current status regarding her depression and dementia.  He reports having to do a lot of redirecting in conversation.  He reports patient having 2 falls in the last 2-3 months with most recent fall being fairly recent. He states with this fall patient bruised her knee, scarred the bridge of her nose and over her right eye.    He states patient also has cancer and is receiving cancer treatments at least once a month.  Husband states patient has two daughters on that lives in the area and one that lives out of town.  He reports minimal assistance from daughters.  He states relationship between he and patients daughters are somewhat strained.  He states daughter communicate with patient mostly by phone.   Husband states patient has macular degeneration bilaterally with right eye worse than left. Husband states patient is able to do her ADL's but he stays near her so that she doesn't fall. Husband states he is 87 years old and took early retirement to assist patient with her care.  Husband states he tries to make sure patient gets her medications.     Goals Addressed             This Visit's Progress    Management of dementia and depression        Interventions Today    Flowsheet Row Most Recent Value  Chronic Disease   Chronic disease during today's visit Other  [Dementia, Depression:  Evaluation of current treatment plan related to dementia/ depression and patients adherence to plan as established by provider.]  General Interventions   General Interventions Discussed/Reviewed General Interventions Discussed, Doctor Visits  [Informed spouse Palliative care nurse attempted home visit on 04/18/22. Inquired if pt/spouse contacted by [facility] home health for visit. Advised to request care assistance from patients daughters. Encouraged to keep phone voice mail clear for calls]  Doctor Visits Discussed/Reviewed Doctor Visits Discussed  Jolinda Croak recent primary care visit note with spouse. Reviewed scheduled / upcoming provider appointments. Confirmed pt has transportation appointments. Message left with [facility] home health for return call to verify start of care or pt contact.]  Education Interventions   Education Provided Provided Printed Education, Provided Education  [Advised spouse to keep a notebook and pen/ pencil handy to write down information when calls received regarding patients care.]  Provided Verbal Education On Other  [fall safety.  Discussed importance of seeking help if patient appears to be danger to self or others.  Advised to call 911.]  Mental Health Interventions   Mental Health Discussed/Reviewed Mental Health Discussed, Depression, Other  [Completed PHQ2/9 assessment with patient. Discussed dementia care tips/ strategies.]  Pharmacy Interventions   Pharmacy Dicussed/Reviewed Pharmacy Topics  Discussed  [medications reviewed and compliance discussed.]  Safety Interventions   Safety Discussed/Reviewed Fall Risk  [Assessed for falls.   Discussed fall prevention strategies.  Fall prevention education article mailed to patient/ spouse]              SDOH assessments and interventions completed:  Yes  SDOH Interventions  Today    Flowsheet Row Most Recent Value  SDOH Interventions   Food Insecurity Interventions Intervention Not Indicated  Housing Interventions Intervention Not Indicated  Transportation Interventions Intervention Not Indicated        Care Coordination Interventions:  Yes, provided   Follow up plan: Follow up call scheduled for 05/03/22    Encounter Outcome:  Pt. Visit Completed   Quinn Plowman RN,BSN,CCM [place] [phone] direct line

## 2022-04-25 ENCOUNTER — Other Ambulatory Visit: Payer: Self-pay | Admitting: Neurology

## 2022-04-25 ENCOUNTER — Inpatient Hospital Stay: Payer: PPO

## 2022-04-25 ENCOUNTER — Inpatient Hospital Stay: Payer: PPO | Attending: Oncology | Admitting: Oncology

## 2022-04-25 ENCOUNTER — Encounter: Payer: Self-pay | Admitting: Oncology

## 2022-04-25 VITALS — BP 163/82 | HR 79 | Temp 96.5°F | Resp 18 | Wt 157.3 lb

## 2022-04-25 DIAGNOSIS — Z9221 Personal history of antineoplastic chemotherapy: Secondary | ICD-10-CM | POA: Insufficient documentation

## 2022-04-25 DIAGNOSIS — Z5112 Encounter for antineoplastic immunotherapy: Secondary | ICD-10-CM

## 2022-04-25 DIAGNOSIS — Z87891 Personal history of nicotine dependence: Secondary | ICD-10-CM | POA: Insufficient documentation

## 2022-04-25 DIAGNOSIS — Z8041 Family history of malignant neoplasm of ovary: Secondary | ICD-10-CM | POA: Diagnosis not present

## 2022-04-25 DIAGNOSIS — C3411 Malignant neoplasm of upper lobe, right bronchus or lung: Secondary | ICD-10-CM | POA: Diagnosis not present

## 2022-04-25 DIAGNOSIS — C7951 Secondary malignant neoplasm of bone: Secondary | ICD-10-CM | POA: Insufficient documentation

## 2022-04-25 DIAGNOSIS — E063 Autoimmune thyroiditis: Secondary | ICD-10-CM | POA: Diagnosis not present

## 2022-04-25 DIAGNOSIS — Z79620 Long term (current) use of immunosuppressive biologic: Secondary | ICD-10-CM | POA: Diagnosis not present

## 2022-04-25 DIAGNOSIS — F0393 Unspecified dementia, unspecified severity, with mood disturbance: Secondary | ICD-10-CM | POA: Insufficient documentation

## 2022-04-25 DIAGNOSIS — Z79890 Hormone replacement therapy (postmenopausal): Secondary | ICD-10-CM | POA: Insufficient documentation

## 2022-04-25 LAB — COMPREHENSIVE METABOLIC PANEL
ALT: 29 U/L (ref 0–44)
AST: 41 U/L (ref 15–41)
Albumin: 3.8 g/dL (ref 3.5–5.0)
Alkaline Phosphatase: 82 U/L (ref 38–126)
Anion gap: 11 (ref 5–15)
BUN: 13 mg/dL (ref 8–23)
CO2: 27 mmol/L (ref 22–32)
Calcium: 9 mg/dL (ref 8.9–10.3)
Chloride: 103 mmol/L (ref 98–111)
Creatinine, Ser: 0.96 mg/dL (ref 0.44–1.00)
GFR, Estimated: >60 mL/min (ref >60)
Glucose, Bld: 75 mg/dL (ref 70–99)
Potassium: 3.6 mmol/L (ref 3.5–5.1)
Sodium: 141 mmol/L (ref 135–145)
Total Bilirubin: 0.4 mg/dL (ref 0.3–1.2)
Total Protein: 7.2 g/dL (ref 6.5–8.1)

## 2022-04-25 LAB — CBC WITH DIFFERENTIAL/PLATELET
Abs Immature Granulocytes: 0.01 10*3/uL (ref 0.00–0.07)
Basophils Absolute: 0.1 10*3/uL (ref 0.0–0.1)
Basophils Relative: 1 %
Eosinophils Absolute: 0.1 10*3/uL (ref 0.0–0.5)
Eosinophils Relative: 2 %
HCT: 43.1 % (ref 36.0–46.0)
Hemoglobin: 13.4 g/dL (ref 12.0–15.0)
Immature Granulocytes: 0 %
Lymphocytes Relative: 27 %
Lymphs Abs: 1.2 10*3/uL (ref 0.7–4.0)
MCH: 28.6 pg (ref 26.0–34.0)
MCHC: 31.1 g/dL (ref 30.0–36.0)
MCV: 91.9 fL (ref 80.0–100.0)
Monocytes Absolute: 0.4 10*3/uL (ref 0.1–1.0)
Monocytes Relative: 8 %
Neutro Abs: 2.7 10*3/uL (ref 1.7–7.7)
Neutrophils Relative %: 62 %
Platelets: 145 10*3/uL — ABNORMAL LOW (ref 150–400)
RBC: 4.69 MIL/uL (ref 3.87–5.11)
RDW: 17.2 % — ABNORMAL HIGH (ref 11.5–15.5)
WBC: 4.4 10*3/uL (ref 4.0–10.5)
nRBC: 0 % (ref 0.0–0.2)

## 2022-04-25 MED ORDER — SODIUM CHLORIDE 0.9 % IV SOLN
Freq: Once | INTRAVENOUS | Status: AC
  Filled 2022-04-25: qty 250

## 2022-04-25 MED ORDER — HEPARIN SOD (PORK) LOCK FLUSH 100 UNIT/ML IV SOLN
500.0000 [IU] | Freq: Once | INTRAVENOUS | Status: AC | PRN
  Administered 2022-04-25: 500 [IU]
  Filled 2022-04-25: qty 5

## 2022-04-25 MED ORDER — SODIUM CHLORIDE 0.9 % IV SOLN
1200.0000 mg | Freq: Once | INTRAVENOUS | Status: AC
  Administered 2022-04-25: 1200 mg via INTRAVENOUS
  Filled 2022-04-25: qty 20

## 2022-04-25 NOTE — Progress Notes (Signed)
No concerns for the provider. 

## 2022-04-25 NOTE — Patient Instructions (Signed)

## 2022-04-25 NOTE — Patient Instructions (Signed)
[facility]  Discharge Instructions: Thank you for choosing [facility] to provide your oncology and hematology care.  If you have a lab appointment with the [facility], please go directly to the [facility] and check in at the registration area.  Wear comfortable clothing and clothing appropriate for easy access to any Portacath or PICC line.   We strive to give you quality time with your provider. You may need to reschedule your appointment if you arrive late (15 or more minutes).  Arriving late affects you and other patients whose appointments are after yours.  Also, if you miss three or more appointments without notifying the office, you may be dismissed from the clinic at the provider's discretion.      For prescription refill requests, have your pharmacy contact our office and allow 72 hours for refills to be completed.    Today you received the following chemotherapy and/or immunotherapy agents Atezolizumab.      To help prevent nausea and vomiting after your treatment, we encourage you to take your nausea medication as directed.  BELOW ARE SYMPTOMS THAT SHOULD BE REPORTED IMMEDIATELY: *FEVER GREATER THAN 100.4 F (38 C) OR HIGHER *CHILLS OR SWEATING *NAUSEA AND VOMITING THAT IS NOT CONTROLLED WITH YOUR NAUSEA MEDICATION *UNUSUAL SHORTNESS OF BREATH *UNUSUAL BRUISING OR BLEEDING *URINARY PROBLEMS (pain or burning when urinating, or frequent urination) *BOWEL PROBLEMS (unusual diarrhea, constipation, pain near the anus) TENDERNESS IN MOUTH AND THROAT WITH OR WITHOUT PRESENCE OF ULCERS (sore throat, sores in mouth, or a toothache) UNUSUAL RASH, SWELLING OR PAIN  UNUSUAL VAGINAL DISCHARGE OR ITCHING   Items with * indicate a potential emergency and should be followed up as soon as possible or go to the Emergency Department if any problems should occur.  Please show the CHEMOTHERAPY ALERT CARD or IMMUNOTHERAPY ALERT CARD at check-in  to the Emergency Department and triage nurse.  Should you have questions after your visit or need to cancel or reschedule your appointment, please contact [facility]  [phone] and follow the prompts.  Office hours are 8:00 a.m. to 4:30 p.m. Monday - Friday. Please note that voicemails left after 4:00 p.m. may not be returned until the following business day.  We are closed weekends and major holidays. You have access to a nurse at all times for urgent questions. Please call the main number to the clinic [phone] and follow the prompts.  For any non-urgent questions, you may also contact your provider using MyChart. We now offer e-Visits for anyone 46 and older to request care online for non-urgent symptoms. For details visit [email or web address].   Also download the MyChart app! Go to the app store, search "MyChart", open the app, select [facility], and log in with your MyChart username and password.

## 2022-04-25 NOTE — Addendum Note (Signed)
Addended by: Luella Cook on: 04/25/2022 02:27 PM   Modules accepted: Orders

## 2022-04-25 NOTE — Progress Notes (Signed)
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  She has been referred to [place] for findings of bone metastases and her recent MRI.  She has a prior history of 3 packs/day day smoking for over 45 years and quit smoking 5 years ago.She had a CT chest abdomen pelvis in 2018 which showed a 5 mm lung nodule in the left lower lobe.  Recently over the last 2 months patient has been having worsening back pain and was referred to orthopedics.  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  Pathologic fracture of S1.   PET scan showed 2 RUL lung nodules, hilar and mediastinal adenopathy and widespread bone mets. MRI brain negative.   Patient completed palliative RT to her spine. Bronchoscopy showed small cell lung cancer.  Palliative carboplatin, etoposide and Tecentriq started on 08/18/2018. Scans after 4 cycles showed stable disease. 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  She reports being compliant but her values  fluctuate widely.    Interval history-patient denies any complaints today.  She has baseline confusion/dementia for which she follows up with neurology.  She appears to have had a recent fall at home and when I asked her how did she have the fall she said she ran and banged into a door.  ECOG PS- 1 Pain scale- 0   Review of systems- Review of Systems  Constitutional:  Negative for chills, fever, malaise/fatigue and weight loss.  HENT:  Negative for congestion, ear discharge and nosebleeds.   Eyes:  Negative for blurred vision.  Respiratory:  Negative for cough, hemoptysis, sputum production, shortness of breath and wheezing.   Cardiovascular:  Negative for chest pain, palpitations, orthopnea and claudication.  Gastrointestinal:  Negative for abdominal pain, blood in stool, constipation, diarrhea, heartburn, melena, nausea and vomiting.  Genitourinary:  Negative for dysuria, flank pain, frequency, hematuria and urgency.  Musculoskeletal:  Negative for back pain, joint pain and myalgias.  Skin:  Negative for rash.  Neurological:  Negative for dizziness, tingling, focal weakness, seizures, weakness and headaches.  Endo/Heme/Allergies:  Does not bruise/bleed easily.  Psychiatric/Behavioral:  Negative for depression and suicidal ideas. The patient does not have insomnia.       Allergies  Allergen Reactions   Bee Venom Swelling     Past Medical History:  Diagnosis Date   Arthritis    Cirrhosis ([place])    Dementia ([place])    Depression    GERD (gastroesophageal reflux disease)    Hyperlipidemia    Hypertension    Lung cancer ([place])    Metastatic bone cancer      Past Surgical History:  Procedure  Laterality Date   APPENDECTOMY  1971   CHOLECYSTECTOMY  1971   COLONOSCOPY WITH PROPOFOL N/A 11/15/2016   Procedure: COLONOSCOPY WITH PROPOFOL;  Surgeon: Jonathon Bellows, MD;  Location[facility] ENDOSCOPY;  Service: Gastroenterology;  Laterality: N/A;   ENDOBRONCHIAL ULTRASOUND Right 07/30/2018    Procedure: ENDOBRONCHIAL ULTRASOUND;  Surgeon: Laverle Hobby, MD;  Location: [facility] ORS;  Service: Pulmonary;  Laterality: Right;   ESOPHAGOGASTRODUODENOSCOPY (EGD) WITH PROPOFOL N/A 01/07/2017   Procedure: ESOPHAGOGASTRODUODENOSCOPY (EGD) WITH PROPOFOL;  Surgeon: Jonathon Bellows, MD;  Location: [facility] ENDOSCOPY;  Service: Gastroenterology;  Laterality: N/A;   LAPAROSCOPY N/A 03/01/2017   Procedure: LAPAROSCOPY DIAGNOSTIC;  Surgeon: Robert Bellow, MD;  Location: [facility] ORS;  Service: General;  Laterality: N/A;   PORTA CATH INSERTION N/A 08/14/2018   Procedure: PORTA CATH INSERTION;  Surgeon: Algernon Huxley, MD;  Location: [facility];  Service: Cardiovascular;  Laterality: N/A;   TONSILECTOMY, ADENOIDECTOMY, BILATERAL MYRINGOTOMY AND TUBES  1955   TONSILLECTOMY     VENTRAL HERNIA REPAIR N/A 03/01/2017   10 x 14 CM Ventralight ST mesh, intraperitoneal location.    VENTRAL HERNIA REPAIR N/A 03/01/2017   Procedure: HERNIA REPAIR VENTRAL ADULT;  Surgeon: Robert Bellow, MD;  Location: [facility] ORS;  Service: General;  Laterality: N/A;    Social History   Socioeconomic History   Marital status: Married    Spouse name: Johnny   Number of children: Not on file   Years of education: Not on file   Highest education level: Not on file  Occupational History   Not on file  Tobacco Use   Smoking status: Former    Packs/day: 2.00    Years: 50.00    Additional pack years: 0.00    Total pack years: 100.00    Types: Cigarettes, E-cigarettes    Quit date: 02/07/2012    Years since quitting: 10.2   Smokeless tobacco: Never  Vaping Use   Vaping Use: Former   Start date: 10/06/2013   Devices: uses no liquid  Substance and Sexual Activity   Alcohol use: No    Alcohol/week: 0.0 standard drinks of alcohol   Drug use: No   Sexual activity: Yes  Other Topics Concern   Not on file  Social History Narrative   Married   Retired   Clinical cytogeneticist level of education    No children    1 cup  of coffee   Social Determinants of Health   Financial Resource Strain: Low Risk  (09/08/2020)   Overall Financial Resource Strain (CARDIA)    Difficulty of Paying Living Expenses: Not hard at all  Food Insecurity: No Food Insecurity (04/20/2022)   Hunger Vital Sign    Worried About Running Out of Food in the Last Year: Never true    [place] in the Last Year: Never true  Transportation Needs: Unmet Transportation Needs (04/20/2022)   PRAPARE - Transportation    Lack of Transportation (Medical): No    Lack of Transportation (Non-Medical): Yes  Physical Activity: Unknown (10/29/2018)   Exercise Vital Sign    Days of Exercise per Week: Patient declined    Minutes of Exercise per Session: Patient declined  Stress: No Stress Concern Present (09/08/2020)   [place]    Feeling of Stress : Not at all  Social Connections: Unknown (09/08/2020)   Social Connection and Isolation Panel [NHANES]    Frequency of Communication with Friends and Family: More than three times  a week    Frequency of Social Gatherings with Friends and Family: Not on file    Attends Religious Services: Not on file    Active Member of Clubs or Organizations: Not on file    Attends Archivist Meetings: Not on file    Marital Status: Married  Intimate Partner Violence: Not At Risk (09/08/2020)   Humiliation, Afraid, Rape, and Kick questionnaire    Fear of Current or Ex-Partner: No    Emotionally Abused: No    Physically Abused: No    Sexually Abused: No    Family History  Problem Relation Age of Onset   Hypertension Mother    Ovarian cancer Mother 9   Dementia Mother    Heart disease Father    Stroke Father    Seizures Father    Ovarian cancer Sister        ? dx cancer had hyst.   Breast cancer Neg Hx      Current Outpatient Medications:    amLODipine (NORVASC) 5 MG tablet, Take 1 tablet (5 mg total) by mouth daily., Disp: 90  tablet, Rfl: 3   buPROPion (WELLBUTRIN XL) 300 MG 24 hr tablet, Take 1 tablet by mouth once daily, Disp: 90 tablet, Rfl: 3   divalproex (DEPAKOTE) 250 MG DR tablet, Take 1 tablet (250 mg total) by mouth 2 (two) times daily., Disp: 60 tablet, Rfl: 11   ibuprofen (ADVIL,MOTRIN) 200 MG tablet, Take 400 mg by mouth every 8 (eight) hours as needed for headache or moderate pain., Disp: , Rfl:    levothyroxine (SYNTHROID) 125 MCG tablet, Take 1 tablet (125 mcg total) by mouth daily before breakfast. Take the pill first thing in the morning. Do not take other pills at the same time., Disp: 30 tablet, Rfl: 3   memantine (NAMENDA) 10 MG tablet, Take 1 tablet (10 mg total) by mouth 2 (two) times daily., Disp: 60 tablet, Rfl: 11   Multiple Vitamin (MULTIVITAMIN) tablet, Take 1 tablet by mouth daily., Disp: , Rfl:    Omega-3 Fatty Acids (FISH OIL) 300 MG CAPS, Take 1 capsule by mouth daily., Disp: , Rfl:    ondansetron (ZOFRAN) 4 MG tablet, Take 1 tablet (4 mg total) by mouth every 6 (six) hours., Disp: 12 tablet, Rfl: 0   pantoprazole (PROTONIX) 40 MG tablet, TAKE 1 TABLET BY MOUTH TWICE DAILY BEFORE A MEAL, Disp: 180 tablet, Rfl: 0   potassium chloride SA (KLOR-CON M) 20 MEQ tablet, Take 1 tablet by mouth once daily, Disp: 14 tablet, Rfl: 0   QUEtiapine (SEROQUEL) 25 MG tablet, TAKE 1/2 (ONE-HALF) TABLET BY MOUTH AT BEDTIME, Disp: 15 tablet, Rfl: 0   rivastigmine (EXELON) 3 MG capsule, Take 1 capsule (3 mg total) by mouth 2 (two) times daily., Disp: 60 capsule, Rfl: 11   rosuvastatin (CRESTOR) 20 MG tablet, Take 1 tablet by mouth once daily, Disp: 90 tablet, Rfl: 0   sertraline (ZOLOFT) 100 MG tablet, Take 2 tablets (200 mg total) by mouth daily., Disp: 180 tablet, Rfl: 1 No current facility-administered medications for this visit.  Facility-Administered Medications Ordered in Other Visits:    denosumab (XGEVA) injection 120 mg, 120 mg, Subcutaneous, Q30 days, Sindy Guadeloupe, MD, 120 mg at 03/05/19 1525    heparin lock flush 100 UNIT/ML injection, , , ,   Physical exam:  Vitals:   04/25/22 1330 04/25/22 1333  BP: (!) 170/80 (!) 163/82  Pulse: 80 79  Resp: 18   Temp: (!) 96.5 F (35.8  C)   TempSrc: Tympanic   SpO2: 100%   Weight: 157 lb 4.8 oz (71.4 kg)    Physical Exam Cardiovascular:     Rate and Rhythm: Normal rate and regular rhythm.     Heart sounds: Normal heart sounds.  Pulmonary:     Effort: Pulmonary effort is normal.     Breath sounds: Normal breath sounds.  Abdominal:     General: Bowel sounds are normal.     Palpations: Abdomen is soft.  Skin:    General: Skin is warm and dry.     Comments: Bruising noted over the right eye  Neurological:     Mental Status: She is alert and oriented to person, place, and time.         Latest Ref Rng & Units 04/05/2022    8:36 PM  CMP  Glucose 70 - 99 mg/dL 86   BUN 8 - 23 mg/dL 16   Creatinine 0.44 - 1.00 mg/dL 0.93   Sodium 135 - 145 mmol/L 140   Potassium 3.5 - 5.1 mmol/L 3.7   Chloride 98 - 111 mmol/L 101   CO2 22 - 32 mmol/L 29   Calcium 8.9 - 10.3 mg/dL 9.1   Total Protein 6.5 - 8.1 g/dL 7.4   Total Bilirubin 0.3 - 1.2 mg/dL 0.3   Alkaline Phos 38 - 126 U/L 81   AST 15 - 41 U/L 42   ALT 0 - 44 U/L 33       Latest Ref Rng & Units 04/25/2022    1:15 PM  CBC  WBC 4.0 - 10.5 K/uL 4.4   Hemoglobin 12.0 - 15.0 g/dL 13.4   Hematocrit 36.0 - 46.0 % 43.1   Platelets 150 - 400 K/uL 145     No images are attached to the encounter.  DG Chest Portable 1 View  Result Date: 04/06/2022 CLINICAL DATA:  Known lung cancer, mild desaturations. EXAM: PORTABLE CHEST 1 VIEW COMPARISON:  CT chest 12/28/2020 and radiographs 07/22/2021 FINDINGS: Stable cardiomediastinal silhouette. Aortic atherosclerotic calcification. Right chest wall Port-A-Cath with tip at the superior cavoatrial junction. Low lung volumes accentuate pulmonary vascularity. Left basilar atelectasis/scarring. No focal pneumonia, pleural effusion, or pneumothorax.  IMPRESSION: No active disease.  Low lung volumes. Electronically Signed   By: Placido Sou M.D.   On: 04/06/2022 00:46   CT HEAD WO CONTRAST (5MM)  Result Date: 04/05/2022 CLINICAL DATA:  Mental status change EXAM: CT HEAD WITHOUT CONTRAST TECHNIQUE: Contiguous axial images were obtained from the base of the skull through the vertex without intravenous contrast. RADIATION DOSE REDUCTION: This exam was performed according to the departmental dose-optimization program which includes automated exposure control, adjustment of the mA and/or kV according to patient size and/or use of iterative reconstruction technique. COMPARISON:  Head CT 02/07/2022 FINDINGS: Brain: No evidence of acute infarction, hemorrhage, hydrocephalus, extra-axial collection or mass lesion/mass effect. Vascular: Atherosclerotic calcifications are present within the cavernous internal carotid arteries. Skull: Normal. Negative for fracture or focal lesion. Sinuses/Orbits: No acute finding. Other: None. IMPRESSION: No acute intracranial abnormality. Electronically Signed   By: Ronney Asters M.D.   On: 04/05/2022 21:50     Assessment and plan- Patient is a 72 y.o. 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  She is here for on treatment assessment prior to cycle 56 of palliative Tecentriq.    Okay to proceed with cycle 56 of palliative Tecentriq today and I will see her back in 3 weeks for cycle  57.  Plan to repeat CT chest abdomen and pelvis with contrast about 5 weeks from now.  If scans show stable disease I will hold off on giving her any further immunotherapy and continue to watch her off treatment.  Autoimmune hypothyroidism: Will repeat TSH and free T4 in 3 weeks   Visit Diagnosis 1. Encounter for antineoplastic immunotherapy   2. Small cell lung cancer, right upper lobe ([place])      Dr. Randa Evens, MD, MPH [facility] [id number] 04/25/2022 2:12 PM

## 2022-05-03 ENCOUNTER — Ambulatory Visit: Payer: Self-pay

## 2022-05-03 NOTE — Patient Outreach (Signed)
  Care Coordination   05/03/2022 Name: Anne [name] MRN: [id number] DOB: 1950/06/07   Care Coordination Outreach Attempts:  An unsuccessful telephone outreach was attempted for a scheduled appointment today.   Telephone to listed mobile number x 2.  Voice message left.  Attempted call to listed home and mobile number.   Follow Up Plan:  Additional outreach attempts will be made to offer the patient care coordination information and services.   Encounter Outcome:  No Answer   Care Coordination Interventions:  No, not indicated    Quinn Plowman [facility] [place] [phone] direct line

## 2022-05-07 ENCOUNTER — Telehealth: Payer: Self-pay | Admitting: *Deleted

## 2022-05-07 NOTE — Progress Notes (Signed)
  Care Coordination Note  05/07/2022 Name: CAYLEIGH FLAGLER MRN: [id number] DOB: 30-May-1950  MAYTTE HAMMERMEISTER is a 72 y.o. year old female who is a primary care patient of Leone Haven, MD and is actively engaged with the care management team. I reached out to [place] by phone today to assist with re-scheduling a follow up visit with the RN Case Manager  Follow up plan: Patient declines further follow up and engagement by the care management team. Appropriate care team members and provider have been notified via electronic communication.   Julian Hy, [place] Direct Dial: [phone]

## 2022-05-09 ENCOUNTER — Encounter: Payer: Self-pay | Admitting: Neurology

## 2022-05-09 ENCOUNTER — Ambulatory Visit: Payer: PPO | Admitting: Neurology

## 2022-05-09 ENCOUNTER — Encounter: Payer: Self-pay | Admitting: Oncology

## 2022-05-09 VITALS — BP 93/57 | HR 88 | Ht 66.0 in | Wt 157.0 lb

## 2022-05-09 DIAGNOSIS — G301 Alzheimer's disease with late onset: Secondary | ICD-10-CM

## 2022-05-09 DIAGNOSIS — R569 Unspecified convulsions: Secondary | ICD-10-CM

## 2022-05-09 DIAGNOSIS — F02A18 Dementia in other diseases classified elsewhere, mild, with other behavioral disturbance: Secondary | ICD-10-CM | POA: Diagnosis not present

## 2022-05-09 MED ORDER — QUETIAPINE FUMARATE 25 MG PO TABS: 12.5000 mg | ORAL_TABLET | Freq: Two times a day (BID) | ORAL | 11 refills | Status: DC

## 2022-05-09 NOTE — Progress Notes (Signed)
[name]  PATIENT: Anne [name] DOB: October 24, 1950  REQUESTING CLINICIAN: Leone Haven, MD HISTORY FROM: Patient and husband  REASON FOR VISIT: Memory decline    HISTORICAL  CHIEF COMPLAINT:  Chief Complaint  Patient presents with   Follow-up    Rm 16,  Here for refills   INTERVAL HISTORY 05/09/2022:  Patient presents today for follow-up, she is accompanied by her husband.  Since last visit in January, husband reports patient has been doing better.  At 1 point he did have a UTI and at that time was very confused and her memory got worse.  Since the UTI has been treated she has been doing well.  She tolerates the Seroquel well, currently on 12.5 mg nightly but still of behavioral issues described as hallucinations, seeing things that are not there, she thinks that her daughter and grandchildren live in the house, talks about them all the time but they live in [place].  She is compliant with the rest of her medication including Exelon, Namenda and Depakote.  She had follow-up with her PCP who referred patient to home health. No seizures since last visit.    INTERVAL HISTORY 02/19/2022:  Patient presents today for follow-up, she is accompanied by [facility].  He reports that memory is getting poor and worse.  She is compliant with the Exelon 1.5 mg twice daily but has not seen any improvement.  They are time that she still wants to leave the house but has not done so.  She is on Seroquel 12.5 mg nightly.  Johnny reports that sleep is good.  He is the only one who helps her; He does work however 3 times a week and there are times where patient is at home alone. There is also of a seizure a month ago. Seizures described as staring spell and being unresponsive. Johnny reports the day of the headaches, she complained of bad headaches. She has not had any additional events since leaving the hospital.    INTERVAL HISTORY 08/15/21 Patient presents today with husband for  follow up worsening memory, and additional behavioral changes. She has been non compliant with her medications. Husband has noted that patient has been packing up the car thinking she is about ot move. She has been talking about her mother in [place] when her mother passed away.  Currently she is not  driving, last time she drove to [place] for a weddig when there was no wedding, husband has to call a silver alert on her. At home, she does not cook, use the microwave for frozen dinner. Husband reports that she needs help with showeing and dressing. She misplaces items all the time.  They already have home health services that come once a week and there are complaints of multiple falls.    HISTORY OF PRESENT ILLNESS:  This is a 72 year old woman with past medical history of lung cancer currently getting chemo every 3 to 4 weeks, with bone metastasis, depression (severe during wintertime) hypertension, hypothyroidism, and hyperlipidemia who is presenting with memory decline.  Patient stated that her memory is terrible, she forgets a lot of things.  She has issue with remote memory and also report forgetting recent conversations.  She also misplaces a lot of things.  She currently drives, denies any recent accident, denies being lost in familiar places.  She can handle her financial affairs by herself, denies being late on her bills.  She knows her family members well, do not confuse them with  other people.  She has started Prevagen and reports some improvement with the medication.   Husband stated patient is more forgetful than before, and mainly misplace a lot of things.  He has to repeat himself multiple times because patient will keep on asking the same questions over and over.   She has not been wandering outside her house.  She is able to complete all activities of daily living, she is able to cook, clean, bathe and dress herself.     OTHER MEDICAL CONDITIONS: lung cancer chemo in remission but  still undergoing treatment every 3 to 4 weeks, Depression (severe during the winter), HTN, Hypothyroidism, HLD   REVIEW OF SYSTEMS: Full 14 system review of systems performed and negative with exception of: as noted in the HPI   ALLERGIES: Allergies  Allergen Reactions   Bee Venom Swelling    HOME MEDICATIONS: Outpatient Medications Prior to Visit  Medication Sig Dispense Refill   amLODipine (NORVASC) 5 MG tablet Take 1 tablet (5 mg total) by mouth daily. 90 tablet 3   buPROPion (WELLBUTRIN XL) 300 MG 24 hr tablet Take 1 tablet by mouth once daily 90 tablet 3   divalproex (DEPAKOTE) 250 MG DR tablet Take 1 tablet (250 mg total) by mouth 2 (two) times daily. 60 tablet 11   ibuprofen (ADVIL,MOTRIN) 200 MG tablet Take 400 mg by mouth every 8 (eight) hours as needed for headache or moderate pain.     levothyroxine (SYNTHROID) 125 MCG tablet Take 1 tablet (125 mcg total) by mouth daily before breakfast. Take the pill first thing in the morning. Do not take other pills at the same time. 30 tablet 3   memantine (NAMENDA) 10 MG tablet Take 1 tablet (10 mg total) by mouth 2 (two) times daily. 60 tablet 11   Multiple Vitamin (MULTIVITAMIN) tablet Take 1 tablet by mouth daily.     Omega-3 Fatty Acids (FISH OIL) 300 MG CAPS Take 1 capsule by mouth daily.     ondansetron (ZOFRAN) 4 MG tablet Take 1 tablet (4 mg total) by mouth every 6 (six) hours. 12 tablet 0   pantoprazole (PROTONIX) 40 MG tablet TAKE 1 TABLET BY MOUTH TWICE DAILY BEFORE A MEAL 180 tablet 0   potassium chloride SA (KLOR-CON M) 20 MEQ tablet Take 1 tablet by mouth once daily 14 tablet 0   rivastigmine (EXELON) 3 MG capsule Take 1 capsule (3 mg total) by mouth 2 (two) times daily. 60 capsule 11   rosuvastatin (CRESTOR) 20 MG tablet Take 1 tablet by mouth once daily 90 tablet 0   sertraline (ZOLOFT) 100 MG tablet Take 2 tablets (200 mg total) by mouth daily. 180 tablet 1   QUEtiapine (SEROQUEL) 25 MG tablet TAKE 1/2 (ONE-HALF) TABLET BY  MOUTH AT BEDTIME 15 tablet 0   Facility-Administered Medications Prior to Visit  Medication Dose Route Frequency Provider Last Rate Last Admin   denosumab (XGEVA) injection 120 mg  120 mg Subcutaneous Q30 days Sindy Guadeloupe, MD   120 mg at 03/05/19 1525   heparin lock flush 100 UNIT/ML injection             PAST MEDICAL HISTORY: Past Medical History:  Diagnosis Date   Arthritis    Cirrhosis    Dementia    Depression    GERD (gastroesophageal reflux disease)    Hyperlipidemia    Hypertension    Lung cancer    Metastatic bone cancer     PAST SURGICAL HISTORY: Past Surgical History:  Procedure Laterality Date   APPENDECTOMY  1971   CHOLECYSTECTOMY  1971   COLONOSCOPY WITH PROPOFOL N/A 11/15/2016   Procedure: COLONOSCOPY WITH PROPOFOL;  Surgeon: Jonathon Bellows, MD;  Location: [facility] ENDOSCOPY;  Service: Gastroenterology;  Laterality: N/A;   ENDOBRONCHIAL ULTRASOUND Right 07/30/2018   Procedure: ENDOBRONCHIAL ULTRASOUND;  Surgeon: Laverle Hobby, MD;  Location: [facility] ORS;  Service: Pulmonary;  Laterality: Right;   ESOPHAGOGASTRODUODENOSCOPY (EGD) WITH PROPOFOL N/A 01/07/2017   Procedure: ESOPHAGOGASTRODUODENOSCOPY (EGD) WITH PROPOFOL;  Surgeon: Jonathon Bellows, MD;  Location: [facility] ENDOSCOPY;  Service: Gastroenterology;  Laterality: N/A;   LAPAROSCOPY N/A 03/01/2017   Procedure: LAPAROSCOPY DIAGNOSTIC;  Surgeon: Robert Bellow, MD;  Location: [facility] ORS;  Service: General;  Laterality: N/A;   PORTA CATH INSERTION N/A 08/14/2018   Procedure: PORTA CATH INSERTION;  Surgeon: Algernon Huxley, MD;  Location: [facility];  Service: Cardiovascular;  Laterality: N/A;   TONSILECTOMY, ADENOIDECTOMY, BILATERAL MYRINGOTOMY AND TUBES  1955   TONSILLECTOMY     VENTRAL HERNIA REPAIR N/A 03/01/2017   10 x 14 CM Ventralight ST mesh, intraperitoneal location.    VENTRAL HERNIA REPAIR N/A 03/01/2017   Procedure: HERNIA REPAIR VENTRAL ADULT;  Surgeon: Robert Bellow, MD;  Location: [facility] ORS;   Service: General;  Laterality: N/A;    FAMILY HISTORY: Family History  Problem Relation Age of Onset   Hypertension Mother    Ovarian cancer Mother 76   Dementia Mother    Heart disease Father    Stroke Father    Seizures Father    Ovarian cancer Sister        ? dx cancer had hyst.   Breast cancer Neg Hx     SOCIAL HISTORY: Social History   Socioeconomic History   Marital status: Married    Spouse name: Johnny   Number of children: Not on file   Years of education: Not on file   Highest education level: Not on file  Occupational History   Not on file  Tobacco Use   Smoking status: Former    Packs/day: 2.00    Years: 50.00    Additional pack years: 0.00    Total pack years: 100.00    Types: Cigarettes, E-cigarettes    Quit date: 02/07/2012    Years since quitting: 10.2   Smokeless tobacco: Never  Vaping Use   Vaping Use: Former   Start date: 10/06/2013   Devices: uses no liquid  Substance and Sexual Activity   Alcohol use: No    Alcohol/week: 0.0 standard drinks of alcohol   Drug use: No   Sexual activity: Yes  Other Topics Concern   Not on file  Social History Narrative   Married   Retired   Clinical cytogeneticist level of education    No children    1 cup of coffee   Social Determinants of Health   Financial Resource Strain: Low Risk  (09/08/2020)   Overall Financial Resource Strain (CARDIA)    Difficulty of Paying Living Expenses: Not hard at all  Food Insecurity: No Food Insecurity (04/20/2022)   Hunger Vital Sign    Worried About Running Out of Food in the Last Year: Never true    Ran Out of Food in the Last Year: Never true  Transportation Needs: Unmet Transportation Needs (04/20/2022)   PRAPARE - Transportation    Lack of Transportation (Medical): No    Lack of Transportation (Non-Medical): Yes  Physical Activity: Unknown (10/29/2018)   Exercise Vital Sign    Days  of Exercise per Week: Patient declined    Minutes of Exercise per Session: Patient  declined  Stress: No Stress Concern Present (09/08/2020)   [place]    Feeling of Stress : Not at all  Social Connections: Unknown (09/08/2020)   Social Connection and Isolation Panel [NHANES]    Frequency of Communication with Friends and Family: More than three times a week    Frequency of Social Gatherings with Friends and Family: Not on file    Attends Religious Services: Not on file    Active Member of Clubs or Organizations: Not on file    Attends Archivist Meetings: Not on file    Marital Status: Married  Intimate Partner Violence: Not At Risk (09/08/2020)   Humiliation, Afraid, Rape, and Kick questionnaire    Fear of Current or Ex-Partner: No    Emotionally Abused: No    Physically Abused: No    Sexually Abused: No     PHYSICAL EXAM  GENERAL EXAM/CONSTITUTIONAL: Vitals:  Vitals:   05/09/22 1133  BP: (!) 93/57  Pulse: 88  Weight: 157 lb (71.2 kg)  Height: 5\' 6"  (1.676 m)   Body mass index is 25.34 kg/m. Wt Readings from Last 3 Encounters:  05/09/22 157 lb (71.2 kg)  04/25/22 157 lb 4.8 oz (71.4 kg)  04/11/22 156 lb 6.4 oz (70.9 kg)   Patient is in no distress; well developed, nourished and groomed; neck is supple  EYES: Visual fields full to confrontation, Extraocular movements intacts,   MUSCULOSKELETAL: Gait, strength, tone, movements noted in Neurologic exam below  NEUROLOGIC: MENTAL STATUS:     01/19/2021    8:32 AM 03/14/2020   10:47 AM 06/04/2017    9:34 AM  MMSE - Mini Mental State Exam  Orientation to time 4 4 5   Orientation to Place 5 5 5   Registration 3 3 3   Attention/ Calculation 2 5 5   Recall 0 0 0  Language- name 2 objects 2 2 2   Language- repeat 1 1 1   Language- follow 3 step command 3 2 3   Language- read & follow direction 1 1 1   Write a sentence 1 1 1   Copy design 1 1 1   Total score 23 25 27       02/19/2022    9:52 AM 08/15/2021    1:32 PM  [place]  Cognitive Assessment   Visuospatial/ Executive (0/5) 1 2  Naming (0/3) 1 2  Attention: Read list of digits (0/2) 2 2  Attention: Read list of letters (0/1) 1 1  Attention: Serial 7 subtraction starting at 100 (0/3) 2 3  Language: Repeat phrase (0/2) 2 2  Language : Fluency (0/1) 0 0  Abstraction (0/2) 1 1  Delayed Recall (0/5) 0 0  Orientation (0/6) 2 3  Total 12 16  Adjusted Score (based on education) 12      CRANIAL NERVE:  2nd, 3rd, 4th, 6th - visual fields full to confrontation, extraocular muscles intact, no nystagmus 5th - facial sensation symmetric 7th - facial strength symmetric 8th - hearing intact 9th - palate elevates symmetrically, uvula midline 11th - shoulder shrug symmetric 12th - tongue protrusion midline  MOTOR:  normal bulk and tone, full strength in the BUE, BLE  SENSORY:  normal and symmetric to light touch  COORDINATION:  finger-nose-finger, fine finger movements normal  GAIT/STATION:  Wide base, does not use an assistive device.    DIAGNOSTIC DATA (LABS, IMAGING, TESTING) - I reviewed  patient records, labs, notes, testing and imaging myself where available.  Lab Results  Component Value Date   WBC 4.4 04/25/2022   HGB 13.4 04/25/2022   HCT 43.1 04/25/2022   MCV 91.9 04/25/2022   PLT 145 (L) 04/25/2022      Component Value Date/Time   NA 141 04/25/2022 1315   NA 142 07/17/2021 1347   K 3.6 04/25/2022 1315   CL 103 04/25/2022 1315   CO2 27 04/25/2022 1315   GLUCOSE 75 04/25/2022 1315   BUN 13 04/25/2022 1315   BUN 12 07/17/2021 1347   CREATININE 0.96 04/25/2022 1315   CREATININE 1.09 02/26/2012 0925   CALCIUM 9.0 04/25/2022 1315   PROT 7.2 04/25/2022 1315   PROT 7.1 07/17/2021 1347   ALBUMIN 3.8 04/25/2022 1315   ALBUMIN 4.5 07/17/2021 1347   AST 41 04/25/2022 1315   ALT 29 04/25/2022 1315   ALKPHOS 82 04/25/2022 1315   BILITOT 0.4 04/25/2022 1315   BILITOT 0.4 07/17/2021 1347   GFRNONAA >60 04/25/2022 1315   GFRNONAA 54 (L)  02/26/2012 0925   GFRAA >60 11/05/2019 2025   GFRAA >60 02/26/2012 0925   Lab Results  Component Value Date   CHOL 177 08/24/2020   HDL 54.90 08/24/2020   LDLCALC 82 08/24/2020   LDLDIRECT 95.0 07/22/2019   TRIG 198.0 (H) 08/24/2020   CHOLHDL 3 08/24/2020   Lab Results  Component Value Date   HGBA1C 6.1 09/12/2016   Lab Results  Component Value Date   VITAMINB12 1,235 (H) 10/01/2018   Lab Results  Component Value Date   TSH 0.459 04/04/2022    MRI Brain 08/2020 1. No evidence of intracranial metastasis. 2. Evidence of right mandibular head metastasis which is new from 2020 brain MRI and possibly new from a February 2022 bone scan.   Head CT 02/07/2022 1. No acute intracranial abnormality. 2. Chronic microvascular disease. 3. Mild sphenoid sinus and left posterior ethmoid air cell inflammation.    ASSESSMENT AND PLAN  72 y.o. year old female with history lung cancer on chemo with bone metastasis, depression (severe during wintertime) hypertension, hypothyroidism, and hyperlipidemia who is presenting for follow up.  In terms of dementia, memory is getting worse per son.  She is currently on Exelon 3 mg twice daily and Namenda 10 mg twice daily. She is also on Depakote 250 mg twice daily. Will increase the Seroquel to 12.5 mg twice daily due to confusion, agitation and hallucinations. I have advised them to work with home health services referred by PCP. I will see them in 6 months for follow up.     1. 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 Seizures      Patient Instructions  Continue current medications Exelon 3 mg twice daily Namenda 10 mg twice daily Depakote to 50 mg twice daily Will obtain ATN profile to look for Alzheimer disease biomarkers Continue to follow with PCP Follow-up in 6 months or sooner if worse    Orders Placed This Encounter  Procedures   ATN PROFILE     Meds ordered this encounter  Medications    QUEtiapine (SEROQUEL) 25 MG tablet    Sig: Take 0.5 tablets (12.5 mg total) by mouth 2 (two) times daily.    Dispense:  30 tablet    Refill:  11     Return in about 6 months (around 11/08/2022). . I have spent a total of 40 minutes dedicated to this patient today, preparing to  see patient, performing a medically appropriate examination and evaluation, ordering tests and/or medications and procedures, and counseling and educating the patient/family/caregiver; independently interpreting result and communicating results to the family/patient/caregiver; and documenting clinical information in the electronic medical record.   Alric Ran, MD 05/09/2022, 12:31 PM  [name] [address] [place], [place] [address] [phone]

## 2022-05-09 NOTE — Patient Instructions (Signed)
Continue current medications Exelon 3 mg twice daily Namenda 10 mg twice daily Depakote to 50 mg twice daily Will obtain ATN profile to look for Alzheimer disease biomarkers Continue to follow with PCP Follow-up in 6 months or sooner if worse

## 2022-05-11 ENCOUNTER — Ambulatory Visit: Payer: PPO | Admitting: Family Medicine

## 2022-05-12 LAB — ATN PROFILE
A -- Beta-amyloid 42/40 Ratio: 0.087 — ABNORMAL LOW (ref >0.102)
Beta-amyloid 40: 332.89 pg/mL
Beta-amyloid 42: 29.08 pg/mL
N -- NfL, Plasma: 5.74 pg/mL (ref 0.00–7.64)
T -- p-tau181: 3.05 pg/mL — ABNORMAL HIGH (ref 0.00–0.97)

## 2022-05-16 ENCOUNTER — Encounter: Payer: Self-pay | Admitting: Oncology

## 2022-05-16 ENCOUNTER — Inpatient Hospital Stay: Payer: PPO

## 2022-05-16 ENCOUNTER — Inpatient Hospital Stay (HOSPITAL_BASED_OUTPATIENT_CLINIC_OR_DEPARTMENT_OTHER): Payer: PPO | Admitting: Oncology

## 2022-05-16 ENCOUNTER — Ambulatory Visit: Payer: PPO | Admitting: Hospice and Palliative Medicine

## 2022-05-16 ENCOUNTER — Inpatient Hospital Stay: Payer: PPO | Attending: Oncology

## 2022-05-16 VITALS — BP 114/66 | HR 78 | Temp 96.2°F | Resp 18 | Ht 65.0 in | Wt 151.3 lb

## 2022-05-16 DIAGNOSIS — E063 Autoimmune thyroiditis: Secondary | ICD-10-CM | POA: Diagnosis not present

## 2022-05-16 DIAGNOSIS — Z5112 Encounter for antineoplastic immunotherapy: Secondary | ICD-10-CM | POA: Insufficient documentation

## 2022-05-16 DIAGNOSIS — C3411 Malignant neoplasm of upper lobe, right bronchus or lung: Secondary | ICD-10-CM

## 2022-05-16 DIAGNOSIS — C7951 Secondary malignant neoplasm of bone: Secondary | ICD-10-CM | POA: Insufficient documentation

## 2022-05-16 DIAGNOSIS — Z79890 Hormone replacement therapy (postmenopausal): Secondary | ICD-10-CM | POA: Insufficient documentation

## 2022-05-16 DIAGNOSIS — Z8041 Family history of malignant neoplasm of ovary: Secondary | ICD-10-CM | POA: Insufficient documentation

## 2022-05-16 DIAGNOSIS — F0393 Unspecified dementia, unspecified severity, with mood disturbance: Secondary | ICD-10-CM | POA: Diagnosis not present

## 2022-05-16 DIAGNOSIS — Z9221 Personal history of antineoplastic chemotherapy: Secondary | ICD-10-CM | POA: Insufficient documentation

## 2022-05-16 DIAGNOSIS — Z87891 Personal history of nicotine dependence: Secondary | ICD-10-CM | POA: Insufficient documentation

## 2022-05-16 LAB — CBC WITH DIFFERENTIAL/PLATELET
Abs Immature Granulocytes: 0.02 10*3/uL (ref 0.00–0.07)
Basophils Absolute: 0 10*3/uL (ref 0.0–0.1)
Basophils Relative: 1 %
Eosinophils Absolute: 0.1 10*3/uL (ref 0.0–0.5)
Eosinophils Relative: 3 %
HCT: 43.3 % (ref 36.0–46.0)
Hemoglobin: 13.6 g/dL (ref 12.0–15.0)
Immature Granulocytes: 0 %
Lymphocytes Relative: 21 %
Lymphs Abs: 1.2 10*3/uL (ref 0.7–4.0)
MCH: 28.7 pg (ref 26.0–34.0)
MCHC: 31.4 g/dL (ref 30.0–36.0)
MCV: 91.4 fL (ref 80.0–100.0)
Monocytes Absolute: 0.5 10*3/uL (ref 0.1–1.0)
Monocytes Relative: 8 %
Neutro Abs: 3.7 10*3/uL (ref 1.7–7.7)
Neutrophils Relative %: 67 %
Platelets: 151 10*3/uL (ref 150–400)
RBC: 4.74 MIL/uL (ref 3.87–5.11)
RDW: 16.3 % — ABNORMAL HIGH (ref 11.5–15.5)
WBC: 5.5 10*3/uL (ref 4.0–10.5)
nRBC: 0 % (ref 0.0–0.2)

## 2022-05-16 LAB — COMPREHENSIVE METABOLIC PANEL
ALT: 26 U/L (ref 0–44)
AST: 33 U/L (ref 15–41)
Albumin: 3.6 g/dL (ref 3.5–5.0)
Alkaline Phosphatase: 87 U/L (ref 38–126)
Anion gap: 9 (ref 5–15)
BUN: 15 mg/dL (ref 8–23)
CO2: 27 mmol/L (ref 22–32)
Calcium: 8.8 mg/dL — ABNORMAL LOW (ref 8.9–10.3)
Chloride: 103 mmol/L (ref 98–111)
Creatinine, Ser: 1.13 mg/dL — ABNORMAL HIGH (ref 0.44–1.00)
GFR, Estimated: 51 mL/min — ABNORMAL LOW (ref >60)
Glucose, Bld: 90 mg/dL (ref 70–99)
Potassium: 3.8 mmol/L (ref 3.5–5.1)
Sodium: 139 mmol/L (ref 135–145)
Total Bilirubin: 0.4 mg/dL (ref 0.3–1.2)
Total Protein: 7.1 g/dL (ref 6.5–8.1)

## 2022-05-16 MED ORDER — SODIUM CHLORIDE 0.9 % IV SOLN
1200.0000 mg | Freq: Once | INTRAVENOUS | Status: AC
  Administered 2022-05-16: 1200 mg via INTRAVENOUS
  Filled 2022-05-16: qty 20

## 2022-05-16 MED ORDER — SODIUM CHLORIDE 0.9 % IV SOLN
Freq: Once | INTRAVENOUS | Status: AC
  Filled 2022-05-16: qty 250

## 2022-05-16 MED ORDER — HEPARIN SOD (PORK) LOCK FLUSH 100 UNIT/ML IV SOLN
500.0000 [IU] | Freq: Once | INTRAVENOUS | Status: AC | PRN
  Administered 2022-05-16: 500 [IU]
  Filled 2022-05-16: qty 5

## 2022-05-16 NOTE — Patient Instructions (Signed)
[place] [facility]  Discharge Instructions: Thank you for choosing [facility] to provide your oncology and hematology care.  If you have a lab appointment with the Cancer Center, please go directly to the Cancer Center and check in at the registration area.  Wear comfortable clothing and clothing appropriate for easy access to any Portacath or PICC line.   We strive to give you quality time with your provider. You may need to reschedule your appointment if you arrive late (15 or more minutes).  Arriving late affects you and other patients whose appointments are after yours.  Also, if you miss three or more appointments without notifying the office, you may be dismissed from the clinic at the provider's discretion.      For prescription refill requests, have your pharmacy contact our office and allow 72 hours for refills to be completed.    Today you received the following chemotherapy and/or immunotherapy agents TECENTRIQ      To help prevent nausea and vomiting after your treatment, we encourage you to take your nausea medication as directed.  BELOW ARE SYMPTOMS THAT SHOULD BE REPORTED IMMEDIATELY: *FEVER GREATER THAN 100.4 F (38 C) OR HIGHER *CHILLS OR SWEATING *NAUSEA AND VOMITING THAT IS NOT CONTROLLED WITH YOUR NAUSEA MEDICATION *UNUSUAL SHORTNESS OF BREATH *UNUSUAL BRUISING OR BLEEDING *URINARY PROBLEMS (pain or burning when urinating, or frequent urination) *BOWEL PROBLEMS (unusual diarrhea, constipation, pain near the anus) TENDERNESS IN MOUTH AND THROAT WITH OR WITHOUT PRESENCE OF ULCERS (sore throat, sores in mouth, or a toothache) UNUSUAL RASH, SWELLING OR PAIN  UNUSUAL VAGINAL DISCHARGE OR ITCHING   Items with * indicate a potential emergency and should be followed up as soon as possible or go to the Emergency Department if any problems should occur.  Please show the CHEMOTHERAPY ALERT CARD or IMMUNOTHERAPY ALERT CARD at check-in to  the Emergency Department and triage nurse.  Should you have questions after your visit or need to cancel or reschedule your appointment, please contact [place] [facility]  [phone] and follow the prompts.  Office hours are 8:00 a.m. to 4:30 p.m. Monday - Friday. Please note that voicemails left after 4:00 p.m. may not be returned until the following business day.  We are closed weekends and major holidays. You have access to a nurse at all times for urgent questions. Please call the main number to the clinic [phone] and follow the prompts.  For any non-urgent questions, you may also contact your provider using MyChart. We now offer e-Visits for anyone 6418 and older to request care online for non-urgent symptoms. For details visit [email or web address].   Also download the MyChart app! Go to the app store, search "MyChart", open the app, select [place], and log in with your MyChart username and password.  Atezolizumab Injection What is this medication? ATEZOLIZUMAB (a te zoe LIZ ue mab) treats some types of cancer. It works by helping your immune system slow or stop the spread of cancer cells. It is a monoclonal antibody. This medicine may be used for other purposes; ask your health care provider or pharmacist if you have questions. COMMON BRAND NAME(S): Tecentriq What should I tell my care team before I take this medication? They need to know if you have any of these conditions: Allogeneic stem cell transplant (uses someone else's stem cells) Autoimmune diseases, such as Crohn disease, ulcerative colitis, lupus History of chest radiation Nervous system problems, such as Guillain-Barre syndrome, myasthenia  gravis Organ transplant An unusual or allergic reaction to atezolizumab, other medications, foods, dyes, or preservatives Pregnant or trying to get pregnant Breast-feeding How should I use this medication? This medication is injected into a vein. It  is given by your care team in a hospital or clinic setting. A special MedGuide will be given to you before each treatment. Be sure to read this information carefully each time. Talk to your care team about the use of this medication in children. While it may be prescribed for children as young as 2 years for selected conditions, precautions do apply. Overdosage: If you think you have taken too much of this medicine contact a poison control center or emergency room at once. NOTE: This medicine is only for you. Do not share this medicine with others. What if I miss a dose? Keep appointments for follow-up doses. It is important not to miss your dose. Call your care team if you are unable to keep an appointment. What may interact with this medication? Interactions have not been studied. This list may not describe all possible interactions. Give your health care provider a list of all the medicines, herbs, non-prescription drugs, or dietary supplements you use. Also tell them if you smoke, drink alcohol, or use illegal drugs. Some items may interact with your medicine. What should I watch for while using this medication? Your condition will be monitored carefully while you are receiving this medication. You may need blood work while taking this medication. This medication may cause serious skin reactions. They can happen weeks to months after starting the medication. Contact your care team right away if you notice fevers or flu-like symptoms with a rash. The rash may be red or purple and then turn into blisters or peeling of the skin. You may also notice a red rash with swelling of the face, lips, or lymph nodes in your neck or under your arms. Tell your care team right away if you have any change in your eyesight. Talk to your care team if you may be pregnant. Serious birth defects can occur if you take this medication during pregnancy and for 5 months after the last dose. You will need a negative  pregnancy test before starting this medication. Contraception is recommended while taking this medication and for 5 months after the last dose. Your care team can help you find the option that works for you. Do not breastfeed while taking this medication and for at least 5 months after the last dose. What side effects may I notice from receiving this medication? Side effects that you should report to your doctor or health care professional as soon as possible: Allergic reactions--skin rash, itching, hives, swelling of the face, lips, tongue, or throat Dry cough, shortness of breath or trouble breathing Eye pain, redness, irritation, or discharge with blurry or decreased vision Heart muscle inflammation--unusual weakness or fatigue, shortness of breath, chest pain, fast or irregular heartbeat, dizziness, swelling of the ankles, feet, or hands Hormone gland problems--headache, sensitivity to light, unusual weakness or fatigue, dizziness, fast or irregular heartbeat, increased sensitivity to cold or heat, excessive sweating, constipation, hair loss, increased thirst or amount of urine, tremors or shaking, irritability Infusion reactions--chest pain, shortness of breath or trouble breathing, feeling faint or lightheaded Kidney injury (glomerulonephritis)--decrease in the amount of urine, red or dark brown urine, foamy or bubbly urine, swelling of the ankles, hands, or feet Liver injury--right upper belly pain, loss of appetite, nausea, light-colored stool, dark yellow or brown urine,  yellowing skin or eyes, unusual weakness or fatigue Pain, tingling, or numbness in the hands or feet, muscle weakness, change in vision, confusion or trouble speaking, loss of balance or coordination, trouble walking, seizures Rash, fever, and swollen lymph nodes Redness, blistering, peeling, or loosening of the skin, including inside the mouth Sudden or severe stomach pain, bloody diarrhea, fever, nausea, vomiting Side  effects that usually do not require medical attention (report to your doctor or health care professional if they continue or are bothersome): Bone, joint, or muscle pain Diarrhea Fatigue Loss of appetite Nausea Skin rash This list may not describe all possible side effects. Call your doctor for medical advice about side effects. You may report side effects to FDA at 1-800-FDA-1088. Where should I keep my medication? This medication is given in a hospital or clinic. It will not be stored at home. NOTE: This sheet is a summary. It may not cover all possible information. If you have questions about this medicine, talk to your doctor, pharmacist, or health care provider.  2023 Elsevier/Gold Standard (2021-05-15 00:00:00)

## 2022-05-16 NOTE — Progress Notes (Signed)
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  She has been referred to [place] for findings of bone metastases and her recent MRI.  She has a prior history of 3 packs/day day smoking for over 45 years and quit smoking 5 years ago.She had a CT chest abdomen pelvis in 2018 which showed a 5 mm lung nodule in the left lower lobe.  Recently over the last 2 months patient has been having worsening back pain and was referred to orthopedics.  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  Pathologic fracture of S1.   PET scan showed 2 RUL lung nodules, hilar and mediastinal adenopathy and widespread bone mets. MRI brain negative.   Patient completed palliative RT to her spine. Bronchoscopy showed small cell lung cancer.  Palliative carboplatin, etoposide and Tecentriq started on 08/18/2018. Scans after 4 cycles showed stable disease. 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  She reports being compliant but her values  fluctuate widely.    Interval history-patient has baseline dementia and confusion.  She has not had any recent falls.  ECOG PS- 1 Pain scale- 0   Review of systems- Review of Systems  Constitutional:  Negative for chills, fever, malaise/fatigue and weight loss.  HENT:  Negative for congestion, ear discharge and nosebleeds.   Eyes:  Negative for blurred vision.  Respiratory:  Negative for cough, hemoptysis, sputum production, shortness of breath and wheezing.   Cardiovascular:  Negative for chest pain, palpitations, orthopnea and claudication.  Gastrointestinal:  Negative for abdominal pain, blood in stool, constipation, diarrhea, heartburn, melena, nausea and vomiting.  Genitourinary:  Negative for dysuria, flank pain, frequency, hematuria and urgency.  Musculoskeletal:  Negative for back pain, joint pain and myalgias.  Skin:  Negative for rash.  Neurological:  Negative for dizziness, tingling, focal weakness, seizures, weakness and headaches.  Endo/Heme/Allergies:  Does not bruise/bleed easily.  Psychiatric/Behavioral:  Negative for depression and suicidal ideas. The patient does not have insomnia.       Allergies  Allergen Reactions   Bee Venom Swelling     Past Medical History:  Diagnosis Date   Arthritis    Cirrhosis    Dementia    Depression    GERD (gastroesophageal reflux disease)    Hyperlipidemia    Hypertension    Lung cancer    Metastatic bone cancer      Past Surgical History:  Procedure Laterality Date   APPENDECTOMY  1971   CHOLECYSTECTOMY  1971   COLONOSCOPY WITH PROPOFOL N/A 11/15/2016   Procedure: COLONOSCOPY WITH PROPOFOL;  Surgeon: Wyline Mood, MD;  Location: [facility]  ENDOSCOPY;  Service: Gastroenterology;  Laterality: N/A;   ENDOBRONCHIAL ULTRASOUND Right 07/30/2018   Procedure: ENDOBRONCHIAL ULTRASOUND;  Surgeon: Shane Crutch, MD;  Location: [facility] ORS;  Service: Pulmonary;  Laterality: Right;   ESOPHAGOGASTRODUODENOSCOPY (EGD) WITH PROPOFOL  N/A 01/07/2017   Procedure: ESOPHAGOGASTRODUODENOSCOPY (EGD) WITH PROPOFOL;  Surgeon: Wyline Mood, MD;  Location: [facility] ENDOSCOPY;  Service: Gastroenterology;  Laterality: N/A;   LAPAROSCOPY N/A 03/01/2017   Procedure: LAPAROSCOPY DIAGNOSTIC;  Surgeon: Earline Mayotte, MD;  Location: [facility] ORS;  Service: General;  Laterality: N/A;   PORTA CATH INSERTION N/A 08/14/2018   Procedure: PORTA CATH INSERTION;  Surgeon: Annice Needy, MD;  Location: [facility] INVASIVE CV LAB;  Service: Cardiovascular;  Laterality: N/A;   TONSILECTOMY, ADENOIDECTOMY, BILATERAL MYRINGOTOMY AND TUBES  1955   TONSILLECTOMY     VENTRAL HERNIA REPAIR N/A 03/01/2017   10 x 14 CM Ventralight ST mesh, intraperitoneal location.    VENTRAL HERNIA REPAIR N/A 03/01/2017   Procedure: HERNIA REPAIR VENTRAL ADULT;  Surgeon: Earline Mayotte, MD;  Location: [facility] ORS;  Service: General;  Laterality: N/A;    Social History   Socioeconomic History   Marital status: Married    Spouse name: Johnny   Number of children: Not on file   Years of education: Not on file   Highest education level: Not on file  Occupational History   Not on file  Tobacco Use   Smoking status: Former    Packs/day: 2.00    Years: 50.00    Additional pack years: 0.00    Total pack years: 100.00    Types: Cigarettes, E-cigarettes    Quit date: 02/07/2012    Years since quitting: 10.2   Smokeless tobacco: Never  Vaping Use   Vaping Use: Former   Start date: 10/06/2013   Devices: uses no liquid  Substance and Sexual Activity   Alcohol use: No    Alcohol/week: 0.0 standard drinks of alcohol   Drug use: No   Sexual activity: Yes  Other Topics Concern   Not on file  Social History Narrative   Married   Retired   Engineer, manufacturing systems level of education    No children    1 cup of coffee   Social Determinants of Health   Financial Resource Strain: Low Risk  (09/08/2020)   Overall Financial Resource Strain (CARDIA)    Difficulty of Paying Living Expenses: Not  hard at all  Food Insecurity: No Food Insecurity (04/20/2022)   Hunger Vital Sign    Worried About Running Out of Food in the Last Year: Never true    Ran Out of Food in the Last Year: Never true  Transportation Needs: Unmet Transportation Needs (04/20/2022)   PRAPARE - Transportation    Lack of Transportation (Medical): No    Lack of Transportation (Non-Medical): Yes  Physical Activity: Unknown (10/29/2018)   Exercise Vital Sign    Days of Exercise per Week: Patient declined    Minutes of Exercise per Session: Patient declined  Stress: No Stress Concern Present (09/08/2020)   [employer] of Occupational Health - Occupational Stress Questionnaire    Feeling of Stress : Not at all  Social Connections: Unknown (09/08/2020)   Social Connection and Isolation Panel [NHANES]    Frequency of Communication with Friends and Family: More than three times a week    Frequency of Social Gatherings with Friends and Family: Not on file    Attends Religious Services: Not on file    Active Member of Clubs or  Organizations: Not on file    Attends Club or Organization Meetings: Not on file    Marital Status: Married  Intimate Partner Violence: Not At Risk (09/08/2020)   Humiliation, Afraid, Rape, and Kick questionnaire    Fear of Current or Ex-Partner: No    Emotionally Abused: No    Physically Abused: No    Sexually Abused: No    Family History  Problem Relation Age of Onset   Hypertension Mother    Ovarian cancer Mother 46   Dementia Mother    Heart disease Father    Stroke Father    Seizures Father    Ovarian cancer Sister        ? dx cancer had hyst.   Breast cancer Neg Hx      Current Outpatient Medications:    amLODipine (NORVASC) 5 MG tablet, Take 1 tablet (5 mg total) by mouth daily., Disp: 90 tablet, Rfl: 3   buPROPion (WELLBUTRIN XL) 300 MG 24 hr tablet, Take 1 tablet by mouth once daily, Disp: 90 tablet, Rfl: 3   divalproex (DEPAKOTE) 250 MG DR tablet, Take 1 tablet (250 mg  total) by mouth 2 (two) times daily., Disp: 60 tablet, Rfl: 11   ibuprofen (ADVIL,MOTRIN) 200 MG tablet, Take 400 mg by mouth every 8 (eight) hours as needed for headache or moderate pain., Disp: , Rfl:    levothyroxine (SYNTHROID) 125 MCG tablet, Take 1 tablet (125 mcg total) by mouth daily before breakfast. Take the pill first thing in the morning. Do not take other pills at the same time., Disp: 30 tablet, Rfl: 3   memantine (NAMENDA) 10 MG tablet, Take 1 tablet (10 mg total) by mouth 2 (two) times daily., Disp: 60 tablet, Rfl: 11   Multiple Vitamin (MULTIVITAMIN) tablet, Take 1 tablet by mouth daily., Disp: , Rfl:    Omega-3 Fatty Acids (FISH OIL) 300 MG CAPS, Take 1 capsule by mouth daily., Disp: , Rfl:    ondansetron (ZOFRAN) 4 MG tablet, Take 1 tablet (4 mg total) by mouth every 6 (six) hours., Disp: 12 tablet, Rfl: 0   pantoprazole (PROTONIX) 40 MG tablet, TAKE 1 TABLET BY MOUTH TWICE DAILY BEFORE A MEAL, Disp: 180 tablet, Rfl: 0   potassium chloride SA (KLOR-CON M) 20 MEQ tablet, Take 1 tablet by mouth once daily, Disp: 14 tablet, Rfl: 0   QUEtiapine (SEROQUEL) 25 MG tablet, Take 0.5 tablets (12.5 mg total) by mouth 2 (two) times daily., Disp: 30 tablet, Rfl: 11   rivastigmine (EXELON) 3 MG capsule, Take 1 capsule (3 mg total) by mouth 2 (two) times daily., Disp: 60 capsule, Rfl: 11   rosuvastatin (CRESTOR) 20 MG tablet, Take 1 tablet by mouth once daily, Disp: 90 tablet, Rfl: 0   sertraline (ZOLOFT) 100 MG tablet, Take 2 tablets (200 mg total) by mouth daily., Disp: 180 tablet, Rfl: 1 No current facility-administered medications for this visit.  Facility-Administered Medications Ordered in Other Visits:    denosumab (XGEVA) injection 120 mg, 120 mg, Subcutaneous, Q30 days, Creig Hines, MD, 120 mg at 03/05/19 1525   heparin lock flush 100 UNIT/ML injection, , , ,   Physical exam:  Vitals:   05/16/22 1343  BP: 114/66  Pulse: 78  Resp: 18  Temp: (!) 96.2 F (35.7 C)  TempSrc:  Tympanic  SpO2: 95%  Weight: 151 lb 4.8 oz (68.6 kg)  Height: 5\' 5"  (1.651 m)   Physical Exam Cardiovascular:     Rate and Rhythm: Normal rate and  regular rhythm.     Heart sounds: Normal heart sounds.  Pulmonary:     Effort: Pulmonary effort is normal.     Breath sounds: Normal breath sounds.  Abdominal:     General: Bowel sounds are normal.     Palpations: Abdomen is soft.  Skin:    General: Skin is warm and dry.  Neurological:     General: No focal deficit present.     Mental Status: She is alert.         Latest Ref Rng & Units 05/16/2022    1:31 PM  CMP  Glucose 70 - 99 mg/dL 90   BUN 8 - 23 mg/dL 15   Creatinine 4.400.44 - 1.00 mg/dL 3.471.13   Sodium 425135 - 956145 mmol/L 139   Potassium 3.5 - 5.1 mmol/L 3.8   Chloride 98 - 111 mmol/L 103   CO2 22 - 32 mmol/L 27   Calcium 8.9 - 10.3 mg/dL 8.8   Total Protein 6.5 - 8.1 g/dL 7.1   Total Bilirubin 0.3 - 1.2 mg/dL 0.4   Alkaline Phos 38 - 126 U/L 87   AST 15 - 41 U/L 33   ALT 0 - 44 U/L 26       Latest Ref Rng & Units 05/16/2022    1:31 PM  CBC  WBC 4.0 - 10.5 K/uL 5.5   Hemoglobin 12.0 - 15.0 g/dL 38.713.6   Hematocrit 56.436.0 - 46.0 % 43.3   Platelets 150 - 400 K/uL 151      Assessment and plan- Patient is a 72 y.o. 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  She is here for on treatment assessment prior to cycle 57 of palliative Tecentriq  Counts okay to proceed with cycle 57 of palliative Tecentriq today.  I am going to hold future treatments for now and I am planning to get CT chest abdomen and pelvis with contrast and bone scan in about 2 weeks.  If scans show stable disease I am planning to monitor her off immunotherapy and active surveillance.  Autoimmune hypothyroidism: She is on levothyroxine but compliance is questionable   Visit Diagnosis 1. Encounter for antineoplastic immunotherapy   2. Small cell lung cancer, right upper lobe   3. Autoimmune hypothyroidism      Dr. Owens SharkArchana Kadyn Chovan,  MD, MPH [facility] [phone] 05/16/2022 4:22 PM

## 2022-05-17 ENCOUNTER — Telehealth: Payer: PPO | Admitting: Hospice and Palliative Medicine

## 2022-05-18 ENCOUNTER — Telehealth: Payer: PPO | Admitting: Hospice and Palliative Medicine

## 2022-05-21 ENCOUNTER — Inpatient Hospital Stay (HOSPITAL_BASED_OUTPATIENT_CLINIC_OR_DEPARTMENT_OTHER): Payer: PPO | Admitting: Hospice and Palliative Medicine

## 2022-05-21 DIAGNOSIS — C3411 Malignant neoplasm of upper lobe, right bronchus or lung: Secondary | ICD-10-CM

## 2022-05-21 NOTE — Progress Notes (Signed)
No response to call. Will reschedule.

## 2022-05-23 ENCOUNTER — Ambulatory Visit (INDEPENDENT_AMBULATORY_CARE_PROVIDER_SITE_OTHER): Payer: PPO | Admitting: Family Medicine

## 2022-05-23 ENCOUNTER — Encounter: Payer: Self-pay | Admitting: Family Medicine

## 2022-05-23 VITALS — BP 114/66 | HR 76 | Temp 98.3°F | Ht 65.0 in | Wt 151.8 lb

## 2022-05-23 DIAGNOSIS — F332 Major depressive disorder, recurrent severe without psychotic features: Secondary | ICD-10-CM | POA: Diagnosis not present

## 2022-05-23 DIAGNOSIS — R3915 Urgency of urination: Secondary | ICD-10-CM | POA: Diagnosis not present

## 2022-05-23 DIAGNOSIS — E032 Hypothyroidism due to medicaments and other exogenous substances: Secondary | ICD-10-CM

## 2022-05-23 DIAGNOSIS — F03A18 Unspecified dementia, mild, with other behavioral disturbance: Secondary | ICD-10-CM

## 2022-05-23 LAB — POC URINALSYSI DIPSTICK (AUTOMATED)
Glucose, UA: NEGATIVE
Ketones, UA: POSITIVE
Nitrite, UA: POSITIVE
Protein, UA: POSITIVE — AB
Spec Grav, UA: >=1.03 — AB (ref 1.010–1.025)
Urobilinogen, UA: 1 E.U./dL
pH, UA: 6 (ref 5.0–8.0)

## 2022-05-23 MED ORDER — ROSUVASTATIN CALCIUM 20 MG PO TABS: 20.0000 mg | ORAL_TABLET | Freq: Every day | ORAL | 3 refills | Status: DC

## 2022-05-23 MED ORDER — CEPHALEXIN 500 MG PO CAPS: 500.0000 mg | ORAL_CAPSULE | Freq: Four times a day (QID) | ORAL | 0 refills | Status: DC

## 2022-05-23 NOTE — Assessment & Plan Note (Signed)
Chronic issue.  Check TSH to see if her thyroid medication could be contributing to her tremor.  She will continue Synthroid 125 mcg daily.

## 2022-05-23 NOTE — Progress Notes (Signed)
[name], MD Phone: [phone]  Anne [name] is a 72 y.o. female who presents today for f/u.  Dementia: worse recently. Saw neuro recently for follow-up.  They left her medicines the same with the exception of having her take Seroquel twice a day.  She has follow-up with neurology in October.  Had some hallucinations.  The patient's husband is not sure if the increased dose of Seroquel is help with those though the Seroquel has helped her calm down quite a bit.  Husband reports the patient put 2 sticks on the stove and left and there.  He notes he took them off before progressed anything more than a little smoke.  Depression: Patient is currently on Wellbutrin and Zoloft.  She continues to feel depressed though does note she is doing relatively okay at this time.  This time a year is better for her.  No SI.  Urinary frequency: Recent onset.  She notes urgency.  No dysuria.  Tremor: Her husband notes this has gotten a little worse than in the past.  Social History   Tobacco Use  Smoking Status Former   Packs/day: 2.00   Years: 50.00   Additional pack years: 0.00   Total pack years: 100.00   Types: Cigarettes, E-cigarettes   Quit date: 02/07/2012   Years since quitting: 10.2  Smokeless Tobacco Never    Current Outpatient Medications on File Prior to Visit  Medication Sig Dispense Refill   amLODipine (NORVASC) 5 MG tablet Take 1 tablet (5 mg total) by mouth daily. 90 tablet 3   buPROPion (WELLBUTRIN XL) 300 MG 24 hr tablet Take 1 tablet by mouth once daily 90 tablet 3   divalproex (DEPAKOTE) 250 MG DR tablet Take 1 tablet (250 mg total) by mouth 2 (two) times daily. 60 tablet 11   ibuprofen (ADVIL,MOTRIN) 200 MG tablet Take 400 mg by mouth every 8 (eight) hours as needed for headache or moderate pain.     levothyroxine (SYNTHROID) 125 MCG tablet Take 1 tablet (125 mcg total) by mouth daily before breakfast. Take the pill first thing in the morning. Do not take other pills at  the same time. 30 tablet 3   memantine (NAMENDA) 10 MG tablet Take 1 tablet (10 mg total) by mouth 2 (two) times daily. 60 tablet 11   Multiple Vitamin (MULTIVITAMIN) tablet Take 1 tablet by mouth daily.     Omega-3 Fatty Acids (FISH OIL) 300 MG CAPS Take 1 capsule by mouth daily.     ondansetron (ZOFRAN) 4 MG tablet Take 1 tablet (4 mg total) by mouth every 6 (six) hours. 12 tablet 0   pantoprazole (PROTONIX) 40 MG tablet TAKE 1 TABLET BY MOUTH TWICE DAILY BEFORE A MEAL 180 tablet 0   potassium chloride SA (KLOR-CON M) 20 MEQ tablet Take 1 tablet by mouth once daily 14 tablet 0   QUEtiapine (SEROQUEL) 25 MG tablet Take 0.5 tablets (12.5 mg total) by mouth 2 (two) times daily. 30 tablet 11   rivastigmine (EXELON) 3 MG capsule Take 1 capsule (3 mg total) by mouth 2 (two) times daily. 60 capsule 11   sertraline (ZOLOFT) 100 MG tablet Take 2 tablets (200 mg total) by mouth daily. 180 tablet 1   Current Facility-Administered Medications on File Prior to Visit  Medication Dose Route Frequency Provider Last Rate Last Admin   denosumab (XGEVA) injection 120 mg  120 mg Subcutaneous Q30 days Creig Hines, MD   120 mg at 03/05/19 1525   heparin  lock flush 100 UNIT/ML injection              ROS see history of present illness  Objective  Physical Exam Vitals:   05/23/22 1434  BP: 114/66  Pulse: 76  Temp: 98.3 F (36.8 C)  SpO2: 96%    BP Readings from Last 3 Encounters:  05/23/22 114/66  05/16/22 114/66  05/09/22 (!) 93/57   Wt Readings from Last 3 Encounters:  05/23/22 151 lb 12.8 oz (68.9 kg)  05/16/22 151 lb 4.8 oz (68.6 kg)  05/09/22 157 lb (71.2 kg)    Physical Exam Constitutional:      General: She is not in acute distress.    Appearance: She is not diaphoretic.  Cardiovascular:     Rate and Rhythm: Normal rate and regular rhythm.     Heart sounds: Normal heart sounds.  Pulmonary:     Effort: Pulmonary effort is normal.     Breath sounds: Normal breath sounds.   Skin:    General: Skin is warm and dry.  Neurological:     Mental Status: She is alert.     Comments: Mild tremor noted bilateral hands      Assessment/Plan: Please see individual problem list.  Mild dementia with other behavioral disturbance, unspecified dementia type Assessment & Plan: Chronic issue.  Discussed that her memory would likely progressively worsen with time and it is just a matter how quickly.  She will continue her medications prescribed by her neurologist.  I had a discussion with her husband regarding the potential need to place her in a memory care unit if she continues to do things at home that compromise her and others safety.  I encouraged him to unplug the stove or cut off the power to the stove.  I advised that he needs to pay very close attention to the patient to ensure that she is not doing anything that is a safety concern.  The patient's husband voiced understanding.   Hypothyroidism due to medication Assessment & Plan: Chronic issue.  Check TSH to see if her thyroid medication could be contributing to her tremor.  She will continue Synthroid 125 mcg daily.  Orders: -     TSH  Severe episode of recurrent major depressive disorder, without psychotic features Assessment & Plan: Chronic issue.  Somewhat improved.  She notes no SI.  She will continue Zoloft 200 mg daily and Wellbutrin 300 mg daily.   Urinary urgency Assessment & Plan: Patient with urgency and frequency.  Urinalysis concerning for UTI.  Keflex 500 mg 4 times daily sent to the pharmacy.  Dosing is based off of kidney impairment data from up-to-date.  Patient did not provide enough urine to send for culture or microscopy.  Orders: -     POCT Urinalysis Dipstick (Automated) -     Cephalexin; Take 1 capsule (500 mg total) by mouth 4 (four) times daily.  Dispense: 28 capsule; Refill: 0  Other orders -     Rosuvastatin Calcium; Take 1 tablet (20 mg total) by mouth daily.  Dispense: 90 tablet;  Refill: 3    Return in about 3 months (around 08/22/2022).   [name], MD [facility]

## 2022-05-23 NOTE — Assessment & Plan Note (Addendum)
Patient with urgency and frequency.  Urinalysis concerning for UTI.  Keflex 500 mg 4 times daily sent to the pharmacy.  Dosing is based off of kidney impairment data from up-to-date.  Patient did not provide enough urine to send for culture or microscopy.

## 2022-05-23 NOTE — Patient Instructions (Addendum)
Nice to see you. Please turn the power off to the cystoscopy for unplug list of. I would encourage you to consider a memory care unit. We will check some lab work today and contact you with results. Please bring back your urine sample.

## 2022-05-23 NOTE — Assessment & Plan Note (Signed)
Chronic issue.  Somewhat improved.  She notes no SI.  She will continue Zoloft 200 mg daily and Wellbutrin 300 mg daily.

## 2022-05-23 NOTE — Addendum Note (Signed)
Addended by: Warden Fillers on: 05/23/2022 03:17 PM   Modules accepted: Orders

## 2022-05-23 NOTE — Assessment & Plan Note (Signed)
Chronic issue.  Discussed that her memory would likely progressively worsen with time and it is just a matter how quickly.  She will continue her medications prescribed by her neurologist.  I had a discussion with her husband regarding the potential need to place her in a memory care unit if she continues to do things at home that compromise her and others safety.  I encouraged him to unplug the stove or cut off the power to the stove.  I advised that he needs to pay very close attention to the patient to ensure that she is not doing anything that is a safety concern.  The patient's husband voiced understanding.

## 2022-05-30 ENCOUNTER — Ambulatory Visit
Admission: RE | Admit: 2022-05-30 | Discharge: 2022-05-30 | Disposition: A | Discharge status: Home / Self Care | Payer: PPO | Source: Ambulatory Visit | Attending: Oncology | Admitting: Oncology

## 2022-05-30 DIAGNOSIS — K7460 Unspecified cirrhosis of liver: Secondary | ICD-10-CM | POA: Diagnosis not present

## 2022-05-30 DIAGNOSIS — C3411 Malignant neoplasm of upper lobe, right bronchus or lung: Secondary | ICD-10-CM | POA: Insufficient documentation

## 2022-05-30 DIAGNOSIS — C7951 Secondary malignant neoplasm of bone: Secondary | ICD-10-CM | POA: Diagnosis not present

## 2022-05-30 MED ORDER — IOHEXOL 300 MG/ML  SOLN
100.0000 mL | Freq: Once | INTRAMUSCULAR | Status: AC | PRN
  Administered 2022-05-30: 100 mL via INTRAVENOUS

## 2022-06-01 ENCOUNTER — Ambulatory Visit: Payer: Self-pay

## 2022-06-01 NOTE — Patient Outreach (Signed)
  Care Coordination   Follow Up Visit Note   06/01/2022 Name: Anne [name] MRN: [id number] DOB: 1950/06/26  Anne [name] is a 72 y.o. year old female who sees Anne [name], Anne Mao, MD for primary care. I spoke with  Tiffany Kocher by phone today. Initially attempted to speak with spouse, Dylan Ruotolo. Mr. Hendricksen advised RNCM to speak with patient.   What matters to the patients health and wellness today?  Patient states she is doing ok today. She denies any pain or increase in symptoms. Patient confirms recently seeing her oncologist, neurologist, and primary care provider. Denies any changes in treatment plan. Patient denies any falls.  Unable to obtain much information due to patient being unable to/ unwilling to provide much medical history.  Spouse advised RNCM to talk with patient today.     Goals Addressed             This Visit's Progress    Management of dementia and depression/ lung cancer       Interventions Today    Flowsheet Row Most Recent Value  Chronic Disease   Chronic disease during today's visit Other  [Dementia/ depression, lung cancer with metastasis]  General Interventions   General Interventions Discussed/Reviewed General Interventions Reviewed, Doctor Visits  [evaluation of current treatment plan for depression/ dementia and patients adherence to plan as established by provider. . Assessed for pain/ symptoms related to lung ca.]  Doctor Visits Discussed/Reviewed Doctor Visits Reviewed  [reviewed scheduled/ upcoming provider visits. Discussed/ reviewed most recent oncology/ neurology visits.]  Education Interventions   Education Provided Provided Education  Provided Verbal Education On When to see the doctor  [Advised to notify provider for increased pain/ symptoms / falls]  Mental Health Interventions   Mental Health Discussed/Reviewed Mental Health Reviewed, Depression  [assessed for increase in depression symptoms.]  Pharmacy Interventions   Pharmacy  Dicussed/Reviewed Pharmacy Topics Reviewed  [medications reviewed and compliance discussed.  Stressed importance of taking medications as prescribed. Assessed for medication changes]  Safety Interventions   Safety Discussed/Reviewed Fall Risk  [Assessed for recent falls.]              SDOH assessments and interventions completed:  No     Care Coordination Interventions:  Yes, provided   Follow up plan: Follow up call scheduled for 07/06/22    Encounter Outcome:  Pt. Visit Completed   George Ina RN,BSN,CCM [facility] Care Coordination [phone] direct line

## 2022-06-04 ENCOUNTER — Other Ambulatory Visit: Payer: Self-pay | Admitting: Family Medicine

## 2022-06-04 DIAGNOSIS — R3915 Urgency of urination: Secondary | ICD-10-CM

## 2022-06-04 NOTE — Telephone Encounter (Signed)
Please call and find out why they requested a refill on Keflex.  This is an acute medication for UTIs.  Thanks.

## 2022-06-05 NOTE — Telephone Encounter (Signed)
Please see the prior message on this patient.

## 2022-06-11 ENCOUNTER — Inpatient Hospital Stay: Payer: PPO | Attending: Oncology | Admitting: Oncology

## 2022-06-11 ENCOUNTER — Encounter: Payer: Self-pay | Admitting: Oncology

## 2022-06-11 ENCOUNTER — Other Ambulatory Visit: Payer: Self-pay | Admitting: *Deleted

## 2022-06-11 VITALS — BP 129/76 | HR 77 | Temp 95.9°F | Resp 18 | Ht 65.0 in | Wt 156.0 lb

## 2022-06-11 DIAGNOSIS — Z87891 Personal history of nicotine dependence: Secondary | ICD-10-CM | POA: Insufficient documentation

## 2022-06-11 DIAGNOSIS — Z7189 Other specified counseling: Secondary | ICD-10-CM

## 2022-06-11 DIAGNOSIS — Z79890 Hormone replacement therapy (postmenopausal): Secondary | ICD-10-CM | POA: Insufficient documentation

## 2022-06-11 DIAGNOSIS — C3411 Malignant neoplasm of upper lobe, right bronchus or lung: Secondary | ICD-10-CM

## 2022-06-11 DIAGNOSIS — C7951 Secondary malignant neoplasm of bone: Secondary | ICD-10-CM | POA: Insufficient documentation

## 2022-06-11 DIAGNOSIS — E063 Autoimmune thyroiditis: Secondary | ICD-10-CM | POA: Diagnosis not present

## 2022-06-11 DIAGNOSIS — Z9221 Personal history of antineoplastic chemotherapy: Secondary | ICD-10-CM | POA: Insufficient documentation

## 2022-06-11 DIAGNOSIS — F0393 Unspecified dementia, unspecified severity, with mood disturbance: Secondary | ICD-10-CM | POA: Insufficient documentation

## 2022-06-11 DIAGNOSIS — Z8041 Family history of malignant neoplasm of ovary: Secondary | ICD-10-CM | POA: Insufficient documentation

## 2022-06-11 NOTE — Progress Notes (Signed)
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  She has been referred to [place] for findings of bone metastases and her recent MRI.  She has a prior history of 3 packs/day day smoking for over 45 years and quit smoking 5 years ago.She had a CT chest abdomen pelvis in 2018 which showed a 5 mm lung nodule in the left lower lobe.  Recently over the last 2 months patient has been having worsening back pain and was referred to orthopedics.  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  Pathologic fracture of S1.   PET scan showed 2 RUL lung nodules, hilar and mediastinal adenopathy and widespread bone mets. MRI brain negative.   Patient completed palliative RT to her spine. Bronchoscopy showed small cell lung cancer.  Palliative carboplatin, etoposide and Tecentriq started on 08/18/2018. Scans after 4 cycles showed stable disease. 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  She reports being compliant but her values fluctuate  widely.    Interval history-she remains confused at baseline.  Denies any specific complaints today  ECOG PS- 1 Pain scale- 0   Review of systems- Review of Systems  Constitutional:  Negative for chills, fever, malaise/fatigue and weight loss.  HENT:  Negative for congestion, ear discharge and nosebleeds.   Eyes:  Negative for blurred vision.  Respiratory:  Negative for cough, hemoptysis, sputum production, shortness of breath and wheezing.   Cardiovascular:  Negative for chest pain, palpitations, orthopnea and claudication.  Gastrointestinal:  Negative for abdominal pain, blood in stool, constipation, diarrhea, heartburn, melena, nausea and vomiting.  Genitourinary:  Negative for dysuria, flank pain, frequency, hematuria and urgency.  Musculoskeletal:  Negative for back pain, joint pain and myalgias.  Skin:  Negative for rash.  Neurological:  Negative for dizziness, tingling, focal weakness, seizures, weakness and headaches.  Endo/Heme/Allergies:  Does not bruise/bleed easily.  Psychiatric/Behavioral:  Negative for depression and suicidal ideas. The patient does not have insomnia.       Allergies  Allergen Reactions   Bee Venom Swelling     Past Medical History:  Diagnosis Date   Arthritis    Cirrhosis (HCC)    Dementia (HCC)    Depression    GERD (gastroesophageal reflux disease)    Hyperlipidemia    Hypertension    Lung cancer (HCC)    Metastatic bone cancer      Past Surgical History:  Procedure Laterality Date   APPENDECTOMY  1971   CHOLECYSTECTOMY  1971   COLONOSCOPY WITH PROPOFOL N/A 11/15/2016   Procedure: COLONOSCOPY WITH PROPOFOL;  Surgeon: Wyline Mood, MD;  Location: [facility] ENDOSCOPY;  Service: Gastroenterology;  Laterality: N/A;   ENDOBRONCHIAL ULTRASOUND Right 07/30/2018   Procedure: ENDOBRONCHIAL ULTRASOUND;  Surgeon: Shane Crutch, MD;  Location: [facility] ORS;  Service: Pulmonary;  Laterality: Right;   ESOPHAGOGASTRODUODENOSCOPY (EGD) WITH PROPOFOL  N/A 01/07/2017   Procedure: ESOPHAGOGASTRODUODENOSCOPY (EGD) WITH PROPOFOL;  Surgeon: Wyline Mood, MD;  Location[facility] ENDOSCOPY;  Service: Gastroenterology;  Laterality: N/A;   LAPAROSCOPY N/A 03/01/2017   Procedure: LAPAROSCOPY DIAGNOSTIC;  Surgeon: Earline Mayotte, MD;  Location: [facility] ORS;  Service: General;  Laterality: N/A;   PORTA CATH INSERTION N/A 08/14/2018   Procedure: PORTA CATH INSERTION;  Surgeon: Annice Needy, MD;  Location: [facility] INVASIVE CV LAB;  Service: Cardiovascular;  Laterality: N/A;   TONSILECTOMY, ADENOIDECTOMY, BILATERAL MYRINGOTOMY AND TUBES  1955   TONSILLECTOMY     VENTRAL HERNIA REPAIR N/A 03/01/2017   10 x 14 CM Ventralight ST mesh, intraperitoneal location.    VENTRAL HERNIA REPAIR N/A 03/01/2017   Procedure: HERNIA REPAIR VENTRAL ADULT;  Surgeon: Earline Mayotte, MD;  Location: [facility] ORS;  Service: General;  Laterality: N/A;    Social History   Socioeconomic History   Marital status: Married    Spouse name: Johnny   Number of children: Not on file   Years of education: Not on file   Highest education level: Not on file  Occupational History   Not on file  Tobacco Use   Smoking status: Former    Packs/day: 2.00    Years: 50.00    Additional pack years: 0.00    Total pack years: 100.00    Types: Cigarettes, E-cigarettes    Quit date: 02/07/2012    Years since quitting: 10.3   Smokeless tobacco: Never  Vaping Use   Vaping Use: Former   Start date: 10/06/2013   Devices: uses no liquid  Substance and Sexual Activity   Alcohol use: No    Alcohol/week: 0.0 standard drinks of alcohol   Drug use: No   Sexual activity: Yes  Other Topics Concern   Not on file  Social History Narrative   Married   Retired   Engineer, manufacturing systems level of education    No children    1 cup of coffee   Social Determinants of Health   Financial Resource Strain: Low Risk  (09/08/2020)   Overall Financial Resource Strain (CARDIA)    Difficulty of Paying Living Expenses: Not  hard at all  Food Insecurity: No Food Insecurity (04/20/2022)   Hunger Vital Sign    Worried About Running Out of Food in the Last Year: Never true    Ran Out of Food in the Last Year: Never true  Transportation Needs: Unmet Transportation Needs (04/20/2022)   PRAPARE - Transportation    Lack of Transportation (Medical): No    Lack of Transportation (Non-Medical): Yes  Physical Activity: Unknown (10/29/2018)   Exercise Vital Sign    Days of Exercise per Week: Patient declined    Minutes of Exercise per Session: Patient declined  Stress: No Stress Concern Present (09/08/2020)   [employer] of Occupational Health - Occupational Stress Questionnaire    Feeling of Stress : Not at all  Social Connections: Unknown (09/08/2020)   Social Connection and Isolation Panel [NHANES]    Frequency of Communication with Friends and Family: More than three times a week    Frequency of Social Gatherings with Friends and Family: Not on file    Attends Religious Services: Not on file    Active Member of Clubs or Organizations: Not  on file    Attends Club or Organization Meetings: Not on file    Marital Status: Married  Intimate Partner Violence: Not At Risk (09/08/2020)   Humiliation, Afraid, Rape, and Kick questionnaire    Fear of Current or Ex-Partner: No    Emotionally Abused: No    Physically Abused: No    Sexually Abused: No    Family History  Problem Relation Age of Onset   Hypertension Mother    Ovarian cancer Mother 34   Dementia Mother    Heart disease Father    Stroke Father    Seizures Father    Ovarian cancer Sister        ? dx cancer had hyst.   Breast cancer Neg Hx      Current Outpatient Medications:    amLODipine (NORVASC) 5 MG tablet, Take 1 tablet (5 mg total) by mouth daily., Disp: 90 tablet, Rfl: 3   buPROPion (WELLBUTRIN XL) 300 MG 24 hr tablet, Take 1 tablet by mouth once daily, Disp: 90 tablet, Rfl: 3   cephALEXin (KEFLEX) 500 MG capsule, Take 1 capsule (500 mg total)  by mouth 4 (four) times daily., Disp: 28 capsule, Rfl: 0   divalproex (DEPAKOTE) 250 MG DR tablet, Take 1 tablet (250 mg total) by mouth 2 (two) times daily., Disp: 60 tablet, Rfl: 11   ibuprofen (ADVIL,MOTRIN) 200 MG tablet, Take 400 mg by mouth every 8 (eight) hours as needed for headache or moderate pain., Disp: , Rfl:    levothyroxine (SYNTHROID) 125 MCG tablet, Take 1 tablet (125 mcg total) by mouth daily before breakfast. Take the pill first thing in the morning. Do not take other pills at the same time., Disp: 30 tablet, Rfl: 3   memantine (NAMENDA) 10 MG tablet, Take 1 tablet (10 mg total) by mouth 2 (two) times daily., Disp: 60 tablet, Rfl: 11   Multiple Vitamin (MULTIVITAMIN) tablet, Take 1 tablet by mouth daily., Disp: , Rfl:    Omega-3 Fatty Acids (FISH OIL) 300 MG CAPS, Take 1 capsule by mouth daily., Disp: , Rfl:    ondansetron (ZOFRAN) 4 MG tablet, Take 1 tablet (4 mg total) by mouth every 6 (six) hours., Disp: 12 tablet, Rfl: 0   pantoprazole (PROTONIX) 40 MG tablet, TAKE 1 TABLET BY MOUTH TWICE DAILY BEFORE A MEAL, Disp: 180 tablet, Rfl: 0   potassium chloride SA (KLOR-CON M) 20 MEQ tablet, Take 1 tablet by mouth once daily, Disp: 14 tablet, Rfl: 0   QUEtiapine (SEROQUEL) 25 MG tablet, Take 0.5 tablets (12.5 mg total) by mouth 2 (two) times daily., Disp: 30 tablet, Rfl: 11   rivastigmine (EXELON) 3 MG capsule, Take 1 capsule (3 mg total) by mouth 2 (two) times daily., Disp: 60 capsule, Rfl: 11   rosuvastatin (CRESTOR) 20 MG tablet, Take 1 tablet (20 mg total) by mouth daily., Disp: 90 tablet, Rfl: 3   sertraline (ZOLOFT) 100 MG tablet, Take 2 tablets (200 mg total) by mouth daily., Disp: 180 tablet, Rfl: 1 No current facility-administered medications for this visit.  Facility-Administered Medications Ordered in Other Visits:    denosumab (XGEVA) injection 120 mg, 120 mg, Subcutaneous, Q30 days, Creig Hines, MD, 120 mg at 03/05/19 1525   heparin lock flush 100 UNIT/ML injection,  , , ,   Physical exam: There were no vitals filed for this visit. Physical Exam Cardiovascular:     Rate and Rhythm: Normal rate and regular rhythm.     Heart sounds: Normal heart sounds.  Pulmonary:     Effort: Pulmonary effort is normal.     Breath sounds: Normal breath sounds.  Abdominal:     General: Bowel sounds are normal.     Palpations: Abdomen is soft.  Skin:    General: Skin is warm and dry.  Neurological:     Mental Status: She is alert.     Comments: Oriented to self and place         Latest Ref Rng & Units 05/16/2022    1:31 PM  CMP  Glucose 70 - 99 mg/dL 90   BUN 8 - 23 mg/dL 15   Creatinine 1.61 - 1.00 mg/dL 0.96   Sodium 045 - 409 mmol/L 139   Potassium 3.5 - 5.1 mmol/L 3.8   Chloride 98 - 111 mmol/L 103   CO2 22 - 32 mmol/L 27   Calcium 8.9 - 10.3 mg/dL 8.8   Total Protein 6.5 - 8.1 g/dL 7.1   Total Bilirubin 0.3 - 1.2 mg/dL 0.4   Alkaline Phos 38 - 126 U/L 87   AST 15 - 41 U/L 33   ALT 0 - 44 U/L 26       Latest Ref Rng & Units 05/16/2022    1:31 PM  CBC  WBC 4.0 - 10.5 K/uL 5.5   Hemoglobin 12.0 - 15.0 g/dL 81.1   Hematocrit 91.4 - 46.0 % 43.3   Platelets 150 - 400 K/uL 151     No images are attached to the encounter.  CT CHEST ABDOMEN PELVIS W CONTRAST  Result Date: 06/01/2022 CLINICAL DATA:  Small-cell lung cancer right upper lobe. On immunotherapy. * Tracking Code: BO * EXAM: CT CHEST, ABDOMEN, AND PELVIS WITH CONTRAST TECHNIQUE: Multidetector CT imaging of the chest, abdomen and pelvis was performed following the standard protocol during bolus administration of intravenous contrast. RADIATION DOSE REDUCTION: This exam was performed according to the departmental dose-optimization program which includes automated exposure control, adjustment of the mA and/or kV according to patient size and/or use of iterative reconstruction technique. CONTRAST:  OMNIPAQUE IOHEXOL 300 MG/ML  SOLN COMPARISON:  01/01/2022 FINDINGS: CT CHEST FINDINGS  Cardiovascular: Right Port-A-Cath tip superior caval/atrial junction. Aortic atherosclerosis. Normal heart size, without pericardial effusion. Lipomatous hypertrophy of the intra-atrial septum. Lad and right coronary artery calcification. No central pulmonary embolism, on this non-dedicated study. Mediastinum/Nodes: No supraclavicular adenopathy. No mediastinal or hilar adenopathy. Lungs/Pleura: No pleural fluid.  Mild centrilobular emphysema. Scattered bilateral pulmonary nodules of maximally 3 mm are similar including on 36/4 in the left upper lobe, 44/4 in the posterior right upper lobe, and 59/4 the anterior right upper lobe. No new or enlarging nodules identified. Musculoskeletal: Sclerosis and vertebral body height loss at T2 is similar. Bilateral nonacute rib fractures. CT ABDOMEN PELVIS FINDINGS Hepatobiliary: Advanced cirrhosis. No focal liver lesion. Cholecystectomy, without biliary ductal dilatation. Pancreas: Normal, without mass or ductal dilatation. Spleen: Normal in size, without focal abnormality. Adrenals/Urinary Tract: Normal right adrenal gland. Mild left adrenal thickening. Bilateral too small to characterize renal lesions . In the absence of clinically indicated signs/symptoms require(s) no independent follow-up. No hydronephrosis. Normal urinary bladder. Stomach/Bowel: Normal stomach, without wall thickening. Colonic stool burden suggests constipation. Underdistended transverse colon. Normal terminal ileum. Normal small bowel. Vascular/Lymphatic: Separate origins of the splenic and common hepatic arteries. Aortic atherosclerosis. Patent portal and splenic veins without portal venous hypertension. No abdominopelvic adenopathy. Reproductive: Normal uterus and adnexa. Other: No significant free fluid. No evidence of omental or peritoneal disease. Musculoskeletal: Grade 1 anterolisthesis  of L4 on L5. Sclerosis and compression of the S1 vertebral body. Moderate L2 compression deformity. Mild  superior endplate compression deformity at L1. T12-L1 degenerative disc disease. IMPRESSION: 1. Similar osseous metastasis. No findings of extraosseous metastatic disease. 2. Similar tiny bilateral pulmonary nodules, favoring a benign etiology. 3. Cirrhosis 4. Aortic atherosclerosis (ICD10-I70.0), coronary artery atherosclerosis and emphysema (ICD10-J43.9). 5.  Possible constipation. Electronically Signed   By: Jeronimo Greaves M.D.   On: 06/01/2022 12:03     Assessment and plan- Patient is a 72 y.o. 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  I have reviewed CT chest abdomen and pelvis images independently and discussed findings with the patient which shows stable areas of treated bone mets.  No evidence of recurrent or progressive disease.  I plan is therefore to monitor her off Tecentriq at this time.  I will plan to see her back in 4 months with scans prior.  Autoimmune hypothyroidism: Continue levothyroxine 125 mcg.  Will repeat thyroid function test again in 4 months   Visit Diagnosis 1. Goals of care, counseling/discussion   2. Small cell lung cancer, right upper lobe (HCC)   3. Autoimmune hypothyroidism      Dr. Owens Shark, MD, MPH [facility] at [facility] [phone] 06/11/2022 2:16 PM

## 2022-06-11 NOTE — Addendum Note (Signed)
Addended by: Corene Cornea on: 06/11/2022 04:27 PM   Modules accepted: Orders

## 2022-06-19 NOTE — Progress Notes (Unsigned)
Psychiatric Initial Adult Assessment   Patient Identification: Anne [name] MRN:  [id number] Date of Evaluation:  06/21/2022 Referral Source: Glori Luis, MD  Chief Complaint:   Chief Complaint  Patient presents with   Establish Care   Visit Diagnosis:    ICD-10-CM   1. Moderate episode of recurrent major depressive disorder (HCC)  F33.1     2. Mild neurocognitive disorder  G31.84       History of Present Illness:   Anne [name] is a 72 y.o. year old female with a history of depression, mild dementia, SCC lung cancer with bone metastasis, cirrhosis of the liver, autoimmune hypothyroidism , hypertension hyperlipidemia obesity , who is referred for mild dementia, depression.   According to the chart review, the patient was seen by Dr. Smith Robert, oncologist. 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  I have reviewed CT chest abdomen and pelvis images independently and discussed findings with the patient which shows stable areas of treated bone mets.  No evidence of recurrent or progressive disease.  I plan is therefore to monitor her off Tecentriq at this time.  I will plan to see her back in 4 months with scans prior."  - The patient was seen by neurologist 02/2022. MOCA 12 02/2022, 16 on 08/2021. Quetiapine was uptitrated to 12.5 mg BID  Anne [name] is a poor historian, she often asks her significant others for answers, and is not able to elaborate the story.   Anne [name], her significant other presents to the visit with the patient consent.  He states that she was combative when this appointment was made.  She has come down for the past few weeks.  He Beaty she had UTI around that time.  She was very her to get along with. She has depression ("seasonal depression") prior to the diagnosis of cancer.  She voiced passive SI in the past.  Although there are knives in the kitchen, he denies any concern.  He is also concerned  that her memory is gone.  Although she was mad at him, she forgot why she was mad. She tells that her daughter is 76 year old, although her grandson is 34 year old. He cannot let her alone, and helps her at home.  They do not have any home services. He takes care of her medication. No wondering/insomnia/appetite loss.  Anne [name] states that she has been doing so-so.  She spends most of the time watching TV.  She agrees that she feels depressed. When she is asked how long she has been feeling this way, she answered that she feels depressed about everything.  She states that her depression hit her when she was diagnosed with lung cancer.  Although she wanted this to go away, it would never go away. The patient has mood symptoms as in PHQ-9/GAD-7.  Although she occasionally had SI with plan of overdosing pills, she denies any intent (Anne [name] agreed to secure pills so that she does not have access to those).  She denies HI.  She had VH of seeing a person a few weeks ago.  She denies AH.  She denies paranoia.   Seizure- she reportedly had some seizure several months ago (details unknown). The caregiver is unsure whether she is on any medication for this.  Orientation- "October (later stated Feb when asked the day of the week), Tues, Hospital, 2022"  Medication- sertraline 150 mg daily, bupropion 300 mg, Namenda 10  mg bid, depakote 250 mg bid, quetiapine 12.5 mg daily, Memantine 10 mg rivastigmine 3 mg daily  Substance use  Tobacco Alcohol Other substances/  Current Vape E cigarette denies denies  Past  denies denies  Past Treatment         Wt Readings from Last 3 Encounters:  06/21/22 155 lb 9.6 oz (70.6 kg)  06/11/22 156 lb (70.8 kg)  05/23/22 151 lb 12.8 oz (68.9 kg)     Household: significant other of 18 years Marital status: widow Number of children: 2 children (fair relationship) 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 He does not know the details) Past trials of medication:  History of violence: denies History of head injury: denies  Previous Psychotropic Medications: Yes   Substance Abuse History in the last 12 months:  No.  Consequences of Substance Abuse: NA  Past Medical History:  Past Medical History:  Diagnosis Date   Arthritis    Cirrhosis (HCC)    Dementia (HCC)    Depression    GERD (gastroesophageal reflux disease)    Hyperlipidemia    Hypertension    Lung cancer (HCC)    Metastatic bone cancer     Past Surgical History:  Procedure Laterality Date   APPENDECTOMY  1971   CHOLECYSTECTOMY  1971   COLONOSCOPY WITH PROPOFOL N/A 11/15/2016   Procedure: COLONOSCOPY WITH PROPOFOL;  Surgeon: Wyline Mood, MD;  Location: [facility] ENDOSCOPY;  Service: Gastroenterology;  Laterality: N/A;   ENDOBRONCHIAL ULTRASOUND Right 07/30/2018   Procedure: ENDOBRONCHIAL ULTRASOUND;  Surgeon: Shane Crutch, MD;  Location: [facility] ORS;  Service: Pulmonary;  Laterality: Right;   ESOPHAGOGASTRODUODENOSCOPY (EGD) WITH PROPOFOL N/A 01/07/2017   Procedure: ESOPHAGOGASTRODUODENOSCOPY (EGD) WITH PROPOFOL;  Surgeon: Wyline Mood, MD;  Location: [facility] ENDOSCOPY;  Service: Gastroenterology;  Laterality: N/A;   LAPAROSCOPY N/A 03/01/2017   Procedure: LAPAROSCOPY DIAGNOSTIC;  Surgeon: Earline Mayotte, MD;  Location: [facility] ORS;  Service: General;  Laterality: N/A;   PORTA CATH INSERTION N/A 08/14/2018   Procedure: PORTA CATH INSERTION;  Surgeon: Annice Needy, MD;  Location: [facility] INVASIVE CV LAB;   Service: Cardiovascular;  Laterality: N/A;   TONSILECTOMY, ADENOIDECTOMY, BILATERAL MYRINGOTOMY AND TUBES  1955   TONSILLECTOMY     VENTRAL HERNIA REPAIR N/A 03/01/2017   10 x 14 CM Ventralight ST mesh, intraperitoneal location.    VENTRAL HERNIA REPAIR N/A 03/01/2017   Procedure: HERNIA REPAIR VENTRAL ADULT;  Surgeon: Earline Mayotte, MD;  Location: [facility] ORS;  Service: General;  Laterality: N/A;    Family Psychiatric History: as below  Family History:  Family History  Problem Relation Age of Onset   Hypertension Mother    Ovarian cancer Mother 60   Dementia Mother    Heart disease Father    Stroke Father    Seizures Father    Ovarian cancer Sister        ? dx cancer had hyst.   Breast cancer Neg Hx     Social History:   Social History   Socioeconomic History   Marital status: Divorced  Spouse name: Not on file   Number of children: 2   Years of education: Not on file   Highest education level: High school graduate  Occupational History   Not on file  Tobacco Use   Smoking status: Former    Packs/day: 2.00    Years: 50.00    Additional pack years: 0.00    Total pack years: 100.00    Types: Cigarettes, E-cigarettes    Quit date: 02/07/2012    Years since quitting: 10.3   Smokeless tobacco: Never  Vaping Use   Vaping Use: Former   Start date: 10/06/2013   Devices: uses no liquid  Substance and Sexual Activity   Alcohol use: No    Alcohol/week: 0.0 standard drinks of alcohol   Drug use: No   Sexual activity: Yes  Other Topics Concern   Not on file  Social History Narrative   Married   Retired   Engineer, manufacturing systems level of education    No children    1 cup of coffee   Social Determinants of Health   Financial Resource Strain: Low Risk  (09/08/2020)   Overall Financial Resource Strain (CARDIA)    Difficulty of Paying Living Expenses: Not hard at all  Food Insecurity: No Food Insecurity (04/20/2022)   Hunger Vital Sign    Worried About Running Out of Food  in the Last Year: Never true    Ran Out of Food in the Last Year: Never true  Transportation Needs: Unmet Transportation Needs (04/20/2022)   PRAPARE - Transportation    Lack of Transportation (Medical): No    Lack of Transportation (Non-Medical): Yes  Physical Activity: Unknown (10/29/2018)   Exercise Vital Sign    Days of Exercise per Week: Patient declined    Minutes of Exercise per Session: Patient declined  Stress: No Stress Concern Present (09/08/2020)   [employer] of Occupational Health - Occupational Stress Questionnaire    Feeling of Stress : Not at all  Social Connections: Unknown (09/08/2020)   Social Connection and Isolation Panel [NHANES]    Frequency of Communication with Friends and Family: More than three times a week    Frequency of Social Gatherings with Friends and Family: Not on file    Attends Religious Services: Not on file    Active Member of Clubs or Organizations: Not on file    Attends Banker Meetings: Not on file    Marital Status: Married    Additional Social History: as above  Allergies:   Allergies  Allergen Reactions   Bee Venom Swelling    Metabolic Disorder Labs: Lab Results  Component Value Date   HGBA1C 6.1 09/12/2016   No results found for: "PROLACTIN" Lab Results  Component Value Date   CHOL 177 08/24/2020   TRIG 198.0 (H) 08/24/2020   HDL 54.90 08/24/2020   CHOLHDL 3 08/24/2020   VLDL 39.6 08/24/2020   LDLCALC 82 08/24/2020   LDLCALC 106 (H) 05/06/2017   Lab Results  Component Value Date   TSH 0.459 04/04/2022    Therapeutic Level Labs: No results found for: "LITHIUM" No results found for: "CBMZ" Lab Results  Component Value Date   VALPROATE 28 (L) 04/05/2022    Current Medications: Current Outpatient Medications  Medication Sig Dispense Refill   amLODipine (NORVASC) 5 MG tablet Take 1 tablet (5 mg total) by mouth daily. 90 tablet 3   buPROPion (WELLBUTRIN XL) 300 MG 24 hr tablet Take 1 tablet by  mouth once daily 90 tablet  3   divalproex (DEPAKOTE) 250 MG DR tablet Take 1 tablet (250 mg total) by mouth 2 (two) times daily. 60 tablet 11   levothyroxine (SYNTHROID) 125 MCG tablet Take 1 tablet (125 mcg total) by mouth daily before breakfast. Take the pill first thing in the morning. Do not take other pills at the same time. 30 tablet 3   memantine (NAMENDA) 10 MG tablet Take 1 tablet (10 mg total) by mouth 2 (two) times daily. 60 tablet 11   QUEtiapine (SEROQUEL) 25 MG tablet Take 0.5 tablets (12.5 mg total) by mouth 2 (two) times daily. 30 tablet 11   rivastigmine (EXELON) 3 MG capsule Take 1 capsule (3 mg total) by mouth 2 (two) times daily. 60 capsule 11   rosuvastatin (CRESTOR) 20 MG tablet Take 1 tablet (20 mg total) by mouth daily. 90 tablet 3   sertraline (ZOLOFT) 100 MG tablet Take 2 tablets (200 mg total) by mouth daily. 180 tablet 1   Multiple Vitamin (MULTIVITAMIN) tablet Take 1 tablet by mouth daily. (Patient not taking: Reported on 06/21/2022)     No current facility-administered medications for this visit.   Facility-Administered Medications Ordered in Other Visits  Medication Dose Route Frequency Provider Last Rate Last Admin   denosumab (XGEVA) injection 120 mg  120 mg Subcutaneous Q30 days Creig Hines, MD   120 mg at 03/05/19 1525   heparin lock flush 100 UNIT/ML injection             Musculoskeletal: Strength & Muscle Tone: within normal limits Gait & Station: normal Patient leans: N/A  Psychiatric Specialty Exam: Review of Systems  Psychiatric/Behavioral:  Positive for decreased concentration, dysphoric mood, hallucinations and suicidal ideas. Negative for agitation, behavioral problems, confusion, self-injury and sleep disturbance. The patient is nervous/anxious. The patient is not hyperactive.   All other systems reviewed and are negative.   Blood pressure 97/63, pulse 92, temperature 97.7 F (36.5 C), temperature source Oral, height 5\' 5"  (1.651 m), weight  155 lb 9.6 oz (70.6 kg).Body mass index is 25.89 kg/m.  General Appearance: Fairly Groomed  Eye Contact:  Fair  Speech:  Clear and Coherent  Volume:  Normal  Mood:  Depressed  Affect:  Blunt  Thought Process:  Coherent  Orientation:  Other:  oriented to place and person  Thought Content:  Illogical  Suicidal Thoughts:  Yes.  without intent/plan  Homicidal Thoughts:  No  Memory:  Immediate;   Poor  Judgement:  Poor  Insight:  Lacking  Psychomotor Activity:  Normal, Normal tone, no rigidity, no resting, +postural tremors, no tardive dyskinesia  ,   Concentration:  Concentration: Fair and Attention Span: Fair  Recall:  Poor  Fund of Knowledge:Fair  Language: Good  Akathisia:  No  Handed:  Right  AIMS (if indicated):  not done  Assets:  Social Support  ADL's:  Impaired  Cognition: Impaired,  Mild  Sleep:  Good   Screenings: GAD-7    Garment/textile technologist Visit from 06/21/2022 in [facility] Office Visit from 05/23/2022 in [facility] [employer] Visit from 03/19/2022 in [facility] [employer] Visit from 02/08/2022 in [facility] [employer]  Total GAD-7 Score 10 16 16  0      Mini-Mental    Flowsheet Row Office Visit from 01/19/2021 in [name] Office Visit from 03[facility]14/2020 in [facility] [employer]  from 06/04/2017 in [facility] [employer]  Total Score (max 30 points ) 23 25 27       PHQ2-9    Flowsheet Row Office Visit from 06/21/2022 in [facility] Office Visit from 05/23/2022 in [facility] Coordination from 04/20/2022 in [facility] Coord[facility] Office Visit from 03/19/2022 in [facility] [employer] Visit from 02/08/2022 in [facility] [employer]  PHQ-2 Total Score 2 6 3 6  0  PHQ-9 Total Score 12 24 18 18  0      Flowsheet Row Office Visit from 06/21/2022 in [facility] ED from 04/05/2022 in [facility] ED from 03/26/2022 in [facility]  C-SSRS RISK CATEGORY Low Risk No Risk Moderate Risk       Assessment and Plan:  Anne [name] is a 72 y.o. year old female with a history of depression, mild dementia, SCC lung cancer with bone metastasis, cirrhosis of the liver, autoimmune hypothyroidism , hypertension hyperlipidemia obesity , who is referred for mild dementia, depression.   1. Moderate episode of recurrent major depressive disorder (HCC) Acute stressors include:  Other stressors include: SCC lung cancer with bone metastasis    History:   She reports anhedonia, depressive symptoms in the context of stressors as above.  Assessing the severity of her mood symptoms is challenging due to her neurocognitive disorder, which has significantly impaired her ability to engage in the interview.  Will continue sertraline, bupropion, and quetiapine at this time to target depression.   2. Mild neurocognitive disorder (HCC) Functional Status   IADL: Independent in the following:            Requires assistance with the following: managing finances, medications, driving ADL  Independent in the following: bathing and hygiene (needs reminder), feeding, continence, grooming and toileting, walking          Requires assistance with the following: Folate, Vtamin B12, (TSH wnl 03/2022) Images: CT heat wo contrast 03/2022  Brain: No evidence of acute infarction, hemorrhage, hydrocephalus, extra-axial collection or mass lesion/mass effect., 10/2021 No evidence of acute infarction, hemorrhage, hydrocephalus,extra-axial collection or mass lesion/mass effect.  Prominence of the ventricles and sulci secondary to mild cerebral volume loss. Mild chronic microvascular ischemic changes of the white matter. Neuropsych assessment: MOCA 12 02/2022, 16 on 08/2021. Etiology:    Exam is notable for limited insight, and illogical thought process on today's evaluation.  According to the chart review and information from her caregiver, she did have hallucinations, behavior issues, which was likely secondary to neuropsychiatry symptoms.  Given she has been doing better since uptitration of quetiapine, will continue the current dose at this time.  Will judiciously use this medication given its risk of mortality, stroke for people with neurocognitive disorder.  Benefits outweigh risks at this time given it allows her to be adherent to treatment.  She has been followed by her neurologist, and is on Memantine and rivastigmine; this medication will be continued.  Message has sent to inquire the indication of Depakote whether it is for NPS or seizure.  Will consider discontinuation of Depakote at the next visit if there is no concern of seizure.   Plan Continue sertraline 150 mg daily Continue bupropion 300 mg daily Continue quetiapine 12.5 mg twice a day (EKG Qtc 436 msec, HR 77 03/2022) Next appointment:  7/23 at 1 pm for 30 mins, IP Sent a neurologist regarding the indication of depakote Obtain labs at the next visit (vitamin B12, folate) - on namenda 10 mg bid, Memantine 10 mg daily, rivastigmine 3 mg daily, depakote 250 mg bid (Vpa 28, LFT wnl 03/2022)    The patient demonstrates the following risk factors for suicide: Chronic risk factors for suicide include: psychiatric disorder of depression . Acute risk factors for suicide include: unemployment. Protective factors for this patient include: positive social support. Considering these factors, the overall suicide risk at this point appears to be low. Patient is appropriate for outpatient follow up.  Although there are guns at  home, she does not know where it is.  Her significant other feels comfortable to have knives in the kitchen.  Discussed emergency resources if any worsening in SI.  Collaboration of Care: Other reviewed notes in Epic  Patient/Guardian was advised Release of Information must be obtained prior to any record release in order to collaborate their care with an outside provider. Patient/Guardian was advised if they have not already done so to contact the registration department to sign all necessary forms in order for [place] to release information regarding their care.   Consent: Patient/Guardian gives verbal consent for treatment and assignment of benefits for services provided during this visit. Patient/Guardian expressed understanding and agreed to proceed.   Neysa Hotter, MD 5/16/202412:06 PM

## 2022-06-21 ENCOUNTER — Ambulatory Visit: Payer: PPO | Admitting: Psychiatry

## 2022-06-21 ENCOUNTER — Encounter: Payer: Self-pay | Admitting: Psychiatry

## 2022-06-21 VITALS — BP 97/63 | HR 92 | Temp 97.7°F | Ht 65.0 in | Wt 155.6 lb

## 2022-06-21 DIAGNOSIS — F331 Major depressive disorder, recurrent, moderate: Secondary | ICD-10-CM

## 2022-06-21 DIAGNOSIS — G3184 Mild cognitive impairment, so stated: Secondary | ICD-10-CM | POA: Diagnosis not present

## 2022-06-28 ENCOUNTER — Other Ambulatory Visit: Payer: Self-pay | Admitting: *Deleted

## 2022-06-28 MED ORDER — LEVOTHYROXINE SODIUM 125 MCG PO TABS: 125.0000 ug | ORAL_TABLET | Freq: Every day | ORAL | 3 refills | Status: DC

## 2022-07-03 DIAGNOSIS — Z961 Presence of intraocular lens: Secondary | ICD-10-CM | POA: Diagnosis not present

## 2022-07-03 DIAGNOSIS — H353211 Exudative age-related macular degeneration, right eye, with active choroidal neovascularization: Secondary | ICD-10-CM | POA: Diagnosis not present

## 2022-07-03 DIAGNOSIS — H353131 Nonexudative age-related macular degeneration, bilateral, early dry stage: Secondary | ICD-10-CM | POA: Diagnosis not present

## 2022-08-09 ENCOUNTER — Other Ambulatory Visit: Payer: Self-pay | Admitting: Family Medicine

## 2022-08-09 DIAGNOSIS — I10 Essential (primary) hypertension: Secondary | ICD-10-CM

## 2022-08-10 ENCOUNTER — Other Ambulatory Visit: Payer: Self-pay | Admitting: Family Medicine

## 2022-08-10 DIAGNOSIS — F331 Major depressive disorder, recurrent, moderate: Secondary | ICD-10-CM

## 2022-08-13 ENCOUNTER — Ambulatory Visit: Payer: PPO | Admitting: Neurology

## 2022-08-16 NOTE — Progress Notes (Signed)
BH MD/PA/NP OP Progress Note  08/28/2022 5:03 PM Anne [name]  MRN:  [id number]  Chief Complaint:  Chief Complaint  Patient presents with   Follow-up   HPI:  This is a follow-up appointment for depression and neurocognitive disorder.  According to the chart review, session was up titrated by Dr. Birdie Sons about a week ago.   At the beginning of the interview, Anne [name] was initially trying to help her filling the screening sheet. He ended up raising the voice, and gave to the patient to fill it on her own.  He states that her memory is gone.  She is looking for a dog who is dead.  She states that her mother is there.  He wishes that she would listen to him. She is paranoid that he is lying to her. She said that she has another house in addition to the one they live in (when she was asked about this, she states that "neighbor was going solid black. My parents sold the house, and I moved in [place].")  Although he is aware of her SI, he denies any safety concern.  He acknowledges the petition process if needed.  Although he has a pistol, it is unloaded, and she does not know where it is.  He takes care of her medication, and there is no concern of her stoking her medication.     Anne [name] is a limited historian.  He occasionally provides incoherent story. (She also has hearing loss).  She states that she "don't know" when she was asked about her mood, although she says "yeah" to feeling depressed. She states that she does not remember things, and tries not to remember.  Although she reports passive SI, she denies any attempt over the past several months.  Although she states that it is "possible" of suicide attempt, she denies any intent or plans. She spends time, doing the puzzle. She hopes that "this (pointing her head)" would go away. She states that there is "craziness" in her head, referring to her father side of mental illness. She denies Hi, hallucinations.   At the end of the interview, she  was interviewed by herself.  When she was asked if she has any concern about interaction with Anne [name], she denies any mistreatment. She states that he has been trying to help her, and she is the one who is "crazy." She is reassured that her current condition is not insanity, although her concern is acknowledged and validated. Provided psycho education in regards to the treatment. She agrees with the plans as below.   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 Moderate episode of recurrent major depressive disorder (HCC)  F33.1     2. Major neurocognitive disorder (HCC)  F03.90       Past Psychiatric History: Please see initial evaluation for full details. I have reviewed the history. No updates at this time.     Past Medical History:  Past Medical History:  Diagnosis Date   Arthritis    Cirrhosis (HCC)    Dementia (HCC)    Depression    GERD (gastroesophageal reflux disease)  Hyperlipidemia    Hypertension    Lung cancer (HCC)    Metastatic bone cancer     Past Surgical History:  Procedure Laterality Date   APPENDECTOMY  1971   CHOLECYSTECTOMY  1971   COLONOSCOPY WITH PROPOFOL N/A 11/15/2016   Procedure: COLONOSCOPY WITH PROPOFOL;  Surgeon: Wyline Mood, MD;  Location: [facility] ENDOSCOPY;  Service: Gastroenterology;  Laterality: N/A;   ENDOBRONCHIAL ULTRASOUND Right 07/30/2018   Procedure: ENDOBRONCHIAL ULTRASOUND;  Surgeon: Shane Crutch, MD;  Location: [facility] ORS;  Service: Pulmonary;  Laterality: Right;   ESOPHAGOGASTRODUODENOSCOPY (EGD) WITH PROPOFOL N/A 01/07/2017   Procedure: ESOPHAGOGASTRODUODENOSCOPY (EGD) WITH PROPOFOL;   Surgeon: Wyline Mood, MD;  Location: [facility] ENDOSCOPY;  Service: Gastroenterology;  Laterality: N/A;   LAPAROSCOPY N/A 03/01/2017   Procedure: LAPAROSCOPY DIAGNOSTIC;  Surgeon: Earline Mayotte, MD;  Location: [facility] ORS;  Service: General;  Laterality: N/A;   PORTA CATH INSERTION N/A 08/14/2018   Procedure: PORTA CATH INSERTION;  Surgeon: Annice Needy, MD;  Location: [facility] INVASIVE CV LAB;  Service: Cardiovascular;  Laterality: N/A;   TONSILECTOMY, ADENOIDECTOMY, BILATERAL MYRINGOTOMY AND TUBES  1955   TONSILLECTOMY     VENTRAL HERNIA REPAIR N/A 03/01/2017   10 x 14 CM Ventralight ST mesh, intraperitoneal location.    VENTRAL HERNIA REPAIR N/A 03/01/2017   Procedure: HERNIA REPAIR VENTRAL ADULT;  Surgeon: Earline Mayotte, MD;  Location: [facility] ORS;  Service: General;  Laterality: N/A;    Family Psychiatric History: Please see initial evaluation for full details. I have reviewed the history. No updates at this time.    Family History:  Family History  Problem Relation Age of Onset   Hypertension Mother    Ovarian cancer Mother 96   Dementia Mother    Heart disease Father    Stroke Father    Seizures Father    Ovarian cancer Sister        ? dx cancer had hyst.   Breast cancer Neg Hx     Social History:  Social History   Socioeconomic History   Marital status: Divorced    Spouse name: Not on file   Number of children: 2   Years of education: Not on file   Highest education level: High school graduate  Occupational History   Not on file  Tobacco Use   Smoking status: Former    Current packs/day: 0.00    Average packs/day: 2.0 packs/day for 50.0 years (100.0 ttl pk-yrs)    Types: Cigarettes, E-cigarettes    Start date: 02/06/1962    Quit date: 02/07/2012    Years since quitting: 10.5   Smokeless tobacco: Never  Vaping Use   Vaping status: Former   Start date: 10/06/2013   Devices: uses no liquid  Substance and Sexual Activity   Alcohol use: No    Alcohol/week: 0.0 standard  drinks of alcohol   Drug use: No   Sexual activity: Yes  Other Topics Concern   Not on file  Social History Narrative   Married   Retired   Engineer, manufacturing systems level of education    No children    1 cup of coffee   Social Determinants of Health   Financial Resource Strain: Low Risk  (09/08/2020)   Overall Financial Resource Strain (CARDIA)    Difficulty of Paying Living Expenses: Not hard at all  Food Insecurity: No Food Insecurity (04/20/2022)   Hunger Vital Sign    Worried About Running Out of Food in the Last Year: Never true  Ran Out of Food in the Last Year: Never true  Transportation Needs: Unmet Transportation Needs (04/20/2022)   PRAPARE - Transportation    Lack of Transportation (Medical): No    Lack of Transportation (Non-Medical): Yes  Physical Activity: Unknown (10/29/2018)   Exercise Vital Sign    Days of Exercise per Week: Patient declined    Minutes of Exercise per Session: Patient declined  Stress: No Stress Concern Present (09/08/2020)   [employer] of Occupational Health - Occupational Stress Questionnaire    Feeling of Stress : Not at all  Social Connections: Unknown (09/08/2020)   Social Connection and Isolation Panel [NHANES]    Frequency of Communication with Friends and Family: More than three times a week    Frequency of Social Gatherings with Friends and Family: Not on file    Attends Religious Services: Not on file    Active Member of Clubs or Organizations: Not on file    Attends Banker Meetings: Not on file    Marital Status: Married    Allergies:  Allergies  Allergen Reactions   Bee Venom Swelling    Metabolic Disorder Labs: Lab Results  Component Value Date   HGBA1C 6.1 09/12/2016   No results found for: "PROLACTIN" Lab Results  Component Value Date   CHOL 177 08/24/2020   TRIG 198.0 (H) 08/24/2020   HDL 54.90 08/24/2020   CHOLHDL 3 08/24/2020   VLDL 39.6 08/24/2020   LDLCALC 82 08/24/2020   LDLCALC 106 (H)  05/06/2017   Lab Results  Component Value Date   TSH 0.459 04/04/2022   TSH 6.461 (H) 02/21/2022    Therapeutic Level Labs: No results found for: "LITHIUM" Lab Results  Component Value Date   VALPROATE 28 (L) 04/05/2022   VALPROATE 35 (L) 03/22/2022   No results found for: "CBMZ"  Current Medications: Current Outpatient Medications  Medication Sig Dispense Refill   amLODipine (NORVASC) 5 MG tablet Take 1 tablet by mouth once daily 90 tablet 0   buPROPion (WELLBUTRIN XL) 300 MG 24 hr tablet Take 1 tablet by mouth once daily 90 tablet 0   divalproex (DEPAKOTE) 250 MG DR tablet Take 1 tablet (250 mg total) by mouth 2 (two) times daily. 60 tablet 11   levothyroxine (SYNTHROID) 125 MCG tablet Take 1 tablet (125 mcg total) by mouth daily before breakfast. Take the pill first thing in the morning. Do not take other pills at the same time. 30 tablet 3   memantine (NAMENDA) 10 MG tablet Take 1 tablet (10 mg total) by mouth 2 (two) times daily. 60 tablet 11   Multiple Vitamin (MULTIVITAMIN) tablet Take 1 tablet by mouth daily.     QUEtiapine (SEROQUEL) 25 MG tablet Take 0.5 tablets (12.5 mg total) by mouth 2 (two) times daily. 30 tablet 11   rivastigmine (EXELON) 3 MG capsule Take 1 capsule (3 mg total) by mouth 2 (two) times daily. 60 capsule 11   rosuvastatin (CRESTOR) 20 MG tablet Take 1 tablet (20 mg total) by mouth daily. 90 tablet 3   sertraline (ZOLOFT) 100 MG tablet Take 2 tablets (200 mg total) by mouth daily. 180 tablet 1   No current facility-administered medications for this visit.   Facility-Administered Medications Ordered in Other Visits  Medication Dose Route Frequency Provider Last Rate Last Admin   denosumab (XGEVA) injection 120 mg  120 mg Subcutaneous Q30 days Creig Hines, MD   120 mg at 03/05/19 1525   heparin lock flush 100 UNIT/ML injection  Musculoskeletal: Strength & Muscle Tone: within normal limits Gait & Station: normal Patient leans:  N/A  Psychiatric Specialty Exam: Review of Systems  Psychiatric/Behavioral:  Positive for dysphoric mood and suicidal ideas. Negative for agitation, behavioral problems, confusion, decreased concentration, hallucinations, self-injury and sleep disturbance. The patient is nervous/anxious. The patient is not hyperactive.   All other systems reviewed and are negative.   Blood pressure 125/73, pulse 84, temperature 98 F (36.7 C), temperature source Skin, height 5\' 5"  (1.651 m), weight 156 lb 3.2 oz (70.9 kg).Body mass index is 25.99 kg/m.  General Appearance: Fairly Groomed  Eye Contact:  Good  Speech:  Clear and Coherent  Volume:  Normal  Mood:  Depressed  Affect:  Labile  Thought Process:  Irrelevant  Orientation:  Other:  oriented to self  Thought Content: Illogical   Suicidal Thoughts:  Yes.  without intent/plan  Homicidal Thoughts:  No  Memory:  Immediate;   Good  Judgement:  Fair  Insight:  Lacking  Psychomotor Activity:  Normal  Concentration:  Concentration: Good and Attention Span: Good  Recall:  Poor  Fund of Knowledge: Good  Language: Good  Akathisia:  No  Handed:  Right  AIMS (if indicated): not done  Assets:  Communication Skills Desire for Improvement  ADL's:  Impaired  Cognition: Impaired,  Moderate  Sleep:  Good   Screenings: GAD-7    Flowsheet Row Office Visit from 06/21/2022 in [facility] Office Visit from 05/23/2022 in [facility] Visit from 03/19/2022 in [facility] [employer] Visit from 02/08/2022 in [facility]  Total GAD-7 Score 10 16 16  0      Mini-Mental    Flowsheet Row Office Visit from 01/19/2021 in [name] Office Visit from 03/14/2020 in [facility] [employer] Clinical Support from 06/04/2017 in [facility] [employer]  Total Score (max 30 points ) 23 25 27       [employer]    Flowsheet Row Office Visit from 06/21/2022 in [facility] Office Visit from 05/23/2022 in [facility] Coordination from 04/20/2022 in [facility] Office Visit from 03/19/2022 in [facility] [employer] Visit from 02/08/2022 in [facility] [employer] [facility]  PHQ-2 Total Score 2 6 3 6  0  PHQ-9 Total Score 12 24 18 18  0      Flowsheet Row Office Visit from 06/21/2022 in [facility] ED from 04/05/2022 in [facility] Department at [facility] ED from 03/26/2022 in [facility]  C-SSRS RISK CATEGORY Low Risk No Risk Moderate Risk        Assessment and Plan:  Anne [name] is a 72 y.o. year old female with a history of depression, mild dementia, SCC lung cancer with bone metastasis, cirrhosis of the liver, autoimmune hypothyroidism , hypertension hyperlipidemia obesity , who is referred for mild dementia, depression.   1. Moderate episode of recurrent major depressive disorder (HCC) Acute stressors include:  Other stressors include: SCC lung cancer with bone metastasis    History:   Exam is notable for blunt affect, and she reports depressive symptoms in the context of stressors as above. Assessing the severity of her mood symptoms is challenging due to her neurocognitive disorder,  which has significantly impaired her ability to engage in the interview.  Sertraline was up titrated by her PCP; will continue current dose to see if this makes any difference in her mood.  Will consider getting back on the original dose if no improvement in her symptoms to avoid polypharmacy.  Will continue bupropion along with quetiapine as adjunctive treatment for  depression at this time.   2. Major neurocognitive disorder (HCC) Functional Status   IADL: Independent in the following:            Requires assistance with the following: managing finances, medications, driving ADL  Independent in the following: , feeding, continence, grooming and toileting, walking          Requires assistance with the following:bathing and hygiene (needs reminder) Folate, Vitamin B12, (TSH wnl 03/2022) Images: CT heat wo contrast 03/2022  Brain: No evidence of acute infarction, hemorrhage, hydrocephalus, extra-axial collection or mass lesion/mass effect., 10/2021 No evidence of acute infarction, hemorrhage, hydrocephalus,extra-axial collection or mass lesion/mass effect. Prominence of the ventricles and sulci secondary to mild cerebral volume loss. Mild chronic microvascular ischemic changes of the white matter. Neuropsych assessment: MOCA 12 02/2022, 16 on 08/2021. Etiology:  vascular    Exam is notable for illogical thought process on today's evaluation.  She had a history of hallucinations, behavior issues, which grade secondary to neuropsychiatric symptoms.  Although she may benefit from further uptitration of quetiapine given reported paranoia against the caregiver, will continue the current dose at this time given sertraline was recently uptitrated by PCP. Will judiciously use this medication given its risk of mortality, stroke for people with neurocognitive disorder.  Benefits outweigh risks at this time given it allows her to be adherent to treatment.     Noted that she has been followed by her neurologist, and is on Memantine and rivastigmine along with Depakote for seizure per report. Given the severity of her cognitive impairment, will reach out to her PCP for any available resources including SW consult. Both the caregiver and the patient agrees with this communication.   # caregiver stress  It was noted that her significant other was using negative language/became loud  towards the patient, which was also addressed by the nurse. The issue was discussed with the significant other, who acknowledged his frustration and tendency to raise his voice. He agreed to be more mindful of his mannerisms. Additionally, he received psychoeducation on how to redirect the patient during episodes of paranoia towards the caregiver and when she displays illogical thought processes. Noted that patient denies any safety concern/mistreatment by this caregiver she was interviewed independently.    Plan Continue sertraline 200 mg daily- uptitrated last week by her PCP Continue bupropion 300 mg daily Continue quetiapine 12.5 mg twice a day (EKG Qtc 436 msec, HR 77 03/2022) Next appointment: 9/10 at 1 pm, IP Obtain labs at the next visit (vitamin B12, folate) - on namenda 10 mg bid, Memantine 10 mg daily, rivastigmine 3 mg daily, Depakote 250 mg bid for seizure (Vpa 28, LFT wnl 03/2022)     The patient demonstrates the following risk factors for suicide: Chronic risk factors for suicide include: psychiatric disorder of depression . Acute risk factors for suicide include: unemployment. Protective factors for this patient include: positive social support. Considering these factors, the overall suicide risk at this point appears to be low. Patient is appropriate for outpatient follow up.  Although there are guns at home, she does not know where it is.  Her significant  other feels comfortable to have knives in the kitchen.  Discussed emergency resources if any worsening in SI.  Collaboration of Care: Collaboration of Care: Other reviewed notes in Epic, collaboration with her PCP  Patient/Guardian was advised Release of Information must be obtained prior to any record release in order to collaborate their care with an outside provider. Patient/Guardian was advised if they have not already done so to contact the registration department to sign all necessary forms in order for [place] to release information  regarding their care.   Consent: Patient/Guardian gives verbal consent for treatment and assignment of benefits for services provided during this visit. Patient/Guardian expressed understanding and agreed to proceed.   The duration of the time spent on the following activities on the date of the encounter was 45 minutes.   Preparing to see the patient (e.g., review of test, records)  Obtaining and/or reviewing separately obtained history  Performing a medically necessary exam and/or evaluation  Counseling and educating the patient/family/caregiver  Ordering medications, tests, or procedures  Referring and communicating with other healthcare professionals (when not reported separately)  Documenting clinical information in the electronic or paper health record  Independently interpreting results of tests/labs and communication of results to the family or caregiver  Care coordination (when not reported separately)   Neysa Hotter, MD 08/28/2022, 5:03 PM

## 2022-08-22 ENCOUNTER — Encounter: Payer: Self-pay | Admitting: Family Medicine

## 2022-08-22 ENCOUNTER — Ambulatory Visit: Payer: PPO | Admitting: Family Medicine

## 2022-08-22 VITALS — BP 126/84 | HR 78 | Temp 98.0°F | Ht 65.0 in | Wt 155.0 lb

## 2022-08-22 DIAGNOSIS — F03A18 Unspecified dementia, mild, with other behavioral disturbance: Secondary | ICD-10-CM | POA: Diagnosis not present

## 2022-08-22 DIAGNOSIS — I10 Essential (primary) hypertension: Secondary | ICD-10-CM | POA: Diagnosis not present

## 2022-08-22 DIAGNOSIS — F331 Major depressive disorder, recurrent, moderate: Secondary | ICD-10-CM

## 2022-08-22 DIAGNOSIS — C3411 Malignant neoplasm of upper lobe, right bronchus or lung: Secondary | ICD-10-CM | POA: Diagnosis not present

## 2022-08-22 MED ORDER — SERTRALINE HCL 100 MG PO TABS: 200.0000 mg | ORAL_TABLET | Freq: Every day | ORAL | 1 refills | Status: DC

## 2022-08-22 NOTE — Assessment & Plan Note (Signed)
Patient will continue to follow with her oncologist.

## 2022-08-22 NOTE — Patient Instructions (Signed)
Nice to see you. Please call the psychiatry office to make sure you have an appointment.  The number is [phone]. We are increasing your Zoloft to 200 mg daily.  If you notice any side effects please let us know.

## 2022-08-22 NOTE — Progress Notes (Signed)
[name], MD Phone: [phone]  Anne [name] is a 72 y.o. female who presents today for follow-up.  Depression: Patient reports continued depression.  She reports thinking about death though has no plan or intent to harm herself.  She did see psychiatry and they left her medications as is.  She has been taking Zoloft 150 mg daily and Wellbutrin 300 mg daily.  She is also on Seroquel and Depakote for neuropsychiatric symptoms from dementia.  Hypertension: Not checking blood pressures at home.  No chest pain or shortness of breath.  She is on amlodipine.  Dementia: Patient reports her memory is not great.  She continues on Seroquel, Exelon, Depakote, and Namenda.  Reports she follows up with neurology in the next month or so.  Social History   Tobacco Use  Smoking Status Former   Current packs/day: 0.00   Average packs/day: 2.0 packs/day for 50.0 years (100.0 ttl pk-yrs)   Types: Cigarettes, E-cigarettes   Start date: 02/06/1962   Quit date: 02/07/2012   Years since quitting: 10.5  Smokeless Tobacco Never    Current Outpatient Medications on File Prior to Visit  Medication Sig Dispense Refill   amLODipine (NORVASC) 5 MG tablet Take 1 tablet by mouth once daily 90 tablet 0   buPROPion (WELLBUTRIN XL) 300 MG 24 hr tablet Take 1 tablet by mouth once daily 90 tablet 0   divalproex (DEPAKOTE) 250 MG DR tablet Take 1 tablet (250 mg total) by mouth 2 (two) times daily. 60 tablet 11   levothyroxine (SYNTHROID) 125 MCG tablet Take 1 tablet (125 mcg total) by mouth daily before breakfast. Take the pill first thing in the morning. Do not take other pills at the same time. 30 tablet 3   memantine (NAMENDA) 10 MG tablet Take 1 tablet (10 mg total) by mouth 2 (two) times daily. 60 tablet 11   Multiple Vitamin (MULTIVITAMIN) tablet Take 1 tablet by mouth daily.     QUEtiapine (SEROQUEL) 25 MG tablet Take 0.5 tablets (12.5 mg total) by mouth 2 (two) times daily. 30 tablet 11   rivastigmine  (EXELON) 3 MG capsule Take 1 capsule (3 mg total) by mouth 2 (two) times daily. 60 capsule 11   rosuvastatin (CRESTOR) 20 MG tablet Take 1 tablet (20 mg total) by mouth daily. 90 tablet 3   Current Facility-Administered Medications on File Prior to Visit  Medication Dose Route Frequency Provider Last Rate Last Admin   denosumab (XGEVA) injection 120 mg  120 mg Subcutaneous Q30 days Creig Hines, MD   120 mg at 03/05/19 1525   heparin lock flush 100 UNIT/ML injection              ROS see history of present illness  Objective  Physical Exam Vitals:   08/22/22 1341  BP: 126/84  Pulse: 78  Temp: 98 F (36.7 C)  SpO2: 92%    BP Readings from Last 3 Encounters:  08/22/22 126/84  06/21/22 97/63  06/11/22 129/76   Wt Readings from Last 3 Encounters:  08/22/22 155 lb (70.3 kg)  06/21/22 155 lb 9.6 oz (70.6 kg)  06/11/22 156 lb (70.8 kg)    Physical Exam Constitutional:      General: She is not in acute distress.    Appearance: She is not diaphoretic.  Cardiovascular:     Rate and Rhythm: Normal rate and regular rhythm.     Heart sounds: Normal heart sounds.  Pulmonary:     Effort: Pulmonary effort is normal.  Breath sounds: Normal breath sounds.  Skin:    General: Skin is warm and dry.  Neurological:     Mental Status: She is alert.      Assessment/Plan: Please see individual problem list.  Mild dementia with other behavioral disturbance, unspecified dementia type [facility]) Assessment & Plan: Chronic issue.  Seems to be generally stable.  She will continue to see neurology.   Benign essential HTN Assessment & Plan: Chronic issue.  Adequately controlled for age.  She will continue amlodipine 5 mg daily.   Small cell lung cancer, right upper lobe [facility]) Assessment & Plan: Patient will continue to follow with her oncologist.   Moderate episode of recurrent major depressive disorder [facility]) Assessment & Plan: Chronic issue.  Continues to have symptoms.  We  will increase her Zoloft to 200 mg daily and continue Wellbutrin 300 mg daily.  They will contact her psychiatry office to make sure she has an appointment scheduled.  Orders: -     Sertraline HCl; Take 2 tablets (200 mg total) by mouth daily.  Dispense: 180 tablet; Refill: 1     Return in about 3 months (around 11/22/2022).   [name], MD [facility]

## 2022-08-22 NOTE — Assessment & Plan Note (Signed)
Chronic issue.  Continues to have symptoms.  We will increase her Zoloft to 200 mg daily and continue Wellbutrin 300 mg daily.  They will contact her psychiatry office to make sure she has an appointment scheduled.

## 2022-08-22 NOTE — Assessment & Plan Note (Signed)
Chronic issue.  Seems to be generally stable.  She will continue to see neurology.

## 2022-08-22 NOTE — Assessment & Plan Note (Signed)
Chronic issue.  Adequately controlled for age.  She will continue amlodipine 5 mg daily.

## 2022-08-24 ENCOUNTER — Telehealth: Payer: Self-pay | Admitting: Family Medicine

## 2022-08-24 ENCOUNTER — Other Ambulatory Visit: Payer: Self-pay

## 2022-08-24 DIAGNOSIS — F331 Major depressive disorder, recurrent, moderate: Secondary | ICD-10-CM

## 2022-08-24 MED ORDER — SERTRALINE HCL 100 MG PO TABS: 200.0000 mg | ORAL_TABLET | Freq: Every day | ORAL | 1 refills | Status: DC

## 2022-08-24 NOTE — Telephone Encounter (Signed)
Prescription Request  08/24/2022  LOV: 08/22/2022  What is the name of the medication or equipment? sertraline (ZOLOFT) 100 MG tablet.  Please call patient's husband when refill has been sent in. PATIENT IS OUT OF MEDICATION.   Have you contacted your pharmacy to request a refill? Yes   Which pharmacy would you like this sent to?  [facility] [address], [name] - [address] [name] [address] Phone: [phone] Fax: [phone]    Patient notified that their request is being sent to the clinical staff for review and that they should receive a response within 2 business days.   Please advise at [facility] [phone]

## 2022-08-24 NOTE — Telephone Encounter (Signed)
Prescription sent in to [facility]

## 2022-08-28 ENCOUNTER — Ambulatory Visit: Payer: PPO | Admitting: Psychiatry

## 2022-08-28 ENCOUNTER — Encounter: Payer: Self-pay | Admitting: Psychiatry

## 2022-08-28 VITALS — BP 125/73 | HR 84 | Temp 98.0°F | Ht 65.0 in | Wt 156.2 lb

## 2022-08-28 DIAGNOSIS — F0390 Unspecified dementia without behavioral disturbance: Secondary | ICD-10-CM | POA: Diagnosis not present

## 2022-08-28 DIAGNOSIS — F331 Major depressive disorder, recurrent, moderate: Secondary | ICD-10-CM | POA: Diagnosis not present

## 2022-09-11 ENCOUNTER — Inpatient Hospital Stay: Payer: PPO | Attending: Oncology

## 2022-09-11 DIAGNOSIS — E063 Autoimmune thyroiditis: Secondary | ICD-10-CM

## 2022-09-11 DIAGNOSIS — Z87891 Personal history of nicotine dependence: Secondary | ICD-10-CM | POA: Diagnosis not present

## 2022-09-11 DIAGNOSIS — Z79899 Other long term (current) drug therapy: Secondary | ICD-10-CM | POA: Insufficient documentation

## 2022-09-11 DIAGNOSIS — C7951 Secondary malignant neoplasm of bone: Secondary | ICD-10-CM | POA: Insufficient documentation

## 2022-09-11 DIAGNOSIS — C3411 Malignant neoplasm of upper lobe, right bronchus or lung: Secondary | ICD-10-CM | POA: Diagnosis not present

## 2022-09-11 LAB — CBC WITH DIFFERENTIAL/PLATELET
Abs Immature Granulocytes: 0.01 10*3/uL (ref 0.00–0.07)
Basophils Absolute: 0 10*3/uL (ref 0.0–0.1)
Basophils Relative: 1 %
Eosinophils Absolute: 0.2 10*3/uL (ref 0.0–0.5)
Eosinophils Relative: 5 %
HCT: 39.9 % (ref 36.0–46.0)
Hemoglobin: 12.6 g/dL (ref 12.0–15.0)
Immature Granulocytes: 0 %
Lymphocytes Relative: 19 %
Lymphs Abs: 0.8 10*3/uL (ref 0.7–4.0)
MCH: 28.8 pg (ref 26.0–34.0)
MCHC: 31.6 g/dL (ref 30.0–36.0)
MCV: 91.3 fL (ref 80.0–100.0)
Monocytes Absolute: 0.4 10*3/uL (ref 0.1–1.0)
Monocytes Relative: 9 %
Neutro Abs: 3 10*3/uL (ref 1.7–7.7)
Neutrophils Relative %: 66 %
Platelets: 165 10*3/uL (ref 150–400)
RBC: 4.37 MIL/uL (ref 3.87–5.11)
RDW: 14.8 % (ref 11.5–15.5)
WBC: 4.4 10*3/uL (ref 4.0–10.5)
nRBC: 0 % (ref 0.0–0.2)

## 2022-09-11 LAB — COMPREHENSIVE METABOLIC PANEL
ALT: 43 U/L (ref 0–44)
AST: 53 U/L — ABNORMAL HIGH (ref 15–41)
Albumin: 3 g/dL — ABNORMAL LOW (ref 3.5–5.0)
Alkaline Phosphatase: 103 U/L (ref 38–126)
Anion gap: 9 (ref 5–15)
BUN: 16 mg/dL (ref 8–23)
CO2: 24 mmol/L (ref 22–32)
Calcium: 7.7 mg/dL — ABNORMAL LOW (ref 8.9–10.3)
Chloride: 106 mmol/L (ref 98–111)
Creatinine, Ser: 0.79 mg/dL (ref 0.44–1.00)
GFR, Estimated: >60 mL/min (ref >60)
Glucose, Bld: 82 mg/dL (ref 70–99)
Potassium: 3.2 mmol/L — ABNORMAL LOW (ref 3.5–5.1)
Sodium: 139 mmol/L (ref 135–145)
Total Bilirubin: 0.5 mg/dL (ref 0.3–1.2)
Total Protein: 5.9 g/dL — ABNORMAL LOW (ref 6.5–8.1)

## 2022-09-11 LAB — TSH: TSH: 0.149 u[IU]/mL — ABNORMAL LOW (ref 0.350–4.500)

## 2022-09-11 LAB — T4, FREE: Free T4: 0.96 ng/dL (ref 0.61–1.12)

## 2022-09-11 MED ORDER — SODIUM CHLORIDE 0.9% FLUSH
10.0000 mL | INTRAVENOUS | Status: DC | PRN
  Administered 2022-09-11: 10 mL via INTRAVENOUS
  Filled 2022-09-11: qty 10

## 2022-09-11 MED ORDER — HEPARIN SOD (PORK) LOCK FLUSH 100 UNIT/ML IV SOLN
500.0000 [IU] | Freq: Once | INTRAVENOUS | Status: AC
  Administered 2022-09-11: 500 [IU] via INTRAVENOUS
  Filled 2022-09-11: qty 5

## 2022-10-07 NOTE — Progress Notes (Unsigned)
No show

## 2022-10-10 ENCOUNTER — Ambulatory Visit
Admission: RE | Admit: 2022-10-10 | Discharge: 2022-10-10 | Disposition: A | Discharge status: Home / Self Care | Payer: PPO | Source: Ambulatory Visit | Attending: Oncology | Admitting: Oncology

## 2022-10-10 DIAGNOSIS — E063 Autoimmune thyroiditis: Secondary | ICD-10-CM | POA: Diagnosis not present

## 2022-10-10 DIAGNOSIS — C3411 Malignant neoplasm of upper lobe, right bronchus or lung: Secondary | ICD-10-CM | POA: Diagnosis not present

## 2022-10-10 DIAGNOSIS — K7689 Other specified diseases of liver: Secondary | ICD-10-CM | POA: Diagnosis not present

## 2022-10-10 DIAGNOSIS — I700 Atherosclerosis of aorta: Secondary | ICD-10-CM | POA: Diagnosis not present

## 2022-10-10 DIAGNOSIS — C3490 Malignant neoplasm of unspecified part of unspecified bronchus or lung: Secondary | ICD-10-CM | POA: Diagnosis not present

## 2022-10-10 MED ORDER — IOHEXOL 300 MG/ML  SOLN
80.0000 mL | Freq: Once | INTRAMUSCULAR | Status: AC | PRN
  Administered 2022-10-10: 80 mL via INTRAVENOUS

## 2022-10-14 ENCOUNTER — Other Ambulatory Visit: Payer: Self-pay | Admitting: *Deleted

## 2022-10-14 DIAGNOSIS — C3411 Malignant neoplasm of upper lobe, right bronchus or lung: Secondary | ICD-10-CM

## 2022-10-14 DIAGNOSIS — E063 Autoimmune thyroiditis: Secondary | ICD-10-CM

## 2022-10-15 ENCOUNTER — Inpatient Hospital Stay: Payer: PPO | Attending: Oncology | Admitting: Oncology

## 2022-10-15 ENCOUNTER — Encounter: Payer: Self-pay | Admitting: Oncology

## 2022-10-15 VITALS — BP 134/72 | HR 78 | Temp 96.1°F | Resp 16 | Wt 149.8 lb

## 2022-10-15 DIAGNOSIS — C7951 Secondary malignant neoplasm of bone: Secondary | ICD-10-CM | POA: Insufficient documentation

## 2022-10-15 DIAGNOSIS — Z9221 Personal history of antineoplastic chemotherapy: Secondary | ICD-10-CM | POA: Diagnosis not present

## 2022-10-15 DIAGNOSIS — Z79899 Other long term (current) drug therapy: Secondary | ICD-10-CM | POA: Diagnosis not present

## 2022-10-15 DIAGNOSIS — Z923 Personal history of irradiation: Secondary | ICD-10-CM | POA: Diagnosis not present

## 2022-10-15 DIAGNOSIS — Z8041 Family history of malignant neoplasm of ovary: Secondary | ICD-10-CM | POA: Insufficient documentation

## 2022-10-15 DIAGNOSIS — Z79890 Hormone replacement therapy (postmenopausal): Secondary | ICD-10-CM | POA: Insufficient documentation

## 2022-10-15 DIAGNOSIS — F0393 Unspecified dementia, unspecified severity, with mood disturbance: Secondary | ICD-10-CM | POA: Insufficient documentation

## 2022-10-15 DIAGNOSIS — Z08 Encounter for follow-up examination after completed treatment for malignant neoplasm: Secondary | ICD-10-CM

## 2022-10-15 DIAGNOSIS — C3411 Malignant neoplasm of upper lobe, right bronchus or lung: Secondary | ICD-10-CM | POA: Diagnosis not present

## 2022-10-15 DIAGNOSIS — E063 Autoimmune thyroiditis: Secondary | ICD-10-CM | POA: Diagnosis not present

## 2022-10-15 DIAGNOSIS — Z85118 Personal history of other malignant neoplasm of bronchus and lung: Secondary | ICD-10-CM | POA: Diagnosis not present

## 2022-10-15 DIAGNOSIS — Z87891 Personal history of nicotine dependence: Secondary | ICD-10-CM | POA: Diagnosis not present

## 2022-10-15 NOTE — Progress Notes (Signed)
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  She has been referred to [place] for findings of bone metastases and her recent MRI.  She has a prior history of 3 packs/day day smoking for over 45 years and quit smoking 5 years ago.She had a CT chest abdomen pelvis in 2018 which showed a 5 mm lung nodule in the left lower lobe.  Recently over the last 2 months patient has been having worsening back pain and was referred to orthopedics.  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  Pathologic fracture of S1.   PET scan showed 2 RUL lung nodules, hilar and mediastinal adenopathy and widespread bone mets. MRI brain negative.   Patient completed palliative RT to her spine. Bronchoscopy showed small cell lung cancer.  Palliative carboplatin, etoposide and Tecentriq started on 08/18/2018. Scans after 4 cycles showed stable disease. 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  She  reports being compliant but her values fluctuate widely.    Interval history-she reports doing well although she remains confused at baseline.  She has had chronic memory loss.  She was evaluated by psychiatry recently as well.  ECOG PS- 1 Pain scale- 0  Review of systems- Review of Systems  Constitutional:  Negative for chills, fever, malaise/fatigue and weight loss.  HENT:  Negative for congestion, ear discharge and nosebleeds.   Eyes:  Negative for blurred vision.  Respiratory:  Negative for cough, hemoptysis, sputum production, shortness of breath and wheezing.   Cardiovascular:  Negative for chest pain, palpitations, orthopnea and claudication.  Gastrointestinal:  Negative for abdominal pain, blood in stool, constipation, diarrhea, heartburn, melena, nausea and vomiting.  Genitourinary:  Negative for dysuria, flank pain, frequency, hematuria and urgency.  Musculoskeletal:  Negative for back pain, joint pain and myalgias.  Skin:  Negative for rash.  Neurological:  Negative for dizziness, tingling, focal weakness, seizures, weakness and headaches.  Endo/Heme/Allergies:  Does not bruise/bleed easily.  Psychiatric/Behavioral:  Positive for memory loss. Negative for depression and suicidal ideas. The patient does not have insomnia.       Allergies  Allergen Reactions   Bee Venom Swelling     Past Medical History:  Diagnosis Date   Arthritis    Cirrhosis (HCC)    Dementia (HCC)    Depression    GERD (gastroesophageal reflux disease)    Hyperlipidemia    Hypertension    Lung cancer (HCC)    Metastatic bone cancer      Past Surgical History:  Procedure Laterality Date   APPENDECTOMY  1971   CHOLECYSTECTOMY  1971   COLONOSCOPY WITH PROPOFOL N/A 11/15/2016   Procedure: COLONOSCOPY WITH PROPOFOL;  Surgeon: Wyline Mood, MD;  Location: [facility] ENDOSCOPY;  Service: Gastroenterology;  Laterality: N/A;   ENDOBRONCHIAL ULTRASOUND Right 07/30/2018   Procedure: ENDOBRONCHIAL  ULTRASOUND;  Surgeon: Shane Crutch, MD;  Location: [facility] ORS;  Service: Pulmonary;  Laterality: Right;   ESOPHAGOGASTRODUODENOSCOPY (EGD) WITH PROPOFOL N/A 01/07/2017   Procedure: ESOPHAGOGASTRODUODENOSCOPY (EGD) WITH PROPOFOL;  Surgeon: Wyline Mood, MD;  Location: [facility] ENDOSCOPY;  Service: Gastroenterology;  Laterality: N/A;   LAPAROSCOPY N/A 03/01/2017   Procedure: LAPAROSCOPY DIAGNOSTIC;  Surgeon: Earline Mayotte, MD;  Location: [facility] ORS;  Service: General;  Laterality: N/A;   PORTA CATH INSERTION N/A 08/14/2018   Procedure: PORTA CATH INSERTION;  Surgeon: Annice Needy, MD;  Location: [facility] INVASIVE CV LAB;  Service: Cardiovascular;  Laterality: N/A;   TONSILECTOMY, ADENOIDECTOMY, BILATERAL MYRINGOTOMY AND TUBES  1955   TONSILLECTOMY     VENTRAL HERNIA REPAIR N/A 03/01/2017   10 x 14 CM Ventralight ST mesh, intraperitoneal location.    VENTRAL HERNIA REPAIR N/A 03/01/2017   Procedure: HERNIA REPAIR VENTRAL ADULT;  Surgeon: Earline Mayotte, MD;  Location: [facility] ORS;  Service: General;  Laterality: N/A;    Social History   Socioeconomic History   Marital status: Divorced    Spouse name: Not on file   Number of children: 2   Years of education: Not on file   Highest education level: High school graduate  Occupational History   Not on file  Tobacco Use   Smoking status: Former    Current packs/day: 0.00    Average packs/day: 2.0 packs/day for 50.0 years (100.0 ttl pk-yrs)    Types: Cigarettes, E-cigarettes    Start date: 02/06/1962    Quit date: 02/07/2012    Years since quitting: 10.6   Smokeless tobacco: Never  Vaping Use   Vaping status: Former   Start date: 10/06/2013   Devices: uses no liquid  Substance and Sexual Activity   Alcohol use: No    Alcohol/week: 0.0 standard drinks of alcohol   Drug use: No   Sexual activity: Yes  Other Topics Concern   Not on file  Social History Narrative   Married   Retired   Engineer, manufacturing systems level of education    No children     1 cup of coffee   Social Determinants of Health   Financial Resource Strain: Low Risk  (09/08/2020)   Overall Financial Resource Strain (CARDIA)    Difficulty of Paying Living Expenses: Not hard at all  Food Insecurity: No Food Insecurity (04/20/2022)   Hunger Vital Sign    Worried About Running Out of Food in the Last Year: Never true    Ran Out of Food in the Last Year: Never true  Transportation Needs: Unmet Transportation Needs (04/20/2022)   PRAPARE - Transportation    Lack of Transportation (Medical): No    Lack of Transportation (Non-Medical): Yes  Physical Activity: Unknown (10/29/2018)   Exercise Vital Sign    Days of Exercise per Week: Patient declined    Minutes of Exercise per Session: Patient declined  Stress: No Stress Concern Present (09/08/2020)   [employer] of Occupational Health - Occupational Stress Questionnaire    Feeling of Stress : Not at all  Social Connections: Unknown (09/08/2020)   Social Connection and Isolation Panel [NHANES]    Frequency of Communication with Friends and Family: More than three times a week    Frequency of Social Gatherings with  Friends and Family: Not on file    Attends Religious Services: Not on file    Active Member of Clubs or Organizations: Not on file    Attends Club or Organization Meetings: Not on file    Marital Status: Married  Intimate Partner Violence: Not At Risk (09/08/2020)   Humiliation, Afraid, Rape, and Kick questionnaire    Fear of Current or Ex-Partner: No    Emotionally Abused: No    Physically Abused: No    Sexually Abused: No    Family History  Problem Relation Age of Onset   Hypertension Mother    Ovarian cancer Mother 58   Dementia Mother    Heart disease Father    Stroke Father    Seizures Father    Ovarian cancer Sister        ? dx cancer had hyst.   Breast cancer Neg Hx      Current Outpatient Medications:    amLODipine (NORVASC) 5 MG tablet, Take 1 tablet by mouth once daily, Disp: 90 tablet,  Rfl: 0   buPROPion (WELLBUTRIN XL) 300 MG 24 hr tablet, Take 1 tablet by mouth once daily, Disp: 90 tablet, Rfl: 0   divalproex (DEPAKOTE) 250 MG DR tablet, Take 1 tablet (250 mg total) by mouth 2 (two) times daily., Disp: 60 tablet, Rfl: 11   levothyroxine (SYNTHROID) 125 MCG tablet, Take 1 tablet (125 mcg total) by mouth daily before breakfast. Take the pill first thing in the morning. Do not take other pills at the same time., Disp: 30 tablet, Rfl: 3   memantine (NAMENDA) 10 MG tablet, Take 1 tablet (10 mg total) by mouth 2 (two) times daily., Disp: 60 tablet, Rfl: 11   Multiple Vitamin (MULTIVITAMIN) tablet, Take 1 tablet by mouth daily., Disp: , Rfl:    QUEtiapine (SEROQUEL) 25 MG tablet, Take 0.5 tablets (12.5 mg total) by mouth 2 (two) times daily., Disp: 30 tablet, Rfl: 11   rivastigmine (EXELON) 3 MG capsule, Take 1 capsule (3 mg total) by mouth 2 (two) times daily., Disp: 60 capsule, Rfl: 11   rosuvastatin (CRESTOR) 20 MG tablet, Take 1 tablet (20 mg total) by mouth daily., Disp: 90 tablet, Rfl: 3   sertraline (ZOLOFT) 100 MG tablet, Take 2 tablets (200 mg total) by mouth daily., Disp: 180 tablet, Rfl: 1 No current facility-administered medications for this visit.  Facility-Administered Medications Ordered in Other Visits:    denosumab (XGEVA) injection 120 mg, 120 mg, Subcutaneous, Q30 days, Creig Hines, MD, 120 mg at 03/05/19 1525   heparin lock flush 100 UNIT/ML injection, , , ,   Physical exam:  Vitals:   10/15/22 1029  BP: 134/72  Pulse: 78  Resp: 16  Temp: (!) 96.1 F (35.6 C)  TempSrc: Tympanic  SpO2: 94%  Weight: 149 lb 12.8 oz (67.9 kg)   Physical Exam Cardiovascular:     Rate and Rhythm: Normal rate and regular rhythm.     Heart sounds: Normal heart sounds.  Pulmonary:     Effort: Pulmonary effort is normal.     Breath sounds: Normal breath sounds.  Abdominal:     General: Bowel sounds are normal.     Palpations: Abdomen is soft.  Skin:    General: Skin  is warm and dry.  Neurological:     Mental Status: She is alert.     Comments: Oriented to self         Latest Ref Rng & Units 09/11/2022   10:07 AM  CMP  Glucose 70 - 99 mg/dL 82   BUN 8 - 23 mg/dL 16   Creatinine 2.72 - 1.00 mg/dL 5.36   Sodium 644 - 034 mmol/L 139   Potassium 3.5 - 5.1 mmol/L 3.2   Chloride 98 - 111 mmol/L 106   CO2 22 - 32 mmol/L 24   Calcium 8.9 - 10.3 mg/dL 7.7   Total Protein 6.5 - 8.1 g/dL 5.9   Total Bilirubin 0.3 - 1.2 mg/dL 0.5   Alkaline Phos 38 - 126 U/L 103   AST 15 - 41 U/L 53   ALT 0 - 44 U/L 43       Latest Ref Rng & Units 09/11/2022   10:07 AM  CBC  WBC 4.0 - 10.5 K/uL 4.4   Hemoglobin 12.0 - 15.0 g/dL 74.2   Hematocrit 59.5 - 46.0 % 39.9   Platelets 150 - 400 K/uL 165     No images are attached to the encounter.  CT CHEST ABDOMEN PELVIS W CONTRAST  Result Date: 10/12/2022 CLINICAL DATA:  Metastatic non-small cell lung cancer restaging * Tracking Code: BO * EXAM: CT CHEST, ABDOMEN, AND PELVIS WITH CONTRAST TECHNIQUE: Multidetector CT imaging of the chest, abdomen and pelvis was performed following the standard protocol during bolus administration of intravenous contrast. RADIATION DOSE REDUCTION: This exam was performed according to the departmental dose-optimization program which includes automated exposure control, adjustment of the mA and/or kV according to patient size and/or use of iterative reconstruction technique. CONTRAST:  80mL OMNIPAQUE IOHEXOL 300 MG/ML  SOLN COMPARISON:  05/30/2022 FINDINGS: CT CHEST FINDINGS Cardiovascular: Right chest port catheter. Aortic atherosclerosis. Three-vessel coronary artery calcifications. Normal heart size. No pericardial effusion. Mediastinum/Nodes: No enlarged mediastinal, hilar, or axillary lymph nodes. Thyroid gland, trachea, and esophagus demonstrate no significant findings. Lungs/Pleura: Moderate centrilobular emphysema. Diffuse bilateral bronchial wall thickening. Occasional small pulmonary nodules  unchanged, for example a 0.4 cm nodule of the posterior right upper lobe (series 3, image 34). Fine centrilobular nodularity throughout the lungs, somewhat more focal in the peripheral left upper lobe (series 3, image 29). No pleural effusion or pneumothorax. Musculoskeletal: No chest wall abnormality. No acute osseous findings. CT ABDOMEN PELVIS FINDINGS Hepatobiliary: No focal liver abnormality is seen. Coarse, nodular contour of the liver. Status post cholecystectomy. No biliary dilatation. Pancreas: Unremarkable. No pancreatic ductal dilatation or surrounding inflammatory changes. Spleen: Normal in size without significant abnormality. Adrenals/Urinary Tract: Adrenal glands are unremarkable. Kidneys are normal, without renal calculi, solid lesion, or hydronephrosis. Bladder is unremarkable. Stomach/Bowel: Stomach is within normal limits. Appendix appears normal. No evidence of bowel wall thickening, distention, or inflammatory changes. Occasional sigmoid diverticula. Vascular/Lymphatic: Aortic atherosclerosis. No enlarged abdominal or pelvic lymph nodes. Reproductive: No mass or other abnormality. Other: No abdominal wall hernia or abnormality. No ascites. Musculoskeletal: No acute osseous findings. Unchanged mildly sclerotic vertebral wedge and endplate deformities, including of T2, L2, L3, and L4, as well as S1 (series 6, image 77). IMPRESSION: 1. No evidence of recurrent or metastatic disease in the chest, abdomen, or pelvis. 2. Occasional small pulmonary nodules unchanged, most likely benign and incidental. Continued attention on follow-up. 3. Emphysema. Background of very fine centrilobular nodularity, nonspecific and infectious or inflammatory, consistent with smoking-related respiratory bronchiolitis. 4. Coarse, nodular contour of the liver, consistent with cirrhosis. 5. Coronary artery disease. Aortic Atherosclerosis (ICD10-I70.0) and Emphysema (ICD10-J43.9). Electronically Signed   By: Jearld Lesch  M.D.   On: 10/12/2022 14:20     Assessment and plan- Patient is a 72 y.o.  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  She is here for a routine follow-up visit  I have reviewed CT chest abdomen and pelvis images independently and discussed findings with the patient which does not show anyEvidence of recurrent or progressive disease.  I will continue to monitor her off treatment at this time with a repeat CT scan in about 5 to 6 months time.  She will continue with memantine and rivastigmine for her dementia and follows up with urology and recently seen by psychiatry as well.  We will continue with port flushes every 3 months   Visit Diagnosis 1. Encounter for follow-up surveillance of lung cancer      Dr. Owens Shark, MD, MPH [facility] at [facility] [phone] 10/15/2022 1:54 PM

## 2022-10-16 ENCOUNTER — Ambulatory Visit (INDEPENDENT_AMBULATORY_CARE_PROVIDER_SITE_OTHER): Payer: PPO | Admitting: Psychiatry

## 2022-10-16 DIAGNOSIS — Z91199 Patient's noncompliance with other medical treatment and regimen due to unspecified reason: Secondary | ICD-10-CM

## 2022-10-23 ENCOUNTER — Encounter
Admission: RE | Admit: 2022-10-23 | Discharge: 2022-10-23 | Disposition: A | Discharge status: Home / Self Care | Payer: PPO | Source: Ambulatory Visit | Attending: Oncology | Admitting: Oncology

## 2022-10-23 ENCOUNTER — Encounter
Admission: RE | Admit: 2022-10-23 | Discharge: 2022-10-23 | Disposition: A | Discharge status: Home / Self Care | Payer: PPO | Source: Ambulatory Visit | Attending: Oncology

## 2022-10-23 DIAGNOSIS — Z85118 Personal history of other malignant neoplasm of bronchus and lung: Secondary | ICD-10-CM | POA: Diagnosis not present

## 2022-10-23 DIAGNOSIS — C3490 Malignant neoplasm of unspecified part of unspecified bronchus or lung: Secondary | ICD-10-CM | POA: Diagnosis not present

## 2022-10-23 DIAGNOSIS — Z08 Encounter for follow-up examination after completed treatment for malignant neoplasm: Secondary | ICD-10-CM | POA: Diagnosis not present

## 2022-10-23 MED ORDER — TECHNETIUM TC 99M MEDRONATE IV KIT
20.0000 | PACK | Freq: Once | INTRAVENOUS | Status: AC | PRN
  Administered 2022-10-23: 21.4 via INTRAVENOUS

## 2022-10-24 ENCOUNTER — Other Ambulatory Visit: Payer: Self-pay | Admitting: *Deleted

## 2022-10-24 MED ORDER — LEVOTHYROXINE SODIUM 125 MCG PO TABS: 125.0000 ug | ORAL_TABLET | Freq: Every day | ORAL | 3 refills | Status: DC

## 2022-11-01 ENCOUNTER — Other Ambulatory Visit: Payer: Self-pay | Admitting: Family Medicine

## 2022-11-01 DIAGNOSIS — F331 Major depressive disorder, recurrent, moderate: Secondary | ICD-10-CM

## 2022-11-07 ENCOUNTER — Other Ambulatory Visit: Payer: Self-pay | Admitting: Family Medicine

## 2022-11-07 DIAGNOSIS — I10 Essential (primary) hypertension: Secondary | ICD-10-CM

## 2022-11-12 ENCOUNTER — Other Ambulatory Visit: Payer: Self-pay | Admitting: Family Medicine

## 2022-11-12 DIAGNOSIS — I10 Essential (primary) hypertension: Secondary | ICD-10-CM

## 2022-11-12 DIAGNOSIS — F331 Major depressive disorder, recurrent, moderate: Secondary | ICD-10-CM

## 2022-11-14 ENCOUNTER — Ambulatory Visit: Payer: PPO | Admitting: Neurology

## 2022-11-14 ENCOUNTER — Encounter: Payer: Self-pay | Admitting: Neurology

## 2022-11-14 VITALS — BP 131/76 | HR 79 | Ht 68.0 in | Wt 146.0 lb

## 2022-11-14 DIAGNOSIS — F02B18 Dementia in other diseases classified elsewhere, moderate, with other behavioral disturbance: Secondary | ICD-10-CM

## 2022-11-14 DIAGNOSIS — R269 Unspecified abnormalities of gait and mobility: Secondary | ICD-10-CM

## 2022-11-14 DIAGNOSIS — G301 Alzheimer's disease with late onset: Secondary | ICD-10-CM

## 2022-11-14 DIAGNOSIS — R296 Repeated falls: Secondary | ICD-10-CM | POA: Diagnosis not present

## 2022-11-14 MED ORDER — MEMANTINE HCL 10 MG PO TABS: 10.0000 mg | ORAL_TABLET | Freq: Two times a day (BID) | ORAL | 11 refills | Status: DC

## 2022-11-14 MED ORDER — RIVASTIGMINE TARTRATE 3 MG PO CAPS
3.0000 mg | ORAL_CAPSULE | Freq: Two times a day (BID) | ORAL | 11 refills | Status: DC
  Filled 2023-09-28 – 2023-10-14 (×4): qty 60, 30d supply, fill #0
  Filled 2023-11-06 – 2023-11-12 (×2): qty 60, 30d supply, fill #1
  Filled ????-??-??: fill #0

## 2022-11-14 MED ORDER — DIVALPROEX SODIUM 250 MG PO DR TAB: 250.0000 mg | DELAYED_RELEASE_TABLET | Freq: Two times a day (BID) | ORAL | 11 refills | Status: DC

## 2022-11-14 MED ORDER — QUETIAPINE FUMARATE 25 MG PO TABS: 12.5000 mg | ORAL_TABLET | Freq: Two times a day (BID) | ORAL | 11 refills | Status: DC

## 2022-11-14 NOTE — Progress Notes (Signed)
[name]  PATIENT: Anne [name] DOB: 20-May-1950  REQUESTING CLINICIAN: Glori Luis, MD HISTORY FROM: Patient and husband  REASON FOR VISIT: Memory decline    HISTORICAL  CHIEF COMPLAINT: Dementia follow-up  INTERVAL HISTORY 11/14/2022:  Patient presents today for follow-up, he is accompanied by husband.  Last visit was in April, since then husband tells me that she is about the same.  She still have hallucinations, seeing her deceased parents.  Husband tells me at time, she will argue with him and telling him that we need to go home thinking that they live at her parents house.  She is also making reference to 2 dogs when in fact they have 1.   Her memory is still poor, she does have paranoid and delusions.  She has been having multiple falls, has a walker but does not use it.  Husband is also reporting sundowning and seasonal depression.  They are on the Seroquel 12.5 mg twice daily.  Husband feels the Seroquel is helpful.   INTERVAL HISTORY 05/09/2022:  Patient presents today for follow-up, she is accompanied by her husband.  Since last visit in January, husband reports patient has been doing better.  At 1 point She did have a UTI and at that time was very confused and her memory got worse.  Since the UTI has been treated she has been doing well.  She tolerates the Seroquel well, currently on 12.5 mg nightly but still of behavioral issues described as hallucinations, seeing things that are not there, she thinks that her daughter and grandchildren live in the house, talks about them all the time but they live in [place].  She is compliant with the rest of her medication including Exelon, Namenda and Depakote.  She had follow-up with her PCP who referred patient to home health. No seizures since last visit.    INTERVAL HISTORY 02/19/2022:  Patient presents today for follow-up, she is accompanied by [facility].  He reports that memory is getting poor and worse.   She is compliant with the Exelon 1.5 mg twice daily but has not seen any improvement.  They are time that she still wants to leave the house but has not done so.  She is on Seroquel 12.5 mg nightly.  Johnny reports that sleep is good.  He is the only one who helps her; He does work however 3 times a week and there are times where patient is at home alone. There is also of a seizure a month ago. Seizures described as staring spell and being unresponsive. Johnny reports the day of the headaches, she complained of bad headaches. She has not had any additional events since leaving the hospital.    INTERVAL HISTORY 08/15/21 Patient presents today with husband for follow up worsening memory, and additional behavioral changes. She has been non compliant with her medications. Husband has noted that patient has been packing up the car thinking she is about ot move. She has been talking about her mother in [place] when her mother passed away.  Currently she is not  driving, last time she drove to [place] for a weddig when there was no wedding, husband has to call a silver alert on her. At home, she does not cook, use the microwave for frozen dinner. Husband reports that she needs help with showeing and dressing. She misplaces items all the time.  They already have home health services that come once a week and there are complaints of multiple falls.  HISTORY OF PRESENT ILLNESS:  This is a 72 year old woman with past medical history of lung cancer currently getting chemo every 3 to 4 weeks, with bone metastasis, depression (severe during wintertime) hypertension, hypothyroidism, and hyperlipidemia who is presenting with memory decline.  Patient stated that her memory is terrible, she forgets a lot of things.  She has issue with remote memory and also report forgetting recent conversations.  She also misplaces a lot of things.  She currently drives, denies any recent accident, denies being lost in familiar  places.  She can handle her financial affairs by herself, denies being late on her bills.  She knows her family members well, do not confuse them with other people.  She has started Prevagen and reports some improvement with the medication.   Husband stated patient is more forgetful than before, and mainly misplace a lot of things.  He has to repeat himself multiple times because patient will keep on asking the same questions over and over.   She has not been wandering outside her house.  She is able to complete all activities of daily living, she is able to cook, clean, bathe and dress herself.     OTHER MEDICAL CONDITIONS: lung cancer chemo in remission but still undergoing treatment every 3 to 4 weeks, Depression (severe during the winter), HTN, Hypothyroidism, HLD   REVIEW OF SYSTEMS: Full 14 system review of systems performed and negative with exception of: as noted in the HPI   ALLERGIES: Allergies  Allergen Reactions   Bee Venom Swelling    HOME MEDICATIONS: Outpatient Medications Prior to Visit  Medication Sig Dispense Refill   amLODipine (NORVASC) 5 MG tablet Take 1 tablet by mouth once daily 90 tablet 0   buPROPion (WELLBUTRIN XL) 300 MG 24 hr tablet Take 1 tablet by mouth once daily 90 tablet 0   divalproex (DEPAKOTE) 250 MG DR tablet Take 1 tablet (250 mg total) by mouth 2 (two) times daily. 60 tablet 11   levothyroxine (SYNTHROID) 125 MCG tablet Take 1 tablet (125 mcg total) by mouth daily before breakfast. Take the pill first thing in the morning. Do not take other pills at the same time. 30 tablet 3   memantine (NAMENDA) 10 MG tablet Take 1 tablet (10 mg total) by mouth 2 (two) times daily. 60 tablet 11   Multiple Vitamin (MULTIVITAMIN) tablet Take 1 tablet by mouth daily.     QUEtiapine (SEROQUEL) 25 MG tablet Take 0.5 tablets (12.5 mg total) by mouth 2 (two) times daily. 30 tablet 11   rivastigmine (EXELON) 3 MG capsule Take 1 capsule (3 mg total) by mouth 2 (two) times  daily. 60 capsule 11   rosuvastatin (CRESTOR) 20 MG tablet Take 1 tablet (20 mg total) by mouth daily. 90 tablet 3   sertraline (ZOLOFT) 100 MG tablet TAKE 1 & 1/2 (ONE & ONE-HALF) TABLETS BY MOUTH ONCE DAILY 135 tablet 0   Facility-Administered Medications Prior to Visit  Medication Dose Route Frequency Provider Last Rate Last Admin   denosumab (XGEVA) injection 120 mg  120 mg Subcutaneous Q30 days Creig Hines, MD   120 mg at 03/05/19 1525   heparin lock flush 100 UNIT/ML injection             PAST MEDICAL HISTORY: Past Medical History:  Diagnosis Date   Arthritis    Cirrhosis (HCC)    Dementia (HCC)    Depression    GERD (gastroesophageal reflux disease)    Hyperlipidemia  Hypertension    Lung cancer (HCC)    Metastatic bone cancer     PAST SURGICAL HISTORY: Past Surgical History:  Procedure Laterality Date   APPENDECTOMY  1971   CHOLECYSTECTOMY  1971   COLONOSCOPY WITH PROPOFOL N/A 11/15/2016   Procedure: COLONOSCOPY WITH PROPOFOL;  Surgeon: Wyline Mood, MD;  Location: [facility] ENDOSCOPY;  Service: Gastroenterology;  Laterality: N/A;   ENDOBRONCHIAL ULTRASOUND Right 07/30/2018   Procedure: ENDOBRONCHIAL ULTRASOUND;  Surgeon: Shane Crutch, MD;  Location: [facility] ORS;  Service: Pulmonary;  Laterality: Right;   ESOPHAGOGASTRODUODENOSCOPY (EGD) WITH PROPOFOL N/A 01/07/2017   Procedure: ESOPHAGOGASTRODUODENOSCOPY (EGD) WITH PROPOFOL;  Surgeon: Wyline Mood, MD;  Location: [facility] ENDOSCOPY;  Service: Gastroenterology;  Laterality: N/A;   LAPAROSCOPY N/A 03/01/2017   Procedure: LAPAROSCOPY DIAGNOSTIC;  Surgeon: Earline Mayotte, MD;  Location: [facility] ORS;  Service: General;  Laterality: N/A;   PORTA CATH INSERTION N/A 08/14/2018   Procedure: PORTA CATH INSERTION;  Surgeon: Annice Needy, MD;  Location: [facility] INVASIVE CV LAB;  Service: Cardiovascular;  Laterality: N/A;   TONSILECTOMY, ADENOIDECTOMY, BILATERAL MYRINGOTOMY AND TUBES  1955   TONSILLECTOMY     VENTRAL HERNIA REPAIR N/A  03/01/2017   10 x 14 CM Ventralight ST mesh, intraperitoneal location.    VENTRAL HERNIA REPAIR N/A 03/01/2017   Procedure: HERNIA REPAIR VENTRAL ADULT;  Surgeon: Earline Mayotte, MD;  Location: [facility] ORS;  Service: General;  Laterality: N/A;    FAMILY HISTORY: Family History  Problem Relation Age of Onset   Hypertension Mother    Ovarian cancer Mother 10   Dementia Mother    Heart disease Father    Stroke Father    Seizures Father    Ovarian cancer Sister        ? dx cancer had hyst.   Breast cancer Neg Hx     SOCIAL HISTORY: Social History   Socioeconomic History   Marital status: Divorced    Spouse name: Not on file   Number of children: 2   Years of education: Not on file   Highest education level: High school graduate  Occupational History   Not on file  Tobacco Use   Smoking status: Former    Current packs/day: 0.00    Average packs/day: 2.0 packs/day for 50.0 years (100.0 ttl pk-yrs)    Types: Cigarettes, E-cigarettes    Start date: 02/06/1962    Quit date: 02/07/2012    Years since quitting: 10.7   Smokeless tobacco: Never  Vaping Use   Vaping status: Former   Start date: 10/06/2013   Devices: uses no liquid  Substance and Sexual Activity   Alcohol use: No    Alcohol/week: 0.0 standard drinks of alcohol   Drug use: No   Sexual activity: Yes  Other Topics Concern   Not on file  Social History Narrative   Married   Retired   Engineer, manufacturing systems level of education    No children    1 cup of coffee   Social Determinants of Health   Financial Resource Strain: Low Risk  (09/08/2020)   Overall Financial Resource Strain (CARDIA)    Difficulty of Paying Living Expenses: Not hard at all  Food Insecurity: No Food Insecurity (04/20/2022)   Hunger Vital Sign    Worried About Running Out of Food in the Last Year: Never true    Ran Out of Food in the Last Year: Never true  Transportation Needs: Unmet Transportation Needs (04/20/2022)   PRAPARE - Transportation     Lack  of Transportation (Medical): No    Lack of Transportation (Non-Medical): Yes  Physical Activity: Unknown (10/29/2018)   Exercise Vital Sign    Days of Exercise per Week: Patient declined    Minutes of Exercise per Session: Patient declined  Stress: No Stress Concern Present (09/08/2020)   [employer] of Occupational Health - Occupational Stress Questionnaire    Feeling of Stress : Not at all  Social Connections: Unknown (09/08/2020)   Social Connection and Isolation Panel [NHANES]    Frequency of Communication with Friends and Family: More than three times a week    Frequency of Social Gatherings with Friends and Family: Not on file    Attends Religious Services: Not on file    Active Member of Clubs or Organizations: Not on file    Attends Banker Meetings: Not on file    Marital Status: Married  Intimate Partner Violence: Not At Risk (09/08/2020)   Humiliation, Afraid, Rape, and Kick questionnaire    Fear of Current or Ex-Partner: No    Emotionally Abused: No    Physically Abused: No    Sexually Abused: No     PHYSICAL EXAM  GENERAL EXAM/CONSTITUTIONAL: Vitals:  Vitals:   11/14/22 1136  BP: 131/76  Pulse: 79  Weight: 146 lb (66.2 kg)  Height: 5\' 8"  (1.727 m)    Body mass index is 22.2 kg/m. Wt Readings from Last 3 Encounters:  11/14/22 146 lb (66.2 kg)  10/15/22 149 lb 12.8 oz (67.9 kg)  08/22/22 155 lb (70.3 kg)   Patient is in no distress; well developed, nourished and groomed; neck is supple  EYES: Visual fields full to confrontation, Extraocular movements intacts,   MUSCULOSKELETAL: Gait, strength, tone, movements noted in Neurologic exam below  NEUROLOGIC: MENTAL STATUS:     01/19/2021    8:32 AM 03/14/2020   10:47 AM 06/04/2017    9:34 AM  MMSE - Mini Mental State Exam  Orientation to time 4 4 5   Orientation to Place 5 5 5   Registration 3 3 3   Attention/ Calculation 2 5 5   Recall 0 0 0  Language- name 2 objects 2 2 2   Language-  repeat 1 1 1   Language- follow 3 step command 3 2 3   Language- read & follow direction 1 1 1   Write a sentence 1 1 1   Copy design 1 1 1   Total score 23 25 27       02/19/2022    9:52 AM 08/15/2021    1:32 PM  Montreal Cognitive Assessment   Visuospatial/ Executive (0/5) 1 2  Naming (0/3) 1 2  Attention: Read list of digits (0/2) 2 2  Attention: Read list of letters (0/1) 1 1  Attention: Serial 7 subtraction starting at 100 (0/3) 2 3  Language: Repeat phrase (0/2) 2 2  Language : Fluency (0/1) 0 0  Abstraction (0/2) 1 1  Delayed Recall (0/5) 0 0  Orientation (0/6) 2 3  Total 12 16  Adjusted Score (based on education) 12      CRANIAL NERVE:  2nd, 3rd, 4th, 6th - visual fields full to confrontation, extraocular muscles intact, no nystagmus 5th - facial sensation symmetric 7th - facial strength symmetric 8th - hearing intact 9th - palate elevates symmetrically, uvula midline 11th - shoulder shrug symmetric 12th - tongue protrusion midline  MOTOR:  normal bulk and tone, full strength in the BUE, BLE  SENSORY:  normal and symmetric to light touch  COORDINATION:  finger-nose-finger, fine finger movements  normal  GAIT/STATION:  Wide base, does not use an assistive device.    DIAGNOSTIC DATA (LABS, IMAGING, TESTING) - I reviewed patient records, labs, notes, testing and imaging myself where available.  Lab Results  Component Value Date   WBC 4.4 09/11/2022   HGB 12.6 09/11/2022   HCT 39.9 09/11/2022   MCV 91.3 09/11/2022   PLT 165 09/11/2022      Component Value Date/Time   NA 139 09/11/2022 1007   NA 142 07/17/2021 1347   K 3.2 (L) 09/11/2022 1007   CL 106 09/11/2022 1007   CO2 24 09/11/2022 1007   GLUCOSE 82 09/11/2022 1007   BUN 16 09/11/2022 1007   BUN 12 07/17/2021 1347   CREATININE 0.79 09/11/2022 1007   CREATININE 1.09 02/26/2012 0925   CALCIUM 7.7 (L) 09/11/2022 1007   PROT 5.9 (L) 09/11/2022 1007   PROT 7.1 07/17/2021 1347   ALBUMIN 3.0 (L)  09/11/2022 1007   ALBUMIN 4.5 07/17/2021 1347   AST 53 (H) 09/11/2022 1007   ALT 43 09/11/2022 1007   ALKPHOS 103 09/11/2022 1007   BILITOT 0.5 09/11/2022 1007   BILITOT 0.4 07/17/2021 1347   GFRNONAA >60 09/11/2022 1007   GFRNONAA 54 (L) 02/26/2012 0925   GFRAA >60 11/05/2019 2025   GFRAA >60 02/26/2012 0925   Lab Results  Component Value Date   CHOL 177 08/24/2020   HDL 54.90 08/24/2020   LDLCALC 82 08/24/2020   LDLDIRECT 95.0 07/22/2019   TRIG 198.0 (H) 08/24/2020   CHOLHDL 3 08/24/2020   Lab Results  Component Value Date   HGBA1C 6.1 09/12/2016   Lab Results  Component Value Date   VITAMINB12 1,235 (H) 10/01/2018   Lab Results  Component Value Date   TSH 0.149 (L) 09/11/2022    MRI Brain 08/2020 1. No evidence of intracranial metastasis. 2. Evidence of right mandibular head metastasis which is new from 2020 brain MRI and possibly new from a February 2022 bone scan.  Head CT 02/07/2022 1. No acute intracranial abnormality. 2. Chronic microvascular disease. 3. Mild sphenoid sinus and left posterior ethmoid air cell inflammation.    ASSESSMENT AND PLAN  72 y.o. year old female with history lung cancer on chemo with bone metastasis, depression (severe during wintertime) hypertension, hypothyroidism, and hyperlipidemia who is presenting for follow up for her dementia.  In terms of dementia, memory is getting worse per son.  She is currently on Exelon 3 mg twice daily and Namenda 10 mg twice daily and Seroquel 12.5 mg twice and Depakote 250 mg twice daily. She continues to have hallucination and delusion. On top of that, she is having multiple falls. Will continue current medications and refer to patient to home health services. Return in a year    No diagnosis found.    There are no Patient Instructions on file for this visit.    No orders of the defined types were placed in this encounter.    No orders of the defined types were placed in this  encounter.    No follow-ups on file. . I have spent a total of 40 minutes dedicated to this patient today, preparing to see patient, performing a medically appropriate examination and evaluation, ordering tests and/or medications and procedures, and counseling and educating the patient/family/caregiver; independently interpreting result and communicating results to the family/patient/caregiver; and documenting clinical information in the electronic medical record.   Windell Norfolk, MD 11/14/2022, 11:39 AM  [name] [address] [place], [name]  [address] [phone]

## 2022-11-14 NOTE — Patient Instructions (Signed)
Continue current medications  Referral to home health services  Continue to follow up with PCP  Return in a year

## 2022-11-20 ENCOUNTER — Telehealth: Payer: Self-pay | Admitting: Neurology

## 2022-11-20 NOTE — Telephone Encounter (Signed)
[facility] will be taking this patient.

## 2022-11-23 ENCOUNTER — Encounter: Payer: Self-pay | Admitting: Family Medicine

## 2022-11-23 ENCOUNTER — Ambulatory Visit (INDEPENDENT_AMBULATORY_CARE_PROVIDER_SITE_OTHER): Payer: PPO | Admitting: Family Medicine

## 2022-11-23 VITALS — BP 122/74 | HR 80 | Temp 97.9°F | Ht 68.0 in | Wt 144.8 lb

## 2022-11-23 DIAGNOSIS — R251 Tremor, unspecified: Secondary | ICD-10-CM | POA: Insufficient documentation

## 2022-11-23 DIAGNOSIS — F03A18 Unspecified dementia, mild, with other behavioral disturbance: Secondary | ICD-10-CM | POA: Diagnosis not present

## 2022-11-23 DIAGNOSIS — E032 Hypothyroidism due to medicaments and other exogenous substances: Secondary | ICD-10-CM

## 2022-11-23 DIAGNOSIS — R296 Repeated falls: Secondary | ICD-10-CM

## 2022-11-23 DIAGNOSIS — Z23 Encounter for immunization: Secondary | ICD-10-CM | POA: Diagnosis not present

## 2022-11-23 LAB — TSH: TSH: 0.11 u[IU]/mL — ABNORMAL LOW (ref 0.35–5.50)

## 2022-11-23 NOTE — Assessment & Plan Note (Signed)
Chronic issue.  Encouraged use of her cane.  They will let us or neurology know if they do not hear from home health physical therapy next week.  Discussed that physical therapy would be beneficial.

## 2022-11-23 NOTE — Assessment & Plan Note (Signed)
Chronic issue.  Most recent TSH was mildly low.  It does not look like her dose was adjusted on the Synthroid.  Will recheck her TSH today and if still low we will need to adjust her Synthroid dose as she could be overtreated leading to her tremor.

## 2022-11-23 NOTE — Assessment & Plan Note (Signed)
Chronic issue.  Seems to be generally stable.  She will continue medication management through neurology.

## 2022-11-23 NOTE — Assessment & Plan Note (Signed)
Chronic issue.  Suspect overtreated hypothyroidism could be contributing.  Also could have some measure of essential tremor.  Will see what her TSH reveals and then determine the neck step in management.

## 2022-11-23 NOTE — Progress Notes (Signed)
[name], MD Phone: [phone]  Anne [name] is a 72 y.o. female who presents today for follow-up.  Dementia: Patient continues to have poor memory.  She follows with neurology.  Her husband reports auditory and visual hallucinations.  They recently saw neurology.  No medication changes were made.  She remains on Seroquel, Depakote, Exelon, and Namenda.  Falls: Patient's husband reports the patient has had some falls recently.  He is not sure why she is falling.  She is not using her cane at home.  She most recently hit her right hip and had bruising on her back and her head though the bruising on her back and head have been improving.  Neurology placed a referral for physical therapy.  Tremor: Patient and her husband both report this is getting worse.  Typically has tremor when she is trying to eat or drink.  She does have some tremor at rest.  Social History   Tobacco Use  Smoking Status Former   Current packs/day: 0.00   Average packs/day: 2.0 packs/day for 50.0 years (100.0 ttl pk-yrs)   Types: Cigarettes, E-cigarettes   Start date: 02/06/1962   Quit date: 02/07/2012   Years since quitting: 10.8  Smokeless Tobacco Never    Current Outpatient Medications on File Prior to Visit  Medication Sig Dispense Refill   amLODipine (NORVASC) 5 MG tablet Take 1 tablet by mouth once daily 90 tablet 0   buPROPion (WELLBUTRIN XL) 300 MG 24 hr tablet Take 1 tablet by mouth once daily 90 tablet 0   divalproex (DEPAKOTE) 250 MG DR tablet Take 1 tablet (250 mg total) by mouth 2 (two) times daily. 60 tablet 11   levothyroxine (SYNTHROID) 125 MCG tablet Take 1 tablet (125 mcg total) by mouth daily before breakfast. Take the pill first thing in the morning. Do not take other pills at the same time. 30 tablet 3   memantine (NAMENDA) 10 MG tablet Take 1 tablet (10 mg total) by mouth 2 (two) times daily. 60 tablet 11   Multiple Vitamin (MULTIVITAMIN) tablet Take 1 tablet by mouth daily.      QUEtiapine (SEROQUEL) 25 MG tablet Take 0.5 tablets (12.5 mg total) by mouth 2 (two) times daily. 30 tablet 11   rivastigmine (EXELON) 3 MG capsule Take 1 capsule (3 mg total) by mouth 2 (two) times daily. 60 capsule 11   rosuvastatin (CRESTOR) 20 MG tablet Take 1 tablet (20 mg total) by mouth daily. 90 tablet 3   sertraline (ZOLOFT) 100 MG tablet TAKE 1 & 1/2 (ONE & ONE-HALF) TABLETS BY MOUTH ONCE DAILY 135 tablet 0   Current Facility-Administered Medications on File Prior to Visit  Medication Dose Route Frequency Provider Last Rate Last Admin   denosumab (XGEVA) injection 120 mg  120 mg Subcutaneous Q30 days Creig Hines, MD   120 mg at 03/05/19 1525   heparin lock flush 100 UNIT/ML injection              ROS see history of present illness  Objective  Physical Exam Vitals:   11/23/22 1049  BP: 122/74  Pulse: 80  Temp: 97.9 F (36.6 C)  SpO2: 98%    BP Readings from Last 3 Encounters:  11/23/22 122/74  11/14/22 131/76  10/15/22 134/72   Wt Readings from Last 3 Encounters:  11/23/22 144 lb 12.8 oz (65.7 kg)  11/14/22 146 lb (66.2 kg)  10/15/22 149 lb 12.8 oz (67.9 kg)    Physical Exam Constitutional:  General: She is not in acute distress.    Appearance: She is not diaphoretic.  Cardiovascular:     Rate and Rhythm: Normal rate and regular rhythm.     Heart sounds: Normal heart sounds.  Pulmonary:     Effort: Pulmonary effort is normal.     Breath sounds: Normal breath sounds.  Musculoskeletal:     Comments: Bruising over her right lateral hip, there is some soft tissue tenderness in the area of the bruise, no bony tenderness in her right hip, good internal and external range of motion right hip with no discomfort  Skin:    General: Skin is warm and dry.  Neurological:     Mental Status: She is alert.     Comments: 5/5 strength in bilateral biceps, triceps, grip, quads, hamstrings, plantar and dorsiflexion, sensation to light touch intact in bilateral UE and  LE, fine tremor noted when patient holding hands up      Assessment/Plan: Please see individual problem list.  Hypothyroidism due to medication Assessment & Plan: Chronic issue.  Most recent TSH was mildly low.  It does not look like her dose was adjusted on the Synthroid.  Will recheck her TSH today and if still low we will need to adjust her Synthroid dose as she could be overtreated leading to her tremor.  Orders: -     TSH  Encounter for administration of vaccine -     Flu Vaccine Trivalent High Dose (Fluad)  Frequent falls Assessment & Plan: Chronic issue.  Encouraged use of her cane.  They will let us or neurology know if they do not hear from home health physical therapy next week.  Discussed that physical therapy would be beneficial.   Mild dementia with other behavioral disturbance, unspecified dementia type [facility]) Assessment & Plan: Chronic issue.  Seems to be generally stable.  She will continue medication management through neurology.   Tremor Assessment & Plan: Chronic issue.  Suspect overtreated hypothyroidism could be contributing.  Also could have some measure of essential tremor.  Will see what her TSH reveals and then determine the neck step in management.     Return in about 3 months (around 02/23/2023).   [name], MD [facility]

## 2022-11-25 ENCOUNTER — Emergency Department
Admission: EM | Admit: 2022-11-25 | Discharge: 2022-11-26 | Disposition: A | Discharge status: Home / Self Care | Payer: PPO | Attending: Emergency Medicine | Admitting: Emergency Medicine

## 2022-11-25 ENCOUNTER — Encounter: Payer: Self-pay | Admitting: Emergency Medicine

## 2022-11-25 ENCOUNTER — Emergency Department: Payer: PPO

## 2022-11-25 DIAGNOSIS — F0390 Unspecified dementia without behavioral disturbance: Secondary | ICD-10-CM | POA: Insufficient documentation

## 2022-11-25 DIAGNOSIS — S0990XA Unspecified injury of head, initial encounter: Secondary | ICD-10-CM | POA: Diagnosis not present

## 2022-11-25 DIAGNOSIS — X58XXXA Exposure to other specified factors, initial encounter: Secondary | ICD-10-CM | POA: Insufficient documentation

## 2022-11-25 DIAGNOSIS — Z85118 Personal history of other malignant neoplasm of bronchus and lung: Secondary | ICD-10-CM | POA: Diagnosis not present

## 2022-11-25 DIAGNOSIS — S065X0A Traumatic subdural hemorrhage without loss of consciousness, initial encounter: Secondary | ICD-10-CM | POA: Diagnosis not present

## 2022-11-25 DIAGNOSIS — R569 Unspecified convulsions: Secondary | ICD-10-CM | POA: Diagnosis not present

## 2022-11-25 DIAGNOSIS — R258 Other abnormal involuntary movements: Secondary | ICD-10-CM | POA: Diagnosis not present

## 2022-11-25 DIAGNOSIS — I10 Essential (primary) hypertension: Secondary | ICD-10-CM | POA: Insufficient documentation

## 2022-11-25 DIAGNOSIS — E039 Hypothyroidism, unspecified: Secondary | ICD-10-CM | POA: Insufficient documentation

## 2022-11-25 DIAGNOSIS — R410 Disorientation, unspecified: Secondary | ICD-10-CM | POA: Diagnosis not present

## 2022-11-25 DIAGNOSIS — S065XAA Traumatic subdural hemorrhage with loss of consciousness status unknown, initial encounter: Secondary | ICD-10-CM

## 2022-11-25 LAB — CBC
HCT: 41.9 % (ref 36.0–46.0)
Hemoglobin: 13.4 g/dL (ref 12.0–15.0)
MCH: 29.1 pg (ref 26.0–34.0)
MCHC: 32 g/dL (ref 30.0–36.0)
MCV: 90.9 fL (ref 80.0–100.0)
Platelets: 163 10*3/uL (ref 150–400)
RBC: 4.61 MIL/uL (ref 3.87–5.11)
RDW: 16.3 % — ABNORMAL HIGH (ref 11.5–15.5)
WBC: 3.8 10*3/uL — ABNORMAL LOW (ref 4.0–10.5)
nRBC: 0 % (ref 0.0–0.2)

## 2022-11-25 LAB — BASIC METABOLIC PANEL
Anion gap: 10 (ref 5–15)
BUN: 19 mg/dL (ref 8–23)
CO2: 27 mmol/L (ref 22–32)
Calcium: 9 mg/dL (ref 8.9–10.3)
Chloride: 101 mmol/L (ref 98–111)
Creatinine, Ser: 0.86 mg/dL (ref 0.44–1.00)
GFR, Estimated: >60 mL/min (ref >60)
Glucose, Bld: 105 mg/dL — ABNORMAL HIGH (ref 70–99)
Potassium: 4.1 mmol/L (ref 3.5–5.1)
Sodium: 138 mmol/L (ref 135–145)

## 2022-11-25 LAB — VALPROIC ACID LEVEL: Valproic Acid Lvl: 18 ug/mL — ABNORMAL LOW (ref 50.0–100.0)

## 2022-11-25 LAB — CBG MONITORING, ED: Glucose-Capillary: 97 mg/dL (ref 70–99)

## 2022-11-25 MED ORDER — ACETAMINOPHEN 500 MG PO TABS
1000.0000 mg | ORAL_TABLET | Freq: Once | ORAL | Status: AC
  Administered 2022-11-25: 1000 mg via ORAL
  Filled 2022-11-25: qty 2

## 2022-11-25 MED ORDER — MORPHINE SULFATE (PF) 2 MG/ML IV SOLN
2.0000 mg | Freq: Once | INTRAVENOUS | Status: AC
  Administered 2022-11-25: 2 mg via INTRAVENOUS
  Filled 2022-11-25: qty 1

## 2022-11-25 MED ORDER — VALPROATE SODIUM 100 MG/ML IV SOLN
650.0000 mg | Freq: Once | INTRAVENOUS | Status: AC
  Administered 2022-11-25: 650 mg via INTRAVENOUS
  Filled 2022-11-25: qty 6.5

## 2022-11-25 MED ORDER — DIVALPROEX SODIUM 125 MG PO DR TAB: 375.0000 mg | DELAYED_RELEASE_TABLET | Freq: Three times a day (TID) | ORAL | 2 refills | Status: DC

## 2022-11-25 NOTE — ED Provider Notes (Signed)
[facility] Provider Note    Event Date/Time   First MD Initiated Contact with Patient 11/25/22 1615     (approximate)   History   Seizure Like Activity   HPI  MERRITT KINGMA is a 72 year old female with history of lung cancer, hypertension, hypothyroidism, dementia presenting to the emergency department for evaluation after seizure-like episode.  Earlier today, patient was complaining of a headache.  She was then noted to have a brief episode of generalized rhythmic shaking. Family reports a history of seizures, unsure if she takes medication for this.  They report her last seizure was about a year ago.  At the time of my initial evaluation, patient and family are concerned that she has not had her medication today.  Patient reports a headache, otherwise feels that she is back at her baseline.  I did review her ER visit from 02/07/2022 at [facility].  At that time, patient presented with seizure-like activity, had reassuring workup and was discharged.  She follow-up with neurology on 02/19/2022.  She was started on low-dose Depakote 250 twice daily to help with possible seizures as well as depression and abnormal behavior.       Physical Exam   Triage Vital Signs: ED Triage Vitals [11/25/22 1328]  Encounter Vitals Group     BP 126/75     Systolic BP Percentile      Diastolic BP Percentile      Pulse Rate 79     Resp 16     Temp 97.6 F (36.4 C)     Temp Source Oral     SpO2 98 %     Weight      Height      Head Circumference      Peak Flow      Pain Score      Pain Loc      Pain Education      Exclude from Growth Chart     Most recent vital signs: Vitals:   11/25/22 2200 11/25/22 2229  BP: 129/76   Pulse:    Resp: 16 18  Temp:  98.4 F (36.9 C)  SpO2:  97%     General: Awake, interactive  Head:  No visible trauma, but does have tenderness over the side of the head CV:  Regular rate, good peripheral perfusion.  Resp:  Lungs clear,  unlabored respirations.  Abd:  Soft, nondistended.  Neuro:  Symmetric facial movement, fluid speech, 5 out of 5 strength in the bilateral upper and lower extremities with normal sensation, normal finger-nose testing, confusion consistent with known history of dementia   ED Results / Procedures / Treatments   Labs (all labs ordered are listed, but only abnormal results are displayed) Labs Reviewed  VALPROIC ACID LEVEL - Abnormal; Notable for the following components:      Result Value   Valproic Acid Lvl 18 (*)    All other components within normal limits  CBC - Abnormal; Notable for the following components:   WBC 3.8 (*)    RDW 16.3 (*)    All other components within normal limits  BASIC METABOLIC PANEL - Abnormal; Notable for the following components:   Glucose, Bld 105 (*)    All other components within normal limits  CBG MONITORING, ED     EKG EKG independently reviewed interpreted by myself (ER attending) demonstrates:  EKG demonstrates normal sinus rhythm at a rate of 80, PR 164, QRS 84, QTc 440, no  acute ST changes  RADIOLOGY Imaging independently reviewed and interpreted by myself demonstrates:  Initial CT head demonstrates bilateral subdural hematoma, radiology notes mixed density on the left, isodense on the right without associated midline shift.  Notified by radiology of these findings.  PROCEDURES:  Critical Care performed: No  Procedures   MEDICATIONS ORDERED IN ED: Medications  acetaminophen (TYLENOL) tablet 1,000 mg (1,000 mg Oral Given 11/25/22 1829)  valproate (DEPACON) 650 mg in dextrose 5 % 50 mL IVPB (0 mg Intravenous Stopped 11/25/22 2009)  morphine (PF) 2 MG/ML injection 2 mg (2 mg Intravenous Given 11/25/22 2100)     IMPRESSION / MDM / ASSESSMENT AND PLAN / ED COURSE  I reviewed the triage vital signs and the nursing notes.  Differential diagnosis includes, but is not limited to, electrolyte abnormality, primary seizure disorder, intracranial  bleed, other acute intracranial process, no focal deficit suggestive for CVA  Patient's presentation is most consistent with acute presentation with potential threat to life or bodily function.  72 year old female presenting after a witnessed seizure-like episode.  History of 1 prior seizure, but I do not see a diagnosis of epilepsy in her chart.  Labs with normal sodium, no critical derangements.  Her valproic acid level is low, but it does appear that this may be is more dosed for behavioral use.  Head CT unfortunately did demonstrate a bilateral subdural hematoma, mixed density or isodense.  With history of fall a week ago, suspect likely secondary to that.    Case was discussed with Dr. Katrinka Blazing with neurosurgery.  He recommended a 6-hour repeat head CT to ensure that the bleed is not progressing.  In the setting of seizure potentially related to her bleed, he did recommend consultation with neurology to discuss AED recommendations.  If repeat head CT remained stable, does think the patient is stable for discharge.    The case was discussed with Dr. Otelia Limes.  He notes that patient's Depakote dosing is not appropriate for seizure prophylaxis.  He recommends a 10 mg/kg IV load of Depakote x 1 after which she recommends placing the patient on 5 mg/kg TID of Depakote 3 times daily (rounded up to 375 mg TID) and having her follow-up as an outpatient.  I did discuss the results of the patient's workup as well as these recommendations with the patient and her husband.  They are comfortable with this plan.    6-hour repeat CT ordered.  This was fortunately stable.  Patient was reevaluated.  No new complaints.  They are comfortable with plan for discharge home.  Will DC with prescription for updated dosing of Depakote.  Strict return precautions provided.  Patient discharged in stable condition.    FINAL CLINICAL IMPRESSION(S) / ED DIAGNOSES   Final diagnoses:  Subdural hematoma (HCC)  Seizure-like activity  (HCC)     Rx / DC Orders   ED Discharge Orders          Ordered    divalproex (DEPAKOTE) 125 MG DR tablet  3 times daily        11/25/22 2351             Note:  This document was prepared using Dragon voice recognition software and may include unintentional dictation errors.   Trinna Post, MD 11/25/22 2352
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
## 2022-11-25 NOTE — Discharge Instructions (Addendum)
You were seen in the ER today for evaluation after a possible seizure.  Your CT scan showed that you do have bleeding in your brain likely from your recent fall.  Fortunately your repeat CT was stable.  I have spoken with our neurology and neurosurgery specialists.  They recommend increasing your dose of Depakote to 375 mg 3 times a day.  I sent a prescription for this to your pharmacy.  Please contact your neurologist to arrange follow-up.  I will also included information to arrange follow-up with her neurosurgeon.  Return to the ER for any new or worsening symptoms.

## 2022-11-26 ENCOUNTER — Telehealth: Payer: Self-pay | Admitting: Neurosurgery

## 2022-11-26 DIAGNOSIS — S065XAA Traumatic subdural hemorrhage with loss of consciousness status unknown, initial encounter: Secondary | ICD-10-CM

## 2022-11-26 NOTE — Telephone Encounter (Signed)
okay for follow up in 3-4 wk with repeat head CT Received: Today Lovenia Kim, MD  [facility]-Neurosurgery Admin

## 2022-11-26 NOTE — Telephone Encounter (Signed)
Head CT has been ordered to be done in 3-4 weeks. I will send a message to scheduling.

## 2022-11-27 ENCOUNTER — Telehealth: Payer: Self-pay

## 2022-11-27 ENCOUNTER — Other Ambulatory Visit: Payer: Self-pay | Admitting: Family Medicine

## 2022-11-27 DIAGNOSIS — E039 Hypothyroidism, unspecified: Secondary | ICD-10-CM

## 2022-11-27 MED ORDER — LEVOTHYROXINE SODIUM 112 MCG PO TABS: 112.0000 ug | ORAL_TABLET | Freq: Every day | ORAL | 3 refills | Status: DC

## 2022-11-27 NOTE — Telephone Encounter (Signed)
Lvm for pts daughter to give office a call back,  See lab encounter for full details

## 2022-11-27 NOTE — Telephone Encounter (Signed)
-----   Message from [name] sent at 11/23/2022  4:11 PM EDT ----- Please let the patient know her TSH remains low.  This indicates she is getting too much thyroid medication.  This could be contributing to her tremor.  We need to reduce her dose of levothyroxine to 112 mcg daily and recheck her TSH in 6 weeks.  This can be sent to her pharmacy when she speak with her or her husband.

## 2022-11-27 NOTE — Telephone Encounter (Signed)
[facility] sent me this on Monday: I left message on 10/16 with no return call. I also called again today. The [phone] number just rings with no answer. I did leave another message on the [phone] requesting a return call to schedule.

## 2022-11-27 NOTE — Telephone Encounter (Signed)
Patient's daughter just called and I read her the message about her mom. She said she is okay with her mom reducing the levothyroxine to 112 mcg. She said she will let her husband know as well.

## 2022-11-28 ENCOUNTER — Telehealth: Payer: Self-pay | Admitting: Neurology

## 2022-11-28 NOTE — Telephone Encounter (Signed)
Pt's husband called stating that his wife was taken to the hospital due to having another seizure. He states that they referred her back to our office but is wanting to be seen sooner than what was offered past Jan. Please advise.

## 2022-11-28 NOTE — Telephone Encounter (Signed)
She had a fall, subdural bleed and possible seizure. Her depakote was increased from 250 mg BID to 375 TID. January appointment is reasonable.

## 2022-12-03 ENCOUNTER — Other Ambulatory Visit: Payer: PPO

## 2022-12-03 NOTE — Telephone Encounter (Signed)
Called pt to schedule a Jan appt but there was no answer. Will try pt again in a couple of hours.

## 2022-12-12 ENCOUNTER — Inpatient Hospital Stay: Payer: PPO | Attending: Oncology

## 2022-12-12 DIAGNOSIS — Z8041 Family history of malignant neoplasm of ovary: Secondary | ICD-10-CM | POA: Diagnosis not present

## 2022-12-12 DIAGNOSIS — Z79899 Other long term (current) drug therapy: Secondary | ICD-10-CM | POA: Diagnosis not present

## 2022-12-12 DIAGNOSIS — Z95828 Presence of other vascular implants and grafts: Secondary | ICD-10-CM

## 2022-12-12 DIAGNOSIS — Z9221 Personal history of antineoplastic chemotherapy: Secondary | ICD-10-CM | POA: Insufficient documentation

## 2022-12-12 DIAGNOSIS — Z87891 Personal history of nicotine dependence: Secondary | ICD-10-CM | POA: Insufficient documentation

## 2022-12-12 DIAGNOSIS — Z79890 Hormone replacement therapy (postmenopausal): Secondary | ICD-10-CM | POA: Insufficient documentation

## 2022-12-12 DIAGNOSIS — C7951 Secondary malignant neoplasm of bone: Secondary | ICD-10-CM | POA: Diagnosis not present

## 2022-12-12 DIAGNOSIS — Z452 Encounter for adjustment and management of vascular access device: Secondary | ICD-10-CM | POA: Insufficient documentation

## 2022-12-12 DIAGNOSIS — E063 Autoimmune thyroiditis: Secondary | ICD-10-CM | POA: Insufficient documentation

## 2022-12-12 DIAGNOSIS — F0393 Unspecified dementia, unspecified severity, with mood disturbance: Secondary | ICD-10-CM | POA: Diagnosis not present

## 2022-12-12 DIAGNOSIS — Z923 Personal history of irradiation: Secondary | ICD-10-CM | POA: Insufficient documentation

## 2022-12-12 DIAGNOSIS — C3411 Malignant neoplasm of upper lobe, right bronchus or lung: Secondary | ICD-10-CM | POA: Insufficient documentation

## 2022-12-12 MED ORDER — HEPARIN SOD (PORK) LOCK FLUSH 100 UNIT/ML IV SOLN
500.0000 [IU] | Freq: Once | INTRAVENOUS | Status: AC
Start: 2022-12-12 — End: 2022-12-12
  Administered 2022-12-12: 500 [IU] via INTRAVENOUS
  Filled 2022-12-12: qty 5

## 2022-12-12 MED ORDER — SODIUM CHLORIDE 0.9% FLUSH
10.0000 mL | Freq: Once | INTRAVENOUS | Status: AC
Start: 2022-12-12 — End: 2022-12-12
  Administered 2022-12-12: 10 mL via INTRAVENOUS
  Filled 2022-12-12: qty 10

## 2022-12-24 ENCOUNTER — Ambulatory Visit
Admission: RE | Admit: 2022-12-24 | Discharge: 2022-12-24 | Disposition: A | Discharge status: Home / Self Care | Payer: PPO | Source: Ambulatory Visit | Attending: Neurosurgery | Admitting: Neurosurgery

## 2022-12-24 DIAGNOSIS — I6523 Occlusion and stenosis of bilateral carotid arteries: Secondary | ICD-10-CM | POA: Diagnosis not present

## 2022-12-24 DIAGNOSIS — S065XAA Traumatic subdural hemorrhage with loss of consciousness status unknown, initial encounter: Secondary | ICD-10-CM | POA: Diagnosis not present

## 2022-12-24 DIAGNOSIS — I6782 Cerebral ischemia: Secondary | ICD-10-CM | POA: Diagnosis not present

## 2022-12-24 DIAGNOSIS — I6200 Nontraumatic subdural hemorrhage, unspecified: Secondary | ICD-10-CM | POA: Diagnosis not present

## 2022-12-25 DIAGNOSIS — H353211 Exudative age-related macular degeneration, right eye, with active choroidal neovascularization: Secondary | ICD-10-CM | POA: Diagnosis not present

## 2022-12-25 DIAGNOSIS — Z961 Presence of intraocular lens: Secondary | ICD-10-CM | POA: Diagnosis not present

## 2022-12-25 DIAGNOSIS — H353131 Nonexudative age-related macular degeneration, bilateral, early dry stage: Secondary | ICD-10-CM | POA: Diagnosis not present

## 2022-12-26 NOTE — Progress Notes (Signed)
Referring Physician:  Glori Luis, MD [address] [place],  [name] [address]  Primary Physician:  Glori Luis, MD  History of Present Illness: 12/31/2022 Anne [name] is here today with a chief complaint of a Subdural hematoma that was found on a scan.  She does have a history of headaches, however she states that her headaches have improved significantly.  She was having some dizziness and vision changes.  She also has some memory issues at baseline.  Was previously diagnosed with dementia.  Also had a known worsening of her falls.  Has been followed by neurology.  Has not had any new symptoms since a fall.  Her family is concerned that she may be having continued worsening with her ambulation.  She is also had a previous question of whether or not she is had a seizure.  For that she is reached out for a updated appointment with neurology.  Looks as though they are attempting to reach out to her but were unable to get a hold of her or her husband. She is not having any focal weakness.  I have utilized the care everywhere function in epic to review the outside records available from external health systems.  Review of Systems:  A 10 point review of systems is negative, except for the pertinent positives and negatives detailed in the HPI.  Past Medical History: Past Medical History:  Diagnosis Date   Arthritis    Cirrhosis (HCC)    Dementia (HCC)    Depression    GERD (gastroesophageal reflux disease)    Hyperlipidemia    Hypertension    Lung cancer (HCC)    Metastatic bone cancer     Past Surgical History: Past Surgical History:  Procedure Laterality Date   APPENDECTOMY  1971   CHOLECYSTECTOMY  1971   COLONOSCOPY WITH PROPOFOL N/A 11/15/2016   Procedure: COLONOSCOPY WITH PROPOFOL;  Surgeon: Wyline Mood, MD;  Location: [facility] ENDOSCOPY;  Service: Gastroenterology;  Laterality: N/A;   ENDOBRONCHIAL ULTRASOUND Right 07/30/2018   Procedure:  ENDOBRONCHIAL ULTRASOUND;  Surgeon: Shane Crutch, MD;  Location: [facility] ORS;  Service: Pulmonary;  Laterality: Right;   ESOPHAGOGASTRODUODENOSCOPY (EGD) WITH PROPOFOL N/A 01/07/2017   Procedure: ESOPHAGOGASTRODUODENOSCOPY (EGD) WITH PROPOFOL;  Surgeon: Wyline Mood, MD;  Location: [facility] ENDOSCOPY;  Service: Gastroenterology;  Laterality: N/A;   LAPAROSCOPY N/A 03/01/2017   Procedure: LAPAROSCOPY DIAGNOSTIC;  Surgeon: Earline Mayotte, MD;  Location: [facility] ORS;  Service: General;  Laterality: N/A;   PORTA CATH INSERTION N/A 08/14/2018   Procedure: PORTA CATH INSERTION;  Surgeon: Annice Needy, MD;  Location: [facility] INVASIVE CV LAB;  Service: Cardiovascular;  Laterality: N/A;   TONSILECTOMY, ADENOIDECTOMY, BILATERAL MYRINGOTOMY AND TUBES  1955   TONSILLECTOMY     VENTRAL HERNIA REPAIR N/A 03/01/2017   10 x 14 CM Ventralight ST mesh, intraperitoneal location.    VENTRAL HERNIA REPAIR N/A 03/01/2017   Procedure: HERNIA REPAIR VENTRAL ADULT;  Surgeon: Earline Mayotte, MD;  Location: [facility] ORS;  Service: General;  Laterality: N/A;    Allergies: Allergies as of 12/31/2022 - Review Complete 12/31/2022  Allergen Reaction Noted   Bee venom Swelling 03/01/2017    Medications:  Current Outpatient Medications:    amLODipine (NORVASC) 5 MG tablet, Take 1 tablet by mouth once daily, Disp: 90 tablet, Rfl: 0   buPROPion (WELLBUTRIN XL) 300 MG 24 hr tablet, Take 1 tablet by mouth once daily, Disp: 90 tablet, Rfl: 0   divalproex (DEPAKOTE) 125 MG  DR tablet, Take 3 tablets (375 mg total) by mouth 3 (three) times daily., Disp: 270 tablet, Rfl: 2   levothyroxine (SYNTHROID) 112 MCG tablet, Take 1 tablet (112 mcg total) by mouth daily., Disp: 90 tablet, Rfl: 3   memantine (NAMENDA) 10 MG tablet, Take 1 tablet (10 mg total) by mouth 2 (two) times daily., Disp: 60 tablet, Rfl: 11   Multiple Vitamin (MULTIVITAMIN) tablet, Take 1 tablet by mouth daily., Disp: , Rfl:    QUEtiapine (SEROQUEL) 25 MG tablet, Take 0.5  tablets (12.5 mg total) by mouth 2 (two) times daily., Disp: 30 tablet, Rfl: 11   rivastigmine (EXELON) 3 MG capsule, Take 1 capsule (3 mg total) by mouth 2 (two) times daily., Disp: 60 capsule, Rfl: 11   rosuvastatin (CRESTOR) 20 MG tablet, Take 1 tablet (20 mg total) by mouth daily., Disp: 90 tablet, Rfl: 3   sertraline (ZOLOFT) 100 MG tablet, TAKE 1 & 1/2 (ONE & ONE-HALF) TABLETS BY MOUTH ONCE DAILY, Disp: 135 tablet, Rfl: 0 No current facility-administered medications for this visit.  Facility-Administered Medications Ordered in Other Visits:    denosumab (XGEVA) injection 120 mg, 120 mg, Subcutaneous, Q30 days, Creig Hines, MD, 120 mg at 03/05/19 1525   heparin lock flush 100 UNIT/ML injection, , , ,   Social History: Social History   Tobacco Use   Smoking status: Former    Current packs/day: 0.00    Average packs/day: 2.0 packs/day for 50.0 years (100.0 ttl pk-yrs)    Types: Cigarettes, E-cigarettes    Start date: 02/06/1962    Quit date: 02/07/2012    Years since quitting: 10.9   Smokeless tobacco: Never  Vaping Use   Vaping status: Former   Start date: 10/06/2013   Devices: uses no liquid  Substance Use Topics   Alcohol use: No    Alcohol/week: 0.0 standard drinks of alcohol   Drug use: No    Family Medical History: Family History  Problem Relation Age of Onset   Hypertension Mother    Ovarian cancer Mother 29   Dementia Mother    Heart disease Father    Stroke Father    Seizures Father    Ovarian cancer Sister        ? dx cancer had hyst.   Breast cancer Neg Hx     Physical Examination: Vitals:   12/31/22 1059  BP: 124/84    General: Patient is in no apparent distress. Attention to examination is appropriate.  Neck:   Supple.  Full range of motion.  Respiratory: Patient is breathing without any difficulty.   NEUROLOGICAL:     Her speech is fluent, some tremulousness in her voice.  She is awake and alert.  She is oriented to person.  Does have some  easy distractibility and tangentiality.  Cranial Nerves: Pupils equal round and reactive to light.  Facial tone is symmetric.  Facial sensation is symmetric. Shoulder shrug is symmetric. Tongue protrusion is midline.    Strength: No evidence of pronator drift with outstretched arms.  No major motor deficits noted.  Bilateral upper and lower extremity sensation is intact to light touch .   Imaging: Awaiting final read however no evidence of worsening of her CT appearance of her subdural hematomas.  No evidence of mass effect.  I have personally reviewed the images and agree with the above interpretation.  Medical Decision Making/Assessment and Plan: Ms. Smilowitz is a pleasant 72 y.o. female with history of a recent fall with a subdural  hematoma.  She has had an improvement in her subdural hematoma on imaging.  She does have a history of worsening dementia and was previously seen by our neurology team.  It does appear that since the fall they have reached out to reestablish an earlier appointment for her given the concern for possible seizures.  Also worsening dementia per her family and as well as worsening ambulation issues.  I spoke with the daughter to help coordinate a follow-up appointment.  She gave [place] the number of herself and her sister who will can help with coordination of her care.  From a subdural hematoma standpoint she has no evidence of worsening compression, CT scan appears to be improved.  Will await final read.  At this point we do not feel that her subdural hematoma is causing any active issues.  We have reached out to neurology to help coordinate her care.  We have also reached out to her primary care provider to help with evaluation for possible placement as there is some concern that she may not be tolerating being at home as much as previously thought.  I spent 45 minutes with this patient discussing her care, coordinating her care, reviewing her imaging, direct patient  evaluation, and discussion with family for ongoing planning.There are no diagnoses linked to this encounter.   Thank you for involving me in the care of this patient.    Lovenia Kim MD/MSCR Neurosurgery

## 2022-12-31 ENCOUNTER — Encounter: Payer: Self-pay | Admitting: Neurosurgery

## 2022-12-31 ENCOUNTER — Ambulatory Visit (INDEPENDENT_AMBULATORY_CARE_PROVIDER_SITE_OTHER): Payer: PPO | Admitting: Neurosurgery

## 2022-12-31 VITALS — BP 124/84 | Ht 68.0 in | Wt 144.0 lb

## 2022-12-31 DIAGNOSIS — S065XAA Traumatic subdural hemorrhage with loss of consciousness status unknown, initial encounter: Secondary | ICD-10-CM

## 2022-12-31 DIAGNOSIS — R296 Repeated falls: Secondary | ICD-10-CM | POA: Diagnosis not present

## 2022-12-31 DIAGNOSIS — R569 Unspecified convulsions: Secondary | ICD-10-CM

## 2023-01-09 ENCOUNTER — Other Ambulatory Visit (INDEPENDENT_AMBULATORY_CARE_PROVIDER_SITE_OTHER): Payer: PPO

## 2023-01-09 DIAGNOSIS — E039 Hypothyroidism, unspecified: Secondary | ICD-10-CM

## 2023-01-09 LAB — TSH: TSH: 0.1 u[IU]/mL — ABNORMAL LOW (ref 0.35–5.50)

## 2023-01-15 ENCOUNTER — Telehealth: Payer: Self-pay

## 2023-01-15 ENCOUNTER — Other Ambulatory Visit: Payer: Self-pay | Admitting: Family Medicine

## 2023-01-15 DIAGNOSIS — E039 Hypothyroidism, unspecified: Secondary | ICD-10-CM

## 2023-01-15 MED ORDER — LEVOTHYROXINE SODIUM 88 MCG PO TABS: 88.0000 ug | ORAL_TABLET | Freq: Every day | ORAL | 0 refills | Status: DC
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
## 2023-01-15 NOTE — Telephone Encounter (Signed)
Pt daughter called back and I read the message to her and she verbalized understanding

## 2023-01-15 NOTE — Telephone Encounter (Signed)
-----   Message from [name] sent at 01/15/2023 11:06 AM EST ----- Noted.  I would like to reduce her levothyroxine to 88 mcg daily.  Prescription sent to pharmacy.  She needs labs rechecked in 6 weeks.  Orders placed.
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
## 2023-01-16 NOTE — Telephone Encounter (Signed)
I am not aware of any safety concerns for this patient. It may be worth reaching back out to the patient's daughter to get her insight on this encounter.

## 2023-01-16 NOTE — Telephone Encounter (Signed)
My concern is would it be better if we call the daughter with results.

## 2023-01-16 NOTE — Telephone Encounter (Signed)
Left daughter on DPR a message to call back regarding her mother.
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
## 2023-01-19 ENCOUNTER — Other Ambulatory Visit: Payer: Self-pay

## 2023-01-19 ENCOUNTER — Emergency Department
Admission: EM | Admit: 2023-01-19 | Discharge: 2023-01-19 | Disposition: A | Discharge status: Home / Self Care | Payer: PPO | Attending: Emergency Medicine | Admitting: Emergency Medicine

## 2023-01-19 ENCOUNTER — Emergency Department: Payer: PPO

## 2023-01-19 DIAGNOSIS — S065X0A Traumatic subdural hemorrhage without loss of consciousness, initial encounter: Secondary | ICD-10-CM | POA: Insufficient documentation

## 2023-01-19 DIAGNOSIS — M4856XA Collapsed vertebra, not elsewhere classified, lumbar region, initial encounter for fracture: Secondary | ICD-10-CM | POA: Diagnosis not present

## 2023-01-19 DIAGNOSIS — S066X0A Traumatic subarachnoid hemorrhage without loss of consciousness, initial encounter: Secondary | ICD-10-CM | POA: Diagnosis not present

## 2023-01-19 DIAGNOSIS — S0003XA Contusion of scalp, initial encounter: Secondary | ICD-10-CM | POA: Diagnosis not present

## 2023-01-19 DIAGNOSIS — W01198A Fall on same level from slipping, tripping and stumbling with subsequent striking against other object, initial encounter: Secondary | ICD-10-CM | POA: Insufficient documentation

## 2023-01-19 DIAGNOSIS — S065XAA Traumatic subdural hemorrhage with loss of consciousness status unknown, initial encounter: Secondary | ICD-10-CM

## 2023-01-19 DIAGNOSIS — S069X9A Unspecified intracranial injury with loss of consciousness of unspecified duration, initial encounter: Secondary | ICD-10-CM | POA: Diagnosis not present

## 2023-01-19 DIAGNOSIS — I609 Nontraumatic subarachnoid hemorrhage, unspecified: Secondary | ICD-10-CM | POA: Insufficient documentation

## 2023-01-19 DIAGNOSIS — Z85118 Personal history of other malignant neoplasm of bronchus and lung: Secondary | ICD-10-CM | POA: Insufficient documentation

## 2023-01-19 DIAGNOSIS — M4312 Spondylolisthesis, cervical region: Secondary | ICD-10-CM | POA: Diagnosis not present

## 2023-01-19 DIAGNOSIS — J432 Centrilobular emphysema: Secondary | ICD-10-CM | POA: Diagnosis not present

## 2023-01-19 DIAGNOSIS — W19XXXA Unspecified fall, initial encounter: Secondary | ICD-10-CM

## 2023-01-19 MED ORDER — LEVETIRACETAM 500 MG PO TABS: 500.0000 mg | ORAL_TABLET | Freq: Two times a day (BID) | ORAL | 0 refills | Status: DC

## 2023-01-19 MED ORDER — LEVETIRACETAM 500 MG PO TABS
500.0000 mg | ORAL_TABLET | Freq: Once | ORAL | Status: AC
  Administered 2023-01-19: 500 mg via ORAL
  Filled 2023-01-19: qty 1

## 2023-01-19 NOTE — ED Provider Notes (Signed)
[facility] Provider Note    Event Date/Time   First MD Initiated Contact with Patient 01/19/23 [phone]     (approximate)   History   Fall   HPI  Anne [name] is a 72 y.o. female who presents to the ED for evaluation of Fall   I review a neurology clinic visit from October.  Patient history of lung cancer and memory decline.  No anticoagulation.  She presents with her husband after a fall.  He provides majority of history considering her memory issues.  Reports that she got up to void overnight tripped over a coffee table and struck her head.  Husband reports that she was unconscious for a couple minutes before resuming consciousness.   Physical Exam   Triage Vital Signs: ED Triage Vitals  Encounter Vitals Group     BP 01/19/23 0455 (!) 143/91     Systolic BP Percentile --      Diastolic BP Percentile --      Pulse Rate 01/19/23 0455 86     Resp 01/19/23 0455 18     Temp 01/19/23 0455 97.9 F (36.6 C)     Temp Source 01/19/23 0455 Oral     SpO2 01/19/23 0455 91 %     Weight 01/19/23 0455 163 lb (73.9 kg)     Height 01/19/23 0455 5\' 8"  (1.727 m)     Head Circumference --      Peak Flow --      Pain Score 01/19/23 0456 5     Pain Loc --      Pain Education --      Exclude from Growth Chart --     Most recent vital signs: Vitals:   01/19/23 0455  BP: (!) 143/91  Pulse: 86  Resp: 18  Temp: 97.9 F (36.6 C)  SpO2: 91%    General: Awake, no distress.  CV:  Good peripheral perfusion.  Resp:  Normal effort.  Abd:  No distention.  MSK:  No deformity noted.  Neuro:  No focal deficits appreciated. Other:  Hematoma and abrasion to the left brow.  She scraped off some epidermis but no discrete laceration to require repair.   ED Results / Procedures / Treatments   Labs (all labs ordered are listed, but only abnormal results are displayed) Labs Reviewed - No data to display  EKG   RADIOLOGY CT of the head interpreted by me with  small subdural and subarachnoid without midline shift CT cervical spine interpreted by me without evidence of fracture or dislocation  Official radiology report(s): CT Head Wo Contrast Addendum Date: 01/19/2023 ADDENDUM REPORT: 01/19/2023 05:47 ADDENDUM: Study discussed by telephone with Dr. Lesly Rubenstein SUNG on 01/19/2023 at 0524 hours. Electronically Signed   By: Odessa Fleming M.D.   On: 01/19/2023 05:47   Result Date: 01/19/2023 CLINICAL DATA:  72 year old female status post slip and fall in bathroom. Struck head on furniture. Positive loss of consciousness. EXAM: CT HEAD WITHOUT CONTRAST TECHNIQUE: Contiguous axial images were obtained from the base of the skull through the vertex without intravenous contrast. RADIATION DOSE REDUCTION: This exam was performed according to the departmental dose-optimization program which includes automated exposure control, adjustment of the mA and/or kV according to patient size and/or use of iterative reconstruction technique. COMPARISON:  Brain MRI 06/21/2021.  Head CT 12/24/2022. FINDINGS: Brain: Hyperdense left side subdural hematoma is new from last month and measures 3-4 mm maximum thickness along the left lateral convexity. No IVH. But  there is trace anterior cranial fossa subarachnoid hemorrhage (coronal image 21) which is small volume but could be bilateral. On sagittal image 20 the right subfrontal hyperdense blood is more broad-based and could be a separate small right anterior cranial fossa subdural hematoma. Hemorrhagic contusion felt less likely. Definite SAH tracking in the left inferior frontal gyrus sulci posteriorly (coronal image 28). No other right side subdural hematoma. No midline shift. No ventriculomegaly. Basilar cisterns remain normal. Stable gray-white matter differentiation throughout the brain. No cortically based acute infarct identified. Vascular: No suspicious intracranial vascular hyperdensity. Skull: Stable.  No fracture identified. Sinuses/Orbits:  Visualized paranasal sinuses and mastoids are stable and well aerated. Other: Left anterior forehead, scalp convexity hematoma measures up to 11 mm in thickness and is new from last month. Second broad-based left superolateral vertex scalp hematoma series 3, image 57. And less pronounced more broad-based right lateral scalp edema or contusion. Underlying calvarium appears stable and intact. Orbits soft tissues appears stable and within normal limits. Traumatic Brain Injury Risk Stratification Skull Fracture: No - Low/mBIG 1 Subdural Hematoma (SDH): 4mm to <80mm - mBIG 2 Subarachnoid Hemorrhage [facility]): localized, unilateral - Moderate/mBIG 2 Epidural Hematoma (EDH): No - Low/mBIG 1 Cerebral contusion, intra-axial, intraparenchymal Hemorrhage (IPH): No Intraventricular Hemorrhage (IVH): No - Low/mBIG 1 Midline Shift > 1mm or Edema/effacement of sulci/vents: No - Low/mBIG 1 ---------------------------------------------------- IMPRESSION: 1. Positive for posttraumatic intracranial hemorrhage: - small acute Left Subdural Hematoma, 3-4 mm maximum thickness. - small volume anterior cranial fossa subarachnoid hemorrhage, more so on the left. - right anterior cranial fossa small volume SAH versus small localized SDH (sagittal image 20), or less likely hemorrhagic contusion. 2. No midline shift or significant intracranial mass effect at this time. 3. No skull fracture identified. Multifocal bilateral scalp hematomas, largest on the left forehead. Electronically Signed: By: Odessa Fleming M.D. On: 01/19/2023 05:22   CT Cervical Spine Wo Contrast Result Date: 01/19/2023 CLINICAL DATA:  72 year old female status post slip and fall in bathroom. Struck head on furniture. Positive loss of consciousness. EXAM: CT CERVICAL SPINE WITHOUT CONTRAST TECHNIQUE: Multidetector CT imaging of the cervical spine was performed without intravenous contrast. Multiplanar CT image reconstructions were also generated. RADIATION DOSE REDUCTION: This exam  was performed according to the departmental dose-optimization program which includes automated exposure control, adjustment of the mA and/or kV according to patient size and/or use of iterative reconstruction technique. COMPARISON:  Head CT today.  Cervical spine CT 07/22/2021. FINDINGS: Alignment: Stable. Mild straightening of cervical lordosis and mild degenerative appearing upper cervical spondylolisthesis. Cervicothoracic junction alignment is within normal limits. Bilateral posterior element alignment is within normal limits. Skull base and vertebrae: Bone mineralization is within normal limits. Visualized skull base is intact. No atlanto-occipital dissociation. C1 and C2 appear intact and aligned. No acute osseous abnormality identified. Soft tissues and spinal canal: No prevertebral fluid or swelling. No visible canal hematoma. Chronic right chest porta calf is stable. Retropharyngeal course of the carotids, especially the left. Otherwise negative noncontrast visible neck soft tissues. Disc levels: Stable chronic cervical spine degeneration, maximal at C4-C5 and C5-C6 but fairly age-appropriate elsewhere. Upper chest: Chronic T2 thoracic compression fracture. Visible upper thoracic levels appears stable. Centrilobular emphysema in the upper lungs which are otherwise clear. IMPRESSION: 1. No acute traumatic injury identified in the cervical spine. 2. Stable chronic cervical spine degeneration. 3.  Emphysema (ICD10-J43.9). Electronically Signed   By: Odessa Fleming M.D.   On: 01/19/2023 05:27    PROCEDURES and INTERVENTIONS:  .Critical Care  Performed by: Delton Prairie, MD Authorized by: Delton Prairie, MD   Critical care provider statement:    Critical care time (minutes):  30   Critical care time was exclusive of:  Separately billable procedures and treating other patients   Critical care was necessary to treat or prevent imminent or life-threatening deterioration of the following conditions:  CNS failure  or compromise   Critical care was time spent personally by me on the following activities:  Development of treatment plan with patient or surrogate, discussions with consultants, evaluation of patient's response to treatment, examination of patient, ordering and review of laboratory studies, ordering and review of radiographic studies, ordering and performing treatments and interventions, pulse oximetry, re-evaluation of patient's condition and review of old charts   Medications  levETIRAcetam (KEPPRA) tablet 500 mg (500 mg Oral Given 01/19/23 0616)     IMPRESSION / MDM / ASSESSMENT AND PLAN / ED COURSE  I reviewed the triage vital signs and the nursing notes.  Differential diagnosis includes, but is not limited to, skull fracture, ICH, seizure, stroke, cardiac dysrhythmia  {Patient presents with symptoms of an acute illness or injury that is potentially life-threatening.  Patient not on anticoagulation presents after mechanical fall with a traumatic subdural and subarachnoid.  She is GCS 15, neuro intact to look systemically well.  Pleasantly demented at baseline.  Has an abrasion on her forehead but nothing requiring repair.  I consulted neurosurgery who recommends prophylactic Keppra, repeat imaging after 6 hours and possible outpatient management pending this clinical course.  She is signed out to oncoming morning physician to facilitate this  Clinical Course as of 01/19/23 0705  Sat Jan 19, 2023  0604 Secretary has called NSGY twice. No response [DS]  [id number] I consult with Dr. Allison Quarry. Repeat CT. 500keppra for a week.  [DS]    Clinical Course User Index [DS] Delton Prairie, MD     FINAL CLINICAL IMPRESSION(S) / ED DIAGNOSES   Final diagnoses:  Fall, initial encounter  SDH (subdural hematoma) (HCC)  SAH (subarachnoid hemorrhage) (HCC)     Rx / DC Orders   ED Discharge Orders     None        Note:  This document was prepared using Dragon voice recognition software and may  include unintentional dictation errors.   Delton Prairie, MD 01/19/23 [phone]

## 2023-01-19 NOTE — ED Provider Notes (Signed)
-----------------------------------------   12:00 PM on 01/19/2023 -----------------------------------------  Repeat CT shows no increase or worsening.  The patient appears comfortable on reassessment.  She is stable for discharge home.  I counseled her on the results of the workup and plan of care.  I gave strict return precautions and she expressed understanding.  CT head:  IMPRESSION:  Similar versus slightly decreased multifocal/multicompartment  acute/recent hemorrhage, detailed above. No significant or  progressive mass effect.     Dionne Bucy, MD 01/19/23 1201

## 2023-01-19 NOTE — ED Triage Notes (Signed)
Pt wearing socks while in bathroom tonight and slipped on slick floor. Pt fell and hit her head on a piece of furniture. +LOC. Hematoma noted to L forehead. Laceration to L eyebrow. Pt not on daily thinners. Alert and oriented on arrival. Breathing unlabored speaking in full sentences.

## 2023-01-19 NOTE — Discharge Instructions (Signed)
You are being discharged on Keppra antiseizure medicine to prevent any seizures caused by irritation of the brain by this blood.  Take this medication just for 1 week  Follow-up with the neurosurgeons in the clinic.  They should have your information and reach out to you to schedule this appointment.  If you do not hear from them, please call the attached number

## 2023-01-22 ENCOUNTER — Telehealth: Payer: Self-pay

## 2023-01-22 ENCOUNTER — Emergency Department (HOSPITAL_COMMUNITY): Payer: PPO

## 2023-01-22 ENCOUNTER — Other Ambulatory Visit: Payer: Self-pay

## 2023-01-22 ENCOUNTER — Inpatient Hospital Stay (HOSPITAL_COMMUNITY)
Admission: EM | Admit: 2023-01-22 | Discharge: 2023-02-18 | DRG: 082 | Disposition: A | Discharge status: Home Health | Payer: PPO | Attending: Internal Medicine | Admitting: Internal Medicine

## 2023-01-22 ENCOUNTER — Encounter (HOSPITAL_COMMUNITY): Payer: Self-pay

## 2023-01-22 ENCOUNTER — Encounter (HOSPITAL_COMMUNITY): Payer: PPO

## 2023-01-22 DIAGNOSIS — I959 Hypotension, unspecified: Secondary | ICD-10-CM | POA: Diagnosis not present

## 2023-01-22 DIAGNOSIS — G40909 Epilepsy, unspecified, not intractable, without status epilepticus: Secondary | ICD-10-CM | POA: Diagnosis not present

## 2023-01-22 DIAGNOSIS — I6202 Nontraumatic subacute subdural hemorrhage: Secondary | ICD-10-CM | POA: Diagnosis not present

## 2023-01-22 DIAGNOSIS — F32A Depression, unspecified: Secondary | ICD-10-CM | POA: Diagnosis present

## 2023-01-22 DIAGNOSIS — E785 Hyperlipidemia, unspecified: Secondary | ICD-10-CM | POA: Diagnosis present

## 2023-01-22 DIAGNOSIS — R338 Other retention of urine: Secondary | ICD-10-CM | POA: Diagnosis not present

## 2023-01-22 DIAGNOSIS — S01112A Laceration without foreign body of left eyelid and periocular area, initial encounter: Secondary | ICD-10-CM | POA: Diagnosis not present

## 2023-01-22 DIAGNOSIS — Z9181 History of falling: Secondary | ICD-10-CM | POA: Diagnosis not present

## 2023-01-22 DIAGNOSIS — S0101XA Laceration without foreign body of scalp, initial encounter: Secondary | ICD-10-CM | POA: Diagnosis not present

## 2023-01-22 DIAGNOSIS — S0003XA Contusion of scalp, initial encounter: Secondary | ICD-10-CM | POA: Diagnosis not present

## 2023-01-22 DIAGNOSIS — K649 Unspecified hemorrhoids: Secondary | ICD-10-CM | POA: Diagnosis not present

## 2023-01-22 DIAGNOSIS — H9190 Unspecified hearing loss, unspecified ear: Secondary | ICD-10-CM | POA: Diagnosis not present

## 2023-01-22 DIAGNOSIS — Z79899 Other long term (current) drug therapy: Secondary | ICD-10-CM

## 2023-01-22 DIAGNOSIS — F03918 Unspecified dementia, unspecified severity, with other behavioral disturbance: Secondary | ICD-10-CM | POA: Diagnosis present

## 2023-01-22 DIAGNOSIS — Z91030 Bee allergy status: Secondary | ICD-10-CM

## 2023-01-22 DIAGNOSIS — S065X0A Traumatic subdural hemorrhage without loss of consciousness, initial encounter: Secondary | ICD-10-CM | POA: Diagnosis not present

## 2023-01-22 DIAGNOSIS — F03A18 Unspecified dementia, mild, with other behavioral disturbance: Secondary | ICD-10-CM | POA: Diagnosis not present

## 2023-01-22 DIAGNOSIS — E039 Hypothyroidism, unspecified: Secondary | ICD-10-CM | POA: Diagnosis present

## 2023-01-22 DIAGNOSIS — R339 Retention of urine, unspecified: Secondary | ICD-10-CM | POA: Diagnosis not present

## 2023-01-22 DIAGNOSIS — I6203 Nontraumatic chronic subdural hemorrhage: Secondary | ICD-10-CM | POA: Diagnosis not present

## 2023-01-22 DIAGNOSIS — K219 Gastro-esophageal reflux disease without esophagitis: Secondary | ICD-10-CM | POA: Diagnosis present

## 2023-01-22 DIAGNOSIS — R404 Transient alteration of awareness: Secondary | ICD-10-CM | POA: Diagnosis not present

## 2023-01-22 DIAGNOSIS — R001 Bradycardia, unspecified: Secondary | ICD-10-CM | POA: Diagnosis not present

## 2023-01-22 DIAGNOSIS — D696 Thrombocytopenia, unspecified: Secondary | ICD-10-CM | POA: Diagnosis not present

## 2023-01-22 DIAGNOSIS — Y92230 Patient room in hospital as the place of occurrence of the external cause: Secondary | ICD-10-CM | POA: Diagnosis not present

## 2023-01-22 DIAGNOSIS — R569 Unspecified convulsions: Secondary | ICD-10-CM | POA: Diagnosis not present

## 2023-01-22 DIAGNOSIS — S0083XA Contusion of other part of head, initial encounter: Secondary | ICD-10-CM | POA: Diagnosis not present

## 2023-01-22 DIAGNOSIS — S065XAA Traumatic subdural hemorrhage with loss of consciousness status unknown, initial encounter: Secondary | ICD-10-CM | POA: Diagnosis not present

## 2023-01-22 DIAGNOSIS — F03B3 Unspecified dementia, moderate, with mood disturbance: Secondary | ICD-10-CM | POA: Diagnosis present

## 2023-01-22 DIAGNOSIS — I10 Essential (primary) hypertension: Secondary | ICD-10-CM | POA: Diagnosis present

## 2023-01-22 DIAGNOSIS — S0512XA Contusion of eyeball and orbital tissues, left eye, initial encounter: Secondary | ICD-10-CM | POA: Diagnosis not present

## 2023-01-22 DIAGNOSIS — W01190A Fall on same level from slipping, tripping and stumbling with subsequent striking against furniture, initial encounter: Secondary | ICD-10-CM | POA: Diagnosis present

## 2023-01-22 DIAGNOSIS — S065XAD Traumatic subdural hemorrhage with loss of consciousness status unknown, subsequent encounter: Secondary | ICD-10-CM | POA: Diagnosis not present

## 2023-01-22 DIAGNOSIS — H1132 Conjunctival hemorrhage, left eye: Secondary | ICD-10-CM | POA: Diagnosis present

## 2023-01-22 DIAGNOSIS — Z79890 Hormone replacement therapy (postmenopausal): Secondary | ICD-10-CM

## 2023-01-22 DIAGNOSIS — Z7189 Other specified counseling: Secondary | ICD-10-CM | POA: Diagnosis not present

## 2023-01-22 DIAGNOSIS — Z9049 Acquired absence of other specified parts of digestive tract: Secondary | ICD-10-CM

## 2023-01-22 DIAGNOSIS — C7951 Secondary malignant neoplasm of bone: Secondary | ICD-10-CM | POA: Diagnosis not present

## 2023-01-22 DIAGNOSIS — I6200 Nontraumatic subdural hemorrhage, unspecified: Secondary | ICD-10-CM | POA: Diagnosis not present

## 2023-01-22 DIAGNOSIS — E119 Type 2 diabetes mellitus without complications: Secondary | ICD-10-CM | POA: Diagnosis not present

## 2023-01-22 DIAGNOSIS — R296 Repeated falls: Secondary | ICD-10-CM | POA: Diagnosis not present

## 2023-01-22 DIAGNOSIS — Z87891 Personal history of nicotine dependence: Secondary | ICD-10-CM

## 2023-01-22 DIAGNOSIS — I6201 Nontraumatic acute subdural hemorrhage: Secondary | ICD-10-CM | POA: Diagnosis not present

## 2023-01-22 DIAGNOSIS — Z66 Do not resuscitate: Secondary | ICD-10-CM | POA: Diagnosis not present

## 2023-01-22 DIAGNOSIS — Z85118 Personal history of other malignant neoplasm of bronchus and lung: Secondary | ICD-10-CM

## 2023-01-22 DIAGNOSIS — S06A1XA Traumatic brain compression with herniation, initial encounter: Secondary | ICD-10-CM | POA: Diagnosis not present

## 2023-01-22 DIAGNOSIS — H5509 Other forms of nystagmus: Secondary | ICD-10-CM | POA: Diagnosis present

## 2023-01-22 DIAGNOSIS — Z87820 Personal history of traumatic brain injury: Secondary | ICD-10-CM

## 2023-01-22 DIAGNOSIS — R58 Hemorrhage, not elsewhere classified: Secondary | ICD-10-CM | POA: Diagnosis not present

## 2023-01-22 DIAGNOSIS — K7460 Unspecified cirrhosis of liver: Secondary | ICD-10-CM | POA: Diagnosis not present

## 2023-01-22 DIAGNOSIS — F0390 Unspecified dementia without behavioral disturbance: Secondary | ICD-10-CM | POA: Diagnosis not present

## 2023-01-22 DIAGNOSIS — W06XXXA Fall from bed, initial encounter: Secondary | ICD-10-CM | POA: Diagnosis not present

## 2023-01-22 DIAGNOSIS — Z751 Person awaiting admission to adequate facility elsewhere: Secondary | ICD-10-CM

## 2023-01-22 DIAGNOSIS — I672 Cerebral atherosclerosis: Secondary | ICD-10-CM | POA: Diagnosis not present

## 2023-01-22 DIAGNOSIS — Z515 Encounter for palliative care: Secondary | ICD-10-CM | POA: Diagnosis not present

## 2023-01-22 DIAGNOSIS — R0902 Hypoxemia: Secondary | ICD-10-CM | POA: Diagnosis not present

## 2023-01-22 DIAGNOSIS — Z8249 Family history of ischemic heart disease and other diseases of the circulatory system: Secondary | ICD-10-CM

## 2023-01-22 LAB — CBC WITH DIFFERENTIAL/PLATELET
Abs Immature Granulocytes: 0.01 10*3/uL (ref 0.00–0.07)
Basophils Absolute: 0 10*3/uL (ref 0.0–0.1)
Basophils Relative: 1 %
Eosinophils Absolute: 0 10*3/uL (ref 0.0–0.5)
Eosinophils Relative: 1 %
HCT: 38.2 % (ref 36.0–46.0)
Hemoglobin: 12.6 g/dL (ref 12.0–15.0)
Immature Granulocytes: 0 %
Lymphocytes Relative: 19 %
Lymphs Abs: 0.8 10*3/uL (ref 0.7–4.0)
MCH: 30.2 pg (ref 26.0–34.0)
MCHC: 33 g/dL (ref 30.0–36.0)
MCV: 91.6 fL (ref 80.0–100.0)
Monocytes Absolute: 0.4 10*3/uL (ref 0.1–1.0)
Monocytes Relative: 8 %
Neutro Abs: 3 10*3/uL (ref 1.7–7.7)
Neutrophils Relative %: 71 %
Platelets: 123 10*3/uL — ABNORMAL LOW (ref 150–400)
RBC: 4.17 MIL/uL (ref 3.87–5.11)
RDW: 15.2 % (ref 11.5–15.5)
WBC: 4.2 10*3/uL (ref 4.0–10.5)
nRBC: 0 % (ref 0.0–0.2)

## 2023-01-22 LAB — COMPREHENSIVE METABOLIC PANEL
ALT: 75 U/L — ABNORMAL HIGH (ref 0–44)
AST: 83 U/L — ABNORMAL HIGH (ref 15–41)
Albumin: 2.8 g/dL — ABNORMAL LOW (ref 3.5–5.0)
Alkaline Phosphatase: 142 U/L — ABNORMAL HIGH (ref 38–126)
Anion gap: 8 (ref 5–15)
BUN: 21 mg/dL (ref 8–23)
CO2: 28 mmol/L (ref 22–32)
Calcium: 8.8 mg/dL — ABNORMAL LOW (ref 8.9–10.3)
Chloride: 103 mmol/L (ref 98–111)
Creatinine, Ser: 0.99 mg/dL (ref 0.44–1.00)
GFR, Estimated: 60 mL/min — ABNORMAL LOW (ref >60)
Glucose, Bld: 90 mg/dL (ref 70–99)
Potassium: 3.3 mmol/L — ABNORMAL LOW (ref 3.5–5.1)
Sodium: 139 mmol/L (ref 135–145)
Total Bilirubin: 0.7 mg/dL (ref <1.2)
Total Protein: 5.5 g/dL — ABNORMAL LOW (ref 6.5–8.1)

## 2023-01-22 LAB — VALPROIC ACID LEVEL: Valproic Acid Lvl: 25 ug/mL — ABNORMAL LOW (ref 50.0–100.0)

## 2023-01-22 LAB — PROTIME-INR
INR: 1.2 (ref 0.8–1.2)
Prothrombin Time: 15.2 s (ref 11.4–15.2)

## 2023-01-22 LAB — CBG MONITORING, ED: Glucose-Capillary: 81 mg/dL (ref 70–99)

## 2023-01-22 MED ORDER — SODIUM CHLORIDE 0.9 % IV SOLN
750.0000 mg | Freq: Two times a day (BID) | INTRAVENOUS | Status: DC
  Filled 2023-01-22 (×2): qty 7.5

## 2023-01-22 MED ORDER — LEVETIRACETAM IN NACL 1000 MG/100ML IV SOLN
1000.0000 mg | Freq: Once | INTRAVENOUS | Status: AC
Start: 2023-01-22 — End: 2023-01-22
  Administered 2023-01-22: 1000 mg via INTRAVENOUS
  Filled 2023-01-22: qty 100

## 2023-01-22 MED ORDER — LORAZEPAM 2 MG/ML IJ SOLN: 2.0000 mg | Freq: Once | INTRAMUSCULAR | Status: AC

## 2023-01-22 MED ORDER — LABETALOL HCL 5 MG/ML IV SOLN
5.0000 mg | INTRAVENOUS | Status: DC | PRN
  Administered 2023-01-22 (×2): 5 mg via INTRAVENOUS
  Filled 2023-01-22: qty 4

## 2023-01-22 MED ORDER — LABETALOL HCL 5 MG/ML IV SOLN
10.0000 mg | INTRAVENOUS | Status: DC | PRN
  Administered 2023-01-22 – 2023-01-23 (×4): 10 mg via INTRAVENOUS
  Administered 2023-01-23: 5 mg via INTRAVENOUS
  Administered 2023-01-23 – 2023-01-24 (×2): 10 mg via INTRAVENOUS
  Filled 2023-01-22 (×7): qty 4

## 2023-01-22 MED ORDER — SODIUM CHLORIDE 0.9 % IV SOLN: 2000.0000 mg | Freq: Once | INTRAVENOUS | Status: DC

## 2023-01-22 MED ORDER — LEVETIRACETAM IN NACL 1000 MG/100ML IV SOLN
1000.0000 mg | Freq: Two times a day (BID) | INTRAVENOUS | Status: DC
  Administered 2023-01-22 – 2023-01-24 (×4): 1000 mg via INTRAVENOUS
  Filled 2023-01-22 (×4): qty 100

## 2023-01-22 MED ORDER — LORAZEPAM 2 MG/ML IJ SOLN
INTRAMUSCULAR | Status: AC
  Administered 2023-01-22: 2 mg via INTRAVENOUS
  Filled 2023-01-22: qty 1

## 2023-01-22 MED ORDER — LEVETIRACETAM IN NACL 1000 MG/100ML IV SOLN
1000.0000 mg | Freq: Once | INTRAVENOUS | Status: AC
  Administered 2023-01-22: 1000 mg via INTRAVENOUS
  Filled 2023-01-22: qty 100

## 2023-01-22 NOTE — ED Notes (Signed)
Patient repositioned in bed. Sporadic eye opening.

## 2023-01-22 NOTE — Procedures (Signed)
Patient Name: Anne [name]  MRN: [id number]  Epilepsy Attending: Charlsie Quest  Referring Physician/Provider: Lynnae January, NP  Date: 01/22/2023 Duration: 25.21 mins  Patient history: 72yo F with left SDH, now with seizure. EEG to evaluate for seizure  Level of alertness: Awake, asleep  AEDs during EEG study: LEV  Technical aspects: This EEG study was done with scalp electrodes positioned according to the 10-20 International system of electrode placement. Electrical activity was reviewed with band pass filter of 1-70Hz , sensitivity of 7 uV/mm, display speed of 17mm/sec with a 60Hz  notched filter applied as appropriate. EEG data were recorded continuously and digitally stored.  Video monitoring was available and reviewed as appropriate.  Description: No clear posterior dominant rhythm was seen.  Sleep was characterized by sleep symptoms (12 to 14 Hz), maximal frontocentral region. EEG showed continuous generalized 3-5 Hz theta-delta slowing.    Between 1122 to 1124, lateralized periodic discharges were noted in left hemisphere, maximal left frontal region at 1Hz .  On video, patient was noted to be asleep, no clinical signs were noted.  Again around 1133, EEG showed rhythmic 2 to 3 Hz delta slowing admixed with lateralized periodic discharges in the left hemisphere, maximal left frontal region.  Per EEG tech, "leg twitching" was noted which was difficult to visualize on camera.  At 1136, EEG evolved into sharply contoured 5 to 6 Hz theta slowing admixed with spikes in left frontal region which then involve of left hemisphere and subsequently evolved into 2 to 3 Hz delta slowing.  This EEG pattern is consistent with focal seizure.  Again no definite clinical signs were noted due to limited visibility of video.  Duration of seizure was about 4 minutes  Hyperventilation and photic stimulation were not performed.     ABNORMALITY -Focal seizure, left frontal region -Lateralized periodic  discharges, left hemisphere, left maximal left frontal region -Continuous slow, generalized  IMPRESSION: This study showed one focal seizure arising from left frontal region as described above on 01/22/2023 at 1136, lasting for about 4 minutes.  Additionally EEG showed evidence of epileptogenicity in left hemisphere, maximal left frontal region with increased risk of seizure recurrence.  Lastly there was moderate to severe diffuse encephalopathy.  Recommend long-term EEG for further monitoring of seizures  Dr. Selina Cooley was notified.  [name]

## 2023-01-22 NOTE — Telephone Encounter (Signed)
Spoke to pt's daughter. She wanted to let us know pt had another fall. Pt is currently in the hospital being monitored. Daughter stated they that pt has another brain bleed. Informed daughter I would let pcp know as well

## 2023-01-22 NOTE — Telephone Encounter (Signed)
Upon review, Dr Katrinka Blazing has also seen this patient (on 12/31/22). However, she is currently re-admitted as of this morning.  I will forward this message to Dr Katrinka Blazing for review to determine follow up plan given re-admission.

## 2023-01-22 NOTE — Progress Notes (Signed)
EEG complete - results pending 

## 2023-01-22 NOTE — Consult Note (Cosign Needed Addendum)
   Providing Compassionate, Quality Care - Together  Neurosurgery Consult  Referring physician: EDP Reason for referral: SDH   History of Present Illness: Pt with PMH of sm cell lung ca, dementia presented to ED with recent seizure like activity. Known L SDH, seen by [facility] service. Not currently anticoagulated. History provided by husband who is HOH. He reports pt has had numerous recent falls. Workup revealing increased size of L SDH.    Physical Exam:  Vital signs in last 24 hours: Temp:  [98 F (36.7 C)-98.3 F (36.8 C)] 98 F (36.7 C) (07/25 1814) Pulse Rate:  [58-128] 65 (07/26 0746) Resp:  [11-18] 14 (07/26 0217) BP: (138-182)/(65-125) 153/88 (07/26 0700) SpO2:  [91 %-98 %] 96 % (07/26 0746)  PE: Drowsy, will wake easily PERRLA EOMI FC, MAE x4 Mixed ecchymosis around L eye  CTH reviewed showing interval increase in L SDH w/o significant MLS or mass effect.   Impression/Assessment:  This is a 72 yo female with  -seizures -interval increase of mixed L SDH   Plan:  -Appreciate Neurology/TRH mgmt/input -SBP <150 -Neuro checks.  -Repeat CTH tmrw AM -Continue to monitor seizure control, mental status. No acute intervention indicated at this time.   Armine Rizzolo Margaree Mackintosh, PA-C

## 2023-01-22 NOTE — ED Notes (Signed)
ED TO INPATIENT HANDOFF REPORT  ED Nurse Name and Phone #: Trinna Post, RN [phone]  S Name/Age/Gender Anne [name] 72 y.o. female Room/Bed: 012C/012C  Code Status   Code Status: Full Code  Home/SNF/Other Home Disoreintedx4, responsive to pain  Is this baseline? No   Triage Complete: Triage complete  Chief Complaint Seizure [facility]) [R56.9]  Triage Note Patient was seen 3 days ago for a fall, diagnosed with a "brain bleed" but ultimately discharged, patient has been having headaches and seizures since. Husband called after witnessed seizure this morning. Patient arrives postictal.    Allergies Allergies  Allergen Reactions   Bee Venom Swelling    Level of Care/Admitting Diagnosis ED Disposition     ED Disposition  Admit   Condition  --   Comment  Hospital Area: [facility] [[id number]]  Level of Care: Progressive [102]  Admit to Progressive based on following criteria: NEUROLOGICAL AND NEUROSURGICAL complex patients with significant risk of instability, who do not meet ICU criteria, yet require close observation or frequent assessment (< / = every 2 - 4 hours) with medical / nursing intervention.  May admit patient to [facility] or [facility] if equivalent level of care is available:: No  Covid Evaluation: Asymptomatic - no recent exposure (last 10 days) testing not required  Diagnosis: Seizure (HCC) [205090]  Admitting Physician: Ginnie Smart [[id number]]  Attending Physician: Ninetta Lights, JEFFREY C [[id number]]  Certification:: I certify this patient will need inpatient services for at least 2 midnights  Expected Medical Readiness: 01/25/2023          B Medical/Surgery History Past Medical History:  Diagnosis Date   Arthritis    Cirrhosis (HCC)    Dementia (HCC)    Depression    GERD (gastroesophageal reflux disease)    Hyperlipidemia    Hypertension    Lung cancer (HCC)    Metastatic bone cancer    Past Surgical History:  Procedure Laterality Date    APPENDECTOMY  1971   CHOLECYSTECTOMY  1971   COLONOSCOPY WITH PROPOFOL N/A 11/15/2016   Procedure: COLONOSCOPY WITH PROPOFOL;  Surgeon: Wyline Mood, MD;  Location: [facility] ENDOSCOPY;  Service: Gastroenterology;  Laterality: N/A;   ENDOBRONCHIAL ULTRASOUND Right 07/30/2018   Procedure: ENDOBRONCHIAL ULTRASOUND;  Surgeon: Shane Crutch, MD;  Location: [facility] ORS;  Service: Pulmonary;  Laterality: Right;   ESOPHAGOGASTRODUODENOSCOPY (EGD) WITH PROPOFOL N/A 01/07/2017   Procedure: ESOPHAGOGASTRODUODENOSCOPY (EGD) WITH PROPOFOL;  Surgeon: Wyline Mood, MD;  Location: [facility] ENDOSCOPY;  Service: Gastroenterology;  Laterality: N/A;   LAPAROSCOPY N/A 03/01/2017   Procedure: LAPAROSCOPY DIAGNOSTIC;  Surgeon: Earline Mayotte, MD;  Location: [facility] ORS;  Service: General;  Laterality: N/A;   PORTA CATH INSERTION N/A 08/14/2018   Procedure: PORTA CATH INSERTION;  Surgeon: Annice Needy, MD;  Location: [facility] INVASIVE CV LAB;  Service: Cardiovascular;  Laterality: N/A;   TONSILECTOMY, ADENOIDECTOMY, BILATERAL MYRINGOTOMY AND TUBES  1955   TONSILLECTOMY     VENTRAL HERNIA REPAIR N/A 03/01/2017   10 x 14 CM Ventralight ST mesh, intraperitoneal location.    VENTRAL HERNIA REPAIR N/A 03/01/2017   Procedure: HERNIA REPAIR VENTRAL ADULT;  Surgeon: Earline Mayotte, MD;  Location: [facility] ORS;  Service: General;  Laterality: N/A;     A IV Location/Drains/Wounds Patient Lines/Drains/Airways Status     Active Line/Drains/Airways     Name Placement date Placement time Site Days   Peripheral IV 01/22/23 18 G Left Antecubital 01/22/23  0705  Antecubital   less than 1  Wound / Incision (Open or Dehisced) 10/29/18 Other (Comment) Leg Left;Posterior;Upper Boil 10/29/18  0058  Leg  1546            Intake/Output Last 24 hours  Intake/Output Summary (Last 24 hours) at 01/22/2023 2012 Last data filed at 01/22/2023 7829 Gross per 24 hour  Intake 280 ml  Output --  Net 280 ml    Labs/Imaging Results for orders  placed or performed during the hospital encounter of 01/22/23 (from the past 48 hours)  CBG monitoring, ED     Status: None   Collection Time: 01/22/23  8:51 AM  Result Value Ref Range   Glucose-Capillary 81 70 - 99 mg/dL    Comment: Glucose reference range applies only to samples taken after fasting for at least 8 hours.  Valproic acid level     Status: Abnormal   Collection Time: 01/22/23  9:17 AM  Result Value Ref Range   Valproic Acid Lvl 25 (L) 50.0 - 100.0 ug/mL    Comment: Performed at [facility], [address], [place], [name] [address]  Comprehensive metabolic panel     Status: Abnormal   Collection Time: 01/22/23  9:17 AM  Result Value Ref Range   Sodium 139 135 - 145 mmol/L   Potassium 3.3 (L) 3.5 - 5.1 mmol/L   Chloride 103 98 - 111 mmol/L   CO2 28 22 - 32 mmol/L   Glucose, Bld 90 70 - 99 mg/dL    Comment: Glucose reference range applies only to samples taken after fasting for at least 8 hours.   BUN 21 8 - 23 mg/dL   Creatinine, Ser 0.86 0.44 - 1.00 mg/dL   Calcium 8.8 (L) 8.9 - 10.3 mg/dL   Total Protein 5.5 (L) 6.5 - 8.1 g/dL   Albumin 2.8 (L) 3.5 - 5.0 g/dL   AST 83 (H) 15 - 41 U/L   ALT 75 (H) 0 - 44 U/L   Alkaline Phosphatase 142 (H) 38 - 126 U/L   Total Bilirubin 0.7 <1.2 mg/dL   GFR, Estimated 60 (L) >60 mL/min    Comment: (NOTE) Calculated using the CKD-EPI Creatinine Equation (2021)    Anion gap 8 5 - 15    Comment: Performed at [facility], [address], [place], [name] [address]  CBC with Differential/Platelet     Status: Abnormal   Collection Time: 01/22/23  9:17 AM  Result Value Ref Range   WBC 4.2 4.0 - 10.5 K/uL   RBC 4.17 3.87 - 5.11 MIL/uL   Hemoglobin 12.6 12.0 - 15.0 g/dL   HCT 96.2 95.2 - 84.1 %   MCV 91.6 80.0 - 100.0 fL   MCH 30.2 26.0 - 34.0 pg   MCHC 33.0 30.0 - 36.0 g/dL   RDW 32.4 40.1 - 02.7 %   Platelets 123 (L) 150 - 400 K/uL   nRBC 0.0 0.0 - 0.2 %   Neutrophils Relative % 71 %   Neutro Abs 3.0 1.7 - 7.7  K/uL   Lymphocytes Relative 19 %   Lymphs Abs 0.8 0.7 - 4.0 K/uL   Monocytes Relative 8 %   Monocytes Absolute 0.4 0.1 - 1.0 K/uL   Eosinophils Relative 1 %   Eosinophils Absolute 0.0 0.0 - 0.5 K/uL   Basophils Relative 1 %   Basophils Absolute 0.0 0.0 - 0.1 K/uL   Immature Granulocytes 0 %   Abs Immature Granulocytes 0.01 0.00 - 0.07 K/uL    Comment: Performed at  [facility], [address] [place], [place], [name] [address]  Protime-INR     Status: None   Collection Time: 01/22/23  9:17 AM  Result Value Ref Range   Prothrombin Time 15.2 11.4 - 15.2 seconds   INR 1.2 0.8 - 1.2    Comment: (NOTE) INR goal varies based on device and disease states. Performed at [facility], [address] [place], [name] [address]    *Note: Due to a large number of results and/or encounters for the requested time period, some results have not been displayed. A complete set of results can be found in Results Review.   EEG adult Result Date: 01/22/2023 Charlsie Quest, MD     01/22/2023 12:26 PM Patient Name: Anne [name] MRN: [id number] Epilepsy Attending: Charlsie Quest Referring Physician/Provider: Lynnae January, NP Date: 01/22/2023 Duration: 25.21 mins Patient history: 72yo F with left SDH, now with seizure. EEG to evaluate for seizure Level of alertness: Awake, asleep AEDs during EEG study: LEV Technical aspects: This EEG study was done with scalp electrodes positioned according to the 10-20 International system of electrode placement. Electrical activity was reviewed with band pass filter of 1-70Hz , sensitivity of 7 uV/mm, display speed of 88mm/sec with a 60Hz  notched filter applied as appropriate. EEG data were recorded continuously and digitally stored.  Video monitoring was available and reviewed as appropriate. Description: No clear posterior dominant rhythm was seen.  Sleep was characterized by sleep symptoms (12 to 14 Hz), maximal frontocentral region. EEG showed continuous  generalized 3-5 Hz theta-delta slowing.  Between 1122 to 1124, lateralized periodic discharges were noted in left hemisphere, maximal left frontal region at 1Hz .  On video, patient was noted to be asleep, no clinical signs were noted.  Again around 1133, EEG showed rhythmic 2 to 3 Hz delta slowing admixed with lateralized periodic discharges in the left hemisphere, maximal left frontal region.  Per EEG tech, "leg twitching" was noted which was difficult to visualize on camera.  At 1136, EEG evolved into sharply contoured 5 to 6 Hz theta slowing admixed with spikes in left frontal region which then involve of left hemisphere and subsequently evolved into 2 to 3 Hz delta slowing.  This EEG pattern is consistent with focal seizure.  Again no definite clinical signs were noted due to limited visibility of video.  Duration of seizure was about 4 minutes Hyperventilation and photic stimulation were not performed.   ABNORMALITY -Focal seizure, left frontal region -Lateralized periodic discharges, left hemisphere, left maximal left frontal region -Continuous slow, generalized IMPRESSION: This study showed one focal seizure arising from left frontal region as described above on 01/22/2023 at 1136, lasting for about 4 minutes.  Additionally EEG showed evidence of epileptogenicity in left hemisphere, maximal effort related with increased risk of seizure recurrence.  Lastly there was moderate to severe diffuse encephalopathy. Recommend long-term EEG for further monitoring of seizures Dr. Selina Cooley was notified. Charlsie Quest   CT Head Wo Contrast Result Date: 01/22/2023 CLINICAL DATA:  Seizure disorder with clinical change. EXAM: CT HEAD WITHOUT CONTRAST TECHNIQUE: Contiguous axial images were obtained from the base of the skull through the vertex without intravenous contrast. RADIATION DOSE REDUCTION: This exam was performed according to the departmental dose-optimization program which includes automated exposure control,  adjustment of the mA and/or kV according to patient size and/or use of iterative reconstruction technique. COMPARISON:  Three days ago FINDINGS: Brain: Subdural hematoma on the left, primarily high-density and increased from before.  Maximal thickness is 1 cm on coronal reformats with mild cortical mass effect. The hematoma is seen both laterally and inferior to the left frontal lobe. There is a low-density hygroma appearance in the subdural space at the superior right frontal convexity since prior. A trace high-density subdural hematoma inferior to the right frontal lobe is unchanged at 2 mm thickness. No evidence of underlying infarct. No hydrocephalus or mass. Vascular: Atheromatous calcification. Skull: Unremarkable Sinuses/Orbits: Bilateral cataract resection IMPRESSION: 1. Increase in acute subdural hematoma on the left, measuring up to 1 cm. No shift of the atrophic brain. 2. Subdural hemorrhage inferior to the right frontal lobe is stable and trace when compared to prior. There is a degree of increased/new hygroma at the right vertex since prior. Electronically Signed   By: Tiburcio Pea M.D.   On: 01/22/2023 08:13    Pending Labs Unresulted Labs (From admission, onward)     Start     Ordered   01/23/23 0500  Basic metabolic panel  Tomorrow morning,   R        01/22/23 1400   01/23/23 0500  CBC  Tomorrow morning,   R        01/22/23 1400            Vitals/Pain Today's Vitals   01/22/23 1822 01/22/23 1900 01/22/23 1930 01/22/23 2000  BP: (!) 163/85 (!) 162/86 (!) 173/88 (!) 170/88  Pulse: 79 76 75 78  Resp: 13 (!) 22 17 (!) 21  Temp: 98.4 F (36.9 C)     TempSrc: Axillary     SpO2: 100% 100% 100% 100%    Isolation Precautions No active isolations  Medications Medications  levETIRAcetam (KEPPRA) IVPB 1000 mg/100 mL premix (0 mg Intravenous Stopped 01/22/23 1957)  labetalol (NORMODYNE) injection 5 mg (5 mg Intravenous Given 01/22/23 2005)  LORazepam (ATIVAN) injection 2 mg  (2 mg Intravenous Given 01/22/23 0718)  levETIRAcetam (KEPPRA) IVPB 1000 mg/100 mL premix (0 mg Intravenous Stopped 01/22/23 0853)    Followed by  levETIRAcetam (KEPPRA) IVPB 1000 mg/100 mL premix (0 mg Intravenous Stopped 01/22/23 0854)    Mobility non-ambulatory     Focused Assessments Neuro-Patient will respond when asked to grip fingers. However sporadic movements of feet and hands periodically. Non-verbal currently and not opening eyes. Airway and breathing stable.    R Recommendations: See Admitting Provider Note  Report given to:   Additional Notes:

## 2023-01-22 NOTE — Telephone Encounter (Signed)
The ER called Dr Madaline Brilliant on 01/19/23 when he was covering call for our department.   Per Dr Madaline Brilliant:   "37F no anticoag mechanical fall, head strike, neg LOC. Small 3mm L acute SDH, scattered subfrontal SAH.  Stable repeat scan. PLAN: Will need outpatient followup with repeat CT Keppra x 7 days"

## 2023-01-22 NOTE — Consult Note (Signed)
NEUROLOGY CONSULT NOTE   Date of service: January 22, 2023 Patient Name: Anne [name] MRN:  [id number] DOB:  [birth year] Chief Complaint: "seizures" Requesting Provider: Derwood Kaplan, MD  History of Present Illness  Anne [name] is a 72 y.o. female with PMH significant for seizures, dementia, HTN, HLD, Metastatic bone cancer, lung cancer, depression who was BIB EMS due to seizures early this morning. Husband states she has has multiple seizures over the past few days, along with headaches and being unsteady on her feet, increased lethargy and reduced PO intake.  On exam, patient appears post-ictal. She opens her R eye briefly to sternal rub. (L eye is too swollen to open). R eye shows L gaze and nystagmus. Against resistance strength in all extremities. Does not follow commands, but pushes examiner away with BUE.  Patient presented s/p fall to [facility] Saturday AM. She was diagnosed with an acute SDH, she was discharged home and told to take Keppra 500mg  for 1 week. Neurology was not consulted.  She also presented to [facility] ED in October 24 and was diagnosed with bilateral SDH s/p fall. Neurology was consulted. She was given a depakote load. Repeat CT was stable and she was discharged. She sees Dr. Teresa Coombs outpatient for history of seizures. Her Depakote was increased in October from 250mg  BID to 375mg  TID.  PT/INR WNL. Platelets have decreased from 163 to 123 since last month. Valproic acid level low at 25, but increased from 18 last month.    Neurosurgery consulted d/t increased L SDH. Recommend BP control <150 and repeat CTH tomorrow AM.   ROS   Unable to ascertain due to AMS  Past History   Past Medical History:  Diagnosis Date   Arthritis    Cirrhosis (HCC)    Dementia (HCC)    Depression    GERD (gastroesophageal reflux disease)    Hyperlipidemia    Hypertension    Lung cancer (HCC)    Metastatic bone cancer     Past Surgical History:  Procedure Laterality Date    APPENDECTOMY  1971   CHOLECYSTECTOMY  1971   COLONOSCOPY WITH PROPOFOL N/A 11/15/2016   Procedure: COLONOSCOPY WITH PROPOFOL;  Surgeon: Wyline Mood, MD;  Location: [facility] ENDOSCOPY;  Service: Gastroenterology;  Laterality: N/A;   ENDOBRONCHIAL ULTRASOUND Right 07/30/2018   Procedure: ENDOBRONCHIAL ULTRASOUND;  Surgeon: Shane Crutch, MD;  Location: [facility] ORS;  Service: Pulmonary;  Laterality: Right;   ESOPHAGOGASTRODUODENOSCOPY (EGD) WITH PROPOFOL N/A 01/07/2017   Procedure: ESOPHAGOGASTRODUODENOSCOPY (EGD) WITH PROPOFOL;  Surgeon: Wyline Mood, MD;  Location: [facility] ENDOSCOPY;  Service: Gastroenterology;  Laterality: N/A;   LAPAROSCOPY N/A 03/01/2017   Procedure: LAPAROSCOPY DIAGNOSTIC;  Surgeon: Earline Mayotte, MD;  Location: [facility] ORS;  Service: General;  Laterality: N/A;   PORTA CATH INSERTION N/A 08/14/2018   Procedure: PORTA CATH INSERTION;  Surgeon: Annice Needy, MD;  Location: [facility] INVASIVE CV LAB;  Service: Cardiovascular;  Laterality: N/A;   TONSILECTOMY, ADENOIDECTOMY, BILATERAL MYRINGOTOMY AND TUBES  1955   TONSILLECTOMY     VENTRAL HERNIA REPAIR N/A 03/01/2017   10 x 14 CM Ventralight ST mesh, intraperitoneal location.    VENTRAL HERNIA REPAIR N/A 03/01/2017   Procedure: HERNIA REPAIR VENTRAL ADULT;  Surgeon: Earline Mayotte, MD;  Location: [facility] ORS;  Service: General;  Laterality: N/A;    Family History: Family History  Problem Relation Age of Onset   Hypertension Mother    Ovarian cancer Mother 20   Dementia Mother    Heart disease Father  Stroke Father    Seizures Father    Ovarian cancer Sister        ? dx cancer had hyst.   Breast cancer Neg Hx     Social History  reports that she quit smoking about 10 years ago. Her smoking use included cigarettes and e-cigarettes. She started smoking about 61 years ago. She has a 100 pack-year smoking history. She has never used smokeless tobacco. She reports that she does not drink alcohol and does not use  drugs.  Allergies  Allergen Reactions   Bee Venom Swelling    Medications   Current Facility-Administered Medications:    levETIRAcetam (KEPPRA) 750 mg in sodium chloride 0.9 % 100 mL IVPB, 750 mg, Intravenous, Q12H, Hetty Blend C, NP  Current Outpatient Medications:    acetaminophen (TYLENOL) 500 MG tablet, Take 1,000 mg by mouth every 6 (six) hours as needed for moderate pain (pain score 4-6)., Disp: , Rfl:    amLODipine (NORVASC) 5 MG tablet, Take 1 tablet by mouth once daily, Disp: 90 tablet, Rfl: 0   buPROPion (WELLBUTRIN XL) 300 MG 24 hr tablet, Take 1 tablet by mouth once daily, Disp: 90 tablet, Rfl: 0   divalproex (DEPAKOTE) 125 MG DR tablet, Take 3 tablets (375 mg total) by mouth 3 (three) times daily., Disp: 270 tablet, Rfl: 2   levETIRAcetam (KEPPRA) 500 MG tablet, Take 1 tablet (500 mg total) by mouth 2 (two) times daily for 7 days., Disp: 14 tablet, Rfl: 0   levothyroxine (SYNTHROID) 112 MCG tablet, Take 112 mcg by mouth daily before breakfast., Disp: , Rfl:    memantine (NAMENDA) 10 MG tablet, Take 1 tablet (10 mg total) by mouth 2 (two) times daily., Disp: 60 tablet, Rfl: 11   Multiple Vitamin (MULTIVITAMIN) tablet, Take 1 tablet by mouth daily., Disp: , Rfl:    QUEtiapine (SEROQUEL) 25 MG tablet, Take 0.5 tablets (12.5 mg total) by mouth 2 (two) times daily., Disp: 30 tablet, Rfl: 11   rivastigmine (EXELON) 3 MG capsule, Take 1 capsule (3 mg total) by mouth 2 (two) times daily., Disp: 60 capsule, Rfl: 11   rosuvastatin (CRESTOR) 20 MG tablet, Take 1 tablet (20 mg total) by mouth daily., Disp: 90 tablet, Rfl: 3   sertraline (ZOLOFT) 100 MG tablet, TAKE 1 & 1/2 (ONE & ONE-HALF) TABLETS BY MOUTH ONCE DAILY, Disp: 135 tablet, Rfl: 0   divalproex (DEPAKOTE) 250 MG DR tablet, Take 250 mg by mouth 2 (two) times daily. (Patient not taking: Reported on 01/22/2023), Disp: , Rfl:    levothyroxine (SYNTHROID) 88 MCG tablet, Take 1 tablet (88 mcg total) by mouth daily. (Patient not  taking: Reported on 01/22/2023), Disp: 90 tablet, Rfl: 0  Facility-Administered Medications Ordered in Other Encounters:    denosumab (XGEVA) injection 120 mg, 120 mg, Subcutaneous, Q30 days, Creig Hines, MD, 120 mg at 03/05/19 1525   heparin lock flush 100 UNIT/ML injection, , , ,   Vitals   Vitals:   01/22/23 0915 01/22/23 0930 01/22/23 0945 01/22/23 1000  BP: (!) 151/81 (!) 148/83 (!) 150/81 (!) 145/83  Pulse: 77 73 73 75  Resp: 18 16 14 14   Temp:      TempSrc:      SpO2: 96% 96% 97% 97%    There is no height or weight on file to calculate BMI.  Physical Exam   Constitutional: Appears frail, elderly HHENT: [place], Left eye swelling and bruising.  Cardiovascular: Normal rate and regular rhythm.  Respiratory: Effort normal,  non-labored breathing. 2L [place].  Skin: multiple scattered bruises.  Neurologic Examination   Patient appears post-ictal.  She opens her R eye briefly to sternal rub. Left eye is severely swollen and she is unable to open. R eye shows left gaze with nystagmus.  She does not follow commands. She pushes examiner away with bilateral resistance upper extremity strength. She withdraws BLE with against resistance strength and is able to hold BLE in knee bent position.   Labs/Imaging/Neurodiagnostic studies   CBC:  Recent Labs  Lab Jan 25, 2023 0917  WBC 4.2  NEUTROABS 3.0  HGB 12.6  HCT 38.2  MCV 91.6  PLT 123*   Basic Metabolic Panel:  Lab Results  Component Value Date   NA 139 2023/01/25   K 3.3 (L) 2023-01-25   CO2 28 01-25-2023   GLUCOSE 90 2023/01/25   BUN 21 01-25-2023   CREATININE 0.99 01/25/23   CALCIUM 8.8 (L) 01-25-23   GFRNONAA 60 (L) Jan 25, 2023   GFRAA >60 11/05/2019   Lipid Panel:  Lab Results  Component Value Date   LDLCALC 82 08/24/2020   HgbA1c:  Lab Results  Component Value Date   HGBA1C 6.1 09/12/2016   Urine Drug Screen:     Component Value Date/Time   LABOPIA NONE DETECTED 03/22/2022 1845   COCAINSCRNUR NONE  DETECTED 03/22/2022 1845   LABBENZ NONE DETECTED 03/22/2022 1845   AMPHETMU NONE DETECTED 03/22/2022 1845   THCU NONE DETECTED 03/22/2022 1845   LABBARB NONE DETECTED 03/22/2022 1845    Alcohol Level     Component Value Date/Time   ETH <10 04/05/2022 2036   INR  Lab Results  Component Value Date   INR 1.2 25-Jan-2023   APTT  Lab Results  Component Value Date   APTT 29 10/29/2018   AED levels: No results found for: "PHENYTOIN", "ZONISAMIDE", "LAMOTRIGINE", "LEVETIRACETA"  CT Head without contrast(Personally reviewed): Increase in acute L SDH, measuring up to 1cm.  R frontal SDH is stable Degree of increased/new hygroma at right vertex since prior.   Neurodiagnostics rEEG:  Study showed one focal seizure from left frontal region, lasting for about 4 minutes. EEG also showed evidence of epileptogenicity in left hemisphere, maximal effort related with increased risk of seizure recurrence. Lastly, there was moderate to severe diffuse encephalopathy.   ASSESSMENT   SHANQUITA MCNEESE is a 72 y.o. female with PMH significant for dementia, HTN, HLD, Metastatic bone cancer, lung cancer, depression who was BIB EMS due to seizures early this morning. S/p fall Saturday AM, diagnosed with SDH at [facility], discharged on Keppra. Today's CT shows increased left SDH and stable R SDH. Spot EEG showed seizures lasting about 4 minutes, will hook up to LTM for further evaluation.  Seizures are likely due to increased SDH, but other etiologies should be explored as well, such as infectious/ toxic-metabolic processes.  Depakote is listed on patient's home medications as well as Wellbutrin. These both have interactions with this patient's condition and should not be restarted.   RECOMMENDATIONS  - STAT hook up to LTM EEG - Agree with NS repeat CTH tomorrow AM  - STAT CTH with any acute neuro change - Increase Keppra to 1000mg  BID, starting tonight (ordered) - Do not restart home Wellbutrin, as it lowers  the seizure threshold - Do not restart home Depakote, as it can lower platelet levels and increase risk of bleeding.  - Infectious and Toxic-Metabolic workup per primary to evaluate for other possible etiologies of seizures.  _______________________________________________________________   Pt seen  by Neuro NP/APP and later by MD. Note/plan to be edited by MD as needed.    Lynnae January, DNP, AGACNP-BC [facility] Please use AMION for contact information & EPIC for messaging.   Attending Neurohospitalist Addendum Patient seen and examined with APP/Resident. Agree with the history and physical as documented above. Agree with the plan as documented, which I helped formulate. I have edited the note above to reflect my full findings and recommendations. I have independently reviewed the chart, obtained history, review of systems and examined the patient.I have personally reviewed pertinent head/neck/spine imaging (CT/MRI). Please feel free to call with any questions.  -- Bing Neighbors, MD [facility] [phone]  If 7pm- 7am, please page neurology on call as listed in AMION.

## 2023-01-22 NOTE — ED Provider Notes (Signed)
[name] Provider Note   CSN: [id number] Arrival date & time: 01/22/23  9323     History  Chief Complaint  Patient presents with   Seizures    Anne [name] is a 72 y.o. female.  The history is provided by the spouse. The history is limited by the condition of the patient.  Seizures Patient presents to the ED with multiple episodes of seizures 3 days post fall where she was treated for a head bleed at [name].  Previous medical history of lung cancer, hypertension, hypothyroidism, dementia, seizures .  Not anticoagulated. 01/19/2023, CT showed multifocal/multicompartment acute hemorrhage and repeat showed similar vs slightly decreased. Pt and spouse given return to ER conditions, and discharged on keppra. Had been previously on depakote but husband reports patient is not taking.  Today, husband states that patient has been having headaches since discharge.  Husband states that she is chronically altered at baseline with dementia. He reports that she slept all day Sunday and that on Monday she was walking around and behaving fairly normally, but did have 1 episode of attempting to get dressed at 10 PM which was abnormal for her. Denies vomiting.     Home Medications Prior to Admission medications   Medication Sig Start Date End Date Taking? Authorizing Provider  acetaminophen (TYLENOL) 500 MG tablet Take 1,000 mg by mouth every 6 (six) hours as needed for moderate pain (pain score 4-6).   Yes [provider]  amLODipine (NORVASC) 5 MG tablet Take 1 tablet by mouth once daily 11/15/22  Yes Glori Luis, MD  buPROPion (WELLBUTRIN XL) 300 MG 24 hr tablet Take 1 tablet by mouth once daily 11/15/22  Yes Glori Luis, MD  divalproex (DEPAKOTE) 125 MG DR tablet Take 3 tablets (375 mg total) by mouth 3 (three) times daily. 11/25/22 02/23/23 Yes Ray, Danie Binder, MD  levETIRAcetam (KEPPRA) 500 MG tablet Take 1 tablet (500 mg total) by  mouth 2 (two) times daily for 7 days. 01/19/23 01/26/23 Yes Delton Prairie, MD  levothyroxine (SYNTHROID) 112 MCG tablet Take 112 mcg by mouth daily before breakfast.   Yes [provider]  memantine (NAMENDA) 10 MG tablet Take 1 tablet (10 mg total) by mouth 2 (two) times daily. 11/14/22 11/09/23 Yes Windell Norfolk, MD  Multiple Vitamin (MULTIVITAMIN) tablet Take 1 tablet by mouth daily.   Yes [provider]  QUEtiapine (SEROQUEL) 25 MG tablet Take 0.5 tablets (12.5 mg total) by mouth 2 (two) times daily. 11/14/22 12/09/23 Yes Windell Norfolk, MD  rivastigmine (EXELON) 3 MG capsule Take 1 capsule (3 mg total) by mouth 2 (two) times daily. 11/14/22 11/09/23 Yes Camara, Amalia Hailey, MD  rosuvastatin (CRESTOR) 20 MG tablet Take 1 tablet (20 mg total) by mouth daily. 05/23/22  Yes Glori Luis, MD  sertraline (ZOLOFT) 100 MG tablet TAKE 1 & 1/2 (ONE & ONE-HALF) TABLETS BY MOUTH ONCE DAILY 11/01/22  Yes Glori Luis, MD  levothyroxine (SYNTHROID) 88 MCG tablet Take 1 tablet (88 mcg total) by mouth daily. Patient not taking: Reported on 01/22/2023 01/15/23   Glori Luis, MD      Allergies    Bee venom    Review of Systems   Review of Systems  Neurological:  Positive for seizures and headaches.  All other systems reviewed and are negative.   Physical Exam Updated Vital Signs BP (!) 163/79 (BP Location: Right Arm)   Pulse 71   Temp 98.6 F (37 C) (  Axillary)   Resp 16   SpO2 100%  Physical Exam Vitals and nursing note reviewed.  Constitutional:      Appearance: She is ill-appearing.  HENT:     Head: Normocephalic.     Comments: 8 x 8cm contusion noted around left orbit.   2 x 2cm hematoma noted over L frontal aspect of forehead.        Right Ear: External ear normal.     Left Ear: External ear normal.  Eyes:     General: No scleral icterus.       Right eye: No foreign body.        Left eye: No foreign body.     Extraocular Movements:     Right eye:  Abnormal extraocular motion and nystagmus present.     Left eye: Abnormal extraocular motion and nystagmus present.     Conjunctiva/sclera:     Left eye: Hemorrhage present.  Cardiovascular:     Rate and Rhythm: Normal rate.     Pulses: Normal pulses.     Heart sounds: Murmur heard.  Pulmonary:     Effort: Pulmonary effort is normal.     Breath sounds: No wheezing, rhonchi or rales.  Abdominal:     General: Abdomen is flat. There is no distension.     Tenderness: There is no abdominal tenderness. There is no guarding.  Musculoskeletal:        General: No swelling.  Skin:    Coloration: Skin is not jaundiced or pale.     Findings: No erythema.  Neurological:     Mental Status: She is disoriented.     Motor: Weakness present.     Comments: Upon initially talking to patient, she was alert but nonverbal.  Shortly after, patient began to have a tonic clonic seizure lasting approximately 90 seconds with eyes deviated to right side.     ED Results / Procedures / Treatments   Labs (all labs ordered are listed, but only abnormal results are displayed) Labs Reviewed  VALPROIC ACID Anne - Abnormal; Notable for the following components:      Result Value   Valproic Acid Lvl 25 (*)    All other components within normal limits  COMPREHENSIVE METABOLIC PANEL - Abnormal; Notable for the following components:   Potassium 3.3 (*)    Calcium 8.8 (*)    Total Protein 5.5 (*)    Albumin 2.8 (*)    AST 83 (*)    ALT 75 (*)    Alkaline Phosphatase 142 (*)    GFR, Estimated 60 (*)    All other components within normal limits  CBC WITH DIFFERENTIAL/PLATELET - Abnormal; Notable for the following components:   Platelets 123 (*)    All other components within normal limits  PROTIME-INR  CBG MONITORING, ED    EKG None  Radiology EEG adult Result Date: 01/22/2023 Anne Quest, MD     01/22/2023 12:26 PM Patient Name: Anne [name] MRN: [id number] Epilepsy Attending: Charlsie [name] Referring Physician/Provider: Lynnae January, NP Date: 01/22/2023 Duration: 25.21 mins Patient history: 72yo F with left SDH, now with seizure. EEG to evaluate for seizure Anne [name]: Awake, asleep AEDs during EEG study: LEV Technical aspects: This EEG study was done with scalp electrodes positioned according to the 10-20 International system of electrode placement. Electrical activity was reviewed with band pass filter of 1-70Hz , sensitivity of 7 uV/mm, display speed of 79mm/sec with a 60Hz  notched filter applied as appropriate.  EEG data were recorded continuously and digitally stored.  Video monitoring was available and reviewed as appropriate. Description: No clear posterior dominant rhythm was seen.  Sleep was characterized by sleep symptoms (12 to 14 Hz), maximal frontocentral region. EEG showed continuous generalized 3-5 Hz theta-delta slowing.  Between 1122 to 1124, lateralized periodic discharges were noted in left hemisphere, maximal left frontal region at 1Hz .  On video, patient was noted to be asleep, no clinical signs were noted.  Again around 1133, EEG showed rhythmic 2 to 3 Hz delta slowing admixed with lateralized periodic discharges in the left hemisphere, maximal left frontal region.  Per EEG tech, "leg twitching" was noted which was difficult to visualize on camera.  At 1136, EEG evolved into sharply contoured 5 to 6 Hz theta slowing admixed with spikes in left frontal region which then involve of left hemisphere and subsequently evolved into 2 to 3 Hz delta slowing.  This EEG pattern is consistent with focal seizure.  Again no definite clinical signs were noted due to limited visibility of video.  Duration of seizure was about 4 minutes Hyperventilation and photic stimulation were not performed.   ABNORMALITY -Focal seizure, left frontal region -Lateralized periodic discharges, left hemisphere, left maximal left frontal region -Continuous slow, generalized IMPRESSION: This study  showed one focal seizure arising from left frontal region as described above on 01/22/2023 at 1136, lasting for about 4 minutes.  Additionally EEG showed evidence of epileptogenicity in left hemisphere, maximal effort related with increased risk of seizure recurrence.  Lastly there was moderate to severe diffuse encephalopathy. Recommend long-term EEG for further monitoring of seizures Dr. Selina Cooley was notified. Anne [name]   CT Head Wo Contrast Result Date: 01/22/2023 CLINICAL DATA:  Seizure disorder with clinical change. EXAM: CT HEAD WITHOUT CONTRAST TECHNIQUE: Contiguous axial images were obtained from the base of the skull through the vertex without intravenous contrast. RADIATION DOSE REDUCTION: This exam was performed according to the departmental dose-optimization program which includes automated exposure control, adjustment of the mA and/or kV according to patient size and/or use of iterative reconstruction technique. COMPARISON:  Three days ago FINDINGS: Brain: Subdural hematoma on the left, primarily high-density and increased from before. Maximal thickness is 1 cm on coronal reformats with mild cortical mass effect. The hematoma is seen both laterally and inferior to the left frontal lobe. There is a low-density hygroma appearance in the subdural space at the superior right frontal convexity since prior. A trace high-density subdural hematoma inferior to the right frontal lobe is unchanged at 2 mm thickness. No evidence of underlying infarct. No hydrocephalus or mass. Vascular: Atheromatous calcification. Skull: Unremarkable Sinuses/Orbits: Bilateral cataract resection IMPRESSION: 1. Increase in acute subdural hematoma on the left, measuring up to 1 cm. No shift of the atrophic brain. 2. Subdural hemorrhage inferior to the right frontal lobe is stable and trace when compared to prior. There is a degree of increased/new hygroma at the right vertex since prior. Electronically Signed   By: Tiburcio Pea M.D.   On: 01/22/2023 08:13    Procedures .Critical Care  Performed by: Lunette Stands, PA-C Authorized by: Lunette Stands, PA-C   Critical care provider statement:    Critical care time (minutes):  49   Critical care time was exclusive of:  Separately billable procedures and treating other patients   Critical care was necessary to treat or prevent imminent or life-threatening deterioration of the following conditions: Status epilepticus.   Critical care was time spent personally  by me on the following activities:  Development of treatment plan with patient or surrogate, discussions with consultants, evaluation of patient's response to treatment, examination of patient, ordering and review of laboratory studies, ordering and review of radiographic studies, ordering and performing treatments and interventions, pulse oximetry, re-evaluation of patient's condition, review of old charts and obtaining history from patient or surrogate   I assumed direction of critical care for this patient from another provider in my specialty: yes     Care discussed with: admitting provider   Comments:     Provided 2 mg of Ativan and 2 g of Keppra transfusion     Medications Ordered in ED Medications  levETIRAcetam (KEPPRA) IVPB 1000 mg/100 mL premix (has no administration in time range)  labetalol (NORMODYNE) injection 5 mg (has no administration in time range)  LORazepam (ATIVAN) injection 2 mg (2 mg Intravenous Given 01/22/23 0718)  levETIRAcetam (KEPPRA) IVPB 1000 mg/100 mL premix (0 mg Intravenous Stopped 01/22/23 0853)    Followed by  levETIRAcetam (KEPPRA) IVPB 1000 mg/100 mL premix (0 mg Intravenous Stopped 01/22/23 0854)    ED Course/ Medical Decision Making/ A&P Clinical Course as of 01/22/23 1411  Tue Jan 22, 2023  1000 I consulted neurosurgery and spoke with the APP who discussed the case with her attending physician.  Discussed with the APP that patient had come in with seizures in  the setting of recent head bleed.  Seizures are new.  Patient also has CT scan that reveals expansion of the brain bleed area.  Per neurosurgery, patient needs a repeat CT scan in 8 hours.  No acute neurosurgical intervention for now. [AN]  1000 I consulted neurology for 2 episodes of seizure today in the setting of expansion of the head bleed, but no imminent neurosurgical intervention.  Dr. Selina Cooley will see the patient.  I discussed with her that patient is having some left-sided myoclonic jerking intermittently at this time.  She is aware that patient has received 2 g of Keppra. [AN]    Clinical Course User Index [AN] Derwood Kaplan, MD                                 Medical Decision Making Amount and/or Complexity of Data Reviewed Labs: ordered. Radiology: ordered.  Risk Prescription drug management.   This patient is a 72 year old female who presents to the ED for concern of seizures post traumatic incident.   Differential diagnoses prior to evaluation: The emergent differential diagnosis includes, but is not limited to, subdural hemorrhage/worsening bleed, seizure, tumor, metabolic abnormality, hypoglycemia, hypoxia, encephalitis. This is not an exhaustive differential.   Past Medical History / Co-morbidities / Social History: Hypertension, GERD, depression, metastatic bone cancer associated lung cancer, DM2, seizures  Additional history: Chart reviewed. Pertinent results include: Previously on Depakote starting on 10/14 after seizure however has not been taking it according to husband.  Lab Tests/Imaging studies: I personally interpreted labs/imaging and the pertinent results include:   CMP showed a potassium of 3.3, calcium 8.8, protein 5.5, albumin 2.8, elevated liver enzymes AST 83, ALT 75, alk phos 142.  These all are mildly elevated from baseline CBC shows decreased platelets with no other sign of anemia or systemic infection. Valproic acid is decreased at 25  I agree  with the radiologist interpretation.  Cardiac monitoring: Patient was monitored on cardiac monitor.   Medications: I ordered medication including 2mg  of Ativan provided and subsequent 2mg   of ativan 15 mins later and 2g of Keppra for status epilepticus.  I have reviewed the patients home medicines and have made adjustments as needed.  Critical Interventions: Status epilepticus - 2mg  of Ativan provided + subsequent 2mg  of ativan 15 mins later + 2g of Keppra   Consults:  Neurosurgery - consulted and stated that surgery was not necessary for this patient.   Neurology - consulted and stated that they would see the patient and advise.  ED Course:  Patient is a 72 year old female presents to the ED this morning with seizures.  History was provided by husband due to patient's AMS. Previous medical history of dementia, seizures, lung cancer, hypothyroidism, dementia, hypertension.  Previously seen on 01/19/2023 for fall where multifocal/multicompartment acute hemorrhage was noted and repeat showed similar vs slightly decreased.  Since then, husband states that she has complained of headaches but had returned to baseline somewhat.  Today, he saw her have a myoclonic seizure.  Upon evaluation of the patient, patient had repeat seizure in the ER.  2 mg of Ativan were given followed by another 2 mg of Ativan alongside 2 g of Keppra.  Patient placed on cardiac monitor.  Patient stabilized since with only mild myoclonic jerking.  Upon evaluation of patient, she remains nonverbal and has horizontal nystagmus to left side.  Vital signs are stable at this time.  Due to seizures and previous head bleed, neurosurgery was consulted alongside neurology.  Neurosurgery said that surgery was not an option at the time.  Neurology said that they will follow this patient and advise.  Patient then admitted to hospitalist.   Disposition: After consideration of the diagnostic results and the patients response to treatment, I  feel that the patient would benefit from admission, being followed by neurology, monitored for further seizure activity.    Patient was admitted to hospitalist who assumed care of patient.   Final Clinical Impression(s) / ED Diagnoses Final diagnoses:  Seizure (HCC)  Subdural hematoma [facility])    Rx / DC Orders ED Discharge Orders     None         Lunette Stands, PA-C 01/22/23 1411    Derwood Kaplan, MD 01/24/23 1000

## 2023-01-22 NOTE — Hospital Course (Addendum)
Follow-up:  Mental status - Patient mostly alert to self, able to answer simple questions including yes/no questions. Attempts to use words to verbalize simple answers. Please assess that she is at her new baseline.   Feeding/swallowing - Patient placed on dysphagia 2 diet, ensure supplements, and had staff assist patient with feeding to help improve PO intake. Please assess nutritional status and make adjustments as necessary.   BP control - Please assess blood pressure control in the setting of her subdural hematoma. Patient discharged on amlodipine 10 mg daily and irbesartan 300 mg daily with a goal systolic BP of <150.   Deconditioning, balance and gait - Patient will be working with PT/OT at [facility]. Please assess for balance and gait issues and explore any interventions that may be appropriate.  Held centrally-acting medications for dementia and anxiety/depression. - Held seroquel 25 mg daily at bedtime, depakote 375 mg TID, memantine 10 mg BID, and rivastigmine 3 mg BID for dementia as well as Sertraline 150 mg daily and home Wellbutrin 300 mg daily for anxiety/depression on admission due to help decrease risk of further seizure activity, somnolence, and confusion that may put her at greater risk of falls. Please assess whether resuming any of these medications is indicated at time of outpatient visit.   Patient Summary Anne [name] is a 72 y.o. female with pertinent PMH of recently diagnosed subdural hematoma, progressive dementia who presented to [facility] ED on the morning of 01/22/2023 after a seizure and was admitted for subdural hematoma with subsequent seizure and somnolence.   Subdural Hematoma secondary to fall from standing with Seizures and Somnolence This is a patient who experienced a fall from standing at home on Saturday morning, 12/14 and she struck the left side of her face on a piece of furniture.  At that time ED workup revealed a subdural hematoma that was stable and not  requiring hospitalization but for which she was started on prophylactic Keppra 500 twice daily in part due to previous hematoma with seizures in October and spontaneous seizures in January.    Now she has recurrent seizures despite taking Keppra and evidence of worsening of her subdural hematoma upon CT at admission. In the ED she was newly somnolent, was not speaking, kept her eyes closed, and only responded to the command to squeeze both hands. Surgery was not required at that time, but overnight EEG was ordered due to further seizure activity in the ED. EEG showed cortical dysfunction of the left hemisphere with diffuse encephalopathy. Patient did not have any witnessed seizures after being admitted. Per neurology, patient remained on PO Keppra 1000 mg BID and IV midazolam 2 mg q4H PRN for rescue. She improved over the course of her week here, and returned to an approximate baseline mental status, wherein she would respond to commands, and answer basic questions with short phrases. Systolic blood pressure goal below 150 was maintained.  Unfortunately on 12/21 she attempted to leave her bed and experienced an unwitnessed fall from bed that caused her to strike her head. This produced both a laceration on the back of her head that was repaired with 2 stitches but also caused a rebleed of her hematoma, as evidenced by a stat CT.  Hematoma had increased from 1 to 1.3 cm and there was new right word midline shift of 6 mm. Patient and patient's family met to discuss with neurosurgery, decided against surgical intervention as she is not an operative candidate for craniotomy for evacuation. Patient's code status was  changed from Full Code to DNR-Limited.   At time of discharge somnolence was significantly improved. Patient responded to voice and was able to converse more clearly, though this was partially limited due to poor dentition. Patient could answer simple questions. Patient was able to follow instruction  and could move all extremities. She was agreeable to continuing to work with PT/OT at [facility].   Patient will be discharged on Keppra 1000 mg BID with neurology follow-up outpatient. Patient's neurosurgeon, [name], is offering an outpatient follow-up in 2 weeks with repeat head CT to evaluate for any signs of liquefaction of the hematoma as this can sometimes make it amenable to bur hole drainage which would be a much smaller intervention for her compared to a craniotomy for evacuation.   Due to their centrally acting mechanisms of action, recommend continuing to hold home medications like seroquel 25 mg daily at bedtime, depakote 375 mg TID, memantine 10 mg BID, and rivastigmine 3 mg BID. These medications were not given throughout hospitalization to help reduce the risk of confusion, somnolence, and seizure.    Dementia Progressive worsening over this past year. Functional decline began before her first fall and subdural hematoma in October. Present baseline is disorientation to time, frequent redirection, occasional irritability, but able to converse. She is not able to perform her own ADLs including grooming and cooking and lately her husband is helping her eat. Patient functional status acutely declined from baseline but improving.PT/OT/SLP spoke with patient's spouse, who agreed with SNF placement for the patient due to increased need of assistance in ADLs and IADLs. Recommend holding home memantine 10 mg daily and rivastigmine 3 mg and resuming outpatient as indicated.     HTN Goal SBP is below 150 per evolving subdural hematoma. Discontinued labetalol during hospitalization due to bradycardia, and avoided nitroprusside due to increased ICP. During hospitalization patient required multiple doses of IV hydralazine to bring BP closer to goal. Systolic BP over last 24 hour prior to discharge more frequently at goal, 120s-140s, without IV hydralazine. Increased home amlodipine to 10 mg and  started patient on ARB/Irbesartan 300 mg given onset time of amlodipine prior to discharge.   Thrombocytopenia  CBC at admission showed platelets 123. This is most likely due to patient's home Depakote. This medication was held during hospitalization due to its centrally acting mechanism of action. Platelets on day of discharge were ***. Recommend outpatient CBC for monitoring.    Depression/Anxiety Held home Sertraline 150 mg daily and home Wellbutrin 300 mg in the setting of seizures. Recommend reevaluating outpatient to determine if safe to resume.    Hypothyroidism Resumed synthroid 88 mcg daily at discharge.***   HLD Resumed rosuvastatin 20 mg daily at discharge.***   ----- Patient Instructions:  - You were seen for seizure associated with a subdural hematoma. You were treated with medications that help to prevent seizures and that help to keep your top blood pressure under 150.  - You were discharged on the following, new medications: -  -  -  -  Please follow directions and continue to take these medications. If you have any issues please discuss with your primary care provider.  - You will need to follow up with your primary care provider in the next 7-10 days. Please call their office and make a hospital follow-up appointment so that they may review any medication changes and obtain necessary labs.  - Please call 911 or go to the nearest emergency department if you have a sudden  change in your thinking, your speech, or your vision, you have a new headache that does not go away, or you feel like there is a new weakness in your arms or legs, as you may be having a medical emergency.  - We are so glad you are feeling better. It was a pleasure serving you during your stay. -Dr. Justin Mend and the Internal Medicine Teaching Service team.   12/28: Hemorrhoids acting up, husband upset as patient was hungry yesterday and had not had food tray close enough to eat.

## 2023-01-22 NOTE — H&P (Signed)
Date: 01/22/2023               Patient Name:  Anne [name] MRN: [id number]  DOB: June 02, 1950 Age / Sex: 71 y.o., female   PCP: Glori Luis, MD         Medical Service: Internal Medicine Teaching Service         Attending Physician: Dr. Ginnie Smart, MD      First Contact: Dr. Katheran James, DO Pager [phone]    Second Contact: Dr. Olegario Messier, MD Pager [phone]         After Hours (After 5p/  First Contact Pager: [phone]  weekends / holidays): Second Contact Pager: [phone]   SUBJECTIVE   Chief Complaint: Seizure  History of Present Illness:  Anne [name] is a 72 yo female with PMH of progressive dementia, tremor, recently diagnosed subdural hematoma, and hypothyroidism who presented to [facility] ED on the morning of 01/22/2023 after a seizure.  The patient experienced a fall at her home on Saturday morning due to loss of balance, and she struck her head on a piece of furniture.  She lost consciousness for a few minutes.  She was evaluated at [facility] ER and diagnosed with a subdural hematoma but discharged home after subsequent imaging showed stability of the bleed.  She was discharged with a prescription for Keppra 500 twice daily which she is taking.  The following day, she slept for much of the day.  After that, yesterday, she was somewhat back to a baseline state, able to eat and rest at home.  This morning her husband found her having a seizure and so they sought further evaluation in the ER.    She is not anticoagulated.  She has had other seizures in the past, including seizure of undetermined origin in January of 2024 and seizure secondary to another fall and subdural hematoma in October of this year.  She does have underlying dementia which impacts her functional status.  At baseline, she is able to converse but is often confused and needs frequent redirection.  She is not typically oriented to time.  She had better functional status earlier this  year, where she was able to take care of herself, but over this year her husband is taking care of most of her ADLs.  History provided by husband at bedside and daughter by phone as patient is unable to communicate.  ED Course: Arrived to [facility], ED at 647 after witnessed seizure from the husband.  Workup revealed normal sugar, valproic acid level of 25, CMP with K of 3.3 and mild transaminitis, CBC unremarkable, PT/INR within normal limits, CT head with increase in acute subdural hematoma that was diagnosed over the weekend, measuring up to 1 cm, no brain shift, but atrophy noted.  Exam was significant for an 8 cm x 8 cm contusion on the left face surrounding the orbit, and a 2 cm x 2 cm hematoma noted over the left forehead, abnormal EOMs and nystagmus were witnessed in the bilateral eyes, conjunctival hemorrhage noted in the left eye.  She was found to be disoriented, weak, alert but nonverbal.  Another tonic-clonic seizure was observed in the ER lasting about 90 seconds with eye deviation to the right.  Neurosurgery evaluated did not find need for immediate surgical intervention.  Neurology evaluated and EEG suggested continued abnormal electrical activity in the brain with commendations for overnight monitoring.  I MTS was paged for admission for seizure-like activity in the setting  of an evolving subdural hematoma..  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   OBJECTIVE:   Physical Exam: Blood pressure (!) 163/79, pulse 71, temperature 98.6 F (37 C), temperature source Axillary, resp. rate 16, SpO2 100%.  Constitutional: Chronically Ill-appearing, somnolent/minimally responsive. In no acute distress. HENT: Normocephalic.  Large contusion is present on the left half of the face superficial laceration above the left eye Eyes: L eye with subconjunctival hemorrhage. Sclera non-icteric, PERRL, EOM intact Cardio:Regular rate and rhythm. Systolic murmur, rubs, or gallops. 2+ bilateral radial and dorsalis pedis  pulses. Pulm:Clear to auscultation bilaterally. Normal work of breathing on room air. Abdomen: Soft, non-tender, non-distended, positive bowel sounds. ZOX:WRUEAVWU for extremity edema. Skin:Warm and dry. Neuro:Alert and oriented x0. Neurologic exam limited by somnolence/cooperation. Pupils are reactive to light. Able to squeeze both hands. Opens eyes to noise but does not answer questions.   Labs: CBC    Component Value Date/Time   WBC 4.2 01/22/2023 0917   RBC 4.17 01/22/2023 0917   HGB 12.6 01/22/2023 0917   HCT 38.2 01/22/2023 0917   PLT 123 (L) 01/22/2023 0917   MCV 91.6 01/22/2023 0917   MCH 30.2 01/22/2023 0917   MCHC 33.0 01/22/2023 0917   RDW 15.2 01/22/2023 0917   LYMPHSABS 0.8 01/22/2023 0917   MONOABS 0.4 01/22/2023 0917   EOSABS 0.0 01/22/2023 0917   BASOSABS 0.0 01/22/2023 0917     CMP     Component Value Date/Time   NA 139 01/22/2023 0917   NA 142 07/17/2021 1347   K 3.3 (L) 01/22/2023  0917   CL 103 01/22/2023 0917   CO2 28 01/22/2023 0917   GLUCOSE 90 01/22/2023 0917   BUN 21 01/22/2023 0917   BUN 12 07/17/2021 1347   CREATININE 0.99 01/22/2023 0917   CREATININE 1.09 02/26/2012 0925   CALCIUM 8.8 (L) 01/22/2023 0917   PROT 5.5 (L) 01/22/2023 0917   PROT 7.1 07/17/2021 1347   ALBUMIN 2.8 (L) 01/22/2023 0917   ALBUMIN 4.5 07/17/2021 1347   AST 83 (H) 01/22/2023 0917   ALT 75 (H) 01/22/2023 0917   ALKPHOS 142 (H) 01/22/2023 0917   BILITOT 0.7 01/22/2023 0917   BILITOT 0.4 07/17/2021 1347   GFRNONAA 60 (L) 01/22/2023 0917   GFRNONAA 54 (L) 02/26/2012 0925   GFRAA >60 11/05/2019 2025   GFRAA >60 02/26/2012 0925    Imaging: EEG adult Result Date: 01/22/2023 Charlsie Quest, MD     01/22/2023 12:26 PM Patient Name: ROZALIE COMOLLI MRN: [id number] Epilepsy Attending: Charlsie Quest Referring Physician/Provider: Lynnae January, NP Date: 01/22/2023 Duration: 25.21 mins Patient history:  72yo F with left SDH, now with seizure. EEG to evaluate for seizure Level of alertness: Awake, asleep AEDs during EEG study: LEV Technical aspects: This EEG study was done with scalp electrodes positioned according to the 10-20 International system of electrode placement. Electrical activity was reviewed with band pass filter of 1-70Hz , sensitivity of 7 uV/mm, display speed of 34mm/sec with a 60Hz  notched filter applied as appropriate. EEG data were recorded continuously and digitally stored.  Video monitoring was available and reviewed as appropriate. Description: No clear posterior dominant rhythm was seen.  Sleep was characterized by sleep symptoms (12 to 14 Hz), maximal frontocentral region. EEG showed continuous generalized 3-5 Hz theta-delta slowing.  Between 1122 to 1124, lateralized periodic discharges were noted in left hemisphere, maximal left frontal region at 1Hz .  On video, patient was noted to be asleep, no clinical signs were noted.  Again around 1133, EEG showed rhythmic 2 to 3 Hz  delta slowing admixed with lateralized periodic discharges in the left hemisphere, maximal left frontal region.  Per EEG tech, "leg twitching" was noted which was difficult to visualize on camera.  At 1136, EEG evolved into sharply contoured 5 to 6 Hz theta slowing admixed with spikes in left frontal region which then involve of left hemisphere and subsequently evolved into 2 to 3 Hz delta slowing.  This EEG pattern is consistent with focal seizure.  Again no definite clinical signs were noted due to limited visibility of video.  Duration of seizure was about 4 minutes Hyperventilation and photic stimulation were not performed.   ABNORMALITY -Focal seizure, left frontal region -Lateralized periodic discharges, left hemisphere, left maximal left frontal region -Continuous slow, generalized IMPRESSION: This study showed one focal seizure arising from left frontal region as described above on 01/22/2023 at 1136, lasting for about 4 minutes.  Additionally EEG showed evidence of epileptogenicity in left hemisphere, maximal effort related with increased risk of seizure recurrence.  Lastly there was moderate to severe diffuse encephalopathy. Recommend long-term EEG for further monitoring of seizures Dr. Selina Cooley was notified. Charlsie Quest   CT Head Wo Contrast Result Date: 01/22/2023 CLINICAL DATA:  Seizure disorder with clinical change. EXAM: CT HEAD WITHOUT CONTRAST TECHNIQUE: Contiguous axial images were obtained from the base of the skull through the vertex without intravenous contrast. RADIATION DOSE REDUCTION: This exam was performed according to the departmental dose-optimization program which includes automated exposure control, adjustment of the mA and/or kV according to patient size and/or use of iterative reconstruction technique. COMPARISON:  Three days ago FINDINGS: Brain: Subdural hematoma on the left, primarily high-density and increased from before. Maximal thickness is 1 cm on coronal reformats with  mild cortical mass effect. The hematoma is seen both laterally and inferior to the left frontal lobe. There is a low-density hygroma appearance in the subdural space at the superior right frontal convexity since prior. A trace high-density subdural hematoma inferior to the right frontal lobe is unchanged at 2 mm thickness. No evidence of underlying infarct. No hydrocephalus or mass. Vascular: Atheromatous calcification. Skull: Unremarkable Sinuses/Orbits: Bilateral cataract resection IMPRESSION: 1. Increase in acute subdural hematoma on the left, measuring up to 1 cm. No shift of the atrophic brain. 2. Subdural hemorrhage inferior to the right frontal lobe is stable and trace when compared to prior. There is a degree of increased/new hygroma at the right vertex since prior. Electronically Signed   By: Tiburcio Pea M.D.   On: 01/22/2023 08:13    ASSESSMENT & PLAN:   Assessment &  Plan by Problem: Principal Problem:   Seizure (HCC)   Nesly Merrifield Crossman is a 72 y.o. female with pertinent PMH of recently diagnosed subdural hematoma, progressive dementia who presented to [facility] ED on the morning of 01/22/2023 after a seizure and is admitted for Subdural hematoma with subsequent seizure and somnolence.  Subdural Hematoma secondary to fall from standing with Seizures and Somnolence This is a patient who experienced a fall from standing at home on Saturday morning, 12/14 where and she struck the left side of her face on a piece of furniture.  At that time ER workup revealed a subdural hematoma that was stable and not requiring hospitalization but for which she was started on prophylactic Keppra 500 twice daily in part due to previous hematoma with seizures in October and spontaneous seizures in January.  Now she has recurrent seizures despite taking Keppra, and evidence of worsening of her subdural hematoma upon CT today.  He is newly somnolent as well, as she is not speaking, keeps her eyes closed, and only responds  to the command to squeeze both hands.  At present time she was not felt to require surgery, but neurology remains involved with plans for overnight EEG as she has demonstrated further seizure activity since arriving in the ER. - Neurology involved, thank you - Continuous EEG starting now through overnight - Systolic blood pressure goal below 150, labetalol 5 mg every 2 hours as needed. Continue amlodipine 5mg . - Repeat CT head planned for tomorrow morning - Continue to monitor neurologic status - Hold Keppra 500mg  BID and hold home Seroquel 25mg  per NPO due to ongoing seizures, hold home Depakote 125mg  to avoid reduction of platelet levels in setting of progressive bleed  Dementia Progressive worsening over this past year.  Functional decline began before her first fall and subdural hematoma in October.  Present baseline is disorientation to time, frequent redirection, occasional irritability, but able to converse.  She is not able to perform her own ADLs including grooming and cooking and lately her husband is helping her eat. - hold home memantine 10mg  daily, Rivastigmine 3mg   HTN Goal SBP is below 150 per evolving subdural hematoma.  - Hold amlodipine 5 mg per NPO but add labetalol 5 every 2 as needed per above  Depression/Anxiety Hold home sertraline 150mg  daily, hold home Wellbutrin 300mg  in setting of seizures  Hypothyroidism Hold synthroid daily  HLD Hold rosuvastatin 20mg  daily   Diet: NPO pending improvement in mental status VTE: SCDs IVF: None,None Code: Full  Dispo: Admit patient to Inpatient with expected length of stay greater than 2 midnights.  Signed: Katheran James, DO Internal Medicine Resident PGY-1  01/22/2023, 2:28 PM   Dr. Katheran James, DO Pager [phone]

## 2023-01-22 NOTE — Progress Notes (Signed)
LTM EEG hooked up and running - no initial skin breakdown - push button tested - Atrium monitoring. No atrium at this time patient is in the ED

## 2023-01-22 NOTE — Telephone Encounter (Signed)
Noted. Will await discharge from hospital.

## 2023-01-22 NOTE — ED Notes (Signed)
Patient currently in MRI.

## 2023-01-22 NOTE — ED Triage Notes (Addendum)
Patient was seen 3 days ago for a fall, diagnosed with a "brain bleed" but ultimately discharged, patient has been having headaches and seizures since. Husband called after witnessed seizure this morning. Patient arrives postictal.

## 2023-01-23 ENCOUNTER — Ambulatory Visit: Payer: PPO | Admitting: Neurosurgery

## 2023-01-23 ENCOUNTER — Inpatient Hospital Stay (HOSPITAL_COMMUNITY): Payer: PPO

## 2023-01-23 DIAGNOSIS — I10 Essential (primary) hypertension: Secondary | ICD-10-CM | POA: Diagnosis not present

## 2023-01-23 DIAGNOSIS — R569 Unspecified convulsions: Secondary | ICD-10-CM | POA: Diagnosis not present

## 2023-01-23 DIAGNOSIS — S065XAA Traumatic subdural hemorrhage with loss of consciousness status unknown, initial encounter: Secondary | ICD-10-CM | POA: Diagnosis not present

## 2023-01-23 LAB — CBC
HCT: 40.3 % (ref 36.0–46.0)
Hemoglobin: 13.3 g/dL (ref 12.0–15.0)
MCH: 30.3 pg (ref 26.0–34.0)
MCHC: 33 g/dL (ref 30.0–36.0)
MCV: 91.8 fL (ref 80.0–100.0)
Platelets: 148 10*3/uL — ABNORMAL LOW (ref 150–400)
RBC: 4.39 MIL/uL (ref 3.87–5.11)
RDW: 15 % (ref 11.5–15.5)
WBC: 5.8 10*3/uL (ref 4.0–10.5)
nRBC: 0 % (ref 0.0–0.2)

## 2023-01-23 LAB — BASIC METABOLIC PANEL
Anion gap: 10 (ref 5–15)
BUN: 20 mg/dL (ref 8–23)
CO2: 28 mmol/L (ref 22–32)
Calcium: 9.2 mg/dL (ref 8.9–10.3)
Chloride: 100 mmol/L (ref 98–111)
Creatinine, Ser: 1.01 mg/dL — ABNORMAL HIGH (ref 0.44–1.00)
GFR, Estimated: 58 mL/min — ABNORMAL LOW (ref >60)
Glucose, Bld: 87 mg/dL (ref 70–99)
Potassium: 3.7 mmol/L (ref 3.5–5.1)
Sodium: 138 mmol/L (ref 135–145)

## 2023-01-23 MED ORDER — MIDAZOLAM HCL 2 MG/2ML IJ SOLN
2.0000 mg | INTRAMUSCULAR | Status: DC | PRN
Start: 2023-01-23 — End: 2023-02-18

## 2023-01-23 MED ORDER — HYDRALAZINE HCL 20 MG/ML IJ SOLN
5.0000 mg | Freq: Once | INTRAMUSCULAR | Status: AC
  Administered 2023-01-23: 5 mg via INTRAVENOUS
  Filled 2023-01-23: qty 1

## 2023-01-23 MED ORDER — AMLODIPINE BESYLATE 5 MG PO TABS
5.0000 mg | ORAL_TABLET | Freq: Every day | ORAL | Status: DC
  Administered 2023-01-23: 5 mg via ORAL
  Filled 2023-01-23 (×2): qty 1

## 2023-01-23 MED ORDER — HYDRALAZINE HCL 20 MG/ML IJ SOLN
10.0000 mg | Freq: Four times a day (QID) | INTRAMUSCULAR | Status: DC
  Filled 2023-01-23: qty 1

## 2023-01-23 MED ORDER — LOSARTAN POTASSIUM 25 MG PO TABS
25.0000 mg | ORAL_TABLET | Freq: Every day | ORAL | Status: DC
  Administered 2023-01-23: 25 mg via ORAL
  Filled 2023-01-23: qty 1

## 2023-01-23 NOTE — Procedures (Incomplete)
Patient Name: Anne [name]  MRN: [id number]  Epilepsy Attending: Charlsie Quest  Referring Physician/Provider: Charlsie Quest, MD  Duration: 01/22/2023 1357 to 1433, 01/23/2023 0836  Patient history: 72yo F with left SDH, now with seizure. EEG to evaluate for seizure   Level of alertness: Awake, asleep   AEDs during EEG study: LEV   Technical aspects: This EEG study was done with scalp electrodes positioned according to the 10-20 International system of electrode placement. Electrical activity was reviewed with band pass filter of 1-70Hz , sensitivity of 7 uV/mm, display speed of 34mm/sec with a 60Hz  notched filter applied as appropriate. EEG data were recorded continuously and digitally stored. Video monitoring was available and reviewed as appropriate.   Description: No clear posterior dominant rhythm was seen.  Sleep was characterized by sleep symptoms (12 to 14 Hz), maximal frontocentral region. EEG showed continuous generalized and lateralized left hemisphere 3-5 Hz theta-delta slowing.   Hyperventilation and photic stimulation were not performed.     Study was not recorded between 12/70/2024 4033 to 01/13/2023 0730 due to technical issues   ABNORMALITY -Continuous slow, generalized and lateralized left hemisphere   IMPRESSION: This study is suggestive of cortical dysfunction in left hemisphere likely secondary to underlying structural abnormality.  Additionally there is moderate to severe diffuse encephalopathy.    [name] [name]

## 2023-01-23 NOTE — Progress Notes (Signed)
Subjective: Patient opens eyes, denies any concerns.  Per husband at bedside, patient is more awake today compared to the last few days.  ROS: Unable to obtain due to poor mental status  Examination  Vital signs in last 24 hours: Temp:  [97.4 F (36.3 C)-98.7 F (37.1 C)] 97.8 F (36.6 C) (12/18 1145) Pulse Rate:  [54-79] 62 (12/18 1315) Resp:  [12-31] 16 (12/18 1315) BP: (100-188)/(72-146) 156/91 (12/18 1315) SpO2:  [91 %-100 %] 97 % (12/18 1315)  General: lying in bed, NAD Eyes: Has a small laceration on left eyebrow secondary to recent fall per husband, also blackish discoloration around left eye Neuro: Opens eyes to verbal stimulation, able to tell me her name, did not answer other orientation questions, did follow simple one-step commands, did not name objects, PERRLA, difficult to assess visual presented like for appears to track examiner in the room, antigravity strength in all 4 extremities  Basic Metabolic Panel: Recent Labs  Lab 01/22/23 0917 01/23/23 0529  NA 139 138  K 3.3* 3.7  CL 103 100  CO2 28 28  GLUCOSE 90 87  BUN 21 20  CREATININE 0.99 1.01*  CALCIUM 8.8* 9.2    CBC: Recent Labs  Lab 01/22/23 0917 01/23/23 0529  WBC 4.2 5.8  NEUTROABS 3.0  --   HGB 12.6 13.3  HCT 38.2 40.3  MCV 91.6 91.8  PLT 123* 148*     Coagulation Studies: Recent Labs    01/22/23 0917  LABPROT 15.2  INR 1.2    Imaging personally reviewed  CT head without contrast 01/22/2023: Increase in acute subdural hematoma on the left, measuring up to 1 cm. No shift of the atrophic brain.  Subdural hemorrhage inferior to the right frontal lobe is stable and trace when compared to prior. There is a degree of increased/new hygroma at the right vertex since prior. 14 g ( ASSESSMENT AND PLAN: 72 year old female with PMH significant for dementia, HTN, HLD, Metastatic bone cancer, lung cancer, depression who was BIB EMS due to seizures early this morning. S/p fall Saturday AM,  diagnosed with SDH at [facility], discharged on Keppra. Today's CT shows increased left SDH and stable R SDH. Spot EEG showed seizures lasting about 4 minutes.  New onset seizures in the setting of underlying SDH -Unfortunately patient was not on video EEG overnight.  However clinically per husband patient is more awake today. -Therefore recommend continuing overnight video eeg to look for seizure recurrence -Continue Keppra 1000 mg twice daily.  Will consider transitioning to p.o. once patient is more awake -Continue seizure precautions -As needed IV Versed for clinical seizures -Discussed plan with patient husband at bedside as well as Dr. Ninetta Lights via secure chat  I have spent a total of  36  minutes with the patient reviewing hospital notes,  test results, labs and examining the patient as well as establishing an assessment and plan.  > 50% of time was spent in direct patient care.     Lindie Spruce Epilepsy [facility] For questions after 5pm please refer to AMION to reach the Neurologist on call

## 2023-01-23 NOTE — ED Notes (Signed)
Response from admitting team to continue labetalol IV and await ST consult

## 2023-01-23 NOTE — ED Notes (Signed)
Pt bed alarm ringing as she was trying to sit up in bed. Pt had ripped off some of the EEG leads. She was redirected back to bed and mittens were applied. Pt has been resting since.

## 2023-01-23 NOTE — Telephone Encounter (Signed)
Pt is currently admitted at [facility]. Discussed with Dr Katrinka Blazing. He would like to see her in 1-2 weeks if they do not operate on her while she is admitted at [facility].

## 2023-01-23 NOTE — Progress Notes (Signed)
    Providing Compassionate, Quality Care - Together   NEUROSURGERY PROGRESS NOTE     S: NAEs o/n    O: EXAM:  BP (!) 159/75   Pulse 66   Temp (!) 97.5 F (36.4 C) (Axillary)   Resp 17   SpO2 95%     Drowsy, will wake easily Responds to voice Attempts to speak, inaudible/not fluent PERRLA EOMI FC, MAE x4 Mixed ecchymosis around L eye   ASSESSMENT:  72 y.o. with   -seizures -interval increase of mixed L SDH   PLAN: -Seizure control per Neurology -Cont supportive care -Cont to monitor neuro status -Call w/ questions/concerns.   Anne [name], [facility]

## 2023-01-23 NOTE — ED Notes (Addendum)
Patient will not swallow the amlodipine pill.. she did not want the applesauce but she was willing to take it for the medicine... she then just held it in her mouth... she did swallow but did not swallow the medication. She was trying to get it out of her mouth. This paramedic tried to give her water after to swallow it but she would not do it. Pill was removed from her tongue and now patient has hiccups. Admitting MD aware. Pt was given medication crushed in applesauce

## 2023-01-23 NOTE — ED Notes (Signed)
Md updated on bp. No new orders

## 2023-01-23 NOTE — ED Notes (Signed)
Mittens removed, pt husband in room

## 2023-01-23 NOTE — Evaluation (Signed)
Clinical/Bedside Swallow Evaluation Patient Details  Name: Anne [name] MRN: [id number] Date of Birth: 05-06-1950  Today's Date: 01/23/2023 Time: SLP Start Time (ACUTE ONLY): 1154 SLP Stop Time (ACUTE ONLY): 1208 SLP Time Calculation (min) (ACUTE ONLY): 14 min  Past Medical History:  Past Medical History:  Diagnosis Date   Arthritis    Cirrhosis (HCC)    Dementia (HCC)    Depression    GERD (gastroesophageal reflux disease)    Hyperlipidemia    Hypertension    Lung cancer (HCC)    Metastatic bone cancer    Past Surgical History:  Past Surgical History:  Procedure Laterality Date   APPENDECTOMY  1971   CHOLECYSTECTOMY  1971   COLONOSCOPY WITH PROPOFOL N/A 11/15/2016   Procedure: COLONOSCOPY WITH PROPOFOL;  Surgeon: Wyline Mood, MD;  Location: [facility] ENDOSCOPY;  Service: Gastroenterology;  Laterality: N/A;   ENDOBRONCHIAL ULTRASOUND Right 07/30/2018   Procedure: ENDOBRONCHIAL ULTRASOUND;  Surgeon: Shane Crutch, MD;  Location: [facility] ORS;  Service: Pulmonary;  Laterality: Right;   ESOPHAGOGASTRODUODENOSCOPY (EGD) WITH PROPOFOL N/A 01/07/2017   Procedure: ESOPHAGOGASTRODUODENOSCOPY (EGD) WITH PROPOFOL;  Surgeon: Wyline Mood, MD;  Location: [facility] ENDOSCOPY;  Service: Gastroenterology;  Laterality: N/A;   LAPAROSCOPY N/A 03/01/2017   Procedure: LAPAROSCOPY DIAGNOSTIC;  Surgeon: Earline Mayotte, MD;  Location: [facility] ORS;  Service: General;  Laterality: N/A;   PORTA CATH INSERTION N/A 08/14/2018   Procedure: PORTA CATH INSERTION;  Surgeon: Annice Needy, MD;  Location: [facility] INVASIVE CV LAB;  Service: Cardiovascular;  Laterality: N/A;   TONSILECTOMY, ADENOIDECTOMY, BILATERAL MYRINGOTOMY AND TUBES  1955   TONSILLECTOMY     VENTRAL HERNIA REPAIR N/A 03/01/2017   10 x 14 CM Ventralight ST mesh, intraperitoneal location.    VENTRAL HERNIA REPAIR N/A 03/01/2017   Procedure: HERNIA REPAIR VENTRAL ADULT;  Surgeon: Earline Mayotte, MD;  Location: [facility] ORS;  Service: General;  Laterality:  N/A;   HPI:  Anne [name] is a 72 y.o. with a pertinent PMH of recently diagnosed subdural hematoma, seizure disorder, progressive dementia, hypertension, and hypothyroidism who presented after a seizure. In the ED patient had another tonic clonic seizure and CT showed increase in her previous subdural hematoma with left shift. Patient was admitted for seizure-like activity in the setting of evolving subdural hematoma.    Assessment / Plan / Recommendation  Clinical Impression  Pt lethargic but rousable for participation. Husband at the bedside. Minimal verbal output. Followed simple commands. Oral mechanism exam WFL; has upper dentures in place.  Pt accepted ice chips, small sips then sequential swallows of water via a straw, bites of applesauce without difficulty.  There were no concerns for aspiration.  Did not assess advanced solids given how sleepy she was. Recommend starting a dysphagia 1 diet, thin liquids; give meds whole in puree. Anticipate returning to a regular diet as MS returns to baseline. SLP will follow briefly. D/W RN and messaged medical team. SLP Visit Diagnosis: Dysphagia, unspecified (R13.10)    Aspiration Risk   Likely mild   Diet Recommendation   Thin;Dysphagia 1 (puree)  Medication Administration: Whole meds with puree    Other  Recommendations Oral Care Recommendations: Oral care BID    Recommendations for follow up therapy are one component of a multi-disciplinary discharge planning process, led by the attending physician.  Recommendations may be updated based on patient status, additional functional criteria and insurance authorization.  Follow up Recommendations No SLP follow up  Frequency and Duration min 2x/week  1 week       Prognosis Prognosis for improved oropharyngeal function: Good      Swallow Study   General Date of Onset: 01/22/23 HPI: DESHIRA VOGELMAN is a 72 y.o. with a pertinent PMH of recently diagnosed subdural hematoma,  seizure disorder, progressive dementia, hypertension, and hypothyroidism who presented after a seizure. In the ED patient had another tonic clonic seizure and CT showed increase in her previous subdural hematoma with left shift. Patient was admitted for seizure-like activity in the setting of evolving subdural hematoma. Type of Study: Bedside Swallow Evaluation Previous Swallow Assessment: no Diet Prior to this Study: NPO Temperature Spikes Noted: No Respiratory Status: Room air History of Recent Intubation: No Behavior/Cognition: Lethargic/Drowsy Oral Cavity Assessment: Dry Oral Care Completed by SLP: Recent completion by staff Oral Cavity - Dentition: Dentures, top Self-Feeding Abilities: Needs set up;Needs assist Patient Positioning: Upright in bed Baseline Vocal Quality: Hoarse Volitional Cough: Weak Volitional Swallow: Able to elicit    Oral/Motor/Sensory Function Overall Oral Motor/Sensory Function: Within functional limits   Ice Chips Ice chips: Within functional limits   Thin Liquid Thin Liquid: Within functional limits    Nectar Thick Nectar Thick Liquid: Not tested   Honey Thick Honey Thick Liquid: Not tested   Puree Puree: Within functional limits   Solid     Solid: Not tested      Anne [name] 01/23/2023,1:53 PM   Anne Folks L. Samson Frederic, MA CCC/SLP Clinical Specialist - Acute Care SLP Acute Rehabilitation Services Office number [phone]

## 2023-01-23 NOTE — ED Notes (Signed)
Pt bp in the 170s after labetolol. MD updated with new order to monitor bp trending

## 2023-01-23 NOTE — Progress Notes (Signed)
Study back online and recording.  Reapplied all electrodes due to pt removing them.  All impedances below 10kohms.  Hand mitts placed on patients hands by RN  No skin breakdown noted at FP1 FP2  C3  C4  FZ

## 2023-01-23 NOTE — Progress Notes (Signed)
Received pt on unit and got pt comfortable on unit. CCMD called to initiate tele monitoring. Family at bedside

## 2023-01-23 NOTE — ED Notes (Signed)
EEG at bedside.

## 2023-01-23 NOTE — ED Notes (Signed)
Messaged md about pt HTN. Awaiting orders

## 2023-01-23 NOTE — ED Notes (Signed)
ED TO INPATIENT HANDOFF REPORT  ED Nurse Name and Phone #: Howell Pringle [phone]  S Name/Age/Gender Anne [name] 72 y.o. female Room/Bed: 012C/012C  Code Status   Code Status: Full Code  Home/SNF/Other Home Patient oriented to: self and place Is this baseline? No   Triage Complete: Triage complete  Chief Complaint Seizure [facility]) [R56.9]  Triage Note Patient was seen 3 days ago for a fall, diagnosed with a "brain bleed" but ultimately discharged, patient has been having headaches and seizures since. Husband called after witnessed seizure this morning. Patient arrives postictal.    Allergies Allergies  Allergen Reactions   Bee Venom Swelling    Level of Care/Admitting Diagnosis ED Disposition     ED Disposition  Admit   Condition  --   Comment  Hospital Area: [facility] [[id number]]  Level of Care: Progressive [102]  Admit to Progressive based on following criteria: NEUROLOGICAL AND NEUROSURGICAL complex patients with significant risk of instability, who do not meet ICU criteria, yet require close observation or frequent assessment (< / = every 2 - 4 hours) with medical / nursing intervention.  May admit patient to [facility] or [facility] if equivalent level of care is available:: No  Covid Evaluation: Asymptomatic - no recent exposure (last 10 days) testing not required  Diagnosis: Seizure (HCC) [205090]  Admitting Physician: Ginnie Smart [[id number]]  Attending Physician: Ninetta Lights, JEFFREY C [[id number]]  Certification:: I certify this patient will need inpatient services for at least 2 midnights  Expected Medical Readiness: 01/25/2023          B Medical/Surgery History Past Medical History:  Diagnosis Date   Arthritis    Cirrhosis (HCC)    Dementia (HCC)    Depression    GERD (gastroesophageal reflux disease)    Hyperlipidemia    Hypertension    Lung cancer (HCC)    Metastatic bone cancer    Past Surgical History:  Procedure Laterality  Date   APPENDECTOMY  1971   CHOLECYSTECTOMY  1971   COLONOSCOPY WITH PROPOFOL N/A 11/15/2016   Procedure: COLONOSCOPY WITH PROPOFOL;  Surgeon: Wyline Mood, MD;  Location: [facility] ENDOSCOPY;  Service: Gastroenterology;  Laterality: N/A;   ENDOBRONCHIAL ULTRASOUND Right 07/30/2018   Procedure: ENDOBRONCHIAL ULTRASOUND;  Surgeon: Shane Crutch, MD;  Location: [facility] ORS;  Service: Pulmonary;  Laterality: Right;   ESOPHAGOGASTRODUODENOSCOPY (EGD) WITH PROPOFOL N/A 01/07/2017   Procedure: ESOPHAGOGASTRODUODENOSCOPY (EGD) WITH PROPOFOL;  Surgeon: Wyline Mood, MD;  Location: [facility] ENDOSCOPY;  Service: Gastroenterology;  Laterality: N/A;   LAPAROSCOPY N/A 03/01/2017   Procedure: LAPAROSCOPY DIAGNOSTIC;  Surgeon: Earline Mayotte, MD;  Location: [facility] ORS;  Service: General;  Laterality: N/A;   PORTA CATH INSERTION N/A 08/14/2018   Procedure: PORTA CATH INSERTION;  Surgeon: Annice Needy, MD;  Location: [facility] INVASIVE CV LAB;  Service: Cardiovascular;  Laterality: N/A;   TONSILECTOMY, ADENOIDECTOMY, BILATERAL MYRINGOTOMY AND TUBES  1955   TONSILLECTOMY     VENTRAL HERNIA REPAIR N/A 03/01/2017   10 x 14 CM Ventralight ST mesh, intraperitoneal location.    VENTRAL HERNIA REPAIR N/A 03/01/2017   Procedure: HERNIA REPAIR VENTRAL ADULT;  Surgeon: Earline Mayotte, MD;  Location: [facility] ORS;  Service: General;  Laterality: N/A;     A IV Location/Drains/Wounds Patient Lines/Drains/Airways Status     Active Line/Drains/Airways     Name Placement date Placement time Site Days   Peripheral IV 01/22/23 18 G Left Antecubital 01/22/23  0705  Antecubital   1   Wound /  Incision (Open or Dehisced) 10/29/18 Other (Comment) Leg Left;Posterior;Upper Boil 10/29/18  0058  Leg  1547            Intake/Output Last 24 hours  Intake/Output Summary (Last 24 hours) at 01/23/2023 1150 Last data filed at 01/22/2023 1957 Gross per 24 hour  Intake 100 ml  Output --  Net 100 ml    Labs/Imaging Results for orders  placed or performed during the hospital encounter of 01/22/23 (from the past 48 hours)  CBG monitoring, ED     Status: None   Collection Time: 01/22/23  8:51 AM  Result Value Ref Range   Glucose-Capillary 81 70 - 99 mg/dL    Comment: Glucose reference range applies only to samples taken after fasting for at least 8 hours.  Valproic acid level     Status: Abnormal   Collection Time: 01/22/23  9:17 AM  Result Value Ref Range   Valproic Acid Lvl 25 (L) 50.0 - 100.0 ug/mL    Comment: Performed at [facility], [address] [place], [name] [address]  Comprehensive metabolic panel     Status: Abnormal   Collection Time: 01/22/23  9:17 AM  Result Value Ref Range   Sodium 139 135 - 145 mmol/L   Potassium 3.3 (L) 3.5 - 5.1 mmol/L   Chloride 103 98 - 111 mmol/L   CO2 28 22 - 32 mmol/L   Glucose, Bld 90 70 - 99 mg/dL    Comment: Glucose reference range applies only to samples taken after fasting for at least 8 hours.   BUN 21 8 - 23 mg/dL   Creatinine, Ser 6.04 0.44 - 1.00 mg/dL   Calcium 8.8 (L) 8.9 - 10.3 mg/dL   Total Protein 5.5 (L) 6.5 - 8.1 g/dL   Albumin 2.8 (L) 3.5 - 5.0 g/dL   AST 83 (H) 15 - 41 U/L   ALT 75 (H) 0 - 44 U/L   Alkaline Phosphatase 142 (H) 38 - 126 U/L   Total Bilirubin 0.7 <1.2 mg/dL   GFR, Estimated 60 (L) >60 mL/min    Comment: (NOTE) Calculated using the CKD-EPI Creatinine Equation (2021)    Anion gap 8 5 - 15    Comment: Performed at [facility], [address] [place], [name] [address]  CBC with Differential/Platelet     Status: Abnormal   Collection Time: 01/22/23  9:17 AM  Result Value Ref Range   WBC 4.2 4.0 - 10.5 K/uL   RBC 4.17 3.87 - 5.11 MIL/uL   Hemoglobin 12.6 12.0 - 15.0 g/dL   HCT 11.9 14.7 - 82.9 %   MCV 91.6 80.0 - 100.0 fL   MCH 30.2 26.0 - 34.0 pg   MCHC 33.0 30.0 - 36.0 g/dL   RDW 56.2 13.0 - 86.5 %   Platelets 123 (L) 150 - 400 K/uL   nRBC 0.0 0.0 - 0.2 %   Neutrophils Relative % 71 %   Neutro Abs 3.0 1.7 - 7.7  K/uL   Lymphocytes Relative 19 %   Lymphs Abs 0.8 0.7 - 4.0 K/uL   Monocytes Relative 8 %   Monocytes Absolute 0.4 0.1 - 1.0 K/uL   Eosinophils Relative 1 %   Eosinophils Absolute 0.0 0.0 - 0.5 K/uL   Basophils Relative 1 %   Basophils Absolute 0.0 0.0 - 0.1 K/uL   Immature Granulocytes 0 %   Abs Immature Granulocytes 0.01 0.00 - 0.07 K/uL    Comment: Performed at [facility], [address] [place], [name] [address]  Protime-INR     Status: None   Collection Time: 01/22/23  9:17 AM  Result Value Ref Range   Prothrombin Time 15.2 11.4 - 15.2 seconds   INR 1.2 0.8 - 1.2    Comment: (NOTE) INR goal varies based on device and disease states. Performed at [facility], [address], [place], [name] [address]   Basic metabolic panel     Status: Abnormal   Collection Time: 01/23/23  5:29 AM  Result Value Ref Range   Sodium 138 135 - 145 mmol/L   Potassium 3.7 3.5 - 5.1 mmol/L   Chloride 100 98 - 111 mmol/L   CO2 28 22 - 32 mmol/L   Glucose, Bld 87 70 - 99 mg/dL    Comment: Glucose reference range applies only to samples taken after fasting for at least 8 hours.   BUN 20 8 - 23 mg/dL   Creatinine, Ser 8.84 (H) 0.44 - 1.00 mg/dL   Calcium 9.2 8.9 - 16.6 mg/dL   GFR, Estimated 58 (L) >60 mL/min    Comment: (NOTE) Calculated using the CKD-EPI Creatinine Equation (2021)    Anion gap 10 5 - 15    Comment: Performed at [facility], [address] [place], [name] [address]  CBC     Status: Abnormal   Collection Time: 01/23/23  5:29 AM  Result Value Ref Range   WBC 5.8 4.0 - 10.5 K/uL   RBC 4.39 3.87 - 5.11 MIL/uL   Hemoglobin 13.3 12.0 - 15.0 g/dL   HCT 60.1 09.3 - 23.5 %   MCV 91.8 80.0 - 100.0 fL   MCH 30.3 26.0 - 34.0 pg   MCHC 33.0 30.0 - 36.0 g/dL   RDW 57.3 22.0 - 25.4 %   Platelets 148 (L) 150 - 400 K/uL   nRBC 0.0 0.0 - 0.2 %    Comment: Performed at [facility], [address] [place], [name] [address]   *Note: Due to a large  number of results and/or encounters for the requested time period, some results have not been displayed. A complete set of results can be found in Results Review.   EEG adult Result Date: 01/22/2023 Charlsie Quest, MD     01/23/2023 10:08 AM Patient Name: Anne [name] MRN: [id number] Epilepsy Attending: Charlsie Quest Referring Physician/Provider: Lynnae January, NP Date: 01/22/2023 Duration: 25.21 mins Patient history: 72yo F with left SDH, now with seizure. EEG to evaluate for seizure Level of alertness: Awake, asleep AEDs during EEG study: LEV Technical aspects: This EEG study was done with scalp electrodes positioned according to the 10-20 International system of electrode placement. Electrical activity was reviewed with band pass filter of 1-70Hz , sensitivity of 7 uV/mm, display speed of 93mm/sec with a 60Hz  notched filter applied as appropriate. EEG data were recorded continuously and digitally stored.  Video monitoring was available and reviewed as appropriate. Description: No clear posterior dominant rhythm was seen.  Sleep was characterized by sleep symptoms (12 to 14 Hz), maximal frontocentral region. EEG showed continuous generalized 3-5 Hz theta-delta slowing.  Between 1122 to 1124, lateralized periodic discharges were noted in left hemisphere, maximal left frontal region at 1Hz .  On video, patient was noted to be asleep, no clinical signs were noted.  Again around 1133, EEG showed rhythmic 2 to 3 Hz delta slowing admixed with lateralized periodic discharges in the left hemisphere, maximal left frontal  region.  Per EEG tech, "leg twitching" was noted which was difficult to visualize on camera.  At 1136, EEG evolved into sharply contoured 5 to 6 Hz theta slowing admixed with spikes in left frontal region which then involve of left hemisphere and subsequently evolved into 2 to 3 Hz delta slowing.  This EEG pattern is consistent with focal seizure.  Again no definite clinical signs were noted due  to limited visibility of video.  Duration of seizure was about 4 minutes Hyperventilation and photic stimulation were not performed.   ABNORMALITY -Focal seizure, left frontal region -Lateralized periodic discharges, left hemisphere, left maximal left frontal region -Continuous slow, generalized IMPRESSION: This study showed one focal seizure arising from left frontal region as described above on 01/22/2023 at 1136, lasting for about 4 minutes.  Additionally EEG showed evidence of epileptogenicity in left hemisphere, maximal left frontal region with increased risk of seizure recurrence.  Lastly there was moderate to severe diffuse encephalopathy. Recommend long-term EEG for further monitoring of seizures Dr. Selina Cooley was notified. Charlsie Quest   CT Head Wo Contrast Result Date: 01/22/2023 CLINICAL DATA:  Seizure disorder with clinical change. EXAM: CT HEAD WITHOUT CONTRAST TECHNIQUE: Contiguous axial images were obtained from the base of the skull through the vertex without intravenous contrast. RADIATION DOSE REDUCTION: This exam was performed according to the departmental dose-optimization program which includes automated exposure control, adjustment of the mA and/or kV according to patient size and/or use of iterative reconstruction technique. COMPARISON:  Three days ago FINDINGS: Brain: Subdural hematoma on the left, primarily high-density and increased from before. Maximal thickness is 1 cm on coronal reformats with mild cortical mass effect. The hematoma is seen both laterally and inferior to the left frontal lobe. There is a low-density hygroma appearance in the subdural space at the superior right frontal convexity since prior. A trace high-density subdural hematoma inferior to the right frontal lobe is unchanged at 2 mm thickness. No evidence of underlying infarct. No hydrocephalus or mass. Vascular: Atheromatous calcification. Skull: Unremarkable Sinuses/Orbits: Bilateral cataract resection  IMPRESSION: 1. Increase in acute subdural hematoma on the left, measuring up to 1 cm. No shift of the atrophic brain. 2. Subdural hemorrhage inferior to the right frontal lobe is stable and trace when compared to prior. There is a degree of increased/new hygroma at the right vertex since prior. Electronically Signed   By: Tiburcio Pea M.D.   On: 01/22/2023 08:13    Pending Labs Unresulted Labs (From admission, onward)     Start     Ordered   01/24/23 0500  Basic metabolic panel  Tomorrow morning,   R        01/23/23 1050   01/24/23 0500  CBC  Tomorrow morning,   R        01/23/23 1050            Vitals/Pain Today's Vitals   01/23/23 1045 01/23/23 1100 01/23/23 1115 01/23/23 1145  BP: (!) 162/78 (!) 162/77 (!) 158/85 (!) 162/83  Pulse: 65 64 65 60  Resp:  20 19 (!) 31  Temp:    97.8 F (36.6 C)  TempSrc:    Axillary  SpO2: 93% 95% 93% 94%    Isolation Precautions No active isolations  Medications Medications  levETIRAcetam (KEPPRA) IVPB 1000 mg/100 mL premix (0 mg Intravenous Stopped 01/23/23 0927)  labetalol (NORMODYNE) injection 10 mg (10 mg Intravenous Given 01/23/23 1133)  LORazepam (ATIVAN) injection 2 mg (2 mg Intravenous Given 01/22/23 0718)  levETIRAcetam (KEPPRA) IVPB 1000 mg/100 mL premix (0 mg Intravenous Stopped 01/22/23 0853)    Followed by  levETIRAcetam (KEPPRA) IVPB 1000 mg/100 mL premix (0 mg Intravenous Stopped 01/22/23 0854)  hydrALAZINE (APRESOLINE) injection 5 mg (5 mg Intravenous Given 01/23/23 0815)    Mobility walks with device     Focused Assessments     R Recommendations: See Admitting Provider Note  Report given to:   Additional Notes:

## 2023-01-23 NOTE — Progress Notes (Signed)
HD#1 SUBJECTIVE:  Patient Summary: Anne [name] is a 72 y.o. with a pertinent PMH of recently diagnosed subdural hematoma, seizure disorder, progressive dementia, hypertension, and hypothyroidism who presented after a seizure. In the ED patient had another tonic clonic seizure and CT showed increase in her previous subdural hematoma with left shift. Patient was admitted for seizure-like activity in the setting of evolving subdural hematoma.   Overnight Events: Was not able to obtain goal BP systolic <150. Patient was given multiple pushes of 5 mg and 10 mg IV labetalol without reaching goal BP. Patient not able to tolerate meds po.   Interim History: Patient was seen sleeping in bed. She awoke to physical stimulus and responded to name. Patient was able to answer some yes or no questions and was oriented to place. She denied pain, and was able to follow simple instructions to mover her upper extremities. She was somnolent and required repetitive physical stimulus to stay awake during the interview.   OBJECTIVE:  Vital Signs: Vitals:   01/23/23 0700 01/23/23 0808 01/23/23 0815 01/23/23 0823  BP: (!) 164/86 (!) 172/81 (!) 172/81 (!) 159/75  Pulse: 62 67  66  Resp: 14 16  17   Temp:  (!) 97.5 F (36.4 C)    TempSrc:  Axillary    SpO2: 92% 95%     Supplemental O2: Nasal Cannula SpO2: 95 % O2 Flow Rate (L/min): 2 L/min  There were no vitals filed for this visit.   Intake/Output Summary (Last 24 hours) at 01/23/2023 0906 Last data filed at 01/22/2023 1957 Gross per 24 hour  Intake 100 ml  Output --  Net 100 ml   Net IO Since Admission: 280 mL [01/23/23 0906]  Physical Exam: Physical Exam Vitals and nursing note reviewed.  Constitutional:      General: She is not in acute distress.    Appearance: She is not diaphoretic.     Comments: somnolent  HENT:     Head:     Comments: Large contusion around the left orbit with a 2 cm laceration at the left eyebrow.  Eyes:      Pupils: Pupils are equal, round, and reactive to light.  Cardiovascular:     Rate and Rhythm: Normal rate and regular rhythm.     Pulses:          Radial pulses are 2+ on the right side and 2+ on the left side.     Heart sounds: Murmur heard.     Systolic murmur is present with a grade of 2/6.     No friction rub. No gallop.  Abdominal:     General: Bowel sounds are normal.  Musculoskeletal:     Right lower leg: No edema.     Left lower leg: No edema.  Neurological:     Comments: Wakes with physical stimulus and oriented x2, self and place. Neurologic exam limited by somnolence/cooperation. Pupils are reactive to light. Able to squeeze both hands. Opens eyes to touch and able to answer some yes/no questions.     Patient Lines/Drains/Airways Status     Active Line/Drains/Airways     Name Placement date Placement time Site Days   Peripheral IV 01/22/23 18 G Left Antecubital 01/22/23  0705  Antecubital   1   Wound / Incision (Open or Dehisced) 10/29/18 Other (Comment) Leg Left;Posterior;Upper Boil 10/29/18  0058  Leg  1547             ASSESSMENT/PLAN:  Assessment: Principal Problem:  Seizure [facility]) Active Problems:   Subdural hematoma (HCC)   Plan: Anne [name] is a 72 y.o. female with pertinent PMH of recently diagnosed subdural hematoma, progressive dementia who presented to [facility] ED on the morning of 01/22/2023 after a seizure and is admitted for Subdural hematoma with subsequent seizure and somnolence.   Subdural Hematoma secondary to fall from standing with Seizures and Somnolence This is a patient who experienced a fall from standing at home on Saturday morning, 12/14 and she struck the left side of her face on a piece of furniture.  At that time ED workup revealed a subdural hematoma that was stable and not requiring hospitalization but for which she was started on prophylactic Keppra 500 twice daily in part due to previous hematoma with seizures in October and  spontaneous seizures in January.  Now she has recurrent seizures despite taking Keppra, and evidence of worsening of her subdural hematoma upon CT. At admission she was newly somnolent as well, as she was not speaking, kept her eyes closed, and only responds to the command to squeeze both hands. At that time she was not felt to require surgery, but neurology remains involved and ordered overnight EEG as she has demonstrated further seizure activity since arriving in the ER. Still somnolent today, but awoken by touch and able to respond to some yes or no questions. She was also oriented to place this morning and able to follow simple commands.  - Neurology involved, thank you - Continuous EEG restarted this morning after patient removed EEG leads - Systolic blood pressure goal below 150, labetalol 10 mg every 2 hours as needed. Added 5 mg hydralazine to attempt to reach goal BP. - Repeat CT head today  - Continue to monitor neurologic status - Hold home Seroquel 25mg  per NPO due to ongoing seizures, hold home Depakote 125mg  to avoid reduction of platelet levels in setting of progressive bleed - Keppra 1000 mg IV Q12H   Dementia Progressive worsening over this past year.  Functional decline began before her first fall and subdural hematoma in October.  Present baseline is disorientation to time, frequent redirection, occasional irritability, but able to converse. She is not able to perform her own ADLs including grooming and cooking and lately her husband is helping her eat. Patient functional status acutely declined and currently cannot tolerate anything PO.  - hold home memantine 10mg  daily, Rivastigmine 3mg    #Uncontrolled HTN Goal SBP is below 150 per evolving subdural hematoma. Goal not reached despite multiple doses of IV labetalol. Avoiding more frequent or increased dose of labetalol due to bradycardia, and avoiding nitroprusside due to increased ICP. Ordered one time dose of hydralazine and will  continue to monitor BP to see how patient tolerates it. If not able to get BP in goal range may have to transfer patient to neuro ICU for nicardipine drip.  - Hold amlodipine 5 mg per NPO but add labetalol 5 every 2 as needed per above - Added hydralazine 5 mg injection one time, will monitor BP for tolerance.  Depression/Anxiety Hold home sertraline 150mg  daily, hold home Wellbutrin 300mg  in setting of seizures   Hypothyroidism Hold synthroid daily   HLD Hold rosuvastatin 20mg  daily   Best Practice: Diet: NPO IVF: Fluids: None, Rate: None VTE: SCDs Start: 01/22/23 1400 Code: Full AB: None Therapy Recs:  N/a , DME: none  Family Contact: Husband, to be notified. DISPO: Anticipated discharge in greater than 2 days to Skilled nursing facility pending  seizure activity work up and evolving subdural hematoma .  Signature: Ezekiel Slocumb  Medical Student, MS3 Pager: [phone] 9:06 AM, 01/23/2023

## 2023-01-23 NOTE — Progress Notes (Signed)
LTM maint complete - no skin breakdown seen. Serviced several leads. Impedance below 10k[employer]ms [facility] monitored, Event button test confirmed by [facility].

## 2023-01-23 NOTE — ED Notes (Signed)
Pt resting but flailing her arms about.

## 2023-01-23 NOTE — ED Notes (Signed)
Pt remains resting but still moving her arms all around. Admitting team messaged regarding BP systolic remaining over 150 despite labetalol. Awaiting response at this time

## 2023-01-23 NOTE — ED Notes (Signed)
Md updated on pt HTN AWAITING NEW ORDERS

## 2023-01-23 NOTE — ED Notes (Signed)
Neurology at bedside with husband

## 2023-01-24 ENCOUNTER — Other Ambulatory Visit (HOSPITAL_COMMUNITY): Payer: Self-pay

## 2023-01-24 ENCOUNTER — Encounter: Payer: Self-pay | Admitting: Oncology

## 2023-01-24 ENCOUNTER — Inpatient Hospital Stay (HOSPITAL_COMMUNITY): Payer: PPO

## 2023-01-24 ENCOUNTER — Telehealth (HOSPITAL_COMMUNITY): Payer: Self-pay

## 2023-01-24 DIAGNOSIS — I10 Essential (primary) hypertension: Secondary | ICD-10-CM | POA: Diagnosis not present

## 2023-01-24 DIAGNOSIS — F0390 Unspecified dementia without behavioral disturbance: Secondary | ICD-10-CM | POA: Diagnosis not present

## 2023-01-24 DIAGNOSIS — R569 Unspecified convulsions: Secondary | ICD-10-CM | POA: Diagnosis not present

## 2023-01-24 LAB — CBC
HCT: 40.9 % (ref 36.0–46.0)
Hemoglobin: 13.6 g/dL (ref 12.0–15.0)
MCH: 29.9 pg (ref 26.0–34.0)
MCHC: 33.3 g/dL (ref 30.0–36.0)
MCV: 89.9 fL (ref 80.0–100.0)
Platelets: 131 10*3/uL — ABNORMAL LOW (ref 150–400)
RBC: 4.55 MIL/uL (ref 3.87–5.11)
RDW: 14.8 % (ref 11.5–15.5)
WBC: 6.3 10*3/uL (ref 4.0–10.5)
nRBC: 0 % (ref 0.0–0.2)

## 2023-01-24 LAB — BASIC METABOLIC PANEL
Anion gap: 8 (ref 5–15)
BUN: 22 mg/dL (ref 8–23)
CO2: 28 mmol/L (ref 22–32)
Calcium: 9 mg/dL (ref 8.9–10.3)
Chloride: 98 mmol/L (ref 98–111)
Creatinine, Ser: 0.81 mg/dL (ref 0.44–1.00)
GFR, Estimated: >60 mL/min (ref >60)
Glucose, Bld: 79 mg/dL (ref 70–99)
Potassium: 3.7 mmol/L (ref 3.5–5.1)
Sodium: 134 mmol/L — ABNORMAL LOW (ref 135–145)

## 2023-01-24 MED ORDER — AMLODIPINE BESYLATE 5 MG PO TABS
10.0000 mg | ORAL_TABLET | Freq: Every day | ORAL | Status: DC
  Administered 2023-01-24 – 2023-01-30 (×7): 10 mg via ORAL
  Filled 2023-01-24 (×7): qty 2

## 2023-01-24 MED ORDER — LEVETIRACETAM 500 MG PO TABS
1000.0000 mg | ORAL_TABLET | Freq: Two times a day (BID) | ORAL | Status: DC
  Administered 2023-01-24 – 2023-02-18 (×50): 1000 mg via ORAL
  Filled 2023-01-24 (×18): qty 2
  Filled 2023-01-24: qty 4
  Filled 2023-01-24 (×31): qty 2

## 2023-01-24 MED ORDER — LEVETIRACETAM 1000 MG PO TABS
1000.0000 mg | ORAL_TABLET | Freq: Two times a day (BID) | ORAL | 2 refills | Status: DC
  Filled 2023-01-24 – 2023-02-18 (×2): qty 60, 30d supply, fill #0

## 2023-01-24 MED ORDER — HYDRALAZINE HCL 10 MG PO TABS
10.0000 mg | ORAL_TABLET | Freq: Once | ORAL | Status: AC
  Administered 2023-01-24: 10 mg via ORAL
  Filled 2023-01-24: qty 1

## 2023-01-24 MED ORDER — CARMEX CLASSIC LIP BALM EX OINT
TOPICAL_OINTMENT | CUTANEOUS | Status: DC | PRN
  Filled 2023-01-24: qty 10

## 2023-01-24 MED ORDER — NAYZILAM 5 MG/0.1ML NA SOLN
5.0000 mg | NASAL | 2 refills | Status: DC | PRN
  Filled 2023-01-24 – 2023-02-21 (×2): qty 2, 30d supply, fill #0
  Filled 2023-02-25 – 2023-03-08 (×2): qty 2, 6d supply, fill #0

## 2023-01-24 MED ORDER — IRBESARTAN 150 MG PO TABS
150.0000 mg | ORAL_TABLET | Freq: Every day | ORAL | Status: DC
  Administered 2023-01-24: 150 mg via ORAL
  Filled 2023-01-24: qty 1

## 2023-01-24 MED ORDER — HYDRALAZINE HCL 20 MG/ML IJ SOLN
2.0000 mg | INTRAMUSCULAR | Status: DC | PRN
  Administered 2023-01-27: 2 mg via INTRAVENOUS
  Filled 2023-01-24: qty 1

## 2023-01-24 NOTE — TOC Initial Note (Signed)
Transition of Care [facility]) - Initial/Assessment Note    Patient Details  Name: Anne [name] MRN: [id number] Date of Birth: 13-May-1950  Transition of [facility]) CM/SW Contact:    Kermit Balo, RN Phone Number: 01/24/2023, 1:14 PM  Clinical Narrative:                  Cm met with the patient and her spouse at the bedside. Spouse says he can not care for her at home until she is stronger.  He is in agreement with SNF rehab. He is interested in the [employer] area. FL2 sent to these facilities for review.  Spouse was providing needed transportation and manages her medications at home.  Await bed offers and pt will need approval through HTA. TOC following.   Expected Discharge Plan: Skilled Nursing Facility Barriers to Discharge: Continued Medical Work up   Patient Goals and CMS Choice   CMS Medicare.gov Compare Post Acute Care list provided to:: Patient Represenative (must comment) Choice offered to / list presented to : Spouse      Expected Discharge Plan and Services In-house Referral: Clinical Social Work   Post Acute Care Choice: Skilled Nursing Facility Living arrangements for the past 2 months: Single Family Home                                      Prior Living Arrangements/Services Living arrangements for the past 2 months: Single Family Home Lives with:: Spouse Patient language and need for interpreter reviewed:: Yes Do you feel safe going back to the place where you live?: Yes        Care giver support system in place?: Yes (comment) Current home services: DME (cane/ walker) Criminal Activity/Legal Involvement Pertinent to Current Situation/Hospitalization: No - Comment as needed  Activities of Daily Living   ADL Screening (condition at time of admission) Independently performs ADLs?: Yes (appropriate for developmental age) Is the patient deaf or have difficulty hearing?: No Does the patient have difficulty seeing, even when wearing  glasses/contacts?: Yes Does the patient have difficulty concentrating, remembering, or making decisions?: Yes  Permission Sought/Granted                  Emotional Assessment Appearance:: Appears stated age     Orientation: : Oriented to Self   Psych Involvement: No (comment)  Admission diagnosis:  Seizure (HCC) [R56.9] Subdural hematoma (HCC) [S06.5XAA] Patient Active Problem List   Diagnosis Date Noted   Seizure (HCC) 01/22/2023   Subdural hematoma (HCC) 01/22/2023   Tremor 11/23/2022   Urinary urgency 03/26/2022   Sore throat 03/07/2022   Seizure-like activity (HCC) 02/08/2022   Seborrheic keratosis 09/20/2021   Closed fracture of nasal bones 07/26/2021   Falls frequently 07/26/2021   Head injury 07/07/2021   Left hip pain 07/07/2021   Lipoma 07/07/2021   Slurred speech 06/07/2021   Hematoma 01/23/2021   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   Aortic atherosclerosis (HCC) 08/24/2020   Dementia (HCC) 03/14/2020   Left shoulder pain 07/22/2019   Hypothyroidism 02/16/2019   Goals of care, counseling/discussion 08/06/2018   Small cell lung cancer, right upper lobe (HCC) 08/06/2018   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   Skin tear of forearm without complication, initial encounter 12/06/2017   Bruising 12/06/2017   Overweight 08/05/2017   Anemia 01/23/2017   Cirrhosis of liver (HCC) 01/23/2017   Gastritis 01/23/2017   Hypokalemia  01/03/2017   Heart murmur 07/27/2016   Low back pain 08/15/2015   Hypertension 02/22/2015   HLD (hyperlipidemia) 02/22/2015   Depression 02/22/2015   PCP:  Glori Luis, MD Pharmacy:   [facility] [address], [name] - [address] [name] [address] Phone: [phone] Fax: [phone]  [facility] [address] [name] [address] Phone: [phone] Fax: [phone]     Social Drivers of Health (SDOH) Social History: SDOH  Screenings   Food Insecurity: Patient Unable To Answer (01/22/2023)  Housing: Patient Unable To Answer (01/22/2023)  Transportation Needs: Patient Unable To Answer (01/22/2023)  Utilities: Patient Unable To Answer (01/22/2023)  Depression (PHQ2-9): High Risk (06/21/2022)  Financial Resource Strain: Low Risk  (09/08/2020)  Physical Activity: Unknown (10/29/2018)  Social Connections: Unknown (09/08/2020)  Stress: No Stress Concern Present (09/08/2020)  Tobacco Use: Medium Risk (01/22/2023)   SDOH Interventions:     Readmission Risk Interventions     No data to display

## 2023-01-24 NOTE — Progress Notes (Signed)
LTM EEG discontinued - no skin breakdown at unhook.   

## 2023-01-24 NOTE — Progress Notes (Addendum)
Subjective: No seizures overnight.  Smiling, denies any concerns this morning.  ROS: negative except above  Examination  Vital signs in last 24 hours: Temp:  [97.5 F (36.4 C)-98.5 F (36.9 C)] 97.6 F (36.4 C) (12/19 0355) Pulse Rate:  [57-97] 59 (12/19 0550) Resp:  [13-31] 18 (12/19 0355) BP: (126-172)/(69-146) 164/71 (12/19 0550) SpO2:  [2 %-100 %] 100 % (12/19 0355) Weight:  [73.9 kg] 73.9 kg (12/18 1417)  General: lying in bed, NAD Eyes: Has a small laceration on left eyebrow secondary to recent fall per husband, also blackish discoloration around left eye Neuro: Opens eyes to verbal stimulation, able to tell me her name, knows she is at a hospital, knows it is December, follows simple one-step commands, able to name objects, PERRLA, EOMI, 4/5 in all 4 extremities   Basic Metabolic Panel: Recent Labs  Lab 01/22/23 0917 01/23/23 0529  NA 139 138  K 3.3* 3.7  CL 103 100  CO2 28 28  GLUCOSE 90 87  BUN 21 20  CREATININE 0.99 1.01*  CALCIUM 8.8* 9.2    CBC: Recent Labs  Lab 01/22/23 0917 01/23/23 0529  WBC 4.2 5.8  NEUTROABS 3.0  --   HGB 12.6 13.3  HCT 38.2 40.3  MCV 91.6 91.8  PLT 123* 148*     Coagulation Studies: Recent Labs    01/22/23 0917  LABPROT 15.2  INR 1.2    Imaging No  new brain imaging   ASSESSMENT AND PLAN: 72 year old female with PMH significant for dementia, HTN, HLD, Metastatic bone cancer, lung cancer, depression who was BIB EMS due to seizures early this morning. S/p fall Saturday AM, diagnosed with SDH at [facility], discharged on Keppra. Today's CT shows increased left SDH and stable R SDH. Spot EEG showed seizures lasting about 4 minutes.   New onset seizures in the setting of underlying SDH -No seizures overnight. Clinically also improving.  - DC Ltm  -Continue Keppra 1000 mg twice daily.  Okay to transition to p.o. - Rescue medication: Intranasal Nayzilam 5 mg for seizure lasting more than 2 minutes.  If seizures persist, can  repeat dose after 10 minutes.  Do not use more than 2 doses.  Copay for Netta Corrigan is $100.  If that is not affordable, can prescribe clonazepam 1 mg oral dissolving tablet instead -Continue seizure precautions -PT/OT -As needed IV Versed for clinical seizures -Discussed plan with medicine team via secure chat - f/u with neuro Dr Teresa Coombs in 3 months  -Neurology will sign off.  Please call us for any further questions.  Seizure precautions: Per [facility] statutes, patients with seizures are not allowed to drive until they have been seizure-free for six months and cleared by a physician    Use caution when using heavy equipment or power tools. Avoid working on ladders or at heights. Take showers instead of baths. Ensure the water temperature is not too high on the home water heater. Do not go swimming alone. Do not lock yourself in a room alone (i.e. bathroom). When caring for infants or small children, sit down when holding, feeding, or changing them to minimize risk of injury to the child in the event you have a seizure. Maintain good sleep hygiene. Avoid alcohol.    If patient has another seizure, call 911 and bring them back to the ED if: A.  The seizure lasts longer than 5 minutes.      B.  The patient doesn't wake shortly after the seizure or has new  problems such as difficulty seeing, speaking or moving following the seizure C.  The patient was injured during the seizure D.  The patient has a temperature over 102 F (39C) E.  The patient vomited during the seizure and now is having trouble breathing    During the Seizure   - First, ensure adequate ventilation and place patients on the floor on their left side  Loosen clothing around the neck and ensure the airway is patent. If the patient is clenching the teeth, do not force the mouth open with any object as this can cause severe damage - Remove all items from the surrounding that can be hazardous. The patient may be oblivious to  what's happening and may not even know what he or she is doing. If the patient is confused and wandering, either gently guide him/her away and block access to outside areas - Reassure the individual and be comforting - Call 911. In most cases, the seizure ends before EMS arrives. However, there are cases when seizures may last over 3 to 5 minutes. Or the individual may have developed breathing difficulties or severe injuries. If a pregnant patient or a person with diabetes develops a seizure, it is prudent to call an ambulance.     After the Seizure (Postictal Stage)   After a seizure, most patients experience confusion, fatigue, muscle pain and/or a headache. Thus, one should permit the individual to sleep. For the next few days, reassurance is essential. Being calm and helping reorient the person is also of importance.   Most seizures are painless and end spontaneously. Seizures are not harmful to others but can lead to complications such as stress on the lungs, brain and the heart. Individuals with prior lung problems may develop labored breathing and respiratory distress.     I have spent a total of  36  minutes with the patient reviewing hospital notes,  test results, labs and examining the patient as well as establishing an assessment and plan.  > 50% of time was spent in direct patient care.    Lindie Spruce Epilepsy [facility] For questions after 5pm please refer to AMION to reach the Neurologist on call

## 2023-01-24 NOTE — Evaluation (Signed)
Occupational Therapy Evaluation Patient Details Name: Anne [name] MRN: [id number] DOB: 10-17-1950 Today's Date: 01/24/2023   History of Present Illness Patient is a 72 year old female with new onset of seizures in the setting of underlying SDH. Recent diagnosis of subdural hematoma with CT reporting increase in her previous subdural hematoma with left shift. History of dementia, lung cancer, seizures, hypothyroid.   Clinical Impression   PTA, pt was living with her husband who assisted her with ADLs; information from daughter Anne [name] via phone. Pt currently requiring Max A for ADLs and Mod A +2 for functional mobility. Pt demonstrating decreased balance, cognition, coordination, awareness, and activity tolerance. Pt restless and stating "Lets go to the car. Its time to get out of here".  Pt agreeable throughout despite confusion. Pt would benefit from further acute OT to facilitate safe dc. Pending family decision on available assistance at home, recommend dc to post-acute rehab for further OT to optimize safety, independence with ADLs, and return to PLOF.       If plan is discharge home, recommend the following: A lot of help with walking and/or transfers;A lot of help with bathing/dressing/bathroom    Functional Status Assessment  Patient has had a recent decline in their functional status and demonstrates the ability to make significant improvements in function in a reasonable and predictable amount of time.  Equipment Recommendations  BSC/3in1    Recommendations for Other Services       Precautions / Restrictions Precautions Precautions: Fall Precaution Comments: Systolic blood pressure goal below 150 Restrictions Weight Bearing Restrictions Per Provider Order: No      Mobility Bed Mobility Overal bed mobility: Needs Assistance Bed Mobility: Supine to Sit, Sit to Supine     Supine to sit: Min assist Sit to supine: Min assist   General bed mobility comments: cues for  sequencing and task initiation    Transfers Overall transfer level: Needs assistance Equipment used: 2 person hand held assist Transfers: Sit to/from Stand Sit to Stand: Min assist           General transfer comment: steadying assistance required with standing      Balance Overall balance assessment: Needs assistance, History of Falls Sitting-balance support: Feet supported Sitting balance-Leahy Scale: Poor Sitting balance - Comments: posterior and right side lean with sitting. cues for midline Postural control: Right lateral lean, Posterior lean   Standing balance-Leahy Scale: Poor Standing balance comment: external support required with more pronounced right side lean with dynamic activity                           ADL either performed or assessed with clinical judgement   ADL Overall ADL's : Needs assistance/impaired Eating/Feeding: Minimal assistance;Bed level Eating/Feeding Details (indicate cue type and reason): assistance for safety and cognition Grooming: Minimal assistance;Bed level;Sitting   Upper Body Bathing: Maximal assistance;Sitting   Lower Body Bathing: Maximal assistance;Sit to/from stand   Upper Body Dressing : Maximal assistance;Sitting;Bed level   Lower Body Dressing: Maximal assistance;Bed level;Sit to/from stand   Toilet Transfer: Minimal assistance;+2 for physical assistance;Ambulation;Moderate assistance (simulated in room)             General ADL Comments: Pt functional performance limited by decreased cognition. Pt with poor attention and awareness. Performing functional mobility with Min-Mod A +2     Vision         Perception         Praxis  Pertinent Vitals/Pain Pain Assessment Pain Assessment: Faces Faces Pain Scale: No hurt Pain Intervention(s): Monitored during session     Extremity/Trunk Assessment Upper Extremity Assessment Upper Extremity Assessment: Generalized weakness;Difficult to assess due  to impaired cognition   Lower Extremity Assessment Lower Extremity Assessment: Defer to PT evaluation RLE Deficits / Details: patient has right side lean with standing and difficulty advancing the right leg with ambulation. unable to follow commands for formal MMT   Cervical / Trunk Assessment Cervical / Trunk Assessment: Kyphotic   Communication Communication Communication: Difficulty following commands/understanding Following commands: Follows one step commands inconsistently Cueing Techniques: Verbal cues;Tactile cues;Gestural cues;Visual cues   Cognition Arousal: Alert Behavior During Therapy: Impulsive Overall Cognitive Status: Impaired/Different from baseline                                 General Comments: history of cognitive impairment at baseline. patient is oriented to self only today. she needs increased time and multi modal cues to follow single step commands. she thinks her family is in the room but no family present, asking to go home     General Comments  SpO2 92% on RA    Exercises     Shoulder Instructions      Home Living Family/patient expects to be discharged to:: Private residence Living Arrangements: Spouse/significant other Available Help at Discharge: Family;Available 24 hours/day Type of Home: House                       Home Equipment: Gilmer Mor - single point   Additional Comments: patient is a poor historian and no family in the room. spoke with daugther Anne [name] via phone who provided information      Prior Functioning/Environment Prior Level of Function : History of Falls (last six months)             Mobility Comments: family holds her arm for ambulation, is encouraged to use the cane but typically is not agreeable. "shaky" recently. she does get up to the bathroom in the night without alerting the husband ADLs Comments: spouse assists with dressing and bathing        OT Problem List: Decreased strength;Decreased  range of motion;Decreased activity tolerance;Impaired balance (sitting and/or standing);Decreased cognition;Decreased safety awareness;Decreased knowledge of precautions      OT Treatment/Interventions: Self-care/ADL training;Therapeutic exercise;Energy conservation;DME and/or AE instruction;Cognitive remediation/compensation;Therapeutic activities;Patient/family education;Balance training    OT Goals(Current goals can be found in the care plan section) Acute Rehab OT Goals Patient Stated Goal: "Let go get in the car" OT Goal Formulation: Patient unable to participate in goal setting Time For Goal Achievement: 02/07/23 Potential to Achieve Goals: Good  OT Frequency: Min 1X/week    Co-evaluation PT/OT/SLP Co-Evaluation/Treatment: Yes Reason for Co-Treatment: Necessary to address cognition/behavior during functional activity;To address functional/ADL transfers;For patient/therapist safety PT goals addressed during session: Mobility/safety with mobility OT goals addressed during session: ADL's and self-care      AM-PAC OT "6 Clicks" Daily Activity     Outcome Measure Help from another person eating meals?: A Little Help from another person taking care of personal grooming?: A Little Help from another person toileting, which includes using toliet, bedpan, or urinal?: A Lot Help from another person bathing (including washing, rinsing, drying)?: A Lot Help from another person to put on and taking off regular upper body clothing?: A Lot Help from another person to put on and taking off regular  lower body clothing?: A Lot 6 Click Score: 14   End of Session Equipment Utilized During Treatment: Gait belt Nurse Communication: Mobility status  Activity Tolerance: Patient tolerated treatment well Patient left: in bed;with call bell/phone within reach;with bed alarm set;with nursing/sitter in room;with restraints reapplied  OT Visit Diagnosis: Unsteadiness on feet (R26.81);Other abnormalities of  gait and mobility (R26.89);Muscle weakness (generalized) (M62.81)                Time: 2841-3244 OT Time Calculation (min): 24 min Charges:  OT General Charges $OT Visit: 1 Visit OT Evaluation $OT Eval Moderate Complexity: 1 Mod  Darya Bigler MSOT, OTR/L Acute Rehab Office: [phone]  Theodoro Grist Demarcus Thielke 01/24/2023, 12:10 PM

## 2023-01-24 NOTE — Progress Notes (Signed)
HD#2 SUBJECTIVE:  Patient Summary: Anne [name] is a 72 y.o. with a pertinent PMH of recently diagnosed subdural hematoma, seizure disorder, progressive dementia, hypertension, and hypothyroidism who presented after a seizure. In the ED patient had another tonic clonic seizure and CT showed increase in her previous subdural hematoma with left shift. Patient was admitted for seizure-like activity in the setting of evolving subdural hematoma.   Overnight Events: BP still consistently over goal systolic <150, despite addition of oral losartan, resuming home amlodipine, and continuation of labetalol PRN. Patient received 10 mg IV hydralazine at 0400 without significant improvement in BP.  Interim History: Patient was seen resting comfortably in bed. Patient responded to voice and was more alert than yesterday. Patient seemed to be attempting to speak more today, but difficult to understand, likely due to poor dentition. Patient was able to respond to simple yes or no questions. Patient reported pain on the right side of her abdomen. Patient was not oriented to place this morning. Patient became more somnolent towards the end of the interview, but was able to follow simple instructions.   OBJECTIVE:  Vital Signs: Vitals:   01/24/23 0355 01/24/23 0446 01/24/23 0550 01/24/23 0816  BP: (!) 172/83 (!) 172/83 (!) 164/71 (!) 169/66  Pulse: 62  (!) 59 63  Resp: 18   16  Temp: 97.6 F (36.4 C)   98 F (36.7 C)  TempSrc: Oral   Oral  SpO2: 100%   98%  Weight:      Height:       Supplemental O2: Nasal Cannula SpO2: 98 % O2 Flow Rate (L/min): 2 L/min  Filed Weights   01/23/23 1417  Weight: 73.9 kg     Intake/Output Summary (Last 24 hours) at 01/24/2023 0941 Last data filed at 01/23/2023 2017 Gross per 24 hour  Intake --  Output 700 ml  Net -700 ml   Net IO Since Admission: -420 mL [01/24/23 0941]  Physical Exam: Physical Exam Vitals and nursing note reviewed.  Constitutional:       General: She is not in acute distress.    Appearance: She is not diaphoretic.  HENT:     Head:     Comments: Large contusion around the left orbit with a 2 cm laceration at the left eyebrow.  Eyes:     Conjunctiva/sclera: Conjunctivae normal.     Pupils: Pupils are equal, round, and reactive to light.  Cardiovascular:     Rate and Rhythm: Normal rate and regular rhythm.     Pulses:          Radial pulses are 2+ on the right side and 2+ on the left side.     Heart sounds: Murmur heard.     Systolic murmur is present with a grade of 2/6.     No friction rub. No gallop.  Abdominal:     General: Abdomen is flat. Bowel sounds are normal. There is no distension.     Palpations: Abdomen is soft. There is no mass.     Tenderness: There is no abdominal tenderness. There is no guarding.  Musculoskeletal:     Right lower leg: No edema.     Left lower leg: No edema.     Comments: Able to move all extremities.   Neurological:     Comments: Responds to voice. Oriented only to self.  Able to squeeze both hands. Can move toes bilaterally. Patient seemed to be attempting to speak, difficult to understand due to poor  dentition and able to answer some yes/no questions.     Patient Lines/Drains/Airways Status     Active Line/Drains/Airways     Name Placement date Placement time Site Days   Peripheral IV 01/22/23 18 G Left Antecubital 01/22/23  0705  Antecubital   1   Wound / Incision (Open or Dehisced) 10/29/18 Other (Comment) Leg Left;Posterior;Upper Boil 10/29/18  0058  Leg  1547             ASSESSMENT/PLAN:  Assessment: Principal Problem:   Seizure (HCC) Active Problems:   Hypertension   Subdural hematoma (HCC)   Plan: [name] is a 72 y.o. female with pertinent PMH of recently diagnosed subdural hematoma, progressive dementia who presented to [facility] ED on the morning of 01/22/2023 after a seizure and is admitted for Subdural hematoma with subsequent seizure and somnolence.    Subdural Hematoma secondary to fall from standing with Seizures and Somnolence This is a patient who experienced a fall from standing at home on Saturday morning, 12/14 and she struck the left side of her face on a piece of furniture.  At that time ED workup revealed a subdural hematoma that was stable and not requiring hospitalization but for which she was started on prophylactic Keppra 500 twice daily in part due to previous hematoma with seizures in October and spontaneous seizures in January.  Now she has recurrent seizures despite taking Keppra, and evidence of worsening of her subdural hematoma upon CT. At admission she was newly somnolent as well, as she was not speaking, kept her eyes closed, and only responds to the command to squeeze both hands. At that time she was not felt to require surgery, but neurology remains involved and ordered overnight EEG as she has demonstrated further seizure activity since arriving in the ER. Somnolence improved today and patient responds to voice. Patient is attempting to form words and able to respond to some yes or no questions. She was only oriented to self and could move all extremities when asked to. Repeat CT showed no change in size of her subdural hematoma from previous CT. EEG re-attempted overnight with patient in mittens to help prevent removal of EEG leads. No witnessed seizures yesterday or overnight. Wonder if increase in seizure activity recently may be precipitated be her subdural hematoma?  - Continuous EEG completed this morning, results pending. - Systolic blood pressure goal below 150, started on scheduled oral amlodipine and irbesartan, with IV hydralazine PRN.   - PT/OT eval today - Continue to monitor neurologic status - Hold home Seroquel 25mg  per NPO due to ongoing seizures, hold home Depakote 125mg  to avoid reduction of platelet levels in setting of progressive bleed - Keppra 1000 mg IV Q12H, transition to po pending improvement in patient  functional status  - Neurology involved, thank you   Dementia Progressive worsening over this past year.  Functional decline began before her first fall and subdural hematoma in October.  Present baseline is disorientation to time, frequent redirection, occasional irritability, but able to converse. She is not able to perform her own ADLs including grooming and cooking and lately her husband is helping her eat. Patient functional status acutely declined from baseline but improving. Patient now able to tolerate po medications when crushed.  - hold home memantine 10mg  daily, Rivastigmine 3mg    #Uncontrolled HTN Goal SBP is below 150 per evolving subdural hematoma. Goal not reached despite multiple doses of IV labetalol. Avoiding more frequent or increased dose of labetalol due to  bradycardia, and avoiding nitroprusside due to increased ICP. Systolic BP over last 24 hours closer to range, 160s-170s, than yesterday. BP in range for short period after administration of 5mg  hydralazine yesterday morning, but BP did not decrease to goal after 10 mg hydralazine administered overnight. Able to resume po medications as long as they are crushed. Increased home amlodipine to 10 mg and started patient on ARB given onset time of amlodipine. Discontinued labetalol given minimal impact on patient BP and patient's low normal HR. Will continue PRN hydralazine, but decreased dose to 2 mg from 10 mg to avoid lowering patient's BP too rapidly.  - Home amlodipine increased to 10 mg daily - Losartan 25 mg transitioned to irbesartan 150 mg, appreciate pharmacy recommendation - Hydralazine 2mg  Q4H PRN - Continue to trend BP    Depression/Anxiety Hold home sertraline 150mg  daily, hold home Wellbutrin 300mg  in setting of seizures   Hypothyroidism Hold synthroid daily   HLD Hold rosuvastatin 20mg  daily   Best Practice: Diet: NPO IVF: Fluids: None, Rate: None VTE: SCDs Start: 01/22/23 1400 Code: Full AB:  None Therapy Recs:  N/a , DME: none  Family Contact: Husband, to be notified. DISPO: Anticipated discharge in greater than 2 days to Skilled nursing facility pending  seizure activity work up and medical stability .  Signature: Ezekiel Slocumb  Medical Student, MS3 Pager: [phone] 9:41 AM, 01/24/2023

## 2023-01-24 NOTE — Telephone Encounter (Signed)
Pharmacy Patient Advocate Encounter  Insurance verification completed.    The patient is insured through [employer]/ [employer].     Ran test claim for Nayzilam and the current 30 day co-pay is $100.00.   This test claim was processed through [facility]- copay amounts may vary at other pharmacies due to pharmacy/plan contracts, or as the patient moves through the different stages of their insurance plan.

## 2023-01-24 NOTE — NC FL2 (Signed)
[place] MEDICAID FL2 LEVEL OF CARE FORM     IDENTIFICATION  Patient Name: Anne [name] Birthdate: 1950/05/08 Sex: female Admission Date (Current Location): 01/22/2023  [facility] and [place] Number:  Producer, television/film/video and Address:  [facility] [place], [address], [place], [name] [address]      Provider Number: [id number]  Attending Physician Name and Address:  Ginnie Smart, MD  Relative Name and Phone Number:  Cyrine, Glandon [phone]  [phone]    Current Level of Care: Hospital Recommended Level of Care: Skilled Nursing Facility Prior Approval Number:    Date Approved/Denied:   PASRR Number: [id number]  Discharge Plan: SNF    Current Diagnoses: Patient Active Problem List   Diagnosis Date Noted   Seizure (HCC) 01/22/2023   Subdural hematoma (HCC) 01/22/2023   Tremor 11/23/2022   Urinary urgency 03/26/2022   Sore throat 03/07/2022   Seizure-like activity (HCC) 02/08/2022   Seborrheic keratosis 09/20/2021   Closed fracture of nasal bones 07/26/2021   Falls frequently 07/26/2021   Head injury 07/07/2021   Left hip pain 07/07/2021   Lipoma 07/07/2021   Slurred speech 06/07/2021   Hematoma 01/23/2021   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   Aortic atherosclerosis (HCC) 08/24/2020   Dementia (HCC) 03/14/2020   Left shoulder pain 07/22/2019   Hypothyroidism 02/16/2019   Goals of care, counseling/discussion 08/06/2018   Small cell lung cancer, right upper lobe (HCC) 08/06/2018   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   Skin tear of forearm without complication, initial encounter 12/06/2017   Bruising 12/06/2017   Overweight 08/05/2017   Anemia 01/23/2017   Cirrhosis of liver (HCC) 01/23/2017   Gastritis 01/23/2017   Hypokalemia 01/03/2017   Heart murmur 07/27/2016   Low back pain 08/15/2015   Hypertension 02/22/2015   HLD (hyperlipidemia) 02/22/2015   Depression 02/22/2015    Orientation  RESPIRATION BLADDER Height & Weight     Self  Normal, O2 (see dc summary) Incontinent Weight: 73.9 kg Height:  5\' 8"  (172.7 cm)  BEHAVIORAL SYMPTOMS/MOOD NEUROLOGICAL BOWEL NUTRITION STATUS    Convulsions/Seizures Continent Diet (dyshagia 1 with thin liquids)  AMBULATORY STATUS COMMUNICATION OF NEEDS Skin   Extensive Assist Verbally Bruising, Skin abrasions (abrasions to knees/ bruising to knees/ arms/ face)                       Personal Care Assistance Level of Assistance  Bathing, Feeding, Dressing Bathing Assistance: Maximum assistance Feeding assistance: Maximum assistance Dressing Assistance: Maximum assistance     Functional Limitations Info  Sight, Hearing, Speech Sight Info: Impaired Hearing Info: Adequate Speech Info: Impaired    SPECIAL CARE FACTORS FREQUENCY  PT (By licensed PT), OT (By licensed OT), Speech therapy     PT Frequency: 5x/wk OT Frequency: 5x/wk     Speech Therapy Frequency: 5x/wk      Contractures Contractures Info: Not present    Additional Factors Info  Code Status, Allergies, Psychotropic Code Status Info: Full Allergies Info: Bee Venom Psychotropic Info: Keppra 1000mg  BiD         Current Medications (01/24/2023):  This is the current hospital active medication list Current Facility-Administered Medications  Medication Dose Route Frequency Provider Last Rate Last Admin   amLODipine (NORVASC) tablet 10 mg  10 mg Oral Daily Nooruddin, Saad, MD   10 mg at 01/24/23 0945   hydrALAZINE (APRESOLINE) injection 2 mg  2 mg Intravenous Q4H PRN Nooruddin, Jason Fila, MD  irbesartan (AVAPRO) tablet 150 mg  150 mg Oral Daily Katheran James, DO   150 mg at 01/24/23 0945   levETIRAcetam (KEPPRA) tablet 1,000 mg  1,000 mg Oral BID Nooruddin, Saad, MD       midazolam (VERSED) injection 2 mg  2 mg Intravenous Q4H PRN Charlsie Quest, MD       Facility-Administered Medications Ordered in Other Encounters  Medication Dose Route Frequency  Provider Last Rate Last Admin   denosumab (XGEVA) injection 120 mg  120 mg Subcutaneous Q30 days Creig Hines, MD   120 mg at 03/05/19 1525   heparin lock flush 100 UNIT/ML injection              Discharge Medications: Please see discharge summary for a list of discharge medications.  Relevant Imaging Results:  Relevant Lab Results:   Additional Information SS#: [id number]  Kermit Balo, RN

## 2023-01-24 NOTE — Telephone Encounter (Signed)
Pharmacy Patient Advocate Encounter  Insurance verification completed.    The patient is insured through [employer]/ [employer]. Patient has Medicare and is not eligible for a copay card, but may be able to apply for patient assistance, if available.    Ran test claim for Valtoco 15 mg dose and the current 30 day co-pay is $220.62.   This test claim was processed through [facility]- copay amounts may vary at other pharmacies due to pharmacy/plan contracts, or as the patient moves through the different stages of their insurance plan.

## 2023-01-24 NOTE — Plan of Care (Signed)
  Problem: Education: Goal: Knowledge of General Education information will improve Description: Including pain rating scale, medication(s)/side effects and non-pharmacologic comfort measures 01/24/2023 1515 by Elba Barman, RN Outcome: Progressing 01/24/2023 1515 by Elba Barman, RN Outcome: Progressing   Problem: Health Behavior/Discharge Planning: Goal: Ability to manage health-related needs will improve 01/24/2023 1515 by Elba Barman, RN Outcome: Progressing 01/24/2023 1515 by Elba Barman, RN Outcome: Progressing   Problem: Clinical Measurements: Goal: Ability to maintain clinical measurements within normal limits will improve 01/24/2023 1515 by Elba Barman, RN Outcome: Progressing 01/24/2023 1515 by Elba Barman, RN Outcome: Progressing Goal: Will remain free from infection 01/24/2023 1515 by Elba Barman, RN Outcome: Progressing 01/24/2023 1515 by Elba Barman, RN Outcome: Progressing Goal: Diagnostic test results will improve 01/24/2023 1515 by Elba Barman, RN Outcome: Progressing 01/24/2023 1515 by Elba Barman, RN Outcome: Progressing Goal: Respiratory complications will improve 01/24/2023 1515 by Elba Barman, RN Outcome: Progressing 01/24/2023 1515 by Elba Barman, RN Outcome: Progressing Goal: Cardiovascular complication will be avoided 01/24/2023 1515 by Elba Barman, RN Outcome: Progressing 01/24/2023 1515 by Elba Barman, RN Outcome: Progressing   Problem: Activity: Goal: Risk for activity intolerance will decrease 01/24/2023 1515 by Elba Barman, RN Outcome: Progressing 01/24/2023 1515 by Elba Barman, RN Outcome: Progressing   Problem: Nutrition: Goal: Adequate nutrition will be maintained 01/24/2023 1515 by Elba Barman, RN Outcome: Progressing 01/24/2023 1515 by Elba Barman, RN Outcome: Progressing   Problem: Coping: Goal: Level of anxiety will decrease 01/24/2023 1515 by Elba Barman, RN Outcome:  Progressing 01/24/2023 1515 by Elba Barman, RN Outcome: Progressing   Problem: Elimination: Goal: Will not experience complications related to bowel motility 01/24/2023 1515 by Elba Barman, RN Outcome: Progressing 01/24/2023 1515 by Elba Barman, RN Outcome: Progressing Goal: Will not experience complications related to urinary retention 01/24/2023 1515 by Elba Barman, RN Outcome: Progressing 01/24/2023 1515 by Elba Barman, RN Outcome: Progressing   Problem: Pain Management: Goal: General experience of comfort will improve 01/24/2023 1515 by Elba Barman, RN Outcome: Progressing 01/24/2023 1515 by Elba Barman, RN Outcome: Progressing   Problem: Safety: Goal: Ability to remain free from injury will improve 01/24/2023 1515 by Elba Barman, RN Outcome: Progressing 01/24/2023 1515 by Elba Barman, RN Outcome: Progressing   Problem: Skin Integrity: Goal: Risk for impaired skin integrity will decrease 01/24/2023 1515 by Elba Barman, RN Outcome: Progressing 01/24/2023 1515 by Elba Barman, RN Outcome: Progressing

## 2023-01-24 NOTE — Evaluation (Signed)
Speech Language Pathology Evaluation Patient Details Name: Anne [name] MRN: [id number] DOB: Sep 07, 1950 Today's Date: 01/24/2023 Time: 0102-7253 SLP Time Calculation (min) (ACUTE ONLY): 14 min  Problem List:  Patient Active Problem List   Diagnosis Date Noted   Seizure (HCC) 01/22/2023   Subdural hematoma (HCC) 01/22/2023   Tremor 11/23/2022   Urinary urgency 03/26/2022   Sore throat 03/07/2022   Seizure-like activity (HCC) 02/08/2022   Seborrheic keratosis 09/20/2021   Closed fracture of nasal bones 07/26/2021   Falls frequently 07/26/2021   Head injury 07/07/2021   Left hip pain 07/07/2021   Lipoma 07/07/2021   Slurred speech 06/07/2021   Hematoma 01/23/2021   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   Aortic atherosclerosis (HCC) 08/24/2020   Dementia (HCC) 03/14/2020   Left shoulder pain 07/22/2019   Hypothyroidism 02/16/2019   Goals of care, counseling/discussion 08/06/2018   Small cell lung cancer, right upper lobe (HCC) 08/06/2018   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   Skin tear of forearm without complication, initial encounter 12/06/2017   Bruising 12/06/2017   Overweight 08/05/2017   Anemia 01/23/2017   Cirrhosis of liver (HCC) 01/23/2017   Gastritis 01/23/2017   Hypokalemia 01/03/2017   Heart murmur 07/27/2016   Low back pain 08/15/2015   Hypertension 02/22/2015   HLD (hyperlipidemia) 02/22/2015   Depression 02/22/2015   Past Medical History:  Past Medical History:  Diagnosis Date   Arthritis    Cirrhosis (HCC)    Dementia (HCC)    Depression    GERD (gastroesophageal reflux disease)    Hyperlipidemia    Hypertension    Lung cancer (HCC)    Metastatic bone cancer    Past Surgical History:  Past Surgical History:  Procedure Laterality Date   APPENDECTOMY  1971   CHOLECYSTECTOMY  1971   COLONOSCOPY WITH PROPOFOL N/A 11/15/2016   Procedure: COLONOSCOPY WITH PROPOFOL;  Surgeon: Wyline Mood, MD;  Location: [facility] ENDOSCOPY;   Service: Gastroenterology;  Laterality: N/A;   ENDOBRONCHIAL ULTRASOUND Right 07/30/2018   Procedure: ENDOBRONCHIAL ULTRASOUND;  Surgeon: Shane Crutch, MD;  Location: [facility] ORS;  Service: Pulmonary;  Laterality: Right;   ESOPHAGOGASTRODUODENOSCOPY (EGD) WITH PROPOFOL N/A 01/07/2017   Procedure: ESOPHAGOGASTRODUODENOSCOPY (EGD) WITH PROPOFOL;  Surgeon: Wyline Mood, MD;  Location: [facility] ENDOSCOPY;  Service: Gastroenterology;  Laterality: N/A;   LAPAROSCOPY N/A 03/01/2017   Procedure: LAPAROSCOPY DIAGNOSTIC;  Surgeon: Earline Mayotte, MD;  Location: [facility] ORS;  Service: General;  Laterality: N/A;   PORTA CATH INSERTION N/A 08/14/2018   Procedure: PORTA CATH INSERTION;  Surgeon: Annice Needy, MD;  Location: [facility] INVASIVE CV LAB;  Service: Cardiovascular;  Laterality: N/A;   TONSILECTOMY, ADENOIDECTOMY, BILATERAL MYRINGOTOMY AND TUBES  1955   TONSILLECTOMY     VENTRAL HERNIA REPAIR N/A 03/01/2017   10 x 14 CM Ventralight ST mesh, intraperitoneal location.    VENTRAL HERNIA REPAIR N/A 03/01/2017   Procedure: HERNIA REPAIR VENTRAL ADULT;  Surgeon: Earline Mayotte, MD;  Location: [facility] ORS;  Service: General;  Laterality: N/A;   HPI:  Anne [name] is a 72 y.o. with a pertinent PMH of recently diagnosed subdural hematoma, seizure disorder, progressive dementia, hypertension, and hypothyroidism who presented after a seizure. In the ED patient had another tonic clonic seizure and CT showed increase in her previous subdural hematoma with left shift. Patient was admitted for seizure-like activity in the setting of evolving subdural hematoma.   Assessment / Plan / Recommendation Clinical Impression  Pt presents with significant cognitive impairment. Husband at bedside is  very hard of hearing but is able to report recent cognitive decline, poor safey awareness. Her presentation today is restless and at times agitated. Her function may be a result of recurrent SDH and she could be given a Rancho scale of  IV (confused, agitated and inappropriate) given that she is pulling at covers moving purposelessly around her bed, pushing and pulling at people and things around her, unalbe to complete basic purposeful tasks like self feeding. Pt shouts at therapist and tries to hit and pinch. Husband reports this is different from her baseline. Recommend ongoing SLP intervention for cognition though ultimately pt will need SNF placement and full supervision.    SLP Assessment  SLP Recommendation/Assessment: Patient needs continued Speech Lanaguage Pathology Services SLP Visit Diagnosis: Cognitive communication deficit (R41.841)    Recommendations for follow up therapy are one component of a multi-disciplinary discharge planning process, led by the attending physician.  Recommendations may be updated based on patient status, additional functional criteria and insurance authorization.    Follow Up Recommendations  Skilled nursing-short term rehab (<3 hours/day)    Assistance Recommended at Discharge  Frequent or constant Supervision/Assistance  Functional Status Assessment    Frequency and Duration min 2x/week  2 weeks      SLP Evaluation Cognition  Overall Cognitive Status: Impaired/Different from baseline Arousal/Alertness: Awake/alert Orientation Level: Oriented to person Attention: Focused;Sustained Focused Attention: Appears intact Sustained Attention: Impaired Sustained Attention Impairment: Verbal basic;Functional basic Memory: Impaired Awareness: Impaired       Comprehension  Auditory Comprehension Overall Auditory Comprehension: Impaired Yes/No Questions: Within Functional Limits Commands: Impaired One Step Basic Commands: 0-24% accurate    Expression Verbal Expression Overall Verbal Expression: Impaired Initiation: No impairment Automatic Speech: Name;Social Response Interfering Components: Attention;Speech intelligibility   Oral / Motor  Oral Motor/Sensory Function Overall  Oral Motor/Sensory Function: Generalized oral weakness Motor Speech Overall Motor Speech: Impaired Respiration: Within functional limits Phonation: Normal Resonance: Hypernasality Articulation: Impaired Level of Impairment: Word            Naydeline Morace, [name] 01/24/2023, 1:41 PM

## 2023-01-24 NOTE — Progress Notes (Signed)
Speech Language Pathology Treatment: Dysphagia  Patient Details Name: Anne [name] MRN: [id number] DOB: 1950/05/18 Today's Date: 01/24/2023 Time: 4098-1191 SLP Time Calculation (min) (ACUTE ONLY): 14 min  Assessment / Plan / Recommendation Clinical Impression  Pt demonstrates significant cognitive impairment impacting attention to self feeding. Functional also impacted by mild agitation. Pt at times trying to hit or pinch when being presented with PO. If handed food pt does not sustain attention or have the motor control to feed herself. Easily distractible and restless. Pt took sips of water from a straw without coughing. Never accepted any chewable solids successfully. Ultimately pt does not appear ready for solids other than purees. Recommend pt continue dys 1/thin at this time. Will f/u while admitted.   HPI HPI: Anne [name] is a 72 y.o. with a pertinent PMH of recently diagnosed subdural hematoma, seizure disorder, progressive dementia, hypertension, and hypothyroidism who presented after a seizure. In the ED patient had another tonic clonic seizure and CT showed increase in her previous subdural hematoma with left shift. Patient was admitted for seizure-like activity in the setting of evolving subdural hematoma.      SLP Plan  Continue with current plan of care      Recommendations for follow up therapy are one component of a multi-disciplinary discharge planning process, led by the attending physician.  Recommendations may be updated based on patient status, additional functional criteria and insurance authorization.    Recommendations  Diet recommendations: Dysphagia 1 (puree);Thin liquid Liquids provided via: Straw Medication Administration: Whole meds with puree Supervision: Full supervision/cueing for compensatory strategies Compensations: Slow rate;Small sips/bites                  Oral care BID   Frequent or constant Supervision/Assistance       Continue with  current plan of care     Anne [name], Anne [name]  01/24/2023, 1:21 PM

## 2023-01-24 NOTE — Evaluation (Signed)
Physical Therapy Evaluation Patient Details Name: Anne [name] MRN: [id number] DOB: 1950/06/23 Today's Date: 01/24/2023  History of Present Illness  Patient is a 72 year old female with new onset of seizures in the setting of underlying SDH. Recent diagnosis of subdural hematoma with CT reporting increase in her previous subdural hematoma with left shift. History of dementia, lung cancer, seizures, hypothyroid.   Clinical Impression  Patient is confused but cooperative with multi modal cues. No family at the bedside. Daughter [name] reports patient is ambulatory, cane encouraged but patient will not use at baseline. She has had increased shakiness at home and family has been holding her arm for ambulation recently. She does get up to the bathroom at night sometimes without assistance. History of falls. She lives with her spouse.  Today, the patient required assistance for bed mobility, transfers, and ambulation. She has a right and posterior lean while sitting. Difficulty advancing the right leg initially with more pronounced right side lean with fatigue while ambulating. The patient is a high fall risk and will require physical assistance with mobility for safety and fall prevention. She does not appear to be at her baseline level of functional independence. Consider rehabilitation < 3 hours/day after this hospital stay. Patient would require 24/7 supervision for safe return home.       If plan is discharge home, recommend the following: A lot of help with walking and/or transfers;A lot of help with bathing/dressing/bathroom;Supervision due to cognitive status;Help with stairs or ramp for entrance;Assist for transportation;Direct supervision/assist for medications management;Direct supervision/assist for financial management;Assistance with cooking/housework   Can travel by private vehicle   No    Equipment Recommendations None recommended by PT  Recommendations for Other Services        Functional Status Assessment Patient has had a recent decline in their functional status and demonstrates the ability to make significant improvements in function in a reasonable and predictable amount of time.     Precautions / Restrictions Precautions Precautions: Fall Precaution Comments: Systolic blood pressure goal below 150 Restrictions Weight Bearing Restrictions Per Provider Order: No      Mobility  Bed Mobility Overal bed mobility: Needs Assistance Bed Mobility: Supine to Sit, Sit to Supine     Supine to sit: Min assist Sit to supine: Min assist   General bed mobility comments: cues for sequencing and task initiation    Transfers Overall transfer level: Needs assistance Equipment used: 2 person hand held assist Transfers: Sit to/from Stand Sit to Stand: Min assist           General transfer comment: steadying assistance required with standing    Ambulation/Gait Ambulation/Gait assistance: Mod assist Gait Distance (Feet): 120 Feet Assistive device: 1 person hand held assist, 2 person hand held assist Gait Pattern/deviations: Step-through pattern, Decreased stance time - right, Decreased stride length, Decreased step length - right Gait velocity: decreased     General Gait Details: patient has difficulty advancing RLE initially with maximal cues and faciliation provided. patient has right side lean with ambulation with Mod A- Min A required for safety. HHA +2 person initially for safety, progressing to one person assistance. maximal navigational cues required and several standing rest breaks needed.  Stairs            Wheelchair Mobility     Tilt Bed    Modified Rankin (Stroke Patients Only)       Balance Overall balance assessment: Needs assistance, History of Falls Sitting-balance support: Feet supported Sitting balance-Leahy  Scale: Poor Sitting balance - Comments: posterior and right side lean with sitting. cues for midline Postural  control: Right lateral lean, Posterior lean   Standing balance-Leahy Scale: Poor Standing balance comment: external support required with more pronounced right side lean with dynamic activity                             Pertinent Vitals/Pain Pain Assessment Pain Assessment: Faces Faces Pain Scale: No hurt    Home Living Family/patient expects to be discharged to:: Private residence Living Arrangements: Spouse/significant other Available Help at Discharge: Family;Available 24 hours/day Type of Home: House           Home Equipment: Gilmer Mor - single point Additional Comments: patient is a poor historian and no family in the room. spoke with daugther [name] via phone who provided information    Prior Function Prior Level of Function : History of Falls (last six months)             Mobility Comments: family holds her arm for ambulation, is encouraged to use the cane but typically is not agreeable. "shaky" recently. she does get up to the bathroom in the night without alerting the husband ADLs Comments: spouse assists with dressing     Extremity/Trunk Assessment   Upper Extremity Assessment Upper Extremity Assessment: Defer to OT evaluation    Lower Extremity Assessment Lower Extremity Assessment: Difficult to assess due to impaired cognition;RLE deficits/detail RLE Deficits / Details: patient has right side lean with standing and difficulty advancing the right leg with ambulation. unable to follow commands for formal MMT       Communication   Communication Communication: Difficulty following commands/understanding Following commands: Follows one step commands inconsistently Cueing Techniques: Verbal cues;Gestural cues;Tactile cues;Visual cues  Cognition Arousal: Alert Behavior During Therapy: Impulsive Overall Cognitive Status: Impaired/Different from baseline                                 General Comments: history of cognitive impairment  at baseline. patient is oriented to self only today. she needs increased time and multi modal cues to follow single step commands. she thinks her family is in the room but no family present, asking to go home        General Comments      Exercises     Assessment/Plan    PT Assessment Patient needs continued PT services  PT Problem List Decreased strength;Decreased range of motion;Decreased activity tolerance;Decreased balance;Decreased mobility;Decreased cognition;Decreased safety awareness;Decreased knowledge of use of DME       PT Treatment Interventions DME instruction;Gait training;Stair training;Functional mobility training;Therapeutic activities;Therapeutic exercise;Balance training;Neuromuscular re-education;Cognitive remediation;Patient/family education    PT Goals (Current goals can be found in the Care Plan section)  Acute Rehab PT Goals Patient Stated Goal: daughter prefers rehab if insurance will cover the cost PT Goal Formulation: With patient Time For Goal Achievement: 02/07/23 Potential to Achieve Goals: Fair    Frequency Min 1X/week     Co-evaluation PT/OT/SLP Co-Evaluation/Treatment: Yes Reason for Co-Treatment: Necessary to address cognition/behavior during functional activity;To address functional/ADL transfers;For patient/therapist safety PT goals addressed during session: Mobility/safety with mobility         AM-PAC PT "6 Clicks" Mobility  Outcome Measure Help needed turning from your back to your side while in a flat bed without using bedrails?: A Little Help needed moving from lying on your back to  sitting on the side of a flat bed without using bedrails?: A Little Help needed moving to and from a bed to a chair (including a wheelchair)?: A Lot Help needed standing up from a chair using your arms (e.g., wheelchair or bedside chair)?: A Lot Help needed to walk in hospital room?: A Lot Help needed climbing 3-5 steps with a railing? : Total 6 Click  Score: 13    End of Session Equipment Utilized During Treatment: Gait belt Activity Tolerance: Patient tolerated treatment well;Patient limited by fatigue Patient left: in bed;with call bell/phone within reach;with bed alarm set;with nursing/sitter in room (nurse tech in the room) Nurse Communication: Mobility status PT Visit Diagnosis: Other abnormalities of gait and mobility (R26.89);Difficulty in walking, not elsewhere classified (R26.2)    Time: 1308-6578 PT Time Calculation (min) (ACUTE ONLY): 23 min   Charges:   PT Evaluation $PT Eval Moderate Complexity: 1 Mod   PT General Charges $$ ACUTE PT VISIT: 1 Visit         Donna Bernard, PT, MPT   Ina Homes 01/24/2023, 11:48 AM

## 2023-01-25 ENCOUNTER — Other Ambulatory Visit (HOSPITAL_COMMUNITY): Payer: Self-pay

## 2023-01-25 DIAGNOSIS — F0390 Unspecified dementia without behavioral disturbance: Secondary | ICD-10-CM | POA: Diagnosis not present

## 2023-01-25 DIAGNOSIS — I10 Essential (primary) hypertension: Secondary | ICD-10-CM | POA: Diagnosis not present

## 2023-01-25 DIAGNOSIS — R569 Unspecified convulsions: Secondary | ICD-10-CM | POA: Diagnosis not present

## 2023-01-25 MED ORDER — IRBESARTAN 300 MG PO TABS
300.0000 mg | ORAL_TABLET | Freq: Every day | ORAL | Status: DC
  Administered 2023-01-25 – 2023-02-02 (×9): 300 mg via ORAL
  Filled 2023-01-25 (×9): qty 1

## 2023-01-25 NOTE — Discharge Summary (Incomplete)
Name: Anne [name] MRN: [id number] DOB: 01/14/1951 72 y.o. PCP: Glori Luis, MD  Date of Admission: 01/22/2023  6:41 AM Date of Discharge:  01/25/2023 Attending Physician: Dr.  Vernia Buff  DISCHARGE DIAGNOSIS:  Primary Problem: Seizure [facility])  Anne [name] is a 72 y.o. female with pertinent PMH of recently diagnosed subdural hematoma after a fall on 01/19/2023, progressive dementia who presented to [facility] ED on the morning of 01/22/2023 after a seizure and was admitted for subdural hematoma with subsequent seizure and somnolence. Head CT showed evolving subdural hematoma and patient had one tonic-clonic seizure in the ED. CBC showed thrombocytopenia, likely due to her home Depakote. Patient did not have any more witnessed seizures during her hospitalization. Overnight EEG showed cortical dysfunction of the left hemisphere with diffuse encephalopathy. Repeat CT on day 2 showed no change in size of the subdural hematoma. Strict blood pressure goal of systolic <150 set for patient. Patient meeting goal with PO amlodipine and irbesartan at time of discharge. Patient showed gradual improvement in awareness and function during hospitalization, not yet at baseline. Baseline is disorientation to time, frequent redirection, occasional irritability, but able to converse. She is not able to perform her own ADLs. Several of the patient's home medications were changed, held for re-evaluation at follow up, or changed. Please view disposition or hospital course for details.  Hospital Problems: Principal Problem:   Seizure [facility]) Active Problems:   Hypertension   Subdural hematoma (HCC)    DISCHARGE MEDICATIONS:   Allergies as of 01/25/2023       Reactions   Bee Venom Swelling     Med Rec must be completed prior to using this [facility]***       DISPOSITION AND FOLLOW-UP:  Anne [name] was discharged from [facility] in stable condition. At the hospital follow up  visit please address: Subdural hematoma, seizures, hypertension, dementia, and thrombocytopenia.  Follow-up Recommendations: Consults: Neurology, neurosurgery Labs: CBC, BMP Studies: None  Medications:  Increased: - Keppra to 1000 mg BID - Amlodipine to 10 mg daily Started: - Irbesartan 300 mg daily Stopped/held: -Depakote -Seroquel -Wellbutrin -Rivastigmine -Memantine -Zoloft   Follow-up Appointments: Neurology 03/08/2023 Family medicine (PCP) 02/25/2023  HOSPITAL COURSE:  Patient Summary: Patient Summary Anne [name] is a 72 y.o. female with pertinent PMH of recently diagnosed subdural hematoma, progressive dementia who presented to [facility] ED on the morning of 01/22/2023 after a seizure and was admitted for subdural hematoma with subsequent seizure and somnolence.   Subdural Hematoma secondary to fall from standing with Seizures and Somnolence This is a patient who experienced a fall from standing at home on Saturday morning, 12/14 and she struck the left side of her face on a piece of furniture.  At that time ED workup revealed a subdural hematoma that was stable and not requiring hospitalization but for which she was started on prophylactic Keppra 500 twice daily in part due to previous hematoma with seizures in October and spontaneous seizures in January.  Now she has recurrent seizures despite taking Keppra, and evidence of worsening of her subdural hematoma upon CT at admission. In the ED she was newly somnolent as well, as she was not speaking, kept her eyes closed, and only responded to the command to squeeze both hands. Surgery was not required at that time, but overnight EEG was ordered due to further seizure activity in the ED. EEG showed cortical dysfunction of the left hemisphere with diffuse encephalopathy. Patient did not have  any witnessed seizures after being admitted. At time of discharge somnolence was significantly improved. Patient responded to voice and was able to  converse more clearly, though this was partially limited due to poor dentitions. Patient could answer simple questions. Patient was able to follow instruction and could move all extremities. Repeat CT showed no change in size of her subdural hematoma from previous CT at time of admission.  - Systolic blood pressure goal below 150 - Held home Seroquel 25mg  per NPO due to ongoing seizures, hold home Depakote 125mg  to avoid reduction of platelet levels in setting of progressive bleed - reevaluate at follow up - Keppra 1000 mg PO Q12H with clonazepam 1 mg PO as rescue medication - Follow up with neurology    Dementia Progressive worsening over this past year.  Functional decline began before her first fall and subdural hematoma in October.  Present baseline is disorientation to time, frequent redirection, occasional irritability, but able to converse. She is not able to perform her own ADLs including grooming and cooking and lately her husband is helping her eat. Patient functional status acutely declined from baseline but improving.PT/OT/SLP spoke with patient's spouse, who agreed with SNF placement for the patient due to increased need of assistance in ADLs and IADLs.  - Discharge to SNF   - Held home memantine 10mg  daily, Rivastigmine 3mg  - reevaluate at follow up   HTN Goal SBP is below 150 per evolving subdural hematoma. Discontinued labetalol during hospitalization due to bradycardia, and avoided nitroprusside due to increased ICP. During hospitalization patient required multiple doses of IV hydralazine to bring BP closer to goal. Systolic BP over last 24 hour prior to discharge more frequently at goal, 140s-160s, without IV hydralazine. Increased home amlodipine to 10 mg and started patient on ARB given onset time of amlodipine prior to discharge.  - Home amlodipine increased to 10 mg daily - Started Irbesartan 300 mg  Thrombocytopenia  CBC at admission showed platelets 123. This is most likely  due to patient's home Depakote. This medication was held during hospitalization. Platelets at time of discharge were 131.  - Held home Depakote, please reevaluate with CBC at follow up.    Depression/Anxiety -Held home sertraline 150mg  daily, hold home Wellbutrin 300mg  in setting of seizures. Reevaluate at follow up.    Hypothyroidism Resume at discharge synthroid daily   HLD Resume at discharge rosuvastatin 20mg  daily     DISCHARGE INSTRUCTIONS:   Discharge Instructions     Ambulatory referral to Neurology   Complete by: As directed    An appointment is requested in approximately: 8-12 weeks       SUBJECTIVE:  Patient was seen sleeping comfortably in bed. Patient was awoken by voice. Patient was better able to form words today and answer simple questions, though still some difficulty understanding patient's speech due to poor dentition. Patient stated she felt "fine" and denies any pain. Patient was able to follow simple instructions. Discharge Vitals:   BP (!) 170/84 (BP Location: Right Arm)   Pulse 66   Temp (!) 97.5 F (36.4 C) (Oral)   Resp 18   Ht 5\' 8"  (1.727 m)   Wt 73.9 kg   SpO2 94%   BMI 24.77 kg/m   OBJECTIVE:  Physical Exam Vitals reviewed.  Constitutional:      General: She is not in acute distress.    Appearance: She is not ill-appearing or diaphoretic.  HENT:     Head:     Comments: Lage  contusion surrounding left orbit, mildly improved form yesterday Healing 2 cm laceration at left eyebrow    Mouth/Throat:     Mouth: Mucous membranes are moist.     Pharynx: Oropharynx is clear.  Eyes:     Conjunctiva/sclera: Conjunctivae normal.     Pupils: Pupils are equal, round, and reactive to light.  Cardiovascular:     Rate and Rhythm: Normal rate and regular rhythm.     Heart sounds: Murmur heard.     Systolic murmur is present with a grade of 2/6.  Pulmonary:     Effort: Pulmonary effort is normal.  Abdominal:     General: Abdomen is flat.  Bowel sounds are normal.     Palpations: Abdomen is soft. There is no mass.     Tenderness: There is no abdominal tenderness.  Musculoskeletal:     Right lower leg: No edema.     Left lower leg: No edema.  Skin:    General: Skin is warm and dry.  Neurological:     Mental Status: She is alert.     Comments: Able to grip examiner's fingers on instruction.  Patient able to move lower extremities on instruction.  Patient appropriately answers simple questions.  Oriented to self only.       Pertinent Labs, Studies, and Procedures:     Latest Ref Rng & Units 01/24/2023    6:06 AM 01/23/2023    5:29 AM 01/22/2023    9:17 AM  CBC  WBC 4.0 - 10.5 K/uL 6.3  5.8  4.2   Hemoglobin 12.0 - 15.0 g/dL 91.4  78.2  95.6   Hematocrit 36.0 - 46.0 % 40.9  40.3  38.2   Platelets 150 - 400 K/uL 131  148  123        Latest Ref Rng & Units 01/24/2023    6:06 AM 01/23/2023    5:29 AM 01/22/2023    9:17 AM  CMP  Glucose 70 - 99 mg/dL 79  87  90   BUN 8 - 23 mg/dL 22  20  21    Creatinine 0.44 - 1.00 mg/dL 2.13  0.86  5.78   Sodium 135 - 145 mmol/L 134  138  139   Potassium 3.5 - 5.1 mmol/L 3.7  3.7  3.3   Chloride 98 - 111 mmol/L 98  100  103   CO2 22 - 32 mmol/L 28  28  28    Calcium 8.9 - 10.3 mg/dL 9.0  9.2  8.8   Total Protein 6.5 - 8.1 g/dL   5.5   Total Bilirubin <1.2 mg/dL   0.7   Alkaline Phos 38 - 126 U/L   142   AST 15 - 41 U/L   83   ALT 0 - 44 U/L   75     Overnight EEG with video Result Date: 01/23/2023 Anne Quest, MD     01/24/2023  6:24 AM Patient Name: Anne [name] MRN: [id number] Epilepsy Attending: Charlsie [name] Referring Physician/Provider: Charlsie Quest, MD Duration: 01/22/2023 1357 to 1433, 01/23/2023 0836 to 01/24/2023 0557 Patient history: 72yo F with left SDH, now with seizure. EEG to evaluate for seizure  Level of alertness: Awake, asleep  AEDs during EEG study: LEV  Technical aspects: This EEG study was done with scalp electrodes positioned according  to the 10-20 International system of electrode placement. Electrical activity was reviewed with band pass filter of 1-70Hz , sensitivity of 7 uV/mm, display speed of 62mm/sec with a  60Hz  notched filter applied as appropriate. EEG data were recorded continuously and digitally stored. Video monitoring was available and reviewed as appropriate.  Description: No clear posterior dominant rhythm was seen.  Sleep was characterized by sleep symptoms (12 to 14 Hz), maximal frontocentral region. EEG showed continuous generalized and lateralized left hemisphere 3-5 Hz theta-delta slowing.   Hyperventilation and photic stimulation were not performed.   Study was not recorded between 12/70/2024 4033 to 01/13/2023 0730 due to technical issues Of note, parts of the study were difficult to interpret due to significant electrode artifact  ABNORMALITY -Continuous slow, generalized and lateralized left hemisphere  IMPRESSION: This technically difficult study is suggestive of cortical dysfunction in left hemisphere likely secondary to underlying structural abnormality.  Additionally there is moderate to severe diffuse encephalopathy. No seizures were noted.  Anne [name]    EEG adult Result Date: 01/22/2023 Anne Quest, MD     01/23/2023 10:08 AM Patient Name: Anne [name] MRN: [id number] Epilepsy Attending: Charlsie [name] Referring Physician/Provider: Lynnae January, NP Date: 01/22/2023 Duration: 25.21 mins Patient history: 72yo F with left SDH, now with seizure. EEG to evaluate for seizure Level of alertness: Awake, asleep AEDs during EEG study: LEV Technical aspects: This EEG study was done with scalp electrodes positioned according to the 10-20 International system of electrode placement. Electrical activity was reviewed with band pass filter of 1-70Hz , sensitivity of 7 uV/mm, display speed of 58mm/sec with a 60Hz  notched filter applied as appropriate. EEG data were recorded continuously and digitally stored.  Video  monitoring was available and reviewed as appropriate. Description: No clear posterior dominant rhythm was seen.  Sleep was characterized by sleep symptoms (12 to 14 Hz), maximal frontocentral region. EEG showed continuous generalized 3-5 Hz theta-delta slowing.  Between 1122 to 1124, lateralized periodic discharges were noted in left hemisphere, maximal left frontal region at 1Hz .  On video, patient was noted to be asleep, no clinical signs were noted.  Again around 1133, EEG showed rhythmic 2 to 3 Hz delta slowing admixed with lateralized periodic discharges in the left hemisphere, maximal left frontal region.  Per EEG tech, "leg twitching" was noted which was difficult to visualize on camera.  At 1136, EEG evolved into sharply contoured 5 to 6 Hz theta slowing admixed with spikes in left frontal region which then involve of left hemisphere and subsequently evolved into 2 to 3 Hz delta slowing.  This EEG pattern is consistent with focal seizure.  Again no definite clinical signs were noted due to limited visibility of video.  Duration of seizure was about 4 minutes Hyperventilation and photic stimulation were not performed.   ABNORMALITY -Focal seizure, left frontal region -Lateralized periodic discharges, left hemisphere, left maximal left frontal region -Continuous slow, generalized IMPRESSION: This study showed one focal seizure arising from left frontal region as described above on 01/22/2023 at 1136, lasting for about 4 minutes.  Additionally EEG showed evidence of epileptogenicity in left hemisphere, maximal left frontal region with increased risk of seizure recurrence.  Lastly there was moderate to severe diffuse encephalopathy. Recommend long-term EEG for further monitoring of seizures Dr. Selina Cooley was notified. Anne [name]   CT Head Wo Contrast Result Date: 01/22/2023 CLINICAL DATA:  Seizure disorder with clinical change. EXAM: CT HEAD WITHOUT CONTRAST TECHNIQUE: Contiguous axial images were obtained  from the base of the skull through the vertex without intravenous contrast. RADIATION DOSE REDUCTION: This exam was performed according to the departmental dose-optimization program which includes automated exposure  control, adjustment of the mA and/or kV according to patient size and/or use of iterative reconstruction technique. COMPARISON:  Three days ago FINDINGS: Brain: Subdural hematoma on the left, primarily high-density and increased from before. Maximal thickness is 1 cm on coronal reformats with mild cortical mass effect. The hematoma is seen both laterally and inferior to the left frontal lobe. There is a low-density hygroma appearance in the subdural space at the superior right frontal convexity since prior. A trace high-density subdural hematoma inferior to the right frontal lobe is unchanged at 2 mm thickness. No evidence of underlying infarct. No hydrocephalus or mass. Vascular: Atheromatous calcification. Skull: Unremarkable Sinuses/Orbits: Bilateral cataract resection IMPRESSION: 1. Increase in acute subdural hematoma on the left, measuring up to 1 cm. No shift of the atrophic brain. 2. Subdural hemorrhage inferior to the right frontal lobe is stable and trace when compared to prior. There is a degree of increased/new hygroma at the right vertex since prior. Electronically Signed   By: Tiburcio Pea M.D.   On: 01/22/2023 08:13     Signed: Ezekiel Slocumb, MS3 Pager: [phone] 10:41 AM, 01/25/2023

## 2023-01-25 NOTE — TOC Progression Note (Signed)
Transition of Care [facility]) - Progression Note    Patient Details  Name: Anne [name] MRN: [id number] Date of Birth: 1951/02/05  Transition of [facility] CM/SW Contact  Erin Sons, [name] Phone Number: 01/25/2023, 2:16 PM  Clinical Narrative:     CSW met with pt's spouse bedside. Provided SNF bed offers and medicare star ratings. He was hoping for a facility closer to their home in [place]. [place] would be closer than [place] though only SNF's that offered bed were in [place]. Spouse states he would need to discuss with pt's daughter regarding choice. CSW agreed to resend referrals to [facility] facilities in case a new bed offer arises. TOC to continue to follow.   Expected Discharge Plan: Skilled Nursing Facility Barriers to Discharge: Insurance Authorization, Other (must enter comment) (need snf choice)  Expected Discharge Plan and Services In-house Referral: Clinical Social Work   Post Acute Care Choice: Skilled Nursing Facility Living arrangements for the past 2 months: Single Family Home                                       Social Determinants of Health (SDOH) Interventions SDOH Screenings   Food Insecurity: Patient Unable To Answer (01/22/2023)  Housing: Patient Unable To Answer (01/22/2023)  Transportation Needs: Patient Unable To Answer (01/22/2023)  Utilities: Patient Unable To Answer (01/22/2023)  Depression (PHQ2-9): High Risk (06/21/2022)  Financial Resource Strain: Low Risk  (09/08/2020)  Physical Activity: Unknown (10/29/2018)  Social Connections: Unknown (09/08/2020)  Stress: No Stress Concern Present (09/08/2020)  Tobacco Use: Medium Risk (01/22/2023)    Readmission Risk Interventions     No data to display

## 2023-01-25 NOTE — Discharge Instructions (Addendum)
Anne [name], you came to the emergency after you had a seizure that caused you to fall and hit your head. CT scan of your head in the emergency department showed that your previously diagnosed subdural hematoma (brain bleed) from your fall earlier in December had increased in size and you were admitted to the hospital. While in the hospital you had two overnight electroencephalograms (EEG) performed. This is a test that looks at the electrical activity of your brain. They did not show any seizure activity over night, but did show some changes in the activity on the left side of your brain that may be related to your brain bleed. Repeat a repeat scan of your brain later in your hospitalization showed that the brain bleed had stabilized. During your hospitalization we stopped several of your home medications that may have contributed to the seizure and fall. We also changed your home blood pressure medications. When you have a brain bleed it is very important to consistently take your blood pressure medications, as high blood pressure can slow the healing process.  The changes in you medications are listed below: -For your high blood pressure: - STOP taking amlodipine 5 mg daily - START taking amlodipine 10 mg daily  - START taking irbesartan 300 mg daily   For your seizures: - STOP taking Keppra 500 mg twice a day - START taking Keppra 1000 mg twice a day  - STOP taking Depakote  - For seizures that last more than 2 minutes use Clonazepam 1 mg oral dissolving tablet   For your high cholesterol: - Continue taking Crestor 20 mg daily   For your hypothyroidism: - Continue taking Synthroid 88 mcg daily   For you dementia and depression - STOP taking Wellbutrin - STOP taking Seroquel  - STOP taking Rivastigmine  - STOP taking Memantine  - STOP taking Zoloft   During your hospitalization you were seen by physical therapy, occupational therapy, and speech language pathology and it was determined  that it was safest for you to be discharged to a skilled nursing facility to help with your recovery. During your stay at this facility or afterwards you will have follow up appointments with neurology and a hospital follow up with you primary care provider.   Here is some guidance for patients that have had seizures:  Per [facility] statutes, patients with seizures are not allowed to drive until they have been seizure-free for six months and cleared by a physician    Use caution when using heavy equipment or power tools. Avoid working on ladders or at heights. Take showers instead of baths. Ensure the water temperature is not too high on the home water heater. Do not go swimming alone. Do not lock yourself in a room alone (i.e. bathroom). When caring for infants or small children, sit down when holding, feeding, or changing them to minimize risk of injury to the child in the event you have a seizure. Maintain good sleep hygiene. Avoid alcohol.    If patient has another seizure, call 911 and bring them back to the ED if: A.  The seizure lasts longer than 5 minutes.      B.  The patient doesn't wake shortly after the seizure or has new problems such as difficulty seeing, speaking or moving following the seizure C.  The patient was injured during the seizure D.  The patient has a temperature over 102 F (39C) E.  The patient vomited during the seizure and now is  having trouble breathing    During the Seizure   - First, ensure adequate airway/ventilation and place patients on the floor on their left side  Loosen clothing around the neck and ensure the airway is patent. If the patient is clenching the teeth, do not force the mouth open with any object as this can cause severe damage - Remove all items from the surrounding that can be hazardous. The patient may be oblivious to what's happening and may not even know what he or she is doing. If the patient is confused and wandering, either gently  guide him/her away and block access to outside areas - Reassure the individual and be comforting - Call 911. In most cases, the seizure ends before EMS arrives. However, there are cases when seizures may last over 3 to 5 minutes. Or the individual may have developed breathing difficulties or severe injuries. If a pregnant patient or a person with diabetes develops a seizure, it is prudent to call an ambulance.      After the Seizure (Postictal Stage)   After a seizure, most patients experience confusion, fatigue, muscle pain and/or a headache. Thus, one should permit the individual to sleep. For the next few days, reassurance is essential. Being calm and helping reorient the person is also of importance.   Most seizures are painless and end spontaneously. Seizures are not harmful to others but can lead to complications such as stress on the lungs, brain and the heart. Individuals with prior lung problems may develop labored breathing and respiratory distress.   It was a pleasure taking care of you.  Mia Hancock Medical Student, MS3

## 2023-01-25 NOTE — Care Management Important Message (Signed)
Important Message  Patient Details  Name: Anne [name] MRN: [id number] Date of Birth: Jun 23, 1950   Important Message Given:  Yes - Medicare IM     Dorena Bodo 01/25/2023, 2:52 PM

## 2023-01-25 NOTE — Progress Notes (Signed)
HD#3 SUBJECTIVE:  Patient Summary: Anne [name] is a 72 y.o. with a pertinent PMH of recently diagnosed subdural hematoma, seizure disorder, progressive dementia, hypertension, and hypothyroidism who presented after a seizure. In the ED patient had another tonic clonic seizure and CT showed increase in her previous subdural hematoma with left shift. Patient was admitted for seizure-like activity in the setting of evolving subdural hematoma.   Overnight Events: BP closer to goal overnight, 140's to 106's without IV hydralazine.   Interim History: Patient was seen resting comfortably in bed. Patient responded to voice and was more alert than yesterday. Patient able to converse more clearly today and appropriately answered simple questions. Though speech still somewhat difficult to understand due to poor dentition. Patient stated that she felt "fine" and denies any pain. Patient oriented to self. Patient was able to follow simple instructions.   OBJECTIVE:  Vital Signs: Vitals:   01/24/23 1943 01/24/23 2321 01/25/23 0335 01/25/23 0725  BP: (!) 152/82 (!) 161/107 (!) 130/99 (!) 170/84  Pulse: 69 95 68 66  Resp: 18 18 18 18   Temp: 97.6 F (36.4 C)  97.9 F (36.6 C) (!) 97.5 F (36.4 C)  TempSrc: Oral  Oral Oral  SpO2: 96% (!) 86% 94% 94%  Weight:      Height:       Supplemental O2: Nasal Cannula SpO2: 94 % O2 Flow Rate (L/min): 2 L/min  Filed Weights   01/23/23 1417  Weight: 73.9 kg     Intake/Output Summary (Last 24 hours) at 01/25/2023 1040 Last data filed at 01/24/2023 1247 Gross per 24 hour  Intake 75 ml  Output --  Net 75 ml   Net IO Since Admission: -459 mL [01/25/23 1040]  Physical Exam: Physical Exam Vitals and nursing note reviewed.  Constitutional:      General: She is not in acute distress.    Appearance: She is not diaphoretic.  HENT:     Head:     Comments: Large contusion around the left orbit - improved from yesterday Healing 2 cm laceration at the  left eyebrow.     Mouth/Throat:     Mouth: Mucous membranes are moist.     Pharynx: Oropharynx is clear.  Eyes:     Conjunctiva/sclera: Conjunctivae normal.     Pupils: Pupils are equal, round, and reactive to light.  Cardiovascular:     Rate and Rhythm: Normal rate and regular rhythm.     Heart sounds: Murmur heard.     Systolic murmur is present with a grade of 2/6.  Pulmonary:     Effort: Pulmonary effort is normal.  Abdominal:     General: Abdomen is flat. Bowel sounds are normal. There is no distension.     Palpations: Abdomen is soft. There is no mass.     Tenderness: There is no abdominal tenderness. There is no guarding.  Musculoskeletal:     Right lower leg: No edema.     Left lower leg: No edema.     Comments: Able to move all extremities.   Skin:    General: Skin is warm and dry.  Neurological:     Comments: Able to grip examiner's fingers on instruction.  Patient able to move lower extremities on instruction.  Patient appropriately answers simple questions.  Oriented to self only.       Patient Lines/Drains/Airways Status     Active Line/Drains/Airways     Name Placement date Placement time Site Days   Peripheral IV  01/22/23 18 G Left Antecubital 01/22/23  0705  Antecubital   1   Wound / Incision (Open or Dehisced) 10/29/18 Other (Comment) Leg Left;Posterior;Upper Boil 10/29/18  0058  Leg  1547             ASSESSMENT/PLAN:  Assessment: Principal Problem:   Seizure (HCC) Active Problems:   Hypertension   Subdural hematoma (HCC)   Plan: Patient Summary Anne [name] is a 72 y.o. female with pertinent PMH of recently diagnosed subdural hematoma, progressive dementia who presented to [facility] ED on the morning of 01/22/2023 after a seizure and was admitted for subdural hematoma with subsequent seizure and somnolence.   Subdural Hematoma secondary to fall from standing with Seizures and Somnolence This is a patient who experienced a fall from standing at  home on Saturday morning, 12/14 and she struck the left side of her face on a piece of furniture.  At that time ED workup revealed a subdural hematoma that was stable and not requiring hospitalization but for which she was started on prophylactic Keppra 500 twice daily in part due to previous hematoma with seizures in October and spontaneous seizures in January.  Now she has recurrent seizures despite taking Keppra, and evidence of worsening of her subdural hematoma upon CT at admission. In the ED she was newly somnolent as well, as she was not speaking, kept her eyes closed, and only responded to the command to squeeze both hands. Surgery was not required at that time, but overnight EEG was ordered due to further seizure activity in the ED. EEG showed cortical dysfunction of the left hemisphere with diffuse encephalopathy. Patient did not have any witnessed seizures after being admitted. Today somnolence was significantly improved. Patient responded to voice and was able to converse more clearly, though this was partially limited due to poor dentitions. Patient could answer simple questions. Patient was able to follow instruction and could move all extremities. Repeat CT showed no change in size of her subdural hematoma from previous CT at time of admission.  - Systolic blood pressure goal below 150 - Held home Seroquel 25mg  per NPO due to ongoing seizures, hold home Depakote 125mg  to avoid reduction of platelet levels in setting of progressive bleed - reevaluate at follow up - Keppra 1000 mg PO Q12H with clonazepam 1 mg PO as rescue medication - Neuro following, appreciate recommendations  - Continue following neuro status     Dementia Progressive worsening over this past year.  Functional decline began before her first fall and subdural hematoma in October.  Present baseline is disorientation to time, frequent redirection, occasional irritability, but able to converse. She is not able to perform her own  ADLs including grooming and cooking and lately her husband is helping her eat. Patient functional status acutely declined from baseline but improving.PT/OT/SLP spoke with patient's spouse, who agreed with SNF placement for the patient due to increased need of assistance in ADLs and IADLs.  - Discharge to SNF   - Held home memantine 10mg  daily, Rivastigmine 3mg  - reevaluate at follow up   HTN Goal SBP is below 150 per evolving subdural hematoma. Discontinued labetalol during hospitalization due to bradycardia, and avoided nitroprusside due to increased ICP. During hospitalization patient required multiple doses of IV hydralazine to bring BP closer to goal. Systolic BP over last 24 hours more frequently at goal, 140s-160s, without IV hydralazine. Increased home amlodipine to 10 mg and started patient on ARB given onset time of amlodipine..  - Home amlodipine  increased to 10 mg daily - Irbesartan 300 mg daily   Thrombocytopenia  CBC at admission showed platelets 123. This is most likely due to patient's home Depakote. This medication was held during hospitalization. Platelets most recently 131.  - Held home Depakote - daily CBC    Depression/Anxiety -Held home sertraline 150mg  daily, hold home Wellbutrin 300mg  in setting of seizures. Reevaluate at follow up.    Hypothyroidism Resume at discharge synthroid daily   HLD Resume at discharge rosuvastatin 20mg  daily   Best Practice: Diet: NPO IVF: Fluids: None, Rate: None VTE: SCDs Start: 01/22/23 1400 Code: Full AB: None Therapy Recs:  N/a , DME: none  Family Contact: Husband, to be notified. DISPO: Anticipated discharge  today or tomorrow  to Skilled nursing facility pending  SNF placement .  Signature: Ezekiel Slocumb  Medical Student, MS3 Pager: [phone] 10:40 AM, 01/25/2023

## 2023-01-26 ENCOUNTER — Inpatient Hospital Stay (HOSPITAL_COMMUNITY): Payer: PPO

## 2023-01-26 DIAGNOSIS — R569 Unspecified convulsions: Secondary | ICD-10-CM | POA: Diagnosis not present

## 2023-01-26 MED ORDER — ENSURE ENLIVE PO LIQD
1.0000 | Freq: Three times a day (TID) | ORAL | Status: DC
  Administered 2023-01-26 – 2023-02-17 (×62): 237 mL via ORAL

## 2023-01-26 MED ORDER — ACETAMINOPHEN 500 MG PO TABS
1000.0000 mg | ORAL_TABLET | Freq: Four times a day (QID) | ORAL | Status: DC | PRN
  Administered 2023-01-26 – 2023-01-27 (×2): 1000 mg via ORAL
  Filled 2023-01-26 (×2): qty 2

## 2023-01-26 NOTE — Progress Notes (Signed)
Patient had a fall (unwitnessed), while trying to get out of bed, hit her head, had a cut in her head (right temporal) has minimal bleeding, had skin tear in right arm, vital signs stable and no change in neuro status, MD informed, got order for CT scan, husband informed of the event, fall precautions continued, will continue to monitor.

## 2023-01-26 NOTE — Progress Notes (Signed)
HD#4 SUBJECTIVE:  Patient Summary: Anne [name] is a 72 y.o. with a pertinent PMH of recently diagnosed subdural hematoma, seizure disorder, progressive dementia, hypertension, and hypothyroidism who presented after a seizure. In the ED patient had another tonic clonic seizure and CT showed increase in her previous subdural hematoma with left shift. Patient was admitted for seizure-like activity in the setting of evolving subdural hematoma.   Overnight Events: None  Interim History: Pt resting in bed. She is responding to questions well. She denies pain or distress. She would like to go home.  OBJECTIVE:  Vital Signs: Vitals:   01/25/23 2319 01/26/23 0414 01/26/23 0805 01/26/23 1213  BP: (!) 145/80 (!) 124/110 (!) 141/85 (!) 146/77  Pulse: 73 71 85 94  Resp: 16 15 16 18   Temp: 99.3 F (37.4 C) 97.6 F (36.4 C) 98 F (36.7 C) 98 F (36.7 C)  TempSrc: Oral Oral Oral Oral  SpO2: 91% 90% 97% 94%  Weight:      Height:       Supplemental O2: Nasal Cannula SpO2: 94 % O2 Flow Rate (L/min): 2 L/min  Filed Weights   01/23/23 1417  Weight: 73.9 kg     Intake/Output Summary (Last 24 hours) at 01/26/2023 1332 Last data filed at 01/25/2023 2113 Gross per 24 hour  Intake 390 ml  Output --  Net 390 ml   Net IO Since Admission: 167 mL [01/26/23 1332]  Physical Exam: Physical Exam Constitutional:      General: She is not in acute distress.    Appearance: Normal appearance. She is not ill-appearing.  HENT:     Head: Normocephalic.     Comments: L healing contusion on L upper face, approximately 6 inches across Cardiovascular:     Rate and Rhythm: Normal rate and regular rhythm.     Heart sounds: Murmur heard.     Comments: Known 2/6 systolic murmur Pulmonary:     Effort: Pulmonary effort is normal.  Musculoskeletal:     Right lower leg: No edema.     Left lower leg: No edema.  Skin:    General: Skin is warm.  Neurological:     Mental Status: She is alert. Mental  status is at baseline.     Comments: Oriented to person and place. Moves all extremities. Patient appropriately answers simple questions.   Psychiatric:        Mood and Affect: Mood normal.        Behavior: Behavior normal.     Patient Lines/Drains/Airways Status     Active Line/Drains/Airways     Name Placement date Placement time Site Days   Peripheral IV 01/26/23 22 G 1.16" Anterior;Right Forearm 01/26/23  0530  Forearm  less than 1   Wound / Incision (Open or Dehisced) 10/29/18 Other (Comment) Leg Left;Posterior;Upper Boil 10/29/18  0058  Leg  1550             ASSESSMENT/PLAN:  Assessment: Principal Problem:   Seizure (HCC) Active Problems:   Hypertension   Subdural hematoma [facility])  Patient Summary Anne [name] is a 72 y.o. female with pertinent PMH of recently diagnosed subdural hematoma, progressive dementia who presented to [facility] ED on the morning of 01/22/2023 after a seizure and was admitted for subdural hematoma with subsequent seizure and somnolence.  Medical workup is complete. She is pending discharge to SNF.  Plan: Subdural Hematoma secondary to fall from standing with Seizures and Somnolence Stable. No repeat seizures since admission and hematoma stable  on repeat imaging. Present illness due to fall from standing at home on 12/14, discharged home from ED on prophylactic Keppra 500, seizures on 12/17 and worsening of her subdural hematoma at admission.  Since then, has not required surgery, no more seizures, BP goal below 150 systolic. Initial somnolence has improved steadily with each day, now she is alert and back to a baseline with moderate dementia. - Systolic blood pressure goal below 150 - Held home Seroquel 25mg  per NPO due to ongoing seizures, hold home Depakote 125mg  to avoid reduction of platelet levels in setting of progressive bleed - reevaluate at follow up - Keppra 1000 mg PO Q12H with clonazepam 1 mg PO as rescue medication - Neuro following,  appreciate recommendations  - Continue following neuro status    Dementia Returning to baseline. Functional decline began before her first fall and subdural hematoma in October.  Present baseline is disorientation to time, frequent redirection, occasional irritability, but able to converse. She is not able to perform her own ADLs.  PT/OT/SLP spoke with patient's spouse, who agreed with SNF placement for the patient due to increased need of assistance in ADLs and IADLs.  - Anticipate discharge to SNF, but new PT/OT eval today with improving mentation - Held home memantine 10mg  daily, Rivastigmine 3mg  - reevaluate at follow up   HTN Goal SBP is below 150 per evolving subdural hematoma. Discontinued labetalol during hospitalization due to bradycardia, and avoided nitroprusside due to increased ICP. Increased home amlodipine to 10 mg and started patient on ARB given onset time of amlodipine..  - Home amlodipine increased to 10 mg daily - Irbesartan 300 mg daily    Thrombocytopenia  CBC at admission showed platelets 123. This is most likely due to patient's home Depakote. This medication was held during hospitalization. Platelets most recently 131.  - Held home Depakote - daily CBC    Depression/Anxiety -Held home sertraline 150mg  daily, hold home Wellbutrin 300mg  in setting of seizures. Reevaluate at follow up.    Hypothyroidism Resume at discharge synthroid daily   HLD Resume at discharge rosuvastatin 20mg  daily  Best Practice: Diet: NPO IVF: Fluids: None, Rate: None VTE: SCDs Start: 01/22/23 1400 Code: Full AB: None Therapy Recs:  N/a , DME: none  DISPO: Anticipated discharge  today or tomorrow  to Skilled nursing facility pending  SNF placement .  Signature: Katheran James, D.O.  Internal Medicine Resident, PGY-1 [facility]  Pager: [phone] 1:32 PM, 01/26/2023   Please contact the on call pager after 5 pm and on weekends at  [phone].

## 2023-01-26 NOTE — Progress Notes (Signed)
Patient off the unit for CT.

## 2023-01-26 NOTE — Progress Notes (Signed)
CT head resulted with increased size of left subdural hematoma that is now 1.3 cm and there is also a new midline shift of 6.0 mm. Spoke with on-call neurosurgeon, Dr. Conchita Paris, who states that she does not need to go to surgery now since her neuro exam is unchanged. Will re-page neurosurgery if she has a change in her neurologic exam.

## 2023-01-26 NOTE — Progress Notes (Signed)
Patient back to the unit.

## 2023-01-26 NOTE — Progress Notes (Signed)
Notified around 1615 that patient had just prior experienced an unwitnessed fall while attempting to leave her bed. She was found sitting upright by the L side of her bed and immediately attended to by her nurses. All guard rails on bed were extended, she likely crawled over them. A laceration of about 2.5cm in length was found on her R posterior scalp (it is unclear where her head struck) alongside a shallow abrasion near her R wrist. Ms Kiester did not recall attempting to leave her bed. She did not exhibit any acute neurological changes or focal deficits. She remained at her previous level of mentation in the setting of dementia, which is distractible and oriented to person only. She denied discomfort initially but later reported a headache around 1830 for which tylenol was ordered. In the setting of her subdural hematoma a stat CT was ordered and it was completed around 1750 and read is pending. For her scalp laceration, two sutures were placed.

## 2023-01-26 NOTE — Plan of Care (Signed)
  Problem: Clinical Measurements: Goal: Ability to maintain clinical measurements within normal limits will improve Outcome: Progressing Goal: Will remain free from infection Outcome: Progressing Goal: Diagnostic test results will improve Outcome: Progressing Goal: Respiratory complications will improve Outcome: Progressing Goal: Cardiovascular complication will be avoided Outcome: Progressing   Problem: Activity: Goal: Risk for activity intolerance will decrease Outcome: Progressing   Problem: Elimination: Goal: Will not experience complications related to bowel motility Outcome: Progressing Goal: Will not experience complications related to urinary retention Outcome: Progressing   Problem: Safety: Goal: Ability to remain free from injury will improve Outcome: Progressing   Problem: Skin Integrity: Goal: Risk for impaired skin integrity will decrease Outcome: Progressing   

## 2023-01-27 ENCOUNTER — Inpatient Hospital Stay (HOSPITAL_COMMUNITY): Payer: PPO

## 2023-01-27 DIAGNOSIS — R569 Unspecified convulsions: Secondary | ICD-10-CM | POA: Diagnosis not present

## 2023-01-27 LAB — BASIC METABOLIC PANEL
Anion gap: 8 (ref 5–15)
BUN: 24 mg/dL — ABNORMAL HIGH (ref 8–23)
CO2: 29 mmol/L (ref 22–32)
Calcium: 9.5 mg/dL (ref 8.9–10.3)
Chloride: 101 mmol/L (ref 98–111)
Creatinine, Ser: 0.93 mg/dL (ref 0.44–1.00)
GFR, Estimated: >60 mL/min (ref >60)
Glucose, Bld: 115 mg/dL — ABNORMAL HIGH (ref 70–99)
Potassium: 3.9 mmol/L (ref 3.5–5.1)
Sodium: 138 mmol/L (ref 135–145)

## 2023-01-27 LAB — CBC
HCT: 44.7 % (ref 36.0–46.0)
Hemoglobin: 14.8 g/dL (ref 12.0–15.0)
MCH: 29.8 pg (ref 26.0–34.0)
MCHC: 33.1 g/dL (ref 30.0–36.0)
MCV: 89.9 fL (ref 80.0–100.0)
Platelets: 203 10*3/uL (ref 150–400)
RBC: 4.97 MIL/uL (ref 3.87–5.11)
RDW: 15 % (ref 11.5–15.5)
WBC: 8 10*3/uL (ref 4.0–10.5)
nRBC: 0 % (ref 0.0–0.2)

## 2023-01-27 MED ORDER — SENNOSIDES-DOCUSATE SODIUM 8.6-50 MG PO TABS
1.0000 | ORAL_TABLET | Freq: Every day | ORAL | Status: DC
  Administered 2023-01-27 – 2023-02-18 (×19): 1 via ORAL
  Filled 2023-01-27 (×21): qty 1

## 2023-01-27 MED ORDER — OXYCODONE HCL 5 MG PO TABS
5.0000 mg | ORAL_TABLET | ORAL | Status: DC | PRN
  Administered 2023-02-08 – 2023-02-14 (×2): 5 mg via ORAL
  Filled 2023-01-27 (×2): qty 1

## 2023-01-27 MED ORDER — ACETAMINOPHEN 325 MG PO TABS
650.0000 mg | ORAL_TABLET | Freq: Four times a day (QID) | ORAL | Status: DC
Start: 2023-01-27 — End: 2023-02-18
  Administered 2023-01-27 – 2023-02-18 (×74): 650 mg via ORAL
  Filled 2023-01-27 (×75): qty 2

## 2023-01-27 NOTE — Progress Notes (Signed)
   Providing Compassionate, Quality Care - Together  NEUROSURGERY PROGRESS NOTE   I had a family meeting at bedside with the patient's husband and 2 daughters and grandchildren.  She remains slightly more alert, slightly interactive with family but is not speaking at this time.  Given her neurologic baseline of moderate to severe dementia requiring 24/7 care and her frailty, I did not recommend any neurosurgical intervention for her large subdural hematoma.  Her daughters and husband and grandchildren were in agreements with this that she would not want that large of the surgery.  I extensively went over her CT scan findings and answered all of their questions.  Everyone was in agreement of this plan.  We will consult palliative care for further goals of care.  At this time they would like to leave her CODE STATUS the same.  Thank you for allowing me to participate in this patient's care.  Please do not hesitate to call with questions or concerns.   Monia Pouch, DO Neurosurgeon [facility] [phone]

## 2023-01-27 NOTE — Progress Notes (Addendum)
Patient was seen by myself and Dr. Ned Card. She states that her headache has gotten better. On neuroexam, she is oriented to self, place, month, but not year.   She is able to move all extremities and also follow instructions.  Patient's neuro exam is unchanged as compared to prior.

## 2023-01-27 NOTE — Progress Notes (Signed)
   Providing Compassionate, Quality Care - Together  NEUROSURGERY PROGRESS NOTE   S: fall overnight, CT head showed progression of SDH; per RN/family more lethargic today  O: EXAM:  BP (!) 147/86 (BP Location: Left Arm)   Pulse 81   Temp (!) 97.3 F (36.3 C) (Oral)   Resp 16   Ht 5\' 8"  (1.727 m)   Wt 73.9 kg   SpO2 93%   BMI 24.77 kg/m   Lethargic, opens eyes to stimulation PERRL Frontal left periorbital ecchymoses MAE, FC slowly BUE/BLE Falls back to sleep easily Verbalizes name only  Images: CT brain reviewed, there is progression compared to prior CT yesterday and on 1219.  There is approximately 8 mm of midline shift, rightward.  There is a moderate sized mixed density subdural hematoma with localized mass effect, no signs of herniation  ASSESSMENT:  72 y.o. female with   Left Mixed density SDH with MLS  PLAN: -I had a discussion with the patient's husband at bedside, and over the phone with Crystal, the patient's daughter.  Crystal and the other daughter on their way and plan to be here around 4 PM.  The patient at baseline has moderate dementia and requires 24/7 care.  Given this, I explained the intervention of a left frontotemporal craniotomy for evacuation of subdural hematoma however given her significant dementia at baseline I do not know if the patient or the family would want such intervention and I believe her ability for recovery will be limited unfortunately. -N.p.o. for now -I will have further family discussion with the daughters at bedside once they arrive    Thank you for allowing me to participate in this patient's care.  Please do not hesitate to call with questions or concerns.   Monia Pouch, DO Neurosurgeon [facility] [phone]

## 2023-01-27 NOTE — Plan of Care (Signed)
  Problem: Safety: Goal: Ability to remain free from injury will improve Outcome: Progressing   Problem: Pain Management: Goal: General experience of comfort will improve Outcome: Progressing   Problem: Skin Integrity: Goal: Risk for impaired skin integrity will decrease Outcome: Progressing   Problem: Elimination: Goal: Will not experience complications related to bowel motility Outcome: Progressing Goal: Will not experience complications related to urinary retention Outcome: Progressing   Problem: Clinical Measurements: Goal: Ability to maintain clinical measurements within normal limits will improve Outcome: Progressing Goal: Will remain free from infection Outcome: Progressing Goal: Diagnostic test results will improve Outcome: Progressing Goal: Respiratory complications will improve Outcome: Progressing Goal: Cardiovascular complication will be avoided Outcome: Progressing

## 2023-01-27 NOTE — Progress Notes (Addendum)
HD#5 SUBJECTIVE:  Patient Summary: Anne [name] is a 72 y.o. with a pertinent PMH of recently diagnosed subdural hematoma, seizure disorder, progressive dementia who presented after a seizure. In the ED patient had another tonic clonic seizure and CT showed increase in her previous subdural hematoma with left shift. Patient was admitted for seizure-like activity in the setting of evolving subdural hematoma.   Overnight Events: Yesterday afternoon pt fell from bed while attempting to leave (around 4pm). No change in neuro exam. 2.5cm posterior scalp laceration given two sutures. STAT CT revealed increase in her subdural hematoma from 1 to 1.3 cm with 6mm rightward midline shift. Upon discussion with neurosurgery, as neuro exam is stable, no present indication for surgery.  Interim History: Anne [name] is doing well.  She denies any pain or discomfort.  She is alert this morning and response to stimulus.  She answers questions occasionally with short words.  She is fully cooperative.  Does recall her fall yesterday, she agrees not to try to leave the bed again.  OBJECTIVE:  Vital Signs: Vitals:   01/27/23 0103 01/27/23 0417 01/27/23 0551 01/27/23 0818  BP: (!) 142/78 (!) 153/87 (!) 144/83 139/84  Pulse: 86 82 78 73  Resp: 19 16 18 16   Temp: 97.7 F (36.5 C) (!) 97.5 F (36.4 C)  97.6 F (36.4 C)  TempSrc: Oral Oral  Oral  SpO2: 92% 92%  94%  Weight:      Height:       Supplemental O2: Room Air SpO2: 94 % O2 Flow Rate (L/min): 2 L/min  Filed Weights   01/23/23 1417  Weight: 73.9 kg    No intake or output data in the 24 hours ending 01/27/23 0919 Net IO Since Admission: 167 mL [01/27/23 0919]  Physical Exam: Physical Exam Constitutional:      General: She is not in acute distress.    Appearance: She is not ill-appearing.  HENT:     Head:     Comments: Large contusion on L face remains and appears to be healing well. There remains swelling above the L eyebrow approximately  golf ball sized. New laceration on R posterior scalp remains from yesterday, one suture appears to have dislodged, margins are closed, not bleeding.    Mouth/Throat:     Mouth: Mucous membranes are dry.  Eyes:     Extraocular Movements: Extraocular movements intact.     Pupils: Pupils are equal, round, and reactive to light.  Cardiovascular:     Rate and Rhythm: Normal rate and regular rhythm.  Pulmonary:     Effort: Pulmonary effort is normal.  Abdominal:     General: Abdomen is flat.     Palpations: Abdomen is soft.     Tenderness: There is no abdominal tenderness.  Musculoskeletal:     Right lower leg: No edema.     Left lower leg: No edema.  Skin:    General: Skin is warm.  Neurological:     General: No focal deficit present.     Mental Status: She is alert. Mental status is at baseline.     Cranial Nerves: No cranial nerve deficit.     Sensory: No sensory deficit.     Motor: No weakness.     Comments: Oriented to person only, her baseline.  Psychiatric:        Mood and Affect: Mood normal.        Behavior: Behavior normal.     Patient Lines/Drains/Airways Status  Active Line/Drains/Airways     Name Placement date Placement time Site Days   Peripheral IV 01/26/23 22 G 1.16" Anterior;Right Forearm 01/26/23  0530  Forearm  1   Wound / Incision (Open or Dehisced) 10/29/18 Other (Comment) Leg Left;Posterior;Upper Boil 10/29/18  0058  Leg  1551   Wound / Incision (Open or Dehisced) 01/26/23 Laceration Head Posterior;Right;Upper laceration 01/26/23  1610  Head  1             ASSESSMENT/PLAN:  Assessment: Principal Problem:   Seizure (HCC) Active Problems:   Hypertension   Subdural hematoma [name])  Patient Summary Anne [name] is a 72 y.o. female with pertinent PMH of recently diagnosed subdural hematoma, progressive dementia who presented to [facility] ED on the morning of 01/22/2023 after a seizure and was admitted for subdural hematoma with subsequent seizure and  somnolence.    Plan: Subdural Hematoma secondary to fall from standing with Seizures and Somnolence New fall from hospital bed causing rebleed, scalp laceration After fall from bed yesterday, L-sided subdural hematoma has increased in size 1.0cm to 1.3cm and there is a new rightward 6mm midline shift. That said, her neurologic exam remains stable and at baseline: Which is completely intact cranial nerves, strength intact without focal deficits, baseline mentation is orientation to self only, she is responsive to stimulation, responds to speech, occasionally answers questions with short words, follows all commands, not lethargic. Neurosurgery rec against surgery unless neuro exam changes. No recurrent seizures. She had a laceration during the fall yesterday on the right posterior scalp, it is about 2.5 cm, 2 sutures were placed, this morning the wound appears to be healing, margins are closed, no signs of recent bleed or an infection.  Will repeat CT today to assess for continuation of bleed. - Repeat CT head without contrast: UPDATE - progression of hematoma on second scan - now 1.7cm across with 8mm rightward midline shift. Neuro exam grossly stable but she does appear more fatigued. Paged neurosurgeon Dr. Jake Samples who will plan to examine her today shortly. - Monitor neurologic exam, if changes in neuro status, call neurosurgery - Systolic blood pressure goal below 150 - Keppra 1000 mg PO Q12H with clonazepam 1 mg PO as rescue medication - Held home Seroquel 25mg  per NPO due to ongoing seizures, hold home Depakote 125mg  to avoid reduction of platelet levels in setting of progressive bleed - reevaluate at follow up      Dementia Returning to baseline. Functional decline began before her first fall and subdural hematoma in October.  Present baseline is disorientation to time, frequent redirection, occasional irritability, but able to converse as basic level. She is not able to perform her own ADLs.   PT/OT/SLP spoke with patient's spouse, who agreed with SNF placement for the patient due to increased need of assistance in ADLs and IADLs.  - Anticipate discharge to SNF - Held home memantine 10mg  daily, Rivastigmine 3mg  - reevaluate at follow up   HTN Goal SBP is below 150 per evolving subdural hematoma. Discontinued labetalol during hospitalization due to bradycardia, and avoided nitroprusside due to increased ICP. Increased home amlodipine to 10 mg and started patient on ARB given onset time of amlodipine..  - Home amlodipine increased to 10 mg daily - Irbesartan 300 mg daily    Thrombocytopenia  CBC at admission showed platelets 123. This is most likely due to patient's home Depakote. This medication was held during hospitalization. Platelets most recently 131.  - Held home Depakote - daily CBC  Depression/Anxiety -Held home sertraline 150mg  daily, hold home Wellbutrin 300mg  in setting of seizures. Reevaluate at follow up.    Hypothyroidism Resume at discharge synthroid daily   HLD Resume at discharge rosuvastatin 20mg  daily   Best Practice: Diet: NPO IVF: Fluids: None, Rate: None VTE: SCDs Start: 01/22/23 1400 Code: Full AB: None Therapy Recs:  N/a , DME: none  DISPO: Anticipated discharge  today or tomorrow  to Skilled nursing facility pending  SNF placement .  Signature: Katheran James, D.O.  Internal Medicine Resident, PGY-1 [facility]  Pager: [phone] 9:19 AM, 01/27/2023   Please contact the on call pager after 5 pm and on weekends at [phone].

## 2023-01-27 NOTE — Progress Notes (Addendum)
Patient off the unit for CT 1135: Patient back to the unit

## 2023-01-27 NOTE — Plan of Care (Signed)
  Problem: Education: Goal: Knowledge of General Education information will improve Description: Including pain rating scale, medication(s)/side effects and non-pharmacologic comfort measures Outcome: Progressing   Problem: Clinical Measurements: Goal: Respiratory complications will improve Outcome: Progressing Goal: Cardiovascular complication will be avoided Outcome: Progressing   Problem: Activity: Goal: Risk for activity intolerance will decrease Outcome: Progressing   Problem: Nutrition: Goal: Adequate nutrition will be maintained Outcome: Progressing   Problem: Elimination: Goal: Will not experience complications related to urinary retention Outcome: Progressing   Problem: Pain Management: Goal: General experience of comfort will improve Outcome: Progressing   Problem: Skin Integrity: Goal: Risk for impaired skin integrity will decrease Outcome: Progressing

## 2023-01-28 DIAGNOSIS — Z7189 Other specified counseling: Secondary | ICD-10-CM

## 2023-01-28 DIAGNOSIS — I10 Essential (primary) hypertension: Secondary | ICD-10-CM | POA: Diagnosis not present

## 2023-01-28 DIAGNOSIS — F0390 Unspecified dementia without behavioral disturbance: Secondary | ICD-10-CM | POA: Diagnosis not present

## 2023-01-28 DIAGNOSIS — Z515 Encounter for palliative care: Secondary | ICD-10-CM | POA: Diagnosis not present

## 2023-01-28 DIAGNOSIS — R569 Unspecified convulsions: Secondary | ICD-10-CM | POA: Diagnosis not present

## 2023-01-28 NOTE — Progress Notes (Signed)
HD#6 SUBJECTIVE:  Patient Summary: Anne [name] is a 72 yo patient with a history of dementia, HTN, HLD, hypothyroidism, depression, metastatic bone cancer and lung cancer who presented after seizure associated with subdural hematoma. Patient was admitted for seizure-like activity in the setting of evolving subdural hematoma.   Patient experienced an unwitnessed fall when patient attempted to leave her bed on 12/21 with a laceration of about 2.5 cm in length on her R posterior scalp s/p 2 stitches. Follow up CT imaging showed a mixed density subdural hematoma of the left cerebral hemisphere of increased thickness compared to previously identified subdural hematoma with an increased mass effect and partial effacement of the left lateral ventricle.   Overnight Events:  No overnight events reported.   Interim History:  Patient appears comfortable in bed this morning, no family at bedside. She appears to hear better on her left side. Able to shake/nod her head in response to simple questions. Denied acute pain.   OBJECTIVE:  Vital Signs: Vitals:   01/27/23 1508 01/27/23 1943 01/27/23 2344 01/28/23 0409  BP: (!) 147/81 (!) 143/84 133/82 (!) 147/88  Pulse: 78 80 79 76  Resp: 16 18 19 19   Temp: 97.6 F (36.4 C) 98.1 F (36.7 C) 97.6 F (36.4 C) 97.7 F (36.5 C)  TempSrc: Oral Axillary Oral Axillary  SpO2: 97% 94% 95% 94%  Weight:      Height:       Supplemental O2: Room Air SpO2: 94 % O2 Flow Rate (L/min): 2 L/min  Filed Weights   01/23/23 1417  Weight: 73.9 kg    Intake/Output Summary (Last 24 hours) at 01/28/2023 0658 Last data filed at 01/27/2023 2137 Gross per 24 hour  Intake 850 ml  Output --  Net 850 ml   Net IO Since Admission: 1,017 mL [01/28/23 0658]     Latest Ref Rng & Units 01/27/2023    7:57 AM 01/24/2023    6:06 AM 01/23/2023    5:29 AM  CBC  WBC 4.0 - 10.5 K/uL 8.0  6.3  5.8   Hemoglobin 12.0 - 15.0 g/dL 40.1  02.7  25.3   Hematocrit 36.0 - 46.0 %  44.7  40.9  40.3   Platelets 150 - 400 K/uL 203  131  148        Latest Ref Rng & Units 01/27/2023    7:57 AM 01/24/2023    6:06 AM 01/23/2023    5:29 AM  CMP  Glucose 70 - 99 mg/dL 664  79  87   BUN 8 - 23 mg/dL 24  22  20    Creatinine 0.44 - 1.00 mg/dL 4.03  4.74  2.59   Sodium 135 - 145 mmol/L 138  134  138   Potassium 3.5 - 5.1 mmol/L 3.9  3.7  3.7   Chloride 98 - 111 mmol/L 101  98  100   CO2 22 - 32 mmol/L 29  28  28    Calcium 8.9 - 10.3 mg/dL 9.5  9.0  9.2     Physical Exam: Physical Exam Constitutional:      General: She is not in acute distress.    Appearance: She is not ill-appearing.  HENT:     Head:     Comments: Patient with large, healing ecchymoses on left side of her face. Pulmonary:     Effort: Pulmonary effort is normal.  Musculoskeletal:     Right lower leg: No edema.     Left lower leg: No  edema.  Skin:    General: Skin is warm.  Neurological:     General: No focal deficit present.     Mental Status: She is alert. Mental status is at baseline.     Comments: Able to gesture simple responses to yes/no questions.     Patient Lines/Drains/Airways Status     Active Line/Drains/Airways     Name Placement date Placement time Site Days   Peripheral IV 01/26/23 22 G 1.16" Anterior;Right Forearm 01/26/23  0530  Forearm  1   Wound / Incision (Open or Dehisced) 10/29/18 Other (Comment) Leg Left;Posterior;Upper Boil 10/29/18  0058  Leg  1551   Wound / Incision (Open or Dehisced) 01/26/23 Laceration Head Posterior;Right;Upper laceration 01/26/23  1610  Head  1             ASSESSMENT/PLAN:  Assessment: Principal Problem:   Seizure (HCC) Active Problems:   Hypertension   Subdural hematoma [facility])  Patient Summary Anne [name] is a 72 yo with history of progressive dementia who presented to [facility] ED on the morning of 01/22/2023 after a seizure and was admitted for subdural hematoma with subsequent seizure and somnolence.    Plan: Subdural Hematoma  secondary to fall from standing with Seizures and Somnolence New fall from hospital bed causing rebleed, scalp laceration Dr. Jake Samples from neurosurgery facilitated a family meeting at bedside with patient's husband, 2 daughters, and some of her grandchildren. After their discussion, family decided against any neurosurgical intervention for her subdural hematoma and agreed to palliative care consult. Today, palliative care discussed options for discharge with family, with family opting for SNF given her level of alertness. Palliative to assist in finding a facility closer to [place]. Team was able to discuss code status with family (patient's husband, 1 daughter, 2 grandchildren, decided on DNR-limited.  - Continue PO Keppra 100 mg q12H with IV midazolam 2 mg PRN for rescue - Holding home Seroquel 25 mg and Depakote 125 mg       Dementia Seems to be stable at baseline. Functional decline began before her first fall and subdural hematoma in October. Not independent with ADLs. Patient awaiting placement to SNF where she can continue to work with therapy.  - Follow up with SW for SNF placement, appreciate their assistance - Holding home memantine 10 mg BID, rivastigmine 3 mg BID   HTN Goal SBP is below 150 per evolving subdural hematoma. Discontinued labetalol during hospitalization due to bradycardia, and avoided nitroprusside due to increased ICP. Increased home amlodipine to 10 mg and started patient on ARB.  - Continue amlodipine 10 mg daily - Irbesartan 300 mg daily  - IV hydralazine 2 mg q4H PRN if systolic > 150    Thrombocytopenia  CBC at admission showed platelets 123. This is most likely due to patient's home Depakote. This medication was held during hospitalization. Platelet count improving, 203 as of 12/22.  - Hold home Depakote - daily CBC    Depression/Anxiety -Held home sertraline 150mg  daily and home Wellbutrin 300 mg daily on admission in setting of seizures. Reevaluate at follow  up.    Hypothyroidism Resume synthroid daily at discharge.    HLD Resume rosuvastatin 20mg  daily at discharge.    Best Practice: Diet: DYS 2, thin fluids, Ensure feeding supplements IVF: None VTE: SCDs Start: 01/22/23 1400 Code: DNR- Limited  DISPO: Anticipated discharge pending SNF placement  Signature: Chosen Geske Colbert Coyer, MD Internal Medicine Resident, PGY-1 [facility]  6:58 AM, 01/28/2023  Please contact the on call pager after 5 pm and on weekends at [phone].

## 2023-01-28 NOTE — Consult Note (Signed)
Palliative Medicine Inpatient Consult Note  Consulting Provider: Dawley, Alan Mulder, DO   Reason for consult:   Palliative Care Consult Services Palliative Medicine Consult  Reason for Consult? goals of care discussion   01/28/2023  HPI:  Per intake H&P -->  Anne [name] is a 72 y.o. 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 She presented after a seizure. Patient has had extension of subdural hematoma. There is no surgical intervention to be pursued at this point per family wishes. The PMT has been asked to get involved to continue goals of care conversations.   Clinical Assessment/Goals of Care:  *Please note that this is a verbal dictation therefore any spelling or grammatical errors are due to the "Dragon Medical One" system interpretation.  I have reviewed medical records including EPIC notes, labs and imaging, received report from bedside RN, assessed the patient who is lying in bed aware of herself though not place or situation.    I met with patients spouse, [name], daughter, Marylene Land, and called her other daughter, [name] on speakerphone to further discuss diagnosis prognosis, GOC, EOL wishes, disposition and options.   I introduced Palliative Medicine as specialized medical care for people living with serious illness. It focuses on providing relief from the symptoms and stress of a serious illness. The goal is to improve quality of life for both the patient and the family.  Medical History Review and Understanding:  A review of patient's past medical history inclusive of her small cell lung cancer with metastasis to the bone, liver cirrhosis, hypothyroidism, moderate event dementia, hypertension, hyperlipidemia,  anemia, and depression was held.  Social History:  Anne [name] is from [place] [place] [facility].  She has been married to her husband [name] since 2005.  They have no children together though Anne [name] has 2 children from a previous marriage and [name] has 1 child from a previous marriage. 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  She gets enjoyment out of spending time with her family.  She is a woman of faith practicing within Christianity.  Functional and Nutritional State:  Preceding hospitalization, Anne [name] was living at home with her husband.  Her dementia had begun to get worse over the past few years therefore her husband left his job to be her primary/full-time caregiver.  He helped with things such as meal preparation.  Anne [name] was able to mobilize though she was quite unsteady and has experienced multiple falls. Anne [name] has had a fair appetite.  Advance Directives:  A detailed discussion was had today regarding advanced directives.  Patient's spouse [name] is her Runner, broadcasting/film/video.  There are no advanced directives filed.  Code Status:  Concepts specific to code status, artifical feeding and hydration, continued IV antibiotics and rehospitalization was had.  The difference between a aggressive medical intervention path  and a palliative comfort care path for this patient at this time was had.   Encouraged patient/family to consider DNR/DNI status understanding evidenced based poor outcomes in similar hospitalized patient, as the cause of arrest is likely associated with advanced chronic/terminal illness rather than an easily reversible acute cardio-pulmonary event. I explained that DNR/DNI does not change the medical plan and it only comes into effect after a person has arrested (died).  It is a protective measure to keep [place] from harming the patient in their last moments of life.   [name] shares that he struggles to make  this decision per patients daughters he is worried that he will be responsible for "putting Anne [name] down."   I was very open and honest with [name] about putting Ramata through a true resuscitative effort and the poor outcomes associated with such measures.   [name] shares that he will think more about this.   Provided  "Hard Choices for [employer]" booklet.   Discussion:  We reviewed the events leading to hospitalization inclusive of a fall on December 14 causing  a subdural hematoma.  Patient's family shared that she was cleared by the emergency department to go home.  After being at home, Anne [name] endured seizure activity leading to rehospitalization.    Anne [name] was in a good position per her family and getting ready to go to skilled nursing when she fell in the hospital causing worsening of her subdural hematoma.  Patient's family shared that she looked quite horrible yesterday and they were afraid that her time on earth was going to be extremely limited.  We reviewed how subdural hematoma's affect patients, especially those with dementia. I shared that Anne [name] may improve overtime but she also may worsen rather acutely. Patients family express understanding. They are encouraged by her ability to ambulate and eat today.   Harvin Hazel, RN was able to join the conversation later on. She discussed discharge planning. Patients family are open to SNF placement at this time with OP Palliative support. If Anne [name] neglects to thrive they are then open to hospice care. I described hospice as a service for patients who have a life expectancy of 6 months or less. The goal of hospice is the preservation of dignity and quality at the end phases of life. Under hospice care, the focus changes from curative to symptom relief.   Discussed the importance of continued conversation with family and their  medical providers regarding overall plan of care and treatment options, ensuring decisions are within the context of the patients  values and GOCs.  Decision Maker: Carolynne, Orban (Spouse): [phone] (Mobile)   SUMMARY OF RECOMMENDATIONS   Full code --> Provided hard choices book  Open and honest conversations held in the setting of patients subdural hematoma's  Plan for OP Palliative care follow up  Family is hopeful for improvements --> Allowing time for outcomes  Ongoing PMT support  Code Status/Advance Care Planning: FULL CODE  Palliative Prophylaxis:  Aspiration, Bowel Regimen, Delirium Protocol, Frequent Pain Assessment, Oral Care, Palliative Wound Care, and Turn Reposition  Additional Recommendations (Limitations, Scope, Preferences): Continue current care  Psycho-social/Spiritual:  Desire for further Chaplaincy support: Yes Additional Recommendations: Education on subdural hematomas and possible outcomes   Prognosis: Limited overall - metastatic cancer, multiple chronic co-morbidities, (+) large subdural hematoma.   Discharge Planning: Discharge to skilled nursing once medically optimized.   Vitals:   01/27/23 2344 01/28/23 0409  BP: 133/82 (!) 147/88  Pulse: 79 76  Resp: 19 19  Temp: 97.6 F (36.4 C) 97.7 F (36.5 C)  SpO2: 95% 94%    Intake/Output Summary (Last 24 hours) at 01/28/2023 0744 Last data filed at 01/27/2023 2137 Gross per 24 hour  Intake 850 ml  Output --  Net 850 ml   Last Weight  Most recent update: 01/23/2023  2:24 PM    Weight  73.9 kg (162 lb 14.7 oz)  Gen:  Elderly Caucasian F in NAD HEENT: moist mucous membranes CV: Regular rate and rhythm  PULM:  On RA, breathing is even and nonlabored ABD: soft/nontender  EXT: No edema  Neuro: Alert and oriented x1  PPS: 40%   This conversation/these recommendations were discussed with patient primary care team, Dr. Ninetta Lights  Billing based on MDM: High  Problems Addressed: One acute or chronic illness or injury that poses a threat to life or bodily function  Amount and/or Complexity of Data:  Category 3:Discussion of management or test interpretation with external physician/other qualified health care professional/appropriate source (not separately reported)  Risks: Decision regarding hospitalization or escalation of hospital care and Decision not to resuscitate or to de-escalate care because of poor prognosis ______________________________________________________ Lamarr Lulas [facility] Palliative Medicine Team Team Cell Phone: [phone] Please utilize secure chat with additional questions, if there is no response within 30 minutes please call the above phone number  Palliative Medicine Team providers are available by phone from 7am to 7pm daily and can be reached through the team cell phone.  Should this patient require assistance outside of these hours, please call the patient's attending physician.

## 2023-01-28 NOTE — Telephone Encounter (Signed)
Pt is still admitted and is actively being managed by [facility]surgery. Per discussion with Dr Katrinka Blazing, he is available for any questions, if needed.

## 2023-01-28 NOTE — Plan of Care (Signed)
  Problem: Clinical Measurements: Goal: Ability to maintain clinical measurements within normal limits will improve Outcome: Progressing Goal: Will remain free from infection Outcome: Progressing Goal: Diagnostic test results will improve Outcome: Progressing Goal: Respiratory complications will improve Outcome: Progressing Goal: Cardiovascular complication will be avoided Outcome: Progressing   Problem: Activity: Goal: Risk for activity intolerance will decrease Outcome: Progressing   Problem: Coping: Goal: Level of anxiety will decrease Outcome: Progressing   Problem: Pain Management: Goal: General experience of comfort will improve Outcome: Progressing   Problem: Safety: Goal: Ability to remain free from injury will improve Outcome: Progressing   Problem: Skin Integrity: Goal: Risk for impaired skin integrity will decrease Outcome: Progressing   Problem: Elimination: Goal: Will not experience complications related to bowel motility Outcome: Progressing Goal: Will not experience complications related to urinary retention Outcome: Progressing

## 2023-01-28 NOTE — Progress Notes (Signed)
Physical Therapy Treatment Patient Details Name: LUWANA ELIOTT MRN: [id number] DOB: 09-Jun-1950 Today's Date: 01/28/2023   History of Present Illness Patient is a 72 year old female with new onset of seizures in the setting of underlying SDH. Recent diagnosis of subdural hematoma with CT reporting increase in her previous subdural hematoma with left shift.  Fall in hospital with incr (again) R SDH. No change in neuro status and MD/family decided against any surgery. History of dementia, lung cancer, seizures, hypothyroid.    PT Comments  Patient alert on arrival. Cooperative, not impulsive (although noted recent fall due to trying to get up alone). Ambulated without RW initially as this is her norm, however with severe shuffling gait and reaching out for UE support. Seated rest and utilized RW for another bout of ambulation with improved balance and less assist required (from mod to min assist). Returned to bed for safety with seizure pads, 4 rails raised, bed alarm, and floor mats.     If plan is discharge home, recommend the following: A lot of help with walking and/or transfers;A lot of help with bathing/dressing/bathroom;Supervision due to cognitive status;Help with stairs or ramp for entrance;Assist for transportation;Direct supervision/assist for medications management;Direct supervision/assist for financial management;Assistance with cooking/housework   Can travel by private vehicle     No  Equipment Recommendations  None recommended by PT    Recommendations for Other Services       Precautions / Restrictions Precautions Precautions: Fall Precaution Comments: Systolic blood pressure goal below 150 Restrictions Weight Bearing Restrictions Per Provider Order: No     Mobility  Bed Mobility Overal bed mobility: Needs Assistance Bed Mobility: Supine to Sit, Sit to Supine     Supine to sit: Contact guard Sit to supine: Contact guard assist   General bed mobility comments: cues  for initiation only    Transfers Overall transfer level: Needs assistance Equipment used: 1 person hand held assist, Rolling walker (2 wheels) Transfers: Sit to/from Stand Sit to Stand: Min assist           General transfer comment: steadying assistance required with standing due to posterior bias    Ambulation/Gait Ambulation/Gait assistance: Mod assist, Min assist Gait Distance (Feet): 30 Feet (+1 HHA; 30 ft RW) Assistive device: 1 person hand held assist, Rolling walker (2 wheels) Gait Pattern/deviations: Step-through pattern, Decreased step length - right, Decreased step length - left, Shuffle Gait velocity: decreased     General Gait Details: initially with HHA with very slow pace and short step length (shuffling); pt reaching for support with other UE; with RW still with decr step lenght, but slightly improved clearance bil feet and velocity incr; slight posterior imbalance with turning 180   Stairs             Wheelchair Mobility     Tilt Bed    Modified Rankin (Stroke Patients Only)       Balance Overall balance assessment: Needs assistance, History of Falls Sitting-balance support: Feet supported Sitting balance-Leahy Scale: Poor       Standing balance-Leahy Scale: Poor                              Cognition Arousal: Alert Behavior During Therapy: WFL for tasks assessed/performed Overall Cognitive Status: Impaired/Different from baseline  General Comments: history of cognitive impairment at baseline. patient is oriented to self only today; with choices she knows she is in a hospital. she needs increased time and multi modal cues to follow single step commands.        Exercises      General Comments General comments (skin integrity, edema, etc.): on room air with sats 97% HR 115 after ambulation      Pertinent Vitals/Pain Pain Assessment Pain Assessment: Faces Faces Pain Scale:  No hurt    Home Living                          Prior Function            PT Goals (current goals can now be found in the care plan section) Acute Rehab PT Goals Patient Stated Goal: daughter prefers rehab if insurance will cover the cost Time For Goal Achievement: 02/07/23 Potential to Achieve Goals: Fair Progress towards PT goals: Not progressing toward goals - comment (recent fall with slight regression)    Frequency    Min 1X/week      PT Plan      Co-evaluation              AM-PAC PT "6 Clicks" Mobility   Outcome Measure  Help needed turning from your back to your side while in a flat bed without using bedrails?: A Little Help needed moving from lying on your back to sitting on the side of a flat bed without using bedrails?: A Little Help needed moving to and from a bed to a chair (including a wheelchair)?: A Lot Help needed standing up from a chair using your arms (e.g., wheelchair or bedside chair)?: A Lot Help needed to walk in hospital room?: A Lot Help needed climbing 3-5 steps with a railing? : Total 6 Click Score: 13    End of Session Equipment Utilized During Treatment: Gait belt Activity Tolerance: Patient tolerated treatment well Patient left: in bed;with call bell/phone within reach;with bed alarm set (telesitter) Nurse Communication: Mobility status PT Visit Diagnosis: Other abnormalities of gait and mobility (R26.89);Difficulty in walking, not elsewhere classified (R26.2)     Time: 9485-4627 PT Time Calculation (min) (ACUTE ONLY): 17 min  Charges:    $Gait Training: 8-22 mins PT General Charges $$ ACUTE PT VISIT: 1 Visit                      Jerolyn Center, PT Acute Rehabilitation Services  Office [phone]    Zena Amos 01/28/2023, 9:41 AM

## 2023-01-28 NOTE — TOC Progression Note (Signed)
Transition of Care [facility]) - Progression Note    Patient Details  Name: Anne [name] MRN: [id number] Date of Birth: March 05, 1950  [facility]) CM/SW Contact  Kermit Balo, RN Phone Number: 01/28/2023, 11:19 AM  Clinical Narrative:     CM provide the family the choice for SNF rehab and they selected [facility]. Tanya with [facility] verified the bed. CM has asked [facility] to begin insurance auth for a rehab stay. TOC following.  Expected Discharge Plan: Skilled Nursing Facility Barriers to Discharge: Insurance Authorization, Other (must enter comment) (need snf choice)  Expected Discharge Plan and Services In-house Referral: Clinical Social Work   Post Acute Care Choice: Skilled Nursing Facility Living arrangements for the past 2 months: Single Family Home                                       Social Determinants of Health (SDOH) Interventions SDOH Screenings   Food Insecurity: Patient Unable To Answer (01/22/2023)  Housing: Patient Unable To Answer (01/22/2023)  Transportation Needs: Patient Unable To Answer (01/22/2023)  Utilities: Patient Unable To Answer (01/22/2023)  Depression (PHQ2-9): High Risk (06/21/2022)  Financial Resource Strain: Low Risk  (09/08/2020)  Physical Activity: Unknown (10/29/2018)  Social Connections: Unknown (09/08/2020)  Stress: No Stress Concern Present (09/08/2020)  Tobacco Use: Medium Risk (01/22/2023)    Readmission Risk Interventions     No data to display

## 2023-01-29 ENCOUNTER — Encounter: Payer: Self-pay | Admitting: Neurosurgery

## 2023-01-29 ENCOUNTER — Telehealth: Payer: Self-pay | Admitting: Neurosurgery

## 2023-01-29 LAB — CBC
HCT: 44 % (ref 36.0–46.0)
Hemoglobin: 14.5 g/dL (ref 12.0–15.0)
MCH: 29.9 pg (ref 26.0–34.0)
MCHC: 33 g/dL (ref 30.0–36.0)
MCV: 90.7 fL (ref 80.0–100.0)
Platelets: 161 10*3/uL (ref 150–400)
RBC: 4.85 MIL/uL (ref 3.87–5.11)
RDW: 14.9 % (ref 11.5–15.5)
WBC: 8.2 10*3/uL (ref 4.0–10.5)
nRBC: 0 % (ref 0.0–0.2)

## 2023-01-29 LAB — BASIC METABOLIC PANEL
Anion gap: 12 (ref 5–15)
BUN: 30 mg/dL — ABNORMAL HIGH (ref 8–23)
CO2: 24 mmol/L (ref 22–32)
Calcium: 9.6 mg/dL (ref 8.9–10.3)
Chloride: 99 mmol/L (ref 98–111)
Creatinine, Ser: 0.77 mg/dL (ref 0.44–1.00)
GFR, Estimated: >60 mL/min (ref >60)
Glucose, Bld: 110 mg/dL — ABNORMAL HIGH (ref 70–99)
Potassium: 4.2 mmol/L (ref 3.5–5.1)
Sodium: 135 mmol/L (ref 135–145)

## 2023-01-29 NOTE — Telephone Encounter (Signed)
I received a phone call from Gerre Couch, Ms. Bertoni's husband.  They are currently inpatient at [facility].  They have previously see me in clinic and were just reaching out to ask for advice on their current hospitalization.  Of note she has had more falls and had seizures and was transferred to [facility] for seizure evaluation with long-term monitoring.  She had another recent fall and had a repeat head CT on 12/22.  She had a repeat neurosurgery consultation at that time.  The read and images are pasted below  arrative & Impression  CLINICAL DATA:  Provided history: Subdural hematoma.   EXAM: CT HEAD WITHOUT CONTRAST   TECHNIQUE: Contiguous axial images were obtained from the base of the skull through the vertex without intravenous contrast.   RADIATION DOSE REDUCTION: This exam was performed according to the departmental dose-optimization program which includes automated exposure control, adjustment of the mA and/or kV according to patient size and/or use of iterative reconstruction technique.   COMPARISON:  Prior head CT examinations 01/26/2023 and earlier.   FINDINGS: Brain:   Continued interval increase in size of a mixed density subdural hematoma (containing acute/subacute components) overlying the left cerebral hemisphere, now measuring up to 17 mm in thickness (previously 14 mm, remeasured on prior). Mass effect has also increased, now with 8 mm rightward midline shift (previously 6 mm). Partial effacement of the left lateral ventricle.   Trace acute/subacute subdural hemorrhage along the anterior falx and along the anteroinferior right frontal lobe, unchanged.   No demarcated cortical infarct.   No evidence of an intracranial mass.   Vascular: No hyperdense vessel.  Atherosclerotic calcifications.   Skull: No calvarial fracture or aggressive osseous lesion.   Sinuses/Orbits: No mass or acute finding within the imaged orbits. 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 No mucosal thickening within a left ethmoid air cell.   Other: Persistent left anterior scalp hematoma.   Impressions #1 will be called to the ordering clinician or representative by the Radiologist Assistant, and communication documented in the PACS or [employer].   IMPRESSION: 1. Continued interval increase in size of a mixed density subdural hematoma overlying the left cerebral hemisphere (containing acute/subacute components), now measuring up to 17 mm in thickness (previously 14 mm). Mass effect has also increased, now with 8 mm rightward midline shift (previously 6 mm). 2. Trace acute/subacute subdural hemorrhage along the anteroinferior right frontal lobe, unchanged.     Electronically Signed   By: Jackey Loge D.O.   On: 01/27/2023 11:56     I explained to him that with the acuteness of the blood that I agree with Dr. Jake Samples that this would require a craniotomy for evacuation.  He agree that she would not want such a large surgery given her dementia and overall health status.  I let him know that I would be happy to see her in clinic in 1 to 2 weeks with a repeat head CT to evaluate for any signs of liquefaction of the hematoma as this can sometimes make it amenable to bur hole drainage which would be a much smaller intervention for her.  However, we would still need to ensure that this would meet her goals of care.  They are currently planning on discharging to a rehab care facility for her recovery, he would like to come in have her see me in 2 weeks with a repeat head CT just to evaluate for any worsening or liquefaction and so that  we can have a further conversation about her issues.  I did let him know that recovery from even a small surgery would be quite guarded and often is associated with complications during the hospitalization including seizures, rebleeds, worsening mentation, and need for further surgery.  Will plan on offering a follow-up  in 2 weeks with repeat head CT.  I remain available for any further discussions

## 2023-01-29 NOTE — Progress Notes (Signed)
Occupational Therapy Treatment Patient Details Name: Anne [name] MRN: [id number] DOB: 10/23/1950 Today's Date: 01/29/2023   History of present illness Patient is a 72 year old female with new onset of seizures in the setting of underlying SDH. Recent diagnosis of subdural hematoma with CT reporting increase in her previous subdural hematoma with left shift.  Fall in hospital with incr (again) R SDH. No change in neuro status and MD/family decided against any surgery. History of dementia, lung cancer, seizures, hypothyroid.   OT comments  Pt making slow progression towards goals this session, performing seated ADLs with min A - mod A, spouse present and supportive, pt needs incr time and cues to initiate BADL/mobility tasks. Pt needing mod A for bed mobility and for transfer to chair with RW. BP 116/79 (89) and SpO2 93% on RA at end of session. Pt presenting with impairments listed below, will follow acutely. Patient will benefit from continued inpatient follow up therapy, <3 hours/day to maximize safety/ind with ADL/functional mobility.       If plan is discharge home, recommend the following:  A lot of help with walking and/or transfers;A lot of help with bathing/dressing/bathroom   Equipment Recommendations  BSC/3in1    Recommendations for Other Services      Precautions / Restrictions Precautions Precautions: Fall Precaution Comments: Systolic blood pressure goal below 150 Restrictions Weight Bearing Restrictions Per Provider Order: No       Mobility Bed Mobility Overal bed mobility: Needs Assistance Bed Mobility: Supine to Sit, Sit to Supine     Supine to sit: Mod assist     General bed mobility comments: assist for initiation, assist to prevent posterior lean once EOB    Transfers Overall transfer level: Needs assistance Equipment used: Rolling walker (2 wheels) Transfers: Sit to/from Stand Sit to Stand: Mod assist                 Balance Overall balance  assessment: Needs assistance, History of Falls Sitting-balance support: Feet supported Sitting balance-Leahy Scale: Poor Sitting balance - Comments: posterior lean     Standing balance-Leahy Scale: Poor Standing balance comment: relaint on external support                           ADL either performed or assessed with clinical judgement   ADL Overall ADL's : Needs assistance/impaired     Grooming: Sitting;Minimal assistance           Upper Body Dressing : Minimal assistance;Standing Upper Body Dressing Details (indicate cue type and reason): donning gown     Toilet Transfer: Moderate assistance;Stand-pivot           Functional mobility during ADLs: Moderate assistance;+2 for physical assistance;Rolling walker (2 wheels)      Extremity/Trunk Assessment Upper Extremity Assessment Upper Extremity Assessment: Generalized weakness   Lower Extremity Assessment Lower Extremity Assessment: Defer to PT evaluation        Vision       Perception Perception Perception: Not tested   Praxis Praxis Praxis: Not tested    Cognition Arousal: Lethargic Behavior During Therapy: [facility] for tasks assessed/performed Overall Cognitive Status: Impaired/Different from baseline                                 General Comments: Minimal verbalizations        Exercises      Shoulder Instructions  General Comments SpO2 93% on RA, and BP 116/79 (89)    Pertinent Vitals/ Pain       Pain Assessment Pain Assessment: No/denies pain  Home Living                                          Prior Functioning/Environment              Frequency  Min 1X/week        Progress Toward Goals  OT Goals(current goals can now be found in the care plan section)  Progress towards OT goals: Progressing toward goals  Acute Rehab OT Goals Patient Stated Goal: none stated OT Goal Formulation: Patient unable to participate in goal  setting Time For Goal Achievement: 02/07/23 Potential to Achieve Goals: Good ADL Goals Pt Will Perform Grooming: with set-up;with supervision;sitting Pt Will Perform Upper Body Dressing: with min assist;sitting Pt Will Transfer to Toilet: with contact guard assist;bedside commode;ambulating Pt Will Perform Toileting - Clothing Manipulation and hygiene: with min assist;sit to/from stand Additional ADL Goal #1: Pt will demonstrate sustained attention during ADLs with Min cues  Plan      Co-evaluation                 AM-PAC OT "6 Clicks" Daily Activity     Outcome Measure   Help from another person eating meals?: A Little Help from another person taking care of personal grooming?: A Little Help from another person toileting, which includes using toliet, bedpan, or urinal?: A Lot Help from another person bathing (including washing, rinsing, drying)?: A Lot Help from another person to put on and taking off regular upper body clothing?: A Lot Help from another person to put on and taking off regular lower body clothing?: A Lot 6 Click Score: 14    End of Session Equipment Utilized During Treatment: Gait belt;Rolling walker (2 wheels)  OT Visit Diagnosis: Unsteadiness on feet (R26.81);Other abnormalities of gait and mobility (R26.89);Muscle weakness (generalized) (M62.81)   Activity Tolerance Patient tolerated treatment well   Patient Left in chair;with call bell/phone within reach;with chair alarm set;with family/visitor present   Nurse Communication Mobility status        Time: 0737-1062 OT Time Calculation (min): 32 min  Charges: OT General Charges $OT Visit: 1 Visit OT Treatments $Self Care/Home Management : 23-37 mins  Anne [name], OTD, OTR/L SecureChat Preferred Acute Rehab [phone]   Anne [name] 01/29/2023, 12:25 PM

## 2023-01-29 NOTE — TOC Progression Note (Signed)
Transition of Care [facility]) - Progression Note    Patient Details  Name: Anne [name] MRN: [id number] Date of Birth: 1950-08-01  Transition of Care [facility]) CM/SW Contact  Gordy Clement, RN Phone Number: 01/29/2023, 10:41 AM  Clinical Narrative:     RNCM recevied call from [employer] insurance  Patient has approval for one way ambulance ride [facility] # [phone])  For SNF request they are offering a Peer to Peer with Elnita Maxwell @ [phone]   CSW and providers notified via secure chat    Expected Discharge Plan: Skilled Nursing Facility Barriers to Discharge: Insurance Authorization, Other (must enter comment) (need snf choice)  Expected Discharge Plan and Services In-house Referral: Clinical Social Work   Post Acute Care Choice: Skilled Nursing Facility Living arrangements for the past 2 months: Single Family Home                                       Social Determinants of Health (SDOH) Interventions SDOH Screenings   Food Insecurity: Patient Unable To Answer (01/22/2023)  Housing: Patient Unable To Answer (01/22/2023)  Transportation Needs: Patient Unable To Answer (01/22/2023)  Utilities: Patient Unable To Answer (01/22/2023)  Depression (PHQ2-9): High Risk (06/21/2022)  Financial Resource Strain: Low Risk  (09/08/2020)  Physical Activity: Unknown (10/29/2018)  Social Connections: Unknown (09/08/2020)  Stress: No Stress Concern Present (09/08/2020)  Tobacco Use: Medium Risk (01/22/2023)    Readmission Risk Interventions     No data to display

## 2023-01-29 NOTE — Progress Notes (Signed)
HD#7 SUBJECTIVE:  Patient Summary: Anne [name] is a 72 yo patient with a history of dementia, HTN, HLD, hypothyroidism, depression, metastatic bone cancer and lung cancer who presented after seizure associated with subdural hematoma. Patient was admitted for seizure-like activity in the setting of evolving subdural hematoma.   Patient experienced an unwitnessed fall when patient attempted to leave her bed on 12/21 with a laceration of about 2.5 cm in length on her R posterior scalp s/p 2 stitches. Follow up CT imaging showed a mixed density subdural hematoma of the left cerebral hemisphere of increased thickness compared to previously identified subdural hematoma with an increased mass effect and partial effacement of the left lateral ventricle.   Overnight Events:  No overnight events reported.   Interim History:  Patient is awake, resting comfortably in bed. No family at bedside this morning. Patient is alert and responding to simple questions with gestures and/or vocalizations. She attempted to answer her full name when prompted. Denies any pain this morning including headache or pain along the left side of her face. She denied BM this morning. Breakfast tray appears untouched.   OBJECTIVE:  Vital Signs: Vitals:   01/29/23 0025 01/29/23 0415 01/29/23 0730 01/29/23 0810  BP: 122/80 (!) 144/86 129/76 129/76  Pulse: 95 89 84   Resp: 18 16 16    Temp: 97.6 F (36.4 C) 97.9 F (36.6 C) 97.6 F (36.4 C)   TempSrc: Oral Oral Oral   SpO2: 93% 90% 92%   Weight:      Height:       Supplemental O2: Room Air SpO2: 92 % O2 Flow Rate (L/min): 2 L/min  Filed Weights   01/23/23 1417  Weight: 73.9 kg   No intake or output data in the 24 hours ending 01/29/23 0921  Net IO Since Admission: 1,257 mL [01/29/23 0921]     Latest Ref Rng & Units 01/29/2023    4:45 AM 01/27/2023    7:57 AM 01/24/2023    6:06 AM  CBC  WBC 4.0 - 10.5 K/uL 8.2  8.0  6.3   Hemoglobin 12.0 - 15.0 g/dL 29.5   28.4  13.2   Hematocrit 36.0 - 46.0 % 44.0  44.7  40.9   Platelets 150 - 400 K/uL 161  203  131        Latest Ref Rng & Units 01/29/2023    4:45 AM 01/27/2023    7:57 AM 01/24/2023    6:06 AM  CMP  Glucose 70 - 99 mg/dL 440  102  79   BUN 8 - 23 mg/dL 30  24  22    Creatinine 0.44 - 1.00 mg/dL 7.25  3.66  4.40   Sodium 135 - 145 mmol/L 135  138  134   Potassium 3.5 - 5.1 mmol/L 4.2  3.9  3.7   Chloride 98 - 111 mmol/L 99  101  98   CO2 22 - 32 mmol/L 24  29  28    Calcium 8.9 - 10.3 mg/dL 9.6  9.5  9.0     Physical Exam: Physical Exam Constitutional:      General: She is not in acute distress.    Appearance: She is not ill-appearing.  HENT:     Head:     Comments: Healing ecchymoses on left side of her face stable. Pulmonary:     Effort: Pulmonary effort is normal.  Abdominal:     General: Abdomen is flat.     Palpations: Abdomen is soft.  Musculoskeletal:     Right lower leg: No edema.     Left lower leg: No edema.  Skin:    General: Skin is warm.  Neurological:     General: No focal deficit present.     Mental Status: She is alert. Mental status is at baseline.     Comments: Continues to gesture and/or vocalize to answer simple questions. Attempted to state her name.     Patient Lines/Drains/Airways Status     Active Line/Drains/Airways     Name Placement date Placement time Site Days   Peripheral IV 01/26/23 22 G 1.16" Anterior;Right Forearm 01/26/23  0530  Forearm  1   Wound / Incision (Open or Dehisced) 10/29/18 Other (Comment) Leg Left;Posterior;Upper Boil 10/29/18  0058  Leg  1551   Wound / Incision (Open or Dehisced) 01/26/23 Laceration Head Posterior;Right;Upper laceration 01/26/23  1610  Head  1             ASSESSMENT/PLAN:  Assessment: Principal Problem:   Seizure (HCC) Active Problems:   Hypertension   Subdural hematoma [facility])  Patient Summary Anne [name] is a 72 yo with history of progressive dementia who presented to [facility] ED on the  morning of 01/22/2023 after a seizure and was admitted for subdural hematoma with subsequent seizure and somnolence.    Plan: Subdural Hematoma secondary to fall from standing with Seizures and Somnolence New fall from hospital bed causing rebleed, scalp laceration Patient denies pain or any new complaint this morning, appears to be alert and is answering simple yes/no questions. Following team's discussion with family yesterday (patient, patient's husband, daughter, two grandchildren), SW is working on SNF placement near [employer] Will touch base with SW regarding any updates.  - Continue PO Keppra 100 mg q12H with IV midazolam 2 mg PRN for rescue      Dementia Seems to be at baseline this morning. Will continue to avoid centrally acting medications to minimize risk of confusion.  - Follow up with SW for SNF placement, appreciate their assistance - Holding home memantine 10 mg BID, rivastigmine 3 mg BID, Depakote 375 mg TID, Seroquel 25 mg daily at bedtime   HTN Goal SBP is below 150 per evolving subdural hematoma. Discontinued labetalol during hospitalization due to bradycardia, and avoided nitroprusside due to increased ICP. Increased home amlodipine to 10 mg and started patient on ARB.  - Continue amlodipine 10 mg daily, Irbesartan 300 mg daily  - IV hydralazine 2 mg q4H PRN if systolic > 150    Thrombocytopenia- resolved CBC at admission showed platelets 123. On home Depakote, which was held on admission. No  Platelet count stable, 161 today.  - Monitor CBC    Depression/Anxiety -Held home sertraline 150mg  daily and home Wellbutrin 300 mg daily on admission in setting of seizures. Reevaluate at follow up.    Hypothyroidism Resume synthroid daily at discharge.    HLD Resume rosuvastatin 20mg  daily at discharge.    Best Practice: Diet: DYS 2, thin fluids, Ensure feeding supplements IVF: None VTE: SCDs Start: 01/22/23 1400 Code: DNR- Limited  DISPO: Anticipated discharge  pending SNF placement  Signature: Aceton Kinnear Colbert Coyer, MD Internal Medicine Resident, PGY-1 [facility]  9:21 AM, 01/29/2023   Please contact the on call pager after 5 pm and on weekends at [phone].

## 2023-01-29 NOTE — Progress Notes (Signed)
Speech Language Pathology Treatment: Dysphagia;Cognitive-Linquistic  Patient Details Name: Anne [name] MRN: [id number] DOB: May 15, 1950 Today's Date: 01/29/2023 Time: 1010-1024 SLP Time Calculation (min) (ACUTE ONLY): 14 min  Assessment / Plan / Recommendation Clinical Impression  Pt quite lethargic during meal, prolonged mastication, needed liquid washes to clear. Able to hold spoon or cup and feed herself with cues, but doesn't sustain attention to fully complete tasks and eventually total assisted feeding given to maximize oral intake. Still took about 20% of a large meal. No potential for texture upgrade right now. Continue dys2, thin. F/u with SLP at SNF level. Will sign off acutely.   HPI HPI: Anne [name] is a 72 y.o. with a pertinent PMH of recently diagnosed subdural hematoma, seizure disorder, progressive dementia, hypertension, and hypothyroidism who presented after a seizure. In the ED patient had another tonic clonic seizure and CT showed increase in her previous subdural hematoma with left shift. Patient was admitted for seizure-like activity in the setting of evolving subdural hematoma.      SLP Plan  Discharge SLP treatment due to (comment)      Recommendations for follow up therapy are one component of a multi-disciplinary discharge planning process, led by the attending physician.  Recommendations may be updated based on patient status, additional functional criteria and insurance authorization.    Recommendations  Diet recommendations: Dysphagia 2 (fine chop);Thin liquid Liquids provided via: Straw Medication Administration: Whole meds with puree Supervision: Full supervision/cueing for compensatory strategies Compensations: Slow rate;Small sips/bites                              Discharge SLP treatment due to (comment)     Juli Odom, [name]  01/29/2023, 10:47 AM

## 2023-01-29 NOTE — Progress Notes (Signed)
Report and Transfer of care to [facility], RN.  Pt transferred to room 3W-18.  No complaints of pain or discomfort.  All pt belongings and tele sitter to room 3W-18.  Tele sitter made aware of the room change.

## 2023-01-29 NOTE — Progress Notes (Signed)
This chaplain responded to RN-Jinata consult for spiritual care. The chaplain understands the family is requesting a visit. The chaplain reviewed the chart notes and received an update from RN-Shirlene on the Pt. care.  The Pt. is sleeping at the time of the visit. The chaplain understands the Pt. husband recently stepped away from the bedside.   This chaplain will plan a revisit when family is at the bedside. Spiritual care during the holidays can be requested at [phone]. This chaplain will F/U on Friday.  Chaplain Stephanie Acre [phone]

## 2023-01-29 NOTE — Plan of Care (Signed)

## 2023-01-30 DIAGNOSIS — R569 Unspecified convulsions: Secondary | ICD-10-CM | POA: Diagnosis not present

## 2023-01-30 DIAGNOSIS — F0390 Unspecified dementia without behavioral disturbance: Secondary | ICD-10-CM | POA: Diagnosis not present

## 2023-01-30 DIAGNOSIS — I10 Essential (primary) hypertension: Secondary | ICD-10-CM | POA: Diagnosis not present

## 2023-01-30 NOTE — Progress Notes (Signed)
HD#8 SUBJECTIVE:  Patient Summary: Anne [name] is a 72 yo patient with a history of dementia, HTN, HLD, hypothyroidism, depression, metastatic bone cancer and lung cancer who presented after seizure associated with subdural hematoma. Patient was admitted for seizure-like activity in the setting of evolving subdural hematoma.   Patient experienced an unwitnessed fall when patient attempted to leave her bed on 12/21 with a laceration of about 2.5 cm in length on her R posterior scalp s/p 2 stitches. Follow up CT imaging showed a mixed density subdural hematoma of the left cerebral hemisphere of increased thickness compared to previously identified subdural hematoma with an increased mass effect and partial effacement of the left lateral ventricle.   Overnight Events:  Morning nurse reports patient attempted to get out of bed overnight.   Interim History:  Patient was asleep, resting in bed in no acute distress. No family at bedside. Patient was slow to wake, did open her eyes and nod/shake her head in response to simple questions. Attempted to follow simple commands but appeared to be weak. Nodded her head when asked if she was feeling tired.   Spoke to patient's nurse, who provided feeding assistance with breakfast. Will check in on patient later this morning/afternoon to assess alertness.   OBJECTIVE:  Vital Signs: Vitals:   01/29/23 2057 01/29/23 2349 01/30/23 0516 01/30/23 0817  BP: 108/77 121/80 112/78 124/82  Pulse: 95 97 86 84  Resp: 18 17 17 17   Temp: (!) 97.5 F (36.4 C) 97.7 F (36.5 C) 97.6 F (36.4 C) 97.6 F (36.4 C)  TempSrc: Oral Oral Oral Axillary  SpO2: 93% 91% 93% 92%  Weight:      Height:       Supplemental O2: Room Air SpO2: 92 % O2 Flow Rate (L/min): 2 L/min  Filed Weights   01/23/23 1417  Weight: 73.9 kg    Intake/Output Summary (Last 24 hours) at 01/30/2023 1002 Last data filed at 01/29/2023 2000 Gross per 24 hour  Intake 240 ml  Output 350 ml   Net -110 ml    Net IO Since Admission: 1,147 mL [01/30/23 1002]     Latest Ref Rng & Units 01/29/2023    4:45 AM 01/27/2023    7:57 AM 01/24/2023    6:06 AM  CBC  WBC 4.0 - 10.5 K/uL 8.2  8.0  6.3   Hemoglobin 12.0 - 15.0 g/dL 16.1  09.6  04.5   Hematocrit 36.0 - 46.0 % 44.0  44.7  40.9   Platelets 150 - 400 K/uL 161  203  131        Latest Ref Rng & Units 01/29/2023    4:45 AM 01/27/2023    7:57 AM 01/24/2023    6:06 AM  CMP  Glucose 70 - 99 mg/dL 409  811  79   BUN 8 - 23 mg/dL 30  24  22    Creatinine 0.44 - 1.00 mg/dL 9.14  7.82  9.56   Sodium 135 - 145 mmol/L 135  138  134   Potassium 3.5 - 5.1 mmol/L 4.2  3.9  3.7   Chloride 98 - 111 mmol/L 99  101  98   CO2 22 - 32 mmol/L 24  29  28    Calcium 8.9 - 10.3 mg/dL 9.6  9.5  9.0     Physical Exam: Physical Exam Constitutional:      General: She is not in acute distress.    Appearance: She is not ill-appearing.  HENT:  Head:     Comments: Ecchymoses on left side of face improving, still tender to palpation. Pulmonary:     Effort: Pulmonary effort is normal.  Abdominal:     General: Abdomen is flat.     Palpations: Abdomen is soft.  Musculoskeletal:     Right lower leg: No edema.     Left lower leg: No edema.  Skin:    General: Skin is warm.  Neurological:     Comments: Appears to be less alert this morning but still attempting to gesture to answer simple questions. Did not attempt to vocalize any answers. Attempted to follow simple commands to wiggle her toes and move her legs and arms. She appeared to be more successful wiggling toes on left side. Minimal movement otherwise.       Patient Lines/Drains/Airways Status     Active Line/Drains/Airways     Name Placement date Placement time Site Days   Peripheral IV 01/26/23 22 G 1.16" Anterior;Right Forearm 01/26/23  0530  Forearm  1   Wound / Incision (Open or Dehisced) 10/29/18 Other (Comment) Leg Left;Posterior;Upper Boil 10/29/18  0058  Leg  1551    Wound / Incision (Open or Dehisced) 01/26/23 Laceration Head Posterior;Right;Upper laceration 01/26/23  1610  Head  1             ASSESSMENT/PLAN:  Assessment: Principal Problem:   Seizure (HCC) Active Problems:   Hypertension   Subdural hematoma [facility])  Patient Summary Anne [name] is a 72 yo with history of progressive dementia who presented to [facility] ED on the morning of 01/22/2023 after a seizure and was admitted for subdural hematoma with subsequent seizure and somnolence.    Plan: Subdural Hematoma secondary to fall from standing with Seizures and Somnolence New fall from hospital bed causing rebleed, scalp laceration Patient less alert this morning but able to shake her head in response to acute pain, nodded her head when asked if still tender on left side of her face on palpation. Medical team attempted to call Elnita Maxwell with patient's insurance at [phone] and left two voicemails for follow up on peer-to-peer due to initial denial for SNF placement. SW still assisting, also left voicemail with insurance company for a call back today.  - Late morning/afternoon check-in to assess alertness - Continue PO Keppra 100 mg q12H with IV midazolam 2 mg PRN for rescue      Dementia Patient appears to be experiencing intermittent delirium with possible increased agitation towards evening/night time resulting in her wanting to get out of bed. Have been holding home medications that are centrally acting in the setting of subdural hematoma/seizure. Recommend family presence, especially in the evenings, to help to orient patient and minimize confusion while she waits for SNF placement.  - Follow up with insurance company and SW for SNF placement, appreciate their assistance - Holding home memantine 10 mg BID, rivastigmine 3 mg BID, Depakote 375 mg TID, Seroquel 25 mg daily at bedtime   HTN Goal SBP is below 150 per evolving subdural hematoma. Discontinued labetalol during hospitalization due  to bradycardia, and avoided nitroprusside due to increased ICP. Increased home amlodipine to 10 mg and started patient on ARB. BP have been 100s-120s/70s-80s.  - Continue amlodipine 10 mg daily, Irbesartan 300 mg daily  - IV hydralazine 2 mg q4H PRN if systolic > 150    Thrombocytopenia- resolved CBC at admission showed platelets 123. On home Depakote, which was held on admission. No  Platelet count stable at 161  12/24. Lab holiday today.  - Monitor CBC    Depression/Anxiety -Held home sertraline 150 mg daily and home Wellbutrin 300 mg daily on admission in setting of seizures. Reevaluate at follow up.    Hypothyroidism Resume synthroid 88 mcg daily at discharge.    HLD Resume rosuvastatin 20 mg daily at discharge.    Best Practice: Diet: DYS 2, thin fluids, Ensure feeding supplements IVF: None VTE: SCDs Start: 01/22/23 1400 Code: DNR- Limited  DISPO: Anticipated discharge pending SNF placement  Signature: Caress Reffitt Colbert Coyer, MD Internal Medicine Resident, PGY-1 [facility]  10:02 AM, 01/30/2023   Please contact the on call pager after 5 pm and on weekends at [phone].

## 2023-01-30 NOTE — TOC Progression Note (Signed)
Transition of Care [facility]) - Progression Note    Patient Details  Name: Anne [name] MRN: [id number] Date of Birth: [birth year]  Transition of [name]) CM/SW Contact  Baldemar Lenis, [name] Phone Number: 01/30/2023, 9:53 AM  Clinical Narrative:   CSW updated by MD that peer to peer was attempted multiple times yesterday and never received a call back. CSW attempted to reach [facility] On Call RN today, left a voicemail explaining that MD was unable to complete requested peer to peer due to never receiving a call back. Awaiting response.  UPDATE: CSW received a call back from [facility] that patient was denied request for SNF. CSW asked about the denial when the peer to peer was never completed but representative could not answer the question, just that it was denied. CSW provided fax number to receive denial information to pass onto family to see if they want to file appeal. CSW updated MD. CSW to follow.    Expected Discharge Plan: Skilled Nursing Facility Barriers to Discharge: Insurance Authorization  Expected Discharge Plan and Services In-house Referral: Clinical Social Work   Post Acute Care Choice: Skilled Nursing Facility Living arrangements for the past 2 months: Single Family Home                                       Social Determinants of Health (SDOH) Interventions SDOH Screenings   Food Insecurity: Patient Unable To Answer (01/22/2023)  Housing: Patient Unable To Answer (01/22/2023)  Transportation Needs: Patient Unable To Answer (01/22/2023)  Utilities: Patient Unable To Answer (01/22/2023)  Depression (PHQ2-9): High Risk (06/21/2022)  Financial Resource Strain: Low Risk  (09/08/2020)  Physical Activity: Unknown (10/29/2018)  Social Connections: Unknown (09/08/2020)  Stress: No Stress Concern Present (09/08/2020)  Tobacco Use: Medium Risk (01/22/2023)    Readmission Risk Interventions     No data to display

## 2023-01-31 DIAGNOSIS — I10 Essential (primary) hypertension: Secondary | ICD-10-CM | POA: Diagnosis not present

## 2023-01-31 DIAGNOSIS — Z7189 Other specified counseling: Secondary | ICD-10-CM | POA: Diagnosis not present

## 2023-01-31 DIAGNOSIS — Z515 Encounter for palliative care: Secondary | ICD-10-CM | POA: Diagnosis not present

## 2023-01-31 DIAGNOSIS — F0390 Unspecified dementia without behavioral disturbance: Secondary | ICD-10-CM | POA: Diagnosis not present

## 2023-01-31 DIAGNOSIS — R569 Unspecified convulsions: Secondary | ICD-10-CM | POA: Diagnosis not present

## 2023-01-31 LAB — CBC
HCT: 43.4 % (ref 36.0–46.0)
Hemoglobin: 14.2 g/dL (ref 12.0–15.0)
MCH: 29.8 pg (ref 26.0–34.0)
MCHC: 32.7 g/dL (ref 30.0–36.0)
MCV: 91.2 fL (ref 80.0–100.0)
Platelets: 207 10*3/uL (ref 150–400)
RBC: 4.76 MIL/uL (ref 3.87–5.11)
RDW: 14.8 % (ref 11.5–15.5)
WBC: 8.7 10*3/uL (ref 4.0–10.5)
nRBC: 0 % (ref 0.0–0.2)

## 2023-01-31 LAB — BASIC METABOLIC PANEL
Anion gap: 8 (ref 5–15)
BUN: 46 mg/dL — ABNORMAL HIGH (ref 8–23)
CO2: 32 mmol/L (ref 22–32)
Calcium: 9.6 mg/dL (ref 8.9–10.3)
Chloride: 95 mmol/L — ABNORMAL LOW (ref 98–111)
Creatinine, Ser: 1.03 mg/dL — ABNORMAL HIGH (ref 0.44–1.00)
GFR, Estimated: 57 mL/min — ABNORMAL LOW (ref >60)
Glucose, Bld: 105 mg/dL — ABNORMAL HIGH (ref 70–99)
Potassium: 4 mmol/L (ref 3.5–5.1)
Sodium: 135 mmol/L (ref 135–145)

## 2023-01-31 MED ORDER — AMLODIPINE BESYLATE 5 MG PO TABS
5.0000 mg | ORAL_TABLET | Freq: Every day | ORAL | Status: DC
  Administered 2023-01-31 – 2023-02-05 (×6): 5 mg via ORAL
  Filled 2023-01-31 (×6): qty 1

## 2023-01-31 NOTE — Progress Notes (Signed)
Physical Therapy Treatment Patient Details Name: Anne [name] MRN: [id number] DOB: 1950-09-12 Today's Date: 01/31/2023   History of Present Illness Patient is a 72 year old female admitted 12/17 with new onset of seizures in the setting of underlying SDH. Fall in hospital with increased size of R SDH on CT. No change in neuro status and MD/family decided against any surgery. History of dementia, lung cancer, seizures, hypothyroid.    PT Comments  Agreeable to work with therapy. Minimal verbalization but does nod yes and no appropriately. States "I'm not sure" when asked simple orientation questions, decreased short term memory when reoriented (<64min.) Some expressive aphasia noted. LEs tolerating weight well, able to ambulate with min assist up to 55 feet, intermittent freezing. Following simple 1 step commands consistently. Patient will continue to benefit from skilled physical therapy services to further improve independence with functional mobility.     If plan is discharge home, recommend the following: Supervision due to cognitive status;Help with stairs or ramp for entrance;Assist for transportation;Direct supervision/assist for medications management;Direct supervision/assist for financial management;Assistance with cooking/housework;A little help with walking and/or transfers;A little help with bathing/dressing/bathroom   Can travel by private vehicle     No  Equipment Recommendations  None recommended by PT    Recommendations for Other Services       Precautions / Restrictions Precautions Precautions: Fall Precaution Comments: Systolic blood pressure goal below 150 Restrictions Weight Bearing Restrictions Per Provider Order: No     Mobility  Bed Mobility               General bed mobility comments: In recliner    Transfers Overall transfer level: Needs assistance Equipment used: Rolling walker (2 wheels) Transfers: Sit to/from Stand Sit to Stand: Min assist            General transfer comment: Min assist to facilitate forward and rise with some posterior instability present. Plaaces hands in correct place.    Ambulation/Gait Ambulation/Gait assistance: Min assist Gait Distance (Feet): 55 Feet Assistive device: 1 person hand held assist, Rolling walker (2 wheels) Gait Pattern/deviations: Step-through pattern, Decreased step length - right, Decreased step length - left, Shuffle, Decreased stride length, Narrow base of support, Trunk flexed Gait velocity: decreased Gait velocity interpretation: <1.31 ft/sec, indicative of household ambulator   General Gait Details: With RW, pt requires min assist intermittently for RW control and balance. Stopped several times during ambulatory bout but does not reply when asked if she is tired or dizzy. Trial of hand held support, min assist, pt reach for furniture with fee hand, demonstrates more shuffled steps, reduced confidence and increased need for stability.   Stairs             Wheelchair Mobility     Tilt Bed    Modified Rankin (Stroke Patients Only)       Balance Overall balance assessment: Needs assistance, History of Falls Sitting-balance support: Feet supported, No upper extremity supported Sitting balance-Leahy Scale: Fair Sitting balance - Comments: sits edge of chair,   Standing balance support: Single extremity supported Standing balance-Leahy Scale: Poor Standing balance comment: relaint on external support                            Cognition Arousal: Alert Behavior During Therapy: Flat affect Overall Cognitive Status: No family/caregiver present to determine baseline cognitive functioning  General Comments: Minimal verbalizations, does not yes/no appropriately most of the time. Will repeat back works with notable difficulty. Decreased short term memory. Cannot answer questions with 2 options presented. She does  follow simple 1 step commands consistently.        Exercises      General Comments        Pertinent Vitals/Pain Pain Assessment Pain Assessment: No/denies pain Pain Intervention(s): Monitored during session, Repositioned    Home Living                          Prior Function            PT Goals (current goals can now be found in the care plan section) Acute Rehab PT Goals Patient Stated Goal: daughter prefers rehab if insurance will cover the cost PT Goal Formulation: With patient Time For Goal Achievement: 02/07/23 Potential to Achieve Goals: Fair Progress towards PT goals: Progressing toward goals    Frequency    Min 1X/week      PT Plan      Co-evaluation              AM-PAC PT "6 Clicks" Mobility   Outcome Measure  Help needed turning from your back to your side while in a flat bed without using bedrails?: A Little Help needed moving from lying on your back to sitting on the side of a flat bed without using bedrails?: A Little Help needed moving to and from a bed to a chair (including a wheelchair)?: A Little Help needed standing up from a chair using your arms (e.g., wheelchair or bedside chair)?: A Little Help needed to walk in hospital room?: A Little Help needed climbing 3-5 steps with a railing? : Total 6 Click Score: 16    End of Session Equipment Utilized During Treatment: Gait belt Activity Tolerance: Patient tolerated treatment well Patient left: with call bell/phone within reach;in chair;with chair alarm set;with SCD's reapplied (telesitter) Nurse Communication: Mobility status PT Visit Diagnosis: Other abnormalities of gait and mobility (R26.89);Difficulty in walking, not elsewhere classified (R26.2);Other symptoms and signs involving the nervous system (R29.898);History of falling (Z91.81);Unsteadiness on feet (R26.81);Muscle weakness (generalized) (M62.81)     Time: 1324-4010 PT Time Calculation (min) (ACUTE ONLY): 14  min  Charges:    $Gait Training: 8-22 mins PT General Charges $$ ACUTE PT VISIT: 1 Visit                     Kathlyn Sacramento, PT, DPT [facility]  Rehabilitation Services Physical Therapist Office: [phone] Website: [email or web address]    Berton Mount 01/31/2023, 11:05 AM

## 2023-01-31 NOTE — Progress Notes (Signed)
Spoke with patient's daughter, [name], today to provide general updates and recent insurance denial for SNF.   Patient has been slow to wake up the last few days, with level of alertness improving over the course of the day. She has been agreeable to eating her dysphagia diet with nursing assistance and working with occupational therapy and physical therapy. She is able to participate in therapy sessions by following simple commands, gesturing yes/no responses to simple questions, and attempts to vocalize/verbalize. With physical therapy she has been working on functional mobility, and with occupational therapy she has been working on completing ADLs with support.   Overall, patient is medically stable but continues to present with significant deconditioning. With her willingness to work towards her therapy goals, she continues to benefit from skilled therapy with hopes to regain strength and improve independence. SNF continues to be the recommendation at this time so that the patient can continue to participate in skilled therapy services in a structured environment with appropriate support and supervision.   Reather Steller Colbert Coyer, MD PGY-1 Internal Medicine Teaching Service

## 2023-01-31 NOTE — Progress Notes (Signed)
HD#9 SUBJECTIVE:  Patient Summary: Anne [name] is a 72 yo patient with a history of dementia, HTN, HLD, hypothyroidism, depression, metastatic bone cancer and lung cancer who presented after seizure associated with subdural hematoma. Patient was admitted for seizure-like activity in the setting of evolving subdural hematoma.   Patient experienced an unwitnessed fall when patient attempted to leave her bed on 12/21 with a laceration of about 2.5 cm in length on her R posterior scalp s/p 2 stitches. Follow up CT imaging showed a mixed density subdural hematoma of the left cerebral hemisphere of increased thickness compared to previously identified subdural hematoma with an increased mass effect and partial effacement of the left lateral ventricle.   Overnight Events:  No overnight events reported.    Interim History:  Patient was asleep, with covers pulled up to her face. No family at bedside. Patient slow to wake again, did not open her eyes but shook her head in response to question about pain. Did not respond to question about being tired. Window blinds were open. Patient similarly somnolent to yesterday's morning rounds, with some improvement when team re-checked her later in the afternoon.   Will reach out to family to discuss any alternative arrangements should patient's insurance continue to deny SNF authorization.    OBJECTIVE:  Vital Signs: Vitals:   01/30/23 1956 01/30/23 2347 01/31/23 0322 01/31/23 0749  BP: 98/66 122/72 (!) 99/58 115/76  Pulse: 98 99 91 80  Resp: 18 17 17 17   Temp: (!) 97.5 F (36.4 C) 97.8 F (36.6 C) (!) 97.2 F (36.2 C) 98.6 F (37 C)  TempSrc:    Axillary  SpO2: 96% 91% 92% 93%  Weight:      Height:       Supplemental O2: Room Air SpO2: 93 % O2 Flow Rate (L/min): 2 L/min  Filed Weights   01/23/23 1417  Weight: 73.9 kg   No intake or output data in the 24 hours ending 01/31/23 0911   Net IO Since Admission: 1,147 mL [01/31/23 0911]      Latest Ref Rng & Units 01/31/2023    6:16 AM 01/29/2023    4:45 AM 01/27/2023    7:57 AM  CBC  WBC 4.0 - 10.5 K/uL 8.7  8.2  8.0   Hemoglobin 12.0 - 15.0 g/dL 19.1  47.8  29.5   Hematocrit 36.0 - 46.0 % 43.4  44.0  44.7   Platelets 150 - 400 K/uL 207  161  203        Latest Ref Rng & Units 01/31/2023    6:16 AM 01/29/2023    4:45 AM 01/27/2023    7:57 AM  CMP  Glucose 70 - 99 mg/dL 621  308  657   BUN 8 - 23 mg/dL 46  30  24   Creatinine 0.44 - 1.00 mg/dL 8.46  9.62  9.52   Sodium 135 - 145 mmol/L 135  135  138   Potassium 3.5 - 5.1 mmol/L 4.0  4.2  3.9   Chloride 98 - 111 mmol/L 95  99  101   CO2 22 - 32 mmol/L 32  24  29   Calcium 8.9 - 10.3 mg/dL 9.6  9.6  9.5     Physical Exam: Physical Exam Constitutional:      General: She is not in acute distress.    Appearance: She is not ill-appearing.  HENT:     Head:     Comments: Ecchymoses on left side continues  to heal and improve.  Pulmonary:     Effort: Pulmonary effort is normal.  Abdominal:     General: Abdomen is flat.     Palpations: Abdomen is soft.  Musculoskeletal:     Right lower leg: No edema.     Left lower leg: No edema.  Skin:    General: Skin is warm.  Neurological:     Comments: Still demonstrating decreased level of alertness, especially early in the morning. Today patient did not attempt to vocalize answers or follow simple commands. She maintained her eyes closed as if indicating that she wanted to continue sleeping.       Patient Lines/Drains/Airways Status     Active Line/Drains/Airways     Name Placement date Placement time Site Days   Peripheral IV 01/26/23 22 G 1.16" Anterior;Right Forearm 01/26/23  0530  Forearm  1   Wound / Incision (Open or Dehisced) 10/29/18 Other (Comment) Leg Left;Posterior;Upper Boil 10/29/18  0058  Leg  1551   Wound / Incision (Open or Dehisced) 01/26/23 Laceration Head Posterior;Right;Upper laceration 01/26/23  1610  Head  1             ASSESSMENT/PLAN:   Assessment: Principal Problem:   Seizure (HCC) Active Problems:   Hypertension   Subdural hematoma [facility])  Patient Summary Anne [name] is a 72 yo with history of progressive dementia who presented to [facility] ED on the morning of 01/22/2023 after a seizure and was admitted for subdural hematoma with subsequent seizure and somnolence.    Plan: Subdural Hematoma secondary to fall from standing with Seizures and Somnolence New fall from hospital bed causing rebleed, scalp laceration Patient with fluctuating levels of alertness, especially from morning to afternoon, but overall decreased alertness compared to a few days ago. Team will reach out to SW and family to determine plan moving forward should insurance continue to deny SNF authorization, especially given patient's progressive decline in function.  - Continue PO Keppra 100 mg q12H with IV midazolam 2 mg PRN for rescue      Dementia Continue to hold centrally acting medications. Continue to recommend family presence and orientation strategies (orienting patient to day, time, place, keeping blinds open during the day, getting meals regularly).  - Holding home memantine 10 mg BID, rivastigmine 3 mg BID, Depakote 375 mg TID, Seroquel 25 mg daily at bedtime   HTN Goal SBP is below 150 per evolving subdural hematoma. Discontinued labetalol during hospitalization due to bradycardia, and avoided nitroprusside due to increased ICP. Increased home amlodipine to 10 mg and started patient on ARB. BP have been 90s-120s/50s-80s. Morning BP 99/58, reduced amlodipine dose in case low BP is contributing to patient's reduced activity. Will monitor and consider increasing back to 10 mg daily dose if BP is persistently elevated.  - Amlodipine 5 mg daily, Irbesartan 300 mg daily  - IV hydralazine 2 mg q4H PRN if systolic > 150    Thrombocytopenia- resolved CBC at admission showed platelets 123. On home Depakote, which was held on admission. Platelet count WNL  at 207 12/26. - Monitor    Depression/Anxiety -Held home sertraline 150 mg daily and home Wellbutrin 300 mg daily on admission in setting of seizures. Reevaluate at follow up.    Hypothyroidism Resume synthroid 88 mcg daily at discharge.    HLD Resume rosuvastatin 20 mg daily at discharge.    Best Practice: Diet: DYS 2, thin fluids, Ensure feeding supplements IVF: None VTE: SCDs Start: 01/22/23 1400 Code: DNR- Limited  DISPO: Anticipated discharge pending SNF placement  Signature: Charlee Squibb Colbert Coyer, MD Internal Medicine Resident, PGY-1 [facility]  9:11 AM, 01/31/2023   Please contact the on call pager after 5 pm and on weekends at [phone].

## 2023-01-31 NOTE — Progress Notes (Signed)
Palliative Medicine Inpatient Follow Up Note HPI: Anne [name] is a 72 y.o. 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 She presented after a seizure. Patient has had extension of subdural hematoma. There is no surgical intervention to be pursued at this point per family wishes. The PMT has been asked to get involved to continue goals of care conversations.   Today's Discussion 01/31/2023  *Please note that this is a verbal dictation therefore any spelling or grammatical errors are due to the "Dragon [employer] One" system interpretation.  Chart reviewed inclusive of vital signs, progress notes, laboratory results, and diagnostic images.   I met with Anne [name] at bedside this morning. She was sleepy though arousable. There was no family present therefore I determined that I would be back later in the day.  Per patients RN, Anne [name]. Patient has walked with PT today and is receptive to food if fed.   I met at bedside this afternoon with patients spouse, Anne [name]. He shares that he remains hopeful to skilled nursing placement, though he is aiming for something closer to him. We reviewed that as of now, Anne [name] has not been accepted to any facilities and in fact she has been denied by insurance. He requests an update on this. I shared that I would ask the MSW to reach out.  I called and spoke to patients daughter, Anne [name]. We reviewed that Anne [name] has been more somnolent over the past few days but she does appear to fluctuate per what patients other daughter, Anne [name] has been sharing with Anne [name].  We discussed that this can go on indefinitely though the worry of patient poor long term prognosis is known to Anne [name] appears to be  aware of the issues associated with patients placement. She asks if perhaps Anne [name] would be able to qualify for medicaid. I shared that I would defer to the MSW team.   Questions and concerns addressed/Palliative Support Provided.   Objective Assessment: Vital Signs Vitals:   01/31/23 0749 01/31/23 1120  BP: 115/76 99/64  Pulse: 80   Resp: 17 17  Temp: 98.6 F (37 C) 98.4 F (36.9 C)  SpO2: 93% 95%    Intake/Output Summary (Last 24 hours) at 01/31/2023 1351 Last data filed at 01/31/2023 0920 Gross per 24 hour  Intake 75 ml  Output --  Net 75 ml   Last Weight  Most recent update: 01/23/2023  2:24 PM    Weight  73.9 kg (162 lb 14.7 oz)            Gen:  Elderly Caucasian F in NAD HEENT: moist mucous membranes CV: Regular rate and rhythm  PULM:  On RA, breathing is even and nonlabored ABD: soft/nontender  EXT: No edema  Neuro: Alert and oriented x1  SUMMARY OF RECOMMENDATIONS   DNAR/DNI    Plan for OP Palliative care follow up --> If patient declines as an OP then family aware of the ability to transition to hospice   Patient denied from SNF --> Peer to peer in process  At this time patient remains to be eating and mobilizing with little symptom burden. IP hospice would likely not accept patient as of now. If were to decline further an evaluation would be within reason   Ongoing incremental PMT support  Billing based on MDM: Moderate  ______________________________________________________________________________________ Lamarr Lulas [place] Palliative Medicine Team Team Cell Phone: [phone] Please utilize secure chat with additional questions, if there is no response within 30 minutes please call the above phone number  Palliative Medicine Team providers are available by phone from 7am to 7pm daily and can be reached through the team cell phone.  Should this patient require assistance outside of these hours, please call the patient's attending  physician.

## 2023-01-31 NOTE — TOC Progression Note (Signed)
Transition of Care [facility]) - Progression Note    Patient Details  Name: Anne [name] MRN: [id number] Date of Birth: May 27, 1950  Transition of Care [facility]) CM/SW Contact  Baldemar Lenis, [name] Phone Number: 01/31/2023, 3:54 PM  Clinical Narrative:   CSW met with patient's spouse, [name], at bedside to discuss denial for SNF. [name] would like to appeal. CSW explained appeal process and answered questions, but spouse did not seem to fully understand the process, so CSW also contacted daughter, [name], to explain and answer questions. [name] also in agreement with appealing, and also discussed that patient will ultimately need long term care as her spouse cannot care for her. CSW to reach out to financial counseling to see if they can assist with Medicaid for the patient for LTC. CSW contacted [employer] to file appeal, they took CSW information and will call back to initiate the appeal process. CSW to follow.    Expected Discharge Plan: Skilled Nursing Facility Barriers to Discharge: Insurance Authorization  Expected Discharge Plan and Services In-house Referral: Clinical Social Work   Post Acute Care Choice: Skilled Nursing Facility Living arrangements for the past 2 months: Single Family Home                                       Social Determinants of Health (SDOH) Interventions SDOH Screenings   Food Insecurity: Patient Unable To Answer (01/22/2023)  Housing: Patient Unable To Answer (01/22/2023)  Transportation Needs: Patient Unable To Answer (01/22/2023)  Utilities: Patient Unable To Answer (01/22/2023)  Depression (PHQ2-9): High Risk (06/21/2022)  Financial Resource Strain: Low Risk  (09/08/2020)  Physical Activity: Unknown (10/29/2018)  Social Connections: Unknown (09/08/2020)  Stress: No Stress Concern Present (09/08/2020)  Tobacco Use: Medium Risk (01/22/2023)    Readmission Risk Interventions     No data to display

## 2023-02-01 LAB — BASIC METABOLIC PANEL
Anion gap: 10 (ref 5–15)
BUN: 41 mg/dL — ABNORMAL HIGH (ref 8–23)
CO2: 26 mmol/L (ref 22–32)
Calcium: 9.4 mg/dL (ref 8.9–10.3)
Chloride: 100 mmol/L (ref 98–111)
Creatinine, Ser: 0.99 mg/dL (ref 0.44–1.00)
GFR, Estimated: 60 mL/min — ABNORMAL LOW (ref >60)
Glucose, Bld: 89 mg/dL (ref 70–99)
Potassium: 5 mmol/L (ref 3.5–5.1)
Sodium: 136 mmol/L (ref 135–145)

## 2023-02-01 MED ORDER — HYDROCORTISONE (PERIANAL) 2.5 % EX CREA
TOPICAL_CREAM | Freq: Two times a day (BID) | CUTANEOUS | Status: DC
Start: 2023-02-01 — End: 2023-02-18
  Administered 2023-02-02 – 2023-02-16 (×6): 1 via RECTAL
  Filled 2023-02-01: qty 28.35

## 2023-02-01 NOTE — TOC Progression Note (Signed)
Transition of Care [facility]) - Progression Note    Patient Details  Name: Anne [name] MRN: [id number] Date of Birth: 09-Feb-1950  Transition of Care [facility]) CM/SW Contact  Baldemar Lenis, [name] Phone Number: 02/01/2023, 4:16 PM  Clinical Narrative:   CSW worked towards filing appeal today, obtained information that SNF request was denied due to insurance not believing patient was ready for discharge. CSW contacted [employer] to initiate new auth request as patient is now medically stable for discharge to SNF. Awaiting response from insurance. CSW to follow.    Expected Discharge Plan: Skilled Nursing Facility Barriers to Discharge: Insurance Authorization  Expected Discharge Plan and Services In-house Referral: Clinical Social Work   Post Acute Care Choice: Skilled Nursing Facility Living arrangements for the past 2 months: Single Family Home                                       Social Determinants of Health (SDOH) Interventions SDOH Screenings   Food Insecurity: Patient Unable To Answer (01/22/2023)  Housing: Patient Unable To Answer (01/22/2023)  Transportation Needs: Patient Unable To Answer (01/22/2023)  Utilities: Patient Unable To Answer (01/22/2023)  Depression (PHQ2-9): High Risk (06/21/2022)  Financial Resource Strain: Low Risk  (09/08/2020)  Physical Activity: Unknown (10/29/2018)  Social Connections: Unknown (09/08/2020)  Stress: No Stress Concern Present (09/08/2020)  Tobacco Use: Medium Risk (01/22/2023)    Readmission Risk Interventions     No data to display

## 2023-02-01 NOTE — Plan of Care (Signed)

## 2023-02-01 NOTE — Progress Notes (Signed)
HD#10 SUBJECTIVE:  Patient Summary: Anne [name] is a 72 yo patient with a history of dementia, HTN, HLD, hypothyroidism, depression, metastatic bone cancer and lung cancer who presented after seizure associated with subdural hematoma. Patient was admitted for seizure-like activity in the setting of evolving subdural hematoma.   Patient experienced an unwitnessed fall when patient attempted to leave her bed on 12/21 with a laceration of about 2.5 cm in length on her R posterior scalp s/p 2 stitches. Follow up CT imaging showed a mixed density subdural hematoma of the left cerebral hemisphere of increased thickness compared to previously identified subdural hematoma with an increased mass effect and partial effacement of the left lateral ventricle.   Overnight Events:  No overnight events reported.    Interim History:  Patient's husband is at bedside. Expressed some frustration regarding level of assistance patient requires at mealtimes and bathroom use and amount of time she has had to wait for that assistance. Tried reassuring the patient that she has been getting the appropriate level of support.   Patient is sitting up in bed, attempting to eat her meal independently. She was observed using eating utensils and taking bites and a drink with a straw with no issues. She was able to answer simple yes/no questions regarding pain, fatigue, and eating. She was also able to follow simple commands with minimal prompting to participate in today's physical exam.   OBJECTIVE:  Vital Signs: Vitals:   01/31/23 2010 02/01/23 0016 02/01/23 0447 02/01/23 0750  BP: (!) 100/59 (!) 99/57 127/70 118/69  Pulse: 86 88 75 73  Resp: 18 17 17 16   Temp: (!) 97.5 F (36.4 C) (!) 97.5 F (36.4 C) (!) 97.2 F (36.2 C) (!) 97.5 F (36.4 C)  TempSrc: Oral   Oral  SpO2: 95% 94% 99% (!) 79%  Weight:      Height:       Supplemental O2: Room Air SpO2: (!) 79 % O2 Flow Rate (L/min): 2 L/min  Filed Weights    01/23/23 1417  Weight: 73.9 kg    Intake/Output Summary (Last 24 hours) at 02/01/2023 1109 Last data filed at 02/01/2023 0959 Gross per 24 hour  Intake 381 ml  Output 1 ml  Net 380 ml    Net IO Since Admission: 1,602 mL [02/01/23 1109]     Latest Ref Rng & Units 01/31/2023    6:16 AM 01/29/2023    4:45 AM 01/27/2023    7:57 AM  CBC  WBC 4.0 - 10.5 K/uL 8.7  8.2  8.0   Hemoglobin 12.0 - 15.0 g/dL 82.9  56.2  13.0   Hematocrit 36.0 - 46.0 % 43.4  44.0  44.7   Platelets 150 - 400 K/uL 207  161  203        Latest Ref Rng & Units 02/01/2023    6:49 AM 01/31/2023    6:16 AM 01/29/2023    4:45 AM  CMP  Glucose 70 - 99 mg/dL 89  865  784   BUN 8 - 23 mg/dL 41  46  30   Creatinine 0.44 - 1.00 mg/dL 6.96  2.95  2.84   Sodium 135 - 145 mmol/L 136  135  135   Potassium 3.5 - 5.1 mmol/L 5.0  4.0  4.2   Chloride 98 - 111 mmol/L 100  95  99   CO2 22 - 32 mmol/L 26  32  24   Calcium 8.9 - 10.3 mg/dL 9.4  9.6  9.6     Physical Exam: Physical Exam Constitutional:      General: She is not in acute distress.    Appearance: She is not ill-appearing.  HENT:     Head:     Comments: Ecchymoses on left side continues to heal and improve.  Pulmonary:     Effort: Pulmonary effort is normal.  Abdominal:     General: Abdomen is flat.     Palpations: Abdomen is soft.  Musculoskeletal:     Right lower leg: No edema.     Left lower leg: No edema.     Comments: Moving all limbs spontaneously and on command.   Skin:    General: Skin is warm.  Neurological:     Comments: Alert and oriented to self, able to answer simple/yes no questions with gestures, attempting to vocalize, attempted to ask a question regarding one of the team members in the room, able to follow simple verbal commands accurately.       Patient Lines/Drains/Airways Status     Active Line/Drains/Airways     Name Placement date Placement time Site Days   Peripheral IV 01/26/23 22 G 1.16" Anterior;Right Forearm  01/26/23  0530  Forearm  1   Wound / Incision (Open or Dehisced) 10/29/18 Other (Comment) Leg Left;Posterior;Upper Boil 10/29/18  0058  Leg  1551   Wound / Incision (Open or Dehisced) 01/26/23 Laceration Head Posterior;Right;Upper laceration 01/26/23  1610  Head  1             ASSESSMENT/PLAN:  Assessment: Principal Problem:   Seizure (HCC) Active Problems:   Hypertension   Subdural hematoma [facility])  Patient Summary Anne [name] is a 72 yo with history of progressive dementia who presented to [facility] ED on the morning of 01/22/2023 after a seizure and was admitted for subdural hematoma with subsequent seizure and somnolence.    Plan: Subdural Hematoma secondary to fall from standing with Seizures and Somnolence New fall from hospital bed causing rebleed, scalp laceration Patient with fluctuating levels of alertness, especially from morning to afternoon. Appears to be at baseline this morning. SW still working on SNF placement.  - Continue PO Keppra 100 mg q12H with IV midazolam 2 mg PRN for rescue      Dementia Continue to hold centrally acting medications. Continue to recommend family presence and orientation strategies (orienting patient to day, time, place, keeping blinds open during the day, getting meals regularly).  - Holding home memantine 10 mg BID, rivastigmine 3 mg BID, Depakote 375 mg TID, Seroquel 25 mg daily at bedtime   HTN Goal SBP is below 150 per evolving subdural hematoma. Discontinued labetalol during hospitalization due to bradycardia, and avoided nitroprusside due to increased ICP. Increased home amlodipine to 10 mg and started patient on ARB. BP have been 90s-120s/50s-80s. Reduced amlodipine 10 mg to 5 mg due to lower blood pressures. Will monitor and consider increasing back to 10 mg daily dose if BP is persistently elevated.  - Amlodipine 5 mg daily, Irbesartan 300 mg daily  - IV hydralazine 2 mg q4H PRN if systolic > 150    Thrombocytopenia- resolved CBC at  admission showed platelets 123. On home Depakote, which was held on admission. Platelet count WNL at 207 12/26. - Monitor    Depression/Anxiety -Held home sertraline 150 mg daily and home Wellbutrin 300 mg daily on admission in setting of seizures. Reevaluate at follow up.    Hypothyroidism Resume synthroid 88 mcg daily at discharge.  HLD Resume rosuvastatin 20 mg daily at discharge.    Best Practice: Diet: DYS 2, thin fluids, Ensure feeding supplements IVF: None VTE: SCDs Start: 01/22/23 1400 Code: DNR- Limited  DISPO: Anticipated discharge pending SNF placement  Signature: Melbert Botelho Colbert Coyer, MD Internal Medicine Resident, PGY-1 [facility]  11:09 AM, 02/01/2023   Please contact the on call pager after 5 pm and on weekends at [phone].

## 2023-02-02 DIAGNOSIS — E785 Hyperlipidemia, unspecified: Secondary | ICD-10-CM

## 2023-02-02 DIAGNOSIS — K649 Unspecified hemorrhoids: Secondary | ICD-10-CM

## 2023-02-02 DIAGNOSIS — F0390 Unspecified dementia without behavioral disturbance: Secondary | ICD-10-CM | POA: Diagnosis not present

## 2023-02-02 DIAGNOSIS — R339 Retention of urine, unspecified: Secondary | ICD-10-CM

## 2023-02-02 DIAGNOSIS — S065XAD Traumatic subdural hemorrhage with loss of consciousness status unknown, subsequent encounter: Secondary | ICD-10-CM | POA: Diagnosis not present

## 2023-02-02 DIAGNOSIS — E039 Hypothyroidism, unspecified: Secondary | ICD-10-CM

## 2023-02-02 LAB — BASIC METABOLIC PANEL
Anion gap: 7 (ref 5–15)
BUN: 31 mg/dL — ABNORMAL HIGH (ref 8–23)
CO2: 26 mmol/L (ref 22–32)
Calcium: 9.3 mg/dL (ref 8.9–10.3)
Chloride: 103 mmol/L (ref 98–111)
Creatinine, Ser: 0.92 mg/dL (ref 0.44–1.00)
GFR, Estimated: >60 mL/min (ref >60)
Glucose, Bld: 94 mg/dL (ref 70–99)
Potassium: 4.6 mmol/L (ref 3.5–5.1)
Sodium: 136 mmol/L (ref 135–145)

## 2023-02-02 MED ORDER — ROSUVASTATIN CALCIUM 20 MG PO TABS
20.0000 mg | ORAL_TABLET | Freq: Every day | ORAL | Status: DC
  Administered 2023-02-02 – 2023-02-18 (×17): 20 mg via ORAL
  Filled 2023-02-02 (×17): qty 1

## 2023-02-02 MED ORDER — PHENYLEPHRINE-MINERAL OIL-PET 0.25-14-74.9 % RE OINT
1.0000 | TOPICAL_OINTMENT | Freq: Two times a day (BID) | RECTAL | Status: DC
Start: 2023-02-02 — End: 2023-02-18
  Administered 2023-02-02 – 2023-02-18 (×31): 1 via RECTAL
  Filled 2023-02-02: qty 57

## 2023-02-02 MED ORDER — LEVOTHYROXINE SODIUM 88 MCG PO TABS
88.0000 ug | ORAL_TABLET | Freq: Every day | ORAL | Status: DC
  Administered 2023-02-03 – 2023-02-18 (×16): 88 ug via ORAL
  Filled 2023-02-02 (×16): qty 1

## 2023-02-02 MED ORDER — IRBESARTAN 150 MG PO TABS
75.0000 mg | ORAL_TABLET | Freq: Every day | ORAL | Status: DC
  Administered 2023-02-03 – 2023-02-05 (×3): 75 mg via ORAL
  Filled 2023-02-02 (×3): qty 1

## 2023-02-02 MED ORDER — LEVOTHYROXINE SODIUM 112 MCG PO TABS
112.0000 ug | ORAL_TABLET | Freq: Every day | ORAL | Status: DC
Start: 2023-02-03 — End: 2023-02-02

## 2023-02-02 NOTE — Care Plan (Addendum)
Called Dr. Logan Bores to initiate peer to peer conversation regarding appeal of SNF denial by insurance.  Ieft vm with my contact info.   Dr. Charissa Bash, attending  *Contact made with Dr. Logan Bores to discuss case.  He will be reviewing updates and will communicate a decision to SW regarding insurance perspective on SNF appropriateness.

## 2023-02-02 NOTE — Progress Notes (Signed)
Occupational Therapy Treatment Patient Details Name: Anne [name] MRN: [id number] DOB: [birth year] Today's Date: 02/02/2023   History of present illness Patient is a 72 year old female admitted 12/17 with new onset of seizures in the setting of underlying SDH. Fall in hospital with increased size of R SDH on CT. No change in neuro status and MD/family decided against any surgery. History of dementia, lung cancer, seizures, hypothyroid.   OT comments  Patient seated in recliner upon entry. Patient assisted with UB/LB dressing with verba cues for sequencing and min assist to donn pullover top and mod assist with pants due to assistance with threading legs into clothing and pulling up while standing. Patient stood at sink for grooming tasks with cues for sequencing and following one step commands. Patient able to stand at sink with one extremity support but required seated rest break before completing. Patient will benefit from continued inpatient follow up therapy, <3 hours/day for continued OT treatment to increase safety and independence with bathing, dressing, and functional transfers. Acute OT to continue to follow while in this setting.       If plan is discharge home, recommend the following:  A lot of help with walking and/or transfers;A lot of help with bathing/dressing/bathroom   Equipment Recommendations  BSC/3in1    Recommendations for Other Services      Precautions / Restrictions Precautions Precautions: Fall Precaution Comments: Systolic blood pressure goal below 150; watch for orthostasis Restrictions Weight Bearing Restrictions Per Provider Order: No       Mobility Bed Mobility Overal bed mobility: Needs Assistance             General bed mobility comments: OOB in recliner    Transfers Overall transfer level: Needs assistance Equipment used: Rolling walker (2 wheels) Transfers: Sit to/from Stand Sit to Stand: Min assist           General transfer  comment: cues for hand placement and min assist to stand for pulling up pants and standing at sink for grooming tasks     Balance Overall balance assessment: Needs assistance, History of Falls Sitting-balance support: Feet supported, No upper extremity supported Sitting balance-Leahy Scale: Fair Sitting balance - Comments: sits edge of chair,   Standing balance support: Single extremity supported, Bilateral upper extremity supported, During functional activity Standing balance-Leahy Scale: Poor Standing balance comment: able to stand at sink with one extremity support for grooming tasks                           ADL either performed or assessed with clinical judgement   ADL Overall ADL's : Needs assistance/impaired     Grooming: Wash/dry hands;Wash/dry face;Oral care;Minimal assistance;Standing;Cueing for sequencing Grooming Details (indicate cue type and reason): stood at sink with 2 stands to complete tasks         Upper Body Dressing : Minimal assistance;Sitting;Cueing for sequencing Upper Body Dressing Details (indicate cue type and reason): to donn pullover top Lower Body Dressing: Moderate assistance;Sit to/from stand;Cueing for sequencing Lower Body Dressing Details (indicate cue type and reason): mod assist to thread legs into pants and to pull up while standing Toilet Transfer: Minimal assistance Toilet Transfer Details (indicate cue type and reason): simulated to chair           General ADL Comments: following one step commands with cues for sequencing    Extremity/Trunk Assessment              Vision  Perception     Praxis      Cognition Arousal: Alert Behavior During Therapy: Flat affect Overall Cognitive Status: Impaired/Different from baseline Area of Impairment: Orientation, Memory, Following commands, Safety/judgement, Awareness                 Orientation Level: Disoriented to, Place, Time, Situation   Memory:  Decreased short-term memory Following Commands: Follows one step commands inconsistently, Follows multi-step commands inconsistently Safety/Judgement: Decreased awareness of safety, Decreased awareness of deficits     General Comments: limited verbalizations, following one step commands        Exercises      Shoulder Instructions       General Comments Husband present during visit, assisted with verbal cues for self care    Pertinent Vitals/ Pain       Pain Assessment Pain Assessment: No/denies pain  Home Living                                          Prior Functioning/Environment              Frequency  Min 1X/week        Progress Toward Goals  OT Goals(current goals can now be found in the care plan section)  Progress towards OT goals: Progressing toward goals  Acute Rehab OT Goals OT Goal Formulation: Patient unable to participate in goal setting Time For Goal Achievement: 02/07/23 Potential to Achieve Goals: Good ADL Goals Pt Will Perform Grooming: with set-up;with supervision;sitting Pt Will Perform Upper Body Dressing: with min assist;sitting Pt Will Transfer to Toilet: with contact guard assist;bedside commode;ambulating Pt Will Perform Toileting - Clothing Manipulation and hygiene: with min assist;sit to/from stand Additional ADL Goal #1: Pt will demonstrate sustained attention during ADLs with Min cues  Plan      Co-evaluation                 AM-PAC OT "6 Clicks" Daily Activity     Outcome Measure   Help from another person eating meals?: A Little Help from another person taking care of personal grooming?: A Little Help from another person toileting, which includes using toliet, bedpan, or urinal?: A Lot Help from another person bathing (including washing, rinsing, drying)?: A Lot Help from another person to put on and taking off regular upper body clothing?: A Lot Help from another person to put on and taking off  regular lower body clothing?: A Lot 6 Click Score: 14    End of Session Equipment Utilized During Treatment: Gait belt;Rolling walker (2 wheels)  OT Visit Diagnosis: Unsteadiness on feet (R26.81);Other abnormalities of gait and mobility (R26.89);Muscle weakness (generalized) (M62.81)   Activity Tolerance Patient tolerated treatment well   Patient Left in chair;with call bell/phone within reach;with chair alarm set;with family/visitor present   Nurse Communication Mobility status        Time: 9562-1308 OT Time Calculation (min): 19 min  Charges: OT General Charges $OT Visit: 1 Visit OT Treatments $Self Care/Home Management : 8-22 mins  Alfonse Flavors, OTA Acute Rehabilitation Services  Office [phone]   Dewain Penning 02/02/2023, 11:51 AM

## 2023-02-02 NOTE — Progress Notes (Signed)
BP retaken and HR down to 107.

## 2023-02-02 NOTE — Plan of Care (Signed)

## 2023-02-02 NOTE — Plan of Care (Signed)
  Problem: Clinical Measurements: Goal: Will remain free from infection Outcome: Progressing Goal: Respiratory complications will improve Outcome: Progressing Goal: Cardiovascular complication will be avoided Outcome: Progressing   Problem: Coping: Goal: Level of anxiety will decrease Outcome: Progressing   Problem: Elimination: Goal: Will not experience complications related to urinary retention Outcome: Progressing   Problem: Safety: Goal: Ability to remain free from injury will improve Outcome: Progressing

## 2023-02-02 NOTE — Progress Notes (Signed)
HD#11 SUBJECTIVE:  Patient Summary:  Anne [name] is a 72 yo patient with a history of dementia, HTN, HLD, hypothyroidism, depression, metastatic bone cancer and lung cancer who presented after seizure associated with subdural hematoma. Patient was admitted for seizure-like activity in the setting of evolving subdural hematoma.    Patient experienced an unwitnessed fall when patient attempted to leave her bed on 12/21 with a laceration of about 2.5 cm in length on her R posterior scalp s/p 2 stitches. Follow up CT imaging showed a mixed density subdural hematoma of the left cerebral hemisphere of increased thickness compared to previously identified subdural hematoma with an increased mass effect and partial effacement of the left lateral ventricle.    Overnight Events:  No overnight events reported.   Interim History: Per report from patient's husband at bedside, Anne [name] is experiencing discomfort from her hemorrhoids. He brought in preparation H cream which usually helps her. He is also concerned about her restlessness and would like to be able to move her about the unit.  OBJECTIVE:  Vital Signs: Vitals:   02/02/23 0003 02/02/23 0407 02/02/23 0755 02/02/23 1043  BP: 118/74 119/79 112/82 122/71  Pulse: 79 73 75 84  Resp: 18 18 17 17   Temp: 97.6 F (36.4 C) 97.6 F (36.4 C) 97.6 F (36.4 C) 98 F (36.7 C)  TempSrc: Oral Oral Oral Oral  SpO2: 98% 94% 99% 97%  Weight:      Height:       Supplemental O2: Nasal Cannula SpO2: 97 % O2 Flow Rate (L/min): 2 L/min  Filed Weights   01/23/23 1417  Weight: 73.9 kg    No intake or output data in the 24 hours ending 02/02/23 1338 Net IO Since Admission: 1,602 mL [02/02/23 1338]  Physical Exam: Physical Exam Constitutional:      General: She is not in acute distress.    Appearance: She is not ill-appearing.  Cardiovascular:     Rate and Rhythm: Normal rate.     Pulses: Normal pulses.  Pulmonary:     Effort: Pulmonary  effort is normal.  Abdominal:     General: Abdomen is flat. Bowel sounds are normal.  Musculoskeletal:     Right lower leg: No edema.     Left lower leg: No edema.  Skin:    General: Skin is warm and dry.  Neurological:     General: No focal deficit present.     Mental Status: She is alert. Mental status is at baseline.     Comments: Very alert to visual and audible stimuli. Looks around room spontaneously. Verbal responses are minimal - this morning she only mumbles. Move all limbs spontaneously.     Patient Lines/Drains/Airways Status     Active Line/Drains/Airways     Name Placement date Placement time Site Days   Wound / Incision (Open or Dehisced) 10/29/18 Other (Comment) Leg Left;Posterior;Upper Boil 10/29/18  0058  Leg  1557   Wound / Incision (Open or Dehisced) 01/26/23 Laceration Head Posterior;Right;Upper laceration 01/26/23  1610  Head  7             ASSESSMENT/PLAN:  Assessment: Principal Problem:   Seizure (HCC) Active Problems:   Hypertension   Subdural hematoma (HCC)  Anne [name] is a 72 yo with history of progressive dementia who presented to [facility] ED on the morning of 01/22/2023 after a seizure and was admitted for subdural hematoma with subsequent seizure and somnolence.   Patient experienced an unwitnessed  fall when patient attempted to leave her bed on 12/21 with a laceration of about 2.5 cm in length on her R posterior scalp s/p 2 stitches. Follow up CT imaging showed a mixed density subdural hematoma of the left cerebral hemisphere of increased thickness compared to previously identified subdural hematoma with an increased mass effect and partial effacement of the left lateral ventricle.   ~She is medically stable for discharge having recovered much of her previous function and certainly has improved from the somnolence she exhibited at admission. No further medical interventions are planned. She is considering a follow up with her neurosurgeon in the  coming weeks.  Plan: Subdural Hematoma secondary to fall from standing with Seizures and Somnolence New fall from hospital bed causing rebleed, scalp laceration Patient with fluctuating levels of alertness, especially from morning to afternoon. Appears to be at baseline this morning. SW is still working on SNF placement.  - Continue PO Keppra 100 mg q12H with IV midazolam 2 mg PRN for rescue      Hemorrhoids Examination deferred by patient. Preparation H was given, now ordered twice daily, and appears to have relieved the patient. - Preparation H rectal ointment BID  Dementia Continue to hold centrally acting medications. Continue to recommend family presence and orientation strategies (orienting patient to day, time, place, keeping blinds open during the day, getting meals regularly).  - Holding home memantine 10 mg BID, rivastigmine 3 mg BID, Depakote 375 mg TID, Seroquel 25 mg daily at bedtime   HTN Goal SBP is below 150 per evolving subdural hematoma. Discontinued labetalol during hospitalization due to bradycardia, and avoided nitroprusside due to increased ICP. Increased home amlodipine to 10 mg and started patient on ARB. BP have been 90s-120s/50s-80s. Reduced amlodipine 10 mg to 5 mg due to lower blood pressures. Will monitor and consider increasing back to 10 mg daily dose if BP is persistently elevated.  - Amlodipine 5 mg daily, Irbesartan 300 mg daily  - IV hydralazine 2 mg q4H PRN if systolic > 150    Thrombocytopenia- resolved CBC at admission showed platelets 123. On home Depakote, which was held on admission. Platelet count WNL at 207 12/26. - Monitor    Depression/Anxiety -Held home sertraline 150 mg daily and home Wellbutrin 300 mg daily on admission in setting of seizures. Reevaluate at follow up.    Hypothyroidism Resume synthroid 88 mcg daily.    HLD Resume rosuvastatin 20 mg daily.   Best Practice: Diet: Dys 2, thin fluids, Ensure for feeding supplement IVF:  None VTE: SCDs Start: 01/22/23 1400 Code: DNR/DNI AB: None Therapy Recs: SNF DISPO: Anticipated discharge to SNF pending approval  Signature: Katheran James, D.O.  Internal Medicine Resident, PGY-1 [facility]  Pager: [phone] 1:38 PM, 02/02/2023   Please contact the on call pager after 5 pm and on weekends at [phone].

## 2023-02-02 NOTE — TOC Progression Note (Signed)
Transition of Care [facility]) - Progression Note    Patient Details  Name: Anne [name] MRN: [id number] Date of Birth: [birth year]  [facility]) CM/SW Contact  Dellie Burns [place], [name] Phone Number: 02/02/2023, 9:14 AM  Clinical Narrative: Call received from Tammy with [employer] offering a peer-to-peer review for SNF. Review can be completed by calling [employer] physician Dr. Logan Bores [phone] by 3pm today. MD updated.   Dellie Burns, MSW, LCSW [phone] (coverage)        Expected Discharge Plan: Skilled Nursing Facility Barriers to Discharge: Insurance Authorization  Expected Discharge Plan and Services In-house Referral: Clinical Social Work   Post Acute Care Choice: Skilled Nursing Facility Living arrangements for the past 2 months: Single Family Home                                       Social Determinants of Health (SDOH) Interventions SDOH Screenings   Food Insecurity: Patient Unable To Answer (01/22/2023)  Housing: Patient Unable To Answer (01/22/2023)  Transportation Needs: Patient Unable To Answer (01/22/2023)  Utilities: Patient Unable To Answer (01/22/2023)  Depression (PHQ2-9): High Risk (06/21/2022)  Financial Resource Strain: Low Risk  (09/08/2020)  Physical Activity: Unknown (10/29/2018)  Social Connections: Unknown (09/08/2020)  Stress: No Stress Concern Present (09/08/2020)  Tobacco Use: Medium Risk (01/22/2023)    Readmission Risk Interventions     No data to display

## 2023-02-02 NOTE — Progress Notes (Signed)
Physical Therapy Treatment Patient Details Name: Anne [name] MRN: [id number] DOB: 21-Oct-1950 Today's Date: 02/02/2023   History of Present Illness Patient is a 72 year old female admitted 12/17 with new onset of seizures in the setting of underlying SDH. Fall in hospital with increased size of R SDH on CT. No change in neuro status and MD/family decided against any surgery. History of dementia, lung cancer, seizures, hypothyroid.    PT Comments  Patient alert and able to follow simple commands (sometimes needing repetition--?internally distracted and loses track of request). Able to ambulate ~15 ft with RW and min assist and then pt stopped and would not continue. Would not answer questions as to why. Denied dizziness but then began to tremble and returned to her room to sit in chair. Orthostatic BPs then assessed with +drop. RN and MD made aware.  Making progress towards goals.    If plan is discharge home, recommend the following: Supervision due to cognitive status;Help with stairs or ramp for entrance;Assist for transportation;Direct supervision/assist for medications management;Direct supervision/assist for financial management;Assistance with cooking/housework;A little help with walking and/or transfers;A little help with bathing/dressing/bathroom   Can travel by private vehicle     No  Equipment Recommendations  None recommended by PT    Recommendations for Other Services       Precautions / Restrictions Precautions Precautions: Fall Precaution Comments: Systolic blood pressure goal below 150; watch for orthostasis Restrictions Weight Bearing Restrictions Per Provider Order: No     Mobility  Bed Mobility Overal bed mobility: Needs Assistance Bed Mobility: Supine to Sit     Supine to sit: Min assist, HOB elevated     General bed mobility comments: assist to raise torso; cues to continue to pivot around to get feet on the floor    Transfers Overall transfer level:  Needs assistance Equipment used: Rolling walker (2 wheels) Transfers: Sit to/from Stand Sit to Stand: Min assist           General transfer comment: Min assist to facilitate forward and rise. vc for correct hand placement with sit to stand and return to sit    Ambulation/Gait Ambulation/Gait assistance: Min assist Gait Distance (Feet): 35 Feet Assistive device: 1 person hand held assist, Rolling walker (2 wheels) Gait Pattern/deviations: Step-through pattern, Decreased step length - right, Decreased step length - left, Shuffle, Decreased stride length, Narrow base of support, Trunk flexed Gait velocity: decreased     General Gait Details: With RW, pt requires min assist intermittently for RW control/steering and balance. Stopped  walking and would not respond to why she had stopped or if she was dizzy. She began to tremble and returned to chair. Orthostatic BPs then taken and +   Stairs             Wheelchair Mobility     Tilt Bed    Modified Rankin (Stroke Patients Only)       Balance Overall balance assessment: Needs assistance, History of Falls Sitting-balance support: Feet supported, No upper extremity supported Sitting balance-Leahy Scale: Fair Sitting balance - Comments: sits edge of chair,   Standing balance support: Bilateral upper extremity supported Standing balance-Leahy Scale: Poor Standing balance comment: relaint on external support                            Cognition Arousal: Alert Behavior During Therapy: Flat affect Overall Cognitive Status: Impaired/Different from baseline Area of Impairment: Orientation, Memory, Following commands, Safety/judgement,  Awareness                 Orientation Level: Disoriented to, Place, Time, Situation   Memory: Decreased short-term memory Following Commands: Follows one step commands inconsistently, Follows multi-step commands inconsistently Safety/Judgement: Decreased awareness of  safety, Decreased awareness of deficits     General Comments: Minimal verbalizations, does not yes/no appropriately most of the time. Decreased short term memory. Cannot recall name of hospital <30 sec after repeating it back to therapist. Husband present and reports cognition has deteriorated (she was having issues before admission)        Exercises      General Comments General comments (skin integrity, edema, etc.): Husband present. Very appreiciative of PT interventions as he is concerned about her getting weaker while she is in the hospital.      Pertinent Vitals/Pain Pain Assessment Pain Assessment: No/denies pain    Home Living                          Prior Function            PT Goals (current goals can now be found in the care plan section) Acute Rehab PT Goals Patient Stated Goal: pt unable to state Time For Goal Achievement: 02/07/23 Potential to Achieve Goals: Fair Progress towards PT goals: Progressing toward goals (less physical assistance; limited by orthostasis)    Frequency    Min 1X/week      PT Plan      Co-evaluation              AM-PAC PT "6 Clicks" Mobility   Outcome Measure  Help needed turning from your back to your side while in a flat bed without using bedrails?: A Little Help needed moving from lying on your back to sitting on the side of a flat bed without using bedrails?: A Little Help needed moving to and from a bed to a chair (including a wheelchair)?: A Little Help needed standing up from a chair using your arms (e.g., wheelchair or bedside chair)?: A Little Help needed to walk in hospital room?: A Little Help needed climbing 3-5 steps with a railing? : Total 6 Click Score: 16    End of Session Equipment Utilized During Treatment: Gait belt Activity Tolerance: Treatment limited secondary to medical complications (Comment) (orthostasis) Patient left: with call bell/phone within reach;in chair;with chair alarm  set;with family/visitor present Nurse Communication: Mobility status;Other (comment) (orthostasis) PT Visit Diagnosis: Other abnormalities of gait and mobility (R26.89);Difficulty in walking, not elsewhere classified (R26.2);Other symptoms and signs involving the nervous system (R29.898);History of falling (Z91.81);Unsteadiness on feet (R26.81);Muscle weakness (generalized) (M62.81)     Time: 4098-1191 PT Time Calculation (min) (ACUTE ONLY): 34 min  Charges:    $Gait Training: 23-37 mins PT General Charges $$ ACUTE PT VISIT: 1 Visit                      Jerolyn Center, PT Acute Rehabilitation Services  Office [phone]    Zena Amos 02/02/2023, 11:19 AM

## 2023-02-03 ENCOUNTER — Inpatient Hospital Stay (HOSPITAL_COMMUNITY): Payer: PPO

## 2023-02-03 DIAGNOSIS — K649 Unspecified hemorrhoids: Secondary | ICD-10-CM | POA: Diagnosis not present

## 2023-02-03 DIAGNOSIS — R339 Retention of urine, unspecified: Secondary | ICD-10-CM | POA: Diagnosis not present

## 2023-02-03 DIAGNOSIS — S065XAD Traumatic subdural hemorrhage with loss of consciousness status unknown, subsequent encounter: Secondary | ICD-10-CM | POA: Diagnosis not present

## 2023-02-03 DIAGNOSIS — F0390 Unspecified dementia without behavioral disturbance: Secondary | ICD-10-CM | POA: Diagnosis not present

## 2023-02-03 DIAGNOSIS — Z515 Encounter for palliative care: Secondary | ICD-10-CM | POA: Diagnosis not present

## 2023-02-03 MED ORDER — MEMANTINE HCL 10 MG PO TABS: 10.0000 mg | ORAL_TABLET | Freq: Two times a day (BID) | ORAL | Status: DC

## 2023-02-03 MED ORDER — MEMANTINE HCL 10 MG PO TABS
5.0000 mg | ORAL_TABLET | Freq: Two times a day (BID) | ORAL | Status: DC
  Administered 2023-02-04 – 2023-02-08 (×9): 5 mg via ORAL
  Filled 2023-02-03 (×9): qty 1

## 2023-02-03 MED ORDER — RIVASTIGMINE TARTRATE 1.5 MG PO CAPS
1.5000 mg | ORAL_CAPSULE | Freq: Two times a day (BID) | ORAL | Status: DC
Start: 2023-02-04 — End: 2023-02-08
  Administered 2023-02-04 – 2023-02-08 (×9): 1.5 mg via ORAL
  Filled 2023-02-03 (×10): qty 1

## 2023-02-03 MED ORDER — SERTRALINE HCL 50 MG PO TABS
50.0000 mg | ORAL_TABLET | Freq: Every day | ORAL | Status: DC
  Administered 2023-02-04 – 2023-02-08 (×5): 50 mg via ORAL
  Filled 2023-02-03 (×5): qty 1

## 2023-02-03 NOTE — TOC Progression Note (Addendum)
Transition of Care [facility]) - Progression Note    Patient Details  Name: Anne [name] MRN: [id number] Date of Birth: 1950/09/11  Transition of Care [facility]) CM/SW Contact  Dellie Burns [place], [name] Phone Number: 02/03/2023, 9:07 AM  Clinical Narrative:  call received from Tammy with [employer] who reports request for SNF auth has been denied after peer-to-peer review. Updated pt's husband [name] who plans to appeal decision. Will provide appeal contact information to husband once denial letter received from [facility]. MD updated.  UPDATE 1100: provided [employer] denial letter for SNF to pt's husband at bedside. Pt's husband verbalized understanding of process to begin appeal and plans to call tomorrow AM. SW will provide updates as available.   Dellie Burns, MSW, LCSW [phone] (coverage)       Expected Discharge Plan: Skilled Nursing Facility Barriers to Discharge: Insurance Authorization  Expected Discharge Plan and Services In-house Referral: Clinical Social Work   Post Acute Care Choice: Skilled Nursing Facility Living arrangements for the past 2 months: Single Family Home                                       Social Determinants of Health (SDOH) Interventions SDOH Screenings   Food Insecurity: Patient Unable To Answer (01/22/2023)  Housing: Patient Unable To Answer (01/22/2023)  Transportation Needs: Patient Unable To Answer (01/22/2023)  Utilities: Patient Unable To Answer (01/22/2023)  Depression (PHQ2-9): High Risk (06/21/2022)  Financial Resource Strain: Low Risk  (09/08/2020)  Physical Activity: Unknown (10/29/2018)  Social Connections: Unknown (09/08/2020)  Stress: No Stress Concern Present (09/08/2020)  Tobacco Use: Medium Risk (01/22/2023)    Readmission Risk Interventions     No data to display

## 2023-02-03 NOTE — Progress Notes (Signed)
Chaplain returns to pt room and finds her eating dinner. She asks that chaplain return later, which chaplain agrees to do, letting her know another chaplain will take over in a few hours.

## 2023-02-03 NOTE — Plan of Care (Signed)

## 2023-02-03 NOTE — Progress Notes (Signed)
Chaplain returns to visit with pt, who struggles to communicate and seems to become distressed intermittently with labile emotions. Chaplain provides prayer, support, and encouragement.

## 2023-02-03 NOTE — Progress Notes (Signed)
HD#12 SUBJECTIVE:  Patient Summary: Anne [name] is a 72 yo patient with a history of dementia, HTN, HLD, hypothyroidism, depression, metastatic bone cancer and lung cancer who presented after seizure associated with subdural hematoma. Patient was admitted for seizure-like activity in the setting of evolving subdural hematoma.    Patient experienced an unwitnessed fall when patient attempted to leave her bed on 12/21 with a laceration of about 2.5 cm in length on her R posterior scalp s/p 2 stitches. Follow up CT imaging showed a mixed density subdural hematoma of the left cerebral hemisphere of increased thickness compared to previously identified subdural hematoma with an increased mass effect and partial effacement of the left lateral ventricle.  Overnight Events: No overnight events reported.   Interim History: No complaints today. Pt and family are distressed by recent denial for SNF.  OBJECTIVE:  Vital Signs: Vitals:   02/03/23 0409 02/03/23 0731 02/03/23 0751 02/03/23 1225  BP: 100/61 120/76 128/73 97/74  Pulse: 99 81 81 95  Resp: 17 18  18   Temp: 97.8 F (36.6 C) (!) 97.4 F (36.3 C) 97.8 F (36.6 C) (!) 97.4 F (36.3 C)  TempSrc:  Oral Oral Oral  SpO2: 91% 96% 97% 97%  Weight:      Height:       Supplemental O2: Nasal Cannula SpO2: 97 % O2 Flow Rate (L/min): 2 L/min  Filed Weights   01/23/23 1417  Weight: 73.9 kg    No intake or output data in the 24 hours ending 02/03/23 1400 Net IO Since Admission: 1,602 mL [02/03/23 1400]  Physical Exam: Physical Exam Constitutional:      General: She is not in acute distress.    Appearance: She is not ill-appearing.  HENT:     Head: Normocephalic.     Comments: Improving contusion on L face Musculoskeletal:     Right lower leg: No edema.     Left lower leg: No edema.  Skin:    General: Skin is warm.     Capillary Refill: Capillary refill takes less than 2 seconds.  Neurological:     Mental Status: She is alert.  Mental status is at baseline.  Psychiatric:        Mood and Affect: Mood normal.        Behavior: Behavior normal.     Patient Lines/Drains/Airways Status     Active Line/Drains/Airways     Name Placement date Placement time Site Days   Wound / Incision (Open or Dehisced) 10/29/18 Other (Comment) Leg Left;Posterior;Upper Boil 10/29/18  0058  Leg  1558   Wound / Incision (Open or Dehisced) 01/26/23 Laceration Head Posterior;Right;Upper laceration 01/26/23  1610  Head  8             ASSESSMENT/PLAN:  Assessment: Principal Problem:   Seizure (HCC) Active Problems:   Hypertension   Subdural hematoma (HCC)  Anne [name] is a 72 yo with history of progressive dementia who presented to [facility] ED on the morning of 01/22/2023 after a seizure and was admitted for subdural hematoma with subsequent seizure and somnolence.    Patient experienced an unwitnessed fall when patient attempted to leave her bed on 12/21 with a laceration of about 2.5 cm in length on her R posterior scalp s/p 2 stitches. Follow up CT imaging showed a mixed density subdural hematoma of the left cerebral hemisphere of increased thickness compared to previously identified subdural hematoma with an increased mass effect and partial effacement of the left  lateral ventricle.    ~She is medically stable for discharge having recovered much of her previous function and certainly has improved from the somnolence she exhibited at admission. She is able to walk about the room. She is able to participate in physical therapy and feeds herself. No further medical interventions are planned. She is considering a follow up with her neurosurgeon in the coming weeks.   Plan: Subdural Hematoma secondary to fall from standing causing Seizures and Somnolence (Resolved) New fall from hospital bed causing rebleed, scalp laceration (improving) Patient doing well, continues to improve, continues to work with physical therapy. No seizures for  many days, somnolence improved fully. She responds to prompts, answers questions with short sentences. She is oriented to person only. Appears to be at baseline this morning. SNF placement denied by insurance. Discharge home is risky given poor balance and dementia, but she has demonstrated ability to work with PT, feed self, and is making small improvements in mentation. Family plans to appeal SNF decision. She will remain here and work with PT in the meantime. May need discharge home with PT/OT/HH. Hospice premature for this patient. - CT scan today, reassess bleed. - Continue PO Keppra 100 mg q12H with IV midazolam 2 mg PRN for rescue       Dementia Continue to hold centrally acting medications. Continue to recommend family presence and orientation strategies (orienting patient to day, time, place, keeping blinds open during the day, getting meals regularly).  - Holding home memantine 10 mg BID, rivastigmine 3 mg BID, Depakote 375 mg TID, Seroquel 25 mg daily at bedtime  Hemorrhoids Examination deferred. Preparation H was given, now ordered twice daily, and appears to have relieved the patient. - Preparation H rectal ointment BID   HTN Goal SBP is below 150 per evolving subdural hematoma. Discontinued labetalol during hospitalization due to bradycardia, and avoided nitroprusside due to increased ICP. Increased home amlodipine to 10 mg and started patient on ARB. BP have been 90s-120s/50s-80s. Reduced amlodipine 10 mg to 5 mg due to lower blood pressures. Will monitor and consider increasing back to 10 mg daily dose if BP is persistently elevated.  - Amlodipine 5 mg daily, Irbesartan 300 mg daily  - IV hydralazine 2 mg q4H PRN if systolic > 150    Thrombocytopenia- resolved CBC at admission showed platelets 123. On home Depakote, which was held on admission. Platelet count WNL at 207 12/26. - Monitor    Depression/Anxiety -Held home sertraline 150 mg daily and home Wellbutrin 300 mg daily on  admission in setting of seizures. Reevaluate at follow up.    Hypothyroidism Resume synthroid 88 mcg daily.    HLD Resume rosuvastatin 20 mg daily.    Best Practice: Diet: Dys 2, thin fluids, Ensure for feeding supplement IVF: None VTE: SCDs Start: 01/22/23 1400 Code: DNR/DNI AB: None Therapy Recs: SNF DISPO: Anticipated discharge to SNF pending appeal  Signature: Katheran James, D.O.  Internal Medicine Resident, PGY-1 [facility]  Pager: [phone] 2:00 PM, 02/03/2023   Please contact the on call pager after 5 pm and on weekends at [phone].

## 2023-02-03 NOTE — Progress Notes (Signed)
Chaplain responds to page requesting spiritual support visit. Pt is in need of restroom trip, which chaplain notifies staff of. Pt and son ask that chaplain return later as she has also just received her lunch. Chaplain agrees to do so.

## 2023-02-03 NOTE — Progress Notes (Addendum)
° °  Palliative Medicine Inpatient Follow Up Note HPI: Anne [name] is a 72 y.o. 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 She presented after a seizure. Patient has had extension of subdural hematoma. There is no surgical intervention to be pursued at this point per family wishes. The PMT has been asked to get involved to continue goals of care conversations.   Today's Discussion 02/03/2023  *Please note that this is a verbal dictation therefore any spelling or grammatical errors are due to the "Dragon Medical One" system interpretation.  Chart reviewed inclusive of vital signs, progress notes, laboratory results, and diagnostic images.   I met with Anne [name] at bedside this morning. She was alert to self though not situation or place. She denies pain, shortness of breath, or nausea.   I spoke to patients husband, Anne [name] this late morning. He was helping to get Anne [name] to the bathroom with patients RN, [employer]. She was following one step directions well.   Anne [name] expresses a variety of grievances regarding patients denial from the insurance company for skilled nursing. He shares that it does not make sense from his perspective as she does have the ability to make improvement(s). I allowed him time to express his feelings.  I shared with Anne [name] that I agree that Anne [name] has made improvements though we are not the insurance agency. He is going to pursue an appeal tomorrow.   Questions and concerns addressed/Palliative Support Provided.   Objective Assessment: Vital Signs Vitals:   02/03/23 0731 02/03/23 0751  BP: 120/76 128/73  Pulse: 81 81  Resp: 18   Temp: (!) 97.4 F (36.3 C) 97.8 F (36.6 C)  SpO2: 96% 97%   No intake or  output data in the 24 hours ending 02/03/23 1130  Last Weight  Most recent update: 01/23/2023  2:24 PM    Weight  73.9 kg (162 lb 14.7 oz)            Gen:  Elderly Caucasian F in NAD HEENT: moist mucous membranes CV: Regular rate and rhythm  PULM:  On RA, breathing is even and nonlabored ABD: soft/nontender  EXT: No edema  Neuro: Alert and oriented x1  SUMMARY OF RECOMMENDATIONS   DNAR/DNI  Gold DNR on Chart    Plan for OP Palliative care follow up --> If patient declines as an OP then family aware of the ability to transition to hospice   Patient denied from SNF --> Peer to peer complete and denied  At this time patient remains to be eating and mobilizing with no significant symptom burden. IP hospice would not accept patient as of now. If were to decline further an evaluation would be within reason   Ongoing incremental PMT support  Billing based on MDM: Moderate  ______________________________________________________________________________________ Lamarr Lulas [place] Palliative Medicine Team Team Cell Phone: [phone] Please utilize secure chat with additional questions, if there is no response within 30 minutes please call the above phone number  Palliative Medicine Team providers are available by phone from 7am to 7pm daily and can be reached through the team cell phone.  Should this patient require assistance outside of these hours, please call the patient's attending physician.

## 2023-02-04 DIAGNOSIS — F0390 Unspecified dementia without behavioral disturbance: Secondary | ICD-10-CM | POA: Diagnosis not present

## 2023-02-04 DIAGNOSIS — S065XAD Traumatic subdural hemorrhage with loss of consciousness status unknown, subsequent encounter: Secondary | ICD-10-CM | POA: Diagnosis not present

## 2023-02-04 DIAGNOSIS — R339 Retention of urine, unspecified: Secondary | ICD-10-CM | POA: Diagnosis not present

## 2023-02-04 DIAGNOSIS — K649 Unspecified hemorrhoids: Secondary | ICD-10-CM | POA: Diagnosis not present

## 2023-02-04 LAB — GLUCOSE, CAPILLARY: Glucose-Capillary: 99 mg/dL (ref 70–99)

## 2023-02-04 NOTE — Plan of Care (Signed)

## 2023-02-04 NOTE — Progress Notes (Signed)
Physical Therapy Treatment Patient Details Name: Anne [name] MRN: [id number] DOB: [birth year] Today's Date: 02/04/2023   History of Present Illness Patient is a 72 year old female admitted 12/17 with new onset of seizures in the setting of underlying SDH. Fall in hospital with increased size of R SDH on CT. No change in neuro status and MD/family decided against any surgery. History of dementia, lung cancer, seizures, hypothyroid.    PT Comments  Patient agreeable to work with PT despite sleeping on arrival. Remained lethargic although able to follow commands and participate throughout session. Noted she had Keppra recently and ?cause of lethargy. Patient able to follow commands for all mobility and LE ther-ex. She was able to progress ambulation distance to 60 ft with RW and min assist. After seated rest, she took a second walk with continued assist for proximity to RW, steering RW, and balance when turning. She is making progress towards goals and remains a good rehab candidate.    If plan is discharge home, recommend the following: Supervision due to cognitive status;Help with stairs or ramp for entrance;Assist for transportation;Direct supervision/assist for medications management;Direct supervision/assist for financial management;Assistance with cooking/housework;A little help with walking and/or transfers;A little help with bathing/dressing/bathroom   Can travel by private vehicle     No  Equipment Recommendations  None recommended by PT    Recommendations for Other Services       Precautions / Restrictions Precautions Precautions: Fall Restrictions Weight Bearing Restrictions Per Provider Order: No     Mobility  Bed Mobility Overal bed mobility: Needs Assistance Bed Mobility: Supine to Sit, Sit to Supine     Supine to sit: Min assist, HOB elevated, Used rails Sit to supine: Contact guard assist   General bed mobility comments: returned to bed at end of session due to  appeared very sleepy    Transfers Overall transfer level: Needs assistance Equipment used: Rolling walker (2 wheels) Transfers: Sit to/from Stand Sit to Stand: Contact guard assist           General transfer comment: cues for hand placement and CGA for safety; no lifting assist needed    Ambulation/Gait Ambulation/Gait assistance: Min assist Gait Distance (Feet): 60 Feet (x2 with seated rest between) Assistive device: Rolling walker (2 wheels) Gait Pattern/deviations: Step-through pattern, Decreased step length - right, Decreased step length - left, Decreased stride length, Narrow base of support, Trunk flexed Gait velocity: decreased     General Gait Details: With RW, pt requires min assist intermittently for RW control/steering/proximity and balance when turning. On second walk she became very tired after ~45 feet and required min assist for balance and RW to return to bed.   Stairs             Wheelchair Mobility     Tilt Bed    Modified Rankin (Stroke Patients Only)       Balance Overall balance assessment: Needs assistance, History of Falls Sitting-balance support: Feet supported, No upper extremity supported Sitting balance-Leahy Scale: Fair Sitting balance - Comments: sits edge of chair,   Standing balance support: Single extremity supported, Bilateral upper extremity supported, During functional activity Standing balance-Leahy Scale: Poor                              Cognition Arousal: Lethargic, Suspect due to medications (recently had Keppra) Behavior During Therapy: Flat affect Overall Cognitive Status: Impaired/Different from baseline Area of Impairment: Orientation, Memory, Following  commands, Safety/judgement, Awareness                 Orientation Level: Disoriented to, Place, Time, Situation   Memory: Decreased short-term memory Following Commands: Follows multi-step commands inconsistently, Follows multi-step commands  consistently Safety/Judgement: Decreased awareness of safety, Decreased awareness of deficits     General Comments: limited verbalizations, answers yes/no questions,  following one step commands consistently        Exercises General Exercises - Lower Extremity Long Arc Quad: AROM, Both, 10 reps, Seated Hip Flexion/Marching: AROM, Both, 10 reps, Seated Toe Raises: AROM, Both, 10 reps, Seated Heel Raises: AROM, Both, 10 reps    General Comments General comments (skin integrity, edema, etc.): Husband present and very attentive. Reports pt walked "a little ways" in the hall yesterday with him and nurse.      Pertinent Vitals/Pain Pain Assessment Pain Assessment: No/denies pain    Home Living                          Prior Function            PT Goals (current goals can now be found in the care plan section) Acute Rehab PT Goals Patient Stated Goal: pt unable to state; husband wants her to return home Time For Goal Achievement: 02/07/23 Potential to Achieve Goals: Fair Progress towards PT goals: Progressing toward goals    Frequency    Min 1X/week      PT Plan      Co-evaluation              AM-PAC PT "6 Clicks" Mobility   Outcome Measure  Help needed turning from your back to your side while in a flat bed without using bedrails?: A Little Help needed moving from lying on your back to sitting on the side of a flat bed without using bedrails?: A Little Help needed moving to and from a bed to a chair (including a wheelchair)?: A Little Help needed standing up from a chair using your arms (e.g., wheelchair or bedside chair)?: A Little Help needed to walk in hospital room?: A Little Help needed climbing 3-5 steps with a railing? : Total 6 Click Score: 16    End of Session Equipment Utilized During Treatment: Gait belt Activity Tolerance: Patient limited by fatigue Patient left: with call bell/phone within reach;with family/visitor present;in  bed;with bed alarm set Nurse Communication: Mobility status PT Visit Diagnosis: Other abnormalities of gait and mobility (R26.89);Difficulty in walking, not elsewhere classified (R26.2);Other symptoms and signs involving the nervous system (R29.898);History of falling (Z91.81);Unsteadiness on feet (R26.81);Muscle weakness (generalized) (M62.81)     Time: 3474-2595 PT Time Calculation (min) (ACUTE ONLY): 28 min  Charges:    $Gait Training: 8-22 mins $Therapeutic Exercise: 8-22 mins PT General Charges $$ ACUTE PT VISIT: 1 Visit                      Jerolyn Center, PT Acute Rehabilitation Services  Office [phone]    Zena Amos 02/04/2023, 11:57 AM

## 2023-02-04 NOTE — TOC Progression Note (Signed)
Transition of Care [facility]) - Progression Note    Patient Details  Name: Anne [name] MRN: [id number] Date of Birth: 1950/03/28  Transition of [facility]) CM/SW Contact  Baldemar Lenis, [name] Phone Number: 02/04/2023, 3:43 PM  Clinical Narrative:   CSW met with patient's spouse to discuss denial and appeal. Spouse had not yet initiated the appeal, requested that CSW complete it. Appointment of representative form completed. CSW coordinated with MD and PT about reasons for denial and adding justification for SNF recommendation in documentation to support appeal. CSW attempted to reach [employer] to initiate appeal, left a message for a return call. CSW to follow.    Expected Discharge Plan: Skilled Nursing Facility Barriers to Discharge: Insurance Authorization  Expected Discharge Plan and Services In-house Referral: Clinical Social Work   Post Acute Care Choice: Skilled Nursing Facility Living arrangements for the past 2 months: Single Family Home                                       Social Determinants of Health (SDOH) Interventions SDOH Screenings   Food Insecurity: Patient Unable To Answer (01/22/2023)  Housing: Patient Unable To Answer (01/22/2023)  Transportation Needs: Patient Unable To Answer (01/22/2023)  Utilities: Patient Unable To Answer (01/22/2023)  Depression (PHQ2-9): High Risk (06/21/2022)  Financial Resource Strain: Low Risk  (09/08/2020)  Physical Activity: Unknown (10/29/2018)  Social Connections: Unknown (09/08/2020)  Stress: No Stress Concern Present (09/08/2020)  Tobacco Use: Medium Risk (01/22/2023)    Readmission Risk Interventions     No data to display

## 2023-02-04 NOTE — Progress Notes (Signed)
   Palliative Medicine Inpatient Follow Up Note HPI: Anne [name] is a 72 y.o. 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 She presented after a seizure. Patient has had extension of subdural hematoma. There is no surgical intervention to be pursued at this point per family wishes. The PMT has been asked to get involved to continue goals of care conversations.   Today's Discussion 02/04/2023  *Please note that this is a verbal dictation therefore any spelling or grammatical errors are due to the "Dragon Medical One" system interpretation.  Chart reviewed inclusive of vital signs, progress notes, laboratory results, and diagnostic images.   I met with Dahna at bedside this morning. She was alert to self, she shares the need to use the restroom. She is attentive and able to follow basic commands.   I spoke to the physical therapist this morning, Larita Fife who shares that she has noted the patient is mobilizing fairly with her. The patient is able to follow directions per her assessment, she isdentified to remain a good rehab candidate.   I spoke to patients spouse, [name] who remains hopeful for placement.  Per [facility] MSW, Lanora Manis another appeal will be made today.   Questions and concerns addressed/Palliative Support Provided.   Objective Assessment: Vital Signs Vitals:   02/04/23 1013 02/04/23 1148  BP: 107/76 113/83  Pulse:  96  Resp:  18  Temp:  (!) 96.6 F (35.9 C)  SpO2:  92%    Intake/Output Summary (Last 24 hours) at 02/04/2023 1311 Last data filed at 02/03/2023 1800 Gross per 24 hour  Intake 240 ml  Output --  Net 240 ml    Last Weight  Most recent update: 01/23/2023  2:24 PM    Weight  73.9 kg (162 lb  14.7 oz)            Gen:  Elderly Caucasian F in NAD HEENT: moist mucous membranes CV: Regular rate and rhythm  PULM:  On RA, breathing is even and nonlabored ABD: soft/nontender  EXT: No edema  Neuro: Alert and oriented x1  SUMMARY OF RECOMMENDATIONS   DNAR/DNI  Gold DNR on Chart    Plan for OP Palliative care on discharge   Patient denied from SNF --> Plan for another appeal today   Ongoing incremental PMT support  Billing based on MDM: Moderate  ______________________________________________________________________________________ Lamarr Lulas [place] Palliative Medicine Team Team Cell Phone: [phone] Please utilize secure chat with additional questions, if there is no response within 30 minutes please call the above phone number  Palliative Medicine Team providers are available by phone from 7am to 7pm daily and can be reached through the team cell phone.  Should this patient require assistance outside of these hours, please call the patient's attending physician.

## 2023-02-04 NOTE — Plan of Care (Signed)
  Problem: Clinical Measurements: Goal: Ability to maintain clinical measurements within normal limits will improve Outcome: Progressing Goal: Will remain free from infection Outcome: Progressing Goal: Respiratory complications will improve Outcome: Progressing Goal: Cardiovascular complication will be avoided Outcome: Progressing   Problem: Activity: Goal: Risk for activity intolerance will decrease Outcome: Progressing   Problem: Elimination: Goal: Will not experience complications related to bowel motility Outcome: Progressing Goal: Will not experience complications related to urinary retention Outcome: Progressing   Problem: Pain Management: Goal: General experience of comfort will improve Outcome: Progressing   Problem: Safety: Goal: Ability to remain free from injury will improve Outcome: Progressing   Problem: Skin Integrity: Goal: Risk for impaired skin integrity will decrease Outcome: Progressing   Problem: Education: Goal: Knowledge of General Education information will improve Description: Including pain rating scale, medication(s)/side effects and non-pharmacologic comfort measures Outcome: Not Progressing   Problem: Health Behavior/Discharge Planning: Goal: Ability to manage health-related needs will improve Outcome: Not Progressing   Problem: Clinical Measurements: Goal: Diagnostic test results will improve Outcome: Not Progressing   Problem: Nutrition: Goal: Adequate nutrition will be maintained Outcome: Not Progressing   Problem: Coping: Goal: Level of anxiety will decrease Outcome: Not Progressing

## 2023-02-05 ENCOUNTER — Encounter (HOSPITAL_COMMUNITY): Payer: Self-pay | Admitting: Infectious Diseases

## 2023-02-05 DIAGNOSIS — R339 Retention of urine, unspecified: Secondary | ICD-10-CM | POA: Diagnosis not present

## 2023-02-05 DIAGNOSIS — R338 Other retention of urine: Secondary | ICD-10-CM | POA: Diagnosis not present

## 2023-02-05 DIAGNOSIS — K649 Unspecified hemorrhoids: Secondary | ICD-10-CM | POA: Diagnosis not present

## 2023-02-05 DIAGNOSIS — F0390 Unspecified dementia without behavioral disturbance: Secondary | ICD-10-CM | POA: Diagnosis not present

## 2023-02-05 DIAGNOSIS — S065XAD Traumatic subdural hemorrhage with loss of consciousness status unknown, subsequent encounter: Secondary | ICD-10-CM | POA: Diagnosis not present

## 2023-02-05 MED ORDER — LACTATED RINGERS IV SOLN: INTRAVENOUS | Status: AC

## 2023-02-05 MED ORDER — ORAL CARE MOUTH RINSE: 15.0000 mL | OROMUCOSAL | Status: DC | PRN

## 2023-02-05 NOTE — Progress Notes (Addendum)
 HD#14 SUBJECTIVE:  Patient Summary: Anne [name] is a 72 yo patient with a history of dementia with depressed mood, HTN, HLD, hypothyroidism, depression, and metastatic lung cancer (in remission) who presented after a seizure associated with subdural hematoma. Patient was admitted for management of seizure in the setting of evolving subdural hematoma due to fall.    Patient experienced an unwitnessed fall when patient attempted to leave her bed on 12/21 with a laceration of about 2.5 cm in length on her R posterior scalp s/p 2 stitches. Follow up CT imaging showed a mixed density subdural hematoma of the left cerebral hemisphere of increased thickness compared to previously identified subdural hematoma with an increased mass effect and partial effacement of the left lateral ventricle.  Overnight Events: None.    Interim History: Yesterday per nursing there were some concerns of urinary retention.  She attempted to void but could not.  She however does not report any dysuria but she does report occasional lower abdominal pain.  Otherwise she is well and without complaint.  She is resting in bed this morning.  She does appear tired but it is early and she does respond to voice and touch.   She shakes her head and softly says no when asked if she has any discomfort or pain, but when asked if she has discomfort with emptying her bladder she indicates yes.  OBJECTIVE:  Vital Signs: Vitals:   02/05/23 0401 02/05/23 0812 02/05/23 1106 02/05/23 1531  BP: 101/71 118/69 128/84 (!) 81/70  Pulse: 81 76  95  Resp: 16 16 17 17   Temp: 97.8 F (36.6 C) 98.6 F (37 C) 98.7 F (37.1 C) (!) 97.3 F (36.3 C)  TempSrc:  Axillary Axillary Oral  SpO2: 95% 99% 98% 90%  Weight:      Height:       Supplemental O2: Room Air SpO2: 90 % O2 Flow Rate (L/min): 2 L/min  Filed Weights   01/23/23 1417  Weight: 73.9 kg     Intake/Output Summary (Last 24 hours) at 02/05/2023 1644 Last data filed at  02/05/2023 1300 Gross per 24 hour  Intake --  Output 551 ml  Net -551 ml   Net IO Since Admission: 1,291 mL [02/05/23 1644]  Physical Exam: Physical Exam Constitutional:      General: She is not in acute distress.    Appearance: She is not ill-appearing.  Cardiovascular:     Rate and Rhythm: Normal rate.  Pulmonary:     Effort: Pulmonary effort is normal.  Abdominal:     General: Bowel sounds are normal.     Tenderness: There is abdominal tenderness.     Comments: Mild suprapubic and epigastric tenderness. No rebound or guarding. No swelling or tympany.  Musculoskeletal:     Right lower leg: No edema.     Left lower leg: No edema.  Skin:    General: Skin is warm.  Neurological:     Mental Status: She is alert. Mental status is at baseline. She is disoriented.  Psychiatric:        Mood and Affect: Mood normal.        Behavior: Behavior normal.         ASSESSMENT/PLAN:  Assessment: Principal Problem:   Acute on chronic intracranial subdural hematoma (HCC) Active Problems:   Dementia (HCC)   Hypertension   Seizure (HCC)   Acute urinary retention  Anne [name] is a 72 yo with history of progressive dementia who presented to  [facility] ED on the morning of 01/22/2023 after a seizure and was admitted for subdural hematoma with subsequent seizure and somnolence   Anne [name] is ready for discharge. Presently I am assessing for a possible UTI but that should not hold her up unless her condition changes. Her mentation and responsiveness are steady for many days. She is able to respond to commands and participate in physical therapy. She remains as family seek SNF placement.  Plan: Subdural Hematoma secondary to fall from standing causing Seizures and Somnolence (Resolved) Previous fall from hospital bed causing rebleed, scalp laceration (improving) Status remains unchanged.  She has improved significantly from the somnolence she exhibited when she first presented.  I do not  think she is at her previous baseline as she is somewhat less communicative than before, according to family.  That said her neurologic exam has improved completely, there are no focal deficits or remaining somnolence.  She is able to respond to questions briefly and answers appropriately with nods or short phrases.  She is able to work with physical therapy and that is going well, she is following instructions. She has started advocating for herself. No seizures for many days, somnolence improved fully. She responds to prompts, answers questions with short sentences. She is oriented to person only. Dispo trouble: SNF placement denied. Discharge home is risky given poor balance and dementia, but she has demonstrated ability to work with PT, feed self, and is making small improvements in mentation. Feel that hospice is premature here, but discharge home risky. Family plans to appeal SNF decision. She will remain here and work with PT in the meantime. May ultimately be discharged home with PT/OT/HH. - CT scan 12/29 showed 1 mm increase in size of hematoma and and rightward shift, now 18 mm and 9 mm respectively.  There is also new uncal herniation present.  That said neuroexam is unchanged.  Discussed with neurosurgeon Dr. Carollee who saw her last week, he was very helpful, will not pursue surgery at this time. - Continue PO Keppra  100 mg q12H with IV midazolam  2 mg PRN for rescue    Urinary retention Urinary retention started yesterday.  Appears to be retaining about 2 and 50 mL fluid.  Does not report dysuria.  Does have some suprapubic tenderness on examination.  Mentation has not changed. - UA     Dementia Dementia is significant enough to require daily care from her husband and family.  That said she is able to respond to commands, communicate, work with physical therapy, move about the room with assistance.  Continue to hold centrally acting medications. Continue to recommend family presence and  orientation strategies (orienting patient to day, time, place, keeping blinds open during the day, getting meals regularly).  -Resume home memantine  10 mg BID, rivastigmine  3 mg BID. Hold depakote  375 mg TID, Seroquel  25 mg daily at bedtime.   Hemorrhoids Examination deferred. Preparation H was given, now ordered twice daily, and appears to have relieved the patient. - Preparation H rectal ointment BID   HTN Goal SBP is below 150 per evolving subdural hematoma. Discontinued labetalol  during hospitalization due to bradycardia, and avoided nitroprusside due to increased ICP. Increased home amlodipine  to 10 mg and started patient on ARB. BP have been 90s-120s/50s-80s. Reduced amlodipine  10 mg to 5 mg due to lower blood pressures. Will monitor and consider increasing back to 10 mg daily dose if BP is persistently elevated.  - Amlodipine  5 mg daily, Irbesartan  300 mg daily  -  IV hydralazine  2 mg q4H PRN if systolic > 150    Thrombocytopenia- resolved CBC at admission showed platelets 123. On home Depakote , which was held on admission. Platelet count WNL at 207 12/26. - Monitor    Depression/Anxiety -Held home sertraline  150 mg daily and home Wellbutrin  300 mg daily on admission in setting of seizures. Reevaluate at follow up.    Hypothyroidism Resume synthroid  88 mcg daily.    HLD Resume rosuvastatin  20 mg daily.   Best Practice: Diet: Dys 2, thin fluids, Ensure for feeding supplement IVF: None VTE: SCDs Start: 01/22/23 1400 Code: DNR/DNI AB: None Therapy Recs: SNF DISPO: Anticipated discharge to SNF pending appeal  Signature: Lonni Africa, D.O.  Internal Medicine Resident, PGY-1 [facility]  Pager: [phone] 4:44 PM, 02/05/2023   Please contact the on call pager after 5 pm and on weekends at [phone].

## 2023-02-05 NOTE — Progress Notes (Signed)
   Palliative Medicine Inpatient Follow Up Note HPI: Anne [name] is a 72 y.o. 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 She presented after a seizure. Patient has had extension of subdural hematoma. There is no surgical intervention to be pursued at this point per family wishes. The PMT has been asked to get involved to continue goals of care conversations.   Today's Discussion 02/05/2023  *Please note that this is a verbal dictation therefore any spelling or grammatical errors are due to the Dragon Medical One system interpretation.  Chart reviewed inclusive of vital signs, progress notes, laboratory results, and diagnostic images.   I met with Anne [name] at bedside this morning she remains alert to self. She is able to follow basic directions and denies pain, shortness of breath, or nausea.   No family is present at bedside though as of presently the plan is to appeal insurance so that patient may go to skilled nursing.   Questions and concerns addressed/Palliative Support Provided.   Objective Assessment: Vital Signs Vitals:   02/05/23 0401 02/05/23 0812  BP: 101/71 118/69  Pulse: 81 76  Resp: 16 16  Temp: 97.8 F (36.6 C) 98.6 F (37 C)  SpO2: 95% 99%    Intake/Output Summary (Last 24 hours) at 02/05/2023 1056 Last data filed at 02/05/2023 0107 Gross per 24 hour  Intake --  Output 550 ml  Net -550 ml    Last Weight  Most recent update: 01/23/2023  2:24 PM    Weight  73.9 kg (162 lb 14.7 oz)            Gen:  Elderly Caucasian F in NAD HEENT: moist mucous membranes CV: Regular rate and rhythm  PULM:  On RA, breathing is even and nonlabored ABD: soft/nontender  EXT: No edema  Neuro: Alert and  oriented x1  SUMMARY OF RECOMMENDATIONS   DNAR/DNI  Gold DNR on Chart    Plan for OP Palliative care on discharge   Patient denied from SNF --> Appeal filed by MSW   The PMT will sign off at this time. Pls do not hesitate to re-consult our service if additional needs arise  Billing based on MDM: Low  ______________________________________________________________________________________ Rosaline Becton [facility] Palliative Medicine Team Team Cell Phone: [phone] Please utilize secure chat with additional questions, if there is no response within 30 minutes please call the above phone number  Palliative Medicine Team providers are available by phone from 7am to 7pm daily and can be reached through the team cell phone.  Should this patient require assistance outside of these hours, please call the patient's attending physician.

## 2023-02-05 NOTE — TOC Progression Note (Signed)
 Transition of Care [facility]) - Progression Note    Patient Details  Name: Anne [name] MRN: [id number] Date of Birth: 1950-05-18  Transition [facility] CM/SW Contact  Anne [name], [name] Phone Number: 02/05/2023, 3:04 PM  Clinical Narrative:   CSW contacted [employer] to again ask about the appeal. CSW was able to initiate fast appeal over the phone at 11:54 am, confirmed fax number and sent in clinicals to support. Reference number is [id number]. [employer] has 72 hours to respond to request. CSW to follow.    Expected Discharge Plan: Skilled Nursing Facility Barriers to Discharge: Insurance Authorization  Expected Discharge Plan and Services In-house Referral: Clinical Social Work   Post Acute Care Choice: Skilled Nursing Facility Living arrangements for the past 2 months: Single Family Home                                       Social Determinants of Health (SDOH) Interventions SDOH Screenings   Food Insecurity: Patient Unable To Answer (01/22/2023)  Housing: Patient Unable To Answer (01/22/2023)  Transportation Needs: Patient Unable To Answer (01/22/2023)  Utilities: Patient Unable To Answer (01/22/2023)  Depression (PHQ2-9): High Risk (06/21/2022)  Financial Resource Strain: Low Risk  (09/08/2020)  Physical Activity: Unknown (10/29/2018)  Social Connections: Unknown (09/08/2020)  Stress: No Stress Concern Present (09/08/2020)  Tobacco Use: Medium Risk (01/22/2023)    Readmission Risk Interventions     No data to display

## 2023-02-06 DIAGNOSIS — R339 Retention of urine, unspecified: Secondary | ICD-10-CM | POA: Diagnosis not present

## 2023-02-06 DIAGNOSIS — S065XAD Traumatic subdural hemorrhage with loss of consciousness status unknown, subsequent encounter: Secondary | ICD-10-CM | POA: Diagnosis not present

## 2023-02-06 DIAGNOSIS — K649 Unspecified hemorrhoids: Secondary | ICD-10-CM | POA: Diagnosis not present

## 2023-02-06 DIAGNOSIS — F0390 Unspecified dementia without behavioral disturbance: Secondary | ICD-10-CM | POA: Diagnosis not present

## 2023-02-06 LAB — URINALYSIS, ROUTINE W REFLEX MICROSCOPIC
Bacteria, UA: NONE SEEN
Bilirubin Urine: NEGATIVE
Glucose, UA: NEGATIVE mg/dL
Hgb urine dipstick: NEGATIVE
Ketones, ur: NEGATIVE mg/dL
Nitrite: NEGATIVE
Protein, ur: NEGATIVE mg/dL
Specific Gravity, Urine: 1.02 (ref 1.005–1.030)
pH: 6 (ref 5.0–8.0)

## 2023-02-06 LAB — BASIC METABOLIC PANEL
Anion gap: 9 (ref 5–15)
BUN: 37 mg/dL — ABNORMAL HIGH (ref 8–23)
CO2: 23 mmol/L (ref 22–32)
Calcium: 8.9 mg/dL (ref 8.9–10.3)
Chloride: 101 mmol/L (ref 98–111)
Creatinine, Ser: 0.9 mg/dL (ref 0.44–1.00)
GFR, Estimated: >60 mL/min (ref >60)
Glucose, Bld: 88 mg/dL (ref 70–99)
Potassium: 4.1 mmol/L (ref 3.5–5.1)
Sodium: 133 mmol/L — ABNORMAL LOW (ref 135–145)

## 2023-02-06 LAB — CBC
HCT: 39.9 % (ref 36.0–46.0)
Hemoglobin: 13.2 g/dL (ref 12.0–15.0)
MCH: 30.1 pg (ref 26.0–34.0)
MCHC: 33.1 g/dL (ref 30.0–36.0)
MCV: 91.1 fL (ref 80.0–100.0)
Platelets: 162 10*3/uL (ref 150–400)
RBC: 4.38 MIL/uL (ref 3.87–5.11)
RDW: 14.7 % (ref 11.5–15.5)
WBC: 9.2 10*3/uL (ref 4.0–10.5)
nRBC: 0 % (ref 0.0–0.2)

## 2023-02-06 LAB — GLUCOSE, CAPILLARY
Glucose-Capillary: 104 mg/dL — ABNORMAL HIGH (ref 70–99)
Glucose-Capillary: 102 mg/dL — ABNORMAL HIGH (ref 70–99)

## 2023-02-06 NOTE — Plan of Care (Signed)

## 2023-02-06 NOTE — Plan of Care (Signed)
   Problem: Education: Goal: Knowledge of General Education information will improve Description: Including pain rating scale, medication(s)/side effects and non-pharmacologic comfort measures Outcome: Progressing   Problem: Clinical Measurements: Goal: Ability to maintain clinical measurements within normal limits will improve Outcome: Progressing Goal: Will remain free from infection Outcome: Progressing

## 2023-02-06 NOTE — Progress Notes (Addendum)
 HD#15 SUBJECTIVE:  Patient Summary: Anne [name] is a 73 yo patient with a history of dementia with depressed mood, HTN, HLD, hypothyroidism, and metastatic lung cancer (in remission) who presented after a seizure associated with subdural hematoma. Patient was admitted for management of seizure in the setting of evolving subdural hematoma due to fall.    Patient experienced an unwitnessed fall when patient attempted to leave her bed on 12/21 with a laceration of about 2.5 cm in length on her R posterior scalp s/p 2 stitches. Follow up CT imaging showed a mixed density subdural hematoma of the left cerebral hemisphere of increased thickness compared to previously identified subdural hematoma with an increased mass effect and partial effacement of the left lateral ventricle.  Overnight Events: None.   Interim History: Administered IV fluids yesterday for some low blood pressures and decreased skin turgor.  Urine for urinalysis to investigate possibility of UTI was not obtained as ordered.   Denies acute complaints. Following commands. Asked for a blanket because she was sold.  Husband reports that she has been eating well and that to his knowledge she has had no difficulties with emptying her bladder today.  OBJECTIVE:  Vital Signs: Vitals:   02/05/23 2100 02/06/23 0020 02/06/23 0349 02/06/23 1129  BP: 99/72 107/76 (!) 98/58 118/69  Pulse: 100 95 87 87  Resp:  17 17 16   Temp: 98.4 F (36.9 C) 97.6 F (36.4 C) 98.1 F (36.7 C) 98.3 F (36.8 C)  TempSrc: Oral     SpO2: 98% 95% 94% 95%  Weight:      Height:       Supplemental O2: Room Air SpO2: 95 % O2 Flow Rate (L/min): 2 L/min  Filed Weights   01/23/23 1417  Weight: 73.9 kg    Physical Exam: Physical Exam Constitutional:      General: She is not in acute distress, lying still in bed.    Appearance: She is not ill-appearing but appears very tired, flat affect.  Cardiovascular:     Rate and Rhythm: Normal rate.  Pulmonary:      Effort: Pulmonary effort is normal.  Abdominal:     General: Bowel sounds are normal.     Tenderness: There is no abdominal tenderness; suprapubic tenderness noted yesterday is not present.     Musculoskeletal:     Right lower leg: No edema.     Left lower leg: No edema.  Skin:    General: Skin is warm. Well healing contusion on L side of face.  Skin turgor is reduced generally. Neurological:     Mental Status: She is sleepy (husband reports she's been sleeping more) but awakens to remain alert and engages with the examiner during our visit. Today she could not recall her own name. She shook her head and closed her eyes in apparent frustration after saying I don't know.  Pupils are equal and gaze is conjugate with full extraocular movements.  She is able to flex her hips and knees and wiggle her toes.  Raised her arms symmetrically and exhibited asymmetric grip strength.  She did not seem able to hold her right leg off the bed against gravity, if she indeed understood the instruction. Psychiatric:        Mood and Affect: Affect is flat, not expressive.  She seems withdrawn.    Behavior: Behavior normal.   Patient Lines/Drains/Airways Status     Active Line/Drains/Airways     Name Placement date Placement time Site Days  Peripheral IV 02/05/23 22 G Distal;Posterior;Right Forearm 02/05/23  1835  Forearm  1   Wound / Incision (Open or Dehisced) 10/29/18 Other (Comment) Leg Left;Posterior;Upper Boil 10/29/18  0058  Leg  1561   Wound / Incision (Open or Dehisced) 01/26/23 Laceration Head Posterior;Right;Upper laceration 01/26/23  1610  Head  11             ASSESSMENT/PLAN:  Assessment: Principal Problem:   Acute on chronic intracranial subdural hematoma (HCC) Active Problems:   Hypertension   Dementia (HCC)   Seizure (HCC)   Acute urinary retention  Anne [name] is a 73 yo with history of progressive dementia who presented to [facility] ED on the morning of 01/22/2023 after a  seizure and was admitted for subdural hematoma with subsequent seizure and somnolence    Anne [name] is ready for discharge. Presently I am assessing for a possible UTI but that should not hold her up unless her condition changes. Her mentation and responsiveness are steady for many days. She is able to respond to commands and participate in physical therapy. She remains as family seek SNF placement.   Plan: Subdural Hematoma secondary to fall from standing causing Seizures and Somnolence (Resolved) Previous fall from hospital bed causing rebleed, scalp laceration (improving) Mental status remains grossly stable.  She has improved significantly from the somnolence she exhibited when she first presented.  I do not think she is at her previous baseline as she is less communicative than before, according to family.  That said her neurologic exam has improved completely, there are no focal deficits or remaining somnolence.  She is able to respond to questions briefly and answers appropriately with nods or short phrases.  She is able to work with physical therapy and that is going well, she is following instructions. She has started advocating for herself. No seizures for many days, somnolence improved fully. She responds to prompts, answers questions with short sentences. She is oriented to person only. Dispo trouble: SNF placement denied. Discharge home is risky given poor balance and dementia, but she has demonstrated ability to work with PT, feed self, and is making small improvements in mentation. Feel that hospice is premature here, but discharge home risky. Family is appealing SNF decision. She will remain here and work with PT in the meantime. May ultimately be discharged home with PT/OT/HH. - CT scan 12/29 showed 1 mm increase in size of hematoma and and rightward shift, now 18 mm and 9 mm respectively.  There is also new uncal herniation present.  That said neuroexam is unchanged.  Discussed with  neurosurgeon Dr. Carollee who saw her last week, he was very helpful, will not pursue surgery at this time. - Continue PO Keppra  100 mg q12H with IV midazolam  2 mg PRN for rescue    Urinary retention Urinary retention started yesterday but appears to have resolved.  Appears to be retaining about 250 mL fluid.  Does not report dysuria.  Yesterday had suprapubic tenderness on exam but now resolved.  Mentation has not changed. - UA pending     Dementia Dementia is significant enough to require daily care from her husband and family.  That said she is able to respond to commands, communicate, work with physical therapy, move about the room with assistance.    Continue to recommend family presence and orientation strategies (orienting patient to day, time, place, keeping blinds open during the day, getting meals regularly).  Memantine  and rivastigmine  have been resumed, beginning at lower  doses with plan to titrate up to home dose. Depakote  and Seroquel  home meds have not been need and should not be continued.   Hemorrhoids Examination deferred. Preparation H was given, now ordered twice daily, and appears to have relieved the patient. - Preparation H rectal ointment BID   HTN Goal SBP is below 150 per evolving subdural hematoma. Blood pressures have required decreasing meds/doses and at this point we are stopping them to prevent hypotension. - Hold Amlodipine  5 mg daily, Irbesartan  300 mg daily given low BPs    Thrombocytopenia- resolved Holding home Depakote . Platelet count WNL at 207 12/26. - Monitor    Depression/Anxiety Bupropion  has been stopped indefinitely due to seizures.  Sertraline  has been resumed from lower dose with goal of titrating up to at least 100 mg.  Hypothyroidism Resume synthroid  88 mcg daily. She has appeared overcorrected recently and TSH should be repeated.     HLD Resume rosuvastatin  20 mg daily, though continuation should be done with goals in mind.  If goals are  for quality of life rather than longevity, a discussion about stopping would be appropriate.    Best Practice: Diet: Dys 2, thin fluids, Ensure for feeding supplement IVF: prn VTE: SCDs Start: 01/22/23 1400 Code: DNR/DNI AB: None Therapy Recs: SNF DISPO: Anticipated discharge to SNF pending appeal  Signature: Lonni Africa, D.O.  Internal Medicine Resident, PGY-1 [facility]  Pager: [phone] 1:05 PM, 02/06/2023   Please contact the on call pager after 5 pm and on weekends at [phone].

## 2023-02-07 ENCOUNTER — Encounter: Payer: Self-pay | Admitting: Oncology

## 2023-02-07 DIAGNOSIS — I6201 Nontraumatic acute subdural hemorrhage: Secondary | ICD-10-CM | POA: Diagnosis not present

## 2023-02-07 DIAGNOSIS — I6203 Nontraumatic chronic subdural hemorrhage: Secondary | ICD-10-CM

## 2023-02-07 NOTE — Progress Notes (Addendum)
 Physical Therapy Treatment Patient Details Name: Anne [name] MRN: [id number] DOB: 11-Aug-1950 Today's Date: 02/07/2023   History of Present Illness Patient is a 73 year old female admitted 12/17 with new onset of seizures in the setting of underlying SDH. Fall in hospital with increased size of R SDH on CT. No change in neuro status and MD/family decided against any surgery. History of dementia, lung cancer, seizures, hypothyroid.    PT Comments  Pt seated in recliner.  Family came to desk and reports concern for her staying in chair when he leaves.  Assisted patient back to bed for safety.  Pt continues to require min assistance overall.      If plan is discharge home, recommend the following: Supervision due to cognitive status;Help with stairs or ramp for entrance;Assist for transportation;Direct supervision/assist for medications management;Direct supervision/assist for financial management;Assistance with cooking/housework;A little help with walking and/or transfers;A little help with bathing/dressing/bathroom   Can travel by private vehicle     No  Equipment Recommendations  None recommended by PT    Recommendations for Other Services       Precautions / Restrictions Precautions Precautions: Fall Precaution Comments: Systolic blood pressure goal below 150; watch for orthostasis Restrictions Weight Bearing Restrictions Per Provider Order: No     Mobility  Bed Mobility Overal bed mobility: Needs Assistance Bed Mobility: Sit to Supine     Supine to sit: Min assist Sit to supine: Contact guard assist   General bed mobility comments: Pt able to follow commands to return to bed and position into bed.    Transfers Overall transfer level: Needs assistance Equipment used: Rolling walker (2 wheels) Transfers: Sit to/from Stand Sit to Stand: Contact guard assist Stand pivot transfers: Min assist         General transfer comment: cues for hand placement and CGA for  safety; no lifting assist needed.  Pt performed steps from chair  to bed.  Repeated sit to stand as she would not take side steps to the L.    Ambulation/Gait                   Stairs             Wheelchair Mobility     Tilt Bed    Modified Rankin (Stroke Patients Only)       Balance Overall balance assessment: Needs assistance, History of Falls Sitting-balance support: Feet supported, No upper extremity supported Sitting balance-Leahy Scale: Fair       Standing balance-Leahy Scale: Poor Standing balance comment: Mild instability but no overt LOB.                            Cognition Arousal: Lethargic, Suspect due to medications Behavior During Therapy: Flat affect Overall Cognitive Status: Impaired/Different from baseline Area of Impairment: Orientation, Memory, Following commands, Safety/judgement, Awareness                 Orientation Level: Disoriented to, Place, Time, Situation   Memory: Decreased short-term memory Following Commands: Follows multi-step commands inconsistently, Follows multi-step commands consistently Safety/Judgement: Decreased awareness of safety, Decreased awareness of deficits     General Comments: limited verbalizations, answers yes/no questions,  following one step commands consistently        Exercises General Exercises - Lower Extremity Ankle Circles/Pumps: AROM, Both, 10 reps, Supine Quad Sets: AROM, Both, 10 reps, Supine Heel Slides: AROM, Both, 10 reps, Supine Hip ABduction/ADduction: AROM,  Both, 10 reps, Supine    General Comments        Pertinent Vitals/Pain Pain Assessment Pain Assessment: No/denies pain Pain Intervention(s): Monitored during session, Repositioned    Home Living                          Prior Function            PT Goals (current goals can now be found in the care plan section) Acute Rehab PT Goals Patient Stated Goal: unable to state Potential to  Achieve Goals: Fair Progress towards PT goals: Progressing toward goals    Frequency    Min 1X/week      PT Plan      Co-evaluation              AM-PAC PT 6 Clicks Mobility   Outcome Measure  Help needed turning from your back to your side while in a flat bed without using bedrails?: A Little Help needed moving from lying on your back to sitting on the side of a flat bed without using bedrails?: A Little Help needed moving to and from a bed to a chair (including a wheelchair)?: A Little Help needed standing up from a chair using your arms (e.g., wheelchair or bedside chair)?: A Little Help needed to walk in hospital room?: A Little Help needed climbing 3-5 steps with a railing? : Total 6 Click Score: 16    End of Session Equipment Utilized During Treatment: Gait belt Activity Tolerance: Patient limited by fatigue Patient left: with call bell/phone within reach;with family/visitor present;in bed;with bed alarm set Nurse Communication: Mobility status PT Visit Diagnosis: Other abnormalities of gait and mobility (R26.89);Difficulty in walking, not elsewhere classified (R26.2);Other symptoms and signs involving the nervous system (R29.898);History of falling (Z91.81);Unsteadiness on feet (R26.81);Muscle weakness (generalized) (M62.81)     Time: 8386-8376 PT Time Calculation (min) (ACUTE ONLY): 10 min  Charges:     $Therapeutic Activity: 8-22 mins PT General Charges $$ ACUTE PT VISIT: 1 Visit                     Toya HAMS , PTA Acute Rehabilitation Services Office [phone]    Toya JINNY Gosling 02/07/2023, 4:29 PM

## 2023-02-07 NOTE — Plan of Care (Signed)

## 2023-02-07 NOTE — Progress Notes (Addendum)
 HD#16 SUBJECTIVE:  Patient Summary: Anne [name] is a 73 yo patient with a history of dementia with depressed mood, HTN, HLD, hypothyroidism, depression, and metastatic lung cancer (in remission) who presented after a seizure associated with subdural hematoma. Patient was admitted for management of seizure in the setting of evolving subdural hematoma due to fall.    Patient experienced an unwitnessed fall when patient attempted to leave her bed on 12/21 with a laceration of about 2.5 cm in length on her R posterior scalp s/p 2 stitches. Follow up CT imaging showed a mixed density subdural hematoma of the left cerebral hemisphere of increased thickness compared to previously identified subdural hematoma with an increased mass effect and partial effacement of the left lateral ventricle.  Overnight Events: None  Interim History: She is resting in bed. She arouses very easily. When awoken, she responds to questions with shakes of the head.  She denies discomfort.  She responds to all prompts.  OBJECTIVE:  Vital Signs: Vitals:   02/06/23 1630 02/06/23 1918 02/06/23 2314 02/07/23 0353  BP: 92/64 105/78 90/61 121/73  Pulse: 93 92 84 87  Resp: 16 16 14 16   Temp: 98 F (36.7 C) 97.9 F (36.6 C) (!) 97.5 F (36.4 C) 97.8 F (36.6 C)  TempSrc:   Oral Oral  SpO2: 91% 94% 92% 94%  Weight:      Height:       Supplemental O2: Room Air SpO2: 94 % O2 Flow Rate (L/min): 2 L/min  Filed Weights   01/23/23 1417  Weight: 73.9 kg     Intake/Output Summary (Last 24 hours) at 02/07/2023 0736 Last data filed at 02/06/2023 2144 Gross per 24 hour  Intake 120 ml  Output --  Net 120 ml   Net IO Since Admission: 2,687.25 mL [02/07/23 0736]   Physical Exam: Physical Exam Constitutional:      General: She is not in acute distress, lying still in bed.    Appearance: She is not ill-appearing but appears very tired, flat affect.  Cardiovascular:     Rate and Rhythm: Normal rate.  Pulmonary:      Effort: Pulmonary effort is normal.  Abdominal:     General: Bowel sounds are normal.     Tenderness: There is no abdominal tenderness; suprapubic tenderness noted yesterday is not present.     Musculoskeletal:     Right lower leg: No edema.     Left lower leg: No edema.  Skin:    General: Skin is warm. Well healing contusion on L side of face.  Skin turgor is reduced generally. Neurological:     Mental Status: She is sleepy (husband reports she's been sleeping more) but awakens to remain alert and engages with the examiner during our visit. Pupils are equal and gaze is conjugate with full extraocular movements.  She is able to flex her hips and knees and wiggle her toes.   Psychiatric:        Mood and Affect: Affect is flat, not expressive.  She seems withdrawn.    Behavior: Behavior normal.   Patient Lines/Drains/Airways Status     Active Line/Drains/Airways     Name Placement date Placement time Site Days   Peripheral IV 02/05/23 22 G Distal;Posterior;Right Forearm 02/05/23  1835  Forearm  2   Wound / Incision (Open or Dehisced) 10/29/18 Other (Comment) Leg Left;Posterior;Upper Boil 10/29/18  0058  Leg  1562   Wound / Incision (Open or Dehisced) 01/26/23 Laceration Head Posterior;Right;Upper laceration  01/26/23  1610  Head  12             ASSESSMENT/PLAN:  Assessment: Principal Problem:   Acute on chronic intracranial subdural hematoma (HCC) Active Problems:   Hypertension   Dementia (HCC)   Seizure (HCC)   Acute urinary retention  Anne [name] is a 73 yo with history of progressive dementia who presented to [facility] ED on the morning of 01/22/2023 after a seizure and was admitted for subdural hematoma with subsequent seizure and somnolence    Anne [name] is ready for discharge. Her mentation and responsiveness are steady for many days. She is able to respond to commands and participate in physical therapy. She remains as family seek SNF placement.   Plan: Subdural  Hematoma secondary to fall from standing causing Seizures and Somnolence (Resolved) Previous fall from hospital bed causing rebleed, scalp laceration (improving) Mental status remains grossly stable, albeit more tired lately.  She has repeatedly expressed a desire to leave the hospital.  Nevertheless, has improved significantly from the somnolence she exhibited when she first presented.  I do not think she is at her previous baseline as she is less communicative than before.   - EXAM: She is able to respond to questions briefly and answers appropriately with nods or short phrases.  She is able to work with physical therapy and that is going well, she is following instructions. She has started advocating for herself. No seizures for many days, somnolence improved fully. She is oriented to person only. No focal deficits. - Dispo trouble: SNF placement denied. Discharge home is risky given poor balance and dementia, but she has demonstrated ability to work with PT, feed self, and is making small improvements in mentation. Feel that hospice is premature here, but discharge home risky. Family is appealing SNF decision. She will remain here and work with PT in the meantime. May ultimately be discharged home with PT/OT/HH. - CT scan 12/29 showed 1 mm increase in size of hematoma and and rightward shift, now 18 mm and 9 mm respectively.  There is also new uncal herniation present.  That said neuroexam is unchanged.  Discussed with neurosurgeon Anne [name] who saw her last week, he was very helpful, will not pursue surgery at this time. - Continue PO Keppra  100 mg q12H with IV midazolam  2 mg PRN for rescue    Urinary retention Urinary retention started 12/30 but appears to have resolved.  Appears to be retaining about 250 mL fluid.  Does not report dysuria.  Previously had suprapubic tenderness on exam but now resolved.  Mentation has not changed. - UA unremarkable     Dementia Dementia is significant enough to  require daily care from her husband and family.  That said she is able to respond to commands, communicate, work with physical therapy, move about the room with assistance.    Continue to recommend family presence and orientation strategies (orienting patient to day, time, place, keeping blinds open during the day, getting meals regularly).  Memantine  and rivastigmine  have been resumed, beginning at lower doses with plan to titrate up to home dose. Depakote  and Seroquel  home meds have not been need and should not be continued.   Hemorrhoids Examination deferred. Preparation H was given, now ordered twice daily, and appears to have relieved the patient. - Preparation H rectal ointment BID   HTN Goal SBP is below 150 per evolving subdural hematoma. Blood pressures have required decreasing meds/doses and at this point we are stopping  them to prevent hypotension. - Hold Amlodipine  5 mg daily, Irbesartan  300 mg daily given low BPs    Thrombocytopenia- resolved Holding home Depakote . Platelet count WNL at 207 12/26. - Monitor    Depression/Anxiety Bupropion  has been stopped indefinitely due to seizures.  Sertraline  has been resumed from lower dose with goal of titrating up to at least 100 mg.   Hypothyroidism Resume synthroid  88 mcg daily. She has appeared overcorrected recently and TSH should be repeated.     HLD Resume rosuvastatin  20 mg daily, though continuation should be done with goals in mind.  If goals are for quality of life rather than longevity, a discussion about stopping would be appropriate.    Best Practice: Diet: Dys 2, thin fluids, Ensure for feeding supplement IVF: prn VTE: SCDs Start: 01/22/23 1400 Code: DNR/DNI AB: None Therapy Recs: SNF DISPO: Anticipated discharge to SNF pending appeal  Signature: Lonni Africa, D.O.  Internal Medicine Resident, PGY-1 [facility]  Pager: [phone] 7:36 AM, 02/07/2023   Please contact the on  call pager after 5 pm and on weekends at [phone].

## 2023-02-07 NOTE — Progress Notes (Addendum)
 Physical Therapy Treatment Patient Details Name: LATESE DUFAULT MRN: [id number] DOB: 08/04/1950 Today's Date: 02/07/2023   History of Present Illness Patient is a 73 year old female admitted 12/17 with new onset of seizures in the setting of underlying SDH. Fall in hospital with increased size of R SDH on CT. No change in neuro status and MD/family decided against any surgery. History of dementia, lung cancer, seizures, hypothyroid.    PT Comments  Pt supine in bed on arrival.  Pt continues to require min assistance overall.  Pt sleepy this session.  Husband remains to be insistent on needing help at home. She remains to benefit from rehab in a post acute setting.    If plan is discharge home, recommend the following:     Can travel by private vehicle        Equipment Recommendations       Recommendations for Other Services       Precautions / Restrictions Precautions Precautions: Fall Precaution Comments: Systolic blood pressure goal below 150; watch for orthostasis Restrictions Weight Bearing Restrictions Per Provider Order: No     Mobility  Bed Mobility Overal bed mobility: Needs Assistance Bed Mobility: Supine to Sit, Sit to Supine     Supine to sit: Min assist     General bed mobility comments: Assistance to elevate trunk into sitting, Pt slow and guarded.  Required frequent repeated verbal cues due to alertness.    Transfers Overall transfer level: Needs assistance Equipment used: Rolling walker (2 wheels) Transfers: Sit to/from Stand, Bed to chair/wheelchair/BSC Sit to Stand: Contact guard assist Stand pivot transfers: Min assist         General transfer comment: cues for hand placement and CGA for safety; no lifting assist needed.  Pt performed steps from bed to chair as she refused ambulation due to drowsiness.    Ambulation/Gait                   Stairs             Wheelchair Mobility     Tilt Bed    Modified Rankin (Stroke  Patients Only)       Balance Overall balance assessment: Needs assistance, History of Falls Sitting-balance support: Feet supported, No upper extremity supported Sitting balance-Leahy Scale: Fair       Standing balance-Leahy Scale: Poor Standing balance comment: Mild instability but no overt LOB.                            Cognition Arousal: Lethargic, Suspect due to medications Behavior During Therapy: Flat affect Overall Cognitive Status: Impaired/Different from baseline Area of Impairment: Orientation, Memory, Following commands, Safety/judgement, Awareness                 Orientation Level: Disoriented to, Place, Time, Situation   Memory: Decreased short-term memory Following Commands: Follows multi-step commands inconsistently, Follows multi-step commands consistently Safety/Judgement: Decreased awareness of safety, Decreased awareness of deficits     General Comments: limited verbalizations, answers yes/no questions,  following one step commands consistently        Exercises General Exercises - Lower Extremity Ankle Circles/Pumps: AROM, Both, 10 reps, Supine Quad Sets: AROM, Both, 10 reps, Supine Heel Slides: AROM, Both, 10 reps, Supine Hip ABduction/ADduction: AROM, Both, 10 reps, Supine    General Comments        Pertinent Vitals/Pain Pain Assessment Pain Assessment: No/denies pain Pain Intervention(s): Monitored during session, Repositioned  Home Living                          Prior Function            PT Goals (current goals can now be found in the care plan section) Acute Rehab PT Goals Patient Stated Goal: unable to state Potential to Achieve Goals: Fair Progress towards PT goals: Progressing toward goals    Frequency           PT Plan      Co-evaluation              AM-PAC PT 6 Clicks Mobility   Outcome Measure                   End of Session Equipment Utilized During Treatment:  Gait belt Activity Tolerance: Patient limited by fatigue Patient left: with call bell/phone within reach;with family/visitor present;in bed;with bed alarm set         Time: 1300-1330 PT Time Calculation (min) (ACUTE ONLY): 30 min  Charges:    $Therapeutic Exercise: 8-22 mins $Therapeutic Activity: 8-22 mins PT General Charges $$ ACUTE PT VISIT: 1 Visit                     Toya HAMS , PTA Acute Rehabilitation Services Office [phone]    Jen Eppinger JINNY Gosling 02/07/2023, 1:49 PM

## 2023-02-07 NOTE — Telephone Encounter (Signed)
 Error

## 2023-02-08 DIAGNOSIS — Z9181 History of falling: Secondary | ICD-10-CM | POA: Diagnosis not present

## 2023-02-08 DIAGNOSIS — S065XAA Traumatic subdural hemorrhage with loss of consciousness status unknown, initial encounter: Secondary | ICD-10-CM | POA: Diagnosis not present

## 2023-02-08 DIAGNOSIS — R569 Unspecified convulsions: Secondary | ICD-10-CM | POA: Diagnosis not present

## 2023-02-08 LAB — GLUCOSE, CAPILLARY
Glucose-Capillary: 91 mg/dL (ref 70–99)
Glucose-Capillary: 126 mg/dL — ABNORMAL HIGH (ref 70–99)

## 2023-02-08 MED ORDER — ORAL CARE MOUTH RINSE
15.0000 mL | OROMUCOSAL | Status: DC
  Administered 2023-02-08 – 2023-02-18 (×36): 15 mL via OROMUCOSAL

## 2023-02-08 MED ORDER — MEMANTINE HCL 10 MG PO TABS
10.0000 mg | ORAL_TABLET | Freq: Two times a day (BID) | ORAL | Status: DC
  Administered 2023-02-08 – 2023-02-18 (×20): 10 mg via ORAL
  Filled 2023-02-08 (×20): qty 1

## 2023-02-08 MED ORDER — ORAL CARE MOUTH RINSE
15.0000 mL | OROMUCOSAL | Status: DC | PRN
Start: 2023-02-08 — End: 2023-02-18

## 2023-02-08 MED ORDER — RIVASTIGMINE TARTRATE 1.5 MG PO CAPS
3.0000 mg | ORAL_CAPSULE | Freq: Two times a day (BID) | ORAL | Status: DC
  Administered 2023-02-08 – 2023-02-18 (×21): 3 mg via ORAL
  Filled 2023-02-08 (×22): qty 2

## 2023-02-08 MED ORDER — POLYETHYLENE GLYCOL 3350 17 G PO PACK
17.0000 g | PACK | Freq: Every day | ORAL | Status: DC
  Administered 2023-02-08 – 2023-02-09 (×2): 17 g via ORAL
  Filled 2023-02-08 (×2): qty 1

## 2023-02-08 MED ORDER — SERTRALINE HCL 100 MG PO TABS
100.0000 mg | ORAL_TABLET | Freq: Every day | ORAL | Status: DC
  Administered 2023-02-09 – 2023-02-14 (×6): 100 mg via ORAL
  Filled 2023-02-08 (×6): qty 1

## 2023-02-08 NOTE — Progress Notes (Signed)
 Physical Therapy Treatment Patient Details Name: Anne [name] MRN: [id number] DOB: March 04, 1950 Today's Date: 02/08/2023   History of Present Illness Patient is a 73 year old female admitted 12/17 with new onset of seizures in the setting of underlying SDH. Fall in hospital with increased size of R SDH on CT. No change in neuro status and MD/family decided against any surgery. History of dementia, lung cancer, seizures, hypothyroid.    PT Comments  Pt resting in bed on arrival, pleasant and participatory in session. Pt following all simple single step commands with some repetition needed due to [facility]. Pt requiring min A to come to sit EOB with pt reaching for HHA to elevate trunk to sitting. Pt able to come to stand x2 from EOB with CGA and low commode with light min A. Pt demonstrating gait with grossly min A to manage RW as pt with some R drift and bumping obstacles on R. Pt continues to be limited by decreased activity tolerance, weakness and impaired balance/postural reactions and current plan remains appropriate to address deficits and maximize functional independence and decrease caregiver burden. Pt continues to benefit from skilled PT services to progress toward functional mobility goals.     If plan is discharge home, recommend the following: Supervision due to cognitive status;Help with stairs or ramp for entrance;Assist for transportation;Direct supervision/assist for medications management;Direct supervision/assist for financial management;Assistance with cooking/housework;A little help with walking and/or transfers;A little help with bathing/dressing/bathroom   Can travel by private vehicle     No  Equipment Recommendations  None recommended by PT    Recommendations for Other Services       Precautions / Restrictions Precautions Precautions: Fall Precaution Comments: Systolic blood pressure goal below 150; watch for orthostasis Restrictions Weight Bearing Restrictions Per Provider  Order: No     Mobility  Bed Mobility Overal bed mobility: Needs Assistance Bed Mobility: Sit to Supine, Supine to Sit     Supine to sit: Min assist Sit to supine: Contact guard assist   General bed mobility comments: light min A to elevate trunk with pt reaching out for asssit, CGA to retrun to bed with pt able to follow commands for squencing    Transfers Overall transfer level: Needs assistance Equipment used: Rolling walker (2 wheels) Transfers: Sit to/from Stand Sit to Stand: Contact guard assist, Min assist           General transfer comment: cues for hand placement and CGA for safety; no lifting assist needed from EOB, light min A to steady on rise from low commode    Ambulation/Gait Ambulation/Gait assistance: Min assist Gait Distance (Feet): 50 Feet (15 + 35) Assistive device: Rolling walker (2 wheels) Gait Pattern/deviations: Step-through pattern, Decreased step length - right, Decreased step length - left, Decreased stride length, Narrow base of support, Trunk flexed Gait velocity: decreased     General Gait Details: With RW, pt requires min assist intermittently for RW control/steering/proximity and balance when turning, some R drift noted and catching RW on obstacles on R. Distance limited by fatigue   Stairs             Wheelchair Mobility     Tilt Bed    Modified Rankin (Stroke Patients Only)       Balance Overall balance assessment: Needs assistance, History of Falls Sitting-balance support: Feet supported, No upper extremity supported Sitting balance-Leahy Scale: Fair     Standing balance support: Single extremity supported, Bilateral upper extremity supported, During functional activity Standing balance-Leahy  Scale: Poor Standing balance comment: Mild instability but no overt LOB.                            Cognition Arousal: Alert Behavior During Therapy: Flat affect Overall Cognitive Status: Impaired/Different from  baseline Area of Impairment: Orientation, Memory, Following commands, Safety/judgement, Awareness                 Orientation Level: Disoriented to, Place, Time, Situation   Memory: Decreased short-term memory Following Commands: Follows multi-step commands inconsistently, Follows multi-step commands consistently Safety/Judgement: Decreased awareness of safety, Decreased awareness of deficits     General Comments: limited verbalizations, answers yes/no questions,  following one step commands consistently, HOH        Exercises      General Comments General comments (skin integrity, edema, etc.): VSS      Pertinent Vitals/Pain Pain Assessment Pain Assessment: No/denies pain Pain Intervention(s): Monitored during session    Home Living                          Prior Function            PT Goals (current goals can now be found in the care plan section) Acute Rehab PT Goals PT Goal Formulation: With patient Time For Goal Achievement: 02/07/23 Progress towards PT goals: Progressing toward goals    Frequency    Min 1X/week      PT Plan      Co-evaluation              AM-PAC PT 6 Clicks Mobility   Outcome Measure  Help needed turning from your back to your side while in a flat bed without using bedrails?: A Little Help needed moving from lying on your back to sitting on the side of a flat bed without using bedrails?: A Little Help needed moving to and from a bed to a chair (including a wheelchair)?: A Little Help needed standing up from a chair using your arms (e.g., wheelchair or bedside chair)?: A Little Help needed to walk in hospital room?: A Little Help needed climbing 3-5 steps with a railing? : Total 6 Click Score: 16    End of Session Equipment Utilized During Treatment: Gait belt Activity Tolerance: Patient limited by fatigue Patient left: with call bell/phone within reach;in bed;with bed alarm set Nurse Communication:  Mobility status PT Visit Diagnosis: Other abnormalities of gait and mobility (R26.89);Difficulty in walking, not elsewhere classified (R26.2);Other symptoms and signs involving the nervous system (R29.898);History of falling (Z91.81);Unsteadiness on feet (R26.81);Muscle weakness (generalized) (M62.81)     Time: 9044-8988 PT Time Calculation (min) (ACUTE ONLY): 16 min  Charges:    $Gait Training: 8-22 mins PT General Charges $$ ACUTE PT VISIT: 1 Visit                     Reana Chacko R. PTA Acute Rehabilitation Services Office: [phone]   Therisa CHRISTELLA Boor 02/08/2023, 11:21 AM

## 2023-02-08 NOTE — TOC Progression Note (Signed)
 Transition of Care [facility]) - Progression Note    Patient Details  Name: Anne [name] MRN: [id number] Date of Birth: 21-Feb-1950  Transition of Care [facility]) CM/SW Contact  Almarie CHRISTELLA Goodie, [name] Phone Number: 02/08/2023, 10:17 AM  Clinical Narrative:   CSW received call from [facility] that the denial was upheld. CSW coordinated with PT about SNF denial, to see if SNF was still recommendation based on patient's abilities. PT saw patient and is continuing to recommend SNF, spouse at bedside saying this is not the patient's baseline and he cannot take her home. Per [employer] representative, appeal has gone to a second level review with [employer], and CSW should receive a response back on that appeal process within 72 hours.  CSW met with spouse at bedside to provide update and discuss other options. Patient will still need placement, will await for results of second level appeal. CSW discussed filing for Medicaid for coverage of LTC SNF, and spouse in agreement. CSW sent request to Financial Counseling to speak with him to file application for facility Medicaid.   CSW updated daughter, Butler, who was appreciative of update. Per Butler, patient's spouse is no longer communicating with her, so she is unable to assist moving forward but would appreciate the update on discharge when able. CSW to follow.    Expected Discharge Plan: Skilled Nursing Facility Barriers to Discharge: Insurance Authorization  Expected Discharge Plan and Services In-house Referral: Clinical Social Work   Post Acute Care Choice: Skilled Nursing Facility Living arrangements for the past 2 months: Single Family Home                                       Social Determinants of Health (SDOH) Interventions SDOH Screenings   Food Insecurity: Patient Unable To Answer (01/22/2023)  Housing: Patient Unable To Answer (01/22/2023)  Transportation Needs: Patient Unable To Answer (01/22/2023)  Utilities: Patient  Unable To Answer (01/22/2023)  Depression (PHQ2-9): High Risk (06/21/2022)  Financial Resource Strain: Low Risk  (09/08/2020)  Physical Activity: Unknown (10/29/2018)  Social Connections: Unknown (02/06/2023)  Stress: No Stress Concern Present (09/08/2020)  Tobacco Use: Medium Risk (01/22/2023)    Readmission Risk Interventions     No data to display

## 2023-02-08 NOTE — TOC Progression Note (Signed)
 Transition of Care [facility]) - Progression Note    Patient Details  Name: Anne [name] MRN: [id number] Date of Birth: November 11, 1950  Transition of Care [facility]) CM/SW Contact  Almarie CHRISTELLA Goodie, [name] Phone Number: 02/08/2023, 10:49 AM  Clinical Narrative:   CSW received update from [place] with financial counseling that he will attempt to complete Medicaid workup with spouse today. CSW started to check on finding a SNF bed for patient for LTC with Medicaid pending. [facility], which was previously selected, does not currently have any LTC beds available, but will update CSW if a bed becomes available. [facility], [facility], and [facility] had previously offered, but have no LTC beds available. [facility] will consider depending on financial counseling workup and whether an LOG is offered. CSW to follow.    Expected Discharge Plan: Skilled Nursing Facility Barriers to Discharge: Insurance Authorization, SNF Pending Medicaid, SNF Pending bed offer, SNF Pending payor source - LOG  Expected Discharge Plan and Services In-house Referral: Clinical Social Work   Post Acute Care Choice: Skilled Nursing Facility Living arrangements for the past 2 months: Single Family Home                                       Social Determinants of Health (SDOH) Interventions SDOH Screenings   Food Insecurity: Patient Unable To Answer (01/22/2023)  Housing: Patient Unable To Answer (01/22/2023)  Transportation Needs: Patient Unable To Answer (01/22/2023)  Utilities: Patient Unable To Answer (01/22/2023)  Depression (PHQ2-9): High Risk (06/21/2022)  Financial Resource Strain: Low Risk  (09/08/2020)  Physical Activity: Unknown (10/29/2018)  Social Connections: Unknown (02/06/2023)  Stress: No Stress Concern Present (09/08/2020)  Tobacco Use: Medium Risk (01/22/2023)    Readmission Risk Interventions     No data to display

## 2023-02-08 NOTE — Plan of Care (Signed)

## 2023-02-08 NOTE — Progress Notes (Signed)
 HD#17 SUBJECTIVE:  Patient Summary: Anne [name] is a 73 yo patient with a history of dementia with depressed mood, HTN, HLD, hypothyroidism, and metastatic lung cancer (in remission) who presented after a seizure associated with subdural hematoma. Patient was admitted for management of seizure in the setting of evolving subdural hematoma due to fall.    Patient experienced an unwitnessed fall when patient attempted to leave her bed on 12/21 with a laceration of about 2.5 cm in length on her R posterior scalp s/p 2 stitches. Follow up CT imaging showed a mixed density subdural hematoma of the left cerebral hemisphere of increased thickness compared to previously identified subdural hematoma with an increased mass effect and partial effacement of the left lateral ventricle.   Overnight Events: None.   Interim History: Patient continues to wait for appropriate patient placement.  Appeal to SNF was denied by insurance.  Social work is attempting to get patient into long-term care facility and get her set up with Medicaid.  This morning she is at her approximate baseline.  She is reactive.  She denies pain.  She does express sadness.  She does express a strong desire to go home.  She answers with nods and a few words.  OBJECTIVE:  Vital Signs: Vitals:   02/08/23 0026 02/08/23 0439 02/08/23 0827 02/08/23 1136  BP: (!) 90/57 123/73 109/83 124/80  Pulse: 94 84 79 88  Resp: 18 18 17 18   Temp: 97.6 F (36.4 C)  (!) 97.5 F (36.4 C) 97.8 F (36.6 C)  TempSrc: Oral  Oral Oral  SpO2: 94% 94% 98% 98%  Weight:      Height:       Supplemental O2: Room Air SpO2: 98 % O2 Flow Rate (L/min): 2 L/min  Filed Weights   01/23/23 1417  Weight: 73.9 kg     Intake/Output Summary (Last 24 hours) at 02/08/2023 1435 Last data filed at 02/08/2023 9062 Gross per 24 hour  Intake 383 ml  Output --  Net 383 ml   Net IO Since Admission: 3,070.25 mL [02/08/23 1435]  Physical Exam: Physical  Exam Constitutional:      General: She is not in acute distress, lying still in bed.    Appearance: She is not ill-appearing but appears very tired, flat affect.  Cardiovascular:     Rate and Rhythm: Normal rate.  Pulmonary:     Effort: Pulmonary effort is normal.  Abdominal:     General: Bowel sounds are normal.     Tenderness: There is no abdominal tenderness; suprapubic tenderness noted yesterday is not present.     Musculoskeletal:     Right lower leg: No edema.     Left lower leg: No edema.  Skin:    General: Skin is warm. Well healing contusion on L side of face.  Skin turgor is reduced generally. Neurological:     Mental Status: She demonstrates purposeful movements. She is sleepy (husband reports she's been sleeping more) but awakens to remain alert and engages with the examiner during our visit.  Pupils are equal and gaze is conjugate with full extraocular movements.  She is able to flex her hips and knees and wiggle her toes.  Raised her arms symmetrically and exhibited asymmetric grip strength.  Psychiatric:        Mood and Affect: Affect is flat, not expressive.  She seems withdrawn.    Behavior: Behavior normal.   Patient Lines/Drains/Airways Status     Active Line/Drains/Airways  Name Placement date Placement time Site Days   Peripheral IV 02/05/23 22 G Distal;Posterior;Right Forearm 02/05/23  1835  Forearm  3   Wound / Incision (Open or Dehisced) 10/29/18 Other (Comment) Leg Left;Posterior;Upper Boil 10/29/18  0058  Leg  1563   Wound / Incision (Open or Dehisced) 01/26/23 Laceration Head Posterior;Right;Upper laceration 01/26/23  1610  Head  13             ASSESSMENT/PLAN:  Assessment: Principal Problem:   Acute on chronic intracranial subdural hematoma (HCC) Active Problems:   Hypertension   Dementia (HCC)   Seizure (HCC)   Acute urinary retention  Anne [name] is a 73 yo with history of progressive dementia who presented to [facility] ED on the morning of  01/22/2023 after a seizure and was admitted for subdural hematoma with subsequent seizure and somnolence    [name] is ready for discharge. Presently I am assessing for a possible UTI but that should not hold her up unless her condition changes. Her mentation and responsiveness are steady for many days. She is able to respond to commands and participate in physical therapy. She remains as family seek SNF placement.   Plan: Subdural Hematoma secondary to fall from standing causing Seizures and Somnolence (Resolved) Previous fall from hospital bed causing rebleed, scalp laceration (improving) Mental status remains grossly stable.  She has improved significantly from the somnolence she exhibited when she first presented.  I do not think she is at her previous baseline as she is less communicative than before, according to family.  That said her neurologic exam has improved completely, there are no focal deficits or remaining somnolence.  She is able to respond to questions briefly and answers appropriately with nods or short phrases.  She is able to work with physical therapy and that is going well, she is following instructions. She has started advocating for herself. No seizures for many days, somnolence improved fully. She responds to prompts, answers questions with short sentences. She is oriented to person only. - Dispo is a challenge: SNF placement denied, appeal denied. Discharge home is risky given poor balance and dementia. She has demonstrated ability to work with PT, feed self, and is making small improvements in mentation. Feel that hospice is premature here. She will remain here and work with PT in the meantime. May ultimately be discharged home with PT/OT/HH. But first, social is attempting to get her medicaid and LTC placement. - CT scan 12/29 showed 1 mm increase in size of hematoma and and rightward shift, now 18 mm and 9 mm respectively.  There is also new uncal herniation present.  That  said neuroexam is unchanged.  Discussed with neurosurgeon Dr. Carollee who saw her last week, he was very helpful, will not pursue surgery at this time. - Continue PO Keppra  100 mg q12H with IV midazolam  2 mg PRN for rescue    Urinary retention Urinary retention started yesterday but appears to have resolved.  Appears to be retaining about 250 mL fluid.  Does not report dysuria.  Yesterday had suprapubic tenderness on exam but now resolved.  Mentation has not changed. - UA unremarkable     Dementia Dementia is significant enough to require daily care from her husband and family.  That said she is able to respond to commands, communicate, work with physical therapy, move about the room with assistance.  Continue to recommend family presence and orientation strategies (orienting patient to day, time, place, keeping blinds open during the  day, getting meals regularly).  - Memantine  and rivastigmine  have been resumed, beginning at lower doses with plan to titrate up to home dose. Depakote  and Seroquel  home meds have not been need and should not be continued.   Hemorrhoids Examination deferred. Preparation H was given, now ordered twice daily, and appears to have relieved the patient. - Preparation H rectal ointment BID   HTN Goal SBP is below 150 per evolving subdural hematoma. Blood pressures have required decreasing meds/doses and at this point we are stopping them to prevent hypotension. - Hold Amlodipine  5 mg daily, Irbesartan  300 mg daily given low BPs    Thrombocytopenia- resolved Holding home Depakote . Platelet count WNL at 207 12/26. - Monitor    Depression/Anxiety Bupropion  has been stopped indefinitely due to seizures.  Sertraline  has been resumed from lower dose with goal of titrating up to at least 100 mg.   Hypothyroidism Resume synthroid  88 mcg daily. She has appeared overcorrected recently and TSH should be repeated.     HLD Resume rosuvastatin  20 mg daily, though continuation  should be done with goals in mind.  If goals are for quality of life rather than longevity, a discussion about stopping would be appropriate.    Best Practice: Diet: Dys 2, thin fluids, Ensure for feeding supplement IVF: prn VTE: SCDs Start: 01/22/23 1400 Code: DNR/DNI AB: None Therapy Recs: SNF DISPO: Anticipated discharge to SNF pending appeal  Signature: Lonni Africa, D.O.  Internal Medicine Resident, PGY-1 [facility]  Pager: [phone] 2:35 PM, 02/08/2023   Please contact the on call pager after 5 pm and on weekends at [phone].

## 2023-02-08 NOTE — Progress Notes (Signed)
 Occupational Therapy Treatment Patient Details Name: Anne [name] MRN: [id number] DOB: [birth year] Today's Date: 02/08/2023   History of present illness Patient is a 73 year old female admitted 12/17 with new onset of seizures in the setting of underlying SDH. Fall in hospital with increased size of R SDH on CT. No change in neuro status and MD/family decided against any surgery. History of dementia, lung cancer, seizures, hypothyroid.   OT comments  Pt making steady progress towards OT goals this session. Pt continues to present with impaired cognition, impaired balance and decreased activity tolerance . Session focus on BADL reeducation, ADL transfers and increasing overall activity tolerance. Pt currently requires MIN A for ADL transfers with RW, and MAX A for 3/3 toileting tasks, continent urine void during session. Pt with R inattention and impaired motor planning in RUE when completing UE assessments. Pt required MAX cues to complete hand to nose test on R side and demonstrate opposition as well as wrist supination/pronation. Pt conversing minimally but participatory in session. Pt would continue to benefit from skilled occupational therapy while admitted and after d/c to address the below listed limitations in order to improve overall functional mobility and facilitate independence with BADL participation. DC plan remains appropriate, will follow acutely per POC.         If plan is discharge home, recommend the following:  A lot of help with walking and/or transfers;A lot of help with bathing/dressing/bathroom   Equipment Recommendations  BSC/3in1    Recommendations for Other Services      Precautions / Restrictions Precautions Precautions: Fall Precaution Comments: Systolic blood pressure goal below 150; watch for orthostasis Restrictions Weight Bearing Restrictions Per Provider Order: No       Mobility Bed Mobility Overal bed mobility: Needs Assistance Bed Mobility: Supine  to Sit     Supine to sit: Min assist     General bed mobility comments: pt reaching out for assist to elevate trunk    Transfers Overall transfer level: Needs assistance Equipment used: Rolling walker (2 wheels) Transfers: Sit to/from Stand, Bed to chair/wheelchair/BSC Sit to Stand: Min assist, From elevated surface Stand pivot transfers: Min assist         General transfer comment: pt able to rise from [facility] and EOB with MINA  needing cues for hand placement and assist to rise. MIN A to pivot needing cues to initate sequential stepping and manage RW, pt did sit prematurely d/t dizziness     Balance Overall balance assessment: Needs assistance, History of Falls Sitting-balance support: Feet supported, No upper extremity supported Sitting balance-Leahy Scale: Fair     Standing balance support: Single extremity supported, Bilateral upper extremity supported, During functional activity Standing balance-Leahy Scale: Poor Standing balance comment: Mild instability but no overt LOB.                           ADL either performed or assessed with clinical judgement   ADL Overall ADL's : Needs assistance/impaired             Lower Body Bathing: Maximal assistance;Sit to/from stand Lower Body Bathing Details (indicate cue type and reason): simulated via pericare         Toilet Transfer: Minimal assistance;Stand-pivot;BSC/3in1;Rolling walker (2 wheels) Toilet Transfer Details (indicate cue type and reason): MIN A for stand pivot to BSC, pt sitting prematurely d/t reports of dizziness however unsure of validity of statement d/t cog Toileting- Clothing Manipulation and Hygiene: Maximal assistance;Sit  to/from stand Toileting - Architect Details (indicate cue type and reason): for anterior pericare after bladder void     Functional mobility during ADLs: Minimal assistance;Rolling walker (2 wheels);Cueing for safety;Cueing for sequencing General ADL  Comments: ADL participation impacted by impaired cog, balance and activity tolerance    Extremity/Trunk Assessment Upper Extremity Assessment Upper Extremity Assessment: RUE deficits/detail RUE Deficits / Details: full AROM, impaired motor planning RUE Sensation: WNL RUE Coordination: decreased fine motor;decreased gross motor   Lower Extremity Assessment Lower Extremity Assessment: Defer to PT evaluation        Vision Baseline Vision/History: 0 No visual deficits (per gross assessment)     Perception Perception Perception: Impaired Preception Impairment Details: Inattention/Neglect Perception-Other Comments: R inattention   Praxis Praxis Praxis: Impaired Praxis Impairment Details: Motor planning    Cognition Arousal: Alert, Lethargic Behavior During Therapy: Flat affect Overall Cognitive Status: History of cognitive impairments - at baseline Area of Impairment: Orientation, Attention, Memory, Following commands, Safety/judgement, Awareness, Problem solving                 Orientation Level: Disoriented to, Place, Time, Situation Current Attention Level: Focused Memory: Decreased short-term memory Following Commands: Follows one step commands inconsistently, Follows multi-step commands inconsistently Safety/Judgement: Decreased awareness of safety, Decreased awareness of deficits Awareness: Intellectual Problem Solving: Slow processing, Decreased initiation, Difficulty sequencing, Requires verbal cues, Requires tactile cues General Comments: limited verbalizations, answers specific questions i.e are you dizzy? etc, very HOH, impaired awareness, R inattention        Exercises Other Exercises Other Exercises: issued pt compliant cube to assist with RUE FMC/strength and attention to R side    Shoulder Instructions       General Comments husband present during session    Pertinent Vitals/ Pain       Pain Assessment Pain Assessment: No/denies pain  Home  Living                                          Prior Functioning/Environment              Frequency  Min 1X/week        Progress Toward Goals  OT Goals(current goals can now be found in the care plan section)  Progress towards OT goals: Progressing toward goals (gradually)  Acute Rehab OT Goals OT Goal Formulation: Patient unable to participate in goal setting Time For Goal Achievement: 02/07/23 Potential to Achieve Goals: Fair  Plan      Co-evaluation                 AM-PAC OT 6 Clicks Daily Activity     Outcome Measure   Help from another person eating meals?: A Little Help from another person taking care of personal grooming?: A Little Help from another person toileting, which includes using toliet, bedpan, or urinal?: A Lot Help from another person bathing (including washing, rinsing, drying)?: A Lot Help from another person to put on and taking off regular upper body clothing?: A Lot Help from another person to put on and taking off regular lower body clothing?: A Lot 6 Click Score: 14    End of Session Equipment Utilized During Treatment: Gait belt;Rolling walker (2 wheels);Other (comment) (BSC)  OT Visit Diagnosis: Unsteadiness on feet (R26.81);Other abnormalities of gait and mobility (R26.89);Muscle weakness (generalized) (M62.81)   Activity Tolerance Patient tolerated treatment well  Patient Left in chair;with call bell/phone within reach;with chair alarm set;Other (comment) (safety belt alarm)   Nurse Communication Mobility status;Other (comment) (via secure chat)        Time: 8681-8658 OT Time Calculation (min): 23 min  Charges: OT General Charges $OT Visit: 1 Visit OT Treatments $Self Care/Home Management : 23-37 mins  Ronal Mallie POUR., COTA/L Acute Rehabilitation Services [phone]  Ronal Mallie Needy 02/08/2023, 1:58 PM

## 2023-02-09 DIAGNOSIS — I6203 Nontraumatic chronic subdural hemorrhage: Secondary | ICD-10-CM | POA: Diagnosis not present

## 2023-02-09 LAB — GLUCOSE, CAPILLARY
Glucose-Capillary: 100 mg/dL — ABNORMAL HIGH (ref 70–99)
Glucose-Capillary: 131 mg/dL — ABNORMAL HIGH (ref 70–99)

## 2023-02-09 MED ORDER — SENNOSIDES-DOCUSATE SODIUM 8.6-50 MG PO TABS
1.0000 | ORAL_TABLET | Freq: Once | ORAL | Status: AC
  Administered 2023-02-09: 1 via ORAL
  Filled 2023-02-09: qty 1

## 2023-02-09 MED ORDER — POLYETHYLENE GLYCOL 3350 17 G PO PACK
17.0000 g | PACK | Freq: Two times a day (BID) | ORAL | Status: DC
  Administered 2023-02-09 – 2023-02-18 (×12): 17 g via ORAL
  Filled 2023-02-09 (×15): qty 1

## 2023-02-09 MED ORDER — BISACODYL 10 MG RE SUPP
10.0000 mg | Freq: Every day | RECTAL | Status: DC | PRN
  Administered 2023-02-09: 10 mg via RECTAL
  Filled 2023-02-09: qty 1

## 2023-02-09 NOTE — Progress Notes (Addendum)
 HD#18 SUBJECTIVE:  Patient Summary: Anne [name] is a 73 yo patient with a history of dementia with depressed mood, HTN, HLD, hypothyroidism, and metastatic lung cancer (in remission) who presented after a seizure associated with subdural hematoma. Patient was admitted for management of seizure in the setting of evolving subdural hematoma due to fall.    Patient experienced an unwitnessed fall when patient attempted to leave her bed on 12/21 with a laceration of about 2.5 cm in length on her R posterior scalp s/p 2 stitches. Follow up CT imaging showed a mixed density subdural hematoma of the left cerebral hemisphere of increased thickness compared to previously identified subdural hematoma with an increased mass effect and partial effacement of the left lateral ventricle.   Overnight Events: None.   Interim History: Patient continues to wait for appropriate patient placement.  Appeal to SNF was denied by insurance.  Social work is attempting to get patient into long-term care facility and get her set up with Medicaid.  This morning she is sleeping, however per nursing reports she has had multiple attempts to get out of bed. On recheck examination, she is awake and alert and has no complaints.   OBJECTIVE:  Vital Signs: Vitals:   02/08/23 2007 02/09/23 0004 02/09/23 0526 02/09/23 0826  BP: 132/83 125/80 132/85 102/66  Pulse: 93 83 73 79  Resp: 18 18 19 16   Temp: (!) 97.5 F (36.4 C) (!) 97.5 F (36.4 C) 97.8 F (36.6 C) 97.7 F (36.5 C)  TempSrc: Oral Oral Oral Axillary  SpO2: 94% 98% 97% 98%  Weight:      Height:       Supplemental O2: Room Air   Filed Weights   01/23/23 1417  Weight: 73.9 kg     Intake/Output Summary (Last 24 hours) at 02/09/2023 0910 Last data filed at 02/08/2023 2315 Gross per 24 hour  Intake 819 ml  Output --  Net 819 ml   Net IO Since Admission: 3,626.25 mL [02/09/23 0910]  Physical Exam: Physical Exam Constitutional:      General: She is not  in acute distress, lying still in bed.    Appearance: She is not ill-appearing but appears very tired, flat affect.  Cardiovascular:     Rate and Rhythm: Normal rate.  Pulmonary:     Effort: Pulmonary effort is normal.  Abdominal:     General: Bowel sounds are normal.     Tenderness: There is no abdominal tenderness; suprapubic tenderness noted yesterday is not present.     Musculoskeletal:     Right lower leg: No edema.     Left lower leg: No edema.  Skin:    General: Skin is warm. Well healing contusion on L side of face.  Skin turgor is reduced generally. Neurological:     Mental Status: She demonstrates purposeful movements. She is able to follow commands, and able to respond to verbal cues with no hesitation. Strength exam 3/5 in both upper and lower extremities, consistent with her hospital course.  Psychiatric:        Mood and Affect: Affect is flat, not expressive.  She seems withdrawn.    Behavior: Behavior normal.   Patient Lines/Drains/Airways Status     Active Line/Drains/Airways     Name Placement date Placement time Site Days   Peripheral IV 02/05/23 22 G Distal;Posterior;Right Forearm 02/05/23  1835  Forearm  3   Wound / Incision (Open or Dehisced) 10/29/18 Other (Comment) Leg Left;Posterior;Upper Boil 10/29/18  0058  Leg  1563   Wound / Incision (Open or Dehisced) 01/26/23 Laceration Head Posterior;Right;Upper laceration 01/26/23  1610  Head  13             ASSESSMENT/PLAN:  Assessment: Principal Problem:   Acute on chronic intracranial subdural hematoma (HCC) Active Problems:   Hypertension   Dementia (HCC)   Seizure (HCC)   Acute urinary retention  Anne [name] is a 73 yo with history of progressive dementia who presented to [facility] ED on the morning of 01/22/2023 after a seizure and was admitted for subdural hematoma with subsequent seizure and somnolence    Anne [name] is ready for discharge. Presently I am assessing for a possible UTI but that should  not hold her up unless her condition changes. Her mentation and responsiveness are steady for many days. She is able to respond to commands and participate in physical therapy. She remains as family seek SNF placement.   Plan: Subdural Hematoma secondary to fall from standing causing Seizures and Somnolence (Resolved) Previous fall from hospital bed causing rebleed, scalp laceration (improving) Mental status remains grossly stable.  She has improved significantly from the somnolence she exhibited when she first presented.  I do not think she is at her previous baseline as she is less communicative than before, according to family.  That said her neurologic exam has improved completely, there are no focal deficits or remaining somnolence.  She is able to respond to questions briefly and answers appropriately with nods or short phrases.  She is able to work with physical therapy and that is going well, she is following instructions. She has started advocating for herself. No seizures for many days, somnolence improved fully. She responds to prompts, answers questions with short sentences. She is oriented to person only. - Dispo is a challenge: SNF placement denied, appeal denied. Discharge home is risky given poor balance and dementia. She has demonstrated ability to work with PT, feed self, and is making small improvements in mentation. Feel that hospice is premature here. She will remain here and work with PT in the meantime. May ultimately be discharged home with PT/OT/HH. But first, social is attempting to get her medicaid and LTC placement. No update as of yet. - CT scan 12/29 showed 1 mm increase in size of hematoma and and rightward shift, now 18 mm and 9 mm respectively.  There is also new uncal herniation present.  That said neuroexam is unchanged.  Discussed with neurosurgeon Dr. Carollee who saw her last week, he was very helpful, will not pursue surgery at this time. - Continue PO Keppra  100 mg q12H  with IV midazolam  2 mg PRN for rescue    Urinary retention Urinary retention started yesterday but appears to have resolved.  Appears to be retaining about 250 mL fluid.  Does not report dysuria.  Yesterday had suprapubic tenderness on exam but now resolved.  Mentation has not changed. - UA unremarkable     Dementia Dementia is significant enough to require daily care from her husband and family.  That said she is able to respond to commands, communicate, work with physical therapy, move about the room with assistance.  Continue to recommend family presence and orientation strategies (orienting patient to day, time, place, keeping blinds open during the day, getting meals regularly).  - Memantine  and rivastigmine  have been resumed, beginning at lower doses with plan to titrate up to home dose. Depakote  and Seroquel  home meds have not been need and should not  be continued.   Hemorrhoids Examination deferred. Preparation H was given, now ordered twice daily, and appears to have relieved the patient. - Preparation H rectal ointment BID   HTN Goal SBP is below 150 per evolving subdural hematoma. Blood pressures have required decreasing meds/doses and at this point we are stopping them to prevent hypotension. - Hold Amlodipine  5 mg daily, Irbesartan  300 mg daily given low BPs    Thrombocytopenia- resolved Holding home Depakote . Platelet count WNL at 207 12/26. - Monitor    Depression/Anxiety Bupropion  has been stopped indefinitely due to seizures.  Sertraline  has been resumed from lower dose with goal of titrating up to at least 100 mg.   Hypothyroidism Resume synthroid  88 mcg daily. She has appeared overcorrected recently and TSH should be repeated.     HLD Resume rosuvastatin  20 mg daily, though continuation should be done with goals in mind.  If goals are for quality of life rather than longevity, a discussion about stopping would be appropriate.    Best Practice: Diet: Dys 2, thin  fluids, Ensure for feeding supplement IVF: prn VTE: SCDs Start: 01/22/23 1400 Code: DNR/DNI AB: None Therapy Recs: SNF DISPO: Anticipated discharge to SNF pending appeal  Signature: Roetta Chars, MD Internal Medicine Resident, PGY-2 [facility]  Pager: [phone] 9:10 AM, 02/09/2023   Please contact the on call pager after 5 pm and on weekends at [phone].

## 2023-02-09 NOTE — Plan of Care (Signed)

## 2023-02-10 DIAGNOSIS — I6201 Nontraumatic acute subdural hemorrhage: Secondary | ICD-10-CM | POA: Diagnosis not present

## 2023-02-10 DIAGNOSIS — I6203 Nontraumatic chronic subdural hemorrhage: Secondary | ICD-10-CM | POA: Diagnosis not present

## 2023-02-10 LAB — GLUCOSE, CAPILLARY
Glucose-Capillary: 79 mg/dL (ref 70–99)
Glucose-Capillary: 102 mg/dL — ABNORMAL HIGH (ref 70–99)
Glucose-Capillary: 105 mg/dL — ABNORMAL HIGH (ref 70–99)

## 2023-02-10 NOTE — Progress Notes (Addendum)
 HD#19 SUBJECTIVE:  Patient Summary: Anne [name] is a 73 yo patient with a history of dementia with depressed mood, HTN, HLD, hypothyroidism, and metastatic lung cancer (in remission) who presented after a seizure associated with subdural hematoma. Patient was admitted for management of seizure in the setting of evolving subdural hematoma due to fall.    Patient experienced an unwitnessed fall when patient attempted to leave her bed on 12/21 with a laceration of about 2.5 cm in length on her R posterior scalp s/p 2 stitches. Follow up CT imaging showed a mixed density subdural hematoma of the left cerebral hemisphere of increased thickness compared to previously identified subdural hematoma with an increased mass effect and partial effacement of the left lateral ventricle.   Overnight Events: None.   Interim History: Patient continues to wait for appropriate patient placement.  Appeal to SNF was denied by insurance.  Social work is attempting to get patient into long-term care facility and get her set up with Medicaid.  Pt seen bedside this AM, appears frustrated with prolonged hospital stay, understandably.   OBJECTIVE:  Vital Signs: Vitals:   02/09/23 2347 02/09/23 2347 02/10/23 0419 02/10/23 0812  BP: 123/70 123/70 112/71 135/75  Pulse: 79 79 75 83  Resp: 18 18 18 16   Temp: (!) 97.4 F (36.3 C) (!) 97.4 F (36.3 C) 97.6 F (36.4 C) 97.6 F (36.4 C)  TempSrc: Oral Oral Oral Oral  SpO2: 100% 100% 99%   Weight:      Height:       Supplemental O2: Room Air   Filed Weights   01/23/23 1417  Weight: 73.9 kg     Intake/Output Summary (Last 24 hours) at 02/10/2023 1124 Last data filed at 02/10/2023 1005 Gross per 24 hour  Intake 581 ml  Output --  Net 581 ml   Net IO Since Admission: 4,207.25 mL [02/10/23 1124]  Physical Exam: Physical Exam Constitutional:      General: She is not in acute distress, lying still in bed.    Appearance: She is not ill-appearing but appears  very tired, flat affect.  Cardiovascular:     Rate and Rhythm: Normal rate.  Pulmonary:     Effort: Pulmonary effort is normal.  Abdominal:     General: Bowel sounds are normal.     Tenderness: There is no abdominal tenderness; suprapubic tenderness noted yesterday is not present.     Musculoskeletal:     Right lower leg: No edema.     Left lower leg: No edema.  Skin:    General: Skin is warm. Well healing contusion on L side of face.  Skin turgor is reduced generally. Neurological:     Mental Status: She demonstrates purposeful movements. She is able to follow commands, and able to respond to verbal cues with no hesitation. Strength exam 3/5 in both upper and lower extremities, consistent with her hospital course.  Psychiatric:        Mood and Affect: Affect is flat, not expressive.  She seems withdrawn.    Behavior: Behavior normal.   Patient Lines/Drains/Airways Status     Active Line/Drains/Airways     Name Placement date Placement time Site Days   Peripheral IV 02/05/23 22 G Distal;Posterior;Right Forearm 02/05/23  1835  Forearm  3   Wound / Incision (Open or Dehisced) 10/29/18 Other (Comment) Leg Left;Posterior;Upper Boil 10/29/18  0058  Leg  1563   Wound / Incision (Open or Dehisced) 01/26/23 Laceration Head Posterior;Right;Upper laceration 01/26/23  1610  Head  13             ASSESSMENT/PLAN:  Assessment: Principal Problem:   Acute on chronic intracranial subdural hematoma (HCC) Active Problems:   Hypertension   Dementia (HCC)   Seizure (HCC)   Acute urinary retention  Audyn Dimercurio [name] is a 73 yo with history of progressive dementia who presented to [facility] ED on the morning of 01/22/2023 after a seizure and was admitted for subdural hematoma with subsequent seizure and somnolence    Anne [name] is ready for discharge. Presently I am assessing for a possible UTI but that should not hold her up unless her condition changes. Her mentation and responsiveness are steady for  many days. She is able to respond to commands and participate in physical therapy. She remains as family seek SNF placement.   Plan: Subdural Hematoma secondary to fall from standing causing Seizures and Somnolence (Resolved) Previous fall from hospital bed causing rebleed, scalp laceration (improving) Mental status remains grossly stable.  She has improved significantly from the somnolence she exhibited when she first presented.  I do not think she is at her previous baseline as she is less communicative than before, according to family.  That said her neurologic exam has improved completely, there are no focal deficits or remaining somnolence.  She is able to respond to questions briefly and answers appropriately with nods or short phrases.  She is able to work with physical therapy and that is going well, she is following instructions. She has started advocating for herself. No seizures for many days, somnolence improved fully. She responds to prompts, answers questions with short sentences. She is oriented to person only. - Dispo is a challenge: SNF placement denied, appeal denied. Discharge home is risky given poor balance and dementia. She has demonstrated ability to work with PT, feed self, and is making small improvements in mentation. Feel that hospice is premature here. She will remain here and work with PT in the meantime. May ultimately be discharged home with PT/OT/HH. But first, social is attempting to get her medicaid and LTC placement. No update as of yet. - CT scan 12/29 showed 1 mm increase in size of hematoma and and rightward shift, now 18 mm and 9 mm respectively.  There is also new uncal herniation present.  That said neuroexam is unchanged.  Discussed with neurosurgeon Dr. Carollee who saw her last week, he was very helpful, will not pursue surgery at this time. - Continue PO Keppra  100 mg q12H with IV midazolam  2 mg PRN for rescue    Urinary retention Urinary retention started  yesterday but appears to have resolved.  Appears to be retaining about 250 mL fluid.  Does not report dysuria.  Yesterday had suprapubic tenderness on exam but now resolved.  Mentation has not changed. - UA unremarkable     Dementia Dementia is significant enough to require daily care from her husband and family.  That said she is able to respond to commands, communicate, work with physical therapy, move about the room with assistance.  Continue to recommend family presence and orientation strategies (orienting patient to day, time, place, keeping blinds open during the day, getting meals regularly).  - Memantine  and rivastigmine  have been resumed, beginning at lower doses with plan to titrate up to home dose. Depakote  and Seroquel  home meds have not been need and should not be continued.   Hemorrhoids Examination deferred. Preparation H was given, now ordered twice daily, and appears to  have relieved the patient. - Preparation H rectal ointment BID   HTN Goal SBP is below 150 per evolving subdural hematoma. Blood pressures have required decreasing meds/doses and at this point we are stopping them to prevent hypotension. - Hold Amlodipine  5 mg daily, Irbesartan  300 mg daily given low BPs    Thrombocytopenia- resolved Holding home Depakote . Platelet count WNL at 207 12/26. - Monitor    Depression/Anxiety Bupropion  has been stopped indefinitely due to seizures.  Sertraline  has been resumed from lower dose with goal of titrating up to at least 100 mg.   Hypothyroidism Resume synthroid  88 mcg daily. She has appeared overcorrected recently and TSH should be repeated.     HLD Resume rosuvastatin  20 mg daily, though continuation should be done with goals in mind.  If goals are for quality of life rather than longevity, a discussion about stopping would be appropriate.    Best Practice: Diet: Dys 2, thin fluids, Ensure for feeding supplement IVF: prn VTE: SCDs Start: 01/22/23 1400 Code:  DNR/DNI AB: None Therapy Recs: SNF DISPO: Anticipated discharge to SNF pending appeal  Signature: Roetta Chars, MD Internal Medicine Resident, PGY-2 [facility]  Pager: [phone] 11:24 AM, 02/10/2023   Please contact the on call pager after 5 pm and on weekends at [phone].

## 2023-02-10 NOTE — Plan of Care (Signed)

## 2023-02-11 DIAGNOSIS — F03A18 Unspecified dementia, mild, with other behavioral disturbance: Secondary | ICD-10-CM | POA: Diagnosis not present

## 2023-02-11 DIAGNOSIS — I6203 Nontraumatic chronic subdural hemorrhage: Secondary | ICD-10-CM | POA: Diagnosis not present

## 2023-02-11 DIAGNOSIS — I6201 Nontraumatic acute subdural hemorrhage: Secondary | ICD-10-CM | POA: Diagnosis not present

## 2023-02-11 LAB — GLUCOSE, CAPILLARY: Glucose-Capillary: 99 mg/dL (ref 70–99)

## 2023-02-11 NOTE — Progress Notes (Addendum)
 HD#20 SUBJECTIVE:  Patient Summary: Anne [name] is a 73 yo patient with a history of dementia with depressed mood, HTN, HLD, hypothyroidism, and metastatic lung cancer (in remission) who presented after a seizure associated with subdural hematoma. Patient was admitted for management of seizure in the setting of evolving subdural hematoma due to fall.    Patient experienced an unwitnessed fall when patient attempted to leave her bed on 12/21 with a laceration of about 2.5 cm in length on her R posterior scalp s/p 2 stitches. Follow up CT imaging showed a mixed density subdural hematoma of the left cerebral hemisphere of increased thickness compared to previously identified subdural hematoma with an increased mass effect and partial effacement of the left lateral ventricle.   Overnight Events: None.    Interim History: Patient continues to wait for appropriate patient placement.  Appeal to SNF was denied by insurance.  Social work is attempting to get patient into long-term care facility and get her set up with Medicaid.  Pt seen bedside this AM, she is interactive. Complains of some hemorrhoidal pan. No other complaints.   OBJECTIVE:  Vital Signs: Vitals:   02/10/23 2356 02/11/23 0413 02/11/23 0756 02/11/23 1136  BP: 120/60 122/73 114/69 103/67  Pulse: 89 81 80 89  Resp: 17 16 17 18   Temp: (!) 97.5 F (36.4 C) 98 F (36.7 C) 98.1 F (36.7 C) 97.7 F (36.5 C)  TempSrc: Oral Oral Oral Oral  SpO2: 96% 93% 95% 92%  Weight:      Height:       Supplemental O2: Room Air SpO2: 92 % O2 Flow Rate (L/min): 2 L/min  Filed Weights   01/23/23 1417  Weight: 73.9 kg     Intake/Output Summary (Last 24 hours) at 02/11/2023 1139 Last data filed at 02/10/2023 2000 Gross per 24 hour  Intake 240 ml  Output --  Net 240 ml   Net IO Since Admission: 4,447.25 mL [02/11/23 1139]  Physical Exam: Constitutional:      General: She is not in acute distress, lying still in bed.    Appearance: She  is not ill-appearing but appears very tired, flat affect.  Cardiovascular:     Rate and Rhythm: Normal rate.  Pulmonary:     Effort: Pulmonary effort is normal.  Abdominal:     General: Bowel sounds are normal.     Tenderness: There is no abdominal tenderness; suprapubic tenderness noted yesterday is not present.     Musculoskeletal:     Right lower leg: No edema.     Left lower leg: No edema.  Skin:    General: Skin is warm. Well healing contusion on L side of face.  Skin turgor is reduced generally. Neurological:     Mental Status: She demonstrates purposeful movements. She is able to follow commands, and able to respond to verbal cues with no hesitation. Strength exam 3/5 in both upper and lower extremities, consistent with her hospital course.  Psychiatric:        Mood and Affect: Affect is flat, not expressive.  She seems withdrawn.    Behavior: Behavior normal.   Patient Lines/Drains/Airways Status     Active Line/Drains/Airways     Name Placement date Placement time Site Days   Wound / Incision (Open or Dehisced) 10/29/18 Other (Comment) Leg Left;Posterior;Upper Boil 10/29/18  0058  Leg  1566   Wound / Incision (Open or Dehisced) 01/26/23 Laceration Head Posterior;Right;Upper laceration 01/26/23  1610  Head  16  ASSESSMENT/PLAN:  Assessment: Principal Problem:   Acute on chronic intracranial subdural hematoma (HCC) Active Problems:   Hypertension   Dementia (HCC)   Seizure (HCC)   Acute urinary retention  Anne [name] is a 73 yo with history of progressive dementia who presented to [facility] ED on the morning of 01/22/2023 after a seizure and was admitted for subdural hematoma with subsequent seizure and somnolence    [name] is ready for discharge. Her mentation and responsiveness are steady for many days. She is able to respond to commands and participate in physical therapy. She remains as family seek long-term care placement.   Plan: Subdural  Hematoma secondary to fall from standing causing Seizures and Somnolence (Resolved) Previous fall from hospital bed causing rebleed, scalp laceration (improving) Mental status remains grossly stable.  She has improved significantly from the somnolence she exhibited when she first presented.  Her neurologic exam has improved completely, there are no focal deficits or remaining somnolence.  She is able to respond to questions briefly and answers appropriately with nods or short phrases.  She is able to work with physical therapy and that is going well, she is following instructions. She has started advocating for herself. No seizures for many days, somnolence improved fully. She responds to prompts, answers questions with short sentences. She is oriented to person only.  - Dispo is a challenge: SNF placement denied, appeal denied. Discharge home is risky given poor balance and dementia. She has demonstrated ability to work with PT, feed self, and is making small improvements in mentation. Feel that hospice is premature here. She will remain here and work with PT in the meantime. May ultimately be discharged home with PT/OT/HH. But first, social is attempting to get her medicaid and LTC placement. No update as of yet. - CT scan 12/29 showed 1 mm increase in size of hematoma and and rightward shift, now 18 mm and 9 mm respectively.  There is also new uncal herniation present.  That said neuroexam is unchanged.  Discussed with neurosurgeon Dr. Carollee who saw her earlier, will not pursue surgery at this time, no indications even with change in neuro exam. - Continue PO Keppra  100 mg q12H    Urinary retention, resolved     Dementia Dementia is significant enough to require daily care from her husband and family.  That said she is able to respond to commands, communicate, work with physical therapy, move about the room with assistance.  Continue to recommend family presence and orientation strategies (orienting  patient to day, time, place, keeping blinds open during the day, getting meals regularly).  - Memantine  and rivastigmine  have been resumed, beginning at lower doses with plan to titrate up to home dose. Depakote  and Seroquel  home meds have not been need and should not be continued.   Hemorrhoids Examination deferred. Preparation H was given, now ordered twice daily, and appears to have relieved the patient. - Preparation H rectal ointment BID   HTN Goal SBP is below 150 per evolving subdural hematoma. Blood pressures have required decreasing meds/doses and at this point we are stopping them to prevent hypotension. - Hold Amlodipine  5 mg daily, Irbesartan  300 mg daily given low BPs    Thrombocytopenia- resolved Holding home Depakote . Platelet count WNL at 162 1/1. - Monitor    Depression/Anxiety Bupropion  has been stopped indefinitely due to seizures.  Sertraline  has been tirtrated to 100 mg.   Hypothyroidism Resume synthroid  88 mcg daily. She has appeared overcorrected recently and  TSH should be repeated.     HLD Resume rosuvastatin  20 mg daily, though continuation should be done with goals in mind.  If goals are for quality of life rather than longevity, a discussion about stopping would be appropriate.    Best Practice: Diet: Dys 2, thin fluids, Ensure for feeding supplement IVF: prn VTE: SCDs Start: 01/22/23 1400 Code: DNR/DNI AB: None Therapy Recs: SNF DISPO: Anticipated discharge to home vs LTC pending placement  Signature: Lonni Africa, D.O.  Internal Medicine Resident, PGY-1 [facility]  Pager: [phone] 11:39 AM, 02/11/2023   Please contact the on call pager after 5 pm and on weekends at [phone].

## 2023-02-11 NOTE — TOC Progression Note (Signed)
 Transition of Care [facility]) - Progression Note    Patient Details  Name: Anne [name] MRN: [id number] Date of Birth: [birth year]  Transition of Care [facility]) CM/SW Contact  Inocente GORMAN Kindle, LCSW Phone Number: 02/11/2023, 9:43 AM  Clinical Narrative:    CSW continuing to follow. Financial Counseling working on [employer] application for ltc. Will request leadership review for LOG.    Expected Discharge Plan: Skilled Nursing Facility Barriers to Discharge: Insurance Authorization, SNF Pending Medicaid, SNF Pending bed offer, SNF Pending payor source - LOG  Expected Discharge Plan and Services In-house Referral: Clinical Social Work   Post Acute Care Choice: Skilled Nursing Facility Living arrangements for the past 2 months: Single Family Home                                       Social Determinants of Health (SDOH) Interventions SDOH Screenings   Food Insecurity: Patient Unable To Answer (01/22/2023)  Housing: Patient Unable To Answer (01/22/2023)  Transportation Needs: Patient Unable To Answer (01/22/2023)  Utilities: Patient Unable To Answer (01/22/2023)  Depression (PHQ2-9): High Risk (06/21/2022)  Financial Resource Strain: Low Risk  (09/08/2020)  Physical Activity: Unknown (10/29/2018)  Social Connections: Unknown (02/06/2023)  Stress: No Stress Concern Present (09/08/2020)  Tobacco Use: Medium Risk (01/22/2023)    Readmission Risk Interventions     No data to display

## 2023-02-11 NOTE — Plan of Care (Signed)
   Problem: Clinical Measurements: Goal: Cardiovascular complication will be avoided Outcome: Progressing   Problem: Activity: Goal: Risk for activity intolerance will decrease Outcome: Progressing

## 2023-02-12 NOTE — Progress Notes (Signed)
 Occupational Therapy Treatment Patient Details Name: Anne [name] MRN: [id number] DOB: 14-Dec-1950 Today's Date: 02/12/2023   History of present illness Patient is a 73 year old female admitted 12/17 with new onset of seizures in the setting of underlying SDH. Fall in hospital with increased size of R SDH on CT. No change in neuro status and MD/family decided against any surgery. History of dementia, lung cancer, seizures, hypothyroid.   OT comments  New goals established this session, including addition additional goal for attention during ADL tasks. Encouraged Pt to use words more - asking her to make a selection between 2 choices (hot or cold washcloth for example) Pt able to walk into hallway with mod A for safety as Pt gets too far away from RW, Pt participated in LB dressing (underwear and pants) with max A and engaged in sink level grooming with seated rest break. Pt continues to require cues for R inattention. OT will continue to follow acutely post-acute OT at the <3 hour therapy continues to be essential to maximize safety and independence in ADL and functional transfers.       If plan is discharge home, recommend the following:  A lot of help with walking and/or transfers;A lot of help with bathing/dressing/bathroom   Equipment Recommendations  BSC/3in1    Recommendations for Other Services      Precautions / Restrictions Precautions Precautions: Fall Precaution Comments: Systolic blood pressure goal below 150; watch for orthostasis Restrictions Weight Bearing Restrictions Per Provider Order: No       Mobility Bed Mobility Overal bed mobility: Needs Assistance Bed Mobility: Supine to Sit     Supine to sit: Min assist     General bed mobility comments: pt reaching out for assist to elevate trunk    Transfers Overall transfer level: Needs assistance Equipment used: Rolling walker (2 wheels) Transfers: Sit to/from Stand Sit to Stand: Min assist            General transfer comment: from EOB with min A  needing cues for hand placement and assist to rise     Balance Overall balance assessment: Needs assistance, History of Falls Sitting-balance support: Feet supported, No upper extremity supported Sitting balance-Leahy Scale: Fair     Standing balance support: Bilateral upper extremity supported, During functional activity Standing balance-Leahy Scale: Poor Standing balance comment: Mild instability but no overt LOB.                           ADL either performed or assessed with clinical judgement   ADL Overall ADL's : Needs assistance/impaired     Grooming: Wash/dry face;Oral care;Moderate assistance;Standing Grooming Details (indicate cue type and reason): stood at sink with 2 stands to complete tasks, cues for grooming items on  the right, fatigued from walk in hallway             Lower Body Dressing: Maximal assistance;+2 for safety/equipment;Sit to/from stand Lower Body Dressing Details (indicate cue type and reason): donning underwear and PJ pants Toilet Transfer: Moderate assistance;Ambulation;Rolling walker (2 wheels) Toilet Transfer Details (indicate cue type and reason): unsafe with proximity of RW and Pt's body, max cues for positioning Toileting- Clothing Manipulation and Hygiene: Maximal assistance;Sit to/from stand       Functional mobility during ADLs: Moderate assistance;Cueing for safety;Cueing for sequencing;Rolling walker (2 wheels) General ADL Comments: ADL participation impacted by impaired cog, balance and activity tolerance    Extremity/Trunk Assessment Upper Extremity Assessment Upper Extremity  Assessment: Right hand dominant;RUE deficits/detail RUE Deficits / Details: full AROM, impaired motor planning RUE Sensation: WNL RUE Coordination: decreased fine motor;decreased gross motor            Vision       Perception Perception Perception: Impaired Preception Impairment Details:  Inattention/Neglect Perception-Other Comments: R side   Praxis Praxis Praxis: Impaired Praxis Impairment Details: Motor planning    Cognition Arousal: Alert Behavior During Therapy: Flat affect Overall Cognitive Status: Impaired/Different from baseline Area of Impairment: Attention, Memory, Following commands, Safety/judgement, Awareness, Problem solving                   Current Attention Level: Focused Memory: Decreased short-term memory Following Commands: Follows one step commands inconsistently Safety/Judgement: Decreased awareness of safety, Decreased awareness of deficits Awareness: Intellectual Problem Solving: Slow processing, Decreased initiation, Difficulty sequencing, Requires verbal cues, Requires tactile cues General Comments: R inattention, encouraged to use more words, made to choose between two options and would        Exercises      Shoulder Instructions       General Comments husband present and helpful throughout session    Pertinent Vitals/ Pain       Pain Assessment Pain Assessment: No/denies pain Pain Intervention(s): Monitored during session  Home Living                                          Prior Functioning/Environment              Frequency  Min 1X/week        Progress Toward Goals  OT Goals(current goals can now be found in the care plan section)  Progress towards OT goals: Progressing toward goals  Acute Rehab OT Goals Patient Stated Goal: get her more independent so husband can take her home eventually OT Goal Formulation: With family Time For Goal Achievement: 02/26/23 Potential to Achieve Goals: Fair ADL Goals Additional ADL Goal #2: Pt will sustain attention to ADL tasks for at least 3 mins without cues.  Plan      Co-evaluation                 AM-PAC OT 6 Clicks Daily Activity     Outcome Measure   Help from another person eating meals?: A Little Help from another person  taking care of personal grooming?: A Little Help from another person toileting, which includes using toliet, bedpan, or urinal?: A Lot Help from another person bathing (including washing, rinsing, drying)?: A Lot Help from another person to put on and taking off regular upper body clothing?: A Lot Help from another person to put on and taking off regular lower body clothing?: A Lot 6 Click Score: 14    End of Session Equipment Utilized During Treatment: Gait belt;Rolling walker (2 wheels)  OT Visit Diagnosis: Unsteadiness on feet (R26.81);Other abnormalities of gait and mobility (R26.89);Muscle weakness (generalized) (M62.81)   Activity Tolerance Patient tolerated treatment well   Patient Left in chair;with call bell/phone within reach;with chair alarm set;Other (comment);with family/visitor present   Nurse Communication Mobility status        Time: 8857-8793 OT Time Calculation (min): 24 min  Charges: OT General Charges $OT Visit: 1 Visit OT Treatments $Self Care/Home Management : 23-37 mins  Leita DEL OTR/L Acute Rehabilitation Services Office: [phone]  Leita PARAS [facility] 02/12/2023, 2:30 PM

## 2023-02-12 NOTE — Plan of Care (Signed)

## 2023-02-12 NOTE — Progress Notes (Signed)
 Physical Therapy Treatment Patient Details Name: Anne [name] MRN: [id number] DOB: 02-22-1950 Today's Date: 02/12/2023   History of Present Illness Patient is a 73 year old female admitted 12/17 with new onset of seizures in the setting of underlying SDH. Fall in hospital with increased size of R SDH on CT. No change in neuro status and MD/family decided against any surgery. History of dementia, lung cancer, seizures, hypothyroid.    PT Comments  Pt up in chair on arrival and agreeable to session. Pt limited this session by increased fatigue resulting in decreased activity tolerance, increased R lateral lean in standing mobility and increased R inattention. Pt able to come to stand from chair with CGA and low commode with min A to boost. Pt requiring min A with intermittent mod A to maintain balance during gait with RW for support with max cues for attention to R environment and closer proximity to RW. Pt continues to me minimally verbal throughout session, however able to answer yes/no questions with 50% accuracy. Pt continues to benefit from skilled PT services to progress toward functional mobility goals.      If plan is discharge home, recommend the following: Supervision due to cognitive status;Help with stairs or ramp for entrance;Assist for transportation;Direct supervision/assist for medications management;Direct supervision/assist for financial management;Assistance with cooking/housework;A little help with walking and/or transfers;A little help with bathing/dressing/bathroom   Can travel by private vehicle     No  Equipment Recommendations  None recommended by PT    Recommendations for Other Services       Precautions / Restrictions Precautions Precautions: Fall Precaution Comments: Systolic blood pressure goal below 150; watch for orthostasis Restrictions Weight Bearing Restrictions Per Provider Order: No     Mobility  Bed Mobility Overal bed mobility: Needs Assistance              General bed mobility comments: pt up in chair on arrival    Transfers Overall transfer level: Needs assistance Equipment used: Rolling walker (2 wheels) Transfers: Sit to/from Stand Sit to Stand: Min assist, Contact guard assist           General transfer comment: from chair with CGA for safety, min A from low commode with cues for hand placement    Ambulation/Gait Ambulation/Gait assistance: Min assist Gait Distance (Feet): 12 Feet (+ 25) Assistive device: Rolling walker (2 wheels) Gait Pattern/deviations: Step-through pattern, Decreased step length - right, Decreased step length - left, Decreased stride length, Narrow base of support, Trunk flexed Gait velocity: decreased     General Gait Details: min A to manage RW as pt drifitng R due to inattention and bumping obstacles in R environment, min A also needed to maintain balance as pt with increased R lateral lean with fatigue   Stairs             Wheelchair Mobility     Tilt Bed    Modified Rankin (Stroke Patients Only)       Balance Overall balance assessment: Needs assistance, History of Falls Sitting-balance support: Feet supported, No upper extremity supported Sitting balance-Leahy Scale: Fair     Standing balance support: Bilateral upper extremity supported, During functional activity Standing balance-Leahy Scale: Poor Standing balance comment: Mild instability but no overt LOB.                            Cognition Arousal: Alert Behavior During Therapy: Flat affect Overall Cognitive Status: Impaired/Different from baseline Area  of Impairment: Attention, Memory, Following commands, Safety/judgement, Awareness, Problem solving                   Current Attention Level: Focused Memory: Decreased short-term memory Following Commands: Follows one step commands inconsistently Safety/Judgement: Decreased awareness of safety, Decreased awareness of  deficits Awareness: Intellectual Problem Solving: Slow processing, Decreased initiation, Difficulty sequencing, Requires verbal cues, Requires tactile cues General Comments: R inattention, encouraged to use more words        Exercises      General Comments General comments (skin integrity, edema, etc.): husband present and helpful throughout session      Pertinent Vitals/Pain Pain Assessment Pain Assessment: No/denies pain Pain Intervention(s): Monitored during session    Home Living                          Prior Function            PT Goals (current goals can now be found in the care plan section) Acute Rehab PT Goals PT Goal Formulation: With patient Time For Goal Achievement: 02/07/23 Progress towards PT goals: Progressing toward goals    Frequency    Min 1X/week      PT Plan      Co-evaluation              AM-PAC PT 6 Clicks Mobility   Outcome Measure  Help needed turning from your back to your side while in a flat bed without using bedrails?: A Little Help needed moving from lying on your back to sitting on the side of a flat bed without using bedrails?: A Little Help needed moving to and from a bed to a chair (including a wheelchair)?: A Little Help needed standing up from a chair using your arms (e.g., wheelchair or bedside chair)?: A Little Help needed to walk in hospital room?: A Lot Help needed climbing 3-5 steps with a railing? : Total 6 Click Score: 15    End of Session Equipment Utilized During Treatment: Gait belt Activity Tolerance: Patient limited by fatigue Patient left: with call bell/phone within reach;in chair;with chair alarm set Nurse Communication: Mobility status PT Visit Diagnosis: Other abnormalities of gait and mobility (R26.89);Difficulty in walking, not elsewhere classified (R26.2);Other symptoms and signs involving the nervous system (R29.898);History of falling (Z91.81);Unsteadiness on feet (R26.81);Muscle  weakness (generalized) (M62.81)     Time: 1453-1510 PT Time Calculation (min) (ACUTE ONLY): 17 min  Charges:    $Therapeutic Activity: 8-22 mins PT General Charges $$ ACUTE PT VISIT: 1 Visit                     Mattalynn Crandle R. PTA Acute Rehabilitation Services Office: [phone]   Therisa CHRISTELLA Boor 02/12/2023, 3:30 PM

## 2023-02-12 NOTE — Progress Notes (Addendum)
 HD#21 SUBJECTIVE:  Patient Summary: Anne [name] is a 73 yo patient with a history of dementia with depressed mood, HTN, HLD, hypothyroidism, and metastatic lung cancer (in remission) who presented after a seizure associated with subdural hematoma. Patient was admitted for management of seizure in the setting of evolving subdural hematoma due to fall.    Patient experienced an unwitnessed fall when patient attempted to leave her bed on 12/21 with a laceration of about 2.5 cm in length on her R posterior scalp s/p 2 stitches. Follow up CT imaging showed a mixed density subdural hematoma of the left cerebral hemisphere of increased thickness compared to previously identified subdural hematoma with an increased mass effect and partial effacement of the left lateral ventricle.   Overnight Events: None.    Interim History: Patient continues to wait for appropriate patient placement.  Appeal to SNF was denied by insurance.  Social work is attempting to get patient into long-term care facility and get her set up with Medicaid.  Pt seen bedside this AM, she is interactive. No other complaints.   OBJECTIVE:  Vital Signs: Vitals:   02/12/23 0019 02/12/23 0351 02/12/23 0748 02/12/23 1059  BP: 129/77 101/64 117/72 122/74  Pulse: 95 92 79 81  Resp: 17 17 17 18   Temp: 98 F (36.7 C) (!) 97.4 F (36.3 C) (!) 97.5 F (36.4 C) 97.8 F (36.6 C)  TempSrc: Axillary  Oral Oral  SpO2: 92% 90% 93% 94%  Weight:      Height:       Supplemental O2: Room Air SpO2: 94 % O2 Flow Rate (L/min): 2 L/min  Filed Weights   01/23/23 1417  Weight: 73.9 kg    No intake or output data in the 24 hours ending 02/12/23 1315 Net IO Since Admission: 4,447.25 mL [02/12/23 1315]  Physical Exam: Constitutional:      General: She is not in acute distress, lying still in bed.    Appearance: She is not ill-appearing but appears very tired, flat affect.  Cardiovascular:     Rate and Rhythm: Normal rate.  Pulmonary:      Effort: Pulmonary effort is normal.  Abdominal:     General: Bowel sounds are normal.     Tenderness: There is no abdominal tenderness; suprapubic tenderness noted yesterday is not present.     Musculoskeletal:     Right lower leg: No edema.     Left lower leg: No edema.  Skin:    General: Skin is warm. Well healing contusion on L side of face.  Skin turgor is reduced generally. Neurological:     Mental Status: She demonstrates purposeful movements. She is able to follow commands, and able to respond to verbal cues with no hesitation. Strength exam 3/5 in both upper and lower extremities, consistent with her hospital course.  Psychiatric:        Mood and Affect: Affect is flat, not expressive.  She seems withdrawn.    Behavior: Behavior normal.   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 ED on the morning of 01/22/2023 after a seizure and was admitted for subdural hematoma with subsequent seizure and somnolence    Anne [name] is ready for discharge. Her mentation and responsiveness are steady for many days. She is able to respond to commands and participate in physical therapy. She remains as family seek long-term care placement.   Plan: Subdural Hematoma secondary to fall from standing causing Seizures and Somnolence (Resolved) Previous fall from hospital bed causing  rebleed, scalp laceration (improving) Mental status remains grossly stable.  She has improved significantly from the somnolence she exhibited when she first presented.  Her neurologic exam has improved completely, there are no focal deficits or remaining somnolence.  She is able to respond to questions briefly and answers appropriately with nods or short phrases.  She is able to work with physical therapy and that is going well, she is following instructions. She has started advocating for herself. No seizures for many days, somnolence improved fully. She responds to prompts, answers questions with short sentences. She is oriented to person only.   - Dispo is a challenge: SNF placement denied, appeal denied. Discharge home is risky given poor balance and dementia. She has demonstrated ability to work with PT, feed self, and is making small improvements in mentation. Feel that hospice is premature here. She will remain here and work with PT in the meantime. May ultimately be discharged home with PT/OT/HH. But first, social is attempting to get her medicaid and LTC placement. No update as of yet. - CT scan 12/29 showed 1 mm increase in size of hematoma and and rightward shift, now 18 mm and 9 mm respectively.  There is also new uncal herniation present.  That said neuroexam is unchanged.  Discussed with neurosurgeon Dr. Carollee who saw her earlier, will not pursue surgery at this time, no indications even with change in neuro exam. - Continue PO Keppra  100 mg q12H    Urinary retention, resolved     Dementia Dementia is significant enough to require daily care from her husband and family.  That said she is able to respond to commands, communicate, work with physical therapy, move about the room with assistance.  Continue to recommend family presence and orientation strategies (orienting patient to day, time, place, keeping blinds open during the day, getting meals regularly).  - Memantine  and rivastigmine   have been resumed, beginning at lower doses with plan to titrate up to home dose. Depakote  and Seroquel  home meds have not been need and should not be continued.   Hemorrhoids Examination deferred. Preparation H was given, now ordered twice daily, and appears to have relieved the patient. - Preparation H rectal ointment BID   HTN Goal SBP is below 150 per evolving subdural hematoma. Blood pressures have required decreasing meds/doses and at this point we are stopping them to prevent hypotension. - Hold Amlodipine  5 mg daily, Irbesartan  300 mg daily given low BPs    Thrombocytopenia- resolved Holding home Depakote . Platelet count WNL at 162 1/1. - Monitor    Depression/Anxiety Bupropion  has been stopped indefinitely due to seizures.  Sertraline  has been titrated to 100 mg.   Hypothyroidism Resume synthroid  88 mcg daily. She has appeared overcorrected recently and TSH can be repeated at next lab draw.  HLD Resume rosuvastatin  20 mg daily, though continuation should be done with goals in mind.  If goals are for quality of life rather than longevity, a discussion about stopping would be appropriate.    Best Practice: Diet: Dys 2, thin fluids, Ensure for feeding supplement IVF: prn VTE: SCDs Start: 01/22/23 1400 Code: DNR/DNI AB: None Therapy Recs: SNF DISPO: Anticipated discharge to home vs LTC pending placement  Signature: Lonni Africa, D.O.  Internal Medicine Resident, PGY-1 [facility]  Pager: [phone] 1:15 PM, 02/12/2023   Please contact the on call pager after 5 pm and on weekends at [phone].

## 2023-02-13 DIAGNOSIS — I6203 Nontraumatic chronic subdural hemorrhage: Secondary | ICD-10-CM | POA: Diagnosis not present

## 2023-02-13 DIAGNOSIS — F03A18 Unspecified dementia, mild, with other behavioral disturbance: Secondary | ICD-10-CM | POA: Diagnosis not present

## 2023-02-13 DIAGNOSIS — R569 Unspecified convulsions: Secondary | ICD-10-CM | POA: Diagnosis not present

## 2023-02-13 DIAGNOSIS — I6201 Nontraumatic acute subdural hemorrhage: Secondary | ICD-10-CM | POA: Diagnosis not present

## 2023-02-13 NOTE — Progress Notes (Addendum)
 HD#22 SUBJECTIVE:  Patient Summary: Anne [name] is a 73 yo patient with a history of dementia with depressed mood, HTN, HLD, hypothyroidism, and metastatic lung cancer (in remission) who presented after a seizure associated with subdural hematoma. Patient was admitted for management of seizure in the setting of evolving subdural hematoma due to fall.    Upon admission her bleed stabilized and there were no recurrent seizures having started Keppra . She was ready for discharge until an unwitnessed fall from bed on 12/21 induced rebleed of her subdural hematoma producing increased mass effect, R midline shift, and slight L uncal herniation. Neurologic status however was stable and she was not felt to be an operative candidate.  Since then, she remains medically stable and is awaiting appropriate placement to leave hospital. SNF was unfortunately denied by insurance, hospice is premature, but home environment has limited support. Social work is attempting to set up long-term care facility for this patient.  Overnight Events: None  Pt Concerns: She is comfortable in bed this morning. Her only complaint is being a bit chilly. She denies pain, including from hemorrhoids.   OBJECTIVE:  Vital Signs: Vitals:   02/12/23 1607 02/12/23 1946 02/12/23 2336 02/13/23 0353  BP: 109/69 116/80 112/77 133/77  Pulse: 86 87 87 79  Resp: 17 17 17 17   Temp:  97.8 F (36.6 C) (!) 97.4 F (36.3 C) (!) 97.4 F (36.3 C)  TempSrc:      SpO2: 91% 92% 92% 95%  Weight:      Height:       Supplemental O2: Room Air SpO2: 95 % O2 Flow Rate (L/min): 2 L/min  Filed Weights   01/23/23 1417  Weight: 73.9 kg    No intake or output data in the 24 hours ending 02/13/23 0815 Net IO Since Admission: 4,447.25 mL [02/13/23 0815]  Physical Exam: Physical Exam Constitutional:      General: She is not in acute distress.    Appearance: She is not ill-appearing.  Cardiovascular:     Rate and Rhythm: Normal rate.      Pulses: Normal pulses.  Pulmonary:     Effort: Pulmonary effort is normal.  Musculoskeletal:     Right lower leg: No edema.     Left lower leg: No edema.  Skin:    General: Skin is warm.     Capillary Refill: Capillary refill takes less than 2 seconds.  Neurological:     Mental Status: She is alert. Mental status is at baseline.     Comments: Oriented to person only. Fully and appropriately responsive to questions. Answers with nods and occasional short phrases.    Patient Lines/Drains/Airways Status     Active Line/Drains/Airways     Name Placement date Placement time Site Days   Wound / Incision (Open or Dehisced) 10/29/18 Other (Comment) Leg Left;Posterior;Upper Boil 10/29/18  0058  Leg  1568   Wound / Incision (Open or Dehisced) 01/26/23 Laceration Head Posterior;Right;Upper laceration 01/26/23  1610  Head  18            ASSESSMENT/PLAN:  Assessment: Principal Problem:   Acute on chronic intracranial subdural hematoma (HCC) Active Problems:   Hypertension   Dementia (HCC)   Seizure (HCC)   Acute urinary retention  Anne [name] is a 73 yo patient with a history of dementia with depressed mood, HTN, HLD, hypothyroidism, and metastatic lung cancer (in remission) who presented after a seizure associated with subdural hematoma. Patient was admitted for management of  seizure in the setting of evolving subdural hematoma due to fall.    Upon admission her bleed stabilized and there were no recurrent seizures having started Keppra . She was ready for discharge until an unwitnessed fall from bed on 12/21 induced rebleed of her subdural hematoma producing increased mass effect, R midline shift, and slight L uncal herniation. Neurologic status however was stable and she was not felt to be an operative candidate.  Since then, she remains medically stable and is awaiting appropriate placement to leave hospital. SNF was unfortunately denied by insurance, hospice is premature, but  home environment has limited support. Social work is attempting to set up long-term care facility for this patient.  Plan: Subdural Hematoma secondary to fall from standing causing Seizures and Somnolence (Resolved) Previous fall from hospital bed causing rebleed, scalp laceration (Resolved) Mental status is stable. She was somnolent at admission. She is alert now. There are no focal deficits on her exam. She exhibits depressed affect and mood in the setting of her dementia and long hospital stay. Still, she responds to questions appropriately with short phrases or nods and responds to all prompting. Physical therapy continues to work with her. She cooperates. She advocates for herself. There are no seizures since her time in the ED. She is oriented to person only.   - Dispo is a challenge: SNF placement denied, appeal denied. Discharge straight to home is risky given poor balance and dementia and limited support. I dont believe she is at imminent end of life demanding hospice. Social is working on a long-term care facility for her. She will remain here and work with PT in the meantime. May ultimately require discharge home with PT/OT/HH.  - After fall from bed, CT scan 12/29 showed an 18 mm hematoma and 9 mm rightward shift.  There was also new uncal herniation present.  That said neuroexam remains stable.  Discussed with neurosurgeon Dr. Carollee who saw her earlier, will not pursue surgery, no indications even with change in neuro exam. - Continue PO Keppra  100 mg q12H    Urinary retention, resolved Bladder scans and in/out cath if needed. Earlier UA unremarkable.  Dementia Dementia is significant enough to require daily care from her husband and family.  That said she is able to respond to commands, communicate, work with physical therapy, move about the room with assistance.  Continue to recommend family presence and orientation strategies (orienting patient to day, time, place, keeping blinds open  during the day, getting meals regularly).  - Memantine  and rivastigmine  have been resumed, beginning at lower doses with plan to titrate up to home dose. Depakote  and Seroquel  home meds have not been need and should not be continued.   Hemorrhoids Examination deferred. Preparation H was given, now ordered twice daily, and appears to have relieved the patient. - Preparation H rectal ointment BID   HTN Goal SBP is below 150 per evolving subdural hematoma. Blood pressures have required decreasing meds/doses and at this point we are stopping them to prevent hypotension. - Hold Amlodipine  5 mg daily, Irbesartan  300 mg daily given low BPs    Thrombocytopenia- resolved Holding home Depakote . Platelet count WNL at 162 1/1. - Monitor    Depression/Anxiety Bupropion  has been stopped indefinitely due to seizures.  Sertraline  has been titrated to 100 mg.   Hypothyroidism Resume synthroid  88 mcg daily. She has appeared overcorrected recently and TSH can be repeated at next lab draw.     HLD Resume rosuvastatin  20 mg daily, though  continuation should be done with goals in mind.  If goals are for quality of life rather than longevity, a discussion about stopping would be appropriate.   Best Practice: Diet: Dys 2, thin fluids, Ensure for feeding supplement IVF: prn VTE: SCDs Start: 01/22/23 1400 Code: DNR/DNI AB: None Therapy Recs: SNF DISPO: Anticipated discharge to home vs LTC pending placement  Signature: Lonni Africa, D.O.  Internal Medicine Resident, PGY-1 [facility]  Pager: [phone] 8:15 AM, 02/13/2023   Please contact the on call pager after 5 pm and on weekends at [phone].

## 2023-02-13 NOTE — TOC Progression Note (Signed)
 Transition of Care [facility]) - Progression Note    Patient Details  Name: PLEASANT BRITZ MRN: [id number] Date of Birth: Jun 05, 1950  Transition of Care [facility]) CM/SW Contact  Almarie CHRISTELLA Goodie, [name] Phone Number: 02/13/2023, 11:37 AM  Clinical Narrative:   CSW received update from financial counseling that Medicaid application was submitted yesterday. CSW will need pending Medicaid number to receive approval for LOG for LTC, pending number still processing at this time. CSW to follow.    Expected Discharge Plan: Skilled Nursing Facility Barriers to Discharge: Insurance Authorization, SNF Pending Medicaid, SNF Pending bed offer, SNF Pending payor source - LOG  Expected Discharge Plan and Services In-house Referral: Clinical Social Work   Post Acute Care Choice: Skilled Nursing Facility Living arrangements for the past 2 months: Single Family Home                                       Social Determinants of Health (SDOH) Interventions SDOH Screenings   Food Insecurity: Patient Unable To Answer (01/22/2023)  Housing: Patient Unable To Answer (01/22/2023)  Transportation Needs: Patient Unable To Answer (01/22/2023)  Utilities: Patient Unable To Answer (01/22/2023)  Depression (PHQ2-9): High Risk (06/21/2022)  Financial Resource Strain: Low Risk  (09/08/2020)  Physical Activity: Unknown (10/29/2018)  Social Connections: Unknown (02/06/2023)  Stress: No Stress Concern Present (09/08/2020)  Tobacco Use: Medium Risk (01/22/2023)    Readmission Risk Interventions     No data to display

## 2023-02-13 NOTE — Progress Notes (Signed)
 Patient constantly jumping up out of bed and chair. Staff running to the room to prevent fall. All safety measures in place. Patient not following instruction from tele-monitoring.

## 2023-02-13 NOTE — Plan of Care (Signed)
 Will continue to monitor.

## 2023-02-13 NOTE — Plan of Care (Signed)

## 2023-02-14 MED ORDER — SERTRALINE HCL 50 MG PO TABS
150.0000 mg | ORAL_TABLET | Freq: Every day | ORAL | Status: DC
  Administered 2023-02-15 – 2023-02-18 (×4): 150 mg via ORAL
  Filled 2023-02-14 (×5): qty 1

## 2023-02-14 NOTE — Plan of Care (Signed)
   Problem: Activity: Goal: Risk for activity intolerance will decrease Outcome: Progressing

## 2023-02-14 NOTE — Progress Notes (Signed)
 Occupational Therapy Treatment Patient Details Name: Anne [name] MRN: [id number] DOB: 1950/06/17 Today's Date: 02/14/2023   History of present illness Patient is a 73 year old female admitted 12/17 with new onset of seizures in the setting of underlying SDH. Fall in hospital with increased size of R SDH on CT. No change in neuro status and MD/family decided against any surgery. History of dementia, lung cancer, seizures, hypothyroid.   OT comments  Pt is making steady progress towards their acute OT goals. Significant simple 1 step cues needed for all initiation, sequencing and carry over needed throughout. Min A needed for all transfers and mod A needed for ambulation with RW, she did progress to needing max A while standing at the sink to correct R lateral lean. Pt tolerated toileting, peri hygiene and grooming at the sink, however with fatigue noted to need increased assist to prevent fall, attend to R environment and use R hemibody. OT to continue to follow acutely to facilitate progress towards established goals. Pt will continue to benefit from skilled inpatient follow up therapy, <3 hours/day.       If plan is discharge home, recommend the following:  A lot of help with walking and/or transfers;A lot of help with bathing/dressing/bathroom   Equipment Recommendations  BSC/3in1       Precautions / Restrictions Precautions Precautions: Fall Restrictions Weight Bearing Restrictions Per Provider Order: No       Mobility Bed Mobility Overal bed mobility: Needs Assistance       Supine to sit: Min assist Sit to supine: Min assist        Transfers Overall transfer level: Needs assistance Equipment used: Rolling walker (2 wheels) Transfers: Sit to/from Stand Sit to Stand: Min assist, Contact guard assist           General transfer comment: cues for hand placement     Balance Overall balance assessment: Needs assistance, History of Falls Sitting-balance support: Feet  supported, No upper extremity supported Sitting balance-Leahy Scale: Fair     Standing balance support: Bilateral upper extremity supported, During functional activity Standing balance-Leahy Scale: Poor                             ADL either performed or assessed with clinical judgement   ADL Overall ADL's : Needs assistance/impaired     Grooming: Moderate assistance;Maximal assistance;Brushing hair;Wash/dry face;Standing Grooming Details (indicate cue type and reason): mod A initially for initiation and balance, progresed to needing max A for balance with fatigue                 Toilet Transfer: Moderate assistance;Rolling walker (2 wheels) Toilet Transfer Details (indicate cue type and reason): significant cues needed, poor RW mgmt Toileting- Clothing Manipulation and Hygiene: Minimal assistance;Sitting/lateral lean Toileting - Clothing Manipulation Details (indicate cue type and reason): minA to initiate task in sitting     Functional mobility during ADLs: Maximal assistance;Rolling walker (2 wheels) General ADL Comments: limited initiation to task, poor RW mgmt, R inattention, R lean with fatigue    Extremity/Trunk Assessment Upper Extremity Assessment Upper Extremity Assessment: RUE deficits/detail RUE Deficits / Details: full AROM, impaired motor planning. needed significant multimodal cues to include RUE in functional tasks RUE Sensation: WNL   Lower Extremity Assessment Lower Extremity Assessment: Defer to PT evaluation        Vision   Vision Assessment?: No apparent visual deficits   Perception Perception Perception: Impaired Preception Impairment  Details: Inattention/Neglect   Praxis Praxis Praxis: Impaired Praxis Impairment Details: Motor planning    Cognition Arousal: Alert Behavior During Therapy: Flat affect Overall Cognitive Status: Impaired/Different from baseline Area of Impairment: Attention, Memory, Following commands,  Safety/judgement, Awareness, Problem solving                 Orientation Level: Disoriented to, Place, Time, Situation Current Attention Level: Focused Memory: Decreased short-term memory Following Commands: Follows one step commands inconsistently Safety/Judgement: Decreased awareness of safety, Decreased awareness of deficits Awareness: Intellectual Problem Solving: Slow processing, Decreased initiation, Difficulty sequencing, Requires verbal cues, Requires tactile cues General Comments: R inattention, slowed command following. benefits from simple 1 step cues. no verbalizations despite ecouragement              General Comments VSS    Pertinent Vitals/ Pain       Pain Assessment Pain Assessment: No/denies pain Faces Pain Scale: No hurt Pain Intervention(s): Monitored during session   Frequency  Min 1X/week        Progress Toward Goals  OT Goals(current goals can now be found in the care plan section)  Progress towards OT goals: Progressing toward goals  Acute Rehab OT Goals Patient Stated Goal: to get to rehab per husband OT Goal Formulation: With family Time For Goal Achievement: 02/26/23 Potential to Achieve Goals: Fair ADL Goals Pt Will Perform Grooming: with set-up;with supervision;sitting Pt Will Perform Upper Body Dressing: with min assist;sitting Pt Will Transfer to Toilet: with contact guard assist;bedside commode;ambulating Pt Will Perform Toileting - Clothing Manipulation and hygiene: with min assist;sit to/from stand Additional ADL Goal #1: Pt will demonstrate sustained attention during ADLs with Min cues Additional ADL Goal #2: Pt will sustain attention to ADL tasks for at least 3 mins without cues.   AM-PAC OT 6 Clicks Daily Activity     Outcome Measure   Help from another person eating meals?: A Little Help from another person taking care of personal grooming?: A Little Help from another person toileting, which includes using toliet,  bedpan, or urinal?: A Lot Help from another person bathing (including washing, rinsing, drying)?: A Lot Help from another person to put on and taking off regular upper body clothing?: A Lot Help from another person to put on and taking off regular lower body clothing?: A Lot 6 Click Score: 14    End of Session Equipment Utilized During Treatment: Gait belt;Rolling walker (2 wheels)  OT Visit Diagnosis: Unsteadiness on feet (R26.81);Other abnormalities of gait and mobility (R26.89);Muscle weakness (generalized) (M62.81)   Activity Tolerance Patient tolerated treatment well   Patient Left in chair;with call bell/phone within reach;with chair alarm set;Other (comment);with family/visitor present   Nurse Communication Mobility status        Time: 8495-8469 OT Time Calculation (min): 26 min  Charges: OT General Charges $OT Visit: 1 Visit OT Treatments $Self Care/Home Management : 23-37 mins  Anne [name], OTR/L Acute Rehabilitation Services Office [phone] Secure Chat Communication Preferred   Anne [name] 02/14/2023, 3:46 PM

## 2023-02-14 NOTE — Progress Notes (Addendum)
 HD#23 SUBJECTIVE:  Patient Summary: Anne [name] is a 73 yo patient with a history of dementia with depressed mood, HTN, HLD, hypothyroidism, and metastatic lung cancer (in remission) who presented after a seizure associated with subdural hematoma. Patient was admitted for management of seizure in the setting of evolving subdural hematoma due to fall.    Upon admission her bleed stabilized and there were no recurrent seizures having started Keppra . She was ready for discharge until an unwitnessed fall from bed on 12/21 induced rebleed of her subdural hematoma producing increased mass effect, R midline shift, and slight L uncal herniation. Neurologic status however was stable and she was not felt to be an operative candidate.   Since then, she remains medically stable and is awaiting appropriate placement to leave hospital. SNF was unfortunately denied by insurance, hospice is premature, but home environment has limited support. Social work is attempting to set up long-term care facility for this patient.   Overnight Events: None   Pt Concerns: She is comfortable in bed this morning. Was asleep this morning, awoke easily, somewhat sleepy still.  OBJECTIVE:  Vital Signs: Vitals:   02/13/23 1526 02/13/23 1941 02/14/23 0856 02/14/23 1216  BP: 131/74 129/75 131/82 118/81  Pulse: 82 94 82 83  Resp: 18 15 17 17   Temp:   98.4 F (36.9 C) 98 F (36.7 C)  TempSrc: Oral Oral Oral Oral  SpO2: 94% 93% 92% (!) 88%  Weight:      Height:       Supplemental O2: Room Air SpO2: (!) 88 % O2 Flow Rate (L/min): 2 L/min  Filed Weights   01/23/23 1417  Weight: 73.9 kg     Intake/Output Summary (Last 24 hours) at 02/14/2023 1314 Last data filed at 02/14/2023 1050 Gross per 24 hour  Intake 381 ml  Output 200 ml  Net 181 ml   Net IO Since Admission: 4,868.25 mL [02/14/23 1314]  Physical Exam: Physical Exam Constitutional:      General: She is not in acute distress.    Appearance: She is not  ill-appearing.  Cardiovascular:     Rate and Rhythm: Normal rate.     Pulses: Normal pulses.  Pulmonary:     Effort: Pulmonary effort is normal.  Musculoskeletal:     Right lower leg: No edema.     Left lower leg: No edema.  Skin:    General: Skin is warm.     Capillary Refill: Capillary refill takes less than 2 seconds. Well-healing contusion on L face, no lesions. Neurological:     Mental Status: She is alert. Mental status is at baseline.     Comments: Oriented to person only. Fully and appropriately responsive to questions. Answers with nods and occasional short phrases.   Patient Lines/Drains/Airways Status     Active Line/Drains/Airways     Name Placement date Placement time Site Days   Wound / Incision (Open or Dehisced) 10/29/18 Other (Comment) Leg Left;Posterior;Upper Boil 10/29/18  0058  Leg  1569   Wound / Incision (Open or Dehisced) 01/26/23 Laceration Head Posterior;Right;Upper laceration 01/26/23  1610  Head  19             ASSESSMENT/PLAN:  Assessment: Principal Problem:   Acute on chronic intracranial subdural hematoma (HCC) Active Problems:   Hypertension   Dementia (HCC)   Seizure (HCC)   Acute urinary retention  Anne [name] is a 73 yo patient with a history of dementia with depressed mood, HTN, HLD, hypothyroidism,  and metastatic lung cancer (in remission) who presented after a seizure associated with subdural hematoma. Patient was admitted for management of seizure in the setting of evolving subdural hematoma due to fall.    Upon admission her bleed stabilized and there were no recurrent seizures having started Keppra . She was ready for discharge until an unwitnessed fall from bed on 12/21 induced rebleed of her subdural hematoma producing increased mass effect, R midline shift, and slight L uncal herniation. Neurologic status however was stable and she was not felt to be an operative candidate.   Since then, she remains medically stable and is  awaiting appropriate placement to leave hospital. SNF was unfortunately denied by insurance, hospice is premature, but home environment has limited support. Social work is attempting to set up long-term care facility for this patient.   Plan: Subdural Hematoma secondary to fall from standing causing Seizures and Somnolence (Resolved) Previous fall from hospital bed causing rebleed, scalp laceration (Resolved) Mental status is stable. She was somnolent at admission. She is alert now. There are no focal deficits on her exam. She exhibits depressed affect and mood in the setting of her dementia and long hospital stay. Still, she responds to questions appropriately with short phrases or nods and responds to all prompting. Physical therapy continues to work with her. She cooperates. She advocates for herself. There are no seizures since her time in the ED. She is oriented to person only.   - Dispo is a challenge: SNF placement denied, appeal denied. Discharge straight to home is risky given poor balance and dementia and limited support. I dont believe she is at imminent end of life demanding hospice. Social is working on a long-term care facility for her. She will remain here and work with PT in the meantime. May ultimately require discharge home with PT/OT/HH.  - After fall from bed, CT scan 12/29 showed an 18 mm hematoma and 9 mm rightward shift.  There was also new uncal herniation present.  That said neuroexam remains stable.  Discussed with neurosurgeon Dr. Carollee who saw her earlier, will not pursue surgery, no indications even with change in neuro exam. - PLAN: Continue PO Keppra  100 mg q12H, continue to reorient as able, continue to get pt out of bed with proper safety/oversight    Urinary retention, resolved Bladder scans and in/out cath if needed. Earlier UA unremarkable.   Dementia Dementia is significant enough to require daily care from her husband and family.  That said she is able to respond  to commands, communicate, work with physical therapy, move about the room with assistance.  Continue to recommend family presence and orientation strategies (orienting patient to day, time, place, keeping blinds open during the day, getting meals regularly).  - Memantine  10 every day and rivastigmine  3 BID, home doses. Depakote  and Seroquel  home meds are not needed and should not be continued.   Hemorrhoids Examination deferred. Preparation H was given, now ordered twice daily, and appears to have relieved the patient. - Preparation H rectal ointment BID   HTN Goal SBP is below 150 per evolving subdural hematoma. Blood pressures have required decreasing meds/doses and at this point we are stopping them to prevent hypotension. - Hold Amlodipine  5 mg daily, Irbesartan  300 mg daily given low BPs    Thrombocytopenia- resolved Holding home Depakote . Platelet count WNL at 162 1/1. - Monitor    Depression/Anxiety Bupropion  has been stopped indefinitely due to seizures.  Resume home dose sertraline  150 mg.   Hypothyroidism  Resume synthroid  88 mcg daily. She has appeared overcorrected recently and TSH can be repeated at next lab draw.     HLD Resume rosuvastatin  20 mg daily, though continuation should be done with goals in mind.  If goals are for quality of life rather than longevity, a discussion about stopping would be appropriate.    Best Practice: Diet: Dys 2, thin fluids, Ensure for feeding supplement IVF: prn VTE: SCDs Start: 01/22/23 1400 Code: DNR/DNI AB: None Therapy Recs: SNF DISPO: Anticipated discharge to home vs LTC pending placement  Signature: Lonni Africa, D.O.  Internal Medicine Resident, PGY-1 [facility]  Pager: [phone] 1:14 PM, 02/14/2023   Please contact the on call pager after 5 pm and on weekends at [phone].

## 2023-02-14 NOTE — TOC Progression Note (Signed)
 Transition of Care [facility]) - Progression Note    Patient Details  Name: Anne [name] MRN: [id number] Date of Birth: 11/14/1950  Transition of Care [facility]) CM/SW Contact  Almarie CHRISTELLA Goodie, [name] Phone Number: 02/14/2023, 2:45 PM  Clinical Narrative:   CSW continuing to follow for SNF placement. Continuing to await pending Medicaid number to move forward with possible LOG placement. CSW met with patient's spouse at bedside to provide update and answer questions. CSW to follow.    Expected Discharge Plan: Skilled Nursing Facility Barriers to Discharge: Insurance Authorization, SNF Pending Medicaid, SNF Pending bed offer, SNF Pending payor source - LOG  Expected Discharge Plan and Services In-house Referral: Clinical Social Work   Post Acute Care Choice: Skilled Nursing Facility Living arrangements for the past 2 months: Single Family Home                                       Social Determinants of Health (SDOH) Interventions SDOH Screenings   Food Insecurity: Patient Unable To Answer (01/22/2023)  Housing: Patient Unable To Answer (01/22/2023)  Transportation Needs: Patient Unable To Answer (01/22/2023)  Utilities: Patient Unable To Answer (01/22/2023)  Depression (PHQ2-9): High Risk (06/21/2022)  Financial Resource Strain: Low Risk  (09/08/2020)  Physical Activity: Unknown (10/29/2018)  Social Connections: Unknown (02/06/2023)  Stress: No Stress Concern Present (09/08/2020)  Tobacco Use: Medium Risk (01/22/2023)    Readmission Risk Interventions     No data to display

## 2023-02-14 NOTE — Plan of Care (Signed)
   Problem: Safety: Goal: Ability to remain free from injury will improve Outcome: Progressing

## 2023-02-15 DIAGNOSIS — I6203 Nontraumatic chronic subdural hemorrhage: Secondary | ICD-10-CM | POA: Diagnosis not present

## 2023-02-15 DIAGNOSIS — I6201 Nontraumatic acute subdural hemorrhage: Secondary | ICD-10-CM | POA: Diagnosis not present

## 2023-02-15 LAB — BASIC METABOLIC PANEL
Anion gap: 8 (ref 5–15)
BUN: 24 mg/dL — ABNORMAL HIGH (ref 8–23)
CO2: 30 mmol/L (ref 22–32)
Calcium: 9.3 mg/dL (ref 8.9–10.3)
Chloride: 101 mmol/L (ref 98–111)
Creatinine, Ser: 1 mg/dL (ref 0.44–1.00)
GFR, Estimated: 59 mL/min — ABNORMAL LOW (ref >60)
Glucose, Bld: 85 mg/dL (ref 70–99)
Potassium: 4.6 mmol/L (ref 3.5–5.1)
Sodium: 139 mmol/L (ref 135–145)

## 2023-02-15 LAB — CBC
HCT: 36.5 % (ref 36.0–46.0)
Hemoglobin: 11.7 g/dL — ABNORMAL LOW (ref 12.0–15.0)
MCH: 29.8 pg (ref 26.0–34.0)
MCHC: 32.1 g/dL (ref 30.0–36.0)
MCV: 92.9 fL (ref 80.0–100.0)
Platelets: 126 10*3/uL — ABNORMAL LOW (ref 150–400)
RBC: 3.93 MIL/uL (ref 3.87–5.11)
RDW: 15.8 % — ABNORMAL HIGH (ref 11.5–15.5)
WBC: 5.6 10*3/uL (ref 4.0–10.5)
nRBC: 0 % (ref 0.0–0.2)

## 2023-02-15 LAB — TSH: TSH: 53.602 u[IU]/mL — ABNORMAL HIGH (ref 0.350–4.500)

## 2023-02-15 NOTE — Plan of Care (Signed)

## 2023-02-15 NOTE — Progress Notes (Signed)
 Physical Therapy Treatment Patient Details Name: Anne [name] MRN: [id number] DOB: 08-16-1950 Today's Date: 02/15/2023   History of Present Illness Patient is a 73 year old female admitted 12/17 with new onset of seizures in the setting of underlying SDH. Fall in hospital with increased size of R SDH on CT. No change in neuro status and MD/family decided against any surgery. History of dementia, lung cancer, seizures, hypothyroid.    PT Comments  Pt eager for OOB mobility on arrival, verbalizing need to use bathroom. Pt requiring CGA for bed mobility for safety and up to min A for transfers to stand from low commode and cues for hand placement on rise from EOB and standard chair but no physical assist needed from those surfaces. Pt continues to require up to mod A for gait with RW support as pt with increased R drift and increased weakness with fatigue, resulting in decreased RLE clearance and increased R lateral lean with increased ambulation distance. Current plan remains appropriate to address deficits and maximize functional independence and decrease caregiver burden. Pt continues to benefit from skilled PT services to progress toward functional mobility goals.     If plan is discharge home, recommend the following: Supervision due to cognitive status;Help with stairs or ramp for entrance;Assist for transportation;Direct supervision/assist for medications management;Direct supervision/assist for financial management;Assistance with cooking/housework;A little help with walking and/or transfers;A little help with bathing/dressing/bathroom   Can travel by private vehicle     No  Equipment Recommendations  None recommended by PT    Recommendations for Other Services       Precautions / Restrictions Precautions Precautions: Fall Precaution Comments: Systolic blood pressure goal below 150; watch for orthostasis Restrictions Weight Bearing Restrictions Per Provider Order: No      Mobility  Bed Mobility Overal bed mobility: Needs Assistance Bed Mobility: Supine to Sit     Supine to sit: Contact guard Sit to supine: Contact guard assist   General bed mobility comments: CGA for safety, pt reaching for HHA to eleavte trunk but able to complete without assist    Transfers Overall transfer level: Needs assistance Equipment used: Rolling walker (2 wheels) Transfers: Sit to/from Stand Sit to Stand: Min assist, Contact guard assist           General transfer comment: cues for hand placement and light min A to boost to rise from low commode, no assist from EOB and standard chair    Ambulation/Gait Ambulation/Gait assistance: Min assist, Mod assist Gait Distance (Feet): 15 Feet (+ 51') Assistive device: Rolling walker (2 wheels) Gait Pattern/deviations: Step-through pattern, Decreased step length - right, Decreased step length - left, Decreased stride length, Narrow base of support, Trunk flexed Gait velocity: decreased     General Gait Details: min A to manage RW as pt drifitng R due to inattention and bumping obstacles in R environment, min A also needed to maintain balance as pt with increased R lateral lean and decreased RLE clearance with fatigue   Stairs             Wheelchair Mobility     Tilt Bed    Modified Rankin (Stroke Patients Only)       Balance Overall balance assessment: Needs assistance, History of Falls Sitting-balance support: Feet supported, No upper extremity supported Sitting balance-Leahy Scale: Fair     Standing balance support: Bilateral upper extremity supported, During functional activity Standing balance-Leahy Scale: Poor Standing balance comment: Mild instability but no overt LOB.  Cognition Arousal: Alert Behavior During Therapy: Flat affect Overall Cognitive Status: Impaired/Different from baseline Area of Impairment: Attention, Memory, Following commands,  Safety/judgement, Awareness, Problem solving                 Orientation Level: Disoriented to, Place, Time, Situation Current Attention Level: Focused Memory: Decreased short-term memory Following Commands: Follows one step commands inconsistently Safety/Judgement: Decreased awareness of safety, Decreased awareness of deficits Awareness: Intellectual Problem Solving: Slow processing, Decreased initiation, Difficulty sequencing, Requires verbal cues, Requires tactile cues General Comments: R inattention, slowed command following. benefits from simple 1 step cues. able to state need of up to bathroom on arrival, otherwise no verbalizations        Exercises      General Comments General comments (skin integrity, edema, etc.): VSS      Pertinent Vitals/Pain Pain Assessment Pain Assessment: Faces Faces Pain Scale: No hurt Pain Intervention(s): Monitored during session    Home Living                          Prior Function            PT Goals (current goals can now be found in the care plan section) Acute Rehab PT Goals PT Goal Formulation: With patient Time For Goal Achievement: 02/07/23 Progress towards PT goals: Progressing toward goals    Frequency    Min 1X/week      PT Plan      Co-evaluation              AM-PAC PT 6 Clicks Mobility   Outcome Measure  Help needed turning from your back to your side while in a flat bed without using bedrails?: A Little Help needed moving from lying on your back to sitting on the side of a flat bed without using bedrails?: A Little Help needed moving to and from a bed to a chair (including a wheelchair)?: A Little Help needed standing up from a chair using your arms (e.g., wheelchair or bedside chair)?: A Little Help needed to walk in hospital room?: A Lot Help needed climbing 3-5 steps with a railing? : Total 6 Click Score: 15    End of Session Equipment Utilized During Treatment: Gait  belt Activity Tolerance: Patient limited by fatigue Patient left: with call bell/phone within reach;in bed;with bed alarm set;Other (comment);with nursing/sitter in room (with fall mats placed, e-sitter) Nurse Communication: Mobility status PT Visit Diagnosis: Other abnormalities of gait and mobility (R26.89);Difficulty in walking, not elsewhere classified (R26.2);Other symptoms and signs involving the nervous system (R29.898);History of falling (Z91.81);Unsteadiness on feet (R26.81);Muscle weakness (generalized) (M62.81)     Time: 9150-9093 PT Time Calculation (min) (ACUTE ONLY): 17 min  Charges:    $Gait Training: 8-22 mins PT General Charges $$ ACUTE PT VISIT: 1 Visit                     Britania Shreeve R. PTA Acute Rehabilitation Services Office: [phone]   Therisa CHRISTELLA Boor 02/15/2023, 9:21 AM

## 2023-02-16 DIAGNOSIS — I6203 Nontraumatic chronic subdural hemorrhage: Secondary | ICD-10-CM | POA: Diagnosis not present

## 2023-02-16 DIAGNOSIS — I6201 Nontraumatic acute subdural hemorrhage: Secondary | ICD-10-CM | POA: Diagnosis not present

## 2023-02-16 NOTE — Progress Notes (Signed)
 HD#25 SUBJECTIVE:  Patient Summary: Anne [name] is a 72 yo patient with a history of dementia with depressed mood, HTN, HLD, hypothyroidism, and metastatic lung cancer (in remission) who presented after a seizure associated with subdural hematoma. Patient was admitted for management of seizure in the setting of evolving subdural hematoma due to fall.    Upon admission her bleed stabilized and there were no recurrent seizures having started Keppra . She was ready for discharge until an unwitnessed fall from bed on 12/21 induced rebleed of her subdural hematoma producing increased mass effect, R midline shift, and slight L uncal herniation. Neurologic status however was stable and she was not felt to be an operative candidate.   Since then, she remains medically stable and is awaiting appropriate placement to leave hospital. SNF was unfortunately denied by insurance, hospice is premature, but home environment has limited support. Social work is attempting to set up long-term care facility for this patient.   Overnight Events: None  Interim History: Resting in bed. Appears sleepy. Reports being chilly. Denies pain or discomfort.  OBJECTIVE:  Vital Signs: Vitals:   02/15/23 1957 02/16/23 0000 02/16/23 0401 02/16/23 0808  BP: 119/74 129/79 138/83 131/86  Pulse: 84 78 76 70  Resp: 16 16 16 17   Temp: 98.6 F (37 C) 98 F (36.7 C) 98 F (36.7 C) 97.6 F (36.4 C)  TempSrc: Axillary Axillary Axillary Axillary  SpO2: 90% 93% 92% 93%  Weight:      Height:       Supplemental O2: Room Air SpO2: 93 % O2 Flow Rate (L/min): 1 L/min  Filed Weights   01/23/23 1417  Weight: 73.9 kg     Intake/Output Summary (Last 24 hours) at 02/16/2023 1019 Last data filed at 02/16/2023 0300 Gross per 24 hour  Intake --  Output 300 ml  Net -300 ml   Net IO Since Admission: 4,568.25 mL [02/16/23 1019]  Physical Exam: Physical Exam Constitutional:      General: She is not in acute distress.     Appearance: She is not ill-appearing.  Cardiovascular:     Rate and Rhythm: Normal rate.     Pulses: Normal pulses.  Pulmonary:     Effort: Pulmonary effort is normal.  Musculoskeletal:     Right lower leg: No edema.     Left lower leg: No edema.  Skin:    General: Skin is warm.     Capillary Refill: Capillary refill takes less than 2 seconds. Well-healing contusion on L face, no lesions. Neurological:     Mental Status: She is alert. Mental status is at baseline.     Comments: Oriented to person only. Fully and appropriately responsive to questions. Answers with nods and occasional short phrases.   Patient Lines/Drains/Airways Status     Active Line/Drains/Airways     Name Placement date Placement time Site Days   Wound / Incision (Open or Dehisced) 10/29/18 Other (Comment) Leg Left;Posterior;Upper Boil 10/29/18  0058  Leg  1571   Wound / Incision (Open or Dehisced) 01/26/23 Laceration Head Posterior;Right;Upper laceration 01/26/23  1610  Head  21             ASSESSMENT/PLAN:  Assessment: Principal Problem:   Acute on chronic intracranial subdural hematoma (HCC) Active Problems:   Hypertension   Dementia (HCC)   Seizure (HCC)   Acute urinary retention  Anne [name] is a 73 yo patient with a history of dementia with depressed mood, HTN, HLD, hypothyroidism, and metastatic  lung cancer (in remission) who presented after a seizure associated with subdural hematoma. Patient was admitted for management of seizure in the setting of evolving subdural hematoma due to fall.    Upon admission her bleed stabilized and there were no recurrent seizures having started Keppra . She was ready for discharge until an unwitnessed fall from bed on 12/21 induced rebleed of her subdural hematoma producing increased mass effect, R midline shift, and slight L uncal herniation. Neurologic status however was stable and she was not felt to be an operative candidate.   Since then, she remains  medically stable and is awaiting appropriate placement to leave hospital. SNF was unfortunately denied by insurance, hospice is premature, but home environment has limited support. Social work is attempting to set up long-term care facility for this patient.   Plan: Subdural Hematoma secondary to fall from standing causing Seizures and Somnolence (Resolved) Previous fall from hospital bed causing rebleed, scalp laceration (Resolved) Mental status is stable. She was somnolent at admission. She is alert now. There are no focal deficits on her exam. She exhibits depressed affect and mood in the setting of her dementia and long hospital stay. Still, she responds to questions appropriately with short phrases or nods and responds to all prompting. Physical therapy continues to work with her. She cooperates. She advocates for herself. There are no seizures since her time in the ED. She is oriented to person only.  - After fall from bed, CT scan 12/29 showed an 18 mm hematoma and 9 mm rightward shift.  There was also new uncal herniation present.  That said neuroexam remains stable.  Discussed with neurosurgeon Dr. Carollee who saw her earlier, will not pursue surgery, no indications even with change in neuro exam. - PLAN: Continue PO Keppra  100 mg q12H, continue to reorient as able, continue to get pt out of bed with proper safety/oversight     Dementia Dementia is significant enough to require daily care from her husband and family.  That said she is able to respond to commands, communicate, work with physical therapy, move about the room with assistance.  Continue to recommend family presence and orientation strategies (orienting patient to day, time, place, keeping blinds open during the day, getting meals regularly).  - Memantine  10 every day and rivastigmine  3 BID, home doses. Depakote  and Seroquel  home meds are not needed and should not be continued.   Hemorrhoids Examination deferred. Preparation H was  given, now ordered twice daily, and appears to have relieved the patient. - Preparation H rectal ointment BID   Urinary retention, resolved Bladder scans and in/out cath if needed. Earlier UA unremarkable.  HTN Goal SBP is below 150 per evolving subdural hematoma. Blood pressures have required decreasing meds/doses and at this point we are stopping them to prevent hypotension. - Hold Amlodipine  5 mg daily, Irbesartan  300 mg daily given steady BPs    Thrombocytopenia- resolved Holding home Depakote . Platelet count WNL at 162 1/1. - Monitor    Depression/Anxiety Bupropion  has been stopped indefinitely due to seizures.  Resume home dose sertraline  150 mg.   Hypothyroidism Resume synthroid  88 mcg daily. She has appeared overcorrected recently and TSH will be repeated today.   HLD Resume rosuvastatin  20 mg daily, though continuation should be done with goals in mind.  If goals are for quality of life rather than longevity, a discussion about stopping would be appropriate.    Best Practice: Diet: Dys 2, thin fluids, Ensure for feeding supplement IVF: prn VTE:  SCDs Start: 01/22/23 1400 Code: DNR/DNI AB: None Therapy Recs: SNF DISPO: Anticipated discharge to home vs LTC pending placement - Dispo is a challenge: SNF placement denied, appeal denied. Discharge straight to home is risky given poor balance and dementia and limited support. I dont believe she is at imminent end of life demanding hospice. Social is working on a long-term care facility for her. She will remain here and work with PT in the meantime. May ultimately require discharge home with PT/OT/HH.   Signature: Lonni Africa, D.O.  Internal Medicine Resident, PGY-1 [facility]  Pager: [phone] 10:19 AM, 02/16/2023   Please contact the on call pager after 5 pm and on weekends at [phone].

## 2023-02-16 NOTE — Progress Notes (Addendum)
 HD#25 SUBJECTIVE:  Patient Summary: Anne [name] is a 73 yo patient with a history of dementia with depressed mood, HTN, HLD, hypothyroidism, and metastatic lung cancer (in remission) who presented after a seizure associated with subdural hematoma. Patient was admitted for management of seizure in the setting of evolving subdural hematoma due to fall.    Upon admission her bleed stabilized and there were no recurrent seizures having started Keppra . She was ready for discharge until an unwitnessed fall from bed on 12/21 induced rebleed of her subdural hematoma producing increased mass effect, R midline shift, and slight L uncal herniation. Neurologic status however was stable and she was not felt to be an operative candidate.   Since then, she remains medically stable and is awaiting appropriate placement to leave hospital. SNF was unfortunately denied by insurance, hospice is premature, but home environment has limited support. Social work is attempting to set up long-term care facility for this patient.  Overnight Events: None  Interim History: Doing well this morning. Far more talkative than usual. She even joked and smiled with us . She reports a strong desire to go home. She denies pain or discomfort.  OBJECTIVE:  Vital Signs: Vitals:   02/15/23 1957 02/16/23 0000 02/16/23 0401 02/16/23 0808  BP: 119/74 129/79 138/83 131/86  Pulse: 84 78 76 70  Resp: 16 16 16 17   Temp: 98.6 F (37 C) 98 F (36.7 C) 98 F (36.7 C) 97.6 F (36.4 C)  TempSrc: Axillary Axillary Axillary Axillary  SpO2: 90% 93% 92% 93%  Weight:      Height:       Supplemental O2: Room Air SpO2: 93 % O2 Flow Rate (L/min): 1 L/min  Filed Weights   01/23/23 1417  Weight: 73.9 kg     Intake/Output Summary (Last 24 hours) at 02/16/2023 0819 Last data filed at 02/16/2023 0300 Gross per 24 hour  Intake --  Output 300 ml  Net -300 ml   Net IO Since Admission: 4,568.25 mL [02/16/23 0819]  Physical  Exam: Physical Exam Constitutional:      General: She is not in acute distress.    Appearance: She is not ill-appearing.  Cardiovascular:     Rate and Rhythm: Normal rate.     Pulses: Normal pulses.  Pulmonary:     Effort: Pulmonary effort is normal.  Musculoskeletal:     Right lower leg: No edema.     Left lower leg: No edema.  Skin:    General: Skin is warm.     Capillary Refill: Capillary refill takes less than 2 seconds. Well-healing contusion on L face, no lesions. Neurological:     Mental Status: She is alert. Mental status is at baseline.     Comments: Oriented to person only. Fully and appropriately responsive to questions. Answers with nods and occasional short phrases.   Patient Lines/Drains/Airways Status     Active Line/Drains/Airways     Name Placement date Placement time Site Days   Wound / Incision (Open or Dehisced) 10/29/18 Other (Comment) Leg Left;Posterior;Upper Boil 10/29/18  0058  Leg  1571   Wound / Incision (Open or Dehisced) 01/26/23 Laceration Head Posterior;Right;Upper laceration 01/26/23  1610  Head  21             ASSESSMENT/PLAN:  Assessment: Principal Problem:   Acute on chronic intracranial subdural hematoma (HCC) Active Problems:   Hypertension   Dementia (HCC)   Seizure (HCC)   Acute urinary retention  Anne [name] is a  73 yo patient with a history of dementia with depressed mood, HTN, HLD, hypothyroidism, and metastatic lung cancer (in remission) who presented after a seizure associated with subdural hematoma. Patient was admitted for management of seizure in the setting of evolving subdural hematoma due to fall.    Upon admission her bleed stabilized and there were no recurrent seizures having started Keppra . She was ready for discharge until an unwitnessed fall from bed on 12/21 induced rebleed of her subdural hematoma producing increased mass effect, R midline shift, and slight L uncal herniation. Neurologic status however was  stable and she was not felt to be an operative candidate.   Since then, she remains medically stable and is awaiting appropriate placement to leave hospital. SNF was unfortunately denied by insurance, hospice is premature, but home environment has limited support. Social work is attempting to set up long-term care facility for this patient.   Plan: Subdural Hematoma secondary to fall from standing causing Seizures and Somnolence (Resolved) Previous fall from hospital bed causing rebleed, scalp laceration (Resolved) Mental status is stable. She was very expressive today, both joking and asking to go home. She was somnolent at admission. She is alert now. There are no focal deficits on her exam. She exhibits depressed affect and mood in the setting of her dementia and long hospital stay. Still, she responds to questions appropriately with short phrases or nods and responds to all prompting. Physical therapy continues to work with her. She cooperates. She advocates for herself. There are no seizures since her time in the ED. She is oriented to person only.   - After fall from bed, CT scan 12/29 showed an 18 mm hematoma and 9 mm rightward shift.  There was also new uncal herniation present.  That said neuroexam remains stable.  Discussed with neurosurgeon Dr. Carollee who saw her earlier, will not pursue surgery, non-operative even with change in neuro exam. - PLAN: Continue PO Keppra  100 mg q12H, continue to reorient as able, continue to get pt out of bed with proper safety/oversight - As her alertness improves, will ask PT for another eval for dispo. They continue to work with her every other day.   Dementia Dementia is significant enough to require daily care from her husband and family.  That said she is able to respond to commands, communicate, work with physical therapy, move about the room with assistance.  Continue to recommend family presence and orientation strategies (orienting patient to day,  time, place, keeping blinds open during the day, getting meals regularly).  - Memantine  10 every day and rivastigmine  3 BID, home doses. Depakote  and Seroquel  home meds are not needed and should not be continued.     Urinary retention, resolved Bladder scans and in/out cath if needed. Earlier UA unremarkable.  Hemorrhoids Examination deferred. Preparation H was given, now ordered twice daily, and appears to have relieved the patient. - Preparation H rectal ointment BID   HTN Goal SBP is below 150 per evolving subdural hematoma. Blood pressures have required decreasing meds/doses and at this point we are stopping them to prevent hypotension. - Hold Amlodipine  5 mg daily, Irbesartan  300 mg daily given low BPs    Thrombocytopenia- resolved Holding home Depakote . Platelet count WNL at 162 1/1. - Monitor    Depression/Anxiety Bupropion  stopped indefinitely due to seizures.  Receiving home dose sertraline  150 mg.   Hypothyroidism Home synthroid  , appeared overcorrected, reduced to 88 mcg daily, now undertreated.   - Repeat TSH 53 on 1/10.  HLD Resume rosuvastatin  20 mg daily, though continuation should be done with goals in mind.  If goals are for quality of life rather than longevity, a discussion about stopping would be appropriate.    Best Practice: Diet: Dys 2, thin fluids, Ensure for feeding supplement IVF: prn VTE: SCDs Start: 01/22/23 1400 Code: DNR/DNI AB: None Therapy Recs: SNF DISPO: Anticipated discharge to home vs LTC pending placement - Safe discharge is a barrier: SNF placement denied, appeal denied. Discharge straight to home is risky given poor balance and dementia and limited support. I dont believe she is at imminent end of life demanding hospice. Social is working on a long-term care facility for her. She will remain here and work with PT in the meantime. May ultimately require discharge home with PT/OT/HH.   Signature: Lonni Africa, D.O.  Internal  Medicine Resident, PGY-1 [facility]  Pager: [phone] 8:19 AM, 02/16/2023   Please contact the on call pager after 5 pm and on weekends at [phone].

## 2023-02-16 NOTE — Plan of Care (Signed)
  Problem: Clinical Measurements: Goal: Respiratory complications will improve Outcome: Progressing Goal: Cardiovascular complication will be avoided Outcome: Progressing   Problem: Activity: Goal: Risk for activity intolerance will decrease Outcome: Progressing   Problem: Nutrition: Goal: Adequate nutrition will be maintained Outcome: Progressing   Problem: Elimination: Goal: Will not experience complications related to bowel motility Outcome: Progressing Goal: Will not experience complications related to urinary retention Outcome: Progressing   Problem: Skin Integrity: Goal: Risk for impaired skin integrity will decrease Outcome: Progressing

## 2023-02-17 NOTE — Progress Notes (Addendum)
 HD#26 SUBJECTIVE:  Patient Summary: Anne [name] is a 73 yo patient with a history of dementia with depressed mood, HTN, HLD, hypothyroidism, and metastatic lung cancer (in remission) who presented after a seizure associated with subdural hematoma. Patient was admitted for management of seizure in the setting of evolving subdural hematoma due to fall.    Upon admission her bleed stabilized and there were no recurrent seizures having started Keppra . She was ready for discharge until an unwitnessed fall from bed on 12/21 induced rebleed of her subdural hematoma producing increased mass effect, R midline shift, and slight L uncal herniation. Neurologic status however was stable and she was not felt to be an operative candidate.   Since then, she remains medically stable and is awaiting appropriate placement to leave hospital. SNF was unfortunately denied by insurance, hospice is premature, but home environment has limited support. Social work is attempting to set up long-term care facility for this patient.  Overnight Events: None  Interim History: Pt seen bedside this AM. States she is feeling well, and is currently eating breakfast.   OBJECTIVE:  Vital Signs: Vitals:   02/16/23 2313 02/17/23 0355 02/17/23 0823 02/17/23 1158  BP: 124/68 113/62 118/79 136/88  Pulse: 83 80 73 71  Resp: 18 16 18 19   Temp: 97.7 F (36.5 C) 98.3 F (36.8 C) 97.9 F (36.6 C) 97.9 F (36.6 C)  TempSrc: Oral Oral Oral Oral  SpO2: 91% 93% 92% 100%  Weight:      Height:       Supplemental O2: Room Air   Filed Weights   01/23/23 1417  Weight: 73.9 kg     Intake/Output Summary (Last 24 hours) at 02/17/2023 1210 Last data filed at 02/17/2023 9077 Gross per 24 hour  Intake 731 ml  Output --  Net 731 ml   Net IO Since Admission: 5,299.25 mL [02/17/23 1210]  Physical Exam: Physical Exam Constitutional:      General: She is not in acute distress.    Appearance: She is not ill-appearing.   Cardiovascular:     Rate and Rhythm: Normal rate.     Pulses: Normal pulses.  Pulmonary:     Effort: Pulmonary effort is normal.  Musculoskeletal:     Right lower leg: No edema.     Left lower leg: No edema.  Skin:    General: Skin is warm.     Capillary Refill: Capillary refill takes less than 2 seconds. Well-healing contusion on L face, no lesions. Neurological:     Mental Status: She is alert. Mental status is at baseline.     Comments: Oriented to person only. Fully and appropriately responsive to questions. Answers with nods and occasional short phrases.   Patient Lines/Drains/Airways Status     Active Line/Drains/Airways     Name Placement date Placement time Site Days   Wound / Incision (Open or Dehisced) 10/29/18 Other (Comment) Leg Left;Posterior;Upper Boil 10/29/18  0058  Leg  1571   Wound / Incision (Open or Dehisced) 01/26/23 Laceration Head Posterior;Right;Upper laceration 01/26/23  1610  Head  21             ASSESSMENT/PLAN:  Assessment: Principal Problem:   Acute on chronic intracranial subdural hematoma (HCC) Active Problems:   Hypertension   Dementia (HCC)   Seizure (HCC)   Acute urinary retention  Anne [name] is a 73 yo patient with a history of dementia with depressed mood, HTN, HLD, hypothyroidism, and metastatic lung cancer (in remission) who  presented after a seizure associated with subdural hematoma. Patient was admitted for management of seizure in the setting of evolving subdural hematoma due to fall.    Upon admission her bleed stabilized and there were no recurrent seizures having started Keppra . She was ready for discharge until an unwitnessed fall from bed on 12/21 induced rebleed of her subdural hematoma producing increased mass effect, R midline shift, and slight L uncal herniation. Neurologic status however was stable and she was not felt to be an operative candidate.   Since then, she remains medically stable and is awaiting  appropriate placement to leave hospital. SNF was unfortunately denied by insurance, hospice is premature, but home environment has limited support. Social work is attempting to set up long-term care facility for this patient.   Plan: Subdural Hematoma secondary to fall from standing causing Seizures and Somnolence (Resolved) Previous fall from hospital bed causing rebleed, scalp laceration (Resolved) Mental status is stable. She was very expressive today, both joking and asking to go home. She was somnolent at admission. She is alert now. There are no focal deficits on her exam. She exhibits depressed affect and mood in the setting of her dementia and long hospital stay. Still, she responds to questions appropriately with short phrases or nods and responds to all prompting. Physical therapy continues to work with her. She cooperates. She advocates for herself. There are no seizures since her time in the ED. She is oriented to person only.   - After fall from bed, CT scan 12/29 showed an 18 mm hematoma and 9 mm rightward shift.  There was also new uncal herniation present.  That said neuroexam remains stable.  Discussed with neurosurgeon Dr. Carollee who saw her earlier, will not pursue surgery, non-operative even with change in neuro exam. - PLAN:  - Continue PO Keppra  100 mg q12H, continue to reorient as able, continue to get pt out of bed with proper safety/oversight  - As her alertness improves, will ask PT for another eval for dispo. They continue to work with her every other day. - Her and her husband are adamant about returning home, will need to discuss with PT as well as TOC to ensure she has the proper supplies that she needs.    Dementia Dementia is significant enough to require daily care from her husband and family.  That said she is able to respond to commands, communicate, work with physical therapy, move about the room with assistance.  Continue to recommend family presence and  orientation strategies (orienting patient to day, time, place, keeping blinds open during the day, getting meals regularly).  - Memantine  10 every day and rivastigmine  3 BID, home doses. Depakote  and Seroquel  home meds are not needed and should not be continued.     Urinary retention, resolved Bladder scans and in/out cath if needed. Earlier UA unremarkable.  Hemorrhoids Examination deferred. Preparation H was given, now ordered twice daily, and appears to have relieved the patient. - Preparation H rectal ointment BID   HTN Goal SBP is below 150 per evolving subdural hematoma. Blood pressures have required decreasing meds/doses and at this point we are stopping them to prevent hypotension. - Hold Amlodipine  5 mg daily, Irbesartan  300 mg daily given low BPs    Thrombocytopenia- resolved Holding home Depakote . Platelet count WNL at 162 1/1. - Monitor    Depression/Anxiety Bupropion  stopped indefinitely due to seizures.  Receiving home dose sertraline  150 mg.   Hypothyroidism Home synthroid  , appeared overcorrected, reduced to  88 mcg daily, now undertreated.   - Repeat TSH 53 on 1/10.   HLD Resume rosuvastatin  20 mg daily, though continuation should be done with goals in mind.  If goals are for quality of life rather than longevity, a discussion about stopping would be appropriate.    Best Practice: Diet: Dys 2, thin fluids, Ensure for feeding supplement IVF: prn VTE: SCDs Start: 01/22/23 1400 Code: DNR/DNI AB: None Therapy Recs: SNF DISPO: Anticipated discharge to home vs LTC pending placement - Safe discharge is a barrier: SNF placement denied, appeal denied. Discharge straight to home is risky given poor balance and dementia and limited support. I dont believe she is at imminent end of life demanding hospice. Social is working on a long-term care facility for her. She will remain here and work with PT in the meantime. May ultimately require discharge home with PT/OT/HH.    Signature: Emani Taussig, MD  Internal Medicine Resident, PGY-2 [facility]  Pager: [phone] 12:10 PM, 02/17/2023   Please contact the on call pager after 5 pm and on weekends at [phone].

## 2023-02-17 NOTE — Plan of Care (Signed)
 Plan of care is reviewed. Pt has been progressing. She is alert, oriented to self. Require repeatedly redirected and reoriented. She is not compliant with safety and fall precautions, continuing on Tele-sitter. She is stable hemodynamically, afebrile, normal respiratory efforts, no acute distress noted over night. She is able to get out of bed to bedside commode, short distance with one assistance with walker. Well tolerated, no SOB. We will continue to monitor.  Problem: Health Behavior/Discharge Planning: Goal: Ability to manage health-related needs will improve. Outcome: Progressing   Problem: Clinical Measurements: Goal: Ability to maintain clinical measurements within normal limits will improve Outcome: Progressing Goal: Will remain free from infection Outcome: Progressing Goal: Diagnostic test results will improve Outcome: Progressing Goal: Respiratory complications will improve Outcome: Progressing Goal: Cardiovascular complication will be avoided Outcome: Progressing   Problem: Activity: Goal: Risk for activity intolerance will decrease Outcome: Progressing   Problem: Nutrition: Goal: Adequate nutrition will be maintained Outcome: Progressing   Problem: Elimination: Goal: Will not experience complications related to bowel motility Outcome: Progressing Goal: Will not experience complications related to urinary retention Outcome: Progressing   Problem: Safety: Goal: Ability to remain free from injury will improve Outcome: Progressing   Wendi Dash, RN

## 2023-02-17 NOTE — Plan of Care (Signed)
  Problem: Clinical Measurements: Goal: Respiratory complications will improve Outcome: Progressing Goal: Cardiovascular complication will be avoided Outcome: Progressing   Problem: Nutrition: Goal: Adequate nutrition will be maintained Outcome: Progressing   Problem: Coping: Goal: Level of anxiety will decrease Outcome: Progressing   Problem: Elimination: Goal: Will not experience complications related to bowel motility Outcome: Progressing Goal: Will not experience complications related to urinary retention Outcome: Progressing   Problem: Skin Integrity: Goal: Risk for impaired skin integrity will decrease Outcome: Progressing

## 2023-02-18 ENCOUNTER — Other Ambulatory Visit (HOSPITAL_COMMUNITY): Payer: Self-pay

## 2023-02-18 DIAGNOSIS — I6203 Nontraumatic chronic subdural hemorrhage: Secondary | ICD-10-CM | POA: Diagnosis not present

## 2023-02-18 DIAGNOSIS — I6201 Nontraumatic acute subdural hemorrhage: Secondary | ICD-10-CM | POA: Diagnosis not present

## 2023-02-18 DIAGNOSIS — R569 Unspecified convulsions: Secondary | ICD-10-CM | POA: Diagnosis not present

## 2023-02-18 LAB — T4, FREE: Free T4: 0.49 ng/dL — ABNORMAL LOW (ref 0.61–1.12)

## 2023-02-18 LAB — TSH: TSH: 52.603 u[IU]/mL — ABNORMAL HIGH (ref 0.350–4.500)

## 2023-02-18 MED ORDER — LEVOTHYROXINE SODIUM 112 MCG PO TABS
112.0000 ug | ORAL_TABLET | Freq: Every day | ORAL | 0 refills | Status: DC
  Filled 2023-02-18: qty 30, 30d supply, fill #0

## 2023-02-18 MED ORDER — LEVOTHYROXINE SODIUM 112 MCG PO TABS
112.0000 ug | ORAL_TABLET | Freq: Every day | ORAL | Status: DC
Start: 2023-02-19 — End: 2023-02-18

## 2023-02-18 NOTE — Progress Notes (Signed)
 Discharge education/instructions provided by Leta Jungling, RN.   VSS. RA. Respirations are even and unlabored. NAD.   AVS and personal belongings sent with patient.

## 2023-02-18 NOTE — Plan of Care (Signed)

## 2023-02-18 NOTE — Discharge Summary (Addendum)
 Name: Anne [name] MRN: [id number] DOB: [birth year] 73 y.o. PCP: [name], MD  Date of Admission: 01/22/2023  6:41 AM Date of Discharge: 02/18/2023 Attending Physician: Dr. Lovie  Discharge Diagnosis: Principal Problem:   Acute on chronic intracranial subdural hematoma [facility]) Active Problems:   Hypertension   Dementia (HCC)   Seizure (HCC)   Acute urinary retention    Discharge Medications: Allergies as of 02/18/2023       Reactions   Bee Venom Swelling        Medication List     PAUSE taking these medications    amLODipine  5 MG tablet Wait to take this until your doctor or other care provider tells you to start again. Commonly known as: NORVASC  Take 1 tablet by mouth once daily       STOP taking these medications    buPROPion  300 MG 24 hr tablet Commonly known as: WELLBUTRIN  XL   divalproex  125 MG DR tablet Commonly known as: Depakote    QUEtiapine  25 MG tablet Commonly known as: SEROQUEL        TAKE these medications    acetaminophen  500 MG tablet Commonly known as: TYLENOL  Take 1,000 mg by mouth every 6 (six) hours as needed for moderate pain (pain score 4-6).   levETIRAcetam  1000 MG tablet Commonly known as: KEPPRA  Take 1 tablet (1,000 mg total) by mouth 2 (two) times daily. What changed:  medication strength how much to take   levothyroxine  112 MCG tablet Commonly known as: SYNTHROID  Take 1 tablet (112 mcg total) by mouth daily at 6 (six) AM. Start taking on: February 19, 2023 What changed: when to take this   memantine  10 MG tablet Commonly known as: Namenda  Take 1 tablet (10 mg total) by mouth 2 (two) times daily.   multivitamin tablet Take 1 tablet by mouth daily.   Nayzilam  5 MG/0.1ML Soln Generic drug: Midazolam  Place 5 mg into the nose as needed.   rivastigmine  3 MG capsule Commonly known as: EXELON  Take 1 capsule (3 mg total) by mouth 2 (two) times daily.   rosuvastatin  20 MG tablet Commonly known as:  CRESTOR  Take 1 tablet (20 mg total) by mouth daily.   sertraline  100 MG tablet Commonly known as: ZOLOFT  TAKE 1 & 1/2 (ONE & ONE-HALF) TABLETS BY MOUTH ONCE DAILY               Durable Medical Equipment  (From admission, onward)           Start     Ordered   02/18/23 1444  For home use only DME Walker rolling  Once       Question Answer Comment  Walker: With 5 Inch Wheels   Patient needs a walker to treat with the following condition Physical deconditioning      02/18/23 1448            Disposition and follow-up:   Anne [name] was discharged from [facility] in [place] condition.  At the hospital follow up visit please address:    Follow-up: Hypothyroidism Recent TSH elevated, decreased T4.  We have increased dose of levothyroxine  to 112 mcg.  - repeat TSH after 4-6 weeks, and dose levo as needled.  Mental status - Patient mostly alert to self, able to answer simple questions including yes/no questions. Attempts to use words to verbalize simple answers. Please assess that she is at her new baseline.   Feeding/swallowing - Patient placed on dysphagia 2 diet, ensure supplements,  and had staff assist patient with feeding to help improve PO intake. Please assess nutritional status and make adjustments as necessary.   BP control - Please assess blood pressure control in the setting of her subdural hematoma.  We are holding home amlodipine  and irbesartan , in the setting of subdural hematoma, with blood pressure goal less than 150.  Deconditioning, balance and gait - Patient will be working with PT/OT at home. Please assess for balance and gait issues and explore any interventions that may be appropriate.   dementia and anxiety/depression. -Holding Wellbutrin , Depakote  and Seroquel .  Restarted rivastigmine  3 mg twice daily, memantine  10 mg sertraline  150 mg. Please assess whether resuming any of these medications is indicated at time of  outpatient visit.   2.  Labs / imaging needed at time of follow-up: None.  3.  Pending labs/ test needing follow-up: CBC.  4.  Medication Changes:  New meds: Keppra  1000 mg twice daily Levothyroxine  112 mcg daily.  Follow-up Appointments:  Contact information for follow-up providers     [name], MD. Schedule an appointment as soon as possible for a visit.   Specialty: Family Medicine Contact information: [address] [name] [address] [phone]         [facility] Follow up.   Why: Representative from [name] will contact you to schedule start of home health services. Contact information: [address] [name] [address] [phone]              Contact information for after-discharge care     Destination     [facility] .   Service: Skilled Nursing Contact information: [address] [place]  [phone]                     Hospital Course by problem list:  Subdural Hematoma secondary to fall from standing with Seizures and Somnolence This is a patient who experienced a fall from standing at home on Saturday morning, 12/14 and she struck the left side of her face on a piece of furniture.  At that time ED workup revealed a subdural hematoma that was stable and not requiring hospitalization but for which she was started on prophylactic Keppra  500 twice daily in part due to previous hematoma with seizures in October and spontaneous seizures in January.    She had recurrent seizures despite taking Keppra  and evidence of worsening of her subdural hematoma upon CT at admission. In the ED she was newly somnolent, was not speaking, kept her eyes closed, and only responded to the command to squeeze both hands. Surgery was not required at that time, but overnight EEG was ordered due to further seizure activity in the ED. EEG showed cortical dysfunction of  the left hemisphere with diffuse encephalopathy. Patient did not have any witnessed seizures after being admitted. Per neurology, patient remained on PO Keppra  1000 mg BID and IV midazolam  2 mg q4H PRN for rescue. She improved over the course of her time here, and returned to an approximate baseline mental status, wherein she would respond to commands, and answer basic questions with short phrases. Systolic blood pressure goal below 150 was maintained.  Unfortunately on 12/21 she attempted to leave her bed and experienced an unwitnessed fall from bed that caused her to strike her head. This produced both a laceration on the back of her head that was repaired with 2 stitches but also caused a  rebleed of her hematoma, as evidenced by a stat CT.  Hematoma had increased from 1 to 1.3 cm and there was new right word midline shift of 6 mm. Patient and patient's family met to discuss with neurosurgery, decided against surgical intervention as she is not an operative candidate for craniotomy for evacuation. Patient's code status was changed from Full Code to DNR-Limited.   Efforts were made to discharge pt to SNF, but insurance denied this repeatedly. Efforts were made to get the patient medicaid and pursue long term care facilities, but with an uncertain timeline, pt and her husband opted for a discharge home after spending nearly 3 weeks pursuing alternate discharge.  At time of discharge somnolence was significantly improved. Patient responded to voice and was able to converse more clearly, though this was partially limited due to poor dentition. Patient could answer simple questions. Patient was able to follow instruction and could move all extremities.   Patient will be discharged on Keppra  1000 mg BID with neurology follow-up outpatient. Patient's neurosurgeon, Dr. Penne Sharps, is offering an outpatient follow-up in 2 weeks with repeat head CT to evaluate for any signs of liquefaction of the hematoma as this  can sometimes make it amenable to bur hole drainage which would be a much smaller intervention for her compared to a craniotomy for evacuation.   Due to their centrally acting mechanisms of action, recommend continuing to hold home medications like seroquel  25 mg daily at bedtime, depakote  375 mg TID, memantine  10 mg BID, and rivastigmine  3 mg BID. These medications were not given throughout hospitalization to help reduce the risk of confusion, somnolence, and seizure.    Dementia Progressive worsening over this past year. Functional decline began before her first fall and subdural hematoma in October. Present baseline is disorientation to time, frequent redirection, occasional irritability, but able to converse. She is not able to perform her own ADLs including grooming and cooking and lately her husband is helping her eat. Patient functional status acutely declined from baseline but improving. Recommend holding home memantine  10 mg daily and rivastigmine  3 mg and resuming outpatient as indicated.     HTN Goal SBP is below 150 per evolving subdural hematoma. Discontinued labetalol  during hospitalization due to bradycardia, and avoided nitroprusside due to increased ICP. During hospitalization patient required multiple doses of IV hydralazine  to bring BP closer to goal, in addition to her home amlodipine  10 and new hospital medicine irbesartan  300. As acute injury resolved, blood pressures became too low and all blood pressure medicines were stopped. Amlodipine  can be resumed at PCP's discretion.  Thrombocytopenia  CBC at admission showed platelets 123. This is most likely due to patient's home Depakote . This medication was held during hospitalization due to its centrally acting mechanism of action. Platelets on day of discharge were 126. Recommend outpatient CBC for monitoring.    Depression/Anxiety Held home Sertraline  150 mg daily and home Wellbutrin  300 mg in the setting of seizures. Recommend  reevaluating outpatient to determine if safe to resume.    Hypothyroidism Home dose was decreased to 88 mcg due to overcorrected TSH about 4 months ago. Repeat TSH,and free T4 suppressed. Will increase levothyroxine  dose to 112 mcg.  -follow up with PCP.   HLD Resumed rosuvastatin  20 mg daily at discharge.  Discharge Subjective: Patient evaluated at bedside this AM.  Husband at bedside, plan to discharge home with home health.  Discharge Exam:   BP 127/67 (BP Location: Right Arm)   Pulse 70  Temp 98 F (36.7 C) (Oral)   Resp 17   Ht 5' 8 (1.727 m)   Wt 73.9 kg   SpO2 96%   BMI 24.77 kg/m   Constitutional: well-appearing, hard of hearing, sitting comfortably in bed. HENT: normocephalic atraumatic, mucous membranes moist Cardiovascular: regular rate and rhythm, no m/r/g Pulmonary/Chest: normal work of breathing on room air, lungs clear to auscultation bilaterally Abdominal: soft, non-tender, non-distended Psych: Mood and affect appropriate.  Pertinent Labs, Studies, and Procedures:     Latest Ref Rng & Units 02/15/2023    7:20 AM 02/06/2023    7:12 AM 01/31/2023    6:16 AM  CBC  WBC 4.0 - 10.5 K/uL 5.6  9.2  8.7   Hemoglobin 12.0 - 15.0 g/dL 88.2  86.7  85.7   Hematocrit 36.0 - 46.0 % 36.5  39.9  43.4   Platelets 150 - 400 K/uL 126  162  207        Latest Ref Rng & Units 02/15/2023    7:20 AM 02/06/2023    7:12 AM 02/02/2023    4:54 AM  CMP  Glucose 70 - 99 mg/dL 85  88  94   BUN 8 - 23 mg/dL 24  37  31   Creatinine 0.44 - 1.00 mg/dL 8.99  9.09  9.07   Sodium 135 - 145 mmol/L 139  133  136   Potassium 3.5 - 5.1 mmol/L 4.6  4.1  4.6   Chloride 98 - 111 mmol/L 101  101  103   CO2 22 - 32 mmol/L 30  23  26    Calcium  8.9 - 10.3 mg/dL 9.3  8.9  9.3     Overnight EEG with video Result Date: 01/23/2023 Shelton Arlin KIDD, MD     01/24/2023  6:24 AM Patient Name: SHANTIA SANFORD MRN: [id number] Epilepsy Attending: Arlin KIDD Shelton Referring Physician/Provider: Shelton Arlin KIDD, MD Duration: 01/22/2023 1357 to 1433, 01/23/2023 0836 to 01/24/2023 0557 Patient history: 73yo F with left SDH, now with seizure. EEG to evaluate for seizure  Level of alertness: Awake, asleep  AEDs during EEG study: LEV  Technical aspects: This EEG study was done with scalp electrodes positioned according to the 10-20 International system of electrode placement. Electrical activity was reviewed with band pass filter of 1-70Hz , sensitivity of 7 uV/mm, display speed of 40mm/sec with a 60Hz  notched filter applied as appropriate. EEG data were recorded continuously and digitally stored. Video monitoring was available and reviewed as appropriate.  Description: No clear posterior dominant rhythm was seen.  Sleep was characterized by sleep symptoms (12 to 14 Hz), maximal frontocentral region. EEG showed continuous generalized and lateralized left hemisphere 3-5 Hz theta-delta slowing.   Hyperventilation and photic stimulation were not performed.   Study was not recorded between 12/70/2024 4033 to 01/13/2023 0730 due to technical issues Of note, parts of the study were difficult to interpret due to significant electrode artifact  ABNORMALITY -Continuous slow, generalized and lateralized left hemisphere  IMPRESSION: This technically difficult study is suggestive of cortical dysfunction in left hemisphere likely secondary to underlying structural abnormality.  Additionally there is moderate to severe diffuse encephalopathy. No seizures were noted.  Arlin KIDD Shelton    EEG adult Result Date: 01/22/2023 Shelton Arlin KIDD, MD     01/23/2023 10:08 AM Patient Name: NICKIE WARWICK MRN: [id number] Epilepsy Attending: Arlin KIDD Shelton Referring Physician/Provider: Judithe Rocky BROCKS, NP Date: 01/22/2023 Duration: 25.21 mins Patient history: 73yo F with left SDH, now with  seizure. EEG to evaluate for seizure Level of alertness: Awake, asleep AEDs during EEG study: LEV Technical aspects: This EEG study was done with scalp  electrodes positioned according to the 10-20 International system of electrode placement. Electrical activity was reviewed with band pass filter of 1-70Hz , sensitivity of 7 uV/mm, display speed of 66mm/sec with a 60Hz  notched filter applied as appropriate. EEG data were recorded continuously and digitally stored.  Video monitoring was available and reviewed as appropriate. Description: No clear posterior dominant rhythm was seen.  Sleep was characterized by sleep symptoms (12 to 14 Hz), maximal frontocentral region. EEG showed continuous generalized 3-5 Hz theta-delta slowing.  Between 1122 to 1124, lateralized periodic discharges were noted in left hemisphere, maximal left frontal region at 1Hz .  On video, patient was noted to be asleep, no clinical signs were noted.  Again around 1133, EEG showed rhythmic 2 to 3 Hz delta slowing admixed with lateralized periodic discharges in the left hemisphere, maximal left frontal region.  Per EEG tech, leg twitching was noted which was difficult to visualize on camera.  At 1136, EEG evolved into sharply contoured 5 to 6 Hz theta slowing admixed with spikes in left frontal region which then involve of left hemisphere and subsequently evolved into 2 to 3 Hz delta slowing.  This EEG pattern is consistent with focal seizure.  Again no definite clinical signs were noted due to limited visibility of video.  Duration of seizure was about 4 minutes Hyperventilation and photic stimulation were not performed.   ABNORMALITY -Focal seizure, left frontal region -Lateralized periodic discharges, left hemisphere, left maximal left frontal region -Continuous slow, generalized IMPRESSION: This study showed one focal seizure arising from left frontal region as described above on 01/22/2023 at 1136, lasting for about 4 minutes.  Additionally EEG showed evidence of epileptogenicity in left hemisphere, maximal left frontal region with increased risk of seizure recurrence.  Lastly there was  moderate to severe diffuse encephalopathy. Recommend long-term EEG for further monitoring of seizures Dr. Matthews was notified. Priyanka O Yadav   CT Head Wo Contrast Result Date: 01/22/2023 CLINICAL DATA:  Seizure disorder with clinical change. EXAM: CT HEAD WITHOUT CONTRAST TECHNIQUE: Contiguous axial images were obtained from the base of the skull through the vertex without intravenous contrast. RADIATION DOSE REDUCTION: This exam was performed according to the departmental dose-optimization program which includes automated exposure control, adjustment of the mA and/or kV according to patient size and/or use of iterative reconstruction technique. COMPARISON:  Three days ago FINDINGS: Brain: Subdural hematoma on the left, primarily high-density and increased from before. Maximal thickness is 1 cm on coronal reformats with mild cortical mass effect. The hematoma is seen both laterally and inferior to the left frontal lobe. There is a low-density hygroma appearance in the subdural space at the superior right frontal convexity since prior. A trace high-density subdural hematoma inferior to the right frontal lobe is unchanged at 2 mm thickness. No evidence of underlying infarct. No hydrocephalus or mass. Vascular: Atheromatous calcification. Skull: Unremarkable Sinuses/Orbits: Bilateral cataract resection IMPRESSION: 1. Increase in acute subdural hematoma on the left, measuring up to 1 cm. No shift of the atrophic brain. 2. Subdural hemorrhage inferior to the right frontal lobe is stable and trace when compared to prior. There is a degree of increased/new hygroma at the right vertex since prior. Electronically Signed   By: Dorn Roulette M.D.   On: 01/22/2023 08:13     Discharge Instructions: Discharge Instructions     Ambulatory referral to  Neurology   Complete by: As directed    An appointment is requested in approximately: 8-12 weeks   Call MD for:  difficulty breathing, headache or visual disturbances    Complete by: As directed    Call MD for:  persistant nausea and vomiting   Complete by: As directed    Call MD for:  temperature >100.4   Complete by: As directed    Diet - low sodium heart healthy   Complete by: As directed    Increase activity slowly   Complete by: As directed    No wound care   Complete by: As directed        Signed: Celestina Czar, MD 02/18/2023, 4:07 PM   Pager: [phone]

## 2023-02-18 NOTE — TOC Progression Note (Addendum)
 Transition of Care [facility]) - Progression Note    Patient Details  Name: Anne [name] MRN: [id number] Date of Birth: [birth year]  [facility] CM/SW Contact  Almarie CHRISTELLA [place]ie, [name] Phone Number: 02/18/2023, 11:04 AM  Clinical Narrative:   CSW noting per chart review that patient and husband are now considering home instead of continuing to pursue LTC. CSW coordinated with PT, will see patient to review for possible home discharge once husband arrives. CSW to follow.   UPDATE: CSW coordinated with PT, patient and spouse are in agreement to go home and PT suggesting home health and RW. Patient and spouse agreeable to [facility], initially not wanting RW but then agreeable. RW ordered through [employer], delivered to the room. HH referral given to [name]. Husband refusing [facility] transport. Meds being sent to [facility]. No other TOC needs at this time.   Expected Discharge Plan: Skilled Nursing Facility Barriers to Discharge: Insurance Authorization, SNF Pending Medicaid, SNF Pending bed offer, SNF Pending payor source - LOG  Expected Discharge Plan and Services In-house Referral: Clinical Social Work   Post Acute Care Choice: Skilled Nursing Facility Living arrangements for the past 2 months: Single Family Home                                       Social Determinants of Health (SDOH) Interventions SDOH Screenings   Food Insecurity: Patient Unable To Answer (01/22/2023)  Housing: Patient Unable To Answer (01/22/2023)  Transportation Needs: Patient Unable To Answer (01/22/2023)  Utilities: Patient Unable To Answer (01/22/2023)  Depression (PHQ2-9): High Risk (06/21/2022)  Financial Resource Strain: Low Risk  (09/08/2020)  Physical Activity: Unknown (10/29/2018)  Social Connections: Unknown (02/06/2023)  Stress: No Stress Concern Present (09/08/2020)  Tobacco Use: Medium Risk (01/22/2023)    Readmission Risk Interventions     No data to display

## 2023-02-18 NOTE — Progress Notes (Signed)
 Physical Therapy Treatment Patient Details Name: Anne [name] MRN: [id number] DOB: 15-Jun-1950 Today's Date: 02/18/2023   History of Present Illness Patient is a 73 year old female admitted 12/17 with new onset of seizures in the setting of underlying SDH. Fall in hospital with increased size of R SDH on CT. No change in neuro status and MD/family decided against any surgery. History of dementia, lung cancer, seizures, hypothyroid.    PT Comments  Pt resting in bed on arrival, eager for mobility and demonstrating steady progress. Pt able to progress gait distance slightly with grossly min A and RW for support. Pt with improved midline posture during gait with decreased R lateral lean, however pt continues to demonstrate R inattention needing assist to avoid obstacles in R environment. Pt requiring min A to power up to stand this session with cues for hand placement. Current plan remains appropriate to address deficits and maximize functional independence and decrease caregiver burden. Pt continues to benefit from skilled PT services to progress toward functional mobility goals.     If plan is discharge home, recommend the following: Supervision due to cognitive status;Help with stairs or ramp for entrance;Assist for transportation;Direct supervision/assist for medications management;Direct supervision/assist for financial management;Assistance with cooking/housework;A little help with walking and/or transfers;A little help with bathing/dressing/bathroom   Can travel by private vehicle     No  Equipment Recommendations  None recommended by PT    Recommendations for Other Services       Precautions / Restrictions Precautions Precautions: Fall Precaution Comments: Systolic blood pressure goal below 150; watch for orthostasis Restrictions Weight Bearing Restrictions Per Provider Order: No     Mobility  Bed Mobility Overal bed mobility: Needs Assistance Bed Mobility: Supine to Sit      Supine to sit: Contact guard Sit to supine: Contact guard assist   General bed mobility comments: CGA for safety    Transfers Overall transfer level: Needs assistance Equipment used: Rolling walker (2 wheels) Transfers: Sit to/from Stand Sit to Stand: Min assist, Contact guard assist           General transfer comment: cues for hand placement and light min A to boost to rise    Ambulation/Gait Ambulation/Gait assistance: Min assist Gait Distance (Feet): 15 Feet (+ 55') Assistive device: Rolling walker (2 wheels) Gait Pattern/deviations: Step-through pattern, Decreased step length - right, Decreased step length - left, Decreased stride length, Narrow base of support, Trunk flexed Gait velocity: decreased     General Gait Details: min A to manage RW as pt drifitng R due to inattention and bumping obstacles in R environment, min A also needed to maintain balance as pt with increased R lateral lean and decreased RLE clearance with fatigue   Stairs             Wheelchair Mobility     Tilt Bed    Modified Rankin (Stroke Patients Only)       Balance Overall balance assessment: Needs assistance, History of Falls Sitting-balance support: Feet supported, No upper extremity supported Sitting balance-Leahy Scale: Fair   Postural control: Right lateral lean, Posterior lean Standing balance support: Bilateral upper extremity supported, During functional activity Standing balance-Leahy Scale: Poor Standing balance comment: Mild instability but no overt LOB.                            Cognition Arousal: Alert Behavior During Therapy: Flat affect Overall Cognitive Status: Impaired/Different from baseline Area of  Impairment: Attention, Memory, Following commands, Safety/judgement, Awareness, Problem solving                 Orientation Level: Disoriented to, Place, Time, Situation Current Attention Level: Focused Memory: Decreased short-term  memory Following Commands: Follows one step commands inconsistently Safety/Judgement: Decreased awareness of safety, Decreased awareness of deficits Awareness: Intellectual Problem Solving: Slow processing, Decreased initiation, Difficulty sequencing, Requires verbal cues, Requires tactile cues General Comments: R inattention, slowed command following. benefits from simple 1 step cues. pt more communicative throughout session        Exercises      General Comments General comments (skin integrity, edema, etc.): VSS, pt spouse present and supportive      Pertinent Vitals/Pain Pain Assessment Pain Assessment: Faces Faces Pain Scale: No hurt Pain Intervention(s): Monitored during session    Home Living                          Prior Function            PT Goals (current goals can now be found in the care plan section) Acute Rehab PT Goals Patient Stated Goal: unable to state PT Goal Formulation: With patient Time For Goal Achievement: 02/07/23 Progress towards PT goals: Progressing toward goals    Frequency    Min 1X/week      PT Plan      Co-evaluation              AM-PAC PT 6 Clicks Mobility   Outcome Measure  Help needed turning from your back to your side while in a flat bed without using bedrails?: A Little Help needed moving from lying on your back to sitting on the side of a flat bed without using bedrails?: A Little Help needed moving to and from a bed to a chair (including a wheelchair)?: A Little Help needed standing up from a chair using your arms (e.g., wheelchair or bedside chair)?: A Little Help needed to walk in hospital room?: A Little Help needed climbing 3-5 steps with a railing? : Total 6 Click Score: 16    End of Session Equipment Utilized During Treatment: Gait belt Activity Tolerance: Patient tolerated treatment well Patient left: with call bell/phone within reach;in bed;with bed alarm set;with family/visitor  present Nurse Communication: Mobility status PT Visit Diagnosis: Other abnormalities of gait and mobility (R26.89);Difficulty in walking, not elsewhere classified (R26.2);Other symptoms and signs involving the nervous system (R29.898);History of falling (Z91.81);Unsteadiness on feet (R26.81);Muscle weakness (generalized) (M62.81)     Time: 8747-8681 PT Time Calculation (min) (ACUTE ONLY): 26 min  Charges:    $Gait Training: 8-22 mins $Therapeutic Activity: 8-22 mins PT General Charges $$ ACUTE PT VISIT: 1 Visit                     Nevah Dalal R. PTA Acute Rehabilitation Services Office: [phone]   Therisa CHRISTELLA Boor 02/18/2023, 4:39 PM

## 2023-02-19 ENCOUNTER — Telehealth: Payer: Self-pay

## 2023-02-19 NOTE — Transitions of Care (Post Inpatient/ED Visit) (Signed)
   02/19/2023  Name: Anne [name] MRN: [id number] DOB: [birth year]  Today's TOC FU Call Status: Today's TOC FU Call Status:: Unsuccessful Call (1st Attempt) Unsuccessful Call (1st Attempt) Date: 02/19/23  Attempted to reach the patient regarding the most recent Inpatient/ED visit.  Follow Up Plan: Additional outreach attempts will be made to reach the patient to complete the Transitions of Care (Post Inpatient/ED visit) call.   Medford Balboa, RN Medical Illustrator [facility] [phone]

## 2023-02-20 ENCOUNTER — Telehealth: Payer: Self-pay

## 2023-02-20 DIAGNOSIS — F03A18 Unspecified dementia, mild, with other behavioral disturbance: Secondary | ICD-10-CM

## 2023-02-20 NOTE — Patient Outreach (Signed)
 Care Management  Transitions of Care Program Transitions of Care Post-discharge Initial Visit   02/20/2023 Name: Anne [name] MRN: [id number] DOB: 1950-09-03  Subjective: Anne [name] is a 73 y.o. year old female who is a primary care patient of Lovetta Rucks, Robbi Childs, MD. The Care Management team Engaged with patient Engaged with patient by telephone to assess and address transitions of care needs.   Consent to Services:  Patient was given information about care management services, agreed to services, and gave verbal consent to participate.   Assessment:  Date of Discharge: 02/18/23 Discharge Facility: [facility] Type of Discharge: Inpatient Admission Primary Inpatient Discharge Diagnosis:: Subdural Hematoma  SDOH Interventions    Flowsheet Row Telephone from 02/20/2023 in [place] POPULATION HEALTH DEPARTMENT Office Visit from 08/22/2022 in [facility] Coordination from 04/20/2022 in [facility] Community Care Coordination Office Visit from 03/26/2022 in [facility] [facility] [employer] Visit from 03/19/2022 in [facility] [employer] Visit from 11/23/2021 in [facility] at [employer] [facility]  SDOH Interventions        Food Insecurity Interventions Intervention Not Indicated -- Intervention Not Indicated -- -- --  Housing Interventions Intervention Not Indicated -- Intervention Not Indicated -- -- --  Transportation Interventions Intervention Not Indicated -- Intervention Not Indicated -- -- --  Utilities Interventions Intervention Not Indicated -- -- -- -- --  Depression Interventions/Treatment  -- Currently on Treatment -- Currently on Treatment, Referral to Psychiatry Inpatient TOC Currently on Treatment  Social Connections Interventions Patient Declined -- -- -- -- --        Goals Addressed             This Visit's Progress     Patient Stated       Current Barriers:  Chronic Disease Management support and education needs related to Dementia   RNCM Clinical Goal(s):  Patient will work with the Care Management team over the next 30 days to address Transition of Care Barriers: Medication access Medication Management Diet/Nutrition/Food Resources Support at home Provider appointments Home Health services Equipment/DME Functional/Safety verbalize basic understanding of  Dementia disease process and self health management plan as evidenced by No admissions to the hospital in the next 30 days  through collaboration with RN Care manager, provider, and care team.   Interventions: Evaluation of current treatment plan related to  self management and patient's adherence to plan as established by provider   Falls Interventions:  (Status:  New goal.) Short Term Goal Reviewed medications and discussed potential side effects of medications such as dizziness and frequent urination Advised patient of importance of notifying provider of falls Assessed for falls since last encounter Advised patient to discuss Frequent falls with provider   Dementia:  (Status:  New goal.)  Short Term Goal Evaluation of current treatment plan related to misuse of: Dementia with agitation, Dementia with mood disturbances, and Dementia with wondering Made referral to Social Work, Reviewed medications including Nayzilam , Memantine , Consideration of in-home help encouraged , Discussed importance of attendance to all provider appointments, and Advised to contact provider for new or worsening symptoms  Patient Goals/Self-Care Activities: Participate in Transition of Care Program/Attend [facility] scheduled calls Notify RN Care Manager of TOC call rescheduling needs Take all medications as prescribed Attend all scheduled provider appointments Call pharmacy for medication refills 3-7 days in advance of running out of medications Call provider office for new  concerns or  questions   Follow Up Plan:  Friday January 17th at 1:30pm       Interventions Today    Flowsheet Row Most Recent Value  Chronic Disease   Chronic disease during today's visit Other  [Dementia]  General Interventions   General Interventions Discussed/Reviewed General Interventions Discussed, Level of Care  Level of Care Personal Care Services  Education Interventions   Education Provided Provided Education  Provided Verbal Education On Nutrition, When to see the doctor, Applications, Medication, Insurance Plans  [Medicaid]  Nutrition Interventions   Nutrition Discussed/Reviewed Nutrition Discussed, Supplemental nutrition, Increasing proteins  Pharmacy Interventions   Pharmacy Dicussed/Reviewed Medications and their functions  Safety Interventions   Safety Discussed/Reviewed Fall Risk  Advanced Directive Interventions   Advanced Directives Discussed/Reviewed Advanced Care Planning  [Patient is a DNR]       TOC Outreach today for hospital discharge on 02/18/2023. The patient has dementia and sustained two falls and struck her head both time. One fall required stitches and the other fall she had LOC. She was in the hospital for several weeks and her insurance denied a rehab stay because she was thought to not be participating. She has been home for two days. The family has not been contacted by [facility]. RNCM placed a call to Amy for an update. The patient's spouse answers the questions due to her cognitive level. She is sleeping most of the time. He was able to secure a hospital bed due to "scrapping" but needs help putting it together and he needs a mattress. He also states that he is alone with no help in caring for her. Discussed PCS services and Medicaid. Referral made to SW for assistance. PCP follow up is 02/25/23. RNCM follow up in two days for an update.   Please refer to Care Plan for goals and interventions.  Patient educated on red flags signs/symptoms to  watch for and was encouraged to report any of these identified, any new symptoms, changes in baseline or medication regimen, change in health status / well-being, or safety concerns to PCP and / or the [facility] Case Management team.   Gareld June, RN RN Care Manager [facility] [phone]

## 2023-02-21 ENCOUNTER — Other Ambulatory Visit: Payer: Self-pay

## 2023-02-21 ENCOUNTER — Other Ambulatory Visit (HOSPITAL_COMMUNITY): Payer: Self-pay

## 2023-02-21 ENCOUNTER — Telehealth: Payer: Self-pay

## 2023-02-21 ENCOUNTER — Telehealth: Payer: Self-pay | Admitting: *Deleted

## 2023-02-21 NOTE — Telephone Encounter (Signed)
Copied from CRM [id number]. Topic: Clinical - Home Health Verbal Orders >> Feb 21, 2023 11:28 AM Almira Coaster wrote: Caller/Agency: [facility] Callback Number: [phone] Service Requested: Skilled Nursing  Frequency: 1 time a week for 3 weeks.  Any new concerns about the patient? Yes, she is hard to get along with after being discharged from the hospital. She is not seeing eye to eye with spouse who is attempting care for the patient and she just won't agree.

## 2023-02-21 NOTE — Telephone Encounter (Signed)
Copied from CRM [id number]. Topic: Clinical - Medication Question >> Feb 21, 2023 11:25 AM Almira Coaster wrote: Reason for CRM: Norwood Levo from [place] home health is calling regarding Midazolam (NAYZILAM) 5 MG/0.1ML SOLN prescription. The pharmacy is asking for more detailed instructions prior to filling the script due to insurance coverage their phone number is [phone].

## 2023-02-21 NOTE — Telephone Encounter (Signed)
Copied from CRM [id number]. Topic: Clinical - Home Health Verbal Orders >> Feb 21, 2023 12:28 PM Theodis Sato wrote: Reason for CRM: Anne [name] (Physical Therapist) from [place] home health is requesting verbal orders for physical therapy for 1 week 1 - 3 week 1- 2 week 3 please call #[phone]   Anne [name] states she is unclear why insurance would deny previous home health request as patient husband is in over his head and states patient needs a wheel chair order because she is very high fall risk.

## 2023-02-21 NOTE — Progress Notes (Signed)
Complex Care Management Note  Care Guide Note 02/21/2023 Name: Anne [name] MRN: [id number] DOB: 07-Sep-1950  Anne [name] is a 73 y.o. year old female who sees Birdie Sons, Yehuda Mao, MD for primary care. I reached out to Anne [name] by phone today to offer complex care management services.  Anne [name] was given information about Complex Care Management services today including:   The Complex Care Management services include support from the care team which includes your Nurse Coordinator, Clinical Social Worker, or Pharmacist.  The Complex Care Management team is here to help remove barriers to the health concerns and goals most important to you. Complex Care Management services are voluntary, and the patient may decline or stop services at any time by request to their care team member.   Complex Care Management Consent Status: Patient agreed to services and verbal consent obtained.   Follow up plan:  Telephone appointment with complex care management team member scheduled for:  02/22/2023  Encounter Outcome:  Patient Scheduled  Burman Nieves, CMA, Care Guide [facility]  [facility], [facility] Guide Direct Dial: [phone]  Fax: [phone] Website: [email or web address]

## 2023-02-22 ENCOUNTER — Other Ambulatory Visit: Payer: Self-pay

## 2023-02-22 ENCOUNTER — Ambulatory Visit: Payer: Self-pay

## 2023-02-22 ENCOUNTER — Telehealth: Payer: Self-pay

## 2023-02-22 DIAGNOSIS — R531 Weakness: Secondary | ICD-10-CM

## 2023-02-22 NOTE — Patient Outreach (Signed)
Care Management  Transitions of Care Program Transitions of Care Post-discharge Visit 2    02/22/2023 Name: Anne [name] MRN: [id number] DOB: 10-Jul-1950  Subjective: Anne [name] is a 73 y.o. year old female who is a primary care patient of Birdie Sons, Yehuda Mao, MD. The Care Management team Engaged with patient Engaged with patient by telephone to assess and address transitions of care needs.   Consent to Services:  Patient was given information about care management services, agreed to services, and gave verbal consent to participate.   Assessment: Follow up phone call made today for update on the patient's status. The spouse talks for the patient because she has dementia. He states she is just getting up and it is 1:30pm. He woke her up at 6am to give her medication and then she went back to sleep. He does wake her up to go to the potty chair and then he will try to get her to eat and take the rest of her medication. HHPT and SN have come to the house. He states he will not put the hospital bed together and she is sleeping in a king size bed. Discussed Fall prevention. The SW will call today to discuss Medicaid and PCS services. She has a hospital follow up appointment on 02/25/23. RNCM to follow up with patient's husband next week.  SDOH Interventions    Flowsheet Row Patient Outreach from 02/22/2023 in [place] POPULATION HEALTH DEPARTMENT Telephone from 02/20/2023 in [place] POPULATION HEALTH DEPARTMENT Office Visit from 08/22/2022 in [facility] [place] [facility] Coordination from 04/20/2022 in [facility] Community Care Coordination Office Visit from 03/26/2022 in [facility] [place] [facility] [employer] Visit from 03/19/2022 in [facility] [place] [facility] [employer]  SDOH Interventions        Food Insecurity Interventions -- Intervention Not Indicated -- Intervention Not Indicated -- --  Housing Interventions --  Intervention Not Indicated -- Intervention Not Indicated -- --  Transportation Interventions -- Intervention Not Indicated -- Intervention Not Indicated -- --  Utilities Interventions -- Intervention Not Indicated -- -- -- --  Alcohol Usage Interventions Intervention Not Indicated (Score <7) -- -- -- -- --  Depression Interventions/Treatment  -- -- Currently on Treatment -- Currently on Treatment, Referral to Psychiatry Inpatient TOC  Financial Strain Interventions Intervention Not Indicated -- -- -- -- --  Physical Activity Interventions Intervention Not Indicated -- -- -- -- --  Stress Interventions Intervention Not Indicated -- -- -- -- --  Social Connections Interventions -- Patient Declined -- -- -- --  Health Literacy Interventions Intervention Not Indicated -- -- -- -- --        Goals Addressed             This Visit's Progress    TOC Care Plan       Current Barriers:  Chronic Disease Management support and education needs related to Dementia   RNCM Clinical Goal(s):  Patient will work with the Care Management team over the next 30 days to address Transition of Care Barriers: Medication access Medication Management Diet/Nutrition/Food Resources Support at home Provider appointments Home Health services Equipment/DME Functional/Safety verbalize basic understanding of  Dementia disease process and self health management plan as evidenced by No admissions to the hospital in the next 30 days  through collaboration with RN Care manager, provider, and care team.   Interventions: Evaluation of current treatment plan related to  self management and patient's adherence to  plan as established by provider   Falls Interventions:  (Status:  New goal.) Short Term Goal Reviewed medications and discussed potential side effects of medications such as dizziness and frequent urination Advised patient of importance of notifying provider of falls Assessed for falls since last  encounter Advised patient to discuss Frequent falls with provider   Dementia:  (Status:  New goal.)  Short Term Goal Evaluation of current treatment plan related to misuse of: Dementia with agitation, Dementia with mood disturbances, and Dementia with wondering Made referral to Social Work, Reviewed medications including Nayzilam, Memantine, Consideration of in-home help encouraged , Discussed importance of attendance to all provider appointments, and Advised to contact provider for new or worsening symptoms  Patient Goals/Self-Care Activities: Participate in Transition of Care Program/Attend TOC scheduled calls Notify RN Care Manager of TOC call rescheduling needs Take all medications as prescribed Attend all scheduled provider appointments Call pharmacy for medication refills 3-7 days in advance of running out of medications Call provider office for new concerns or questions   Follow Up Plan:  02/27/23/ at 1:30pm       Please refer to Care Plan for goals and interventions.  Patient educated on red flags signs/symptoms to watch for and was encouraged to report any of these identified, any new symptoms, changes in baseline or medication regimen, change in health status / well-being, or safety concerns to PCP and / or the [facility] Case Management team.   The patient has been provided with contact information for the care management team and has been advised to call with any health-related questions or concerns. The patient verbalized understanding with current POC. The patient is directed to their insurance card regarding availability of benefits coverage.   Deidre Ala, RN Medical illustrator [facility]Population [facility] [phone]

## 2023-02-22 NOTE — Telephone Encounter (Signed)
Copied from CRM [id number]. Topic: Clinical - Home Health Verbal Orders >> Feb 22, 2023 12:14 PM Adele Barthel wrote: Caller/Agency: [facility], [facility] Number: [phone] Service Requested: Physical Therapy Frequency: 1 week 1 - 3 week 1- 2 week 3    Anne [name] states she is unclear why insurance would deny previous home health request as patient husband is in over his head and states patient needs a wheel chair order because she is very high fall risk. Can leave detailed message at number listed above. Needs order by Monday 02/25/2023 for appointment  Any new concerns about the patient? No

## 2023-02-22 NOTE — Telephone Encounter (Signed)
Please give verbal orders for home health. Can home health provide the wheel chair with a verbal order? If not we can give the patients husband the written order and he can go to a DME store.

## 2023-02-22 NOTE — Telephone Encounter (Signed)
Verbal orders can be given. 

## 2023-02-22 NOTE — Patient Outreach (Signed)
  Care Coordination   Initial Visit Note   02/22/2023 Name: [name] MRN: [id number] DOB: 01/10/1951  Anne [name] is a 73 y.o. year old female who sees Birdie Sons, Yehuda Mao, MD for primary care. I spoke with  Tiffany Kocher by phone today.  What matters to the patients health and wellness today?  Patients husband reports need for Medicaid and In Home services.  Patient husband is not pleased with the way the system is set up.  SW is unable to complete assessment as husband ends the call.    Goals Addressed             This Visit's Progress    In home care       Interventions Today    Flowsheet Row Most Recent Value  General Interventions   General Interventions Discussed/Reviewed General Interventions Discussed, General Interventions Reviewed, Community Resources  [Pt is asleep and husband reports Medicaid denied based on income.SW encouraged to appeal.SW explain Medicare insurance does not cover personal inhome care.SW offers list of private pay services. Husband ends to call by hanging up.]              SDOH assessments and interventions completed:  No     Care Coordination Interventions:  Yes, provided   Follow up plan: No further intervention required.   Encounter Outcome:  Patient Visit Completed

## 2023-02-22 NOTE — Telephone Encounter (Signed)
Noted. Verbal orders can be given.

## 2023-02-22 NOTE — Telephone Encounter (Signed)
Per neurology documentation from patient's recent hospitalization the midazolam instructions should read Intranasal midazolam 5 mg for seizure lasting more than 2 minutes. If seizure persists, can repeat dose after 10 minutes. Do not use more than 2 doses.

## 2023-02-22 NOTE — Patient Instructions (Signed)
Visit Information  Thank you for taking time to visit with me today. Please don't hesitate to contact me if I can be of assistance to you before our next scheduled telephone appointment.   Following is a copy of your care plan:   Goals Addressed             This Visit's Progress    TOC Care Plan       Current Barriers:  Chronic Disease Management support and education needs related to Dementia   RNCM Clinical Goal(s):  Patient will work with the Care Management team over the next 30 days to address Transition of Care Barriers: Medication access Medication Management Diet/Nutrition/Food Resources Support at home Provider appointments Home Health services Equipment/DME Functional/Safety verbalize basic understanding of  Dementia disease process and self health management plan as evidenced by No admissions to the hospital in the next 30 days  through collaboration with RN Care manager, provider, and care team.   Interventions: Evaluation of current treatment plan related to  self management and patient's adherence to plan as established by provider   Falls Interventions:  (Status:  New goal.) Short Term Goal Reviewed medications and discussed potential side effects of medications such as dizziness and frequent urination Advised patient of importance of notifying provider of falls Assessed for falls since last encounter Advised patient to discuss Frequent falls with provider   Dementia:  (Status:  New goal.)  Short Term Goal Evaluation of current treatment plan related to misuse of: Dementia with agitation, Dementia with mood disturbances, and Dementia with wondering Made referral to Social Work, Reviewed medications including Nayzilam, Memantine, Consideration of in-home help encouraged , Discussed importance of attendance to all provider appointments, and Advised to contact provider for new or worsening symptoms  Patient Goals/Self-Care Activities: Participate in Transition  of Care Program/Attend TOC scheduled calls Notify RN Care Manager of TOC call rescheduling needs Take all medications as prescribed Attend all scheduled provider appointments Call pharmacy for medication refills 3-7 days in advance of running out of medications Call provider office for new concerns or questions   Follow Up Plan:  02/27/23/ at 1:30pm        The patient verbalized understanding of instructions, educational materials, and care plan provided today and agreed to receive a mailed copy of patient instructions, educational materials, and care plan.   The patient has been provided with contact information for the care management team and has been advised to call with any health related questions or concerns.  Next PCP appointment scheduled for: 02/25/2023  Please call the care guide team at [phone] if you need to cancel or reschedule your appointment.   Please call the Suicide and Crisis Lifeline: 988 call the [place] National Suicide Prevention Lifeline: 810-111-0337 or TTY: 609-107-2164 TTY (843)063-3069) to talk to a trained counselor if you are experiencing a Mental Health or Behavioral Health Crisis or need someone to talk to.  Deidre Ala, RN Medical illustrator [facility]-Population [facility] [phone]

## 2023-02-25 ENCOUNTER — Other Ambulatory Visit: Payer: Self-pay

## 2023-02-25 ENCOUNTER — Telehealth: Payer: Self-pay

## 2023-02-25 ENCOUNTER — Ambulatory Visit (INDEPENDENT_AMBULATORY_CARE_PROVIDER_SITE_OTHER): Payer: PPO | Admitting: Family Medicine

## 2023-02-25 ENCOUNTER — Encounter: Payer: Self-pay | Admitting: Family Medicine

## 2023-02-25 VITALS — BP 128/64 | HR 77 | Ht 68.0 in | Wt 142.8 lb

## 2023-02-25 DIAGNOSIS — I6201 Nontraumatic acute subdural hemorrhage: Secondary | ICD-10-CM | POA: Diagnosis not present

## 2023-02-25 DIAGNOSIS — R569 Unspecified convulsions: Secondary | ICD-10-CM | POA: Diagnosis not present

## 2023-02-25 DIAGNOSIS — F331 Major depressive disorder, recurrent, moderate: Secondary | ICD-10-CM | POA: Diagnosis not present

## 2023-02-25 DIAGNOSIS — Z08 Encounter for follow-up examination after completed treatment for malignant neoplasm: Secondary | ICD-10-CM

## 2023-02-25 DIAGNOSIS — I6203 Nontraumatic chronic subdural hemorrhage: Secondary | ICD-10-CM | POA: Diagnosis not present

## 2023-02-25 DIAGNOSIS — Z742 Need for assistance at home and no other household member able to render care: Secondary | ICD-10-CM

## 2023-02-25 DIAGNOSIS — D696 Thrombocytopenia, unspecified: Secondary | ICD-10-CM

## 2023-02-25 DIAGNOSIS — S51819A Laceration without foreign body of unspecified forearm, initial encounter: Secondary | ICD-10-CM | POA: Diagnosis not present

## 2023-02-25 DIAGNOSIS — E063 Autoimmune thyroiditis: Secondary | ICD-10-CM

## 2023-02-25 DIAGNOSIS — S065XAA Traumatic subdural hemorrhage with loss of consciousness status unknown, initial encounter: Secondary | ICD-10-CM

## 2023-02-25 DIAGNOSIS — E039 Hypothyroidism, unspecified: Secondary | ICD-10-CM

## 2023-02-25 DIAGNOSIS — C3411 Malignant neoplasm of upper lobe, right bronchus or lung: Secondary | ICD-10-CM

## 2023-02-25 MED ORDER — SERTRALINE HCL 100 MG PO TABS: 150.0000 mg | ORAL_TABLET | Freq: Every day | ORAL | 0 refills | Status: DC

## 2023-02-25 NOTE — Telephone Encounter (Signed)
I have sent a high priority message to scheduling.

## 2023-02-25 NOTE — Patient Instructions (Addendum)
Nice to see you.  Please make sure you keep your appointment with neurology.  I will have someone reach out to the social worker with home health to get their input on options for the home.  I will also check to see if neurosurgery wants to see you for follow-up.

## 2023-02-25 NOTE — Telephone Encounter (Signed)
When they call back regarding this can you also see if we can get the social workers input on what options there are for the patient for help at home?

## 2023-02-25 NOTE — Telephone Encounter (Signed)
Duplicate message-please see previous telephone encounter.

## 2023-02-25 NOTE — Telephone Encounter (Signed)
LMTCB

## 2023-02-25 NOTE — Telephone Encounter (Signed)
Per discussion with Dr Katrinka Blazing, he would like her to have a repeat head CT within the next week (2 weeks, max) and see her in clinic ASAP after. CT has been ordered. Please schedule a return appt with Dr Katrinka Blazing right after the CT is done. He said it is OK to schedule for same day or soon after, even if we do not have the radiology report.

## 2023-02-25 NOTE — Assessment & Plan Note (Signed)
Chronic issue.  Patient will see neurology as planned.  Message sent to her neurosurgeon to see if they want to see her as well.

## 2023-02-25 NOTE — Assessment & Plan Note (Signed)
Chronic issue.  Patient will continue Keppra 1000 mg twice daily and she will see neurology as planned.  Discussed that the midazolam is to use only as needed if she has a seizure.  Advised that our office called to leave a message regarding the midazolam instructions for the pharmacy.  We will also work with social work to see what services the patient can access at home.

## 2023-02-25 NOTE — Assessment & Plan Note (Signed)
Area on right forearm appears to be a skin tear.  Discussed there is no role for sutures or closure.  Discussed that this will progressively heal on its own.  The area was cleansed with saline.  Advised on applying Vaseline and keeping the area cleansed with soap and water.  Discussed covering with a bandage.  Advised on monitoring for signs of infection and seeking medical attention immediately if she develops infection.

## 2023-02-25 NOTE — Telephone Encounter (Signed)
-----   Message from Lovenia Kim sent at 02/25/2023  4:25 PM EST ----- Regarding: Follow-up for large subdural, ideally will have a repeat head CT with her clinic visit. Thank you for the follow-up, I be happy to see her.  When she was hospitalized her hematoma was to solid and would have needed a large craniotomy.  At this point if she feels like her symptoms are getting worse or she is having harder control with her seizures we could evacuated with bur holes which is a much smaller procedure and sometimes does not even need full anesthesia.  I am CCing our team here to help get her into clinic so we can discuss this. ----- Message ----- From: Glori Luis, MD Sent: 02/25/2023   2:48 PM EST To: Lovenia Kim, MD  Hi, I saw this patient for follow-up from her recent prolonged hospitalization for a subdural hematoma and seizures. I wanted to see if you needed to see her for follow-up. She is seeing neurology at the end of the month. Thanks.   Minerva Areola

## 2023-02-25 NOTE — Progress Notes (Signed)
Anne Alar, MD Phone: [phone]  Anne [name] is a 73 y.o. female who presents today for follow-up.  Subdural hematoma: Patient recently hospitalized for a prolonged period of time after having a fall and a head injury leading to subdural hematoma and seizures.  Patient was started on Keppra and provided with midazolam prescription to use as needed for seizure activity.  Her husband reports she has not had any seizures since coming home.  They have neurology follow-up scheduled.  It does not appear that they have neurosurgery follow-up scheduled.  Patient's husband reports she has had right sided weakness that seems to have been going on for some time now.  Notes the physical therapy has noted this.  Per review of neurology notes it appears she had 4/5 strength bilaterally during the early portion of her hospitalization.  Husband reports she had a fall yesterday and did not hit her head though did hit her right arm and has a skin tear.  He reports they have talked to social work at home on 1 occasion.  She was unable to get approved for Medicaid or SNF placement.  Most recent CT head with minimally increased size of mixed attenuation left holohemispheric subdural hematoma that was measuring 18 mm compared to 17 mm previously.  Slightly increased rightward midline shift was measuring 9 mm and previously 8 mm.  Slight left uncal herniation noted as well.  Social History   Tobacco Use  Smoking Status Former   Current packs/day: 0.00   Average packs/day: 2.0 packs/day for 50.0 years (100.0 ttl pk-yrs)   Types: Cigarettes, E-cigarettes   Start date: 02/06/1962   Quit date: 02/07/2012   Years since quitting: 11.0  Smokeless Tobacco Never    Current Outpatient Medications on File Prior to Visit  Medication Sig Dispense Refill   acetaminophen (TYLENOL) 500 MG tablet Take 1,000 mg by mouth every 6 (six) hours as needed for moderate pain (pain score 4-6).     [Paused] amLODipine (NORVASC) 5 MG  tablet Take 1 tablet by mouth once daily 90 tablet 0   levETIRAcetam (KEPPRA) 1000 MG tablet Take 1 tablet (1,000 mg total) by mouth 2 (two) times daily. 60 tablet 2   levothyroxine (SYNTHROID) 112 MCG tablet Take 1 tablet (112 mcg total) by mouth daily at 6 (six) AM. 30 tablet 0   memantine (NAMENDA) 10 MG tablet Take 1 tablet (10 mg total) by mouth 2 (two) times daily. 60 tablet 11   Midazolam (NAYZILAM) 5 MG/0.1ML SOLN Place 5 mg into the nose as needed. 2 each 2   Multiple Vitamin (MULTIVITAMIN) tablet Take 1 tablet by mouth daily.     rivastigmine (EXELON) 3 MG capsule Take 1 capsule (3 mg total) by mouth 2 (two) times daily. 60 capsule 11   rosuvastatin (CRESTOR) 20 MG tablet Take 1 tablet (20 mg total) by mouth daily. 90 tablet 3   Current Facility-Administered Medications on File Prior to Visit  Medication Dose Route Frequency Provider Last Rate Last Admin   denosumab (XGEVA) injection 120 mg  120 mg Subcutaneous Q30 days Creig Hines, MD   120 mg at 03/05/19 1525   heparin lock flush 100 UNIT/ML injection              ROS see history of present illness  Objective  Physical Exam Vitals:   02/25/23 1402  BP: 128/64  Pulse: 77  SpO2: 94%    BP Readings from Last 3 Encounters:  02/25/23 128/64  02/18/23 127/67  01/19/23 (!) 151/83   Wt Readings from Last 3 Encounters:  02/25/23 142 lb 12.8 oz (64.8 kg)  01/23/23 162 lb 14.7 oz (73.9 kg)  01/19/23 163 lb (73.9 kg)    Physical Exam Constitutional:      General: She is not in acute distress.    Appearance: She is not diaphoretic.  Cardiovascular:     Rate and Rhythm: Normal rate and regular rhythm.     Heart sounds: Normal heart sounds.  Pulmonary:     Effort: Pulmonary effort is normal.  Skin:    General: Skin is warm and dry.  Neurological:     Mental Status: She is alert.     Comments: 4/5 strength right bicep, tricep, grip, quad, hamstring, plantarflexion, and dorsiflexion, 5/5 strength in left biceps,  triceps, grip, quads, hamstrings, plantar and dorsiflexion, sensation to light touch intact in bilateral UE and LE      Assessment/Plan: Please see individual problem list.  Acute on chronic intracranial subdural hematoma (HCC) Assessment & Plan: Chronic issue.  Patient will see neurology as planned.  Message sent to her neurosurgeon to see if they want to see her as well.   Moderate episode of recurrent major depressive disorder (HCC) -     Sertraline HCl; Take 1.5 tablets (150 mg total) by mouth daily.  Dispense: 135 tablet; Refill: 0  Thrombocytopenia (HCC) -     CBC with Differential/Platelet  Hypothyroidism, unspecified type -     TSH  Seizure (HCC) Assessment & Plan: Chronic issue.  Patient will continue Keppra 1000 mg twice daily and she will see neurology as planned.  Discussed that the midazolam is to use only as needed if she has a seizure.  Advised that our office called to leave a message regarding the midazolam instructions for the pharmacy.  We will also work with social work to see what services the patient can access at home.   Skin tear of forearm without complication, initial encounter Assessment & Plan: Area on right forearm appears to be a skin tear.  Discussed there is no role for sutures or closure.  Discussed that this will progressively heal on its own.  The area was cleansed with saline.  Advised on applying Vaseline and keeping the area cleansed with soap and water.  Discussed covering with a bandage.  Advised on monitoring for signs of infection and seeking medical attention immediately if she develops infection.     Return in about 1 month (around 03/28/2023) for transfer of care if possible with Clent Ridges, if needed can schedule with Haddy Mullinax.   Anne Alar, MD [facility]

## 2023-02-26 ENCOUNTER — Telehealth: Payer: Self-pay

## 2023-02-26 ENCOUNTER — Telehealth: Payer: Self-pay | Admitting: Family Medicine

## 2023-02-26 DIAGNOSIS — R531 Weakness: Secondary | ICD-10-CM | POA: Insufficient documentation

## 2023-02-26 LAB — CBC WITH DIFFERENTIAL/PLATELET
Basophils Absolute: 0.1 10*3/uL (ref 0.0–0.1)
Basophils Relative: 1.2 % (ref 0.0–3.0)
Eosinophils Absolute: 0 10*3/uL (ref 0.0–0.7)
Eosinophils Relative: 0.6 % (ref 0.0–5.0)
HCT: 41.7 % (ref 36.0–46.0)
Hemoglobin: 13.5 g/dL (ref 12.0–15.0)
Lymphocytes Relative: 17.6 % (ref 12.0–46.0)
Lymphs Abs: 1.4 10*3/uL (ref 0.7–4.0)
MCHC: 32.2 g/dL (ref 30.0–36.0)
MCV: 94.8 fL (ref 78.0–100.0)
Monocytes Absolute: 0.4 10*3/uL (ref 0.1–1.0)
Monocytes Relative: 5.1 % (ref 3.0–12.0)
Neutro Abs: 6.1 10*3/uL (ref 1.4–7.7)
Neutrophils Relative %: 75.5 % (ref 43.0–77.0)
Platelets: 235 10*3/uL (ref 150.0–400.0)
RBC: 4.4 Mil/uL (ref 3.87–5.11)
RDW: 16.7 % — ABNORMAL HIGH (ref 11.5–15.5)
WBC: 8.1 10*3/uL (ref 4.0–10.5)

## 2023-02-26 LAB — TSH: TSH: 11.31 u[IU]/mL — ABNORMAL HIGH (ref 0.35–5.50)

## 2023-02-26 NOTE — Telephone Encounter (Signed)
Copied from CRM [id number]. Topic: Clinical - Home Health Verbal Orders >> Feb 26, 2023  8:22 AM Benetta Spar A wrote: Caller/Agency: Irving Burton [facility] Irving Burton has called 3x and would like for the order to be left on her voicemail for patient  Callback Number: [phone]  Service Requested: N/A Frequency: N/A Any new concerns about the patient? No

## 2023-02-26 NOTE — Telephone Encounter (Signed)
No answer

## 2023-02-26 NOTE — Telephone Encounter (Signed)
Verbal given, see other message

## 2023-02-26 NOTE — Telephone Encounter (Signed)
Please let the patient's husband know that neurosurgery should be contacting them to schedule a follow-up for Mrs. Anne [name].  Thanks.

## 2023-02-26 NOTE — Telephone Encounter (Signed)
-----   Message from Lovenia Kim sent at 02/25/2023  4:25 PM EST ----- Regarding: Follow-up for large subdural, ideally will have a repeat head CT with her clinic visit. Thank you for the follow-up, I be happy to see her.  When she was hospitalized her hematoma was to solid and would have needed a large craniotomy.  At this point if she feels like her symptoms are getting worse or she is having harder control with her seizures we could evacuated with bur holes which is a much smaller procedure and sometimes does not even need full anesthesia.  I am CCing our team here to help get her into clinic so we can discuss this. ----- Message ----- From: Glori Luis, MD Sent: 02/25/2023   2:48 PM EST To: Lovenia Kim, MD  Hi, I saw this patient for follow-up from her recent prolonged hospitalization for a subdural hematoma and seizures. I wanted to see if you needed to see her for follow-up. She is seeing neurology at the end of the month. Thanks.   Minerva Areola

## 2023-02-26 NOTE — Telephone Encounter (Signed)
03/04/2023 830am CT scan then come over to see Dr.Smtih. Anne [name] confirmed both appts.

## 2023-02-26 NOTE — Telephone Encounter (Signed)
Attempted to call the Patient's husband [name] but no answer or voicemail

## 2023-02-26 NOTE — Telephone Encounter (Signed)
Noted.  Can you contact the patient's husband and see if she is having issues bathing herself, getting dressed on her own, going to the bathroom on her own, feeding herself, and grooming herself?  I need to know these things so I can order the wheelchair for her.

## 2023-02-26 NOTE — Telephone Encounter (Signed)
Spoke with Irving Burton & verbal orders given for PT.  She asked again about a DME for the wheelchair & stated that it will need to be send to a medical supply store.  Order pended below

## 2023-02-26 NOTE — Addendum Note (Signed)
Addended by: Glori Luis on: 02/26/2023 11:28 AM   Modules accepted: Orders

## 2023-02-26 NOTE — Addendum Note (Signed)
Addended by: Warden Fillers on: 02/26/2023 10:42 AM   Modules accepted: Orders

## 2023-02-27 ENCOUNTER — Other Ambulatory Visit: Payer: Self-pay | Admitting: Family Medicine

## 2023-02-27 ENCOUNTER — Other Ambulatory Visit: Payer: Self-pay

## 2023-02-27 DIAGNOSIS — F331 Major depressive disorder, recurrent, moderate: Secondary | ICD-10-CM

## 2023-02-27 NOTE — Patient Outreach (Signed)
  Care Management  Transitions of Care Program Transitions of Care Post-discharge week 3  02/27/2023 Name: [name] MRN: [id number] DOB: 08-29-1950  Subjective: Anne [name] is a 73 y.o. year old female who is a primary care patient of Birdie Sons, Yehuda Mao, MD. The Care Management team spoke with patient by telephone to assess and address transitions of care needs.   Plan: No further outreach attempts will be made at this time.  We have been unable to reach the patient.  The patient did not answer the phone.  Deidre Ala [facility] [employer]  [phone]

## 2023-02-28 ENCOUNTER — Telehealth: Payer: Self-pay

## 2023-02-28 NOTE — Telephone Encounter (Signed)
Pt has appt scheduled for 03/04/23

## 2023-02-28 NOTE — Telephone Encounter (Signed)
Copied from CRM [id number]. Topic: Clinical - Home Health Verbal Orders >> Feb 28, 2023 12:06 PM Corin V wrote: Caller/Agency: [facility] Callback Number: [phone] Service Requested: Occupational Therapy Frequency: 2 times/week for 4 weeks Any new concerns about the patient? No

## 2023-03-01 ENCOUNTER — Other Ambulatory Visit: Payer: Self-pay

## 2023-03-01 ENCOUNTER — Telehealth: Payer: Self-pay

## 2023-03-01 NOTE — Telephone Encounter (Signed)
It is okay to give verbal orders for occupational therapy.

## 2023-03-01 NOTE — Telephone Encounter (Signed)
Called Anne [name] to ask these question about her mom.

## 2023-03-01 NOTE — Telephone Encounter (Signed)
Copied from CRM [id number]. Topic: Clinical - Medical Advice >> Mar 01, 2023  3:56 PM Efraim Kaufmann C wrote: Reason for CRM: Home Health Physical Therapist Orvilla Fus calling to report that patient had a fall this morning. Also wanted to mention that patient has dementia and someone tried to call earlier and husband passed the phone to her and she may not have gathered all that was said.Callback number for physical therapist is  [phone] Thank you

## 2023-03-01 NOTE — Telephone Encounter (Signed)
Called and spoke to PT about patient.

## 2023-03-01 NOTE — Telephone Encounter (Signed)
Verbal order given

## 2023-03-01 NOTE — Patient Outreach (Signed)
  Care Management  Transitions of Care Program Transitions of Care Post-discharge week 3  03/01/2023 Name: JAZIA FARACI MRN: [id number] DOB: [birth year]  Subjective: KATILIN RAYNES is a 73 y.o. year old female who is a primary care patient of Birdie Sons, Yehuda Mao, MD. The Care Management team was unable to reach the patient by phone to assess and address transitions of care needs.   Plan: No further outreach attempts will be made at this time.  We have been unable to reach the patient.  Sig

## 2023-03-01 NOTE — Patient Outreach (Signed)
  Care Management  Transitions of Care Program Transitions of Care Post-discharge week 3  03/01/2023 Name: [name] MRN: [id number] DOB: 06/08/1950  Subjective: Anne [name] is a 73 y.o. year old female who is a primary care patient of Birdie Sons, Yehuda Mao, MD. The Care Management team was unable to reach the patient by phone to assess and address transitions of care needs.   Plan: Additional outreach attempts will be made to reach the patient enrolled in the [facility] Program (Post Inpatient/ED Visit).  RNCM has attempted twice this week to call patient and the phone rings but there is no Voicemail to leave a message.   Deidre Ala, BSN, RN [place]  VBCI - [employer] [facility] RN Care Manager [phone]

## 2023-03-04 ENCOUNTER — Telehealth: Payer: Self-pay

## 2023-03-04 ENCOUNTER — Ambulatory Visit: Payer: PPO | Admitting: Neurosurgery

## 2023-03-04 ENCOUNTER — Ambulatory Visit
Admission: RE | Admit: 2023-03-04 | Discharge: 2023-03-04 | Disposition: A | Discharge status: Home / Self Care | Payer: PPO | Source: Ambulatory Visit | Attending: Neurosurgery | Admitting: Neurosurgery

## 2023-03-04 ENCOUNTER — Encounter: Payer: Self-pay | Admitting: Neurosurgery

## 2023-03-04 ENCOUNTER — Other Ambulatory Visit: Payer: Self-pay

## 2023-03-04 VITALS — BP 114/76 | Ht 68.0 in | Wt 142.0 lb

## 2023-03-04 DIAGNOSIS — I6200 Nontraumatic subdural hemorrhage, unspecified: Secondary | ICD-10-CM | POA: Diagnosis not present

## 2023-03-04 DIAGNOSIS — W19XXXD Unspecified fall, subsequent encounter: Secondary | ICD-10-CM | POA: Diagnosis not present

## 2023-03-04 DIAGNOSIS — S065XAD Traumatic subdural hemorrhage with loss of consciousness status unknown, subsequent encounter: Secondary | ICD-10-CM

## 2023-03-04 DIAGNOSIS — S065XAA Traumatic subdural hemorrhage with loss of consciousness status unknown, initial encounter: Secondary | ICD-10-CM | POA: Insufficient documentation

## 2023-03-04 DIAGNOSIS — M25551 Pain in right hip: Secondary | ICD-10-CM | POA: Diagnosis not present

## 2023-03-04 NOTE — Telephone Encounter (Signed)
Called pharmacy stated that the insurance would cover it, but the co-pay is 100.00 and the pt did not want to pay that.

## 2023-03-04 NOTE — Telephone Encounter (Signed)
Copied from CRM [id number]. Topic: General - Other >> Mar 01, 2023  5:09 PM Tiffany H wrote: Reason for CRM: Patient's daughter [name] called to follow up on call out from clinic. CAL closed at time of callback. Advised of turnaround time.

## 2023-03-04 NOTE — Telephone Encounter (Signed)
Spoke to daughter mom.

## 2023-03-04 NOTE — Telephone Encounter (Signed)
Called and spoke to pt daughter and she answered question that Dr. Birdie Sons needed for DME order.

## 2023-03-04 NOTE — Telephone Encounter (Signed)
Called pt daughter to find out where she wants the DME order fax to or if she wants to pick up and take to a medical supply store. Left message to return call to our office.

## 2023-03-04 NOTE — Progress Notes (Unsigned)
Referring Physician:  No referring provider defined for this encounter.  Primary Physician:  Glori Luis, MD  History of Present Illness: 03/04/2023 Anne [name] is here today with a chief complaint of a Subdural hematoma.  She has been following with [place] intermittently after a cranial trauma.  Had a subdural which was treated conservatively.  When she was in the hospital at [facility] rehabilitation and had another fall which was complicated by significant larger subdural hematoma.  At that time both her and her family were against the idea of undergoing a large craniotomy for surgical evacuation so they continue to follow her conservatively.  She is here today for follow-up with repeat head CT.  She states that she does have some head pain but that is not a significant factor.  She does feel worsened right sided body weakness and has had a recent fall.  She is followed by neurology and is seeing them on Friday for her seizure management.  She is currently at home with her partner as her caretaker.  I have utilized the care everywhere function in epic to review the outside records available from external health systems.  Review of Systems:  A 10 point review of systems is negative, except for the pertinent positives and negatives detailed in the HPI.  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   Metastatic bone cancer    Seizures (HCC)    Subdural hematoma (HCC)     Past Surgical History: Past Surgical History:  Procedure Laterality Date   APPENDECTOMY  1971   CHOLECYSTECTOMY  1971   COLONOSCOPY WITH PROPOFOL N/A 11/15/2016   Procedure: COLONOSCOPY WITH PROPOFOL;  Surgeon: Wyline Mood, MD;  Location: [facility]  ENDOSCOPY;  Service: Gastroenterology;  Laterality: N/A;   ENDOBRONCHIAL ULTRASOUND Right 07/30/2018   Procedure: ENDOBRONCHIAL ULTRASOUND;  Surgeon: Shane Crutch, MD;  Location: [facility] ORS;  Service: Pulmonary;  Laterality: Right;   ESOPHAGOGASTRODUODENOSCOPY (EGD) WITH PROPOFOL N/A 01/07/2017   Procedure: ESOPHAGOGASTRODUODENOSCOPY (EGD) WITH PROPOFOL;  Surgeon: Wyline Mood, MD;  Location: [facility] ENDOSCOPY;  Service: Gastroenterology;  Laterality: N/A;   LAPAROSCOPY N/A 03/01/2017   Procedure: LAPAROSCOPY DIAGNOSTIC;  Surgeon: Earline Mayotte, MD;  Location: [facility] ORS;  Service: General;  Laterality: N/A;   PORTA CATH INSERTION N/A 08/14/2018   Procedure: PORTA CATH INSERTION;  Surgeon: Annice Needy, MD;  Location: [facility] INVASIVE CV LAB;  Service: Cardiovascular;  Laterality: N/A;   TONSILECTOMY, ADENOIDECTOMY, BILATERAL MYRINGOTOMY AND TUBES  1955   TONSILLECTOMY     VENTRAL HERNIA REPAIR N/A 03/01/2017   10 x 14 CM Ventralight ST mesh, intraperitoneal location.    VENTRAL HERNIA REPAIR N/A 03/01/2017   Procedure: HERNIA REPAIR VENTRAL ADULT;  Surgeon: Earline Mayotte, MD;  Location: [facility] ORS;  Service: General;  Laterality: N/A;    Allergies: Allergies as of 03/04/2023 - Review Complete 03/04/2023  Allergen Reaction Noted   Bee venom Swelling 03/01/2017    Medications:  Current Outpatient Medications:    acetaminophen (TYLENOL) 500 MG tablet, Take 1,000 mg by mouth every 6 (six) hours as needed for moderate pain (pain score 4-6)., Disp: , Rfl:    levETIRAcetam (KEPPRA) 1000 MG tablet,  Take 1 tablet (1,000 mg total) by mouth 2 (two) times daily., Disp: 60 tablet, Rfl: 2   levothyroxine (SYNTHROID) 112 MCG tablet, Take 1 tablet (112 mcg total) by mouth daily at 6 (six) AM., Disp: 30 tablet, Rfl: 0   memantine (NAMENDA) 10 MG tablet, Take 1 tablet (10 mg total) by mouth 2 (two) times daily., Disp: 60 tablet, Rfl: 11   Midazolam (NAYZILAM) 5 MG/0.1ML SOLN, Place 5 mg into the nose as  needed., Disp: 2 each, Rfl: 2   Multiple Vitamin (MULTIVITAMIN) tablet, Take 1 tablet by mouth daily., Disp: , Rfl:    rivastigmine (EXELON) 3 MG capsule, Take 1 capsule (3 mg total) by mouth 2 (two) times daily., Disp: 60 capsule, Rfl: 11   rosuvastatin (CRESTOR) 20 MG tablet, Take 1 tablet (20 mg total) by mouth daily., Disp: 90 tablet, Rfl: 3   sertraline (ZOLOFT) 100 MG tablet, TAKE 1 & 1/2 (ONE & ONE-HALF) TABLETS BY MOUTH ONCE DAILY, Disp: 135 tablet, Rfl: 0   [Paused] amLODipine (NORVASC) 5 MG tablet, Take 1 tablet by mouth once daily (Patient not taking: Reported on 03/04/2023), Disp: 90 tablet, Rfl: 0 No current facility-administered medications for this visit.  Facility-Administered Medications Ordered in Other Visits:    denosumab (XGEVA) injection 120 mg, 120 mg, Subcutaneous, Q30 days, Creig Hines, MD, 120 mg at 03/05/19 1525   heparin lock flush 100 UNIT/ML injection, , , ,   Social History: Social History   Tobacco Use   Smoking status: Former    Current packs/day: 0.00    Average packs/day: 2.0 packs/day for 50.0 years (100.0 ttl pk-yrs)    Types: Cigarettes, E-cigarettes    Start date: 02/06/1962    Quit date: 02/07/2012    Years since quitting: 11.0   Smokeless tobacco: Never  Vaping Use   Vaping status: Former   Start date: 10/06/2013   Devices: uses no liquid  Substance Use Topics   Alcohol use: No    Alcohol/week: 0.0 standard drinks of alcohol   Drug use: No    Family Medical History: Family History  Problem Relation Age of Onset   Hypertension Mother    Ovarian cancer Mother 10   Dementia Mother    Heart disease Father    Stroke Father    Seizures Father    Ovarian cancer Sister        ? dx cancer had hyst.   Breast cancer Neg Hx     Physical Examination: Vitals:   03/04/23 0943  BP: 114/76    General: Patient is in no apparent distress. Attention to examination is appropriate.  Neck:   Supple.  Full range of motion.  Respiratory: Patient  is breathing without any difficulty.   NEUROLOGICAL:     Her speech is her speech is fluent, she does have some slight slurring.  She is awake and alert.  She is oriented to person.  Does have some easy distractibility and tangentiality.  Cranial Nerves: Pupils equal round and reactive to light.  Facial shows mild right sided Hemi facial weakness.  Facial sensation is symmetric. Shoulder shrug is symmetric. Tongue protrusion is midline.    Strength: Significant upper extremity pronator drift noted, difficulty with proximal right lower extremity strength compared to the left.  Bilateral upper and lower extremity sensation is intact to light touch .   Imaging: Narrative & Impression  CLINICAL DATA:  73 year old female with a history of subdural hematoma.   EXAM: CT HEAD WITHOUT  CONTRAST   TECHNIQUE: Contiguous axial images were obtained from the base of the skull through the vertex without intravenous contrast.   RADIATION DOSE REDUCTION: This exam was performed according to the departmental dose-optimization program which includes automated exposure control, adjustment of the mA and/or kV according to patient size and/or use of iterative reconstruction technique.   COMPARISON:  Head CTs 02/03/2023 and earlier.  Brain MRI 06/21/2021.   FINDINGS: Brain: Mixed density, lobulated and septated appearing left side subdural hematoma. The largest lobulation is along the left posterosuperior convexity up to 2.4 cm in thickness there are (versus about 19 mm in December). At most other levels the subdural space thickness is between 10 and 17 mm. Increasing hyperdense curvilinear septation and/or blood products within the collection since last month (coronal image 44).   Intracranial mass effect. Rightward midline shift now is 5-6 mm, mildly regressed. Mass effect on the lateral ventricles has not significantly changed. No ventricular enlargement or trapping is evident. Basilar  cisterns remain patent.   No new area of intracranial hemorrhage. Stable gray-white matter differentiation throughout the brain. No cortically based acute infarct identified.   Vascular: Calcified atherosclerosis at the skull base. No suspicious intracranial vascular hyperdensity.   Skull: Stable visualized osseous structures. No skull fracture identified.   Sinuses/Orbits: Visualized paranasal sinuses and mastoids are stable and generally well aerated. Incidental small left ethmoid sinus osteoma.   Other: Left forehead scalp hematoma has regressed since December. Stable and negative other orbit and scalp soft tissues.   IMPRESSION: 1. Complex Left side Subdural Hematoma with mild additional progression since 02/03/2023. Mixed density, septated, and lobulated subdural collection now up to 24 mm maximum thickness (versus 18-19 mm last month). However, the subsequent intracranial mass effect and midline shift have not significantly changed.   2. No skull fracture or new intracranial abnormality identified.   Electronically Signed: By: Odessa Fleming M.D. On: 03/04/2023 09:33    I have personally reviewed the images and agree with the above interpretation.  Medical Decision Making/Assessment and Plan: Anne [name] is a pleasant 73 y.o. female with history of a recent fall with a subdural hematoma.  She was following in my clinic and recently had another fall and hospitalization at [facility].  This demonstrated a acute on chronic subdural hematoma with a significant amount of mass effect.  Family elected to not have any surgical intervention given the need for a craniectomy/craniotomy for hematoma evacuation and they did not feel that she would tolerate this with her issues.  She was admitted for worsening seizure control and has been followed by neurology, will follow-up with them on Friday.  On physical examination today she does demonstrate right sided hemiparesis, she states that her  headaches are well-controlled, they are currently being managed by neurology for seizures.  At this point they have a large left-sided chronic subdural hematoma with complex membrane formation, this likely that the expansion is secondary to membranous leakage.  We discussed indications for drainage which at this point could not be a burr hole operation rather than a full craniectomy.  Plan on reaching out to her neurology team to discuss plans moving forward and what would be best for this patient.  She does have some dementia, however if her seizures are being worsened by the presence of the subdural hematoma could consider bur hole drainage and subsequent MMA embolization to decrease the likelihood of recurrence.  Given the complexity of her situation would like it to be a multidisciplinary decision  involving both the patient/family as well as neurosurgery and neurology.  I will reach out to the neurology team today after clinic.  I spent 30 minutes with this patient discussing her care, coordinating her care, reviewing her imaging, direct patient evaluation, and discussion with family for ongoing planning.There are no diagnoses linked to this encounter.   Thank you for involving me in the care of this patient.    Lovenia Kim MD/MSCR Neurosurgery

## 2023-03-04 NOTE — Telephone Encounter (Signed)
Signed and placed in signed folder.

## 2023-03-04 NOTE — Telephone Encounter (Signed)
Noted. She needs to follow-up with her neurologist for her seizures.

## 2023-03-05 ENCOUNTER — Emergency Department: Payer: PPO

## 2023-03-05 ENCOUNTER — Other Ambulatory Visit: Payer: Self-pay

## 2023-03-05 ENCOUNTER — Emergency Department
Admission: EM | Admit: 2023-03-05 | Discharge: 2023-03-05 | Disposition: A | Discharge status: Home / Self Care | Payer: PPO | Attending: Student in an Organized Health Care Education/Training Program | Admitting: Student in an Organized Health Care Education/Training Program

## 2023-03-05 DIAGNOSIS — S7001XA Contusion of right hip, initial encounter: Secondary | ICD-10-CM | POA: Diagnosis not present

## 2023-03-05 DIAGNOSIS — W19XXXA Unspecified fall, initial encounter: Secondary | ICD-10-CM | POA: Insufficient documentation

## 2023-03-05 DIAGNOSIS — S79911A Unspecified injury of right hip, initial encounter: Secondary | ICD-10-CM | POA: Diagnosis present

## 2023-03-05 DIAGNOSIS — S7011XA Contusion of right thigh, initial encounter: Secondary | ICD-10-CM | POA: Diagnosis not present

## 2023-03-05 DIAGNOSIS — M8580 Other specified disorders of bone density and structure, unspecified site: Secondary | ICD-10-CM | POA: Diagnosis not present

## 2023-03-05 DIAGNOSIS — I700 Atherosclerosis of aorta: Secondary | ICD-10-CM | POA: Diagnosis not present

## 2023-03-05 DIAGNOSIS — M25551 Pain in right hip: Secondary | ICD-10-CM | POA: Diagnosis not present

## 2023-03-05 DIAGNOSIS — M47816 Spondylosis without myelopathy or radiculopathy, lumbar region: Secondary | ICD-10-CM | POA: Diagnosis not present

## 2023-03-05 DIAGNOSIS — R600 Localized edema: Secondary | ICD-10-CM | POA: Diagnosis not present

## 2023-03-05 MED ORDER — LIDOCAINE 5 % EX PTCH: 1.0000 | MEDICATED_PATCH | Freq: Two times a day (BID) | CUTANEOUS | 0 refills | Status: DC

## 2023-03-05 NOTE — Telephone Encounter (Signed)
Noted. Not sure there is much we can do if they won't answer the phone. Please contact the patient and the husband and encourage them to answer the phone when the social worker calls.

## 2023-03-05 NOTE — Telephone Encounter (Signed)
Mailing to the daughter to take to a medical supply store.

## 2023-03-05 NOTE — Telephone Encounter (Signed)
Pt has an appointment next week with neurology.

## 2023-03-05 NOTE — Telephone Encounter (Signed)
Called the daughter and the social worker has tired to reach out to them but pt will not answer the phone.

## 2023-03-05 NOTE — Telephone Encounter (Signed)
Will send letter to pt to answer the phone that social worker is trying to get in touch with them.

## 2023-03-05 NOTE — Telephone Encounter (Signed)
Letter sent out to pt.

## 2023-03-05 NOTE — ED Triage Notes (Signed)
Pt has had falls recently, fell out of bed 3 days ago, hit her right hip. Tender to the touch and not getting better

## 2023-03-05 NOTE — ED Provider Notes (Signed)
[facility] Provider Note    Event Date/Time   First MD Initiated Contact with Patient 03/05/23 1504     (approximate)   History   Fall and Head Injury   HPI  Quinci Gavidia Pepitone is a 73 y.o. female with a history of chronic subdural hematoma being managed conservatively as family is not interested in surgical intervention presents to the ER for worsening right hip pain status post fall.  She has had interval falls due to right sided weakness secondary to this known subdural hematoma.  Had repeat imaging yesterday showing worsening of the subdural.  Discussed this with the patient and caregiver and they still are not interested in surgical management.  Their only concern at this point come to the ER as her right hip pain and wanted to make sure there is no fracture.  She has not had any falls since yesterday CT.     Physical Exam   Triage Vital Signs: ED Triage Vitals  Encounter Vitals Group     BP 03/05/23 1010 118/73     Systolic BP Percentile --      Diastolic BP Percentile --      Pulse Rate 03/05/23 1010 81     Resp 03/05/23 1010 17     Temp 03/05/23 1010 98.9 F (37.2 C)     Temp Source 03/05/23 1010 Oral     SpO2 03/05/23 1010 91 %     Weight 03/05/23 1011 147 lb (66.7 kg)     Height 03/05/23 1011 5\' 8"  (1.727 m)     Head Circumference --      Peak Flow --      Pain Score 03/05/23 1013 6     Pain Loc --      Pain Education --      Exclude from Growth Chart --     Most recent vital signs: Vitals:   03/05/23 1622 03/05/23 1624  BP:  133/73  Pulse:  70  Resp:  16  Temp: 97.8 F (36.6 C)   SpO2:  100%     Constitutional: Alert  Eyes: Conjunctivae are normal.  Head: Atraumatic. Nose: No congestion/rhinnorhea. Mouth/Throat: Mucous membranes are moist.   Neck: Painless ROM.  Cardiovascular:   Good peripheral circulation. Respiratory: Normal respiratory effort.  No retractions.  Gastrointestinal: Soft and nontender.  Musculoskeletal:   no deformity Neurologic:  MAE spontaneously. No gross focal neurologic deficits are appreciated.  Skin:  Skin is warm, dry and intact. No rash noted. Psychiatric: Mood and affect are normal. Speech and behavior are normal.    ED Results / Procedures / Treatments   Labs (all labs ordered are listed, but only abnormal results are displayed) Labs Reviewed - No data to display   EKG     RADIOLOGY Please see ED Course for my review and interpretation.  I personally reviewed all radiographic images ordered to evaluate for the above acute complaints and reviewed radiology reports and findings.  These findings were personally discussed with the patient.  Please see medical record for radiology report.    PROCEDURES:  Critical Care performed: No  Procedures   MEDICATIONS ORDERED IN ED: Medications - No data to display   IMPRESSION / MDM / ASSESSMENT AND PLAN / ED COURSE  I reviewed the triage vital signs and the nursing notes.  Differential diagnosis includes, but is not limited to, fracture, contusion, hematoma, ligamentous injury  Patient presenting with a right hip pain as described above.  X-ray ordered out of triage does not show any evidence of fracture or dislocation but given the pain will order CT imaging due to concern for occult fracture.   Clinical Course as of 03/05/23 1639  Tue Mar 05, 2023  1558 CT imaging on my review and interpretation shows hematoma but I do not see evidence of displaced fracture will await formal radiology report. [PR]  1638 CT with evidence of edema contusion of the right hip and thigh.  Not consistent with Norma Fredrickson lesion.  Does have quite a bit of bruising and ecchymosis but no sign of infection.  Further discussion with patient and family there is still not interested in pursuing surgical management of known subdural hematoma that is likely causing these falls.  They are requesting discharge home.   They do have follow-up with neurosurgery as an outpatient. [PR]    Clinical Course User Index [PR] Willy Eddy, MD     FINAL CLINICAL IMPRESSION(S) / ED DIAGNOSES   Final diagnoses:  Contusion of right hip and thigh, initial encounter     Rx / DC Orders   ED Discharge Orders          Ordered    lidocaine (LIDODERM) 5 %  Every 12 hours        03/05/23 1635             Note:  This document was prepared using Dragon voice recognition software and may include unintentional dictation errors.    Willy Eddy, MD 03/05/23 [id number]

## 2023-03-05 NOTE — ED Provider Triage Note (Signed)
Emergency Medicine Provider Triage Evaluation Note  Anne [name] , a 73 y.o. female  was evaluated in triage.  Pt complains of right hip pain.  Larey Seat out of bed  unwitnessed 3 days ago.   Hx of  subdural hematoma 1 month ago from a fall.  Had CT head done yesterday for f/u hospitalization.   No blood thinners.  Review of Systems  Positive: + right hip pain, + dementia  + subdural hematoma Negative: No lacerations  Physical Exam  There were no vitals taken for this visit. Gen:   Awake, no distress  pleasant, cooperative.  HOH Resp:  Normal effort  MSK:   Tender right hip, no deformity noted and patient sitting in wheelchair.  Other:    Medical Decision Making  Medically screening exam initiated at 10:10 AM.  Appropriate orders placed.  Shemeika Starzyk Prunty was informed that the remainder of the evaluation will be completed by another provider, this initial triage assessment does not replace that evaluation, and the importance of remaining in the ED until their evaluation is complete.     Tommi Rumps, PA-C 03/05/23 1019

## 2023-03-08 ENCOUNTER — Ambulatory Visit: Payer: PPO | Admitting: Neurology

## 2023-03-08 ENCOUNTER — Other Ambulatory Visit: Payer: Self-pay

## 2023-03-08 ENCOUNTER — Encounter: Payer: Self-pay | Admitting: Neurology

## 2023-03-08 ENCOUNTER — Other Ambulatory Visit: Payer: Self-pay | Admitting: Neurology

## 2023-03-08 VITALS — BP 143/79 | HR 69 | Ht 65.0 in | Wt 147.5 lb

## 2023-03-08 DIAGNOSIS — I6203 Nontraumatic chronic subdural hemorrhage: Secondary | ICD-10-CM

## 2023-03-08 DIAGNOSIS — I6201 Nontraumatic acute subdural hemorrhage: Secondary | ICD-10-CM | POA: Diagnosis not present

## 2023-03-08 DIAGNOSIS — R569 Unspecified convulsions: Secondary | ICD-10-CM | POA: Diagnosis not present

## 2023-03-08 DIAGNOSIS — F02B18 Dementia in other diseases classified elsewhere, moderate, with other behavioral disturbance: Secondary | ICD-10-CM

## 2023-03-08 DIAGNOSIS — G301 Alzheimer's disease with late onset: Secondary | ICD-10-CM | POA: Diagnosis not present

## 2023-03-08 DIAGNOSIS — R296 Repeated falls: Secondary | ICD-10-CM | POA: Diagnosis not present

## 2023-03-08 MED ORDER — NAYZILAM 5 MG/0.1ML NA SOLN
5.0000 mg | NASAL | 2 refills | Status: AC | PRN
  Filled 2023-03-11: qty 2, 6d supply, fill #0

## 2023-03-08 MED ORDER — LEVETIRACETAM 1000 MG PO TABS
1000.0000 mg | ORAL_TABLET | Freq: Two times a day (BID) | ORAL | 3 refills | Status: DC
  Filled 2023-03-08: qty 180, 90d supply, fill #0

## 2023-03-08 NOTE — Progress Notes (Addendum)
[name]  PATIENT: Anne [name] DOB: 01-21-1951  REQUESTING CLINICIAN: Glori Luis, MD HISTORY FROM: Patient and husband  REASON FOR VISIT: Memory decline    HISTORICAL  CHIEF COMPLAINT: Dementia follow-up  INTERVAL HISTORY 03/08/2023: Patient presents today for follow-up, she is accompanied by her husband.  Last visit visit was in October, at that time we discussed about her worsening dementia, need to use assistive device with ambulation.  Unfortunately on October 20 she did have a fall and had a subdural hematoma.  December 14 she had another fall, workup indicated worsening of the hematoma.  On December 17 she presented to the hospital for seizures, patient was started on Keppra.  At that time discussion with neurosurgery plan was to treat the hematoma conservatively.  While she was admitted in rehab, patient did have another fall which resulted in worsening of the hematoma but again plain for treatment was conservatively.  Husband tells me since discharge from the hospital she has unfortunately she has not had any additional fall but she continued to walk without the use of assistive device. At discharge from the hospital, patient Seroquel Bupropion and Depakote were discontinued. In term of her dementia, husband tells me that her memory is still poor, she is still very agitated and irritable at times.  She is compliant with her medications and still reports hallucinations.   INTERVAL HISTORY 11/14/2022:  Patient presents today for follow-up, he is accompanied by husband.  Last visit was in April, since then husband tells me that she is about the same.  She still have hallucinations, seeing her deceased parents.  Husband tells me at time, she will argue with him and telling him that we need to go home thinking that they live at her parents house.  She is also making reference to 2 dogs when in fact they have 1.   Her memory is still poor, she does have  paranoid and delusions.  She has been having multiple falls, has a walker but does not use it.  Husband is also reporting sundowning and seasonal depression.  They are on the Seroquel 12.5 mg twice daily.  Husband feels the Seroquel is helpful.   INTERVAL HISTORY 05/09/2022:  Patient presents today for follow-up, she is accompanied by her husband.  Since last visit in January, husband reports patient has been doing better.  At 1 point She did have a UTI and at that time was very confused and her memory got worse.  Since the UTI has been treated she has been doing well.  She tolerates the Seroquel well, currently on 12.5 mg nightly but still of behavioral issues described as hallucinations, seeing things that are not there, she thinks that her daughter and grandchildren live in the house, talks about them all the time but they live in [place].  She is compliant with the rest of her medication including Exelon, Namenda and Depakote.  She had follow-up with her PCP who referred patient to home health. No seizures since last visit.    INTERVAL HISTORY 02/19/2022:  Patient presents today for follow-up, she is accompanied by [facility].  He reports that memory is getting poor and worse.  She is compliant with the Exelon 1.5 mg twice daily but has not seen any improvement.  They are time that she still wants to leave the house but has not done so.  She is on Seroquel 12.5 mg nightly.  Anne [name] reports that sleep is good.  He is the only  one who helps her; He does work however 3 times a week and there are times where patient is at home alone. There is also of a seizure a month ago. Seizures described as staring spell and being unresponsive. Anne [name] reports the day of the headaches, she complained of bad headaches. She has not had any additional events since leaving the hospital.    INTERVAL HISTORY 08/15/21 Patient presents today with husband for follow up worsening memory, and additional behavioral changes. She has  been non compliant with her medications. Husband has noted that patient has been packing up the car thinking she is about ot move. She has been talking about her mother in [place] when her mother passed away.  Currently she is not  driving, last time she drove to [place] for a weddig when there was no wedding, husband has to call a silver alert on her. At home, she does not cook, use the microwave for frozen dinner. Husband reports that she needs help with showeing and dressing. She misplaces items all the time.  They already have home health services that come once a week and there are complaints of multiple falls.    HISTORY OF PRESENT ILLNESS:  This is a 73 year old woman with past medical history of lung cancer currently getting chemo every 3 to 4 weeks, with bone metastasis, depression (severe during wintertime) hypertension, hypothyroidism, and hyperlipidemia who is presenting with memory decline.  Patient stated that her memory is terrible, she forgets a lot of things.  She has issue with remote memory and also report forgetting recent conversations.  She also misplaces a lot of things.  She currently drives, denies any recent accident, denies being lost in familiar places.  She can handle her financial affairs by herself, denies being late on her bills.  She knows her family members well, do not confuse them with other people.  She has started Prevagen and reports some improvement with the medication.   Husband stated patient is more forgetful than before, and mainly misplace a lot of things.  He has to repeat himself multiple times because patient will keep on asking the same questions over and over.   She has not been wandering outside her house.  She is able to complete all activities of daily living, she is able to cook, clean, bathe and dress herself.     OTHER MEDICAL CONDITIONS: lung cancer chemo in remission but still undergoing treatment every 3 to 4 weeks, Depression (severe  during the winter), HTN, Hypothyroidism, HLD   REVIEW OF SYSTEMS: Full 14 system review of systems performed and negative with exception of: as noted in the HPI   ALLERGIES: Allergies  Allergen Reactions   Bee Venom Swelling    HOME MEDICATIONS: Outpatient Medications Prior to Visit  Medication Sig Dispense Refill   acetaminophen (TYLENOL) 500 MG tablet Take 1,000 mg by mouth every 6 (six) hours as needed for moderate pain (pain score 4-6).     amLODipine (NORVASC) 5 MG tablet Take 1 tablet by mouth once daily 90 tablet 0   levETIRAcetam (KEPPRA) 1000 MG tablet Take 1 tablet (1,000 mg total) by mouth 2 (two) times daily. 60 tablet 2   levothyroxine (SYNTHROID) 112 MCG tablet Take 1 tablet (112 mcg total) by mouth daily at 6 (six) AM. 30 tablet 0   lidocaine (LIDODERM) 5 % Place 1 patch onto the skin every 12 (twelve) hours. Remove & Discard patch within 12 hours or as directed by MD 10 patch  0   memantine (NAMENDA) 10 MG tablet Take 1 tablet (10 mg total) by mouth 2 (two) times daily. 60 tablet 11   Midazolam (NAYZILAM) 5 MG/0.1ML SOLN Place 5 mg into the nose as needed. 2 each 2   Multiple Vitamin (MULTIVITAMIN) tablet Take 1 tablet by mouth daily.     rivastigmine (EXELON) 3 MG capsule Take 1 capsule (3 mg total) by mouth 2 (two) times daily. 60 capsule 11   rosuvastatin (CRESTOR) 20 MG tablet Take 1 tablet (20 mg total) by mouth daily. 90 tablet 3   sertraline (ZOLOFT) 100 MG tablet TAKE 1 & 1/2 (ONE & ONE-HALF) TABLETS BY MOUTH ONCE DAILY 135 tablet 0   Facility-Administered Medications Prior to Visit  Medication Dose Route Frequency Provider Last Rate Last Admin   denosumab (XGEVA) injection 120 mg  120 mg Subcutaneous Q30 days Creig Hines, MD   120 mg at 03/05/19 1525   heparin lock flush 100 UNIT/ML injection             PAST MEDICAL HISTORY: Past Medical History:  Diagnosis Date   Arthritis    Cirrhosis (HCC)    Dementia (HCC)    Depression    GERD (gastroesophageal  reflux disease)    Hyperlipidemia    Hypertension    Lung cancer (HCC)    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), in remission, 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   Metastatic bone cancer    Seizures (HCC)    Subdural hematoma (HCC)     PAST SURGICAL HISTORY: Past Surgical History:  Procedure Laterality Date   APPENDECTOMY  1971   CHOLECYSTECTOMY  1971   COLONOSCOPY WITH PROPOFOL N/A 11/15/2016   Procedure: COLONOSCOPY WITH PROPOFOL;  Surgeon: Wyline Mood, MD;  Location: [facility] ENDOSCOPY;  Service: Gastroenterology;  Laterality: N/A;   ENDOBRONCHIAL ULTRASOUND Right 07/30/2018   Procedure: ENDOBRONCHIAL ULTRASOUND;  Surgeon: Shane Crutch, MD;  Location: [facility] ORS;  Service: Pulmonary;  Laterality: Right;   ESOPHAGOGASTRODUODENOSCOPY (EGD) WITH PROPOFOL N/A 01/07/2017   Procedure: ESOPHAGOGASTRODUODENOSCOPY (EGD) WITH PROPOFOL;  Surgeon: Wyline Mood, MD;  Location: [facility] ENDOSCOPY;  Service: Gastroenterology;  Laterality: N/A;   LAPAROSCOPY N/A 03/01/2017   Procedure: LAPAROSCOPY DIAGNOSTIC;  Surgeon: Earline Mayotte, MD;  Location: [facility] ORS;  Service: General;  Laterality: N/A;   PORTA CATH INSERTION N/A 08/14/2018   Procedure: PORTA CATH INSERTION;  Surgeon: Annice Needy, MD;  Location: [facility] INVASIVE CV LAB;  Service: Cardiovascular;  Laterality: N/A;   TONSILECTOMY, ADENOIDECTOMY, BILATERAL MYRINGOTOMY AND TUBES  1955   TONSILLECTOMY     VENTRAL HERNIA REPAIR N/A 03/01/2017   10 x 14 CM Ventralight ST mesh, intraperitoneal location.    VENTRAL HERNIA REPAIR N/A 03/01/2017   Procedure: HERNIA REPAIR VENTRAL ADULT;  Surgeon: Earline Mayotte, MD;  Location: [facility] ORS;  Service: General;  Laterality: N/A;    FAMILY HISTORY: Family History  Problem Relation Age of Onset   Hypertension Mother    Ovarian cancer Mother 71   Dementia Mother    Heart disease Father    Stroke Father    Seizures Father    Ovarian cancer Sister        ? dx cancer had hyst.   Breast cancer Neg Hx      SOCIAL HISTORY: Social History   Socioeconomic History   Marital status: Divorced    Spouse name: Not on file   Number of children: 2   Years of education: Not on file   Highest education level: High  school graduate  Occupational History   Not on file  Tobacco Use   Smoking status: Former    Current packs/day: 0.00    Average packs/day: 2.0 packs/day for 50.0 years (100.0 ttl pk-yrs)    Types: Cigarettes, E-cigarettes    Start date: 02/06/1962    Quit date: 02/07/2012    Years since quitting: 11.0   Smokeless tobacco: Never  Vaping Use   Vaping status: Former   Start date: 10/06/2013   Devices: uses no liquid  Substance and Sexual Activity   Alcohol use: No    Alcohol/week: 0.0 standard drinks of alcohol   Drug use: No   Sexual activity: Not Currently  Other Topics Concern   Not on file  Social History Narrative   Married   Retired   Engineer, manufacturing systems level of education    No children    1 cup of coffee   Social Drivers of Corporate investment banker Strain: Low Risk  (02/22/2023)   Overall Financial Resource Strain (CARDIA)    Difficulty of Paying Living Expenses: Not very hard  Food Insecurity: No Food Insecurity (02/20/2023)   Hunger Vital Sign    Worried About Running Out of Food in the Last Year: Never true    Ran Out of Food in the Last Year: Never true  Transportation Needs: No Transportation Needs (02/20/2023)   PRAPARE - Administrator, Civil Service (Medical): No    Lack of Transportation (Non-Medical): No  Physical Activity: Inactive (02/22/2023)   Exercise Vital Sign    Days of Exercise per Week: 0 days    Minutes of Exercise per Session: 0 min  Stress: Patient Unable To Answer (02/22/2023)   [employer] [employer] - Occupational Stress Questionnaire    Feeling of Stress : Patient unable to answer  Social Connections: Moderately Isolated (02/20/2023)   Social Connection and Isolation Panel [NHANES]    Frequency of  Communication with Friends and Family: Once a week    Frequency of Social Gatherings with Friends and Family: More than three times a week    Attends Religious Services: Never    Database administrator or Organizations: No    Attends Banker Meetings: Never    Marital Status: Married  Catering manager Violence: Not At Risk (02/20/2023)   Humiliation, Afraid, Rape, and Kick questionnaire    Fear of Current or Ex-Partner: No    Emotionally Abused: No    Physically Abused: No    Sexually Abused: No     PHYSICAL EXAM  GENERAL EXAM/CONSTITUTIONAL: Vitals:  Vitals:   03/08/23 0932  BP: (!) 143/79  Pulse: 69  Weight: 147 lb 8 oz (66.9 kg)  Height: 5\' 5"  (1.651 m)    Body mass index is 24.55 kg/m. Wt Readings from Last 3 Encounters:  03/08/23 147 lb 8 oz (66.9 kg)  03/05/23 147 lb (66.7 kg)  03/04/23 142 lb (64.4 kg)   Patient is in no distress; well developed, nourished and groomed; neck is supple  EYES: Visual fields full to confrontation, Extraocular movements intacts,   MUSCULOSKELETAL: Gait, strength, tone, movements noted in Neurologic exam below  NEUROLOGIC: MENTAL STATUS:     01/19/2021    8:32 AM 03/14/2020   10:47 AM 06/04/2017    9:34 AM  MMSE - Mini Mental State Exam  Orientation to time 4 4 5   Orientation to Place 5 5 5   Registration 3 3 3   Attention/ Calculation 2 5 5  Recall 0 0 0  Language- name 2 objects 2 2 2   Language- repeat 1 1 1   Language- follow 3 step command 3 2 3   Language- read & follow direction 1 1 1   Write a sentence 1 1 1   Copy design 1 1 1   Total score 23 25 27       02/19/2022    9:52 AM 08/15/2021    1:32 PM  Montreal Cognitive Assessment   Visuospatial/ Executive (0/5) 1 2  Naming (0/3) 1 2  Attention: Read list of digits (0/2) 2 2  Attention: Read list of letters (0/1) 1 1  Attention: Serial 7 subtraction starting at 100 (0/3) 2 3  Language: Repeat phrase (0/2) 2 2  Language : Fluency (0/1) 0 0  Abstraction  (0/2) 1 1  Delayed Recall (0/5) 0 0  Orientation (0/6) 2 3  Total 12 16  Adjusted Score (based on education) 12      CRANIAL NERVE:  2nd, 3rd, 4th, 6th - visual fields full to confrontation, extraocular muscles intact, no nystagmus 5th - facial sensation symmetric 7th - facial strength symmetric 8th - hearing intact 9th - palate elevates symmetrically, uvula midline 11th - shoulder shrug symmetric 12th - tongue protrusion midline  MOTOR:  normal bulk and tone. She does have a right pronation and slight drift. Normal grip strength. Decrease right shoulder abduction 4/5 (?limited by pain), elbow flexion/extension 4/5.  Right hip 4/5 but limited by pain. Patient has a large hematoma around the right hip  SENSORY:  normal and symmetric to light touch  COORDINATION:  finger-nose-finger, fine finger movements normal  GAIT/STATION:  Deferred, using a wheelchair    DIAGNOSTIC DATA (LABS, IMAGING, TESTING) - I reviewed patient records, labs, notes, testing and imaging myself where available.  Lab Results  Component Value Date   WBC 8.1 02/25/2023   HGB 13.5 02/25/2023   HCT 41.7 02/25/2023   MCV 94.8 02/25/2023   PLT 235.0 02/25/2023      Component Value Date/Time   NA 139 02/15/2023 0720   NA 142 07/17/2021 1347   K 4.6 02/15/2023 0720   CL 101 02/15/2023 0720   CO2 30 02/15/2023 0720   GLUCOSE 85 02/15/2023 0720   BUN 24 (H) 02/15/2023 0720   BUN 12 07/17/2021 1347   CREATININE 1.00 02/15/2023 0720   CREATININE 1.09 02/26/2012 0925   CALCIUM 9.3 02/15/2023 0720   PROT 5.5 (L) 01/22/2023 0917   PROT 7.1 07/17/2021 1347   ALBUMIN 2.8 (L) 01/22/2023 0917   ALBUMIN 4.5 07/17/2021 1347   AST 83 (H) 01/22/2023 0917   ALT 75 (H) 01/22/2023 0917   ALKPHOS 142 (H) 01/22/2023 0917   BILITOT 0.7 01/22/2023 0917   BILITOT 0.4 07/17/2021 1347   GFRNONAA 59 (L) 02/15/2023 0720   GFRNONAA 54 (L) 02/26/2012 0925   GFRAA >60 11/05/2019 2025   GFRAA >60 02/26/2012 0925    Lab Results  Component Value Date   CHOL 177 08/24/2020   HDL 54.90 08/24/2020   LDLCALC 82 08/24/2020   LDLDIRECT 95.0 07/22/2019   TRIG 198.0 (H) 08/24/2020   CHOLHDL 3 08/24/2020   Lab Results  Component Value Date   HGBA1C 6.1 09/12/2016   Lab Results  Component Value Date   VITAMINB12 1,235 (H) 10/01/2018   Lab Results  Component Value Date   TSH 11.31 (H) 02/25/2023    MRI Brain 08/2020 1. No evidence of intracranial metastasis. 2. Evidence of right mandibular head metastasis which is new  from 2020 brain MRI and possibly new from a February 2022 bone scan.  Head CT 02/07/2022 1. No acute intracranial abnormality. 2. Chronic microvascular disease. 3. Mild sphenoid sinus and left posterior ethmoid air cell inflammation.  Head CT 11/25/2022 1. Approximately 6 mm thick mixed density left cerebral convexity subdural hematoma. 2. Smaller isodense 3 mm right subdural hemorrhage. 3. No significant midline shift.  CT Head 02/03/2023 1. Minimally increased size of the mixed attenuation left holo hemispheric subdural hematoma, now measuring up to 18 mm, previously 17 mm. Slightly increased rightward midline shift, now measuring up to 9 mm, previously 8 mm. 2. Slight left uncal herniation, new from prior.   CT Head 03/04/2023 1. Complex Left side Subdural Hematoma with mild additional progression since 02/03/2023. Mixed density, septated, and lobulated subdural collection now up to 24 mm maximum thickness (versus 18-19 mm last month). However, the subsequent intracranial mass effect and midline shift have not significantly changed. 2. No skull fracture or new intracranial abnormality identified   ASSESSMENT AND PLAN  73 y.o. year old female with history lung cancer on chemo with bone metastasis, depression (severe during wintertime) hypertension, hypothyroidism, and hyperlipidemia, dementia and now subdural hematoma who is presenting for follow up.  At last visit in  October plan was to continue her current medications including Seroquel and Depakote..  But she did unfortunately had a fall, developed subdural hematoma complicated by seizures.  During her last hospital admission, the Depakote and Seroquel were discontinued.  Family is still reporting hallucinations and some behavior change, mainly irritability.  When it comes to the subdural hematoma, it is complex, 24 mm maximum thickness with intracranial mass effect and midline shift.  Patient has seen neurosurgery, at that visit discussion included burr hole with subsequent MMA embolization to decrease the likelihood of reoccurrence of the hematoma.  Today patient is awake, she is alert, cognitively she is at her baseline.  On exam she does have a right pronator and slight drift, mild weakness of the right upper and lower extremity but this can be worsened by pain also.  Husband tells me since the first seizure on December 17 and patient starting Keppra she has not had any additional seizures.  Again husband is not pushing toward surgery.  She is still a fall risk, as she does not use the walker continuously at home. With her dementia, she does not have capacity to understand the importance of using a walker and I am afraid she will continue to ambulate without a walker and subsequently have additional falls. At this time, since her weakness is minimal, her seizures are controlled, she does not have any worsening in her mental status, I think patient can be managed conservatively.  I will reach out to neurosurgery to discuss my opinion.  I spent extended amount of time with her and her husband discussing the need to call EMS including a decrease in the level of consciousness, if patient not waking up, any additional seizures, and worsening of the weakness in the right upper or right lower extremity, they need to call EMS for any seizures.  He voiced understanding.  Advised him to continue following up with neurosurgery and  I will see them in 3 months or a sooner follow-up. We will consider repeating head CT if not done during this time.     1. 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 Falls frequently   3. Seizures (HCC)   4. Acute on  chronic intracranial subdural hematoma [facility])       Patient Instructions  Continue with current medications  Please continue to use assistive device with ambulation  Will reach out to Neurosurgery to discussed conservative management plan  Return in 3 months or sooner if worse     No orders of the defined types were placed in this encounter.    No orders of the defined types were placed in this encounter.    Return in about 6 months (around 09/05/2023).    Windell Norfolk, MD 03/08/2023, 3:09 PM  [name] [address], [address] [place], [name] [address] [phone]

## 2023-03-08 NOTE — Patient Instructions (Signed)
Continue with current medications  Please continue to use assistive device with ambulation  Will reach out to Neurosurgery to discussed conservative management plan  Return in 3 months or sooner if worse

## 2023-03-10 ENCOUNTER — Other Ambulatory Visit: Payer: Self-pay

## 2023-03-11 ENCOUNTER — Telehealth: Payer: PPO | Admitting: Neurosurgery

## 2023-03-11 ENCOUNTER — Other Ambulatory Visit: Payer: Self-pay

## 2023-03-13 ENCOUNTER — Ambulatory Visit: Payer: Self-pay | Admitting: Family Medicine
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
## 2023-03-14 ENCOUNTER — Telehealth: Payer: Self-pay

## 2023-03-14 ENCOUNTER — Ambulatory Visit: Payer: Self-pay | Admitting: Family Medicine

## 2023-03-14 ENCOUNTER — Inpatient Hospital Stay: Payer: PPO | Attending: Oncology

## 2023-03-14 DIAGNOSIS — Z452 Encounter for adjustment and management of vascular access device: Secondary | ICD-10-CM | POA: Insufficient documentation

## 2023-03-14 DIAGNOSIS — C3411 Malignant neoplasm of upper lobe, right bronchus or lung: Secondary | ICD-10-CM | POA: Diagnosis present

## 2023-03-14 DIAGNOSIS — Z95828 Presence of other vascular implants and grafts: Secondary | ICD-10-CM

## 2023-03-14 MED ORDER — SODIUM CHLORIDE 0.9% FLUSH
10.0000 mL | Freq: Once | INTRAVENOUS | Status: AC
  Administered 2023-03-14: 10 mL via INTRAVENOUS
  Filled 2023-03-14: qty 10

## 2023-03-14 MED ORDER — HEPARIN SOD (PORK) LOCK FLUSH 100 UNIT/ML IV SOLN
500.0000 [IU] | Freq: Once | INTRAVENOUS | Status: AC
Start: 2023-03-14 — End: 2023-03-14
  Administered 2023-03-14: 500 [IU] via INTRAVENOUS
  Filled 2023-03-14: qty 5

## 2023-03-14 NOTE — Telephone Encounter (Signed)
 Noted. Plan to see tomorrow as scheduled.

## 2023-03-14 NOTE — Telephone Encounter (Signed)
 Fyi   Tried calling husband to see a urine sample could be collected. Unable to reach anyone. Pt is scheduled to see you tomorrow at 0800
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
## 2023-03-14 NOTE — Telephone Encounter (Signed)
 Copied from CRM [id number]. Topic: Clinical - Home Health Verbal Orders >> Mar 14, 2023 10:24 AM Laymon HERO wrote: Reason for CRM: [facility] [place] [phone] wanting to leave message that Patient is discharged from nursing today. Any questions you can reach out

## 2023-03-15 ENCOUNTER — Telehealth: Payer: Self-pay | Admitting: Family Medicine

## 2023-03-15 ENCOUNTER — Ambulatory Visit: Payer: PPO | Admitting: Family Medicine

## 2023-03-15 VITALS — BP 112/68 | HR 85 | Temp 97.5°F | Ht 65.0 in | Wt 141.2 lb

## 2023-03-15 DIAGNOSIS — R4182 Altered mental status, unspecified: Secondary | ICD-10-CM

## 2023-03-15 DIAGNOSIS — S065XAA Traumatic subdural hemorrhage with loss of consciousness status unknown, initial encounter: Secondary | ICD-10-CM

## 2023-03-15 DIAGNOSIS — R8290 Unspecified abnormal findings in urine: Secondary | ICD-10-CM | POA: Diagnosis not present

## 2023-03-15 DIAGNOSIS — F03A18 Unspecified dementia, mild, with other behavioral disturbance: Secondary | ICD-10-CM | POA: Diagnosis not present

## 2023-03-15 LAB — POCT URINALYSIS DIPSTICK
Glucose, UA: NEGATIVE
Nitrite, UA: NEGATIVE
Protein, UA: POSITIVE — AB
Spec Grav, UA: >=1.03 — AB (ref 1.010–1.025)
Urobilinogen, UA: 0.2 U/dL
pH, UA: 5.5 (ref 5.0–8.0)

## 2023-03-15 LAB — COMPREHENSIVE METABOLIC PANEL
ALT: 15 U/L (ref 0–35)
AST: 27 U/L (ref 0–37)
Albumin: 3.8 g/dL (ref 3.5–5.2)
Alkaline Phosphatase: 102 U/L (ref 39–117)
BUN: 18 mg/dL (ref 6–23)
CO2: 28 meq/L (ref 19–32)
Calcium: 9.4 mg/dL (ref 8.4–10.5)
Chloride: 103 meq/L (ref 96–112)
Creatinine, Ser: 0.95 mg/dL (ref 0.40–1.20)
GFR: 58.82 mL/min — ABNORMAL LOW (ref >60.00)
Glucose, Bld: 68 mg/dL — ABNORMAL LOW (ref 70–99)
Potassium: 3.6 meq/L (ref 3.5–5.1)
Sodium: 145 meq/L (ref 135–145)
Total Bilirubin: 0.5 mg/dL (ref 0.2–1.2)
Total Protein: 7.1 g/dL (ref 6.0–8.3)

## 2023-03-15 LAB — CBC WITH DIFFERENTIAL/PLATELET
Basophils Absolute: 0.1 10*3/uL (ref 0.0–0.1)
Basophils Relative: 1.2 % (ref 0.0–3.0)
Eosinophils Absolute: 0 10*3/uL (ref 0.0–0.7)
Eosinophils Relative: 0.7 % (ref 0.0–5.0)
HCT: 39.1 % (ref 36.0–46.0)
Hemoglobin: 12.6 g/dL (ref 12.0–15.0)
Lymphocytes Relative: 22.3 % (ref 12.0–46.0)
Lymphs Abs: 1.5 10*3/uL (ref 0.7–4.0)
MCHC: 32.2 g/dL (ref 30.0–36.0)
MCV: 93.6 fL (ref 78.0–100.0)
Monocytes Absolute: 0.4 10*3/uL (ref 0.1–1.0)
Monocytes Relative: 5.7 % (ref 3.0–12.0)
Neutro Abs: 4.7 10*3/uL (ref 1.4–7.7)
Neutrophils Relative %: 70.1 % (ref 43.0–77.0)
Platelets: 168 10*3/uL (ref 150.0–400.0)
RBC: 4.18 Mil/uL (ref 3.87–5.11)
RDW: 17.3 % — ABNORMAL HIGH (ref 11.5–15.5)
WBC: 6.8 10*3/uL (ref 4.0–10.5)

## 2023-03-15 LAB — URINALYSIS, MICROSCOPIC ONLY

## 2023-03-15 LAB — TSH: TSH: 0.33 u[IU]/mL — ABNORMAL LOW (ref 0.35–5.50)

## 2023-03-15 MED ORDER — CEFDINIR 300 MG PO CAPS: 300.0000 mg | ORAL_CAPSULE | Freq: Two times a day (BID) | ORAL | 0 refills | Status: DC

## 2023-03-15 NOTE — Assessment & Plan Note (Signed)
 Undetermined cause.  This could be related to her dementia.  We are checking urine studies to rule out infection.  We will get lab work as outlined.  There is always the potential this could be related to her subdural hematoma.  I will communicate with her neurosurgeon to get his input as it appears they did discuss surgical options at her appointment.  I will also have somebody reach out to the home health company to see if a social worker has been involved to help them navigate the process of potentially getting Medicaid or other resources.

## 2023-03-15 NOTE — Assessment & Plan Note (Signed)
 Chronic issue.  I have communicated with her neurosurgeon to get their input on further management of this issue.
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
## 2023-03-15 NOTE — Assessment & Plan Note (Signed)
 Chronic issue.  Suspect possible UTI is exacerbating her chronic dementia issues.  We will see if this improves with treatment of her UTI.

## 2023-03-15 NOTE — Assessment & Plan Note (Signed)
 Noted today.  Concerning for infection.  Will treat with cefdinir  300 mg twice daily.  Sending urine for culture and microscopy.
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
## 2023-03-15 NOTE — Patient Instructions (Signed)
 Nice to see. It looks like you have a UTI. Will treat you with cefdinir . I have sent a message to your neurosurgeon to get their input on when you need to follow-up.

## 2023-03-15 NOTE — Telephone Encounter (Signed)
 Can you call this patient's home health company and see if a child psychotherapist has been involved with them?  If not it would be a good idea to have a child psychotherapist see her to see if they can provide any assistance with applying for Medicaid and to see if there are other resources that could be provided.

## 2023-03-15 NOTE — Telephone Encounter (Signed)
 Thanks

## 2023-03-18 LAB — URINE CULTURE
MICRO NUMBER:: 16056880
SPECIMEN QUALITY:: ADEQUATE

## 2023-03-21 ENCOUNTER — Telehealth: Payer: Self-pay
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
## 2023-03-23 ENCOUNTER — Emergency Department
Admission: EM | Admit: 2023-03-23 | Discharge: 2023-04-02 | Disposition: A | Discharge status: Home / Self Care | Payer: PPO | Attending: Emergency Medicine | Admitting: Emergency Medicine

## 2023-03-23 ENCOUNTER — Other Ambulatory Visit: Payer: Self-pay

## 2023-03-23 ENCOUNTER — Emergency Department: Payer: PPO

## 2023-03-23 DIAGNOSIS — R45851 Suicidal ideations: Secondary | ICD-10-CM | POA: Diagnosis not present

## 2023-03-23 DIAGNOSIS — I2510 Atherosclerotic heart disease of native coronary artery without angina pectoris: Secondary | ICD-10-CM | POA: Diagnosis not present

## 2023-03-23 DIAGNOSIS — Z9151 Personal history of suicidal behavior: Secondary | ICD-10-CM | POA: Diagnosis not present

## 2023-03-23 DIAGNOSIS — J439 Emphysema, unspecified: Secondary | ICD-10-CM | POA: Diagnosis not present

## 2023-03-23 DIAGNOSIS — S065XAA Traumatic subdural hemorrhage with loss of consciousness status unknown, initial encounter: Secondary | ICD-10-CM | POA: Diagnosis not present

## 2023-03-23 DIAGNOSIS — I700 Atherosclerosis of aorta: Secondary | ICD-10-CM | POA: Insufficient documentation

## 2023-03-23 DIAGNOSIS — R0602 Shortness of breath: Secondary | ICD-10-CM | POA: Diagnosis not present

## 2023-03-23 DIAGNOSIS — K7460 Unspecified cirrhosis of liver: Secondary | ICD-10-CM | POA: Insufficient documentation

## 2023-03-23 DIAGNOSIS — X58XXXA Exposure to other specified factors, initial encounter: Secondary | ICD-10-CM | POA: Insufficient documentation

## 2023-03-23 DIAGNOSIS — F0390 Unspecified dementia without behavioral disturbance: Secondary | ICD-10-CM | POA: Insufficient documentation

## 2023-03-23 DIAGNOSIS — F32A Depression, unspecified: Secondary | ICD-10-CM | POA: Diagnosis not present

## 2023-03-23 DIAGNOSIS — S065XAD Traumatic subdural hemorrhage with loss of consciousness status unknown, subsequent encounter: Secondary | ICD-10-CM | POA: Insufficient documentation

## 2023-03-23 DIAGNOSIS — R4182 Altered mental status, unspecified: Secondary | ICD-10-CM | POA: Insufficient documentation

## 2023-03-23 DIAGNOSIS — J90 Pleural effusion, not elsewhere classified: Secondary | ICD-10-CM | POA: Diagnosis not present

## 2023-03-23 DIAGNOSIS — Z87440 Personal history of urinary (tract) infections: Secondary | ICD-10-CM | POA: Insufficient documentation

## 2023-03-23 DIAGNOSIS — F03911 Unspecified dementia, unspecified severity, with agitation: Secondary | ICD-10-CM | POA: Diagnosis not present

## 2023-03-23 DIAGNOSIS — Z79899 Other long term (current) drug therapy: Secondary | ICD-10-CM | POA: Diagnosis not present

## 2023-03-23 DIAGNOSIS — F03918 Unspecified dementia, unspecified severity, with other behavioral disturbance: Secondary | ICD-10-CM | POA: Diagnosis not present

## 2023-03-23 DIAGNOSIS — R410 Disorientation, unspecified: Secondary | ICD-10-CM | POA: Insufficient documentation

## 2023-03-23 DIAGNOSIS — F0393 Unspecified dementia, unspecified severity, with mood disturbance: Secondary | ICD-10-CM | POA: Diagnosis not present

## 2023-03-23 DIAGNOSIS — J40 Bronchitis, not specified as acute or chronic: Secondary | ICD-10-CM | POA: Diagnosis not present

## 2023-03-23 DIAGNOSIS — X58XXXD Exposure to other specified factors, subsequent encounter: Secondary | ICD-10-CM | POA: Insufficient documentation

## 2023-03-23 LAB — URINE DRUG SCREEN, QUALITATIVE (ARMC ONLY)
Amphetamines, Ur Screen: NOT DETECTED
Barbiturates, Ur Screen: NOT DETECTED
Benzodiazepine, Ur Scrn: NOT DETECTED
Cannabinoid 50 Ng, Ur ~~LOC~~: NOT DETECTED
Cocaine Metabolite,Ur ~~LOC~~: NOT DETECTED
MDMA (Ecstasy)Ur Screen: NOT DETECTED
Methadone Scn, Ur: NOT DETECTED
Opiate, Ur Screen: NOT DETECTED
Phencyclidine (PCP) Ur S: NOT DETECTED
Tricyclic, Ur Screen: NOT DETECTED

## 2023-03-23 LAB — CBC WITH DIFFERENTIAL/PLATELET
Abs Immature Granulocytes: 0.01 10*3/uL (ref 0.00–0.07)
Basophils Absolute: 0 10*3/uL (ref 0.0–0.1)
Basophils Relative: 1 %
Eosinophils Absolute: 0 10*3/uL (ref 0.0–0.5)
Eosinophils Relative: 1 %
HCT: 40.1 % (ref 36.0–46.0)
Hemoglobin: 12.8 g/dL (ref 12.0–15.0)
Immature Granulocytes: 0 %
Lymphocytes Relative: 26 %
Lymphs Abs: 1.3 10*3/uL (ref 0.7–4.0)
MCH: 30 pg (ref 26.0–34.0)
MCHC: 31.9 g/dL (ref 30.0–36.0)
MCV: 93.9 fL (ref 80.0–100.0)
Monocytes Absolute: 0.3 10*3/uL (ref 0.1–1.0)
Monocytes Relative: 7 %
Neutro Abs: 3.3 10*3/uL (ref 1.7–7.7)
Neutrophils Relative %: 65 %
Platelets: 178 10*3/uL (ref 150–400)
RBC: 4.27 MIL/uL (ref 3.87–5.11)
RDW: 15.7 % — ABNORMAL HIGH (ref 11.5–15.5)
WBC: 5 10*3/uL (ref 4.0–10.5)
nRBC: 0 % (ref 0.0–0.2)

## 2023-03-23 LAB — COMPREHENSIVE METABOLIC PANEL
ALT: 15 U/L (ref 0–44)
AST: 24 U/L (ref 15–41)
Albumin: 3.8 g/dL (ref 3.5–5.0)
Alkaline Phosphatase: 75 U/L (ref 38–126)
Anion gap: 10 (ref 5–15)
BUN: 16 mg/dL (ref 8–23)
CO2: 29 mmol/L (ref 22–32)
Calcium: 9.5 mg/dL (ref 8.9–10.3)
Chloride: 103 mmol/L (ref 98–111)
Creatinine, Ser: 0.85 mg/dL (ref 0.44–1.00)
GFR, Estimated: >60 mL/min (ref >60)
Glucose, Bld: 91 mg/dL (ref 70–99)
Potassium: 4.1 mmol/L (ref 3.5–5.1)
Sodium: 142 mmol/L (ref 135–145)
Total Bilirubin: 0.7 mg/dL (ref 0.0–1.2)
Total Protein: 7.3 g/dL (ref 6.5–8.1)

## 2023-03-23 LAB — ETHANOL: Alcohol, Ethyl (B): <10 mg/dL (ref <10)

## 2023-03-23 LAB — URINALYSIS, W/ REFLEX TO CULTURE (INFECTION SUSPECTED)
Bilirubin Urine: NEGATIVE
Glucose, UA: NEGATIVE mg/dL
Hgb urine dipstick: NEGATIVE
Ketones, ur: NEGATIVE mg/dL
Leukocytes,Ua: NEGATIVE
Nitrite: NEGATIVE
Protein, ur: NEGATIVE mg/dL
Specific Gravity, Urine: 1.02 (ref 1.005–1.030)
pH: 7 (ref 5.0–8.0)

## 2023-03-23 LAB — SALICYLATE LEVEL: Salicylate Lvl: <7 mg/dL — ABNORMAL LOW (ref 7.0–30.0)

## 2023-03-23 LAB — ACETAMINOPHEN LEVEL: Acetaminophen (Tylenol), Serum: <10 ug/mL — ABNORMAL LOW (ref 10–30)

## 2023-03-23 MED ORDER — HALOPERIDOL LACTATE 5 MG/ML IJ SOLN
3.0000 mg | Freq: Once | INTRAMUSCULAR | Status: AC
  Administered 2023-03-23: 3 mg via INTRAVENOUS
  Filled 2023-03-23: qty 1

## 2023-03-23 MED ORDER — LORAZEPAM 2 MG/ML IJ SOLN
1.0000 mg | Freq: Once | INTRAMUSCULAR | Status: AC
  Administered 2023-03-23: 1 mg via INTRAMUSCULAR

## 2023-03-23 MED ORDER — LORAZEPAM 2 MG/ML IJ SOLN
1.0000 mg | Freq: Once | INTRAMUSCULAR | Status: DC
Start: 2023-03-23 — End: 2023-03-23
  Filled 2023-03-23: qty 1

## 2023-03-23 NOTE — ED Provider Notes (Signed)
 [facility] Provider Note    Event Date/Time   First MD Initiated Contact with Patient 03/23/23 1626     (approximate)   History   behavioral evaluation and Altered Mental Status   HPI  Anne [name] is a 73 year old female with with history of dementia presenting to the emergency department for evaluation of suicidal ideation.  Patient recently treated for UTI.  Patient tells me she is having having ongoing suicidal thoughts for years.  Reports this is related to her husband not paying enough attention to her.  Reports that she has thought about killing herself with a gun, but family reports she does not have access to a gun.  Denies HI.     Physical Exam   Triage Vital Signs: ED Triage Vitals  Encounter Vitals Group     BP 03/23/23 1334 (!) 151/78     Systolic BP Percentile --      Diastolic BP Percentile --      Pulse Rate 03/23/23 1334 70     Resp 03/23/23 1334 18     Temp 03/23/23 1334 98.2 F (36.8 C)     Temp Source 03/23/23 1334 Oral     SpO2 03/23/23 1334 95 %     Weight 03/23/23 1344 141 lb 1.5 oz (64 kg)     Height 03/23/23 1344 5\' 5"  (1.651 m)     Head Circumference --      Peak Flow --      Pain Score --      Pain Loc --      Pain Education --      Exclude from Growth Chart --     Most recent vital signs: Vitals:   03/23/23 1334 03/23/23 1730  BP: (!) 151/78 (!) 157/80  Pulse: 70 66  Resp: 18   Temp: 98.2 F (36.8 C)   SpO2: 95% 96%     General: Awake, interactive  CV:  Regular rate, good peripheral perfusion.  Resp:  Unlabored respirations.  Abd:  Nondistended.  Neuro:  Symmetric facial movement, fluid speech   ED Results / Procedures / Treatments   Labs (all labs ordered are listed, but only abnormal results are displayed) Labs Reviewed  CBC WITH DIFFERENTIAL/PLATELET - Abnormal; Notable for the following components:      Result Value   RDW 15.7 (*)    All other components within normal limits  URINALYSIS,  W/ REFLEX TO CULTURE (INFECTION SUSPECTED) - Abnormal; Notable for the following components:   Color, Urine YELLOW (*)    APPearance CLOUDY (*)    Bacteria, UA RARE (*)    All other components within normal limits  ACETAMINOPHEN LEVEL - Abnormal; Notable for the following components:   Acetaminophen (Tylenol), Serum <10 (*)    All other components within normal limits  SALICYLATE LEVEL - Abnormal; Notable for the following components:   Salicylate Lvl <7.0 (*)    All other components within normal limits  COMPREHENSIVE METABOLIC PANEL  URINE DRUG SCREEN, QUALITATIVE ([facility] ONLY)  ETHANOL     EKG EKG independently reviewed interpreted by myself (ER attending) demonstrates:    RADIOLOGY Imaging independently reviewed and interpreted by myself demonstrates:  CXR without focal consolidation  PROCEDURES:  Critical Care performed: No  Procedures   MEDICATIONS ORDERED IN ED: Medications  haloperidol lactate (HALDOL) injection 3 mg (3 mg Intravenous Given 03/23/23 1915)  LORazepam (ATIVAN) injection 1 mg (1 mg Intramuscular Given 03/23/23 1955)     IMPRESSION /  MDM / ASSESSMENT AND PLAN / ED COURSE  I reviewed the triage vital signs and the nursing notes.  Differential diagnosis includes, but is not limited to, primary psychiatric disorder, worsening dementia, UTI, pneumonia  Patient's presentation is most consistent with acute presentation with potential threat to life or bodily function.  73 year old female presenting with suicidal ideation.  Stable vitals.  Labs reassuring.  Urine without evidence of infection.  X-[place] without pneumonia.  Do think it is reasonable patient to be medically cleared for psychiatric evaluation.  Psychiatry and TTS consulted.   The patient has been placed in psychiatric observation due to the need to provide a safe environment for the patient while obtaining psychiatric consultation and evaluation, as well as ongoing medical and medication management  to treat the patient's condition.  The patient has not been placed under full IVC at this time.       FINAL CLINICAL IMPRESSION(S) / ED DIAGNOSES   Final diagnoses:  Suicidal ideation     Rx / DC Orders   ED Discharge Orders     None        Note:  This document was prepared using Dragon voice recognition software and may include unintentional dictation errors.   Trinna Post, MD 03/23/23 [phone]

## 2023-03-23 NOTE — ED Notes (Signed)
 Pt did settle down for approximately 30-40 minutes after haldol--she must have pulled her IV out at some point. Ativan was ordered for pt via IV but order was changed to IM.  Medication given per order.
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
## 2023-03-23 NOTE — BH Assessment (Signed)
 Attempted to assess this patient but patient is currently sleeping due to medications administered. Psyc team to follow

## 2023-03-24 ENCOUNTER — Emergency Department: Payer: PPO

## 2023-03-24 DIAGNOSIS — R45851 Suicidal ideations: Secondary | ICD-10-CM

## 2023-03-24 MED ORDER — LEVOTHYROXINE SODIUM 112 MCG PO TABS
112.0000 ug | ORAL_TABLET | Freq: Every day | ORAL | Status: DC
  Administered 2023-03-24 – 2023-04-01 (×7): 112 ug via ORAL
  Filled 2023-03-24 (×10): qty 1

## 2023-03-24 MED ORDER — LEVETIRACETAM 500 MG PO TABS
1000.0000 mg | ORAL_TABLET | Freq: Two times a day (BID) | ORAL | Status: DC
  Administered 2023-03-24 – 2023-04-02 (×18): 1000 mg via ORAL
  Filled 2023-03-24 (×21): qty 2

## 2023-03-24 MED ORDER — SERTRALINE HCL 50 MG PO TABS
150.0000 mg | ORAL_TABLET | Freq: Every day | ORAL | Status: DC
  Administered 2023-03-24 – 2023-04-02 (×9): 150 mg via ORAL
  Filled 2023-03-24 (×5): qty 3
  Filled 2023-03-24: qty 1
  Filled 2023-03-24 (×5): qty 3

## 2023-03-24 MED ORDER — ZIPRASIDONE MESYLATE 20 MG IM SOLR
10.0000 mg | Freq: Once | INTRAMUSCULAR | Status: AC
  Administered 2023-03-24: 10 mg via INTRAMUSCULAR
  Filled 2023-03-24: qty 20

## 2023-03-24 MED ORDER — ROSUVASTATIN CALCIUM 20 MG PO TABS
20.0000 mg | ORAL_TABLET | Freq: Every day | ORAL | Status: DC
  Administered 2023-03-24 – 2023-04-02 (×9): 20 mg via ORAL
  Filled 2023-03-24 (×11): qty 1

## 2023-03-24 MED ORDER — RIVASTIGMINE TARTRATE 3 MG PO CAPS
3.0000 mg | ORAL_CAPSULE | Freq: Two times a day (BID) | ORAL | Status: DC
  Administered 2023-03-24 – 2023-04-02 (×18): 3 mg via ORAL
  Filled 2023-03-24 (×20): qty 1

## 2023-03-24 NOTE — ED Notes (Signed)
 Ambulated pt to the restroom x 1 assist with walker

## 2023-03-24 NOTE — ED Provider Notes (Signed)
 12:22 PM: Patient assessment came to visit, wants another head CT to be done because she did have a subdural on 27 January.  Will put in a CT head.  CT head: IMPRESSION:  1. A complex mixed density lobulated and septated subdural hematoma  overlying the left cerebral hemisphere has slightly decreased in  size since the prior head CT of 03/04/2023, now measuring up to 2.1  cm in thickness. Mass effect upon the underlying left cerebral  hemisphere has slightly decreased. Midline shift has decreased, now  measuring 4 mm (previously 5-6 mm).  2. No evidence of interval acute intracranial abnormality.  3. Parenchymal atrophy.    No acute bleeding, seems like subdural hematoma from end of January is improving.  She is medically cleared, pending psych and TOC.   Claybon Jabs, MD 03/24/23 [phone]

## 2023-03-24 NOTE — ED Notes (Signed)
Pt refused shower.  
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
## 2023-03-24 NOTE — ED Notes (Signed)
VOL, pend placement 

## 2023-03-24 NOTE — ED Notes (Signed)
Ambulated pt to restroom with walker

## 2023-03-24 NOTE — BH Assessment (Signed)
 Per [facility] ([place]), patient to be referred out of system.  Referral information for Psychiatric Hospitalization faxed to;    [facility] [phone] -[phone]) [phone]  Earlene Plater [phone]),  [facility] [phone] -or- [phone]),   [facility] Stabile [phone]),  [place] [phone] or [phone]),

## 2023-03-24 NOTE — ED Notes (Signed)
 Pt constantly attempting to get up from recliner chair, bed alarm in place, pt yelling that she wants to get out of here, "I'm ready to go home!".

## 2023-03-24 NOTE — ED Notes (Signed)
 Pt provided with dinner tray.

## 2023-03-24 NOTE — BH Assessment (Addendum)
 Comprehensive Clinical Assessment (CCA) Note  03/24/2023 Anne [name] [id number]  Chief Complaint:  Chief Complaint  Patient presents with   behavioral evaluation   Altered Mental Status   Visit Diagnosis:  Per Chart:  Altered Mental Status   Flowsheet Row ED from 03/23/2023 in [facility] Emergency Department at [facility] ED from 03/05/2023 in [facility] Emergency Department at [facility] ED to Hosp-Admission (Discharged) from 01/22/2023 in [facility]  C-SSRS RISK CATEGORY High Risk No Risk No Risk      The patient demonstrates the following risk factors for suicide: Chronic risk factors for suicide include: psychiatric disorder of major depression disorder, previous suicide attempts by overdose, and medical illness brain bleed . Acute risk factors for suicide include: family or marital conflict and social withdrawal/isolation. Protective factors for this patient include: positive social support, positive therapeutic relationship, responsibility to others (children, family), coping skills, and hope for the future. Considering these factors, the overall suicide risk at this point appears to be high. Patient is not appropriate for outpatient follow up.   Disposition C. Penn NP, patient meets inpatient criteria.  [facility] contacted and bed availability under review.  Disposition discussed with Lawyer.  RN to discuss with EDP.  Anne [name] is a 73 year old female who presents voluntarily to [facility] and accompanied with her daughter, Anne [name]. Pt denies SI, HI, or AVH.  Pt husband, Anne [name] reports history of depression; also, stated "she wants to die".  Pt daughter reports prior suicide attempt by overdose, in the 90"s.   Pt presents the following symptoms, anxious, aggressive, agitated, irritated, easily annoyed, blames others, angry, difficulty concentrating, overwhelmed, and confusion.  Pt was combative with staff and security.   Pt husband  reports she is sleeping eight or more hours.  Pt also reports eating two meals daily.  Pt denies drinking alcohol or using any other substance.  Pt reports smoking cigarettes occasionally.  Pt husband identifies her primary stressor as with her health, "the dementia has worsened, since she was told about the UTI; also, the brain bleeding has gotten worse".  Pt husband reports they live together alone with their two dogs and both are retired.  Pt denies a family history of mental illness; also, denies a family history of substance used.  Pt husband reports her first marriage was bad.  Pt denies any current legal problems. Pt husband reports a gun is in the house; also, reports "it is laying on the table, no one will bother it".  Pt says she is not currently receiving weekly outpatient therapy; also, reports not receiving outpatient medication management.  Pt is dressed in scrubs, alert, oriented x 3 with loud profane speech and restless motor behavior.  Eye contact is fleeting.  Pt mood is anxious and affect is anxious.  Thought process distorted.  Pt's insight is lacking and gaps.  Pt judgment is poor.  There is no indication Pt is currently responding to internal stimuli or experiencing delusional thought content.  Pt was critical and defensive throughout assessment.   CCA Screening, Triage and Referral (STR)  Patient Reported Information How did you hear about [place]? Family/Friend  What Is the Reason for Your Visit/Call Today? pt BIB by daughter for completing wellness check at daughters request.  How Long Has This Been Causing You Problems? <Week  What Do You Feel Would Help You the Most Today? Treatment for Depression or other mood problem   Have You Recently Had Any  Thoughts About Hurting Yourself? No  Are You Planning to Commit Suicide/Harm Yourself At This time? No   Flowsheet Row ED from [facility]23/20[facility] Department at [facility] ED from 03/05/2023 in [facility] Department at [facility] ED to Hosp-Admission (Discharged) from 01/22/2023 in [facility]  C-SSRS RISK CATEGORY High Risk No Risk No Risk       Have you Recently Had Thoughts About Hurting Someone Anne [name]? No  Are You Planning to Harm Someone at This Time? No  Explanation: non reported   Have You Used Any Alcohol or Drugs in the Past 24 Hours? No  How Long Ago Did You Use Drugs or Alcohol? None reported  What Did You Use and How Much? None reported  Do You Currently Have a Therapist/Psychiatrist? No  Name of Therapist/Psychiatrist:    Have You Been Recently Discharged From Any Office Practice or Programs? No  Explanation of Discharge From Practice/Program: Pt discharged from [facility] on 03/26/22.     CCA Screening Triage Referral Assessment Type of Contact: Face-to-Face  Telemedicine Service Delivery:   Is this Initial or Reassessment?   Date Telepsych consult ordered in [facility]:    Time Telepsych consult ordered in [facility]:    Location of Assessment: [facility] ED  Provider Location: [facility] ED   Collateral Involvement: Pt husband, Anne [name] particpated in assessment.   Does Patient Have a Automotive engineer Guardian? No  Legal Guardian Contact Information: -- (n/a)  Copy of Legal Guardianship Form: -- (n/a)  Legal Guardian Notified of Arrival: -- (n/a)  Legal Guardian Notified of Pending Discharge: -- (n/a)  If Minor and Not Living with Parent(s), Who has Custody? -- (n/a)  Is CPS involved or ever been involved? Never  Is APS involved or ever been involved? Never   Patient Determined To Be At Risk for Harm To Self or Others Based on Review of Patient Reported Information or Presenting Complaint? No  Method: No Plan  Availability of Means: No access or NA  Intent: Vague intent or NA  Notification Required: No need or identified person  Additional Information for Danger to Others Potential: -- (n/a)  Additional  Comments for Danger to Others Potential: Pt husband, Tihanna Goodson, participated in assessment.  Are There Guns or Other Weapons in Your Home? Yes  Types of Guns/Weapons: Pt husband reports a gun in the house; also, reports "it is laying on the table, no one has bother it". (n/a)  Are These Weapons Safely Secured?                            No (n/a)  Who Could Verify You Are Able To Have These Secured: -- (n/a)  Do You Have any Outstanding Charges, Pending Court Dates, Parole/Probation? -- (n/a)  Contacted To Inform of Risk of Harm To Self or Others: Law Enforcement    Does Patient Present under Involuntary Commitment? No    [place] of Residence: [place]   Patient Currently Receiving the Following Services: Medication Management   Determination of Need: Urgent (48 hours)   Options For Referral: Inpatient Hospitalization     CCA Biopsychosocial Patient Reported Schizophrenia/Schizoaffective Diagnosis in Past: No   Strengths: Asking questions   Mental Health Symptoms Depression:  Change in energy/activity; Difficulty Concentrating   Duration of Depressive symptoms: Duration of Depressive Symptoms: Greater than two weeks   Mania:  None   Anxiety:   Irritability; Restlessness;  Fatigue; Difficulty concentrating   Psychosis:  None   Duration of Psychotic symptoms:    Trauma:  None   Obsessions:  None   Compulsions:  None   Inattention:  None   Hyperactivity/Impulsivity:  Always on the go   Oppositional/Defiant Behaviors:  None   Emotional Irregularity:  None   Other Mood/Personality Symptoms:  Aggressive/Anxious    Mental Status Exam Appearance and self-care  Stature:  Average   Weight:  Average weight   Clothing:  Casual   Grooming:  Normal   Cosmetic use:  Age appropriate   Posture/gait:  Normal   Motor activity:  Agitated; Restless   Sensorium  Attention:  Confused   Concentration:  Preoccupied; Scattered   Orientation:  Object;  Person; Place   Recall/memory:  Defective in Short-term   Affect and Mood  Affect:  Anxious   Mood:  Angry; Anxious; Irritable; Negative; Hypomania   Relating  Eye contact:  Fleeting   Facial expression:  Anxious; Tense   Attitude toward examiner:  Critical; Defensive   Thought and Language  Speech flow: Profane; Loud   Thought content:  Persecutions; Suspicious   Preoccupation:  None   Hallucinations:  None   Organization:  Disorganized   Company secretary of Knowledge:  Fair   Intelligence:  Average   Abstraction:  Abstract   Judgement:  Poor   Reality Testing:  Unaware; Variable   Insight:  Lacking; Gaps; Shallow   Decision Making:  Confused   Social Functioning  Social Maturity:  Impulsive   Social Judgement:  Heedless   Stress  Stressors:  Relationship; Family conflict   Coping Ability:  Overwhelmed   Skill Deficits:  Self-care; Decision making   Supports:  Family     Religion: Religion/Spirituality Are You A Religious Person?:  (n/a) How Might This Affect Treatment?: did not assessed  Leisure/Recreation: Leisure / Recreation Do You Have Hobbies?: Yes Leisure and Hobbies: Pt reports she enjoys crafts  Exercise/Diet: Exercise/Diet Do You Exercise?: No Have You Gained or Lost A Significant Amount of Weight in the Past Six Months?: No Do You Follow a Special Diet?: No Do You Have Any Trouble Sleeping?: No   CCA Employment/Education Employment/Work Situation: Employment / Work Academic librarian Situation: Retired Passenger transport manager has Been Impacted by Current Illness: No Has Patient ever Been in Equities trader?: No  Education: Education Is Patient Currently Attending School?: No Last Grade Completed: 12 Did You Product manager?: No Did You Have An Individualized Education Program (IIEP): No Did You Have Any Difficulty At [employer]?: No Patient's Education Has Been Impacted by Current Illness: No   CCA Family/Childhood  History Family and Relationship History: Family history Marital status: Married Number of Years Married:  (not assed) What types of issues is patient dealing with in the relationship?: Health concerns Additional relationship information: n/a Does patient have children?: Yes How many children?: 1 How is patient's relationship with their children?: close  Childhood History:  Childhood History By whom was/is the patient raised?: Mother Did patient suffer any verbal/emotional/physical/sexual abuse as a child?: No Did patient suffer from severe childhood neglect?: No Has patient ever been sexually abused/assaulted/raped as an adolescent or adult?: No Was the patient ever a victim of a crime or a disaster?: No Witnessed domestic violence?: No Has patient been affected by domestic violence as an adult?: No       CCA Substance Use Alcohol/Drug Use: Alcohol / Drug Use Pain Medications: See MRA Prescriptions: See MRA Over the  Counter: See MRA History of alcohol / drug use?: No history of alcohol / drug abuse Longest period of sobriety (when/how long):  (n/a) Negative Consequences of Use:  (n/a) Withdrawal Symptoms:  (n/a)                         ASAM's:  Six Dimensions of Multidimensional Assessment  Dimension 1:  Acute Intoxication and/or Withdrawal Potential:   Dimension 1:  Description of individual's past and current experiences of substance use and withdrawal:  (n/a)  Dimension 2:  Biomedical Conditions and Complications:   Dimension 2:  Description of patient's biomedical conditions and  complications:  (n/a)  Dimension 3:  Emotional, Behavioral, or Cognitive Conditions and Complications:  Dimension 3:  Description of emotional, behavioral, or cognitive conditions and complications:  (n/a)  Dimension 4:  Readiness to Change:  Dimension 4:  Description of Readiness to Change criteria:  (n/a)  Dimension 5:  Relapse, Continued use, or Continued Problem Potential:   Dimension 5:  Relapse, continued use, or continued problem potential critiera description:  (n/a)  Dimension 6:  Recovery/Living Environment:  Dimension 6:  Recovery/Iiving environment criteria description:  (n/a)  ASAM Severity Score:    ASAM Recommended Level of Treatment: ASAM Recommended Level of Treatment:  (n/a)   Substance use Disorder (SUD) Substance Use Disorder (SUD)  Checklist Symptoms of Substance Use:  (n/a)  Recommendations for Services/Supports/Treatments: Recommendations for Services/Supports/Treatments Recommendations For Services/Supports/Treatments: Inpatient Hospitalization  Disposition Recommendation per psychiatric provider: We recommend inpatient psychiatric hospitalization when medically cleared. Patient is under voluntary admission status at this time; please IVC if attempts to leave hospital.   DSM5 Diagnoses: Patient Active Problem List   Diagnosis Date Noted   Altered mental status 03/15/2023   Abnormal urinalysis 03/15/2023   Weakness 02/26/2023   Acute urinary retention 02/05/2023   Seizure (HCC) 01/22/2023   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/22/2023   Tremor 11/23/2022   Urinary urgency 03/26/2022   Sore throat 03/07/2022   Seizure-like activity (HCC) 02/08/2022   Seborrheic keratosis 09/20/2021   Closed fracture of nasal bones 07/26/2021   Falls frequently 07/26/2021   Head injury 07/07/2021   Left hip pain 07/07/2021   Lipoma 07/07/2021   Slurred speech 06/07/2021   Subdural hematoma (HCC) 01/23/2021   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   Aortic atherosclerosis (HCC) 08/24/2020   Dementia (HCC) 03/14/2020   Left shoulder pain 07/22/2019   Hypothyroidism 02/16/2019   Goals of care, counseling/discussion 08/06/2018   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), in remission, 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   Skin tear of forearm without complication, initial encounter  12/06/2017   Bruising 12/06/2017   Overweight 08/05/2017   Anemia 01/23/2017   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

## 2023-03-24 NOTE — ED Notes (Signed)
 Pt assisted to restroom, able to walk with walker.

## 2023-03-24 NOTE — ED Notes (Signed)
 This tech assisted pt to bathroom with walker.
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
## 2023-03-24 NOTE — ED Notes (Signed)
 Patient assisted to bathroom.  Patient is 2 person assist even with walker.

## 2023-03-24 NOTE — ED Notes (Signed)
 Pt speaking to tele psych NP

## 2023-03-24 NOTE — ED Notes (Signed)
Pt refused snack and drink 

## 2023-03-24 NOTE — ED Notes (Signed)
 Pt here for 15 min visit, supervised by Patient Advocate. Husband expresses concern about pts previous CT head and how she had a SDH. Requesting for MD to re-scan her head to make sure the bleed isn't worse. MD made aware. Per husband pt acting her normal self today.

## 2023-03-24 NOTE — ED Notes (Signed)
 Pt continuously attempted to get out of bed without assistance, bed alarm in place with socks and fall signage present. Pt instructed to stay in bed, will continue to observe.

## 2023-03-25 ENCOUNTER — Ambulatory Visit: Payer: PPO | Admitting: Family Medicine

## 2023-03-25 ENCOUNTER — Emergency Department: Payer: PPO

## 2023-03-25 LAB — RESP PANEL BY RT-PCR (RSV, FLU A&B, COVID)  RVPGX2
Influenza A by PCR: NEGATIVE
Influenza B by PCR: NEGATIVE
Resp Syncytial Virus by PCR: NEGATIVE
SARS Coronavirus 2 by RT PCR: NEGATIVE

## 2023-03-25 LAB — D-DIMER, QUANTITATIVE: D-Dimer, Quant: 1.12 ug{FEU}/mL — ABNORMAL HIGH (ref 0.00–0.50)

## 2023-03-25 MED ORDER — HALOPERIDOL LACTATE 5 MG/ML IJ SOLN
2.5000 mg | Freq: Once | INTRAMUSCULAR | Status: AC
  Administered 2023-03-25: 2.5 mg via INTRAVENOUS
  Filled 2023-03-25: qty 1

## 2023-03-25 MED ORDER — LORAZEPAM 2 MG/ML IJ SOLN
1.0000 mg | Freq: Once | INTRAMUSCULAR | Status: AC
Start: 2023-03-25 — End: 2023-03-25
  Administered 2023-03-25: 1 mg via INTRAVENOUS
  Filled 2023-03-25: qty 1

## 2023-03-25 MED ORDER — IOHEXOL 350 MG/ML SOLN
75.0000 mL | Freq: Once | INTRAVENOUS | Status: AC | PRN
  Administered 2023-03-25: 75 mL via INTRAVENOUS

## 2023-03-25 NOTE — ED Notes (Signed)
VOL/Pending placement 

## 2023-03-25 NOTE — ED Notes (Signed)
 Patient took oxygen tubing off- oxygen saturation 95% on room air. Patient requiring 2+ assistance to walk to the bathroom. Peri-care performed by this RN. New underwear and pants placed on patient at this time due to patient urinating in underwear.

## 2023-03-25 NOTE — ED Notes (Signed)
 Patient up to bathroom with 2 person assist and walker.

## 2023-03-25 NOTE — ED Notes (Signed)
VOL  PENDING  PLACEMENT 

## 2023-03-25 NOTE — ED Notes (Addendum)
 Rounding on pt and pt states, "I can't catch my breath." Pt SpO2 88% on room air. Pt placed on 2L [place]. SpO2 improved to 94% on 2L [place]. Pt repositioned in bed to facilitate breathing.

## 2023-03-25 NOTE — ED Notes (Signed)
 Pt now 100% on 2L

## 2023-03-25 NOTE — ED Notes (Signed)
Meds given  pt to ct scan

## 2023-03-25 NOTE — ED Notes (Signed)
 Patient taken to bathroom by NT- patient still demanding that husband come back to room so she can leave this "hell hole" despite many redirections that patient's husband is not here. Patient continues to attempt to get out of bed and verbally aggressive with staff. Patient agitated that she cannot leave facility. Attempted to let patient talk to daughter on phone, but patient became more agitated and began cussing at daughter as well. MD aware and medications ordered. Patient allowed this RN to give medication in left deltoid voluntarily with no manual hold required. Patient laying in stretcher at this time, still upset with staff that we cannot let her leave

## 2023-03-25 NOTE — ED Notes (Signed)
 Patient yelling and attempting to get out of bed constantly. Patient calling this RN "bitch" and swatting at staff when staff has to help her back into bed.

## 2023-03-25 NOTE — ED Notes (Signed)
 Patient spouse at bedside for visit.

## 2023-03-25 NOTE — ED Notes (Signed)
 Brief changed and pt cleansed of incontinence.

## 2023-03-25 NOTE — ED Notes (Signed)
 Called patient's daughter [name] to help try to deescalate patient at this time.

## 2023-03-25 NOTE — ED Notes (Addendum)
 Patient oxygen 90% on room air- placed on 2L nasal cannula at this time. MD aware.Respirations even and unlabored at this time. Patient awakens to name. Reports no needs at this time- toileting and food offered- patient declines both.

## 2023-03-25 NOTE — ED Provider Notes (Signed)
 Emergency Medicine Observation Re-evaluation Note  Anne [name] is a 73 y.o. female, seen on rounds today.  Pt initially presented to the ED for complaints of behavioral evaluation and Altered Mental Status    Physical Exam  BP (!) 149/77 (BP Location: Left Arm)   Pulse 86   Temp 97.9 F (36.6 C) (Oral)   Resp 17   Ht 5\' 5"  (1.651 m)   Wt 64 kg   SpO2 93%   BMI 23.48 kg/m  Physical Exam General: nad  ED Course / MDM  EKG:   I have reviewed the labs performed to date as well as medications administered while in observation.   Plan  Current plan is for psych/toc.    Willy Eddy, MD 03/25/23 [phone]

## 2023-03-25 NOTE — ED Notes (Signed)
 Anne [name], from DSS in [address] here to visit patient. Explained patient status and patient not able to participate in assessment at this time. DSS worker stated he wanted to "lay eyes" on patient. DSS worker then goes into room and takes picture of patient while she is laying on stretcher. This RN explained that pictures cannot be taken of patients in the hospital and patient is not coherent enough at this time to give consent for this. DSS reported they needed it to "ID" patient. Charge RN aware of situation.

## 2023-03-26 ENCOUNTER — Telehealth: Payer: Self-pay

## 2023-03-26 MED ORDER — OLANZAPINE 5 MG PO TABS: 2.5000 mg | ORAL_TABLET | Freq: Every day | ORAL | Status: DC

## 2023-03-26 MED ORDER — OLANZAPINE 5 MG PO TABS
2.5000 mg | ORAL_TABLET | Freq: Once | ORAL | Status: AC
  Administered 2023-03-26: 2.5 mg via ORAL
  Filled 2023-03-26: qty 1

## 2023-03-26 MED ORDER — OLANZAPINE 10 MG IM SOLR
2.5000 mg | Freq: Once | INTRAMUSCULAR | Status: DC | PRN
  Filled 2023-03-26: qty 10

## 2023-03-26 NOTE — ED Notes (Addendum)
 Patient unable to stay above 90% without being on oxygen. Pt is not on oxygen at baseline. MD Bradler notified. Patient placed on 2L Nasal cannula

## 2023-03-26 NOTE — ED Notes (Signed)
 Pt's husband states that the patient's primary care provider notified them that the pt has a uti, and that the medication that she is currently taking will cover the infection. This RN called the pharmacy to inform them of the infection, and to have them find out the medication that she is supposed to be taking.

## 2023-03-26 NOTE — ED Notes (Signed)
 Resumed care from vet rn.  Pt sleeping.  Pt on 2 liters oxygen [place].

## 2023-03-26 NOTE — Telephone Encounter (Signed)
 Noted

## 2023-03-26 NOTE — ED Notes (Signed)
 Patient calm and cooperative, took 0600 medications with no hassle.

## 2023-03-26 NOTE — ED Notes (Signed)
Med given 

## 2023-03-26 NOTE — Telephone Encounter (Signed)
 Pt husband came into the office today because he received a letter for his wife in mail. I put him in a private room and went over her lab results. He wanted [place] to know that she is in the hospital. I did advised I would call her sometime next week to get her 6 week TSH lab appointment scheduled.

## 2023-03-26 NOTE — ED Notes (Signed)
VOL  PENDING  PLACEMENT 

## 2023-03-26 NOTE — ED Provider Notes (Signed)
 Emergency Medicine Observation Re-evaluation Note  Anne [name] is a 73 y.o. female, seen on rounds today.  Pt initially presented to the ED for complaints of behavioral evaluation and Altered Mental Status Currently, the patient is resting, voices no medical complaints.  Physical Exam  BP 137/76   Pulse 79   Temp 99.9 F (37.7 C) (Rectal)   Resp 18   Ht 5\' 5"  (1.651 m)   Wt 64 kg   SpO2 99%   BMI 23.48 kg/m  Physical Exam General: Resting in no acute distress Cardiac: No cyanosis Lungs: Equal rise and fall Psych: Not agitated  ED Course / MDM  EKG:EKG Interpretation Date/Time:  Sunday March 24 2023 20:37:55 EST Ventricular Rate:  80 PR Interval:  146 QRS Duration:  78 QT Interval:  374 QTC Calculation: 431 R Axis:   23  Text Interpretation: Normal sinus rhythm Normal ECG When compared with ECG of 25-Nov-2022 17:04, No significant change was found Confirmed by UNCONFIRMED, DOCTOR ([id number]), editor Lonell Face [phone]) on 03/25/2023 7:53:21 AM  I have reviewed the labs performed to date as well as medications administered while in observation.  Recent changes in the last 24 hours include no events overnight.  Plan  Current plan is for psychiatric disposition.    Irean Hong, MD 03/26/23 [phone]
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
## 2023-03-26 NOTE — ED Notes (Signed)
 Patient received hospital lunch tray.

## 2023-03-26 NOTE — ED Notes (Signed)
 Patient taken off of oxygen to trial her oxygen levels. Pt'S oxygen levels dropped down to 87 and were unable to come back up without being placed on 2L of oxygen. Pt currently on 2L of 02.

## 2023-03-26 NOTE — ED Notes (Addendum)
 Patient beginning to get agitated and yelling out in room about wanting to go home. NP Saucier notified via secure chat.

## 2023-03-26 NOTE — BH Assessment (Signed)
 Referral information for Psychiatric Hospitalization RE-faxed to;      [facility] [phone]   [facility] [phone]),   [facility] [phone] -or- [phone]),    [facility] St[facility] [phone]),   [place] [phone] or [phone])

## 2023-03-26 NOTE — ED Notes (Signed)
 Dinner tray provided for pt

## 2023-03-27 ENCOUNTER — Telehealth: Payer: Self-pay

## 2023-03-27 MED ORDER — OLANZAPINE 10 MG IM SOLR
2.5000 mg | Freq: Once | INTRAMUSCULAR | Status: AC
  Administered 2023-03-27: 2.5 mg via INTRAMUSCULAR
  Filled 2023-03-27: qty 10

## 2023-03-27 NOTE — ED Notes (Signed)
 Patient is attempting to get out of bed. Patient was assisted back into the bed. Bed alarm remain on and active.

## 2023-03-27 NOTE — ED Notes (Signed)
Patient's husband is at bedside.

## 2023-03-27 NOTE — Telephone Encounter (Signed)
 Verbal order was given.

## 2023-03-27 NOTE — ED Notes (Signed)
 Pt awake and up to the doorway of room looking for her family.  Pt back to bed.and informed it's the middle of the night.  Bed alarm on, siderails up x 2.

## 2023-03-27 NOTE — ED Notes (Addendum)
 Assumed care of patient. Patient is awake and laying in bed. New pulse oximeter was placed on the patients finger. Patient bed alarm is on. Patient has on non-slip socks and fall risk bracelet.

## 2023-03-27 NOTE — ED Notes (Signed)
 This tech assisted pt to bathroom in room.

## 2023-03-27 NOTE — ED Notes (Signed)
 This tech assisted pt to bathroom in room. Pt was placed in a new brief and this tech did an entire bed linen change and placed down new chuck pad. Pt is back in bed resting comfortably

## 2023-03-27 NOTE — ED Notes (Signed)
Patient was given something to drink.  

## 2023-03-27 NOTE — ED Notes (Signed)
VOL/pending placement 

## 2023-03-27 NOTE — ED Notes (Signed)
 Patient was given Coke to drink. Patient spilled coke on the floor. Spill was cleaned.

## 2023-03-27 NOTE — ED Notes (Signed)
 Pt given snack and beverage.

## 2023-03-27 NOTE — ED Notes (Signed)
VOL/Pending Placement 

## 2023-03-27 NOTE — ED Notes (Signed)
 Walked into room. Patient is asking when she is going home. Patient states she has a 75 year old daughter at home and need to go to [facility]. I reminded the patient that she is in the hospital at [facility]. Patient said ok. Marland Kitchen

## 2023-03-27 NOTE — ED Notes (Signed)
 Walked into room. Patient was up having a bowel movement. Bed alarm did not go off. Patient was assisted to the bathroom using her walker. Patient was cleaned and assisted back to her bed. Bed alarm is on and active.

## 2023-03-27 NOTE — ED Notes (Signed)
 Pt repeatedly attempting to get out of bed despite bed alarms. Has been offered toileting, snacks, entertainment, support with no improvement.

## 2023-03-27 NOTE — ED Notes (Signed)
 Patient was assisted to the bathroom using a walker. Patient needs the assistance of nursing staff to walk with her for safety.

## 2023-03-27 NOTE — BH Assessment (Signed)
 Referral information for Psychiatric Hospitalization RE-faxed to;      [facility] [phone]   [facility] [phone]),   [facility] [phone] -or- [phone]),    [facility] St[facility] [phone]),   [place] [phone] or [phone])

## 2023-03-27 NOTE — Telephone Encounter (Signed)
 Copied from CRM [id number]. Topic: Clinical - Home Health Verbal Orders >> Mar 27, 2023  3:32 PM Denese Killings wrote: Caller/Agency:  [facility] Callback Number: [phone] Service Requested: MSW Evaluation Frequency: n/a  Any new concerns about the patient? No

## 2023-03-27 NOTE — ED Provider Notes (Signed)
 Emergency Medicine Observation Re-evaluation Note  Anne [name] is a 73 y.o. female, seen on rounds today.  Pt initially presented to the ED for complaints of behavioral evaluation and Altered Mental Status Currently, the patient is resting, voices no medical complaints.  Physical Exam  BP 109/72 (BP Location: Right Arm)   Pulse 86   Temp 98.6 F (37 C) (Oral)   Resp 15   Ht 5\' 5"  (1.651 m)   Wt 64 kg   SpO2 94%   BMI 23.48 kg/m  Physical Exam General: Resting in no acute distress Cardiac: No cyanosis Lungs: Equal rise and fall Psych: Not agitated  ED Course / MDM  EKG:EKG Interpretation Date/Time:  Sunday March 24 2023 20:37:55 EST Ventricular Rate:  80 PR Interval:  146 QRS Duration:  78 QT Interval:  374 QTC Calculation: 431 R Axis:   23  Text Interpretation: Normal sinus rhythm Normal ECG When compared with ECG of 25-Nov-2022 17:04, No significant change was found Confirmed by UNCONFIRMED, DOCTOR ([id number]), editor Lonell Face [phone]) on 03/25/2023 7:53:21 AM  I have reviewed the labs performed to date as well as medications administered while in observation.  Recent changes in the last 24 hours include no events overnight.  Plan  Current plan is for psychiatric disposition.    Irean Hong, MD 03/27/23 [phone]

## 2023-03-27 NOTE — ED Notes (Signed)
Patient was given breakfast tray.

## 2023-03-27 NOTE — ED Notes (Signed)
 Patient was placed into a hospital bed. Bed alarm is on. Patient was also assisted to the bathroom to void. Patient walks with the assistance of 1 and with her walker.
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
## 2023-03-27 NOTE — ED Notes (Signed)
 Pt sitting up eating dinner. This tech is sitting 1:1 with pt for pt safety.

## 2023-03-27 NOTE — ED Notes (Addendum)
 Pt continuously yelling out to wall talking to family members when in reality nobody is there. RN notified. This tech will continue to sit 1:1 for pt safety.

## 2023-03-28 NOTE — ED Notes (Signed)
 Pt Refused Psychologist, forensic

## 2023-03-28 NOTE — ED Provider Notes (Signed)
 Emergency Medicine Observation Re-evaluation Note  Physical Exam   BP 128/79   Pulse 80   Temp 97.8 F (36.6 C) (Axillary)   Resp 18   Ht 5\' 5"  (1.651 m)   Wt 64 kg   SpO2 94%   BMI 23.48 kg/m   Patient appears in no acute distress.  ED Course / MDM   No reported events during my shift at the time of this note.   Pt is awaiting dispo from consultants   Pilar Jarvis MD    Pilar Jarvis, MD 03/28/23 [phone]

## 2023-03-28 NOTE — ED Notes (Signed)
Vol /pending placement 

## 2023-03-28 NOTE — ED Notes (Signed)
VOL  PENDING  PLACEMENT 

## 2023-03-28 NOTE — ED Notes (Signed)
 Hospital meal provided, pt tolerated w/o complaints.  Waste discarded appropriately.

## 2023-03-28 NOTE — BH Assessment (Signed)
 TTS continues to look for psych inpatient bed but is unable to secure one at this time. She has been denied due to the dementia dx.

## 2023-03-29 ENCOUNTER — Telehealth: Payer: Self-pay

## 2023-03-29 DIAGNOSIS — F03918 Unspecified dementia, unspecified severity, with other behavioral disturbance: Secondary | ICD-10-CM

## 2023-03-29 NOTE — BH Assessment (Signed)
 Referral information for Psychiatric Hospitalization RE-faxed to;      [facility] [phone]   [facility] [phone]),   [facility] [phone] -or- [phone]),    [facility] St[facility] [phone]),   [place] [phone] or [phone])

## 2023-03-29 NOTE — ED Notes (Signed)
 Hospital meal provided.  100% consumed, pt tolerated w/o complaints.  Waste discarded appropriately.

## 2023-03-29 NOTE — ED Provider Notes (Signed)
 Emergency Medicine Observation Re-evaluation Note  Physical Exam   BP 117/70   Pulse 80   Temp 97.8 F (36.6 C) (Oral)   Resp 17   Ht 5\' 5"  (1.651 m)   Wt 64 kg   SpO2 92%   BMI 23.48 kg/m   Patient appears in no acute distress.  ED Course / MDM   No reported events during my shift at the time of this note.   Pt is awaiting dispo from consultants   Pilar Jarvis MD    Pilar Jarvis, MD 03/29/23 [phone]

## 2023-03-29 NOTE — Telephone Encounter (Signed)
 Forms faxed

## 2023-03-29 NOTE — Telephone Encounter (Signed)
 Received home health orders from [facility]. Placed in folder for review and signatures

## 2023-03-29 NOTE — Telephone Encounter (Signed)
 Signed and placed in signed basket.
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
## 2023-03-30 MED ORDER — OLANZAPINE 10 MG IM SOLR
2.5000 mg | Freq: Once | INTRAMUSCULAR | Status: AC
  Administered 2023-03-30: 2.5 mg via INTRAMUSCULAR
  Filled 2023-03-30: qty 10

## 2023-03-30 NOTE — ED Provider Notes (Signed)
 Emergency Medicine Observation Re-evaluation Note  Anne [name] is a 73 y.o. female, seen on rounds today.  Pt initially presented to the ED for complaints of behavioral evaluation and Altered Mental Status Currently, the patient is resting, voices no medical complaints.  Physical Exam  BP (!) 109/96   Pulse 79   Temp 99.1 F (37.3 C) (Oral)   Resp 19   Ht 5\' 5"  (1.651 m)   Wt 64 kg   SpO2 95%   BMI 23.48 kg/m  Physical Exam General: Resting in no acute distress Cardiac: No cyanosis Lungs: Equal rise and fall Psych: Not agitated  ED Course / MDM  EKG:EKG Interpretation Date/Time:  Sunday March 24 2023 20:37:55 EST Ventricular Rate:  80 PR Interval:  146 QRS Duration:  78 QT Interval:  374 QTC Calculation: 431 R Axis:   23  Text Interpretation: Normal sinus rhythm Normal ECG When compared with ECG of 25-Nov-2022 17:04, No significant change was found Confirmed by UNCONFIRMED, DOCTOR ([id number]), editor Lonell Face [phone]) on 03/25/2023 7:53:21 AM  I have reviewed the labs performed to date as well as medications administered while in observation.  Recent changes in the last 24 hours include patient was agitated, screaming outside her room unable to be verbally redirected.  Required IM calming agents which was given and with good effect..  Plan  Current plan is for psychiatric disposition.    Irean Hong, MD 03/30/23 [phone]

## 2023-03-30 NOTE — ED Notes (Signed)
 This NT provided pt with pm snack.

## 2023-03-30 NOTE — ED Notes (Signed)
 Pt out of room wanting to go through the ems bay. Writer and RN explained she is not allowed to walk down the hallway. Pt became verbally aggressive and refusing to go back to room. Writer and RN used starr technique to bring pt back to room. Door was close and pt started hit door with walker.

## 2023-03-30 NOTE — ED Notes (Signed)
 Changed pt brief and changed scrub pants.

## 2023-03-30 NOTE — BH Assessment (Signed)
 TS continues to look for psych inpatient bed but is unable to secure one at this time.

## 2023-03-30 NOTE — ED Notes (Signed)
 Pt spilled her dinner on herself and bed. Changed bed sheet again and changed into clean scrubs.

## 2023-03-30 NOTE — ED Notes (Signed)
Vol /pending placement 

## 2023-03-30 NOTE — ED Notes (Signed)
 Pt provided with dinner tray.

## 2023-03-30 NOTE — ED Notes (Signed)
 Pt stated she wants to see her husband, Clinical research associate explained that it is 12 at night. Pt yelling " I want to see my husband and I want to see him now you bitch"

## 2023-03-30 NOTE — ED Notes (Signed)
Patient is vol pending placement 

## 2023-03-31 MED ORDER — LORAZEPAM 2 MG/ML IJ SOLN
2.0000 mg | Freq: Once | INTRAMUSCULAR | Status: AC
  Administered 2023-03-31: 2 mg via INTRAMUSCULAR
  Filled 2023-03-31: qty 1

## 2023-03-31 NOTE — ED Provider Notes (Signed)
 Emergency Medicine Observation Re-evaluation Note  RHONA FUSILIER is a 73 y.o. female, seen on rounds today.  Pt initially presented to the ED for complaints of behavioral evaluation and Altered Mental Status Currently, the patient is resting, voices no medical complaints.  Physical Exam  BP 109/75 (BP Location: Right Arm)   Pulse 64   Temp 97.6 F (36.4 C) (Oral)   Resp 18   Ht 5\' 5"  (1.651 m)   Wt 64 kg   SpO2 97%   BMI 23.48 kg/m  Physical Exam General: Resting in no acute distress Cardiac: No cyanosis Lungs: Equal rise and fall Psych: Not agitated  ED Course / MDM  EKG:EKG Interpretation Date/Time:  Sunday March 24 2023 20:37:55 EST Ventricular Rate:  80 PR Interval:  146 QRS Duration:  78 QT Interval:  374 QTC Calculation: 431 R Axis:   23  Text Interpretation: Normal sinus rhythm Normal ECG When compared with ECG of 25-Nov-2022 17:04, No significant change was found Confirmed by UNCONFIRMED, DOCTOR ([id number]), editor Lonell Face [phone]) on 03/25/2023 7:53:21 AM  I have reviewed the labs performed to date as well as medications administered while in observation.  Recent changes in the last 24 hours include no events overnight.  Plan  Current plan is for psychiatric disposition.    Irean Hong, MD 03/31/23 [phone]

## 2023-03-31 NOTE — ED Notes (Signed)
 Spouse at bedside

## 2023-03-31 NOTE — ED Notes (Signed)
VOL  PENDING  PLACEMENT 

## 2023-03-31 NOTE — ED Notes (Signed)
 Pt given pm snack and beverage.
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
## 2023-03-31 NOTE — ED Notes (Signed)
 Pt is sleeping at this time. I will reassess vitals when she awakes to reduce stimuli.

## 2023-03-31 NOTE — ED Notes (Signed)
 VOL  PENDING  TOC  PLACEMENT

## 2023-03-31 NOTE — ED Notes (Signed)
 Area cleaned of trash.

## 2023-03-31 NOTE — ED Notes (Signed)
 Pt asleep at this time, no distress noted, will continue to allow pt to rest, will give synthroid once she wakes up.  Will inform oncoming nurse pt will need dose of synthroid.

## 2023-03-31 NOTE — ED Notes (Signed)
 Pt provided with lunch tray.

## 2023-03-31 NOTE — ED Notes (Signed)
 Breakfast tray provided.

## 2023-03-31 NOTE — Consult Note (Signed)
  Aggression reported by staff 03/30/23. Patient previously recommended for inpatient psychiatry. She is awaiting bed placement. Recommend reassessment to observe for continued aggression, next day.

## 2023-04-01 ENCOUNTER — Telehealth: Payer: Self-pay

## 2023-04-01 ENCOUNTER — Ambulatory Visit: Admission: RE | Admit: 2023-04-01 | Payer: PPO | Source: Ambulatory Visit

## 2023-04-01 DIAGNOSIS — F03918 Unspecified dementia, unspecified severity, with other behavioral disturbance: Secondary | ICD-10-CM | POA: Diagnosis not present

## 2023-04-01 MED ORDER — OLANZAPINE 5 MG PO TBDP
10.0000 mg | ORAL_TABLET | Freq: Once | ORAL | Status: AC
  Administered 2023-04-01: 10 mg via ORAL
  Filled 2023-04-01: qty 2

## 2023-04-01 NOTE — Telephone Encounter (Addendum)
 Patient husband refused to take number for patient experience after I explained Dr. Birdie Sons cannot change the orders in the hospital.I explained the appropriate place to report is Patient Experience. Patient experience number is [phone]

## 2023-04-01 NOTE — Telephone Encounter (Signed)
 Spoke with patient's husband. He states patient is in the ER in isolation. Patient had an UTI and says it sent her Dementia into a terrible state. Patient husband says the daughter of the patient convinced her to go to the ER last Saturday, a week ago. Patient husband says the patient threatened to kill herself. Patient is only allowed one visitor for 15 minutes per day. He is says their plan is to release her into a facility, but he has not heard any updates regarding the patient. Patient says they are keep the patient sedated and wearing diapers and not allowing her go to the bathroom. Husband is concerned as to how the patient is supposed to heal, if that's all the hospital has her doing is laying around in a hospital bed. Please advise?
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
## 2023-04-01 NOTE — Telephone Encounter (Signed)
 Can you please call him and see what his concerns are about her care?

## 2023-04-01 NOTE — ED Notes (Signed)
 Pt received snack from previous sitter

## 2023-04-01 NOTE — Telephone Encounter (Signed)
 Noted. Given that she is in the hospital he needs to reach out to patient experience to voice his concerns so they can look in to this.

## 2023-04-01 NOTE — Telephone Encounter (Signed)
 Copied from CRM [id number]. Topic: Clinical - Medical Advice >> Apr 01, 2023 10:42 AM Kathryne Eriksson wrote: Reason for CRM: Urgently Requesting A Call Back >> Apr 01, 2023 10:47 AM Kathryne Eriksson wrote: Patient's husband is urgently requesting a call back. States the patient is currently admitted at [facility], was admitted last Saturday and he doesn't feel as though she's receiving proper treatment and is seeking medical advice. Husband's call back number is [phone] (Cell)

## 2023-04-01 NOTE — ED Notes (Signed)
 Lunch provided.

## 2023-04-01 NOTE — BH Assessment (Signed)
 Referral information for Psychiatric Hospitalization RE-faxed to;      [facility] [phone]   [facility] [phone]),   [facility] [phone] -or- [phone]),    [facility] St[facility] [phone]),   [place] [phone] or [phone])

## 2023-04-01 NOTE — ED Notes (Signed)
VOL  PENDING  PLACEMENT 

## 2023-04-01 NOTE — ED Provider Notes (Signed)
 Emergency Medicine Observation Re-evaluation Note  Anne [name] is a 73 y.o. female, seen on rounds today.  Pt initially presented to the ED for complaints of behavioral evaluation and Altered Mental Status Currently, the patient is resting.  Physical Exam  BP 116/84 (BP Location: Right Arm)   Pulse 83   Temp 98.2 F (36.8 C) (Oral)   Resp 18   Ht 1.651 m (5\' 5" )   Wt 64 kg   SpO2 91%   BMI 23.48 kg/m  Physical Exam Gen:  No acute distress Resp:  Breathing easily and comfortably, no accessory muscle usage Neuro:  Moving all four extremities, no gross focal neuro deficits Psych:  Resting currently, calm when awake  ED Course / MDM  EKG:EKG Interpretation Date/Time:  Sunday March 24 2023 20:37:55 EST Ventricular Rate:  80 PR Interval:  146 QRS Duration:  78 QT Interval:  374 QTC Calculation: 431 R Axis:   23  Text Interpretation: Normal sinus rhythm Normal ECG When compared with ECG of 25-Nov-2022 17:04, No significant change was found Confirmed by UNCONFIRMED, DOCTOR ([id number]), editor Lonell Face [phone]) on 03/25/2023 7:53:21 AM  I have reviewed the labs performed to date as well as medications administered while in observation.  Recent changes in the last 24 hours include no significant changes.  Plan  Current plan is for psychiatric disposition.    Loleta Rose, MD 04/01/23 [phone]

## 2023-04-01 NOTE — ED Notes (Signed)
Vol /pending placement 

## 2023-04-01 NOTE — TOC CM/SW Note (Signed)
 CSW acknowledges consult for SNF placement. Current plan is for inpatient psych placement. CSW will complete consult. Please consult again if any TOC needs arise.  Charlynn Court, CSW [phone]

## 2023-04-02 DIAGNOSIS — R45851 Suicidal ideations: Secondary | ICD-10-CM | POA: Diagnosis not present

## 2023-04-02 MED ORDER — QUETIAPINE FUMARATE 50 MG PO TABS
50.0000 mg | ORAL_TABLET | Freq: Every day | ORAL | 0 refills | Status: DC
Start: 2023-04-02 — End: 2023-05-29

## 2023-04-02 MED ORDER — QUETIAPINE FUMARATE 25 MG PO TABS
50.0000 mg | ORAL_TABLET | ORAL | Status: DC | PRN
Start: 2023-04-02 — End: 2023-04-02

## 2023-04-02 MED ORDER — OLANZAPINE 5 MG PO TBDP
10.0000 mg | ORAL_TABLET | Freq: Once | ORAL | Status: DC
  Filled 2023-04-02: qty 2

## 2023-04-02 MED ORDER — QUETIAPINE FUMARATE 50 MG PO TABS: 50.0000 mg | ORAL_TABLET | Freq: Every day | ORAL | 0 refills | Status: DC

## 2023-04-02 MED ORDER — QUETIAPINE FUMARATE 25 MG PO TABS: 50.0000 mg | ORAL_TABLET | Freq: Every day | ORAL | Status: DC

## 2023-04-02 MED ORDER — OLANZAPINE 10 MG IM SOLR
5.0000 mg | Freq: Once | INTRAMUSCULAR | Status: AC
  Administered 2023-04-02: 5 mg via INTRAMUSCULAR
  Filled 2023-04-02: qty 10

## 2023-04-02 NOTE — ED Notes (Addendum)
 Pt alert, cooperative, appropriate for discharge. Parent/legal guardian at bedside of patient, voices understanding of discharge instructions and appropriate follow up if needed. Pt in NAD. Safe for discharge. Returned all belongings to husband.
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
## 2023-04-02 NOTE — ED Notes (Addendum)
 Pt had a witnessed assisted fall while ambulating to restroom with [place], NT. Pt did not hit her head per NT. NT said one of the wheels on the walker got stuck and pt fell as a result. Pt has a skin tear on her R forearm.

## 2023-04-02 NOTE — ED Notes (Signed)
 Breakfast provided.

## 2023-04-02 NOTE — BH Assessment (Signed)
 Facilities have been declining patient due to patient's dementia  Referral information for Psychiatric Hospitalization RE-faxed to;      [facility] [phone]   [facility] [phone]),   [facility] [phone] -or- [phone]),    [facility] [phone]),   [place] [phone] or [phone])

## 2023-04-02 NOTE — ED Provider Notes (Signed)
-----------------------------------------   12:22 PM on 04/02/2023 -----------------------------------------   Blood pressure 127/71, pulse 73, temperature 97.7 F (36.5 C), temperature source Oral, resp. rate 16, height 5\' 5"  (1.651 m), weight 64 kg, SpO2 98%.  The patient is calm and cooperative at this time.  There have been no acute events since the last update.  Patient evaluated by psychiatry team in the morning again today.  They stated no more psychiatric needs at this hospital at all at this time.  Husband has agreed to take her home.  IVC rescinded.  Patient discharged home with husband.   Janith Lima, MD 04/02/23 [phone]

## 2023-04-02 NOTE — ED Notes (Signed)
 Assisted pt to restroom with walker. As pt & I approached the restroom the right side of the walker was not rolling & hence pt falling. The walker tripped pt, but I was able to reach up under pts right side as pt was falling down & stopped pt from hitting anything as we went down. As pt went down with me to the floor pt scrapped her rt forearm, but I am not for sure on what. I am assuming the walker since it was the only thing on that side at the time. As pt was on the floor, pt was able to sit in a criss cross position on the floor ready to get up. I asked pt to sit there as help came to make sure pt was well enough to get up. Assistance came & the pt was still able to use the restroom as well as change clothes. Pt stated they felt well & the only issue was the skin tear on her right forearm.

## 2023-04-02 NOTE — ED Notes (Signed)
 Jolyne Loa, pts daughter, called at this moment by this RN. This RN told [name] about pts assisted fall and gave her an update on pt. The daughter did not have any questions after.
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
## 2023-04-02 NOTE — ED Notes (Signed)
 New RN called to attempt to administer IM medication without previous staff present. Successful IM admin. without need to retrain or manual hold pt.

## 2023-04-02 NOTE — Discharge Instructions (Signed)
 Please continue to follow-up with your primary care provider and psychiatric teams outpatient.

## 2023-04-02 NOTE — ED Notes (Signed)
 ivc by MD Jodie Echevaria.
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
## 2023-04-02 NOTE — Telephone Encounter (Signed)
 Noted.

## 2023-04-02 NOTE — ED Notes (Signed)
 Anne [name] Spouse Emergency Contact [phone] [address] [name] [address]  Contacted to inform pt ready for discharge.
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
## 2023-04-02 NOTE — ED Notes (Signed)
IVC PAPERS  RESCINDED PER  J  LEE  NP  INFORMED  ALLY  RN

## 2023-04-03 ENCOUNTER — Telehealth: Payer: Self-pay

## 2023-04-03 NOTE — Transitions of Care (Post Inpatient/ED Visit) (Signed)
   04/03/2023  Name: Anne [name] MRN: [id number] DOB: [birth year]  Today's TOC FU Call Status: Today's TOC FU Call Status:: Unsuccessful Call (1st Attempt) Unsuccessful Call (1st Attempt) Date: 04/03/23  Attempted to reach the patient regarding the most recent Inpatient/ED visit.  Follow Up Plan: Additional outreach attempts will be made to reach the patient to complete the Transitions of Care (Post Inpatient/ED visit) call.   Signature Agnes Lawrence, CMA (AAMA)  [facility] [phone]

## 2023-04-04 ENCOUNTER — Encounter: Payer: Self-pay | Admitting: Emergency Medicine

## 2023-04-04 ENCOUNTER — Encounter: Payer: Self-pay | Admitting: Nurse Practitioner

## 2023-04-04 ENCOUNTER — Emergency Department
Admission: EM | Admit: 2023-04-04 | Discharge: 2023-04-15 | Disposition: A | Discharge status: Home / Self Care | Payer: PPO | Attending: Emergency Medicine | Admitting: Emergency Medicine

## 2023-04-04 ENCOUNTER — Telehealth: Payer: Self-pay | Admitting: *Deleted

## 2023-04-04 ENCOUNTER — Other Ambulatory Visit: Payer: Self-pay

## 2023-04-04 ENCOUNTER — Ambulatory Visit (INDEPENDENT_AMBULATORY_CARE_PROVIDER_SITE_OTHER): Payer: PPO | Admitting: Nurse Practitioner

## 2023-04-04 VITALS — BP 124/82 | HR 78 | Temp 97.9°F | Ht 65.0 in | Wt 138.2 lb

## 2023-04-04 DIAGNOSIS — Z79899 Other long term (current) drug therapy: Secondary | ICD-10-CM | POA: Insufficient documentation

## 2023-04-04 DIAGNOSIS — I10 Essential (primary) hypertension: Secondary | ICD-10-CM | POA: Insufficient documentation

## 2023-04-04 DIAGNOSIS — R451 Restlessness and agitation: Secondary | ICD-10-CM | POA: Diagnosis present

## 2023-04-04 DIAGNOSIS — Z9181 History of falling: Secondary | ICD-10-CM | POA: Insufficient documentation

## 2023-04-04 DIAGNOSIS — R2689 Other abnormalities of gait and mobility: Secondary | ICD-10-CM | POA: Diagnosis not present

## 2023-04-04 DIAGNOSIS — S065XAD Traumatic subdural hemorrhage with loss of consciousness status unknown, subsequent encounter: Secondary | ICD-10-CM | POA: Diagnosis not present

## 2023-04-04 DIAGNOSIS — Z85118 Personal history of other malignant neoplasm of bronchus and lung: Secondary | ICD-10-CM | POA: Diagnosis not present

## 2023-04-04 DIAGNOSIS — F03911 Unspecified dementia, unspecified severity, with agitation: Secondary | ICD-10-CM

## 2023-04-04 DIAGNOSIS — F03918 Unspecified dementia, unspecified severity, with other behavioral disturbance: Secondary | ICD-10-CM | POA: Diagnosis not present

## 2023-04-04 DIAGNOSIS — Z87891 Personal history of nicotine dependence: Secondary | ICD-10-CM | POA: Insufficient documentation

## 2023-04-04 LAB — CBC
HCT: 40 % (ref 36.0–46.0)
Hemoglobin: 12.5 g/dL (ref 12.0–15.0)
MCH: 29.5 pg (ref 26.0–34.0)
MCHC: 31.3 g/dL (ref 30.0–36.0)
MCV: 94.3 fL (ref 80.0–100.0)
Platelets: 167 10*3/uL (ref 150–400)
RBC: 4.24 MIL/uL (ref 3.87–5.11)
RDW: 14.9 % (ref 11.5–15.5)
WBC: 6.2 10*3/uL (ref 4.0–10.5)
nRBC: 0 % (ref 0.0–0.2)

## 2023-04-04 LAB — COMPREHENSIVE METABOLIC PANEL
ALT: 18 U/L (ref 0–44)
AST: 30 U/L (ref 15–41)
Albumin: 3.8 g/dL (ref 3.5–5.0)
Alkaline Phosphatase: 78 U/L (ref 38–126)
Anion gap: 11 (ref 5–15)
BUN: 25 mg/dL — ABNORMAL HIGH (ref 8–23)
CO2: 27 mmol/L (ref 22–32)
Calcium: 9.4 mg/dL (ref 8.9–10.3)
Chloride: 103 mmol/L (ref 98–111)
Creatinine, Ser: 0.92 mg/dL (ref 0.44–1.00)
GFR, Estimated: >60 mL/min (ref >60)
Glucose, Bld: 118 mg/dL — ABNORMAL HIGH (ref 70–99)
Potassium: 3.4 mmol/L — ABNORMAL LOW (ref 3.5–5.1)
Sodium: 141 mmol/L (ref 135–145)
Total Bilirubin: 0.6 mg/dL (ref 0.0–1.2)
Total Protein: 7.3 g/dL (ref 6.5–8.1)

## 2023-04-04 LAB — ETHANOL: Alcohol, Ethyl (B): <10 mg/dL (ref <10)

## 2023-04-04 LAB — SALICYLATE LEVEL: Salicylate Lvl: <7 mg/dL — ABNORMAL LOW (ref 7.0–30.0)

## 2023-04-04 LAB — ACETAMINOPHEN LEVEL: Acetaminophen (Tylenol), Serum: <10 ug/mL — ABNORMAL LOW (ref 10–30)

## 2023-04-04 MED ORDER — MEMANTINE HCL 5 MG PO TABS
10.0000 mg | ORAL_TABLET | Freq: Two times a day (BID) | ORAL | Status: DC
  Administered 2023-04-05 – 2023-04-15 (×22): 10 mg via ORAL
  Filled 2023-04-04 (×22): qty 2

## 2023-04-04 MED ORDER — RIVASTIGMINE TARTRATE 3 MG PO CAPS
3.0000 mg | ORAL_CAPSULE | Freq: Two times a day (BID) | ORAL | Status: DC
  Administered 2023-04-05 – 2023-04-15 (×21): 3 mg via ORAL
  Filled 2023-04-04 (×23): qty 1

## 2023-04-04 MED ORDER — QUETIAPINE FUMARATE 25 MG PO TABS
50.0000 mg | ORAL_TABLET | Freq: Every day | ORAL | Status: DC
  Administered 2023-04-05 – 2023-04-14 (×11): 50 mg via ORAL
  Filled 2023-04-04 (×11): qty 2

## 2023-04-04 MED ORDER — ROSUVASTATIN CALCIUM 20 MG PO TABS
20.0000 mg | ORAL_TABLET | Freq: Every day | ORAL | Status: DC
  Administered 2023-04-05 – 2023-04-15 (×11): 20 mg via ORAL
  Filled 2023-04-04 (×11): qty 1

## 2023-04-04 MED ORDER — LEVETIRACETAM 500 MG PO TABS
1000.0000 mg | ORAL_TABLET | Freq: Two times a day (BID) | ORAL | Status: DC
  Administered 2023-04-05 – 2023-04-15 (×22): 1000 mg via ORAL
  Filled 2023-04-04 (×21): qty 2

## 2023-04-04 MED ORDER — LEVOTHYROXINE SODIUM 112 MCG PO TABS
112.0000 ug | ORAL_TABLET | Freq: Every day | ORAL | Status: DC
  Administered 2023-04-05 – 2023-04-15 (×9): 112 ug via ORAL
  Filled 2023-04-04 (×11): qty 1

## 2023-04-04 MED ORDER — SERTRALINE HCL 50 MG PO TABS
150.0000 mg | ORAL_TABLET | Freq: Every day | ORAL | Status: DC
  Administered 2023-04-05 – 2023-04-15 (×11): 150 mg via ORAL
  Filled 2023-04-04 (×11): qty 3

## 2023-04-04 NOTE — Consult Note (Incomplete)
 [facility] Consult Note  Patient Name: Anne [name] MRN: [id number] DOB: June 02, 1950 DATE OF Consult: 04/04/2023  PRIMARY PSYCHIATRIC DIAGNOSES  1.  *** 2.  *** 3.  ***  RECOMMENDATIONS  {Recommendations:304550007::"Medication recommendations: ***","Non-Medication/therapeutic recommendations: ***","Communication: Treatment team members (and family members if applicable) who were involved in treatment/care discussions and planning, and with whom we spoke or engaged with via secure text/chat, include the following: ***"}  Thank you for involving [place] in the care of this patient. If you have any additional questions or concerns, please call [phone] and ask for me or the provider on-call.  TELEPSYCHIATRY ATTESTATION & CONSENT  As the provider for this telehealth consult, I attest that I verified the patient's identity using two separate identifiers, introduced myself to the patient, provided my credentials, disclosed my location, and performed this encounter via a HIPAA-compliant, real-time, face-to-face, two-way, interactive audio and video platform and with the full consent and agreement of the patient (or guardian as applicable.)  Patient physical location: ED in [facility]. Telehealth provider physical location: home office in state of [name]  Video start time: 2239 [facility] Time) Video end time: 2249 (Central Time)  IDENTIFYING DATA  Anne [name] is a 73 y.o. year-old female for whom a psychiatric consultation has been ordered by the primary provider. The patient was identified using two separate identifiers.  CHIEF COMPLAINT/REASON FOR CONSULT  Dementia   HISTORY OF PRESENT ILLNESS (HPI)  The patient is a 73yo female who presented to the emergency department with family for psychiatric evaluation. Patient with history of dementia. Per report, patient was at an appointment this morning when she refused to leave with her husband. APS is involved,  per family, and they informed daughter to bring patient to the ED for evaluation. No SI/HI.  Upon interview, patient is calm, cooperative. She is difficult of hearing so staff assist with evaluation. No agitation or aggressive behaviors observed. She does get frustrated throughout assessment when she has trouble hearing the assessment questions and if she is unable to remember information. She is alert and oriented to person, DOB, month (February), year (2025), and location [facility]). She does not know why she is in the hospital tonight. Believes she came in for "surgery" on her "female parts".   Patient is unable to recall events leading to ED visit. She does not remember where she was prior to coming to the hospital. Patient states she lives with her husband. States things at home are "fine". Patient denies recent agitation or irritability at home. She denies recent depression, sadness, and anxiety. She feels she is sleeping and eating well. She denies suicidal and homicidal ideations. She denies hallucinations. She does not appear to be responding to internal stimuli, no thought blocking noted. No delusions apparent otherwise. Thought process is confused, she is a poor historian.     PAST PSYCHIATRIC HISTORY  *** Otherwise as per HPI above.  PAST MEDICAL HISTORY  Past Medical History:  Diagnosis Date  . Arthritis   . Cirrhosis (HCC)   . Dementia (HCC)   . Depression   . GERD (gastroesophageal reflux disease)   . Hyperlipidemia   . Hypertension   . Lung cancer (HCC)   . 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 Metastatic bone cancer   . Seizures (HCC)   . Subdural hematoma [facility])    ***  HOME MEDICATIONS  Facility Ordered Medications  Medication  . denosumab (XGEVA) injection 120 mg  .  heparin lock flush 100 UNIT/ML injection   PTA Medications  Medication Sig  . Multiple Vitamin (MULTIVITAMIN) tablet Take 1 tablet by mouth daily.  .  rosuvastatin (CRESTOR) 20 MG tablet Take 1 tablet (20 mg total) by mouth daily.  . memantine (NAMENDA) 10 MG tablet Take 1 tablet (10 mg total) by mouth 2 (two) times daily.  . rivastigmine (EXELON) 3 MG capsule Take 1 capsule (3 mg total) by mouth 2 (two) times daily.  Marland Kitchen acetaminophen (TYLENOL) 500 MG tablet Take 1,000 mg by mouth every 6 (six) hours as needed for moderate pain (pain score 4-6).  Marland Kitchen levothyroxine (SYNTHROID) 112 MCG tablet Take 1 tablet (112 mcg total) by mouth daily at 6 (six) AM.  . sertraline (ZOLOFT) 100 MG tablet TAKE 1 & 1/2 (ONE & ONE-HALF) TABLETS BY MOUTH ONCE DAILY  . lidocaine (LIDODERM) 5 % Place 1 patch onto the skin every 12 (twelve) hours. Remove & Discard patch within 12 hours or as directed by MD  . levETIRAcetam (KEPPRA) 1000 MG tablet Take 1 tablet (1,000 mg total) by mouth 2 (two) times daily.  . Midazolam (NAYZILAM) 5 MG/0.1ML SOLN Place 5 mg into the nose as needed.  Marland Kitchen QUEtiapine (SEROQUEL) 50 MG tablet Take 1 tablet (50 mg total) by mouth at bedtime.  . [Paused] amLODipine (NORVASC) 5 MG tablet Take 1 tablet by mouth once daily (Patient not taking: Reported on 03/15/2023)  . cefdinir (OMNICEF) 300 MG capsule Take 1 capsule (300 mg total) by mouth 2 (two) times daily. (Patient not taking: Reported on 04/04/2023)   ***  ALLERGIES  Allergies  Allergen Reactions  . Bee Venom Swelling    SOCIAL & SUBSTANCE USE HISTORY  Social History   Socioeconomic History  . Marital status: Divorced    Spouse name: Not on file  . Number of children: 2  . Years of education: Not on file  . Highest education level: High school graduate  Occupational History  . Not on file  Tobacco Use  . Smoking status: Former    Current packs/day: 0.00    Average packs/day: 2.0 packs/day for 50.0 years (100.0 ttl pk-yrs)    Types: Cigarettes, E-cigarettes    Start date: 02/06/1962    Quit date: 02/07/2012    Years since quitting: 11.1  . Smokeless tobacco: Never  Vaping Use  .  Vaping status: Former  . Start date: 10/06/2013  . Devices: uses no liquid  Substance and Sexual Activity  . Alcohol use: No    Alcohol/week: 0.0 standard drinks of alcohol  . Drug use: No  . Sexual activity: Not Currently  Other Topics Concern  . Not on file  Social History Narrative   Married   Retired   Engineer, manufacturing systems level of education    No children    1 cup of coffee   Social Drivers of Corporate investment banker Strain: Low Risk  (02/22/2023)   Overall Financial Resource Strain (CARDIA)   . Difficulty of Paying Living Expenses: Not very hard  Food Insecurity: No Food Insecurity (02/20/2023)   Hunger Vital Sign   . Worried About Programme researcher, broadcasting/film/video in the Last Year: Never true   . Ran Out of Food in the Last Year: Never true  Transportation Needs: No Transportation Needs (02/20/2023)   PRAPARE - Transportation   . Lack of Transportation (Medical): No   . Lack of Transportation (Non-Medical): No  Physical Activity: Inactive (02/22/2023)   Exercise Vital Sign   .  Days of Exercise per Week: 0 days   . Minutes of Exercise per Session: 0 min  Stress: Patient Unable To Answer (02/22/2023)   [employer] of Occupational Health - Occupational Stress Questionnaire   . Feeling of Stress : Patient unable to answer  Social Connections: Moderately Isolated (02/20/2023)   Social Connection and Isolation Panel [NHANES]   . Frequency of Communication with Friends and Family: Once a week   . Frequency of Social Gatherings with Friends and Family: More than three times a week   . Attends Religious Services: Never   . Active Member of Clubs or Organizations: No   . Attends Banker Meetings: Never   . Marital Status: Married   Social History   Tobacco Use  Smoking Status Former  . Current packs/day: 0.00  . Average packs/day: 2.0 packs/day for 50.0 years (100.0 ttl pk-yrs)  . Types: Cigarettes, E-cigarettes  . Start date: 02/06/1962  . Quit date: 02/07/2012  .  Years since quitting: 11.1  Smokeless Tobacco Never   Social History   Substance and Sexual Activity  Alcohol Use No  . Alcohol/week: 0.0 standard drinks of alcohol   Social History   Substance and Sexual Activity  Drug Use No    Additional pertinent information ***.  FAMILY HISTORY  Family History  Problem Relation Age of Onset  . Hypertension Mother   . Ovarian cancer Mother 50  . Dementia Mother   . Heart disease Father   . Stroke Father   . Seizures Father   . Ovarian cancer Sister        ? dx cancer had hyst.  . Breast cancer Neg Hx    Family Psychiatric History (if known):  ***  MENTAL STATUS EXAM (MSE)  Mental Status Exam: General Appearance: {Appearance:22683}  Orientation:  {BHH ORIENTATION (PAA):22689}  Memory:  {BHH MEMORY:22881}  Concentration:  {Concentration:21399}  Recall:  {BHH GOOD/FAIR/POOR:22877}  Attention  {BH Attention Span:31825}  Eye Contact:  {BHH EYE CONTACT:22684}  Speech:  {Speech:22685}  Language:  {BHH GOOD/FAIR/POOR:22877}  Volume:  {Volume (PAA):22686}  Mood: ***  Affect:  {Affect (PAA):22687}  Thought Process:  {Thought Process (PAA):22688}  Thought Content:  {Thought Content:22690}  Suicidal Thoughts:  {ST/HT (PAA):22692}  Homicidal Thoughts:  {ST/HT (PAA):22692}  Judgement:  {Judgement (PAA):22694}  Insight:  {Insight (PAA):22695}  Psychomotor Activity:  {Psychomotor (PAA):22696}  Akathisia:  {BHH YES OR NO:22294}  Fund of Knowledge:  {BHH GOOD/FAIR/POOR:22877}    Assets:  {Assets (PAA):22698}  Cognition:  {chl bhh cognition:304700322}  ADL's:  {BHH ZOX'W:96045}  AIMS (if indicated):       VITALS  Blood pressure (!) 140/82, pulse 86, temperature 98.1 F (36.7 C), temperature source Oral, resp. rate 18, height 5\' 5"  (1.651 m), weight 70.3 kg, SpO2 96%.  LABS  Admission on 04/04/2023  Component Date Value Ref Range Status  . Sodium 04/04/2023 141  135 - 145 mmol/L Final  . Potassium 04/04/2023 3.4 (L)  3.5 - 5.1 mmol/L  Final  . Chloride 04/04/2023 103  98 - 111 mmol/L Final  . CO2 04/04/2023 27  22 - 32 mmol/L Final  . Glucose, Bld 04/04/2023 118 (H)  70 - 99 mg/dL Final   Glucose reference range applies only to samples taken after fasting for at least 8 hours.  . BUN 04/04/2023 25 (H)  8 - 23 mg/dL Final  . Creatinine, Ser 04/04/2023 0.92  0.44 - 1.00 mg/dL Final  . Calcium 40/98/1191 9.4  8.9 - 10.3 mg/dL Final  .  Total Protein 04/04/2023 7.3  6.5 - 8.1 g/dL Final  . Albumin 09/81/1914 3.8  3.5 - 5.0 g/dL Final  . AST 78/29/5621 30  15 - 41 U/L Final  . ALT 04/04/2023 18  0 - 44 U/L Final  . Alkaline Phosphatase 04/04/2023 78  38 - 126 U/L Final  . Total Bilirubin 04/04/2023 0.6  0.0 - 1.2 mg/dL Final  . GFR, Estimated 04/04/2023 >60  >60 mL/min Final   Comment: (NOTE) Calculated using the CKD-EPI Creatinine Equation (2021)   . Anion gap 04/04/2023 11  5 - 15 Final   Performed at [facility], [address] [place], [name] [address]  . Alcohol, Ethyl (B) 04/04/2023 <10  <10 mg/dL Final   Comment: (NOTE) Lowest detectable limit for serum alcohol is 10 mg/dL.  For medical purposes only. Performed at [facility], [address], [place], [name] [address]   . Salicylate Lvl 04/04/2023 <7.0 (L)  7.0 - 30.0 mg/dL Final   Performed at [facility], [address] [place], [name] [address]  . Acetaminophen (Tylenol), Serum 04/04/2023 <10 (L)  10 - 30 ug/mL Final   Comment: (NOTE) Therapeutic concentrations vary significantly. A range of 10-30 ug/mL  may be an effective concentration for many patients. However, some  are best treated at concentrations outside of this range. Acetaminophen concentrations >150 ug/mL at 4 hours after ingestion  and >50 ug/mL at 12 hours after ingestion are often associated with  toxic reactions.  Performed at [facility], [address], [place], [name] [address]   . WBC 04/04/2023 6.2  4.0 - 10.5 K/uL Final  . RBC  04/04/2023 4.24  3.87 - 5.11 MIL/uL Final  . Hemoglobin 04/04/2023 12.5  12.0 - 15.0 g/dL Final  . HCT 44/02/270 40.0  36.0 - 46.0 % Final  . MCV 04/04/2023 94.3  80.0 - 100.0 fL Final  . MCH 04/04/2023 29.5  26.0 - 34.0 pg Final  . MCHC 04/04/2023 31.3  30.0 - 36.0 g/dL Final  . RDW 53/66/4403 14.9  11.5 - 15.5 % Final  . Platelets 04/04/2023 167  150 - 400 K/uL Final  . nRBC 04/04/2023 0.0  0.0 - 0.2 % Final   Performed at [facility], [address] [place], [name] [address]    PSYCHIATRIC REVIEW OF SYSTEMS (ROS)  ROS: Notable for the following relevant positive findings: ROS  Additional findings:      Musculoskeletal: {Musculoskeletal neeeds/assessment:304550014}      Gait & Station: {Gait and Station:304550016}      Pain Screening: {Pain Description:304550015}      Nutrition & Dental Concerns: {Nutrition & Dental Concerns:304550017}  RISK FORMULATION/ASSESSMENT  Is the patient experiencing any suicidal or homicidal ideations: {yes/no:20286}       Explain if yes: *** Protective factors considered for safety management: ***  Risk factors/concerns considered for safety management: *** {CHL BH Risk Factors Safety Management:304550011}  Is there a safety management plan with the patient and treatment team to minimize risk factors and promote protective factors: {yes/no:20286}           Explain: *** Is crisis care placement or psychiatric hospitalization recommended: {yes/no:20286}     Based on my current evaluation and risk assessment, patient is determined at this time to be at:  {Risk level:304550009}  *RISK ASSESSMENT Risk assessment is a dynamic process; it is possible that this patient's condition, and risk level, may change. This should be re-evaluated and managed over time as appropriate.  Please re-consult psychiatric consult services if additional assistance is needed in terms of risk assessment and management. If your team decides to discharge this patient,  please advise the patient how to best access emergency psychiatric services, or to call 911, if their condition worsens or they feel unsafe in any way.   Assunta Gambles, NP Telepsychiatry Consult Services

## 2023-04-04 NOTE — Progress Notes (Signed)
 Anne Dicker, NP-C Phone: [phone]  Anne [name] is a 73 y.o. female who presents today for hospital follow up.   Discussed the use of AI scribe software for clinical note transcription with the patient, who gave verbal consent to proceed.  History of Present Illness   Anne [name] is a 73 year old female with dementia who presents with difficulty managing care at home after recent hospitalization. She is accompanied by her caregiver, who is also her spouse.  She was recently discharged from a ten-day hospital stay for suicidal ideation and has been experiencing significant agitation and difficulty cooperating with her caregiver since returning home two days ago. Her mood is described as 'hateful,' and she perceives her caregiver as 'the enemy.'  Her caregiver reports challenges in managing her care at home, including difficulty with medication adherence and resistance to assistance. She has been agitated and uncooperative, making caregiving challenging. Her caregiver is concerned about her ability to manage at home and is seeking assistance with caregiving resources.  She was hospitalized previously for 28 days due to a fall that resulted in bleeding on the brain. During her recent hospitalization, she developed a urinary tract infection, which exacerbated her dementia symptoms. She was treated with antibiotics for the infection. Attempts to find a suitable rehabilitation facility have been unsuccessful due to her dementia, and she was sent home without placement in a facility.  She has a history of smoking and currently uses e-cigarettes, although she managed without them during her hospital stays. She lives with her caregiver, who is also her spouse. Her daughter and grandson live nearby but do not provide assistance.  No thoughts of self-harm. No shortness of breath, and any breathing issues are attributed to past smoking. She does not recall resisting medication administration.       Social History   Tobacco Use  Smoking Status Former   Current packs/day: 0.00   Average packs/day: 2.0 packs/day for 50.0 years (100.0 ttl pk-yrs)   Types: Cigarettes, E-cigarettes   Start date: 02/06/1962   Quit date: 02/07/2012   Years since quitting: 11.1  Smokeless Tobacco Never    Current Outpatient Medications on File Prior to Visit  Medication Sig Dispense Refill   acetaminophen (TYLENOL) 500 MG tablet Take 1,000 mg by mouth every 6 (six) hours as needed for moderate pain (pain score 4-6).     [Paused] amLODipine (NORVASC) 5 MG tablet Take 1 tablet by mouth once daily (Patient not taking: Reported on 03/15/2023) 90 tablet 0   cefdinir (OMNICEF) 300 MG capsule Take 1 capsule (300 mg total) by mouth 2 (two) times daily. 14 capsule 0   levETIRAcetam (KEPPRA) 1000 MG tablet Take 1 tablet (1,000 mg total) by mouth 2 (two) times daily. 180 tablet 3   levothyroxine (SYNTHROID) 112 MCG tablet Take 1 tablet (112 mcg total) by mouth daily at 6 (six) AM. 30 tablet 0   lidocaine (LIDODERM) 5 % Place 1 patch onto the skin every 12 (twelve) hours. Remove & Discard patch within 12 hours or as directed by MD 10 patch 0   memantine (NAMENDA) 10 MG tablet Take 1 tablet (10 mg total) by mouth 2 (two) times daily. 60 tablet 11   Midazolam (NAYZILAM) 5 MG/0.1ML SOLN Place 5 mg into the nose as needed. 2 each 2   Multiple Vitamin (MULTIVITAMIN) tablet Take 1 tablet by mouth daily.     QUEtiapine (SEROQUEL) 50 MG tablet Take 1 tablet (50 mg total) by mouth at bedtime.  90 tablet 0   rivastigmine (EXELON) 3 MG capsule Take 1 capsule (3 mg total) by mouth 2 (two) times daily. 60 capsule 11   rosuvastatin (CRESTOR) 20 MG tablet Take 1 tablet (20 mg total) by mouth daily. 90 tablet 3   sertraline (ZOLOFT) 100 MG tablet TAKE 1 & 1/2 (ONE & ONE-HALF) TABLETS BY MOUTH ONCE DAILY 135 tablet 0   Current Facility-Administered Medications on File Prior to Visit  Medication Dose Route Frequency Provider Last Rate  Last Admin   denosumab (XGEVA) injection 120 mg  120 mg Subcutaneous Q30 days Creig Hines, MD   120 mg at 03/05/19 1525   heparin lock flush 100 UNIT/ML injection             ROS see history of present illness  Objective  Physical Exam Vitals:   04/04/23 0927  BP: 124/82  Pulse: 78  Temp: 97.9 F (36.6 C)  SpO2: 93%    BP Readings from Last 3 Encounters:  04/04/23 124/82  04/02/23 124/65  03/15/23 112/68   Wt Readings from Last 3 Encounters:  04/04/23 138 lb 3.2 oz (62.7 kg)  03/23/23 141 lb 1.5 oz (64 kg)  03/15/23 141 lb 3.2 oz (64 kg)    Physical Exam Constitutional:      General: She is not in acute distress.    Appearance: Normal appearance.  HENT:     Head: Normocephalic.  Cardiovascular:     Rate and Rhythm: Normal rate and regular rhythm.     Heart sounds: Normal heart sounds.  Pulmonary:     Effort: Pulmonary effort is normal.     Breath sounds: Normal breath sounds.  Skin:    General: Skin is warm and dry.  Neurological:     Mental Status: She is alert.     Assessment/Plan: Please see individual problem list.  Dementia with behavioral disturbance [facility]) Assessment & Plan: Agitation and non-compliance with care at home are causing caregiver stress. A recent hospitalization for a urinary tract infection exacerbated dementia symptoms. Previous attempts to find a facility have been unsuccessful due to dementia. Refer to social work for urgent assistance in finding a facility and/or home health resources. Consider dementia resources in the community.  Orders: -     AMB Referral [facility] Care Management   Return in 2 months (on 06/07/2023) for TOC as scheduled.   Anne Dicker, NP-C [facility]

## 2023-04-04 NOTE — Assessment & Plan Note (Addendum)
 Agitation and non-compliance with care at home are causing caregiver stress. A recent hospitalization for a urinary tract infection exacerbated dementia symptoms. Previous attempts to find a facility have been unsuccessful due to dementia. Refer to social work for urgent assistance in finding a facility and/or home health resources. Consider dementia resources in the community.

## 2023-04-04 NOTE — Telephone Encounter (Signed)
 Anne [name] called back and advised that Anne [name] was new case worker called [phone] and spoke with Social worker for case was advised that SW working with patients Anne [name] to get Anne [name] placed in a facility I advised SW that Anne [name] had been in office to approximately 2 hours and during that time so provider went to bathroom and then left and was going down sidewalk tol eave so Anne [name] could not have Anne [name] "put away". Anne [name] advised [place] myself and CMA she was not going home with Anne [name] and to make him leave her alone. Anne [name] refused to comeback inside until I advised we were going in a different area of building and he would not be there.  Took Anne [name] to office up front to sit with Anne [name] until could speak with Anne [name] (Anne [name]). Advised Anne [name] Anne [name] would not leave with him and we had called Anne [name] and Anne [name] was on her way to pick her up. Anne [name] agreed and left.   While sitting with Anne [name] waiting for Anne [name] Anne [name] stated many times she was not going home with Anne [name] and if she had to she would burn house down with both of them in it. Anne [name] stated he wants to put me in a home and get rid of me. Anne [name] Anne [name] arrived and Anne [name] was very happy to go with Anne [name]. All this was told to SW. SW stated " he advised Anne [name] to file for medicaid" I advised SW I did not think Anne [name] could complete this process without help. SW staed he would call Anne [name] I gave name and number to contact.

## 2023-04-04 NOTE — Telephone Encounter (Signed)
 FL2 form has been completed as much as I could. Form has been placed on provider desk for review and signature.

## 2023-04-04 NOTE — ED Notes (Signed)
Vol pending consult 

## 2023-04-04 NOTE — BH Assessment (Addendum)
 Comprehensive Clinical Assessment (CCA) Note  04/04/2023 Anne [name] [id number]  Chief Complaint: Patient is a 73 year old female presenting to [facility] ED voluntarily. Per triage note Patient to ED via POV with family for psych evaluation. Pt has hx of dementia. Per daughter, pt was at appointment this AM and was refusing to leave with husband. APS is involved per daughter and they informed daughter to bring patient to ED. Denies SI/HI. Patient was recently in this ED from 03/23/23-04/02/23 for similar presentation and at that time was reporting SI. Patient was discharged on 04/02/23 back to the care of her husband, today the patient had a appointment and was refusing to leave the appointment with her husband. During assessment patient appears alert and oriented x2, calm and cooperative but confused and somewhat disorganized. Patient reports "I'm only supposed to be here for a procedure" and points to her hip "then I was going home with my husband, they won't even let me call me." Patient complains about another patient across the hall from her "that lady in there is yelling and I don't wan to be in here listening to that, it makes me want to hurt myself, oh wait I didn't mean to say that." Patient often goes from subject to subject then gets frustrated when she can't articulate what she wants to articulate.  Chief Complaint  Patient presents with   Psychiatric Evaluation   Visit Diagnosis: Dementia     CCA Screening, Triage and Referral (STR)  Patient Reported Information How did you hear about [place]? Family/Friend  Referral name: No data recorded Referral phone number: No data recorded  Whom do you see for routine medical problems? No data recorded Practice/Facility Name: No data recorded Practice/Facility Phone Number: No data recorded Name of Contact: No data recorded Contact Number: No data recorded Contact Fax Number: No data recorded Prescriber Name: No data recorded Prescriber Address  (if known): No data recorded  What Is the Reason for Your Visit/Call Today? Patient to ED via POV with family for psych evaluation. Pt has hx of dementia. Per daughter, pt was at appointment this AM and was refusing to leave with husband. APS is involved per daughter and they informed daughter to bring patient to ED. Denies SI/HI.  How Long Has This Been Causing You Problems? > than 6 months  What Do You Feel Would Help You the Most Today? Treatment for Depression or other mood problem   Have You Recently Been in Any Inpatient Treatment (Hospital/Detox/Crisis Center/28-Day Program)? No data recorded Name/Location of Program/Hospital:No data recorded How Long Were You There? No data recorded When Were You Discharged? No data recorded  Have You Ever Received Services From [facility] Before? No data recorded Who Do You See at [facility]? No data recorded  Have You Recently Had Any Thoughts About Hurting Yourself? Yes  Are You Planning to Commit Suicide/Harm Yourself At This time? No   Have you Recently Had Thoughts About Hurting Someone [name]? No  Explanation: non reported   Have You Used Any Alcohol or Drugs in the Past 24 Hours? No  How Long Ago Did You Use Drugs or Alcohol? No data recorded What Did You Use and How Much? No data recorded  Do You Currently Have a Therapist/Psychiatrist? No  Name of Therapist/Psychiatrist: No data recorded  Have You Been Recently Discharged From Any Office Practice or Programs? No  Explanation of Discharge From Practice/Program: Pt discharged from [facility] on 03/26/22.     CCA Screening  Triage Referral Assessment Type of Contact: Face-to-Face  Is this Initial or Reassessment? No data recorded Date Telepsych consult ordered in [facility]:  No data recorded Time Telepsych consult ordered in [facility]:  No data recorded  Patient Reported Information Reviewed? No data recorded Patient Left Without Being Seen? No data recorded Reason for Not  Completing Assessment: No data recorded  Collateral Involvement: Pt husband, Anne [name] particpated in assessment.   Does Patient Have a Automotive engineer Guardian? No data recorded Name and Contact of Legal Guardian: No data recorded If Minor and Not Living with Parent(s), Who has Custody? -- (n/a)  Is CPS involved or ever been involved? Never  Is APS involved or ever been involved? Currently   Patient Determined To Be At Risk for Harm To Self or Others Based on Review of Patient Reported Information or Presenting Complaint? No  Method: No Plan  Availability of Means: No access or NA  Intent: Vague intent or NA  Notification Required: No need or identified person  Additional Information for Danger to Others Potential: -- (n/a)  Additional Comments for Danger to Others Potential: Pt husband, Korbin Notaro, participated in assessment.  Are There Guns or Other Weapons in Your Home? Yes  Types of Guns/Weapons: Pt husband reports a gun in the house; also, reports "it is laying on the table, no one has bother it". (n/a)  Are These Weapons Safely Secured?                            No  Who Could Verify You Are Able To Have These Secured: -- (n/a)  Do You Have any Outstanding Charges, Pending Court Dates, Parole/Probation? -- (n/a)  Contacted To Inform of Risk of Harm To Self or Others: Education officer, community of Assessment: [facility] ED   Does Patient Present under Involuntary Commitment? Yes  IVC Papers Initial File Date: No data recorded  [place] of Residence: [place]   Patient Currently Receiving the Following Services: Medication Management   Determination of Need: Emergent (2 hours)   Options For Referral: Inpatient Hospitalization     CCA Biopsychosocial Intake/Chief Complaint:  No data recorded Current Symptoms/Problems: No data recorded  Patient Reported Schizophrenia/Schizoaffective Diagnosis in Past: No   Strengths: Asking  questions  Preferences: No data recorded Abilities: No data recorded  Type of Services Patient Feels are Needed: No data recorded  Initial Clinical Notes/Concerns: No data recorded  Mental Health Symptoms Depression:  Change in energy/activity; Difficulty Concentrating   Duration of Depressive symptoms: Greater than two weeks   Mania:  None   Anxiety:   Irritability; Restlessness; Fatigue; Difficulty concentrating   Psychosis:  None   Duration of Psychotic symptoms: No data recorded  Trauma:  None   Obsessions:  None   Compulsions:  None   Inattention:  None   Hyperactivity/Impulsivity:  Always on the go   Oppositional/Defiant Behaviors:  None   Emotional Irregularity:  None   Other Mood/Personality Symptoms:  Aggressive/Anxious    Mental Status Exam Appearance and self-care  Stature:  Average   Weight:  Average weight   Clothing:  Casual   Grooming:  Normal   Cosmetic use:  Age appropriate   Posture/gait:  Normal   Motor activity:  Agitated; Restless   Sensorium  Attention:  Confused; Unaware   Concentration:  Preoccupied; Scattered   Orientation:  Person; Place   Recall/memory:  Defective in Short-term; Defective in Recent   Affect  and Mood  Affect:  Anxious   Mood:  Angry; Anxious   Relating  Eye contact:  Normal   Facial expression:  Anxious; Tense   Attitude toward examiner:  Defensive; Irritable   Thought and Language  Speech flow: Clear and Coherent   Thought content:  Persecutions   Preoccupation:  Ruminations   Hallucinations:  None   Organization:  No data recorded  [employer] of Knowledge:  Fair   Intelligence:  Average   Abstraction:  Abstract   Judgement:  Poor   Reality Testing:  Unaware; Variable   Insight:  Lacking; Gaps; Poor   Decision Making:  Confused   Social Functioning  Social Maturity:  Impulsive   Social Judgement:  Heedless   Stress  Stressors:  Relationship; Family  conflict   Coping Ability:  Overwhelmed   Skill Deficits:  Self-care; Decision making   Supports:  Family     Religion: Religion/Spirituality Are You A Religious Person?:  (n/a) How Might This Affect Treatment?: did not assessed  Leisure/Recreation: Leisure / Recreation Do You Have Hobbies?: Yes Leisure and Hobbies: Pt reports she enjoys crafts  Exercise/Diet: Exercise/Diet Do You Exercise?: No Have You Gained or Lost A Significant Amount of Weight in the Past Six Months?: No Do You Follow a Special Diet?: No Do You Have Any Trouble Sleeping?: No   CCA Employment/Education Employment/Work Situation: Employment / Work Academic librarian Situation: Retired Passenger transport manager has Been Impacted by Current Illness: No Has Patient ever Been in Equities trader?: No  Education: Education Last Grade Completed: 12 Did You Product manager?: No Did You Have An Individualized Education Program (IIEP): No Did You Have Any Difficulty At [employer]?: No Patient's Education Has Been Impacted by Current Illness: No   CCA Family/Childhood History Family and Relationship History: Family history Marital status: Married What types of issues is patient dealing with in the relationship?: Health concerns Additional relationship information: n/a Does patient have children?: Yes How many children?: 1 How is patient's relationship with their children?: close  Childhood History:  Childhood History By whom was/is the patient raised?: Mother Did patient suffer any verbal/emotional/physical/sexual abuse as a child?: No Did patient suffer from severe childhood neglect?: No Has patient ever been sexually abused/assaulted/raped as an adolescent or adult?: No Was the patient ever a victim of a crime or a disaster?: No Witnessed domestic violence?: No Has patient been affected by domestic violence as an adult?: No  Child/Adolescent Assessment:     CCA Substance Use Alcohol/Drug Use: Alcohol /  Drug Use Pain Medications: See MRA Prescriptions: See MRA Over the Counter: See MRA History of alcohol / drug use?: No history of alcohol / drug abuse Longest period of sobriety (when/how long):  (n/a) Negative Consequences of Use:  (n/a) Withdrawal Symptoms:  (n/a)                         ASAM's:  Six Dimensions of Multidimensional Assessment  Dimension 1:  Acute Intoxication and/or Withdrawal Potential:   Dimension 1:  Description of individual's past and current experiences of substance use and withdrawal:  (n/a)  Dimension 2:  Biomedical Conditions and Complications:   Dimension 2:  Description of patient's biomedical conditions and  complications:  (n/a)  Dimension 3:  Emotional, Behavioral, or Cognitive Conditions and Complications:  Dimension 3:  Description of emotional, behavioral, or cognitive conditions and complications:  (n/a)  Dimension 4:  Readiness to Change:  Dimension 4:  Description  of Readiness to Change criteria:  (n/a)  Dimension 5:  Relapse, Continued use, or Continued Problem Potential:  Dimension 5:  Relapse, continued use, or continued problem potential critiera description:  (n/a)  Dimension 6:  Recovery/Living Environment:  Dimension 6:  Recovery/Iiving environment criteria description:  (n/a)  ASAM Severity Score:    ASAM Recommended Level of Treatment: ASAM Recommended Level of Treatment:  (n/a)   Substance use Disorder (SUD) Substance Use Disorder (SUD)  Checklist Symptoms of Substance Use:  (n/a)  Recommendations for Services/Supports/Treatments:    DSM5 Diagnoses: Patient Active Problem List   Diagnosis Date Noted   Suicidal ideation 03/24/2023   Altered mental status 03/15/2023   Abnormal urinalysis 03/15/2023   Weakness 02/26/2023   Acute urinary retention 02/05/2023   Seizure (HCC) 01/22/2023   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/22/2023   Tremor 11/23/2022   Urinary urgency 03/26/2022   Sore throat  03/07/2022   Seizure-like activity (HCC) 02/08/2022   Seborrheic keratosis 09/20/2021   Closed fracture of nasal bones 07/26/2021   Falls frequently 07/26/2021   Head injury 07/07/2021   Left hip pain 07/07/2021   Lipoma 07/07/2021   Slurred speech 06/07/2021   Subdural hematoma (HCC) 01/23/2021   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   Aortic atherosclerosis (HCC) 08/24/2020   Dementia with behavioral disturbance (HCC) 03/14/2020   Left shoulder pain 07/22/2019   Hypothyroidism 02/16/2019   Goals of care, counseling/discussion 08/06/2018   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), in remission, 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   Skin tear of forearm without complication, initial encounter 12/06/2017   Bruising 12/06/2017   Overweight 08/05/2017   Anemia 01/23/2017   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   Gastritis 01/23/2017   Hypokalemia 01/03/2017   Heart murmur 07/27/2016   Low back pain 08/15/2015   Hypertension 02/22/2015   HLD (hyperlipidemia) 02/22/2015   Depression 02/22/2015    Patient Centered Plan: Patient is on the following Treatment Plan(s):  Impulse Control   Referrals to Alternative Service(s): Referred to Alternative Service(s):   Place:   Date:   Time:    Referred to Alternative Service(s):   Place:   Date:   Time:    Referred to Alternative Service(s):   Place:   Date:   Time:    Referred to Alternative Service(s):   Place:   Date:   Time:      @[facility]FC[facility] @  [employer], LCAS-A

## 2023-04-04 NOTE — ED Notes (Signed)
 MD at the bedside for pt evaluation

## 2023-04-04 NOTE — ED Triage Notes (Signed)
 Patient to ED via POV with family for psych evaluation. Pt has hx of dementia. Per daughter, pt was at appointment this AM and was refusing to leave with husband. APS is involved per daughter and they informed daughter to bring patient to ED. Denies SI/HI.

## 2023-04-04 NOTE — ED Notes (Signed)
 Patient belongings given to patient's daughter to take home.

## 2023-04-04 NOTE — Consult Note (Signed)
 [facility] Consult Note  Patient Name: Anne [name] MRN: [id number] DOB: 06/08/1950 DATE OF Consult: 04/04/2023  PRIMARY PSYCHIATRIC DIAGNOSES  1.  Dementia with Behavioral Disturbances    RECOMMENDATIONS  Recommendations: Medication recommendations:  -- Continue home medication regimen:  Zoloft 100mg  po daily for depression, anxiety Seroquel 50mg  po at bedtime to assist with mood, behavior in the setting of dementia. May add additional low dose of Seroquel in daytime to assist with agitation/ mood; Seroquel 12.5mg  po daily  -- If patient were to become agitated while in the ED, would recommend Zyprexa 2.5-5mg  PO/IM Q6H PRN.   Avoid benzodiazepines as these medications may worsen mentation  Non-Medication/therapeutic recommendations:  -- ED primary team to obtain collateral from family in the morning -- Patient does not meet criteria for inpatient psychiatric care at this time. Symptoms/ presentation appear congruent with dementia diagnosis.  -- Would recommend case management consultation to assist with possible placement options or other services available for the patient and family to assist with treatment of dementia.   Is inpatient psychiatric hospitalization recommended for this patient?  No (Explain why): Patient does not meet criteria for inpatient psychiatric admission. Symptoms related to dementia diagnosis.   Is another care setting recommended for this patient? (examples may include Crisis Stabilization Unit, Residential/Recovery Treatment, ALF/SNF, Memory Care Unit)   Yes (Explain why): patient would benefit from memory care unit due to dementia. Per chart review, appears husband has been having difficulty with care at home.   From a psychiatric perspective, is this patient appropriate for discharge to an outpatient setting/resource or other less restrictive environment for continued care?   Yes (Explain why): would recommend case management consultation to assist  with options of care following discharge: placement options or other in-home services family may feel is necessary to care for with her dementia  Follow-Up Telepsychiatry C/L services: We will sign off for now. Please re-consult our service if needed for any concerning changes in the patient's condition, discharge planning, or questions.  Communication: Treatment team members (and family members if applicable) who were involved in treatment/care discussions and planning, and with whom we spoke or engaged with via secure text/chat, include the following: Dr. Elesa Massed and ED primary team   Thank you for involving [place] in the care of this patient. If you have any additional questions or concerns, please call [phone] and ask for me or the provider on-call.  TELEPSYCHIATRY ATTESTATION & CONSENT  As the provider for this telehealth consult, I attest that I verified the patient's identity using two separate identifiers, introduced myself to the patient, provided my credentials, disclosed my location, and performed this encounter via a HIPAA-compliant, real-time, face-to-face, two-way, interactive audio and video platform and with the full consent and agreement of the patient (or guardian as applicable.)  Patient physical location: ED in [facility]. Telehealth provider physical location: home office in state of [name]  Video start time: 2239 [facility] Time) Video end time: 2249 (Central Time)  IDENTIFYING DATA  Anne [name] is a 73 y.o. year-old female for whom a psychiatric consultation has been ordered by the primary provider. The patient was identified using two separate identifiers.  CHIEF COMPLAINT/REASON FOR CONSULT  Dementia   HISTORY OF PRESENT ILLNESS (HPI)  The patient is a 73yo female who presented to the emergency department with family for psychiatric evaluation. Patient with history of dementia. Per report, patient was at an appointment this morning when she  refused to leave with her  husband. APS is involved, per family, and they informed daughter to bring patient to the ED for evaluation. No SI/HI.  Upon interview, patient is calm, cooperative. She is difficult of hearing so staff assist with evaluation. No agitation or aggressive behaviors observed. She does get frustrated throughout assessment when she has trouble hearing the assessment questions and if she is unable to remember information. She is alert and oriented to person, DOB, month (February), year (2025), and location [facility]). She does not know why she is in the hospital tonight. Believes she came in for "surgery" on her "female parts".   Patient is unable to recall events leading to ED visit. She does not remember where she was prior to coming to the hospital. Patient states she lives with her husband. States things at home are "fine". Patient denies recent agitation or irritability at home. She denies recent depression, sadness, and anxiety. She feels she is sleeping and eating well. She denies suicidal and homicidal ideations. She denies hallucinations. She does not appear to be responding to internal stimuli, no thought blocking noted. No delusions apparent otherwise. Thought process is confused, she is a poor historian.     PAST PSYCHIATRIC HISTORY  History of Depression and Dementia Per chart review, no prior psychiatric hospitalizations No outpatient geri psychiatry established. Per chart PCP managing.    Otherwise as per HPI above.  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   rosuvastatin (CRESTOR) 20 MG tablet Take 1 tablet (20 mg total) by mouth daily.   memantine (NAMENDA) 10 MG tablet Take 1 tablet (10 mg total) by mouth 2 (two) times daily.   rivastigmine (EXELON) 3 MG capsule Take 1 capsule (3 mg total) by mouth 2 (two) times daily.   acetaminophen (TYLENOL) 500 MG tablet Take 1,000 mg by mouth every 6 (six) hours as needed for moderate pain (pain score 4-6).   levothyroxine (SYNTHROID) 112 MCG tablet Take 1 tablet (112 mcg total) by mouth daily at 6 (six) AM.   sertraline (ZOLOFT) 100 MG tablet TAKE 1 & 1/2 (ONE & ONE-HALF) TABLETS BY MOUTH ONCE DAILY   lidocaine (LIDODERM) 5 % Place 1 patch onto the skin every 12 (twelve) hours. Remove & Discard patch within 12 hours or as directed by MD   levETIRAcetam (KEPPRA) 1000 MG tablet Take 1 tablet (1,000 mg total) by mouth 2 (two) times daily.   Midazolam (NAYZILAM) 5 MG/0.1ML SOLN Place 5 mg into the nose as needed.   QUEtiapine (SEROQUEL) 50 MG tablet Take 1 tablet (50 mg total) by mouth at bedtime.   [Paused] amLODipine (NORVASC) 5 MG tablet Take 1 tablet by mouth once daily (Patient not taking: Reported on 03/15/2023)   cefdinir (OMNICEF) 300 MG capsule Take 1 capsule (300 mg total) by mouth 2 (two) times daily. (Patient not  taking: Reported on 04/04/2023)     ALLERGIES  Allergies  Allergen Reactions   Bee Venom Swelling    SOCIAL & SUBSTANCE USE HISTORY  Social History   Socioeconomic History   Marital status: Divorced    Spouse name: Not on file   Number of children: 2   Years of education: Not on file   Highest education level: High school graduate  Occupational History   Not on file  Tobacco Use   Smoking status: Former    Current packs/day: 0.00    Average packs/day: 2.0 packs/day for 50.0 years (100.0 ttl  pk-yrs)    Types: Cigarettes, E-cigarettes    Start date: 02/06/1962    Quit date: 02/07/2012    Years since quitting: 11.1   Smokeless tobacco: Never  Vaping Use   Vaping status: Former   Start date: 10/06/2013   Devices: uses no liquid  Substance and Sexual Activity   Alcohol use: No    Alcohol/week: 0.0 standard drinks of alcohol   Drug use: No   Sexual activity: Not Currently  Other Topics Concern   Not on file  Social History Narrative   Married   Retired   Engineer, manufacturing systems level of education    No children    1 cup of coffee   Social Drivers of Corporate investment banker Strain: Low Risk  (02/22/2023)   Overall Financial Resource Strain (CARDIA)    Difficulty of Paying Living Expenses: Not very hard  Food Insecurity: No Food Insecurity (02/20/2023)   Hunger Vital Sign    Worried About Running Out of Food in the Last Year: Never true    Ran Out of Food in the Last Year: Never true  Transportation Needs: No Transportation Needs (02/20/2023)   PRAPARE - Administrator, Civil Service (Medical): No    Lack of Transportation (Non-Medical): No  Physical Activity: Inactive (02/22/2023)   Exercise Vital Sign    Days of Exercise per Week: 0 days    Minutes of Exercise per Session: 0 min  Stress: Patient Unable To Answer (02/22/2023)   [place] [employer] - Occupational Stress Questionnaire    Feeling of Stress : Patient unable to answer  Social Connections: Moderately Isolated (02/20/2023)   Social Connection and Isolation Panel [NHANES]    Frequency of Communication with Friends and Family: Once a week    Frequency of Social Gatherings with Friends and Family: More than three times a week    Attends Religious Services: Never    Database administrator or Organizations: No    Attends Engineer, structural: Never    Marital Status: Married   Social History   Tobacco Use  Smoking Status Former   Current packs/day: 0.00   Average  packs/day: 2.0 packs/day for 50.0 years (100.0 ttl pk-yrs)   Types: Cigarettes, E-cigarettes   Start date: 02/06/1962   Quit date: 02/07/2012   Years since quitting: 11.1  Smokeless Tobacco Never   Social History   Substance and Sexual Activity  Alcohol Use No   Alcohol/week: 0.0 standard drinks of alcohol   Social History   Substance and Sexual Activity  Drug Use No    Additional pertinent information: currently resides with husband    FAMILY HISTORY  Family History  Problem Relation Age of Onset   Hypertension Mother    Ovarian cancer Mother 73   Dementia Mother    Heart disease Father    Stroke Father  Seizures Father    Ovarian cancer Sister        ? dx cancer had hyst.   Breast cancer Neg Hx    Family Psychiatric History (if known):  Unknown at this time   MENTAL STATUS EXAM (MSE)  Mental Status Exam: General Appearance: Fairly Groomed  Orientation:  Other:  person, DOB, month, year, location  Memory:  Recent;   Poor  Concentration:  Concentration: Poor  Recall:  Poor  Attention  Fair  Eye Contact:  Good  Speech:  Clear and Coherent  Language:  Good  Volume:  Normal  Mood: "fine"  Affect:  Appropriate  Thought Process:  Disorganized, confusion  Thought Content:  Illogical  Suicidal Thoughts:  No  Homicidal Thoughts:  No  Judgement:  Poor  Insight:  Lacking  Psychomotor Activity:  Normal  Akathisia:  No  Fund of Knowledge:  Poor    Assets:  Architect Housing Social Support  Cognition:  Impaired  ADL's:  Assistance needed   AIMS (if indicated):       VITALS  Blood pressure (!) 140/82, pulse 86, temperature 98.1 F (36.7 C), temperature source Oral, resp. rate 18, height 5\' 5"  (1.651 m), weight 70.3 kg, SpO2 96%.  LABS  Admission on 04/04/2023  Component Date Value Ref Range Status   Sodium 04/04/2023 141  135 - 145 mmol/L Final   Potassium 04/04/2023 3.4 (L)  3.5 - 5.1 mmol/L Final   Chloride  04/04/2023 103  98 - 111 mmol/L Final   CO2 04/04/2023 27  22 - 32 mmol/L Final   Glucose, Bld 04/04/2023 118 (H)  70 - 99 mg/dL Final   Glucose reference range applies only to samples taken after fasting for at least 8 hours.   BUN 04/04/2023 25 (H)  8 - 23 mg/dL Final   Creatinine, Ser 04/04/2023 0.92  0.44 - 1.00 mg/dL Final   Calcium 16/11/9602 9.4  8.9 - 10.3 mg/dL Final   Total Protein 54/10/8117 7.3  6.5 - 8.1 g/dL Final   Albumin 14/78/2956 3.8  3.5 - 5.0 g/dL Final   AST 21/30/8657 30  15 - 41 U/L Final   ALT 04/04/2023 18  0 - 44 U/L Final   Alkaline Phosphatase 04/04/2023 78  38 - 126 U/L Final   Total Bilirubin 04/04/2023 0.6  0.0 - 1.2 mg/dL Final   GFR, Estimated 04/04/2023 >60  >60 mL/min Final   Comment: (NOTE) Calculated using the CKD-EPI Creatinine Equation (2021)    Anion gap 04/04/2023 11  5 - 15 Final   Performed at [facility], [address] [place], [name] [address]   Alcohol, Ethyl (B) 04/04/2023 <10  <10 mg/dL Final   Comment: (NOTE) Lowest detectable limit for serum alcohol is 10 mg/dL.  For medical purposes only. Performed at [facility], [address] [place], [name] [address]    Salicylate Lvl 04/04/2023 <7.0 (L)  7.0 - 30.0 mg/dL Final   Performed at [facility], [address] [place], [name] [address]   Acetaminophen (Tylenol), Serum 04/04/2023 <10 (L)  10 - 30 ug/mL Final   Comment: (NOTE) Therapeutic concentrations vary significantly. A range of 10-30 ug/mL  may be an effective concentration for many patients. However, some  are best treated at concentrations outside of this range. Acetaminophen concentrations >150 ug/mL at 4 hours after ingestion  and >50 ug/mL at 12 hours after ingestion are often associated with  toxic reactions.  Performed at [facility], [address] [place], [name] [address]    WBC 04/04/2023 6.2  4.0 - 10.5 K/uL Final   RBC 04/04/2023 4.24  3.87 - 5.11 MIL/uL  Final   Hemoglobin 04/04/2023 12.5  12.0 - 15.0 g/dL Final   HCT 60/45/4098 40.0  36.0 - 46.0 % Final   MCV 04/04/2023 94.3  80.0 - 100.0 fL Final   MCH 04/04/2023 29.5  26.0 - 34.0 pg Final   MCHC 04/04/2023 31.3  30.0 - 36.0 g/dL Final   RDW 11/91/4782 14.9  11.5 - 15.5 % Final   Platelets 04/04/2023 167  150 - 400 K/uL Final   nRBC 04/04/2023 0.0  0.0 - 0.2 % Final   Performed at [facility], [address] [place], [name] [address]    PSYCHIATRIC REVIEW OF SYSTEMS (ROS)  ROS: Notable for the following relevant positive findings: Review of Systems  Psychiatric/Behavioral:  Positive for memory loss. Negative for depression, hallucinations and suicidal ideas. The patient is not nervous/anxious.     Additional findings:      Musculoskeletal: No abnormal movements observed      Gait & Station: Wheelchair/Walker      Pain Screening: Denies      Nutrition & Dental Concerns: No concerns at this time  RISK FORMULATION/ASSESSMENT  Is the patient experiencing any suicidal or homicidal ideations: No       Explain if yes:  Protective factors considered for safety management: access to care, housing, family/ social support  Risk factors/concerns considered for safety management:  Physical illness/chronic pain Age over 32 Impulsivity Aggression  Is there a safety management plan with the patient and treatment team to minimize risk factors and promote protective factors: Yes           Explain: currently in the ED, medication management, comfort measures, elopement risk, case management consultation  Is crisis care placement or psychiatric hospitalization recommended: No     Based on my current evaluation and risk assessment, patient is determined at this time to be at:  Low risk  *RISK ASSESSMENT Risk assessment is a dynamic process; it is possible that this patient's condition, and risk level, may change. This should be re-evaluated and managed over time as appropriate.  Please re-consult psychiatric consult services if additional assistance is needed in terms of risk assessment and management. If your team decides to discharge this patient, please advise the patient how to best access emergency psychiatric services, or to call 911, if their condition worsens or they feel unsafe in any way.   Assunta Gambles, NP Telepsychiatry Consult Services

## 2023-04-04 NOTE — Telephone Encounter (Signed)
 Patient daughter called and advised patient has been involuntary committed to [facility] and needs FL2 form completed to be placed.

## 2023-04-04 NOTE — Telephone Encounter (Signed)
 Left detailed message and my direct line number to return my call.

## 2023-04-04 NOTE — ED Provider Notes (Signed)
 [facility] Provider Note    Event Date/Time   First MD Initiated Contact with Patient 04/04/23 1312     (approximate)   History   Chief Complaint: Psychiatric Evaluation   HPI  Anne [name] is a 73 y.o. female with a history of GERD, dementia, seizures who is brought to the ED due to agitation and refusing to leave an appointment this morning.  Patient currently states that she feels fine, denies any pain or other acute symptoms.  Reports that she did not feel like taking her medications over the past week.     Physical Exam   Triage Vital Signs: ED Triage Vitals  Encounter Vitals Group     BP 04/04/23 1226 (!) 140/82     Systolic BP Percentile --      Diastolic BP Percentile --      Pulse Rate 04/04/23 1226 86     Resp 04/04/23 1226 18     Temp 04/04/23 1226 98.1 F (36.7 C)     Temp Source 04/04/23 1226 Oral     SpO2 04/04/23 1226 96 %     Weight 04/04/23 1227 155 lb (70.3 kg)     Height 04/04/23 1227 5\' 5"  (1.651 m)     Head Circumference --      Peak Flow --      Pain Score 04/04/23 1227 0     Pain Loc --      Pain Education --      Exclude from Growth Chart --     Most recent vital signs: Vitals:   04/04/23 1226  BP: (!) 140/82  Pulse: 86  Resp: 18  Temp: 98.1 F (36.7 C)  SpO2: 96%    General: Awake, no distress. CV:  Good peripheral perfusion.  Resp:  Normal effort.  Abd:  No distention.  Other:  No wounds   ED Results / Procedures / Treatments   Labs (all labs ordered are listed, but only abnormal results are displayed) Labs Reviewed  COMPREHENSIVE METABOLIC PANEL - Abnormal; Notable for the following components:      Result Value   Potassium 3.4 (*)    Glucose, Bld 118 (*)    BUN 25 (*)    All other components within normal limits  SALICYLATE LEVEL - Abnormal; Notable for the following components:   Salicylate Lvl <7.0 (*)    All other components within normal limits  ACETAMINOPHEN LEVEL - Abnormal;  Notable for the following components:   Acetaminophen (Tylenol), Serum <10 (*)    All other components within normal limits  ETHANOL  CBC  URINE DRUG SCREEN, QUALITATIVE ([facility] ONLY)  URINALYSIS, W/ REFLEX TO CULTURE (INFECTION SUSPECTED)     EKG    RADIOLOGY    PROCEDURES:  Procedures   MEDICATIONS ORDERED IN ED: Medications - No data to display   IMPRESSION / MDM / ASSESSMENT AND PLAN / ED COURSE  I reviewed the triage vital signs and the nursing notes.  Patient's presentation is most consistent with exacerbation of chronic illness.  Patient with chronic dementia is brought to the ED due to an episode of agitation, possibly momentary confusion.  Currently calm.  Will check labs and request psychiatry evaluation.  The patient has been placed in psychiatric observation due to the need to provide a safe environment for the patient while obtaining psychiatric consultation and evaluation, as well as ongoing medical and medication management to treat the patient's condition.  The patient has not  been placed under full IVC at this time.      FINAL CLINICAL IMPRESSION(S) / ED DIAGNOSES   Final diagnoses:  Dementia with agitation, unspecified dementia severity, unspecified dementia type (HCC)     Rx / DC Orders   ED Discharge Orders     None        Note:  This document was prepared using Dragon voice recognition software and may include unintentional dictation errors.   Sharman Cheek, MD 04/04/23 [phone]

## 2023-04-05 ENCOUNTER — Telehealth: Payer: Self-pay

## 2023-04-05 DIAGNOSIS — F03911 Unspecified dementia, unspecified severity, with agitation: Secondary | ICD-10-CM

## 2023-04-05 LAB — URINE DRUG SCREEN, QUALITATIVE (ARMC ONLY)
Amphetamines, Ur Screen: NOT DETECTED
Barbiturates, Ur Screen: NOT DETECTED
Benzodiazepine, Ur Scrn: NOT DETECTED
Cannabinoid 50 Ng, Ur ~~LOC~~: NOT DETECTED
Cocaine Metabolite,Ur ~~LOC~~: NOT DETECTED
MDMA (Ecstasy)Ur Screen: NOT DETECTED
Methadone Scn, Ur: NOT DETECTED
Opiate, Ur Screen: NOT DETECTED
Phencyclidine (PCP) Ur S: NOT DETECTED
Tricyclic, Ur Screen: POSITIVE — AB

## 2023-04-05 LAB — URINALYSIS, W/ REFLEX TO CULTURE (INFECTION SUSPECTED)
Bilirubin Urine: NEGATIVE
Glucose, UA: NEGATIVE mg/dL
Hgb urine dipstick: NEGATIVE
Ketones, ur: NEGATIVE mg/dL
Nitrite: NEGATIVE
Protein, ur: 30 mg/dL — AB
Specific Gravity, Urine: 1.023 (ref 1.005–1.030)
WBC, UA: >50 WBC/hpf (ref 0–5)
pH: 5 (ref 5.0–8.0)

## 2023-04-05 MED ORDER — QUETIAPINE FUMARATE 25 MG PO TABS
12.5000 mg | ORAL_TABLET | Freq: Every day | ORAL | Status: DC
  Administered 2023-04-05 – 2023-04-15 (×11): 12.5 mg via ORAL
  Filled 2023-04-05 (×11): qty 1

## 2023-04-05 NOTE — ED Notes (Addendum)
 Husband completed supervised 15 minute visit

## 2023-04-05 NOTE — ED Notes (Signed)
 Breakfast provided.

## 2023-04-05 NOTE — Telephone Encounter (Signed)
 Called Ms. Anne [name], she provided me with her email [email or web address] I have scanned it to her email as the fax keeps failing. Ms. Anne [name] wanted to know what level of care you wanted for the pt I stated LTCF or SNF. Ms. Anne [name] does not think pt will be able to afford SNF but she will be in touch she stated she was going to work on things and let su know and if mental facility would also be helpful.

## 2023-04-05 NOTE — ED Notes (Signed)
Vol /psych consult complete 

## 2023-04-05 NOTE — ED Notes (Signed)
 Pharmacy said they would send Synthroid within the hour.

## 2023-04-05 NOTE — Telephone Encounter (Signed)
 Called and left a detailed vm on [name] (pts daughter) asking her to confirm the fax number to Ms. Caldwell for the [facility] as we have not been able to have the fax go through.

## 2023-04-05 NOTE — ED Provider Notes (Signed)
-----------------------------------------   6:53 AM on 04/05/2023 -----------------------------------------   Blood pressure (!) 140/82, pulse 86, temperature 98.1 F (36.7 C), temperature source Oral, resp. rate 18, height 5\' 5"  (1.651 m), weight 70.3 kg, SpO2 96%.  The patient is calm and cooperative at this time.  There have been no acute events since the last update.  Awaiting disposition plan from case management/social work.    Gregoria Selvy, Layla Maw, DO 04/05/23 [phone]

## 2023-04-05 NOTE — ED Notes (Signed)
 Meal provided

## 2023-04-05 NOTE — ED Notes (Signed)
 Vol/toc.Marland Kitchen

## 2023-04-06 NOTE — [place] FL2 (Signed)
 [place] MEDICAID FL2 LEVEL OF CARE FORM     IDENTIFICATION  Patient Name: Anne [name] Birthdate: [birth year] Sex: female Admission Date (Current Location): 04/04/2023  [place] and [place] Number:  Chiropodist and Address:  [facility], [address], [place], [name] [address]      Provider Number: [id number]  Attending Physician Name and Address:  No att. providers found  Relative Name and Phone Number:       Current Level of Care: Hospital Recommended Level of Care: Memory Care Prior Approval Number:    Date Approved/Denied:   PASRR Number:    Discharge Plan:      Current Diagnoses: Patient Active Problem List   Diagnosis Date Noted   Dementia with agitation (HCC) 04/05/2023   Suicidal ideation 03/24/2023   Altered mental status 03/15/2023   Abnormal urinalysis 03/15/2023   Weakness 02/26/2023   Acute urinary retention 02/05/2023   Seizure (HCC) 01/22/2023   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/22/2023   Tremor 11/23/2022   Urinary urgency 03/26/2022   Sore throat 03/07/2022   Seizure-like activity (HCC) 02/08/2022   Seborrheic keratosis 09/20/2021   Closed fracture of nasal bones 07/26/2021   Falls frequently 07/26/2021   Head injury 07/07/2021   Left hip pain 07/07/2021   Lipoma 07/07/2021   Slurred speech 06/07/2021   Subdural hematoma (HCC) 01/23/2021   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   Aortic atherosclerosis (HCC) 08/24/2020   Dementia with behavioral disturbance (HCC) 03/14/2020   Left shoulder pain 07/22/2019   Hypothyroidism 02/16/2019   Goals of care, counseling/discussion 08/06/2018   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), in remission, 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   Skin tear of forearm without complication, initial encounter 12/06/2017   Bruising 12/06/2017   Overweight 08/05/2017   Anemia 01/23/2017   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/23/2017   Gastritis 01/23/2017   Hypokalemia 01/03/2017   Heart murmur 07/27/2016   Low back pain 08/15/2015   Hypertension 02/22/2015   HLD (hyperlipidemia) 02/22/2015   Depression 02/22/2015    Orientation RESPIRATION BLADDER Height & Weight        Normal Continent Weight: 155 lb (70.3 kg) Height:  5\' 5"  (165.1 cm)  BEHAVIORAL SYMPTOMS/MOOD NEUROLOGICAL BOWEL NUTRITION STATUS     (Dementia) Continent Diet (reg)  AMBULATORY STATUS COMMUNICATION OF NEEDS Skin   Supervision Verbally Normal                       Personal Care Assistance Level of Assistance  Bathing, Feeding, Dressing Bathing Assistance: Limited assistance Feeding assistance: Limited assistance Dressing Assistance: Limited assistance     Functional Limitations Info             SPECIAL CARE FACTORS FREQUENCY                       Contractures      Additional Factors Info  Allergies   Allergies Info: Bee Venom           Current Medications (04/06/2023):  This is the current hospital active medication list Current Facility-Administered Medications  Medication Dose Route Frequency Provider Last Rate Last Admin   levETIRAcetam (KEPPRA) tablet 1,000 mg  1,000 mg Oral BID Ward, Kristen N, DO   1,000 mg at 04/06/23 0920   levothyroxine (SYNTHROID) tablet 112 mcg  112 mcg Oral Q0600 Ward, Kristen N, DO   112 mcg at  04/05/23 0949   memantine (NAMENDA) tablet 10 mg  10 mg Oral BID Ward, Kristen N, DO   10 mg at 04/06/23 0920   QUEtiapine (SEROQUEL) tablet 12.5 mg  12.5 mg Oral QPC breakfast Ward, Kristen N, DO   12.5 mg at 04/06/23 0945   QUEtiapine (SEROQUEL) tablet 50 mg  50 mg Oral QHS Ward, Kristen N, DO   50 mg at 04/05/23 2116   rivastigmine (EXELON) capsule 3 mg  3 mg Oral BID Ward, Kristen N, DO   3 mg at 04/06/23 0920   rosuvastatin (CRESTOR) tablet 20 mg  20 mg Oral Daily Ward, Kristen N, DO   20 mg at 04/06/23 0920   sertraline (ZOLOFT) tablet 150 mg  150 mg Oral  Daily Ward, Kristen N, DO   150 mg at 04/06/23 0920   Current Outpatient Medications  Medication Sig Dispense Refill   acetaminophen (TYLENOL) 500 MG tablet Take 1,000 mg by mouth every 6 (six) hours as needed for moderate pain (pain score 4-6).     levETIRAcetam (KEPPRA) 1000 MG tablet Take 1 tablet (1,000 mg total) by mouth 2 (two) times daily. 180 tablet 3   levothyroxine (SYNTHROID) 112 MCG tablet Take 1 tablet (112 mcg total) by mouth daily at 6 (six) AM. 30 tablet 0   lidocaine (LIDODERM) 5 % Place 1 patch onto the skin every 12 (twelve) hours. Remove & Discard patch within 12 hours or as directed by MD 10 patch 0   memantine (NAMENDA) 10 MG tablet Take 1 tablet (10 mg total) by mouth 2 (two) times daily. 60 tablet 11   Midazolam (NAYZILAM) 5 MG/0.1ML SOLN Place 5 mg into the nose as needed. 2 each 2   Multiple Vitamin (MULTIVITAMIN) tablet Take 1 tablet by mouth daily.     QUEtiapine (SEROQUEL) 50 MG tablet Take 1 tablet (50 mg total) by mouth at bedtime. 90 tablet 0   rivastigmine (EXELON) 3 MG capsule Take 1 capsule (3 mg total) by mouth 2 (two) times daily. 60 capsule 11   rosuvastatin (CRESTOR) 20 MG tablet Take 1 tablet (20 mg total) by mouth daily. 90 tablet 3   sertraline (ZOLOFT) 100 MG tablet TAKE 1 & 1/2 (ONE & ONE-HALF) TABLETS BY MOUTH ONCE DAILY 135 tablet 0   [Paused] amLODipine (NORVASC) 5 MG tablet Take 1 tablet by mouth once daily (Patient not taking: Reported on 03/15/2023) 90 tablet 0   cefdinir (OMNICEF) 300 MG capsule Take 1 capsule (300 mg total) by mouth 2 (two) times daily. (Patient not taking: Reported on 04/04/2023) 14 capsule 0   Facility-Administered Medications Ordered in Other Encounters  Medication Dose Route Frequency Provider Last Rate Last Admin   denosumab (XGEVA) injection 120 mg  120 mg Subcutaneous Q30 days Creig Hines, MD   120 mg at 03/05/19 1525   heparin lock flush 100 UNIT/ML injection              Discharge Medications: Please see discharge  summary for a list of discharge medications.  Relevant Imaging Results:  Relevant Lab Results:   Additional Information SS #: [id number]  Codi Kertz E Elizette Shek, LCSW

## 2023-04-06 NOTE — ED Notes (Signed)
Patient is vol pending placement 

## 2023-04-06 NOTE — ED Notes (Signed)
 Lunch tray given to pt.

## 2023-04-06 NOTE — ED Notes (Signed)
Pt ambulates to bathroom with steady gait, no assistance needed.

## 2023-04-06 NOTE — ED Notes (Signed)
 Pt given dinner tray.

## 2023-04-06 NOTE — ED Notes (Signed)
 Pt ambulated to the bathroom and back to room without assistance. Pt has no other concerns or requests as this time.

## 2023-04-06 NOTE — ED Notes (Signed)
 Pt provided with breakfast tray.

## 2023-04-06 NOTE — ED Notes (Signed)
vol/toc placement.. 

## 2023-04-06 NOTE — TOC Initial Note (Addendum)
 Transition of Care [facility]) - Initial/Assessment Note    Patient Details  Name: Anne [name] MRN: [id number] Date of Birth: 11/15/1950  [facility] CM/SW Contact:    Liliana Cline, LCSW Phone Number: 04/06/2023, 3:08 PM  Clinical Narrative:                 Per chart review, patient has been psych cleared and needs memory care placement. CSW called and spoke to patient's daughter [name]. [name] confirms the family is seeking Memory Care placement for patient. She states she does not feel patient is safe to return home with her spouse due to her Dementia. [name] states she reapplied for [facility] yesterday 2/28. [name] states Financial Counseling was assisting with Medicaid on last admission and is agreeable to CSW reaching out to them for guidance - CSW sent message to [place] with [employer] requesting update/assistance.  CSW emailed list of Memory Care facilities in [place] to [place], [name] states she has also spoken to the [employer] who provided a list of Memory Care facilities. [name] states they do not have a preference in facility, they prefer [facility] but are open to anywhere that can meet the patient's needs.  [name] states a Child psychotherapist from [employer] has been involved and she is not sure if [facility] Grad is going to assist with finding placement.  [name] states patient cannot afford to private pay for placement until the Medicaid is approved.   CSW sent out referral in [facility] to [name] facilities.  Expected Discharge Plan: Memory Care Barriers to Discharge: Continued Medical Work up   Patient Goals and CMS Choice   CMS Medicare.gov Compare Post Acute Care list provided to:: Patient Represenative (must comment) Choice offered to / list presented to : Adult Children      Expected Discharge Plan and Services       Living arrangements for the past 2 months: Single Family Home                                       Prior Living Arrangements/Services Living arrangements for the past 2 months: Single Family Home Lives with:: Spouse Patient language and need for interpreter reviewed:: Yes        Need for Family Participation in Patient Care: Yes (Comment) Care giver support system in place?: Yes (comment) Current home services: DME Criminal Activity/Legal Involvement Pertinent to Current Situation/Hospitalization: No - Comment as needed  Activities of Daily Living      Permission Sought/Granted                  Emotional Assessment       Orientation: : Fluctuating Orientation (Suspected and/or reported Sundowners) Alcohol / Substance Use: Not Applicable Psych Involvement: No (comment)  Admission diagnosis:  Psychiatric Evaluation Patient Active Problem List   Diagnosis Date Noted   Dementia with agitation (HCC) 04/05/2023   Suicidal ideation 03/24/2023   Altered mental status 03/15/2023   Abnormal urinalysis 03/15/2023   Weakness 02/26/2023   Acute urinary retention 02/05/2023   Seizure (HCC) 01/22/2023   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/22/2023   Tremor 11/23/2022   Urinary urgency 03/26/2022   Sore throat 03/07/2022   Seizure-like activity (HCC) 02/08/2022   Seborrheic keratosis 09/20/2021   Closed fracture of nasal bones 07/26/2021   Falls frequently 07/26/2021   Head injury 07/07/2021  Left hip pain 07/07/2021   Lipoma 07/07/2021   Slurred speech 06/07/2021   Subdural hematoma (HCC) 01/23/2021   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   Aortic atherosclerosis (HCC) 08/24/2020   Dementia with behavioral disturbance (HCC) 03/14/2020   Left shoulder pain 07/22/2019   Hypothyroidism 02/16/2019   Goals of care, counseling/discussion 08/06/2018   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 [name] - [address] [name] [address] Phone: [phone] Fax: [phone]  [facility] [address] [name] [address] Phone: [phone] Fax: [phone]  [facility] [address] [name] [address] Phone: [phone] Fax: [phone]     Social Drivers of Health (SDOH) Social History: SDOH Screenings   Food Insecurity: No Food Insecurity (02/20/2023)  Housing: Low Risk  (02/20/2023)  Transportation Needs: No Transportation Needs (02/20/2023)  Utilities: Not At Risk (02/20/2023)  Alcohol Screen: Low Risk  (02/22/2023)  Depression (PHQ2-9): High Risk (06/21/2022)  Financial Resource Strain: Low Risk  (02/22/2023)  Physical Activity: Inactive (02/22/2023)  Social Connections: Moderately Isolated (02/20/2023)  Stress: Patient Unable To Answer (02/22/2023)  Tobacco Use: Medium Risk (04/04/2023)  Health Literacy: Patient Unable To Answer (02/22/2023)   SDOH Interventions:     Readmission Risk Interventions     No data to display

## 2023-04-06 NOTE — ED Provider Notes (Signed)
 Emergency Medicine Observation Re-evaluation Note  Physical Exam   BP (!) 145/79 (BP Location: Right Arm)   Pulse 76   Temp 98 F (36.7 C) (Oral)   Resp 18   Ht 5\' 5"  (1.651 m)   Wt 70.3 kg   SpO2 96%   BMI 25.79 kg/m   Patient appears in no acute distress.  ED Course / MDM   No reported events during my shift at the time of this note.   Pt is awaiting dispo from consultants   Pilar Jarvis MD    Pilar Jarvis, MD 04/06/23 [phone]

## 2023-04-07 NOTE — ED Notes (Signed)
 Patients visitor (husband) left at this time

## 2023-04-07 NOTE — ED Notes (Signed)
 VOL  TOC  PLACEMENT

## 2023-04-07 NOTE — ED Notes (Addendum)
 Pt dinner tray left at bedside. Pt asleep at this moment.

## 2023-04-07 NOTE — ED Notes (Signed)
Pt given snack at this time  

## 2023-04-07 NOTE — ED Notes (Signed)
Patient sitting up eating breakfast tray 

## 2023-04-07 NOTE — ED Provider Notes (Signed)
 Emergency Medicine Observation Re-evaluation Note  Anne [name] is a 73 y.o. female, seen on rounds today.  Pt initially presented to the ED for complaints of Psychiatric Evaluation  Currently, the patient is resting comfortably.  Physical Exam  BP 116/62 (BP Location: Right Arm)   Pulse 74   Temp 97.7 F (36.5 C) (Oral)   Resp 16   Ht 5\' 5"  (1.651 m)   Wt 70.3 kg   SpO2 96%   BMI 25.79 kg/m  General: No acute distress Cardiac: Well-perfused extremities Lungs: No respiratory distress Psych: Appropriate mood and affect  ED Course / MDM  EKG:   I have reviewed the labs performed to date as well as medications administered while in observation.  Recent changes in the last 24 hours include none.  Plan  Current plan is for placement.   Merwyn Katos, MD 04/07/23 [phone]

## 2023-04-07 NOTE — ED Notes (Signed)
 Hospital meal provided, pt tolerated w/o complaints.  Waste discarded appropriately.

## 2023-04-08 ENCOUNTER — Telehealth: Payer: Self-pay | Admitting: Neurosurgery

## 2023-04-08 MED ORDER — QUETIAPINE FUMARATE 25 MG PO TABS: 50.0000 mg | ORAL_TABLET | Freq: Once | ORAL | Status: DC

## 2023-04-08 NOTE — ED Notes (Signed)
vol/toc placement.. 

## 2023-04-08 NOTE — ED Notes (Signed)
Husband in to visit patient.

## 2023-04-08 NOTE — ED Provider Notes (Signed)
 Emergency Medicine Observation Re-evaluation Note  Anne [name] is a 73 y.o. female, seen on rounds today.  Pt initially presented to the ED for complaints of Psychiatric Evaluation  Currently, the patient is no acute distress. Resting   Physical Exam  Blood pressure 112/60, pulse 75, temperature 98.1 F (36.7 C), temperature source Oral, resp. rate 16, height 5\' 5"  (1.651 m), weight 70.3 kg, SpO2 99%.  Physical Exam General: No apparent distress Pulm: Normal WOB Psych: resting     ED Course / MDM     I have reviewed the labs performed to date as well as medications administered while in observation.  Recent changes in the last 24 hours include seroquel  Plan   Current plan is to continue to wait for SW Pt with some increasing agitation so additional dose of seroquel was given  Patient is not under full IVC at this time.   Concha Se, MD 04/08/23 [phone]

## 2023-04-08 NOTE — Telephone Encounter (Signed)
 Okay to add her on for a telephone visit on Wednesday?

## 2023-04-08 NOTE — Telephone Encounter (Signed)
 Patient's husband, [name] stopped by the office and is requesting a call from Dr.Smith. He states that the patient has bleeding on the brain and the most recent CT showed that the bleeding has gotten worse. He states he is not getting any answers from the hospital and would like Dr. Katrinka Blazing to review the information. Please advise.

## 2023-04-08 NOTE — ED Notes (Signed)
Pt given PM snack at this time.  

## 2023-04-08 NOTE — ED Notes (Signed)
 Pt up banging on door, this tech went to door and pt stated that she needed to use the restroom. This tech opened door for pt and walked pt to the restroom. Pt is back in bed now and does not have any other needs at the moment.

## 2023-04-08 NOTE — TOC Progression Note (Signed)
 Transition of Care [facility]) - Progression Note    Patient Details  Name: Anne [name] MRN: [id number] Date of Birth: 02-Oct-1950  [facility] CM/SW Contact  Margarito Liner, LCSW Phone Number: 04/08/2023, 4:10 PM  Clinical Narrative:   CSW left voicemail for DSS social worker.  Expected Discharge Plan: Memory Care Barriers to Discharge: Continued Medical Work up  Expected Discharge Plan and Services       Living arrangements for the past 2 months: Single Family Home                                       Social Determinants of Health (SDOH) Interventions SDOH Screenings   Food Insecurity: No Food Insecurity (02/20/2023)  Housing: Low Risk  (02/20/2023)  Transportation Needs: No Transportation Needs (02/20/2023)  Utilities: Not At Risk (02/20/2023)  Alcohol Screen: Low Risk  (02/22/2023)  Depression (PHQ2-9): High Risk (06/21/2022)  Financial Resource Strain: Low Risk  (02/22/2023)  Physical Activity: Inactive (02/22/2023)  Social Connections: Moderately Isolated (02/20/2023)  Stress: Patient Unable To Answer (02/22/2023)  Tobacco Use: Medium Risk (04/04/2023)  Health Literacy: Patient Unable To Answer (02/22/2023)    Readmission Risk Interventions     No data to display

## 2023-04-09 MED ORDER — ZIPRASIDONE MESYLATE 20 MG IM SOLR
10.0000 mg | Freq: Four times a day (QID) | INTRAMUSCULAR | Status: DC | PRN
  Administered 2023-04-09 – 2023-04-15 (×2): 10 mg via INTRAMUSCULAR
  Filled 2023-04-09 (×2): qty 20

## 2023-04-09 NOTE — ED Notes (Signed)
 Attempted to call husband at both mobile and home phone number in chart to give update on patient and visiting policy.

## 2023-04-09 NOTE — ED Notes (Signed)
 Pt's husband to front desk requesting to visit pt. Advised husband that he would be able to visit in a few minutes after pt advocate was available d/t pt visitation required to be supervised and limited to 15 minutes. Pt husband began Stage manager, stating that "nurses dont do shit, doctors dont do shit, social worker doesn't do shit". A few minutes later pt's husband began asking other staff for assistance, this RN informed husband that I had already assisted him and we were waiting for advocate to assist with visit. Pt continued with disruptive behavior. Discussed situation with primary RN Connye Burkitt and decided it was not it the pt's best interest for husband to visit at this time d/t behavior. Advised husband that he could not visit at this time d/t behavior, he began yelling at this RN that he would be getting a Clinical research associate.

## 2023-04-09 NOTE — TOC Progression Note (Signed)
 Transition of Care [facility]) - Progression Note    Patient Details  Name: Anne [name] MRN: [id number] Date of Birth: 1950/05/24  [facility] CM/SW Contact  Truddie Hidden, RN Phone Number: 04/09/2023, 1:12 PM  Clinical Narrative:    No current bed offers.  Spoke with Anell Barr from [facility] DSS [phone] to inquire about placement. There have been no advancements in placement at this time.    Expected Discharge Plan: Memory Care Barriers to Discharge: Continued Medical Work up  Expected Discharge Plan and Services       Living arrangements for the past 2 months: Single Family Home                                       Social Determinants of Health (SDOH) Interventions SDOH Screenings   Food Insecurity: No Food Insecurity (02/20/2023)  Housing: Low Risk  (02/20/2023)  Transportation Needs: No Transportation Needs (02/20/2023)  Utilities: Not At Risk (02/20/2023)  Alcohol Screen: Low Risk  (02/22/2023)  Depression (PHQ2-9): High Risk (06/21/2022)  Financial Resource Strain: Low Risk  (02/22/2023)  Physical Activity: Inactive (02/22/2023)  Social Connections: Moderately Isolated (02/20/2023)  Stress: Patient Unable To Answer (02/22/2023)  Tobacco Use: Medium Risk (04/04/2023)  Health Literacy: Patient Unable To Answer (02/22/2023)    Readmission Risk Interventions     No data to display

## 2023-04-09 NOTE — ED Notes (Signed)
 Patient currently on the phone with her husband. Husband informed by this RN that pt has been moved from ED quad to ED room 25.

## 2023-04-09 NOTE — ED Notes (Signed)
 Pt given sandwich tray as pt keeps getting up to walk out of room asking if her meal trays would be brought to her new room vs her old room. Pt asked by this RN if she would like a snack and pt replied "oh yes"

## 2023-04-09 NOTE — ED Notes (Signed)
 Patient's husband at the bedside to visit.

## 2023-04-09 NOTE — ED Notes (Signed)
 This tech obtained vital signs on pt.

## 2023-04-09 NOTE — ED Notes (Signed)
Patient given a dinner tray.

## 2023-04-09 NOTE — ED Notes (Signed)
 Patient provided snack at appropriate snack time.  Pt consumed 100% of snack provided, tolerated well w/o complaints   Trash disposted of appropriately by patient.

## 2023-04-09 NOTE — ED Notes (Signed)
 Hospital meal provided.  100% consumed, pt tolerated w/o complaints.  Waste discarded appropriately.

## 2023-04-09 NOTE — ED Notes (Signed)
 Pt. Alert and but disoriented to place, time, and situation.  Patient warm and dry, in no distress. Pt. Denies SI, HI, and AVH. Pt showing signs is of sundowning. Patient is redirectable.  Pt. Encouraged to let nursing staff know of any concerns or needs.   ENVIRONMENTAL ASSESSMENT Potentially harmful objects out of patient reach: Yes.   Personal belongings secured: Yes.   Patient dressed in hospital provided attire only: Yes.   Plastic bags out of patient reach: Yes.   Patient care equipment (cords, cables, call bells, lines, and drains) shortened, removed, or accounted for: Yes.   Equipment and supplies removed from bottom of stretcher: Yes.   Potentially toxic materials out of patient reach: Yes.   Sharps container removed or out of patient reach: Yes.

## 2023-04-09 NOTE — ED Notes (Signed)
 Pt's husband inquired about the patients plan. This RN informed husband that the patient is still waiting to be placed, and that sometimes it is a very timely process. The patient's information is sent out to multiple places, and the patient has to wait until a facility responds to take the patient. Pt's husband voiced understanding.

## 2023-04-09 NOTE — ED Notes (Signed)
 This rn attempted to contact patient's husband with no answer. Unable to leave voicemail.

## 2023-04-09 NOTE — ED Notes (Signed)
 Pt up trying to walk out room, states "I have to go outside." When told she can't go outside pt states "well I'm leaving even if I have to run out of here." Pt becoming more agitated when told she can't just go outside or walk around the ER.

## 2023-04-09 NOTE — ED Notes (Addendum)
 Patient ambulatory to throw away dinner trash in room, Pt struck a scraped a scabbed area on her left arm which began to bleed. Bleeding controlled, and dressing applied to area.

## 2023-04-10 ENCOUNTER — Ambulatory Visit: Admitting: Neurosurgery

## 2023-04-10 DIAGNOSIS — S065XAD Traumatic subdural hemorrhage with loss of consciousness status unknown, subsequent encounter: Secondary | ICD-10-CM | POA: Diagnosis not present

## 2023-04-10 NOTE — Consult Note (Signed)
 Consulting Department:  ED  Primary Physician:  Glori Luis, MD (Inactive)  Chief Complaint: Subdural hematoma  History of Present Illness: 04/10/2023 Anne [name] is a 73 y.o. female who presents with the chief complaint of subdural hematoma.  She has been followed for a subdural hematoma with cognitive changes in the setting of dementia and seizures.  She unfortunately developed a significant UTI which caused a mental status change requiring hospitalization secondary to agitation and altered sensorium.  She did hit her head slightly, she has not had any new deficits.  No witnessed new seizures.  Last time I discussed this with her neurologist they felt that it would be too high risk to perform a subdural hematoma evacuation especially when her seizures are well-controlled.  Review of Systems:  A 10 point review of systems is negative, except for the pertinent positives and negatives detailed in the HPI.  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   Metastatic bone cancer    Seizures (HCC)    Subdural hematoma (HCC)     Past Surgical History: Past Surgical History:  Procedure Laterality Date   APPENDECTOMY  1971   CHOLECYSTECTOMY  1971   COLONOSCOPY WITH PROPOFOL N/A 11/15/2016   Procedure: COLONOSCOPY WITH PROPOFOL;  Surgeon: Wyline Mood, MD;  Location: [facility] ENDOSCOPY;  Service: Gastroenterology;  Laterality: N/A;   ENDOBRONCHIAL ULTRASOUND Right 07/30/2018   Procedure: ENDOBRONCHIAL ULTRASOUND;  Surgeon: Shane Crutch, MD;  Location: [facility] ORS;  Service: Pulmonary;  Laterality: Right;   ESOPHAGOGASTRODUODENOSCOPY (EGD) WITH PROPOFOL N/A 01/07/2017   Procedure: ESOPHAGOGASTRODUODENOSCOPY (EGD) WITH  PROPOFOL;  Surgeon: Wyline Mood, MD;  Location: [facility] ENDOSCOPY;  Service: Gastroenterology;  Laterality: N/A;   LAPAROSCOPY N/A 03/01/2017   Procedure: LAPAROSCOPY DIAGNOSTIC;  Surgeon: Earline Mayotte, MD;  Location: [facility] ORS;  Service: General;  Laterality: N/A;   PORTA CATH INSERTION N/A 08/14/2018   Procedure: PORTA CATH INSERTION;  Surgeon: Annice Needy, MD;  Location: [facility] INVASIVE CV LAB;  Service: Cardiovascular;  Laterality: N/A;   TONSILECTOMY, ADENOIDECTOMY, BILATERAL MYRINGOTOMY AND TUBES  1955   TONSILLECTOMY     VENTRAL HERNIA REPAIR N/A 03/01/2017   10 x 14 CM Ventralight ST mesh, intraperitoneal location.    VENTRAL HERNIA REPAIR N/A 03/01/2017   Procedure: HERNIA REPAIR VENTRAL ADULT;  Surgeon: Earline Mayotte, MD;  Location: [facility] ORS;  Service: General;  Laterality: N/A;    Allergies: Allergies as of 04/04/2023 - Review Complete 04/04/2023  Allergen Reaction Noted   Bee venom Swelling 03/01/2017    Medications:  Current Facility-Administered Medications:    levETIRAcetam (KEPPRA) tablet 1,000 mg, 1,000 mg, Oral, BID, Ward, Kristen N, DO, 1,000 mg at 04/10/23 1030   levothyroxine (SYNTHROID) tablet 112 mcg, 112 mcg, Oral, Q0600, Ward, Kristen N, DO, 112 mcg at 04/10/23 0841   memantine (NAMENDA) tablet 10 mg, 10 mg, Oral, BID, Ward, Kristen N, DO, 10 mg at 04/10/23 1029   QUEtiapine (SEROQUEL) tablet 12.5 mg, 12.5 mg, Oral, QPC breakfast, Ward, Kristen N, DO, 12.5 mg at 04/10/23 0839   QUEtiapine (SEROQUEL) tablet 50 mg, 50 mg, Oral, QHS, Ward, Kristen N, DO, 50 mg at 04/09/23 2109   QUEtiapine (SEROQUEL) tablet 50  mg, 50 mg, Oral, Once, Concha Se, MD   rivastigmine (EXELON) capsule 3 mg, 3 mg, Oral, BID, Ward, Kristen N, DO, 3 mg at 04/10/23 1029   rosuvastatin (CRESTOR) tablet 20 mg, 20 mg, Oral, Daily, Ward, Kristen N, DO, 20 mg at 04/10/23 1030   sertraline (ZOLOFT) tablet 150 mg, 150 mg, Oral, Daily, Ward, Kristen N, DO, 150 mg at 04/10/23 1029   ziprasidone  (GEODON) injection 10 mg, 10 mg, Intramuscular, Q6H PRN, Merwyn Katos, MD, 10 mg at 04/09/23 2029  Current Outpatient Medications:    acetaminophen (TYLENOL) 500 MG tablet, Take 1,000 mg by mouth every 6 (six) hours as needed for moderate pain (pain score 4-6)., Disp: , Rfl:    levETIRAcetam (KEPPRA) 1000 MG tablet, Take 1 tablet (1,000 mg total) by mouth 2 (two) times daily., Disp: 180 tablet, Rfl: 3   levothyroxine (SYNTHROID) 112 MCG tablet, Take 1 tablet (112 mcg total) by mouth daily at 6 (six) AM., Disp: 30 tablet, Rfl: 0   lidocaine (LIDODERM) 5 %, Place 1 patch onto the skin every 12 (twelve) hours. Remove & Discard patch within 12 hours or as directed by MD, Disp: 10 patch, Rfl: 0   memantine (NAMENDA) 10 MG tablet, Take 1 tablet (10 mg total) by mouth 2 (two) times daily., Disp: 60 tablet, Rfl: 11   Midazolam (NAYZILAM) 5 MG/0.1ML SOLN, Place 5 mg into the nose as needed., Disp: 2 each, Rfl: 2   Multiple Vitamin (MULTIVITAMIN) tablet, Take 1 tablet by mouth daily., Disp: , Rfl:    QUEtiapine (SEROQUEL) 50 MG tablet, Take 1 tablet (50 mg total) by mouth at bedtime., Disp: 90 tablet, Rfl: 0   rivastigmine (EXELON) 3 MG capsule, Take 1 capsule (3 mg total) by mouth 2 (two) times daily., Disp: 60 capsule, Rfl: 11   rosuvastatin (CRESTOR) 20 MG tablet, Take 1 tablet (20 mg total) by mouth daily., Disp: 90 tablet, Rfl: 3   sertraline (ZOLOFT) 100 MG tablet, TAKE 1 & 1/2 (ONE & ONE-HALF) TABLETS BY MOUTH ONCE DAILY, Disp: 135 tablet, Rfl: 0   [Paused] amLODipine (NORVASC) 5 MG tablet, Take 1 tablet by mouth once daily (Patient not taking: Reported on 03/15/2023), Disp: 90 tablet, Rfl: 0   cefdinir (OMNICEF) 300 MG capsule, Take 1 capsule (300 mg total) by mouth 2 (two) times daily. (Patient not taking: Reported on 04/04/2023), Disp: 14 capsule, Rfl: 0  Facility-Administered Medications Ordered in Other Encounters:    denosumab (XGEVA) injection 120 mg, 120 mg, Subcutaneous, Q30 days, Creig Hines, MD, 120 mg at 03/05/19 1525   heparin lock flush 100 UNIT/ML injection, , , ,    Social History: Social History   Tobacco Use   Smoking status: Former    Current packs/day: 0.00    Average packs/day: 2.0 packs/day for 50.0 years (100.0 ttl pk-yrs)    Types: Cigarettes, E-cigarettes    Start date: 02/06/1962    Quit date: 02/07/2012    Years since quitting: 11.1   Smokeless tobacco: Never  Vaping Use   Vaping status: Former   Start date: 10/06/2013   Devices: uses no liquid  Substance Use Topics   Alcohol use: No    Alcohol/week: 0.0 standard drinks of alcohol   Drug use: No    Family Medical History: Family History  Problem Relation Age of Onset   Hypertension Mother    Ovarian cancer Mother 60   Dementia Mother    Heart disease Father    Stroke  Father    Seizures Father    Ovarian cancer Sister        ? dx cancer had hyst.   Breast cancer Neg Hx     Physical Examination: Vitals:   04/09/23 0700 04/09/23 1950  BP: (!) 114/91 (!) 144/65  Pulse: 68 77  Resp: 18 18  Temp: 98.6 F (37 C) 98 F (36.7 C)  SpO2: 99% 92%     General: Patient is well developed, well nourished, calm, collected, and in no apparent distress.  NEUROLOGICAL:  General: Intermittently agitated Awake, and interactive, tangential.  Difficult to maintain full conversation. Moving all 4 extremities at least antigravity.  Unable to fully cooperate in a motor examination.  Imaging: No results found.  Labs:    Latest Ref Rng & Units 04/04/2023   12:36 PM 03/23/2023    1:48 PM 03/15/2023    8:41 AM  CBC  WBC 4.0 - 10.5 K/uL 6.2  5.0  6.8   Hemoglobin 12.0 - 15.0 g/dL 16.1  09.6  04.5   Hematocrit 36.0 - 46.0 % 40.0  40.1  39.1   Platelets 150 - 400 K/uL 167  178  168.0       Latest Ref Rng & Units 04/04/2023   12:36 PM 03/23/2023    1:48 PM 03/15/2023    8:41 AM  BMP  Glucose 70 - 99 mg/dL 409  91  68   BUN 8 - 23 mg/dL 25  16  18    Creatinine 0.44 - 1.00 mg/dL 8.11  9.14  7.82    Sodium 135 - 145 mmol/L 141  142  145   Potassium 3.5 - 5.1 mmol/L 3.4  4.1  3.6   Chloride 98 - 111 mmol/L 103  103  103   CO2 22 - 32 mmol/L 27  29  28    Calcium 8.9 - 10.3 mg/dL 9.4  9.5  9.4         Assessment and Plan: Anne [name] is a pleasant 73 y.o. female with a history of a left-sided subdural hematoma.  She was recently admitted for a UTI with complications including altered sensorium, agitation, hallucinations, and worsening mental status.  She was admitted to the emergency department.  Had been treated for her infection.  She has been here for the past 4 to 6 days.  She did have a slight bump in her head prior to admission.  I have discussed her case with her neurologist who states that in the setting of well-controlled seizures he feels like the subdural hematoma evacuation would be overly risky given its side effect profile and complication profile.  She has a full wakefulness without any evidence of somnolence or hemiparesis.  She is moving all 4 extremities.  She has been to have a visit with me today but I came over to see her in the emergency department and she was admitted.  She was admitted on the 27th.  At this point I continue to think that she would not benefit from a subdural hematoma evacuation, especially given the risk profile, and her well-controlled seizures.  She has been treated conservatively.  Could consider MMA embolization at some point.  Spent a total of 20 minutes discussing the care with the patient, her family, and coordination with the team.  I let the family know I did reach out to the medical team   Lovenia Kim, MD/MSCR Dept. of Neurosurgery

## 2023-04-10 NOTE — ED Notes (Signed)
 Pt provided with dinner tray.

## 2023-04-10 NOTE — NC FL2 (Signed)
 [place] MEDICAID FL2 LEVEL OF CARE FORM     IDENTIFICATION  Patient Name: Anne [name] Birthdate: [birth year] Sex: female Admission Date (Current Location): 04/04/2023  [place] and [place] Number:  Chiropodist and Address:  [facility], [address] [place], [name] [address]      Provider Number: [id number]  Attending Physician Name and Address:  No att. providers found  Relative Name and Phone Number:       Current Level of Care: Hospital Recommended Level of Care: Memory Care Prior Approval Number:    Date Approved/Denied:   PASRR Number:    Discharge Plan: Other (Comment) (Memory Care)    Current Diagnoses: Patient Active Problem List   Diagnosis Date Noted   Dementia with agitation (HCC) 04/05/2023   Suicidal ideation 03/24/2023   Altered mental status 03/15/2023   Abnormal urinalysis 03/15/2023   Weakness 02/26/2023   Acute urinary retention 02/05/2023   Seizure (HCC) 01/22/2023   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/22/2023   Tremor 11/23/2022   Urinary urgency 03/26/2022   Sore throat 03/07/2022   Seizure-like activity (HCC) 02/08/2022   Seborrheic keratosis 09/20/2021   Closed fracture of nasal bones 07/26/2021   Falls frequently 07/26/2021   Head injury 07/07/2021   Left hip pain 07/07/2021   Lipoma 07/07/2021   Slurred speech 06/07/2021   Subdural hematoma (HCC) 01/23/2021   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   Aortic atherosclerosis (HCC) 08/24/2020   Dementia with behavioral disturbance (HCC) 03/14/2020   Left shoulder pain 07/22/2019   Hypothyroidism 02/16/2019   Goals of care, counseling/discussion 08/06/2018   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), in remission, 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   Skin tear of forearm without complication, initial encounter 12/06/2017   Bruising 12/06/2017   Overweight 08/05/2017   Anemia  01/23/2017   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 No combativeness.)  (History of seizure) Continent Diet (Regular)  AMBULATORY STATUS COMMUNICATION OF NEEDS Skin   Limited Assist Verbally Normal                       Personal Care Assistance Level of Assistance  Bathing, Feeding, Dressing Bathing Assistance: Limited assistance Feeding assistance: Limited assistance Dressing Assistance: Limited assistance     Functional Limitations Info  Sight, Hearing, Speech Sight Info: Adequate Hearing Info: Adequate Speech Info: Adequate    SPECIAL CARE FACTORS FREQUENCY                       Contractures Contractures Info: Not present    Additional Factors Info  Code Status, Allergies Code Status Info: DNR Allergies Info: Bee venom           Current Medications (04/10/2023):  This is the current hospital active medication list Current Facility-Administered Medications  Medication Dose Route Frequency Provider Last Rate Last Admin   levETIRAcetam (KEPPRA) tablet 1,000 mg  1,000 mg Oral BID Ward, Kristen N, DO   1,000 mg at 04/10/23 1030  levothyroxine (SYNTHROID) tablet 112 mcg  112 mcg Oral Q0600 Ward, Kristen N, DO   112 mcg at 04/10/23 0841   memantine (NAMENDA) tablet 10 mg  10 mg Oral BID Ward, Kristen N, DO   10 mg at 04/10/23 1029   QUEtiapine (SEROQUEL) tablet 12.5 mg  12.5 mg Oral QPC breakfast Ward, Kristen N, DO   12.5 mg at 04/10/23 1610   QUEtiapine (SEROQUEL) tablet 50 mg  50 mg Oral QHS Ward, Kristen N, DO   50 mg at 04/09/23 2109    QUEtiapine (SEROQUEL) tablet 50 mg  50 mg Oral Once Concha Se, MD       rivastigmine (EXELON) capsule 3 mg  3 mg Oral BID Ward, Kristen N, DO   3 mg at 04/10/23 1029   rosuvastatin (CRESTOR) tablet 20 mg  20 mg Oral Daily Ward, Kristen N, DO   20 mg at 04/10/23 1030   sertraline (ZOLOFT) tablet 150 mg  150 mg Oral Daily Ward, Kristen N, DO   150 mg at 04/10/23 1029   ziprasidone (GEODON) injection 10 mg  10 mg Intramuscular Q6H PRN Merwyn Katos, MD   10 mg at 04/09/23 2029   Current Outpatient Medications  Medication Sig Dispense Refill   acetaminophen (TYLENOL) 500 MG tablet Take 1,000 mg by mouth every 6 (six) hours as needed for moderate pain (pain score 4-6).     levETIRAcetam (KEPPRA) 1000 MG tablet Take 1 tablet (1,000 mg total) by mouth 2 (two) times daily. 180 tablet 3   levothyroxine (SYNTHROID) 112 MCG tablet Take 1 tablet (112 mcg total) by mouth daily at 6 (six) AM. 30 tablet 0   lidocaine (LIDODERM) 5 % Place 1 patch onto the skin every 12 (twelve) hours. Remove & Discard patch within 12 hours or as directed by MD 10 patch 0   memantine (NAMENDA) 10 MG tablet Take 1 tablet (10 mg total) by mouth 2 (two) times daily. 60 tablet 11   Midazolam (NAYZILAM) 5 MG/0.1ML SOLN Place 5 mg into the nose as needed. 2 each 2   Multiple Vitamin (MULTIVITAMIN) tablet Take 1 tablet by mouth daily.     QUEtiapine (SEROQUEL) 50 MG tablet Take 1 tablet (50 mg total) by mouth at bedtime. 90 tablet 0   rivastigmine (EXELON) 3 MG capsule Take 1 capsule (3 mg total) by mouth 2 (two) times daily. 60 capsule 11   rosuvastatin (CRESTOR) 20 MG tablet Take 1 tablet (20 mg total) by mouth daily. 90 tablet 3   sertraline (ZOLOFT) 100 MG tablet TAKE 1 & 1/2 (ONE & ONE-HALF) TABLETS BY MOUTH ONCE DAILY 135 tablet 0   [Paused] amLODipine (NORVASC) 5 MG tablet Take 1 tablet by mouth once daily (Patient not taking: Reported on 03/15/2023) 90 tablet 0   cefdinir (OMNICEF) 300 MG capsule Take 1 capsule (300 mg  total) by mouth 2 (two) times daily. (Patient not taking: Reported on 04/04/2023) 14 capsule 0   Facility-Administered Medications Ordered in Other Encounters  Medication Dose Route Frequency Provider Last Rate Last Admin   denosumab (XGEVA) injection 120 mg  120 mg Subcutaneous Q30 days Creig Hines, MD   120 mg at 03/05/19 1525   heparin lock flush 100 UNIT/ML injection              Discharge Medications: Please see discharge summary for a list of discharge medications.  Relevant Imaging Results:  Relevant Lab Results:   Additional Information SS #: [id number]  Margarito Liner, LCSW

## 2023-04-10 NOTE — ED Notes (Signed)
 Patient given cola and ice cream.

## 2023-04-10 NOTE — Evaluation (Signed)
 Occupational Therapy Evaluation Patient Details Name: Anne [name] MRN: [id number] DOB: Aug 09, 1950 Today's Date: 04/10/2023   History of Present Illness   73 y.o. female, seen on rounds today.  Pt initially presented to the ED for complaints of behavioral evaluation and Altered Mental Status     Clinical Impressions Upon  entering the room, pt supine in bed with husband present in room. Pt with cognitive deficits at baseline. Pt lives with husband who assists with providing supervision and cuing for safety at home. Pt is able to wash and dress self with assist as needed at baseline. Pt stands and ambulates to bathroom for toileting with supervision - min guard. Pt needing cuing for sequencing of routine self care tasks and returns to room at end of session. Husband agrees that pt is close to baseline physically at this time. Cognition appears to be getting better per his report as well. Pt does not need skilled OT intervention at this time. OT to complete orders at this time.      If plan is discharge home, recommend the following:   Supervision due to cognitive status;A little help with walking and/or transfers;A little help with bathing/dressing/bathroom;Assistance with cooking/housework;Assist for transportation;Help with stairs or ramp for entrance     Functional Status Assessment   Patient has not had a recent decline in their functional status     Equipment Recommendations   None recommended by OT      Precautions/Restrictions   Precautions Precautions: Fall     Mobility Bed Mobility Overal bed mobility: Needs Assistance Bed Mobility: Rolling, Supine to Sit, Sit to Supine Rolling: Supervision   Supine to sit: Supervision Sit to supine: Supervision        Transfers Overall transfer level: Needs assistance Equipment used: 1 person hand held assist Transfers: Sit to/from Stand Sit to Stand: Supervision                  Balance Overall balance  assessment: Needs assistance Sitting-balance support: Feet supported, Bilateral upper extremity supported Sitting balance-Leahy Scale: Good     Standing balance support: During functional activity, Single extremity supported Standing balance-Leahy Scale: Fair                             ADL either performed or assessed with clinical judgement   ADL Overall ADL's : Needs assistance/impaired                         Toilet Transfer: Supervision/safety;Contact guard assist   Toileting- Clothing Manipulation and Hygiene: Supervision/safety;Contact guard assist       Functional mobility during ADLs: Contact guard assist;Supervision/safety       Vision Patient Visual Report: No change from baseline              Pertinent Vitals/Pain Pain Assessment Pain Assessment: Faces Faces Pain Scale: No hurt     Extremity/Trunk Assessment Upper Extremity Assessment Upper Extremity Assessment: Overall WFL for tasks assessed   Lower Extremity Assessment Lower Extremity Assessment: Overall WFL for tasks assessed       Communication Communication Communication: Impaired Factors Affecting Communication: Hearing impaired   Cognition Arousal: Alert Behavior During Therapy: Restless, Impulsive Cognition: History of cognitive impairments             OT - Cognition Comments: dementia at baseline  Following commands: Intact       Cueing  General Comments   Cueing Techniques: Verbal cues;Visual cues      Exercises     Shoulder Instructions      Home Living Family/patient expects to be discharged to:: Private residence Living Arrangements: Spouse/significant other Available Help at Discharge: Family;Available 24 hours/day Type of Home: House       Home Layout: One level     Bathroom Shower/Tub: Sponge bathes at baseline         Home Equipment: Cane - single Librarian, academic (2 wheels)          Prior  Functioning/Environment Prior Level of Function : History of Falls (last six months)               ADLs Comments: Spouse reports he assists with supervision and cuing as needed for safety but pt physically able to perform self care tasks herself. Pt was using RW for mobility but most recently not using it and husband holding her arm at times.            OT Goals(Current goals can be found in the care plan section)   Acute Rehab OT Goals Patient Stated Goal: to go home OT Goal Formulation: With patient Time For Goal Achievement: 04/10/23 Potential to Achieve Goals: Fair   OT Frequency:          AM-PAC OT "6 Clicks" Daily Activity     Outcome Measure Help from another person eating meals?: A Little Help from another person taking care of personal grooming?: A Little Help from another person toileting, which includes using toliet, bedpan, or urinal?: A Little Help from another person bathing (including washing, rinsing, drying)?: A Little Help from another person to put on and taking off regular upper body clothing?: A Little Help from another person to put on and taking off regular lower body clothing?: A Little 6 Click Score: 18   End of Session Nurse Communication: Mobility status  Activity Tolerance: Patient tolerated treatment well Patient left: in bed;with call bell/phone within reach;with bed alarm set                   Time: 1349-1409 OT Time Calculation (min): 20 min Charges:  OT General Charges $OT Visit: 1 Visit OT Evaluation $OT Eval Low Complexity: 1 Low OT Treatments $Self Care/Home Management : 8-22 mins  Jackquline Denmark, MS, OTR/L , CBIS ascom [phone]  04/10/23, 2:36 PM

## 2023-04-10 NOTE — ED Notes (Signed)
 Patient ambulated around the unit with this RN. Patient returned to bed at this time.

## 2023-04-10 NOTE — ED Provider Notes (Signed)
-----------------------------------------   7:12 AM on 04/10/2023 -----------------------------------------   Blood pressure (!) 144/65, pulse 77, temperature 98 F (36.7 C), temperature source Oral, resp. rate 18, height 1.651 m (5\' 5" ), weight 70.3 kg, SpO2 92%.  The patient is calm and cooperative at this time.  There have been no acute events since the last update.  Awaiting disposition plan from [facility] team.   Loleta Rose, MD 04/10/23 [phone]

## 2023-04-10 NOTE — ED Notes (Signed)
Breakfast @ bedside. 

## 2023-04-10 NOTE — ED Notes (Signed)
 VOl/TOc

## 2023-04-10 NOTE — ED Notes (Signed)
 Fall risk interventions applied.

## 2023-04-10 NOTE — TOC Progression Note (Addendum)
 Transition of Care [facility]) - Progression Note    Patient Details  Name: Anne [name] MRN: [id number] Date of Birth: Mar 03, 1950  [facility] CM/SW Contact  Margarito Liner, LCSW Phone Number: 04/10/2023, 11:37 AM  Clinical Narrative:  Financial counselor will call daughter. CSW asked MD to consult PT and OT to see if patient qualifies for SNF.   4:21 pm: Received call from placement social worker at [facility]). CSW sent clinicals to her so she can send out referrals. She did agree that placement may be difficult since patient does not have Medicaid yet.  Expected Discharge Plan: Memory Care Barriers to Discharge: Continued Medical Work up  Expected Discharge Plan and Services       Living arrangements for the past 2 months: Single Family Home                                       Social Determinants of Health (SDOH) Interventions SDOH Screenings   Food Insecurity: No Food Insecurity (02/20/2023)  Housing: Low Risk  (02/20/2023)  Transportation Needs: No Transportation Needs (02/20/2023)  Utilities: Not At Risk (02/20/2023)  Alcohol Screen: Low Risk  (02/22/2023)  Depression (PHQ2-9): High Risk (06/21/2022)  Financial Resource Strain: Low Risk  (02/22/2023)  Physical Activity: Inactive (02/22/2023)  Social Connections: Moderately Isolated (02/20/2023)  Stress: Patient Unable To Answer (02/22/2023)  Tobacco Use: Medium Risk (04/04/2023)  Health Literacy: Patient Unable To Answer (02/22/2023)    Readmission Risk Interventions     No data to display

## 2023-04-11 NOTE — ED Notes (Signed)
 Pt ambulated to toilet changed gown and brief. New blankets placed on pt no complaints at this time will continue to monitor.

## 2023-04-11 NOTE — Evaluation (Signed)
 Physical Therapy Evaluation Patient Details Name: Anne [name] MRN: [id number] DOB: Oct 19, 1950 Today's Date: 04/11/2023  History of Present Illness  73 y.o. female, seen on rounds today.  Pt initially presented to the ED for complaints of behavioral evaluation and Altered Mental Status  Clinical Impression  Pt received in Semi-Fowler's position and agreeable to therapy.  Pt's husband in the room with pt upon arrival.  Pt did require some encouragement to work with therapy.  Pt mobilizes well without any physical assistance and is able to ambulate without the need of an AD.  Gait belt utilized for safety, however ultimately did not need for ambulation.  Pt able to ambulate around the ED with good technique and no further complications.  Pt does have baseline cognitive deficits at this point in time and husband supporting this is her normal.  Pt's husband is seeking some assistance for memory care/LTC placement at this time.  Pt returned to bed and was left with all needs met and call bell within reach.          If plan is discharge home, recommend the following: Direct supervision/assist for medications management;Direct supervision/assist for financial management;Supervision due to cognitive status   Can travel by private vehicle   Yes    Equipment Recommendations Other (comment) (TBD at next venue of care)  Recommendations for Other Services       Functional Status Assessment Patient has not had a recent decline in their functional status     Precautions / Restrictions Precautions Precautions: Fall Restrictions Weight Bearing Restrictions Per Provider Order: No      Mobility  Bed Mobility Overal bed mobility: Needs Assistance Bed Mobility: Rolling, Supine to Sit, Sit to Supine Rolling: Supervision   Supine to sit: Supervision Sit to supine: Supervision   General bed mobility comments: Pt with good technique and ability to perform transfer training without any physical  assistance.    Transfers Overall transfer level: Needs assistance Equipment used: 1 person hand held assist Transfers: Sit to/from Stand Sit to Stand: Supervision           General transfer comment: Pt with good transfer, no support needed.    Ambulation/Gait Ambulation/Gait assistance: Supervision Gait Distance (Feet): 300 Feet Assistive device: None Gait Pattern/deviations: Step-through pattern, Narrow base of support Gait velocity: slightly reduced.     General Gait Details: Pt with narrow BOS and at times, scissoring when walking, but majority of the time, pt with good functinoal gait pattern without any instability.  Stairs            Wheelchair Mobility     Tilt Bed    Modified Rankin (Stroke Patients Only)       Balance Overall balance assessment: Needs assistance Sitting-balance support: Feet supported, Bilateral upper extremity supported Sitting balance-Leahy Scale: Good     Standing balance support: During functional activity, Single extremity supported Standing balance-Leahy Scale: Fair                               Pertinent Vitals/Pain Pain Assessment Pain Assessment: No/denies pain    Home Living Family/patient expects to be discharged to:: Private residence Living Arrangements: Spouse/significant other Available Help at Discharge: Family;Available 24 hours/day Type of Home: House         Home Layout: One level Home Equipment: Cane - single Librarian, academic (2 wheels)      Prior Function Prior Level of Function : History of  Falls (last six months)               ADLs Comments: Spouse reports he assists with supervision and cuing as needed for safety but pt physically able to perform self care tasks herself. Pt was using RW for mobility but most recently not using it and husband holding her arm at times.     Extremity/Trunk Assessment   Upper Extremity Assessment Upper Extremity Assessment: Generalized  weakness    Lower Extremity Assessment Lower Extremity Assessment: Generalized weakness       Communication   Communication Communication: Impaired Factors Affecting Communication: Hearing impaired    Cognition Arousal: Alert Behavior During Therapy: Restless, Impulsive                             Following commands: Intact       Cueing Cueing Techniques: Verbal cues, Visual cues     General Comments      Exercises     Assessment/Plan    PT Assessment Patient does not need any further PT services  PT Problem List         PT Treatment Interventions      PT Goals (Current goals can be found in the Care Plan section)  Acute Rehab PT Goals Patient Stated Goal: to go home PT Goal Formulation: With patient Time For Goal Achievement: 04/25/23 Potential to Achieve Goals: Poor    Frequency       Co-evaluation               AM-PAC PT "6 Clicks" Mobility  Outcome Measure Help needed turning from your back to your side while in a flat bed without using bedrails?: None Help needed moving from lying on your back to sitting on the side of a flat bed without using bedrails?: None Help needed moving to and from a bed to a chair (including a wheelchair)?: None Help needed standing up from a chair using your arms (e.g., wheelchair or bedside chair)?: None Help needed to walk in hospital room?: None Help needed climbing 3-5 steps with a railing? : None 6 Click Score: 24    End of Session Equipment Utilized During Treatment: Gait belt Activity Tolerance: Patient tolerated treatment well Patient left: in bed;with call bell/phone within reach;with family/visitor present Nurse Communication: Mobility status PT Visit Diagnosis: Other abnormalities of gait and mobility (R26.89);History of falling (Z91.81);Muscle weakness (generalized) (M62.81)    Time: 1610-9604 PT Time Calculation (min) (ACUTE ONLY): 10 min   Charges:   PT Evaluation $PT Eval Low  Complexity: 1 Low PT Treatments $Therapeutic Activity: 8-22 mins PT General Charges $$ ACUTE PT VISIT: 1 Visit         Nolon Bussing, PT, DPT Physical Therapist - [facility]  04/11/23, 2:13 PM

## 2023-04-11 NOTE — TOC Progression Note (Addendum)
 Transition of Care [facility] - Progression Note    Patient Details  Name: Anne [name] MRN: [id number] Date of Birth: Jul 04, 1950  Transition of Care [facility]) CM/SW Contact  Margarito Liner, LCSW Phone Number: 04/11/2023, 10:15 AM  Clinical Narrative:  Chip Boer confirmed they received patient's referral but are unable to offer with Medicaid pending. RN said husband was asking for a call with an update. CSW called and left him a voicemail.  12:59 pm: CSW spoke to husband. He does not believe she will qualify for Medicaid. He still has rejection letter from January. He stated they own their home and two vehicles, one of which he still owes money on. There were two vehicles listed on the Medicaid paperwork that he has not owned in 10-20 years. CSW explained that DSS is also assisting with placement. Husband stated he feels comfortable caring for her at home but has been promised help many times and never received it. CSW updated APS social worker who stated that husband has gone to the police department multiple times for help but they have directed him to DSS. CSW tried calling DSS placement social worker but she is off today. CSW will try calling her again tomorrow. CSW updated financial counselor regarding financial information.  2:56 pm: Discussed case with financial counselor. She stated together, patient and husband are over the income to qualify for Medicaid but she may be able to qualify with a medical deductible. She is going to call to find out who the [facility] worker is.  Expected Discharge Plan: Memory Care Barriers to Discharge: Continued Medical Work up  Expected Discharge Plan and Services       Living arrangements for the past 2 months: Single Family Home                                       Social Determinants of Health (SDOH) Interventions SDOH Screenings   Food Insecurity: No Food Insecurity (02/20/2023)  Housing: Low Risk  (02/20/2023)  Transportation Needs: No  Transportation Needs (02/20/2023)  Utilities: Not At Risk (02/20/2023)  Alcohol Screen: Low Risk  (02/22/2023)  Depression (PHQ2-9): High Risk (06/21/2022)  Financial Resource Strain: Low Risk  (02/22/2023)  Physical Activity: Inactive (02/22/2023)  Social Connections: Moderately Isolated (02/20/2023)  Stress: Patient Unable To Answer (02/22/2023)  Tobacco Use: Medium Risk (04/04/2023)  Health Literacy: Patient Unable To Answer (02/22/2023)    Readmission Risk Interventions     No data to display

## 2023-04-11 NOTE — ED Notes (Signed)
 Pt is resting in bed with eyes closed no s/s of distress no complaints at this time. Will continue to monitor.

## 2023-04-11 NOTE — ED Notes (Signed)
 Pts brief changed and assisted with set up of lunch.

## 2023-04-11 NOTE — ED Provider Notes (Signed)
 Emergency Medicine Observation Re-evaluation Note  Physical Exam   BP (!) 140/86 (BP Location: Left Arm)   Pulse 90   Temp 97.8 F (36.6 C) (Oral)   Resp 17   Ht 5\' 5"  (1.651 m)   Wt 70.3 kg   SpO2 93%   BMI 25.79 kg/m   Patient appears in no acute distress.  ED Course / MDM   No reported events during my shift at the time of this note.   Pt is awaiting dispo from consultants   Pilar Jarvis MD    Pilar Jarvis, MD 04/11/23 [phone]

## 2023-04-11 NOTE — ED Notes (Signed)
 Pt ate 100% of breakfast assisted with cleanup. Will continue to monitor.

## 2023-04-11 NOTE — ED Notes (Signed)
 Pt provided with dinner tray. Pt sitting up eating

## 2023-04-11 NOTE — Progress Notes (Signed)
 PT Cancellation Note  Patient Details Name: Anne [name] MRN: [id number] DOB: 04/11/1950   Cancelled Treatment:    Reason Eval/Treat Not Completed: Patient declined, no reason specified.  Chart reviewed and attempted to see the pt.  Pt resting at this time and not easily awakened.  Will re-attempt at later date/time as medically appropriate.     Nolon Bussing, PT, DPT Physical Therapist - [facility]  04/11/23, 10:47 AM

## 2023-04-11 NOTE — ED Notes (Signed)
 Pt resting in bed with eyes closed at this time. No distress noted. Will continue to monitor.

## 2023-04-12 MED ORDER — NICOTINE 7 MG/24HR TD PT24
7.0000 mg | MEDICATED_PATCH | Freq: Once | TRANSDERMAL | Status: AC
  Administered 2023-04-12: 7 mg via TRANSDERMAL
  Filled 2023-04-12: qty 1

## 2023-04-12 NOTE — TOC Progression Note (Signed)
 Transition of Care [facility]) - Progression Note    Patient Details  Name: Anne [name] MRN: [id number] Date of Birth: 1950-05-22  Transition of Care [facility]) CM/SW Contact  Margarito Liner, LCSW Phone Number: 04/12/2023, 9:44 AM  Clinical Narrative:   CSW left voicemail for placement social worker at [facility] DSS.  Expected Discharge Plan: Memory Care Barriers to Discharge: Continued Medical Work up  Expected Discharge Plan and Services       Living arrangements for the past 2 months: Single Family Home                                       Social Determinants of Health (SDOH) Interventions SDOH Screenings   Food Insecurity: No Food Insecurity (02/20/2023)  Housing: Low Risk  (02/20/2023)  Transportation Needs: No Transportation Needs (02/20/2023)  Utilities: Not At Risk (02/20/2023)  Alcohol Screen: Low Risk  (02/22/2023)  Depression (PHQ2-9): High Risk (06/21/2022)  Financial Resource Strain: Low Risk  (02/22/2023)  Physical Activity: Inactive (02/22/2023)  Social Connections: Moderately Isolated (02/20/2023)  Stress: Patient Unable To Answer (02/22/2023)  Tobacco Use: Medium Risk (04/04/2023)  Health Literacy: Patient Unable To Answer (02/22/2023)    Readmission Risk Interventions     No data to display

## 2023-04-12 NOTE — ED Notes (Signed)
Meal tray provided. Family at bedside.  

## 2023-04-12 NOTE — ED Notes (Signed)
vol/toc placement.. 

## 2023-04-12 NOTE — ED Provider Notes (Signed)
-----------------------------------------   11:36 PM on 04/12/2023 -----------------------------------------   Blood pressure 127/79, pulse 78, temperature 98 F (36.7 C), temperature source Oral, resp. rate 18, height 5\' 5"  (1.651 m), weight 70.3 kg, SpO2 96%.  The patient is calm and cooperative at this time.  There have been no acute events since the last update.  Awaiting disposition plan from [facility] team.   Janith Lima, MD 04/12/23 [phone]

## 2023-04-12 NOTE — ED Notes (Signed)
Pt given phone 

## 2023-04-12 NOTE — ED Notes (Signed)
 Husband at bedside.

## 2023-04-12 NOTE — ED Notes (Signed)
 Pt sitting outside of room on bench. RN introduced self to pt. Pt agitated and stating that she is leaving. Pt further stating I want some decent clothes. RN obtained blue hospital scrubs for pt. Pt back to room to change into clothes.

## 2023-04-12 NOTE — ED Notes (Signed)
 Pt assisted back to room in recliner and provided breakfast tray

## 2023-04-12 NOTE — ED Provider Notes (Signed)
-----------------------------------------   6:26 AM on 04/12/2023 -----------------------------------------   Blood pressure 127/83, pulse 85, temperature 98.7 F (37.1 C), temperature source Oral, resp. rate 18, height 5\' 5"  (1.651 m), weight 70.3 kg, SpO2 95%.  The patient is calm and cooperative at this time.  There have been no acute events since the last update.  Awaiting disposition plan from Social Work team.   Irean Hong, MD 04/12/23 [phone]

## 2023-04-12 NOTE — ED Notes (Signed)
 Pt back of room sitting on bench.

## 2023-04-13 NOTE — ED Notes (Signed)
 Pt in room walking around, continuing to 'wash up.'

## 2023-04-13 NOTE — ED Provider Notes (Signed)
 Emergency Medicine Observation Re-evaluation Note  Physical Exam   BP 127/79   Pulse 78   Temp 98 F (36.7 C) (Oral)   Resp 18   Ht 5\' 5"  (1.651 m)   Wt 70.3 kg   SpO2 96%   BMI 25.79 kg/m   Patient appears in no acute distress.  ED Course / MDM   No reported events during my shift at the time of this note.   Pt is awaiting dispo from consultants   Pilar Jarvis MD    Pilar Jarvis, MD 04/13/23 [phone]

## 2023-04-13 NOTE — ED Notes (Signed)
 Pt currently sleeping will ask if snack is wanted if pt wakes up

## 2023-04-13 NOTE — ED Notes (Signed)
 Report received from off going Nurse. Pt A&O to self this is her baseline. Resting quietly in bed. Rise and fall of chest present. No distress present. Bed low and locked, call light in reach.

## 2023-04-13 NOTE — TOC Progression Note (Addendum)
 Transition of Care [facility]) - Progression Note    Patient Details  Name: Anne [name] MRN: [id number] Date of Birth: [birth year]  [facility]) CM/SW Contact  Liliana Cline, LCSW Phone Number: 04/13/2023, 12:28 PM  Clinical Narrative:    CSW was notified by RN that spouse is asking about taking patient home. CSW called [facility] DSS to follow up, spoke to [place]. Requested call from on call SW.  12:50- Call from [facility] DSS Worker Dalene Carrow. Inquired if there are any barriers to spouse taking patient home. She is checking with her Supervisor.  1:12- Call from [facility] DSS Worker Dalene Carrow - she states there is no protective order in place and APS cannot keep spouse from taking patient home if that is what he chooses. She states Chastiny will continue to work on placement. Updated RN.  Expected Discharge Plan: Memory Care Barriers to Discharge: Continued Medical Work up  Expected Discharge Plan and Services       Living arrangements for the past 2 months: Single Family Home                                       Social Determinants of Health (SDOH) Interventions SDOH Screenings   Food Insecurity: No Food Insecurity (02/20/2023)  Housing: Low Risk  (02/20/2023)  Transportation Needs: No Transportation Needs (02/20/2023)  Utilities: Not At Risk (02/20/2023)  Alcohol Screen: Low Risk  (02/22/2023)  Depression (PHQ2-9): High Risk (06/21/2022)  Financial Resource Strain: Low Risk  (02/22/2023)  Physical Activity: Inactive (02/22/2023)  Social Connections: Moderately Isolated (02/20/2023)  Stress: Patient Unable To Answer (02/22/2023)  Tobacco Use: Medium Risk (04/04/2023)  Health Literacy: Patient Unable To Answer (02/22/2023)    Readmission Risk Interventions     No data to display

## 2023-04-13 NOTE — ED Notes (Signed)
 Pt continuously leaving room to try to leave.  Pt given family phone numbers and phone to de-escalate behavior and provide distraction.

## 2023-04-13 NOTE — ED Notes (Signed)
 Pt given lunch tray and beverage. Husband at the bedside.

## 2023-04-13 NOTE — ED Notes (Signed)
Pt refused snack 

## 2023-04-13 NOTE — ED Notes (Addendum)
 Pt husband asking to speak with social work about why his wife is still here and why he hasn't been able to take her home yet. ED MD aware and social work notified.

## 2023-04-13 NOTE — ED Notes (Signed)
 Pt out of room and walking quickly down the hall, redirected to room by this RN and NT Ladona Ridgel.  Given second pair of dry pants.Pt changed into pants and requested and alarm to be woken up at 5 or 5:30.

## 2023-04-13 NOTE — ED Notes (Signed)
 Pt given dry pants and underwear per her request.

## 2023-04-14 NOTE — ED Notes (Signed)
 Patient offered lunch meal tray at bedside. Patient preferred to sleep turning onto her right side.

## 2023-04-14 NOTE — ED Notes (Signed)
 VOL  PENDING  TOC  PLACEMENT

## 2023-04-14 NOTE — ED Notes (Signed)
 Husband here to visit with pt.

## 2023-04-14 NOTE — ED Notes (Signed)
 No 0200 rounding charted d/t daylight savings time.

## 2023-04-14 NOTE — ED Notes (Addendum)
 RN offered warm blanket or beverage/snack to patient. Patient refused.

## 2023-04-14 NOTE — ED Notes (Signed)
 Provided patient with dinner tray.

## 2023-04-14 NOTE — ED Notes (Signed)
Pt husband left.

## 2023-04-14 NOTE — ED Notes (Signed)
 Patient transferred to room 22. Care handoff given to [facility] [place]. Patient requested to call husband and family. Provided patient with a phone. Family member at bedside.

## 2023-04-14 NOTE — ED Notes (Signed)
Breakfast tray brought to patient

## 2023-04-14 NOTE — ED Notes (Signed)
 Patient walked out of room wondering in hall before asking "where is the bathroom?" RN redirected patient to bathroom at end of hallway.

## 2023-04-14 NOTE — ED Provider Notes (Signed)
-----------------------------------------   5:31 AM on 04/14/2023 -----------------------------------------   Blood pressure (!) 110/58, pulse 70, temperature 98 F (36.7 C), temperature source Oral, resp. rate 18, height 5\' 5"  (1.651 m), weight 70.3 kg, SpO2 95%.  The patient is calm and cooperative at this time.  There have been no acute events since the last update.  Awaiting disposition plan from case management/social work.    Daveena Elmore, Layla Maw, DO 04/14/23 [phone]

## 2023-04-14 NOTE — ED Notes (Signed)
 Patient awake walked to bed side toilet. RN offered Lunch tray/meal, RN set it up at bedside. Pateint is now sitting on the side of bed.

## 2023-04-15 NOTE — ED Notes (Signed)
 RN attempted to contact, NO ANSWER  Anne [name] Spouse Emergency Contact [phone]

## 2023-04-15 NOTE — Progress Notes (Signed)
 Mobility Specialist - Progress Note     04/15/23 1121  Mobility  Activity Ambulated independently in hallway  Level of Assistance Independent after set-up  Assistive Device Front wheel walker  Distance Ambulated (ft) 480 ft  Range of Motion/Exercises Active  Activity Response Tolerated well  Mobility Referral Yes  Mobility visit 1 Mobility  Mobility Specialist Start Time (ACUTE ONLY) 1107  Mobility Specialist Stop Time (ACUTE ONLY) 1122  Mobility Specialist Time Calculation (min) (ACUTE ONLY) 15 min   Pt resting in bed on RA upon entry. Pt STS MinA and ambulates to hallway around NS Indep after set-up (author slowed gate to minimize furniture surfing). Pt pleasant and agreeable. Pt maintained steady gait throughout session. Pt returned to bed and left with needs in reach.   Johnathan Hausen Mobility Specialist 04/15/23, 11:25 AM

## 2023-04-15 NOTE — ED Notes (Signed)
 Breakfast provided.

## 2023-04-15 NOTE — TOC Progression Note (Addendum)
 Transition of Care [facility]) - Progression Note    Patient Details  Name: Anne [name] MRN: [id number] Date of Birth: 1951-01-02  [facility]) CM/SW Contact  Margarito Liner, LCSW Phone Number: 04/15/2023, 10:47 AM  Clinical Narrative:  [facility] and [facility] are considering referral. CSW asked liaison to find out what extra information they need. CSW spoke to [facility] DSS placement social worker. She sent patient's referral to [facility] but has not received a response. She will reach out to them for an update. CSW left voicemail for husband.   11:56 am: Received call back from husband. Provided update. A SNF in [name], [name] might have a memory care bed but it will cost $308.92 per day/$9,267 per month. Husband confirmed he cannot private pay for this. Husband confirmed that they are legally married and have been so for 20 years. CSW left voicemail for DSS placement social worker to notify.  12:38 pm: Received call back from DSS placement social worker. Provided update.  2:06 pm: Husband wants to take patient home due to concerns about only being able to visit patient for 15 minutes. RN is aware. Sent secure chat to MD. Patient was active with [facility] prior to admission. CSW asked liaison to see which services were ordered.   Expected Discharge Plan: Memory Care Barriers to Discharge: Continued Medical Work up  Expected Discharge Plan and Services       Living arrangements for the past 2 months: Single Family Home                                       Social Determinants of Health (SDOH) Interventions SDOH Screenings   Food Insecurity: No Food Insecurity (02/20/2023)  Housing: Low Risk  (02/20/2023)  Transportation Needs: No Transportation Needs (02/20/2023)  Utilities: Not At Risk (02/20/2023)  Alcohol Screen: Low Risk  (02/22/2023)  Depression (PHQ2-9): High Risk (06/21/2022)  Financial Resource Strain: Low Risk  (02/22/2023)  Physical  Activity: Inactive (02/22/2023)  Social Connections: Moderately Isolated (02/20/2023)  Stress: Patient Unable To Answer (02/22/2023)  Tobacco Use: Medium Risk (04/04/2023)  Health Literacy: Patient Unable To Answer (02/22/2023)    Readmission Risk Interventions     No data to display

## 2023-04-15 NOTE — ED Notes (Signed)
 Pt has continued to require constant redirection for the last 1.5 hours. Pt has grown increasingly agitated. Will give PRN med.

## 2023-04-15 NOTE — ED Notes (Signed)
 Hospital meal provided.  100% consumed, pt tolerated w/o complaints.  Waste discarded appropriately.

## 2023-04-15 NOTE — ED Notes (Addendum)
 Pt had supervised 15 minute visit. CSW at bedside during visit as well.  Pt expresses wishes to go home, husband states he wants to take the patient home. Will notify EDP.

## 2023-04-15 NOTE — ED Notes (Signed)
 Social work spoke with husband and husband stated he is going to put ammunition in the garage and per social work he is on his way to come and get patient for discharge home.

## 2023-04-15 NOTE — ED Provider Notes (Addendum)
 Emergency Medicine Observation Re-evaluation Note  Anne [name] is a 73 y.o. female, seen on rounds today.  Pt initially presented to the ED for complaints of TOC Placement  Currently, the patient is resting comfortably.  Physical Exam  BP 114/71   Pulse 76   Temp 98.1 F (36.7 C) (Oral)   Resp 16   Ht 5\' 5"  (1.651 m)   Wt 70.3 kg   SpO2 90%   BMI 25.79 kg/m  General: No acute distress Cardiac: Well-perfused extremities Lungs: No respiratory distress Psych: Appropriate mood and affect  ED Course / MDM  EKG:   I have reviewed the labs performed to date as well as medications administered while in observation.  Recent changes in the last 24 hours include none.  Plan  Current plan is for DC home with patient's husband.  Social work has arranged for home health order prior to discharge   Merwyn Katos, MD 04/15/23 2956    Merwyn Katos, MD 04/15/23 [phone]

## 2023-04-15 NOTE — ED Notes (Signed)
 VOL/TOC Placement

## 2023-04-15 NOTE — ED Notes (Addendum)
 RN attempted to contact, NO ANSWER  Anne [name] Spouse Emergency Contact [phone]  To inform him pt ready to go home and that he can return to pick up patient as he and patient expressed at time of visit.

## 2023-04-15 NOTE — TOC Transition Note (Addendum)
 Transition of Care [facility]) - Discharge Note   Patient Details  Name: Anne [name] MRN: [id number] Date of Birth: [birth year]  Transition of Care [facility]) CM/SW Contact:  Margarito Liner, LCSW Phone Number: 04/15/2023, 2:40 PM   Clinical Narrative:  Patient's husband is at bedside and wants to take patient home. Husband confirmed he is with her 24/7. [facility] DSS is aware and will continue to follow. Social worker stated they will likely get protective services involved. Patient does not have a documented HCPOA so husband is her Horticulturist, commercial. Discussed case with [facility] supervisor who confirmed that if husband wants to take her home, he is allowed to do so. Husband confirmed he has a firearm in the home but patient does not know where it is and it is still in the box, not loaded. Husband confirmed that there is also ammunition in the home but she does not know where that is either. Discussed concerns with patient finding the firearm and/or the ammunition. Asked he there was an outside building he could put the ammunition in. Husband said there is not but he will put it in the garage. CSW sent secure chat to MD, RN, and [facility] supervisor to notify. Patient was active with [facility] prior to admission for PT. They can add OT. CSW asked MD for home health orders. No RN, aide, or social worker available. No further concerns. CSW signing off.  Final next level of care: Home w Home Health Services Barriers to Discharge: Barriers Resolved   Patient Goals and CMS Choice   CMS Medicare.gov Compare Post Acute Care list provided to:: Patient Represenative (must comment) Choice offered to / list presented to : Adult Children      Discharge Placement                Patient to be transferred to facility by: Husband Name of family member notified: [name] Clausing Patient and family notified of of transfer: 04/15/23  Discharge Plan and Services Additional resources added to the After  Visit Summary for                            [facility] Arranged: PT, OT [facility] Agency: [name] Date [facility] Agency Contacted: 04/15/23   Representative spoke with at [facility] Agency: Velna Hatchet  Social Drivers of Health (SDOH) Interventions SDOH Screenings   Food Insecurity: No Food Insecurity (02/20/2023)  Housing: Low Risk  (02/20/2023)  Transportation Needs: No Transportation Needs (02/20/2023)  Utilities: Not At Risk (02/20/2023)  Alcohol Screen: Low Risk  (02/22/2023)  Depression (PHQ2-9): High Risk (06/21/2022)  Financial Resource Strain: Low Risk  (02/22/2023)  Physical Activity: Inactive (02/22/2023)  Social Connections: Moderately Isolated (02/20/2023)  Stress: Patient Unable To Answer (02/22/2023)  Tobacco Use: Medium Risk (04/04/2023)  Health Literacy: Patient Unable To Answer (02/22/2023)     Readmission Risk Interventions     No data to display

## 2023-04-16 ENCOUNTER — Other Ambulatory Visit: Payer: PPO

## 2023-04-16 ENCOUNTER — Telehealth: Payer: Self-pay

## 2023-04-16 ENCOUNTER — Ambulatory Visit: Payer: PPO | Admitting: Oncology

## 2023-04-16 NOTE — Transitions of Care (Post Inpatient/ED Visit) (Signed)
   04/16/2023  Name: Anne [name] MRN: [id number] DOB: 09/20/1950  Today's TOC FU Call Status: Today's TOC FU Call Status:: Unsuccessful Call (1st Attempt) Unsuccessful Call (1st Attempt) Date: 04/16/23  Attempted to reach the patient regarding the most recent Inpatient/ED visit.  Follow Up Plan: Additional outreach attempts will be made to reach the patient to complete the Transitions of Care (Post Inpatient/ED visit) call.   Signature Karena Addison, LPN [facility] Nurse Health Advisor Direct Dial [phone]

## 2023-04-17 ENCOUNTER — Telehealth: Payer: Self-pay | Admitting: *Deleted

## 2023-04-17 NOTE — Progress Notes (Unsigned)
 Complex Care Management Note Care Guide Note  04/17/2023 Name: Anne [name] MRN: [id number] DOB: [birth year]   Complex Care Management Outreach Attempts: An unsuccessful telephone outreach was attempted today to offer the patient information about available complex care management services.  Follow Up Plan:  Additional outreach attempts will be made to offer the patient complex care management information and services.   Encounter Outcome:  No Answer  Burman Nieves, CMA, Care Guide [facility]  [facility], [facility] Guide Direct Dial: [phone]  Fax: [phone] Website: [email or web address]

## 2023-04-18 NOTE — Progress Notes (Signed)
 Complex Care Management Note  Care Guide Note 04/18/2023 Name: Anne [name] MRN: [id number] DOB: [birth year]  Anne [name] is a 73 y.o. year old female who sees Birdie Sons, Yehuda Mao, MD (Inactive) for primary care. I reached out to Karol Skarzynski Deans by phone today to offer complex care management services.  [name] was given information about Complex Care Management services today including:   The Complex Care Management services include support from the care team which includes your Nurse Care Manager, Clinical Social Worker, or Pharmacist.  The Complex Care Management team is here to help remove barriers to the health concerns and goals most important to you. Complex Care Management services are voluntary, and the patient may decline or stop services at any time by request to their care team member.   Complex Care Management Consent Status: Patient agreed to services and verbal consent obtained.   Follow up plan:  Telephone appointment with complex care management team member scheduled for:  04/24/2023  Encounter Outcome:  Patient Scheduled  Burman Nieves, CMA, Care Guide [facility]  [facility], [facility] Guide Direct Dial: [phone]  Fax: [phone] Website: [email or web address]

## 2023-04-18 NOTE — Transitions of Care (Post Inpatient/ED Visit) (Signed)
   04/18/2023  Name: Anne [name] MRN: [id number] DOB: Apr 15, 1950  Today's TOC FU Call Status: Today's TOC FU Call Status:: Unsuccessful Call (2nd Attempt) Unsuccessful Call (1st Attempt) Date: 04/16/23 Unsuccessful Call (2nd Attempt) Date: 04/18/23  Attempted to reach the patient regarding the most recent Inpatient/ED visit.  Follow Up Plan: Additional outreach attempts will be made to reach the patient to complete the Transitions of Care (Post Inpatient/ED visit) call.   Signature Karena Addison, LPN [facility] Nurse Health Advisor Direct Dial [phone]

## 2023-04-22 ENCOUNTER — Ambulatory Visit: Payer: PPO | Admitting: Oncology

## 2023-04-22 ENCOUNTER — Inpatient Hospital Stay (HOSPITAL_BASED_OUTPATIENT_CLINIC_OR_DEPARTMENT_OTHER): Admitting: Oncology

## 2023-04-22 ENCOUNTER — Other Ambulatory Visit: Payer: PPO

## 2023-04-22 ENCOUNTER — Encounter: Payer: Self-pay | Admitting: Oncology

## 2023-04-22 ENCOUNTER — Inpatient Hospital Stay: Attending: Oncology

## 2023-04-22 VITALS — BP 130/75 | HR 71 | Temp 96.8°F | Resp 19 | Ht 67.0 in | Wt 146.0 lb

## 2023-04-22 DIAGNOSIS — R5383 Other fatigue: Secondary | ICD-10-CM | POA: Insufficient documentation

## 2023-04-22 DIAGNOSIS — Z79890 Hormone replacement therapy (postmenopausal): Secondary | ICD-10-CM | POA: Diagnosis not present

## 2023-04-22 DIAGNOSIS — F0390 Unspecified dementia without behavioral disturbance: Secondary | ICD-10-CM | POA: Diagnosis not present

## 2023-04-22 DIAGNOSIS — Z87891 Personal history of nicotine dependence: Secondary | ICD-10-CM | POA: Insufficient documentation

## 2023-04-22 DIAGNOSIS — E063 Autoimmune thyroiditis: Secondary | ICD-10-CM | POA: Diagnosis not present

## 2023-04-22 DIAGNOSIS — Z923 Personal history of irradiation: Secondary | ICD-10-CM | POA: Insufficient documentation

## 2023-04-22 DIAGNOSIS — Z8041 Family history of malignant neoplasm of ovary: Secondary | ICD-10-CM | POA: Diagnosis not present

## 2023-04-22 DIAGNOSIS — Z85118 Personal history of other malignant neoplasm of bronchus and lung: Secondary | ICD-10-CM | POA: Diagnosis present

## 2023-04-22 DIAGNOSIS — Z08 Encounter for follow-up examination after completed treatment for malignant neoplasm: Secondary | ICD-10-CM | POA: Insufficient documentation

## 2023-04-22 DIAGNOSIS — Z79899 Other long term (current) drug therapy: Secondary | ICD-10-CM | POA: Insufficient documentation

## 2023-04-22 DIAGNOSIS — Z9221 Personal history of antineoplastic chemotherapy: Secondary | ICD-10-CM | POA: Insufficient documentation

## 2023-04-22 DIAGNOSIS — C3411 Malignant neoplasm of upper lobe, right bronchus or lung: Secondary | ICD-10-CM

## 2023-04-22 DIAGNOSIS — Z95828 Presence of other vascular implants and grafts: Secondary | ICD-10-CM

## 2023-04-22 LAB — CBC (CANCER CENTER ONLY)
HCT: 40.4 % (ref 36.0–46.0)
Hemoglobin: 12.4 g/dL (ref 12.0–15.0)
MCH: 28.8 pg (ref 26.0–34.0)
MCHC: 30.7 g/dL (ref 30.0–36.0)
MCV: 94 fL (ref 80.0–100.0)
Platelet Count: 143 10*3/uL — ABNORMAL LOW (ref 150–400)
RBC: 4.3 MIL/uL (ref 3.87–5.11)
RDW: 14.7 % (ref 11.5–15.5)
WBC Count: 4.4 10*3/uL (ref 4.0–10.5)
nRBC: 0 % (ref 0.0–0.2)

## 2023-04-22 LAB — CMP (CANCER CENTER ONLY)
ALT: 19 U/L (ref 0–44)
AST: 26 U/L (ref 15–41)
Albumin: 3.6 g/dL (ref 3.5–5.0)
Alkaline Phosphatase: 76 U/L (ref 38–126)
Anion gap: 8 (ref 5–15)
BUN: 18 mg/dL (ref 8–23)
CO2: 27 mmol/L (ref 22–32)
Calcium: 8.8 mg/dL — ABNORMAL LOW (ref 8.9–10.3)
Chloride: 105 mmol/L (ref 98–111)
Creatinine: 0.92 mg/dL (ref 0.44–1.00)
GFR, Estimated: >60 mL/min (ref >60)
Glucose, Bld: 93 mg/dL (ref 70–99)
Potassium: 3.7 mmol/L (ref 3.5–5.1)
Sodium: 140 mmol/L (ref 135–145)
Total Bilirubin: 0.3 mg/dL (ref 0.0–1.2)
Total Protein: 6.8 g/dL (ref 6.5–8.1)

## 2023-04-22 MED ORDER — HEPARIN SOD (PORK) LOCK FLUSH 100 UNIT/ML IV SOLN
500.0000 [IU] | Freq: Once | INTRAVENOUS | Status: AC
  Administered 2023-04-22: 500 [IU] via INTRAVENOUS
  Filled 2023-04-22: qty 5

## 2023-04-22 NOTE — Progress Notes (Signed)
 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  She has been referred to [place] for findings of bone metastases and her recent MRI.  She has a prior history of 3 packs/day day smoking for over 45 years and quit smoking 5 years ago.She had a CT chest abdomen pelvis in 2018 which showed a 5 mm lung nodule in the left lower lobe.  Recently over the last 2 months patient has been having worsening back pain and was referred to orthopedics.  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  Pathologic fracture of S1.   PET scan showed 2 RUL lung nodules, hilar and mediastinal adenopathy and widespread bone mets. MRI brain negative.   Patient completed palliative RT to her spine. Bronchoscopy showed small cell lung cancer.  Palliative carboplatin, etoposide and Tecentriq started on 08/18/2018. Scans after 4 cycles showed stable disease. 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  She reports being compliant but her values fluctuate widely.    Interval history-patient was a admitted to the hospital in in February 2025 for altered mental status.  CT head at that time showed subdural hematoma which had decreased in size as compared to her prior CT in January 2025.  Mass effect and midline shift had also decreased.  She lives with her significant other and continues to struggle with dementia.  She is here with her husband today.  He reports giving her thyroid medication daily.  ECOG PS- 2 Pain scale- 0   Review of systems- Review of Systems  Constitutional:  Positive for malaise/fatigue. Negative for chills, fever and weight loss.  HENT:  Negative for congestion, ear discharge and nosebleeds.   Eyes:  Negative for blurred vision.  Respiratory:  Negative for cough, hemoptysis, sputum production, shortness of breath and wheezing.   Cardiovascular:  Negative for chest pain, palpitations, orthopnea and claudication.  Gastrointestinal:  Negative for abdominal pain, blood in stool, constipation, diarrhea, heartburn, melena, nausea and vomiting.  Genitourinary:  Negative for dysuria, flank pain, frequency, hematuria and urgency.  Musculoskeletal:  Negative for back pain, joint pain and myalgias.  Skin:  Negative for rash.  Neurological:  Negative for dizziness, tingling, focal weakness, seizures, weakness and headaches.  Endo/Heme/Allergies:  Does not bruise/bleed easily.  Psychiatric/Behavioral:  Positive for memory loss. Negative for depression and suicidal ideas. The patient does not have insomnia.       Allergies  Allergen Reactions   Bee Venom Swelling     Past Medical History:  Diagnosis Date   Arthritis    Cirrhosis (HCC)    Dementia (  HCC)    Depression    GERD (gastroesophageal reflux disease)    Hyperlipidemia    Hypertension    Lung cancer (HCC)    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), in remission, 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    Metastatic bone cancer    Seizures (HCC)    Subdural hematoma (HCC)      Past Surgical History:  Procedure Laterality Date   APPENDECTOMY  1971   CHOLECYSTECTOMY  1971   COLONOSCOPY WITH PROPOFOL N/A 11/15/2016   Procedure: COLONOSCOPY WITH PROPOFOL;  Surgeon: Wyline Mood, MD;  Location: [facility] ENDOSCOPY;  Service: Gastroenterology;  Laterality: N/A;   ENDOBRONCHIAL ULTRASOUND Right 07/30/2018   Procedure: ENDOBRONCHIAL ULTRASOUND;  Surgeon: Shane Crutch, MD;  Location: [facility] ORS;  Service: Pulmonary;  Laterality: Right;   ESOPHAGOGASTRODUODENOSCOPY (EGD) WITH PROPOFOL N/A 01/07/2017   Procedure: ESOPHAGOGASTRODUODENOSCOPY (EGD) WITH PROPOFOL;  Surgeon: Wyline Mood, MD;  Location: [facility] ENDOSCOPY;  Service: Gastroenterology;  Laterality: N/A;   LAPAROSCOPY N/A 03/01/2017   Procedure: LAPAROSCOPY DIAGNOSTIC;  Surgeon: Earline Mayotte, MD;  Location: [facility] ORS;  Service: General;  Laterality: N/A;   PORTA CATH INSERTION N/A 08/14/2018   Procedure: PORTA CATH INSERTION;  Surgeon: Annice Needy, MD;  Location: [facility] INVASIVE CV LAB;  Service: Cardiovascular;  Laterality: N/A;   TONSILECTOMY, ADENOIDECTOMY, BILATERAL MYRINGOTOMY AND TUBES  1955   TONSILLECTOMY     VENTRAL HERNIA REPAIR N/A 03/01/2017   10 x 14 CM Ventralight ST mesh, intraperitoneal location.    VENTRAL HERNIA REPAIR N/A 03/01/2017   Procedure: HERNIA REPAIR VENTRAL ADULT;  Surgeon: Earline Mayotte, MD;  Location: [facility] ORS;  Service: General;  Laterality: N/A;    Social History   Socioeconomic History   Marital status: Divorced    Spouse name: Not on file   Number of children: 2   Years of education: Not on file   Highest education level: High school graduate  Occupational History   Not on file  Tobacco Use   Smoking status: Former    Current packs/day: 0.00    Average packs/day: 2.0 packs/day for 50.0 years (100.0 ttl pk-yrs)    Types: Cigarettes, E-cigarettes    Start date: 02/06/1962    Quit date: 02/07/2012     Years since quitting: 11.2   Smokeless tobacco: Never  Vaping Use   Vaping status: Former   Start date: 10/06/2013   Devices: uses no liquid  Substance and Sexual Activity   Alcohol use: No    Alcohol/week: 0.0 standard drinks of alcohol   Drug use: No   Sexual activity: Not Currently  Other Topics Concern   Not on file  Social History Narrative   Married   Retired   Engineer, manufacturing systems level of education    No children    1 cup of coffee   Social Drivers of Corporate investment banker Strain: Low Risk  (02/22/2023)   Overall Financial Resource Strain (CARDIA)    Difficulty of Paying Living Expenses: Not very hard  Food Insecurity: No Food Insecurity (02/20/2023)   Hunger Vital Sign    Worried About Running Out of Food in the Last Year: Never true    Ran Out of Food in the Last Year: Never true  Transportation Needs: No Transportation Needs (02/20/2023)   PRAPARE - Administrator, Civil Service (Medical): No    Lack of Transportation (Non-Medical): No  Physical Activity: Inactive (02/22/2023)   Exercise Vital Sign    Days of Exercise per Week:  0 days    Minutes of Exercise per Session: 0 min  Stress: Patient Unable To Answer (02/22/2023)   [place] [employer] - Occupational Stress Questionnaire    Feeling of Stress : Patient unable to answer  Social Connections: Moderately Isolated (02/20/2023)   Social Connection and Isolation Panel [NHANES]    Frequency of Communication with Friends and Family: Once a week    Frequency of Social Gatherings with Friends and Family: More than three times a week    Attends Religious Services: Never    Database administrator or Organizations: No    Attends Banker Meetings: Never    Marital Status: Married  Catering manager Violence: Not At Risk (02/20/2023)   Humiliation, Afraid, Rape, and Kick questionnaire    Fear of Current or Ex-Partner: No    Emotionally Abused: No    Physically Abused: No     Sexually Abused: No    Family History  Problem Relation Age of Onset   Hypertension Mother    Ovarian cancer Mother 70   Dementia Mother    Heart disease Father    Stroke Father    Seizures Father    Ovarian cancer Sister        ? dx cancer had hyst.   Breast cancer Neg Hx      Current Outpatient Medications:    acetaminophen (TYLENOL) 500 MG tablet, Take 1,000 mg by mouth every 6 (six) hours as needed for moderate pain (pain score 4-6)., Disp: , Rfl:    [Paused] amLODipine (NORVASC) 5 MG tablet, Take 1 tablet by mouth once daily (Patient not taking: Reported on 03/15/2023), Disp: 90 tablet, Rfl: 0   cefdinir (OMNICEF) 300 MG capsule, Take 1 capsule (300 mg total) by mouth 2 (two) times daily. (Patient not taking: Reported on 04/04/2023), Disp: 14 capsule, Rfl: 0   levETIRAcetam (KEPPRA) 1000 MG tablet, Take 1 tablet (1,000 mg total) by mouth 2 (two) times daily., Disp: 180 tablet, Rfl: 3   levothyroxine (SYNTHROID) 112 MCG tablet, Take 1 tablet (112 mcg total) by mouth daily at 6 (six) AM., Disp: 30 tablet, Rfl: 0   lidocaine (LIDODERM) 5 %, Place 1 patch onto the skin every 12 (twelve) hours. Remove & Discard patch within 12 hours or as directed by MD, Disp: 10 patch, Rfl: 0   memantine (NAMENDA) 10 MG tablet, Take 1 tablet (10 mg total) by mouth 2 (two) times daily., Disp: 60 tablet, Rfl: 11   Midazolam (NAYZILAM) 5 MG/0.1ML SOLN, Place 5 mg into the nose as needed., Disp: 2 each, Rfl: 2   Multiple Vitamin (MULTIVITAMIN) tablet, Take 1 tablet by mouth daily., Disp: , Rfl:    QUEtiapine (SEROQUEL) 50 MG tablet, Take 1 tablet (50 mg total) by mouth at bedtime., Disp: 90 tablet, Rfl: 0   rivastigmine (EXELON) 3 MG capsule, Take 1 capsule (3 mg total) by mouth 2 (two) times daily., Disp: 60 capsule, Rfl: 11   rosuvastatin (CRESTOR) 20 MG tablet, Take 1 tablet (20 mg total) by mouth daily., Disp: 90 tablet, Rfl: 3   sertraline (ZOLOFT) 100 MG tablet, TAKE 1 & 1/2 (ONE & ONE-HALF) TABLETS  BY MOUTH ONCE DAILY, Disp: 135 tablet, Rfl: 0 No current facility-administered medications for this visit.  Facility-Administered Medications Ordered in Other Visits:    denosumab (XGEVA) injection 120 mg, 120 mg, Subcutaneous, Q30 days, Creig Hines, MD, 120 mg at 03/05/19 1525   heparin lock flush 100 UNIT/ML injection, , , ,  Physical exam:  Vitals:   04/22/23 0951  BP: 130/75  Pulse: 71  Resp: 19  Temp: (!) 96.8 F (36 C)  TempSrc: Tympanic  SpO2: 98%  Weight: 146 lb (66.2 kg)  Height: 5\' 7"  (1.702 m)   Physical Exam Cardiovascular:     Rate and Rhythm: Normal rate and regular rhythm.     Heart sounds: Normal heart sounds.  Pulmonary:     Effort: Pulmonary effort is normal.     Breath sounds: Normal breath sounds.  Abdominal:     General: Bowel sounds are normal.     Palpations: Abdomen is soft.  Skin:    General: Skin is warm and dry.  Neurological:     Mental Status: She is alert.     Comments: Oriented to self         Latest Ref Rng & Units 04/22/2023    9:34 AM  CMP  Glucose 70 - 99 mg/dL 93   BUN 8 - 23 mg/dL 18   Creatinine 0.10 - 1.00 mg/dL 2.72   Sodium 536 - 644 mmol/L 140   Potassium 3.5 - 5.1 mmol/L 3.7   Chloride 98 - 111 mmol/L 105   CO2 22 - 32 mmol/L 27   Calcium 8.9 - 10.3 mg/dL 8.8   Total Protein 6.5 - 8.1 g/dL 6.8   Total Bilirubin 0.0 - 1.2 mg/dL 0.3   Alkaline Phos 38 - 126 U/L 76   AST 15 - 41 U/L 26   ALT 0 - 44 U/L 19       Latest Ref Rng & Units 04/22/2023    9:34 AM  CBC  WBC 4.0 - 10.5 K/uL 4.4   Hemoglobin 12.0 - 15.0 g/dL 03.4   Hematocrit 74.2 - 46.0 % 40.4   Platelets 150 - 400 K/uL 143     No images are attached to the encounter.  CT Angio Chest Pulmonary Embolism (PE) W or WO Contrast Result Date: 03/25/2023 CLINICAL DATA:  Positive D-dimer, shortness of breath and hypoxia. History of right upper lobe small cell lung cancer, metastatic. EXAM: CT ANGIOGRAPHY CHEST WITH CONTRAST TECHNIQUE: Multidetector CT  imaging of the chest was performed using the standard protocol during bolus administration of intravenous contrast. Multiplanar CT image reconstructions and MIPs were obtained to evaluate the vascular anatomy. RADIATION DOSE REDUCTION: This exam was performed according to the departmental dose-optimization program which includes automated exposure control, adjustment of the mA and/or kV according to patient size and/or use of iterative reconstruction technique. CONTRAST:  75mL OMNIPAQUE IOHEXOL 350 MG/ML SOLN COMPARISON:  CT scans of the chest, abdomen and pelvis with IV contrast 10/10/2022 and 05/30/2022. Also, portable chest from today, portable chest 03/23/2023. FINDINGS: Cardiovascular: Right chest port with IJ approach catheter tip in the distal SVC. There are calcifications scattered across the aortic valve leaflets. Moderate to heavy aortic atherosclerosis. Additional atherosclerosis in the great vessels with two-vessel aortic arch with normal variant brachiobicarotid trunk. There is no aortic aneurysm, dissection or stenosis. The cardiac size is normal, with three-vessel coronary artery calcifications and lipomatous hypertrophy of the interatrial septum Pulmonary arteries and veins are within normal caliber limits. No arterial embolism is seen. No pericardial effusion. Mediastinum/Nodes: No enlarged mediastinal, hilar, or axillary lymph nodes. Thyroid gland, trachea, and esophagus demonstrate no significant findings. Lungs/Pleura: The lungs are emphysematous with mild-to-moderate centrilobular changes predominating. There is diffuse bronchial thickening. There are 2 right upper lobe apical nodules each measuring 4 mm on series 5 axial images 25  and 33. These are unchanged. There is increased posterior atelectasis in both lower lobes. No new or further nodules are identified. No active infiltrate is seen. There are trace pleural effusions. No pulmonary edema or infiltrate. Upper Abdomen: The liver is  cirrhotic and mildly steatotic. Gallbladder absent as before. Abdominal aortic atherosclerosis. No acute upper abdominal findings. Musculoskeletal: Sclerosis and partial vertebral body height loss at T2 is unchanged. There are degenerative changes and mild dextroscoliosis of the thoracic spine. No acute or other significant osseous findings. No mass is seen of the visualized chest wall. Review of the MIP images confirms the above findings. IMPRESSION: 1. No evidence of arterial dilatation or embolus. 2. Aortic and coronary artery atherosclerosis, and lipomatous hypertrophy of the inter-atrial septum. 3. Emphysema with bronchitis. 4. Two stable 4 mm right upper lobe apical nodules. 5. Trace pleural effusions with increased posterior atelectasis in both lower lobes. 6. Hepatic cirrhosis and mild steatosis. 7. Stable sclerosis and partial vertebral body height loss at T2. Aortic Atherosclerosis (ICD10-I70.0) and Emphysema (ICD10-J43.9). Electronically Signed   By: Almira Bar M.D.   On: 03/25/2023 22:35   DG Chest Port 1 View Result Date: 03/25/2023 CLINICAL DATA:  Shortness of breath EXAM: PORTABLE CHEST 1 VIEW COMPARISON:  03/23/2023 FINDINGS: Right-sided central venous port tip at the SVC. No acute airspace disease or effusion. Normal cardiac size with aortic atherosclerosis. No pneumothorax IMPRESSION: No active disease. Electronically Signed   By: Jasmine Pang M.D.   On: 03/25/2023 21:11   CT Head Wo Contrast Result Date: 03/24/2023 CLINICAL DATA:  Provided history: Mental status change, unknown cause. EXAM: CT HEAD WITHOUT CONTRAST TECHNIQUE: Contiguous axial images were obtained from the base of the skull through the vertex without intravenous contrast. RADIATION DOSE REDUCTION: This exam was performed according to the departmental dose-optimization program which includes automated exposure control, adjustment of the mA and/or kV according to patient size and/or use of iterative reconstruction  technique. COMPARISON:  Head CT 03/04/2023. FINDINGS: Brain: Cerebral and cerebellar atrophy. A complex mixed density lobulated and septated subdural hematoma overlying the left cerebral hemisphere has slightly decreased in size as compared to the prior head CT of 03/04/2023, now measuring up to 2.1 cm in thickness. Mass effect upon the underlying left cerebral hemisphere has decreased. Midline shift has decreased, now measuring 4 mm (previously 5-6 mm). No demarcated cortical infarct. Vascular: No hyperdense vessel.  Atherosclerotic calcifications. Skull: No calvarial fracture or aggressive osseous lesion. Sinuses/Orbits: No orbital mass or acute orbital finding at the imaged levels. No significant inflammatory paranasal sinus disease. IMPRESSION: 1. A complex mixed density lobulated and septated subdural hematoma overlying the left cerebral hemisphere has slightly decreased in size since the prior head CT of 03/04/2023, now measuring up to 2.1 cm in thickness. Mass effect upon the underlying left cerebral hemisphere has slightly decreased. Midline shift has decreased, now measuring 4 mm (previously 5-6 mm). 2. No evidence of interval acute intracranial abnormality. 3. Parenchymal atrophy. Electronically Signed   By: Jackey Loge D.O.   On: 03/24/2023 13:50   DG Chest Port 1 View Result Date: 03/23/2023 CLINICAL DATA:  [id number] AMS (altered mental status) [id number] EXAM: PORTABLE CHEST 1 VIEW COMPARISON:  April 06, 2022 FINDINGS: The cardiomediastinal silhouette is unchanged in contour.RIGHT chest port with tip terminating over the superior cavoatrial junction. Atherosclerotic calcifications. No pleural effusion. No pneumothorax. No acute pleuroparenchymal abnormality. IMPRESSION: No acute cardiopulmonary abnormality. Electronically Signed   By: Meda Klinefelter M.D.   On: 03/23/2023 14:29  Assessment and plan- Patient is a 73 y.o. female with history of extensive stage small cell lung cancer with bone  metastases currently in remission here for routine follow-up  Patient has not received any maintenance TecentriqSince April 2024.  She had a CT chest in February 2025 which did not show any evidence of recurrent or progressive disease.  Her dementia has been mainly affecting her quality of life.  She follows up with neurology for this.  I will see herIn 4 months with repeat CT chest abdomen pelvis with contrast and bone scan   Visit Diagnosis 1. Encounter for follow-up surveillance of lung cancer   2. Small cell lung cancer, right upper lobe (HCC)      Dr. Owens Shark, MD, MPH [facility] [phone] 04/22/2023 12:53 PM

## 2023-04-23 NOTE — Transitions of Care (Post Inpatient/ED Visit) (Signed)
   04/23/2023  Name: JAEDAN SCHUMAN MRN: [id number] DOB: 12-26-1950  Today's TOC FU Call Status: Today's TOC FU Call Status:: Unsuccessful Call (3rd Attempt) Unsuccessful Call (1st Attempt) Date: 04/16/23 Unsuccessful Call (2nd Attempt) Date: 04/18/23 Unsuccessful Call (3rd Attempt) Date: 04/23/23  Attempted to reach the patient regarding the most recent Inpatient/ED visit.  Follow Up Plan: No further outreach attempts will be made at this time. We have been unable to contact the patient.  Signature Karena Addison, LPN [facility] Nurse Health Advisor Direct Dial [phone]

## 2023-04-24 ENCOUNTER — Ambulatory Visit: Payer: Self-pay | Admitting: *Deleted

## 2023-04-24 NOTE — Patient Instructions (Signed)
 Visit Information  Thank you for taking time to visit with me today. Please don't hesitate to contact me if I can be of assistance to you.   Following are the goals we discussed today:   Goals Addressed             This Visit's Progress    Community resource needs       Activities and task to complete in order to accomplish goals.   Call or go to Department of Social Services to follow up on status of Medicaid application for long term care Call your insurance provider for more information about your Enhanced Benefits ( ie [employer] [place] Pals program) CSW to follow up on resumption of [facility] services with [name] Keep all upcoming appointment discussed today Continue with compliance of taking medication prescribed by Doctor Contact your provider with any new questions or concerns Referral for Palliative care to be considered          Our next appointment is by telephone on 05/08/23 at 1pm  Please call the care guide team at [phone] if you need to cancel or reschedule your appointment.   If you are experiencing a Mental Health or Behavioral Health Crisis or need someone to talk to, please call 911   Patient verbalizes understanding of instructions and care plan provided today and agrees to view in MyChart. Active MyChart status and patient understanding of how to access instructions and care plan via MyChart confirmed with patient.     Telephone follow up appointment with care management team member scheduled for: 05/08/23  Verna Czech, LCSW [place]  [facility], [facility] Licensed Clinical Social Worker Care Coordinator  Direct Dial: [phone]

## 2023-04-24 NOTE — Progress Notes (Signed)
 Patient presented with suicidal thoughts. Behavioral assessment ordered to help determine further disposition.

## 2023-04-24 NOTE — Patient Outreach (Signed)
 Care Coordination   Initial Visit Note   04/24/2023 Name: Anne [name] MRN: [id number] DOB: 1950/11/25  Anne [name] is a 73 y.o. year old female who sees Birdie Sons, Yehuda Mao, MD (Inactive) for primary care. I spoke with  Anne [name]'s spouse by phone today.  What matters to the patients health and wellness today?  Patient's spouse denies plan for facility care for patient.  Would like to keep her in the home as long as possible. Experience with past hospitalization discussed-agreeable to [employer] referral through insurance as well as possible resumption of home health services.  CSW will continue to assist patient with available resources.   Goals Addressed             This Visit's Progress    Community resource needs       Activities and task to complete in order to accomplish goals.   Call or go to Department of Social Services to follow up on status of Medicaid application for long term care Call your insurance provider for more information about your Enhanced Benefits ( ie Psychologist, forensic program) CSW to follow up on resumption of [facility] services with [name] Keep all upcoming appointment discussed today Continue with compliance of taking medication prescribed by Doctor Contact your provider with any new questions or concerns Referral for Palliative care to be considered          SDOH assessments and interventions completed:  Yes  SDOH Interventions Today    Flowsheet Row Most Recent Value  SDOH Interventions   Food Insecurity Interventions Intervention Not Indicated  Housing Interventions Intervention Not Indicated  Transportation Interventions Intervention Not Indicated  Utilities Interventions Intervention Not Indicated        Care Coordination Interventions:  Yes, provided  Interventions Today    Flowsheet Row Most Recent Value  Chronic Disease   Chronic disease during today's visit Other  [dementia]  General Interventions   General Interventions  Discussed/Reviewed General Interventions Discussed, [employer], Level of Care  [patient assessed for community resource needs-spouse confirms that patient has challenges with "sundowners". Wanders frequently, becomes disorinted often, minimal support in the home-spouse is primary caregiver and would like additional support]  Level of Care Applications  [patient's spouse declines facility placement-would like to have pt remain in the home as long as possible with assistance if available-hx of HH services with [name]]  Applications Medicaid, Personal Care Services  [Medicaid application pending-pt's spouse agreeable to follow up with financial counselor for status update]  Exercise Interventions   Exercise Discussed/Reviewed Exercise Discussed  [Pt hx of receiving HH PT with [name]-CSW will follow up with resumption of services]  Education Interventions   Education Provided Provided Education  Provided Verbal Education On Community Resources  [[place] [employer] referral]  Applications Medicaid, Personal Care Services  [Medicaid application pending-pt's spouse agreeable to follow up with financial counselor for status update]  Safety Interventions   Safety Discussed/Reviewed Safety Discussed  [APS involved- worker Anne [name] [phone] will follow up]       Follow up plan: Follow up call scheduled for 05/08/23    Encounter Outcome:  Patient Visit Completed

## 2023-04-30 ENCOUNTER — Telehealth: Payer: Self-pay

## 2023-04-30 NOTE — Telephone Encounter (Signed)
 Copied from CRM [id number]. Topic: Clinical - Home Health Verbal Orders >> Apr 30, 2023  2:41 PM Pascal Lux wrote: Caller/Agency: [facility] Callback Number: [phone]  Service Requested: Skilled Nursing Frequency: Request to discharge patient due to noncompliance Any new concerns about the patient? No

## 2023-04-30 NOTE — Telephone Encounter (Signed)
 Is it okay to give the verbal okay to discharge pt due to noncompliance

## 2023-05-01 NOTE — Telephone Encounter (Signed)
 Verbal order for discharge was given.

## 2023-05-06 ENCOUNTER — Emergency Department
Admission: EM | Admit: 2023-05-06 | Discharge: 2023-05-10 | Disposition: A | Discharge status: Home / Self Care | Attending: Emergency Medicine | Admitting: Emergency Medicine

## 2023-05-06 ENCOUNTER — Other Ambulatory Visit: Payer: Self-pay

## 2023-05-06 DIAGNOSIS — R456 Violent behavior: Secondary | ICD-10-CM | POA: Diagnosis not present

## 2023-05-06 DIAGNOSIS — F03911 Unspecified dementia, unspecified severity, with agitation: Secondary | ICD-10-CM | POA: Diagnosis not present

## 2023-05-06 DIAGNOSIS — Z1152 Encounter for screening for COVID-19: Secondary | ICD-10-CM | POA: Diagnosis not present

## 2023-05-06 DIAGNOSIS — F03918 Unspecified dementia, unspecified severity, with other behavioral disturbance: Secondary | ICD-10-CM | POA: Diagnosis not present

## 2023-05-06 DIAGNOSIS — J439 Emphysema, unspecified: Secondary | ICD-10-CM | POA: Diagnosis not present

## 2023-05-06 DIAGNOSIS — R45851 Suicidal ideations: Secondary | ICD-10-CM | POA: Insufficient documentation

## 2023-05-06 DIAGNOSIS — F0390 Unspecified dementia without behavioral disturbance: Secondary | ICD-10-CM

## 2023-05-06 DIAGNOSIS — S022XXA Fracture of nasal bones, initial encounter for closed fracture: Secondary | ICD-10-CM | POA: Diagnosis not present

## 2023-05-06 DIAGNOSIS — I10 Essential (primary) hypertension: Secondary | ICD-10-CM | POA: Diagnosis not present

## 2023-05-06 DIAGNOSIS — R4689 Other symptoms and signs involving appearance and behavior: Secondary | ICD-10-CM

## 2023-05-06 DIAGNOSIS — S0083XA Contusion of other part of head, initial encounter: Secondary | ICD-10-CM | POA: Diagnosis not present

## 2023-05-06 DIAGNOSIS — I6782 Cerebral ischemia: Secondary | ICD-10-CM | POA: Diagnosis not present

## 2023-05-06 DIAGNOSIS — Z85118 Personal history of other malignant neoplasm of bronchus and lung: Secondary | ICD-10-CM | POA: Diagnosis not present

## 2023-05-06 DIAGNOSIS — F0393 Unspecified dementia, unspecified severity, with mood disturbance: Secondary | ICD-10-CM | POA: Insufficient documentation

## 2023-05-06 DIAGNOSIS — I700 Atherosclerosis of aorta: Secondary | ICD-10-CM | POA: Insufficient documentation

## 2023-05-06 DIAGNOSIS — S0992XA Unspecified injury of nose, initial encounter: Secondary | ICD-10-CM | POA: Diagnosis present

## 2023-05-06 DIAGNOSIS — F32A Depression, unspecified: Secondary | ICD-10-CM | POA: Diagnosis not present

## 2023-05-06 LAB — CBC
HCT: 41.7 % (ref 36.0–46.0)
Hemoglobin: 13 g/dL (ref 12.0–15.0)
MCH: 28.6 pg (ref 26.0–34.0)
MCHC: 31.2 g/dL (ref 30.0–36.0)
MCV: 91.6 fL (ref 80.0–100.0)
Platelets: 148 10*3/uL — ABNORMAL LOW (ref 150–400)
RBC: 4.55 MIL/uL (ref 3.87–5.11)
RDW: 15 % (ref 11.5–15.5)
WBC: 5.2 10*3/uL (ref 4.0–10.5)
nRBC: 0 % (ref 0.0–0.2)

## 2023-05-06 LAB — URINE DRUG SCREEN, QUALITATIVE (ARMC ONLY)
Amphetamines, Ur Screen: NOT DETECTED
Barbiturates, Ur Screen: NOT DETECTED
Benzodiazepine, Ur Scrn: NOT DETECTED
Cannabinoid 50 Ng, Ur ~~LOC~~: NOT DETECTED
Cocaine Metabolite,Ur ~~LOC~~: NOT DETECTED
MDMA (Ecstasy)Ur Screen: NOT DETECTED
Methadone Scn, Ur: NOT DETECTED
Opiate, Ur Screen: NOT DETECTED
Phencyclidine (PCP) Ur S: NOT DETECTED
Tricyclic, Ur Screen: NOT DETECTED

## 2023-05-06 LAB — COMPREHENSIVE METABOLIC PANEL WITH GFR
ALT: 15 U/L (ref 0–44)
AST: 23 U/L (ref 15–41)
Albumin: 3.8 g/dL (ref 3.5–5.0)
Alkaline Phosphatase: 74 U/L (ref 38–126)
Anion gap: 10 (ref 5–15)
BUN: 17 mg/dL (ref 8–23)
CO2: 30 mmol/L (ref 22–32)
Calcium: 9.2 mg/dL (ref 8.9–10.3)
Chloride: 105 mmol/L (ref 98–111)
Creatinine, Ser: 0.83 mg/dL (ref 0.44–1.00)
GFR, Estimated: >60 mL/min (ref >60)
Glucose, Bld: 99 mg/dL (ref 70–99)
Potassium: 4 mmol/L (ref 3.5–5.1)
Sodium: 145 mmol/L (ref 135–145)
Total Bilirubin: 0.4 mg/dL (ref 0.0–1.2)
Total Protein: 7.2 g/dL (ref 6.5–8.1)

## 2023-05-06 LAB — URINALYSIS, W/ REFLEX TO CULTURE (INFECTION SUSPECTED)
Bilirubin Urine: NEGATIVE
Glucose, UA: NEGATIVE mg/dL
Hgb urine dipstick: NEGATIVE
Ketones, ur: NEGATIVE mg/dL
Nitrite: NEGATIVE
Protein, ur: NEGATIVE mg/dL
Specific Gravity, Urine: 1.026 (ref 1.005–1.030)
pH: 5 (ref 5.0–8.0)

## 2023-05-06 LAB — ACETAMINOPHEN LEVEL: Acetaminophen (Tylenol), Serum: <10 ug/mL — ABNORMAL LOW (ref 10–30)

## 2023-05-06 LAB — SALICYLATE LEVEL: Salicylate Lvl: <7 mg/dL — ABNORMAL LOW (ref 7.0–30.0)

## 2023-05-06 LAB — ETHANOL: Alcohol, Ethyl (B): <10 mg/dL (ref <10)

## 2023-05-06 MED ORDER — QUETIAPINE FUMARATE 25 MG PO TABS
50.0000 mg | ORAL_TABLET | Freq: Every day | ORAL | Status: DC
  Administered 2023-05-06 – 2023-05-09 (×4): 50 mg via ORAL
  Filled 2023-05-06 (×4): qty 2

## 2023-05-06 MED ORDER — SERTRALINE HCL 50 MG PO TABS
100.0000 mg | ORAL_TABLET | Freq: Every day | ORAL | Status: DC
  Administered 2023-05-07: 100 mg via ORAL
  Filled 2023-05-06 (×2): qty 2

## 2023-05-06 MED ORDER — OLANZAPINE 5 MG PO TABS
2.5000 mg | ORAL_TABLET | Freq: Three times a day (TID) | ORAL | Status: DC | PRN
Start: 2023-05-06 — End: 2023-05-10
  Administered 2023-05-09: 2.5 mg via ORAL
  Filled 2023-05-06: qty 1

## 2023-05-06 MED ORDER — OLANZAPINE 10 MG IM SOLR
2.5000 mg | Freq: Once | INTRAMUSCULAR | Status: AC
  Administered 2023-05-06: 2.5 mg via INTRAMUSCULAR
  Filled 2023-05-06: qty 10

## 2023-05-06 NOTE — ED Notes (Addendum)
 Patient yelling she is leaving and her husband and daughter are out in the waiting room. 

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
## 2023-05-06 NOTE — ED Notes (Signed)
 IVC PENDING  CONSULT ?

## 2023-05-06 NOTE — ED Provider Triage Note (Signed)
 Emergency Medicine Provider Triage Evaluation Note  Anne [name] , a 73 y.o. female  was evaluated in triage.  Pt complains of needing psychiatric evaluation. Patient was brought voluntarily by [employer] as they found her wandering the road stating she wanted to be hit by a car. Patient states she has been med compliant.   Patient had SI earlier but denies it at this point.   Review of Systems  Positive:  Negative: SI, HI, AVH  Physical Exam  There were no vitals taken for this visit. Gen:   Awake, no distress   Resp:  Normal effort  MSK:   Moves extremities without difficulty  Other:    Medical Decision Making  Medically screening exam initiated at 4:00 PM.  Appropriate orders placed.  Anne [name] was informed that the remainder of the evaluation will be completed by another provider, this initial triage assessment does not replace that evaluation, and the importance of remaining in the ED until their evaluation is complete.     Cameron Ali, PA-C 05/06/23 [phone]

## 2023-05-06 NOTE — ED Notes (Addendum)
 Pt dressed out by this RN and Edt:  1 red shirt  1 black shirt  1 pair red shoes  1 pair red underwear
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
## 2023-05-06 NOTE — BH Assessment (Signed)
 TTS spoke with IRIS via phone and secure chat to place Psych Consult.

## 2023-05-06 NOTE — ED Notes (Signed)
 Patient wanting to see husband. Informed patient is not at the hospital at this time and do not even know if he is aware she is at the hospital since she was brought in by [employer] wandering the road and stating she "wanted to get hit by a car." Informed patient I could call husband and update him. Called husband and gave him an update patient is currently IVC'd and will need to wait until seen by psychiatric team and is evaluated. Husband stated he was going to come visit and informed him the same visiting rules apply as the past he can be here for 15 minutes until 1800. Informed patient husband was going to come visit.

## 2023-05-06 NOTE — ED Triage Notes (Signed)
 Pt to ED via [employer]- voluntarily, per officer pt was wondering around in the road stating that she wanted to get hit by a car to kill herself. Pt has hx of dementia and is a poor historian.
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
## 2023-05-06 NOTE — ED Notes (Signed)
 Husband came and visited patient. Patient calm and cooperative now after speaking with husband face to face.

## 2023-05-07 ENCOUNTER — Encounter: Payer: Self-pay | Admitting: Oncology

## 2023-05-07 NOTE — ED Notes (Signed)
Refused shower. 

## 2023-05-07 NOTE — ED Notes (Signed)
 Ivc  pending  TOC

## 2023-05-07 NOTE — TOC Initial Note (Signed)
 Transition of Care [facility]) - Initial/Assessment Note    Patient Details  Name: Anne [name] MRN: [id number] Date of Birth: Mar 26, 1950  Transition of Care [facility]) CM/SW Contact:    Elberta Fortis, RN Phone Number: 05/07/2023, 4:36 PM  Clinical Narrative:                 Have gone to talk to the pt twice today but she's been sleeping both times. ED RN states this pt has been here several times after being dropped off by her spouse when she has aggressive behaviors. Called Spouse Johnnie and was no answer or unable to leave a message. Also tried calling daughter [name] and was no answer. TOC to continue to follow.         Patient Goals and CMS Choice            Expected Discharge Plan and Services                                              Prior Living Arrangements/Services                       Activities of Daily Living      Permission Sought/Granted                  Emotional Assessment              Admission diagnosis:  Psych Eval Patient Active Problem List   Diagnosis Date Noted   Aggressive behavior 05/06/2023   Dementia with agitation (HCC) 04/05/2023   Suicidal ideation 03/24/2023   Altered mental status 03/15/2023   Abnormal urinalysis 03/15/2023   Weakness 02/26/2023   Acute urinary retention 02/05/2023   Seizure (HCC) 01/22/2023   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/22/2023   Tremor 11/23/2022   Urinary urgency 03/26/2022   Sore throat 03/07/2022   Seizure-like activity (HCC) 02/08/2022   Seborrheic keratosis 09/20/2021   Closed fracture of nasal bones 07/26/2021   Falls frequently 07/26/2021   Head injury 07/07/2021   Left hip pain 07/07/2021   Lipoma 07/07/2021   Slurred speech 06/07/2021   Subdural hematoma (HCC) 01/23/2021   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   Aortic atherosclerosis (HCC) 08/24/2020   Dementia with behavioral disturbance (HCC) 03/14/2020   Left shoulder pain  07/22/2019   Hypothyroidism 02/16/2019   Goals of care, counseling/discussion 08/06/2018   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), in remission, 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   Skin tear of forearm without complication, initial encounter 12/06/2017   Bruising 12/06/2017   Overweight 08/05/2017   Anemia 01/23/2017   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 [name] [address] Phone: [phone] Fax: [phone]  [facility] [address] [name] [address] Phone: [phone] Fax: [phone]     Social Drivers of Health (SDOH) Social History: SDOH Screenings   Food  Insecurity: No Food Insecurity (04/24/2023)  Housing: Low Risk  (04/24/2023)  Transportation Needs: No Transportation Needs (04/24/2023)  Utilities: Not At Risk (04/24/2023)  Alcohol Screen: Low Risk  (02/22/2023)  Depression (PHQ2-9): High Risk (06/21/2022)  Financial Resource Strain: Low Risk  (02/22/2023)  Physical Activity: Inactive (02/22/2023)  Social Connections: Moderately Isolated (02/20/2023)  Stress: Patient Unable To Answer (02/22/2023)  Tobacco Use: Medium Risk (05/06/2023)  Health Literacy: Patient Unable To Answer (02/22/2023)   SDOH Interventions:     Readmission Risk Interventions     No data to display

## 2023-05-07 NOTE — ED Notes (Signed)
 Patient given dinner tray at this time.

## 2023-05-07 NOTE — ED Notes (Signed)
 ivc/consult done/Per Psyc NP Candis Shine patient is psyc cleared/dispo pending.

## 2023-05-07 NOTE — ED Notes (Signed)
Breakfast placed at bedside. 

## 2023-05-07 NOTE — ED Notes (Signed)
 Hospital meal provided.  100% consumed, pt tolerated w/o complaints.  Waste discarded appropriately.

## 2023-05-07 NOTE — ED Provider Notes (Signed)
-----------------------------------------   1:49 AM on 05/07/2023 -----------------------------------------   Patient cleared by psychiatry who recommends [facility] consult.   Irean Hong, MD 05/07/23 [phone]

## 2023-05-08 ENCOUNTER — Telehealth: Payer: Self-pay | Admitting: *Deleted

## 2023-05-08 ENCOUNTER — Encounter: Payer: Self-pay | Admitting: *Deleted

## 2023-05-08 MED ORDER — RIVASTIGMINE TARTRATE 3 MG PO CAPS
3.0000 mg | ORAL_CAPSULE | Freq: Two times a day (BID) | ORAL | Status: DC
  Administered 2023-05-08 – 2023-05-10 (×5): 3 mg via ORAL
  Filled 2023-05-08 (×5): qty 1

## 2023-05-08 MED ORDER — LEVETIRACETAM 500 MG PO TABS
1000.0000 mg | ORAL_TABLET | Freq: Two times a day (BID) | ORAL | Status: DC
  Administered 2023-05-08 – 2023-05-10 (×5): 1000 mg via ORAL
  Filled 2023-05-08 (×5): qty 2

## 2023-05-08 MED ORDER — SERTRALINE HCL 50 MG PO TABS
150.0000 mg | ORAL_TABLET | Freq: Every day | ORAL | Status: DC
Start: 2023-05-08 — End: 2023-05-10
  Administered 2023-05-08 – 2023-05-10 (×3): 150 mg via ORAL
  Filled 2023-05-08 (×3): qty 3

## 2023-05-08 MED ORDER — LEVOTHYROXINE SODIUM 112 MCG PO TABS
112.0000 ug | ORAL_TABLET | Freq: Every day | ORAL | Status: DC
  Administered 2023-05-08 – 2023-05-10 (×3): 112 ug via ORAL
  Filled 2023-05-08 (×5): qty 1

## 2023-05-08 MED ORDER — MEMANTINE HCL 5 MG PO TABS
10.0000 mg | ORAL_TABLET | Freq: Two times a day (BID) | ORAL | Status: DC
  Administered 2023-05-08 – 2023-05-10 (×5): 10 mg via ORAL
  Filled 2023-05-08 (×5): qty 2

## 2023-05-08 MED ORDER — ROSUVASTATIN CALCIUM 20 MG PO TABS
20.0000 mg | ORAL_TABLET | Freq: Every day | ORAL | Status: DC
  Administered 2023-05-08 – 2023-05-10 (×3): 20 mg via ORAL
  Filled 2023-05-08 (×3): qty 1

## 2023-05-08 MED ORDER — QUETIAPINE FUMARATE 25 MG PO TABS
50.0000 mg | ORAL_TABLET | Freq: Every day | ORAL | Status: DC
  Filled 2023-05-08: qty 2

## 2023-05-08 NOTE — ED Provider Notes (Signed)
-----------------------------------------   6:42 AM on 05/08/2023 -----------------------------------------   Blood pressure (!) 130/59, pulse 76, temperature 98.4 F (36.9 C), temperature source Oral, resp. rate 18, SpO2 90%.  The patient is calm and cooperative at this time.  There have been no acute events since the last update.  Awaiting disposition plan from case management/social work.    Shena Vinluan, Layla Maw, DO 05/08/23 [phone]

## 2023-05-08 NOTE — ED Notes (Signed)
 Patient provided snack at appropriate snack time.  Pt consumed 100% of snack provided, tolerated well w/o complaints

## 2023-05-08 NOTE — ED Notes (Signed)
 Notified Dr. Elesa Massed, pt's home medications had not be ordered yet. Medication reconciliation done on 4/1 per chart.

## 2023-05-08 NOTE — Patient Outreach (Signed)
 Care Coordination   05/08/2023 Name: IEESHA ABBASI MRN: [id number] DOB: 02/07/1950   Care Coordination Outreach Attempts:  An unsuccessful telephone outreach was attempted today to offer the patient information about available complex care management services.  Follow Up Plan:  Additional outreach attempts will be made to offer the patient complex care management information and services.   Encounter Outcome:  No Answer   Care Coordination Interventions:  No, not indicated    Axl Rodino, LCSW [place]  [facility], [facility] Licensed Clinical Social Worker Care Coordinator  Direct Dial: [phone]

## 2023-05-08 NOTE — ED Notes (Signed)
 Hospital meal provided.  100% consumed, pt tolerated w/o complaints.  Waste discarded appropriately.

## 2023-05-08 NOTE — ED Notes (Signed)
 Attempted to give pt levothyroxine at this time, dose landed on the floor. Messaged pharmacy for another dose to be sent at this time.

## 2023-05-08 NOTE — ED Notes (Signed)
 This tech obtained vital signs on pt.

## 2023-05-08 NOTE — TOC Progression Note (Signed)
 Transition of Care [facility]) - Progression Note    Patient Details  Name: Anne [name] MRN: [id number] Date of Birth: July 13, 1950  Transition of Care [facility]) CM/SW Contact  Elberta Fortis, RN Phone Number: 05/08/2023, 10:32 AM  Clinical Narrative:    Spoke to her spouse Terease Marcotte. Pt lives at home alone with spouse. He reports the HHC arranged at last admission never called or came by. He reports pt does well during the day but then sundowns and that's when she becomes difficult to care for. She has a tendency to wander off and can become agitated. Ideally spouse wants her placed in a memory care unit but is worried about the financials. He states she didn't qualify for medicaid as they make a little too much money from social security. Vivi Martens has been assisting with applying for medicaid. TOC to continue to follow       Expected Discharge Plan and Services                                               Social Determinants of Health (SDOH) Interventions SDOH Screenings   Food Insecurity: No Food Insecurity (04/24/2023)  Housing: Low Risk  (04/24/2023)  Transportation Needs: No Transportation Needs (04/24/2023)  Utilities: Not At Risk (04/24/2023)  Alcohol Screen: Low Risk  (02/22/2023)  Depression (PHQ2-9): High Risk (06/21/2022)  Financial Resource Strain: Low Risk  (02/22/2023)  Physical Activity: Inactive (02/22/2023)  Social Connections: Moderately Isolated (02/20/2023)  Stress: Patient Unable To Answer (02/22/2023)  Tobacco Use: Medium Risk (05/06/2023)  Health Literacy: Patient Unable To Answer (02/22/2023)    Readmission Risk Interventions     No data to display

## 2023-05-08 NOTE — ED Notes (Signed)
 IVC/ Pending TOC

## 2023-05-08 NOTE — ED Notes (Signed)
 Hospital meal provided, pt tolerated w/o complaints.  Waste discarded appropriately.

## 2023-05-08 NOTE — ED Notes (Signed)
 Breakfast provided.

## 2023-05-08 NOTE — ED Notes (Signed)
 Patient speaking with husband. Husband wanted to talk to this RN and gave him an update on POC.

## 2023-05-08 NOTE — ED Notes (Signed)
IVC  TOC  PLACEMENT 

## 2023-05-08 NOTE — TOC Transition Note (Signed)
 Transition of Care [facility]) - Discharge Note   Patient Details  Name: Anne [name] MRN: [id number] Date of Birth: Jun 15, 1950  Transition of Care [facility]) CM/SW Contact:  Elberta Fortis, RN Phone Number: 05/08/2023, 12:55 PM   Clinical Narrative:    Met with patient in her hospital room. She's pleasant and conversive. She's unable to say where she is or the year, and remembers she's married. She doesn't remember why she was brought in. She needs placement in a memory care unit for safety. TOC to continue to follow.          Patient Goals and CMS Choice            Discharge Placement                       Discharge Plan and Services Additional resources added to the After Visit Summary for                                       Social Drivers of Health (SDOH) Interventions SDOH Screenings   Food Insecurity: No Food Insecurity (04/24/2023)  Housing: Low Risk  (04/24/2023)  Transportation Needs: No Transportation Needs (04/24/2023)  Utilities: Not At Risk (04/24/2023)  Alcohol Screen: Low Risk  (02/22/2023)  Depression (PHQ2-9): High Risk (06/21/2022)  Financial Resource Strain: Low Risk  (02/22/2023)  Physical Activity: Inactive (02/22/2023)  Social Connections: Moderately Isolated (02/20/2023)  Stress: Patient Unable To Answer (02/22/2023)  Tobacco Use: Medium Risk (05/06/2023)  Health Literacy: Patient Unable To Answer (02/22/2023)     Readmission Risk Interventions     No data to display

## 2023-05-08 NOTE — ED Notes (Signed)
 Report received from [place], [place]

## 2023-05-09 NOTE — ED Notes (Signed)
 Pt in hallway, walking around/ pt was asked to return to room/ pt is saying saying she is going home/ this nurse explained to pt that her family is picking her up in the morning

## 2023-05-09 NOTE — ED Notes (Signed)
 Pt only wanted a beverage tonight for snack.

## 2023-05-09 NOTE — ED Provider Notes (Signed)
-----------------------------------------   3:21 AM on 05/09/2023 -----------------------------------------   Blood pressure 124/76, pulse 84, temperature 98.2 F (36.8 C), temperature source Oral, resp. rate 17, SpO2 91%.  The patient is calm and cooperative at this time.  There have been no acute events since the last update.  Awaiting disposition plan from [facility] team.   Janith Lima, MD 05/09/23 [phone]

## 2023-05-09 NOTE — ED Notes (Signed)
IVC/Pending TOC Placement 
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
## 2023-05-09 NOTE — ED Notes (Signed)
IVC  TOC  PLACEMENT 

## 2023-05-09 NOTE — TOC Progression Note (Signed)
 Transition of Care [facility]) - Progression Note    Patient Details  Name: Anne [name] MRN: [id number] Date of Birth: Nov 26, 1950  Transition of Care [facility]) CM/SW Contact  Elberta Fortis, RN Phone Number: 05/09/2023, 12:20 PM  Clinical Narrative:    Spoke with Maralyn Sago, CSW, and she states Fanny Skates was interested in taking [place] when she was last admitted. Called admissions and left a message requesting a call back.         Expected Discharge Plan and Services                                               Social Determinants of Health (SDOH) Interventions SDOH Screenings   Food Insecurity: No Food Insecurity (04/24/2023)  Housing: Low Risk  (04/24/2023)  Transportation Needs: No Transportation Needs (04/24/2023)  Utilities: Not At Risk (04/24/2023)  Alcohol Screen: Low Risk  (02/22/2023)  Depression (PHQ2-9): High Risk (06/21/2022)  Financial Resource Strain: Low Risk  (02/22/2023)  Physical Activity: Inactive (02/22/2023)  Social Connections: Moderately Isolated (02/20/2023)  Stress: Patient Unable To Answer (02/22/2023)  Tobacco Use: Medium Risk (05/06/2023)  Health Literacy: Patient Unable To Answer (02/22/2023)    Readmission Risk Interventions     No data to display

## 2023-05-09 NOTE — TOC Progression Note (Signed)
 Transition of Care [facility]) - Progression Note    Patient Details  Name: Anne [name] MRN: [id number] Date of Birth: [birth year]  Transition of Care [facility]) CM/SW Contact  Elberta Fortis, RN Phone Number: 05/09/2023, 4:13 PM  Clinical Narrative:    Patient is ready for discharge. She doesn't qualify for Medicaid and doesn't have the financial means to pay for a memory care unit. Have tried calling her spouse Dayton Scrape twice today on both numbers but he hasn't answered. He was here visiting [place] and I wasn't notified he was here and missed him. Called and left a message that [name] will be discharged tomorrow morning and to please plan on picking her up then. TOC will continue to follow.         Expected Discharge Plan and Services                                               Social Determinants of Health (SDOH) Interventions SDOH Screenings   Food Insecurity: No Food Insecurity (04/24/2023)  Housing: Low Risk  (04/24/2023)  Transportation Needs: No Transportation Needs (04/24/2023)  Utilities: Not At Risk (04/24/2023)  Alcohol Screen: Low Risk  (02/22/2023)  Depression (PHQ2-9): High Risk (06/21/2022)  Financial Resource Strain: Low Risk  (02/22/2023)  Physical Activity: Inactive (02/22/2023)  Social Connections: Moderately Isolated (02/20/2023)  Stress: Patient Unable To Answer (02/22/2023)  Tobacco Use: Medium Risk (05/06/2023)  Health Literacy: Patient Unable To Answer (02/22/2023)    Readmission Risk Interventions     No data to display
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
## 2023-05-09 NOTE — ED Notes (Signed)
Patient received dinner tray 

## 2023-05-09 NOTE — ED Notes (Signed)
 Pt provided with lunch tray.

## 2023-05-09 NOTE — ED Notes (Signed)
 This tech obtained vital signs on pt.

## 2023-05-10 ENCOUNTER — Emergency Department
Admission: EM | Admit: 2023-05-10 | Discharge: 2023-05-23 | Disposition: A | Discharge status: Home / Self Care | Source: Home / Self Care | Attending: Emergency Medicine | Admitting: Emergency Medicine

## 2023-05-10 ENCOUNTER — Emergency Department

## 2023-05-10 ENCOUNTER — Other Ambulatory Visit: Payer: Self-pay

## 2023-05-10 DIAGNOSIS — R4689 Other symptoms and signs involving appearance and behavior: Secondary | ICD-10-CM

## 2023-05-10 DIAGNOSIS — S0083XA Contusion of other part of head, initial encounter: Secondary | ICD-10-CM | POA: Insufficient documentation

## 2023-05-10 DIAGNOSIS — I10 Essential (primary) hypertension: Secondary | ICD-10-CM | POA: Insufficient documentation

## 2023-05-10 DIAGNOSIS — Z85118 Personal history of other malignant neoplasm of bronchus and lung: Secondary | ICD-10-CM | POA: Insufficient documentation

## 2023-05-10 DIAGNOSIS — F03918 Unspecified dementia, unspecified severity, with other behavioral disturbance: Secondary | ICD-10-CM | POA: Insufficient documentation

## 2023-05-10 DIAGNOSIS — S022XXA Fracture of nasal bones, initial encounter for closed fracture: Secondary | ICD-10-CM | POA: Insufficient documentation

## 2023-05-10 MED ORDER — LEVETIRACETAM 500 MG PO TABS
1000.0000 mg | ORAL_TABLET | Freq: Two times a day (BID) | ORAL | Status: DC
  Administered 2023-05-10 – 2023-05-23 (×26): 1000 mg via ORAL
  Filled 2023-05-10 (×26): qty 2

## 2023-05-10 MED ORDER — SERTRALINE HCL 50 MG PO TABS
150.0000 mg | ORAL_TABLET | Freq: Every day | ORAL | Status: DC
  Administered 2023-05-11 – 2023-05-23 (×13): 150 mg via ORAL
  Filled 2023-05-10 (×13): qty 3

## 2023-05-10 MED ORDER — MEMANTINE HCL 5 MG PO TABS
10.0000 mg | ORAL_TABLET | Freq: Two times a day (BID) | ORAL | Status: DC
  Administered 2023-05-10 – 2023-05-23 (×26): 10 mg via ORAL
  Filled 2023-05-10 (×27): qty 2

## 2023-05-10 MED ORDER — ZIPRASIDONE MESYLATE 20 MG IM SOLR
10.0000 mg | Freq: Once | INTRAMUSCULAR | Status: AC
  Administered 2023-05-10: 10 mg via INTRAMUSCULAR
  Filled 2023-05-10: qty 20

## 2023-05-10 MED ORDER — SERTRALINE HCL 50 MG PO TABS
150.0000 mg | ORAL_TABLET | Freq: Every day | ORAL | 2 refills | Status: DC
Start: 2023-05-10 — End: 2023-09-27

## 2023-05-10 MED ORDER — HALOPERIDOL LACTATE 5 MG/ML IJ SOLN
5.0000 mg | Freq: Once | INTRAMUSCULAR | Status: AC
Start: 2023-05-10 — End: 2023-05-10
  Administered 2023-05-10: 5 mg via INTRAMUSCULAR
  Filled 2023-05-10: qty 1

## 2023-05-10 MED ORDER — RIVASTIGMINE TARTRATE 3 MG PO CAPS
3.0000 mg | ORAL_CAPSULE | Freq: Two times a day (BID) | ORAL | Status: DC
  Administered 2023-05-10 – 2023-05-23 (×25): 3 mg via ORAL
  Filled 2023-05-10 (×27): qty 1

## 2023-05-10 MED ORDER — LEVOTHYROXINE SODIUM 112 MCG PO TABS
112.0000 ug | ORAL_TABLET | Freq: Every day | ORAL | Status: DC
  Administered 2023-05-11 – 2023-05-23 (×13): 112 ug via ORAL
  Filled 2023-05-10 (×15): qty 1

## 2023-05-10 MED ORDER — MEMANTINE HCL 10 MG PO TABS: 10.0000 mg | ORAL_TABLET | Freq: Two times a day (BID) | ORAL | 2 refills | Status: DC

## 2023-05-10 MED ORDER — QUETIAPINE FUMARATE 25 MG PO TABS
50.0000 mg | ORAL_TABLET | Freq: Every day | ORAL | Status: DC
  Administered 2023-05-10 – 2023-05-22 (×12): 50 mg via ORAL
  Filled 2023-05-10 (×13): qty 2

## 2023-05-10 MED ORDER — ROSUVASTATIN CALCIUM 20 MG PO TABS
20.0000 mg | ORAL_TABLET | Freq: Every day | ORAL | Status: DC
  Administered 2023-05-11 – 2023-05-23 (×13): 20 mg via ORAL
  Filled 2023-05-10 (×16): qty 1

## 2023-05-10 MED ORDER — QUETIAPINE FUMARATE 50 MG PO TABS: 50.0000 mg | ORAL_TABLET | Freq: Every day | ORAL | 2 refills | Status: DC

## 2023-05-10 NOTE — ED Notes (Signed)
 Pt up out of bed with both side rails up. Pt stating she wants to leave and go to the motel she is staying at with her husband. Explained to pt that she cannot do that. This EDT talked to pt for about 5 minutes to try and get pt back in bed. Pt refused. Mel, RN brought in to try and talk to pt as well. After unsuccessfully trying to verbally get pt back in bed, STARR was used to put pt back in bed. 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 Security at bed side assisting with this. Pt now back in bed.

## 2023-05-10 NOTE — Discharge Instructions (Addendum)
 Follow-up closely with your primary care physician and outpatient psychiatry.  Return to the emergency department at any point if you have any worsening or new symptoms.  You are given prescriptions for the 2 medications that were changed while she was in the emergency department and a prescription for her memantine.  Call her primary care physician for close follow-up.
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
## 2023-05-10 NOTE — TOC Progression Note (Signed)
 Transition of Care [facility]) - Progression Note    Patient Details  Name: NYRA ANSPAUGH MRN: [id number] Date of Birth: [birth year]  [facility]) CM/SW Contact  Colin Broach, LCSW Phone Number: 05/10/2023, 11:41 AM  Clinical Narrative:    CSW spoke with patient's husband and he will pick patient up in approximately 45 minutes from time of note.        Expected Discharge Plan and Services                                               Social Determinants of Health (SDOH) Interventions SDOH Screenings   Food Insecurity: No Food Insecurity (04/24/2023)  Housing: Low Risk  (04/24/2023)  Transportation Needs: No Transportation Needs (04/24/2023)  Utilities: Not At Risk (04/24/2023)  Alcohol Screen: Low Risk  (02/22/2023)  Depression (PHQ2-9): High Risk (06/21/2022)  Financial Resource Strain: Low Risk  (02/22/2023)  Physical Activity: Inactive (02/22/2023)  Social Connections: Moderately Isolated (02/20/2023)  Stress: Patient Unable To Answer (02/22/2023)  Tobacco Use: Medium Risk (05/06/2023)  Health Literacy: Patient Unable To Answer (02/22/2023)    Readmission Risk Interventions     No data to display
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
## 2023-05-10 NOTE — ED Provider Notes (Signed)
-----------------------------------------   6:30 AM on 05/10/2023 -----------------------------------------   Blood pressure 130/78, pulse 75, temperature 99 F (37.2 C), temperature source Oral, resp. rate 17, SpO2 92%.  The patient is calm and cooperative at this time.  Patient had 1 episode of agitation overnight requiring IM Haldol with improvement in symptoms.  Current plan is for family to pick her up on 4/4 morning.   Janith Lima, MD 05/10/23 [phone]
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
## 2023-05-10 NOTE — ED Notes (Signed)
 Lunch provided.

## 2023-05-10 NOTE — ED Notes (Signed)
 Pt defecated in the floor/ pt cleaned up and given new underwear and scrubs

## 2023-05-10 NOTE — ED Provider Notes (Addendum)
-----------------------------------------   7:25 AM on 05/10/2023 -----------------------------------------   Blood pressure 130/78, pulse 75, temperature 99 F (37.2 C), temperature source Oral, resp. rate 17, SpO2 92%.  The patient is calm and cooperative at this time.  There have been no acute events since the last update.  Awaiting disposition plan from case management/social work.  On review patient was cleared by psychiatry in 3/31 and did not meet criteria for inpatient hospitalization.  Patient was evaluated by social work and did not qualify for Medicaid and did not have the funds for paying for a memory care unit.  Patient currently denying any suicidal ideation, IVC was rescinded.  Does not meet inpatient criteria.    Patient has been managed on cetirizine and Seroquel.  Ultimately patient was discharged to her husband to go back home.  Will give a prescription for both her sertraline and Seroquel.  Discussed close follow-up as an outpatient with primary care provider and given return precautions for any worsening symptoms.      Corena Herter, MD 05/10/23 0272    Corena Herter, MD 05/10/23 [phone]

## 2023-05-10 NOTE — ED Notes (Signed)
 Hospital meal provided.  100% consumed, pt tolerated w/o complaints.  Waste discarded appropriately.

## 2023-05-10 NOTE — ED Notes (Addendum)
 Pt did not cooperative with repeat VS due to fixation on going home with husband, Anne [name] who is here to pickup pt. Pt alert, cooperative, appropriate for discharge. Husband at bedside of patient, voices understanding of discharge instructions and appropriate follow up if needed. Pt in NAD.

## 2023-05-10 NOTE — ED Notes (Signed)
 Pt is calmer and cooperating at this time

## 2023-05-10 NOTE — TOC Transition Note (Signed)
 Transition of Care [facility]) - Discharge Note   Patient Details  Name: Anne [name] MRN: [id number] Date of Birth: October 12, 1950  Transition of Care [facility]) CM/SW Contact:  Colin Broach, LCSW Phone Number: 05/10/2023, 4:31 PM   Clinical Narrative:   Patient discharged home with self care.  Patient transported home by her husband.  CSW signing off.    Final next level of care: Home/Self Care Barriers to Discharge: No Barriers Identified   Patient Goals and CMS Choice            Discharge Placement                       Discharge Plan and Services Additional resources added to the After Visit Summary for                                       Social Drivers of Health (SDOH) Interventions SDOH Screenings   Food Insecurity: No Food Insecurity (04/24/2023)  Housing: Low Risk  (04/24/2023)  Transportation Needs: No Transportation Needs (04/24/2023)  Utilities: Not At Risk (04/24/2023)  Alcohol Screen: Low Risk  (02/22/2023)  Depression (PHQ2-9): High Risk (06/21/2022)  Financial Resource Strain: Low Risk  (02/22/2023)  Physical Activity: Inactive (02/22/2023)  Social Connections: Moderately Isolated (02/20/2023)  Stress: Patient Unable To Answer (02/22/2023)  Tobacco Use: Medium Risk (05/06/2023)  Health Literacy: Patient Unable To Answer (02/22/2023)     Readmission Risk Interventions     No data to display
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
## 2023-05-11 DIAGNOSIS — F03918 Unspecified dementia, unspecified severity, with other behavioral disturbance: Secondary | ICD-10-CM | POA: Diagnosis not present

## 2023-05-11 LAB — CBC WITH DIFFERENTIAL/PLATELET
Abs Immature Granulocytes: 0.01 10*3/uL (ref 0.00–0.07)
Basophils Absolute: 0 10*3/uL (ref 0.0–0.1)
Basophils Relative: 1 %
Eosinophils Absolute: 0.1 10*3/uL (ref 0.0–0.5)
Eosinophils Relative: 2 %
HCT: 39.3 % (ref 36.0–46.0)
Hemoglobin: 12.3 g/dL (ref 12.0–15.0)
Immature Granulocytes: 0 %
Lymphocytes Relative: 20 %
Lymphs Abs: 1.2 10*3/uL (ref 0.7–4.0)
MCH: 28.6 pg (ref 26.0–34.0)
MCHC: 31.3 g/dL (ref 30.0–36.0)
MCV: 91.4 fL (ref 80.0–100.0)
Monocytes Absolute: 0.4 10*3/uL (ref 0.1–1.0)
Monocytes Relative: 7 %
Neutro Abs: 4.1 10*3/uL (ref 1.7–7.7)
Neutrophils Relative %: 70 %
Platelets: 111 10*3/uL — ABNORMAL LOW (ref 150–400)
RBC: 4.3 MIL/uL (ref 3.87–5.11)
RDW: 14.8 % (ref 11.5–15.5)
WBC: 5.9 10*3/uL (ref 4.0–10.5)
nRBC: 0 % (ref 0.0–0.2)

## 2023-05-11 LAB — COMPREHENSIVE METABOLIC PANEL WITH GFR
ALT: 19 U/L (ref 0–44)
AST: 32 U/L (ref 15–41)
Albumin: 3.6 g/dL (ref 3.5–5.0)
Alkaline Phosphatase: 68 U/L (ref 38–126)
Anion gap: 7 (ref 5–15)
BUN: 24 mg/dL — ABNORMAL HIGH (ref 8–23)
CO2: 28 mmol/L (ref 22–32)
Calcium: 9.4 mg/dL (ref 8.9–10.3)
Chloride: 106 mmol/L (ref 98–111)
Creatinine, Ser: 0.68 mg/dL (ref 0.44–1.00)
GFR, Estimated: >60 mL/min (ref >60)
Glucose, Bld: 99 mg/dL (ref 70–99)
Potassium: 3.7 mmol/L (ref 3.5–5.1)
Sodium: 141 mmol/L (ref 135–145)
Total Bilirubin: 0.4 mg/dL (ref 0.0–1.2)
Total Protein: 6.8 g/dL (ref 6.5–8.1)

## 2023-05-11 LAB — URINE DRUG SCREEN, QUALITATIVE (ARMC ONLY)
Amphetamines, Ur Screen: NOT DETECTED
Barbiturates, Ur Screen: NOT DETECTED
Benzodiazepine, Ur Scrn: NOT DETECTED
Cannabinoid 50 Ng, Ur ~~LOC~~: NOT DETECTED
Cocaine Metabolite,Ur ~~LOC~~: NOT DETECTED
MDMA (Ecstasy)Ur Screen: NOT DETECTED
Methadone Scn, Ur: NOT DETECTED
Opiate, Ur Screen: NOT DETECTED
Phencyclidine (PCP) Ur S: NOT DETECTED
Tricyclic, Ur Screen: NOT DETECTED

## 2023-05-11 LAB — URINALYSIS, ROUTINE W REFLEX MICROSCOPIC
Bacteria, UA: NONE SEEN
Bilirubin Urine: NEGATIVE
Glucose, UA: NEGATIVE mg/dL
Hgb urine dipstick: NEGATIVE
Ketones, ur: NEGATIVE mg/dL
Nitrite: NEGATIVE
Protein, ur: NEGATIVE mg/dL
Specific Gravity, Urine: 1.013 (ref 1.005–1.030)
pH: 5 (ref 5.0–8.0)

## 2023-05-11 LAB — ETHANOL: Alcohol, Ethyl (B): <10 mg/dL (ref <10)

## 2023-05-11 MED ORDER — ZIPRASIDONE MESYLATE 20 MG IM SOLR
INTRAMUSCULAR | Status: AC
Start: 2023-05-11 — End: 2023-05-11
  Administered 2023-05-11: 10 mg via INTRAMUSCULAR
  Filled 2023-05-11: qty 20

## 2023-05-11 MED ORDER — ZIPRASIDONE MESYLATE 20 MG IM SOLR: 10.0000 mg | Freq: Once | INTRAMUSCULAR | Status: AC

## 2023-05-11 MED ORDER — ZIPRASIDONE MESYLATE 20 MG IM SOLR
20.0000 mg | Freq: Once | INTRAMUSCULAR | Status: AC
  Administered 2023-05-11: 20 mg via INTRAMUSCULAR
  Filled 2023-05-11: qty 20
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
## 2023-05-11 NOTE — ED Notes (Signed)
IVC /psych consult pending 

## 2023-05-11 NOTE — Group Note (Deleted)
 Date:  05/11/2023 Time:  9:53 PM  Group Topic/Focus:  Wrap-Up Group:   The focus of this group is to help patients review their daily goal of treatment and discuss progress on daily workbooks.     Participation Level:  {BHH PARTICIPATION WUJWJ:19147}  Participation Quality:  {BHH PARTICIPATION QUALITY:22265}  Affect:  {BHH AFFECT:22266}  Cognitive:  {BHH COGNITIVE:22267}  Insight: {BHH Insight2:20797}  Engagement in Group:  {BHH ENGAGEMENT IN WGNFA:21308}  Modes of Intervention:  {BHH MODES OF INTERVENTION:22269}  Additional Comments:  ***  Maglione,Angell Honse E 05/11/2023, 9:53 PM
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
## 2023-05-11 NOTE — ED Notes (Signed)
 Pt provided with dinner tray.

## 2023-05-11 NOTE — ED Provider Notes (Signed)
-----------------------------------------   2:37 PM on 05/11/2023 ----------------------------------------- Patient is becoming increasingly agitated.  We will dose 20 mg of IM Geodon which has had good effect on the patient last night.  We will continue to closely monitor the patient.   Minna Antis, MD 05/11/23 1438

## 2023-05-11 NOTE — BH Assessment (Signed)
 [place]ris consult requested by TTS via telephone and secure chat at 02:25.
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
## 2023-05-11 NOTE — ED Notes (Signed)
 This RN sent SST with Pt labels down to lab.
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
## 2023-05-11 NOTE — ED Notes (Signed)
 Multiple attempts to collect urine from Pt. This RN had Pt sit on toilet x1 to attempt to provide sample, unsuccessful attempt.

## 2023-05-11 NOTE — ED Notes (Signed)
 Lab called to f/u on lab results. They will result soon.

## 2023-05-11 NOTE — ED Notes (Signed)
 Lunch tray given.
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
## 2023-05-11 NOTE — ED Notes (Signed)
 Lab called to f/u on CBC states will be resulted in 10 mins.

## 2023-05-11 NOTE — ED Notes (Signed)
 Breakfast tray provided for pt.
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
## 2023-05-11 NOTE — BH Assessment (Signed)
 Writer called and left a HIPPA Compliant message with daughter [name] [phone]), requesting a return phone call.

## 2023-05-12 MED ORDER — MIDAZOLAM HCL (PF) 10 MG/2ML IJ SOLN
INTRAMUSCULAR | Status: AC
  Administered 2023-05-12: 5 mg via NASAL
  Filled 2023-05-12: qty 2

## 2023-05-12 MED ORDER — MIDAZOLAM HCL 10 MG/2ML IJ SOLN
5.0000 mg | Freq: Once | INTRAMUSCULAR | Status: AC
  Filled 2023-05-12: qty 5
  Filled 2023-05-12: qty 1

## 2023-05-12 MED ORDER — ZIPRASIDONE MESYLATE 20 MG IM SOLR
10.0000 mg | Freq: Once | INTRAMUSCULAR | Status: AC
  Administered 2023-05-12: 10 mg via INTRAMUSCULAR
  Filled 2023-05-12: qty 20

## 2023-05-12 MED ORDER — ACETAMINOPHEN 325 MG PO TABS
650.0000 mg | ORAL_TABLET | Freq: Once | ORAL | Status: AC
  Administered 2023-05-12: 650 mg via ORAL

## 2023-05-12 MED ORDER — ACETAMINOPHEN 325 MG PO TABS
ORAL_TABLET | ORAL | Status: AC
  Filled 2023-05-12: qty 2

## 2023-05-12 NOTE — ED Notes (Signed)
 patient allowed staff to medicate with IM injection given by this RN

## 2023-05-12 NOTE — ED Provider Notes (Signed)
-----------------------------------------   5:39 PM on 05/12/2023 -----------------------------------------   Blood pressure 107/61, pulse 77, temperature 98.5 F (36.9 C), temperature source Oral, resp. rate 16, height 5\' 7"  (1.702 m), weight 66.2 kg, SpO2 95%.  The patient is calm and cooperative at this time.  Patient required 10 mg IM Geodon at beginning of shift.  Awaiting disposition plan from [facility] team.   Janith Lima, MD 05/12/23 1739
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
## 2023-05-12 NOTE — ED Notes (Signed)
 Pt given snack.

## 2023-05-12 NOTE — ED Notes (Signed)
 Hospital meal provided, pt tolerated w/o complaints.  Waste discarded appropriately.

## 2023-05-12 NOTE — ED Notes (Signed)
 This tech obtained vital signs on pt.

## 2023-05-12 NOTE — ED Notes (Signed)
IVC/Pending TOC Placement 

## 2023-05-13 NOTE — ED Notes (Signed)
 Breakfast tray and beverage provided by staff
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
## 2023-05-13 NOTE — ED Provider Notes (Signed)
-----------------------------------------   8:18 AM on 05/13/2023 -----------------------------------------   Blood pressure (!) 162/92, pulse 85, temperature 97.8 F (36.6 C), temperature source Oral, resp. rate 17, height 1.702 m (5\' 7" ), weight 66.2 kg, SpO2 94%.  The patient is calm and cooperative at this time.  There have been no acute events since the last update.  Awaiting disposition plan from [facility] team.   Loleta Rose, MD 05/13/23 [phone]

## 2023-05-13 NOTE — ED Notes (Signed)
IVC  TOC  PLACEMENT 

## 2023-05-13 NOTE — ED Notes (Signed)
 Pt refused PM snack at this time.

## 2023-05-14 ENCOUNTER — Emergency Department

## 2023-05-14 LAB — CBC WITH DIFFERENTIAL/PLATELET
Abs Immature Granulocytes: 0.01 10*3/uL (ref 0.00–0.07)
Basophils Absolute: 0.1 10*3/uL (ref 0.0–0.1)
Basophils Relative: 1 %
Eosinophils Absolute: 0.1 10*3/uL (ref 0.0–0.5)
Eosinophils Relative: 3 %
HCT: 41.7 % (ref 36.0–46.0)
Hemoglobin: 13.1 g/dL (ref 12.0–15.0)
Immature Granulocytes: 0 %
Lymphocytes Relative: 33 %
Lymphs Abs: 1.8 10*3/uL (ref 0.7–4.0)
MCH: 28.9 pg (ref 26.0–34.0)
MCHC: 31.4 g/dL (ref 30.0–36.0)
MCV: 92.1 fL (ref 80.0–100.0)
Monocytes Absolute: 0.4 10*3/uL (ref 0.1–1.0)
Monocytes Relative: 7 %
Neutro Abs: 3 10*3/uL (ref 1.7–7.7)
Neutrophils Relative %: 56 %
Platelets: 146 10*3/uL — ABNORMAL LOW (ref 150–400)
RBC: 4.53 MIL/uL (ref 3.87–5.11)
RDW: 14.9 % (ref 11.5–15.5)
WBC: 5.3 10*3/uL (ref 4.0–10.5)
nRBC: 0 % (ref 0.0–0.2)

## 2023-05-14 LAB — RESP PANEL BY RT-PCR (RSV, FLU A&B, COVID)  RVPGX2
Influenza A by PCR: NEGATIVE
Influenza B by PCR: NEGATIVE
Resp Syncytial Virus by PCR: NEGATIVE
SARS Coronavirus 2 by RT PCR: NEGATIVE

## 2023-05-14 LAB — BASIC METABOLIC PANEL WITH GFR
Anion gap: 11 (ref 5–15)
BUN: 31 mg/dL — ABNORMAL HIGH (ref 8–23)
CO2: 24 mmol/L (ref 22–32)
Calcium: 9.2 mg/dL (ref 8.9–10.3)
Chloride: 103 mmol/L (ref 98–111)
Creatinine, Ser: 0.83 mg/dL (ref 0.44–1.00)
GFR, Estimated: >60 mL/min (ref >60)
Glucose, Bld: 85 mg/dL (ref 70–99)
Potassium: 3.5 mmol/L (ref 3.5–5.1)
Sodium: 138 mmol/L (ref 135–145)

## 2023-05-14 LAB — TROPONIN I (HIGH SENSITIVITY): Troponin I (High Sensitivity): 3 ng/L (ref <18)

## 2023-05-14 LAB — D-DIMER, QUANTITATIVE: D-Dimer, Quant: 0.94 ug{FEU}/mL — ABNORMAL HIGH (ref 0.00–0.50)

## 2023-05-14 MED ORDER — ZIPRASIDONE MESYLATE 20 MG IM SOLR
10.0000 mg | Freq: Once | INTRAMUSCULAR | Status: AC
  Administered 2023-05-14: 10 mg via INTRAMUSCULAR
  Filled 2023-05-14: qty 20

## 2023-05-14 MED ORDER — IOHEXOL 350 MG/ML SOLN
75.0000 mL | Freq: Once | INTRAVENOUS | Status: AC | PRN
  Administered 2023-05-14: 75 mL via INTRAVENOUS

## 2023-05-14 NOTE — ED Notes (Signed)
 Dr Elesa Massed notified of pt's SpO2 being between 85% and 88% on RA. Pt placed on 2L [place] and back up to 94%.

## 2023-05-14 NOTE — ED Provider Notes (Signed)
 Nurse reports patient's oxygenation is staying above 90% without O2 however she is becoming more aggressive and will likely need as needed medication   Jene Every, MD 05/14/23 [phone]

## 2023-05-14 NOTE — ED Notes (Signed)
IVC  TOC  PLACEMENT 

## 2023-05-14 NOTE — ED Provider Notes (Signed)
-----------------------------------------   5:47 AM on 05/14/2023 -----------------------------------------   Blood pressure 120/72, pulse 73, temperature 98 F (36.7 C), temperature source Oral, resp. rate 16, height 5\' 7"  (1.702 m), weight 66.2 kg, SpO2 94%.  The patient is calm and cooperative at this time.  There have been no acute events since the last update.  Awaiting disposition plan from Social Work team.   Irean Hong, MD 05/14/23 [phone]

## 2023-05-14 NOTE — ED Notes (Signed)
 Patient belongings sent to the [facility] storage area.

## 2023-05-14 NOTE — ED Notes (Signed)
 Report off to [employer]

## 2023-05-14 NOTE — ED Provider Notes (Addendum)
 Patient was noted to have hypoxia overnight.  No acute complaints.  She is on oxygen and doing well.  Possible sleep apnea suspected.  Chest x-ray clear.  COVID, flu and RSV negative.  Will continue to monitor.   Faten Frieson, Layla Maw, DO 05/14/23 0545   Patient's oxygen saturation dropped quickly to 91% when taken off of oxygen while upright and awake.  Will repeat labs including troponin, D-dimer.  Last IM sedatives were given over 24 hours ago.   Minnie Shi, Layla Maw, DO 05/14/23 [phone]

## 2023-05-14 NOTE — ED Provider Notes (Signed)
-----------------------------------------   8:54 PM on 05/14/2023 ----------------------------------------- Patient is becoming increasingly agitated.  Patient walking out of her room, making verbal threats at times.  Nurse attempted to give the patient her nighttime medications and she states if she tries she is going to throw them.  Given the patient's agitation we will dose intramuscular Geodon for both patient and staff safety.   Minna Antis, MD 05/14/23 2055

## 2023-05-14 NOTE — ED Notes (Signed)
 Pt refused snack and vitals

## 2023-05-14 NOTE — TOC Progression Note (Signed)
 Transition of Care [facility]) - Progression Note    Patient Details  Name: Anne [name] MRN: [id number] Date of Birth: Sep 30, 1950  [facility]) CM/SW Contact  Elberta Fortis, RN Phone Number: 05/14/2023, 1:44 PM  Clinical Narrative:    Patient still in ED 22, required IM med's this morning for aggressive behaviors. Spoke with her assigned DSS worker Greggory Brandy, and they are applying patient for LTC benefits. TOC to continue to follow.         Expected Discharge Plan and Services                                               Social Determinants of Health (SDOH) Interventions SDOH Screenings   Food Insecurity: No Food Insecurity (04/24/2023)  Housing: Low Risk  (04/24/2023)  Transportation Needs: No Transportation Needs (04/24/2023)  Utilities: Not At Risk (04/24/2023)  Alcohol Screen: Low Risk  (02/22/2023)  Depression (PHQ2-9): High Risk (06/21/2022)  Financial Resource Strain: Low Risk  (02/22/2023)  Physical Activity: Inactive (02/22/2023)  Social Connections: Moderately Isolated (02/20/2023)  Stress: Patient Unable To Answer (02/22/2023)  Tobacco Use: Medium Risk (05/10/2023)  Health Literacy: Patient Unable To Answer (02/22/2023)    Readmission Risk Interventions     No data to display

## 2023-05-14 NOTE — ED Notes (Signed)
ivc/toc placement.Marland Kitchen
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
## 2023-05-15 ENCOUNTER — Encounter: Payer: Self-pay | Admitting: *Deleted

## 2023-05-15 MED ORDER — LORAZEPAM 2 MG PO TABS
2.0000 mg | ORAL_TABLET | Freq: Once | ORAL | Status: AC
  Administered 2023-05-15: 2 mg via ORAL
  Filled 2023-05-15: qty 1
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
## 2023-05-15 NOTE — ED Notes (Signed)
 Pt keeps coming out of their room and trying to enter other pt's rooms. Pt is looking for her children.

## 2023-05-15 NOTE — Patient Outreach (Signed)
 Patient's chart reviewed, patient currently in the ED at [facility] Per TOC note, she spoke with patient's  assigned DSS worker Greggory Brandy,  they are applying for long term care benefits for patient. CSW to continue to follow if patient returns home.  Graysen Depaula, LCSW [place]  [facility] Licensed Clinical Social Worker Care Coordinator  Direct Dial: [phone]

## 2023-05-15 NOTE — ED Notes (Signed)
 Pt keeps coming out of her room and trying to walk down the hall past the red line. Pt is looking for her children. Very non-redirectable.

## 2023-05-15 NOTE — ED Notes (Signed)
 Pt refusing vital sign check at this time

## 2023-05-15 NOTE — ED Notes (Signed)
 Patient sitting in the doorway yelling I need someone now! Nurse will talk with Patient.

## 2023-05-15 NOTE — ED Notes (Signed)
 Pt given dinner tray.

## 2023-05-15 NOTE — ED Notes (Signed)
 Pt constantly in the hallway and attempting to enter other pts room. When redirected pt gets agitated and raises her voice.

## 2023-05-15 NOTE — ED Notes (Signed)
 Pt attempting to leave the quad area again. Pt redirected by security back to pt's room.

## 2023-05-15 NOTE — ED Notes (Signed)
 Pt attempting to come out of quad area again. Pt redirected by security. Pt sitting in chair in her doorway.

## 2023-05-15 NOTE — ED Notes (Signed)
 Pt attempting to leave the quad area again. Pt redirected by security. Pt sitting in chair in doorway of her room

## 2023-05-15 NOTE — ED Notes (Signed)
 Pt coming out of room and asking about her mother. Pt informed that her mother is not currently here and asked to return to her room. Pt sitting in chair in the doorway of her room.

## 2023-05-15 NOTE — ED Notes (Signed)
 Pt in bed enjoying pm snack

## 2023-05-15 NOTE — ED Provider Notes (Signed)
-----------------------------------------   7:37 AM on 05/15/2023 -----------------------------------------   Blood pressure (!) 148/73, pulse 79, temperature 98 F (36.7 C), temperature source Oral, resp. rate 18, height 5\' 7"  (1.702 m), weight 66.2 kg, SpO2 95%.  The patient is calm and cooperative at this time.  There have been no acute events since the last update.  Awaiting disposition plan from [facility] team.   Janith Lima, MD 05/15/23 [phone]

## 2023-05-15 NOTE — ED Notes (Signed)
 Pt wandering into other pt's room. Not easily redirected. Raising her voice at staff saying, "This is bull shit."
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
## 2023-05-15 NOTE — ED Notes (Signed)
 Patient coming out of room, upset that she is not at work, states that she is leaving to go to work, she is trying to walk out, [facility] talked her into going back to room, and guard assisted, Nurse will talk to Doctor about something to help her with agitation and her being fixated on work.

## 2023-05-15 NOTE — ED Notes (Signed)
 Breakfast tray provided for pt.

## 2023-05-15 NOTE — ED Notes (Signed)
 Patient is trying to leave the hospital and insist on going to work, becoming angry and frustrated. Nurse will administer medication that Doctor ordered for agitation to prevent escalation.

## 2023-05-15 NOTE — ED Notes (Signed)
 Pt given graham crackers.

## 2023-05-15 NOTE — ED Notes (Signed)
 Patient up walking around looking for the bathroom, guard showed her the bathroom. Staff will continue to monitor.

## 2023-05-15 NOTE — ED Notes (Signed)
IVC  TOC  PLACEMENT 

## 2023-05-16 ENCOUNTER — Emergency Department

## 2023-05-16 LAB — CBC
HCT: 37.8 % (ref 36.0–46.0)
Hemoglobin: 12.2 g/dL (ref 12.0–15.0)
MCH: 29.2 pg (ref 26.0–34.0)
MCHC: 32.3 g/dL (ref 30.0–36.0)
MCV: 90.4 fL (ref 80.0–100.0)
Platelets: 123 10*3/uL — ABNORMAL LOW (ref 150–400)
RBC: 4.18 MIL/uL (ref 3.87–5.11)
RDW: 14.6 % (ref 11.5–15.5)
WBC: 5.4 10*3/uL (ref 4.0–10.5)
nRBC: 0 % (ref 0.0–0.2)

## 2023-05-16 LAB — BASIC METABOLIC PANEL WITH GFR
Anion gap: 6 (ref 5–15)
BUN: 24 mg/dL — ABNORMAL HIGH (ref 8–23)
CO2: 29 mmol/L (ref 22–32)
Calcium: 9.2 mg/dL (ref 8.9–10.3)
Chloride: 105 mmol/L (ref 98–111)
Creatinine, Ser: 0.77 mg/dL (ref 0.44–1.00)
GFR, Estimated: >60 mL/min (ref >60)
Glucose, Bld: 91 mg/dL (ref 70–99)
Potassium: 3.6 mmol/L (ref 3.5–5.1)
Sodium: 140 mmol/L (ref 135–145)

## 2023-05-16 MED ORDER — ACETAMINOPHEN 325 MG PO TABS
650.0000 mg | ORAL_TABLET | Freq: Once | ORAL | Status: AC
  Administered 2023-05-16: 650 mg via ORAL
  Filled 2023-05-16: qty 2

## 2023-05-16 NOTE — ED Notes (Addendum)
 Thud heard from pt room. This nurse and ED tech to pt room and pt found on floor laying next to the bed. Pt on her right side with her face / forehead resting on the ground. Pt alert upon initial evaluation but very weak when assisted to stand. Pt states she needs to use the restroom. 2 person assist to the restroom; pt almost fell several times while ambulating.

## 2023-05-16 NOTE — ED Notes (Signed)
Ivc /pending placement 
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
## 2023-05-16 NOTE — ED Notes (Signed)
 Pt pants noted to be too large so change of clothes provided by ED tech. Pt assisted with wiping off all over with hospital provided hygiene wipes and clothing changed. Pt remains weak but cooperative. Assisted back to bed. MD at the bedside for pt evaluation. Charge nurse aware of fall.

## 2023-05-16 NOTE — ED Provider Notes (Signed)
 Vitals:   05/15/23 1936 05/16/23 0800  BP: (!) 144/80 (!) 150/85  Pulse: 74 67  Resp: 18 17  Temp: 98.4 F (36.9 C) 98 F (36.7 C)  SpO2: 96% 95%   ----------------------------------------- 8:09 AM on 05/16/2023 -----------------------------------------  Patient noticed to be possibly fallen.  Was not witnessed.  She was noted to be alongside her bed, but she has ecchymosis across midface which appear to be old but then question if there could be a new small contusion over her left frontal forehead.  She moves all extremities well, nursing based her, there is no evidence of other injuries to noted that would be new.  She is resting comfortably has no complaint.  She is fully awake and alert.  She had a recent evaluation with labs and CT imaging performed about 2 days ago.  She does not appear in acute distress.  She has reportedly fallen this morning and seems to be a bit more fatigued than typical according to staff.  She is fully awake alert, but is confused as a history of said.  Will check CBC metabolic panel CT head and cervical spine.  Discussed with charge nurse, Adela Lank, and working on options for sitter.  Fall risk precautions instituted   Sharyn Creamer, MD 05/16/23 [phone]

## 2023-05-16 NOTE — TOC Progression Note (Signed)
 Transition of Care [facility]) - Progression Note    Patient Details  Name: Anne [name] MRN: [id number] Date of Birth: 1950/12/03  [facility]) CM/SW Contact  Colin Broach, LCSW Phone Number: 05/16/2023, 11:59 AM  Clinical Narrative:   CSW phoned Greggory Brandy, DSS [phone]) to see where they are in the process of getting LTC or patient.  LVM for a return phone call.         Expected Discharge Plan and Services                                               Social Determinants of Health (SDOH) Interventions SDOH Screenings   Food Insecurity: No Food Insecurity (04/24/2023)  Housing: Low Risk  (04/24/2023)  Transportation Needs: No Transportation Needs (04/24/2023)  Utilities: Not At Risk (04/24/2023)  Alcohol Screen: Low Risk  (02/22/2023)  Depression (PHQ2-9): High Risk (06/21/2022)  Financial Resource Strain: Low Risk  (02/22/2023)  Physical Activity: Inactive (02/22/2023)  Social Connections: Moderately Isolated (02/20/2023)  Stress: Patient Unable To Answer (02/22/2023)  Tobacco Use: Medium Risk (05/10/2023)  Health Literacy: Patient Unable To Answer (02/22/2023)    Readmission Risk Interventions     No data to display

## 2023-05-16 NOTE — ED Notes (Signed)
 Breakfast tray and beverage not provided until after CT results are provided.

## 2023-05-16 NOTE — ED Notes (Signed)
 Pt notified the sitter that she was having lower back pain, this RN in to assess pt and pt sits up by herself in the bed and rubs her right flank to show the location of her pain, this RN notified dr Fanny Bien

## 2023-05-16 NOTE — ED Notes (Signed)
 This tech and Environmental education officer prepared meal tray for pt. Pt eating dinner at this time.
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
## 2023-05-16 NOTE — ED Notes (Signed)
 This tech assisted pt to the bathroom. Pt was very unsteady on her feet and relied on this tech for balance. Pt urinated in the toilet. This tech assisted the pt back to the bed. Pt is resting in bed at this time. Sitter at bedside.

## 2023-05-16 NOTE — ED Notes (Signed)
 Pt returned from CT assisted back to bed. Fall mats placed on floor next to bed. Yellow socks placed on pt. Side rails up for pt safety and sitter by the bedside. Pt resting with eyes closed and even respirations. No distress noted.

## 2023-05-16 NOTE — ED Notes (Signed)
 Pt to CT with ct tech and security

## 2023-05-16 NOTE — ED Notes (Signed)
 PT wandered into empty room and urinated on herself and the bed. Pt changed, bed wiped down, pt redirected back to her room.
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
## 2023-05-17 ENCOUNTER — Encounter: Payer: Self-pay | Admitting: Oncology

## 2023-05-17 LAB — LEVETIRACETAM LEVEL: Levetiracetam Lvl: 39.9 ug/mL (ref 10.0–40.0)

## 2023-05-17 MED ORDER — HALOPERIDOL LACTATE 5 MG/ML IJ SOLN
2.0000 mg | Freq: Once | INTRAMUSCULAR | Status: AC
  Administered 2023-05-17: 2 mg via INTRAMUSCULAR
  Filled 2023-05-17: qty 1

## 2023-05-17 MED ORDER — ZIPRASIDONE MESYLATE 20 MG IM SOLR
10.0000 mg | Freq: Once | INTRAMUSCULAR | Status: AC
  Administered 2023-05-17: 10 mg via INTRAMUSCULAR
  Filled 2023-05-17: qty 20

## 2023-05-17 NOTE — ED Notes (Addendum)
 Pt's step-daughter Danielle visiting at this time, has come from [employer] [phone]. Will be here until 5pm. Pt requesting to speak to case worker, who is being messaged at this time.

## 2023-05-17 NOTE — TOC Progression Note (Signed)
 Transition of Care [facility]) - Progression Note    Patient Details  Name: Anne [name] MRN: [id number] Date of Birth: 09-May-1950  [facility]) CM/SW Contact  Elberta Fortis, RN Phone Number: 05/17/2023, 12:18 PM  Clinical Narrative:    Sherron Monday with DSS worker Greggory Brandy. He reached out to her daughter Stefan Church and said Marylene Land and other daughter [name] are applying for guardianship over [place]. Per Theodore Demark Ward from [facility] was applying her for LTC. Message sent to [facility] and waiting for a response.        Expected Discharge Plan and Services                                               Social Determinants of Health (SDOH) Interventions SDOH Screenings   Food Insecurity: No Food Insecurity (04/24/2023)  Housing: Low Risk  (04/24/2023)  Transportation Needs: No Transportation Needs (04/24/2023)  Utilities: Not At Risk (04/24/2023)  Alcohol Screen: Low Risk  (02/22/2023)  Depression (PHQ2-9): High Risk (06/21/2022)  Financial Resource Strain: Low Risk  (02/22/2023)  Physical Activity: Inactive (02/22/2023)  Social Connections: Moderately Isolated (02/20/2023)  Stress: Patient Unable To Answer (02/22/2023)  Tobacco Use: Medium Risk (05/10/2023)  Health Literacy: Patient Unable To Answer (02/22/2023)    Readmission Risk Interventions     No data to display

## 2023-05-17 NOTE — ED Notes (Signed)
 Pt falling asleep in chair. Pt refusing to get into bed so recliner was brought into pt's room for pt to sit in and sleep. Safety sitter remains in room with pt.

## 2023-05-17 NOTE — ED Notes (Signed)
Dinner tray and beverage provided 
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
## 2023-05-17 NOTE — ED Notes (Signed)
 Patient contiuously coming out of her room stating she needs to go home and call her mom and dad. This tech and quad officer eaisly redirect patient back to her room.

## 2023-05-17 NOTE — ED Notes (Signed)
 Breakfast tray given to pt. Attempted to wake pt without success.
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
## 2023-05-17 NOTE — ED Notes (Signed)
 Pt keeps coming out of room attempting to leave. Is redirected by the Emergency planning/management officer, this RN, and ED tech. Pt becomes less cooperative each time.
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
## 2023-05-18 MED ORDER — HALOPERIDOL LACTATE 5 MG/ML IJ SOLN
2.0000 mg | Freq: Once | INTRAMUSCULAR | Status: AC
  Administered 2023-05-18: 2 mg via INTRAMUSCULAR
  Filled 2023-05-18: qty 1

## 2023-05-18 NOTE — ED Notes (Signed)
 Suggested pt try to get some rest and turned the light out. Pt laid in the bed and complied with request.

## 2023-05-18 NOTE — ED Provider Notes (Signed)
 Emergency Medicine Observation Re-evaluation Note  Anne [name] is a 73 y.o. female, currently awaiting TOC placement  Physical Exam  BP 137/86   Pulse 70   Temp 97.8 F (36.6 C) (Oral)   Resp 17   Ht 5\' 7"  (1.702 m)   Wt 66.2 kg   SpO2 95%   BMI 22.87 kg/m  Physical Exam General: resting comfortably   ED Course / MDM  No lab work in past 24 hours. EKG was performed for unclear reasons earlier today, however did not show any concerning abnormalities.   Plan  Current plan is for social work disposition.    Marylynn Soho, MD 05/18/23 [phone]

## 2023-05-18 NOTE — ED Notes (Signed)
 Placed yellow nonslip socks on pt.

## 2023-05-18 NOTE — ED Notes (Signed)
Pt provided evening snack, 100% intake.

## 2023-05-18 NOTE — ED Notes (Signed)
 Hospital meal provided, pt tolerated w/o complaints.  Waste discarded appropriately.

## 2023-05-18 NOTE — ED Notes (Signed)
 Pt redirected back to room. TV turned on and pt sitting in recliner.

## 2023-05-18 NOTE — ED Notes (Signed)
 Pt ambulated to the bathroom.

## 2023-05-18 NOTE — ED Notes (Signed)
 Pt keeps coming out of room. Pt is redirected by [facility] and ED staff.

## 2023-05-18 NOTE — ED Notes (Signed)
 Per Dr.Goodman patient has been psych cleared did not renew IVC paper
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
## 2023-05-19 MED ORDER — HALOPERIDOL LACTATE 5 MG/ML IJ SOLN
2.0000 mg | Freq: Once | INTRAMUSCULAR | Status: AC
  Administered 2023-05-19: 2 mg via INTRAMUSCULAR
  Filled 2023-05-19: qty 1

## 2023-05-19 NOTE — ED Notes (Signed)
Pt provided snack at this time. 

## 2023-05-19 NOTE — ED Notes (Signed)
 Patient spoke with [name], daughter, on phone at this time

## 2023-05-19 NOTE — ED Notes (Signed)
 Patient pacing in quad hall, begging to go home.

## 2023-05-19 NOTE — ED Provider Notes (Signed)
-----------------------------------------   3:38 AM on 05/19/2023 -----------------------------------------   Blood pressure 127/83, pulse 85, temperature 98 F (36.7 C), temperature source Oral, resp. rate 16, height 1.702 m (5\' 7" ), weight 66.2 kg, SpO2 100%.  The patient is calm and cooperative at this time.  There have been no acute events since the last update.  Awaiting disposition plan from [facility] team.  Of note, the patient's IVC is running out, but psychiatry has not deemed it necessary to continue the IVC at this time and the patient is a Child psychotherapist placement issue   Lynnda Sas, MD 05/19/23 [phone]

## 2023-05-19 NOTE — ED Notes (Signed)
 vol/pending placement.Marland Kitchen
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
## 2023-05-19 NOTE — ED Notes (Signed)
 Pt provided with dinner tray. Tolerated w/o complaints, pt ate 100%.

## 2023-05-19 NOTE — ED Notes (Signed)
Pt assisted to bathroom without incident.

## 2023-05-20 MED ORDER — HALOPERIDOL LACTATE 5 MG/ML IJ SOLN
2.0000 mg | Freq: Once | INTRAMUSCULAR | Status: AC
  Administered 2023-05-20: 2 mg via INTRAMUSCULAR
  Filled 2023-05-20: qty 1

## 2023-05-20 NOTE — ED Notes (Addendum)
 Fall precautions in place for Pt. This RN placed fall band, fall grip socks, bed alarm and fall sign.

## 2023-05-20 NOTE — ED Notes (Signed)
Dinner meal provided for pt.

## 2023-05-20 NOTE — ED Notes (Signed)
Snack given at this time

## 2023-05-20 NOTE — ED Notes (Addendum)
 Pt daughter - [name] called for an update, states she's the only one applying for guardianship of Mrs. Nulty and Krystal will have nothing to do with it. Per DSS Pt is good to d/c so long as someone can come take her home and it's not going home with her husband [name] d/t safety concerns. This RN asked what was the plan for going home once she received guardianship, [name] told this RN she is still in the process of figuring that out and asked for a call from Case manager, Deeanna Farm, RN made aware.

## 2023-05-20 NOTE — ED Notes (Signed)
 Pt was Given Breakfast w/ juice. Pt up in Recliner
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
## 2023-05-20 NOTE — ED Notes (Signed)
 Pt's room lights turned off

## 2023-05-20 NOTE — ED Notes (Signed)
 Pt Resting comfortably in bed light off

## 2023-05-20 NOTE — ED Notes (Signed)
 Pt didn't receive a shower

## 2023-05-21 MED ORDER — HALOPERIDOL LACTATE 5 MG/ML IJ SOLN
2.0000 mg | Freq: Once | INTRAMUSCULAR | Status: AC
  Administered 2023-05-21: 2 mg via INTRAMUSCULAR
  Filled 2023-05-21: qty 1

## 2023-05-21 NOTE — ED Provider Notes (Signed)
-----------------------------------------   6:09 AM on 05/21/2023 -----------------------------------------   Blood pressure (!) 152/75, pulse 80, temperature 98 F (36.7 C), temperature source Oral, resp. rate 18, height 5\' 7"  (1.702 m), weight 66.2 kg, SpO2 92%.  The patient is calm and cooperative at this time.  There have been no acute events since the last update.  Awaiting disposition plan from Social Work team.   Tanny Harnack J, MD 05/21/23 [phone]
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
## 2023-05-21 NOTE — ED Notes (Signed)
 Pt spoke to daughter, [name], on the phone.  Pt was tearful when getting off the phone. Ms Zentz stated "I don't even feel like a real person anymore. I've been in here in this jail for months and nobody will come get me."  Ms Steedley continued to be tearful, staff allowed time for pt to verbalize thoughts, feelings, and fears.  Staff offered encouragement.  Pt was able to calm self. Cont to monitor as ordered

## 2023-05-21 NOTE — TOC Progression Note (Signed)
 Transition of Care [facility]) - Progression Note    Patient Details  Name: Anne [name] MRN: [id number] Date of Birth: 05-05-1950  Transition of Care [facility]) CM/SW Contact  Odilia Bennett, LCSW Phone Number: 05/21/2023, 9:17 AM  Clinical Narrative:  CSW called daughter [name] per [facility] supervisor request. She will talk to her sister and see when they can have a family meeting/call with [facility] supervisor to discuss plan.   Expected Discharge Plan and Services                                               Social Determinants of Health (SDOH) Interventions SDOH Screenings   Food Insecurity: No Food Insecurity (04/24/2023)  Housing: Low Risk  (04/24/2023)  Transportation Needs: No Transportation Needs (04/24/2023)  Utilities: Not At Risk (04/24/2023)  Alcohol Screen: Low Risk  (02/22/2023)  Depression (PHQ2-9): High Risk (06/21/2022)  Financial Resource Strain: Low Risk  (02/22/2023)  Physical Activity: Inactive (02/22/2023)  Social Connections: Moderately Isolated (02/20/2023)  Stress: Patient Unable To Answer (02/22/2023)  Tobacco Use: Medium Risk (05/10/2023)  Health Literacy: Patient Unable To Answer (02/22/2023)    Readmission Risk Interventions     No data to display
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
## 2023-05-22 MED ORDER — QUETIAPINE FUMARATE 25 MG PO TABS: 25.0000 mg | ORAL_TABLET | Freq: Three times a day (TID) | ORAL | Status: DC | PRN

## 2023-05-22 MED ORDER — HALOPERIDOL LACTATE 5 MG/ML IJ SOLN
2.0000 mg | Freq: Once | INTRAMUSCULAR | Status: AC
  Administered 2023-05-22: 2 mg via INTRAMUSCULAR
  Filled 2023-05-22: qty 1

## 2023-05-22 MED ORDER — QUETIAPINE FUMARATE 25 MG PO TABS: 50.0000 mg | ORAL_TABLET | Freq: Three times a day (TID) | ORAL | Status: DC | PRN

## 2023-05-22 MED ORDER — QUETIAPINE FUMARATE 25 MG PO TABS
25.0000 mg | ORAL_TABLET | Freq: Once | ORAL | Status: AC
  Administered 2023-05-22: 25 mg via ORAL
  Filled 2023-05-22: qty 1

## 2023-05-22 MED ORDER — QUETIAPINE FUMARATE 25 MG PO TABS
25.0000 mg | ORAL_TABLET | ORAL | Status: DC
  Administered 2023-05-23: 25 mg via ORAL
  Filled 2023-05-22 (×2): qty 1

## 2023-05-22 NOTE — ED Notes (Signed)
 Hospital meal provided, pt tolerated w/o complaints.  Waste discarded appropriately.
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
## 2023-05-22 NOTE — ED Notes (Signed)
 Vol/toc.Marland Kitchen
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
## 2023-05-22 NOTE — ED Notes (Signed)
 When this RN attempted to give pt PO Seroquel, pt took cup and threw pills on floor, stating, "I'm not taking nothing, call my husband!" MD made aware for IM medication to reduce agitation.
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
## 2023-05-22 NOTE — TOC Progression Note (Signed)
 Transition of Care [facility]) - Progression Note    Patient Details  Name: Anne [name] MRN: [id number] Date of Birth: 1950-04-11  Transition of Care [facility]) CM/SW Contact  Elmira Haddock, LCSW Phone Number: 05/22/2023, 4:11 PM  Clinical Narrative:   CSW met with patient, supervisor Catha Clink and patient experience advocate, Amy in the patient's room.  The team discussed the discharge plan.  TOC  to set up [facility] and the family will work on getting PCS when patient returns home.  They will have to research agencies since this is a service that has to be paid for out of pocket by the family.  CSW reached out to [facility] and spoke with Randel Buss who is accepting patient and will provide the following services:  PT/OT/RN/SW/Aide. CSW phoned Brigid Canada with DSS and informed him of the discharge plan and asked that, when he completes his assessment in the home that he provide additional resources if possible.         Expected Discharge Plan and Services                                               Social Determinants of Health (SDOH) Interventions SDOH Screenings   Food Insecurity: No Food Insecurity (04/24/2023)  Housing: Low Risk  (04/24/2023)  Transportation Needs: No Transportation Needs (04/24/2023)  Utilities: Not At Risk (04/24/2023)  Alcohol Screen: Low Risk  (02/22/2023)  Depression (PHQ2-9): High Risk (06/21/2022)  Financial Resource Strain: Low Risk  (02/22/2023)  Physical Activity: Inactive (02/22/2023)  Social Connections: Moderately Isolated (02/20/2023)  Stress: Patient Unable To Answer (02/22/2023)  Tobacco Use: Medium Risk (05/10/2023)  Health Literacy: Patient Unable To Answer (02/22/2023)    Readmission Risk Interventions     No data to display

## 2023-05-22 NOTE — ED Notes (Signed)
 Pt received snack and drink
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
## 2023-05-22 NOTE — TOC Progression Note (Signed)
 Transition of Care [facility]) - Progression Note    Patient Details  Name: Anne [name] MRN: [id number] Date of Birth: 1950-04-11  Transition of Care [facility]) CM/SW Contact  Joslyn Nim, RN Phone Number: 05/22/2023, 9:53 AM  Clinical Narrative:     CM called daughter, [name] but had to leave a message.        Expected Discharge Plan and Services                                               Social Determinants of Health (SDOH) Interventions SDOH Screenings   Food Insecurity: No Food Insecurity (04/24/2023)  Housing: Low Risk  (04/24/2023)  Transportation Needs: No Transportation Needs (04/24/2023)  Utilities: Not At Risk (04/24/2023)  Alcohol Screen: Low Risk  (02/22/2023)  Depression (PHQ2-9): High Risk (06/21/2022)  Financial Resource Strain: Low Risk  (02/22/2023)  Physical Activity: Inactive (02/22/2023)  Social Connections: Moderately Isolated (02/20/2023)  Stress: Patient Unable To Answer (02/22/2023)  Tobacco Use: Medium Risk (05/10/2023)  Health Literacy: Patient Unable To Answer (02/22/2023)    Readmission Risk Interventions     No data to display

## 2023-05-22 NOTE — ED Notes (Signed)
 Pt's husband onsite for visit

## 2023-05-22 NOTE — ED Notes (Signed)
 Spoke to daughter [name], ok for pt's husband to visit as the DC plan is to go home with husband with assistance of home health

## 2023-05-22 NOTE — ED Provider Notes (Signed)
 7:26 PM patient was agitated earlier today and did take some p.o. 25 of Seroquel however unfortunately she continued to escalate and a few hours later was agitated again.  Attempted to try to give her her nighttime medications early with 50th p.o. Seroquel but patient threw it on the ground.  We have reached out to psychiatry to see if they have other recommendations for medications that can be scheduled to help with her daily agitation episodes requiring IM medications.  However at this time we will give some IM Haldol to help with her agitation and to ensure her safety. Dr Jadapalle is going to review her meds and give recs    Lubertha Rush, MD 05/22/23 [phone]

## 2023-05-23 NOTE — ED Notes (Signed)
 Attempted to call spouse x5 at this time. Still no answer. Unable to leave voicemail.

## 2023-05-23 NOTE — ED Notes (Signed)
 Husband in Rm visiting Pt. Waiting for SW to come.
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
## 2023-05-23 NOTE — ED Notes (Signed)
 Attempted to call spouse for a ride. Unable to get ahold of spouse. Unable to leave a voicemail.

## 2023-05-23 NOTE — ED Notes (Signed)
 Attempted to call husband x2. Unable to reach pt and no voicemail is set up. Unable to leave a voicemail.

## 2023-05-23 NOTE — ED Provider Notes (Signed)
-----------------------------------------   5:49 AM on 05/23/2023 -----------------------------------------   Blood pressure 137/75, pulse 92, temperature 98.2 F (36.8 C), temperature source Oral, resp. rate 17, height 5\' 7"  (1.702 m), weight 66.2 kg, SpO2 92%.  The patient is calm and cooperative at this time.  There have been no acute events since the last update.  Awaiting disposition plan from Social Work team.   Sten Dematteo J, MD 05/23/23 [phone]

## 2023-05-23 NOTE — ED Provider Notes (Signed)
 Emergency department handoff note  Care of this patient was signed out to me at the end of the previous provider shift.  All pertinent patient information was conveyed and all questions were answered.  Patient pending social work evaluation for disposition.  They have determined patient is stable for discharge home with her husband at this time. The patient has been reexamined and is ready to be discharged.  All diagnostic results have been reviewed and discussed with the patient/family.  Care plan has been outlined and the patient/family understands all current diagnoses, results, and treatment plans.  There are no new complaints, changes, or physical findings at this time.  All questions have been addressed and answered.  Patient was instructed to, and agrees to follow-up with their primary care physician as well as return to the emergency department if any new or worsening symptoms develop.   Dashanti Burr K, MD 05/23/23 1025

## 2023-05-23 NOTE — ED Notes (Signed)
 Attempted to call pt spouse x6. No answer. Unable to leave a voicemail.

## 2023-05-23 NOTE — ED Notes (Signed)
Pt given meal tray and beverage at this time

## 2023-05-23 NOTE — ED Notes (Signed)
 Pt preped for D/C.  Pt belongings given to her. Pt changed into her personal clothes. Pts Husband on site to take her home. LCSW advised pt has been informed of all D/c and care instructions.

## 2023-05-23 NOTE — ED Notes (Signed)
 Attempted to call pt spouse x4. No answer., Unable to leave a voicemail.

## 2023-05-23 NOTE — ED Notes (Signed)
 Regarding D/C Called husband. 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 Called Daughter on list. No answer.
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
## 2023-05-23 NOTE — ED Notes (Signed)
 Attempted to call Pt spouse with no answer. This RN unable to leave a voicemail due to phone continuously ringing.

## 2023-05-23 NOTE — ED Notes (Signed)
 [facility] Daughter Emergency contact with no answer. Voicemail left.

## 2023-05-24 ENCOUNTER — Telehealth: Payer: Self-pay

## 2023-05-27 ENCOUNTER — Encounter: Payer: Self-pay | Admitting: Oncology

## 2023-05-29 ENCOUNTER — Emergency Department

## 2023-05-29 ENCOUNTER — Emergency Department
Admission: EM | Admit: 2023-05-29 | Discharge: 2023-06-05 | Disposition: A | Discharge status: Home / Self Care | Attending: Emergency Medicine | Admitting: Emergency Medicine

## 2023-05-29 DIAGNOSIS — Z85830 Personal history of malignant neoplasm of bone: Secondary | ICD-10-CM | POA: Insufficient documentation

## 2023-05-29 DIAGNOSIS — Y9389 Activity, other specified: Secondary | ICD-10-CM | POA: Insufficient documentation

## 2023-05-29 DIAGNOSIS — R4189 Other symptoms and signs involving cognitive functions and awareness: Secondary | ICD-10-CM | POA: Diagnosis not present

## 2023-05-29 DIAGNOSIS — Z85118 Personal history of other malignant neoplasm of bronchus and lung: Secondary | ICD-10-CM | POA: Insufficient documentation

## 2023-05-29 DIAGNOSIS — M7022 Olecranon bursitis, left elbow: Secondary | ICD-10-CM | POA: Insufficient documentation

## 2023-05-29 DIAGNOSIS — R413 Other amnesia: Secondary | ICD-10-CM | POA: Insufficient documentation

## 2023-05-29 DIAGNOSIS — F32A Depression, unspecified: Secondary | ICD-10-CM | POA: Diagnosis not present

## 2023-05-29 DIAGNOSIS — F03918 Unspecified dementia, unspecified severity, with other behavioral disturbance: Secondary | ICD-10-CM | POA: Diagnosis present

## 2023-05-29 DIAGNOSIS — R410 Disorientation, unspecified: Secondary | ICD-10-CM | POA: Diagnosis not present

## 2023-05-29 DIAGNOSIS — I10 Essential (primary) hypertension: Secondary | ICD-10-CM | POA: Diagnosis not present

## 2023-05-29 DIAGNOSIS — R45851 Suicidal ideations: Secondary | ICD-10-CM | POA: Insufficient documentation

## 2023-05-29 DIAGNOSIS — R0602 Shortness of breath: Secondary | ICD-10-CM | POA: Diagnosis not present

## 2023-05-29 DIAGNOSIS — W19XXXA Unspecified fall, initial encounter: Secondary | ICD-10-CM | POA: Diagnosis not present

## 2023-05-29 DIAGNOSIS — Z79899 Other long term (current) drug therapy: Secondary | ICD-10-CM | POA: Diagnosis not present

## 2023-05-29 DIAGNOSIS — R0902 Hypoxemia: Secondary | ICD-10-CM | POA: Diagnosis not present

## 2023-05-29 DIAGNOSIS — F01518 Vascular dementia, unspecified severity, with other behavioral disturbance: Secondary | ICD-10-CM | POA: Diagnosis not present

## 2023-05-29 DIAGNOSIS — F419 Anxiety disorder, unspecified: Secondary | ICD-10-CM | POA: Insufficient documentation

## 2023-05-29 DIAGNOSIS — F0390 Unspecified dementia without behavioral disturbance: Secondary | ICD-10-CM | POA: Insufficient documentation

## 2023-05-29 DIAGNOSIS — N3001 Acute cystitis with hematuria: Secondary | ICD-10-CM | POA: Diagnosis not present

## 2023-05-29 DIAGNOSIS — R059 Cough, unspecified: Secondary | ICD-10-CM | POA: Insufficient documentation

## 2023-05-29 LAB — URINE DRUG SCREEN, QUALITATIVE (ARMC ONLY)
Amphetamines, Ur Screen: NOT DETECTED
Barbiturates, Ur Screen: NOT DETECTED
Benzodiazepine, Ur Scrn: NOT DETECTED
Cannabinoid 50 Ng, Ur ~~LOC~~: NOT DETECTED
Cocaine Metabolite,Ur ~~LOC~~: NOT DETECTED
MDMA (Ecstasy)Ur Screen: NOT DETECTED
Methadone Scn, Ur: NOT DETECTED
Opiate, Ur Screen: NOT DETECTED
Phencyclidine (PCP) Ur S: NOT DETECTED
Tricyclic, Ur Screen: POSITIVE — AB

## 2023-05-29 LAB — COMPREHENSIVE METABOLIC PANEL WITH GFR
ALT: 16 U/L (ref 0–44)
AST: 25 U/L (ref 15–41)
Albumin: 3.8 g/dL (ref 3.5–5.0)
Alkaline Phosphatase: 72 U/L (ref 38–126)
Anion gap: 10 (ref 5–15)
BUN: 24 mg/dL — ABNORMAL HIGH (ref 8–23)
CO2: 26 mmol/L (ref 22–32)
Calcium: 9.6 mg/dL (ref 8.9–10.3)
Chloride: 107 mmol/L (ref 98–111)
Creatinine, Ser: 1 mg/dL (ref 0.44–1.00)
GFR, Estimated: 59 mL/min — ABNORMAL LOW (ref >60)
Glucose, Bld: 100 mg/dL — ABNORMAL HIGH (ref 70–99)
Potassium: 4 mmol/L (ref 3.5–5.1)
Sodium: 143 mmol/L (ref 135–145)
Total Bilirubin: 0.3 mg/dL (ref 0.0–1.2)
Total Protein: 7.1 g/dL (ref 6.5–8.1)

## 2023-05-29 LAB — URINALYSIS, ROUTINE W REFLEX MICROSCOPIC
Bilirubin Urine: NEGATIVE
Glucose, UA: NEGATIVE mg/dL
Hgb urine dipstick: NEGATIVE
Ketones, ur: NEGATIVE mg/dL
Nitrite: NEGATIVE
Protein, ur: NEGATIVE mg/dL
Specific Gravity, Urine: 1.03 (ref 1.005–1.030)
WBC, UA: >50 WBC/hpf (ref 0–5)
pH: 5 (ref 5.0–8.0)

## 2023-05-29 LAB — CBC
HCT: 41.5 % (ref 36.0–46.0)
Hemoglobin: 12.8 g/dL (ref 12.0–15.0)
MCH: 28.3 pg (ref 26.0–34.0)
MCHC: 30.8 g/dL (ref 30.0–36.0)
MCV: 91.8 fL (ref 80.0–100.0)
Platelets: 151 10*3/uL (ref 150–400)
RBC: 4.52 MIL/uL (ref 3.87–5.11)
RDW: 14.7 % (ref 11.5–15.5)
WBC: 4.8 10*3/uL (ref 4.0–10.5)
nRBC: 0 % (ref 0.0–0.2)

## 2023-05-29 LAB — SALICYLATE LEVEL: Salicylate Lvl: <7 mg/dL — ABNORMAL LOW (ref 7.0–30.0)

## 2023-05-29 LAB — ACETAMINOPHEN LEVEL: Acetaminophen (Tylenol), Serum: 17 ug/mL (ref 10–30)

## 2023-05-29 LAB — ETHANOL: Alcohol, Ethyl (B): <15 mg/dL (ref <15)

## 2023-05-29 MED ORDER — ZIPRASIDONE MESYLATE 20 MG IM SOLR
20.0000 mg | Freq: Once | INTRAMUSCULAR | Status: AC
  Administered 2023-05-29: 20 mg via INTRAMUSCULAR
  Filled 2023-05-29: qty 20

## 2023-05-29 MED ORDER — CEFTRIAXONE SODIUM 1 G IJ SOLR
1.0000 g | INTRAMUSCULAR | Status: AC
  Administered 2023-05-30 – 2023-05-31 (×2): 1 g via INTRAMUSCULAR
  Filled 2023-05-29 (×2): qty 10

## 2023-05-29 MED ORDER — CEFTRIAXONE SODIUM 1 G IJ SOLR
1.0000 g | Freq: Once | INTRAMUSCULAR | Status: AC
Start: 2023-05-29 — End: 2023-05-29
  Administered 2023-05-29: 1 g via INTRAMUSCULAR
  Filled 2023-05-29: qty 10

## 2023-05-29 MED ORDER — QUETIAPINE FUMARATE 25 MG PO TABS
50.0000 mg | ORAL_TABLET | Freq: Two times a day (BID) | ORAL | Status: DC | PRN
  Administered 2023-05-30 – 2023-06-04 (×7): 50 mg via ORAL
  Filled 2023-05-29 (×9): qty 2
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
## 2023-05-29 NOTE — ED Notes (Signed)
 This tech obtained vital signs on pt.

## 2023-05-29 NOTE — Telephone Encounter (Signed)
 error

## 2023-05-29 NOTE — ED Triage Notes (Signed)
 Pt presents to the ED POV from home with husband. Pt has had urinary frequency that started today. There is swelling noted to left elbow that started a few days ago. Denies injury. Pt has hx of severe dementia.  Pt has significant behavioral health hx and has been hospitalized many times. Pt states that she has had thoughts of killing herself. When asked about a plan patient stated "probably tobacco". Pt denies intention of carrying out this plan.

## 2023-05-29 NOTE — ED Notes (Signed)
 Patient to xray at this time

## 2023-05-29 NOTE — ED Notes (Signed)
 Patient given dinner tray at this time.

## 2023-05-29 NOTE — ED Notes (Signed)
 Pt dressed out with belongings in one bag and inventoried by this RN and Caitlyn, NT: 1 pair purple shoes 1 pair blue shorts 1 gray shirt 1 white underwear  Pt in triage middle. First nurse and charge RN aware.

## 2023-05-29 NOTE — ED Provider Notes (Signed)
 [facility] Provider Note    Event Date/Time   First MD Initiated Contact with Patient 05/29/23 1500     (approximate)   History   Elbow Injury, Urinary Frequency, and Suicidal   HPI Anne [name] is a 73 y.o. female with history of dementia with behavioral disturbances presenting today for elbow injury and SI.  Patient came from home with her husband after they noted increased urinary frequency today.  Also noted to have swelling to the left elbow that started a few days ago but no obvious injury that they are aware of.  While discussing with triage, she stated that she has had recent thoughts of killing herself but no clear plan.  On my discussion she denies any of this at this time.  No HI.  Denies any other acute symptoms today.  Chart review: Patient with frequent ED visits centered around dementia, behavioral disturbances, and other psychiatric evaluations.     Physical Exam   Triage Vital Signs: ED Triage Vitals  Encounter Vitals Group     BP 05/29/23 1425 137/80     Systolic BP Percentile --      Diastolic BP Percentile --      Pulse Rate 05/29/23 1425 79     Resp 05/29/23 1425 18     Temp 05/29/23 1425 (!) 97.5 F (36.4 C)     Temp Source 05/29/23 1425 Oral     SpO2 05/29/23 1425 95 %     Weight 05/29/23 1426 150 lb (68 kg)     Height 05/29/23 1426 5\' 7"  (1.702 m)     Head Circumference --      Peak Flow --      Pain Score 05/29/23 1426 0     Pain Loc --      Pain Education --      Exclude from Growth Chart --     Most recent vital signs: Vitals:   05/29/23 1425 05/29/23 1946  BP: 137/80 (!) 147/83  Pulse: 79 73  Resp: 18 18  Temp: (!) 97.5 F (36.4 C) 97.9 F (36.6 C)  SpO2: 95% 91%   I have reviewed the vital signs. General:  Awake, alert, no acute distress. Head:  Normocephalic, Atraumatic. EENT:  PERRL, EOMI, Oral mucosa pink and moist, Neck is supple. Cardiovascular: Regular rate, 2+ distal pulses. Respiratory:   Normal respiratory effort, symmetrical expansion, no distress.   Extremities:  Moving all four extremities through full ROM without pain.  Bursal swelling around the left elbow but no tenderness palpation anywhere or with range of motion. Neuro:  Alert and oriented.  Interacting appropriately.   Skin:  Warm, dry, no rash.   Psych: Appropriate affect.    ED Results / Procedures / Treatments   Labs (all labs ordered are listed, but only abnormal results are displayed) Labs Reviewed  COMPREHENSIVE METABOLIC PANEL WITH GFR - Abnormal; Notable for the following components:      Result Value   Glucose, Bld 100 (*)    BUN 24 (*)    GFR, Estimated 59 (*)    All other components within normal limits  SALICYLATE LEVEL - Abnormal; Notable for the following components:   Salicylate Lvl <7.0 (*)    All other components within normal limits  URINE DRUG SCREEN, QUALITATIVE ([facility] ONLY) - Abnormal; Notable for the following components:   Tricyclic, Ur Screen POSITIVE (*)    All other components within normal limits  URINALYSIS, ROUTINE W REFLEX MICROSCOPIC -  Abnormal; Notable for the following components:   Color, Urine YELLOW (*)    APPearance HAZY (*)    Leukocytes,Ua MODERATE (*)    Bacteria, UA MANY (*)    All other components within normal limits  ETHANOL  ACETAMINOPHEN  LEVEL  CBC     EKG    RADIOLOGY Independent interpreted x-ray with no evidence of fracture   PROCEDURES:  Critical Care performed: No  Procedures   MEDICATIONS ORDERED IN ED: Medications  QUEtiapine  (SEROQUEL ) tablet 50 mg (50 mg Oral Not Given 05/29/23 2247)  cefTRIAXone  (ROCEPHIN ) injection 1 g (1 g Intramuscular Given 05/29/23 1615)  ziprasidone  (GEODON ) injection 20 mg (20 mg Intramuscular Given 05/29/23 2250)     IMPRESSION / MDM / ASSESSMENT AND PLAN / ED COURSE  I reviewed the triage vital signs and the nursing notes.                              Differential diagnosis includes, but is not  limited to, UTI, radial head fracture, bursal swelling, SI  Patient's presentation is most consistent with acute presentation with potential threat to life or bodily function.  Patient is a 73 year old female presenting today for multiple complaints.  On 1 complaint she is having polyuria symptoms although UA and UA does appear indicative of a UTI.  She was given 1 g ceftriaxone  IM while awaiting psychiatric evaluation.  Also has some swelling around her left elbow but appears to be more in the bursal area.  X-ray showed no evidence of obvious fracture.  Separately, also endorsing SI with triage.  However she is denying this with me.  Will have psychiatry evaluate her further.  Otherwise medically clear.  Patient signed out to oncoming provider pending psychiatric assessment.  The patient has been placed in psychiatric observation due to the need to provide a safe environment for the patient while obtaining psychiatric consultation and evaluation, as well as ongoing medical and medication management to treat the patient's condition.  The patient has not been placed under full IVC at this time.     FINAL CLINICAL IMPRESSION(S) / ED DIAGNOSES   Final diagnoses:  Olecranon bursitis of left elbow  Acute cystitis with hematuria  Suicidal ideation     Rx / DC Orders   ED Discharge Orders     None        Note:  This document was prepared using Dragon voice recognition software and may include unintentional dictation errors.   Kandee Orion, MD 05/29/23 [phone]

## 2023-05-29 NOTE — ED Notes (Signed)
 Patient's bed sheet and blankets changed at this time.

## 2023-05-29 NOTE — ED Notes (Signed)
 VOL/  PENDING  CONSULT

## 2023-05-30 MED ORDER — HALOPERIDOL LACTATE 5 MG/ML IJ SOLN
5.0000 mg | Freq: Once | INTRAMUSCULAR | Status: AC
  Administered 2023-05-30: 5 mg via INTRAMUSCULAR
  Filled 2023-05-30: qty 1

## 2023-05-30 MED ORDER — LORAZEPAM 2 MG/ML IJ SOLN
2.0000 mg | Freq: Once | INTRAMUSCULAR | Status: AC
  Administered 2023-05-30: 2 mg via INTRAMUSCULAR
  Filled 2023-05-30: qty 1

## 2023-05-30 MED ORDER — ACETAMINOPHEN 500 MG PO TABS
1000.0000 mg | ORAL_TABLET | Freq: Once | ORAL | Status: AC
  Administered 2023-05-30: 1000 mg via ORAL
  Filled 2023-05-30: qty 2

## 2023-05-30 MED ORDER — ZIPRASIDONE MESYLATE 20 MG IM SOLR
20.0000 mg | Freq: Once | INTRAMUSCULAR | Status: AC
Start: 2023-05-30 — End: 2023-05-30
  Administered 2023-05-30: 20 mg via INTRAMUSCULAR
  Filled 2023-05-30: qty 20
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
## 2023-05-30 NOTE — ED Notes (Signed)
 Pt repeatedly out of bed looking into other patients rooms. Pt becoming increasingly agitated demanding to go home. This tech was able to redirect pt back into bed.Pt remains agitated but cooperative at this time.
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
## 2023-05-30 NOTE — BH Assessment (Signed)
 Comprehensive Clinical Assessment (CCA) Screening, Triage and Referral Note  05/30/2023 Anne [name] [id number]  Chief Complaint:  Chief Complaint  Patient presents with   Elbow Injury   Urinary Frequency   Suicidal   Visit Diagnosis: Dementia   Anne [name] is a 73 year old female who presents to the ER due to a fall. While in the ER, she voiced SI. Patient told TTS she may have said it but she's not certain. She currently denies SI and any plans of her hurting herself. She requesting to return home because she doesn't want to be in the ER. She was able to give her name and the month and date of her date of birth. However, she was unable to give the year she was born. She was also unable to give the current year, date and day of the week. When asked those things, she laughed and stated, "I don't know. I really don't keep up with that." And then she started laughing. Patient denies HI and AV/H.  Patient Reported Information How did you hear about us ? Other (Comment) (EMS)  What Is the Reason for Your Visit/Call Today? Patient brought to the ER due to a fall. While here she voiced SI.  How Long Has This Been Causing You Problems? 1 wk - 1 month  What Do You Feel Would Help You the Most Today? Treatment for Depression or other mood problem   Have You Recently Had Any Thoughts About Hurting Yourself? Yes  Are You Planning to Commit Suicide/Harm Yourself At This time? No   Have you Recently Had Thoughts About Hurting Someone Marigene Shoulder? No  Are You Planning to Harm Someone at This Time? No  Explanation: UTA   Have You Used Any Alcohol or Drugs in the Past 24 Hours? No  How Long Ago Did You Use Drugs or Alcohol? No data recorded What Did You Use and How Much? No data recorded  Do You Currently Have a Therapist/Psychiatrist? No  Name of Therapist/Psychiatrist: No data recorded  Have You Been Recently Discharged From Any Office Practice or Programs? No  Explanation of  Discharge From Practice/Program: Pt recently discharged from [facility] ED.    CCA Screening Triage Referral Assessment Type of Contact: Face-to-Face  Telemedicine Service Delivery:   Is this Initial or Reassessment?   Date Telepsych consult ordered in [facility]:    Time Telepsych consult ordered in [facility]:    Location of Assessment: [facility] ED  Provider Location: [facility] ED    Collateral Involvement: None provided   Does Patient Have a Court Appointed Legal Guardian? No data recorded Name and Contact of Legal Guardian: No data recorded If Minor and Not Living with Parent(s), Who has Custody? n/a  Is CPS involved or ever been involved? Never  Is APS involved or ever been involved? Never   Patient Determined To Be At Risk for Harm To Self or Others Based on Review of Patient Reported Information or Presenting Complaint? No  Method: No Plan  Availability of Means: No access or NA  Intent: Vague intent or NA  Notification Required: No need or identified person  Additional Information for Danger to Others Potential: Previous attempts  Additional Comments for Danger to Others Potential: Pt has been aggressive with ED staff and with her husband.  Are There Guns or Other Weapons in Your Home? No  Types of Guns/Weapons: UTA  Are These Weapons Safely Secured?  No  Who Could Verify You Are Able To Have These Secured: UTA  Do You Have any Outstanding Charges, Pending Court Dates, Parole/Probation? UTA  Contacted To Inform of Risk of Harm To Self or Others: Family/Significant Other:   Does Patient Present under Involuntary Commitment? No    County of Residence: [place]   Patient Currently Receiving the Following Services: Medication Management   Determination of Need: Emergent (2 hours)   Options For Referral: ED Referral   Disposition Recommendation per psychiatric provider: Pending Psych Consult  Bryce Captain MS, LCAS, [facility], [facility] Therapeutic  Triage Specialist 05/30/2023 11:30 AM

## 2023-05-30 NOTE — ED Notes (Signed)
Calvin at the bedside for pt evaluation  

## 2023-05-30 NOTE — ED Notes (Signed)
Patient given snack.  

## 2023-05-30 NOTE — ED Notes (Signed)
 Pt up from stretcher pacing in hallway towards EMS bay. Pt tearful and states "I'm going home. nobody cares about me besides my mama". Pt redirected by this nurse and ED tech. Pt becoming increasingly agitated and states she just needs to go for a walk. ED tech assisted with ambulating pt around nurses station and then pt encouraged to sit back on stretcher. Pt remains tearful but cooperative at this time.

## 2023-05-30 NOTE — ED Notes (Signed)
 Patient willingly let this RN give Geodon . RN explained to patient that the medication is being administered due to agitation. Medication administered and patient quietly sitting on bed at this time.

## 2023-05-30 NOTE — ED Notes (Signed)
Pt provided with lunch tray and beverage  

## 2023-05-30 NOTE — ED Notes (Signed)
 EDP Jenean Minus notified of pt RA saturation and pt on [place] at this time. Pt sleeping soundly with RR even and unlabored. Color WNL, cap refill <2 seconds.
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
## 2023-05-30 NOTE — ED Notes (Signed)
Pt given breakfast tray and beverage.  

## 2023-05-30 NOTE — ED Provider Notes (Signed)
 Emergency Medicine Observation Re-evaluation Note  JAHNAVI MURATORE is a 73 y.o. female, seen on rounds today.  Pt initially presented to the ED for complaints of Elbow Injury, Urinary Frequency, and Suicidal Currently, the patient is resting, voices no medical complaints.  Physical Exam  BP 109/61   Pulse 68   Temp 98.1 F (36.7 C) (Axillary)   Resp 14   Ht 5\' 7"  (1.702 m)   Wt 68 kg   SpO2 94%   BMI 23.49 kg/m  Physical Exam General: Resting in no acute distress Cardiac: No cyanosis Lungs: Equal rise and fall Psych: Not agitated  ED Course / MDM  EKG:   I have reviewed the labs performed to date as well as medications administered while in observation.  Recent changes in the last 24 hours include Hypoxia noted while patient was sleeping.  Placed on 2 L nasal cannula oxygen with immediate improvement in saturations.  Patient afebrile, not tachycardic nor tachypneic.  Will monitor and trial off oxygen when she is awake.  Plan  Current plan is for psychiatric disposition.    Yariah Selvey J, MD 05/30/23 [phone]

## 2023-05-30 NOTE — ED Notes (Signed)
 Patient agitated at this time, screaming at staff, banging on the doors, refusing to stay in the room. Patient attempted to be verbally de-escalated by multiple staff members. Patient not able to be re-directed at this time. Patient making verbal threats at staff. PRN orders in place.

## 2023-05-30 NOTE — ED Notes (Signed)
 Pt's husband at bedside.

## 2023-05-31 ENCOUNTER — Encounter: Payer: Self-pay | Admitting: Psychiatry

## 2023-05-31 DIAGNOSIS — R413 Other amnesia: Secondary | ICD-10-CM | POA: Insufficient documentation

## 2023-05-31 DIAGNOSIS — F32A Depression, unspecified: Secondary | ICD-10-CM

## 2023-05-31 DIAGNOSIS — R4189 Other symptoms and signs involving cognitive functions and awareness: Secondary | ICD-10-CM

## 2023-05-31 MED ORDER — MIDAZOLAM 5 MG/ML PEDIATRIC INJ FOR INTRANASAL/SUBLINGUAL USE
5.0000 mg | Freq: Once | INTRAMUSCULAR | Status: AC
  Administered 2023-05-31: 5 mg via NASAL
  Filled 2023-05-31: qty 2

## 2023-05-31 NOTE — ED Notes (Signed)
 Changed pt scrub bottoms as they were wet from dinner food.

## 2023-05-31 NOTE — ED Notes (Signed)
 This tech and Melanie, Charity fundraiser, went to the patient's room due to the patient banging on the back wall door. The patient kept banging on it and was talking as if talking to someone on the other side of the door. This tech and Prentice Brochure, Charity fundraiser, told the patient that the door wouldn't open. The patient calmed down and sat back down onto the recliner.

## 2023-05-31 NOTE — ED Notes (Signed)
 Pt given dinner tray at this time.

## 2023-05-31 NOTE — ED Provider Notes (Signed)
 Vitals:   05/30/23 0740 05/30/23 1939  BP: 122/82 (!) 140/104  Pulse: 65 83  Resp: 16 17  Temp: 97.6 F (36.4 C) 98.3 F (36.8 C)  SpO2: 96% 93%     Patient was very aggressive with staff at about 9 PM, currently resting comfortably without distress.  Stable hemodynamics.  No further events overnight  Remains on antibiotic therapy for UTI  Patient pending psychiatry consult   Iver Marker, MD 05/31/23 [phone]

## 2023-05-31 NOTE — ED Notes (Signed)
 Pt is consistently walking around in halls asking when she can leave and tech redirected pt as pt attempted to go in another pt room.

## 2023-05-31 NOTE — ED Notes (Signed)
 Patient resting in bed at this time with eyes closed. Equal chest rise and fall noted.

## 2023-05-31 NOTE — ED Notes (Addendum)
 This tech entered the patient's room and the patient was twirling the cord to the chair alarm in between her fingers. This tech asked the patient if I could have the cord. The patient kept pulling on the cord and eventually pulled the chair alarm pad out from under herself. This tech asked the patient if she would sit on the bed instead, but patient refused. Patient has nonskid socks on. Melanie, RN, made aware.

## 2023-05-31 NOTE — ED Notes (Signed)
vol/pending consult.. 

## 2023-05-31 NOTE — ED Notes (Signed)
 Patient is laying in bed, and bed alarm is on.

## 2023-05-31 NOTE — ED Notes (Signed)
 Hospital meal provided.  100% consumed, pt tolerated w/o complaints.  Waste discarded appropriately.

## 2023-05-31 NOTE — ED Notes (Signed)
 Pt consistently trying to go home, RN and Tech both explained she cannot go home until doctor informs that she can go home. Redirected pt as she tried to go into another Pt room.

## 2023-05-31 NOTE — ED Notes (Signed)
 Pt denied shower this AM

## 2023-05-31 NOTE — Consult Note (Signed)
 [facility] Psychiatric Consult Initial  Patient Name: .Anne [name]  MRN: [id number]  DOB: 1950/03/24  Consult Order details:  Orders (From admission, onward)     Start     Ordered   05/29/23 1544  IP CONSULT TO PSYCHIATRY       Ordering Provider: Kandee Orion, MD  Provider:  (Not yet assigned)  Question Answer Comment  Place call to: psych   Reason for Consult Admit   Diagnosis/Clinical Info for Consult: passive SI which is no longer present      05/29/23 1543   05/29/23 1544  CONSULT TO CALL ACT TEAM       Ordering Provider: Kandee Orion, MD  Provider:  (Not yet assigned)  Question:  Reason for Consult?  Answer:  Psych consult   05/29/23 1543             Mode of Visit: In person    Psychiatry Consult Evaluation  Service Date: May 31, 2023 LOS:  LOS: 0 days  Chief Complaint "I feel ok"  Primary Psychiatric Diagnoses  Memory Impairment 2.  Depression  Per TTS note:  Anne [name] is a 73 year old female who presents to the ER due to a fall. While in the ER, she voiced SI. Patient told TTS she may have said it but she's not certain. She currently denies SI and any plans of her hurting herself. She requesting to return home because she doesn't want to be in the ER. She was able to give her name and the month and date of her date of birth. However, she was unable to give the year she was born. She was also unable to give the current year, date and day of the week. When asked those things, she laughed and stated, "I don't know. I really don't keep up with that." And then she started laughing. Patient denies HI and AV/H. Per nursing: Patient is experiencing experiencing memory problems along with confusion. She has not been able to fully participate in assessment. It was found that patient has UTI and currently on medication. Patient was given medications as prescribed to calm her down as she presented with agitations and anxiety.   Face-to-face encounter: 73 year  old female in bed awake. She appears somewhat restless but pleasant on approach. Alert and partially oriented: patient is able to state where she is and her names. She is disoriented to time and situations. Patient  believes she lives with her mother and father. She believes her mother is the one who packs her medications and gives them to her everyday. She reports  that her husband Anne [name] lives with them as well. She  reports not having any kinds thought it is documented that patient has 2 daughters.   Patient denies SI and states "Why would I do that ?".  She reports that she eats and sleeps well.   Collateral information from patient's daughter Anne [name] [phone]:   Patient suffers from dementia and her memory is deteriorating. She currently lives with her husband Anne [name] and he would be the one having more updates about patient.   Anne [name] was contacted multiple times with no response. Nursing to continue reaching out.      Assessment  Anne [name] is a 73 y.o. female admitted: Presented to the [facility] 05/29/2023  2:55 PM after a fall at home. She carries the psychiatric diagnoses of depression and has a past medical history of  HTN, Hypothyroidism, Cirrhosis, and currently on cancer  treatment. She also presented with UTI and currently on medication.   Her current presentation of agitations, memory impairment  is most consistent with her hx neurocognitive disorder (dementia).   Current outpatient psychotropic medications include Quetiapine , Sertraline  and historically she has had a therapeutic response response to these medications. It is not reported whether patient  was  compliant with medications prior to admission  or not. On initial examination, patient is restless moderate confusion. Please see plan below for detailed recommendations.   Diagnoses:  Active Hospital problems: Active Problems:   Memory impairment    Plan   ## Psychiatric Medication Recommendations:  Continue  current medications  ## Medical Decision Making Capacity: Not specifically addressed in this encounter  ## Further Work-up:  -- Not at this moment  -- most recent EKG : NA -- Pertinent labwork reviewed earlier this admission includes: All labs reviewed   ## Disposition:-- based on presenting symptoms including memory impairment, confusion, safety issue (falls)  along with medical conditions, Memory care is recommended.   ## Behavioral / Environmental: - No specific recommendations at this time.     ## Safety and Observation Level:  - Based on my clinical evaluation, I estimate the patient to be at Low risk of self harm in the current setting. - At this time, we recommend  routine. This decision is based on my review of the chart including patient's history and current presentation, interview of the patient, mental status examination, and consideration of suicide risk including evaluating suicidal ideation, plan, intent, suicidal or self-harm behaviors, risk factors, and protective factors. This judgment is based on our ability to directly address suicide risk, implement suicide prevention strategies, and develop a safety plan while the patient is in the clinical setting. Please contact our team if there is a concern that risk level has changed.  CSSR Risk Category:C-SSRS RISK CATEGORY: Error: Q3, 4, or 5 should not be populated when Q2 is No  Suicide Risk Assessment: Patient has following modifiable risk factors for suicide: untreated depression, under treated depression , social isolation, and lack of access to outpatient mental health resources, active and chronic memory/cognitive problems, which we are addressing by stabilization and  recommendation for memory care. Patient has following non-modifiable or demographic risk factors for suicide: NA Patient has the following protective factors against suicide: no history of suicide attempts and no history of NSSIB  Thank you for this  consult request. Recommendations have been communicated to the primary team.  Per patient's health history and presenting symptoms, safety is an issue. We will recommend memory care  at this time.   Elston Halsted, NP       History of Present Illness  Relevant Aspects of Hospital ED Course:  Admitted on 05/29/2023.   Patient Report:  Patient is actively confused and currently/historically experiencing memory/cognitive impairment.   Psych ROS:  Depression: Hx reported Anxiety:  current Mania (lifetime and current): NA Psychosis: (lifetime and current): NA  Collateral information:  Contacted : patient's daughter Maida Sciara [phone] Review of Systems  Constitutional: Negative.   HENT: Negative.    Eyes: Negative.   Cardiovascular: Negative.   Gastrointestinal: Negative.   Genitourinary: Negative.   Musculoskeletal:  Positive for falls.  Skin: Negative.   Neurological: Negative.   Endo/Heme/Allergies: Negative.   Psychiatric/Behavioral:  Positive for memory loss. The patient is nervous/anxious.      Psychiatric and Social History  Psychiatric History:  Information collected from : Patient, daughter, nursing and chart review  Prev Dx/Sx: Dementia,  depression Current Psych Provider: Unknown Home Meds (current): Quetiapine , Sertraline  Previous Med Trials: NA Therapy: NA  Prior Psych Hospitalization: Unknown  Prior Self Harm: Not reported Prior Violence: Not reported  Family Psych History: NA Family Hx suicide: NA  Social History:  Developmental Hx: NA Educational Hx: NA Occupational Hx: NA Legal Hx: NA Living Situation: NA Spiritual Hx: NA Access to weapons/lethal means: NA   Substance History Alcohol: NA  Type of alcohol NA Last Drink NA Number of drinks per day NA History of alcohol withdrawal seizures NA History of DT's NA Tobacco: Former user Illicit drugs: NA Prescription drug abuse: NA Rehab hx: NA  Exam Findings  Physical Exam:  Vital  Signs:  Temp:  [98.2 F (36.8 C)-98.3 F (36.8 C)] 98.2 F (36.8 C) (04/25 0838) Pulse Rate:  [71-83] 71 (04/25 0838) Resp:  [17] 17 (04/25 0838) BP: (120-140)/(80-104) 120/80 (04/25 0838) SpO2:  [93 %-95 %] 95 % (04/25 0838) Blood pressure 120/80, pulse 71, temperature 98.2 F (36.8 C), temperature source Oral, resp. rate 17, height 5\' 7"  (1.702 m), weight 68 kg, SpO2 95%. Body mass index is 23.49 kg/m.  Physical Exam HENT:     Head: Normocephalic and atraumatic.     Right Ear: Tympanic membrane normal.     Left Ear: Tympanic membrane normal.     Nose: Nose normal.  Eyes:     Extraocular Movements: Extraocular movements intact.     Pupils: Pupils are equal, round, and reactive to light.  Cardiovascular:     Rate and Rhythm: Normal rate.     Pulses: Normal pulses.  Pulmonary:     Effort: Pulmonary effort is normal.  Musculoskeletal:        General: Normal range of motion.     Cervical back: Normal range of motion.  Neurological:     Mental Status: She is alert. She is disoriented.     Mental Status Exam: General Appearance: Disheveled  Orientation:  Other:  partial  Memory:  Immediate;   Fair Recent;   Poor Remote;   Poor  Concentration:  Concentration: Poor and Attention Span: Poor  Recall:  Poor  Attention  Poor  Eye Contact:  Fair  Speech:  pressured, illogical  Language:  Fair  Volume:  Normal  Mood: Anxious  Affect:  Depressed and anxious  Thought Process:  Irrelevant  Thought Content:  Illogical  Suicidal Thoughts:  No  Homicidal Thoughts:  No  Judgement:  Poor  Insight:  Lacking  Psychomotor Activity:  Restlessness  Akathisia:  NA  Fund of Knowledge:  Poor      Assets:  Communication Skills Desire for Improvement Housing Social Support  Cognition:  Impaired,  Moderate  ADL's:  Not assessed  AIMS (if indicated):   NA     Other History   These have been pulled in through the EMR, reviewed, and updated if appropriate.  Family History:  The  patient's family history includes Dementia in her mother; Heart disease in her father; Hypertension in her mother; Ovarian cancer in her sister; Ovarian cancer (age of onset: 71) in her mother; Seizures in her father; Stroke in her father.  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/06/2018   Metastatic bone cancer    Seizures (HCC)    Subdural hematoma (HCC)     Surgical History: Past Surgical History:  Procedure Laterality Date   APPENDECTOMY  1971   CHOLECYSTECTOMY  1971   COLONOSCOPY WITH PROPOFOL  N/A 11/15/2016   Procedure: COLONOSCOPY WITH PROPOFOL ;  Surgeon: Luke Salaam, MD;  Location: [facility] ENDOSCOPY;  Service: Gastroenterology;  Laterality: N/A;   ENDOBRONCHIAL ULTRASOUND Right 07/30/2018   Procedure: ENDOBRONCHIAL ULTRASOUND;  Surgeon: Enos Harts, MD;  Location: [facility] ORS;  Service: Pulmonary;  Laterality: Right;   ESOPHAGOGASTRODUODENOSCOPY (EGD) WITH PROPOFOL  N/A 01/07/2017   Procedure: ESOPHAGOGASTRODUODENOSCOPY (EGD) WITH PROPOFOL ;  Surgeon: Luke Salaam, MD;  Location: [facility] ENDOSCOPY;  Service: Gastroenterology;  Laterality: N/A;   LAPAROSCOPY N/A 03/01/2017   Procedure: LAPAROSCOPY DIAGNOSTIC;  Surgeon: Marshall Skeeter, MD;  Location: [facility] ORS;  Service: General;  Laterality: N/A;   PORTA CATH INSERTION N/A 08/14/2018   Procedure: PORTA CATH INSERTION;  Surgeon: Celso College, MD;  Location: [facility] INVASIVE CV LAB;  Service: Cardiovascular;  Laterality: N/A;   TONSILECTOMY, ADENOIDECTOMY, BILATERAL MYRINGOTOMY AND TUBES  1955   TONSILLECTOMY     VENTRAL HERNIA REPAIR N/A 03/01/2017   10 x 14 CM Ventralight ST mesh, intraperitoneal location.    VENTRAL HERNIA REPAIR N/A 03/01/2017   Procedure: HERNIA REPAIR VENTRAL  ADULT;  Surgeon: Marshall Skeeter, MD;  Location: [facility] ORS;  Service: General;  Laterality: N/A;     Medications:   Current Facility-Administered Medications:    QUEtiapine  (SEROQUEL ) tablet 50 mg, 50 mg, Oral, BID PRN, Kandee Orion, MD, 50 mg at 05/30/23 6578  Current Outpatient Medications:    acetaminophen  (TYLENOL ) 500 MG tablet, Take 1,000 mg by mouth every 6 (six) hours as needed for moderate pain (pain score 4-6)., Disp: , Rfl:    levETIRAcetam  (KEPPRA ) 1000 MG tablet, Take 1 tablet (1,000 mg total) by mouth 2 (two) times daily., Disp: 180 tablet, Rfl: 3   levothyroxine  (SYNTHROID ) 112 MCG tablet, Take 1 tablet (112 mcg total) by mouth daily at 6 (six) AM., Disp: 30 tablet, Rfl: 0   memantine  (NAMENDA ) 10 MG tablet, Take 1 tablet (10 mg total) by mouth 2 (two) times daily., Disp: 60 tablet, Rfl: 2   Midazolam  (NAYZILAM ) 5 MG/0.1ML SOLN, Place 5 mg into the nose as needed., Disp: 2 each, Rfl: 2   Multiple Vitamin (MULTIVITAMIN) tablet, Take 1 tablet by mouth daily., Disp: , Rfl:    QUEtiapine  (SEROQUEL ) 50 MG tablet, Take 1 tablet (50 mg total) by mouth at bedtime., Disp: 30 tablet, Rfl: 2   rivastigmine  (EXELON ) 3 MG capsule, Take 1 capsule (3 mg total) by mouth 2 (two) times daily., Disp: 60 capsule, Rfl: 11   rosuvastatin  (CRESTOR ) 20 MG tablet, Take 1 tablet (20 mg total) by mouth daily., Disp: 90 tablet, Rfl: 3   sertraline  (ZOLOFT ) 100 MG tablet, TAKE 1 & 1/2 (ONE & ONE-HALF) TABLETS BY MOUTH ONCE DAILY (Patient taking differently: Take 100 mg by mouth daily. Along with 50mg  tablet total dose 150mg ), Disp: 135 tablet, Rfl: 0   sertraline  (ZOLOFT ) 50 MG tablet, Take 3 tablets (150 mg total) by mouth daily. (Patient taking differently: Take 50 mg by mouth daily. Along with 100mg  tablet total dose 150mg ), Disp: 30 tablet, Rfl: 2   [Paused] amLODipine  (NORVASC ) 5 MG tablet, Take 1 tablet by mouth once daily (Patient not taking: Reported on 03/15/2023), Disp: 90 tablet, Rfl: 0    lidocaine  (LIDODERM ) 5 %, Place 1 patch  onto the skin every 12 (twelve) hours. Remove & Discard patch within 12 hours or as directed by MD (Patient not taking: Reported on 05/07/2023), Disp: 10 patch, Rfl: 0  Facility-Administered Medications Ordered in Other Encounters:    denosumab  (XGEVA ) injection 120 mg, 120 mg, Subcutaneous, Q30 days, Avonne Boettcher, MD, 120 mg at 03/05/19 1525   heparin  lock flush 100 UNIT/ML injection, , , ,   Allergies: Allergies  Allergen Reactions   Bee Venom Swelling    Elston Halsted, NP

## 2023-05-31 NOTE — ED Notes (Addendum)
 Pt ambulated to bathroom to perform ADL's no assistance required

## 2023-05-31 NOTE — ED Notes (Addendum)
 Pt is sitting on recliner in room. Chair alarm is on.

## 2023-05-31 NOTE — ED Notes (Signed)
 Hospital meal provided, pt tolerated w/o complaints.  Waste discarded appropriately.

## 2023-06-01 MED ORDER — LEVETIRACETAM 500 MG PO TABS
1000.0000 mg | ORAL_TABLET | Freq: Two times a day (BID) | ORAL | Status: DC
  Administered 2023-06-01 – 2023-06-05 (×9): 1000 mg via ORAL
  Filled 2023-06-01 (×10): qty 2

## 2023-06-01 MED ORDER — RIVASTIGMINE TARTRATE 3 MG PO CAPS
3.0000 mg | ORAL_CAPSULE | Freq: Two times a day (BID) | ORAL | Status: DC
  Administered 2023-06-01 – 2023-06-05 (×9): 3 mg via ORAL
  Filled 2023-06-01 (×11): qty 1

## 2023-06-01 MED ORDER — LEVOTHYROXINE SODIUM 112 MCG PO TABS
112.0000 ug | ORAL_TABLET | Freq: Every day | ORAL | Status: DC
  Administered 2023-06-01 – 2023-06-05 (×5): 112 ug via ORAL
  Filled 2023-06-01 (×6): qty 1

## 2023-06-01 MED ORDER — MEMANTINE HCL 5 MG PO TABS
10.0000 mg | ORAL_TABLET | Freq: Two times a day (BID) | ORAL | Status: DC
  Administered 2023-06-01 – 2023-06-05 (×9): 10 mg via ORAL
  Filled 2023-06-01 (×10): qty 2

## 2023-06-01 MED ORDER — ROSUVASTATIN CALCIUM 20 MG PO TABS
20.0000 mg | ORAL_TABLET | Freq: Every day | ORAL | Status: DC
  Administered 2023-06-01 – 2023-06-05 (×6): 20 mg via ORAL
  Filled 2023-06-01 (×6): qty 1

## 2023-06-01 MED ORDER — SERTRALINE HCL 50 MG PO TABS
100.0000 mg | ORAL_TABLET | Freq: Every day | ORAL | Status: DC
  Administered 2023-06-01 – 2023-06-05 (×5): 100 mg via ORAL
  Filled 2023-06-01 (×5): qty 2

## 2023-06-01 MED ORDER — DIPHENHYDRAMINE HCL 50 MG/ML IJ SOLN
50.0000 mg | Freq: Once | INTRAMUSCULAR | Status: AC
  Administered 2023-06-01: 50 mg via INTRAMUSCULAR
  Filled 2023-06-01: qty 1

## 2023-06-01 MED ORDER — FOSFOMYCIN TROMETHAMINE 3 G PO PACK
3.0000 g | PACK | Freq: Once | ORAL | Status: AC
  Administered 2023-06-02: 3 g via ORAL
  Filled 2023-06-01: qty 3

## 2023-06-01 MED ORDER — OLANZAPINE 10 MG IM SOLR
5.0000 mg | Freq: Once | INTRAMUSCULAR | Status: AC
  Administered 2023-06-01: 5 mg via INTRAMUSCULAR
  Filled 2023-06-01: qty 10

## 2023-06-01 MED ORDER — LORAZEPAM 2 MG/ML IJ SOLN
2.0000 mg | Freq: Once | INTRAMUSCULAR | Status: AC
  Administered 2023-06-01: 2 mg via INTRAMUSCULAR
  Filled 2023-06-01: qty 1

## 2023-06-01 NOTE — ED Notes (Signed)
 Dinner tray placed on bedside table, pt currently asleep.

## 2023-06-01 NOTE — ED Notes (Signed)
 Pt standing with [employer] talking, pt asked to return to room. Pt sat in chair, no needs at this time.

## 2023-06-01 NOTE — ED Notes (Signed)
 Pt up to use restroom, steady gait, no assistance required at this time. Pt needing redirection back to room, attempting to enter another room.

## 2023-06-01 NOTE — ED Notes (Signed)
 Pt calling out for help, pt is wet and was trying to get out of bed. Pt placed back in bed to change. New sheets placed, new gown, chuck pad, brief, underwear given. Pt placed in recliner to eat dinner per request. Pt informed to call if needing to get up.

## 2023-06-01 NOTE — ED Notes (Signed)
 Vol/ rec memory care; TOC pending placement

## 2023-06-01 NOTE — ED Notes (Signed)
 Pt out of room, sitting on bed with pt in [facility]. This RN and Theatre manager got pt up and moved back to her room. Pt teary eyed stating one of her family members has cancer. Spoke with pt until pt feeling better.
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
## 2023-06-01 NOTE — ED Notes (Signed)
 Noticed pt was not given seroquel  earlier, tablets noted to be on desk. Had some miscommunication with Student RN. Pills given at this time.

## 2023-06-01 NOTE — ED Notes (Signed)
 Pt husband here to visit, husband stating he was expecting help at home with pt upon her last DC but never heard anything and nobody showed up that week, and that her dementia has gotten worse to the point he cannot control her at home. Has asked for someone to reach back out to contact him on plan. He also stated he is working on getting house liquidated to move her to SNF.

## 2023-06-01 NOTE — ED Notes (Signed)
 Pt given IM shot, pt took it willingly sitting in recliner.

## 2023-06-01 NOTE — ED Provider Notes (Signed)
 Emergency Medicine Observation Re-evaluation Note  Physical Exam   BP 120/80 (BP Location: Left Arm)   Pulse 71   Temp 98.2 F (36.8 C) (Oral)   Resp 17   Ht 5\' 7"  (1.702 m)   Wt 68 kg   SpO2 95%   BMI 23.49 kg/m   Patient appears in no acute distress.  ED Course / MDM   Getting agitated and unable to verbally redirect so ordered some Zyprexa  IM.  Good effect.  We ordered home meds.  Pt is awaiting dispo from consultants   Buell Carmin MD    Buell Carmin, MD 06/01/23 0200

## 2023-06-01 NOTE — ED Notes (Signed)
 Pt given IM shot, taken willingly while sitting in recliner.
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
## 2023-06-01 NOTE — ED Notes (Signed)
 Pt coming in and out of her room, pt reminded to stay in her room. Pt returned to room without issue.

## 2023-06-01 NOTE — ED Notes (Signed)
 Fall precautions in place for Pt. This RN placed fall band, fall grip socks, bed alarm and fall sign, however Pt agitated with fall precautions and took socks off.

## 2023-06-01 NOTE — TOC CM/SW Note (Signed)
 Received consult for placement to memory care unit.  Call placed to daughter Velia Gess, no answer, voice message left.   Holland Lundborg, RN, MSN, CCM Transition of Care RNCM
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
## 2023-06-01 NOTE — ED Notes (Signed)
 Pt Breakfast was provided at bedside

## 2023-06-01 NOTE — ED Notes (Addendum)
 This RN reached out to [employer] and Moishe Angel about who to contact for husband to speak to about getting help at home/next steps.

## 2023-06-01 NOTE — ED Notes (Signed)
 Pt continuously coming out of room to speak with [employer], pt has been taken back to room multiple times, will sit in chair for a couple minutes before coming back out again. Pt is also starting to wander into other rooms at this time.

## 2023-06-01 NOTE — ED Notes (Signed)
 Pt still coming out of room, yelling at staff about leaving and making threats, IM shot to be given for safety.

## 2023-06-01 NOTE — ED Notes (Addendum)
 Cry heard from pt's room, upon entering room pt is found next to chair sitting on floor. Pt stating needing to use restroom and tried to get up from chair and falling on buttocks. Pt initially complaining of some pain to buttocks, does not appear as if pt hit head anywhere. Pt examined by this RN and then stood up with Theatre manager and EDT Lamond Pilot. Pt put back in bed and placed on bedpan. Pt was able to void, she was then cleaned and changed into new brief, chuck pad, scrub pants. Warm blankets provided, Dr Peggi Bowels made aware. Pt not complaining of pain at this time.

## 2023-06-01 NOTE — ED Notes (Signed)
 Daughter Marie Shone called and updated, pt spoke with daughter for a few minutes. Pt breakfast tray finished and removed from room.

## 2023-06-01 NOTE — ED Notes (Signed)
 Pt noted to be taking off clothes while sitting in chair. Pants are ripped, new scrubs provided and placed on pt. Pt placed in bed and covered with warm blanket.

## 2023-06-01 NOTE — ED Provider Notes (Signed)
 7:43 PM Pt got 50 IM benadryl  around 130PM.  I was told by the nurse that he was walking by the room and saw her laying down in bed and then he continued to walk by the room and then heard patient scream and she had fallen onto her buttock when she states she needed to use the restroom.  He states that it was only a few seconds so he was adamant that she did not hit her head.  He examined the patient and she was able to void and had no bruising and was able to stand up.  About a hour and a half later I reviewed evaluated patient.  She is got no trauma noted to the head no hematoma.  She is sleepy but states that she is too tired does not want to open her eyes.  I am able to range her legs without any obvious injury.  At this time it seems unlikely for any trauma but will need to continue to closely monitor.  Per nursing staff that she has been pretty sleepy since the Benadryl .  Will let the oncoming team know to reevaluate her when she wakes up more to ensure no other injuries but per nursing staff she has not complained of anything.   Lubertha Rush, MD 06/01/23 [phone]

## 2023-06-01 NOTE — ED Notes (Signed)
 Pt still agitated, even during husbands visit, pt still coming out of room trying to leave. Pt still not following directions and still making threats to staff. Dr Felipe Horton to be made aware.

## 2023-06-02 ENCOUNTER — Emergency Department

## 2023-06-02 DIAGNOSIS — R0902 Hypoxemia: Secondary | ICD-10-CM

## 2023-06-02 DIAGNOSIS — F03918 Unspecified dementia, unspecified severity, with other behavioral disturbance: Secondary | ICD-10-CM | POA: Diagnosis not present

## 2023-06-02 LAB — URINALYSIS, W/ REFLEX TO CULTURE (INFECTION SUSPECTED)
Bilirubin Urine: NEGATIVE
Glucose, UA: NEGATIVE mg/dL
Hgb urine dipstick: NEGATIVE
Ketones, ur: NEGATIVE mg/dL
Nitrite: NEGATIVE
Protein, ur: NEGATIVE mg/dL
Specific Gravity, Urine: >1.046 — ABNORMAL HIGH (ref 1.005–1.030)
pH: 6 (ref 5.0–8.0)

## 2023-06-02 LAB — COMPREHENSIVE METABOLIC PANEL WITH GFR
ALT: 23 U/L (ref 0–44)
AST: 41 U/L (ref 15–41)
Albumin: 3.7 g/dL (ref 3.5–5.0)
Alkaline Phosphatase: 71 U/L (ref 38–126)
Anion gap: 8 (ref 5–15)
BUN: 23 mg/dL (ref 8–23)
CO2: 27 mmol/L (ref 22–32)
Calcium: 8.9 mg/dL (ref 8.9–10.3)
Chloride: 104 mmol/L (ref 98–111)
Creatinine, Ser: 0.97 mg/dL (ref 0.44–1.00)
GFR, Estimated: >60 mL/min
Glucose, Bld: 85 mg/dL (ref 70–99)
Potassium: 3.8 mmol/L (ref 3.5–5.1)
Sodium: 139 mmol/L (ref 135–145)
Total Bilirubin: 0.5 mg/dL (ref 0.0–1.2)
Total Protein: 6.7 g/dL (ref 6.5–8.1)

## 2023-06-02 LAB — CBC WITH DIFFERENTIAL/PLATELET
Abs Immature Granulocytes: 0.01 10*3/uL (ref 0.00–0.07)
Basophils Absolute: 0 10*3/uL (ref 0.0–0.1)
Basophils Relative: 1 %
Eosinophils Absolute: 0.1 10*3/uL (ref 0.0–0.5)
Eosinophils Relative: 3 %
HCT: 41.8 % (ref 36.0–46.0)
Hemoglobin: 13.3 g/dL (ref 12.0–15.0)
Immature Granulocytes: 0 %
Lymphocytes Relative: 23 %
Lymphs Abs: 1.2 10*3/uL (ref 0.7–4.0)
MCH: 29.1 pg (ref 26.0–34.0)
MCHC: 31.8 g/dL (ref 30.0–36.0)
MCV: 91.5 fL (ref 80.0–100.0)
Monocytes Absolute: 0.4 10*3/uL (ref 0.1–1.0)
Monocytes Relative: 7 %
Neutro Abs: 3.3 10*3/uL (ref 1.7–7.7)
Neutrophils Relative %: 66 %
Platelets: 129 10*3/uL — ABNORMAL LOW (ref 150–400)
RBC: 4.57 MIL/uL (ref 3.87–5.11)
RDW: 15 % (ref 11.5–15.5)
WBC: 5 10*3/uL (ref 4.0–10.5)
nRBC: 0 % (ref 0.0–0.2)

## 2023-06-02 LAB — RESP PANEL BY RT-PCR (RSV, FLU A&B, COVID)  RVPGX2
Influenza A by PCR: NEGATIVE
Influenza B by PCR: NEGATIVE
Resp Syncytial Virus by PCR: NEGATIVE
SARS Coronavirus 2 by RT PCR: NEGATIVE

## 2023-06-02 LAB — BRAIN NATRIURETIC PEPTIDE: B Natriuretic Peptide: 18 pg/mL (ref 0.0–100.0)

## 2023-06-02 LAB — TROPONIN I (HIGH SENSITIVITY)
Troponin I (High Sensitivity): 4 ng/L
Troponin I (High Sensitivity): 4 ng/L (ref <18)

## 2023-06-02 MED ORDER — IOHEXOL 350 MG/ML SOLN
75.0000 mL | Freq: Once | INTRAVENOUS | Status: AC | PRN
  Administered 2023-06-02: 75 mL via INTRAVENOUS

## 2023-06-02 MED ORDER — HALOPERIDOL LACTATE 5 MG/ML IJ SOLN: 2.0000 mg | Freq: Once | INTRAMUSCULAR | Status: DC

## 2023-06-02 NOTE — ED Notes (Signed)
 Pt provided with lunch tray.

## 2023-06-02 NOTE — ED Notes (Signed)
 VOL/ TOC

## 2023-06-02 NOTE — ED Notes (Signed)
 Pt given snack and beverage.

## 2023-06-02 NOTE — ED Notes (Signed)
 Pt placed in clean yellow socks. Pt tray set up for breakfast

## 2023-06-02 NOTE — ED Notes (Signed)
 Pt eating dinner and calm at this time. Pt provided with meds and took them.

## 2023-06-02 NOTE — ED Notes (Addendum)
 Pt placed on 2L [place] and O2 now at 92%. Pt O2 at 86% RA. Pt instructed to take in deep breath and no improvement with O2. MD informed.
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
## 2023-06-02 NOTE — Assessment & Plan Note (Signed)
 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  - TOC working on placement - Continue home regimen

## 2023-06-02 NOTE — ED Notes (Signed)
 Pt continuing to come out of room and try to leave. Pt becoming agitated . Pt provided with phone to try and call husband, pt very confused and threw the phone. Security at bedside

## 2023-06-02 NOTE — ED Notes (Signed)
 This tech obtained vital signs on pt.

## 2023-06-02 NOTE — ED Notes (Signed)
 Charge nurse spoke to patient about intolerance of aggressive behaviors. Patient apologetic at this time. Patient re-entered room and sat down in chair. This RN will hold Haldol  order unless needed at a future time.

## 2023-06-02 NOTE — ED Provider Notes (Addendum)
 Emergency Medicine Observation Re-evaluation Note  Physical Exam   BP (!) 140/73 (BP Location: Left Arm)   Pulse 83   Temp 98.4 F (36.9 C) (Oral)   Resp 18   Ht 5\' 7"  (1.702 m)   Wt 68 kg   SpO2 94%   BMI 23.49 kg/m   Patient appears in no acute distress.  ED Course / MDM    She is still sleeping quite soundly and answering my questions.  After her fall onto the buttocks I examined her head to toe and there is no obvious head trauma but she is quite somnolent still.  She did get Benadryl .  I am able to range at both hips without eliciting much pain.  There is no obvious deformities to the bilateral upper or lower extremities, chest wall injury or tenderness, abdomen is benign as is the back.  I will get an x-ray of the pelvis ct head neck.  Imaging completed and reviewed, all negative fortunately.  Patient resting comfortably.   Buell Carmin, MD 06/02/23 1610    Buell Carmin, MD 06/02/23 [id number]    Buell Carmin, MD 06/02/23 [phone]

## 2023-06-02 NOTE — ED Notes (Signed)
 Pt taken to CT.

## 2023-06-02 NOTE — ED Notes (Signed)
 Pt assisted to the bathroom by this tech. Pt urinated in toilet. Pt was assisted back to bed. Pt in bed at this time. No other needs verbalized at this time.

## 2023-06-02 NOTE — Assessment & Plan Note (Signed)
 Patient had a period of transient hypoxia this morning as low as 87%.  Workup including BMP, troponin, chest x-ray, and CTA all without acute abnormalities.  Patient has been in the ER for 3 days with minimal movement and during this time has received multiple doses of IM/IV sedation due to severe agitation, including last night.  In light of this, suspect hypoventilation and atelectasis are likely culprits of transient hypoxia.  On examination, pulse ox maintained between 97 to 100% on room air.  At this time, no indication for hospital admission or further evaluation of hypoxia.  - Discussed ambulation and incentive spirometry importance with patient's RN - If additional episodes of hypoxia should occur, recommend pulmonary toilet prior to use of supplemental oxygen - Ultimately, with patient's history of advancing dementia, history of metastatic lung cancer, and extensive smoking history, Anne [name] may end up needing oxygen as needed in the future.  No indication for hospitalization in the setting.

## 2023-06-02 NOTE — ED Notes (Signed)
 Patient continuously pacing in hallway after being redirected back to room several times. Patient yelling and slamming doors. Patient attempting to swing at staff, cursing. Patient visibly agitated. MD Peggi Bowels notified and PRN meds requested.

## 2023-06-02 NOTE — ED Provider Notes (Signed)
.-----------------------------------------   8:38 AM on 06/02/2023 -----------------------------------------  Blood pressure 123/67, pulse 73, temperature 98.2 F (36.8 C), temperature source Oral, resp. rate 17, height 5\' 7"  (1.702 m), weight 68 kg, SpO2 (!) 87%.  Was notified by nurse that patient was satting 87% with a good waveform.  Placed on 2 L oxygen now with O2 at 92%.  She is not complaining of any chest pain or shortness of breath, received antibiotics for her UTI previously.  Will get repeat labs, EKG, chest x-ray.  She will need to be admitted for further management.  EKG on my interpretation shows normal sinus rhythm, rate 76, normal QRS, normal QTc, no ischemic ST elevation, not significant change compared to prior.  Independent review of labs imaging, CT angio without PE, patient refusing to wear her nasal cannula, discussed with hospitalist who evaluated the patient.  We would get her to stand and walk around, ambulatory sat was around 97% on room air.  Will hold off admission at this time given that hospitalist suspect this might be due to atelectasis and that she is now off oxygen without desat.  Will continue to monitor pending TOC placement.   Clinical Course as of 06/02/23 1310  Sun Jun 02, 2023  0912 DG Chest Portable 1 View No active disease.  [TT]  [phone] Independent review of repeat labs, troponins not elevated, electrolytes severely deranged, no leukocytosis.  Given the hypoxia, and chest x-ray not showing any focal consolidation, will get a CT PE protocol.  Will plan to have her admitted for further management. [TT]    Clinical Course User Index [TT] Shane Darling, MD      Shane Darling, MD 06/02/23 1311

## 2023-06-02 NOTE — ED Notes (Signed)
 Pt on phone talking to husband pt yelling for him to come and get her. Pt threw phone across the room and started yelling at staff and officers.

## 2023-06-02 NOTE — ED Notes (Signed)
 Pharmacy messaged regarding pt missing synthroid  medication.

## 2023-06-02 NOTE — ED Provider Notes (Signed)
 I noticed that patient had a UA concerning for UTI however she only got 1 dose of ceftriaxone  no urine culture was sent.  I have reordered urine and urine culture and given a dose of fosfomycin.  I have alerted the nurse.   Lubertha Rush, MD 06/02/23 [phone]

## 2023-06-02 NOTE — ED Notes (Signed)
 Admit MD at bedside

## 2023-06-02 NOTE — ED Notes (Signed)
 Pt provided with dinner tray.

## 2023-06-02 NOTE — Consult Note (Signed)
 Initial Consultation Note   Patient: Anne [name]:[id number] DOB: 04-17-1950 PCP: Pcp, No DOA: 05/29/2023 DOS: the patient was seen and examined on 06/02/2023 Primary service: No att. providers found  Referring physician: Dr. Drenda Gentle Reason for consult: Hypoxia  Assessment/Plan:  Hypoxia Patient had a period of transient hypoxia this morning as low as 87%.  Workup including BMP, troponin, chest x-ray, and CTA all without acute abnormalities.  Patient has been in the ER for 3 days with minimal movement and during this time has received multiple doses of IM/IV sedation due to severe agitation, including last night.  In light of this, suspect hypoventilation and atelectasis are likely culprits of transient hypoxia.  On examination, pulse ox maintained between 97 to 100% on room air.  At this time, no indication for hospital admission or further evaluation of hypoxia.  - Discussed ambulation and incentive spirometry importance with patient's RN - If additional episodes of hypoxia should occur, recommend pulmonary toilet prior to use of supplemental oxygen - Ultimately, with patient's history of advancing dementia, history of metastatic lung cancer, and extensive smoking history, she may end up needing oxygen as needed in the future.  No indication for hospitalization in the setting.  Dementia with behavioral disturbance [facility]) Patient has advancing vascular dementia with significant behavioral disturbances including aggression, agitation requiring Geodon  and Zyprexa .  - TOC working on placement - Continue home regimen   TRH will sign off at present, please call us  again when needed.  -----------------------------------------------------------------------------------  HPI: Anne [name] is a 73 y.o. 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  Anne [name] is unable to answer any questions regarding her history, or why she is in the ER.  Per chart review, patient's oxygen saturation was checked earlier today and noted to be 86-87% on room air with improvement to 92% on placement of 2 L.  At the time, she denied any symptoms including shortness of breath, cough.  ED course: On initial arrival to the ED, patient was normotensive at 118/76 with heart rate of 64.  She was saturating at 95% on room air.  She was afebrile 97.9 vital signs obtained earlier today with blood pressure of 123/67, heart rate of 73, oxygen saturation of 87% on room air.  Workup including CBC, CMP, BNP, troponin x 2, chest x-ray, viral panel, CTA all without acute abnormalities.  TRH contacted for admission..  Review of Systems: unable to review all systems due to the inability of the patient to answer questions.  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), in remission, 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   Metastatic bone cancer    Seizures (HCC)    Subdural hematoma (HCC)    Past Surgical History:  Procedure Laterality Date   APPENDECTOMY  1971   CHOLECYSTECTOMY  1971   COLONOSCOPY WITH PROPOFOL  N/A 11/15/2016   Procedure: COLONOSCOPY WITH PROPOFOL ;  Surgeon: Luke Salaam, MD;  Location: [facility] ENDOSCOPY;  Service: Gastroenterology;  Laterality: N/A;   ENDOBRONCHIAL ULTRASOUND Right 07/30/2018   Procedure: ENDOBRONCHIAL ULTRASOUND;  Surgeon: Enos Harts, MD;  Location: [facility] ORS;  Service: Pulmonary;  Laterality: Right;   ESOPHAGOGASTRODUODENOSCOPY (EGD) WITH PROPOFOL  N/A 01/07/2017   Procedure:  ESOPHAGOGASTRODUODENOSCOPY (EGD) WITH PROPOFOL ;  Surgeon: Luke Salaam, MD;  Location: [facility] ENDOSCOPY;  Service: Gastroenterology;  Laterality: N/A;   LAPAROSCOPY N/A 03/01/2017   Procedure: LAPAROSCOPY DIAGNOSTIC;  Surgeon: Marshall Skeeter, MD;  Location: [facility] ORS;  Service: General;  Laterality: N/A;   PORTA CATH INSERTION N/A 08/14/2018   Procedure: PORTA CATH INSERTION;  Surgeon: Celso College, MD;  Location: [facility] INVASIVE CV LAB;  Service: Cardiovascular;  Laterality: N/A;   TONSILECTOMY, ADENOIDECTOMY, BILATERAL MYRINGOTOMY AND TUBES  1955   TONSILLECTOMY     VENTRAL HERNIA REPAIR N/A 03/01/2017   10 x 14 CM Ventralight ST mesh, intraperitoneal location.    VENTRAL HERNIA REPAIR N/A 03/01/2017   Procedure: HERNIA REPAIR VENTRAL ADULT;  Surgeon: Marshall Skeeter, MD;  Location: [facility] ORS;  Service: General;  Laterality: N/A;   Social History:  reports that she quit smoking about 11 years ago. Her smoking use included cigarettes and e-cigarettes. She started smoking about 61 years ago. She has a 100 pack-year smoking history. She has never used smokeless tobacco. She reports that she does not drink alcohol and does not use drugs.  Allergies  Allergen Reactions   Bee Venom Swelling    Family History  Problem Relation Age of Onset   Hypertension Mother    Ovarian cancer Mother 37   Dementia Mother    Heart disease Father    Stroke Father    Seizures Father    Ovarian cancer Sister        ? dx cancer had hyst.   Breast cancer Neg Hx     Prior to Admission medications   Medication Sig Start Date End Date Taking? Authorizing Provider  acetaminophen  (TYLENOL ) 500 MG tablet Take 1,000 mg by mouth every 6 (six) hours as needed for moderate pain (pain score 4-6).   Yes [provider]  levETIRAcetam  (KEPPRA ) 1000 MG tablet Take 1 tablet (1,000 mg total) by mouth 2 (two) times daily. 03/08/23 03/02/24 Yes Cassandra Cleveland, MD  levothyroxine  (SYNTHROID ) 112 MCG tablet Take 1 tablet  (112 mcg total) by mouth daily at 6 (six) AM. 02/19/23  Yes Koomson, Rosalva Comber, MD  memantine  (NAMENDA ) 10 MG tablet Take 1 tablet (10 mg total) by mouth 2 (two) times daily. 05/10/23 08/08/23 Yes Mumma, Cathleen Coach, MD  Midazolam  (NAYZILAM ) 5 MG/0.1ML SOLN Place 5 mg into the nose as needed. 03/08/23  Yes Camara, Amadou, MD  Multiple Vitamin (MULTIVITAMIN) tablet Take 1 tablet by mouth daily.   Yes [provider]  QUEtiapine  (SEROQUEL ) 50 MG tablet Take 1 tablet (50 mg total) by mouth at bedtime. 05/10/23 08/08/23 Yes Mumma, Cathleen Coach, MD  rivastigmine  (EXELON ) 3 MG capsule Take 1 capsule (3 mg total) by mouth 2 (two) times daily. 11/14/22 11/09/23 Yes Cassandra Cleveland, MD  rosuvastatin  (CRESTOR ) 20 MG tablet Take 1 tablet (20 mg total) by mouth daily. 05/23/22  Yes Kent Pear, MD  sertraline  (ZOLOFT ) 100 MG tablet TAKE 1 & 1/2 (ONE & ONE-HALF) TABLETS BY MOUTH ONCE DAILY Patient taking differently: Take 100 mg by mouth daily. Along with 50mg  tablet total dose 150mg  02/27/23  Yes Kent Pear, MD  sertraline  (ZOLOFT ) 50 MG tablet Take 3 tablets (150 mg total) by mouth daily. Patient taking differently: Take 50 mg by mouth daily. Along with 100mg  tablet total dose 150mg  05/10/23 08/08/23 Yes Mumma, Cathleen Coach,  MD  amLODipine  (NORVASC ) 5 MG tablet Take 1 tablet by mouth once daily Patient not taking: Reported on 03/15/2023 11/15/22   Kent Pear, MD  lidocaine  (LIDODERM ) 5 % Place 1 patch onto the skin every 12 (twelve) hours. Remove & Discard patch within 12 hours or as directed by MD Patient not taking: Reported on 05/07/2023 03/05/23 03/04/24  Suellyn Emory, MD    Physical Exam: Vitals:   05/31/23 0865 06/01/23 0833 06/01/23 1740 06/02/23 0812  BP: 120/80 128/84 (!) 140/73 123/67  Pulse: 71 79 83 73  Resp: 17 18 18 17   Temp: 98.2 F (36.8 C) 98.2 F (36.8 C) 98.4 F (36.9 C) 98.2 F (36.8 C)  TempSrc: Oral Oral Oral Oral  SpO2: 95% 95% 94% (!) 87%  Weight:      Height:       Physical  Exam Vitals and nursing note reviewed.  Constitutional:      General: She is not in acute distress.    Appearance: She is normal weight.  HENT:     Head: Normocephalic and atraumatic.  Cardiovascular:     Rate and Rhythm: Normal rate and regular rhythm.     Heart sounds: No murmur heard. Pulmonary:     Effort: Pulmonary effort is normal. No respiratory distress.     Breath sounds: Normal breath sounds. No wheezing, rhonchi or rales.  Musculoskeletal:     Right lower leg: No edema.     Left lower leg: No edema.  Skin:    General: Skin is warm and dry.  Neurological:     Mental Status: She is alert. Mental status is at baseline. She is disoriented.    Data Reviewed:  CBC with WBC of 5.0, hemoglobin of 13.3, platelets of 129 CMP with sodium of 139, potassium 3.8, bicarb 27, glucose 85, BUN 23, creatinine 0.97, AST 41, ALT 23, GFR above 60 BNP 18 Troponin 4 and then 4 COVID-19, influenza and RSV PCR negative  EKG personally reviewed.  Sinus rhythm with rate of 76.  No acute ischemic changes.  CT Angio Chest Pulmonary Embolism (PE) W or WO Contrast Result Date: 06/02/2023 CLINICAL DATA:  Pulmonary embolism suspected EXAM: CT ANGIOGRAPHY CHEST WITH CONTRAST TECHNIQUE: Multidetector CT imaging of the chest was performed using the standard protocol during bolus administration of intravenous contrast. Multiplanar CT image reconstructions and MIPs were obtained to evaluate the vascular anatomy. Multiplanar image (3D post-processing) reconstructions and MIPs were obtained to evaluate the vascular anatomy. RADIATION DOSE REDUCTION: This exam was performed according to the departmental dose-optimization program which includes automated exposure control, adjustment of the mA and/or kV according to patient size and/or use of iterative reconstruction technique. CONTRAST:  75mL OMNIPAQUE  IOHEXOL  350 MG/ML SOLN COMPARISON:  CT angiogram of the chest performed May 14, 2023 FINDINGS: Cardiovascular:  Satisfactory opacification of the pulmonary arteries to the segmental level. No evidence of pulmonary embolism. Normal heart size. No pericardial effusion. Heart size is within normal limits. Atherosclerotic changes are present in the left and right coronary territories. Atherosclerotic changes are present in the thoracic aorta. Implantable port is present overlying the right hemithorax with catheter tubing terminating in the superior vena cava. Mediastinum/Nodes: No enlarged mediastinal, hilar, or axillary lymph nodes. Thyroid  gland, trachea, and esophagus demonstrate no significant findings. Lungs/Pleura: No pleural effusion or pneumothorax. None changes parents of small right upper lobe pulmonary nodules the largest measuring 4 mm, image 39 of series 5. Upper Abdomen: Cholecystectomy. Musculoskeletal: Degenerative changes in the imaged osseous structures.  Review of the MIP images confirms the above findings. IMPRESSION: 1. No evidence of pulmonary embolism to the subsegmental level. 2. Coronary artery atherosclerotic vascular disease. Electronically Signed   By: Reagan Camera M.D.   On: 06/02/2023 12:15   DG Chest Portable 1 View Result Date: 06/02/2023 CLINICAL DATA:  Hypoxia.  History of metastatic lung cancer. EXAM: PORTABLE CHEST 1 VIEW COMPARISON:  May 14, 2023. FINDINGS: The heart size and mediastinal contours are within normal limits. Right internal jugular Port-A-Cath is unchanged in position. Both lungs are clear. The visualized skeletal structures are unremarkable. IMPRESSION: No active disease. Electronically Signed   By: Rosalene Colon M.D.   On: 06/02/2023 08:41   CT Head Wo Contrast Result Date: 06/02/2023 CLINICAL DATA:  Trauma/fall EXAM: CT HEAD WITHOUT CONTRAST CT CERVICAL SPINE WITHOUT CONTRAST TECHNIQUE: Multidetector CT imaging of the head and cervical spine was performed following the standard protocol without intravenous contrast. Multiplanar CT image reconstructions of the cervical  spine were also generated. RADIATION DOSE REDUCTION: This exam was performed according to the departmental dose-optimization program which includes automated exposure control, adjustment of the mA and/or kV according to patient size and/or use of iterative reconstruction technique. COMPARISON:  05/16/2023 FINDINGS: CT HEAD FINDINGS Brain: Thin low-density left subdural hygroma, measuring 7 mm (coronal image 43), previously 8 mm. No evidence of acute infarction, hemorrhage, hydrocephalus, or mass lesion/mass effect. Subcortical white matter and periventricular small vessel ischemic changes. Vascular: Mild intracranial atherosclerosis. Skull: Normal. Negative for fracture or focal lesion. Sinuses/Orbits: The visualized paranasal sinuses are essentially clear. The mastoid air cells are unopacified. Other: None. CT CERVICAL SPINE FINDINGS Alignment: Normal cervical lordosis. Skull base and vertebrae: No acute fracture. No primary bone lesion or focal pathologic process. Soft tissues and spinal canal: No prevertebral fluid or swelling. No visible canal hematoma. Disc levels: Mild degenerative changes of the mid cervical spine. Spinal canal is patent. Upper chest: Visualized lung apices are clear. Other: None. IMPRESSION: Chronic low-density left subdural hygroma, stable versus mildly improved. No acute intracranial abnormality. Small vessel ischemic changes. No traumatic injury to the cervical spine. Mild degenerative changes. Electronically Signed   By: Zadie Herter M.D.   On: 06/02/2023 01:54   CT Cervical Spine Wo Contrast Result Date: 06/02/2023 CLINICAL DATA:  Trauma/fall EXAM: CT HEAD WITHOUT CONTRAST CT CERVICAL SPINE WITHOUT CONTRAST TECHNIQUE: Multidetector CT imaging of the head and cervical spine was performed following the standard protocol without intravenous contrast. Multiplanar CT image reconstructions of the cervical spine were also generated. RADIATION DOSE REDUCTION: This exam was performed  according to the departmental dose-optimization program which includes automated exposure control, adjustment of the mA and/or kV according to patient size and/or use of iterative reconstruction technique. COMPARISON:  05/16/2023 FINDINGS: CT HEAD FINDINGS Brain: Thin low-density left subdural hygroma, measuring 7 mm (coronal image 43), previously 8 mm. No evidence of acute infarction, hemorrhage, hydrocephalus, or mass lesion/mass effect. Subcortical white matter and periventricular small vessel ischemic changes. Vascular: Mild intracranial atherosclerosis. Skull: Normal. Negative for fracture or focal lesion. Sinuses/Orbits: The visualized paranasal sinuses are essentially clear. The mastoid air cells are unopacified. Other: None. CT CERVICAL SPINE FINDINGS Alignment: Normal cervical lordosis. Skull base and vertebrae: No acute fracture. No primary bone lesion or focal pathologic process. Soft tissues and spinal canal: No prevertebral fluid or swelling. No visible canal hematoma. Disc levels: Mild degenerative changes of the mid cervical spine. Spinal canal is patent. Upper chest: Visualized lung apices are clear. Other: None. IMPRESSION: Chronic low-density  left subdural hygroma, stable versus mildly improved. No acute intracranial abnormality. Small vessel ischemic changes. No traumatic injury to the cervical spine. Mild degenerative changes. Electronically Signed   By: Zadie Herter M.D.   On: 06/02/2023 01:54   DG Pelvis 1-2 Views Result Date: 06/02/2023 CLINICAL DATA:  Fall, pelvic injury EXAM: PELVIS - 1-2 VIEW COMPARISON:  None Available. FINDINGS: There is no evidence of pelvic fracture or diastasis. No pelvic bone lesions are seen. Vascular calcifications noted within the pelvis and medial thighs IMPRESSION: 1. Negative. Electronically Signed   By: Worthy Heads M.D.   On: 06/02/2023 01:18   There are no new results to review at this time.   Family Communication: No family at bedside Primary  team communication: Discussed with primary team in person Thank you very much for involving us  in the care of your patient.  Author: Avi Body, MD 06/02/2023 1:26 PM  For on call review [email or web address].

## 2023-06-02 NOTE — ED Notes (Signed)
 Pt instructed that we still need urine sample when she goes to the bathroom.

## 2023-06-02 NOTE — ED Notes (Signed)
Husband at bedside visiting pt.  

## 2023-06-03 NOTE — ED Notes (Signed)
Report received from Andrea, RN

## 2023-06-03 NOTE — ED Notes (Signed)
Snack given at this time

## 2023-06-03 NOTE — ED Notes (Signed)
 Vol toc placement

## 2023-06-03 NOTE — ED Provider Notes (Signed)
-----------------------------------------   5:37 AM on 06/03/2023 -----------------------------------------   Blood pressure 110/70, pulse 79, temperature 98 F (36.7 C), temperature source Oral, resp. rate 17, height 5\' 7"  (1.702 m), weight 68 kg, SpO2 99%.  The patient is calm and cooperative at this time.  There have been no acute events since the last update.  Awaiting disposition plan from Social Work team.   Cashay Manganelli J, MD 06/03/23 [phone]

## 2023-06-03 NOTE — ED Notes (Signed)
 Breakfast tray and juice provided by ED tech. Pt remains asleep and tray set up on bedside table.

## 2023-06-03 NOTE — ED Notes (Addendum)
 Pt out of room states she is mad. When asked why she states that her ride never got here so she is going to have to walk home. Pt reassured it was not time to leave yet and redirected back to her exam room. Pt calm at this time.

## 2023-06-03 NOTE — ED Notes (Signed)
 Pt keeps wandering in the hall. Pt tearful and agitated because she wants her husband to come pick her up and take her home.

## 2023-06-03 NOTE — ED Notes (Signed)
 Introduced self to pt. Pt cooperative at this time. Denies any SI/HI thoughts.

## 2023-06-03 NOTE — ED Notes (Signed)
 This RN called [facility] to have them come clean the floor in pt's room.

## 2023-06-03 NOTE — ED Notes (Signed)
 Pt to security desk wanting to know where her husband is. Pt states she is wanting to leave right and states if her husband doesn't get here in 3 minutes she is walking home. Pt reassured and re-directed back to her room. Informed that she will be discharged by the ED MD and she can not leave until they say she is safe to go. Pt appears agitated and speaking loudly to staff. Pt verbalized understanding that she can't walk home. Pt states "I really wouldn't last but about a mile anyway".

## 2023-06-03 NOTE — TOC Progression Note (Signed)
 Transition of Care [facility]) - Progression Note    Patient Details  Name: Anne [name] MRN: [id number] Date of Birth: Sep 30, 1950  [facility] CM/SW Contact  [name], RN Phone Number: 06/03/2023, 4:54 PM  Clinical Narrative:    Notified by supervisor that pt needs to be discharged home. She doesn't have funds to pay for a memory care unit. Called [facility], which was arranged at her previous DC, and they state [name] is on their do not admit list. Per [name]'s spouse he states they never called or came over. According to [name] they went to the home and [name] declined services. They also spoke to her daughter [name] at that time. It appears spouse wasn't home when [name] went over, and [name] was left alone. Supervisor Catha Clink consulted always [facility] and gave them a referral to assist [name] once Dc'd. Spoke with her spouse and asked him to pick pt up in the morning so she won't be sundowning and he verbalized understanding. Resources for dementia and support group printed out and will be given at DC. TOC to continue to follow        Expected Discharge Plan and Services                                               Social Determinants of Health (SDOH) Interventions SDOH Screenings   Food Insecurity: No Food Insecurity (04/24/2023)  Housing: Low Risk  (04/24/2023)  Transportation Needs: No Transportation Needs (04/24/2023)  Utilities: Not At Risk (04/24/2023)  Alcohol Screen: Low Risk  (02/22/2023)  Depression (PHQ2-9): High Risk (06/21/2022)  Financial Resource Strain: Low Risk  (02/22/2023)  Physical Activity: Inactive (02/22/2023)  Social Connections: Moderately Isolated (02/20/2023)  Stress: Patient Unable To Answer (02/22/2023)  Tobacco Use: Medium Risk (05/31/2023)  Health Literacy: Patient Unable To Answer (02/22/2023)    Readmission Risk Interventions     No data to display

## 2023-06-04 LAB — URINE CULTURE: Culture: 20000 — AB

## 2023-06-04 MED ORDER — HALOPERIDOL LACTATE 5 MG/ML IJ SOLN
2.0000 mg | Freq: Four times a day (QID) | INTRAMUSCULAR | Status: DC | PRN
Start: 2023-06-04 — End: 2023-06-05
  Administered 2023-06-04: 2 mg via INTRAMUSCULAR
  Filled 2023-06-04: qty 1

## 2023-06-04 NOTE — ED Notes (Signed)
Snack given to pt.

## 2023-06-04 NOTE — TOC Progression Note (Signed)
 Transition of Care [facility]) - Progression Note    Patient Details  Name: Anne [name] MRN: [id number] Date of Birth: 1950-02-13  [facility] Care [facility]) CM/SW Contact  Anne Haddock, LCSW Phone Number: 06/04/2023, 1:03 PM  Clinical Narrative:   Patient's husband states that they don't have finances to to pay for private support for patient.  Medicaid rep, Anne [name] will reach out to patient's daughter, Anne [name], about guardianship and explain the process of how assets are determined in order for patient to become eligible for Medicaid.  TOC to continue to follow.        Expected Discharge Plan and Services                                               Social Determinants of Health (SDOH) Interventions SDOH Screenings   Food Insecurity: No Food Insecurity (04/24/2023)  Housing: Low Risk  (04/24/2023)  Transportation Needs: No Transportation Needs (04/24/2023)  Utilities: Not At Risk (04/24/2023)  Alcohol Screen: Low Risk  (02/22/2023)  Depression (PHQ2-9): High Risk (06/21/2022)  Financial Resource Strain: Low Risk  (02/22/2023)  Physical Activity: Inactive (02/22/2023)  Social Connections: Moderately Isolated (02/20/2023)  Stress: Patient Unable To Answer (02/22/2023)  Tobacco Use: Medium Risk (05/31/2023)  Health Literacy: Patient Unable To Answer (02/22/2023)    Readmission Risk Interventions     No data to display

## 2023-06-04 NOTE — ED Notes (Signed)
 Pt was given the busy blanket to help sooth her fidgeting

## 2023-06-04 NOTE — ED Provider Notes (Signed)
-----------------------------------------   4:01 AM on 06/04/2023 -----------------------------------------   Blood pressure (!) 147/89, pulse 76, temperature 98 F (36.7 C), temperature source Oral, resp. rate 18, height 5\' 7"  (1.702 m), weight 68 kg, SpO2 96%.  The patient is calm and cooperative at this time.  There have been no acute events since the last update.  Awaiting disposition plan from case management/social work.    Jihan Mellette, Clover Dao, DO 06/04/23 [phone]

## 2023-06-04 NOTE — ED Notes (Signed)
 Husband visiting with pt security at bedside.  Sitter outside pt's door.

## 2023-06-04 NOTE — ED Notes (Signed)
 Hospital meal provided, pt tolerated w/o complaints.  Waste discarded appropriately.

## 2023-06-04 NOTE — ED Notes (Signed)
 Dinner given at this time

## 2023-06-04 NOTE — ED Notes (Signed)
 Pt agitated, out in hallway wanting to go home tonight.  Pt not understanding there is no plan for discharge at this time.  Meds given.  Security at bedside with NT and RN

## 2023-06-04 NOTE — ED Provider Notes (Signed)
-----------------------------------------   6:38 PM on 06/04/2023 -----------------------------------------   Blood pressure (!) 145/69, pulse 64, temperature 98.1 F (36.7 C), temperature source Oral, resp. rate 16, height 5\' 7"  (1.702 m), weight 68 kg, SpO2 96%.  The patient is calm and cooperative at this time.  There have been no acute events since the last update.  Awaiting disposition plan from case management/social work.  Long conversation with the husband at bedside.  At this time he does not feel safe taking the patient home.  Stated that he was concerned about her becoming too combative and sundowning.  Discussed the plan if she does go home and when she does go home and sundown states that his plan was to take her to the police department and see if they could calm her down.  States that he is still trying to get power of attorney in order to liquidate their assets in order to pay for a facility.  Patient becoming more combative.  Wandering in the hallways and are not interacting with multiple other patients.  Unable to verbally de-escalate.  Given IM Haldol  for agitation.    Viviano Ground, MD 06/04/23 [phone]

## 2023-06-04 NOTE — ED Notes (Signed)
 Vol /TOC will continue to follow

## 2023-06-04 NOTE — ED Notes (Signed)
 Breakfast tray provided for pt.

## 2023-06-05 NOTE — ED Notes (Signed)
 Pt provided with breakfast tray.

## 2023-06-05 NOTE — ED Notes (Signed)
 Vol toc placement

## 2023-06-05 NOTE — ED Notes (Signed)
 Husband given resources from [facility]

## 2023-06-05 NOTE — TOC Progression Note (Signed)
 Transition of Care [facility]) - Progression Note    Patient Details  Name: Anne [name] MRN: [id number] Date of Birth: [birth year]  Transition of Care [facility]) CM/SW Contact  Anne Nim, RN Phone Number: 06/05/2023, 11:23 AM  Clinical Narrative:     Cm called Anne [name] at [facility]. She states that she spoke with Anne daughter Anne [name] Monday evening. Anne [name] states that Anne [name] is in agreement with their services but wanted to talk with her sister Anne [name]. Anne [name] states that she called Anne [name] and Anne [name] but had to leave a voice message. At this time neither have called her back. CM informed her that patient is still in the ED. She plan to call [name] again this morning.         Expected Discharge Plan and Services                                               Social Determinants of Health (SDOH) Interventions SDOH Screenings   Food Insecurity: No Food Insecurity (04/24/2023)  Housing: Low Risk  (04/24/2023)  Transportation Needs: No Transportation Needs (04/24/2023)  Utilities: Not At Risk (04/24/2023)  Alcohol Screen: Low Risk  (02/22/2023)  Depression (PHQ2-9): High Risk (06/21/2022)  Financial Resource Strain: Low Risk  (02/22/2023)  Physical Activity: Inactive (02/22/2023)  Social Connections: Moderately Isolated (02/20/2023)  Stress: Patient Unable To Answer (02/22/2023)  Tobacco Use: Medium Risk (05/31/2023)  Health Literacy: Patient Unable To Answer (02/22/2023)    Readmission Risk Interventions     No data to display

## 2023-06-05 NOTE — ED Notes (Signed)
 Fall precautions in place for Pt. This RN placed fall band, fall grip socks, bed alarm and fall sign.

## 2023-06-05 NOTE — ED Provider Notes (Signed)
-----------------------------------------   11:05 AM on 06/05/2023 ----------------------------------------- Social worker has spoke to the patient and the husband.  Patient is ready for discharge.  They have tried to help set up home health care.  No other concerning medical findings.  Will discharge into the husband's care.   Ruth Cove, MD 06/05/23 1105

## 2023-06-05 NOTE — ED Provider Notes (Signed)
-----------------------------------------   7:04 AM on 06/05/2023 -----------------------------------------   Blood pressure 139/74, pulse 80, temperature 98 F (36.7 C), temperature source Oral, resp. rate 18, height 5\' 7"  (1.702 m), weight 68 kg, SpO2 95%.  The patient is calm and cooperative at this time.  There have been no acute events since the last update.  Awaiting disposition plan from case management/social work.    Ziah Leandro, Clover Dao, DO 06/05/23 [phone]

## 2023-06-05 NOTE — TOC Progression Note (Signed)
 Transition of Care [facility]) - Progression Note    Patient Details  Name: Anne [name] MRN: [id number] Date of Birth: 08-Dec-1950  Transition of Care [facility]) CM/SW Contact  Elmira Haddock, LCSW Phone Number: 06/05/2023, 10:42 AM  Clinical Narrative:   CSW phoned patient's husband to let him know that patient is ready for discharge.  CSW discussed with him that, Gurney Lefort, from [facility] has reached out to him and left a voicemail message, but he states that he hasn't spoken with anyone.  CSW also state that Gurney Lefort has spoken with daughter, Angela,and she reports that family could pay for aides in the home until [facility] can get the application approved.  Johnny, patient's husband, states that he's going to an appointment before he will here.  Johnny hung up on CSW.         Expected Discharge Plan and Services                                               Social Determinants of Health (SDOH) Interventions SDOH Screenings   Food Insecurity: No Food Insecurity (04/24/2023)  Housing: Low Risk  (04/24/2023)  Transportation Needs: No Transportation Needs (04/24/2023)  Utilities: Not At Risk (04/24/2023)  Alcohol Screen: Low Risk  (02/22/2023)  Depression (PHQ2-9): High Risk (06/21/2022)  Financial Resource Strain: Low Risk  (02/22/2023)  Physical Activity: Inactive (02/22/2023)  Social Connections: Moderately Isolated (02/20/2023)  Stress: Patient Unable To Answer (02/22/2023)  Tobacco Use: Medium Risk (05/31/2023)  Health Literacy: Patient Unable To Answer (02/22/2023)    Readmission Risk Interventions     No data to display

## 2023-06-07 ENCOUNTER — Ambulatory Visit: Admitting: Family Medicine

## 2023-06-07 ENCOUNTER — Telehealth: Payer: Self-pay | Admitting: *Deleted

## 2023-06-07 ENCOUNTER — Encounter: Payer: Self-pay | Admitting: Family Medicine

## 2023-06-07 ENCOUNTER — Encounter: Payer: PPO | Admitting: Nurse Practitioner

## 2023-06-07 VITALS — BP 116/72 | HR 74 | Temp 97.9°F | Resp 20 | Ht 67.0 in | Wt 148.0 lb

## 2023-06-07 DIAGNOSIS — M7022 Olecranon bursitis, left elbow: Secondary | ICD-10-CM

## 2023-06-07 DIAGNOSIS — G40909 Epilepsy, unspecified, not intractable, without status epilepticus: Secondary | ICD-10-CM

## 2023-06-07 DIAGNOSIS — E032 Hypothyroidism due to medicaments and other exogenous substances: Secondary | ICD-10-CM | POA: Diagnosis not present

## 2023-06-07 DIAGNOSIS — F03C11 Unspecified dementia, severe, with agitation: Secondary | ICD-10-CM

## 2023-06-07 LAB — TSH: TSH: 0.9 u[IU]/mL (ref 0.35–5.50)

## 2023-06-07 NOTE — Progress Notes (Signed)
 SUBJECTIVE:   Chief Complaint  Patient presents with   Hospitalization Follow-up   HPI Presents for follow up hospital visit  Discussed the use of AI scribe software for clinical note transcription with the patient, who gave verbal consent to proceed.  History of Present Illness Anne [name] is a 73 year old female with dementia who presents for follow-up after hospitalization for a urinary tract infection and head injury. She is accompanied by her friend, who is assisting with her care.  She was recently hospitalized for a urinary tract infection, which significantly exacerbated her dementia symptoms. Although not formally diagnosed with dementia, she experiences sundowning around 6:30 PM. Her friend notes that her dementia symptoms worsen significantly during infections.  She has a history of head injuries, having fallen and hit her head twice. The first incident involved hitting a sheetrock wall, and the second resulted in a seizure and a 28-day hospitalization at [facility]. A CT scan showed worsening bleeding on the brain, which may contribute to her dementia symptoms. She has seen a neurologist and a neurosurgeon, but her next neurology appointment is not until August.  She has a history of upper lobe cancer, which is currently in remission. She is also on medication for her thyroid , which she takes at 6 AM. Her friend ensures she takes her thyroid  medication regularly.  She is experiencing bursitis with significant fluid accumulation, which started as the size of a quarter and has grown to the size of a golf ball. It is not very painful.  She is on sertraline , taking 150 mg daily, which is managed by her neurologist. She also has medication for seizures, which she has not experienced recently. Her friend is managing her medication regimen, ensuring she takes the correct doses.  There is a concern about her financial and living situation, as her friend is trying to obtain  power of attorney to sell her house for placement in a facility. She has applied for Medicaid but was initially denied due to income limits. She is in the process of reapplying and seeking assistance from social services.    PERTINENT PMH / PSH: As above  OBJECTIVE:  BP 116/72   Pulse 74   Temp 97.9 F (36.6 C)   Resp 20   Ht 5\' 7"  (1.702 m)   Wt 148 lb (67.1 kg)   SpO2 95%   BMI 23.18 kg/m    Physical Exam Vitals reviewed.  Constitutional:      General: She is not in acute distress.    Appearance: Normal appearance. She is normal weight. She is not ill-appearing, toxic-appearing or diaphoretic.  Eyes:     General:        Right eye: No discharge.        Left eye: No discharge.     Conjunctiva/sclera: Conjunctivae normal.  Cardiovascular:     Rate and Rhythm: Normal rate and regular rhythm.     Heart sounds: Normal heart sounds.  Pulmonary:     Effort: Pulmonary effort is normal.     Breath sounds: Normal breath sounds.  Abdominal:     General: Bowel sounds are normal.  Musculoskeletal:        General: Normal range of motion.  Skin:    General: Skin is warm and dry.  Neurological:     Mental Status: She is alert. Mental status is at baseline.  Psychiatric:        Attention and Perception: She is attentive.  Behavior: Behavior is cooperative.        Cognition and Memory: Cognition is impaired. Memory is impaired. She exhibits impaired recent memory and impaired remote memory.           06/21/2022   10:47 AM 05/23/2022    2:35 PM 04/20/2022    9:44 AM 03/19/2022    1:46 PM 02/08/2022    8:15 AM  Depression screen PHQ 2/9  Decreased Interest  3 2 3  0  Down, Depressed, Hopeless  3 1 3  0  PHQ - 2 Score  6 3 6  0  Altered sleeping  2 2 0 0  Tired, decreased energy  3 3 3  0  Change in appetite  3 3 3  0  Feeling bad or failure about yourself   3 3 3  0  Trouble concentrating  2 1 0 0  Moving slowly or fidgety/restless  2 0 0 0  Suicidal thoughts  3 3 3  0   PHQ-9 Score  24 18 18  0  Difficult doing work/chores  Somewhat difficult Somewhat difficult Extremely dIfficult Not difficult at all     Information is confidential and restricted. Go to Review Flowsheets to unlock data.      06/21/2022   10:48 AM 05/23/2022    2:36 PM 03/19/2022    1:48 PM 02/08/2022    8:15 AM  GAD 7 : Generalized Anxiety Score  Nervous, Anxious, on Edge  3 2 0  Control/stop worrying  3 3 0  Worry too much - different things  3 3 0  Trouble relaxing  3 3 0  Restless  1 1 0  Easily annoyed or irritable  2 1 0  Afraid - awful might happen  1 3 0  Total GAD 7 Score  16 16 0  Anxiety Difficulty  Somewhat difficult Extremely difficult Not difficult at all     Information is confidential and restricted. Go to Review Flowsheets to unlock data.    ASSESSMENT/PLAN:  Hypothyroidism due to medication Assessment & Plan: Hypothyroidism managed with medication. Last thyroid  function test showed low levels - Check TSH - Unknown if taking Levothyroxine  112 mcg as prescribed   Orders: -     TSH  Severe dementia with agitation, unspecified dementia type [facility]) Assessment & Plan: Dementia with sundowning symptoms. Recent head trauma and subdural hematoma may contribute to cognitive decline. Neurology follow-up scheduled for August, but earlier evaluation may be necessary due to worsening symptoms. - Consider formal neurocognitive assessment for dementia diagnosis. - Coordinate with neurology for follow-up, possibly sooner than scheduled August appointment. - Consider geripsych referral.  Information provided to patient and caregiver. - Continue current medication, Rivastigmine , Nemanda and Zoloft .  Recommend switch Seroquel  to Rexulti for agitation.  Can discuss with PCP   Seizure disorder [facility]) Assessment & Plan: Seizure disorder following head trauma. No recent seizures reported. Medication management in place with Keppra  and Midazolam  spray as needed. - Continue current  seizure management medications as prescribed by neurology.   Olecranon bursitis of left elbow Assessment & Plan: Bursitis with significant fluid accumulation, increasing in size. No fever or systemic symptoms reported. Previous advice was to manage with ice, but fluid accumulation persists. - Recommend orthopedics for evaluation and possible drainage of fluid.     PDMP reviewed  Return in about 6 days (around 06/13/2023) for PCP.  Valli Gaw, MD

## 2023-06-07 NOTE — Patient Outreach (Signed)
 Return phone call [name] [phone] requesting phone call to patient and patient's spouse to assist with patient's placement. Patient has no current PCP, will need assistance with getting connected to primary care.    CSW will contact patient's spouse on  06/10/23.   Laiken Sandy, LCSW [place]  [facility], [facility] Licensed Clinical Social Worker Care Coordinator  Direct Dial: [phone]

## 2023-06-07 NOTE — Patient Instructions (Addendum)
 It was a pleasure meeting you today. Thank you for allowing me to take part in your health care.  Our goals for today as we discussed include:  Please go to [facility] to have left elbow possibly drained of fluid [address], [place], [name] [address] Phone: [phone]  Will check your thyroid  level today  Call to schedule appointment with Neurology   Can call  [facility], [place] [address], [place], [name] [address] Phone: [phone]  These are the goals we discussed:  This is a list of the screening recommended for you and due dates:  Health Maintenance  Topic Date Due   Medicare Annual Wellness Visit  09/08/2021   Colon Cancer Screening  11/15/2021   COVID-19 Vaccine (4 - 2024-25 season) 10/07/2022   Flu Shot  09/06/2023   DTaP/Tdap/Td vaccine (3 - Td or Tdap) 05/09/2031   Pneumonia Vaccine  Completed   DEXA scan (bone density measurement)  Completed   Hepatitis C Screening  Completed   HPV Vaccine  Aged Out   Meningitis B Vaccine  Aged Out   Zoster (Shingles) Vaccine  Discontinued      If you have any questions or concerns, please do not hesitate to call the office at [phone].  I look forward to our next visit and until then take care and stay safe.  Regards,   Valli Gaw, MD   [facility]

## 2023-06-12 ENCOUNTER — Telehealth: Payer: Self-pay | Admitting: *Deleted

## 2023-06-13 ENCOUNTER — Encounter: Payer: Self-pay | Admitting: *Deleted

## 2023-06-13 ENCOUNTER — Telehealth: Payer: Self-pay

## 2023-06-13 ENCOUNTER — Telehealth: Payer: Self-pay | Admitting: *Deleted

## 2023-06-13 ENCOUNTER — Encounter: Payer: PPO | Admitting: Nurse Practitioner

## 2023-06-13 ENCOUNTER — Other Ambulatory Visit: Payer: Self-pay | Admitting: Nurse Practitioner

## 2023-06-13 DIAGNOSIS — R4689 Other symptoms and signs involving appearance and behavior: Secondary | ICD-10-CM

## 2023-06-13 DIAGNOSIS — F03918 Unspecified dementia, unspecified severity, with other behavioral disturbance: Secondary | ICD-10-CM

## 2023-06-13 NOTE — Telephone Encounter (Signed)
 Lm to let patient know she has a 10:45 am appointment tomorrow, 06/14/2023 with Bluford Burkitt to have a referral for geriatrics ordered.   E2C2 please read to husband when he call. Thank you.

## 2023-06-14 ENCOUNTER — Encounter: Payer: Self-pay | Admitting: Nurse Practitioner

## 2023-06-14 ENCOUNTER — Ambulatory Visit: Admitting: Nurse Practitioner

## 2023-06-14 ENCOUNTER — Encounter: Payer: Self-pay | Admitting: Family Medicine

## 2023-06-14 VITALS — BP 130/60 | HR 71 | Temp 98.4°F | Ht 67.0 in | Wt 147.0 lb

## 2023-06-14 DIAGNOSIS — F03918 Unspecified dementia, unspecified severity, with other behavioral disturbance: Secondary | ICD-10-CM | POA: Diagnosis not present

## 2023-06-14 DIAGNOSIS — E032 Hypothyroidism due to medicaments and other exogenous substances: Secondary | ICD-10-CM

## 2023-06-14 DIAGNOSIS — M719 Bursopathy, unspecified: Secondary | ICD-10-CM | POA: Insufficient documentation

## 2023-06-14 DIAGNOSIS — G40909 Epilepsy, unspecified, not intractable, without status epilepticus: Secondary | ICD-10-CM | POA: Insufficient documentation

## 2023-06-14 MED ORDER — LEVOTHYROXINE SODIUM 112 MCG PO TABS: 112.0000 ug | ORAL_TABLET | Freq: Every day | ORAL | 3 refills | Status: DC

## 2023-06-14 NOTE — Assessment & Plan Note (Signed)
 Her thyroid  function is well-managed on Levothyroxine  with recent TSH normal. Continue Levothyroxine  112 mcg daily. Refills sent.

## 2023-06-14 NOTE — Assessment & Plan Note (Addendum)
 Dementia with sundowning symptoms. Recent head trauma and subdural hematoma may contribute to cognitive decline. Neurology follow-up scheduled for August, but earlier evaluation may be necessary due to worsening symptoms. - Consider formal neurocognitive assessment for dementia diagnosis. - Coordinate with neurology for follow-up, possibly sooner than scheduled August appointment. - Consider geripsych referral.  Information provided to patient and caregiver. - Continue current medication, Rivastigmine , Nemanda and Zoloft .  Recommend switch Seroquel  to Rexulti for agitation.  Can discuss with PCP

## 2023-06-14 NOTE — Assessment & Plan Note (Signed)
 Bursitis with significant fluid accumulation, increasing in size. No fever or systemic symptoms reported. Previous advice was to manage with ice, but fluid accumulation persists. - Recommend orthopedics for evaluation and possible drainage of fluid.

## 2023-06-14 NOTE — Assessment & Plan Note (Signed)
 Hypothyroidism managed with medication. Last thyroid  function test showed low levels - Check TSH - Unknown if taking Levothyroxine  112 mcg as prescribed

## 2023-06-14 NOTE — Assessment & Plan Note (Signed)
 She experiences progressive dementia with memory loss, disorientation and behavioral disturbances such as aggression and agitation. Call placed to [facility] for an earlier appointment with Dr. Samara Crest, unable to be seen sooner than August but was placed on wait list for cancellations. Contact social worker [employer] for resources and support, phone number provided to patient's husband. Advise against driving due to safety concerns.

## 2023-06-14 NOTE — Assessment & Plan Note (Signed)
 Seizure disorder following head trauma. No recent seizures reported. Medication management in place with Keppra  and Midazolam  spray as needed. - Continue current seizure management medications as prescribed by neurology.
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
## 2023-06-16 ENCOUNTER — Emergency Department
Admission: EM | Admit: 2023-06-16 | Discharge: 2023-06-22 | Disposition: A | Discharge status: Home / Self Care | Attending: Emergency Medicine | Admitting: Emergency Medicine

## 2023-06-16 ENCOUNTER — Other Ambulatory Visit: Payer: Self-pay

## 2023-06-16 DIAGNOSIS — F03918 Unspecified dementia, unspecified severity, with other behavioral disturbance: Secondary | ICD-10-CM

## 2023-06-16 DIAGNOSIS — Z79899 Other long term (current) drug therapy: Secondary | ICD-10-CM | POA: Diagnosis not present

## 2023-06-16 DIAGNOSIS — Z85830 Personal history of malignant neoplasm of bone: Secondary | ICD-10-CM | POA: Diagnosis not present

## 2023-06-16 DIAGNOSIS — Z85118 Personal history of other malignant neoplasm of bronchus and lung: Secondary | ICD-10-CM | POA: Diagnosis not present

## 2023-06-16 DIAGNOSIS — N390 Urinary tract infection, site not specified: Secondary | ICD-10-CM

## 2023-06-16 DIAGNOSIS — I10 Essential (primary) hypertension: Secondary | ICD-10-CM | POA: Insufficient documentation

## 2023-06-16 DIAGNOSIS — F0390 Unspecified dementia without behavioral disturbance: Secondary | ICD-10-CM | POA: Insufficient documentation

## 2023-06-16 DIAGNOSIS — R4689 Other symptoms and signs involving appearance and behavior: Secondary | ICD-10-CM

## 2023-06-16 DIAGNOSIS — F22 Delusional disorders: Secondary | ICD-10-CM | POA: Insufficient documentation

## 2023-06-16 DIAGNOSIS — Z7189 Other specified counseling: Secondary | ICD-10-CM | POA: Diagnosis not present

## 2023-06-16 DIAGNOSIS — F989 Unspecified behavioral and emotional disorders with onset usually occurring in childhood and adolescence: Secondary | ICD-10-CM | POA: Diagnosis present

## 2023-06-16 LAB — CBC WITH DIFFERENTIAL/PLATELET
Abs Immature Granulocytes: 0.02 10*3/uL (ref 0.00–0.07)
Basophils Absolute: 0 10*3/uL (ref 0.0–0.1)
Basophils Relative: 1 %
Eosinophils Absolute: 0 10*3/uL (ref 0.0–0.5)
Eosinophils Relative: 0 %
HCT: 45.6 % (ref 36.0–46.0)
Hemoglobin: 14.1 g/dL (ref 12.0–15.0)
Immature Granulocytes: 0 %
Lymphocytes Relative: 12 %
Lymphs Abs: 0.7 10*3/uL (ref 0.7–4.0)
MCH: 28.3 pg (ref 26.0–34.0)
MCHC: 30.9 g/dL (ref 30.0–36.0)
MCV: 91.6 fL (ref 80.0–100.0)
Monocytes Absolute: 0.2 10*3/uL (ref 0.1–1.0)
Monocytes Relative: 3 %
Neutro Abs: 5.2 10*3/uL (ref 1.7–7.7)
Neutrophils Relative %: 84 %
Platelets: 169 10*3/uL (ref 150–400)
RBC: 4.98 MIL/uL (ref 3.87–5.11)
RDW: 15.1 % (ref 11.5–15.5)
WBC: 6.1 10*3/uL (ref 4.0–10.5)
nRBC: 0 % (ref 0.0–0.2)

## 2023-06-16 LAB — COMPREHENSIVE METABOLIC PANEL WITH GFR
ALT: 16 U/L (ref 0–44)
AST: 28 U/L (ref 15–41)
Albumin: 4.1 g/dL (ref 3.5–5.0)
Alkaline Phosphatase: 73 U/L (ref 38–126)
Anion gap: 11 (ref 5–15)
BUN: 16 mg/dL (ref 8–23)
CO2: 29 mmol/L (ref 22–32)
Calcium: 9.9 mg/dL (ref 8.9–10.3)
Chloride: 101 mmol/L (ref 98–111)
Creatinine, Ser: 0.9 mg/dL (ref 0.44–1.00)
GFR, Estimated: >60 mL/min (ref >60)
Glucose, Bld: 115 mg/dL — ABNORMAL HIGH (ref 70–99)
Potassium: 3.9 mmol/L (ref 3.5–5.1)
Sodium: 141 mmol/L (ref 135–145)
Total Bilirubin: 0.6 mg/dL (ref 0.0–1.2)
Total Protein: 7.8 g/dL (ref 6.5–8.1)

## 2023-06-16 LAB — ETHANOL: Alcohol, Ethyl (B): <15 mg/dL (ref <15)

## 2023-06-16 LAB — URINE DRUG SCREEN, QUALITATIVE (ARMC ONLY)
Amphetamines, Ur Screen: NOT DETECTED
Barbiturates, Ur Screen: NOT DETECTED
Benzodiazepine, Ur Scrn: NOT DETECTED
Cannabinoid 50 Ng, Ur ~~LOC~~: NOT DETECTED
Cocaine Metabolite,Ur ~~LOC~~: NOT DETECTED
MDMA (Ecstasy)Ur Screen: NOT DETECTED
Methadone Scn, Ur: NOT DETECTED
Opiate, Ur Screen: NOT DETECTED
Phencyclidine (PCP) Ur S: NOT DETECTED
Tricyclic, Ur Screen: NOT DETECTED

## 2023-06-16 MED ORDER — LORAZEPAM 1 MG PO TABS
1.0000 mg | ORAL_TABLET | Freq: Once | ORAL | Status: AC | PRN
  Administered 2023-06-16: 1 mg via ORAL
  Filled 2023-06-16: qty 1

## 2023-06-16 NOTE — ED Notes (Signed)
 Pt out of room yelling in the hall. This RN directed pt back to room and sat and talked with pt for several minutes.

## 2023-06-16 NOTE — ED Notes (Signed)
 Pt upset and in the hall yelling. Pt not easily redirected back to room by security.

## 2023-06-16 NOTE — ED Notes (Signed)
 Pt dressed out with RN Sydni. Pt dressed into hospital provided scrubs. Belongings include 1 green shirt  1 pair of black shorts  1 pair of white shoes  1 pair of white underwear   Glasses are with Pt in the room.   All belongings are placed behind nurses desk at this time.
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
## 2023-06-16 NOTE — ED Notes (Signed)
Snack given at this time

## 2023-06-16 NOTE — Progress Notes (Signed)
 [facility] Consult Note  Patient Name: Anne [name] MRN: [id number] DOB: [birth year] DATE OF Consult: 06/16/2023  PRIMARY PSYCHIATRIC DIAGNOSES  1.  Major neurocognitive disorder, with delusions and behavioral dyscontrol   RECOMMENDATIONS  Recommendations: Medication recommendations: recommend transitioning from sertraline  to Citalopram which has better evidence in treating agitation in dementia. Decrease Sertraline  to 50 mg daily for 1 week then stop. At the same time start Citalopram 10 mg for 1 week then increase to 20 mg daily.     In addition, increase Quetiapine  to 50 mg with lunch ( noon) and 100 mg before bed)  Non-Medication/therapeutic recommendations: recommend placement in memory care facility Is inpatient psychiatric hospitalization recommended for this patient? No (Explain why): no benefit from inpatient psychiatric admission.  Is another care setting recommended for this patient? (examples may include Crisis Stabilization Unit, Residential/Recovery Treatment, ALF/SNF, Memory Care Unit)  Yes (Explain why): Memory care units for patients and husband's safety Follow-Up Telepsychiatry C/L services: We will sign off for now. Please re-consult our service if needed for any concerning changes in the patient's condition, discharge planning, or questions.  Thank you for involving us  in the care of this patient. If you have any additional questions or concerns, please call [phone] and ask for me or the provider on-call.  TELEPSYCHIATRY ATTESTATION & CONSENT  As the provider for this telehealth consult, I attest that I verified the patient's identity using two separate identifiers, introduced myself to the patient, provided my credentials, disclosed my location, and performed this encounter via a HIPAA-compliant, real-time, face-to-face, two-way, interactive audio and video platform and with the full consent and agreement of the patient (or guardian as applicable.)  Patient  physical location: [facility] Emergency Department at [facility] . Telehealth provider physical location: home office in state of [place] .  Video start time: 8 pm  (Central Time) Video end time: 8:30 pm  (Central Time)  IDENTIFYING DATA  Anne [name] is a 73 y.o. year-old female for whom a psychiatric consultation has been ordered by the primary provider. The patient was identified using two separate identifiers.  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  She has come to the emergency department several times recently for similar reasons.  Her husband reports that she has been aggressive at home, paranoid, saying that he kidnapped her, saying she wants to go back home even though they are home. When I see her, she is quite grumpy.  She tells me she lives alone because her husband took off with 3 of his buddies.  She says that otherwise, she does not have any complaints.  He denies any pain, any discomfort. She says she watches TV and likes to do puzzle but cannot tell me much more than that.  When I ask if she sleeps well at night, she says "not anymore". I did tell her that I would make recommendations for medication adjustments to get her feeling better and she is in agreement with that.Aaron Aas  PAST PSYCHIATRIC HISTORY  Dementia  Otherwise as per HPI above.  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   rosuvastatin  (CRESTOR ) 20 MG tablet Take 1 tablet (20 mg total) by mouth daily.   [Paused] amLODipine  (NORVASC ) 5 MG tablet Take 1 tablet by mouth once daily   rivastigmine  (EXELON ) 3 MG capsule Take 1 capsule (3 mg total) by mouth 2 (two) times daily.   acetaminophen  (TYLENOL ) 500 MG tablet Take 1,000 mg by mouth every 6 (six) hours as needed for moderate pain (pain score 4-6).   lidocaine  (LIDODERM ) 5 % Place 1 patch onto the skin every 12 (twelve) hours. Remove & Discard patch within 12 hours or as directed by MD   levETIRAcetam  (KEPPRA ) 1000 MG tablet Take 1 tablet (1,000 mg total) by mouth 2 (two) times daily.   Midazolam  (NAYZILAM ) 5 MG/0.1ML SOLN Place 5 mg into the nose as needed.   sertraline  (ZOLOFT ) 50 MG tablet Take 3 tablets (150 mg total) by mouth daily. (Patient taking differently: Take 50 mg by mouth daily. Along with 100mg  tablet total dose 150mg )   memantine  (NAMENDA ) 10 MG tablet Take 1 tablet (10 mg total) by mouth 2 (two) times daily.   QUEtiapine  (SEROQUEL ) 50 MG tablet Take 1 tablet (50 mg total) by mouth at bedtime.   levothyroxine  (SYNTHROID ) 112 MCG tablet Take 1 tablet (112 mcg total) by mouth daily before breakfast.     ALLERGIES  Allergies  Allergen Reactions   Bee Venom Swelling    SOCIAL & SUBSTANCE USE HISTORY  Social History   Socioeconomic History   Marital status: Divorced    Spouse name: Not on file   Number of children: 2   Years of education: Not on file   Highest education level: High school graduate  Occupational History   Not on file  Tobacco Use   Smoking status: Former    Current packs/day: 0.00    Average packs/day: 2.0 packs/day for 50.0 years (100.0 ttl pk-yrs)    Types: Cigarettes, E-cigarettes    Start date: 02/06/1962    Quit date: 02/07/2012    Years since quitting: 11.3    Smokeless tobacco: Never  Vaping Use   Vaping status: Former   Start date: 10/06/2013   Devices: uses no liquid  Substance and Sexual Activity   Alcohol use: No    Alcohol/week: 0.0 standard drinks of alcohol   Drug use: No   Sexual activity: Not Currently  Other Topics Concern   Not on file  Social History Narrative   Married   Retired   Engineer, manufacturing systems level of education    No children    1 cup of coffee   Social Drivers of Corporate investment banker Strain: Low Risk  (02/22/2023)   Overall Financial Resource Strain (CARDIA)    Difficulty of Paying Living Expenses: Not very hard  Food Insecurity: No Food Insecurity (04/24/2023)   Hunger Vital Sign    Worried About Running Out of Food in the Last Year: Never true    Ran Out of Food in the Last Year: Never true  Transportation Needs: No Transportation Needs (04/24/2023)   PRAPARE - Transportation    Lack  of Transportation (Medical): No    Lack of Transportation (Non-Medical): No  Physical Activity: Inactive (02/22/2023)   Exercise Vital Sign    Days of Exercise per Week: 0 days    Minutes of Exercise per Session: 0 min  Stress: Patient Unable To Answer (02/22/2023)   [employer] [employer] - Occupational Stress Questionnaire    Feeling of Stress : Patient unable to answer  Social Connections: Moderately Isolated (02/20/2023)   Social Connection and Isolation Panel [NHANES]    Frequency of Communication with Friends and Family: Once a week    Frequency of Social Gatherings with Friends and Family: More than three times a week    Attends Religious Services: Never    Database administrator or Organizations: No    Attends Engineer, structural: Never    Marital Status: Married   Social History   Tobacco Use  Smoking Status Former   Current packs/day: 0.00   Average packs/day: 2.0 packs/day for 50.0 years (100.0 ttl pk-yrs)   Types: Cigarettes, E-cigarettes   Start date: 02/06/1962   Quit date:  02/07/2012   Years since quitting: 11.3  Smokeless Tobacco Never   Social History   Substance and Sexual Activity  Alcohol Use No   Alcohol/week: 0.0 standard drinks of alcohol   Social History   Substance and Sexual Activity  Drug Use No     FAMILY HISTORY  Family History  Problem Relation Age of Onset   Hypertension Mother    Ovarian cancer Mother 58   Dementia Mother    Heart disease Father    Stroke Father    Seizures Father    Ovarian cancer Sister        ? dx cancer had hyst.   Breast cancer Neg Hx      MENTAL STATUS EXAM (MSE)  Mental Status Exam: General Appearance: Casual  Orientation:  Other:  oriented to person only   Memory:  Recent;   Poor  Concentration:  Concentration: Poor  Recall:  Poor  Attention  Poor  Eye Contact:  Fair  Speech:  Blocked  Language:  Fair  Volume:  Normal  Mood: anxious/ irritable   Affect:  Congruent  Thought Process:  Irrelevant  Thought Content:  Illogical and Delusions  Suicidal Thoughts:  No  Homicidal Thoughts:  No  Judgement:  Impaired  Insight:  Lacking  Psychomotor Activity:  Increased  Akathisia:  No  Fund of Knowledge:  Poor    Assets:  Social Support  Cognition:  Impaired,  Severe  ADL's:  Impaired  AIMS (if indicated):       VITALS  Blood pressure (!) 145/91, pulse 75, temperature 98.2 F (36.8 C), temperature source Oral, resp. rate 18, SpO2 95%.  LABS  Admission on 06/16/2023  Component Date Value Ref Range Status   WBC 06/16/2023 6.1  4.0 - 10.5 K/uL Final   RBC 06/16/2023 4.98  3.87 - 5.11 MIL/uL Final   Hemoglobin 06/16/2023 14.1  12.0 - 15.0 g/dL Final   HCT 16/11/9602 45.6  36.0 - 46.0 % Final   MCV 06/16/2023 91.6  80.0 - 100.0 fL Final   MCH 06/16/2023 28.3  26.0 - 34.0 pg Final   MCHC 06/16/2023 30.9  30.0 - 36.0 g/dL Final   RDW 54/10/8117 15.1  11.5 - 15.5 % Final   Platelets 06/16/2023 169  150 - 400 K/uL Final   nRBC 06/16/2023 0.0  0.0 - 0.2 % Final   Neutrophils Relative %  06/16/2023 84  % Final   Neutro Abs 06/16/2023 5.2  1.7 - 7.7 K/uL Final   Lymphocytes Relative 06/16/2023 12  % Final   Lymphs Abs 06/16/2023 0.7  0.7 - 4.0 K/uL Final   Monocytes Relative 06/16/2023 3  % Final   Monocytes Absolute 06/16/2023 0.2  0.1 - 1.0 K/uL Final   Eosinophils Relative 06/16/2023 0  % Final   Eosinophils Absolute 06/16/2023 0.0  0.0 - 0.5 K/uL Final   Basophils Relative 06/16/2023 1  % Final   Basophils Absolute 06/16/2023 0.0  0.0 - 0.1 K/uL Final   Immature Granulocytes 06/16/2023 0  % Final   Abs Immature Granulocytes 06/16/2023 0.02  0.00 - 0.07 K/uL Final   Performed at [facility], [address] [place], [name] [address]   Sodium 06/16/2023 141  135 - 145 mmol/L Final   Potassium 06/16/2023 3.9  3.5 - 5.1 mmol/L Final   Chloride 06/16/2023 101  98 - 111 mmol/L Final   CO2 06/16/2023 29  22 - 32 mmol/L Final   Glucose, Bld 06/16/2023 115 (H)  70 - 99 mg/dL Final   Glucose reference range applies only to samples taken after fasting for at least 8 hours.   BUN 06/16/2023 16  8 - 23 mg/dL Final   Creatinine, Ser 06/16/2023 0.90  0.44 - 1.00 mg/dL Final   Calcium  06/16/2023 9.9  8.9 - 10.3 mg/dL Final   Total Protein 02/72/5366 7.8  6.5 - 8.1 g/dL Final   Albumin 44/04/4740 4.1  3.5 - 5.0 g/dL Final   AST 59/56/3875 28  15 - 41 U/L Final   ALT 06/16/2023 16  0 - 44 U/L Final   Alkaline Phosphatase 06/16/2023 73  38 - 126 U/L Final   Total Bilirubin 06/16/2023 0.6  0.0 - 1.2 mg/dL Final   GFR, Estimated 06/16/2023 >60  >60 mL/min Final   Comment: (NOTE) Calculated using the CKD-EPI Creatinine Equation (2021)    Anion gap 06/16/2023 11  5 - 15 Final   Performed at [facility], [address] [place], [name] [address]   Alcohol, Ethyl (B) 06/16/2023 <15  <15 mg/dL Final   Comment: Please note change in reference range. (NOTE) For medical purposes only. Performed at [facility], [address] [place], [name]  [address]    Tricyclic, Ur Screen 06/16/2023 NONE DETECTED  NONE DETECTED Final   Amphetamines, Ur Screen 06/16/2023 NONE DETECTED  NONE DETECTED Final   MDMA (Ecstasy)Ur Screen 06/16/2023 NONE DETECTED  NONE DETECTED Final   Cocaine Metabolite,Ur [place] 06/16/2023 NONE DETECTED  NONE DETECTED Final   Opiate, Ur Screen 06/16/2023 NONE DETECTED  NONE DETECTED Final   Phencyclidine (PCP) Ur S 06/16/2023 NONE DETECTED  NONE DETECTED Final   Cannabinoid 50 Ng, Ur [place] 06/16/2023 NONE DETECTED  NONE DETECTED Final   Barbiturates, Ur Screen 06/16/2023 NONE DETECTED  NONE DETECTED Final   Benzodiazepine, Ur Scrn 06/16/2023 NONE DETECTED  NONE DETECTED Final   Methadone Scn, Ur 06/16/2023 NONE DETECTED  NONE DETECTED Final   Comment: (NOTE) Tricyclics + metabolites, urine    Cutoff 1000 ng/mL Amphetamines + metabolites, urine  Cutoff 1000 ng/mL MDMA (Ecstasy), urine              Cutoff 500 ng/mL Cocaine Metabolite, urine          Cutoff 300 ng/mL Opiate + metabolites, urine        Cutoff 300 ng/mL Phencyclidine (PCP), urine  Cutoff 25 ng/mL Cannabinoid, urine                 Cutoff 50 ng/mL Barbiturates + metabolites, urine  Cutoff 200 ng/mL Benzodiazepine, urine              Cutoff 200 ng/mL Methadone, urine                   Cutoff 300 ng/mL  The urine drug screen provides only a preliminary, unconfirmed analytical test result and should not be used for non-medical purposes. Clinical consideration and professional judgment should be applied to any positive drug screen result due to possible interfering substances. A more specific alternate chemical method must be used in order to obtain a confirmed analytical result. Gas chromatography / mass spectrometry (GC/MS) is the preferred confirm                          atory method. Performed at [facility], [address], [place], [name] [address]     PSYCHIATRIC REVIEW OF SYSTEMS (ROS)  ROS: Notable for the following relevant  positive findings: ROS  Additional findings:      Musculoskeletal: No abnormal movements observed      Gait & Station: Normal      Pain Screening: Denies        RISK FORMULATION/ASSESSMENT  Is the patient experiencing any suicidal or homicidal ideations: No   Protective factors considered for safety management: Supportive husband   Risk factors/concerns considered for safety management: impaired cognition    Is there a safety management plan with the patient and treatment team to minimize risk factors and promote protective factors: Yes           Explain: Placement in memory care facility  Is crisis care placement or psychiatric hospitalization recommended: Yes- Placement in secure memory care facility     Based on my current evaluation and risk assessment, patient is determined at this time to be at:  Moderate Risk of harm to self or husband due to impaired cognition and agitation  *RISK ASSESSMENT Risk assessment is a dynamic process; it is possible that this patient's condition, and risk level, may change. This should be re-evaluated and managed over time as appropriate. Please re-consult psychiatric consult services if additional assistance is needed in terms of risk assessment and management. If your team decides to discharge this patient, please advise the patient how to best access emergency psychiatric services, or to call 911, if their condition worsens or they feel unsafe in any way.   Davey Erp, MD Telepsychiatry Consult ServicesPatient ID: Everardo Hitch, female   DOB: 12-Aug-1950, 73 y.o.   MRN: [id number]

## 2023-06-16 NOTE — BH Assessment (Signed)
 Assessment Note  Anne [name] is an 73 y.o. female, with history of dementia with associated behavioral disturbances and multiple recent visits to our ER presenting to the emergency department for evaluation of delusions.  Per husband, patient has been saying that the has been kidnapped her and has been agitated and aggressive in the home.  Patient is agitated and aggressive in the ED and is a poor historian.    Diagnosis: Dementia  Past Medical History:  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), in remission, 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   Metastatic bone cancer    Seizures (HCC)    Subdural hematoma (HCC)     Past Surgical History:  Procedure Laterality Date   APPENDECTOMY  1971   CHOLECYSTECTOMY  1971   COLONOSCOPY WITH PROPOFOL  N/A 11/15/2016   Procedure: COLONOSCOPY WITH PROPOFOL ;  Surgeon: Luke Salaam, MD;  Location: [facility] ENDOSCOPY;  Service: Gastroenterology;  Laterality: N/A;   ENDOBRONCHIAL ULTRASOUND Right 07/30/2018   Procedure: ENDOBRONCHIAL ULTRASOUND;  Surgeon: Enos Harts, MD;  Location: [facility] ORS;  Service: Pulmonary;  Laterality: Right;   ESOPHAGOGASTRODUODENOSCOPY (EGD) WITH PROPOFOL  N/A 01/07/2017   Procedure: ESOPHAGOGASTRODUODENOSCOPY (EGD) WITH PROPOFOL ;  Surgeon: Luke Salaam, MD;  Location: [facility] ENDOSCOPY;  Service: Gastroenterology;  Laterality: N/A;   LAPAROSCOPY N/A 03/01/2017   Procedure: LAPAROSCOPY DIAGNOSTIC;  Surgeon: Marshall Skeeter, MD;  Location: [facility] ORS;  Service: General;  Laterality: N/A;   PORTA CATH INSERTION N/A 08/14/2018   Procedure: PORTA CATH INSERTION;  Surgeon: Celso College, MD;  Location: [facility] INVASIVE CV LAB;  Service: Cardiovascular;  Laterality: N/A;   TONSILECTOMY, ADENOIDECTOMY, BILATERAL MYRINGOTOMY AND TUBES  1955    TONSILLECTOMY     VENTRAL HERNIA REPAIR N/A 03/01/2017   10 x 14 CM Ventralight ST mesh, intraperitoneal location.    VENTRAL HERNIA REPAIR N/A 03/01/2017   Procedure: HERNIA REPAIR VENTRAL ADULT;  Surgeon: Marshall Skeeter, MD;  Location: [facility] ORS;  Service: General;  Laterality: N/A;    Family History:  Family History  Problem Relation Age of Onset   Hypertension Mother    Ovarian cancer Mother 32   Dementia Mother    Heart disease Father    Stroke Father    Seizures Father    Ovarian cancer Sister        ? dx cancer had hyst.   Breast cancer Neg Hx     Social History:  reports that she quit smoking about 11 years ago. Her smoking use included cigarettes and e-cigarettes. She started smoking about 61 years ago. She has a 100 pack-year smoking history. She has never used smokeless tobacco. She reports that she does not drink alcohol and does not use drugs.  Additional Social History:     CIWA: CIWA-Ar BP: (!) 145/91 Pulse Rate: 75 COWS:    Allergies:  Allergies  Allergen Reactions   Bee Venom Swelling    Home Medications: (Not in a hospital admission)   OB/GYN Status:  No LMP recorded. Patient is postmenopausal.  General Assessment Data Location of Assessment: [facility] ED    Abuse/Neglect Assessment (Assessment to be complete while patient is alone) Physical Abuse: Denies Verbal Abuse: Denies Sexual Abuse: Denies Exploitation of patient/patient's resources: Denies Self-Neglect: Denies  Advance Directives (For Healthcare) Does Patient Have a Medical Advance Directive?: No    Disposition: Disposition pending IRIS consult.    On Site Evaluation by:   Reviewed with Physician:    Zollie Hipp 06/16/2023 9:01 PM

## 2023-06-16 NOTE — ED Notes (Signed)
 TTS assessed pt.

## 2023-06-16 NOTE — ED Provider Notes (Signed)
 [facility] Provider Note    Event Date/Time   First MD Initiated Contact with Patient 06/16/23 1557     (approximate)   History   Dementia   HPI  Anne [name] is a 73 year old female with history of dementia with associated behavioral disturbances and multiple recent visits to our ER presenting to the emergency department for evaluation of delusions.  Per husband, patient has been saying that the has been kidnapped her and has been agitated and aggressive in the home.   I reviewed her most recent consult note from psychiatry from 05/31/2023.  At that time, patient reported SI initially.  Patient was cleared by psychiatry, but memory care was recommended.  Per TOC note from 06/05/2023, plan had been for patient to go to [facility], but was discharged home with plan for home aides until that time.      Physical Exam   Triage Vital Signs: ED Triage Vitals [06/16/23 1411]  Encounter Vitals Group     BP (!) 165/78     Systolic BP Percentile      Diastolic BP Percentile      Pulse Rate 77     Resp 16     Temp 98.3 F (36.8 C)     Temp Source Oral     SpO2 93 %     Weight      Height      Head Circumference      Peak Flow      Pain Score      Pain Loc      Pain Education      Exclude from Growth Chart     Most recent vital signs: Vitals:   06/16/23 1411  BP: (!) 165/78  Pulse: 77  Resp: 16  Temp: 98.3 F (36.8 C)  SpO2: 93%     General: Awake, interactive  CV:  Regular rate, good peripheral perfusion.  Resp:  Unlabored respirations.  Abd:  Nondistended.  Neuro:  Symmetric facial movement, fluid speech   ED Results / Procedures / Treatments   Labs (all labs ordered are listed, but only abnormal results are displayed) Labs Reviewed  COMPREHENSIVE METABOLIC PANEL WITH GFR - Abnormal; Notable for the following components:      Result Value   Glucose, Bld 115 (*)    All other components within normal limits  CBC WITH  DIFFERENTIAL/PLATELET  ETHANOL  URINE DRUG SCREEN, QUALITATIVE ([facility] ONLY)     EKG EKG independently reviewed interpreted by myself (ER attending) demonstrates:    RADIOLOGY Imaging independently reviewed and interpreted by myself demonstrates:   Formal Radiology Read:  No results found.  PROCEDURES:  Critical Care performed: No  Procedures   MEDICATIONS ORDERED IN ED: Medications - No data to display   IMPRESSION / MDM / ASSESSMENT AND PLAN / ED COURSE  I reviewed the triage vital signs and the nursing notes.  Differential diagnosis includes, but is not limited to, behavioral disturbance associated with known history of dementia, new primary psychiatric pathology, no clinical history suggestive of acute infection  Patient's presentation is most consistent with acute presentation with potential threat to life or bodily function.  73 year old female presenting with reported delusions.  Awake, interactive on my exam.  No reported SI or HI.  Do not think there is an indication for IVC.  Given report of worsening delusions, will consult psychiatry and TTS.  The patient has been placed in psychiatric observation due to the need  to provide a safe environment for the patient while obtaining psychiatric consultation and evaluation, as well as ongoing medical and medication management to treat the patient's condition.  The patient has not been placed under full IVC at this time.       FINAL CLINICAL IMPRESSION(S) / ED DIAGNOSES   Final diagnoses:  Delusions (HCC)     Rx / DC Orders   ED Discharge Orders     None        Note:  This document was prepared using Dragon voice recognition software and may include unintentional dictation errors.   Claria Crofts, MD 06/16/23 [phone]

## 2023-06-17 LAB — URINALYSIS, W/ REFLEX TO CULTURE (INFECTION SUSPECTED)
Bilirubin Urine: NEGATIVE
Glucose, UA: NEGATIVE mg/dL
Hgb urine dipstick: NEGATIVE
Ketones, ur: NEGATIVE mg/dL
Nitrite: NEGATIVE
Protein, ur: NEGATIVE mg/dL
Specific Gravity, Urine: 1.017 (ref 1.005–1.030)
WBC, UA: >50 WBC/hpf (ref 0–5)
pH: 5 (ref 5.0–8.0)

## 2023-06-17 LAB — VALPROIC ACID LEVEL: Valproic Acid Lvl: 49 ug/mL — ABNORMAL LOW (ref 50–100)

## 2023-06-17 MED ORDER — CITALOPRAM HYDROBROMIDE 20 MG PO TABS: 20.0000 mg | ORAL_TABLET | Freq: Every day | ORAL | Status: DC

## 2023-06-17 MED ORDER — LEVETIRACETAM 500 MG PO TABS
1000.0000 mg | ORAL_TABLET | Freq: Two times a day (BID) | ORAL | Status: DC
  Administered 2023-06-17 – 2023-06-22 (×11): 1000 mg via ORAL
  Filled 2023-06-17 (×12): qty 2

## 2023-06-17 MED ORDER — QUETIAPINE FUMARATE 25 MG PO TABS
50.0000 mg | ORAL_TABLET | Freq: Every day | ORAL | Status: DC
  Administered 2023-06-17 – 2023-06-21 (×5): 50 mg via ORAL
  Filled 2023-06-17 (×5): qty 2

## 2023-06-17 MED ORDER — CIPROFLOXACIN HCL 500 MG PO TABS
500.0000 mg | ORAL_TABLET | Freq: Two times a day (BID) | ORAL | Status: DC
  Administered 2023-06-17 – 2023-06-22 (×11): 500 mg via ORAL
  Filled 2023-06-17 (×10): qty 1

## 2023-06-17 MED ORDER — DIVALPROEX SODIUM 250 MG PO DR TAB
250.0000 mg | DELAYED_RELEASE_TABLET | Freq: Two times a day (BID) | ORAL | Status: DC
  Administered 2023-06-17 – 2023-06-22 (×11): 250 mg via ORAL
  Filled 2023-06-17 (×13): qty 1

## 2023-06-17 MED ORDER — LORAZEPAM 2 MG/ML IJ SOLN
2.0000 mg | Freq: Once | INTRAMUSCULAR | Status: AC | PRN
  Administered 2023-06-20: 2 mg via INTRAMUSCULAR
  Filled 2023-06-17: qty 1

## 2023-06-17 MED ORDER — SERTRALINE HCL 50 MG PO TABS
50.0000 mg | ORAL_TABLET | Freq: Every day | ORAL | Status: DC
  Administered 2023-06-17 – 2023-06-22 (×6): 50 mg via ORAL
  Filled 2023-06-17 (×6): qty 1

## 2023-06-17 MED ORDER — LEVOTHYROXINE SODIUM 112 MCG PO TABS
112.0000 ug | ORAL_TABLET | Freq: Every day | ORAL | Status: DC
  Administered 2023-06-17 – 2023-06-22 (×6): 112 ug via ORAL
  Filled 2023-06-17 (×6): qty 1

## 2023-06-17 MED ORDER — MEMANTINE HCL 5 MG PO TABS
10.0000 mg | ORAL_TABLET | Freq: Two times a day (BID) | ORAL | Status: DC
  Administered 2023-06-17 – 2023-06-22 (×11): 10 mg via ORAL
  Filled 2023-06-17 (×12): qty 2

## 2023-06-17 MED ORDER — RIVASTIGMINE TARTRATE 3 MG PO CAPS
3.0000 mg | ORAL_CAPSULE | Freq: Two times a day (BID) | ORAL | Status: DC
  Administered 2023-06-17 – 2023-06-22 (×10): 3 mg via ORAL
  Filled 2023-06-17 (×12): qty 1

## 2023-06-17 MED ORDER — ROSUVASTATIN CALCIUM 20 MG PO TABS
20.0000 mg | ORAL_TABLET | Freq: Every day | ORAL | Status: DC
  Administered 2023-06-17 – 2023-06-22 (×5): 20 mg via ORAL
  Filled 2023-06-17 (×6): qty 1

## 2023-06-17 MED ORDER — QUETIAPINE FUMARATE 25 MG PO TABS
100.0000 mg | ORAL_TABLET | Freq: Every day | ORAL | Status: DC
  Administered 2023-06-17 – 2023-06-21 (×5): 100 mg via ORAL
  Filled 2023-06-17 (×6): qty 4

## 2023-06-17 MED ORDER — ZIPRASIDONE MESYLATE 20 MG IM SOLR
10.0000 mg | Freq: Once | INTRAMUSCULAR | Status: AC
  Administered 2023-06-17: 10 mg via INTRAMUSCULAR
  Filled 2023-06-17: qty 20

## 2023-06-17 MED ORDER — LORAZEPAM 1 MG PO TABS
1.0000 mg | ORAL_TABLET | Freq: Once | ORAL | Status: AC
  Administered 2023-06-17: 1 mg via ORAL
  Filled 2023-06-17: qty 1

## 2023-06-17 MED ORDER — CITALOPRAM HYDROBROMIDE 20 MG PO TABS
10.0000 mg | ORAL_TABLET | Freq: Every day | ORAL | Status: DC
  Administered 2023-06-17 – 2023-06-22 (×6): 10 mg via ORAL
  Filled 2023-06-17 (×6): qty 1

## 2023-06-17 MED ORDER — CIPROFLOXACIN IN D5W 400 MG/200ML IV SOLN
400.0000 mg | Freq: Once | INTRAVENOUS | Status: AC
  Administered 2023-06-17: 400 mg via INTRAVENOUS

## 2023-06-17 MED ORDER — CIPROFLOXACIN IN D5W 400 MG/200ML IV SOLN
400.0000 mg | Freq: Once | INTRAVENOUS | Status: DC
  Filled 2023-06-17: qty 200

## 2023-06-17 NOTE — ED Notes (Signed)
 Pt laying on bed with eyes closed. Moving limbs with  even breathing.

## 2023-06-17 NOTE — ED Notes (Signed)
 Pt out in hall yelling, cussing, and hitting staff. PT medicated per MAR.

## 2023-06-17 NOTE — ED Notes (Signed)
 Sitter with pt after fall.

## 2023-06-17 NOTE — ED Notes (Signed)
Snack given at this time

## 2023-06-17 NOTE — ED Notes (Signed)
 Charge RN notified of pt fall. Pt not following directions, ordered 1:1 sitter.

## 2023-06-17 NOTE — ED Notes (Signed)
 Pt fell again in her room, bed alarm went off, pt on floor before staff could get to her,  pt was moving the mayo stand with her ice cream on it.  Pt denies injury   no loc.  Denies neck or back pain.  Pt assisted back to the bed.  Vital signs taken.  Charge nurse vanessa rn aware.   Dr wells er md notified.

## 2023-06-17 NOTE — ED Notes (Signed)
 Patient asleep at this time. Meal at nurses desk.

## 2023-06-17 NOTE — ED Notes (Signed)
 Pt refused night timed meds.  Pt confused.  Sitter with pt.

## 2023-06-17 NOTE — ED Notes (Signed)
 Pt continuing to stand in doorway and yell and cuss.
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
## 2023-06-17 NOTE — ED Notes (Signed)
 Husband of pt came for a visit This rn gave update to husband .   Pt sleeping.  Husband left.

## 2023-06-17 NOTE — Hospital Course (Signed)
 Anne [name]

## 2023-06-17 NOTE — ED Notes (Signed)
 Pt out in the hall yelling. Pt sat on the pt in the hall bed. Redirected back to her room by security.

## 2023-06-17 NOTE — ED Notes (Addendum)
 RN heard noise, RN immediately went into pt's room. Pt beside bed on the floor with right arm extended on ground. Pt states she was moving meal tray table and became off balance, falling out of bed. Pt states she caught herself with right arm and right leg. No visible injuries. Active ROM all 4 extremities. Pt does not complain of pain. Pt assisted to stand and back in bed. VS obtained and EDP Dr. Mona Angle informed. 1:1 sitter order placed due to patient not following directions. Charge Secondary school teacher informed.

## 2023-06-17 NOTE — ED Notes (Signed)
 Lunch provided.

## 2023-06-17 NOTE — ED Notes (Signed)
 Pt fell in her room
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
## 2023-06-17 NOTE — ED Notes (Signed)
 Pt given warm blankets and redirected back to her room.

## 2023-06-18 DIAGNOSIS — Z7189 Other specified counseling: Secondary | ICD-10-CM

## 2023-06-18 MED ORDER — LORAZEPAM 1 MG PO TABS
1.0000 mg | ORAL_TABLET | Freq: Once | ORAL | Status: AC
  Administered 2023-06-18: 1 mg via ORAL
  Filled 2023-06-18: qty 1

## 2023-06-18 MED ORDER — OLANZAPINE 5 MG PO TBDP
5.0000 mg | ORAL_TABLET | ORAL | Status: AC
  Administered 2023-06-18: 5 mg via ORAL
  Filled 2023-06-18: qty 1

## 2023-06-18 NOTE — ED Provider Notes (Signed)
-----------------------------------------   5:40 AM on 06/18/2023 -----------------------------------------   Blood pressure 136/85, pulse 76, temperature 97.6 F (36.4 C), temperature source Oral, resp. rate 16, SpO2 98%.  The patient is calm and cooperative at this time. PRN Ativan  given for agitation with good effect.  Awaiting disposition plan from Social Work team.   Blanca Thornton J, MD 06/18/23 [phone]
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
## 2023-06-18 NOTE — ED Notes (Signed)
vol/toc placement.. 

## 2023-06-18 NOTE — TOC Progression Note (Addendum)
 Transition of Care [facility]) - Progression Note    Patient Details  Name: Anne [name] MRN: [id number] Date of Birth: 1950/11/06  Transition of Care [facility]) CM/SW Contact  Elmira Haddock, LCSW Phone Number: 06/18/2023, 1:00 PM  Clinical Narrative:   CSW phoned patient's husband, [name], who requested a phone call.  LVM for a return call.  3:21pm  spoke with Shane Darling at [facility].  She states that pt's husband, [name], never reached out to her following pt's last visit to ED.  Per notes at last visit, he was supposed to call her and hire personal care support until Gurney Lefort could work with him to get approval for her services.  She states that she will reach out to him this evening.         Expected Discharge Plan and Services                                               Social Determinants of Health (SDOH) Interventions SDOH Screenings   Food Insecurity: No Food Insecurity (04/24/2023)  Housing: Low Risk  (04/24/2023)  Transportation Needs: No Transportation Needs (04/24/2023)  Utilities: Not At Risk (04/24/2023)  Alcohol Screen: Low Risk  (02/22/2023)  Depression (PHQ2-9): High Risk (06/21/2022)  Financial Resource Strain: Low Risk  (02/22/2023)  Physical Activity: Inactive (02/22/2023)  Social Connections: Moderately Isolated (02/20/2023)  Stress: Patient Unable To Answer (02/22/2023)  Tobacco Use: Medium Risk (06/14/2023)  Health Literacy: Patient Unable To Answer (02/22/2023)    Readmission Risk Interventions     No data to display

## 2023-06-18 NOTE — ED Notes (Signed)
Sitter with pt

## 2023-06-18 NOTE — ED Notes (Signed)
 Hospital meal provided.  100% consumed, pt tolerated w/o complaints.  Waste discarded appropriately.

## 2023-06-18 NOTE — ED Notes (Signed)
 Pt observed resting in bed, sitter at bedside, pt sits up and forward in the bed and points outside the room and states that her shell beans are out there, pt reassured easily

## 2023-06-18 NOTE — Progress Notes (Signed)
  Chaplain On-Call received a call from Spiritual Care Office Manager reporting the patient's husband was in the Spiritual Care department, and is requesting Chaplain support for his wife.  The patient's wife is this patient, Anne [name].  Chaplain attempted to visit. However, the patient is sleeping soundly.  Security Staff advised a later visit.  Chaplain will refer to the Oncoming Chaplain at 1630 hours.  Chaplain Dean Every., [facility]

## 2023-06-19 ENCOUNTER — Telehealth: Payer: Self-pay | Admitting: Nurse Practitioner

## 2023-06-19 DIAGNOSIS — Z7189 Other specified counseling: Secondary | ICD-10-CM | POA: Diagnosis not present

## 2023-06-19 NOTE — ED Notes (Signed)
 This tech feed this pt her dinner. This pt ate 50% of the food in the dinner tray.

## 2023-06-19 NOTE — ED Provider Notes (Signed)
 Emergency Medicine Observation Re-evaluation Note  Physical Exam   BP 103/67   Pulse 75   Temp 97.8 F (36.6 C) (Oral)   Resp 18   SpO2 97%   Patient appears in no acute distress.  ED Course / MDM   No reported events during my shift at the time of this note.   Pt is awaiting dispo from consultants   Buell Carmin MD    Buell Carmin, MD 06/19/23 0110

## 2023-06-19 NOTE — ED Notes (Signed)
Pt given snack at this time  

## 2023-06-19 NOTE — ED Notes (Signed)
 Meds given.

## 2023-06-19 NOTE — ED Notes (Signed)
 Resumed care from kat rn.  Sitter with pt.  Pt alert.

## 2023-06-19 NOTE — Progress Notes (Signed)
 Daily Progress Note   Patient Name: Anne [name]       Date: 06/19/2023 DOB: May 26, 1950  Age: 73 y.o. MRN#: [id number] Attending Physician: No att. providers found Primary Care Physician: Bluford Burkitt, NP Admit Date: 06/16/2023  Reason for Consultation/Follow-up: Establishing goals of care  Subjective: Notes reviewed. In to see patient. She is resting in bed in ED. Unable to have GOC discussion. Attempted to reach husband and got a busy signal.  Reattempted at a later time and was unable to reach him.  TOC is working with husband on discharge planning. Yesterday, husband discussed desire to speak with hospice liaison.   In to see patient.  She is currently resting in bed with eyes closed,  sitter at bedside.  Patient awoke to voice.  She discusses that her husband has not been to see her, and she is questioning her marriage.  She discusses that she has 2 children.  She returns to speaking about her husband and becomes agitated and tearful.  She states she is tired and is ready to go.  She states she has been ready to die if it is her time.  Per nursing, patient's husband was at the bedside earlier today.  She states he advised she would return this evening.  Nurse will plan to inquire when husband will be coming tomorrow and let PMT team know.  Length of Stay: 0  Current Medications: Scheduled Meds:   ciprofloxacin  500 mg Oral BID   citalopram  10 mg Oral Daily   [START ON 06/24/2023] citalopram  20 mg Oral Daily   divalproex   250 mg Oral BID   levETIRAcetam   1,000 mg Oral BID   levothyroxine   112 mcg Oral Q0600   memantine   10 mg Oral BID   QUEtiapine   100 mg Oral QHS   QUEtiapine   50 mg Oral Q lunch   rivastigmine   3 mg Oral BID   rosuvastatin   20 mg Oral Daily   sertraline   50 mg  Oral Daily    Continuous Infusions:   PRN Meds: LORazepam   Physical Exam Pulmonary:     Effort: Pulmonary effort is normal.  Neurological:     Mental Status: She is alert.             Vital Signs: BP 103/67   Pulse  75   Temp 97.8 F (36.6 C) (Oral)   Resp 18   SpO2 97%  SpO2: SpO2: 97 % O2 Device: O2 Device: Room Air O2 Flow Rate:    Intake/output summary: No intake or output data in the 24 hours ending 06/19/23 1030 LBM:   Baseline Weight:   Most recent weight:       Patient Active Problem List   Diagnosis Date Noted   Seizure disorder (HCC) 06/14/2023   Bursitis 06/14/2023   Hypoxia 06/02/2023   Memory impairment 05/31/2023   Aggressive behavior 05/06/2023   Dementia with agitation (HCC) 04/05/2023   Suicidal ideation 03/24/2023   Altered mental status 03/15/2023   Abnormal urinalysis 03/15/2023   Weakness 02/26/2023   Acute urinary retention 02/05/2023   Seizure (HCC) 01/22/2023   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/22/2023   Tremor 11/23/2022   Urinary urgency 03/26/2022   Sore throat 03/07/2022   Seizure-like activity (HCC) 02/08/2022   Seborrheic keratosis 09/20/2021   Closed fracture of nasal bones 07/26/2021   Falls frequently 07/26/2021   Head injury 07/07/2021   Left hip pain 07/07/2021   Lipoma 07/07/2021   Slurred speech 06/07/2021   Subdural hematoma (HCC) 01/23/2021   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   Aortic atherosclerosis (HCC) 08/24/2020   Dementia with behavioral disturbance (HCC) 03/14/2020   Left shoulder pain 07/22/2019   Hypothyroidism 02/16/2019   Goals of care, counseling/discussion 08/06/2018   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 Unable to reach husband. Husband has requested to speak with hospice liaison.  TOC is working with husband on dispo.   Code Status: Code Status History     Date Active Date Inactive Code Status Order ID Comments User Context   01/28/2023 1136 02/18/2023 2133 Limited: Do not attempt resuscitation (DNR) -DNR-LIMITED -Do Not Intubate/DNI  [id number]  Chrissie Coupe, MD Inpatient   01/22/2023 1400 01/28/2023 1136 Full Code [id number]  Chrissie Coupe, MD ED   04/05/2022 2143 04/06/2022 1704 Full Code [id number]  Loman Risk, MD ED   10/29/2018 1959 11/03/2018 1550 Full Code [id number]  Mayo, Mara Seminole, MD ED   03/01/2017 1248 03/02/2017 1651 Full Code [id number]  Marshall Skeeter, MD Inpatient   01/03/2017 1653 01/08/2017 1625 Full Code [id number]  Ben Bracken, MD Inpatient   11/25/2015 2142 11/28/2015 0932 Full Code [id number]  Elvin Hammer, MD Inpatient    Questions for Most Recent Historical Code Status (Order [id number])     Question Answer   If pulseless and not breathing No CPR or chest compressions.   In Pre-Arrest Conditions (Patient Is Breathing and Has A Pulse) Do not intubate. Provide all appropriate non-invasive medical interventions. Avoid ICU transfer unless indicated or required.   Consent: Discussion documented in EHR or advanced directives reviewed            Thank you for allowing the Palliative Medicine Team to assist in the care of this patient.  Meribeth Standard, NP  Please  contact Palliative Medicine Team phone at [phone] for questions and concerns.

## 2023-06-19 NOTE — ED Notes (Signed)
 Gilda Suiter, LCSW made aware that patient husband is bedside

## 2023-06-19 NOTE — Telephone Encounter (Signed)
 The patient's husband came into the office requesting a number to call social worker [employer]. The number he was given was not working,which is [phone]. I tried to call the number as well,however, I got the same response,  it is not a working number. I have reached out to see if there is another number for her.

## 2023-06-19 NOTE — TOC Progression Note (Signed)
 Transition of Care [facility]) - Progression Note    Patient Details  Name: Anne [name] MRN: [id number] Date of Birth: January 18, 1951  Transition of Care [facility]) CM/SW Contact  Elmira Haddock, LCSW Phone Number: 06/19/2023, 4:32 PM  Clinical Narrative:   CSW met with Earle Glatter, pt's husband, at bedside to discuss discharge plan and follow-up with [facility].  [name] stated that he had spoken with someone from Hospice and CSW saw that Palliative had seen patient this morning and tried reaching out to [facility] about goals of care meeting.  Johnny inquired about Medicaid application, however, pt was informed by CSW stated that he would need to reach back out to [facility] about any information concerning pt's application and assets.  CSW could not advise him about that process.    After speaking with [name], CSW spoke with Dr. Belenda Bowie to discuss d/c plan.  He states that patient is medically cleared and pt is also cleared from [facility] perspective.  Resources given to [facility] again to make contact. CSW reached out to Palliative via Epic chat that [name] was now on site.  Patient to possibly discharge tomorrow pending Palliative conversation.  TOC to continue to follow.         Expected Discharge Plan and Services                                               Social Determinants of Health (SDOH) Interventions SDOH Screenings   Food Insecurity: No Food Insecurity (04/24/2023)  Housing: Low Risk  (04/24/2023)  Transportation Needs: No Transportation Needs (04/24/2023)  Utilities: Not At Risk (04/24/2023)  Alcohol Screen: Low Risk  (02/22/2023)  Depression (PHQ2-9): High Risk (06/21/2022)  Financial Resource Strain: Low Risk  (02/22/2023)  Physical Activity: Inactive (02/22/2023)  Social Connections: Moderately Isolated (02/20/2023)  Stress: Patient Unable To Answer (02/22/2023)  Tobacco Use: Medium Risk (06/14/2023)  Health Literacy: Patient Unable To Answer (02/22/2023)     Readmission Risk Interventions     No data to display

## 2023-06-19 NOTE — ED Notes (Signed)
 This NT received handoff from [facility], pt resting comfortably at this time.

## 2023-06-19 NOTE — TOC CM/SW Note (Addendum)
 CSW phoned patient's husband and lvm for a return call.    Asked RN to let me know if he comes to [facility] so that I can speak with him.

## 2023-06-20 DIAGNOSIS — Z7189 Other specified counseling: Secondary | ICD-10-CM | POA: Diagnosis not present

## 2023-06-20 NOTE — ED Notes (Signed)
 Pt left room and has become agitated. Law enforcement at bedside. Pt stated " he needs his head chopped off". Pt is asking for daughter or she will " throw everything in room". Pt is currently sitting on bed with sitter, law enforcement and [place] tech at  bedside.

## 2023-06-20 NOTE — Progress Notes (Addendum)
 Daily Progress Note   Patient Name: Anne [name]       Date: 06/20/2023 DOB: November 02, 1950  Age: 73 y.o. MRN#: [id number] Attending Physician: No att. providers found Primary Care Physician: Bluford Burkitt, NP Admit Date: 06/16/2023  Reason for Consultation/Follow-up: Establishing goals of care  Subjective: Notes reviewed.  In to see patient.  She is currently resting in bed with eyes closed and appears to be asleep.  Sitter is at bedside.  Spoke with nursing who states the morning has been uneventful.  Spoke with TOC who is familiar with patient and states she is checking into Medicaid status this and placement options.  Discussed that husband has discussed concerns with not being able to make a mortgage payment without her, DSS, guardianship, and concerns for financial issues after discharge.    Called to speak with husband.  He quickly begins discussing and perseverates on previous conversational topics of concern about the mortgage payment and where he will live, DSS, guardianship so that he can sell the home, and other logistical concerns following discharge.  Message sent to [facility] to update.  Medication management as per primary team and psychiatry.  Would recommend goals of care conversations with husband when he is in a space to focus on the conversations.    Length of Stay: 0  Current Medications: Scheduled Meds:   ciprofloxacin  500 mg Oral BID   citalopram  10 mg Oral Daily   [START ON 06/24/2023] citalopram  20 mg Oral Daily   divalproex   250 mg Oral BID   levETIRAcetam   1,000 mg Oral BID   levothyroxine   112 mcg Oral Q0600   memantine   10 mg Oral BID   QUEtiapine   100 mg Oral QHS   QUEtiapine   50 mg Oral Q lunch   rivastigmine   3 mg Oral BID   rosuvastatin   20 mg Oral Daily    sertraline   50 mg Oral Daily    Continuous Infusions:   PRN Meds:   Physical Exam Constitutional:      Comments: Eyes closed  Pulmonary:     Effort: Pulmonary effort is normal.  Skin:    General: Skin is warm and dry.             Vital Signs: BP 119/76 (BP Location: Right Arm)   Pulse 79  Temp 97.9 F (36.6 C) (Oral)   Resp 16   SpO2 94%  SpO2: SpO2: 94 % O2 Device: O2 Device: Room Air O2 Flow Rate:    Intake/output summary:  Intake/Output Summary (Last 24 hours) at 06/20/2023 1135 Last data filed at 06/19/2023 1422 Gross per 24 hour  Intake 240 ml  Output --  Net 240 ml   LBM:   Baseline Weight:   Most recent weight:      Patient Active Problem List   Diagnosis Date Noted   Seizure disorder (HCC) 06/14/2023   Bursitis 06/14/2023   Hypoxia 06/02/2023   Memory impairment 05/31/2023   Aggressive behavior 05/06/2023   Dementia with agitation (HCC) 04/05/2023   Suicidal ideation 03/24/2023   Altered mental status 03/15/2023   Abnormal urinalysis 03/15/2023   Weakness 02/26/2023   Acute urinary retention 02/05/2023   Seizure (HCC) 01/22/2023   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/22/2023   Tremor 11/23/2022   Urinary urgency 03/26/2022   Sore throat 03/07/2022   Seizure-like activity (HCC) 02/08/2022   Seborrheic keratosis 09/20/2021   Closed fracture of nasal bones 07/26/2021   Falls frequently 07/26/2021   Head injury 07/07/2021   Left hip pain 07/07/2021   Lipoma 07/07/2021   Slurred speech 06/07/2021   Subdural hematoma (HCC) 01/23/2021   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   Aortic atherosclerosis (HCC) 08/24/2020   Dementia with behavioral disturbance (HCC) 03/14/2020   Left shoulder pain 07/22/2019   Hypothyroidism 02/16/2019   Goals of care, counseling/discussion 08/06/2018   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), in remission, 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    Skin tear of forearm without complication, initial encounter 12/06/2017   Bruising 12/06/2017   Overweight 08/05/2017   Anemia 01/23/2017   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   Gastritis 01/23/2017   Hypokalemia 01/03/2017   Heart murmur 07/27/2016   Low back pain 08/15/2015   Hypertension 02/22/2015   HLD (hyperlipidemia) 02/22/2015   Depression 02/22/2015    Palliative Care Assessment & Plan   Recommendations/Plan: Patient's husband is very concerned with their home and where he will live if patient goes to a facility.  He has additional concerns of which he is focused on following discharge regarding finances and logistics.  TOC is working with husband regarding planning.   Would recommend goals of care discussions when husband is in a space to focus on the conversations. PMT will sign off.  Please reconsult if needs arise.  Code Status: Code Status History     Date Active Date Inactive Code Status Order ID Comments User Context   01/28/2023 1136 02/18/2023 2133 Limited: Do not attempt resuscitation (DNR) -DNR-LIMITED -Do Not Intubate/DNI  [id number]  Chrissie Coupe, MD Inpatient   01/22/2023 1400 01/28/2023 1136 Full Code [id number]  Chrissie Coupe, MD ED   04/05/2022 2143 04/06/2022 1704 Full Code [id number]  Loman Risk, MD ED   10/29/2018 1959 11/03/2018 1550 Full Code [id number]  Mayo, Mara Seminole, MD ED   03/01/2017 1248 03/02/2017 1651 Full Code [id number]  Marshall Skeeter, MD Inpatient   01/03/2017 1653 01/08/2017 1625 Full Code [id number]  Ben Bracken, MD Inpatient   11/25/2015 2142 11/28/2015 0932 Full Code [id number]  Elvin Hammer, MD Inpatient    Questions for Most Recent Historical Code Status (Order [id number])     Question Answer   If pulseless and not breathing No CPR or chest compressions.   In Pre-Arrest Conditions (Patient Is Breathing and Has A  Pulse) Do not intubate. Provide all appropriate non-invasive medical interventions. Avoid ICU transfer  unless indicated or required.   Consent: Discussion documented in EHR or advanced directives reviewed           Care plan was discussed with [facility]  Thank you for allowing the Palliative Medicine Team to assist in the care of this patient.    Meribeth Standard, NP  Please contact Palliative Medicine Team phone at [phone] for questions and concerns.

## 2023-06-20 NOTE — TOC Progression Note (Addendum)
 Transition of Care [facility]) - Progression Note    Patient Details  Name: Anne [name] MRN: [id number] Date of Birth: 1950-11-26  Transition [facility] Care [facility]) CM/SW Contact  [name], RN Phone Number: 06/20/2023, 11:10 AM  Clinical Narrative:    5/15: Cleatus Curlin at [facility] to discuss Sander's discharge plan. Had to leave a message requesting a call back. Pt now has active [place] Medicaid but it's for family planning so may not cover a memory care unit. 1225: Spoke with Gurney Lefort from [facility]. They've spoken to her spouse [name] about assisting Charleene in the home with caregiving, as well as providing resources for placement. Per Rebeca Camps has declined the services offered by them. Pt now has [place] Medicaid- [employer]. Will try to find a memory care unit that may accept this and try to flip it to regular medicaid. 1400: Spoke to [place] from [facility] and she states they would consider taking Gini if her aggression was better managed with medication. 1450: Called and left a message at [facility] to inquire about placement there. 1630: Tried calling again but goes to voicemail. Called [facility] nursing [facility] (asked to try them from her spouse) and they don't have a MC unit. 1600: Attempted to call spouse [name] and had to leave a message requesting a call back. Per chart review pt has had some recent med changes. Message sent to doctor to please make a note indicating any med changes so we may show this to a potential memory care unit. Unable to reach spouse by the end of my shift. TOC to continue to follow.         Expected Discharge Plan and Services                                               Social Determinants of Health (SDOH) Interventions SDOH Screenings   Food Insecurity: No Food Insecurity (04/24/2023)  Housing: Low Risk  (04/24/2023)  Transportation Needs: No Transportation Needs (04/24/2023)  Utilities: Not At Risk (04/24/2023)   Alcohol Screen: Low Risk  (02/22/2023)  Depression (PHQ2-9): High Risk (06/21/2022)  Financial Resource Strain: Low Risk  (02/22/2023)  Physical Activity: Inactive (02/22/2023)  Social Connections: Moderately Isolated (02/20/2023)  Stress: Patient Unable To Answer (02/22/2023)  Tobacco Use: Medium Risk (06/14/2023)  Health Literacy: Patient Unable To Answer (02/22/2023)    Readmission Risk Interventions     No data to display

## 2023-06-20 NOTE — ED Provider Notes (Signed)
-----------------------------------------   6:05 PM on 06/20/2023 -----------------------------------------   Blood pressure 119/76, pulse 79, temperature 97.9 F (36.6 C), temperature source Oral, resp. rate 16, SpO2 94%.  The patient is calm and cooperative at this time.  Memory care facility requesting update on any medication changes while patient has been here.  Reviewed psychiatry note on 06/16/2023.  Changes as below.  - transition from sertraline  to citalopram 10 mg daily which will then be increased to 20 mg daily in 1 week - Sertraline  decreased to 50 mg for 1 week and then will be stopped - Increase quetiapine  to 50 mg with lunch and 100 mg before bed   No other changes noted.   Kandee Orion, MD 06/20/23 [phone]

## 2023-06-20 NOTE — ED Notes (Signed)
 Pt ate 100% of breakfast meal, tray disposed of appropriately.

## 2023-06-20 NOTE — ED Notes (Signed)
 This tech assisted pt to the bathroom. Pt was unsteady on feet. Pt urinated in toilet. This tech assisted pt back to bed. Sitter at bedside.

## 2023-06-20 NOTE — ED Provider Notes (Signed)
-----------------------------------------   5:40 AM on 06/20/2023 -----------------------------------------   Blood pressure 101/87, pulse 88, temperature 98.1 F (36.7 C), temperature source Oral, resp. rate 18, SpO2 95%.  The patient is calm and cooperative at this time.  There have been no acute events since the last update.  Awaiting disposition plan from Social Work team.   [name] J, MD 06/20/23 [phone]

## 2023-06-20 NOTE — ED Notes (Signed)
 Husband at bedside.

## 2023-06-20 NOTE — ED Notes (Signed)
 This tech assisted pt to the bathroom. Pt urinated in toilet. This tech assisted pt back in bed. Sitter remains at bedside.

## 2023-06-20 NOTE — Progress Notes (Signed)
   06/20/23 1300  Spiritual Encounters  Type of Visit Attempt (pt unavailable)  Referral source Other (comment) (Allie/Cheryl at [facility])  Reason for visit Routine spiritual support  OnCall Visit No   Patient sleeping and chaplain was asked to allow patient to rest.

## 2023-06-20 NOTE — ED Notes (Signed)
 Lunch tray provided. 80% consumed. Meal tray properly discarded.

## 2023-06-20 NOTE — ED Notes (Signed)
VOL/TOC Placement Pending

## 2023-06-21 MED ORDER — LORAZEPAM 2 MG/ML IJ SOLN
2.0000 mg | Freq: Once | INTRAMUSCULAR | Status: AC
  Administered 2023-06-21: 2 mg via INTRAMUSCULAR
  Filled 2023-06-21: qty 1

## 2023-06-21 NOTE — ED Notes (Addendum)
 Pt trying to get out of bed. Given warm blankets and is resting.

## 2023-06-21 NOTE — ED Notes (Addendum)
 Pt ate [place] sandwich and half of bag of chips.

## 2023-06-21 NOTE — ED Notes (Signed)
 Pt assisted to portable commode placed at bedside. Pt back in bed with no complaints.

## 2023-06-21 NOTE — Progress Notes (Signed)
   06/21/23 1045  Spiritual Encounters  Type of Visit Initial  Care provided to: Pt not available  Conversation partners present during encounter Nurse  Reason for visit Routine spiritual support  OnCall Visit No   Chaplain visited patient per suggestion from staff.  Patient was sleeping but staff member in the room shared spouse would likely visit today.  Staff shared spouse may be a bit overwhelmed so, Chaplain gave on-call pager phone number so that staff can call Chaplain if patient and/or spouse would like a visit.    Rev. Rana M. Nolon Baxter, M.Div. Chaplain Resident [facility]

## 2023-06-21 NOTE — TOC Progression Note (Addendum)
 Transition of Care [facility]) - Progression Note    Patient Details  Name: Anne [name] MRN: [id number] Date of Birth: [birth year]  Transition of Care [facility]) CM/SW Contact  [name], RN Phone Number: 06/21/2023, 11:22 AM  Clinical Narrative:    Spoke with spouse Johnny. Notified that [facility] doesn't have a memory care unit. Reviewed that [facility] won't accept her until behaviors are improved with medications. Doctors note indicates med changes were made 06/16/23, 5 days ago. Discussed with spouse that psych med's take about 2 weeks for us  to see the full effect and he verbalized understanding. He's on his way to see her and I will meet him when he arrives. Left another message for Chanelle at [facility] as I want to inquire if they can take her. 1200: Spouse Johnny here and discussed he will need to take her home if we can't find placement today and he verbalized understanding. He continues to ask about hospice for her and directed him to discuss either with the ED physician or her PCP. 1300: Had a return call from [place] at [facility] and she states they can't take her with medicaid family planning. Called and left message for her spouse stating [facility] can't take her with the current insurance. TOC to continue to follow.         Expected Discharge Plan and Services                                               Social Determinants of Health (SDOH) Interventions SDOH Screenings   Food Insecurity: No Food Insecurity (04/24/2023)  Housing: Low Risk  (04/24/2023)  Transportation Needs: No Transportation Needs (04/24/2023)  Utilities: Not At Risk (04/24/2023)  Alcohol Screen: Low Risk  (02/22/2023)  Depression (PHQ2-9): High Risk (06/21/2022)  Financial Resource Strain: Low Risk  (02/22/2023)  Physical Activity: Inactive (02/22/2023)  Social Connections: Moderately Isolated (02/20/2023)  Stress: Patient Unable To Answer (02/22/2023)  Tobacco Use: Medium Risk  (06/14/2023)  Health Literacy: Patient Unable To Answer (02/22/2023)    Readmission Risk Interventions     No data to display

## 2023-06-21 NOTE — ED Notes (Signed)
 Pt trying to get out of bed. Yelling at staff.

## 2023-06-21 NOTE — ED Notes (Signed)
vol/toc placement.. 

## 2023-06-21 NOTE — ED Notes (Signed)
 Pt cooperative with medication administration

## 2023-06-21 NOTE — ED Notes (Signed)
 Pt ate one fruit cup at this time.

## 2023-06-21 NOTE — ED Provider Notes (Signed)
-----------------------------------------   3:25 AM on 06/21/2023 ----------------------------------------- Patient becoming increasingly agitated.  Unable to redirect.  We will dose 2 mg of IM Ativan  for patient and staff safety.   Ruth Cove, MD 06/21/23 [phone]

## 2023-06-21 NOTE — ED Notes (Signed)
 This RN to bedside. Pt is sleeping.

## 2023-06-22 ENCOUNTER — Encounter (HOSPITAL_COMMUNITY): Payer: Self-pay | Admitting: *Deleted

## 2023-06-22 ENCOUNTER — Other Ambulatory Visit: Payer: Self-pay

## 2023-06-22 ENCOUNTER — Emergency Department (HOSPITAL_COMMUNITY)
Admission: EM | Admit: 2023-06-22 | Discharge: 2023-06-23 | Disposition: A | Discharge status: Home / Self Care | Attending: Emergency Medicine | Admitting: Emergency Medicine

## 2023-06-22 DIAGNOSIS — R451 Restlessness and agitation: Secondary | ICD-10-CM | POA: Insufficient documentation

## 2023-06-22 DIAGNOSIS — Z85830 Personal history of malignant neoplasm of bone: Secondary | ICD-10-CM | POA: Insufficient documentation

## 2023-06-22 DIAGNOSIS — Z79899 Other long term (current) drug therapy: Secondary | ICD-10-CM | POA: Diagnosis not present

## 2023-06-22 DIAGNOSIS — Z85118 Personal history of other malignant neoplasm of bronchus and lung: Secondary | ICD-10-CM | POA: Diagnosis not present

## 2023-06-22 DIAGNOSIS — F03918 Unspecified dementia, unspecified severity, with other behavioral disturbance: Secondary | ICD-10-CM

## 2023-06-22 DIAGNOSIS — F03911 Unspecified dementia, unspecified severity, with agitation: Secondary | ICD-10-CM | POA: Insufficient documentation

## 2023-06-22 DIAGNOSIS — I10 Essential (primary) hypertension: Secondary | ICD-10-CM | POA: Insufficient documentation

## 2023-06-22 LAB — URINALYSIS, ROUTINE W REFLEX MICROSCOPIC
Bacteria, UA: NONE SEEN
Bilirubin Urine: NEGATIVE
Glucose, UA: NEGATIVE mg/dL
Hgb urine dipstick: NEGATIVE
Ketones, ur: NEGATIVE mg/dL
Nitrite: NEGATIVE
Protein, ur: NEGATIVE mg/dL
Specific Gravity, Urine: 1.024 (ref 1.005–1.030)
pH: 5 (ref 5.0–8.0)

## 2023-06-22 LAB — COMPREHENSIVE METABOLIC PANEL WITH GFR
ALT: 18 U/L (ref 0–44)
AST: 30 U/L (ref 15–41)
Albumin: 3.5 g/dL (ref 3.5–5.0)
Alkaline Phosphatase: 66 U/L (ref 38–126)
Anion gap: 10 (ref 5–15)
BUN: 25 mg/dL — ABNORMAL HIGH (ref 8–23)
CO2: 30 mmol/L (ref 22–32)
Calcium: 9.1 mg/dL (ref 8.9–10.3)
Chloride: 101 mmol/L (ref 98–111)
Creatinine, Ser: 1.09 mg/dL — ABNORMAL HIGH (ref 0.44–1.00)
GFR, Estimated: 53 mL/min — ABNORMAL LOW (ref >60)
Glucose, Bld: 107 mg/dL — ABNORMAL HIGH (ref 70–99)
Potassium: 3.5 mmol/L (ref 3.5–5.1)
Sodium: 141 mmol/L (ref 135–145)
Total Bilirubin: 0.5 mg/dL (ref 0.0–1.2)
Total Protein: 7.2 g/dL (ref 6.5–8.1)

## 2023-06-22 LAB — CBC WITH DIFFERENTIAL/PLATELET
Abs Immature Granulocytes: 0.02 10*3/uL (ref 0.00–0.07)
Basophils Absolute: 0 10*3/uL (ref 0.0–0.1)
Basophils Relative: 1 %
Eosinophils Absolute: 0.1 10*3/uL (ref 0.0–0.5)
Eosinophils Relative: 3 %
HCT: 45.5 % (ref 36.0–46.0)
Hemoglobin: 14.4 g/dL (ref 12.0–15.0)
Immature Granulocytes: 0 %
Lymphocytes Relative: 24 %
Lymphs Abs: 1.1 10*3/uL (ref 0.7–4.0)
MCH: 28.5 pg (ref 26.0–34.0)
MCHC: 31.6 g/dL (ref 30.0–36.0)
MCV: 89.9 fL (ref 80.0–100.0)
Monocytes Absolute: 0.4 10*3/uL (ref 0.1–1.0)
Monocytes Relative: 8 %
Neutro Abs: 3.2 10*3/uL (ref 1.7–7.7)
Neutrophils Relative %: 64 %
Platelets: 150 10*3/uL (ref 150–400)
RBC: 5.06 MIL/uL (ref 3.87–5.11)
RDW: 15.5 % (ref 11.5–15.5)
WBC: 4.9 10*3/uL (ref 4.0–10.5)
nRBC: 0 % (ref 0.0–0.2)

## 2023-06-22 LAB — ETHANOL: Alcohol, Ethyl (B): <15 mg/dL (ref <15)

## 2023-06-22 LAB — RAPID URINE DRUG SCREEN, HOSP PERFORMED
Amphetamines: NOT DETECTED
Barbiturates: NOT DETECTED
Benzodiazepines: NOT DETECTED
Cocaine: NOT DETECTED
Opiates: NOT DETECTED
Tetrahydrocannabinol: NOT DETECTED

## 2023-06-22 LAB — SALICYLATE LEVEL: Salicylate Lvl: <7 mg/dL — ABNORMAL LOW (ref 7.0–30.0)

## 2023-06-22 LAB — ACETAMINOPHEN LEVEL: Acetaminophen (Tylenol), Serum: <10 ug/mL — ABNORMAL LOW (ref 10–30)

## 2023-06-22 MED ORDER — CIPROFLOXACIN HCL 500 MG PO TABS
500.0000 mg | ORAL_TABLET | Freq: Two times a day (BID) | ORAL | Status: DC
  Administered 2023-06-23: 500 mg via ORAL
  Filled 2023-06-22: qty 1

## 2023-06-22 MED ORDER — MEMANTINE HCL 10 MG PO TABS
10.0000 mg | ORAL_TABLET | Freq: Two times a day (BID) | ORAL | Status: DC
Start: 2023-06-22 — End: 2023-06-23
  Administered 2023-06-23: 10 mg via ORAL
  Filled 2023-06-22: qty 1

## 2023-06-22 MED ORDER — SERTRALINE HCL 50 MG PO TABS: 150.0000 mg | ORAL_TABLET | Freq: Every day | ORAL | Status: DC

## 2023-06-22 MED ORDER — CIPROFLOXACIN HCL 500 MG PO TABS: 500.0000 mg | ORAL_TABLET | Freq: Two times a day (BID) | ORAL | 0 refills | Status: AC

## 2023-06-22 MED ORDER — LEVETIRACETAM 500 MG PO TABS
1000.0000 mg | ORAL_TABLET | Freq: Two times a day (BID) | ORAL | Status: DC
  Administered 2023-06-23: 1000 mg via ORAL
  Filled 2023-06-22: qty 2

## 2023-06-22 MED ORDER — LORAZEPAM 1 MG PO TABS
1.0000 mg | ORAL_TABLET | Freq: Once | ORAL | Status: AC
  Administered 2023-06-22: 1 mg via ORAL
  Filled 2023-06-22: qty 1

## 2023-06-22 MED ORDER — RIVASTIGMINE TARTRATE 1.5 MG PO CAPS
3.0000 mg | ORAL_CAPSULE | Freq: Two times a day (BID) | ORAL | Status: DC
  Administered 2023-06-23: 3 mg via ORAL
  Filled 2023-06-22 (×3): qty 2

## 2023-06-22 MED ORDER — ROSUVASTATIN CALCIUM 20 MG PO TABS: 20.0000 mg | ORAL_TABLET | Freq: Every day | ORAL | Status: DC

## 2023-06-22 MED ORDER — AMLODIPINE BESYLATE 5 MG PO TABS
5.0000 mg | ORAL_TABLET | Freq: Every day | ORAL | Status: DC
Start: 2023-06-23 — End: 2023-06-23

## 2023-06-22 MED ORDER — LEVOTHYROXINE SODIUM 112 MCG PO TABS
112.0000 ug | ORAL_TABLET | Freq: Every day | ORAL | Status: DC
  Administered 2023-06-23: 112 ug via ORAL
  Filled 2023-06-22: qty 1

## 2023-06-22 NOTE — Discharge Instructions (Addendum)
 Follow up with your wife's doctor and/or therapist as soon as possible regarding today's ED visit.   Please follow up any other recommendations and clinic appointments provided by the psychiatry team that saw you in the Emergency Department.

## 2023-06-22 NOTE — ED Provider Notes (Signed)
 [place] EMERGENCY DEPARTMENT AT [facility]  Provider Note   CSN: [id number] Arrival date & time: 06/22/23  1928     History Chief Complaint  Patient presents with   Psychiatric Evaluation    Anne [name] is a 73 y.o. female.  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  Patient's husband at bedside reports that she has had increasing agitation and altered mental status for several days.  Patient was discharged this morning from [facility] after inability to have patient placed to the care facility due to her agitation.  She is currently being treated for a UTI and is not taking antibiotics as prescribed.  HPI     Home Medications Prior to Admission medications   Medication Sig Start Date End Date Taking? Authorizing Provider  acetaminophen  (TYLENOL ) 500 MG tablet Take 1,000 mg by mouth every 6 (six) hours as needed for moderate pain (pain score 4-6).    [provider]  amLODipine  (NORVASC ) 5 MG tablet Take 1 tablet by mouth once daily 11/15/22   Kent Pear, MD  ciprofloxacin  (CIPRO ) 500 MG tablet Take 1 tablet (500 mg total) by mouth 2 (two) times daily for 2 days. 06/23/23 06/25/23  Iver Marker, MD  divalproex  (DEPAKOTE ) 250 MG DR tablet Take 250 mg by mouth 2 (two) times daily. 05/27/23   [provider]  levETIRAcetam  (KEPPRA ) 1000 MG tablet Take 1 tablet (1,000 mg total) by mouth 2 (two) times daily. 03/08/23 03/02/24  Camara, Amadou, MD  levothyroxine  (SYNTHROID ) 112 MCG tablet Take 1 tablet (112 mcg total) by mouth daily before breakfast. 06/14/23   Bluford Burkitt, NP  lidocaine  (LIDODERM ) 5 % Place 1 patch onto the skin every 12 (twelve) hours. Remove & Discard patch within 12 hours or as directed by MD Patient not  taking: Reported on 06/16/2023 03/05/23 03/04/24  Suellyn Emory, MD  memantine  (NAMENDA ) 10 MG tablet Take 1 tablet (10 mg total) by mouth 2 (two) times daily. 05/10/23 08/08/23  Viviano Ground, MD  Midazolam  (NAYZILAM ) 5 MG/0.1ML SOLN Place 5 mg into the nose as needed. 03/08/23   Camara, Amadou, MD  Multiple Vitamin (MULTIVITAMIN) tablet Take 1 tablet by mouth daily.    [provider]  predniSONE  (STERAPRED UNI-PAK 21 TAB) 5 MG (21) TBPK tablet Take by mouth as directed. 06/14/23   [provider]  QUEtiapine  (SEROQUEL ) 50 MG tablet Take 1 tablet (50 mg total) by mouth at bedtime. 05/10/23 08/08/23  Viviano Ground, MD  rivastigmine  (EXELON ) 3 MG capsule Take 1 capsule (3 mg total) by mouth 2 (two) times daily. 11/14/22 11/09/23  Camara, Amadou, MD  rosuvastatin  (CRESTOR ) 20 MG tablet Take 1 tablet (20 mg total) by mouth daily. 05/23/22   Kent Pear, MD  sertraline  (ZOLOFT ) 50 MG tablet Take 3 tablets (150 mg total) by mouth daily. 05/10/23 08/08/23  Viviano Ground, MD      Allergies    Bee venom    Review of Systems   Review of Systems  Psychiatric/Behavioral:  Positive for agitation.   All other systems reviewed and are negative.   Physical Exam Updated Vital Signs BP (!) 142/84 (BP Location: Right Arm)   Pulse 91   Temp 98.1 F (36.7 C)   Resp 16   Ht 5\' 7"  (1.702 m)   Wt 66.7 kg   SpO2  96%   BMI 23.03 kg/m  Physical Exam Vitals and nursing note reviewed.  Constitutional:      General: She is not in acute distress.    Appearance: She is well-developed.  HENT:     Head: Normocephalic and atraumatic.  Eyes:     Conjunctiva/sclera: Conjunctivae normal.  Cardiovascular:     Rate and Rhythm: Normal rate and regular rhythm.     Heart sounds: No murmur heard. Pulmonary:     Effort: Pulmonary effort is normal. No respiratory distress.     Breath sounds: Normal breath sounds.  Abdominal:     Palpations: Abdomen is soft.     Tenderness: There is no abdominal  tenderness.  Musculoskeletal:        General: No swelling.     Cervical back: Neck supple.  Skin:    General: Skin is warm and dry.     Capillary Refill: Capillary refill takes less than 2 seconds.  Neurological:     General: No focal deficit present.     Mental Status: She is alert. Mental status is at baseline. She is disoriented.  Psychiatric:        Behavior: Behavior is agitated and aggressive.     Comments: Patient visibly agitated by any questions or interactions. Difficult to gauge current cognitive state due to aggression.     ED Results / Procedures / Treatments   Labs (all labs ordered are listed, but only abnormal results are displayed) Labs Reviewed  COMPREHENSIVE METABOLIC PANEL WITH GFR - Abnormal; Notable for the following components:      Result Value   Glucose, Bld 107 (*)    BUN 25 (*)    Creatinine, Ser 1.09 (*)    GFR, Estimated 53 (*)    All other components within normal limits  SALICYLATE LEVEL - Abnormal; Notable for the following components:   Salicylate Lvl <7.0 (*)    All other components within normal limits  ACETAMINOPHEN  LEVEL - Abnormal; Notable for the following components:   Acetaminophen  (Tylenol ), Serum <10 (*)    All other components within normal limits  URINALYSIS, ROUTINE W REFLEX MICROSCOPIC - Abnormal; Notable for the following components:   Leukocytes,Ua SMALL (*)    All other components within normal limits  ETHANOL  RAPID URINE DRUG SCREEN, HOSP PERFORMED  CBC WITH DIFFERENTIAL/PLATELET    EKG None  Radiology No results found.  Procedures Procedures    Medications Ordered in ED Medications  sertraline  (ZOLOFT ) tablet 150 mg (has no administration in time range)  rosuvastatin  (CRESTOR ) tablet 20 mg (has no administration in time range)  memantine  (NAMENDA ) tablet 10 mg (has no administration in time range)  levETIRAcetam  (KEPPRA ) tablet 1,000 mg (has no administration in time range)  levothyroxine  (SYNTHROID ) tablet  112 mcg (has no administration in time range)  amLODipine  (NORVASC ) tablet 5 mg (has no administration in time range)  rivastigmine  (EXELON ) capsule 3 mg (has no administration in time range)  ciprofloxacin  (CIPRO ) tablet 500 mg (has no administration in time range)  LORazepam  (ATIVAN ) tablet 1 mg (1 mg Oral Given 06/22/23 2125)    ED Course/ Medical Decision Making/ A&P                                 Medical Decision Making Amount and/or Complexity of Data Reviewed Labs: ordered.  Risk Prescription drug management.   This patient presents to the ED for concern of psychiatric  evaluation.  Differential diagnosis includes altered mental status, UTI, stroke, confusion, dementia   Lab Tests:  I Ordered, and personally interpreted labs.  The pertinent results include: CBC unremarkable, CMP with mild dehydration creatinine slightly elevated at 1.09 and GFR down to 53 but no evident AKI, UA with what appears to be resolving UTI with only small leukocytes seen but no bacteria or nitrites present.  All other labs are unremarkable or negative.   Medicines ordered and prescription drug management:  I ordered medication including Ativan  for agitation Reevaluation of the patient after these medicines showed that the patient improved I have reviewed the patients home medicines and have made adjustments as needed   Problem List / ED Course:  Patient with past history significant for dementia, nonalcoholic cirrhosis, lung cancer with metastatic spread to the bones in remission, hyperlipidemia, hypertension, seizures presents the emergency department today with concerns of agitation.  Husband at bedside reports that she has had increasing agitation for the last several days and was seen at [facility] and unfortunately discharged this morning after patient was unable to be placed into a nursing facility due to her agitation.  He reportedly tried modifying several home medications without any  improvement in symptoms. Physical exam was difficult to perform due to patient's confusion and agitation.  She is unwilling to answer very many questions and most of the history is coming from the patient's spouse.  She denies any area of pain and states that she would like to go home.  Unable to gauge how alert oriented patient is at she appears to be agitated even with me asking simple questions for her.  Patient has a history of being on Ativan  and her home dose of Ativan  was given to attempt to manage some of the agitation.  May require more aggressive interventions for chemical restraints if needed. Patient medically cleared. UA shows improvement with suspected infection likely from Ciprofloxacin  use. With patient medically cleared, placed into [facility] boarding status for SNF placement. There is no medical indication for admission at this time and patient appears to be beyond the ability of her husband to care for her at home. Home medications ordered included remaining 3 doses of Ciprofloxacin .   Final Clinical Impression(s) / ED Diagnoses Final diagnoses:  Dementia with aggressive behavior [facility])    Rx / DC Orders ED Discharge Orders     None         Concetta Dee, PA-C 06/22/23 2304    Sallyanne Creamer, DO 06/25/23 1512

## 2023-06-22 NOTE — ED Notes (Signed)
 Patient is ambulatory to the restroom with stand-by assistance.

## 2023-06-22 NOTE — ED Triage Notes (Signed)
 The pt had been at [facility] for 3 days until this am when the husband was called and told to come pick up his wife   the pt needs placement   and the husbsnd reports that the pt had been sleeping all day before she she was taken there

## 2023-06-22 NOTE — ED Notes (Signed)
 Staff helped pt to car driven by husband.  Pt's husband stated "you'd think all that money [facility] has they'd have more stuff for us  old people"  Medications and AVS reviewed with husband who verbalized understanding. Pt left ambulatory via POV

## 2023-06-22 NOTE — ED Provider Notes (Signed)
 Patient has been evaluated for placement options.  She is not able to be placed in facility, and appears by previous direction physician Dr. Martina Sledge that the patient has been evaluated for multiple venues.  At this time the patient will be discharged in the care of her husband  Vitals:   06/20/23 0924 06/21/23 1259  BP: 119/76 117/74  Pulse: 79 84  Resp: 16 14  Temp: 97.9 F (36.6 C) 98 F (36.7 C)  SpO2: 94% 96%    Hemodynamics are stable she is in no acute distress.  Will continue the patient on ciprofloxacin  for completion of UTI treatment.  Patient to follow-up with primary care.  TOC team as discussed and patient's husband will be taking home   Iver Marker, MD 06/22/23 1153

## 2023-06-22 NOTE — ED Notes (Signed)
Pt currently getting dressed.

## 2023-06-22 NOTE — ED Triage Notes (Signed)
 The pt came in c/o her x husband had taken her 2 youngest children and she did not want him to take the children  a friend brought her in

## 2023-06-22 NOTE — ED Notes (Signed)
 Spoke to pt's husband, he is in route to p/u pt to dc home.

## 2023-06-23 NOTE — ED Notes (Signed)
 Bed alarm placed under pt. Robbie Chiles RN also made aware that pt is a high fall risk and keeps attempting to get out of bed.

## 2023-06-23 NOTE — ED Notes (Signed)
 Pt arrived on unit cussing at staff and demanding that his wife be released immediately.  Husband continues to belittle staff and state that they are not receiving help because they are poor.  Staff assisted pt into wheelchair and assisted pt into car.  Pt discharged to husbands care at this time.

## 2023-06-23 NOTE — ED Provider Notes (Signed)
 Was informed by nursing and notes from case management which say that patient is to be discharged for outpatient PCP follow-up and family is coming to get her now.  Will change her to discharge for outpatient follow-up.    Vrishank Moster, Marine Sia, MD 06/23/23 [phone]

## 2023-06-23 NOTE — Progress Notes (Addendum)
 CSW spoke with patient's husband [name] to discuss barriers to placement. [name] states patient was "kicked out" of [facility] yesterday after being there for several days with medication changes. [name] states he picked patient up and brought her home before coming to [facility] for placement. [name] states he and patient own a home together but is trying to obtain POA from a private attorney to get the home sold to aide in placement. CSW thoroughly explained that patient's current insurance ([employer] and [facility]) do not provide memory care or long term care benefits. [name] states CSW is unwilling to help the patient due to her "being poor." Johnny states "Ellamae Gunnels wouldn't have been treated this way!" [name] states he will come get patient and take her home. CSW attempted to obtain clarity on preferred pharmacy for prescriptions to be sent to, Johnny states "fuck you bitch!" and he ended the call abruptly.  CSW spoke with RN to inform her of plan.  Shepard Dicker, MSW, LCSW Transitions of Care  Clinical Social Worker II [phone]

## 2023-06-28 ENCOUNTER — Telehealth: Payer: Self-pay | Admitting: Nurse Practitioner

## 2023-06-28 NOTE — Telephone Encounter (Signed)
 Copied from CRM [id number]. Topic: Medicare AWV >> Jun 27, 2023 12:39 PM Carney Christine wrote: Reason for CRM: Called patient to schedule Medicare Annual Wellness Visit (AWV). Left message for patient to call back and schedule Medicare Annual Wellness Visit (AWV).  Last date of AWV: 09/08/2020  Please schedule an AWVS appointment at any time with [facility] VISIT.  If any questions, please contact me at [phone].    Thank you,  [facility] Support [facility] Direct dial  [phone]

## 2023-07-08 ENCOUNTER — Telehealth: Payer: Self-pay

## 2023-07-08 NOTE — Telephone Encounter (Signed)
 [facility] evaluation form received needing provider signature form placed in provider to be signed folder.

## 2023-07-09 NOTE — Telephone Encounter (Signed)
 Form faxed to [phone] with a completed transmission log

## 2023-07-10 NOTE — Telephone Encounter (Signed)
 No call back number was provided unable to call back

## 2023-07-10 NOTE — Telephone Encounter (Signed)
 [facility] is calling back in regarding the evaluation form

## 2023-07-12 NOTE — Telephone Encounter (Signed)
 Copied from CRM [id number]. Topic: General - Other >> Jul 12, 2023 11:53 AM Dewanda Foots wrote: Reason for CRM: Merlyn Starring from [facility] was checking on the status of OT and PT form sent over on 6/2. Showing the form was signed but states we need to fax it back please.  Their fax is [phone]  States he also needs the insurance information as well please.  Thank you so much! [phone]

## 2023-07-12 NOTE — Telephone Encounter (Signed)
 Forms re faxed to the given fax number

## 2023-07-31 ENCOUNTER — Other Ambulatory Visit: Payer: Self-pay | Admitting: *Deleted

## 2023-07-31 NOTE — Patient Outreach (Signed)
 Phone call to patient spouse who confirmed that patient has been admitted to [facility] with plan to admitted to [facility] term care facility. Patient's spouse confirms plan to present there today to sign patient in and to pay deposit.  Patient to be closed at this time to chronic care management.  Patient to call this social worker with any additional questions or concerns.  Wendell Nicoson, LCSW [place]  [facility], [facility] Licensed Clinical Social Worker  Direct Dial: [phone]

## 2023-07-31 NOTE — Patient Instructions (Signed)
 Visit Information  Thank you for taking time to visit with me today. Please don't hesitate to contact me with any further questions or concerns.  Your next care management appointment is no further scheduled appointments.   Patient has currently been admitted to [facility] in [place] with plan to transition to long term care at [facility] today.  Please call the care guide team at [phone] if you need to cancel, schedule, or reschedule an appointment.    Trivia Heffelfinger, LCSW   [facility], [facility] Licensed Clinical Social Worker  Direct Dial: [phone]

## 2023-08-16 ENCOUNTER — Other Ambulatory Visit: Payer: Self-pay | Admitting: *Deleted

## 2023-08-16 DIAGNOSIS — C3411 Malignant neoplasm of upper lobe, right bronchus or lung: Secondary | ICD-10-CM

## 2023-08-21 ENCOUNTER — Telehealth: Payer: Self-pay | Admitting: *Deleted

## 2023-08-21 NOTE — Telephone Encounter (Signed)
 I called and spoke to [facility] and she says that they cannot treat this port her that for a way.  Appointment for the scan was the 17th and we do not have enough time to set this up for her to get 1 tomorrow.  Braulio says that there is a central [facility] that does CTs and it is right around the corner for where the patient is living.  The nurse for Dr. Melanee will be looking into getting the CT chest abdomen and pelvis- only IV contrast only.  When we can get it set up we need to call Braulio and her number is [phone]

## 2023-08-21 NOTE — Telephone Encounter (Signed)
 Anne [name] at [facility].  Telephone [phone]. The patient is here with dementia behavioral .  Anne knows that she is supposed to get CT scan but they would like to do it rather than bringing her all the way to [facility] to get the scan.  She wants someone to talk to them to see if they can change the CT scan to be done in [place].

## 2023-08-22 ENCOUNTER — Other Ambulatory Visit

## 2023-08-22 ENCOUNTER — Ambulatory Visit

## 2023-08-22 ENCOUNTER — Encounter

## 2023-08-22 NOTE — Telephone Encounter (Signed)
 Outbound call spoke to Hawkins in Centralized scheduling who stated they still have a lot of faxes coming in.  Provided contact information for Mylinda Louder at [facility] [phone] and indicated that would be the point of contact to schedule scans.  Reviewed documentation sent with Alan and she indicated they should have everything they need and will reach out to [facility] once processed.

## 2023-08-22 NOTE — Telephone Encounter (Signed)
 Outgoing call to [facility] scheduling to make sure no additional documentation is needed.

## 2023-08-22 NOTE — Telephone Encounter (Addendum)
 Per secure chat with Tawni Ill No auth required by this plan.  Fax sent to [facility]; confirmation received.

## 2023-08-22 NOTE — Telephone Encounter (Signed)
 Outbound call spoke w/ Anne [name] at [facility]; informed of below.  Advised if she does not hear from centralized scheduling to coordinate both ct c/a/p w/ contrast and nm whole body scan by the end of next week to contact [facility] centralized scheduling at [phone].  Also reviewed with Anne [name] no authorization was required for either of the scans.

## 2023-08-22 NOTE — Telephone Encounter (Addendum)
 Anne [name] contacted [facility] and they are able to perform the NM whole body scan and the CT c/a/p w/ contrast (needs to go through scheduling).  The fax number to send orders and authorization to is [phone].  Secure chat sent to [employer] inquiring if an authorization is required, waiting to hear back.  Per Juliet secure chat We will have to get authorization done here they do not have an authorization dept once done fax order/ referral to [phone] and they will call to schedule patient. They can do all above scans .

## 2023-08-23 ENCOUNTER — Telehealth: Payer: Self-pay

## 2023-08-23 DIAGNOSIS — C3411 Malignant neoplasm of upper lobe, right bronchus or lung: Secondary | ICD-10-CM

## 2023-08-23 NOTE — Telephone Encounter (Signed)
 Received fax from [facility] stating need new orders with new dates. We only take orders within 6 months time frame.  Also, orders don't have patient information on order.  It is also not electronically signed by the doctor.  Please send corrected orders so patient can be called and scheduled.  [facility] signed new order for CT c/a/p w/ contrast (order date > 6 months old); NM whole body scan ordered by Dr. Jacobo on 08/16/23.  Reprinted orders and refaxed to [facility]; fax confirmation received.

## 2023-08-23 NOTE — Telephone Encounter (Signed)
 Spoke to Centralized Scheduling who indicated that they did not receive the fax this morning (confirmation received); they indicated their systems were down.  Fax resent, another confirmation received.

## 2023-08-26 ENCOUNTER — Encounter: Payer: Self-pay | Admitting: Oncology

## 2023-08-26 NOTE — Telephone Encounter (Addendum)
 Spoke to Tammy at [facility]; indicated she submitted for review orders since NM whole body scan reads Signed by Jacobo while the CT c/a/p reads e-signed by [employer].  Representative asked why 2 orders signed by 2 providers; informed Dr. Melanee is out of clinic for the next 2 weeks and these providers are covering / assisting as needed.  Provided NPI #'s for both Dr. Jacobo and [facility] as well as contact info in the event additional information is needed.  Tammy inquired about last creatinine; informed was  indicated they would need a creatinine level 0.85 on 07/27/23.  [facility] needs a creatinine drawn within 30 days; was asked to fax standing order for creatinine so it can be drawn prior to scans being completed.

## 2023-08-26 NOTE — Telephone Encounter (Signed)
 Received fax stating the following sending confirmation that your patient has been contacted and is scheduled as shown above.  09/07/23 at 8am.  No further follow up needed at this time.

## 2023-08-26 NOTE — Telephone Encounter (Signed)
 Incoming call from representative indicating she will contact to schedule accordingly but is in need of demographics.  Informed would fax demographics along with insurance card and lab order shortly.

## 2023-08-26 NOTE — Addendum Note (Signed)
 Addended by: Lidia Clavijo on: 08/26/2023 09:44 AM   Modules accepted: Orders

## 2023-08-28 ENCOUNTER — Encounter: Admitting: Nurse Practitioner

## 2023-09-11 ENCOUNTER — Ambulatory Visit: Admitting: Oncology

## 2023-09-11 ENCOUNTER — Other Ambulatory Visit

## 2023-09-24 ENCOUNTER — Ambulatory Visit (INDEPENDENT_AMBULATORY_CARE_PROVIDER_SITE_OTHER): Payer: Self-pay | Admitting: Pharmacist

## 2023-09-24 ENCOUNTER — Other Ambulatory Visit: Payer: Self-pay | Admitting: Nurse Practitioner

## 2023-09-24 ENCOUNTER — Telehealth: Payer: Self-pay

## 2023-09-24 ENCOUNTER — Ambulatory Visit: Payer: PPO | Admitting: Neurology

## 2023-09-24 ENCOUNTER — Other Ambulatory Visit (HOSPITAL_COMMUNITY): Payer: Self-pay

## 2023-09-24 DIAGNOSIS — F03918 Unspecified dementia, unspecified severity, with other behavioral disturbance: Secondary | ICD-10-CM

## 2023-09-24 DIAGNOSIS — I10 Essential (primary) hypertension: Secondary | ICD-10-CM

## 2023-09-24 DIAGNOSIS — R413 Other amnesia: Secondary | ICD-10-CM

## 2023-09-24 MED ORDER — AMLODIPINE BESYLATE 5 MG PO TABS
5.0000 mg | ORAL_TABLET | Freq: Every day | ORAL | 0 refills | Status: DC
  Filled 2023-09-24 – 2023-09-26 (×2): qty 30, 30d supply, fill #0

## 2023-09-24 MED ORDER — AMLODIPINE BESYLATE 5 MG PO TABS: 5.0000 mg | ORAL_TABLET | Freq: Every day | ORAL | 0 refills | Status: DC

## 2023-09-24 NOTE — Progress Notes (Signed)
 09/24/2023 Name: Anne [name] MRN: [id number] DOB: January 15, 1951  Subjective  Chief Complaint  Patient presents with   Medication Reconcilliation    Care Team: Primary Care Provider: Gretel App, NP  Reason for visit: Anne [name] is a 73 y.o. female who was discharged today from SNF, [facility]. She was provided with a couple days worth of medication on discharge, though husband is understandably overwhelmed as medications are packaged in ~20 separate blister packs.   Husband reports they have a lot of mediation bottles remaining at home from prior to her hospitalization/SNF placement. Also has a copy of discharge med list form SNF facility.   SNF = [facility] [phone] Called 12/25 - Memory Care Coordinator line. No answer, mailbox full.   Medication Access/Adherence: ?  Prescription drug coverage: Payor: [employer] / Plan: [employer] PPO / Product Type: *No Product type* / .   - Reports that all medications are affordable: Relatively. Fixed income, all expenses are tough to manage. Denied for Medicaid previously due to possessions (car). - Medication packaging: None currently, open to pill packs - Patient manages their own medications/refills: Husband helps to manage  HPI: Patient does have their medications with then at the time of the visit. Has brought all old bottles from before hospitalization as well.   Patient confirms she was given her morning dose of medications today prior to discharge. This is consistent with the blister packs provided to her at discharge, empty AM dose for today.    SNF Discharge Med List: Psych/Neuro:  Memantine  10 mg twice daily Rivastigmine  3 mg twice daily Quetiapine  100 mg (was 50 mg prior to admission) Sertraline  50 mg (was 150 mg prior to admission - possibly reduced in the setting of increased quetiapine ) Trazodone  50 mg Divalproex  250 mg twice daily Zonisamide  100 mg twice daily  (stopped Keppra ) HTN: Amlodipine  5 mg HLD:  Atorvastatin 80 mg (changed from rosuvastatin  20 mg prior to admission) Endo: Levothyroxine  112 mcg   Summary of Med Changes from pre-/post-SNF Stopped:  Levetiracetam  stopped Started: Started on zonisamide  100 mg twice daily. Taking currently. Changed: Quetiapine  dose increased  Quetiapine  has been given every morning instead of evening. In morning pill pack form SNF.  Sertraline  dose reduced (150 mg to 50 mg daily)  SNF had also been compounding topical lorazepam  gel BID PRN agitation. Provided them with the remainder at discharge. 4 clicks twice daily PRN agitation  Outpatient Encounter Medications as of 09/24/2023  Medication Sig Note   acetaminophen  (TYLENOL ) 500 MG tablet Take 1,000 mg by mouth every 6 (six) hours as needed for moderate pain (pain score 4-6). (Patient taking differently: Take 325 mg by mouth every 6 (six) hours as needed for moderate pain (pain score 4-6).) 09/24/2023: Per assisted living facility, 325 mg 6q PRN   amLODipine  (NORVASC ) 5 MG tablet Take 1 tablet by mouth once daily    divalproex  (DEPAKOTE ) 250 MG DR tablet Take 250 mg by mouth 2 (two) times daily.    levothyroxine  (SYNTHROID ) 112 MCG tablet Take 1 tablet (112 mcg total) by mouth daily before breakfast.    memantine  (NAMENDA ) 10 MG tablet Take 1 tablet (10 mg total) by mouth 2 (two) times daily.    Midazolam  (NAYZILAM ) 5 MG/0.1ML SOLN Place 5 mg into the nose as needed. 04/04/2023: prn   QUEtiapine  (SEROQUEL ) 50 MG tablet Take 1 tablet (50 mg total) by mouth at bedtime. (Patient taking differently: Take 100 mg by mouth in the  morning.) 09/24/2023: Increased to 100 mg each morning per [facility]   rivastigmine  (EXELON ) 3 MG capsule Take 1 capsule (3 mg total) by mouth 2 (two) times daily.    rosuvastatin  (CRESTOR ) 20 MG tablet Take 1 tablet (20 mg total) by mouth daily. (Patient taking differently: Take 20 mg by mouth daily. Atorvastatin 80 mg daily  at [facility])    sertraline  (ZOLOFT ) 50 MG tablet Take 3 tablets (150 mg total) by mouth daily. (Patient taking differently: Take 50 mg by mouth daily.) 09/24/2023: Decreased to 50 mg daily in [facility]   Multiple Vitamin (MULTIVITAMIN) tablet Take 1 tablet by mouth daily.    predniSONE  (STERAPRED UNI-PAK 21 TAB) 5 MG (21) TBPK tablet Take by mouth as directed. (Patient not taking: Reported on 09/24/2023)    [DISCONTINUED] levETIRAcetam  (KEPPRA ) 1000 MG tablet Take 1 tablet (1,000 mg total) by mouth 2 (two) times daily. 09/24/2023: Not given at [facility]. Switched to zonisamide  100 mg BID   [DISCONTINUED] lidocaine  (LIDODERM ) 5 % Place 1 patch onto the skin every 12 (twelve) hours. Remove & Discard patch within 12 hours or as directed by MD (Patient not taking: Reported on 06/16/2023)    Facility-Administered Encounter Medications as of 09/24/2023  Medication   denosumab  (XGEVA ) injection 120 mg   heparin  lock flush 100 UNIT/ML injection   Assessment and Plan:   Medication Access All medications were reviewed with patient and husband today and medication list was updated as appropriate.  SNF supply from blister cards was transferred into standard AM/PM pillbox. SNF supply = through Thursday evening (except trazodone  only given 1 pill for tonight).   Follow up scheduled:  PCP 10/04/23 Neurology - Dr. Camara 11/14/23  Medication refills needed from PCP: Amlodipine  5 mg daily (previous prescriber = Sonnenberg)  Atorvastatin 80 mg vs rosuvastatin  20 mg/d (previous = rosuvastatin  20mg  Sonnenberg) Has ~20 tablets of rosuvastatin  20 mg that are non-expired. Per PCP, plan to transition back.   Medication refills needed from outside providers: Messaged Dr. Gregg as high priority staff message Memantine  Expired - Suzanne Kirsch, MD - EMERGENCY DEPT [name]: Camara, Amadou, MD  Quetiapine  Expired - Suzanne Kirsch, MD - EMERGENCY DEPT Sertraline  Expired - Suzanne Kirsch, MD - EMERGENCY DEPT Divalproex  - unclear if refills remaining - [name] Small supply remaining in home meds - non-expired  Refills remaining, transfer needed: Messaged [facility] to facilitate refills [facility], CPhT)  stopped at SNF Levothyroxine  112 mcg Cherilyn) None remaining per home supply (expired) Rivastigmine  3 mg (expires October - [name], Camara, Amadou, MD) Has small supply remaining in home meds - in date.   Should have refills for PRN midazolam  nasal spray. SNF provided them with a box of this as well.   ? Medication Name Instructions  Notes     Blood pressure   Amlodipine  5 mg 1 pill once daily  morning    Cholesterol / Heart Health   Rosuvastatin  20 mg 1 pill once daily  morning  Thyroid   Levothyroxine  112 mcg 1 pill each morning, 60 minutes before any food/drink  Morning Do not take with vitamins such as calcium , magnesium , multivitamin. Separate vitamins by 4 hours    Memory / Mood / Sleep  Memantine  10 mg 1 pill twice daily    Rivastigmine  3 mg 1 pill twice daily    Quetiapine  100 mg 1 pill once daily (morning)  Morning ([facility] memory care instructions)  Sertraline  50 mg 1 pill once  daily  morning  Trazodone  50 mg 1 pill once at bedtime  night    Seizure Prevention  Zonisamide  100 mg 1 pill twice daily    Divalproex  250 mg 1 pill twice daily                     Future Appointments  Date Time Provider Department Center  10/04/2023  4:00 PM Gretel App, NP [facility]  11/14/2023 11:15 AM Gregg Lek, MD [facility] None    Manuelita FABIENE Kobs, PharmD Clinical Pharmacist [facility] [phone]

## 2023-09-24 NOTE — Telephone Encounter (Signed)
 Patient and [name] husband arrived to office with a bag full of pill cards.  Reported that she has been discharged from [facility] due to her Medicaid being denied.  Leron Glance, NP present for discussion and including pharmacy team. Many of the medication cards include only 1 or pills.  Johnny reports that patient has pill bottles with medications in them at home.  Manuelita Kobs, pharmacist in office today and willing to help work through medications with patient and husband.  Husband has left office to get meds and will come back.  Sharene Silvan, CMA called Butler, daughter who is on the DPR to let her know that they were in the office.    Leron - please signed pended [phone] Referral for pharmacy.

## 2023-09-26 ENCOUNTER — Other Ambulatory Visit: Payer: Self-pay

## 2023-09-26 ENCOUNTER — Encounter: Payer: Self-pay | Admitting: Oncology

## 2023-09-26 ENCOUNTER — Other Ambulatory Visit (HOSPITAL_COMMUNITY): Payer: Self-pay

## 2023-09-27 ENCOUNTER — Other Ambulatory Visit: Payer: Self-pay

## 2023-09-27 ENCOUNTER — Ambulatory Visit: Admitting: Nurse Practitioner

## 2023-09-27 ENCOUNTER — Emergency Department
Admission: EM | Admit: 2023-09-27 | Discharge: 2023-09-27 | Discharge status: Against Medical Advice | Attending: Emergency Medicine | Admitting: Emergency Medicine

## 2023-09-27 ENCOUNTER — Other Ambulatory Visit (HOSPITAL_COMMUNITY): Payer: Self-pay

## 2023-09-27 VITALS — BP 120/66 | HR 74 | Temp 98.4°F | Ht 67.0 in | Wt 147.4 lb

## 2023-09-27 DIAGNOSIS — Z760 Encounter for issue of repeat prescription: Secondary | ICD-10-CM | POA: Diagnosis present

## 2023-09-27 DIAGNOSIS — R569 Unspecified convulsions: Secondary | ICD-10-CM | POA: Diagnosis not present

## 2023-09-27 DIAGNOSIS — H6123 Impacted cerumen, bilateral: Secondary | ICD-10-CM

## 2023-09-27 DIAGNOSIS — Z5321 Procedure and treatment not carried out due to patient leaving prior to being seen by health care provider: Secondary | ICD-10-CM | POA: Insufficient documentation

## 2023-09-27 DIAGNOSIS — E785 Hyperlipidemia, unspecified: Secondary | ICD-10-CM

## 2023-09-27 DIAGNOSIS — E032 Hypothyroidism due to medicaments and other exogenous substances: Secondary | ICD-10-CM

## 2023-09-27 DIAGNOSIS — F332 Major depressive disorder, recurrent severe without psychotic features: Secondary | ICD-10-CM

## 2023-09-27 DIAGNOSIS — F03918 Unspecified dementia, unspecified severity, with other behavioral disturbance: Secondary | ICD-10-CM | POA: Diagnosis not present

## 2023-09-27 DIAGNOSIS — I10 Essential (primary) hypertension: Secondary | ICD-10-CM

## 2023-09-27 MED ORDER — ZONISAMIDE 100 MG PO CAPS
100.0000 mg | ORAL_CAPSULE | Freq: Two times a day (BID) | ORAL | 1 refills | Status: AC
Start: 2023-09-27 — End: ?
  Filled 2023-09-27: qty 180, 90d supply, fill #0
  Filled 2023-09-27 – 2023-10-11 (×2): qty 60, 30d supply, fill #0
  Filled 2023-10-11: qty 180, 90d supply, fill #0
  Filled 2023-10-11: qty 60, 30d supply, fill #0
  Filled 2023-11-06 – 2023-11-08 (×2): qty 60, 30d supply, fill #1
  Filled 2023-11-08: qty 60, 30d supply, fill #0
  Filled 2023-12-09: qty 60, 30d supply, fill #1
  Filled 2023-12-09: qty 10, 5d supply, fill #1
  Filled 2023-12-10 (×2): qty 60, 30d supply, fill #1
  Filled 2024-01-01 – 2024-01-03 (×2): qty 60, 30d supply, fill #2
  Filled 2024-02-20: qty 60, 30d supply, fill #3
  Filled ????-??-??: fill #1

## 2023-09-27 MED ORDER — ROSUVASTATIN CALCIUM 20 MG PO TABS
20.0000 mg | ORAL_TABLET | Freq: Every day | ORAL | 3 refills | Status: AC
  Filled 2023-09-27: qty 90, 90d supply, fill #0
  Filled 2023-09-27: qty 30, 30d supply, fill #0
  Filled 2023-09-28: qty 90, 90d supply, fill #0
  Filled 2023-11-06 – 2023-12-09 (×3): qty 90, 90d supply, fill #1
  Filled 2023-12-10: qty 30, 30d supply, fill #1
  Filled 2024-01-01 – 2024-01-06 (×3): qty 30, 30d supply, fill #2
  Filled 2024-02-04: qty 30, 30d supply, fill #3
  Filled 2024-03-08: qty 30, 30d supply, fill #4

## 2023-09-27 MED ORDER — SERTRALINE HCL 100 MG PO TABS
100.0000 mg | ORAL_TABLET | Freq: Every day | ORAL | 3 refills | Status: AC
  Filled 2023-09-27: qty 90, 90d supply, fill #0
  Filled 2023-09-27: qty 30, 30d supply, fill #0
  Filled 2023-09-28: qty 90, 90d supply, fill #0
  Filled 2023-11-06 – 2023-12-09 (×4): qty 90, 90d supply, fill #1
  Filled 2023-12-10: qty 30, 30d supply, fill #1
  Filled 2024-01-01 – 2024-01-06 (×3): qty 30, 30d supply, fill #2
  Filled 2024-02-04: qty 30, 30d supply, fill #3
  Filled 2024-03-08: qty 30, 30d supply, fill #4

## 2023-09-27 MED ORDER — MEMANTINE HCL 10 MG PO TABS
10.0000 mg | ORAL_TABLET | Freq: Two times a day (BID) | ORAL | 1 refills | Status: DC
  Filled 2023-09-27: qty 60, 30d supply, fill #0
  Filled 2023-09-27: qty 180, 90d supply, fill #0
  Filled 2023-10-11 – 2023-10-14 (×3): qty 60, 30d supply, fill #0
  Filled 2023-11-06 – 2023-11-12 (×2): qty 60, 30d supply, fill #1

## 2023-09-27 MED ORDER — MEMANTINE HCL 10 MG PO TABS: 10.0000 mg | ORAL_TABLET | Freq: Two times a day (BID) | ORAL | 1 refills | Status: DC

## 2023-09-27 MED ORDER — QUETIAPINE FUMARATE 50 MG PO TABS
100.0000 mg | ORAL_TABLET | Freq: Every morning | ORAL | 1 refills | Status: AC
  Filled 2023-09-27: qty 60, 30d supply, fill #0
  Filled 2023-09-27 – 2023-09-28 (×2): qty 180, 90d supply, fill #0
  Filled 2023-11-06 – 2023-12-09 (×3): qty 180, 90d supply, fill #1
  Filled 2023-12-10: qty 60, 30d supply, fill #1
  Filled 2024-01-01 – 2024-01-06 (×3): qty 180, 90d supply, fill #1
  Filled 2024-02-04: qty 60, 30d supply, fill #2
  Filled 2024-03-08: qty 60, 30d supply, fill #3

## 2023-09-27 MED ORDER — TRAZODONE HCL 50 MG PO TABS
50.0000 mg | ORAL_TABLET | Freq: Every day | ORAL | 3 refills | Status: AC
  Filled 2023-09-27: qty 90, 90d supply, fill #0
  Filled 2023-09-27 – 2023-10-14 (×4): qty 30, 30d supply, fill #0
  Filled 2023-11-06 – 2023-11-12 (×2): qty 30, 30d supply, fill #1
  Filled 2023-12-09 – 2023-12-10 (×2): qty 30, 30d supply, fill #2
  Filled 2024-01-01 – 2024-01-06 (×3): qty 30, 30d supply, fill #3
  Filled 2024-02-04: qty 30, 30d supply, fill #4
  Filled 2024-03-08: qty 30, 30d supply, fill #5

## 2023-09-27 MED ORDER — DIVALPROEX SODIUM 250 MG PO DR TAB
250.0000 mg | DELAYED_RELEASE_TABLET | Freq: Two times a day (BID) | ORAL | 1 refills | Status: DC
  Filled 2023-09-27: qty 180, 90d supply, fill #0
  Filled 2023-09-27: qty 30, 15d supply, fill #0
  Filled 2023-10-11 (×3): qty 60, 30d supply, fill #0
  Filled 2023-10-11: qty 180, 90d supply, fill #0
  Filled 2023-11-06 – 2023-11-08 (×2): qty 60, 30d supply, fill #1
  Filled 2023-11-08: qty 60, 30d supply, fill #0
  Filled ????-??-??: fill #0
  Filled ????-??-??: fill #1

## 2023-09-27 NOTE — ED Triage Notes (Signed)
 Pt comes with needing medication refill. Pt saw her doctor today and meds were suppose to be delivered. Pt has not gotten meds yet and needs them to get through the night.

## 2023-09-27 NOTE — Progress Notes (Signed)
 Leron Glance, NP-C Phone: [phone]  Anne [name] is a 73 y.o. female who presents today for nursing home discharge follow up.   Discussed the use of AI scribe software for clinical note transcription with the patient, who gave verbal consent to proceed.  History of Present Illness   Anne [name] is a 73 year old female with dementia who presents with medication management issues and severe depression. She is accompanied by her husband, her caregiver.  She has been experiencing severe depression, expressing feelings of wishing she were dead and exhibiting agitation. Her antidepressant dosage was previously decreased during her stay at a memory care unit, and she has been on a low dose since. She has been home for two days, and her caregiver reports that she is severely depressed and resistant to care, often expressing confusion about her surroundings.  Her caregiver reports that she often does not recognize her home and expresses a desire to leave, sometimes asking for car keys despite being unable to drive. There has been no contact with social workers since her return home, and there are concerns about the adequacy of her previous memory care facility.  Regarding her medication management, there was a misunderstanding about the location of her medication refills, which are to be managed by [facility] in [place]. Her caregiver reports that she ran out of medication this morning and has not taken any today. The medications will be bubble packed for morning and evening doses to simplify administration.  Her caregiver also notes increased difficulty with hearing and would like to have her ears checked.      Social History   Tobacco Use  Smoking Status Former   Current packs/day: 0.00   Average packs/day: 2.0 packs/day for 50.0 years (100.0 ttl pk-yrs)   Types: Cigarettes, E-cigarettes   Start date: 02/06/1962   Quit date: 02/07/2012   Years since quitting: 11.6  Smokeless Tobacco  Never    Current Outpatient Medications on File Prior to Visit  Medication Sig Dispense Refill   acetaminophen  (TYLENOL ) 500 MG tablet Take 1,000 mg by mouth every 6 (six) hours as needed for moderate pain (pain score 4-6). (Patient taking differently: Take 325 mg by mouth every 6 (six) hours as needed for moderate pain (pain score 4-6).)     amLODipine  (NORVASC ) 5 MG tablet Take 1 tablet (5 mg total) by mouth daily. 30 tablet 0   levothyroxine  (SYNTHROID ) 112 MCG tablet Take 1 tablet (112 mcg total) by mouth daily before breakfast. 90 tablet 3   Midazolam  (NAYZILAM ) 5 MG/0.1ML SOLN Place 5 mg into the nose as needed. 2 each 2   Multiple Vitamin (MULTIVITAMIN) tablet Take 1 tablet by mouth daily.     rivastigmine  (EXELON ) 3 MG capsule Take 1 capsule (3 mg total) by mouth 2 (two) times daily. 60 capsule 11   Current Facility-Administered Medications on File Prior to Visit  Medication Dose Route Frequency Provider Last Rate Last Admin   denosumab  (XGEVA ) injection 120 mg  120 mg Subcutaneous Q30 days Melanee Annah BROCKS, MD   120 mg at 03/05/19 1525     ROS see history of present illness  Objective  Physical Exam Vitals:   09/27/23 1017  BP: 120/66  Pulse: 74  Temp: 98.4 F (36.9 C)  SpO2: 94%    BP Readings from Last 3 Encounters:  09/27/23 (!) 142/83  09/27/23 120/66  06/23/23 139/79   Wt Readings from Last 3 Encounters:  09/27/23 163 lb (73.9 kg)  09/27/23 147 lb 6.4 oz (66.9 kg)  06/22/23 147 lb 0.8 oz (66.7 kg)    Physical Exam Constitutional:      General: She is not in acute distress.    Appearance: Normal appearance.  HENT:     Head: Normocephalic.     Right Ear: There is impacted cerumen.     Left Ear: There is impacted cerumen.  Cardiovascular:     Rate and Rhythm: Normal rate and regular rhythm.     Heart sounds: Normal heart sounds.  Pulmonary:     Effort: Pulmonary effort is normal.     Breath sounds: Normal breath sounds.  Skin:    General: Skin is  warm and dry.  Neurological:     General: No focal deficit present.     Mental Status: She is alert.  Psychiatric:        Mood and Affect: Mood normal.        Behavior: Behavior normal.   Ear Cerumen Removal  Date/Time: 09/27/2023 10:37 AM  Performed by: Phillips, Latavia, CMA Authorized by: Gretel App, NP   Anesthesia: Local Anesthetic: none Location details: right ear and left ear Patient tolerance: patient tolerated the procedure well with no immediate complications Comments: TM visualized after removal- WNL. Procedure type: irrigation  Sedation: Patient sedated: no        Assessment/Plan: Please see individual problem list.  Bilateral impacted cerumen Assessment & Plan: Bilateral cerumen impaction is causing hearing difficulties. Ear irrigation performed, cerumen easily removed. Tolerated well. TM visualized after- WNL.   Orders: -     Ear Cerumen Removal  Dementia with behavioral disturbance [facility]) Assessment & Plan: She experiences progressive dementia with memory loss, disorientation and behavioral disturbances such as aggression and agitation. She has not had recent contact with a Child psychotherapist since discharge from memory care. Contact social worker, [place] for care coordination and potential facility placement. Ensure medications are delivered in bubble packs. Counseled on safety measures at home.   Orders: -     QUEtiapine  Fumarate; Take 2 tablets (100 mg total) by mouth in the morning.  Dispense: 180 tablet; Refill: 1 -     Memantine  HCl; Take 1 tablet (10 mg total) by mouth 2 (two) times daily.  Dispense: 180 tablet; Refill: 1 -     AMB Referral VBCI Care Management  Severe episode of recurrent major depressive disorder, without psychotic features (HCC) Assessment & Plan: Severe depression with suicidal ideation is present. Currently on Zoloft  50 mg daily. Counseling is unlikely to help due to advanced dementia. Increase Zoloft  to 100 mg daily at  bedtime. Safety measures in place. Strict precautions given to caregiver.   Orders: -     traZODone  HCl; Take 1 tablet (50 mg total) by mouth at bedtime.  Dispense: 90 tablet; Refill: 3 -     Sertraline  HCl; Take 1 tablet (100 mg total) by mouth at bedtime.  Dispense: 90 tablet; Refill: 3  Seizure [facility]) Assessment & Plan: Managed by Neurology. Refills provided on medication due to patient being out. Follow up with Neurology as scheduled for additional refills.   Orders: -     Divalproex  Sodium; Take 1 tablet (250 mg total) by mouth 2 (two) times daily.  Dispense: 180 tablet; Refill: 1 -     Zonisamide ; Take 1 capsule (100 mg total) by mouth 2 (two) times daily.  Dispense: 180 capsule; Refill: 1  Hyperlipidemia, unspecified hyperlipidemia type -     Rosuvastatin  Calcium ; Take 1 tablet (  20 mg total) by mouth daily.  Dispense: 90 tablet; Refill: 3     Return in about 3 months (around 12/28/2023) for Follow up.   Leron Glance, NP-C [facility]

## 2023-09-27 NOTE — ED Notes (Signed)
 PT with husband noted to out of ED. Husband stated she's leaving. NAD noted. Ambulatory with steady gait.

## 2023-09-28 ENCOUNTER — Other Ambulatory Visit (HOSPITAL_COMMUNITY): Payer: Self-pay

## 2023-09-30 ENCOUNTER — Other Ambulatory Visit: Payer: Self-pay

## 2023-09-30 ENCOUNTER — Other Ambulatory Visit (HOSPITAL_COMMUNITY): Payer: Self-pay

## 2023-10-01 ENCOUNTER — Other Ambulatory Visit: Payer: Self-pay

## 2023-10-01 ENCOUNTER — Telehealth: Payer: Self-pay

## 2023-10-01 NOTE — Telephone Encounter (Signed)
 Copied from CRM [id number]. Topic: General - Other >> Oct 01, 2023 11:05 AM Franky GRADE wrote: Reason for CRM: Dorn from [facility] is returning a call to Latavia, called [employer] and they asked to send a CRM. Dorn asked to follow patient prompt when calling as he is working off site today and will not receive the call if we follow provider prompt.

## 2023-10-02 ENCOUNTER — Other Ambulatory Visit: Payer: Self-pay

## 2023-10-03 NOTE — Telephone Encounter (Signed)
 Called to see if patient presented to appointment on 09/09/23 at 8am for CT c/a/p w/ contrast and whole body bone scan.  Spoke to scheduling who indicated that she never showed up.  They attempted to contact her to reschedule but have not received a call back as of yet.

## 2023-10-04 ENCOUNTER — Inpatient Hospital Stay: Admitting: Nurse Practitioner

## 2023-10-04 ENCOUNTER — Telehealth: Payer: Self-pay

## 2023-10-04 NOTE — Progress Notes (Signed)
 Complex Care Management Note Care Guide Note  10/04/2023 Name: Anne [name] MRN: [id number] DOB: 10/30/1950   Complex Care Management Outreach Attempts: An unsuccessful telephone outreach was attempted today to offer the patient information about available complex care management services.  Follow Up Plan:  Additional outreach attempts will be made to offer the patient complex care management information and services.   Encounter Outcome:  No Answer  Anne [name] [facility]  [facility], [facility] VBCI Assistant Direct Dial: [phone]  Fax: [phone]

## 2023-10-10 ENCOUNTER — Encounter: Payer: Self-pay | Admitting: Nurse Practitioner

## 2023-10-10 NOTE — Assessment & Plan Note (Signed)
 Bilateral cerumen impaction is causing hearing difficulties. Ear irrigation performed, cerumen easily removed. Tolerated well. TM visualized after- WNL.

## 2023-10-10 NOTE — Assessment & Plan Note (Signed)
 Severe depression with suicidal ideation is present. Currently on Zoloft  50 mg daily. Counseling is unlikely to help due to advanced dementia. Increase Zoloft  to 100 mg daily at bedtime. Safety measures in place. Strict precautions given to caregiver.

## 2023-10-10 NOTE — Assessment & Plan Note (Signed)
 Managed by Neurology. Refills provided on medication due to patient being out. Follow up with Neurology as scheduled for additional refills.

## 2023-10-10 NOTE — Assessment & Plan Note (Signed)
 She experiences progressive dementia with memory loss, disorientation and behavioral disturbances such as aggression and agitation. She has not had recent contact with a Child psychotherapist since discharge from memory care. Contact social worker, [place] for care coordination and potential facility placement. Ensure medications are delivered in bubble packs. Counseled on safety measures at home.

## 2023-10-11 ENCOUNTER — Other Ambulatory Visit: Payer: Self-pay

## 2023-10-11 ENCOUNTER — Encounter: Payer: Self-pay | Admitting: Oncology

## 2023-10-11 ENCOUNTER — Encounter: Payer: Self-pay | Admitting: Nurse Practitioner

## 2023-10-11 ENCOUNTER — Telehealth: Payer: Self-pay

## 2023-10-11 ENCOUNTER — Other Ambulatory Visit: Payer: Self-pay | Admitting: *Deleted

## 2023-10-11 ENCOUNTER — Other Ambulatory Visit (HOSPITAL_COMMUNITY): Payer: Self-pay

## 2023-10-11 DIAGNOSIS — E032 Hypothyroidism due to medicaments and other exogenous substances: Secondary | ICD-10-CM

## 2023-10-11 MED ORDER — LEVOTHYROXINE SODIUM 112 MCG PO TABS
112.0000 ug | ORAL_TABLET | Freq: Every day | ORAL | 3 refills | Status: AC
  Filled 2023-10-11: qty 30, 30d supply, fill #0
  Filled 2023-10-11: qty 90, 90d supply, fill #0
  Filled 2023-10-14: qty 30, 30d supply, fill #0
  Filled 2023-11-06 – 2023-11-12 (×2): qty 30, 30d supply, fill #1
  Filled 2023-12-09 – 2023-12-10 (×2): qty 30, 30d supply, fill #2
  Filled 2024-01-01 – 2024-01-06 (×3): qty 30, 30d supply, fill #3
  Filled 2024-02-04: qty 30, 30d supply, fill #4
  Filled 2024-03-08: qty 30, 30d supply, fill #5

## 2023-10-11 MED ORDER — ONE-DAILY MULTI VITAMINS PO TABS
1.0000 | ORAL_TABLET | Freq: Every day | ORAL | 3 refills | Status: AC
  Filled 2023-10-11: qty 90, 90d supply, fill #0
  Filled 2023-10-11: qty 30, 30d supply, fill #0
  Filled 2023-10-14: qty 90, 90d supply, fill #0
  Filled 2023-11-06: qty 90, 90d supply, fill #1
  Filled 2023-11-12: qty 30, 30d supply, fill #1
  Filled 2023-12-09 – 2023-12-10 (×2): qty 30, 30d supply, fill #2
  Filled 2024-01-01 – 2024-01-06 (×3): qty 30, 30d supply, fill #3
  Filled 2024-02-04: qty 30, 30d supply, fill #4
  Filled 2024-03-08: qty 30, 30d supply, fill #5

## 2023-10-11 NOTE — Telephone Encounter (Signed)
 Notified daughter that Patient spouse was requesting not office for palliative care.

## 2023-10-11 NOTE — Telephone Encounter (Signed)
 Pt and pt's husband came in today due to medications being missed. Sharene Silvan, CMA  called [facility]. Medication pick up and shipping where discussed. Both were informed that 2 medications Depakote  and Zonegran  could be picked up today. And Multi-vitamin, Synthroid  and Exelon , Trazdone could be picked up on 10/16/23.

## 2023-10-12 ENCOUNTER — Other Ambulatory Visit: Payer: Self-pay

## 2023-10-14 ENCOUNTER — Encounter: Payer: Self-pay | Admitting: Oncology

## 2023-10-14 ENCOUNTER — Other Ambulatory Visit (HOSPITAL_COMMUNITY): Payer: Self-pay

## 2023-10-14 ENCOUNTER — Other Ambulatory Visit: Payer: Self-pay

## 2023-10-15 ENCOUNTER — Other Ambulatory Visit (HOSPITAL_COMMUNITY): Payer: Self-pay

## 2023-10-15 ENCOUNTER — Encounter: Payer: Self-pay | Admitting: Nurse Practitioner

## 2023-10-15 ENCOUNTER — Encounter: Payer: Self-pay | Admitting: Oncology

## 2023-10-15 NOTE — Progress Notes (Signed)
 Complex Care Management Note  Care Guide Note 10/15/2023 Name: AUBRYNN KATONA MRN: [id number] DOB: 13-Apr-1950  AILANI GOVERNALE is a 73 y.o. year old female who sees Gretel App, NP for primary care. I reached out to Reygan E Narvaiz by phone today to offer complex care management services.  Ms. Williard was given information about Complex Care Management services today including:   The Complex Care Management services include support from the care team which includes your Nurse Care Manager, Clinical Social Worker, or Pharmacist.  The Complex Care Management team is here to help remove barriers to the health concerns and goals most important to you. Complex Care Management services are voluntary, and the patient may decline or stop services at any time by request to their care team member.   Complex Care Management Consent Status: Patient agreed to services and verbal consent obtained.   Follow up plan:  Telephone appointment with complex care management team member scheduled for:  10/23/23 at 11:00 a.m.   Encounter Outcome:  Patient Scheduled  Dreama Lynwood [facility]  [facility], [facility] VBCI Assistant Direct Dial: [phone]  Fax: [phone]

## 2023-10-16 ENCOUNTER — Other Ambulatory Visit: Payer: Self-pay

## 2023-10-16 ENCOUNTER — Telehealth: Payer: Self-pay | Admitting: *Deleted

## 2023-10-16 ENCOUNTER — Encounter: Payer: Self-pay | Admitting: Oncology

## 2023-10-16 NOTE — Patient Outreach (Signed)
 Phone call from patient's daughter Butler on 10/15/23 confirming that patient's spouse wasbrought her home from a memory care facility where she lived in [place] due to non-payment. Placement process discussed as well as payment responsibility.  Confirmed that the majority of patient's check along with Medicaid would be used to cover the cost of the placement.Patient's daughter would like to look in to a local memory care facility if possible and requested that a facility list be emailed to her for review. Facility list emailed to [email or web address].   Eshan Trupiano, LCSW [place]  [facility], [facility] Licensed Clinical Social Worker  Direct Dial: [phone]

## 2023-10-23 ENCOUNTER — Other Ambulatory Visit: Payer: Self-pay

## 2023-10-23 NOTE — Patient Outreach (Signed)
 LCSW called patient's spouse at scheduled and was unable to reach him. LCSW will send back to scheduling guide to reschedule.  Olam Ally, MSW, LCSW [place]  [facility], [facility] Licensed Clinical Social Worker Direct Dial: [phone]

## 2023-10-23 NOTE — Patient Instructions (Signed)
 Visit Information  Anne [name] - I am sorry I was unable to reach you today for our scheduled appointment. I work with Gretel App, NP and am calling to support your healthcare needs. Please contact me at [phone] at your earliest convenience. I look forward to speaking with you soon.   Thank you,  Olam Ally, MSW, LCSW [place]  [facility], [facility] Licensed Clinical Social Worker Direct Dial: [phone]    Please call the care guide team at [phone] if you need to cancel, schedule, or reschedule an appointment.

## 2023-10-24 ENCOUNTER — Other Ambulatory Visit (HOSPITAL_COMMUNITY): Payer: Self-pay

## 2023-10-24 ENCOUNTER — Other Ambulatory Visit: Payer: Self-pay

## 2023-10-24 ENCOUNTER — Telehealth: Payer: Self-pay | Admitting: Nurse Practitioner

## 2023-10-24 ENCOUNTER — Other Ambulatory Visit: Payer: Self-pay | Admitting: Nurse Practitioner

## 2023-10-24 DIAGNOSIS — I10 Essential (primary) hypertension: Secondary | ICD-10-CM

## 2023-10-24 MED ORDER — AMLODIPINE BESYLATE 5 MG PO TABS
5.0000 mg | ORAL_TABLET | Freq: Every day | ORAL | 3 refills | Status: DC
  Filled 2023-10-24: qty 30, 30d supply, fill #0
  Filled 2023-10-24: qty 21, 21d supply, fill #0
  Filled 2023-11-06: qty 21, 21d supply, fill #1
  Filled 2023-11-12: qty 30, 30d supply, fill #1
  Filled 2023-12-09 – 2023-12-10 (×2): qty 30, 30d supply, fill #2
  Filled 2024-01-01 – 2024-01-06 (×3): qty 30, 30d supply, fill #3

## 2023-10-25 ENCOUNTER — Other Ambulatory Visit: Payer: Self-pay

## 2023-11-06 ENCOUNTER — Other Ambulatory Visit: Payer: Self-pay

## 2023-11-07 ENCOUNTER — Telehealth: Admitting: *Deleted

## 2023-11-07 ENCOUNTER — Telehealth: Payer: Self-pay

## 2023-11-07 NOTE — Telephone Encounter (Signed)
 Error

## 2023-11-07 NOTE — Progress Notes (Addendum)
 Complex Care Management Care Guide Note  11/07/2023 Name: Anne [name] MRN: [id number] DOB: [birth year]  Anne [name] is a 73 y.o. year old female who is a primary care patient of Gretel App, NP and is actively engaged with the care management team. I reached out to Salam E Keast by phone today to assist with re-scheduling  with the Licensed Clinical Child psychotherapist.  Follow up plan: Telephone appointment with complex care management team member scheduled for:  11/12/23 at 3:00 p.m.   Dreama Lynwood [facility]  [facility], [facility] VBCI Assistant Direct Dial: [phone]  Fax: [phone]

## 2023-11-07 NOTE — Progress Notes (Signed)
 Complex Care Management Note Care Guide Note  11/07/2023 Name: Anne [name] MRN: [id number] DOB: 1950-10-19   Complex Care Management Outreach Attempts: An unsuccessful telephone outreach was attempted today to offer the patient information about available complex care management services.  Follow Up Plan:  Additional outreach attempts will be made to offer the patient complex care management information and services.   Encounter Outcome:  No Answer  Dreama Lynwood [facility]  [facility], [facility] VBCI Assistant Direct Dial: [phone]  Fax: [phone]

## 2023-11-08 ENCOUNTER — Other Ambulatory Visit: Payer: Self-pay

## 2023-11-12 ENCOUNTER — Telehealth: Payer: Self-pay | Admitting: *Deleted

## 2023-11-12 ENCOUNTER — Other Ambulatory Visit: Payer: Self-pay

## 2023-11-12 ENCOUNTER — Encounter: Payer: Self-pay | Admitting: Nurse Practitioner

## 2023-11-12 ENCOUNTER — Encounter: Payer: Self-pay | Admitting: *Deleted

## 2023-11-12 ENCOUNTER — Telehealth

## 2023-11-12 NOTE — Patient Instructions (Signed)
 Anne [name] - I am sorry I was unable to reach you today for our scheduled appointment. I work with Gretel App, NP and am calling to support your healthcare needs. Please contact me at [phone] at your earliest convenience. I look forward to speaking with you soon.   Thank you,    Yemariam Magar, LCSW [place]  [facility], [facility] Licensed Clinical Social Worker  Direct Dial: [phone]

## 2023-11-13 ENCOUNTER — Other Ambulatory Visit: Payer: Self-pay

## 2023-11-13 ENCOUNTER — Encounter: Payer: Self-pay | Admitting: Oncology

## 2023-11-14 ENCOUNTER — Other Ambulatory Visit: Payer: Self-pay

## 2023-11-14 ENCOUNTER — Ambulatory Visit: Payer: PPO | Admitting: Neurology

## 2023-11-14 ENCOUNTER — Encounter: Payer: Self-pay | Admitting: Neurology

## 2023-11-14 VITALS — BP 99/56 | HR 85 | Ht 67.0 in | Wt 155.5 lb

## 2023-11-14 DIAGNOSIS — F22 Delusional disorders: Secondary | ICD-10-CM

## 2023-11-14 DIAGNOSIS — F02C18 Dementia in other diseases classified elsewhere, severe, with other behavioral disturbance: Secondary | ICD-10-CM

## 2023-11-14 DIAGNOSIS — R569 Unspecified convulsions: Secondary | ICD-10-CM | POA: Diagnosis not present

## 2023-11-14 DIAGNOSIS — F03918 Unspecified dementia, unspecified severity, with other behavioral disturbance: Secondary | ICD-10-CM

## 2023-11-14 DIAGNOSIS — G301 Alzheimer's disease with late onset: Secondary | ICD-10-CM

## 2023-11-14 MED ORDER — DIVALPROEX SODIUM 250 MG PO DR TAB
250.0000 mg | DELAYED_RELEASE_TABLET | Freq: Two times a day (BID) | ORAL | 3 refills | Status: AC
  Filled 2023-11-14 – 2023-12-09 (×2): qty 180, 90d supply, fill #0
  Filled 2023-12-09: qty 10, 5d supply, fill #0
  Filled 2023-12-10 (×2): qty 60, 30d supply, fill #0
  Filled 2024-01-01 – 2024-01-03 (×2): qty 60, 30d supply, fill #1
  Filled 2024-02-20: qty 60, 30d supply, fill #2

## 2023-11-14 MED ORDER — RIVASTIGMINE TARTRATE 3 MG PO CAPS
3.0000 mg | ORAL_CAPSULE | Freq: Two times a day (BID) | ORAL | 3 refills | Status: AC
  Filled 2023-11-14 – 2023-12-09 (×2): qty 180, 90d supply, fill #0
  Filled 2023-12-10: qty 60, 30d supply, fill #0
  Filled 2024-01-01 – 2024-01-06 (×3): qty 60, 30d supply, fill #1
  Filled 2024-02-04: qty 60, 30d supply, fill #2
  Filled 2024-03-08: qty 60, 30d supply, fill #3

## 2023-11-14 MED ORDER — MEMANTINE HCL 10 MG PO TABS
10.0000 mg | ORAL_TABLET | Freq: Two times a day (BID) | ORAL | 3 refills | Status: AC
  Filled 2023-11-14 – 2023-12-09 (×2): qty 180, 90d supply, fill #0
  Filled 2023-12-10: qty 60, 30d supply, fill #0
  Filled 2024-01-01 – 2024-01-06 (×3): qty 60, 30d supply, fill #1
  Filled 2024-02-04: qty 60, 30d supply, fill #2
  Filled 2024-03-08: qty 60, 30d supply, fill #3

## 2023-11-14 NOTE — Patient Instructions (Signed)
 Continue current medications Continue follow-up PCP Return in 1 year and call sooner if worse

## 2023-11-14 NOTE — Progress Notes (Signed)
 [name]  PATIENT: Anne [name] DOB: 1950/03/11  REQUESTING CLINICIAN: [name], MD HISTORY FROM: Patient and husband  REASON FOR VISIT: Memory decline    HISTORICAL  CHIEF COMPLAINT: Dementia follow-up  INTERVAL HISTORY 11/14/2023 Discussed the use of AI scribe software for clinical note transcription with the patient, who gave verbal consent to proceed.  [name] is a 73 year old female with dementia who presents with worsening memory. She is accompanied by her husband.  She has been experiencing worsening memory issues, with significant impairment noted by her husband. She sometimes does not recognize her husband [name] and needs to be reminded of his identity. She requires reminders for daily activities such as showering and needs assistance with changing clothes. She is also incontinent and wears diapers. Her husband has guardianship and manages her medications. She has not had any recent falls. She has a rash on her arm that has improved with the application of hydrocortisone  to manage the itching.  She sleeps a lot during both the day and night. She has not had any recent falls, although she walks with a cane and is described as 'wobbly'. She has not been agitated recently, and her appetite is good, as she continues to eat regularly, including meals like burgers. However she had multiple visit to the ED for agitation, and suicide ideation.  Her husband mentions that he visited memory care facility but felt it was inadequate. He has been in contact with adult protective services regarding her care needs.    INTERVAL HISTORY 03/08/2023: Patient presents today for follow-up, she is accompanied by her husband.  Last visit visit was in October, at that time we discussed about her worsening dementia, need to use assistive device with ambulation.  Unfortunately on October 20 she did have a fall and had a subdural hematoma.  December 14 she had  another fall, workup indicated worsening of the hematoma.  On December 17 she presented to the hospital for seizures, patient was started on Keppra .  At that time discussion with neurosurgery plan was to treat the hematoma conservatively.  While she was admitted in rehab, patient did have another fall which resulted in worsening of the hematoma but again plain for treatment was conservatively.  Husband tells me since discharge from the hospital she has unfortunately she has not had any additional fall but she continued to walk without the use of assistive device. At discharge from the hospital, patient Seroquel  Bupropion  and Depakote  were discontinued. In term of her dementia, husband tells me that her memory is still poor, she is still very agitated and irritable at times.  She is compliant with her medications and still reports hallucinations.   INTERVAL HISTORY 11/14/2022:  Patient presents today for follow-up, he is accompanied by husband.  Last visit was in April, since then husband tells me that she is about the same.  She still have hallucinations, seeing her deceased parents.  Husband tells me at time, she will argue with him and telling him that we need to go home thinking that they live at her parents house.  She is also making reference to 2 dogs when in fact they have 1.   Her memory is still poor, she does have paranoid and delusions.  She has been having multiple falls, has a walker but does not use it.  Husband is also reporting sundowning and seasonal depression.  They are on the Seroquel  12.5 mg twice daily.  Husband feels the  Seroquel  is helpful.   INTERVAL HISTORY 05/09/2022:  Patient presents today for follow-up, she is accompanied by her husband.  Since last visit in January, husband reports patient has been doing better.  At 1 point She did have a UTI and at that time was very confused and her memory got worse.  Since the UTI has been treated she has been doing well.  She tolerates the  Seroquel  well, currently on 12.5 mg nightly but still of behavioral issues described as hallucinations, seeing things that are not there, she thinks that her daughter and grandchildren live in the house, talks about them all the time but they live in [place].  She is compliant with the rest of her medication including Exelon , Namenda  and Depakote .  She had follow-up with her PCP who referred patient to home health. No seizures since last visit.    INTERVAL HISTORY 02/19/2022:  Patient presents today for follow-up, she is accompanied by [facility].  He reports that memory is getting poor and worse.  She is compliant with the Exelon  1.5 mg twice daily but has not seen any improvement.  They are time that she still wants to leave the house but has not done so.  She is on Seroquel  12.5 mg nightly.  Johnny reports that sleep is good.  He is the only one who helps her; He does work however 3 times a week and there are times where patient is at home alone. There is also of a seizure a month ago. Seizures described as staring spell and being unresponsive. Johnny reports the day of the headaches, she complained of bad headaches. She has not had any additional events since leaving the hospital.    INTERVAL HISTORY 08/15/21 Patient presents today with husband for follow up worsening memory, and additional behavioral changes. She has been non compliant with her medications. Husband has noted that patient has been packing up the car thinking she is about ot move. She has been talking about her mother in [place] when her mother passed away.  Currently she is not  driving, last time she drove to [place] for a weddig when there was no wedding, husband has to call a silver  alert on her. At home, she does not cook, use the microwave for frozen dinner. Husband reports that she needs help with showeing and dressing. She misplaces items all the time.  They already have home health services that come once a week and there  are complaints of multiple falls.    HISTORY OF PRESENT ILLNESS:  This is a 73 year old woman with past medical history of lung cancer currently getting chemo every 3 to 4 weeks, with bone metastasis, depression (severe during wintertime) hypertension, hypothyroidism, and hyperlipidemia who is presenting with memory decline.  Patient stated that her memory is terrible, she forgets a lot of things.  She has issue with remote memory and also report forgetting recent conversations.  She also misplaces a lot of things.  She currently drives, denies any recent accident, denies being lost in familiar places.  She can handle her financial affairs by herself, denies being late on her bills.  She knows her family members well, do not confuse them with other people.  She has started Prevagen and reports some improvement with the medication.   Husband stated patient is more forgetful than before, and mainly misplace a lot of things.  He has to repeat himself multiple times because patient will keep on asking the same questions over and over.  She has not been wandering outside her house.  She is able to complete all activities of daily living, she is able to cook, clean, bathe and dress herself.     OTHER MEDICAL CONDITIONS: lung cancer chemo in remission but still undergoing treatment every 3 to 4 weeks, Depression (severe during the winter), HTN, Hypothyroidism, HLD   REVIEW OF SYSTEMS: Full 14 system review of systems performed and negative with exception of: as noted in the HPI   ALLERGIES: Allergies  Allergen Reactions   Bee Venom Swelling   Honey Bee Venom Other (See Comments)    Unknown rxn    HOME MEDICATIONS: Outpatient Medications Prior to Visit  Medication Sig Dispense Refill   acetaminophen  (TYLENOL ) 500 MG tablet Take 1,000 mg by mouth every 6 (six) hours as needed for moderate pain (pain score 4-6). (Patient taking differently: Take 325 mg by mouth every 6 (six) hours as needed for  moderate pain (pain score 4-6).)     amLODipine  (NORVASC ) 5 MG tablet Take 1 tablet (5 mg total) by mouth daily. 30 tablet 3   levothyroxine  (SYNTHROID ) 112 MCG tablet Take 1 tablet (112 mcg total) by mouth daily before breakfast. 90 tablet 3   Midazolam  (NAYZILAM ) 5 MG/0.1ML SOLN Place 5 mg into the nose as needed. 2 each 2   Multiple Vitamin (MULTIVITAMIN) tablet Take 1 tablet by mouth daily. 90 tablet 3   QUEtiapine  (SEROQUEL ) 50 MG tablet Take 2 tablets (100 mg total) by mouth in the morning. 180 tablet 1   rosuvastatin  (CRESTOR ) 20 MG tablet Take 1 tablet (20 mg total) by mouth daily. 90 tablet 3   sertraline  (ZOLOFT ) 100 MG tablet Take 1 tablet (100 mg total) by mouth at bedtime. 90 tablet 3   traZODone  (DESYREL ) 50 MG tablet Take 1 tablet (50 mg total) by mouth at bedtime. 90 tablet 3   zonisamide  (ZONEGRAN ) 100 MG capsule Take 1 capsule (100 mg total) by mouth 2 (two) times daily. 180 capsule 1   divalproex  (DEPAKOTE ) 250 MG DR tablet Take 1 tablet (250 mg total) by mouth 2 (two) times daily. 180 tablet 1   memantine  (NAMENDA ) 10 MG tablet Take 1 tablet (10 mg total) by mouth 2 (two) times daily. 180 tablet 1   rivastigmine  (EXELON ) 3 MG capsule Take 1 capsule (3 mg total) by mouth 2 (two) times daily. 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), in remission, 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   Metastatic bone cancer    Seizures (HCC)    Subdural hematoma (HCC)     PAST SURGICAL HISTORY: Past Surgical History:  Procedure  Laterality Date   APPENDECTOMY  1971   CHOLECYSTECTOMY  1971   COLONOSCOPY WITH PROPOFOL  N/A 11/15/2016   Procedure: COLONOSCOPY WITH PROPOFOL ;  Surgeon: Therisa Bi, MD;  Location: [facility] ENDOSCOPY;  Service: Gastroenterology;  Laterality: N/A;   ENDOBRONCHIAL ULTRASOUND Right 07/30/2018   Procedure: ENDOBRONCHIAL ULTRASOUND;  Surgeon: Verdia Art, MD;  Location: [facility] ORS;  Service: Pulmonary;  Laterality: Right;   ESOPHAGOGASTRODUODENOSCOPY (EGD) WITH PROPOFOL  N/A 01/07/2017   Procedure: ESOPHAGOGASTRODUODENOSCOPY (EGD) WITH PROPOFOL ;  Surgeon: Therisa Bi, MD;  Location: [facility] ENDOSCOPY;  Service: Gastroenterology;  Laterality: N/A;   LAPAROSCOPY N/A 03/01/2017   Procedure: LAPAROSCOPY DIAGNOSTIC;  Surgeon: Dessa Reyes ORN, MD;  Location: [facility] ORS;  Service: General;  Laterality: N/A;   PORTA CATH INSERTION N/A 08/14/2018   Procedure: PORTA CATH INSERTION;  Surgeon: Marea Selinda RAMAN, MD;  Location: [facility] INVASIVE CV LAB;  Service: Cardiovascular;  Laterality: N/A;   TONSILECTOMY, ADENOIDECTOMY, BILATERAL MYRINGOTOMY AND TUBES  1955   TONSILLECTOMY     VENTRAL HERNIA REPAIR N/A 03/01/2017   10 x 14 CM Ventralight ST mesh, intraperitoneal location.    VENTRAL HERNIA REPAIR N/A 03/01/2017   Procedure: HERNIA REPAIR VENTRAL ADULT;  Surgeon: Dessa Reyes ORN, MD;  Location: [facility] ORS;  Service: General;  Laterality: N/A;    FAMILY HISTORY: Family History  Problem Relation Age of Onset   Hypertension Mother    Ovarian cancer Mother 67   Dementia Mother    Heart disease Father    Stroke Father    Seizures Father    Ovarian cancer Sister        ? dx cancer had hyst.   Breast cancer Neg Hx     SOCIAL HISTORY: Social History   Socioeconomic History   Marital status: Divorced    Spouse name: Not on file   Number of children: 2   Years of education: Not on file   Highest education level: High school graduate  Occupational History   Not on file  Tobacco Use   Smoking status:  Former    Current packs/day: 0.00    Average packs/day: 2.0 packs/day for 50.0 years (100.0 ttl pk-yrs)    Types: Cigarettes, E-cigarettes    Start date: 02/06/1962    Quit date: 02/07/2012    Years since quitting: 11.7   Smokeless tobacco: Never  Vaping Use   Vaping status: Former   Start date: 10/06/2013   Devices: uses no liquid  Substance and Sexual Activity   Alcohol use: No    Alcohol/week: 0.0 standard drinks of alcohol   Drug use: No   Sexual activity: Not Currently  Other Topics Concern   Not on file  Social History Narrative   Married   Retired   Engineer, manufacturing systems level of education    No children    1 cup of coffee   Social Drivers of Corporate investment banker Strain: High Risk (06/23/2023)   Received from [facility]   Overall Financial Resource Strain (CARDIA)    Difficulty of Paying Living Expenses: Hard  Food Insecurity: No Food Insecurity (06/23/2023)   [facility] from [facility]   Hunger Vital Sign    Within the past 12 months, you worried that your food would run out before you got the money to buy more.: Never true    Within the past 12 months, the food you bought just didn't last and you didn't have money to get more.: Never true  Transportation Needs: No Transportation Needs (06/23/2023)   Received from [facility]   PRAPARE - Transportation    Lack of Transportation (Medical): No    Lack of Transportation (Non-Medical): No  Physical Activity: Inactive (02/22/2023)   Exercise Vital Sign    Days of Exercise per Week: 0 days    Minutes of Exercise per Session: 0 min  Stress: Patient Unable To Answer (02/22/2023)   [employer] [employer] - Occupational Stress Questionnaire    Feeling of Stress : Patient unable to answer  Social Connections: Moderately Isolated (02/20/2023)   Social Connection and Isolation Panel    Frequency of Communication with Friends and Family: Once a week    Frequency of Social Gatherings with Friends  and Family: More than three times a week    Attends Religious Services: Never    Database administrator or Organizations: No    Attends Banker Meetings: Never    Marital Status: Married  Catering manager Violence: Not At Risk (06/23/2023)   Received from [facility]   Humiliation, Afraid, Rape, and Kick questionnaire    Within the last year, have you been afraid of your partner or ex-partner?: No    Within the last year, have you been humiliated or emotionally abused in other ways by your partner or ex-partner?: No    Within the last year, have you been kicked, hit, slapped, or otherwise physically hurt by your partner or ex-partner?: No    Within the last year, have you been raped or forced to have any kind of sexual activity by your partner or ex-partner?: No     PHYSICAL EXAM  GENERAL EXAM/CONSTITUTIONAL: Vitals:  Vitals:   11/14/23 1100  BP: (!) 99/56  Pulse: 85  Weight: 155 lb 8 oz (70.5 kg)  Height: 5' 7 (1.702 m)    Body mass index is 24.35 kg/m. Wt Readings from Last 3 Encounters:  11/14/23 155 lb 8 oz (70.5 kg)  09/27/23 163 lb (73.9 kg)  09/27/23 147 lb 6.4 oz (66.9 kg)   Patient is in no distress; well developed, nourished and groomed; neck is supple  MUSCULOSKELETAL: Gait, strength, tone, movements noted in Neurologic exam below  NEUROLOGIC: MENTAL STATUS:     01/19/2021    8:32 AM 03/14/2020   10:47 AM 06/04/2017    9:34 AM  MMSE - Mini Mental State Exam  Orientation to time 4 4 5   Orientation to Place 5 5 5   Registration 3 3 3   Attention/ Calculation 2 5 5   Recall 0 0 0  Language- name 2 objects 2 2 2   Language- repeat 1 1 1   Language- follow 3 step command 3 2 3   Language- read & follow direction 1 1 1   Write a sentence 1 1 1   Copy design 1 1 1   Total score 23 25 27       02/19/2022    9:52 AM 08/15/2021    1:32 PM  Montreal Cognitive Assessment   Visuospatial/ Executive (0/5) 1 2  Naming (0/3) 1 2  Attention: Read list of  digits (0/2) 2 2  Attention: Read list of letters (0/1) 1 1  Attention: Serial 7 subtraction starting at 100 (0/3) 2 3  Language: Repeat phrase (0/2) 2 2  Language : Fluency (0/1) 0 0  Abstraction (0/2) 1 1  Delayed Recall (0/5) 0 0  Orientation (0/6) 2 3  Total 12 16  Adjusted Score (based on education) 12    Unable to perform MMSE or MOCA Does not interact with examiner  Able to answer yes and no  Was able to call her husband by his name but identified him as her ex-husband  Able to follow simple commands  Gait is unsteady and narrow.  Needs assistance with ambulation.     DIAGNOSTIC DATA (LABS, IMAGING, TESTING) - I reviewed patient  records, labs, notes, testing and imaging myself where available.  Lab Results  Component Value Date   WBC 4.9 06/22/2023   HGB 14.4 06/22/2023   HCT 45.5 06/22/2023   MCV 89.9 06/22/2023   PLT 150 06/22/2023      Component Value Date/Time   NA 141 06/22/2023 2131   NA 142 07/17/2021 1347   K 3.5 06/22/2023 2131   CL 101 06/22/2023 2131   CO2 30 06/22/2023 2131   GLUCOSE 107 (H) 06/22/2023 2131   BUN 25 (H) 06/22/2023 2131   BUN 12 07/17/2021 1347   CREATININE 1.09 (H) 06/22/2023 2131   CREATININE 0.92 04/22/2023 0934   CREATININE 1.09 02/26/2012 0925   CALCIUM  9.1 06/22/2023 2131   PROT 7.2 06/22/2023 2131   PROT 7.1 07/17/2021 1347   ALBUMIN 3.5 06/22/2023 2131   ALBUMIN 4.5 07/17/2021 1347   AST 30 06/22/2023 2131   AST 26 04/22/2023 0934   ALT 18 06/22/2023 2131   ALT 19 04/22/2023 0934   ALKPHOS 66 06/22/2023 2131   BILITOT 0.5 06/22/2023 2131   BILITOT 0.3 04/22/2023 0934   GFRNONAA 53 (L) 06/22/2023 2131   GFRNONAA >60 04/22/2023 0934   GFRNONAA 54 (L) 02/26/2012 0925   GFRAA >60 11/05/2019 2025   GFRAA >60 02/26/2012 0925   Lab Results  Component Value Date   CHOL 177 08/24/2020   HDL 54.90 08/24/2020   LDLCALC 82 08/24/2020   LDLDIRECT 95.0 07/22/2019   TRIG 198.0 (H) 08/24/2020   CHOLHDL 3 08/24/2020    Lab Results  Component Value Date   HGBA1C 6.1 09/12/2016   Lab Results  Component Value Date   VITAMINB12 1,235 (H) 10/01/2018   Lab Results  Component Value Date   TSH 0.90 06/07/2023    MRI Brain 08/2020 1. No evidence of intracranial metastasis. 2. Evidence of right mandibular head metastasis which is new from 2020 brain MRI and possibly new from a February 2022 bone scan.  Head CT 02/07/2022 1. No acute intracranial abnormality. 2. Chronic microvascular disease. 3. Mild sphenoid sinus and left posterior ethmoid air cell inflammation.  Head CT 11/25/2022 1. Approximately 6 mm thick mixed density left cerebral convexity subdural hematoma. 2. Smaller isodense 3 mm right subdural hemorrhage. 3. No significant midline shift.  CT Head 02/03/2023 1. Minimally increased size of the mixed attenuation left holo hemispheric subdural hematoma, now measuring up to 18 mm, previously 17 mm. Slightly increased rightward midline shift, now measuring up to 9 mm, previously 8 mm. 2. Slight left uncal herniation, new from prior.   CT Head 03/04/2023 1. Complex Left side Subdural Hematoma with mild additional progression since 02/03/2023. Mixed density, septated, and lobulated subdural collection now up to 24 mm maximum thickness (versus 18-19 mm last month). However, the subsequent intracranial mass effect and midline shift have not significantly changed. 2. No skull fracture or new intracranial abnormality identified   ASSESSMENT AND PLAN  73 y.o. year old female with history lung cancer on chemo with bone metastasis, depression (severe during wintertime) hypertension, hypothyroidism, and hyperlipidemia, dementia and now subdural hematoma who is presenting for follow up.   Worsening Dementia with progressive cognitive decline Severe dementia is worsening with significant memory loss and confusion. She sometimes does not recognize familiar people and places. Agitation is not currently  present but has presented numerous time to the hospital due to agitation. Guardianship is established, and memory care placement is under consideration. - Continue current medications  Gait instability Gait is unsteady, and  she uses a cane intermittently. No recent falls reported. Assistance is needed with some activities of daily living. - Encourage use of cane for stability  Urinary incontinence Urinary incontinence is present, and she is wearing diapers. There is a concern for potential urinary tract infections, which could exacerbate cognitive symptoms. - Monitor for signs of urinary tract infection - Consider primary care evaluation if symptoms of infection arise    1. 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 Capgras delusion (HCC)   3. Seizure (HCC)   4. Dementia with behavioral disturbance [facility])      Patient Instructions  Continue current medications Continue follow-up PCP Return in 1 year and call sooner if worse    No orders of the defined types were placed in this encounter.    Meds ordered this encounter  Medications   divalproex  (DEPAKOTE ) 250 MG DR tablet    Sig: Take 1 tablet (250 mg total) by mouth 2 (two) times daily.    Dispense:  180 tablet    Refill:  3    Please mail in pill packs!   memantine  (NAMENDA ) 10 MG tablet    Sig: Take 1 tablet (10 mg total) by mouth 2 (two) times daily.    Dispense:  180 tablet    Refill:  3    Please mail in pill packs!   rivastigmine  (EXELON ) 3 MG capsule    Sig: Take 1 capsule (3 mg total) by mouth 2 (two) times daily.    Dispense:  180 capsule    Refill:  3     Return in about 1 year (around 11/13/2024).  I have spent a total of 45 minutes dedicated to this patient today, preparing to see patient, performing a medically appropriate examination and evaluation, ordering tests and/or medications and procedures, and counseling and educating the patient/family/caregiver;  independently interpreting result and communicating results to the family/patient/caregiver; and documenting clinical information in the electronic medical record.   Pastor Falling, MD 11/14/2023, 1:50 PM  [facility] [address] [place], [name] [address] [phone]

## 2023-11-18 ENCOUNTER — Telehealth: Payer: Self-pay

## 2023-11-18 NOTE — Progress Notes (Unsigned)
 Complex Care Management Care Guide Note  11/18/2023 Name: Anne [name] MRN: [id number] DOB: 1951-01-12  Anne [name] is a 73 y.o. year old female who is a primary care patient of Gretel App, NP and is actively engaged with the care management team. I reached out to Bailee E Dykes by phone today to assist with re-scheduling  with the Licensed Clinical Child psychotherapist.  Follow up plan: Unsuccessful telephone outreach attempt made. A HIPAA compliant phone message was left for the patient providing contact information and requesting a return call.  [name] [facility], [facility] Guide  Direct Dial: [phone]  Fax [phone]

## 2023-11-19 NOTE — Progress Notes (Signed)
 Complex Care Management Care Guide Note  11/19/2023 Name: Anne [name] MRN: [id number] DOB: 1950-08-13  Anne [name] is a 73 y.o. year old female who is a primary care patient of Gretel App, NP and is actively engaged with the care management team. I reached out to [name] by phone today to assist with re-scheduling  with the Licensed Clinical Child psychotherapist.  Follow up plan: Unsuccessful telephone outreach attempt made. A HIPAA compliant phone message was left for the patient providing contact information and requesting a return call.  [name] [facility], [facility] Guide  Direct Dial: [phone]  Fax [phone]

## 2023-11-20 ENCOUNTER — Telehealth: Payer: Self-pay

## 2023-11-20 ENCOUNTER — Ambulatory Visit: Payer: Self-pay

## 2023-11-20 NOTE — Progress Notes (Signed)
 Complex Care Management Care Guide Note  11/20/2023 Name: [name] MRN: [id number] DOB: 1950/09/18  Anne [name] is a 73 y.o. year old female who is a primary care patient of Gretel App, NP and is actively engaged with the care management team. I reached out to Krissie E Fontenette by phone today to assist with re-scheduling  with the Licensed Clinical Child psychotherapist.  Follow up plan: Unsuccessful telephone outreach attempt made. A HIPAA compliant phone message was left for the patient providing contact information and requesting a return call.  [name] [facility], [facility] Guide  Direct Dial: [phone]  Fax [phone]

## 2023-11-20 NOTE — Telephone Encounter (Signed)
 Copied from CRM #[id number]. Topic: Clinical - Red Word Triage >> Nov 20, 2023 11:07 AM Shereese L wrote: [facility] that prompted transfer to Nurse Triage: patient has a itchy rash all over her body that might an allergic reaction to Reason for Disposition  SEVERE itching (i.e., interferes with sleep, normal activities or school)  Answer Assessment - Initial Assessment Questions 1. APPEARANCE of RASH: What does the rash look like? (e.g., blisters, dry flaky skin, red spots, redness, sores)     Looks like chickenpox per pts husband. Open areas where pt has been scratching on arms.  2. SIZE: How big are the spots? (e.g., tip of pen, eraser, coin; inches, centimeters)     A lot of small red dots  3. LOCATION: Where is the rash located?     All over/generalized. Not on neck or face.  4. COLOR: What color is the rash? (Note: It is difficult to assess rash color in people with darker-colored skin. When this situation occurs, simply ask the caller to describe what they see.)     Red  5. ONSET: When did the rash begin?     About 1 month ago  6. FEVER: Do you have a fever? If Yes, ask: What is your temperature, how was it measured, and when did it start?     No  7. ITCHING: Does the rash itch? If Yes, ask: How bad is the itch? (Scale 1-10; or mild, moderate, severe)     Moderate-severe. Has been using hydrocortisone  cream.  8. CAUSE: What do you think is causing the rash?     Pt just starting wearing depends a month ago for incontinence. SO think pt may be having a reaction to the depends pt has started wearing.  9. MEDICINE FACTORS: Have you started any new medicines within the last 2 weeks? (e.g., antibiotics)      No  10. OTHER SYMPTOMS: Do you have any other symptoms? (e.g., dizziness, headache, sore throat, joint pain)       Denies  Protocols used: Rash or Redness - [facility]

## 2023-11-21 ENCOUNTER — Encounter: Payer: Self-pay | Admitting: Family Medicine

## 2023-11-21 ENCOUNTER — Ambulatory Visit (INDEPENDENT_AMBULATORY_CARE_PROVIDER_SITE_OTHER): Admitting: Family Medicine

## 2023-11-21 ENCOUNTER — Ambulatory Visit: Payer: Self-pay | Admitting: Family Medicine

## 2023-11-21 ENCOUNTER — Other Ambulatory Visit: Payer: Self-pay

## 2023-11-21 VITALS — BP 92/62 | HR 67 | Temp 98.4°F | Ht 67.0 in | Wt 158.5 lb

## 2023-11-21 DIAGNOSIS — R21 Rash and other nonspecific skin eruption: Secondary | ICD-10-CM

## 2023-11-21 LAB — COMPREHENSIVE METABOLIC PANEL WITH GFR
ALT: 13 U/L (ref 0–35)
AST: 19 U/L (ref 0–37)
Albumin: 3.9 g/dL (ref 3.5–5.2)
Alkaline Phosphatase: 62 U/L (ref 39–117)
BUN: 21 mg/dL (ref 6–23)
CO2: 32 meq/L (ref 19–32)
Calcium: 9.3 mg/dL (ref 8.4–10.5)
Chloride: 107 meq/L (ref 96–112)
Creatinine, Ser: 0.78 mg/dL (ref 0.40–1.20)
GFR: 74.16 mL/min (ref >60.00)
Glucose, Bld: 83 mg/dL (ref 70–99)
Potassium: 3.9 meq/L (ref 3.5–5.1)
Sodium: 144 meq/L (ref 135–145)
Total Bilirubin: 0.3 mg/dL (ref 0.2–1.2)
Total Protein: 6.7 g/dL (ref 6.0–8.3)

## 2023-11-21 LAB — CBC WITH DIFFERENTIAL/PLATELET
Basophils Absolute: 0 K/uL (ref 0.0–0.1)
Basophils Relative: 0.9 % (ref 0.0–3.0)
Eosinophils Absolute: 0.2 K/uL (ref 0.0–0.7)
Eosinophils Relative: 4.7 % (ref 0.0–5.0)
HCT: 40.2 % (ref 36.0–46.0)
Hemoglobin: 12.9 g/dL (ref 12.0–15.0)
Lymphocytes Relative: 25.9 % (ref 12.0–46.0)
Lymphs Abs: 1.1 K/uL (ref 0.7–4.0)
MCHC: 32 g/dL (ref 30.0–36.0)
MCV: 89 fl (ref 78.0–100.0)
Monocytes Absolute: 0.3 K/uL (ref 0.1–1.0)
Monocytes Relative: 6.9 % (ref 3.0–12.0)
Neutro Abs: 2.5 K/uL (ref 1.4–7.7)
Neutrophils Relative %: 61.6 % (ref 43.0–77.0)
Platelets: 96 K/uL — ABNORMAL LOW (ref 150.0–400.0)
RBC: 4.52 Mil/uL (ref 3.87–5.11)
RDW: 17.1 % — ABNORMAL HIGH (ref 11.5–15.5)
WBC: 4.1 K/uL (ref 4.0–10.5)

## 2023-11-21 MED ORDER — PREDNISONE 10 MG PO TABS
ORAL_TABLET | ORAL | 0 refills | Status: AC
  Filled 2023-11-21: qty 15, 7d supply, fill #0

## 2023-11-21 MED ORDER — TRIAMCINOLONE ACETONIDE 0.5 % EX CREA
1.0000 | TOPICAL_CREAM | Freq: Two times a day (BID) | CUTANEOUS | 0 refills | Status: AC
  Filled 2023-11-21: qty 30, 15d supply, fill #0

## 2023-11-21 NOTE — Assessment & Plan Note (Signed)
 Acute, most likely allergic dermatitis but unclear etiology.  Will evaluate with labs to rule out new internal cause for itching.  No sign of bacterial or fungal infection. Will treat with prednisone  taper low-dose.  Can use triamcinolone 0.5% cream twice daily x 2 weeks on affected area. Start Zyrtec at bedtime. Call if any worsening or if not improving in the next 2 to 4 weeks can consider referral to dermatology.

## 2023-11-21 NOTE — Patient Instructions (Signed)
 Start Zyrtec at night.   Can use topical  triamcinolone on very itchy areas.  Complete prednisone  taper... take at once in AM each day.  Call if not improving in next several weeks.

## 2023-11-21 NOTE — Progress Notes (Signed)
 Patient ID: Anne [name], female    DOB: [birth year], 73 y.o.   MRN: [id number]  This visit was conducted in person.  BP 92/62   Pulse 67   Temp 98.4 F (36.9 C) (Oral)   Ht 5' 7 (1.702 m)   Wt 158 lb 8 oz (71.9 kg)   SpO2 92%   BMI 24.82 kg/m    CC:  Chief Complaint  Patient presents with   Rash    Pt here for Rash all over body. Accompanied by her Son. Son states they have used Hydrocortisone  and it doesn't seem to help. Also states the pt just started wearing briefs and she may be allergic to plastic    Subjective:   HPI: Anne [name] is a 73 y.o. female patient of Leron Glance with history of hypertension, nonalcoholic cirrhosis, metastatic lung cancer, hypothyroidism, dementia, seizure disorder presenting on 11/21/2023 for Rash (Pt here for Rash all over body./Accompanied by her Son. Son states they have used Hydrocortisone  and it doesn't seem to help. Also states the pt just started wearing briefs and she may be allergic to plastic)  [name] presents today with her  husband for new onset rash in last 1-2 weeks. Rash started on torso, spread to arms and legs.   New exposure: new diapers  No medication or supplements. No new lotions.      Relevant past medical, surgical, family and social history reviewed and updated as indicated. Interim medical history since our last visit reviewed. Allergies and medications reviewed and updated. Outpatient Medications Prior to Visit  Medication Sig Dispense Refill   acetaminophen  (TYLENOL ) 500 MG tablet Take 1,000 mg by mouth every 6 (six) hours as needed for moderate pain (pain score 4-6). (Patient taking differently: Take 325 mg by mouth every 6 (six) hours as needed for moderate pain (pain score 4-6).)     amLODipine  (NORVASC ) 5 MG tablet Take 1 tablet (5 mg total) by mouth daily. 30 tablet 3   divalproex  (DEPAKOTE ) 250 MG DR tablet Take 1 tablet (250 mg total) by mouth 2 (two) times daily. 180 tablet 3   levothyroxine   (SYNTHROID ) 112 MCG tablet Take 1 tablet (112 mcg total) by mouth daily before breakfast. 90 tablet 3   memantine  (NAMENDA ) 10 MG tablet Take 1 tablet (10 mg total) by mouth 2 (two) times daily. 180 tablet 3   Midazolam  (NAYZILAM ) 5 MG/0.1ML SOLN Place 5 mg into the nose as needed. 2 each 2   Multiple Vitamin (MULTIVITAMIN) tablet Take 1 tablet by mouth daily. 90 tablet 3   QUEtiapine  (SEROQUEL ) 50 MG tablet Take 2 tablets (100 mg total) by mouth in the morning. 180 tablet 1   rivastigmine  (EXELON ) 3 MG capsule Take 1 capsule (3 mg total) by mouth 2 (two) times daily. 180 capsule 3   rosuvastatin  (CRESTOR ) 20 MG tablet Take 1 tablet (20 mg total) by mouth daily. 90 tablet 3   sertraline  (ZOLOFT ) 100 MG tablet Take 1 tablet (100 mg total) by mouth at bedtime. 90 tablet 3   traZODone  (DESYREL ) 50 MG tablet Take 1 tablet (50 mg total) by mouth at bedtime. 90 tablet 3   zonisamide  (ZONEGRAN ) 100 MG capsule Take 1 capsule (100 mg total) by mouth 2 (two) times daily. 180 capsule 1   Facility-Administered Medications Prior to Visit  Medication Dose Route Frequency Provider Last Rate Last Admin   denosumab  (XGEVA ) injection 120 mg  120 mg Subcutaneous Q30 days Melanee Annah BROCKS, MD  120 mg at 03/05/19 1525     Per HPI unless specifically indicated in ROS section below Review of Systems  Constitutional:  Negative for fatigue and fever.  HENT:  Negative for congestion.   Eyes:  Negative for pain.  Respiratory:  Negative for cough and shortness of breath.   Cardiovascular:  Negative for chest pain, palpitations and leg swelling.  Gastrointestinal:  Negative for abdominal pain.  Genitourinary:  Negative for dysuria and vaginal bleeding.  Musculoskeletal:  Negative for back pain.  Skin:  Positive for rash.  Neurological:  Negative for syncope, light-headedness and headaches.  Psychiatric/Behavioral:  Negative for dysphoric mood.    Objective:  BP 92/62   Pulse 67   Temp 98.4 F (36.9 C) (Oral)    Ht 5' 7 (1.702 m)   Wt 158 lb 8 oz (71.9 kg)   SpO2 92%   BMI 24.82 kg/m   Wt Readings from Last 3 Encounters:  11/21/23 158 lb 8 oz (71.9 kg)  11/14/23 155 lb 8 oz (70.5 kg)  09/27/23 163 lb (73.9 kg)      Physical Exam Constitutional:      General: She is not in acute distress.    Appearance: Normal appearance. She is well-developed. She is not ill-appearing or toxic-appearing.     Comments: Pt minimally verbal, hard of hearing  HENT:     Head: Normocephalic.     Right Ear: Hearing, tympanic membrane, ear canal and external ear normal. Tympanic membrane is not erythematous, retracted or bulging.     Left Ear: Hearing, tympanic membrane, ear canal and external ear normal. Tympanic membrane is not erythematous, retracted or bulging.     Nose: No mucosal edema or rhinorrhea.     Right Sinus: No maxillary sinus tenderness or frontal sinus tenderness.     Left Sinus: No maxillary sinus tenderness or frontal sinus tenderness.     Mouth/Throat:     Pharynx: Uvula midline.  Eyes:     General: Lids are normal. Lids are everted, no foreign bodies appreciated.     Conjunctiva/sclera: Conjunctivae normal.     Pupils: Pupils are equal, round, and reactive to light.  Neck:     Thyroid : No thyroid  mass or thyromegaly.     Vascular: No carotid bruit.     Trachea: Trachea normal.  Cardiovascular:     Rate and Rhythm: Normal rate and regular rhythm.     Pulses: Normal pulses.     Heart sounds: Normal heart sounds, S1 normal and S2 normal. No murmur heard.    No friction rub. No gallop.  Pulmonary:     Effort: Pulmonary effort is normal. No tachypnea or respiratory distress.     Breath sounds: Normal breath sounds. No decreased breath sounds, wheezing, rhonchi or rales.  Abdominal:     General: Bowel sounds are normal.     Palpations: Abdomen is soft.     Tenderness: There is no abdominal tenderness.  Musculoskeletal:     Cervical back: Normal range of motion and neck supple.  Skin:     General: Skin is warm and dry.     Findings: Rash present.     Comments: See photo Rash across torso, back, bilateral extremeties  Neurological:     Mental Status: Mental status is at baseline. She is confused.  Psychiatric:        Mood and Affect: Mood is not anxious or depressed.        Speech: Speech normal.  Behavior: Behavior normal. Behavior is cooperative.        Thought Content: Thought content normal.        Judgment: Judgment normal.          Results for orders placed or performed during the hospital encounter of 06/22/23  Comprehensive metabolic panel   Collection Time: 06/22/23  9:31 PM  Result Value Ref Range   Sodium 141 135 - 145 mmol/L   Potassium 3.5 3.5 - 5.1 mmol/L   Chloride 101 98 - 111 mmol/L   CO2 30 22 - 32 mmol/L   Glucose, Bld 107 (H) 70 - 99 mg/dL   BUN 25 (H) 8 - 23 mg/dL   Creatinine, Ser 8.90 (H) 0.44 - 1.00 mg/dL   Calcium  9.1 8.9 - 10.3 mg/dL   Total Protein 7.2 6.5 - 8.1 g/dL   Albumin 3.5 3.5 - 5.0 g/dL   AST 30 15 - 41 U/L   ALT 18 0 - 44 U/L   Alkaline Phosphatase 66 38 - 126 U/L   Total Bilirubin 0.5 0.0 - 1.2 mg/dL   GFR, Estimated 53 (L) >60 mL/min   Anion gap 10 5 - 15  Ethanol   Collection Time: 06/22/23  9:31 PM  Result Value Ref Range   Alcohol, Ethyl (B) <15 <15 mg/dL  CBC with Diff   Collection Time: 06/22/23  9:31 PM  Result Value Ref Range   WBC 4.9 4.0 - 10.5 K/uL   RBC 5.06 3.87 - 5.11 MIL/uL   Hemoglobin 14.4 12.0 - 15.0 g/dL   HCT 54.4 63.9 - 53.9 %   MCV 89.9 80.0 - 100.0 fL   MCH 28.5 26.0 - 34.0 pg   MCHC 31.6 30.0 - 36.0 g/dL   RDW 84.4 88.4 - 84.4 %   Platelets 150 150 - 400 K/uL   nRBC 0.0 0.0 - 0.2 %   Neutrophils Relative % 64 %   Neutro Abs 3.2 1.7 - 7.7 K/uL   Lymphocytes Relative 24 %   Lymphs Abs 1.1 0.7 - 4.0 K/uL   Monocytes Relative 8 %   Monocytes Absolute 0.4 0.1 - 1.0 K/uL   Eosinophils Relative 3 %   Eosinophils Absolute 0.1 0.0 - 0.5 K/uL   Basophils Relative 1 %   Basophils  Absolute 0.0 0.0 - 0.1 K/uL   Immature Granulocytes 0 %   Abs Immature Granulocytes 0.02 0.00 - 0.07 K/uL  Salicylate level   Collection Time: 06/22/23  9:31 PM  Result Value Ref Range   Salicylate Lvl <7.0 (L) 7.0 - 30.0 mg/dL  Acetaminophen  level   Collection Time: 06/22/23  9:31 PM  Result Value Ref Range   Acetaminophen  (Tylenol ), Serum <10 (L) 10 - 30 ug/mL  Urine rapid drug screen (hosp performed)   Collection Time: 06/22/23  9:34 PM  Result Value Ref Range   Opiates NONE DETECTED NONE DETECTED   Cocaine NONE DETECTED NONE DETECTED   Benzodiazepines NONE DETECTED NONE DETECTED   Amphetamines NONE DETECTED NONE DETECTED   Tetrahydrocannabinol NONE DETECTED NONE DETECTED   Barbiturates NONE DETECTED NONE DETECTED  Urinalysis, Routine w reflex microscopic -Urine, Clean Catch   Collection Time: 06/22/23  9:34 PM  Result Value Ref Range   Color, Urine YELLOW YELLOW   APPearance CLEAR CLEAR   Specific Gravity, Urine 1.024 1.005 - 1.030   pH 5.0 5.0 - 8.0   Glucose, UA NEGATIVE NEGATIVE mg/dL   Hgb urine dipstick NEGATIVE NEGATIVE   Bilirubin Urine NEGATIVE NEGATIVE  Ketones, ur NEGATIVE NEGATIVE mg/dL   Protein, ur NEGATIVE NEGATIVE mg/dL   Nitrite NEGATIVE NEGATIVE   Leukocytes,Ua SMALL (A) NEGATIVE   RBC / HPF 0-5 0 - 5 RBC/hpf   WBC, UA 6-10 0 - 5 WBC/hpf   Bacteria, UA NONE SEEN NONE SEEN   Squamous Epithelial / HPF 0-5 0 - 5 /HPF   Mucus PRESENT    Hyaline Casts, UA PRESENT    *Note: Due to a large number of results and/or encounters for the requested time period, some results have not been displayed. A complete set of results can be found in Results Review.    Assessment and Plan  Rash Assessment & Plan: Acute, most likely allergic dermatitis but unclear etiology.  Will evaluate with labs to rule out new internal cause for itching.  No sign of bacterial or fungal infection. Will treat with prednisone  taper low-dose.  Can use triamcinolone 0.5% cream twice daily  x 2 weeks on affected area. Start Zyrtec at bedtime. Call if any worsening or if not improving in the next 2 to 4 weeks can consider referral to dermatology.  Orders: -     CBC with Differential/Platelet -     Comprehensive metabolic panel with GFR  Other orders -     predniSONE ; Take 3 tablets (30 mg total) by mouth daily for 3 days, THEN 2 tablets (20 mg total) daily for 2 days, THEN 1 tablet (10 mg total) daily for 2 days.  Dispense: 15 tablet; Refill: 0 -     Triamcinolone Acetonide; Apply 1 Application topically 2 (two) times daily.  Dispense: 30 g; Refill: 0    No follow-ups on file.   Greig Ring, MD

## 2023-11-28 ENCOUNTER — Telehealth: Payer: Self-pay

## 2023-11-28 NOTE — Telephone Encounter (Signed)
 Received a phone call back from Graig Moons at [facility].  Was asking for orders for orders for [facility] but unclear with what type of services she was requesting.  Let her know that I would call [facility] tomorrow to see what they need from us .  Let her know that patient's PCP was out of the office until next week.
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
## 2023-11-28 NOTE — Telephone Encounter (Signed)
 Attempted to call Graig Moons again. LMTCB.

## 2023-11-29 NOTE — Telephone Encounter (Signed)
 Called and spoke with [facility] to see if they had any information to share.  Spoke with Josette in referral department, no open/pending referrals or notes concerning new referrals.  Patient had previously been on palliative care but that admission has been closed out.  Will call Dee back and see if I can get anymore info.

## 2023-12-06 ENCOUNTER — Other Ambulatory Visit: Payer: Self-pay

## 2023-12-06 NOTE — Telephone Encounter (Signed)
 Graig Moons from [facility] returned my call.  I let her know that [facility] did not have an open referral nor do they take referrals to place patients.

## 2023-12-09 ENCOUNTER — Other Ambulatory Visit: Payer: Self-pay

## 2023-12-09 ENCOUNTER — Encounter: Payer: Self-pay | Admitting: Oncology

## 2023-12-09 ENCOUNTER — Other Ambulatory Visit (HOSPITAL_COMMUNITY): Payer: Self-pay

## 2023-12-10 ENCOUNTER — Encounter: Payer: Self-pay | Admitting: Oncology

## 2023-12-10 ENCOUNTER — Other Ambulatory Visit: Payer: Self-pay

## 2023-12-10 ENCOUNTER — Ambulatory Visit (INDEPENDENT_AMBULATORY_CARE_PROVIDER_SITE_OTHER): Admitting: Nurse Practitioner

## 2023-12-10 VITALS — BP 121/75 | HR 73 | Temp 97.9°F | Ht 67.0 in | Wt 155.0 lb

## 2023-12-10 DIAGNOSIS — R21 Rash and other nonspecific skin eruption: Secondary | ICD-10-CM

## 2023-12-10 MED ORDER — PERMETHRIN 5 % EX CREA
TOPICAL_CREAM | CUTANEOUS | 1 refills | Status: AC
  Filled 2023-12-10: qty 60, 30d supply, fill #0

## 2023-12-10 NOTE — Progress Notes (Unsigned)
 Leron Glance, NP-C Phone: [phone]  Anne [name] is a 73 y.o. female who presents today for rash.   Discussed the use of AI scribe software for clinical note transcription with the patient, who gave verbal consent to proceed.  History of Present Illness   Anne [name] is a 73 year old female who presents with a persistent rash. She is accompanied by her husband.  The rash has been present for a couple of weeks and was initially thought to be an allergic reaction to the plastic in her diapers. She was seen at [facility] where prednisone  was prescribed, but it did not improve her condition.  There is concern that the rash may be due to bed bugs or fleas, as the family dog sleeps with her, although the dog does not seem to be affected. The mattress is old, and there have been no visible signs of bugs. Hydrocortisone  has been used to alleviate itching, but it has not improved the rash itself. She has not tried Zyrtec or Benadryl  yet.  The family member also experiences some itching, raising further concern about a possible environmental cause.      Social History   Tobacco Use  Smoking Status Former   Current packs/day: 0.00   Average packs/day: 2.0 packs/day for 50.0 years (100.0 ttl pk-yrs)   Types: Cigarettes, E-cigarettes   Start date: 02/06/1962   Quit date: 02/07/2012   Years since quitting: 11.8  Smokeless Tobacco Never    Current Outpatient Medications on File Prior to Visit  Medication Sig Dispense Refill   acetaminophen  (TYLENOL ) 500 MG tablet Take 1,000 mg by mouth every 6 (six) hours as needed for moderate pain (pain score 4-6). (Patient taking differently: Take 325 mg by mouth every 6 (six) hours as needed for moderate pain (pain score 4-6).)     amLODipine  (NORVASC ) 5 MG tablet Take 1 tablet (5 mg total) by mouth daily. 30 tablet 3   divalproex  (DEPAKOTE ) 250 MG DR tablet Take 1 tablet (250 mg total) by mouth 2 (two) times daily. 180 tablet 3   levothyroxine   (SYNTHROID ) 112 MCG tablet Take 1 tablet (112 mcg total) by mouth daily before breakfast. 90 tablet 3   memantine  (NAMENDA ) 10 MG tablet Take 1 tablet (10 mg total) by mouth 2 (two) times daily. 180 tablet 3   Midazolam  (NAYZILAM ) 5 MG/0.1ML SOLN Place 5 mg into the nose as needed. 2 each 2   Multiple Vitamin (MULTIVITAMIN) tablet Take 1 tablet by mouth daily. 90 tablet 3   QUEtiapine  (SEROQUEL ) 50 MG tablet Take 2 tablets (100 mg total) by mouth in the morning. 180 tablet 1   rivastigmine  (EXELON ) 3 MG capsule Take 1 capsule (3 mg total) by mouth 2 (two) times daily. 180 capsule 3   rosuvastatin  (CRESTOR ) 20 MG tablet Take 1 tablet (20 mg total) by mouth daily. 90 tablet 3   sertraline  (ZOLOFT ) 100 MG tablet Take 1 tablet (100 mg total) by mouth at bedtime. 90 tablet 3   traZODone  (DESYREL ) 50 MG tablet Take 1 tablet (50 mg total) by mouth at bedtime. 90 tablet 3   triamcinolone cream (KENALOG) 0.5 % Apply 1 Application topically 2 (two) times daily. 30 g 0   zonisamide  (ZONEGRAN ) 100 MG capsule Take 1 capsule (100 mg total) by mouth 2 (two) times daily. 180 capsule 1   Current Facility-Administered Medications on File Prior to Visit  Medication Dose Route Frequency Provider Last Rate Last Admin   denosumab  (XGEVA ) injection  120 mg  120 mg Subcutaneous Q30 days Rao, Archana C, MD   120 mg at 03/05/19 1525     ROS see history of present illness  Objective  Physical Exam Vitals:   12/10/23 1340  BP: 121/75  Pulse: 73  Temp: 97.9 F (36.6 C)  SpO2: 96%    BP Readings from Last 3 Encounters:  12/10/23 121/75  11/21/23 92/62  11/14/23 (!) 99/56   Wt Readings from Last 3 Encounters:  12/10/23 155 lb (70.3 kg)  11/21/23 158 lb 8 oz (71.9 kg)  11/14/23 155 lb 8 oz (70.5 kg)    Physical Exam Constitutional:      General: She is not in acute distress.    Appearance: Normal appearance.  HENT:     Head: Normocephalic.  Skin:    General: Skin is warm and dry.     Findings: Rash  (covering majority of body- back, abdomen, legs, arms, neck. See pic below) present.     Comments: Excoriation from scratching noted. Patient visibly uncomfortable due to itching.   Neurological:     Mental Status: She is alert.  Psychiatric:        Mood and Affect: Mood normal.        Behavior: Behavior normal.      Assessment/Plan: Please see individual problem list.  Rash Assessment & Plan: She has a persistent rash and pruritus unresponsive to prednisone . The differential includes scabies, an allergic reaction, or bed bugs with a widespread distribution from neck to feet. Prescribe a topical cream for application from neck to feet, leave on for 8-12 hours, then wash off. Recommend Zyrtec or Benadryl  at bedtime for itching. Advise checking home for bed bugs or fleas, especially the mattress. Plan to refer to dermatology if no improvement.   Orders: -     Permethrin; Apply from the neck down to soles of feet, wash off after 8-12 hours.  Dispense: 60 g; Refill: 1      Return if symptoms worsen or fail to improve.   Leron Glance, NP-C [facility]

## 2023-12-11 ENCOUNTER — Other Ambulatory Visit: Payer: Self-pay

## 2023-12-11 ENCOUNTER — Encounter: Payer: Self-pay | Admitting: *Deleted

## 2023-12-11 ENCOUNTER — Telehealth: Payer: Self-pay | Admitting: *Deleted

## 2023-12-11 NOTE — Telephone Encounter (Signed)
 VM left to have call back  E2C2 IT IS OKAY TO RELAY THAT THE PROVIDER STATED THAT THEY NEED TO GIVE THE MEDICINE TIME TO WORK, THAT ITS A GOOD SIGN THAT THE ITCHING HAS EASED UP BUT GIVE IT TIME.

## 2023-12-11 NOTE — Telephone Encounter (Unsigned)
 Copied from CRM [id number]. Topic: Clinical - Medical Advice >> Dec 11, 2023  3:50 PM Burnard DEL wrote: Reason for CRM: Patient was prescribed  permethrin (ELIMITE) 5 % cream on yesterday for a rash. Husband called in today stating that she had the cream on for 8to 12 hours,and the just washed ot off,and the rash is still there. However the rash is no longer itching as bad as it was on yesterday. He would like to know what should they do?

## 2023-12-11 NOTE — Telephone Encounter (Signed)
2nd attempt to contact pt.

## 2023-12-12 ENCOUNTER — Other Ambulatory Visit: Payer: Self-pay

## 2023-12-12 ENCOUNTER — Encounter: Payer: Self-pay | Admitting: Nurse Practitioner

## 2023-12-12 NOTE — Telephone Encounter (Signed)
 Another attempt to contact E2C2 PLEASE JUST TRANSFER THE CALL TO THE OFFICE DO NOT DOA  CRM TRANSFER TO OFFICE

## 2023-12-12 NOTE — Telephone Encounter (Unsigned)
 Copied from CRM #[id number]. Topic: General - Other >> Dec 12, 2023 10:55 AM Lonell PEDLAR wrote: Reason for CRM: Patient's husband called stating that they missed phone call apt this morning. Please c/b

## 2023-12-12 NOTE — Telephone Encounter (Signed)
 Called back again-

## 2023-12-12 NOTE — Telephone Encounter (Signed)
3rd attempt to contact.

## 2023-12-12 NOTE — Telephone Encounter (Signed)
Spoke with husband and informed him

## 2023-12-12 NOTE — Assessment & Plan Note (Signed)
 She has a persistent rash and pruritus unresponsive to prednisone . The differential includes scabies, an allergic reaction, or bed bugs with a widespread distribution from neck to feet. Prescribe a topical cream for application from neck to feet, leave on for 8-12 hours, then wash off. Recommend Zyrtec or Benadryl  at bedtime for itching. Advise checking home for bed bugs or fleas, especially the mattress. Plan to refer to dermatology if no improvement.

## 2023-12-13 ENCOUNTER — Telehealth: Payer: Self-pay

## 2023-12-13 NOTE — Progress Notes (Unsigned)
 Complex Care Management Care Guide Note  12/13/2023 Name: Anne [name] MRN: [id number] DOB: [birth year]  Anne [name] is a 73 y.o. year old female who is a primary care patient of Gretel App, NP and is actively engaged with the care management team. I reached out to Taqwa E Lowden by phone today to assist with re-scheduling  with the Licensed Clinical Child Psychotherapist.  Follow up plan: Unsuccessful telephone outreach attempt made. A HIPAA compliant phone message was left for the patient providing contact information and requesting a return call.  [name] [facility], [facility] Guide  Direct Dial: [phone]  Fax [phone]

## 2023-12-19 ENCOUNTER — Other Ambulatory Visit: Payer: Self-pay

## 2023-12-19 ENCOUNTER — Emergency Department
Admission: EM | Admit: 2023-12-19 | Discharge: 2023-12-25 | Disposition: A | Discharge status: Home / Self Care | Attending: Emergency Medicine | Admitting: Emergency Medicine

## 2023-12-19 DIAGNOSIS — R456 Violent behavior: Secondary | ICD-10-CM | POA: Diagnosis not present

## 2023-12-19 DIAGNOSIS — R519 Headache, unspecified: Secondary | ICD-10-CM | POA: Diagnosis present

## 2023-12-19 DIAGNOSIS — R2689 Other abnormalities of gait and mobility: Secondary | ICD-10-CM | POA: Diagnosis not present

## 2023-12-19 DIAGNOSIS — Z79899 Other long term (current) drug therapy: Secondary | ICD-10-CM | POA: Diagnosis not present

## 2023-12-19 DIAGNOSIS — E785 Hyperlipidemia, unspecified: Secondary | ICD-10-CM | POA: Diagnosis not present

## 2023-12-19 DIAGNOSIS — F03918 Unspecified dementia, unspecified severity, with other behavioral disturbance: Secondary | ICD-10-CM | POA: Diagnosis not present

## 2023-12-19 DIAGNOSIS — F29 Unspecified psychosis not due to a substance or known physiological condition: Secondary | ICD-10-CM | POA: Diagnosis not present

## 2023-12-19 DIAGNOSIS — R451 Restlessness and agitation: Secondary | ICD-10-CM | POA: Insufficient documentation

## 2023-12-19 DIAGNOSIS — I10 Essential (primary) hypertension: Secondary | ICD-10-CM | POA: Diagnosis not present

## 2023-12-19 DIAGNOSIS — Z9181 History of falling: Secondary | ICD-10-CM | POA: Diagnosis not present

## 2023-12-19 LAB — CBC
HCT: 41.1 % (ref 36.0–46.0)
Hemoglobin: 13 g/dL (ref 12.0–15.0)
MCH: 29.1 pg (ref 26.0–34.0)
MCHC: 31.6 g/dL (ref 30.0–36.0)
MCV: 92.2 fL (ref 80.0–100.0)
Platelets: 149 K/uL — ABNORMAL LOW (ref 150–400)
RBC: 4.46 MIL/uL (ref 3.87–5.11)
RDW: 16 % — ABNORMAL HIGH (ref 11.5–15.5)
WBC: 4.3 K/uL (ref 4.0–10.5)
nRBC: 0 % (ref 0.0–0.2)

## 2023-12-19 LAB — COMPREHENSIVE METABOLIC PANEL WITH GFR
ALT: 13 U/L (ref 0–44)
AST: 23 U/L (ref 15–41)
Albumin: 3.8 g/dL (ref 3.5–5.0)
Alkaline Phosphatase: 76 U/L (ref 38–126)
Anion gap: 9 (ref 5–15)
BUN: 20 mg/dL (ref 8–23)
CO2: 29 mmol/L (ref 22–32)
Calcium: 9.3 mg/dL (ref 8.9–10.3)
Chloride: 105 mmol/L (ref 98–111)
Creatinine, Ser: 0.89 mg/dL (ref 0.44–1.00)
GFR, Estimated: >60 mL/min (ref >60)
Glucose, Bld: 107 mg/dL — ABNORMAL HIGH (ref 70–99)
Potassium: 3.7 mmol/L (ref 3.5–5.1)
Sodium: 142 mmol/L (ref 135–145)
Total Bilirubin: 0.3 mg/dL (ref 0.0–1.2)
Total Protein: 6.6 g/dL (ref 6.5–8.1)

## 2023-12-19 LAB — ETHANOL: Alcohol, Ethyl (B): <15 mg/dL (ref <15)

## 2023-12-19 NOTE — BH Assessment (Signed)
 This writer attempted to complete an assessment; however, assessment could not be completed as the patient was unable to answer questions, likely related to auditory impairment.  Iris consult currently pending.

## 2023-12-19 NOTE — ED Notes (Signed)
 Pt dressed out:  Red shoes Red shirt Green jacket Tan pants Glasses - kept at bedside Brief on
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
## 2023-12-19 NOTE — ED Provider Notes (Signed)
   [facility] Provider Note    Event Date/Time   First MD Initiated Contact with Patient 12/19/23 2014     (approximate)  History   Chief Complaint: Psychiatric Evaluation  HPI  Anne [name] is a 73 y.o. female with a past medical history of dementia, gastric reflux, hypertension, hyperlipidemia, presents to the emergency department for agitation.  Patient has baseline dementia, known to several staff members in the emergency department.  Patient lives with her husband, per EMS tonight the patient became very agitated and combative necessitating him to call EMS and police were on scene as well.  They brought the patient to the emergency department for medical evaluation.  Here the patient is calm cooperative she has no complaints.  Physical Exam   Triage Vital Signs: ED Triage Vitals [12/19/23 2011]  Encounter Vitals Group     BP 109/74     Girls Systolic BP Percentile      Girls Diastolic BP Percentile      Boys Systolic BP Percentile      Boys Diastolic BP Percentile      Pulse Rate 73     Resp 16     Temp 98 F (36.7 C)     Temp Source Oral     SpO2 98 %     Weight 154 lb 5.2 oz (70 kg)     Height      Head Circumference      Peak Flow      Pain Score      Pain Loc      Pain Education      Exclude from Growth Chart     Most recent vital signs: Vitals:   12/19/23 2011  BP: 109/74  Pulse: 73  Resp: 16  Temp: 98 F (36.7 C)  SpO2: 98%    General: Awake, no distress.  CV:  Good peripheral perfusion.  Regular rate and rhythm  Resp:  Normal effort.  Equal breath sounds bilaterally.  Abd:  No distention.  Soft, nontender.  No rebound or guarding.  ED Results / Procedures / Treatments   MEDICATIONS ORDERED IN ED: Medications - No data to display   IMPRESSION / MDM / ASSESSMENT AND PLAN / ED COURSE  I reviewed the triage vital signs and the nursing notes.  Patient's presentation is most consistent with severe exacerbation of  chronic illness.  Patient presents emergency department for acute onset of agitation.  Patient has a history of dementia with intermittent agitation/combative behavior.  We will check labs we will closely monitor in the emergency department have psychiatry and TTS evaluate.  However I strongly believe the patient's agitation/combative behavior is likely due to her underlying dementia.  She is currently calm and cooperative.  Will allow the patient to rest in the emergency department overnight and hopefully we could discharge tomorrow if psychiatrically cleared.  Patient's workup shows a reassuring CBC reassuring chemistry negative alcohol.  Currently awaiting psychiatric evaluation and disposition.  FINAL CLINICAL IMPRESSION(S) / ED DIAGNOSES   Agitation Dementia   Note:  This document was prepared using Dragon voice recognition software and may include unintentional dictation errors.   Dorothyann Drivers, MD 12/19/23 [phone]

## 2023-12-20 ENCOUNTER — Emergency Department

## 2023-12-20 DIAGNOSIS — F03918 Unspecified dementia, unspecified severity, with other behavioral disturbance: Secondary | ICD-10-CM

## 2023-12-20 DIAGNOSIS — R451 Restlessness and agitation: Secondary | ICD-10-CM | POA: Diagnosis not present

## 2023-12-20 LAB — VALPROIC ACID LEVEL: Valproic Acid Lvl: 34 ug/mL — ABNORMAL LOW (ref 50–100)

## 2023-12-20 MED ORDER — ZONISAMIDE 100 MG PO CAPS
100.0000 mg | ORAL_CAPSULE | Freq: Two times a day (BID) | ORAL | Status: DC
  Administered 2023-12-20 – 2023-12-25 (×9): 100 mg via ORAL
  Filled 2023-12-20 (×12): qty 1

## 2023-12-20 MED ORDER — QUETIAPINE FUMARATE 25 MG PO TABS
100.0000 mg | ORAL_TABLET | Freq: Every morning | ORAL | Status: DC
  Administered 2023-12-21 – 2023-12-23 (×3): 100 mg via ORAL
  Filled 2023-12-20 (×3): qty 4

## 2023-12-20 MED ORDER — MEMANTINE HCL 5 MG PO TABS
10.0000 mg | ORAL_TABLET | Freq: Two times a day (BID) | ORAL | Status: DC
  Administered 2023-12-20 – 2023-12-25 (×10): 10 mg via ORAL
  Filled 2023-12-20 (×10): qty 2

## 2023-12-20 MED ORDER — TRAZODONE HCL 50 MG PO TABS
50.0000 mg | ORAL_TABLET | Freq: Every day | ORAL | Status: DC
  Administered 2023-12-20 – 2023-12-25 (×5): 50 mg via ORAL
  Filled 2023-12-20 (×5): qty 1

## 2023-12-20 MED ORDER — LEVOTHYROXINE SODIUM 112 MCG PO TABS
112.0000 ug | ORAL_TABLET | Freq: Every day | ORAL | Status: DC
  Administered 2023-12-21 – 2023-12-25 (×5): 112 ug via ORAL
  Filled 2023-12-20 (×6): qty 1

## 2023-12-20 MED ORDER — AMLODIPINE BESYLATE 5 MG PO TABS
5.0000 mg | ORAL_TABLET | Freq: Every day | ORAL | Status: DC
  Administered 2023-12-21 – 2023-12-25 (×5): 5 mg via ORAL
  Filled 2023-12-20 (×5): qty 1

## 2023-12-20 MED ORDER — OLANZAPINE 10 MG IM SOLR
5.0000 mg | Freq: Once | INTRAMUSCULAR | Status: DC
  Filled 2023-12-20 (×2): qty 10

## 2023-12-20 MED ORDER — DIVALPROEX SODIUM 250 MG PO DR TAB
250.0000 mg | DELAYED_RELEASE_TABLET | Freq: Two times a day (BID) | ORAL | Status: DC
  Administered 2023-12-20 – 2023-12-25 (×10): 250 mg via ORAL
  Filled 2023-12-20 (×10): qty 1

## 2023-12-20 MED ORDER — SERTRALINE HCL 50 MG PO TABS
100.0000 mg | ORAL_TABLET | Freq: Every day | ORAL | Status: DC
  Administered 2023-12-20 – 2023-12-25 (×5): 100 mg via ORAL
  Filled 2023-12-20 (×5): qty 2

## 2023-12-20 MED ORDER — RIVASTIGMINE TARTRATE 3 MG PO CAPS
3.0000 mg | ORAL_CAPSULE | Freq: Two times a day (BID) | ORAL | Status: DC
  Administered 2023-12-20 – 2023-12-25 (×10): 3 mg via ORAL
  Filled 2023-12-20 (×12): qty 1

## 2023-12-20 MED ORDER — ROSUVASTATIN CALCIUM 20 MG PO TABS
20.0000 mg | ORAL_TABLET | Freq: Every day | ORAL | Status: DC
  Administered 2023-12-21 – 2023-12-25 (×5): 20 mg via ORAL
  Filled 2023-12-20 (×6): qty 1

## 2023-12-20 MED ORDER — OLANZAPINE 10 MG IM SOLR
5.0000 mg | Freq: Once | INTRAMUSCULAR | Status: AC
  Administered 2023-12-20: 5 mg via INTRAMUSCULAR
  Filled 2023-12-20: qty 10

## 2023-12-20 NOTE — ED Notes (Signed)
 Bed alarm sounding. Staff at bedside. Pt redirected to stay in bed

## 2023-12-20 NOTE — NC FL2 (Signed)
 [place]  MEDICAID FL2 LEVEL OF CARE FORM     IDENTIFICATION  Patient Name: Anne [name] Birthdate: July 24, 1950 Sex: female Admission Date (Current Location): 12/19/2023  [place] and [place] Number:  Chiropodist and Address:  [facility], [address] [place], [name] [address]      Provider Number: [id number]  Attending Physician Name and Address:  Ernest Ronal FORBES, MD  Relative Name and Phone Number:  Kytzia, Gienger (Spouse)  [phone] (Mobile)    Current Level of Care: Hospital Recommended Level of Care: Memory Care Prior Approval Number:    Date Approved/Denied: 12/20/23 PASRR Number: [id number]  Discharge Plan:  (Memory Care)    Current Diagnoses: Patient Active Problem List   Diagnosis Date Noted   Rash 11/21/2023   Bilateral impacted cerumen 09/27/2023   Seizure disorder (HCC) 06/14/2023   Bursitis 06/14/2023   Hypoxia 06/02/2023   Memory impairment 05/31/2023   Aggressive behavior 05/06/2023   Dementia with agitation (HCC) 04/05/2023   Suicidal ideation 03/24/2023   Altered mental status 03/15/2023   Abnormal urinalysis 03/15/2023   Weakness 02/26/2023   Acute urinary retention 02/05/2023   Seizure (HCC) 01/22/2023   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/22/2023   Tremor 11/23/2022   Urinary urgency 03/26/2022   Sore throat 03/07/2022   Seizure-like activity (HCC) 02/08/2022   Seborrheic keratosis 09/20/2021   Closed fracture of nasal bones 07/26/2021   Falls frequently 07/26/2021   Head injury 07/07/2021   Left hip pain 07/07/2021   Lipoma 07/07/2021   Slurred speech 06/07/2021   Subdural hematoma (HCC) 01/23/2021   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   Aortic atherosclerosis 08/24/2020   Dementia with behavioral disturbance (HCC) 03/14/2020   Left shoulder pain 07/22/2019   Hypothyroidism 02/16/2019   Goals of care, counseling/discussion 08/06/2018   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), in remission, 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   Skin tear of forearm without complication, initial encounter 12/06/2017   Bruising 12/06/2017   Overweight 08/05/2017   Anemia 01/23/2017   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 Yes; Fluid consistency: Thin: General starting at 11/13 2024)  AMBULATORY STATUS COMMUNICATION OF NEEDS Skin   Supervision Verbally Normal                       Personal Care Assistance Level of Assistance  Bathing, Feeding, Dressing Bathing Assistance: Limited assistance Feeding assistance: Independent Dressing Assistance: Limited assistance     Functional Limitations Info  Hearing   Hearing Info: Impaired      SPECIAL CARE FACTORS FREQUENCY                       Contractures Contractures Info: Not present    Additional Factors Info  Allergies   Allergies Info: Bee Venom; Honey Bee Venom           Current Medications (12/20/2023):  This is the current hospital active medication list Current Facility-Administered Medications  Medication  Dose Route Frequency Provider Last Rate Last Admin   amLODipine  (NORVASC ) tablet 5 mg  5 mg Oral Daily Ernest Ronal BRAVO, MD       divalproex  (DEPAKOTE ) DR tablet 250 mg  250 mg Oral BID Ernest Ronal BRAVO, MD       [START ON 12/21/2023] levothyroxine  (SYNTHROID ) tablet 112 mcg  112 mcg Oral QAC breakfast Ernest Ronal BRAVO, MD       memantine  (NAMENDA ) tablet 10 mg  10 mg Oral BID Ernest Ronal BRAVO, MD       QUEtiapine  (SEROQUEL ) tablet 100 mg  100 mg Oral q AM Ernest Ronal BRAVO, MD       rivastigmine  (EXELON ) capsule 3 mg  3 mg Oral BID Ernest Ronal BRAVO, MD       rosuvastatin  (CRESTOR ) tablet 20 mg  20 mg Oral Daily Ernest Ronal BRAVO, MD       sertraline  (ZOLOFT ) tablet 100 mg  100 mg Oral QHS Ernest Ronal BRAVO, MD       traZODone  (DESYREL ) tablet 50 mg  50 mg Oral QHS Ernest Ronal BRAVO, MD       zonisamide  (ZONEGRAN ) capsule 100 mg  100 mg Oral BID Ernest Ronal BRAVO, MD       Current Outpatient Medications  Medication Sig Dispense Refill   acetaminophen  (TYLENOL ) 500 MG tablet Take 1,000 mg by mouth every 6 (six) hours as needed for moderate pain (pain score 4-6). (Patient taking differently: Take 500 mg by mouth every 6 (six) hours as needed for moderate pain (pain score 4-6).)     amLODipine  (NORVASC ) 5 MG tablet Take 1 tablet (5 mg total) by mouth daily. 30 tablet 3   divalproex  (DEPAKOTE ) 250 MG DR tablet Take 1 tablet (250 mg total) by mouth 2 (two) times daily. 180 tablet 3   levothyroxine  (SYNTHROID ) 112 MCG tablet Take 1 tablet (112 mcg total) by mouth daily before breakfast. 90 tablet 3   memantine  (NAMENDA ) 10 MG tablet Take 1 tablet (10 mg total) by mouth 2 (two) times daily. 180 tablet 3   Midazolam  (NAYZILAM ) 5 MG/0.1ML SOLN Place 5 mg into the nose as needed. 2 each 2   Multiple Vitamin (MULTIVITAMIN) tablet Take 1 tablet by mouth daily. 90 tablet 3   permethrin (ELIMITE) 5 % cream Apply from the neck down to soles of feet, wash off after 8-12 hours. 60 g 1   QUEtiapine  (SEROQUEL ) 50 MG tablet Take 2 tablets (100 mg total) by mouth in the morning. 180 tablet 1   rivastigmine  (EXELON ) 3 MG capsule Take 1 capsule (3 mg total) by mouth 2 (two) times daily. 180 capsule 3   rosuvastatin  (CRESTOR ) 20 MG tablet Take 1 tablet (20 mg total) by mouth daily. 90 tablet 3   sertraline  (ZOLOFT ) 100 MG tablet Take 1 tablet (100 mg total) by mouth at bedtime. 90 tablet 3   traZODone  (DESYREL ) 50 MG tablet  Take 1 tablet (50 mg total) by mouth at bedtime. 90 tablet 3   triamcinolone cream (KENALOG) 0.5 % Apply 1 Application topically 2 (two) times daily. 30 g 0   zonisamide  (ZONEGRAN ) 100 MG capsule Take 1 capsule (100 mg total) by mouth 2 (two) times daily. 180 capsule 1   Facility-Administered Medications Ordered in Other Encounters  Medication Dose Route Frequency Provider Last Rate Last Admin   denosumab  (XGEVA ) injection 120 mg  120 mg Subcutaneous Q30 days Rao, Archana C, MD   120 mg at 03/05/19 1525  Discharge Medications: Please see discharge summary for a list of discharge medications.  Relevant Imaging Results:  Relevant Lab Results:   Additional Information [id number]  Wyoma Genson L Qasim [place], [name]

## 2023-12-20 NOTE — ED Notes (Signed)
VOL/Psych Consult Pending  

## 2023-12-20 NOTE — ED Notes (Signed)
 Patient given dinner tray and ice cream.

## 2023-12-20 NOTE — Consult Note (Signed)
 [facility] Consult Note  Patient Name: Anne [name] MRN: [id number] DOB: Jun 28, 1950 DATE OF Consult: 12/20/2023  PRIMARY PSYCHIATRIC DIAGNOSES  1.  Dementia with behavioral disturbance    RECOMMENDATIONS  Recommendations: Medication recommendations: Unclear if what medications patient is and is not taking per chart review, so do not want to make changes without knowing if her husband is helping give her her medication. Recommend outpatient follow up and conversation with family about placement if they cannot care for her Non-Medication/therapeutic recommendations: Follow up with primary care provider and consider conversation about placement if family unable to manage patient at home Is inpatient psychiatric hospitalization recommended for this patient? No (Explain why): Calm, cooperative, denies suicidal and homicidal ideation, no evidence of behavioral disturbance at time of evaluation From a psychiatric perspective, is this patient appropriate for discharge to an outpatient setting/resource or other less restrictive environment for continued care?  Yes (Explain why): Patient is calm and cooperative, denies suicidal and homicidal ideation, no evidence of behavioral disturbance. Recommend consideration as an outpatient of conversation with family about placement.  Follow-Up Telepsychiatry C/L services: We will sign off for now. Please re-consult our service if needed for any concerning changes in the patient's condition, discharge planning, or questions. Communication: Treatment team members (and family members if applicable) who were involved in treatment/care discussions and planning, and with whom we spoke or engaged with via secure text/chat, include the following: Team via Epic chat  Thank you for involving us  in the care of this patient. If you have any additional questions or concerns, please call [phone] and ask for me or the provider on-call.  TELEPSYCHIATRY ATTESTATION &  CONSENT  As the provider for this telehealth consult, I attest that I verified the patient's identity using two separate identifiers, introduced myself to the patient, provided my credentials, disclosed my location, and performed this encounter via a HIPAA-compliant, real-time, face-to-face, two-way, interactive audio and video platform and with the full consent and agreement of the patient (or guardian as applicable.)  Patient physical location: ED in [facility]  Telehealth provider physical location: home office in state of [place]    Video start time: 820 AM EST Video end time: 832 AM EST   IDENTIFYING DATA  Anne [name] is a 73 y.o. year-old female for whom a psychiatric consultation has been ordered by the primary provider. The patient was identified using two separate identifiers.  CHIEF COMPLAINT/REASON FOR CONSULT  Dementia with agitation    HISTORY OF PRESENT ILLNESS (HPI)  Anne [name] is a 73 year old female with a history of dementia with behavioral disturbance, depression, hypertension, hyperlipidemia brought to the ED from home due to sundowning, patient reportedly more combative at home. Patient reportedly calm and cooperative in the ED. Chart reviewed. Patient seen for similar concerns in the ED previously. Psychiatry consulted for evaluation and management.   On evaluation, patient noted to be oriented to person, not to date, states the year is 20 something, disoriented to location and situation. Patient does not know why she is in the ED or that she is in the ED. She reports that she lives in [place] with her husband. Patient states she doesn't take any medication. She denies that she has any medical problems. Patient denies depressed mood, other depressive symptoms. Denies suicidal ideation, intent, plan. Denies symptoms consistent with mania/hypomania, paranoia, auditory and visual hallucinations, homicidal ideation. Patient does not know who the current  President of the [place]  is. Patient unable to meaningfully participate in evaluation  otherwise due to significantly impaired cognitive function.    Attempted to call patient's husband x 2 [phone]), there was no answer.    PAST PSYCHIATRIC HISTORY    C-SSRS 1) In the past month have you wished you were dead or wished you could go to sleep and not wake up? []  Yes [x]   No 2) In the past month have you actually had any thoughts of killing yourself? []   Yes  [x]   No If YES to 2, ask questions 3, 4, 5, and 6. If NO to 2, go directly to question 6 3) In the past month have you been thinking about how you might do this? []   Yes []   No 4) In the past month have you had these thoughts and had some intention of acting on them?  []   Yes []   No 5) In the past month have you started to work out or worked out the details of how to kill yourself? Do you intend to carry out this plan? []   Yes []   No 6) Have you ever done anything, started to do anything, or prepared to do anything to end your life? []   Yes [x]   No  Otherwise as per HPI above.  PAST MEDICAL HISTORY  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), in remission, 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   Metastatic bone cancer    Seizures (HCC)    Subdural hematoma (HCC)      HOME MEDICATIONS  Facility Ordered Medications  Medication   denosumab  (XGEVA ) injection 120 mg   PTA Medications  Medication Sig   acetaminophen  (TYLENOL ) 500 MG tablet Take 1,000 mg by mouth every 6 (six) hours as needed for moderate pain (pain score 4-6). (Patient taking differently: Take 325 mg by mouth every 6 (six) hours as needed for moderate pain (pain score 4-6).)   Midazolam  (NAYZILAM ) 5 MG/0.1ML SOLN Place 5 mg into the nose as needed.   rosuvastatin   (CRESTOR ) 20 MG tablet Take 1 tablet (20 mg total) by mouth daily.   QUEtiapine  (SEROQUEL ) 50 MG tablet Take 2 tablets (100 mg total) by mouth in the morning.   traZODone  (DESYREL ) 50 MG tablet Take 1 tablet (50 mg total) by mouth at bedtime.   zonisamide  (ZONEGRAN ) 100 MG capsule Take 1 capsule (100 mg total) by mouth 2 (two) times daily.   sertraline  (ZOLOFT ) 100 MG tablet Take 1 tablet (100 mg total) by mouth at bedtime.   levothyroxine  (SYNTHROID ) 112 MCG tablet Take 1 tablet (112 mcg total) by mouth daily before breakfast.   Multiple Vitamin (MULTIVITAMIN) tablet Take 1 tablet by mouth daily.   amLODipine  (NORVASC ) 5 MG tablet Take 1 tablet (5 mg total) by mouth daily.   divalproex  (DEPAKOTE ) 250 MG DR tablet Take 1 tablet (250 mg total) by mouth 2 (two) times daily.   memantine  (NAMENDA ) 10 MG tablet Take 1 tablet (10 mg total) by mouth 2 (two) times daily.   rivastigmine  (EXELON ) 3 MG capsule Take 1 capsule (3 mg total) by mouth 2 (two) times daily.   triamcinolone cream (KENALOG) 0.5 % Apply 1 Application topically 2 (two) times daily.   permethrin (ELIMITE) 5 % cream Apply from the neck down to soles of feet,  wash off after 8-12 hours.     ALLERGIES  Allergies  Allergen Reactions   Bee Venom Swelling   Honey Bee Venom Other (See Comments)    Unknown rxn    SOCIAL & SUBSTANCE USE HISTORY  Social History   Socioeconomic History   Marital status: Divorced    Spouse name: Not on file   Number of children: 2   Years of education: Not on file   Highest education level: High school graduate  Occupational History   Not on file  Tobacco Use   Smoking status: Former    Current packs/day: 0.00    Average packs/day: 2.0 packs/day for 50.0 years (100.0 ttl pk-yrs)    Types: Cigarettes, E-cigarettes    Start date: 02/06/1962    Quit date: 02/07/2012    Years since quitting: 11.8   Smokeless tobacco: Never  Vaping Use   Vaping status: Former   Start date: 10/06/2013   Devices: uses  no liquid  Substance and Sexual Activity   Alcohol use: No    Alcohol/week: 0.0 standard drinks of alcohol   Drug use: No   Sexual activity: Not Currently  Other Topics Concern   Not on file  Social History Narrative   Married   Retired   Engineer, [employer] level of education    No children    1 cup of coffee   Social Drivers of Corporate Investment Banker Strain: High Risk (06/23/2023)   Received from [facility]   Overall Financial Resource Strain (CARDIA)    Difficulty of Paying Living Expenses: Hard  Food Insecurity: No Food Insecurity (06/23/2023)   Received from [facility]   Hunger Vital Sign    Within the past 12 months, you worried that your food would run out before you got the money to buy more.: Never true    Within the past 12 months, the food you bought just didn't last and you didn't have money to get more.: Never true  Transportation Needs: No Transportation Needs (06/23/2023)   Received from [facility]   PRAPARE - Transportation    Lack of Transportation (Medical): No    Lack of Transportation (Non-Medical): No  Physical Activity: Inactive (02/22/2023)   Exercise Vital Sign    Days of Exercise per Week: 0 days    Minutes of Exercise per Session: 0 min  Stress: Patient Unable To Answer (02/22/2023)   [employer] - Occupational Stress Questionnaire    Feeling of Stress : Patient unable to answer  Social Connections: Moderately Isolated (02/20/2023)   Social Connection and Isolation Panel    Frequency of Communication with Friends and Family: Once a week    Frequency of Social Gatherings with Friends and Family: More than three times a week    Attends Religious Services: Never    Database Administrator or Organizations: No    Attends Engineer, Structural: Never    Marital Status: Married   Social History   Tobacco Use  Smoking Status Former   Current packs/day: 0.00   Average packs/day: 2.0 packs/day for  50.0 years (100.0 ttl pk-yrs)   Types: Cigarettes, E-cigarettes   Start date: 02/06/1962   Quit date: 02/07/2012   Years since quitting: 11.8  Smokeless Tobacco Never   Social History   Substance and Sexual Activity  Alcohol Use No   Alcohol/week: 0.0 standard drinks of alcohol   Social History   Substance and Sexual Activity  Drug Use No      FAMILY HISTORY  Family History  Problem Relation Age of Onset   Hypertension Mother    Ovarian cancer Mother 32   Dementia Mother    Heart disease Father    Stroke Father    Seizures Father    Ovarian cancer Sister        ? dx cancer had hyst.   Breast cancer Neg Hx     MENTAL STATUS EXAM (MSE)  Mental Status Exam: General Appearance: Fairly Groomed  Orientation:  Other:  Oriented to person only  Memory:  Immediate;   Poor Recent;   Poor  Concentration:  Concentration: Fair  Recall:  Poor  Attention  Fair  Eye Contact:  Good  Speech:  Clear and Coherent  Language:  Good  Volume:  Normal  Mood: Fine  Affect:  Appropriate  Thought Process:  Disorganized  Thought Content:  Computation  Suicidal Thoughts:  No  Homicidal Thoughts:  No  Judgement:  Impaired  Insight:  Lacking  Psychomotor Activity:  Normal  Akathisia:  Negative  Fund of Knowledge:  Poor    Assets:  Housing Social Support  Cognition:  Impaired  ADL's:  Intact  AIMS (if indicated):       VITALS  Blood pressure 109/74, pulse 73, temperature 98 F (36.7 C), temperature source Oral, resp. rate 16, weight 70 kg, SpO2 98%.  LABS  Admission on 12/19/2023  Component Date Value Ref Range Status   Sodium 12/19/2023 142  135 - 145 mmol/L Final   Potassium 12/19/2023 3.7  3.5 - 5.1 mmol/L Final   Chloride 12/19/2023 105  98 - 111 mmol/L Final   CO2 12/19/2023 29  22 - 32 mmol/L Final   Glucose, Bld 12/19/2023 107 (H)  70 - 99 mg/dL Final   Glucose reference range applies only to samples taken after fasting for at least 8 hours.   BUN 12/19/2023 20  8 - 23  mg/dL Final   Creatinine, Ser 12/19/2023 0.89  0.44 - 1.00 mg/dL Final   Calcium  12/19/2023 9.3  8.9 - 10.3 mg/dL Final   Total Protein 88/86/7974 6.6  6.5 - 8.1 g/dL Final   Albumin 88/86/7974 3.8  3.5 - 5.0 g/dL Final   AST 88/86/7974 23  15 - 41 U/L Final   ALT 12/19/2023 13  0 - 44 U/L Final   Alkaline Phosphatase 12/19/2023 76  38 - 126 U/L Final   Total Bilirubin 12/19/2023 0.3  0.0 - 1.2 mg/dL Final   GFR, Estimated 12/19/2023 >60  >60 mL/min Final   Comment: (NOTE) Calculated using the CKD-EPI Creatinine Equation (2021)    Anion gap 12/19/2023 9  5 - 15 Final   Performed at [facility], [address] [place], [name] [address]   Alcohol, Ethyl (B) 12/19/2023 <15  <15 mg/dL Final   Comment: (NOTE) For medical purposes only. Performed at [facility], [address] [place], [name] [address]    WBC 12/19/2023 4.3  4.0 - 10.5 K/uL Final   RBC 12/19/2023 4.46  3.87 - 5.11 MIL/uL Final   Hemoglobin 12/19/2023 13.0  12.0 - 15.0 g/dL Final   HCT 88/86/7974 41.1  36.0 - 46.0 % Final   MCV 12/19/2023 92.2  80.0 - 100.0 fL Final   MCH 12/19/2023 29.1  26.0 - 34.0 pg Final   MCHC 12/19/2023 31.6  30.0 - 36.0 g/dL Final   RDW 88/86/7974 16.0 (H)  11.5 - 15.5 % Final  Platelets 12/19/2023 149 (L)  150 - 400 K/uL Final   nRBC 12/19/2023 0.0  0.0 - 0.2 % Final   Performed at [facility], [address] [place], [name] [address]    PSYCHIATRIC REVIEW OF SYSTEMS (ROS)  ROS: Notable for the following relevant positive findings: See HPI  Additional findings:      Musculoskeletal: No abnormal movements observed      Gait & Station: Laying/Sitting      Pain Screening: Denies      Nutrition & Dental Concerns: n/a  RISK FORMULATION/ASSESSMENT  Is the patient experiencing any suicidal or homicidal ideations: No       Protective factors considered for safety management: Social support   Risk factors/concerns considered for safety management:  Age  over 20 Impulsivity Aggression  Is there a astronomer plan with the patient and treatment team to minimize risk factors and promote protective factors: Yes           Explain: Follow up with primary care provider; ED return precautions  Is crisis care placement or psychiatric hospitalization recommended: No     Based on my current evaluation and risk assessment, patient is determined at this time to be at:  Moderate Risk  *RISK ASSESSMENT Risk assessment is a dynamic process; it is possible that this patient's condition, and risk level, may change. This should be re-evaluated and managed over time as appropriate. Please re-consult psychiatric consult services if additional assistance is needed in terms of risk assessment and management. If your team decides to discharge this patient, please advise the patient how to best access emergency psychiatric services, or to call 911, if their condition worsens or they feel unsafe in any way.   Erla JAYSON Rase, MD Telepsychiatry Consult Services
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
## 2023-12-20 NOTE — ED Notes (Signed)
 VOL  TOC  PLACEMENT

## 2023-12-20 NOTE — ED Notes (Signed)
 Bed alarm sounding. Staff at bedside.
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
## 2023-12-20 NOTE — ED Notes (Signed)
 Hospital meal provided, pt tolerated w/o complaints.  Waste discarded appropriately.
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
## 2023-12-20 NOTE — TOC Initial Note (Addendum)
 Transition of Care [facility]) - Initial/Assessment Note    Patient Details  Name: Anne [name] MRN: [id number] Date of Birth: April 30, 1950  Transition of Care [facility]) CM/SW Contact:    Nasteho Glantz L Shaun Runyon, LCSW Phone Number: 12/20/2023, 11:54 AM  Clinical Narrative:                    [facility] consult received for SNF placement. Patient has dementia. Per chart review, there are some concerns related to dementia behaviors. CSW reached out to [facility] and [place], [employer], to no avail. CSW also left a HIPAA compliant voicemail with the Social Work Supervisor, Jessica Blue.   FL2 can not be completed until clarity is provided regarding patients needs and payor source. This patient will be DTP as memory care appears to be the more appropriate option unless DSS already has a contract with a SNF for placement.   12:11pm: Return call received from [facility] at [employer]. Leonor advised that patient was at [facility] this summer and she had LTC Medicaid. She advised that she has to connect with the Medicaid Supervisor at DSS to confirm that the LTC Medicaid can be reinstated for patient. CSW advised that memory care is more appropriate for patient. CSW also advised that there are no LTC beds available in [facility].   CSW will complete the FL2 and search for beds in memory care. CSW will also email [place] the list of facilities for the placement SW at DSS to follow-up with.        Patient Goals and CMS Choice            Expected Discharge Plan and Services                                              Prior Living Arrangements/Services                       Activities of Daily Living      Permission Sought/Granted                  Emotional Assessment              Admission diagnosis:  Sundowning Patient Active Problem List   Diagnosis Date Noted   Rash 11/21/2023   Bilateral impacted cerumen 09/27/2023   Seizure disorder (HCC) 06/14/2023    Bursitis 06/14/2023   Hypoxia 06/02/2023   Memory impairment 05/31/2023   Aggressive behavior 05/06/2023   Dementia with agitation (HCC) 04/05/2023   Suicidal ideation 03/24/2023   Altered mental status 03/15/2023   Abnormal urinalysis 03/15/2023   Weakness 02/26/2023   Acute urinary retention 02/05/2023   Seizure (HCC) 01/22/2023   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/22/2023   Tremor 11/23/2022   Urinary urgency 03/26/2022   Sore throat 03/07/2022   Seizure-like activity (HCC) 02/08/2022   Seborrheic keratosis 09/20/2021   Closed fracture of nasal bones 07/26/2021   Falls frequently 07/26/2021   Head injury 07/07/2021   Left hip pain 07/07/2021   Lipoma 07/07/2021   Slurred speech 06/07/2021   Subdural hematoma (HCC) 01/23/2021   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   Aortic atherosclerosis 08/24/2020   Dementia with behavioral disturbance (HCC) 03/14/2020   Left shoulder pain 07/22/2019   Hypothyroidism 02/16/2019   Goals of care, counseling/discussion 08/06/2018   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[facility]), in remission, 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   Skin tear of forearm without complication, initial encounter 12/06/2017   Bruising 12/06/2017   Overweight 08/05/2017   Anemia 01/23/2017   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 [name] [address] Phone: [phone] Fax: [phone]  [facility] [address] [name] [address] Phone: [phone] Fax: [phone]  [facility] [address] [name]  [address] Phone: [phone] Fax: [phone]  [facility] [address] [name] [address] Phone: [phone] Fax: [phone]     Social Drivers of Health (SDOH) Social History: SDOH Screenings   Food Insecurity: No Food Insecurity (06/23/2023)   Received from [facility]  Housing: Low Risk  (04/24/2023)  Transportation Needs: No Transportation Needs (06/23/2023)   Received from [facility]  Utilities: Not At Risk (04/24/2023)  Alcohol Screen: Low Risk  (02/22/2023)  Depression (PHQ2-9): Low Risk  (11/21/2023)  Financial Resource Strain: High Risk (06/23/2023)   Received from [facility]  Physical Activity: Inactive (02/22/2023)  Social Connections: Moderately Isolated (02/20/2023)  Stress: Patient Unable To Answer (02/22/2023)  Tobacco Use: Medium Risk (12/19/2023)  Health Literacy: Patient Unable To Answer (02/22/2023)   SDOH Interventions:     Readmission Risk Interventions     No data to display

## 2023-12-20 NOTE — ED Notes (Signed)
 Psych tele set up at bedside. RN in room to help with assessment due to pt being [facility]

## 2023-12-20 NOTE — ED Notes (Signed)
 Patient had an episode of bowel incontinence. Patient was cleaned up and placed in a clean brief. Patient returned to bed without complication.

## 2023-12-20 NOTE — ED Notes (Signed)
 Pt given water and ambulated to restroom with steady gait

## 2023-12-20 NOTE — ED Notes (Signed)
 Patient given a warm blanket. Patient resting in bed at this time. Lights turned off to decrease environmental stimuli.
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
## 2023-12-20 NOTE — ED Notes (Signed)
 Bed alarm sounding. Staff at bedside. Pt redirected back into bed.

## 2023-12-20 NOTE — ED Notes (Signed)
 Pt given meal tray.
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
## 2023-12-20 NOTE — ED Provider Notes (Signed)
 Emergency Medicine Observation Re-evaluation Note   BP 109/74   Pulse 73   Temp 98 F (36.7 C) (Oral)   Resp 16   Wt 70 kg   SpO2 98%   BMI 24.17 kg/m    ED Course / MDM   No reported events during my shift at the time of this note.   Pt is awaiting dispo from consultants   Ginnie Shams MD    Shams Ginnie, MD 12/20/23 [phone]

## 2023-12-20 NOTE — Evaluation (Signed)
 Physical Therapy Evaluation Patient Details Name: Anne [name] MRN: [id number] DOB: 1950/07/06 Today's Date: 12/20/2023  History of Present Illness  Anne [name] is a 73 y.o. female with a past medical history of dementia, gastric reflux, hypertension, hyperlipidemia, presents to the emergency department for agitation. Pt has dementia at baseline.  Clinical Impression  Pt is 73 y.o. female admitted for psychiatric evaluation. Pt is poor historian and unable to provide meaningful information about PLOF. Information about PLOF gathered from chart review and previous admissions.  Prior to hospitalization pt was IND with amb; family members would assist pt's balance by holding onto her arms. Pt received supervision from  her husband when performing ADLs not usually requiring physical assistance. This evaluation pt able to amb 38ft x 2 to bathroom with 1 HHA to steady balance; pt occasionally reaching for external surfaces to prevent LOB. Cuing for redirection to task. Pt demonstrates deficits in strength/balance affecting her ability to safely perform functional mobility tasks. Would benefit from skilled PT to address above deficits and promote optimal return to PLOF.       If plan is discharge home, recommend the following: A little help with walking and/or transfers;A little help with bathing/dressing/bathroom;Assist for transportation;Direct supervision/assist for medications management;Direct supervision/assist for financial management;Supervision due to cognitive status   Can travel by private vehicle        Equipment Recommendations Other (comment) (TBD at next venue)  Recommendations for Other Services       Functional Status Assessment Patient has had a recent decline in their functional status and demonstrates the ability to make significant improvements in function in a reasonable and predictable amount of time.     Precautions / Restrictions Precautions Precautions:  Fall Recall of Precautions/Restrictions: Impaired Restrictions Weight Bearing Restrictions Per Provider Order: No      Mobility  Bed Mobility Overal bed mobility: Modified Independent             General bed mobility comments: Able to move from supine<>sit without physical assistance. HOB slightly elevated. Minor use of bed rails    Transfers Overall transfer level: Needs assistance Equipment used: None Transfers: Sit to/from Stand Sit to Stand: Contact guard assist           General transfer comment: Constant CGA for safety    Ambulation/Gait Ambulation/Gait assistance: Min assist Gait Distance (Feet):  (66ft x 2) Assistive device: 1 person hand held assist Gait Pattern/deviations: Decreased step length - right, Decreased step length - left, Decreased dorsiflexion - right, Decreased dorsiflexion - left, Shuffle, Wide base of support Gait velocity: dec     General Gait Details: Unsteady with shuffled gait pattern. Pt offered HHA however does not rely on HHA for balance. Intermittently holding onto external surfaces to maintain balance.  Stairs            Wheelchair Mobility     Tilt Bed    Modified Rankin (Stroke Patients Only)       Balance Overall balance assessment: Needs assistance Sitting-balance support: Feet supported, No upper extremity supported Sitting balance-Leahy Scale: Good Sitting balance - Comments: Steady static sitting balance. Pt demos good trunk control and is able to weight shift to L to perform hygiene after toileting.   Standing balance support: Single extremity supported, During functional activity Standing balance-Leahy Scale: Fair Standing balance comment: Steady upon standing, however would be safer with AD  Pertinent Vitals/Pain Pain Assessment Pain Assessment: PAINAD Breathing: normal Negative Vocalization: none Facial Expression: smiling or inexpressive Body Language:  relaxed Consolability: no need to console PAINAD Score: 0    Home Living Family/patient expects to be discharged to:: Private residence Living Arrangements: Spouse/significant other Available Help at Discharge: Family;Available 24 hours/day Type of Home: House         Home Layout: One level Home Equipment: Cane - single Librarian, Academic (2 wheels) Additional Comments: PLOF obtained from previous hospitalization. Per previous eval, the clinician who got this info was able to speak with this patient's daughter    Prior Function Prior Level of Function : Patient poor historian/Family not available             Mobility Comments: Per chart review, pt is IND with amb. Family holds patient's arm for steadying. ADLs Comments: Per previous evaluation and chart review: pt recieves supervision from spouse for safety. Pt is able to perform ADLs     Extremity/Trunk Assessment   Upper Extremity Assessment Upper Extremity Assessment: Generalized weakness    Lower Extremity Assessment Lower Extremity Assessment: Generalized weakness    Cervical / Trunk Assessment Cervical / Trunk Assessment: Kyphotic  Communication   Communication Communication: Impaired Factors Affecting Communication: Hearing impaired    Cognition Arousal: Alert Behavior During Therapy: Restless   PT - Cognitive impairments: History of cognitive impairments                       PT - Cognition Comments: agreeable to therapy session. Max cuing for redirection to task Following commands: Impaired Following commands impaired: Follows one step commands with increased time     Cueing Cueing Techniques: Verbal cues, Visual cues     General Comments      Exercises     Assessment/Plan    PT Assessment Patient needs continued PT services  PT Problem List Decreased strength;Decreased balance;Decreased mobility;Decreased cognition;Decreased knowledge of use of DME;Decreased safety awareness        PT Treatment Interventions Gait training;Functional mobility training;Therapeutic activities;Therapeutic exercise;Balance training;Neuromuscular re-education;Patient/family education    PT Goals (Current goals can be found in the Care Plan section)  Acute Rehab PT Goals Patient Stated Goal: none stated PT Goal Formulation: Patient unable to participate in goal setting Time For Goal Achievement: 01/03/24 Potential to Achieve Goals: Fair    Frequency Min 1X/week     Co-evaluation               AM-PAC PT 6 Clicks Mobility  Outcome Measure Help needed turning from your back to your side while in a flat bed without using bedrails?: A Little Help needed moving from lying on your back to sitting on the side of a flat bed without using bedrails?: A Little Help needed moving to and from a bed to a chair (including a wheelchair)?: A Little Help needed standing up from a chair using your arms (e.g., wheelchair or bedside chair)?: A Little Help needed to walk in hospital room?: A Little Help needed climbing 3-5 steps with a railing? : A Little 6 Click Score: 18    End of Session   Activity Tolerance: Patient tolerated treatment well Patient left: in bed;with bed alarm set Nurse Communication: Mobility status PT Visit Diagnosis: Unsteadiness on feet (R26.81);Muscle weakness (generalized) (M62.81)    Time: 8690-8680 PT Time Calculation (min) (ACUTE ONLY): 10 min   Charges:  Shad Ledvina, SPT   Bret Stamour 12/20/2023, 2:05 PM

## 2023-12-21 ENCOUNTER — Other Ambulatory Visit: Payer: Self-pay

## 2023-12-21 DIAGNOSIS — R451 Restlessness and agitation: Secondary | ICD-10-CM | POA: Diagnosis not present

## 2023-12-21 NOTE — ED Notes (Signed)
 Patient is vol pending placement

## 2023-12-21 NOTE — ED Notes (Signed)
 Vol toc placement

## 2023-12-21 NOTE — ED Provider Notes (Signed)
-----------------------------------------   5:12 AM on 12/21/2023 -----------------------------------------   Blood pressure (!) 140/86, pulse 70, temperature 97.9 F (36.6 C), temperature source Oral, resp. rate 18, weight 70 kg, SpO2 98%.  The patient is calm and cooperative at this time.  There have been no acute events since the last update.  Awaiting disposition plan from [facility] team.   Gordan Huxley, MD 12/21/23 [phone]

## 2023-12-21 NOTE — ED Notes (Signed)
 This RN spoke with patient's spouse, Addi Pak. Reports he attempted to have [employer] [place] appoint a guardian for her 3 weeks ago - has not heard back on status. Spouse explains he was in the process of trying to arrange for skilled nursing facility/care. Requesting to be contacted in regards to placement. Nursing reported would put note in chart. Spouse reports his best contact number is [phone]. He also relays that her daughters are not involved in their lives and he prefers they not be contacted in regards to patient's care.

## 2023-12-22 DIAGNOSIS — R451 Restlessness and agitation: Secondary | ICD-10-CM | POA: Diagnosis not present

## 2023-12-22 NOTE — ED Notes (Signed)
 Pt given cup of iced water. No further needs at this time.

## 2023-12-22 NOTE — ED Notes (Signed)
 Pt given nighttime snack. No further needs at this time.

## 2023-12-22 NOTE — ED Notes (Signed)
 Vol toc placement

## 2023-12-22 NOTE — ED Notes (Signed)
Pt with visitor at this time.

## 2023-12-22 NOTE — ED Notes (Signed)
 Pt provided with warm blankets and TV turned on for entertainment.

## 2023-12-22 NOTE — ED Notes (Signed)
 Pt heard crying when this tech went in to see what was the matter pt stated she wanted to see her children but they were with her mother and their father

## 2023-12-22 NOTE — ED Notes (Signed)
 Pt provided lunch tray.

## 2023-12-22 NOTE — ED Notes (Signed)
 Pt attempted to wander the hallways, pt redirected back into room. Breakfast tray provided at this time.

## 2023-12-22 NOTE — ED Provider Notes (Signed)
 Emergency Medicine Observation Re-evaluation Note   BP (!) 148/75   Pulse 78   Temp 98.7 F (37.1 C)   Resp 16   Wt 70 kg   SpO2 95%   BMI 24.17 kg/m    ED Course / MDM   No reported events during my shift at the time of this note.   Pt is awaiting dispo from consultants   Ginnie Shams MD    Shams Ginnie, MD 12/22/23 [phone]

## 2023-12-22 NOTE — ED Notes (Signed)
 Pt came out of room wanting to go home. Pt informed that they are not able to go home at this time. Pt redirected back to room.

## 2023-12-22 NOTE — ED Notes (Addendum)
 Pt's visitor reminded that visitation time is limited to 15 mins and their time is up. Visitor verbalized understanding and said their goodbyes to pt.

## 2023-12-22 NOTE — ED Notes (Signed)
Pt provided with dinner tray and soda 

## 2023-12-23 DIAGNOSIS — R451 Restlessness and agitation: Secondary | ICD-10-CM

## 2023-12-23 MED ORDER — QUETIAPINE FUMARATE 25 MG PO TABS
50.0000 mg | ORAL_TABLET | Freq: Two times a day (BID) | ORAL | Status: DC
  Administered 2023-12-23: 50 mg via ORAL
  Filled 2023-12-23: qty 2

## 2023-12-23 MED ORDER — OLANZAPINE 10 MG IM SOLR: 5.0000 mg | Freq: Once | INTRAMUSCULAR | Status: DC

## 2023-12-23 MED ORDER — OLANZAPINE 5 MG PO TABS: 5.0000 mg | ORAL_TABLET | Freq: Three times a day (TID) | ORAL | Status: DC | PRN

## 2023-12-23 MED ORDER — OLANZAPINE 10 MG IM SOLR
5.0000 mg | Freq: Three times a day (TID) | INTRAMUSCULAR | Status: DC | PRN
  Administered 2023-12-23 (×2): 5 mg via INTRAMUSCULAR
  Filled 2023-12-23 (×2): qty 10

## 2023-12-23 MED ORDER — OLANZAPINE 5 MG PO TBDP: 10.0000 mg | ORAL_TABLET | Freq: Once | ORAL | Status: DC | PRN

## 2023-12-23 MED ORDER — OLANZAPINE 5 MG PO TBDP
5.0000 mg | ORAL_TABLET | Freq: Once | ORAL | Status: AC | PRN
  Administered 2023-12-23: 5 mg via ORAL
  Filled 2023-12-23: qty 1

## 2023-12-23 MED ORDER — MIDAZOLAM HCL (PF) 2 MG/2ML IJ SOLN
2.0000 mg | Freq: Once | INTRAMUSCULAR | Status: DC
  Filled 2023-12-23: qty 2

## 2023-12-23 MED ORDER — DROPERIDOL 2.5 MG/ML IJ SOLN
2.5000 mg | Freq: Once | INTRAMUSCULAR | Status: AC
  Administered 2023-12-23: 2.5 mg via INTRAMUSCULAR
  Filled 2023-12-23: qty 2
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
## 2023-12-23 NOTE — ED Notes (Signed)
 Patient with continued agitation over wanting to leave ED. Patient informed several times that she is not allowed to leave and that she will be remaining here tonight. Patient asked several times to remain in room and continues to be disruptive.

## 2023-12-23 NOTE — ED Notes (Signed)
 Pt up talking to the quad officer about i'ma whoop your ass. Pt becoming increasingly agitated and verbally threatening officers and nurse techs. Pt also continues to try and leave stating I'm leaving and going home. EDP notified. See eMAR

## 2023-12-23 NOTE — ED Notes (Signed)
 ED RN taking over care of pt at this time after receiving report from outgoing RN. Pt ABCs intact. RR even and unlabored. Pt in NAD. Bed in lowest locked position.
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
## 2023-12-23 NOTE — ED Notes (Signed)
 Pt broke glasses and pointed ear piece at officer stating :I'm going to get my drink pt was informed that this was not appropriate behavior and all pieces of the glasses were taken from pt and added to her belongings bag. Pt was provided with water.
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
## 2023-12-23 NOTE — ED Notes (Signed)
 Pt provided with breakfast tray. Tray left at nurses station. Pt is asleep.

## 2023-12-23 NOTE — ED Notes (Signed)
 Assisted back to bed. Remains stable/steady gait, breathing easy/comfortably. No complaints.

## 2023-12-23 NOTE — ED Notes (Signed)
 Dinner tray provided to pt

## 2023-12-23 NOTE — Consult Note (Signed)
 Psychiatry rounded on patient today.  Patient was noted to be sleeping in bed.  Due to previous behaviors noted in patient chart review, this provider let patient continue to rest.  It appears that patient received both IM and oral agitation medications on 12/23/2023.  Per review of notes, it is noted that patient was verbally aggressive with staff members and attempting to leave the facility.  We attempted to contact DSS APS worker listed on patient chart, Leonor Ply to discuss medication changes.  Per our [facility] team here at [facility], DSS workers do not want patient's spouse involved in decision making at this time.  Unable to get in contact with APS worker, request for return phone call made.  Due to patient's behavior and increasing aggression however, we will adjust Seroquel  to by daily dosing instead of 1 time a day to help target aggressive behaviors.  We ordered an EKG to assess patient's QTc monitoring, however due to patient's current presentation and aggressive behaviors, this may be difficult to obtain by nursing staff. EKG on 06/22/2023 showed Qtc of 458.  Due to patient's recent behaviors, Zyprexa  5 mg p.o. or IM every 8 hours PRN was added to current medication regimen.  Psychiatry will continue to assist medical team with medication management while patient in [facility] boarder status pending placement.

## 2023-12-23 NOTE — ED Notes (Signed)
 Pt high fall risk per protocol- non skid socks and bracelet on but pt walks independently without difficulty.

## 2023-12-23 NOTE — TOC Progression Note (Signed)
 Transition of Care [facility]) - Progression Note    Patient Details  Name: Anne [name] MRN: [id number] Date of Birth: 26-Aug-1950  [facility] CM/SW Contact  [name], LCSW Phone Number: 12/23/2023, 2:34 PM  Clinical Narrative:    Chart reviewed. I called Leonor Ply to follow up on placement for patient. SNF referrals are still pending.                     Expected Discharge Plan and Services                                               Social Drivers of Health (SDOH) Interventions SDOH Screenings   Food Insecurity: No Food Insecurity (06/23/2023)   Received from [facility]  Housing: Low Risk  (04/24/2023)  Transportation Needs: No Transportation Needs (06/23/2023)   Received from [facility]  Utilities: Not At Risk (04/24/2023)  Alcohol Screen: Low Risk  (02/22/2023)  Depression (PHQ2-9): Low Risk  (11/21/2023)  Financial Resource Strain: High Risk (06/23/2023)   Received from [facility]  Physical Activity: Inactive (02/22/2023)  Social Connections: Moderately Isolated (02/20/2023)  Stress: Patient Unable To Answer (02/22/2023)  Tobacco Use: Medium Risk (12/19/2023)  Health Literacy: Patient Unable To Answer (02/22/2023)    Readmission Risk Interventions     No data to display

## 2023-12-23 NOTE — ED Provider Notes (Signed)
-----------------------------------------   2:58 AM on 12/23/2023 -----------------------------------------   Blood pressure 106/69, pulse 68, temperature 97.9 F (36.6 C), temperature source Oral, resp. rate 18, weight 70 kg, SpO2 99%.  Patient is agitated and unruly, verbally abusive to staff and engineer, materials, difficult to redirect.  I ordered a dose of Zyprexa  5 mg p.o. if she will take it.  Otherwise she may require an intramuscular injection for her safety and safety of others   ----------------------------------------- 3:50 AM on 12/23/2023 -----------------------------------------  Despite voluntarily taking Zyprexa  5 mg p.o., the patient's behaviors continued to escalate.  She is now verbally threatening the engineer, materials and cannot be redirected.  Weighing the risks and benefits, I ordered droperidol 2.5 mg intramuscular, lower than the typical dose of 5 mg I would order for acute agitation but in acknowledgment of her age and the fact that she has already received a dose of Zyprexa .  I believe this will be a safer alternative to benzodiazepines given her age, as well as having a faster onset of action than haloperidol .   ----------------------------------------- 4:21 AM on 12/23/2023 -----------------------------------------  Approximately 15 minutes after droperidol administration, patient is screaming even the louder and continues to threaten staff.  It is not my first choice, but I am worried about the amount of antipsychotic she is already received, so I have ordered Versed  2 mg intramuscular.   ----------------------------------------- 5:35 AM on 12/23/2023 -----------------------------------------  Patient went to sleep before getting the intramuscular Versed .    Gordan Huxley, MD 12/23/23 [phone]

## 2023-12-24 DIAGNOSIS — R451 Restlessness and agitation: Secondary | ICD-10-CM | POA: Diagnosis not present

## 2023-12-24 MED ORDER — QUETIAPINE FUMARATE 25 MG PO TABS
100.0000 mg | ORAL_TABLET | Freq: Every day | ORAL | Status: DC
  Administered 2023-12-25: 100 mg via ORAL
  Filled 2023-12-24: qty 4

## 2023-12-24 MED ORDER — QUETIAPINE FUMARATE 25 MG PO TABS
50.0000 mg | ORAL_TABLET | Freq: Every day | ORAL | Status: DC
  Administered 2023-12-24 – 2023-12-25 (×2): 50 mg via ORAL
  Filled 2023-12-24 (×2): qty 2

## 2023-12-24 NOTE — ED Notes (Signed)
 Pt restless and anxious. Repeatedly running out of room to see if my husband is here to get me. Pt given mixed fruit and water as night-time snack.

## 2023-12-24 NOTE — ED Notes (Signed)
 Pt refused dinner tray

## 2023-12-24 NOTE — Consult Note (Signed)
 Patient was seen today for psychiatric re-evaluation. Patient was sleeping during the initial rounds this morning and nursing staff requested to allow her to sleep secondary to patient displaying occasionally agile, verbally aggressive behaviors, and wanders out of her room. This provider re-visited patient for a clinical update on her status. Patient is extremely hard of hearing and it was difficult for her to understand without speaking to her loudly. Patient is ruminating on leaving the hospital, as she repetitively replied, I am ready to leave this place. Patient remains medication compliant with no observation of adverse reactions. Patient has not required PRN medications for transient symptoms per chart review. She continues to deny any current suicidal-homicidal ideations, or perceptual disturbances. She continues to await transition for placement.

## 2023-12-24 NOTE — TOC Progression Note (Signed)
 Transition of Care [facility]) - Progression Note    Patient Details  Name: Anne [name] MRN: [id number] Date of Birth: [birth year]  Transition of Care [facility]) CM/SW Contact  [name], LCSW Phone Number: 12/24/2023, 4:11 PM  Clinical Narrative:    Chart reviewed. I called the patient husband to speak with him about disposition plan. Left a VM for the husband to return call.                      Expected Discharge Plan and Services                                               Social Drivers of Health (SDOH) Interventions SDOH Screenings   Food Insecurity: No Food Insecurity (06/23/2023)   Received from [facility]  Housing: Low Risk  (04/24/2023)  Transportation Needs: No Transportation Needs (06/23/2023)   Received from [facility]  Utilities: Not At Risk (04/24/2023)  Alcohol Screen: Low Risk  (02/22/2023)  Depression (PHQ2-9): Low Risk  (11/21/2023)  Financial Resource Strain: High Risk (06/23/2023)   Received from [facility]  Physical Activity: Inactive (02/22/2023)  Social Connections: Moderately Isolated (02/20/2023)  Stress: Patient Unable To Answer (02/22/2023)  Tobacco Use: Medium Risk (12/19/2023)  Health Literacy: Patient Unable To Answer (02/22/2023)    Readmission Risk Interventions     No data to display

## 2023-12-24 NOTE — ED Provider Notes (Signed)
 BP 126/74 (BP Location: Left Arm)   Pulse 67   Temp 98 F (36.7 C) (Oral)   Resp 16   Wt 70 kg   SpO2 94%   BMI 24.17 kg/m   Patient resting comfortably in bed.  No acute events overnight, has not required further rounds of sedation on my shift.  Remains awaiting disposition per TOC.    Clarine Ozell LABOR, MD 12/24/23 [phone]

## 2023-12-24 NOTE — TOC Progression Note (Signed)
 Transition of Care [facility]) - Progression Note    Patient Details  Name: Anne [name] MRN: [id number] Date of Birth: [birth year]  [facility]) CM/SW Contact  [name], LCSW Phone Number: 12/24/2023, 9:30 AM  Clinical Narrative:    Chart reviewed. SW spoke with Ferron from [place] and I informed her that the patient still has pending memory care placements. Shelby requested the [facility] and placement list anf offered assistance to find placement for the patient.   I have emailed requested documents to [place].                      Expected Discharge Plan and Services                                               Social Drivers of Health (SDOH) Interventions SDOH Screenings   Food Insecurity: No Food Insecurity (06/23/2023)   Received from [facility]  Housing: Low Risk  (04/24/2023)  Transportation Needs: No Transportation Needs (06/23/2023)   Received from [facility]  Utilities: Not At Risk (04/24/2023)  Alcohol Screen: Low Risk  (02/22/2023)  Depression (PHQ2-9): Low Risk  (11/21/2023)  Financial Resource Strain: High Risk (06/23/2023)   Received from [name]  Physical Activity: Inactive (02/22/2023)  Social Connections: Moderately Isolated (02/20/2023)  Stress: Patient Unable To Answer (02/22/2023)  Tobacco Use: Medium Risk (12/19/2023)  Health Literacy: Patient Unable To Answer (02/22/2023)    Readmission Risk Interventions     No data to display

## 2023-12-24 NOTE — ED Notes (Signed)
 Dinner tray provided to pt

## 2023-12-24 NOTE — TOC Progression Note (Signed)
 Transition of Care [facility]) - Progression Note    Patient Details  Name: Anne [name] MRN: [id number] Date of Birth: [birth year]  Transition of Care [facility]) CM/SW Contact  [name], LCSW Phone Number: 12/24/2023, 4:06 PM  Clinical Narrative:    Chart reviewed. I called Leonor back for to answer clarifying questions. Leonor reports that the husband as taken the out of [name] due to her funds going to the facility. DSS reports that they are trying to make sure that the patient funds are protected. Leonor also reports that the husband is trying to sell the their home.  Leonor also said she has not spoken to the husband in a while about placement for the patient but is willing to have that conversation.  She also said she will contact her supervisor to see if the order is able to make more decisions for the patient.                   Expected Discharge Plan and Services                                               Social Drivers of Health (SDOH) Interventions SDOH Screenings   Food Insecurity: No Food Insecurity (06/23/2023)   Received from [facility]  Housing: Low Risk  (04/24/2023)  Transportation Needs: No Transportation Needs (06/23/2023)   Received from [facility]  Utilities: Not At Risk (04/24/2023)  Alcohol Screen: Low Risk  (02/22/2023)  Depression (PHQ2-9): Low Risk  (11/21/2023)  Financial Resource Strain: High Risk (06/23/2023)   Received from [facility]  Physical Activity: Inactive (02/22/2023)  Social Connections: Moderately Isolated (02/20/2023)  Stress: Patient Unable To Answer (02/22/2023)  Tobacco Use: Medium Risk (12/19/2023)  Health Literacy: Patient Unable To Answer (02/22/2023)    Readmission Risk Interventions     No data to display

## 2023-12-24 NOTE — ED Notes (Signed)
 Lunch tray provided to pt.

## 2023-12-24 NOTE — ED Notes (Signed)
 Pt constantly coming into the hall asking to use the phone. Pt is redirected back in the room by officer and this NT.

## 2023-12-25 DIAGNOSIS — R451 Restlessness and agitation: Secondary | ICD-10-CM | POA: Diagnosis not present

## 2023-12-25 MED ORDER — DIVALPROEX SODIUM 500 MG PO DR TAB: 500.0000 mg | DELAYED_RELEASE_TABLET | Freq: Two times a day (BID) | ORAL | Status: DC

## 2023-12-25 NOTE — ED Provider Notes (Signed)
-----------------------------------------   5:05 AM on 12/25/2023 -----------------------------------------   Blood pressure 110/73, pulse 70, temperature 98.1 F (36.7 C), temperature source Oral, resp. rate 16, weight 70 kg, SpO2 94%.  The patient is calm and cooperative at this time.  There have been no acute events since the last update.  Awaiting disposition plan from case management/social work.    Ma Munoz, Josette SAILOR, DO 12/25/23 [phone]

## 2023-12-25 NOTE — ED Notes (Signed)
 Lunch tray provided to pt.

## 2023-12-25 NOTE — ED Notes (Signed)
 Dinner tray provided to pt
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
## 2023-12-25 NOTE — NC FL2 (Signed)
 [place]  MEDICAID FL2 LEVEL OF CARE FORM     IDENTIFICATION  Patient Name: Anne [name] Birthdate: [birth year] Sex: female Admission Date (Current Location): 12/19/2023  [place] and [place] Number:  Chiropodist and Address:  [facility], [address], [place], [name] [address]      Provider Number: [id number]  Attending Physician Name and Address:  Dicky Anes, MD  Relative Name and Phone Number:  Heather, Mckendree (Spouse)  [phone] (Mobile)    Current Level of Care: Hospital Recommended Level of Care: Skilled Nursing Facility Prior Approval Number:    Date Approved/Denied: 12/20/23 PASRR Number: [id number]  Discharge Plan: SNF    Current Diagnoses: Patient Active Problem List   Diagnosis Date Noted   Rash 11/21/2023   Bilateral impacted cerumen 09/27/2023   Seizure disorder (HCC) 06/14/2023   Bursitis 06/14/2023   Hypoxia 06/02/2023   Memory impairment 05/31/2023   Aggressive behavior 05/06/2023   Dementia with agitation (HCC) 04/05/2023   Suicidal ideation 03/24/2023   Altered mental status 03/15/2023   Abnormal urinalysis 03/15/2023   Weakness 02/26/2023   Acute urinary retention 02/05/2023   Seizure (HCC) 01/22/2023   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/22/2023   Tremor 11/23/2022   Urinary urgency 03/26/2022   Sore throat 03/07/2022   Seizure-like activity (HCC) 02/08/2022   Seborrheic keratosis 09/20/2021   Closed fracture of nasal bones 07/26/2021   Falls frequently 07/26/2021   Head injury 07/07/2021   Left hip pain 07/07/2021   Lipoma 07/07/2021   Slurred speech 06/07/2021   Subdural hematoma (HCC) 01/23/2021   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   Aortic atherosclerosis 08/24/2020   Dementia with behavioral disturbance (HCC) 03/14/2020   Left shoulder pain 07/22/2019   Hypothyroidism 02/16/2019   Goals of care, counseling/discussion 08/06/2018   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), in remission, 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   Skin tear of forearm without complication, initial encounter 12/06/2017   Bruising 12/06/2017   Overweight 08/05/2017   Anemia 01/23/2017   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   Gastritis 01/23/2017   Hypokalemia 01/03/2017   Heart murmur 07/27/2016   Low back pain 08/15/2015   Hypertension 02/22/2015   HLD (hyperlipidemia) 02/22/2015   Depression 02/22/2015    Orientation RESPIRATION BLADDER Height & Weight     Self  Normal Continent Weight: 154 lb 5.2 oz (70 kg) Height:     BEHAVIORAL SYMPTOMS/MOOD NEUROLOGICAL BOWEL NUTRITION STATUS  Verbally abusive  (Dementia) Continent Diet  AMBULATORY STATUS COMMUNICATION OF NEEDS Skin   Supervision Verbally Normal                       Personal Care Assistance Level of Assistance  Bathing, Feeding, Dressing Bathing Assistance: Limited assistance Feeding assistance: Independent Dressing Assistance: Limited assistance     Functional Limitations Info  Hearing   Hearing Info: Impaired      SPECIAL CARE FACTORS FREQUENCY                       Contractures Contractures Info: Not present    Additional Factors Info  Allergies   Allergies Info: Bee Venom; Honey Bee Venom           Current Medications (12/25/2023):  This is the current hospital active medication list Current Facility-Administered Medications  Medication Dose Route Frequency Provider Last Rate Last Admin   amLODipine  (NORVASC ) tablet 5 mg  5  mg Oral Daily Ernest Ronal BRAVO, MD   5 mg at 12/24/23 1214   divalproex  (DEPAKOTE ) DR tablet 250 mg  250 mg Oral BID Ernest Ronal BRAVO, MD   250 mg at 12/25/23 0116   levothyroxine  (SYNTHROID ) tablet 112 mcg  112 mcg Oral QAC breakfast Ernest Ronal BRAVO, MD   112 mcg at 12/25/23 0815   memantine  (NAMENDA ) tablet 10 mg  10 mg Oral BID Ernest Ronal BRAVO, MD   10 mg at 12/25/23 9882   midazolam  PF (VERSED )  injection 2 mg  2 mg Intramuscular Once Gordan Huxley, MD       OLANZapine  (ZYPREXA ) tablet 5 mg  5 mg Oral Q8H PRN Smith, Annie B, NP       Or   OLANZapine  (ZYPREXA ) injection 5 mg  5 mg Intramuscular Q8H PRN Smith, Annie B, NP   5 mg at 12/23/23 2135   OLANZapine  (ZYPREXA ) injection 5 mg  5 mg Intramuscular Once Bradler, Evan K, MD       QUEtiapine  (SEROQUEL ) tablet 100 mg  100 mg Oral QHS Smith, Annie B, NP   100 mg at 12/25/23 0118   QUEtiapine  (SEROQUEL ) tablet 50 mg  50 mg Oral Daily Smith, Annie B, NP   50 mg at 12/24/23 1213   rivastigmine  (EXELON ) capsule 3 mg  3 mg Oral BID Ernest Ronal BRAVO, MD   3 mg at 12/25/23 9882   rosuvastatin  (CRESTOR ) tablet 20 mg  20 mg Oral Daily Ernest Ronal BRAVO, MD   20 mg at 12/24/23 1216   sertraline  (ZOLOFT ) tablet 100 mg  100 mg Oral QHS Ernest Ronal BRAVO, MD   100 mg at 12/25/23 9882   traZODone  (DESYREL ) tablet 50 mg  50 mg Oral QHS Ernest Ronal BRAVO, MD   50 mg at 12/25/23 9882   zonisamide  (ZONEGRAN ) capsule 100 mg  100 mg Oral BID Ernest Ronal BRAVO, MD   100 mg at 12/25/23 0116   Current Outpatient Medications  Medication Sig Dispense Refill   acetaminophen  (TYLENOL ) 500 MG tablet Take 1,000 mg by mouth every 6 (six) hours as needed for moderate pain (pain score 4-6). (Patient taking differently: Take 500 mg by mouth every 6 (six) hours as needed for moderate pain (pain score 4-6).)     amLODipine  (NORVASC ) 5 MG tablet Take 1 tablet (5 mg total) by mouth daily. 30 tablet 3   divalproex  (DEPAKOTE ) 250 MG DR tablet Take 1 tablet (250 mg total) by mouth 2 (two) times daily. 180 tablet 3   levothyroxine  (SYNTHROID ) 112 MCG tablet Take 1 tablet (112 mcg total) by mouth daily before breakfast. 90 tablet 3   memantine  (NAMENDA ) 10 MG tablet Take 1 tablet (10 mg total) by mouth 2 (two) times daily. 180 tablet 3   Midazolam  (NAYZILAM ) 5 MG/0.1ML SOLN Place 5 mg into the nose as needed. 2 each 2   Multiple Vitamin (MULTIVITAMIN) tablet Take 1 tablet by mouth daily. 90 tablet  3   permethrin (ELIMITE) 5 % cream Apply from the neck down to soles of feet, wash off after 8-12 hours. 60 g 1   QUEtiapine  (SEROQUEL ) 50 MG tablet Take 2 tablets (100 mg total) by mouth in the morning. 180 tablet 1   rivastigmine  (EXELON ) 3 MG capsule Take 1 capsule (3 mg total) by mouth 2 (two) times daily. 180 capsule 3   rosuvastatin  (CRESTOR ) 20 MG tablet Take 1 tablet (20 mg total) by mouth daily. 90 tablet 3   sertraline  (ZOLOFT )  100 MG tablet Take 1 tablet (100 mg total) by mouth at bedtime. 90 tablet 3   traZODone  (DESYREL ) 50 MG tablet Take 1 tablet (50 mg total) by mouth at bedtime. 90 tablet 3   triamcinolone cream (KENALOG) 0.5 % Apply 1 Application topically 2 (two) times daily. 30 g 0   zonisamide  (ZONEGRAN ) 100 MG capsule Take 1 capsule (100 mg total) by mouth 2 (two) times daily. 180 capsule 1   Facility-Administered Medications Ordered in Other Encounters  Medication Dose Route Frequency Provider Last Rate Last Admin   denosumab  (XGEVA ) injection 120 mg  120 mg Subcutaneous Q30 days Rao, Archana C, MD   120 mg at 03/05/19 1525     Discharge Medications: Please see discharge summary for a list of discharge medications.  Relevant Imaging Results:  Relevant Lab Results:   Additional Information [id number]  Ky Moskowitz L Shynice Sigel, [name]

## 2023-12-25 NOTE — ED Notes (Signed)
 Pt has small abrasion left posterior forearm. Wound cleansed and dressed with nonadherent gauze and coban.

## 2023-12-25 NOTE — ED Notes (Signed)
 D/c instructions reviewed with husband of pt. Glasses given to husband. Pt assisted in car by EDT.

## 2023-12-25 NOTE — Progress Notes (Signed)
 Physical Therapy Treatment Patient Details Name: Anne [name] MRN: [id number] DOB: 1950-12-30 Today's Date: 12/25/2023   History of Present Illness Anne [name] is a 73 y.o. female with a past medical history of dementia, gastric reflux, hypertension, hyperlipidemia, presents to the emergency department for agitation. Pt has dementia at baseline.    PT Comments  Patient sleeping heavily upon arrival to session; awakens to voice/light touch with mod encouragement.  Remains oriented to self only; unaware of location, reason for admission.  Does follow simple, verbal commands with increased time approx 50-60% time, often requiring hand-over-hand for task initiation. Patient generally staggered and unsteady with gait efforts, necessitating min assist +1 at all times.  Demonstrates very limited functional reach with dynamic activities, requiring UE support throughout to maintain balance. Poor insight into deficits and overall limits of stability, safety needs  Lunch tray arrived end of session.  Patient sitting edge of bed to consume meal, using hand/fingers (versus utensils) to negotiate foods on ED tray.      If plan is discharge home, recommend the following: A little help with walking and/or transfers;A little help with bathing/dressing/bathroom;Assist for transportation;Direct supervision/assist for medications management;Direct supervision/assist for financial management;Supervision due to cognitive status   Can travel by private vehicle        Equipment Recommendations       Recommendations for Other Services       Precautions / Restrictions Precautions Precautions: Fall Recall of Precautions/Restrictions: Impaired Restrictions Weight Bearing Restrictions Per Provider Order: No     Mobility  Bed Mobility Overal bed mobility: Needs Assistance Bed Mobility: Supine to Sit     Supine to sit: Min assist     General bed mobility comments: sleeping heavily upon arrival to  room; hand-over-hand to initiate movement transition    Transfers Overall transfer level: Needs assistance Equipment used: 1 person hand held assist Transfers: Sit to/from Stand Sit to Stand: Min assist                Ambulation/Gait Ambulation/Gait assistance: Min Chemical Engineer (Feet):  (25' x2) Assistive device: 1 person hand held assist         General Gait Details: broad BOS, excessive weight shift and force of contact to R LE; choppy, unsteady gait pattern, requiring +1 hands-on at all times to maintain balance/safety   Stairs             Wheelchair Mobility     Tilt Bed    Modified Rankin (Stroke Patients Only)       Balance Overall balance assessment: Needs assistance Sitting-balance support: No upper extremity supported, Feet supported Sitting balance-Leahy Scale: Good     Standing balance support: Single extremity supported Standing balance-Leahy Scale: Fair                              Hotel Manager: Impaired Factors Affecting Communication: Hearing impaired  Cognition Arousal: Alert Behavior During Therapy: Flat affect   PT - Cognitive impairments: History of cognitive impairments, No family/caregiver present to determine baseline                       PT - Cognition Comments: Oriented to self only; does follow simple commands with increased time, encouragement Following commands: Impaired Following commands impaired: Follows one step commands inconsistently    Cueing Cueing Techniques: Verbal cues, Visual cues, Tactile cues, Gestural cues  Exercises Other Exercises Other  Exercises: Toilet transfer, ambulatory with L HHA, min assist +1; hand-over-hand to guide movement, negotiate pathway/direction.  Constant physical assist to maintain balance Other Exercises: Sit/stand from standard height toilet, min assist with grab bar; standing balance for clothing management and hand  hygiene, single UE support, cga/min assist.  Very limited functional reach, requiring UE support throughout to maintain balance. Poor insight into deficits and overall limits of stability, safety needs    General Comments        Pertinent Vitals/Pain Pain Assessment Pain Assessment: Faces Faces Pain Scale: No hurt    Home Living                          Prior Function            PT Goals (current goals can now be found in the care plan section) Acute Rehab PT Goals Patient Stated Goal: none stated PT Goal Formulation: Patient unable to participate in goal setting Time For Goal Achievement: 01/03/24 Potential to Achieve Goals: Fair Progress towards PT goals: Progressing toward goals    Frequency    Min 1X/week      PT Plan      Co-evaluation              AM-PAC PT 6 Clicks Mobility   Outcome Measure  Help needed turning from your back to your side while in a flat bed without using bedrails?: A Little Help needed moving from lying on your back to sitting on the side of a flat bed without using bedrails?: A Little Help needed moving to and from a bed to a chair (including a wheelchair)?: A Little Help needed standing up from a chair using your arms (e.g., wheelchair or bedside chair)?: A Little Help needed to walk in hospital room?: A Little Help needed climbing 3-5 steps with a railing? : A Little 6 Click Score: 18    End of Session   Activity Tolerance: Patient tolerated treatment well Patient left: in bed Nurse Communication: Mobility status PT Visit Diagnosis: Unsteadiness on feet (R26.81);Muscle weakness (generalized) (M62.81)     Time: 8752-8742 PT Time Calculation (min) (ACUTE ONLY): 10 min  Charges:    $Gait Training: 8-22 mins PT General Charges $$ ACUTE PT VISIT: 1 Visit                     Marae Cottrell H. Delores, PT, DPT, NCS 12/25/23, 2:37 PM [phone]

## 2023-12-25 NOTE — ED Notes (Signed)
 Pt sitting in hallway talking to engineer, materials.

## 2023-12-25 NOTE — ED Notes (Signed)
 Pt asked for assistance putting a band aid on bleeding wound on left arm. Pt took off previous bandage and I disposed in proper area. New bandage was placed and encouraged pt to lay down and try to sleep. No further needs at this time.

## 2023-12-25 NOTE — ED Provider Notes (Signed)
 Advised by transition of care team as well as ED nursing leadership team the patient is ready for discharge.  Please see note as below  TOC will assist in setting of home health and outpatient follow-up in the community.  Patient be discharged back to the care of her family   Per [facility] team:  DC to home. DSS does not have guardianship or guardianship pending. Spouse removed patient from facility because he didn't want to give up her finances. We can set up home health and DSS and [place] will follow up in the community.  DSS is aware of pending discharge.   Vitals:   12/25/23 0102 12/25/23 1053  BP: 110/73 131/83  Pulse: 70 66  Resp: 16 16  Temp: 98.1 F (36.7 C) 97.9 F (36.6 C)  SpO2: 94% 92%      Dicky Anes, MD 12/25/23 1453

## 2023-12-25 NOTE — Discharge Instructions (Addendum)
 Please follow-up very closely with community resources, recommend you also reach out to the [facility] [employer] [facility] moving forward, and follow-up closely with primary care.

## 2023-12-26 ENCOUNTER — Other Ambulatory Visit: Payer: Self-pay

## 2023-12-28 ENCOUNTER — Emergency Department

## 2023-12-28 ENCOUNTER — Other Ambulatory Visit: Payer: Self-pay

## 2023-12-28 ENCOUNTER — Inpatient Hospital Stay
Admission: EM | Admit: 2023-12-28 | Discharge: 2024-01-04 | DRG: 177 | Disposition: A | Discharge status: Home / Self Care | Attending: Internal Medicine | Admitting: Internal Medicine

## 2023-12-28 DIAGNOSIS — G309 Alzheimer's disease, unspecified: Secondary | ICD-10-CM | POA: Diagnosis present

## 2023-12-28 DIAGNOSIS — E039 Hypothyroidism, unspecified: Secondary | ICD-10-CM | POA: Diagnosis present

## 2023-12-28 DIAGNOSIS — K7460 Unspecified cirrhosis of liver: Secondary | ICD-10-CM | POA: Diagnosis present

## 2023-12-28 DIAGNOSIS — W44F3XA Food entering into or through a natural orifice, initial encounter: Secondary | ICD-10-CM | POA: Diagnosis present

## 2023-12-28 DIAGNOSIS — Z91030 Bee allergy status: Secondary | ICD-10-CM

## 2023-12-28 DIAGNOSIS — J690 Pneumonitis due to inhalation of food and vomit: Principal | ICD-10-CM | POA: Diagnosis present

## 2023-12-28 DIAGNOSIS — E440 Moderate protein-calorie malnutrition: Secondary | ICD-10-CM | POA: Diagnosis present

## 2023-12-28 DIAGNOSIS — T17928A Food in respiratory tract, part unspecified causing other injury, initial encounter: Secondary | ICD-10-CM | POA: Diagnosis present

## 2023-12-28 DIAGNOSIS — F0282 Dementia in other diseases classified elsewhere, unspecified severity, with psychotic disturbance: Secondary | ICD-10-CM | POA: Diagnosis present

## 2023-12-28 DIAGNOSIS — Z8041 Family history of malignant neoplasm of ovary: Secondary | ICD-10-CM

## 2023-12-28 DIAGNOSIS — Z823 Family history of stroke: Secondary | ICD-10-CM

## 2023-12-28 DIAGNOSIS — F0283 Dementia in other diseases classified elsewhere, unspecified severity, with mood disturbance: Secondary | ICD-10-CM | POA: Diagnosis present

## 2023-12-28 DIAGNOSIS — Z6824 Body mass index (BMI) 24.0-24.9, adult: Secondary | ICD-10-CM

## 2023-12-28 DIAGNOSIS — J9621 Acute and chronic respiratory failure with hypoxia: Secondary | ICD-10-CM | POA: Diagnosis present

## 2023-12-28 DIAGNOSIS — Z609 Problem related to social environment, unspecified: Secondary | ICD-10-CM | POA: Diagnosis present

## 2023-12-28 DIAGNOSIS — Z8249 Family history of ischemic heart disease and other diseases of the circulatory system: Secondary | ICD-10-CM

## 2023-12-28 DIAGNOSIS — Z87891 Personal history of nicotine dependence: Secondary | ICD-10-CM

## 2023-12-28 DIAGNOSIS — D696 Thrombocytopenia, unspecified: Secondary | ICD-10-CM | POA: Diagnosis present

## 2023-12-28 DIAGNOSIS — K219 Gastro-esophageal reflux disease without esophagitis: Secondary | ICD-10-CM | POA: Diagnosis present

## 2023-12-28 DIAGNOSIS — Z85118 Personal history of other malignant neoplasm of bronchus and lung: Secondary | ICD-10-CM

## 2023-12-28 DIAGNOSIS — M1990 Unspecified osteoarthritis, unspecified site: Secondary | ICD-10-CM | POA: Diagnosis present

## 2023-12-28 DIAGNOSIS — R54 Age-related physical debility: Secondary | ICD-10-CM | POA: Diagnosis present

## 2023-12-28 DIAGNOSIS — G40909 Epilepsy, unspecified, not intractable, without status epilepticus: Secondary | ICD-10-CM | POA: Diagnosis present

## 2023-12-28 DIAGNOSIS — Z79899 Other long term (current) drug therapy: Secondary | ICD-10-CM

## 2023-12-28 DIAGNOSIS — Z820 Family history of epilepsy and other diseases of the nervous system: Secondary | ICD-10-CM

## 2023-12-28 DIAGNOSIS — Z79890 Hormone replacement therapy (postmenopausal): Secondary | ICD-10-CM

## 2023-12-28 DIAGNOSIS — F02811 Dementia in other diseases classified elsewhere, unspecified severity, with agitation: Secondary | ICD-10-CM | POA: Diagnosis present

## 2023-12-28 DIAGNOSIS — T17908A Unspecified foreign body in respiratory tract, part unspecified causing other injury, initial encounter: Secondary | ICD-10-CM

## 2023-12-28 DIAGNOSIS — Z9049 Acquired absence of other specified parts of digestive tract: Secondary | ICD-10-CM

## 2023-12-28 DIAGNOSIS — C7951 Secondary malignant neoplasm of bone: Secondary | ICD-10-CM | POA: Diagnosis present

## 2023-12-28 DIAGNOSIS — H9190 Unspecified hearing loss, unspecified ear: Secondary | ICD-10-CM | POA: Diagnosis present

## 2023-12-28 DIAGNOSIS — I10 Essential (primary) hypertension: Secondary | ICD-10-CM | POA: Diagnosis present

## 2023-12-28 DIAGNOSIS — E785 Hyperlipidemia, unspecified: Secondary | ICD-10-CM | POA: Diagnosis present

## 2023-12-28 DIAGNOSIS — R0902 Hypoxemia: Principal | ICD-10-CM

## 2023-12-28 LAB — COMPREHENSIVE METABOLIC PANEL WITH GFR
ALT: 12 U/L (ref 0–44)
AST: 22 U/L (ref 15–41)
Albumin: 4.2 g/dL (ref 3.5–5.0)
Alkaline Phosphatase: 75 U/L (ref 38–126)
Anion gap: 11 (ref 5–15)
BUN: 16 mg/dL (ref 8–23)
CO2: 29 mmol/L (ref 22–32)
Calcium: 9 mg/dL (ref 8.9–10.3)
Chloride: 103 mmol/L (ref 98–111)
Creatinine, Ser: 0.84 mg/dL (ref 0.44–1.00)
GFR, Estimated: >60 mL/min (ref >60)
Glucose, Bld: 95 mg/dL (ref 70–99)
Potassium: 3.2 mmol/L — ABNORMAL LOW (ref 3.5–5.1)
Sodium: 143 mmol/L (ref 135–145)
Total Bilirubin: 0.3 mg/dL (ref 0.0–1.2)
Total Protein: 7.1 g/dL (ref 6.5–8.1)

## 2023-12-28 LAB — CBC WITH DIFFERENTIAL/PLATELET
Abs Immature Granulocytes: 0.01 K/uL (ref 0.00–0.07)
Basophils Absolute: 0 K/uL (ref 0.0–0.1)
Basophils Relative: 1 %
Eosinophils Absolute: 0.2 K/uL (ref 0.0–0.5)
Eosinophils Relative: 4 %
HCT: 42.2 % (ref 36.0–46.0)
Hemoglobin: 13.1 g/dL (ref 12.0–15.0)
Immature Granulocytes: 0 %
Lymphocytes Relative: 24 %
Lymphs Abs: 1.3 K/uL (ref 0.7–4.0)
MCH: 29 pg (ref 26.0–34.0)
MCHC: 31 g/dL (ref 30.0–36.0)
MCV: 93.4 fL (ref 80.0–100.0)
Monocytes Absolute: 0.4 K/uL (ref 0.1–1.0)
Monocytes Relative: 8 %
Neutro Abs: 3.4 K/uL (ref 1.7–7.7)
Neutrophils Relative %: 63 %
Platelets: 126 K/uL — ABNORMAL LOW (ref 150–400)
RBC: 4.52 MIL/uL (ref 3.87–5.11)
RDW: 16.1 % — ABNORMAL HIGH (ref 11.5–15.5)
WBC: 5.3 K/uL (ref 4.0–10.5)
nRBC: 0 % (ref 0.0–0.2)

## 2023-12-28 LAB — TROPONIN T, HIGH SENSITIVITY: Troponin T High Sensitivity: <15 ng/L (ref 0–19)

## 2023-12-28 MED ORDER — LORAZEPAM 2 MG PO TABS: 2.0000 mg | ORAL_TABLET | Freq: Once | ORAL | Status: AC

## 2023-12-28 MED ORDER — LORAZEPAM 2 MG/ML IJ SOLN
INTRAMUSCULAR | Status: AC
  Administered 2023-12-28: 2 mg via INTRAMUSCULAR
  Filled 2023-12-28: qty 1

## 2023-12-28 MED ORDER — LORAZEPAM 2 MG/ML IJ SOLN: 2.0000 mg | Freq: Once | INTRAMUSCULAR | Status: AC

## 2023-12-28 MED ORDER — LORAZEPAM 2 MG/ML IJ SOLN
2.0000 mg | Freq: Once | INTRAMUSCULAR | Status: AC
  Administered 2023-12-28: 2 mg via INTRAVENOUS
  Filled 2023-12-28: qty 1

## 2023-12-28 NOTE — ED Notes (Signed)
 Patient IV pulled loose from position in arm, spoke with MD Claudene who stated patient did not need an IV and damaged IV could be removed and patient did not need another IV placed at this time

## 2023-12-28 NOTE — ED Provider Notes (Signed)
-----------------------------------------   11:12 PM on 12/28/2023 -----------------------------------------  Blood pressure 117/68, pulse 81, temperature 97.8 F (36.6 C), temperature source Oral, resp. rate 18, height 1.702 m (5' 7), weight 70 kg, SpO2 97%.   Assuming care from Dr. Claudene.  In short, Anne [name] is a 73 y.o. female with a chief complaint of hypoxia in the setting of witnessed aspiration at home.  Refer to the original H&P for additional details.  The current plan of care is to further evaluate with a CT chest without contrast to look for evidence of developing pneumonitis.  Regardless, patient will require admission due to documented hypoxia down to the 70s and 80s after coughing episode while eating at home.  Of note, as otherwise documented in nursing assessment, the patient became combative with the nursing staff with some bizarre behavior and mistakenly received both 2 mg IV Ativan  and 2 mg intramuscular Ativan .  Please see nursing notes for additional details.  As result, patient is very somnolent right now.   Clinical Course as of 12/29/23 0013  Sat Dec 28, 2023  2058 Patient getting combative with nursing staff [DS]  2159 Reassessed, calming down, remains on room air [DS]  Sun Dec 29, 2023  0012 CT Chest Wo Contrast I independently viewed and interpreted the patient's CT chest and I see no obvious sign of pneumonia at this time although aspiration can develop pneumonitis over time.  Dr. Claudene consulted with Dr. Cleatus with the hospitalist service and she will admit the patient. [CF]    Clinical Course User Index [CF] Gordan Huxley, MD [DS] Claudene Rover, MD     Medications  LORazepam  (ATIVAN ) injection 2 mg (2 mg Intravenous Given 12/28/23 2101)  LORazepam  (ATIVAN ) tablet 2 mg ( Oral See Alternative 12/28/23 2114)    Or  LORazepam  (ATIVAN ) injection 2 mg (2 mg Intramuscular Given 12/28/23 2114)     ED Discharge Orders     None      Final diagnoses:   Hypoxia  Aspiration into airway, initial encounter     Gordan Huxley, MD 12/29/23 9986

## 2023-12-28 NOTE — ED Notes (Signed)
 Patient continues to fight staff and try to hit and kick and get out of bed

## 2023-12-28 NOTE — ED Provider Notes (Signed)
 [facility] Provider Note    Event Date/Time   First MD Initiated Contact with Patient 12/28/23 1959     (approximate)   History   Aspiration   HPI  Anne [name] is a 73 y.o. female who presents to the ED for evaluation of Aspiration    Patient with history of dementia presents to the ED after reported episode of aspiration.  Reports choking on food just prior to calling 911.  They find her coughing and intermittently desaturating to the 70s, after coughing she will then return to mid 90s.  Physical Exam   Triage Vital Signs: ED Triage Vitals  Encounter Vitals Group     BP 12/28/23 1951 138/75     Girls Systolic BP Percentile --      Girls Diastolic BP Percentile --      Boys Systolic BP Percentile --      Boys Diastolic BP Percentile --      Pulse Rate 12/28/23 1951 70     Resp 12/28/23 1951 (!) 21     Temp 12/28/23 1951 97.8 F (36.6 C)     Temp Source 12/28/23 1951 Oral     SpO2 12/28/23 1951 92 %     Weight 12/28/23 1952 154 lb 5.2 oz (70 kg)     Height 12/28/23 1952 5' 7 (1.702 m)     Head Circumference --      Peak Flow --      Pain Score 12/28/23 1952 0     Pain Loc --      Pain Education --      Exclude from Growth Chart --     Most recent vital signs: Vitals:   12/28/23 2322 12/28/23 2340  BP:    Pulse: 74   Resp:    Temp:  (!) 97.3 F (36.3 C)  SpO2: 100%     General: Awake, no distress.  CV:  Good peripheral perfusion.  Resp:  Normal effort.  Wet-sounding cough without distress or tripoding Abd:  No distention.  MSK:  No deformity noted.  Neuro:  No focal deficits appreciated. Other:     ED Results / Procedures / Treatments   Labs (all labs ordered are listed, but only abnormal results are displayed) Labs Reviewed  COMPREHENSIVE METABOLIC PANEL WITH GFR - Abnormal; Notable for the following components:      Result Value   Potassium 3.2 (*)    All other components within normal limits  CBC WITH  DIFFERENTIAL/PLATELET - Abnormal; Notable for the following components:   RDW 16.1 (*)    Platelets 126 (*)    All other components within normal limits  TROPONIN T, HIGH SENSITIVITY  TROPONIN T, HIGH SENSITIVITY    EKG Sinus rhythm with a rate of 77 bpm.  Normal axis and intervals.  No clear signs of acute ischemia.  RADIOLOGY CXR interpreted by me without evidence of acute cardiopulmonary pathology.  Official radiology report(s): CT Chest Wo Contrast Result Date: 12/28/2023 EXAM: CT CHEST WITHOUT CONTRAST 12/28/2023 11:30:00 PM TECHNIQUE: CT of the chest was performed without the administration of intravenous contrast. Multiplanar reformatted images are provided for review. Automated exposure control, iterative reconstruction, and/or weight based adjustment of the mA/kV was utilized to reduce the radiation dose to as low as reasonably achievable. COMPARISON: 01/01/2022, 06/02/2023, plain film from earlier in the same day. CLINICAL HISTORY: Recent choking episode. FINDINGS: MEDIASTINUM: Heart: Coronary calcifications are seen. No cardiac enlargement is noted. The central airways  are clear. The thoracic aorta shows atherosclerotic calcifications without aneurysmal dilatation. The thoracic inlet is within normal limits. The esophagus, as visualized, is within normal limits. LYMPH NODES: No mediastinal, hilar or axillary lymphadenopathy. LUNGS AND PLEURA: Lungs are well aerated bilaterally. Minimal basilar atelectasis is seen. A few small nodules are noted the largest of which lies on image number 47 of series 4. This is stable from the prior exam as well as multiple previous exams dating back to at least 2023 consistent with benign etiology. No further follow-up is recommended. No new sizable parenchymal nodules are seen. No pleural effusion or pneumothorax. SOFT TISSUES/BONES: Degenerative changes of the thoracic spine are seen. No acute rib abnormality is noted. Right chest wall port is again  noted. UPPER ABDOMEN: Limited images of the upper abdomen demonstrates no acute abnormality. IMPRESSION: 1. No acute cardiopulmonary findings to explain the recent choking episode. Electronically signed by: Oneil Devonshire MD 12/28/2023 11:45 PM EST RP Workstation: [id number]   DG Chest 2 View Result Date: 12/28/2023 EXAM: 2 VIEW(S) XRAY OF THE CHEST 12/28/2023 08:15:00 PM COMPARISON: 06/02/2023 CLINICAL HISTORY: aspiration, eval infiltrate FINDINGS: LINES, TUBES AND DEVICES: Right port-a-cath tip is in the SVC, unchanged. LUNGS AND PLEURA: No focal pulmonary opacity. No pleural effusion. No pneumothorax. HEART AND MEDIASTINUM: Aortic atherosclerosis. No acute abnormality of the cardiac silhouette. BONES AND SOFT TISSUES: No acute osseous abnormality. IMPRESSION: 1. No acute cardiopulmonary process. Electronically signed by: Franky Crease MD 12/28/2023 08:38 PM EST RP Workstation: [id number]    PROCEDURES and INTERVENTIONS:  .Critical Care  Performed by: Claudene Rover, MD Authorized by: Claudene Rover, MD   Critical care provider statement:    Critical care time (minutes):  30   Critical care time was exclusive of:  Separately billable procedures and treating other patients   Critical care was necessary to treat or prevent imminent or life-threatening deterioration of the following conditions:  Respiratory failure and toxidrome   Critical care was time spent personally by me on the following activities:  Development of treatment plan with patient or surrogate, discussions with consultants, evaluation of patient's response to treatment, examination of patient, ordering and review of laboratory studies, ordering and review of radiographic studies, ordering and performing treatments and interventions, pulse oximetry, re-evaluation of patient's condition and review of old charts   Medications  LORazepam  (ATIVAN ) injection 2 mg (2 mg Intravenous Given 12/28/23 2101)  LORazepam  (ATIVAN ) tablet 2 mg ( Oral See  Alternative 12/28/23 2114)    Or  LORazepam  (ATIVAN ) injection 2 mg (2 mg Intramuscular Given 12/28/23 2114)     IMPRESSION / MDM / ASSESSMENT AND PLAN / ED COURSE  I reviewed the triage vital signs and the nursing notes.  Differential diagnosis includes, but is not limited to, aspiration pneumonitis, pneumonia, pneumothorax  {Patient presents with symptoms of an acute illness or injury that is potentially life-threatening.  Patient presents to the ED with episodes of hypoxia associated with coughing fits concerning for aspiration and requiring medical admission for pulmonary toilet.  Initially attempted to keep the patient on room air but she desaturates to the low 80s and requires 2-3 L nasal cannula.  No clear infiltrates on x-ray, reassuring blood work.  Consult medicine for admission due to hypoxia  She does have a history of dementia with behavioral disturbance.  At 1 point she briefly becomes combative with staff and requires Ativan  with improvement of her behavior  Clinical Course as of 12/28/23 2349  Sat Dec 28, 2023  2058 Patient getting  combative with nursing staff [DS]  2159 Reassessed, calming down, remains on room air [DS]    Clinical Course User Index [DS] Claudene Rover, MD     FINAL CLINICAL IMPRESSION(S) / ED DIAGNOSES   Final diagnoses:  Hypoxia  Aspiration into airway, initial encounter     Rx / DC Orders   ED Discharge Orders     None        Note:  This document was prepared using Dragon voice recognition software and may include unintentional dictation errors.   Claudene Rover, MD 12/28/23 [id number]

## 2023-12-28 NOTE — ED Triage Notes (Signed)
 Bib ems for c/o aspiration. Pt coming from home and was eating dinner tonight when she choked on her food.

## 2023-12-28 NOTE — ED Notes (Signed)
 Patient transported to CT

## 2023-12-29 DIAGNOSIS — F02C11 Dementia in other diseases classified elsewhere, severe, with agitation: Secondary | ICD-10-CM

## 2023-12-29 DIAGNOSIS — Z609 Problem related to social environment, unspecified: Secondary | ICD-10-CM | POA: Diagnosis not present

## 2023-12-29 DIAGNOSIS — G309 Alzheimer's disease, unspecified: Secondary | ICD-10-CM | POA: Diagnosis not present

## 2023-12-29 DIAGNOSIS — J690 Pneumonitis due to inhalation of food and vomit: Secondary | ICD-10-CM | POA: Diagnosis not present

## 2023-12-29 DIAGNOSIS — J9601 Acute respiratory failure with hypoxia: Secondary | ICD-10-CM | POA: Diagnosis not present

## 2023-12-29 LAB — BASIC METABOLIC PANEL WITH GFR
Anion gap: 9 (ref 5–15)
BUN: 12 mg/dL (ref 8–23)
CO2: 27 mmol/L (ref 22–32)
Calcium: 8.6 mg/dL — ABNORMAL LOW (ref 8.9–10.3)
Chloride: 108 mmol/L (ref 98–111)
Creatinine, Ser: 0.69 mg/dL (ref 0.44–1.00)
GFR, Estimated: >60 mL/min (ref >60)
Glucose, Bld: 95 mg/dL (ref 70–99)
Potassium: 3.8 mmol/L (ref 3.5–5.1)
Sodium: 144 mmol/L (ref 135–145)

## 2023-12-29 LAB — CBC
HCT: 38.7 % (ref 36.0–46.0)
Hemoglobin: 12.2 g/dL (ref 12.0–15.0)
MCH: 29.3 pg (ref 26.0–34.0)
MCHC: 31.5 g/dL (ref 30.0–36.0)
MCV: 92.8 fL (ref 80.0–100.0)
Platelets: 123 K/uL — ABNORMAL LOW (ref 150–400)
RBC: 4.17 MIL/uL (ref 3.87–5.11)
RDW: 15.9 % — ABNORMAL HIGH (ref 11.5–15.5)
WBC: 7.4 K/uL (ref 4.0–10.5)
nRBC: 0 % (ref 0.0–0.2)

## 2023-12-29 LAB — TROPONIN T, HIGH SENSITIVITY: Troponin T High Sensitivity: <15 ng/L (ref 0–19)

## 2023-12-29 MED ORDER — DIVALPROEX SODIUM 250 MG PO DR TAB
250.0000 mg | DELAYED_RELEASE_TABLET | Freq: Two times a day (BID) | ORAL | Status: DC
  Administered 2023-12-30 – 2024-01-04 (×11): 250 mg via ORAL
  Filled 2023-12-29 (×11): qty 1

## 2023-12-29 MED ORDER — ROSUVASTATIN CALCIUM 20 MG PO TABS
20.0000 mg | ORAL_TABLET | Freq: Every day | ORAL | Status: DC
  Administered 2023-12-30 – 2024-01-04 (×6): 20 mg via ORAL
  Filled 2023-12-29 (×9): qty 1

## 2023-12-29 MED ORDER — SODIUM CHLORIDE 0.9 % IV SOLN: INTRAVENOUS | Status: AC

## 2023-12-29 MED ORDER — LEVOTHYROXINE SODIUM 112 MCG PO TABS
112.0000 ug | ORAL_TABLET | Freq: Every day | ORAL | Status: DC
  Administered 2023-12-31 – 2024-01-04 (×4): 112 ug via ORAL
  Filled 2023-12-29 (×7): qty 1

## 2023-12-29 MED ORDER — ENOXAPARIN SODIUM 40 MG/0.4ML IJ SOSY
40.0000 mg | PREFILLED_SYRINGE | INTRAMUSCULAR | Status: DC
  Administered 2023-12-29 – 2024-01-04 (×7): 40 mg via SUBCUTANEOUS
  Filled 2023-12-29 (×7): qty 0.4

## 2023-12-29 MED ORDER — ZONISAMIDE 100 MG PO CAPS
100.0000 mg | ORAL_CAPSULE | Freq: Two times a day (BID) | ORAL | Status: DC
  Administered 2023-12-30 – 2024-01-04 (×10): 100 mg via ORAL
  Filled 2023-12-29 (×14): qty 1

## 2023-12-29 MED ORDER — ACETAMINOPHEN 325 MG PO TABS
650.0000 mg | ORAL_TABLET | Freq: Four times a day (QID) | ORAL | Status: DC | PRN
  Administered 2023-12-29: 650 mg via ORAL
  Filled 2023-12-29: qty 2

## 2023-12-29 MED ORDER — MUSCLE RUB 10-15 % EX CREA: 1.0000 | TOPICAL_CREAM | CUTANEOUS | Status: DC | PRN

## 2023-12-29 MED ORDER — SERTRALINE HCL 50 MG PO TABS
100.0000 mg | ORAL_TABLET | Freq: Every day | ORAL | Status: DC
  Administered 2023-12-30 – 2024-01-03 (×5): 100 mg via ORAL
  Filled 2023-12-29 (×5): qty 2

## 2023-12-29 MED ORDER — LORAZEPAM 2 MG/ML IJ SOLN
2.0000 mg | Freq: Three times a day (TID) | INTRAMUSCULAR | Status: DC | PRN
  Administered 2023-12-31 – 2024-01-03 (×3): 2 mg via INTRAVENOUS
  Filled 2023-12-29 (×4): qty 1

## 2023-12-29 MED ORDER — ALBUTEROL SULFATE (2.5 MG/3ML) 0.083% IN NEBU: 2.5000 mg | INHALATION_SOLUTION | RESPIRATORY_TRACT | Status: DC | PRN

## 2023-12-29 MED ORDER — QUETIAPINE FUMARATE 25 MG PO TABS
100.0000 mg | ORAL_TABLET | Freq: Every morning | ORAL | Status: DC
  Administered 2023-12-30 – 2024-01-04 (×5): 100 mg via ORAL
  Filled 2023-12-29 (×7): qty 4

## 2023-12-29 MED ORDER — ONDANSETRON HCL 4 MG/2ML IJ SOLN: 4.0000 mg | Freq: Four times a day (QID) | INTRAMUSCULAR | Status: DC | PRN

## 2023-12-29 MED ORDER — TRAZODONE HCL 50 MG PO TABS
50.0000 mg | ORAL_TABLET | Freq: Every day | ORAL | Status: DC
  Administered 2023-12-30 – 2024-01-03 (×5): 50 mg via ORAL
  Filled 2023-12-29 (×5): qty 1

## 2023-12-29 MED ORDER — HALOPERIDOL LACTATE 5 MG/ML IJ SOLN: 1.0000 mg | Freq: Four times a day (QID) | INTRAMUSCULAR | Status: DC | PRN

## 2023-12-29 MED ORDER — SODIUM CHLORIDE 0.9 % IV SOLN
500.0000 mg | INTRAVENOUS | Status: DC
  Administered 2023-12-29: 500 mg via INTRAVENOUS
  Filled 2023-12-29: qty 5

## 2023-12-29 MED ORDER — RIVASTIGMINE TARTRATE 3 MG PO CAPS
3.0000 mg | ORAL_CAPSULE | Freq: Two times a day (BID) | ORAL | Status: DC
Start: 2023-12-29 — End: 2024-01-04
  Administered 2023-12-30 – 2024-01-04 (×10): 3 mg via ORAL
  Filled 2023-12-29 (×14): qty 1

## 2023-12-29 MED ORDER — MEMANTINE HCL 5 MG PO TABS
10.0000 mg | ORAL_TABLET | Freq: Two times a day (BID) | ORAL | Status: DC
  Administered 2023-12-30 – 2024-01-04 (×11): 10 mg via ORAL
  Filled 2023-12-29 (×12): qty 2

## 2023-12-29 MED ORDER — SALINE SPRAY 0.65 % NA SOLN: 1.0000 | NASAL | Status: DC | PRN

## 2023-12-29 MED ORDER — SODIUM CHLORIDE 0.9 % IV SOLN
2.0000 g | INTRAVENOUS | Status: DC
  Administered 2023-12-29: 2 g via INTRAVENOUS
  Filled 2023-12-29: qty 20

## 2023-12-29 MED ORDER — ONDANSETRON HCL 4 MG PO TABS: 4.0000 mg | ORAL_TABLET | Freq: Four times a day (QID) | ORAL | Status: DC | PRN

## 2023-12-29 MED ORDER — ACETAMINOPHEN 650 MG RE SUPP
650.0000 mg | Freq: Four times a day (QID) | RECTAL | Status: DC | PRN
Start: 2023-12-29 — End: 2024-01-04

## 2023-12-29 MED ORDER — HALOPERIDOL LACTATE 5 MG/ML IJ SOLN
2.0000 mg | Freq: Four times a day (QID) | INTRAMUSCULAR | Status: DC | PRN
  Administered 2023-12-29 – 2024-01-03 (×4): 2 mg via INTRAVENOUS
  Filled 2023-12-29 (×6): qty 1

## 2023-12-29 NOTE — H&P (Signed)
 History and Physical    Patient: Anne [name] FMW:[id number] DOB: 1950-05-24 DOA: 12/28/2023 DOS: the patient was seen and examined on 12/29/2023 PCP: Gretel App, NP  Patient coming from: Home  Chief Complaint:  Chief Complaint  Patient presents with   Aspiration   HPI: Anne [name] is a 73 y.o. 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  She presents to the ED on 11/22 after reported episode of aspiration.  Patient was initially found to be choking on food and desaturating to the 70s.  After excessive coughing her O2 sats returned to the 90s. On arrival to the ED she continued to desat into the 80s and required 2 to 3 L nasal cannula.  No infiltrates on CXR.  All labs.  CT chest without acute cardiopulmonary findings. She became combative and agitated and required Ativan .  At the time my evaluation she is somnolent.  She is fidgeting with her hands but does not open her eyes to evaluation, does not follow commands.  No family at bedside.   Review of Systems: unable to review all systems due to the inability of the patient to answer questions. 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), in remission, 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   Metastatic bone cancer    Seizures (HCC)    Subdural hematoma (HCC)    Past Surgical History:  Procedure Laterality Date   APPENDECTOMY  1971   CHOLECYSTECTOMY  1971   COLONOSCOPY WITH PROPOFOL  N/A 11/15/2016   Procedure: COLONOSCOPY WITH PROPOFOL ;  Surgeon: Therisa Bi, MD;  Location: [facility] ENDOSCOPY;  Service: Gastroenterology;  Laterality: N/A;   ENDOBRONCHIAL ULTRASOUND  Right 07/30/2018   Procedure: ENDOBRONCHIAL ULTRASOUND;  Surgeon: Verdia Art, MD;  Location: [facility] ORS;  Service: Pulmonary;  Laterality: Right;   ESOPHAGOGASTRODUODENOSCOPY (EGD) WITH PROPOFOL  N/A 01/07/2017   Procedure: ESOPHAGOGASTRODUODENOSCOPY (EGD) WITH PROPOFOL ;  Surgeon: Therisa Bi, MD;  Location: [facility] ENDOSCOPY;  Service: Gastroenterology;  Laterality: N/A;   LAPAROSCOPY N/A 03/01/2017   Procedure: LAPAROSCOPY DIAGNOSTIC;  Surgeon: Dessa Reyes ORN, MD;  Location: [facility] ORS;  Service: General;  Laterality: N/A;   PORTA CATH INSERTION N/A 08/14/2018   Procedure: PORTA CATH INSERTION;  Surgeon: Marea Selinda RAMAN, MD;  Location: [facility] INVASIVE CV LAB;  Service: Cardiovascular;  Laterality: N/A;   TONSILECTOMY, ADENOIDECTOMY, BILATERAL MYRINGOTOMY AND TUBES  1955   TONSILLECTOMY     VENTRAL HERNIA REPAIR N/A 03/01/2017   10 x 14 CM Ventralight ST mesh, intraperitoneal location.    VENTRAL HERNIA REPAIR N/A 03/01/2017   Procedure: HERNIA REPAIR VENTRAL ADULT;  Surgeon: Dessa Reyes ORN, MD;  Location: [facility] ORS;  Service: General;  Laterality: N/A;   Social History:  reports that she quit smoking about 11 years ago. Her smoking use included cigarettes and e-cigarettes. She started smoking about 61 years ago. She has a 100 pack-year smoking history. She has never used smokeless tobacco. She reports that she does not drink alcohol and does not use drugs.  Allergies  Allergen Reactions   Bee Venom Swelling   Honey Bee Venom Other (See Comments)    Unknown rxn    Family History  Problem Relation Age of Onset   Hypertension Mother    Ovarian cancer Mother 10   Dementia Mother    Heart disease Father    Stroke Father    Seizures Father    Ovarian cancer Sister        ? dx cancer had hyst.   Breast cancer Neg Hx     Prior to Admission medications   Medication Sig Start Date End Date Taking? Authorizing Provider  acetaminophen  (TYLENOL ) 500 MG tablet Take 1,000 mg by mouth every 6  (six) hours as needed for moderate pain (pain score 4-6). Patient taking differently: Take 500 mg by mouth every 6 (six) hours as needed for moderate pain (pain score 4-6).   Yes [provider]  amLODipine  (NORVASC ) 5 MG tablet Take 1 tablet (5 mg total) by mouth daily. 10/24/23  Yes Gretel App, NP  divalproex  (DEPAKOTE ) 250 MG DR tablet Take 1 tablet (250 mg total) by mouth 2 (two) times daily. 11/14/23  Yes Camara, Amadou, MD  levothyroxine  (SYNTHROID ) 112 MCG tablet Take 1 tablet (112 mcg total) by mouth daily before breakfast. 10/11/23  Yes Gretel App, NP  memantine  (NAMENDA ) 10 MG tablet Take 1 tablet (10 mg total) by mouth 2 (two) times daily. 11/14/23  Yes Camara, Pastor, MD  Midazolam  (NAYZILAM ) 5 MG/0.1ML SOLN Place 5 mg into the nose as needed. 03/08/23  Yes Camara, Amadou, MD  Multiple Vitamin (MULTIVITAMIN) tablet Take 1 tablet by mouth daily. 10/11/23  Yes Gretel App, NP  permethrin  (ELIMITE ) 5 % cream Apply from the neck down to soles of feet, wash off after 8-12 hours. 12/10/23  Yes Gretel App, NP  QUEtiapine  (SEROQUEL ) 50 MG tablet Take 2 tablets (100 mg total) by mouth in the morning. 09/27/23  Yes Gretel App, NP  rivastigmine  (EXELON ) 3 MG capsule Take 1 capsule (3 mg total) by mouth 2 (two) times daily. 11/14/23 11/08/24 Yes Camara, Amadou, MD  rosuvastatin  (CRESTOR ) 20 MG tablet Take 1 tablet (20 mg total) by mouth daily. 09/27/23  Yes Gretel App, NP  sertraline  (ZOLOFT ) 100 MG tablet Take 1 tablet (100 mg total) by mouth at bedtime. 09/27/23  Yes Gretel App, NP  traZODone  (DESYREL ) 50 MG tablet Take 1 tablet (50 mg total) by mouth at bedtime. 09/27/23  Yes Gretel App, NP  triamcinolone  cream (KENALOG ) 0.5 % Apply 1 Application topically 2 (two) times daily. 11/21/23  Yes Bedsole, Amy E, MD  zonisamide  (ZONEGRAN ) 100 MG capsule Take 1 capsule (100 mg total) by mouth 2 (two) times daily. 09/27/23  Yes Gretel App, NP    Physical Exam: Vitals:   12/29/23 0500 12/29/23  0509 12/29/23 0600 12/29/23 0700  BP: 106/62  (!) 100/54 (!) 104/59  Pulse: 87  87 88  Resp: (!) 22  (!) 24 (!) 21  Temp:  98.9 F (37.2 C)    TempSrc:  Oral    SpO2: 100%  99% 99%  Weight:      Height:       Constitutional:  Normal appearance. Non toxic-appearing.  HENT: Head Normocephalic and atraumatic.  Mucous membranes are moist.  Cardiovascular: Rate and Rhythm: Normal rate and regular rhythm.  Pulmonary: Non labored, symmetric rise of chest wall.  Intermittently tachypneic, nasal cannula displaced.  99% on room air Neurological: Somnolent, does not participate in evaluation, does not open eyes, is fidgeting with hands  Data Reviewed:    Latest Ref Rng & Units 12/29/2023    5:13 AM 12/28/2023  8:03 PM 12/19/2023    8:14 PM  CBC  WBC 4.0 - 10.5 K/uL 7.4  5.3  4.3   Hemoglobin 12.0 - 15.0 g/dL 87.7  86.8  86.9   Hematocrit 36.0 - 46.0 % 38.7  42.2  41.1   Platelets 150 - 400 K/uL 123  126  149       Latest Ref Rng & Units 12/29/2023    5:13 AM 12/28/2023    8:03 PM 12/19/2023    8:14 PM  BMP  Glucose 70 - 99 mg/dL 95  95  892   BUN 8 - 23 mg/dL 12  16  20    Creatinine 0.44 - 1.00 mg/dL 9.30  9.15  9.10   Sodium 135 - 145 mmol/L 144  143  142   Potassium 3.5 - 5.1 mmol/L 3.8  3.2  3.7   Chloride 98 - 111 mmol/L 108  103  105   CO2 22 - 32 mmol/L 27  29  29    Calcium  8.9 - 10.3 mg/dL 8.6  9.0  9.3    CT Chest Wo Contrast EXAM: CT CHEST WITHOUT CONTRAST 12/28/2023 11:30:00 PM  TECHNIQUE: CT of the chest was performed without the administration of intravenous contrast. Multiplanar reformatted images are provided for review. Automated exposure control, iterative reconstruction, and/or weight based adjustment of the mA/kV was utilized to reduce the radiation dose to as low as reasonably achievable.  COMPARISON: 01/01/2022, 06/02/2023, plain film from earlier in the same day.  CLINICAL HISTORY: Recent choking episode.  FINDINGS:  MEDIASTINUM: Heart:  Coronary calcifications are seen. No cardiac enlargement is noted. The central airways are clear. The thoracic aorta shows atherosclerotic calcifications without aneurysmal dilatation. The thoracic inlet is within normal limits. The esophagus, as visualized, is within normal limits.  LYMPH NODES: No mediastinal, hilar or axillary lymphadenopathy.  LUNGS AND PLEURA: Lungs are well aerated bilaterally. Minimal basilar atelectasis is seen. A few small nodules are noted the largest of which lies on image number 47 of series 4. This is stable from the prior exam as well as multiple previous exams dating back to at least 2023 consistent with benign etiology. No further follow-up is recommended. No new sizable parenchymal nodules are seen. No pleural effusion or pneumothorax.  SOFT TISSUES/BONES: Degenerative changes of the thoracic spine are seen. No acute rib abnormality is noted. Right chest wall port is again noted.  UPPER ABDOMEN: Limited images of the upper abdomen demonstrates no acute abnormality.  IMPRESSION: 1. No acute cardiopulmonary findings to explain the recent choking episode.  Electronically signed by: Oneil Devonshire MD 12/28/2023 11:45 PM EST RP Workstation: [id number] DG Chest 2 View EXAM: 2 VIEW(S) XRAY OF THE CHEST 12/28/2023 08:15:00 PM  COMPARISON: 06/02/2023  CLINICAL HISTORY: aspiration, eval infiltrate  FINDINGS:  LINES, TUBES AND DEVICES: Right port-a-cath tip is in the SVC, unchanged.  LUNGS AND PLEURA: No focal pulmonary opacity. No pleural effusion. No pneumothorax.  HEART AND MEDIASTINUM: Aortic atherosclerosis. No acute abnormality of the cardiac silhouette.  BONES AND SOFT TISSUES: No acute osseous abnormality.  IMPRESSION: 1. No acute cardiopulmonary process.  Electronically signed by: Franky Crease MD 12/28/2023 08:38 PM EST RP Workstation: [id number]   Assessment and Plan: Aspiration event Acute hypoxic respiratory failure - No  infiltrates on CXR or CT - Resented with hypoxia and tachypnea. - At the time of my evaluation patient is intermittently tachypneic but saturating 99% on room air. - Currently on ceftriaxone  and azithromycin  for community-acquired pneumonia coverage - Given no  fever, leukocytosis, or infiltrates on CXR.  Will discontinue these antibiotics for now - Proceed with supportive treatment for presumed pneumonitis - Mucinex  DM as needed - I-S and FV - Supplemental O2 if needed. - Low threshold to repeat CXR if clinical picture changes  Advanced Alzheimer's dementia - Complicated by hallucinations and agitation.  Has history of becoming aggressive with bedside staff.  Required Ativan  prior to my evaluation.  Somnolent at the time of my evaluation - Patient has been evaluated by psychiatry team last week while she was boarding in the ED. - Resume home meds for now.  Meant to be on Depakote , Namenda , Exelon , Seroquel .  QTc on arrival 440 - She follows outpatient with Dr. Gregg from neurology - Haldol  and Ativan  remain available PRN  Complex social situation - Patient in the ED 11/13- 11/19 Awaiting placement - Per TOC team: DSS does not have guardianship or guardianship pending at this time.  Spouse has previously remove the patient from facility because he did not want to give up finances.  Home health and DSS were arranged prior to DC - Reportedly patient's spouse should not be involved in decision making at this time - PT/OT ordered for now - Patient will need transition to long-term care facility.  She is not safe at home - [employer] consult  Hypothyroidism - Continue levothyroxine   GERD - Continue home meds  Cirrhosis of the liver - All labs WNL at this time.  Lung cancer with bony metastasis, in remission - Follows outpatient with oncology Dr. Melanee.  Continue outpatient follow-up.  Currently under surveillance of lung cancer - Some small nodule seen on CT on arrival, reportedly unchanged  from 2023.  Chronic thrombocytopenia - Likely secondary to cirrhosis.  Platelets stable.  No active bleeding.   Advance Care Planning:   Code Status: Full Code default to full code at this time.  No family members available for discussion.  Patient is not able to participate in discussion due to advanced dementia and current somnolence  Consults: n/a  Family Communication: No family present at bedside.  Per recent [facility] and psychiatry evaluation notes, husband is not meant to be involved in decision making.  DSS case ongoing.  Severity of Illness: The appropriate patient status for this patient is INPATIENT. Inpatient status is judged to be reasonable and necessary in order to provide the required intensity of service to ensure the patient's safety. The patient's presenting symptoms, physical exam findings, and initial radiographic and laboratory data in the context of their chronic comorbidities is felt to place them at high risk for further clinical deterioration. Furthermore, it is not anticipated that the patient will be medically stable for discharge from the hospital within 2 midnights of admission.   * I certify that at the point of admission it is my clinical judgment that the patient will require inpatient hospital care spanning beyond 2 midnights from the point of admission due to high intensity of service, high risk for further deterioration and high frequency of surveillance required.*  Author: Saadiq Poche, DO 12/29/2023 7:34 AM  For on call review [email or web address].

## 2023-12-29 NOTE — ED Notes (Signed)
 Pt continues to rest. Remains on VS monitor. VS updated. IVF ongoing.

## 2023-12-29 NOTE — ED Notes (Signed)
 Pt currently laying in bed, covered herself with blanket and sleeping. Will continue to monitor.

## 2023-12-29 NOTE — ED Notes (Signed)
 Pt tearful wishing to go home. This tech educated pt on the reason for needing to stay in the hospital. Pt understandable at this time and has calmed down.

## 2023-12-29 NOTE — Evaluation (Signed)
 Occupational Therapy Evaluation Patient Details Name: Anne [name] MRN: [id number] DOB: 1950/02/25 Today's Date: 12/29/2023   History of Present Illness   Anne [name] is a 73 y.o. 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  She presents to the ED on 11/22 after reported episode of aspiration.  Patient was initially found to be choking on food and desaturating to the 70s.  After excessive coughing her O2 sats returned to the 90s.  On arrival to the ED she continued to desat into the 80s and required 2 to 3 L nasal cannula.     Clinical Impressions Pt was seen for OT evaluation this date. On arrival to room pt in heavy sleep, awakes enough to say a few words that were unclear. Pt unable to provided updated PLOF/home setup. Author attempted to reach pt husband but no answer. Per chart review from recent previous admission 12/24/2023, PTA pt was MODI with amb often holding onto family members and family holding patient's arm for steadying. Per previous evaluation and chart review, pt recieves supervision from spouse for safety. Pt is able to perform ADLs with some assistance. Pt lived with her husband. Pt presents with deficits in decreased Ind in self care, balance, functional mobility/transfers and safety awareness affecting safe and optimal ADL completion. Pt currently requires TOTAL-MAXA+2 for all mobility and ADL completion, with that being said at the time of this eval pt was lethargic with minimal eyes opening. She verbalized some mumbles but not able to be understood. Will continue to assess pt abilities regarding ADLs and functional mobility as per conversation her independence waxes and wanes correlating with her cognition and alertness. Pt would benefit from skilled OT services to address noted impairments and  functional limitations (see below for any additional details) in order to maximize safety and independence while minimizing future risk of falls, injury, and readmission. OT will follow acutely.    If plan is discharge home, recommend the following:   A lot of help with walking and/or transfers;A lot of help with bathing/dressing/bathroom;Assist for transportation;Direct supervision/assist for medications management;Direct supervision/assist for financial management;Supervision due to cognitive status;Assistance with cooking/housework;Assistance with feeding     Functional Status Assessment   Patient has had a recent decline in their functional status and demonstrates the ability to make significant improvements in function in a reasonable and predictable amount of time.     Equipment Recommendations   Other (comment) (Defer to next venue of care)     Recommendations for Other Services         Precautions/Restrictions   Precautions Precautions: Fall Recall of Precautions/Restrictions: Impaired Restrictions Weight Bearing Restrictions Per Provider Order: No     Mobility Bed Mobility Overal bed mobility: Needs Assistance Bed Mobility: Supine to Sit, Sit to Supine     Supine to sit: Max assist, +2 for safety/equipment Sit to supine: Max assist, +2 for safety/equipment   General bed mobility comments: Heavy MAXA+2 for bed mobility to rise from bed to change linens and gown per pt soiled on arrival.    Transfers Overall transfer level: Needs assistance Equipment used: 2 person hand held assist Transfers: Sit to/from Stand Sit to Stand: Max assist, +2 safety/equipment           General transfer comment: STS from low ED bed with MAXA+2, knee blocking      Balance Overall balance assessment: Needs assistance Sitting-balance  support: Feet supported, Bilateral upper extremity supported Sitting balance-Leahy Scale: Poor Sitting balance - Comments: Pt unable to  tolerate upright sitting without MAXA external support Postural control: Posterior lean Standing balance support: Bilateral upper extremity supported, During functional activity Standing balance-Leahy Scale: Poor                             ADL either performed or assessed with clinical judgement   ADL Overall ADL's : Needs assistance/impaired     Grooming: Wash/dry face;Wash/dry hands;Standing;Total assistance       Lower Body Bathing: Sit to/from stand;Maximal assistance;+2 for safety/equipment   Upper Body Dressing : Total assistance;Sitting           Toileting- Clothing Manipulation and Hygiene: Maximal assistance;Sit to/from stand;+2 for safety/equipment       Functional mobility during ADLs: Maximal assistance;+2 for safety/equipment General ADL Comments: MAXA - TOTALA +2 on this date for all ADLs, pt refused to assist or open her eyes     Vision   Additional Comments: Pt kept her eyes closed throughout eval                       Pertinent Vitals/Pain Pain Assessment Pain Assessment: PAINAD Faces Pain Scale: No hurt Breathing: normal Negative Vocalization: none Facial Expression: smiling or inexpressive Body Language: relaxed Consolability: no need to console PAINAD Score: 0     Extremity/Trunk Assessment Upper Extremity Assessment Upper Extremity Assessment: Generalized weakness;Difficult to assess due to impaired cognition   Lower Extremity Assessment Lower Extremity Assessment: Defer to PT evaluation;Difficult to assess due to impaired cognition   Cervical / Trunk Assessment Cervical / Trunk Assessment: Kyphotic   Communication Communication Communication: Impaired Factors Affecting Communication: Hearing impaired   Cognition Arousal: Lethargic Behavior During Therapy: Restless, Flat affect Cognition: History of cognitive impairments             OT - Cognition Comments: dx of advanced dementia                  Following commands: Impaired Following commands impaired: Follows one step commands inconsistently     Cueing  General Comments   Cueing Techniques: Verbal cues;Visual cues;Tactile cues;Gestural cues  Melfa underneath pillow on arrival to room, pt spo2 levels 87% on room air, donned 2L o2 pt on 94% via    Exercises Exercises: Other exercises Other Exercises Other Exercises: Edu: Role of OT eval, safe ADL completion, the effects of lying in urine for a prolonged period of time        Home Living Family/patient expects to be discharged to:: Private residence Living Arrangements: Spouse/significant other Available Help at Discharge: Family;Available 24 hours/day Type of Home: House       Home Layout: One level     Bathroom Shower/Tub: Sponge bathes at baseline         Home Equipment: Cane - single Librarian, Academic (2 wheels)   Additional Comments: PLOF obtained from previous hospitalization. Per previous eval, the clinician who got this info was able to speak with this patient's daughter. Patient has no family present to provide info. Patient has been combative this admission      Prior Functioning/Environment Prior Level of Function : Patient poor historian/Family not available             Mobility Comments: Per chart review, pt is IND with amb. Family holds patient's arm for steadying. ADLs Comments: Per previous evaluation  and chart review: pt recieves supervision from spouse for safety. Pt is able to perform ADLs    OT Problem List: Decreased strength;Decreased activity tolerance;Decreased cognition;Decreased safety awareness   OT Treatment/Interventions: Self-care/ADL training;Therapeutic exercise;Energy conservation;DME and/or AE instruction;Therapeutic activities;Patient/family education;Balance training      OT Goals(Current goals can be found in the care plan section)   Acute Rehab OT Goals Patient Stated Goal: Unable to participate OT Goal  Formulation: Patient unable to participate in goal setting Time For Goal Achievement: 01/12/24 Potential to Achieve Goals: Fair   OT Frequency:  Min 2X/week    Co-evaluation              AM-PAC OT 6 Clicks Daily Activity     Outcome Measure Help from another person eating meals?: A Lot Help from another person taking care of personal grooming?: A Lot Help from another person toileting, which includes using toliet, bedpan, or urinal?: A Lot Help from another person bathing (including washing, rinsing, drying)?: Total Help from another person to put on and taking off regular upper body clothing?: A Lot Help from another person to put on and taking off regular lower body clothing?: Total 6 Click Score: 10   End of Session Equipment Utilized During Treatment: Oxygen Nurse Communication: Mobility status;Other (comment) (Need for assistance for linen change)  Activity Tolerance: Patient limited by lethargy Patient left: in bed;with nursing/sitter in room  OT Visit Diagnosis: Unsteadiness on feet (R26.81);Other abnormalities of gait and mobility (R26.89);Muscle weakness (generalized) (M62.81);History of falling (Z91.81);Other symptoms and signs involving cognitive function                Time: 9146-9094 OT Time Calculation (min): 12 min Charges:  OT General Charges $OT Visit: 1 Visit OT Evaluation $OT Eval Moderate Complexity: 1 Mod  Temisha Murley M.S. OTR/L  12/29/23, 10:34 AM

## 2023-12-29 NOTE — ED Notes (Signed)
 Attempted again to have pt drink water. Pt refusing still. Tried using straw as well, pt will move head around to avoid straw. Will continue to try again at a later time.

## 2023-12-29 NOTE — ED Notes (Signed)
 Pt attempting to get out of bed. Pt informed she needs to remain in bed. Pt has voided, sheet changed new chuck pad placed.

## 2023-12-29 NOTE — ED Notes (Signed)
VOl pending medical admit

## 2023-12-29 NOTE — ED Notes (Signed)
 Pt woken by this RN, sheet placed back on bed as pt had it bundled up. PT at bedside.

## 2023-12-29 NOTE — ED Notes (Addendum)
 Noise heard in pts room. This tech walked in and pt was standing up in the corner of the room tearful looking for a way out. This tech and Environmental Education Officer assisted pt back into bed. Pts brief soiled of urine, peri care and new brief was provided.

## 2023-12-29 NOTE — ED Notes (Signed)
 Pt assisted to BR with 2 person assist

## 2023-12-29 NOTE — Progress Notes (Signed)
 SLP Cancellation Note  Patient Details Name: DARTHY MANGANELLI MRN: [id number] DOB: 02/23/1950   Cancelled treatment:       Reason Eval/Treat Not Completed: Patient's level of consciousness;Patient not medically ready (chart reviewed; consulted NSG re: pt's status currenlty)    Post chart review, consulted NSG who reported that pt is too lethargic to take po's. NSG stated he has held everything so far today d/t safety concern. He reported too much sedating medication last night.  ST services will f/u tomorrow w/ BSE. Updated MD/NSG that if there is concern for aspiration, NPO status is recommended until a BSE can be performed. Pt has a Baseline of significant dementia per a previous chart note (oral phase deficits and impulsive eating are often seen); this can impact safety w/ oral intake. This information was relayed to MD via secure chat also. NSG agreed w/ above.      Comer Portugal, MS, CCC-SLP Speech Language Pathologist Rehab Services; [facility] [phone] (ascom) Miyanna Wiersma 12/29/2023, 11:37 AM

## 2023-12-29 NOTE — ED Notes (Signed)
 Pt crying out saying my head hurts my head hurts.

## 2023-12-29 NOTE — ED Notes (Signed)
 Pt cleaned of urine, new sheet, brief, and chuck pad placed. Pt repositioned in bed and covered with blanket.

## 2023-12-29 NOTE — ED Notes (Signed)
 Pt heard crying out, this RN went to check on pt, pt laying in bed with gown off and attempting to remove heart monitor, BP cuff, and IV. Pt not opening eyes, and not responding to any questions from this RN. Pt will only cry and yell for Johnny, which pt was informed she is in the ER and [name] is not here at this time. Fluids stopped and pt covered with gown and blanket which she continues to take off. O2 sensor remains on pt.

## 2023-12-29 NOTE — ED Notes (Signed)
 Pt agitated, yelling from room

## 2023-12-29 NOTE — ED Notes (Signed)
 Vitals assessed. Pt hypoxic at 80% on RA. Placed back on oxygen. Took 6 liters to improve to 97%.

## 2023-12-29 NOTE — ED Notes (Signed)
 Pt agitated and yelling out

## 2023-12-29 NOTE — TOC Initial Note (Signed)
 Transition of Care [facility]) - Initial/Assessment Note    Patient Details  Name: Anne [name] MRN: [id number] Date of Birth: [birth year]  [facility]) CM/SW Contact:    Shaney Deckman L Kaien Pezzullo, LCSW Phone Number: 12/29/2023, 8:03 AM  Clinical Narrative:                  Patient aspirated at home and was choking. TOC consult for LTC placement. Patient has a DSS SW; Leonor Ply. The supervisor is Jessika Blue. Contact information listed in contacts. They are working on placement. An FL2 was provided to them prior to discharge from the ED just a few days ago.          Patient Goals and CMS Choice            Expected Discharge Plan and Services                                              Prior Living Arrangements/Services                       Activities of Daily Living      Permission Sought/Granted                  Emotional Assessment              Admission diagnosis:  Aspiration pneumonia (HCC) [J69.0] Patient Active Problem List   Diagnosis Date Noted   Aspiration pneumonia (HCC) 12/29/2023   Rash 11/21/2023   Bilateral impacted cerumen 09/27/2023   Seizure disorder (HCC) 06/14/2023   Bursitis 06/14/2023   Hypoxia 06/02/2023   Memory impairment 05/31/2023   Aggressive behavior 05/06/2023   Dementia with agitation (HCC) 04/05/2023   Suicidal ideation 03/24/2023   Altered mental status 03/15/2023   Abnormal urinalysis 03/15/2023   Weakness 02/26/2023   Acute urinary retention 02/05/2023   Seizure (HCC) 01/22/2023   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/22/2023   Tremor 11/23/2022   Urinary urgency 03/26/2022   Sore throat 03/07/2022   Seizure-like activity (HCC) 02/08/2022   Seborrheic keratosis 09/20/2021   Closed fracture of nasal bones 07/26/2021   Falls frequently 07/26/2021   Head injury 07/07/2021   Left hip pain 07/07/2021   Lipoma 07/07/2021   Slurred speech 06/07/2021   Subdural  hematoma (HCC) 01/23/2021   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   Aortic atherosclerosis 08/24/2020   Dementia with behavioral disturbance (HCC) 03/14/2020   Left shoulder pain 07/22/2019   Hypothyroidism 02/16/2019   Goals of care, counseling/discussion 08/06/2018   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), in remission, 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   Skin tear of forearm without complication, initial encounter 12/06/2017   Bruising 12/06/2017   Overweight 08/05/2017   Anemia 01/23/2017   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   Gastritis 01/23/2017   Hypokalemia 01/03/2017   Heart murmur 07/27/2016   Low back pain 08/15/2015   Hypertension 02/22/2015   HLD (hyperlipidemia) 02/22/2015   Depression 02/22/2015   PCP:  Gretel App, NP Pharmacy:   [facility] [address], [name] - [address] [name] [address] Phone: [phone] Fax: [phone]  [facility] [address] [name] [address] Phone: [phone] Fax: [phone]  [facility] [phone]  [address] [name] [address] Phone: [phone] Fax: [phone]  [facility] - [facility] [address] [name] [address] Phone: [phone] Fax: [phone]     Social Drivers of Health (SDOH) Social History: SDOH Screenings   Food Insecurity: No Food Insecurity (06/23/2023)   Received from [facility]  Housing: Low Risk  (04/24/2023)  Transportation Needs: No Transportation Needs (06/23/2023)   Received from [facility]  Utilities: Not At Risk (04/24/2023)  Alcohol Screen: Low Risk  (02/22/2023)  Depression (PHQ2-9): Low Risk  (11/21/2023)  Financial Resource Strain: High Risk (06/23/2023)   Received from [facility]  Physical Activity: Inactive (02/22/2023)  Social Connections: Moderately Isolated  (02/20/2023)  Stress: Patient Unable To Answer (02/22/2023)  Tobacco Use: Medium Risk (12/19/2023)  Health Literacy: Patient Unable To Answer (02/22/2023)   SDOH Interventions:     Readmission Risk Interventions     No data to display

## 2023-12-29 NOTE — ED Notes (Signed)
 This RN offered pt some water to drink to assess swallowing, pt refusing water at this time. Informed pt would like her to drink something before eating so we don't aspirate, pt covered head with blanket. Will try again at a later time.

## 2023-12-29 NOTE — ED Notes (Signed)
 Pt stating bed is wet. This RN and EDT Caitlyn cleaned pt of urine, placed new sheet, chuck pads, gown and gave warm blankets. Pt very somnolent during this process but able to stand with two person assist however very unsteady. Pt not opening eyes once placed back in bed. Attempting to asses pt ability to swallow however pt will not answer any questions or participate in swallow test.

## 2023-12-29 NOTE — ED Notes (Signed)
 Staff heard a loud noise. All staff to room. Pt had gotten up out of bed and tripped over her long oxygen tubing/ Pt had fallen and caught herself with her hands and was found laying on her left side hands in front of her on the safety matts. Charge made aware. SZP placed. Pt is wearing yellow socks and fall bracelet already.

## 2023-12-29 NOTE — ED Notes (Signed)
 Spouse here to visit. He was updated as much as he could be.

## 2023-12-29 NOTE — ED Notes (Signed)
 Attempted to place pt back on cardiac monitor, pt will take off cords. Pt ok with BP and O2 sensor at this time. Pt O2 at 99% on RA, will hold off on Traill at this time.

## 2023-12-30 ENCOUNTER — Encounter: Payer: Self-pay | Admitting: Nurse Practitioner

## 2023-12-30 ENCOUNTER — Other Ambulatory Visit: Payer: Self-pay

## 2023-12-30 DIAGNOSIS — J690 Pneumonitis due to inhalation of food and vomit: Secondary | ICD-10-CM | POA: Diagnosis not present

## 2023-12-30 LAB — CBC
HCT: 39.4 % (ref 36.0–46.0)
Hemoglobin: 12.5 g/dL (ref 12.0–15.0)
MCH: 29.2 pg (ref 26.0–34.0)
MCHC: 31.7 g/dL (ref 30.0–36.0)
MCV: 92.1 fL (ref 80.0–100.0)
Platelets: 111 K/uL — ABNORMAL LOW (ref 150–400)
RBC: 4.28 MIL/uL (ref 3.87–5.11)
RDW: 15.8 % — ABNORMAL HIGH (ref 11.5–15.5)
WBC: 7.8 K/uL (ref 4.0–10.5)
nRBC: 0 % (ref 0.0–0.2)

## 2023-12-30 LAB — COMPREHENSIVE METABOLIC PANEL WITH GFR
ALT: 8 U/L (ref 0–44)
AST: 18 U/L (ref 15–41)
Albumin: 3.7 g/dL (ref 3.5–5.0)
Alkaline Phosphatase: 74 U/L (ref 38–126)
Anion gap: 8 (ref 5–15)
BUN: 13 mg/dL (ref 8–23)
CO2: 26 mmol/L (ref 22–32)
Calcium: 9 mg/dL (ref 8.9–10.3)
Chloride: 107 mmol/L (ref 98–111)
Creatinine, Ser: 0.58 mg/dL (ref 0.44–1.00)
GFR, Estimated: >60 mL/min (ref >60)
Glucose, Bld: 87 mg/dL (ref 70–99)
Potassium: 3.6 mmol/L (ref 3.5–5.1)
Sodium: 140 mmol/L (ref 135–145)
Total Bilirubin: 0.5 mg/dL (ref 0.0–1.2)
Total Protein: 6.5 g/dL (ref 6.5–8.1)

## 2023-12-30 LAB — AMMONIA: Ammonia: 45 umol/L — ABNORMAL HIGH (ref 9–35)

## 2023-12-30 NOTE — Progress Notes (Signed)
 OT Cancellation Note  Patient Details Name: Anne [name] MRN: [id number] DOB: Jun 19, 1950   Cancelled Treatment:    Reason Eval/Treat Not Completed: Medical issues which prohibited therapy. Pt with hypotension and BP 83/57, pt declining therapy this afternoon. OT will re-attempt as able.   Julieta Rogalski L. Keari Miu, OTR/L  12/30/23, 4:24 PM

## 2023-12-30 NOTE — TOC Progression Note (Signed)
 Transition of Care [facility]) - Progression Note    Patient Details  Name: Anne [name] MRN: [id number] Date of Birth: Apr 03, 1950  [facility]) CM/SW Contact  Dalia GORMAN Fuse, RN Phone Number: 12/30/2023, 3:27 PM  Clinical Narrative:    TOC spoke with Leonor Ply with DSS [phone], the would like for the patient to go to SNF when she is medically ready. They have spoken with Medicaid and she has Medicaid LTC, it will just need to change it to the admitting facility. TOC advised the patient has bed offers from [facility] and [facility] Leonor will discuss the options with the patients daughter who is positioning for guardianship and let TOC know the decision.                      Expected Discharge Plan and Services                                               Social Drivers of Health (SDOH) Interventions SDOH Screenings   Food Insecurity: No Food Insecurity (06/23/2023)   Received from [facility]  Housing: Low Risk  (04/24/2023)  Transportation Needs: No Transportation Needs (06/23/2023)   Received from [facility]  Utilities: Not At Risk (04/24/2023)  Alcohol Screen: Low Risk  (02/22/2023)  Depression (PHQ2-9): Low Risk  (11/21/2023)  Financial Resource Strain: High Risk (06/23/2023)   Received from [facility]  Physical Activity: Inactive (02/22/2023)  Social Connections: Moderately Isolated (02/20/2023)  Stress: Patient Unable To Answer (02/22/2023)  Tobacco Use: Medium Risk (12/19/2023)  Health Literacy: Patient Unable To Answer (02/22/2023)    Readmission Risk Interventions     No data to display

## 2023-12-30 NOTE — Telephone Encounter (Signed)
 Noted

## 2023-12-30 NOTE — Progress Notes (Addendum)
 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  On presentation, she was hypoxic and required oxygen supplementation.  CT chest did not show any acute findings.  She became combative in the emergency department and was given Ativan .  Currently respiratory status stable.  PT/OT consulted  Assessment & Plan:  Principal Problem:   Aspiration pneumonia (HCC)  Acute on chronic hypoxic respiratory failure secondary to aspiration event : No infiltrates on the chest x-ray or CT imaging.  No fever or leukocytosis or infiltrate on chest x-ray so antibiotics discontinued.  Continue pulmonary toileting, incentive spirometer, flutter valve.  Continue to attempt to wean the oxygen.  Speech therapy recommending dysphagia 2 diet  Advanced Alzheimer dementia: Complicated by hallucination, agitation.  Became combative in the emergency department.  On Depakote , Namenda , Exelon , Seroquel  at home.  Follows with Dr. Gregg from neurology.  On Haldol  as needed.  Continue delirium precaution, frequent reorientation.  Hypothyroidism: Continue Synthyroid  History of cirrhosis of liver: Currently stable  History of lung cancer with bone mets: Follows with oncology, Dr. Melanee.  Currently under surveillance.Some small nodule seen on CT on arrival, reportedly unchanged from 2023.   Chronic thrombocytopenia: Currently platelets level stable.  Complex social situation: TOC following.  PT/OT consulted.  Might need placement in a long-term facility.  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         DVT prophylaxis:enoxaparin  (LOVENOX ) injection 40 mg Start: 12/29/23  0800     Code Status: Full Code  Family Communication: None at bedside  Patient status:Inpatient  Patient is from :home  Anticipated discharge un:[facility] facility  Estimated DC date:not sure   Consultants: None  Procedures:None  Antimicrobials:  Anti-infectives (From admission, onward)    Start     Dose/Rate Route Frequency Ordered Stop   12/29/23 0245  cefTRIAXone  (ROCEPHIN ) 2 g in sodium chloride  0.9 % 100 mL IVPB  Status:  Discontinued        2 g 200 mL/hr over 30 Minutes Intravenous Every 24 hours 12/29/23 0239 12/29/23 0753   12/29/23 0245  azithromycin  (ZITHROMAX ) 500 mg in sodium chloride  0.9 % 250 mL IVPB  Status:  Discontinued        500 mg 250 mL/hr over 60 Minutes Intravenous Every 24 hours 12/29/23 0239 12/29/23 0753       Subjective: Patient seen and examined at bedside today.  Lying on bed.  Oxygen prongs were off but she did not appear dyspneic.  Confused.  Obeys commands.  Not in any Distress.  Lungs were clear on auscultation  Objective: Vitals:   12/29/23 1926 12/30/23 0542 12/30/23 1152 12/30/23 1212  BP: 130/66 126/65 94/63 90/60   Pulse: 70 74 84 100  Resp: 16 16 18    Temp:  98.7 F (37.1 C) 97.6 F (36.4 C)   TempSrc:  Axillary Oral   SpO2: 96% 100% 97% 94%  Weight:      Height:       No intake or output data in the 24 hours ending 12/30/23 1255 Filed Weights   12/28/23 1952  Weight: 70 kg    Examination:  General exam: Overall comfortable, not in distress HEENT: PERRL Respiratory system:  no wheezes or crackles  Cardiovascular system: S1 & S2 heard, RRR.  Gastrointestinal system: Abdomen is nondistended, soft and nontender. Central nervous system: Alert and awake but not oriented Extremities: No edema, no clubbing ,no cyanosis Skin: No rashes, no ulcers,no icterus     Data Reviewed: I have personally reviewed following labs and imaging studies  CBC: Recent Labs  Lab 12/28/23 2003 12/29/23 0513 12/30/23 0621  WBC 5.3  7.4 7.8  NEUTROABS 3.4  --   --   HGB 13.1 12.2 12.5  HCT 42.2 38.7 39.4  MCV 93.4 92.8 92.1  PLT 126* 123* 111*   Basic Metabolic Panel: Recent Labs  Lab 12/28/23 2003 12/29/23 0513 12/30/23 0621  NA 143 144 140  K 3.2* 3.8 3.6  CL 103 108 107  CO2 29 27 26   GLUCOSE 95 95 87  BUN 16 12 13   CREATININE 0.84 0.69 0.58  CALCIUM  9.0 8.6* 9.0     No results found for this or any previous visit (from the past 240 hours).   Radiology Studies: CT Chest Wo Contrast Result Date: 12/28/2023 EXAM: CT CHEST WITHOUT CONTRAST 12/28/2023 11:30:00 PM TECHNIQUE: CT of the chest was performed without the administration of intravenous contrast. Multiplanar reformatted images are provided for review. Automated exposure control, iterative reconstruction, and/or weight based adjustment of the mA/kV was utilized to reduce the radiation dose to as low as reasonably achievable. COMPARISON: 01/01/2022, 06/02/2023, plain film from earlier in the same day. CLINICAL HISTORY: Recent choking episode. FINDINGS: MEDIASTINUM: Heart: Coronary calcifications are seen. No cardiac enlargement is noted. The central airways are clear. The thoracic aorta shows atherosclerotic calcifications without aneurysmal dilatation. The thoracic inlet is within normal limits. The esophagus, as visualized, is within normal limits. LYMPH NODES: No mediastinal, hilar or axillary lymphadenopathy. LUNGS AND PLEURA: Lungs are well aerated bilaterally. Minimal basilar atelectasis is seen. A few small nodules are noted the largest of which lies on image number 47 of series 4. This is stable from the prior exam as well as multiple previous exams dating back to at least 2023 consistent with benign etiology. No further follow-up is recommended. No new sizable parenchymal nodules are seen. No pleural effusion or pneumothorax. SOFT TISSUES/BONES: Degenerative changes of the thoracic spine are seen. No acute rib abnormality is noted. Right chest wall  port is again noted. UPPER ABDOMEN: Limited images of the upper abdomen demonstrates no acute abnormality. IMPRESSION: 1. No acute cardiopulmonary findings to explain the recent choking episode. Electronically signed by: Oneil Devonshire MD 12/28/2023 11:45 PM EST RP Workstation: [id number]   DG Chest 2 View Result Date: 12/28/2023 EXAM: 2 VIEW(S) XRAY OF THE CHEST 12/28/2023 08:15:00 PM COMPARISON: 06/02/2023 CLINICAL HISTORY: aspiration, eval infiltrate FINDINGS: LINES, TUBES AND DEVICES: Right port-a-cath tip is in the SVC, unchanged. LUNGS AND PLEURA: No focal pulmonary opacity. No pleural effusion. No pneumothorax. HEART AND MEDIASTINUM: Aortic atherosclerosis. No acute abnormality of the cardiac silhouette. BONES AND SOFT TISSUES: No acute osseous abnormality. IMPRESSION: 1. No acute cardiopulmonary process. Electronically signed by: Franky Crease MD 12/28/2023 08:38 PM EST RP Workstation: [id number]    Scheduled Meds:  divalproex   250 mg Oral BID   enoxaparin  (LOVENOX ) injection  40 mg Subcutaneous Q24H   levothyroxine   112 mcg Oral QAC breakfast   memantine   10 mg Oral BID   QUEtiapine   100 mg Oral q AM   rivastigmine   3 mg Oral BID   rosuvastatin   20 mg Oral Daily   sertraline   100 mg Oral QHS   traZODone   50 mg Oral QHS   zonisamide   100 mg Oral BID   Continuous Infusions:   LOS: 1 day   Ivonne Mustache, MD [facility] P11/24/2025, 12:55 PM

## 2023-12-30 NOTE — ED Notes (Signed)
 RN attempted to call report. NO answer at this time. Pt taken upstairs by tech with all belongings.

## 2023-12-30 NOTE — Evaluation (Signed)
 Clinical/Bedside Swallow Evaluation Patient Details  Name: Anne [name] MRN: [id number] Date of Birth: [birth year]  Today's Date: 12/30/2023 Time: SLP Start Time (ACUTE ONLY): 1220 SLP Stop Time (ACUTE ONLY): 1250 SLP Time Calculation (min) (ACUTE ONLY): 30 min  Past Medical History:  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), in remission, 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   Metastatic bone cancer    Seizures (HCC)    Subdural hematoma (HCC)    Past Surgical History:  Past Surgical History:  Procedure Laterality Date   APPENDECTOMY  1971   CHOLECYSTECTOMY  1971   COLONOSCOPY WITH PROPOFOL  N/A 11/15/2016   Procedure: COLONOSCOPY WITH PROPOFOL ;  Surgeon: Therisa Bi, MD;  Location: [facility] ENDOSCOPY;  Service: Gastroenterology;  Laterality: N/A;   ENDOBRONCHIAL ULTRASOUND Right 07/30/2018   Procedure: ENDOBRONCHIAL ULTRASOUND;  Surgeon: Verdia Art, MD;  Location: [facility] ORS;  Service: Pulmonary;  Laterality: Right;   ESOPHAGOGASTRODUODENOSCOPY (EGD) WITH PROPOFOL  N/A 01/07/2017   Procedure: ESOPHAGOGASTRODUODENOSCOPY (EGD) WITH PROPOFOL ;  Surgeon: Therisa Bi, MD;  Location: [facility] ENDOSCOPY;  Service: Gastroenterology;  Laterality: N/A;   LAPAROSCOPY N/A 03/01/2017   Procedure: LAPAROSCOPY DIAGNOSTIC;  Surgeon: Dessa Reyes ORN, MD;  Location: [facility] ORS;  Service: General;  Laterality: N/A;   PORTA CATH INSERTION N/A 08/14/2018   Procedure: PORTA CATH INSERTION;  Surgeon: Marea Selinda RAMAN, MD;  Location: [facility] INVASIVE CV LAB;  Service: Cardiovascular;  Laterality: N/A;   TONSILECTOMY, ADENOIDECTOMY, BILATERAL MYRINGOTOMY AND TUBES  1955   TONSILLECTOMY     VENTRAL HERNIA REPAIR N/A 03/01/2017   10 x 14 CM Ventralight ST mesh, intraperitoneal location.    VENTRAL HERNIA REPAIR N/A 03/01/2017    Procedure: HERNIA REPAIR VENTRAL ADULT;  Surgeon: Dessa Reyes ORN, MD;  Location: [facility] ORS;  Service: General;  Laterality: N/A;   HPI:  Per Physician H&P, Anne [name] is a 73 y.o. 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  She presents to the ED on 11/22 after reported episode of aspiration.  Patient was initially found to be choking on food and desaturating to the 70s.  After excessive coughing her O2 sats returned to the 90s.  On arrival to the ED she continued to desat into the 80s and required 2 to 3 L nasal cannula.  No infiltrates on CXR.  All labs.  CT chest without acute cardiopulmonary findings.    Assessment / Plan / Recommendation  Clinical Impression  Pt seen for bedside swallow assessment in the setting of reported aspiration during PO intake, leading to admission. Pt with hx of SLP services in Dec of 2024 following complications with subdural hematoma, with recommendation for Dys 2 (chopped) solids. Noted bouts of agitation during admission, though at the time of evaluation, pt alert (though drowsy), calm, and finishing meal. Pt on 3L nasal canula. Pt seen with trials of thin liquids (via straw) and regular solids. No overt or subtle s/sx pharyngeal dysphagia noted. No change to vocal quality across trials. Vitals stable for duration of trials (O2 saturations 94-97). Oral phase impacted by mentation, leading to impulsivity and reduced awareness for amount of PO in oral cavity. Mastication prolonged- hindered by  limited dentition. Verbal cues and restricting access to solids aided slowing rate of intake. Use of liquid wash also facilitated clearing oral cavity. Verbal cues also aided sustained alertness for completion of PO.  Given pt's mentation (hx of dementia, impulsivity), hx of SDH, GERD, and  lethargy (specifically related to medication), pt is at increased risk for aspiration. Recommend supervision with intake to optimize safety. Aspiration precautions (slow rate, small bites, elevated HOB, and alert for PO intake). Diet modified to chopped (dys 2) to reduce oral manipulation burden and aid efficiency in oral clearance with continued thin liquids. RN and MD aware of recommendations. Board in room updated. SLP will continue to follow for dysphagia intervention.    SLP Visit Diagnosis: Dysphagia, unspecified (R13.10) (suspect related to mentation)    Aspiration Risk  Moderate aspiration risk    Diet Recommendation   Dysphagia 2 (chopped);Thin  Medication Administration: Whole meds with liquid    Other  Recommendations Oral Care Recommendations: Oral care prior to ice chip/H20;Staff/trained caregiver to provide oral care     Assistance Recommended at Discharge    Functional Status Assessment Patient has had a recent decline in their functional status and demonstrates the ability to make significant improvements in function in a reasonable and predictable amount of time. (unsure of baseline)  Frequency and Duration min 2x/week  2 weeks       Prognosis Prognosis for improved oropharyngeal function: Fair Barriers to Reach Goals: Cognitive deficits;Severity of deficits      Swallow Study   General Date of Onset: 12/30/23 HPI: Per Physician H&P, Anne [name] is a 73 y.o. 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  She presents to the ED on 11/22 after reported episode of aspiration.  Patient was initially found to be choking on food and desaturating to the 70s.  After excessive coughing her O2 sats returned to the 90s.  On arrival to the ED she continued to desat into the 80s and required 2 to 3 L  nasal cannula.  No infiltrates on CXR.  All labs.  CT chest without acute cardiopulmonary findings. Type of Study: Bedside Swallow Evaluation Previous Swallow Assessment: last seen by SLP services in Dec of 2024 with final recommendations of Dys 2 and thin liquids Diet Prior to this Study: Regular;Thin liquids (Level 0) Temperature Spikes Noted: No Respiratory Status: Nasal cannula (3L) History of Recent Intubation: No Behavior/Cognition: Cooperative;Impulsive;Lethargic/Drowsy;Distractible;Requires cueing Oral Cavity Assessment: Within Functional Limits Oral Care Completed by SLP: Recent completion by staff Oral Cavity - Dentition: Dentures, top (top plate, no bottom plate) Vision: Functional for self-feeding Self-Feeding Abilities: Able to feed self (needs assist and set up) Patient Positioning: Upright in bed Baseline Vocal Quality: Low vocal intensity Volitional Cough: Cognitively unable to elicit Volitional Swallow: Unable to elicit    Oral/Motor/Sensory Function Overall Oral Motor/Sensory Function: Within functional limits   Ice Chips Ice chips: Not tested   Thin Liquid Thin Liquid: Within functional limits Presentation: Straw    Nectar Thick Nectar Thick Liquid: Not tested   Honey Thick Honey Thick Liquid: Not tested   Puree Puree: Not tested   Solid     Solid: Impaired Presentation: Self Fed Oral Phase Impairments: Impaired mastication;Poor awareness of bolus (large bites) Oral Phase Functional Implications: Impaired mastication;Prolonged oral transit Pharyngeal Phase Impairments:  (none)     Shilah Hefel Clapp, MS, CCC-SLP Speech Language Pathologist Rehab Services;  [name] [phone] (ascom)   Neriah Brott J Clapp 12/30/2023,1:20 PM

## 2023-12-30 NOTE — Progress Notes (Signed)
 PT Cancellation Note  Patient Details Name: TALAYSHA FREEBERG MRN: [id number] DOB: [birth year]   Cancelled Treatment:    Reason Eval/Treat Not Completed: Patient declined, no reason specified Pt sleep in room awaking shortly to decline therapy. Pt husband I'm room you should just come back later, there's not much therapy to do right now   [name] 12/30/2023, 4:21 PM

## 2023-12-30 NOTE — ED Notes (Signed)
 VOL/Medical Admit

## 2023-12-31 ENCOUNTER — Ambulatory Visit: Admitting: Nurse Practitioner

## 2023-12-31 DIAGNOSIS — J690 Pneumonitis due to inhalation of food and vomit: Secondary | ICD-10-CM | POA: Diagnosis not present

## 2023-12-31 MED ORDER — OLANZAPINE 10 MG IM SOLR
5.0000 mg | Freq: Once | INTRAMUSCULAR | Status: AC
  Administered 2023-12-31: 5 mg via INTRAMUSCULAR
  Filled 2023-12-31: qty 10

## 2023-12-31 MED ORDER — OLANZAPINE 10 MG IM SOLR
5.0000 mg | Freq: Once | INTRAMUSCULAR | Status: DC
  Filled 2023-12-31: qty 10

## 2023-12-31 NOTE — Progress Notes (Signed)
 Occupational Therapy Treatment Patient Details Name: Anne [name] MRN: [id number] DOB: 03-22-1950 Today's Date: 12/31/2023   History of present illness Anne [name] is a 73 y.o. 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  She presents to the ED on 11/22 after reported episode of aspiration.  Patient was initially found to be choking on food and desaturating to the 70s.  After excessive coughing her O2 sats returned to the 90s.  On arrival to the ED she continued to desat into the 80s and required 2 to 3 L nasal cannula.   OT comments  Patient seen for OT treatment on this date. Upon arrival to room patient resting in bed, easily awoken and agreeable to treatment. While sitting EOB, patient engaged in UB HEP with good return demo but required constant cueing (visual demonstration provided). Patient able to doff/don socks while sitting EOB with CGA. Patient performed sit<>stand with HHA and able to side step up/down length of bed x 2, take 2 steps forward/backward and weight shift side to side with SBA. O2 monitored throughout, 96% on 2L.  Patient ended treatment in bed with bed/chair alarm on and all needs within reach. Patient making good progress toward goals, will continue to follow POC. Discharge recommendation remains appropriate.        If plan is discharge home, recommend the following:  A lot of help with walking and/or transfers;A lot of help with bathing/dressing/bathroom;Assist for transportation;Direct supervision/assist for medications management;Direct supervision/assist for financial management;Supervision due to cognitive status;Assistance with cooking/housework;Assistance with feeding   Equipment Recommendations  Other (comment)    Recommendations for Other Services      Precautions / Restrictions  Precautions Precautions: Fall Recall of Precautions/Restrictions: Impaired Restrictions Weight Bearing Restrictions Per Provider Order: No       Mobility Bed Mobility Overal bed mobility: Needs Assistance Bed Mobility: Supine to Sit, Sit to Supine     Supine to sit: Contact guard, Used rails, HOB elevated Sit to supine: Contact guard assist   General bed mobility comments: cues for sequencing    Transfers Overall transfer level: Needs assistance Equipment used: 1 person hand held assist Transfers: Sit to/from Stand Sit to Stand: Min assist           General transfer comment: STS from EOB with hand held assist with min A     Balance Overall balance assessment: Needs assistance Sitting-balance support: Feet supported, Bilateral upper extremity supported Sitting balance-Leahy Scale: Good     Standing balance support: Bilateral upper extremity supported, During functional activity Standing balance-Leahy Scale: Fair                             ADL either performed or assessed with clinical judgement   ADL Overall ADL's : Needs assistance/impaired     Grooming: Wash/dry face;Sitting;Set up;Cueing for sequencing               Lower Body Dressing: Contact guard assist;Sitting/lateral leans Lower Body Dressing Details (indicate cue type and reason): doffed/donned socks while sitting EOB                    Extremity/Trunk Assessment Upper Extremity Assessment Upper Extremity Assessment: Generalized weakness   Lower Extremity Assessment Lower Extremity Assessment: Defer to PT evaluation        Vision  Perception     Praxis     Communication Communication Communication: Impaired Factors Affecting Communication: Hearing impaired   Cognition Arousal: Lethargic Behavior During Therapy: WFL for tasks assessed/performed Cognition: History of cognitive impairments             OT - Cognition Comments: dx of advanced  dementia                 Following commands: Impaired Following commands impaired: Follows one step commands inconsistently      Cueing   Cueing Techniques: Verbal cues, Visual cues, Tactile cues, Gestural cues  Exercises Exercises: Other exercises Other Exercises Other Exercises: sitting EOB UB mobility and core stability: patient able to perform neck flexion/extension, head rotation, scapular retraction/protraction, reaching in all planes including crossing midline, hand held A to faciliate full rotation with good tolerance    Shoulder Instructions       General Comments      Pertinent Vitals/ Pain       Pain Assessment Pain Assessment: No/denies pain  Home Living                                          Prior Functioning/Environment              Frequency  Min 2X/week        Progress Toward Goals  OT Goals(current goals can now be found in the care plan section)  Progress towards OT goals: Progressing toward goals  Acute Rehab OT Goals Patient Stated Goal: none stated OT Goal Formulation: Patient unable to participate in goal setting Time For Goal Achievement: 01/12/24 Potential to Achieve Goals: Fair  Plan      Co-evaluation                 AM-PAC OT 6 Clicks Daily Activity     Outcome Measure   Help from another person eating meals?: A Lot Help from another person taking care of personal grooming?: A Lot Help from another person toileting, which includes using toliet, bedpan, or urinal?: A Lot Help from another person bathing (including washing, rinsing, drying)?: Total Help from another person to put on and taking off regular upper body clothing?: A Lot Help from another person to put on and taking off regular lower body clothing?: Total 6 Click Score: 10    End of Session Equipment Utilized During Treatment: Oxygen  OT Visit Diagnosis: Unsteadiness on feet (R26.81);Other abnormalities of gait and mobility  (R26.89);Muscle weakness (generalized) (M62.81);History of falling (Z91.81);Other symptoms and signs involving cognitive function   Activity Tolerance Patient limited by lethargy   Patient Left in bed;with bed alarm set;with call bell/phone within reach   Nurse Communication          Time: 8951-8894 OT Time Calculation (min): 17 min  Charges: OT General Charges $OT Visit: 1 Visit OT Treatments $Therapeutic Activity: 8-22 mins  Rogers Clause, OT/L MSOT, 12/31/2023

## 2023-12-31 NOTE — Evaluation (Signed)
 Physical Therapy Evaluation Patient Details Name: Anne [name] MRN: [id number] DOB: 05/05/1950 Today's Date: 12/31/2023  History of Present Illness  Patient is a 73 year old female with acute on chronic hypoxic respiratory failure secondary to aspiration event. 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  Clinical Impression  Patient is agreeable to PT evaluation. She is confused but cooperative. She is asking to go home.  The patient required steadying assistance with ambulation today. She walked with and without rolling walker with improved gait pattern and balance using rolling walker compared to no device. She needed assistance also for her balance with dynamic standing activity while performed ADL in the bathroom. Consider rehabilitation < 3 hours/day after this hospital stay.        If plan is discharge home, recommend the following: A little help with walking and/or transfers;A little help with bathing/dressing/bathroom;Assistance with cooking/housework;Assist for transportation;Help with stairs or ramp for entrance;Supervision due to cognitive status   Can travel by private vehicle   No    Equipment Recommendations None recommended by PT  Recommendations for Other Services       Functional Status Assessment Patient has had a recent decline in their functional status and demonstrates the ability to make significant improvements in function in a reasonable and predictable amount of time.     Precautions / Restrictions Precautions Precautions: Fall Recall of Precautions/Restrictions: Impaired Restrictions Weight Bearing Restrictions Per Provider Order: No      Mobility  Bed Mobility Overal bed mobility: Needs Assistance Bed Mobility: Supine to Sit, Sit to Supine     Supine to sit: Contact guard, Used rails, HOB elevated Sit to supine:  Contact guard assist        Transfers Overall transfer level: Needs assistance Equipment used: 1 person hand held assist Transfers: Sit to/from Stand Sit to Stand: Min assist, Contact guard assist           General transfer comment: posterior bias with standing. occasional Min A required for safety. several standing bouts performed    Ambulation/Gait Ambulation/Gait assistance: Min assist, Contact guard assist Gait Distance (Feet): 75 Feet Assistive device: Rolling walker (2 wheels), 1 person hand held assist Gait Pattern/deviations: Step-through pattern, Decreased stride length Gait velocity: decreased     General Gait Details: patient walked around 15 ft without the device, unsteady at times. recommend to use rolling walker with improved gait pattern and standing balance. she is impulsive with activity and internally distracted, requiring safety cues while walking in hallway  Stairs            Wheelchair Mobility     Tilt Bed    Modified Rankin (Stroke Patients Only)       Balance Overall balance assessment: Needs assistance Sitting-balance support: Feet supported, Bilateral upper extremity supported Sitting balance-Leahy Scale: Good     Standing balance support: During functional activity, No upper extremity supported Standing balance-Leahy Scale: Poor Standing balance comment: loss of balance with toileting tasks, requiring Min A to correct posterior loss of balance                             Pertinent Vitals/Pain Pain Assessment Pain Assessment: No/denies pain    Home Living Family/patient expects to be discharged to:: Private residence Living Arrangements: Spouse/significant other Available Help at Discharge: Family;Available 24 hours/day Type of Home: House  Home Layout: One level Home Equipment: Cane - single Librarian, Academic (2 wheels) Additional Comments: PLOF from chart    Prior Function Prior Level of  Function : Patient poor historian/Family not available             Mobility Comments: intermittent assistance from family. patient reports she has a rolling walker  but does not like using it ADLs Comments: supervision required per prior notes     Extremity/Trunk Assessment   Upper Extremity Assessment Upper Extremity Assessment: Generalized weakness    Lower Extremity Assessment Lower Extremity Assessment: Generalized weakness       Communication   Communication Communication: Impaired Factors Affecting Communication: Hearing impaired    Cognition Arousal: Alert Behavior During Therapy: WFL for tasks assessed/performed   PT - Cognitive impairments: History of cognitive impairments, Memory, Safety/Judgement, Orientation   Orientation impairments: Situation                     Following commands: Impaired Following commands impaired: Follows one step commands inconsistently     Cueing Cueing Techniques: Verbal cues, Visual cues, Tactile cues, Gestural cues     General Comments      Exercises     Assessment/Plan    PT Assessment Patient needs continued PT services  PT Problem List Decreased strength;Decreased range of motion;Decreased activity tolerance;Decreased balance;Decreased mobility;Decreased cognition;Decreased knowledge of use of DME;Decreased safety awareness       PT Treatment Interventions DME instruction;Gait training;Stair training;Functional mobility training;Therapeutic activities;Balance training;Therapeutic exercise;Neuromuscular re-education;Cognitive remediation;Patient/family education;Wheelchair mobility training    PT Goals (Current goals can be found in the Care Plan section)  Acute Rehab PT Goals Patient Stated Goal: to go home PT Goal Formulation: With patient Time For Goal Achievement: 01/14/24 Potential to Achieve Goals: Fair    Frequency Min 1X/week     Co-evaluation               AM-PAC PT 6 Clicks  Mobility  Outcome Measure Help needed turning from your back to your side while in a flat bed without using bedrails?: None Help needed moving from lying on your back to sitting on the side of a flat bed without using bedrails?: A Little Help needed moving to and from a bed to a chair (including a wheelchair)?: A Little Help needed standing up from a chair using your arms (e.g., wheelchair or bedside chair)?: A Little Help needed to walk in hospital room?: A Little Help needed climbing 3-5 steps with a railing? : A Lot 6 Click Score: 18    End of Session   Activity Tolerance: Patient tolerated treatment well Patient left: in bed;with call bell/phone within reach;with bed alarm set Nurse Communication: Mobility status PT Visit Diagnosis: Muscle weakness (generalized) (M62.81);Unsteadiness on feet (R26.81)    Time: 1000-1015 PT Time Calculation (min) (ACUTE ONLY): 15 min   Charges:     PT Treatments $Gait Training: 8-22 mins PT General Charges $$ ACUTE PT VISIT: 1 Visit         Anne [name], PT, MPT   Anne [name] 12/31/2023, 12:02 PM

## 2023-12-31 NOTE — Plan of Care (Signed)

## 2023-12-31 NOTE — Progress Notes (Signed)
 Mobility Specialist - Progress Note  Vitals O2 @ 2L  Pre- 96%  During Mob- 93%  Post-93%   12/31/23 1600  Mobility  Activity Ambulated with assistance;Stood at bedside;Respositioned in chair;Dangled on edge of bed  Level of Assistance Contact guard assist, steadying assist  Assistive Device Front wheel walker  Distance Ambulated (ft) 15 ft  Range of Motion/Exercises Active Assistive;All extremities  Activity Response Tolerated fair  Mobility visit 1 Mobility  Mobility Specialist Start Time (ACUTE ONLY) 1545  Mobility Specialist Stop Time (ACUTE ONLY) 1617  Mobility Specialist Time Calculation (min) (ACUTE ONLY) 32 min   Pt was supine in bed on O2 @ 2L with guest in the room upon entry. Pt agreed to mobility. Pt O2 vitals were taken throughout activity as a precaution. Guest remained in the room throughout activity. Pt is able today to get to the EOB with minA CGA and VC. Pt is able today to STS with minA CGA and 2 WW. Pt is ambulated well with constant VC. Pt vitals remained WNL throughout activity. After activity pt repositioned in the recliner with needs in reach. After consideration pt was transferred to the bed with bed alarm on and needs within reach.  Clem Rodes Mobility Specialist 12/31/23, 4:44 PM

## 2023-12-31 NOTE — Progress Notes (Signed)
 Speech Language Pathology Treatment: Dysphagia  Patient Details Name: Anne [name] MRN: [id number] DOB: Feb 07, 1950 Today's Date: 12/31/2023 Time: 1320-1400 SLP Time Calculation (min) (ACUTE ONLY): 40 min  Assessment / Plan / Recommendation Clinical Impression  Pt seen for ongoing assessment of swallowing and toleration of current Dysphagia level 2(Minced foods) diet. Family member in room watching TV. Pt appeared to be struggling w/ self-feeding d/t poor UE coordination at scooping the food onto the utensil, then decreased proprioceptive awareness bringing utensil to mouth- she missed her mouth often.  This SLP then assisted pt w/ the remainder of her meal. She was awake/engaged in the meal task; confusion+ and min drowsy-appearing in the face. Pt required MOD cues and assistance w/ feeding and follow through (remembering to drink too) during the meal.  On [place] O2 support, afebrile. WBC WNL.  Pt appears to present w/ grossly functional oropharyngeal phase swallowing in general; min oral phase deficits/dysphagia in setting of declined Cognitive status; Baseline Dementia. ANY Cognitive decline can impact overall awareness/perception/timing/amount of bolus material in the mouth and swallowing which then can impact safety during po tasks- this can increase risk for choking, aspiration. Pt's risk for dysphagia can be reduced when following general aspiration precautions and using a modified diet consistency of broken down foods d/t Top Denture plate only; Edentulous on the Bottom.  She required MOD+ verbal/visual/tactile cues for follow through during po tasks and self-feeding.        Pt consumed several trials of purees, minced solids and thin liquids via straw w/ No overt, clinical s/s of aspiration noted: no decline in vocal quality, no cough, and no decline in respiratory status during/post trials. O2 sats 98% when checked. Oral phase was functional for bolus management and oral clearing of the  boluses w/ Time given and monitoring for Small bolus size of bites. Slow, deliberate OM movements noted during bolus acceptance and mastication of minced foods- moistening the foods aided oral phase management(cohesion of boluses). Pt attempted self-feeding but required MOD support and guidance d/t the Cognitive decline. She was able to feed self which can improve safety of swallowing.   In setting of baseline Dementia/Cognitive decline, sedating medications, and current Acute illness/hospitalization, recommend continuing the dysphagia level 2(MINCED foods) diet moistened for ease of oral phase/mastication; thin liquids- Monitor Sip Size for Small sips Slowly. General aspiration precautions; reduce Distractions during meals and engage pt in self-feeding. Tray setup and Sitting Up for all oral intake. Pills Crushed in Puree for safer swallowing as needed. Support w/ feeding at meals as needed.    ST services recommends follow w/ Palliative Care for GOC and education re: impact of Cognitive decline/Dementia on swallowing, oral intake. Suspect pt is close to/at her baseline. Precautions posted in room, chart. Further diet upgrade is not recommended at this time during Acuity of illness- this diet consistency is Most beneficial to support nutrition/oral intake in setting of her Cognitive decline and No Bottom Dentition for effective mastication. Recommend Dietician and Palliative Care f/u (GOC) as support for pt and Family. MD/NSG updated.        HPI HPI: Per Physician H&P, Anne [name] is a 73 y.o. female with Alzheimer's Dementia complicated by hallucinations, hyperlipidemia, hypertension, history of lung cancer metastatic to bone in remission, seizures, history of subdural hematoma, depression, arthritis, cirrhosis.  She presents to the ED on 11/22 after reported episode of aspiration.  Patient was initially found to be choking on food and desaturating to the 70s.  After excessive coughing  her O2 sats  returned to the 90s.  On arrival to the ED she continued to desat into the 80s and required 2 to 3 L nasal cannula.  No infiltrates on CXR.  All labs.  CT chest without acute cardiopulmonary findings..  Pt is currently had multiple medications which have a sedating effect d/t confusion/agitation at admit.      SLP Plan  All goals met at this setting; Consult other service (comment) (Palliative Care for GOC moving forward; Dietician f/u for support)          Recommendations  Diet recommendations: Dysphagia 2 (fine chop);Thin liquid (added purees) Liquids provided via: Cup;Straw (monitor for Impulsive drinking) Medication Administration: Crushed with puree Supervision: Patient able to self feed;Staff to assist with self feeding;Intermittent supervision to cue for compensatory strategies Compensations: Minimize environmental distractions;Slow rate;Small sips/bites;Lingual sweep for clearance of pocketing;Follow solids with liquid Postural Changes and/or Swallow Maneuvers: Out of bed for meals;Seated upright 90 degrees;Upright 30-60 min after meal                 (Palliative Care for GOC moving forward; Dietician f/u for support) Oral care BID;Oral care before and after PO;Staff/trained caregiver to provide oral care (Denture care)   Intermittent Supervision/Assistance (> Frequent Supervision if needed) Dysphagia, unspecified (R13.10) (in setting of Dementia and acute illness/hospitalization; need for support w/ eating at meals)     All goals met;Consult other service (comment) (Palliative Care for GOC moving forward; Dietician f/u for support)       Comer Portugal, MS, CCC-SLP Speech Language Pathologist Rehab Services; [facility] [phone] (ascom) Coralie Stanke  12/31/2023, 2:54 PM

## 2023-12-31 NOTE — Hospital Course (Addendum)
 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  On presentation, she was hypoxic and required oxygen supplementation.  CT chest did not show any acute findings.  She became combative in the emergency department and was given Ativan .  Currently respiratory status stable.  PT/OT consulted.  11/27: Hemodynamically stable on 2 L of oxygen.  Pending insurance authorization for SNF.  Might need long-term placement.  DSS is involved.  11/28: Remained hemodynamically stable, wants to go home with husband.  Per husband he also wants her to come home.  TOC to discuss with her DSS worker.  11/29: Remained hemodynamically stable with intermittent agitation which is her baseline due to advanced dementia.  Case was discussed again with TOC, DSS is not having any guardianship and patient is being discharged home with husband.  Discussed with husband at day prior and he would like her to come back home.  Patient remained stable on 2 L of oxygen which is her baseline.  There was no concern of pneumonia, pneumonia ruled out and patient did not require antibiotics.  Patient with multiple significant life limiting comorbidities and will remain high risk for readmission and mortality.  She will continue on current medications and need to have a close follow-up with her providers for further assistance.

## 2023-12-31 NOTE — TOC Progression Note (Signed)
 Transition of Care [facility]) - Progression Note    Patient Details  Name: MERY GUADALUPE MRN: [id number] Date of Birth: July 09, 1950  [facility]) CM/SW Contact  Dalia GORMAN Fuse, RN Phone Number: 12/31/2023, 1:42 PM  Clinical Narrative:     TOC received message from [place] with DSS, they choose [facility]. TOC selected [employer] in the hub and made Tammy aware. TOC placed call to HTA, to start [employer].                    Expected Discharge Plan and Services                                               Social Drivers of Health (SDOH) Interventions SDOH Screenings   Food Insecurity: No Food Insecurity (12/30/2023)  Housing: Low Risk  (12/30/2023)  Transportation Needs: No Transportation Needs (12/30/2023)  Utilities: Not At Risk (12/30/2023)  Alcohol Screen: Low Risk  (02/22/2023)  Depression (PHQ2-9): Low Risk  (11/21/2023)  Financial Resource Strain: High Risk (06/23/2023)   Received from [facility]  Physical Activity: Inactive (02/22/2023)  Social Connections: Patient Unable To Answer (12/30/2023)  Stress: Patient Unable To Answer (02/22/2023)  Tobacco Use: Medium Risk (12/19/2023)  Health Literacy: Patient Unable To Answer (02/22/2023)    Readmission Risk Interventions     No data to display

## 2023-12-31 NOTE — Progress Notes (Signed)
  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  On presentation, she was hypoxic and required oxygen supplementation.  CT chest did not show any acute findings.  She became combative in the emergency department and was given Ativan .  Currently respiratory status stable.  PT/OT consulted    Assessment and Plan:  Acute on chronic hypoxic respiratory failure secondary to aspiration event :  No infiltrates on the chest x-ray or CT imaging.  No fever or leukocytosis or infiltrate on chest x-ray so antibiotics discontinued.  Continue pulmonary toileting, incentive spirometer, flutter valve.  Continue to attempt to wean the oxygen.   Speech therapy recommending dysphagia 2 diet   Advanced Alzheimer dementia:  Complicated by hallucination, agitation.  Became combative in the emergency department.  On Depakote , Namenda , Exelon , Seroquel  at home.   Follows with Dr. Gregg from neurology.  On Haldol  as needed.   Continue delirium precaution, frequent reorientation.   Hypothyroidism: Continue Synthyroid   History of cirrhosis of liver: Currently stable   History of lung cancer with bone mets: Follows with oncology, Dr. Melanee.   Currently under surveillance.Some small nodule seen on CT on arrival, reportedly unchanged from 2023.    Chronic thrombocytopenia: Currently platelets level stable.   Complex social situation: TOC following.  PT/OT consulted.  Might need placement in a long-term facility.  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      Subjective: Hard of hearing denies new  symptoms  Physical Exam: Vitals:   12/30/23 2212 12/31/23 0212 12/31/23 0221 12/31/23 0836  BP: (!) 163/78  (!) 103/57 119/73  Pulse: 74 71  70  Resp: 14  15 14   Temp: 97.9 F (36.6 C) 98.2 F (36.8 C)  97.8 F (36.6 C)  TempSrc: Axillary Axillary    SpO2: 93%  91% 94%  Weight:      Height:       Alert awake confused Neck is supple Lungs are clear to auscultation Heart regular S1-S2 Abdomen soft bowel sounds are present Extremities no edema Skin no rashes  Data Reviewed:  Reviewed  Family Communication: Family was at bedside  Disposition: Status is: Inpatient Remains inpatient appropriate because: Ongoing recovery from aspiration pneumonia and also been planned for placement.  Case manager is working on finding the placement.  Planned Discharge Destination: Skilled nursing facility    Time spent: 35 minutes  Author: Lyrik Buresh, MD 12/31/2023 1:43 PM  For on call review [email or web address].

## 2024-01-01 ENCOUNTER — Other Ambulatory Visit: Payer: Self-pay

## 2024-01-01 DIAGNOSIS — E440 Moderate protein-calorie malnutrition: Secondary | ICD-10-CM | POA: Insufficient documentation

## 2024-01-01 DIAGNOSIS — J690 Pneumonitis due to inhalation of food and vomit: Secondary | ICD-10-CM | POA: Diagnosis not present

## 2024-01-01 MED ORDER — SODIUM CHLORIDE 0.9% FLUSH
10.0000 mL | Freq: Two times a day (BID) | INTRAVENOUS | Status: DC
  Administered 2024-01-01 – 2024-01-04 (×6): 10 mL

## 2024-01-01 MED ORDER — CHLORHEXIDINE GLUCONATE CLOTH 2 % EX PADS
6.0000 | MEDICATED_PAD | Freq: Every day | CUTANEOUS | Status: DC
Start: 2024-01-01 — End: 2024-01-04
  Administered 2024-01-02 – 2024-01-04 (×4): 6 via TOPICAL

## 2024-01-01 MED ORDER — ADULT MULTIVITAMIN W/MINERALS CH
1.0000 | ORAL_TABLET | Freq: Every day | ORAL | Status: DC
  Administered 2024-01-01 – 2024-01-04 (×4): 1 via ORAL
  Filled 2024-01-01 (×4): qty 1

## 2024-01-01 MED ORDER — ENSURE PLUS HIGH PROTEIN PO LIQD
237.0000 mL | Freq: Two times a day (BID) | ORAL | Status: DC
  Administered 2024-01-01 – 2024-01-04 (×6): 237 mL via ORAL

## 2024-01-01 MED ORDER — SODIUM CHLORIDE 0.9% FLUSH: 10.0000 mL | INTRAVENOUS | Status: DC | PRN

## 2024-01-01 NOTE — TOC Progression Note (Addendum)
 Transition of Care [facility]) - Progression Note    Patient Details  Name: Anne [name] MRN: [id number] Date of Birth: [birth year]  Transition of Care [facility]) CM/SW Contact  Dalia GORMAN Fuse, RN Phone Number: 01/01/2024, 9:16 AM  Clinical Narrative:     Continuing to attempt to wean O2, on haldol  as needed, and on a dysphagia 2 diet. HTA ins shara is pending for [employer]. TOC will continue to follow.    TOC spoke with Tammy at [facility] and advised that per DSS, patient has Medicaid LTC, it will need to be switched to the facility. Tammy shared the patient will need to be admitted for STR and to continue with the HTA auth.    TOC received a call from the [facility], the patient doesn't meet medical necessity for SNF. They would like to offer a P2P. Please call Dr. Janit [phone] before 12 noon tomorrow.     TOC spoke with Tammy at the facility and they are not supposed to take anyone that is just long term due to bed availability. She would like for the family to appeal the decision          Expected Discharge Plan and Services                                               Social Drivers of Health (SDOH) Interventions SDOH Screenings   Food Insecurity: No Food Insecurity (12/30/2023)  Housing: Low Risk  (12/30/2023)  Transportation Needs: No Transportation Needs (12/30/2023)  Utilities: Not At Risk (12/30/2023)  Alcohol Screen: Low Risk  (02/22/2023)  Depression (PHQ2-9): Low Risk  (11/21/2023)  Financial Resource Strain: High Risk (06/23/2023)   Received from [facility]  Physical Activity: Inactive (02/22/2023)  Social Connections: Patient Unable To Answer (12/30/2023)  Stress: Patient Unable To Answer (02/22/2023)  Tobacco Use: Medium Risk (12/19/2023)  Health Literacy: Patient Unable To Answer (02/22/2023)    Readmission Risk Interventions     No data to display

## 2024-01-01 NOTE — Progress Notes (Addendum)
  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  On presentation, she was hypoxic and required oxygen supplementation.  CT chest did not show any acute findings.  She became combative in the emergency department and was given Ativan .  Currently respiratory status stable.  PT/OT consulted    Assessment and Plan:  Acute on chronic hypoxic respiratory failure secondary to aspiration event :  No infiltrates on the chest x-ray or CT imaging.   No fever or leukocytosis or infiltrate on chest x-ray so antibiotics discontinued.   Continue pulmonary toileting, incentive spirometer, flutter valve.   Continue to attempt to wean the oxygen.   Speech therapy recommending dysphagia 2 diet 11/26: Patient on sound sleep, received Geodon  and Ativan  during the night   Advanced Alzheimer dementia:  Complicated by hallucination, agitation.   Became combative in the emergency department.   On Depakote , Namenda , Exelon , Seroquel  at home.   Follows with Dr. Gregg from neurology.  On Haldol  as needed.   Continue delirium precaution, frequent reorientation. Patient was given likely Geodon  and Ativan  last night, sleeping today   Hypothyroidism: Continue Synthyroid   History of cirrhosis of liver: Currently stable   History of lung cancer with bone mets: Follows with oncology, Dr. Melanee.   Currently under surveillance.Some small nodule seen on CT on arrival, reportedly unchanged from 2023.    Chronic thrombocytopenia: Currently platelets level stable.   Complex social situation:  TOC following.    PT/OT/ST consulted.   Patient need placement in a long-term facility.   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      Subjective: Sleeping today  Physical Exam: Vitals:   12/31/23 1949 12/31/23 2357 01/01/24 0601 01/01/24 0900  BP: 131/79 (!) 148/77 121/67 (!) 152/72  Pulse: 73 67 66 67  Resp: 16 16 16 18   Temp: 97.8 F (36.6 C) 97.9 F (36.6 C) (!) 97.5 F (36.4 C) 97.8 F (36.6 C)  TempSrc: Oral   Axillary  SpO2: (!) 89% 99% 97% 98%  Weight:      Height:       Sleepy Lungs are clear to auscultation Heart regular S1-S2 Abdomen soft bowel sounds are present Extremities no edema  Data Reviewed:  Reviewed  Family Communication: None today  Disposition: Status is: Inpatient Remains inpatient appropriate because: Ongoing recovery from acute on hypoxemic respiratory failure and delirium/dementia  Planned Discharge Destination: Home versus long-term care unit.  Insurance denied skilled rehab placement.  11/26.    Time spent: 35 minutes  Author: Miroslav Gin, MD 01/01/2024 1:22 PM  For on call review [email or web address].

## 2024-01-01 NOTE — Progress Notes (Signed)
 Initial Nutrition Assessment  DOCUMENTATION CODES:   Non-severe (moderate) malnutrition in context of chronic illness  INTERVENTION:   -Continue dysphagia 2 diet with thin liquids -MVI with mineral daily;y -Ensure Plus High Protein po BID, each supplement provides 350 kcal and 20 grams of protein  -Magic cup TID with meals, each supplement provides 290 kcal and 9 grams of protein  -Feeding assistance with meals   NUTRITION DIAGNOSIS:   Moderate Malnutrition related to chronic illness (dementia) as evidenced by mild fat depletion, moderate fat depletion, mild muscle depletion, moderate muscle depletion.  GOAL:   Patient will meet greater than or equal to 90% of their needs  MONITOR:   PO intake, Supplement acceptance, Diet advancement  REASON FOR ASSESSMENT:   Consult Assessment of nutrition requirement/status  ASSESSMENT:   73 y.o. 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  She presented on 11/22 after reported episode of aspiration.  Patient admitted with aspiration event.   11/24- s/p- BSE- dysphagia 2 diet with thin liquids 11/25- s/p BSE- dysphagia 2 diet with thin liquids  Reviewed I/O's: +360 ml x 24 hours  Patient sleeping soundly at time of visit. She did not arouse to voice or touch. No family at bedside to provide additional history. Per discussion with sitter at bedside, patient has been knocked out all shift. Per her report, patient did not sleep well last night and she feels like her body is tired. She does not think she has been given medications and has been sleeping entire shift.   Noted breakfast tray on tray table, which was unattempted.   Patient with DSS guardian; plan for SNF placement at discharge. Insurance authorization pending.   Patient is currently on a  dysphagia 2 diet. Per meal completions records, meal completions 90-100%. Case discussed with SLP, who plans to continue to current diet.   Reviewed weight history. No weight loss noted over the past 6 months. During this time period, weight has ranged from 66.7 kg-73.9 kg.   RD will add supplements and feeding assistance to help optimize oral intake. RD would be hesitant to recommend artifical means of nutrition/ hydration secondary to advanced dementia; this would not enhance patient's quality of life.   Medications reviewed.   Labs reviewed.    NUTRITION - FOCUSED PHYSICAL EXAM:  Flowsheet Row Most Recent Value  Orbital Region Moderate depletion  Upper Arm Region Mild depletion  Thoracic and Lumbar Region Mild depletion  Buccal Region Moderate depletion  Temple Region Moderate depletion  Clavicle Bone Region Moderate depletion  Clavicle and Acromion Bone Region Moderate depletion  Scapular Bone Region Moderate depletion  Dorsal Hand Moderate depletion  Patellar Region Mild depletion  Anterior Thigh Region Mild depletion  Posterior Calf Region Mild depletion  Edema (RD Assessment) None  Hair Reviewed  Eyes Reviewed  Mouth Reviewed  Skin Reviewed  Nails Reviewed    Diet Order:   Diet Order             DIET DYS 2 Room service appropriate? No; Fluid consistency: Thin  Diet effective now                   EDUCATION NEEDS:   Not appropriate for education at this time  Skin:  Skin Assessment: Reviewed RN Assessment  Last BM:  12/31/23  Height:   Ht Readings from Last 1 Encounters:  12/28/23 5' 7 (1.702  m)    Weight:   Wt Readings from Last 1 Encounters:  12/28/23 70 kg    Ideal Body Weight:  61.4 kg  BMI:  Body mass index is 24.17 kg/m.  Estimated Nutritional Needs:   Kcal:  1800-2000  Protein:  90-105 grams  Fluid:  1.8-2.0 L    Margery ORN, RD, LDN, CDCES Registered Dietitian III Certified Diabetes Care and Education Specialist If  unable to reach this RD, please use RD Inpatient group chat on secure chat between hours of 8am-4 pm daily

## 2024-01-01 NOTE — Plan of Care (Signed)
  Problem: Education: Goal: Knowledge of General Education information will improve Description: Including pain rating scale, medication(s)/side effects and non-pharmacologic comfort measures Outcome: Progressing   Problem: Clinical Measurements: Goal: Ability to maintain clinical measurements within normal limits will improve Outcome: Progressing Goal: Cardiovascular complication will be avoided Outcome: Progressing   Problem: Nutrition: Goal: Adequate nutrition will be maintained Outcome: Progressing   Problem: Elimination: Goal: Will not experience complications related to urinary retention Outcome: Progressing

## 2024-01-02 DIAGNOSIS — E440 Moderate protein-calorie malnutrition: Secondary | ICD-10-CM

## 2024-01-02 DIAGNOSIS — J690 Pneumonitis due to inhalation of food and vomit: Secondary | ICD-10-CM | POA: Diagnosis not present

## 2024-01-02 MED ORDER — POLYETHYLENE GLYCOL 3350 17 G PO PACK
17.0000 g | PACK | Freq: Every day | ORAL | Status: DC
  Administered 2024-01-02 – 2024-01-04 (×3): 17 g via ORAL
  Filled 2024-01-02 (×3): qty 1

## 2024-01-02 NOTE — Plan of Care (Signed)

## 2024-01-02 NOTE — Progress Notes (Signed)
  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  On presentation, she was hypoxic and required oxygen supplementation.  CT chest did not show any acute findings.  She became combative in the emergency department and was given Ativan .  Currently respiratory status stable.  PT/OT consulted.  11/27: Hemodynamically stable on 2 L of oxygen.  Pending insurance authorization for SNF.  Might need long-term placement.  DSS is involved.  Assessment and Plan:  Acute on chronic hypoxic respiratory failure secondary to aspiration event :  No infiltrates on the chest x-ray or CT imaging.  No fever or leukocytosis or infiltrate on chest x-ray so antibiotics discontinued.  Continue pulmonary toileting, incentive spirometer, flutter valve.  Continue to attempt to wean the oxygen.   Speech therapy recommending dysphagia 2 diet   Advanced Alzheimer dementia:  Complicated by hallucination, agitation.  Became combative in the emergency department.  On Depakote , Namenda , Exelon , Seroquel  at home.   Follows with Dr. Gregg from neurology.  On Haldol  as needed.   Continue delirium precaution, frequent reorientation.   Hypothyroidism: Continue Synthyroid   History of cirrhosis of liver: Currently stable   History of lung cancer with bone mets: Follows with oncology, Dr. Melanee.   Currently under surveillance.Some small nodule seen on CT on arrival, reportedly unchanged from 2023.    Chronic thrombocytopenia: Currently platelets level stable.   Complex social situation: TOC following.  PT/OT consulted.   Might need placement in a long-term facility.  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      Subjective: Patient was resting comfortably when seen today.  Easily arousable.  No nursing concern, ate her breakfast earlier.  Physical Exam: Vitals:   01/02/24 0011 01/02/24 0411 01/02/24 0758 01/02/24 1615  BP: (!) 163/63 (!) 136/56 99/69 103/63  Pulse: 72 69 67 74  Resp:   16 16  Temp: (!) 96.1 F (35.6 C) 97.9 F (36.6 C) (!) 97.5 F (36.4 C) 98.2 F (36.8 C)  TempSrc: Axillary Axillary Axillary Axillary  SpO2: 94% 93% 93% 91%  Weight:      Height:       General.  Frail elderly lady, in no acute distress. Pulmonary.  Lungs clear bilaterally, normal respiratory effort. CV.  Regular rate and rhythm, no JVD, rub or murmur. Abdomen.  Soft, nontender, nondistended, BS positive. CNS.  Alert and oriented to self only.  No focal neurologic deficit. Extremities.  No edema, pulses intact and symmetrical.  Data Reviewed:  Reviewed  Family Communication:   Disposition: Status is: Inpatient Remains inpatient appropriate because: Ongoing recovery from aspiration pneumonia and also been planned for placement.  Case manager is working on finding the placement.  Planned Discharge Destination: Skilled nursing facility  DVT prophylaxis.  Lovenox  Time spent: 40 minutes  This record has been created using Conservation officer, historic buildings. Errors have been sought and corrected,but may not always be located. Such creation errors do not reflect on the standard of care.   Author: Amaryllis Dare, MD 01/02/2024 4:20 PM  For on call review [email or web address].

## 2024-01-03 ENCOUNTER — Encounter: Payer: Self-pay | Admitting: Oncology

## 2024-01-03 ENCOUNTER — Other Ambulatory Visit: Payer: Self-pay

## 2024-01-03 DIAGNOSIS — E440 Moderate protein-calorie malnutrition: Secondary | ICD-10-CM | POA: Diagnosis not present

## 2024-01-03 DIAGNOSIS — J690 Pneumonitis due to inhalation of food and vomit: Secondary | ICD-10-CM | POA: Diagnosis not present

## 2024-01-03 NOTE — Plan of Care (Signed)
 Patient's son has been bedside most of the day keeping patient comfortable. Patient states that she is ready to go home. Son suggest consulting chaplain to talk to her in the next couple of days.   Problem: Education: Goal: Knowledge of General Education information will improve Description: Including pain rating scale, medication(s)/side effects and non-pharmacologic comfort measures Outcome: Progressing   Problem: Health Behavior/Discharge Planning: Goal: Ability to manage health-related needs will improve Outcome: Not Progressing   Problem: Clinical Measurements: Goal: Ability to maintain clinical measurements within normal limits will improve Outcome: Progressing Goal: Will remain free from infection Outcome: Progressing Goal: Diagnostic test results will improve Outcome: Progressing Goal: Respiratory complications will improve Outcome: Progressing Goal: Cardiovascular complication will be avoided Outcome: Progressing   Problem: Activity: Goal: Risk for activity intolerance will decrease Outcome: Progressing   Problem: Nutrition: Goal: Adequate nutrition will be maintained Outcome: Progressing   Problem: Coping: Goal: Level of anxiety will decrease Outcome: Not Progressing   Problem: Elimination: Goal: Will not experience complications related to bowel motility Outcome: Progressing Goal: Will not experience complications related to urinary retention Outcome: Progressing   Problem: Pain Managment: Goal: General experience of comfort will improve and/or be controlled Outcome: Progressing   Problem: Safety: Goal: Ability to remain free from injury will improve Outcome: Progressing   Problem: Skin Integrity: Goal: Risk for impaired skin integrity will decrease Outcome: Progressing   Problem: Activity: Goal: Ability to tolerate increased activity will improve Outcome: Progressing   Problem: Clinical Measurements: Goal: Ability to maintain a body temperature in  the normal range will improve Outcome: Progressing   Problem: Respiratory: Goal: Ability to maintain adequate ventilation will improve Outcome: Progressing Goal: Ability to maintain a clear airway will improve Outcome: Progressing

## 2024-01-03 NOTE — Progress Notes (Signed)
 Physical Therapy Treatment Patient Details Name: Anne [name] MRN: [id number] DOB: Jan 25, 1951 Today's Date: 01/03/2024   History of Present Illness Patient is a 73 year old female with acute on chronic hypoxic respiratory failure secondary to aspiration event. 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    PT Comments  Patient received up in recliner, sitter and husband present. Sitter reports patient wants to move around. She stands from recliner with cga. Ambulated 175 feet with RW and cga. Cues for direction/safety needed. Patient occasionally gets outside of walker and requires assistance for safety due to poor walker negotiation ( obstacle avoidance).  Patient will continue to benefit from skilled PT to improve safety and independence.     If plan is discharge home, recommend the following: A little help with walking and/or transfers;A little help with bathing/dressing/bathroom;Supervision due to cognitive status   Can travel by private vehicle     Yes  Equipment Recommendations  None recommended by PT    Recommendations for Other Services       Precautions / Restrictions Precautions Precautions: Fall Recall of Precautions/Restrictions: Impaired Restrictions Weight Bearing Restrictions Per Provider Order: No     Mobility  Bed Mobility               General bed mobility comments: NT patient in recliner    Transfers Overall transfer level: Needs assistance Equipment used: Rolling walker (2 wheels) Transfers: Sit to/from Stand Sit to Stand: Contact guard assist                Ambulation/Gait Ambulation/Gait assistance: Contact guard assist Gait Distance (Feet): 175 Feet Assistive device: Rolling walker (2 wheels) Gait Pattern/deviations: Step-through pattern, Decreased step length - right, Decreased step length - left,  Decreased stride length, Trunk flexed Gait velocity: decreased     General Gait Details: Ambulated around the nursing unit with RW. cues(gestures) for direction as she is very Youth Worker Bed    Modified Rankin (Stroke Patients Only)       Balance Overall balance assessment: Needs assistance Sitting-balance support: Feet supported Sitting balance-Leahy Scale: Good     Standing balance support: Bilateral upper extremity supported, During functional activity, Reliant on assistive device for balance Standing balance-Leahy Scale: Fair Standing balance comment: no LOB when holding to RW                            Communication Communication Communication: Impaired Factors Affecting Communication: Hearing impaired  Cognition Arousal: Alert Behavior During Therapy: WFL for tasks assessed/performed   PT - Cognitive impairments: History of cognitive impairments                       PT - Cognition Comments: Oriented to self only; does follow simple commands with increased time, encouragement Following commands: Impaired Following commands impaired: Follows one step commands inconsistently, Follows one step commands with increased time    Cueing Cueing Techniques: Verbal cues, Gestural cues  Exercises      General Comments        Pertinent Vitals/Pain Pain Assessment Pain Assessment: PAINAD Breathing: normal Negative Vocalization: none Facial Expression: smiling or inexpressive Body Language: relaxed Consolability: no need to console PAINAD Score: 0 Pain Intervention(s): Monitored during session  Home Living                          Prior Function            PT Goals (current goals can now be found in the care plan section) Acute Rehab PT Goals Patient Stated Goal: to go home PT Goal Formulation: With patient Time For Goal Achievement: 01/14/24 Potential to Achieve Goals:  Good Progress towards PT goals: Progressing toward goals    Frequency    Min 1X/week      PT Plan      Co-evaluation              AM-PAC PT 6 Clicks Mobility   Outcome Measure  Help needed turning from your back to your side while in a flat bed without using bedrails?: None Help needed moving from lying on your back to sitting on the side of a flat bed without using bedrails?: A Little Help needed moving to and from a bed to a chair (including a wheelchair)?: A Little Help needed standing up from a chair using your arms (e.g., wheelchair or bedside chair)?: A Little Help needed to walk in hospital room?: A Little Help needed climbing 3-5 steps with a railing? : A Little 6 Click Score: 19    End of Session Equipment Utilized During Treatment: Gait belt Activity Tolerance: Patient tolerated treatment well Patient left: in chair;with nursing/sitter in room;with family/visitor present Nurse Communication: Mobility status PT Visit Diagnosis: Unsteadiness on feet (R26.81);Other abnormalities of gait and mobility (R26.89)     Time: 8680-8668 PT Time Calculation (min) (ACUTE ONLY): 12 min  Charges:    $Gait Training: 8-22 mins PT General Charges $$ ACUTE PT VISIT: 1 Visit                     Hersel Mcmeen, PT, GCS 01/03/24,1:40 PM

## 2024-01-03 NOTE — Progress Notes (Signed)
 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  On presentation, she was hypoxic and required oxygen supplementation.  CT chest did not show any acute findings.  She became combative in the emergency department and was given Ativan .  Currently respiratory status stable.  PT/OT consulted.  11/27: Hemodynamically stable on 2 L of oxygen.  Pending insurance authorization for SNF.  Might need long-term placement.  DSS is involved.  11/28: Remained hemodynamically stable, wants to go home with husband.  Per husband he also wants her to come home.  TOC to discuss with her DSS worker.  Assessment and Plan:  Acute on chronic hypoxic respiratory failure secondary to aspiration event :  No infiltrates on the chest x-ray or CT imaging.  No fever or leukocytosis or infiltrate on chest x-ray so antibiotics discontinued.  Continue pulmonary toileting, incentive spirometer, flutter valve.  Continue to attempt to wean the oxygen.   Speech therapy recommending dysphagia 2 diet   Advanced Alzheimer dementia:  Complicated by hallucination, agitation.  Became combative in the emergency department.  On Depakote , Namenda , Exelon , Seroquel  at home.   Follows with Dr. Gregg from neurology.  On Haldol  as needed.   Continue delirium precaution, frequent reorientation.   Hypothyroidism: Continue Synthyroid   History of cirrhosis of liver: Currently stable   History of lung cancer with bone mets: Follows with oncology, Dr. Melanee.   Currently under surveillance.Some small nodule seen on CT on arrival,  reportedly unchanged from 2023.    Chronic thrombocytopenia: Currently platelets level stable.   Complex social situation: TOC following.  PT/OT consulted.  Might need placement in a long-term facility.  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      Subjective: Patient was seen and examined today.  Husband at bedside and they both wants to go home.  No new concern.  Physical Exam: Vitals:   01/02/24 2022 01/03/24 0438 01/03/24 0916 01/03/24 1613  BP: 122/69 114/64 122/76 128/82  Pulse: 80 69 65 78  Resp: 16 15 16 18   Temp: 97.7 F (36.5 C) (!) 97.5 F (36.4 C) 97.9 F (36.6 C) 98.2 F (36.8 C)  TempSrc:      SpO2: 96% 92% 98% 98%  Weight:      Height:       General.  Frail elderly lady, in no acute distress. Pulmonary.  Lungs clear bilaterally, normal respiratory effort. CV.  Regular rate and rhythm, no JVD, rub or murmur. Abdomen.  Soft, nontender, nondistended, BS positive. CNS.  Alert and oriented to self .  No focal neurologic deficit. Extremities.  No edema, no cyanosis, pulses intact and symmetrical.   Data Reviewed:  Reviewed  Family Communication: Husband at bedside.  Disposition: Status is: Inpatient Remains inpatient appropriate because: Ongoing recovery from aspiration pneumonia and also been planned for placement.  Case manager is working on finding the placement.  Planned Discharge Destination: Skilled nursing facility  DVT prophylaxis.  Lovenox  Time spent: 39 minutes  This record has been created using Conservation officer, historic buildings. Errors have been sought and corrected,but may not always be located. Such creation errors do not reflect on the standard  of care.   Author: Amaryllis Dare, MD 01/03/2024 4:24 PM  For on call review [email or web address].

## 2024-01-03 NOTE — Plan of Care (Signed)
 Pt irritable at the start of my shift, later became calm; Pt slept all night.  Problem: Clinical Measurements: Goal: Ability to maintain clinical measurements within normal limits will improve Outcome: Progressing   Problem: Coping: Goal: Level of anxiety will decrease Outcome: Progressing   Problem: Elimination: Goal: Will not experience complications related to bowel motility Outcome: Progressing   Problem: Pain Managment: Goal: General experience of comfort will improve and/or be controlled Outcome: Progressing   Problem: Respiratory: Goal: Ability to maintain adequate ventilation will improve Outcome: Progressing

## 2024-01-03 NOTE — Progress Notes (Signed)
 PT Cancellation Note  Patient Details Name: MABLE DARA MRN: [id number] DOB: [birth year]   Cancelled Treatment:    Reason Eval/Treat Not Completed: Medical issues which prohibited therapy Informed patient had pulled out port. Will attempt later today as able.    Dewaine Morocho 01/03/2024, 10:52 AM

## 2024-01-03 NOTE — Progress Notes (Signed)
 Occupational Therapy Treatment Patient Details Name: Anne [name] MRN: [id number] DOB: [birth year] Today's Date: 01/03/2024   History of present illness Patient is a 73 year old female with acute on chronic hypoxic respiratory failure secondary to aspiration event. 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   OT comments  Patient seen for OT treatment on this date. Upon arrival to room patient asleep in bed, sitter reporting difficulty arousing patient. OT opened blinds and introduced self, patient more alert and agreeable. Transitioned to EOB without physical A; required cues/prompting but able to return demo request for transferring to [facility] with min A, toileting and grooming tasks; patient had been incontinent in bed and was agreeable to sit in recliner. Patient not wearing O2 at start of tx, O2 82%, placed nasal canula on patient and O2 improved to 96%; sitter reporting increased agitation this morning. Patient ended treatment in recliner with sitter present. Patient making good progress toward goals, will continue to follow POC. Discharge recommendation remains appropriate.        If plan is discharge home, recommend the following:  A lot of help with walking and/or transfers;A lot of help with bathing/dressing/bathroom;Assist for transportation;Direct supervision/assist for medications management;Direct supervision/assist for financial management;Supervision due to cognitive status;Assistance with cooking/housework;Assistance with feeding   Equipment Recommendations  Other (comment)    Recommendations for Other Services      Precautions / Restrictions Precautions Precautions: Fall Recall of Precautions/Restrictions: Impaired Restrictions Weight Bearing Restrictions Per Provider Order: No       Mobility Bed Mobility Overal bed mobility: Needs  Assistance Bed Mobility: Supine to Sit     Supine to sit: Min assist     General bed mobility comments: cues for sequencing    Transfers Overall transfer level: Needs assistance Equipment used: Rolling walker (2 wheels) Transfers: Sit to/from Stand Sit to Stand: Min assist, Contact guard assist           General transfer comment: near LOB while standing for pericare     Balance Overall balance assessment: Needs assistance Sitting-balance support: Feet supported, Bilateral upper extremity supported Sitting balance-Leahy Scale: Good   Postural control: Posterior lean Standing balance support: During functional activity, No upper extremity supported Standing balance-Leahy Scale: Poor Standing balance comment: loss of balance with toileting tasks, requiring Min A to correct posterior loss of balance                           ADL either performed or assessed with clinical judgement   ADL Overall ADL's : Needs assistance/impaired Eating/Feeding: Set up           Lower Body Bathing: Maximal assistance;Sit to/from stand   Upper Body Dressing : Maximal assistance;Sitting       Toilet Transfer: Minimal assistance;BSC/3in1;Rolling walker (2 wheels)   Toileting- Clothing Manipulation and Hygiene: Maximal assistance;Sit to/from stand;+2 for safety/equipment              Extremity/Trunk Assessment Upper Extremity Assessment Upper Extremity Assessment: Generalized weakness            Vision       Perception     Praxis     Communication Communication Communication: Impaired Factors Affecting Communication: Hearing impaired   Cognition Arousal: Lethargic Behavior During Therapy: WFL for tasks assessed/performed Cognition: Cognition impaired, History of cognitive impairments   Orientation impairments: Place, Time, Situation Awareness: Intellectual awareness  impaired Memory impairment (select all impairments): Short-term memory,  Non-declarative long-term memory, Working memory, Engineer, structural memory Attention impairment (select first level of impairment): Focused attention Executive functioning impairment (select all impairments): Initiation, Organization, Sequencing, Reasoning, Problem solving OT - Cognition Comments: dx of advanced dementia                 Following commands: Impaired Following commands impaired: Follows one step commands inconsistently      Cueing   Cueing Techniques: Verbal cues, Visual cues, Tactile cues, Gestural cues  Exercises      Shoulder Instructions       General Comments      Pertinent Vitals/ Pain       Pain Assessment Pain Assessment: No/denies pain  Home Living                                          Prior Functioning/Environment              Frequency  Min 2X/week        Progress Toward Goals  OT Goals(current goals can now be found in the care plan section)  Progress towards OT goals: Progressing toward goals  Acute Rehab OT Goals Patient Stated Goal: none stated OT Goal Formulation: Patient unable to participate in goal setting Time For Goal Achievement: 01/12/24 Potential to Achieve Goals: Fair  Plan      Co-evaluation                 AM-PAC OT 6 Clicks Daily Activity     Outcome Measure   Help from another person eating meals?: A Lot Help from another person taking care of personal grooming?: A Lot Help from another person toileting, which includes using toliet, bedpan, or urinal?: A Lot Help from another person bathing (including washing, rinsing, drying)?: Total Help from another person to put on and taking off regular upper body clothing?: A Lot Help from another person to put on and taking off regular lower body clothing?: Total 6 Click Score: 10    End of Session Equipment Utilized During Treatment: Oxygen;Gait belt;Rolling walker (2 wheels)  OT Visit Diagnosis: Unsteadiness on feet  (R26.81);Other abnormalities of gait and mobility (R26.89);Muscle weakness (generalized) (M62.81);History of falling (Z91.81);Other symptoms and signs involving cognitive function   Activity Tolerance Patient tolerated treatment well   Patient Left in chair;with nursing/sitter in room   Nurse Communication          Time: 9167-9152 OT Time Calculation (min): 15 min  Charges: OT General Charges $OT Visit: 1 Visit OT Treatments $Self Care/Home Management : 8-22 mins  Rogers Clause, OT/L MSOT, 01/03/2024

## 2024-01-03 NOTE — Progress Notes (Signed)
 Patient very agitated. Patient got up and tried to run out of the room sitter tried to help patient back to bed and patient tried to bite the sitter. I ran in the room to try to verbally deescalate patient back to bed and educate but patient stated she wanted to kill the sitter. IV halidol given. Reevaluated 15 min later and pt sleeping.

## 2024-01-04 DIAGNOSIS — J690 Pneumonitis due to inhalation of food and vomit: Secondary | ICD-10-CM | POA: Diagnosis not present

## 2024-01-04 DIAGNOSIS — R0902 Hypoxemia: Secondary | ICD-10-CM

## 2024-01-04 DIAGNOSIS — E440 Moderate protein-calorie malnutrition: Secondary | ICD-10-CM | POA: Diagnosis not present

## 2024-01-04 MED ORDER — ENSURE PLUS HIGH PROTEIN PO LIQD: 237.0000 mL | Freq: Two times a day (BID) | ORAL | 3 refills | Status: AC

## 2024-01-04 MED ORDER — POLYETHYLENE GLYCOL 3350 17 G PO PACK: 17.0000 g | PACK | Freq: Every day | ORAL | 0 refills | Status: AC | PRN

## 2024-01-04 NOTE — Plan of Care (Signed)
 Pt slept through the night; took meds with apple sauce and back to sleeping. 1:1 sitter continues. Problem: Clinical Measurements: Goal: Ability to maintain clinical measurements within normal limits will improve 01/04/2024 0708 by Melven Odilia PARAS, RN Outcome: Progressing 01/04/2024 0651 by Melven Odilia PARAS, RN Outcome: Progressing   Problem: Safety: Goal: Ability to remain free from injury will improve 01/04/2024 0708 by Melven Odilia PARAS, RN Outcome: Progressing 01/04/2024 0651 by Melven Odilia PARAS, RN Outcome: Progressing   Problem: Pain Managment: Goal: General experience of comfort will improve and/or be controlled 01/04/2024 0708 by Melven Odilia PARAS, RN Outcome: Progressing 01/04/2024 0651 by Melven Odilia PARAS, RN Outcome: Progressing   Problem: Respiratory: Goal: Ability to maintain adequate ventilation will improve 01/04/2024 0708 by Melven Odilia PARAS, RN Outcome: Progressing 01/04/2024 0651 by Melven Odilia PARAS, RN Outcome: Progressing

## 2024-01-04 NOTE — Discharge Summary (Signed)
 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  On presentation, she was hypoxic and required oxygen supplementation.  CT chest did not show any acute findings.  She became combative in the emergency department and was given Ativan .  Currently respiratory status stable.  PT/OT consulted.  11/27: Hemodynamically stable on 2 L of oxygen.  Pending insurance authorization for SNF.  Might need long-term placement.  DSS is involved.  11/28: Remained hemodynamically stable, wants to go home with husband.  Per husband he also wants her to come home.  TOC to discuss with her DSS worker.  11/29: Remained hemodynamically stable with intermittent agitation which is her baseline due to advanced dementia.  Case was discussed again with TOC, DSS is not having any guardianship and patient is being discharged home with husband.  Discussed with husband at day prior and he would like her to come back home.  Patient remained stable on 2 L of oxygen which is her baseline.  There was no concern of pneumonia, pneumonia ruled out and patient did not require antibiotics.  Patient with multiple significant life limiting comorbidities and will remain high risk for readmission and mortality.  She  will continue on current medications and need to have a close follow-up with her providers for further assistance.       Consultants: None Procedures performed: None Disposition: Home health Diet recommendation:  Discharge Diet Orders (From admission, onward)     Start     Ordered   01/04/24 0000  Diet - low sodium heart healthy        01/04/24 1058           Dysphagia type 2 thin Liquid DISCHARGE MEDICATION: Allergies as of 01/04/2024       Reactions   Bee Venom Swelling   Honey Bee Venom Other (See Comments)   Unknown rxn        Medication List     TAKE these medications    amLODipine  5 MG tablet Commonly known as: NORVASC  Take 1 tablet (5 mg total) by mouth daily.   Daily-Vite Multivitamin Tabs Take 1 tablet by mouth daily.   divalproex  250 MG DR tablet Commonly known as: DEPAKOTE  Take 1 tablet (250 mg total) by mouth 2 (two) times daily.   feeding supplement Liqd Take 237 mLs by mouth 2 (two) times daily between meals.   levothyroxine  112 MCG tablet Commonly known as: SYNTHROID  Take 1 tablet (112 mcg total) by mouth daily before breakfast.   memantine  10 MG tablet Commonly known as: NAMENDA  Take 1 tablet (10 mg total) by mouth 2 (two) times daily.   Nayzilam  5 MG/0.1ML Soln Generic drug: Midazolam  Place 5 mg into the nose as needed.   permethrin  5 % cream Commonly known as: ELIMITE  Apply from the neck down to soles of feet, wash off after 8-12 hours.  polyethylene glycol 17 g packet Commonly known as: MIRALAX  / GLYCOLAX  Take 17 g by mouth daily as needed for mild constipation or moderate constipation.   QUEtiapine  50 MG tablet Commonly known as: SEROQUEL  Take 2 tablets (100 mg total) by mouth in the morning.   rivastigmine  3 MG capsule Commonly known as: EXELON  Take 1 capsule (3 mg total) by mouth 2 (two) times daily.   rosuvastatin  20 MG tablet Commonly known as: CRESTOR  Take 1 tablet (20 mg total) by mouth daily.   sertraline   100 MG tablet Commonly known as: ZOLOFT  Take 1 tablet (100 mg total) by mouth at bedtime.   traZODone  50 MG tablet Commonly known as: DESYREL  Take 1 tablet (50 mg total) by mouth at bedtime.   triamcinolone  cream 0.5 % Commonly known as: KENALOG  Apply 1 Application topically 2 (two) times daily.   zonisamide  100 MG capsule Commonly known as: ZONEGRAN  Take 1 capsule (100 mg total) by mouth 2 (two) times daily.        Contact information for follow-up providers     Gretel App, NP Follow up.   Specialty: Nurse Practitioner Why: hospital follow up Contact information: [address] [name] [address] [phone]              Contact information for after-discharge care     Destination     [facility] .   Service: Skilled Nursing Contact information: [address] [place]  [address] [phone]                    Discharge Exam: Anne [name]   12/28/23 1952  Weight: 70 kg   General.  Frail and malnourished elderly lady, in no acute distress. Pulmonary.  Lungs clear bilaterally, normal respiratory effort. CV.  Regular rate and rhythm, no JVD, rub or murmur. Abdomen.  Soft, nontender, nondistended, BS positive. CNS.  Alert and oriented to self.  No focal neurologic deficit. Extremities.  No edema, no cyanosis, pulses intact and symmetrical.  Condition at discharge: stable  The results of significant diagnostics from this hospitalization (including imaging, microbiology, ancillary and laboratory) are listed below for reference.   Imaging Studies: CT Chest Wo Contrast Result Date: 12/28/2023 EXAM: CT CHEST WITHOUT CONTRAST 12/28/2023 11:30:00 PM TECHNIQUE: CT of the chest was performed without the administration of intravenous contrast. Multiplanar reformatted images are provided for review. Automated exposure control, iterative reconstruction, and/or weight based adjustment of the mA/kV was utilized to  reduce the radiation dose to as low as reasonably achievable. COMPARISON: 01/01/2022, 06/02/2023, plain film from earlier in the same day. CLINICAL HISTORY: Recent choking episode. FINDINGS: MEDIASTINUM: Heart: Coronary calcifications are seen. No cardiac enlargement is noted. The central airways are clear. The thoracic aorta shows atherosclerotic calcifications without aneurysmal dilatation. The thoracic inlet is within normal limits. The esophagus, as visualized, is within normal limits. LYMPH NODES: No mediastinal, hilar or axillary lymphadenopathy. LUNGS AND PLEURA: Lungs are well aerated bilaterally. Minimal basilar atelectasis is seen. A few small nodules are noted the largest of which lies on image number 47 of series 4. This is stable from the prior exam as well as multiple previous exams dating back to at least 2023 consistent with benign etiology. No further follow-up is recommended. No new sizable parenchymal nodules are seen. No pleural effusion or pneumothorax. SOFT TISSUES/BONES: Degenerative changes of the thoracic spine are seen. No acute rib abnormality is noted. Right chest wall port is again noted. UPPER ABDOMEN: Limited images of  the upper abdomen demonstrates no acute abnormality. IMPRESSION: 1. No acute cardiopulmonary findings to explain the recent choking episode. Electronically signed by: Oneil Devonshire MD 12/28/2023 11:45 PM EST RP Workstation: [id number]   DG Chest 2 View Result Date: 12/28/2023 EXAM: 2 VIEW(S) XRAY OF THE CHEST 12/28/2023 08:15:00 PM COMPARISON: 06/02/2023 CLINICAL HISTORY: aspiration, eval infiltrate FINDINGS: LINES, TUBES AND DEVICES: Right port-a-cath tip is in the SVC, unchanged. LUNGS AND PLEURA: No focal pulmonary opacity. No pleural effusion. No pneumothorax. HEART AND MEDIASTINUM: Aortic atherosclerosis. No acute abnormality of the cardiac silhouette. BONES AND SOFT TISSUES: No acute osseous abnormality. IMPRESSION: 1. No acute cardiopulmonary process.  Electronically signed by: Franky Crease MD 12/28/2023 08:38 PM EST RP Workstation: [id number]   CT Cervical Spine Wo Contrast Result Date: 12/20/2023 EXAM: CT CERVICAL SPINE WITHOUT CONTRAST 12/20/2023 01:59:14 PM TECHNIQUE: CT of the cervical spine was performed without the administration of intravenous contrast. Multiplanar reformatted images are provided for review. Automated exposure control, iterative reconstruction, and/or weight based adjustment of the mA/kV was utilized to reduce the radiation dose to as low as reasonably achievable. COMPARISON: CTA of the cervical spine 06/02/2023. CLINICAL HISTORY: Neck trauma (Age >= 65y). FINDINGS: CERVICAL SPINE: BONES AND ALIGNMENT: Slight retrolisthesis at C4-C5 and C5-C6 and stable. Straightening of the normal cervical lordosis is stable. The patient is edentulous. No acute fracture or traumatic malalignment. DEGENERATIVE CHANGES: Uncovertebral and facet changes are most evident at C4-C5 and C5-C6. SOFT TISSUES: Atherosclerotic calcifications are present in the cavernous carotid arteries bilaterally and at the dural margin of both vertebral arteries. No hyperdense vessel is present. A right IJ catheter is incompletely visualized. No prevertebral soft tissue swelling. IMPRESSION: 1. No acute abnormality of the cervical spine related to the reported neck trauma. 2. Stable straightening of the normal cervical lordosis. 3. Slight retrolisthesis at C4-5 and C5-6 with associated uncovertebral and facet arthropathy. Electronically signed by: Lonni Necessary MD 12/20/2023 02:05 PM EST RP Workstation: [id number]   CT HEAD WO CONTRAST ( ) Result Date: 12/20/2023 EXAM: CT HEAD WITHOUT CONTRAST 12/20/2023 01:59:14 PM TECHNIQUE: CT of the head was performed without the administration of intravenous contrast. Automated exposure control, iterative reconstruction, and/or weight based adjustment of the mA/kV was utilized to reduce the radiation dose to as low as  reasonably achievable. COMPARISON: 06/02/2023 CLINICAL HISTORY: Headache, new onset (Age >= 51y) FINDINGS: BRAIN AND VENTRICLES: No acute hemorrhage. No evidence of acute infarct. No hydrocephalus. 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 No mass effect or midline shift. Stable atrophy and chronic small vessel ischemic change. Atherosclerosis of skullbase vasculature without hyperdense vessel or abnormal calcification. ORBITS: Bilateral cataract resection noted. SINUSES: Chronic mucosal thickening of left sphenoid sinus. Stable small osteoma in left ethmoid air cells. SOFT TISSUES AND SKULL: No acute soft tissue abnormality. No skull fracture. IMPRESSION: 1. No acute intracranial abnormality. 2. 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 3. Stable cerebral atrophy and chronic small vessel ischemic changes. Electronically signed by: Lonni Necessary MD 12/20/2023 02:03 PM EST RP Workstation: [id number]   DG HIPS BILAT WITH PELVIS MIN 5 VIEWS Result Date: 12/20/2023 EXAM: 5 OR MORE VIEW(S) XRAY OF THE BILATERAL HIP 12/20/2023 12:28:37 PM COMPARISON: 03/05/2023 CLINICAL HISTORY: [id number] Fall [id number] FINDINGS: BONES AND JOINTS: No acute fracture or focal osseous lesion. Mild bilateral femoroacetabular subchondral sclerosis and cystic change with mild bilateral superolateral acetabular degenerative osteophytosis consistent with osteoarthritis. Mild bilateral sacroiliac subchondral sclerosis. SOFT  TISSUES: Atherosclerotic vascular calcifications. IMPRESSION: 1. No acute findings. Electronically signed by: Ryan Chess MD 12/20/2023 12:51 PM EST RP Workstation: [id number]    Microbiology: Results for orders placed or performed during the hospital encounter of 05/29/23  Resp panel by RT-PCR (RSV, Flu A&B, Covid) Anterior Nasal Swab     Status: None   Collection Time: 06/02/23  8:36 AM    Specimen: Anterior Nasal Swab  Result Value Ref Range Status   SARS Coronavirus 2 by RT PCR NEGATIVE NEGATIVE Final    Comment: (NOTE) SARS-CoV-2 target nucleic acids are NOT DETECTED.  The SARS-CoV-2 RNA is generally detectable in upper respiratory specimens during the acute phase of infection. The lowest concentration of SARS-CoV-2 viral copies this assay can detect is 138 copies/mL. A negative result does not preclude SARS-Cov-2 infection and should not be used as the sole basis for treatment or other patient management decisions. A negative result may occur with  improper specimen collection/handling, submission of specimen other than nasopharyngeal swab, presence of viral mutation(s) within the areas targeted by this assay, and inadequate number of viral copies(<138 copies/mL). A negative result must be combined with clinical observations, patient history, and epidemiological information. The expected result is Negative.  Fact Sheet for Patients:  bloggercourse.com  Fact Sheet for Healthcare Providers:  seriousbroker.it  This test is no t yet approved or cleared by the [place]  FDA and  has been authorized for detection and/or diagnosis of SARS-CoV-2 by FDA under an Emergency Use Authorization (EUA). This EUA will remain  in effect (meaning this test can be used) for the duration of the COVID-19 declaration under Section 564(b)(1) of the Act, 21 U.S.C.section 360bbb-3(b)(1), unless the authorization is terminated  or revoked sooner.       Influenza A by PCR NEGATIVE NEGATIVE Final   Influenza B by PCR NEGATIVE NEGATIVE Final    Comment: (NOTE) The Xpert Xpress SARS-CoV-2/FLU/RSV plus assay is intended as an aid in the diagnosis of influenza from Nasopharyngeal swab specimens and should not be used as a sole basis for treatment. Nasal washings and aspirates are unacceptable for Xpert Xpress  SARS-CoV-2/FLU/RSV testing.  Fact Sheet for Patients: bloggercourse.com  Fact Sheet for Healthcare Providers: seriousbroker.it  This test is not yet approved or cleared by the [place]  FDA and has been authorized for detection and/or diagnosis of SARS-CoV-2 by FDA under an Emergency Use Authorization (EUA). This EUA will remain in effect (meaning this test can be used) for the duration of the COVID-19 declaration under Section 564(b)(1) of the Act, 21 U.S.C. section 360bbb-3(b)(1), unless the authorization is terminated or revoked.     Resp Syncytial Virus by PCR NEGATIVE NEGATIVE Final    Comment: (NOTE) Fact Sheet for Patients: bloggercourse.com  Fact Sheet for Healthcare Providers: seriousbroker.it  This test is not yet approved or cleared by the [place]  FDA and has been authorized for detection and/or diagnosis of SARS-CoV-2 by FDA under an Emergency Use Authorization (EUA). This EUA will remain in effect (meaning this test can be used) for the duration of the COVID-19 declaration under Section 564(b)(1) of the Act, 21 U.S.C. section 360bbb-3(b)(1), unless the authorization is terminated or revoked.  Performed at [facility], [address] [place], [name] [address]   Urine Culture     Status: Abnormal   Collection Time: 06/02/23  4:47 PM   Specimen: Urine, Clean Catch  Result Value Ref Range Status   Specimen Description   Final    URINE, CLEAN CATCH  Performed at [facility], [address] [place], [name] [address]    Special Requests   Final    NONE Performed at [facility], [address] [place], [name] [address]    Culture 20,000 COLONIES/mL STAPHYLOCOCCUS EPIDERMIDIS (A)  Final   Report Status 06/04/2023 FINAL  Final   Organism ID, Bacteria STAPHYLOCOCCUS EPIDERMIDIS (A)  Final      Susceptibility    Staphylococcus epidermidis - MIC*    CIPROFLOXACIN  <=0.5 SENSITIVE Sensitive     GENTAMICIN  <=0.5 SENSITIVE Sensitive     NITROFURANTOIN <=16 SENSITIVE Sensitive     OXACILLIN >=4 RESISTANT Resistant     TETRACYCLINE <=1 SENSITIVE Sensitive     VANCOMYCIN  <=0.5 SENSITIVE Sensitive     TRIMETH/SULFA <=10 SENSITIVE Sensitive     RIFAMPIN <=0.5 SENSITIVE Sensitive     Inducible Clindamycin  NEGATIVE Sensitive     * 20,000 COLONIES/mL STAPHYLOCOCCUS EPIDERMIDIS   *Note: Due to a large number of results and/or encounters for the requested time period, some results have not been displayed. A complete set of results can be found in Results Review.    Labs: CBC: Recent Labs  Lab 12/28/23 2003 12/29/23 0513 12/30/23 0621  WBC 5.3 7.4 7.8  NEUTROABS 3.4  --   --   HGB 13.1 12.2 12.5  HCT 42.2 38.7 39.4  MCV 93.4 92.8 92.1  PLT 126* 123* 111*   Basic Metabolic Panel: Recent Labs  Lab 12/28/23 2003 12/29/23 0513 12/30/23 0621  NA 143 144 140  K 3.2* 3.8 3.6  CL 103 108 107  CO2 29 27 26   GLUCOSE 95 95 87  BUN 16 12 13   CREATININE 0.84 0.69 0.58  CALCIUM  9.0 8.6* 9.0   Liver Function Tests: Recent Labs  Lab 12/28/23 2003 12/30/23 0621  AST 22 18  ALT 12 8  ALKPHOS 75 74  BILITOT 0.3 0.5  PROT 7.1 6.5  ALBUMIN 4.2 3.7   CBG: No results for input(s): GLUCAP in the last 168 hours.  Discharge time spent: greater than 30 minutes.  This record has been created using Conservation officer, historic buildings. Errors have been sought and corrected,but may not always be located. Such creation errors do not reflect on the standard of care.   Signed: Amaryllis Dare, MD [facility] 01/04/2024

## 2024-01-04 NOTE — Progress Notes (Signed)
 SATURATION QUALIFICATIONS: (This note is used to comply with regulatory documentation for home oxygen)  Patient Saturations on Room Air at Rest = 89%  Patient Saturations on Room Air while Ambulating = 84%  Patient Saturations on 2 Liters of oxygen while Ambulating = 92%  Please briefly explain why patient needs home oxygen: Low oxygen saturation while ambulating

## 2024-01-04 NOTE — Progress Notes (Signed)
 Mobility Specialist - Progress Note     01/04/24 1140  Mobility  Activity Ambulated with assistance;Stood at bedside;Dangled on edge of bed  Level of Assistance Minimal assist, patient does 75% or more  Assistive Device Front wheel walker  Distance Ambulated (ft) 30 ft  Range of Motion/Exercises Active  Activity Response Tolerated well  Mobility Referral Yes  Mobility visit 1 Mobility  Mobility Specialist Start Time (ACUTE ONLY) 1000   Pt resting in bed on 2L upon entry. Pt agreeable to participate in mobility but patient is less vocal with MS today (responded to questions with head nods and smiles or frowns). Followed verbal commands. Pt ambulates to hallway and required MinA manual and verbal directional cuing with RW and constant redirecting. Pt became weaker at the 30 ft mark. Took seated rest break before being wheeled back to room. Pt left in recliner with needs in reach. Sitter present at bedside.   Guido Rumble Mobility Specialist 01/04/24, 11:58 AM

## 2024-01-04 NOTE — TOC Progression Note (Addendum)
 Transition of Care [facility]) - Progression Note    Patient Details  Name: Anne [name] MRN: [id number] Date of Birth: [birth year]  [facility] CM/SW Contact  Esiquio Boesen L Shaunda [place], [name] Phone Number: 01/04/2024, 12:02 PM  Clinical Narrative:     Home Health orders entered. Patient and spouse would like patient to discharge home. CSW sent referral out in portal. Patient will receive [facility] services from [facility]. Services have been confirmed with [facility] and will begin 24-48 hours after discharge.   A referral form [facility] has been made.   [facility] CSW, [place], will follow-up with family to provide additional supports.   DSS SW, Leonor Ply, has been notified of discharge.   Oxygen has been ordered through [facility] and is being processed. Patient will receive a portable tank for discharge and she will receive home set up for oxygen.   TOC signing off.                     Expected Discharge Plan and Services         Expected Discharge Date: 01/04/24                                     Social Drivers of Health (SDOH) Interventions SDOH Screenings   Food Insecurity: No Food Insecurity (12/30/2023)  Housing: Low Risk  (12/30/2023)  Transportation Needs: No Transportation Needs (12/30/2023)  Utilities: Not At Risk (12/30/2023)  Alcohol Screen: Low Risk  (02/22/2023)  Depression (PHQ2-9): Low Risk  (11/21/2023)  Financial Resource Strain: High Risk (06/23/2023)   Received from [facility]  Physical Activity: Inactive (02/22/2023)  Social Connections: Patient Unable To Answer (12/30/2023)  Stress: Patient Unable To Answer (02/22/2023)  Tobacco Use: Medium Risk (12/19/2023)  Health Literacy: Patient Unable To Answer (02/22/2023)    Readmission Risk Interventions     No data to display

## 2024-01-04 NOTE — Plan of Care (Signed)
  Problem: Clinical Measurements: Goal: Ability to maintain clinical measurements within normal limits will improve Outcome: Progressing   Problem: Pain Managment: Goal: General experience of comfort will improve and/or be controlled Outcome: Progressing   Problem: Safety: Goal: Ability to remain free from injury will improve Outcome: Progressing   Problem: Respiratory: Goal: Ability to maintain adequate ventilation will improve Outcome: Progressing

## 2024-01-04 NOTE — Discharge Instructions (Addendum)
 Home Health has been arranged with [facility]. Start of care will be 24 to 48 hours after discharge. Home Health will provide services in the home. They will provide physical therapy, occupational therapy, speech therapy, and nursing. If you do not here from the agency please contact them at [phone].   A referral for [facility] has also been made.   Building Control Surveyor  Offer one-on-one health support Help with basic needs like transportation, housing, food, and medicine Connect people with doctors and hospitals Guide patients through the health care system Teach ways to stay healthy and manage illnesses Support overall well-being, including mental and physical health Check blood pressure, blood sugar, and other key health signs Help prevent and manage high blood pressure and diabetes Organize free health check-ups and flu shot clinics Help people find a regular doctor Reduce unnecessary ER visits Prevent repeat hospital stays

## 2024-01-05 ENCOUNTER — Emergency Department
Admission: EM | Admit: 2024-01-05 | Discharge: 2024-01-06 | Disposition: A | Attending: Emergency Medicine | Admitting: Emergency Medicine

## 2024-01-05 ENCOUNTER — Emergency Department

## 2024-01-05 ENCOUNTER — Other Ambulatory Visit: Payer: Self-pay

## 2024-01-05 DIAGNOSIS — F03911 Unspecified dementia, unspecified severity, with agitation: Secondary | ICD-10-CM | POA: Diagnosis not present

## 2024-01-05 DIAGNOSIS — F0390 Unspecified dementia without behavioral disturbance: Secondary | ICD-10-CM | POA: Diagnosis not present

## 2024-01-05 DIAGNOSIS — Z85118 Personal history of other malignant neoplasm of bronchus and lung: Secondary | ICD-10-CM | POA: Insufficient documentation

## 2024-01-05 DIAGNOSIS — R4182 Altered mental status, unspecified: Secondary | ICD-10-CM | POA: Diagnosis present

## 2024-01-05 DIAGNOSIS — Y92009 Unspecified place in unspecified non-institutional (private) residence as the place of occurrence of the external cause: Secondary | ICD-10-CM | POA: Diagnosis not present

## 2024-01-05 DIAGNOSIS — S50311A Abrasion of right elbow, initial encounter: Secondary | ICD-10-CM | POA: Diagnosis not present

## 2024-01-05 DIAGNOSIS — W01198A Fall on same level from slipping, tripping and stumbling with subsequent striking against other object, initial encounter: Secondary | ICD-10-CM | POA: Diagnosis not present

## 2024-01-05 DIAGNOSIS — Z85830 Personal history of malignant neoplasm of bone: Secondary | ICD-10-CM | POA: Diagnosis not present

## 2024-01-05 DIAGNOSIS — S0990XA Unspecified injury of head, initial encounter: Secondary | ICD-10-CM | POA: Diagnosis not present

## 2024-01-05 DIAGNOSIS — S59901A Unspecified injury of right elbow, initial encounter: Secondary | ICD-10-CM | POA: Diagnosis present

## 2024-01-05 DIAGNOSIS — I10 Essential (primary) hypertension: Secondary | ICD-10-CM | POA: Insufficient documentation

## 2024-01-05 LAB — CBC WITH DIFFERENTIAL/PLATELET
Abs Immature Granulocytes: 0.06 K/uL (ref 0.00–0.07)
Basophils Absolute: 0 K/uL (ref 0.0–0.1)
Basophils Relative: 1 %
Eosinophils Absolute: 0.3 K/uL (ref 0.0–0.5)
Eosinophils Relative: 5 %
HCT: 38.6 % (ref 36.0–46.0)
Hemoglobin: 11.9 g/dL — ABNORMAL LOW (ref 12.0–15.0)
Immature Granulocytes: 1 %
Lymphocytes Relative: 17 %
Lymphs Abs: 0.9 K/uL (ref 0.7–4.0)
MCH: 28.9 pg (ref 26.0–34.0)
MCHC: 30.8 g/dL (ref 30.0–36.0)
MCV: 93.7 fL (ref 80.0–100.0)
Monocytes Absolute: 0.5 K/uL (ref 0.1–1.0)
Monocytes Relative: 9 %
Neutro Abs: 3.6 K/uL (ref 1.7–7.7)
Neutrophils Relative %: 67 %
Platelets: 143 K/uL — ABNORMAL LOW (ref 150–400)
RBC: 4.12 MIL/uL (ref 3.87–5.11)
RDW: 14.9 % (ref 11.5–15.5)
WBC: 5.4 K/uL (ref 4.0–10.5)
nRBC: 0 % (ref 0.0–0.2)

## 2024-01-05 LAB — BASIC METABOLIC PANEL WITH GFR
Anion gap: 9 (ref 5–15)
BUN: 26 mg/dL — ABNORMAL HIGH (ref 8–23)
CO2: 31 mmol/L (ref 22–32)
Calcium: 9.1 mg/dL (ref 8.9–10.3)
Chloride: 101 mmol/L (ref 98–111)
Creatinine, Ser: 0.79 mg/dL (ref 0.44–1.00)
GFR, Estimated: >60 mL/min (ref >60)
Glucose, Bld: 103 mg/dL — ABNORMAL HIGH (ref 70–99)
Potassium: 4.1 mmol/L (ref 3.5–5.1)
Sodium: 141 mmol/L (ref 135–145)

## 2024-01-05 LAB — AMMONIA: Ammonia: 46 umol/L — ABNORMAL HIGH (ref 9–35)

## 2024-01-05 MED ORDER — LORAZEPAM 2 MG/ML IJ SOLN
2.0000 mg | Freq: Once | INTRAMUSCULAR | Status: AC
  Administered 2024-01-05: 2 mg via INTRAMUSCULAR
  Filled 2024-01-05: qty 1

## 2024-01-05 MED ORDER — LORAZEPAM 2 MG/ML IJ SOLN: 2.0000 mg | Freq: Once | INTRAMUSCULAR | Status: DC

## 2024-01-05 MED ORDER — ZIPRASIDONE MESYLATE 20 MG IM SOLR
10.0000 mg | Freq: Once | INTRAMUSCULAR | Status: AC
  Administered 2024-01-05: 10 mg via INTRAMUSCULAR
  Filled 2024-01-05: qty 20

## 2024-01-05 NOTE — ED Notes (Signed)
 This tech and [place], [place] heard pt bed alarm. Pt was sitting at the edge of the bed trying to get OOB. Pt is trying to leave. Pt stated, there is nothing wrong with me. Pt assisted to toilet in rm. UA obtained and sent to the lab. Hand hygiene preformed. Pt assisted back in the bed. Bed alarm in place. Call light within reach. Pt has no further needs at this time.
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
## 2024-01-05 NOTE — ED Notes (Signed)
 Attempted to call Johnny to come pick up patient for discharge
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
## 2024-01-06 ENCOUNTER — Emergency Department

## 2024-01-06 ENCOUNTER — Emergency Department
Admission: EM | Admit: 2024-01-06 | Discharge: 2024-01-07 | Disposition: A | Attending: Emergency Medicine | Admitting: Emergency Medicine

## 2024-01-06 ENCOUNTER — Encounter: Payer: Self-pay | Admitting: *Deleted

## 2024-01-06 ENCOUNTER — Other Ambulatory Visit: Payer: Self-pay

## 2024-01-06 ENCOUNTER — Telehealth: Payer: Self-pay

## 2024-01-06 DIAGNOSIS — F0390 Unspecified dementia without behavioral disturbance: Secondary | ICD-10-CM | POA: Insufficient documentation

## 2024-01-06 DIAGNOSIS — I10 Essential (primary) hypertension: Secondary | ICD-10-CM | POA: Insufficient documentation

## 2024-01-06 DIAGNOSIS — S0990XA Unspecified injury of head, initial encounter: Secondary | ICD-10-CM | POA: Insufficient documentation

## 2024-01-06 DIAGNOSIS — Z85830 Personal history of malignant neoplasm of bone: Secondary | ICD-10-CM | POA: Insufficient documentation

## 2024-01-06 DIAGNOSIS — W01198A Fall on same level from slipping, tripping and stumbling with subsequent striking against other object, initial encounter: Secondary | ICD-10-CM | POA: Insufficient documentation

## 2024-01-06 DIAGNOSIS — W19XXXA Unspecified fall, initial encounter: Secondary | ICD-10-CM

## 2024-01-06 DIAGNOSIS — R4182 Altered mental status, unspecified: Secondary | ICD-10-CM | POA: Diagnosis not present

## 2024-01-06 DIAGNOSIS — Z85118 Personal history of other malignant neoplasm of bronchus and lung: Secondary | ICD-10-CM | POA: Insufficient documentation

## 2024-01-06 DIAGNOSIS — S50311A Abrasion of right elbow, initial encounter: Secondary | ICD-10-CM | POA: Insufficient documentation

## 2024-01-06 DIAGNOSIS — Y92009 Unspecified place in unspecified non-institutional (private) residence as the place of occurrence of the external cause: Secondary | ICD-10-CM | POA: Insufficient documentation

## 2024-01-06 MED ORDER — LEVOTHYROXINE SODIUM 112 MCG PO TABS
112.0000 ug | ORAL_TABLET | Freq: Every day | ORAL | Status: DC
  Administered 2024-01-07: 112 ug via ORAL
  Filled 2024-01-06: qty 1

## 2024-01-06 MED ORDER — ROSUVASTATIN CALCIUM 20 MG PO TABS
20.0000 mg | ORAL_TABLET | Freq: Every day | ORAL | Status: DC
  Administered 2024-01-07: 20 mg via ORAL
  Filled 2024-01-06: qty 1

## 2024-01-06 MED ORDER — SERTRALINE HCL 50 MG PO TABS
100.0000 mg | ORAL_TABLET | Freq: Every day | ORAL | Status: DC
  Administered 2024-01-06: 100 mg via ORAL
  Filled 2024-01-06: qty 2

## 2024-01-06 MED ORDER — ENSURE PLUS HIGH PROTEIN PO LIQD: 237.0000 mL | Freq: Two times a day (BID) | ORAL | Status: DC

## 2024-01-06 MED ORDER — RIVASTIGMINE TARTRATE 3 MG PO CAPS
3.0000 mg | ORAL_CAPSULE | Freq: Two times a day (BID) | ORAL | Status: DC
  Administered 2024-01-06 – 2024-01-07 (×2): 3 mg via ORAL
  Filled 2024-01-06 (×2): qty 1

## 2024-01-06 MED ORDER — ZONISAMIDE 100 MG PO CAPS
100.0000 mg | ORAL_CAPSULE | Freq: Two times a day (BID) | ORAL | Status: DC
  Administered 2024-01-07: 100 mg via ORAL
  Filled 2024-01-06 (×2): qty 1

## 2024-01-06 MED ORDER — AMLODIPINE BESYLATE 5 MG PO TABS
5.0000 mg | ORAL_TABLET | Freq: Every day | ORAL | Status: DC
  Administered 2024-01-07: 5 mg via ORAL
  Filled 2024-01-06 (×2): qty 1

## 2024-01-06 MED ORDER — HALOPERIDOL LACTATE 5 MG/ML IJ SOLN
5.0000 mg | Freq: Four times a day (QID) | INTRAMUSCULAR | Status: DC | PRN
  Administered 2024-01-06: 5 mg via INTRAMUSCULAR
  Filled 2024-01-06: qty 1

## 2024-01-06 MED ORDER — QUETIAPINE FUMARATE 25 MG PO TABS
100.0000 mg | ORAL_TABLET | Freq: Every morning | ORAL | Status: DC
  Administered 2024-01-07: 100 mg via ORAL
  Filled 2024-01-06: qty 4

## 2024-01-06 MED ORDER — MEMANTINE HCL 5 MG PO TABS
10.0000 mg | ORAL_TABLET | Freq: Two times a day (BID) | ORAL | Status: DC
  Administered 2024-01-06 – 2024-01-07 (×2): 10 mg via ORAL
  Filled 2024-01-06 (×2): qty 2

## 2024-01-06 MED ORDER — TRAZODONE HCL 50 MG PO TABS
50.0000 mg | ORAL_TABLET | Freq: Every day | ORAL | Status: DC
  Administered 2024-01-06: 50 mg via ORAL
  Filled 2024-01-06: qty 1

## 2024-01-06 MED ORDER — LORAZEPAM 2 MG/ML IJ SOLN
2.0000 mg | Freq: Once | INTRAMUSCULAR | Status: AC
  Administered 2024-01-06: 2 mg via INTRAMUSCULAR
  Filled 2024-01-06: qty 1

## 2024-01-06 MED ORDER — DIVALPROEX SODIUM 250 MG PO DR TAB
250.0000 mg | DELAYED_RELEASE_TABLET | Freq: Two times a day (BID) | ORAL | Status: DC
  Administered 2024-01-06 – 2024-01-07 (×2): 250 mg via ORAL
  Filled 2024-01-06 (×2): qty 1

## 2024-01-06 MED ORDER — POLYETHYLENE GLYCOL 3350 17 G PO PACK: 17.0000 g | PACK | Freq: Every day | ORAL | Status: DC | PRN

## 2024-01-06 MED ORDER — HALOPERIDOL 5 MG PO TABS: 5.0000 mg | ORAL_TABLET | Freq: Four times a day (QID) | ORAL | Status: DC | PRN
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
## 2024-01-06 NOTE — Transitions of Care (Post Inpatient/ED Visit) (Signed)
   01/06/2024  Name: Anne [name] MRN: [id number] DOB: [birth year]  Today's TOC FU Call Status: Today's TOC FU Call Status:: Unsuccessful Call (1st Attempt) Unsuccessful Call (1st Attempt) Date: 01/06/24  Attempted to reach the patient regarding the most recent Inpatient/ED visit.  Follow Up Plan: Additional outreach attempts will be made to reach the patient to complete the Transitions of Care (Post Inpatient/ED visit) call.   Arvin Seip RN, BSN, CCM [employer], Population Health Case Manager Phone: [phone]

## 2024-01-06 NOTE — ED Notes (Signed)
 Pt discharged at this time. Pt discharged to Husband who is transporting pt home. Discharge instructions reviewed with pt husband at this time. Vital signs taken. RR even and unlabored. Pt and husband denies any questions or needs at this time. Rn assisted pt into vehicle.

## 2024-01-06 NOTE — ED Notes (Signed)
 Meal tray provided. Tray checked for potential hazards. Pt denies no other needs at this time.

## 2024-01-06 NOTE — ED Notes (Signed)
 This RN called pt's husband to let him know pt was up for discharge. No answer. RN left HIPAA appropriate voicemail.

## 2024-01-06 NOTE — ED Triage Notes (Signed)
 Pt brought in via ems from home.  Pt tripped over the doorway frame and fell face forward.  No loc.  Pt just released from [facility] today.  Pt has abrasion to right elbow.  Pt has bruise on chin.   Pt in c-collar on arrival    pt on stretcher in triage.

## 2024-01-06 NOTE — Telephone Encounter (Signed)
 Called and spoke to [place] and she stated she actually Marked pt off of her list due to her being back in the hospital. She stated they may be looking for long term care

## 2024-01-06 NOTE — ED Notes (Addendum)
 Upon arrival to this assignment. Day RN notified this RN that pt needed IM medications for disruptive behavior and risk of harm to self and staff. RN notified EDP and prn medications were ordered by provider and given by RN.   EDT remains sitting with pt for their safety at this time until medications can calm pt down.

## 2024-01-06 NOTE — ED Provider Notes (Signed)
 [facility] Provider Note    Event Date/Time   First MD Initiated Contact with Patient 01/06/24 1851     (approximate)   History   Chief Complaint: Fall   HPI  Anne [name] is a 73 y.o. female with a history of lung cancer, cirrhosis, frequent falls, dementia who is brought to the ED due to trip and fall at home, hitting her face.  Denies loss of consciousness or pain.  No other acute complaints.  Was just discharged from the emergency department this morning.        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), in remission, 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   Metastatic bone cancer    Seizures (HCC)    Subdural hematoma [facility])     Current Outpatient Rx   Order #: [id number] Class: Normal   Order #: [id number] Class: Normal   Order #: [id number] Class: Normal   Order #: [id number] Class: Normal   Order #: [id number] Class: Normal   Order #: [id number] Class: Normal   Order #: [id number] Class: Normal   Order #: [id number] Class: Normal   Order #: [id number] Class: Normal   Order #: [id number] Class: Normal   Order #: [id number] Class: Normal   Order #: [id number] Class: Normal   Order #: [id number] Class: Normal   Order #: [id number] Class: Normal   Order #: [id number] Class: Normal   Order #: [id number] Class: Normal    Past Surgical History:  Procedure Laterality Date   APPENDECTOMY  1971   CHOLECYSTECTOMY  1971   COLONOSCOPY WITH PROPOFOL  N/A 11/15/2016   Procedure: COLONOSCOPY WITH PROPOFOL ;  Surgeon: Therisa Bi, MD;  Location: [facility] ENDOSCOPY;  Service: Gastroenterology;  Laterality: N/A;   ENDOBRONCHIAL ULTRASOUND Right 07/30/2018   Procedure: ENDOBRONCHIAL ULTRASOUND;  Surgeon: Verdia Art, MD;  Location: [facility] ORS;  Service: Pulmonary;  Laterality: Right;    ESOPHAGOGASTRODUODENOSCOPY (EGD) WITH PROPOFOL  N/A 01/07/2017   Procedure: ESOPHAGOGASTRODUODENOSCOPY (EGD) WITH PROPOFOL ;  Surgeon: Therisa Bi, MD;  Location: [facility] ENDOSCOPY;  Service: Gastroenterology;  Laterality: N/A;   LAPAROSCOPY N/A 03/01/2017   Procedure: LAPAROSCOPY DIAGNOSTIC;  Surgeon: Dessa Reyes ORN, MD;  Location: [facility] ORS;  Service: General;  Laterality: N/A;   PORTA CATH INSERTION N/A 08/14/2018   Procedure: PORTA CATH INSERTION;  Surgeon: Marea Selinda RAMAN, MD;  Location: [facility] INVASIVE CV LAB;  Service: Cardiovascular;  Laterality: N/A;   TONSILECTOMY, ADENOIDECTOMY, BILATERAL MYRINGOTOMY AND TUBES  1955   TONSILLECTOMY     VENTRAL HERNIA REPAIR N/A 03/01/2017   10 x 14 CM Ventralight ST mesh, intraperitoneal location.    VENTRAL HERNIA REPAIR N/A 03/01/2017   Procedure: HERNIA REPAIR VENTRAL ADULT;  Surgeon: Dessa Reyes ORN, MD;  Location: [facility] ORS;  Service: General;  Laterality: N/A;    Physical Exam   Triage Vital Signs: ED Triage Vitals  Encounter Vitals Group     BP 01/06/24 1826 115/61     Girls Systolic BP Percentile --      Girls Diastolic BP Percentile --      Boys Systolic BP Percentile --      Boys Diastolic BP Percentile --      Pulse Rate 01/06/24 1826 72     Resp 01/06/24 1826 18     Temp 01/06/24 1826 98.3 F (36.8 C)  Temp Source 01/06/24 1826 Oral     SpO2 01/06/24 1826 97 %     Weight 01/06/24 1823 154 lb 5.2 oz (70 kg)     Height 01/06/24 1823 5' 7 (1.702 m)     Head Circumference --      Peak Flow --      Pain Score 01/06/24 1823 5     Pain Loc --      Pain Education --      Exclude from Growth Chart --     Most recent vital signs: Vitals:   01/06/24 1826 01/06/24 2018  BP: 115/61 115/61  Pulse: 72   Resp: 18   Temp: 98.3 F (36.8 C)   SpO2: 97%     General: Awake, no distress.  CV:  Good peripheral perfusion.  Regular rate rhythm Resp:  Normal effort.  Abd:  No distention.  Other:  No significant head trauma.  No midline  spinal tenderness.  Full range of motion.  Ambulatory.  Forage of motion all extremities.  Small abrasion over the right elbow, no laceration.   ED Results / Procedures / Treatments   Labs (all labs ordered are listed, but only abnormal results are displayed) Labs Reviewed - No data to display   EKG    RADIOLOGY CT head unremarkable   PROCEDURES:  Procedures   MEDICATIONS ORDERED IN ED: Medications  amLODipine  (NORVASC ) tablet 5 mg (5 mg Oral Not Given 01/06/24 2018)  divalproex  (DEPAKOTE ) DR tablet 250 mg (250 mg Oral Given 01/06/24 2107)  feeding supplement (ENSURE PLUS HIGH PROTEIN) liquid 237 mL (has no administration in time range)  levothyroxine  (SYNTHROID ) tablet 112 mcg (has no administration in time range)  memantine  (NAMENDA ) tablet 10 mg (10 mg Oral Given 01/06/24 2107)  polyethylene glycol (MIRALAX  / GLYCOLAX ) packet 17 g (has no administration in time range)  QUEtiapine  (SEROQUEL ) tablet 100 mg (has no administration in time range)  rivastigmine  (EXELON ) capsule 3 mg (3 mg Oral Given 01/06/24 2106)  rosuvastatin  (CRESTOR ) tablet 20 mg (has no administration in time range)  sertraline  (ZOLOFT ) tablet 100 mg (100 mg Oral Given 01/06/24 2106)  traZODone  (DESYREL ) tablet 50 mg (50 mg Oral Given 01/06/24 2106)  zonisamide  (ZONEGRAN ) capsule 100 mg (0 mg Oral Hold 01/06/24 2116)  haloperidol  (HALDOL ) tablet 5 mg ( Oral See Alternative 01/06/24 1914)    Or  haloperidol  lactate (HALDOL ) injection 5 mg (5 mg Intramuscular Given 01/06/24 1914)  LORazepam  (ATIVAN ) injection 2 mg (2 mg Intramuscular Given 01/06/24 1955)     IMPRESSION / MDM / ASSESSMENT AND PLAN / ED COURSE  I reviewed the triage vital signs and the nursing notes.  Patient's presentation is most consistent with exacerbation of chronic illness.  Patient is brought back to the ED due to mechanical fall at home shortly after returning.  She is ambulatory, nontoxic, denies complaints, normal vitals, reassuring  exam.  CT head negative.  Will reengage with TOC.   Clinical Course as of 01/06/24 2318  Mon Jan 06, 2024  2205 Medically clear. Will reconsult TOC [PS]    Clinical Course User Index [PS] Viviann Pastor, MD     FINAL CLINICAL IMPRESSION(S) / ED DIAGNOSES   Final diagnoses:  Chronic dementia (HCC)  Fall in home, initial encounter     Rx / DC Orders   ED Discharge Orders     None        Note:  This document was prepared using Dragon voice recognition software and may  include unintentional dictation errors.   Viviann Pastor, MD 01/06/24 [phone]

## 2024-01-06 NOTE — ED Notes (Signed)
 Pt ambulated to bathroom. Rn found pt in bathroom, RN assisted pt with restroom. Pt given new brief. Pt ambulated to stretcher.

## 2024-01-06 NOTE — ED Notes (Signed)
 Pt up to restroom. Pt unsteady on her feet but assisted by this RN and EDT, Alexis. Pt back to bed. Bed alarm turned on. Pt back to sleep.

## 2024-01-06 NOTE — ED Notes (Signed)
 Pt at door yelling and screaming at staff. Pt cussing at this RN and EDT.

## 2024-01-06 NOTE — ED Notes (Signed)
 Pt removed c-collar in triage, yelling out and trying to get up and walk.  Security at bedside.  Pt placed in wheelchair.

## 2024-01-06 NOTE — ED Notes (Signed)
 Pt ABCs intact. RR even and unlabored. Pt in NAD. Bed in lowest locked position. Call bell in reach. Pt resting.

## 2024-01-06 NOTE — ED Notes (Signed)
 RN attempted to call spouse at this time.

## 2024-01-06 NOTE — ED Notes (Signed)
 Spouse contacted and states he is on the way to pick up pt.

## 2024-01-06 NOTE — ED Notes (Signed)
Fall bundle in place

## 2024-01-06 NOTE — ED Notes (Signed)
 This RN tried calling pt's husband to let him know she is ready for discharge. No answer.
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
## 2024-01-06 NOTE — Telephone Encounter (Signed)
 Copied from CRM [id number]. Topic: General - Other >> Jan 06, 2024 10:11 AM Zebedee SAUNDERS wrote: Reason for CRM: Received call from [facility] per [place] ph: [phone] requesting assistant living for pt.

## 2024-01-07 ENCOUNTER — Encounter: Payer: Self-pay | Admitting: Oncology

## 2024-01-07 ENCOUNTER — Other Ambulatory Visit: Payer: Self-pay

## 2024-01-07 ENCOUNTER — Telehealth: Payer: Self-pay

## 2024-01-07 DIAGNOSIS — R4182 Altered mental status, unspecified: Secondary | ICD-10-CM | POA: Diagnosis not present

## 2024-01-07 NOTE — Transitions of Care (Post Inpatient/ED Visit) (Signed)
   01/07/2024  Name: Anne [name] MRN: [id number] DOB: 1951-01-13  Today's TOC FU Call Status: Today's TOC FU Call Status:: Unsuccessful Call (2nd Attempt) Unsuccessful Call (2nd Attempt) Date: 01/07/24  Attempted to reach the patient regarding the most recent Inpatient/ED visit.  Follow Up Plan: Additional outreach attempts will be made to reach the patient to complete the Transitions of Care (Post Inpatient/ED visit) call.   Arvin Seip RN, BSN, CCM [employer], Population Health Case Manager Phone: [phone]

## 2024-01-07 NOTE — ED Notes (Signed)
 This NT responded to pt's bed alarm. Pt was sitting at the edge of the bed, and stated I need to pee. This NT ambulated with pt to and from bathroom, bed alarm back in place and vitals obtained. Pt is calm and cooperative.

## 2024-01-07 NOTE — ED Notes (Signed)
TOC Boarder Status

## 2024-01-07 NOTE — ED Notes (Signed)
 Husband called. Husband states he will come pick up pt soon.

## 2024-01-07 NOTE — Discharge Instructions (Addendum)
 CT imaging was reassuring patient was ambulatory and therefore will be able to be discharged back home

## 2024-01-07 NOTE — ED Notes (Signed)
 Pt discharged home with husband at this time. Pt calm and cooperative. Discharge instructions reviewed with husband. Pt wheeled out to car by RN and assisted in vehicle. All belongings given to husband. Pt and husband denies any needs at this time.

## 2024-01-07 NOTE — ED Provider Notes (Signed)
 73 year old female who presents with concern of a fall.  Unfortunately recent prolonged stay in our emergency department for similar.  Awaiting evaluation with social work to help determine safe disposition.  No acute events during my shift.   [name], MD 01/07/24 [phone]

## 2024-01-07 NOTE — ED Provider Notes (Signed)
 Case discussed with almarie spare, nathanael creech, dunn--  Per discussion with them, In the most recent long ED stay, spouse was clear that he is not going to pay for pt to go to facility. DSS was involved and was working from [employer] with them on support. She should already have home health and equipment. Pt was seen  on 11/29 and set her up with [facility]- notified DSS and did a referral to congregational nursing and therefore there were no additional interventions needed by SW today.   I did discuss with the patient's nurse and patient has been up and ambulatory.  We will attempt to try to get the husband to come pick patient up as that is typically how she is transported back home.   Ernest Ronal BRAVO, MD 01/07/24 [phone]

## 2024-01-08 ENCOUNTER — Other Ambulatory Visit: Payer: Self-pay

## 2024-01-09 ENCOUNTER — Ambulatory Visit: Admitting: Family

## 2024-01-09 ENCOUNTER — Encounter: Payer: Self-pay | Admitting: Family

## 2024-01-09 ENCOUNTER — Other Ambulatory Visit: Payer: Self-pay

## 2024-01-09 VITALS — BP 130/80 | HR 80 | Temp 97.7°F | Ht 62.0 in | Wt 156.0 lb

## 2024-01-09 DIAGNOSIS — F03918 Unspecified dementia, unspecified severity, with other behavioral disturbance: Secondary | ICD-10-CM

## 2024-01-09 DIAGNOSIS — I10 Essential (primary) hypertension: Secondary | ICD-10-CM

## 2024-01-09 LAB — CBC WITH DIFFERENTIAL/PLATELET
Basophils Absolute: 0 K/uL (ref 0.0–0.1)
Basophils Relative: 0.6 % (ref 0.0–3.0)
Eosinophils Absolute: 0.1 K/uL (ref 0.0–0.7)
Eosinophils Relative: 2.7 % (ref 0.0–5.0)
HCT: 39 % (ref 36.0–46.0)
Hemoglobin: 12.6 g/dL (ref 12.0–15.0)
Lymphocytes Relative: 20.6 % (ref 12.0–46.0)
Lymphs Abs: 0.9 K/uL (ref 0.7–4.0)
MCHC: 32.3 g/dL (ref 30.0–36.0)
MCV: 89.7 fl (ref 78.0–100.0)
Monocytes Absolute: 0.2 K/uL (ref 0.1–1.0)
Monocytes Relative: 5.7 % (ref 3.0–12.0)
Neutro Abs: 3.1 K/uL (ref 1.4–7.7)
Neutrophils Relative %: 70.4 % (ref 43.0–77.0)
Platelets: 122 K/uL — ABNORMAL LOW (ref 150.0–400.0)
RBC: 4.35 Mil/uL (ref 3.87–5.11)
RDW: 15.8 % — ABNORMAL HIGH (ref 11.5–15.5)
WBC: 4.3 K/uL (ref 4.0–10.5)

## 2024-01-09 LAB — COMPREHENSIVE METABOLIC PANEL WITH GFR
ALT: 20 U/L (ref 0–35)
AST: 27 U/L (ref 0–37)
Albumin: 3.8 g/dL (ref 3.5–5.2)
Alkaline Phosphatase: 66 U/L (ref 39–117)
BUN: 18 mg/dL (ref 6–23)
CO2: 31 meq/L (ref 19–32)
Calcium: 9.3 mg/dL (ref 8.4–10.5)
Chloride: 103 meq/L (ref 96–112)
Creatinine, Ser: 0.84 mg/dL (ref 0.40–1.20)
GFR: 67.79 mL/min (ref >60.00)
Glucose, Bld: 88 mg/dL (ref 70–99)
Potassium: 4 meq/L (ref 3.5–5.1)
Sodium: 141 meq/L (ref 135–145)
Total Bilirubin: 0.3 mg/dL (ref 0.2–1.2)
Total Protein: 7 g/dL (ref 6.0–8.3)

## 2024-01-09 NOTE — Patient Instructions (Addendum)
 Please keep upcoming phone call with [employer] social worker as we discussed at length.  You will also need follow up with oncology due to history of lung cancer  Please call Dr Melanee oncology [facility] [address] [place], [name] [address] [phone]
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
## 2024-01-10 ENCOUNTER — Ambulatory Visit: Payer: Self-pay | Admitting: Family

## 2024-01-13 ENCOUNTER — Encounter: Payer: Self-pay | Admitting: *Deleted

## 2024-01-13 ENCOUNTER — Telehealth: Payer: Self-pay | Admitting: *Deleted

## 2024-01-13 NOTE — Patient Outreach (Signed)
 Phone call to patient's daughter Butler Bells on [place]. Patient's daughter confirmed that patient's assigned Adult Protective Services case manager is Leonor Ply. Patient's daughter Jon Ramus is currently in the process of applying for guardianship for patient.  CSW will continue outreach attempts to patient's spouse.   Kateri Balch, LCSW [place]  [facility], [facility] Licensed Clinical Social Worker  Direct Dial: [phone]

## 2024-01-14 NOTE — Assessment & Plan Note (Signed)
 Reviewed hospitalization, medications reconciled  she experiences significant behavioral issues, including sundowning and agitation, and requires a guardian for legal and financial management. Assisted living placement is necessary due to safety concerns.High risk for falls.   Referred to social work for guardian assistance and placement. Continue Seroquel  100 mg and Depakote  as prescribed by neurology. Emphasized the importance of a phone call with social worker [employer] on December 8th and reiterated to caregiver/husband today the importance of this phone call to move forward with patient safety.  History of lung cancer .emphasized importance of follow-up with Dr. Melanee; provided phone number to husband today.  Sa02  96% without supplemental oxygen today.

## 2024-01-16 ENCOUNTER — Telehealth: Payer: Self-pay

## 2024-01-16 NOTE — Progress Notes (Unsigned)
 Complex Care Management Care Guide Note  01/16/2024 Name: Anne [name] MRN: [id number] DOB: Jan 31, 1951  Anne [name] is a 73 y.o. year old female who is a primary care patient of Gretel App, NP and is actively engaged with the care management team. I reached out to Anne [name] by phone today to assist with re-scheduling  with the Licensed Clinical Child Psychotherapist.  Follow up plan: Unsuccessful telephone outreach attempt made. A HIPAA compliant phone message was left for the patient providing contact information and requesting a return call.  [name] [facility] Guide  Direct Dial: [phone]  Fax [phone]

## 2024-01-17 NOTE — Progress Notes (Signed)
 Complex Care Management Care Guide Note  01/17/2024 Name: TAMMRA PRESSMAN MRN: [id number] DOB: 1950-12-10  MILIA WARTH is a 73 y.o. year old female who is a primary care patient of Gretel App, NP and is actively engaged with the care management team. I reached out to Korrie E Mcreynolds by phone today to assist with re-scheduling  with the Licensed Clinical Child Psychotherapist.  Follow up plan: Unsuccessful telephone outreach attempt made. A HIPAA compliant phone message was left for the patient providing contact information and requesting a return call.  [name] [facility], [facility] Guide  Direct Dial: [phone]  Fax [phone]

## 2024-01-27 ENCOUNTER — Telehealth: Payer: Self-pay

## 2024-01-27 NOTE — Progress Notes (Signed)
 Complex Care Management Care Guide Note  01/27/2024 Name: Anne [name] MRN: [id number] DOB: [birth year]  Anne [name] is a 73 y.o. year old female who is a primary care patient of Gretel App, NP and is actively engaged with the care management team. I reached out to Mischelle E Yaffe by phone today to assist with re-scheduling  with the Licensed Clinical Child Psychotherapist.  Follow up plan: Unsuccessful telephone outreach attempt made. A HIPAA compliant phone message was left for the patient providing contact information and requesting a return call.  [name] [facility], [facility] Guide  Direct Dial: [phone]  Fax [phone]

## 2024-01-29 NOTE — Progress Notes (Signed)
 Complex Care Management Care Guide Note  01/29/2024 Name: Anne [name] MRN: [id number] DOB: February 23, 1950  Anne [name] is a 73 y.o. year old female who is a primary care patient of Gretel App, NP and is actively engaged with the care management team. I reached out to Kenetra E Murakami by phone today to assist with re-scheduling  with the Licensed Clinical Child Psychotherapist.  Follow up plan: Unsuccessful telephone outreach attempt made. A HIPAA compliant phone message was left for the patient providing contact information and requesting a return call.  [name] [facility], [facility] Guide  Direct Dial: [phone]  Fax [phone]

## 2024-02-04 ENCOUNTER — Encounter: Payer: Self-pay | Admitting: Oncology

## 2024-02-04 ENCOUNTER — Other Ambulatory Visit: Payer: Self-pay | Admitting: Nurse Practitioner

## 2024-02-04 ENCOUNTER — Other Ambulatory Visit: Payer: Self-pay

## 2024-02-04 DIAGNOSIS — I10 Essential (primary) hypertension: Secondary | ICD-10-CM

## 2024-02-05 ENCOUNTER — Other Ambulatory Visit: Payer: Self-pay

## 2024-02-05 ENCOUNTER — Other Ambulatory Visit (HOSPITAL_COMMUNITY): Payer: Self-pay

## 2024-02-05 MED ORDER — AMLODIPINE BESYLATE 5 MG PO TABS
5.0000 mg | ORAL_TABLET | Freq: Every day | ORAL | 3 refills | Status: AC
  Filled 2024-02-05: qty 30, 30d supply, fill #0
  Filled 2024-03-08: qty 30, 30d supply, fill #1

## 2024-02-07 ENCOUNTER — Other Ambulatory Visit: Payer: Self-pay

## 2024-02-20 ENCOUNTER — Other Ambulatory Visit: Payer: Self-pay

## 2024-02-25 ENCOUNTER — Ambulatory Visit: Admitting: Nurse Practitioner

## 2024-02-27 ENCOUNTER — Other Ambulatory Visit: Payer: Self-pay

## 2024-02-27 DIAGNOSIS — C3411 Malignant neoplasm of upper lobe, right bronchus or lung: Secondary | ICD-10-CM

## 2024-02-28 ENCOUNTER — Inpatient Hospital Stay: Attending: Oncology

## 2024-02-28 ENCOUNTER — Encounter: Payer: Self-pay | Admitting: Oncology

## 2024-02-28 ENCOUNTER — Inpatient Hospital Stay: Admitting: Oncology

## 2024-02-28 DIAGNOSIS — Z79890 Hormone replacement therapy (postmenopausal): Secondary | ICD-10-CM | POA: Diagnosis not present

## 2024-02-28 DIAGNOSIS — F0390 Unspecified dementia without behavioral disturbance: Secondary | ICD-10-CM

## 2024-02-28 DIAGNOSIS — Z08 Encounter for follow-up examination after completed treatment for malignant neoplasm: Secondary | ICD-10-CM | POA: Insufficient documentation

## 2024-02-28 DIAGNOSIS — Z79899 Other long term (current) drug therapy: Secondary | ICD-10-CM | POA: Diagnosis not present

## 2024-02-28 DIAGNOSIS — E063 Autoimmune thyroiditis: Secondary | ICD-10-CM

## 2024-02-28 DIAGNOSIS — C3411 Malignant neoplasm of upper lobe, right bronchus or lung: Secondary | ICD-10-CM

## 2024-02-28 DIAGNOSIS — Z85118 Personal history of other malignant neoplasm of bronchus and lung: Secondary | ICD-10-CM

## 2024-02-28 DIAGNOSIS — Z87891 Personal history of nicotine dependence: Secondary | ICD-10-CM | POA: Diagnosis not present

## 2024-02-28 DIAGNOSIS — Z923 Personal history of irradiation: Secondary | ICD-10-CM | POA: Diagnosis not present

## 2024-02-28 DIAGNOSIS — Z8041 Family history of malignant neoplasm of ovary: Secondary | ICD-10-CM | POA: Diagnosis not present

## 2024-02-28 DIAGNOSIS — Z9221 Personal history of antineoplastic chemotherapy: Secondary | ICD-10-CM | POA: Insufficient documentation

## 2024-02-28 LAB — CBC WITH DIFFERENTIAL (CANCER CENTER ONLY)
Abs Immature Granulocytes: 0.01 K/uL (ref 0.00–0.07)
Basophils Absolute: 0 K/uL (ref 0.0–0.1)
Basophils Relative: 1 %
Eosinophils Absolute: 0.1 K/uL (ref 0.0–0.5)
Eosinophils Relative: 3 %
HCT: 44.2 % (ref 36.0–46.0)
Hemoglobin: 13.6 g/dL (ref 12.0–15.0)
Immature Granulocytes: 0 %
Lymphocytes Relative: 31 %
Lymphs Abs: 1.1 K/uL (ref 0.7–4.0)
MCH: 29.1 pg (ref 26.0–34.0)
MCHC: 30.8 g/dL (ref 30.0–36.0)
MCV: 94.4 fL (ref 80.0–100.0)
Monocytes Absolute: 0.3 K/uL (ref 0.1–1.0)
Monocytes Relative: 7 %
Neutro Abs: 2.2 K/uL (ref 1.7–7.7)
Neutrophils Relative %: 58 %
Platelet Count: 117 K/uL — ABNORMAL LOW (ref 150–400)
RBC: 4.68 MIL/uL (ref 3.87–5.11)
RDW: 14.5 % (ref 11.5–15.5)
WBC Count: 3.7 K/uL — ABNORMAL LOW (ref 4.0–10.5)
nRBC: 0 % (ref 0.0–0.2)

## 2024-02-28 LAB — CMP (CANCER CENTER ONLY)
ALT: 15 U/L (ref 0–44)
AST: 22 U/L (ref 15–41)
Albumin: 4.1 g/dL (ref 3.5–5.0)
Alkaline Phosphatase: 74 U/L (ref 38–126)
Anion gap: 9 (ref 5–15)
BUN: 23 mg/dL (ref 8–23)
CO2: 28 mmol/L (ref 22–32)
Calcium: 9.6 mg/dL (ref 8.9–10.3)
Chloride: 106 mmol/L (ref 98–111)
Creatinine: 0.79 mg/dL (ref 0.44–1.00)
GFR, Estimated: >60 mL/min
Glucose, Bld: 90 mg/dL (ref 70–99)
Potassium: 4 mmol/L (ref 3.5–5.1)
Sodium: 143 mmol/L (ref 135–145)
Total Bilirubin: 0.3 mg/dL (ref 0.0–1.2)
Total Protein: 6.9 g/dL (ref 6.5–8.1)

## 2024-02-28 NOTE — Progress Notes (Signed)
 Patient has no new or acute concerns at this time.

## 2024-02-28 NOTE — Progress Notes (Signed)
 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  She has been referred to us  for findings of bone metastases and her recent MRI.  She has a prior history of 3 packs/day day smoking for over 45 years and quit smoking 5 years ago.She had a CT chest abdomen pelvis in 2018 which showed a 5 mm lung nodule in the left lower lobe.  Recently over the last 2 months patient has been having worsening back pain and was referred to orthopedics.  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  Pathologic fracture of S1.   PET scan showed 2 RUL lung nodules, hilar and mediastinal adenopathy and widespread bone mets. MRI brain negative.   Patient completed palliative RT to her spine. Bronchoscopy showed small cell lung cancer.  Palliative carboplatin , etoposide  and Tecentriq  started on 08/18/2018. Scans after 4 cycles showed stable disease.  Maintenance Tecentriq  was stopped in April 2024 due to stable disease and progressive dementia.   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  She reports being compliant but her values fluctuate widely.    Interval history- Anne [name] is a 74 year old female with lung cancer presenting for oncology follow-up to assess current cancer status.  She has a diagnosis of lung cancer and has not received recent oncology-directed therapy. The most recent imaging was a chest CT performed in November 2025 during an emergency room visit; no additional recent imaging, including bone scan, has been completed. She has experienced multiple hospitalizations over the past month. She is eating adequately.  She has significant cognitive impairment characterized by profound memory loss and episodes of agitation, including an incident at a care facility where she cracked a glass door with her walker. She frequently does not recognize her home or her husband and expresses confusion regarding her location. Placement in a care facility has been challenging due to financial and legal constraints, and she currently resides at home with her husband as primary caregiver. She is a client of [facility] and is awaiting a guardianship hearing.  Her husband confirmed adherence to thyroid  medication and her ability to attend most medical appointments.     ECOG PS- 2 Pain scale- 0   Review of systems- Review of Systems  Unable to perform ROS: Dementia  Patient does not verbalize much but tells me that overall she is comfortable and does not report any pain   Allergies[1]   Past Medical History:  Diagnosis Date   Arthritis    Cirrhosis (HCC)    Dementia (HCC)    Depression    GERD (gastroesophageal reflux disease)  Hyperlipidemia    Hypertension    Lung cancer (HCC)    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), in remission, 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   Metastatic bone cancer    Seizures (HCC)    Subdural hematoma (HCC)      Past Surgical History:  Procedure  Laterality Date   APPENDECTOMY  1971   CHOLECYSTECTOMY  1971   COLONOSCOPY WITH PROPOFOL  N/A 11/15/2016   Procedure: COLONOSCOPY WITH PROPOFOL ;  Surgeon: Therisa Bi, MD;  Location: [facility] ENDOSCOPY;  Service: Gastroenterology;  Laterality: N/A;   ENDOBRONCHIAL ULTRASOUND Right 07/30/2018   Procedure: ENDOBRONCHIAL ULTRASOUND;  Surgeon: Verdia Art, MD;  Location: [facility] ORS;  Service: Pulmonary;  Laterality: Right;   ESOPHAGOGASTRODUODENOSCOPY (EGD) WITH PROPOFOL  N/A 01/07/2017   Procedure: ESOPHAGOGASTRODUODENOSCOPY (EGD) WITH PROPOFOL ;  Surgeon: Therisa Bi, MD;  Location: [facility] ENDOSCOPY;  Service: Gastroenterology;  Laterality: N/A;   LAPAROSCOPY N/A 03/01/2017   Procedure: LAPAROSCOPY DIAGNOSTIC;  Surgeon: Anne Reyes ORN, MD;  Location: [facility] ORS;  Service: General;  Laterality: N/A;   PORTA CATH INSERTION N/A 08/14/2018   Procedure: PORTA CATH INSERTION;  Surgeon: Marea Selinda RAMAN, MD;  Location: [facility] INVASIVE CV LAB;  Service: Cardiovascular;  Laterality: N/A;   TONSILECTOMY, ADENOIDECTOMY, BILATERAL MYRINGOTOMY AND TUBES  1955   TONSILLECTOMY     VENTRAL HERNIA REPAIR N/A 03/01/2017   10 x 14 CM Ventralight ST mesh, intraperitoneal location.    VENTRAL HERNIA REPAIR N/A 03/01/2017   Procedure: HERNIA REPAIR VENTRAL ADULT;  Surgeon: Anne Reyes ORN, MD;  Location: [facility] ORS;  Service: General;  Laterality: N/A;    Social History   Socioeconomic History   Marital status: Divorced    Spouse name: Not on file   Number of children: 2   Years of education: Not on file   Highest education level: High school graduate  Occupational History   Not on file  Tobacco Use   Smoking status: Former    Current packs/day: 0.00    Average packs/day: 2.0 packs/day for 50.0 years (100.0 ttl pk-yrs)    Types: Cigarettes, E-cigarettes    Start date: 02/06/1962    Quit date: 02/07/2012    Years since quitting: 12.0   Smokeless tobacco: Never  Vaping Use   Vaping status: Former   Start date:  10/06/2013   Devices: uses no liquid  Substance and Sexual Activity   Alcohol use: No    Alcohol/week: 0.0 standard drinks of alcohol   Drug use: No   Sexual activity: Not Currently  Other Topics Concern   Not on file  Social History Narrative   Married   Retired   Engineer, [employer] level of education    No children    1 cup of coffee   Social Drivers of Health   Tobacco Use: Medium Risk (02/28/2024)   Patient History    Smoking Tobacco Use: Former    Smokeless Tobacco Use: Never    Passive Exposure: Not on file  Financial Resource Strain: High Risk (06/23/2023)   Received from [facility]   Overall Financial Resource Strain (CARDIA)    Difficulty of Paying Living Expenses: Hard  Food Insecurity: No Food Insecurity (12/30/2023)   Epic    Worried About Radiation Protection Practitioner of Food in the Last Year: Never true    Ran Out of Food in the Last Year: Never true  Transportation Needs: No Transportation Needs (12/30/2023)   Epic    Lack of Transportation (Medical): No    Lack of Transportation (Non-Medical): No  Physical Activity: Inactive (02/22/2023)   Exercise Vital Sign    Days of Exercise per Week: 0 days    Minutes of Exercise per Session: 0 min  Stress: Patient Unable To Answer (02/22/2023)   [employer] of Occupational Health - Occupational Stress Questionnaire    Feeling of Stress : Patient unable to answer  Social Connections: Patient Unable To Answer (12/30/2023)   Social Connection and Isolation Panel    Frequency of Communication with Friends and Family: Patient unable to answer    Frequency of Social Gatherings with Friends and Family: Patient unable to answer    Attends Religious Services: Patient unable to answer    Active Member of Clubs or Organizations: Patient unable to answer    Attends Banker Meetings: Patient unable to answer    Marital Status: Patient unable to answer  Intimate Partner Violence: Unknown (12/30/2023)   Epic    Fear of  Current or Ex-Partner: Patient unable to answer    Emotionally Abused: Not on file    Physically Abused: Patient unable to answer    Sexually Abused: Patient unable to answer  Depression (PHQ2-9): Low Risk (02/28/2024)   Depression (PHQ2-9)    PHQ-2 Score: 0  Alcohol Screen: Low Risk (02/22/2023)   Alcohol Screen    Last Alcohol Screening Score (AUDIT): 0  Housing: Low Risk (12/30/2023)   Epic    Unable to Pay for Housing in the Last Year: No    Number of Times Moved in the Last Year: 0    Homeless in the Last Year: No  Utilities: Not At Risk (12/30/2023)   Epic    Threatened with loss of utilities: No  Health Literacy: Patient Unable To Answer (02/22/2023)   B1300 Health Literacy    Frequency of need for help with medical instructions: Patient unable to respond    Family History  Problem Relation Age of Onset   Hypertension Mother    Ovarian cancer Mother 52   Dementia Mother    Heart disease Father    Stroke Father    Seizures Father    Ovarian cancer Sister        ? dx cancer had hyst.   Breast cancer Neg Hx     Current Medications[2]  Physical exam:  Vitals:   02/28/24 1130  BP: 127/70  Pulse: 66  Temp: (!) 97.4 F (36.3 C)  TempSrc: Tympanic  SpO2: 98%  Weight: 157 lb 9.6 oz (71.5 kg)  Height: 5' 2 (1.575 m)   Physical Exam Cardiovascular:     Rate and Rhythm: Normal rate and regular rhythm.     Heart sounds: Normal heart sounds.  Pulmonary:     Effort: Pulmonary effort is normal.     Breath sounds: Normal breath sounds.  Abdominal:     General: Bowel sounds are normal.     Palpations: Abdomen is soft.  Skin:    General: Skin is warm and dry.  Neurological:     Mental Status: She is alert.     Comments: Oriented to self      I have personally reviewed labs listed below:    Latest Ref Rng & Units 02/28/2024   11:05 AM  CMP  Glucose 70 - 99 mg/dL 90   BUN 8 - 23 mg/dL 23   Creatinine 9.55 - 1.00 mg/dL 9.20   Sodium 864 - 854 mmol/L 143    Potassium 3.5 - 5.1 mmol/L 4.0   Chloride 98 - 111 mmol/L  106   CO2 22 - 32 mmol/L 28   Calcium  8.9 - 10.3 mg/dL 9.6   Total Protein 6.5 - 8.1 g/dL 6.9   Total Bilirubin 0.0 - 1.2 mg/dL 0.3   Alkaline Phos 38 - 126 U/L 74   AST 15 - 41 U/L 22   ALT 0 - 44 U/L 15       Latest Ref Rng & Units 02/28/2024   11:05 AM  CBC  WBC 4.0 - 10.5 K/uL 3.7   Hemoglobin 12.0 - 15.0 g/dL 86.3   Hematocrit 63.9 - 46.0 % 44.2   Platelets 150 - 400 K/uL 117      Assessment and plan- Patient is a 74 y.o. 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  I had last seen her in March 2025 and unfortunately since then her memory seems to have deteriorated significantly.  She does not verbalize much on today's exam but appears comfortable overall. Last chest CT in November 2025 did not show any evidence of progressive disease. Attendance at future appointments uncertain. - CT chest abdomen pelvis with contrast and bone scan in 3 months and I will see her 1 week after scans       Visit Diagnosis 1. Encounter for follow-up surveillance of lung cancer   2. Autoimmune hypothyroidism      Dr. Annah Skene, MD, MPH [facility] [phone] 02/28/2024 1:10 PM                   [1]  Allergies Allergen Reactions   Bee Venom Swelling   Honey Bee Venom Other (See Comments)    Unknown rxn  [2]  Current Outpatient Medications:    amLODipine  (NORVASC ) 5 MG tablet, Take 1 tablet (5 mg total) by mouth daily., Disp: 30 tablet, Rfl: 3   divalproex  (DEPAKOTE ) 250 MG DR tablet, Take 1 tablet (250 mg total) by mouth 2 (two) times daily., Disp: 180 tablet, Rfl: 3   feeding supplement (ENSURE PLUS HIGH PROTEIN) LIQD, Take 237 mLs by  mouth 2 (two) times daily between meals., Disp: 20000 mL, Rfl: 3   levothyroxine  (SYNTHROID ) 112 MCG tablet, Take 1 tablet (112 mcg total) by mouth daily before breakfast., Disp: 90 tablet, Rfl: 3   memantine  (NAMENDA ) 10 MG tablet, Take 1 tablet (10 mg total) by mouth 2 (two) times daily., Disp: 180 tablet, Rfl: 3   Midazolam  (NAYZILAM ) 5 MG/0.1ML SOLN, Place 5 mg into the nose as needed., Disp: 2 each, Rfl: 2   Multiple Vitamin (MULTIVITAMIN) tablet, Take 1 tablet by mouth daily., Disp: 90 tablet, Rfl: 3   permethrin  (ELIMITE ) 5 % cream, Apply from the neck down to soles of feet, wash off after 8-12 hours., Disp: 60 g, Rfl: 1   polyethylene glycol (MIRALAX  / GLYCOLAX ) 17 g packet, Take 17 g by mouth daily as needed for mild constipation or moderate constipation., Disp: 14 each, Rfl: 0   QUEtiapine  (SEROQUEL ) 50 MG tablet, Take 2 tablets (100 mg total) by mouth in the morning., Disp: 180 tablet, Rfl: 1   rivastigmine  (EXELON ) 3 MG capsule, Take 1 capsule (3 mg total) by mouth 2 (two) times daily., Disp: 180 capsule, Rfl: 3   rosuvastatin  (CRESTOR ) 20 MG tablet, Take  1 tablet (20 mg total) by mouth daily., Disp: 90 tablet, Rfl: 3   sertraline  (ZOLOFT ) 100 MG tablet, Take 1 tablet (100 mg total) by mouth at bedtime., Disp: 90 tablet, Rfl: 3   traZODone  (DESYREL ) 50 MG tablet, Take 1 tablet (50 mg total) by mouth at bedtime., Disp: 90 tablet, Rfl: 3   zonisamide  (ZONEGRAN ) 100 MG capsule, Take 1 capsule (100 mg total) by mouth 2 (two) times daily., Disp: 180 capsule, Rfl: 1   triamcinolone  cream (KENALOG ) 0.5 %, Apply 1 Application topically 2 (two) times daily. (Patient not taking: Reported on 01/09/2024), Disp: 30 g, Rfl: 0 No current facility-administered medications for this visit.  Facility-Administered Medications Ordered in Other Visits:    denosumab  (XGEVA ) injection 120 mg, 120 mg, Subcutaneous, Q30 days, Anne Annah BROCKS, MD, 120 mg at 03/05/19 1525  "

## 2024-03-03 ENCOUNTER — Encounter: Payer: Self-pay | Admitting: Oncology

## 2024-03-08 ENCOUNTER — Encounter: Payer: Self-pay | Admitting: Oncology

## 2024-03-08 ENCOUNTER — Other Ambulatory Visit: Payer: Self-pay

## 2024-03-09 ENCOUNTER — Other Ambulatory Visit: Payer: Self-pay

## 2024-03-10 ENCOUNTER — Other Ambulatory Visit: Payer: Self-pay

## 2024-03-11 ENCOUNTER — Other Ambulatory Visit: Payer: Self-pay

## 2024-04-23 ENCOUNTER — Ambulatory Visit: Admitting: Nurse Practitioner

## 2024-04-24 ENCOUNTER — Ambulatory Visit: Admitting: Nurse Practitioner

## 2024-05-27 ENCOUNTER — Other Ambulatory Visit

## 2024-06-05 ENCOUNTER — Inpatient Hospital Stay: Admitting: Oncology

## 2024-11-16 ENCOUNTER — Ambulatory Visit: Admitting: Neurology
